# Patient Record
Sex: Female | Born: 1955
Health system: Southern US, Community
[De-identification: ages and names within clinical notes are randomized; demographics above are authoritative.]

## PROBLEM LIST (undated history)

## (undated) DIAGNOSIS — M47816 Spondylosis without myelopathy or radiculopathy, lumbar region: Secondary | ICD-10-CM

## (undated) DIAGNOSIS — E039 Hypothyroidism, unspecified: Secondary | ICD-10-CM

## (undated) DIAGNOSIS — I480 Paroxysmal atrial fibrillation: Secondary | ICD-10-CM

## (undated) DIAGNOSIS — F32A Depression, unspecified: Secondary | ICD-10-CM

## (undated) DIAGNOSIS — E785 Hyperlipidemia, unspecified: Secondary | ICD-10-CM

## (undated) DIAGNOSIS — K219 Gastro-esophageal reflux disease without esophagitis: Secondary | ICD-10-CM

## (undated) DIAGNOSIS — K7460 Unspecified cirrhosis of liver: Secondary | ICD-10-CM

## (undated) DIAGNOSIS — T7840XA Allergy, unspecified, initial encounter: Secondary | ICD-10-CM

## (undated) DIAGNOSIS — K769 Liver disease, unspecified: Secondary | ICD-10-CM

## (undated) DIAGNOSIS — G4730 Sleep apnea, unspecified: Secondary | ICD-10-CM

## (undated) DIAGNOSIS — R7611 Nonspecific reaction to tuberculin skin test without active tuberculosis: Secondary | ICD-10-CM

## (undated) DIAGNOSIS — Z9989 Dependence on other enabling machines and devices: Secondary | ICD-10-CM

## (undated) DIAGNOSIS — I10 Essential (primary) hypertension: Secondary | ICD-10-CM

## (undated) DIAGNOSIS — H269 Unspecified cataract: Secondary | ICD-10-CM

## (undated) DIAGNOSIS — F329 Major depressive disorder, single episode, unspecified: Secondary | ICD-10-CM

## (undated) DIAGNOSIS — I5042 Chronic combined systolic (congestive) and diastolic (congestive) heart failure: Secondary | ICD-10-CM

## (undated) DIAGNOSIS — C50919 Malignant neoplasm of unspecified site of unspecified female breast: Secondary | ICD-10-CM

## (undated) DIAGNOSIS — M1990 Unspecified osteoarthritis, unspecified site: Secondary | ICD-10-CM

## (undated) DIAGNOSIS — I82409 Acute embolism and thrombosis of unspecified deep veins of unspecified lower extremity: Secondary | ICD-10-CM

## (undated) DIAGNOSIS — G4733 Obstructive sleep apnea (adult) (pediatric): Secondary | ICD-10-CM

## (undated) DIAGNOSIS — D649 Anemia, unspecified: Secondary | ICD-10-CM

## (undated) DIAGNOSIS — R5381 Other malaise: Secondary | ICD-10-CM

## (undated) DIAGNOSIS — C569 Malignant neoplasm of unspecified ovary: Secondary | ICD-10-CM

## (undated) DIAGNOSIS — K8020 Calculus of gallbladder without cholecystitis without obstruction: Secondary | ICD-10-CM

## (undated) HISTORY — DX: Calculus of gallbladder without cholecystitis without obstruction: K80.20

## (undated) HISTORY — PX: PERCUTANEOUS LIVER BIOPSY: SUR136

## (undated) HISTORY — DX: Major depressive disorder, single episode, unspecified: F32.9

## (undated) HISTORY — DX: Liver disease, unspecified: K76.9

## (undated) HISTORY — DX: Gastro-esophageal reflux disease without esophagitis: K21.9

## (undated) HISTORY — DX: Dependence on other enabling machines and devices: Z99.89

## (undated) HISTORY — DX: Essential (primary) hypertension: I10

## (undated) HISTORY — PX: CHOLECYSTECTOMY: SHX55

## (undated) HISTORY — DX: Chronic combined systolic (congestive) and diastolic (congestive) heart failure: I50.42

## (undated) HISTORY — PX: BREAST BIOPSY: SHX20

## (undated) HISTORY — PX: TUBAL LIGATION: SHX77

## (undated) HISTORY — DX: Malignant neoplasm of unspecified ovary: C56.9

## (undated) HISTORY — DX: Acute embolism and thrombosis of unspecified deep veins of unspecified lower extremity: I82.409

## (undated) HISTORY — DX: Nonspecific reaction to tuberculin skin test without active tuberculosis: R76.11

## (undated) HISTORY — DX: Hypothyroidism, unspecified: E03.9

## (undated) HISTORY — DX: Sleep apnea, unspecified: G47.30

## (undated) HISTORY — DX: Depression, unspecified: F32.A

## (undated) HISTORY — DX: Paroxysmal atrial fibrillation: I48.0

## (undated) HISTORY — DX: Hyperlipidemia, unspecified: E78.5

## (undated) HISTORY — DX: Unspecified osteoarthritis, unspecified site: M19.90

## (undated) HISTORY — DX: Obstructive sleep apnea (adult) (pediatric): G47.33

## (undated) HISTORY — PX: EYE SURGERY: SHX253

## (undated) HISTORY — PX: PEG TUBE PLACEMENT: SUR1034

## (undated) HISTORY — DX: Unspecified cirrhosis of liver: K74.60

## (undated) HISTORY — PX: FOOT SURGERY: SHX648

## (undated) HISTORY — PX: CATARACT EXTRACTION: SUR2

---

## 2000-05-12 HISTORY — PX: BREAST EXCISIONAL BIOPSY: SUR124

## 2000-11-09 ENCOUNTER — Encounter: Admission: RE | Admit: 2000-11-09 | Discharge: 2000-11-09 | Payer: Self-pay | Admitting: *Deleted

## 2000-11-09 ENCOUNTER — Encounter (INDEPENDENT_AMBULATORY_CARE_PROVIDER_SITE_OTHER): Payer: Self-pay | Admitting: *Deleted

## 2000-11-09 ENCOUNTER — Ambulatory Visit (HOSPITAL_BASED_OUTPATIENT_CLINIC_OR_DEPARTMENT_OTHER): Admission: RE | Admit: 2000-11-09 | Discharge: 2000-11-09 | Payer: Self-pay | Admitting: *Deleted

## 2001-09-10 ENCOUNTER — Other Ambulatory Visit: Admission: RE | Admit: 2001-09-10 | Discharge: 2001-09-10 | Payer: Self-pay | Admitting: Family Medicine

## 2001-09-17 ENCOUNTER — Encounter: Admission: RE | Admit: 2001-09-17 | Discharge: 2001-09-17 | Payer: Self-pay | Admitting: Family Medicine

## 2001-09-17 ENCOUNTER — Encounter: Payer: Self-pay | Admitting: Family Medicine

## 2002-11-16 ENCOUNTER — Other Ambulatory Visit: Admission: RE | Admit: 2002-11-16 | Discharge: 2002-11-16 | Payer: Self-pay | Admitting: Family Medicine

## 2002-11-16 ENCOUNTER — Encounter: Payer: Self-pay | Admitting: Family Medicine

## 2002-11-16 ENCOUNTER — Encounter: Admission: RE | Admit: 2002-11-16 | Discharge: 2002-11-16 | Payer: Self-pay | Admitting: Family Medicine

## 2003-05-13 ENCOUNTER — Encounter: Payer: Self-pay | Admitting: Internal Medicine

## 2003-05-13 LAB — CONVERTED CEMR LAB

## 2003-11-23 ENCOUNTER — Other Ambulatory Visit: Admission: RE | Admit: 2003-11-23 | Discharge: 2003-11-23 | Payer: Self-pay | Admitting: Family Medicine

## 2003-11-23 ENCOUNTER — Encounter: Admission: RE | Admit: 2003-11-23 | Discharge: 2003-11-23 | Payer: Self-pay | Admitting: Family Medicine

## 2003-11-23 ENCOUNTER — Encounter: Payer: Self-pay | Admitting: Internal Medicine

## 2004-06-07 ENCOUNTER — Encounter: Payer: Self-pay | Admitting: Internal Medicine

## 2004-06-19 ENCOUNTER — Ambulatory Visit: Payer: Self-pay | Admitting: Family Medicine

## 2004-07-26 ENCOUNTER — Ambulatory Visit: Payer: Self-pay | Admitting: Family Medicine

## 2004-08-27 ENCOUNTER — Ambulatory Visit: Payer: Self-pay | Admitting: Family Medicine

## 2004-09-02 ENCOUNTER — Ambulatory Visit: Payer: Self-pay | Admitting: Family Medicine

## 2004-09-12 ENCOUNTER — Ambulatory Visit (HOSPITAL_COMMUNITY): Admission: RE | Admit: 2004-09-12 | Discharge: 2004-09-12 | Payer: Self-pay | Admitting: Infectious Diseases

## 2004-11-14 ENCOUNTER — Ambulatory Visit: Payer: Self-pay | Admitting: Family Medicine

## 2004-12-08 ENCOUNTER — Inpatient Hospital Stay (HOSPITAL_COMMUNITY): Admission: EM | Admit: 2004-12-08 | Discharge: 2004-12-10 | Payer: Self-pay | Admitting: Emergency Medicine

## 2004-12-08 ENCOUNTER — Ambulatory Visit: Payer: Self-pay | Admitting: Endocrinology

## 2004-12-12 ENCOUNTER — Ambulatory Visit: Payer: Self-pay | Admitting: Cardiology

## 2004-12-17 ENCOUNTER — Ambulatory Visit: Payer: Self-pay | Admitting: Family Medicine

## 2004-12-17 ENCOUNTER — Ambulatory Visit: Admission: RE | Admit: 2004-12-17 | Discharge: 2004-12-17 | Payer: Self-pay | Admitting: Family Medicine

## 2004-12-17 ENCOUNTER — Ambulatory Visit: Payer: Self-pay | Admitting: Cardiology

## 2004-12-20 ENCOUNTER — Ambulatory Visit: Payer: Self-pay | Admitting: Family Medicine

## 2004-12-23 ENCOUNTER — Ambulatory Visit: Payer: Self-pay | Admitting: Cardiology

## 2004-12-27 ENCOUNTER — Ambulatory Visit: Payer: Self-pay | Admitting: Family Medicine

## 2005-01-07 ENCOUNTER — Ambulatory Visit: Payer: Self-pay | Admitting: Cardiovascular Disease

## 2005-01-20 ENCOUNTER — Ambulatory Visit: Payer: Self-pay | Admitting: Family Medicine

## 2005-01-24 ENCOUNTER — Ambulatory Visit: Payer: Self-pay | Admitting: *Deleted

## 2005-02-13 ENCOUNTER — Ambulatory Visit: Payer: Self-pay | Admitting: Internal Medicine

## 2005-02-13 ENCOUNTER — Ambulatory Visit: Payer: Self-pay | Admitting: Cardiology

## 2005-02-26 ENCOUNTER — Encounter: Admission: RE | Admit: 2005-02-26 | Discharge: 2005-02-26 | Payer: Self-pay | Admitting: Family Medicine

## 2005-04-07 ENCOUNTER — Ambulatory Visit: Payer: Self-pay | Admitting: Cardiology

## 2005-05-06 ENCOUNTER — Ambulatory Visit: Payer: Self-pay | Admitting: Internal Medicine

## 2005-05-15 ENCOUNTER — Ambulatory Visit: Payer: Self-pay | Admitting: Internal Medicine

## 2005-05-19 ENCOUNTER — Ambulatory Visit: Payer: Self-pay | Admitting: Internal Medicine

## 2005-05-29 ENCOUNTER — Ambulatory Visit: Payer: Self-pay | Admitting: Cardiology

## 2005-06-16 ENCOUNTER — Ambulatory Visit: Payer: Self-pay | Admitting: Internal Medicine

## 2005-06-26 ENCOUNTER — Ambulatory Visit: Payer: Self-pay | Admitting: Internal Medicine

## 2005-07-07 ENCOUNTER — Ambulatory Visit: Payer: Self-pay | Admitting: Internal Medicine

## 2005-07-21 ENCOUNTER — Ambulatory Visit: Payer: Self-pay | Admitting: Internal Medicine

## 2005-08-18 ENCOUNTER — Ambulatory Visit: Payer: Self-pay | Admitting: Internal Medicine

## 2005-10-28 ENCOUNTER — Ambulatory Visit: Payer: Self-pay | Admitting: Internal Medicine

## 2005-12-18 ENCOUNTER — Ambulatory Visit: Payer: Self-pay | Admitting: Internal Medicine

## 2006-03-05 ENCOUNTER — Encounter: Admission: RE | Admit: 2006-03-05 | Discharge: 2006-03-05 | Payer: Self-pay | Admitting: Internal Medicine

## 2006-03-05 LAB — HM MAMMOGRAPHY

## 2006-04-28 ENCOUNTER — Ambulatory Visit: Payer: Self-pay | Admitting: Family Medicine

## 2006-04-28 LAB — CONVERTED CEMR LAB
ALT: 50 units/L — ABNORMAL HIGH (ref 0–40)
Potassium: 4 meq/L (ref 3.5–5.1)

## 2006-05-21 ENCOUNTER — Ambulatory Visit: Payer: Self-pay | Admitting: Internal Medicine

## 2006-06-11 ENCOUNTER — Encounter: Admission: RE | Admit: 2006-06-11 | Discharge: 2006-06-11 | Payer: Self-pay | Admitting: Internal Medicine

## 2006-06-11 ENCOUNTER — Encounter (INDEPENDENT_AMBULATORY_CARE_PROVIDER_SITE_OTHER): Payer: Self-pay | Admitting: *Deleted

## 2006-09-10 ENCOUNTER — Ambulatory Visit: Payer: Self-pay | Admitting: Internal Medicine

## 2006-09-10 LAB — CONVERTED CEMR LAB
AST: 51 units/L — ABNORMAL HIGH (ref 0–37)
Albumin: 3.6 g/dL (ref 3.5–5.2)
HDL: 29.4 mg/dL — ABNORMAL LOW (ref >39.0)
Total Bilirubin: 0.7 mg/dL (ref 0.3–1.2)
Total Protein: 6.9 g/dL (ref 6.0–8.3)
VLDL: 27 mg/dL (ref 0–40)

## 2006-09-17 ENCOUNTER — Ambulatory Visit: Payer: Self-pay | Admitting: Internal Medicine

## 2006-09-17 ENCOUNTER — Ambulatory Visit: Payer: Self-pay

## 2006-10-06 ENCOUNTER — Encounter: Payer: Self-pay | Admitting: Internal Medicine

## 2006-10-06 DIAGNOSIS — K219 Gastro-esophageal reflux disease without esophagitis: Secondary | ICD-10-CM

## 2006-10-06 DIAGNOSIS — F329 Major depressive disorder, single episode, unspecified: Secondary | ICD-10-CM

## 2006-10-06 DIAGNOSIS — E785 Hyperlipidemia, unspecified: Secondary | ICD-10-CM | POA: Insufficient documentation

## 2006-10-06 DIAGNOSIS — Z86718 Personal history of other venous thrombosis and embolism: Secondary | ICD-10-CM | POA: Insufficient documentation

## 2006-11-27 ENCOUNTER — Ambulatory Visit: Payer: Self-pay | Admitting: Internal Medicine

## 2006-12-09 ENCOUNTER — Encounter: Payer: Self-pay | Admitting: Internal Medicine

## 2006-12-15 ENCOUNTER — Telehealth: Payer: Self-pay | Admitting: Internal Medicine

## 2006-12-15 LAB — CONVERTED CEMR LAB
AST: 44 units/L — ABNORMAL HIGH (ref 0–37)
Albumin: 4.2 g/dL (ref 3.5–5.2)
Alkaline Phosphatase: 154 units/L — ABNORMAL HIGH (ref 39–117)
BUN: 11 mg/dL (ref 6–23)
Bilirubin, Direct: 0.1 mg/dL (ref 0.0–0.3)
Calcium: 9.2 mg/dL (ref 8.4–10.5)
Chloride: 103 meq/L (ref 96–112)
Cholesterol: 146 mg/dL (ref 0–200)
Creatinine, Ser: 0.84 mg/dL (ref 0.40–1.20)
HDL: 34 mg/dL — ABNORMAL LOW (ref >39)
Indirect Bilirubin: 0.3 mg/dL (ref 0.0–0.9)
LDL Cholesterol: 86 mg/dL (ref 0–99)
Potassium: 4.2 meq/L (ref 3.5–5.3)
Total CHOL/HDL Ratio: 4.3
Vit D, 1,25-Dihydroxy: 14 — ABNORMAL LOW (ref 20–57)

## 2006-12-16 ENCOUNTER — Encounter: Payer: Self-pay | Admitting: Internal Medicine

## 2007-01-04 ENCOUNTER — Ambulatory Visit: Payer: Self-pay | Admitting: Internal Medicine

## 2007-01-12 ENCOUNTER — Telehealth: Payer: Self-pay | Admitting: Internal Medicine

## 2007-01-21 ENCOUNTER — Ambulatory Visit (HOSPITAL_BASED_OUTPATIENT_CLINIC_OR_DEPARTMENT_OTHER): Admission: RE | Admit: 2007-01-21 | Discharge: 2007-01-21 | Payer: Self-pay | Admitting: Internal Medicine

## 2007-01-28 ENCOUNTER — Telehealth: Payer: Self-pay | Admitting: Internal Medicine

## 2007-02-01 ENCOUNTER — Ambulatory Visit: Payer: Self-pay | Admitting: Internal Medicine

## 2007-02-02 ENCOUNTER — Ambulatory Visit: Payer: Self-pay | Admitting: Internal Medicine

## 2007-02-18 ENCOUNTER — Ambulatory Visit: Payer: Self-pay | Admitting: Internal Medicine

## 2007-02-22 LAB — CONVERTED CEMR LAB: TSH: 7.43 microintl units/mL — ABNORMAL HIGH (ref 0.35–5.50)

## 2007-02-23 LAB — CONVERTED CEMR LAB: Vit D, 1,25-Dihydroxy: 27 — ABNORMAL LOW (ref 30–89)

## 2007-03-01 ENCOUNTER — Telehealth: Payer: Self-pay | Admitting: Internal Medicine

## 2007-03-08 ENCOUNTER — Encounter: Admission: RE | Admit: 2007-03-08 | Discharge: 2007-03-08 | Payer: Self-pay | Admitting: Internal Medicine

## 2007-03-18 ENCOUNTER — Telehealth: Payer: Self-pay | Admitting: Internal Medicine

## 2007-03-24 ENCOUNTER — Ambulatory Visit: Payer: Self-pay | Admitting: Internal Medicine

## 2007-03-24 DIAGNOSIS — G4733 Obstructive sleep apnea (adult) (pediatric): Secondary | ICD-10-CM

## 2007-03-24 DIAGNOSIS — E039 Hypothyroidism, unspecified: Secondary | ICD-10-CM | POA: Insufficient documentation

## 2007-03-30 DIAGNOSIS — M129 Arthropathy, unspecified: Secondary | ICD-10-CM | POA: Insufficient documentation

## 2007-04-09 ENCOUNTER — Ambulatory Visit: Payer: Self-pay | Admitting: Internal Medicine

## 2007-04-12 ENCOUNTER — Telehealth: Payer: Self-pay | Admitting: *Deleted

## 2007-04-22 ENCOUNTER — Telehealth: Payer: Self-pay | Admitting: *Deleted

## 2007-04-27 ENCOUNTER — Telehealth: Payer: Self-pay | Admitting: Internal Medicine

## 2007-05-11 ENCOUNTER — Telehealth: Payer: Self-pay | Admitting: Internal Medicine

## 2007-06-07 ENCOUNTER — Telehealth: Payer: Self-pay | Admitting: Internal Medicine

## 2007-06-25 ENCOUNTER — Ambulatory Visit: Payer: Self-pay | Admitting: Internal Medicine

## 2007-06-25 LAB — CONVERTED CEMR LAB
Creatinine, Ser: 0.9 mg/dL (ref 0.4–1.2)
GFR calc Af Amer: 84 mL/min
GFR calc non Af Amer: 70 mL/min
Glucose, Bld: 85 mg/dL (ref 70–99)
Hgb A1c MFr Bld: 6.3 % — ABNORMAL HIGH (ref 4.6–6.0)
Potassium: 4.3 meq/L (ref 3.5–5.1)

## 2007-06-30 ENCOUNTER — Ambulatory Visit: Payer: Self-pay | Admitting: Internal Medicine

## 2007-07-13 ENCOUNTER — Telehealth: Payer: Self-pay | Admitting: Internal Medicine

## 2007-07-26 ENCOUNTER — Ambulatory Visit: Payer: Self-pay | Admitting: Internal Medicine

## 2007-08-09 ENCOUNTER — Telehealth: Payer: Self-pay | Admitting: Internal Medicine

## 2007-08-13 ENCOUNTER — Telehealth: Payer: Self-pay | Admitting: Internal Medicine

## 2007-08-25 ENCOUNTER — Ambulatory Visit: Payer: Self-pay | Admitting: Internal Medicine

## 2007-09-01 ENCOUNTER — Telehealth: Payer: Self-pay | Admitting: *Deleted

## 2007-09-01 LAB — CONVERTED CEMR LAB: TSH: 5.41 microintl units/mL (ref 0.35–5.50)

## 2007-09-06 ENCOUNTER — Telehealth: Payer: Self-pay | Admitting: Internal Medicine

## 2007-09-07 ENCOUNTER — Encounter: Payer: Self-pay | Admitting: Family Medicine

## 2007-09-07 ENCOUNTER — Telehealth: Payer: Self-pay | Admitting: *Deleted

## 2007-09-15 ENCOUNTER — Ambulatory Visit: Payer: Self-pay | Admitting: Internal Medicine

## 2007-10-25 ENCOUNTER — Telehealth: Payer: Self-pay | Admitting: *Deleted

## 2007-11-22 ENCOUNTER — Telehealth: Payer: Self-pay | Admitting: Internal Medicine

## 2007-11-24 ENCOUNTER — Telehealth: Payer: Self-pay | Admitting: Internal Medicine

## 2007-11-24 ENCOUNTER — Telehealth: Payer: Self-pay | Admitting: *Deleted

## 2007-11-24 ENCOUNTER — Ambulatory Visit: Payer: Self-pay | Admitting: Internal Medicine

## 2007-11-24 LAB — CONVERTED CEMR LAB
Blood in Urine, dipstick: NEGATIVE
Glucose, Urine, Semiquant: NEGATIVE
Nitrite: POSITIVE
TSH: 2.48 microintl units/mL (ref 0.35–5.50)

## 2007-11-25 ENCOUNTER — Encounter: Payer: Self-pay | Admitting: Internal Medicine

## 2007-11-26 ENCOUNTER — Telehealth: Payer: Self-pay | Admitting: Internal Medicine

## 2007-12-01 ENCOUNTER — Ambulatory Visit: Payer: Self-pay | Admitting: Internal Medicine

## 2007-12-01 DIAGNOSIS — R05 Cough: Secondary | ICD-10-CM

## 2007-12-06 ENCOUNTER — Ambulatory Visit: Payer: Self-pay | Admitting: Internal Medicine

## 2008-02-09 ENCOUNTER — Telehealth: Payer: Self-pay | Admitting: Internal Medicine

## 2008-03-16 ENCOUNTER — Telehealth: Payer: Self-pay | Admitting: *Deleted

## 2008-05-12 LAB — HM DIABETES EYE EXAM: HM Diabetic Eye Exam: NORMAL

## 2008-05-17 ENCOUNTER — Telehealth: Payer: Self-pay | Admitting: Internal Medicine

## 2008-06-09 ENCOUNTER — Ambulatory Visit: Payer: Self-pay | Admitting: Internal Medicine

## 2008-06-11 ENCOUNTER — Encounter: Payer: Self-pay | Admitting: Internal Medicine

## 2008-06-11 LAB — CONVERTED CEMR LAB
Bilirubin, Direct: 0.1 mg/dL (ref 0.0–0.3)
HDL: 32.1 mg/dL — ABNORMAL LOW (ref >39.0)
LDL Cholesterol: 104 mg/dL — ABNORMAL HIGH (ref 0–99)
TSH: 5.88 microintl units/mL — ABNORMAL HIGH (ref 0.35–5.50)
Total Bilirubin: 0.7 mg/dL (ref 0.3–1.2)
Total CHOL/HDL Ratio: 4.9
Total Protein: 7.4 g/dL (ref 6.0–8.3)

## 2008-06-14 ENCOUNTER — Ambulatory Visit: Payer: Self-pay | Admitting: Internal Medicine

## 2008-06-20 ENCOUNTER — Telehealth: Payer: Self-pay | Admitting: *Deleted

## 2008-06-26 ENCOUNTER — Telehealth: Payer: Self-pay | Admitting: Internal Medicine

## 2008-08-09 ENCOUNTER — Ambulatory Visit: Payer: Self-pay | Admitting: Internal Medicine

## 2008-08-09 LAB — CONVERTED CEMR LAB
ALT: 85 units/L — ABNORMAL HIGH (ref 0–35)
ALT: 78 units/L — ABNORMAL HIGH (ref 0–35)
AST: 90 units/L — ABNORMAL HIGH (ref 0–37)
Alkaline Phosphatase: 231 units/L — ABNORMAL HIGH (ref 39–117)
Anti Nuclear Antibody(ANA): POSITIVE — AB
Bilirubin, Direct: 0.1 mg/dL (ref 0.0–0.3)
Bilirubin, Direct: 0.1 mg/dL (ref 0.0–0.3)
Total Bilirubin: 0.6 mg/dL (ref 0.3–1.2)
Total Bilirubin: 0.4 mg/dL (ref 0.3–1.2)
Total Protein: 7.5 g/dL (ref 6.0–8.3)
Total Protein: 7.4 g/dL (ref 6.0–8.3)

## 2008-08-16 ENCOUNTER — Ambulatory Visit: Payer: Self-pay | Admitting: Internal Medicine

## 2008-08-16 DIAGNOSIS — R5383 Other fatigue: Secondary | ICD-10-CM

## 2008-08-16 DIAGNOSIS — E559 Vitamin D deficiency, unspecified: Secondary | ICD-10-CM | POA: Insufficient documentation

## 2008-08-16 DIAGNOSIS — R5381 Other malaise: Secondary | ICD-10-CM | POA: Insufficient documentation

## 2008-08-16 LAB — CONVERTED CEMR LAB
Basophils Relative: 1.3 % (ref 0.0–3.0)
CO2: 26 meq/L (ref 19–32)
CRP, High Sensitivity: 9 — ABNORMAL HIGH (ref 0.00–5.00)
Ceruloplasmin: 49 mg/dL (ref 21–63)
Chloride: 100 meq/L (ref 96–112)
Eosinophils Absolute: 0.1 10*3/uL (ref 0.0–0.7)
Eosinophils Relative: 1.1 % (ref 0.0–5.0)
GGT: 198 units/L — ABNORMAL HIGH (ref 7–51)
HCT: 37.3 % (ref 36.0–46.0)
INR: 1 (ref 0.8–1.0)
Iron: 50 ug/dL (ref 42–145)
Lymphocytes Relative: 28.4 % (ref 12.0–46.0)
MCHC: 33.7 g/dL (ref 30.0–36.0)
Monocytes Relative: 3 % (ref 3.0–12.0)
Neutro Abs: 4.1 10*3/uL (ref 1.4–7.7)
Platelets: 211 10*3/uL (ref 150.0–400.0)
Prothrombin Time: 11.2 s (ref 10.9–13.3)
RBC: 4.61 M/uL (ref 3.87–5.11)
Sed Rate: 18 mm/hr (ref 0–22)
Transferrin: 376.6 mg/dL — ABNORMAL HIGH (ref 212.0–360.0)

## 2008-08-18 ENCOUNTER — Telehealth: Payer: Self-pay | Admitting: Internal Medicine

## 2008-08-28 ENCOUNTER — Telehealth: Payer: Self-pay | Admitting: Internal Medicine

## 2008-09-20 ENCOUNTER — Telehealth: Payer: Self-pay | Admitting: Internal Medicine

## 2008-09-21 ENCOUNTER — Telehealth: Payer: Self-pay | Admitting: *Deleted

## 2008-09-28 ENCOUNTER — Telehealth: Payer: Self-pay | Admitting: Internal Medicine

## 2008-09-28 DIAGNOSIS — K760 Fatty (change of) liver, not elsewhere classified: Secondary | ICD-10-CM | POA: Insufficient documentation

## 2008-09-28 DIAGNOSIS — R161 Splenomegaly, not elsewhere classified: Secondary | ICD-10-CM

## 2008-09-29 ENCOUNTER — Telehealth: Payer: Self-pay | Admitting: Family Medicine

## 2008-10-04 ENCOUNTER — Ambulatory Visit: Payer: Self-pay | Admitting: Internal Medicine

## 2008-10-04 ENCOUNTER — Ambulatory Visit (HOSPITAL_COMMUNITY): Admission: RE | Admit: 2008-10-04 | Discharge: 2008-10-04 | Payer: Self-pay | Admitting: Internal Medicine

## 2008-10-06 ENCOUNTER — Telehealth: Payer: Self-pay | Admitting: *Deleted

## 2008-10-16 ENCOUNTER — Ambulatory Visit: Payer: Self-pay | Admitting: Internal Medicine

## 2008-10-16 DIAGNOSIS — F430 Acute stress reaction: Secondary | ICD-10-CM | POA: Insufficient documentation

## 2008-10-25 ENCOUNTER — Ambulatory Visit: Payer: Self-pay | Admitting: Licensed Clinical Social Worker

## 2008-11-01 ENCOUNTER — Ambulatory Visit: Payer: Self-pay | Admitting: Licensed Clinical Social Worker

## 2008-11-07 ENCOUNTER — Ambulatory Visit: Payer: Self-pay | Admitting: Internal Medicine

## 2008-11-07 ENCOUNTER — Encounter: Admission: RE | Admit: 2008-11-07 | Discharge: 2008-11-07 | Payer: Self-pay | Admitting: Internal Medicine

## 2008-11-09 ENCOUNTER — Ambulatory Visit: Payer: Self-pay | Admitting: Licensed Clinical Social Worker

## 2008-11-17 ENCOUNTER — Ambulatory Visit: Payer: Self-pay | Admitting: Internal Medicine

## 2008-11-17 LAB — CONVERTED CEMR LAB: Blood Glucose, Fingerstick: 164

## 2008-11-20 LAB — CONVERTED CEMR LAB: TSH: 8.829 microintl units/mL — ABNORMAL HIGH (ref 0.350–4.500)

## 2008-11-22 ENCOUNTER — Ambulatory Visit: Payer: Self-pay | Admitting: Internal Medicine

## 2008-11-22 ENCOUNTER — Ambulatory Visit: Payer: Self-pay | Admitting: Licensed Clinical Social Worker

## 2008-11-29 ENCOUNTER — Ambulatory Visit (HOSPITAL_COMMUNITY): Admission: RE | Admit: 2008-11-29 | Discharge: 2008-11-29 | Payer: Self-pay | Admitting: Internal Medicine

## 2008-11-29 ENCOUNTER — Encounter: Payer: Self-pay | Admitting: Internal Medicine

## 2008-11-29 ENCOUNTER — Encounter (INDEPENDENT_AMBULATORY_CARE_PROVIDER_SITE_OTHER): Payer: Self-pay | Admitting: Interventional Radiology

## 2008-12-12 ENCOUNTER — Telehealth: Payer: Self-pay | Admitting: Internal Medicine

## 2008-12-15 ENCOUNTER — Telehealth: Payer: Self-pay | Admitting: *Deleted

## 2008-12-26 ENCOUNTER — Ambulatory Visit: Payer: Self-pay | Admitting: Internal Medicine

## 2008-12-26 LAB — CONVERTED CEMR LAB
ALT: 47 units/L — ABNORMAL HIGH (ref 0–35)
Albumin: 3.3 g/dL — ABNORMAL LOW (ref 3.5–5.2)
Alkaline Phosphatase: 189 units/L — ABNORMAL HIGH (ref 39–117)
Bilirubin, Direct: 0.1 mg/dL (ref 0.0–0.3)
Total Protein: 7.1 g/dL (ref 6.0–8.3)

## 2009-01-04 ENCOUNTER — Ambulatory Visit: Payer: Self-pay | Admitting: Internal Medicine

## 2009-01-05 ENCOUNTER — Ambulatory Visit: Payer: Self-pay | Admitting: Internal Medicine

## 2009-01-05 LAB — CONVERTED CEMR LAB: Blood Glucose, Fingerstick: 239

## 2009-01-10 ENCOUNTER — Telehealth: Payer: Self-pay | Admitting: *Deleted

## 2009-01-16 ENCOUNTER — Telehealth: Payer: Self-pay | Admitting: *Deleted

## 2009-01-17 ENCOUNTER — Telehealth: Payer: Self-pay | Admitting: *Deleted

## 2009-01-18 ENCOUNTER — Telehealth: Payer: Self-pay | Admitting: Internal Medicine

## 2009-01-26 ENCOUNTER — Ambulatory Visit: Payer: Self-pay | Admitting: Internal Medicine

## 2009-01-26 LAB — CONVERTED CEMR LAB
AST: 88 units/L — ABNORMAL HIGH (ref 0–37)
Albumin: 3.3 g/dL — ABNORMAL LOW (ref 3.5–5.2)
Basophils Absolute: 0 10*3/uL (ref 0.0–0.1)
Bilirubin, Direct: 0.2 mg/dL (ref 0.0–0.3)
Eosinophils Relative: 2.5 % (ref 0.0–5.0)
INR: 1.1 — ABNORMAL HIGH (ref 0.8–1.0)
Iron: 38 ug/dL — ABNORMAL LOW (ref 42–145)
Lymphocytes Relative: 30.5 % (ref 12.0–46.0)
Lymphs Abs: 1.7 10*3/uL (ref 0.7–4.0)
MCV: 84.5 fL (ref 78.0–100.0)
Monocytes Relative: 5.5 % (ref 3.0–12.0)
Neutrophils Relative %: 60.7 % (ref 43.0–77.0)
Prothrombin Time: 11.4 s (ref 9.1–11.7)
RBC: 4.22 M/uL (ref 3.87–5.11)
RDW: 13.9 % (ref 11.5–14.6)
Saturation Ratios: 7.3 % — ABNORMAL LOW (ref 20.0–50.0)

## 2009-01-31 ENCOUNTER — Telehealth: Payer: Self-pay | Admitting: Internal Medicine

## 2009-02-01 ENCOUNTER — Emergency Department (HOSPITAL_COMMUNITY): Admission: EM | Admit: 2009-02-01 | Discharge: 2009-02-01 | Payer: Self-pay | Admitting: Family Medicine

## 2009-02-02 ENCOUNTER — Ambulatory Visit: Payer: Self-pay | Admitting: Internal Medicine

## 2009-02-02 LAB — CONVERTED CEMR LAB: Blood Glucose, Fingerstick: 186

## 2009-02-07 LAB — CONVERTED CEMR LAB: IgG (Immunoglobin G), Serum: 1370 mg/dL (ref 694–1618)

## 2009-02-12 ENCOUNTER — Telehealth: Payer: Self-pay | Admitting: Internal Medicine

## 2009-02-12 ENCOUNTER — Encounter: Payer: Self-pay | Admitting: Internal Medicine

## 2009-02-23 ENCOUNTER — Ambulatory Visit: Payer: Self-pay | Admitting: Endocrinology

## 2009-02-23 DIAGNOSIS — R609 Edema, unspecified: Secondary | ICD-10-CM

## 2009-02-26 ENCOUNTER — Encounter: Payer: Self-pay | Admitting: Internal Medicine

## 2009-03-07 ENCOUNTER — Ambulatory Visit: Payer: Self-pay | Admitting: Internal Medicine

## 2009-03-12 LAB — CONVERTED CEMR LAB
Alkaline Phosphatase: 188 units/L — ABNORMAL HIGH (ref 39–117)
Bilirubin, Direct: 0.1 mg/dL (ref 0.0–0.3)
Total Protein: 7.3 g/dL (ref 6.0–8.3)

## 2009-03-23 ENCOUNTER — Telehealth: Payer: Self-pay | Admitting: Internal Medicine

## 2009-04-02 ENCOUNTER — Telehealth: Payer: Self-pay | Admitting: *Deleted

## 2009-04-03 ENCOUNTER — Ambulatory Visit: Payer: Self-pay | Admitting: Internal Medicine

## 2009-04-11 ENCOUNTER — Telehealth: Payer: Self-pay | Admitting: *Deleted

## 2009-04-18 LAB — CONVERTED CEMR LAB
ALT: 35 units/L (ref 0–35)
AST: 62 units/L — ABNORMAL HIGH (ref 0–37)
Albumin: 3.5 g/dL (ref 3.5–5.2)
TSH: 3.87 microintl units/mL (ref 0.35–5.50)

## 2009-04-27 ENCOUNTER — Telehealth: Payer: Self-pay | Admitting: Internal Medicine

## 2009-05-29 ENCOUNTER — Telehealth: Payer: Self-pay | Admitting: Internal Medicine

## 2009-05-30 ENCOUNTER — Telehealth: Payer: Self-pay | Admitting: Internal Medicine

## 2009-06-01 ENCOUNTER — Ambulatory Visit: Payer: Self-pay | Admitting: Internal Medicine

## 2009-06-01 DIAGNOSIS — F438 Other reactions to severe stress: Secondary | ICD-10-CM

## 2009-06-04 ENCOUNTER — Telehealth: Payer: Self-pay | Admitting: *Deleted

## 2009-06-08 ENCOUNTER — Ambulatory Visit: Payer: Self-pay | Admitting: Internal Medicine

## 2009-06-08 DIAGNOSIS — D509 Iron deficiency anemia, unspecified: Secondary | ICD-10-CM | POA: Insufficient documentation

## 2009-06-11 ENCOUNTER — Ambulatory Visit: Payer: Self-pay | Admitting: Internal Medicine

## 2009-06-11 ENCOUNTER — Telehealth: Payer: Self-pay | Admitting: *Deleted

## 2009-06-11 LAB — CONVERTED CEMR LAB
ALT: 22 units/L (ref 0–35)
AST: 39 units/L — ABNORMAL HIGH (ref 0–37)
Albumin: 4.2 g/dL (ref 3.5–5.2)
Alkaline Phosphatase: 193 units/L — ABNORMAL HIGH (ref 39–117)
Anti Nuclear Antibody(ANA): POSITIVE — AB
BUN: 6 mg/dL (ref 6–23)
Glucose, Bld: 129 mg/dL — ABNORMAL HIGH (ref 70–99)
HDL: 44 mg/dL (ref >39)
Hgb A1c MFr Bld: 6.8 % — ABNORMAL HIGH (ref 4.6–6.1)
Total Bilirubin: 0.8 mg/dL (ref 0.3–1.2)
Total CHOL/HDL Ratio: 5.3
Total Protein: 7.2 g/dL (ref 6.0–8.3)
VLDL: 18 mg/dL (ref 0–40)

## 2009-06-13 ENCOUNTER — Telehealth: Payer: Self-pay | Admitting: Internal Medicine

## 2009-06-13 ENCOUNTER — Encounter: Payer: Self-pay | Admitting: Internal Medicine

## 2009-06-13 ENCOUNTER — Encounter: Payer: Self-pay | Admitting: *Deleted

## 2009-07-02 ENCOUNTER — Ambulatory Visit: Payer: Self-pay | Admitting: Internal Medicine

## 2009-08-01 ENCOUNTER — Telehealth: Payer: Self-pay | Admitting: Internal Medicine

## 2009-08-03 ENCOUNTER — Ambulatory Visit: Payer: Self-pay | Admitting: Internal Medicine

## 2009-09-06 ENCOUNTER — Ambulatory Visit: Payer: Self-pay | Admitting: Internal Medicine

## 2009-09-06 DIAGNOSIS — T50995A Adverse effect of other drugs, medicaments and biological substances, initial encounter: Secondary | ICD-10-CM

## 2009-09-06 LAB — CONVERTED CEMR LAB
BUN: 2 mg/dL — ABNORMAL LOW (ref 6–23)
Blood in Urine, dipstick: NEGATIVE
CO2: 27 meq/L (ref 19–32)
Creatinine, Ser: 0.5 mg/dL (ref 0.4–1.2)
Glucose, Urine, Semiquant: 500
Ketones, urine, test strip: NEGATIVE
Nitrite: NEGATIVE
Potassium: 3.6 meq/L (ref 3.5–5.1)
Protein, U semiquant: NEGATIVE
Specific Gravity, Urine: 1.01

## 2009-09-07 ENCOUNTER — Encounter: Payer: Self-pay | Admitting: Internal Medicine

## 2009-09-10 ENCOUNTER — Telehealth: Payer: Self-pay | Admitting: *Deleted

## 2009-09-14 ENCOUNTER — Telehealth: Payer: Self-pay | Admitting: *Deleted

## 2009-11-19 ENCOUNTER — Telehealth: Payer: Self-pay | Admitting: *Deleted

## 2009-12-27 ENCOUNTER — Encounter: Payer: Self-pay | Admitting: Internal Medicine

## 2010-01-10 ENCOUNTER — Telehealth: Payer: Self-pay | Admitting: *Deleted

## 2010-02-04 ENCOUNTER — Telehealth: Payer: Self-pay | Admitting: *Deleted

## 2010-02-25 ENCOUNTER — Telehealth: Payer: Self-pay | Admitting: *Deleted

## 2010-03-01 ENCOUNTER — Telehealth: Payer: Self-pay | Admitting: *Deleted

## 2010-03-06 ENCOUNTER — Encounter: Payer: Self-pay | Admitting: *Deleted

## 2010-03-06 LAB — CONVERTED CEMR LAB
ALT: 26 units/L
Creatinine, Ser: 0.6 mg/dL
HDL: 43 mg/dL
Potassium: 4.3 meq/L
Triglycerides: 76 mg/dL

## 2010-03-22 ENCOUNTER — Telehealth: Payer: Self-pay | Admitting: Internal Medicine

## 2010-03-26 ENCOUNTER — Encounter: Payer: Self-pay | Admitting: Internal Medicine

## 2010-03-27 ENCOUNTER — Ambulatory Visit: Payer: Self-pay | Admitting: Internal Medicine

## 2010-03-27 LAB — HM DIABETES FOOT EXAM

## 2010-03-27 LAB — CONVERTED CEMR LAB: Vit D, 25-Hydroxy: 26 ng/mL — ABNORMAL LOW (ref 30–89)

## 2010-03-28 ENCOUNTER — Telehealth: Payer: Self-pay | Admitting: *Deleted

## 2010-03-29 ENCOUNTER — Encounter: Payer: Self-pay | Admitting: *Deleted

## 2010-04-02 ENCOUNTER — Telehealth: Payer: Self-pay | Admitting: Internal Medicine

## 2010-04-02 ENCOUNTER — Telehealth: Payer: Self-pay | Admitting: *Deleted

## 2010-04-03 LAB — CONVERTED CEMR LAB
Albumin: 3.8 g/dL (ref 3.5–5.2)
Alkaline Phosphatase: 196 units/L — ABNORMAL HIGH (ref 39–117)
Basophils Absolute: 0.1 10*3/uL (ref 0.0–0.1)
Creatinine,U: 150.3 mg/dL
Eosinophils Absolute: 0.1 10*3/uL (ref 0.0–0.7)
Lymphocytes Relative: 26.1 % (ref 12.0–46.0)
MCHC: 33.8 g/dL (ref 30.0–36.0)
Microalb, Ur: 2.2 mg/dL — ABNORMAL HIGH (ref 0.0–1.9)
Neutrophils Relative %: 63 % (ref 43.0–77.0)
RBC: 4.06 M/uL (ref 3.87–5.11)
RDW: 14.9 % — ABNORMAL HIGH (ref 11.5–14.6)
Total Bilirubin: 0.6 mg/dL (ref 0.3–1.2)

## 2010-05-17 ENCOUNTER — Emergency Department (HOSPITAL_COMMUNITY)
Admission: EM | Admit: 2010-05-17 | Discharge: 2010-05-17 | Payer: Self-pay | Source: Home / Self Care | Admitting: Family Medicine

## 2010-05-27 ENCOUNTER — Encounter: Payer: Self-pay | Admitting: Internal Medicine

## 2010-05-27 ENCOUNTER — Ambulatory Visit
Admission: RE | Admit: 2010-05-27 | Discharge: 2010-05-27 | Payer: Self-pay | Source: Home / Self Care | Attending: Internal Medicine | Admitting: Internal Medicine

## 2010-05-27 ENCOUNTER — Other Ambulatory Visit: Payer: Self-pay | Admitting: Internal Medicine

## 2010-05-27 LAB — HEPATIC FUNCTION PANEL
ALT: 39 U/L — ABNORMAL HIGH (ref 0–35)
AST: 46 U/L — ABNORMAL HIGH (ref 0–37)
Albumin: 3.9 g/dL (ref 3.5–5.2)
Alkaline Phosphatase: 200 U/L — ABNORMAL HIGH (ref 39–117)
Bilirubin, Direct: 0.2 mg/dL (ref 0.0–0.3)
Total Bilirubin: 0.5 mg/dL (ref 0.3–1.2)
Total Protein: 7.9 g/dL (ref 6.0–8.3)

## 2010-06-02 ENCOUNTER — Encounter: Payer: Self-pay | Admitting: Internal Medicine

## 2010-06-03 ENCOUNTER — Encounter: Payer: Self-pay | Admitting: Internal Medicine

## 2010-06-05 ENCOUNTER — Encounter: Admit: 2010-06-05 | Payer: Self-pay | Admitting: Internal Medicine

## 2010-06-07 ENCOUNTER — Telehealth: Payer: Self-pay | Admitting: Internal Medicine

## 2010-06-07 LAB — CONVERTED CEMR LAB
A-1 Antitrypsin, Ser: 185 mg/dL (ref 83–200)
Ceruloplasmin: 44 mg/dL (ref 21–63)

## 2010-06-09 LAB — CONVERTED CEMR LAB
ALT: 63 units/L — ABNORMAL HIGH (ref 0–35)
AST: 62 units/L — ABNORMAL HIGH (ref 0–37)
Albumin: 3.6 g/dL (ref 3.5–5.2)
Alkaline Phosphatase: 185 units/L — ABNORMAL HIGH (ref 39–117)
BUN: 10 mg/dL (ref 6–23)
Basophils Relative: 0 % (ref 0.0–3.0)
CO2: 27 meq/L (ref 19–32)
Chloride: 101 meq/L (ref 96–112)
Creatinine, Ser: 0.7 mg/dL (ref 0.4–1.2)
Eosinophils Absolute: 0.1 10*3/uL (ref 0.0–0.7)
Eosinophils Relative: 0.6 % (ref 0.0–5.0)
GFR calc non Af Amer: 92.45 mL/min (ref >60)
Glucose, Bld: 131 mg/dL — ABNORMAL HIGH (ref 70–99)
Hemoglobin: 12.7 g/dL (ref 12.0–15.0)
Monocytes Relative: 5 % (ref 3.0–12.0)
Platelets: 180 10*3/uL (ref 150.0–400.0)
Sed Rate: 20 mm/hr (ref 0–22)
Sodium: 137 meq/L (ref 135–145)
Total Bilirubin: 1.2 mg/dL (ref 0.3–1.2)
Total Protein: 7.6 g/dL (ref 6.0–8.3)

## 2010-06-11 NOTE — Progress Notes (Signed)
Summary: refill  Phone Note Call from Patient Call back at Home Phone [phone]   Caller: Patient Summary of Call: Pt wants furosemide called into [facility]. Can we call in a 90 days supply? Also does she still need to take her Potassium? BS fasting this am was 125. She has cut back her carbs and sugar intake.  Initial call taken by: Romualdo Bolk, CMA (AAMA),  Sep 10, 2009 11:11 AM  Follow-up for Phone Call        her potassium was low normal and if taking regular lasix  should be taking  daily potassium also.    and follow    Her  hg a1c was elevated. urine culture showed insignificant growth  ok to rx in the med as she directed  and  take potassium Follow-up by: Madelin Headings MD,  Sep 10, 2009 1:15 PM  Additional Follow-up for Phone Call Additional follow up Details #1::        Rx sent to [facility] pharmacy. Pt aware of results. Additional Follow-up by: Romualdo Bolk, CMA (AAMA),  Sep 10, 2009 1:42 PM    Prescriptions: FUROSEMIDE 20 MG TABS (FUROSEMIDE) 1 by mouth once daily as directed  #90 x 0   Entered by:   Romualdo Bolk, CMA (AAMA)   Authorized by:   Madelin Headings MD   Signed by:   Romualdo Bolk, CMA (AAMA) on 09/10/2009   Method used:   Electronically to        [facility]* (retail)       [address]       [address] [place] [employer]       [place], [place]  [address]       Ph: [phone]       Fax: [phone]   RxID:   [id number]   Appended Document: refill Medications Added K-TABS 10 MEQ CR-TABS (POTASSIUM CHLORIDE) 1 by mouth once daily       Pt called back saying that she needs potassium called in.   Clinical Lists Changes  Medications: Added new medication of K-TABS 10 MEQ CR-TABS (POTASSIUM CHLORIDE) 1 by mouth once daily - Signed Rx of K-TABS 10 MEQ CR-TABS (POTASSIUM CHLORIDE) 1 by mouth once daily;  #90 x 0;  Signed;  Entered by: Romualdo Bolk, CMA (AAMA);  Authorized by: Madelin Headings  MD;  Method used: Electronically to [facility]*, [address], [place], [place]  [address], Ph: [phone], Fax: [phone]    Prescriptions: K-TABS 10 MEQ CR-TABS (POTASSIUM CHLORIDE) 1 by mouth once daily  #90 x 0   Entered by:   Romualdo Bolk, CMA (AAMA)   Authorized by:   Madelin Headings MD   Signed by:   Romualdo Bolk, CMA (AAMA) on 09/10/2009   Method used:   Electronically to        [facility]* (retail)       [address]       [address] [place] [employer]       [place], [place]  [address]       Ph: [phone]       Fax: [phone]   RxID:   [id number]

## 2010-06-11 NOTE — Progress Notes (Signed)
Summary: refill request  Phone Note Refill Request Message from:  Fax from Pharmacy on March 22, 2010 9:06 AM  Refills Requested: Medication #1:  BUPROPION HCL 150 MG XR12H-TAB 1 by mouth two times a day Initial call taken by: Kern Reap CMA Duncan Dull),  March 22, 2010 9:06 AM    Prescriptions: BUPROPION HCL 150 MG XR12H-TAB (BUPROPION HCL) 1 by mouth two times a day  #180 x 0   Entered by:   Kern Reap CMA (AAMA)   Authorized by:   Madelin Headings MD   Signed by:   Kern Reap CMA (AAMA) on 03/22/2010   Method used:   Electronically to        [facility]* (retail)       [address]       [address] [place] [facility]       [place], [place]  [address]       Ph: [phone]       Fax: [phone]   RxID:   [id number]

## 2010-06-11 NOTE — Progress Notes (Signed)
Summary: bp going up  Phone Note Call from Patient   Caller: Patient Summary of Call: Pt is concerned that bp is going up. Pt states that on monday bp was 163/80, tues 160/79, then again on tues 163/79, wed 170/79 and then 173/80. Pt hasn't missed any dosage of her bp medication. Initial call taken by: Romualdo Bolk, CMA (AAMA),  May 30, 2009 8:30 AM  Follow-up for Phone Call        Per Dr. Fabian Sharp- Pt can take 1.5 tabs of norvasc. Have pt come in on friday and bring in bp cuff. Pt aware of this. Follow-up by: Romualdo Bolk, CMA (AAMA),  May 30, 2009 10:26 AM

## 2010-06-11 NOTE — Miscellaneous (Signed)
Summary: Immunization Entry   Immunization History:  Hepatitis B Immunization History:    Hepatitis B # 1:  hepb adult (07/01/2001)    Hepatitis B # 2:  hepb adult (08/08/2001)    Hepatitis B # 3:  hepb adult (01/17/2002)

## 2010-06-11 NOTE — Progress Notes (Signed)
Summary: refill on test strips  Phone Note From Pharmacy   Caller: [facility] Reason for Call: Needs renewal Details for Reason: accu-check Aviva test strips Summary of Call: #100. Initial call taken by: Romualdo Bolk, CMA Duncan Dull),  February 25, 2010 12:10 PM  Follow-up for Phone Call        ok x 1  due for lab  and follow up with Dr Juanda Chance ( see letter)  Follow-up by: Madelin Headings MD,  February 25, 2010 5:21 PM  Additional Follow-up for Phone Call Additional follow up Details #1::        Rx sent to pharmacy and pt aware that she needs an appt before next refill. Additional Follow-up by: Romualdo Bolk, CMA (AAMA),  February 26, 2010 9:04 AM    Prescriptions: ACCU-CHEK AVIVA   STRP (GLUCOSE BLOOD) use as directed  #100 Each x 0   Entered by:   Romualdo Bolk, CMA (AAMA)   Authorized by:   Madelin Headings MD   Signed by:   Romualdo Bolk, CMA (AAMA) on 02/26/2010   Method used:   Electronically to        [facility]* (retail)       [address]       [address] [place] [address]       [place], [place]  [address]       Ph: [phone]       Fax: [phone]   RxID:   [id number]

## 2010-06-11 NOTE — Progress Notes (Signed)
Summary: samples  Phone Note Call from Patient Call back at Home Phone [phone]   Caller: Patient Summary of Call: Pt is wanting some samples of onglyza. Initial call taken by: Romualdo Bolk, CMA (AAMA),  November 19, 2009 12:12 PM  Follow-up for Phone Call        ok  Follow-up by: Madelin Headings MD,  November 19, 2009 1:49 PM  Additional Follow-up for Phone Call Additional follow up Details #1::        No samples in the building. Pt aware. Additional Follow-up by: Romualdo Bolk, CMA (AAMA),  November 19, 2009 2:17 PM

## 2010-06-11 NOTE — Miscellaneous (Signed)
Summary: Labs done by [facility]   Clinical Lists Changes  Observations: Added new observation of SGPT (ALT): 26 units/L (03/06/2010 15:21) Added new observation of SGOT (AST): 35 units/L (03/06/2010 15:21) Added new observation of CREATININE: 0.6 mg/dL (16/02/9603 54:09) Added new observation of K SERUM: 4.3 meq/L (03/06/2010 15:21) Added new observation of ALK PHOS: 186 units/L (03/06/2010 15:21) Added new observation of GLU MON POC: 92 mg/dL (81/19/1478 29:56) Added new observation of PLATELETK/UL: 129.0 K/uL (03/06/2010 15:21) Added new observation of HGB: 12.3 g/dL (21/30/8657 84:69) Added new observation of WBC COUNT: 4.0 10*3/microliter (03/06/2010 15:21) Added new observation of LDL: 159 mg/dL (62/95/2841 32:44) Added new observation of HDL: 43 mg/dL (05/14/7251 66:44) Added new observation of TRIGLYC TOT: 76 mg/dL (03/47/4259 56:38) Added new observation of CHOLESTEROL: 217 mg/dL (75/64/3329 51:88)

## 2010-06-11 NOTE — Assessment & Plan Note (Signed)
Summary: leg swelling/ssc   Vital Signs:  Patient profile:   55 year old female Menstrual status:  postmenopausal Weight:      215 pounds Pulse rate:   80 / minute BP sitting:   132 / 62  (right arm) Cuff size:   regular  Vitals Entered By: Romualdo Bolk, CMA (AAMA) (August 03, 2009 4:01 PM)  Serial Vital Signs/Assessments:  Time      Position  BP       Pulse  Resp  Temp     By                     152/80                         Madelin Headings MD                     120/62                         Madelin Headings MD  Comments: reg cuff  sitting  By: Madelin Headings MD  large cuff  sitting By: Madelin Headings MD    CC: Bilateral leg swelling x 1 week   History of Present Illness: [name] comesin today for   for deterioration of sugars BPs and now with some edema of legs with leg discomfort . See phone notes.  Says that she is eating "sugar"  type foods  in middle of night when she wakens.[name] HAd sleep apnea equipment checked   Bp  readings  150 166 range   BG 150-290 range  Increase stressed reported to be a factor. Liver : reportedly doing better and liver not as bad as initially thought.    Thyroid no change in meds .  States that iron gave her a funny taste and coudnt ddrink water anymore .    so stopped th iron   Preventive Screening-Counseling & Management  Alcohol-Tobacco     Alcohol drinks/day: 0     Smoking Status: never  Caffeine-Diet-Exercise     Caffeine use/day: 3     Does Patient Exercise: no  Current Medications (verified): 1)  Omeprazole 20 Mg Cpdr (Omeprazole) .... Take 1 Capsule By Mouth Two Times A Day 2)  Onglyza 5 Mg Tabs (Saxagliptin Hcl) .... Once Daily 3)  Accu-Chek Multiclix Lancets   Misc (Lancets) .... Use As Directed 4)  Accu-Chek Aviva   Strp (Glucose Blood) .... Use As Directed 5)  Bayer Aspirin Ec Low Dose 81 Mg  Tbec (Aspirin) .... Take 1 Tablet By Mouth Once A Day 6)  Cpap 13 Advanced 7)  Synthroid 125 Mcg Tabs (Levothyroxine  Sodium) .[name].. 1 By Mouth Once Daily 8)  Vitamin D 16109 Unit Caps (Ergocalciferol) .[name].. 1 By Mouth Weekly 9)  Alprazolam 0.25 Mg Tabs (Alprazolam) .[name].. 1 By Mouth As Needed Anxiety 10)  Azathioprine 50 Mg Tabs (Azathioprine) .... Take 3 Tabs (150mg ) Once Daily. 11)  Norvasc 5 Mg Tabs (Amlodipine Besylate) .[name].. 1 By Mouth Once Daily 12)  Hydrocodone-Homatropine 5-1.5 Mg/51ml Syrp (Hydrocodone-Homatropine) .[name].. 1-2 Tsp  Q 4-6 Hours As Needed Cough 13)  Venlafaxine Hcl 75 Mg Xr24h-Tab (Venlafaxine Hcl) .[name].. 1 By Mouth Once Daily  Allergies (verified): 1)  Tetracycline Hcl (Tetracycline Hcl) 2)  * Benicar  Past History:  Past medical, surgical, family and social histories (including risk factors) reviewed, and no  changes noted (except as noted below).  Past Medical History: Reviewed history from 02/02/2009 and no changes required. 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 Gilford Rile   Past Surgical History: Reviewed history from 10/04/2008 and no changes required. Breast BX Tubal ligation Cholecystectomy Foot Surgery Eye surgery Cataract Extraction  Past History:  Care Management: Pulmonary: Dr. Maple Hudson Gastroenterology: Juanda Chance Psychiatry: Judithe Modest Endocrinology: Everardo All  Family History: Reviewed history from 06/08/2009 and no changes required. Family History Diabetes : mother (deceased) Family History Hypertension Family History Thyroid disease Family History of Arthritis  Family History of Stroke M 1st degree relative    Mom with ? RA  no  liver autoimmune disease otherwise Family History of Heart Disease: Mother, Father Family History of Colon Cancer:PGF  Social History: Reviewed history from 02/23/2009 and no changes required. Married Never Smoked works [employer]    no etoh Daily Caffeine Use-2 cups  daily Illicit Drug Use - no Patient does not get regular exercise.  Father died this year   Review of Systems  The patient denies anorexia, fever, weight loss, vision loss, decreased hearing, hoarseness, chest pain, syncope, dyspnea on exertion, prolonged cough, abdominal pain, melena, hematochezia, severe indigestion/heartburn, hematuria, transient blindness, abnormal bleeding, enlarged lymph nodes, and angioedema.    Physical Exam  General:  Well-developed,well-nourished,in no acute distress; alert,appropriate and cooperative throughout examination Head:  normocephalic and atraumatic.   Eyes:  vision grossly intact, pupils equal, and pupils round.   Ears:  R ear normal and L ear normal.   Neck:  No deformities, masses, or tenderness noted. Lungs:  Normal respiratory effort, chest expands symmetrically. Lungs are clear to auscultation, no crackles or wheezes.no dullness.   Heart:  Normal rate and regular rhythm. S1 and S2 normal without gallop, murmur, click, rub or other extra sounds.no lifts.   Abdomen:  soft, non-tender, normal bowel sounds, no distention, no masses, no guarding, no rebound tenderness, no hepatomegaly, and no splenomegaly.   Pulses:  pulses intact without delay   Extremities:  1+ left pedal edema and 1+ right pedal edema.   no redness  knee high stockings  ( non diabetic  not compression)  no lesions or cords  Neurologic:  alert & oriented X3 and gait normal.   Skin:  turgor normal, no suspicious lesions, no ecchymoses, and no petechiae.   Cervical Nodes:  No lymphadenopathy noted Psych:  Oriented X3, good eye contact, not depressed appearing, and slightly anxious.     Impression & Recommendations:  Problem # 1:  DIABETES MELLITUS, TYPE II (ICD-250.00) Assessment Deteriorated related to lifestyle  habits  deterioration   haddone very well with weight loss  . is not getting up in night and eating for stress reasons.  Her updated medication list for this problem  includes:    Onglyza 5 Mg Tabs (Saxagliptin hcl) ..... Once daily    Bayer Aspirin Ec Low Dose 81 Mg Tbec (Aspirin) .[name]... Take 1 tablet by mouth once a day    Glimepiride 2 Mg Tabs (Glimepiride) .[name]... Take 1/2 to 1 by mouth once daily for diabetes  Problem # 2:  HYPERTENSION (ICD-401.9) Assessment: Deteriorated vs anxiety   says ok up until a few days ago Her updated medication list for this problem includes:    Norvasc 5 Mg Tabs (Amlodipine besylate) .[name]... 1 by mouth  once daily    Furosemide 20 Mg Tabs (Furosemide) .[name]... 1 by mouth once daily as directed  Problem # 3:  EDEMA (ICD-782.3) Assessment: New  bilaterall   disc dietary changes elevations and  intensification of   Need to lose weight again Her updated medication list for this problem includes:.    Furosemide 20 Mg Tabs (Furosemide) .[name]... 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 1 by mouth once daily as directed  Problem # 6:  MORBID OBESITY (ICD-278.01) Assessment: Deteriorated  Problem # 7:  IRON DEFICIENCY (ICD-280.9) not that bad slighlty low IBC      ok to stop the iron if it helps her avoid sweet drinks and lose weight  Complete Medication List: 1)  Omeprazole 20 Mg Cpdr (Omeprazole) .... Take 1 capsule by mouth two times a day 2)  Onglyza 5 Mg Tabs (Saxagliptin hcl) .... Once daily 3)  Accu-chek Multiclix Lancets Misc (Lancets) .... Use as directed 4)  Accu-chek Aviva Strp (Glucose blood) .... Use as directed 5)  Bayer Aspirin Ec Low Dose 81 Mg Tbec (Aspirin) .... Take 1 tablet by mouth once a day 6)  Cpap 13 Advanced  7)  Synthroid 125 Mcg Tabs (Levothyroxine sodium)  .[name].. 1 by mouth once daily 8)  Vitamin D 60454 Unit Caps (Ergocalciferol) .[name].. 1 by mouth weekly 9)  Alprazolam 0.25 Mg Tabs (Alprazolam) .[name].. 1 by mouth as needed anxiety 10)  Azathioprine 50 Mg Tabs (Azathioprine) .... Take 3 tabs (150mg ) once daily. 11)  Norvasc 5 Mg Tabs (Amlodipine besylate) .[name].. 1 by mouth once daily 12)  Hydrocodone-homatropine 5-1.5 Mg/66ml Syrp (Hydrocodone-homatropine) .[name].. 1-2 tsp  q 4-6 hours as needed cough 13)  Venlafaxine Hcl 75 Mg Xr24h-tab (Venlafaxine hcl) .[name].. 1 by mouth once daily 14)  Glimepiride 2 Mg Tabs (Glimepiride) .... Take 1/2 to 1 by mouth once daily for diabetes 15)  Furosemide 20 Mg Tabs (Furosemide) .[name].. 1 by mouth once daily as directed  Patient Instructions: 1)  stop  eating at night  2)  decrease sugars and salts  this is causing weight gain and swelling  3)  take lasix once daily for 3-7 days    to see if  swelling goes down.   4)  (Begin glimepramide  1/2 by mouth once daily  if bg over  200  after stopping eating. ) 5)  call next week with readings  6)  rov in 2 weeks or as needed. Prescriptions: FUROSEMIDE 20 MG TABS (FUROSEMIDE) 1 by mouth once daily as directed  #20 x 0   Entered and Authorized by:   Madelin Headings MD   Signed by:   Madelin Headings MD on 08/03/2009   Method used:   Electronically to        [facility] [phone]* (retail)       [address]       [place], [place]  [address]       Ph: [phone]       Fax: [phone]   RxID:   [id number] GLIMEPIRIDE 2 MG TABS (GLIMEPIRIDE) take 1/2 to 1 by mouth once daily for diabetes  #30 x 1   Entered and Authorized by:  Madelin Headings MD   Signed by:   Madelin Headings MD on 08/03/2009   Method used:   Electronically to        [facility] [phone]* (retail)       [address]       [place], [place]  [address]       Ph: [phone]       Fax: [phone]   RxID:   [id number]

## 2010-06-11 NOTE — Progress Notes (Signed)
Summary: Pt req med check appt for 3:45 or later. Can not come in earlier  Phone Note Call from Patient Call back at [phone] work    Caller: Patient Summary of Call: Pt called and is req a med check appt for 3:45 or later. Pt can not come in any sooner.  Pls advise.    Initial call taken by: Lucy Antigua,  March 01, 2010 9:54 AM  Follow-up for Phone Call        Pt aware and appt made. Follow-up by: Romualdo Bolk, CMA (AAMA),  March 01, 2010 3:21 PM

## 2010-06-11 NOTE — Progress Notes (Signed)
Summary: lab orders  Phone Note Call from Patient Call back at Home Phone [phone]   Caller: Patient Summary of Call: Pt is coming in on friday pm for labs that need to be done by Dr. Juanda Chance. Do you want any labs as well? Initial call taken by: Romualdo Bolk, CMA (AAMA),  May 29, 2009 3:10 PM  Follow-up for Phone Call        Per Dr. Fabian Sharp- pt needs to have hga1c, bmp, tsh, lipids and what Dr. Juanda Chance needs (lft's). Pt aware and will get this done when she comes in on Friday. Follow-up by: Romualdo Bolk, CMA (AAMA),  May 30, 2009 10:28 AM

## 2010-06-11 NOTE — Letter (Signed)
Summary: Need to schedule Appt!  [facility]  [address] [place], [place] [address]   Phone: [phone]  Fax: [phone]                December 27, 2009 MRN: [id number]    [name] [address] [place], [place]  [address]    Dear [name],  Our office has tried to contact you on several occasions to remind you that it is time for a follow up appointment as well as labwork. Unfortunately, it appears that an appointment has yet to be made. It is very important that we see you periodically and that you have your labwork completed on a routine basis due to your medical condition. Please call our office at [phone] as soon as possible to set up an appointment date and to have your labwork completed. We look forward to continuing participation in your health care.  Sincerely,     Hart Carwin, MD   Appended Document: Need to schedule Appt! ---- 12/13/2009 1:39 PM, Karen Kitchens Nelson-Smith CMA (AAMA) wrote: did Patient schedule f/u visit??? need to enter lft's in idx before appointment  too!!! called Patient.  ---- 12/19/2009 12:07 PM, Karen Kitchens Nelson-Smith CMA (AAMA) wrote: lm with female for Patient to call back  ---- 12/21/2009 9:09 AM, Karen Kitchens Nelson-Smith CMA (AAMA) wrote: lm to call back....  Appended Document: Need to schedule Appt! Letter mailed to patient.  Appended Document: Need to schedule Appt! Dr Juanda Chance advised patient has not scheduled an appointment to date.

## 2010-06-11 NOTE — Assessment & Plan Note (Signed)
Summary: follow up/ssc   Vital Signs:  Patient profile:   55 year old female Menstrual status:  postmenopausal Weight:      211 pounds Pulse rate:   88 / minute BP sitting:   160 / 80  (left arm) Cuff size:   regular  Vitals Entered By: Romualdo Bolk, CMA (AAMA) (March 27, 2010 3:48 PM)  Serial Vital Signs/Assessments:  Time      Position  BP       Pulse  Resp  Temp     By                     148/78                         Madelin Headings MD  CC: Follow-up visit and needs to get labs done. Pt is having alot fatigue, sore throat and coughing. That started this am., Hypertension Management   History of Present Illness: [name] comes in today for follow-up of multiple medical problems. she's not been here since April  and appears to have not seen her other specialists. Most recently she is doing better had seen Dr. Everardo All for her diabetes but is coming back for care as she is doing better.  DM :  she feels the uncle Duwayne Heck is helping and her morning sugars are 9to 120 at the most. She has had an occasional low blood sugar. When she has skipped breakfast accidentally. she denies vision change or numbness in her feet. No recent check HT:  her blood pressure tends to run 140 on the top and normal diastolic. She is on Norvasc. She had side effects with ace inhibitors and Benicar. She is also on Lasix and potassium supplement.  Liver disease:  she's not been back to see Dr. Juanda Chance. As recommended and is not taking the azathioprine because of no refills. The liver tests on her health screening from her job are normal except for the alkaline phosphatase.  Anxiety  depression: doing fairly well considering multiple stresses. Vit d Defic:  still taking high-dose vitamin D unclear when last level was.  need referral to nutritionist to do one-on-one nutritional counseling. She has been better with decreasing her sugar recently.  Today she has a nonproductive cough without fever  wheezing or shortness of breath.  Hypertension History:      She denies headache, chest pain, palpitations, dyspnea with exertion, orthopnea, PND, peripheral edema, visual symptoms, neurologic problems, syncope, and side effects from treatment.  She notes no problems with any antihypertensive medication side effects.        Positive major cardiovascular risk factors include female age 58 years old or older, diabetes, hyperlipidemia, and hypertension.  Negative major cardiovascular risk factors include non-tobacco-user status.      Preventive Screening-Counseling & Management  Alcohol-Tobacco     Alcohol drinks/day: 0     Smoking Status: never  Caffeine-Diet-Exercise     Caffeine use/day: 3     Does Patient Exercise: no  Current Medications (verified): 1)  Omeprazole 20 Mg Cpdr (Omeprazole) .... Take 1 Capsule By Mouth Two Times A Day 2)  Onglyza 5 Mg Tabs (Saxagliptin Hcl) .... Once Daily 3)  Accu-Chek Multiclix Lancets   Misc (Lancets) .... Use As Directed 4)  Accu-Chek Aviva   Strp (Glucose Blood) .... Use As Directed 5)  Bayer Aspirin Ec Low Dose 81 Mg  Tbec (Aspirin) .... Take 1 Tablet By Mouth  Once A Day 6)  Cpap 13 Advanced 7)  Synthroid 125 Mcg Tabs (Levothyroxine Sodium) .[name].. 1 By Mouth Once Daily 8)  Vitamin D 13086 Unit Caps (Ergocalciferol) .[name].. 1 By Mouth Weekly 9)  Alprazolam 0.25 Mg Tabs (Alprazolam) .[name].. 1 By Mouth As Needed Anxiety 10)  Azathioprine 50 Mg Tabs (Azathioprine) .... Take 3 Tabs (150mg ) Once Daily. 11)  Norvasc 5 Mg Tabs (Amlodipine Besylate) .[name].. 1 By Mouth Once Daily 12)  Furosemide 20 Mg Tabs (Furosemide) .[name].. 1 By Mouth Once Daily As Directed 13)  Glimepiride 2 Mg Tabs (Glimepiride) .[name].. 1 By Mouth Once Daily or As Directed Fdor Diabetes 14)  Lexapro 10 Mg Tabs (Escitalopram Oxalate) .[name].. 1 By Mouth Once Daily 15)  Bupropion Hcl 150 Mg Xr12h-Tab (Bupropion Hcl) .[name].. 1 By Mouth Two Times A Day  Allergies (verified): 1)  Tetracycline Hcl  (Tetracycline Hcl) 2)  * Benicar 3)  * Effexor  Past History:  Past medical, surgical, family and social histories (including risk factors) reviewed, and no changes noted (except as noted below).  Past Medical History: Reviewed history from 02/02/2009 and no changes required. 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 Gilford Rile   Past Surgical History: Reviewed history from 10/04/2008 and no changes required. Breast BX Tubal ligation Cholecystectomy Foot Surgery Eye surgery Cataract Extraction  Past History:  Care Management: Pulmonary: Dr. Maple Hudson Gastroenterology: Juanda Chance Psychiatry: Judithe Modest Endocrinology: Everardo All Nutrionist: Alleen Borne- Fax number [phone]  Family History: Reviewed history from 06/08/2009 and no changes required. Family History Diabetes : mother (deceased) Family History Hypertension Family History Thyroid disease Family History of Arthritis  Family History of Stroke M 1st degree relative    Mom with ? RA  no  liver autoimmune disease otherwise Family History of Heart Disease: Mother, Father Family History of Colon Cancer:PGF  Social History: Reviewed history from 02/23/2009 and no changes required. Married Never Smoked works [employer]    no etoh Daily Caffeine Use-2 cups daily Illicit Drug Use - no Patient does not get regular exercise.  Father died this year   Review of Systems  The patient denies anorexia, fever, weight loss, weight gain, vision loss, decreased hearing, hoarseness, syncope, hemoptysis, melena, hematochezia, muscle weakness, difficulty walking, abnormal bleeding, enlarged lymph nodes, and angioedema.    Physical Exam  General:  Well-developed,well-nourished,in no acute distress; alert,appropriate and cooperative throughout examination Head:   normocephalic and atraumatic.   Eyes:  vision grossly intact.   Neck:  No deformities, masses, or tenderness noted. Lungs:  Normal respiratory effort, chest expands symmetrically. Lungs are clear to auscultation, no crackles or wheezes.no dullness.   Heart:  Normal rate and regular rhythm. S1 and S2 normal without gallop, murmur, click, rub or other extra sounds.no lifts.   Abdomen:  soft, non-tender, normal bowel sounds, no distention, and no hepatomegaly.   Pulses:  pulses intact without delay   Extremities:  trace left pedal edema, trace right pedal edema, and 1+ right pedal edema.   Neurologic:  alert & oriented X3, sensation intact to light touch, gait normal, and DTRs symmetrical and normal.   Skin:  turgor normal and non  icteric    Cervical Nodes:  No lymphadenopathy noted Psych:  Oriented X3, normally interactive, good eye contact, and slightly anxious.    Diabetes Management Exam:    Foot Exam (  with socks and/or shoes not present):       Sensory-Monofilament:          Left foot: normal          Right foot: normal       Nails:          Left foot: absent great toe nail          Right foot: absent great toe nail laboratory studies from health screening reviewed  Impression & Recommendations:  Problem # 1:  DIABETES MELLITUS, TYPE II (ICD-250.00) due for hemoglobin A-1 C testing her blood sugar on health screening was 92 Her updated medication list for this problem includes:    Onglyza 5 Mg Tabs (Saxagliptin hcl) ..... Once daily    Bayer Aspirin Ec Low Dose 81 Mg Tbec (Aspirin) .[name]... Take 1 tablet by mouth once a day    Glimepiride 2 Mg Tabs (Glimepiride) .[name]... 1 by mouth once daily or as directed fdor diabetes  Orders: Venipuncture (16109) TLB-A1C / Hgb A1C (Glycohemoglobin) (83036-A1C) TLB-Microalbumin/Creat Ratio, Urine (82043-MALB)  Problem # 2:  HYPERTENSION (ICD-401.9) not in control   had side effects with ace inhibitors and Benicar.   Will increase her Norvascand   follow-up may need to try an arb  again Her updated medication list for this problem includes:    Norvasc 5 Mg Tabs (Amlodipine besylate) .[name]... 1 1/2  by mouth once daily    Furosemide 20 Mg Tabs (Furosemide) .[name]... 1 by mouth once daily as directed  Problem # 3:  LIVER FUNCTION TESTS, ABNORMAL (ICD-794.8) liver disease transaminase as or better, but alkaline phosphatase elevated.  Need to get back with Dr. Juanda Chance. Will repeat today with a GGT Orders: Venipuncture (60454) TLB-Hepatic/Liver Function Pnl (80076-HEPATIC) TLB-GGT (Gamma GT) (82977-GGT)  Problem # 4:  MORBID OBESITY (ICD-278.01) Assessment: Unchanged  Problem # 5:  COUGH (ICD-786.2) Assessment: Comment Only seems to be a new respiratory infection today unremarkable exam  Problem # 6:  UNSPECIFIED VITAMIN D DEFICIENCY (ICD-268.9) on high-dose ...need level checked  Complete Medication List: 1)  Omeprazole 20 Mg Cpdr (Omeprazole) .... Take 1 capsule by mouth two times a day 2)  Onglyza 5 Mg Tabs (Saxagliptin hcl) .... Once daily 3)  Accu-chek Multiclix Lancets Misc (Lancets) .... Use as directed 4)  Accu-chek Aviva Strp (Glucose blood) .... Use as directed 5)  Bayer Aspirin Ec Low Dose 81 Mg Tbec (Aspirin) .... Take 1 tablet by mouth once a day 6)  Cpap 13 Advanced  7)  Synthroid 125 Mcg Tabs (Levothyroxine sodium) .[name].. 1 by mouth once daily 8)  Vitamin D 09811 Unit Caps (Ergocalciferol) .[name].. 1 by mouth weekly 9)  Alprazolam 0.25 Mg Tabs (Alprazolam) .[name].. 1 by mouth as needed anxiety 10)  Azathioprine 50 Mg Tabs (Azathioprine) .... Take 3 tabs (150mg ) once daily. 11)  Norvasc 5 Mg Tabs (Amlodipine besylate) .[name].. 1 1/2  by mouth once daily 12)  Furosemide 20 Mg Tabs (Furosemide) .[name].. 1 by mouth once daily as directed 13)  Glimepiride 2 Mg Tabs (Glimepiride) .[name].. 1 by mouth once daily or as directed fdor diabetes 14)  Lexapro 10 Mg Tabs (Escitalopram oxalate) .[name].. 1 by mouth once daily 15)  Bupropion Hcl 150 Mg Xr12h-tab  (Bupropion hcl) .[name].. 1 by mouth two times a day  Other Orders: Hepatitis A Vaccine (Adult Dose) [phone]) Admin 1st Vaccine (29562) T-Vitamin D (25-Hydroxy) [phone]) TLB-CBC Platelet - w/Differential (85025-CBCD)  Hypertension Assessment/Plan:      The patient's hypertensive risk  group is category C: Target organ damage and/or diabetes.  Her calculated 10 year risk of coronary heart disease is > 32 %.  Today's blood pressure is 160/80.  Her blood pressure goal is < 140/90.  Patient Instructions: 1)  Check BP 2)  Follow up in 3 months or if needed 3)  Get Labs to Dr. Juanda Chance labs and follow up 4)  You will be informed of lab results when available.  Prescriptions: NORVASC 5 MG TABS (AMLODIPINE BESYLATE) 1 1/2  by mouth once daily  #45 x 6   Entered by:   Romualdo Bolk, CMA (AAMA)   Authorized by:   Madelin Headings MD   Signed by:   Romualdo Bolk, CMA (AAMA) on 03/27/2010   Method used:   Electronically to        [facility]* (retail)       [address]       [address] Shipping/mailing       [place], [place]  [address]       Ph: [phone]       Fax: [phone]   RxID:   [id number]    Orders Added: 1)  Hepatitis A Vaccine (Adult Dose) [90632] 2)  Admin 1st Vaccine [90471] 3)  Venipuncture [35009] 4)  TLB-Hepatic/Liver Function Pnl [80076-HEPATIC] 5)  TLB-A1C / Hgb A1C (Glycohemoglobin) [83036-A1C] 6)  TLB-GGT (Gamma GT) [82977-GGT] 7)  T-Vitamin D (25-Hydroxy) [38182-99371] 8)  TLB-Microalbumin/Creat Ratio, Urine [82043-MALB] 9)  TLB-CBC Platelet - w/Differential [85025-CBCD] 10)  Est. Patient Level IV [69678]   Immunizations Administered:  Hepatitis A Vaccine # 2:    Vaccine Type: HepA    Site: right deltoid    Mfr: GlaxoSmithKline    Dose: 1.0 ml    Route: IM    Given by: Romualdo Bolk, CMA (AAMA)    Exp. Date: 04/10/2012    Lot #: [id number]   Immunizations Administered:  Hepatitis A Vaccine # 2:    Vaccine  Type: HepA    Site: right deltoid    Mfr: GlaxoSmithKline    Dose: 1.0 ml    Route: IM    Given by: Romualdo Bolk, CMA (AAMA)    Exp. Date: 04/10/2012    Lot #: [id number]

## 2010-06-11 NOTE — Assessment & Plan Note (Signed)
Summary: follow up on bp/ssc   Vital Signs:  Patient profile:   55 year old female Menstrual status:  postmenopausal Weight:      209 pounds Pulse rate:   78 / minute BP sitting:   142 / 80  (left arm) Cuff size:   regular  Vitals Entered By: Romualdo Bolk, CMA (AAMA) (June 01, 2009 3:50 PM)  Serial Vital Signs/Assessments:  Time      Position  BP       Pulse  Resp  Temp     By 3:53 PM             144/80                         Romualdo Bolk, CMA (AAMA)                     128/70                         Madelin Headings MD  Comments: Pt's machine.  By: Madelin Headings MD   CC: Follow-up visit on BP, Hypertension Management   History of Present Illness: [name] comesin today for   because had increased Blood pressure  when got up dizzy one day.  See phone notes  BG was good but BP was 163   .[name] once was normall. Has been losing weight by cutting out evening snack. Still does sweet tea and one coke a day.  Mood:  Actually feels pretty well. Home situation got better with daughter  and her family moving out after x mas.  NO vision or numbness or other change in med clinical status.  LIVER disease: no change in meds .  no bleeding.  no swelling.  Hypertension History:      She complains of dyspnea with exertion, but denies headache, chest pain, palpitations, orthopnea, PND, peripheral edema, visual symptoms, neurologic problems, syncope, and side effects from treatment.  She notes no problems with any antihypertensive medication side effects.        Positive major cardiovascular risk factors include female age 79 years old or older, diabetes, hyperlipidemia, and hypertension.  Negative major cardiovascular risk factors include non-tobacco-user status.      Preventive Screening-Counseling & Management  Alcohol-Tobacco     Alcohol drinks/day: 0     Smoking Status: never  Caffeine-Diet-Exercise     Caffeine use/day: 3     Does Patient Exercise: no  Current  Medications (verified): 1)  Bupropion Hcl 150 Mg  Tb12 (Bupropion Hcl) .[name].. 1 By Mouth Once Daily 2)  Omeprazole 20 Mg Cpdr (Omeprazole) .... Take 1 Capsule By Mouth Two Times A Day 3)  Onglyza 5 Mg Tabs (Saxagliptin Hcl) .... Once Daily 4)  Accu-Chek Multiclix Lancets   Misc (Lancets) .... Use As Directed 5)  Accu-Chek Aviva   Strp (Glucose Blood) .... Use As Directed 6)  Bayer Aspirin Ec Low Dose 81 Mg  Tbec (Aspirin) .... Take 1 Tablet By Mouth Once A Day 7)  Cpap 13 Advanced 8)  Synthroid 125 Mcg Tabs (Levothyroxine Sodium) .[name].. 1 By Mouth Once Daily 9)  Vitamin D 09811 Unit Caps (Ergocalciferol) .[name].. 1 By Mouth Weekly 10)  Alprazolam 0.25 Mg Tabs (Alprazolam) .[name].. 1 By Mouth As Needed Anxiety 11)  Azathioprine 50 Mg Tabs (Azathioprine) .... Take 3 Tabs (150mg ) Once Daily. 12)  Lexapro 10 Mg Tabs (Escitalopram  Oxalate) .[name].. 1 By Mouth Once Daily  As Directed 13)  Norvasc 5 Mg Tabs (Amlodipine Besylate) .[name].. 1 By Mouth Once Daily 14)  Hydrocodone-Homatropine 5-1.5 Mg/39ml Syrp (Hydrocodone-Homatropine) .[name].. 1-2 Tsp  Q 4-6 Hours As Needed Cough  Allergies (verified): 1)  Tetracycline Hcl (Tetracycline Hcl) 2)  * Benicar  Past History:  Past medical, surgical, family and social histories (including risk factors) reviewed, and no changes noted (except as noted below).  Past Medical History: Reviewed history from 02/02/2009 and no changes required. 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 Gilford Rile   Past Surgical History: Reviewed history from 10/04/2008 and no changes required. Breast BX Tubal ligation Cholecystectomy Foot Surgery Eye surgery Cataract Extraction  Past History:  Care Management: Pulmonary: Dr. Maple Hudson Gastroenterology: Juanda Chance Psychiatry: Judithe Modest Endocrinology: Everardo All  Family History: Reviewed history  from 02/23/2009 and no changes required. Family History Diabetes : mother (deceased) Family History Hypertension Family History Thyroid disease Family History of Arthritis  Family History of Stroke M 1st degree relative    Mom with ? RA  no  liver autoimmune disease otherwise Family History of Heart Disease: Mother, Father  Social History: Reviewed history from 02/23/2009 and no changes required. Married Never Smoked works [employer]    no etoh Daily Caffeine Use-2 cups daily Illicit Drug Use - no Patient does not get regular exercise.  Father died this year   Review of Systems  The patient denies anorexia, fever, weight gain, vision loss, decreased hearing, chest pain, syncope, dyspnea on exertion, peripheral edema, prolonged cough, headaches, hemoptysis, abdominal pain, melena, hematochezia, severe indigestion/heartburn, transient blindness, difficulty walking, depression, unusual weight change, abnormal bleeding, enlarged lymph nodes, and angioedema.         gets ocass cough with laughing or stress. sleeps on 2-3 pillow for comfort "her whole  life"  no PND. no increasing edema.    Physical Exam  General:  Well-developed,well-nourished,in no acute distress; alert,appropriate and cooperative throughout examination Head:  normocephalic and atraumatic.   Eyes:  vision grossly intact.   Ears:  no external deformities.   Mouth:  pharynx pink and moist.   Neck:  No deformities, masses, or tenderness noted. Lungs:  Normal respiratory effort, chest expands symmetrically. Lungs are clear to auscultation, no crackles or wheezes. Heart:  Normal rate and regular rhythm. S1 and S2 normal without gallop, murmur, click, rub or other extra sounds. Abdomen:  soft, non-tender, normal bowel sounds, no guarding, no rebound tenderness, no splenomegaly, and hepatomegaly.   Pulses:  pulses intact without delay   Extremities:  no clubbing cyanosis or edema  Neurologic:  non focal   grossly   gait normal Skin:  turgor normal, color normal, tanned look  on arms no ecchymoses, and no petechiae.   Cervical Nodes:  No lymphadenopathy noted Psych:  Oriented X3, normally interactive, good eye contact, not anxious appearing, and not depressed appearing.      Impression & Recommendations:  Problem # 1:  HYPERTENSION (ICD-401.9) elevated readings at am at work   ? cause  had been good.  certainly some external stressed coudl be related  but unclear whys he elt this way. Because normal reading in office wil monitor and decide on intervention depending on results .   Ok to take a xanax as needed .  bnefit much more than  risk.  Her meds have been rearranged cause of her liver disease  but not that recent. The following medications were removed from the medication list:    Metoprolol Succinate 100 Mg Xr24h-tab (Metoprolol succinate) .[name]... 1 by mouth once daily Her updated medication list for this problem includes:    Norvasc 5 Mg Tabs (Amlodipine besylate) .[name]... 1 by mouth once daily  Problem # 2:  DIABETES MELLITUS, TYPE II (ICD-250.00) Assessment: Improved by hx   weight loss helpful Her updated medication list for this problem includes:    Onglyza 5 Mg Tabs (Saxagliptin hcl) ..... Once daily    Bayer Aspirin Ec Low Dose 81 Mg Tbec (Aspirin) .[name]... Take 1 tablet by mouth once a day  Problem # 3:  ANXIETY, SITUATIONAL (ICD-308.3) coping well    disc use of as needed xanax ok .    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 1 by mouth once daily  Problem # 5:  OBSTRUCTIVE SLEEP APNEA (ICD-327.23) doing well .  continue weight loss.   Complete Medication List: 1)  Bupropion Hcl 150 Mg Tb12 (Bupropion hcl) .[name].. 1 by mouth once daily 2)  Omeprazole 20 Mg Cpdr (Omeprazole) .... Take 1 capsule by mouth two times a  day 3)  Onglyza 5 Mg Tabs (Saxagliptin hcl) .... Once daily 4)  Accu-chek Multiclix Lancets Misc (Lancets) .... Use as directed 5)  Accu-chek Aviva Strp (Glucose blood) .... Use as directed 6)  Bayer Aspirin Ec Low Dose 81 Mg Tbec (Aspirin) .... Take 1 tablet by mouth once a day 7)  Cpap 13 Advanced  8)  Synthroid 125 Mcg Tabs (Levothyroxine sodium) .[name].. 1 by mouth once daily 9)  Vitamin D 16109 Unit Caps (Ergocalciferol) .[name].. 1 by mouth weekly 10)  Alprazolam 0.25 Mg Tabs (Alprazolam) .[name].. 1 by mouth as needed anxiety 11)  Azathioprine 50 Mg Tabs (Azathioprine) .... Take 3 tabs (150mg ) once daily. 12)  Lexapro 10 Mg Tabs (Escitalopram oxalate) .[name].. 1 by mouth once daily  as directed 13)  Norvasc 5 Mg Tabs (Amlodipine besylate) .[name].. 1 by mouth once daily 14)  Hydrocodone-homatropine 5-1.5 Mg/74ml Syrp (Hydrocodone-homatropine) .[name].. 1-2 tsp  q 4-6 hours as needed cough  Hypertension Assessment/Plan:      The patient's hypertensive risk group is category C: Target organ damage and/or diabetes.  Her calculated 10 year risk of coronary heart disease is > 32 %.  Today's blood pressure is 142/80.  Her blood pressure goal is < 140/90.  Patient Instructions: 1)  youo BP readings are good in the office . 2)  You will be informed of lab results when available.  3)  then make a plan. 4)  Check your  Bp readings  with large cuff  in the mornings.   5)  call in readings after 2 weeks . 6)  we mayincrease norvasc to 7.5   if needed. but for now no change.  7)  Ok to take alprazolam three times a day as needed anxiety circumstances.    Labs not back by time of appt soe will call about this.

## 2010-06-11 NOTE — Progress Notes (Signed)
Summary: ? hgb  Phone Note Call from Patient Call back at Home Phone [phone]   Caller: Patient Summary of Call: Pt was told by Dr. Juanda Chance that her iron was low. She is wanting to know what your opinion is about this? Should she be taking a iron supplement and if so how much? Initial call taken by: Romualdo Bolk, CMA Duncan Dull),  June 11, 2009 2:06 PM  Follow-up for Phone Call        Pt had hep b series done 07/01/2001, 07/29/2001 and 01/17/2002 Follow-up by: Romualdo Bolk, CMA (AAMA),  June 13, 2009 5:14 PM  Additional Follow-up for Phone Call Additional follow up Details #1::        take iron  as per dr Juanda Chance .  also she had the hep b vaccine but her antibody study in 2007 was neg for aby response. It makes sense to give her a booster   for hep b  . Also if she wishes we can change to effexor  75 x r but   need to minotor BP readings as  this can slightly elevated BP in a few people.   Additional Follow-up by: Madelin Headings MD,  June 14, 2009 11:48 AM    Additional Follow-up for Phone Call Additional follow up Details #2::    Pt aware and rx sent to the pharmacy. Follow-up by: Romualdo Bolk, CMA (AAMA),  June 14, 2009 12:10 PM  New/Updated Medications: VENLAFAXINE HCL 75 MG XR24H-TAB (VENLAFAXINE HCL) 1 by mouth once daily Prescriptions: VENLAFAXINE HCL 75 MG XR24H-TAB (VENLAFAXINE HCL) 1 by mouth once daily  #90 x 0   Entered by:   Romualdo Bolk, CMA (AAMA)   Authorized by:   Madelin Headings MD   Signed by:   Romualdo Bolk, CMA (AAMA) on 06/14/2009   Method used:   Electronically to        [facility]* (retail)       [address]       [address] [place] [address]/mailing       [place], [place]  [address]       Ph: [phone]       Fax: [phone]   RxID:   [id number]

## 2010-06-11 NOTE — Assessment & Plan Note (Signed)
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 She is status post liver biopsy in August 2010 which did not show acute hepatitis but was consistent with  cirrhosis. Her initial ANA titer was 1:640 and it has come down to 1:80. Her IgG has been normal at 1370. Her liver function had improved from transaminases of over 100 down to almost normal on Imuran 150 mg daily. She has no complaints today. Patient has been working full time. Other medical problems include sleep apnea, DVT in 2006 and a cholecystectomy in 2006. Her hepatitis B and C serologies have been negative. An ultrasound of the abdomen showed splenomegaly with the spleen measuring 13.6 cm. Her INR is normal at 1.1.   GI Review of Systems      Denies abdominal pain, acid reflux, belching, bloating, chest pain, dysphagia with liquids, dysphagia with solids, heartburn, loss of appetite, nausea, vomiting, vomiting blood, weight loss, and  weight gain.        Denies anal fissure, black tarry stools, change in bowel habit, constipation, diarrhea, diverticulosis, fecal incontinence, heme positive stool, hemorrhoids, irritable bowel syndrome, jaundice, light color stool, liver problems, rectal bleeding, and  rectal pain.    Current Medications (verified): 1)  Bupropion Hcl 150 Mg  Tb12 (Bupropion Hcl) .[name].. 1 By Mouth Once Daily 2)  Omeprazole 20 Mg Cpdr (Omeprazole) .... Take 1 Capsule By Mouth Two Times A Day 3)  Onglyza 5 Mg Tabs (Saxagliptin Hcl) .... Once Daily 4)  Accu-Chek Multiclix Lancets   Misc (Lancets) .... Use As Directed 5)  Accu-Chek Aviva   Strp (Glucose Blood) .... Use As Directed 6)  Bayer Aspirin Ec Low Dose 81 Mg  Tbec (Aspirin) .... Take 1 Tablet By  Mouth Once A Day 7)  Cpap 13 Advanced 8)  Synthroid 125 Mcg Tabs (Levothyroxine Sodium) .[name].. 1 By Mouth Once Daily 9)  Vitamin D 16109 Unit Caps (Ergocalciferol) .[name].. 1 By Mouth Weekly 10)  Alprazolam 0.25 Mg Tabs (Alprazolam) .[name].. 1 By Mouth As Needed Anxiety 11)  Azathioprine 50 Mg Tabs (Azathioprine) .... Take 3 Tabs (150mg ) Once Daily. 12)  Lexapro 10 Mg Tabs (Escitalopram Oxalate) .[name].. 1 By Mouth Once Daily  As Directed 13)  Norvasc 5 Mg Tabs (Amlodipine Besylate) .[name].. 1 By Mouth Once Daily 14)  Hydrocodone-Homatropine 5-1.5 Mg/1ml Syrp (Hydrocodone-Homatropine) .[name].. 1-2 Tsp  Q 4-6 Hours As Needed Cough 15)  Venlafaxine Hcl 75 Mg Xr24h-Tab (Venlafaxine Hcl) .[name].. 1 By Mouth Once Daily  Allergies (verified): 1)  Tetracycline Hcl (Tetracycline Hcl) 2)  * Benicar  Past History:  Past Medical History: Reviewed history from 02/02/2009 and no changes required. 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 Gilford Rile   Past Surgical History: Reviewed history from 10/04/2008 and no changes required. Breast BX Tubal ligation Cholecystectomy Foot Surgery Eye surgery Cataract Extraction  Family History: Reviewed history from 06/08/2009 and no changes required. Family History Diabetes : mother (deceased) Family History Hypertension Family History Thyroid disease Family History of Arthritis  Family History of Stroke M 1st degree relative    Mom with ? RA  no  liver  autoimmune disease otherwise Family History of Heart Disease: Mother, Father Family History of Colon Cancer:PGF  Social History: Reviewed history from 02/23/2009 and no changes required. Married Never Smoked works [employer]    no etoh Daily Caffeine Use-2 cups daily Illicit Drug Use - no Patient does not get regular exercise.  Father died this year    Review of Systems  The patient denies allergy/sinus, anemia, anxiety-new, arthritis/joint pain, back pain, blood in urine, breast changes/lumps, change in vision, confusion, cough, coughing up blood, depression-new, fainting, fatigue, fever, headaches-new, hearing problems, heart murmur, heart rhythm changes, itching, menstrual pain, muscle pains/cramps, night sweats, nosebleeds, pregnancy symptoms, shortness of breath, skin rash, sleeping problems, sore throat, swelling of feet/legs, swollen lymph glands, thirst - excessive , urination - excessive , urination changes/pain, urine leakage, vision changes, and voice change.         Pertinent positive and negative review of systems were noted in the above HPI. All other ROS was otherwise negative.   Vital Signs:  Patient profile:   55 year old female Menstrual status:  postmenopausal Height:      65 inches Weight:      204.50 pounds BMI:     34.15 Pulse rate:   94 / minute Pulse rhythm:   regular BP sitting:   152 / 82  (left arm)  Vitals Entered By: Milford Cage NCMA (July 02, 2009 4:25 PM)  Physical Exam  General:  Well developed, well nourished, no acute distress. Eyes:  nonicteric. Neck:  Supple; no masses or thyromegaly. Lungs:  Clear throughout to auscultation. Heart:  Regular rate and rhythm; no murmurs, rubs,  or bruits. Abdomen:  soft nontender abdomen with normoactive bowel sounds. Liver edge costal margin. Splenic tip not palpable. No ascites. Extremities:  no edema. Neurologic:  no asterixis. Skin:  no palmar erythema. Psych:  Alert and cooperative. Normal mood and affect.   Impression & Recommendations:  Problem # 1:  IRON DEFICIENCY (ICD-280.9) Patient has been on iron supplements. She is due for a colonoscopy.  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 This is probably an autoimmune liver disease  although her IgG levels have been normal and liver biopsy does not show any piecemeal necrosis. I still feel that her liver disease is on the autoimmune basis and we will continue her on Imuran 50 mg daily. Her transaminases have certainly improved markedly while she has been on Imuran.  Problem # 3:  SPECIAL SCREENING FOR MALIGNANT NEOPLASMS COLON (ICD-V76.51) Patient has iron deficiency anemia. Patient has no risk factors for colon cancer but has never had a a screening colonoscopy. She says she had a hard time with liquids and wants to wait with a colonoscopy at this time. I advised her that she really needs a colonoscopy as soon as possible.  Patient Instructions: 1)  recall colonoscopy in June 2011. 2)  Continue Imuran 150 mg daily. 3)  Office visit 6 months. 4)  Liver function tests prior to her next visit. These will be done at a Dr. Rosezella [place] office. 5)  Upper abdominal ultrasound and alpha-fetoprotein yearly. 6)  Copy sent to : Dr Fabian Sharp 7)  The medication list was reviewed and reconciled.  All changed / newly prescribed medications were explained.  A complete medication list was provided to the patient / caregiver.

## 2010-06-11 NOTE — Progress Notes (Signed)
Summary: refill  Phone Note Call from Patient Call back at Home Phone [phone]   Caller: Patient Summary of Call: Pt needs a refill on Wellbutrin Initial call taken by: Romualdo Bolk, CMA Duncan Dull),  Sep 14, 2009 9:29 AM  Follow-up for Phone Call        Rx sent to pharmacy. Follow-up by: Romualdo Bolk, CMA (AAMA),  Sep 14, 2009 10:04 AM    New/Updated Medications: BUPROPION HCL 150 MG XR12H-TAB (BUPROPION HCL) 1 by mouth two times a day Prescriptions: BUPROPION HCL 150 MG XR12H-TAB (BUPROPION HCL) 1 by mouth two times a day  #180 x 0   Entered by:   Romualdo Bolk, CMA (AAMA)   Authorized by:   Madelin Headings MD   Signed by:   Romualdo Bolk, CMA (AAMA) on 09/14/2009   Method used:   Electronically to        [facility]* (retail)       [address]       [address] [employer] [employer]       [place], [place]  [address]       Ph: [phone]       Fax: [phone]   RxID:   [id number]

## 2010-06-11 NOTE — Assessment & Plan Note (Addendum)
Summary: F/U FROM INCREASING IMURAN            DEBORAH    History of Present Illness Visit Type: follow up  Primary GI MD: Lina Sar MD Primary Provider: [name], MD Requesting Provider: n/a Chief Complaint: F/u from increasing Imuran. Pt states that she feels great and denies any GI complaints  History of Present Illness:   This is a 55 year old white female with autoimmune  liver disease and cirrhosis found by a liver biopsy  in August 2010. She has been on Imuran 150 mg daily up from 100 mg once daily over the past 6 weeks and is tolerating it well. Her most recent liver function tests from 06/01/09 showed an AST of 39, ALT of 22, alkaline phosphatase of 193 and albumin of 4.2. She also has iron deficiency with a saturation of 7%. Patient works full time. She has no specific complaints other than depression. An upper abdominal ultrasound  in May 2010. next one in May 2011. The last measurment of her spleen was 12.9 cm. Her last ANA titer one year ago was 1: 640. In September 2010, her INR was 1.1, IgG 1370 and alpha-fetoprotein was 9.4.   GI Review of Systems      Denies abdominal pain, acid reflux, belching, bloating, chest pain, dysphagia with liquids, dysphagia with solids, heartburn, loss of appetite, nausea, vomiting, vomiting blood, weight loss, and  weight gain.        Denies anal fissure, black tarry stools, change in bowel habit, constipation, diverticulosis, fecal incontinence, heme positive stool, hemorrhoids, irritable bowel syndrome, jaundice, light color stool, liver problems, rectal bleeding, and  rectal pain.    Current Medications (verified): 1)  Bupropion Hcl 150 Mg  Tb12 (Bupropion Hcl) .[name].. 1 By Mouth Once Daily 2)  Omeprazole 20 Mg Cpdr (Omeprazole) .... Take 1 Capsule By Mouth Two Times A Day 3)  Onglyza 5 Mg Tabs (Saxagliptin Hcl) .... Once Daily 4)  Accu-Chek Multiclix Lancets   Misc (Lancets) .... Use As Directed 5)  Accu-Chek Aviva   Strp (Glucose Blood)  .... Use As Directed 6)  Bayer Aspirin Ec Low Dose 81 Mg  Tbec (Aspirin) .... Take 1 Tablet By Mouth Once A Day 7)  Cpap 13 Advanced 8)  Synthroid 125 Mcg Tabs (Levothyroxine Sodium) .[name].. 1 By Mouth Once Daily 9)  Vitamin D 25366 Unit Caps (Ergocalciferol) .[name].. 1 By Mouth Weekly 10)  Alprazolam 0.25 Mg Tabs (Alprazolam) .[name].. 1 By Mouth As Needed Anxiety 11)  Azathioprine 50 Mg Tabs (Azathioprine) .... Take 3 Tabs (150mg ) Once Daily. 12)  Lexapro 10 Mg Tabs (Escitalopram Oxalate) .[name].. 1 By Mouth Once Daily  As Directed 13)  Norvasc 5 Mg Tabs (Amlodipine Besylate) .[name].. 1 By Mouth Once Daily 14)  Hydrocodone-Homatropine 5-1.5 Mg/79ml Syrp (Hydrocodone-Homatropine) .[name].. 1-2 Tsp  Q 4-6 Hours As Needed Cough  Allergies (verified): 1)  Tetracycline Hcl (Tetracycline Hcl) 2)  * Benicar  Past History:  Past Medical History: Reviewed history from 02/02/2009 and no changes required. 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 Gilford Rile   Past Surgical History: Reviewed history from 10/04/2008 and no changes required. Breast BX Tubal ligation Cholecystectomy Foot Surgery Eye surgery Cataract Extraction  Family History: Family History Diabetes : mother (deceased) Family History Hypertension Family History Thyroid disease Family History of Arthritis  Family History  of Stroke M 1st degree relative    Mom with ? RA  no  liver autoimmune disease otherwise Family History of Heart Disease: Mother, Father Family History of Colon Cancer:PGF  Social History: Reviewed history from 02/23/2009 and no changes required. Married Never Smoked works [employer]    no etoh Daily Caffeine Use-2 cups daily Illicit Drug Use - no Patient does not get regular exercise.  Father died this year   Review of Systems  The patient denies  allergy/sinus, anemia, anxiety-new, arthritis/joint pain, back pain, blood in urine, breast changes/lumps, change in vision, confusion, cough, coughing up blood, depression-new, fainting, fatigue, fever, headaches-new, hearing problems, heart murmur, heart rhythm changes, itching, menstrual pain, muscle pains/cramps, night sweats, nosebleeds, pregnancy symptoms, shortness of breath, skin rash, sleeping problems, sore throat, swelling of feet/legs, swollen lymph glands, thirst - excessive , urination - excessive , urination changes/pain, urine leakage, vision changes, and voice change.         Pertinent positive and negative review of systems were noted in the above HPI. All other ROS was otherwise negative.   Vital Signs:  Patient profile:   55 year old female Menstrual status:  postmenopausal Height:      65 inches Weight:      208 pounds BMI:     34.74 BSA:     2.01 Pulse rate:   88 / minute Pulse rhythm:   regular BP sitting:   128 / 78  (left arm) Cuff size:   regular  Vitals Entered By: Ok Anis CMA (June 08, 2009 4:19 PM)  Physical Exam  General:  alert, oriented and overweight. Eyes:  PERRLA, no icterus. Mouth:  No deformity or lesions, dentition normal. Neck:  Supple; no masses or thyromegaly. Lungs:  Clear throughout to auscultation. Abdomen:  Soft, nontender and nondistended. Mild prominence of periumbilical veins. Left lobe of the liver slightly enlarged, negative spleen, no ascites. Extremities:  No clubbing, cyanosis, edema or deformities noted. Neurologic:  no asterixis. Skin:  no jaundice. Psych:  Alert and cooperative. Normal mood and affect.   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 She is tolerating Imuran 150 mg daily. We need to inquire about the hepatitis B vaccine in Dr Fabian Sharp office; she says she received the vaccination. We  will schedule an upper endoscopy to look for esophageal varices. We will also check metabolite levels for the Imuran.  Orders: EGD (EGD)  Problem # 2:  IRON DEFICIENCY (ICD-280.9) Patient has an iron saturation of 7%. She will start on one a day iron supplements over-the-counter. She is due for a screening colonoscopy this year but is not ready to have it done together with the EGD.  Orders: EGD (EGD)  Problem # 3:  ANXIETY, SITUATIONAL (ICD-308.3) Daughter had to move out, which causes her some anxiety.  Patient Instructions: 1)  Imuran 150 mg daily. 2)  Imuran metabolite levels. 3)  Colonoscopy in 2011. 4)  Schedule upper endoscopy to rule out esophageal varices. 5)  Over-the-counter iron supplements daily. 6)  ANA titer. 7)  Check with Dr. Fabian Sharp as to where patient received her hepatitis B vaccine. 8)  Discussed various over-the-counter medications contraindicated in liver disease such as excess Tylenol or NSAIDS. She is allowed to take up to 3 tablets of Tylenol a day for headaches. 9)  Copy sent to : Dr Fabian Sharp  Appended Document: F/U FROM INCREASING  [facility]            [facility] Patient never had endoscopy. She called and cancelled it.

## 2010-06-11 NOTE — Progress Notes (Signed)
Summary: Ask nurse a question   Phone Note Call from Patient Call back at [phone] UNTIL 2PM   Call For: Dr Juanda Chance Summary of Call: Wants to ask nurse if her appt can wait until next year? Initial call taken by: Leanor Kail [facility],  April 02, 2010 1:05 PM  Follow-up for Phone Call        Spoke with patient. She states she could not afford to come in earlier in the year. We did send her a letter to schedule an appointment 12/2009. She did have her labs with Dr. Fabian Sharp (results in EMR) She is out of her medications because we would not refill them without a visit. She wants to know if she should have a visit before the end of the year. Call back # after 3:30 PM on cell number.Please, advise. Follow-up by: Jesse Fall RN,  April 02, 2010 1:25 PM  Additional Follow-up for Phone Call Additional follow up Details #1::        OK to see me after New Year. Hold off on meds. We will discuss it. Additional Follow-up by: Hart Carwin MD,  April 03, 2010 8:56 AM    Additional Follow-up for Phone Call Additional follow up Details #2::    Pt notified of Dr. Regino Schultze recommendations. Scheduled patient to see Dr. Juanda Chance 05/27/10 at 3:45 PM/ Follow-up by: Jesse Fall RN,  April 03, 2010 9:06 AM

## 2010-06-11 NOTE — Progress Notes (Signed)
Summary: refill  Phone Note Refill Request Message from:  Fax from Pharmacy  Refills Requested: Medication #1:  NORVASC 5 MG TABS 1 by mouth once daily   Brand Name Necessary? No [facility] fax---[phone]  Initial call taken by: Warnell Forester,  June 13, 2009 10:55 AM  Follow-up for Phone Call        This was sent electronically on 1/31 ok x 4. Follow-up by: Romualdo Bolk, CMA (AAMA),  June 13, 2009 12:03 PM  Additional Follow-up for Phone Call Additional follow up Details #1::        ok .[name]...noted. Additional Follow-up by: Warnell Forester,  June 13, 2009 12:45 PM

## 2010-06-11 NOTE — Progress Notes (Signed)
Summary: refill   Phone Note Call from Patient Call back at Home Phone [phone]   Caller: Patient Summary of Call: omeprazole needs refilled Initial call taken by: Romualdo Bolk, CMA Duncan Dull),  February 04, 2010 12:56 PM  Follow-up for Phone Call        Rx sent to pharmacy and pt has a follow up appt in 02/2010 Follow-up by: Romualdo Bolk, CMA (AAMA),  February 04, 2010 12:57 PM    Prescriptions: OMEPRAZOLE 20 MG CPDR (OMEPRAZOLE) Take 1 capsule by mouth two times a day  #180 Capsule x 0   Entered by:   Romualdo Bolk, CMA (AAMA)   Authorized by:   Madelin Headings MD   Signed by:   Romualdo Bolk, CMA (AAMA) on 02/04/2010   Method used:   Electronically to        [facility]* (retail)       [address]       [address] [place] [employer]mail[address]       [place], [place]  [address]       Ph: [phone]       Fax: [phone]   RxID:   [id number]

## 2010-06-11 NOTE — Progress Notes (Signed)
Summary: Does pt need to take Vit D  Phone Note Call from Patient Call back at Home Phone [phone]   Caller: Patient Summary of Call: Pt wants to know if she still needs to take Vit d 50,000 International Units a week or OTC? Initial call taken by: Romualdo Bolk, CMA Duncan Dull),  April 02, 2010 1:29 PM  Follow-up for Phone Call        interesting that her levels are  still slightly low on  high dose vit d.   I tmay not make a difference  .  I would change to   OTC 1000-2000 international units vit D per day  and we can recheck her level at her next lab draw in 3 months or so. Follow-up by: Madelin Headings MD,  April 03, 2010 8:50 AM  Additional Follow-up for Phone Call Additional follow up Details #1::        Pt aware and will call back to make a 3 month lab appt. Additional Follow-up by: Romualdo Bolk, CMA (AAMA),  April 03, 2010 9:00 AM

## 2010-06-11 NOTE — Letter (Signed)
Summary: EGD Instructions  [facility]  [address] [place], [place] [address]   Phone: [phone]  Fax: [phone]       [name]    10-Apr-1956    MRN: [id number]       Procedure Day /Date: 07/17/09 (Tuesday)     Arrival Time: 9:00 am     Procedure Time: 10:00 am     Location of Procedure:                    _ x _ [facility] (4th Floor)  PREPARATION FOR ENDOSCOPY   On 07/17/09 THE DAY OF THE PROCEDURE:  1.   No solid foods, milk or milk products are allowed after midnight the night before your procedure.  2.   Do not drink anything colored red or purple.  Avoid juices with pulp.  No orange juice.  3.  You may drink clear liquids until 8:00 am which is 2 hours before your procedure.                                                                                                CLEAR LIQUIDS INCLUDE: Water Jello Ice Popsicles Tea (sugar ok, no milk/cream) Powdered fruit flavored drinks Coffee (sugar ok, no milk/cream) Gatorade Juice: apple, white grape, white cranberry  Lemonade Clear bullion, consomm, broth Carbonated beverages (any kind) Strained chicken noodle soup Hard Candy   MEDICATION INSTRUCTIONS  Unless otherwise instructed, you should take regular prescription medications with a small sip of water as early as possible the morning of your procedure.  Diabetic patients - see separate instructions.                OTHER INSTRUCTIONS  You will need a responsible adult at least 57 years of age to accompany you and drive you home.   This person must remain in the waiting room during your procedure.  Wear loose fitting clothing that is easily removed.  Leave jewelry and other valuables at home.  However, you may wish to bring a book to read or an iPod/MP3 player to listen to music as you wait for your procedure to start.  Remove all body piercing jewelry and leave at home.  Total time from sign-in until discharge  is approximately 2-3 hours.  You should go home directly after your procedure and rest.  You can resume normal activities the day after your procedure.  The day of your procedure you should not:   Drive   Make legal decisions   Operate machinery   Drink alcohol   Return to work  You will receive specific instructions about eating, activities and medications before you leave.    The above instructions have been reviewed and explained to me by   Hortense Ramal, CMA    I fully understand and can verbalize these instructions _____________________________ Date 06/08/09

## 2010-06-11 NOTE — Progress Notes (Signed)
Summary: refill  Phone Note Call from Patient Call back at Home Phone [phone]   Caller: Patient Summary of Call: Pt needs a rx for norvasc and lexapro. Pt is going to make a follow up in Oct but can't afford to make one until then. Rx sent to pharmacy. appt made. Initial call taken by: Romualdo Bolk, CMA (AAMA),  January 10, 2010 11:24 AM    Prescriptions: LEXAPRO 10 MG TABS (ESCITALOPRAM OXALATE) 1 by mouth once daily  #30 x 2   Entered by:   Romualdo Bolk, CMA (AAMA)   Authorized by:   Madelin Headings MD   Signed by:   Romualdo Bolk, CMA (AAMA) on 01/10/2010   Method used:   Electronically to        [facility]* (retail)       [address]       [address] [place] [employer]       [place], [place]  [address]       Ph: [phone]       Fax: [phone]   RxID:   [id number] NORVASC 5 MG TABS (AMLODIPINE BESYLATE) 1 by mouth once daily  #30 Tablet x 2   Entered by:   Romualdo Bolk, CMA (AAMA)   Authorized by:   Madelin Headings MD   Signed by:   Romualdo Bolk, CMA (AAMA) on 01/10/2010   Method used:   Electronically to        [facility]* (retail)       [address]       [address] [place] [employer]       [place], [place]  [address]       Ph: [phone]       Fax: [phone]   RxID:   [id number]

## 2010-06-11 NOTE — Progress Notes (Signed)
Summary: refill on alprazolam  Phone Note From Pharmacy   Caller: [facility] Reason for Call: Needs renewal Details for Reason: alprazolam 0.25mg g Summary of Call: last filled on 10/17/08 #24 Initial call taken by: Romualdo Bolk, CMA (AAMA),  June 04, 2009 11:42 AM  Follow-up for Phone Call        refill  # 40    Follow-up by: Madelin Headings MD,  June 04, 2009 1:21 PM  Additional Follow-up for Phone Call Additional follow up Details #1::        Rx called into [place] at [facility]. Additional Follow-up by: Romualdo Bolk, CMA (AAMA),  June 04, 2009 2:22 PM    New/Updated Medications: ALPRAZOLAM 0.25 MG TABS (ALPRAZOLAM) 1 by mouth as needed anxiety Prescriptions: ALPRAZOLAM 0.25 MG TABS (ALPRAZOLAM) 1 by mouth as needed anxiety  #40 x 0   Entered by:   Romualdo Bolk, CMA (AAMA)   Authorized by:   Madelin Headings MD   Signed by:   Romualdo Bolk, CMA (AAMA) on 06/04/2009   Method used:   Telephoned to ...       [facility]* (retail)       [address]       [address] Shipping/mailing       [place], [place]  [address]       Ph: [phone]       Fax: [phone]   RxID:   [id number]

## 2010-06-11 NOTE — Progress Notes (Signed)
Summary: leg swelling.  Phone Note Call from Patient Call back at [facility] Phone [phone]   Summary of Call: PT is having leg redness and swelling. Swelling is 1 1/2 to 2 times normal size. BP is 165/71. BS 235 and is feeling dizzy. Pt would like to be worked in at [employer] today if possible Initial call taken by: Romualdo Bolk, CMA Duncan Dull),  August 01, 2009 2:49 PM  Follow-up for Phone Call        Offered appt today at 4pm. Pt to wait and see how she does and wants an appt on friday.  Per Dr. Fabian Sharp Friday 4pm okay. Appt made. Follow-up by: Romualdo Bolk, CMA Duncan Dull),  August 01, 2009 3:29 PM

## 2010-06-11 NOTE — Progress Notes (Signed)
Summary: rx for True Result gluometer  Phone Note From Other Clinic   Caller: [facility] Summary of Call: Pt needs a rx for a True Results Glucometer and test strips sent to [facility]. Initial call taken by: Romualdo Bolk, CMA Duncan Dull),  March 28, 2010 1:30 PM  Follow-up for Phone Call        Rx sent to pharmacy and pt aware. Follow-up by: Romualdo Bolk, CMA Duncan Dull),  March 28, 2010 1:33 PM  Additional Follow-up for Phone Call Additional follow up Details #1::        ok to do this     New/Updated Medications: TRUETEST TEST  STRP (GLUCOSE BLOOD) Check Blood Sugar two times a day or as directed TRUERESULT BLOOD GLUCOSE W/DEVICE KIT (BLOOD GLUCOSE MONITORING SUPPL) Check Blood Sugar two times a day Prescriptions: TRUERESULT BLOOD GLUCOSE W/DEVICE KIT (BLOOD GLUCOSE MONITORING SUPPL) Check Blood Sugar two times a day  #1 x 0   Entered by:   Romualdo Bolk, CMA (AAMA)   Authorized by:   Madelin Headings MD   Signed by:   Romualdo Bolk, CMA (AAMA) on 03/28/2010   Method used:   Electronically to        [facility]* (retail)       [address]       [address] [place] Shipping[address]mailing       [place], [place]  [address]       Ph: [phone]       Fax: [phone]   RxID:   [id number] TRUETEST TEST  STRP (GLUCOSE BLOOD) Check Blood Sugar two times a day or as directed  #180 x 11   Entered by:   Romualdo Bolk, CMA (AAMA)   Authorized by:   Madelin Headings MD   Signed by:   Romualdo Bolk, CMA (AAMA) on 03/28/2010   Method used:   Electronically to        [facility]* (retail)       [address]       [address] [place] [address]mailing       [place], [place]  [address]       Ph: [phone]       Fax: [phone]   RxID:   [id number]

## 2010-06-11 NOTE — Assessment & Plan Note (Signed)
Summary: elevated bs/ssc   Vital Signs:  Patient profile:   55 year old female Menstrual status:  postmenopausal Weight:      209 pounds Pulse rate:   100 / minute BP sitting:   140 / 80  (left arm) Cuff size:   regular  Vitals Entered By: Romualdo Bolk, CMA (AAMA) (September 06, 2009 8:35 AM) CC: Blood Sugar elevated this am at 6:30am 1 hour after eating it was 393. Pt also d/c effexor on 4/25 due to dry mouth and crying all the time., Hypertension Management CBG Result 396   History of Present Illness: [name] comes in comes in today  for acute visit . She is quite distressed about her elvated BG and mood issue and  poss  se of meds . She thinks her mood is worse and crying all the time  stopped meds because  of dry mouth.    BG:   had some urinary signs and took cranberry juice cause doc where she worked said it would help her for uti   .Sugars very high 200-300  just this week    She is only on onglyza  even though rx given last time for a sulfonyurea.    She is not taking it   didnt think she was supposed to.  Came home from work because she felt so bad.   NO dysruria but frequency recently that she says when her bgs were allunder 200. OSA:   Under rx and no change .  Mood   went off effexor for serious dry mouth that she couldnt stand. Liver  : doing well may be able to go off   immunosuppressants.  Hypertension History:      She complains of dyspnea with exertion, but denies headache, chest pain, palpitations, orthopnea, PND, peripheral edema, visual symptoms, neurologic problems, syncope, and side effects from treatment.  She notes no problems with any antihypertensive medication side effects.  Pt states that she feels weak.        Positive major cardiovascular risk factors include female age 7 years old or older, diabetes, hyperlipidemia, and hypertension.  Negative major cardiovascular risk factors include non-tobacco-user status.      Preventive  Screening-Counseling & Management  Alcohol-Tobacco     Alcohol drinks/day: 0     Smoking Status: never  Caffeine-Diet-Exercise     Caffeine use/day: 3     Does Patient Exercise: no  Current Medications (verified): 1)  Omeprazole 20 Mg Cpdr (Omeprazole) .... Take 1 Capsule By Mouth Two Times A Day 2)  Onglyza 5 Mg Tabs (Saxagliptin Hcl) .... Once Daily 3)  Accu-Chek Multiclix Lancets   Misc (Lancets) .... Use As Directed 4)  Accu-Chek Aviva   Strp (Glucose Blood) .... Use As Directed 5)  Bayer Aspirin Ec Low Dose 81 Mg  Tbec (Aspirin) .... Take 1 Tablet By Mouth Once A Day 6)  Cpap 13 Advanced 7)  Synthroid 125 Mcg Tabs (Levothyroxine Sodium) .[name].. 1 By Mouth Once Daily 8)  Vitamin D 04540 Unit Caps (Ergocalciferol) .[name].. 1 By Mouth Weekly 9)  Alprazolam 0.25 Mg Tabs (Alprazolam) .[name].. 1 By Mouth As Needed Anxiety 10)  Azathioprine 50 Mg Tabs (Azathioprine) .... Take 3 Tabs (150mg ) Once Daily. 11)  Norvasc 5 Mg Tabs (Amlodipine Besylate) .[name].. 1 By Mouth Once Daily 12)  Hydrocodone-Homatropine 5-1.5 Mg/2ml Syrp (Hydrocodone-Homatropine) .[name].. 1-2 Tsp  Q 4-6 Hours As Needed Cough 13)  Furosemide 20 Mg Tabs (Furosemide) .[name].. 1 By Mouth Once Daily  As Directed  Allergies (verified): 1)  Tetracycline Hcl (Tetracycline Hcl) 2)  * Benicar 3)  * Effexor  Past History:  Past medical, surgical, family and social histories (including risk factors) reviewed, and no changes noted (except as noted below).  Past Medical History: Reviewed history from 02/02/2009 and no changes required. 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 Gilford Rile   Past Surgical History: Reviewed history from 10/04/2008 and no changes required. Breast BX Tubal ligation Cholecystectomy Foot Surgery Eye surgery Cataract Extraction  Past History:  Care  Management: Pulmonary: Dr. Maple Hudson Gastroenterology: Juanda Chance Psychiatry: Judithe Modest Endocrinology: Everardo All  Family History: Reviewed history from 06/08/2009 and no changes required. Family History Diabetes : mother (deceased) Family History Hypertension Family History Thyroid disease Family History of Arthritis  Family History of Stroke M 1st degree relative    Mom with ? RA  no  liver autoimmune disease otherwise Family History of Heart Disease: Mother, Father Family History of Colon Cancer:PGF  Social History: Reviewed history from 02/23/2009 and no changes required. Married Never Smoked works [employer]    no etoh Daily Caffeine Use-2 cups daily Illicit Drug Use - no Patient does not get regular exercise.  Father died this year   Review of Systems       The patient complains of depression.  The patient denies anorexia, fever, weight gain, vision loss, decreased hearing, chest pain, dyspnea on exertion, prolonged cough, headaches, abdominal pain, melena, hematochezia, severe indigestion/heartburn, hematuria, incontinence, difficulty walking, abnormal bleeding, enlarged lymph nodes, and angioedema.    Physical Exam  General:  Well-developed,well-nourished,in no acute distress; alert,appropriate and cooperative throughout examination Head:  normocephalic and atraumatic.   Eyes:  vision grossly intact, pupils equal, and pupils round.   Neck:  No deformities, masses, or tenderness noted. Lungs:  Normal respiratory effort, chest expands symmetrically. Lungs are clear to auscultation, no crackles or wheezes. Heart:  Normal rate and regular rhythm. S1 and S2 normal without gallop, murmur, click, rub or other extra sounds. Abdomen:  soft, non-tender, normal bowel sounds, no distention, no masses, no guarding, no rebound tenderness, no hepatomegaly, and no splenomegaly.   protuberant Pulses:  pulses intact without delay   Extremities:  trace left pedal edema and trace  right pedal edema.   no redness Neurologic:  alert & oriented X3, strength normal in all extremities, and gait normal.   Skin:  turgor normal and color normal.  tanned appearing Cervical Nodes:  No lymphadenopathy noted Psych:  Oriented X3, good eye contact, and moderately anxious.  slightly depressed    Impression & Recommendations:  Problem # 1:  DIABETES MELLITUS, TYPE II (ICD-250.00) out of control  perhaps confusion about meds and plans if bg elevated      will restart the glimeprimide . No obv UTI but will do culture .  The following medications were removed from the medication list:    Glimepiride 2 Mg Tabs (Glimepiride) .[name]... Take 1/2 to 1 by mouth once daily for diabetes Her updated medication list for this problem includes:    Onglyza 5 Mg Tabs (Saxagliptin hcl) ..... Once daily    Bayer Aspirin Ec Low Dose 81 Mg Tbec (Aspirin) .[name]... Take 1 tablet by mouth once a day    Glimepiride 2 Mg Tabs (Glimepiride) .[name]... 1 by mouth once daily or as directed fdor  diabetes  Orders: Capillary Blood Glucose/CBG (25956) TLB-BMP (Basic Metabolic Panel-BMET) (80048-METABOL) TLB-A1C / Hgb A1C (Glycohemoglobin) (83036-A1C) Admin of patients own med IM/SQ (38756E)  Problem # 2:  ADVERSE REACTION TO MEDICATION (ICD-995.29) dry mouth could be from sugars   out of control also .   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 1 by mouth once daily as directed  Problem # 4:  MORBID OBESITY (ICD-278.01)  Orders: UA Dipstick w/o Micro (automated)  (81003)  Problem # 5:  DEPRESSION (ICD-311) with anxiety   job stress a problem also   disc and counseled  ok to try the xanax as needed  for now.  change back to her previous meds and lexapro samples given   will follow  The  following medications were removed from the medication list:    Venlafaxine Hcl 75 Mg Xr24h-tab (Venlafaxine hcl) .[name]... 1 by mouth once daily Her updated medication list for this problem includes:    Alprazolam 0.25 Mg Tabs (Alprazolam) .[name]... 1 by mouth as needed anxiety    Lexapro 10 Mg Tabs (Escitalopram oxalate) .[name]... 1 by mouth once daily    Bupropion Hcl 150 Mg Xr12h-tab (Bupropion hcl) .[name]... 1 by mouth two times a day ? dosing  Complete Medication List: 1)  Omeprazole 20 Mg Cpdr (Omeprazole) .... Take 1 capsule by mouth two times a day 2)  Onglyza 5 Mg Tabs (Saxagliptin hcl) .... Once daily 3)  Accu-chek Multiclix Lancets Misc (Lancets) .... Use as directed 4)  Accu-chek Aviva Strp (Glucose blood) .... Use as directed 5)  Bayer Aspirin Ec Low Dose 81 Mg Tbec (Aspirin) .... Take 1 tablet by mouth once a day 6)  Cpap 13 Advanced  7)  Synthroid 125 Mcg Tabs (Levothyroxine sodium) .[name].. 1 by mouth once daily 8)  Vitamin D 33295 Unit Caps (Ergocalciferol) .[name].. 1 by mouth weekly 9)  Alprazolam 0.25 Mg Tabs (Alprazolam) .[name].. 1 by mouth as needed anxiety 10)  Azathioprine 50 Mg Tabs (Azathioprine) .... Take 3 tabs (150mg ) once daily. 11)  Norvasc 5 Mg Tabs (Amlodipine besylate) .[name].. 1 by mouth once daily 12)  Hydrocodone-homatropine 5-1.5 Mg/73ml Syrp (Hydrocodone-homatropine) .[name].. 1-2 tsp  q 4-6 hours as needed cough 13)  Furosemide 20 Mg Tabs (Furosemide) .[name].. 1 by mouth once daily as directed 14)  Glimepiride 2 Mg Tabs (Glimepiride) .[name].. 1 by mouth once daily or as directed fdor diabetes 15)  Lexapro 10 Mg Tabs (Escitalopram oxalate) .[name].. 1 by mouth once daily 16)  Bupropion Hcl 150 Mg Xr12h-tab (Bupropion hcl) .[name].. 1 by mouth two times a day ? dosing  Other Orders: T-Culture, Urine ([id number])  Hypertension Assessment/Plan:      The patient's hypertensive risk group is category C: Target organ damage and/or diabetes.  Her calculated 10 year risk of coronary heart disease is > 32 %.   Today's blood pressure is 140/80.  Her blood pressure goal is < 140/90.  Contraindications/Deferment of Procedures/Staging:    Treatment: Statin    Contraindication: liver disease     Treatment: ARB    Contraindication: cough   Patient Instructions: 1)  begin  the  glimeprimide 2 mg per day in the morning  before your morning eating. 2)  no sugar drinks .  3)  Will restart  lexapro  10 mg  and  wellbutrin   in the meantime . 4)  Get follow up appt with  Dr   Everardo All.   5)  rov regarding the lexapro type meds   in 3-4 weeks. Prescriptions: GLIMEPIRIDE 2 MG TABS (GLIMEPIRIDE) 1 by mouth once daily or as directed fdor diabetes  #90 x 1   Entered and Authorized by:   Madelin Headings MD   Signed by:   Madelin Headings MD on 09/06/2009   Method used:   Electronically to        [facility]* (retail)       [address]       [address] [place] Shipping/mailing       [place], [place]  [address]       Ph: [phone]       Fax: [phone]   RxID:   [id number]   Laboratory Results   Urine Tests  Date/Time Received: September 06, 2009 8:50 AM   Routine Urinalysis   Color: yellow Appearance: Clear Glucose: 500   (Normal Range: Negative) Bilirubin: negative   (Normal Range: Negative) Ketone: negative   (Normal Range: Negative) Spec. Gravity: 1.010   (Normal Range: 1.003-1.035) Blood: negative   (Normal Range: Negative) pH: 5.5   (Normal Range: 5.0-8.0) Protein: negative   (Normal Range: Negative) Urobilinogen: 0.2   (Normal Range: 0-1) Nitrite: negative   (Normal Range: Negative) Leukocyte Esterace: trace   (Normal Range: Negative)    Comments: Romualdo Bolk, CMA (AAMA)  September 06, 2009 8:51 AM   Blood Tests     CBG Random:: 396mg /dL      Medication Administration  Injection # 1:    Medication: Insulin 5 units    Diagnosis: DIABETES MELLITUS, TYPE II (ICD-250.00)    Route: SQ    Site: L deltoid    Exp Date: 01/11/2012    Lot #: [id number]     Mfr: Lilly    Comments: Gave pt 6u oh Humalog    Patient tolerated injection without complications    Given by: Romualdo Bolk, CMA (AAMA) (September 06, 2009 10:09 AM)  Orders Added: 1)  Capillary Blood Glucose/CBG [82948] 2)  UA Dipstick w/o Micro (automated)  [81003] 3)  T-Culture, Urine [[id number]] 4)  TLB-BMP (Basic Metabolic Panel-BMET) [80048-METABOL] 5)  TLB-A1C / Hgb A1C (Glycohemoglobin) [83036-A1C] 6)  Admin of patients own med IM/SQ [96372M] 7)  Est. Patient Level IV [55732] Disc  blood sugars and  call if not coming down.

## 2010-06-12 ENCOUNTER — Ambulatory Visit: Payer: Commercial Managed Care - PPO | Attending: Internal Medicine | Admitting: *Deleted

## 2010-06-12 DIAGNOSIS — E785 Hyperlipidemia, unspecified: Secondary | ICD-10-CM | POA: Insufficient documentation

## 2010-06-12 DIAGNOSIS — Z713 Dietary counseling and surveillance: Secondary | ICD-10-CM | POA: Insufficient documentation

## 2010-06-12 DIAGNOSIS — I10 Essential (primary) hypertension: Secondary | ICD-10-CM | POA: Insufficient documentation

## 2010-06-13 NOTE — Progress Notes (Signed)
Summary: ?s   Phone Note Call from Patient Call back at Work Phone [phone] Call back at [phone]   Caller: Patient Call For: Dr. Juanda Chance Reason for Call: Talk to Nurse Summary of Call: pt had a message from Dr. Delia Chimes nurse yesterday and she does not remember the name of who called, she is upset because she does not know why we are calling and wants a call back Initial call taken by: [place] Johnson,  June 07, 2010 12:50 PM  Follow-up for Phone Call        Called and gave patient her lab results as per Dr. Juanda Chance. Follow-up by: Jesse Fall RN,  June 07, 2010 2:12 PM

## 2010-06-13 NOTE — Miscellaneous (Signed)
Summary: [facility] Home Visit & Medication Summary  [facility] Home Visit & Medication Summary   Imported By: Maryln Gottron 04/25/2010 09:40:12  _____________________________________________________________________  External Attachment:    Type:   Image     Comment:   External Document

## 2010-06-13 NOTE — Assessment & Plan Note (Addendum)
Summary: cirrrhosis, autoimmune hepatitis/[name]    History of Present Illness Visit Type: Follow-up Visit Primary GI MD: Lina Sar MD Primary Provider: [name], MD Requesting Provider: n/a Chief Complaint: Patient here to follow up on her cirrhosis. She denies any GI problems. She wants to know if she needs to continue her AZATHIOPRINE, she has been off of it since last year. 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 Her last office visit was in February 2011. A liver biopsy in August 2011 showed cirrhosis and regenerative nodules, bile duct proliferation and moderate steatosis. Her iron stores were normal. She had splenomegaly on an upper abdominal ultrasound and a normal INR of 1.1. She has a history of a laparoscopic cholecystectomy in 2006, deep venous thrombosis in 2006 and obstructive sleep apnea. She has no specific complaints. 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 She has mild fluid retention. She denies any jaundice or pruritus.   GI Review of Systems      Denies abdominal pain, acid reflux, belching, bloating, chest pain, dysphagia with liquids, dysphagia with solids, heartburn, loss of appetite, nausea, vomiting, vomiting blood, weight loss, and  weight gain.        Denies anal fissure, black tarry stools, change in bowel habit, constipation, diarrhea, diverticulosis, fecal incontinence, heme positive stool, hemorrhoids, irritable bowel syndrome, jaundice, light color stool, liver problems, rectal bleeding, and  rectal pain.    Current Medications (verified): 1)  Omeprazole 20 Mg Cpdr (Omeprazole) .... Take 1 Capsule By Mouth Two Times A Day 2)  Onglyza 5 Mg Tabs (Saxagliptin Hcl) .... Once Daily 3)  Accu-Chek Multiclix Lancets   Misc (Lancets) .... Use As Directed 4)  Truetest Test  Strp (Glucose Blood) .... Check Blood Sugar Two Times A Day  or As Directed 5)  Bayer Aspirin Ec Low Dose 81 Mg  Tbec (Aspirin) .... Take 1 Tablet By Mouth Once A Day 6)  Cpap 13 Advanced 7)  Synthroid 125 Mcg Tabs (Levothyroxine Sodium) .[name].. 1 By Mouth Once Daily 8)  Alprazolam 0.25 Mg Tabs (Alprazolam) .[name].. 1 By Mouth As Needed Anxiety 9)  Azathioprine 50 Mg Tabs (Azathioprine) .... Take 3 Tabs (150mg ) Once Daily. 10)  Norvasc 5 Mg Tabs (Amlodipine Besylate) .[name].. 1 1/2  By Mouth Once Daily 11)  Furosemide 20 Mg Tabs (Furosemide) .[name].. 1 By Mouth Once Daily As Directed 12)  Glimepiride 2 Mg Tabs (Glimepiride) .[name].. 1 By Mouth Once Daily or As Directed Fdor Diabetes 13)  Lexapro 10 Mg Tabs (Escitalopram Oxalate) .[name].. 1 By Mouth Once Daily 14)  Bupropion Hcl 150 Mg Xr12h-Tab (Bupropion Hcl) .[name].. 1 By Mouth Two Times A Day 15)  Trueresult Blood Glucose W/device Kit (Blood Glucose Monitoring Suppl) .... Check Blood Sugar Two Times A Day  Allergies (verified): 1)  Tetracycline Hcl (Tetracycline Hcl) 2)  * Benicar 3)  * Effexor  Past History:  Past Medical History: Reviewed history from 02/02/2009 and no changes required. 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 Gilford Rile   Past Surgical History: Reviewed history from 10/04/2008 and no changes required. Breast BX Tubal ligation Cholecystectomy Foot Surgery Eye surgery Cataract Extraction  Family History: Family History Diabetes : mother (deceased), paternal grandmother Family History  Hypertension: mother Family History of Arthritis: mother Family History of Stroke M 1st degree relative: father    Mom with ? RA  no  liver autoimmune disease otherwise Family History of Heart Disease: Mother, Father Family History of Colon Cancer:PGF  Social History: Reviewed history from 02/23/2009 and no changes required. Married Never Smoked works [employer]    no etoh Daily Caffeine Use-2 cups daily plus couple of sodas a day Illicit Drug Use - no Patient does not get regular exercise.  Father died this year   Review of Systems       The patient complains of allergy/sinus, anxiety-new, arthritis/joint pain, depression-new, fatigue, shortness of breath, and swelling of feet/legs.  The patient denies anemia, back pain, blood in urine, breast changes/lumps, change in vision, confusion, cough, coughing up blood, fainting, fever, headaches-new, hearing problems, heart murmur, heart rhythm changes, itching, menstrual pain, muscle pains/cramps, night sweats, nosebleeds, pregnancy symptoms, skin rash, sleeping problems, sore throat, swollen lymph glands, thirst - excessive , urination - excessive , urination changes/pain, urine leakage, vision changes, and voice change.         Pertinent positive and negative review of systems were noted in the above HPI. All other ROS was otherwise negative.   Vital Signs:  Patient profile:   55 year old female Menstrual status:  postmenopausal Height:      65 inches Weight:      210.8 pounds BMI:     35.21 Pulse rate:   88 / minute Pulse rhythm:   regular BP sitting:   150 / 70  (left arm) Cuff size:   regular  Vitals Entered By: Harlow Mares CMA Duncan Dull) (May 27, 2010 4:06 PM)  Physical Exam  General:  Well developed, well nourished, no acute distress. Eyes:  PERRLA, no icterus. Mouth:  No deformity or lesions, dentition normal. Neck:  Supple; no masses or thyromegaly. Lungs:  Clear throughout to auscultation. Heart:  Regular rate and rhythm; no murmurs, rubs,  or bruits. Abdomen:  soft, protuberant abdomen with normoactive bowel sounds. Firm liver edge below right costal margin and enlarged left lobe of the liver. No ascites. Splenic tip not palpable. Lower abdomen unremarkable. Extremities:  1+ pedal edema bilaterally. Neurologic:  no asterixis. Skin:  no palmar  erythema. Psych:  Alert and cooperative. Normal mood and affect.   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 It was initially thought to be autoimmune but her IgG levels have been normal and she has not responded to Imuran or steroids. Her alkaline phosphatase and transaminases continue to be mildly elevated. Her ANA titer in the meantime has come down to 1:80 .[name] We will stop her on Imuran at this time and continue to follow her with no medications. Her liver function tests will be checked in 6 months. Orders: T-Alpha-1-Antitrypsin Tot [phone]) T-AMA ([id number]) T-ANA [phone]) T-Anti SMA ([id number]) T-Ceruloplasmin ([id number]) TLB-Hepatic/Liver Function Pnl (80076-HEPATIC)  Problem # 2:  MORBID OBESITY (ICD-278.01) stable weight. Patient needs to try to decrease weight.  Patient Instructions: 1)  Copy sent to : [name], MD 2)  You should have the following labs today: Alpha 1 antitrypsin, AMA, ANA, Anti SMA, Ceruloplasmin. Go to the basement for these. 3)  Hold Imuran for the next 6 months until the repeat liver function tests. 4)  The medication list was reviewed and reconciled.  All changed / newly prescribed medications  were explained.  A complete medication list was provided to the patient / caregiver.

## 2010-07-10 ENCOUNTER — Other Ambulatory Visit: Payer: Self-pay | Admitting: *Deleted

## 2010-07-10 MED ORDER — ESCITALOPRAM OXALATE 10 MG PO TABS: 10.0000 mg | ORAL_TABLET | Freq: Every day | ORAL | Status: DC

## 2010-07-16 ENCOUNTER — Telehealth: Payer: Self-pay | Admitting: *Deleted

## 2010-07-16 NOTE — Telephone Encounter (Signed)
Pt's HgA1C was 6.4 BP was elevated in both arms; 150/80 in rt and 144/70 in left. Pt is monitoring her bp at home and reports that her SBP is in the 140-150's. Pt is currently taking amlodipine 7.5mg  daily but pt has edema in both legs. With this in mind, would recommend adding a second agent to control her bp, such as HCTZ 25mg . Lipid panel from an employer sponsored biometric screening in Nov was total 217, ldl 159, hdl 43 and trig 76. Pt reports that she has had increase in LFT's while taking a statin. Would recommend starting an alternative agent such as Zetia 10 daily to lower LDL.

## 2010-07-23 NOTE — Telephone Encounter (Signed)
Noted  She has appt in May and we can see her earlier    Call if bp getting higher   In the meantime.

## 2010-07-24 NOTE — Telephone Encounter (Signed)
Pt aware of this. 

## 2010-08-18 LAB — CBC
MCHC: 33.9 g/dL (ref 30.0–36.0)
Platelets: 183 10*3/uL (ref 150–400)
RBC: 4.39 MIL/uL (ref 3.87–5.11)
RDW: 15.2 % (ref 11.5–15.5)

## 2010-08-18 LAB — APTT: aPTT: 27 seconds (ref 24–37)

## 2010-08-18 LAB — PROTIME-INR
INR: 1 (ref 0.00–1.49)
Prothrombin Time: 13.8 seconds (ref 11.6–15.2)

## 2010-09-23 ENCOUNTER — Other Ambulatory Visit (INDEPENDENT_AMBULATORY_CARE_PROVIDER_SITE_OTHER): Payer: Commercial Managed Care - PPO | Admitting: Internal Medicine

## 2010-09-23 DIAGNOSIS — Z0000 Encounter for general adult medical examination without abnormal findings: Secondary | ICD-10-CM

## 2010-09-23 DIAGNOSIS — Z13220 Encounter for screening for lipoid disorders: Secondary | ICD-10-CM

## 2010-09-23 LAB — LIPID PANEL
HDL: 49.7 mg/dL (ref >39.00)
Triglycerides: 65 mg/dL (ref 0.0–149.0)
VLDL: 13 mg/dL (ref 0.0–40.0)

## 2010-09-23 LAB — LDL CHOLESTEROL, DIRECT: Direct LDL: 160.2 mg/dL

## 2010-09-23 LAB — BASIC METABOLIC PANEL
BUN: 7 mg/dL (ref 6–23)
Calcium: 8.5 mg/dL (ref 8.4–10.5)
Creatinine, Ser: 0.6 mg/dL (ref 0.4–1.2)
GFR: 111.84 mL/min (ref >60.00)
Glucose, Bld: 79 mg/dL (ref 70–99)

## 2010-09-23 LAB — CBC WITH DIFFERENTIAL/PLATELET
Basophils Absolute: 0 10*3/uL (ref 0.0–0.1)
Eosinophils Absolute: 0 10*3/uL (ref 0.0–0.7)
Lymphocytes Relative: 31.1 % (ref 12.0–46.0)
Lymphs Abs: 1.3 10*3/uL (ref 0.7–4.0)
MCHC: 33.8 g/dL (ref 30.0–36.0)
Monocytes Relative: 6.4 % (ref 3.0–12.0)
Platelets: 140 10*3/uL — ABNORMAL LOW (ref 150.0–400.0)
RDW: 15.4 % — ABNORMAL HIGH (ref 11.5–14.6)

## 2010-09-23 LAB — POCT URINALYSIS DIPSTICK
Bilirubin, UA: NEGATIVE
Ketones, UA: NEGATIVE
Spec Grav, UA: 1.02

## 2010-09-23 LAB — HEPATIC FUNCTION PANEL
Albumin: 3.4 g/dL — ABNORMAL LOW (ref 3.5–5.2)
Alkaline Phosphatase: 145 U/L — ABNORMAL HIGH (ref 39–117)
Bilirubin, Direct: 0.1 mg/dL (ref 0.0–0.3)

## 2010-09-23 LAB — MICROALBUMIN / CREATININE URINE RATIO: Microalb, Ur: 5.8 mg/dL — ABNORMAL HIGH (ref 0.0–1.9)

## 2010-09-23 LAB — HEMOGLOBIN A1C: Hgb A1c MFr Bld: 6.6 % — ABNORMAL HIGH (ref 4.6–6.5)

## 2010-09-24 NOTE — Assessment & Plan Note (Signed)
[facility]                             PULMONARY OFFICE NOTE   NAME:[name], [name]                       MRN:          [id number]  DATE:04/09/2007                            DOB:          March 27, 1956    PROBLEM LIST:  1. Obstructive sleep apnea.  2. Rhinitis.  3. Hypertension.   HISTORY:  She says at the beginning CPAP was great.  She was using  auto titration and was indicated for pressure range around 14, so she  was switched to a fixed pressure machine at that setting.  She does not  distinguish between the modalities, but says that her machine has begun  lunging back and forth in its apparent pressures, and this is  disturbing and uncomfortable.  Something has changed, but I cannot tell  that it was related to the change from auto titration to fixed.  She  needs to have the machine looked at by the home care company, and in the  meantime has stopped wearing it.   MEDICATIONS:  Her list is reviewed with no changes noted.   OBJECTIVE:  Weight 220 pounds, BP 106/68, pulse 70, room air saturation  95%.  She is overweight, alert.  There are no pressure marks on her face from the mask, no evident nasal  congestion, breathing is unlabored.  Voice quality normal with no  stridor.  Heart sounds are normal.  No edema.  No restlessness or tremor.   IMPRESSION:  Obstructive sleep apnea.  Something has changed with the  continuous positive airway pressure.  She is happy with her nasal  pillows mask.  I suspect the problem may be with the particular machine,  and that needs to be checked mechanically.   PLAN:  [facility] is going to look at replacing her continuous  positive airway pressure machine if necessary, and also reducing fixed  pressure to 13 CWP.  I have discussed all this with the patient.  Schedule return 4 months.     Clinton D. Maple Hudson, MD, Tonny Bollman, FACP  Electronically Signed    CDY/MedQ  DD: 04/10/2007  DT: 04/10/2007  Job #:  [id number]   cc:   Neta Mends. Fabian Sharp, MD

## 2010-09-24 NOTE — Assessment & Plan Note (Signed)
[name]                             PULMONARY OFFICE NOTE   NAME:TALLEYDulcemaria, [name]                       MRN:          [id number]  DATE:01/04/2007                            DOB:          [birth year]    SLEEP MEDICINE CONSULTATION   PROBLEM:  A 55 year old woman referred through the courtesy of Dr.  Fabian Sharp in sleep medicine consultation, concerned about loud snoring and  daytime sleepiness.   HISTORY:  For several years, she has been snoring quite loudly with  complaints from her husband.  She awakened herself once chocking  recently, but that has not been routine.  She is mostly concerned about  significant daytime sleepiness, which does effect her driving.  Her  husband works third shift, and that contributes to sleep disruption.  When she wakes at night, she tends to want to eat.  Bed time is between  8 and 9 p.m.  Sometimes needing 2 to 3 hours to fall asleep and waking  twice before getting up at 4:45 a.m.   MEDICATIONS:  1. L Thyroxine 112 mcg.  2. Omeprazole 20 mg once or twice daily.  3. Zetia 10 mg.  4. Crestor 10 mg.  5. Potassium ER 10 mEq.  6. Hydrochlorothiazide 25 mg.  7. Bupropion 150 mg 1 or 2 daily.  8. Metoprolol ER 100 mg.  9. Pristiq 50 mg daily.  10.Aspirin.   DRUG INTOLERANCES:  TETRACYCLINE.   REVIEW OF SYSTEMS:  No nasal obstruction.  Some nonproductive cough.  Occasional indigestion.  Coughs at night.  Night mares.  Depression.  Otherwise, as per HPI.  Weight has gone up several pounds.   PAST HISTORY:  Hypertension.  Elevated cholesterol.  Deep vein  thrombosis left leg, treated in the past with Coumadin.  No ear, nose,  and throat surgery.  Hypothyroidism.  Cholecystectomy.  Tubal ligation.  Breast biopsy.  Foot surgery.  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   SOCIAL HISTORY:  Nonsmoker.  One cup of coffee.  One coke per day for  caffeine.  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  Her husband had sleep apnea surgery by Dr. Lucky Cowboy.   FAMILY HISTORY:  Allergies, asthma, heart disease, cancer.  Relatives  not known to have sleep apnea.   OBJECTIVE:  Weight 222 pounds, BP 124/82, pulse 74, room air saturation  98%.  Quiet but alert and appropriate woman, moderately overweight.  HEENT:  Small mandible.  Palate spacing 4/4.  No stridor or thyromegaly.  Speech quality normal.  Nasal airway not obstructed.  CHEST:  Clear.  HEART:  Sounds are regular without murmur.  EXTREMITIES:  Without cyanosis, clubbing, or edema.   IMPRESSION:  She almost certainly has obstructive sleep apnea.   PLAN:  We discussed the basics of sleep apnea and reviewed the technique  of overnight sleep study.  We are scheduling a nocturnal polysomnogram  with split protocol at the [facility], and she will return after  that is completed.  I appreciate the chance to meet her.     Clinton D. Maple Hudson, MD, Tonny Bollman, FACP  Electronically Signed    CDY/MedQ  DD: 01/04/2007  DT: 01/05/2007  Job #: [id number]   cc:   Neta Mends. Fabian Sharp, MD  [facility]

## 2010-09-24 NOTE — Procedures (Signed)
[name], [name]                ACCOUNT NO.:  [id number]   MEDICAL RECORD NO.:  [id number]          PATIENT TYPE:  OUT   LOCATION:  SLEEP CENTER                 FACILITY[facility]  [facility]   PHYSICIAN:  Clinton D. Maple Hudson, MD, FCCP, FACPDATE OF BIRTH:  November 16, 1953   DATE OF STUDY:  01/21/2007                            NOCTURNAL POLYSOMNOGRAM   REFERRING PHYSICIAN:  Clinton D. Young, MD, FCCP, FACP   INDICATION FOR STUDY:  Insomnia with sleep apnea.   EPWORTH SLEEPINESS SCORE:  10/24, BMI 36.6, weight 220 pounds.   MEDICATIONS:  Home medications are listed and reviewed.   SLEEP ARCHITECTURE:  Total sleep time 210 minutes and sleep efficiency  55%.  Stage I was 16%, Stage II 84%.  Stages III and REM were absent.  Sleep latency 7 minutes.  Awake after sleep onset 159 minutes, arousal  index 10.  Crestor Zetia and Omeprazole were taken at bedtime.   RESPIRATORY DATA:  Apnea/hypopnea index (AHI, RDI) 25.1 obstructive  events per hour indicating moderate obstructive sleep apnea/hypopnea  syndrome.  There were 23 obstructive apneas and 65 hypopneas.  Events  were not positional.  The patient did not tolerate initial exposure to  CPAP and would not proceed with CPAP titration, stating she felt  suffocated.  The technician tried to work with change of masks and  pressures.  The patient complained of nasal congestion saying, That  happens frequently at home.   OXYGEN DATA:  Moderate snoring with oxygen desaturation nadir of 87%.  Mean oxygen saturation through the study was 94% on room air.   CARDIAC DATA:  Normal sinus rhythm.   MOVEMENT-PARASOMNIA:  A total of 96 limb jerks were recorded, of which  none were associated with arousal or awakening.   IMPRESSIONS-RECOMMENDATIONS:  1. Moderate obstructive sleep apnea/hypopnea syndrome, AHI 25.1 per      hour with nonpositional events, moderate snoring, and oxygen      desaturation nadir of 87%.  2. The patient was intolerant of initial exposure  to CPAP and would      not proceed with CPAP titration by protocol.  Consider alternative      therapies.      Clinton D. Maple Hudson, MD, [facility], FACP  Diplomate, Biomedical engineer of Sleep Medicine  Electronically Signed     CDY/MEDQ  D:  02/01/2007 19:44:38  T:  02/02/2007 11:23:26  Job:  [id number]

## 2010-09-24 NOTE — Assessment & Plan Note (Signed)
[facility]                             PULMONARY OFFICE NOTE   NAME:[name], [name]                       MRN:          [id number]  DATE:02/02/2007                            DOB:          Oct 13, 1955    PROBLEM LIST:  1. Obstructive sleep apnea.  2. Rhinitis.  3. Hypertension.   HISTORY:  She comes now to follow up after her sleep study done  September 11, which demonstrated moderate obstructive apnea with an  index of 25.1 per hour, moderate snoring, and oxygen desaturation to  87%.  She proved to be intensely claustrophobic, and could not tolerate  the initial CPAP trials for titration on that study night.  She thinks  she might be able to work with a nasal pillows type mask.  In the last 3  days, she has had incidental increased nasal congestion, so far without  sore throat, but also without purulent discharge, headache or sneezing.   Her medication list is charted and reviewed, noting history of  hypothyroidism which is treated.   OBJECTIVE:  Weight 223 pounds, BP 132/80, pulse 69, room air saturation  97%.  She is quite alert.  There is moderate turbinate edema.  She has a small mandible with  posteriorly placed soft palate and spacing 3-4/4.  No stridor.   IMPRESSION:  Moderate obstructive sleep apnea, complicated by  claustrophobia.  We will regard her nasal congestion for now as  transient.  After some discussion, I am hopeful that we might be able to  desensitize her to continuous positive airway pressure, and we are going  to begin an auto titration process, steering her towards initial trial  with a nasal pillows type mask.  I would also like to supplement these  efforts with alprazolam 0.25 mg at h.s. p.r.n. for anxiety and sleep  only while using continuous positive airway pressure.  We will also give  her contact information for an oral appliance as an alternative.  Today  she is given a sample of Veramyst for trial at 2 sprays  each nostril  daily.  Schedule return 6 weeks, earlier p.r.n.     Clinton D. Maple Hudson, MD, Tonny Bollman, FACP  Electronically Signed    CDY/MedQ  DD: 02/02/2007  DT: 02/03/2007  Job #: [id number]   cc:   Rhonda Mends. Fabian Sharp, MD

## 2010-09-26 ENCOUNTER — Encounter: Payer: Self-pay | Admitting: Internal Medicine

## 2010-09-26 DIAGNOSIS — K7460 Unspecified cirrhosis of liver: Secondary | ICD-10-CM | POA: Insufficient documentation

## 2010-09-27 ENCOUNTER — Ambulatory Visit (INDEPENDENT_AMBULATORY_CARE_PROVIDER_SITE_OTHER): Payer: Commercial Managed Care - PPO | Admitting: Internal Medicine

## 2010-09-27 ENCOUNTER — Encounter: Payer: Self-pay | Admitting: Internal Medicine

## 2010-09-27 VITALS — BP 140/74 | HR 66 | Ht 65.0 in | Wt 213.0 lb

## 2010-09-27 DIAGNOSIS — E119 Type 2 diabetes mellitus without complications: Secondary | ICD-10-CM

## 2010-09-27 DIAGNOSIS — E785 Hyperlipidemia, unspecified: Secondary | ICD-10-CM

## 2010-09-27 DIAGNOSIS — R0609 Other forms of dyspnea: Secondary | ICD-10-CM

## 2010-09-27 DIAGNOSIS — R0989 Other specified symptoms and signs involving the circulatory and respiratory systems: Secondary | ICD-10-CM

## 2010-09-27 DIAGNOSIS — I10 Essential (primary) hypertension: Secondary | ICD-10-CM

## 2010-09-27 DIAGNOSIS — E039 Hypothyroidism, unspecified: Secondary | ICD-10-CM

## 2010-09-27 DIAGNOSIS — F438 Other reactions to severe stress: Secondary | ICD-10-CM

## 2010-09-27 DIAGNOSIS — K769 Liver disease, unspecified: Secondary | ICD-10-CM

## 2010-09-27 DIAGNOSIS — G4733 Obstructive sleep apnea (adult) (pediatric): Secondary | ICD-10-CM

## 2010-09-27 DIAGNOSIS — F4389 Other reactions to severe stress: Secondary | ICD-10-CM

## 2010-09-27 DIAGNOSIS — D509 Iron deficiency anemia, unspecified: Secondary | ICD-10-CM

## 2010-09-27 DIAGNOSIS — Z0000 Encounter for general adult medical examination without abnormal findings: Secondary | ICD-10-CM

## 2010-09-27 DIAGNOSIS — R21 Rash and other nonspecific skin eruption: Secondary | ICD-10-CM

## 2010-09-27 DIAGNOSIS — K7460 Unspecified cirrhosis of liver: Secondary | ICD-10-CM

## 2010-09-27 MED ORDER — TELMISARTAN 40 MG PO TABS: 40.0000 mg | ORAL_TABLET | Freq: Every day | ORAL | Status: DC

## 2010-09-27 NOTE — H&P (Signed)
[name], [name]                ACCOUNT NO.:  [id number]   MEDICAL RECORD NO.:  [id number]          PATIENT TYPE:  INP   LOCATION:  1824                         FACILITY:  [facility]   PHYSICIAN:  Sean A. Everardo All, M.D. [facility] OF BIRTH:  23-May-1955   DATE OF ADMISSION:  12/08/2004  DATE OF DISCHARGE:                                HISTORY & PHYSICAL   REASON FOR ADMISSION:  DVT.   HISTORY OF PRESENT ILLNESS:  A 55 year old woman with 21 days of moderate  pain at the left lower leg.  No associated symptoms.   PAST MEDICAL HISTORY:  1.  Anxiety/depression.  2.  Dyslipidemia.  3.  Hypothyroidism.  4.  GERD.  5.  Hypertension.   MEDICATIONS:  BuSpar, Zoloft, Toprol, Nexium, Synthroid, Zetia, Crestor,  aspirin, and hydrochlorothiazide, dosages uncertain.   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  She is married.   FAMILY HISTORY:  Negative for DVT.   REVIEW OF SYSTEMS:  Denies the following, nausea, vomiting,shortness of  breath, chest pain, fever, abdominal pain, rectal bleeding, hematuria,  dysuria, loss of consciousness, headache, and visual loss.   PHYSICAL EXAMINATION:  VITAL SIGNS: Blood pressure 146/90, heart rate 85,  respiratory rate 12, temperature 97.2.  GENERAL:  No distress.  SKIN:  Not diaphoretic.  HEENT:  No proptosis, no periorbital swelling.  Pharynx: No erythema.  NECK:  Supple.  CHEST: Clear to auscultation.  CARDIOVASCULAR:  No JVD.  There is 2+ edema on the left, none on the right.  Regular rate and rhythm, no murmur.  Pedal pulses intact bilaterally.  ABDOMEN:  Soft, obese, nontender.  No hepatosplenomegaly, no mass.  BREASTS/GYNECOLOGIC/RECTAL:  Examinations not done at this time due to  patient's condition.  EXTREMITIES:  The left leg is slightly swollen, warm, red, and tender.  NEUROLOGIC:  Alert and oriented.  Cranial nerves appear to be intact.  Readily moves all fours.   LABORATORY DATA:   Venous Doppler of the left leg is positive for DVT.   IMPRESSION:  1.  Deep vein thrombosis.  2.  Her hypertension probably has a situational component.  3.  Other chronic medical problems as noted.   PLAN:  1.  Heparin.  2.  Coumadin.  3.  Continue her PPI therapy.  4.  Bed rest with elevation of the left leg.  5.  I discussed code status with patient, and she does not wish to limit her      code status right now.  6.  I will do further research with the patient to see if I can ascertain      her outpatient medication dosages.  If not, I will prescribe them at      empiric dosages until I know for certain what her outpatient dosages      are.       SAE/MEDQ  D:  12/08/2004  T:  12/08/2004  Job:  [id number]   cc:   Loreen Freud, M.D.

## 2010-09-27 NOTE — Op Note (Signed)
[place]  Patient:    [name], PACINI                         MRN: [id number] Proc. Date: 11/09/00 Attending:  Vikki Ports, M.D. CC:         Titus Dubin. Alwyn Ren, M.D. [facility]   Operative Report  PREOPERATIVE DIAGNOSIS:  Abnormal left mammogram.  POSTOPERATIVE DIAGNOSIS:  Abnormal left mammogram.  PROCEDURE:  Needle localization left breast excisional biopsy.  SURGEON:  Vikki Ports, M.D.  ANESTHESIA:  Local MAC.  DESCRIPTION OF PROCEDURE:  The patient was taken to the operating room and placed in the supine position.  After adequate anesthesia was induced using MAC technique, the left breast was prepped and draped in the normal sterile fashion.  A curvilinear incision was made just inferior to the wire in the 12 oclock position of the left breast after the skin and subcutaneous tissue had been adequately anesthetized.  I dissected down onto the reinforced segmental wire which was quite deep.  All tissue surrounding the wire was excised in its entirely and sent for specimen mammography which verified the presence of a lesion.  Adequate hemostasis was ensured, and the skin was closed with subcuticular 4-0 Monocryl.  Steri-Strips and sterile dressing was applied.  The patient tolerated the procedure well and went to PACU in good condition. DD:  11/09/00 TD:  11/09/00 Job: [id number]

## 2010-09-27 NOTE — Patient Instructions (Addendum)
Will contact Dr Juanda Chance about use of meds for lipids c ontrol at this point we are avoiding the statin med because of the liver issue. Need better blood pressure control. Trial of micardis once a day call if you get a cough .   Will get cardiology  consults  And  dermatology to see you Continue  good blood sugar control.   Then ROV .  2 months

## 2010-09-27 NOTE — Discharge Summary (Signed)
[name], [name]                ACCOUNT NO.:  [id number]   MEDICAL RECORD NO.:  [id number]          PATIENT TYPE:  INP   LOCATION:  5030                         FACILITY:  [facility]   PHYSICIAN:  Rene Paci, M.D. LHCDATE OF BIRTH:  [birth year]   DATE OF ADMISSION:  12/08/2004  DATE OF DISCHARGE:  12/10/2004                                 DISCHARGE SUMMARY   DISCHARGE DIAGNOSIS:  Left lower extremity deep vein thrombosis.   HISTORY OF PRESENT ILLNESS:  The patient is a 55 year old female who was  admitted with a 21-day history of moderate pain in the left lower extremity.  The patient was admitted for anticoagulation and further evaluation.   PAST MEDICAL HISTORY:  1.  Anxiety/depression.  2.  Dyslipidemia.  3.  Hypothyroidism.  4.  GERD.  5.  Hypertension.   COURSE OF HOSPITALIZATION:  LEFT LOWER EXTREMITY DEEP VEIN THROMBOSIS:  The patient underwent a venous  Doppler of the left lower extremity, which was positive for DVT.  The  patient was started on IV heparin as well as p.o. Coumadin.  Coumadin was  continued and heparin was changed to full-dose subcutaneous Lovenox.  At  this time case management is evaluating the patient's prescription coverage  to ensure that home Lovenox will be covered.  If so, plan to discharge the  patient to home on full dose of Lovenox as well as Coumadin, and patient to  follow up in the Coumadin clinic.   MEDICATIONS AT DISCHARGE:  1.  Lovenox 100 mg injections subcu every 12 hours.  2.  Coumadin 5 mg p.o. at bedtime.  3.  Nexium 40 mg p.o. daily.  4.  Zoloft to resume preadmission dosing.  5.  Synthroid 112 mcg p.o. daily.  6.  Zetia 10 mg p.o. daily.  7.  Hydrochlorothiazide 25 mg p.o. daily.  8.  Toprol XL 100 mg p.o. daily.   A prescription was given for 10 injections of 100 mg Lovenox with one refill  as well as 30 tablets of Coumadin 5 mg with 0 refills.   LABORATORY DATA AT DISCHARGE:  INR 1.5.   A follow-up appointment has  been scheduled for the patient to see Dr. Laury Axon,  her primary care, on August 8 at 11 a.m.  In addition, an appointment has  been scheduled for the patient in the Coumadin clinic, [address], on August 3 at 8:15 a.m.       MSO/MEDQ  D:  12/10/2004  T:  12/10/2004  Job:  [id number]   cc:   Loreen Freud, M.D.

## 2010-09-27 NOTE — Progress Notes (Signed)
Subjective:    Patient ID: [name], female    DOB: [birth year], 55 y.o.   MRN: [id number]  HPI Patient comes in today for a checkup and followup of multiple medical issues. Since her last visit she feels like she is doing much better. She only takes an occasional pill for anxiety. DM  she's changed her diet but still has something sweet at night. She denies any low blood sugars. Has cut down on sugary drinks. Only one small soda a day at the most no unusual infections or vision change is going to get an eye check. Denies any numbness in her feet. HT taking medication no side effect only taking the Lasix once a day. Her blood pressures went up a little bit. In the past she did get a cough with Benicar. There was never any swelling or edema. OSA: Feels she is doing well on CPAP Hypothyroid: Taking medication every day brand Synthroid Abnormal liver tests: Question autoimmune hepatitis versus other under care per Dr. Juanda Chance currently not on medications Hyperlipidemia:  Is not on a statin medicine because of above. Nutrition related note to consider ZEtia..   Review of Systems No changes in vision or hearing. She has noted a subtle dyspnea on exertion for the last year. No acute leg swelling cough or PND denies chest pain.  No blood in stool declines colonoscopy and Pap smear.  Left lower extremity where she had her blood clot is always tender. No new swelling or redness. No ulcers. Things are worse when her legs are down all day. Skin:  Has a blotchy rash in her upper arms and anterior chest. She's had this for a long time doesn't itch much but wonders what it is. She has always had darker skin ever since she was young so no major changes in her skin.     Objective:   Physical Exam Physical Exam: Vital signs reviewed BJY:NWGN is a well-developed well-nourished alert cooperative  white female who appears her stated age in no acute distress.  HEENT: normocephalic  traumatic , Eyes: PERRL  EOM's full, conjunctiva clear, Nares: paten,t no deformity discharge or tenderness., Ears: no deformity EAC's clear TMs with normal landmarks. Mouth: clear OP, no lesions, edema.  Moist mucous membranes. Dentition in adequate repair. NECK: supple without masses, thyromegaly or bruits. CHEST/PULM:  Clear to auscultation and percussion breath sounds equal no wheeze , rales or rhonchi. No chest wall deformities or tenderness. CV: PMI is nondisplaced, S1 S2 no gallops, murmurs, rubs. Peripheral pulses are full without delay.No JVD .  ABDOMEN: Bowel sounds normal nontender  No guard or rebound,  no CVA tenderness.  No hernia. Prominent abdomen and no ascites  Hepar at rcm ?  Hard to feel spleen Extremtities:  No clubbing cyanosis or edema, no acute joint swelling or redness no focal atrophy there are chronic changes in her left lower extremity from vascular changes she has a varicose vein in her left calf that is nontender or red no ulcers noted. The feet show no ulcers sensation to the mono filament intact.  NEURO:  Oriented x3, cranial nerves 3-12 appear to be intact, no obvious focal weakness,gait within normal limits no abnormal reflexes or asymmetrical SKIN:  normal turgor, darker color no change , no bruising or petechiae. Blotchy macular somewhat pink rash only on the upper arms and anterior chest in sun exposed areas. PSYCH: Oriented, good eye contact, no obvious depression anxiety, cognition and judgment appear normal. Breast: normal by inspection .  No dimpling, discharge, masses, tenderness or discharge . LN: no cervical axillary inguinal adenopathy EKG: NSR but non specific st changes  No major changes from previous  7 05         Assessment & Plan:  Preventive Health Care Declines colonoscopy and Pap smear up-to-date on immunizations  DM: Improved control no neuropathy or infection at present  HT: Not at goal had a history of a cough on Benicar. Because she did not have an allergic  reaction we will try another arband give her samples of Micardis 40 mg a day to try she will call if she gets a cough  LIVER:  Chronic autoimmune versus other  Obesity; needs to lose weight patient aware encouraged further weight loss OSA:  On CPAP continue. Hyperlipidemia: Problematic. Her liver disease at present is limiting our medication choices. We'll contact Dr. Juanda Chance and ask for opinion about lipid medications. Hypothyroid: Continue same medication Anxiety: Stable at present using medications rarely as needed Rash: This appears to be only in the upper proximal arms and anterior chest. Areas that may be uncovered at times. Does not really look like tinea. We'll get a dermatologist to see.  Dyspnea on exertion this is a subtle finding but possibly slowly increasing over time. Because of all of her high-risk disease is discussed further evaluation.  We'll do a cardiology referral consult.

## 2010-10-08 ENCOUNTER — Telehealth: Payer: Self-pay | Admitting: *Deleted

## 2010-10-08 DIAGNOSIS — E785 Hyperlipidemia, unspecified: Secondary | ICD-10-CM

## 2010-10-08 DIAGNOSIS — R945 Abnormal results of liver function studies: Secondary | ICD-10-CM

## 2010-10-08 MED ORDER — ATORVASTATIN CALCIUM 10 MG PO TABS: 10.0000 mg | ORAL_TABLET | Freq: Every day | ORAL | Status: DC

## 2010-10-08 NOTE — Telephone Encounter (Signed)
Left message for pt to call back  °

## 2010-10-08 NOTE — Telephone Encounter (Signed)
Pt wants to go ahead with taking lipitor. Rx sent to pharmacy and orders placed for lab work.

## 2010-10-08 NOTE — Telephone Encounter (Signed)
Message copied by Romualdo Bolk on Tue Oct 08, 2010  2:50 PM ------      Message from: [facility], [place] [facility]      Created: Mon Oct 07, 2010  6:59 PM      Regarding: lipids medication       Tell patient  Got the ok from Dr Juanda Chance to do a small trial of either lipitor or crestor .         Since lipitor is generic if she hasnt had a se of this in the past              Begin  5 mg ( 1/2 of 10 mg ) of lipitor  per day and repeat  lfts monthly x 3  .              Repeat lipids at the 2 months mark and then decide if can increase dose.     Disp 30 of 10 mg lipitor.

## 2010-10-10 ENCOUNTER — Other Ambulatory Visit: Payer: Self-pay | Admitting: Internal Medicine

## 2010-10-14 ENCOUNTER — Other Ambulatory Visit: Payer: Self-pay | Admitting: Internal Medicine

## 2010-10-14 ENCOUNTER — Telehealth: Payer: Self-pay | Admitting: *Deleted

## 2010-10-14 MED ORDER — ALPRAZOLAM 0.25 MG PO TABS: 0.2500 mg | ORAL_TABLET | Freq: Every evening | ORAL | Status: DC | PRN

## 2010-10-14 NOTE — Telephone Encounter (Signed)
Pt needs a refill on xanax.  Per Dr. Fabian Sharp- okay x 1.

## 2010-10-16 ENCOUNTER — Encounter: Payer: Self-pay | Admitting: Internal Medicine

## 2010-11-08 ENCOUNTER — Telehealth: Payer: Self-pay | Admitting: *Deleted

## 2010-11-08 DIAGNOSIS — K760 Fatty (change of) liver, not elsewhere classified: Secondary | ICD-10-CM

## 2010-11-08 DIAGNOSIS — K7460 Unspecified cirrhosis of liver: Secondary | ICD-10-CM

## 2010-11-08 NOTE — Telephone Encounter (Signed)
Message copied by Richardson Chiquito on Fri Nov 08, 2010  8:11 AM ------      Message from: Richardson Chiquito      Created: Tue Oct 08, 2010 11:30 AM       Actually, labs need to be completed around 11/25/10.             ----- Message -----         From: Vernia Buff, CMA         Sent: 10/07/2010           To: Vernia Buff            Patient neecds labs repeated around 10/2010... See 05/27/10 emr note.

## 2010-11-08 NOTE — Telephone Encounter (Signed)
Left message for patient to call back  

## 2010-11-11 ENCOUNTER — Ambulatory Visit (INDEPENDENT_AMBULATORY_CARE_PROVIDER_SITE_OTHER): Payer: Commercial Managed Care - PPO | Admitting: Internal Medicine

## 2010-11-11 ENCOUNTER — Encounter: Payer: Self-pay | Admitting: Internal Medicine

## 2010-11-11 DIAGNOSIS — R0602 Shortness of breath: Secondary | ICD-10-CM

## 2010-11-11 DIAGNOSIS — E785 Hyperlipidemia, unspecified: Secondary | ICD-10-CM

## 2010-11-11 DIAGNOSIS — I10 Essential (primary) hypertension: Secondary | ICD-10-CM

## 2010-11-11 NOTE — Patient Instructions (Signed)
Your physician wants you to follow-up in:October 2012 You will receive a reminder letter in the mail two months in advance. If you don't receive a letter, please call our office to schedule the follow-up appointment.; 

## 2010-11-14 NOTE — Telephone Encounter (Signed)
Left message to call back  

## 2010-11-14 NOTE — Assessment & Plan Note (Signed)
Adequate control. 

## 2010-11-14 NOTE — Assessment & Plan Note (Signed)
With the patient's history I think that a statin is indicated.  I would like to see what her lipids are when checked.  Will discuss with pharmacy other statins once results come in.

## 2010-11-14 NOTE — Assessment & Plan Note (Signed)
I am not convinced this is an anginal equivalent.  I do think she may be deconditioned.  Question also if related to liver issues.  I would follow.  If worsens, or with training does not improve, I would schedule noninvasive testing.

## 2010-11-14 NOTE — Progress Notes (Signed)
HPI Patient is a 55 year old with a history of cirrhosis and dyslipidemia.  She has no known history of CAD She was referred for evaluation of lipids. The patient was recently evaluated in medicine clinic.  LDL was noted to be 160.  She was started on lipitor 10. Since then she has had no complaints.   She denies CP.  No dizziness.  Notes some DOE. Is not active with an exercise program. Allergies  Allergen Reactions  . Olmesartan Medoxomil     REACTION: ? if cough  . Tetracycline Hcl     REACTION: unspecified  . Venlafaxine     REACTION: severe dry moouth    Current Outpatient Prescriptions  Medication Sig Dispense Refill  . ALPRAZolam (XANAX) 0.25 MG tablet Take 1 tablet (0.25 mg total) by mouth at bedtime as needed.  30 tablet  0  . amLODipine (NORVASC) 5 MG tablet Take 5 mg by mouth daily.        [name] aspirin 81 MG tablet Take 81 mg by mouth daily.        [name] atorvastatin (LIPITOR) 10 MG tablet Take 1 tablet (10 mg total) by mouth daily.  90 tablet  0  . buPROPion (WELLBUTRIN SR) 150 MG 12 hr tablet TAKE 1 TABLET BY MOUTH TWICE DAILY  180 tablet  0  . escitalopram (LEXAPRO) 10 MG tablet Take 10 mg by mouth daily.        . furosemide (LASIX) 20 MG tablet TAKE 1 TABLET BY MOUTH ONCE DAILY AS DIRECTED  90 tablet  1  . glimepiride (AMARYL) 2 MG tablet Take 2 mg by mouth daily before breakfast.        . glucose blood (TRUETEST TEST) test strip 1 each by Other route 2 (two) times daily. Use as instructed       . levothyroxine (SYNTHROID, LEVOTHROID) 125 MCG tablet Take 125 mcg by mouth daily.        [name] omeprazole (PRILOSEC) 20 MG capsule Take 20 mg by mouth 2 (two) times daily.       . saxagliptin HCl (ONGLYZA) 5 MG TABS tablet Take 5 mg by mouth daily.        [name] telmisartan (MICARDIS) 40 MG tablet Take 1 tablet (40 mg total) by mouth daily.  42 tablet  11  . azaTHIOprine (IMURAN) 50 MG tablet Take 150 mg by mouth daily.          Past Medical History  Diagnosis Date  . Depression   . DVT  (deep venous thrombosis)     hx of on HRT left leg  . GERD (gastroesophageal reflux disease)   . Hyperlipidemia   . Hypertension   . Hypothyroidism   . PPD positive, treated     rx inh   . OSA on CPAP   . Diabetes mellitus   . 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 Breast biopsy   . Tubal ligation   . Cholecystectomy   . Foot surgery   . Eye surgery   . Cataract extraction   . Percutaneous liver biopsy     Family History  Problem Relation Age of Onset  . Diabetes Mother   . Hypertension Mother   . Arthritis Mother   . Heart disease Mother   . Heart failure Mother   . Stroke Father   . Heart disease Father   . Diabetes Paternal Grandmother   .  Colon cancer Paternal Grandfather     History   Social History  . Marital Status: Married    Spouse Name: N/A    Number of Children: N/A  . Years of Education: N/A   Occupational History  . Not on file.   Social History Main Topics  . Smoking status: Never Smoker   . Smokeless tobacco: Not on file  . Alcohol Use: No  . Drug Use: No  . Sexually Active:    Other Topics Concern  . 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     Review of Systems:  All systems reviewed.  They are negative to the above problem except as previously stated.  Vital Signs: BP 128/78  Pulse 84  Resp 16  Ht 5\' 5"  (1.651 m)  Wt 212 lb (96.163 kg)  BMI 35.28 kg/m2  Physical Exam Patient isi NAD  HEENT:  Normocephalic, atraumatic. EOMI, PERRLA.  Neck: JVP is normal. No thyromegaly. No bruits.  Lungs: clear to auscultation. No rales no wheezes.  Heart: Regular rate and rhythm. Normal S1, S2. No S3.   No significant murmurs. PMI not displaced.  Abdomen:  Supple, nontender. Normal bowel sounds. No masses. No  hepatomegaly.  Extremities:   Good distal pulses throughout. No lower extremity edema.  Musculoskeletal :moving all extremities.  Neuro:   alert and oriented x3.  CN II-XII grossly intact.    Assessment and Plan:

## 2010-11-15 NOTE — Telephone Encounter (Signed)
Left message to call back  

## 2010-11-18 NOTE — Telephone Encounter (Signed)
Letter sent to patient.

## 2010-11-20 ENCOUNTER — Telehealth: Payer: Self-pay | Admitting: *Deleted

## 2010-11-20 NOTE — Telephone Encounter (Signed)
Patient called back. I have advised her that she is due for labs. Patient states that she is due for labs with Dr Fabian Sharp and she will get them done at that time.

## 2010-11-25 ENCOUNTER — Telehealth: Payer: Self-pay | Admitting: *Deleted

## 2010-11-25 ENCOUNTER — Other Ambulatory Visit: Payer: Self-pay | Admitting: Internal Medicine

## 2010-11-25 ENCOUNTER — Other Ambulatory Visit: Payer: Commercial Managed Care - PPO

## 2010-11-25 ENCOUNTER — Other Ambulatory Visit: Payer: Self-pay

## 2010-11-25 NOTE — Telephone Encounter (Signed)
Pt aware that samples are ready to pick up °

## 2010-11-25 NOTE — Telephone Encounter (Signed)
Pt aware that she needs a follow up appt and will call back to schedule this.

## 2010-11-25 NOTE — Telephone Encounter (Signed)
Left a message for patient that labs are due this week. 

## 2010-11-25 NOTE — Telephone Encounter (Signed)
Ok if we have any. 

## 2010-11-25 NOTE — Telephone Encounter (Signed)
Message copied by Daphine Deutscher on Mon Nov 25, 2010 10:32 AM ------      Message from: Daphine Deutscher      Created: Wed Jul 17, 2010 11:38 AM       Due for 6 months labs hepatic/liver function. Call and remind her

## 2010-11-25 NOTE — Telephone Encounter (Signed)
Pt needs some samples of Micardis

## 2010-12-05 ENCOUNTER — Other Ambulatory Visit: Payer: Self-pay | Admitting: Internal Medicine

## 2010-12-05 ENCOUNTER — Telehealth: Payer: Self-pay | Admitting: *Deleted

## 2010-12-05 NOTE — Telephone Encounter (Signed)
Message copied by Daphine Deutscher on Thu Dec 05, 2010  9:20 AM ------      Message from: Daphine Deutscher      Created: Mon Nov 25, 2010 10:34 AM       DID patient come for Liver function(DB)

## 2010-12-05 NOTE — Telephone Encounter (Signed)
Left a message for patient at her home and  Her cell number that she is due for labs.

## 2010-12-09 ENCOUNTER — Other Ambulatory Visit: Payer: Self-pay | Admitting: Internal Medicine

## 2011-01-02 ENCOUNTER — Other Ambulatory Visit: Payer: Self-pay | Admitting: Internal Medicine

## 2011-01-02 DIAGNOSIS — E119 Type 2 diabetes mellitus without complications: Secondary | ICD-10-CM

## 2011-01-08 ENCOUNTER — Other Ambulatory Visit (INDEPENDENT_AMBULATORY_CARE_PROVIDER_SITE_OTHER): Payer: Commercial Managed Care - PPO

## 2011-01-08 DIAGNOSIS — K7460 Unspecified cirrhosis of liver: Secondary | ICD-10-CM

## 2011-01-08 DIAGNOSIS — E785 Hyperlipidemia, unspecified: Secondary | ICD-10-CM

## 2011-01-08 DIAGNOSIS — E119 Type 2 diabetes mellitus without complications: Secondary | ICD-10-CM

## 2011-01-08 DIAGNOSIS — K7689 Other specified diseases of liver: Secondary | ICD-10-CM

## 2011-01-08 DIAGNOSIS — R945 Abnormal results of liver function studies: Secondary | ICD-10-CM

## 2011-01-08 DIAGNOSIS — K760 Fatty (change of) liver, not elsewhere classified: Secondary | ICD-10-CM

## 2011-01-08 LAB — HEPATIC FUNCTION PANEL
Albumin: 3.7 g/dL (ref 3.5–5.2)
Total Bilirubin: 0.6 mg/dL (ref 0.3–1.2)

## 2011-01-08 LAB — LIPID PANEL
HDL: 48.5 mg/dL (ref >39.00)
LDL Cholesterol: 134 mg/dL — ABNORMAL HIGH (ref 0–99)
Total CHOL/HDL Ratio: 4
Triglycerides: 80 mg/dL (ref 0.0–149.0)
VLDL: 16 mg/dL (ref 0.0–40.0)

## 2011-01-08 LAB — HEMOGLOBIN A1C: Hgb A1c MFr Bld: 6.4 % (ref 4.6–6.5)

## 2011-01-09 ENCOUNTER — Telehealth: Payer: Self-pay | Admitting: *Deleted

## 2011-01-09 NOTE — Telephone Encounter (Signed)
Message copied by Daphine Deutscher on Thu Jan 09, 2011  2:41 PM ------      Message from: Hart Carwin      Created: Wed Jan 08, 2011  7:08 PM       Please call pt with stable liver function abnormalities, no change from  09/22/2009

## 2011-01-09 NOTE — Telephone Encounter (Signed)
Patient notified of results as per Dr. Brodie. 

## 2011-01-10 NOTE — Progress Notes (Signed)
Pt aware of results and will call to make an appt.

## 2011-01-14 ENCOUNTER — Telehealth: Payer: Self-pay | Admitting: *Deleted

## 2011-01-14 MED ORDER — TELMISARTAN 40 MG PO TABS: 40.0000 mg | ORAL_TABLET | Freq: Every day | ORAL | Status: DC

## 2011-01-14 NOTE — Telephone Encounter (Signed)
Pt aware of results. Needs a refill on micardis. She is going to call back to make a follow up appointment in the next few weeks.

## 2011-01-17 ENCOUNTER — Telehealth: Payer: Self-pay | Admitting: *Deleted

## 2011-01-17 NOTE — Telephone Encounter (Signed)
Pt needs to have a late pm appt due to her work schedule to go over her labs. Please advise where we can put her.

## 2011-01-27 ENCOUNTER — Other Ambulatory Visit: Payer: Self-pay | Admitting: Internal Medicine

## 2011-01-27 LAB — HEMOGLOBIN A1C: Hgb A1c MFr Bld: 5.9 % (ref 4.0–6.0)

## 2011-02-05 ENCOUNTER — Telehealth: Payer: Self-pay | Admitting: *Deleted

## 2011-02-05 NOTE — Telephone Encounter (Signed)
Needs transderm patches for cruise #3 apply before cruise and removed every 72 hours as needed.

## 2011-02-06 MED ORDER — SCOPOLAMINE 1 MG/3DAYS TD PT72: 1.0000 | MEDICATED_PATCH | TRANSDERMAL | Status: DC

## 2011-02-06 NOTE — Telephone Encounter (Signed)
Per Dr. Fabian Sharp- ok to do. Rx sent to pharmacy

## 2011-02-19 NOTE — Telephone Encounter (Signed)
Can do the end of the month.

## 2011-02-20 ENCOUNTER — Other Ambulatory Visit: Payer: Self-pay | Admitting: Internal Medicine

## 2011-02-20 NOTE — Telephone Encounter (Signed)
Appt made for pt

## 2011-02-27 ENCOUNTER — Encounter: Payer: Self-pay | Admitting: Internal Medicine

## 2011-03-10 ENCOUNTER — Ambulatory Visit: Payer: Commercial Managed Care - PPO | Admitting: Internal Medicine

## 2011-03-17 ENCOUNTER — Other Ambulatory Visit: Payer: Self-pay | Admitting: Internal Medicine

## 2011-04-28 ENCOUNTER — Other Ambulatory Visit: Payer: Self-pay | Admitting: Internal Medicine

## 2011-06-06 ENCOUNTER — Telehealth: Payer: Self-pay | Admitting: Internal Medicine

## 2011-06-06 MED ORDER — HYDROCODONE-HOMATROPINE 5-1.5 MG/5ML PO SYRP: 5.0000 mL | ORAL_SOLUTION | Freq: Three times a day (TID) | ORAL | Status: AC | PRN

## 2011-06-06 NOTE — Telephone Encounter (Signed)
Pt called and has dry, hacky cough, drainage, head congestion. Pt is req a cough med for congestion to be called in to [facility] in [place].

## 2011-06-06 NOTE — Telephone Encounter (Signed)
rx called in and patient is aware 

## 2011-06-24 ENCOUNTER — Telehealth: Payer: Self-pay | Admitting: *Deleted

## 2011-06-24 MED ORDER — OMEPRAZOLE 20 MG PO CPDR: 20.0000 mg | DELAYED_RELEASE_CAPSULE | Freq: Two times a day (BID) | ORAL | Status: DC

## 2011-06-24 NOTE — Telephone Encounter (Signed)
Pt needs a refill on omeprazole and needs to have a cpx done late afternoon around 3:45pm works best. Rx sent to [facility] for 90 days only. Where can we work her in at?

## 2011-06-24 NOTE — Telephone Encounter (Signed)
Wed March 6  At 345

## 2011-06-25 NOTE — Telephone Encounter (Signed)
Pt aware of appt.

## 2011-07-03 ENCOUNTER — Encounter: Payer: Self-pay | Admitting: *Deleted

## 2011-07-03 DIAGNOSIS — R5383 Other fatigue: Secondary | ICD-10-CM | POA: Insufficient documentation

## 2011-07-07 ENCOUNTER — Telehealth: Payer: Self-pay | Admitting: Family Medicine

## 2011-07-07 NOTE — Telephone Encounter (Signed)
[facility] Triage Call Report Triage Record Num: [id number] Operator: Amy Head Patient Name: [name] Call Date & Time: 07/04/2011 8:10:01PM Patient Phone: [phone] PCP: Neta Mends. Panosh Patient Gender: Female PCP Fax : [phone] Patient DOB: 09-12-1955 [facility] Name[facility] Reason for Call: Caller: Suhana/Patient; PCP: Madelin Headings.; CB#: [phone]; Call regarding Hi blood pressure 127/174 pulse 97; Is having an Anxiety Attack. All emergent sxs per HTN and Anxiety protocol r/o. See MD within 4 hours disp. Pt states that she has Xanax 2.5MG  PO with instructions to take whenever needed for Anxiety. Advised pt to take one as directed and I would call her back in 1 hour to reassess. F/U call as promised. At time of callback, pt states that she is feeling much better. BP is 146/99, HR 96. Is sleepy and wants to go to sleep. All emergent sxs per HTN and Anxiety protocol r/o. Home care aedvice given. Protocol(s) Used: Hypertension, Diagnosed or Suspected Recommended Outcome per Protocol: Provide Home/Self Care Reason for Outcome: Single elevated blood pressure reading and has questions Care Advice: ~ Write down questions/concerns to review with provider at next visit. It is important to have blood pressure checked at least annually, or at each provider visit, especially if you have a history of high blood pressure in your family. ~ ~ HEALTH PROMOTION / MAINTENANCE Hypertension is usually diagnosed after 2 elevated blood pressure readings on separate occasions. Now is the time to review your health and decide on changes that will help you to live a healthier life style. These changes may include weight management, exercise, and reduced salt intake. High blood pressure can be controlled by taking all medication that your healthcare provider may prescribe and by following these measures. ~ If you have any side effects from the medication, do not stop it, but call  your provider and report the side effects. There are many different medications and types of medication that you can take. Work with your provider to find the one that has the least amount of side effects while controlling your blood pressure. ~ ~ Call your provider, if you have not already done so, to make an appointment for a blood pressure check. ~ CAUTIONS LIFESTYLE MODIFICATION FOR HYPERTENSION: - Weight reduction will help lower blood pressure in individuals who are 10% or more above their ideal body weight. - Eat foods low in saturated fat and sugar, and high in complex carbohydrates (vegetables, fruits, whole grains). - Drink alcohol only in moderate amounts (12 oz beer, 5 oz wine, or 1 1/2 oz of distilled alcohol such as vodka, gin, etc.) and not more than 2 drinks/day for men or 1 drink/day for women or lighter-weight persons. - Get at least 30 minutes of moderate aerobic exercise (using as much energy as walking 2 miles in 30 minutes) most days of the week, preferably daily. - Stop smoking to decrease risk for cardiovascular and pulmonary disease, as well as cancer. - Keep regularly scheduled appointments with your provider. ~ 07/04/2011 9:20:30PM Page 1 of 1 [email or web address] [facility] Triage Call Report Triage Record Num: [id number] Operator: Amy Head Patient Name: [name] Call Date & Time: 07/04/2011 8:10:01PM Patient Phone: [phone] PCP: Neta Mends. Panosh Patient Gender: Female PCP Fax : [phone] Patient DOB: 11-22-1955 [facility] Name[facility] Reason for Call: Caller: Melah/Patient; PCP: Madelin Headings.; CB#: [phone]; Call regarding Hi blood pressure 127/174 pulse 97; Is having an Anxiety Attack. All emergent sxs per HTN and Anxiety protocol r/o.  See MD within 4 hours disp. Pt states that she has Xanax 2.5MG  PO with instructions to take whenever needed for Anxiety. Advised pt to take one as directed and I would call her back in 1 hour to  reassess. F/U call as promised. At time of callback, pt states that she is feeling much better. BP is 146/99, HR 96. Is sleepy and wants to go to sleep. All emergent sxs per HTN and Anxiety protocol r/o. Home care aedvice given. Protocol(s) Used: Anxiety: Panic Recommended Outcome per Protocol: Call Provider within 12 Hours Reason for Outcome: History of panic attacks AND current episode NOT resolved Care Advice:

## 2011-07-11 ENCOUNTER — Other Ambulatory Visit: Payer: Self-pay | Admitting: Internal Medicine

## 2011-07-11 LAB — CBC WITH DIFFERENTIAL/PLATELET
Basophils Relative: 0 % (ref 0–1)
Eosinophils Absolute: 0.1 10*3/uL (ref 0.0–0.7)
Eosinophils Relative: 2 % (ref 0–5)
HCT: 40.9 % (ref 36.0–46.0)
Hemoglobin: 13.1 g/dL (ref 12.0–15.0)
Lymphs Abs: 1.6 10*3/uL (ref 0.7–4.0)
MCH: 26.9 pg (ref 26.0–34.0)
MCHC: 32 g/dL (ref 30.0–36.0)
MCV: 84 fL (ref 78.0–100.0)
Monocytes Absolute: 0.4 10*3/uL (ref 0.1–1.0)
Monocytes Relative: 7 % (ref 3–12)
Neutrophils Relative %: 60 % (ref 43–77)

## 2011-07-11 LAB — BASIC METABOLIC PANEL WITH GFR
BUN: 10 mg/dL (ref 6–23)
CO2: 23 mEq/L (ref 19–32)
GFR, Est African American: >89 mL/min
Glucose, Bld: 98 mg/dL (ref 70–99)
Potassium: 4.2 mEq/L (ref 3.5–5.3)

## 2011-07-11 LAB — LIPID PANEL
LDL Cholesterol: 163 mg/dL — ABNORMAL HIGH (ref 0–99)
Total CHOL/HDL Ratio: 5.2 Ratio
VLDL: 16 mg/dL (ref 0–40)

## 2011-07-11 LAB — HEMOGLOBIN A1C: Hgb A1c MFr Bld: 6.3 % — ABNORMAL HIGH (ref <5.7)

## 2011-07-11 LAB — VITAMIN B12: Vitamin B-12: 527 pg/mL (ref 211–911)

## 2011-07-11 LAB — HEPATIC FUNCTION PANEL
Bilirubin, Direct: 0.1 mg/dL (ref 0.0–0.3)
Indirect Bilirubin: 0.5 mg/dL (ref 0.0–0.9)
Total Bilirubin: 0.6 mg/dL (ref 0.3–1.2)

## 2011-07-11 LAB — TSH: TSH: 3.108 u[IU]/mL (ref 0.350–4.500)

## 2011-07-16 ENCOUNTER — Emergency Department (HOSPITAL_COMMUNITY)
Admission: EM | Admit: 2011-07-16 | Discharge: 2011-07-16 | Disposition: A | Discharge status: Home Health | Payer: 59 | Attending: Emergency Medicine | Admitting: Emergency Medicine

## 2011-07-16 ENCOUNTER — Telehealth: Payer: Self-pay | Admitting: Internal Medicine

## 2011-07-16 ENCOUNTER — Other Ambulatory Visit: Payer: Self-pay

## 2011-07-16 ENCOUNTER — Encounter: Payer: Commercial Managed Care - PPO | Admitting: Internal Medicine

## 2011-07-16 ENCOUNTER — Encounter (HOSPITAL_COMMUNITY): Payer: Self-pay

## 2011-07-16 DIAGNOSIS — E039 Hypothyroidism, unspecified: Secondary | ICD-10-CM | POA: Insufficient documentation

## 2011-07-16 DIAGNOSIS — E785 Hyperlipidemia, unspecified: Secondary | ICD-10-CM | POA: Insufficient documentation

## 2011-07-16 DIAGNOSIS — K219 Gastro-esophageal reflux disease without esophagitis: Secondary | ICD-10-CM | POA: Insufficient documentation

## 2011-07-16 DIAGNOSIS — K529 Noninfective gastroenteritis and colitis, unspecified: Secondary | ICD-10-CM

## 2011-07-16 DIAGNOSIS — R109 Unspecified abdominal pain: Secondary | ICD-10-CM | POA: Insufficient documentation

## 2011-07-16 DIAGNOSIS — R112 Nausea with vomiting, unspecified: Secondary | ICD-10-CM | POA: Insufficient documentation

## 2011-07-16 DIAGNOSIS — K5289 Other specified noninfective gastroenteritis and colitis: Secondary | ICD-10-CM | POA: Insufficient documentation

## 2011-07-16 DIAGNOSIS — I10 Essential (primary) hypertension: Secondary | ICD-10-CM | POA: Insufficient documentation

## 2011-07-16 DIAGNOSIS — E119 Type 2 diabetes mellitus without complications: Secondary | ICD-10-CM | POA: Insufficient documentation

## 2011-07-16 LAB — COMPREHENSIVE METABOLIC PANEL
ALT: 56 U/L — ABNORMAL HIGH (ref 0–35)
AST: 55 U/L — ABNORMAL HIGH (ref 0–37)
CO2: 19 mEq/L (ref 19–32)
Calcium: 9.7 mg/dL (ref 8.4–10.5)
Sodium: 135 mEq/L (ref 135–145)
Total Protein: 8.1 g/dL (ref 6.0–8.3)

## 2011-07-16 LAB — URINE MICROSCOPIC-ADD ON

## 2011-07-16 LAB — DIFFERENTIAL
Basophils Absolute: 0 10*3/uL (ref 0.0–0.1)
Eosinophils Relative: 1 % (ref 0–5)
Lymphocytes Relative: 7 % — ABNORMAL LOW (ref 12–46)
Neutrophils Relative %: 88 % — ABNORMAL HIGH (ref 43–77)

## 2011-07-16 LAB — CBC
MCV: 81.4 fL (ref 78.0–100.0)
Platelets: 177 10*3/uL (ref 150–400)
RDW: 14.8 % (ref 11.5–15.5)
WBC: 9.9 10*3/uL (ref 4.0–10.5)

## 2011-07-16 LAB — URINALYSIS, ROUTINE W REFLEX MICROSCOPIC
Glucose, UA: NEGATIVE mg/dL
Hgb urine dipstick: NEGATIVE
Specific Gravity, Urine: 1.017 (ref 1.005–1.030)

## 2011-07-16 MED ORDER — ONDANSETRON HCL 4 MG/2ML IJ SOLN
4.0000 mg | Freq: Four times a day (QID) | INTRAMUSCULAR | Status: DC | PRN
  Administered 2011-07-16: 4 mg via INTRAVENOUS
  Filled 2011-07-16: qty 2

## 2011-07-16 MED ORDER — PROMETHAZINE HCL 50 MG PO TABS: 25.0000 mg | ORAL_TABLET | Freq: Four times a day (QID) | ORAL | Status: DC | PRN

## 2011-07-16 MED ORDER — SODIUM CHLORIDE 0.9 % IV BOLUS (SEPSIS)
500.0000 mL | Freq: Once | INTRAVENOUS | Status: AC
  Administered 2011-07-16: 500 mL via INTRAVENOUS

## 2011-07-16 MED ORDER — SODIUM CHLORIDE 0.9 % IV BOLUS (SEPSIS)
1000.0000 mL | Freq: Once | INTRAVENOUS | Status: AC
  Administered 2011-07-16: 1000 mL via INTRAVENOUS

## 2011-07-16 NOTE — Telephone Encounter (Signed)
Pt had to cx her cpx for today. Pt is in ER with nausea and diarrhea. Pt wanted to make Dr Fabian Sharp aware.

## 2011-07-16 NOTE — ED Provider Notes (Signed)
History     CSN: [id number]  Arrival date & time 07/16/11  1116   First MD Initiated Contact with Patient 07/16/11 1147      Chief Complaint  Patient presents with  . Nausea, vomiting and abdominal pain    (Consider location/radiation/quality/duration/timing/severity/associated sxs/prior treatment) HPI  Patient is 56 yo lady with PMH of DM-II, HTN, hypothyroidism, who presents with nausea, vomiting and abdominal pain. Her symptoms started in this morning. She vomited twice clear liquid and had 5 watery bowel movements since this morning. There is no blood in her stool. She feels hot, no chills. Patient reports having sick contact with her daughter and granddaughter who have similar symptoms since two days ago.  Denies chest pain, SOB,  urgency, frequency, hematuria, joint pain or leg swelling.  Past Medical History  Diagnosis Date  . Depression   . DVT (deep venous thrombosis)     hx of on HRT left leg  . GERD (gastroesophageal reflux disease)   . Hyperlipidemia   . Hypertension   . Hypothyroidism   . PPD positive, treated     rx inh   . OSA on CPAP   . Diabetes mellitus   . 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 Breast biopsy   . Tubal ligation   . Cholecystectomy   . Foot surgery   . Eye surgery   . Cataract extraction   . Percutaneous liver biopsy     Family History  Problem Relation Age of Onset  . Diabetes Mother   . Hypertension Mother   . Arthritis Mother   . Heart disease Mother   . Heart failure Mother   . Stroke Father   . Heart disease Father   . Diabetes Paternal Grandmother   . Colon cancer Paternal Grandfather     History  Substance Use Topics  . Smoking status: Never Smoker   . Smokeless tobacco: Not on file  . Alcohol Use: No    OB History    Grav Para Term Preterm Abortions TAB SAB Ect Mult Living   2 2              Review of Systems  Constitutional: Positive for  fever. Negative for chills and diaphoresis.  HENT: Negative for nosebleeds, facial swelling, mouth sores, trouble swallowing, neck pain, tinnitus and ear discharge.   Eyes: Negative for pain and discharge.  Respiratory: Positive for cough. Negative for apnea, choking, chest tightness, shortness of breath, wheezing and stridor.   Cardiovascular: Negative for chest pain, palpitations and leg swelling.  Gastrointestinal: Positive for nausea, vomiting, abdominal pain and diarrhea. Negative for constipation, blood in stool, abdominal distention and anal bleeding.  Genitourinary: Negative for dysuria, urgency, frequency and vaginal discharge.  Musculoskeletal: Negative for myalgias, joint swelling and gait problem.  Skin: Negative for color change and wound.  Neurological: Negative for dizziness, tremors, seizures, syncope, facial asymmetry, light-headedness and numbness.  Hematological: Negative for adenopathy.  Psychiatric/Behavioral: Negative for behavioral problems and agitation.      Allergies  Olmesartan medoxomil; Tetracycline hcl; and Venlafaxine  Home Medications   Current Outpatient Rx  Name Route Sig Dispense Refill  . ALPRAZOLAM 0.25 MG PO TABS Oral Take 1 tablet (0.25 mg total) by mouth at bedtime as needed. 30 tablet 0  . AMLODIPINE BESYLATE 5 MG PO TABS Oral Take 5 mg by mouth daily.    . ASPIRIN 81 MG PO  TABS Oral Take 81 mg by mouth daily.      . BUPROPION HCL ER (SR) 150 MG PO TB12 Oral Take 150 mg by mouth daily.    [name] ESCITALOPRAM OXALATE 10 MG PO TABS Oral Take 10 mg by mouth daily.      . FUROSEMIDE 20 MG PO TABS Oral Take 20 mg by mouth daily.    [name] LEVOTHYROXINE SODIUM 125 MCG PO TABS Oral Take 125 mcg by mouth daily.    [name] OMEPRAZOLE 20 MG PO CPDR Oral Take 1 capsule (20 mg total) by mouth 2 (two) times daily. 180 capsule 0  . SAXAGLIPTIN HCL 5 MG PO TABS Oral Take 5 mg by mouth daily.    . TELMISARTAN 40 MG PO TABS Oral Take 40 mg by mouth daily.      BP 129/63   Pulse 107  Temp(Src) 97.9 F (36.6 C) (Oral)  Resp 20  SpO2 99%  Physical Exam  General: resting in bed, not in acute distress HEENT: PERRL, EOMI, no scleral icterus Cardiac: S1/S2, RRR, tachycardia,  No murmurs, gallops or rubs Pulm: Good air movement bilaterally, Clear to auscultation bilaterally, No rales, wheezing, rhonchi or rubs. Abd: Soft,  nondistended, mild tenderness over lower abdomen diffusely, no rebound pain, no organomegaly. Decreased BS.  Ext: No rashes or edema, 2+DP/PT pulse bilaterally Neuro: alert and oriented X3, cranial nerves II-XII grossly intact, muscle strength 5/5 in all extremeties,  sensation to light touch intact.    EKG at 11:24 AM  Date: 07/16/2011  Rate: 155  Rhythm: sinus tachycardia  QRS Axis: normal  Intervals: normal  ST/T Wave abnormalities: normal  Conduction Disutrbances:none  Narrative Interpretation:   Old EKG Reviewed: unchanged    Repeat EKG at 13:54 PM  Date: 07/16/2011  Rate: 103  Rhythm: normal sinus rhythm  QRS Axis: normal  Intervals: normal  ST/T Wave abnormalities: nonspecific T wave changes  Conduction Disutrbances:none  Narrative Interpretation:   Old EKG Reviewed: unchanged    ED Course  Procedures (including critical care time)  Patient's tachycardia improved after 1 L of normal saline. Her heart rate decreased from 155 to 103/min.  Her initial and repeated EKG did not have ischemic change. Her inverted T wave in lead II was not changed compared to previous EKG on 12/09/2004.   After giving Zofran, patient's nausea becomes better. Will give 500 ml of NS due to still tachycardia at 2:30 PM.  CMP: Normal for Na, K, CL, HCO3 19, BUN , Cre, Calcium, protein, albumin and total bilirubin. AST 55 and ALT 56, AKP 163 CBC with diff: no leukoctosis Lipase 45   Labs Reviewed  CBC - Abnormal; Notable for the following:    RBC 5.26 (*)    All other components within normal limits  DIFFERENTIAL - Abnormal; Notable for  the following:    Neutrophils Relative 88 (*)    Neutro Abs 8.7 (*)    Lymphocytes Relative 7 (*)    All other components within normal limits  COMPREHENSIVE METABOLIC PANEL - Abnormal; Notable for the following:    Glucose, Bld 135 (*)    AST 55 (*)    ALT 56 (*)    Alkaline Phosphatase 163 (*)    All other components within normal limits  URINALYSIS, ROUTINE W REFLEX MICROSCOPIC - Abnormal; Notable for the following:    Leukocytes, UA MODERATE (*)    All other components within normal limits  URINE MICROSCOPIC-ADD ON - Abnormal; Notable for the following:  Squamous Epithelial / LPF FEW (*)    All other components within normal limits  LIPASE, BLOOD   No results found.   No diagnosis found.    MDM  Patient's condition improved. Her tachycardia improved after 1.5 L of IV normal saline. Her heart rate decreased from 155 to about 100/min.  Her initial and repeated EKG did not have ischemic change. Her inverted T wave in lead II was not changed compared to previous EKG on 12/09/2004. After giving Zofran, patient's nausea becomes better. Her lab tests showed normal lipase and electrolytes, no leukocytosis. She is afebrile. After discussed with Dr. Rosalia Hammers, patient is discharged home with instruction for follow up with her PCP and referral to cardiology due to SVT.      Lorretta Harp, MD 07/16/11 [phone]

## 2011-07-16 NOTE — ED Notes (Signed)
Pt presents with onset of abdominal pain, nausea, vomiting and diarrhea since this morning.  Pt reports family members with same.

## 2011-07-16 NOTE — Discharge Instructions (Signed)
1. Please follow up with your PCP in 1 or 2 weeks. 2. Please follow up with cardiologist. You are given a referral to cardiology.  Diet for Diarrhea, Adult Having frequent, runny stools (diarrhea) has many causes. Diarrhea may be caused or worsened by food or drink. Diarrhea may be relieved by changing your diet. IF YOU ARE NOT TOLERATING SOLID FOODS:  Drink enough water and fluids to keep your urine clear or pale yellow.   Avoid sugary drinks and sodas as well as milk-based beverages.   Avoid beverages containing caffeine and alcohol.   You may try rehydrating beverages. You can make your own by following this recipe:    tsp table salt.    tsp baking soda.   ? tsp salt substitute (potassium chloride).   1 tbs + 1 tsp sugar.   1 qt water.  As your stools become more solid, you can start eating solid foods. Add foods one at a time. If a certain food causes your diarrhea to get worse, avoid that food and try other foods. A low fiber, low-fat, and lactose-free diet is recommended. Small, frequent meals may be better tolerated.  Starches  Allowed:  White, [place], and pita breads, plain rolls, buns, bagels. Plain muffins, matzo. Soda, saltine, or graham crackers. Pretzels, melba toast, zwieback. Cooked cereals made with water: cornmeal, farina, cream cereals. Dry cereals: refined corn, wheat, rice. Potatoes prepared any way without skins, refined macaroni, spaghetti, noodles, refined rice.   Avoid:  Bread, rolls, or crackers made with whole wheat, multi-grains, rye, bran seeds, nuts, or coconut. Corn tortillas or taco shells. Cereals containing whole grains, multi-grains, bran, coconut, nuts, or raisins. Cooked or dry oatmeal. Coarse wheat cereals, granola. Cereals advertised as "high-fiber." Potato skins. Whole grain pasta, wild or brown rice. Popcorn. Sweet potatoes/yams. Sweet rolls, doughnuts, waffles, pancakes, sweet breads.  Vegetables  Allowed: Strained tomato and vegetable  juices. Most well-cooked and canned vegetables without seeds. Fresh: Tender lettuce, cucumber without the skin, cabbage, spinach, bean sprouts.   Avoid: Fresh, cooked, or canned: Artichokes, baked beans, beet greens, broccoli, Brussels sprouts, corn, kale, legumes, peas, sweet potatoes. Cooked: Green or red cabbage, spinach. Avoid large servings of any vegetables, because vegetables shrink when cooked, and they contain more fiber per serving than fresh vegetables.  Fruit  Allowed: All fruit juices except prune juice. Cooked or canned: Apricots, applesauce, cantaloupe, cherries, fruit cocktail, grapefruit, grapes, kiwi, mandarin oranges, peaches, pears, plums, watermelon. Fresh: Apples without skin, ripe banana, grapes, cantaloupe, cherries, grapefruit, peaches, oranges, plums. Keep servings limited to  cup or 1 piece.   Avoid: Fresh: Apple with skin, apricots, mango, pears, raspberries, strawberries. Prune juice, stewed or dried prunes. Dried fruits, raisins, dates. Large servings of all fresh fruits.  Meat and Meat Substitutes  Allowed: Ground or well-cooked tender beef, ham, veal, lamb, pork, or poultry. Eggs, plain cheese. Fish, oysters, shrimp, lobster, other seafoods. Liver, organ meats.   Avoid: Tough, fibrous meats with gristle. Peanut butter, smooth or chunky. Cheese, nuts, seeds, legumes, dried peas, beans, lentils.  Milk  Allowed: Yogurt, lactose-free milk, kefir, drinkable yogurt, buttermilk, soy milk.   Avoid: Milk, chocolate milk, beverages made with milk, such as milk shakes.  Soups  Allowed: Bouillon, broth, or soups made from allowed foods. Any strained soup.   Avoid: Soups made from vegetables that are not allowed, cream or milk-based soups.  Desserts and Sweets  Allowed: Sugar-free gelatin, sugar-free frozen ice pops made without sugar alcohol.   Avoid: Plain cakes and  cookies, pie made with allowed fruit, pudding, custard, cream pie. Gelatin, fruit, ice, sherbet, frozen  ice pops. Ice cream, ice milk without nuts. Plain hard candy, honey, jelly, molasses, syrup, sugar, chocolate syrup, gumdrops, marshmallows.  Fats and Oils  Allowed: Avoid any fats and oils.   Avoid: Seeds, nuts, olives, avocados. Margarine, butter, cream, mayonnaise, salad oils, plain salad dressings made from allowed foods. Plain gravy, crisp bacon without rind.  Beverages  Allowed: Water, decaffeinated teas, oral rehydration solutions, sugar-free beverages.   Avoid: Fruit juices, caffeinated beverages (coffee, tea, soda or pop), alcohol, sports drinks, or lemon-lime soda or pop.  Condiments  Allowed: Ketchup, mustard, horseradish, vinegar, cream sauce, cheese sauce, cocoa powder. Spices in moderation: allspice, basil, bay leaves, celery powder or leaves, cinnamon, cumin powder, curry powder, ginger, mace, marjoram, onion or garlic powder, oregano, paprika, parsley flakes, ground pepper, rosemary, sage, savory, tarragon, thyme, turmeric.   Avoid: Coconut, honey.  Weight Monitoring: Weigh yourself every day. You should weigh yourself in the morning after you urinate and before you eat breakfast. Wear the same amount of clothing when you weigh yourself. Record your weight daily. Bring your recorded weights to your clinic visits. Tell your caregiver right away if you have gained 3 lb/1.4 kg or more in 1 day, 5 lb/2.3 kg in a week, or whatever amount you were told to report. SEEK IMMEDIATE MEDICAL CARE IF:   You are unable to keep fluids down.   You start to throw up (vomit) or diarrhea keeps coming back (persistent).   Abdominal pain develops, increases, or can be felt in one place (localizes).   You have an oral temperature above 102 F (38.9 C), not controlled by medicine.   Diarrhea contains blood or mucus.   You develop excessive weakness, dizziness, fainting, or extreme thirst.  MAKE SURE YOU:   Understand these instructions.   Will watch your condition.   Will get help right  away if you are not doing well or get worse.  Document Released: 07/19/2003 Document Revised: 04/17/2011 Document Reviewed: 11/09/2008 [facility] Patient Information 2012 [place], [place].

## 2011-07-16 NOTE — ED Provider Notes (Signed)
I saw and evaluated the patient with Dr. Youlanda Roys.  Patient does not have any abdominal tenderness on exam. EKG done for evaluation as consistent with SVT. Repeat EKG shows a sinus tachycardia without any evidence of acute ischemia. Patient states that she has had episodes of feeling her heart is beating adequately in the past. She'll be given a referral to cardiology for this nausea and vomiting and diarrhea has resolved. She has been rehydrated with IV fluids is taking by mouth without difficulty. She is advised close followup with her primary care Dr. tomorrow.  Hilario Quarry, MD 07/16/11 [phone]

## 2011-07-16 NOTE — Telephone Encounter (Signed)
Ok to reschedule when she can. Hope she is doing better.

## 2011-07-17 ENCOUNTER — Telehealth: Payer: Self-pay | Admitting: *Deleted

## 2011-07-17 NOTE — Telephone Encounter (Signed)
Message copied by Romualdo Bolk on Thu Jul 17, 2011  8:45 AM ------      Message from: [facility], [place] [facility]      Created: Wed Jul 16, 2011  5:48 PM       Call  Thursday  to see how she is doing

## 2011-07-17 NOTE — ED Provider Notes (Signed)
See my ed note  Hilario Quarry, MD 07/17/11 [phone]

## 2011-07-17 NOTE — Telephone Encounter (Signed)
Noted.  Rest and fluids.

## 2011-07-17 NOTE — Telephone Encounter (Signed)
Spoke with pt and she is still having diarrhea and abd pain. Taking imodium and trying to keep in fluids.

## 2011-07-28 ENCOUNTER — Ambulatory Visit (INDEPENDENT_AMBULATORY_CARE_PROVIDER_SITE_OTHER): Payer: 59 | Admitting: Internal Medicine

## 2011-07-28 ENCOUNTER — Encounter: Payer: Self-pay | Admitting: Internal Medicine

## 2011-07-28 VITALS — BP 150/80 | HR 90 | Wt 207.0 lb

## 2011-07-28 DIAGNOSIS — R42 Dizziness and giddiness: Secondary | ICD-10-CM

## 2011-07-28 DIAGNOSIS — G4733 Obstructive sleep apnea (adult) (pediatric): Secondary | ICD-10-CM

## 2011-07-28 DIAGNOSIS — R945 Abnormal results of liver function studies: Secondary | ICD-10-CM

## 2011-07-28 DIAGNOSIS — K7689 Other specified diseases of liver: Secondary | ICD-10-CM

## 2011-07-28 DIAGNOSIS — I471 Supraventricular tachycardia, unspecified: Secondary | ICD-10-CM

## 2011-07-28 DIAGNOSIS — J309 Allergic rhinitis, unspecified: Secondary | ICD-10-CM

## 2011-07-28 DIAGNOSIS — R0981 Nasal congestion: Secondary | ICD-10-CM

## 2011-07-28 DIAGNOSIS — E119 Type 2 diabetes mellitus without complications: Secondary | ICD-10-CM

## 2011-07-28 DIAGNOSIS — I10 Essential (primary) hypertension: Secondary | ICD-10-CM

## 2011-07-28 DIAGNOSIS — E039 Hypothyroidism, unspecified: Secondary | ICD-10-CM

## 2011-07-28 DIAGNOSIS — E785 Hyperlipidemia, unspecified: Secondary | ICD-10-CM

## 2011-07-28 DIAGNOSIS — J3489 Other specified disorders of nose and nasal sinuses: Secondary | ICD-10-CM

## 2011-07-28 DIAGNOSIS — I498 Other specified cardiac arrhythmias: Secondary | ICD-10-CM

## 2011-07-28 MED ORDER — FLUTICASONE PROPIONATE 50 MCG/ACT NA SUSP: 2.0000 | Freq: Every day | NASAL | Status: DC

## 2011-07-28 NOTE — Progress Notes (Signed)
Subjective:    Patient ID: [name], female    DOB: 08-16-1955, 56 y.o.   MRN: [id number]  HPI  Patient comes in for an SDA appointment  She's had the onset of upper respiratory symptoms that are getting worse. Eyes running water yesterday.Very congested and sneezing fits.   And now she feels dizzy and lightheaded. There is no syncope or tachycardia more like vertigo and spinning. No hx of allergies .stopped and blocked  Ears. No self rx. Unsure what to take  Bp    Had hr 157.   Has been in 140  /80     at home before this DM has been stable no low LIPID  still hasn't started the low-dose Lipitor. Her liver tests have been abnormal she did reschedule her checkup.   Review of Systems No fever sob some cough no wheeze  No NVD currently  Just got over norovirus like illness. See emergency room visit requiring help with nausea vomiting and diarrhea and fluid resuscitation.  Past history family history social history reviewed in the electronic medical record.  Outpatient Prescriptions Prior to Visit  Medication Sig Dispense Refill  . ALPRAZolam (XANAX) 0.25 MG tablet Take 1 tablet (0.25 mg total) by mouth at bedtime as needed.  30 tablet  0  . amLODipine (NORVASC) 5 MG tablet Take 5 mg by mouth daily.      [name] aspirin 81 MG tablet Take 81 mg by mouth daily.        [name] buPROPion (WELLBUTRIN SR) 150 MG 12 hr tablet Take 150 mg by mouth daily.      [name] escitalopram (LEXAPRO) 10 MG tablet Take 10 mg by mouth daily.        . furosemide (LASIX) 20 MG tablet Take 20 mg by mouth daily.      [name] levothyroxine (SYNTHROID, LEVOTHROID) 125 MCG tablet Take 125 mcg by mouth daily.      [name] omeprazole (PRILOSEC) 20 MG capsule Take 1 capsule (20 mg total) by mouth 2 (two) times daily.  180 capsule  0  . saxagliptin HCl (ONGLYZA) 5 MG TABS tablet Take 5 mg by mouth daily.      [name] telmisartan (MICARDIS) 40 MG tablet Take 40 mg by mouth daily.            Objective:   Physical Exam wdwn in and very  congested    WDWN in NAD  quiet respirations; very nasally  congested  somewhat hoarse. Non toxic . HEENT: Normocephalic ;atraumatic , Eyes;  PERRL, EOMs  Full, lids and conjunctiva clear,,Ears: no deformities, canals nl, TM landmarks normal,  Old scar left tm Nose: no deformity or discharge but very congested;face minimally tender Mouth : OP clear without lesion or edema . Neck: Supple without adenopathy or masses or bruits Chest:  Clear to A&P without wheezes rales or rhonchi CV:  S1-S2 no gallops or murmurs peripheral perfusion is normal Skin :nl perfusion and no acute rashes  Neuro non focal   End gaze nystagmus 3 beats lateral.  Gait  Ok.       Assessment & Plan:  Acute resp congestion with sneezing itching and watery eyes. Dizziness  Vertigo type    Note for work.  fam hx of allerrgy but none for her.  Could be viral vs allergic  But because of dm and ht  And hx of svt avoiding decongestatnt and steroid for now.   Check into referral for cardiology  Hx of SVT  Reviewed labs from cpx    Good except lipids and lfts .  Never took lipitor but to do this after better  And then recheck lipids lfts and then CPX plan.      Lab Results  Component Value Date   WBC 9.9 07/16/2011   HGB 14.6 07/16/2011   HCT 42.8 07/16/2011   PLT 177 07/16/2011   GLUCOSE 135* 07/16/2011   CHOL 222* 07/11/2011   TRIG 80 07/11/2011   HDL 43 07/11/2011   LDLDIRECT 160.2 09/23/2010   LDLCALC 163* 07/11/2011   ALT 56* 07/16/2011   AST 55* 07/16/2011   NA 135 07/16/2011   K 3.7 07/16/2011   CL 100 07/16/2011   CREATININE 0.50 07/16/2011   BUN 6 07/16/2011   CO2 19 07/16/2011   TSH 3.108 07/11/2011   INR 1.1 ratio* 01/26/2009   HGBA1C 6.3* 07/11/2011   MICROALBUR 5.8* 09/23/2010

## 2011-07-28 NOTE — Patient Instructions (Signed)
You either have an acute allergy attack vs a viral respiratory infection that is causing  Dizziness.  For now will monitor your blood pressure.   And adjust if needed.  Take antihistamine  Every day  either claritin or allegra or zyrtec .  Afrin nose spray esp right side 1-2 x per day for  no more than 3 days .   Also add flonase nasal spray each day in both sides of nose.  This may help allergy nose   symptoms .  Will check into  Cardiology referral.   Warm compresses also to face to see if helps.  After you are better we should try the low dose lipitor and follow up.  We should reschedule the CPX  .

## 2011-08-29 ENCOUNTER — Encounter: Payer: Self-pay | Admitting: Cardiovascular Disease

## 2011-08-29 ENCOUNTER — Ambulatory Visit (INDEPENDENT_AMBULATORY_CARE_PROVIDER_SITE_OTHER): Payer: 59 | Admitting: Cardiovascular Disease

## 2011-08-29 VITALS — BP 137/75 | HR 89 | Resp 18 | Ht 67.0 in | Wt 212.4 lb

## 2011-08-29 DIAGNOSIS — R0602 Shortness of breath: Secondary | ICD-10-CM

## 2011-08-29 DIAGNOSIS — I10 Essential (primary) hypertension: Secondary | ICD-10-CM

## 2011-08-29 NOTE — Assessment & Plan Note (Signed)
[name] presents today for evaluation of an episode of tachycardia that occurred when she was in the emergency room. This was actually sinus tachycardia and was due to her viral illness. She's been sick with nausea, vomiting, and diarrhea for the past several days.  I don't have any further recommendations regarding her sinus tachycardia in the setting of a viral illness.  It is clear that she eats way to resolve. She eats salty meats such as bacon sausage on regular basis. She also eats [employer] soup and add salt to that. She salts her salad.  Long discussion with her regarding reduction of her salt intake. She has several risk factors for congestive heart failure and also has diabetes. We'll get an echocardiogram for further evaluation. I'll see her in 3 months for followup visit.

## 2011-08-29 NOTE — Patient Instructions (Signed)
Your physician has requested that you have an echocardiogram. Echocardiography is a painless test that uses sound waves to create images of your heart. It provides your doctor with information about the size and shape of your heart and how well your heart's chambers and valves are working. This procedure takes approximately one hour. There are no restrictions for this procedure.  Your physician recommends that you schedule a follow-up appointment in: 3 months with Dr Nahser.       

## 2011-08-29 NOTE — Progress Notes (Signed)
[name] Date of Birth  1955-11-22 [facility]     [place] Office  [address]    [address]   [address] [place], [place]  [address]    [place], [place]  [address] [phone]  Fax  [phone]  [phone]  Fax [phone]  Problem list: 1. Hypertension-she eats a lot of salt in line  2. Chronic leg edema 3. Left lower leg DVT-status post Coumadin therapy x6 months 4. Hypothyroidism 5. Diabetes mellitus  History of Present Illness:  [name] is a 56 year old female who presents for further evaluation of an episode of tachycardia. She had a viral illness which included nausea, vomiting, diarrhea. She was feeling very poorly. She works in the emergency room. She was found to have sinus tachycardia 155 beats a minute. She was treated with bed rest and fluids. She felt better. She has not had any further episodes of tachycardia.  She has occasional episodes of palpitations that clinically sound like premature ventricular contractions.  She takes Lasix on a daily basis because of leg edema.  The leg edema is worse in her left leg due to the DVT.  Current Outpatient Prescriptions on File Prior to Visit  Medication Sig Dispense Refill  . ALPRAZolam (XANAX) 0.25 MG tablet Take 1 tablet (0.25 mg total) by mouth at bedtime as needed.  30 tablet  0  . amLODipine (NORVASC) 5 MG tablet Take 5 mg by mouth daily.      [name] aspirin 81 MG tablet Take 81 mg by mouth daily.        [name] buPROPion (WELLBUTRIN SR) 150 MG 12 hr tablet Take 150 mg by mouth daily.      [name] escitalopram (LEXAPRO) 10 MG tablet Take 10 mg by mouth daily.        . fluticasone (FLONASE) 50 MCG/ACT nasal spray Place 2 sprays into the nose daily.  16 g  3  . furosemide (LASIX) 20 MG tablet Take 20 mg by mouth daily.      [name] levothyroxine (SYNTHROID, LEVOTHROID) 125 MCG tablet Take 125 mcg by mouth daily.      [name] omeprazole (PRILOSEC) 20 MG capsule Take 1 capsule (20 mg total) by mouth 2 (two) times daily.   180 capsule  0  . saxagliptin HCl (ONGLYZA) 5 MG TABS tablet Take 5 mg by mouth daily.      . promethazine (PHENERGAN) 50 MG tablet Take 0.5 tablets (25 mg total) by mouth every 6 (six) hours as needed for nausea.  30 tablet  1  . telmisartan (MICARDIS) 40 MG tablet Take 40 mg by mouth daily.        Allergies  Allergen Reactions  . Olmesartan Medoxomil     REACTION: ? if cough  . Tetracycline Hcl     REACTION: unspecified  . Venlafaxine     REACTION: severe dry moouth    Past Medical History  Diagnosis Date  . Depression   . DVT (deep venous thrombosis)     hx of on HRT left leg  . GERD (gastroesophageal reflux disease)   . Hyperlipidemia   . Hypertension   . Hypothyroidism   . PPD positive, treated     rx inh   . OSA on CPAP   . Diabetes mellitus   . 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 Breast biopsy   . Tubal ligation   .  Cholecystectomy   . Foot surgery   . Eye surgery   . Cataract extraction   . Percutaneous liver biopsy     History  Smoking status  . Never Smoker   Smokeless tobacco  . Not on file    History  Alcohol Use No    Family History  Problem Relation Age of Onset  . Diabetes Mother   . Hypertension Mother   . Arthritis Mother   . Heart disease Mother   . Heart failure Mother   . Stroke Father   . Heart disease Father   . Diabetes Paternal Grandmother   . Colon cancer Paternal Grandfather     Reviw of Systems:  Reviewed in the HPI.  All other systems are negative.  Physical Exam: Blood pressure 137/75, pulse 89, resp. rate 18, height 5\' 7"  (1.702 m), weight 212 lb 6.4 oz (96.344 kg). General: Well developed, well nourished, in no acute distress.  Head: Normocephalic, atraumatic, sclera non-icteric, mucus membranes are moist,   Neck: Supple. Carotids are 2 + without bruits. No JVD  Lungs: Clear bilaterally to auscultation.  Heart: regular rate.  normal  S1 S2. No  murmurs, gallops or rubs.  Abdomen: Soft, non-tender, non-distended with normal bowel sounds. No hepatomegaly. No rebound/guarding. No masses.  Msk:  Strength and tone are normal  Extremities: No clubbing or cyanosis. Trace leg edema especially in her left leg.  Neuro: Alert and oriented X 3. Moves all extremities spontaneously.  Psych:  Responds to questions appropriately with a normal affect.  ECG: 08/29/2011-Normal sinus rhythm at 90 beats a minute. Normal EKG.  Assessment / Plan:

## 2011-09-03 ENCOUNTER — Encounter: Payer: Self-pay | Admitting: Internal Medicine

## 2011-09-09 ENCOUNTER — Ambulatory Visit (HOSPITAL_COMMUNITY): Payer: 59 | Attending: Cardiology

## 2011-09-09 ENCOUNTER — Other Ambulatory Visit: Payer: Self-pay

## 2011-09-09 DIAGNOSIS — E785 Hyperlipidemia, unspecified: Secondary | ICD-10-CM | POA: Insufficient documentation

## 2011-09-09 DIAGNOSIS — R5383 Other fatigue: Secondary | ICD-10-CM | POA: Insufficient documentation

## 2011-09-09 DIAGNOSIS — G4733 Obstructive sleep apnea (adult) (pediatric): Secondary | ICD-10-CM | POA: Insufficient documentation

## 2011-09-09 DIAGNOSIS — I10 Essential (primary) hypertension: Secondary | ICD-10-CM | POA: Insufficient documentation

## 2011-09-09 DIAGNOSIS — I517 Cardiomegaly: Secondary | ICD-10-CM | POA: Insufficient documentation

## 2011-09-09 DIAGNOSIS — E119 Type 2 diabetes mellitus without complications: Secondary | ICD-10-CM | POA: Insufficient documentation

## 2011-09-09 DIAGNOSIS — R5381 Other malaise: Secondary | ICD-10-CM | POA: Insufficient documentation

## 2011-09-09 DIAGNOSIS — K7689 Other specified diseases of liver: Secondary | ICD-10-CM | POA: Insufficient documentation

## 2011-09-09 DIAGNOSIS — I82409 Acute embolism and thrombosis of unspecified deep veins of unspecified lower extremity: Secondary | ICD-10-CM | POA: Insufficient documentation

## 2011-09-09 DIAGNOSIS — R0602 Shortness of breath: Secondary | ICD-10-CM

## 2011-09-09 DIAGNOSIS — R609 Edema, unspecified: Secondary | ICD-10-CM | POA: Insufficient documentation

## 2011-09-09 DIAGNOSIS — R42 Dizziness and giddiness: Secondary | ICD-10-CM | POA: Insufficient documentation

## 2011-09-09 DIAGNOSIS — R0609 Other forms of dyspnea: Secondary | ICD-10-CM | POA: Insufficient documentation

## 2011-09-09 DIAGNOSIS — R0989 Other specified symptoms and signs involving the circulatory and respiratory systems: Secondary | ICD-10-CM | POA: Insufficient documentation

## 2011-09-11 ENCOUNTER — Telehealth: Payer: Self-pay | Admitting: Cardiovascular Disease

## 2011-09-11 NOTE — Telephone Encounter (Signed)
Done/echo

## 2011-09-11 NOTE — Telephone Encounter (Signed)
New Problem:     Patient called back regarding a test she had run.  Please call back.

## 2011-10-07 ENCOUNTER — Other Ambulatory Visit: Payer: 59

## 2011-10-14 ENCOUNTER — Encounter: Payer: Self-pay | Admitting: Internal Medicine

## 2011-10-14 ENCOUNTER — Ambulatory Visit (INDEPENDENT_AMBULATORY_CARE_PROVIDER_SITE_OTHER): Payer: 59 | Admitting: Internal Medicine

## 2011-10-14 VITALS — BP 140/72 | HR 80 | Temp 98.2°F | Resp 16 | Ht 65.0 in | Wt 218.0 lb

## 2011-10-14 DIAGNOSIS — I10 Essential (primary) hypertension: Secondary | ICD-10-CM

## 2011-10-14 DIAGNOSIS — E119 Type 2 diabetes mellitus without complications: Secondary | ICD-10-CM

## 2011-10-14 DIAGNOSIS — K7689 Other specified diseases of liver: Secondary | ICD-10-CM

## 2011-10-14 DIAGNOSIS — R5381 Other malaise: Secondary | ICD-10-CM

## 2011-10-14 DIAGNOSIS — R609 Edema, unspecified: Secondary | ICD-10-CM

## 2011-10-14 DIAGNOSIS — R945 Abnormal results of liver function studies: Secondary | ICD-10-CM

## 2011-10-14 DIAGNOSIS — G4733 Obstructive sleep apnea (adult) (pediatric): Secondary | ICD-10-CM

## 2011-10-14 DIAGNOSIS — E039 Hypothyroidism, unspecified: Secondary | ICD-10-CM

## 2011-10-14 DIAGNOSIS — F438 Other reactions to severe stress: Secondary | ICD-10-CM

## 2011-10-14 DIAGNOSIS — E785 Hyperlipidemia, unspecified: Secondary | ICD-10-CM

## 2011-10-14 DIAGNOSIS — K219 Gastro-esophageal reflux disease without esophagitis: Secondary | ICD-10-CM

## 2011-10-14 DIAGNOSIS — R5383 Other fatigue: Secondary | ICD-10-CM

## 2011-10-14 MED ORDER — ALPRAZOLAM 0.25 MG PO TABS: 0.2500 mg | ORAL_TABLET | Freq: Every evening | ORAL | Status: DC | PRN

## 2011-10-14 NOTE — Patient Instructions (Addendum)
Will notify you  of labs when available. Then plan  Follow up.  Try 1/2 of the micardis   Per day and see bp reading. Fax results of bp readings after a month.

## 2011-10-14 NOTE — Progress Notes (Signed)
  Subjective:    Patient ID: [name], female    DOB: December 02, 1955, 57 y.o.   MRN: [id number]  HPI  Patient comes in today for follow up of  multiple medical problems.  Sees diabetes person q 3 months    Sugars ok on onglyza  BP diastolic  Low at night but syst up to 140?   Stopped micardis on her own for a few months.taking lasix for "leg swelling and tightness" Eating more stress.  osa on cpap  gerd taking med bid.   Review of Systems No new cp  Sob  Bleeding syncope  Wakening at night since family disruption.    Past history family history social history reviewed in the electronic medical record.     Objective:   Physical Exam BP 140/72  Pulse 80  Temp 98.2 F (36.8 C)  Resp 16  Ht 5\' 5"  (1.651 m)  Wt 218 lb (98.884 kg)  BMI 36.28 kg/m2 Wt Readings from Last 3 Encounters:  10/14/11 218 lb (98.884 kg)  08/29/11 212 lb 6.4 oz (96.344 kg)  07/28/11 207 lb (93.895 kg)  wdwn in nad heent grossly nl tongue midline Neck: Supple without adenopathy or masses or bruits Chest:  Clear to A&P without wheezes rales or rhonchi CV:  S1-S2 no gallops or murmurs peripheral perfusion is normal Abdomen:  Sof,t normal bowel sounds protuberant   Hepar ? 2 fb below cm  Left u q dull to percussion  Unsure of spleen size , no guarding rebound or masses no CVA tenderness no ascites No clubbing cyanosis   trc to 1+  Edema no warmth .  Feet intact nl pulses and sensation     Assessment & Plan:  Dm apparently followed    Per clinic Had been in good control but weigh again from family stress recently no neuropathy or renal disease or neuropathy. Due for lab SA under rx   wakening ? Anxiety ok to she was unable try xanax prn temporarily.  Risk benefit of medication discussed. Lipids  On low dose atorva  10 mg   Not on med list?  HT  Has stopped the micardis cause of concern about low diastolic in the evening in the 60? Range but no sx.   Disc trying to add 1/2 of pill per day.  Fatty liver  with abnormal lfts and splenomegaly  Due for follow up  ? Plan. Diuretic use  Taking  for swelling and her cards advise d to stop but feels she needs it for achy legs.  GERD taking bid med  Obesity worse recenlty  Counseled.  Thyroid utd on monitoring.  Mood recently prob external factors .use  Xanax as needed  Considering mammogram    Total visit 30 mins > 50% spent counseling and coordinating care  Going over med list.

## 2011-10-15 LAB — BASIC METABOLIC PANEL
CO2: 24 mEq/L (ref 19–32)
Calcium: 8.7 mg/dL (ref 8.4–10.5)
Creatinine, Ser: 0.5 mg/dL (ref 0.4–1.2)
Glucose, Bld: 123 mg/dL — ABNORMAL HIGH (ref 70–99)
Sodium: 140 mEq/L (ref 135–145)

## 2011-10-15 LAB — LIPID PANEL
Cholesterol: 168 mg/dL (ref 0–200)
HDL: 48.7 mg/dL (ref >39.00)
VLDL: 18.6 mg/dL (ref 0.0–40.0)

## 2011-10-15 LAB — HEPATIC FUNCTION PANEL: Albumin: 3.8 g/dL (ref 3.5–5.2)

## 2011-10-17 ENCOUNTER — Telehealth: Payer: Self-pay | Admitting: Internal Medicine

## 2011-10-17 NOTE — Progress Notes (Signed)
Quick Note:  Called and spoke with pt about lab results. Pt requested labs be faxed at work [phone]). Labs sent. ______

## 2011-10-17 NOTE — Telephone Encounter (Signed)
Attempted to call pt at work number (continous ring).  Will attempt to call again.

## 2011-10-17 NOTE — Telephone Encounter (Signed)
Patient called stating she would like a call back with lab results. Can be reached at [phone] until 3:20 and can be reach by mobile thereafter. Please assist.

## 2011-10-17 NOTE — Telephone Encounter (Signed)
Pt aware of lab results 

## 2011-10-17 NOTE — Progress Notes (Signed)
Quick Note:  Attempted to call pt at work number as requested. Will attempt to call again. ______

## 2011-11-04 ENCOUNTER — Other Ambulatory Visit: Payer: Self-pay | Admitting: Family Medicine

## 2011-11-04 ENCOUNTER — Other Ambulatory Visit: Payer: Self-pay | Admitting: Internal Medicine

## 2011-11-17 ENCOUNTER — Ambulatory Visit: Payer: 59 | Admitting: Cardiovascular Disease

## 2011-11-18 ENCOUNTER — Other Ambulatory Visit: Payer: Self-pay | Admitting: Family Medicine

## 2011-11-18 MED ORDER — OMEPRAZOLE 20 MG PO CPDR: 20.0000 mg | DELAYED_RELEASE_CAPSULE | Freq: Two times a day (BID) | ORAL | Status: DC

## 2011-12-10 ENCOUNTER — Other Ambulatory Visit: Payer: Self-pay | Admitting: Family Medicine

## 2011-12-10 ENCOUNTER — Telehealth: Payer: Self-pay | Admitting: Family Medicine

## 2011-12-10 ENCOUNTER — Ambulatory Visit (HOSPITAL_COMMUNITY)
Admission: RE | Admit: 2011-12-10 | Discharge: 2011-12-10 | Disposition: A | Discharge status: Home / Self Care | Payer: 59 | Source: Ambulatory Visit | Attending: Internal Medicine | Admitting: Internal Medicine

## 2011-12-10 ENCOUNTER — Ambulatory Visit (INDEPENDENT_AMBULATORY_CARE_PROVIDER_SITE_OTHER): Payer: 59 | Admitting: Internal Medicine

## 2011-12-10 ENCOUNTER — Encounter: Payer: Self-pay | Admitting: Internal Medicine

## 2011-12-10 VITALS — BP 160/80 | HR 80 | Temp 98.4°F | Wt 212.0 lb

## 2011-12-10 DIAGNOSIS — K7689 Other specified diseases of liver: Secondary | ICD-10-CM

## 2011-12-10 DIAGNOSIS — M79605 Pain in left leg: Secondary | ICD-10-CM

## 2011-12-10 DIAGNOSIS — Z86718 Personal history of other venous thrombosis and embolism: Secondary | ICD-10-CM | POA: Insufficient documentation

## 2011-12-10 DIAGNOSIS — R161 Splenomegaly, not elsewhere classified: Secondary | ICD-10-CM

## 2011-12-10 DIAGNOSIS — M79609 Pain in unspecified limb: Secondary | ICD-10-CM

## 2011-12-10 DIAGNOSIS — E119 Type 2 diabetes mellitus without complications: Secondary | ICD-10-CM

## 2011-12-10 DIAGNOSIS — R609 Edema, unspecified: Secondary | ICD-10-CM

## 2011-12-10 DIAGNOSIS — R945 Abnormal results of liver function studies: Secondary | ICD-10-CM

## 2011-12-10 DIAGNOSIS — I10 Essential (primary) hypertension: Secondary | ICD-10-CM

## 2011-12-10 DIAGNOSIS — R7989 Other specified abnormal findings of blood chemistry: Secondary | ICD-10-CM

## 2011-12-10 MED ORDER — CEPHALEXIN 500 MG PO CAPS: 500.0000 mg | ORAL_CAPSULE | Freq: Three times a day (TID) | ORAL | Status: AC

## 2011-12-10 MED ORDER — TELMISARTAN 40 MG PO TABS: 40.0000 mg | ORAL_TABLET | Freq: Every day | ORAL | Status: DC

## 2011-12-10 NOTE — Progress Notes (Signed)
VASCULAR LAB PRELIMINARY  PRELIMINARY  PRELIMINARY  PRELIMINARY  Left lower extremity venous duplex completed.    Preliminary report:  Left:  No evidence of DVT, superficial thrombosis, or Baker's cyst.  Cherylyn Sundby, RVT 12/10/2011, 1:53 PM

## 2011-12-10 NOTE — Telephone Encounter (Signed)
Can get venous doppler first  Left leg pain and hx of dvt and then work in  Put her in the  3 pm slot even though she wont get there  At that time . And will be a work in .

## 2011-12-10 NOTE — Progress Notes (Signed)
Subjective:    Patient ID: [name], female    DOB: Apr 15, 1956, 56 y.o.   MRN: [id number]  HPI Patient comes in today for SDA for  new problem evaluation.as an urgent  work in. Patient has always had leg pain in her left lower extremity ever since she had the DVT a number of years ago. However over the last 24-hour she is having increasing pain sensitivity to touch feeling warm and hot. She's worried about another DVT and there is more swelling. She denies any injury. 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 She apparently used some interim in the past with help.   She's been out of her blood pressure medicine for 2 weeks the mycardis  and just hasn't had a chance to call. Her blood sugars have been good in the 120 range. She had a right side sharp pain after coughing and wonders if it's important. No specific shortness of breath.  2 weeks ago about June 14 she had a severe nausea vomiting diarrheal illness that was profuse and she ended up with emergency room evaluation was felt to probably be a stomach bug since that time she is better but still has gas pains at times and irregular bowel symptoms.  She has no blood no fever no UTI symptoms  her diet is quite back to normal.  Review of Systems Negative for fever shortness of breath UTI bleeding bruising as per history of present illness Past history family history social history reviewed in the electronic medical record. Outpatient Encounter Prescriptions as of 12/10/2011  Medication Sig Dispense Refill  . ALPRAZolam (XANAX) 0.25 MG tablet Take 1 tablet (0.25 mg total) by mouth at bedtime as needed.  30 tablet  1  . amLODipine (NORVASC) 5 MG tablet Take 5 mg by mouth daily.      [name] aspirin 81 MG tablet Take 81 mg by mouth daily.        [name] atorvastatin (LIPITOR) 10 MG tablet Take 1 tablet (10 mg total) by mouth daily.  90 tablet  1  . buPROPion (WELLBUTRIN SR) 150  MG 12 hr tablet Take 150 mg by mouth daily.      [name] escitalopram (LEXAPRO) 10 MG tablet Take 10 mg by mouth daily.        . fluticasone (FLONASE) 50 MCG/ACT nasal spray Place 2 sprays into the nose daily.  16 g  3  . furosemide (LASIX) 20 MG tablet Take 20 mg by mouth daily.      . furosemide (LASIX) 20 MG tablet TAKE 1 TABLET BY MOUTH ONCE DAILY AS DIRECTED  90 tablet  PRN  . levothyroxine (SYNTHROID, LEVOTHROID) 125 MCG tablet Take 125 mcg by mouth daily.      [name] omeprazole (PRILOSEC) 20 MG capsule Take 1 capsule (20 mg total) by mouth 2 (two) times daily.  180 capsule  2  . promethazine (PHENERGAN) 50 MG tablet Take 25 mg by mouth every 6 (six) hours as needed.      . saxagliptin HCl (ONGLYZA) 5 MG TABS tablet Take 5 mg by mouth daily.      [name] telmisartan (MICARDIS) 40 MG tablet Take 1 tablet (40 mg total) by mouth daily.  30 tablet  11  . DISCONTD: telmisartan (MICARDIS) 40 MG tablet Take 0.5 tablets (20 mg total) by mouth daily.  30 tablet  1  .  Objective:   Physical Exam BP 160/80  Pulse 80  Temp 98.4 F (36.9 C) (Oral)  Wt 212 lb (96.163 kg) Well-developed well-nourished in no acute distress a bit anxious here with her daughter. Neck supple without masses chest CTA BS equal cardiac S1-S2 no gallops or murmurs no rubs. Chest wall no point tenderness but she points to the right lateral lower thorax and lateral abdominal muscles Abdomen soft protuberant no fluid wave liver is nontender. At right costal margin cannot tell where spleen tip is  Extremities pulses intact no ulcers there is edema around her left ankle and some chronic woody changes over the left lower extremity. There is slight erythema no ulcers weeping or streaks The area of tenderness is  medial above the ankle on the left no point bony tenderness but there is +2 edema of the ankle Right lower extremity shows some varicosities and some +1 edema but no deformity.  Doppler of lower extremity shows no DVT.      Assessment & Plan:   Left lower extremity pain and irregular edema with chronic changes. Status post DVT in that leg over 5 years ago. Concern about increasing pain recently consider early infection secondary to skin is a possibility. She has some mild erythema but no focal abscess or specific skin lesion  Prescription for antibiotic given Keflex 500 3 times a day she cannot take doxycycline if the pain doesn't subside with elevation or getting redder.  He is just recovering from a GI gastroenteritis and antibiotics may be somewhat of a risk however if this is progressive would add at this point. When things are calmed down she should get that prescription for her 20/30 thigh high compression stocking that would be helpful when she works. 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 We'll schedule this in late September early October when she is due for an A1c test.

## 2011-12-10 NOTE — Patient Instructions (Addendum)
We'll refill your medication for blood pressure restarted in make sure your readings are coming down to a normal range,  Because you're having increased warmth and pain I am concerned about early infection in your the swollen leg.  We can add antibiotic if this is continuing  I think he would benefit from a thigh high compression stocking will write prescription for 20/30 to be fitted. Decreasing the swelling in your legs will decreasing the pain. In discomfort.  Reviewed your chart as far as liver tests will eventually need an ultrasound and other lab work but not due for this today  Schedule abdominal ultrasound  to be done at your convenience in plan lab work in October to include liver function tests, hemoglobin A1c, alpha-fetoprotein, ammonia level , protime  and an ANA

## 2011-12-10 NOTE — Telephone Encounter (Signed)
Patient sched. at [facility] @ 1:30. Pt aware. Misty entering order.

## 2011-12-10 NOTE — Telephone Encounter (Signed)
Pt calling with L lower leg pain. Has hx ov DVT in that leg. Did not sleep last night. I encouraged her to come in this morning, in case she needs an ultrasound. Due to [facility]'s attendance policy, she cannot leave work today, and cannot come until AFTER 3:30pm. Please advise if I can work her in after that time, etc. Do you want her to get and ultrasound first, in case they are closed after she sees you?

## 2011-12-22 ENCOUNTER — Other Ambulatory Visit (HOSPITAL_COMMUNITY): Payer: 59

## 2011-12-26 ENCOUNTER — Other Ambulatory Visit: Payer: Self-pay | Admitting: Family Medicine

## 2011-12-26 ENCOUNTER — Other Ambulatory Visit: Payer: Self-pay | Admitting: Internal Medicine

## 2011-12-26 MED ORDER — SAXAGLIPTIN HCL 5 MG PO TABS: 5.0000 mg | ORAL_TABLET | Freq: Every day | ORAL | Status: DC

## 2011-12-26 MED ORDER — ESCITALOPRAM OXALATE 10 MG PO TABS: 10.0000 mg | ORAL_TABLET | Freq: Every day | ORAL | Status: DC

## 2011-12-26 NOTE — Telephone Encounter (Signed)
Okay for #30

## 2011-12-26 NOTE — Telephone Encounter (Signed)
Last seen 12/10/11(acute) and 10/14/11(fu).  Ok to give 30?  WP patient.

## 2011-12-26 NOTE — Telephone Encounter (Signed)
Sent by e-scribe. 

## 2012-01-21 ENCOUNTER — Ambulatory Visit: Payer: 59

## 2012-02-11 ENCOUNTER — Encounter: Payer: Self-pay | Admitting: Internal Medicine

## 2012-02-16 ENCOUNTER — Ambulatory Visit (INDEPENDENT_AMBULATORY_CARE_PROVIDER_SITE_OTHER): Payer: PRIVATE HEALTH INSURANCE | Admitting: Internal Medicine

## 2012-02-16 ENCOUNTER — Encounter: Payer: Self-pay | Admitting: Internal Medicine

## 2012-02-16 VITALS — BP 126/80 | HR 77 | Ht 65.0 in | Wt 219.8 lb

## 2012-02-16 DIAGNOSIS — G4733 Obstructive sleep apnea (adult) (pediatric): Secondary | ICD-10-CM

## 2012-02-16 NOTE — Progress Notes (Signed)
02/16/12- 37 yoF never smoker seeking to re-establish for OSA.  Her machine is old, doesn't sound right and but still work reliably. She has stayed with CPAP 13/ [facility] since NPSG 01/21/07 AHI 25.1, when body weight was 220 lbs. [facility] no longer checks with her. She has considered CPAP to be "like a miracle" and has used it every night. Once on the beach trip she left it at home and her husband says she snores badly. Bedtime between 9 and 10 PM, sleep latency 25 minutes, waking once or twice before up at 4:45 AM. She denies cardiopulmonary disease. Does have a history of allergic rhinitis, hypertension , DVT left leg, and some sort of liver disease requiring liver biopsy. No ENT surgery. She is married, working as a Geographical information systems officer at the [employer]. Mother died of heart failure father died with renal failure, no family history of sleep apnea.  Prior to Admission medications   Medication Sig Start Date End Date Taking? Authorizing Provider  ALPRAZolam (XANAX) 0.25 MG tablet Take 1 tablet (0.25 mg total) by mouth at bedtime as needed. 10/14/11  Yes Madelin Headings, MD  amLODipine (NORVASC) 5 MG tablet Take 5 mg by mouth daily.   Yes Historical Provider, MD  aspirin 81 MG tablet Take 81 mg by mouth daily.     Yes Historical Provider, MD  atorvastatin (LIPITOR) 10 MG tablet Take 1 tablet (10 mg total) by mouth daily. 10/14/11  Yes Madelin Headings, MD  buPROPion (WELLBUTRIN SR) 150 MG 12 hr tablet Take 150 mg by mouth daily.   Yes Historical Provider, MD  escitalopram (LEXAPRO) 10 MG tablet Take 1 tablet (10 mg total) by mouth daily. 12/26/11  Yes Nelwyn Salisbury, MD  fluticasone (FLONASE) 50 MCG/ACT nasal spray Place 2 sprays into the nose daily. 07/28/11 07/27/12 Yes Madelin Headings, MD  furosemide (LASIX) 20 MG tablet TAKE 1 TABLET BY MOUTH ONCE DAILY AS DIRECTED 11/04/11  Yes Madelin Headings, MD  levothyroxine (SYNTHROID, LEVOTHROID) 125 MCG tablet Take 125 mcg by mouth daily.   Yes  Historical Provider, MD  omeprazole (PRILOSEC) 20 MG capsule Take 1 capsule (20 mg total) by mouth 2 (two) times daily. 11/18/11  Yes Madelin Headings, MD  promethazine (PHENERGAN) 50 MG tablet Take 25 mg by mouth every 6 (six) hours as needed. 07/16/11  Yes Lorretta Harp, MD  saxagliptin HCl (ONGLYZA) 5 MG TABS tablet Take 1 tablet (5 mg total) by mouth daily. 12/26/11  Yes Madelin Headings, MD  telmisartan (MICARDIS) 40 MG tablet Take 1 tablet (40 mg total) by mouth daily. 12/10/11  Yes Madelin Headings, MD   Past Medical History  Diagnosis Date  . Depression   . DVT (deep venous thrombosis)     hx of on HRT left leg  . GERD (gastroesophageal reflux disease)   . Hyperlipidemia   . Hypertension   . Hypothyroidism   . PPD positive, treated     rx inh   . OSA on CPAP   . Diabetes mellitus   . 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 Breast biopsy   . Tubal ligation   . Cholecystectomy   . Foot surgery   . Eye surgery   . Cataract extraction   . Percutaneous liver biopsy    Family History  Problem Relation Age of Onset  . Diabetes Mother   . Hypertension  Mother   . Arthritis Mother   . Heart disease Mother   . Heart failure Mother   . Stroke Father   . Heart disease Father   . Diabetes Paternal Grandmother   . Colon cancer Paternal Grandfather    History   Social History  . Marital Status: Married    Spouse Name: N/A    Number of Children: N/A  . Years of Education: N/A   Occupational History  . Not on file.   Social History Main Topics  . Smoking status: Never Smoker   . Smokeless tobacco: Not on file  . Alcohol Use: No  . Drug Use: No  . Sexually Active:    Other Topics Concern  . 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    ROS-see  HPI Constitutional:   No-   weight loss, night sweats, fevers, chills, fatigue, lassitude. HEENT:   No-  headaches, difficulty swallowing, tooth/dental problems, sore throat,       No-  sneezing, itching, ear ache, nasal congestion, post nasal drip,  CV:  No-   chest pain, orthopnea, PND, +swelling in lower extremities,  No-anasarca,  dizziness, palpitations Resp: +  shortness of breath with exertion or at rest.              No-   productive cough,  + non-productive cough,  No- coughing up of blood.              No-   change in color of mucus.  No- wheezing.   Skin: No-   rash or lesions. GI:  +  heartburn, indigestion, No-abdominal pain, nausea, vomiting, diarrhea,                 change in bowel habits, loss of appetite GU: No-   dysuria, change in color of urine, no urgency or frequency.  No- flank pain. MS:  No-   joint pain or swelling.  No- decreased range of motion.  No- back pain. Neuro-     nothing unusual Psych:  No- change in mood or affect. + depression or anxiety.  No memory loss.  OBJ- Physical Exam General- Alert, Oriented, Affect-appropriate, Distress- none acute, overweight Skin- rash-none, lesions- none, excoriation- none Lymphadenopathy- none Head- atraumatic            Eyes- Gross vision intact, PERRLA, conjunctivae and secretions clear            Ears- Hearing, canals-normal            Nose- + crusting, no-Septal dev, mucus, polyps, erosion, perforation             Throat- Mallampati IV , mucosa clear , drainage- none, tonsils- atrophic Neck- flexible , trachea midline, no stridor , thyroid nl, carotid no bruit Chest - symmetrical excursion , unlabored           Heart/CV- RRR , no murmur , no gallop  , no rub, nl s1 s2                           - JVD- none , edema- none, stasis changes- none, varices- none           Lung- clear to P&A, wheeze- none, cough- none , dullness-none,  rub- none           Chest wall-  Abd- tender-no, distended-no, bowel sounds-present,  HSM- no Br/ Gen/ Rectal- Not done, not indicated Extrem- cyanosis- none, clubbing, none, atrophy- none, strength- nl Neuro- grossly intact to observation

## 2012-02-16 NOTE — Patient Instructions (Addendum)
Order- DME [facility]  Replacement CPAP 100- old, worn out machine- mask of choice, supplies, humidifier      Dx OSA

## 2012-02-16 NOTE — Assessment & Plan Note (Signed)
Compliance and control been very good. Husband reports she snores if not wearing CPAP. We discussed the difference between adequate machine and a change in her optimal pressure setting. Her machine is clearly now wearing out.  Plan-[facility] to replace old CPAP machine, continuing pressure at 13.

## 2012-02-22 ENCOUNTER — Encounter: Payer: Self-pay | Admitting: Internal Medicine

## 2012-02-23 ENCOUNTER — Telehealth: Payer: Self-pay | Admitting: Internal Medicine

## 2012-02-23 NOTE — Telephone Encounter (Signed)
Spoke with patient and she spoke with [facility] and will be set up tomorrow. Nothing else needed at this time. Rhonda J Cobb

## 2012-02-23 NOTE — Telephone Encounter (Signed)
Called [name] at [facility]. Order was received on 10/913 and [facility] has been leaving messages on home number. Left message on 02/19/12, 02/20/12 and 02/23/12. I advised [facility] to contact patient her work number so that maybe why patient hasn't received her set up message. I have asked Kayla with [facility] to contact patient either on her work number or mobile number today to arrange set up.  Tired to return patient's call and didn't get an answer. LMOAM for pt of the above. Also left phone number that patient may contact [facility] and speak with Kayla B at [phone] ext [phone]. Instructed patient to call me back if she had any questions. Rhonda J Cobb

## 2012-02-24 ENCOUNTER — Other Ambulatory Visit: Payer: Self-pay | Admitting: Family Medicine

## 2012-02-24 ENCOUNTER — Encounter: Payer: Self-pay | Admitting: Internal Medicine

## 2012-02-24 ENCOUNTER — Other Ambulatory Visit: Payer: Self-pay | Admitting: Internal Medicine

## 2012-02-24 MED ORDER — ESCITALOPRAM OXALATE 10 MG PO TABS: 10.0000 mg | ORAL_TABLET | Freq: Every day | ORAL | Status: DC

## 2012-03-02 ENCOUNTER — Ambulatory Visit: Payer: 59 | Admitting: Internal Medicine

## 2012-03-19 ENCOUNTER — Ambulatory Visit (HOSPITAL_COMMUNITY)
Admission: RE | Admit: 2012-03-19 | Discharge: 2012-03-19 | Disposition: A | Discharge status: Home / Self Care | Payer: 59 | Source: Ambulatory Visit | Attending: Internal Medicine | Admitting: Internal Medicine

## 2012-03-19 DIAGNOSIS — K7689 Other specified diseases of liver: Secondary | ICD-10-CM

## 2012-03-19 DIAGNOSIS — R609 Edema, unspecified: Secondary | ICD-10-CM

## 2012-03-19 DIAGNOSIS — E119 Type 2 diabetes mellitus without complications: Secondary | ICD-10-CM

## 2012-03-19 DIAGNOSIS — R161 Splenomegaly, not elsewhere classified: Secondary | ICD-10-CM | POA: Insufficient documentation

## 2012-03-19 DIAGNOSIS — R7989 Other specified abnormal findings of blood chemistry: Secondary | ICD-10-CM | POA: Insufficient documentation

## 2012-03-19 DIAGNOSIS — R945 Abnormal results of liver function studies: Secondary | ICD-10-CM

## 2012-03-19 DIAGNOSIS — Z9089 Acquired absence of other organs: Secondary | ICD-10-CM | POA: Insufficient documentation

## 2012-03-23 ENCOUNTER — Other Ambulatory Visit: Payer: Self-pay | Admitting: Internal Medicine

## 2012-03-25 ENCOUNTER — Telehealth: Payer: Self-pay | Admitting: Internal Medicine

## 2012-03-25 NOTE — Telephone Encounter (Signed)
Patient would like her most recent lab results and would also like to know if she should make a follow up appointment.  Please call

## 2012-03-25 NOTE — Telephone Encounter (Signed)
Left message on # listed below.  Pt should have repeat labs and fu visit with [facility] in Dec.

## 2012-03-26 NOTE — Telephone Encounter (Signed)
Pt notified and has set up appointments.

## 2012-03-29 ENCOUNTER — Telehealth: Payer: Self-pay | Admitting: Family Medicine

## 2012-03-29 NOTE — Telephone Encounter (Signed)
I spoke to the pt to give her the results of her ultrasound.  She had been to the pharmacy to get a 90 day supply of her Synthroid.  It is not affordable.  A 3 month supply will cost her $69.00.  She would like to be switched to generic.  She will be at work on 03/30/12 from 7 to 3.  Please advise and I will call the pt when she gets off work.  Thanks!!!

## 2012-03-30 NOTE — Telephone Encounter (Signed)
Ok to change to generic 90 days x 1.  Will need tsh in 2- 3 months after change

## 2012-03-31 ENCOUNTER — Other Ambulatory Visit: Payer: Self-pay | Admitting: Internal Medicine

## 2012-03-31 MED ORDER — LEVOTHYROXINE SODIUM 125 MCG PO TABS: 125.0000 ug | ORAL_TABLET | Freq: Every day | ORAL | Status: DC

## 2012-03-31 NOTE — Telephone Encounter (Signed)
Pt notified by telephone.  She is coming in on 05/11/12 for an appt with [facility].  She will make lab appt at that time.  She also notified me that she has a flu vaccine last Friday.  I will note this in her chart.

## 2012-04-02 ENCOUNTER — Telehealth: Payer: Self-pay | Admitting: Family Medicine

## 2012-04-02 NOTE — Telephone Encounter (Signed)
This patient is requesting to switch from brand name Synthroid to generic levothyroxine .  Please advise.  She would like a 90 day supply sent to [facility].

## 2012-04-02 NOTE — Telephone Encounter (Signed)
This has already been answered in another note.

## 2012-04-02 NOTE — Telephone Encounter (Signed)
Please check last  Phone message and tell pt plan I t hibk I already answered this,

## 2012-04-05 ENCOUNTER — Ambulatory Visit: Payer: 59 | Admitting: Internal Medicine

## 2012-04-12 ENCOUNTER — Other Ambulatory Visit: Payer: Self-pay | Admitting: Family Medicine

## 2012-04-12 MED ORDER — GLUCOSE BLOOD VI STRP: ORAL_STRIP | Status: DC

## 2012-04-21 ENCOUNTER — Ambulatory Visit (INDEPENDENT_AMBULATORY_CARE_PROVIDER_SITE_OTHER): Payer: 59 | Admitting: Family Medicine

## 2012-04-21 DIAGNOSIS — E119 Type 2 diabetes mellitus without complications: Secondary | ICD-10-CM

## 2012-04-21 NOTE — Progress Notes (Signed)
Patient presents today for 3 month diabetes follow-up as part of the employer-sponsored Link to  Wellness program.  Medications, glucose readings, and lifestyle interventions (diet and exercise) have been reviewed.  Details of this visit may be found in [employer] documenting program through [employer] [facility]).  Patient has set a series of goals and will follow-up in 3 months for further review of diabetes.

## 2012-04-26 ENCOUNTER — Encounter: Payer: Self-pay | Admitting: Internal Medicine

## 2012-04-28 ENCOUNTER — Telehealth: Payer: Self-pay | Admitting: Internal Medicine

## 2012-04-28 ENCOUNTER — Other Ambulatory Visit: Payer: Self-pay | Admitting: Internal Medicine

## 2012-04-28 NOTE — Telephone Encounter (Signed)
Advised pt Dr. Fabian Sharp is out of the office this week and offered patient an appt with another provider since she was extremely anxious and concerned.  Pt declined to see another provider and stated she preferred to see Dr. Fabian Sharp.  I explained to the patient that I would need to check with Dr. Fabian Sharp to see if it would be possible for her to be worked in on 12/23 and call her back. Pt voiced her understanding.

## 2012-04-28 NOTE — Telephone Encounter (Signed)
This is not emergent.  Please do not use SDA slot for this.  Put in with [facility] in appropriate slot.  Thanks!!!

## 2012-04-28 NOTE — Telephone Encounter (Signed)
Pt would like to know if Dr Fabian Sharp could see her Mon. Dec 23? She has a lab appt that day also. Pt has found a lump in her breast and would like Dr Fabian Sharp to take a look at it. There is only same day appt left- an 11:00 is first thing.

## 2012-04-29 ENCOUNTER — Encounter: Payer: Self-pay | Admitting: Family Medicine

## 2012-04-29 NOTE — Progress Notes (Signed)
Patient ID: [name], female   DOB: [birth year], 56 y.o.   MRN: [id number] Reviewed:  Agree with documentation and management.

## 2012-05-02 NOTE — Telephone Encounter (Signed)
I have been out or the office . We can work her in at end of day tomorrow or Tuesday if she wishes .

## 2012-05-03 ENCOUNTER — Ambulatory Visit (INDEPENDENT_AMBULATORY_CARE_PROVIDER_SITE_OTHER): Payer: 59 | Admitting: Family

## 2012-05-03 ENCOUNTER — Other Ambulatory Visit: Payer: 59

## 2012-05-03 ENCOUNTER — Encounter: Payer: Self-pay | Admitting: Family

## 2012-05-03 VITALS — BP 140/70 | HR 89 | Temp 98.5°F | Resp 16 | Ht 66.0 in | Wt 215.0 lb

## 2012-05-03 DIAGNOSIS — N63 Unspecified lump in unspecified breast: Secondary | ICD-10-CM

## 2012-05-03 DIAGNOSIS — E039 Hypothyroidism, unspecified: Secondary | ICD-10-CM

## 2012-05-03 DIAGNOSIS — N6310 Unspecified lump in the right breast, unspecified quadrant: Secondary | ICD-10-CM

## 2012-05-03 DIAGNOSIS — E119 Type 2 diabetes mellitus without complications: Secondary | ICD-10-CM

## 2012-05-03 DIAGNOSIS — R945 Abnormal results of liver function studies: Secondary | ICD-10-CM

## 2012-05-03 DIAGNOSIS — R7989 Other specified abnormal findings of blood chemistry: Secondary | ICD-10-CM

## 2012-05-03 LAB — BASIC METABOLIC PANEL
Chloride: 102 mEq/L (ref 96–112)
Creat: 0.62 mg/dL (ref 0.50–1.10)
Potassium: 3.8 mEq/L (ref 3.5–5.3)

## 2012-05-03 LAB — IRON AND TIBC
%SAT: 13 % — ABNORMAL LOW (ref 20–55)
TIBC: 475 ug/dL — ABNORMAL HIGH (ref 250–470)
UIBC: 414 ug/dL — ABNORMAL HIGH (ref 125–400)

## 2012-05-03 LAB — HEPATIC FUNCTION PANEL
ALT: 40 U/L — ABNORMAL HIGH (ref 0–35)
Albumin: 4 g/dL (ref 3.5–5.2)
Total Protein: 7.5 g/dL (ref 6.0–8.3)

## 2012-05-03 NOTE — Patient Instructions (Addendum)
Please complete blood work prior to leaving.  You will be contacted about your mammogram- please let us know if you have not heard back about your Mammogram appointment by Friday.  Follow up in 1 month, sooner if problems/concerns.

## 2012-05-03 NOTE — Progress Notes (Signed)
Subjective:    Patient ID: [name], female    DOB: 04-Oct-1955, 56 y.o.   MRN: [id number]  HPI  [name] is a 56 yr old female who presents today to establish care.   1) R breast mass- Noticed on Thursday or Friday. Last mammogram was in 2008.    2) DM2- last A1C was 6.6 six months ago. She is maintained on onglyza.    3) Elevated LFT's-  6 months ago her LFT's were noted to be elevated.  This is not new for her.  Review of her record shows + hx of fatty liver noted on [name] back in 2010.  Review of Systems See HPI  Past Medical History  Diagnosis Date  . Depression   . DVT (deep venous thrombosis)     hx of on HRT left leg  . GERD (gastroesophageal reflux disease)   . Hyperlipidemia   . Hypertension   . Hypothyroidism   . PPD positive, treated     rx inh   . OSA on CPAP   . Diabetes mellitus   . Liver disease, chronic, with cirrhosis     ? autoimmune    History   Social History  . Marital Status: Married    Spouse Name: N/A    Number of Children: N/A  . Years of Education: N/A   Occupational History  . Not on file.   Social History Main Topics  . Smoking status: Never Smoker   . Smokeless tobacco: Never Used  . Alcohol Use: No  . Drug Use: No  . Sexually Active: Not on file   Other Topics Concern  . 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     Past Surgical History  Procedure Date  . Tubal ligation   . Cholecystectomy   . Foot surgery   . Eye surgery   . Cataract extraction   . Percutaneous liver biopsy   . Breast biopsy     left breast    Family History  Problem Relation Age of Onset  . Diabetes Mother   . Hypertension Mother   . Arthritis Mother   . Heart disease Mother   . Heart failure Mother   . Other Mother     benign breast mass  . Stroke Father   . Heart disease  Father   . Diabetes Paternal Grandmother   . Colon cancer Paternal Grandfather     Allergies  Allergen Reactions  . Olmesartan Medoxomil     REACTION: ? if cough  . Tetracycline Hcl     REACTION: unspecified  . Venlafaxine     REACTION: severe dry moouth    Current Outpatient Prescriptions on File Prior to Visit  Medication Sig Dispense Refill  . ALPRAZolam (XANAX) 0.25 MG tablet Take 1 tablet (0.25 mg total) by mouth at bedtime as needed.  30 tablet  1  . amLODipine (NORVASC) 5 MG tablet TAKE 1 & 1/2 TABLETS BY MOUTH DAILY  45 tablet  5  . aspirin 81 MG tablet Take 81 mg by mouth daily.        [name] atorvastatin (LIPITOR) 10 MG tablet Take 1 tablet (10 mg total) by mouth daily.  90 tablet  1  . buPROPion (WELLBUTRIN SR) 150 MG 12 hr tablet Take 150  mg by mouth daily.      [name] buPROPion (WELLBUTRIN SR) 150 MG 12 hr tablet TAKE 1 TABLET BY MOUTH TWICE DAILY  180 tablet  0  . escitalopram (LEXAPRO) 10 MG tablet Take 1 tablet (10 mg total) by mouth daily.  30 tablet  0  . fluticasone (FLONASE) 50 MCG/ACT nasal spray Place 2 sprays into the nose daily.  16 g  3  . furosemide (LASIX) 20 MG tablet TAKE 1 TABLET BY MOUTH ONCE DAILY AS DIRECTED  90 tablet  PRN  . glucose blood (TRUETEST TEST) test strip Use as instructed  100 each  12  . levothyroxine (SYNTHROID, LEVOTHROID) 125 MCG tablet Take 1 tablet (125 mcg total) by mouth daily.  90 tablet  1  . omeprazole (PRILOSEC) 20 MG capsule Take 1 capsule (20 mg total) by mouth 2 (two) times daily.  180 capsule  2  . promethazine (PHENERGAN) 50 MG tablet Take 25 mg by mouth every 6 (six) hours as needed.      . saxagliptin HCl (ONGLYZA) 5 MG TABS tablet Take 1 tablet (5 mg total) by mouth daily.  30 tablet  5  . telmisartan (MICARDIS) 40 MG tablet Take 1 tablet (40 mg total) by mouth daily.  30 tablet  11    BP 140/70  Pulse 89  Temp 98.5 F (36.9 C) (Oral)  Resp 16  Ht 5\' 6"  (1.676 m)  Wt 215 lb (97.523 kg)  BMI 34.70 kg/m2  SpO2 98%        Objective:   Physical Exam  Constitutional: She is oriented to person, place, and time. She appears well-developed and well-nourished. No distress.  Cardiovascular: Normal rate and regular rhythm.   No murmur heard. Pulmonary/Chest: Effort normal and breath sounds normal. No respiratory distress. She has no wheezes. She has no rales. She exhibits no tenderness.  Genitourinary:       Right nipple is retracted.  Firm irregular mass noted beneath right Areola.  Mass is approx 3 cm in diameter and is non-tender. No axillary LAD is noted.   Left breast is normal- no masses.   Musculoskeletal: She exhibits no edema.  Neurological: She is alert and oriented to person, place, and time.  Psychiatric: Her behavior is normal. Judgment and thought content normal.       Slightly tearful today.           Assessment & Plan:

## 2012-05-04 DIAGNOSIS — N6310 Unspecified lump in the right breast, unspecified quadrant: Secondary | ICD-10-CM | POA: Insufficient documentation

## 2012-05-04 LAB — HEMOGLOBIN A1C

## 2012-05-04 LAB — MICROALBUMIN / CREATININE URINE RATIO
Creatinine, Urine: 49.2 mg/dL
Microalb Creat Ratio: 18.7 mg/g (ref 0.0–30.0)

## 2012-05-04 LAB — HEPATITIS B SURFACE ANTIBODY,QUALITATIVE: Hep B S Ab: NONREACTIVE

## 2012-05-04 LAB — HEPATITIS B SURFACE ANTIGEN: Hepatitis B Surface Ag: NEGATIVE

## 2012-05-04 NOTE — Assessment & Plan Note (Signed)
Known hx of fatty liver.  Will order hepatitis serologies, ferritin to exclude any other contributing factors to her chronic elevation of LFTs.

## 2012-05-04 NOTE — Assessment & Plan Note (Signed)
Obtain A1C. 

## 2012-05-04 NOTE — Assessment & Plan Note (Signed)
She has not had a mammogram in 5 years.  Mass is highly suspicious for underlying malignancy.  Order diagnostic mammo.  Reinforced importance of following through with additional studies.

## 2012-05-07 ENCOUNTER — Telehealth: Payer: Self-pay | Admitting: *Deleted

## 2012-05-07 DIAGNOSIS — E119 Type 2 diabetes mellitus without complications: Secondary | ICD-10-CM

## 2012-05-07 NOTE — Telephone Encounter (Signed)
FYI: [to provider] Patient informed, understood & agreed; Future lab order placed, pt reports that she was able to get appointment w/[facility] for Monday at 9:30a/SLS

## 2012-05-07 NOTE — Telephone Encounter (Signed)
Message copied by Regis Bill on Fri May 07, 2012  8:56 AM ------      Message from: O'SULLIVAN, MELISSA      Created: Fri May 07, 2012  7:58 AM       Please call pt and let her know that thyroid function looks good, iron levels normal. Hepatitis screen negative.  Liver function testing remains elevated and is likely related to fatty liver.  Lab did not run A1C, apparently there was a problem with the sample.  Could you pls ask her to return to lab for A1C? thanks

## 2012-05-10 ENCOUNTER — Ambulatory Visit
Admission: RE | Admit: 2012-05-10 | Discharge: 2012-05-10 | Disposition: A | Discharge status: Home / Self Care | Payer: 59 | Source: Ambulatory Visit | Attending: Family | Admitting: Family

## 2012-05-10 ENCOUNTER — Other Ambulatory Visit: Payer: Self-pay | Admitting: Family

## 2012-05-10 ENCOUNTER — Telehealth: Payer: Self-pay | Admitting: *Deleted

## 2012-05-10 DIAGNOSIS — N6310 Unspecified lump in the right breast, unspecified quadrant: Secondary | ICD-10-CM

## 2012-05-10 NOTE — Telephone Encounter (Signed)
Spoke with the [facility] and verified that pt is scheduled for a breast biopsy on 05/21/12.

## 2012-05-10 NOTE — Telephone Encounter (Signed)
Message copied by Kathi Simpers on Mon May 10, 2012  4:58 PM ------      Message from: O'SULLIVAN, MELISSA      Created: Mon May 10, 2012  4:36 PM       Could you pls verify with breast center that they are scheduling biopsy? Should be done asap, pls ask dr. Rodena Medin to sign order if it comes through. Thanks! pls let me know.

## 2012-05-11 ENCOUNTER — Ambulatory Visit: Payer: 59 | Admitting: Internal Medicine

## 2012-05-11 LAB — HEMOGLOBIN A1C
Hgb A1c MFr Bld: 6.5 % — ABNORMAL HIGH (ref <5.7)
Mean Plasma Glucose: 140 mg/dL — ABNORMAL HIGH (ref <117)

## 2012-05-12 DIAGNOSIS — C50919 Malignant neoplasm of unspecified site of unspecified female breast: Secondary | ICD-10-CM

## 2012-05-12 HISTORY — PX: MASTECTOMY: SHX3

## 2012-05-12 HISTORY — DX: Malignant neoplasm of unspecified site of unspecified female breast: C50.919

## 2012-05-12 HISTORY — PX: OTHER SURGICAL HISTORY: SHX169

## 2012-05-13 NOTE — Telephone Encounter (Signed)
Reviewed mammogram results with pt and upcoming breast biopsy. Questions answered.

## 2012-05-14 ENCOUNTER — Other Ambulatory Visit: Payer: 59

## 2012-05-14 ENCOUNTER — Encounter: Payer: Self-pay | Admitting: Family

## 2012-05-21 ENCOUNTER — Ambulatory Visit
Admission: RE | Admit: 2012-05-21 | Discharge: 2012-05-21 | Disposition: A | Discharge status: Home / Self Care | Payer: 59 | Source: Ambulatory Visit | Attending: Family | Admitting: Family

## 2012-05-21 ENCOUNTER — Other Ambulatory Visit: Payer: Self-pay | Admitting: Family

## 2012-05-21 DIAGNOSIS — N6310 Unspecified lump in the right breast, unspecified quadrant: Secondary | ICD-10-CM

## 2012-05-24 ENCOUNTER — Ambulatory Visit
Admission: RE | Admit: 2012-05-24 | Discharge: 2012-05-24 | Disposition: A | Discharge status: Home / Self Care | Payer: 59 | Source: Ambulatory Visit | Attending: Family | Admitting: Family

## 2012-05-24 ENCOUNTER — Other Ambulatory Visit: Payer: Self-pay | Admitting: Family

## 2012-05-24 ENCOUNTER — Telehealth: Payer: Self-pay | Admitting: Family

## 2012-05-24 DIAGNOSIS — N6310 Unspecified lump in the right breast, unspecified quadrant: Secondary | ICD-10-CM

## 2012-05-24 DIAGNOSIS — C50911 Malignant neoplasm of unspecified site of right female breast: Secondary | ICD-10-CM

## 2012-05-24 MED ORDER — ALPRAZOLAM 0.25 MG PO TABS: 0.2500 mg | ORAL_TABLET | Freq: Three times a day (TID) | ORAL | Status: DC | PRN

## 2012-05-24 NOTE — Telephone Encounter (Signed)
Pt called our office upset re: recent breast cancer diagnosis today. Spoke with pt.  She is tearful.  Requesting refill on xanax.  Support provided, questions answered.  She is currently scheduled for MRI of the breast and consult with surgeon- Dr. Johna Sheriff.

## 2012-05-26 ENCOUNTER — Other Ambulatory Visit (INDEPENDENT_AMBULATORY_CARE_PROVIDER_SITE_OTHER): Payer: Self-pay

## 2012-05-26 DIAGNOSIS — C773 Secondary and unspecified malignant neoplasm of axilla and upper limb lymph nodes: Secondary | ICD-10-CM

## 2012-05-27 ENCOUNTER — Ambulatory Visit (INDEPENDENT_AMBULATORY_CARE_PROVIDER_SITE_OTHER): Payer: Commercial Managed Care - PPO | Admitting: General Surgery

## 2012-05-27 VITALS — BP 170/80 | HR 88 | Temp 98.7°F | Resp 18 | Ht 64.0 in | Wt 210.0 lb

## 2012-05-27 DIAGNOSIS — C50919 Malignant neoplasm of unspecified site of unspecified female breast: Secondary | ICD-10-CM | POA: Insufficient documentation

## 2012-05-27 NOTE — Progress Notes (Signed)
Subjective:   new diagnosis cancer right breast  Patient ID: [name], female   DOB: 08/28/1955, 56 y.o.   MRN: [id number]  HPI Patient is a 56-year-old female, currently working as a ward clerk in the emergency room, referred for new diagnosis of cancer the right breast. The patient recently noted a lump and some skin retraction in her right breast while showering. This was about 2 weeks ago. She has not had any previous history of breast cancer. She had a benign biopsy of the left breast years ago. There is no family history of breast cancer. Her last mammogram was about 8 years ago. She talk to her primary physician and was referred immediately to the breast center. Diagnostic mammogram and ultrasound were performed which I've reviewed. This shows an approximately 5 cm spiculated mass directly behind the right nipple with some thickening of the areolar skin. Ultrasound confirmed a similar mass. Also noted was a large 2.5 cm level I right axillary lymph node. Subsequently core biopsy was performed of the mass and of the lymph node. 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 Her lymph node was positive for metastatic mammary carcinoma. She is referred for discussion of surgical treatment options. She otherwise has been feeling relatively well.  Past Medical History  Diagnosis Date  . Depression   . DVT (deep venous thrombosis)     hx of on HRT left leg  . GERD (gastroesophageal reflux disease)   . Hyperlipidemia   . Hypertension   . Hypothyroidism   . PPD positive, treated     rx inh   . OSA on CPAP   . Diabetes mellitus   . 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 Tubal ligation   . Cholecystectomy   . Foot surgery   . Eye surgery   . Cataract extraction   . Percutaneous liver biopsy   . Breast biopsy     left breast   Current Outpatient Prescriptions  Medication Sig Dispense Refill  .  ALPRAZolam (XANAX) 0.25 MG tablet Take 1 tablet (0.25 mg total) by mouth 3 (three) times daily as needed.  30 tablet  0  . amLODipine (NORVASC) 5 MG tablet TAKE 1 & 1/2 TABLETS BY MOUTH DAILY  45 tablet  5  . aspirin 81 MG tablet Take 81 mg by mouth daily.        . atorvastatin (LIPITOR) 10 MG tablet Take 1 tablet (10 mg total) by mouth daily.  90 tablet  1  . buPROPion (WELLBUTRIN SR) 150 MG 12 hr tablet TAKE 1 TABLET BY MOUTH TWICE DAILY  180 tablet  0  . escitalopram (LEXAPRO) 10 MG tablet Take 1 tablet (10 mg total) by mouth daily.  30 tablet  0  . furosemide (LASIX) 20 MG tablet TAKE 1 TABLET BY MOUTH ONCE DAILY AS DIRECTED  90 tablet  PRN  . glucose blood (TRUETEST TEST) test strip Use as instructed  100 each  12  . levothyroxine (SYNTHROID, LEVOTHROID) 125 MCG tablet Take 1 tablet (125 mcg total) by mouth daily.  90 tablet  1  . omeprazole (PRILOSEC) 20 MG capsule Take 1 capsule (20 mg total) by mouth 2 (two) times daily.  180 capsule  2  . saxagliptin HCl (ONGLYZA) 5 MG TABS tablet Take 1 tablet (5 mg total) by mouth daily.  30 tablet  5  . buPROPion (WELLBUTRIN   SR) 150 MG 12 hr tablet Take 150 mg by mouth daily.      . fluticasone (FLONASE) 50 MCG/ACT nasal spray Place 2 sprays into the nose daily.  16 g  3  . promethazine (PHENERGAN) 50 MG tablet Take 25 mg by mouth every 6 (six) hours as needed.      . telmisartan (MICARDIS) 40 MG tablet Take 1 tablet (40 mg total) by mouth daily.  30 tablet  11   Allergies  Allergen Reactions  . Olmesartan Medoxomil     REACTION: ? if cough  . Tetracycline Hcl     REACTION: unspecified  . Venlafaxine     REACTION: severe dry moouth     Review of Systems  Constitutional: Negative.   Respiratory: Negative.   Cardiovascular: Negative.   Gastrointestinal: Negative.   Musculoskeletal: Negative.        Objective:   Physical Exam BP 170/80  Pulse 88  Temp 98.7 F (37.1 C)  Resp 18  Ht 5' 4" (1.626 m)  Wt 210 lb (95.255 kg)  BMI 36.05  kg/m2 General: Alert, Obese Caucasian female, in no distress Skin: Warm and dry without rash or infection. HEENT: No palpable masses or thyromegaly. Sclera nonicteric. Pupils equal round and reactive. Oropharynx clear. Breasts: There is extensive bruising of the right breast post biopsy. There is however a definite firm approximately 5 cm mass directly behind the nipple areolar complex or the 12:00 position. It is freely movable. I cannot feel any palpable axillary lymph nodes. No other masses in either breast. Lymph nodes: No cervical, supraclavicular, or inguinal nodes palpable. Lungs: Breath sounds clear and equal without increased work of breathing Cardiovascular: Regular rate and rhythm without murmur. No JVD or edema. Peripheral pulses intact. Abdomen: Nondistended. Soft and nontender. No masses palpable. No organomegaly. No palpable hernias. Extremities: No edema or joint swelling or deformity. No chronic venous stasis changes. Neurologic: Alert and fully oriented. Gait normal.     Assessment:     New diagnosis of clinical T2 N1, stage II B. 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 We discussed treatment of her cancer in general and specifically surgical treatment options. She feels very strongly and had decided prior to this visit but she wants to proceed with mastectomy immediately. We discussed the options of neoadjuvant treatment to try to shrink the tumor preoperatively for breast conservation although I think this would be unlikely to be successful due to the retroareolar location. She however is firm in her decision to proceed with immediate surgery which I think is very reasonable. We discussed it with her positive lymph node we would plan axillary dissection with total mastectomy. We discussed that she would likely need postoperative chemotherapy and radiation therapy. The indication for surgery were discussed in detail and its nature and recovery as well as risks of  bleeding, infection, and anesthetic complications and risks of lymphedema. She has complete literature regarding treatment of breast cancer all her and her husband's questions were answered.    Plan:     Right modified mastectomy under general anesthesia with an overnight hospitalization.      

## 2012-05-29 ENCOUNTER — Ambulatory Visit
Admission: RE | Admit: 2012-05-29 | Discharge: 2012-05-29 | Disposition: A | Discharge status: Home / Self Care | Payer: 59 | Source: Ambulatory Visit | Attending: Family | Admitting: Family

## 2012-05-29 DIAGNOSIS — C50911 Malignant neoplasm of unspecified site of right female breast: Secondary | ICD-10-CM

## 2012-05-29 MED ORDER — GADOBENATE DIMEGLUMINE 529 MG/ML IV SOLN
20.0000 mL | Freq: Once | INTRAVENOUS | Status: AC | PRN
  Administered 2012-05-29: 20 mL via INTRAVENOUS

## 2012-05-31 ENCOUNTER — Telehealth: Payer: Self-pay | Admitting: Family

## 2012-05-31 NOTE — Telephone Encounter (Signed)
Patient states that she has an appointment on 06/07/12 with [facility] but is having sx on 06/08/12 at [facility]. She wants to know if she really needs to keep this appointment?

## 2012-05-31 NOTE — Telephone Encounter (Signed)
No, lets plan to see her back in the end of February please.

## 2012-05-31 NOTE — Telephone Encounter (Signed)
Left message to return my call.  

## 2012-06-01 NOTE — Telephone Encounter (Signed)
Notified pt and transferred her to scheduler to rearrange appt.

## 2012-06-02 ENCOUNTER — Encounter (HOSPITAL_COMMUNITY): Payer: Self-pay | Admitting: Pharmacy Technician

## 2012-06-02 ENCOUNTER — Telehealth: Payer: Self-pay | Admitting: *Deleted

## 2012-06-02 NOTE — Telephone Encounter (Signed)
Confirmed 06/04/12 appt w/ pt.  Unable to mail before appt letter & packet to pt.  Emailed Christy at [facility] to make aware.  Took paperwork to [facility] for chart.

## 2012-06-03 ENCOUNTER — Encounter (HOSPITAL_COMMUNITY): Payer: Self-pay

## 2012-06-03 ENCOUNTER — Encounter (HOSPITAL_COMMUNITY)
Admission: RE | Admit: 2012-06-03 | Discharge: 2012-06-03 | Disposition: A | Discharge status: Home / Self Care | Payer: 59 | Source: Ambulatory Visit | Attending: General Surgery | Admitting: General Surgery

## 2012-06-03 ENCOUNTER — Ambulatory Visit (HOSPITAL_COMMUNITY)
Admission: RE | Admit: 2012-06-03 | Discharge: 2012-06-03 | Disposition: A | Discharge status: Home / Self Care | Payer: 59 | Source: Ambulatory Visit | Attending: General Surgery | Admitting: General Surgery

## 2012-06-03 DIAGNOSIS — Z01818 Encounter for other preprocedural examination: Secondary | ICD-10-CM | POA: Insufficient documentation

## 2012-06-03 DIAGNOSIS — Z01812 Encounter for preprocedural laboratory examination: Secondary | ICD-10-CM | POA: Insufficient documentation

## 2012-06-03 DIAGNOSIS — I517 Cardiomegaly: Secondary | ICD-10-CM | POA: Insufficient documentation

## 2012-06-03 LAB — SURGICAL PCR SCREEN: Staphylococcus aureus: POSITIVE — AB

## 2012-06-03 LAB — BASIC METABOLIC PANEL
BUN: 6 mg/dL (ref 6–23)
CO2: 25 mEq/L (ref 19–32)
Calcium: 9.1 mg/dL (ref 8.4–10.5)
Chloride: 103 mEq/L (ref 96–112)
Creatinine, Ser: 0.56 mg/dL (ref 0.50–1.10)

## 2012-06-03 LAB — CBC
HCT: 40.7 % (ref 36.0–46.0)
MCH: 25.9 pg — ABNORMAL LOW (ref 26.0–34.0)
MCHC: 32.4 g/dL (ref 30.0–36.0)
MCV: 79.8 fL (ref 78.0–100.0)
Platelets: 148 10*3/uL — ABNORMAL LOW (ref 150–400)
RDW: 15.9 % — ABNORMAL HIGH (ref 11.5–15.5)
WBC: 6.1 10*3/uL (ref 4.0–10.5)

## 2012-06-03 MED ORDER — CHLORHEXIDINE GLUCONATE 4 % EX LIQD: 1.0000 "application " | Freq: Once | CUTANEOUS | Status: DC

## 2012-06-03 NOTE — Pre-Procedure Instructions (Signed)
ELYSABETH [name]  06/03/2012   Your procedure is scheduled on: Tuesday June 08, 2012  Report to [facility] at 0530 AM.  Call this number if you have problems the morning of surgery: [phone]   Remember:   Do not eat food or drink liquids after midnight.   Take these medicines the morning of surgery with A SIP OF WATER: Xanax, Amlodipine, Wellbutrin, and Synthroid,   Do not wear jewelry, make-up or nail polish.  Do not wear lotions, powders, or perfumes. You may wear deodorant.  Do not shave 48 hours prior to surgery.   Do not bring valuables to the hospital.  Contacts, dentures or bridgework may not be worn into surgery.  Leave suitcase in the car. After surgery it may be brought to your room.  For patients admitted to the hospital, checkout time is 11:00 AM the day of  discharge.   Patients discharged the day of surgery will not be allowed to drive  home.  Name and phone number of your driver: Spouse  Special Instructions: Shower using CHG 2 nights before surgery and the night before surgery.  If you shower the day of surgery use CHG.  Use special wash - you have one bottle of CHG for all showers.  You should use approximately 1/3 of the bottle for each shower.   Please read over the following fact sheets that you were given: Pain Booklet, Coughing and Deep Breathing, MRSA Information and Surgical Site Infection Prevention

## 2012-06-04 ENCOUNTER — Telehealth: Payer: Self-pay | Admitting: Oncology

## 2012-06-04 ENCOUNTER — Ambulatory Visit: Payer: 59

## 2012-06-04 ENCOUNTER — Encounter: Payer: Self-pay | Admitting: Oncology

## 2012-06-04 ENCOUNTER — Ambulatory Visit (HOSPITAL_BASED_OUTPATIENT_CLINIC_OR_DEPARTMENT_OTHER): Payer: PRIVATE HEALTH INSURANCE | Admitting: Oncology

## 2012-06-04 ENCOUNTER — Other Ambulatory Visit (HOSPITAL_BASED_OUTPATIENT_CLINIC_OR_DEPARTMENT_OTHER): Payer: PRIVATE HEALTH INSURANCE | Admitting: Lab

## 2012-06-04 ENCOUNTER — Other Ambulatory Visit: Payer: Self-pay | Admitting: *Deleted

## 2012-06-04 VITALS — BP 157/69 | HR 94 | Temp 97.8°F | Resp 20 | Ht 64.0 in | Wt 210.7 lb

## 2012-06-04 DIAGNOSIS — Z170 Estrogen receptor positive status [ER+]: Secondary | ICD-10-CM

## 2012-06-04 DIAGNOSIS — C50019 Malignant neoplasm of nipple and areola, unspecified female breast: Secondary | ICD-10-CM

## 2012-06-04 DIAGNOSIS — C50919 Malignant neoplasm of unspecified site of unspecified female breast: Secondary | ICD-10-CM

## 2012-06-04 LAB — COMPREHENSIVE METABOLIC PANEL (CC13)
ALT: 35 U/L (ref 0–55)
AST: 44 U/L — ABNORMAL HIGH (ref 5–34)
Albumin: 3.4 g/dL — ABNORMAL LOW (ref 3.5–5.0)
Alkaline Phosphatase: 191 U/L — ABNORMAL HIGH (ref 40–150)
BUN: 7.1 mg/dL (ref 7.0–26.0)
Calcium: 9 mg/dL (ref 8.4–10.4)
Chloride: 109 mEq/L — ABNORMAL HIGH (ref 98–107)
Potassium: 3.9 mEq/L (ref 3.5–5.1)
Sodium: 140 mEq/L (ref 136–145)
Total Protein: 7.5 g/dL (ref 6.4–8.3)

## 2012-06-04 LAB — CANCER ANTIGEN 27.29: CA 27.29: 49 U/mL — ABNORMAL HIGH (ref 0–39)

## 2012-06-04 LAB — CBC WITH DIFFERENTIAL/PLATELET
BASO%: 0.2 % (ref 0.0–2.0)
Basophils Absolute: 0 10*3/uL (ref 0.0–0.1)
EOS%: 1.9 % (ref 0.0–7.0)
HGB: 12.4 g/dL (ref 11.6–15.9)
MCH: 25.5 pg (ref 25.1–34.0)
MCV: 77.9 fL — ABNORMAL LOW (ref 79.5–101.0)
MONO%: 6.9 % (ref 0.0–14.0)
RBC: 4.87 10*6/uL (ref 3.70–5.45)
RDW: 16.4 % — ABNORMAL HIGH (ref 11.2–14.5)
lymph#: 1.3 10*3/uL (ref 0.9–3.3)

## 2012-06-04 NOTE — Telephone Encounter (Signed)
gv pt appt schedule for February thru April.  °

## 2012-06-04 NOTE — Progress Notes (Signed)
Checked in new pt with no financial concerns. °

## 2012-06-05 NOTE — Progress Notes (Signed)
ID: [name]   DOB: 1955/11/30  MR#: [id number]  WUJ#:[id number]  PCP: [name]., NP GYN:  SU: Glenna Fellows OTHER NF:AOZHYQ Rodena Medin, Burna Mortimer Panosh   HISTORY OF PRESENT ILLNESS: [name] noted a mass in her right breast mid December, and as it did not spontaneously resolve over a couple of weeks she brought it to her primary physician's attention. She was set up for diagnostic mammography and right breast ultrasonography at the breast Center 05/10/2012. (Note that the patient's most recent prior mammography had been in October 2008). The current study showed a spiculated mass in the superior subareolar portion of the right breast measuring approximately 5 cm and associated with pleomorphic calcifications. This was firm and palpable. There was right nipple retraction and skin thickening. Ultrasound confirmed an irregularly marginated hypoechoic mass measuring 3.5 cm by ultrasound, and an abnormal appearing lower right axillary lymph node measuring 2.6 cm.  Biopsies of both the breast mass and the abnormal appearing lymph node were performed 05/21/2012. Both showed an invasive ductal carcinoma, grade 2, with similar prognostic panels (the breast mass was 100% estrogen and 73% progesterone receptor positive, with an MIB-1 of 5%; the lymph node was 100% estrogen 100% progesterone receptor positive, with an MIB-1 of 20%). Both masses were HER-2 negative.  Breast MRI obtained at [facility] imaging 05/29/2012 confirmed a dominant mass in the retroareolar right breast measuring 4.4 cm maximally. There was a satellite nodule inferior and lateral to this mass, measuring 1.7 cm. There were no other masses in either breast. Aside from the previously biopsied lymph node there were other mildly enhancing level I right axillary lymph nodes which did not appear pathologic. There was no other lymphadenopathy noted. The patient's subsequent history is as detailed below.   INTERVAL HISTORY: [name] was  seen at the breast clinic 06/04/2012 accompanied by her husband [name].  REVIEW OF SYSTEMS: She tolerated the right breast biopsy well, with some bleeding is the only complication. She feels very anxious and depressed, but denies unusual headaches, visual changes, cough, phlegm production, pleurisy, or change in bowel or bladder habits. There is no unusual or persistent pain, no history of unexplained fatigue or weight loss, no rash or fever. She does not exercise regularly. A detailed review of systems today was otherwise noncontributory.   PAST MEDICAL HISTORY: Past Medical History  Diagnosis Date  . Depression   . DVT (deep venous thrombosis)     hx of on HRT left leg  . GERD (gastroesophageal reflux disease)   . Hyperlipidemia   . Hypertension   . Hypothyroidism   . PPD positive, treated     rx inh   . OSA on CPAP   . Diabetes mellitus   . 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 Cancer     PAST SURGICAL HISTORY: Past Surgical History  Procedure Date  . Tubal ligation   . Cholecystectomy   . Foot surgery   . Eye surgery   . Cataract extraction   . Percutaneous liver biopsy   . Breast biopsy     left breast    FAMILY HISTORY Family History  Problem Relation Age of Onset  . Diabetes Mother   . Hypertension Mother   . Arthritis Mother   . Heart disease Mother   . Heart failure Mother   . Other Mother     benign breast mass  . Stroke Father   . Heart disease Father   . Diabetes Paternal Grandmother   .  Colon cancer Paternal Grandfather    the patient's father died in his 35s with a history of dementia. He had had prior strokes. The patient's mother died in her 89s, with a history of congestive heart failure. Lee-Anne had no brothers, one sister. There is no history of breast or ovarian cancer in the family.  GYNECOLOGIC HISTORY: Menarche age 83, first live birth age 79, she is GX P2, menopause approximately 15 years ago, on hormone replacement until  2010.  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 Her husband [name] works for [employer]. Daughter [name] is a Scientist, forensic and lives in [place]. Daughter [name] and her family (husband and 2 children aged 5 and 1-1/2 years) currently live with the patient. [name] is a member of a [employer].   ADVANCED DIRECTIVES: Not in place  HEALTH MAINTENANCE: History  Substance Use Topics  . Smoking status: Former Games developer  . Smokeless tobacco: Never Used  . Alcohol Use: No     Colonoscopy: Never  PAP: Does not recall  Bone density: Never  Lipid panel:  Allergies  Allergen Reactions  . Olmesartan Medoxomil     REACTION: ? if cough  . Tetracycline Hcl     REACTION: unspecified  . Venlafaxine     REACTION: severe dry moouth    Current Outpatient Prescriptions  Medication Sig Dispense Refill  . ALPRAZolam (XANAX) 0.25 MG tablet Take 1 tablet (0.25 mg total) by mouth 3 (three) times daily as needed.  30 tablet  0  . amLODipine (NORVASC) 5 MG tablet TAKE 1 & 1/2 TABLETS BY MOUTH DAILY  45 tablet  5  . buPROPion (WELLBUTRIN SR) 150 MG 12 hr tablet Take 150 mg by mouth daily.      [name] escitalopram (LEXAPRO) 10 MG tablet Take 1 tablet (10 mg total) by mouth daily.  30 tablet  0  . fluticasone (FLONASE) 50 MCG/ACT nasal spray Place 2 sprays into the nose daily.  16 g  3  . glucose blood (TRUETEST TEST) test strip Use as instructed  100 each  12  . levothyroxine (SYNTHROID, LEVOTHROID) 125 MCG tablet Take 1 tablet (125 mcg total) by mouth daily.  90 tablet  1  . omeprazole (PRILOSEC) 20 MG capsule Take 1 capsule (20 mg total) by mouth 2 (two) times daily.  180 capsule  2  . saxagliptin HCl (ONGLYZA) 5 MG TABS tablet Take 1 tablet (5 mg total) by mouth daily.  30 tablet  5  . telmisartan (MICARDIS) 40 MG tablet Take 1 tablet (40 mg total) by mouth daily.  30 tablet  11  . aspirin 81 MG tablet Take 81 mg by mouth  daily.        [name] atorvastatin (LIPITOR) 10 MG tablet Take 1 tablet (10 mg total) by mouth daily.  90 tablet  1    OBJECTIVE: Middle-aged white woman who appears anxious Filed Vitals:   06/04/12 1237  BP: 157/69  Pulse: 94  Temp: 97.8 F (36.6 C)  Resp: 20     Body mass index is 36.17 kg/(m^2).    ECOG FS: 0  Sclerae unicteric Oropharynx clear No cervical or supraclavicular adenopathy Lungs no rales or rhonchi Heart regular rate and rhythm Abd obese, benign MSK no focal spinal tenderness, no peripheral edema Neuro: nonfocal Breasts: The right breast is status post recent biopsy, with a significant ecchymosis inferiorly. The right breast mass is easily palpable under the  areola, and measures 4-5 cm by palpation. I do not palpate a well-defined right axillary lymph node. The left axilla and left breast are unremarkable.   LAB RESULTS: Lab Results  Component Value Date   WBC 5.3 06/04/2012   NEUTROABS 3.5 06/04/2012   HGB 12.4 06/04/2012   HCT 37.9 06/04/2012   MCV 77.9* 06/04/2012   PLT 138* 06/04/2012      Chemistry      Component Value Date/Time   NA 140 06/04/2012 1225   NA 140 06/03/2012 1557   K 3.9 06/04/2012 1225   K 3.7 06/03/2012 1557   CL 109* 06/04/2012 1225   CL 103 06/03/2012 1557   CO2 23 06/04/2012 1225   CO2 25 06/03/2012 1557   BUN 7.1 06/04/2012 1225   BUN 6 06/03/2012 1557   CREATININE 0.6 06/04/2012 1225   CREATININE 0.56 06/03/2012 1557   CREATININE 0.62 05/03/2012 1407      Component Value Date/Time   CALCIUM 9.0 06/04/2012 1225   CALCIUM 9.1 06/03/2012 1557   ALKPHOS 191* 06/04/2012 1225   ALKPHOS 156* 05/03/2012 1407   AST 44* 06/04/2012 1225   AST 57* 05/03/2012 1407   ALT 35 06/04/2012 1225   ALT 40* 05/03/2012 1407   BILITOT 0.59 06/04/2012 1225   BILITOT 0.8 05/03/2012 1407       Lab Results  Component Value Date   LABCA2 49* 06/04/2012    No components found with this basename: [id number]    No results found for this basename: INR:1;PROTIME:1  in the last 168 hours  Urinalysis    Component Value Date/Time   COLORURINE YELLOW 07/16/2011 1240   APPEARANCEUR CLEAR 07/16/2011 1240   LABSPEC 1.017 07/16/2011 1240   PHURINE 7.0 07/16/2011 1240   GLUCOSEU NEGATIVE 07/16/2011 1240   HGBUR NEGATIVE 07/16/2011 1240   HGBUR negative 09/06/2009 0830   BILIRUBINUR NEGATIVE 07/16/2011 1240   BILIRUBINUR n 09/23/2010   KETONESUR NEGATIVE 07/16/2011 1240   PROTEINUR NEGATIVE 07/16/2011 1240   UROBILINOGEN 1.0 07/16/2011 1240   UROBILINOGEN 1.0 09/23/2010   NITRITE NEGATIVE 07/16/2011 1240   NITRITE n 09/23/2010   LEUKOCYTESUR MODERATE* 07/16/2011 1240    STUDIES: Dg Chest 2 View  06/03/2012  *RADIOLOGY REPORT*  Clinical Data: Preadmission respiratory films.  CHEST - 2 VIEW  Comparison: CT and plain films of the chest 11/07/2008.  Findings: There is cardiomegaly without edema.  Peribronchial thickening is noted.  No consolidative process, pneumothorax or effusion.  IMPRESSION: Cardiomegaly and chronic bronchitic change.  No acute abnormality.   Original Report Authenticated By: Holley Dexter, M.D.    US Breast Right  05/10/2012  *RADIOLOGY REPORT*  Clinical Data:  Referring physician noted palpable mass within the right breast.  DIGITAL DIAGNOSTIC BILATERAL MAMMOGRAM WITH CAD AND RIGHT BREAST ULTRASOUND:  Comparison:  03/08/2007.  Findings:  ACR Breast Density Category 3: The breast tissue is heterogeneously dense.  There is an ill-defined, spiculated mass located within the superior subareolar portion of the right breast.  This is associated with right periareolar and areolar skin thickening and retraction.  The mass measures approximately 5 cm in size.  There are pleomorphic calcifications located within this mass.  The left breast is unchanged.  There are no additional findings. Mammographic images were processed with CAD.  On physical exam, There is a firm palpable mass located within the superior subareolar portion of the right breast which by physical  examination measures approximately 5 cm in size.  There is right sided nipple retraction and  there is thickening of the skin associated with the areola and right periareolar soft tissues.  Ultrasound is performed, showing an irregularly marginated hypoechoic mass with shadowing located within the subareolar portion of the right breast which by ultrasound measures 3.5 x 3.1 x 2.4 cm in size.  In addition, there is an abnormal appearing lower right axillary (level I) lymph node present with eccentric cortical thickening measuring 2.6 cm in size.  This is worrisome for a possible metastatic lymph node.  Tissue sampling of the right breast mass and right axillary lymph node is recommended.  I have discussed ultrasound-guided core biopsies of these areas with the patient.  This will be scheduled per patient preference.  IMPRESSION:  1.  Irregular mass located within the subareolar portion of the right breast which by mammography measures approximately 5 cm in size and is worrisome for invasive mammary carcinoma. 2.  2.6 cm abnormal appearing right axillary lymph node.  Tissue sampling of the breast mass and abnormal right axillary lymph node is recommended and ultrasound-guided core biopsy will be scheduled.  RECOMMENDATION:  Right breast ultrasound guided core biopsy as discussed above.  I have discussed the findings and recommendations with the patient. Results were also provided in writing at the conclusion of the visit.  BI-RADS CATEGORY 5:  Highly suggestive of malignancy - appropriate action should be taken.   Original Report Authenticated By: Rolla Plate, M.D.    Mr Breast Bilateral W Wo Contrast  05/31/2012  *RADIOLOGY REPORT*  Clinical Data: The patient presented in December, 2013 with a palpable right breast lump.  Subsequent imaging demonstrated a suspicious mass and an abnormal right axillary lymph node.  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  BILATERAL BREAST MRI WITH AND WITHOUT CONTRAST  Technique: Multiplanar, multisequence MR images of both breasts were obtained prior to and following the intravenous administration of 20ml of Multihance.  Three dimensional images were evaluated at the independent DynaCad workstation.  Comparison:  No prior MR.  Mammography 05/21/2012, 05/10/2012, dating back to 03/05/2006.  Right breast ultrasound 05/10/2012.  Findings: Moderate to extensive background parenchymal enhancement. Dominant mass in the retroareolar right breast, extending backwards from the nipple, with approximate measurements of 4.4 (AP) x 2.9 (mediolateral) x 3.1 cm (superoinferior).  Blooming artifact from the biopsy clip along the superior margin of this dominant mass. Satellite nodule immediately inferior and lateral to the dominant mass measuring approximately 1.3 x 1.4 x 1.7 cm.  Each of these masses demonstrate a plateau enhancement pattern (type 2).  No abnormal enhancement in the left breast to suggest contralateral disease.  Enhancing approximate 2.0 cm level I right axillary lymph node corresponding to the previously biopsied node.  Other mildly enhancing level I right axillary lymph nodes which do not appear pathologic.  No lymphadenopathy elsewhere.  IMPRESSION:  1.  Dominant retroareolar right breast mass, biopsy-proven invasive ductal carcinoma and DCIS. 2.  Satellite nodule inferior and lateral to the dominant mass, immediately adjacent to the dominant mass. 3.  Pathologic right axillary lymph node, biopsy-proven metastasis. No pathologic lymphadenopathy elsewhere.  RECOMMENDATION: Definitive treatment of known right breast cancer.  Second look ultrasound for the satellite nodule is not felt necessary as it is immediately adjacent to the dominant mass and should be included in the treatment zone.  THREE-DIMENSIONAL MR IMAGE RENDERING ON INDEPENDENT WORKSTATION:  Three-dimensional MR images were rendered  by post-processing of the original MR data on  an independent workstation.  The three- dimensional MR images were interpreted, and findings were reported in the accompanying complete MRI report for this study.  BI-RADS CATEGORY 6:  Known biopsy-proven malignancy - appropriate action should be taken.   Original Report Authenticated By: Hulan Saas, M.D.    Mm Digital Diagnostic Bilat  05/10/2012  *RADIOLOGY REPORT*  Clinical Data:  Referring physician noted palpable mass within the right breast.  DIGITAL DIAGNOSTIC BILATERAL MAMMOGRAM WITH CAD AND RIGHT BREAST ULTRASOUND:  Comparison:  03/08/2007.  Findings:  ACR Breast Density Category 3: The breast tissue is heterogeneously dense.  There is an ill-defined, spiculated mass located within the superior subareolar portion of the right breast.  This is associated with right periareolar and areolar skin thickening and retraction.  The mass measures approximately 5 cm in size.  There are pleomorphic calcifications located within this mass.  The left breast is unchanged.  There are no additional findings. Mammographic images were processed with CAD.  On physical exam, There is a firm palpable mass located within the superior subareolar portion of the right breast which by physical examination measures approximately 5 cm in size.  There is right sided nipple retraction and there is thickening of the skin associated with the areola and right periareolar soft tissues.  Ultrasound is performed, showing an irregularly marginated hypoechoic mass with shadowing located within the subareolar portion of the right breast which by ultrasound measures 3.5 x 3.1 x 2.4 cm in size.  In addition, there is an abnormal appearing lower right axillary (level I) lymph node present with eccentric cortical thickening measuring 2.6 cm in size.  This is worrisome for a possible metastatic lymph node.  Tissue sampling of the right breast mass and right axillary lymph node is recommended.  I  have discussed ultrasound-guided core biopsies of these areas with the patient.  This will be scheduled per patient preference.  IMPRESSION:  1.  Irregular mass located within the subareolar portion of the right breast which by mammography measures approximately 5 cm in size and is worrisome for invasive mammary carcinoma. 2.  2.6 cm abnormal appearing right axillary lymph node.  Tissue sampling of the breast mass and abnormal right axillary lymph node is recommended and ultrasound-guided core biopsy will be scheduled.  RECOMMENDATION:  Right breast ultrasound guided core biopsy as discussed above.  I have discussed the findings and recommendations with the patient. Results were also provided in writing at the conclusion of the visit.  BI-RADS CATEGORY 5:  Highly suggestive of malignancy - appropriate action should be taken.   Original Report Authenticated By: Rolla Plate, M.D.    Mm Digital Diagnostic Unilat R  05/25/2012  *RADIOLOGY REPORT*  Clinical Data:  right breast mass  DIGITAL DIAGNOSTIC RIGHT MAMMOGRAM  Comparison:  Previous exams.  Findings:  Films are performed following ultrasound guided biopsy of a mass in the 12 o'clock position of the right breast.  Marker clip projects in the 12 o'clock position on two views.  IMPRESSION: Appropriate clip placement.   Original Report Authenticated By: Esperanza Heir, M.D.    Mm Radiologist Eval And Mgmt  05/24/2012  *RADIOLOGY REPORT*  ESTABLISHED PATIENT OFFICE VISIT - LEVEL II [phone])  Chief Complaint:  The patient returns for discussion of the pathologic findings after ultrasound-guided core needle biopsy of a mass in the right breast and of an abnormal right axillary lymph node.  History:  The patient's physician felt a lump in the right breast 1 week ago.  The patient  has noted right nipple retraction for 1 week. Imaging evaluation demonstrateda right subareolar mass measuring at least 3 cm an abnormal right axillary lymph node. Ultrasound-guided  core needle biopsy of each of these areas was performed.  Exam:  There is extensive ecchymosis throughout the right breast. No palpable hematoma is noted.  The axillary biopsy site has a normal appearance.  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  This is concordant with the imaging findings.  Assessment and Plan:  Results were discussed with the patient and her husband.  She reports no complications from the procedure. Surgical consultation was scheduled with Dr. Johna Sheriff for 05/27/2012.  Breast MRI is scheduled for 05/29/2012.  The patient was given Field seismologist.   Original Report Authenticated By: Cain Saupe, M.D.    US Breast Bx W Loc Dev 1st Lesion Img Bx Spec US Guide  05/21/2012  *RADIOLOGY REPORT*  Clinical Data:  suspicious mass 12 o'clock subareolar right breast and suspicious right axillary lymph node  ULTRASOUND GUIDED VACUUM ASSISTED CORE BIOPSY OF THE RIGHT BREAST AND RIGHT AXILLA  Comparison: Previous exams.  I met with the patient and we discussed the procedure of ultrasound- guided biopsy, including benefits and alternatives.  We discussed the high likelihood of a successful procedure. We discussed the risks of the procedure including infection, bleeding, tissue injury, clip migration, and inadequate sampling.  Informed written consent was given.  Using sterile technique, 2% lidocaine ultrasound guidance and a 9 gauge vacuum assisted needle biopsy was performed of the right breast mass using a lateromedial approach.  At the conclusion of the procedure, a  tissue marker clip was deployed into the biopsy cavity.  Follow-up 2-view mammogram was performed and dictated separately.  Subsequently, using sterile technique, 2% lidocaine ultrasound guidance and a 12 gauge  needle,  biopsy was performed of right axillary lymph node using a lateromedial approach.  IMPRESSION:  Ultrasound-guided biopsy of suspicious right breast mass and suspicious right axillary lymph node.  No apparent complications.   Original Report Authenticated By: Esperanza Heir, M.D.    US Breast Bx W Loc Dev Ea Add Lesion Img Bx Spec US Guide  05/21/2012  *RADIOLOGY REPORT*  Clinical Data:  suspicious mass 12 o'clock subareolar right breast and suspicious right axillary lymph node  ULTRASOUND GUIDED VACUUM ASSISTED CORE BIOPSY OF THE RIGHT BREAST AND RIGHT AXILLA  Comparison: Previous exams.  I met with the patient and we discussed the procedure of ultrasound- guided biopsy, including benefits and alternatives.  We discussed the high likelihood of a successful procedure. We discussed the risks of the procedure including infection, bleeding, tissue injury, clip migration, and inadequate sampling.  Informed written consent was given.  Using sterile technique, 2% lidocaine ultrasound guidance and a 9 gauge vacuum assisted needle biopsy was performed of the right breast mass using a lateromedial approach.  At the conclusion of the procedure, a  tissue marker clip was deployed into the biopsy cavity.  Follow-up 2-view mammogram was performed and dictated separately.  Subsequently, using sterile technique, 2% lidocaine ultrasound guidance and a 12 gauge  needle,  biopsy was performed of right axillary lymph node using a lateromedial approach.  IMPRESSION: Ultrasound-guided biopsy of suspicious right breast mass and suspicious right axillary lymph node.  No apparent complications.   Original Report Authenticated By: Esperanza Heir, M.D.     ASSESSMENT: 57 y.o. [name] status post right breast and right axillary biopsy 05/21/2012  for a clinical T2 N1, stage IIB invasive ductal carcinoma, grade 2, strongly estrogen and progesterone receptor positive, with an MIB-1 between 5 and 20%, and no HER-2 amplification.  PLAN: I concur with Dr. Johna Sheriff that the possibility of breast conserving surgery here, even  with neoadjuvant chemotherapy, is very low. The patient is interested in  immediate mastectomy, and this is planned for the near future.  Today we spent the better part of her hour-plus visit discussing the biology of breast cancer, and the treatment options for breast cancer in general. She understands she will benefit from chemotherapy, and therefore we have requested a port to be placed at the time of her mastectomy. We are planning on cyclophosphamide and docetaxel for 4 cycles, to be followed by radiation and then  antiestrogen therapy, likely with an aromatase inhibitor. We have set up the patient for "chemotherapy school" and also discussed the possible toxicities, side effects, and complications of the chemotherapy she is about to receive. She will meet with my physician's assistant on February 20 to go over her final pathology and to discuss the patient is to take her anti-emetics. Target starting date for chemotherapy is February 25.  Incidentally the patient is noted to have a low MCV. We will send a ferritin within the next set of labs to assess this further.   MAGRINAT,GUSTAV C    06/05/2012

## 2012-06-07 ENCOUNTER — Telehealth: Payer: Self-pay | Admitting: *Deleted

## 2012-06-07 ENCOUNTER — Other Ambulatory Visit: Payer: Self-pay | Admitting: Oncology

## 2012-06-07 ENCOUNTER — Ambulatory Visit: Payer: 59 | Admitting: Family

## 2012-06-07 MED ORDER — CEFAZOLIN SODIUM-DEXTROSE 2-3 GM-% IV SOLR
2.0000 g | INTRAVENOUS | Status: AC
  Administered 2012-06-08: 2 g via INTRAVENOUS
  Filled 2012-06-07: qty 50

## 2012-06-07 NOTE — Telephone Encounter (Signed)
Received message from pt wanting to know if we received paperwork that she can submit to initiate her disability. States her surgery is tomorrow and she received a call that paperwork must be returned ASAP. Advised pt we just received paperwork today and will complete as soon as we can. Please advise when complete.

## 2012-06-08 ENCOUNTER — Ambulatory Visit (HOSPITAL_COMMUNITY): Payer: 59

## 2012-06-08 ENCOUNTER — Telehealth: Payer: Self-pay | Admitting: Family

## 2012-06-08 ENCOUNTER — Ambulatory Visit (HOSPITAL_COMMUNITY): Payer: 59 | Admitting: Anesthesiology

## 2012-06-08 ENCOUNTER — Encounter (HOSPITAL_COMMUNITY): Payer: Self-pay | Admitting: Anesthesiology

## 2012-06-08 ENCOUNTER — Encounter (HOSPITAL_COMMUNITY): Payer: Self-pay | Admitting: *Deleted

## 2012-06-08 ENCOUNTER — Observation Stay (HOSPITAL_COMMUNITY)
Admission: RE | Admit: 2012-06-08 | Discharge: 2012-06-09 | Disposition: A | Discharge status: Home / Self Care | Payer: 59 | Source: Ambulatory Visit | Attending: General Surgery | Admitting: General Surgery

## 2012-06-08 ENCOUNTER — Encounter (HOSPITAL_COMMUNITY)
Admission: RE | Disposition: A | Discharge status: Home / Self Care | Payer: Self-pay | Source: Ambulatory Visit | Attending: General Surgery

## 2012-06-08 DIAGNOSIS — C50919 Malignant neoplasm of unspecified site of unspecified female breast: Principal | ICD-10-CM | POA: Insufficient documentation

## 2012-06-08 DIAGNOSIS — F329 Major depressive disorder, single episode, unspecified: Secondary | ICD-10-CM | POA: Insufficient documentation

## 2012-06-08 DIAGNOSIS — Z86718 Personal history of other venous thrombosis and embolism: Secondary | ICD-10-CM | POA: Insufficient documentation

## 2012-06-08 DIAGNOSIS — E785 Hyperlipidemia, unspecified: Secondary | ICD-10-CM | POA: Insufficient documentation

## 2012-06-08 DIAGNOSIS — D0590 Unspecified type of carcinoma in situ of unspecified breast: Secondary | ICD-10-CM

## 2012-06-08 DIAGNOSIS — I10 Essential (primary) hypertension: Secondary | ICD-10-CM | POA: Insufficient documentation

## 2012-06-08 DIAGNOSIS — C773 Secondary and unspecified malignant neoplasm of axilla and upper limb lymph nodes: Secondary | ICD-10-CM | POA: Insufficient documentation

## 2012-06-08 DIAGNOSIS — E039 Hypothyroidism, unspecified: Secondary | ICD-10-CM | POA: Insufficient documentation

## 2012-06-08 DIAGNOSIS — G4733 Obstructive sleep apnea (adult) (pediatric): Secondary | ICD-10-CM | POA: Insufficient documentation

## 2012-06-08 DIAGNOSIS — F3289 Other specified depressive episodes: Secondary | ICD-10-CM | POA: Insufficient documentation

## 2012-06-08 DIAGNOSIS — E119 Type 2 diabetes mellitus without complications: Secondary | ICD-10-CM | POA: Insufficient documentation

## 2012-06-08 DIAGNOSIS — K219 Gastro-esophageal reflux disease without esophagitis: Secondary | ICD-10-CM | POA: Insufficient documentation

## 2012-06-08 HISTORY — PX: PORTACATH PLACEMENT: SHX2246

## 2012-06-08 HISTORY — PX: MASTECTOMY MODIFIED RADICAL: SHX5962

## 2012-06-08 LAB — GLUCOSE, CAPILLARY
Glucose-Capillary: 110 mg/dL — ABNORMAL HIGH (ref 70–99)
Glucose-Capillary: 160 mg/dL — ABNORMAL HIGH (ref 70–99)
Glucose-Capillary: 154 mg/dL — ABNORMAL HIGH (ref 70–99)

## 2012-06-08 SURGERY — MASTECTOMY, MODIFIED RADICAL
Anesthesia: General | Site: Chest | Laterality: Right | Wound class: Clean

## 2012-06-08 MED ORDER — MIDAZOLAM HCL 5 MG/5ML IJ SOLN
INTRAMUSCULAR | Status: DC | PRN
  Administered 2012-06-08: 2 mg via INTRAVENOUS

## 2012-06-08 MED ORDER — POTASSIUM CHLORIDE IN NACL 20-0.9 MEQ/L-% IV SOLN
INTRAVENOUS | Status: DC
  Administered 2012-06-08: 1000 mL via INTRAVENOUS
  Administered 2012-06-09: 02:00:00 via INTRAVENOUS
  Filled 2012-06-08 (×3): qty 1000

## 2012-06-08 MED ORDER — LACTATED RINGERS IV SOLN
INTRAVENOUS | Status: DC | PRN
  Administered 2012-06-08 (×2): via INTRAVENOUS

## 2012-06-08 MED ORDER — OXYCODONE-ACETAMINOPHEN 5-325 MG PO TABS
1.0000 | ORAL_TABLET | ORAL | Status: DC | PRN
  Administered 2012-06-09: 1 via ORAL
  Filled 2012-06-08: qty 1

## 2012-06-08 MED ORDER — PHENYLEPHRINE HCL 10 MG/ML IJ SOLN
INTRAMUSCULAR | Status: DC | PRN
  Administered 2012-06-08 (×3): 80 ug via INTRAVENOUS

## 2012-06-08 MED ORDER — LEVOTHYROXINE SODIUM 125 MCG PO TABS
125.0000 ug | ORAL_TABLET | Freq: Every day | ORAL | Status: DC
  Administered 2012-06-09: 125 ug via ORAL
  Filled 2012-06-08 (×2): qty 1

## 2012-06-08 MED ORDER — BUPROPION HCL ER (SR) 150 MG PO TB12
150.0000 mg | ORAL_TABLET | Freq: Every day | ORAL | Status: DC
  Administered 2012-06-09: 150 mg via ORAL
  Filled 2012-06-08 (×2): qty 1

## 2012-06-08 MED ORDER — VECURONIUM BROMIDE 10 MG IV SOLR
INTRAVENOUS | Status: DC | PRN
  Administered 2012-06-08: 6 mg via INTRAVENOUS

## 2012-06-08 MED ORDER — FENTANYL CITRATE 0.05 MG/ML IJ SOLN
INTRAMUSCULAR | Status: DC | PRN
  Administered 2012-06-08 (×2): 50 ug via INTRAVENOUS
  Administered 2012-06-08: 150 ug via INTRAVENOUS

## 2012-06-08 MED ORDER — ONDANSETRON HCL 4 MG/2ML IJ SOLN
4.0000 mg | Freq: Four times a day (QID) | INTRAMUSCULAR | Status: DC | PRN
  Administered 2012-06-08: 4 mg via INTRAVENOUS
  Filled 2012-06-08: qty 2

## 2012-06-08 MED ORDER — HEPARIN SODIUM (PORCINE) 5000 UNIT/ML IJ SOLN
INTRAMUSCULAR | Status: AC
  Administered 2012-06-08: 5000 [IU]
  Filled 2012-06-08: qty 1

## 2012-06-08 MED ORDER — HYDROMORPHONE HCL PF 1 MG/ML IJ SOLN
INTRAMUSCULAR | Status: AC
  Filled 2012-06-08: qty 2

## 2012-06-08 MED ORDER — PROPOFOL 10 MG/ML IV BOLUS
INTRAVENOUS | Status: DC | PRN
  Administered 2012-06-08: 200 mg via INTRAVENOUS

## 2012-06-08 MED ORDER — PROMETHAZINE HCL 25 MG/ML IJ SOLN
6.2500 mg | INTRAMUSCULAR | Status: DC | PRN
  Administered 2012-06-08: 12.5 mg via INTRAVENOUS

## 2012-06-08 MED ORDER — SODIUM CHLORIDE 0.9 % IR SOLN
Status: DC | PRN
  Administered 2012-06-08: 08:00:00

## 2012-06-08 MED ORDER — INSULIN ASPART 100 UNIT/ML ~~LOC~~ SOLN
0.0000 [IU] | Freq: Three times a day (TID) | SUBCUTANEOUS | Status: DC
  Administered 2012-06-08 (×2): 2 [IU] via SUBCUTANEOUS

## 2012-06-08 MED ORDER — HEPARIN SODIUM (PORCINE) 5000 UNIT/ML IJ SOLN
5000.0000 [IU] | Freq: Three times a day (TID) | INTRAMUSCULAR | Status: DC
  Administered 2012-06-08 – 2012-06-09 (×2): 5000 [IU] via SUBCUTANEOUS
  Filled 2012-06-08 (×5): qty 1

## 2012-06-08 MED ORDER — MIDAZOLAM HCL 2 MG/2ML IJ SOLN: 1.0000 mg | INTRAMUSCULAR | Status: DC | PRN

## 2012-06-08 MED ORDER — LINAGLIPTIN 5 MG PO TABS
5.0000 mg | ORAL_TABLET | Freq: Every day | ORAL | Status: DC
  Administered 2012-06-08 – 2012-06-09 (×2): 5 mg via ORAL
  Filled 2012-06-08 (×2): qty 1

## 2012-06-08 MED ORDER — BUPIVACAINE HCL 0.25 % IJ SOLN
INTRAMUSCULAR | Status: DC | PRN
  Administered 2012-06-08: 15 mL

## 2012-06-08 MED ORDER — PANTOPRAZOLE SODIUM 40 MG PO TBEC
40.0000 mg | DELAYED_RELEASE_TABLET | Freq: Every day | ORAL | Status: DC
  Administered 2012-06-08 – 2012-06-09 (×2): 40 mg via ORAL
  Filled 2012-06-08 (×2): qty 1

## 2012-06-08 MED ORDER — HEPARIN SOD (PORK) LOCK FLUSH 100 UNIT/ML IV SOLN
INTRAVENOUS | Status: DC | PRN
  Administered 2012-06-08: 500 [IU] via INTRAVENOUS

## 2012-06-08 MED ORDER — ONDANSETRON HCL 4 MG/2ML IJ SOLN
INTRAMUSCULAR | Status: DC | PRN
  Administered 2012-06-08: 4 mg via INTRAVENOUS

## 2012-06-08 MED ORDER — PROMETHAZINE HCL 25 MG/ML IJ SOLN
INTRAMUSCULAR | Status: AC
  Filled 2012-06-08: qty 1

## 2012-06-08 MED ORDER — ONDANSETRON HCL 4 MG PO TABS: 4.0000 mg | ORAL_TABLET | Freq: Four times a day (QID) | ORAL | Status: DC | PRN

## 2012-06-08 MED ORDER — GLYCOPYRROLATE 0.2 MG/ML IJ SOLN
INTRAMUSCULAR | Status: DC | PRN
  Administered 2012-06-08: 0.4 mg via INTRAVENOUS

## 2012-06-08 MED ORDER — 0.9 % SODIUM CHLORIDE (POUR BTL) OPTIME
TOPICAL | Status: DC | PRN
  Administered 2012-06-08: 1000 mL

## 2012-06-08 MED ORDER — ESCITALOPRAM OXALATE 10 MG PO TABS
10.0000 mg | ORAL_TABLET | Freq: Every day | ORAL | Status: DC
  Administered 2012-06-08 – 2012-06-09 (×2): 10 mg via ORAL
  Filled 2012-06-08 (×2): qty 1

## 2012-06-08 MED ORDER — HEPARIN SOD (PORK) LOCK FLUSH 100 UNIT/ML IV SOLN
INTRAVENOUS | Status: AC
  Filled 2012-06-08: qty 5

## 2012-06-08 MED ORDER — LIDOCAINE HCL (CARDIAC) 20 MG/ML IV SOLN
INTRAVENOUS | Status: DC | PRN
  Administered 2012-06-08: 50 mg via INTRAVENOUS

## 2012-06-08 MED ORDER — NEOSTIGMINE METHYLSULFATE 1 MG/ML IJ SOLN
INTRAMUSCULAR | Status: DC | PRN
  Administered 2012-06-08: 2 mg via INTRAVENOUS

## 2012-06-08 MED ORDER — ALPRAZOLAM 0.25 MG PO TABS: 0.2500 mg | ORAL_TABLET | Freq: Three times a day (TID) | ORAL | Status: DC | PRN

## 2012-06-08 MED ORDER — FLUTICASONE PROPIONATE 50 MCG/ACT NA SUSP
2.0000 | Freq: Every day | NASAL | Status: DC
  Administered 2012-06-09: 2 via NASAL
  Filled 2012-06-08: qty 16

## 2012-06-08 MED ORDER — FENTANYL CITRATE 0.05 MG/ML IJ SOLN: 50.0000 ug | Freq: Once | INTRAMUSCULAR | Status: DC

## 2012-06-08 MED ORDER — SUCCINYLCHOLINE CHLORIDE 20 MG/ML IJ SOLN
INTRAMUSCULAR | Status: DC | PRN
  Administered 2012-06-08: 120 mg via INTRAVENOUS

## 2012-06-08 MED ORDER — OXYCODONE HCL 5 MG/5ML PO SOLN: 5.0000 mg | Freq: Once | ORAL | Status: DC | PRN

## 2012-06-08 MED ORDER — HYDROMORPHONE HCL PF 1 MG/ML IJ SOLN: 0.2500 mg | INTRAMUSCULAR | Status: DC | PRN

## 2012-06-08 MED ORDER — OXYCODONE HCL 5 MG PO TABS: 5.0000 mg | ORAL_TABLET | Freq: Once | ORAL | Status: DC | PRN

## 2012-06-08 MED ORDER — ACETAMINOPHEN 10 MG/ML IV SOLN
1000.0000 mg | Freq: Once | INTRAVENOUS | Status: DC
  Filled 2012-06-08: qty 100

## 2012-06-08 MED ORDER — BUPIVACAINE-EPINEPHRINE PF 0.25-1:200000 % IJ SOLN
INTRAMUSCULAR | Status: AC
  Filled 2012-06-08: qty 30

## 2012-06-08 MED ORDER — AMLODIPINE BESYLATE 5 MG PO TABS
5.0000 mg | ORAL_TABLET | Freq: Every day | ORAL | Status: DC
  Administered 2012-06-09: 5 mg via ORAL
  Filled 2012-06-08 (×2): qty 1

## 2012-06-08 MED ORDER — MORPHINE SULFATE 2 MG/ML IJ SOLN
2.0000 mg | INTRAMUSCULAR | Status: DC | PRN
  Administered 2012-06-08 (×2): 4 mg via INTRAVENOUS
  Administered 2012-06-08: 2 mg via INTRAVENOUS
  Filled 2012-06-08 (×3): qty 2

## 2012-06-08 MED ORDER — HEPARIN SODIUM (PORCINE) 5000 UNIT/ML IJ SOLN: 5000.0000 [IU] | Freq: Once | INTRAMUSCULAR | Status: DC

## 2012-06-08 MED ORDER — ATORVASTATIN CALCIUM 10 MG PO TABS
10.0000 mg | ORAL_TABLET | Freq: Every day | ORAL | Status: DC
  Administered 2012-06-08 – 2012-06-09 (×2): 10 mg via ORAL
  Filled 2012-06-08 (×2): qty 1

## 2012-06-08 SURGICAL SUPPLY — 71 items
ADH SKN CLS APL DERMABOND .7 (GAUZE/BANDAGES/DRESSINGS) ×2
ADH SKN CLS LQ APL DERMABOND (GAUZE/BANDAGES/DRESSINGS) ×2
APPLIER CLIP 11 MED OPEN (CLIP) ×3
APR CLP MED 11 20 MLT OPN (CLIP) ×2
BINDER BREAST LRG (GAUZE/BANDAGES/DRESSINGS) IMPLANT
BINDER BREAST XLRG (GAUZE/BANDAGES/DRESSINGS) IMPLANT
BLADE SURG 11 STRL SS (BLADE) ×1 IMPLANT
BLADE SURG 15 STRL LF DISP TIS (BLADE) IMPLANT
BLADE SURG 15 STRL SS (BLADE) ×3
CANISTER SUCTION 2500CC (MISCELLANEOUS) ×3 IMPLANT
CHLORAPREP W/TINT 26ML (MISCELLANEOUS) ×3 IMPLANT
CLIP APPLIE 11 MED OPEN (CLIP) IMPLANT
CLIP TI MEDIUM 6 (CLIP) ×4 IMPLANT
CLOTH BEACON ORANGE TIMEOUT ST (SAFETY) ×4 IMPLANT
COVER SURGICAL LIGHT HANDLE (MISCELLANEOUS) ×4 IMPLANT
DERMABOND ADHESIVE PROPEN (GAUZE/BANDAGES/DRESSINGS) ×1
DERMABOND ADVANCED (GAUZE/BANDAGES/DRESSINGS) ×1
DERMABOND ADVANCED .7 DNX12 (GAUZE/BANDAGES/DRESSINGS) ×2 IMPLANT
DERMABOND ADVANCED .7 DNX6 (GAUZE/BANDAGES/DRESSINGS) IMPLANT
DRAIN CHANNEL 19F RND (DRAIN) ×3 IMPLANT
DRAPE C-ARM 42X72 X-RAY (DRAPES) ×1 IMPLANT
DRAPE CHEST BREAST 15X10 FENES (DRAPES) ×3 IMPLANT
DRAPE PED LAPAROTOMY (DRAPES) ×1 IMPLANT
DRAPE UTILITY 15X26 W/TAPE STR (DRAPE) ×6 IMPLANT
DRAPE UTILITY XL STRL (DRAPES) ×2 IMPLANT
ELECT CAUTERY BLADE 6.4 (BLADE) ×1 IMPLANT
ELECT REM PT RETURN 9FT ADLT (ELECTROSURGICAL) ×3
ELECTRODE REM PT RTRN 9FT ADLT (ELECTROSURGICAL) ×2 IMPLANT
EVACUATOR SILICONE 100CC (DRAIN) ×4 IMPLANT
GAUZE SPONGE 4X4 16PLY XRAY LF (GAUZE/BANDAGES/DRESSINGS) ×1 IMPLANT
GLOVE BIOGEL PI IND STRL 7.5 (GLOVE) IMPLANT
GLOVE BIOGEL PI IND STRL 8 (GLOVE) ×2 IMPLANT
GLOVE BIOGEL PI INDICATOR 7.5 (GLOVE) ×2
GLOVE BIOGEL PI INDICATOR 8 (GLOVE) ×2
GLOVE EUDERMIC 7 POWDERFREE (GLOVE) ×1 IMPLANT
GLOVE SS BIOGEL STRL SZ 7.5 (GLOVE) ×2 IMPLANT
GLOVE SUPERSENSE BIOGEL SZ 7.5 (GLOVE) ×2
GOWN PREVENTION PLUS XLARGE (GOWN DISPOSABLE) ×5 IMPLANT
GOWN STRL NON-REIN LRG LVL3 (GOWN DISPOSABLE) ×6 IMPLANT
KIT BASIN OR (CUSTOM PROCEDURE TRAY) ×3 IMPLANT
KIT PORT POWER 8FR ISP CVUE (Catheter) ×1 IMPLANT
KIT ROOM TURNOVER OR (KITS) ×3 IMPLANT
MARKER SKIN DUAL TIP RULER LAB (MISCELLANEOUS) ×1 IMPLANT
NDL HYPO 25GX1X1/2 BEV (NEEDLE) IMPLANT
NEEDLE HYPO 25GX1X1/2 BEV (NEEDLE) ×3 IMPLANT
NS IRRIG 1000ML POUR BTL (IV SOLUTION) ×3 IMPLANT
PACK GENERAL/GYN (CUSTOM PROCEDURE TRAY) ×3 IMPLANT
PACK SURGICAL SETUP 50X90 (CUSTOM PROCEDURE TRAY) ×1 IMPLANT
PAD ARMBOARD 7.5X6 YLW CONV (MISCELLANEOUS) ×3 IMPLANT
PENCIL BUTTON BLDE SNGL 10FT (ELECTRODE) ×1 IMPLANT
SPECIMEN JAR X LARGE (MISCELLANEOUS) ×3 IMPLANT
SPONGE GAUZE 4X4 12PLY (GAUZE/BANDAGES/DRESSINGS) ×3 IMPLANT
SPONGE LAP 18X18 X RAY DECT (DISPOSABLE) ×1 IMPLANT
STAPLER VISISTAT 35W (STAPLE) ×1 IMPLANT
SUT ETHILON 2 0 FS 18 (SUTURE) ×4 IMPLANT
SUT MNCRL AB 4-0 PS2 18 (SUTURE) ×1 IMPLANT
SUT MON AB 5-0 PS2 18 (SUTURE) ×3 IMPLANT
SUT SILK 3 0 (SUTURE) ×3
SUT SILK 3-0 18XBRD TIE 12 (SUTURE) IMPLANT
SUT VIC AB 3-0 SH 18 (SUTURE) ×4 IMPLANT
SUT VIC AB 3-0 SH 27 (SUTURE) ×3
SUT VIC AB 3-0 SH 27XBRD (SUTURE) IMPLANT
SYR 20ML ECCENTRIC (SYRINGE) ×2 IMPLANT
SYR 5ML LUER SLIP (SYRINGE) ×1 IMPLANT
SYR BULB IRRIGATION 50ML (SYRINGE) ×1 IMPLANT
SYR CONTROL 10ML LL (SYRINGE) ×1 IMPLANT
TOWEL OR 17X24 6PK STRL BLUE (TOWEL DISPOSABLE) ×3 IMPLANT
TOWEL OR 17X26 10 PK STRL BLUE (TOWEL DISPOSABLE) ×3 IMPLANT
TOWEL OR NON WOVEN STRL DISP B (DISPOSABLE) ×2 IMPLANT
TUBE CONNECTING 12X1/4 (SUCTIONS) ×1 IMPLANT
YANKAUER SUCT BULB TIP NO VENT (SUCTIONS) ×1 IMPLANT

## 2012-06-08 NOTE — Telephone Encounter (Signed)
Paper work is complete.

## 2012-06-08 NOTE — Op Note (Signed)
Preoperative Diagnosis: cancer right breast  Postoprative Diagnosis: cancer right breast  Procedure: Procedure(s): MASTECTOMY MODIFIED RADICAL INSERTION PORT-A-CATH   Surgeon: Glenna Fellows T   Assistants: Cyndia Bent MD  Anesthesia:  General endotracheal anesthesiaDiagnos  Indications:   Patient is a 57 year old female with a recent diagnosis of T2 N1 cancer of the right breast with a central breast mass an approximately 3 cm axillary node. After initial discussion of the surgical treatment options she has elected to proceed with immediate modified mastectomy. She also required a Port-A-Cath for chemotherapy. We've discussed the procedures in detail documented elsewhere.  Procedure Detail:  Patient is brought to the operating room, placed in the supine position on the operating table, and general endotracheal anesthesia induced. She was carefully positioned with her right arm extended on an armboard and padded and the entire right chest and axilla and arm were widely sterilely prepped and draped. She received preoperative IV antibiotics. PAS were in place. Patient timeout was performed and correct procedure verified. I planned an obliquely oriented elliptical incision encompassing the nipple areolar complex and extending up toward the axilla. The incision was made and dissection carried down into the subcutaneous tissue. The skin and subcutaneous flaps were then raised superiorly toward the clavicle, medially to the edge of the sternum, inferiorly to the origin of the rectus and laterally out to the anterior border of the latissimus which was dissected and clearly defined. The breast was then reflected off the chest wall with cautery working medial to lateral freeing it from the pectoralis major and the lateral border of the pectoralis major. It was dissected off the serratus and off of the inferior aspect of latissimus with cautery. The pectoralis major was then retracted medially and  the clavipectoral fascia identified and incised and the pectoralis minor retracted medially. The axillary vein was identified. There was a large but freely movable lymph node noted in the axilla. There were some possibly enlarged nodes more medial to this as well. Beginning at least at the medial border of the pectoralis minor retracting it medially all fibrofatty tissue and nodal tissue inferior to the axillary vein was swept laterally and inferiorly. Perforating vessels from the axillary artery and vein were divided between clips or clamped and tied with silk. As the dissection progressed laterally along the chest wall protecting the axillary vein we identified the long thoracic nerve and the thoracodorsal vessels and nerve more laterally. These structures were carefully protected at all fibrofatty tissue between them down to the subscapularis muscle was swept inferiorly. Final attachments along the serratus and latissimus were divided and the specimen was removed. It was oriented with a suture and sent for permanent section. The wound was thoroughly irrigated with saline and hemostasis assured. 2 closed suction 19 Blake drains were left through separate stab wounds underneath the flaps. The subcutaneous tissue was closed with interrupted 3-0 Vicryl and the skin with staples.  Following this the patient was repositioned and reprepped for Port-A-Cath placement the left side. Patient timeout was again performed. The left subclavian vein was cannulated with a needle and guidewire without difficulty confirmed by fluoroscopy. The introducer was then passed over the guidewire and the flush catheter placed via the introducer which was stripped away and the tip of the catheter positioned in the superior vena cava by fluoroscopy. A small incision was made on the anterior chest wall a the subcutaneous pocket created. The catheter was tunneled subcutaneously into the pocket and attached to the port which was sutured to  the  pocket with 2 interrupted Prolene sutures. The subcutaneous tissue was closed with interrupted 4-0 Monocryl and the skin closed with subcuticular 4-0 Monocryl and Dermabond. The port was accessed and it aspirated and flushed easily and was left flushed with concentrated heparin solution. Sponge needle and instrument counts were correct.   Estimated Blood Loss:  less than 50 mL         Drains: 19 round Blake drain x2  Blood Given: none          Specimens: right total mastectomy and axillary contents (modified mastectomy)        Complications:  * No complications entered in OR log *         Disposition: PACU - hemodynamically stable.         Condition: stable

## 2012-06-08 NOTE — Anesthesia Postprocedure Evaluation (Signed)
  Anesthesia Post-op Note  Patient: [name]  Procedure(s) Performed: Procedure(s) (LRB) with comments: MASTECTOMY MODIFIED RADICAL (Right) INSERTION PORT-A-CATH (Left)  Patient Location: PACU  Anesthesia Type:General  Level of Consciousness: awake  Airway and Oxygen Therapy: Patient Spontanous Breathing  Post-op Pain: mild  Post-op Assessment: Post-op Vital signs reviewed, Patient's Cardiovascular Status Stable, Respiratory Function Stable, Patent Airway, No signs of Nausea or vomiting and Pain level controlled  Post-op Vital Signs: stable  Complications: No apparent anesthesia complications

## 2012-06-08 NOTE — Preoperative (Signed)
Beta Blockers   Reason not to administer Beta Blockers:Not Applicable 

## 2012-06-08 NOTE — Anesthesia Preprocedure Evaluation (Addendum)
Anesthesia Evaluation  Patient identified by MRN, date of birth, ID band Patient awake    Reviewed: Allergy & Precautions, H&P , NPO status , Patient's Chart, lab work & pertinent test results  Airway Mallampati: II TM Distance: <3 FB Neck ROM: Full  Mouth opening: Limited Mouth Opening  Dental  (+) Teeth Intact and Dental Advidsory Given   Pulmonary shortness of breath and with exertion, sleep apnea ,  +PPD   + decreased breath sounds      Cardiovascular hypertension, On Medications Rhythm:Regular Rate:Normal     Neuro/Psych PSYCHIATRIC DISORDERS Depression    GI/Hepatic GERD-  Medicated,(+)   ascites     ,   Endo/Other  diabetesHypothyroidism   Renal/GU      Musculoskeletal   Abdominal (+) + obese,   Peds  Hematology  (+) Blood dyscrasia, ,   Anesthesia Other Findings   Reproductive/Obstetrics                         Anesthesia Physical Anesthesia Plan  ASA: III  Anesthesia Plan: General   Post-op Pain Management:    Induction: Intravenous  Airway Management Planned: Oral ETT  Additional Equipment:   Intra-op Plan:   Post-operative Plan: Extubation in OR  Informed Consent: I have reviewed the patients History and Physical, chart, labs and discussed the procedure including the risks, benefits and alternatives for the proposed anesthesia with the patient or authorized representative who has indicated his/her understanding and acceptance.     Plan Discussed with: CRNA and Surgeon  Anesthesia Plan Comments:         Anesthesia Quick Evaluation

## 2012-06-08 NOTE — Anesthesia Procedure Notes (Signed)
Procedure Name: Intubation Date/Time: 06/08/2012 7:38 AM Performed by: Carmela Rima Pre-anesthesia Checklist: Emergency Drugs available, Patient identified, Suction available, Patient being monitored and Timeout performed Patient Re-evaluated:Patient Re-evaluated prior to inductionOxygen Delivery Method: Circle system utilized Preoxygenation: Pre-oxygenation with 100% oxygen Intubation Type: IV induction and Rapid sequence Ventilation: Mask ventilation without difficulty Grade View: Grade III Tube size: 7.5 mm Number of attempts: 2 Airway Equipment and Method: Video-laryngoscopy Placement Confirmation: ETT inserted through vocal cords under direct vision,  breath sounds checked- equal and bilateral and positive ETCO2 Secured at: 21 cm Tube secured with: Tape Dental Injury: Teeth and Oropharynx as per pre-operative assessment  Difficulty Due To: Difficulty was anticipated, Difficult Airway- due to anterior larynx and Difficult Airway- due to limited oral opening Comments: Small Chin and anterior anatomy on DL

## 2012-06-08 NOTE — Transfer of Care (Signed)
Immediate Anesthesia Transfer of Care Note  Patient: [name]  Procedure(s) Performed: Procedure(s) (LRB) with comments: MASTECTOMY MODIFIED RADICAL (Right) INSERTION PORT-A-CATH (Left)  Patient Location: PACU  Anesthesia Type:General  Level of Consciousness: awake, alert  and oriented  Airway & Oxygen Therapy: Patient Spontanous Breathing and Patient connected to face mask oxygen  Post-op Assessment: Report given to PACU RN, Post -op Vital signs reviewed and stable and Patient moving all extremities X 4  Post vital signs: Reviewed and stable  Complications: No apparent anesthesia complications

## 2012-06-08 NOTE — Interval H&P Note (Signed)
History and Physical Interval Note:  06/08/2012 7:28 AM  [name]  has presented today for surgery, with the diagnosis of cancer right breast  The various methods of treatment have been discussed with the patient and family. After consideration of risks, benefits and other options for treatment, the patient has consented to  Procedure(s) (LRB) with comments: MASTECTOMY MODIFIED RADICAL (Right) as a surgical intervention .  The patient's history has been reviewed, patient examined, no change in status, stable for surgery.  I have reviewed the patient's chart and labs.  Questions were answered to the patient's satisfaction.    Pt will also require Port-A-Cath for post op chemotherapy.  I discussed this with the patient including indications, nature of the surgery and specific risks of PTX, beleding, infection, thrombosis, occlusion and DVT.  She understands and agrees to proceed.   Deronte Solis T

## 2012-06-08 NOTE — Telephone Encounter (Signed)
Forms were faxed to [facility]. Notified pt's husband. He states that he spoke with human resources and they are going to send paperwork to him to fax to [name] for completion. Fax number given to Dorinda Hill; awaiting fax.

## 2012-06-08 NOTE — H&P (View-Only) (Signed)
Subjective:   new diagnosis cancer right breast  Patient ID: [name], female   DOB: 11/16/1955, 57 y.o.   MRN: [id number]  HPI Patient is a 57 year old female, currently working as a ward clerk in the emergency room, referred for new diagnosis of cancer the right breast. The patient recently noted a lump and some skin retraction in her right breast while showering. This was about 2 weeks ago. She has not had any previous history of breast cancer. She had a benign biopsy of the left breast years ago. There is no family history of breast cancer. Her last mammogram was about 8 years ago. She talk to her primary physician and was referred immediately to the breast center. Diagnostic mammogram and ultrasound were performed which I've reviewed. This shows an approximately 5 cm spiculated mass directly behind the right nipple with some thickening of the areolar skin. Ultrasound confirmed a similar mass. Also noted was a large 2.5 cm level I right axillary lymph node. Subsequently core biopsy was performed of the mass and of the lymph node. 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 Her lymph node was positive for metastatic mammary carcinoma. She is referred for discussion of surgical treatment options. She otherwise has been feeling relatively well.  Past Medical History  Diagnosis Date  . Depression   . DVT (deep venous thrombosis)     hx of on HRT left leg  . GERD (gastroesophageal reflux disease)   . Hyperlipidemia   . Hypertension   . Hypothyroidism   . PPD positive, treated     rx inh   . OSA on CPAP   . Diabetes mellitus   . 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 Tubal ligation   . Cholecystectomy   . Foot surgery   . Eye surgery   . Cataract extraction   . Percutaneous liver biopsy   . Breast biopsy     left breast   Current Outpatient Prescriptions  Medication Sig Dispense Refill  .  ALPRAZolam (XANAX) 0.25 MG tablet Take 1 tablet (0.25 mg total) by mouth 3 (three) times daily as needed.  30 tablet  0  . amLODipine (NORVASC) 5 MG tablet TAKE 1 & 1/2 TABLETS BY MOUTH DAILY  45 tablet  5  . aspirin 81 MG tablet Take 81 mg by mouth daily.        [name] atorvastatin (LIPITOR) 10 MG tablet Take 1 tablet (10 mg total) by mouth daily.  90 tablet  1  . buPROPion (WELLBUTRIN SR) 150 MG 12 hr tablet TAKE 1 TABLET BY MOUTH TWICE DAILY  180 tablet  0  . escitalopram (LEXAPRO) 10 MG tablet Take 1 tablet (10 mg total) by mouth daily.  30 tablet  0  . furosemide (LASIX) 20 MG tablet TAKE 1 TABLET BY MOUTH ONCE DAILY AS DIRECTED  90 tablet  PRN  . glucose blood (TRUETEST TEST) test strip Use as instructed  100 each  12  . levothyroxine (SYNTHROID, LEVOTHROID) 125 MCG tablet Take 1 tablet (125 mcg total) by mouth daily.  90 tablet  1  . omeprazole (PRILOSEC) 20 MG capsule Take 1 capsule (20 mg total) by mouth 2 (two) times daily.  180 capsule  2  . saxagliptin HCl (ONGLYZA) 5 MG TABS tablet Take 1 tablet (5 mg total) by mouth daily.  30 tablet  5  . buPROPion [facility]  SR) 150 MG 12 hr tablet Take 150 mg by mouth daily.      . fluticasone (FLONASE) 50 MCG/ACT nasal spray Place 2 sprays into the nose daily.  16 g  3  . promethazine (PHENERGAN) 50 MG tablet Take 25 mg by mouth every 6 (six) hours as needed.      [name] telmisartan (MICARDIS) 40 MG tablet Take 1 tablet (40 mg total) by mouth daily.  30 tablet  11   Allergies  Allergen Reactions  . Olmesartan Medoxomil     REACTION: ? if cough  . Tetracycline Hcl     REACTION: unspecified  . Venlafaxine     REACTION: severe dry moouth     Review of Systems  Constitutional: Negative.   Respiratory: Negative.   Cardiovascular: Negative.   Gastrointestinal: Negative.   Musculoskeletal: Negative.        Objective:   Physical Exam BP 170/80  Pulse 88  Temp 98.7 F (37.1 C)  Resp 18  Ht 5\' 4"  (1.626 m)  Wt 210 lb (95.255 kg)  BMI 36.05  kg/m2 General: Alert, Obese Caucasian female, in no distress Skin: Warm and dry without rash or infection. HEENT: No palpable masses or thyromegaly. Sclera nonicteric. Pupils equal round and reactive. Oropharynx clear. Breasts: There is extensive bruising of the right breast post biopsy. There is however a definite firm approximately 5 cm mass directly behind the nipple areolar complex or the 12:00 position. It is freely movable. I cannot feel any palpable axillary lymph nodes. No other masses in either breast. Lymph nodes: No cervical, supraclavicular, or inguinal nodes palpable. Lungs: Breath sounds clear and equal without increased work of breathing Cardiovascular: Regular rate and rhythm without murmur. No JVD or edema. Peripheral pulses intact. Abdomen: Nondistended. Soft and nontender. No masses palpable. No organomegaly. No palpable hernias. Extremities: No edema or joint swelling or deformity. No chronic venous stasis changes. Neurologic: Alert and fully oriented. Gait normal.     Assessment:     New diagnosis of clinical T2 N1, stage II B. 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 We discussed treatment of her cancer in general and specifically surgical treatment options. She feels very strongly and had decided prior to this visit but she wants to proceed with mastectomy immediately. We discussed the options of neoadjuvant treatment to try to shrink the tumor preoperatively for breast conservation although I think this would be unlikely to be successful due to the retroareolar location. She however is firm in her decision to proceed with immediate surgery which I think is very reasonable. We discussed it with her positive lymph node we would plan axillary dissection with total mastectomy. We discussed that she would likely need postoperative chemotherapy and radiation therapy. The indication for surgery were discussed in detail and its nature and recovery as well as risks of  bleeding, infection, and anesthetic complications and risks of lymphedema. She has complete literature regarding treatment of breast cancer all her and her husband's questions were answered.    Plan:     Right modified mastectomy under general anesthesia with an overnight hospitalization.

## 2012-06-08 NOTE — Telephone Encounter (Signed)
Forms faxed

## 2012-06-09 ENCOUNTER — Encounter (HOSPITAL_COMMUNITY): Payer: Self-pay | Admitting: General Surgery

## 2012-06-09 LAB — CBC
Hemoglobin: 10.6 g/dL — ABNORMAL LOW (ref 12.0–15.0)
MCHC: 31.9 g/dL (ref 30.0–36.0)
Platelets: 131 10*3/uL — ABNORMAL LOW (ref 150–400)
RDW: 15.9 % — ABNORMAL HIGH (ref 11.5–15.5)

## 2012-06-09 LAB — BASIC METABOLIC PANEL
GFR calc Af Amer: >90 mL/min (ref >90)
GFR calc non Af Amer: >90 mL/min (ref >90)
Glucose, Bld: 149 mg/dL — ABNORMAL HIGH (ref 70–99)
Potassium: 3.7 mEq/L (ref 3.5–5.1)
Sodium: 139 mEq/L (ref 135–145)

## 2012-06-09 MED ORDER — OXYCODONE-ACETAMINOPHEN 5-325 MG PO TABS
1.0000 | ORAL_TABLET | ORAL | Status: DC | PRN
Start: 2012-06-09 — End: 2012-07-12

## 2012-06-09 NOTE — Telephone Encounter (Signed)
Opened in error

## 2012-06-09 NOTE — Discharge Summary (Signed)
   Patient ID: [name] [id number] 57 y.o. 12-Jul-1955  06/08/2012  Discharge date and time: 06/09/2012   Admitting Physician: Glenna Fellows T  Discharge Physician: Glenna Fellows T  Admission Diagnoses: cancer right breast  Discharge Diagnoses: same  Operations: Procedure(s): MASTECTOMY MODIFIED RADICAL INSERTION PORT-A-CATH  Admission Condition: good  Discharged Condition: good  Indication for Admission: patient is a 57 year old female with a recent diagnosis of T2 N1 invasive cancer of the right breast. After discussion regarding treatment options extensively detailed elsewhere she is electively admitted for right modified mastectomy and placement of Port-A-Cath.  Hospital Course: the patient was admitted on the morning of her procedure. She underwent an uneventful right modified mastectomy and placement of Port-A-Cath via the left subclavian vein. There were no complications. On the following morning she is comfortable with minimal pain medication. Vital signs are within normal limits. CBC is unremarkable. Her incision is clean and there is no unusual swelling or bleeding. JP drainage is appropriate and clearing. She is felt ready for discharge.   Disposition: Home  Patient Instructions:   [name], [name]  Home Medication Instructions [id number]   Printed on:06/09/12 1237  Medication Information                    aspirin 81 MG tablet Take 81 mg by mouth daily.             buPROPion (WELLBUTRIN SR) 150 MG 12 hr tablet Take 150 mg by mouth daily.           fluticasone (FLONASE) 50 MCG/ACT nasal spray Place 2 sprays into the nose daily.           atorvastatin (LIPITOR) 10 MG tablet Take 1 tablet (10 mg total) by mouth daily.           omeprazole (PRILOSEC) 20 MG capsule Take 1 capsule (20 mg total) by mouth 2 (two) times daily.           telmisartan (MICARDIS) 40 MG tablet Take 1 tablet (40 mg total) by mouth daily.           saxagliptin HCl  (ONGLYZA) 5 MG TABS tablet Take 1 tablet (5 mg total) by mouth daily.           escitalopram (LEXAPRO) 10 MG tablet Take 1 tablet (10 mg total) by mouth daily.           levothyroxine (SYNTHROID, LEVOTHROID) 125 MCG tablet Take 1 tablet (125 mcg total) by mouth daily.           glucose blood (TRUETEST TEST) test strip Use as instructed           amLODipine (NORVASC) 5 MG tablet TAKE 1 & 1/2 TABLETS BY MOUTH DAILY           ALPRAZolam (XANAX) 0.25 MG tablet Take 1 tablet (0.25 mg total) by mouth 3 (three) times daily as needed.           oxyCODONE-acetaminophen (PERCOCET/ROXICET) 5-325 MG per tablet Take 1-2 tablets by mouth every 4 (four) hours as needed.             Activity: activity as tolerated Diet: diabetic diet Wound Care: patient is instructed to empty JP drains twice daily and record the amount. Can shower in 3 days.  Follow-up:  With Dr. Johna Sheriff in 10 days.  Signed: Mariella Saa MD, FACS  06/09/2012, 12:37 PM

## 2012-06-11 ENCOUNTER — Telehealth (INDEPENDENT_AMBULATORY_CARE_PROVIDER_SITE_OTHER): Payer: Self-pay | Admitting: General Surgery

## 2012-06-11 NOTE — Telephone Encounter (Signed)
Called the patient and discussed path report  

## 2012-06-14 ENCOUNTER — Telehealth (INDEPENDENT_AMBULATORY_CARE_PROVIDER_SITE_OTHER): Payer: Self-pay | Admitting: General Surgery

## 2012-06-14 NOTE — Telephone Encounter (Signed)
Called patient back after call back was received by Dr. Johna Sheriff. Patient removed the dressing on her incision site and confirmed there was no sign of infection, no drainage. I asked the patient to place the back of her hand on the site and advise if it felt warmer that the surrounding skin, patient said no. I asked patient to watch for signs of infection (high fever that will not resolve, discharge, odor, etc) and to call if any occur. Advised the patient to make sure to bring her list of drainage with her when she comes to the office. Confirmed for the patient per Dr. Johna Sheriff that she can take the Zofran 4 mg she has at home for the nausea.  Patient asked if redness around the drain site is normal. I advised it was for their to be some redness, also it may be due to the suture in her skin that is helping hold the drain in place. Advised the patient if that area gets worse and the redness spreads to call our office. The patient agreed.

## 2012-06-14 NOTE — Telephone Encounter (Signed)
Also received paperwork for pt's husband to be completed for FMLA to help care for pt. Forms forwarded to Provider.

## 2012-06-14 NOTE — Telephone Encounter (Signed)
Received fax from [facility] for attending physician statement for short-term disability. Form forwarded to Provider for completion.

## 2012-06-14 NOTE — Telephone Encounter (Signed)
Patient called in saying she has been feeling nauseated, with diarrhea and temp of 99.4, all of which started this morning. Patient stated she has Zofran 4 mg (has 12 of them) and wanted to know if she can go ahead and take them or not. I advised I would forward a message to Dr. Johna Sheriff to advise and she would be called back with his answer. I asked the patient if she has been around anyone who has had the flu or stomach virus. Patient said not to her knowledge. I asked her to tell her friends/family that if they have the flu, stomach bug or just getting over it, to stay home and call her instead because she does not need exposure to anything that will make her feel worse or introduce bacteria that will affect healing. Patient agreed. Patient confirmed her call back was [phone].

## 2012-06-15 NOTE — Telephone Encounter (Signed)
Spoke with pt's husband.  Notified him that formes have been completed and we will mail his fmla to his home as he requested.

## 2012-06-16 NOTE — Telephone Encounter (Signed)
Form faxed to [facility] and additional forms mailed to pt's home as requested below.

## 2012-06-19 ENCOUNTER — Encounter: Payer: Self-pay | Admitting: *Deleted

## 2012-06-19 NOTE — Progress Notes (Signed)
Mailed after appt letter to pt. 

## 2012-06-21 ENCOUNTER — Other Ambulatory Visit: Payer: 59

## 2012-06-23 ENCOUNTER — Telehealth: Payer: Self-pay | Admitting: Family

## 2012-06-23 NOTE — Telephone Encounter (Signed)
Refill- escitalopram 10mg  tablet. Take one tablet by mouth once daily. Qty 90 last fill 5.20.13  Refill- onglysa 5mg  tablet. Take one tablet by mouth once daily. Qty 90 last fill 11.12.13

## 2012-06-25 ENCOUNTER — Encounter (INDEPENDENT_AMBULATORY_CARE_PROVIDER_SITE_OTHER): Payer: Commercial Managed Care - PPO | Admitting: General Surgery

## 2012-06-28 ENCOUNTER — Ambulatory Visit: Payer: 59 | Admitting: Internal Medicine

## 2012-06-28 NOTE — Telephone Encounter (Signed)
Patient is calling regarding this. She would like to be called when medication is refilled.

## 2012-06-29 ENCOUNTER — Ambulatory Visit (INDEPENDENT_AMBULATORY_CARE_PROVIDER_SITE_OTHER): Payer: No Typology Code available for payment source | Admitting: General Surgery

## 2012-06-29 ENCOUNTER — Encounter (INDEPENDENT_AMBULATORY_CARE_PROVIDER_SITE_OTHER): Payer: Commercial Managed Care - PPO | Admitting: General Surgery

## 2012-06-29 ENCOUNTER — Encounter (INDEPENDENT_AMBULATORY_CARE_PROVIDER_SITE_OTHER): Payer: Self-pay | Admitting: General Surgery

## 2012-06-29 VITALS — BP 162/90 | HR 127 | Temp 99.9°F | Resp 20 | Ht 64.0 in | Wt 197.0 lb

## 2012-06-29 DIAGNOSIS — C773 Secondary and unspecified malignant neoplasm of axilla and upper limb lymph nodes: Secondary | ICD-10-CM

## 2012-06-29 DIAGNOSIS — C50919 Malignant neoplasm of unspecified site of unspecified female breast: Secondary | ICD-10-CM

## 2012-06-29 MED ORDER — PROMETHAZINE HCL 12.5 MG PO TABS: 12.5000 mg | ORAL_TABLET | ORAL | Status: DC | PRN

## 2012-06-29 MED ORDER — ESCITALOPRAM OXALATE 10 MG PO TABS: 10.0000 mg | ORAL_TABLET | Freq: Every day | ORAL | Status: DC

## 2012-06-29 MED ORDER — SAXAGLIPTIN HCL 5 MG PO TABS: 5.0000 mg | ORAL_TABLET | Freq: Every day | ORAL | Status: DC

## 2012-06-29 NOTE — Progress Notes (Signed)
History: The patient returns for her first office visit approaching 3 weeks following right modified mastectomy for T2N1 1 cancer of the right breast.  She had Port-A-Cath placement at the same time. She generally has been getting along well although had some nausea and diarrhea and cramping stomach pain today. Her daughter has had similar symptoms. No problems related to her wound or drains. Drainage is less than 10 cc from each drain daily.  Exam: BP 162/90  Pulse 127  Temp(Src) 99.9 F (37.7 C)  Resp 20  Ht 5\' 4"  (1.626 m)  Wt 197 lb (89.359 kg)  BMI 33.8 kg/m2 General: Appears well Wound: Right mastectomy wound is well-healed the staples were removed and the wound Steri-Stripped. The drains are removed. Abdomen: Soft and nontender  Assessment and plan: Doing well following mastectomy without apparent complication. She will start her chemotherapy in about one week. I wrote a prescription for Phenergan for what sounds like a gastroenteritis she will contact her primary physician if this is not better in 24-48 hours. Return in one month.

## 2012-06-29 NOTE — Telephone Encounter (Signed)
Refills sent, pt aware  

## 2012-07-01 ENCOUNTER — Encounter: Payer: Self-pay | Admitting: Physician Assistant

## 2012-07-01 ENCOUNTER — Ambulatory Visit (HOSPITAL_BASED_OUTPATIENT_CLINIC_OR_DEPARTMENT_OTHER): Payer: 59 | Admitting: Physician Assistant

## 2012-07-01 ENCOUNTER — Encounter: Payer: Self-pay | Admitting: Oncology

## 2012-07-01 VITALS — BP 164/81 | HR 93 | Temp 97.9°F | Resp 20 | Ht 64.0 in | Wt 196.4 lb

## 2012-07-01 DIAGNOSIS — C773 Secondary and unspecified malignant neoplasm of axilla and upper limb lymph nodes: Secondary | ICD-10-CM

## 2012-07-01 DIAGNOSIS — C50019 Malignant neoplasm of nipple and areola, unspecified female breast: Secondary | ICD-10-CM

## 2012-07-01 DIAGNOSIS — C50919 Malignant neoplasm of unspecified site of unspecified female breast: Secondary | ICD-10-CM

## 2012-07-01 DIAGNOSIS — Z170 Estrogen receptor positive status [ER+]: Secondary | ICD-10-CM

## 2012-07-01 DIAGNOSIS — C50911 Malignant neoplasm of unspecified site of right female breast: Secondary | ICD-10-CM

## 2012-07-01 MED ORDER — PROCHLORPERAZINE MALEATE 10 MG PO TABS: ORAL_TABLET | ORAL | Status: DC

## 2012-07-01 MED ORDER — DEXAMETHASONE 4 MG PO TABS: ORAL_TABLET | ORAL | Status: DC

## 2012-07-01 MED ORDER — LIDOCAINE-PRILOCAINE 2.5-2.5 % EX CREA: TOPICAL_CREAM | CUTANEOUS | Status: DC | PRN

## 2012-07-01 MED ORDER — LORAZEPAM 0.5 MG PO TABS: ORAL_TABLET | ORAL | Status: DC

## 2012-07-01 NOTE — Progress Notes (Signed)
Enrolled pt in the [employer] program.  I faxed signed form and activated card today.  °

## 2012-07-01 NOTE — Progress Notes (Signed)
ID: [name]   DOB: 1955-08-13  MR#: [id number]  WUJ#:[id number]  PCP: Rhonda Fillers., NP GYN:  SU: [name] OTHER NF:AOZHYQ [name], [name]   HISTORY OF PRESENT ILLNESS: [name] noted a mass in her right breast mid December, and as it did not spontaneously resolve over a couple of weeks she brought it to her primary physician's attention. She was set up for diagnostic mammography and right breast ultrasonography at the [facility] Center 05/10/2012. (Note that the patient's most recent prior mammography had been in October 2008). The current study showed a spiculated mass in the superior subareolar portion of the right breast measuring approximately 5 cm and associated with pleomorphic calcifications. This was firm and palpable. There was right nipple retraction and skin thickening. Ultrasound confirmed an irregularly marginated hypoechoic mass measuring 3.5 cm by ultrasound, and an abnormal appearing lower right axillary lymph node measuring 2.6 cm.  Biopsies of both the breast mass and the abnormal appearing lymph node were performed 05/21/2012. Both showed an invasive ductal carcinoma, grade 2, with similar prognostic panels (the breast mass was 100% estrogen and 73% progesterone receptor positive, with an MIB-1 of 5%; the lymph node was 100% estrogen 100% progesterone receptor positive, with an MIB-1 of 20%). Both masses were HER-2 negative.  Breast MRI obtained at [name] 05/29/2012 confirmed a dominant mass in the retroareolar right breast measuring 4.4 cm maximally. There was a satellite nodule inferior and lateral to this mass, measuring 1.7 cm. There were no other masses in either breast. Aside from the previously biopsied lymph node there were other mildly enhancing level I right axillary lymph nodes which did not appear pathologic. There was no other lymphadenopathy noted. The patient's subsequent history is as detailed below.   INTERVAL HISTORY: [name]  returns today accompanied by her husband [name], for followup of her right breast carcinoma. Interval history is remarkable for [name] having undergone a right mastectomy under the care of [name] on 06/08/2012. She has recovered well from surgery, and is now ready to discuss adjuvant chemotherapy.  REVIEW OF SYSTEMS: Although [name] is anxious about her diagnosis and her tolerance of the treatment, physically she is doing quite well. She's had no fevers or chills. She has had no abnormal bleeding. She's eating and drinking well denies any nausea, emesis, or change in bowel or bladder habits. She has no cough, production, pleurisy, shortness of breath, or chest pain. She denies any unusual headaches, dizziness, or change in vision. Currently, she has no unusual myalgias, arthralgias, or bony pain. At baseline, she has no peripheral neuropathy. She's had no peripheral swelling.  A detailed review of systems is otherwise stable and noncontributory.   PAST MEDICAL HISTORY: Past Medical History  Diagnosis Date  . Depression   . DVT (deep venous thrombosis)     hx of on HRT left leg  . GERD (gastroesophageal reflux disease)   . Hyperlipidemia   . Hypertension   . Hypothyroidism   . PPD positive, treated     rx inh   . OSA on CPAP   . Diabetes mellitus   . 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 Cancer     PAST SURGICAL HISTORY: Past Surgical History  Procedure Laterality Date  . Tubal ligation    . Cholecystectomy    . Foot surgery    . Eye surgery    . Cataract extraction    . Percutaneous liver biopsy    . Breast  biopsy      left breast  . Mastectomy, radical  06/08/2012    RIGHT  . Mastectomy modified radical  06/08/2012    Procedure: MASTECTOMY MODIFIED RADICAL;  Surgeon: Rhonda Saa, MD;  Location: [facility] OR;  Service: General;  Laterality: Right;  . Portacath placement  06/08/2012    Procedure: INSERTION PORT-A-CATH;  Surgeon: Rhonda Saa, MD;   Location: [facility] OR;  Service: General;  Laterality: Left;    FAMILY HISTORY Family History  Problem Relation Age of Onset  . Diabetes Mother   . Hypertension Mother   . Arthritis Mother   . Heart disease Mother   . Heart failure Mother   . Other Mother     benign breast mass  . Stroke Father   . Heart disease Father   . Diabetes Paternal Grandmother   . Colon cancer Paternal Grandfather    the patient's father died in his 22s with a history of dementia. He had had prior strokes. The patient's mother died in her 75s, with a history of congestive heart failure. [name] had no brothers, one sister. There is no history of breast or ovarian cancer in the family.  GYNECOLOGIC HISTORY: Menarche age 49, first live birth age 32, she is GX P2, menopause approximately 15 years ago, on hormone replacement until 2010.  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 Her husband [name] works for [employer]. Daughter [name] is a Scientist, forensic and lives in [place]. Daughter [name] and her family (husband and 2 children aged 5 and 1-1/2 years) currently live with the patient. [name] is a member of a [employer].   ADVANCED DIRECTIVES: Not in place  HEALTH MAINTENANCE: History  Substance Use Topics  . Smoking status: Former Games developer  . Smokeless tobacco: Never Used  . Alcohol Use: No     Colonoscopy: Never  PAP: Does not recall  Bone density: Never  Lipid panel:  Allergies  Allergen Reactions  . Olmesartan Medoxomil     REACTION: ? if cough  . Tetracycline Hcl     REACTION: unspecified  . Venlafaxine     REACTION: severe dry moouth  . Adhesive (Tape) Rash    Current Outpatient Prescriptions  Medication Sig Dispense Refill  . ALPRAZolam (XANAX) 0.25 MG tablet Take 1 tablet (0.25 mg total) by mouth 3 (three) times daily as needed.  30 tablet  0  . amLODipine (NORVASC) 5 MG tablet TAKE 1 & 1/2 TABLETS BY MOUTH  DAILY  45 tablet  5  . aspirin 81 MG tablet Take 81 mg by mouth daily.        [name] atorvastatin (LIPITOR) 10 MG tablet Take 1 tablet (10 mg total) by mouth daily.  90 tablet  1  . buPROPion (WELLBUTRIN SR) 150 MG 12 hr tablet Take 150 mg by mouth daily.      [name] escitalopram (LEXAPRO) 10 MG tablet Take 1 tablet (10 mg total) by mouth daily.  90 tablet  1  . fluticasone (FLONASE) 50 MCG/ACT nasal spray Place 2 sprays into the nose daily.  16 g  3  . glucose blood (TRUETEST TEST) test strip Use as instructed  100 each  12  . levothyroxine (SYNTHROID, LEVOTHROID) 125 MCG tablet Take 1 tablet (125 mcg total) by mouth daily.  90 tablet  1  . omeprazole (PRILOSEC) 20 MG capsule Take 1 capsule (20 mg total) by mouth 2 (two) times daily.  180 capsule  2  . oxyCODONE-acetaminophen (PERCOCET/ROXICET) 5-325 MG per tablet Take 1-2 tablets by mouth every 4 (four) hours as needed.  50 tablet  0  . promethazine (PHENERGAN) 12.5 MG tablet Take 1 tablet (12.5 mg total) by mouth every 4 (four) hours as needed for nausea.  30 tablet  0  . saxagliptin HCl (ONGLYZA) 5 MG TABS tablet Take 1 tablet (5 mg total) by mouth daily.  90 tablet  1  . telmisartan (MICARDIS) 40 MG tablet Take 1 tablet (40 mg total) by mouth daily.  30 tablet  11  . dexamethasone (DECADRON) 4 MG tablet 2 tabs PO BID on day before and 2 days after chemo. 2 tabs PO in am only on day 4.  30 tablet  2  . lidocaine-prilocaine (EMLA) cream Apply topically as needed.  30 g  1  . LORazepam (ATIVAN) 0.5 MG tablet 1 tab up to twice daily PRN anxiety, insomnia and/or nausea  30 tablet  0  . prochlorperazine (COMPAZINE) 10 MG tablet 1 tab PO ACHS x 3 days after chemo, then 1 tab PO q 6 hr PRN nausea  30 tablet  2   No current facility-administered medications for this visit.    OBJECTIVE: Middle-aged white woman who appears anxious but in no acute distress Filed Vitals:   07/01/12 1510  BP: 164/81  Pulse: 93  Temp: 97.9 F (36.6 C)  Resp: 20     Body  mass index is 33.7 kg/(m^2).    ECOG FS: 0 Filed Weights   07/01/12 1510  Weight: 196 lb 6.4 oz (89.086 kg)    Patient was well oriented with anxious affect.  The remainder of the physical exam was deferred today.   LAB RESULTS: Lab Results  Component Value Date   WBC 6.7 06/09/2012   NEUTROABS 3.5 06/04/2012   HGB 10.6* 06/09/2012   HCT 33.2* 06/09/2012   MCV 80.2 06/09/2012   PLT 131* 06/09/2012      Chemistry      Component Value Date/Time   NA 139 06/09/2012 0445   NA 140 06/04/2012 1225   K 3.7 06/09/2012 0445   K 3.9 06/04/2012 1225   CL 104 06/09/2012 0445   CL 109* 06/04/2012 1225   CO2 25 06/09/2012 0445   CO2 23 06/04/2012 1225   BUN 9 06/09/2012 0445   BUN 7.1 06/04/2012 1225   CREATININE 0.54 06/09/2012 0445   CREATININE 0.6 06/04/2012 1225   CREATININE 0.62 05/03/2012 1407      Component Value Date/Time   CALCIUM 8.3* 06/09/2012 0445   CALCIUM 9.0 06/04/2012 1225   ALKPHOS 191* 06/04/2012 1225   ALKPHOS 156* 05/03/2012 1407   AST 44* 06/04/2012 1225   AST 57* 05/03/2012 1407   ALT 35 06/04/2012 1225   ALT 40* 05/03/2012 1407   BILITOT 0.59 06/04/2012 1225   BILITOT 0.8 05/03/2012 1407       Lab Results  Component Value Date   LABCA2 49* 06/04/2012    STUDIES: Dg Chest 2 View  06/03/2012  *RADIOLOGY REPORT*  Clinical Data: Preadmission respiratory films.  CHEST - 2 VIEW  Comparison: CT and plain films of the chest 11/07/2008.  Findings: There is cardiomegaly without edema.  Peribronchial thickening is noted.  No consolidative process, pneumothorax or effusion.  IMPRESSION: Cardiomegaly and chronic bronchitic change.  No acute abnormality.   Original Report Authenticated By: Holley Dexter, M.D.       Mr Breast Bilateral W Wo Contrast  05/31/2012  *  RADIOLOGY REPORT*  Clinical Data: The patient presented in December, 2013 with a palpable right breast lump.  Subsequent imaging demonstrated a suspicious mass and an abnormal right axillary lymph node.  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  BILATERAL BREAST MRI WITH AND WITHOUT CONTRAST  Technique: Multiplanar, multisequence MR images of both breasts were obtained prior to and following the intravenous administration of 20ml of Multihance.  Three dimensional images were evaluated at the independent DynaCad workstation.  Comparison:  No prior MR.  Mammography 05/21/2012, 05/10/2012, dating back to 03/05/2006.  Right breast ultrasound 05/10/2012.  Findings: Moderate to extensive background parenchymal enhancement. Dominant mass in the retroareolar right breast, extending backwards from the nipple, with approximate measurements of 4.4 (AP) x 2.9 (mediolateral) x 3.1 cm (superoinferior).  Blooming artifact from the biopsy clip along the superior margin of this dominant mass. Satellite nodule immediately inferior and lateral to the dominant mass measuring approximately 1.3 x 1.4 x 1.7 cm.  Each of these masses demonstrate a plateau enhancement pattern (type 2).  No abnormal enhancement in the left breast to suggest contralateral disease.  Enhancing approximate 2.0 cm level I right axillary lymph node corresponding to the previously biopsied node.  Other mildly enhancing level I right axillary lymph nodes which do not appear pathologic.  No lymphadenopathy elsewhere.  IMPRESSION:  1.  Dominant retroareolar right breast mass, biopsy-proven invasive ductal carcinoma and DCIS. 2.  Satellite nodule inferior and lateral to the dominant mass, immediately adjacent to the dominant mass. 3.  Pathologic right axillary lymph node, biopsy-proven metastasis. No pathologic lymphadenopathy elsewhere.  RECOMMENDATION: Definitive treatment of known right breast cancer.  Second look ultrasound for the satellite nodule is not felt necessary as it is immediately adjacent to the dominant mass and should be included in the treatment zone.  THREE-DIMENSIONAL  MR IMAGE RENDERING ON INDEPENDENT WORKSTATION:  Three-dimensional MR images were rendered by post-processing of the original MR data on an independent workstation.  The three- dimensional MR images were interpreted, and findings were reported in the accompanying complete MRI report for this study.  BI-RADS CATEGORY 6:  Known biopsy-proven malignancy - appropriate action should be taken.   Original Report Authenticated By: Hulan Saas, M.D.       ASSESSMENT: 57 y.o. [name]   (1)  status post right mastectomy under the care of [name] on 06/08/2012 for a 5.8 cm grade 2 invasive ductal carcinoma, ER and PR positive at 100%, HER-2/neu negative, with MIB-1 of 20%.   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 Lymphovascular and appearing neural invasion was identified. 2 of 18 lymph nodes were involved. Margins were clear. Pathologic staging pT3, pN1a, stage IIIA.  (Note the original clinical staging was IIB.)   (2)  being treated in the adjuvant setting, the goal being to complete 6 q. three-week doses of docetaxel/carboplatin beginning 07/06/12, with Neulasta on day 2 for granulocyte support  (3) patient will need postmastectomy radiation, which will be followed by antiestrogen therapy.  (4)  comorbidities include diabetes, hypertension, and chronic liver disease with cirrhosis and fatty liver.   PLAN: Our entire one-hour appointment today was spent counseling the patient regarding her diagnosis and treatment plan, and coordinating her care. Pathologic staging being a IIIa rather than a IIB, Dr. [name] feels it would be prudent to proceed with a PET scan prior to initiating [name]'s chemotherapy. We'll try to schedule this in the  next couple of days, prior to initiation of chemotherapy.  In addition, our original plan was to treat with 4 cycles of docetaxel/cyclophosphamide. Due to the change in staging, Dr. [name] have suggested changing her regimen to 6 cycles of q.  three-week docetaxel/carboplatin.  She is scheduled to return for her first cycle of adjuvant docetaxel/carboplatin next week on February 25. She will have her Neulasta injection on the 26th, and I'll see her the following week on March 4 process of chemotoxicity. In the future, she would like to move her treatments to Thursday, so we will operationalize that as well.  We reviewed [name]'s antinausea regimen which will include dexamethasone (2 tablets by mouth twice a day on the day before, in 2 days after chemotherapy, then 2 tablets by mouth only at breakfast on day 4). [name] does have a history of diabetes, and will follow her glucose very closely, checking the level on daily basis while she is undergoing chemotherapy. We'll keep a close eye on this, and if necessary will decrease her dose of dexamethasone in the future.  [name] will also be taking prochlorperazine, one tablet with meals and at bedtime for 3 days following treatment,then every 6 hours as needed for nausea. She typically takes Xanax, but I have asked her to stop the Xanax, and replace it with lorazepam until she completes chemotherapy. She'll take this at night to help her sleep, and also knows that helps with nausea. She was given prescriptions for all of these medications, plus EMLA cream. She was also given written instructions on how to utilize all of these medications appropriately.  She and her husband both voice understanding and agreement with this plan, and know to call with any changes or problems.  Incidentally the patient is noted to have a low MCV. We will send a ferritin within the next set of labs to assess this further.   Sible Straley    07/01/2012

## 2012-07-04 ENCOUNTER — Telehealth: Payer: Self-pay | Admitting: Oncology

## 2012-07-04 NOTE — Telephone Encounter (Signed)
appts adjusted as per 2/20 pof. Per pof pt will get new schedule at 2/25 visit. Also per pof appts moved to dates given per pt.

## 2012-07-05 ENCOUNTER — Ambulatory Visit (HOSPITAL_COMMUNITY)
Admission: RE | Admit: 2012-07-05 | Discharge: 2012-07-05 | Disposition: A | Discharge status: Home / Self Care | Payer: 59 | Source: Ambulatory Visit | Attending: Physician Assistant | Admitting: Physician Assistant

## 2012-07-05 ENCOUNTER — Encounter: Payer: Self-pay | Admitting: Family

## 2012-07-05 ENCOUNTER — Other Ambulatory Visit: Payer: Self-pay | Admitting: Physician Assistant

## 2012-07-05 ENCOUNTER — Ambulatory Visit (INDEPENDENT_AMBULATORY_CARE_PROVIDER_SITE_OTHER): Payer: 59 | Admitting: Family

## 2012-07-05 ENCOUNTER — Encounter: Payer: Self-pay | Admitting: Oncology

## 2012-07-05 VITALS — BP 142/70 | HR 86 | Temp 98.2°F | Resp 16 | Ht 66.0 in | Wt 201.1 lb

## 2012-07-05 DIAGNOSIS — C50919 Malignant neoplasm of unspecified site of unspecified female breast: Secondary | ICD-10-CM

## 2012-07-05 DIAGNOSIS — D259 Leiomyoma of uterus, unspecified: Secondary | ICD-10-CM | POA: Insufficient documentation

## 2012-07-05 DIAGNOSIS — R161 Splenomegaly, not elsewhere classified: Secondary | ICD-10-CM | POA: Insufficient documentation

## 2012-07-05 DIAGNOSIS — C50911 Malignant neoplasm of unspecified site of right female breast: Secondary | ICD-10-CM

## 2012-07-05 DIAGNOSIS — K7460 Unspecified cirrhosis of liver: Secondary | ICD-10-CM | POA: Insufficient documentation

## 2012-07-05 DIAGNOSIS — F329 Major depressive disorder, single episode, unspecified: Secondary | ICD-10-CM

## 2012-07-05 MED ORDER — FLUDEOXYGLUCOSE F - 18 (FDG) INJECTION
18.8000 | Freq: Once | INTRAVENOUS | Status: AC | PRN
Start: 2012-07-05 — End: 2012-07-05
  Administered 2012-07-05: 18.8 via INTRAVENOUS

## 2012-07-05 NOTE — Progress Notes (Signed)
Subjective:    Patient ID: [name], female    DOB: 1956-03-27, 57 y.o.   MRN: [id number]  HPI  [name] is a 57 yr old female who presents today for follow up.  Since her last visit she has had a right total mastectomy for breast mass which was noted to be positive for malignancy with lymph node involvement.  She reports that she has been healing well from her mastectomy and that she has no significant pain. She will start chemotherapy tomorrow which she is a bit nervous about.  Her chemo treatment will be followed by radiation.  Depression- reports that she is feeling better now that she has been able to get out of the house.   She continues the lexapro 10mg  and reports that her mood is well controlled.     Review of Systems    see HPI  Past Medical History  Diagnosis Date  . Depression   . DVT (deep venous thrombosis)     hx of on HRT left leg  . GERD (gastroesophageal reflux disease)   . Hyperlipidemia   . Hypertension   . Hypothyroidism   . PPD positive, treated     rx inh   . OSA on CPAP   . Diabetes mellitus   . 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 Cancer     History   Social History  . Marital Status: Married    Spouse Name: N/A    Number of Children: N/A  . Years of Education: N/A   Occupational History  . Not on file.   Social History Main Topics  . Smoking status: Former Games developer  . Smokeless tobacco: Never Used  . Alcohol Use: No  . Drug Use: No  . Sexually Active: Not on file   Other Topics Concern  . 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     Past Surgical History  Procedure Laterality Date  . Tubal ligation    . Cholecystectomy    . Foot surgery    . Eye surgery    . Cataract extraction    . Percutaneous liver biopsy    .  Breast biopsy      left breast  . Mastectomy, radical  06/08/2012    RIGHT  . Mastectomy modified radical  06/08/2012    Procedure: MASTECTOMY MODIFIED RADICAL;  Surgeon: Mariella Saa, MD;  Location: [facility] OR;  Service: General;  Laterality: Right;  . Portacath placement  06/08/2012    Procedure: INSERTION PORT-A-CATH;  Surgeon: Mariella Saa, MD;  Location: [facility] OR;  Service: General;  Laterality: Left;    Family History  Problem Relation Age of Onset  . Diabetes Mother   . Hypertension Mother   . Arthritis Mother   . Heart disease Mother   . Heart failure Mother   . Other Mother     benign breast mass  . Stroke Father   . Heart disease Father   . Diabetes Paternal Grandmother   . Colon cancer Paternal Grandfather     Allergies  Allergen Reactions  . Olmesartan Medoxomil     REACTION: ? if cough  . Tetracycline Hcl  REACTION: unspecified  . Venlafaxine     REACTION: severe dry moouth  . Adhesive (Tape) Rash    Current Outpatient Prescriptions on File Prior to Visit  Medication Sig Dispense Refill  . ALPRAZolam (XANAX) 0.25 MG tablet Take 1 tablet (0.25 mg total) by mouth 3 (three) times daily as needed.  30 tablet  0  . amLODipine (NORVASC) 5 MG tablet TAKE 1 & 1/2 TABLETS BY MOUTH DAILY  45 tablet  5  . aspirin 81 MG tablet Take 81 mg by mouth daily.        [name] atorvastatin (LIPITOR) 10 MG tablet Take 1 tablet (10 mg total) by mouth daily.  90 tablet  1  . buPROPion (WELLBUTRIN SR) 150 MG 12 hr tablet Take 150 mg by mouth daily.      [name] dexamethasone (DECADRON) 4 MG tablet 2 tabs PO BID on day before and 2 days after chemo. 2 tabs PO in am only on day 4.  30 tablet  2  . escitalopram (LEXAPRO) 10 MG tablet Take 1 tablet (10 mg total) by mouth daily.  90 tablet  1  . fluticasone (FLONASE) 50 MCG/ACT nasal spray Place 2 sprays into the nose daily.  16 g  3  . glucose blood (TRUETEST TEST) test strip Use as instructed  100 each  12  . levothyroxine (SYNTHROID,  LEVOTHROID) 125 MCG tablet Take 1 tablet (125 mcg total) by mouth daily.  90 tablet  1  . lidocaine-prilocaine (EMLA) cream Apply topically as needed.  30 g  1  . LORazepam (ATIVAN) 0.5 MG tablet 1 tab up to twice daily PRN anxiety, insomnia and/or nausea  30 tablet  0  . omeprazole (PRILOSEC) 20 MG capsule Take 1 capsule (20 mg total) by mouth 2 (two) times daily.  180 capsule  2  . oxyCODONE-acetaminophen (PERCOCET/ROXICET) 5-325 MG per tablet Take 1-2 tablets by mouth every 4 (four) hours as needed.  50 tablet  0  . prochlorperazine (COMPAZINE) 10 MG tablet 1 tab PO ACHS x 3 days after chemo, then 1 tab PO q 6 hr PRN nausea  30 tablet  2  . promethazine (PHENERGAN) 12.5 MG tablet Take 1 tablet (12.5 mg total) by mouth every 4 (four) hours as needed for nausea.  30 tablet  0  . saxagliptin HCl (ONGLYZA) 5 MG TABS tablet Take 1 tablet (5 mg total) by mouth daily.  90 tablet  1  . telmisartan (MICARDIS) 40 MG tablet Take 1 tablet (40 mg total) by mouth daily.  30 tablet  11   No current facility-administered medications on file prior to visit.    BP 142/70  Pulse 86  Temp(Src) 98.2 F (36.8 C) (Oral)  Resp 16  Ht 5\' 6"  (1.676 m)  Wt 201 lb 1.9 oz (91.227 kg)  BMI 32.48 kg/m2  SpO2 98%    Objective:   Physical Exam  Constitutional: She is oriented to person, place, and time. She appears well-developed and well-nourished. No distress.  Cardiovascular: Normal rate and regular rhythm.   No murmur heard. Pulmonary/Chest: Effort normal and breath sounds normal. No respiratory distress. She has no wheezes. She has no rales. She exhibits no tenderness.  Neurological: She is alert and oriented to person, place, and time.  Psychiatric: She has a normal mood and affect. Her behavior is normal. Judgment and thought content normal.          Assessment & Plan:

## 2012-07-05 NOTE — Patient Instructions (Addendum)
Please follow up in 2 months. Good luck with your upcoming treatments.

## 2012-07-05 NOTE — Assessment & Plan Note (Signed)
Well controlled on lexapro. Continue same.  She seems to be in good spirits for what she has been through recently.

## 2012-07-05 NOTE — Assessment & Plan Note (Signed)
She is staged at IIB (T2, N1, cM0). Management per oncology.

## 2012-07-06 ENCOUNTER — Other Ambulatory Visit: Payer: Self-pay | Admitting: Oncology

## 2012-07-06 ENCOUNTER — Ambulatory Visit (HOSPITAL_BASED_OUTPATIENT_CLINIC_OR_DEPARTMENT_OTHER): Payer: 59

## 2012-07-06 ENCOUNTER — Encounter: Payer: Self-pay | Admitting: Oncology

## 2012-07-06 ENCOUNTER — Other Ambulatory Visit (HOSPITAL_BASED_OUTPATIENT_CLINIC_OR_DEPARTMENT_OTHER): Payer: 59 | Admitting: Lab

## 2012-07-06 VITALS — BP 151/72 | HR 87 | Temp 98.3°F | Resp 20

## 2012-07-06 DIAGNOSIS — C50919 Malignant neoplasm of unspecified site of unspecified female breast: Secondary | ICD-10-CM

## 2012-07-06 DIAGNOSIS — C773 Secondary and unspecified malignant neoplasm of axilla and upper limb lymph nodes: Secondary | ICD-10-CM

## 2012-07-06 DIAGNOSIS — Z170 Estrogen receptor positive status [ER+]: Secondary | ICD-10-CM

## 2012-07-06 DIAGNOSIS — C50119 Malignant neoplasm of central portion of unspecified female breast: Secondary | ICD-10-CM

## 2012-07-06 DIAGNOSIS — Z5111 Encounter for antineoplastic chemotherapy: Secondary | ICD-10-CM

## 2012-07-06 DIAGNOSIS — C50911 Malignant neoplasm of unspecified site of right female breast: Secondary | ICD-10-CM

## 2012-07-06 DIAGNOSIS — C50019 Malignant neoplasm of nipple and areola, unspecified female breast: Secondary | ICD-10-CM

## 2012-07-06 LAB — CBC WITH DIFFERENTIAL/PLATELET
BASO%: 0.2 % (ref 0.0–2.0)
Basophils Absolute: 0 10*3/uL (ref 0.0–0.1)
HCT: 35.4 % (ref 34.8–46.6)
HGB: 11.3 g/dL — ABNORMAL LOW (ref 11.6–15.9)
MONO#: 0.4 10*3/uL (ref 0.1–0.9)
NEUT%: 79.1 % — ABNORMAL HIGH (ref 38.4–76.8)
RDW: 15 % — ABNORMAL HIGH (ref 11.2–14.5)
WBC: 5.7 10*3/uL (ref 3.9–10.3)
lymph#: 0.8 10*3/uL — ABNORMAL LOW (ref 0.9–3.3)

## 2012-07-06 LAB — FERRITIN: Ferritin: 13 ng/mL (ref 10–291)

## 2012-07-06 LAB — COMPREHENSIVE METABOLIC PANEL (CC13)
AST: 32 U/L (ref 5–34)
Albumin: 3.2 g/dL — ABNORMAL LOW (ref 3.5–5.0)
BUN: 8.5 mg/dL (ref 7.0–26.0)
Calcium: 8.9 mg/dL (ref 8.4–10.4)
Chloride: 108 mEq/L — ABNORMAL HIGH (ref 98–107)
Glucose: 185 mg/dl — ABNORMAL HIGH (ref 70–99)
Potassium: 3.8 mEq/L (ref 3.5–5.1)
Sodium: 139 mEq/L (ref 136–145)
Total Protein: 7 g/dL (ref 6.4–8.3)

## 2012-07-06 LAB — GLUCOSE, CAPILLARY: Glucose-Capillary: 100 mg/dL — ABNORMAL HIGH (ref 70–99)

## 2012-07-06 MED ORDER — SODIUM CHLORIDE 0.9 % IV SOLN
741.0000 mg | Freq: Once | INTRAVENOUS | Status: AC
  Administered 2012-07-06: 740 mg via INTRAVENOUS
  Filled 2012-07-06: qty 74

## 2012-07-06 MED ORDER — ONDANSETRON 16 MG/50ML IVPB (CHCC)
16.0000 mg | Freq: Once | INTRAVENOUS | Status: AC
  Administered 2012-07-06: 16 mg via INTRAVENOUS

## 2012-07-06 MED ORDER — DOCETAXEL CHEMO INJECTION 160 MG/16ML
75.0000 mg/m2 | Freq: Once | INTRAVENOUS | Status: AC
  Administered 2012-07-06: 150 mg via INTRAVENOUS
  Filled 2012-07-06: qty 15

## 2012-07-06 MED ORDER — SODIUM CHLORIDE 0.9 % IV SOLN
Freq: Once | INTRAVENOUS | Status: AC
  Administered 2012-07-06: 10:00:00 via INTRAVENOUS

## 2012-07-06 MED ORDER — DEXAMETHASONE SODIUM PHOSPHATE 4 MG/ML IJ SOLN
20.0000 mg | Freq: Once | INTRAMUSCULAR | Status: AC
  Administered 2012-07-06: 20 mg via INTRAVENOUS

## 2012-07-06 MED ORDER — SODIUM CHLORIDE 0.9 % IJ SOLN
10.0000 mL | INTRAMUSCULAR | Status: DC | PRN
  Administered 2012-07-06: 10 mL
  Filled 2012-07-06: qty 10

## 2012-07-06 MED ORDER — HEPARIN SOD (PORK) LOCK FLUSH 100 UNIT/ML IV SOLN
500.0000 [IU] | Freq: Once | INTRAVENOUS | Status: AC | PRN
  Administered 2012-07-06: 500 [IU]
  Filled 2012-07-06: qty 5

## 2012-07-06 NOTE — Patient Instructions (Addendum)
[facility] Discharge Instructions for Patients Receiving Chemotherapy  Today you received the following chemotherapy agents Taxotere/Carboplatin  To help prevent nausea and vomiting after your treatment, we encourage you to take your nausea medication as prescribed.   If you develop nausea and vomiting that is not controlled by your nausea medication, call the clinic. If it is after clinic hours your family physician or the after hours number for the clinic or go to the Emergency Department.   BELOW ARE SYMPTOMS THAT SHOULD BE REPORTED IMMEDIATELY:  *FEVER GREATER THAN 100.5 F  *CHILLS WITH OR WITHOUT FEVER  NAUSEA AND VOMITING THAT IS NOT CONTROLLED WITH YOUR NAUSEA MEDICATION  *UNUSUAL SHORTNESS OF BREATH  *UNUSUAL BRUISING OR BLEEDING  TENDERNESS IN MOUTH AND THROAT WITH OR WITHOUT PRESENCE OF ULCERS  *URINARY PROBLEMS  *BOWEL PROBLEMS  UNUSUAL RASH Items with * indicate a potential emergency and should be followed up as soon as possible.  One of the nurses will contact you 24 hours after your treatment. Please let the nurse know about any problems that you may have experienced. Feel free to call the clinic you have any questions or concerns. The clinic phone number is [phone].   I have been informed and understand all the instructions given to me. I know to contact the clinic, my physician, or go to the Emergency Department if any problems should occur. I do not have any questions at this time, but understand that I may call the clinic during office hours   should I have any questions or need assistance in obtaining follow up care.    __________________________________________  _____________  __________ Signature of Patient or Authorized Representative            Date                   Time    __________________________________________ Nurse's Signature

## 2012-07-07 ENCOUNTER — Ambulatory Visit (HOSPITAL_BASED_OUTPATIENT_CLINIC_OR_DEPARTMENT_OTHER): Payer: 59

## 2012-07-07 ENCOUNTER — Telehealth: Payer: Self-pay | Admitting: *Deleted

## 2012-07-07 VITALS — BP 146/74 | HR 99 | Temp 96.6°F

## 2012-07-07 DIAGNOSIS — C50019 Malignant neoplasm of nipple and areola, unspecified female breast: Secondary | ICD-10-CM

## 2012-07-07 DIAGNOSIS — C50911 Malignant neoplasm of unspecified site of right female breast: Secondary | ICD-10-CM

## 2012-07-07 MED ORDER — PEGFILGRASTIM INJECTION 6 MG/0.6ML
6.0000 mg | Freq: Once | SUBCUTANEOUS | Status: AC
  Administered 2012-07-07: 6 mg via SUBCUTANEOUS
  Filled 2012-07-07: qty 0.6

## 2012-07-07 NOTE — Telephone Encounter (Signed)
[name] here for Neulasta injection following 1st carbo/taxot chemo treatment.  States that she is doing good.  No nausea, vomiting, or diarrhea.   Encouraged to drink more fluids.  States she does have a bad taste in her mouth.  Knows to call the office at any time for problems or questions.

## 2012-07-08 ENCOUNTER — Encounter: Payer: Self-pay | Admitting: Oncology

## 2012-07-10 ENCOUNTER — Other Ambulatory Visit: Payer: Self-pay | Admitting: Oncology

## 2012-07-12 ENCOUNTER — Telehealth: Payer: Self-pay | Admitting: *Deleted

## 2012-07-12 ENCOUNTER — Inpatient Hospital Stay (HOSPITAL_COMMUNITY)
Admission: EM | Admit: 2012-07-12 | Discharge: 2012-07-21 | DRG: 808 | Disposition: A | Discharge status: Home / Self Care | Payer: 59 | Attending: Internal Medicine | Admitting: Internal Medicine

## 2012-07-12 ENCOUNTER — Emergency Department (HOSPITAL_COMMUNITY): Payer: 59

## 2012-07-12 ENCOUNTER — Encounter (HOSPITAL_COMMUNITY): Payer: Self-pay | Admitting: *Deleted

## 2012-07-12 DIAGNOSIS — I4729 Other ventricular tachycardia: Secondary | ICD-10-CM | POA: Diagnosis not present

## 2012-07-12 DIAGNOSIS — N179 Acute kidney failure, unspecified: Secondary | ICD-10-CM | POA: Diagnosis present

## 2012-07-12 DIAGNOSIS — E871 Hypo-osmolality and hyponatremia: Secondary | ICD-10-CM | POA: Diagnosis present

## 2012-07-12 DIAGNOSIS — R5081 Fever presenting with conditions classified elsewhere: Secondary | ICD-10-CM | POA: Diagnosis present

## 2012-07-12 DIAGNOSIS — R42 Dizziness and giddiness: Secondary | ICD-10-CM

## 2012-07-12 DIAGNOSIS — I10 Essential (primary) hypertension: Secondary | ICD-10-CM | POA: Diagnosis present

## 2012-07-12 DIAGNOSIS — R0981 Nasal congestion: Secondary | ICD-10-CM

## 2012-07-12 DIAGNOSIS — E876 Hypokalemia: Secondary | ICD-10-CM | POA: Diagnosis present

## 2012-07-12 DIAGNOSIS — E43 Unspecified severe protein-calorie malnutrition: Secondary | ICD-10-CM | POA: Diagnosis present

## 2012-07-12 DIAGNOSIS — T451X5A Adverse effect of antineoplastic and immunosuppressive drugs, initial encounter: Secondary | ICD-10-CM | POA: Diagnosis present

## 2012-07-12 DIAGNOSIS — I959 Hypotension, unspecified: Secondary | ICD-10-CM | POA: Diagnosis not present

## 2012-07-12 DIAGNOSIS — E119 Type 2 diabetes mellitus without complications: Secondary | ICD-10-CM | POA: Diagnosis present

## 2012-07-12 DIAGNOSIS — D61818 Other pancytopenia: Secondary | ICD-10-CM

## 2012-07-12 DIAGNOSIS — E860 Dehydration: Secondary | ICD-10-CM

## 2012-07-12 DIAGNOSIS — G4733 Obstructive sleep apnea (adult) (pediatric): Secondary | ICD-10-CM | POA: Diagnosis present

## 2012-07-12 DIAGNOSIS — C50919 Malignant neoplasm of unspecified site of unspecified female breast: Secondary | ICD-10-CM | POA: Diagnosis present

## 2012-07-12 DIAGNOSIS — Z86718 Personal history of other venous thrombosis and embolism: Secondary | ICD-10-CM

## 2012-07-12 DIAGNOSIS — Z9221 Personal history of antineoplastic chemotherapy: Secondary | ICD-10-CM

## 2012-07-12 DIAGNOSIS — E039 Hypothyroidism, unspecified: Secondary | ICD-10-CM | POA: Diagnosis present

## 2012-07-12 DIAGNOSIS — I472 Ventricular tachycardia, unspecified: Secondary | ICD-10-CM | POA: Diagnosis not present

## 2012-07-12 DIAGNOSIS — D509 Iron deficiency anemia, unspecified: Secondary | ICD-10-CM | POA: Diagnosis present

## 2012-07-12 DIAGNOSIS — Z79899 Other long term (current) drug therapy: Secondary | ICD-10-CM

## 2012-07-12 DIAGNOSIS — F430 Acute stress reaction: Secondary | ICD-10-CM

## 2012-07-12 DIAGNOSIS — K219 Gastro-esophageal reflux disease without esophagitis: Secondary | ICD-10-CM | POA: Diagnosis present

## 2012-07-12 DIAGNOSIS — Z6833 Body mass index (BMI) 33.0-33.9, adult: Secondary | ICD-10-CM

## 2012-07-12 DIAGNOSIS — K7460 Unspecified cirrhosis of liver: Secondary | ICD-10-CM | POA: Diagnosis present

## 2012-07-12 DIAGNOSIS — C50911 Malignant neoplasm of unspecified site of right female breast: Secondary | ICD-10-CM

## 2012-07-12 DIAGNOSIS — F329 Major depressive disorder, single episode, unspecified: Secondary | ICD-10-CM

## 2012-07-12 DIAGNOSIS — R161 Splenomegaly, not elsewhere classified: Secondary | ICD-10-CM

## 2012-07-12 DIAGNOSIS — F3289 Other specified depressive episodes: Secondary | ICD-10-CM | POA: Diagnosis present

## 2012-07-12 DIAGNOSIS — E669 Obesity, unspecified: Secondary | ICD-10-CM | POA: Diagnosis present

## 2012-07-12 DIAGNOSIS — K769 Liver disease, unspecified: Secondary | ICD-10-CM

## 2012-07-12 DIAGNOSIS — R197 Diarrhea, unspecified: Secondary | ICD-10-CM | POA: Diagnosis present

## 2012-07-12 DIAGNOSIS — D696 Thrombocytopenia, unspecified: Secondary | ICD-10-CM | POA: Diagnosis present

## 2012-07-12 DIAGNOSIS — K7689 Other specified diseases of liver: Secondary | ICD-10-CM | POA: Diagnosis present

## 2012-07-12 DIAGNOSIS — J309 Allergic rhinitis, unspecified: Secondary | ICD-10-CM

## 2012-07-12 DIAGNOSIS — R5381 Other malaise: Secondary | ICD-10-CM

## 2012-07-12 DIAGNOSIS — C779 Secondary and unspecified malignant neoplasm of lymph node, unspecified: Secondary | ICD-10-CM | POA: Diagnosis present

## 2012-07-12 DIAGNOSIS — E559 Vitamin D deficiency, unspecified: Secondary | ICD-10-CM

## 2012-07-12 DIAGNOSIS — I4891 Unspecified atrial fibrillation: Secondary | ICD-10-CM | POA: Diagnosis present

## 2012-07-12 DIAGNOSIS — Z9010 Acquired absence of unspecified breast and nipple: Secondary | ICD-10-CM

## 2012-07-12 DIAGNOSIS — D709 Neutropenia, unspecified: Secondary | ICD-10-CM | POA: Diagnosis present

## 2012-07-12 DIAGNOSIS — M129 Arthropathy, unspecified: Secondary | ICD-10-CM

## 2012-07-12 DIAGNOSIS — R945 Abnormal results of liver function studies: Secondary | ICD-10-CM

## 2012-07-12 DIAGNOSIS — D702 Other drug-induced agranulocytosis: Principal | ICD-10-CM | POA: Diagnosis present

## 2012-07-12 DIAGNOSIS — N170 Acute kidney failure with tubular necrosis: Secondary | ICD-10-CM | POA: Diagnosis present

## 2012-07-12 DIAGNOSIS — D61810 Antineoplastic chemotherapy induced pancytopenia: Secondary | ICD-10-CM | POA: Diagnosis present

## 2012-07-12 DIAGNOSIS — R7309 Other abnormal glucose: Secondary | ICD-10-CM

## 2012-07-12 DIAGNOSIS — F32A Depression, unspecified: Secondary | ICD-10-CM | POA: Diagnosis present

## 2012-07-12 DIAGNOSIS — E785 Hyperlipidemia, unspecified: Secondary | ICD-10-CM | POA: Diagnosis present

## 2012-07-12 HISTORY — DX: Malignant neoplasm of unspecified site of unspecified female breast: C50.919

## 2012-07-12 LAB — CBC WITH DIFFERENTIAL/PLATELET
Eosinophils Absolute: 0 10*3/uL (ref 0.0–0.7)
Hemoglobin: 11.7 g/dL — ABNORMAL LOW (ref 12.0–15.0)
Lymphocytes Relative: 0 % — ABNORMAL LOW (ref 12–46)
MCHC: 32.8 g/dL (ref 30.0–36.0)
Monocytes Absolute: 0 10*3/uL — ABNORMAL LOW (ref 0.1–1.0)
Neutrophils Relative %: 0 % — ABNORMAL LOW (ref 43–77)
RDW: 14.4 % (ref 11.5–15.5)
WBC: 0.8 10*3/uL — CL (ref 4.0–10.5)

## 2012-07-12 LAB — GLUCOSE, CAPILLARY

## 2012-07-12 LAB — COMPREHENSIVE METABOLIC PANEL
AST: 35 U/L (ref 0–37)
Albumin: 3.2 g/dL — ABNORMAL LOW (ref 3.5–5.2)
Calcium: 8.3 mg/dL — ABNORMAL LOW (ref 8.4–10.5)
Creatinine, Ser: 0.51 mg/dL (ref 0.50–1.10)
Total Protein: 6.4 g/dL (ref 6.0–8.3)

## 2012-07-12 MED ORDER — SODIUM CHLORIDE 0.9 % IV BOLUS (SEPSIS)
1250.0000 mL | Freq: Once | INTRAVENOUS | Status: AC
  Administered 2012-07-12: 1250 mL via INTRAVENOUS

## 2012-07-12 MED ORDER — PIPERACILLIN-TAZOBACTAM 3.375 G IVPB 30 MIN
3.3750 g | Freq: Once | INTRAVENOUS | Status: AC
  Administered 2012-07-12: 3.375 g via INTRAVENOUS
  Filled 2012-07-12: qty 50

## 2012-07-12 MED ORDER — INSULIN ASPART 100 UNIT/ML ~~LOC~~ SOLN
0.0000 [IU] | Freq: Three times a day (TID) | SUBCUTANEOUS | Status: DC
  Administered 2012-07-13 – 2012-07-14 (×3): 1 [IU] via SUBCUTANEOUS
  Administered 2012-07-14: 3 [IU] via SUBCUTANEOUS
  Administered 2012-07-15 – 2012-07-20 (×5): 1 [IU] via SUBCUTANEOUS

## 2012-07-12 MED ORDER — ONDANSETRON HCL 4 MG/2ML IJ SOLN: 4.0000 mg | Freq: Four times a day (QID) | INTRAMUSCULAR | Status: DC | PRN

## 2012-07-12 MED ORDER — VANCOMYCIN HCL IN DEXTROSE 1-5 GM/200ML-% IV SOLN
1000.0000 mg | Freq: Once | INTRAVENOUS | Status: AC
  Administered 2012-07-12: 1000 mg via INTRAVENOUS
  Filled 2012-07-12: qty 200

## 2012-07-12 MED ORDER — SODIUM CHLORIDE 0.9 % IV SOLN
Freq: Once | INTRAVENOUS | Status: AC
  Administered 2012-07-12: 12:00:00 via INTRAVENOUS

## 2012-07-12 MED ORDER — ALUM & MAG HYDROXIDE-SIMETH 200-200-20 MG/5ML PO SUSP: 30.0000 mL | Freq: Four times a day (QID) | ORAL | Status: DC | PRN

## 2012-07-12 MED ORDER — POTASSIUM CHLORIDE IN NACL 40-0.9 MEQ/L-% IV SOLN
INTRAVENOUS | Status: DC
  Administered 2012-07-12 – 2012-07-16 (×6): via INTRAVENOUS
  Filled 2012-07-12 (×10): qty 1000

## 2012-07-12 MED ORDER — ACETAMINOPHEN 650 MG RE SUPP: 650.0000 mg | Freq: Four times a day (QID) | RECTAL | Status: DC | PRN

## 2012-07-12 MED ORDER — GUAIFENESIN-DM 100-10 MG/5ML PO SYRP: 5.0000 mL | ORAL_SOLUTION | ORAL | Status: DC | PRN

## 2012-07-12 MED ORDER — ESCITALOPRAM OXALATE 10 MG PO TABS
10.0000 mg | ORAL_TABLET | Freq: Every day | ORAL | Status: DC
  Administered 2012-07-12 – 2012-07-20 (×9): 10 mg via ORAL
  Filled 2012-07-12 (×10): qty 1

## 2012-07-12 MED ORDER — ONDANSETRON HCL 4 MG PO TABS: 4.0000 mg | ORAL_TABLET | Freq: Four times a day (QID) | ORAL | Status: DC | PRN

## 2012-07-12 MED ORDER — ACETAMINOPHEN 325 MG PO TABS
650.0000 mg | ORAL_TABLET | Freq: Four times a day (QID) | ORAL | Status: DC | PRN
  Administered 2012-07-12: 650 mg via ORAL
  Filled 2012-07-12: qty 2

## 2012-07-12 MED ORDER — BUPROPION HCL ER (SR) 150 MG PO TB12
150.0000 mg | ORAL_TABLET | Freq: Every morning | ORAL | Status: DC
  Administered 2012-07-13 – 2012-07-20 (×8): 150 mg via ORAL
  Filled 2012-07-12 (×9): qty 1

## 2012-07-12 MED ORDER — AMLODIPINE BESYLATE 5 MG PO TABS
5.0000 mg | ORAL_TABLET | Freq: Every morning | ORAL | Status: DC
  Administered 2012-07-13: 5 mg via ORAL
  Filled 2012-07-12: qty 1

## 2012-07-12 MED ORDER — OXYCODONE HCL 5 MG PO TABS
5.0000 mg | ORAL_TABLET | ORAL | Status: DC | PRN
  Filled 2012-07-12: qty 2

## 2012-07-12 MED ORDER — FILGRASTIM 480 MCG/1.6ML IJ SOLN
480.0000 ug | Freq: Every day | INTRAMUSCULAR | Status: DC
  Administered 2012-07-12: 480 ug via SUBCUTANEOUS
  Filled 2012-07-12 (×2): qty 1.6

## 2012-07-12 MED ORDER — ATORVASTATIN CALCIUM 10 MG PO TABS
10.0000 mg | ORAL_TABLET | Freq: Every day | ORAL | Status: DC
  Administered 2012-07-12 – 2012-07-17 (×6): 10 mg via ORAL
  Filled 2012-07-12 (×7): qty 1

## 2012-07-12 MED ORDER — VANCOMYCIN HCL IN DEXTROSE 1-5 GM/200ML-% IV SOLN
1000.0000 mg | Freq: Three times a day (TID) | INTRAVENOUS | Status: DC
  Administered 2012-07-12 – 2012-07-15 (×8): 1000 mg via INTRAVENOUS
  Filled 2012-07-12 (×9): qty 200

## 2012-07-12 MED ORDER — PIPERACILLIN-TAZOBACTAM 3.375 G IVPB 30 MIN
3.3750 g | Freq: Once | INTRAVENOUS | Status: DC
  Filled 2012-07-12: qty 50

## 2012-07-12 MED ORDER — ASPIRIN EC 81 MG PO TBEC
81.0000 mg | DELAYED_RELEASE_TABLET | Freq: Every morning | ORAL | Status: DC
  Administered 2012-07-13 – 2012-07-18 (×6): 81 mg via ORAL
  Filled 2012-07-12 (×7): qty 1

## 2012-07-12 MED ORDER — PIPERACILLIN-TAZOBACTAM 3.375 G IVPB
3.3750 g | Freq: Three times a day (TID) | INTRAVENOUS | Status: DC
  Administered 2012-07-12 – 2012-07-15 (×8): 3.375 g via INTRAVENOUS
  Filled 2012-07-12 (×10): qty 50

## 2012-07-12 MED ORDER — LORAZEPAM 1 MG PO TABS: 1.0000 mg | ORAL_TABLET | Freq: Two times a day (BID) | ORAL | Status: DC | PRN

## 2012-07-12 MED ORDER — LINAGLIPTIN 5 MG PO TABS
5.0000 mg | ORAL_TABLET | Freq: Every day | ORAL | Status: DC
  Administered 2012-07-12 – 2012-07-20 (×9): 5 mg via ORAL
  Filled 2012-07-12 (×10): qty 1

## 2012-07-12 MED ORDER — PANTOPRAZOLE SODIUM 40 MG PO TBEC
40.0000 mg | DELAYED_RELEASE_TABLET | Freq: Every day | ORAL | Status: DC
  Administered 2012-07-12 – 2012-07-20 (×9): 40 mg via ORAL
  Filled 2012-07-12 (×12): qty 1

## 2012-07-12 MED ORDER — ENOXAPARIN SODIUM 40 MG/0.4ML ~~LOC~~ SOLN
40.0000 mg | SUBCUTANEOUS | Status: DC
  Administered 2012-07-12 – 2012-07-13 (×2): 40 mg via SUBCUTANEOUS
  Filled 2012-07-12 (×3): qty 0.4

## 2012-07-12 MED ORDER — LEVOTHYROXINE SODIUM 125 MCG PO TABS
125.0000 ug | ORAL_TABLET | Freq: Every day | ORAL | Status: DC
  Administered 2012-07-13 – 2012-07-21 (×9): 125 ug via ORAL
  Filled 2012-07-12 (×11): qty 1

## 2012-07-12 NOTE — Telephone Encounter (Signed)
Patient called reporting having diarrhea that started Friday morning.  On call provider on Saturday instructed her to take imodium.  Taking two pills after each bm and no relief.  Reports twenty to twenty-five liquid bm's daily.  Has tried to drink Gatorade but it comes right out and has eaten about a tablespoon of potatoes.  Suggested ER and says she was told to avoid the ER.  Will notify providers.

## 2012-07-12 NOTE — ED Notes (Signed)
Patient is aware we need a urine specimen. RN Gabriel Earing said we will let the fluid run and then see if patient is able to use the restroom.

## 2012-07-12 NOTE — Progress Notes (Addendum)
ANTIBIOTIC CONSULT NOTE - INITIAL  Pharmacy Consult for Vancomycin and Zosyn Indication: rule out sepsis  Allergies  Allergen Reactions  . Olmesartan Medoxomil     REACTION: ? if cough  . Tetracycline Hcl     REACTION: unspecified  . Venlafaxine     REACTION: severe dry moouth  . Adhesive (Tape) Rash   Patient Measurements:   Adjusted Body Weight:   Vital Signs: Temp: 101 F (38.3 C) (03/03 1029) Temp src: Oral (03/03 1029) BP: 167/72 mmHg (03/03 1029) Pulse Rate: 126 (03/03 1029) Intake/Output from previous day:   Intake/Output from this shift:    Labs: No results found for this basename: WBC, HGB, PLT, LABCREA, CREATININE,  in the last 72 hours The CrCl is unknown because both a height and weight (above a minimum accepted value) are required for this calculation. No results found for this basename: VANCOTROUGH, VANCOPEAK, VANCORANDOM, GENTTROUGH, GENTPEAK, GENTRANDOM, TOBRATROUGH, TOBRAPEAK, TOBRARND, AMIKACINPEAK, AMIKACINTROU, AMIKACIN,  in the last 72 hours   Microbiology: No results found for this or any previous visit (from the past 720 hour(s)).  Medical History: Past Medical History  Diagnosis Date  . Depression   . DVT (deep venous thrombosis)     hx of on HRT left leg  . GERD (gastroesophageal reflux disease)   . Hyperlipidemia   . Hypertension   . Hypothyroidism   . PPD positive, treated     rx inh   . OSA on CPAP   . Diabetes mellitus   . 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 Cancer    Medications:  Anti-infectives   None     Assessment:  57yo F with admitted with diarrhea and fever.   Pharmacy asked to dose Vanc and Zosyn.  Recent weight was 91kg.  On 2/25 SCr was 0.7, CrCl ~99.  Goal of Therapy:  Vancomycin trough level 15-20 mcg/ml  Plan:   Vancomycin 1g x 1. F/u SCr result.  Zosyn 3.375g IV q8h infused over 4hrs.  Measure Vanc trough at steady state.  Follow up renal fxn and culture results.  Charolotte Eke, PharmD, pager [phone]. 07/12/2012,11:27 AM.  Addendum: SCr is wnl. Will go with Vancomycin 1g IV q8h.  Charolotte Eke, PharmD, pager [phone]. 07/12/2012,12:21 PM.

## 2012-07-12 NOTE — ED Provider Notes (Addendum)
History     CSN: [id number]  Arrival date & time 07/12/12  1024   First MD Initiated Contact with Patient 07/12/12 1028      Chief Complaint  Patient presents with  . Diarrhea  . Fever    (Consider location/radiation/quality/duration/timing/severity/associated sxs/prior treatment) HPI Comments: [name] is currently being treated for cancer and reports she has been feeling ill for 3 days.  She has had significant diarrhea associated with mild abdominal cramping but no pain.  She denies HA, respiratory symptoms, melena, hematochezia, and rashes.  Patient is a 57 y.o. female presenting with diarrhea. The history is provided by the patient. No language interpreter was used.  Diarrhea Quality:  Mucous and watery Severity:  Moderate Onset quality:  Gradual Duration:  3 days Timing:  Intermittent Progression:  Worsening Relieved by:  Nothing Worsened by:  Nothing tried Ineffective treatments:  Anti-motility medications Associated symptoms: chills, fever and myalgias   Associated symptoms: no abdominal pain, no arthralgias, no recent cough, no headaches, no URI and no vomiting   Risk factors: no recent antibiotic use     Past Medical History  Diagnosis Date  . Depression   . DVT (deep venous thrombosis)     hx of on HRT left leg  . GERD (gastroesophageal reflux disease)   . Hyperlipidemia   . Hypertension   . Hypothyroidism   . PPD positive, treated     rx inh   . OSA on CPAP   . Diabetes mellitus   . 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 Cancer     Past Surgical History  Procedure Laterality Date  . Tubal ligation    . Cholecystectomy    . Foot surgery    . Eye surgery    . Cataract extraction    . Percutaneous liver biopsy    . Breast biopsy      left breast  . Mastectomy, radical  06/08/2012    RIGHT  . Mastectomy modified radical  06/08/2012    Procedure: MASTECTOMY MODIFIED RADICAL;  Surgeon: Mariella Saa, MD;  Location: [facility]  OR;  Service: General;  Laterality: Right;  . Portacath placement  06/08/2012    Procedure: INSERTION PORT-A-CATH;  Surgeon: Mariella Saa, MD;  Location: [facility] OR;  Service: General;  Laterality: Left;    Family History  Problem Relation Age of Onset  . Diabetes Mother   . Hypertension Mother   . Arthritis Mother   . Heart disease Mother   . Heart failure Mother   . Other Mother     benign breast mass  . Stroke Father   . Heart disease Father   . Diabetes Paternal Grandmother   . Colon cancer Paternal Grandfather     History  Substance Use Topics  . Smoking status: Former Games developer  . Smokeless tobacco: Never Used  . Alcohol Use: No    OB History   Grav Para Term Preterm Abortions TAB SAB Ect Mult Living   2 2              Review of Systems  Constitutional: Positive for fever, chills, appetite change and fatigue.  HENT: Negative for congestion, sore throat, rhinorrhea and neck pain.   Eyes: Negative for visual disturbance.  Respiratory: Negative for cough, chest tightness and shortness of breath.   Cardiovascular: Negative for chest pain, palpitations and leg swelling.  Gastrointestinal: Positive for nausea and diarrhea. Negative for vomiting and abdominal pain.  Endocrine: Negative for polyuria.  Genitourinary: Negative for dysuria, urgency, frequency and flank pain.  Musculoskeletal: Positive for myalgias. Negative for back pain and arthralgias.  Skin: Negative for wound.  Neurological: Positive for weakness (generalized). Negative for syncope, light-headedness and headaches.  Psychiatric/Behavioral: Negative for confusion and dysphoric mood. The patient is not nervous/anxious.     Allergies  Olmesartan medoxomil; Tetracycline hcl; Venlafaxine; and Adhesive  Home Medications   Current Outpatient Rx  Name  Route  Sig  Dispense  Refill  . ALPRAZolam (XANAX) 0.25 MG tablet   Oral   Take 1 tablet (0.25 mg total) by mouth 3 (three) times daily as needed.   30  tablet   0   . amLODipine (NORVASC) 5 MG tablet      TAKE 1 & 1/2 TABLETS BY MOUTH DAILY   45 tablet   5   . aspirin 81 MG tablet   Oral   Take 81 mg by mouth daily.           [name] atorvastatin (LIPITOR) 10 MG tablet   Oral   Take 1 tablet (10 mg total) by mouth daily.   90 tablet   1     pt would like a 90 day supply   . buPROPion (WELLBUTRIN SR) 150 MG 12 hr tablet   Oral   Take 150 mg by mouth daily.         [name] dexamethasone (DECADRON) 4 MG tablet      2 tabs PO BID on day before and 2 days after chemo. 2 tabs PO in am only on day 4.   30 tablet   2   . escitalopram (LEXAPRO) 10 MG tablet   Oral   Take 1 tablet (10 mg total) by mouth daily.   90 tablet   1   . fluticasone (FLONASE) 50 MCG/ACT nasal spray   Nasal   Place 2 sprays into the nose daily.   16 g   3   . glucose blood (TRUETEST TEST) test strip      Use as instructed   100 each   12   . levothyroxine (SYNTHROID, LEVOTHROID) 125 MCG tablet   Oral   Take 1 tablet (125 mcg total) by mouth daily.   90 tablet   1   . lidocaine-prilocaine (EMLA) cream   Topical   Apply topically as needed.   30 g   1   . LORazepam (ATIVAN) 0.5 MG tablet      1 tab up to twice daily PRN anxiety, insomnia and/or nausea   30 tablet   0   . omeprazole (PRILOSEC) 20 MG capsule   Oral   Take 1 capsule (20 mg total) by mouth 2 (two) times daily.   180 capsule   2   . oxyCODONE-acetaminophen (PERCOCET/ROXICET) 5-325 MG per tablet   Oral   Take 1-2 tablets by mouth every 4 (four) hours as needed.   50 tablet   0   . prochlorperazine (COMPAZINE) 10 MG tablet      1 tab PO ACHS x 3 days after chemo, then 1 tab PO q 6 hr PRN nausea   30 tablet   2   . promethazine (PHENERGAN) 12.5 MG tablet   Oral   Take 1 tablet (12.5 mg total) by mouth every 4 (four) hours as needed for nausea.   30 tablet   0   . saxagliptin HCl (ONGLYZA) 5 MG TABS tablet   Oral  Take 1 tablet (5 mg total) by mouth daily.    90 tablet   1   . telmisartan (MICARDIS) 40 MG tablet   Oral   Take 1 tablet (40 mg total) by mouth daily.   30 tablet   11     BP 167/72  Pulse 126  Temp(Src) 101 F (38.3 C) (Oral)  Resp 18  SpO2 97%  Physical Exam  Nursing note and vitals reviewed. Constitutional: She is oriented to person, place, and time. She has a sickly appearance. She appears ill. Nasal cannula in place.  HENT:  Head: Normocephalic and atraumatic.  Right Ear: External ear normal.  Left Ear: External ear normal.  Nose: Nose normal.  Mouth/Throat: Uvula is midline. Mucous membranes are pale, dry and not cyanotic. No oropharyngeal exudate.  Eyes: Conjunctivae are normal. Pupils are equal, round, and reactive to light. Right eye exhibits no discharge. Left eye exhibits no discharge. No scleral icterus.  Neck: Trachea normal and normal range of motion. Neck supple. No JVD present. No tracheal tenderness, no spinous process tenderness and no muscular tenderness present. No rigidity. No tracheal deviation, no edema and no erythema present.  Cardiovascular: Regular rhythm, normal heart sounds, intact distal pulses and normal pulses.  Tachycardia present.  PMI is not displaced.  Exam reveals no gallop and no decreased pulses.   No murmur heard. Pulmonary/Chest: Effort normal and breath sounds normal. No stridor. No respiratory distress. She has no wheezes. She has no rales. She exhibits no tenderness.  Abdominal: Soft. She exhibits no distension and no mass. Bowel sounds are increased. There is no tenderness. There is no rebound, no guarding and no CVA tenderness. No hernia.  Musculoskeletal: Normal range of motion. She exhibits no edema and no tenderness.  Lymphadenopathy:    She has no cervical adenopathy.  Neurological: She is alert and oriented to person, place, and time. No cranial nerve deficit.  Skin: Skin is warm and dry. No rash noted. She is not diaphoretic. No erythema. There is pallor.  Psychiatric:  She has a normal mood and affect. Her behavior is normal.    ED Course  Procedures (including critical care time)  Labs Reviewed  CULTURE, BLOOD (ROUTINE X 2)  CULTURE, BLOOD (ROUTINE X 2)  URINE CULTURE  STOOL CULTURE  CBC WITH DIFFERENTIAL  COMPREHENSIVE METABOLIC PANEL  URINALYSIS, ROUTINE W REFLEX MICROSCOPIC  LACTIC ACID, PLASMA   No results found.   No diagnosis found.    MDM  Pt presents for evaluation of fever and diarrhea.  She is being treated with chemotherapy for breast CA.  She appears dehydrated, note elevated HR and temperature, NAD.  Will obtain routine labs, CXR, blood, and urine cultures.  She denies risk factors for c-diff colitis and has no abdominal tenderness on exam.  CRITICAL CARE Performed by: Dana Allan T   Total critical care time: 30  There is no infiltrate on the CXR.  She has a pancytopenia with a severely depressed WBC count.  The source for the fever is not apparent.  She has no skin lesions and no respiratory complaints.  The results of the urinalysis are still pending.  Ordered sepsis protocol IVF and abx.  Discussed her evaluation with the hospitalist Izmael Duross on call.  She will be admitted for further evaluation and mgmnt.  Critical care time was exclusive of separately billable procedures and treating other patients.  Critical care was necessary to treat or prevent imminent or life-threatening deterioration.  Critical care was time  spent personally by me on the following activities: development of treatment plan with patient and/or surrogate as well as nursing, discussions with consultants, evaluation of patient's response to treatment, examination of patient, obtaining history from patient or surrogate, ordering and performing treatments and interventions, ordering and review of laboratory studies, ordering and review of radiographic studies, pulse oximetry and re-evaluation of patient's condition.          Rhonda Chad,  MD 07/12/12 1325  Rhonda Chad, MD 08/23/12 [phone]

## 2012-07-12 NOTE — Telephone Encounter (Signed)
This RN called pt per communication with triage- discussed with pt symptoms of "watery diarrhea" "20-25 times a day ", now with fever of 100.4.  Per discussion and need for more urgent assessment and intervention recommended for pt to proceed to the ER at [facility].  Pt verbalized understanding. This RN called to the ER and spoke with charge nurse, Shawna Orleans, informed her of pending arrival.

## 2012-07-12 NOTE — ED Notes (Signed)
Husband Joanne Salah [phone]

## 2012-07-12 NOTE — H&P (Signed)
[facility] History and Physical  [name] YQM:[id number] DOB: 25-Feb-1956 DOA: 07/12/2012  Referring physician: Dr. Dana Allan PCP: [name]., NP  Oncologist: Dr. Ruthann Cancer  Chief Complaint: Fever, diarrhea   History of Present Illness: EFRATA [name] is an 57 y.o. female with a PMH of recently diagnosed breast cancer 05/21/12, status post modified radical mastectomy on the right 06/08/12, status post 1st round of chemotherapy on 07/07/12, with post treatment anorexia but no other symptoms.  She then developed diarrhea 3 days ago, up to 20-30 stools a day, but no associated melena or hematochezia.  No recent use of antibiotics.  Also has a fever and 1-2 day history of a non-productive cough.  The patient has a PMH of DM as well, reports her glucoses have been in the 200's, which is typical.  There have not been any aggravating or alleviating factors, and the episodes of diarrhea have been accompanied by some mild cramping but no frank abdominal pain. She has had a diminished appetite but denies any frank weight loss.  Review of Systems: Constitutional: + fever and chills;  Appetite diminished; No weight loss, no weight gain.  HEENT: No blurry vision, no diplopia, no pharyngitis, no dysphagia CV: No chest pain, no palpitations.  Resp: No SOB, + cough. GI: No nausea, no vomiting, + diarrhea, no melena, no hematochezia.  GU: No dysuria, no hematuria.  MSK: no myalgias, no arthralgias.  Neuro:  No headache, no focal neurological deficits, no history of seizures.  Psych: + depression and anxiety.  Endo: + thyroid disease, + DM, no heat intolerance, + cold intolerance, no polyuria, no polydipsia  Skin: + chronic rash, no skin lesions.  Heme: No easy bruising, + history DVT.  Past Medical History Past Medical History  Diagnosis Date  . Depression   . DVT (deep venous thrombosis)     hx of on HRT left leg  . GERD (gastroesophageal reflux disease)   . Hyperlipidemia   .  Hypertension   . Hypothyroidism   . PPD positive, treated     rx inh   . OSA on CPAP   . Diabetes mellitus   . 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 Breast cancer     Right, s/p mastectomy     Past Surgical History Past Surgical History  Procedure Laterality Date  . Tubal ligation    . Cholecystectomy    . Foot surgery    . Eye surgery    . Cataract extraction    . Percutaneous liver biopsy    . Breast biopsy      left breast  . Mastectomy modified radical  06/08/2012    Procedure: MASTECTOMY MODIFIED RADICAL;  Surgeon: Mariella Saa, MD;  Location: [facility] OR;  Service: General;  Laterality: Right;  . Portacath placement  06/08/2012    Procedure: INSERTION PORT-A-CATH;  Surgeon: Mariella Saa, MD;  Location: [facility] OR;  Service: General;  Laterality: Left;     Social History: History   Social History  . Marital Status: Married    Spouse Name: N/A    Number of Children: 2  . Years of Education: N/A   Occupational History  . SECRETARY [employer]   Social History Main Topics  . Smoking status: Former Games developer  . Smokeless tobacco: Never Used  . Alcohol Use: No  . Drug Use: No  . Sexually Active: No   Other Topics Concern  . Not on file  Social History Narrative   Married   Works at [employer] secretary   Daily caffeine use - 2 cups a day plus a couple of sodas a day   Pt doesn't exercise regularly   G2P2   H H of 5 soon to be 4 .     Family History:  Family History  Problem Relation Age of Onset  . Diabetes Mother   . Hypertension Mother   . Arthritis Mother   . Heart disease Mother   . Heart failure Mother   . Other Mother     benign breast mass  . Stroke Father   . Heart disease Father   . Diabetes Paternal Grandmother   . Colon cancer Paternal Grandfather     Allergies: Olmesartan medoxomil; Tetracycline hcl; Venlafaxine; and Adhesive  Meds: Prior to Admission medications   Medication Sig Start Date End  Date Taking? Authorizing Jasier Calabretta  ALPRAZolam (XANAX) 0.25 MG tablet Take 1 tablet (0.25 mg total) by mouth 3 (three) times daily as needed. 05/24/12  Yes Sandford Craze, NP  amLODipine (NORVASC) 5 MG tablet Take 5 mg by mouth every morning.   Yes Historical Oluchi Pucci, MD  aspirin EC 81 MG tablet Take 81 mg by mouth every morning.   Yes Historical Reeder Brisby, MD  atorvastatin (LIPITOR) 10 MG tablet Take 10 mg by mouth at bedtime.  10/14/11  Yes Madelin Headings, MD  buPROPion (WELLBUTRIN SR) 150 MG 12 hr tablet Take 150 mg by mouth every morning.    Yes Historical Pammie Chirino, MD  dexamethasone (DECADRON) 4 MG tablet Take 4 mg by mouth. 2 tabs PO BID on day before and 2 days after chemo. 2 tabs PO in am only on day 4. 07/01/12  Yes Amy Allegra Grana, PA  escitalopram (LEXAPRO) 10 MG tablet Take 10 mg by mouth at bedtime. 06/29/12  Yes Sandford Craze, NP  glucose blood (TRUETEST TEST) test strip Use as instructed 04/12/12  Yes Madelin Headings, MD  levothyroxine (SYNTHROID, LEVOTHROID) 125 MCG tablet Take 125 mcg by mouth daily before breakfast. 03/31/12  Yes Madelin Headings, MD  lidocaine-prilocaine (EMLA) cream Apply topically as needed. 07/01/12  Yes Amy Allegra Grana, PA  LORazepam (ATIVAN) 0.5 MG tablet 1 tab up to twice daily PRN anxiety, insomnia and/or nausea 07/01/12  Yes Amy Allegra Grana, PA  omeprazole (PRILOSEC) 20 MG capsule Take 1 capsule (20 mg total) by mouth 2 (two) times daily. 11/18/11  Yes Madelin Headings, MD  prochlorperazine (COMPAZINE) 10 MG tablet 1 tab PO ACHS x 3 days after chemo, then 1 tab PO q 6 hr PRN nausea 07/01/12  Yes Amy Allegra Grana, PA  promethazine (PHENERGAN) 12.5 MG tablet Take 1 tablet (12.5 mg total) by mouth every 4 (four) hours as needed for nausea. 06/29/12  Yes Mariella Saa, MD  saxagliptin HCl (ONGLYZA) 5 MG TABS tablet Take 5 mg by mouth every morning. 06/29/12  Yes Sandford Craze, NP    Physical Exam: Filed Vitals:   07/12/12 1029 07/12/12 1438  BP: 167/72 156/58  Pulse: 126  105  Temp: 101 F (38.3 C) 101.1 F (38.4 C)  TempSrc: Oral Oral  Resp: 18 18  SpO2: 97% 96%     Physical Exam: Blood pressure 156/58, pulse 105, temperature 101.1 F (38.4 C), temperature source Oral, resp. rate 18, SpO2 96.00%. Gen: No acute distress. Head: Normocephalic, atraumatic. Eyes: PERRL, EOMI, sclerae nonicteric. Mouth: Oropharynx with dry mucous membranes. No mucositis, or thrush. No posterior pharyngeal  exudates. Neck: Supple, no thyromegaly, no lymphadenopathy, no jugular venous distention. Chest: Lungs coarse but otherwise clear. CV: Heart sounds tachycardic without murmur, rubs, or gallops. Abdomen: Soft, nontender, nondistended with normal active bowel sounds. Extremities: Extremities are without clubbing, edema, or cyanosis. Skin: Warm and dry. Macular rash noted to the upper extremities and neck. Neuro: Alert and oriented times 3; cranial nerves II through XII grossly intact. Psych: Mood and affect depressed.  Labs on Admission:  Basic Metabolic Panel:  Recent Labs Lab 07/06/12 0858 07/12/12 1111  NA 139 129*  K 3.8 3.1*  CL 108* 94*  CO2 21* 23  GLUCOSE 185* 153*  BUN 8.5 8  CREATININE 0.7 0.51  CALCIUM 8.9 8.3*   Liver Function Tests:  Recent Labs Lab 07/06/12 0858 07/12/12 1111  AST 32 35  ALT 42 57*  ALKPHOS 184* 143*  BILITOT 0.46 1.2  PROT 7.0 6.4  ALBUMIN 3.2* 3.2*   CBC:  Recent Labs Lab 07/06/12 0859 07/12/12 1111  WBC 5.7 0.8*  NEUTROABS 4.5  --   HGB 11.3* 11.7*  HCT 35.4 35.7*  MCV 78.1* 76.4*  PLT 153 108*    Radiological Exams on Admission: Dg Chest 2 View  07/12/2012  *RADIOLOGY REPORT*  Clinical Data: Fever.  Breast cancer  CHEST - 2 VIEW  Comparison: 06/08/2012  Findings: Lungs are clear without evidence of pneumonia.  No heart failure or effusion.  Negative for mass lesion.  Port-A-Cath tip in the SVC.  IMPRESSION: No acute abnormality.   Original Report Authenticated By: [name], M.D.      Assessment/Plan Principal Problem:   Neutropenic fever -We will admit the patient and place her on neutropenic precautions. -Will treat her with broad-spectrum antibiotics including vancomycin and Zosyn as she does have a Port-A-Cath in place. -Followup blood and urine cultures which have been requested. -Followup stool cultures and stool for C. difficile PCR, which has been requested. -Initiate therapy with Neupogen and follow CBC with differential daily until absolute neutrophil count improved. Active Problems:   HYPOTHYROIDISM -Continue home dose of Synthroid.   DIABETES MELLITUS, TYPE II -Check CBGs q. a.c. and cover with sliding scale insulin, sensitivity scale. -Continue Onglyza.   HYPERLIPIDEMIA -Continue Lipitor.   DEPRESSION -Continue Wellbutrin and Lexapro. Ativan as needed for breakthrough anxiety.   OBSTRUCTIVE SLEEP APNEA -Continue CPAP each bedtime per home settings.   HYPERTENSION -Continue Norvasc.   GERD -Continue PPI therapy.   DVT, HX OF -Continue aspirin and place on prophylactic dose Lovenox.   Liver disease, chronic, with cirrhosis -LFTs currently stable.   Cancer of breast -Status post recent chemotherapy. We'll notify Dr. [name] of the patient's admission.   Microcytic anemia / Thrombocytopenia -Likely the sequela of recent chemotherapy. Monitor blood counts closely. Monitor for signs of bleeding. Transfuse as needed for hemoglobin less than 7.   Code Status: Full. Family Communication: Jirah Rider (husband) [phone] or [phone] (work). Disposition Plan: Home when stable.  Time spent: 1 hour.  RAMA,CHRISTINA [facility] Pager [phone]  If 7PM-7AM, please contact night-coverage [email or web address] Password [facility] 07/12/2012, 2:52 PM

## 2012-07-12 NOTE — ED Notes (Signed)
Pt is a CA pt, complaining of diarrhea and fever since this past Friday, denies n/v, states has taken immodium but not helping.

## 2012-07-13 ENCOUNTER — Ambulatory Visit: Payer: 59 | Admitting: Physician Assistant

## 2012-07-13 ENCOUNTER — Other Ambulatory Visit: Payer: 59 | Admitting: Lab

## 2012-07-13 DIAGNOSIS — E876 Hypokalemia: Secondary | ICD-10-CM | POA: Diagnosis present

## 2012-07-13 DIAGNOSIS — E43 Unspecified severe protein-calorie malnutrition: Secondary | ICD-10-CM | POA: Diagnosis present

## 2012-07-13 DIAGNOSIS — E871 Hypo-osmolality and hyponatremia: Secondary | ICD-10-CM | POA: Diagnosis present

## 2012-07-13 DIAGNOSIS — E669 Obesity, unspecified: Secondary | ICD-10-CM | POA: Diagnosis present

## 2012-07-13 LAB — GLUCOSE, CAPILLARY
Glucose-Capillary: 121 mg/dL — ABNORMAL HIGH (ref 70–99)
Glucose-Capillary: 93 mg/dL (ref 70–99)
Glucose-Capillary: 123 mg/dL — ABNORMAL HIGH (ref 70–99)

## 2012-07-13 LAB — CBC WITH DIFFERENTIAL/PLATELET
Eosinophils Relative: 0 % (ref 0–5)
HCT: 32 % — ABNORMAL LOW (ref 36.0–46.0)
Lymphocytes Relative: 29 % (ref 12–46)
Lymphs Abs: 0.8 10*3/uL (ref 0.7–4.0)
MCV: 76.2 fL — ABNORMAL LOW (ref 78.0–100.0)
Monocytes Absolute: 0.5 10*3/uL (ref 0.1–1.0)
Monocytes Relative: 19 % — ABNORMAL HIGH (ref 3–12)
RBC: 4.2 MIL/uL (ref 3.87–5.11)
WBC: 2.7 10*3/uL — ABNORMAL LOW (ref 4.0–10.5)

## 2012-07-13 LAB — MAGNESIUM: Magnesium: 1.7 mg/dL (ref 1.5–2.5)

## 2012-07-13 LAB — COMPREHENSIVE METABOLIC PANEL
AST: 20 U/L (ref 0–37)
Albumin: 2.5 g/dL — ABNORMAL LOW (ref 3.5–5.2)
BUN: 5 mg/dL — ABNORMAL LOW (ref 6–23)
Creatinine, Ser: 0.47 mg/dL — ABNORMAL LOW (ref 0.50–1.10)
Potassium: 3 mEq/L — ABNORMAL LOW (ref 3.5–5.1)
Total Protein: 5.3 g/dL — ABNORMAL LOW (ref 6.0–8.3)

## 2012-07-13 LAB — CLOSTRIDIUM DIFFICILE BY PCR: Toxigenic C. Difficile by PCR: NEGATIVE

## 2012-07-13 MED ORDER — FILGRASTIM 480 MCG/1.6ML IJ SOLN
480.0000 ug | Freq: Every day | INTRAMUSCULAR | Status: DC
  Filled 2012-07-13: qty 1.6

## 2012-07-13 MED ORDER — BOOST / RESOURCE BREEZE PO LIQD
1.0000 | Freq: Two times a day (BID) | ORAL | Status: DC
  Administered 2012-07-14 – 2012-07-15 (×4): 1 via ORAL

## 2012-07-13 MED ORDER — BIOTENE DRY MOUTH MT LIQD
15.0000 mL | OROMUCOSAL | Status: DC | PRN
  Administered 2012-07-13: 15 mL via OROMUCOSAL

## 2012-07-13 MED ORDER — DILTIAZEM LOAD VIA INFUSION
10.0000 mg | Freq: Once | INTRAVENOUS | Status: AC
  Administered 2012-07-13: 10 mg via INTRAVENOUS
  Filled 2012-07-13: qty 10

## 2012-07-13 MED ORDER — MIRTAZAPINE 7.5 MG PO TABS
7.5000 mg | ORAL_TABLET | Freq: Every day | ORAL | Status: DC
  Administered 2012-07-13 – 2012-07-20 (×8): 7.5 mg via ORAL
  Filled 2012-07-13 (×9): qty 1

## 2012-07-13 MED ORDER — DILTIAZEM HCL 100 MG IV SOLR
10.0000 mg/h | INTRAVENOUS | Status: DC
  Administered 2012-07-13: 10 mg/h via INTRAVENOUS
  Administered 2012-07-14: 15 mg/h via INTRAVENOUS
  Filled 2012-07-13 (×3): qty 100

## 2012-07-13 MED ORDER — ENSURE COMPLETE PO LIQD
237.0000 mL | Freq: Two times a day (BID) | ORAL | Status: DC
  Administered 2012-07-13: 237 mL via ORAL

## 2012-07-13 MED ORDER — POTASSIUM CHLORIDE 10 MEQ/50ML IV SOLN
10.0000 meq | INTRAVENOUS | Status: AC
  Administered 2012-07-13 (×5): 10 meq via INTRAVENOUS
  Filled 2012-07-13 (×5): qty 50

## 2012-07-13 MED ORDER — METOPROLOL TARTRATE 1 MG/ML IV SOLN
5.0000 mg | INTRAVENOUS | Status: AC
  Administered 2012-07-13: 5 mg via INTRAVENOUS
  Filled 2012-07-13: qty 5

## 2012-07-13 MED ORDER — SODIUM CHLORIDE 0.9 % IJ SOLN
10.0000 mL | INTRAMUSCULAR | Status: DC | PRN
  Administered 2012-07-13: 10 mL
  Administered 2012-07-14: 20 mL
  Administered 2012-07-14 – 2012-07-21 (×2): 10 mL

## 2012-07-13 MED ORDER — FILGRASTIM 480 MCG/1.6ML IJ SOLN
480.0000 ug | Freq: Once | INTRAMUSCULAR | Status: AC
  Administered 2012-07-13: 480 ug via SUBCUTANEOUS
  Filled 2012-07-13: qty 1.6

## 2012-07-13 MED ORDER — DIPHENOXYLATE-ATROPINE 2.5-0.025 MG PO TABS
1.0000 | ORAL_TABLET | Freq: Four times a day (QID) | ORAL | Status: AC
  Administered 2012-07-13 – 2012-07-14 (×7): 1 via ORAL
  Filled 2012-07-13 (×7): qty 1

## 2012-07-13 NOTE — Progress Notes (Addendum)
[facility] PROGRESS NOTE  KANYAH MATSUSHIMA WUJ:[id number] DOB: November 11, 1955 DOA: 07/12/2012 PCP: [name]., NP  Brief narrative: [name] is an 57 y.o. female with past medical history of recently diagnosed right breast cancer, status post modified radical mastectomy and her first round of chemotherapy on 07/07/2012, who presented to the hospital on 07/12/2012 with neutropenic fever. She also had complaints of diarrhea.  Assessment/Plan: Principal Problem:  Neutropenic fever  -Continue neutropenic precautions.  -Continue broad-spectrum antibiotics including vancomycin and Zosyn as she does have a Port-A-Cath in place.  -Followup blood and urine cultures.  -Followup stool cultures.  C. difficile PCR negative. Start on Lomotil. -Continue Neupogen, white blood cell count and neutrophil count improving. Can likely D/C Neupogen after tonight's dose. Active Problems:  Anorexia / Severe malnutrition in the context of acute illness / Obesity -Patient requests something to stimulate her appetite.  Low dose Remeron started. -Dietician consult.   -Add Ensure. Hypokalemia -Potassium added to IV fluids. We'll give 5 potassium runs today.  Magnesium OK. -Secondary to ongoing GI losses/diarrhea.  Lomotil to slow diarrhea. 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 HYPOTHYROIDISM  -Continue home dose of Synthroid.  DIABETES MELLITUS, TYPE II  -Check CBGs q. a.c. and cover with sliding scale insulin, sensitive scale. CBGs 101-121. -Continue Onglyza.  HYPERLIPIDEMIA  -Continue Lipitor.  DEPRESSION  -Continue Wellbutrin and Lexapro. Ativan as needed for breakthrough anxiety.  OBSTRUCTIVE SLEEP APNEA  -Continue CPAP each bedtime per home settings.  HYPERTENSION  -Continue Norvasc.  GERD  -Continue PPI therapy.  DVT, HX OF  -Continue aspirin and prophylactic dose Lovenox.  Liver disease, chronic, with cirrhosis  -LFTs currently stable.  Cancer of breast  -Status post  recent chemotherapy. Dr. [name] is out of town, can follow up with him post discharge.  Microcytic anemia / Thrombocytopenia  -Likely the sequela of recent chemotherapy. Monitor blood counts closely. Monitor for signs of bleeding. Transfuse as needed for hemoglobin less than 7.  Code Status: Full.  Family Communication: Jahnavi Muratore (husband) [phone] or [phone] (work) updated at bedside 07/12/12.  Disposition Plan: Home when stable.   Medical Consultants:  None.  Other Consultants:  None.  Anti-infectives:  Vancomycin 07/12/2012--->  Zosyn 07/12/2012--->  HPI/Subjective: [name] had fever to 102.1 yesterday, but has defervesced overnight.  She feels much better, but still has a very poor appetite.  No N/V.  Still with loose stools and abdominal cramps.  Objective: Filed Vitals:   07/12/12 1438 07/12/12 1502 07/12/12 2225 07/13/12 0646  BP: 156/58 162/60 129/58 126/54  Pulse: 105 104 94 90  Temp: 101.1 F (38.4 C) 102.1 F (38.9 C) 98 F (36.7 C) 97.7 F (36.5 C)  TempSrc: Oral Oral Oral Oral  Resp: 18 18 18 18   Height:  5\' 4"  (1.626 m)    Weight:  86.3 kg (190 lb 4.1 oz)    SpO2: 96% 96% 98% 98%    Intake/Output Summary (Last 24 hours) at 07/13/12 1043 Last data filed at 07/13/12 0753  Gross per 24 hour  Intake    120 ml  Output      6 ml  Net    114 ml    Exam: Gen:  NAD Cardiovascular:  RRR, No M/R/G Respiratory:  Lungs CTAB Gastrointestinal:  Abdomen soft, NT/ND, + BS Extremities:  No C/E/C  Data Reviewed: Basic Metabolic Panel:  Recent Labs Lab 07/12/12 1111 07/13/12 0550  NA 129* 130*  K 3.1* 3.0*  CL 94* 97  CO2 23 25  GLUCOSE 153* 109*  BUN 8 5*  CREATININE 0.51 0.47*  CALCIUM 8.3* 7.5*  MG  --  1.7   GFR Estimated Creatinine Clearance: 81.4 ml/min (by C-G formula based on Cr of 0.47). Liver Function Tests:  Recent Labs Lab 07/12/12 1111 07/13/12 0550  AST 35 20  ALT 57* 36*  ALKPHOS 143* 111  BILITOT 1.2 0.6  PROT  6.4 5.3*  ALBUMIN 3.2* 2.5*   CBC:  Recent Labs Lab 07/12/12 1111 07/13/12 0550  WBC 0.8* 2.7*  NEUTROABS  --  1.3*  HGB 11.7* 10.7*  HCT 35.7* 32.0*  MCV 76.4* 76.2*  PLT 108* 90*   CBG:  Recent Labs Lab 07/12/12 1651 07/12/12 2155 07/13/12 0740  GLUCAP 116* 101* 121*   Microbiology Recent Results (from the past 240 hour(s))  CULTURE, BLOOD (ROUTINE X 2)     Status: None   Collection Time    07/12/12 11:45 AM      Result Value Range Status   Specimen Description BLOOD LEFT FOREARM   Final   Special Requests BOTTLES DRAWN AEROBIC AND ANAEROBIC   Final   Culture  Setup Time 07/12/2012 15:29   Final   Culture     Final   Value:        BLOOD CULTURE RECEIVED NO GROWTH TO DATE CULTURE WILL BE HELD FOR 5 DAYS BEFORE ISSUING A FINAL NEGATIVE REPORT   Report Status PENDING   Incomplete  CULTURE, BLOOD (ROUTINE X 2)     Status: None   Collection Time    07/12/12 11:51 AM      Result Value Range Status   Specimen Description BLOOD EFT HAND   Final   Special Requests BOTTLES DRAWN AEROBIC AND ANAEROBIC   Final   Culture  Setup Time 07/12/2012 15:29   Final   Culture     Final   Value:        BLOOD CULTURE RECEIVED NO GROWTH TO DATE CULTURE WILL BE HELD FOR 5 DAYS BEFORE ISSUING A FINAL NEGATIVE REPORT   Report Status PENDING   Incomplete  CLOSTRIDIUM DIFFICILE BY PCR     Status: None   Collection Time    07/12/12  6:30 PM      Result Value Range Status   C difficile by pcr NEGATIVE  NEGATIVE Final     Procedures and Diagnostic Studies: Dg Chest 2 View  07/12/2012  *RADIOLOGY REPORT*  Clinical Data: Fever.  Breast cancer  CHEST - 2 VIEW  Comparison: 06/08/2012  Findings: Lungs are clear without evidence of pneumonia.  No heart failure or effusion.  Negative for mass lesion.  Port-A-Cath tip in the SVC.  IMPRESSION: No acute abnormality.   Original Report Authenticated By: [name], M.D.     Scheduled Meds: . amLODipine  5 mg Oral q morning - 10a  . aspirin  EC  81 mg Oral q morning - 10a  . atorvastatin  10 mg Oral QHS  . buPROPion  150 mg Oral q morning - 10a  . enoxaparin (LOVENOX) injection  40 mg Subcutaneous Q24H  . escitalopram  10 mg Oral QHS  . filgrastim (NEUPOGEN)  SQ  480 mcg Subcutaneous q1800  . insulin aspart  0-9 Units Subcutaneous TID WC  . levothyroxine  125 mcg Oral QAC breakfast  . linagliptin  5 mg Oral Daily  . pantoprazole  40 mg Oral Daily  . piperacillin-tazobactam (ZOSYN)  IV  3.375 g Intravenous Q8H  . potassium chloride  10 mEq Intravenous Q1 Hr x 5  . vancomycin  1,000 mg Intravenous Q8H   Continuous Infusions: . 0.9 % NaCl with KCl 40 mEq / L 100 mL/hr at 07/13/12 0556    Time spent: 35 minutes.   LOS: 1 day   RAMA,CHRISTINA  [facility] Pager [phone].  If 8PM-8AM, please contact night-coverage at [email or web address], password [facility] 07/13/2012, 10:43 AM

## 2012-07-13 NOTE — Progress Notes (Signed)
Subjective/HPI: [name] is a 57 yo WF with a PMHx of recently dx'd breast cancer (1/14) s/p 1st round of chemo and right mastectomy, DM II, HLD, OSA, HTN, GERD, and DVT on anticoag about 5 yrs ago who presented to ED on 07/12/12 with fever/chills, anorexia and diarrhea for 1-2 days before admission. She was diagnosed with neutropenic fever and admitted for further treatment. She was placed on multiple abx and pan cultured. Her WBCC is higher today. She is s/p 5 runs of KCL for a K of 3.0 today. Mg is normal. She has anemia/thrombocytopenia thought to be 2/2 chemotherapy and that is stable.  Tonight, this NP was paged by RN stating pt's HR was up into the 130-160s and EKG revealed Afib with RVR. Metoprolol ordered IV x 1 and NP to bedside. Pt states she has no hx of AFib. She apparently has had some tachycardia in the past but can't clarify that further and says she hasn't been on anticoags for her heart rhythm before, but did take Coumadin for about 6 mos 5 yrs ago for a DVT. She presently denies any dizziness, SOB, chest pain, palpitations, flutters, vision changes, mentation changes, wheezing, or abd pain. She says she "had no idea her heart was out of rhythm" until we told her.  NP went into detail about diagnosis of Afib, possible reasons for acute onset, treatment for tonight and further testing that is likely to be done. Talked shortly about anticoagulation and risks of stroke with Afib but told her the need for anticoagulation will be determined later. She has no questions and agrees to treatment with Cardizem and transfer to a tele floor.   ROS: neg except as noted in the HPI  Objective: Vital signs in last 24 hours: Temp:  [97.7 F (36.5 C)-98.5 F (36.9 C)] 97.7 F (36.5 C) (03/04 2205) Pulse Rate:  [90-98] 98 (03/04 1355) Resp:  [18-20] 20 (03/04 2205) BP: (126-137)/(54-70) 129/70 mmHg (03/04 2205) SpO2:  [97 %-98 %] 97 % (03/04 2205) Weight change:  Last BM Date: 07/13/12  PE: Gen:  overweight WF lying in bed in NAD. HEENT: [place], AT, no JVD. Neck supple.  CARD: irreg irreg, tachycardic, no MRG RESP: normal effort. Lungs CTA. ABD: round, soft, NT MSK: MOE x 4, no edema. NEURO: grossly intact, no focal deficit noted. PSYCH: A & O. Pleasant, cooperative. Affect normal for situation.  Lab Results:  Recent Labs  07/12/12 1111 07/13/12 0550  WBC 0.8* 2.7*  HGB 11.7* 10.7*  HCT 35.7* 32.0*  PLT 108* 90*   BMET  Recent Labs  07/12/12 1111 07/13/12 0550  NA 129* 130*  K 3.1* 3.0*  CL 94* 97  CO2 23 25  GLUCOSE 153* 109*  BUN 8 5*  CREATININE 0.51 0.47*  CALCIUM 8.3* 7.5*    Studies/Results: Dg Chest 2 View  07/12/2012  *RADIOLOGY REPORT*  Clinical Data: Fever.  Breast cancer  CHEST - 2 VIEW  Comparison: 06/08/2012  Findings: Lungs are clear without evidence of pneumonia.  No heart failure or effusion.  Negative for mass lesion.  Port-A-Cath tip in the SVC.  IMPRESSION: No acute abnormality.   Original Report Authenticated By: [name], M.D.     Medications: reviewed  Assessment/Plan: 1. New onset Atrial fibrillation-likely 2/2 stress/infection/fevers. Metoprolol given once. Cardizem gtt to be initiated at 10mg  and can be titrated to 15mg /hr as needed. Telemetry floor. BP is stable but will watch with use of Cardizem. D/C'd Norvasc in Cardizem setting. Cont other meds.  Explained purpose of Cardizem and goal HR and hopeful conversion to NSR. Get Echo in am. Labwork, CXR.  If pt remains stable tonight, will not call cardio consult. Will defer to attending in am. CHADs score-4 (DM, HTN, previous DVT), so may require anticoagulation.  2. Hypokalemia-repleted today, recheck stat BMP. Mg was normal. 3. Hypothyroidism-in light of arrythmia, check TSH and free T4. Cont Synthroid. 4. OSA-stable, cont cpap. 5. HLD-stable, cont Lipitor. 6. DM II-sugars stable, cont meds, SSI prn.  7. Newly dx'd breast cancer s/p first round of chemo with  anemia/thrombocytopenia-stable, hemoc aware of admission. 8. Neutropenic fever-improving WBCC. Afebrile at present. Cont abx, awaiting culture results. Cont neutropenic precautions. CBC now and daily.   Given pt's hemodynamic stability, this NP feels it is prudent to TF to tele floor. However, will not hesitate to TF to SDU if she should deteriorate or have any cardiac sx or hemodynamic instability.  Will cont to follow.    LOS: 1 day   KIRBY-GRAHAM, KAREN 07/13/2012, 11:12 PM

## 2012-07-13 NOTE — Progress Notes (Signed)
INITIAL NUTRITION ASSESSMENT  Pt meets criteria for severe MALNUTRITION in the context of acute illness as evidenced by <50% energy intake with 5.5% weight loss in the past week.  DOCUMENTATION CODES Per approved criteria  -Severe malnutrition in the context of acute illness or injury -Obesity Unspecified   INTERVENTION: - Recommend MD order Biotene for dry mouth - D/C Ensure per pt request, Resource Breeze BID - Assisted pt with ordering meals - Discussed diet therapy for diarrhea - Will continue to monitor   NUTRITION DIAGNOSIS: Inadequate oral intake related to poor appetite, dry mouth as evidenced by <50% meal intake.   Goal: 1. Resolution of diarrhea 2. Pt to consume >90% of meals/supplements  Monitor:  Weights, labs, intake, diarrhea  Reason for Assessment: Consult  57 y.o. female  Admitting Dx: Neutropenic fever  ASSESSMENT: Pt with recently diagnosed breast CA s/p right mastectomy and 1 cycle of chemotherapy. Pt developed diarrhea 3 days ago, 20-30 stools/day. Pt went from 201 pounds to 190 pounds in the past week. Pt reports poor appetite in the past week with pt only consuming bites of food at mealtimes and drinking 16 ounce bottle of Gatorade/day. Pt states before then she had a good appetite and was eating 3 meals/day. Pt reports the diarrhea has improved today with only 3 episodes so far. Pt didn't like the taste of the breakfast potatoes and bagel. Pt c/o dry mouth that started today. Pt does not like Ensure but willing to try [employer].   Height: Ht Readings from Last 1 Encounters:  07/12/12 5\' 4"  (1.626 m)    Weight: Wt Readings from Last 1 Encounters:  07/12/12 190 lb 4.1 oz (86.3 kg)    Ideal Body Weight: 120 lb  % Ideal Body Weight: 158  Wt Readings from Last 10 Encounters:  07/12/12 190 lb 4.1 oz (86.3 kg)  07/05/12 201 lb 1.9 oz (91.227 kg)  07/01/12 196 lb 6.4 oz (89.086 kg)  06/29/12 197 lb (89.359 kg)  06/04/12 210 lb 11.2 oz (95.573  kg)  06/03/12 208 lb 6.4 oz (94.53 kg)  05/27/12 210 lb (95.255 kg)  05/03/12 215 lb (97.523 kg)  02/16/12 219 lb 12.8 oz (99.701 kg)  12/10/11 212 lb (96.163 kg)    Usual Body Weight: 198 lb  % Usual Body Weight: 96  BMI:  Body mass index is 32.64 kg/(m^2). Class I obesity  Estimated Nutritional Needs: Kcal: 1450-1750 Protein: 55-65g Fluid: 1.4-1.7L/day  Skin: Intact   Diet Order: General  EDUCATION NEEDS: -Education needs addressed - briefly discussed diet therapy for diarrhea   Intake/Output Summary (Last 24 hours) at 07/13/12 1415 Last data filed at 07/13/12 0753  Gross per 24 hour  Intake    120 ml  Output      6 ml  Net    114 ml    Last BM: 3/4, diarrhea  Labs:   Recent Labs Lab 07/12/12 1111 07/13/12 0550  NA 129* 130*  K 3.1* 3.0*  CL 94* 97  CO2 23 25  BUN 8 5*  CREATININE 0.51 0.47*  CALCIUM 8.3* 7.5*  MG  --  1.7  GLUCOSE 153* 109*    CBG (last 3)   Recent Labs  07/12/12 2155 07/13/12 0740 07/13/12 1118  GLUCAP 101* 121* 93    Scheduled Meds: . amLODipine  5 mg Oral q morning - 10a  . aspirin EC  81 mg Oral q morning - 10a  . atorvastatin  10 mg Oral QHS  . buPROPion  150 mg Oral q morning - 10a  . diphenoxylate-atropine  1 tablet Oral QID  . enoxaparin (LOVENOX) injection  40 mg Subcutaneous Q24H  . escitalopram  10 mg Oral QHS  . feeding supplement  237 mL Oral BID BM  . filgrastim (NEUPOGEN)  SQ  480 mcg Subcutaneous ONCE-1800  . insulin aspart  0-9 Units Subcutaneous TID WC  . levothyroxine  125 mcg Oral QAC breakfast  . linagliptin  5 mg Oral Daily  . mirtazapine  7.5 mg Oral QHS  . pantoprazole  40 mg Oral Daily  . piperacillin-tazobactam (ZOSYN)  IV  3.375 g Intravenous Q8H  . vancomycin  1,000 mg Intravenous Q8H    Continuous Infusions: . 0.9 % NaCl with KCl 40 mEq / L 100 mL/hr at 07/13/12 4098    Past Medical History  Diagnosis Date  . Depression   . DVT (deep venous thrombosis)     hx of on HRT left leg   . GERD (gastroesophageal reflux disease)   . Hyperlipidemia   . Hypertension   . Hypothyroidism   . PPD positive, treated     rx inh   . OSA on CPAP   . Diabetes mellitus   . 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 Breast cancer     Right, s/p mastectomy    Past Surgical History  Procedure Laterality Date  . Tubal ligation    . Cholecystectomy    . Foot surgery    . Eye surgery    . Cataract extraction    . Percutaneous liver biopsy    . Breast biopsy      left breast  . Mastectomy modified radical  06/08/2012    Procedure: MASTECTOMY MODIFIED RADICAL;  Surgeon: Mariella Saa, MD;  Location: [facility] OR;  Service: General;  Laterality: Right;  . Portacath placement  06/08/2012    Procedure: INSERTION PORT-A-CATH;  Surgeon: Mariella Saa, MD;  Location: [facility] OR;  Service: General;  Laterality: Left;     Levon Hedger MS, RD, LDN [phone] Pager [phone] After Hours Pager

## 2012-07-13 NOTE — Progress Notes (Signed)
Husband at bedside. Visited with Jan and Gracelyn Nurse (pet therapy team). 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 She misses it and her co-workers. She also talked about her dogs at home (2 pomeranians and a terrier). Good visit. She and her spouse requested prayer. Prayed. Listened and supported.

## 2012-07-14 ENCOUNTER — Encounter (HOSPITAL_COMMUNITY): Payer: Self-pay | Admitting: Physician Assistant

## 2012-07-14 DIAGNOSIS — I517 Cardiomegaly: Secondary | ICD-10-CM

## 2012-07-14 DIAGNOSIS — I4891 Unspecified atrial fibrillation: Secondary | ICD-10-CM

## 2012-07-14 DIAGNOSIS — C50919 Malignant neoplasm of unspecified site of unspecified female breast: Secondary | ICD-10-CM

## 2012-07-14 DIAGNOSIS — R197 Diarrhea, unspecified: Secondary | ICD-10-CM

## 2012-07-14 LAB — TROPONIN I
Troponin I: <0.3 ng/mL (ref <0.30)
Troponin I: <0.3 ng/mL (ref <0.30)
Troponin I: <0.3 ng/mL (ref <0.30)

## 2012-07-14 LAB — BASIC METABOLIC PANEL
BUN: 5 mg/dL — ABNORMAL LOW (ref 6–23)
CO2: 22 mEq/L (ref 19–32)
CO2: 24 mEq/L (ref 19–32)
Calcium: 7.9 mg/dL — ABNORMAL LOW (ref 8.4–10.5)
Chloride: 102 mEq/L (ref 96–112)
Creatinine, Ser: 0.83 mg/dL (ref 0.50–1.10)
Creatinine, Ser: 1 mg/dL (ref 0.50–1.10)
GFR calc Af Amer: 71 mL/min — ABNORMAL LOW (ref >90)
GFR calc non Af Amer: 61 mL/min — ABNORMAL LOW (ref >90)
Sodium: 137 mEq/L (ref 135–145)

## 2012-07-14 LAB — GLUCOSE, CAPILLARY
Glucose-Capillary: 133 mg/dL — ABNORMAL HIGH (ref 70–99)
Glucose-Capillary: 113 mg/dL — ABNORMAL HIGH (ref 70–99)
Glucose-Capillary: 115 mg/dL — ABNORMAL HIGH (ref 70–99)

## 2012-07-14 LAB — CBC WITH DIFFERENTIAL/PLATELET
Basophils Absolute: 0.1 10*3/uL (ref 0.0–0.1)
HCT: 35.1 % — ABNORMAL LOW (ref 36.0–46.0)
Lymphs Abs: 1.1 10*3/uL (ref 0.7–4.0)
MCV: 75.6 fL — ABNORMAL LOW (ref 78.0–100.0)
Monocytes Relative: 10 % (ref 3–12)
Neutro Abs: 5.5 10*3/uL (ref 1.7–7.7)
RDW: 14.5 % (ref 11.5–15.5)
WBC: 7.5 10*3/uL (ref 4.0–10.5)

## 2012-07-14 LAB — URINALYSIS, ROUTINE W REFLEX MICROSCOPIC
Bilirubin Urine: NEGATIVE
Hgb urine dipstick: NEGATIVE
Nitrite: NEGATIVE
Specific Gravity, Urine: 1.02 (ref 1.005–1.030)
pH: 5.5 (ref 5.0–8.0)

## 2012-07-14 LAB — PROTIME-INR
INR: 1.32 (ref 0.00–1.49)
Prothrombin Time: 16.1 seconds — ABNORMAL HIGH (ref 11.6–15.2)

## 2012-07-14 LAB — HEPARIN LEVEL (UNFRACTIONATED): Heparin Unfractionated: <0.1 IU/mL — ABNORMAL LOW (ref 0.30–0.70)

## 2012-07-14 LAB — APTT: aPTT: 34 seconds (ref 24–37)

## 2012-07-14 LAB — TSH: TSH: 3.64 u[IU]/mL (ref 0.350–4.500)

## 2012-07-14 LAB — T4, FREE: Free T4: 1.1 ng/dL (ref 0.80–1.80)

## 2012-07-14 MED ORDER — METOPROLOL TARTRATE 25 MG PO TABS
25.0000 mg | ORAL_TABLET | Freq: Once | ORAL | Status: AC
  Administered 2012-07-14: 25 mg via ORAL
  Filled 2012-07-14: qty 1

## 2012-07-14 MED ORDER — METOPROLOL TARTRATE 12.5 MG HALF TABLET
12.5000 mg | ORAL_TABLET | Freq: Two times a day (BID) | ORAL | Status: DC
  Administered 2012-07-14 (×2): 12.5 mg via ORAL
  Filled 2012-07-14 (×4): qty 1

## 2012-07-14 MED ORDER — METOPROLOL TARTRATE 25 MG PO TABS
25.0000 mg | ORAL_TABLET | Freq: Three times a day (TID) | ORAL | Status: DC
  Filled 2012-07-14 (×3): qty 1

## 2012-07-14 MED ORDER — POTASSIUM CHLORIDE CRYS ER 20 MEQ PO TBCR
20.0000 meq | EXTENDED_RELEASE_TABLET | Freq: Once | ORAL | Status: AC
  Administered 2012-07-14: 20 meq via ORAL
  Filled 2012-07-14: qty 1

## 2012-07-14 MED ORDER — HEPARIN BOLUS VIA INFUSION
2000.0000 [IU] | Freq: Once | INTRAVENOUS | Status: AC
  Administered 2012-07-14: 2000 [IU] via INTRAVENOUS
  Filled 2012-07-14: qty 2000

## 2012-07-14 MED ORDER — DIPHENHYDRAMINE HCL 25 MG PO CAPS
25.0000 mg | ORAL_CAPSULE | Freq: Four times a day (QID) | ORAL | Status: DC | PRN
  Filled 2012-07-14: qty 1

## 2012-07-14 MED ORDER — METOPROLOL TARTRATE 25 MG PO TABS
25.0000 mg | ORAL_TABLET | Freq: Three times a day (TID) | ORAL | Status: DC
  Administered 2012-07-14: 25 mg via ORAL
  Filled 2012-07-14 (×4): qty 1

## 2012-07-14 MED ORDER — HEPARIN (PORCINE) IN NACL 100-0.45 UNIT/ML-% IJ SOLN
1500.0000 [IU]/h | INTRAMUSCULAR | Status: DC
  Administered 2012-07-14 (×2): 1100 [IU]/h via INTRAVENOUS
  Filled 2012-07-14 (×2): qty 250

## 2012-07-14 MED ORDER — METOPROLOL TARTRATE 25 MG PO TABS
25.0000 mg | ORAL_TABLET | Freq: Two times a day (BID) | ORAL | Status: DC
  Filled 2012-07-14: qty 1

## 2012-07-14 NOTE — Progress Notes (Signed)
[facility] PROGRESS NOTE  [name] ZOX:[id number] DOB: 07-Aug-1955 DOA: 07/12/2012 PCP: [name]., NP  Assessment/Plan: Neutropenic fever  -Continue neutropenic precautions.  -Continue broad-spectrum antibiotics including vancomycin and Zosyn as she does have a Port-A-Cath in place.  -Followup blood and urine cultures.  -Followup stool cultures. C. difficile PCR negative. Start on Lomotil.  - D/C Neupogen   A fib with RVR -wean off cardizem gtt -increase metoprolol -appreciate cards assistance   Anorexia / Severe malnutrition in the context of acute illness / Obesity  -Patient requests something to stimulate her appetite. Low dose Remeron started.  -Dietician consult.  -Add Ensure.   Hypokalemia  -Potassium added to IV fluids. We'll give 5 potassium runs today. Magnesium OK.  -Secondary to ongoing GI losses/diarrhea. Lomotil to slow diarrhea.   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   HYPOTHYROIDISM  -Continue home dose of Synthroid.   DIABETES MELLITUS, TYPE II  -Check CBGs q. a.c. and cover with sliding scale insulin, sensitive scale. CBGs 101-121.  -Continue Onglyza.   HYPERLIPIDEMIA  -Continue Lipitor.   DEPRESSION  -Continue Wellbutrin and Lexapro. Ativan as needed for breakthrough anxiety.   OBSTRUCTIVE SLEEP APNEA  -Continue CPAP each bedtime per home settings .  HYPERTENSION  -Continue Norvasc.   GERD  -Continue PPI therapy.   DVT, HX OF  -heparin gtt now per cardiology  Liver disease, chronic, with cirrhosis  -LFTs currently stable.   Cancer of breast  -Status post recent chemotherapy. Dr. [name] is out of town, can follow up with him post discharge.   Microcytic anemia / Thrombocytopenia  -Likely the sequela of recent chemotherapy. Monitor blood counts closely. Monitor for signs of bleeding. Transfuse as needed for hemoglobin less than 7.      Code Status: full Family Communication: patient at  bedside Disposition Plan:    Consultants:  cardiology  Procedures:  none  Antibiotics:    HPI/Subjective: C/o itching on chest No SOB No racing heart  Objective: Filed Vitals:   07/14/12 0028 07/14/12 0202 07/14/12 0541 07/14/12 0836  BP: 109/74 109/75 116/62   Pulse: 149 130 113 165  Temp:  99.8 F (37.7 C) 98.5 F (36.9 C)   TempSrc:  Oral Oral   Resp: 18 20 19    Height:      Weight:      SpO2: 98% 98% 97%     Intake/Output Summary (Last 24 hours) at 07/14/12 0923 Last data filed at 07/14/12 0700  Gross per 24 hour  Intake   3916 ml  Output    404 ml  Net   3512 ml   Filed Weights   07/12/12 1502 07/13/12 2340  Weight: 86.3 kg (190 lb 4.1 oz) 86.8 kg (191 lb 5.8 oz)    Exam:   General:  A+Ox3, NAD  Cardiovascular: irregular  Respiratory: clear anterior  Abdomen: +BS, soft  NT  Musculoskeletal: moves all 4 ext   Skin: mild red rash on anterior chest and below chin  Data Reviewed: Basic Metabolic Panel:  Recent Labs Lab 07/12/12 1111 07/13/12 0550 07/13/12 2353 07/14/12 0519  NA 129* 130* 136 137  K 3.1* 3.0* 3.3* 3.8  CL 94* 97 102 104  CO2 23 25 24 22   GLUCOSE 153* 109* 109* 118*  BUN 8 5* 5* 7  CREATININE 0.51 0.47* 0.83 1.00  CALCIUM 8.3* 7.5* 7.8* 7.9*  MG  --  1.7  --   --    Liver Function Tests:  Recent Labs Lab  07/12/12 1111 07/13/12 0550  AST 35 20  ALT 57* 36*  ALKPHOS 143* 111  BILITOT 1.2 0.6  PROT 6.4 5.3*  ALBUMIN 3.2* 2.5*   No results found for this basename: LIPASE, AMYLASE,  in the last 168 hours No results found for this basename: AMMONIA,  in the last 168 hours CBC:  Recent Labs Lab 07/12/12 1111 07/13/12 0550 07/13/12 2353  WBC 0.8* 2.7* 7.5  NEUTROABS  --  1.3* 5.5  HGB 11.7* 10.7* 11.7*  HCT 35.7* 32.0* 35.1*  MCV 76.4* 76.2* 75.6*  PLT 108* 90* 112*   Cardiac Enzymes:  Recent Labs Lab 07/13/12 2353 07/14/12 0519  TROPONINI <0.30 <0.30   BNP (last 3 results) No results found  for this basename: PROBNP,  in the last 8760 hours CBG:  Recent Labs Lab 07/13/12 0740 07/13/12 1118 07/13/12 1627 07/13/12 2202 07/14/12 0802  GLUCAP 121* 93 126* 123* 133*    Recent Results (from the past 240 hour(s))  CULTURE, BLOOD (ROUTINE X 2)     Status: None   Collection Time    07/12/12 11:45 AM      Result Value Range Status   Specimen Description BLOOD LEFT FOREARM   Final   Special Requests BOTTLES DRAWN AEROBIC AND ANAEROBIC   Final   Culture  Setup Time 07/12/2012 15:29   Final   Culture     Final   Value:        BLOOD CULTURE RECEIVED NO GROWTH TO DATE CULTURE WILL BE HELD FOR 5 DAYS BEFORE ISSUING A FINAL NEGATIVE REPORT   Report Status PENDING   Incomplete  CULTURE, BLOOD (ROUTINE X 2)     Status: None   Collection Time    07/12/12 11:51 AM      Result Value Range Status   Specimen Description BLOOD EFT HAND   Final   Special Requests BOTTLES DRAWN AEROBIC AND ANAEROBIC   Final   Culture  Setup Time 07/12/2012 15:29   Final   Culture     Final   Value:        BLOOD CULTURE RECEIVED NO GROWTH TO DATE CULTURE WILL BE HELD FOR 5 DAYS BEFORE ISSUING A FINAL NEGATIVE REPORT   Report Status PENDING   Incomplete  CLOSTRIDIUM DIFFICILE BY PCR     Status: None   Collection Time    07/12/12  6:30 PM      Result Value Range Status   C difficile by pcr NEGATIVE  NEGATIVE Final  STOOL CULTURE     Status: None   Collection Time    07/12/12  6:33 PM      Result Value Range Status   Specimen Description STOOL   Final   Special Requests Immunocompromised   Final   Culture Culture reincubated for better growth   Final   Report Status PENDING   Incomplete     Studies: Dg Chest 2 View  07/12/2012  *RADIOLOGY REPORT*  Clinical Data: Fever.  Breast cancer  CHEST - 2 VIEW  Comparison: 06/08/2012  Findings: Lungs are clear without evidence of pneumonia.  No heart failure or effusion.  Negative for mass lesion.  Port-A-Cath tip in the SVC.  IMPRESSION: No acute  abnormality.   Original Report Authenticated By: [name], M.D.     Scheduled Meds: . aspirin EC  81 mg Oral q morning - 10a  . atorvastatin  10 mg Oral QHS  . buPROPion  150 mg Oral q morning -  10a  . diphenoxylate-atropine  1 tablet Oral QID  . enoxaparin (LOVENOX) injection  40 mg Subcutaneous Q24H  . escitalopram  10 mg Oral QHS  . feeding supplement  1 Container Oral BID BM  . insulin aspart  0-9 Units Subcutaneous TID WC  . levothyroxine  125 mcg Oral QAC breakfast  . linagliptin  5 mg Oral Daily  . metoprolol tartrate  12.5 mg Oral BID  . mirtazapine  7.5 mg Oral QHS  . pantoprazole  40 mg Oral Daily  . piperacillin-tazobactam (ZOSYN)  IV  3.375 g Intravenous Q8H  . vancomycin  1,000 mg Intravenous Q8H   Continuous Infusions: . 0.9 % NaCl with KCl 40 mEq / L 100 mL/hr at 07/14/12 0215  . diltiazem (CARDIZEM) infusion 15 mg/hr (07/14/12 0415)    Principal Problem:   Neutropenic fever Active Problems:   HYPOTHYROIDISM   DIABETES MELLITUS, TYPE II   HYPERLIPIDEMIA   DEPRESSION   OBSTRUCTIVE SLEEP APNEA   HYPERTENSION   GERD   DVT, HX OF   Liver disease, chronic, with cirrhosis   Cancer of breast   Microcytic anemia   Thrombocytopenia   Hypokalemia   Hyponatremia   Obesity, unspecified   Severe malnutrition in the context of acute illness    Time spent: 35    [facility], JESSICA  [facility] Pager [phone]. If 7PM-7AM, please contact night-coverage at [email or web address], password [facility] 07/14/2012, 9:23 AM  LOS: 2 days

## 2012-07-14 NOTE — Progress Notes (Addendum)
Update: HR still in the 130-150 range. RN titrated gtt to 15mg /hr just now. Pt conts to be asymptomatic and other VSS. Labs reviewed-CBC looking better, BMP OK except K still slightly low-kdur x 1 ordered. 1st trop neg. Last CXR normal 3/3. TSH, T4 free pending. Will start po metoprolol now. Cont to monitor. If HR still not coming down after Cardizem titration, will consider amiodorone addition, cardio consult, and TF to SDU.  Jimmye Norman, NP [facility]

## 2012-07-14 NOTE — Progress Notes (Signed)
RT checked on patient who was already wearing her CPAP. She had on her home nasal pillows. Stated she was comfortable and did not need anything at this time. RT encouraged her to call if she needed any assistance.

## 2012-07-14 NOTE — Progress Notes (Signed)
ANTICOAGULATION CONSULT NOTE - Initial Consult  Pharmacy Consult for IV heparin Indication: atrial fibrillation  Allergies  Allergen Reactions  . Olmesartan Medoxomil     REACTION: ? if cough  . Tetracycline Hcl     REACTION: unspecified  . Venlafaxine     REACTION: severe dry moouth  . Adhesive (Tape) Rash    Patient Measurements: Height: 5\' 4"  (162.6 cm) Weight: 191 lb 5.8 oz (86.8 kg) IBW/kg (Calculated) : 54.7 Heparin Dosing Weight: 74 kg  Vital Signs: Temp: 98.5 F (36.9 C) (03/05 0541) Temp src: Oral (03/05 0541) BP: 116/62 mmHg (03/05 0541) Pulse Rate: 165 (03/05 0836)  Labs:  Recent Labs  07/12/12 1111 07/13/12 0550 07/13/12 2353 07/14/12 0519  HGB 11.7* 10.7* 11.7*  --   HCT 35.7* 32.0* 35.1*  --   PLT 108* 90* 112*  --   CREATININE 0.51 0.47* 0.83 1.00  TROPONINI  --   --  <0.30 <0.30    Estimated Creatinine Clearance: 65.3 ml/min (by C-G formula based on Cr of 1).   Medical History: Past Medical History  Diagnosis Date  . Depression   . DVT (deep venous thrombosis)     hx of on HRT left leg ~2006  . GERD (gastroesophageal reflux disease)   . Hyperlipidemia   . Hypertension   . Hypothyroidism   . PPD positive, treated     rx inh   . OSA on CPAP   . Diabetes mellitus   . 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 Breast cancer     a. 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  Patient found with new onset afib with RVR 3/4.  Cardiology on board, plan short term IV heparin (Dr.Nishan unsure if pt is an ideal candidate for long-term anticoagulation given low counts and need for chemotherapy).    Plts 112K (low 2/2 chemo), Hgb 11.7 (stable).  No baseline INR or aPTT.    Patient  received Lovenox 40 mg last on 3/4 @ 2127  Goal of Therapy:  Heparin level 0.3-0.7 units/ml Monitor platelets by anticoagulation protocol: Yes   Plan:   STAT baseline PT/INR and APTT   D/C Lovenox order  IV heparin 2000 units x 1 as bolus then 1100 units/hr  Heparin level at 1800  Daily heparin level and CBC   Geoffry Paradise, PharmD, BCPS Pager: [phone] 10:31 AM Pharmacy #: [id number]

## 2012-07-14 NOTE — Progress Notes (Signed)
Echocardiogram 2D Echocardiogram has been performed.  07/14/2012 3:25 PM Gertie Fey, RDMS, RDCS

## 2012-07-14 NOTE — Progress Notes (Signed)
Patient received from 3rd floor on a bed, awake, alert, oriented.  Placed on telemetry monitor, HR 140-160's irregular, atrial fib, asymptomatic. No shortness of breath, no complaints of chest pain. Started on cardizem drip, and bolus given. Will continue to monitor patient.

## 2012-07-14 NOTE — Progress Notes (Signed)
Patient's HR is running between 110s-130s, atrial fib. Frequent short v.tachs noted 4-6 runs. Patient remained to be asymptomatic. With ongoing cardizem drip increased to 15mg /hr. Donnamarie Poag NP was made aware of patient's progress. Will continue to monitor patient.

## 2012-07-14 NOTE — Consult Note (Signed)
CARDIOLOGY CONSULT NOTE  Patient ID: [name], MRN: [id number], DOB/AGE: 1955/11/16 57 y.o. 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 [name] was seen by Dr. Elease Hashimoto 08/2011 for hypertension and sinus tach with normal EF by echo at that time. [name] is s/p mastectomy 06/08/12 who underwent her first chemo treatment on 07/06/12 with docetaxel/carboplatin followed by Neulasta injection the following day. [name] developed subsequent diarrhea with up to 20-30 stools per day without associated melena or hematochezia. [name] also had a fever of 100.4 and 1-2 day history of nonproductive cough. [name] was diagnosed with neutropenic fever with WBC 0.8. Also had lyte abnormalities of hyponatremia, hypokalemia. [name] was placed on broad-spectrum antibtiotics, cultures were obtained, and [name] was placed on neupogen. Yesterday evening [name] was noted to be tachycardic and EKG showed rapid afib for which [name] was asymptomatic. No CP, SOB, or palpitations.  [name] was given IV metoprolol and placed on cardizem gtt. [name] has also been noted to be having PVCs and runs of NSVT 3-8 beats.[name] denies any syncope or orthopnea. Diarrhea is still present, but much less (3 stools today so far).  Past Medical History  Diagnosis Date  . Depression   . DVT (deep venous thrombosis)     hx of on HRT left leg ~5 yr ago  . GERD (gastroesophageal reflux disease)   . Hyperlipidemia   . Hypertension   . Hypothyroidism   . PPD positive, treated     rx inh   . OSA on CPAP   . Diabetes mellitus   .  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 Breast cancer     a. 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 Wall thickness was normal. Systolic function was normal. The estimated ejection fraction was in the range of 55% to 60%. Wall motion was normal; there were no regional wall motion abnormalities. Left ventricular diastolic function parameters were normal. - Left atrium: The atrium was mildly dilated.    Surgical History:  Past Surgical History  Procedure Laterality Date  . Tubal ligation    . Cholecystectomy    . Foot surgery    . Eye surgery    . Cataract extraction    . Percutaneous liver biopsy    . Breast biopsy      left breast  . Mastectomy modified radical  06/08/2012    Procedure: MASTECTOMY MODIFIED RADICAL;  Surgeon: Mariella Saa, MD;  Location: [facility] OR;  Service: General;  Laterality: Right;  . Portacath placement  06/08/2012    Procedure: INSERTION PORT-A-CATH;  Surgeon: Mariella Saa, MD;  Location: [facility] OR;  Service: General;  Laterality: Left;     Home Meds: Prior to Admission medications   Medication Sig Start Date End Date Taking? Authorizing Provider  ALPRAZolam (XANAX) 0.25 MG tablet Take 1 tablet (0.25 mg total)  by mouth 3 (three) times daily as needed. 05/24/12  Yes Sandford Craze, NP  amLODipine (NORVASC) 5 MG tablet Take 5 mg by mouth every morning.   Yes Historical Provider, MD  aspirin EC 81 MG tablet Take 81 mg by mouth every morning.   Yes Historical Provider, MD  atorvastatin (LIPITOR) 10 MG tablet Take 10 mg by mouth at bedtime.  10/14/11  Yes Madelin Headings, MD  buPROPion (WELLBUTRIN SR) 150 MG 12 hr tablet Take 150 mg by mouth every morning.    Yes Historical Provider, MD  dexamethasone (DECADRON) 4 MG tablet  Take 4 mg by mouth. 2 tabs PO BID on day before and 2 days after chemo. 2 tabs PO in am only on day 4. 07/01/12  Yes Amy G Berry, PA-C  escitalopram (LEXAPRO) 10 MG tablet Take 10 mg by mouth at bedtime. 06/29/12  Yes Sandford Craze, NP  glucose blood (TRUETEST TEST) test strip Use as instructed 04/12/12  Yes Madelin Headings, MD  levothyroxine (SYNTHROID, LEVOTHROID) 125 MCG tablet Take 125 mcg by mouth daily before breakfast. 03/31/12  Yes Madelin Headings, MD  lidocaine-prilocaine (EMLA) cream Apply topically as needed. 07/01/12  Yes Amy Allegra Grana, PA-C  LORazepam (ATIVAN) 0.5 MG tablet 1 tab up to twice daily PRN anxiety, insomnia and/or nausea 07/01/12  Yes Amy G Berry, PA-C  omeprazole (PRILOSEC) 20 MG capsule Take 1 capsule (20 mg total) by mouth 2 (two) times daily. 11/18/11  Yes Madelin Headings, MD  prochlorperazine (COMPAZINE) 10 MG tablet 1 tab PO ACHS x 3 days after chemo, then 1 tab PO q 6 hr PRN nausea 07/01/12  Yes Amy G Berry, PA-C  promethazine (PHENERGAN) 12.5 MG tablet Take 1 tablet (12.5 mg total) by mouth every 4 (four) hours as needed for nausea. 06/29/12  Yes Mariella Saa, MD  saxagliptin HCl (ONGLYZA) 5 MG TABS tablet Take 5 mg by mouth every morning. 06/29/12  Yes Sandford Craze, NP    Inpatient Medications:  . aspirin EC  81 mg Oral q morning - 10a  . atorvastatin  10 mg Oral QHS  . buPROPion  150 mg Oral q morning - 10a  . diphenoxylate-atropine  1 tablet Oral QID  . enoxaparin (LOVENOX) injection  40 mg Subcutaneous Q24H  . escitalopram  10 mg Oral QHS  . feeding supplement  1 Container Oral BID BM  . insulin aspart  0-9 Units Subcutaneous TID WC  . levothyroxine  125 mcg Oral QAC breakfast  . linagliptin  5 mg Oral Daily  . metoprolol tartrate  12.5 mg Oral BID  . mirtazapine  7.5 mg Oral QHS  . pantoprazole  40 mg Oral Daily  . piperacillin-tazobactam (ZOSYN)  IV  3.375 g Intravenous Q8H  . vancomycin  1,000 mg Intravenous Q8H    Allergies:  Allergies    Allergen Reactions  . Olmesartan Medoxomil     REACTION: ? if cough  . Tetracycline Hcl     REACTION: unspecified  . Venlafaxine     REACTION: severe dry moouth  . Adhesive (Tape) Rash    History   Social History  . Marital Status: Married    Spouse Name: N/A    Number of Children: 2  . Years of Education: N/A   Occupational History  . SECRETARY [employer]   Social History Main Topics  . Smoking status: Former Games developer  . Smokeless tobacco: Never Used  . Alcohol Use: No  . Drug Use: No  .  Sexually Active: No   Other Topics Concern  . Not on file   Social History Narrative   Married   Works at [employer] secretary   Daily caffeine use - 2 cups a day plus a couple of sodas a day   Pt doesn't exercise regularly   G2P2   H H of 5 soon to be 4 .      Family History  Problem Relation Age of Onset  . Diabetes Mother   . Hypertension Mother   . Arthritis Mother   . Heart disease Mother   . Heart failure Mother   . Other Mother     benign breast mass  . Stroke Father   . Heart disease Father   . Diabetes Paternal Grandmother   . Colon cancer Paternal Grandfather      Review of Systems: General: see above. Poor appetite in general  Cardiovascular:see above. No CP, palpitations, SOB. Occasional LEE when sitting for long periods of time. Dermatological: negative for rash Respiratory: negative for cough or wheezing Urologic: negative for hematuria Abdominal: negative for nausea, vomiting, bright red blood per rectum, melena, or hematemesis but has been having diarrhea Neurologic: negative for visual changes, syncope, or dizziness All other systems reviewed and are otherwise negative except as noted above.  Labs:  Recent Labs  07/13/12 2353 07/14/12 0519  TROPONINI <0.30 <0.30   Lab Results  Component Value Date   WBC 7.5 07/13/2012   HGB 11.7* 07/13/2012   HCT 35.1* 07/13/2012   MCV 75.6* 07/13/2012   PLT 112* 07/13/2012    Recent Labs Lab  07/13/12 0550  07/14/12 0519  NA 130*  < > 137  K 3.0*  < > 3.8  CL 97  < > 104  CO2 25  < > 22  BUN 5*  < > 7  CREATININE 0.47*  < > 1.00  CALCIUM 7.5*  < > 7.9*  PROT 5.3*  --   --   BILITOT 0.6  --   --   ALKPHOS 111  --   --   ALT 36*  --   --   AST 20  --   --   GLUCOSE 109*  < > 118*  < > = values in this interval not displayed. Lab Results  Component Value Date   CHOL 168 10/14/2011   HDL 48.70 10/14/2011   LDLCALC 101* 10/14/2011   TRIG 93.0 10/14/2011   Radiology/Studies:  Dg Chest 2 View 07/12/2012  *RADIOLOGY REPORT*  Clinical Data: Fever.  Breast cancer  CHEST - 2 VIEW  Comparison: 06/08/2012  Findings: Lungs are clear without evidence of pneumonia.  No heart failure or effusion.  Negative for mass lesion.  Port-A-Cath tip in the SVC.  IMPRESSION: No acute abnormality.   Original Report Authenticated By: [name], M.D.    Nm Pet Image Initial (pi) Skull Base To Thigh 07/05/2012  *RADIOLOGY REPORT*  Clinical Data: Initial treatment strategy for right breast carcinoma.  NUCLEAR MEDICINE PET SKULL BASE TO THIGH  Fasting Blood Glucose:  100  Technique:  18.8 mCi F-18 FDG was injected intravenously. CT data was obtained and used for attenuation correction and anatomic localization only.  (This was not acquired as a diagnostic CT examination.) Additional exam technical data entered on technologist worksheet.  Comparison:  None  Findings:  Neck: No hypermetabolic lymph nodes in the neck.  Chest:  No hypermetabolic mediastinal or hilar nodes.  No hypermetabolic axillary lymph nodes identified.  Muscular  activity noted in left shoulder girdle.  No suspicious pulmonary nodules on the CT scan.  Abdomen/Pelvis:  No abnormal hypermetabolic activity within the liver, pancreas, adrenal glands, or spleen.  No hypermetabolic lymph nodes in the abdomen or pelvis.  Incidental findings noted on noncontrast CT or hepatic cirrhosis and mild splenomegaly, consistent with portal venous hypertension.  No  evidence of ascites.  Several small uterine fibroids are also noted, at least one showing metabolic activity.  Skeleton:  No focal hypermetabolic activity to suggest skeletal metastasis.  IMPRESSION:  1.  No metabolically active malignancy identified. 2.  Incidental findings include hepatic cirrhosis, mild splenomegaly, and small uterine fibroids.   Original Report Authenticated By: Myles Rosenthal, M.D.    EKG: atrial fib 168bpm with lateral ST-T changes  Physical Exam: Blood pressure 116/62, pulse 165, temperature 98.5 F (36.9 C), temperature source Oral, resp. rate 19, height 5\' 4"  (1.626 m), weight 191 lb 5.8 oz (86.8 kg), SpO2 97.00%. General: Well developed WF in no acute distress, sallow complexion Head: Normocephalic, atraumatic, sclera non-icteric, no xanthomas, nares are without discharge.  Neck: Negative for carotid bruits. JVD not elevated. Lungs: Clear bilaterally to auscultation without wheezes, rales, or rhonchi. Breathing is unlabored. Heart: Irregularly irregular, mildly tachycardic, with S1 S2. No murmurs, rubs, or gallops appreciated. Abdomen: Soft, non-tender, non-distended with normoactive bowel sounds. No hepatomegaly. No rebound/guarding. No obvious abdominal masses. Msk:  Strength and tone appear normal for age. Extremities: No clubbing or cyanosis. No edema.  Distal pedal pulses are 2+ and equal bilaterally. Skin: macropapular rash on anterior chest noted (primary team has also noted) Neuro: Alert and oriented X 3. No facial asymmetry. No focal deficit. Moves all extremities spontaneously. Psych:  Responds to questions appropriately with a normal affect.   Assessment and Plan:   1. Neutropenic fever with diarrhea 2. Atrial fibrillation with RVR, newly recognized 3. NSVT 4. Breast CA s/p R mastectomy with lymph node involvement, recently completed first docetaxel/carboplatin treatment 5. Diabetes mellitus 6. HTN, controlled 7. Remote DVT 2006 while on HRT  Patient  seen and examined with Dr. Eden Emms. Plan to titrate metoprolol up to 25mg  q8hr (can push further if needed) and turn diltiazem drip off this AM in order to help consolidate medications. Will use IV heparin short term but Dr. Eden Emms is unsure [name] is ideal candidate for long-term anticoagulation at present (see thoughts below). Check 2D echocardiogram. Primary team is managing electrolytes. Would aim to keep K>4, Mg >2. Will check mag level. TSH and free T4 pending. Will follow with you.  Signed, Dayna Dunn PA-C 07/14/2012, 9:54 AM  Hopefully PAF will resolve with beta blockers and improvement in electrolyte abnormalities and diarhea.  [name] is not a great candidate for anticoagulation with ongoing low counts and need for chemo D/C calcium blocker and use q 8 hour beta blocker.  Short term heparin for now.  Echo is pending  Charlton Haws

## 2012-07-14 NOTE — Progress Notes (Signed)
Update: Pt's HR in the one teen's now. Still asymptomatic with normal BP. Will cont Cardizem gtt. Will hold on cardio consult for now and report to oncoming attending. 2nd/3rd trops pending. KJKG, NP

## 2012-07-14 NOTE — Progress Notes (Signed)
Pt had 14 beats of VTach. Strip put in Epic, per CMT. Pt asymptomatic. Notified MD of event. Will continue to monitor pt. - Christell Faith, RN

## 2012-07-14 NOTE — Progress Notes (Signed)
Paged Md regarding increase in patients heart rate while on cardizem drip at 15 ml/hr.  Received order to give beta blocker now.  Will continue to monitor.

## 2012-07-14 NOTE — Progress Notes (Signed)
At around 2230pm [name]'s vital signs were obtained and she had a heart rate of around 160's-170's on the dinamap monitor.  Patient's pulse was checked manually and it was around 130 bpm but very irregular.  Attending K.Kirby on call was notified.  Orders for EKG, Metoprolol 5mg  IV, and Telemetry were received.  Patient was transferred to the 4th floor room 1442 at around 2330pm.  Cardizem drip was started immediately upon arrival to tele floor.  Patient had no complaints and was asymptomatic.[name]

## 2012-07-14 NOTE — Progress Notes (Signed)
ANTICOAGULATION CONSULT NOTE - Follow up  Pharmacy Consult for IV heparin Indication: atrial fibrillation  Allergies  Allergen Reactions  . Olmesartan Medoxomil     REACTION: ? if cough  . Tetracycline Hcl     REACTION: unspecified  . Venlafaxine     REACTION: severe dry moouth  . Adhesive (Tape) Rash   Patient Measurements: Height: 5\' 4"  (162.6 cm) Weight: 191 lb 5.8 oz (86.8 kg) IBW/kg (Calculated) : 54.7 Heparin Dosing Weight: 74 kg  Vital Signs: Temp: 97.5 F (36.4 C) (03/05 1409) Temp src: Oral (03/05 1409) BP: 99/54 mmHg (03/05 1409) Pulse Rate: 137 (03/05 1409)  Labs:  Recent Labs  07/12/12 1111 07/13/12 0550 07/13/12 2353 07/14/12 0519 07/14/12 1100 07/14/12 1225 07/14/12 1753  HGB 11.7* 10.7* 11.7*  --   --   --   --   HCT 35.7* 32.0* 35.1*  --   --   --   --   PLT 108* 90* 112*  --   --   --   --   APTT  --   --   --   --  34  --   --   LABPROT  --   --   --   --  16.1*  --   --   INR  --   --   --   --  1.32  --   --   HEPARINUNFRC  --   --   --   --   --   --  <0.10*  CREATININE 0.51 0.47* 0.83 1.00  --   --   --   TROPONINI  --   --  <0.30 <0.30  --  <0.30  --     Estimated Creatinine Clearance: 65.3 ml/min (by C-G formula based on Cr of 1).  Assessment:  43 yof with h/o remote DVT, recently diagnosed with breast cancer, undergoing chemotherapy, admitted for neutropenic fever.  Patient found to have new-onset afib with RVR on 3/4.  Cardiology on board, plan short term IV heparin (Dr.Nishan unsure if pt is an ideal candidate for long-term anticoagulation given low counts and need for chemotherapy).    Baseline aPTT wnl. INR a little elevated.   Heparin level undetectable on 1100 units/hr. No bleeding reported/documented.  Goal of Therapy:  Heparin level 0.3-0.7 units/ml Monitor platelets by anticoagulation protocol: Yes   Plan:   Increase heparin to 1500 units/hr.  Recheck heparin level in ~ 8 hrs.  Daily heparin level and CBC.  Charolotte Eke, PharmD, pager [phone]. 07/14/2012,7:39 PM.

## 2012-07-15 DIAGNOSIS — E669 Obesity, unspecified: Secondary | ICD-10-CM

## 2012-07-15 DIAGNOSIS — G4733 Obstructive sleep apnea (adult) (pediatric): Secondary | ICD-10-CM

## 2012-07-15 LAB — BASIC METABOLIC PANEL
BUN: 8 mg/dL (ref 6–23)
Creatinine, Ser: 1.59 mg/dL — ABNORMAL HIGH (ref 0.50–1.10)
GFR calc Af Amer: 40 mL/min — ABNORMAL LOW (ref >90)
GFR calc non Af Amer: 35 mL/min — ABNORMAL LOW (ref >90)
Glucose, Bld: 113 mg/dL — ABNORMAL HIGH (ref 70–99)
Potassium: 3.6 mEq/L (ref 3.5–5.1)

## 2012-07-15 LAB — CBC WITH DIFFERENTIAL/PLATELET
Basophils Absolute: 0.1 10*3/uL (ref 0.0–0.1)
HCT: 34.9 % — ABNORMAL LOW (ref 36.0–46.0)
Lymphs Abs: 1.5 10*3/uL (ref 0.7–4.0)
MCH: 25.1 pg — ABNORMAL LOW (ref 26.0–34.0)
MCHC: 32.7 g/dL (ref 30.0–36.0)
MCV: 76.7 fL — ABNORMAL LOW (ref 78.0–100.0)
Monocytes Absolute: 1 10*3/uL (ref 0.1–1.0)
Monocytes Relative: 9 % (ref 3–12)
Neutro Abs: 8.8 10*3/uL — ABNORMAL HIGH (ref 1.7–7.7)
Platelets: 131 10*3/uL — ABNORMAL LOW (ref 150–400)
RDW: 15.3 % (ref 11.5–15.5)

## 2012-07-15 LAB — GLUCOSE, CAPILLARY
Glucose-Capillary: 118 mg/dL — ABNORMAL HIGH (ref 70–99)
Glucose-Capillary: 142 mg/dL — ABNORMAL HIGH (ref 70–99)

## 2012-07-15 LAB — HEPARIN LEVEL (UNFRACTIONATED): Heparin Unfractionated: 0.43 IU/mL (ref 0.30–0.70)

## 2012-07-15 MED ORDER — METOPROLOL SUCCINATE ER 50 MG PO TB24
50.0000 mg | ORAL_TABLET | Freq: Two times a day (BID) | ORAL | Status: DC
  Administered 2012-07-15 (×2): 50 mg via ORAL
  Filled 2012-07-15 (×5): qty 1

## 2012-07-15 MED ORDER — DILTIAZEM HCL 30 MG PO TABS
30.0000 mg | ORAL_TABLET | Freq: Four times a day (QID) | ORAL | Status: DC
  Administered 2012-07-15 – 2012-07-16 (×5): 30 mg via ORAL
  Filled 2012-07-15 (×13): qty 1

## 2012-07-15 MED ORDER — HEPARIN (PORCINE) IN NACL 100-0.45 UNIT/ML-% IJ SOLN
1700.0000 [IU]/h | INTRAMUSCULAR | Status: DC
  Administered 2012-07-15 – 2012-07-18 (×5): 1700 [IU]/h via INTRAVENOUS
  Filled 2012-07-15 (×10): qty 250

## 2012-07-15 NOTE — Progress Notes (Signed)
Pt still having occasional 5-beat runs of VTach. Pt has been sleeping and asymptomatic on assessment. Have notified MD of events. No new orders from MD. Will continue to monitor pt. - Christell Faith, RN

## 2012-07-15 NOTE — Progress Notes (Signed)
ANTICOAGULATION CONSULT NOTE - Follow Up Consult  Pharmacy Consult for Heparin Indication: atrial fibrillation  Allergies  Allergen Reactions  . Olmesartan Medoxomil     REACTION: ? if cough  . Tetracycline Hcl     REACTION: unspecified  . Venlafaxine     REACTION: severe dry moouth  . Adhesive (Tape) Rash    Patient Measurements: Height: 5\' 4"  (162.6 cm) Weight: 191 lb 5.8 oz (86.8 kg) IBW/kg (Calculated) : 54.7 Heparin Dosing Weight:   Vital Signs: Temp: 98.1 F (36.7 C) (03/06 0500) Temp src: Oral (03/06 0500) BP: 119/79 mmHg (03/06 0500) Pulse Rate: 130 (03/06 0500)  Labs:  Recent Labs  07/13/12 0550 07/13/12 2353 07/14/12 0519 07/14/12 1100 07/14/12 1225 07/14/12 1753 07/15/12 0615  HGB 10.7* 11.7*  --   --   --   --  11.4*  HCT 32.0* 35.1*  --   --   --   --  34.9*  PLT 90* 112*  --   --   --   --  131*  APTT  --   --   --  34  --   --   --   LABPROT  --   --   --  16.1*  --   --   --   INR  --   --   --  1.32  --   --   --   HEPARINUNFRC  --   --   --   --   --  <0.10* 0.13*  CREATININE 0.47* 0.83 1.00  --   --   --   --   TROPONINI  --  <0.30 <0.30  --  <0.30  --   --     Estimated Creatinine Clearance: 65.3 ml/min (by C-G formula based on Cr of 1).   Medications:  Infusions:  . 0.9 % NaCl with KCl 40 mEq / L 100 mL/hr at 07/15/12 0128  . heparin 1,700 Units/hr (07/15/12 0702)  . [DISCONTINUED] diltiazem (CARDIZEM) infusion 15 mg/hr (07/14/12 0415)  . [DISCONTINUED] heparin 1,500 Units/hr (07/14/12 2003)    Assessment: Patient with low heparin level again.  No issues per RN.  Goal of Therapy:  Heparin level 0.3-0.7 units/ml Monitor platelets by anticoagulation protocol: Yes   Plan:  Increase to 1700 units/hr heparin.  Recheck level at [address], [place] 07/15/2012,7:02 AM

## 2012-07-15 NOTE — Progress Notes (Signed)
Getting intermittent texts from CMT concerning pt having occasional 5 to 6-beat runs of VTach. Admitting MD and Cardiologist aware. Pt has been asymptomatic on assessment each time. Notified MD on-call of events. Will continue to monitor pt. - Christell Faith, RN

## 2012-07-15 NOTE — Progress Notes (Signed)
ANTIBIOTIC CONSULT NOTE - FOLLOW UP  Pharmacy Consult for Vanc, Zosyn Indication: neutropenic fever  Allergies  Allergen Reactions  . Olmesartan Medoxomil     REACTION: ? if cough  . Tetracycline Hcl     REACTION: unspecified  . Venlafaxine     REACTION: severe dry moouth  . Adhesive (Tape) Rash    Patient Measurements: Height: 5\' 4"  (162.6 cm) Weight: 191 lb 5.8 oz (86.8 kg) IBW/kg (Calculated) : 54.7  Vital Signs: Temp: 98.1 F (36.7 C) (03/06 0500) Temp src: Oral (03/06 0500) BP: 119/79 mmHg (03/06 0500) Pulse Rate: 130 (03/06 0500) Intake/Output from previous day: 03/05 0701 - 03/06 0700 In: 2381.7 [P.O.:240; I.V.:1891.7; IV Piggyback:250] Out: 901 [Urine:900; Stool:1] Intake/Output from this shift:    Labs:  Recent Labs  07/13/12 0550 07/13/12 2353 07/14/12 0519 07/15/12 0615  WBC 2.7* 7.5  --  11.4*  HGB 10.7* 11.7*  --  11.4*  PLT 90* 112*  --  131*  CREATININE 0.47* 0.83 1.00 1.59*   Estimated Creatinine Clearance: 41.1 ml/min (by C-G formula based on Cr of 1.59).  Recent Labs  07/15/12 0615  VANCOTROUGH 26.3*     Assessment: 53 yof with recently diagnosed invasive ductal breast carcinoma undergoing chemotherapy with docetaxel/carboplatin (last dose 2/25, Neulasta 2/26). Patient presented 3/3 with fever, diarrhea (20-30 BMs/day), and a non-productive cough. Patient with port-a-cath in place. WBC 0.8K (ANC 0). CXR with no acute abnormality  Today is Day#4 of Vanc and Zosyn.  Patient is afebrile, WBC up to 11.4K and ANC 8.8K (pt received 2 doses of Neupogen this admit).  Blood and stool cultures pending.  Cdiff CPR and UA was negative.    Scr rise from 0.51 on admit to 1.59 this morning for CG CrCl of 41 and normalized CrCl of 44 ml/min. Vancomycin trough drawn this morning was elevated at 26.3 as expected with rising Scr.  The next bag of Vancomycin was hung but stopped 15 minutes into the infusion (~250 mg Vanc infused).   Goal of Therapy:   Vancomycin trough level 15-20 mcg/ml Appropriate renal adjustment of Zosyn Eradication of infection  Plan:   HOLD Vancomycin, will repeat random Vancomycin level and Scr in AM  Continue Zosyn at 3.375 gm IV q8h extended infusion over 4 hours  Pharmacy will f/u  Geoffry Paradise, PharmD, BCPS Pager: [phone] 7:57 AM Pharmacy #: [phone]

## 2012-07-15 NOTE — Progress Notes (Signed)
Upon arrival pt wearing CPAP with home nasal pillows. Pt states is comfortable and doesn't need any further assistance. RN is aware. RT will assist as needed.

## 2012-07-15 NOTE — Progress Notes (Signed)
ANTICOAGULATION CONSULT NOTE - Follow Up Consult  Pharmacy Consult for IV heparin Indication: atrial fibrillation  Allergies  Allergen Reactions  . Olmesartan Medoxomil     REACTION: ? if cough  . Tetracycline Hcl     REACTION: unspecified  . Venlafaxine     REACTION: severe dry moouth  . Adhesive (Tape) Rash    Patient Measurements: Height: 5\' 4"  (162.6 cm) Weight: 191 lb 5.8 oz (86.8 kg) IBW/kg (Calculated) : 54.7 Heparin Dosing Weight: 74 kg  Vital Signs: Temp: 98.2 F (36.8 C) (03/06 1350) Temp src: Oral (03/06 0500) BP: 113/60 mmHg (03/06 1350) Pulse Rate: 106 (03/06 1350)  Labs:  Recent Labs  07/13/12 0550 07/13/12 2353 07/14/12 0519 07/14/12 1100 07/14/12 1225 07/14/12 1753 07/15/12 0615 07/15/12 1428  HGB 10.7* 11.7*  --   --   --   --  11.4*  --   HCT 32.0* 35.1*  --   --   --   --  34.9*  --   PLT 90* 112*  --   --   --   --  131*  --   APTT  --   --   --  34  --   --   --   --   LABPROT  --   --   --  16.1*  --   --   --   --   INR  --   --   --  1.32  --   --   --   --   HEPARINUNFRC  --   --   --   --   --  <0.10* 0.13* 0.43  CREATININE 0.47* 0.83 1.00  --   --   --  1.59*  --   TROPONINI  --  <0.30 <0.30  --  <0.30  --   --   --     Estimated Creatinine Clearance: 41.1 ml/min (by C-G formula based on Cr of 1.59).   Assessment: 22 yof with h/o remote DVT, recently diagnosed invasive ductal breast carcinoma undergoing chemotherapy here for neutropenic fever. Patient found with new onset afib with RVR 3/4. Cardiology on board, CHADSVASC = 3 (HTN, DM, gender) - IV heparin started 3/5.    IV heparin currently running at 1700 units/hr and heparin level returns therapeutic this afternoon at 0.43.  Pt remains in afib - Diltiazem, Toprol-XL, ASA. Cardiology will check with hem/onc regarding risk for thrombocytopenia to see if Eliquis or Xarelto if possible.  Plts 131K (improving), Hgb stable.  NO bleeding.   Pt unhappy with multiple sticks for blood  work so will keep IV heparin same and next heparin level will be with AM labs.    Goal of Therapy:  Heparin level 0.3-0.7 units/ml Monitor platelets by anticoagulation protocol: Yes   Plan:   Continue IV heparin at 1700 units/hr  Daily heparin level and CBC  Geoffry Paradise, PharmD, BCPS Pager: [phone] 3:12 PM Pharmacy #: [id number]

## 2012-07-15 NOTE — Progress Notes (Signed)
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 [name] states she is already active with [facility] for her diabetes. She sees the pharmacist with [facility]. Left contact information at bedside for patient.  Rhonda Noble, MSN-Ed, RN,BSN, [facility], [phone]

## 2012-07-15 NOTE — Progress Notes (Signed)
[facility] PROGRESS NOTE  [name] AVW:[id number] DOB: 1955-08-21 DOA: 07/12/2012 PCP: [name]., NP  Assessment/Plan: Neutropenic fever  -hold broad-spectrum antibiotics including vancomycin and Zosyn as BX NGTD and all other culture negative (day #5)  -Followup blood and urine cultures- negative already -Followup stool cultures. C. difficile PCR negative. Diarrhea almost resolved - D/C Neupogen   A fib with RVR - off cardizem gtt -increase metoprolol -heparin : per cards: would anticoagulate with Eliquis or Xarelto. However, she has been getting chemotherapy for breast cancer. Would check with her heme/onc specialist regarding how low her platelets are expected to drop with treatment => if will be very significant, would hold off on anticoagulation (will contact heme onc when back in office) -appreciate cards assistance   Anorexia / Severe malnutrition in the context of acute illness / Obesity  -Patient requests something to stimulate her appetite. Low dose Remeron started.  -Dietician consult.  -Add Ensure.   Hypokalemia  -Potassium added to IV fluids. We'll give 5 potassium runs today. Magnesium OK.  -Secondary to ongoing GI losses/diarrhea. Lomotil to slow diarrhea.   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   HYPOTHYROIDISM  -Continue home dose of Synthroid.   DIABETES MELLITUS, TYPE II  -Check CBGs q. a.c. and cover with sliding scale insulin, sensitive scale. CBGs controlled -Continue Onglyza.   HYPERLIPIDEMIA  -Continue Lipitor.   DEPRESSION  -Continue Wellbutrin and Lexapro. Ativan as needed for breakthrough anxiety.   OBSTRUCTIVE SLEEP APNEA  -Continue CPAP each bedtime per home settings .  HYPERTENSION  -Continue Norvasc.   GERD  -Continue PPI therapy.   DVT, HX OF  -heparin gtt now per cardiology  Liver disease, chronic, with cirrhosis  -LFTs currently stable.   Cancer of breast  -Status post recent  chemotherapy. Dr. [name] is out of town, can follow up with him post discharge.   Microcytic anemia / Thrombocytopenia  -Likely the sequela of recent chemotherapy. Monitor blood counts closely. Monitor for signs of bleeding. Transfuse as needed for hemoglobin less than 7.   AKI -hold vanc and nephrotoxic medications, recheck in AM -IVF- encourage PO   Code Status: full Family Communication: patient at bedside Disposition Plan:    Consultants:  cardiology  Procedures:  none  Antibiotics:    HPI/Subjective: feeling much better today No SOB, no CP   Objective: Filed Vitals:   07/14/12 1409 07/14/12 2139 07/14/12 2320 07/15/12 0500  BP: 99/54 123/64 115/61 119/79  Pulse: 137 101 140 130  Temp: 97.5 F (36.4 C) 98.5 F (36.9 C) 98.7 F (37.1 C) 98.1 F (36.7 C)  TempSrc: Oral Oral Oral Oral  Resp: 20 20 18 18   Height:      Weight:      SpO2: 98% 96% 97% 98%    Intake/Output Summary (Last 24 hours) at 07/15/12 1032 Last data filed at 07/14/12 2030  Gross per 24 hour  Intake 2381.67 ml  Output    901 ml  Net 1480.67 ml   Filed Weights   07/12/12 1502 07/13/12 2340  Weight: 86.3 kg (190 lb 4.1 oz) 86.8 kg (191 lb 5.8 oz)    Exam:   General:  A+Ox3, NAD  Cardiovascular: irregular  Respiratory: clear anterior  Abdomen: +BS, soft  NT  Musculoskeletal: moves all 4 ext    Data Reviewed: Basic Metabolic Panel:  Recent Labs Lab 07/12/12 1111 07/13/12 0550 07/13/12 2353 07/14/12 0519 07/14/12 1100 07/15/12 0615  NA 129* 130* 136 137  --  137  K 3.1* 3.0* 3.3* 3.8  --  3.6  CL 94* 97 102 104  --  107  CO2 23 25 24 22   --  21  GLUCOSE 153* 109* 109* 118*  --  113*  BUN 8 5* 5* 7  --  8  CREATININE 0.51 0.47* 0.83 1.00  --  1.59*  CALCIUM 8.3* 7.5* 7.8* 7.9*  --  7.5*  MG  --  1.7  --   --  2.0  --    Liver Function Tests:  Recent Labs Lab 07/12/12 1111 07/13/12 0550  AST 35 20  ALT 57* 36*  ALKPHOS 143* 111  BILITOT 1.2 0.6   PROT 6.4 5.3*  ALBUMIN 3.2* 2.5*   No results found for this basename: LIPASE, AMYLASE,  in the last 168 hours No results found for this basename: AMMONIA,  in the last 168 hours CBC:  Recent Labs Lab 07/12/12 1111 07/13/12 0550 07/13/12 2353 07/15/12 0615  WBC 0.8* 2.7* 7.5 11.4*  NEUTROABS  --  1.3* 5.5 8.8*  HGB 11.7* 10.7* 11.7* 11.4*  HCT 35.7* 32.0* 35.1* 34.9*  MCV 76.4* 76.2* 75.6* 76.7*  PLT 108* 90* 112* 131*   Cardiac Enzymes:  Recent Labs Lab 07/13/12 2353 07/14/12 0519 07/14/12 1225  TROPONINI <0.30 <0.30 <0.30   BNP (last 3 results) No results found for this basename: PROBNP,  in the last 8760 hours CBG:  Recent Labs Lab 07/14/12 0802 07/14/12 1203 07/14/12 1658 07/14/12 2141 07/15/12 0744  GLUCAP 133* 113* 170* 115* 118*    Recent Results (from the past 240 hour(s))  CULTURE, BLOOD (ROUTINE X 2)     Status: None   Collection Time    07/12/12 11:45 AM      Result Value Range Status   Specimen Description BLOOD LEFT FOREARM   Final   Special Requests BOTTLES DRAWN AEROBIC AND ANAEROBIC   Final   Culture  Setup Time 07/12/2012 15:29   Final   Culture     Final   Value:        BLOOD CULTURE RECEIVED NO GROWTH TO DATE CULTURE WILL BE HELD FOR 5 DAYS BEFORE ISSUING A FINAL NEGATIVE REPORT   Report Status PENDING   Incomplete  CULTURE, BLOOD (ROUTINE X 2)     Status: None   Collection Time    07/12/12 11:51 AM      Result Value Range Status   Specimen Description BLOOD EFT HAND   Final   Special Requests BOTTLES DRAWN AEROBIC AND ANAEROBIC   Final   Culture  Setup Time 07/12/2012 15:29   Final   Culture     Final   Value:        BLOOD CULTURE RECEIVED NO GROWTH TO DATE CULTURE WILL BE HELD FOR 5 DAYS BEFORE ISSUING A FINAL NEGATIVE REPORT   Report Status PENDING   Incomplete  CLOSTRIDIUM DIFFICILE BY PCR     Status: None   Collection Time    07/12/12  6:30 PM      Result Value Range Status   C difficile by pcr NEGATIVE  NEGATIVE  Final  STOOL CULTURE     Status: None   Collection Time    07/12/12  6:33 PM      Result Value Range Status   Specimen Description STOOL   Final   Special Requests Immunocompromised   Final   Culture NO SUSPICIOUS COLONIES, CONTINUING TO HOLD   Final   Report Status PENDING  Incomplete     Studies: No results found.  Scheduled Meds: . aspirin EC  81 mg Oral q morning - 10a  . atorvastatin  10 mg Oral QHS  . buPROPion  150 mg Oral q morning - 10a  . diltiazem  30 mg Oral Q6H  . escitalopram  10 mg Oral QHS  . feeding supplement  1 Container Oral BID BM  . insulin aspart  0-9 Units Subcutaneous TID WC  . levothyroxine  125 mcg Oral QAC breakfast  . linagliptin  5 mg Oral Daily  . metoprolol succinate  50 mg Oral BID  . mirtazapine  7.5 mg Oral QHS  . pantoprazole  40 mg Oral Daily   Continuous Infusions: . 0.9 % NaCl with KCl 40 mEq / L 100 mL/hr at 07/15/12 0128  . heparin 1,700 Units/hr (07/15/12 0702)    Principal Problem:   Neutropenic fever Active Problems:   HYPOTHYROIDISM   DIABETES MELLITUS, TYPE II   HYPERLIPIDEMIA   DEPRESSION   OBSTRUCTIVE SLEEP APNEA   HYPERTENSION   GERD   DVT, HX OF   Liver disease, chronic, with cirrhosis   Cancer of breast   Microcytic anemia   Thrombocytopenia   Hypokalemia   Hyponatremia   Obesity, unspecified   Severe malnutrition in the context of acute illness   A-fib    Time spent: 35    [facility], JESSICA  [facility] Pager [phone]. If 7PM-7AM, please contact night-coverage at [email or web address], password [facility] 07/15/2012, 10:32 AM  LOS: 3 days

## 2012-07-15 NOTE — Progress Notes (Signed)
Patient ID: [name], female   DOB: Aug 06, 1955, 57 y.o.   MRN: [id number]    SUBJECTIVE:Patient remains in atrial fibrillation with RVR to the 130s today.  She does not feel the atrial fibrillation and has no complaints.   [name] aspirin EC  81 mg Oral q morning - 10a  . atorvastatin  10 mg Oral QHS  . buPROPion  150 mg Oral q morning - 10a  . diphenoxylate-atropine  1 tablet Oral QID  . escitalopram  10 mg Oral QHS  . feeding supplement  1 Container Oral BID BM  . insulin aspart  0-9 Units Subcutaneous TID WC  . levothyroxine  125 mcg Oral QAC breakfast  . linagliptin  5 mg Oral Daily  . metoprolol tartrate  25 mg Oral TID  . mirtazapine  7.5 mg Oral QHS  . pantoprazole  40 mg Oral Daily  . piperacillin-tazobactam (ZOSYN)  IV  3.375 g Intravenous Q8H  heparin gtt    Filed Vitals:   07/14/12 1409 07/14/12 2139 07/14/12 2320 07/15/12 0500  BP: 99/54 123/64 115/61 119/79  Pulse: 137 101 140 130  Temp: 97.5 F (36.4 C) 98.5 F (36.9 C) 98.7 F (37.1 C) 98.1 F (36.7 C)  TempSrc: Oral Oral Oral Oral  Resp: 20 20 18 18   Height:      Weight:      SpO2: 98% 96% 97% 98%    Intake/Output Summary (Last 24 hours) at 07/15/12 0729 Last data filed at 07/14/12 2030  Gross per 24 hour  Intake 2381.67 ml  Output    901 ml  Net 1480.67 ml    LABS: Basic Metabolic Panel:  Recent Labs  40/98/11 0550 07/13/12 2353 07/14/12 0519 07/14/12 1100  NA 130* 136 137  --   K 3.0* 3.3* 3.8  --   CL 97 102 104  --   CO2 25 24 22   --   GLUCOSE 109* 109* 118*  --   BUN 5* 5* 7  --   CREATININE 0.47* 0.83 1.00  --   CALCIUM 7.5* 7.8* 7.9*  --   MG 1.7  --   --  2.0   Liver Function Tests:  Recent Labs  07/12/12 1111 07/13/12 0550  AST 35 20  ALT 57* 36*  ALKPHOS 143* 111  BILITOT 1.2 0.6  PROT 6.4 5.3*  ALBUMIN 3.2* 2.5*   No results found for this basename: LIPASE, AMYLASE,  in the last 72 hours CBC:  Recent Labs  07/13/12 2353 07/15/12 0615  WBC 7.5 11.4*    NEUTROABS 5.5 8.8*  HGB 11.7* 11.4*  HCT 35.1* 34.9*  MCV 75.6* 76.7*  PLT 112* 131*   Cardiac Enzymes:  Recent Labs  07/13/12 2353 07/14/12 0519 07/14/12 1225  TROPONINI <0.30 <0.30 <0.30   BNP: No components found with this basename: POCBNP,  D-Dimer: No results found for this basename: DDIMER,  in the last 72 hours Hemoglobin A1C: No results found for this basename: HGBA1C,  in the last 72 hours Fasting Lipid Panel: No results found for this basename: CHOL, HDL, LDLCALC, TRIG, CHOLHDL, LDLDIRECT,  in the last 72 hours Thyroid Function Tests:  Recent Labs  07/14/12 0519  TSH 3.640   Anemia Panel: No results found for this basename: VITAMINB12, FOLATE, FERRITIN, TIBC, IRON, RETICCTPCT,  in the last 72 hours  RADIOLOGY: Dg Chest 2 View  07/12/2012  *RADIOLOGY REPORT*  Clinical Data: Fever.  Breast cancer  CHEST - 2 VIEW  Comparison: 06/08/2012  Findings: Lungs are clear without evidence of pneumonia.  No heart failure or effusion.  Negative for mass lesion.  Port-A-Cath tip in the SVC.  IMPRESSION: No acute abnormality.   Original Report Authenticated By: [name], M.D.    Nm Pet Image Initial (pi) Skull Base To Thigh  07/05/2012  *RADIOLOGY REPORT*  Clinical Data: Initial treatment strategy for right breast carcinoma.  NUCLEAR MEDICINE PET SKULL BASE TO THIGH  Fasting Blood Glucose:  100  Technique:  18.8 mCi F-18 FDG was injected intravenously. CT data was obtained and used for attenuation correction and anatomic localization only.  (This was not acquired as a diagnostic CT examination.) Additional exam technical data entered on technologist worksheet.  Comparison:  None  Findings:  Neck: No hypermetabolic lymph nodes in the neck.  Chest:  No hypermetabolic mediastinal or hilar nodes.  No hypermetabolic axillary lymph nodes identified.  Muscular activity noted in left shoulder girdle.  No suspicious pulmonary nodules on the CT scan.  Abdomen/Pelvis:  No abnormal  hypermetabolic activity within the liver, pancreas, adrenal glands, or spleen.  No hypermetabolic lymph nodes in the abdomen or pelvis.  Incidental findings noted on noncontrast CT or hepatic cirrhosis and mild splenomegaly, consistent with portal venous hypertension.  No evidence of ascites.  Several small uterine fibroids are also noted, at least one showing metabolic activity.  Skeleton:  No focal hypermetabolic activity to suggest skeletal metastasis.  IMPRESSION:  1.  No metabolically active malignancy identified. 2.  Incidental findings include hepatic cirrhosis, mild splenomegaly, and small uterine fibroids.   Original Report Authenticated By: Myles Rosenthal, M.D.     PHYSICAL EXAM General: NAD Neck: No JVD, no thyromegaly or thyroid nodule.  Lungs: Clear to auscultation bilaterally with normal respiratory effort. CV: Nondisplaced PMI.  Heart tachy, irregular S1/S2, no S3/S4, no murmur.  No peripheral edema.  No carotid bruit.  Normal pedal pulses.  Abdomen: Soft, nontender, no hepatosplenomegaly, no distention.  Neurologic: Alert and oriented x 3.  Psych: Normal affect. Extremities: No clubbing or cyanosis.   TELEMETRY: Reviewed telemetry pt in atrial fibrillation in 130s  ASSESSMENT  1. Neutropenic fever with diarrhea  2. Atrial fibrillation with RVR, newly recognized  3. NSVT  4. Breast CA s/p R mastectomy with lymph node involvement, recently completed first docetaxel/carboplatin treatment  5. Diabetes mellitus  6. HTN, controlled  7. Remote DVT 2006 while on HRT  PLAN Patient remains in rapid atrial fibrillation this morning.  CHADSVASC = 3 (HTN, DM, gender).  All things being equal, would anticoagulate with Eliquis or Xarelto.  However, she has been getting chemotherapy for breast cancer.  Would check with her heme/onc specialist regarding how low her platelets are expected to drop with treatment => if will be very significant, would hold off on anticoagulation. However, looking  back I see only mild thrombocytopenia.  EF normal on echo.  - consolidate beta blocker to Toprol XL 50 mg bid. - add diltiazem 30 mg every 6 hrs - continue heparin gtt for now, would tentatively plan on Eliquis or Xarelto prior to discharge if risk of thrombocytopenia from chemo not felt to be too great.   [name] 07/15/2012 7:33 AM

## 2012-07-16 ENCOUNTER — Inpatient Hospital Stay (HOSPITAL_COMMUNITY): Payer: 59

## 2012-07-16 DIAGNOSIS — N289 Disorder of kidney and ureter, unspecified: Secondary | ICD-10-CM

## 2012-07-16 DIAGNOSIS — D6959 Other secondary thrombocytopenia: Secondary | ICD-10-CM

## 2012-07-16 DIAGNOSIS — N179 Acute kidney failure, unspecified: Secondary | ICD-10-CM

## 2012-07-16 LAB — CBC WITH DIFFERENTIAL/PLATELET
Basophils Absolute: 0.1 10*3/uL (ref 0.0–0.1)
Eosinophils Absolute: 0 10*3/uL (ref 0.0–0.7)
Lymphs Abs: 1.2 10*3/uL (ref 0.7–4.0)
MCH: 25.2 pg — ABNORMAL LOW (ref 26.0–34.0)
MCV: 77.4 fL — ABNORMAL LOW (ref 78.0–100.0)
Monocytes Absolute: 0.8 10*3/uL (ref 0.1–1.0)
Neutrophils Relative %: 76 % (ref 43–77)
Platelets: 135 10*3/uL — ABNORMAL LOW (ref 150–400)
RBC: 4.21 MIL/uL (ref 3.87–5.11)
RDW: 15.7 % — ABNORMAL HIGH (ref 11.5–15.5)
WBC: 8.6 10*3/uL (ref 4.0–10.5)

## 2012-07-16 LAB — URINE MICROSCOPIC-ADD ON

## 2012-07-16 LAB — URINALYSIS, ROUTINE W REFLEX MICROSCOPIC
Glucose, UA: NEGATIVE mg/dL
Ketones, ur: NEGATIVE mg/dL
Leukocytes, UA: NEGATIVE
pH: 5 (ref 5.0–8.0)

## 2012-07-16 LAB — BASIC METABOLIC PANEL
BUN: 7 mg/dL (ref 6–23)
Calcium: 7.4 mg/dL — ABNORMAL LOW (ref 8.4–10.5)
Creatinine, Ser: 1.62 mg/dL — ABNORMAL HIGH (ref 0.50–1.10)
GFR calc Af Amer: 39 mL/min — ABNORMAL LOW (ref >90)
GFR calc non Af Amer: 34 mL/min — ABNORMAL LOW (ref >90)
Glucose, Bld: 113 mg/dL — ABNORMAL HIGH (ref 70–99)

## 2012-07-16 LAB — URINE CULTURE

## 2012-07-16 LAB — GLUCOSE, CAPILLARY
Glucose-Capillary: 104 mg/dL — ABNORMAL HIGH (ref 70–99)
Glucose-Capillary: 130 mg/dL — ABNORMAL HIGH (ref 70–99)
Glucose-Capillary: 135 mg/dL — ABNORMAL HIGH (ref 70–99)

## 2012-07-16 LAB — STOOL CULTURE

## 2012-07-16 MED ORDER — SODIUM CHLORIDE 0.9 % IV SOLN
INTRAVENOUS | Status: DC
  Administered 2012-07-16: 125 mL/h via INTRAVENOUS

## 2012-07-16 MED ORDER — SODIUM CHLORIDE 0.9 % IV SOLN: INTRAVENOUS | Status: AC

## 2012-07-16 MED ORDER — DIPHENOXYLATE-ATROPINE 2.5-0.025 MG PO TABS
1.0000 | ORAL_TABLET | Freq: Four times a day (QID) | ORAL | Status: DC | PRN
  Administered 2012-07-17 – 2012-07-18 (×4): 1 via ORAL
  Filled 2012-07-16 (×4): qty 1

## 2012-07-16 MED ORDER — METOPROLOL SUCCINATE ER 50 MG PO TB24
75.0000 mg | ORAL_TABLET | Freq: Two times a day (BID) | ORAL | Status: DC
  Administered 2012-07-16 – 2012-07-20 (×10): 75 mg via ORAL
  Filled 2012-07-16 (×13): qty 1

## 2012-07-16 MED ORDER — DILTIAZEM HCL 60 MG PO TABS
60.0000 mg | ORAL_TABLET | Freq: Four times a day (QID) | ORAL | Status: DC
  Administered 2012-07-16 – 2012-07-19 (×12): 60 mg via ORAL
  Filled 2012-07-16 (×16): qty 1

## 2012-07-16 NOTE — Consult Note (Signed)
[facility]  [phone]     ONCOLOGY  HOSPITAL CONSULTATION NOTE  AISLEY WHAN                                MR#: [id number]  DOB: [birth year]                       CSN#: [id number]  Referring MD: [facility]   Primary MD: [name]., NP   Reason for Consult: Anticoagulation issues   Patient Identification:  A. 57 y.o. [name], patient of Dr. Oralia Manis secretary at [facility]   status post right breast and right axillary biopsy 05/21/2012 for an initial clinical T2 N1, stage IIB invasive ductal carcinoma, grade 2, strongly estrogen and progesterone receptor positive, with an MIB-1 between 5 and 20%, and no HER-2 amplification.  B.S/P right mastectomy under the care of Dr. 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 Lymphovascular and appearing neural invasion was identified. 2 of 18 lymph nodes were involved. Margins were clear. Pathologic staging pT3, pN1a, stage IIIA. (Note the original clinical staging was IIB.)   C.S/P C1 of adjuvant chemotherapy, total  6 q. three-week doses of docetaxel/carboplatin beginning 07/06/12, with Neulasta on day 2 for granulocyte support, followed by postmastectomy radiation and then antiestrogen therapy.   D. comorbidities include diabetes, hypertension, and chronic liver disease and splenomegaly (incidental finding in PET 2/24)  with cirrhosis and fatty liver. In addition she has a history of  remote DVT 2006 while on HRT, requiring Coumadin.   HPI: Patient presented to [facility] with watery diarrhea on 07/12/2012 - 20-30 stools without melena or hematochezia), fever up to 100.4, and a 2 day history of productive cough. She was found to have neutropenia with a WBC 0.8. She received broad spectrum antibiotics with vancomycin and Zosyn  and placed on Neupogen.  C. difficile PCR negative. Other cultures negative.Diarhrea still present, 3 episodes today, despite Imodium therapy.  Antibiotics d/c'd today.However, as of 3/5,status complicated with tachycardia, rapid A Fib requiring Cardiology involvement.  She had been initiated on 3/5 with IV Heparin.  Pharmacy recommends Eliquis or Xarelto, although the concern for chemo related thrombocytopenia is present. On admission her plts were 108, dropping to 90 and 112 on 3/4, 131 on 3/6 and 135 today.No bleeding episodes  reported. No transfusions this admission .INR is 1.32, PT 16.1 and PTT 34 We have been requested to see the patient regarding long term anticoagulation issues.  PMH:  Past Medical History  Diagnosis Date  . Depression   . DVT (deep venous thrombosis)     hx of on HRT left leg ~2006  . GERD (gastroesophageal reflux disease)   . Hyperlipidemia   . Hypertension   . Hypothyroidism   . PPD positive, treated     rx inh   . OSA on CPAP   . Diabetes mellitus   . 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 Breast cancer     a. Right - invasive ductal carcinoma with 2/18 lymph nodes involved (pT3, pN1a, stage IIIA), s/p R mastectomy 06/08/12, beginning chemotherapy,    Surgeries:  Past Surgical History  Procedure Laterality Date  . Tubal ligation    . Cholecystectomy    .  Foot surgery    . Eye surgery    . Cataract extraction    . Percutaneous liver biopsy    . Breast biopsy      left breast  . Mastectomy modified radical  06/08/2012    Procedure: MASTECTOMY MODIFIED RADICAL;  Surgeon: Mariella Saa, MD;  Location: [facility] OR;  Service: General;  Laterality: Right;  . Portacath placement  06/08/2012    Procedure: INSERTION PORT-A-CATH;  Surgeon: Mariella Saa, MD;  Location: [facility] OR;  Service: General;  Laterality: Left;    Allergies:  Allergies  Allergen Reactions  . Olmesartan Medoxomil     REACTION: ? if cough  . Tetracycline Hcl     REACTION: unspecified   . Venlafaxine     REACTION: severe dry moouth  . Adhesive (Tape) Rash    Medications:   Prior to Admission:  Prescriptions prior to admission  Medication Sig Dispense Refill  . ALPRAZolam (XANAX) 0.25 MG tablet Take 1 tablet (0.25 mg total) by mouth 3 (three) times daily as needed.  30 tablet  0  . amLODipine (NORVASC) 5 MG tablet Take 5 mg by mouth every morning.      [name] aspirin EC 81 MG tablet Take 81 mg by mouth every morning.      [name] atorvastatin (LIPITOR) 10 MG tablet Take 10 mg by mouth at bedtime.   90 tablet  1  . buPROPion (WELLBUTRIN SR) 150 MG 12 hr tablet Take 150 mg by mouth every morning.       [name] dexamethasone (DECADRON) 4 MG tablet Take 4 mg by mouth. 2 tabs PO BID on day before and 2 days after chemo. 2 tabs PO in am only on day 4.      . escitalopram (LEXAPRO) 10 MG tablet Take 10 mg by mouth at bedtime.      [name] glucose blood (TRUETEST TEST) test strip Use as instructed  100 each  12  . levothyroxine (SYNTHROID, LEVOTHROID) 125 MCG tablet Take 125 mcg by mouth daily before breakfast.      . lidocaine-prilocaine (EMLA) cream Apply topically as needed.  30 g  1  . LORazepam (ATIVAN) 0.5 MG tablet 1 tab up to twice daily PRN anxiety, insomnia and/or nausea  30 tablet  0  . omeprazole (PRILOSEC) 20 MG capsule Take 1 capsule (20 mg total) by mouth 2 (two) times daily.  180 capsule  2  . prochlorperazine (COMPAZINE) 10 MG tablet 1 tab PO ACHS x 3 days after chemo, then 1 tab PO q 6 hr PRN nausea  30 tablet  2  . promethazine (PHENERGAN) 12.5 MG tablet Take 1 tablet (12.5 mg total) by mouth every 4 (four) hours as needed for nausea.  30 tablet  0  . saxagliptin HCl (ONGLYZA) 5 MG TABS tablet Take 5 mg by mouth every morning.        XBJ:YNWGNFAOZHYQM, acetaminophen, alum & mag hydroxide-simeth, antiseptic oral rinse, diphenhydrAMINE, guaiFENesin-dextromethorphan, LORazepam, ondansetron (ZOFRAN) IV, ondansetron, oxyCODONE, sodium chloride  ROS: See HPI for significant positives,  Appetite decreased "doesn't taste as before". No nausea or vomiting. No palpitations. No pain.   rest of ROS negative.   Family History:    Family History  Problem Relation Age of Onset  . Diabetes Mother   . Hypertension Mother   . Arthritis Mother   . Heart disease Mother   . Heart failure Mother   . Other Mother     benign breast mass  . Stroke Father   .  Heart disease Father   . Diabetes Paternal Grandmother   . Colon cancer Paternal Grandfather     No family history of bleeding disorders. No family history of strokes, PE or DVT.   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 Her husband [name] works for [employer]. Daughter [name] is a Scientist, forensic and lives in [place]. Daughter [name] and her family (husband and 2 children aged 5 and 1-1/2 years) currently live with the patient. Reta is a member of a [employer]  Physical Exam    Filed Vitals:   07/16/12 0521  BP: 122/68  Pulse: 114  Temp: 98.6 F (37 C)  Resp: 96      General:56 y.o. female. in no acute distress A. and O. x3  well-developed and well-nourished. Tearful, anxious. HEENT: Normocephalic, atraumatic, PERRLA. Oral cavity without thrush or lesions. Neck supple. no thyromegaly, no cervical or supraclavicular adenopathy  Lungs clear bilaterally . No wheezing, rhonchi or rales. No axillary masses. Breasts: not examined.known R mastectomy. L PAC in place. Cardiac irregular rate and rhythm, no murmur , rubs or gallops Abdomen soft nontender,bowel sounds x4. No HSM. No masses palpable.  GU/rectal: deferred. Extremities no clubbing cyanosis. No pitting edema. .  No bruising or petechial rash Musculoskeletal: no spinal tenderness.  Neuro: Non Focal  Labs:  CBC   Recent Labs Lab 07/12/12 1111 07/13/12 0550 07/13/12 2353 07/15/12 0615 07/16/12 0500  WBC 0.8* 2.7* 7.5 11.4* 8.6  HGB 11.7* 10.7* 11.7* 11.4* 10.6*  HCT  35.7* 32.0* 35.1* 34.9* 32.6*  PLT 108* 90* 112* 131* 135*  MCV 76.4* 76.2* 75.6* 76.7* 77.4*  MCH 25.1* 25.5* 25.2* 25.1* 25.2*  MCHC 32.8 33.4 33.3 32.7 32.5  RDW 14.4 14.5 14.5 15.3 15.7*  LYMPHSABS 0.0* 0.8 1.1 1.5 1.2  MONOABS 0.0* 0.5 0.8 1.0 0.8  EOSABS 0.0 0.0 0.0 0.0 0.0  BASOSABS 0.0 0.0 0.1 0.1 0.1     CMP    Recent Labs Lab 07/12/12 1111 07/13/12 0550 07/13/12 2353 07/14/12 0519 07/14/12 1100 07/15/12 0615 07/16/12 0500  NA 129* 130* 136 137  --  137 136  K 3.1* 3.0* 3.3* 3.8  --  3.6 3.9  CL 94* 97 102 104  --  107 108  CO2 23 25 24 22   --  21 19  GLUCOSE 153* 109* 109* 118*  --  113* 113*  BUN 8 5* 5* 7  --  8 7  CREATININE 0.51 0.47* 0.83 1.00  --  1.59* 1.62*  CALCIUM 8.3* 7.5* 7.8* 7.9*  --  7.5* 7.4*  MG  --  1.7  --   --  2.0  --   --   AST 35 20  --   --   --   --   --   ALT 57* 36*  --   --   --   --   --   ALKPHOS 143* 111  --   --   --   --   --   BILITOT 1.2 0.6  --   --   --   --   --         Component Value Date/Time   BILITOT 0.6 07/13/2012 0550   BILITOT 0.46 07/06/2012 0858   BILIDIR 0.2 05/03/2012 1407   IBILI 0.6 05/03/2012 1407      Recent Labs Lab 07/14/12 1100  INR 1.32     Imaging Studies:  Dg Chest 2 View  07/12/2012  *RADIOLOGY REPORT*  Clinical Data: Fever.  Breast cancer  CHEST - 2 VIEW  Comparison: 06/08/2012  Findings: Lungs are clear without evidence of pneumonia.  No heart failure or effusion.  Negative for mass lesion.  Port-A-Cath tip in the SVC.  IMPRESSION: No acute abnormality.   Original Report Authenticated By: [name], M.D.    Nm Pet Image Initial (pi) Skull Base To Thigh  07/05/2012  *RADIOLOGY REPORT*  Clinical Data: Initial treatment strategy for right breast carcinoma.  NUCLEAR MEDICINE PET SKULL BASE TO THIGH  Fasting Blood Glucose:  100  Technique:  18.8 mCi F-18 FDG was injected intravenously. CT data was obtained and used for attenuation correction and anatomic localization only.  (This was not  acquired as a diagnostic CT examination.) Additional exam technical data entered on technologist worksheet.  Comparison:  None  Findings:  Neck: No hypermetabolic lymph nodes in the neck.  Chest:  No hypermetabolic mediastinal or hilar nodes.  No hypermetabolic axillary lymph nodes identified.  Muscular activity noted in left shoulder girdle.  No suspicious pulmonary nodules on the CT scan.  Abdomen/Pelvis:  No abnormal hypermetabolic activity within the liver, pancreas, adrenal glands, or spleen.  No hypermetabolic lymph nodes in the abdomen or pelvis.  Incidental findings noted on noncontrast CT or hepatic cirrhosis and mild splenomegaly, consistent with portal venous hypertension.  No evidence of ascites.  Several small uterine fibroids are also noted, at least one showing metabolic activity.  Skeleton:  No focal hypermetabolic activity to suggest skeletal metastasis.  IMPRESSION:  1.comorbidities include diabetes, hypertension, and chronic liver disease with cirrhosis and fatty liver.    Original Report Authenticated By: Myles Rosenthal, M.D.       A/P: 57 y.o. female   recently diagnosed with R invasive ductal breast carcinoma with lymph node involvement (pT3, pN1a, stage IIIA). She was seen by Dr. Elease Hashimoto 08/2011 for hypertension and sinus tach with normal EF by echo at that time. She is s/p mastectomy 06/08/12 who underwent her first chemo treatment on 07/06/12 with docetaxel/carboplatin followed by Neulasta injection the following day.    Admitted on 3/3 with severe diarrhea and neutropenic fever, (diarrhea not resolving yet) but with complications related to rapid A Fib requiring Heparin per pharmacy. She has thrombocytopenia, in the setting of liver disease and recent chemo. IDr.Jahziel Sinn  is to see the patient following this consult with recommendations regarding diagnosis and  further workup studies.  An addendum to this note is to be written. Thank you for the referral.  Marcos Eke, PA-C 07/16/2012 10:33  AM   =============================  Attending's note:  ATTENDING'S ATTESTATION:  I personally reviewed patient's chart, examined patient myself, formulated the treatment plan as followed.     ACUTE PROBLEM LIST: 1.  Breast cancer. 2.  Recent adjuvant chemo. 3.  Neutropenic fever.  4.  Atrial fibrillation; need for chronic anticoagulation. 5.  Slight renal insufficiency. 6.  Diarrhea:  Most likely chemo .   IMPRESSION:   - I prefer Pradaxa over Coumadin while she is on chemo for the next 4 months.  Chemo will causes recurrent thrombocytopenia and the need to hold Coumadin which takes longer than Pradaxa to start and taper off each time.  Pradaxa has much shorter half life than Coumadin which will be more appropriate.  Her renal insufficiency is mild which is most likely due to her diarrhea.  Her renal function should be monitored while on Pradaxa to ensure no significantly worsened  Cr which may make it unsafe to use Pradaxa over Coumadin.  She understood that there is no good antidote for Pradaxa compared to Coumadin if she has bleeding.  However, she is comfortable with this as she won't need to be monitored like Coumadin.    RECOMMENDATIONS: - Start renally dose Pradaxa if primary team/Cardiology agree. - Monitor renal function. - Continue Imodium or consider Lomotil for diarrhea.  This is actually make her uncomfortable from leaving the hospital today.  - OK to d/c home from oncology standpoint.  - Follow up with Dr. [name] upon discharge.

## 2012-07-16 NOTE — Progress Notes (Signed)
[facility] PROGRESS NOTE  CHARDONNAY HOLZMANN NUU:[id number] DOB: 25-Nov-1955 DOA: 07/12/2012 PCP: [name]., NP  Assessment/Plan: Neutropenic fever  -hold broad-spectrum antibiotics including vancomycin and Zosyn as BX NGTD and all other culture negative (day #5)  -Followup blood and urine cultures- negative so far -Followup stool cultures. C. difficile PCR negative. Diarrhea x 1 today- no abd pain - D/C Neupogen   A fib with RVR - off cardizem gtt -increase metoprolol -heparin : per cards: would anticoagulate with Eliquis or Xarelto. Consulted Dr. Gaylyn Rong since her heme/onc specialist regarding how low her platelets are expected to drop with treatment => if will be very significant, would hold off on anticoagulation (will contact heme onc when back in office) -appreciate cards assistance   Anorexia / Severe malnutrition in the context of acute illness / Obesity  -Patient requests something to stimulate her appetite. Low dose Remeron started.  -Dietician consult.  -Add Ensure.   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   HYPOTHYROIDISM  -Continue home dose of Synthroid.   DIABETES MELLITUS, TYPE II  -Check CBGs q. a.c. and cover with sliding scale insulin, sensitive scale. CBGs controlled -Continue Onglyza.   HYPERLIPIDEMIA  -Continue Lipitor.   DEPRESSION  -Continue Wellbutrin and Lexapro. Ativan as needed for breakthrough anxiety.   OBSTRUCTIVE SLEEP APNEA  -Continue CPAP each bedtime per home settings .  HYPERTENSION  -Continue Norvasc.   GERD  -Continue PPI therapy.   DVT, HX OF  -heparin gtt now per cardiology  Liver disease, chronic, with cirrhosis  -LFTs currently stable.   Cancer of breast  -Status post recent chemotherapy. Dr. [name] is out of town, can follow up with him post discharge.   Microcytic anemia / Thrombocytopenia  -Likely the  sequela of recent chemotherapy. Monitor blood counts closely. Monitor for signs of bleeding. Transfuse as needed for hemoglobin less than 7.      Code Status: full Family Communication: patient at bedside Disposition Plan:    Consultants:  cardiology  Procedures:  none  Antibiotics:    HPI/Subjective: feeling well- good UOP Diarrhea x 1 today   Objective: Filed Vitals:   07/15/12 2040 07/15/12 2305 07/16/12 0318 07/16/12 0521  BP: 132/74 120/66 115/73 122/68  Pulse: 115 120 139 114  Temp: 98.1 F (36.7 C) 99 F (37.2 C) 98.2 F (36.8 C) 98.6 F (37 C)  TempSrc: Oral Oral Oral Oral  Resp: 18 18 18 18   Height:      Weight:      SpO2: 95% 96% 97% 100%    Intake/Output Summary (Last 24 hours) at 07/16/12 0950 Last data filed at 07/16/12 0319  Gross per 24 hour  Intake 2771.67 ml  Output    900 ml  Net 1871.67 ml   Filed Weights   07/12/12 1502 07/13/12 2340  Weight: 86.3 kg (190 lb 4.1 oz) 86.8 kg (191 lb 5.8 oz)    Exam:   General:  A+Ox3, NAD  Cardiovascular: irregular  Respiratory: clear anterior  Abdomen: +BS, soft  NT  Musculoskeletal: moves all 4 ext    Data Reviewed: Basic Metabolic Panel:  Recent Labs Lab 07/12/12 1111 07/13/12 0550 07/13/12 2353 07/14/12 0519 07/14/12 1100 07/15/12 0615 07/16/12 0500  NA 129* 130* 136 137  --  137 136  K 3.1* 3.0* 3.3* 3.8  --  3.6 3.9  CL 94* 97 102 104  --  107 108  CO2  23 25 24 22   --  21 19  GLUCOSE 153* 109* 109* 118*  --  113* 113*  BUN 8 5* 5* 7  --  8 7  CREATININE 0.51 0.47* 0.83 1.00  --  1.59* 1.62*  CALCIUM 8.3* 7.5* 7.8* 7.9*  --  7.5* 7.4*  MG  --  1.7  --   --  2.0  --   --    Liver Function Tests:  Recent Labs Lab 07/12/12 1111 07/13/12 0550  AST 35 20  ALT 57* 36*  ALKPHOS 143* 111  BILITOT 1.2 0.6  PROT 6.4 5.3*  ALBUMIN 3.2* 2.5*   No results found for this basename: LIPASE, AMYLASE,  in the last 168 hours No results found for this basename: AMMONIA,  in  the last 168 hours CBC:  Recent Labs Lab 07/12/12 1111 07/13/12 0550 07/13/12 2353 07/15/12 0615 07/16/12 0500  WBC 0.8* 2.7* 7.5 11.4* 8.6  NEUTROABS  --  1.3* 5.5 8.8* 6.5  HGB 11.7* 10.7* 11.7* 11.4* 10.6*  HCT 35.7* 32.0* 35.1* 34.9* 32.6*  MCV 76.4* 76.2* 75.6* 76.7* 77.4*  PLT 108* 90* 112* 131* 135*   Cardiac Enzymes:  Recent Labs Lab 07/13/12 2353 07/14/12 0519 07/14/12 1225  TROPONINI <0.30 <0.30 <0.30   BNP (last 3 results) No results found for this basename: PROBNP,  in the last 8760 hours CBG:  Recent Labs Lab 07/15/12 0744 07/15/12 1137 07/15/12 1700 07/15/12 2100 07/16/12 0656  GLUCAP 118* 146* 116* 142* 104*    Recent Results (from the past 240 hour(s))  CULTURE, BLOOD (ROUTINE X 2)     Status: None   Collection Time    07/12/12 11:45 AM      Result Value Range Status   Specimen Description BLOOD LEFT FOREARM   Final   Special Requests BOTTLES DRAWN AEROBIC AND ANAEROBIC   Final   Culture  Setup Time 07/12/2012 15:29   Final   Culture     Final   Value:        BLOOD CULTURE RECEIVED NO GROWTH TO DATE CULTURE WILL BE HELD FOR 5 DAYS BEFORE ISSUING A FINAL NEGATIVE REPORT   Report Status PENDING   Incomplete  CULTURE, BLOOD (ROUTINE X 2)     Status: None   Collection Time    07/12/12 11:51 AM      Result Value Range Status   Specimen Description BLOOD EFT HAND   Final   Special Requests BOTTLES DRAWN AEROBIC AND ANAEROBIC   Final   Culture  Setup Time 07/12/2012 15:29   Final   Culture     Final   Value:        BLOOD CULTURE RECEIVED NO GROWTH TO DATE CULTURE WILL BE HELD FOR 5 DAYS BEFORE ISSUING A FINAL NEGATIVE REPORT   Report Status PENDING   Incomplete  CLOSTRIDIUM DIFFICILE BY PCR     Status: None   Collection Time    07/12/12  6:30 PM      Result Value Range Status   C difficile by pcr NEGATIVE  NEGATIVE Final  STOOL CULTURE     Status: None   Collection Time    07/12/12  6:33 PM      Result Value Range Status    Specimen Description STOOL   Final   Special Requests Immunocompromised   Final   Culture NO SUSPICIOUS COLONIES, CONTINUING TO HOLD   Final   Report Status PENDING   Incomplete  URINE CULTURE  Status: None   Collection Time    07/14/12  8:55 PM      Result Value Range Status   Specimen Description URINE, CLEAN CATCH   Final   Special Requests Immunocompromised   Final   Culture  Setup Time 07/15/2012 02:42   Final   Colony Count NO GROWTH   Final   Culture NO GROWTH   Final   Report Status 07/16/2012 FINAL   Final     Studies: No results found.  Scheduled Meds: . aspirin EC  81 mg Oral q morning - 10a  . atorvastatin  10 mg Oral QHS  . buPROPion  150 mg Oral q morning - 10a  . diltiazem  60 mg Oral Q6H  . escitalopram  10 mg Oral QHS  . feeding supplement  1 Container Oral BID BM  . insulin aspart  0-9 Units Subcutaneous TID WC  . levothyroxine  125 mcg Oral QAC breakfast  . linagliptin  5 mg Oral Daily  . metoprolol succinate  75 mg Oral BID  . mirtazapine  7.5 mg Oral QHS  . pantoprazole  40 mg Oral Daily   Continuous Infusions: . sodium chloride    . heparin 1,700 Units/hr (07/16/12 0802)    Principal Problem:   Neutropenic fever Active Problems:   HYPOTHYROIDISM   DIABETES MELLITUS, TYPE II   HYPERLIPIDEMIA   DEPRESSION   OBSTRUCTIVE SLEEP APNEA   HYPERTENSION   GERD   DVT, HX OF   Liver disease, chronic, with cirrhosis   Cancer of breast   Microcytic anemia   Thrombocytopenia   Hypokalemia   Hyponatremia   Obesity, unspecified   Severe malnutrition in the context of acute illness   A-fib    Time spent: 25    [facility], Tomisha Reppucci  [facility] Pager [phone]. If 7PM-7AM, please contact night-coverage at [email or web address], password [facility] 07/16/2012, 9:50 AM  LOS: 4 days

## 2012-07-16 NOTE — Progress Notes (Signed)
Patient ID: [name], female   DOB: [birth year], 57 y.o.   MRN: [id number]     SUBJECTIVE:Patient remains in atrial fibrillation with RVR to the 120s today.  She does not feel the atrial fibrillation and has no complaints.   [name] aspirin EC  81 mg Oral q morning - 10a  . atorvastatin  10 mg Oral QHS  . buPROPion  150 mg Oral q morning - 10a  . diltiazem  60 mg Oral Q6H  . escitalopram  10 mg Oral QHS  . feeding supplement  1 Container Oral BID BM  . insulin aspart  0-9 Units Subcutaneous TID WC  . levothyroxine  125 mcg Oral QAC breakfast  . linagliptin  5 mg Oral Daily  . metoprolol succinate  75 mg Oral BID  . mirtazapine  7.5 mg Oral QHS  . pantoprazole  40 mg Oral Daily  heparin gtt    Filed Vitals:   07/15/12 2040 07/15/12 2305 07/16/12 0318 07/16/12 0521  BP: 132/74 120/66 115/73 122/68  Pulse: 115 120 139 114  Temp: 98.1 F (36.7 C) 99 F (37.2 C) 98.2 F (36.8 C) 98.6 F (37 C)  TempSrc: Oral Oral Oral Oral  Resp: 18 18 18 18   Height:      Weight:      SpO2: 95% 96% 97% 100%    Intake/Output Summary (Last 24 hours) at 07/16/12 0928 Last data filed at 07/16/12 0319  Gross per 24 hour  Intake 2771.67 ml  Output    900 ml  Net 1871.67 ml    LABS: Basic Metabolic Panel:  Recent Labs  56/21/30 1100 07/15/12 0615 07/16/12 0500  NA  --  137 136  K  --  3.6 3.9  CL  --  107 108  CO2  --  21 19  GLUCOSE  --  113* 113*  BUN  --  8 7  CREATININE  --  1.59* 1.62*  CALCIUM  --  7.5* 7.4*  MG 2.0  --   --    Liver Function Tests: No results found for this basename: AST, ALT, ALKPHOS, BILITOT, PROT, ALBUMIN,  in the last 72 hours No results found for this basename: LIPASE, AMYLASE,  in the last 72 hours CBC:  Recent Labs  07/15/12 0615 07/16/12 0500  WBC 11.4* 8.6  NEUTROABS 8.8* 6.5  HGB 11.4* 10.6*  HCT 34.9* 32.6*  MCV 76.7* 77.4*  PLT 131* 135*   Cardiac Enzymes:  Recent Labs  07/13/12 2353 07/14/12 0519 07/14/12 1225  TROPONINI  <0.30 <0.30 <0.30   BNP: No components found with this basename: POCBNP,  D-Dimer: No results found for this basename: DDIMER,  in the last 72 hours Hemoglobin A1C: No results found for this basename: HGBA1C,  in the last 72 hours Fasting Lipid Panel: No results found for this basename: CHOL, HDL, LDLCALC, TRIG, CHOLHDL, LDLDIRECT,  in the last 72 hours Thyroid Function Tests:  Recent Labs  07/14/12 0519  TSH 3.640   Anemia Panel: No results found for this basename: VITAMINB12, FOLATE, FERRITIN, TIBC, IRON, RETICCTPCT,  in the last 72 hours  RADIOLOGY: Dg Chest 2 View  07/12/2012  *RADIOLOGY REPORT*  Clinical Data: Fever.  Breast cancer  CHEST - 2 VIEW  Comparison: 06/08/2012  Findings: Lungs are clear without evidence of pneumonia.  No heart failure or effusion.  Negative for mass lesion.  Port-A-Cath tip in the SVC.  IMPRESSION: No acute abnormality.   Original Report Authenticated By: [name],  M.D.    PHYSICAL EXAM General: NAD Neck: JVP 10-12 cm, no thyromegaly or thyroid nodule.  Lungs: Clear to auscultation bilaterally with normal respiratory effort. CV: Nondisplaced PMI.  Heart tachy, irregular S1/S2, no S3/S4, no murmur.  No peripheral edema.  No carotid bruit.  Normal pedal pulses.  Abdomen: Soft, nontender, no hepatosplenomegaly, no distention.  Neurologic: Alert and oriented x 3.  Psych: Normal affect. Extremities: No clubbing or cyanosis.   TELEMETRY: Reviewed telemetry pt in atrial fibrillation in 130s  ASSESSMENT  1. Neutropenic fever with diarrhea  2. Atrial fibrillation with RVR, newly recognized  3. NSVT  4. Breast CA s/p R mastectomy with lymph node involvement, recently completed first docetaxel/carboplatin treatment  5. Diabetes mellitus  6. HTN, controlled  7. Remote DVT 2006 while on HRT 8. AKI  PLAN 1. Atrial fibrillation: Patient remains in rapid atrial fibrillation this morning.  CHADSVASC = 3 (HTN, DM, gender).  All things being equal, would  anticoagulate.  As GFR is currently low, would use Xarelto 15 mg daily.  However, she has been getting chemotherapy for breast cancer.  Would check with her heme/onc specialist regarding how low her platelets are expected to drop with treatment => if will be very significant, would hold off on anticoagulation. Looking back I see only mild thrombocytopenia.  EF normal on echo.  - Increase Toprol XL to 75 mg bid - Increase diltiazem to 60 mg every 6 hrs - continue heparin gtt for now, would tentatively plan on Xarelto prior to discharge if risk of thrombocytopenia from chemo not felt to be too great (stop heparin when Xarelto begun).  2. AKI: Creatinine has been rising.  Vancomycin stopped.  Patient has had diarrhea and poor po intake so may be at least partially due to dehydration but BUN is not elevated.  Agree with IV fluid challenge per primary service this morning but would be careful as she does have JVD.  Would make sure the IV fluid duration is limited.   [name] 07/16/2012 9:28 AM

## 2012-07-16 NOTE — Progress Notes (Signed)
Patient states that she does not require any help with her CPAP. She has her own tubing and nasal pillows. Sterile water added by husband who is at bedside. RT encouraged patient to call should she need anything.

## 2012-07-16 NOTE — H&P (Signed)
Referring Provider: No ref. provider found Primary Care Physician:  [name]., NP Primary Nephrologist:  none  Reason for Consultation:  Acute non oliguric renal failure  HPI: 57 y.o. female with a PMH of recently diagnosed breast cancer 05/21/12. status post modified radical mastectomy on the right 06/08/12 1st round of chemotherapy on 07/07/12, then developed profuse watery diarrhea. Developed Fever and chills Friday and Saturday and treated with Vancomycin and Zosyn. Trough Vanco levels 27. C Diff and cultures negative. Given neupogen. On 3/5 she developed atrial fibrillation with a rapid heart rate and concurrent hypotension. Since that episode her creatinine increased from 0.8 to 1.6. She complains only of watery diarrhea and a rash over her neck that has appeared over the last week. She has no shortness of breath or edema   Past Medical History  Diagnosis Date  . Depression   . DVT (deep venous thrombosis)     hx of on HRT left leg ~2006  . GERD (gastroesophageal reflux disease)   . Hyperlipidemia   . Hypertension   . Hypothyroidism   . PPD positive, treated     rx inh   . OSA on CPAP   . Diabetes mellitus   . 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 Breast cancer     a. 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 Tubal ligation    . Cholecystectomy    . Foot surgery    . Eye surgery    . Cataract extraction    . Percutaneous liver biopsy    . Breast biopsy      left breast  . Mastectomy modified radical  06/08/2012    Procedure: MASTECTOMY MODIFIED RADICAL;  Surgeon: Mariella Saa, MD;  Location: [facility] OR;  Service: General;  Laterality: Right;  . Portacath placement  06/08/2012    Procedure: INSERTION PORT-A-CATH;  Surgeon: Mariella Saa, MD;  Location: [facility] OR;  Service: General;   Laterality: Left;    Prior to Admission medications   Medication Sig Start Date End Date Taking? Authorizing Provider  ALPRAZolam (XANAX) 0.25 MG tablet Take 1 tablet (0.25 mg total) by mouth 3 (three) times daily as needed. 05/24/12  Yes Sandford Craze, NP  amLODipine (NORVASC) 5 MG tablet Take 5 mg by mouth every morning.   Yes Historical Provider, MD  aspirin EC 81 MG tablet Take 81 mg by mouth every morning.   Yes Historical Provider, MD  atorvastatin (LIPITOR) 10 MG tablet Take 10 mg by mouth at bedtime.  10/14/11  Yes Madelin Headings, MD  buPROPion (WELLBUTRIN SR) 150 MG 12 hr tablet Take 150 mg by mouth every morning.    Yes Historical Provider, MD  dexamethasone (DECADRON) 4 MG tablet Take 4 mg by mouth. 2 tabs PO BID on day before and 2 days after chemo. 2 tabs PO in am only on day 4. 07/01/12  Yes Amy G Berry, PA-C  escitalopram (LEXAPRO) 10 MG tablet Take 10 mg by mouth at bedtime. 06/29/12  Yes Sandford Craze, NP  glucose blood (TRUETEST TEST) test strip Use as instructed 04/12/12  Yes Madelin Headings, MD  levothyroxine (SYNTHROID, LEVOTHROID) 125 MCG tablet Take 125 mcg by mouth daily before breakfast. 03/31/12  Yes Madelin Headings, MD  lidocaine-prilocaine (EMLA) cream Apply topically as needed. 07/01/12  Yes Amy  Allegra Grana, PA-C  LORazepam (ATIVAN) 0.5 MG tablet 1 tab up to twice daily PRN anxiety, insomnia and/or nausea 07/01/12  Yes Amy G Berry, PA-C  omeprazole (PRILOSEC) 20 MG capsule Take 1 capsule (20 mg total) by mouth 2 (two) times daily. 11/18/11  Yes Madelin Headings, MD  prochlorperazine (COMPAZINE) 10 MG tablet 1 tab PO ACHS x 3 days after chemo, then 1 tab PO q 6 hr PRN nausea 07/01/12  Yes Amy G Berry, PA-C  promethazine (PHENERGAN) 12.5 MG tablet Take 1 tablet (12.5 mg total) by mouth every 4 (four) hours as needed for nausea. 06/29/12  Yes Mariella Saa, MD  saxagliptin HCl (ONGLYZA) 5 MG TABS tablet Take 5 mg by mouth every morning. 06/29/12  Yes Sandford Craze, NP     Current Facility-Administered Medications  Medication Dose Route Frequency Provider Last Rate Last Dose  . 0.9 %  sodium chloride infusion   Intravenous Continuous Joseph Art, DO 125 mL/hr at 07/16/12 1017 125 mL/hr at 07/16/12 1017  . acetaminophen (TYLENOL) tablet 650 mg  650 mg Oral Q6H PRN Maryruth Bun Rama, MD   650 mg at 07/12/12 1548   Or  . acetaminophen (TYLENOL) suppository 650 mg  650 mg Rectal Q6H PRN Christina P Rama, MD      . alum & mag hydroxide-simeth (MAALOX/MYLANTA) 200-200-20 MG/5ML suspension 30 mL  30 mL Oral Q6H PRN Christina P Rama, MD      . antiseptic oral rinse (BIOTENE) solution 15 mL  15 mL Mouth Rinse PRN Maryruth Bun Rama, MD   15 mL at 07/13/12 1813  . aspirin EC tablet 81 mg  81 mg Oral q morning - 10a Maryruth Bun Rama, MD   81 mg at 07/16/12 1017  . atorvastatin (LIPITOR) tablet 10 mg  10 mg Oral QHS Maryruth Bun Rama, MD   10 mg at 07/15/12 2308  . buPROPion [facility] SR) 12 hr tablet 150 mg  150 mg Oral q morning - 10a Maryruth Bun Rama, MD   150 mg at 07/16/12 1017  . diltiazem (CARDIZEM) tablet 60 mg  60 mg Oral Q6H Laurey Morale, MD      . diphenhydrAMINE (BENADRYL) capsule 25 mg  25 mg Oral Q6H PRN Joseph Art, DO      . escitalopram (LEXAPRO) tablet 10 mg  10 mg Oral QHS Maryruth Bun Rama, MD   10 mg at 07/15/12 2309  . feeding supplement (RESOURCE BREEZE) liquid 1 Container  1 Container Oral BID BM Lavena Bullion, RD   1 Container at 07/15/12 1343  . guaiFENesin-dextromethorphan (ROBITUSSIN DM) 100-10 MG/5ML syrup 5 mL  5 mL Oral Q4H PRN Christina P Rama, MD      . heparin ADULT infusion 100 units/mL (25000 units/250 mL)  1,700 Units/hr Intravenous Continuous Joseph Art, DO 17 mL/hr at 07/16/12 0802 1,700 Units/hr at 07/16/12 0802  . insulin aspart (novoLOG) injection 0-9 Units  0-9 Units Subcutaneous TID WC Maryruth Bun Rama, MD   1 Units at 07/15/12 1156  . levothyroxine (SYNTHROID, LEVOTHROID) tablet 125 mcg  125 mcg Oral QAC breakfast  Maryruth Bun Rama, MD   125 mcg at 07/16/12 0802  . linagliptin (TRADJENTA) tablet 5 mg  5 mg Oral Daily Maryruth Bun Rama, MD   5 mg at 07/16/12 1017  . LORazepam (ATIVAN) tablet 1 mg  1 mg Oral BID PRN Maryruth Bun Rama, MD      . metoprolol succinate (TOPROL-XL) 24 hr tablet  75 mg  75 mg Oral BID Laurey Morale, MD   75 mg at 07/16/12 1017  . mirtazapine (REMERON) tablet 7.5 mg  7.5 mg Oral QHS Maryruth Bun Rama, MD   7.5 mg at 07/15/12 2308  . ondansetron (ZOFRAN) tablet 4 mg  4 mg Oral Q6H PRN Christina P Rama, MD       Or  . ondansetron (ZOFRAN) injection 4 mg  4 mg Intravenous Q6H PRN Christina P Rama, MD      . oxyCODONE (Oxy IR/ROXICODONE) immediate release tablet 5 mg  5 mg Oral Q4H PRN Christina P Rama, MD      . pantoprazole (PROTONIX) EC tablet 40 mg  40 mg Oral Daily Maryruth Bun Rama, MD   40 mg at 07/16/12 1017  . sodium chloride 0.9 % injection 10-40 mL  10-40 mL Intracatheter PRN Maryruth Bun Rama, MD   20 mL at 07/14/12 1258    Allergies as of 07/12/2012 - Review Complete 07/12/2012  Allergen Reaction Noted  . Olmesartan medoxomil  02/02/2009  . Tetracycline hcl  05/20/2006  . Venlafaxine  09/06/2009  . Adhesive (tape) Rash 06/08/2012    Family History  Problem Relation Age of Onset  . Diabetes Mother   . Hypertension Mother   . Arthritis Mother   . Heart disease Mother   . Heart failure Mother   . Other Mother     benign breast mass  . Stroke Father   . Heart disease Father   . Diabetes Paternal Grandmother   . Colon cancer Paternal Grandfather     History   Social History  . Marital Status: Married    Spouse Name: N/A    Number of Children: 2  . Years of Education: N/A   Occupational History  . SECRETARY [place]   Social History Main Topics  . Smoking status: Former Games developer  . Smokeless tobacco: Never Used  . Alcohol Use: No  . Drug Use: No  . Sexually Active: No   Other Topics Concern  . Not on file   Social History Narrative   Married    Works at [employer] secretary   Daily caffeine use - 2 cups a day plus a couple of sodas a day   Pt doesn't exercise regularly   G2P2   H H of 5 soon to be 4 .     Review of Systems: Gen: feels weak and had developed fever and chills in relation to chemotherapy and neutropenia No retinopathy,visual complaints, or ocular lesions No sore throat no sinusitis or mouth ulcers CV: Denies chest pain, angina, palpitations, syncope, orthopnea, PND, peripheral edema, and claudication. Resp: Denies dyspnea at rest, dyspnea with exercise, cough, sputum, wheezing, coughing up blood, and pleurisy. UJ:[id number] of diarrhea but no abdominal distention or pain GU no urinary complaints MS: Denies joint pain, limitation of movement, and swelling, stiffness, low back pain, extremity pain. Denies muscle weakness, cramps, atrophy.  No use of non steroidal antiinflammatory drugs. Derm: rash, upper chest wall itchy but not purpuric  Psych: Denies depression, anxiety, memory loss, suicidal ideation, hallucinations, paranoia, and confusion. Heme: Denies bruising, bleeding, and enlarged lymph nodes. Neuro: No headache.  No diplopia. No dysarthria.  No dysphasia.  No history of CVA.  No Seizures. No paresthesias.  No weakness. Endocrine No DM.  No Thyroid disease.  No Adrenal disease.  Physical Exam: Vital signs in last 24 hours: Temp:  [98.1 F (36.7 C)-99 F (37.2 C)] 98.6 F (37  C) (03/07 0521) Pulse Rate:  [106-139] 114 (03/07 0521) Resp:  [16-18] 18 (03/07 0521) BP: (113-132)/(60-74) 122/68 mmHg (03/07 0521) SpO2:  [95 %-100 %] 100 % (03/07 0521) Last BM Date: 07/15/12 General:   Alert,  Well-developed, well-nourished, pleasant and cooperative in NAD Head:  Normocephalic and atraumatic. Eyes:  Sclera clear, no icterus.   Conjunctiva pink. Ears:  Normal auditory acuity. Nose:  No deformity, discharge,  or lesions. Mouth:  No deformity or lesions, dentition normal. Neck:  Supple; no masses or  thyromegaly. JVP not elevated Lungs:  Clear throughout to auscultation.   No wheezes, crackles, or rhonchi. No acute distress. Heart:  Irregular rate  And rhythm no murmurs Abdomen:  Soft, nontender and nondistended. No masses, hepatosplenomegaly or hernias noted. Hypoactive bowel sounds Msk:  Symmetrical without gross deformities. Normal posture. Pulses:  No carotid, renal, femoral bruits. DP and PT symmetrical and equal Extremities:  Without clubbing or edema. Neurologic:  Alert and  oriented x4;  grossly normal neurologically. Skin:   Rash anterior chest wall well demarcated blanchable appears may be from tape Cervical Nodes:  No significant cervical adenopathy. Psych:  Alert and cooperative. Normal mood and affect.  Intake/Output from previous day: 03/06 0701 - 03/07 0700 In: 3011.7 [P.O.:480; I.V.:2531.7] Out: 900 [Urine:900] Intake/Output this shift:    Lab Results:  Recent Labs  07/13/12 2353 07/15/12 0615 07/16/12 0500  WBC 7.5 11.4* 8.6  HGB 11.7* 11.4* 10.6*  HCT 35.1* 34.9* 32.6*  PLT 112* 131* 135*   BMET  Recent Labs  07/14/12 0519 07/15/12 0615 07/16/12 0500  NA 137 137 136  K 3.8 3.6 3.9  CL 104 107 108  CO2 22 21 19   GLUCOSE 118* 113* 113*  BUN 7 8 7   CREATININE 1.00 1.59* 1.62*  CALCIUM 7.9* 7.5* 7.4*   LFT No results found for this basename: PROT, ALBUMIN, AST, ALT, ALKPHOS, BILITOT, BILIDIR, IBILI,  in the last 72 hours PT/INR  Recent Labs  07/14/12 1100  LABPROT 16.1*  INR 1.32   Hepatitis Panel No results found for this basename: HEPBSAG, HCVAB, HEPAIGM, HEPBIGM,  in the last 72 hours  Studies/Results: No results found.  Assessment/Plan:  Acute Kidney Injury . Appears to be consistent with hypotension and atrial fibrillation. Possibly volume depletion from diarrhea contributing . Check urinalysis and urine sodium. Will also check a renal ultrasound although I suspect ATN. Allergic interstitial nephritis is also a possibility with  use of antibiotics, although these have been discontinued. The thrombocytopenia and diarrhea and Acute kidney injury would place TTP in differential although I do not clinically feel this is consistent and will allow Heme to evaluate smear to rule out schistocytes.  HTN/VOL  Appears fairly well controlled for now  Anemia controlled   Electrolytes stable  Metabolic acidosis stable   LOS: 4 WEBB,MARTIN W @TODAY @12 :06 PM

## 2012-07-16 NOTE — Progress Notes (Signed)
ANTICOAGULATION CONSULT NOTE - Follow Up Consult  Pharmacy Consult for IV heparin Indication: atrial fibrillation  Allergies  Allergen Reactions  . Olmesartan Medoxomil     REACTION: ? if cough  . Tetracycline Hcl     REACTION: unspecified  . Venlafaxine     REACTION: severe dry moouth  . Adhesive (Tape) Rash    Patient Measurements: Height: 5\' 4"  (162.6 cm) Weight: 191 lb 5.8 oz (86.8 kg) IBW/kg (Calculated) : 54.7 Heparin Dosing Weight: 74 kg  Vital Signs: Temp: 98.6 F (37 C) (03/07 0521) Temp src: Oral (03/07 0521) BP: 122/68 mmHg (03/07 0521) Pulse Rate: 114 (03/07 0521)  Labs:  Recent Labs  07/13/12 2353 07/14/12 0519 07/14/12 1100 07/14/12 1225  07/15/12 0615 07/15/12 1428 07/16/12 0500  HGB 11.7*  --   --   --   --  11.4*  --  10.6*  HCT 35.1*  --   --   --   --  34.9*  --  32.6*  PLT 112*  --   --   --   --  131*  --  135*  APTT  --   --  34  --   --   --   --   --   LABPROT  --   --  16.1*  --   --   --   --   --   INR  --   --  1.32  --   --   --   --   --   HEPARINUNFRC  --   --   --   --   < > 0.13* 0.43 0.40  CREATININE 0.83 1.00  --   --   --  1.59*  --  1.62*  TROPONINI <0.30 <0.30  --  <0.30  --   --   --   --   < > = values in this interval not displayed.  Estimated Creatinine Clearance: 40.3 ml/min (by C-G formula based on Cr of 1.62).   Assessment: 22 yof with h/o remote DVT, recently diagnosed invasive ductal breast carcinoma undergoing chemotherapy here for neutropenic fever. Patient found with new onset afib with RVR 3/4. Cardiology on board, CHADSVASC = 3 (HTN, DM, gender) - IV heparin started 3/5.    IV heparin currently running at 1700 units/hr and heparin levels are therapeutic x 2.  Cardiology and attending provider noted will discuss with hem/onc the risk for thrombocytopenia on current chemotherapy regimen, considering Eliquis or Xarelto if risk is low.   Plts 135K (slowly improving), Hgb down to 10.6.  NO bleeding.   Goal of  Therapy:  Heparin level 0.3-0.7 units/ml Monitor platelets by anticoagulation protocol: Yes   Plan:   Continue IV heparin at 1700 units/hr  Daily heparin level and CBC  F/u plan for long-term anticoagulation.  Geoffry Paradise, PharmD, BCPS Pager: [phone] 8:33 AM Pharmacy #: [phone]

## 2012-07-17 LAB — BASIC METABOLIC PANEL
Calcium: 7.3 mg/dL — ABNORMAL LOW (ref 8.4–10.5)
Creatinine, Ser: 1.7 mg/dL — ABNORMAL HIGH (ref 0.50–1.10)
GFR calc non Af Amer: 32 mL/min — ABNORMAL LOW (ref >90)
Sodium: 136 mEq/L (ref 135–145)

## 2012-07-17 LAB — HEPARIN LEVEL (UNFRACTIONATED): Heparin Unfractionated: 0.57 IU/mL (ref 0.30–0.70)

## 2012-07-17 LAB — GLUCOSE, CAPILLARY
Glucose-Capillary: 109 mg/dL — ABNORMAL HIGH (ref 70–99)
Glucose-Capillary: 110 mg/dL — ABNORMAL HIGH (ref 70–99)
Glucose-Capillary: 122 mg/dL — ABNORMAL HIGH (ref 70–99)

## 2012-07-17 LAB — CBC WITH DIFFERENTIAL/PLATELET
Basophils Absolute: 0.1 10*3/uL (ref 0.0–0.1)
Eosinophils Absolute: 0 10*3/uL (ref 0.0–0.7)
Lymphocytes Relative: 15 % (ref 12–46)
MCHC: 32.3 g/dL (ref 30.0–36.0)
Neutrophils Relative %: 75 % (ref 43–77)
Platelets: 129 10*3/uL — ABNORMAL LOW (ref 150–400)
RDW: 16.1 % — ABNORMAL HIGH (ref 11.5–15.5)

## 2012-07-17 LAB — SODIUM, URINE, RANDOM
Sodium, Ur: 73 mEq/L
Sodium, Ur: 48 mEq/L

## 2012-07-17 MED ORDER — SODIUM CHLORIDE 0.9 % IV SOLN
INTRAVENOUS | Status: DC
  Administered 2012-07-17 – 2012-07-19 (×6): via INTRAVENOUS
  Administered 2012-07-20: 125 mL/h via INTRAVENOUS
  Administered 2012-07-20 (×2): via INTRAVENOUS

## 2012-07-17 MED ORDER — POTASSIUM CHLORIDE CRYS ER 20 MEQ PO TBCR
40.0000 meq | EXTENDED_RELEASE_TABLET | Freq: Once | ORAL | Status: AC
  Administered 2012-07-17: 40 meq via ORAL
  Filled 2012-07-17: qty 2

## 2012-07-17 NOTE — Progress Notes (Signed)
Subjective:  No complaints, no SOB, no CP, no palps. TMAX 100.7  Objective:  Vital Signs in the last 24 hours: Temp:  [98 F (36.7 C)-100.7 F (38.2 C)] 98 F (36.7 C) (03/08 0556) Pulse Rate:  [64-118] 117 (03/08 0556) Resp:  [18] 18 (03/08 0556) BP: (105-115)/(53-75) 113/75 mmHg (03/08 0556) SpO2:  [92 %-97 %] 94 % (03/08 0556)  Intake/Output from previous day: 03/07 0701 - 03/08 0700 In: 548 [P.O.:480; I.V.:68] Out: 300 [Urine:300]   Physical Exam: General: NAD  Neck: JVP 10-12 cm, no thyromegaly or thyroid nodule.  Lungs: Clear to auscultation bilaterally with normal respiratory effort.  CV: Nondisplaced PMI. Heart tachy, irregular S1/S2, no S3/S4, no murmur. No peripheral edema. No carotid bruit. Normal pedal pulses. Port noted. Abdomen: Soft, nontender, no hepatosplenomegaly, no distention.  Neurologic: Alert and oriented x 3.  Psych: Normal affect.  Extremities: No clubbing or cyanosis.      Lab Results:  Recent Labs  07/16/12 0500 07/17/12 0556  WBC 8.6 8.8  HGB 10.6* 10.4*  PLT 135* 129*    Recent Labs  07/16/12 0500 07/17/12 0556  NA 136 136  K 3.9 3.4*  CL 108 107  CO2 19 20  GLUCOSE 113* 120*  BUN 7 6  CREATININE 1.62* 1.70*    Recent Labs  07/14/12 1225  TROPONINI <0.30   Imaging: US Renal  07/16/2012  *RADIOLOGY REPORT*  Clinical Data: Renal failure  RENAL/URINARY TRACT ULTRASOUND COMPLETE  Comparison:  None.  Findings:  Right Kidney:  Measures 14.6 cm.  No mass or hydronephrosis.  Left Kidney:  Measures 14.2 cm.  No mass or hydronephrosis.  Bladder:  Within normal limits.  Additional comments: Pelvic ascites.  IMPRESSION: Negative renal ultrasound.   Original Report Authenticated By: [name], M.D.      Telemetry: AFIB 100-110 Personally viewed.    Cardiac Studies:  EF normal  ASSESSMENT  1. Neutropenic fever with diarrhea  2. Atrial fibrillation with RVR, newly recognized  3. NSVT  4. Breast CA s/p R mastectomy with  lymph node involvement, recently completed first docetaxel/carboplatin treatment  5. Diabetes mellitus  6. HTN, controlled  7. Remote DVT 2006 while on HRT  8. AKI   PLAN   1. Atrial fibrillation: Patient remains in rapid atrial fibrillation this morning. CHADSVASC = 3 (HTN, DM, gender).Anticoagulate. As GFR is currently low, hematology requests Pradaxa 75mg  BID. EF normal on echo. Watch for signs of bleeding.   - Increase Toprol XL to 100 mg bid  - Continue diltiazem  60 mg every 6 hrs  - continue heparin gtt for now, would tentatively plan on Pradaxa (see heme note) prior to discharge (stop heparin when Pradaxa begun).   2. AKI: Creatinine has been rising. Vancomycin stopped. Patient has had diarrhea and poor po intake so may be at least partially due to dehydration but BUN is not elevated. Agree with IV fluid challenge per primary service this morning but would be careful as she does have JVD. Would make sure the IV fluid duration is limited.      [name], [name] 07/17/2012, 7:49 AM

## 2012-07-17 NOTE — Progress Notes (Signed)
Patient ID: [name], female   DOB: February 11, 1956, 57 y.o.   MRN: [id number] S:still with diarrhea and anorexia O:BP 113/75  Pulse 117  Temp(Src) 98 F (36.7 C) (Oral)  Resp 18  Ht 5\' 4"  (1.626 m)  Wt 86.8 kg (191 lb 5.8 oz)  BMI 32.83 kg/m2  SpO2 94%  Intake/Output Summary (Last 24 hours) at 07/17/12 0919 Last data filed at 07/16/12 2335  Gross per 24 hour  Intake    308 ml  Output    300 ml  Net      8 ml   Intake/Output: I/O last 3 completed shifts: In: 548 [P.O.:480; I.V.:68] Out: 1200 [Urine:1200]  Intake/Output this shift:    Weight change:  Gen:WD WN WF in NAD CVS:IRR IRR, no rub Resp:cta WUJ:WJXBJY NWG:NFAOZ pretib edema   Recent Labs Lab 07/12/12 1111 07/13/12 0550 07/13/12 2353 07/14/12 0519 07/15/12 0615 07/16/12 0500 07/17/12 0556  NA 129* 130* 136 137 137 136 136  K 3.1* 3.0* 3.3* 3.8 3.6 3.9 3.4*  CL 94* 97 102 104 107 108 107  CO2 23 25 24 22 21 19 20   GLUCOSE 153* 109* 109* 118* 113* 113* 120*  BUN 8 5* 5* 7 8 7 6   CREATININE 0.51 0.47* 0.83 1.00 1.59* 1.62* 1.70*  ALBUMIN 3.2* 2.5*  --   --   --   --   --   CALCIUM 8.3* 7.5* 7.8* 7.9* 7.5* 7.4* 7.3*  AST 35 20  --   --   --   --   --   ALT 57* 36*  --   --   --   --   --    Liver Function Tests:  Recent Labs Lab 07/12/12 1111 07/13/12 0550  AST 35 20  ALT 57* 36*  ALKPHOS 143* 111  BILITOT 1.2 0.6  PROT 6.4 5.3*  ALBUMIN 3.2* 2.5*   No results found for this basename: LIPASE, AMYLASE,  in the last 168 hours No results found for this basename: AMMONIA,  in the last 168 hours CBC:  Recent Labs Lab 07/13/12 0550 07/13/12 2353 07/15/12 0615 07/16/12 0500 07/17/12 0556  WBC 2.7* 7.5 11.4* 8.6 8.8  NEUTROABS 1.3* 5.5 8.8* 6.5 6.6  HGB 10.7* 11.7* 11.4* 10.6* 10.4*  HCT 32.0* 35.1* 34.9* 32.6* 32.2*  MCV 76.2* 75.6* 76.7* 77.4* 77.8*  PLT 90* 112* 131* 135* 129*   Cardiac Enzymes:  Recent Labs Lab 07/13/12 2353 07/14/12 0519 07/14/12 1225  TROPONINI <0.30 <0.30  <0.30   CBG:  Recent Labs Lab 07/16/12 0656 07/16/12 1146 07/16/12 1701 07/16/12 2231 07/17/12 0746  GLUCAP 104* 113* 130* 135* 103*    Iron Studies: No results found for this basename: IRON, TIBC, TRANSFERRIN, FERRITIN,  in the last 72 hours Studies/Results: US Renal  07/16/2012  *RADIOLOGY REPORT*  Clinical Data: Renal failure  RENAL/URINARY TRACT ULTRASOUND COMPLETE  Comparison:  None.  Findings:  Right Kidney:  Measures 14.6 cm.  No mass or hydronephrosis.  Left Kidney:  Measures 14.2 cm.  No mass or hydronephrosis.  Bladder:  Within normal limits.  Additional comments: Pelvic ascites.  IMPRESSION: Negative renal ultrasound.   Original Report Authenticated By: [name], M.D.    . aspirin EC  81 mg Oral q morning - 10a  . atorvastatin  10 mg Oral QHS  . buPROPion  150 mg Oral q morning - 10a  . diltiazem  60 mg Oral Q6H  . escitalopram  10 mg Oral QHS  . feeding  supplement  1 Container Oral BID BM  . insulin aspart  0-9 Units Subcutaneous TID WC  . levothyroxine  125 mcg Oral QAC breakfast  . linagliptin  5 mg Oral Daily  . metoprolol succinate  75 mg Oral BID  . mirtazapine  7.5 mg Oral QHS  . pantoprazole  40 mg Oral Daily  . potassium chloride  40 mEq Oral Once    BMET    Component Value Date/Time   NA 136 07/17/2012 0556   NA 139 07/06/2012 0858   K 3.4* 07/17/2012 0556   K 3.8 07/06/2012 0858   CL 107 07/17/2012 0556   CL 108* 07/06/2012 0858   CO2 20 07/17/2012 0556   CO2 21* 07/06/2012 0858   GLUCOSE 120* 07/17/2012 0556   GLUCOSE 185* 07/06/2012 0858   GLUCOSE 92 03/06/2010   BUN 6 07/17/2012 0556   BUN 8.5 07/06/2012 0858   CREATININE 1.70* 07/17/2012 0556   CREATININE 0.7 07/06/2012 0858   CREATININE 0.62 05/03/2012 1407   CALCIUM 7.3* 07/17/2012 0556   CALCIUM 8.9 07/06/2012 0858   GFRNONAA 32* 07/17/2012 0556   GFRAA 37* 07/17/2012 0556   CBC    Component Value Date/Time   WBC 8.8 07/17/2012 0556   WBC 5.7 07/06/2012 0859   RBC 4.14 07/17/2012 0556   RBC 4.53  07/06/2012 0859   HGB 10.4* 07/17/2012 0556   HGB 11.3* 07/06/2012 0859   HCT 32.2* 07/17/2012 0556   HCT 35.4 07/06/2012 0859   PLT 129* 07/17/2012 0556   PLT 153 07/06/2012 0859   MCV 77.8* 07/17/2012 0556   MCV 78.1* 07/06/2012 0859   MCH 25.1* 07/17/2012 0556   MCH 24.9* 07/06/2012 0859   MCHC 32.3 07/17/2012 0556   MCHC 31.9 07/06/2012 0859   RDW 16.1* 07/17/2012 0556   RDW 15.0* 07/06/2012 0859   LYMPHSABS 1.3 07/17/2012 0556   LYMPHSABS 0.8* 07/06/2012 0859   MONOABS 0.8 07/17/2012 0556   MONOABS 0.4 07/06/2012 0859   EOSABS 0.0 07/17/2012 0556   EOSABS 0.0 07/06/2012 0859   BASOSABS 0.1 07/17/2012 0556   BASOSABS 0.0 07/06/2012 0859     Assessment/Plan:  1. AKI: due to ischemic ATN from volume depletion (anorexia/diarrhea) and hypotension from A fib with RVR.  UOP has decreased since IVF's have been stopped.  She is not eating or taking much by po.  Will resume IVF's and cont to follow.  Watch vanc levels 2. Hyponatremia- improved with volume recussitation 3. Neutropenic fever- off vanc and zosyn, afebrile, cont to follow 4. Diarrhea- match intake with output, C Diff PCR neg 5. Thrombocytopenia- follow/stable 6. Anemia of malignancy/chemo (s/p 1st carbo/taxol chemo) 7. Hypotension- improved. 8. A fib- still with RVR, plan per cards  COLADONATO,JOSEPH A

## 2012-07-17 NOTE — Progress Notes (Signed)
[facility] PROGRESS NOTE  [name] ZOX:[id number] DOB: 02-27-1956 DOA: 07/12/2012 PCP: [name]., NP  Assessment/Plan: Neutropenic fever  -hold broad-spectrum antibiotics including vancomycin and Zosyn as BX NGTD and all other culture negative (day #5)  -Followup blood and urine cultures- negative so far -Followup stool cultures. C. difficile PCR negative. Diarrhea x 1 today- no abd pain - D/C Neupogen   A fib with RVR - off cardizem gtt -increase metoprolol -heparin : per cards: will change to renal dose xaralto when patient ready for d/c-appreciate cards assistance/heme onc   Anorexia / Severe malnutrition in the context of acute illness / Obesity  -Patient requests something to stimulate her appetite. Low dose Remeron started.  -Dietician consult.  -Add Ensure.   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   HYPOTHYROIDISM  -Continue home dose of Synthroid.   DIABETES MELLITUS, TYPE II  -Check CBGs q. a.c. and cover with sliding scale insulin, sensitive scale. CBGs controlled -Continue Onglyza.   HYPERLIPIDEMIA  -Continue Lipitor.   DEPRESSION  -Continue Wellbutrin and Lexapro. Ativan as needed for breakthrough anxiety.   OBSTRUCTIVE SLEEP APNEA  -Continue CPAP each bedtime per home settings .  HYPERTENSION  -Continue Norvasc.   GERD  -Continue PPI therapy.   DVT, HX OF  -heparin gtt now per cardiology  Liver disease, chronic, with cirrhosis  -LFTs currently stable.   Cancer of breast  -Status post recent chemotherapy. Dr. [name] is out of town, can follow up with him post discharge.   Microcytic anemia / Thrombocytopenia  -Likely the sequela of recent chemotherapy. Monitor blood counts closely. Monitor for signs of bleeding. Transfuse as needed for hemoglobin less than 7.      Code Status: full Family Communication: patient at  bedside Disposition Plan:    Consultants:  cardiology  Procedures:  none  Antibiotics:    HPI/Subjective: still feeling good No SOB, no CP +loose watery stools  Objective: Filed Vitals:   07/16/12 1427 07/16/12 2235 07/17/12 0236 07/17/12 0556  BP: 115/67 105/57 110/53 113/75  Pulse: 118 105 64 117  Temp: 98.8 F (37.1 C) 100.7 F (38.2 C) 99.4 F (37.4 C) 98 F (36.7 C)  TempSrc: Oral Oral Oral Oral  Resp: 18 18 18 18   Height:      Weight:      SpO2: 97% 94% 92% 94%    Intake/Output Summary (Last 24 hours) at 07/17/12 1128 Last data filed at 07/16/12 2335  Gross per 24 hour  Intake    308 ml  Output    300 ml  Net      8 ml   Filed Weights   07/12/12 1502 07/13/12 2340  Weight: 86.3 kg (190 lb 4.1 oz) 86.8 kg (191 lb 5.8 oz)    Exam:   General:  A+Ox3, NAD  Cardiovascular: irregular  Respiratory: clear anterior  Abdomen: +BS, soft  NT  Musculoskeletal: moves all 4 ext    Data Reviewed: Basic Metabolic Panel:  Recent Labs Lab 07/12/12 1111 07/13/12 0550 07/13/12 2353 07/14/12 0519 07/14/12 1100 07/15/12 0615 07/16/12 0500 07/17/12 0556  NA 129* 130* 136 137  --  137 136 136  K 3.1* 3.0* 3.3* 3.8  --  3.6 3.9 3.4*  CL 94* 97 102 104  --  107 108 107  CO2 23 25 24 22   --  21 19 20   GLUCOSE 153* 109* 109* 118*  --  113* 113* 120*  BUN 8 5* 5* 7  --  8 7 6   CREATININE 0.51 0.47* 0.83 1.00  --  1.59* 1.62* 1.70*  CALCIUM 8.3* 7.5* 7.8* 7.9*  --  7.5* 7.4* 7.3*  MG  --  1.7  --   --  2.0  --   --   --    Liver Function Tests:  Recent Labs Lab 07/12/12 1111 07/13/12 0550  AST 35 20  ALT 57* 36*  ALKPHOS 143* 111  BILITOT 1.2 0.6  PROT 6.4 5.3*  ALBUMIN 3.2* 2.5*   No results found for this basename: LIPASE, AMYLASE,  in the last 168 hours No results found for this basename: AMMONIA,  in the last 168 hours CBC:  Recent Labs Lab 07/13/12 0550 07/13/12 2353 07/15/12 0615 07/16/12 0500 07/17/12 0556  WBC 2.7* 7.5  11.4* 8.6 8.8  NEUTROABS 1.3* 5.5 8.8* 6.5 6.6  HGB 10.7* 11.7* 11.4* 10.6* 10.4*  HCT 32.0* 35.1* 34.9* 32.6* 32.2*  MCV 76.2* 75.6* 76.7* 77.4* 77.8*  PLT 90* 112* 131* 135* 129*   Cardiac Enzymes:  Recent Labs Lab 07/13/12 2353 07/14/12 0519 07/14/12 1225  TROPONINI <0.30 <0.30 <0.30   BNP (last 3 results) No results found for this basename: PROBNP,  in the last 8760 hours CBG:  Recent Labs Lab 07/16/12 0656 07/16/12 1146 07/16/12 1701 07/16/12 2231 07/17/12 0746  GLUCAP 104* 113* 130* 135* 103*    Recent Results (from the past 240 hour(s))  CULTURE, BLOOD (ROUTINE X 2)     Status: None   Collection Time    07/12/12 11:45 AM      Result Value Range Status   Specimen Description BLOOD LEFT FOREARM   Final   Special Requests BOTTLES DRAWN AEROBIC AND ANAEROBIC   Final   Culture  Setup Time 07/12/2012 15:29   Final   Culture     Final   Value:        BLOOD CULTURE RECEIVED NO GROWTH TO DATE CULTURE WILL BE HELD FOR 5 DAYS BEFORE ISSUING A FINAL NEGATIVE REPORT   Report Status PENDING   Incomplete  CULTURE, BLOOD (ROUTINE X 2)     Status: None   Collection Time    07/12/12 11:51 AM      Result Value Range Status   Specimen Description BLOOD EFT HAND   Final   Special Requests BOTTLES DRAWN AEROBIC AND ANAEROBIC   Final   Culture  Setup Time 07/12/2012 15:29   Final   Culture     Final   Value:        BLOOD CULTURE RECEIVED NO GROWTH TO DATE CULTURE WILL BE HELD FOR 5 DAYS BEFORE ISSUING A FINAL NEGATIVE REPORT   Report Status PENDING   Incomplete  CLOSTRIDIUM DIFFICILE BY PCR     Status: None   Collection Time    07/12/12  6:30 PM      Result Value Range Status   C difficile by pcr NEGATIVE  NEGATIVE Final  STOOL CULTURE     Status: None   Collection Time    07/12/12  6:33 PM      Result Value Range Status   Specimen Description STOOL   Final   Special Requests Immunocompromised   Final   Culture     Final   Value: NO SALMONELLA, SHIGELLA,  CAMPYLOBACTER, YERSINIA, OR E.COLI 0157:H7 ISOLATED   Report Status 07/16/2012 FINAL   Final  URINE CULTURE  Status: None   Collection Time    07/14/12  8:55 PM      Result Value Range Status   Specimen Description URINE, CLEAN CATCH   Final   Special Requests Immunocompromised   Final   Culture  Setup Time 07/15/2012 02:42   Final   Colony Count NO GROWTH   Final   Culture NO GROWTH   Final   Report Status 07/16/2012 FINAL   Final     Studies: US Renal  07/16/2012  *RADIOLOGY REPORT*  Clinical Data: Renal failure  RENAL/URINARY TRACT ULTRASOUND COMPLETE  Comparison:  None.  Findings:  Right Kidney:  Measures 14.6 cm.  No mass or hydronephrosis.  Left Kidney:  Measures 14.2 cm.  No mass or hydronephrosis.  Bladder:  Within normal limits.  Additional comments: Pelvic ascites.  IMPRESSION: Negative renal ultrasound.   Original Report Authenticated By: [name], M.D.     Scheduled Meds: . aspirin EC  81 mg Oral q morning - 10a  . atorvastatin  10 mg Oral QHS  . buPROPion  150 mg Oral q morning - 10a  . diltiazem  60 mg Oral Q6H  . escitalopram  10 mg Oral QHS  . feeding supplement  1 Container Oral BID BM  . insulin aspart  0-9 Units Subcutaneous TID WC  . levothyroxine  125 mcg Oral QAC breakfast  . linagliptin  5 mg Oral Daily  . metoprolol succinate  75 mg Oral BID  . mirtazapine  7.5 mg Oral QHS  . pantoprazole  40 mg Oral Daily   Continuous Infusions: . sodium chloride 100 mL/hr at 07/17/12 1115  . heparin 1,700 Units/hr (07/16/12 2324)    Principal Problem:   Neutropenic fever Active Problems:   HYPOTHYROIDISM   DIABETES MELLITUS, TYPE II   HYPERLIPIDEMIA   DEPRESSION   OBSTRUCTIVE SLEEP APNEA   HYPERTENSION   GERD   DVT, HX OF   Liver disease, chronic, with cirrhosis   Cancer of breast   Microcytic anemia   Thrombocytopenia   Hypokalemia   Hyponatremia   Obesity, unspecified   Severe malnutrition in the context of acute illness    A-fib    Time spent: 25    [facility], JESSICA  [facility] Pager [phone]. If 7PM-7AM, please contact night-coverage at [email or web address], password [facility] 07/17/2012, 11:28 AM  LOS: 5 days

## 2012-07-17 NOTE — Progress Notes (Signed)
Spoke with pt regarding cpap tonight. She says she's been wearing for years & doesn't need any assistance with it. CPAP is setup with O2 bleed in & water in humidification chamber. Instructed her to call if she had any problems with it.  Jacqulynn Cadet RRT

## 2012-07-17 NOTE — Progress Notes (Signed)
ANTICOAGULATION CONSULT NOTE - Follow Up Consult  Pharmacy Consult for IV heparin Indication: atrial fibrillation  Allergies  Allergen Reactions  . Olmesartan Medoxomil     REACTION: ? if cough  . Tetracycline Hcl     REACTION: unspecified  . Venlafaxine     REACTION: severe dry moouth  . Adhesive (Tape) Rash    Patient Measurements: Height: 5\' 4"  (162.6 cm) Weight: 191 lb 5.8 oz (86.8 kg) IBW/kg (Calculated) : 54.7 Heparin Dosing Weight: 74 kg  Vital Signs: Temp: 98 F (36.7 C) (03/08 0556) Temp src: Oral (03/08 0556) BP: 113/75 mmHg (03/08 0556) Pulse Rate: 117 (03/08 0556)  Labs:  Recent Labs  07/14/12 1100 07/14/12 1225  07/15/12 0615 07/15/12 1428 07/16/12 0500 07/17/12 0556  HGB  --   --   < > 11.4*  --  10.6* 10.4*  HCT  --   --   --  34.9*  --  32.6* 32.2*  PLT  --   --   --  131*  --  135* 129*  APTT 34  --   --   --   --   --   --   LABPROT 16.1*  --   --   --   --   --   --   INR 1.32  --   --   --   --   --   --   HEPARINUNFRC  --   --   < > 0.13* 0.43 0.40 0.57  CREATININE  --   --   --  1.59*  --  1.62* 1.70*  TROPONINI  --  <0.30  --   --   --   --   --   < > = values in this interval not displayed.  Estimated Creatinine Clearance: 38.4 ml/min (by C-G formula based on Cr of 1.7).   Assessment: 83 yof with h/o remote DVT, recently diagnosed invasive ductal breast carcinoma undergoing chemotherapy here for neutropenic fever. Patient found with new onset afib with RVR 3/4. Cardiology on board, CHADSVASC = 3 (HTN, DM, gender) - IV heparin started 3/5.    IV heparin currently running at 1700 units/hr and without complications or interruptions per RN.  Cardiology and attending provider noted will discuss with hem/onc the risk for thrombocytopenia on current chemotherapy regimen, considering Eliquis or Xarelto if risk is low.                  Heparin level (0.57) is therapeutic  Plts 129K, Hgb down to 10.4.  No bleeding reported.  Goal of  Therapy:  Heparin level 0.3-0.7 units/ml Monitor platelets by anticoagulation protocol: Yes   Plan:   Continue IV heparin at 1700 units/hr  Daily heparin level and CBC  F/u plan for long-term anticoagulation.   Lynann Beaver PharmD, BCPS Pager [phone] 07/17/2012 7:57 AM

## 2012-07-17 NOTE — Progress Notes (Signed)
Pt had 3 beats of VTACH. Asymptomatic. BP stable

## 2012-07-18 LAB — GLUCOSE, CAPILLARY
Glucose-Capillary: 99 mg/dL (ref 70–99)
Glucose-Capillary: 110 mg/dL — ABNORMAL HIGH (ref 70–99)
Glucose-Capillary: 113 mg/dL — ABNORMAL HIGH (ref 70–99)

## 2012-07-18 LAB — HEPARIN LEVEL (UNFRACTIONATED): Heparin Unfractionated: 0.47 IU/mL (ref 0.30–0.70)

## 2012-07-18 LAB — RENAL FUNCTION PANEL
Albumin: 2.3 g/dL — ABNORMAL LOW (ref 3.5–5.2)
CO2: 19 mEq/L (ref 19–32)
Chloride: 108 mEq/L (ref 96–112)
Creatinine, Ser: 1.9 mg/dL — ABNORMAL HIGH (ref 0.50–1.10)
GFR calc Af Amer: 32 mL/min — ABNORMAL LOW (ref >90)
GFR calc non Af Amer: 28 mL/min — ABNORMAL LOW (ref >90)
Potassium: 3.8 mEq/L (ref 3.5–5.1)
Sodium: 137 mEq/L (ref 135–145)

## 2012-07-18 LAB — CBC WITH DIFFERENTIAL/PLATELET
Basophils Absolute: 0 10*3/uL (ref 0.0–0.1)
Eosinophils Relative: 0 % (ref 0–5)
HCT: 30.9 % — ABNORMAL LOW (ref 36.0–46.0)
Lymphocytes Relative: 13 % (ref 12–46)
Lymphs Abs: 1.3 10*3/uL (ref 0.7–4.0)
MCV: 78.8 fL (ref 78.0–100.0)
Neutro Abs: 7.3 10*3/uL (ref 1.7–7.7)
Platelets: 123 10*3/uL — ABNORMAL LOW (ref 150–400)
RBC: 3.92 MIL/uL (ref 3.87–5.11)
WBC: 9.4 10*3/uL (ref 4.0–10.5)

## 2012-07-18 LAB — CULTURE, BLOOD (ROUTINE X 2)

## 2012-07-18 MED ORDER — MAGIC MOUTHWASH
5.0000 mL | Freq: Four times a day (QID) | ORAL | Status: DC
  Administered 2012-07-18: 5 mL via ORAL
  Filled 2012-07-18 (×15): qty 5

## 2012-07-18 MED ORDER — DIPHENOXYLATE-ATROPINE 2.5-0.025 MG PO TABS
1.0000 | ORAL_TABLET | Freq: Four times a day (QID) | ORAL | Status: DC
  Administered 2012-07-19 – 2012-07-20 (×5): 1 via ORAL
  Filled 2012-07-18 (×7): qty 1

## 2012-07-18 NOTE — Progress Notes (Signed)
ANTICOAGULATION CONSULT NOTE - Follow Up Consult  Pharmacy Consult for Heparin Indication: atrial fibrillation  Allergies  Allergen Reactions  . Olmesartan Medoxomil     REACTION: ? if cough  . Tetracycline Hcl     REACTION: unspecified  . Venlafaxine     REACTION: severe dry moouth  . Adhesive (Tape) Rash    Patient Measurements: Height: 5\' 4"  (162.6 cm) Weight: 191 lb 5.8 oz (86.8 kg) IBW/kg (Calculated) : 54.7 Heparin Dosing Weight:   Vital Signs: Temp: 99 F (37.2 C) (03/09 0609) Temp src: Oral (03/09 0609) BP: 114/64 mmHg (03/09 0609) Pulse Rate: 73 (03/09 0609)  Labs:  Recent Labs  07/16/12 0500 07/17/12 0556 07/18/12 0443  HGB 10.6* 10.4* 9.8*  HCT 32.6* 32.2* 30.9*  PLT 135* 129* 123*  HEPARINUNFRC 0.40 0.57 0.47  CREATININE 1.62* 1.70* 1.90*    Estimated Creatinine Clearance: 34.4 ml/min (by C-G formula based on Cr of 1.9).   Medications:  Infusions:  . sodium chloride 100 mL/hr at 07/18/12 0640  . heparin 1,700 Units/hr (07/18/12 0636)    Assessment: Patient with heparin level at goal again in AM.  No issues noted.  Goal of Therapy:  Heparin level 0.3-0.7 units/ml Monitor platelets by anticoagulation protocol: Yes   Plan:  Continue heparin at current rate, recheck levels daily starting 3/10  Darlina Guys, Jacquenette Shone Crowford 07/18/2012,6:56 AM

## 2012-07-18 NOTE — Progress Notes (Signed)
Patient ID: [name], female   DOB: Mar 19, 1956, 57 y.o.   MRN: [id number] S:pt feels better today and states that she has been urinating but "they haven't been collecting it" O:BP 114/64  Pulse 73  Temp(Src) 99 F (37.2 C) (Oral)  Resp 20  Ht 5\' 4"  (1.626 m)  Wt 86.8 kg (191 lb 5.8 oz)  BMI 32.83 kg/m2  SpO2 96%  Intake/Output Summary (Last 24 hours) at 07/18/12 0937 Last data filed at 07/18/12 4098  Gross per 24 hour  Intake 2436.93 ml  Output      0 ml  Net 2436.93 ml   Intake/Output: I/O last 3 completed shifts: In: 2504.9 [I.V.:2504.9] Out: 300 [Urine:300]  Intake/Output this shift:    Weight change:  Gen:WD WN WF in NAD CVS:IRR IRR no rub Resp:CTA Abd:+bs, soft, NT/ND JXB:JYNWG pretib edema   Recent Labs Lab 07/12/12 1111 07/13/12 0550 07/13/12 2353 07/14/12 0519 07/15/12 0615 07/16/12 0500 07/17/12 0556 07/18/12 0443  NA 129* 130* 136 137 137 136 136 137  K 3.1* 3.0* 3.3* 3.8 3.6 3.9 3.4* 3.8  CL 94* 97 102 104 107 108 107 108  CO2 23 25 24 22 21 19 20 19   GLUCOSE 153* 109* 109* 118* 113* 113* 120* 114*  BUN 8 5* 5* 7 8 7 6 7   CREATININE 0.51 0.47* 0.83 1.00 1.59* 1.62* 1.70* 1.90*  ALBUMIN 3.2* 2.5*  --   --   --   --   --  2.3*  CALCIUM 8.3* 7.5* 7.8* 7.9* 7.5* 7.4* 7.3* 7.4*  PHOS  --   --   --   --   --   --   --  3.1  AST 35 20  --   --   --   --   --   --   ALT 57* 36*  --   --   --   --   --   --    Liver Function Tests:  Recent Labs Lab 07/12/12 1111 07/13/12 0550 07/18/12 0443  AST 35 20  --   ALT 57* 36*  --   ALKPHOS 143* 111  --   BILITOT 1.2 0.6  --   PROT 6.4 5.3*  --   ALBUMIN 3.2* 2.5* 2.3*   No results found for this basename: LIPASE, AMYLASE,  in the last 168 hours No results found for this basename: AMMONIA,  in the last 168 hours CBC:  Recent Labs Lab 07/13/12 2353 07/15/12 0615 07/16/12 0500 07/17/12 0556 07/18/12 0443  WBC 7.5 11.4* 8.6 8.8 9.4  NEUTROABS 5.5 8.8* 6.5 6.6 7.3  HGB 11.7* 11.4* 10.6*  10.4* 9.8*  HCT 35.1* 34.9* 32.6* 32.2* 30.9*  MCV 75.6* 76.7* 77.4* 77.8* 78.8  PLT 112* 131* 135* 129* 123*   Cardiac Enzymes:  Recent Labs Lab 07/13/12 2353 07/14/12 0519 07/14/12 1225  TROPONINI <0.30 <0.30 <0.30   CBG:  Recent Labs Lab 07/17/12 0746 07/17/12 1153 07/17/12 1658 07/17/12 2143 07/18/12 0736  GLUCAP 103* 109* 110* 122* 99    Iron Studies: No results found for this basename: IRON, TIBC, TRANSFERRIN, FERRITIN,  in the last 72 hours Studies/Results: US Renal  07/16/2012  *RADIOLOGY REPORT*  Clinical Data: Renal failure  RENAL/URINARY TRACT ULTRASOUND COMPLETE  Comparison:  None.  Findings:  Right Kidney:  Measures 14.6 cm.  No mass or hydronephrosis.  Left Kidney:  Measures 14.2 cm.  No mass or hydronephrosis.  Bladder:  Within normal limits.  Additional comments: Pelvic ascites.  IMPRESSION: Negative renal ultrasound.   Original Report Authenticated By: [name], M.D.    . aspirin EC  81 mg Oral q morning - 10a  . atorvastatin  10 mg Oral QHS  . buPROPion  150 mg Oral q morning - 10a  . diltiazem  60 mg Oral Q6H  . escitalopram  10 mg Oral QHS  . feeding supplement  1 Container Oral BID BM  . insulin aspart  0-9 Units Subcutaneous TID WC  . levothyroxine  125 mcg Oral QAC breakfast  . linagliptin  5 mg Oral Daily  . metoprolol succinate  75 mg Oral BID  . mirtazapine  7.5 mg Oral QHS  . pantoprazole  40 mg Oral Daily    BMET    Component Value Date/Time   NA 137 07/18/2012 0443   NA 139 07/06/2012 0858   K 3.8 07/18/2012 0443   K 3.8 07/06/2012 0858   CL 108 07/18/2012 0443   CL 108* 07/06/2012 0858   CO2 19 07/18/2012 0443   CO2 21* 07/06/2012 0858   GLUCOSE 114* 07/18/2012 0443   GLUCOSE 185* 07/06/2012 0858   GLUCOSE 92 03/06/2010   BUN 7 07/18/2012 0443   BUN 8.5 07/06/2012 0858   CREATININE 1.90* 07/18/2012 0443   CREATININE 0.7 07/06/2012 0858   CREATININE 0.62 05/03/2012 1407   CALCIUM 7.4* 07/18/2012 0443   CALCIUM 8.9 07/06/2012 0858    GFRNONAA 28* 07/18/2012 0443   GFRAA 32* 07/18/2012 0443   CBC    Component Value Date/Time   WBC 9.4 07/18/2012 0443   WBC 5.7 07/06/2012 0859   RBC 3.92 07/18/2012 0443   RBC 4.53 07/06/2012 0859   HGB 9.8* 07/18/2012 0443   HGB 11.3* 07/06/2012 0859   HCT 30.9* 07/18/2012 0443   HCT 35.4 07/06/2012 0859   PLT 123* 07/18/2012 0443   PLT 153 07/06/2012 0859   MCV 78.8 07/18/2012 0443   MCV 78.1* 07/06/2012 0859   MCH 25.0* 07/18/2012 0443   MCH 24.9* 07/06/2012 0859   MCHC 31.7 07/18/2012 0443   MCHC 31.9 07/06/2012 0859   RDW 16.6* 07/18/2012 0443   RDW 15.0* 07/06/2012 0859   LYMPHSABS 1.3 07/18/2012 0443   LYMPHSABS 0.8* 07/06/2012 0859   MONOABS 0.9 07/18/2012 0443   MONOABS 0.4 07/06/2012 0859   EOSABS 0.0 07/18/2012 0443   EOSABS 0.0 07/06/2012 0859   BASOSABS 0.0 07/18/2012 0443   BASOSABS 0.0 07/06/2012 0859     Assessment/Plan:  1. AKI: due to ischemic ATN from volume depletion (anorexia/diarrhea) and hypotension from A fib with RVR. Rate of rise in Scr has slowed since resuming IVF's.  She is still having diarrhea but has improved her po intake.  Will need strict I's/O's and asked her to use the hat in the bathroom to measure UOP. Will increase IVF's and cont to follow. Watch vanc levels.  FeNa was 0.86%.  Pt 2.4 positive without increase in UOP but likely due to not recording/collecting the urine.  Will follow. 2. Hyponatremia- improved with volume recussitation 3. Neutropenic fever- off vanc and zosyn, afebrile, cont to follow 4. Diarrhea- match intake with output, C Diff PCR neg 5. Thrombocytopenia- follow/stable 6. Anemia of malignancy/chemo (s/p 1st carbo/taxol chemo) 7. Hypotension- improved. 8. A fib- still with RVR, plan per cards 9. Dispo- per primary svc  COLADONATO,JOSEPH A

## 2012-07-18 NOTE — Progress Notes (Signed)
Pt doesn't require any assistance with her cpap as she has been placing on herself each night.   Jacqulynn Cadet RRT

## 2012-07-18 NOTE — Consult Note (Signed)
Referring Provider: Tommye Standard Primary Care Physician:  [name]., NP Primary Gastroenterologist:  Dr.Brodie  Reason for Consultation:  Diarrhea  HPI: [name] is a 57 y.o. female who unfortunately was diagnosed with breast cancer in January of 2014. 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 She underwent right radical mastectomy on 06/08/2012 and then started her first round of chemotherapy on 07/07/2012. She was treated with carboplatin and Taxol. Within 48 hours of female she started having severe watery diarrhea with up to 25 bowel movements per day. She really denies any abdominal pain or cramping associated with this and no nausea or vomiting. She did develop associated anorexia and significant weakness eventually developed a fever She was admitted to the hospital on 07/12/2012 dehydrated and with acute kidney injury. 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 She is known to Dr. Lina Sar and was treated with Imuran for a couple of years-this was eventually discontinued. She has never had colonoscopy as she had declined in the past. Since admission she has had stool cultures and stool for C. difficile PCR both negative. He had briefly been covered with Zosyn and vancomycin both of which have been discontinued. Overall she is much better since admission and has only had 2 bowel movements today area She says her stool is not as watery but is more pasty and greenish brown in color. She has no complaints of abdominal pain or nausea but has been unable to eat do to severe anorexia She says her mouth has been extremely dry is not particularly sore pain: She has no odynophagia. She has been using Biotene rinse, and is on Lomotil when necessary   Past Medical History  Diagnosis Date  . Depression   .  DVT (deep venous thrombosis)     hx of on HRT left leg ~2006  . GERD (gastroesophageal reflux disease)   . Hyperlipidemia   . Hypertension   . Hypothyroidism   . PPD positive, treated     rx inh   . OSA on CPAP   . Diabetes mellitus   . 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 Breast cancer     a. 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 Tubal ligation    . Cholecystectomy    . Foot surgery    . Eye surgery    . Cataract extraction    . Percutaneous liver biopsy    . Breast biopsy      left breast  . Mastectomy modified radical  06/08/2012    Procedure: MASTECTOMY MODIFIED RADICAL;  Surgeon: Mariella Saa, MD;  Location: [facility] OR;  Service: General;  Laterality: Right;  . Portacath placement  06/08/2012    Procedure: INSERTION PORT-A-CATH;  Surgeon: Mariella Saa, MD;  Location: [facility] OR;  Service: General;  Laterality: Left;    Prior to Admission medications   Medication Sig Start Date End Date Taking? Authorizing Provider  ALPRAZolam (XANAX) 0.25 MG tablet Take 1 tablet (0.25 mg total) by mouth 3 (three) times daily as needed. 05/24/12  Yes Sandford Craze, NP  amLODipine (NORVASC) 5 MG tablet Take 5 mg by mouth every morning.   Yes Historical Provider,  MD  aspirin EC 81 MG tablet Take 81 mg by mouth every morning.   Yes Historical Provider, MD  atorvastatin (LIPITOR) 10 MG tablet Take 10 mg by mouth at bedtime.  10/14/11  Yes Madelin Headings, MD  buPROPion (WELLBUTRIN SR) 150 MG 12 hr tablet Take 150 mg by mouth every morning.    Yes Historical Provider, MD  dexamethasone (DECADRON) 4 MG tablet Take 4 mg by mouth. 2 tabs PO BID on day before and 2 days after chemo. 2 tabs PO in am only on day 4. 07/01/12  Yes Amy G Berry, PA-C  escitalopram (LEXAPRO) 10 MG tablet Take 10 mg by mouth at bedtime. 06/29/12   Yes Sandford Craze, NP  glucose blood (TRUETEST TEST) test strip Use as instructed 04/12/12  Yes Madelin Headings, MD  levothyroxine (SYNTHROID, LEVOTHROID) 125 MCG tablet Take 125 mcg by mouth daily before breakfast. 03/31/12  Yes Madelin Headings, MD  lidocaine-prilocaine (EMLA) cream Apply topically as needed. 07/01/12  Yes Amy Allegra Grana, PA-C  LORazepam (ATIVAN) 0.5 MG tablet 1 tab up to twice daily PRN anxiety, insomnia and/or nausea 07/01/12  Yes Amy G Berry, PA-C  omeprazole (PRILOSEC) 20 MG capsule Take 1 capsule (20 mg total) by mouth 2 (two) times daily. 11/18/11  Yes Madelin Headings, MD  prochlorperazine (COMPAZINE) 10 MG tablet 1 tab PO ACHS x 3 days after chemo, then 1 tab PO q 6 hr PRN nausea 07/01/12  Yes Amy G Berry, PA-C  promethazine (PHENERGAN) 12.5 MG tablet Take 1 tablet (12.5 mg total) by mouth every 4 (four) hours as needed for nausea. 06/29/12  Yes Mariella Saa, MD  saxagliptin HCl (ONGLYZA) 5 MG TABS tablet Take 5 mg by mouth every morning. 06/29/12  Yes Sandford Craze, NP    Current Facility-Administered Medications  Medication Dose Route Frequency Provider Last Rate Last Dose  . 0.9 %  sodium chloride infusion   Intravenous Continuous Irena Cords, MD 125 mL/hr at 07/18/12 [phone]    . acetaminophen (TYLENOL) tablet 650 mg  650 mg Oral Q6H PRN Maryruth Bun Rama, MD   650 mg at 07/12/12 1548   Or  . acetaminophen (TYLENOL) suppository 650 mg  650 mg Rectal Q6H PRN Christina P Rama, MD      . alum & mag hydroxide-simeth (MAALOX/MYLANTA) 200-200-20 MG/5ML suspension 30 mL  30 mL Oral Q6H PRN Christina P Rama, MD      . antiseptic oral rinse (BIOTENE) solution 15 mL  15 mL Mouth Rinse PRN Maryruth Bun Rama, MD   15 mL at 07/13/12 1813  . aspirin EC tablet 81 mg  81 mg Oral q morning - 10a Maryruth Bun Rama, MD   81 mg at 07/18/12 1002  . atorvastatin (LIPITOR) tablet 10 mg  10 mg Oral QHS Maryruth Bun Rama, MD   10 mg at 07/17/12 2142  . buPROPion [facility] SR) 12 hr  tablet 150 mg  150 mg Oral q morning - 10a Maryruth Bun Rama, MD   150 mg at 07/18/12 1003  . diltiazem (CARDIZEM) tablet 60 mg  60 mg Oral Q6H Laurey Morale, MD   60 mg at 07/18/12 [phone]  . diphenhydrAMINE (BENADRYL) capsule 25 mg  25 mg Oral Q6H PRN Joseph Art, DO      . diphenoxylate-atropine (LOMOTIL) 2.5-0.025 MG per tablet 1 tablet  1 tablet Oral QID PRN Joseph Art, DO   1 tablet at 07/18/12 0636  . escitalopram (LEXAPRO)  tablet 10 mg  10 mg Oral QHS Maryruth Bun Rama, MD   10 mg at 07/17/12 2142  . feeding supplement (RESOURCE BREEZE) liquid 1 Container  1 Container Oral BID BM Lavena Bullion, RD   1 Container at 07/15/12 1343  . guaiFENesin-dextromethorphan (ROBITUSSIN DM) 100-10 MG/5ML syrup 5 mL  5 mL Oral Q4H PRN Christina P Rama, MD      . heparin ADULT infusion 100 units/mL (25000 units/250 mL)  1,700 Units/hr Intravenous Continuous Joseph Art, DO 17 mL/hr at 07/18/12 0636 1,700 Units/hr at 07/18/12 0636  . insulin aspart (novoLOG) injection 0-9 Units  0-9 Units Subcutaneous TID WC Maryruth Bun Rama, MD   1 Units at 07/16/12 1750  . levothyroxine (SYNTHROID, LEVOTHROID) tablet 125 mcg  125 mcg Oral QAC breakfast Maryruth Bun Rama, MD   125 mcg at 07/18/12 0835  . linagliptin (TRADJENTA) tablet 5 mg  5 mg Oral Daily Maryruth Bun Rama, MD   5 mg at 07/18/12 1002  . LORazepam (ATIVAN) tablet 1 mg  1 mg Oral BID PRN Maryruth Bun Rama, MD      . metoprolol succinate (TOPROL-XL) 24 hr tablet 75 mg  75 mg Oral BID Laurey Morale, MD   75 mg at 07/18/12 1003  . mirtazapine (REMERON) tablet 7.5 mg  7.5 mg Oral QHS Maryruth Bun Rama, MD   7.5 mg at 07/17/12 2142  . ondansetron (ZOFRAN) tablet 4 mg  4 mg Oral Q6H PRN Christina P Rama, MD       Or  . ondansetron (ZOFRAN) injection 4 mg  4 mg Intravenous Q6H PRN Christina P Rama, MD      . oxyCODONE (Oxy IR/ROXICODONE) immediate release tablet 5 mg  5 mg Oral Q4H PRN Christina P Rama, MD      . pantoprazole (PROTONIX) EC tablet 40 mg  40 mg  Oral Daily Maryruth Bun Rama, MD   40 mg at 07/18/12 1003  . sodium chloride 0.9 % injection 10-40 mL  10-40 mL Intracatheter PRN Maryruth Bun Rama, MD   20 mL at 07/14/12 1258    Allergies as of 07/12/2012 - Review Complete 07/12/2012  Allergen Reaction Noted  . Olmesartan medoxomil  02/02/2009  . Tetracycline hcl  05/20/2006  . Venlafaxine  09/06/2009  . Adhesive (tape) Rash 06/08/2012    Family History  Problem Relation Age of Onset  . Diabetes Mother   . Hypertension Mother   . Arthritis Mother   . Heart disease Mother   . Heart failure Mother   . Other Mother     benign breast mass  . Stroke Father   . Heart disease Father   . Diabetes Paternal Grandmother   . Colon cancer Paternal Grandfather     History   Social History  . Marital Status: Married    Spouse Name: N/A    Number of Children: 2  . Years of Education: N/A   Occupational History  . SECRETARY [employer]   Social History Main Topics  . Smoking status: Former Games developer  . Smokeless tobacco: Never Used  . Alcohol Use: No  . Drug Use: No  . Sexually Active: No   Other Topics Concern  . 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     Review of Systems: Pertinent positive and negative review of systems were noted in the above HPI section.  All other review of systems was otherwise negative.  Physical Exam: Vital signs in last 24 hours: Temp:  [98.4 F (36.9 C)-99 F (37.2 C)] 98.6 F (37 C) (03/09 0959) Pulse Rate:  [73-112] 73 (03/09 0959) Resp:  [18-20] 19 (03/09 0959) BP: (102-122)/(54-78) 114/54 mmHg (03/09 0959) SpO2:  [94 %-100 %] 94 % (03/09 0959) Last BM Date: 07/18/12 General:   Alert,  Well-developed, well-nourished,Wf  pleasant and cooperative in NAD, husband at bedside Head:  Normocephalic and  atraumatic.,mild alopecia Eyes:  Sclera clear, no icterus.   Conjunctiva pink. Ears:  Normal auditory acuity. Nose:  No deformity, discharge,  or lesions. Mouth:  No deformity or lesions.mucosa dry   Neck:  Supple; no masses or thyromegaly. Lungs:  Clear throughout to auscultation.   No wheezes, crackles, or rhonchi. Heart:  Regular rate and rhythm; no murmurs, clicks, rubs,  or gallops. Abdomen:  Soft,nontender, BS active,nonpalp mass or hsm.   Rectal:  Deferred  Msk:  Symmetrical without gross deformities. . Pulses:  Normal pulses noted. Extremities:  Without clubbing or edema. Neurologic:  Alert and  oriented x4;  grossly normal neurologically. Skin:  Intact without significant lesions or rashes.. Psych:  Alert and cooperative. Normal mood and affect.  Intake/Output from previous day: 03/08 0701 - 03/09 0700 In: 2436.9 [I.V.:2436.9] Out: -  Intake/Output this shift:    Lab Results:  Recent Labs  07/16/12 0500 07/17/12 0556 07/18/12 0443  WBC 8.6 8.8 9.4  HGB 10.6* 10.4* 9.8*  HCT 32.6* 32.2* 30.9*  PLT 135* 129* 123*   BMET  Recent Labs  07/16/12 0500 07/17/12 0556 07/18/12 0443  NA 136 136 137  K 3.9 3.4* 3.8  CL 108 107 108  CO2 19 20 19   GLUCOSE 113* 120* 114*  BUN 7 6 7   CREATININE 1.62* 1.70* 1.90*  CALCIUM 7.4* 7.3* 7.4*   LFT  Recent Labs  07/18/12 0443  ALBUMIN 2.3*     Studies/Results: US Renal  07/16/2012  *RADIOLOGY REPORT*  Clinical Data: Renal failure  RENAL/URINARY TRACT ULTRASOUND COMPLETE  Comparison:  None.  Findings:  Right Kidney:  Measures 14.6 cm.  No mass or hydronephrosis.  Left Kidney:  Measures 14.2 cm.  No mass or hydronephrosis.  Bladder:  Within normal limits.  Additional comments: Pelvic ascites.  IMPRESSION: Negative renal ultrasound.   Original Report Authenticated By: [name], M.D.     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 She is status post right radical mastectomy 06/08/2012 and completed her first course of chemotherapy on 07/07/2012 with carboplatin and Taxol. #2 severe diarrhea and anorexia with dehydration and acute kidney injury-secondary to chemotherapy induced colitis. Improving #3 acute kidney injury-secondary to above, nephrology following #4 pancytopenia-stable #5 adult onset diabetes mellitus #6 Autoimmue liver  disease with compensated cirrhosis #7 sleep apnea  PLAN: At this point she is improving and supportive management with antidiarrheals is appropriate. Consider adding Questran, over with her significant anorexia doubt she will tolerate Endoscopic evaluation Not indicated at this time, and unlikely to change management Encourage by mouth intake, and nutritional supplements if she can tolerate Will add Magic mouthwash swish and spit 5 cc 4 times daily Could discontinue Lipitor given hx of cirrhosis Will follow  along Dr. [name] to return tomorrow and will need to decide about any alternative chemotherapy regimen. Amy Esterwood  07/18/2012, 11:50 AM   [facility] Attending  I have also seen and assessed the patient and agree with the above note. She is improving after a very bad time with diarrhea that must have been related to chemotherapy side effects/toxicity. No other recommendations but will follow-up again tomorrow.  Iva Boop, MD, [facility] [phone] (pager) 07/18/2012 10:05 PM

## 2012-07-18 NOTE — Progress Notes (Addendum)
[facility] PROGRESS NOTE  [name] VWU:[id number] DOB: June 24, 1955 DOA: 07/12/2012 PCP: [name]., NP  Assessment/Plan: Neutropenic fever  -hold broad-spectrum antibiotics including vancomycin and Zosyn as BX NGTD and all other culture negative (day #5)  -Followup blood and urine cultures- negative so far -Followup stool cultures. C. difficile PCR negative. Diarrhea x 2 so far  today- no abd pain- patient claims not much better than when she came in - D/C Neupogen   A fib with RVR - off cardizem gtt -increase metoprolol -heparin : per cards: will change to renal dose xaralto when patient ready for d/c-appreciate cards assistance/heme onc - back in sinus on 3/9  Anorexia / Severe malnutrition in the context of acute illness / Obesity  -Patient requests something to stimulate her appetite. Low dose Remeron started.  -Dietician consult.  -Add Ensure.   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   HYPOTHYROIDISM  -Continue home dose of Synthroid.   DIABETES MELLITUS, TYPE II  -Check CBGs q. a.c. and cover with sliding scale insulin, sensitive scale. CBGs controlled -Continue Onglyza.   HYPERLIPIDEMIA  -Continue Lipitor.   DEPRESSION  -Continue Wellbutrin and Lexapro. Ativan as needed for breakthrough anxiety.   OBSTRUCTIVE SLEEP APNEA  -Continue CPAP each bedtime per home settings .  HYPERTENSION  -Continue Norvasc.   GERD  -Continue PPI therapy.   DVT, HX OF  -heparin gtt now per cardiology  Liver disease, chronic, with cirrhosis  -LFTs currently stable.   Cancer of breast  -Status post recent chemotherapy. Dr. [name] is out of town, can follow up with him post discharge.   Microcytic anemia / Thrombocytopenia  -Likely the sequela of recent chemotherapy. Monitor blood counts closely. Monitor for signs of bleeding. Transfuse as needed for  hemoglobin less than 7.      Code Status: full Family Communication: patient at bedside Disposition Plan:    Consultants:  cardiology  Procedures:  none  Antibiotics:    HPI/Subjective: +loose stools- watery and no appetite still   Objective: Filed Vitals:   07/17/12 2339 07/18/12 0609 07/18/12 0959 07/18/12 1250  BP: 107/60 114/64 114/54 115/56  Pulse:  73 73   Temp:  99 F (37.2 C) 98.6 F (37 C)   TempSrc:  Oral Oral   Resp:  20 19   Height:      Weight:      SpO2:  96% 94%     Intake/Output Summary (Last 24 hours) at 07/18/12 1342 Last data filed at 07/18/12 2956  Gross per 24 hour  Intake 2261.93 ml  Output      0 ml  Net 2261.93 ml   Filed Weights   07/12/12 1502 07/13/12 2340  Weight: 86.3 kg (190 lb 4.1 oz) 86.8 kg (191 lb 5.8 oz)    Exam:   General:  A+Ox3, NAD  Cardiovascular: regular  Respiratory: clear anterior  Abdomen: +BS, soft  NT  Musculoskeletal: moves all 4 ext    Data Reviewed: Basic Metabolic Panel:  Recent Labs Lab 07/12/12 1111 07/13/12 0550  07/14/12 0519 07/14/12 1100 07/15/12 0615 07/16/12 0500 07/17/12 0556 07/18/12 0443  NA 129* 130*  < > 137  --  137 136 136 137  K 3.1* 3.0*  < > 3.8  --  3.6 3.9 3.4* 3.8  CL 94* 97  < > 104  --  107 108 107 108  CO2 23 25  < >  22  --  21 19 20 19   GLUCOSE 153* 109*  < > 118*  --  113* 113* 120* 114*  BUN 8 5*  < > 7  --  8 7 6 7   CREATININE 0.51 0.47*  < > 1.00  --  1.59* 1.62* 1.70* 1.90*  CALCIUM 8.3* 7.5*  < > 7.9*  --  7.5* 7.4* 7.3* 7.4*  MG  --  1.7  --   --  2.0  --   --   --   --   PHOS  --   --   --   --   --   --   --   --  3.1  < > = values in this interval not displayed. Liver Function Tests:  Recent Labs Lab 07/12/12 1111 07/13/12 0550 07/18/12 0443  AST 35 20  --   ALT 57* 36*  --   ALKPHOS 143* 111  --   BILITOT 1.2 0.6  --   PROT 6.4 5.3*  --   ALBUMIN 3.2* 2.5* 2.3*   No results found for this basename: LIPASE, AMYLASE,  in the last  168 hours No results found for this basename: AMMONIA,  in the last 168 hours CBC:  Recent Labs Lab 07/13/12 2353 07/15/12 0615 07/16/12 0500 07/17/12 0556 07/18/12 0443  WBC 7.5 11.4* 8.6 8.8 9.4  NEUTROABS 5.5 8.8* 6.5 6.6 7.3  HGB 11.7* 11.4* 10.6* 10.4* 9.8*  HCT 35.1* 34.9* 32.6* 32.2* 30.9*  MCV 75.6* 76.7* 77.4* 77.8* 78.8  PLT 112* 131* 135* 129* 123*   Cardiac Enzymes:  Recent Labs Lab 07/13/12 2353 07/14/12 0519 07/14/12 1225  TROPONINI <0.30 <0.30 <0.30   BNP (last 3 results) No results found for this basename: PROBNP,  in the last 8760 hours CBG:  Recent Labs Lab 07/17/12 1153 07/17/12 1658 07/17/12 2143 07/18/12 0736 07/18/12 1201  GLUCAP 109* 110* 122* 99 110*    Recent Results (from the past 240 hour(s))  CULTURE, BLOOD (ROUTINE X 2)     Status: None   Collection Time    07/12/12 11:45 AM      Result Value Range Status   Specimen Description BLOOD LEFT FOREARM   Final   Special Requests BOTTLES DRAWN AEROBIC AND ANAEROBIC   Final   Culture  Setup Time 07/12/2012 15:29   Final   Culture     Final   Value:        BLOOD CULTURE RECEIVED NO GROWTH TO DATE CULTURE WILL BE HELD FOR 5 DAYS BEFORE ISSUING A FINAL NEGATIVE REPORT   Report Status PENDING   Incomplete  CULTURE, BLOOD (ROUTINE X 2)     Status: None   Collection Time    07/12/12 11:51 AM      Result Value Range Status   Specimen Description BLOOD EFT HAND   Final   Special Requests BOTTLES DRAWN AEROBIC AND ANAEROBIC   Final   Culture  Setup Time 07/12/2012 15:29   Final   Culture     Final   Value:        BLOOD CULTURE RECEIVED NO GROWTH TO DATE CULTURE WILL BE HELD FOR 5 DAYS BEFORE ISSUING A FINAL NEGATIVE REPORT   Report Status PENDING   Incomplete  CLOSTRIDIUM DIFFICILE BY PCR     Status: None   Collection Time    07/12/12  6:30 PM      Result Value Range Status   C difficile by pcr NEGATIVE  NEGATIVE Final  STOOL CULTURE     Status: None   Collection Time     07/12/12  6:33 PM      Result Value Range Status   Specimen Description STOOL   Final   Special Requests Immunocompromised   Final   Culture     Final   Value: NO SALMONELLA, SHIGELLA, CAMPYLOBACTER, YERSINIA, OR E.COLI 0157:H7 ISOLATED   Report Status 07/16/2012 FINAL   Final  URINE CULTURE     Status: None   Collection Time    07/14/12  8:55 PM      Result Value Range Status   Specimen Description URINE, CLEAN CATCH   Final   Special Requests Immunocompromised   Final   Culture  Setup Time 07/15/2012 02:42   Final   Colony Count NO GROWTH   Final   Culture NO GROWTH   Final   Report Status 07/16/2012 FINAL   Final     Studies: US Renal  07/16/2012  *RADIOLOGY REPORT*  Clinical Data: Renal failure  RENAL/URINARY TRACT ULTRASOUND COMPLETE  Comparison:  None.  Findings:  Right Kidney:  Measures 14.6 cm.  No mass or hydronephrosis.  Left Kidney:  Measures 14.2 cm.  No mass or hydronephrosis.  Bladder:  Within normal limits.  Additional comments: Pelvic ascites.  IMPRESSION: Negative renal ultrasound.   Original Report Authenticated By: [name], M.D.     Scheduled Meds: . aspirin EC  81 mg Oral q morning - 10a  . buPROPion  150 mg Oral q morning - 10a  . diltiazem  60 mg Oral Q6H  . diphenoxylate-atropine  1 tablet Oral QID  . escitalopram  10 mg Oral QHS  . feeding supplement  1 Container Oral BID BM  . insulin aspart  0-9 Units Subcutaneous TID WC  . levothyroxine  125 mcg Oral QAC breakfast  . linagliptin  5 mg Oral Daily  . magic mouthwash  5 mL Oral QID  . metoprolol succinate  75 mg Oral BID  . mirtazapine  7.5 mg Oral QHS  . pantoprazole  40 mg Oral Daily   Continuous Infusions: . sodium chloride 125 mL/hr at 07/18/12 0949  . heparin 1,700 Units/hr (07/18/12 0636)    Principal Problem:   Neutropenic fever Active Problems:   HYPOTHYROIDISM   DIABETES MELLITUS, TYPE II   HYPERLIPIDEMIA   DEPRESSION   OBSTRUCTIVE SLEEP APNEA   HYPERTENSION   GERD   DVT,  HX OF   Liver disease, chronic, with cirrhosis   Cancer of breast   Microcytic anemia   Thrombocytopenia   Hypokalemia   Hyponatremia   Obesity, unspecified   Severe malnutrition in the context of acute illness   A-fib    Time spent: 25    [facility], JESSICA  [facility] Pager [phone]. If 7PM-7AM, please contact night-coverage at [email or web address], password [facility] 07/18/2012, 1:42 PM  LOS: 6 days

## 2012-07-18 NOTE — Progress Notes (Signed)
Patient NOT eating or taking the feeding supplement, denies any N/V stated she does not feel like putting any food in her mouth, will drink water/juice.

## 2012-07-18 NOTE — Progress Notes (Signed)
Patient converted to NSR. HR - 70s. EKG to confirm. On-call MD notified, will continue to monitor.  [name]. Clelia Croft, RN

## 2012-07-18 NOTE — Progress Notes (Signed)
Subjective:  Feels fine, no complaints, no shortness of breath, no chest pain. Yesterday evening, converted to sinus rhythm.  Objective:  Vital Signs in the last 24 hours: Temp:  [98.4 F (36.9 C)-99 F (37.2 C)] 99 F (37.2 C) (03/09 0609) Pulse Rate:  [73-112] 73 (03/09 0609) Resp:  [18-20] 20 (03/09 0609) BP: (102-122)/(60-78) 114/64 mmHg (03/09 0609) SpO2:  [94 %-100 %] 96 % (03/09 0609)  Intake/Output from previous day: 03/08 0701 - 03/09 0700 In: 2436.9 [I.V.:2436.9] Out: -    Physical Exam: General: Well developed, well nourished, in no acute distress. Head:  Normocephalic and atraumatic. Lungs: Clear to auscultation and percussion. Port in place Heart: Normal S1 and S2.  No murmur, rubs or gallops.  Abdomen: soft, non-tender, positive bowel sounds. Extremities: No clubbing or cyanosis. No edema. Neurologic: Alert and oriented x 3.    Lab Results:  Recent Labs  07/17/12 0556 07/18/12 0443  WBC 8.8 9.4  HGB 10.4* 9.8*  PLT 129* 123*    Recent Labs  07/17/12 0556 07/18/12 0443  NA 136 137  K 3.4* 3.8  CL 107 108  CO2 20 19  GLUCOSE 120* 114*  BUN 6 7  CREATININE 1.70* 1.90*    Hepatic Function Panel  Recent Labs  07/18/12 0443  ALBUMIN 2.3*    Imaging: US Renal  07/16/2012  *RADIOLOGY REPORT*  Clinical Data: Renal failure  RENAL/URINARY TRACT ULTRASOUND COMPLETE  Comparison:  None.  Findings:  Right Kidney:  Measures 14.6 cm.  No mass or hydronephrosis.  Left Kidney:  Measures 14.2 cm.  No mass or hydronephrosis.  Bladder:  Within normal limits.  Additional comments: Pelvic ascites.  IMPRESSION: Negative renal ultrasound.   Original Report Authenticated By: [name], M.D.    Personally viewed.   Telemetry: Sinus rhythm, verified by EKG yesterday evening. Baseline challenging. Personally viewed.   EKG:  Normal sinus rhythm, converted from atrial fibrillation yesterday evening.  Cardiac Studies:  Ejection fraction  normal  Assessment/Plan:   1. Neutropenic fever with diarrhea  2. Atrial fibrillation with RVR, newly recognized now converted 3. NSVT  4. Breast CA s/p R mastectomy with lymph node involvement, recently completed first docetaxel/carboplatin treatment  5. Diabetes mellitus  6. HTN, controlled  7. Remote DVT 2006 while on HRT  8. AKI   PLAN  1. Atrial fibrillation: Patient converted to sinus rhythm, verified by EKG yesterday evening. Telemetry personally viewed still demonstrates sinus rhythm although baseline is difficult. CHADSVASC = 3 (HTN, DM, gender). I would still advocate anticoagulation for the time being if this is not felt to be too high risk from a hematologic standpoint. She may very well return to atrial fibrillation. GFR is currently low, hematology requests Pradaxa 75mg  BID. EF normal on echo. Watch for signs of bleeding.   - Increased Toprol XL to 75 mg bid  - Continue diltiazem 60 mg every 6 hrs  - continue heparin gtt for now, would tentatively plan on Pradaxa (see heme note) prior to discharge (stop heparin when Pradaxa begun).   2. AKI: Creatinine has been rising. Vancomycin stopped. Patient has had diarrhea and poor po intake in the past so may be at least partially due to dehydration. Encourage by mouth intake. IV fluids.    Chrystian Ressler 07/18/2012, 8:19 AM

## 2012-07-19 DIAGNOSIS — R630 Anorexia: Secondary | ICD-10-CM

## 2012-07-19 LAB — RENAL FUNCTION PANEL
Albumin: 2.2 g/dL — ABNORMAL LOW (ref 3.5–5.2)
CO2: 17 mEq/L — ABNORMAL LOW (ref 19–32)
Calcium: 7 mg/dL — ABNORMAL LOW (ref 8.4–10.5)
Chloride: 109 mEq/L (ref 96–112)
Creatinine, Ser: 1.83 mg/dL — ABNORMAL HIGH (ref 0.50–1.10)
GFR calc Af Amer: 34 mL/min — ABNORMAL LOW (ref >90)
GFR calc non Af Amer: 29 mL/min — ABNORMAL LOW (ref >90)
Sodium: 137 mEq/L (ref 135–145)

## 2012-07-19 LAB — CBC WITH DIFFERENTIAL/PLATELET
Basophils Absolute: 0 10*3/uL (ref 0.0–0.1)
Lymphocytes Relative: 12 % (ref 12–46)
Lymphs Abs: 1 10*3/uL (ref 0.7–4.0)
Neutro Abs: 7.1 10*3/uL (ref 1.7–7.7)
Neutrophils Relative %: 81 % — ABNORMAL HIGH (ref 43–77)
Platelets: 120 10*3/uL — ABNORMAL LOW (ref 150–400)
RBC: 3.69 MIL/uL — ABNORMAL LOW (ref 3.87–5.11)
RDW: 16.6 % — ABNORMAL HIGH (ref 11.5–15.5)
WBC: 8.8 10*3/uL (ref 4.0–10.5)

## 2012-07-19 LAB — GLUCOSE, CAPILLARY
Glucose-Capillary: 100 mg/dL — ABNORMAL HIGH (ref 70–99)
Glucose-Capillary: 141 mg/dL — ABNORMAL HIGH (ref 70–99)
Glucose-Capillary: 160 mg/dL — ABNORMAL HIGH (ref 70–99)

## 2012-07-19 LAB — HEPARIN LEVEL (UNFRACTIONATED): Heparin Unfractionated: 0.38 IU/mL (ref 0.30–0.70)

## 2012-07-19 MED ORDER — POTASSIUM CHLORIDE CRYS ER 20 MEQ PO TBCR
40.0000 meq | EXTENDED_RELEASE_TABLET | Freq: Once | ORAL | Status: AC
  Administered 2012-07-19: 40 meq via ORAL
  Filled 2012-07-19: qty 2

## 2012-07-19 MED ORDER — DABIGATRAN ETEXILATE MESYLATE 75 MG PO CAPS
75.0000 mg | ORAL_CAPSULE | Freq: Two times a day (BID) | ORAL | Status: DC
  Administered 2012-07-19 (×2): 75 mg via ORAL
  Filled 2012-07-19 (×4): qty 1

## 2012-07-19 MED ORDER — POTASSIUM CHLORIDE CRYS ER 20 MEQ PO TBCR
20.0000 meq | EXTENDED_RELEASE_TABLET | Freq: Three times a day (TID) | ORAL | Status: DC
  Administered 2012-07-19 – 2012-07-20 (×5): 20 meq via ORAL
  Filled 2012-07-19 (×8): qty 1

## 2012-07-19 MED ORDER — DILTIAZEM HCL ER COATED BEADS 120 MG PO CP24
120.0000 mg | ORAL_CAPSULE | Freq: Every day | ORAL | Status: DC
  Administered 2012-07-19 – 2012-07-20 (×2): 120 mg via ORAL
  Filled 2012-07-19 (×3): qty 1

## 2012-07-19 MED ORDER — SACCHAROMYCES BOULARDII 250 MG PO CAPS
250.0000 mg | ORAL_CAPSULE | Freq: Two times a day (BID) | ORAL | Status: DC
  Administered 2012-07-19 – 2012-07-20 (×4): 250 mg via ORAL
  Filled 2012-07-19 (×6): qty 1

## 2012-07-19 NOTE — Progress Notes (Signed)
ANTICOAGULATION CONSULT NOTE - Follow Up  Pharmacy Consult for IV heparin Indication: atrial fibrillation  Allergies  Allergen Reactions  . Olmesartan Medoxomil     REACTION: ? if cough  . Tetracycline Hcl     REACTION: unspecified  . Venlafaxine     REACTION: severe dry moouth  . Adhesive (Tape) Rash    Patient Measurements: Height: 5\' 4"  (162.6 cm) Weight: 191 lb 12.8 oz (87 kg) IBW/kg (Calculated) : 54.7 Heparin Dosing Weight: 74 kg  Vital Signs: Temp: 98.8 F (37.1 C) (03/10 0500) Temp src: Oral (03/10 0500) BP: 115/54 mmHg (03/10 0500) Pulse Rate: 74 (03/10 0500)  Labs:  Recent Labs  07/17/12 0556 07/18/12 0443 07/19/12 0501  HGB 10.4* 9.8* 9.3*  HCT 32.2* 30.9* 28.9*  PLT 129* 123* 120*  HEPARINUNFRC 0.57 0.47 0.38  CREATININE 1.70* 1.90* 1.83*    Estimated Creatinine Clearance: 35.8 ml/min (by C-G formula based on Cr of 1.83).   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 Patient found with new onset afib with RVR 3/4. Cardiology on board, CHADSVASC = 3 (HTN, DM, gender) - IV heparin started 3/5.    IV heparin currently running at 1700 units/hr.  Heparin level remains therapeutic this am. Platelets are low but stable. Hgb continues slow trend down.  No bleeding events noted in chart notes.   Current plan (per cards note) is to switch to dabigatran (Pardaxa) 75mg  PO BID for Afib.  SCr is improving, current CrCl is ~ 33ml/min. Usual dose if Pradaxa 150mg  PO BID for CrCl>38ml/min (as long as patient not on concurrent dronedarone or ketoconazole)      Goal of Therapy:  Heparin level 0.3-0.7 units/ml Monitor platelets by anticoagulation protocol: Yes   Plan:   Continue IV heparin at 1700 units/hr  Daily heparin level and CBC  Await decision to change to Pradaxa   Lynann Beaver PharmD, BCPS Pager [phone] 07/19/2012 7:23 AM

## 2012-07-19 NOTE — Progress Notes (Signed)
    Subjective:  No c/o this am. No CP, palps, or dyspnea.  Objective:  Vital Signs in the last 24 hours: Temp:  [98.6 F (37 C)-100.6 F (38.1 C)] 98.8 F (37.1 C) (03/10 0500) Pulse Rate:  [70-78] 74 (03/10 0500) Resp:  [18-20] 20 (03/10 0500) BP: (106-120)/(48-73) 115/54 mmHg (03/10 0500) SpO2:  [85 %-95 %] 93 % (03/10 0500) Weight:  [87 kg (191 lb 12.8 oz)] 87 kg (191 lb 12.8 oz) (03/10 0500)  Intake/Output from previous day: 03/09 0701 - 03/10 0700 In: 921 [P.O.:200; I.V.:721] Out: 650 [Urine:650]  Physical Exam: Pt is alert and oriented, NAD HEENT: normal Neck: JVP - normal Lungs: CTA bilaterally CV: RRR without murmur or gallop Abd: soft, NT, Positive BS Ext: no C/C/E, distal pulses intact and equal Skin: warm/dry no rash   Lab Results:  Recent Labs  07/18/12 0443 07/19/12 0501  WBC 9.4 8.8  HGB 9.8* 9.3*  PLT 123* 120*    Recent Labs  07/18/12 0443 07/19/12 0501  NA 137 137  K 3.8 3.2*  CL 108 109  CO2 19 17*  GLUCOSE 114* 101*  BUN 7 8  CREATININE 1.90* 1.83*   No results found for this basename: TROPONINI, CK, MB,  in the last 72 hours  Cardiac Studies: 2-D echo: Study Conclusions  - Left ventricle: The cavity size was normal. Wall thickness was normal. Systolic function was normal. The estimated ejection fraction was in the range of 60% to 65%. Wall motion was normal; there were no regional wall motion abnormalities. Indeterminant diastolic function (atrial fibrillation). - Aortic valve: Poorly visualized. There was no stenosis. - Mitral valve: Trivial regurgitation. - Left atrium: The atrium was mildly dilated. - Right ventricle: The cavity size was normal. Systolic function was normal. - Tricuspid valve: Peak RV-RA gradient: 24mm Hg (S). - Pulmonary arteries: PA peak pressure: 29mm Hg (S). - Inferior vena cava: The vessel was normal in size; the respirophasic diameter changes were in the normal range (= 50%); findings are  consistent with normal central venous pressure. Impressions:  - The patient was in atrial fibrillation. Normal LV size with EF 60-65%. Normal RV size and systolic function. No significant valvular abnormalities.  Tele: Sinus rhythm, no atrial fibrillation last 24 hours.  Assessment/Plan:  Atrial fibrillation. The patient is now maintaining sinus rhythm. Agree the patient has significant ongoing stroke risk considering hypertension, diabetes, and female gender. Renal dosed pradaxa has been recommended but not yet started. I will stop her heparin this am and start pradaxa 75 mg BID as per prior recommendation. Will stop ASA and consolidate diltiazem as well.   Other issues per primary service. Please call if any other cardiac issues arise.  Tonny Bollman, M.D. 07/19/2012, 7:24 AM

## 2012-07-19 NOTE — Progress Notes (Signed)
Pt says she has been wearing cpap for years & doesn't require assistance from [name].  Jacqulynn Cadet RRT

## 2012-07-19 NOTE — Progress Notes (Signed)
[facility] PROGRESS NOTE  [name] ZOX:[id number] DOB: Jul 29, 1955 DOA: 07/12/2012 PCP: [name]., NP  Assessment/Plan: Neutropenic fever  -hold broad-spectrum antibiotics including vancomycin and Zosyn as BX NGTD and all other culture negative (day #5)  -Followup blood and urine cultures- negative so far -Followup stool cultures. C. difficile PCR negative. Diarrhea improved- no abd pain- - D/C Neupogen   A fib with RVR - off cardizem gtt -increase metoprolol -heparin : per cards: will change to renal dose xaralto when patient ready for d/c-appreciate cards assistance/heme onc - back in sinus on 3/9  Anorexia / Severe malnutrition in the context of acute illness / Obesity  -Patient requests something to stimulate her appetite. Low dose Remeron started.  -Dietician consult.  -Add Ensure. 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   HYPOTHYROIDISM  -Continue home dose of Synthroid.   DIABETES MELLITUS, TYPE II  -Check CBGs q. a.c. and cover with sliding scale insulin, sensitive scale. CBGs controlled -Continue Onglyza.   HYPERLIPIDEMIA  -Continue Lipitor.   DEPRESSION  -Continue Wellbutrin and Lexapro. Ativan as needed for breakthrough anxiety.   OBSTRUCTIVE SLEEP APNEA  -Continue CPAP each bedtime per home settings .  HYPERTENSION  -Continue Norvasc.   GERD  -Continue PPI therapy.   DVT, HX OF  -heparin gtt now per cardiology  Liver disease, chronic, with cirrhosis  -LFTs currently stable.   Cancer of breast  -Status post recent chemotherapy. Dr. [name] alerted of admission   Microcytic anemia / Thrombocytopenia  -Likely the sequela of recent chemotherapy. Monitor blood counts closely. Monitor for signs of bleeding. Transfuse as needed for hemoglobin less than 7.      Code Status: full Family  Communication: patient at bedside Disposition Plan:    Consultants:  cardiology  Procedures:  none  Antibiotics:    HPI/Subjective: Feeling well No c/o   Objective: Filed Vitals:   07/19/12 0035 07/19/12 0257 07/19/12 0500 07/19/12 1000  BP: 106/73 112/61 115/54 125/48  Pulse: 70 72 74 77  Temp:  99.1 F (37.3 C) 98.8 F (37.1 C) 99.3 F (37.4 C)  TempSrc:  Oral Oral Oral  Resp:  18 20 18   Height:      Weight:   87 kg (191 lb 12.8 oz)   SpO2:  95% 93% 94%    Intake/Output Summary (Last 24 hours) at 07/19/12 1258 Last data filed at 07/19/12 1027  Gross per 24 hour  Intake 2053.56 ml  Output    900 ml  Net 1153.56 ml   Filed Weights   07/12/12 1502 07/13/12 2340 07/19/12 0500  Weight: 86.3 kg (190 lb 4.1 oz) 86.8 kg (191 lb 5.8 oz) 87 kg (191 lb 12.8 oz)    Exam:   General:  A+Ox3, NAD  Cardiovascular: regular  Respiratory: clear anterior  Abdomen: +BS, soft  NT  Musculoskeletal: moves all 4 ext    Data Reviewed: Basic Metabolic Panel:  Recent Labs Lab 07/13/12 0550  07/14/12 1100 07/15/12 0615 07/16/12 0500 07/17/12 0556 07/18/12 0443 07/19/12 0501  NA 130*  < >  --  137 136 136 137 137  K 3.0*  < >  --  3.6 3.9 3.4* 3.8 3.2*  CL 97  < >  --  107 108 107 108 109  CO2 25  < >  --  21 19 20 19  17*  GLUCOSE 109*  < >  --  113* 113* 120* 114* 101*  BUN 5*  < >  --  8 7 6 7 8   CREATININE 0.47*  < >  --  1.59* 1.62* 1.70* 1.90* 1.83*  CALCIUM 7.5*  < >  --  7.5* 7.4* 7.3* 7.4* 7.0*  MG 1.7  --  2.0  --   --   --   --   --   PHOS  --   --   --   --   --   --  3.1 3.1  < > = values in this interval not displayed. Liver Function Tests:  Recent Labs Lab 07/13/12 0550 07/18/12 0443 07/19/12 0501  AST 20  --   --   ALT 36*  --   --   ALKPHOS 111  --   --   BILITOT 0.6  --   --   PROT 5.3*  --   --   ALBUMIN 2.5* 2.3* 2.2*   No results found for this basename: LIPASE, AMYLASE,  in the last 168 hours No results found for this  basename: AMMONIA,  in the last 168 hours CBC:  Recent Labs Lab 07/15/12 0615 07/16/12 0500 07/17/12 0556 07/18/12 0443 07/19/12 0501  WBC 11.4* 8.6 8.8 9.4 8.8  NEUTROABS 8.8* 6.5 6.6 7.3 7.1  HGB 11.4* 10.6* 10.4* 9.8* 9.3*  HCT 34.9* 32.6* 32.2* 30.9* 28.9*  MCV 76.7* 77.4* 77.8* 78.8 78.3  PLT 131* 135* 129* 123* 120*   Cardiac Enzymes:  Recent Labs Lab 07/13/12 2353 07/14/12 0519 07/14/12 1225  TROPONINI <0.30 <0.30 <0.30   BNP (last 3 results) No results found for this basename: PROBNP,  in the last 8760 hours CBG:  Recent Labs Lab 07/18/12 1201 07/18/12 1705 07/18/12 2112 07/19/12 0743 07/19/12 1148  GLUCAP 110* 105* 113* 100* 141*    Recent Results (from the past 240 hour(s))  CULTURE, BLOOD (ROUTINE X 2)     Status: None   Collection Time    07/12/12 11:45 AM      Result Value Range Status   Specimen Description BLOOD LEFT FOREARM   Final   Special Requests BOTTLES DRAWN AEROBIC AND ANAEROBIC   Final   Culture  Setup Time 07/12/2012 15:29   Final   Culture NO GROWTH 5 DAYS   Final   Report Status 07/18/2012 FINAL   Final  CULTURE, BLOOD (ROUTINE X 2)     Status: None   Collection Time    07/12/12 11:51 AM      Result Value Range Status   Specimen Description BLOOD EFT HAND   Final   Special Requests BOTTLES DRAWN AEROBIC AND ANAEROBIC   Final   Culture  Setup Time 07/12/2012 15:29   Final   Culture NO GROWTH 5 DAYS   Final   Report Status 07/18/2012 FINAL   Final  CLOSTRIDIUM DIFFICILE BY PCR     Status: None   Collection Time    07/12/12  6:30 PM      Result Value Range Status   C difficile by pcr NEGATIVE  NEGATIVE Final  STOOL CULTURE     Status: None   Collection Time    07/12/12  6:33 PM      Result Value Range Status   Specimen Description STOOL   Final   Special Requests Immunocompromised   Final   Culture     Final   Value: NO SALMONELLA, SHIGELLA, CAMPYLOBACTER, YERSINIA, OR E.COLI 0157:H7 ISOLATED   Report Status  07/16/2012 FINAL   Final  URINE CULTURE     Status: None   Collection Time    07/14/12  8:55 PM      Result Value Range Status   Specimen Description URINE, CLEAN CATCH   Final   Special Requests Immunocompromised   Final   Culture  Setup Time 07/15/2012 02:42   Final   Colony Count NO GROWTH   Final   Culture NO GROWTH   Final   Report Status 07/16/2012 FINAL   Final     Studies: No results found.  Scheduled Meds: . buPROPion  150 mg Oral q morning - 10a  . dabigatran  75 mg Oral Q12H  . diltiazem  120 mg Oral Daily  . diphenoxylate-atropine  1 tablet Oral QID  . escitalopram  10 mg Oral QHS  . feeding supplement  1 Container Oral BID BM  . insulin aspart  0-9 Units Subcutaneous TID WC  . levothyroxine  125 mcg Oral QAC breakfast  . linagliptin  5 mg Oral Daily  . magic mouthwash  5 mL Oral QID  . metoprolol succinate  75 mg Oral BID  . mirtazapine  7.5 mg Oral QHS  . pantoprazole  40 mg Oral Daily   Continuous Infusions: . sodium chloride 125 mL/hr at 07/19/12 1610    Principal Problem:   Neutropenic fever Active Problems:   HYPOTHYROIDISM   DIABETES MELLITUS, TYPE II   HYPERLIPIDEMIA   DEPRESSION   OBSTRUCTIVE SLEEP APNEA   HYPERTENSION   GERD   DVT, HX OF   Liver disease, chronic, with cirrhosis   Cancer of breast   Microcytic anemia   Thrombocytopenia   Hypokalemia   Hyponatremia   Obesity, unspecified   Severe malnutrition in the context of acute illness   A-fib    Time spent: 25    [facility], JESSICA  [facility] Pager [phone]. If 7PM-7AM, please contact night-coverage at [email or web address], password [email or web address] 07/19/2012, 12:58 PM  LOS: 7 days

## 2012-07-19 NOTE — Progress Notes (Signed)
.   [facility] Progress Note  SUBJECTIVE: diarrhea better but cannot eat because of gun metal taste in mouth  OBJECTIVE:  Vital signs in last 24 hours: Temp:  [98.6 F (37 C)-100.6 F (38.1 C)] 98.8 F (37.1 C) (03/10 0500) Pulse Rate:  [70-78] 74 (03/10 0500) Resp:  [18-20] 20 (03/10 0500) BP: (106-120)/(48-73) 115/54 mmHg (03/10 0500) SpO2:  [85 %-95 %] 93 % (03/10 0500) Weight:  [191 lb 12.8 oz (87 kg)] 191 lb 12.8 oz (87 kg) (03/10 0500) Last BM Date: 07/19/12 General:    Pleasant white female in NAD Heart:  Occasional irregular beat. Murmur present Abdomen:  Soft, nontender and nondistended. Normal bowel sounds. Extremities:  Without edema. Neurologic:  Alert and oriented,  grossly normal neurologically. Psych:  Cooperative. Normal mood and affect.   Lab Results:  Recent Labs  07/17/12 0556 07/18/12 0443 07/19/12 0501  WBC 8.8 9.4 8.8  HGB 10.4* 9.8* 9.3*  HCT 32.2* 30.9* 28.9*  PLT 129* 123* 120*   BMET  Recent Labs  07/17/12 0556 07/18/12 0443 07/19/12 0501  NA 136 137 137  K 3.4* 3.8 3.2*  CL 107 108 109  CO2 20 19 17*  GLUCOSE 120* 114* 101*  BUN 6 7 8   CREATININE 1.70* 1.90* 1.83*  CALCIUM 7.3* 7.4* 7.0*   LFT  Recent Labs  07/19/12 0501  ALBUMIN 2.2*     ASSESSMENT / PLAN:  1. Diarrhea with associated dehydration and ARI. Renal function improving. Diarrhea improving with stools being less frequent and smaller volume now.   2. Neutropenic fever,. Tmax 100.6. C-diff negative. Urine culture negative, CXR negative. WBC now in normal range after Neupogen. No longer on antibiotics  3.  AIH, stable at present. Followed outpatient.   4. Atrial fib, new. Cardiology following, to start Pradaxa.  5. Anorexia. She doesn't want to eat. Food / drinks cause "gun metal" taste in mouth since starting chemotherapy. Low dose Remeron was started. Dr. [name] has been out of town. Patient due for chemotherapy on Thursday. It is concerning  that she cannot eat / drink anything because of metallic taste in mouth. If this is chemo related, what are her options?     LOS: 7 days   Willette Cluster  07/19/2012, 8:24 AM   [facility] Attending  I have also seen and assessed the patient and agree with the above note. Diarrhea resolving. Defer other management to Dr. [name] and hospitalist. We will sign off at this point.  Iva Boop, MD, [facility] [phone] (pager) 07/19/2012 12:32 PM

## 2012-07-19 NOTE — Progress Notes (Signed)
[name]   DOB:[birth year]   GN#:[id number]   [id number]  Subjective: she is very concerned about the loss of appetite and taste perversion--no nausea or vomiting; no odynophagia; food "just doesn't want to go down;" diarrhea still present, with several very loose BMs daily; feels tired and anxious; husband in room   Objective: middle-aged white woman examined in bed Filed Vitals:   07/19/12 1000  BP: 125/48  Pulse: 77  Temp: 99.3 F (37.4 C)  Resp: 18    Body mass index is 32.91 kg/(m^2).  Intake/Output Summary (Last 24 hours) at 07/19/12 1302 Last data filed at 07/19/12 1027  Gross per 24 hour  Intake 2053.56 ml  Output    900 ml  Net 1153.56 ml     Sclerae unicteric  Oropharynx clear--no thrush or sores  No peripheral adenopathy  Lungs clear -- no rales or rhonchi  Heart regular rate, no fib noted  Abdomen soft, +BS, NT, no masses palpated  MSK no focal spinal tenderness  Neuro nonfocal, well-oriented  Breast exam: deferred  CBG (last 3)   Recent Labs  07/18/12 2112 07/19/12 0743 07/19/12 1148  GLUCAP 113* 100* 141*     Labs:  Lab Results  Component Value Date   WBC 8.8 07/19/2012   HGB 9.3* 07/19/2012   HCT 28.9* 07/19/2012   MCV 78.3 07/19/2012   PLT 120* 07/19/2012   NEUTROABS 7.1 07/19/2012    @LASTCHEMISTRY @  Urine Studies No results found for this basename: UACOL, UAPR, USPG, UPH, UTP, UGL, UKET, UBIL, UHGB, UNIT, UROB, ULEU, UEPI, UWBC, URBC, UBAC, CAST, CRYS, UCOM, BILUA,  in the last 72 hours  Basic Metabolic Panel:  Recent Labs Lab 07/13/12 0550  07/14/12 1100 07/15/12 0615 07/16/12 0500 07/17/12 0556 07/18/12 0443 07/19/12 0501  NA 130*  < >  --  137 136 136 137 137  K 3.0*  < >  --  3.6 3.9 3.4* 3.8 3.2*  CL 97  < >  --  107 108 107 108 109  CO2 25  < >  --  21 19 20 19  17*  GLUCOSE 109*  < >  --  113* 113* 120* 114* 101*  BUN 5*  < >  --  8 7 6 7 8   CREATININE 0.47*  < >  --  1.59* 1.62* 1.70* 1.90* 1.83*  CALCIUM 7.5*  <  >  --  7.5* 7.4* 7.3* 7.4* 7.0*  MG 1.7  --  2.0  --   --   --   --   --   PHOS  --   --   --   --   --   --  3.1 3.1  < > = values in this interval not displayed. GFR Estimated Creatinine Clearance: 35.8 ml/min (by C-G formula based on Cr of 1.83). Liver Function Tests:  Recent Labs Lab 07/13/12 0550 07/18/12 0443 07/19/12 0501  AST 20  --   --   ALT 36*  --   --   ALKPHOS 111  --   --   BILITOT 0.6  --   --   PROT 5.3*  --   --   ALBUMIN 2.5* 2.3* 2.2*   No results found for this basename: LIPASE, AMYLASE,  in the last 168 hours No results found for this basename: AMMONIA,  in the last 168 hours Coagulation profile  Recent Labs Lab 07/14/12 1100  INR 1.32    CBC:  Recent Labs Lab 07/15/12 0615 07/16/12  0500 07/17/12 0556 07/18/12 0443 07/19/12 0501  WBC 11.4* 8.6 8.8 9.4 8.8  NEUTROABS 8.8* 6.5 6.6 7.3 7.1  HGB 11.4* 10.6* 10.4* 9.8* 9.3*  HCT 34.9* 32.6* 32.2* 30.9* 28.9*  MCV 76.7* 77.4* 77.8* 78.8 78.3  PLT 131* 135* 129* 123* 120*   Cardiac Enzymes:  Recent Labs Lab 07/13/12 2353 07/14/12 0519 07/14/12 1225  TROPONINI <0.30 <0.30 <0.30   BNP: No components found with this basename: POCBNP,  CBG:  Recent Labs Lab 07/18/12 1201 07/18/12 1705 07/18/12 2112 07/19/12 0743 07/19/12 1148  GLUCAP 110* 105* 113* 100* 141*   D-Dimer No results found for this basename: DDIMER,  in the last 72 hours Hgb A1c No results found for this basename: HGBA1C,  in the last 72 hours Lipid Profile No results found for this basename: CHOL, HDL, LDLCALC, TRIG, CHOLHDL, LDLDIRECT,  in the last 72 hours Thyroid function studies No results found for this basename: TSH, T4TOTAL, FREET3, T3FREE, THYROIDAB,  in the last 72 hours Anemia work up No results found for this basename: VITAMINB12, FOLATE, FERRITIN, TIBC, IRON, RETICCTPCT,  in the last 72 hours Microbiology Recent Results (from the past 240 hour(s))  CULTURE, BLOOD (ROUTINE X 2)     Status: None    Collection Time    07/12/12 11:45 AM      Result Value Range Status   Specimen Description BLOOD LEFT FOREARM   Final   Special Requests BOTTLES DRAWN AEROBIC AND ANAEROBIC   Final   Culture  Setup Time 07/12/2012 15:29   Final   Culture NO GROWTH 5 DAYS   Final   Report Status 07/18/2012 FINAL   Final  CULTURE, BLOOD (ROUTINE X 2)     Status: None   Collection Time    07/12/12 11:51 AM      Result Value Range Status   Specimen Description BLOOD EFT HAND   Final   Special Requests BOTTLES DRAWN AEROBIC AND ANAEROBIC   Final   Culture  Setup Time 07/12/2012 15:29   Final   Culture NO GROWTH 5 DAYS   Final   Report Status 07/18/2012 FINAL   Final  CLOSTRIDIUM DIFFICILE BY PCR     Status: None   Collection Time    07/12/12  6:30 PM      Result Value Range Status   C difficile by pcr NEGATIVE  NEGATIVE Final  STOOL CULTURE     Status: None   Collection Time    07/12/12  6:33 PM      Result Value Range Status   Specimen Description STOOL   Final   Special Requests Immunocompromised   Final   Culture     Final   Value: NO SALMONELLA, SHIGELLA, CAMPYLOBACTER, YERSINIA, OR E.COLI 0157:H7 ISOLATED   Report Status 07/16/2012 FINAL   Final  URINE CULTURE     Status: None   Collection Time    07/14/12  8:55 PM      Result Value Range Status   Specimen Description URINE, CLEAN CATCH   Final   Special Requests Immunocompromised   Final   Culture  Setup Time 07/15/2012 02:42   Final   Colony Count NO GROWTH   Final   Culture NO GROWTH   Final   Report Status 07/16/2012 FINAL   Final      Studies:  No results found.  Assessment/Plan: 57 y.o. [name] admitted with diarrhea, fever/neutropenia, and subsequent AFib  (1) status post right mastectomy under the  care of Dr. 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  Lymphovascular and appearing neural invasion was identified. 2 of 18 lymph nodes were involved. Margins were clear. Pathologic staging pT3, pN1a, stage IIIA. (Note the original clinical staging was IIB.)  (2) being treated in the adjuvant setting, the goal being to complete 6 q. three-week doses of docetaxel/carboplatin beginning 07/06/2012, with Neulasta on day 2 for granulocyte support  (3) patient will need postmastectomy radiation, which will be followed by antiestrogen therapy.  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   (5) diarrhea is improved but not resolved--2 BMs so far today, not normal consistency; C diff negative. -- This is unlikely to be due to chemotherapy, though the temporary immunosuppression may have facilitated infection (6) anorexia; taste perversion; no nausea or vomiting--the taste perversion may have been worsened by ABX but is common from chemotherapy and takes months to resolve after chemotherapy completed; I have ecouraged her to experiment and in particular suggested she try to drink 1 quart of liquid (preferably gatoraide) daily--otherwise her discharge will be further delayed (7) deconditioning: I have encouraged her to get OOB and to ambulate in halls TID with her husband (8) A fib: wonder if she needs long-term anticoagulation given the multiple stresses leading to the Afib--will let cardiology make the final decision, but I would not be uncomfortable with ASA 81 mg daily long term  Her next chemotherapy is due 03/20 and she has an appt with [name] 03/18. I will likely stop at 4 doses rather than the planned 6 and drop her dose for the next cycle. Will follow with you   MAGRINAT,GUSTAV C 07/19/2012

## 2012-07-19 NOTE — Progress Notes (Signed)
Subjective: Feels better, no complaints  Objective Vital signs in last 24 hours: Filed Vitals:   07/19/12 0257 07/19/12 0500 07/19/12 1000 07/19/12 1445  BP: 112/61 115/54 125/48 141/69  Pulse: 72 74 77 81  Temp: 99.1 F (37.3 C) 98.8 F (37.1 C) 99.3 F (37.4 C) 100 F (37.8 C)  TempSrc: Oral Oral Oral Oral  Resp: 18 20 18 18   Height:      Weight:  87 kg (191 lb 12.8 oz)    SpO2: 95% 93% 94% 97%   Weight change:   Intake/Output Summary (Last 24 hours) at 07/19/12 1803 Last data filed at 07/19/12 1300  Gross per 24 hour  Intake 1960.56 ml  Output    750 ml  Net 1210.56 ml   Labs: Basic Metabolic Panel:  Recent Labs Lab 07/16/12 0500 07/17/12 0556 07/18/12 0443 07/19/12 0501  NA 136 136 137 137  K 3.9 3.4* 3.8 3.2*  CL 108 107 108 109  CO2 19 20 19  17*  GLUCOSE 113* 120* 114* 101*  BUN 7 6 7 8   CREATININE 1.62* 1.70* 1.90* 1.83*  CALCIUM 7.4* 7.3* 7.4* 7.0*  PHOS  --   --  3.1 3.1   Liver Function Tests:  Recent Labs Lab 07/13/12 0550 07/18/12 0443 07/19/12 0501  AST 20  --   --   ALT 36*  --   --   ALKPHOS 111  --   --   BILITOT 0.6  --   --   PROT 5.3*  --   --   ALBUMIN 2.5* 2.3* 2.2*   No results found for this basename: LIPASE, AMYLASE,  in the last 168 hours No results found for this basename: AMMONIA,  in the last 168 hours CBC:  Recent Labs Lab 07/16/12 0500 07/17/12 0556 07/18/12 0443 07/19/12 0501  WBC 8.6 8.8 9.4 8.8  NEUTROABS 6.5 6.6 7.3 7.1  HGB 10.6* 10.4* 9.8* 9.3*  HCT 32.6* 32.2* 30.9* 28.9*  MCV 77.4* 77.8* 78.8 78.3  PLT 135* 129* 123* 120*   PT/INR: @LABRCNTIP (inr:5)   Scheduled Meds ) . buPROPion  150 mg Oral q morning - 10a  . dabigatran  75 mg Oral Q12H  . diltiazem  120 mg Oral Daily  . diphenoxylate-atropine  1 tablet Oral QID  . escitalopram  10 mg Oral QHS  . feeding supplement  1 Container Oral BID BM  . insulin aspart  0-9 Units Subcutaneous TID WC  . levothyroxine  125 mcg Oral QAC breakfast  .  linagliptin  5 mg Oral Daily  . magic mouthwash  5 mL Oral QID  . metoprolol succinate  75 mg Oral BID  . mirtazapine  7.5 mg Oral QHS  . pantoprazole  40 mg Oral Daily  . potassium chloride  20 mEq Oral TID  . saccharomyces boulardii  250 mg Oral BID    Physical Exam:  Blood pressure 141/69, pulse 81, temperature 100 F (37.8 C), temperature source Oral, resp. rate 18, height 5\' 4"  (1.626 m), weight 87 kg (191 lb 12.8 oz), SpO2 97.00%.  Gen:WD WN WF in NAD  CVS:IRR IRR no rub  Resp:CTA  Abd:+bs, soft, NT/ND  JYN:WGNFA pretib edema  Assessment/Plan:  1. AKI: due to ischemic ATN from volume depletion (anorexia/diarrhea) and hypotension from A fib with RVR. FeNa was 0.8%.  Creat down 1.9 > 1.8 today. Baseline was 0.5-0.7 from Jan-Feb '14. No sign of vol overload, urine not being recorded, weight stable. Cont IVF"s for now.  2.  Hyponatremia- improved 3. Neutropenic fever- off vanc and zosyn, afebrile, cont to follow 4. Diarrhea- match intake with output, C Diff PCR neg 5. Thrombocytopenia- follow/stable 6. Anemia of malignancy/chemo (s/p 1st carbo/taxol chemo) 7. Hypotension- improved. 8. A fib- still with RVR, plan per cards 9. Breast cancer 10. Dispo- per primary svc   Vinson Moselle  MD [phone] pgr    [phone] cell 07/19/2012, 6:03 PM

## 2012-07-20 ENCOUNTER — Other Ambulatory Visit: Payer: 59 | Admitting: Lab

## 2012-07-20 ENCOUNTER — Ambulatory Visit: Payer: 59

## 2012-07-20 LAB — RENAL FUNCTION PANEL
Albumin: 2.1 g/dL — ABNORMAL LOW (ref 3.5–5.2)
Calcium: 7.2 mg/dL — ABNORMAL LOW (ref 8.4–10.5)
GFR calc Af Amer: 37 mL/min — ABNORMAL LOW (ref >90)
GFR calc non Af Amer: 32 mL/min — ABNORMAL LOW (ref >90)
Phosphorus: 2.3 mg/dL (ref 2.3–4.6)
Potassium: 2.9 mEq/L — ABNORMAL LOW (ref 3.5–5.1)
Sodium: 138 mEq/L (ref 135–145)

## 2012-07-20 LAB — CBC WITH DIFFERENTIAL/PLATELET
Basophils Absolute: 0.1 10*3/uL (ref 0.0–0.1)
Eosinophils Absolute: 0 10*3/uL (ref 0.0–0.7)
HCT: 27.6 % — ABNORMAL LOW (ref 36.0–46.0)
Lymphocytes Relative: 8 % — ABNORMAL LOW (ref 12–46)
MCHC: 32.6 g/dL (ref 30.0–36.0)
Monocytes Relative: 6 % (ref 3–12)
Neutrophils Relative %: 85 % — ABNORMAL HIGH (ref 43–77)
RDW: 16.9 % — ABNORMAL HIGH (ref 11.5–15.5)
WBC: 7.6 10*3/uL (ref 4.0–10.5)

## 2012-07-20 MED ORDER — BENEPROTEIN PO POWD: 1.0000 | Freq: Three times a day (TID) | ORAL | Status: DC

## 2012-07-20 MED ORDER — ASPIRIN EC 81 MG PO TBEC
81.0000 mg | DELAYED_RELEASE_TABLET | Freq: Every day | ORAL | Status: DC
  Administered 2012-07-20: 81 mg via ORAL
  Filled 2012-07-20 (×2): qty 1

## 2012-07-20 MED ORDER — RESOURCE INSTANT PROTEIN PO PWD PACKET
1.0000 | Freq: Three times a day (TID) | ORAL | Status: DC
  Filled 2012-07-20 (×5): qty 6

## 2012-07-20 MED ORDER — POTASSIUM CHLORIDE 10 MEQ/100ML IV SOLN
10.0000 meq | INTRAVENOUS | Status: AC
  Administered 2012-07-20 (×3): 10 meq via INTRAVENOUS
  Filled 2012-07-20 (×3): qty 100

## 2012-07-20 NOTE — Progress Notes (Signed)
[facility] PROGRESS NOTE  Rhonda MICHAUX UJW:[id number] DOB: [birth year] DOA: 07/12/2012 PCP: [name]., NP   Patient initially admitted with diarrhea and fever.  Diarrhea has slowly improved during admission but not before patient went into a fib with RVR and AKI.  IVF have been continued and once Cr is better, patient should be able to go home   Assessment/Plan: Neutropenic fever  -hold broad-spectrum antibiotics including vancomycin and Zosyn as BX NGTD and all other culture negative (day #5)  -Followup blood and urine cultures- negative so far -Followup stool cultures. C. difficile PCR negative. Diarrhea improved- no abd pain- - D/C Neupogen   A fib with RVR - off cardizem gtt -metoprolol -ASA only for anticoagulation  Cards and heme onc - back in sinus on 3/9  Anorexia / Severe malnutrition in the context of acute illness / Obesity  -Patient requests something to stimulate her appetite. Low dose Remeron started.  -Dietician consult.  -Add Ensure.  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   HYPOTHYROIDISM  -Continue home dose of Synthroid.   DIABETES MELLITUS, TYPE II  -Check CBGs q. a.c. and cover with sliding scale insulin, sensitive scale. CBGs controlled -Continue Onglyza.   HYPERLIPIDEMIA  -Continue Lipitor.   DEPRESSION  -Continue Wellbutrin and Lexapro. Ativan as needed for breakthrough anxiety.   OBSTRUCTIVE SLEEP APNEA  -Continue CPAP each bedtime per home settings .  HYPERTENSION  -Continue Norvasc.   GERD  -Continue PPI therapy.   DVT, HX OF  ASA, off heparin gtt  Liver disease, chronic, with cirrhosis  -LFTs currently stable.   Cancer of breast  -Status post recent chemotherapy. Dr. [name] alerted of admission   Microcytic anemia / Thrombocytopenia  -Likely the sequela of recent chemotherapy.  Monitor blood counts closely. Monitor for signs of bleeding. Transfuse as needed for hemoglobin less than 7.      Code Status: full Family Communication: patient at bedside Disposition Plan:    Consultants:  cardiology  Procedures:  none  Antibiotics:    HPI/Subjective: Feeling well No c/o UOP good   Objective: Filed Vitals:   07/19/12 2135 07/20/12 0238 07/20/12 0500 07/20/12 0530  BP: 127/65 138/69  113/67  Pulse: 84 81  75  Temp: 98.4 F (36.9 C) 98.1 F (36.7 C)  99.3 F (37.4 C)  TempSrc: Oral Oral  Oral  Resp:  19  18  Height:      Weight:   88.3 kg (194 lb 10.7 oz)   SpO2: 94% 94%  96%    Intake/Output Summary (Last 24 hours) at 07/20/12 1138 Last data filed at 07/20/12 0900  Gross per 24 hour  Intake 2002.08 ml  Output      0 ml  Net 2002.08 ml   Filed Weights   07/13/12 2340 07/19/12 0500 07/20/12 0500  Weight: 86.8 kg (191 lb 5.8 oz) 87 kg (191 lb 12.8 oz) 88.3 kg (194 lb 10.7 oz)    Exam:   General:  A+Ox3, NAD  Cardiovascular: regular  Respiratory: clear anterior  Abdomen: +BS, soft  NT  Musculoskeletal: moves all 4 ext    Data Reviewed: Basic Metabolic Panel:  Recent Labs Lab 07/14/12 1100  07/16/12 0500 07/17/12 0556 07/18/12 0443 07/19/12 0501 07/20/12 0410  NA  --   < > 136 136 137 137 138  K  --   < > 3.9 3.4* 3.8 3.2* 2.9*  CL  --   < > 108 107 108 109 109  CO2  --   < > 19 20 19  17* 17*  GLUCOSE  --   < > 113* 120* 114* 101* 163*  BUN  --   < > 7 6 7 8 7   CREATININE  --   < > 1.62* 1.70* 1.90* 1.83* 1.71*  CALCIUM  --   < > 7.4* 7.3* 7.4* 7.0* 7.2*  MG 2.0  --   --   --   --   --   --   PHOS  --   --   --   --  3.1 3.1 2.3  < > = values in this interval not displayed. Liver Function Tests:  Recent Labs Lab 07/18/12 0443 07/19/12 0501 07/20/12 0410  ALBUMIN 2.3* 2.2* 2.1*   No results found for this basename: LIPASE, AMYLASE,  in the last 168 hours No results found for this basename: AMMONIA,  in  the last 168 hours CBC:  Recent Labs Lab 07/16/12 0500 07/17/12 0556 07/18/12 0443 07/19/12 0501 07/20/12 0410  WBC 8.6 8.8 9.4 8.8 7.6  NEUTROABS 6.5 6.6 7.3 7.1 6.4  HGB 10.6* 10.4* 9.8* 9.3* 9.0*  HCT 32.6* 32.2* 30.9* 28.9* 27.6*  MCV 77.4* 77.8* 78.8 78.3 78.0  PLT 135* 129* 123* 120* 114*   Cardiac Enzymes:  Recent Labs Lab 07/13/12 2353 07/14/12 0519 07/14/12 1225  TROPONINI <0.30 <0.30 <0.30   BNP (last 3 results) No results found for this basename: PROBNP,  in the last 8760 hours CBG:  Recent Labs Lab 07/19/12 0743 07/19/12 1148 07/19/12 1618 07/19/12 2148 07/20/12 0756  GLUCAP 100* 141* 141* 160* 103*    Recent Results (from the past 240 hour(s))  CULTURE, BLOOD (ROUTINE X 2)     Status: None   Collection Time    07/12/12 11:45 AM      Result Value Range Status   Specimen Description BLOOD LEFT FOREARM   Final   Special Requests BOTTLES DRAWN AEROBIC AND ANAEROBIC   Final   Culture  Setup Time 07/12/2012 15:29   Final   Culture NO GROWTH 5 DAYS   Final   Report Status 07/18/2012 FINAL   Final  CULTURE, BLOOD (ROUTINE X 2)     Status: None   Collection Time    07/12/12 11:51 AM      Result Value Range Status   Specimen Description BLOOD EFT HAND   Final   Special Requests BOTTLES DRAWN AEROBIC AND ANAEROBIC   Final   Culture  Setup Time 07/12/2012 15:29   Final   Culture NO GROWTH 5 DAYS   Final   Report Status 07/18/2012 FINAL   Final  CLOSTRIDIUM DIFFICILE BY PCR     Status: None   Collection Time    07/12/12  6:30 PM      Result Value Range Status   C difficile by pcr NEGATIVE  NEGATIVE Final  STOOL CULTURE     Status: None   Collection Time    07/12/12  6:33 PM      Result Value Range Status   Specimen Description STOOL   Final   Special Requests Immunocompromised   Final   Culture     Final   Value: NO SALMONELLA, SHIGELLA, CAMPYLOBACTER, YERSINIA, OR E.COLI 0157:H7 ISOLATED   Report Status 07/16/2012 FINAL   Final  URINE  CULTURE     Status: None   Collection Time  07/14/12  8:55 PM      Result Value Range Status   Specimen Description URINE, CLEAN CATCH   Final   Special Requests Immunocompromised   Final   Culture  Setup Time 07/15/2012 02:42   Final   Colony Count NO GROWTH   Final   Culture NO GROWTH   Final   Report Status 07/16/2012 FINAL   Final     Studies: No results found.  Scheduled Meds: . aspirin EC  81 mg Oral Daily  . buPROPion  150 mg Oral q morning - 10a  . diltiazem  120 mg Oral Daily  . diphenoxylate-atropine  1 tablet Oral QID  . escitalopram  10 mg Oral QHS  . feeding supplement  1 Container Oral BID BM  . insulin aspart  0-9 Units Subcutaneous TID WC  . levothyroxine  125 mcg Oral QAC breakfast  . linagliptin  5 mg Oral Daily  . magic mouthwash  5 mL Oral QID  . metoprolol succinate  75 mg Oral BID  . mirtazapine  7.5 mg Oral QHS  . pantoprazole  40 mg Oral Daily  . potassium chloride  10 mEq Intravenous Q1 Hr x 3  . potassium chloride  20 mEq Oral TID  . saccharomyces boulardii  250 mg Oral BID   Continuous Infusions: . sodium chloride 125 mL/hr at 07/20/12 7829    Principal Problem:   Neutropenic fever Active Problems:   HYPOTHYROIDISM   DIABETES MELLITUS, TYPE II   HYPERLIPIDEMIA   DEPRESSION   OBSTRUCTIVE SLEEP APNEA   HYPERTENSION   GERD   DVT, HX OF   Liver disease, chronic, with cirrhosis   Cancer of breast   Microcytic anemia   Thrombocytopenia   Hypokalemia   Hyponatremia   Obesity, unspecified   Severe malnutrition in the context of acute illness   A-fib    Time spent: 25    [facility], JESSICA  [facility] Pager [phone]. If 7PM-7AM, please contact night-coverage at [email or web address], password [facility] 07/20/2012, 11:38 AM  LOS: 8 days

## 2012-07-20 NOTE — Progress Notes (Signed)
RT turned CPAP on for patient.  Patient stated that she would put the mask on in a little while.  Patient is using her home nasal mask and our machine.  The CPAP is set at 11 cmH20

## 2012-07-20 NOTE — Progress Notes (Signed)
NUTRITION FOLLOW UP  Intervention:   Discontinue Resource Breeze Add Beneprotein TID Encourage po intake  Nutrition Dx:   Inadequate oral intake related to poor appetite, dry mouth as evidenced by <50% meal intake; ongoing but dry mouth resolved  Goal:   Pt to meet >/= 90% of their estimated nutrition needs; not met Diarrhea resolved; not met  Monitor:   Wt; 4 lb wt gain since 3/3 Po intake; poor Bowel function; diarrhea continues  Assessment:   3/4: Pt with recently diagnosed breast CA s/p right mastectomy and 1 cycle of chemotherapy. Pt developed diarrhea 3 days ago, 20-30 stools/day. Pt went from 201 pounds to 190 pounds in the past week. Pt reports poor appetite in the past week with pt only consuming bites of food at mealtimes and drinking 16 ounce bottle of Gatorade/day. Pt states before then she had a good appetite and was eating 3 meals/day. Pt reports the diarrhea has improved today with only 3 episodes so far. Pt didn't like the taste of the breakfast potatoes and bagel. Pt c/o dry mouth that started today. Pt does not like Ensure but willing to try [employer].  3/11: Pt states she continues to have no appetite and has not been eating anything, not even bites of food, for the past week. Pt states that she has been drinking 64 ounces of fluid daily; water, Pepsi, Gatorade, and grape drink. Pt does not like [employer]. Pt states that she no longer has dry mouth but, when she would try to eat it feels like "food just doesn't want to go down". Pt states that everything has a metallic taste; discussed tips for taste changes. Emphasized the importance of getting adequate calories & protein, and maintaining weight. Described supplement options but, pt refused all. Pt is not interested in trying to eat at this time. Pt requested a popsicle and was given two which she seemed very pleased about. Pt agreed to drink more 100% juice instead of just sugary beverages in order to get more  nutrients. Will send Beneprotein to add to juice.   Height: Ht Readings from Last 1 Encounters:  07/13/12 5\' 4"  (1.626 m)    Weight Status:   Wt Readings from Last 1 Encounters:  07/20/12 194 lb 10.7 oz (88.3 kg)    Re-estimated needs:  Kcal: 1450-1750  Protein: 55-65g  Fluid: 1.4-1.7L/day  Skin: dry with rash  Diet Order: General   Intake/Output Summary (Last 24 hours) at 07/20/12 1311 Last data filed at 07/20/12 0900  Gross per 24 hour  Intake 1882.08 ml  Output      0 ml  Net 1882.08 ml    Last BM: 3/10 diarrhea   Labs:   Recent Labs Lab 07/14/12 1100  07/18/12 0443 07/19/12 0501 07/20/12 0410  NA  --   < > 137 137 138  K  --   < > 3.8 3.2* 2.9*  CL  --   < > 108 109 109  CO2  --   < > 19 17* 17*  BUN  --   < > 7 8 7   CREATININE  --   < > 1.90* 1.83* 1.71*  CALCIUM  --   < > 7.4* 7.0* 7.2*  MG 2.0  --   --   --  1.7  PHOS  --   --  3.1 3.1 2.3  GLUCOSE  --   < > 114* 101* 163*  < > = values in this interval not displayed.  CBG (last 3)   Recent Labs  07/19/12 2148 07/20/12 0756 07/20/12 1157  GLUCAP 160* 103* 91    Scheduled Meds: . aspirin EC  81 mg Oral Daily  . buPROPion  150 mg Oral q morning - 10a  . diltiazem  120 mg Oral Daily  . diphenoxylate-atropine  1 tablet Oral QID  . escitalopram  10 mg Oral QHS  . feeding supplement  1 Container Oral BID BM  . insulin aspart  0-9 Units Subcutaneous TID WC  . levothyroxine  125 mcg Oral QAC breakfast  . linagliptin  5 mg Oral Daily  . magic mouthwash  5 mL Oral QID  . metoprolol succinate  75 mg Oral BID  . mirtazapine  7.5 mg Oral QHS  . pantoprazole  40 mg Oral Daily  . potassium chloride  10 mEq Intravenous Q1 Hr x 3  . potassium chloride  20 mEq Oral TID  . saccharomyces boulardii  250 mg Oral BID    Continuous Infusions: . sodium chloride 125 mL/hr (07/20/12 1237)    Ian Malkin RD, LDN Inpatient Clinical Dietitian Pager: [phone] After Hours Pager: [phone]

## 2012-07-20 NOTE — Progress Notes (Addendum)
Subjective: Appetite much better  Objective Vital signs in last 24 hours: Filed Vitals:   07/20/12 0238 07/20/12 0500 07/20/12 0530 07/20/12 1400  BP: 138/69  113/67 117/69  Pulse: 81  75 76  Temp: 98.1 F (36.7 C)  99.3 F (37.4 C) 98.4 F (36.9 C)  TempSrc: Oral  Oral Oral  Resp: 19  18 18   Height:      Weight:  88.3 kg (194 lb 10.7 oz)    SpO2: 94%  96% 95%   Weight change: 1.3 kg (2 lb 13.9 oz)  Intake/Output Summary (Last 24 hours) at 07/20/12 1753 Last data filed at 07/20/12 1300  Gross per 24 hour  Intake 2122.08 ml  Output      0 ml  Net 2122.08 ml   Labs: Basic Metabolic Panel:  Recent Labs Lab 07/17/12 0556 07/18/12 0443 07/19/12 0501 07/20/12 0410  NA 136 137 137 138  K 3.4* 3.8 3.2* 2.9*  CL 107 108 109 109  CO2 20 19 17* 17*  GLUCOSE 120* 114* 101* 163*  BUN 6 7 8 7   CREATININE 1.70* 1.90* 1.83* 1.71*  CALCIUM 7.3* 7.4* 7.0* 7.2*  PHOS  --  3.1 3.1 2.3   Liver Function Tests:  Recent Labs Lab 07/18/12 0443 07/19/12 0501 07/20/12 0410  ALBUMIN 2.3* 2.2* 2.1*   No results found for this basename: LIPASE, AMYLASE,  in the last 168 hours No results found for this basename: AMMONIA,  in the last 168 hours CBC:  Recent Labs Lab 07/17/12 0556 07/18/12 0443 07/19/12 0501 07/20/12 0410  WBC 8.8 9.4 8.8 7.6  NEUTROABS 6.6 7.3 7.1 6.4  HGB 10.4* 9.8* 9.3* 9.0*  HCT 32.2* 30.9* 28.9* 27.6*  MCV 77.8* 78.8 78.3 78.0  PLT 129* 123* 120* 114*   PT/INR: @LABRCNTIP (inr:5)   Scheduled Meds ) . aspirin EC  81 mg Oral Daily  . buPROPion  150 mg Oral q morning - 10a  . diltiazem  120 mg Oral Daily  . diphenoxylate-atropine  1 tablet Oral QID  . escitalopram  10 mg Oral QHS  . insulin aspart  0-9 Units Subcutaneous TID WC  . levothyroxine  125 mcg Oral QAC breakfast  . linagliptin  5 mg Oral Daily  . magic mouthwash  5 mL Oral QID  . metoprolol succinate  75 mg Oral BID  . mirtazapine  7.5 mg Oral QHS  . pantoprazole  40 mg Oral Daily   . potassium chloride  20 mEq Oral TID  . protein supplement  1 scoop Oral TID WC  . saccharomyces boulardii  250 mg Oral BID    Physical Exam:  Blood pressure 117/69, pulse 76, temperature 98.4 F (36.9 C), temperature source Oral, resp. rate 18, height 5\' 4"  (1.626 m), weight 88.3 kg (194 lb 10.7 oz), SpO2 95.00%.  Gen:WD WN WF in NAD  CVS:IRR IRR no rub  Resp:CTA  Abd:+bs, soft, NT/ND  ZOX:WRUEA pretib edema  Assessment/Plan:  1. AKI: suspect ATN from vol depletion/UTI/carboplatin. Baseline creatinine was 0.5-0.7 from Feb '14. Creat down to 1.7. Fluid, replete, will stop saline IVF"s. If creatinine no worse tomorrow, she can be discharged to f/u in outpatient setting. Would recommend labs with PCP within a week of discharge.  2. Hyponatremia- improved 3. Neutropenic fever- better 4. Diarrhea- C Diff PCR neg 5. Thrombocytopenia- follow/stable 6. Anemia of malignancy/chemo (s/p 1st carbo/taxol chemo) 7. A fib- per primary 8. Breast cancer   Vinson Moselle  MD [phone] pgr    [phone]  [phone] cell 07/20/2012, 5:53 PM

## 2012-07-20 NOTE — Progress Notes (Signed)
    Subjective:  2 episodes of diarrhea overnight. No chest pain, dyspnea, or palpitations.  Objective:  Vital Signs in the last 24 hours: Temp:  [98.1 F (36.7 C)-100 F (37.8 C)] 99.3 F (37.4 C) (03/11 0530) Pulse Rate:  [75-84] 75 (03/11 0530) Resp:  [18-19] 18 (03/11 0530) BP: (113-141)/(48-69) 113/67 mmHg (03/11 0530) SpO2:  [94 %-97 %] 96 % (03/11 0530) Weight:  [88.3 kg (194 lb 10.7 oz)] 88.3 kg (194 lb 10.7 oz) (03/11 0500)  Intake/Output from previous day: 03/10 0701 - 03/11 0700 In: 3154.6 [P.O.:600; I.V.:2554.6] Out: 250 [Urine:250]  Physical Exam: Pt is alert and oriented, NAD HEENT: normal Neck: JVP - normal, carotids 2+= without bruits Lungs: CTA bilaterally CV: RRR without murmur or gallop Abd: soft, NT, Positive BS, obese Ext: no C/C/E, distal pulses intact and equal Skin: warm/dry no rash   Lab Results:  Recent Labs  07/19/12 0501 07/20/12 0410  WBC 8.8 7.6  HGB 9.3* 9.0*  PLT 120* 114*    Recent Labs  07/19/12 0501 07/20/12 0410  NA 137 138  K 3.2* 2.9*  CL 109 109  CO2 17* 17*  GLUCOSE 101* 163*  BUN 8 7  CREATININE 1.83* 1.71*   No results found for this basename: TROPONINI, CK, MB,  in the last 72 hours  Tele: Sinus rhythm with PACs.  Assessment/Plan:  57 year old woman with atrial fibrillation in the setting of neutropenic fever. She is maintaining sinus rhythm. I have reviewed all the notes from yesterday. Considering the fact she is maintaining sinus rhythm, I agree with Dr. [name] that it is most prudent to keep her on only an aspirin rather than for anticoagulation. She will continue with chemotherapy and will have risk of cytopenias. I will stop her Pradaxa and start her on ASA 81 mg daily. Otherwise continue current medications to include diltiazem CD 120 mg daily.  Tonny Bollman, M.D. 07/20/2012, 6:20 AM

## 2012-07-21 ENCOUNTER — Telehealth: Payer: Self-pay | Admitting: Family

## 2012-07-21 ENCOUNTER — Ambulatory Visit: Payer: 59

## 2012-07-21 DIAGNOSIS — J309 Allergic rhinitis, unspecified: Secondary | ICD-10-CM

## 2012-07-21 LAB — RENAL FUNCTION PANEL
CO2: 19 mEq/L (ref 19–32)
Calcium: 7.3 mg/dL — ABNORMAL LOW (ref 8.4–10.5)
Creatinine, Ser: 1.61 mg/dL — ABNORMAL HIGH (ref 0.50–1.10)
GFR calc non Af Amer: 34 mL/min — ABNORMAL LOW (ref >90)
Glucose, Bld: 91 mg/dL (ref 70–99)

## 2012-07-21 LAB — CBC WITH DIFFERENTIAL/PLATELET
Basophils Absolute: 0 10*3/uL (ref 0.0–0.1)
Eosinophils Absolute: 0 10*3/uL (ref 0.0–0.7)
Eosinophils Relative: 0 % (ref 0–5)
HCT: 27.7 % — ABNORMAL LOW (ref 36.0–46.0)
Lymphocytes Relative: 15 % (ref 12–46)
MCH: 25.3 pg — ABNORMAL LOW (ref 26.0–34.0)
MCV: 78.7 fL (ref 78.0–100.0)
Monocytes Absolute: 0.4 10*3/uL (ref 0.1–1.0)
RDW: 17.3 % — ABNORMAL HIGH (ref 11.5–15.5)
WBC: 6.4 10*3/uL (ref 4.0–10.5)

## 2012-07-21 LAB — GLUCOSE, CAPILLARY: Glucose-Capillary: 95 mg/dL (ref 70–99)

## 2012-07-21 MED ORDER — METOPROLOL SUCCINATE ER 25 MG PO TB24: 75.0000 mg | ORAL_TABLET | Freq: Every day | ORAL | Status: DC

## 2012-07-21 MED ORDER — HEPARIN SOD (PORK) LOCK FLUSH 100 UNIT/ML IV SOLN
500.0000 [IU] | INTRAVENOUS | Status: DC | PRN
  Administered 2012-07-21: 500 [IU]
  Filled 2012-07-21: qty 5

## 2012-07-21 MED ORDER — HEPARIN SOD (PORK) LOCK FLUSH 100 UNIT/ML IV SOLN
500.0000 [IU] | INTRAVENOUS | Status: DC
  Filled 2012-07-21: qty 5

## 2012-07-21 MED ORDER — RESOURCE INSTANT PROTEIN PO PWD PACKET: 1.0000 | Freq: Three times a day (TID) | ORAL | Status: DC

## 2012-07-21 MED ORDER — DILTIAZEM HCL ER COATED BEADS 120 MG PO CP24: 120.0000 mg | ORAL_CAPSULE | Freq: Every day | ORAL | Status: DC

## 2012-07-21 MED ORDER — SACCHAROMYCES BOULARDII 250 MG PO CAPS: 250.0000 mg | ORAL_CAPSULE | Freq: Two times a day (BID) | ORAL | Status: DC

## 2012-07-21 MED ORDER — MAGIC MOUTHWASH: 5.0000 mL | Freq: Four times a day (QID) | ORAL | Status: DC

## 2012-07-21 MED ORDER — OXYCODONE HCL 5 MG PO TABS: 5.0000 mg | ORAL_TABLET | ORAL | Status: DC | PRN

## 2012-07-21 NOTE — Progress Notes (Signed)
Patient wants to know when she will be discharged today.

## 2012-07-21 NOTE — Telephone Encounter (Signed)
Pls call pt to arrange a 2 week post hospital follow up. 

## 2012-07-21 NOTE — Discharge Summary (Signed)
[facility]                                                                                   [name], is a 57 y.o. female  DOB 1956-02-15  MRN [id number].  Admission date:  07/12/2012  Discharge Date:  07/21/2012  Primary MD  [name]., NP  Admitting Physician  Maryruth Bun Rama, MD  Admission Diagnosis  Diarrhea [787.91] Dehydration [276.51] Hyponatremia [276.1] Neutropenic fever [288.00, 780.61] Pancytopenia [284.19] Cancer of breast, right [174.9]  Discharge Diagnosis     Principal Problem:   Neutropenic fever Active Problems:   HYPOTHYROIDISM   DIABETES MELLITUS, TYPE II   HYPERLIPIDEMIA   DEPRESSION   OBSTRUCTIVE SLEEP APNEA   HYPERTENSION   GERD   DVT, HX OF   Liver disease, chronic, with cirrhosis   Cancer of breast   Microcytic anemia   Thrombocytopenia   Hypokalemia   Hyponatremia   Obesity, unspecified   Severe malnutrition in the context of acute illness   A-fib    Past Medical History  Diagnosis Date  . Depression   . DVT (deep venous thrombosis)     hx of on HRT left leg ~2006  . GERD (gastroesophageal reflux disease)   . Hyperlipidemia   . Hypertension   . Hypothyroidism   . PPD positive, treated     rx inh   . OSA on CPAP   . Diabetes mellitus   . 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 Breast cancer     a. 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 Tubal ligation    . Cholecystectomy    . Foot surgery    . Eye surgery    . Cataract extraction    . Percutaneous liver biopsy    . Breast biopsy      left breast  . Mastectomy modified radical  06/08/2012    Procedure: MASTECTOMY MODIFIED RADICAL;  Surgeon: Mariella Saa, MD;  Location: [facility] OR;  Service: General;  Laterality: Right;  . Portacath placement  06/08/2012     Procedure: INSERTION PORT-A-CATH;  Surgeon: Mariella Saa, MD;  Location: [facility] OR;  Service: General;  Laterality: Left;     Recommendations for primary care physician for things to follow:   Follow CBC and BMP closely, please ensure patient follows with pillow recommended physicians.   Discharge Diagnoses:   Principal Problem:   Neutropenic fever Active Problems:   HYPOTHYROIDISM   DIABETES MELLITUS, TYPE II   HYPERLIPIDEMIA   DEPRESSION   OBSTRUCTIVE SLEEP APNEA   HYPERTENSION   GERD   DVT, HX OF   Liver disease, chronic, with cirrhosis   Cancer of breast   Microcytic anemia   Thrombocytopenia   Hypokalemia   Hyponatremia   Obesity, unspecified   Severe malnutrition in the context of acute illness   A-fib    Discharge Condition: Stable   Diet recommendation: See Discharge Instructions below   Consults GI,Renal,Oncology,Cardiology    History  of present illness and  Hospital Course:     Kindly see H&P for history of present illness and admission details, please review complete Labs, Consult reports and Test reports for all details in brief [name], is a 57 y.o. female, patient was admitted for neutropenic fever in the setting of diarrhea, all cultures negative now off of antibiotics and stable, she also developed atrial fibrillation with RVR and has now converted to sinus, also dehydration induced acute renal insufficiency with creatinine now improving nicely. Patient was seen here by oncology, cardiology, GI along with nephrology. She underwent renal ultrasound which was stable, all her blood in stool cultures are negative, echogram was unremarkable. She has been placed on combination of beta blocker and calcium channel blocker by cardiology along with 81 mg of aspirin, she is a poor candidate for oral anticoagulation due to her underlying malignancy.   The patient has underlying history of breast cancer and undergoing chemotherapy under the care of Dr.  [name] a she will continue to follow with him under close basis.   For type 2 diabetes mellitus she will continue using her home medications with close outpatient followup with her PCP.   Patient also has history of dyslipidemia, depression, obstructive sleep apnea on nighttime CPAP, hypothyroidism, GERD. Home medications will be continued without change.   She has hypertension her Norvasc has been stopped as she has been started on a combination of Cardizem and Toprol for atrial fibrillation.   In terms of her atrial fibrillation with RVR, she is currently in sinus, she will continue her Cardizem and Toprol combination started by cardiology, she will continue to follow with cardiologist on a close basis, poor anticoagulation candidate due to her underlying malignancy per cardiology and her oncologist, continue 81 mg aspirin.   For [facility] which appears to be mild at this time protein supplements outpatient will be continued.   Incidental finding of liver cirrhosis on CT scan. Recommend outpatient followup with GI in one to 2 weeks.    Today   Subjective:   [name] today has no headache,no chest abdominal pain,no new weakness tingling or numbness, feels much better wants to go home today.   Objective:   Blood pressure 125/60, pulse 69, temperature 99 F (37.2 C), temperature source Oral, resp. rate 18, height 5\' 4"  (1.626 m), weight 88.7 kg (195 lb 8.8 oz), SpO2 95.00%.   Intake/Output Summary (Last 24 hours) at 07/21/12 0848 Last data filed at 07/21/12 0516  Gross per 24 hour  Intake   1100 ml  Output   2350 ml  Net  -1250 ml    Exam Awake Alert, Oriented *3, No new F.N deficits, Normal affect [place].AT,PERRAL Supple Neck,No JVD, No cervical lymphadenopathy appriciated.  Symmetrical Chest wall movement, Good air movement bilaterally, CTAB RRR,No Gallops,Rubs or new Murmurs, No Parasternal Heave +ve B.Sounds, Abd Soft, Non tender, No organomegaly appriciated, No rebound  -guarding or rigidity. No Cyanosis, Clubbing or edema, No new Rash or bruise  Data Review   Major procedures and Radiology Reports - PLEASE review detailed and final reports for all details in brief -       Dg Chest 2 View  07/12/2012  *RADIOLOGY REPORT*  Clinical Data: Fever.  Breast cancer  CHEST - 2 VIEW  Comparison: 06/08/2012  Findings: Lungs are clear without evidence of pneumonia.  No heart failure or effusion.  Negative for mass lesion.  Port-A-Cath tip in the SVC.  IMPRESSION: No acute abnormality.   Original  Report Authenticated By: [name], M.D.    US Renal  07/16/2012  *RADIOLOGY REPORT*  Clinical Data: Renal failure  RENAL/URINARY TRACT ULTRASOUND COMPLETE  Comparison:  None.  Findings:  Right Kidney:  Measures 14.6 cm.  No mass or hydronephrosis.  Left Kidney:  Measures 14.2 cm.  No mass or hydronephrosis.  Bladder:  Within normal limits.  Additional comments: Pelvic ascites.  IMPRESSION: Negative renal ultrasound.   Original Report Authenticated By: [name], M.D.    Nm Pet Image Initial (pi) Skull Base To Thigh  07/05/2012  *RADIOLOGY REPORT*  Clinical Data: Initial treatment strategy for right breast carcinoma.  NUCLEAR MEDICINE PET SKULL BASE TO THIGH  Fasting Blood Glucose:  100  Technique:  18.8 mCi F-18 FDG was injected intravenously. CT data was obtained and used for attenuation correction and anatomic localization only.  (This was not acquired as a diagnostic CT examination.) Additional exam technical data entered on technologist worksheet.  Comparison:  None  Findings:  Neck: No hypermetabolic lymph nodes in the neck.  Chest:  No hypermetabolic mediastinal or hilar nodes.  No hypermetabolic axillary lymph nodes identified.  Muscular activity noted in left shoulder girdle.  No suspicious pulmonary nodules on the CT scan.  Abdomen/Pelvis:  No abnormal hypermetabolic activity within the liver, pancreas, adrenal glands, or spleen.  No hypermetabolic lymph nodes in the  abdomen or pelvis.  Incidental findings noted on noncontrast CT or hepatic cirrhosis and mild splenomegaly, consistent with portal venous hypertension.  No evidence of ascites.  Several small uterine fibroids are also noted, at least one showing metabolic activity.  Skeleton:  No focal hypermetabolic activity to suggest skeletal metastasis.  IMPRESSION:  1.  No metabolically active malignancy identified. 2.  Incidental findings include hepatic cirrhosis, mild splenomegaly, and small uterine fibroids.   Original Report Authenticated By: Myles Rosenthal, M.D.    Echo  - The patient was in atrial fibrillation. Normal LV size with EF 60-65%. Normal RV size and systolic function. No significant valvular abnormalities.      Micro Results      Recent Results (from the past 240 hour(s))  CULTURE, BLOOD (ROUTINE X 2)     Status: None   Collection Time    07/12/12 11:45 AM      Result Value Range Status   Specimen Description BLOOD LEFT FOREARM   Final   Special Requests BOTTLES DRAWN AEROBIC AND ANAEROBIC   Final   Culture  Setup Time 07/12/2012 15:29   Final   Culture NO GROWTH 5 DAYS   Final   Report Status 07/18/2012 FINAL   Final  CULTURE, BLOOD (ROUTINE X 2)     Status: None   Collection Time    07/12/12 11:51 AM      Result Value Range Status   Specimen Description BLOOD EFT HAND   Final   Special Requests BOTTLES DRAWN AEROBIC AND ANAEROBIC   Final   Culture  Setup Time 07/12/2012 15:29   Final   Culture NO GROWTH 5 DAYS   Final   Report Status 07/18/2012 FINAL   Final  CLOSTRIDIUM DIFFICILE BY PCR     Status: None   Collection Time    07/12/12  6:30 PM      Result Value Range Status   C difficile by pcr NEGATIVE  NEGATIVE Final  STOOL CULTURE     Status: None   Collection Time    07/12/12  6:33 PM      Result Value  Range Status   Specimen Description STOOL   Final   Special Requests Immunocompromised   Final   Culture     Final   Value: NO SALMONELLA, SHIGELLA,  CAMPYLOBACTER, YERSINIA, OR E.COLI 0157:H7 ISOLATED   Report Status 07/16/2012 FINAL   Final  URINE CULTURE     Status: None   Collection Time    07/14/12  8:55 PM      Result Value Range Status   Specimen Description URINE, CLEAN CATCH   Final   Special Requests Immunocompromised   Final   Culture  Setup Time 07/15/2012 02:42   Final   Colony Count NO GROWTH   Final   Culture NO GROWTH   Final   Report Status 07/16/2012 FINAL   Final     CBC w Diff: Lab Results  Component Value Date   WBC 6.4 07/21/2012   WBC 5.7 07/06/2012   HGB 8.9* 07/21/2012   HGB 11.3* 07/06/2012   HCT 27.7* 07/21/2012   HCT 35.4 07/06/2012   PLT 131* 07/21/2012   PLT 153 07/06/2012   LYMPHOPCT 15 07/21/2012   LYMPHOPCT 14.0 07/06/2012   BANDSPCT 0 07/12/2012   MONOPCT 6 07/21/2012   MONOPCT 6.7 07/06/2012   EOSPCT 0 07/21/2012   EOSPCT 0.0 07/06/2012   BASOPCT 0 07/21/2012   BASOPCT 0.2 07/06/2012    CMP: Lab Results  Component Value Date   NA 142 07/21/2012   NA 139 07/06/2012   K 3.5 07/21/2012   K 3.8 07/06/2012   CL 113* 07/21/2012   CL 108* 07/06/2012   CO2 19 07/21/2012   CO2 21* 07/06/2012   BUN 7 07/21/2012   BUN 8.5 07/06/2012   CREATININE 1.61* 07/21/2012   CREATININE 0.7 07/06/2012   CREATININE 0.62 05/03/2012   PROT 5.3* 07/13/2012   PROT 7.0 07/06/2012   ALBUMIN 2.0* 07/21/2012   ALBUMIN 3.2* 07/06/2012   BILITOT 0.6 07/13/2012   BILITOT 0.46 07/06/2012   ALKPHOS 111 07/13/2012   ALKPHOS 184* 07/06/2012   AST 20 07/13/2012   AST 32 07/06/2012   ALT 36* 07/13/2012   ALT 42 07/06/2012  .   Discharge Instructions     Follow with Primary MD [name]., NP in 2-3 days   Get CBC, CMP, checked 2-3 days by Primary MD and again as instructed by your Primary MD. Get a 2 view Chest X ray done next visit.  Get Medicines reviewed and adjusted.  Please request your Prim.MD to go over all Hospital Tests and Procedure/Radiological results at the follow up, please get all Hospital records sent to your Prim MD  by signing hospital release before you go home.  Activity: As tolerated with Full fall precautions use walker/cane & assistance as needed   Diet:  Heart Healthy - Low carb  For Heart failure patients - Check your Weight same time everyday, if you gain over 2 pounds, or you develop in leg swelling, experience more shortness of breath or chest pain, call your Primary MD immediately. Follow Cardiac Low Salt Diet and 1.8 lit/day fluid restriction.  Disposition Home  If you experience worsening of your admission symptoms, develop shortness of breath, life threatening emergency, suicidal or homicidal thoughts you must seek medical attention immediately by calling 911 or calling your MD immediately  if symptoms less severe.  You Must read complete instructions/literature along with all the possible adverse reactions/side effects for all the Medicines you take and that have been prescribed to you. Take any new Medicines  after you have completely understood and accpet all the possible adverse reactions/side effects.   Do not drive and provide baby sitting services if your were admitted for syncope or siezures until you have seen by Primary MD or a Neurologist and advised to do so again.  Do not drive when taking Pain medications.    Do not take more than prescribed Pain, Sleep and Anxiety Medications  Special Instructions: If you have smoked or chewed Tobacco  in the last 2 yrs please stop smoking, stop any regular Alcohol  and or any Recreational drug use.  Wear Seat belts while driving.      Follow-up Information   Follow up with [name]., NP. Schedule an appointment as soon as possible for a visit in 2 days.   Contact information:   [address] [place] [address] [phone]       Follow up with Lowella Dell, MD. Schedule an appointment as soon as possible for a visit in 1 week.   Contact information:   [address] [place]  [address] [phone]       Follow up with [facility], MD. Schedule an appointment as soon as possible for a visit in 1 week.   Contact information:   [address] [facility] [place] [address] [phone]       Follow up with Donato Schultz, MD. Schedule an appointment as soon as possible for a visit in 1 week.   Contact information:   [address] [place] [address] [phone]       Follow up with Stan Head, MD. Schedule an appointment as soon as possible for a visit in 1 week. (For cirrhosis)    Contact information:   [address] [place] [address] [phone]         Discharge Medications     Medication List    STOP taking these medications       amLODipine 5 MG tablet  Commonly known as:  NORVASC      TAKE these medications       ALPRAZolam 0.25 MG tablet  Commonly known as:  XANAX  Take 1 tablet (0.25 mg total) by mouth 3 (three) times daily as needed.     aspirin EC 81 MG tablet  Take 81 mg by mouth every morning.     atorvastatin 10 MG tablet  Commonly known as:  LIPITOR  Take 10 mg by mouth at bedtime.     buPROPion 150 MG 12 hr tablet  Commonly known as:  WELLBUTRIN SR  Take 150 mg by mouth every morning.     dexamethasone 4 MG tablet  Commonly known as:  DECADRON  Take 4 mg by mouth. 2 tabs PO BID on day before and 2 days after chemo. 2 tabs PO in am only on day 4.     diltiazem 120 MG 24 hr capsule  Commonly known as:  CARDIZEM CD  Take 1 capsule (120 mg total) by mouth daily.     escitalopram 10 MG tablet  Commonly known as:  LEXAPRO  Take 10 mg by mouth at bedtime.     glucose blood test strip  Commonly known as:  TRUETEST TEST  Use as instructed     levothyroxine 125 MCG tablet  Commonly known as:  SYNTHROID, LEVOTHROID  Take 125 mcg by mouth daily before breakfast.     lidocaine-prilocaine cream  Commonly known as:  EMLA  Apply topically  as needed.     LORazepam 0.5 MG tablet   Commonly known as:  ATIVAN  1 tab up to twice daily PRN anxiety, insomnia and/or nausea     magic mouthwash Soln  Take 5 mLs by mouth 4 (four) times daily.     metoprolol succinate 25 MG 24 hr tablet  Commonly known as:  TOPROL XL  Take 3 tablets (75 mg total) by mouth daily. Take with or immediately following a meal.     omeprazole 20 MG capsule  Commonly known as:  PRILOSEC  Take 1 capsule (20 mg total) by mouth 2 (two) times daily.     oxyCODONE 5 MG immediate release tablet  Commonly known as:  Oxy IR/ROXICODONE  Take 1 tablet (5 mg total) by mouth every 4 (four) hours as needed.     prochlorperazine 10 MG tablet  Commonly known as:  COMPAZINE  1 tab PO ACHS x 3 days after chemo, then 1 tab PO q 6 hr PRN nausea     promethazine 12.5 MG tablet  Commonly known as:  PHENERGAN  Take 1 tablet (12.5 mg total) by mouth every 4 (four) hours as needed for nausea.     protein supplement 6 g Powd  Commonly known as:  RESOURCE BENEPROTEIN  Take 1 scoop (6 g total) by mouth 3 (three) times daily with meals.     saccharomyces boulardii 250 MG capsule  Commonly known as:  FLORASTOR  Take 1 capsule (250 mg total) by mouth 2 (two) times daily.     saxagliptin HCl 5 MG Tabs tablet  Commonly known as:  ONGLYZA  Take 5 mg by mouth every morning.           Total Time in preparing paper work, data evaluation and todays exam - 35 minutes  Leroy Sea M.D on 07/21/2012 at 8:48 AM  [facility] Office  [phone]

## 2012-07-21 NOTE — Progress Notes (Signed)
Called IV team to de-access port.

## 2012-07-21 NOTE — Telephone Encounter (Signed)
Patient scheduled an appointment for 07/23/12

## 2012-07-23 ENCOUNTER — Ambulatory Visit (INDEPENDENT_AMBULATORY_CARE_PROVIDER_SITE_OTHER): Payer: 59 | Admitting: Family

## 2012-07-23 ENCOUNTER — Encounter: Payer: Self-pay | Admitting: Family

## 2012-07-23 VITALS — BP 140/70 | HR 77 | Temp 98.0°F | Resp 18 | Ht 64.0 in | Wt 207.1 lb

## 2012-07-23 DIAGNOSIS — N289 Disorder of kidney and ureter, unspecified: Secondary | ICD-10-CM

## 2012-07-23 DIAGNOSIS — R197 Diarrhea, unspecified: Secondary | ICD-10-CM

## 2012-07-23 DIAGNOSIS — D702 Other drug-induced agranulocytosis: Secondary | ICD-10-CM

## 2012-07-23 DIAGNOSIS — I4891 Unspecified atrial fibrillation: Secondary | ICD-10-CM

## 2012-07-23 DIAGNOSIS — K769 Liver disease, unspecified: Secondary | ICD-10-CM

## 2012-07-23 DIAGNOSIS — R609 Edema, unspecified: Secondary | ICD-10-CM

## 2012-07-23 DIAGNOSIS — R5081 Fever presenting with conditions classified elsewhere: Secondary | ICD-10-CM

## 2012-07-23 DIAGNOSIS — D709 Neutropenia, unspecified: Secondary | ICD-10-CM

## 2012-07-23 DIAGNOSIS — K7460 Unspecified cirrhosis of liver: Secondary | ICD-10-CM

## 2012-07-23 LAB — CBC WITH DIFFERENTIAL/PLATELET
HCT: 29.9 % — ABNORMAL LOW (ref 36.0–46.0)
Hemoglobin: 9.6 g/dL — ABNORMAL LOW (ref 12.0–15.0)
Lymphocytes Relative: 17 % (ref 12–46)
Monocytes Absolute: 0.4 10*3/uL (ref 0.1–1.0)
Monocytes Relative: 7 % (ref 3–12)
Neutro Abs: 4.3 10*3/uL (ref 1.7–7.7)
WBC: 5.9 10*3/uL (ref 4.0–10.5)

## 2012-07-23 NOTE — Progress Notes (Signed)
Subjective:    Patient ID: [name], female    DOB: 03-22-1956, 57 y.o.   MRN: [id number]  HPI  [name] is a 57 yr old female who presents today for hospital follow up.  She was admitted 3/3-3/12. Records are reviewed.  During this hospitalization she was evaluated for diarrhea/dehydration, hyponatremia, neutopenic fever, and pancytopenia.  This was all following her first dose of chemotherapy. She also developed AF with RVR during this visit and was placed on beta blocker and calciu channell blocker by cardiology.  It was felt that she was a poor candidate for anticoagulation and she was placed on asprin 81mg .  Stool studies during this hospitalization were negative including a neg c diff.  Blood cultures were also neg x 2.    She reports that she is not able to eat solid food due to metallic taste.  She reports swelling ofbilateral feet.  She is wearing support hose.  She reports near resolution of diarrhea.   AF- she denies CP or palpitations.  Denies side effects from cardizem or toprol.   She is requesting a handicap placard form today.   Review of Systems    see HPI  Past Medical History  Diagnosis Date  . Depression   . DVT (deep venous thrombosis)     hx of on HRT left leg ~2006  . GERD (gastroesophageal reflux disease)   . Hyperlipidemia   . Hypertension   . Hypothyroidism   . PPD positive, treated     rx inh   . OSA on CPAP   . Diabetes mellitus   . 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 Breast cancer     a. 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 Marital Status: Married    Spouse Name: N/A    Number of Children: 2  . Years of Education: N/A   Occupational History  . SECRETARY [employer]   Social History Main Topics  . Smoking status: Former Games developer  . Smokeless tobacco: Never Used  . Alcohol Use:  No  . Drug Use: No  . Sexually Active: No   Other Topics Concern  . Not on file   Social History Narrative   Married   Works at [employer] secretary   Daily caffeine use - 2 cups a day plus a couple of sodas a day   Pt doesn't exercise regularly   G2P2   H H of 5 soon to be 6 .     Past Surgical History  Procedure Laterality Date  . Tubal ligation    . Cholecystectomy    . Foot surgery    . Eye surgery    . Cataract extraction    . Percutaneous liver biopsy    . Breast biopsy      left breast  . Mastectomy modified radical  06/08/2012    Procedure: MASTECTOMY MODIFIED RADICAL;  Surgeon: Mariella Saa, MD;  Location: [facility] OR;  Service: General;  Laterality: Right;  . Portacath placement  06/08/2012    Procedure: INSERTION PORT-A-CATH;  Surgeon: Mariella Saa, MD;  Location: [facility] OR;  Service: General;  Laterality: Left;    Family History  Problem Relation Age of Onset  . Diabetes Mother   . Hypertension Mother   . Arthritis Mother   . Heart  disease Mother   . Heart failure Mother   . Other Mother     benign breast mass  . Stroke Father   . Heart disease Father   . Diabetes Paternal Grandmother   . Colon cancer Paternal Grandfather     Allergies  Allergen Reactions  . Olmesartan Medoxomil     REACTION: ? if cough  . Tetracycline Hcl     REACTION: unspecified  . Venlafaxine     REACTION: severe dry moouth  . Adhesive (Tape) Rash    Current Outpatient Prescriptions on File Prior to Visit  Medication Sig Dispense Refill  . ALPRAZolam (XANAX) 0.25 MG tablet Take 1 tablet (0.25 mg total) by mouth 3 (three) times daily as needed.  30 tablet  0  . Alum & Mag Hydroxide-Simeth (MAGIC MOUTHWASH) SOLN Take 5 mLs by mouth 4 (four) times daily.  15 mL  1  . aspirin EC 81 MG tablet Take 81 mg by mouth every morning.      [name] atorvastatin (LIPITOR) 10 MG tablet Take 10 mg by mouth at bedtime.   90 tablet  1  . buPROPion (WELLBUTRIN SR) 150 MG 12 hr tablet Take  150 mg by mouth every morning.       [name] dexamethasone (DECADRON) 4 MG tablet Take 4 mg by mouth. 2 tabs PO BID on day before and 2 days after chemo. 2 tabs PO in am only on day 4.      . diltiazem (CARDIZEM CD) 120 MG 24 hr capsule Take 1 capsule (120 mg total) by mouth daily.  30 capsule  0  . escitalopram (LEXAPRO) 10 MG tablet Take 10 mg by mouth at bedtime.      [name] glucose blood (TRUETEST TEST) test strip Use as instructed  100 each  12  . levothyroxine (SYNTHROID, LEVOTHROID) 125 MCG tablet Take 125 mcg by mouth daily before breakfast.      . lidocaine-prilocaine (EMLA) cream Apply topically as needed.  30 g  1  . LORazepam (ATIVAN) 0.5 MG tablet 1 tab up to twice daily PRN anxiety, insomnia and/or nausea  30 tablet  0  . metoprolol succinate (TOPROL XL) 25 MG 24 hr tablet Take 3 tablets (75 mg total) by mouth daily. Take with or immediately following a meal.  90 tablet  1  . omeprazole (PRILOSEC) 20 MG capsule Take 1 capsule (20 mg total) by mouth 2 (two) times daily.  180 capsule  2  . oxyCODONE (OXY IR/ROXICODONE) 5 MG immediate release tablet Take 1 tablet (5 mg total) by mouth every 4 (four) hours as needed.  30 tablet  0  . prochlorperazine (COMPAZINE) 10 MG tablet 1 tab PO ACHS x 3 days after chemo, then 1 tab PO q 6 hr PRN nausea  30 tablet  2  . promethazine (PHENERGAN) 12.5 MG tablet Take 1 tablet (12.5 mg total) by mouth every 4 (four) hours as needed for nausea.  30 tablet  0  . protein supplement (RESOURCE BENEPROTEIN) 6 g POWD Take 1 scoop (6 g total) by mouth 3 (three) times daily with meals.  30 Can  1  . saccharomyces boulardii (FLORASTOR) 250 MG capsule Take 1 capsule (250 mg total) by mouth 2 (two) times daily.  60 capsule  1  . saxagliptin HCl (ONGLYZA) 5 MG TABS tablet Take 5 mg by mouth every morning.       No current facility-administered medications on file prior to visit.    BP 140/70  Pulse 77  Temp(Src) 98 F (36.7 C) (Oral)  Resp 18  Ht 5\' 4"  (1.626 m)  Wt 207  lb 1.3 oz (93.931 kg)  BMI 35.53 kg/m2  SpO2 97%    Objective:   Physical Exam  Constitutional: She is oriented to person, place, and time. She appears well-developed and well-nourished. No distress.  HENT:  Head: Normocephalic and atraumatic.  Cardiovascular: Normal rate and regular rhythm.   No murmur heard. Pulmonary/Chest: Effort normal and breath sounds normal. No respiratory distress. She has no wheezes. She has no rales. She exhibits no tenderness.  Abdominal: Soft. Bowel sounds are normal. She exhibits no distension and no mass. There is no tenderness. There is no rebound and no guarding.  Musculoskeletal:  3+ bilateral pedal edema.  Neurological: She is alert and oriented to person, place, and time.  Skin: Skin is warm and dry. No rash noted. No erythema. No pallor.  Wearing wig  Psychiatric: She has a normal mood and affect. Her behavior is normal. Judgment and thought content normal.          Assessment & Plan:

## 2012-07-23 NOTE — Assessment & Plan Note (Signed)
HR and BP stable today on cardizem/toprol.  Continue same + ASA 81mg .

## 2012-07-23 NOTE — Assessment & Plan Note (Signed)
Will repeat BMET today. Renal insufficiency was likely related to her recent dehydration.

## 2012-07-23 NOTE — Assessment & Plan Note (Signed)
Resolved

## 2012-07-23 NOTE — Assessment & Plan Note (Signed)
Pt with bilateral Leg swelling, likely related to hypoalbuminemia. I have advised her not to resume diuretic at this time, but to continue support hose.

## 2012-07-23 NOTE — Assessment & Plan Note (Signed)
Check follow up lft.  Plan follow up visit with Dr. Leone Payor.

## 2012-07-23 NOTE — Patient Instructions (Addendum)
Please complete your lab work prior to leaving.  Please follow up in 1 month.

## 2012-07-24 LAB — HEPATIC FUNCTION PANEL
ALT: 14 U/L (ref 0–35)
AST: 29 U/L (ref 0–37)
Albumin: 2.7 g/dL — ABNORMAL LOW (ref 3.5–5.2)

## 2012-07-24 LAB — BASIC METABOLIC PANEL
BUN: 4 mg/dL — ABNORMAL LOW (ref 6–23)
Chloride: 113 mEq/L — ABNORMAL HIGH (ref 96–112)
Glucose, Bld: 76 mg/dL (ref 70–99)
Potassium: 3.3 mEq/L — ABNORMAL LOW (ref 3.5–5.3)

## 2012-07-27 ENCOUNTER — Telehealth: Payer: Self-pay | Admitting: *Deleted

## 2012-07-27 ENCOUNTER — Ambulatory Visit: Payer: 59 | Admitting: Physician Assistant

## 2012-07-27 ENCOUNTER — Ambulatory Visit: Payer: 59

## 2012-07-27 ENCOUNTER — Other Ambulatory Visit: Payer: 59 | Admitting: Lab

## 2012-07-27 ENCOUNTER — Other Ambulatory Visit: Payer: Self-pay | Admitting: *Deleted

## 2012-07-27 NOTE — Telephone Encounter (Signed)
Pt left 2 messages stating she is scheduled for lab and visit but is nauseated and is requesting a return call ASAP- messages were left before 9am and retrieved at approximately 10am.  Note appt for lab and AB/PA was for 915 and 945.  This RN returned call upon retrieving and obtained message stating number does not have VM set up.

## 2012-07-27 NOTE — Telephone Encounter (Signed)
sw pt and rescheduled her lab appt for 07/28/12 @ 11:45am.

## 2012-07-27 NOTE — Progress Notes (Signed)
Patient called today to reschedule today's appointment.  She will have labs and visit on 3/19 prior to her chemo on 3/20.  Zollie Scale, PA-C 07/27/2012

## 2012-07-27 NOTE — Telephone Encounter (Signed)
Received call from pt stating disability dept is requesting records to support letter from 07/23/12 and need to be out of work. 07/23/12 office note copied and will be faxed to [phone] [facility] [employer]). Do you want me to send any other records?

## 2012-07-27 NOTE — Telephone Encounter (Signed)
Yes, please also send them discharge summary from most recent hospitalization. Thanks.

## 2012-07-27 NOTE — Telephone Encounter (Signed)
Records faxed.

## 2012-07-28 ENCOUNTER — Ambulatory Visit: Payer: 59

## 2012-07-28 ENCOUNTER — Other Ambulatory Visit (HOSPITAL_BASED_OUTPATIENT_CLINIC_OR_DEPARTMENT_OTHER): Payer: 59 | Admitting: Lab

## 2012-07-28 ENCOUNTER — Telehealth: Payer: Self-pay | Admitting: *Deleted

## 2012-07-28 DIAGNOSIS — C50911 Malignant neoplasm of unspecified site of right female breast: Secondary | ICD-10-CM

## 2012-07-28 DIAGNOSIS — C50919 Malignant neoplasm of unspecified site of unspecified female breast: Secondary | ICD-10-CM

## 2012-07-28 LAB — COMPREHENSIVE METABOLIC PANEL (CC13)
ALT: 25 U/L (ref 0–55)
AST: 72 U/L — ABNORMAL HIGH (ref 5–34)
Alkaline Phosphatase: 141 U/L (ref 40–150)
CO2: 29 mEq/L (ref 22–29)
Sodium: 146 mEq/L — ABNORMAL HIGH (ref 136–145)
Total Bilirubin: 0.58 mg/dL (ref 0.20–1.20)
Total Protein: 6.3 g/dL — ABNORMAL LOW (ref 6.4–8.3)

## 2012-07-28 LAB — CBC WITH DIFFERENTIAL/PLATELET
BASO%: 1 % (ref 0.0–2.0)
EOS%: 6.4 % (ref 0.0–7.0)
LYMPH%: 20.4 % (ref 14.0–49.7)
MCHC: 32.1 g/dL (ref 31.5–36.0)
MONO#: 0.4 10*3/uL (ref 0.1–0.9)
Platelets: 177 10*3/uL (ref 145–400)
RBC: 4.18 10*6/uL (ref 3.70–5.45)
WBC: 3.3 10*3/uL — ABNORMAL LOW (ref 3.9–10.3)

## 2012-07-28 NOTE — Telephone Encounter (Signed)
Received message from pt's husband that [place] financial reports they have not received our fax. Faxed information again today at 3:20pm and notified pt's husband.

## 2012-07-29 ENCOUNTER — Ambulatory Visit (HOSPITAL_BASED_OUTPATIENT_CLINIC_OR_DEPARTMENT_OTHER): Payer: 59 | Admitting: Physician Assistant

## 2012-07-29 ENCOUNTER — Ambulatory Visit (INDEPENDENT_AMBULATORY_CARE_PROVIDER_SITE_OTHER): Payer: No Typology Code available for payment source | Admitting: General Surgery

## 2012-07-29 ENCOUNTER — Ambulatory Visit (HOSPITAL_BASED_OUTPATIENT_CLINIC_OR_DEPARTMENT_OTHER): Payer: 59

## 2012-07-29 ENCOUNTER — Encounter: Payer: Self-pay | Admitting: Physician Assistant

## 2012-07-29 ENCOUNTER — Encounter (INDEPENDENT_AMBULATORY_CARE_PROVIDER_SITE_OTHER): Payer: Self-pay | Admitting: General Surgery

## 2012-07-29 ENCOUNTER — Encounter: Payer: Self-pay | Admitting: Oncology

## 2012-07-29 VITALS — BP 171/75 | HR 73 | Temp 97.5°F | Resp 20 | Ht 64.0 in | Wt 185.7 lb

## 2012-07-29 VITALS — BP 120/70 | HR 91 | Temp 98.0°F | Resp 18 | Ht 64.0 in | Wt 185.8 lb

## 2012-07-29 DIAGNOSIS — Z170 Estrogen receptor positive status [ER+]: Secondary | ICD-10-CM

## 2012-07-29 DIAGNOSIS — C50019 Malignant neoplasm of nipple and areola, unspecified female breast: Secondary | ICD-10-CM

## 2012-07-29 DIAGNOSIS — E876 Hypokalemia: Secondary | ICD-10-CM

## 2012-07-29 DIAGNOSIS — C50919 Malignant neoplasm of unspecified site of unspecified female breast: Secondary | ICD-10-CM

## 2012-07-29 DIAGNOSIS — C50911 Malignant neoplasm of unspecified site of right female breast: Secondary | ICD-10-CM

## 2012-07-29 DIAGNOSIS — D509 Iron deficiency anemia, unspecified: Secondary | ICD-10-CM

## 2012-07-29 DIAGNOSIS — R634 Abnormal weight loss: Secondary | ICD-10-CM

## 2012-07-29 DIAGNOSIS — Z5111 Encounter for antineoplastic chemotherapy: Secondary | ICD-10-CM

## 2012-07-29 DIAGNOSIS — R630 Anorexia: Secondary | ICD-10-CM

## 2012-07-29 MED ORDER — DEXTROSE 5 % IV SOLN: 65.0000 mg/m2 | Freq: Once | INTRAVENOUS | Status: DC

## 2012-07-29 MED ORDER — SODIUM CHLORIDE 0.9 % IJ SOLN
10.0000 mL | INTRAMUSCULAR | Status: DC | PRN
  Administered 2012-07-29: 10 mL
  Filled 2012-07-29: qty 10

## 2012-07-29 MED ORDER — DEXAMETHASONE SODIUM PHOSPHATE 4 MG/ML IJ SOLN
20.0000 mg | Freq: Once | INTRAMUSCULAR | Status: AC
  Administered 2012-07-29: 20 mg via INTRAVENOUS

## 2012-07-29 MED ORDER — SODIUM CHLORIDE 0.9 % IV SOLN
Freq: Once | INTRAVENOUS | Status: AC
  Administered 2012-07-29: 11:00:00 via INTRAVENOUS

## 2012-07-29 MED ORDER — HEPARIN SOD (PORK) LOCK FLUSH 100 UNIT/ML IV SOLN
500.0000 [IU] | Freq: Once | INTRAVENOUS | Status: AC | PRN
  Administered 2012-07-29: 500 [IU]
  Filled 2012-07-29: qty 5

## 2012-07-29 MED ORDER — ONDANSETRON 16 MG/50ML IVPB (CHCC)
16.0000 mg | Freq: Once | INTRAVENOUS | Status: AC
  Administered 2012-07-29: 16 mg via INTRAVENOUS

## 2012-07-29 MED ORDER — SODIUM CHLORIDE 0.9 % IV SOLN
65.0000 mg/m2 | Freq: Once | INTRAVENOUS | Status: AC
  Administered 2012-07-29: 130 mg via INTRAVENOUS
  Filled 2012-07-29: qty 13

## 2012-07-29 MED ORDER — SODIUM CHLORIDE 0.9 % IV SOLN
513.4000 mg | Freq: Once | INTRAVENOUS | Status: AC
  Administered 2012-07-29: 510 mg via INTRAVENOUS
  Filled 2012-07-29: qty 51

## 2012-07-29 NOTE — Patient Instructions (Addendum)
Do your arm stretching exercises daily

## 2012-07-29 NOTE — Progress Notes (Signed)
History: Patient returns over 6 weeks following her right modified mastectomy. She has started chemotherapy and had a lot of GI side effects after her first round requiring hospitalization. She otherwise now is feeling well. No complaints in regards to her or bruits incision.  Exam: BP 120/70  Pulse 91  Temp(Src) 98 F (36.7 C) (Temporal)  Resp 18  Ht 5\' 4"  (1.626 m)  Wt 185 lb 12.8 oz (84.278 kg)  BMI 31.88 kg/m2 General: Appears well Chest wall: Wound is well-healed. No seroma and no arm edema. She has nearly complete range of motion of her arm.  Assessment and plan: Doing well post mastectomy without complication. I'll see her for long-term follow up in 6 months. We gave her instruction for range of motion exercises for herarm.

## 2012-07-29 NOTE — Patient Instructions (Signed)
[facility] Discharge Instructions for Patients Receiving Chemotherapy  Today you received the following chemotherapy agents Taxotere/Carboplatin To help prevent nausea and vomiting after your treatment, we encourage you to take your nausea medication as prescribed.  If you develop nausea and vomiting that is not controlled by your nausea medication, call the clinic. If it is after clinic hours your family physician or the after hours number for the clinic or go to the Emergency Department.   BELOW ARE SYMPTOMS THAT SHOULD BE REPORTED IMMEDIATELY:  *FEVER GREATER THAN 100.5 F  *CHILLS WITH OR WITHOUT FEVER  NAUSEA AND VOMITING THAT IS NOT CONTROLLED WITH YOUR NAUSEA MEDICATION  *UNUSUAL SHORTNESS OF BREATH  *UNUSUAL BRUISING OR BLEEDING  TENDERNESS IN MOUTH AND THROAT WITH OR WITHOUT PRESENCE OF ULCERS  *URINARY PROBLEMS  *BOWEL PROBLEMS  UNUSUAL RASH Items with * indicate a potential emergency and should be followed up as soon as possible.  One of the nurses will contact you 24 hours after your treatment. Please let the nurse know about any problems that you may have experienced. Feel free to call the clinic you have any questions or concerns. The clinic phone number is [phone].   I have been informed and understand all the instructions given to me. I know to contact the clinic, my physician, or go to the Emergency Department if any problems should occur. I do not have any questions at this time, but understand that I may call the clinic during office hours   should I have any questions or need assistance in obtaining follow up care.    __________________________________________  _____________  __________ Signature of Patient or Authorized Representative            Date                   Time    __________________________________________ Nurse's Signature

## 2012-07-29 NOTE — Progress Notes (Signed)
ID: [name]   DOB: Jan 08, 1956  MR#: [id number]  AYT#:[id number]  PCP: [name]., NP GYN:  SU: Glenna Fellows OTHER TF:[id number] Rodena Medin, Burna Mortimer Panosh   HISTORY OF PRESENT ILLNESS: [name] noted a mass in her right breast mid December, and as it did not spontaneously resolve over a couple of weeks she brought it to her primary physician's attention. She was set up for diagnostic mammography and right breast ultrasonography at the [facility] Center 05/10/2012. (Note that the patient's most recent prior mammography had been in October 2008). The current study showed a spiculated mass in the superior subareolar portion of the right breast measuring approximately 5 cm and associated with pleomorphic calcifications. This was firm and palpable. There was right nipple retraction and skin thickening. Ultrasound confirmed an irregularly marginated hypoechoic mass measuring 3.5 cm by ultrasound, and an abnormal appearing lower right axillary lymph node measuring 2.6 cm.  Biopsies of both the breast mass and the abnormal appearing lymph node were performed 05/21/2012. Both showed an invasive ductal carcinoma, grade 2, with similar prognostic panels (the breast mass was 100% estrogen and 73% progesterone receptor positive, with an MIB-1 of 5%; the lymph node was 100% estrogen 100% progesterone receptor positive, with an MIB-1 of 20%). Both masses were HER-2 negative.  Breast MRI obtained at [facility] imaging 05/29/2012 confirmed a dominant mass in the retroareolar right breast measuring 4.4 cm maximally. There was a satellite nodule inferior and lateral to this mass, measuring 1.7 cm. There were no other masses in either breast. Aside from the previously biopsied lymph node there were other mildly enhancing level I right axillary lymph nodes which did not appear pathologic. There was no other lymphadenopathy noted. The patient's subsequent history is as detailed below.   INTERVAL HISTORY: [name]  returns today accompanied by her husband [name], for followup of her right breast carcinoma. She is due for day 1 cycle 2 of adjuvant docetaxel/cyclophosphamide given on a Q. three-week basis.  Interval history is remarkable for [name] having received her first dose of chemotherapy on an every 25th. She subsequently developed diarrhea, dehydration, pancytopenia, and neutropenic fever. She was admitted to the hospital on 07/12/2012, and was discharged on 07/21/2012.   [name] tells me she actually feels quite good today. Her energy level is good. She has only occasional loose stools, but no significant diarrhea. She's had no nausea or emesis.  [name]'s biggest complaint is the fact that she is completely unable to eat. In fact she tells me she has not eaten anything substantial since the night following chemotherapy. Although she denies actual nausea, she tells me everything "tastes terrible and metallic" and she is simply unable to force food down. There's no physical problem with swallowing. She's had no oral ulcerations, oral sensitivity, or sore throat. She is drinking lots of water and an occasional soft drink.   REVIEW OF SYSTEMS: [name] has  had no additional fevers or chills. She has had no rashes, abnormal bruising, or abnormal bleeding.  She has no cough, production, pleurisy, shortness of breath, or chest pain. She denies any unusual headaches, dizziness, or change in vision. Currently, she has no unusual myalgias, arthralgias, or bony pain. She's had no signs of peripheral neuropathy or peripheral swelling.  A detailed review of systems is otherwise stable and noncontributory.   PAST MEDICAL HISTORY: Past Medical History  Diagnosis Date  . Depression   . DVT (deep venous thrombosis)     hx of on HRT left leg ~2006  . GERD (  gastroesophageal reflux disease)   . Hyperlipidemia   . Hypertension   . Hypothyroidism   . PPD positive, treated     rx inh   . OSA on CPAP   . Diabetes mellitus    . 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 Breast cancer     a. 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 Tubal ligation    . Cholecystectomy    . Foot surgery    . Eye surgery    . Cataract extraction    . Percutaneous liver biopsy    . Breast biopsy      left breast  . Mastectomy modified radical  06/08/2012    Procedure: MASTECTOMY MODIFIED RADICAL;  Surgeon: Mariella Saa, MD;  Location: [facility] OR;  Service: General;  Laterality: Right;  . Portacath placement  06/08/2012    Procedure: INSERTION PORT-A-CATH;  Surgeon: Mariella Saa, MD;  Location: [facility] OR;  Service: General;  Laterality: Left;    FAMILY HISTORY Family History  Problem Relation Age of Onset  . Diabetes Mother   . Hypertension Mother   . Arthritis Mother   . Heart disease Mother   . Heart failure Mother   . Other Mother     benign breast mass  . Stroke Father   . Heart disease Father   . Diabetes Paternal Grandmother   . Colon cancer Paternal Grandfather    the patient's father died in his 39s with a history of dementia. He had had prior strokes. The patient's mother died in her 78s, with a history of congestive heart failure. [name] had no brothers, one sister. There is no history of breast or ovarian cancer in the family.  GYNECOLOGIC HISTORY: Menarche age 24, first live birth age 60, she is GX P2, menopause approximately 15 years ago, on hormone replacement until 2010.  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 Her husband [name] works for [employer]. Daughter [name] is a Scientist, forensic and lives in [place]. Daughter [name] and her family (husband and 2 children aged 5 and 1-1/2 years) currently live with the patient. [name] is a  member of a [employer].   ADVANCED DIRECTIVES: Not in place  HEALTH MAINTENANCE: History  Substance Use Topics  . Smoking status: Former Games developer  . Smokeless tobacco: Never Used  . Alcohol Use: No     Colonoscopy: Never  PAP: Does not recall  Bone density: Never  Lipid panel:  Allergies  Allergen Reactions  . Olmesartan Medoxomil     REACTION: ? if cough  . Tetracycline Hcl     REACTION: unspecified  . Venlafaxine     REACTION: severe dry moouth  . Adhesive (Tape) Rash    Current Outpatient Prescriptions  Medication Sig Dispense Refill  . ALPRAZolam (XANAX) 0.25 MG tablet Take 1 tablet (0.25 mg total) by mouth 3 (three) times daily as needed.  30 tablet  0  . Alum & Mag Hydroxide-Simeth (MAGIC MOUTHWASH) SOLN Take 5 mLs by mouth 4 (four) times daily.  15 mL  1  . aspirin EC 81 MG tablet Take 81 mg by mouth every morning.      [name] atorvastatin (LIPITOR) 10 MG tablet Take 10 mg by mouth at bedtime.  90 tablet  1  . buPROPion (WELLBUTRIN SR) 150 MG 12 hr tablet Take 150 mg by mouth every morning.       [name] dexamethasone (DECADRON) 4 MG tablet Take 4 mg by mouth. 2 tabs PO BID on day before and 2 days after chemo. 2 tabs PO in am only on day 4.      . diltiazem (CARDIZEM CD) 120 MG 24 hr capsule Take 1 capsule (120 mg total) by mouth daily.  30 capsule  0  . escitalopram (LEXAPRO) 10 MG tablet Take 10 mg by mouth at bedtime.      [name] glucose blood (TRUETEST TEST) test strip Use as instructed  100 each  12  . levothyroxine (SYNTHROID, LEVOTHROID) 125 MCG tablet Take 125 mcg by mouth daily before breakfast.      . lidocaine-prilocaine (EMLA) cream Apply topically as needed.  30 g  1  . LORazepam (ATIVAN) 0.5 MG tablet 1 tab up to twice daily PRN anxiety, insomnia and/or nausea  30 tablet  0  . metoprolol succinate (TOPROL XL) 25 MG 24 hr tablet Take 3 tablets (75 mg total) by mouth daily. Take with or immediately following a meal.  90 tablet  1  . omeprazole (PRILOSEC) 20 MG  capsule Take 1 capsule (20 mg total) by mouth 2 (two) times daily.  180 capsule  2  . prochlorperazine (COMPAZINE) 10 MG tablet 1 tab PO ACHS x 3 days after chemo, then 1 tab PO q 6 hr PRN nausea  30 tablet  2  . protein supplement (RESOURCE BENEPROTEIN) 6 g POWD Take 1 scoop (6 g total) by mouth 3 (three) times daily with meals.  30 Can  1  . saccharomyces boulardii (FLORASTOR) 250 MG capsule Take 1 capsule (250 mg total) by mouth 2 (two) times daily.  60 capsule  1  . saxagliptin HCl (ONGLYZA) 5 MG TABS tablet Take 5 mg by mouth every morning.       No current facility-administered medications for this visit.   Facility-Administered Medications Ordered in Other Visits  Medication Dose Route Frequency Provider Last Rate Last Dose  . CARBOplatin (PARAPLATIN) 510 mg in sodium chloride 0.9 % 250 mL chemo infusion  510 mg Intravenous Once Heide Brossart G Carrera Kiesel, PA-C      . DOCEtaxel (TAXOTERE) 130 mg in sodium chloride 0.9 % 250 mL chemo infusion  65 mg/m2 (Order-Specific) Intravenous Once Lowella Dell, MD 263 mL/hr at 07/29/12 1148 130 mg at 07/29/12 1148  . heparin lock flush 100 unit/mL  500 Units Intracatheter Once PRN Zymere Patlan Allegra Grana, PA-C      . sodium chloride 0.9 % injection 10 mL  10 mL Intracatheter PRN Kavin Weckwerth Allegra Grana, PA-C        OBJECTIVE: Middle-aged white woman who appears tired but is in no acute distress Filed Vitals:   07/29/12 0951  BP: 171/75  Pulse: 73  Temp: 97.5 F (36.4 C)  Resp: 20     Body mass index is 31.86 kg/(m^2).    ECOG FS: 2 Filed Weights   07/29/12 0951  Weight: 185 lb 11.2 oz (84.233 kg)   HEENT:  Sclerae anicteric.  Oropharynx clear with no ulcerations or evidence of candidiasis.  Nodes:  No cervical or supraclavicular lymphadenopathy palpated.  Breast Exam:  Deferred. Axillae are benign bilaterally the palpable adenopathy. Lungs:  Clear to auscultation bilaterally.  No wheezes or rhonchi. Heart:  Regular rate and rhythm.   Abdomen:  Soft, nontender.  Positive  bowel sounds.  Musculoskeletal:  No focal spinal tenderness to palpation.  Extremities: No peripheral swelling.   Skin:  Good skin turgor Neuro:  Nonfocal. Well oriented with tired affect    LAB RESULTS: Lab Results  Component Value Date   WBC 3.3* 07/28/2012   NEUTROABS 2.0 07/28/2012   HGB 10.6* 07/28/2012   HCT 33.1* 07/28/2012   MCV 79.1* 07/28/2012   PLT 177 07/28/2012      Chemistry      Component Value Date/Time   NA 146* 07/28/2012 1145   NA 147* 07/23/2012 1342   K 3.4* 07/28/2012 1145   K 3.3* 07/23/2012 1342   CL 108* 07/28/2012 1145   CL 113* 07/23/2012 1342   CO2 29 07/28/2012 1145   CO2 26 07/23/2012 1342   BUN 5.0* 07/28/2012 1145   BUN 4* 07/23/2012 1342   CREATININE 0.9 07/28/2012 1145   CREATININE 1.30* 07/23/2012 1342   CREATININE 1.61* 07/21/2012 0455      Component Value Date/Time   CALCIUM 8.2* 07/28/2012 1145   CALCIUM 7.5* 07/23/2012 1342   ALKPHOS 141 07/28/2012 1145   ALKPHOS 107 07/23/2012 1342   AST 72* 07/28/2012 1145   AST 29 07/23/2012 1342   ALT 25 07/28/2012 1145   ALT 14 07/23/2012 1342   BILITOT 0.58 07/28/2012 1145   BILITOT 0.6 07/23/2012 1342       Lab Results  Component Value Date   LABCA2 49* 06/04/2012    STUDIES: Dg Chest 2 View  07/12/2012  *RADIOLOGY REPORT*  Clinical Data: Fever.  Breast cancer  CHEST - 2 VIEW  Comparison: 06/08/2012  Findings: Lungs are clear without evidence of pneumonia.  No heart failure or effusion.  Negative for mass lesion.  Port-A-Cath tip in the SVC.  IMPRESSION: No acute abnormality.   Original Report Authenticated By: [name], M.D.    US Renal  07/16/2012  *RADIOLOGY REPORT*  Clinical Data: Renal failure  RENAL/URINARY TRACT ULTRASOUND COMPLETE  Comparison:  None.  Findings:  Right Kidney:  Measures 14.6 cm.  No mass or hydronephrosis.  Left Kidney:  Measures 14.2 cm.  No mass or hydronephrosis.  Bladder:  Within normal limits.  Additional comments: Pelvic ascites.  IMPRESSION: Negative renal ultrasound.    Original Report Authenticated By: [name], M.D.    Nm Pet Image Initial (pi) Skull Base To Thigh  07/05/2012  *RADIOLOGY REPORT*  Clinical Data: Initial treatment strategy for right breast carcinoma.  NUCLEAR MEDICINE PET SKULL BASE TO THIGH  Fasting Blood Glucose:  100  Technique:  18.8 mCi F-18 FDG was injected intravenously. CT data was obtained and used for attenuation correction and anatomic localization only.  (This was not acquired as a diagnostic CT examination.) Additional exam technical data entered on technologist worksheet.  Comparison:  None  Findings:  Neck: No hypermetabolic lymph nodes in the neck.  Chest:  No hypermetabolic mediastinal or hilar nodes.  No hypermetabolic axillary lymph nodes identified.  Muscular activity noted in left shoulder girdle.  No suspicious pulmonary nodules on the CT scan.  Abdomen/Pelvis:  No abnormal hypermetabolic activity within the liver, pancreas, adrenal glands, or spleen.  No hypermetabolic lymph nodes in the abdomen or pelvis.  Incidental findings noted on noncontrast CT or hepatic cirrhosis and mild splenomegaly, consistent with portal venous hypertension.  No evidence of ascites.  Several small uterine fibroids are also noted, at least one showing metabolic activity.  Skeleton:  No focal hypermetabolic activity to suggest skeletal metastasis.  IMPRESSION:  1.  No metabolically active malignancy identified. 2.  Incidental findings include hepatic cirrhosis, mild splenomegaly, and small uterine fibroids.   Original Report Authenticated By: Myles Rosenthal, M.D.        ASSESSMENT: 57 y.o. [name]   (1)  status post right mastectomy under the care of Dr. 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 Lymphovascular and appearing neural invasion was identified. 2 of 18 lymph nodes were involved.  Margins were clear. Pathologic staging pT3, pN1a, stage IIIA.  (Note the original clinical staging was IIB.)   (2)  being treated in the adjuvant setting with docetaxel/cyclophosphamide given every 3 weeks, with Neulasta on day 2 for granulocyte support. First treatment was given on 07/06/2012. The original goal was to complete a total of 6 cycles, but due to in tolerance, this has been decreased to 4 cycles, and we have also decreased her doses by 15% beginning with cycle 2.   (3) patient will need postmastectomy radiation, which will be followed by antiestrogen therapy.  (4)  comorbidities include diabetes, hypertension, and chronic liver disease with cirrhosis and fatty liver.   PLAN: This case was reviewed with Dr. [name]. Karene will proceed to treatment today as scheduled for day 1 cycle 2 of 4 planned q. three-week doses of docetaxel/carboplatin today. Per Dr. [name], we will decrease the dose of both agents by 15%, and this is also been reviewed with our pharmacist. She'll receive her Neulasta injection tomorrow on day 2.  I encouraged Adeana to continue trying various foods to find something that tastes good to her. She does need to eat and maintain her strength, but also reiterated the point that she needs to keep herself very well hydrated which she seems to be doing well. Also asked her to contact us immediately with any vomiting or increased diarrhea.  I think might be helpful for Kalli to see one of our nutritionists, and I will try to get that scheduled for her.  I will plan on seeing her back next week for followup on March 26. We will also be repeating a metabolic panel that day, primarily to follow her potassium and her calcium levels.  She and her husband both voice understanding and agreement with this plan, and know to call with any changes or problems.   Damascus Feldpausch    07/29/2012

## 2012-07-30 ENCOUNTER — Telehealth: Payer: Self-pay | Admitting: *Deleted

## 2012-07-30 ENCOUNTER — Ambulatory Visit (HOSPITAL_BASED_OUTPATIENT_CLINIC_OR_DEPARTMENT_OTHER): Payer: 59

## 2012-07-30 VITALS — BP 171/76 | HR 89 | Temp 97.4°F

## 2012-07-30 DIAGNOSIS — C50019 Malignant neoplasm of nipple and areola, unspecified female breast: Secondary | ICD-10-CM

## 2012-07-30 DIAGNOSIS — C50911 Malignant neoplasm of unspecified site of right female breast: Secondary | ICD-10-CM

## 2012-07-30 DIAGNOSIS — C773 Secondary and unspecified malignant neoplasm of axilla and upper limb lymph nodes: Secondary | ICD-10-CM

## 2012-07-30 DIAGNOSIS — Z5189 Encounter for other specified aftercare: Secondary | ICD-10-CM

## 2012-07-30 MED ORDER — PEGFILGRASTIM INJECTION 6 MG/0.6ML
6.0000 mg | Freq: Once | SUBCUTANEOUS | Status: AC
  Administered 2012-07-30: 6 mg via SUBCUTANEOUS
  Filled 2012-07-30: qty 0.6

## 2012-07-30 NOTE — Telephone Encounter (Signed)
Called pt with the appt for [name] pt refused . She stated she has seen several of nutritionist and the problem is she just cant eat. Therefore, she not coming.

## 2012-07-30 NOTE — Patient Instructions (Addendum)

## 2012-08-01 ENCOUNTER — Telehealth: Payer: Self-pay | Admitting: Internal Medicine

## 2012-08-01 ENCOUNTER — Encounter (HOSPITAL_COMMUNITY): Payer: Self-pay

## 2012-08-01 ENCOUNTER — Emergency Department (HOSPITAL_COMMUNITY)
Admission: EM | Admit: 2012-08-01 | Discharge: 2012-08-01 | Disposition: A | Discharge status: Home / Self Care | Payer: 59 | Attending: Emergency Medicine | Admitting: Emergency Medicine

## 2012-08-01 DIAGNOSIS — T451X5A Adverse effect of antineoplastic and immunosuppressive drugs, initial encounter: Secondary | ICD-10-CM | POA: Insufficient documentation

## 2012-08-01 DIAGNOSIS — K219 Gastro-esophageal reflux disease without esophagitis: Secondary | ICD-10-CM | POA: Insufficient documentation

## 2012-08-01 DIAGNOSIS — G4733 Obstructive sleep apnea (adult) (pediatric): Secondary | ICD-10-CM | POA: Insufficient documentation

## 2012-08-01 DIAGNOSIS — R1084 Generalized abdominal pain: Secondary | ICD-10-CM | POA: Insufficient documentation

## 2012-08-01 DIAGNOSIS — Z9851 Tubal ligation status: Secondary | ICD-10-CM | POA: Insufficient documentation

## 2012-08-01 DIAGNOSIS — Z9981 Dependence on supplemental oxygen: Secondary | ICD-10-CM | POA: Insufficient documentation

## 2012-08-01 DIAGNOSIS — E785 Hyperlipidemia, unspecified: Secondary | ICD-10-CM | POA: Insufficient documentation

## 2012-08-01 DIAGNOSIS — Z86718 Personal history of other venous thrombosis and embolism: Secondary | ICD-10-CM | POA: Insufficient documentation

## 2012-08-01 DIAGNOSIS — Z8719 Personal history of other diseases of the digestive system: Secondary | ICD-10-CM | POA: Insufficient documentation

## 2012-08-01 DIAGNOSIS — Z79899 Other long term (current) drug therapy: Secondary | ICD-10-CM | POA: Insufficient documentation

## 2012-08-01 DIAGNOSIS — R5381 Other malaise: Secondary | ICD-10-CM | POA: Insufficient documentation

## 2012-08-01 DIAGNOSIS — R112 Nausea with vomiting, unspecified: Secondary | ICD-10-CM | POA: Insufficient documentation

## 2012-08-01 DIAGNOSIS — R197 Diarrhea, unspecified: Secondary | ICD-10-CM | POA: Insufficient documentation

## 2012-08-01 DIAGNOSIS — Z7982 Long term (current) use of aspirin: Secondary | ICD-10-CM | POA: Insufficient documentation

## 2012-08-01 DIAGNOSIS — Z9089 Acquired absence of other organs: Secondary | ICD-10-CM | POA: Insufficient documentation

## 2012-08-01 DIAGNOSIS — Z853 Personal history of malignant neoplasm of breast: Secondary | ICD-10-CM | POA: Insufficient documentation

## 2012-08-01 DIAGNOSIS — E119 Type 2 diabetes mellitus without complications: Secondary | ICD-10-CM | POA: Insufficient documentation

## 2012-08-01 DIAGNOSIS — I10 Essential (primary) hypertension: Secondary | ICD-10-CM | POA: Insufficient documentation

## 2012-08-01 LAB — CBC WITH DIFFERENTIAL/PLATELET
Basophils Absolute: 0.1 10*3/uL (ref 0.0–0.1)
Eosinophils Absolute: 0 10*3/uL (ref 0.0–0.7)
Lymphocytes Relative: 15 % (ref 12–46)
MCHC: 32.3 g/dL (ref 30.0–36.0)
Monocytes Relative: 2 % — ABNORMAL LOW (ref 3–12)
Neutro Abs: 3.2 10*3/uL (ref 1.7–7.7)
Neutrophils Relative %: 80 % — ABNORMAL HIGH (ref 43–77)
Platelets: 124 10*3/uL — ABNORMAL LOW (ref 150–400)
RDW: 18.2 % — ABNORMAL HIGH (ref 11.5–15.5)
WBC: 4 10*3/uL (ref 4.0–10.5)

## 2012-08-01 LAB — COMPREHENSIVE METABOLIC PANEL
ALT: 55 U/L — ABNORMAL HIGH (ref 0–35)
Albumin: 2.7 g/dL — ABNORMAL LOW (ref 3.5–5.2)
Calcium: 7.9 mg/dL — ABNORMAL LOW (ref 8.4–10.5)
GFR calc Af Amer: >90 mL/min (ref >90)
Glucose, Bld: 141 mg/dL — ABNORMAL HIGH (ref 70–99)
Sodium: 141 mEq/L (ref 135–145)
Total Protein: 5.8 g/dL — ABNORMAL LOW (ref 6.0–8.3)

## 2012-08-01 LAB — LACTIC ACID, PLASMA: Lactic Acid, Venous: 1.7 mmol/L (ref 0.5–2.2)

## 2012-08-01 LAB — PROTIME-INR: INR: 1.73 — ABNORMAL HIGH (ref 0.00–1.49)

## 2012-08-01 MED ORDER — POTASSIUM CHLORIDE CRYS ER 20 MEQ PO TBCR
40.0000 meq | EXTENDED_RELEASE_TABLET | Freq: Once | ORAL | Status: AC
  Administered 2012-08-01: 40 meq via ORAL
  Filled 2012-08-01: qty 2

## 2012-08-01 MED ORDER — ONDANSETRON HCL 4 MG/2ML IJ SOLN
4.0000 mg | Freq: Once | INTRAMUSCULAR | Status: AC
  Administered 2012-08-01: 4 mg via INTRAVENOUS
  Filled 2012-08-01: qty 2

## 2012-08-01 MED ORDER — POTASSIUM CHLORIDE 10 MEQ/100ML IV SOLN
10.0000 meq | INTRAVENOUS | Status: DC
  Administered 2012-08-01 (×2): 10 meq via INTRAVENOUS
  Filled 2012-08-01 (×2): qty 100

## 2012-08-01 MED ORDER — SODIUM CHLORIDE 0.9 % IV BOLUS (SEPSIS)
1000.0000 mL | Freq: Once | INTRAVENOUS | Status: AC
  Administered 2012-08-01: 1000 mL via INTRAVENOUS

## 2012-08-01 MED ORDER — ONDANSETRON 4 MG PO TBDP: 4.0000 mg | ORAL_TABLET | Freq: Three times a day (TID) | ORAL | Status: DC | PRN

## 2012-08-01 NOTE — ED Provider Notes (Signed)
History     CSN: [id number]  Arrival date & time 08/01/12  5784   First MD Initiated Contact with Patient 08/01/12 [phone]      Chief Complaint  Patient presents with  . Nausea  . Emesis  . Diarrhea    HPI  The patient presents with ongoing nausea, vomiting, diarrhea, fatigue.  She has a notable history of breast cancer, including mastectomy, ongoing chemotherapy.  Her last chemotherapy session was 3 days ago.  Beginning the following day she had nausea, persistent postprandial crampy abdominal pain, diffuse, with vomiting, diarrhea.  Symptoms are minimal when not attempting to eat.  The patient does complain of ongoing, worsening fatigue without unilateral weakness, new fever, no dyspnea, no chest pain. The patient is intolerant of medication as well as food.   Past Medical History  Diagnosis Date  . Depression   . DVT (deep venous thrombosis)     hx of on HRT left leg ~2006  . GERD (gastroesophageal reflux disease)   . Hyperlipidemia   . Hypertension   . Hypothyroidism   . PPD positive, treated     rx inh   . OSA on CPAP   . Diabetes mellitus   . 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 Breast cancer     a. 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 Tubal ligation    . Cholecystectomy    . Foot surgery    . Eye surgery    . Cataract extraction    . Percutaneous liver biopsy    . Breast biopsy      left breast  . Mastectomy modified radical  06/08/2012    Procedure: MASTECTOMY MODIFIED RADICAL;  Surgeon: Mariella Saa, MD;  Location: [facility] OR;  Service: General;  Laterality: Right;  . Portacath placement  06/08/2012    Procedure: INSERTION PORT-A-CATH;  Surgeon: Mariella Saa, MD;  Location: [facility] OR;  Service: General;  Laterality: Left;    Family History  Problem Relation Age of Onset  .  Diabetes Mother   . Hypertension Mother   . Arthritis Mother   . Heart disease Mother   . Heart failure Mother   . Other Mother     benign breast mass  . Stroke Father   . Heart disease Father   . Diabetes Paternal Grandmother   . Colon cancer Paternal Grandfather     History  Substance Use Topics  . Smoking status: Former Games developer  . Smokeless tobacco: Never Used  . Alcohol Use: No    OB History   Grav Para Term Preterm Abortions TAB SAB Ect Mult Living   2 2              Review of Systems  Constitutional: Positive for fatigue.  HENT:       Per HPI, otherwise negative  Respiratory:       Per HPI, otherwise negative  Cardiovascular:       Per HPI, otherwise negative  Gastrointestinal: Positive for nausea, vomiting, abdominal pain and diarrhea.  Endocrine:       Negative aside from HPI  Genitourinary:       Neg aside from HPI   Musculoskeletal:       Per HPI, otherwise negative  Skin: Negative.   Neurological: Positive for weakness. Negative for syncope.  Hematological:       History of present illness    Allergies  Olmesartan medoxomil; Tetracycline hcl; Venlafaxine; and Adhesive  Home Medications   Current Outpatient Rx  Name  Route  Sig  Dispense  Refill  . aspirin EC 81 MG tablet   Oral   Take 81 mg by mouth every morning.         [name] atorvastatin (LIPITOR) 10 MG tablet   Oral   Take 10 mg by mouth at bedtime.    90 tablet   1     pt would like a 90 day supply   . buPROPion (WELLBUTRIN SR) 150 MG 12 hr tablet   Oral   Take 150 mg by mouth every morning.          [name] dexamethasone (DECADRON) 4 MG tablet   Oral   Take 4 mg by mouth as directed. Take 2 tabs twice daily on day before, day of, and for 1 day following chemo. On the second day following chemo, Take 2 tablets in the morning only.         . diltiazem (CARDIZEM CD) 120 MG 24 hr capsule   Oral   Take 120 mg by mouth every morning.         . escitalopram (LEXAPRO) 10 MG tablet    Oral   Take 10 mg by mouth at bedtime.         [name] levothyroxine (SYNTHROID, LEVOTHROID) 125 MCG tablet   Oral   Take 125 mcg by mouth daily before breakfast.         . lidocaine-prilocaine (EMLA) cream   Topical   Apply 1 application topically as needed (for port access).         . LORazepam (ATIVAN) 0.5 MG tablet   Oral   Take 1 mg by mouth 2 (two) times daily as needed (for nausea or sleep). 1 tab up to twice daily PRN anxiety, insomnia and/or nausea         . metoprolol succinate (TOPROL XL) 25 MG 24 hr tablet   Oral   Take 3 tablets (75 mg total) by mouth daily. Take with or immediately following a meal.   90 tablet   1   . omeprazole (PRILOSEC) 20 MG capsule   Oral   Take 1 capsule (20 mg total) by mouth 2 (two) times daily.   180 capsule   2   . prochlorperazine (COMPAZINE) 10 MG tablet   Oral   Take 10 mg by mouth as directed. Take 1 tablet before meals and at bedtime for 3 days after chemo, then 1 tab every 6 hours as needed for nausea         . saccharomyces boulardii (FLORASTOR) 250 MG capsule   Oral   Take 1 capsule (250 mg total) by mouth 2 (two) times daily.   60 capsule   1   . saxagliptin HCl (ONGLYZA) 5 MG TABS tablet   Oral   Take 5 mg by mouth every morning.           BP 144/64  Pulse 79  Temp(Src) 98 F (36.7 C) (Oral)  Resp 18  SpO2 97%  Physical Exam  Nursing note and vitals reviewed. Constitutional: She is oriented to person, place, and time. She appears well-developed and well-nourished. No distress.  HENT:  Head: Normocephalic and atraumatic.  Eyes: Conjunctivae and EOM are normal.  Cardiovascular: Normal rate and regular rhythm.   Pulmonary/Chest: Effort normal  and breath sounds normal. No stridor. No respiratory distress.    Abdominal: She exhibits no distension.  Musculoskeletal: She exhibits no edema.  Neurological: She is alert and oriented to person, place, and time. No cranial nerve deficit.  Skin: Skin is warm  and dry.  Psychiatric: She has a normal mood and affect.    ED Course  Procedures (including critical care time)  Labs Reviewed  CBC WITH DIFFERENTIAL - Abnormal; Notable for the following:    MCH 25.8 (*)    RDW 18.2 (*)    Platelets 124 (*)    Neutrophils Relative 80 (*)    Monocytes Relative 2 (*)    Basophils Relative 2 (*)    Lymphs Abs 0.6 (*)    All other components within normal limits  COMPREHENSIVE METABOLIC PANEL - Abnormal; Notable for the following:    Potassium 2.8 (*)    Glucose, Bld 141 (*)    Calcium 7.9 (*)    Total Protein 5.8 (*)    Albumin 2.7 (*)    AST 81 (*)    ALT 55 (*)    Alkaline Phosphatase 127 (*)    All other components within normal limits  LIPASE, BLOOD  URINALYSIS, ROUTINE W REFLEX MICROSCOPIC  PROTIME-INR  LACTIC ACID, PLASMA   No results found.   No diagnosis found.   10:40 AM Patient substantially better.   She would like to go home.  She will complete her potassium repletion orally.  MDM  The patient presents with nausea, vomiting, diarrhea, decreased by mouth tolerance.  Notably, the patient is currently receiving chemotherapy for breast cancer.  She has been evaluated for similar symptoms previously.  This episode, the patient is uncomfortable appearing, but in no distress, hemodynamically stable.  Patient's labs are largely unremarkable, with no pancytopenia, similar to her last evaluation.  She is hypokalemic, and received IV repletion, oral repletion.  She improved entirely, requested discharge.  She'll followup tomorrow with her oncologist for further evaluation and management        Gerhard Munch, MD 08/01/12 1045

## 2012-08-01 NOTE — Telephone Encounter (Signed)
Patient called this am at 5am to report continued nausea and vomiting despite oral anti-emetics.  Also she reports diarrhea.  Plan: report to emergency room for evaluation and possible intravenous medications.

## 2012-08-01 NOTE — ED Notes (Signed)
Pt c/o of n/v/d that started fri. Last chemo tx was Thursday. Unable to keep anything down. Pt report 15-20 emesis and diarrhea. Pt denies any abd pain.

## 2012-08-01 NOTE — ED Notes (Signed)
Pt aware of the need for a urine sample. 

## 2012-08-02 ENCOUNTER — Telehealth: Payer: Self-pay | Admitting: *Deleted

## 2012-08-02 ENCOUNTER — Emergency Department (HOSPITAL_COMMUNITY)
Admission: EM | Admit: 2012-08-02 | Discharge: 2012-08-02 | Disposition: A | Discharge status: Home / Self Care | Payer: 59 | Attending: Emergency Medicine | Admitting: Emergency Medicine

## 2012-08-02 ENCOUNTER — Encounter (HOSPITAL_COMMUNITY): Payer: Self-pay | Admitting: Emergency Medicine

## 2012-08-02 DIAGNOSIS — Z87891 Personal history of nicotine dependence: Secondary | ICD-10-CM | POA: Insufficient documentation

## 2012-08-02 DIAGNOSIS — F329 Major depressive disorder, single episode, unspecified: Secondary | ICD-10-CM | POA: Insufficient documentation

## 2012-08-02 DIAGNOSIS — R630 Anorexia: Secondary | ICD-10-CM | POA: Insufficient documentation

## 2012-08-02 DIAGNOSIS — I10 Essential (primary) hypertension: Secondary | ICD-10-CM | POA: Insufficient documentation

## 2012-08-02 DIAGNOSIS — Z79899 Other long term (current) drug therapy: Secondary | ICD-10-CM | POA: Insufficient documentation

## 2012-08-02 DIAGNOSIS — F3289 Other specified depressive episodes: Secondary | ICD-10-CM | POA: Insufficient documentation

## 2012-08-02 DIAGNOSIS — E039 Hypothyroidism, unspecified: Secondary | ICD-10-CM | POA: Insufficient documentation

## 2012-08-02 DIAGNOSIS — Z853 Personal history of malignant neoplasm of breast: Secondary | ICD-10-CM | POA: Insufficient documentation

## 2012-08-02 DIAGNOSIS — E785 Hyperlipidemia, unspecified: Secondary | ICD-10-CM | POA: Insufficient documentation

## 2012-08-02 DIAGNOSIS — Z9851 Tubal ligation status: Secondary | ICD-10-CM | POA: Insufficient documentation

## 2012-08-02 DIAGNOSIS — Z9089 Acquired absence of other organs: Secondary | ICD-10-CM | POA: Insufficient documentation

## 2012-08-02 DIAGNOSIS — R1084 Generalized abdominal pain: Secondary | ICD-10-CM | POA: Insufficient documentation

## 2012-08-02 DIAGNOSIS — K219 Gastro-esophageal reflux disease without esophagitis: Secondary | ICD-10-CM | POA: Insufficient documentation

## 2012-08-02 DIAGNOSIS — G4733 Obstructive sleep apnea (adult) (pediatric): Secondary | ICD-10-CM | POA: Insufficient documentation

## 2012-08-02 DIAGNOSIS — Z8719 Personal history of other diseases of the digestive system: Secondary | ICD-10-CM | POA: Insufficient documentation

## 2012-08-02 DIAGNOSIS — R110 Nausea: Secondary | ICD-10-CM | POA: Insufficient documentation

## 2012-08-02 DIAGNOSIS — Z7982 Long term (current) use of aspirin: Secondary | ICD-10-CM | POA: Insufficient documentation

## 2012-08-02 DIAGNOSIS — E119 Type 2 diabetes mellitus without complications: Secondary | ICD-10-CM | POA: Insufficient documentation

## 2012-08-02 DIAGNOSIS — Z990 Dependence on aspirator: Secondary | ICD-10-CM | POA: Insufficient documentation

## 2012-08-02 DIAGNOSIS — R109 Unspecified abdominal pain: Secondary | ICD-10-CM

## 2012-08-02 DIAGNOSIS — Z86718 Personal history of other venous thrombosis and embolism: Secondary | ICD-10-CM | POA: Insufficient documentation

## 2012-08-02 LAB — CBC WITH DIFFERENTIAL/PLATELET
Band Neutrophils: 0 % (ref 0–10)
Basophils Absolute: 0 10*3/uL (ref 0.0–0.1)
Basophils Relative: 0 % (ref 0–1)
Eosinophils Absolute: 0 10*3/uL (ref 0.0–0.7)
Eosinophils Relative: 0 % (ref 0–5)
HCT: 39.9 % (ref 36.0–46.0)
Hemoglobin: 12.9 g/dL (ref 12.0–15.0)
Lymphocytes Relative: 0 % — ABNORMAL LOW (ref 12–46)
Lymphs Abs: 0 10*3/uL — ABNORMAL LOW (ref 0.7–4.0)
MCH: 25.9 pg — ABNORMAL LOW (ref 26.0–34.0)
MCHC: 32.3 g/dL (ref 30.0–36.0)
MCV: 80 fL (ref 78.0–100.0)
Monocytes Absolute: 0 10*3/uL — ABNORMAL LOW (ref 0.1–1.0)
Monocytes Relative: 0 % — ABNORMAL LOW (ref 3–12)
Neutrophils Relative %: 0 % — ABNORMAL LOW (ref 43–77)
Platelets: DECREASED 10*3/uL (ref 150–400)
RBC: 4.99 MIL/uL (ref 3.87–5.11)
RDW: 18 % — ABNORMAL HIGH (ref 11.5–15.5)
WBC: 0.7 10*3/uL — CL (ref 4.0–10.5)
nRBC: 0 /100 WBC

## 2012-08-02 LAB — BASIC METABOLIC PANEL
BUN: 13 mg/dL (ref 6–23)
CO2: 23 mEq/L (ref 19–32)
Calcium: 7.7 mg/dL — ABNORMAL LOW (ref 8.4–10.5)
Chloride: 104 mEq/L (ref 96–112)
Creatinine, Ser: 0.63 mg/dL (ref 0.50–1.10)
GFR calc Af Amer: >90 mL/min (ref >90)
GFR calc non Af Amer: >90 mL/min (ref >90)
Glucose, Bld: 164 mg/dL — ABNORMAL HIGH (ref 70–99)
Potassium: 3 mEq/L — ABNORMAL LOW (ref 3.5–5.1)
Sodium: 139 mEq/L (ref 135–145)

## 2012-08-02 MED ORDER — OXYCODONE-ACETAMINOPHEN 5-325 MG PO TABS: 1.0000 | ORAL_TABLET | ORAL | Status: DC | PRN

## 2012-08-02 MED ORDER — HEPARIN SOD (PORK) LOCK FLUSH 100 UNIT/ML IV SOLN
INTRAVENOUS | Status: AC
  Administered 2012-08-02: 500 [IU]
  Filled 2012-08-02: qty 5

## 2012-08-02 MED ORDER — POTASSIUM CHLORIDE CRYS ER 20 MEQ PO TBCR
40.0000 meq | EXTENDED_RELEASE_TABLET | Freq: Once | ORAL | Status: AC
  Administered 2012-08-02: 40 meq via ORAL
  Filled 2012-08-02: qty 2

## 2012-08-02 MED ORDER — ONDANSETRON 8 MG/NS 50 ML IVPB
8.0000 mg | Freq: Once | INTRAVENOUS | Status: AC
  Administered 2012-08-02: 8 mg via INTRAVENOUS
  Filled 2012-08-02: qty 8

## 2012-08-02 MED ORDER — HYDROMORPHONE HCL PF 1 MG/ML IJ SOLN
1.0000 mg | Freq: Once | INTRAMUSCULAR | Status: AC
  Administered 2012-08-02: 1 mg via INTRAVENOUS
  Filled 2012-08-02: qty 1

## 2012-08-02 MED ORDER — POTASSIUM CHLORIDE 10 MEQ/100ML IV SOLN
10.0000 meq | Freq: Once | INTRAVENOUS | Status: AC
  Administered 2012-08-02: 10 meq via INTRAVENOUS
  Filled 2012-08-02: qty 100

## 2012-08-02 MED ORDER — ONDANSETRON HCL 4 MG PO TABS: 4.0000 mg | ORAL_TABLET | Freq: Four times a day (QID) | ORAL | Status: DC

## 2012-08-02 MED ORDER — SODIUM CHLORIDE 0.9 % IV BOLUS (SEPSIS)
1000.0000 mL | Freq: Once | INTRAVENOUS | Status: AC
  Administered 2012-08-02: 1000 mL via INTRAVENOUS

## 2012-08-02 NOTE — ED Provider Notes (Signed)
History    57 year old female with abdominal pain and nausea and vomiting. Patient was evaluated in emergency room yesterday for similar complaints. She is feeling better and discharge. She is returning today because of return symptoms. Abdominal pain is diffuse and crampy associated with nausea, but no vomiting. Anorexia. No chest pain or shortness of breath. No urinary complaints. No fevers or chills. No diarrhea. Patient has a past history of breast cancer. She is currently undergoing chemotherapy.  CSN: [id number]  Arrival date & time 08/02/12  1044   First MD Initiated Contact with Patient 08/02/12 1049      Chief Complaint  Patient presents with  . Abdominal Pain  . Nausea    (Consider location/radiation/quality/duration/timing/severity/associated sxs/prior treatment) HPI  Past Medical History  Diagnosis Date  . Depression   . DVT (deep venous thrombosis)     hx of on HRT left leg ~2006  . GERD (gastroesophageal reflux disease)   . Hyperlipidemia   . Hypertension   . Hypothyroidism   . PPD positive, treated     rx inh   . OSA on CPAP   . Diabetes mellitus   . 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 Breast cancer     a. 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 Tubal ligation    . Cholecystectomy    . Foot surgery    . Eye surgery    . Cataract extraction    . Percutaneous liver biopsy    . Breast biopsy      left breast  . Mastectomy modified radical  06/08/2012    Procedure: MASTECTOMY MODIFIED RADICAL;  Surgeon: Mariella Saa, MD;  Location: [facility] OR;  Service: General;  Laterality: Right;  . Portacath placement  06/08/2012    Procedure: INSERTION PORT-A-CATH;  Surgeon: Mariella Saa, MD;  Location: [facility] OR;  Service: General;  Laterality: Left;    Family History  Problem Relation  Age of Onset  . Diabetes Mother   . Hypertension Mother   . Arthritis Mother   . Heart disease Mother   . Heart failure Mother   . Other Mother     benign breast mass  . Stroke Father   . Heart disease Father   . Diabetes Paternal Grandmother   . Colon cancer Paternal Grandfather     History  Substance Use Topics  . Smoking status: Former Games developer  . Smokeless tobacco: Never Used  . Alcohol Use: No    OB History   Grav Para Term Preterm Abortions TAB SAB Ect Mult Living   2 2              Review of Systems  All systems reviewed and negative, other than as noted in HPI.   Allergies  Olmesartan medoxomil; Tetracycline hcl; Venlafaxine; and Adhesive  Home Medications   Current Outpatient Rx  Name  Route  Sig  Dispense  Refill  . lidocaine-prilocaine (EMLA) cream   Topical   Apply 1 application topically as needed (for port access).         . LORazepam (ATIVAN) 0.5 MG tablet   Oral   Take 1 mg by mouth 2 (two) times daily as needed (for nausea or sleep). 1 tab up to twice daily PRN anxiety, insomnia and/or nausea         .  PRESCRIPTION MEDICATION      Chemotherapy at [facility]         . aspirin EC 81 MG tablet   Oral   Take 81 mg by mouth every morning.         [name] atorvastatin (LIPITOR) 10 MG tablet   Oral   Take 10 mg by mouth at bedtime.    90 tablet   1     pt would like a 90 day supply   . buPROPion (WELLBUTRIN SR) 150 MG 12 hr tablet   Oral   Take 150 mg by mouth every morning.          [name] dexamethasone (DECADRON) 4 MG tablet   Oral   Take 4 mg by mouth as directed. Take 2 tabs twice daily on day before, day of, and for 1 day following chemo. On the second day following chemo, Take 2 tablets in the morning only.         . diltiazem (CARDIZEM CD) 120 MG 24 hr capsule   Oral   Take 120 mg by mouth every morning.         . escitalopram (LEXAPRO) 10 MG tablet   Oral   Take 10 mg by mouth at bedtime.         [name] levothyroxine (SYNTHROID,  LEVOTHROID) 125 MCG tablet   Oral   Take 125 mcg by mouth daily before breakfast.         . metoprolol succinate (TOPROL XL) 25 MG 24 hr tablet   Oral   Take 3 tablets (75 mg total) by mouth daily. Take with or immediately following a meal.   90 tablet   1   . omeprazole (PRILOSEC) 20 MG capsule   Oral   Take 1 capsule (20 mg total) by mouth 2 (two) times daily.   180 capsule   2   . ondansetron (ZOFRAN ODT) 4 MG disintegrating tablet   Oral   Take 1 tablet (4 mg total) by mouth every 8 (eight) hours as needed for nausea.   20 tablet   0   . prochlorperazine (COMPAZINE) 10 MG tablet   Oral   Take 10 mg by mouth as directed. Take 1 tablet before meals and at bedtime for 3 days after chemo, then 1 tab every 6 hours as needed for nausea         . saccharomyces boulardii (FLORASTOR) 250 MG capsule   Oral   Take 1 capsule (250 mg total) by mouth 2 (two) times daily.   60 capsule   1   . saxagliptin HCl (ONGLYZA) 5 MG TABS tablet   Oral   Take 5 mg by mouth every morning.           BP 159/83  Pulse 119  Temp(Src) 97.9 F (36.6 C) (Oral)  Resp 16  SpO2 97%  Physical Exam  Nursing note and vitals reviewed. Constitutional: She is oriented to person, place, and time. She appears well-developed and well-nourished. No distress.  Laying in bed. Tired appearing, but not toxic.   HENT:  Head: Normocephalic and atraumatic.  Eyes: Conjunctivae are normal. Right eye exhibits no discharge. Left eye exhibits no discharge.  Neck: Neck supple.  Cardiovascular: Normal rate, regular rhythm and normal heart sounds.  Exam reveals no gallop and no friction rub.   No murmur heard. Pulmonary/Chest: Effort normal and breath sounds normal. No respiratory distress.  Abdominal: Soft. She exhibits no distension. There is no tenderness.  Musculoskeletal:  She exhibits no edema and no tenderness.  Neurological: She is alert and oriented to person, place, and time.  Skin: Skin is warm and  dry.  Psychiatric: She has a normal mood and affect. Her behavior is normal. Thought content normal.    ED Course  Procedures (including critical care time)  Labs Reviewed  CBC WITH DIFFERENTIAL - Abnormal; Notable for the following:    WBC 0.7 (*)    MCH 25.9 (*)    RDW 18.0 (*)    Neutrophils Relative 0 (*)    Lymphocytes Relative 0 (*)    Monocytes Relative 0 (*)    Lymphs Abs 0.0 (*)    Monocytes Absolute 0.0 (*)    All other components within normal limits  BASIC METABOLIC PANEL - Abnormal; Notable for the following:    Potassium 3.0 (*)    Glucose, Bld 164 (*)    Calcium 7.7 (*)    All other components within normal limits   No results found.   1. Nausea   2. Abdominal pain   3.  Leukopenia    MDM  56yf with abdominal pain and nausea. Evaluated yesterday for same. Improved symptoms again. Still hypokalemic, although improved from yesterday. Additional K given. Reports almost complete resolution of symptoms again. Pt not taking anything for pain at home. Prescription for percocet provided. Neutropenic most likely related to chemo. Pt is afebrile and doubt infectious etiology. I feel she is safe for discharge at this time. Return precautions discussed with pt and family.         Raeford Razor, MD 08/03/12 [phone]

## 2012-08-02 NOTE — Telephone Encounter (Signed)
PT.'S ABDOMINAL PAIN IS AT A SCALE OF NINE. SHE HAS ALSO HAD NAUSEA, VOMITING, AND DIARRHEA. VERBAL ORDER AND READ BACK TO DR.MAGRINAT- PT. NEEDS TO BE EVALUATED AT THE EMERGENCY ROOM. NOTIFIED PT. SHE VOICES UNDERSTANDING.

## 2012-08-02 NOTE — ED Notes (Signed)
Pt c/o abdominal pain and nauseous that started yesterday. No vomiting. Pt was here yesterday for the same thing.

## 2012-08-03 ENCOUNTER — Other Ambulatory Visit: Payer: 59 | Admitting: Lab

## 2012-08-03 ENCOUNTER — Ambulatory Visit: Payer: 59 | Admitting: Physician Assistant

## 2012-08-03 ENCOUNTER — Inpatient Hospital Stay (HOSPITAL_COMMUNITY): Payer: 59

## 2012-08-03 ENCOUNTER — Inpatient Hospital Stay (HOSPITAL_COMMUNITY)
Admission: AD | Admit: 2012-08-03 | Discharge: 2012-08-17 | DRG: 640 | Disposition: A | Discharge status: Skilled Nursing Facility | Payer: 59 | Source: Ambulatory Visit | Attending: Oncology | Admitting: Oncology

## 2012-08-03 ENCOUNTER — Other Ambulatory Visit: Payer: Self-pay | Admitting: Oncology

## 2012-08-03 ENCOUNTER — Encounter (HOSPITAL_COMMUNITY): Payer: Self-pay

## 2012-08-03 ENCOUNTER — Ambulatory Visit: Payer: 59

## 2012-08-03 ENCOUNTER — Ambulatory Visit: Payer: 59 | Admitting: Oncology

## 2012-08-03 ENCOUNTER — Encounter: Payer: 59 | Admitting: Nurse Practitioner

## 2012-08-03 VITALS — BP 147/81 | HR 120 | Temp 98.3°F | Resp 20

## 2012-08-03 DIAGNOSIS — I10 Essential (primary) hypertension: Secondary | ICD-10-CM

## 2012-08-03 DIAGNOSIS — E41 Nutritional marasmus: Secondary | ICD-10-CM | POA: Diagnosis present

## 2012-08-03 DIAGNOSIS — K219 Gastro-esophageal reflux disease without esophagitis: Secondary | ICD-10-CM

## 2012-08-03 DIAGNOSIS — R109 Unspecified abdominal pain: Secondary | ICD-10-CM

## 2012-08-03 DIAGNOSIS — R161 Splenomegaly, not elsewhere classified: Secondary | ICD-10-CM

## 2012-08-03 DIAGNOSIS — T451X5A Adverse effect of antineoplastic and immunosuppressive drugs, initial encounter: Secondary | ICD-10-CM | POA: Diagnosis present

## 2012-08-03 DIAGNOSIS — K7689 Other specified diseases of liver: Secondary | ICD-10-CM | POA: Diagnosis present

## 2012-08-03 DIAGNOSIS — R5081 Fever presenting with conditions classified elsewhere: Secondary | ICD-10-CM | POA: Diagnosis present

## 2012-08-03 DIAGNOSIS — D709 Neutropenia, unspecified: Secondary | ICD-10-CM | POA: Diagnosis present

## 2012-08-03 DIAGNOSIS — F329 Major depressive disorder, single episode, unspecified: Secondary | ICD-10-CM | POA: Diagnosis present

## 2012-08-03 DIAGNOSIS — E669 Obesity, unspecified: Secondary | ICD-10-CM | POA: Diagnosis present

## 2012-08-03 DIAGNOSIS — R5383 Other fatigue: Secondary | ICD-10-CM

## 2012-08-03 DIAGNOSIS — D509 Iron deficiency anemia, unspecified: Secondary | ICD-10-CM

## 2012-08-03 DIAGNOSIS — I4891 Unspecified atrial fibrillation: Secondary | ICD-10-CM | POA: Diagnosis present

## 2012-08-03 DIAGNOSIS — C50911 Malignant neoplasm of unspecified site of right female breast: Secondary | ICD-10-CM

## 2012-08-03 DIAGNOSIS — E785 Hyperlipidemia, unspecified: Secondary | ICD-10-CM

## 2012-08-03 DIAGNOSIS — C779 Secondary and unspecified malignant neoplasm of lymph node, unspecified: Secondary | ICD-10-CM | POA: Diagnosis present

## 2012-08-03 DIAGNOSIS — G4733 Obstructive sleep apnea (adult) (pediatric): Secondary | ICD-10-CM

## 2012-08-03 DIAGNOSIS — R197 Diarrhea, unspecified: Secondary | ICD-10-CM | POA: Diagnosis present

## 2012-08-03 DIAGNOSIS — E039 Hypothyroidism, unspecified: Secondary | ICD-10-CM | POA: Diagnosis present

## 2012-08-03 DIAGNOSIS — E875 Hyperkalemia: Secondary | ICD-10-CM | POA: Diagnosis present

## 2012-08-03 DIAGNOSIS — F430 Acute stress reaction: Secondary | ICD-10-CM

## 2012-08-03 DIAGNOSIS — E119 Type 2 diabetes mellitus without complications: Secondary | ICD-10-CM | POA: Diagnosis present

## 2012-08-03 DIAGNOSIS — D61810 Antineoplastic chemotherapy induced pancytopenia: Secondary | ICD-10-CM | POA: Diagnosis present

## 2012-08-03 DIAGNOSIS — R112 Nausea with vomiting, unspecified: Secondary | ICD-10-CM | POA: Diagnosis present

## 2012-08-03 DIAGNOSIS — F29 Unspecified psychosis not due to a substance or known physiological condition: Secondary | ICD-10-CM | POA: Diagnosis present

## 2012-08-03 DIAGNOSIS — M129 Arthropathy, unspecified: Secondary | ICD-10-CM

## 2012-08-03 DIAGNOSIS — C50919 Malignant neoplasm of unspecified site of unspecified female breast: Secondary | ICD-10-CM | POA: Diagnosis present

## 2012-08-03 DIAGNOSIS — E876 Hypokalemia: Principal | ICD-10-CM | POA: Diagnosis present

## 2012-08-03 DIAGNOSIS — Z6831 Body mass index (BMI) 31.0-31.9, adult: Secondary | ICD-10-CM

## 2012-08-03 DIAGNOSIS — R5381 Other malaise: Secondary | ICD-10-CM | POA: Diagnosis present

## 2012-08-03 DIAGNOSIS — F3289 Other specified depressive episodes: Secondary | ICD-10-CM | POA: Diagnosis present

## 2012-08-03 DIAGNOSIS — E559 Vitamin D deficiency, unspecified: Secondary | ICD-10-CM

## 2012-08-03 LAB — COMPREHENSIVE METABOLIC PANEL
ALT: 27 U/L (ref 0–35)
Alkaline Phosphatase: 92 U/L (ref 39–117)
CO2: 22 mEq/L (ref 19–32)
GFR calc Af Amer: >90 mL/min (ref >90)
GFR calc non Af Amer: >90 mL/min (ref >90)
Glucose, Bld: 150 mg/dL — ABNORMAL HIGH (ref 70–99)
Potassium: 2.6 mEq/L — CL (ref 3.5–5.1)
Sodium: 129 mEq/L — ABNORMAL LOW (ref 135–145)
Total Bilirubin: 1.2 mg/dL (ref 0.3–1.2)

## 2012-08-03 LAB — CBC WITH DIFFERENTIAL/PLATELET
Basophils Relative: 0 % (ref 0–1)
HCT: 35.8 % — ABNORMAL LOW (ref 36.0–46.0)
Hemoglobin: 11.8 g/dL — ABNORMAL LOW (ref 12.0–15.0)
Lymphocytes Relative: 60 % — ABNORMAL HIGH (ref 12–46)
Lymphs Abs: 0.2 10*3/uL — ABNORMAL LOW (ref 0.7–4.0)
MCHC: 33 g/dL (ref 30.0–36.0)
MCV: 76.7 fL — ABNORMAL LOW (ref 78.0–100.0)
Monocytes Relative: 21 % — ABNORMAL HIGH (ref 3–12)
Neutro Abs: 0.1 10*3/uL — ABNORMAL LOW (ref 1.7–7.7)

## 2012-08-03 LAB — GLUCOSE, CAPILLARY: Glucose-Capillary: 136 mg/dL — ABNORMAL HIGH (ref 70–99)

## 2012-08-03 MED ORDER — LIDOCAINE-PRILOCAINE 2.5-2.5 % EX CREA: 1.0000 "application " | TOPICAL_CREAM | CUTANEOUS | Status: DC | PRN

## 2012-08-03 MED ORDER — POTASSIUM PHOSPHATE DIBASIC 3 MMOLE/ML IV SOLN
15.0000 mmol | Freq: Once | INTRAVENOUS | Status: AC
  Administered 2012-08-03: 15 mmol via INTRAVENOUS
  Filled 2012-08-03: qty 5

## 2012-08-03 MED ORDER — DILTIAZEM HCL ER COATED BEADS 120 MG PO CP24
120.0000 mg | ORAL_CAPSULE | Freq: Every morning | ORAL | Status: DC
  Administered 2012-08-05 – 2012-08-06 (×2): 120 mg via ORAL
  Filled 2012-08-03 (×6): qty 1

## 2012-08-03 MED ORDER — LEVOTHYROXINE SODIUM 125 MCG PO TABS
125.0000 ug | ORAL_TABLET | Freq: Every day | ORAL | Status: DC
  Administered 2012-08-05: 125 ug via ORAL
  Filled 2012-08-03 (×4): qty 1

## 2012-08-03 MED ORDER — BUPROPION HCL ER (SR) 150 MG PO TB12
150.0000 mg | ORAL_TABLET | Freq: Every morning | ORAL | Status: DC
  Administered 2012-08-06 – 2012-08-17 (×10): 150 mg via ORAL
  Filled 2012-08-03 (×14): qty 1

## 2012-08-03 MED ORDER — ADULT MULTIVITAMIN W/MINERALS CH
1.0000 | ORAL_TABLET | Freq: Every day | ORAL | Status: DC
  Administered 2012-08-03 – 2012-08-17 (×11): 1 via ORAL
  Filled 2012-08-03 (×15): qty 1

## 2012-08-03 MED ORDER — SODIUM CHLORIDE 0.9 % IV SOLN
INTRAVENOUS | Status: DC
  Administered 2012-08-03: 17:00:00 via INTRAVENOUS

## 2012-08-03 MED ORDER — LINAGLIPTIN 5 MG PO TABS
5.0000 mg | ORAL_TABLET | Freq: Every day | ORAL | Status: DC
  Filled 2012-08-03: qty 1

## 2012-08-03 MED ORDER — SACCHAROMYCES BOULARDII 250 MG PO CAPS
250.0000 mg | ORAL_CAPSULE | Freq: Two times a day (BID) | ORAL | Status: DC
Start: 2012-08-03 — End: 2012-08-17
  Administered 2012-08-03 – 2012-08-17 (×23): 250 mg via ORAL
  Filled 2012-08-03 (×29): qty 1

## 2012-08-03 MED ORDER — ESCITALOPRAM OXALATE 10 MG PO TABS
10.0000 mg | ORAL_TABLET | Freq: Every day | ORAL | Status: DC
  Administered 2012-08-03 – 2012-08-16 (×13): 10 mg via ORAL
  Filled 2012-08-03 (×15): qty 1

## 2012-08-03 MED ORDER — PANTOPRAZOLE SODIUM 40 MG PO TBEC
40.0000 mg | DELAYED_RELEASE_TABLET | Freq: Every day | ORAL | Status: DC
  Administered 2012-08-03: 40 mg via ORAL
  Filled 2012-08-03 (×2): qty 1

## 2012-08-03 MED ORDER — LORAZEPAM 1 MG PO TABS: 1.0000 mg | ORAL_TABLET | Freq: Two times a day (BID) | ORAL | Status: DC | PRN

## 2012-08-03 MED ORDER — ENOXAPARIN SODIUM 40 MG/0.4ML ~~LOC~~ SOLN
40.0000 mg | SUBCUTANEOUS | Status: DC
  Filled 2012-08-03: qty 0.4

## 2012-08-03 MED ORDER — ATORVASTATIN CALCIUM 10 MG PO TABS
10.0000 mg | ORAL_TABLET | Freq: Every day | ORAL | Status: DC
Start: 2012-08-03 — End: 2012-08-17
  Administered 2012-08-03 – 2012-08-16 (×13): 10 mg via ORAL
  Filled 2012-08-03 (×15): qty 1

## 2012-08-03 MED ORDER — INSULIN ASPART 100 UNIT/ML ~~LOC~~ SOLN
0.0000 [IU] | Freq: Three times a day (TID) | SUBCUTANEOUS | Status: DC
  Administered 2012-08-03 – 2012-08-04 (×3): 2 [IU] via SUBCUTANEOUS
  Administered 2012-08-04 – 2012-08-05 (×2): 3 [IU] via SUBCUTANEOUS
  Administered 2012-08-05 (×2): 2 [IU] via SUBCUTANEOUS
  Administered 2012-08-06: 3 [IU] via SUBCUTANEOUS
  Administered 2012-08-06 (×2): 2 [IU] via SUBCUTANEOUS
  Administered 2012-08-07: 3 [IU] via SUBCUTANEOUS
  Administered 2012-08-07 (×2): 2 [IU] via SUBCUTANEOUS
  Administered 2012-08-08: 5 [IU] via SUBCUTANEOUS
  Administered 2012-08-08: 3 [IU] via SUBCUTANEOUS
  Administered 2012-08-08: 2 [IU] via SUBCUTANEOUS
  Administered 2012-08-09 (×3): 3 [IU] via SUBCUTANEOUS
  Administered 2012-08-10: 5 [IU] via SUBCUTANEOUS
  Administered 2012-08-10: 3 [IU] via SUBCUTANEOUS
  Administered 2012-08-10 – 2012-08-11 (×2): 2 [IU] via SUBCUTANEOUS
  Administered 2012-08-11: 3 [IU] via SUBCUTANEOUS
  Administered 2012-08-11 – 2012-08-12 (×3): 2 [IU] via SUBCUTANEOUS
  Administered 2012-08-12: 3 [IU] via SUBCUTANEOUS
  Administered 2012-08-13: 2 [IU] via SUBCUTANEOUS
  Administered 2012-08-13: 3 [IU] via SUBCUTANEOUS

## 2012-08-03 MED ORDER — POTASSIUM CHLORIDE IN NACL 40-0.9 MEQ/L-% IV SOLN
INTRAVENOUS | Status: DC
  Administered 2012-08-03 – 2012-08-06 (×5): via INTRAVENOUS
  Filled 2012-08-03 (×11): qty 1000

## 2012-08-03 MED ORDER — PROCHLORPERAZINE MALEATE 10 MG PO TABS: 10.0000 mg | ORAL_TABLET | Freq: Four times a day (QID) | ORAL | Status: DC | PRN

## 2012-08-03 MED ORDER — ONDANSETRON 4 MG PO TBDP
4.0000 mg | ORAL_TABLET | Freq: Three times a day (TID) | ORAL | Status: DC | PRN
  Administered 2012-08-03: 4 mg via ORAL
  Filled 2012-08-03: qty 1

## 2012-08-03 MED ORDER — ASPIRIN EC 81 MG PO TBEC
81.0000 mg | DELAYED_RELEASE_TABLET | Freq: Every morning | ORAL | Status: DC
  Administered 2012-08-06 – 2012-08-17 (×10): 81 mg via ORAL
  Filled 2012-08-03 (×14): qty 1

## 2012-08-03 MED ORDER — METOPROLOL SUCCINATE ER 50 MG PO TB24
75.0000 mg | ORAL_TABLET | Freq: Every day | ORAL | Status: DC
  Administered 2012-08-03 – 2012-08-05 (×2): 75 mg via ORAL
  Filled 2012-08-03 (×5): qty 1

## 2012-08-03 MED ORDER — MAGNESIUM SULFATE 40 MG/ML IJ SOLN
2.0000 g | Freq: Once | INTRAMUSCULAR | Status: AC
  Administered 2012-08-03: 2 g via INTRAVENOUS
  Filled 2012-08-03: qty 50

## 2012-08-03 MED ORDER — ONDANSETRON HCL 4 MG PO TABS
4.0000 mg | ORAL_TABLET | Freq: Four times a day (QID) | ORAL | Status: DC
  Administered 2012-08-03: 4 mg via ORAL
  Filled 2012-08-03 (×5): qty 1

## 2012-08-03 NOTE — H&P (Signed)
[name] DOB: 1956/01/08 MR#: [id number] JYN#[id number]  PCP: [name]., NP  GYN:  SU: Glenna Fellows  OTHER QM:VHQION Rodena Medin, Burna Mortimer Panosh   HISTORY OF PRESENT ILLNESS:  [name] noted a mass in her right breast mid December, and as it did not spontaneously resolve over a couple of weeks she brought it to her primary physician's attention. She was set up for diagnostic mammography and right breast ultrasonography at the [facility] Center 05/10/2012. (Note that the patient's most recent prior mammography had been in October 2008). The current study showed a spiculated mass in the superior subareolar portion of the right breast measuring approximately 5 cm and associated with pleomorphic calcifications. This was firm and palpable. There was right nipple retraction and skin thickening. Ultrasound confirmed an irregularly marginated hypoechoic mass measuring 3.5 cm by ultrasound, and an abnormal appearing lower right axillary lymph node measuring 2.6 cm.  Biopsies of both the breast mass and the abnormal appearing lymph node were performed 05/21/2012. Both showed an invasive ductal carcinoma, grade 2, with similar prognostic panels (the breast mass was 100% estrogen and 73% progesterone receptor positive, with an MIB-1 of 5%; the lymph node was 100% estrogen 100% progesterone receptor positive, with an MIB-1 of 20%). Both masses were HER-2 negative.  Breast MRI obtained at [facility] 05/29/2012 confirmed a dominant mass in the retroareolar right breast measuring 4.4 cm maximally. There was a satellite nodule inferior and lateral to this mass, measuring 1.7 cm. There were no other masses in either breast. Aside from the previously biopsied lymph node there were other mildly enhancing level I right axillary lymph nodes which did not appear pathologic. There was no other lymphadenopathy noted. The patient's subsequent history is as detailed below.   INTERVAL HISTORY:  Patient was seen in  the ED at [facility] past 2 days with complaints of abdominal pain. Today the patient came to the office by ambulance with the same complaint. She was having severe watery diarrhea and was very dehydrated. Husband was ion despair--he had been working as hard as he could to care for his wife and she was only getting worse.. The patient was admitted for further evaluation and treatment of her severe diarrhea, dehydration, and metabolic abnormalities in the setting of profound neutropenia.  REVIEW OF SYSTEMS:  Datra.complains of dry mouth. Food tastes "terrible." She appeared indifferent and was clearly confused. Husband tells me the pain in her stomach has been fairly constant, that she can't eat, though she has been able to drink some fluids, and that she was having so much diarrhea "we couldn't keep up with it.". They deny fever, rash or bleeding. There has been no nausea or vomiting. She has been bedbound and that is why they called the ambulance for transport today.  PAST MEDICAL HISTORY:  Past Medical History   Diagnosis  Date   .  Depression    .  DVT (deep venous thrombosis)      hx of on HRT left leg ~2006   .  GERD (gastroesophageal reflux disease)    .  Hyperlipidemia    .  Hypertension    .  Hypothyroidism    .  PPD positive, treated      rx inh   .  OSA on CPAP    .  Diabetes mellitus    .  Liver disease, chronic, with cirrhosis      ? autoimmune   .  Breast cancer      a. Right - invasive  ductal carcinoma with 2/18 lymph nodes involved (pT3, pN1a, stage IIIA), s/p R mastectomy 06/08/12, beginning chemotherapy,   PAST SURGICAL HISTORY:  Past Surgical History   Procedure  Laterality  Date   .  Tubal ligation     .  Cholecystectomy     .  Foot surgery     .  Eye surgery     .  Cataract extraction     .  Percutaneous liver biopsy     .  Breast biopsy       left breast   .  Mastectomy modified radical   06/08/2012     Procedure: MASTECTOMY MODIFIED RADICAL; Surgeon: Mariella Saa, MD; Location: [facility] OR; Service: General; Laterality: Right;   .  Portacath placement   06/08/2012     Procedure: INSERTION PORT-A-CATH; Surgeon: Mariella Saa, MD; Location: [facility] OR; Service: General; Laterality: Left;    FAMILY HISTORY  Family History   Problem  Relation  Age of Onset   .  Diabetes  Mother    .  Hypertension  Mother    .  Arthritis  Mother    .  Heart disease  Mother    .  Heart failure  Mother    .  Other  Mother      benign breast mass   .  Stroke  Father    .  Heart disease  Father    .  Diabetes  Paternal Grandmother    .  Colon cancer  Paternal Grandfather    the patient's father died in his 59s with a history of dementia. He had had prior strokes. The patient's mother died in her 59s, with a history of congestive heart failure. [name] had no brothers, one sister. There is no history of breast or ovarian cancer in the family.   GYNECOLOGIC HISTORY:  Menarche age 57, first live birth age 57, she is GX P2, menopause approximately 15 years ago, on hormone replacement until 2010.   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 Her husband [name] works for [employer]. Daughter [name] is a Scientist, forensic and lives in [place]. Daughter [name] and her family (husband and 2 children aged 5 and 1-1/2 years) currently live with the patient. [name] is a member of a [employer].   ADVANCED DIRECTIVES: Not in place   HEALTH MAINTENANCE:  History   Substance Use Topics   .  Smoking status:  Former Games developer   .  Smokeless tobacco:  Never Used   .  Alcohol Use:  No   Colonoscopy: Never  PAP: Does not recall  Bone density: Never  .   Allergies   Allergen  Reactions   .  Olmesartan Medoxomil      REACTION: ? if cough   .  Tetracycline Hcl      REACTION: unspecified   .  Venlafaxine      REACTION: severe dry moouth   .  Adhesive (Tape)  Rash    Current Outpatient  Prescriptions   Medication  Sig  Dispense  Refill   .  ALPRAZolam (XANAX) 0.25 MG tablet  Take 1 tablet (0.25 mg total) by mouth 3 (three) times daily as needed.  30 tablet  0   .  Alum & Mag Hydroxide-Simeth (MAGIC MOUTHWASH) SOLN  Take 5 mLs by mouth 4 (four) times daily.  15 mL  1   .  aspirin  EC 81 MG tablet  Take 81 mg by mouth every morning.     [name]  atorvastatin (LIPITOR) 10 MG tablet  Take 10 mg by mouth at bedtime.  90 tablet  1   .  buPROPion (WELLBUTRIN SR) 150 MG 12 hr tablet  Take 150 mg by mouth every morning.     [name]  dexamethasone (DECADRON) 4 MG tablet  Take 4 mg by mouth. 2 tabs PO BID on day before and 2 days after chemo. 2 tabs PO in am only on day 4.     .  diltiazem (CARDIZEM CD) 120 MG 24 hr capsule  Take 1 capsule (120 mg total) by mouth daily.  30 capsule  0   .  escitalopram (LEXAPRO) 10 MG tablet  Take 10 mg by mouth at bedtime.     [name]  glucose blood (TRUETEST TEST) test strip  Use as instructed  100 each  12   .  levothyroxine (SYNTHROID, LEVOTHROID) 125 MCG tablet  Take 125 mcg by mouth daily before breakfast.     .  lidocaine-prilocaine (EMLA) cream  Apply topically as needed.  30 g  1   .  LORazepam (ATIVAN) 0.5 MG tablet  1 tab up to twice daily PRN anxiety, insomnia and/or nausea  30 tablet  0   .  metoprolol succinate (TOPROL XL) 25 MG 24 hr tablet  Take 3 tablets (75 mg total) by mouth daily. Take with or immediately following a meal.  90 tablet  1   .  omeprazole (PRILOSEC) 20 MG capsule  Take 1 capsule (20 mg total) by mouth 2 (two) times daily.  180 capsule  2   .  prochlorperazine (COMPAZINE) 10 MG tablet  1 tab PO ACHS x 3 days after chemo, then 1 tab PO q 6 hr PRN nausea  30 tablet  2   .  protein supplement (RESOURCE BENEPROTEIN) 6 g POWD  Take 1 scoop (6 g total) by mouth 3 (three) times daily with meals.  30 Can  1   .  saccharomyces boulardii (FLORASTOR) 250 MG capsule  Take 1 capsule (250 mg total) by mouth 2 (two) times daily.  60 capsule  1   .   saxagliptin HCl (ONGLYZA) 5 MG TABS tablet  Take 5 mg by mouth every morning.        Facility-Administered Medications Ordered in Other Visits   Medication  Dose  Route  Frequency  Provider  Last Rate  Last Dose   .  CARBOplatin (PARAPLATIN) 510 mg in sodium chloride 0.9 % 250 mL chemo infusion  510 mg  Intravenous  Once  Amy G Berry, PA-C     .  DOCEtaxel (TAXOTERE) 130 mg in sodium chloride 0.9 % 250 mL chemo infusion  65 mg/m2 (Order-Specific)  Intravenous  Once  Lowella Dell, MD  263 mL/hr at 07/29/12 1148  130 mg at 07/29/12 1148   .  heparin lock flush 100 unit/mL  500 Units  Intracatheter  Once PRN  Amy G Berry, PA-C     .  sodium chloride 0.9 % injection 10 mL  10 mL  Intracatheter  PRN  Amy Allegra Grana, PA-C      OBJECTIVE: Middle-aged white woman examined on a stretcher  Filed Vitals:   . Filed Vitals:   08/03/12 1405  BP: 155/80  Pulse: 124  Temp: 98.1 F (36.7 C)  Resp: 18    HEENT: Sclerae unicteric. Oropharynx clear , dry Nodes:  No cervical or supraclavicular lymphadenopathy.  Breast Exam: Deferred. Axillae are benign bilaterally .  Lungs: Clear to auscultation, auscultated anteriorly.  Heart: Regular rate and rhythm, no murmur appreciated  Abdomen: Soft, +BS, not tender to mild palpation  Musculoskeletal: No peripheral edema Neuro: Nonfocal. Knows place and person. Inappropriately detached affect   LAB RESULTS:  CBC    Component Value Date/Time   WBC 0.4* 08/03/2012 1640   WBC 3.3* 07/28/2012 1145   RBC 4.67 08/03/2012 1640   RBC 4.18 07/28/2012 1145   HGB 11.8* 08/03/2012 1640   HGB 10.6* 07/28/2012 1145   HCT 35.8* 08/03/2012 1640   HCT 33.1* 07/28/2012 1145   PLT 60* 08/03/2012 1640   PLT 177 07/28/2012 1145   MCV 76.7* 08/03/2012 1640   MCV 79.1* 07/28/2012 1145   MCH 25.3* 08/03/2012 1640   MCH 25.4 07/28/2012 1145   MCHC 33.0 08/03/2012 1640   MCHC 32.1 07/28/2012 1145   RDW 17.6* 08/03/2012 1640   RDW 17.8* 07/28/2012 1145   LYMPHSABS 0.2* 08/03/2012 1640    LYMPHSABS 0.7* 07/28/2012 1145   MONOABS 0.1 08/03/2012 1640   MONOABS 0.4 07/28/2012 1145   EOSABS 0.0 08/03/2012 1640   EOSABS 0.2 07/28/2012 1145   BASOSABS 0.0 08/03/2012 1640   BASOSABS 0.0 07/28/2012 1145   BMET    Component Value Date/Time   NA 129* 08/03/2012 1640   NA 146* 07/28/2012 1145   K 2.6* 08/03/2012 1640   K 3.4* 07/28/2012 1145   CL 94* 08/03/2012 1640   CL 108* 07/28/2012 1145   CO2 22 08/03/2012 1640   CO2 29 07/28/2012 1145   GLUCOSE 150* 08/03/2012 1640   GLUCOSE 77 07/28/2012 1145   GLUCOSE 92 03/06/2010   BUN 11 08/03/2012 1640   BUN 5.0* 07/28/2012 1145   CREATININE 0.71 08/03/2012 1640   CREATININE 0.9 07/28/2012 1145   CREATININE 1.30* 07/23/2012 1342   CALCIUM 7.7* 08/03/2012 1640   CALCIUM 8.2* 07/28/2012 1145   GFRNONAA >90 08/03/2012 1640   GFRAA >90 08/03/2012 1640   PHOS 1.3, MAG 1.3   STUDIES: X-ray Chest Pa And Lateral   08/03/2012  *RADIOLOGY REPORT*  Clinical Data: Weakness, CHF, history breast cancer  CHEST - 2 VIEW  Comparison: 07/12/2012  Findings: Stable left subclavian power port catheter with the tip in the lower SVC region.  Normal heart size and vascularity. Postop changes from right mastectomy and axillary lymph node dissection.  Negative for CHF, pneumonia, collapse, consolidation, effusion or pneumothorax.  Trachea is midline.  Slightly lower lung volumes than the prior exam.  IMPRESSION: Stable postoperative findings.  No superimposed acute process   Original Report Authenticated By: Judie Petit. Miles Costain, M.D.    Dg Chest 2 View  07/12/2012  *RADIOLOGY REPORT*  Clinical Data: Fever.  Breast cancer  CHEST - 2 VIEW  Comparison: 06/08/2012  Findings: Lungs are clear without evidence of pneumonia.  No heart failure or effusion.  Negative for mass lesion.  Port-A-Cath tip in the SVC.  IMPRESSION: No acute abnormality.   Original Report Authenticated By: [name], M.D.    Abd 1 View (kub)  08/03/2012  *RADIOLOGY REPORT*  Clinical Data: Weakness, CHF, breast cancer,  pain  ABDOMEN - 1 VIEW  Comparison: 07/05/2012  Findings: Visualized lung bases clear.  Nonobstructive bowel gas pattern.  Prior cholecystectomy evident.  No significant dilatation or ileus.  Degenerative changes of the lower lumbar spine and SI joints. Advance left hip degenerative arthritic changes with sclerosis and subchondral lucency of  the femoral head and also sclerosis of the acetabulum.  No malalignment or definite fracture.  Pelvis appears intact.  Tubal ligation clips noted.  Pelvic calcifications presumably from degenerated fibroids.  IMPRESSION: Nonobstructive bowel gas pattern.  Lower lumbar spine, SI joint and hip degenerative changes, most pronounced involving the left hip as described.  No acute osseous finding.   Original Report Authenticated By: Judie Petit. Miles Costain, M.D.    US Renal  07/16/2012  *RADIOLOGY REPORT*  Clinical Data: Renal failure  RENAL/URINARY TRACT ULTRASOUND COMPLETE  Comparison:  None.  Findings:  Right Kidney:  Measures 14.6 cm.  No mass or hydronephrosis.  Left Kidney:  Measures 14.2 cm.  No mass or hydronephrosis.  Bladder:  Within normal limits.  Additional comments: Pelvic ascites.  IMPRESSION: Negative renal ultrasound.   Original Report Authenticated By: [name], M.D.    Nm Pet Image Initial (pi) Skull Base To Thigh  07/05/2012  *RADIOLOGY REPORT*  Clinical Data: Initial treatment strategy for right breast carcinoma.  NUCLEAR MEDICINE PET SKULL BASE TO THIGH  Fasting Blood Glucose:  100  Technique:  18.8 mCi F-18 FDG was injected intravenously. CT data was obtained and used for attenuation correction and anatomic localization only.  (This was not acquired as a diagnostic CT examination.) Additional exam technical data entered on technologist worksheet.  Comparison:  None  Findings:  Neck: No hypermetabolic lymph nodes in the neck.  Chest:  No hypermetabolic mediastinal or hilar nodes.  No hypermetabolic axillary lymph nodes identified.  Muscular activity noted in left  shoulder girdle.  No suspicious pulmonary nodules on the CT scan.  Abdomen/Pelvis:  No abnormal hypermetabolic activity within the liver, pancreas, adrenal glands, or spleen.  No hypermetabolic lymph nodes in the abdomen or pelvis.  Incidental findings noted on noncontrast CT or hepatic cirrhosis and mild splenomegaly, consistent with portal venous hypertension.  No evidence of ascites.  Several small uterine fibroids are also noted, at least one showing metabolic activity.  Skeleton:  No focal hypermetabolic activity to suggest skeletal metastasis.  IMPRESSION:  1.  No metabolically active malignancy identified. 2.  Incidental findings include hepatic cirrhosis, mild splenomegaly, and small uterine fibroids.   Original Report Authenticated By: Myles Rosenthal, M.D.      ASSESSMENT: 57 y.o. [name] currently day 6, cycle 2 chemotherapy and day 5 neulasta, admitted with severe diarrhea, dehydration, multiple electrolyte abnormalities and severe neutropenia  (1) status post right mastectomy under the care of Dr. 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 Lymphovascular and appearing neural invasion was identified. 2 of 18 lymph nodes were involved. Margins were clear. Pathologic staging pT3, pN1a, stage IIIA. (Note the original clinical staging was IIB.)  (2) being treated in the adjuvant setting with docetaxel/cyclophosphamide given every 3 weeks, with Neulasta on day 2 for granulocyte support. First treatment was given on 07/06/2012. The original goal was to complete a total of 6 cycles, but due to in tolerance, this has been decreased to 4 cycles, and we have also decreased her doses by 15% beginning with cycle 2, given 07/29/2012.  (3) patient will need postmastectomy radiation, which will be followed by antiestrogen therapy.  (4) comorbidities include  diabetes, hypertension, and chronic liver disease with cirrhosis and fatty liver.   PLAN:  We are admitting the patient for correction of dehydration and the multiple electrolyte abnormalities, due to  her severe diarrhea. We are starting flagyl empirically pending C diff PCR results. In addition to fluids the patient is receiving K, Mg and PO4 replacement.  I am holding her oral hypoglycemics as she is not eating and will be on clear liquids for now. Have written a SSI. Have discussed the situation with her husband. Patient remains a full code.

## 2012-08-03 NOTE — Progress Notes (Addendum)
Pt placed on CPAP with a setting of 4cmH2O per home equipment. Patient seems to be tolerating well. Will continue to monitor!! Patient brought home mask and tubing !!!

## 2012-08-03 NOTE — Progress Notes (Addendum)
Lab called critical value on WBC's at 0.4, which is consistent with previous value of 0.7 on 08/02/12.

## 2012-08-03 NOTE — Progress Notes (Signed)
CMET results called to Dr. Myrle Sheng, with orders received to supplement potassium, magnesium, and phosphorus, and recheck in the morning.  Philomena Doheny RN

## 2012-08-04 ENCOUNTER — Ambulatory Visit: Payer: 59 | Admitting: Physician Assistant

## 2012-08-04 ENCOUNTER — Ambulatory Visit: Payer: 59

## 2012-08-04 ENCOUNTER — Other Ambulatory Visit: Payer: 59 | Admitting: Lab

## 2012-08-04 DIAGNOSIS — D709 Neutropenia, unspecified: Secondary | ICD-10-CM

## 2012-08-04 DIAGNOSIS — D72819 Decreased white blood cell count, unspecified: Secondary | ICD-10-CM

## 2012-08-04 LAB — COMPREHENSIVE METABOLIC PANEL
ALT: 22 U/L (ref 0–35)
AST: 18 U/L (ref 0–37)
Alkaline Phosphatase: 86 U/L (ref 39–117)
BUN: 10 mg/dL (ref 6–23)
CO2: 21 mEq/L (ref 19–32)
CO2: 20 mEq/L (ref 19–32)
Calcium: 7.7 mg/dL — ABNORMAL LOW (ref 8.4–10.5)
Calcium: 7.7 mg/dL — ABNORMAL LOW (ref 8.4–10.5)
Creatinine, Ser: 0.66 mg/dL (ref 0.50–1.10)
GFR calc Af Amer: >90 mL/min (ref >90)
GFR calc Af Amer: >90 mL/min (ref >90)
GFR calc non Af Amer: >90 mL/min (ref >90)
GFR calc non Af Amer: >90 mL/min (ref >90)
Glucose, Bld: 164 mg/dL — ABNORMAL HIGH (ref 70–99)
Glucose, Bld: 138 mg/dL — ABNORMAL HIGH (ref 70–99)
Potassium: 2.9 mEq/L — ABNORMAL LOW (ref 3.5–5.1)
Sodium: 132 mEq/L — ABNORMAL LOW (ref 135–145)

## 2012-08-04 LAB — CBC
Hemoglobin: 11.6 g/dL — ABNORMAL LOW (ref 12.0–15.0)
MCH: 25.6 pg — ABNORMAL LOW (ref 26.0–34.0)
Platelets: 53 10*3/uL — ABNORMAL LOW (ref 150–400)
RBC: 4.53 MIL/uL (ref 3.87–5.11)
WBC: 0.4 10*3/uL — CL (ref 4.0–10.5)

## 2012-08-04 LAB — GLUCOSE, CAPILLARY
Glucose-Capillary: 141 mg/dL — ABNORMAL HIGH (ref 70–99)
Glucose-Capillary: 179 mg/dL — ABNORMAL HIGH (ref 70–99)

## 2012-08-04 LAB — MAGNESIUM: Magnesium: 1.9 mg/dL (ref 1.5–2.5)

## 2012-08-04 LAB — PHOSPHORUS: Phosphorus: 1.6 mg/dL — ABNORMAL LOW (ref 2.3–4.6)

## 2012-08-04 LAB — TSH: TSH: 0.666 u[IU]/mL (ref 0.350–4.500)

## 2012-08-04 MED ORDER — PANTOPRAZOLE SODIUM 40 MG IV SOLR
40.0000 mg | Freq: Every day | INTRAVENOUS | Status: DC
  Administered 2012-08-04 – 2012-08-12 (×9): 40 mg via INTRAVENOUS
  Filled 2012-08-04 (×10): qty 40

## 2012-08-04 MED ORDER — POTASSIUM CHLORIDE 10 MEQ/100ML IV SOLN
10.0000 meq | INTRAVENOUS | Status: AC
  Administered 2012-08-04 (×3): 10 meq via INTRAVENOUS
  Filled 2012-08-04 (×3): qty 100

## 2012-08-04 MED ORDER — BIOTENE DRY MOUTH MT LIQD
15.0000 mL | Freq: Two times a day (BID) | OROMUCOSAL | Status: DC
  Administered 2012-08-04 – 2012-08-17 (×18): 15 mL via OROMUCOSAL

## 2012-08-04 MED ORDER — PROCHLORPERAZINE EDISYLATE 5 MG/ML IJ SOLN
10.0000 mg | Freq: Four times a day (QID) | INTRAMUSCULAR | Status: DC | PRN
  Administered 2012-08-04: 10 mg via INTRAVENOUS
  Filled 2012-08-04 (×2): qty 2

## 2012-08-04 MED ORDER — POTASSIUM PHOSPHATE DIBASIC 3 MMOLE/ML IV SOLN
15.0000 mmol | Freq: Once | INTRAVENOUS | Status: AC
  Administered 2012-08-04: 15 mmol via INTRAVENOUS
  Filled 2012-08-04: qty 5

## 2012-08-04 MED ORDER — PROCHLORPERAZINE EDISYLATE 5 MG/ML IJ SOLN
5.0000 mg | Freq: Four times a day (QID) | INTRAMUSCULAR | Status: DC
  Administered 2012-08-04 – 2012-08-13 (×33): 5 mg via INTRAVENOUS
  Filled 2012-08-04 (×20): qty 1
  Filled 2012-08-04: qty 2
  Filled 2012-08-04 (×2): qty 1
  Filled 2012-08-04 (×2): qty 2
  Filled 2012-08-04 (×9): qty 1
  Filled 2012-08-04: qty 2
  Filled 2012-08-04 (×5): qty 1

## 2012-08-04 MED ORDER — METRONIDAZOLE IN NACL 5-0.79 MG/ML-% IV SOLN
500.0000 mg | Freq: Three times a day (TID) | INTRAVENOUS | Status: DC
  Administered 2012-08-04 – 2012-08-05 (×3): 500 mg via INTRAVENOUS
  Filled 2012-08-04 (×4): qty 100

## 2012-08-04 MED ORDER — LORAZEPAM 2 MG/ML IJ SOLN
0.5000 mg | Freq: Four times a day (QID) | INTRAMUSCULAR | Status: DC | PRN
  Administered 2012-08-04 – 2012-08-05 (×2): 0.5 mg via INTRAVENOUS
  Filled 2012-08-04 (×2): qty 1

## 2012-08-04 MED ORDER — OCTREOTIDE ACETATE 50 MCG/ML IJ SOLN
100.0000 ug | Freq: Two times a day (BID) | INTRAMUSCULAR | Status: DC
  Administered 2012-08-04 – 2012-08-05 (×3): 100 ug via SUBCUTANEOUS
  Filled 2012-08-04 (×4): qty 2

## 2012-08-04 MED ORDER — LORAZEPAM 2 MG/ML IJ SOLN
INTRAMUSCULAR | Status: AC
  Administered 2012-08-04: 0.5 mg
  Filled 2012-08-04: qty 1

## 2012-08-04 MED ORDER — ONDANSETRON 8 MG/NS 50 ML IVPB
8.0000 mg | Freq: Three times a day (TID) | INTRAVENOUS | Status: DC | PRN
  Administered 2012-08-04: 8 mg via INTRAVENOUS
  Filled 2012-08-04: qty 8

## 2012-08-04 NOTE — Progress Notes (Signed)
Patient has been having severe nausea and vomiting/ diarrhea throughout the night.  She vomited all of her nighttime medications up.  I paged Dr. Truett Perna three separate times throughout the night and received orders to place a flexiseal, and orders for IV nausea medications (b/c patient unable to tolerate pills).  Zofran IV did not seem to help but now patient is resting well with ativan 0.5mg  IV.  Patient also is doing well with the flexiseal, although she is putting out a lot of output (first bag emptied at 400ccs).  Also patient vomited all over her PAC dressing. Patient was cleaned up and a sterile PAC dressing change was performed.  Will continue to monitor patient and will call with any further needs/requests! Thanks, .[name]

## 2012-08-04 NOTE — Progress Notes (Signed)
[name] is active with [employer] [facility] [facility] program and is followed by one of the [facility] outpatient Pharmacists for her diabetes. 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 [name] was sleeping soundly and barely awoke to her name being called. Will come back to visit at a more appropriate time. Left contact information at bedside.  Raiford Noble, MSN-Ed, RN,BSN, [facility], [phone]

## 2012-08-04 NOTE — Progress Notes (Signed)
Placed pt on cpap for rest.  RN notified.  Cpap 4cm h2o per home settings.  Pt is using her nasal pillows and tubing from home and tolerating well at this time.  HR84, sats97%.

## 2012-08-04 NOTE — Progress Notes (Signed)
BREANDA GREENLAW   DOB:07/21/1955   NF#:[id number]   QIO#:[id number]  Subjective: Fabiana tells me last night was "all right." Her only complaint is dry mouth. Husband spent the night in room and he tells me (confirmed by RN) patient vomited several times, had constant diarrhea requiring a flexiseal, and remains confused. Patient denies pain, cough/phlegm/SOB, headache or visual changes. Husband in room   Objective: middle aged white woman examined in bed Filed Vitals:   08/04/12 0640  BP:   Pulse: 111  Temp:   Resp:     Body mass index is 31.05 kg/(m^2).  Intake/Output Summary (Last 24 hours) at 08/04/12 0814 Last data filed at 08/04/12 0600  Gross per 24 hour  Intake 1649.16 ml  Output      0 ml  Net 1649.16 ml     Sclerae unicteric  Oropharynx clear, dry  No peripheral adenopathy  Lungs clear -- auscultated anteriorly  Heart regular rate and rhythm  Abdomen soft, slightly distended, NT, no BS  MSK minimal bilateral ankle edema  Neuro nonfocal, oriented to person  Breast exam: deferred  CBG (last 3)   Recent Labs  08/03/12 1737 08/03/12 2130  GLUCAP 140* 136*     Labs:  Lab Results  Component Value Date   WBC 0.4* 08/04/2012   HGB 11.6* 08/04/2012   HCT 34.4* 08/04/2012   MCV 75.9* 08/04/2012   PLT 53* 08/04/2012   NEUTROABS 0.1* 08/03/2012    @LASTCHEMISTRY @  Urine Studies No results found for this basename: UACOL, UAPR, USPG, UPH, UTP, UGL, UKET, UBIL, UHGB, UNIT, UROB, ULEU, UEPI, UWBC, URBC, UBAC, CAST, CRYS, UCOM, BILUA,  in the last 72 hours  Basic Metabolic Panel:  Recent Labs Lab 07/28/12 1145  08/01/12 0637 08/02/12 1128 08/03/12 1640 08/04/12 0500  NA 146*  --  141 139 129* 132*  K 3.4*  < > 2.8* 3.0* 2.6* 2.9*  CL 108*  --  105 104 94* 97  CO2 29  --  25 23 22 21   GLUCOSE 77  --  141* 164* 150* 164*  BUN 5.0*  --  18 13 11 9   CREATININE 0.9  --  0.66 0.63 0.71 0.63  CALCIUM 8.2*  --  7.9* 7.7* 7.7* 7.7*  MG  --   --   --   --  1.3*  --    PHOS  --   --   --   --  1.3*  --   < > = values in this interval not displayed. GFR Estimated Creatinine Clearance: 79.5 ml/min (by C-G formula based on Cr of 0.63). Liver Function Tests:  Recent Labs Lab 07/28/12 1145 08/01/12 0637 08/03/12 1640 08/04/12 0500  AST 72* 81* 21 18  ALT 25 55* 27 22  ALKPHOS 141 127* 92 86  BILITOT 0.58 0.6 1.2 1.0  PROT 6.3* 5.8* 5.3* 4.9*  ALBUMIN 2.6* 2.7* 2.2* 2.1*    Recent Labs Lab 08/01/12 0637  LIPASE 19   No results found for this basename: AMMONIA,  in the last 168 hours Coagulation profile  Recent Labs Lab 08/01/12 0750  INR 1.73*    CBC:  Recent Labs Lab 07/28/12 1145 08/01/12 0637 08/02/12 1128 08/03/12 1640 08/04/12 0500  WBC 3.3* 4.0 0.7* 0.4* 0.4*  NEUTROABS 2.0 3.2  --  0.1*  --   HGB 10.6* 12.5 12.9 11.8* 11.6*  HCT 33.1* 38.7 39.9 35.8* 34.4*  MCV 79.1* 80.0 80.0 76.7* 75.9*  PLT 177 124* PLATELET CLUMPS NOTED ON  SMEAR, COUNT APPEARS DECREASED 60* 53*   Cardiac Enzymes: No results found for this basename: CKTOTAL, CKMB, CKMBINDEX, TROPONINI,  in the last 168 hours BNP: No components found with this basename: POCBNP,  CBG:  Recent Labs Lab 08/03/12 1737 08/03/12 2130  GLUCAP 140* 136*   D-Dimer No results found for this basename: DDIMER,  in the last 72 hours Hgb A1c No results found for this basename: HGBA1C,  in the last 72 hours Lipid Profile No results found for this basename: CHOL, HDL, LDLCALC, TRIG, CHOLHDL, LDLDIRECT,  in the last 72 hours Thyroid function studies No results found for this basename: TSH, T4TOTAL, FREET3, T3FREE, THYROIDAB,  in the last 72 hours Anemia work up No results found for this basename: VITAMINB12, FOLATE, FERRITIN, TIBC, IRON, RETICCTPCT,  in the last 72 hours Microbiology No results found for this or any previous visit (from the past 240 hour(s)).    Studies:  X-ray Chest Pa And Lateral   08/03/2012  *RADIOLOGY REPORT*  Clinical Data: Weakness, CHF,  history breast cancer  CHEST - 2 VIEW  Comparison: 07/12/2012  Findings: Stable left subclavian power port catheter with the tip in the lower SVC region.  Normal heart size and vascularity. Postop changes from right mastectomy and axillary lymph node dissection.  Negative for CHF, pneumonia, collapse, consolidation, effusion or pneumothorax.  Trachea is midline.  Slightly lower lung volumes than the prior exam.  IMPRESSION: Stable postoperative findings.  No superimposed acute process   Original Report Authenticated By: Judie Petit. Miles Costain, M.D.    Abd 1 View (kub)  08/03/2012  *RADIOLOGY REPORT*  Clinical Data: Weakness, CHF, breast cancer, pain  ABDOMEN - 1 VIEW  Comparison: 07/05/2012  Findings: Visualized lung bases clear.  Nonobstructive bowel gas pattern.  Prior cholecystectomy evident.  No significant dilatation or ileus.  Degenerative changes of the lower lumbar spine and SI joints. Advance left hip degenerative arthritic changes with sclerosis and subchondral lucency of the femoral head and also sclerosis of the acetabulum.  No malalignment or definite fracture.  Pelvis appears intact.  Tubal ligation clips noted.  Pelvic calcifications presumably from degenerated fibroids.  IMPRESSION: Nonobstructive bowel gas pattern.  Lower lumbar spine, SI joint and hip degenerative changes, most pronounced involving the left hip as described.  No acute osseous finding.   Original Report Authenticated By: Judie Petit. Miles Costain, M.D.     Assessment: 57 y.o. [name] currently day 6, cycle 2 chemotherapy and day 5 neulasta, admitted with severe diarrhea, vomiting, dehydration, multiple electrolyte abnormalities, confusion and severe neutropenia  (1) status post right mastectomy under the care of Dr. 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 Lymphovascular and appearing neural invasion  was identified. 2 of 18 lymph nodes were involved. Margins were clear. Pathologic staging pT3, pN1a, stage IIIA. (Note the original clinical staging was IIB.)  (2) being treated in the adjuvant setting with docetaxel/cyclophosphamide given every 3 weeks, with Neulasta on day 2 for granulocyte support. First treatment was given on 07/06/2012. The original goal was to complete a total of 6 cycles, but due to in tolerance, this has been decreased to 4 cycles, and we have also decreased her doses by 15% beginning with cycle 2, given 07/29/2012.  (3) patient will need postmastectomy radiation, which will be followed by antiestrogen therapy.  (4) comorbidities include diabetes, hypertension, and chronic liver disease with  cirrhosis and fatty liver.      Plan: we will continue to hydrate aggressively and correct electrolytes; will change flagyl to IV since pt unable to take po's well; so far no fever despite the severe leukopenia; will add octreotide, c diff PCR is pending. Discussed with husband. Full code   Lowella Dell 08/04/2012

## 2012-08-05 ENCOUNTER — Encounter: Payer: 59 | Admitting: Nutrition

## 2012-08-05 LAB — URINALYSIS, ROUTINE W REFLEX MICROSCOPIC
Bilirubin Urine: NEGATIVE
Leukocytes, UA: NEGATIVE
Nitrite: NEGATIVE
Specific Gravity, Urine: 1.021 (ref 1.005–1.030)
Urobilinogen, UA: 0.2 mg/dL (ref 0.0–1.0)
pH: 6 (ref 5.0–8.0)

## 2012-08-05 LAB — CBC WITH DIFFERENTIAL/PLATELET
Basophils Relative: 0 % (ref 0–1)
Eosinophils Absolute: 0 10*3/uL (ref 0.0–0.7)
Eosinophils Relative: 0 % (ref 0–5)
HCT: 32.3 % — ABNORMAL LOW (ref 36.0–46.0)
Hemoglobin: 11 g/dL — ABNORMAL LOW (ref 12.0–15.0)
Lymphocytes Relative: 26 % (ref 12–46)
MCH: 26 pg (ref 26.0–34.0)
MCHC: 34.1 g/dL (ref 30.0–36.0)
Monocytes Absolute: 0.1 10*3/uL (ref 0.1–1.0)
Neutro Abs: 0.6 10*3/uL — ABNORMAL LOW (ref 1.7–7.7)
RBC: 4.23 MIL/uL (ref 3.87–5.11)

## 2012-08-05 LAB — COMPREHENSIVE METABOLIC PANEL
ALT: 17 U/L (ref 0–35)
ALT: 18 U/L (ref 0–35)
AST: 15 U/L (ref 0–37)
AST: 17 U/L (ref 0–37)
Albumin: 1.7 g/dL — ABNORMAL LOW (ref 3.5–5.2)
Alkaline Phosphatase: 93 U/L (ref 39–117)
CO2: 19 mEq/L (ref 19–32)
CO2: 17 mEq/L — ABNORMAL LOW (ref 19–32)
Calcium: 7.6 mg/dL — ABNORMAL LOW (ref 8.4–10.5)
Chloride: 104 mEq/L (ref 96–112)
Chloride: 106 mEq/L (ref 96–112)
Creatinine, Ser: 0.53 mg/dL (ref 0.50–1.10)
GFR calc Af Amer: >90 mL/min (ref >90)
GFR calc non Af Amer: >90 mL/min (ref >90)
GFR calc non Af Amer: >90 mL/min (ref >90)
Glucose, Bld: 151 mg/dL — ABNORMAL HIGH (ref 70–99)
Potassium: 4.7 mEq/L (ref 3.5–5.1)
Sodium: 134 mEq/L — ABNORMAL LOW (ref 135–145)
Sodium: 135 mEq/L (ref 135–145)
Total Bilirubin: 1 mg/dL (ref 0.3–1.2)
Total Bilirubin: 0.9 mg/dL (ref 0.3–1.2)

## 2012-08-05 LAB — URINE MICROSCOPIC-ADD ON

## 2012-08-05 MED ORDER — OCTREOTIDE ACETATE 100 MCG/ML IJ SOLN
100.0000 ug | Freq: Two times a day (BID) | INTRAMUSCULAR | Status: DC
  Administered 2012-08-05 – 2012-08-10 (×11): 100 ug via SUBCUTANEOUS
  Filled 2012-08-05 (×17): qty 1

## 2012-08-05 MED ORDER — ACETAMINOPHEN 325 MG PO TABS
ORAL_TABLET | ORAL | Status: AC
  Administered 2012-08-05: 325 mg
  Filled 2012-08-05: qty 2

## 2012-08-05 MED ORDER — VANCOMYCIN HCL IN DEXTROSE 1-5 GM/200ML-% IV SOLN
1000.0000 mg | Freq: Two times a day (BID) | INTRAVENOUS | Status: DC
  Administered 2012-08-05 – 2012-08-09 (×8): 1000 mg via INTRAVENOUS
  Filled 2012-08-05 (×8): qty 200

## 2012-08-05 MED ORDER — SODIUM CHLORIDE 0.9 % IV SOLN
500.0000 mg | Freq: Three times a day (TID) | INTRAVENOUS | Status: DC
  Administered 2012-08-05 – 2012-08-07 (×6): 500 mg via INTRAVENOUS
  Filled 2012-08-05 (×7): qty 500

## 2012-08-05 MED ORDER — POTASSIUM PHOSPHATE DIBASIC 3 MMOLE/ML IV SOLN
15.0000 mmol | Freq: Once | INTRAVENOUS | Status: AC
  Administered 2012-08-05: 15 mmol via INTRAVENOUS
  Filled 2012-08-05: qty 5

## 2012-08-05 NOTE — Progress Notes (Signed)
Patient and spouse were in room. Patient is minimally responsive today. I could tell she really doesn't feel well. Her spouse is tearful and anxious. He kept saying, "I just want her to get better." It appears that he copes by being able to do things for his wife. He kept talking about what he wanted to do for her and the food he wants to get her. They have been married 33 years. The patient worked at [employer]. The family is [place]. We will continue to support the patient and spouse. Listening; presence; prayer.

## 2012-08-05 NOTE — Progress Notes (Signed)
Spoke with Mr Coye at bedside as Mrs Lorah is resting soundly and reportedly confused at times. Mrs Gunkel is active with the [employer] program for [employer] employees with [employer]. Mr Bellizzi reports patient has become more confused and he is understandably upset about it. Asked if he wanted to speak with Chaplain services. He reports he speaks with his pastor on a daily basis and he pleasantly declined at this time. Offered emotional support to Mr Fern and offered a supply list that [facility] services offers at discounted price such as adult undergarments and boost supplement (if she discharges on that). Mr Gillean interested and this Clinical research associate will assist as needed with supplies if patient and husband so desire. Will continue to follow.  Raiford Noble, MSN-Ed, RN,BSN, [facility], [phone]

## 2012-08-05 NOTE — Progress Notes (Signed)
Temp of 101.2. Apical HR=120's. EKG= AF w/rapid ventricular response. Dr.Magrinat page w/call back & notified of condition. Orders received.Hartley Barefoot

## 2012-08-05 NOTE — Progress Notes (Signed)
INITIAL NUTRITION ASSESSMENT  Pt meets criteria for severe MALNUTRITION in the context of acute illness as evidenced by <50% estimated energy intake in the past week with 8% weight loss in the past 3 weeks.  DOCUMENTATION CODES Per approved criteria  -Severe malnutrition in the context of acute illness or injury   INTERVENTION: - Due to the severity of pt's malnutrition (15 pounds in the past 3 weeks), recommend MD discuss enteral nutrition of elemental TF formula with pt/family. If pt unable to tolerate TF, then recommend TPN.  - Multivitamin 1 tablet PO daily - Spiritual consult for emotional/spiritual support for husband - husband very emotional during RD visit  - Will continue to monitor   NUTRITION DIAGNOSIS: Altered GI function related to nausea, vomiting, diarrhea as evidenced by MD notes.   Goal: 1. Resolution of nausea, vomiting, diarrhea 2. Advance diet as tolerated to low fiber, bland diet   Monitor:  Weights, labs, nausea, vomiting, diarrhea, diet advancement   Reason for Assessment: Nutrition risk   56 y.o. female  Admitting Dx: Dehydration  ASSESSMENT: Pt with stage IIIA breast CA being treated in the adjuvant setting with docetaxel/cyclophosphamide given every 3 weeks with first treatment given 07/06/12 and cycle 2 given 07/29/12. Pt known to RD from previous admission earlier this month. Pt's weight down from 190 pounds on 07/12/12 to 175 pounds. Pt has had severe diarrhea since the beginning of this month. Pt asleep during visit, husband at bedside. He reports since discharge until now pt has not been eating, just trying to stay hydrated at home but even fluids were causing pt to vomit. Husband reports PTA pt was vomiting rapidly and having severe diarrhea and stated that pt was very weak. Husband reports pt's vomiting has improved tremendously since admission however pt still with diarrhea. Pt with 2.3 L stool output from flexiseal yesterday.   Height: Ht Readings  from Last 1 Encounters:  08/03/12 5\' 4"  (1.626 m)    Weight: Wt Readings from Last 1 Encounters:  08/05/12 175 lb 7.8 oz (79.6 kg)    Ideal Body Weight: 120 lb  % Ideal Body Weight: 146  Wt Readings from Last 10 Encounters:  08/05/12 175 lb 7.8 oz (79.6 kg)  07/29/12 185 lb 11.2 oz (84.233 kg)  07/29/12 185 lb 12.8 oz (84.278 kg)  07/23/12 207 lb 1.3 oz (93.931 kg)  07/21/12 195 lb 8.8 oz (88.7 kg)  07/05/12 201 lb 1.9 oz (91.227 kg)  07/01/12 196 lb 6.4 oz (89.086 kg)  06/29/12 197 lb (89.359 kg)  06/04/12 210 lb 11.2 oz (95.573 kg)  06/03/12 208 lb 6.4 oz (94.53 kg)    Usual Body Weight: 198 lb  % Usual Body Weight: 88  BMI:  Body mass index is 30.11 kg/(m^2). Class I obesity  Estimated Nutritional Needs: Kcal: 1400-1650 Protein: 55-65g Fluid: 1.4-1.6L/day  Skin: Non-pitting RLE and LLE edema  Diet Order: Clear Liquid  EDUCATION NEEDS: -No education needs identified at this time   Intake/Output Summary (Last 24 hours) at 08/05/12 1408 Last data filed at 08/05/12 1300  Gross per 24 hour  Intake   3910 ml  Output   4825 ml  Net   -915 ml    Last BM: 3/27, diarrhea with flexiseal in place  Labs:   Recent Labs Lab 08/04/12 0500 08/04/12 1425 08/05/12 0500  NA 132* 134* 134*  K 2.9* 3.5 3.9  CL 97 102 104  CO2 21 20 19   BUN 9 10 9   CREATININE 0.63  0.66 0.60  CALCIUM 7.7* 7.7* 7.6*  MG 1.9 1.8 1.7  PHOS 2.0* 1.6* 1.6*  GLUCOSE 164* 138* 151*    CBG (last 3)   Recent Labs  08/04/12 2128 08/05/12 0801 08/05/12 1215  GLUCAP 179* 152* 144*    Scheduled Meds: . antiseptic oral rinse  15 mL Mouth Rinse BID  . aspirin EC  81 mg Oral q morning - 10a  . atorvastatin  10 mg Oral QHS  . buPROPion  150 mg Oral q morning - 10a  . diltiazem  120 mg Oral q morning - 10a  . escitalopram  10 mg Oral QHS  . insulin aspart  0-15 Units Subcutaneous TID WC  . levothyroxine  125 mcg Oral QAC breakfast  . metoprolol succinate  75 mg Oral Q breakfast   . multivitamin with minerals  1 tablet Oral Daily  . octreotide  100 mcg Subcutaneous Q12H  . pantoprazole (PROTONIX) IV  40 mg Intravenous QHS  . potassium phosphate IVPB (mmol)  15 mmol Intravenous Once  . prochlorperazine  5 mg Intravenous QID  . saccharomyces boulardii  250 mg Oral BID    Continuous Infusions: . 0.9 % NaCl with KCl 40 mEq / L 150 mL/hr at 08/05/12 5784    Past Medical History  Diagnosis Date  . Depression   . DVT (deep venous thrombosis)     hx of on HRT left leg ~2006  . GERD (gastroesophageal reflux disease)   . Hyperlipidemia   . Hypertension   . Hypothyroidism   . PPD positive, treated     rx inh   . OSA on CPAP   . Diabetes mellitus   . 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 Breast cancer     a. 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 Tubal ligation    . Cholecystectomy    . Foot surgery    . Eye surgery    . Cataract extraction    . Percutaneous liver biopsy    . Breast biopsy      left breast  . Mastectomy modified radical  06/08/2012    Procedure: MASTECTOMY MODIFIED RADICAL;  Surgeon: Mariella Saa, MD;  Location: [facility] OR;  Service: General;  Laterality: Right;  . Portacath placement  06/08/2012    Procedure: INSERTION PORT-A-CATH;  Surgeon: Mariella Saa, MD;  Location: [facility] OR;  Service: General;  Laterality: Left;     Levon Hedger MS, RD, LDN [phone] Pager [phone] After Hours Pager

## 2012-08-05 NOTE — Progress Notes (Signed)
[name]   DOB:09/13/1955   ZO#:[id number]   JWJ#:[id number]  Subjective: [name] is very sleepy this AM. Husband tells me she continues to be disoriented ("it's 1974") but knows her SS#. Flexitube came out yesterday, has been replaced. She has urine incontinence at least once. Did sit up in recliner some last night. Husband in room   Objective: 57 year old white woman examined in bed Filed Vitals:   08/05/12 0526  BP: 157/90  Pulse: 94  Temp: 97.9 F (36.6 C)  Resp: 20    Body mass index is 30.11 kg/(m^2).  Intake/Output Summary (Last 24 hours) at 08/05/12 0847 Last data filed at 08/05/12 0500  Gross per 24 hour  Intake   4010 ml  Output   2425 ml  Net   1585 ml     No peripheral adenopathy  Lungs clear -- auscultated anteriorly  Heart regular rate and rhythm  Abdomen soft, slightly distended, NT, dereased BS  MSK minimal bilateral ankle edema  Neuro nonfocal, lethargic  Breast exam: deferred  CBG (last 3)   Recent Labs  08/04/12 1703 08/04/12 2128 08/05/12 0801  GLUCAP 141* 179* 152*     Labs:  Lab Results  Component Value Date   WBC 1.0* 08/05/2012   HGB 11.0* 08/05/2012   HCT 32.3* 08/05/2012   MCV 76.4* 08/05/2012   PLT 50* 08/05/2012   NEUTROABS 0.6* 08/05/2012    @LASTCHEMISTRY @  Urine Studies No results found for this basename: UACOL, UAPR, USPG, UPH, UTP, UGL, UKET, UBIL, UHGB, UNIT, UROB, ULEU, UEPI, UWBC, URBC, UBAC, CAST, CRYS, UCOM, BILUA,  in the last 72 hours  Basic Metabolic Panel:  Recent Labs Lab 08/02/12 1128 08/03/12 1640 08/04/12 0500 08/04/12 1425 08/05/12 0500  NA 139 129* 132* 134* 134*  K 3.0* 2.6* 2.9* 3.5 3.9  CL 104 94* 97 102 104  CO2 23 22 21 20 19   GLUCOSE 164* 150* 164* 138* 151*  BUN 13 11 9 10 9   CREATININE 0.63 0.71 0.63 0.66 0.60  CALCIUM 7.7* 7.7* 7.7* 7.7* 7.6*  MG  --  1.3* 1.9 1.8 1.7  PHOS  --  1.3* 2.0* 1.6* 1.6*   GFR Estimated Creatinine Clearance: 78.3 ml/min (by C-G formula based on Cr of  0.6). Liver Function Tests:  Recent Labs Lab 08/01/12 6213 08/03/12 1640 08/04/12 0500 08/04/12 1425 08/05/12 0500  AST 81* 21 18 18 15   ALT 55* 27 22 21 17   ALKPHOS 127* 92 86 85 83  BILITOT 0.6 1.2 1.0 1.0 1.0  PROT 5.8* 5.3* 4.9* 4.9* 4.9*  ALBUMIN 2.7* 2.2* 2.1* 1.9* 1.8*    Recent Labs Lab 08/01/12 0637  LIPASE 19   No results found for this basename: AMMONIA,  in the last 168 hours Coagulation profile  Recent Labs Lab 08/01/12 0750  INR 1.73*    CBC:  Recent Labs Lab 08/01/12 0637 08/02/12 1128 08/03/12 1640 08/04/12 0500 08/05/12 0500  WBC 4.0 0.7* 0.4* 0.4* 1.0*  NEUTROABS 3.2  --  0.1*  --  0.6*  HGB 12.5 12.9 11.8* 11.6* 11.0*  HCT 38.7 39.9 35.8* 34.4* 32.3*  MCV 80.0 80.0 76.7* 75.9* 76.4*  PLT 124* PLATELET CLUMPS NOTED ON SMEAR, COUNT APPEARS DECREASED 60* 53* 50*   Cardiac Enzymes: No results found for this basename: CKTOTAL, CKMB, CKMBINDEX, TROPONINI,  in the last 168 hours BNP: No components found with this basename: POCBNP,  CBG:  Recent Labs Lab 08/04/12 0904 08/04/12 1131 08/04/12 1703 08/04/12 2128 08/05/12 0801  GLUCAP 158* 148* 141* 179* 152*   D-Dimer No results found for this basename: DDIMER,  in the last 72 hours Hgb A1c No results found for this basename: HGBA1C,  in the last 72 hours Lipid Profile No results found for this basename: CHOL, HDL, LDLCALC, TRIG, CHOLHDL, LDLDIRECT,  in the last 72 hours Thyroid function studies  Recent Labs  08/04/12 1200  TSH 0.666   Anemia work up No results found for this basename: VITAMINB12, FOLATE, FERRITIN, TIBC, IRON, RETICCTPCT,  in the last 72 hours Microbiology Recent Results (from the past 240 hour(s))  CLOSTRIDIUM DIFFICILE BY PCR     Status: None   Collection Time    08/04/12  9:03 AM      Result Value Range Status   C difficile by pcr NEGATIVE  NEGATIVE Final      Studies:  X-ray Chest Pa And Lateral   08/03/2012  *RADIOLOGY REPORT*  Clinical Data:  Weakness, CHF, history breast cancer  CHEST - 2 VIEW  Comparison: 07/12/2012  Findings: Stable left subclavian power port catheter with the tip in the lower SVC region.  Normal heart size and vascularity. Postop changes from right mastectomy and axillary lymph node dissection.  Negative for CHF, pneumonia, collapse, consolidation, effusion or pneumothorax.  Trachea is midline.  Slightly lower lung volumes than the prior exam.  IMPRESSION: Stable postoperative findings.  No superimposed acute process   Original Report Authenticated By: Judie Petit. Miles Costain, M.D.    Abd 1 View (kub)  08/03/2012  *RADIOLOGY REPORT*  Clinical Data: Weakness, CHF, breast cancer, pain  ABDOMEN - 1 VIEW  Comparison: 07/05/2012  Findings: Visualized lung bases clear.  Nonobstructive bowel gas pattern.  Prior cholecystectomy evident.  No significant dilatation or ileus.  Degenerative changes of the lower lumbar spine and SI joints. Advance left hip degenerative arthritic changes with sclerosis and subchondral lucency of the femoral head and also sclerosis of the acetabulum.  No malalignment or definite fracture.  Pelvis appears intact.  Tubal ligation clips noted.  Pelvic calcifications presumably from degenerated fibroids.  IMPRESSION: Nonobstructive bowel gas pattern.  Lower lumbar spine, SI joint and hip degenerative changes, most pronounced involving the left hip as described.  No acute osseous finding.   Original Report Authenticated By: Judie Petit. Miles Costain, M.D.     Assessment: 57 year old [name] woman currently day 6, cycle 2 chemotherapy and day 5 neulasta, admitted with severe diarrhea, vomiting, dehydration, multiple electrolyte abnormalities, confusion and severe neutropenia  (1) status post right mastectomy under the care of Dr. 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 Lymphovascular and appearing  neural invasion was identified. 2 of 18 lymph nodes were involved. Margins were clear. Pathologic staging pT3, pN1a, stage IIIA. (Note the original clinical staging was IIB.)  (2) being treated in the adjuvant setting with docetaxel/cyclophosphamide given every 3 weeks, with Neulasta on day 2 for granulocyte support. First treatment was given on 07/06/2012. The original goal was to complete a total of 6 cycles, but due to in tolerance, this has been decreased to 4 cycles, and we have also decreased her doses by 15% beginning with cycle 2, given 07/29/2012.  (3) patient will need postmastectomy radiation, which will be followed by antiestrogen therapy.  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      Plan: neutrophils are  beginning to recover. PCR for c diff negative-- this is a reaction to chemo and we will not be able to retreat with these agents. We will continue to monitor I/O and electrolytes closely. Anticipate slow improvement over next few days. Will place foley today, will remove as soon as diarrhea slows down. Full code.   Callista Hoh C 08/05/2012

## 2012-08-05 NOTE — Progress Notes (Signed)
MD, patient still continues to appear very confused and lethargic.  She is forgetful of where she is at times and reports that it is 16.  Patient also when given a straw to drink from tries to blow instead of suck on the straw.  Patient did have some periods throughout the night where she was more with it (per husband she was able to say her social security number).  Patient with 2 person assist was able to make it from the bed into the chair last night.  She continues to have loose, black stools with a lot of output in the flexiseal bag and is now incontinent of urine (asked once for the bedpan).  She has only had one episode of vomiting, which was when she was given her pills to swallow.  Patient was able to get them down and then a few minutes later vomited up a large amount of clear liquid fluid.  Again, Ativan helped with this N/V. Will continue to monitor.[name]

## 2012-08-05 NOTE — Progress Notes (Signed)
Husband stated that PT has not been wearing her CPAP the last couple nights. He also stated that he can put it on her if she needs it

## 2012-08-05 NOTE — Progress Notes (Signed)
ANTIBIOTIC CONSULT NOTE - INITIAL  Pharmacy Consult for Vancomycin Indication: fever  Allergies  Allergen Reactions  . Olmesartan Medoxomil Cough    REACTION: ? if cough  . Tetracycline Hcl     Unknown reaction, too long for patient to remember   . Venlafaxine     REACTION: severe dry moouth  . Adhesive (Tape) Rash    Patient Measurements: Height: 5\' 4"  (162.6 cm) Weight: 175 lb 7.8 oz (79.6 kg) IBW/kg (Calculated) : 54.7 Adjusted Body Weight:   Vital Signs: Temp: 101.2 F (38.4 C) (03/27 1509) Temp src: Axillary (03/27 1509) BP: 122/59 mmHg (03/27 1509) Pulse Rate: 165 (03/27 1509) Intake/Output from previous day: 03/26 0701 - 03/27 0700 In: 4010 [P.O.:360; I.V.:3350; IV Piggyback:300] Out: 2425 [Urine:125; Stool:2300] Intake/Output from this shift: Total I/O In: 1671 [I.V.:1417; IV Piggyback:254] Out: 2400 [Urine:1600; Stool:800]  Labs:  Recent Labs  08/03/12 1640 08/04/12 0500 08/04/12 1425 08/05/12 0500  WBC 0.4* 0.4*  --  1.0*  HGB 11.8* 11.6*  --  11.0*  PLT 60* 53*  --  50*  CREATININE 0.71 0.63 0.66 0.60   Estimated Creatinine Clearance: 78.3 ml/min (by C-G formula based on Cr of 0.6). No results found for this basename: VANCOTROUGH, VANCOPEAK, VANCORANDOM, GENTTROUGH, GENTPEAK, GENTRANDOM, TOBRATROUGH, TOBRAPEAK, TOBRARND, AMIKACINPEAK, AMIKACINTROU, AMIKACIN,  in the last 72 hours   Microbiology: Recent Results (from the past 720 hour(s))  CULTURE, BLOOD (ROUTINE X 2)     Status: None   Collection Time    07/12/12 11:45 AM      Result Value Range Status   Specimen Description BLOOD LEFT FOREARM   Final   Special Requests BOTTLES DRAWN AEROBIC AND ANAEROBIC   Final   Culture  Setup Time 07/12/2012 15:29   Final   Culture NO GROWTH 5 DAYS   Final   Report Status 07/18/2012 FINAL   Final  CULTURE, BLOOD (ROUTINE X 2)     Status: None   Collection Time    07/12/12 11:51 AM      Result Value Range Status   Specimen Description BLOOD EFT  HAND   Final   Special Requests BOTTLES DRAWN AEROBIC AND ANAEROBIC   Final   Culture  Setup Time 07/12/2012 15:29   Final   Culture NO GROWTH 5 DAYS   Final   Report Status 07/18/2012 FINAL   Final  CLOSTRIDIUM DIFFICILE BY PCR     Status: None   Collection Time    07/12/12  6:30 PM      Result Value Range Status   C difficile by pcr NEGATIVE  NEGATIVE Final  STOOL CULTURE     Status: None   Collection Time    07/12/12  6:33 PM      Result Value Range Status   Specimen Description STOOL   Final   Special Requests Immunocompromised   Final   Culture     Final   Value: NO SALMONELLA, SHIGELLA, CAMPYLOBACTER, YERSINIA, OR E.COLI 0157:H7 ISOLATED   Report Status 07/16/2012 FINAL   Final  URINE CULTURE     Status: None   Collection Time    07/14/12  8:55 PM      Result Value Range Status   Specimen Description URINE, CLEAN CATCH   Final   Special Requests Immunocompromised   Final   Culture  Setup Time 07/15/2012 02:42   Final   Colony Count NO GROWTH   Final   Culture NO GROWTH   Final  Report Status 07/16/2012 FINAL   Final  CLOSTRIDIUM DIFFICILE BY PCR     Status: None   Collection Time    08/04/12  9:03 AM      Result Value Range Status   C difficile by pcr NEGATIVE  NEGATIVE Final    Medical History: Past Medical History  Diagnosis Date  . Depression   . DVT (deep venous thrombosis)     hx of on HRT left leg ~2006  . GERD (gastroesophageal reflux disease)   . Hyperlipidemia   . Hypertension   . Hypothyroidism   . PPD positive, treated     rx inh   . OSA on CPAP   . Diabetes mellitus   . 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 Breast cancer     a. 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 acetaminophen      . antiseptic oral rinse  15 mL Mouth Rinse BID  . aspirin EC  81 mg Oral q morning - 10a  .  atorvastatin  10 mg Oral QHS  . buPROPion  150 mg Oral q morning - 10a  . diltiazem  120 mg Oral q morning - 10a  . escitalopram  10 mg Oral QHS  . imipenem-cilastatin  500 mg Intravenous Q8H  . insulin aspart  0-15 Units Subcutaneous TID WC  . levothyroxine  125 mcg Oral QAC breakfast  . metoprolol succinate  75 mg Oral Q breakfast  . multivitamin with minerals  1 tablet Oral Daily  . octreotide  100 mcg Subcutaneous Q12H  . pantoprazole (PROTONIX) IV  40 mg Intravenous QHS  . [COMPLETED] potassium phosphate IVPB (mmol)  15 mmol Intravenous Once  . [COMPLETED] potassium phosphate IVPB (mmol)  15 mmol Intravenous Once  . prochlorperazine  5 mg Intravenous QID  . saccharomyces boulardii  250 mg Oral BID  . [DISCONTINUED] metronidazole  500 mg Intravenous Q8H   Infusions:  . 0.9 % NaCl with KCl 40 mEq / L 150 mL/hr at 08/05/12 1501   PRN: lidocaine-prilocaine, ondansetron (ZOFRAN) IV, ondansetron, prochlorperazine, [DISCONTINUED] LORazepam Assessment: 57 yo F with fever. Patient has had a right mastectomy 05/2012 and has received chemotherapy but is intolerant of it. Receiving radiation. Also has DM, HTN    Goal of Therapy:  Vancomycin trough level 15-20 mcg/ml  Plan: Vancomycin 1000mg  IV q 12 hours Measure antibiotic drug levels at steady state Follow up culture results  Loletta Specter 08/05/2012,3:51 PM

## 2012-08-06 DIAGNOSIS — I4891 Unspecified atrial fibrillation: Secondary | ICD-10-CM

## 2012-08-06 DIAGNOSIS — E875 Hyperkalemia: Secondary | ICD-10-CM

## 2012-08-06 DIAGNOSIS — R197 Diarrhea, unspecified: Secondary | ICD-10-CM | POA: Diagnosis present

## 2012-08-06 LAB — COMPREHENSIVE METABOLIC PANEL
BUN: 13 mg/dL (ref 6–23)
CO2: 15 mEq/L — ABNORMAL LOW (ref 19–32)
Chloride: 105 mEq/L (ref 96–112)
Creatinine, Ser: 0.6 mg/dL (ref 0.50–1.10)
GFR calc non Af Amer: >90 mL/min (ref >90)
Glucose, Bld: 203 mg/dL — ABNORMAL HIGH (ref 70–99)
Total Bilirubin: 0.9 mg/dL (ref 0.3–1.2)

## 2012-08-06 LAB — PHOSPHORUS: Phosphorus: 2.1 mg/dL — ABNORMAL LOW (ref 2.3–4.6)

## 2012-08-06 LAB — CBC WITH DIFFERENTIAL/PLATELET
Basophils Relative: 1 % (ref 0–1)
Eosinophils Relative: 0 % (ref 0–5)
Hemoglobin: 11.8 g/dL — ABNORMAL LOW (ref 12.0–15.0)
Lymphs Abs: 0.4 10*3/uL — ABNORMAL LOW (ref 0.7–4.0)
MCH: 25.9 pg — ABNORMAL LOW (ref 26.0–34.0)
MCV: 77.4 fL — ABNORMAL LOW (ref 78.0–100.0)
Monocytes Absolute: 0.1 10*3/uL (ref 0.1–1.0)
Neutro Abs: 1.6 10*3/uL — ABNORMAL LOW (ref 1.7–7.7)
Platelets: 54 10*3/uL — ABNORMAL LOW (ref 150–400)
RBC: 4.55 MIL/uL (ref 3.87–5.11)

## 2012-08-06 LAB — MAGNESIUM: Magnesium: 1.6 mg/dL (ref 1.5–2.5)

## 2012-08-06 LAB — GLUCOSE, CAPILLARY: Glucose-Capillary: 148 mg/dL — ABNORMAL HIGH (ref 70–99)

## 2012-08-06 MED ORDER — SODIUM CHLORIDE 0.9 % IV SOLN
INTRAVENOUS | Status: DC
  Administered 2012-08-06 – 2012-08-16 (×14): via INTRAVENOUS
  Filled 2012-08-06 (×38): qty 1000

## 2012-08-06 MED ORDER — DIGOXIN 0.25 MG/ML IJ SOLN
0.2500 mg | Freq: Every day | INTRAMUSCULAR | Status: DC
  Administered 2012-08-06 – 2012-08-09 (×4): 0.25 mg via INTRAVENOUS
  Filled 2012-08-06 (×5): qty 1

## 2012-08-06 MED ORDER — HEPARIN SOD (PORK) LOCK FLUSH 100 UNIT/ML IV SOLN
INTRAVENOUS | Status: AC
  Filled 2012-08-06: qty 5

## 2012-08-06 MED ORDER — LEVOTHYROXINE SODIUM 100 MCG PO TABS
100.0000 ug | ORAL_TABLET | Freq: Every day | ORAL | Status: DC
Start: 2012-08-07 — End: 2012-08-17
  Administered 2012-08-07 – 2012-08-17 (×10): 100 ug via ORAL
  Filled 2012-08-06 (×14): qty 1

## 2012-08-06 MED ORDER — METOPROLOL SUCCINATE ER 100 MG PO TB24
100.0000 mg | ORAL_TABLET | Freq: Every day | ORAL | Status: DC
  Administered 2012-08-07 – 2012-08-14 (×6): 100 mg via ORAL
  Filled 2012-08-06 (×10): qty 1

## 2012-08-06 NOTE — Progress Notes (Signed)
Follow up visit. Patient sitting in chair. Brought her a prayer shawl and prayed. Presence; listening; support.

## 2012-08-06 NOTE — Progress Notes (Signed)
[name]   DOB:01-21-1956   GN#:[id number]   HQI#:[id number]  Subjective: [name] tells me everything is "fine." Her husband tells me she sat up yesterday twice. She continues to have copious diarrhea, about 2.8 L/day. She is tolerating the foley and flexiseal w/o complaint. No head ache, visual changes, nausea or vomiting and no complaint re SOB, cough or pleurisy, no CP/P.  Objective: middle aged white [name] examined in bed Filed Vitals:   08/06/12 0611  BP: 108/66  Pulse: 135  Temp: 97.8 F (36.6 C)  Resp:     Body mass index is 30.45 kg/(m^2).  Intake/Output Summary (Last 24 hours) at 08/06/12 0828 Last data filed at 08/06/12 0457  Gross per 24 hour  Intake   1671 ml  Output   4150 ml  Net  -2479 ml    Oropharynx red (from jello), no thrush  No peripheral adenopathy  Lungs clear -- auscultated anteriorly  Heart irregular rate, about 118 on pulse  Abdomen soft, NT, dereased BS  MSK minimal bilateral ankle edema  Neuro nonfocal, disoriented to time and place  Breast exam: deferred  CBG (last 3)   Recent Labs  08/05/12 1716 08/05/12 2137 08/06/12 0738  GLUCAP 148* 140* 164*     Labs:  Lab Results  Component Value Date   WBC 2.1* 08/06/2012   HGB 11.8* 08/06/2012   HCT 35.2* 08/06/2012   MCV 77.4* 08/06/2012   PLT 54* 08/06/2012   NEUTROABS 1.6* 08/06/2012    @LASTCHEMISTRY @  Urine Studies No results found for this basename: UACOL, UAPR, USPG, UPH, UTP, UGL, UKET, UBIL, UHGB, UNIT, UROB, ULEU, UEPI, UWBC, URBC, UBAC, CAST, CRYS, UCOM, BILUA,  in the last 72 hours  Basic Metabolic Panel:  Recent Labs Lab 08/04/12 0500 08/04/12 1425 08/05/12 0500 08/05/12 1615 08/06/12 0600  NA 132* 134* 134* 135 132*  K 2.9* 3.5 3.9 4.7 5.3*  CL 97 102 104 106 105  CO2 21 20 19  17* 15*  GLUCOSE 164* 138* 151* 148* 203*  BUN 9 10 9 8 13   CREATININE 0.63 0.66 0.60 0.53 0.60  CALCIUM 7.7* 7.7* 7.6* 7.7* 7.4*  MG 1.9 1.8 1.7 1.6 1.6  PHOS 2.0* 1.6* 1.6* 1.8* 2.1*    GFR Estimated Creatinine Clearance: 78.7 ml/min (by C-G formula based on Cr of 0.6). Liver Function Tests:  Recent Labs Lab 08/04/12 0500 08/04/12 1425 08/05/12 0500 08/05/12 1615 08/06/12 0600  AST 18 18 15 17 17   ALT 22 21 17 18 16   ALKPHOS 86 85 83 93 78  BILITOT 1.0 1.0 1.0 0.9 0.9  PROT 4.9* 4.9* 4.9* 4.6* 4.4*  ALBUMIN 2.1* 1.9* 1.8* 1.7* 1.5*    Recent Labs Lab 08/01/12 0637  LIPASE 19   No results found for this basename: AMMONIA,  in the last 168 hours Coagulation profile  Recent Labs Lab 08/01/12 0750  INR 1.73*    CBC:  Recent Labs Lab 08/01/12 0637 08/02/12 1128 08/03/12 1640 08/04/12 0500 08/05/12 0500 08/06/12 0600  WBC 4.0 0.7* 0.4* 0.4* 1.0* 2.1*  NEUTROABS 3.2  --  0.1*  --  0.6* 1.6*  HGB 12.5 12.9 11.8* 11.6* 11.0* 11.8*  HCT 38.7 39.9 35.8* 34.4* 32.3* 35.2*  MCV 80.0 80.0 76.7* 75.9* 76.4* 77.4*  PLT 124* PLATELET CLUMPS NOTED ON SMEAR, COUNT APPEARS DECREASED 60* 53* 50* 54*   Cardiac Enzymes: No results found for this basename: CKTOTAL, CKMB, CKMBINDEX, TROPONINI,  in the last 168 hours BNP: No components found with this basename:  POCBNP,  CBG:  Recent Labs Lab 08/05/12 0801 08/05/12 1215 08/05/12 1716 08/05/12 2137 08/06/12 0738  GLUCAP 152* 144* 148* 140* 164*   D-Dimer No results found for this basename: DDIMER,  in the last 72 hours Hgb A1c No results found for this basename: HGBA1C,  in the last 72 hours Lipid Profile No results found for this basename: CHOL, HDL, LDLCALC, TRIG, CHOLHDL, LDLDIRECT,  in the last 72 hours Thyroid function studies  Recent Labs  08/04/12 1200  TSH 0.666   Anemia work up No results found for this basename: VITAMINB12, FOLATE, FERRITIN, TIBC, IRON, RETICCTPCT,  in the last 72 hours Microbiology Recent Results (from the past 240 hour(s))  CLOSTRIDIUM DIFFICILE BY PCR     Status: None   Collection Time    08/04/12  9:03 AM      Result Value Range Status   C difficile by pcr  NEGATIVE  NEGATIVE Final      Studies:  No results found.  Assessment/ Plan: 57 y.o. [name] currently day 6, cycle 2 chemotherapy and day 5 neulasta, admitted with severe diarrhea, vomiting, dehydration, multiple electrolyte abnormalities, confusion and severe neutropenia    (1) status post right mastectomy under the care of Dr. 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 Lymphovascular and appearing neural invasion was identified. 2 of 18 lymph nodes were involved. Margins were clear. Pathologic staging pT3, pN1a, stage IIIA. (Note the original clinical staging was IIB.)   (2) being treated in the adjuvant setting with docetaxel/cyclophosphamide given every 3 weeks, with Neulasta on day 2 for granulocyte support. First treatment was given on 07/06/2012. The original goal was to complete a total of 6 cycles, but due to in tolerance, this has been decreased to 4 cycles, and we also decreased her doses by 15% beginning with cycle 2, given 07/29/2012 .  (3) patient will need postmastectomy radiation, which will be followed by antiestrogen therapy.   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   (5) diabetes-- currently on SSI  (6) atrial fib.--she appears not to be absorbing her cardizem and beta blocker well; I have increased the beta blocker dose slightly; TSH < 1.0, also dropped the synthroid dose-- will ask cardiology to help  (7) fever-neutropenia: day 2 primaxin/ vanco; neutrophils recovering  (8) diarrhea: c diff negative; continue to replace fluids and electrolytes; on octreotide  (9) Full code. Following labs daily. Will d/c flexiseal once voulme of diarrhea decreases to <500 cc/day. Hope for d/c to home next week.   [name] 08/06/2012

## 2012-08-06 NOTE — Consult Note (Signed)
CARDIOLOGY CONSULT NOTE  Patient ID: DEZYRE HOEFER MRN: [id number] DOB/AGE: November 19, 1955 57 y.o.  Admit date: 08/03/2012  Primary PhysicianO'SULLIVAN,MELISSA S., NP Primary Cardiologist   [place]  HPI:     The patient has breast cancer and has received radiation therapy and is receiving chemotherapy. Currently she is admitted with dehydration and diarrhea and confusion and decreased neutrophils. When she was hospitalized previously she was seen in consultation on July 14, 2012. At that time she had newly diagnosed atrial fibrillation. Meds were adjusted. She converted spontaneously to normal sinus rhythm. With her current admission she is again reverted to atrial fibrillation. She has diarrhea. It is possible that she is not absorbing her medicines. At home she was on diltiazem long-acting 120 mg daily and metoprolol short acting 25 mg 3 times daily. At the current time she is in bed and very limited. She is quite ill. Her atrial fib rate appears to be in the range of 110 up to 150. We know that she has good left ventricular function. Her echo on July 14, 2012 revealed an ejection fraction of 60-65%. There were no wall motion abnormalities. Right ventricular size and function were normal.      Past Medical History  Diagnosis Date  . Depression   . DVT (deep venous thrombosis)     hx of on HRT left leg ~2006  . GERD (gastroesophageal reflux disease)   . Hyperlipidemia   . Hypertension   . Hypothyroidism   . PPD positive, treated     rx inh   . OSA on CPAP   . Diabetes mellitus   . 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 Breast cancer     a. 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 Diabetes Mother   . Hypertension Mother   . Arthritis Mother   . Heart disease Mother   . Heart failure Mother   . Other Mother      benign breast mass  . Stroke Father   . Heart disease Father   . Diabetes Paternal Grandmother   . Colon cancer Paternal Grandfather     History   Social History  . Marital Status: Married    Spouse Name: N/A    Number of Children: 2  . Years of Education: N/A   Occupational History  . SECRETARY [employer]   Social History Main Topics  . Smoking status: Former Games developer  . Smokeless tobacco: Never Used  . Alcohol Use: No  . Drug Use: No  . Sexually Active: No   Other Topics Concern  . 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     Past Surgical History  Procedure Laterality Date  . Tubal ligation    . Cholecystectomy    . Foot surgery    . Eye surgery    . Cataract extraction    . Percutaneous liver biopsy    . Breast biopsy      left breast  . Mastectomy modified radical  06/08/2012    Procedure:  MASTECTOMY MODIFIED RADICAL;  Surgeon: Mariella Saa, MD;  Location: [facility] OR;  Service: General;  Laterality: Right;  . Portacath placement  06/08/2012    Procedure: INSERTION PORT-A-CATH;  Surgeon: Mariella Saa, MD;  Location: [facility] OR;  Service: General;  Laterality: Left;     Prescriptions prior to admission  Medication Sig Dispense Refill  . aspirin EC 81 MG tablet Take 81 mg by mouth every morning.      [name] atorvastatin (LIPITOR) 10 MG tablet Take 10 mg by mouth at bedtime.   90 tablet  1  . buPROPion (WELLBUTRIN SR) 150 MG 12 hr tablet Take 150 mg by mouth every morning.       [name] dexamethasone (DECADRON) 4 MG tablet Take 4 mg by mouth as directed. Take 2 tabs twice daily on day before, day of, and for 1 day following chemo. On the second day following chemo, Take 2 tablets in the morning only.      . diltiazem (CARDIZEM CD) 120 MG 24 hr capsule Take 120 mg by mouth every morning.        . escitalopram (LEXAPRO) 10 MG tablet Take 10 mg by mouth at bedtime.      [name] levothyroxine (SYNTHROID, LEVOTHROID) 125 MCG tablet Take 125 mcg by mouth daily before breakfast.      . lidocaine-prilocaine (EMLA) cream Apply 1 application topically as needed (for port access).      . LORazepam (ATIVAN) 0.5 MG tablet Take 1 mg by mouth 2 (two) times daily as needed (for nausea or sleep). 1 tab up to twice daily PRN anxiety, insomnia and/or nausea      . metoprolol succinate (TOPROL XL) 25 MG 24 hr tablet Take 3 tablets (75 mg total) by mouth daily. Take with or immediately following a meal.  90 tablet  1  . omeprazole (PRILOSEC) 20 MG capsule Take 1 capsule (20 mg total) by mouth 2 (two) times daily.  180 capsule  2  . ondansetron (ZOFRAN ODT) 4 MG disintegrating tablet Take 1 tablet (4 mg total) by mouth every 8 (eight) hours as needed for nausea.  20 tablet  0  . ondansetron (ZOFRAN) 4 MG tablet Take 1 tablet (4 mg total) by mouth every 6 (six) hours.  12 tablet  0  . oxyCODONE-acetaminophen (PERCOCET/ROXICET) 5-325 MG per tablet Take 1-2 tablets by mouth every 4 (four) hours as needed for pain.  20 tablet  0  . PRESCRIPTION MEDICATION Chemotherapy at [facility]      . prochlorperazine (COMPAZINE) 10 MG tablet Take 10 mg by mouth as directed. Take 1 tablet before meals and at bedtime for 3 days after chemo, then 1 tab every 6 hours as needed for nausea      . saccharomyces boulardii (FLORASTOR) 250 MG capsule Take 1 capsule (250 mg total) by mouth 2 (two) times daily.  60 capsule  1  . saxagliptin HCl (ONGLYZA) 5 MG TABS tablet Take 5 mg by mouth every morning.       Review of systems:        Currently the patient is quite weak and not responding well. Her husband is in the room. I did ask him a few questions. She is not currently having chest pain or shortness of breath.  Physical Exam: Blood pressure 108/66, pulse 135, temperature 97.8 F (36.6 C), temperature source Oral, resp. rate 28, height 5\' 4"   (1.626 m), weight 177 lb 7.5 oz (80.5 kg), SpO2 97.00%.    The  patient is resting comfortably. I cannot fully assess her mental status. There is no jugulovenous distention. Lungs reveal scattered rhonchi. Cardiac exam reveals S1 and S2. The rhythm is irregularly irregular. There is a catheter in her chest wall. Abdomen is soft. There is no significant peripheral edema. Labs:   Lab Results  Component Value Date   WBC 2.1* 08/06/2012   HGB 11.8* 08/06/2012   HCT 35.2* 08/06/2012   MCV 77.4* 08/06/2012   PLT 54* 08/06/2012    Recent Labs Lab 08/06/12 0600  NA 132*  K 5.3*  CL 105  CO2 15*  BUN 13  CREATININE 0.60  CALCIUM 7.4*  PROT 4.4*  BILITOT 0.9  ALKPHOS 78  ALT 16  AST 17  GLUCOSE 203*   Lab Results  Component Value Date   TROPONINI <0.30 07/14/2012    Lab Results  Component Value Date   CHOL 168 10/14/2011   CHOL 222* 07/11/2011   CHOL 198 01/08/2011     Radiology:    EKG:   The last 12-lead EKG available for my review is dated March 8. She is not on telemetry.  ASSESSMENT AND PLAN:     HYPOTHYROIDISM     It was noted that her TSH was somewhat decreased. Her thyroid dose was reduced.    Cancer of breast   Neutropenic fever      The patient is neutropenic at this time and feeling poorly. Oncology is aggressively managing her. She is a full code.    A-fib    It appears that the patient reverts to atrial fibrillation when she becomes ill. She had spontaneously converted to sinus during the March 5 admission. She is now back in atrial fibrillation. Her rate is increased. She is not a candidate for anticoagulation at this time. She is also not a good candidate for other antiarrhythmic drugs. Therefore we have to find a way to keep her rate controlled. Currently her rate is increased. She's having significant diarrhea. It is possible that the absorption of her meds are being affected. The plan will be to add digoxin. This can be given IV or orally. For now we will give it  IV. Her renal function is normal. Her potassium is high normal. By starting her on digoxin at this time it does not mean that we have committed this for the long term. However for now this will be a good way to help control her rate if she is not absorbing other meds well.    Diarrhea     This is being treated by the primary team.    Hyperkalemia   Potassium was up to 5.3. The IV fluids that had contained potassium are being adjusted.  I have ordered digoxin loading. Renal function is normal. I have ordered 0.25 mg IV digoxin daily. Decision will have to be made over time about converting her to oral digoxin and about her dosing.  Jerral Bonito, MD

## 2012-08-06 NOTE — Progress Notes (Signed)
Spoke with pt in regards to CPAP, states does not wish to wear tonight. Understands to call if at any point this changes. RN aware. RT will assist as needed.

## 2012-08-07 DIAGNOSIS — R710 Precipitous drop in hematocrit: Secondary | ICD-10-CM

## 2012-08-07 DIAGNOSIS — R5381 Other malaise: Secondary | ICD-10-CM

## 2012-08-07 DIAGNOSIS — F29 Unspecified psychosis not due to a substance or known physiological condition: Secondary | ICD-10-CM

## 2012-08-07 LAB — CBC WITH DIFFERENTIAL/PLATELET
Eosinophils Relative: 0 % (ref 0–5)
HCT: 25.1 % — ABNORMAL LOW (ref 36.0–46.0)
Lymphs Abs: 0 10*3/uL — ABNORMAL LOW (ref 0.7–4.0)
MCH: 25.9 pg — ABNORMAL LOW (ref 26.0–34.0)
MCV: 78.4 fL (ref 78.0–100.0)
Monocytes Absolute: 0 10*3/uL — ABNORMAL LOW (ref 0.1–1.0)
Monocytes Relative: 1 % — ABNORMAL LOW (ref 3–12)
Neutro Abs: 1.5 10*3/uL — ABNORMAL LOW (ref 1.7–7.7)
RBC: 3.2 MIL/uL — ABNORMAL LOW (ref 3.87–5.11)
WBC: 1.5 10*3/uL — ABNORMAL LOW (ref 4.0–10.5)

## 2012-08-07 LAB — COMPREHENSIVE METABOLIC PANEL
ALT: 15 U/L (ref 0–35)
Alkaline Phosphatase: 56 U/L (ref 39–117)
Alkaline Phosphatase: 90 U/L (ref 39–117)
BUN: 18 mg/dL (ref 6–23)
CO2: 10 mEq/L — CL (ref 19–32)
Chloride: 82 mEq/L — ABNORMAL LOW (ref 96–112)
Creatinine, Ser: 0.65 mg/dL (ref 0.50–1.10)
GFR calc Af Amer: >90 mL/min (ref >90)
GFR calc Af Amer: >90 mL/min (ref >90)
GFR calc non Af Amer: >90 mL/min (ref >90)
Glucose, Bld: 1152 mg/dL (ref 70–99)
Glucose, Bld: 192 mg/dL — ABNORMAL HIGH (ref 70–99)
Potassium: 3.2 mEq/L — ABNORMAL LOW (ref 3.5–5.1)
Potassium: 4.7 mEq/L (ref 3.5–5.1)
Sodium: 102 mEq/L — CL (ref 135–145)
Total Bilirubin: 0.5 mg/dL (ref 0.3–1.2)
Total Protein: 4.3 g/dL — ABNORMAL LOW (ref 6.0–8.3)

## 2012-08-07 LAB — GLUCOSE, CAPILLARY
Glucose-Capillary: 182 mg/dL — ABNORMAL HIGH (ref 70–99)
Glucose-Capillary: 154 mg/dL — ABNORMAL HIGH (ref 70–99)

## 2012-08-07 LAB — PHOSPHORUS: Phosphorus: 2.4 mg/dL (ref 2.3–4.6)

## 2012-08-07 LAB — PREPARE RBC (CROSSMATCH)

## 2012-08-07 MED ORDER — METOPROLOL TARTRATE 1 MG/ML IV SOLN
2.5000 mg | INTRAVENOUS | Status: DC | PRN
  Administered 2012-08-07 – 2012-08-08 (×7): 2.5 mg via INTRAVENOUS
  Filled 2012-08-07 (×7): qty 5

## 2012-08-07 MED ORDER — SODIUM CHLORIDE 0.9 % IV SOLN
500.0000 mg | Freq: Four times a day (QID) | INTRAVENOUS | Status: DC
  Administered 2012-08-07 – 2012-08-09 (×8): 500 mg via INTRAVENOUS
  Filled 2012-08-07 (×9): qty 500

## 2012-08-07 NOTE — Progress Notes (Signed)
Diarrhea is slowing down a little. She is very weak.  Some episodes of confusion.  On IV abx.  So far the cultures have been (-).  WBC is 1.5.  Her Hgb is dropping.  Now 8.3. The stool looks very dark.  Will check for occult blood.   I believe she will benefit form 2 units of blood given the drop in her hemoglobin.  She is still on clear liquids.  On Sandostatin and Probiotic for the diarrhea.  Husband is very frustrated that this happened again, even with dose reduction of chemo.  She may need a different regimen once she recovers.  Platelets are 33K.  If she is actively bleeding from GI tract, we may need to give platelet transfusion.  Also with a fib.  Appreciate cardiology help.  Her HR is still quite high.  Her BP is ok.  I suspect that her ability to absorb meds is decreased right now.  Again, Cardiology is helping [name] out and I will defer to their expertise regarding a fib management.  She is afebrile. LUNGS: Clear. COR:  Tachy and irregular c/w a fib. ABD:  Soft.  Very few bowel sounds.  (-) Tenderness. EXT:  (-) clubbing.  Some trace edema. CNS:  Lethargic, dose respond appropriately to questions.  Continue supportive care for chemo-induced symptoms.  Pete E.  Lamentations 3:22-23

## 2012-08-07 NOTE — Progress Notes (Signed)
Pt has been restless at times during the shift, frequently trying to get from bed to chair. Please consider ordering something for agitation/restlessness.

## 2012-08-07 NOTE — Progress Notes (Signed)
Spoke with pt & her husband about wearing cpap tonight. She hasn't been wearing & doesn't want to wear tonight.  Jacqulynn Cadet RRT

## 2012-08-07 NOTE — Significant Event (Addendum)
Rapid Response Event Note  Overview: Time Called: 1130 Event Type: Cardiac  Initial Focused Assessment: Called due to pt being in rapid at fib and  being less responsive. Pt will arouse, saying she does not want telemetry.  Says she is tired.   Interventions: Moniter shows at fib rate 140's-150's.  IV dig given by bedside RN.  Dr Myna Hidalgo and Patty Sermons paged.  Husband at bedside, wants everything done, very emotional. Heart rate had been in the 170's according to bedside RN. See flow sheet for vitals.   Event Summary: 1215pm  Still waiting for response from cardiology.  Pt stable, cont in at fib, in no acute distress. HR 130's.  BP 132/73 .      at      at          [place], Zannie Kehr

## 2012-08-07 NOTE — Progress Notes (Signed)
Subjective:  No chest pain or dyspnea. Remains in atrial fib on bedside exam.  Not on telemetry.  Objective:  Vital Signs in the last 24 hours: Temp:  [97.3 F (36.3 C)-97.7 F (36.5 C)] 97.7 F (36.5 C) (03/29 0457) Pulse Rate:  [73-163] 163 (03/29 0457) BP: (102-118)/(68-78) 118/68 mmHg (03/29 0457) SpO2:  [97 %-99 %] 98 % (03/29 0457)  Intake/Output from previous day: 03/28 0701 - 03/29 0700 In: 4252.1 [I.V.:2952.1; IV Piggyback:1300] Out: 1000 [Urine:1000] Intake/Output from this shift:    . antiseptic oral rinse  15 mL Mouth Rinse BID  . aspirin EC  81 mg Oral q morning - 10a  . atorvastatin  10 mg Oral QHS  . buPROPion  150 mg Oral q morning - 10a  . digoxin  0.25 mg Intravenous Daily  . diltiazem  120 mg Oral q morning - 10a  . escitalopram  10 mg Oral QHS  . imipenem-cilastatin  500 mg Intravenous Q8H  . insulin aspart  0-15 Units Subcutaneous TID WC  . levothyroxine  100 mcg Oral QAC breakfast  . metoprolol succinate  100 mg Oral Q breakfast  . multivitamin with minerals  1 tablet Oral Daily  . octreotide  100 mcg Subcutaneous Q12H  . pantoprazole (PROTONIX) IV  40 mg Intravenous QHS  . prochlorperazine  5 mg Intravenous QID  . saccharomyces boulardii  250 mg Oral BID  . vancomycin  1,000 mg Intravenous Q12H   . 0.9 % sodium chloride with kcl 125 mL/hr at 08/07/12 0444    Physical Exam: The patient appears to be in no distress.  Head and neck exam reveals that the pupils are equal and reactive.  The extraocular movements are full.  There is no scleral icterus.  Mouth and pharynx are benign.  No lymphadenopathy.  No carotid bruits.  The jugular venous pressure is normal.  Thyroid is not enlarged or tender.  Chest is clear to percussion and auscultation.  No rales or rhonchi.  Expansion of the chest is symmetrical.  Heart reveals no abnormal lift or heave.  First and second heart sounds are normal.  There is no murmur gallop rub or click. Pulse is  irregular.  The abdomen is soft and nontender.  Bowel sounds are normoactive.  There is no hepatosplenomegaly or mass.  There are no abdominal bruits.  Extremities reveal no phlebitis or edema.  Pedal pulses are good.  There is no cyanosis or clubbing.  Neurologic exam is normal strength and no lateralizing weakness.  No sensory deficits.  Integument reveals no rash  Lab Results:  Recent Labs  08/06/12 0600 08/07/12 0530  WBC 2.1* 1.5*  HGB 11.8* 8.3*  PLT 54* 33*    Recent Labs  08/07/12 0530 08/07/12 0700  NA 102* 133*  K 3.2* 4.7  CL 82* 107  CO2 10* 15*  GLUCOSE 1152* 192*  BUN 13 18  CREATININE 0.44* 0.65   No results found for this basename: TROPONINI, CK, MB,  in the last 72 hours Hepatic Function Panel  Recent Labs  08/07/12 0700  PROT 4.3*  ALBUMIN 1.5*  AST 39*  ALT 24  ALKPHOS 90  BILITOT 0.7   No results found for this basename: CHOL,  in the last 72 hours No results found for this basename: PROTIME,  in the last 72 hours  Imaging: No results found.  Cardiac Studies: Not on telemetry. Assessment/Plan:  1. Paroxysmal atrial fibrillation, asymptomatic at present. 2. Anemia with significant fall in Hgb  since yesterday. 3. Breast cancer, chemotherapy.  Plan: will continue IV lanoxin for rate control for now.  Potassium normal. Renal function normal. EKG in am   LOS: 4 days    Cassell Clement 08/07/2012, 8:30 AM

## 2012-08-07 NOTE — Progress Notes (Addendum)
Paged at 1435 and in unit at 1457.  Ms Buckels is minimally responsive. She appears disoriented and is pulling on her IVs and the oxygen sensor. She calls out to go to the restroom and is told by her husband she is "hooked up to where she is." This was passed on to the room nurses as an indication of her state of mind and emotions.  Ms Sloop is hospitalized after her second round of chemo, he was hospitalized after her first round also. Based on her experience she feels she may not wish to continue such treatments. Her husband reports she has not been seriously ill in their 66 years of marriage. Thus her current state of health has both her and her husband frightened and despondent. Husband, [name], will stay as long as he can, but has been told by his bosses he will lose his job if he is absent at any time. This is creating added anxiety for both patient and husband. Family members are scheduled to come and sit with her.  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   She is a person of great Christian faith and connected to a supportive community of faith, where she has been active for several years. Members of that church are expected to visit as frequently as possible.  Husband, [name]. is very upset and is prone to crying. He fears his wife is now on the verge of dying and is coping with their mutual decision to consider discontinuing chemo treatments. Both are aware of the probable result of this decision. At this point that result may be more in theory than actualized. Speaking about it makes both tense and frightened.  Spiritual care provided in the form of grief counsel, listening, prayer and presence.  Chaplain should be paged if more spiritual care may assist.  RECOMMEND: Daytime Chaplains visit Ms Schnoebelen on Monday, as, at present, she will be alone during that day. Her husband will be at work and other family members have not  been lined up to sit with her.  Benjie Karvonen. Finn Altemose, DMin Chaplain

## 2012-08-07 NOTE — Progress Notes (Signed)
Late entry:, Rapid response was called by this RN due to patient being lethargic, arousable only with shaking and unable to maintain alertness. Speech was slurred. Decision was made to transfer to telemetry. Dr. Myna Hidalgo agreed by telephone and Theodore Demark PA agreed. Telemetry RN was contacted and decision was made on that floor to transfer pt. To step-down. Order was obtained and report was called. Patient's husband was at bedside and is aware of plan.

## 2012-08-07 NOTE — Progress Notes (Signed)
Glucose called by lab and was 1152. CBG was checked and found to 181. CMP with add-on Mg and Phos were redrawn. Awaiting results at this time.

## 2012-08-08 DIAGNOSIS — T451X5A Adverse effect of antineoplastic and immunosuppressive drugs, initial encounter: Secondary | ICD-10-CM

## 2012-08-08 DIAGNOSIS — R197 Diarrhea, unspecified: Secondary | ICD-10-CM

## 2012-08-08 DIAGNOSIS — E46 Unspecified protein-calorie malnutrition: Secondary | ICD-10-CM

## 2012-08-08 DIAGNOSIS — E860 Dehydration: Secondary | ICD-10-CM

## 2012-08-08 LAB — CBC WITH DIFFERENTIAL/PLATELET
Basophils Relative: 2 % — ABNORMAL HIGH (ref 0–1)
Eosinophils Relative: 0 % (ref 0–5)
HCT: 40.6 % (ref 36.0–46.0)
Hemoglobin: 13.7 g/dL (ref 12.0–15.0)
Lymphs Abs: 0.8 10*3/uL (ref 0.7–4.0)
MCH: 26.3 pg (ref 26.0–34.0)
MCV: 78.4 fL (ref 78.0–100.0)
Monocytes Absolute: 0.2 10*3/uL (ref 0.1–1.0)
RBC: 5.18 MIL/uL — ABNORMAL HIGH (ref 3.87–5.11)

## 2012-08-08 LAB — COMPREHENSIVE METABOLIC PANEL
ALT: 29 U/L (ref 0–35)
Alkaline Phosphatase: 128 U/L — ABNORMAL HIGH (ref 39–117)
CO2: 15 mEq/L — ABNORMAL LOW (ref 19–32)
GFR calc Af Amer: >90 mL/min (ref >90)
GFR calc non Af Amer: >90 mL/min (ref >90)
Glucose, Bld: 144 mg/dL — ABNORMAL HIGH (ref 70–99)
Potassium: 4.5 mEq/L (ref 3.5–5.1)
Sodium: 137 mEq/L (ref 135–145)
Total Protein: 4.1 g/dL — ABNORMAL LOW (ref 6.0–8.3)

## 2012-08-08 LAB — TYPE AND SCREEN

## 2012-08-08 LAB — URINE CULTURE

## 2012-08-08 LAB — GLUCOSE, CAPILLARY: Glucose-Capillary: 156 mg/dL — ABNORMAL HIGH (ref 70–99)

## 2012-08-08 LAB — VANCOMYCIN, TROUGH: Vancomycin Tr: 14.5 ug/mL (ref 10.0–20.0)

## 2012-08-08 LAB — OCCULT BLOOD X 1 CARD TO LAB, STOOL
Fecal Occult Bld: POSITIVE — AB
Fecal Occult Bld: POSITIVE — AB

## 2012-08-08 MED ORDER — DILTIAZEM HCL 100 MG IV SOLR
5.0000 mg/h | INTRAVENOUS | Status: DC
  Administered 2012-08-08: 10 mg/h via INTRAVENOUS
  Administered 2012-08-08: 5 mg/h via INTRAVENOUS
  Administered 2012-08-08 – 2012-08-10 (×4): 10 mg/h via INTRAVENOUS
  Filled 2012-08-08: qty 100

## 2012-08-08 MED ORDER — SODIUM CHLORIDE 0.9 % IJ SOLN
10.0000 mL | INTRAMUSCULAR | Status: DC | PRN
Start: 2012-08-08 — End: 2012-08-17
  Administered 2012-08-08: 20 mL

## 2012-08-08 MED ORDER — ALTEPLASE 2 MG IJ SOLR
2.0000 mg | Freq: Once | INTRAMUSCULAR | Status: AC
  Administered 2012-08-08: 2 mg
  Filled 2012-08-08: qty 2

## 2012-08-08 NOTE — Progress Notes (Signed)
[name]   DOB:1955/07/31   ZO#:[id number]   JWJ#:[id number]  Subjective:  Patients was transferred to ICU/Step down for monitoring on 3/29. She is lethargic but responsive. Slept well overnight.  Diarrhea slowing down significantly.  No fevers noted.    Objective: middle aged white woman examined in bed Filed Vitals:   08/08/12 0400  BP: 99/61  Pulse: 95  Temp: 97.7 F (36.5 C)  Resp: 28    Body mass index is 31.81 kg/(m^2).  Intake/Output Summary (Last 24 hours) at 08/08/12 0717 Last data filed at 08/08/12 0700  Gross per 24 hour  Intake 2587.5 ml  Output   1760 ml  Net  827.5 ml    Oropharynx red (from jello), no thrush  No peripheral adenopathy  Lungs clear -- auscultated anteriorly  Heart irregular rate, about 118 on pulse  Abdomen soft, NT, dereased BS  MSK minimal bilateral ankle edema  Neuro nonfocal, disoriented to time and place    CBG (last 3)   Recent Labs  08/07/12 1249 08/07/12 1709 08/07/12 2203  GLUCAP 127* 134* 128*     Labs:  Lab Results  Component Value Date   WBC 3.1* 08/08/2012   HGB 13.7 08/08/2012   HCT 40.6 08/08/2012   MCV 78.4 08/08/2012   PLT 68* 08/08/2012   NEUTROABS 2.0 08/08/2012   Basic Metabolic Panel:  Recent Labs Lab 08/05/12 1615 08/06/12 0600 08/07/12 0530 08/07/12 0700 08/08/12 0340  NA 135 132* 102* 133* 137  K 4.7 5.3* 3.2* 4.7 4.5  CL 106 105 82* 107 111  CO2 17* 15* 10* 15* 15*  GLUCOSE 148* 203* 1152* 192* 144*  BUN 8 13 13 18 15   CREATININE 0.53 0.60 0.44* 0.65 0.60  CALCIUM 7.7* 7.4* 4.8* 7.5* 7.3*  MG 1.6 1.6 1.1* 1.7 1.6  PHOS 1.8* 2.1* 1.6* 2.4 2.2*    Recent Labs Lab 08/05/12 1615 08/06/12 0600 08/07/12 0530 08/07/12 0700 08/08/12 0340  AST 17 17 24  39* 52*  ALT 18 16 15 24 29   ALKPHOS 93 78 56 90 128*  BILITOT 0.9 0.9 0.5 0.7 0.8  PROT 4.6* 4.4* 2.8* 4.3* 4.1*  ALBUMIN 1.7* 1.5* 1.0* 1.5* 1.5*    Assessment/ Plan: 57 y.o. [name] woman S/P cycle 2 chemotherapy followed by  neulasta, admitted with severe diarrhea, vomiting, dehydration, multiple electrolyte abnormalities, confusion and severe neutropenia    (1) status post right mastectomy under the care of Dr. 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 Lymphovascular and appearing neural invasion was identified. 2 of 18 lymph nodes were involved. Margins were clear. Pathologic staging pT3, pN1a, stage IIIA. (Note the original clinical staging was IIB.)   (2) being treated in the adjuvant setting with docetaxel/cyclophosphamide given every 3 weeks, with Neulasta on day 2 for granulocyte support. First treatment was given on 07/06/2012. The original goal was to complete a total of 6 cycles, but due to in tolerance, this has been decreased to 4 cycles, and we also decreased her doses by 15% beginning with cycle 2, given 07/29/2012 .  (3) Pancytopenia: due to chemotherapy: improving at this time. She is S/P 2 units of PRBC on 3/29. No bleeding noted today.  White cells recovering as well.  (4) atrial fib.--Rate still in the 100s, good BP. Appreciate Cardiology input. On IV Digoxin.    (5) fever-neutropenia: day 4  primaxin/ vanco; neutrophils recovering  (6) diarrhea: c diff negative; continue to replace fluids and electrolytes; on octreotide. This is improving dramatically.   (7) Full code. Following labs daily. Will d/c flexiseal once voulme of diarrhea decreases to <500 cc/day. Hope for d/c to home next week. Still recording.  (8) Nutrition: She has not been able to take po for a while now. I would suggest TNA if she does not have rapid recovery.  :   Teona Vargus 08/08/2012

## 2012-08-08 NOTE — Progress Notes (Signed)
Pt has not been wearing cpap at night & doesn't wish to tonight.  Jacqulynn Cadet RRT

## 2012-08-08 NOTE — Progress Notes (Addendum)
ANTIBIOTIC CONSULT NOTE - FOLLOW UP  Pharmacy Consult for Vancomycin/(Imipenem -MD) Indication: Febrile Neutropenia  Allergies  Allergen Reactions  . Olmesartan Medoxomil Cough    REACTION: ? if cough  . Tetracycline Hcl     Unknown reaction, too long for patient to remember   . Venlafaxine     REACTION: severe dry moouth  . Adhesive (Tape) Rash  Patient Measurements: Height: 5\' 4"  (162.6 cm) Weight: 185 lb 6.5 oz (84.1 kg) IBW/kg (Calculated) : 54.7   Vital Signs: Temp: 97.3 F (36.3 C) (03/30 1600) Temp src: Axillary (03/30 1600) BP: 101/62 mmHg (03/30 1700) Pulse Rate: 84 (03/30 1700)  Labs:  Recent Labs  08/06/12 0600 08/07/12 0530 08/07/12 0700 08/08/12 0340  WBC 2.1* 1.5*  --  3.1*  HGB 11.8* 8.3*  --  13.7  PLT 54* 33*  --  68*  CREATININE 0.60 0.44* 0.65 0.60   Estimated Creatinine Clearance: 80.5 ml/min (by C-G formula based on Cr of 0.6).  Recent Labs  08/08/12 1653  VANCOTROUGH 14.5     Microbiology: 3/27 blood: NGTD 3/27 urine: 100 K GNR 3/26 Cdiff - negatve 3/29 MRSA by PCR negative   Anti-infectives   Start     Dose/Rate Route Frequency Ordered Stop   08/07/12 1200  imipenem-cilastatin (PRIMAXIN) 500 mg in sodium chloride 0.9 % 100 mL IVPB     500 mg 200 mL/hr over 30 Minutes Intravenous 4 times per day 08/07/12 0838     08/05/12 1700  vancomycin (VANCOCIN) IVPB 1000 mg/200 mL premix     1,000 mg 200 mL/hr over 60 Minutes Intravenous Every 12 hours 08/05/12 1607     08/05/12 1600  imipenem-cilastatin (PRIMAXIN) 500 mg in sodium chloride 0.9 % 100 mL IVPB  Status:  Discontinued     500 mg 200 mL/hr over 30 Minutes Intravenous 3 times per day 08/05/12 1524 08/07/12 0838   08/04/12 1000  metroNIDAZOLE (FLAGYL) IVPB 500 mg  Status:  Discontinued     500 mg 100 mL/hr over 60 Minutes Intravenous Every 8 hours 08/04/12 0830 08/05/12 0856      Assessment: 56 YOFS/P cycle 2 chemotherapy followed by neulasta, admitted with severe diarrhea,  vomiting, dehydration, multiple electrolyte abnormalities, confusion and severe neutropenia  3/27 >> Vanc >> 3/27 >> Primaxin >>   Tmax:101.2 3/27 at 1500 - afebrile since then WBCs: up (0.4 >2.1 >1.5 > 3.1), ANC 2 Renal: wnl/stable, CrCl 80 ml/min CG and > 100 N  3/27 blood: NGTD 3/27 urine: 100 K GNR 3/26 Cdiff - negatve 3/29 MRSA by PCR negative  Dose changes/drug level info:  3/29: MD increased primaxin to q6h per our recommendation 3/30: VT = 14.3mcg/ml on 1gm IV q12h (prior to 7th dose)  Goal of Therapy:  Vancomycin trough level 15-20 mcg/ml  Plan:   Continue vancomycin 1gm IV q12h as vancomycin trough is appropriate  Juliette Alcide, PharmD, BCPS.   Pager: [phone] 08/08/2012 5:46 PM

## 2012-08-08 NOTE — Progress Notes (Signed)
Paged at [phone] to assist Ms Buchbinder prepare a HPOA. Chaplain recorded Ms Stumph's responses and spoke in private about her wishes in case she is no longer able to make health decisions for herself.   Attending nurse found two witnesses and contacted the [facility] to act as notary, Paperwork executed and a copy of the document was placed in Ms Shinsato's paper charts.  The original was given to Ms Bracken who passed it to her husband for safe keeping.  Benjie Karvonen. Altovise Wahler, DMin, APC Chaplain

## 2012-08-08 NOTE — Progress Notes (Signed)
Subjective:  Patient was moved to telemetry yesterday after having sustained rapid AFassociated with being less responsive.  Rate still 120s this am but has not yet had any of her morning pills. Has mouth soreness and refusing po meds. Denies chest pain or dyspnea.  Objective:  Vital Signs in the last 24 hours: Temp:  [97.3 F (36.3 C)-98.7 F (37.1 C)] 97.7 F (36.5 C) (03/30 0400) Pulse Rate:  [37-159] 48 (03/30 0800) Resp:  [21-28] 24 (03/30 0800) BP: (92-132)/(45-88) 106/58 mmHg (03/30 0800) SpO2:  [95 %-98 %] 96 % (03/30 0800) Weight:  [185 lb 6.5 oz (84.1 kg)] 185 lb 6.5 oz (84.1 kg) (03/30 0000)  Intake/Output from previous day: 03/29 0701 - 03/30 0700 In: 2587.5 [I.V.:1037.5; Blood:750; IV Piggyback:800] Out: 1760 [Urine:810; Stool:950] Intake/Output from this shift: Total I/O In: 20 [I.V.:20] Out: -   . antiseptic oral rinse  15 mL Mouth Rinse BID  . aspirin EC  81 mg Oral q morning - 10a  . atorvastatin  10 mg Oral QHS  . buPROPion  150 mg Oral q morning - 10a  . digoxin  0.25 mg Intravenous Daily  . diltiazem  120 mg Oral q morning - 10a  . escitalopram  10 mg Oral QHS  . imipenem-cilastatin  500 mg Intravenous Q6H  . insulin aspart  0-15 Units Subcutaneous TID WC  . levothyroxine  100 mcg Oral QAC breakfast  . metoprolol succinate  100 mg Oral Q breakfast  . multivitamin with minerals  1 tablet Oral Daily  . octreotide  100 mcg Subcutaneous Q12H  . pantoprazole (PROTONIX) IV  40 mg Intravenous QHS  . prochlorperazine  5 mg Intravenous QID  . saccharomyces boulardii  250 mg Oral BID  . vancomycin  1,000 mg Intravenous Q12H   . 0.9 % sodium chloride with kcl 125 mL/hr at 08/07/12 0444    Physical Exam: The patient appears to be in no distress.  Head and neck exam reveals that the pupils are equal and reactive.  The extraocular movements are full.  There is no scleral icterus.  Mouth and pharynx are benign.  No lymphadenopathy.  No carotid bruits.  The  jugular venous pressure is normal.  Thyroid is not enlarged or tender.  Chest is clear to percussion and auscultation.  No rales or rhonchi.  Expansion of the chest is symmetrical.  Heart reveals no abnormal lift or heave.  First and second heart sounds are normal.  There is no murmur gallop rub or click. Pulse is irregular and rapid.  The abdomen is soft and nontender.  Bowel sounds are normoactive.  There is no hepatosplenomegaly or mass.  There are no abdominal bruits.  Extremities reveal no phlebitis or edema.  Pedal pulses are good.  There is no cyanosis or clubbing.  Neurologic exam is normal strength and no lateralizing weakness.  No sensory deficits.  Integument reveals no rash  Lab Results:  Recent Labs  08/07/12 0530 08/08/12 0340  WBC 1.5* 3.1*  HGB 8.3* 13.7  PLT 33* 68*    Recent Labs  08/07/12 0700 08/08/12 0340  NA 133* 137  K 4.7 4.5  CL 107 111  CO2 15* 15*  GLUCOSE 192* 144*  BUN 18 15  CREATININE 0.65 0.60   No results found for this basename: TROPONINI, CK, MB,  in the last 72 hours Hepatic Function Panel  Recent Labs  08/08/12 0340  PROT 4.1*  ALBUMIN 1.5*  AST 52*  ALT 29  ALKPHOS  128*  BILITOT 0.8   No results found for this basename: CHOL,  in the last 72 hours No results found for this basename: PROTIME,  in the last 72 hours  Imaging: No results found.  Cardiac Studies: Telemetry shows rapid atrial fib. Assessment/Plan:  1. Atrial fib with rapid ventricular response. 2. Anemia improved after 2 units of packed cells yesterday. 3. Breast cancer, chemotherapy. 4. Sore mouth from chemo, refusing po meds  Plan: will continue IV lanoxin for rate control for now.  Change cardizem to IV since unable to take po.  Continue PRN metoprolol IV for rate control.   LOS: 5 days    Cassell Clement 08/08/2012, 8:45 AM

## 2012-08-08 NOTE — Progress Notes (Signed)
Husband requested that wife was wanting to speak with me in the room privately.   Wife is a Psychologist, sport and exercise and advised me that she wants her husband of 33 years [name] to be her sole HPOA. Her husband was present when she made her wishes known and he has agreed to become the HPOA.   Phone call placed to Chaplain on call to complete paperwork and [facility] will notarize with two non [employer] employees.

## 2012-08-08 NOTE — Progress Notes (Signed)
ANTIBIOTIC CONSULT NOTE - FOLLOW UP  Pharmacy Consult for Vancomycin/(Imipenem -MD) Indication: Febrile Neutropenia  Allergies  Allergen Reactions  . Olmesartan Medoxomil Cough    REACTION: ? if cough  . Tetracycline Hcl     Unknown reaction, too long for patient to remember   . Venlafaxine     REACTION: severe dry moouth  . Adhesive (Tape) Rash  Patient Measurements: Height: 5\' 4"  (162.6 cm) Weight: 185 lb 6.5 oz (84.1 kg) IBW/kg (Calculated) : 54.7   Vital Signs: Temp: 96.7 F (35.9 C) (03/30 0800) Temp src: Axillary (03/30 0800) BP: 109/69 mmHg (03/30 1000) Pulse Rate: 82 (03/30 1000)  Labs:  Recent Labs  08/06/12 0600 08/07/12 0530 08/07/12 0700 08/08/12 0340  WBC 2.1* 1.5*  --  3.1*  HGB 11.8* 8.3*  --  13.7  PLT 54* 33*  --  68*  CREATININE 0.60 0.44* 0.65 0.60   Estimated Creatinine Clearance: 80.5 ml/min (by C-G formula based on Cr of 0.6). No results found for this basename: VANCOTROUGH, Leodis Binet, VANCORANDOM, GENTTROUGH, GENTPEAK, GENTRANDOM, TOBRATROUGH, TOBRAPEAK, TOBRARND, AMIKACINPEAK, AMIKACINTROU, AMIKACIN,  in the last 72 hours   Microbiology: 3/27 blood: NGTD 3/27 urine: 100 K GNR 3/26 Cdiff - negatve 3/29 MRSA by PCR negative   Anti-infectives   Start     Dose/Rate Route Frequency Ordered Stop   08/07/12 1200  imipenem-cilastatin (PRIMAXIN) 500 mg in sodium chloride 0.9 % 100 mL IVPB     500 mg 200 mL/hr over 30 Minutes Intravenous 4 times per day 08/07/12 0838     08/05/12 1700  vancomycin (VANCOCIN) IVPB 1000 mg/200 mL premix     1,000 mg 200 mL/hr over 60 Minutes Intravenous Every 12 hours 08/05/12 1607     08/05/12 1600  imipenem-cilastatin (PRIMAXIN) 500 mg in sodium chloride 0.9 % 100 mL IVPB  Status:  Discontinued     500 mg 200 mL/hr over 30 Minutes Intravenous 3 times per day 08/05/12 1524 08/07/12 0838   08/04/12 1000  metroNIDAZOLE (FLAGYL) IVPB 500 mg  Status:  Discontinued     500 mg 100 mL/hr over 60 Minutes Intravenous  Every 8 hours 08/04/12 0830 08/05/12 0856      Assessment: 56 YOFS/P cycle 2 chemotherapy followed by neulasta, admitted with severe diarrhea, vomiting, dehydration, multiple electrolyte abnormalities, confusion and severe neutropenia  D#4 Vancomycin 1g IV q12/Imipenem 500mg  IV q6h   Scr wnl/stable, CrCl ~ 80 ml/min  WBC up, ANC 2  Afebrile  Goal of Therapy:  Vancomycin trough level 15-20 mcg/ml  Plan:  VT tonight  Gwen Her PharmD  [phone] 08/08/2012 10:39 AM

## 2012-08-08 NOTE — Progress Notes (Signed)
Chaplain has arrived on unit to start HPOA papers.

## 2012-08-09 LAB — CBC WITH DIFFERENTIAL/PLATELET
Basophils Absolute: 0.1 10*3/uL (ref 0.0–0.1)
Basophils Relative: 2 % — ABNORMAL HIGH (ref 0–1)
Eosinophils Relative: 0 % (ref 0–5)
HCT: 41.5 % (ref 36.0–46.0)
Hemoglobin: 13.9 g/dL (ref 12.0–15.0)
Lymphocytes Relative: 27 % (ref 12–46)
Lymphs Abs: 1.2 10*3/uL (ref 0.7–4.0)
MCV: 78.9 fL (ref 78.0–100.0)
Monocytes Relative: 11 % (ref 3–12)
Neutro Abs: 2.7 10*3/uL (ref 1.7–7.7)
RBC: 5.26 MIL/uL — ABNORMAL HIGH (ref 3.87–5.11)
WBC: 4.5 10*3/uL (ref 4.0–10.5)

## 2012-08-09 LAB — COMPREHENSIVE METABOLIC PANEL
ALT: 24 U/L (ref 0–35)
AST: 29 U/L (ref 0–37)
Albumin: 1.5 g/dL — ABNORMAL LOW (ref 3.5–5.2)
CO2: 15 mEq/L — ABNORMAL LOW (ref 19–32)
Chloride: 112 mEq/L (ref 96–112)
Creatinine, Ser: 0.73 mg/dL (ref 0.50–1.10)
Potassium: 4 mEq/L (ref 3.5–5.1)
Sodium: 139 mEq/L (ref 135–145)
Total Bilirubin: 0.7 mg/dL (ref 0.3–1.2)

## 2012-08-09 LAB — PHOSPHORUS: Phosphorus: 2.3 mg/dL (ref 2.3–4.6)

## 2012-08-09 LAB — MAGNESIUM: Magnesium: 1.5 mg/dL (ref 1.5–2.5)

## 2012-08-09 LAB — GLUCOSE, CAPILLARY
Glucose-Capillary: 185 mg/dL — ABNORMAL HIGH (ref 70–99)
Glucose-Capillary: 189 mg/dL — ABNORMAL HIGH (ref 70–99)

## 2012-08-09 MED ORDER — CIPROFLOXACIN IN D5W 400 MG/200ML IV SOLN
400.0000 mg | Freq: Two times a day (BID) | INTRAVENOUS | Status: DC
  Administered 2012-08-09 – 2012-08-11 (×6): 400 mg via INTRAVENOUS
  Filled 2012-08-09 (×7): qty 200

## 2012-08-09 MED ORDER — MAGIC MOUTHWASH
10.0000 mL | Freq: Four times a day (QID) | ORAL | Status: DC | PRN
  Filled 2012-08-09: qty 10

## 2012-08-09 NOTE — Progress Notes (Signed)
[name]   DOB:1955-08-07   BJ#:[id number]   HYQ#:[id number]  Subjective: [name] is considerably improved--very clear mentally. She is very lonely in the ICU, "no TV, no visitors." She is hungry. Denies pain, SOB, pleurisy, cough, abd cramps. Per nursing, she was very worried about dying in the ICU and completed documents to make sure her husband is HCPOA (there were problems w other famly memebrs when the patient's mother died). Husband not in room  Objective: 57 middle aged white woman examined in bed Filed Vitals:   08/09/12 0600  BP: 112/68  Pulse: 91  Temp:   Resp: 30    Body mass index is 31.81 kg/(m^2).  Intake/Output Summary (Last 24 hours) at 08/09/12 0749 Last data filed at 08/09/12 0607  Gross per 24 hour  Intake 3918.67 ml  Output   1600 ml  Net 2318.67 ml    Oropharynx shows no sores orno thrush  No peripheral adenopathy  Lungs clear -- auscultated anteriorly, poor excursion  Heart irregular rate, no murmur appreciated  Abdomen soft, NT, dereased BS  MSK minimal bilateral ankle edema  Neuro nonfocal, oriented to place and person, not time  Breast exam: deferred  CBG (last 3)   Recent Labs  08/08/12 1256 08/08/12 1657 08/08/12 2131  GLUCAP 131* 207* 188*     Labs:  Lab Results  Component Value Date   WBC 4.5 08/09/2012   HGB 13.9 08/09/2012   HCT 41.5 08/09/2012   MCV 78.9 08/09/2012   PLT 81* 08/09/2012   NEUTROABS 2.7 08/09/2012    @LASTCHEMISTRY @  Urine Studies No results found for this basename: UACOL, UAPR, USPG, UPH, UTP, UGL, UKET, UBIL, UHGB, UNIT, UROB, ULEU, UEPI, UWBC, URBC, UBAC, CAST, CRYS, UCOM, BILUA,  in the last 72 hours  Basic Metabolic Panel:  Recent Labs Lab 08/06/12 0600 08/07/12 0530 08/07/12 0700 08/08/12 0340 08/09/12 0350  NA 132* 102* 133* 137 139  K 5.3* 3.2* 4.7 4.5 4.0  CL 105 82* 107 111 112  CO2 15* 10* 15* 15* 15*  GLUCOSE 203* 1152* 192* 144* 168*  BUN 13 13 18 15 15   CREATININE 0.60 0.44* 0.65 0.60 0.73   CALCIUM 7.4* 4.8* 7.5* 7.3* 7.1*  MG 1.6 1.1* 1.7 1.6 1.5  PHOS 2.1* 1.6* 2.4 2.2* 2.3   GFR Estimated Creatinine Clearance: 80.5 ml/min (by C-G formula based on Cr of 0.73). Liver Function Tests:  Recent Labs Lab 08/06/12 0600 08/07/12 0530 08/07/12 0700 08/08/12 0340 08/09/12 0350  AST 17 24 39* 52* 29  ALT 16 15 24 29 24   ALKPHOS 78 56 90 128* 158*  BILITOT 0.9 0.5 0.7 0.8 0.7  PROT 4.4* 2.8* 4.3* 4.1* 4.2*  ALBUMIN 1.5* 1.0* 1.5* 1.5* 1.5*   No results found for this basename: LIPASE, AMYLASE,  in the last 168 hours No results found for this basename: AMMONIA,  in the last 168 hours Coagulation profile No results found for this basename: INR, PROTIME,  in the last 168 hours  CBC:  Recent Labs Lab 08/05/12 0500 08/06/12 0600 08/07/12 0530 08/08/12 0340 08/09/12 0350  WBC 1.0* 2.1* 1.5* 3.1* 4.5  NEUTROABS 0.6* 1.6* 1.5* 2.0 2.7  HGB 11.0* 11.8* 8.3* 13.7 13.9  HCT 32.3* 35.2* 25.1* 40.6 41.5  MCV 76.4* 77.4* 78.4 78.4 78.9  PLT 50* 54* 33* 68* 81*   Cardiac Enzymes: No results found for this basename: CKTOTAL, CKMB, CKMBINDEX, TROPONINI,  in the last 168 hours BNP: No components found with this basename: POCBNP,  CBG:  Recent Labs Lab 08/07/12 2203 08/08/12 0728 08/08/12 1256 08/08/12 1657 08/08/12 2131  GLUCAP 128* 156* 131* 207* 188*   D-Dimer No results found for this basename: DDIMER,  in the last 72 hours Hgb A1c No results found for this basename: HGBA1C,  in the last 72 hours Lipid Profile No results found for this basename: CHOL, HDL, LDLCALC, TRIG, CHOLHDL, LDLDIRECT,  in the last 72 hours Thyroid function studies No results found for this basename: TSH, T4TOTAL, FREET3, T3FREE, THYROIDAB,  in the last 72 hours Anemia work up No results found for this basename: VITAMINB12, FOLATE, FERRITIN, TIBC, IRON, RETICCTPCT,  in the last 72 hours Microbiology Recent Results (from the past 240 hour(s))  CLOSTRIDIUM DIFFICILE BY PCR     Status:  None   Collection Time    08/04/12  9:03 AM      Result Value Range Status   C difficile by pcr NEGATIVE  NEGATIVE Final  URINE CULTURE     Status: None   Collection Time    08/05/12  3:45 PM      Result Value Range Status   Specimen Description URINE, CATHETERIZED   Final   Special Requests Immunocompromised   Final   Culture  Setup Time 08/06/2012 01:21   Final   Colony Count >=100,000 COLONIES/ML   Final   Culture     Final   Value: ESCHERICHIA COLI     KLEBSIELLA PNEUMONIAE   Report Status 08/08/2012 FINAL   Final   Organism ID, Bacteria ESCHERICHIA COLI   Final   Organism ID, Bacteria KLEBSIELLA PNEUMONIAE   Final  CULTURE, BLOOD (ROUTINE X 2)     Status: None   Collection Time    08/05/12  4:00 PM      Result Value Range Status   Specimen Description BLOOD LEFT HAND   Final   Special Requests BOTTLES DRAWN AEROBIC AND ANAEROBIC 10CC   Final   Culture  Setup Time 08/05/2012 21:54   Final   Culture     Final   Value:        BLOOD CULTURE RECEIVED NO GROWTH TO DATE CULTURE WILL BE HELD FOR 5 DAYS BEFORE ISSUING A FINAL NEGATIVE REPORT   Report Status PENDING   Incomplete  CULTURE, BLOOD (ROUTINE X 2)     Status: None   Collection Time    08/05/12  4:03 PM      Result Value Range Status   Specimen Description BLOOD LEFT ARM   Final   Special Requests BOTTLES DRAWN AEROBIC AND ANAEROBIC 10CC   Final   Culture  Setup Time 08/05/2012 21:54   Final   Culture     Final   Value:        BLOOD CULTURE RECEIVED NO GROWTH TO DATE CULTURE WILL BE HELD FOR 5 DAYS BEFORE ISSUING A FINAL NEGATIVE REPORT   Report Status PENDING   Incomplete  MRSA PCR SCREENING     Status: None   Collection Time    08/07/12  3:14 PM      Result Value Range Status   MRSA by PCR NEGATIVE  NEGATIVE Final   Comment:            The GeneXpert MRSA Assay (FDA     approved for NASAL specimens     only), is one component of a     comprehensive MRSA colonization     surveillance program. It is not      intended to diagnose  MRSA     infection nor to guide or     monitor treatment for     MRSA infections.      Studies:  No results found.  Assessment/ Plan: 57 y.o. [name] currently day 6, cycle 2 chemotherapy and day 5 neulasta, admitted with severe diarrhea, vomiting, dehydration, multiple electrolyte abnormalities, confusion and severe neutropenia    (1) status post right mastectomy under the care of Dr. 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 Lymphovascular and appearing neural invasion was identified. 2 of 18 lymph nodes were involved. Margins were clear. Pathologic staging pT3, pN1a, stage IIIA. (Note the original clinical staging was IIB.)   (2) being treated in the adjuvant setting with docetaxel/cyclophosphamide given every 3 weeks, with Neulasta on day 2 for granulocyte support. First treatment was given on 07/06/2012. The original goal was to complete a total of 6 cycles, but due to in tolerance, this has been decreased to 4 cycles, and we also decreased her doses by 15% beginning with cycle 2, given 07/29/2012 .  (3) patient will need postmastectomy radiation, which will be followed by antiestrogen therapy.   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   (5) diabetes-- currently on SSI  (6) atrial fib.--she appears not to be absorbing her cardizem and beta blocker well; I have increased the beta blocker dose slightly; TSH < 1.0, also dropped the synthroid dose-- now on cardizem drip, will d/c po cardizem  (7) fever-neutropenia: day 3 primaxin/ vanco; neutrophils now recovered; urine growing klebsiella and e coli, both cipro sensitive  (8) diarrhea: c diff negative; continue to replace fluids and electrolytes; on octreotide  (9) Full code. Following labs  daily. Will d/c flexiseal once voulme of diarrhea decreases to <500 cc/day. Hope for d/c to home next week.  (10) nutrition: tells me she's hungry; mouth remains sensitive; add MMW and advance diet   MAGRINAT,GUSTAV C 08/09/2012

## 2012-08-09 NOTE — Progress Notes (Signed)
Spoke with pt, continues to refuse CPAP. RT will assist as needed.

## 2012-08-09 NOTE — Progress Notes (Signed)
CARE MANAGEMENT NOTE 08/09/2012  Patient:  [name], ARMON   Account Number:  [id number]  Date Initiated:  08/09/2012  Documentation initiated by:  Cash Duce  Subjective/Objective Assessment:   pt transferred from med floor on [id number] after rapid resonse and found to have a.fib with rvr, ams     Action/Plan:   home   Anticipated DC Date:  08/12/2012   Anticipated DC Plan:  HOME/SELF CARE  In-house referral  NA      DC Planning Services  NA      [facility] Choice  NA   Choice offered to / List presented to:  NA   DME arranged  NA      DME agency  NA     HH arranged  NA      HH agency  NA   Status of service:  In process, will continue to follow Medicare Important Message given?  NA - LOS <3 / Initial given by admissions (If response is "NO", the following Medicare IM given date fields will be blank) Date Medicare IM given:   Date Additional Medicare IM given:    Discharge Disposition:    Per UR Regulation:    If discussed at Long Length of Stay Meetings, dates discussed:    Comments:  [id number]/Gwyn Mehring Earlene Plater, RN, BSN, CCM:  CHART REVIEWED AND UPDATED.  Next chart review due on [id number]. NO DISCHARGE NEEDS PRESENT AT THIS TIME. CASE MANAGEMENT [phone]

## 2012-08-09 NOTE — Progress Notes (Signed)
[name] is a 57 yo with a recent Dx of breast cancer, s/p Right mastectomy, getting chemotherapy.   She was admitted 3/25 with nausea, vomitting, and dehydration.  Subjective:  Patient was moved to telemetry yesterday after having sustained rapid AFassociated with being less responsive.   Rate still 120s this am but has not yet had any of her morning pills. Has mouth soreness and refusing po meds. Denies chest pain or dyspnea.  Objective:  Vital Signs in the last 24 hours: Temp:  [96.7 F (35.9 C)-98 F (36.7 C)] 98 F (36.7 C) (03/31 0000) Pulse Rate:  [48-101] 91 (03/31 0600) Resp:  [18-32] 30 (03/31 0600) BP: (93-133)/(42-96) 112/68 mmHg (03/31 0600) SpO2:  [95 %-100 %] 95 % (03/31 0600)  Intake/Output from previous day: 03/30 0701 - 03/31 0700 In: 3918.7 [I.V.:3118.7; IV Piggyback:800] Out: 1600 [Urine:500; Stool:1100] Intake/Output from this shift:    . antiseptic oral rinse  15 mL Mouth Rinse BID  . aspirin EC  81 mg Oral q morning - 10a  . atorvastatin  10 mg Oral QHS  . buPROPion  150 mg Oral q morning - 10a  . digoxin  0.25 mg Intravenous Daily  . diltiazem  120 mg Oral q morning - 10a  . escitalopram  10 mg Oral QHS  . imipenem-cilastatin  500 mg Intravenous Q6H  . insulin aspart  0-15 Units Subcutaneous TID WC  . levothyroxine  100 mcg Oral QAC breakfast  . metoprolol succinate  100 mg Oral Q breakfast  . multivitamin with minerals  1 tablet Oral Daily  . octreotide  100 mcg Subcutaneous Q12H  . pantoprazole (PROTONIX) IV  40 mg Intravenous QHS  . prochlorperazine  5 mg Intravenous QID  . saccharomyces boulardii  250 mg Oral BID  . vancomycin  1,000 mg Intravenous Q12H   . diltiazem (CARDIZEM) infusion 10 mg/hr (08/09/12 0720)  . 0.9 % sodium chloride with kcl 125 mL/hr at 08/08/12 1649    Physical Exam: The patient appears to be chronically ell  Head and neck exam : unremarkable  Chest is clear to percussion and auscultation.  No rales or rhonchi.   IV port in left upper chest  Heart :  Irreg. Irreg.  tachy  The abdomen is soft and nontender.  Bowel sounds are normoactive.  There is no hepatosplenomegaly or mass.  There are no abdominal bruits.  Extremities reveal no phlebitis or edema.  Pedal pulses are good.  There is no cyanosis or clubbing.  Neurologic exam is normal strength and no lateralizing weakness.  No sensory deficits.  Integument reveals no rash  Lab Results:  Recent Labs  08/08/12 0340 08/09/12 0350  WBC 3.1* 4.5  HGB 13.7 13.9  PLT 68* 81*    Recent Labs  08/08/12 0340 08/09/12 0350  NA 137 139  K 4.5 4.0  CL 111 112  CO2 15* 15*  GLUCOSE 144* 168*  BUN 15 15  CREATININE 0.60 0.73   No results found for this basename: TROPONINI, CK, MB,  in the last 72 hours Hepatic Function Panel  Recent Labs  08/09/12 0350  PROT 4.2*  ALBUMIN 1.5*  AST 29  ALT 24  ALKPHOS 158*  BILITOT 0.7   No results found for this basename: CHOL,  in the last 72 hours No results found for this basename: PROTIME,  in the last 72 hours  Imaging: No results found.  Cardiac Studies: Telemetry 3/31:   rapid atrial fib.  Assessment/Plan:  1.  Atrial fib with rapid ventricular response. 2. Anemia improved after 2 units of packed cells this weekend. 3. Breast cancer, chemotherapy. 4. Sore mouth from chemo, refusing po meds  Plan: will continue IV lanoxin for rate control for now.  Change cardizem to IV since unable to take po.  Continue PRN metoprolol IV for rate control.   LOS: 6 days    [name]. 08/09/2012, 7:25 AM

## 2012-08-10 LAB — CBC WITH DIFFERENTIAL/PLATELET
Basophils Absolute: 0 10*3/uL (ref 0.0–0.1)
HCT: 39.9 % (ref 36.0–46.0)
Lymphocytes Relative: 22 % (ref 12–46)
Lymphs Abs: 1 10*3/uL (ref 0.7–4.0)
Neutro Abs: 2.9 10*3/uL (ref 1.7–7.7)
Platelets: 68 10*3/uL — ABNORMAL LOW (ref 150–400)
RBC: 5.05 MIL/uL (ref 3.87–5.11)
RDW: 19.1 % — ABNORMAL HIGH (ref 11.5–15.5)
WBC: 4.2 10*3/uL (ref 4.0–10.5)

## 2012-08-10 LAB — BASIC METABOLIC PANEL
CO2: 16 mEq/L — ABNORMAL LOW (ref 19–32)
Calcium: 7.2 mg/dL — ABNORMAL LOW (ref 8.4–10.5)
GFR calc Af Amer: 81 mL/min — ABNORMAL LOW (ref >90)
Sodium: 131 mEq/L — ABNORMAL LOW (ref 135–145)

## 2012-08-10 LAB — GLUCOSE, CAPILLARY
Glucose-Capillary: 154 mg/dL — ABNORMAL HIGH (ref 70–99)
Glucose-Capillary: 240 mg/dL — ABNORMAL HIGH (ref 70–99)

## 2012-08-10 LAB — SODIUM, URINE, RANDOM: Sodium, Ur: <10 mEq/L

## 2012-08-10 LAB — CREATININE, URINE, RANDOM: Creatinine, Urine: 141.63 mg/dL

## 2012-08-10 MED ORDER — DILTIAZEM HCL ER COATED BEADS 240 MG PO CP24
240.0000 mg | ORAL_CAPSULE | Freq: Every day | ORAL | Status: DC
  Administered 2012-08-10: 240 mg via ORAL
  Filled 2012-08-10 (×2): qty 1

## 2012-08-10 MED ORDER — ENOXAPARIN SODIUM 30 MG/0.3ML ~~LOC~~ SOLN
40.0000 mg | Freq: Every day | SUBCUTANEOUS | Status: DC
  Administered 2012-08-10 – 2012-08-11 (×2): 40 mg via SUBCUTANEOUS
  Filled 2012-08-10 (×3): qty 0.4

## 2012-08-10 MED ORDER — DIGOXIN 125 MCG PO TABS: 0.1250 mg | ORAL_TABLET | Freq: Every day | ORAL | Status: DC

## 2012-08-10 MED ORDER — SODIUM CHLORIDE 0.9 % IV SOLN
INTRAVENOUS | Status: DC
  Administered 2012-08-10: 1000 mL via INTRAVENOUS

## 2012-08-10 NOTE — Progress Notes (Signed)
PIV LFA removed distal area to PIV was ecchymotic, capillary refill <3 seconds.

## 2012-08-10 NOTE — Progress Notes (Signed)
Dr. [name] paged to inform of low urine output(100 cc) over past 8 hrs and of low sbp 89/54.Adisa Litt, Georga Hacking, RN

## 2012-08-10 NOTE — Progress Notes (Signed)
Notified Dr. Vassie Loll in e-link of continued low bp and low output. Orders received and initiated. (2 attempts to reach Dr. [name] in cancer center and by pager  Unsuccessful).  Lynsay Fesperman, Georga Hacking, RN

## 2012-08-10 NOTE — Progress Notes (Signed)
[name] is a 56 yo with a recent Dx of breast cancer, s/p Right mastectomy, getting chemotherapy.   She was admitted 3/25 with nausea, vomitting, and dehydration.  Subjective:  Patient was moved to telemetry yesterday after having sustained rapid AFassociated with being less responsive.   Rate still 120s this am but has not yet had any of her morning pills. Has mouth soreness and refusing po meds. Denies chest pain or dyspnea.  Objective:  Vital Signs in the last 24 hours: Temp:  [97.1 F (36.2 C)-98.1 F (36.7 C)] 97.9 F (36.6 C) (04/01 0400) Pulse Rate:  [64-126] 87 (04/01 0600) Resp:  [19-26] 22 (04/01 0600) BP: (84-121)/(43-86) 107/55 mmHg (04/01 0600) SpO2:  [93 %-97 %] 96 % (04/01 0600)  Intake/Output from previous day: 03/31 0701 - 04/01 0700 In: 1982.5 [P.O.:90; I.V.:1492.5; IV Piggyback:400] Out: 1115 [Urine:490; Stool:625] Intake/Output from this shift:    . antiseptic oral rinse  15 mL Mouth Rinse BID  . aspirin EC  81 mg Oral q morning - 10a  . atorvastatin  10 mg Oral QHS  . buPROPion  150 mg Oral q morning - 10a  . ciprofloxacin  400 mg Intravenous Q12H  . digoxin  0.25 mg Intravenous Daily  . escitalopram  10 mg Oral QHS  . insulin aspart  0-15 Units Subcutaneous TID WC  . levothyroxine  100 mcg Oral QAC breakfast  . metoprolol succinate  100 mg Oral Q breakfast  . multivitamin with minerals  1 tablet Oral Daily  . octreotide  100 mcg Subcutaneous Q12H  . pantoprazole (PROTONIX) IV  40 mg Intravenous QHS  . prochlorperazine  5 mg Intravenous QID  . saccharomyces boulardii  250 mg Oral BID   . diltiazem (CARDIZEM) infusion 10 mg/hr (08/10/12 0045)  . 0.9 % sodium chloride with kcl 50 mL/hr at 08/10/12 0044    Physical Exam: The patient appears to be chronically ell  Head and neck exam : unremarkable  Chest is clear to percussion and auscultation.  No rales or rhonchi.  IV port in left upper chest  Heart :  Irreg. Irreg.  tachy  The abdomen is  soft and nontender.  Bowel sounds are normoactive.  There is no hepatosplenomegaly or mass.  There are no abdominal bruits.  Extremities reveal no phlebitis or edema.  Pedal pulses are good.  There is no cyanosis or clubbing.  Neurologic exam is normal strength and no lateralizing weakness.  No sensory deficits.  Integument reveals no rash  Lab Results:  Recent Labs  08/09/12 0350 08/10/12 0425  WBC 4.5 4.2  HGB 13.9 13.4  PLT 81* 68*    Recent Labs  08/08/12 0340 08/09/12 0350  NA 137 139  K 4.5 4.0  CL 111 112  CO2 15* 15*  GLUCOSE 144* 168*  BUN 15 15  CREATININE 0.60 0.73   No results found for this basename: TROPONINI, CK, MB,  in the last 72 hours Hepatic Function Panel  Recent Labs  08/09/12 0350  PROT 4.2*  ALBUMIN 1.5*  AST 29  ALT 24  ALKPHOS 158*  BILITOT 0.7   No results found for this basename: CHOL,  in the last 72 hours No results found for this basename: PROTIME,  in the last 72 hours  Imaging: No results found.  Cardiac Studies: Telemetry 3/31:   rapid atrial fib.  Assessment/Plan:  1. Atrial fib with rapid ventricular response.   Her HR is better at this point.  Will start  cardizem 240 daily.  Will DC the drip at 12 noon today. Will DC digoxin.    She is on Toprol XL  - may increase dose if she continues to have NSVT .  These episodes are asymptomatic at this point.    2. Anemia improved after 2 units of packed cells this weekend. 3. Breast cancer, chemotherapy. 4. Sore mouth from chemo, she was refusing po meds.  She says she will eat now.    LOS: 7 days    [name]. 08/10/2012, 8:02 AM

## 2012-08-10 NOTE — Progress Notes (Signed)
eLink Physician-Brief Progress Note Patient Name: [name] DOB: [birth year] MRN: [id number]  Date of Service  08/10/2012   HPI/Events of Note   Soft BP, low UO 100/last 8h  eICU Interventions  CHK BMET, FENa, bladder scan Start NS 75/h - ongoing losses with diarrhea - note 8L positive   Intervention Category Intermediate Interventions: Oliguria - evaluation and management  Matej Sappenfield V. 08/10/2012, 4:26 PM

## 2012-08-10 NOTE — Progress Notes (Signed)
Bladder scan revealed 345cc in bladder; Dr. Vassie Loll informed. Foley flushed with 60cc NS with full return of irrigant with sediment. Improved drainage of foley noted.[name]

## 2012-08-10 NOTE — Progress Notes (Signed)
[name]   DOB:1956-03-03   NW#:[id number]   MVH#:[id number]  Subjective: [name] is alert, says she can't eat "sitting this way." She wants to get "out of intensive care," be able to walk to BR, etc. She is still confused and does not know for instance that it is early morning--she thinks it's late in the day. Denies pain, h/a, nausea or vomiting. No cough, phlegm, SOB or pleurisy. Husband in room requests copies of dictations for his insurance  Objective: middle aged white woman examined in bed Filed Vitals:   08/10/12 0800  BP:   Pulse:   Temp: 96.9 F (36.1 [name])  Resp:     Body mass index is 31.81 kg/(m^2).  Intake/Output Summary (Last 24 hours) at 08/10/12 0855 Last data filed at 08/10/12 0600  Gross per 24 hour  Intake 1757.5 ml  Output   1115 ml  Net  642.5 ml   HR 88, BP 113/72  Oropharynx clear  No peripheral adenopathy  Lungs auscultated anteriorly, poor excursion, no rales or wheezes  Heart irregular rate, no murmur appreciated  Abdomen soft, NT, dereased BS  MSK bilateral ankle edema  Neuro nonfocal, oriented to place and person, not time  Breast exam: deferred  CBG (last 3)   Recent Labs  08/09/12 1731 08/09/12 2144 08/10/12 0825  GLUCAP 160* 126* 154*     Labs:  Lab Results  Component Value Date   WBC 4.2 08/10/2012   HGB 13.4 08/10/2012   HCT 39.9 08/10/2012   MCV 79.0 08/10/2012   PLT 68* 08/10/2012   NEUTROABS 2.9 08/10/2012    @LASTCHEMISTRY @  Urine Studies No results found for this basename: UACOL, UAPR, USPG, UPH, UTP, UGL, UKET, UBIL, UHGB, UNIT, UROB, ULEU, UEPI, UWBC, URBC, UBAC, CAST, CRYS, UCOM, BILUA,  in the last 72 hours  Basic Metabolic Panel:  Recent Labs Lab 08/06/12 0600 08/07/12 0530 08/07/12 0700 08/08/12 0340 08/09/12 0350  NA 132* 102* 133* 137 139  K 5.3* 3.2* 4.7 4.5 4.0  CL 105 82* 107 111 112  CO2 15* 10* 15* 15* 15*  GLUCOSE 203* 1152* 192* 144* 168*  BUN 13 13 18 15 15   CREATININE 0.60 0.44* 0.65 0.60 0.73   CALCIUM 7.4* 4.8* 7.5* 7.3* 7.1*  MG 1.6 1.1* 1.7 1.6 1.5  PHOS 2.1* 1.6* 2.4 2.2* 2.3   GFR Estimated Creatinine Clearance: 80.5 ml/min (by [name]-G formula based on Cr of 0.73). Liver Function Tests:  Recent Labs Lab 08/06/12 0600 08/07/12 0530 08/07/12 0700 08/08/12 0340 08/09/12 0350  AST 17 24 39* 52* 29  ALT 16 15 24 29 24   ALKPHOS 78 56 90 128* 158*  BILITOT 0.9 0.5 0.7 0.8 0.7  PROT 4.4* 2.8* 4.3* 4.1* 4.2*  ALBUMIN 1.5* 1.0* 1.5* 1.5* 1.5*   No results found for this basename: LIPASE, AMYLASE,  in the last 168 hours No results found for this basename: AMMONIA,  in the last 168 hours Coagulation profile No results found for this basename: INR, PROTIME,  in the last 168 hours  CBC:  Recent Labs Lab 08/06/12 0600 08/07/12 0530 08/08/12 0340 08/09/12 0350 08/10/12 0425  WBC 2.1* 1.5* 3.1* 4.5 4.2  NEUTROABS 1.6* 1.5* 2.0 2.7 2.9  HGB 11.8* 8.3* 13.7 13.9 13.4  HCT 35.2* 25.1* 40.6 41.5 39.9  MCV 77.4* 78.4 78.4 78.9 79.0  PLT 54* 33* 68* 81* 68*   Cardiac Enzymes: No results found for this basename: CKTOTAL, CKMB, CKMBINDEX, TROPONINI,  in the last 168 hours BNP:  No components found with this basename: POCBNP,  CBG:  Recent Labs Lab 08/09/12 0805 08/09/12 1230 08/09/12 1731 08/09/12 2144 08/10/12 0825  GLUCAP 185* 189* 160* 126* 154*   D-Dimer No results found for this basename: DDIMER,  in the last 72 hours Hgb A1c No results found for this basename: HGBA1C,  in the last 72 hours Lipid Profile No results found for this basename: CHOL, HDL, LDLCALC, TRIG, CHOLHDL, LDLDIRECT,  in the last 72 hours Thyroid function studies No results found for this basename: TSH, T4TOTAL, FREET3, T3FREE, THYROIDAB,  in the last 72 hours Anemia work up No results found for this basename: VITAMINB12, FOLATE, FERRITIN, TIBC, IRON, RETICCTPCT,  in the last 72 hours Microbiology Recent Results (from the past 240 hour(s))  CLOSTRIDIUM DIFFICILE BY PCR     Status: None    Collection Time    08/04/12  9:03 AM      Result Value Range Status   [name] difficile by pcr NEGATIVE  NEGATIVE Final  URINE CULTURE     Status: None   Collection Time    08/05/12  3:45 PM      Result Value Range Status   Specimen Description URINE, CATHETERIZED   Final   Special Requests Immunocompromised   Final   Culture  Setup Time 08/06/2012 01:21   Final   Colony Count >=100,000 COLONIES/ML   Final   Culture     Final   Value: ESCHERICHIA COLI     KLEBSIELLA PNEUMONIAE   Report Status 08/08/2012 FINAL   Final   Organism ID, Bacteria ESCHERICHIA COLI   Final   Organism ID, Bacteria KLEBSIELLA PNEUMONIAE   Final  CULTURE, BLOOD (ROUTINE X 2)     Status: None   Collection Time    08/05/12  4:00 PM      Result Value Range Status   Specimen Description BLOOD LEFT HAND   Final   Special Requests BOTTLES DRAWN AEROBIC AND ANAEROBIC 10CC   Final   Culture  Setup Time 08/05/2012 21:54   Final   Culture     Final   Value:        BLOOD CULTURE RECEIVED NO GROWTH TO DATE CULTURE WILL BE HELD FOR 5 DAYS BEFORE ISSUING A FINAL NEGATIVE REPORT   Report Status PENDING   Incomplete  CULTURE, BLOOD (ROUTINE X 2)     Status: None   Collection Time    08/05/12  4:03 PM      Result Value Range Status   Specimen Description BLOOD LEFT ARM   Final   Special Requests BOTTLES DRAWN AEROBIC AND ANAEROBIC 10CC   Final   Culture  Setup Time 08/05/2012 21:54   Final   Culture     Final   Value:        BLOOD CULTURE RECEIVED NO GROWTH TO DATE CULTURE WILL BE HELD FOR 5 DAYS BEFORE ISSUING A FINAL NEGATIVE REPORT   Report Status PENDING   Incomplete  MRSA PCR SCREENING     Status: None   Collection Time    08/07/12  3:14 PM      Result Value Range Status   MRSA by PCR NEGATIVE  NEGATIVE Final   Comment:            The GeneXpert MRSA Assay (FDA     approved for NASAL specimens     only), is one component of a     comprehensive MRSA colonization     surveillance program. It  is not     intended  to diagnose MRSA     infection nor to guide or     monitor treatment for     MRSA infections.      Studies:  No results found.  Assessment/ Plan: 57 y.o. [name] currently day 12, cycle 2 chemotherapy, admitted with severe diarrhea, vomiting, dehydration, multiple electrolyte abnormalities, confusion and severe neutropenia with fever   (1) status post right mastectomy under the care of Dr. 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 Lymphovascular and appearing neural invasion was identified. 2 of 18 lymph nodes were involved. Margins were clear. Pathologic staging pT3, pN1a, stage IIIA. (Note the original clinical staging was IIB.)   (2) being treated in the adjuvant setting with docetaxel/cyclophosphamide given every 3 weeks, with Neulasta on day 2 for granulocyte support. First treatment was given on 07/06/2012. The original goal was to complete a total of 6 cycles, but due to in tolerance, this has been decreased to 4 cycles, and we also decreased her doses by 15% beginning with cycle 2, given 07/29/2012 .  (3) patient will need postmastectomy radiation, which will be followed by antiestrogen therapy.   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   (5) diabetes-- currently on SSI--CBGs acceptable  (6) atrial fib.--controlled on IV cardizem--will try to wean today as per cardiology; has runs of what looked like VTach yesterday--follow  (7) fever-neutropenia: s/p3 days primaxin/ vanco, currently day 2 cipro (day 5 ABX); neutrophils now recovered; urine growing klebsiella and e coli, both cipro sensitive  (8) diarrhea: [name] diff negative; continue to replace fluids and electrolytes; on octreotide  (9) Full code. Following labs daily. Will d/[name] flexiseal once voulme of  diarrhea decreases to <500 cc/day (was 650 cc yesterday). May be able to transfer to regular floor tomorrow; may need Rehab, eval to follow  (10) nutrition: mouth remains sensitive; added MMW and advanced diet  (11) DVT prophylaxis: corrected     [name] 08/10/2012

## 2012-08-10 NOTE — Progress Notes (Signed)
Follow up support. Both patient and spouse were sleeping but awoke when I came in. Spouse is optimistic that she is "going to get better." She said she felt a little better today. She was encouraged by the visits she has had from friends she works with. She said, "I don't take any of them for granted." Will continue to follow.

## 2012-08-10 NOTE — Progress Notes (Signed)
Pt's flexiseal output during 24 hour period >500. Due to watery stool and redness around anal area from nursing standpoint / pt care it is felt that it would be best to leave the flexiseal. This will prevent further breakdown and irritation of peri skin and possible UTI from liquid stool in the labia area. . The flexiseal is not hindering pt movement and is not causing the pt any discomfort since it is above the rectal sphincter muscle.

## 2012-08-11 DIAGNOSIS — D702 Other drug-induced agranulocytosis: Secondary | ICD-10-CM

## 2012-08-11 DIAGNOSIS — I10 Essential (primary) hypertension: Secondary | ICD-10-CM

## 2012-08-11 DIAGNOSIS — R5081 Fever presenting with conditions classified elsewhere: Secondary | ICD-10-CM

## 2012-08-11 LAB — CULTURE, BLOOD (ROUTINE X 2): Culture: NO GROWTH

## 2012-08-11 LAB — CBC WITH DIFFERENTIAL/PLATELET
Basophils Absolute: 0 10*3/uL (ref 0.0–0.1)
Basophils Relative: 0 % (ref 0–1)
Eosinophils Absolute: 0 10*3/uL (ref 0.0–0.7)
Eosinophils Relative: 0 % (ref 0–5)
HCT: 38.5 % (ref 36.0–46.0)
Hemoglobin: 13.1 g/dL (ref 12.0–15.0)
MCH: 26.7 pg (ref 26.0–34.0)
MCHC: 34 g/dL (ref 30.0–36.0)
MCV: 78.6 fL (ref 78.0–100.0)
Monocytes Absolute: 0.3 10*3/uL (ref 0.1–1.0)
Monocytes Relative: 7 % (ref 3–12)
RDW: 19.4 % — ABNORMAL HIGH (ref 11.5–15.5)

## 2012-08-11 LAB — GLUCOSE, CAPILLARY
Glucose-Capillary: 138 mg/dL — ABNORMAL HIGH (ref 70–99)
Glucose-Capillary: 129 mg/dL — ABNORMAL HIGH (ref 70–99)

## 2012-08-11 LAB — COMPREHENSIVE METABOLIC PANEL
AST: 31 U/L (ref 0–37)
Albumin: 1.4 g/dL — ABNORMAL LOW (ref 3.5–5.2)
BUN: 17 mg/dL (ref 6–23)
Calcium: 7 mg/dL — ABNORMAL LOW (ref 8.4–10.5)
Creatinine, Ser: 0.87 mg/dL (ref 0.50–1.10)
Total Bilirubin: 0.6 mg/dL (ref 0.3–1.2)

## 2012-08-11 LAB — MAGNESIUM: Magnesium: 1.5 mg/dL (ref 1.5–2.5)

## 2012-08-11 LAB — PHOSPHORUS: Phosphorus: 3.2 mg/dL (ref 2.3–4.6)

## 2012-08-11 MED ORDER — CHOLESTYRAMINE 4 G PO PACK
4.0000 g | PACK | Freq: Two times a day (BID) | ORAL | Status: DC
  Administered 2012-08-11 – 2012-08-16 (×12): 4 g via ORAL
  Filled 2012-08-11 (×14): qty 1

## 2012-08-11 MED ORDER — DILTIAZEM HCL ER COATED BEADS 120 MG PO CP24
120.0000 mg | ORAL_CAPSULE | Freq: Every day | ORAL | Status: DC
  Administered 2012-08-11 – 2012-08-17 (×5): 120 mg via ORAL
  Filled 2012-08-11 (×7): qty 1

## 2012-08-11 MED ORDER — SODIUM CHLORIDE 0.9 % IV BOLUS (SEPSIS)
300.0000 mL | Freq: Once | INTRAVENOUS | Status: AC
  Administered 2012-08-11: 300 mL via INTRAVENOUS

## 2012-08-11 NOTE — Progress Notes (Signed)
DALIA JOLLIE   DOB:[birth year]   AV#:[id number]   NWG#:[id number]  Subjective: [name] is alert, agitating to get to regular floor. Eating continues to be an issue and particularly worries husband. She denies pain, SOB, h/a, N/V or other acute symptoms; husband in room  Objective: middle aged white woman examined in bed Filed Vitals:   08/11/12 0700  BP: 94/48  Pulse: 73  Temp:   Resp: 18    Body mass index is 31.81 kg/(m^2).  Intake/Output Summary (Last 24 hours) at 08/11/12 0758 Last data filed at 08/11/12 0700  Gross per 24 hour  Intake 2158.75 ml  Output   1290 ml  Net 868.75 ml   HR 90, irreg, BP 105/58  Oropharynx clear  No peripheral adenopathy  Lungs auscultated anteriorly, poor excursion, no rales or wheezes  Heart irregular rate, no murmur appreciated  Abdomen soft, NT, dereased BS  MSK bilateral ankle edema  Neuro nonfocal, oriented x3 (finally got to 2014!)  Breast exam: deferred  CBG (last 3)   Recent Labs  08/10/12 0825 08/10/12 1213 08/10/12 1619  GLUCAP 154* 240* 154*     Labs:  Lab Results  Component Value Date   WBC 4.6 08/11/2012   HGB 13.1 08/11/2012   HCT 38.5 08/11/2012   MCV 78.6 08/11/2012   PLT 72* 08/11/2012   NEUTROABS 3.5 08/11/2012    @LASTCHEMISTRY @  Urine Studies No results found for this basename: UACOL, UAPR, USPG, UPH, UTP, UGL, UKET, UBIL, UHGB, UNIT, UROB, ULEU, UEPI, UWBC, URBC, UBAC, CAST, CRYS, UCOM, BILUA,  in the last 72 hours  Basic Metabolic Panel:  Recent Labs Lab 08/06/12 0600 08/07/12 0530 08/07/12 0700 08/08/12 0340 08/09/12 0350 08/10/12 1700  NA 132* 102* 133* 137 139 131*  K 5.3* 3.2* 4.7 4.5 4.0 3.9  CL 105 82* 107 111 112 107  CO2 15* 10* 15* 15* 15* 16*  GLUCOSE 203* 1152* 192* 144* 168* 128*  BUN 13 13 18 15 15 17   CREATININE 0.60 0.44* 0.65 0.60 0.73 0.89  CALCIUM 7.4* 4.8* 7.5* 7.3* 7.1* 7.2*  MG 1.6 1.1* 1.7 1.6 1.5  --   PHOS 2.1* 1.6* 2.4 2.2* 2.3  --    GFR Estimated Creatinine Clearance: 72.3  ml/min (by C-G formula based on Cr of 0.89). Liver Function Tests:  Recent Labs Lab 08/06/12 0600 08/07/12 0530 08/07/12 0700 08/08/12 0340 08/09/12 0350  AST 17 24 39* 52* 29  ALT 16 15 24 29 24   ALKPHOS 78 56 90 128* 158*  BILITOT 0.9 0.5 0.7 0.8 0.7  PROT 4.4* 2.8* 4.3* 4.1* 4.2*  ALBUMIN 1.5* 1.0* 1.5* 1.5* 1.5*   No results found for this basename: LIPASE, AMYLASE,  in the last 168 hours No results found for this basename: AMMONIA,  in the last 168 hours Coagulation profile No results found for this basename: INR, PROTIME,  in the last 168 hours  CBC:  Recent Labs Lab 08/07/12 0530 08/08/12 0340 08/09/12 0350 08/10/12 0425 08/11/12 0420  WBC 1.5* 3.1* 4.5 4.2 4.6  NEUTROABS 1.5* 2.0 2.7 2.9 3.5  HGB 8.3* 13.7 13.9 13.4 13.1  HCT 25.1* 40.6 41.5 39.9 38.5  MCV 78.4 78.4 78.9 79.0 78.6  PLT 33* 68* 81* 68* 72*   Cardiac Enzymes: No results found for this basename: CKTOTAL, CKMB, CKMBINDEX, TROPONINI,  in the last 168 hours BNP: No components found with this basename: POCBNP,  CBG:  Recent Labs Lab 08/09/12 1731 08/09/12 2144 08/10/12 0825 08/10/12 1213 08/10/12 1619  GLUCAP 160* 126* 154* 240* 154*   D-Dimer No results found for this basename: DDIMER,  in the last 72 hours Hgb A1c No results found for this basename: HGBA1C,  in the last 72 hours Lipid Profile No results found for this basename: CHOL, HDL, LDLCALC, TRIG, CHOLHDL, LDLDIRECT,  in the last 72 hours Thyroid function studies No results found for this basename: TSH, T4TOTAL, FREET3, T3FREE, THYROIDAB,  in the last 72 hours Anemia work up No results found for this basename: VITAMINB12, FOLATE, FERRITIN, TIBC, IRON, RETICCTPCT,  in the last 72 hours Microbiology Recent Results (from the past 240 hour(s))  CLOSTRIDIUM DIFFICILE BY PCR     Status: None   Collection Time    08/04/12  9:03 AM      Result Value Range Status   C difficile by pcr NEGATIVE  NEGATIVE Final  URINE CULTURE      Status: None   Collection Time    08/05/12  3:45 PM      Result Value Range Status   Specimen Description URINE, CATHETERIZED   Final   Special Requests Immunocompromised   Final   Culture  Setup Time 08/06/2012 01:21   Final   Colony Count >=100,000 COLONIES/ML   Final   Culture     Final   Value: ESCHERICHIA COLI     KLEBSIELLA PNEUMONIAE   Report Status 08/08/2012 FINAL   Final   Organism ID, Bacteria ESCHERICHIA COLI   Final   Organism ID, Bacteria KLEBSIELLA PNEUMONIAE   Final  CULTURE, BLOOD (ROUTINE X 2)     Status: None   Collection Time    08/05/12  4:00 PM      Result Value Range Status   Specimen Description BLOOD LEFT HAND   Final   Special Requests BOTTLES DRAWN AEROBIC AND ANAEROBIC 10CC   Final   Culture  Setup Time 08/05/2012 21:54   Final   Culture     Final   Value:        BLOOD CULTURE RECEIVED NO GROWTH TO DATE CULTURE WILL BE HELD FOR 5 DAYS BEFORE ISSUING A FINAL NEGATIVE REPORT   Report Status PENDING   Incomplete  CULTURE, BLOOD (ROUTINE X 2)     Status: None   Collection Time    08/05/12  4:03 PM      Result Value Range Status   Specimen Description BLOOD LEFT ARM   Final   Special Requests BOTTLES DRAWN AEROBIC AND ANAEROBIC 10CC   Final   Culture  Setup Time 08/05/2012 21:54   Final   Culture     Final   Value:        BLOOD CULTURE RECEIVED NO GROWTH TO DATE CULTURE WILL BE HELD FOR 5 DAYS BEFORE ISSUING A FINAL NEGATIVE REPORT   Report Status PENDING   Incomplete  MRSA PCR SCREENING     Status: None   Collection Time    08/07/12  3:14 PM      Result Value Range Status   MRSA by PCR NEGATIVE  NEGATIVE Final   Comment:            The GeneXpert MRSA Assay (FDA     approved for NASAL specimens     only), is one component of a     comprehensive MRSA colonization     surveillance program. It is not     intended to diagnose MRSA     infection nor to guide or     monitor  treatment for     MRSA infections.      Studies:  No results  found.  Assessment/ Plan: 57 y.o. [name] currently day 12, cycle 2 chemotherapy, admitted with severe diarrhea, vomiting, dehydration, multiple electrolyte abnormalities, confusion and severe neutropenia with fever   (1) status post right mastectomy under the care of Dr. 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 Lymphovascular and appearing neural invasion was identified. 2 of 18 lymph nodes were involved. Margins were clear. Pathologic staging pT3, pN1a, stage IIIA. (Note the original clinical staging was IIB.)   (2) being treated in the adjuvant setting with docetaxel/cyclophosphamide given every 3 weeks, with Neulasta on day 2 for granulocyte support. First treatment was given on 07/06/2012. The original goal was to complete a total of 6 cycles, but due to in tolerance, this has been decreased to 4 cycles, and we also decreased her doses by 15% beginning with cycle 2, given 07/29/2012 .  (3) patient will need postmastectomy radiation, which will be followed by antiestrogen therapy.   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   (5) diabetes-- currently on SSI--CBGs acceptable  (6) atrial fib.--controlled on cardizem and beta blocker--appreciate cardiology's help!  (7) fever-neutropenia: s/p3 days primaxin/ vanco, currently day 3 cipro (day 6 ABX); neutrophils now recovered; urine grew klebsiella and e coli, both cipro sensitive  (8) diarrhea: c diff negative; still putting out 600-700 cc/day via flexiseal;continue to replace fluids and electrolytes; d/c octreotide; try questran  (9) Full code. Following labs daily. Will d/c flexiseal once voulme of diarrhea decreases to <500 cc/day (was 650 cc yesterday). Will transfer to regular floor today; will getRehabto assess;   (10)  nutrition remains an issue--start calorie counts  (10) nutrition: mouth remains sensitive; added MMW and advanced diet  (11) DVT prophylaxis: corrected     MAGRINAT,GUSTAV C 08/11/2012

## 2012-08-11 NOTE — Progress Notes (Signed)
Spoke with pt and family member at bedside regarding cpap.  Pt stated she doesn't want cpap/respiratory tonight.  Per family member at bedside, pt has been refusing to wear it for several days.  Both were advised that RT available all night and encouraged them to call/let her nurse know should pt change her mind.  RN notified.

## 2012-08-11 NOTE — Progress Notes (Signed)
Ongoing support for patient. Presence; listening. Patient is excited about doing therapy and going home. She talked about how sick the chemo made her. She has made peace with the prospect of dying at some time in the future. She and her spouse have done their POA and advanced planning. She has goals for when she feels stronger including some legacy work. Prayer.

## 2012-08-11 NOTE — Progress Notes (Signed)
Pt has been consistently refusing CPAP. RT will assist as needed.

## 2012-08-11 NOTE — Progress Notes (Signed)
Called MD on call for Dr [name] regarding decreased B/P. Dr Alphonzo Grieve gave a verbal order to NS 300 mL bolus.

## 2012-08-11 NOTE — Progress Notes (Signed)
Cardiologist:  Delane Ginger, MD   [name] is a 57 yo with a recent Dx of breast cancer, s/p Right mastectomy, getting chemotherapy.   She was admitted 3/25 with nausea, vomitting, and dehydration.  She was diagnosed with atrial fib recently - July 14, 2012.  Subjective:  Patient has been changed to PO diltiazem.  BP is a bit lower.     Objective:  Vital Signs in the last 24 hours: Temp:  [97.1 F (36.2 C)-97.7 F (36.5 C)] 97.2 F (36.2 C) (04/02 0400) Pulse Rate:  [45-96] 73 (04/02 0700) Resp:  [17-24] 18 (04/02 0700) BP: (71-102)/(38-62) 94/48 mmHg (04/02 0700) SpO2:  [94 %-97 %] 96 % (04/02 0700)  Intake/Output from previous day: 04/01 0701 - 04/02 0700 In: 2158.8 [P.O.:330; I.V.:1428.8; IV Piggyback:400] Out: 1290 [Urine:640; Stool:650] Intake/Output from this shift:    . antiseptic oral rinse  15 mL Mouth Rinse BID  . aspirin EC  81 mg Oral q morning - 10a  . atorvastatin  10 mg Oral QHS  . buPROPion  150 mg Oral q morning - 10a  . ciprofloxacin  400 mg Intravenous Q12H  . diltiazem  240 mg Oral Daily  . enoxaparin  40 mg Subcutaneous Daily  . escitalopram  10 mg Oral QHS  . insulin aspart  0-15 Units Subcutaneous TID WC  . levothyroxine  100 mcg Oral QAC breakfast  . metoprolol succinate  100 mg Oral Q breakfast  . multivitamin with minerals  1 tablet Oral Daily  . pantoprazole (PROTONIX) IV  40 mg Intravenous QHS  . prochlorperazine  5 mg Intravenous QID  . saccharomyces boulardii  250 mg Oral BID   . 0.9 % sodium chloride with kcl 50 mL/hr at 08/10/12 0044    Physical Exam: The patient appears to be chronically ill  Head and neck exam : unremarkable Chest is clear to percussion and auscultation.  No rales or rhonchi.  IV port in left upper chest Heart :  Irreg. Irreg.  Rate is better  The abdomen is soft and nontender.  Bowel sounds are normoactive.  There is no hepatosplenomegaly or mass.  There are no abdominal bruits.  Extremities reveal no  phlebitis or edema.  Pedal pulses are good.  There is no cyanosis or clubbing.  Neurologic exam is normal strength and no lateralizing weakness.  No sensory deficits.  Integument reveals no rash  Lab Results:  Recent Labs  08/10/12 0425 08/11/12 0420  WBC 4.2 4.6  HGB 13.4 13.1  PLT 68* 72*    Recent Labs  08/09/12 0350 08/10/12 1700  NA 139 131*  K 4.0 3.9  CL 112 107  CO2 15* 16*  GLUCOSE 168* 128*  BUN 15 17  CREATININE 0.73 0.89   No results found for this basename: TROPONINI, CK, MB,  in the last 72 hours Hepatic Function Panel  Recent Labs  08/09/12 0350  PROT 4.2*  ALBUMIN 1.5*  AST 29  ALT 24  ALKPHOS 158*  BILITOT 0.7   No results found for this basename: CHOL,  in the last 72 hours No results found for this basename: PROTIME,  in the last 72 hours  Imaging: No results found.  Cardiac Studies: Telemetry 3/31:   rapid atrial fib.  Assessment/Plan:  1. Atrial fib with rapid ventricular response.   HR is well controlled but her BP has dropped.  Will reduce her Cardizem to 120 daily.  OK to transfer to tele.    Currently, she has  a platelet count of 72. Her CHADS score is ~ 1 for HTN.  She eats a high salt diet.  I would continue aspirin for now and would consider xarelto as she improves.    She is on Toprol XL  - may increase dose if she continues to have NSVT .  These episodes are asymptomatic at this point.    2. Anemia improved after 2 units of packed cells this weekend. 3. Breast cancer, chemotherapy. 4. Sore mouth from chemo, she was refusing po meds.  She says she will eat now.    LOS: 8 days    [name]. 08/11/2012, 8:02 AM

## 2012-08-12 LAB — CBC WITH DIFFERENTIAL/PLATELET
Basophils Absolute: 0 10*3/uL (ref 0.0–0.1)
Basophils Relative: 0 % (ref 0–1)
Eosinophils Absolute: 0 10*3/uL (ref 0.0–0.7)
Eosinophils Relative: 0 % (ref 0–5)
Lymphocytes Relative: 14 % (ref 12–46)
MCV: 79.1 fL (ref 78.0–100.0)
Platelets: 61 10*3/uL — ABNORMAL LOW (ref 150–400)
RDW: 20.1 % — ABNORMAL HIGH (ref 11.5–15.5)
WBC: 4.9 10*3/uL (ref 4.0–10.5)

## 2012-08-12 LAB — COMPREHENSIVE METABOLIC PANEL
ALT: 19 U/L (ref 0–35)
Albumin: 1.3 g/dL — ABNORMAL LOW (ref 3.5–5.2)
Alkaline Phosphatase: 130 U/L — ABNORMAL HIGH (ref 39–117)
BUN: 16 mg/dL (ref 6–23)
Calcium: 6.7 mg/dL — ABNORMAL LOW (ref 8.4–10.5)
GFR calc Af Amer: >90 mL/min (ref >90)
Glucose, Bld: 127 mg/dL — ABNORMAL HIGH (ref 70–99)
Potassium: 3.7 mEq/L (ref 3.5–5.1)
Sodium: 133 mEq/L — ABNORMAL LOW (ref 135–145)
Total Protein: 3.9 g/dL — ABNORMAL LOW (ref 6.0–8.3)

## 2012-08-12 LAB — MAGNESIUM: Magnesium: 1.5 mg/dL (ref 1.5–2.5)

## 2012-08-12 LAB — GLUCOSE, CAPILLARY
Glucose-Capillary: 131 mg/dL — ABNORMAL HIGH (ref 70–99)
Glucose-Capillary: 143 mg/dL — ABNORMAL HIGH (ref 70–99)

## 2012-08-12 MED ORDER — BOOST / RESOURCE BREEZE PO LIQD
1.0000 | Freq: Two times a day (BID) | ORAL | Status: DC
  Administered 2012-08-13 – 2012-08-14 (×3): 1 via ORAL

## 2012-08-12 MED ORDER — ENOXAPARIN SODIUM 40 MG/0.4ML ~~LOC~~ SOLN
40.0000 mg | Freq: Every day | SUBCUTANEOUS | Status: DC
  Administered 2012-08-12 – 2012-08-17 (×6): 40 mg via SUBCUTANEOUS
  Filled 2012-08-12 (×6): qty 0.4

## 2012-08-12 NOTE — Progress Notes (Signed)
Pt continues to refuse cpap at night. She has not worn at night since she's been here.  Jacqulynn Cadet RRT

## 2012-08-12 NOTE — Progress Notes (Signed)
LACHERYL NIESEN   DOB:December 09, 1955   WU#:[id number]   VOZ#:[id number]  Subjective: Tristyn is very motivated but also very weak; her husband is bringing her some of the food she normally likes; she was surprised to find out how hard it was for her to lift her legs today while in bed and I have encouraged her to do in bed esxcercises while waiting for PT. No family in room  Objective: middle aged white woman examined in bed Filed Vitals:   08/12/12 0600  BP: 102/59  Pulse: 81  Temp: 97.8 F (36.6 C)  Resp: 16    Body mass index is 31.74 kg/(m^2).  Intake/Output Summary (Last 24 hours) at 08/12/12 0749 Last data filed at 08/12/12 0700  Gross per 24 hour  Intake 3242.5 ml  Output    800 ml  Net 2442.5 ml    Oropharynx clear  No peripheral adenopathy  Lungs auscultated anteriorly, no rales or wheezes  Heart irregular rate, no murmur appreciated  Abdomen soft, NT, positive BS  MSK bilateral 1+ ankle edema  Neuro nonfocal, oriented x3   Breast exam: deferred  Stool 400 cc/ 24 h; urine not accurately recorded  CBG (last 3)   Recent Labs  08/11/12 1718 08/11/12 2218 08/12/12 0734  GLUCAP 129* 133* 131*     Labs:  Lab Results  Component Value Date   WBC 4.9 08/12/2012   HGB 12.0 08/12/2012   HCT 35.5* 08/12/2012   MCV 79.1 08/12/2012   PLT 61* 08/12/2012   NEUTROABS 3.9 08/12/2012    @LASTCHEMISTRY @  Urine Studies No results found for this basename: UACOL, UAPR, USPG, UPH, UTP, UGL, UKET, UBIL, UHGB, UNIT, UROB, ULEU, UEPI, UWBC, URBC, UBAC, CAST, CRYS, UCOM, BILUA,  in the last 72 hours  Basic Metabolic Panel:  Recent Labs Lab 08/07/12 0700 08/08/12 0340 08/09/12 0350 08/10/12 1700 08/11/12 0915 08/12/12 0605  NA 133* 137 139 131* 133* 133*  K 4.7 4.5 4.0 3.9 3.8 3.7  CL 107 111 112 107 106 107  CO2 15* 15* 15* 16* 15* 15*  GLUCOSE 192* 144* 168* 128* 161* 127*  BUN 18 15 15 17 17 16   CREATININE 0.65 0.60 0.73 0.89 0.87 0.78  CALCIUM 7.5* 7.3* 7.1* 7.2* 7.0* 6.7*  MG  1.7 1.6 1.5  --  1.5 1.5  PHOS 2.4 2.2* 2.3  --  3.2 3.8   GFR Estimated Creatinine Clearance: 80.3 ml/min (by C-G formula based on Cr of 0.78). Liver Function Tests:  Recent Labs Lab 08/07/12 0700 08/08/12 0340 08/09/12 0350 08/11/12 0915 08/12/12 0605  AST 39* 52* 29 31 29   ALT 24 29 24 20 19   ALKPHOS 90 128* 158* 153* 130*  BILITOT 0.7 0.8 0.7 0.6 0.4  PROT 4.3* 4.1* 4.2* 4.1* 3.9*  ALBUMIN 1.5* 1.5* 1.5* 1.4* 1.3*   No results found for this basename: LIPASE, AMYLASE,  in the last 168 hours No results found for this basename: AMMONIA,  in the last 168 hours Coagulation profile No results found for this basename: INR, PROTIME,  in the last 168 hours  CBC:  Recent Labs Lab 08/08/12 0340 08/09/12 0350 08/10/12 0425 08/11/12 0420 08/12/12 0630  WBC 3.1* 4.5 4.2 4.6 4.9  NEUTROABS 2.0 2.7 2.9 3.5 3.9  HGB 13.7 13.9 13.4 13.1 12.0  HCT 40.6 41.5 39.9 38.5 35.5*  MCV 78.4 78.9 79.0 78.6 79.1  PLT 68* 81* 68* 72* 61*   Cardiac Enzymes: No results found for this basename: CKTOTAL, CKMB, CKMBINDEX, TROPONINI,  in the last 168 hours BNP: No components found with this basename: POCBNP,  CBG:  Recent Labs Lab 08/11/12 0756 08/11/12 1235 08/11/12 1718 08/11/12 2218 08/12/12 0734  GLUCAP 138* 151* 129* 133* 131*   D-Dimer No results found for this basename: DDIMER,  in the last 72 hours Hgb A1c No results found for this basename: HGBA1C,  in the last 72 hours Lipid Profile No results found for this basename: CHOL, HDL, LDLCALC, TRIG, CHOLHDL, LDLDIRECT,  in the last 72 hours Thyroid function studies No results found for this basename: TSH, T4TOTAL, FREET3, T3FREE, THYROIDAB,  in the last 72 hours Anemia work up No results found for this basename: VITAMINB12, FOLATE, FERRITIN, TIBC, IRON, RETICCTPCT,  in the last 72 hours Microbiology Recent Results (from the past 240 hour(s))  CLOSTRIDIUM DIFFICILE BY PCR     Status: None   Collection Time    08/04/12  9:03  AM      Result Value Range Status   C difficile by pcr NEGATIVE  NEGATIVE Final  URINE CULTURE     Status: None   Collection Time    08/05/12  3:45 PM      Result Value Range Status   Specimen Description URINE, CATHETERIZED   Final   Special Requests Immunocompromised   Final   Culture  Setup Time 08/06/2012 01:21   Final   Colony Count >=100,000 COLONIES/ML   Final   Culture     Final   Value: ESCHERICHIA COLI     KLEBSIELLA PNEUMONIAE   Report Status 08/08/2012 FINAL   Final   Organism ID, Bacteria ESCHERICHIA COLI   Final   Organism ID, Bacteria KLEBSIELLA PNEUMONIAE   Final  CULTURE, BLOOD (ROUTINE X 2)     Status: None   Collection Time    08/05/12  4:00 PM      Result Value Range Status   Specimen Description BLOOD LEFT HAND   Final   Special Requests BOTTLES DRAWN AEROBIC AND ANAEROBIC 10CC   Final   Culture  Setup Time 08/05/2012 21:54   Final   Culture NO GROWTH 5 DAYS   Final   Report Status 08/11/2012 FINAL   Final  CULTURE, BLOOD (ROUTINE X 2)     Status: None   Collection Time    08/05/12  4:03 PM      Result Value Range Status   Specimen Description BLOOD LEFT ARM   Final   Special Requests BOTTLES DRAWN AEROBIC AND ANAEROBIC 10CC   Final   Culture  Setup Time 08/05/2012 21:54   Final   Culture NO GROWTH 5 DAYS   Final   Report Status 08/11/2012 FINAL   Final  MRSA PCR SCREENING     Status: None   Collection Time    08/07/12  3:14 PM      Result Value Range Status   MRSA by PCR NEGATIVE  NEGATIVE Final   Comment:            The GeneXpert MRSA Assay (FDA     approved for NASAL specimens     only), is one component of a     comprehensive MRSA colonization     surveillance program. It is not     intended to diagnose MRSA     infection nor to guide or     monitor treatment for     MRSA infections.      Studies:  No results found.  Assessment/ Plan: 57 y.o. [name]  woman currently day 12, cycle 2 chemotherapy, admitted with severe diarrhea,  vomiting, dehydration, multiple electrolyte abnormalities, confusion and severe neutropenia with fever   (1) status post right mastectomy under the care of Dr. 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 Lymphovascular and appearing neural invasion was identified. 2 of 18 lymph nodes were involved. Margins were clear. Pathologic staging pT3, pN1a, stage IIIA. (Note the original clinical staging was IIB.)   (2) being treated in the adjuvant setting with docetaxel/cyclophosphamide given every 3 weeks, with Neulasta on day 2 for granulocyte support. First treatment was given on 07/06/2012. The original goal was to complete a total of 6 cycles, but due to in tolerance, this has been decreased to 4 cycles, and we also decreased her doses by 15% beginning with cycle 2, given 07/29/2012 .  (3) patient will need postmastectomy radiation, which will be followed by antiestrogen therapy.   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   (5) diabetes-- currently on SSI--CBGs acceptable  (6) atrial fib.--controlled on cardizem and beta blocker--appreciate cardiology's help!  (7) fever-neutropenia: s/p3 days primaxin/ vanco, currently day 3 cipro (day 6 ABX); neutrophils now recovered; urine grew klebsiella and e coli, both cipro sensitive  (8) diarrhea: c diff negative; still putting out c. 400 cc/day via flexiseal; continue to replace fluids and electrolytes; d/c octreotide; try questran  (9) Full code. Following labs daily. Will d/c flexiseal once voulme of diarrhea decreases to <500 cc/day (was 650 cc yesterday). Will transfer to regular floor today; will getRehabto assess;   (10) nutrition remains an issue--start calorie counts  (11) DVT prophylaxis: in place  (12) once stool issue is resolved and  patient is ambulatory and can hydrate herself will d/c to home--hopefully early next week.     MAGRINAT,GUSTAV C 08/12/2012

## 2012-08-12 NOTE — Progress Notes (Signed)
Cardiologist:  Delane Ginger, MD   [name] is a 57 yo with a recent Dx of breast cancer, s/p Right mastectomy, getting chemotherapy.   She was admitted 3/25 with nausea, vomitting, and dehydration.  She was diagnosed with atrial fib recently - July 14, 2012.  Subjective:  Patient has been changed to PO diltiazem.  BP is a bit lower.     Objective:  Vital Signs in the last 24 hours: Temp:  [97.3 F (36.3 C)-98.7 F (37.1 C)] 97.3 F (36.3 C) (04/02 2313) Pulse Rate:  [71-153] 73 (04/02 2313) Resp:  [16-24] 16 (04/02 2313) BP: (85-158)/(39-80) 107/76 mmHg (04/02 2313) SpO2:  [95 %-98 %] 97 % (04/02 2313) Weight:  [185 lb (83.915 kg)] 185 lb (83.915 kg) (04/02 1240)  Intake/Output from previous day: 04/02 0701 - 04/03 0700 In: 3242.5 [P.O.:120; I.V.:2722.5; IV Piggyback:400] Out: 200 [Urine:200] Intake/Output from this shift:    . antiseptic oral rinse  15 mL Mouth Rinse BID  . aspirin EC  81 mg Oral q morning - 10a  . atorvastatin  10 mg Oral QHS  . buPROPion  150 mg Oral q morning - 10a  . cholestyramine  4 g Oral BID  . ciprofloxacin  400 mg Intravenous Q12H  . diltiazem  120 mg Oral Daily  . enoxaparin  40 mg Subcutaneous Daily  . escitalopram  10 mg Oral QHS  . insulin aspart  0-15 Units Subcutaneous TID WC  . levothyroxine  100 mcg Oral QAC breakfast  . metoprolol succinate  100 mg Oral Q breakfast  . multivitamin with minerals  1 tablet Oral Daily  . pantoprazole (PROTONIX) IV  40 mg Intravenous QHS  . prochlorperazine  5 mg Intravenous QID  . saccharomyces boulardii  250 mg Oral BID   . 0.9 % sodium chloride with kcl 125 mL/hr at 08/11/12 2020    Physical Exam: The patient appears to be chronically ill  Head and neck exam : unremarkable, patient is bald Chest is clear to percussion and auscultation.  No rales or rhonchi.  IV port in left upper chest Heart :  Irreg. Irreg.  Rate is better  The abdomen is soft and nontender.  Bowel sounds are normoactive.   There is no hepatosplenomegaly or mass.  There are no abdominal bruits.  Extremities reveal no phlebitis. 1 + edema .  Pedal pulses are good.  There is no cyanosis or clubbing.  Neurologic exam is normal strength and no lateralizing weakness.  No sensory deficits.  Integument reveals no rash  Lab Results:  Recent Labs  08/11/12 0420 08/12/12 0630  WBC 4.6 4.9  HGB 13.1 12.0  PLT 72* 61*    Recent Labs  08/11/12 0915 08/12/12 0605  NA 133* 133*  K 3.8 3.7  CL 106 107  CO2 15* 15*  GLUCOSE 161* 127*  BUN 17 16  CREATININE 0.87 0.78   No results found for this basename: TROPONINI, CK, MB,  in the last 72 hours Hepatic Function Panel  Recent Labs  08/12/12 0605  PROT 3.9*  ALBUMIN 1.3*  AST 29  ALT 19  ALKPHOS 130*  BILITOT 0.4   No results found for this basename: CHOL,  in the last 72 hours No results found for this basename: PROTIME,  in the last 72 hours  Imaging: No results found.  Cardiac Studies: Telemetry 3/31:   rapid atrial fib.  Assessment/Plan:  1. Atrial fib with rapid ventricular response.   HR is well controlled but  her BP has dropped.  HR is well controlled on current dose of metoprolol and dilt.  Currently, she has a platelet count of 61 Her CHADS score is ~ 1 for HTN.  .  I would continue aspirin for now and would consider xarelto as she improves.     No further cardiac recs  She is on Toprol XL  - may increase dose if she continues to have NSVT .  These episodes are asymptomatic at this point.    2. Anemia improved after 2 units of packed cells this weekend. 3. Breast cancer, chemotherapy. 4. Sore mouth from chemo, she was refusing po meds.      LOS: 9 days    [name]. 08/12/2012, 7:35 AM

## 2012-08-12 NOTE — Evaluation (Addendum)
Occupational Therapy Evaluation Patient Details Name: [name] MRN: [id number] DOB: [birth year] Today's Date: 08/12/2012 Time: 4098-1191 OT Time Calculation (min): 29 min  OT Assessment / Plan / Recommendation Clinical Impression  This 57 year old female was admitted for A-Fib.  She was dxd with Stage IIIA breast CA in Jan of this year and is s/p XRT and chemo.  She was independent prior to admission with ADLs and is now deconditioned requiring A x 2 for ADLs for safety.  She will benefit from continued OT.    OT Assessment  Patient needs continued OT Services    Follow Up Recommendations  CIR    Barriers to Discharge      Equipment Recommendations  3 in 1 bedside comode (wait for tub dme)    Recommendations for Other Services    Frequency  Min 2X/week    Precautions / Restrictions Precautions Precautions: Fall Restrictions Weight Bearing Restrictions: No   Pertinent Vitals/Pain No c/o pain    ADL  Grooming: Set up Where Assessed - Grooming: Supported sitting Upper Body Bathing: Set up Where Assessed - Upper Body Bathing: Supported sitting Lower Body Bathing: +2 Total assistance Lower Body Bathing: Patient Percentage: 30% Where Assessed - Lower Body Bathing: Supported sit to stand Upper Body Dressing: Minimal assistance Where Assessed - Upper Body Dressing: Supported sitting Lower Body Dressing: +2 Total assistance Lower Body Dressing: Patient Percentage: 10% Where Assessed - Lower Body Dressing: Supported sit to Pharmacist, hospital: +2 Total assistance Toilet Transfer: Patient Percentage: 60% Statistician Method: Surveyor, minerals:  (bed to chair) Toileting - Architect and Hygiene: +2 Total assistance Toileting - Clothing Manipulation and Hygiene: Patient Percentage: 20% Where Assessed - Toileting Clothing Manipulation and Hygiene: Sit to stand from 3-in-1 or toilet Equipment Used: Gait belt;Rolling  walker Transfers/Ambulation Related to ADLs: Performed SPT to recliner.  Pt very deconditioned but very motivated.  Was happy to get up out of bed ADL Comments: Pt's sats good (95%) but dyspnea 2/4 with all activities.  Educated to take rest breaks and pace herself.  Also educated to work on AROM for Bil UEs.      OT Diagnosis: Generalized weakness  OT Problem List: Decreased strength;Decreased activity tolerance;Impaired balance (sitting and/or standing);Decreased knowledge of use of DME or AE;Cardiopulmonary status limiting activity ((dyspnea)) OT Treatment Interventions: Self-care/ADL training;Therapeutic exercise;DME and/or AE instruction;Therapeutic activities;Patient/family education;Balance training;Energy conservation   OT Goals Acute Rehab OT Goals OT Goal Formulation: With patient Time For Goal Achievement: 08/26/12 Potential to Achieve Goals: Good ADL Goals Pt Will Perform Lower Body Bathing: with min assist;Sit to stand from chair;with adaptive equipment ADL Goal: Lower Body Bathing - Progress: Goal set today Pt Will Perform Lower Body Dressing: with mod assist;Sit to stand from chair;with adaptive equipment ADL Goal: Lower Body Dressing - Progress: Goal set today Pt Will Transfer to Toilet: with min assist;Stand pivot transfer;3-in-1 ADL Goal: Toilet Transfer - Progress: Goal set today Pt Will Perform Toileting - Hygiene: with min assist;Sit to stand from 3-in-1/toilet ADL Goal: Toileting - Hygiene - Progress: Goal set today Miscellaneous OT Goals Miscellaneous OT Goal #1: Pt will complete UB adls from unsupported sitting with supervision and initiate at least one rest break for energy conservation OT Goal: Miscellaneous Goal #1 - Progress: Goal set today Miscellaneous OT Goal #2: Pt will be independent with light resistance theraband program to improve strength/endurance for adl OT Goal: Miscellaneous Goal #2 - Progress: Goal set today  Visit Information  Last  OT Received  On: 08/12/12 Assistance Needed: +2 PT/OT Co-Evaluation/Treatment: Yes    Subjective Data  Subjective: Will I ever get strong again? Patient Stated Goal: rehab to get stronger, then home   Prior Functioning     Home Living Lives With: Spouse;Family (daughter,son, 2 and 5 yo grandkids) Type of Home: House Home Access: Stairs to enter Secretary/administrator of Steps: 3 Entrance Stairs-Rails: None Home Layout: One level Bathroom Shower/Tub: Network engineer: None Prior Function Level of Independence: Independent Able to Take Stairs?: Yes Driving: Yes Communication Communication: No difficulties         Vision/Perception     Cognition  Cognition Overall Cognitive Status: Appears within functional limits for tasks assessed/performed Arousal/Alertness: Awake/alert Orientation Level: Time (stated april 4.) Behavior During Session: WFL for tasks performed    Extremity/Trunk Assessment Right Upper Extremity Assessment RUE ROM/Strength/Tone: [facility] for tasks assessed (abotu 140; strength 4/5) Left Upper Extremity Assessment LUE ROM/Strength/Tone: Within functional levels (strength 4/5)     Mobility Bed Mobility Bed Mobility: Rolling Left;Left Sidelying to Sit;Sitting - Scoot to Edge of Bed Rolling Right: 1: +2 Total assist Rolling Right: Patient Percentage: 50% Rolling Left: With rail;1: +2 Total assist Rolling Left: Patient Percentage: 50% Right Sidelying to Sit: 1: +2 Total assist Right Sidelying to Sit: Patient Percentage: 50% Left Sidelying to Sit: 1: +2 Total assist;With rails Left Sidelying to Sit: Patient Percentage: 50% Sitting - Scoot to Edge of Bed: 3: Mod assist Details for Bed Mobility Assistance: verbal cues to use rail, pt initiated moving legs toward edge and placing them over, assisting trunk into upright position, tactile cues to weight shift  to scoot. Transfers Transfers: Sit to Stand;Stand to  Sit Sit to Stand: 1: +2 Total assist;From bed Sit to Stand: Patient Percentage: 60% Stand to Sit: To chair/3-in-1;With upper extremity assist;1: +2 Total assist Stand to Sit: Patient Percentage: 60% Details for Transfer Assistance: cues for hand placement    Exercise Performed 5 reps of AROM shoulder flexion; educated to continue to perform for general strengthening    Balance Balance Balance Assessed: Yes Static Sitting Balance Static Sitting - Balance Support: Bilateral upper extremity supported Static Sitting - Level of Assistance: 5: Stand by assistance Static Sitting - Comment/# of Minutes: pt/ sits at midline.   End of Session OT - End of Session Activity Tolerance: Patient limited by fatigue Patient left: in chair;with call bell/phone within reach Nurse Communication: Mobility status;Need for lift equipment (replace white name band--tight)  GO     Jaliah Foody 08/12/2012, 11:43 AM [name], OTR/L [phone] 08/12/2012

## 2012-08-12 NOTE — Progress Notes (Signed)
NUTRITION FOLLOW UP  Intervention:   - Resource Breeze BID - Will monitor and analyze calorie count - do not expect pt to meet PO needs orally and continue to recommend enteral nutrition r/t pt's prolonged malnutrition with mostly 0% PO intake since diet advanced - Will continue to monitor  Nutrition Dx:   Altered GI function related to nausea, vomiting, diarrhea as evidenced by MD notes - ongoing  Goal:   1. Resolution of nausea, vomiting, diarrhea - not met 2. Advance diet as tolerated to low fiber, bland diet - met with advancement to regular diet  New goal: Pt to consume >50% of meals/supplements  Monitor:   Weights, labs, intake, calorie count  Assessment:   Diet advanced to regular on 3/30. Intake recorded has been 0-20%. Met with pt, who was half asleep, who said that her husband was bringing in her food. Pt states she is willing to try Resource Breeze for additional nutrition. Pt's weight up 4 pounds since admission however this is most likely r/t fluid gain as pt with ongoing poor PO intake. 48 hour calorie count ordered that started this morning - no intake recorded yet today other than 1/2 cup of water. Pt with flexiseal in place with continued diarrhea, total output charted yesterday.   Height: Ht Readings from Last 1 Encounters:  08/11/12 5\' 4"  (1.626 m)    Weight Status:   Wt Readings from Last 1 Encounters:  08/11/12 185 lb (83.915 kg)    Re-estimated needs:  Kcal: 1400-1650 Protein: 55-65g Fluid: 1.4-1.6L/day  Skin: +1 RLE and LLE edema  Diet Order: General   Intake/Output Summary (Last 24 hours) at 08/12/12 1425 Last data filed at 08/12/12 1300  Gross per 24 hour  Intake   2395 ml  Output   1440 ml  Net    955 ml    Last BM: 4/2, diarrhea, has flexiseal with total output yesterday   Labs:   Recent Labs Lab 08/09/12 0350 08/10/12 1700 08/11/12 0915 08/12/12 0605  NA 139 131* 133* 133*  K 4.0 3.9 3.8 3.7  CL 112 107 106 107   CO2 15* 16* 15* 15*  BUN 15 17 17 16   CREATININE 0.73 0.89 0.87 0.78  CALCIUM 7.1* 7.2* 7.0* 6.7*  MG 1.5  --  1.5 1.5  PHOS 2.3  --  3.2 3.8  GLUCOSE 168* 128* 161* 127*    CBG (last 3)   Recent Labs  08/11/12 2218 08/12/12 0734 08/12/12 1137  GLUCAP 133* 131* 126*    Scheduled Meds: . antiseptic oral rinse  15 mL Mouth Rinse BID  . aspirin EC  81 mg Oral q morning - 10a  . atorvastatin  10 mg Oral QHS  . buPROPion  150 mg Oral q morning - 10a  . cholestyramine  4 g Oral BID  . diltiazem  120 mg Oral Daily  . enoxaparin  40 mg Subcutaneous Daily  . escitalopram  10 mg Oral QHS  . insulin aspart  0-15 Units Subcutaneous TID WC  . levothyroxine  100 mcg Oral QAC breakfast  . metoprolol succinate  100 mg Oral Q breakfast  . multivitamin with minerals  1 tablet Oral Daily  . pantoprazole (PROTONIX) IV  40 mg Intravenous QHS  . prochlorperazine  5 mg Intravenous QID  . saccharomyces boulardii  250 mg Oral BID    Continuous Infusions: . 0.9 % sodium chloride with kcl 50 mL/hr at 08/12/12 0855     Levon Hedger  MS, RD, LDN [phone] Pager [phone] After Hours Pager

## 2012-08-12 NOTE — Progress Notes (Signed)
Spoke with [name] at bedside. Noted therapy recommends CIR. Patient is active with the [facility] program for her diabetes management. Will continue to follow and assist as needed and appropriate. Will update [facility]MR case manager as well about the recommendation for CIR placement.   Raiford Noble, MSN- Ed, RN, BSN [facility] [phone]

## 2012-08-12 NOTE — Evaluation (Signed)
Physical Therapy Evaluation Patient Details Name: [name] MRN: [id number] DOB: 1955-06-12 Today's Date: 08/12/2012 Time: 4259-5638 PT Time Calculation (min): 32 min  PT Assessment / Plan / Recommendation Clinical Impression  Pt. is 57 yo female admitted 08/03/12 with diarrhea, dehydration, onset a fib. , recent dx. breast cancer, s/p radical R mastectomy on  1/14.. Pt. has h/o cirrhosis, DM . Pt. presents with weakness of extremeties and decreased endurance. Pt. will benefit from PT while in acute care. Pt. will benefit from post acute rehab.    PT Assessment  Patient needs continued PT services    Follow Up Recommendations  CIR    Does the patient have the potential to tolerate intense rehabilitation      Barriers to Discharge        Equipment Recommendations  Rolling walker with 5" wheels;Wheelchair (measurements PT)    Recommendations for Other Services     Frequency Min 3X/week    Precautions / Restrictions Precautions Precautions: Fall Restrictions Weight Bearing Restrictions: No   Pertinent Vitals/Pain sats on RA after  Transfer 90 RA, HR 54 dyspnea 2-3 /4      Mobility  Bed Mobility Bed Mobility: Rolling Left;Left Sidelying to Sit;Sitting - Scoot to Edge of Bed Rolling Right: 1: +2 Total assist Rolling Right: Patient Percentage: 50% Rolling Left: With rail;1: +2 Total assist Rolling Left: Patient Percentage: 50% Right Sidelying to Sit: 1: +2 Total assist Right Sidelying to Sit: Patient Percentage: 50% Left Sidelying to Sit: 1: +2 Total assist;With rails Left Sidelying to Sit: Patient Percentage: 50% Sitting - Scoot to Edge of Bed: 3: Mod assist Details for Bed Mobility Assistance: verbal cues to use rail, pt initiated moving legs toward edge and placing them over, assisting trunk into upright position, tactile cues to weight shift  to scoot. Transfers Transfers: Sit to Stand;Stand to Sit;Stand Pivot Transfers Sit to Stand: 1: +2 Total assist;From  bed Sit to Stand: Patient Percentage: 60% Stand to Sit: To chair/3-in-1;With upper extremity assist;1: +2 Total assist Stand to Sit: Patient Percentage: 60% Stand Pivot Transfers: 1: +2 Total assist Stand Pivot Transfers: Patient Percentage: 60% Details for Transfer Assistance: pt stood as a trial x 1 to test strength, able to lift each foot from floor. After rest, pt staaod agaon and was able to take several small steps to move to recliner. appeared to have decreased dorsiflexion ahen taking a step.  Ambulation/Gait Ambulation/Gait Assistance: Not tested (comment)    Exercises General Exercises - Lower Extremity Ankle Circles/Pumps: AROM;Both Quad Sets: AROM;Both   PT Diagnosis: Generalized weakness  PT Problem List: Decreased strength;Decreased range of motion;Decreased activity tolerance;Decreased mobility;Decreased knowledge of use of DME;Decreased safety awareness;Decreased knowledge of precautions;Pain PT Treatment Interventions: DME instruction;Gait training;Functional mobility training;Therapeutic activities;Therapeutic exercise;Patient/family education   PT Goals Acute Rehab PT Goals PT Goal Formulation: With patient Time For Goal Achievement: 08/12/12 Potential to Achieve Goals: Good Pt will go Supine/Side to Sit: with supervision PT Goal: Supine/Side to Sit - Progress: Goal set today Pt will go Sit to Supine/Side: with supervision PT Goal: Sit to Supine/Side - Progress: Goal set today Pt will go Sit to Stand: with min assist PT Goal: Sit to Stand - Progress: Goal set today Pt will go Stand to Sit: with supervision PT Goal: Stand to Sit - Progress: Goal set today Pt will Transfer Bed to Chair/Chair to Bed: with min assist PT Transfer Goal: Bed to Chair/Chair to Bed - Progress: Goal set today Pt will Ambulate: 16 - 50 feet;with  min assist PT Goal: Ambulate - Progress: Goal set today Pt will Perform Home Exercise Program: with supervision, verbal cues required/provided PT  Goal: Perform Home Exercise Program - Progress: Goal set today  Visit Information  Last PT Received On: 08/12/12 Assistance Needed: +2 PT/OT Co-Evaluation/Treatment: Yes    Subjective Data  Subjective: I want to get up.  Patient Stated Goal: To get back doing the things I used to. I want to go to rehab   Prior Functioning  Home Living Lives With: Spouse;Family (daughter,son, 2 and 5 yo grandkids) Type of Home: House Home Access: Stairs to enter Secretary/administrator of Steps: 3 Entrance Stairs-Rails: None Home Layout: One level Bathroom Shower/Tub: Network engineer: None Prior Function Level of Independence: Independent Able to Take Stairs?: Yes Driving: Yes Communication Communication: No difficulties    Cognition  Cognition Overall Cognitive Status: Appears within functional limits for tasks assessed/performed Arousal/Alertness: Awake/alert Orientation Level: Time (stated april 4.) Behavior During Session: WFL for tasks performed    Extremity/Trunk Assessment Right Upper Extremity Assessment RUE ROM/Strength/Tone: [facility] for tasks assessed (abotu 140; strength 4/5) Left Upper Extremity Assessment LUE ROM/Strength/Tone: Within functional levels (strength 4/5) Right Lower Extremity Assessment RLE ROM/Strength/Tone: Deficits RLE ROM/Strength/Tone Deficits: strngth frossly 3+ hip flexion, knee ext. able to stand and bear weight. dorsiflexion appears weak with ? mild drop foot during transfer RLE Sensation: WFL - Light Touch Left Lower Extremity Assessment LLE ROM/Strength/Tone: Deficits LLE ROM/Strength/Tone Deficits: same as R,  LLE Sensation: WFL - Light Touch   Balance Balance Balance Assessed: Yes Static Sitting Balance Static Sitting - Balance Support: Bilateral upper extremity supported Static Sitting - Level of Assistance: 5: Stand by assistance Static Sitting - Comment/# of Minutes: pt/ sits at midline.  End of  Session PT - End of Session Equipment Utilized During Treatment: Gait belt Activity Tolerance: Patient tolerated treatment well;Patient limited by fatigue Patient left: in chair;with call bell/phone within reach Nurse Communication: Mobility status;Need for lift equipment (stedy)  GP     Rada Hay 08/12/2012, 11:49 AM Blanchard Kelch PT [phone]

## 2012-08-13 DIAGNOSIS — M6281 Muscle weakness (generalized): Secondary | ICD-10-CM

## 2012-08-13 LAB — COMPREHENSIVE METABOLIC PANEL
Albumin: 1.5 g/dL — ABNORMAL LOW (ref 3.5–5.2)
BUN: 16 mg/dL (ref 6–23)
Creatinine, Ser: 0.83 mg/dL (ref 0.50–1.10)
GFR calc Af Amer: 88 mL/min — ABNORMAL LOW (ref >90)
Glucose, Bld: 112 mg/dL — ABNORMAL HIGH (ref 70–99)
Total Bilirubin: 0.5 mg/dL (ref 0.3–1.2)
Total Protein: 4.3 g/dL — ABNORMAL LOW (ref 6.0–8.3)

## 2012-08-13 LAB — PHOSPHORUS: Phosphorus: 3.1 mg/dL (ref 2.3–4.6)

## 2012-08-13 LAB — CBC WITH DIFFERENTIAL/PLATELET
Basophils Absolute: 0 10*3/uL (ref 0.0–0.1)
Eosinophils Absolute: 0 10*3/uL (ref 0.0–0.7)
HCT: 37.8 % (ref 36.0–46.0)
Lymphs Abs: 0.8 10*3/uL (ref 0.7–4.0)
MCH: 26.9 pg (ref 26.0–34.0)
MCHC: 33.9 g/dL (ref 30.0–36.0)
MCV: 79.4 fL (ref 78.0–100.0)
Monocytes Absolute: 0.3 10*3/uL (ref 0.1–1.0)
Neutro Abs: 5.1 10*3/uL (ref 1.7–7.7)
RDW: 20.9 % — ABNORMAL HIGH (ref 11.5–15.5)

## 2012-08-13 LAB — GLUCOSE, CAPILLARY
Glucose-Capillary: 111 mg/dL — ABNORMAL HIGH (ref 70–99)
Glucose-Capillary: 160 mg/dL — ABNORMAL HIGH (ref 70–99)

## 2012-08-13 LAB — MAGNESIUM: Magnesium: 1.6 mg/dL (ref 1.5–2.5)

## 2012-08-13 MED ORDER — PROCHLORPERAZINE EDISYLATE 5 MG/ML IJ SOLN: 10.0000 mg | Freq: Four times a day (QID) | INTRAMUSCULAR | Status: DC | PRN

## 2012-08-13 MED ORDER — PANTOPRAZOLE SODIUM 40 MG PO TBEC
40.0000 mg | DELAYED_RELEASE_TABLET | Freq: Every day | ORAL | Status: DC
  Administered 2012-08-13 – 2012-08-17 (×5): 40 mg via ORAL
  Filled 2012-08-13 (×5): qty 1

## 2012-08-13 NOTE — Progress Notes (Signed)
Cardiologist:  Delane Ginger, MD   [name] is a 57 yo with a recent Dx of breast cancer, s/p Right mastectomy, getting chemotherapy.   She was admitted 3/25 with nausea, vomitting, and dehydration.  She was diagnosed with atrial fib recently - July 14, 2012.  Subjective:  Patient has been changed to PO diltiazem.  BP is a bit lower.     Objective:  Vital Signs in the last 24 hours: Temp:  [96.8 F (36 C)-97.6 F (36.4 C)] 97.4 F (36.3 C) (04/04 0546) Pulse Rate:  [75-97] 97 (04/04 0546) Resp:  [16-20] 16 (04/04 0546) BP: (84-100)/(54-62) 100/58 mmHg (04/04 0615) SpO2:  [97 %-99 %] 99 % (04/04 0546)  Intake/Output from previous day: 04/03 0701 - 04/04 0700 In: 1618.8 [P.O.:200; I.V.:1418.8] Out: 2050 [Urine:1250; Stool:800] Intake/Output from this shift: Total I/O In: 120 [P.O.:120] Out: -   . antiseptic oral rinse  15 mL Mouth Rinse BID  . aspirin EC  81 mg Oral q morning - 10a  . atorvastatin  10 mg Oral QHS  . buPROPion  150 mg Oral q morning - 10a  . cholestyramine  4 g Oral BID  . diltiazem  120 mg Oral Daily  . enoxaparin  40 mg Subcutaneous Daily  . escitalopram  10 mg Oral QHS  . feeding supplement  1 Container Oral BID BM  . insulin aspart  0-15 Units Subcutaneous TID WC  . levothyroxine  100 mcg Oral QAC breakfast  . metoprolol succinate  100 mg Oral Q breakfast  . multivitamin with minerals  1 tablet Oral Daily  . pantoprazole  40 mg Oral Daily  . saccharomyces boulardii  250 mg Oral BID   . 0.9 % sodium chloride with kcl 50 mL/hr at 08/12/12 2025    Physical Exam: The patient appears to be chronically ill  Head and neck exam : unremarkable, patient is bald Chest is clear to percussion and auscultation.  No rales or rhonchi.  IV port in left upper chest Heart :  Irreg. Irreg.  Rate is better  The abdomen is soft and nontender.  Bowel sounds are normoactive.  There is no hepatosplenomegaly or mass.  There are no abdominal bruits.  Extremities  reveal no phlebitis. 1 + edema .  Pedal pulses are good.  There is no cyanosis or clubbing.  Neurologic exam is normal strength and no lateralizing weakness.  No sensory deficits.  Integument reveals no rash  Lab Results:  Recent Labs  08/12/12 0630 08/13/12 0555  WBC 4.9 6.2  HGB 12.0 12.8  PLT 61* 68*    Recent Labs  08/12/12 0605 08/13/12 0555  NA 133* 135  K 3.7 3.4*  CL 107 109  CO2 15* 16*  GLUCOSE 127* 112*  BUN 16 16  CREATININE 0.78 0.83   No results found for this basename: TROPONINI, CK, MB,  in the last 72 hours Hepatic Function Panel  Recent Labs  08/13/12 0555  PROT 4.3*  ALBUMIN 1.5*  AST 27  ALT 19  ALKPHOS 157*  BILITOT 0.5   No results found for this basename: CHOL,  in the last 72 hours No results found for this basename: PROTIME,  in the last 72 hours  Imaging: No results found.  Cardiac Studies: Telemetry 3/31:   rapid atrial fib.  Assessment/Plan:  1. Atrial fib with rapid ventricular response.   HR is well controlled but her BP has dropped.  HR is well controlled on current dose of metoprolol and  dilt.  Currently, she has a platelet count of 68 Her CHADS score is ~ 1 for HTN.  .  I would continue aspirin for now and would consider xarelto as she improves.    No further cardiac recs  2. Anemia improved after 2 units of packed cells   3. Breast cancer, chemotherapy.      LOS: 10 days    [name]. 08/13/2012, 8:01 AM

## 2012-08-13 NOTE — Progress Notes (Addendum)
Occupational Therapy Treatment Patient Details Name: [name] MRN: [id number] DOB: August 14, 1955 Today's Date: 08/13/2012 Time: 9147-8295 OT Time Calculation (min): 30 min  OT Assessment / Plan / Recommendation Comments on Treatment Session Pt fatiques easily.  Very motivated.    Follow Up Recommendations  SNF    Barriers to Discharge       Equipment Recommendations  3 in 1 bedside comode    Recommendations for Other Services    Frequency Min 2X/week   Plan      Precautions / Restrictions Precautions Precautions: Fall   Pertinent Vitals/Pain No c/o pain    ADL  Grooming: Min guard;Teeth care Where Assessed - Grooming: Supported standing;Unsupported sitting Toilet Transfer:  (pt has cathether and rectal tube. Used stedy today) Equipment Used:  (stedy) Transfers/Ambulation Related to ADLs: worked on sit to stand using stedy j(A x 2, pt 60%) also worked on [employer] in chair (mod A)  for repositioning and to apply lotion ADL Comments: Pt fatiqued after brushing teeth:  dyspnea 2/4, sats 95% HR 109    OT Diagnosis:    OT Problem List:   OT Treatment Interventions:     OT Goals Acute Rehab OT Goals Time For Goal Achievement: 08/26/12 ADL Goals Pt Will Perform Grooming: with supervision;Standing at sink;Sitting at sink;Supported ADL Goal: Grooming - Progress: Goal set today  Visit Information  Last OT Received On: 08/13/12 Assistance Needed: +2 PT/OT Co-Evaluation/Treatment: Yes    Subjective Data      Prior Functioning       Cognition  Cognition Behavior During Session: [facility] for tasks performed Cognition - Other Comments: pt was asleep and was confused about time when she woke up    Mobility  Bed Mobility Bed Mobility: Supine to Sit Rolling Right: 1: +2 Total assist Rolling Right: Patient Percentage: 60% Details for Bed Mobility Assistance: verbal cues for technique; initial tactile cue to initiate Transfers Sit to Stand: 1: +2 Total assist;From  bed Sit to Stand: Patient Percentage: 60%    Exercises      Balance     End of Session OT - End of Session Activity Tolerance: Patient limited by fatigue Patient left: in chair;with call bell/phone within reach Nurse Communication: Mobility status;Need for lift equipment  GO     [name] 08/13/2012, 2:42 PM [name], OTR/L [phone] 08/13/2012

## 2012-08-13 NOTE — Progress Notes (Signed)
BRIANNON BOGGIO   DOB:[birth year]   YN#:[id number]   QMV#:[id number]  Subjective: Kamariah remains weak; she met w Phys Ther once yesterday and is very motivated. Still not eating much. OOBTC about 1 hour per her account. Tolerating foley and flexiseal w/o concerns. Husband not in room  Objective: middle aged white woman examined in bed Filed Vitals:   08/13/12 0615  BP: 100/58  Pulse:   Temp:   Resp:     Body mass index is 31.74 kg/(m^2).  Intake/Output Summary (Last 24 hours) at 08/13/12 0736 Last data filed at 08/13/12 0641  Gross per 24 hour  Intake 1618.75 ml  Output   2050 ml  Net -431.25 ml   STOOL: 700 cc/24 hrs per RN   Oropharynx clear  No peripheral adenopathy  Lungs auscultated anteriorly, no rales or wheezes  Heart irregular rate, no murmur appreciated  Abdomen soft, NT, positive BS  MSK bilateral 1+ ankle edema  Neuro nonfocal, oriented x3 (!)  Breast exam: deferred  Stool 400 cc/ 24 h; urine not accurately recorded  CBG (last 3)   Recent Labs  08/12/12 1738 08/12/12 2136 08/13/12 0721  GLUCAP 159* 143* 119*     Labs:  Lab Results  Component Value Date   WBC 6.2 08/13/2012   HGB 12.8 08/13/2012   HCT 37.8 08/13/2012   MCV 79.4 08/13/2012   PLT 68* 08/13/2012   NEUTROABS 5.1 08/13/2012    @LASTCHEMISTRY @  Urine Studies No results found for this basename: UACOL, UAPR, USPG, UPH, UTP, UGL, UKET, UBIL, UHGB, UNIT, UROB, ULEU, UEPI, UWBC, URBC, UBAC, CAST, CRYS, UCOM, BILUA,  in the last 72 hours  Basic Metabolic Panel:  Recent Labs Lab 08/08/12 0340 08/09/12 0350 08/10/12 1700 08/11/12 0915 08/12/12 0605 08/13/12 0555  NA 137 139 131* 133* 133* 135  K 4.5 4.0 3.9 3.8 3.7 3.4*  CL 111 112 107 106 107 109  CO2 15* 15* 16* 15* 15* 16*  GLUCOSE 144* 168* 128* 161* 127* 112*  BUN 15 15 17 17 16 16   CREATININE 0.60 0.73 0.89 0.87 0.78 0.83  CALCIUM 7.3* 7.1* 7.2* 7.0* 6.7* 6.8*  MG 1.6 1.5  --  1.5 1.5 1.6  PHOS 2.2* 2.3  --  3.2 3.8 3.1    GFR Estimated Creatinine Clearance: 77.4 ml/min (by C-G formula based on Cr of 0.83). Liver Function Tests:  Recent Labs Lab 08/08/12 0340 08/09/12 0350 08/11/12 0915 08/12/12 0605 08/13/12 0555  AST 52* 29 31 29 27   ALT 29 24 20 19 19   ALKPHOS 128* 158* 153* 130* 157*  BILITOT 0.8 0.7 0.6 0.4 0.5  PROT 4.1* 4.2* 4.1* 3.9* 4.3*  ALBUMIN 1.5* 1.5* 1.4* 1.3* 1.5*   No results found for this basename: LIPASE, AMYLASE,  in the last 168 hours No results found for this basename: AMMONIA,  in the last 168 hours Coagulation profile No results found for this basename: INR, PROTIME,  in the last 168 hours  CBC:  Recent Labs Lab 08/09/12 0350 08/10/12 0425 08/11/12 0420 08/12/12 0630 08/13/12 0555  WBC 4.5 4.2 4.6 4.9 6.2  NEUTROABS 2.7 2.9 3.5 3.9 5.1  HGB 13.9 13.4 13.1 12.0 12.8  HCT 41.5 39.9 38.5 35.5* 37.8  MCV 78.9 79.0 78.6 79.1 79.4  PLT 81* 68* 72* 61* 68*   Cardiac Enzymes: No results found for this basename: CKTOTAL, CKMB, CKMBINDEX, TROPONINI,  in the last 168 hours BNP: No components found with this basename: POCBNP,  CBG:  Recent Labs Lab  08/12/12 0734 08/12/12 1137 08/12/12 1738 08/12/12 2136 08/13/12 0721  GLUCAP 131* 126* 159* 143* 119*   D-Dimer No results found for this basename: DDIMER,  in the last 72 hours Hgb A1c No results found for this basename: HGBA1C,  in the last 72 hours Lipid Profile No results found for this basename: CHOL, HDL, LDLCALC, TRIG, CHOLHDL, LDLDIRECT,  in the last 72 hours Thyroid function studies No results found for this basename: TSH, T4TOTAL, FREET3, T3FREE, THYROIDAB,  in the last 72 hours Anemia work up No results found for this basename: VITAMINB12, FOLATE, FERRITIN, TIBC, IRON, RETICCTPCT,  in the last 72 hours Microbiology Recent Results (from the past 240 hour(s))  CLOSTRIDIUM DIFFICILE BY PCR     Status: None   Collection Time    08/04/12  9:03 AM      Result Value Range Status   C difficile by pcr  NEGATIVE  NEGATIVE Final  URINE CULTURE     Status: None   Collection Time    08/05/12  3:45 PM      Result Value Range Status   Specimen Description URINE, CATHETERIZED   Final   Special Requests Immunocompromised   Final   Culture  Setup Time 08/06/2012 01:21   Final   Colony Count >=100,000 COLONIES/ML   Final   Culture     Final   Value: ESCHERICHIA COLI     KLEBSIELLA PNEUMONIAE   Report Status 08/08/2012 FINAL   Final   Organism ID, Bacteria ESCHERICHIA COLI   Final   Organism ID, Bacteria KLEBSIELLA PNEUMONIAE   Final  CULTURE, BLOOD (ROUTINE X 2)     Status: None   Collection Time    08/05/12  4:00 PM      Result Value Range Status   Specimen Description BLOOD LEFT HAND   Final   Special Requests BOTTLES DRAWN AEROBIC AND ANAEROBIC 10CC   Final   Culture  Setup Time 08/05/2012 21:54   Final   Culture NO GROWTH 5 DAYS   Final   Report Status 08/11/2012 FINAL   Final  CULTURE, BLOOD (ROUTINE X 2)     Status: None   Collection Time    08/05/12  4:03 PM      Result Value Range Status   Specimen Description BLOOD LEFT ARM   Final   Special Requests BOTTLES DRAWN AEROBIC AND ANAEROBIC 10CC   Final   Culture  Setup Time 08/05/2012 21:54   Final   Culture NO GROWTH 5 DAYS   Final   Report Status 08/11/2012 FINAL   Final  MRSA PCR SCREENING     Status: None   Collection Time    08/07/12  3:14 PM      Result Value Range Status   MRSA by PCR NEGATIVE  NEGATIVE Final   Comment:            The GeneXpert MRSA Assay (FDA     approved for NASAL specimens     only), is one component of a     comprehensive MRSA colonization     surveillance program. It is not     intended to diagnose MRSA     infection nor to guide or     monitor treatment for     MRSA infections.      Studies:  No results found.  Assessment/ Plan: 57 y.o. [name] currently day 12, cycle 2 chemotherapy, admitted with severe diarrhea, vomiting, dehydration, multiple electrolyte abnormalities,  confusion and  severe neutropenia with fever   (1) status post right mastectomy under the care of Dr. 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 Lymphovascular and appearing neural invasion was identified. 2 of 18 lymph nodes were involved. Margins were clear. Pathologic staging pT3, pN1a, stage IIIA. (Note the original clinical staging was IIB.)   (2) treated in the adjuvant setting with docetaxel/ cyclophosphamide. First treatment was given on 07/06/2012, very poorly tolerated. We decreased her doses by 15% with cycle 2, given 07/29/2012, but tolerance still very poor. No further chemotherapy planned .  (3) patient will need postmastectomy radiation, which will be followed by antiestrogen therapy.   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   (5) diabetes-- currently on SSI--CBGs acceptable  (6) atrial fib.--controlled on cardizem and beta blocker--low BP has been an issue; cardizem dose was decreased 04/03  (7) fever-neutropenia: s/p3 days primaxin/ vanco, s/p 4 days cipro (7 days ABX); neutrophils now recovered; urine grew klebsiella and e coli, both cipro sensitive; ABX d/c'd 4/3  (8) diarrhea: c diff negative; still putting out > 500 cc/day via flexiseal; continue to replace fluids and electrolytes; d/c'd octreotide; on questran  (9) Full code.  (10) nutrition remains a limiting issue--started calorie counts 04/03  (11) DVT prophylaxis: in place  (12)  Deconditioning: severe; PT/ OT on board; if no significant improvement by next week will have to consider inpt REHAB  (13) weekend goals: make sure electrolytes euboxic, maintain hydration (slight negative balance acceptable so long as BP holds), encourage OOBTC and ambulate w assistance, encourage po  intake       MAGRINAT,GUSTAV C 08/13/2012

## 2012-08-13 NOTE — Plan of Care (Signed)
Problem: Phase I Progression Outcomes Goal: Initial discharge plan identified Outcome: Completed/Met Date Met:  08/13/12 SNF for rehab early next week.

## 2012-08-13 NOTE — Progress Notes (Signed)
Pt has cpap orders, but has refused every night since admission.  Jacqulynn Cadet RRT

## 2012-08-13 NOTE — Progress Notes (Signed)
Calorie Count Note  48 hour calorie count ordered.  Diet: Regular Supplements: Resource Breeze BID  4/3 Breakfast: 0% of meal Lunch: 0% of meal Dinner: 465 calories, 15g protein  4/4 Breakfast: 26 calories, 1g protein Supplements: 1/2 Resource Breeze (125 calories, 4.5g protein)  Total daily average intake: 308 kcal (22% of minimum estimated needs)  10g protein (18% of minimum estimated needs)  Met with pt who reports her appetite is the same as yesterday. Pt states she does not like [employer] but has been trying to drink it. Encouraged pt to add Sprite/Gingerale to help with taste. Pt not interested in trying Ensure, Ensure pudding, or Magic Cup supplements. RD to continue to monitor and analyze calorie count.   Levon Hedger MS, RD, LDN [phone] Pager [phone] After Hours Pager

## 2012-08-13 NOTE — Progress Notes (Signed)
Physical Therapy Treatment Patient Details Name: [name] MRN: [id number] DOB: 23-Dec-1955 Today's Date: 08/13/2012 Time: 4098-1191 PT Time Calculation (min): 28 min  PT Assessment / Plan / Recommendation Comments on Treatment Session  Pt. is tolerating mobilzing , is able to stand from bed/stedy. Pt. may not have endurance for CIR. recommend SNF at this time.    Follow Up Recommendations  SNF     Does the patient have the potential to tolerate intense rehabilitation     Barriers to Discharge        Equipment Recommendations  Rolling walker with 5" wheels;Wheelchair (measurements PT)    Recommendations for Other Services    Frequency Min 3X/week   Plan Discharge plan needs to be updated;Frequency remains appropriate    Precautions / Restrictions Precautions Precautions: Fall   Pertinent Vitals/Pain     Mobility  Bed Mobility Bed Mobility: Supine to Sit Rolling Right: 1: +2 Total assist Rolling Right: Patient Percentage: 60% Rolling Left: With rail;1: +2 Total assist Supine to Sit: 3: Mod assist;With rails;HOB elevated Sitting - Scoot to Edge of Bed: 3: Mod assist Details for Bed Mobility Assistance: verbal cues for technique; initial tactile cue to initiate Transfers Sit to Stand: 1: +2 Total assist;From bed;From elevated surface (from STEDY seat. x 3) Sit to Stand: Patient Percentage: 60% Details for Transfer Assistance: pt stood at sink from [facility] and supported on front bar.    Exercises     PT Diagnosis:    PT Problem List:   PT Treatment Interventions:     PT Goals Acute Rehab PT Goals Pt will go Supine/Side to Sit: with supervision PT Goal: Supine/Side to Sit - Progress: Progressing toward goal Pt will go Sit to Stand: with min assist PT Goal: Sit to Stand - Progress: Progressing toward goal Pt will go Stand to Sit: with supervision PT Goal: Stand to Sit - Progress: Progressing toward goal Pt will Transfer Bed to Chair/Chair to Bed: with min  assist PT Transfer Goal: Bed to Chair/Chair to Bed - Progress: Progressing toward goal  Visit Information  Last PT Received On: 08/13/12 Assistance Needed: +2    Subjective Data  Subjective: I want to get up.    Cognition  Cognition Arousal/Alertness: Awake/alert Behavior During Session: WFL for tasks performed Cognition - Other Comments: pt was asleep and was confused about time when she woke up    Balance  Static Sitting Balance Static Sitting - Balance Support: Bilateral upper extremity supported Static Sitting - Comment/# of Minutes: min guard for sitting on STEDy.  End of Session PT - End of Session Activity Tolerance: Patient tolerated treatment well;Patient limited by fatigue Patient left: in chair;with call bell/phone within reach Nurse Communication: Mobility status;Need for lift equipment   GP     Rada Hay 08/13/2012, 3:43 PM

## 2012-08-14 DIAGNOSIS — R109 Unspecified abdominal pain: Secondary | ICD-10-CM

## 2012-08-14 DIAGNOSIS — C50019 Malignant neoplasm of nipple and areola, unspecified female breast: Secondary | ICD-10-CM

## 2012-08-14 LAB — COMPREHENSIVE METABOLIC PANEL
ALT: 22 U/L (ref 0–35)
Alkaline Phosphatase: 154 U/L — ABNORMAL HIGH (ref 39–117)
BUN: 14 mg/dL (ref 6–23)
CO2: 16 mEq/L — ABNORMAL LOW (ref 19–32)
Chloride: 107 mEq/L (ref 96–112)
GFR calc Af Amer: >90 mL/min (ref >90)
GFR calc non Af Amer: >90 mL/min (ref >90)
Glucose, Bld: 92 mg/dL (ref 70–99)
Potassium: 3.4 mEq/L — ABNORMAL LOW (ref 3.5–5.1)
Sodium: 133 mEq/L — ABNORMAL LOW (ref 135–145)
Total Bilirubin: 0.5 mg/dL (ref 0.3–1.2)
Total Protein: 4.3 g/dL — ABNORMAL LOW (ref 6.0–8.3)

## 2012-08-14 LAB — CBC WITH DIFFERENTIAL/PLATELET
Basophils Relative: 0 % (ref 0–1)
Eosinophils Absolute: 0 10*3/uL (ref 0.0–0.7)
Eosinophils Relative: 0 % (ref 0–5)
HCT: 37.8 % (ref 36.0–46.0)
Hemoglobin: 12.6 g/dL (ref 12.0–15.0)
Lymphocytes Relative: 15 % (ref 12–46)
MCHC: 33.3 g/dL (ref 30.0–36.0)
Neutro Abs: 4.1 10*3/uL (ref 1.7–7.7)
Neutrophils Relative %: 79 % — ABNORMAL HIGH (ref 43–77)
RBC: 4.74 MIL/uL (ref 3.87–5.11)

## 2012-08-14 LAB — MAGNESIUM: Magnesium: 1.7 mg/dL (ref 1.5–2.5)

## 2012-08-14 MED ORDER — METOPROLOL SUCCINATE ER 50 MG PO TB24
50.0000 mg | ORAL_TABLET | Freq: Every day | ORAL | Status: DC
Start: 2012-08-15 — End: 2012-08-17
  Administered 2012-08-15 – 2012-08-17 (×3): 50 mg via ORAL
  Filled 2012-08-14 (×4): qty 1

## 2012-08-14 MED ORDER — HYDROCODONE-ACETAMINOPHEN 5-325 MG PO TABS
2.0000 | ORAL_TABLET | Freq: Four times a day (QID) | ORAL | Status: DC | PRN
  Administered 2012-08-14: 1 via ORAL
  Filled 2012-08-14 (×2): qty 1

## 2012-08-14 NOTE — Progress Notes (Signed)
Cardiologist:  Delane Ginger, MD   [name] is a 57 yo with a recent Dx of breast cancer, s/p Right mastectomy, getting chemotherapy.   She was admitted 3/25 with nausea, vomitting, and dehydration.  She was diagnosed with atrial fib recently - July 14, 2012.  Subjective:  Patient has been changed to PO diltiazem.  BP is a bit lower.     Objective:  Vital Signs in the last 24 hours: Temp:  [97.3 F (36.3 C)-97.7 F (36.5 C)] 97.3 F (36.3 C) (04/05 0620) Pulse Rate:  [49-109] 49 (04/05 0620) Resp:  [20] 20 (04/05 0620) BP: (87-110)/(47-64) 100/58 mmHg (04/05 0620) SpO2:  [98 %-100 %] 98 % (04/05 0620)  Intake/Output from previous day: 04/04 0701 - 04/05 0700 In: 1369 [P.O.:220; I.V.:1149] Out: 975 [Urine:825; Stool:150] Intake/Output from this shift:    . antiseptic oral rinse  15 mL Mouth Rinse BID  . aspirin EC  81 mg Oral q morning - 10a  . atorvastatin  10 mg Oral QHS  . buPROPion  150 mg Oral q morning - 10a  . cholestyramine  4 g Oral BID  . diltiazem  120 mg Oral Daily  . enoxaparin  40 mg Subcutaneous Daily  . escitalopram  10 mg Oral QHS  . feeding supplement  1 Container Oral BID BM  . insulin aspart  0-15 Units Subcutaneous TID WC  . levothyroxine  100 mcg Oral QAC breakfast  . metoprolol succinate  100 mg Oral Q breakfast  . multivitamin with minerals  1 tablet Oral Daily  . pantoprazole  40 mg Oral Daily  . saccharomyces boulardii  250 mg Oral BID   . 0.9 % sodium chloride with kcl 50 mL/hr at 08/13/12 1936    Physical Exam: The patient appears to be chronically ill  Head and neck exam : unremarkable, patient is bald Chest is clear to percussion and auscultation.  No rales or rhonchi.  IV port in left upper chest Heart :  Irreg. Irreg.  Rate is better  The abdomen is soft and nontender.  Bowel sounds are normoactive.  There is no hepatosplenomegaly or mass.  There are no abdominal bruits.  Extremities reveal no phlebitis. 1 + edema .  Pedal pulses  are good.  There is no cyanosis or clubbing.  Neurologic exam is normal   Integument reveals no rash  Lab Results:  Recent Labs  08/13/12 0555 08/14/12 0545  WBC 6.2 5.2  HGB 12.8 12.6  PLT 68* 69*    Recent Labs  08/13/12 0555 08/14/12 0545  NA 135 133*  K 3.4* 3.4*  CL 109 107  CO2 16* 16*  GLUCOSE 112* 92  BUN 16 14  CREATININE 0.83 0.77   No results found for this basename: TROPONINI, CK, MB,  in the last 72 hours Hepatic Function Panel  Recent Labs  08/14/12 0545  PROT 4.3*  ALBUMIN 1.5*  AST 37  ALT 22  ALKPHOS 154*  BILITOT 0.5   No results found for this basename: CHOL,  in the last 72 hours No results found for this basename: PROTIME,  in the last 72 hours  Imaging: No results found.  Cardiac Studies: Telemetry 3/31:   rapid atrial fib.  Assessment/Plan:  1. Atrial fib with rapid ventricular response.   HR is well controlled but her BP has dropped.  HR is well controlled on current dose of metoprolol and dilt.  Her HR is variable - in the 70-80 range at present.  She has not had any symptoms of dizziness.  We can vary the dose of metoprolol as needed if her HR is too slow.  Currently, she has a platelet count of 68 Her CHADS score is ~ 1 for HTN.  .  I would continue aspirin for now and would consider xarelto as she improves.    No further cardiac recs  Will sign off.  Call for questions.   I will see her in the office in several week ( or she may see my NP- Norma Fredrickson)   LOS: 11 days    [name]. 08/14/2012, 7:58 AM

## 2012-08-14 NOTE — Progress Notes (Signed)
Pt refuses CPAP, RT to monitor and assess as needed.  

## 2012-08-14 NOTE — Progress Notes (Signed)
[facility] INPATIENT PROGRESS NOTE  Name: [name]      MRN: [id number]    Location: 1333/1333-01  Date: 08/14/2012 Time:9:17 AM   Subjective: Interval History:[name] reported that she has mild to moderate diffuse abdominal discomfort.  Pain is crampy, nonradiating, no relation with bowel/bladder movement; no relation with food in take.  She still would like to maintain her rectal tube since she has severe generalized fatigue to get to the bathroom or even for bed pain.  She thinks that her stool is loose but much better than before.  She denied fever, SOB, chest pain, palpitation, bleeding symptoms.  Per her report, she was able to sit in the chair for 2 hours yesterday.   Objective: Vital signs in last 24 hours: Temp:  [97.3 F (36.3 C)-97.7 F (36.5 C)] 97.3 F (36.3 C) (04/05 0620) Pulse Rate:  [49-109] 96 (04/05 0809) Resp:  [20] 20 (04/05 0620) BP: (87-103)/(47-65) 103/65 mmHg (04/05 0809) SpO2:  [98 %-100 %] 98 % (04/05 0620)     PHYSICAL EXAM: Gen: obese woman, in no acute distress. Eyes: No scleral icterus or jaundice. ENT: There was no oropharyngeal lesions. Neck was supple without thyromegaly. Lymphatics: Negative for cervical, supraclavicular, axillary, or inguinal adenopathy.  Respiratory: Lungs were clear bilaterally without wheezing or crackles. Cardiovascular: normal heart rate and rhythm; S1/S2; without murmur, rubs, or gallop. There was 2+ generalized edema. GI: Abdomen was soft, flat, nontender, nondistended, without organomegaly. Musculoskeletal exam: No spinal tenderness on palpation of vertebral spine. Skin exam was without ecchymosis, petechiae. Patient was alert and oriented. Attention was good. Language was appropriate. Mood was normal without depression. Speech was not pressured. Thought content was not tangential.        Studies/Results: Results for orders placed during the hospital encounter of 08/03/12 (from the past 48 hour(s))   GLUCOSE, CAPILLARY     Status: Abnormal   Collection Time    08/12/12 11:37 AM      Result Value Range   Glucose-Capillary 126 (*) 70 - 99 mg/dL   Comment 1 Notify RN    GLUCOSE, CAPILLARY     Status: Abnormal   Collection Time    08/12/12  5:38 PM      Result Value Range   Glucose-Capillary 159 (*) 70 - 99 mg/dL  GLUCOSE, CAPILLARY     Status: Abnormal   Collection Time    08/12/12  9:36 PM      Result Value Range   Glucose-Capillary 143 (*) 70 - 99 mg/dL  CBC WITH DIFFERENTIAL     Status: Abnormal   Collection Time    08/13/12  5:55 AM      Result Value Range   WBC 6.2  4.0 - 10.5 K/uL   RBC 4.76  3.87 - 5.11 MIL/uL   Hemoglobin 12.8  12.0 - 15.0 g/dL   HCT 40.9  81.1 - 91.4 %   MCV 79.4  78.0 - 100.0 fL   MCH 26.9  26.0 - 34.0 pg   MCHC 33.9  30.0 - 36.0 g/dL   RDW 78.2 (*) 95.6 - 21.3 %   Platelets 68 (*) 150 - 400 K/uL   Comment: SPECIMEN CHECKED FOR CLOTS     PLATELET COUNT CONFIRMED BY SMEAR   Neutrophils Relative 82 (*) 43 - 77 %   Lymphocytes Relative 13  12 - 46 %   Monocytes Relative 5  3 - 12 %   Eosinophils Relative 0  0 -  5 %   Basophils Relative 0  0 - 1 %   Neutro Abs 5.1  1.7 - 7.7 K/uL   Lymphs Abs 0.8  0.7 - 4.0 K/uL   Monocytes Absolute 0.3  0.1 - 1.0 K/uL   Eosinophils Absolute 0.0  0.0 - 0.7 K/uL   Basophils Absolute 0.0  0.0 - 0.1 K/uL   RBC Morphology ELLIPTOCYTES    COMPREHENSIVE METABOLIC PANEL     Status: Abnormal   Collection Time    08/13/12  5:55 AM      Result Value Range   Sodium 135  135 - 145 mEq/L   Potassium 3.4 (*) 3.5 - 5.1 mEq/L   Chloride 109  96 - 112 mEq/L   CO2 16 (*) 19 - 32 mEq/L   Glucose, Bld 112 (*) 70 - 99 mg/dL   BUN 16  6 - 23 mg/dL   Creatinine, Ser 1.61  0.50 - 1.10 mg/dL   Calcium 6.8 (*) 8.4 - 10.5 mg/dL   Total Protein 4.3 (*) 6.0 - 8.3 g/dL   Albumin 1.5 (*) 3.5 - 5.2 g/dL   AST 27  0 - 37 U/L   ALT 19  0 - 35 U/L   Alkaline Phosphatase 157 (*) 39 - 117 U/L   Total Bilirubin 0.5  0.3 - 1.2 mg/dL    GFR calc non Af Amer 76 (*) >90 mL/min   GFR calc Af Amer 88 (*) >90 mL/min   Comment:            The eGFR has been calculated     using the CKD EPI equation.     This calculation has not been     validated in all clinical     situations.     eGFR's persistently     <90 mL/min signify     possible Chronic Kidney Disease.  MAGNESIUM     Status: None   Collection Time    08/13/12  5:55 AM      Result Value Range   Magnesium 1.6  1.5 - 2.5 mg/dL  PHOSPHORUS     Status: None   Collection Time    08/13/12  5:55 AM      Result Value Range   Phosphorus 3.1  2.3 - 4.6 mg/dL  GLUCOSE, CAPILLARY     Status: Abnormal   Collection Time    08/13/12  7:21 AM      Result Value Range   Glucose-Capillary 119 (*) 70 - 99 mg/dL  GLUCOSE, CAPILLARY     Status: Abnormal   Collection Time    08/13/12 11:51 AM      Result Value Range   Glucose-Capillary 160 (*) 70 - 99 mg/dL  GLUCOSE, CAPILLARY     Status: Abnormal   Collection Time    08/13/12  5:54 PM      Result Value Range   Glucose-Capillary 125 (*) 70 - 99 mg/dL  GLUCOSE, CAPILLARY     Status: None   Collection Time    08/13/12  9:55 PM      Result Value Range   Glucose-Capillary 89  70 - 99 mg/dL  CBC WITH DIFFERENTIAL     Status: Abnormal   Collection Time    08/14/12  5:45 AM      Result Value Range   WBC 5.2  4.0 - 10.5 K/uL   RBC 4.74  3.87 - 5.11 MIL/uL   Hemoglobin 12.6  12.0 - 15.0 g/dL   HCT  37.8  36.0 - 46.0 %   MCV 79.7  78.0 - 100.0 fL   MCH 26.6  26.0 - 34.0 pg   MCHC 33.3  30.0 - 36.0 g/dL   RDW 16.1 (*) 09.6 - 04.5 %   Platelets 69 (*) 150 - 400 K/uL   Comment: CONSISTENT WITH PREVIOUS RESULT   Neutrophils Relative 79 (*) 43 - 77 %   Lymphocytes Relative 15  12 - 46 %   Monocytes Relative 6  3 - 12 %   Eosinophils Relative 0  0 - 5 %   Basophils Relative 0  0 - 1 %   Neutro Abs 4.1  1.7 - 7.7 K/uL   Lymphs Abs 0.8  0.7 - 4.0 K/uL   Monocytes Absolute 0.3  0.1 - 1.0 K/uL   Eosinophils Absolute 0.0  0.0 -  0.7 K/uL   Basophils Absolute 0.0  0.0 - 0.1 K/uL   RBC Morphology POLYCHROMASIA PRESENT     Comment: BURR CELLS  COMPREHENSIVE METABOLIC PANEL     Status: Abnormal   Collection Time    08/14/12  5:45 AM      Result Value Range   Sodium 133 (*) 135 - 145 mEq/L   Potassium 3.4 (*) 3.5 - 5.1 mEq/L   Chloride 107  96 - 112 mEq/L   CO2 16 (*) 19 - 32 mEq/L   Glucose, Bld 92  70 - 99 mg/dL   BUN 14  6 - 23 mg/dL   Creatinine, Ser 4.09  0.50 - 1.10 mg/dL   Calcium 7.1 (*) 8.4 - 10.5 mg/dL   Total Protein 4.3 (*) 6.0 - 8.3 g/dL   Albumin 1.5 (*) 3.5 - 5.2 g/dL   AST 37  0 - 37 U/L   ALT 22  0 - 35 U/L   Alkaline Phosphatase 154 (*) 39 - 117 U/L   Total Bilirubin 0.5  0.3 - 1.2 mg/dL   GFR calc non Af Amer >90  >90 mL/min   GFR calc Af Amer >90  >90 mL/min   Comment:            The eGFR has been calculated     using the CKD EPI equation.     This calculation has not been     validated in all clinical     situations.     eGFR's persistently     <90 mL/min signify     possible Chronic Kidney Disease.  MAGNESIUM     Status: None   Collection Time    08/14/12  5:45 AM      Result Value Range   Magnesium 1.7  1.5 - 2.5 mg/dL  PHOSPHORUS     Status: None   Collection Time    08/14/12  5:45 AM      Result Value Range   Phosphorus 3.0  2.3 - 4.6 mg/dL   No results found.   MEDICATIONS: reviewed.     PROBLEM LIST:  1.  Breast cancer:  status post right mastectomy under the care of Dr. 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 Lymphovascular and appearing neural invasion was identified. 2 of 18 lymph nodes were involved. Margins were clear. Pathologic staging pT3, pN1a, stage IIIA. (Note the original clinical staging was IIB.)   She tolerated adjuvant chemo Taxotere/Cytoxan poorly x 2 cycle s(last  on 07/29/12).  No plan for further chemo  per Dr. [name].  2.  Severe diarrhea:   c diff negative; still putting out > 500 cc/day via flexiseal; continue to replace fluids and electrolytes; d/c'd octreotide; now on questran.    3.  Dehydration:  Received aggressive IVF; now with generalized edema.  4.  Neutropenic fever:  s/p3 days primaxin/ vanco, s/p 4 days cipro (7 days ABX); neutrophils now recovered; urine grew klebsiella and e coli, both cipro sensitive; ABX d/c'd 4/3.  Now resolved neutropenia and no more fever.  5.  Diabetes mellitus. 6.  Cirrhosis/fatty liver.  7.  Afib:  On Metoprolol XR 100mg  PO daily.  8.  Severe deconditioning. 9.  Hypothyroidism:  On Synthroid 10.  Depression:  On Lexapro/Welbutrin.      PLAN:   - Pain med for abdominal pain today.  There was no concerning symptoms for further work up.  - I will decrease Metoprolol since her heart rate has been under control but still hypotensive. - Once blood pressure improves (with lower dose of Metoprolol), we may consider diuresis.  - I encouraged pt to get into chair today with nursing's aid.  - Pending disposition to SNF next week.

## 2012-08-15 DIAGNOSIS — D509 Iron deficiency anemia, unspecified: Secondary | ICD-10-CM

## 2012-08-15 LAB — GLUCOSE, CAPILLARY
Glucose-Capillary: 86 mg/dL (ref 70–99)
Glucose-Capillary: 86 mg/dL (ref 70–99)
Glucose-Capillary: 98 mg/dL (ref 70–99)

## 2012-08-15 LAB — COMPREHENSIVE METABOLIC PANEL
ALT: 24 U/L (ref 0–35)
Alkaline Phosphatase: 166 U/L — ABNORMAL HIGH (ref 39–117)
BUN: 12 mg/dL (ref 6–23)
CO2: 17 mEq/L — ABNORMAL LOW (ref 19–32)
GFR calc Af Amer: >90 mL/min (ref >90)
GFR calc non Af Amer: >90 mL/min (ref >90)
Glucose, Bld: 75 mg/dL (ref 70–99)
Potassium: 3.2 mEq/L — ABNORMAL LOW (ref 3.5–5.1)
Sodium: 135 mEq/L (ref 135–145)
Total Protein: 4.4 g/dL — ABNORMAL LOW (ref 6.0–8.3)

## 2012-08-15 LAB — CBC WITH DIFFERENTIAL/PLATELET
Eosinophils Absolute: 0 10*3/uL (ref 0.0–0.7)
Eosinophils Relative: 0 % (ref 0–5)
Hemoglobin: 12.5 g/dL (ref 12.0–15.0)
Lymphocytes Relative: 16 % (ref 12–46)
Lymphs Abs: 0.7 10*3/uL (ref 0.7–4.0)
MCH: 27 pg (ref 26.0–34.0)
MCV: 80.6 fL (ref 78.0–100.0)
Monocytes Relative: 7 % (ref 3–12)
Platelets: 74 10*3/uL — ABNORMAL LOW (ref 150–400)
RBC: 4.63 MIL/uL (ref 3.87–5.11)
WBC: 4.6 10*3/uL (ref 4.0–10.5)

## 2012-08-15 LAB — MAGNESIUM: Magnesium: 1.7 mg/dL (ref 1.5–2.5)

## 2012-08-15 NOTE — Progress Notes (Signed)
Pt alert and oriented. Appetite improving today. Husband brought in food from a restaurent which patient ate. Port reaccessed. Flex-a-seal draining dark brown liquid stool. Sat up in a chair for a short period of time. Husband stays at bedside.

## 2012-08-15 NOTE — Progress Notes (Signed)
Pt requested CPAP to be setup tonight, Pt placed on Auto titrate settings because pt was unsure of settings, Pt has nasal prongs from home, Pt tolerating well at this time, RT to monitor and assess as needed.

## 2012-08-15 NOTE — Progress Notes (Signed)
[facility] INPATIENT PROGRESS NOTE  Name: [name]      MRN: [id number]    Location: 1333/1333-01  Date: 08/15/2012 Time:11:37 AM   Subjective: Interval History:[name] week and is hardly getting out of bed. She still is not eating much. However she did try some boiled eggs and only a one half of that. She is very concerned as is her husband as to why she is so weak. She is very concerned about her eating. Patient does have calorie count going on. I have encouraged her to be out of bed. She states that she is trying but is too weak. They are concerned about plans for upcoming week and as to when she is going to be going to a rehabilitation facility.   Objective: Vital signs in last 24 hours: Temp:  [97.3 F (36.3 C)-97.9 F (36.6 C)] 97.9 F (36.6 C) (04/06 0511) Pulse Rate:  [64-87] 80 (04/06 0811) Resp:  [12] 12 (04/06 0511) BP: (94-110)/(58-73) 110/64 mmHg (04/06 0811) SpO2:  [97 %-100 %] 97 % (04/06 0511)     PHYSICAL EXAM: Gen: obese woman, in no acute distress. Eyes: No scleral icterus or jaundice. ENT: There was no oropharyngeal lesions. Neck was supple without thyromegaly. Lymphatics: Negative for cervical, supraclavicular, axillary, or inguinal adenopathy.  Respiratory: Lungs were clear bilaterally without wheezing or crackles. Cardiovascular: normal heart rate and rhythm; S1/S2; without murmur, rubs, or gallop. There was 2+ generalized edema. GI: Abdomen was soft, flat, nontender, nondistended, without organomegaly. Musculoskeletal exam: No spinal tenderness on palpation of vertebral spine. Skin exam was without ecchymosis, petechiae. Patient was alert and oriented. Attention was good. Language was appropriate. Mood was normal without depression. Speech was not pressured. Thought content was not tangential.        Studies/Results: Results for orders placed during the hospital encounter of 08/03/12 (from the past 48 hour(s))  GLUCOSE, CAPILLARY      Status: Abnormal   Collection Time    08/13/12 11:51 AM      Result Value Range   Glucose-Capillary 160 (*) 70 - 99 mg/dL  GLUCOSE, CAPILLARY     Status: Abnormal   Collection Time    08/13/12  5:54 PM      Result Value Range   Glucose-Capillary 125 (*) 70 - 99 mg/dL  GLUCOSE, CAPILLARY     Status: None   Collection Time    08/13/12  9:55 PM      Result Value Range   Glucose-Capillary 89  70 - 99 mg/dL  CBC WITH DIFFERENTIAL     Status: Abnormal   Collection Time    08/14/12  5:45 AM      Result Value Range   WBC 5.2  4.0 - 10.5 K/uL   RBC 4.74  3.87 - 5.11 MIL/uL   Hemoglobin 12.6  12.0 - 15.0 g/dL   HCT 95.2  84.1 - 32.4 %   MCV 79.7  78.0 - 100.0 fL   MCH 26.6  26.0 - 34.0 pg   MCHC 33.3  30.0 - 36.0 g/dL   RDW 40.1 (*) 02.7 - 25.3 %   Platelets 69 (*) 150 - 400 K/uL   Comment: CONSISTENT WITH PREVIOUS RESULT   Neutrophils Relative 79 (*) 43 - 77 %   Lymphocytes Relative 15  12 - 46 %   Monocytes Relative 6  3 - 12 %   Eosinophils Relative 0  0 - 5 %   Basophils Relative 0  0 - 1 %   Neutro Abs 4.1  1.7 - 7.7 K/uL   Lymphs Abs 0.8  0.7 - 4.0 K/uL   Monocytes Absolute 0.3  0.1 - 1.0 K/uL   Eosinophils Absolute 0.0  0.0 - 0.7 K/uL   Basophils Absolute 0.0  0.0 - 0.1 K/uL   RBC Morphology POLYCHROMASIA PRESENT     Comment: BURR CELLS  COMPREHENSIVE METABOLIC PANEL     Status: Abnormal   Collection Time    08/14/12  5:45 AM      Result Value Range   Sodium 133 (*) 135 - 145 mEq/L   Potassium 3.4 (*) 3.5 - 5.1 mEq/L   Chloride 107  96 - 112 mEq/L   CO2 16 (*) 19 - 32 mEq/L   Glucose, Bld 92  70 - 99 mg/dL   BUN 14  6 - 23 mg/dL   Creatinine, Ser 1.61  0.50 - 1.10 mg/dL   Calcium 7.1 (*) 8.4 - 10.5 mg/dL   Total Protein 4.3 (*) 6.0 - 8.3 g/dL   Albumin 1.5 (*) 3.5 - 5.2 g/dL   AST 37  0 - 37 U/L   ALT 22  0 - 35 U/L   Alkaline Phosphatase 154 (*) 39 - 117 U/L   Total Bilirubin 0.5  0.3 - 1.2 mg/dL   GFR calc non Af Amer >90  >90 mL/min   GFR calc Af Amer >90   >90 mL/min   Comment:            The eGFR has been calculated     using the CKD EPI equation.     This calculation has not been     validated in all clinical     situations.     eGFR's persistently     <90 mL/min signify     possible Chronic Kidney Disease.  MAGNESIUM     Status: None   Collection Time    08/14/12  5:45 AM      Result Value Range   Magnesium 1.7  1.5 - 2.5 mg/dL  PHOSPHORUS     Status: None   Collection Time    08/14/12  5:45 AM      Result Value Range   Phosphorus 3.0  2.3 - 4.6 mg/dL  GLUCOSE, CAPILLARY     Status: Abnormal   Collection Time    08/14/12  1:09 PM      Result Value Range   Glucose-Capillary 112 (*) 70 - 99 mg/dL  CBC WITH DIFFERENTIAL     Status: Abnormal   Collection Time    08/15/12  6:00 AM      Result Value Range   WBC 4.6  4.0 - 10.5 K/uL   RBC 4.63  3.87 - 5.11 MIL/uL   Hemoglobin 12.5  12.0 - 15.0 g/dL   HCT 09.6  04.5 - 40.9 %   MCV 80.6  78.0 - 100.0 fL   MCH 27.0  26.0 - 34.0 pg   MCHC 33.5  30.0 - 36.0 g/dL   RDW 81.1 (*) 91.4 - 78.2 %   Platelets 74 (*) 150 - 400 K/uL   Comment: CONSISTENT WITH PREVIOUS RESULT   Neutrophils Relative 77  43 - 77 %   Neutro Abs 3.6  1.7 - 7.7 K/uL   Lymphocytes Relative 16  12 - 46 %   Lymphs Abs 0.7  0.7 - 4.0 K/uL   Monocytes Relative 7  3 - 12 %   Monocytes Absolute  0.3  0.1 - 1.0 K/uL   Eosinophils Relative 0  0 - 5 %   Eosinophils Absolute 0.0  0.0 - 0.7 K/uL   Basophils Relative 0  0 - 1 %   Basophils Absolute 0.0  0.0 - 0.1 K/uL  COMPREHENSIVE METABOLIC PANEL     Status: Abnormal   Collection Time    08/15/12  6:00 AM      Result Value Range   Sodium 135  135 - 145 mEq/L   Potassium 3.2 (*) 3.5 - 5.1 mEq/L   Chloride 109  96 - 112 mEq/L   CO2 17 (*) 19 - 32 mEq/L   Glucose, Bld 75  70 - 99 mg/dL   BUN 12  6 - 23 mg/dL   Creatinine, Ser 1.61  0.50 - 1.10 mg/dL   Calcium 7.0 (*) 8.4 - 10.5 mg/dL   Total Protein 4.4 (*) 6.0 - 8.3 g/dL   Albumin 1.4 (*) 3.5 - 5.2 g/dL   AST  41 (*) 0 - 37 U/L   ALT 24  0 - 35 U/L   Alkaline Phosphatase 166 (*) 39 - 117 U/L   Total Bilirubin 0.5  0.3 - 1.2 mg/dL   GFR calc non Af Amer >90  >90 mL/min   GFR calc Af Amer >90  >90 mL/min   Comment:            The eGFR has been calculated     using the CKD EPI equation.     This calculation has not been     validated in all clinical     situations.     eGFR's persistently     <90 mL/min signify     possible Chronic Kidney Disease.  MAGNESIUM     Status: None   Collection Time    08/15/12  6:00 AM      Result Value Range   Magnesium 1.7  1.5 - 2.5 mg/dL  PHOSPHORUS     Status: None   Collection Time    08/15/12  6:00 AM      Result Value Range   Phosphorus 3.2  2.3 - 4.6 mg/dL   No results found.   MEDICATIONS: reviewed.     PROBLEM LIST:  1.  Breast cancer:  status post right mastectomy under the care of Dr. 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 Lymphovascular and appearing neural invasion was identified. 2 of 18 lymph nodes were involved. Margins were clear. Pathologic staging pT3, pN1a, stage IIIA. (Note the original clinical staging was IIB.)   She tolerated adjuvant chemo Taxotere/Cytoxan poorly x 2 cycle s(last on 07/29/12).  No plan for further chemo per Dr. [name].  2.  Severe diarrhea:   c diff negative; still putting out > 500 cc/day via flexiseal; continue to replace fluids and electrolytes; d/c'd octreotide; now on questran.    3.  Dehydration:  Received aggressive IVF; now with generalized edema. She still does have IV fluids running but at 50 cc an hour. 4.  Neutropenic fever:  s/p3 days primaxin/ vanco, s/p 4 days cipro (7 days ABX); neutrophils now recovered; urine grew klebsiella and e coli, both cipro sensitive; ABX d/c'd 4/3.  Now resolved neutropenia and no more fever.  5.  Diabetes mellitus. 6.   Cirrhosis/fatty liver.  7.  Afib:   8.  Severe  deconditioning. Seen by PT/OT 9.  Hypothyroidism:  On Synthroid 10.  Depression:  On Lexapro/Welbutrin.      PLAN:  #1 continue IV fluids and encourage her to eat more.  #2 disposition patient will most likely go to skilled nursing facility.  #3 patient's husband would like to speak to Dr. [name] directly regarding plans in the next few weeks.  #4 patient and husband both feel they did not want any more chemotherapy. And per Dr. Darrall Dears notes there are no further plans for chemotherapy.  Drue Second, MD Medical/Oncology [facility] [phone] (beeper) [phone] (Office)  08/15/2012, 11:42 AM

## 2012-08-16 LAB — COMPREHENSIVE METABOLIC PANEL
AST: 41 U/L — ABNORMAL HIGH (ref 0–37)
CO2: 17 mEq/L — ABNORMAL LOW (ref 19–32)
Calcium: 7.4 mg/dL — ABNORMAL LOW (ref 8.4–10.5)
Creatinine, Ser: 0.73 mg/dL (ref 0.50–1.10)
GFR calc non Af Amer: >90 mL/min (ref >90)
Total Protein: 4.5 g/dL — ABNORMAL LOW (ref 6.0–8.3)

## 2012-08-16 LAB — CBC WITH DIFFERENTIAL/PLATELET
Basophils Absolute: 0 10*3/uL (ref 0.0–0.1)
Eosinophils Absolute: 0 10*3/uL (ref 0.0–0.7)
Lymphocytes Relative: 18 % (ref 12–46)
MCHC: 33.3 g/dL (ref 30.0–36.0)
Monocytes Relative: 8 % (ref 3–12)
Neutro Abs: 3.4 10*3/uL (ref 1.7–7.7)
Neutrophils Relative %: 74 % (ref 43–77)
Platelets: 79 10*3/uL — ABNORMAL LOW (ref 150–400)
RDW: 22.3 % — ABNORMAL HIGH (ref 11.5–15.5)
WBC: 4.6 10*3/uL (ref 4.0–10.5)

## 2012-08-16 LAB — GLUCOSE, CAPILLARY: Glucose-Capillary: 106 mg/dL — ABNORMAL HIGH (ref 70–99)

## 2012-08-16 LAB — PHOSPHORUS: Phosphorus: 3 mg/dL (ref 2.3–4.6)

## 2012-08-16 MED ORDER — ACETAMINOPHEN 325 MG PO TABS
650.0000 mg | ORAL_TABLET | ORAL | Status: DC | PRN
  Administered 2012-08-16: 650 mg via ORAL

## 2012-08-16 MED ORDER — LETROZOLE 2.5 MG PO TABS
2.5000 mg | ORAL_TABLET | Freq: Every day | ORAL | Status: DC
  Administered 2012-08-16 – 2012-08-17 (×2): 2.5 mg via ORAL
  Filled 2012-08-16 (×3): qty 1

## 2012-08-16 NOTE — Progress Notes (Signed)
Nutrition Brief Note  Met with pt and husband who report pt eating better over the weekend. No intake recorded in calorie count envelope and calorie count d/c today. Husband reports pt has had bites of fried chicken and bites of [employer] pasta over the weekend in addition to some salads. Husband reports pt ate 1/2 of jelly biscuit this morning. Pt refusing to drink any nutritional supplements. Pt agreeable for RD to order her cheese and crackers but refuses any other food at this time.   Levon Hedger MS, RD, LDN [phone] Pager [phone] After Hours Pager

## 2012-08-16 NOTE — Progress Notes (Signed)
Clinical Social Worker provided pt and husband with bed offers.  Awaiting additional bed offers, but plans to dc to SNF tomorrow.    Tamala Julian, MSW, LCSW Clinical Social Worker [facility] [phone]

## 2012-08-16 NOTE — Progress Notes (Signed)
No show--n hosp

## 2012-08-16 NOTE — Progress Notes (Addendum)
Rectal tube dc'd this AM. Pt incontinent several times of black loose stool. As the day went on, stool was becoming soft and formed. Pt using the walker to get to the bedside commode and chair, with 2 assist. Tolerated well. Pt was  Awake and alert most of the day. C/o of headache, given 2 tylenol. Headache resolved. Husband requesting for wife to go to [facility]. IV decreased to [facility]. Inner thighs reddened  Due to incontinence, barrier cream applied.

## 2012-08-16 NOTE — Progress Notes (Signed)
[name]   DOB:[birth year]   AV#:[id number]   NWG#:[id number]  Subjective: [name] remains weak; she will need SNF rather than inpt Rehab; discussed with her and husband.   Objective: middle aged white woman examined in bed Filed Vitals:   08/16/12 0525  BP: 138/83  Pulse: 98  Temp: 97.3 F (36.3 C)  Resp: 20    Body mass index is 31.74 kg/(m^2).  Intake/Output Summary (Last 24 hours) at 08/16/12 0843 Last data filed at 08/16/12 0600  Gross per 24 hour  Intake   3150 ml  Output    550 ml  Net   2600 ml   STOOL: 700 cc/24 hrs per RN   Oropharynx clear  No peripheral adenopathy  Lungs auscultated anteriorly, no rales or wheezes  Heart irregular rate, no murmur appreciated  Abdomen soft, NT, positive BS  MSK bilateral 2+ ankle edema  Neuro nonfocal, oriented x3 with some hesitation on time  Breast exam: deferred  Stool <200 cc/24 hr  CBG (last 3)   Recent Labs  08/15/12 1641 08/15/12 2124 08/16/12 0802  GLUCAP 86 98 103*     Labs:  Lab Results  Component Value Date   WBC 4.6 08/16/2012   HGB 12.6 08/16/2012   HCT 37.8 08/16/2012   MCV 80.1 08/16/2012   PLT 79* 08/16/2012   NEUTROABS 3.4 08/16/2012    @LASTCHEMISTRY @  Urine Studies No results found for this basename: UACOL, UAPR, USPG, UPH, UTP, UGL, UKET, UBIL, UHGB, UNIT, UROB, ULEU, UEPI, UWBC, URBC, UBAC, CAST, CRYS, UCOM, BILUA,  in the last 72 hours  Basic Metabolic Panel:  Recent Labs Lab 08/12/12 0605 08/13/12 0555 08/14/12 0545 08/15/12 0600 08/16/12 0505  NA 133* 135 133* 135 132*  K 3.7 3.4* 3.4* 3.2* 3.4*  CL 107 109 107 109 106  CO2 15* 16* 16* 17* 17*  GLUCOSE 127* 112* 92 75 77  BUN 16 16 14 12 11   CREATININE 0.78 0.83 0.77 0.75 0.73  CALCIUM 6.7* 6.8* 7.1* 7.0* 7.4*  MG 1.5 1.6 1.7 1.7 1.7  PHOS 3.8 3.1 3.0 3.2 3.0   GFR Estimated Creatinine Clearance: 80.3 ml/min (by C-G formula based on Cr of 0.73). Liver Function Tests:  Recent Labs Lab 08/12/12 0605 08/13/12 0555  08/14/12 0545 08/15/12 0600 08/16/12 0505  AST 29 27 37 41* 41*  ALT 19 19 22 24 27   ALKPHOS 130* 157* 154* 166* 184*  BILITOT 0.4 0.5 0.5 0.5 0.5  PROT 3.9* 4.3* 4.3* 4.4* 4.5*  ALBUMIN 1.3* 1.5* 1.5* 1.4* 1.5*   No results found for this basename: LIPASE, AMYLASE,  in the last 168 hours No results found for this basename: AMMONIA,  in the last 168 hours Coagulation profile No results found for this basename: INR, PROTIME,  in the last 168 hours  CBC:  Recent Labs Lab 08/12/12 0630 08/13/12 0555 08/14/12 0545 08/15/12 0600 08/16/12 0505  WBC 4.9 6.2 5.2 4.6 4.6  NEUTROABS 3.9 5.1 4.1 3.6 3.4  HGB 12.0 12.8 12.6 12.5 12.6  HCT 35.5* 37.8 37.8 37.3 37.8  MCV 79.1 79.4 79.7 80.6 80.1  PLT 61* 68* 69* 74* 79*   Cardiac Enzymes: No results found for this basename: CKTOTAL, CKMB, CKMBINDEX, TROPONINI,  in the last 168 hours BNP: No components found with this basename: POCBNP,  CBG:  Recent Labs Lab 08/15/12 0739 08/15/12 1135 08/15/12 1641 08/15/12 2124 08/16/12 0802  GLUCAP 75 86 86 98 103*   D-Dimer No results found for this basename: DDIMER,  in the last 72 hours Hgb A1c No results found for this basename: HGBA1C,  in the last 72 hours Lipid Profile No results found for this basename: CHOL, HDL, LDLCALC, TRIG, CHOLHDL, LDLDIRECT,  in the last 72 hours Thyroid function studies No results found for this basename: TSH, T4TOTAL, FREET3, T3FREE, THYROIDAB,  in the last 72 hours Anemia work up No results found for this basename: VITAMINB12, FOLATE, FERRITIN, TIBC, IRON, RETICCTPCT,  in the last 72 hours Microbiology Recent Results (from the past 240 hour(s))  MRSA PCR SCREENING     Status: None   Collection Time    08/07/12  3:14 PM      Result Value Range Status   MRSA by PCR NEGATIVE  NEGATIVE Final   Comment:            The GeneXpert MRSA Assay (FDA     approved for NASAL specimens     only), is one component of a     comprehensive MRSA colonization      surveillance program. It is not     intended to diagnose MRSA     infection nor to guide or     monitor treatment for     MRSA infections.      Studies:  No results found.  Assessment/ Plan: 57 y.o. [name] currently day 12, cycle 2 chemotherapy, admitted with severe diarrhea, vomiting, dehydration, multiple electrolyte abnormalities, confusion and severe neutropenia with fever   (1) status post right mastectomy under the care of Dr. 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 Lymphovascular and appearing neural invasion was identified. 2 of 18 lymph nodes were involved. Margins were clear. Pathologic staging pT3, pN1a, stage IIIA. (Note the original clinical staging was IIB.)   (2) treated in the adjuvant setting with docetaxel/ cyclophosphamide. First treatment was given on 07/06/2012, very poorly tolerated. We decreased her doses by 15% with cycle 2, given 07/29/2012, but tolerance still very poor. No further chemotherapy planned .  (3) patient will need postmastectomy radiation, which will be followed by antiestrogen therapy.   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   (5) diabetes-- currently on SSI--CBGs acceptable  (6) atrial fib.--controlled on cardizem and beta blocker--low BP has been an issue; cardizem dose was decreased 04/03  (7) fever-neutropenia: s/p3 days primaxin/ vanco, s/p 4 days cipro (7 days ABX); neutrophils now recovered; urine grew klebsiella and e coli, both cipro sensitive; ABX d/c'd 4/3  (8) diarrhea: c diff negative; still putting out > 500 cc/day via flexiseal; continue to replace fluids and electrolytes; d/c'd octreotide; on questran; will d/c flexiseal and try to get to [facility] QID;  (9) Full code.  (10) nutrition remains a limiting  issue--started calorie counts 04/03  (11) DVT prophylaxis: in place  (12)  Deconditioning: severe; PT/ OT on board; plan is d/c to SNF for rehab  (13) d/c planning: in progress; will try to d/c IVF and Foley prior to d/c; will continue SSI for now  (14) letrozole started 08/16/2012       [name] C 08/16/2012

## 2012-08-16 NOTE — Progress Notes (Signed)
Clinical Social Work Department BRIEF PSYCHOSOCIAL ASSESSMENT 08/16/2012  Patient:  [name], [name]     Account Number:  [id number]     Admit date:  08/03/2012  Clinical Social Worker:  Larene Beach  Date/Time:  08/16/2012 11:00 AM  Referred by:  Physician  Date Referred:  08/16/2012 Referred for  SNF Placement   Other Referral:   Interview type:  Patient Other interview type:   Also discussed discharge plans with pt's husband.    PSYCHOSOCIAL DATA Living Status:  HUSBAND Admitted from facility:   Level of care:   Primary support name:  [name] Primary support relationship to patient:  SPOUSE Degree of support available:   strong support and involved in pt's care.    CURRENT CONCERNS Current Concerns  Post-Acute Placement   Other Concerns:    SOCIAL WORK ASSESSMENT / PLAN Clinical Social Worker was consulted for new SNF placement. CSW met with pt and spoke with pt's husband via phone to assess for needs and offer support with discharge plans. Pt was living at home prior to admission, but will now need higher level of care.  Pt, pt's husband, and CSW discussed SNF search  process and provided them with a list of SNF. [facility], but they were agreeable to CSW extending the search to all of [facility].  CSW complete FL2, pasarr, and completed SNF search.   Assessment/plan status:  Other - See comment Other assessment/ plan:   CSW completed SNF search and will update pt and pt's husband with bed offers.   Information/referral to community resources:    PATIENT'S/FAMILY'S RESPONSE TO PLAN OF CARE: Pt and pt's husband are agreeable to SNF placement.  Their [facility] choice [facility], but are open to other bed offers.  CSW will continue to follow and offer support.   Tamala Julian, MSW, LCSW Clinical Social Worker [facility] [phone]

## 2012-08-16 NOTE — Progress Notes (Signed)
Patient requesting Tylenol for headache.  Currently only has Vicodin ordered and she states that makes her too sleepy.  Dr. Cyndie Chime notified and order received for Tylenol.  [name] [employer]  08/16/2012  6:16 PM

## 2012-08-17 ENCOUNTER — Ambulatory Visit: Payer: 59

## 2012-08-17 ENCOUNTER — Ambulatory Visit: Payer: 59 | Admitting: Physician Assistant

## 2012-08-17 ENCOUNTER — Other Ambulatory Visit: Payer: 59 | Admitting: Lab

## 2012-08-17 ENCOUNTER — Other Ambulatory Visit: Payer: Self-pay | Admitting: Oncology

## 2012-08-17 LAB — COMPREHENSIVE METABOLIC PANEL
ALT: 32 U/L (ref 0–35)
AST: 43 U/L — ABNORMAL HIGH (ref 0–37)
Albumin: 1.5 g/dL — ABNORMAL LOW (ref 3.5–5.2)
Alkaline Phosphatase: 192 U/L — ABNORMAL HIGH (ref 39–117)
CO2: 17 mEq/L — ABNORMAL LOW (ref 19–32)
Chloride: 104 mEq/L (ref 96–112)
GFR calc non Af Amer: >90 mL/min (ref >90)
Potassium: 3.1 mEq/L — ABNORMAL LOW (ref 3.5–5.1)
Total Bilirubin: 0.6 mg/dL (ref 0.3–1.2)

## 2012-08-17 LAB — CBC WITH DIFFERENTIAL/PLATELET
Basophils Relative: 0 % (ref 0–1)
Eosinophils Absolute: 0 10*3/uL (ref 0.0–0.7)
HCT: 38.7 % (ref 36.0–46.0)
Hemoglobin: 13.2 g/dL (ref 12.0–15.0)
MCH: 27.3 pg (ref 26.0–34.0)
MCHC: 34.1 g/dL (ref 30.0–36.0)
Monocytes Absolute: 0.4 10*3/uL (ref 0.1–1.0)
Neutro Abs: 4.4 10*3/uL (ref 1.7–7.7)

## 2012-08-17 LAB — GLUCOSE, CAPILLARY
Glucose-Capillary: 80 mg/dL (ref 70–99)
Glucose-Capillary: 75 mg/dL (ref 70–99)

## 2012-08-17 MED ORDER — BIOTENE DRY MOUTH MT LIQD: 15.0000 mL | Freq: Two times a day (BID) | OROMUCOSAL | Status: DC | PRN

## 2012-08-17 MED ORDER — HYDROCODONE-ACETAMINOPHEN 5-325 MG PO TABS: 1.0000 | ORAL_TABLET | Freq: Four times a day (QID) | ORAL | Status: DC | PRN

## 2012-08-17 MED ORDER — BOOST / RESOURCE BREEZE PO LIQD: 1.0000 | Freq: Two times a day (BID) | ORAL | Status: DC

## 2012-08-17 MED ORDER — SODIUM CHLORIDE 0.9 % IV SOLN: 75.0000 mL/h | INTRAVENOUS | Status: DC

## 2012-08-17 MED ORDER — ADULT MULTIVITAMIN W/MINERALS CH: 1.0000 | ORAL_TABLET | Freq: Every day | ORAL | Status: DC

## 2012-08-17 MED ORDER — CHOLESTYRAMINE 4 G PO PACK: 4.0000 g | PACK | Freq: Two times a day (BID) | ORAL | Status: DC

## 2012-08-17 MED ORDER — ENOXAPARIN SODIUM 40 MG/0.4ML ~~LOC~~ SOLN: 40.0000 mg | Freq: Every day | SUBCUTANEOUS | Status: DC

## 2012-08-17 MED ORDER — HEPARIN SOD (PORK) LOCK FLUSH 100 UNIT/ML IV SOLN
INTRAVENOUS | Status: AC
  Administered 2012-08-17: 12:00:00
  Filled 2012-08-17: qty 5

## 2012-08-17 MED ORDER — POTASSIUM CHLORIDE CRYS ER 20 MEQ PO TBCR
20.0000 meq | EXTENDED_RELEASE_TABLET | Freq: Two times a day (BID) | ORAL | Status: DC
  Administered 2012-08-17: 20 meq via ORAL
  Filled 2012-08-17 (×2): qty 1

## 2012-08-17 MED ORDER — LETROZOLE 2.5 MG PO TABS: 2.5000 mg | ORAL_TABLET | Freq: Every day | ORAL | Status: DC

## 2012-08-17 MED ORDER — SODIUM CHLORIDE 0.9 % IJ SOLN: 10.0000 mL | INTRAMUSCULAR | Status: DC | PRN

## 2012-08-17 MED ORDER — INSULIN ASPART 100 UNIT/ML ~~LOC~~ SOLN: 0.0000 [IU] | Freq: Three times a day (TID) | SUBCUTANEOUS | Status: DC

## 2012-08-17 NOTE — Progress Notes (Signed)
Clinical Social Work Department CLINICAL SOCIAL WORK PLACEMENT NOTE 08/17/2012  Patient:  [name], [name]  Account Number:  [id number] Admit date:  08/03/2012  Clinical Social Worker:  Tamala Julian, [place]  Date/time:  08/16/2012 03:30 PM  Clinical Social Work is seeking post-discharge placement for this patient at the following level of care:   SKILLED NURSING   (*CSW will update this form in Epic as items are completed)   08/16/2012  Patient/family provided with [facility] Department of Clinical Social Work's list of facilities offering this level of care within the geographic area requested by the patient (or if unable, by the patient's family).  08/16/2012  Patient/family informed of their freedom to choose among providers that offer the needed level of care, that participate in Medicare, Medicaid or managed care program needed by the patient, have an available bed and are willing to accept the patient.  08/16/2012  Patient/family informed of [facility]' ownership interest in [facility], as well as of the fact that they are under no obligation to receive care at this facility.  PASARR submitted to EDS on 08/16/2012 PASARR number received from EDS on 08/16/2012  FL2 transmitted to all facilities in geographic area requested by pt/family on  08/16/2012 FL2 transmitted to all facilities within larger geographic area on   Patient informed that his/her managed care company has contracts with or will negotiate with  certain facilities, including the following:     Patient/family informed of bed offers received:  08/16/2012 Patient chooses bed at [facility] Physician recommends and patient chooses bed at    Patient to be transferred to [facility] on  08/17/2012 Patient to be transferred to facility by ptar  Tamala Julian, MSW, LCSW Clinical Social Worker [facility] [phone]

## 2012-08-17 NOTE — Discharge Summary (Signed)
Physician Discharge Summary  Patient ID: [name] MRN: [id number] DOB/AGE: 59-25-1957 59 y.o.  Admit date: 08/03/2012 Discharge date: 08/17/2012  Primary Care Physician:  [name]., NP   Discharge Diagnoses:  Hypokalemia, hypomagnesemia, hypophosphatemia, dehydration, malnutrition, severe deconditioning, atrial fibrillation  Present on Admission:  . A-fib . Cancer of breast . HYPOTHYROIDISM . Neutropenic fever . Diarrhea  Discharge Medications:    Medication List    STOP taking these medications       dexamethasone 4 MG tablet  Commonly known as:  DECADRON     LORazepam 0.5 MG tablet  Commonly known as:  ATIVAN     ondansetron 4 MG tablet  Commonly known as:  ZOFRAN     PRESCRIPTION MEDICATION     prochlorperazine 10 MG tablet  Commonly known as:  COMPAZINE     saxagliptin HCl 5 MG Tabs tablet  Commonly known as:  ONGLYZA      TAKE these medications       antiseptic oral rinse Liqd  15 mLs by Mouth Rinse route 2 (two) times daily as needed.     aspirin EC 81 MG tablet  Take 81 mg by mouth every morning.     atorvastatin 10 MG tablet  Commonly known as:  LIPITOR  Take 10 mg by mouth at bedtime.     buPROPion 150 MG 12 hr tablet  Commonly known as:  WELLBUTRIN SR  Take 150 mg by mouth every morning.     cholestyramine 4 G packet  Commonly known as:  QUESTRAN  Take 1 packet (4 g total) by mouth 2 (two) times daily.     diltiazem 120 MG 24 hr capsule  Commonly known as:  CARDIZEM CD  Take 120 mg by mouth every morning.     enoxaparin 40 MG/0.4ML injection  Commonly known as:  LOVENOX  Inject 0.4 mLs (40 mg total) into the skin daily.     escitalopram 10 MG tablet  Commonly known as:  LEXAPRO  Take 10 mg by mouth at bedtime.     feeding supplement Liqd  Take 1 Container by mouth 2 (two) times daily between meals.     insulin aspart 100 UNIT/ML injection  Commonly known as:  novoLOG  Inject 0-15 Units into the skin 3  (three) times daily with meals.     letrozole 2.5 MG tablet  Commonly known as:  FEMARA  Take 1 tablet (2.5 mg total) by mouth daily.     levothyroxine 125 MCG tablet  Commonly known as:  SYNTHROID, LEVOTHROID  Take 125 mcg by mouth daily before breakfast.     lidocaine-prilocaine cream  Commonly known as:  EMLA  Apply 1 application topically as needed (for port access).     metoprolol succinate 25 MG 24 hr tablet  Commonly known as:  TOPROL XL  Take 3 tablets (75 mg total) by mouth daily. Take with or immediately following a meal.     multivitamin with minerals Tabs  Take 1 tablet by mouth daily.     omeprazole 20 MG capsule  Commonly known as:  PRILOSEC  Take 1 capsule (20 mg total) by mouth 2 (two) times daily.     ondansetron 4 MG disintegrating tablet  Commonly known as:  ZOFRAN ODT  Take 1 tablet (4 mg total) by mouth every 8 (eight) hours as needed for nausea.     oxyCODONE-acetaminophen 5-325 MG per tablet  Commonly known as:  PERCOCET/ROXICET  Take 1-2 tablets by  mouth every 4 (four) hours as needed for pain.     saccharomyces boulardii 250 MG capsule  Commonly known as:  FLORASTOR  Take 1 capsule (250 mg total) by mouth 2 (two) times daily.     sodium chloride 0.9 % injection  10-40 mLs by Intracatheter route as needed (flush).     sodium chloride 0.9 % SOLN 1,000 mL with potassium chloride 2 mEq/ml SOLN 10 mEq  Inject 75 mL/hr into the vein continuous.         Disposition and Follow-up: to SNF for Rehab; see MD 5/1 at 8:30 AM  Significant Diagnostic Studies:  X-ray Chest Pa And Lateral   08/03/2012  *RADIOLOGY REPORT*  Clinical Data: Weakness, CHF, history breast cancer  CHEST - 2 VIEW  Comparison: 07/12/2012  Findings: Stable left subclavian power port catheter with the tip in the lower SVC region.  Normal heart size and vascularity. Postop changes from right mastectomy and axillary lymph node dissection.  Negative for CHF, pneumonia, collapse,  consolidation, effusion or pneumothorax.  Trachea is midline.  Slightly lower lung volumes than the prior exam.  IMPRESSION: Stable postoperative findings.  No superimposed acute process   Original Report Authenticated By: Judie Petit. Miles Costain, M.D.    Abd 1 View (kub)  08/03/2012  *RADIOLOGY REPORT*  Clinical Data: Weakness, CHF, breast cancer, pain  ABDOMEN - 1 VIEW  Comparison: 07/05/2012  Findings: Visualized lung bases clear.  Nonobstructive bowel gas pattern.  Prior cholecystectomy evident.  No significant dilatation or ileus.  Degenerative changes of the lower lumbar spine and SI joints. Advance left hip degenerative arthritic changes with sclerosis and subchondral lucency of the femoral head and also sclerosis of the acetabulum.  No malalignment or definite fracture.  Pelvis appears intact.  Tubal ligation clips noted.  Pelvic calcifications presumably from degenerated fibroids.  IMPRESSION: Nonobstructive bowel gas pattern.  Lower lumbar spine, SI joint and hip degenerative changes, most pronounced involving the left hip as described.  No acute osseous finding.   Original Report Authenticated By: Judie Petit. Miles Costain, M.D.     Discharge Laboratory Values: Basic Metabolic Panel:  Recent Labs Lab 08/13/12 0555 08/14/12 0545 08/15/12 0600 08/16/12 0505 08/17/12 0520  NA 135 133* 135 132* 130*  K 3.4* 3.4* 3.2* 3.4* 3.1*  CL 109 107 109 106 104  CO2 16* 16* 17* 17* 17*  GLUCOSE 112* 92 75 77 80  BUN 16 14 12 11 10   CREATININE 0.83 0.77 0.75 0.73 0.71  CALCIUM 6.8* 7.1* 7.0* 7.4* 7.4*  MG 1.6 1.7 1.7 1.7 1.7  PHOS 3.1 3.0 3.2 3.0 3.0   GFR Estimated Creatinine Clearance: 88.1 ml/min (by C-G formula based on Cr of 0.71). Liver Function Tests:  Recent Labs Lab 08/13/12 0555 08/14/12 0545 08/15/12 0600 08/16/12 0505 08/17/12 0520  AST 27 37 41* 41* 43*  ALT 19 22 24 27  32  ALKPHOS 157* 154* 166* 184* 192*  BILITOT 0.5 0.5 0.5 0.5 0.6  PROT 4.3* 4.3* 4.4* 4.5* 4.7*  ALBUMIN 1.5* 1.5* 1.4* 1.5*  1.5*   No results found for this basename: LIPASE, AMYLASE,  in the last 168 hours No results found for this basename: AMMONIA,  in the last 168 hours Coagulation profile No results found for this basename: INR, PROTIME,  in the last 168 hours  CBC:  Recent Labs Lab 08/13/12 0555 08/14/12 0545 08/15/12 0600 08/16/12 0505 08/17/12 0520  WBC 6.2 5.2 4.6 4.6 5.5  NEUTROABS 5.1 4.1 3.6 3.4 4.4  HGB 12.8 12.6  12.5 12.6 13.2  HCT 37.8 37.8 37.3 37.8 38.7  MCV 79.4 79.7 80.6 80.1 80.0  PLT 68* 69* 74* 79* 100*   Cardiac Enzymes: No results found for this basename: CKTOTAL, CKMB, CKMBINDEX, TROPONINI,  in the last 168 hours BNP: No components found with this basename: POCBNP,  CBG:  Recent Labs Lab 08/15/12 1641 08/15/12 2124 08/16/12 0802 08/16/12 1226 08/16/12 1655  GLUCAP 86 98 103* 106* 98   D-Dimer No results found for this basename: DDIMER,  in the last 72 hours Hgb A1c No results found for this basename: HGBA1C,  in the last 72 hours Lipid Profile No results found for this basename: CHOL, HDL, LDLCALC, TRIG, CHOLHDL, LDLDIRECT,  in the last 72 hours Thyroid function studies No results found for this basename: TSH, T4TOTAL, FREET3, T3FREE, THYROIDAB,  in the last 72 hours Anemia work up No results found for this basename: VITAMINB12, FOLATE, FERRITIN, TIBC, IRON, RETICCTPCT,  in the last 72 hours Microbiology Recent Results (from the past 240 hour(s))  MRSA PCR SCREENING     Status: None   Collection Time    08/07/12  3:14 PM      Result Value Range Status   MRSA by PCR NEGATIVE  NEGATIVE Final   Comment:            The GeneXpert MRSA Assay (FDA     approved for NASAL specimens     only), is one component of a     comprehensive MRSA colonization     surveillance program. It is not     intended to diagnose MRSA     infection nor to guide or     monitor treatment for     MRSA infections.     Brief H and P: For complete details please refer to admission  H and P, but in brief, the patient was admitted after her second cycle of chemotherapy with fever and neutropenia, diarrhea, dehydration, and multiple electrolyte abnormalities. For further details see hospital course  Physical Exam at Discharge: BP 93/69  Pulse 117  Temp(Src) 97.5 F (36.4 C) (Oral)  Resp 18  Ht 5\' 4"  (1.626 m)  Wt 220 lb 4.8 oz (99.927 kg)  BMI 37.8 kg/m2  SpO2 97% Gen: middle aged white woman examined in bed Cardiovascular:irregular rhythm, no murmur Respiratory:no rales or rhonchi, no wheezes Gastrointestinal:soft, NT, +BS Extremities:1+ bilateral LE edema    Hospital Course:  Active Problems:   Cancer of breast   HYPOTHYROIDISM   Neutropenic fever   A-fib   Diarrhea   Hyperkalemia  56 y.o. [name] currently s/p cycle 2 chemotherapy, admitted with severe diarrhea, vomiting, dehydration, multiple electrolyte abnormalities, confusion and severe neutropenia with fever   (1) status post right mastectomy under the care of Dr. 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 Lymphovascular and appearing neural invasion was identified. 2 of 18 lymph nodes were involved. Margins were clear. Pathologic staging pT3, pN1a, stage IIIA. (Note the original clinical staging was IIB.)  (2) treated in the adjuvant setting with docetaxel/ cyclophosphamide. First treatment was given on 07/06/2012, very poorly tolerated. We decreased her doses by 15% with cycle 2, given 07/29/2012, but tolerance still very poor. No further chemotherapy planned  .  (3) patient will need postmastectomy radiation, which will be followed by antiestrogen therapy.  (4) comorbidities include diabetes, hypertension,  atrial fibrillation and chronic liver disease with cirrhosis and fatty liver.  (5) diabetes-- continue SSI until po intake normalized (6)  atrial fib.--controlled on cardizem and beta blocker--low BP has been an issue; cardizem dose was decreased 04/03; patient will follow-up with Dr Leodis Sias as outpatient (7) fever-neutropenia: s/p3 days primaxin/ vanco, s/p 4 days cipro (7 days ABX); neutrophils now recovered; urine grew klebsiella and e coli, both cipro sensitive; ABX d/c'd 4/3 with no evidence of infection at present (8) diarrhea: c diff negative; flexiseal discontinued 4/7;patient still has incontinence and will need to be prompted to get on BSC 3/day until stools normalize; continue to replace fluids and electrolytes; d/c'd octreotide; on questran;  (9) Full code.  (10) nutrition remains a limiting issue--regular diet w supplements (11) DVT prophylaxis: in place  (12) Deconditioning: severe;  to SNF for rehab  (13) d/c planning: to SNF until patient is able to get OOBTC and ambulate safely on her own (anticipate 2-4 weeks of rehab will be needed) (14) letrozole started 08/16/2012  (15) follow up: patient will meet w me and radiation oncology 5/1 at 8:30 AM   Diet:  regular  Activity:  OOBTC and to ambulate w assistance; daily PT OT  Condition at Discharge:   improved  Signed: Dr. Ruthann Cancer [phone]  08/17/2012, 8:09 AM

## 2012-08-17 NOTE — Progress Notes (Signed)
Report called to [facility] at [employer].  Philomena Doheny RN

## 2012-08-17 NOTE — Progress Notes (Signed)
Occupational Therapy Treatment Patient Details Name: [name] MRN: [id number] DOB: [birth year] Today's Date: 08/17/2012 Time: 4098-1191 OT Time Calculation (min): 19 min  OT Assessment / Plan / Recommendation Comments on Treatment Session pt with increased activity tolerance.  Dyspnea 2/4 with exertion.  Husband present and eager to assist in any way    Follow Up Recommendations  SNF    Barriers to Discharge       Equipment Recommendations       Recommendations for Other Services    Frequency     Plan      Precautions / Restrictions Precautions Precautions: Fall Restrictions Weight Bearing Restrictions: No   Pertinent Vitals/Pain No c/o pain.  BP 119/65    ADL  Toilet Transfer: Simulated;+2 Total assistance Toilet Transfer: Patient Percentage: 70% Toilet Transfer Method:  (bed, ambulated, chair) Equipment Used: Gait belt;Rolling walker Transfers/Ambulation Related to ADLs: pt ambulated 42' with 1 rest break--total A x 2 pt 70% for safety.  Simulated toilet transfer ADL Comments: all adls were done this am    OT Diagnosis:    OT Problem List:   OT Treatment Interventions:     OT Goals Acute Rehab OT Goals Time For Goal Achievement: 08/26/12 ADL Goals Pt Will Transfer to Toilet: with min assist;Stand pivot transfer;3-in-1 ADL Goal: Toilet Transfer - Progress: Progressing toward goals  Visit Information  Last OT Received On: 08/17/12 Assistance Needed: +2 PT/OT Co-Evaluation/Treatment: Yes    Subjective Data      Prior Functioning       Cognition  Cognition Overall Cognitive Status: Appears within functional limits for tasks assessed/performed Orientation Level: Time Behavior During Session: [facility] for tasks performed    Mobility  Bed Mobility Supine to Sit: 3: Mod assist;With rails;HOB elevated Sitting - Scoot to Edge of Bed: 3: Mod assist Details for Bed Mobility Assistance: verbal cues for technique; initial tactile cue to  initiate Transfers Sit to Stand: From elevated surface;3: Mod assist;With upper extremity assist Stand to Sit: 4: Min assist Details for Transfer Assistance: cues for use of UE's to stand from bed and recliner./ reach back to armrests.    Exercises      Balance Static Sitting Balance Static Sitting - Balance Support: No upper extremity supported Static Sitting - Level of Assistance: 5: Stand by assistance   End of Session    GO     Jorgina Binning 08/17/2012, 9:12 AM [name], OTR/L [phone] 08/17/2012

## 2012-08-17 NOTE — Progress Notes (Signed)
Clinical Social Worker spoke with pt and pt's husband regarding dc plans.  Pt is agreeable to rehab at [facility].  Physician, RN, pt, and SNF aware of dc plans to [facility] today.  CSW completed DC packet and arranged for non-emergent transportation.  No additional CSW needs at this time.  CSW signing off.  Tamala Julian, MSW, LCSW Clinical Social Worker [facility] [phone]

## 2012-08-17 NOTE — Progress Notes (Signed)
Physical Therapy Treatment Patient Details Name: [name] MRN: [id number] DOB: 10-05-1955 Today's Date: 08/17/2012 Time: 0811-0830 PT Time Calculation (min): 19 min  PT Assessment / Plan / Recommendation Comments on Treatment Session  Pt. waas able to bef=gin walking short distance today with 2 assist. Pt. plans to DC to SNF. continue PT.    Follow Up Recommendations  SNF     Does the patient have the potential to tolerate intense rehabilitation     Barriers to Discharge        Equipment Recommendations  Rolling walker with 5" wheels;Wheelchair (measurements PT)    Recommendations for Other Services    Frequency Min 3X/week   Plan Discharge plan needs to be updated;Frequency needs to be updated    Precautions / Restrictions Precautions Precautions: Fall   Pertinent Vitals/Pain No c/o dizziness. BP 119/63.    Mobility  Bed Mobility Supine to Sit: 3: Mod assist;With rails;HOB elevated Sitting - Scoot to Edge of Bed: 3: Mod assist Details for Bed Mobility Assistance: verbal cues for technique; initial tactile cue to initiate Transfers Sit to Stand: From elevated surface;3: Mod assist;With upper extremity assist Stand to Sit: 4: Min assist Details for Transfer Assistance: cues for use of UE's to stand from bed and recliner./ reach back to armrests. Ambulation/Gait Ambulation/Gait Assistance: 1: +2 Total assist Ambulation/Gait: Patient Percentage: 70% Ambulation Distance (Feet): 20 Feet (then 16 ft.) Assistive device: Rolling walker Ambulation/Gait Assistance Details: cues for posture, some guidance of RW to steer, Gait Pattern: Step-through pattern Gait velocity: slow.    Exercises     PT Diagnosis:    PT Problem List:   PT Treatment Interventions:     PT Goals Acute Rehab PT Goals Pt will go Supine/Side to Sit: with supervision PT Goal: Supine/Side to Sit - Progress: Progressing toward goal Pt will go Sit to Stand: with min assist PT Goal: Sit to Stand -  Progress: Progressing toward goal Pt will go Stand to Sit: with supervision PT Goal: Stand to Sit - Progress: Progressing toward goal Pt will Ambulate: 16 - 50 feet;with min assist;with rolling walker PT Goal: Ambulate - Progress: Progressing toward goal  Visit Information  Last PT Received On: 08/17/12 Assistance Needed: +2    Subjective Data  Subjective: That was tiring.   Cognition  Cognition Overall Cognitive Status: Appears within functional limits for tasks assessed/performed Orientation Level: Time    Balance  Static Sitting Balance Static Sitting - Balance Support: No upper extremity supported Static Sitting - Level of Assistance: 5: Stand by assistance  End of Session PT - End of Session Equipment Utilized During Treatment: Gait belt Activity Tolerance: Patient tolerated treatment well;Patient limited by fatigue Patient left: in chair;with call bell/phone within reach;with family/visitor present Nurse Communication: Mobility status   GP     Rada Hay 08/17/2012, 8:55 AM Blanchard Kelch PT [phone]

## 2012-08-18 ENCOUNTER — Ambulatory Visit: Payer: 59

## 2012-08-19 ENCOUNTER — Other Ambulatory Visit: Payer: Self-pay | Admitting: *Deleted

## 2012-08-19 ENCOUNTER — Other Ambulatory Visit: Payer: 59 | Admitting: Lab

## 2012-08-19 ENCOUNTER — Telehealth: Payer: Self-pay | Admitting: *Deleted

## 2012-08-19 ENCOUNTER — Ambulatory Visit: Payer: 59 | Admitting: Physician Assistant

## 2012-08-19 ENCOUNTER — Ambulatory Visit: Payer: 59

## 2012-08-19 NOTE — Telephone Encounter (Signed)
This RN spoke with pt's husband, Dorinda Hill, per his concern with pt's admission to SNF and desire for pt to be d/ced to home with home health services.  Per review with MD approval given for above.  This RN contacted admission coordinator and spoke with Magda Paganini, discussed above- she states she is aware of desire for d/c. Attending MD has handwritten all scripts for discharge. This office will contact [facility] for home health orders and DME needed for home care.  This RN spoke with husband per above.  [facility] contacted and orders faxed with request for services to begin 08/20/2012 and to contact this RN if unable to accommodate.

## 2012-08-20 ENCOUNTER — Other Ambulatory Visit: Payer: Self-pay | Admitting: *Deleted

## 2012-08-20 ENCOUNTER — Ambulatory Visit: Payer: 59

## 2012-08-24 ENCOUNTER — Other Ambulatory Visit: Payer: Self-pay | Admitting: *Deleted

## 2012-08-24 ENCOUNTER — Other Ambulatory Visit: Payer: 59 | Admitting: Lab

## 2012-08-24 ENCOUNTER — Emergency Department (HOSPITAL_COMMUNITY): Payer: 59

## 2012-08-24 ENCOUNTER — Ambulatory Visit: Payer: 59 | Admitting: Family

## 2012-08-24 ENCOUNTER — Telehealth: Payer: Self-pay | Admitting: *Deleted

## 2012-08-24 ENCOUNTER — Inpatient Hospital Stay (HOSPITAL_COMMUNITY)
Admission: EM | Admit: 2012-08-24 | Discharge: 2012-09-06 | DRG: 308 | Disposition: A | Discharge status: Home Health | Payer: 59 | Attending: Internal Medicine | Admitting: Internal Medicine

## 2012-08-24 ENCOUNTER — Ambulatory Visit: Payer: 59 | Admitting: Physician Assistant

## 2012-08-24 ENCOUNTER — Encounter (HOSPITAL_COMMUNITY): Payer: Self-pay | Admitting: Nurse Practitioner

## 2012-08-24 DIAGNOSIS — I10 Essential (primary) hypertension: Secondary | ICD-10-CM

## 2012-08-24 DIAGNOSIS — L03116 Cellulitis of left lower limb: Secondary | ICD-10-CM

## 2012-08-24 DIAGNOSIS — E785 Hyperlipidemia, unspecified: Secondary | ICD-10-CM | POA: Diagnosis present

## 2012-08-24 DIAGNOSIS — I4949 Other premature depolarization: Secondary | ICD-10-CM | POA: Diagnosis present

## 2012-08-24 DIAGNOSIS — F329 Major depressive disorder, single episode, unspecified: Secondary | ICD-10-CM | POA: Diagnosis present

## 2012-08-24 DIAGNOSIS — B961 Klebsiella pneumoniae [K. pneumoniae] as the cause of diseases classified elsewhere: Secondary | ICD-10-CM | POA: Diagnosis present

## 2012-08-24 DIAGNOSIS — K7460 Unspecified cirrhosis of liver: Secondary | ICD-10-CM

## 2012-08-24 DIAGNOSIS — G4733 Obstructive sleep apnea (adult) (pediatric): Secondary | ICD-10-CM | POA: Diagnosis present

## 2012-08-24 DIAGNOSIS — C50919 Malignant neoplasm of unspecified site of unspecified female breast: Secondary | ICD-10-CM

## 2012-08-24 DIAGNOSIS — Z86718 Personal history of other venous thrombosis and embolism: Secondary | ICD-10-CM

## 2012-08-24 DIAGNOSIS — I5033 Acute on chronic diastolic (congestive) heart failure: Secondary | ICD-10-CM | POA: Diagnosis present

## 2012-08-24 DIAGNOSIS — L02419 Cutaneous abscess of limb, unspecified: Secondary | ICD-10-CM | POA: Diagnosis present

## 2012-08-24 DIAGNOSIS — R791 Abnormal coagulation profile: Secondary | ICD-10-CM | POA: Diagnosis present

## 2012-08-24 DIAGNOSIS — E119 Type 2 diabetes mellitus without complications: Secondary | ICD-10-CM

## 2012-08-24 DIAGNOSIS — E876 Hypokalemia: Secondary | ICD-10-CM

## 2012-08-24 DIAGNOSIS — K219 Gastro-esophageal reflux disease without esophagitis: Secondary | ICD-10-CM | POA: Diagnosis present

## 2012-08-24 DIAGNOSIS — D509 Iron deficiency anemia, unspecified: Secondary | ICD-10-CM | POA: Diagnosis present

## 2012-08-24 DIAGNOSIS — E039 Hypothyroidism, unspecified: Secondary | ICD-10-CM | POA: Diagnosis present

## 2012-08-24 DIAGNOSIS — E669 Obesity, unspecified: Secondary | ICD-10-CM | POA: Diagnosis present

## 2012-08-24 DIAGNOSIS — Z87891 Personal history of nicotine dependence: Secondary | ICD-10-CM

## 2012-08-24 DIAGNOSIS — Z6835 Body mass index (BMI) 35.0-35.9, adult: Secondary | ICD-10-CM

## 2012-08-24 DIAGNOSIS — L03119 Cellulitis of unspecified part of limb: Secondary | ICD-10-CM | POA: Diagnosis present

## 2012-08-24 DIAGNOSIS — R609 Edema, unspecified: Secondary | ICD-10-CM

## 2012-08-24 DIAGNOSIS — I509 Heart failure, unspecified: Secondary | ICD-10-CM

## 2012-08-24 DIAGNOSIS — E43 Unspecified severe protein-calorie malnutrition: Secondary | ICD-10-CM

## 2012-08-24 DIAGNOSIS — F3289 Other specified depressive episodes: Secondary | ICD-10-CM | POA: Diagnosis present

## 2012-08-24 DIAGNOSIS — E41 Nutritional marasmus: Secondary | ICD-10-CM

## 2012-08-24 DIAGNOSIS — N390 Urinary tract infection, site not specified: Secondary | ICD-10-CM

## 2012-08-24 DIAGNOSIS — I9589 Other hypotension: Secondary | ICD-10-CM | POA: Diagnosis present

## 2012-08-24 DIAGNOSIS — I4891 Unspecified atrial fibrillation: Principal | ICD-10-CM

## 2012-08-24 DIAGNOSIS — C773 Secondary and unspecified malignant neoplasm of axilla and upper limb lymph nodes: Secondary | ICD-10-CM | POA: Diagnosis present

## 2012-08-24 DIAGNOSIS — R601 Generalized edema: Secondary | ICD-10-CM

## 2012-08-24 DIAGNOSIS — T502X5A Adverse effect of carbonic-anhydrase inhibitors, benzothiadiazides and other diuretics, initial encounter: Secondary | ICD-10-CM | POA: Diagnosis present

## 2012-08-24 DIAGNOSIS — T46905A Adverse effect of unspecified agents primarily affecting the cardiovascular system, initial encounter: Secondary | ICD-10-CM | POA: Diagnosis present

## 2012-08-24 DIAGNOSIS — N289 Disorder of kidney and ureter, unspecified: Secondary | ICD-10-CM

## 2012-08-24 LAB — POCT I-STAT TROPONIN I: Troponin i, poc: 0.01 ng/mL (ref 0.00–0.08)

## 2012-08-24 LAB — PROTIME-INR
INR: 2.44 — ABNORMAL HIGH (ref 0.00–1.49)
Prothrombin Time: 25.4 seconds — ABNORMAL HIGH (ref 11.6–15.2)

## 2012-08-24 LAB — CBC WITH DIFFERENTIAL/PLATELET
Basophils Absolute: 0 10*3/uL (ref 0.0–0.1)
Eosinophils Absolute: 0.1 10*3/uL (ref 0.0–0.7)
HCT: 39.2 % (ref 36.0–46.0)
Lymphocytes Relative: 14 % (ref 12–46)
MCHC: 33.4 g/dL (ref 30.0–36.0)
Neutro Abs: 6.2 10*3/uL (ref 1.7–7.7)
Platelets: 136 10*3/uL — ABNORMAL LOW (ref 150–400)
RDW: 24 % — ABNORMAL HIGH (ref 11.5–15.5)

## 2012-08-24 LAB — GLUCOSE, CAPILLARY
Glucose-Capillary: 94 mg/dL (ref 70–99)
Glucose-Capillary: 101 mg/dL — ABNORMAL HIGH (ref 70–99)

## 2012-08-24 LAB — URINE MICROSCOPIC-ADD ON

## 2012-08-24 LAB — URINALYSIS, ROUTINE W REFLEX MICROSCOPIC
Ketones, ur: NEGATIVE mg/dL
Nitrite: POSITIVE — AB
Protein, ur: 30 mg/dL — AB

## 2012-08-24 LAB — COMPREHENSIVE METABOLIC PANEL
Alkaline Phosphatase: 242 U/L — ABNORMAL HIGH (ref 39–117)
BUN: 10 mg/dL (ref 6–23)
CO2: 22 mEq/L (ref 19–32)
Chloride: 102 mEq/L (ref 96–112)
GFR calc Af Amer: >90 mL/min (ref >90)
GFR calc non Af Amer: >90 mL/min (ref >90)
Glucose, Bld: 83 mg/dL (ref 70–99)
Potassium: 3.6 mEq/L (ref 3.5–5.1)
Total Bilirubin: 0.6 mg/dL (ref 0.3–1.2)
Total Protein: 5.5 g/dL — ABNORMAL LOW (ref 6.0–8.3)

## 2012-08-24 LAB — PRO B NATRIURETIC PEPTIDE: Pro B Natriuretic peptide (BNP): 1816 pg/mL — ABNORMAL HIGH (ref 0–125)

## 2012-08-24 LAB — MRSA PCR SCREENING: MRSA by PCR: NEGATIVE

## 2012-08-24 MED ORDER — CEFTRIAXONE SODIUM 1 G IJ SOLR
1.0000 g | Freq: Once | INTRAMUSCULAR | Status: AC
  Administered 2012-08-24: 1 g via INTRAVENOUS
  Filled 2012-08-24: qty 10

## 2012-08-24 MED ORDER — ACETAMINOPHEN 325 MG PO TABS
650.0000 mg | ORAL_TABLET | Freq: Four times a day (QID) | ORAL | Status: DC | PRN
  Administered 2012-08-28 – 2012-08-29 (×2): 650 mg via ORAL
  Administered 2012-08-30: 325 mg via ORAL
  Administered 2012-08-30 – 2012-09-03 (×3): 650 mg via ORAL
  Filled 2012-08-24 (×4): qty 2
  Filled 2012-08-24: qty 1
  Filled 2012-08-24: qty 2

## 2012-08-24 MED ORDER — DILTIAZEM HCL 100 MG IV SOLR
5.0000 mg/h | Freq: Once | INTRAVENOUS | Status: AC
  Administered 2012-08-24: 5 mg/h via INTRAVENOUS
  Filled 2012-08-24: qty 100

## 2012-08-24 MED ORDER — ENOXAPARIN SODIUM 40 MG/0.4ML ~~LOC~~ SOLN: 40.0000 mg | Freq: Every day | SUBCUTANEOUS | Status: DC

## 2012-08-24 MED ORDER — CHOLESTYRAMINE 4 G PO PACK
4.0000 g | PACK | Freq: Two times a day (BID) | ORAL | Status: DC
  Administered 2012-08-24 – 2012-09-06 (×26): 4 g via ORAL
  Filled 2012-08-24 (×27): qty 1

## 2012-08-24 MED ORDER — ALUM & MAG HYDROXIDE-SIMETH 200-200-20 MG/5ML PO SUSP
30.0000 mL | Freq: Four times a day (QID) | ORAL | Status: DC | PRN
  Administered 2012-09-05: 30 mL via ORAL
  Filled 2012-08-24: qty 30

## 2012-08-24 MED ORDER — ACETAMINOPHEN 650 MG RE SUPP: 650.0000 mg | Freq: Four times a day (QID) | RECTAL | Status: DC | PRN

## 2012-08-24 MED ORDER — ONDANSETRON HCL 4 MG/2ML IJ SOLN
4.0000 mg | Freq: Four times a day (QID) | INTRAMUSCULAR | Status: DC | PRN
  Administered 2012-09-05: 4 mg via INTRAVENOUS
  Filled 2012-08-24 (×2): qty 2

## 2012-08-24 MED ORDER — MEGESTROL ACETATE 400 MG/10ML PO SUSP
400.0000 mg | Freq: Two times a day (BID) | ORAL | Status: DC
  Administered 2012-08-24 – 2012-09-06 (×26): 400 mg via ORAL
  Filled 2012-08-24 (×27): qty 10

## 2012-08-24 MED ORDER — MAGNESIUM CITRATE PO SOLN: 1.0000 | Freq: Once | ORAL | Status: AC | PRN

## 2012-08-24 MED ORDER — SORBITOL 70 % SOLN
30.0000 mL | Freq: Every day | Status: DC | PRN
  Filled 2012-08-24: qty 30

## 2012-08-24 MED ORDER — METOPROLOL TARTRATE 1 MG/ML IV SOLN: 5.0000 mg | Freq: Once | INTRAVENOUS | Status: DC

## 2012-08-24 MED ORDER — FUROSEMIDE 10 MG/ML IJ SOLN
INTRAMUSCULAR | Status: AC
  Filled 2012-08-24: qty 8

## 2012-08-24 MED ORDER — SODIUM CHLORIDE 0.9 % IJ SOLN
3.0000 mL | Freq: Two times a day (BID) | INTRAMUSCULAR | Status: DC
  Administered 2012-08-26 – 2012-09-05 (×7): 3 mL via INTRAVENOUS

## 2012-08-24 MED ORDER — ONDANSETRON 4 MG PO TBDP
4.0000 mg | ORAL_TABLET | Freq: Three times a day (TID) | ORAL | Status: DC | PRN
  Filled 2012-08-24: qty 1

## 2012-08-24 MED ORDER — METOPROLOL TARTRATE 1 MG/ML IV SOLN
5.0000 mg | Freq: Once | INTRAVENOUS | Status: AC
  Administered 2012-08-24: 5 mg via INTRAVENOUS

## 2012-08-24 MED ORDER — BUPROPION HCL ER (SR) 150 MG PO TB12
150.0000 mg | ORAL_TABLET | Freq: Every morning | ORAL | Status: DC
  Administered 2012-08-25 – 2012-09-06 (×13): 150 mg via ORAL
  Filled 2012-08-24 (×13): qty 1

## 2012-08-24 MED ORDER — DILTIAZEM HCL 100 MG IV SOLR
5.0000 mg/h | Freq: Once | INTRAVENOUS | Status: DC
  Filled 2012-08-24: qty 100

## 2012-08-24 MED ORDER — SODIUM CHLORIDE 0.9 % IJ SOLN: 3.0000 mL | INTRAMUSCULAR | Status: DC | PRN

## 2012-08-24 MED ORDER — METOPROLOL TARTRATE 1 MG/ML IV SOLN
5.0000 mg | Freq: Once | INTRAVENOUS | Status: DC
  Filled 2012-08-24: qty 15
  Filled 2012-08-24: qty 5

## 2012-08-24 MED ORDER — OXYCODONE-ACETAMINOPHEN 5-325 MG PO TABS
1.0000 | ORAL_TABLET | ORAL | Status: DC | PRN
  Administered 2012-08-25 (×2): 1 via ORAL
  Administered 2012-08-26: 2 via ORAL
  Administered 2012-08-28 – 2012-08-29 (×4): 1 via ORAL
  Administered 2012-08-29 – 2012-09-01 (×4): 2 via ORAL
  Administered 2012-09-02: 1 via ORAL
  Administered 2012-09-03 – 2012-09-04 (×2): 2 via ORAL
  Filled 2012-08-24 (×2): qty 2
  Filled 2012-08-24: qty 1
  Filled 2012-08-24: qty 2
  Filled 2012-08-24 (×3): qty 1
  Filled 2012-08-24 (×3): qty 2
  Filled 2012-08-24: qty 1
  Filled 2012-08-24 (×3): qty 2

## 2012-08-24 MED ORDER — DEXTROSE 5 % IV SOLN
5.0000 mg/h | INTRAVENOUS | Status: DC
  Administered 2012-08-24: 15 mg/h via INTRAVENOUS
  Administered 2012-08-25: 10 mg/h via INTRAVENOUS
  Administered 2012-08-25: 7.5 mg/h via INTRAVENOUS
  Administered 2012-08-26 (×2): 10 mg/h via INTRAVENOUS

## 2012-08-24 MED ORDER — APIXABAN 5 MG PO TABS
5.0000 mg | ORAL_TABLET | Freq: Two times a day (BID) | ORAL | Status: DC
  Administered 2012-08-24: 5 mg via ORAL
  Filled 2012-08-24 (×3): qty 1

## 2012-08-24 MED ORDER — AMIODARONE IV BOLUS ONLY 150 MG/100ML: 150.0000 mg | Freq: Once | INTRAVENOUS | Status: DC

## 2012-08-24 MED ORDER — INSULIN ASPART 100 UNIT/ML ~~LOC~~ SOLN
0.0000 [IU] | Freq: Three times a day (TID) | SUBCUTANEOUS | Status: DC
  Administered 2012-08-26 – 2012-08-29 (×2): 2 [IU] via SUBCUTANEOUS
  Administered 2012-08-30: 3 [IU] via SUBCUTANEOUS
  Administered 2012-08-30 – 2012-09-06 (×11): 2 [IU] via SUBCUTANEOUS

## 2012-08-24 MED ORDER — ENOXAPARIN SODIUM 40 MG/0.4ML ~~LOC~~ SOLN
40.0000 mg | Freq: Every day | SUBCUTANEOUS | Status: DC
  Filled 2012-08-24: qty 0.4

## 2012-08-24 MED ORDER — ASPIRIN EC 81 MG PO TBEC
81.0000 mg | DELAYED_RELEASE_TABLET | Freq: Every morning | ORAL | Status: DC
  Administered 2012-08-25 – 2012-08-31 (×7): 81 mg via ORAL
  Filled 2012-08-24 (×8): qty 1

## 2012-08-24 MED ORDER — FUROSEMIDE 10 MG/ML IJ SOLN
60.0000 mg | Freq: Three times a day (TID) | INTRAMUSCULAR | Status: DC
  Administered 2012-08-24 – 2012-08-25 (×2): 60 mg via INTRAVENOUS
  Filled 2012-08-24 (×3): qty 6

## 2012-08-24 MED ORDER — AMIODARONE HCL 200 MG PO TABS
400.0000 mg | ORAL_TABLET | Freq: Two times a day (BID) | ORAL | Status: DC
  Administered 2012-08-24 – 2012-09-06 (×26): 400 mg via ORAL
  Filled 2012-08-24 (×28): qty 2

## 2012-08-24 MED ORDER — SODIUM CHLORIDE 0.9 % IJ SOLN
3.0000 mL | Freq: Two times a day (BID) | INTRAMUSCULAR | Status: DC
  Administered 2012-08-25 – 2012-08-27 (×5): 3 mL via INTRAVENOUS
  Administered 2012-08-28: 23:00:00 via INTRAVENOUS
  Administered 2012-08-28 – 2012-09-03 (×6): 3 mL via INTRAVENOUS

## 2012-08-24 MED ORDER — SACCHAROMYCES BOULARDII 250 MG PO CAPS
250.0000 mg | ORAL_CAPSULE | Freq: Two times a day (BID) | ORAL | Status: DC
  Administered 2012-08-24 – 2012-09-06 (×27): 250 mg via ORAL
  Filled 2012-08-24 (×28): qty 1

## 2012-08-24 MED ORDER — FUROSEMIDE 10 MG/ML IJ SOLN
40.0000 mg | Freq: Once | INTRAMUSCULAR | Status: AC
  Administered 2012-08-24: 40 mg via INTRAVENOUS
  Filled 2012-08-24 (×2): qty 4

## 2012-08-24 MED ORDER — DEXTROSE 5 % IV SOLN
1.0000 g | INTRAVENOUS | Status: AC
  Administered 2012-08-25 – 2012-08-30 (×6): 1 g via INTRAVENOUS
  Filled 2012-08-24 (×6): qty 10

## 2012-08-24 MED ORDER — LEVOTHYROXINE SODIUM 125 MCG PO TABS
125.0000 ug | ORAL_TABLET | Freq: Every day | ORAL | Status: DC
  Administered 2012-08-25 – 2012-09-06 (×13): 125 ug via ORAL
  Filled 2012-08-24 (×17): qty 1

## 2012-08-24 MED ORDER — POLYETHYLENE GLYCOL 3350 17 G PO PACK
17.0000 g | PACK | Freq: Every day | ORAL | Status: DC | PRN
  Filled 2012-08-24: qty 1

## 2012-08-24 MED ORDER — LIDOCAINE-PRILOCAINE 2.5-2.5 % EX CREA
1.0000 "application " | TOPICAL_CREAM | CUTANEOUS | Status: DC | PRN
  Filled 2012-08-24: qty 5

## 2012-08-24 MED ORDER — DILTIAZEM HCL 25 MG/5ML IV SOLN: 15.0000 mg | Freq: Once | INTRAVENOUS | Status: DC

## 2012-08-24 MED ORDER — SODIUM CHLORIDE 0.9 % IV SOLN
250.0000 mL | INTRAVENOUS | Status: DC | PRN
  Administered 2012-09-05: 250 mL via INTRAVENOUS

## 2012-08-24 MED ORDER — ATORVASTATIN CALCIUM 10 MG PO TABS
10.0000 mg | ORAL_TABLET | Freq: Every day | ORAL | Status: DC
  Administered 2012-08-24 – 2012-09-05 (×13): 10 mg via ORAL
  Filled 2012-08-24 (×16): qty 1

## 2012-08-24 MED ORDER — PANTOPRAZOLE SODIUM 40 MG PO TBEC
40.0000 mg | DELAYED_RELEASE_TABLET | Freq: Every day | ORAL | Status: DC
  Administered 2012-08-25 – 2012-08-28 (×3): 40 mg via ORAL
  Filled 2012-08-24 (×6): qty 1

## 2012-08-24 MED ORDER — DILTIAZEM LOAD VIA INFUSION
10.0000 mg | Freq: Once | INTRAVENOUS | Status: AC
  Administered 2012-08-24: 20 mg via INTRAVENOUS
  Filled 2012-08-24: qty 20

## 2012-08-24 MED ORDER — BOOST / RESOURCE BREEZE PO LIQD
1.0000 | Freq: Two times a day (BID) | ORAL | Status: DC
  Administered 2012-08-31: 1 via ORAL
  Filled 2012-08-24: qty 1

## 2012-08-24 MED ORDER — ONDANSETRON HCL 4 MG PO TABS
4.0000 mg | ORAL_TABLET | Freq: Four times a day (QID) | ORAL | Status: DC | PRN
  Administered 2012-09-05: 4 mg via ORAL
  Filled 2012-08-24: qty 2
  Filled 2012-08-24: qty 1

## 2012-08-24 MED ORDER — LETROZOLE 2.5 MG PO TABS
2.5000 mg | ORAL_TABLET | Freq: Every day | ORAL | Status: DC
  Administered 2012-08-24 – 2012-09-06 (×14): 2.5 mg via ORAL
  Filled 2012-08-24 (×17): qty 1

## 2012-08-24 MED ORDER — DOCUSATE SODIUM 100 MG PO CAPS
100.0000 mg | ORAL_CAPSULE | Freq: Two times a day (BID) | ORAL | Status: DC
  Administered 2012-08-24 – 2012-08-27 (×7): 100 mg via ORAL
  Filled 2012-08-24 (×11): qty 1

## 2012-08-24 MED ORDER — BIOTENE DRY MOUTH MT LIQD
15.0000 mL | Freq: Two times a day (BID) | OROMUCOSAL | Status: DC
  Administered 2012-08-28 – 2012-09-06 (×10): 15 mL via OROMUCOSAL

## 2012-08-24 MED ORDER — ADULT MULTIVITAMIN W/MINERALS CH
1.0000 | ORAL_TABLET | Freq: Every day | ORAL | Status: DC
  Administered 2012-08-24 – 2012-09-06 (×14): 1 via ORAL
  Filled 2012-08-24 (×14): qty 1

## 2012-08-24 MED ORDER — DILTIAZEM LOAD VIA INFUSION
15.0000 mg | Freq: Once | INTRAVENOUS | Status: DC
  Filled 2012-08-24: qty 15

## 2012-08-24 MED FILL — Medication: Qty: 1 | Status: AC

## 2012-08-24 NOTE — ED Notes (Addendum)
MD at bedside- verbal order to increase Cardizem to 15mg .

## 2012-08-24 NOTE — ED Notes (Signed)
Per PTAR:  Home health RN called EMS and c/o leg and hand edema.  RN stated that MD discontinued Lasix in Jan 2014 and the swelling has become progressively worse.  Pt was slightly SOB upon standing.  90% RA; [place] 2L-->94% but could not tolerated the [place].  Sats decreased to 91% on RA.  Hx: CHF (per home health RN), A-fib, breast cx (rt mastectomy).  Unclear per PTAR as to what medications were given today.  Family member at bedside.

## 2012-08-24 NOTE — ED Notes (Signed)
BP, HR and heart rhythm reported to Dr. Juleen China.

## 2012-08-24 NOTE — H&P (Signed)
[facility] History and Physical  [name] XBJ:[id number] DOB: 1955/11/11 DOA: 08/24/2012  Referring physician: Dr Juleen China PCP: [name]., NP  Specialists: Cardiology: Dr Elease Hashimoto Oncology: Dr [name]  Chief Complaint: LE swelling  HPI: [name] is a 57 y.o. 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 Patient is presenting to the ED with worsening lower extremity edema. Patient was discharged from the hospital to a skilled nursing facility however however after 2-3 days at the facility patient and family were not happy with the services received at the skilled nursing facility stating that patient was left in the wheelchair all day and also patient's medications were not being given toward the way they were supposed to and a such patient went home with home health services. Home health nurse today per family daughter that patient had worsening lower extremity edema she stated that the patient sounded congested on exam and subsequently patient was sent to the emergency room. Patient denies any fever, no chills, no nausea, no vomiting, no chest pain, no shortness of breath, no abdominal pain, no diarrhea, no constipation, no dysuria, no generalized weakness. Patient does endorse a nonproductive cough. No other associated symptoms. Patient was seen in the emergency room was noted to have a heart rate in the 150s and subsequently placed on a Cardizem drip after being given a dose of IV metoprolol. Patient also noted on exam  to have lower extremity edema per ED physician and 40 of Lasix were given. Chest x-ray which was done did show some infiltrates concerning for atelectasis versus infiltrate. CBC done had a normal white count. Comprehensive metabolic profile did have a sodium of 134 elevated alk phosphatase of 242 albumin of 1.6 AST of 50 ALT of 41 protein of 5.5 otherwise was within normal limits. EKG showed atrial fibrillation with RVR. Urinalysis done was nitrite positive leukocytes large WBCs with too numerous to count. Will call to admit the patient for further evaluation and management.  Review of Systems: The patient denies anorexia, fever, weight loss,, vision loss, decreased hearing, hoarseness, chest pain, syncope, dyspnea on exertion, peripheral edema, balance deficits, hemoptysis, abdominal pain, melena, hematochezia, severe indigestion/heartburn, hematuria, incontinence, genital sores, muscle weakness, suspicious skin lesions, transient blindness, difficulty walking, depression, unusual weight change, abnormal bleeding, enlarged lymph nodes, angioedema, and breast masses.    Past Medical History  Diagnosis Date  . Depression   . DVT (deep venous thrombosis)     hx of on HRT left leg ~2006  . GERD (gastroesophageal reflux disease)   . Hyperlipidemia   . Hypertension   . Hypothyroidism   . PPD positive, treated     rx inh   . OSA on CPAP   . Diabetes mellitus   . 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 Breast cancer     a. 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 Tubal ligation    . Cholecystectomy    . Foot surgery    . Eye surgery    .  Cataract extraction    . Percutaneous liver biopsy    . Breast biopsy      left breast  . Mastectomy modified radical  06/08/2012    Procedure: MASTECTOMY MODIFIED RADICAL;  Surgeon:  Mariella Saa, MD;  Location: [facility] OR;  Service: General;  Laterality: Right;  . Portacath placement  06/08/2012    Procedure: INSERTION PORT-A-CATH;  Surgeon: Mariella Saa, MD;  Location: [facility] OR;  Service: General;  Laterality: Left;   Social History:  reports that she has quit smoking. She has never used smokeless tobacco. She reports that she does not drink alcohol or use illicit drugs.  Allergies  Allergen Reactions  . Olmesartan Medoxomil Cough    REACTION: ? if cough  . Tetracycline Hcl     Unknown reaction, too long for patient to remember   . Venlafaxine     REACTION: severe dry moouth  . Adhesive (Tape) Rash    Family History  Problem Relation Age of Onset  . Diabetes Mother   . Hypertension Mother   . Arthritis Mother   . Heart disease Mother   . Heart failure Mother   . Other Mother     benign breast mass  . Stroke Father   . Heart disease Father   . Diabetes Paternal Grandmother   . Colon cancer Paternal Grandfather     Prior to Admission medications   Medication Sig Start Date End Date Taking? Authorizing Provider  antiseptic oral rinse (BIOTENE) LIQD 15 mLs by Mouth Rinse route 2 (two) times daily as needed. 08/17/12  Yes Lowella Dell, MD  aspirin EC 81 MG tablet Take 81 mg by mouth every morning.   Yes Historical Provider, MD  atorvastatin (LIPITOR) 10 MG tablet Take 10 mg by mouth at bedtime.  10/14/11  Yes Madelin Headings, MD  buPROPion (WELLBUTRIN SR) 150 MG 12 hr tablet Take 150 mg by mouth every morning.    Yes Historical Provider, MD  cholestyramine Lanetta Inch) 4 G packet Take 1 packet (4 g total) by mouth 2 (two) times daily. 08/17/12  Yes Lowella Dell, MD  diltiazem (CARDIZEM CD) 120 MG 24 hr capsule Take 120 mg by mouth every morning. 07/21/12  Yes Leroy Sea, MD  enoxaparin (LOVENOX) 40 MG/0.4ML injection Inject 0.4 mLs (40 mg total) into the skin daily. 08/17/12  Yes Lowella Dell, MD  feeding supplement (RESOURCE BREEZE) LIQD Take  1 Container by mouth 2 (two) times daily between meals. 08/17/12  Yes Lowella Dell, MD  insulin aspart (NOVOLOG) 100 UNIT/ML injection Inject 0-15 Units into the skin 3 (three) times daily with meals. 08/17/12  Yes Lowella Dell, MD  letrozole [facility]) 2.5 MG tablet Take 1 tablet (2.5 mg total) by mouth daily. 08/17/12  Yes Lowella Dell, MD  levothyroxine (SYNTHROID, LEVOTHROID) 125 MCG tablet Take 125 mcg by mouth daily before breakfast. 03/31/12  Yes Madelin Headings, MD  lidocaine-prilocaine (EMLA) cream Apply 1 application topically as needed (for port access). 07/01/12  Yes Amy Allegra Grana, PA-C  metoprolol succinate (TOPROL-XL) 25 MG 24 hr tablet Take 25 mg by mouth daily. Take with or immediately following a meal. 07/21/12  Yes Leroy Sea, MD  Multiple Vitamin (MULTIVITAMIN WITH MINERALS) TABS Take 1 tablet by mouth daily. 08/17/12  Yes Lowella Dell, MD  omeprazole (PRILOSEC) 20 MG capsule Take 1 capsule (20 mg total) by mouth 2 (two) times daily. 11/18/11  Yes Madelin Headings, MD  ondansetron (ZOFRAN ODT) 4 MG disintegrating tablet Take 1 tablet (4 mg total) by mouth every 8 (eight) hours as needed for nausea. 08/01/12  Yes Gerhard Munch, MD  oxyCODONE-acetaminophen (PERCOCET/ROXICET) 5-325 MG per tablet Take 1-2 tablets by mouth every 4 (four) hours as needed for pain. 08/02/12  Yes Raeford Razor, MD  saccharomyces boulardii (FLORASTOR) 250 MG capsule Take 1 capsule (250 mg total) by mouth 2 (two) times daily. 07/21/12  Yes Leroy Sea, MD  sodium chloride 0.9 % injection 10-40 mLs by Intracatheter route as needed (flush). 08/17/12  Yes Lowella Dell, MD  sodium chloride 0.9 % SOLN 1,000 mL with potassium chloride 2 mEq/ml SOLN 10 mEq Inject 75 mL/hr into the vein continuous. 08/17/12  Yes Lowella Dell, MD   Physical Exam: Filed Vitals:   08/24/12 1310 08/24/12 1315 08/24/12 1351 08/24/12 1450  BP: 100/75  101/61 101/75  Pulse:  58    Temp:      TempSrc:      Resp:  23 24  23   SpO2:  93% 93% 94%     General:  Obese, well-developed speaking in full sentences in no acute cardiopulmonary distress. Anasarca  Eyes: Pupils equal round reactive to light and accommodation. Extraocular movements intact.  ENT: Oropharynx is clear, no lesions, no exudates. Dry mucous membranes.  Neck: Supple with no lymphadenopathy. Positive JVD.  Cardiovascular: Irregularly irregular. + JVD  Respiratory: Scattered coarse breath sounds diffusely.  Abdomen: Soft, nontender, nondistended, positive bowel sounds.  Skin: Area of left erythema in the left medial ankle.  Musculoskeletal: 3-4/5 BUE strength. 3-4/5BLE  Psychiatric: depressed mood. Fair insight, fair judgement.  Extremities: 4+  BLE  Edema up to hips. Bilateral upper extremity with 2+ edema.  Neurologic: Alert and oriented x3. Cranial nerves II through XII are grossly intact. No focal deficits.  Labs on Admission:  Basic Metabolic Panel:  Recent Labs Lab 08/24/12 1225  NA 134*  K 3.6  CL 102  CO2 22  GLUCOSE 83  BUN 10  CREATININE 0.72  CALCIUM 7.5*   Liver Function Tests:  Recent Labs Lab 08/24/12 1225  AST 50*  ALT 41*  ALKPHOS 242*  BILITOT 0.6  PROT 5.5*  ALBUMIN 1.6*   No results found for this basename: LIPASE, AMYLASE,  in the last 168 hours No results found for this basename: AMMONIA,  in the last 168 hours CBC:  Recent Labs Lab 08/24/12 1225  WBC 8.4  NEUTROABS 6.2  HGB 13.1  HCT 39.2  MCV 81.0  PLT 136*   Cardiac Enzymes: No results found for this basename: CKTOTAL, CKMB, CKMBINDEX, TROPONINI,  in the last 168 hours  BNP (last 3 results)  Recent Labs  08/24/12 1225  PROBNP 1816.0*   CBG: No results found for this basename: GLUCAP,  in the last 168 hours  Radiological Exams on Admission: Dg Chest 2 View  08/24/2012  *RADIOLOGY REPORT*  Clinical Data: Shortness of breath, history of breast cancer  CHEST - 2 VIEW  Comparison: 08/03/2012  Findings: Cardiomediastinal  silhouette is stable.  Left subclavian Port-A-Cath is unchanged in position.  There is small right pleural effusion with right basilar atelectasis or infiltrate.  Trace left basilar atelectasis or infiltrate.  Nodular density left base measures about 1 cm.  This may represent nipple shadow.  Repeat frontal view of the chest with nipple markers is recommended.  The patient is status post right mastectomy.  IMPRESSION: Status post right mastectomy. Small right pleural effusion with right  basilar atelectasis or infiltrate. Trace left basilar atelectasis or infiltrate.  Nodular density left base may represent nipple shadow.  Repeat frontal view of the chest with nipple markers is recommended.   Original Report Authenticated By: Natasha Mead, M.D.     EKG: Independently reviewed. A. fib with RVR  Assessment/Plan Principal Problem:   A-fib Active Problems:   HYPOTHYROIDISM   DIABETES MELLITUS, TYPE II   Unspecified vitamin D deficiency   HYPERLIPIDEMIA   Morbid obesity   DEPRESSION   HYPERTENSION   GERD   DVT, HX OF   Liver disease, chronic, with cirrhosis   Cancer of breast   Microcytic anemia   Obesity, unspecified   Severe malnutrition in the context of acute illness   Anasarca   UTI (lower urinary tract infection)  #1 A. fib with RVR Questionable etiology. Patient denies any chest pain. Patient denies any shortness of breath. May be secondary to acute infection of UTI. Will cycle cardiac enzymes every 6 hours x3. Check a TSH. Check a 2-D echo. Will increase current Cardizem drip to 15 mics per hour. Will give a dose of metoprolol 5 mg IV x1. ED physician stated he consulted cardiology for further evaluation and management. Follow.  #2 anasarca/volume overload Likely secondary to malnutrition/hypoalbuminemia vs CHF vs secondary to chronic liver disease. Patient's albumin is 1.6. Patient also noted to have a history of liver disease/cirrhosis which would also be contributing to this. He also  be secondary to a CHF exacerbation. Patients edema is symmetric and bilateral in both lower extremities and upper extremities as well. Lower extremities with significant edema 4+ up to the hips. Will cycle cardiac enzymes every 6 hours x3. Check a TSH. Check a 2-D echo. Will place on Lasix 60 mg IV Q 8 hours. Strict I.'s and O.'s. Daily weights. Continue Ensure. Cardiology consultation is pending.  #3 urinary tract infection Change Foley catheter. Urine cultures are pending. Placed empirically on IV Rocephin.  #4 gastroesophageal reflux disease PPI.  #5 type 2 diabetes Check a hemoglobin A1c. Place on sliding scale insulin.  #6 hyperlipidemia Continue statin.  #7 severe malnutrition And show. Nutrition consult.  #8 history of breast cancer status post right mastectomy Will inform oncology of patient's admission.  #9 Severe Malnuitrition Nuitrition consult. Continue ensure. Start megace.  #10 prophylaxis PPI for GI prophylaxis. Lovenox for DVT prophylaxis.  Code Status: Full Family Communication: updated patient and husband at bedside. Disposition Plan: Admit to SDU  Time spent: 65 mins  [facility] Pager [phone]  If 7PM-7AM, please contact night-coverage [email or web address] Password [email or web address] 08/24/2012, 3:20 PM

## 2012-08-24 NOTE — ED Notes (Signed)
Cardizem increased to 10mg  by Seward Grater, RN per Dr. Janee Morn.

## 2012-08-24 NOTE — Telephone Encounter (Signed)
BLE edema pitting up to thighs with bilateral swelling in hands and arms. Lungs congested w/moist, nonproductive cough. Sats 94% at room air. No fever. Pulse is 130 and irregular and respirations are 20. BP up at 130/93. Assessment at home on 4/11 documented lungs clear. Husband reports a "near fall" today due to weakness and unsteady gait. Per Dr. [name], patient needs to be transported to emergency department for evaluation and possible readmission.

## 2012-08-24 NOTE — ED Notes (Addendum)
Patient requested port be accessed for blood draw.RN Victorino Dike made aware

## 2012-08-24 NOTE — Progress Notes (Signed)
CARDIOLOGY CONSULT NOTE  Patient ID: [name], MRN: [id number], DOB/AGE: Mar 09, 1956 57 y.o. Admit date: 08/24/2012 Date of Consult: 08/24/2012  Primary Physician: [name]., NP Primary Cardiologist: Nahser   Chief Complaint: Afib   HPI [name] is a 57 y.o. female  Admitted last month  with nausea vomiting and dehydration and found to be in atrial fibrillation. She found to be in atrial fibrillation  In  3/14. There has been no interval documentation of sinus rhythm. Electrocardiogram  over the last month and had atrial fibrillation rates as fast as 140. Echocardiogram last month demonstrated normal left ventricular function with mild left atrial dilatation    She was noted by her home health nurse today to have a rapid rate and was referred to the emergency room. She then discharged last week to a nursing home and had been seen by home health on Friday. Again no interval data is noted.  She's been on prophylactic DVT Lovenox.  Efforts to control her heart rate is in diltiazem and beta blockers were adversely affected by low blood pressure prompting decreasing of the aforementioned drugs  She has profound peripheral edema. Albumin today was 1.6 she has had severe anorexia.   Past Medical History  Diagnosis Date  . Depression   . DVT (deep venous thrombosis)     hx of on HRT left leg ~2006  . GERD (gastroesophageal reflux disease)   . Hyperlipidemia   . Hypertension   . Hypothyroidism   . PPD positive, treated     rx inh   . OSA on CPAP   . Diabetes mellitus   . 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 Breast cancer     a. 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 Tubal ligation    . Cholecystectomy    . Foot surgery    . Eye surgery    . Cataract  extraction    . Percutaneous liver biopsy    . Breast biopsy      left breast  . Mastectomy modified radical  06/08/2012    Procedure: MASTECTOMY MODIFIED RADICAL;  Surgeon: Mariella Saa, MD;  Location: [facility] OR;  Service: General;  Laterality: Right;  . Portacath placement  06/08/2012    Procedure: INSERTION PORT-A-CATH;  Surgeon: Mariella Saa, MD;  Location: [facility] OR;  Service: General;  Laterality: Left;     Home Meds: Prior to Admission medications   Medication Sig Start Date End Date Taking? Authorizing Provider  antiseptic oral rinse (BIOTENE) LIQD 15 mLs by Mouth Rinse route 2 (two) times daily as needed. 08/17/12  Yes Lowella Dell, MD  aspirin EC 81 MG tablet Take 81 mg by mouth every morning.   Yes Historical Provider, MD  atorvastatin (LIPITOR) 10 MG tablet Take 10 mg by mouth at bedtime.  10/14/11  Yes Madelin Headings, MD  buPROPion (WELLBUTRIN SR) 150 MG 12 hr tablet Take 150 mg by mouth every morning.    Yes Historical Provider, MD  cholestyramine Lanetta Inch) 4 G packet Take 1 packet (4 g total) by mouth 2 (two) times daily. 08/17/12  Yes Lowella Dell, MD  diltiazem (CARDIZEM CD) 120 MG 24 hr capsule Take 120 mg by mouth every morning. 07/21/12  Yes Leroy Sea, MD  enoxaparin (LOVENOX) 40 MG/0.4ML injection Inject 0.4 mLs (40 mg total) into the skin daily. 08/17/12  Yes Lowella Dell, MD  feeding supplement (RESOURCE BREEZE) LIQD Take 1 Container by mouth 2 (two) times daily between meals. 08/17/12  Yes Lowella Dell, MD  insulin aspart (NOVOLOG) 100 UNIT/ML injection Inject 0-15 Units into the skin 3 (three) times daily with meals. 08/17/12  Yes Lowella Dell, MD  letrozole [facility]) 2.5 MG tablet Take 1 tablet (2.5 mg total) by mouth daily. 08/17/12  Yes Lowella Dell, MD  levothyroxine (SYNTHROID, LEVOTHROID) 125 MCG tablet Take 125 mcg by mouth daily before breakfast. 03/31/12  Yes Madelin Headings, MD  lidocaine-prilocaine (EMLA) cream Apply 1 application  topically as needed (for port access). 07/01/12  Yes Amy Allegra Grana, PA-C  metoprolol succinate (TOPROL-XL) 25 MG 24 hr tablet Take 25 mg by mouth daily. Take with or immediately following a meal. 07/21/12  Yes Leroy Sea, MD  Multiple Vitamin (MULTIVITAMIN WITH MINERALS) TABS Take 1 tablet by mouth daily. 08/17/12  Yes Lowella Dell, MD  omeprazole (PRILOSEC) 20 MG capsule Take 1 capsule (20 mg total) by mouth 2 (two) times daily. 11/18/11  Yes Madelin Headings, MD  ondansetron (ZOFRAN ODT) 4 MG disintegrating tablet Take 1 tablet (4 mg total) by mouth every 8 (eight) hours as needed for nausea. 08/01/12  Yes Gerhard Munch, MD  oxyCODONE-acetaminophen (PERCOCET/ROXICET) 5-325 MG per tablet Take 1-2 tablets by mouth every 4 (four) hours as needed for pain. 08/02/12  Yes Raeford Razor, MD  saccharomyces boulardii (FLORASTOR) 250 MG capsule Take 1 capsule (250 mg total) by mouth 2 (two) times daily. 07/21/12  Yes Leroy Sea, MD  sodium chloride 0.9 % injection 10-40 mLs by Intracatheter route as needed (flush). 08/17/12  Yes Lowella Dell, MD  sodium chloride 0.9 % SOLN 1,000 mL with potassium chloride 2 mEq/ml SOLN 10 mEq Inject 75 mL/hr into the vein continuous. 08/17/12  Yes Lowella Dell, MD    Inpatient Medications:  . [START ON 08/25/2012] aspirin EC  81 mg Oral q morning - 10a  . atorvastatin  10 mg Oral QHS  . [START ON 08/25/2012] buPROPion  150 mg Oral q morning - 10a  . cefTRIAXone (ROCEPHIN)  IV  1 g Intravenous Q24H  . cholestyramine  4 g Oral BID  . docusate sodium  100 mg Oral BID  . enoxaparin (LOVENOX) injection  40 mg Subcutaneous QHS  . feeding supplement  1 Container Oral BID BM  . furosemide  60 mg Intravenous Q8H  . insulin aspart  0-15 Units Subcutaneous TID WC  . letrozole  2.5 mg Oral Daily  . [START ON 08/25/2012] levothyroxine  125 mcg Oral QAC breakfast  . metoprolol  5 mg Intravenous Once  . multivitamin with minerals  1 tablet Oral Daily  . [START ON  08/25/2012] pantoprazole  40 mg Oral Q0600  . saccharomyces boulardii  250 mg Oral BID  . sodium chloride  3 mL Intravenous Q12H  . sodium chloride  3 mL Intravenous Q12H    Allergies:  Allergies  Allergen Reactions  . Olmesartan Medoxomil Cough    REACTION: ? if cough  . Tetracycline Hcl     Unknown reaction, too long for patient to remember   . Venlafaxine     REACTION: severe dry moouth  . Adhesive (Tape) Rash    History   Social History  . Marital Status: Married    Spouse  Name: N/A    Number of Children: 2  . Years of Education: N/A   Occupational History  . SECRETARY [employer]   Social History Main Topics  . Smoking status: Former Games developer  . Smokeless tobacco: Never Used  . Alcohol Use: No  . Drug Use: No  . Sexually Active: No   Other Topics Concern  . Not on file   Social History Narrative   Married   Works at [employer] secretary   Daily caffeine use - 2 cups a day plus a couple of sodas a day   Pt doesn't exercise regularly   G2P2   H H of 3 soon to be 4 .      Family History  Problem Relation Age of Onset  . Diabetes Mother   . Hypertension Mother   . Arthritis Mother   . Heart disease Mother   . Heart failure Mother   . Other Mother     benign breast mass  . Stroke Father   . Heart disease Father   . Diabetes Paternal Grandmother   . Colon cancer Paternal Grandfather      ROS:  Please see the history of present illness.     All other systems reviewed and negative.    Physical Exam:   Blood pressure 101/75, pulse 115, temperature 97.5 F (36.4 C), temperature source Oral, resp. rate 23, SpO2 94.00%. General:  ill-appearing sallow shaeded Caucasian  female in no acute distress. Head: Normocephalic, atraumatic, sclera non-icteric, no xanthomas, nares are without discharge. she is wearing a head covering  EENT: normal  Lymph Nodes:  none Neck: Negative for carotid bruits. JVD not elevated. Back: Not examined  Lungs: Clear  bilaterally  laterally  Heart: Irregularly irregular rate and rhythm is fast with a 2/6 systolic murmur  . No rubs, or gallops appreciated. Abdomen: Soft, non-tender, non-distended with normoactive bowel sounds. No hepatomegaly. No rebound/guarding. No obvious abdominal masses. Msk:  Strength and tone appear normal for age. Extremities: No clubbing or cyanosis.  3-4+ edema.  Distal pedal pulses are 2+ and equal bilaterally. Skin: Warm and Dry Neuro: Alert and oriented X 3. CN III-XII intact Grossly normal sensory and motor function . Psych:  Responds to questions appropriately with a normal affect.      Labs: Cardiac Enzymes No results found for this basename: CKTOTAL, CKMB, TROPONINI,  in the last 72 hours CBC Lab Results  Component Value Date   WBC 8.4 08/24/2012   HGB 13.1 08/24/2012   HCT 39.2 08/24/2012   MCV 81.0 08/24/2012   PLT 136* 08/24/2012   PROTIME: No results found for this basename: LABPROT, INR,  in the last 72 hours Chemistry  Recent Labs Lab 08/24/12 1225  NA 134*  K 3.6  CL 102  CO2 22  BUN 10  CREATININE 0.72  CALCIUM 7.5*  PROT 5.5*  BILITOT 0.6  ALKPHOS 242*  ALT 41*  AST 50*  GLUCOSE 83   Lipids Lab Results  Component Value Date   CHOL 168 10/14/2011   HDL 48.70 10/14/2011   LDLCALC 101* 10/14/2011   TRIG 93.0 10/14/2011   BNP Pro B Natriuretic peptide (BNP)  Date/Time Value Range Status  08/24/2012 12:25 PM 1816.0* 0 - 125 pg/mL Final   Miscellaneous No results found for this basename: DDIMER    Radiology/Studies:  Dg Chest 2 View  08/24/2012  *RADIOLOGY REPORT*  Clinical Data: Shortness of breath, history of breast cancer  CHEST - 2 VIEW  Comparison: 08/03/2012  Findings: Cardiomediastinal silhouette is stable.  Left subclavian Port-A-Cath is unchanged in position.  There is small right pleural effusion with right basilar atelectasis or infiltrate.  Trace left basilar atelectasis or infiltrate.  Nodular density left base measures about 1 cm.   This may represent nipple shadow.  Repeat frontal view of the chest with nipple markers is recommended.  The patient is status post right mastectomy.  IMPRESSION: Status post right mastectomy. Small right pleural effusion with right basilar atelectasis or infiltrate. Trace left basilar atelectasis or infiltrate.  Nodular density left base may represent nipple shadow.  Repeat frontal view of the chest with nipple markers is recommended.   Original Report Authenticated By: Natasha Mead, M.D.    X-ray Chest Pa And Lateral   08/03/2012  *RADIOLOGY REPORT*  Clinical Data: Weakness, CHF, history breast cancer  CHEST - 2 VIEW  Comparison: 07/12/2012  Findings: Stable left subclavian power port catheter with the tip in the lower SVC region.  Normal heart size and vascularity. Postop changes from right mastectomy and axillary lymph node dissection.  Negative for CHF, pneumonia, collapse, consolidation, effusion or pneumothorax.  Trachea is midline.  Slightly lower lung volumes than the prior exam.  IMPRESSION: Stable postoperative findings.  No superimposed acute process   Original Report Authenticated By: Judie Petit. Miles Costain, M.D.    Abd 1 View (kub)  08/03/2012  *RADIOLOGY REPORT*  Clinical Data: Weakness, CHF, breast cancer, pain  ABDOMEN - 1 VIEW  Comparison: 07/05/2012  Findings: Visualized lung bases clear.  Nonobstructive bowel gas pattern.  Prior cholecystectomy evident.  No significant dilatation or ileus.  Degenerative changes of the lower lumbar spine and SI joints. Advance left hip degenerative arthritic changes with sclerosis and subchondral lucency of the femoral head and also sclerosis of the acetabulum.  No malalignment or definite fracture.  Pelvis appears intact.  Tubal ligation clips noted.  Pelvic calcifications presumably from degenerated fibroids.  IMPRESSION: Nonobstructive bowel gas pattern.  Lower lumbar spine, SI joint and hip degenerative changes, most pronounced involving the left hip as described.  No  acute osseous finding.   Original Report Authenticated By: Judie Petit. Shick, M.D.     EKG: * fibrillation with a rapid rate at 140 telemetry rates to  110    Assessment and Plan:     Principal Problem:   A-fib Active Problems:   HYPOTHYROIDISM   DIABETES MELLITUS, TYPE II   Morbid obesity   DEPRESSION   GERD   DVT, HX OF   Liver disease, chronic, with cirrhosis   Cancer of breast   Obesity, unspecified   Severe malnutrition in the context of acute illness   Anasarca   UTI (lower urinary tract infection)   The patient has atrial fibrillation with a rapid rate. Is not clear to me that she has had sinus rhythm at all in the last month or so. She has no treatable symptoms but won't be long before she she is at risk for developing cardiomyopathy from rapid rates.  It is also not clear to me why it isn't related to rapid. She is extremely stressed I would expect from her malnutrition hypoalbuminemia and volume overload. The atrial fibrillation may well be secondary to that. He is controlling his rate becomes important and hypotension has precluded the use of the last month of beta blockers and diltiazem at sufficient doses to control the rate. Hence, at least in the short-term, we will use amiodarone at a rate controlling agent. Potentially allow [name]  also to consider cardioversion. We'll begin her on Rivaroxaban as an anticoagulant so to allow [name] to cardiovert her.  The absence of neck veins today speaks to the absence of significant pericardial effusion is potential contributor. Notably there is no effusion seen on echo last month.  I would continue diuresis. I spoke with Dr. [name]. Clearly a  Way  needs to be found to provide her nutrition   Sherryl Manges

## 2012-08-24 NOTE — ED Notes (Signed)
ZOX:[id number]<AV> Expected date:<BR> Expected time:<BR> Means of arrival:<BR> Comments:<BR> Ems/ elderly sob leg swelling

## 2012-08-24 NOTE — ED Provider Notes (Signed)
Medical screening examination/treatment/procedure(s) were conducted as a shared visit with non-physician practitioner(s) and myself.  I personally evaluated the patient during the encounter.  56yF with worsening peripheral edema. Noted to be in afib with RVR then subsequently occasional NSVT up to 8 beats. Pt asymptomatic.  Low dose metoprolol given.  Little difference in rate and SBP around 100 now. Pt started on cardizem gtt.  Per review of records, pt with hx of same. On metoprolol and cardizem. Reports compliance with all meds. Minimal hyponatremia, otherwise 'lytes and renal function ok. Dose lasix given. Incidentally noted UTI. Rocephin. Question infiltrate on CXR. Clinically suspect actually atelectasis.   Raeford Razor, MD 08/24/12 1356

## 2012-08-24 NOTE — ED Notes (Signed)
Report given to Cynthia,RN.

## 2012-08-24 NOTE — Progress Notes (Signed)
Pt placed on cpap auto titration mode 4-20 cmH2O at this time. Pt has her nasal prongs & circuit from home. Hooked this to our machine with 2L O2 bleed in. Humidification chamber filled with sterile water to fill line. Pt tolerating well at this time.  Jacqulynn Cadet RRT

## 2012-08-24 NOTE — Telephone Encounter (Signed)
sw pt husband and he informed me that they are in the ED gv appt d/t. However he was not sure how things was going to turn out...td

## 2012-08-24 NOTE — ED Notes (Signed)
Report called to [facility].

## 2012-08-24 NOTE — Progress Notes (Signed)
[facility] ED CM noted CM consult. Cm spoke with Kristen, [facility] coordinator to inform of pt admission Pt to be followed for d/c needs  

## 2012-08-24 NOTE — ED Provider Notes (Signed)
History     CSN: [id number]  Arrival date & time 08/24/12  1122   First MD Initiated Contact with Patient 08/24/12 1144      Chief Complaint  Patient presents with  . Leg Swelling    (Consider location/radiation/quality/duration/timing/severity/associated sxs/prior treatment) HPI Comments: 57 year old female with a past medical history of hypertension, atrial fibrillation, and diabetes who presents for bilateral lower extremity swelling x6 days. Patient states swelling has been gradually worsening since her hospital discharge on 08/18/2012. She denies any aggravating or alleviating factors. Patient states her oncologist took her off of her Lasix prior to her discharge during her last hospital stay. Patient denies pain in her bilateral lower extremities. She further denies fever, lightheadedness or dizziness, chest pain, shortness of breath, nausea or vomiting, abdominal pain, and numbness or tingling in her extremities. Patient was diagnosed with atrial fibrillation prior to her last hospital stay. She is currently taking Lovenox IM as well as cardizem for this. PCP Sandford Craze and Oncologist is Dr. [name]. Patient endorses being asymptomatic at this time.  The history is provided by the patient. No language interpreter was used.    Past Medical History  Diagnosis Date  . Depression   . DVT (deep venous thrombosis)     hx of on HRT left leg ~2006  . GERD (gastroesophageal reflux disease)   . Hyperlipidemia   . Hypertension   . Hypothyroidism   . PPD positive, treated     rx inh   . OSA on CPAP   . Diabetes mellitus   . 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 Breast cancer     a. 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 Tubal ligation    . Cholecystectomy    . Foot surgery    . Eye surgery    .  Cataract extraction    . Percutaneous liver biopsy    . Breast biopsy      left breast  . Mastectomy modified radical  06/08/2012    Procedure: MASTECTOMY MODIFIED RADICAL;  Surgeon: Mariella Saa, MD;  Location: [facility] OR;  Service: General;  Laterality: Right;  . Portacath placement  06/08/2012    Procedure: INSERTION PORT-A-CATH;  Surgeon: Mariella Saa, MD;  Location: [facility] OR;  Service: General;  Laterality: Left;    Family History  Problem Relation Age of Onset  . Diabetes Mother   . Hypertension Mother   . Arthritis Mother   . Heart disease Mother   . Heart failure Mother   . Other Mother     benign breast mass  . Stroke Father   . Heart disease Father   . Diabetes Paternal Grandmother   . Colon cancer Paternal Grandfather     History  Substance Use Topics  . Smoking status: Former Games developer  . Smokeless tobacco: Never Used  . Alcohol Use: No    OB History   Grav Para Term Preterm Abortions TAB SAB Ect Mult Living   2 2              Review of Systems  Constitutional: Positive for fatigue. Negative for fever and chills.  Eyes: Negative for visual disturbance.  Respiratory: Negative for cough, chest tightness and shortness of breath.   Cardiovascular: Positive for leg swelling. Negative for chest pain and palpitations.  Gastrointestinal: Negative for nausea, vomiting and abdominal pain.  Genitourinary:       +catheter; denies inappropriate or decreased drainage  Skin: Positive for color change.  Neurological: Negative for dizziness, light-headedness and numbness.  All other systems reviewed and are negative.    Allergies  Olmesartan medoxomil; Tetracycline hcl; Venlafaxine; and Adhesive  Home Medications   Current Outpatient Rx  Name  Route  Sig  Dispense  Refill  . antiseptic oral rinse (BIOTENE) LIQD   Mouth Rinse   15 mLs by Mouth Rinse route 2 (two) times daily as needed.   100 mL   3   . aspirin EC 81 MG tablet   Oral   Take 81 mg by mouth  every morning.         [name] atorvastatin (LIPITOR) 10 MG tablet   Oral   Take 10 mg by mouth at bedtime.    90 tablet   1     pt would like a 90 day supply   . buPROPion (WELLBUTRIN SR) 150 MG 12 hr tablet   Oral   Take 150 mg by mouth every morning.          . cholestyramine (QUESTRAN) 4 G packet   Oral   Take 1 packet (4 g total) by mouth 2 (two) times daily.   60 each   3   . diltiazem (CARDIZEM CD) 120 MG 24 hr capsule   Oral   Take 120 mg by mouth every morning.         . enoxaparin (LOVENOX) 40 MG/0.4ML injection   Subcutaneous   Inject 0.4 mLs (40 mg total) into the skin daily.   14 Syringe   1   . feeding supplement (RESOURCE BREEZE) LIQD   Oral   Take 1 Container by mouth 2 (two) times daily between meals.   60 Container   3   . insulin aspart (NOVOLOG) 100 UNIT/ML injection   Subcutaneous   Inject 0-15 Units into the skin 3 (three) times daily with meals.   1 vial   3   . letrozole (FEMARA) 2.5 MG tablet   Oral   Take 1 tablet (2.5 mg total) by mouth daily.   30 tablet   3   . levothyroxine (SYNTHROID, LEVOTHROID) 125 MCG tablet   Oral   Take 125 mcg by mouth daily before breakfast.         . lidocaine-prilocaine (EMLA) cream   Topical   Apply 1 application topically as needed (for port access).         . metoprolol succinate (TOPROL-XL) 25 MG 24 hr tablet   Oral   Take 25 mg by mouth daily. Take with or immediately following a meal.         . Multiple Vitamin (MULTIVITAMIN WITH MINERALS) TABS   Oral   Take 1 tablet by mouth daily.   30 tablet   3   . omeprazole (PRILOSEC) 20 MG capsule   Oral   Take 1 capsule (20 mg total) by mouth 2 (two) times daily.   180 capsule   2   . ondansetron (ZOFRAN ODT) 4 MG disintegrating tablet   Oral   Take 1 tablet (4 mg total) by mouth every 8 (eight) hours as needed for nausea.   20 tablet   0   . oxyCODONE-acetaminophen (PERCOCET/ROXICET) 5-325 MG per tablet   Oral   Take 1-2  tablets by mouth every 4 (four) hours as needed for pain.  20 tablet   0   . saccharomyces boulardii (FLORASTOR) 250 MG capsule   Oral   Take 1 capsule (250 mg total) by mouth 2 (two) times daily.   60 capsule   1   . sodium chloride 0.9 % injection   Intracatheter   10-40 mLs by Intracatheter route as needed (flush).   5 mL   3   . sodium chloride 0.9 % SOLN 1,000 mL with potassium chloride 2 mEq/ml SOLN 10 mEq   Intravenous   Inject 75 mL/hr into the vein continuous.   30 L   3     BP 101/75  Pulse 58  Temp(Src) 97.5 F (36.4 C) (Oral)  Resp 23  SpO2 94%  Physical Exam  Nursing note and vitals reviewed. Constitutional: She is oriented to person, place, and time. No distress.  Fatigued, but nontoxic appearing. In NAD.  HENT:  Head: Normocephalic and atraumatic.  Mouth/Throat: Oropharynx is clear and moist. No oropharyngeal exudate.  Eyes: Conjunctivae are normal. Pupils are equal, round, and reactive to light. No scleral icterus.  Neck: Normal range of motion. Neck supple.  Cardiovascular:  +irregularly irregular rhythm; rate tachycardic. Distal radial pulses 2+ b/l. DP and PT pulses 1+ b/l. 3+ pitting edema in b/l lower extremities; 1+pitting edema in b/l hands. Capillary refill <3 seconds b/l.  Pulmonary/Chest: Effort normal. No respiratory distress. She has no wheezes. She exhibits no tenderness.  +rhonchorous breath sounds in mid and lower lung fields b/l  Abdominal: Soft. There is no tenderness. There is no rebound and no guarding.  Lymphadenopathy:    She has no cervical adenopathy.  Neurological: She is alert and oriented to person, place, and time.  Skin: She is not diaphoretic. There is erythema.  + erythema on medial anterior aspect of left lower extremity without warmth or pain. B/l lower extremities shiny in appearance.  Psychiatric: She has a normal mood and affect. Her behavior is normal.    ED Course  Procedures (including critical care  time)  Labs Reviewed  PRO B NATRIURETIC PEPTIDE - Abnormal; Notable for the following:    Pro B Natriuretic peptide (BNP) 1816.0 (*)    All other components within normal limits  CBC WITH DIFFERENTIAL - Abnormal; Notable for the following:    RDW 24.0 (*)    Platelets 136 (*)    All other components within normal limits  COMPREHENSIVE METABOLIC PANEL - Abnormal; Notable for the following:    Sodium 134 (*)    Calcium 7.5 (*)    Total Protein 5.5 (*)    Albumin 1.6 (*)    AST 50 (*)    ALT 41 (*)    Alkaline Phosphatase 242 (*)    All other components within normal limits  URINALYSIS, ROUTINE W REFLEX MICROSCOPIC - Abnormal; Notable for the following:    APPearance TURBID (*)    Hgb urine dipstick LARGE (*)    Protein, ur 30 (*)    Nitrite POSITIVE (*)    Leukocytes, UA LARGE (*)    All other components within normal limits  URINE MICROSCOPIC-ADD ON - Abnormal; Notable for the following:    Bacteria, UA MANY (*)    All other components within normal limits  URINE CULTURE  URINE CULTURE  CBC  CREATININE, SERUM  MAGNESIUM  TSH  PROTIME-INR  APTT  TROPONIN I  TROPONIN I  TROPONIN I  HEMOGLOBIN A1C  POCT I-STAT TROPONIN I   Dg Chest 2 View  08/24/2012  *RADIOLOGY  REPORT*  Clinical Data: Shortness of breath, history of breast cancer  CHEST - 2 VIEW  Comparison: 08/03/2012  Findings: Cardiomediastinal silhouette is stable.  Left subclavian Port-A-Cath is unchanged in position.  There is small right pleural effusion with right basilar atelectasis or infiltrate.  Trace left basilar atelectasis or infiltrate.  Nodular density left base measures about 1 cm.  This may represent nipple shadow.  Repeat frontal view of the chest with nipple markers is recommended.  The patient is status post right mastectomy.  IMPRESSION: Status post right mastectomy. Small right pleural effusion with right basilar atelectasis or infiltrate. Trace left basilar atelectasis or infiltrate.  Nodular density  left base may represent nipple shadow.  Repeat frontal view of the chest with nipple markers is recommended.   Original Report Authenticated By: Natasha Mead, M.D.      Date: 08/24/2012 11:42  Rate: 150  Rhythm: atrial fibrillation  QRS Axis: normal  Intervals: normal  ST/T Wave abnormalities: nonspecific T wave changes  Conduction Disutrbances:none  Narrative Interpretation: A fib with rate at 150  Old EKG Reviewed: unchanged from 08/08/12  EKG of II and V1 at 12:26 significant for HR of 132. Patient in atrial fibrillation with RVR and 6 beats of SVT. Patient asymptomatic.   1. Atrial fibrillation   2. UTI (urinary tract infection)      MDM  Patient with PMH of breast CA, HTN, and A fib presents for worsening lower extremity swelling x 6 days. Patient taken off lasix by oncologist and patient states symptoms have been worsening ever since. Other than pitting edema, patient asymptomatic at this time. W/u to include CBC, CMP, BNP, troponin, UA, and CXR. Physical exam significant for 3+ pitting edema and atrial fibrillation; patient given metoprolol bolus and started on cardizem drip for atrial fibrillation with NSVT. 40mg  IV Lasix ordered by Dr. Juleen China for patient's pitting edema.  Urinalysis findings consistent with UTI; IV Rocephin and urine culture ordered. Patient has continued to remain asymptomatic. Hemodynamically stable and satting 95% on 2L [place].  Dr. Juleen China consulted with hospitalist, Dr. Janee Morn for admission. Patient to be admitted to step down; cardiology to be consulted regarding a.fib with RVR. Patient work up and management discussed with Dr. Juleen China who is in agreement.     Antony Madura, [place] 08/27/12 [phone]

## 2012-08-24 NOTE — Progress Notes (Signed)
COURTESY NOTE:  Patient has been readmitted with poorly controlled heart rate in the setting of A Fib. From the point of view of breast cancer, though no further chemotherapy is planned, she has a good prognosis and whatever interventions may be helpful to hear from a cardiac or other point of view should proceed as planned.  SUMMARY:  57 y.o. [name]   (1) status post right mastectomy under the care of Dr. 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 Lymphovascular perineural invasion was identified. 2 of 18 lymph nodes were involved. Margins were clear. Pathologic staging pT3, pN1a, stage IIIA.   (2) treated in the adjuvant setting with docetaxel/cyclophosphamide given every 3 weeks, with Neulasta on day 2 for granulocyte support. First treatment was given on 07/06/2012. Due to intolerance, we decreased her doses by 15% beginning cycle 2, given 07/29/2012. However, tolerance remained poor and no further chemotherapy is planned .  (3) patient will need postmastectomy radiation, to be followed by antiestrogen therapy.   (4) comorbidities include diabetes, hypertension, chronic liver disease, malnutrition and c diff negative diarrhea  Appreciate your help to this patient!

## 2012-08-25 ENCOUNTER — Inpatient Hospital Stay (HOSPITAL_COMMUNITY): Payer: 59

## 2012-08-25 ENCOUNTER — Ambulatory Visit: Payer: 59 | Admitting: Physician Assistant

## 2012-08-25 ENCOUNTER — Other Ambulatory Visit: Payer: 59 | Admitting: Lab

## 2012-08-25 DIAGNOSIS — L02419 Cutaneous abscess of limb, unspecified: Secondary | ICD-10-CM

## 2012-08-25 DIAGNOSIS — I059 Rheumatic mitral valve disease, unspecified: Secondary | ICD-10-CM

## 2012-08-25 DIAGNOSIS — I509 Heart failure, unspecified: Secondary | ICD-10-CM

## 2012-08-25 DIAGNOSIS — L03116 Cellulitis of left lower limb: Secondary | ICD-10-CM | POA: Diagnosis present

## 2012-08-25 LAB — BASIC METABOLIC PANEL
BUN: 12 mg/dL (ref 6–23)
CO2: 25 mEq/L (ref 19–32)
CO2: 25 mEq/L (ref 19–32)
Chloride: 100 mEq/L (ref 96–112)
GFR calc Af Amer: >90 mL/min (ref >90)
GFR calc Af Amer: >90 mL/min (ref >90)
Potassium: 2.7 mEq/L — CL (ref 3.5–5.1)
Sodium: 132 mEq/L — ABNORMAL LOW (ref 135–145)

## 2012-08-25 LAB — GLUCOSE, CAPILLARY
Glucose-Capillary: 100 mg/dL — ABNORMAL HIGH (ref 70–99)
Glucose-Capillary: 104 mg/dL — ABNORMAL HIGH (ref 70–99)
Glucose-Capillary: 118 mg/dL — ABNORMAL HIGH (ref 70–99)

## 2012-08-25 LAB — TSH: TSH: 4.183 u[IU]/mL (ref 0.350–4.500)

## 2012-08-25 LAB — PROTIME-INR
INR: 2.98 — ABNORMAL HIGH (ref 0.00–1.49)
Prothrombin Time: 29.4 seconds — ABNORMAL HIGH (ref 11.6–15.2)

## 2012-08-25 LAB — CBC
Hemoglobin: 12.5 g/dL (ref 12.0–15.0)
MCH: 27.7 pg (ref 26.0–34.0)
MCV: 81.4 fL (ref 78.0–100.0)
RBC: 4.52 MIL/uL (ref 3.87–5.11)
WBC: 7.5 10*3/uL (ref 4.0–10.5)

## 2012-08-25 MED ORDER — PHYTONADIONE 5 MG PO TABS
5.0000 mg | ORAL_TABLET | Freq: Once | ORAL | Status: AC
  Administered 2012-08-25: 5 mg via ORAL
  Filled 2012-08-25: qty 1

## 2012-08-25 MED ORDER — FUROSEMIDE 10 MG/ML IJ SOLN
60.0000 mg | Freq: Two times a day (BID) | INTRAMUSCULAR | Status: DC
  Administered 2012-08-25 – 2012-09-03 (×13): 60 mg via INTRAVENOUS
  Filled 2012-08-25 (×18): qty 6

## 2012-08-25 MED ORDER — VANCOMYCIN HCL IN DEXTROSE 1-5 GM/200ML-% IV SOLN
1000.0000 mg | Freq: Two times a day (BID) | INTRAVENOUS | Status: DC
  Administered 2012-08-25 – 2012-08-28 (×6): 1000 mg via INTRAVENOUS
  Filled 2012-08-25 (×7): qty 200

## 2012-08-25 MED ORDER — HEPARIN (PORCINE) IN NACL 100-0.45 UNIT/ML-% IJ SOLN
1150.0000 [IU]/h | INTRAMUSCULAR | Status: DC
  Administered 2012-08-25: 1150 [IU]/h via INTRAVENOUS
  Filled 2012-08-25: qty 250

## 2012-08-25 MED ORDER — POTASSIUM CHLORIDE CRYS ER 20 MEQ PO TBCR
40.0000 meq | EXTENDED_RELEASE_TABLET | ORAL | Status: AC
  Administered 2012-08-25 (×2): 40 meq via ORAL
  Filled 2012-08-25: qty 1
  Filled 2012-08-25: qty 2

## 2012-08-25 MED ORDER — POTASSIUM CHLORIDE CRYS ER 20 MEQ PO TBCR
40.0000 meq | EXTENDED_RELEASE_TABLET | Freq: Once | ORAL | Status: AC
  Administered 2012-08-25: 40 meq via ORAL
  Filled 2012-08-25: qty 2

## 2012-08-25 MED ORDER — POTASSIUM CHLORIDE 10 MEQ/100ML IV SOLN
10.0000 meq | INTRAVENOUS | Status: AC
  Administered 2012-08-25 (×4): 10 meq via INTRAVENOUS

## 2012-08-25 MED ORDER — FUROSEMIDE 10 MG/ML IJ SOLN
INTRAMUSCULAR | Status: AC
  Filled 2012-08-25: qty 8

## 2012-08-25 MED ORDER — MAGNESIUM SULFATE 50 % IJ SOLN
3.0000 g | Freq: Once | INTRAVENOUS | Status: AC
  Administered 2012-08-25: 3 g via INTRAVENOUS
  Filled 2012-08-25: qty 6

## 2012-08-25 MED ORDER — VANCOMYCIN HCL 10 G IV SOLR
2000.0000 mg | Freq: Once | INTRAVENOUS | Status: AC
  Administered 2012-08-25: 2000 mg via INTRAVENOUS
  Filled 2012-08-25: qty 2000

## 2012-08-25 NOTE — Progress Notes (Signed)
[facility]  Patient Status: Active (receiving services up to time of hospitalization)  [facility] is providing the following services: RN, PT, OT and HHA  If patient discharges after hours, please call [phone].   Lanae Crumbly 08/25/2012, 11:16 AM

## 2012-08-25 NOTE — Progress Notes (Signed)
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 Patient is presenting to the ED with worsening lower extremity edema. Patient was discharged from the hospital to a skilled nursing facility however however after 2-3 days at the facility patient and family were not happy with the services received at the skilled nursing facility stating that patient was left in the wheelchair all day and also patient's medications were not being given  the way they were supposed to and a such patient went home with home health services.  Home health nurse today per family daughter that patient had worsening lower extremity edema she stated that the patient sounded congested on exam and subsequently patient was sent to the emergency room. Patient denies any fever, no chills, no nausea, no vomiting, no chest pain, no shortness of breath, no abdominal pain, no diarrhea, no constipation, no dysuria, no generalized weakness.  Patient was seen in the emergency room was noted to have a heart rate in the 150s and subsequently placed on a Cardizem drip after being given a dose of IV metoprolol. Patient also noted on exam to have lower extremity edema per ED physician and 40 of Lasix were given. Chest x-ray which was done did show some infiltrates concerning for atelectasis versus infiltrate. CBC done had a normal white count. Comprehensive metabolic profile did have a sodium of 134 elevated alk phosphatase of 242 albumin of 1.6  AST of 50 ALT of 41 protein of 5.5 otherwise was within normal limits. EKG showed atrial fibrillation with RVR. Urinalysis done was nitrite positive leukocytes large WBCs with too numerous to count.   Objective:    Vital Signs:   Temp:  [97.1 F (36.2 C)-98.5 F (36.9 C)] 98.1 F (36.7 C) (04/16 0800) Pulse Rate:  [48-150] 98 (04/16 0600) Resp:  [14-24] 17 (04/16 0600) BP: (91-114)/(51-75) 104/65 mmHg (04/16 0600) SpO2:  [93 %-98 %] 98 % (04/16 0600) Weight:  [213 lb 13.5 oz (97 kg)] 213 lb 13.5 oz (97 kg) (04/15 1600)  Last BM Date: 08/24/12   24-hour weight change: Weight change:   Weight trends: Filed Weights   08/24/12 1600  Weight: 213 lb 13.5 oz (97 kg)    Intake/Output:  04/15 0701 - 04/16 0700 In: 1364.8 [P.O.:720; I.V.:494.8; IV Piggyback:150] Out: 5275 [Urine:5275]     Physical Exam: BP 104/65  Pulse 98  Temp(Src) 98.1 F (36.7 C) (Axillary)  Resp 17  Ht 5\' 4"  (1.626 m)  Wt 213 lb 13.5 oz (97 kg)  BMI 36.69 kg/m2  SpO2 98%  General: Vital signs reviewed and noted. She is chronically ill appearing.   Head: Normocephalic, atraumatic.  Eyes: conjunctivae/corneas clear.  EOM's intact.   Throat: normal  Neck:  no JVD  Lungs:   clear  Heart:  Irreg. Irreg.  Abdomen:  Soft, non-tender, non-distended    Extremities: 3+ soft pitting edema   Neurologic: A&O X3, CN II - XII are grossly intact.   Psych: Normal     Labs: BMET:  Recent Labs  08/24/12 1225 08/25/12 0400  NA 134* 136  K 3.6 2.7*  CL 102 100  CO2 22 25  GLUCOSE 83 108*  BUN 10 11  CREATININE 0.72 0.80  CALCIUM 7.5* 7.2*  MG 1.8 1.6    Liver function tests:  Recent Labs  08/24/12 1225  AST 50*  ALT 41*  ALKPHOS 242*  BILITOT 0.6  PROT 5.5*  ALBUMIN 1.6*   No results found for this basename: LIPASE, AMYLASE,  in the last 72 hours  CBC:  Recent Labs  08/24/12 1225 08/25/12 0400  WBC 8.4 7.5  NEUTROABS 6.2  --   HGB 13.1 12.5  HCT 39.2 36.8  MCV 81.0 81.4  PLT  136* 112*    Cardiac Enzymes:  Recent Labs  08/24/12 1640 08/24/12 2200 08/25/12 0400  TROPONINI <0.30 <0.30 <0.30    Coagulation Studies:  Recent Labs  08/24/12 1225  LABPROT 25.4*  INR 2.44*    Other: No components found with this basename: POCBNP,  No results found for this basename: DDIMER,  in the last 72 hours  Recent Labs  08/24/12 1225  HGBA1C 6.1*   No results found for this basename: CHOL, HDL, LDLCALC, TRIG, CHOLHDL,  in the last 72 hours  Recent Labs  08/24/12 1225  TSH 4.183   No results found for this basename: VITAMINB12, FOLATE, FERRITIN, TIBC, IRON, RETICCTPCT,  in the last 72 hours   Other results:  Tele:  Atrial fib with rate of 105  Medications:    Infusions: . diltiazem (CARDIZEM) infusion 5 mg/hr (08/25/12 0600)    Scheduled Medications: . amiodarone  400 mg Oral BID  . antiseptic oral rinse  15 mL Mouth Rinse BID  . apixaban  5 mg Oral BID  . aspirin EC  81 mg Oral q morning - 10a  . atorvastatin  10 mg Oral QHS  . buPROPion  150 mg Oral q morning - 10a  . cefTRIAXone (ROCEPHIN)  IV  1 g Intravenous Q24H  . cholestyramine  4 g Oral BID  . docusate sodium  100 mg Oral BID  . feeding supplement  1 Container Oral BID BM  . furosemide      . furosemide  60 mg Intravenous Q8H  . insulin aspart  0-15 Units Subcutaneous TID WC  . letrozole  2.5 mg Oral Daily  . levothyroxine  125 mcg Oral QAC breakfast  . magnesium sulfate 1 - 4 g bolus IVPB  3 g Intravenous Once  . megestrol  400 mg Oral BID  . metoprolol  5 mg Intravenous Once  . multivitamin with minerals  1 tablet Oral Daily  . pantoprazole  40 mg Oral Q0600  . potassium chloride  10 mEq Intravenous Q1 Hr x 4  . potassium chloride  40 mEq Oral Q4H  . saccharomyces boulardii  250 mg Oral BID  . sodium chloride  3 mL Intravenous Q12H  . sodium chloride  3 mL Intravenous Q12H    Assessment/ Plan:   Principal Problem:   A-fib Active Problems:   HYPOTHYROIDISM    DIABETES MELLITUS, TYPE II   Morbid obesity   DEPRESSION   OBSTRUCTIVE SLEEP APNEA   GERD   DVT, HX OF   Liver disease, chronic, with cirrhosis   Cancer of breast   Hypokalemia   Obesity, unspecified   Severe malnutrition in the context of acute illness   Anasarca   UTI (lower urinary tract infection)   CHF, acute Hypokalemia- replaced   Tiye has normal LV function by echo 07/14/12 ( repeat echo has been ordered).  Her rapid HR is due to the stress of her underlying illnesses ( breast cancer, chemotherapy, malnutrition)  as noted in Dr. Odessa Fleming note from yesterday.    He generalized edema is more likely to be due to her severe protein malnutrition ( albumin of 1.6) and not LV dysfunction.  She may have some degree of diastolic dysfunction which would be exacerbated by her atrial fib and by the rapid rate but her systolic function is well preserved.  I do not think her edema will resolve with diuresis alone but will take overall improvement in her condition ( especially nutritional status) to resolve.  i have decreased her Lasix frequency to Q12 HR for now.      Disposition:  Length of Stay: 1  Vesta Mixer, Montez Hageman., MD, [facility] 08/25/2012, 8:45 AM Office [phone] Pager [phone]

## 2012-08-25 NOTE — Progress Notes (Signed)
Spoke with pt about nighttime CPAP, pt prefers to self administer when ready. Has nasal pillows from home, checked humidification and instructed pt to have RN call RT if any additional assistance throughout night is needed.

## 2012-08-25 NOTE — Progress Notes (Signed)
ANTICOAGULATION CONSULT NOTE - Initial Consult  Pharmacy Consult for IV Heparin Indication: Afib with RVR  Allergies  Allergen Reactions  . Olmesartan Medoxomil Cough    REACTION: ? if cough  . Tetracycline Hcl     Unknown reaction, too long for patient to remember   . Venlafaxine     REACTION: severe dry moouth  . Adhesive (Tape) Rash    Patient Measurements: Height: 5\' 4"  (162.6 cm) Weight: 213 lb 13.5 oz (97 kg) IBW/kg (Calculated) : 54.7 Heparin Dosing Weight: 77kg  Vital Signs: Temp: 98.1 F (36.7 C) (04/16 0800) Temp src: Axillary (04/16 0800) BP: 104/64 mmHg (04/16 0900) Pulse Rate: 88 (04/16 0900)  Labs:  Recent Labs  08/24/12 1225 08/24/12 1640 08/24/12 2200 08/25/12 0400  HGB 13.1  --   --  12.5  HCT 39.2  --   --  36.8  PLT 136*  --   --  112*  APTT 38*  --   --   --   LABPROT 25.4*  --   --   --   INR 2.44*  --   --   --   CREATININE 0.72  --   --  0.80  TROPONINI  --  <0.30 <0.30 <0.30    Estimated Creatinine Clearance: 86.6 ml/min (by C-G formula based on Cr of 0.8).   Medical History: Past Medical History  Diagnosis Date  . Depression   . DVT (deep venous thrombosis)     hx of on HRT left leg ~2006  . GERD (gastroesophageal reflux disease)   . Hyperlipidemia   . Hypertension   . Hypothyroidism   . PPD positive, treated     rx inh   . OSA on CPAP   . Diabetes mellitus   . 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 Breast cancer     a. 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  PMHx significant for breast cancer s/p mastectomy, chemotherapy with no further cycles planned, newly diagnosed Afib, and CHF.  Cardiology consulted for Afib with RVR, started apixaban 4/15.  Pt now has plans for PEG tube placement and will be switched to IV  heparin bridge.   1st dose apixaban given 4/15 @ 2134  Renal function: SCr stable, CrCl ~87 ml/min  H/H WNL, stable, plts low, small drop.  No bleeding reported/documented per RN  Package insert for apixaban states anticoagulant bridging is not usually required during the 24 to 48 hours after stopping apixaban and prior to the procedure. Discontinue at least 24 hours prior to elective surgery or invasive procedures with a low risk of bleeding or where bleeding would be noncritical and easily controlled  IR to evaluate pt for PEG tube placement today.  It is unclear if/when procedure will take place, therefore will start IV heparin now. May resume apixaban when adequate hemostasis established.   Goal of Therapy:  Heparin level 0.3-0.7 units/ml Monitor platelets by anticoagulation protocol: Yes   Plan:  1.  Begin IV heparin infusion @ 1150 units/hr (=11.61ml/hr), no bolus as pt received apixaban last night and may still have anticoagulation effects 2.  Daily CBC, heparin level in 6 hours  Miller Edgington E 08/25/2012,9:31 AM

## 2012-08-25 NOTE — Progress Notes (Signed)
OT Cancellation Note  Patient Details Name: [name] MRN: [id number] DOB: 1956-04-09   Cancelled Treatment:    Reason Eval/Treat Not Completed: Medical issues which prohibited therapy.  Pt on a drip and has decreased BP.  Will check back tomorrow.    Danitza Schoenfeldt 08/25/2012, 2:44 PM [name], OTR/L [phone] 08/25/2012

## 2012-08-25 NOTE — Progress Notes (Addendum)
ANTICOAGULATION CONSULT NOTE - Follow-up Consult  Pharmacy Consult for IV Heparin Indication: Afib with RVR  Allergies  Allergen Reactions  . Olmesartan Medoxomil Cough    REACTION: ? if cough  . Tetracycline Hcl     Unknown reaction, too long for patient to remember   . Venlafaxine     REACTION: severe dry moouth  . Adhesive (Tape) Rash    Patient Measurements: Height: 5\' 4"  (162.6 cm) Weight: 213 lb 13.5 oz (97 kg) IBW/kg (Calculated) : 54.7 Heparin Dosing Weight: 77kg  Vital Signs: Temp: 98.2 F (36.8 C) (04/16 1200) Temp src: Oral (04/16 1200) BP: 97/55 mmHg (04/16 1400) Pulse Rate: 91 (04/16 1400)  Labs:  Recent Labs  08/24/12 1225 08/24/12 1640 08/24/12 2200 08/25/12 0400 08/25/12 1430  HGB 13.1  --   --  12.5  --   HCT 39.2  --   --  36.8  --   PLT 136*  --   --  112*  --   APTT 38*  --   --   --   --   LABPROT 25.4*  --   --   --  29.4*  INR 2.44*  --   --   --  2.98*  CREATININE 0.72  --   --  0.80  --   TROPONINI  --  <0.30 <0.30 <0.30  --     Estimated Creatinine Clearance: 86.6 ml/min (by C-G formula based on Cr of 0.8).   Medical History: Past Medical History  Diagnosis Date  . Depression   . DVT (deep venous thrombosis)     hx of on HRT left leg ~2006  . GERD (gastroesophageal reflux disease)   . Hyperlipidemia   . Hypertension   . Hypothyroidism   . PPD positive, treated     rx inh   . OSA on CPAP   . Diabetes mellitus   . 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 Breast cancer     a. 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  PMHx significant for breast cancer s/p mastectomy, chemotherapy with no further cycles planned, newly diagnosed Afib, CHF, malnutrition, and chronic liver disease with cirrhosis.   Please see consult note  written earlier today for full details.   Cardiology consulted for Afib with RVR, started apixaban 4/15.  Apixaban d/c'd earlier today due to need for PEG tube placement and IV Heparin bridge started.  Noted INR elevated (2.44)  prior to 1st dose of apixaban. INR has been slowly rising since March and could be due to multiple factors including chronic liver disease and malnutrition.    Apixaban can cause elevations in INR therefore measurements of INR will not accurately reflect true value until apixaban has been cleared.  Only 1 dose was given, therefore it should be eliminated quickly as renal function is stable.  Spoke with Dr. Janee Morn regarding elevated INR and need for anticoagulation, and heparin IV to be stopped for now and restarted if INR fall <2.    IR has seen pt in regards to PEG placement and want to wait at least 48 hours from apixaban dose for procedure AND for INR to be near normal level.  Apixaban given 4/15 @ 2134.  IR notes pt may need FFP to lower INR.  Again, it may  be hard to accurately assess INR initially.  FFP may be needed to be timed for procedure to lower INR transiently.    Plan:  1.  D/C IV heparin 2.  F/u daily PT/INR, CBC, procedure plans.    Haynes Hoehn, PharmD 08/25/2012 3:38 PM  Pager: [phone]

## 2012-08-25 NOTE — Progress Notes (Signed)
PT Cancellation Note  Patient Details Name: [name] MRN: [id number] DOB: [birth year]   Cancelled Treatment:    Reason Eval/Treat Not Completed: Medical issues which prohibited therapy, afib, on a drip,decr. BP.    Rada Hay 08/25/2012, 2:20 PM Blanchard Kelch PT [phone]

## 2012-08-25 NOTE — Progress Notes (Signed)
INITIAL NUTRITION ASSESSMENT  DOCUMENTATION CODES Per approved criteria  -Severe malnutrition in the context of chronic illness  Pt meets criteria for SEVERE MALNUTRITION in the context of chronic as evidenced by <75% of estimated energy requirements for 2 months and severe edema.   INTERVENTION: Recommend initiating tube feeds if PEG placed: Initiate Jevity 1.5 @ 10 ml/hr via PEG and increase by 10 ml every 8 hours to goal rate of 50 ml/hr. 30 ml Prostat BID.  At goal rate, tube feeding regimen will provide 2000 kcal, 106 grams of protein, and 912 ml of H2O. (This will meet 100% of estimated energy needs and 100% of estimated protein needs)  Recommend monitor magnesium, potassium, and phosphorus daily for at least 3 days after initiating tube feeds, MD to replete as needed, as pt is at risk for refeeding syndrome given poor po intake for the past 2 months in addition to severe malnutrition.   NUTRITION DIAGNOSIS: Inadequate po intake related to no appetite as evidenced by pt's report of eating very little for the past 2 months.   Goal: Pt to meet >/= 90% of their estimated nutrition needs  Monitor:  TF initiation/rate/tolerance Wt Labs  Reason for Assessment: Consult (to assess nutrition status/requirements)  57 y.o. female  Admitting Dx: A-fib  ASSESSMENT: 57 y.o. 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 Patient is presenting to the ED with worsening lower extremity edema. Patient was discharged from the hospital to a  skilled nursing facility however however after 2-3 days at the facility patient and family were not happy with the services received at the skilled nursing facility stating that patient was left in the wheelchair all day and also patient's medications were not being given toward the way they were supposed to and a such patient went home with home health services.  Pt reports that she has had poor po intake and no desire to eat for the past 2 months. Pt states she was not eating anything PTA but, was taking Florastor and a MVT daily. Pt denies any recent nausea, vomiting, abdominal discomfort, and/or gastrointestinal issues at this time. Pt and pt's husband state they agreed to PEG placement and are hoping it helps pt get over this hump and improve her health. Pt states that she loves to cook and would like to improve her health and appetite so that she can return to cooking at home.    Height: Ht Readings from Last 1 Encounters:  08/24/12 5\' 4"  (1.626 m)    Weight: Wt Readings from Last 1 Encounters:  08/24/12 213 lb 13.5 oz (97 kg)    Ideal Body Weight: 120 lbs  % Ideal Body Weight: 178%  Wt Readings from Last 10 Encounters:  08/24/12 213 lb 13.5 oz (97 kg)  08/17/12 220 lb 4.8 oz (99.927 kg)  07/29/12 185 lb 11.2 oz (84.233 kg)  07/29/12 185 lb 12.8 oz (84.278 kg)  07/23/12 207 lb 1.3 oz (93.931 kg)  07/21/12 195 lb 8.8 oz (88.7 kg)  07/05/12 201 lb 1.9 oz (91.227 kg)  07/01/12 196 lb 6.4 oz (89.086 kg)  06/29/12 197 lb (89.359 kg)  06/04/12 210 lb 11.2 oz (  95.573 kg)    Usual Body Weight: 198 lbs   % Usual Body Weight: 108% (likely due to edema)  BMI:  Body mass index is 36.69 kg/(m^2).  Estimated Nutritional Needs: Kcal: 1930-2100 Protein: 100-126 grams Fluid: 2.5 L (pt on 1200 ml fluid restriction)  Skin: +1 generalized edema, +2 RUE and LUE edema, +3 RLE and LLE edema; stage 1 pressure ulcer on heel  Diet Order: Cardiac  EDUCATION NEEDS: -Education not appropriate  at this time   Intake/Output Summary (Last 24 hours) at 08/25/12 1210 Last data filed at 08/25/12 1200  Gross per 24 hour  Intake 1364.79 ml  Output   5850 ml  Net -4485.21 ml    Last BM: 4/15  Labs:   Recent Labs Lab 08/24/12 1225 08/25/12 0400  NA 134* 136  K 3.6 2.7*  CL 102 100  CO2 22 25  BUN 10 11  CREATININE 0.72 0.80  CALCIUM 7.5* 7.2*  MG 1.8 1.6  GLUCOSE 83 108*    CBG (last 3)   Recent Labs  08/24/12 1634 08/24/12 2135 08/25/12 0752  GLUCAP 94 101* 100*    Scheduled Meds: . amiodarone  400 mg Oral BID  . antiseptic oral rinse  15 mL Mouth Rinse BID  . aspirin EC  81 mg Oral q morning - 10a  . atorvastatin  10 mg Oral QHS  . buPROPion  150 mg Oral q morning - 10a  . cefTRIAXone (ROCEPHIN)  IV  1 g Intravenous Q24H  . cholestyramine  4 g Oral BID  . docusate sodium  100 mg Oral BID  . feeding supplement  1 Container Oral BID BM  . furosemide      . furosemide  60 mg Intravenous Q12H  . insulin aspart  0-15 Units Subcutaneous TID WC  . letrozole  2.5 mg Oral Daily  . levothyroxine  125 mcg Oral QAC breakfast  . magnesium sulfate 1 - 4 g bolus IVPB  3 g Intravenous Once  . megestrol  400 mg Oral BID  . metoprolol  5 mg Intravenous Once  . multivitamin with minerals  1 tablet Oral Daily  . pantoprazole  40 mg Oral Q0600  . potassium chloride  40 mEq Oral Q4H  . saccharomyces boulardii  250 mg Oral BID  . sodium chloride  3 mL Intravenous Q12H  . sodium chloride  3 mL Intravenous Q12H  . vancomycin  2,000 mg Intravenous Once  . vancomycin  1,000 mg Intravenous Q12H    Continuous Infusions: . diltiazem (CARDIZEM) infusion 10 mg/hr (08/25/12 0915)  . heparin      Past Medical History  Diagnosis Date  . Depression   . DVT (deep venous thrombosis)     hx of on HRT left leg ~2006  . GERD (gastroesophageal reflux disease)   . Hyperlipidemia   . Hypertension   . Hypothyroidism   . PPD positive, treated     rx inh   . OSA on CPAP   .  Diabetes mellitus   . 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 Breast cancer     a. 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 Tubal ligation    . Cholecystectomy    . Foot surgery    . Eye surgery    . Cataract extraction    .  Percutaneous liver biopsy    . Breast biopsy      left breast  . Mastectomy modified radical  06/08/2012    Procedure: MASTECTOMY MODIFIED RADICAL;  Surgeon: Mariella Saa, MD;  Location: [facility] OR;  Service: General;  Laterality: Right;  . Portacath placement  06/08/2012    Procedure: INSERTION PORT-A-CATH;  Surgeon: Mariella Saa, MD;  Location: [facility] OR;  Service: General;  Laterality: Left;    Ian Malkin RD, LDN Inpatient Clinical Dietitian Pager: [phone] After Hours Pager: [phone]

## 2012-08-25 NOTE — Progress Notes (Addendum)
IR aware of request for G-tube to assist with nutritional needs. Chart reviewed, pt currently on reg diet and has started Megace, but remains with severe PCM due to past 1-2 months of poor oral intake...reason? UA is positive and Urine Cx is pending. Pt afeb and nl WBC. Currently on Heparin for a.fib. Was given 1 dose of Eliquis at 930pm last night, which has now been discontinued. Last INR 2.4. Have d/w Dr. Fredia Sorrow, who feels minimum of 48hrs necessary since last dose of Eliquis. Also need INR to be at or near normal.  Pt on IV Rocephin for presumed UTI with Cx pending. Friday would be earliest possibility for G-tube placement if INR decreases and she remains stable medically.  Had a discussion with pt and husband regarding indications for gastrostomy tube placement. Also discussed the procedure itself including her need to drink contrast the night before procedure. Discussed risks of bleeding, infection, and use of sedation. She is strongly in favor of feeding tube because she cannot tolerate the smells and tastes of any food/drinks. Will recheck INR tomorrow, if remains elevated, she may need FFP. If it returns to normal, we should be able to move forward with procedure.  Will cont to follow along.

## 2012-08-25 NOTE — Progress Notes (Signed)
  Echocardiogram 2D Echocardiogram has been performed.  Ellender Hose A 08/25/2012, 1:42 PM

## 2012-08-25 NOTE — Progress Notes (Signed)
[facility] PROGRESS NOTE  [name] WJX:[id number] DOB: 1956-02-20 DOA: 08/24/2012 PCP: [name]., NP  Assessment/Plan: #1 A. fib with RVR Questionable etiology. May be secondary to stress and severe malnutrition and ongoing cancer. Cardiac enzymes are negative x3. Pro BNP is elevated. 2-D echo is pending. Currently on a Cardizem drip and amiodarone for rate control. Patient was started on liquids for anticoagulation per cardiology in anticipation of possible cardioversion. Will D/c eliquis and place patient on full dose IV heparin in anticipation of possible PEG tube for her severe malnutrition. After PEG tube is placed a go back to anti-coagulation of choice per cardiology. Cardiology is following and appreciate input and recommendations.  #2  Anarsarca/acute CHF exacerbation Likely secondary to severe malnutrition in the setting of afib with RVR likely leading to acute CHF exacerbation with pulmonary edema. Patient's albumin is 1.6. Chest x-ray consistent with pulmonary edema. Patient is -3.9 L. Continue current dose of IV Lasix. Strict I.'s and O.'s. Daily weights. Patient has been started on Megace for appetite stimulation. Ensure added. Will change eliquis to IV heparin in anticipation of probable PEG tube placement per IR. 2-D echo is pending. Cardiology is following and appreciate input and recommendations.  #3 urinary tract infection Urine cultures are pending. Continue IV Rocephin.  #4 left lower extremity cellulitis Continue IV Rocephin. Will add IV vancomycin.  #5 gastroesophageal reflux disease PPI.  #6 type 2 diabetes Hemoglobin A1c 6.1. CBGs have ranged from 94-101. Continue sliding scale insulin.  #7 severe malnutrition Patient with 6-8 weeks of poor oral intake, decreased appetite. Megace started yesterday. Will asked IR to place PEG tube. Discussed with patient's oncologist Dr. [name].  #8 history of breast cancer status post right  mastectomy Pathologic staging PET 3, P. and 1, stage IIIa. ER and PR positive at 100%, HER -2/neu negative. Per oncology.  #9 chronic liver disease Continue diuretics.  #10 hypokalemia Secondary to diuretics. Replete.  #11 prophylaxis PPI for GI prophylaxis. Heparin for DVT prophylaxis.   Code Status: Full Family Communication: Updated patient and husband at bedside. Disposition Plan: Home when medically stable.   Consultants:  Cardiology: Dr. Graciela Husbands 08/24/2012  Oncology Dr. [name] 08/24/2012  Procedures:  2-D echo is pending 08/26/2038  Chest x-ray 08/24/2012, 08/25/2012  Antibiotics:  IV Rocephin 08/24/12  IV Vancomycin 08/25/12  HPI/Subjective: Patient denies any chest pain or shortness of breath. Patient stated she tried some put in last plan however is just unable to eat.  Objective: Filed Vitals:   08/25/12 0200 08/25/12 0400 08/25/12 0600 08/25/12 0800  BP: 99/59 111/69 104/65   Pulse: 90 100 98   Temp:  97.2 F (36.2 C)  98.1 F (36.7 C)  TempSrc:  Axillary  Axillary  Resp: 14 17 17    Height:      Weight:      SpO2: 93% 97% 98%     Intake/Output Summary (Last 24 hours) at 08/25/12 0846 Last data filed at 08/25/12 0600  Gross per 24 hour  Intake 1364.79 ml  Output   5275 ml  Net -3910.21 ml   Filed Weights   08/24/12 1600  Weight: 97 kg (213 lb 13.5 oz)    Exam:   General:  NAD. Anarsarca  Cardiovascular: Irregularly irregular  Respiratory: Diffuse crackles  Abdomen: Soft/NT/ND/+BS  Extremities: No c/c. 3-4+ BLE edema to hips. Left lower leg near the ankle with an area of erythema, and warmth.  Data Reviewed: Basic Metabolic Panel:  Recent Labs Lab 08/24/12 1225 08/25/12 0400  NA 134* 136  K 3.6 2.7*  CL 102 100  CO2 22 25  GLUCOSE 83 108*  BUN 10 11  CREATININE 0.72 0.80  CALCIUM 7.5* 7.2*  MG 1.8 1.6   Liver Function Tests:  Recent Labs Lab 08/24/12 1225  AST 50*  ALT 41*  ALKPHOS 242*  BILITOT 0.6  PROT  5.5*  ALBUMIN 1.6*   No results found for this basename: LIPASE, AMYLASE,  in the last 168 hours No results found for this basename: AMMONIA,  in the last 168 hours CBC:  Recent Labs Lab 08/24/12 1225 08/25/12 0400  WBC 8.4 7.5  NEUTROABS 6.2  --   HGB 13.1 12.5  HCT 39.2 36.8  MCV 81.0 81.4  PLT 136* 112*   Cardiac Enzymes:  Recent Labs Lab 08/24/12 1640 08/24/12 2200 08/25/12 0400  TROPONINI <0.30 <0.30 <0.30   BNP (last 3 results)  Recent Labs  08/24/12 1225 08/25/12 0400  PROBNP 1816.0* 1915.0*   CBG:  Recent Labs Lab 08/24/12 1634 08/24/12 2135 08/25/12 0752  GLUCAP 94 101* 100*    Recent Results (from the past 240 hour(s))  MRSA PCR SCREENING     Status: None   Collection Time    08/24/12  4:00 PM      Result Value Range Status   MRSA by PCR NEGATIVE  NEGATIVE Final   Comment:            The GeneXpert MRSA Assay (FDA     approved for NASAL specimens     only), is one component of a     comprehensive MRSA colonization     surveillance program. It is not     intended to diagnose MRSA     infection nor to guide or     monitor treatment for     MRSA infections.     Studies: Dg Chest 2 View  08/24/2012  *RADIOLOGY REPORT*  Clinical Data: Shortness of breath, history of breast cancer  CHEST - 2 VIEW  Comparison: 08/03/2012  Findings: Cardiomediastinal silhouette is stable.  Left subclavian Port-A-Cath is unchanged in position.  There is small right pleural effusion with right basilar atelectasis or infiltrate.  Trace left basilar atelectasis or infiltrate.  Nodular density left base measures about 1 cm.  This may represent nipple shadow.  Repeat frontal view of the chest with nipple markers is recommended.  The patient is status post right mastectomy.  IMPRESSION: Status post right mastectomy. Small right pleural effusion with right basilar atelectasis or infiltrate. Trace left basilar atelectasis or infiltrate.  Nodular density left base may represent  nipple shadow.  Repeat frontal view of the chest with nipple markers is recommended.   Original Report Authenticated By: Natasha Mead, M.D.    Portable Chest 1 View  08/25/2012  *RADIOLOGY REPORT*  Clinical Data: Atrial fibrillation, pleural effusion.  PORTABLE CHEST - 1 VIEW  Comparison: 08/24/2012 and PET 07/05/2012.  Findings: Trachea is midline.  Heart size stable.  Left subclavian Port-A-Cath tip projects over the SVC.  Mild diffuse bilateral air space disease, bibasilar predominant, with slight worsening from 08/24/2012.  Bilateral pleural effusions. Surgical clips in the right axillary region with changes of right mastectomy.  IMPRESSION: Favor worsening congestive heart failure.   Original Report Authenticated By: Leanna Battles, M.D.     Scheduled Meds: . amiodarone  400 mg Oral BID  . antiseptic oral rinse  15 mL Mouth Rinse BID  . apixaban  5 mg Oral BID  . aspirin  EC  81 mg Oral q morning - 10a  . atorvastatin  10 mg Oral QHS  . buPROPion  150 mg Oral q morning - 10a  . cefTRIAXone (ROCEPHIN)  IV  1 g Intravenous Q24H  . cholestyramine  4 g Oral BID  . docusate sodium  100 mg Oral BID  . feeding supplement  1 Container Oral BID BM  . furosemide      . furosemide  60 mg Intravenous Q8H  . insulin aspart  0-15 Units Subcutaneous TID WC  . letrozole  2.5 mg Oral Daily  . levothyroxine  125 mcg Oral QAC breakfast  . magnesium sulfate 1 - 4 g bolus IVPB  3 g Intravenous Once  . megestrol  400 mg Oral BID  . metoprolol  5 mg Intravenous Once  . multivitamin with minerals  1 tablet Oral Daily  . pantoprazole  40 mg Oral Q0600  . potassium chloride  10 mEq Intravenous Q1 Hr x 4  . potassium chloride  40 mEq Oral Q4H  . saccharomyces boulardii  250 mg Oral BID  . sodium chloride  3 mL Intravenous Q12H  . sodium chloride  3 mL Intravenous Q12H   Continuous Infusions: . diltiazem (CARDIZEM) infusion 5 mg/hr (08/25/12 0600)    Principal Problem:   A-fib Active Problems:    HYPOTHYROIDISM   DIABETES MELLITUS, TYPE II   Morbid obesity   DEPRESSION   OBSTRUCTIVE SLEEP APNEA   GERD   DVT, HX OF   Liver disease, chronic, with cirrhosis   Cancer of breast   Hypokalemia   Obesity, unspecified   Severe malnutrition in the context of acute illness   Anasarca   UTI (lower urinary tract infection)   CHF, acute   Cellulitis of leg, left    Time spent: > 35 mins    [facility]  [facility] Pager [phone]. If 7PM-7AM, please contact night-coverage at [email or web address], password [facility] 08/25/2012, 8:46 AM  LOS: 1 day

## 2012-08-25 NOTE — Progress Notes (Signed)
CRITICAL VALUE ALERT  Critical value received:  k 2.7 Date of notification:  08/25/12  Time of notification: 0455  Critical value read back:yes  Nurse who received alert:  c Tomi Paddock  MD notified (1st page):  [facility] NP-K Schorr Time of first page: 0456 MD notified (2nd page):  Time of second page:  Responding MD:  Merdis Delay  Time MD responded: [phone]

## 2012-08-25 NOTE — Progress Notes (Signed)
CARE MANAGEMENT NOTE 08/25/2012  Patient:  [name], [name]   Account Number:  [id number]  Date Initiated:  08/25/2012  Documentation initiated by:  DAVIS,RHONDA  Subjective/Objective Assessment:   pt readmitted due to a.fib and requiring iv cardizem drip, hx of ovarian ca     Action/Plan:   snf   Anticipated DC Date:  08/28/2012   Anticipated DC Plan:  SKILLED NURSING FACILITY  In-house referral  Clinical Social Worker      DC Planning Services  NA      [facility] Choice  NA   Choice offered to / List presented to:  NA   DME arranged  NA      DME agency  NA     HH arranged  NA      HH agency  NA   Status of service:  In process, will continue to follow Medicare Important Message given?  NA - LOS <3 / Initial given by admissions (If response is "NO", the following Medicare IM given date fields will be blank) Date Medicare IM given:   Date Additional Medicare IM given:    Discharge Disposition:    Per UR Regulation:  Reviewed for med. necessity/level of care/duration of stay  If discussed at Long Length of Stay Meetings, dates discussed:    Comments:  [id number] Earlene Plater, RN, BSN, CCM:  CHART REVIEWED AND UPDATED.  Next chart review due on [id number]. NO DISCHARGE NEEDS PRESENT AT THIS TIME. CASE MANAGEMENT [phone]

## 2012-08-25 NOTE — Progress Notes (Signed)
ANTIBIOTIC CONSULT NOTE - INITIAL  Pharmacy Consult for Vancomycin Indication: LLE Cellulitis  Allergies  Allergen Reactions  . Olmesartan Medoxomil Cough    REACTION: ? if cough  . Tetracycline Hcl     Unknown reaction, too long for patient to remember   . Venlafaxine     REACTION: severe dry moouth  . Adhesive (Tape) Rash    Patient Measurements: Height: 5\' 4"  (162.6 cm) Weight: 213 lb 13.5 oz (97 kg) IBW/kg (Calculated) : 54.7  Vital Signs: Temp: 98.1 F (36.7 C) (04/16 0800) Temp src: Axillary (04/16 0800) BP: 104/64 mmHg (04/16 0900) Pulse Rate: 88 (04/16 0900) Intake/Output from previous day: 04/15 0701 - 04/16 0700 In: 1364.8 [P.O.:720; I.V.:494.8; IV Piggyback:150] Out: 5275 [Urine:5275] Intake/Output from this shift:    Labs:  Recent Labs  08/24/12 1225 08/25/12 0400  WBC 8.4 7.5  HGB 13.1 12.5  PLT 136* 112*  CREATININE 0.72 0.80   Estimated Creatinine Clearance: 86.6 ml/min (by C-G formula based on Cr of 0.8). No results found for this basename: VANCOTROUGH, Leodis Binet, VANCORANDOM, GENTTROUGH, GENTPEAK, GENTRANDOM, TOBRATROUGH, TOBRAPEAK, TOBRARND, AMIKACINPEAK, AMIKACINTROU, AMIKACIN,  in the last 72 hours   Microbiology: Recent Results (from the past 720 hour(s))  CLOSTRIDIUM DIFFICILE BY PCR     Status: None   Collection Time    08/04/12  9:03 AM      Result Value Range Status   C difficile by pcr NEGATIVE  NEGATIVE Final  URINE CULTURE     Status: None   Collection Time    08/05/12  3:45 PM      Result Value Range Status   Specimen Description URINE, CATHETERIZED   Final   Special Requests Immunocompromised   Final   Culture  Setup Time 08/06/2012 01:21   Final   Colony Count >=100,000 COLONIES/ML   Final   Culture     Final   Value: ESCHERICHIA COLI     KLEBSIELLA PNEUMONIAE   Report Status 08/08/2012 FINAL   Final   Organism ID, Bacteria ESCHERICHIA COLI   Final   Organism ID, Bacteria KLEBSIELLA PNEUMONIAE   Final  CULTURE, BLOOD  (ROUTINE X 2)     Status: None   Collection Time    08/05/12  4:00 PM      Result Value Range Status   Specimen Description BLOOD LEFT HAND   Final   Special Requests BOTTLES DRAWN AEROBIC AND ANAEROBIC 10CC   Final   Culture  Setup Time 08/05/2012 21:54   Final   Culture NO GROWTH 5 DAYS   Final   Report Status 08/11/2012 FINAL   Final  CULTURE, BLOOD (ROUTINE X 2)     Status: None   Collection Time    08/05/12  4:03 PM      Result Value Range Status   Specimen Description BLOOD LEFT ARM   Final   Special Requests BOTTLES DRAWN AEROBIC AND ANAEROBIC 10CC   Final   Culture  Setup Time 08/05/2012 21:54   Final   Culture NO GROWTH 5 DAYS   Final   Report Status 08/11/2012 FINAL   Final  MRSA PCR SCREENING     Status: None   Collection Time    08/07/12  3:14 PM      Result Value Range Status   MRSA by PCR NEGATIVE  NEGATIVE Final   Comment:            The GeneXpert MRSA Assay (FDA     approved for NASAL specimens  only), is one component of a     comprehensive MRSA colonization     surveillance program. It is not     intended to diagnose MRSA     infection nor to guide or     monitor treatment for     MRSA infections.  MRSA PCR SCREENING     Status: None   Collection Time    08/24/12  4:00 PM      Result Value Range Status   MRSA by PCR NEGATIVE  NEGATIVE Final   Comment:            The GeneXpert MRSA Assay (FDA     approved for NASAL specimens     only), is one component of a     comprehensive MRSA colonization     surveillance program. It is not     intended to diagnose MRSA     infection nor to guide or     monitor treatment for     MRSA infections.    Medical History: Past Medical History  Diagnosis Date  . Depression   . DVT (deep venous thrombosis)     hx of on HRT left leg ~2006  . GERD (gastroesophageal reflux disease)   . Hyperlipidemia   . Hypertension   . Hypothyroidism   . PPD positive, treated     rx inh   . OSA on CPAP   . Diabetes mellitus    . 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 Breast cancer     a. 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  Pt initially started on ceftriaxone and now pharmacy asked to dose vancomycin.    Wt = 97kg, Height = 5'4"  Renal: SCr stable, CrCl 87 ml/min, N 87  Afebrile  WBC WNL  Cultures: Urine collected 4/15, MRSA PCR negative  Vancomycin trough 3/30 = 14.5 on 1 gram IV q 12 hours  Goal of Therapy:  Vancomycin trough level 10-15 mcg/ml  Plan:  1.  Vancomycin 2000mg  IV x 1, then 1000mg  IV q 12 hours 2.  F/u Vancomycin trough at steady state.   3.  F/u renal fxn, cultures, WBC, T, clinical course  Diamante Truszkowski E 08/25/2012,10:00 AM

## 2012-08-25 NOTE — Progress Notes (Signed)
Support for patient and spouse. They have been enjoying being home since the last stay here at [facility]. This return is a bit disappointing and scary to them. Listening; presence; prayer.

## 2012-08-26 ENCOUNTER — Encounter (HOSPITAL_COMMUNITY): Payer: Self-pay | Admitting: Radiology

## 2012-08-26 ENCOUNTER — Inpatient Hospital Stay (HOSPITAL_COMMUNITY): Payer: 59

## 2012-08-26 LAB — PROTIME-INR
INR: 2.39 — ABNORMAL HIGH (ref 0.00–1.49)
Prothrombin Time: 25 seconds — ABNORMAL HIGH (ref 11.6–15.2)

## 2012-08-26 LAB — CBC WITH DIFFERENTIAL/PLATELET
Basophils Relative: 0 % (ref 0–1)
Eosinophils Relative: 0 % (ref 0–5)
Hemoglobin: 12.1 g/dL (ref 12.0–15.0)
MCH: 27.1 pg (ref 26.0–34.0)
Monocytes Absolute: 0.7 10*3/uL (ref 0.1–1.0)
Neutrophils Relative %: 75 % (ref 43–77)
RBC: 4.46 MIL/uL (ref 3.87–5.11)

## 2012-08-26 LAB — COMPREHENSIVE METABOLIC PANEL
AST: 41 U/L — ABNORMAL HIGH (ref 0–37)
Albumin: 1.6 g/dL — ABNORMAL LOW (ref 3.5–5.2)
CO2: 26 mEq/L (ref 19–32)
Calcium: 7.4 mg/dL — ABNORMAL LOW (ref 8.4–10.5)
Creatinine, Ser: 0.8 mg/dL (ref 0.50–1.10)
GFR calc non Af Amer: 80 mL/min — ABNORMAL LOW (ref >90)

## 2012-08-26 LAB — PRO B NATRIURETIC PEPTIDE: Pro B Natriuretic peptide (BNP): 1184 pg/mL — ABNORMAL HIGH (ref 0–125)

## 2012-08-26 LAB — GLUCOSE, CAPILLARY: Glucose-Capillary: 116 mg/dL — ABNORMAL HIGH (ref 70–99)

## 2012-08-26 MED ORDER — POTASSIUM CHLORIDE CRYS ER 20 MEQ PO TBCR
40.0000 meq | EXTENDED_RELEASE_TABLET | ORAL | Status: AC
  Administered 2012-08-26 (×3): 40 meq via ORAL
  Filled 2012-08-26 (×2): qty 2
  Filled 2012-08-26 (×2): qty 1
  Filled 2012-08-26: qty 2

## 2012-08-26 MED ORDER — PHYTONADIONE 5 MG PO TABS
7.5000 mg | ORAL_TABLET | Freq: Once | ORAL | Status: AC
  Administered 2012-08-26: 7.5 mg via ORAL
  Filled 2012-08-26: qty 2

## 2012-08-26 MED ORDER — METOPROLOL TARTRATE 1 MG/ML IV SOLN
5.0000 mg | INTRAVENOUS | Status: AC | PRN
  Administered 2012-08-26 (×3): 5 mg via INTRAVENOUS

## 2012-08-26 MED ORDER — POTASSIUM CHLORIDE CRYS ER 20 MEQ PO TBCR: 40.0000 meq | EXTENDED_RELEASE_TABLET | ORAL | Status: DC

## 2012-08-26 MED ORDER — METOPROLOL TARTRATE 25 MG PO TABS
25.0000 mg | ORAL_TABLET | Freq: Two times a day (BID) | ORAL | Status: DC
  Administered 2012-08-26 (×2): 25 mg via ORAL
  Filled 2012-08-26 (×4): qty 1

## 2012-08-26 NOTE — H&P (Signed)
Agree 

## 2012-08-26 NOTE — Progress Notes (Signed)
Patient alert and oriented.  Patient on cardizem drip, and heart rate elevated.  3 doses of 5mg  IV metoprolol were given, per Dr. Elease Hashimoto.  First dose given at 0842, second dose given at 0854 and third dose given at 0924.  Scheduled PO metoprolol given at 1001.  At 1100, patient still in atrial fibrillation with a heart rate of 85-94.  BP remained stable with medication administration.

## 2012-08-26 NOTE — Progress Notes (Signed)
[name] has been active with [employer] as a benefit of being a [employer] employee with [employer]. She has been followed by the pharmacist in the recent past for her Diabetes Management. However, as of late, she has had frequent recent hospitalizations due to other medical issues. Will continue to follow. Noted she is active with [facility] services.   Raiford Noble, MSN- Ed, RN,BSN, [facility], [phone]

## 2012-08-26 NOTE — Evaluation (Addendum)
Occupational Therapy Evaluation Patient Details Name: [name] MRN: [id number] DOB: 1956-03-09 Today's Date: 08/26/2012 Time: 1133-1206 OT Time Calculation (min): 33 min  OT Assessment / Plan / Recommendation Clinical Impression   This 57 y.o. Female with recent admission and h/o Rt. Breast CA invasive ductal carcinoma, s/p mastectomy 06/08/12 (Rt).  Admitted with worsening LE edema and increased HR.  Pt will benefit from OT for the below listed deficits,  to maximize safety and independence with BADLs to allow her to return home with family at mod A level.  Family is very supportive and plan is for pt to return home at discharge.  Pt. Is significantly deconditioned.    OT Assessment  Patient needs continued OT Services    Follow Up Recommendations  Home health OT;Supervision/Assistance - 24 hour    Barriers to Discharge None    Equipment Recommendations  3 in 1 bedside comode;Tub/shower bench    Recommendations for Other Services    Frequency  Min 2X/week    Precautions / Restrictions Precautions Precautions: Fall Restrictions Weight Bearing Restrictions: No   Pertinent Vitals/Pain  HR 92-108 with on very brief increase to 121.  BP 100/66 at beginning of session and 98/73 at end of session.   ADL  Eating/Feeding: Set up Where Assessed - Eating/Feeding: Bed level;Chair Grooming: Wash/dry hands;Wash/dry face;Teeth care;Supervision/safety Where Assessed - Grooming: Unsupported sitting Upper Body Bathing: Moderate assistance Where Assessed - Upper Body Bathing: Supported sitting Lower Body Bathing: Maximal assistance Where Assessed - Lower Body Bathing: Supported sit to stand Upper Body Dressing: Maximal assistance Where Assessed - Upper Body Dressing: Unsupported sitting Lower Body Dressing: +1 Total assistance Where Assessed - Lower Body Dressing: Supported sit to Pharmacist, hospital: +2 Total assistance Toilet Transfer: Patient Percentage: 70% Statistician  Method: Surveyor, minerals: Materials engineer and Hygiene: +2 Total assistance Toileting - Architect and Hygiene: Patient Percentage: 0% Where Assessed - Engineer, mining and Hygiene: Sit to stand from 3-in-1 or toilet Equipment Used: Gait belt;Rolling walker Transfers/Ambulation Related to ADLs: Pt only able to transfer bed to recliner with total A +2 (pt 70%) ADL Comments: Pt. fatigues rapidly.  Low endurance    OT Diagnosis: Generalized weakness  OT Problem List: Decreased strength;Decreased activity tolerance;Impaired balance (sitting and/or standing);Decreased knowledge of use of DME or AE;Increased edema OT Treatment Interventions: Self-care/ADL training;Energy conservation;DME and/or AE instruction;Therapeutic activities;Patient/family education;Balance training   OT Goals Acute Rehab OT Goals OT Goal Formulation: With patient/family Time For Goal Achievement: 09/09/12 Potential to Achieve Goals: Good ADL Goals Pt Will Perform Grooming: with min assist;Standing at sink ADL Goal: Grooming - Progress: Goal set today Pt Will Perform Upper Body Bathing: with min assist;Sitting, chair ADL Goal: Upper Body Bathing - Progress: Goal set today Pt Will Perform Lower Body Bathing: with mod assist;Sit to stand from bed;Sit to stand from chair ADL Goal: Lower Body Bathing - Progress: Goal set today Pt Will Perform Lower Body Dressing: with mod assist;Sit to stand from chair;Sit to stand from bed;with adaptive equipment ADL Goal: Lower Body Dressing - Progress: Goal set today Pt Will Transfer to Toilet: with min assist;Ambulation;3-in-1;Comfort height toilet ADL Goal: Toilet Transfer - Progress: Goal set today Pt Will Perform Toileting - Clothing Manipulation: with min assist;Standing ADL Goal: Toileting - Clothing Manipulation - Progress: Goal set today Pt Will Perform Toileting - Hygiene: with min assist;Sit to  stand from 3-in-1/toilet ADL Goal: Toileting - Hygiene - Progress: Goal set today Additional  ADL Goal #1: Pt will particiapte in 25 mins therapeutic activity with no more than 2 rest breaks to increase activity tolerance ADL Goal: Additional Goal #1 - Progress: Goal set today Miscellaneous OT Goals OT Goal: Miscellaneous Goal #2 - Progress: Discontinued (comment)  Visit Information  Last OT Received On: 08/26/12 Assistance Needed: +2    Subjective Data  Subjective: "I'm scared" of standing Patient Stated Goal: To get stronger   Prior Functioning     Home Living Lives With: Spouse;Family Available Help at Discharge: Family;Available 24 hours/day Type of Home: House Home Access: Stairs to enter [employer] of Steps: 3 Entrance Stairs-Rails: None Home Layout: One level Bathroom Shower/Tub: Forensic scientist: Standard Home Adaptive Equipment: Walker - rolling;Shower chair without back Prior Function Level of Independence: Needs assistance Needs Assistance: Bathing;Dressing;Toileting;Meal Prep;Light Housekeeping Bath: Moderate Dressing: Maximal Toileting: Moderate Meal Prep: Total Light Housekeeping: Total Able to Take Stairs?: No Driving: No Comments: Pt with recent hospitalization, then discharged to SNF for two days before family took her home. Communication Communication: No difficulties         Vision/Perception     Cognition  Cognition Arousal/Alertness: Awake/alert Behavior During Therapy: Anxious General Comments: Pt slow to respond    Extremity/Trunk Assessment Right Upper Extremity Assessment RUE ROM/Strength/Tone: Deficits RUE ROM/Strength/Tone Deficits: strength grossly 4/5 RUE Coordination: WFL - gross/fine motor Left Upper Extremity Assessment LUE ROM/Strength/Tone: Deficits LUE ROM/Strength/Tone Deficits: strength grossly 4/5 LUE Coordination: WFL - gross/fine motor     Mobility Bed Mobility Bed Mobility:  Supine to Sit;Sitting - Scoot to Edge of Bed Supine to Sit: 1: +2 Total assist;HOB flat;With rails Supine to Sit: Patient Percentage: 50% Sitting - Scoot to Edge of Bed: 3: Mod assist Details for Bed Mobility Assistance: vc's for technique.  Pt. requires assist for LEs and to lift shoulders from bed Transfers Transfers: Sit to Stand;Stand to Sit Sit to Stand: From elevated surface;1: +2 Total assist;From bed;With upper extremity assist Sit to Stand: Patient Percentage: 70% Stand to Sit: 1: +2 Total assist;To chair/3-in-1;To bed;With upper extremity assist Stand to Sit: Patient Percentage: 70% Details for Transfer Assistance: vc's for hand placement.  Pt. with uncontrolled descent when sitting     Exercise     Balance Balance Balance Assessed: Yes Static Sitting Balance Static Sitting - Balance Support: No upper extremity supported Static Sitting - Level of Assistance: 5: Stand by assistance   End of Session OT - End of Session Equipment Utilized During Treatment: Gait belt Activity Tolerance: Patient limited by fatigue;Other (comment) (fear of falling) Patient left: in chair;with call bell/phone within reach;with family/visitor present Nurse Communication: Mobility status  GO     Jeani Hawking M 08/26/2012, 12:59 PM

## 2012-08-26 NOTE — H&P (Signed)
HPI: [name] is an 57 y.o. female with hx of breast cancer and failure to thrive, particularly for the past few months. She has developed severe protein calorie malnutrition and hypoalbuminemia, which has resulted in fluid overload and anasarca. She has not been able to tolerate foods of any kind as a side effect of her chemotherapy. She can swallow but has not been able to sustain herself on oral intake. She is at the point where she needs alternative means of nutrition to provide adequate caloric and protein intake. The goal is to improve her nutritonal status so that her edema will resolve, in the hopes that he appetite will cont to improve, and eventually the G-tube can be removed. She is also being treated for afib and is currently on heparin drip. She was given one dose of Eliquis a few days ago. Her INR has been elevated likely from liver dysfunction as she has not been on Coumadin. She has received Vit K to help return her INR to normal. The pt and her supportive husband are in agreance with this plan.  Chart, meds, PMHx reviewed.  Past Medical History:  Past Medical History  Diagnosis Date  . Depression   . DVT (deep venous thrombosis)     hx of on HRT left leg ~2006  . GERD (gastroesophageal reflux disease)   . Hyperlipidemia   . Hypertension   . Hypothyroidism   . PPD positive, treated     rx inh   . OSA on CPAP   . Diabetes mellitus   . 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 Breast cancer     a. 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 Tubal ligation    . Cholecystectomy    . Foot surgery    . Eye surgery    . Cataract extraction    . Percutaneous liver biopsy    . Breast biopsy      left breast  . Mastectomy modified radical  06/08/2012    Procedure: MASTECTOMY  MODIFIED RADICAL;  Surgeon: Mariella Saa, MD;  Location: [facility] OR;  Service: General;  Laterality: Right;  . Portacath placement  06/08/2012    Procedure: INSERTION PORT-A-CATH;  Surgeon: Mariella Saa, MD;  Location: [facility] OR;  Service: General;  Laterality: Left;    Family History:  Family History  Problem Relation Age of Onset  . Diabetes Mother   . Hypertension Mother   . Arthritis Mother   . Heart disease Mother   . Heart failure Mother   . Other Mother     benign breast mass  . Stroke Father   . Heart disease Father   . Diabetes Paternal Grandmother   . Colon cancer Paternal Grandfather     Social History:  reports that she has quit smoking. She has never used smokeless tobacco. She reports that she does not drink alcohol or use illicit drugs.  Allergies:  Allergies  Allergen Reactions  . Olmesartan Medoxomil Cough    REACTION: ? if cough  . Tetracycline Hcl     Unknown reaction, too long for patient to remember   . Venlafaxine     REACTION: severe dry moouth  . Adhesive (Tape) Rash    Medications: Medications Prior to Admission  Medication Sig Dispense Refill  .  antiseptic oral rinse (BIOTENE) LIQD 15 mLs by Mouth Rinse route 2 (two) times daily as needed.  100 mL  3  . aspirin EC 81 MG tablet Take 81 mg by mouth every morning.      [name] atorvastatin (LIPITOR) 10 MG tablet Take 10 mg by mouth at bedtime.   90 tablet  1  . buPROPion (WELLBUTRIN SR) 150 MG 12 hr tablet Take 150 mg by mouth every morning.       . cholestyramine (QUESTRAN) 4 G packet Take 1 packet (4 g total) by mouth 2 (two) times daily.  60 each  3  . diltiazem (CARDIZEM CD) 120 MG 24 hr capsule Take 120 mg by mouth every morning.      . enoxaparin (LOVENOX) 40 MG/0.4ML injection Inject 0.4 mLs (40 mg total) into the skin daily.  14 Syringe  1  . feeding supplement (RESOURCE BREEZE) LIQD Take 1 Container by mouth 2 (two) times daily between meals.  60 Container  3  . insulin aspart (NOVOLOG) 100  UNIT/ML injection Inject 0-15 Units into the skin 3 (three) times daily with meals.  1 vial  3  . letrozole (FEMARA) 2.5 MG tablet Take 1 tablet (2.5 mg total) by mouth daily.  30 tablet  3  . levothyroxine (SYNTHROID, LEVOTHROID) 125 MCG tablet Take 125 mcg by mouth daily before breakfast.      . lidocaine-prilocaine (EMLA) cream Apply 1 application topically as needed (for port access).      . metoprolol succinate (TOPROL-XL) 25 MG 24 hr tablet Take 25 mg by mouth daily. Take with or immediately following a meal.      . Multiple Vitamin (MULTIVITAMIN WITH MINERALS) TABS Take 1 tablet by mouth daily.  30 tablet  3  . omeprazole (PRILOSEC) 20 MG capsule Take 1 capsule (20 mg total) by mouth 2 (two) times daily.  180 capsule  2  . ondansetron (ZOFRAN ODT) 4 MG disintegrating tablet Take 1 tablet (4 mg total) by mouth every 8 (eight) hours as needed for nausea.  20 tablet  0  . oxyCODONE-acetaminophen (PERCOCET/ROXICET) 5-325 MG per tablet Take 1-2 tablets by mouth every 4 (four) hours as needed for pain.  20 tablet  0  . saccharomyces boulardii (FLORASTOR) 250 MG capsule Take 1 capsule (250 mg total) by mouth 2 (two) times daily.  60 capsule  1  . sodium chloride 0.9 % injection 10-40 mLs by Intracatheter route as needed (flush).  5 mL  3  . sodium chloride 0.9 % SOLN 1,000 mL with potassium chloride 2 mEq/ml SOLN 10 mEq Inject 75 mL/hr into the vein continuous.  30 L  3    Please HPI for pertinent positives, otherwise complete 10 system ROS negative.  Physical Exam: Blood pressure 98/73, pulse 102, temperature 97.5 F (36.4 C), temperature source Oral, resp. rate 21, height 5\' 4"  (1.626 m), weight 213 lb 13.5 oz (97 kg), SpO2 93.00%. Body mass index is 36.69 kg/(m^2).   General Appearance:  Alert, cooperative, no distress, appears stated age  Head:  Normocephalic, without obvious abnormality, atraumatic  ENT: Unremarkable  Neck: Supple, symmetrical, trachea midline,   Lungs:   Clear to  auscultation bilaterally, no w/r/r, respirations unlabored without use of accessory muscles.  Chest Wall:  No tenderness or deformity  Heart:  Irregular rate and rhythm, S1, S2 normal, no murmur, rub or gallop.   Abdomen:   Soft, non-tender, non distended. Anasarca of lower abd, but not upper, no fluid wave  to suggest ascites.  Extremities: 3-4+ pitting edema, but softer than yesterday  Neurologic: Normal affect, no gross deficits.   Results for orders placed during the hospital encounter of 08/24/12 (from the past 48 hour(s))  MRSA PCR SCREENING     Status: None   Collection Time    08/24/12  4:00 PM      Result Value Range   MRSA by PCR NEGATIVE  NEGATIVE   Comment:            The GeneXpert MRSA Assay (FDA     approved for NASAL specimens     only), is one component of a     comprehensive MRSA colonization     surveillance program. It is not     intended to diagnose MRSA     infection nor to guide or     monitor treatment for     MRSA infections.  GLUCOSE, CAPILLARY     Status: None   Collection Time    08/24/12  4:34 PM      Result Value Range   Glucose-Capillary 94  70 - 99 mg/dL  TROPONIN I     Status: None   Collection Time    08/24/12  4:40 PM      Result Value Range   Troponin I <0.30  <0.30 ng/mL   Comment:            Due to the release kinetics of cTnI,     a negative result within the first hours     of the onset of symptoms does not rule out     myocardial infarction with certainty.     If myocardial infarction is still suspected,     repeat the test at appropriate intervals.  GLUCOSE, CAPILLARY     Status: Abnormal   Collection Time    08/24/12  9:35 PM      Result Value Range   Glucose-Capillary 101 (*) 70 - 99 mg/dL   Comment 1 Documented in Chart     Comment 2 Notify RN    TROPONIN I     Status: None   Collection Time    08/24/12 10:00 PM      Result Value Range   Troponin I <0.30  <0.30 ng/mL   Comment:            Due to the release kinetics of  cTnI,     a negative result within the first hours     of the onset of symptoms does not rule out     myocardial infarction with certainty.     If myocardial infarction is still suspected,     repeat the test at appropriate intervals.  TROPONIN I     Status: None   Collection Time    08/25/12  4:00 AM      Result Value Range   Troponin I <0.30  <0.30 ng/mL   Comment:            Due to the release kinetics of cTnI,     a negative result within the first hours     of the onset of symptoms does not rule out     myocardial infarction with certainty.     If myocardial infarction is still suspected,     repeat the test at appropriate intervals.  BASIC METABOLIC PANEL     Status: Abnormal   Collection Time    08/25/12  4:00 AM  Result Value Range   Sodium 136  135 - 145 mEq/L   Potassium 2.7 (*) 3.5 - 5.1 mEq/L   Comment: REPEATED TO VERIFY     DELTA CHECK NOTED     CRITICAL RESULT CALLED TO, READ BACK BY AND VERIFIED WITH:     AYERS,K. RN @ 4:54 AM ON  08/25/12, NONAN,J.   Chloride 100  96 - 112 mEq/L   CO2 25  19 - 32 mEq/L   Glucose, Bld 108 (*) 70 - 99 mg/dL   BUN 11  6 - 23 mg/dL   Creatinine, Ser 1.61  0.50 - 1.10 mg/dL   Calcium 7.2 (*) 8.4 - 10.5 mg/dL   GFR calc non Af Amer 80 (*) >90 mL/min   GFR calc Af Amer >90  >90 mL/min   Comment:            The eGFR has been calculated     using the CKD EPI equation.     This calculation has not been     validated in all clinical     situations.     eGFR's persistently     <90 mL/min signify     possible Chronic Kidney Disease.  CBC     Status: Abnormal   Collection Time    08/25/12  4:00 AM      Result Value Range   WBC 7.5  4.0 - 10.5 K/uL   RBC 4.52  3.87 - 5.11 MIL/uL   Hemoglobin 12.5  12.0 - 15.0 g/dL   HCT 09.6  04.5 - 40.9 %   MCV 81.4  78.0 - 100.0 fL   MCH 27.7  26.0 - 34.0 pg   MCHC 34.0  30.0 - 36.0 g/dL   RDW 81.1 (*) 91.4 - 78.2 %   Platelets 112 (*) 150 - 400 K/uL   Comment: SPECIMEN CHECKED FOR  CLOTS     REPEATED TO VERIFY     PLATELET COUNT CONFIRMED BY SMEAR  PRO B NATRIURETIC PEPTIDE     Status: Abnormal   Collection Time    08/25/12  4:00 AM      Result Value Range   Pro B Natriuretic peptide (BNP) 1915.0 (*) 0 - 125 pg/mL  MAGNESIUM     Status: None   Collection Time    08/25/12  4:00 AM      Result Value Range   Magnesium 1.6  1.5 - 2.5 mg/dL  GLUCOSE, CAPILLARY     Status: Abnormal   Collection Time    08/25/12  7:52 AM      Result Value Range   Glucose-Capillary 100 (*) 70 - 99 mg/dL  GLUCOSE, CAPILLARY     Status: Abnormal   Collection Time    08/25/12 11:44 AM      Result Value Range   Glucose-Capillary 104 (*) 70 - 99 mg/dL  BASIC METABOLIC PANEL     Status: Abnormal   Collection Time    08/25/12  2:30 PM      Result Value Range   Sodium 132 (*) 135 - 145 mEq/L   Potassium 3.0 (*) 3.5 - 5.1 mEq/L   Chloride 97  96 - 112 mEq/L   CO2 25  19 - 32 mEq/L   Glucose, Bld 116 (*) 70 - 99 mg/dL   BUN 12  6 - 23 mg/dL   Creatinine, Ser 9.56  0.50 - 1.10 mg/dL   Calcium 7.3 (*) 8.4 - 10.5 mg/dL   GFR calc  non Af Amer 79 (*) >90 mL/min   GFR calc Af Amer >90  >90 mL/min   Comment:            The eGFR has been calculated     using the CKD EPI equation.     This calculation has not been     validated in all clinical     situations.     eGFR's persistently     <90 mL/min signify     possible Chronic Kidney Disease.  PROTIME-INR     Status: Abnormal   Collection Time    08/25/12  2:30 PM      Result Value Range   Prothrombin Time 29.4 (*) 11.6 - 15.2 seconds   INR 2.98 (*) 0.00 - 1.49  GLUCOSE, CAPILLARY     Status: Abnormal   Collection Time    08/25/12  6:11 PM      Result Value Range   Glucose-Capillary 118 (*) 70 - 99 mg/dL   Comment 1 Documented in Chart     Comment 2 Notify RN    GLUCOSE, CAPILLARY     Status: Abnormal   Collection Time    08/25/12 10:14 PM      Result Value Range   Glucose-Capillary 107 (*) 70 - 99 mg/dL  COMPREHENSIVE  METABOLIC PANEL     Status: Abnormal   Collection Time    08/26/12  5:00 AM      Result Value Range   Sodium 134 (*) 135 - 145 mEq/L   Potassium 3.2 (*) 3.5 - 5.1 mEq/L   Chloride 98  96 - 112 mEq/L   CO2 26  19 - 32 mEq/L   Glucose, Bld 114 (*) 70 - 99 mg/dL   BUN 13  6 - 23 mg/dL   Creatinine, Ser 1.61  0.50 - 1.10 mg/dL   Calcium 7.4 (*) 8.4 - 10.5 mg/dL   Total Protein 5.2 (*) 6.0 - 8.3 g/dL   Albumin 1.6 (*) 3.5 - 5.2 g/dL   AST 41 (*) 0 - 37 U/L   ALT 39 (*) 0 - 35 U/L   Alkaline Phosphatase 220 (*) 39 - 117 U/L   Total Bilirubin 0.5  0.3 - 1.2 mg/dL   GFR calc non Af Amer 80 (*) >90 mL/min   GFR calc Af Amer >90  >90 mL/min   Comment:            The eGFR has been calculated     using the CKD EPI equation.     This calculation has not been     validated in all clinical     situations.     eGFR's persistently     <90 mL/min signify     possible Chronic Kidney Disease.  CBC WITH DIFFERENTIAL     Status: Abnormal   Collection Time    08/26/12  5:00 AM      Result Value Range   WBC 7.9  4.0 - 10.5 K/uL   RBC 4.46  3.87 - 5.11 MIL/uL   Hemoglobin 12.1  12.0 - 15.0 g/dL   HCT 09.6  04.5 - 40.9 %   MCV 80.9  78.0 - 100.0 fL   MCH 27.1  26.0 - 34.0 pg   MCHC 33.5  30.0 - 36.0 g/dL   RDW 81.1 (*) 91.4 - 78.2 %   Platelets 120 (*) 150 - 400 K/uL   Neutrophils Relative 75  43 - 77 %   Lymphocytes Relative  16  12 - 46 %   Monocytes Relative 9  3 - 12 %   Eosinophils Relative 0  0 - 5 %   Basophils Relative 0  0 - 1 %   Neutro Abs 5.9  1.7 - 7.7 K/uL   Lymphs Abs 1.3  0.7 - 4.0 K/uL   Monocytes Absolute 0.7  0.1 - 1.0 K/uL   Eosinophils Absolute 0.0  0.0 - 0.7 K/uL   Basophils Absolute 0.0  0.0 - 0.1 K/uL   RBC Morphology OVALOCYTES     Comment: BURR CELLS  PRO B NATRIURETIC PEPTIDE     Status: Abnormal   Collection Time    08/26/12  5:00 AM      Result Value Range   Pro B Natriuretic peptide (BNP) 1184.0 (*) 0 - 125 pg/mL  PROTIME-INR     Status: Abnormal    Collection Time    08/26/12  5:00 AM      Result Value Range   Prothrombin Time 25.0 (*) 11.6 - 15.2 seconds   INR 2.39 (*) 0.00 - 1.49  GLUCOSE, CAPILLARY     Status: Abnormal   Collection Time    08/26/12  7:41 AM      Result Value Range   Glucose-Capillary 116 (*) 70 - 99 mg/dL  GLUCOSE, CAPILLARY     Status: Abnormal   Collection Time    08/26/12 11:48 AM      Result Value Range   Glucose-Capillary 140 (*) 70 - 99 mg/dL   Dg Chest Port 1 View  08/26/2012  *RADIOLOGY REPORT*  Clinical Data: Follow up pulmonary edema  PORTABLE CHEST - 1 VIEW  Comparison: Prior chest x-ray 08/25/2012  Findings: Stable position of left subclavian approach portacatheter.  The catheter tip is at the superior cavoatrial junction.  No significant interval change in the appearance of the chest with persistent pulmonary vascular congestion, mild edema and bibasilar pleural effusions with associated atelectasis.  Surgical changes of prior mastectomy and axillary node dissection are similar.  Stable cardiomegaly.  No pneumothorax.  No acute osseous abnormality.  IMPRESSION: No significant interval change. Stable CHF versus volume overload.   Original Report Authenticated By: Malachy Moan, M.D.    Portable Chest 1 View  08/25/2012  *RADIOLOGY REPORT*  Clinical Data: Atrial fibrillation, pleural effusion.  PORTABLE CHEST - 1 VIEW  Comparison: 08/24/2012 and PET 07/05/2012.  Findings: Trachea is midline.  Heart size stable.  Left subclavian Port-A-Cath tip projects over the SVC.  Mild diffuse bilateral air space disease, bibasilar predominant, with slight worsening from 08/24/2012.  Bilateral pleural effusions. Surgical clips in the right axillary region with changes of right mastectomy.  IMPRESSION: Favor worsening congestive heart failure.   Original Report Authenticated By: Leanna Battles, M.D.     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 Thoroughly discussed indications  and use of G-tube in this setting.  Explained radiologic approach to G-tube placement, including use of contrast, risks, complications, and benefits of G-tube. Discussed use of sedation. Currently INR down to 2.39 and pt received another dose of Vit K today. Will check level in am, if still elevated, will need FFP prior to procedure. Will order barium to be drunk tonight, check KUB in am to see status of contrast progression.  Consent signed in chart  Brayton El PA-C 08/26/2012, 1:32 PM

## 2012-08-26 NOTE — Progress Notes (Signed)
Spoke with pt, states waiting on last of oral medications before readiness for CPAP placement. Will self administer when ready. Checked humidification level, machine at bedside. RT will assist as needed.

## 2012-08-26 NOTE — Progress Notes (Signed)
PROGRESS NOTE  Subjective:   Angelyna is a 57 yo with  female with history of breast cancer right invasive ductal carcinoma with 2/18 lymph nodes involved status post right mastectomy 06/08/2012 was on chemotherapy, history of DVT of the left leg in 2006, gastroesophageal reflux disease, hyperlipidemia, hypertension, atrial fibrillation, hypothyroidism, diabetes mellitus, chronic liver disease with cirrhosis who was recently discharged from the hospital on 08/17/2012 for electrolyte abnormalities, malnutrition, severe deconditioning and atrial fibrillation. Patient is presenting to the ED with worsening lower extremity edema. Patient was discharged from the hospital to a skilled nursing facility however however after 2-3 days at the facility patient and family were not happy with the services received at the skilled nursing facility stating that patient was left in the wheelchair all day and also patient's medications were not being given  the way they were supposed to and a such patient went home with home health services.  Home health nurse today per family daughter that patient had worsening lower extremity edema she stated that the patient sounded congested on exam and subsequently patient was sent to the emergency room. Patient denies any fever, no chills, no nausea, no vomiting, no chest pain, no shortness of breath, no abdominal pain, no diarrhea, no constipation, no dysuria, no generalized weakness.  Patient was seen in the emergency room was noted to have a heart rate in the 150s and subsequently placed on a Cardizem drip after being given a dose of IV metoprolol. Patient also noted on exam to have lower extremity edema per ED physician and 40 of Lasix were given. Chest x-ray which was done did show some infiltrates concerning for atelectasis versus infiltrate. CBC done had a normal white count. Comprehensive metabolic profile did have a sodium of 134 elevated alk phosphatase of 242 albumin of 1.6  AST of 50 ALT of 41 protein of 5.5 otherwise was within normal limits. EKG showed atrial fibrillation with RVR. Urinalysis done was nitrite positive leukocytes large WBCs with too numerous to count.   Objective:    Vital Signs:   Temp:  [97.3 F (36.3 C)-98.2 F (36.8 C)] 97.8 F (36.6 C) (04/17 0400) Pulse Rate:  [50-146] 98 (04/17 0700) Resp:  [7-22] 20 (04/17 0700) BP: (92-136)/(46-78) 136/67 mmHg (04/17 0700) SpO2:  [93 %-98 %] 95 % (04/17 0700)  Last BM Date: 08/24/12   24-hour weight change: Weight change:   Weight trends: Filed Weights   08/24/12 1600  Weight: 213 lb 13.5 oz (97 kg)    Intake/Output:  04/16 0701 - 04/17 0700 In: 1630.4 [I.V.:574.4; IV Piggyback:1056] Out: 2155 [Urine:2155]     Physical Exam: BP 136/67  Pulse 98  Temp(Src) 97.8 F (36.6 C) (Axillary)  Resp 20  Ht 5\' 4"  (1.626 m)  Wt 213 lb 13.5 oz (97 kg)  BMI 36.69 kg/m2  SpO2 95%  General: Vital signs reviewed and noted. She is chronically ill appearing.   Head: Normocephalic, atraumatic.  Eyes: conjunctivae/corneas clear.  EOM's intact.   Throat: normal  Neck:  no JVD  Lungs:   clear  Heart:  Irreg. Irreg.  Abdomen:  Soft, non-tender, non-distended    Extremities: 3+ soft pitting edema   Neurologic: A&O X3, CN II - XII are grossly intact.   Psych: Normal     Labs: BMET:  Recent Labs  08/24/12 1225 08/25/12 0400 08/25/12 1430 08/26/12 0500  NA 134* 136 132* 134*  K 3.6 2.7* 3.0* 3.2*  CL 102 100 97 98  CO2 22  25 25 26   GLUCOSE 83 108* 116* 114*  BUN 10 11 12 13   CREATININE 0.72 0.80 0.81 0.80  CALCIUM 7.5* 7.2* 7.3* 7.4*  MG 1.8 1.6  --   --     Liver function tests:  Recent Labs  08/24/12 1225 08/26/12 0500  AST 50* 41*  ALT 41* 39*  ALKPHOS 242* 220*  BILITOT 0.6 0.5  PROT 5.5* 5.2*  ALBUMIN 1.6* 1.6*   No results found for this basename: LIPASE, AMYLASE,  in the last 72 hours  CBC:  Recent Labs  08/24/12 1225 08/25/12 0400 08/26/12 0500  WBC  8.4 7.5 7.9  NEUTROABS 6.2  --  5.9  HGB 13.1 12.5 12.1  HCT 39.2 36.8 36.1  MCV 81.0 81.4 80.9  PLT 136* 112* 120*    Cardiac Enzymes:  Recent Labs  08/24/12 1640 08/24/12 2200 08/25/12 0400  TROPONINI <0.30 <0.30 <0.30    Coagulation Studies:  Recent Labs  08/24/12 1225 08/25/12 1430 08/26/12 0500  LABPROT 25.4* 29.4* 25.0*  INR 2.44* 2.98* 2.39*    Other: No components found with this basename: POCBNP,  No results found for this basename: DDIMER,  in the last 72 hours  Recent Labs  08/24/12 1225  HGBA1C 6.1*   No results found for this basename: CHOL, HDL, LDLCALC, TRIG, CHOLHDL,  in the last 72 hours  Recent Labs  08/24/12 1225  TSH 4.183   No results found for this basename: VITAMINB12, FOLATE, FERRITIN, TIBC, IRON, RETICCTPCT,  in the last 72 hours   Other results:  Tele:  Atrial fib with rate of 130  Medications:    Infusions: . diltiazem (CARDIZEM) infusion 15 mg/hr (08/26/12 0734)    Scheduled Medications: . amiodarone  400 mg Oral BID  . antiseptic oral rinse  15 mL Mouth Rinse BID  . aspirin EC  81 mg Oral q morning - 10a  . atorvastatin  10 mg Oral QHS  . buPROPion  150 mg Oral q morning - 10a  . cefTRIAXone (ROCEPHIN)  IV  1 g Intravenous Q24H  . cholestyramine  4 g Oral BID  . docusate sodium  100 mg Oral BID  . feeding supplement  1 Container Oral BID BM  . furosemide  60 mg Intravenous Q12H  . insulin aspart  0-15 Units Subcutaneous TID WC  . letrozole  2.5 mg Oral Daily  . levothyroxine  125 mcg Oral QAC breakfast  . megestrol  400 mg Oral BID  . metoprolol  5 mg Intravenous Once  . multivitamin with minerals  1 tablet Oral Daily  . pantoprazole  40 mg Oral Q0600  . saccharomyces boulardii  250 mg Oral BID  . sodium chloride  3 mL Intravenous Q12H  . sodium chloride  3 mL Intravenous Q12H  . vancomycin  1,000 mg Intravenous Q12H    Assessment/ Plan:   Principal Problem:   A-fib Active Problems:   HYPOTHYROIDISM    DIABETES MELLITUS, TYPE II   Morbid obesity   DEPRESSION   OBSTRUCTIVE SLEEP APNEA   GERD   DVT, HX OF   Liver disease, chronic, with cirrhosis   Cancer of breast   Hypokalemia   Obesity, unspecified   Severe malnutrition in the context of acute illness   Anasarca   UTI (lower urinary tract infection)   CHF, acute   Cellulitis of leg, left Hypokalemia- replaced   Dyanna has normal LV function by echo 07/14/12 and by repeat echo yesterday .  Her rapid  HR is due to the stress of her underlying illnesses ( breast cancer, chemotherapy, malnutrition)  as noted in Dr. Odessa Fleming note from yesterday.    He generalized edema is more likely to be due to her severe protein malnutrition ( albumin of 1.6) and not LV dysfunction.  She may have some degree of diastolic dysfunction which would be exacerbated by her atrial fib and by the rapid rate but her systolic function is well preserved.  I do not think her edema will resolve with diuresis alone but will take overall improvement in her condition ( especially nutritional status) to resolve.  i have decreased her Lasix frequency to Q12 HR for now.     I have restarted PO metoprolol and have also ordered IV metoprolol .  i would like to see if we can get her HR into the normal range in a short period of time.  This will allow [name] to control her HR tomorrow when she is going for her PEG tube.    Disposition:  Length of Stay: 2  Vesta Mixer, Montez Hageman., MD, [facility] 08/26/2012, 7:44 AM Office [phone] Pager [phone]

## 2012-08-26 NOTE — Progress Notes (Signed)
[facility] PROGRESS NOTE  [name] WUJ:[id number] DOB: 08/27/1955 DOA: 08/24/2012 PCP: [name]., NP  Assessment/Plan: #1 A. fib with RVR Questionable etiology. May be secondary to stress and severe malnutrition and ongoing cancer. Cardiac enzymes are negative x3. Pro BNP is elevated. 2-D echo is pending.  -Patient was started on eliquis for anticoagulation per cardiology in anticipation of possible cardioversion, but eliquis was dc'ed and pt initially placed patient on full dose IV heparin in anticipation of possible PEG tube for her severe malnutrition, but due elevated INR, the heparin is now dc'ed until INR drops to <2 then it will be resumed. -will give another dose of vit k  today, recheck INR in am, and if still elevated in am will likely need FFP in am prior to the  procedure  -continue Cardizem drip and amiodarone for rate control, and metoprolol restarted per cards.   -will defer further anticoagulation after PEG to cardiology, -appreciate the input and recommendations.   #2  Anarsarca/acute CHF exacerbation Likely secondary to severe malnutrition in the setting of afib with RVR likely leading to acute CHF exacerbation with pulmonary edema. Patient's albumin is 1.6. Chest x-ray consistent with pulmonary edema. - IV Lasix dose decreased to Q12hr per cards.  -continue Strict I.'s and O.'s. Daily weights.  - 2-D echo with nl EF 55-60%, no wall motion abnormalities.  -Cardiology is following and appreciate assistance  #3 urinary tract infection Urine cultures still pending. Continue IV Rocephin.  #4 left lower extremity cellulitis Continue IV Rocephin and IV vancomycin.  #5 gastroesophageal reflux disease Continue PPI.  #6 type 2 diabetes Hemoglobin A1c 6.1. Continue sliding scale insulin.  #7 severe malnutrition/PEG placement Patient with 6-8 weeks of poor oral intake, decreased appetite. Megace started 4/15.  - IR planning PEG tube placement in am  pending INR being down to <2. As above  -Discussed with patient's oncologist Dr. [name]- per Dr.Thompson  #8 history of breast cancer status post right mastectomy Pathologic staging PET 3, P. and 1, stage IIIa. ER and PR positive at 100%, HER -2/neu negative. Per oncology.  #9 chronic liver disease -Continue diuretics. -contributing to likely underlying coagulopathy  #10 hypokalemia Secondary to diuretics.  -Replace K.  #11 prophylaxis PPI for GI prophylaxis. Heparin for DVT prophylaxis.     Code Status: Full Family Communication: Updated patient and husband at bedside. Disposition Plan: Home when medically stable.   Consultants:  Cardiology: Dr. Graciela Husbands 08/24/2012  Oncology Dr. [name] 08/24/2012  Procedures:  2-D echo is pending 08/26/2038  Chest x-ray 08/24/2012, 08/25/2012  Antibiotics:  IV Rocephin 08/24/12  IV Vancomycin 08/25/12  HPI/Subjective: Chart reviewed, patient denies any chest pain or shortness of breath. States not taking any pos Objective: Filed Vitals:   08/26/12 0400 08/26/12 0500 08/26/12 0600 08/26/12 0700  BP: 109/70 129/72 124/66 136/67  Pulse: 146 50 65 98  Temp: 97.8 F (36.6 C)     TempSrc: Axillary     Resp: 11 14 10 20   Height:      Weight:      SpO2: 94% 98% 96% 95%    Intake/Output Summary (Last 24 hours) at 08/26/12 0849 Last data filed at 08/26/12 0700  Gross per 24 hour  Intake 1625.42 ml  Output   2155 ml  Net -529.58 ml   Filed Weights   08/24/12 1600  Weight: 97 kg (213 lb 13.5 oz)    Exam:   General:  NAD. Alert and orientedx3  Cardiovascular: Irregularly irregular, tachy  Respiratory:decreased BS at bases, no crackles and no wheezes  Abdomen: Soft/NT/ND/+BS  Extremities: No c/c. +3-4 BLE edema to hips. Left lower leg near the ankle with an area of erythema, and warmth  medially.  Data Reviewed: Basic Metabolic Panel:  Recent Labs Lab 08/24/12 1225 08/25/12 0400 08/25/12 1430 08/26/12 0500   NA 134* 136 132* 134*  K 3.6 2.7* 3.0* 3.2*  CL 102 100 97 98  CO2 22 25 25 26   GLUCOSE 83 108* 116* 114*  BUN 10 11 12 13   CREATININE 0.72 0.80 0.81 0.80  CALCIUM 7.5* 7.2* 7.3* 7.4*  MG 1.8 1.6  --   --    Liver Function Tests:  Recent Labs Lab 08/24/12 1225 08/26/12 0500  AST 50* 41*  ALT 41* 39*  ALKPHOS 242* 220*  BILITOT 0.6 0.5  PROT 5.5* 5.2*  ALBUMIN 1.6* 1.6*   No results found for this basename: LIPASE, AMYLASE,  in the last 168 hours No results found for this basename: AMMONIA,  in the last 168 hours CBC:  Recent Labs Lab 08/24/12 1225 08/25/12 0400 08/26/12 0500  WBC 8.4 7.5 7.9  NEUTROABS 6.2  --  5.9  HGB 13.1 12.5 12.1  HCT 39.2 36.8 36.1  MCV 81.0 81.4 80.9  PLT 136* 112* 120*   Cardiac Enzymes:  Recent Labs Lab 08/24/12 1640 08/24/12 2200 08/25/12 0400  TROPONINI <0.30 <0.30 <0.30   BNP (last 3 results)  Recent Labs  08/24/12 1225 08/25/12 0400 08/26/12 0500  PROBNP 1816.0* 1915.0* 1184.0*   CBG:  Recent Labs Lab 08/24/12 2135 08/25/12 0752 08/25/12 1144 08/25/12 1811 08/25/12 2214  GLUCAP 101* 100* 104* 118* 107*    Recent Results (from the past 240 hour(s))  URINE CULTURE     Status: None   Collection Time    08/24/12 12:05 PM      Result Value Range Status   Specimen Description URINE, CLEAN CATCH   Final   Special Requests NONE   Final   Culture  Setup Time 08/24/2012 22:00   Final   Colony Count PENDING   Incomplete   Culture Culture reincubated for better growth   Final   Report Status PENDING   Incomplete  MRSA PCR SCREENING     Status: None   Collection Time    08/24/12  4:00 PM      Result Value Range Status   MRSA by PCR NEGATIVE  NEGATIVE Final   Comment:            The GeneXpert MRSA Assay (FDA     approved for NASAL specimens     only), is one component of a     comprehensive MRSA colonization     surveillance program. It is not     intended to diagnose MRSA     infection nor to guide or      monitor treatment for     MRSA infections.     Studies: Dg Chest 2 View  08/24/2012  *RADIOLOGY REPORT*  Clinical Data: Shortness of breath, history of breast cancer  CHEST - 2 VIEW  Comparison: 08/03/2012  Findings: Cardiomediastinal silhouette is stable.  Left subclavian Port-A-Cath is unchanged in position.  There is small right pleural effusion with right basilar atelectasis or infiltrate.  Trace left basilar atelectasis or infiltrate.  Nodular density left base measures about 1 cm.  This may represent nipple shadow.  Repeat frontal view of the chest with nipple markers is recommended.  The patient is  status post right mastectomy.  IMPRESSION: Status post right mastectomy. Small right pleural effusion with right basilar atelectasis or infiltrate. Trace left basilar atelectasis or infiltrate.  Nodular density left base may represent nipple shadow.  Repeat frontal view of the chest with nipple markers is recommended.   Original Report Authenticated By: Natasha Mead, M.D.    Dg Chest Port 1 View  08/26/2012  *RADIOLOGY REPORT*  Clinical Data: Follow up pulmonary edema  PORTABLE CHEST - 1 VIEW  Comparison: Prior chest x-ray 08/25/2012  Findings: Stable position of left subclavian approach portacatheter.  The catheter tip is at the superior cavoatrial junction.  No significant interval change in the appearance of the chest with persistent pulmonary vascular congestion, mild edema and bibasilar pleural effusions with associated atelectasis.  Surgical changes of prior mastectomy and axillary node dissection are similar.  Stable cardiomegaly.  No pneumothorax.  No acute osseous abnormality.  IMPRESSION: No significant interval change. Stable CHF versus volume overload.   Original Report Authenticated By: Malachy Moan, M.D.    Portable Chest 1 View  08/25/2012  *RADIOLOGY REPORT*  Clinical Data: Atrial fibrillation, pleural effusion.  PORTABLE CHEST - 1 VIEW  Comparison: 08/24/2012 and PET 07/05/2012.   Findings: Trachea is midline.  Heart size stable.  Left subclavian Port-A-Cath tip projects over the SVC.  Mild diffuse bilateral air space disease, bibasilar predominant, with slight worsening from 08/24/2012.  Bilateral pleural effusions. Surgical clips in the right axillary region with changes of right mastectomy.  IMPRESSION: Favor worsening congestive heart failure.   Original Report Authenticated By: Leanna Battles, M.D.     Scheduled Meds: . amiodarone  400 mg Oral BID  . antiseptic oral rinse  15 mL Mouth Rinse BID  . aspirin EC  81 mg Oral q morning - 10a  . atorvastatin  10 mg Oral QHS  . buPROPion  150 mg Oral q morning - 10a  . cefTRIAXone (ROCEPHIN)  IV  1 g Intravenous Q24H  . cholestyramine  4 g Oral BID  . docusate sodium  100 mg Oral BID  . feeding supplement  1 Container Oral BID BM  . furosemide  60 mg Intravenous Q12H  . insulin aspart  0-15 Units Subcutaneous TID WC  . letrozole  2.5 mg Oral Daily  . levothyroxine  125 mcg Oral QAC breakfast  . megestrol  400 mg Oral BID  . metoprolol  5 mg Intravenous Once  . metoprolol tartrate  25 mg Oral BID  . multivitamin with minerals  1 tablet Oral Daily  . pantoprazole  40 mg Oral Q0600  . phytonadione  7.5 mg Oral Once  . saccharomyces boulardii  250 mg Oral BID  . sodium chloride  3 mL Intravenous Q12H  . sodium chloride  3 mL Intravenous Q12H  . vancomycin  1,000 mg Intravenous Q12H   Continuous Infusions: . diltiazem (CARDIZEM) infusion 15 mg/hr (08/26/12 0734)    Principal Problem:   A-fib Active Problems:   HYPOTHYROIDISM   DIABETES MELLITUS, TYPE II   Morbid obesity   DEPRESSION   OBSTRUCTIVE SLEEP APNEA   GERD   DVT, HX OF   Liver disease, chronic, with cirrhosis   Cancer of breast   Hypokalemia   Obesity, unspecified   Severe malnutrition in the context of acute illness   Anasarca   UTI (lower urinary tract infection)   CHF, acute   Cellulitis of leg, left    Time spent: > 30  mins    Angely Dietz C  [facility] Pager [phone]. If 7PM-7AM, please contact night-coverage at [email or web address], password [facility] 08/26/2012, 8:49 AM  LOS: 2 days

## 2012-08-26 NOTE — Evaluation (Signed)
Physical Therapy Evaluation Patient Details Name: [name] MRN: [id number] DOB: 25-Nov-1955 Today's Date: 08/26/2012 Time: 1133-1206 PT Time Calculation (min): 33 min  PT Assessment / Plan / Recommendation Clinical Impression  Pt presents with afib and UTI with history of breast cancer, DVT, GERD and chronic liver disease.  Note that she had just D/C'd on 4/8 with malnutrition to [facility] and had bad experience so then went home with husband.  Pt tolerated OOB to chair, however she was very anxious about getting up and her HR getting high (it was up to 150's this morning prior to eval), however her HR remained in low 100's during most of session (did get to 121 for brief period during standing).  Pt will benefit from skilled PT in acute venue to address deficits.  PT recommends HHPT with 24/7 care/supervision at DC to maximize pts safety and function.     PT Assessment  Patient needs continued PT services    Follow Up Recommendations  Home health PT;Supervision/Assistance - 24 hour    Does the patient have the potential to tolerate intense rehabilitation      Barriers to Discharge None      Equipment Recommendations  None recommended by PT    Recommendations for Other Services     Frequency Min 3X/week    Precautions / Restrictions Precautions Precautions: Fall Precaution Comments: Monitor HR Restrictions Weight Bearing Restrictions: No   Pertinent Vitals/Pain No pain during session      Mobility  Bed Mobility Bed Mobility: Supine to Sit;Sitting - Scoot to Edge of Bed Supine to Sit: 1: +2 Total assist;HOB flat;With rails Supine to Sit: Patient Percentage: 50% Sitting - Scoot to Edge of Bed: 3: Mod assist Details for Bed Mobility Assistance: Some assist for LEs out of bed and for trunk to attain sitting position with max cues for hand placement and technique to self assist.  Transfers Transfers: Sit to Stand;Stand to Sit;Stand Pivot Transfers Sit to Stand:  From elevated surface;1: +2 Total assist;From bed;With upper extremity assist Sit to Stand: Patient Percentage: 70% Stand to Sit: 1: +2 Total assist;To chair/3-in-1;To bed;With upper extremity assist Stand to Sit: Patient Percentage: 70% Stand Pivot Transfers: 1: +2 Total assist Stand Pivot Transfers: Patient Percentage: 70% Details for Transfer Assistance: vc's for hand placement.  Pt. with uncontrolled descent when sitting with max cues for safety. pt very nervous about standing, however she did agree to stand x 2 and then get to chair.  Ambulation/Gait Ambulation/Gait Assistance: Not tested (comment) Assistive device: Rolling walker    Exercises     PT Diagnosis: Difficulty walking;Generalized weakness  PT Problem List: Decreased strength;Decreased range of motion;Decreased activity tolerance;Decreased balance;Decreased mobility;Decreased knowledge of use of DME;Decreased safety awareness;Decreased knowledge of precautions;Cardiopulmonary status limiting activity PT Treatment Interventions: DME instruction;Gait training;Functional mobility training;Therapeutic activities;Stair training;Therapeutic exercise;Balance training;Patient/family education   PT Goals Acute Rehab PT Goals PT Goal Formulation: With patient/family Time For Goal Achievement: 09/09/12 Potential to Achieve Goals: Fair Pt will go Supine/Side to Sit: with supervision PT Goal: Supine/Side to Sit - Progress: Goal set today Pt will go Sit to Supine/Side: with supervision PT Goal: Sit to Supine/Side - Progress: Goal set today Pt will go Sit to Stand: with supervision PT Goal: Sit to Stand - Progress: Goal set today Pt will go Stand to Sit: with supervision PT Goal: Stand to Sit - Progress: Goal set today Pt will Transfer Bed to Chair/Chair to Bed: with min assist PT Transfer Goal: Bed to Chair/Chair  to Bed - Progress: Goal set today Pt will Ambulate: 51 - 150 feet;with min assist;with least restrictive assistive  device PT Goal: Ambulate - Progress: Goal set today Pt will Go Up / Down Stairs: 3-5 stairs;with min assist;with rail(s);with least restrictive assistive device PT Goal: Up/Down Stairs - Progress: Goal set today Pt will Perform Home Exercise Program: with supervision, verbal cues required/provided PT Goal: Perform Home Exercise Program - Progress: Goal set today  Visit Information  Last PT Received On: 08/26/12 Assistance Needed: +2    Subjective Data  Subjective: I'm afraid I'll fall.  Patient Stated Goal: to return home with husband.    Prior Functioning  Home Living Lives With: Spouse;Family Available Help at Discharge: Family;Available 24 hours/day Type of Home: House Home Access: Stairs to enter [employer] of Steps: 3 Entrance Stairs-Rails: None Home Layout: One level Bathroom Shower/Tub: Forensic scientist: Standard Home Adaptive Equipment: Walker - rolling;Shower chair without back Prior Function Level of Independence: Needs assistance Needs Assistance: Bathing;Dressing;Toileting;Meal Prep;Light Housekeeping Bath: Moderate Dressing: Maximal Toileting: Moderate Meal Prep: Total Light Housekeeping: Total Able to Take Stairs?: No Driving: No Comments: Pt with recent hospitalization, then discharged to SNF for two days before family took her home. Communication Communication: No difficulties    Cognition  Cognition Arousal/Alertness: Awake/alert Behavior During Therapy: Anxious Overall Cognitive Status: Within Functional Limits for tasks assessed General Comments: Pt slow to respond    Extremity/Trunk Assessment Right Upper Extremity Assessment RUE ROM/Strength/Tone: Deficits RUE ROM/Strength/Tone Deficits: strength grossly 4/5 RUE Coordination: WFL - gross/fine motor Left Upper Extremity Assessment LUE ROM/Strength/Tone: Deficits LUE ROM/Strength/Tone Deficits: strength grossly 4/5 LUE Coordination: WFL - gross/fine  motor Right Lower Extremity Assessment RLE ROM/Strength/Tone: Deficits RLE ROM/Strength/Tone Deficits: hip flex, knee flex/ext and ankle DF grossly 3/5 RLE Sensation: WFL - Light Touch Left Lower Extremity Assessment LLE ROM/Strength/Tone: Deficits LLE ROM/Strength/Tone Deficits: hip flex, knee flex/ext and ankle DF grossly 3/5 LLE Sensation: WFL - Light Touch   Balance Balance Balance Assessed: Yes Static Sitting Balance Static Sitting - Balance Support: No upper extremity supported Static Sitting - Level of Assistance: 5: Stand by assistance  End of Session PT - End of Session Equipment Utilized During Treatment: Gait belt Activity Tolerance: Patient limited by fatigue (limited by anxiety) Patient left: in chair;with call bell/phone within reach;with family/visitor present Nurse Communication: Mobility status  GP     Vista Deck 08/26/2012, 1:25 PM

## 2012-08-27 ENCOUNTER — Inpatient Hospital Stay (HOSPITAL_COMMUNITY): Payer: 59

## 2012-08-27 LAB — URINE CULTURE: Colony Count: >=100000

## 2012-08-27 LAB — BASIC METABOLIC PANEL
BUN: 14 mg/dL (ref 6–23)
CO2: 31 mEq/L (ref 19–32)
Chloride: 97 mEq/L (ref 96–112)
Creatinine, Ser: 0.83 mg/dL (ref 0.50–1.10)
Glucose, Bld: 101 mg/dL — ABNORMAL HIGH (ref 70–99)

## 2012-08-27 LAB — CBC
HCT: 33.7 % — ABNORMAL LOW (ref 36.0–46.0)
MCH: 27.5 pg (ref 26.0–34.0)
MCHC: 33.8 g/dL (ref 30.0–36.0)
MCV: 81.4 fL (ref 78.0–100.0)
RDW: 23.4 % — ABNORMAL HIGH (ref 11.5–15.5)

## 2012-08-27 LAB — TYPE AND SCREEN

## 2012-08-27 LAB — GLUCOSE, CAPILLARY
Glucose-Capillary: 113 mg/dL — ABNORMAL HIGH (ref 70–99)
Glucose-Capillary: 101 mg/dL — ABNORMAL HIGH (ref 70–99)

## 2012-08-27 LAB — PROTIME-INR
INR: 1.62 — ABNORMAL HIGH (ref 0.00–1.49)
Prothrombin Time: 21 seconds — ABNORMAL HIGH (ref 11.6–15.2)
Prothrombin Time: 18.7 seconds — ABNORMAL HIGH (ref 11.6–15.2)

## 2012-08-27 MED ORDER — LIDOCAINE HCL 1 % IJ SOLN
INTRAMUSCULAR | Status: AC
  Filled 2012-08-27: qty 20

## 2012-08-27 MED ORDER — IOHEXOL 300 MG/ML  SOLN
25.0000 mL | Freq: Once | INTRAMUSCULAR | Status: AC | PRN
  Administered 2012-08-27: 20 mL

## 2012-08-27 MED ORDER — HEPARIN (PORCINE) IN NACL 100-0.45 UNIT/ML-% IJ SOLN
1000.0000 [IU]/h | INTRAMUSCULAR | Status: DC
  Administered 2012-08-28: 1000 [IU]/h via INTRAVENOUS
  Filled 2012-08-27: qty 250

## 2012-08-27 MED ORDER — MORPHINE SULFATE 2 MG/ML IJ SOLN
2.0000 mg | Freq: Once | INTRAMUSCULAR | Status: AC
  Administered 2012-08-27: 2 mg via INTRAVENOUS
  Filled 2012-08-27: qty 1

## 2012-08-27 MED ORDER — POTASSIUM CHLORIDE 10 MEQ/100ML IV SOLN: 10.0000 meq | INTRAVENOUS | Status: DC

## 2012-08-27 MED ORDER — DILTIAZEM LOAD VIA INFUSION: 10.0000 mg | Freq: Once | INTRAVENOUS | Status: DC

## 2012-08-27 MED ORDER — HEPARIN (PORCINE) IN NACL 100-0.45 UNIT/ML-% IJ SOLN
1150.0000 [IU]/h | INTRAMUSCULAR | Status: DC
  Filled 2012-08-27: qty 250

## 2012-08-27 MED ORDER — POTASSIUM CHLORIDE 10 MEQ/100ML IV SOLN
10.0000 meq | INTRAVENOUS | Status: AC
  Administered 2012-08-27 (×5): 10 meq via INTRAVENOUS
  Filled 2012-08-27: qty 500

## 2012-08-27 MED ORDER — FENTANYL CITRATE 0.05 MG/ML IJ SOLN
INTRAMUSCULAR | Status: AC
  Filled 2012-08-27: qty 6

## 2012-08-27 MED ORDER — DILTIAZEM LOAD VIA INFUSION
10.0000 mg | Freq: Once | INTRAVENOUS | Status: AC
  Administered 2012-08-27: 10 mg via INTRAVENOUS
  Filled 2012-08-27: qty 10

## 2012-08-27 MED ORDER — MIDAZOLAM HCL 2 MG/2ML IJ SOLN
INTRAMUSCULAR | Status: DC | PRN
  Administered 2012-08-27: 0.5 mg via INTRAVENOUS

## 2012-08-27 MED ORDER — DILTIAZEM HCL 100 MG IV SOLR
5.0000 mg/h | INTRAVENOUS | Status: AC
  Administered 2012-08-27: 5 mg/h via INTRAVENOUS

## 2012-08-27 MED ORDER — DILTIAZEM HCL 100 MG IV SOLR: 5.0000 mg/h | INTRAVENOUS | Status: DC

## 2012-08-27 MED ORDER — METOPROLOL TARTRATE 50 MG PO TABS
50.0000 mg | ORAL_TABLET | Freq: Two times a day (BID) | ORAL | Status: DC
  Administered 2012-08-27 – 2012-08-29 (×5): 50 mg via ORAL
  Filled 2012-08-27 (×8): qty 1

## 2012-08-27 MED ORDER — POTASSIUM CHLORIDE 20 MEQ/15ML (10%) PO LIQD
40.0000 meq | Freq: Once | ORAL | Status: AC
  Administered 2012-08-27: 40 meq via ORAL
  Filled 2012-08-27: qty 30

## 2012-08-27 MED ORDER — MIDAZOLAM HCL 2 MG/2ML IJ SOLN
INTRAMUSCULAR | Status: AC
  Filled 2012-08-27: qty 6

## 2012-08-27 MED ORDER — DILTIAZEM HCL 100 MG IV SOLR
5.0000 mg/h | INTRAVENOUS | Status: DC
  Administered 2012-08-27: 5 mg/h via INTRAVENOUS
  Filled 2012-08-27: qty 100

## 2012-08-27 MED ORDER — MAGNESIUM SULFATE IN D5W 10-5 MG/ML-% IV SOLN
1.0000 g | Freq: Once | INTRAVENOUS | Status: AC
  Administered 2012-08-27: 1 g via INTRAVENOUS
  Filled 2012-08-27: qty 100

## 2012-08-27 NOTE — Progress Notes (Signed)
Pt. Requested to be placed on cpap at this time.  Humidifier full, bled in 4L O2, assisted w/ placement of nasal pillows, pt comfortable at this time, RT will monitor.

## 2012-08-27 NOTE — Procedures (Signed)
Procedure:  Gastrostomy tube  Findings:  20 Fr bumper retention gastrostomy tube placed with tip in body of stomach

## 2012-08-27 NOTE — Progress Notes (Signed)
Rx Anticoagulation note  V/o from Dr Archer Asa (radiology) received by Misty Stanley RN regarding when to restart anticoagulation post tube placement.  MD said to resume IV heparin in am 4/19. Lorenza Evangelist 08/27/2012 6:47 PM

## 2012-08-27 NOTE — Progress Notes (Signed)
ANTICOAGULATION CONSULT NOTE - Follow-up Consult  Pharmacy Consult for IV Heparin Indication: Afib with RVR  Allergies  Allergen Reactions  . Olmesartan Medoxomil Cough    REACTION: ? if cough  . Tetracycline Hcl     Unknown reaction, too long for patient to remember   . Venlafaxine     REACTION: severe dry moouth  . Adhesive (Tape) Rash    Patient Measurements: Height: 5\' 4"  (162.6 cm) Weight: 203 lb 0.7 oz (92.1 kg) IBW/kg (Calculated) : 54.7 Heparin Dosing Weight: 66kg  Vital Signs: Temp: 98.1 F (36.7 C) (04/18 1600) Temp src: Axillary (04/18 1600) BP: 108/67 mmHg (04/18 1800) Pulse Rate: 120 (04/18 1802)  Labs:  Recent Labs  08/24/12 2200  08/25/12 0400  08/25/12 1430 08/26/12 0500 08/27/12 0355 08/27/12 0824 08/27/12 1415  HGB  --   < > 12.5  --   --  12.1 11.4*  --   --   HCT  --   --  36.8  --   --  36.1 33.7*  --   --   PLT  --   --  112*  --   --  120* 113*  --   --   LABPROT  --   --   --   < > 29.4* 25.0*  --  21.0* 18.7*  INR  --   --   --   < > 2.98* 2.39*  --  1.89* 1.62*  CREATININE  --   < > 0.80  --  0.81 0.80 0.83  --   --   TROPONINI <0.30  --  <0.30  --   --   --   --   --   --   < > = values in this interval not displayed.  Estimated Creatinine Clearance: 81.3 ml/min (by C-G formula based on Cr of 0.83).   Medical History: Past Medical History  Diagnosis Date  . Depression   . DVT (deep venous thrombosis)     hx of on HRT left leg ~2006  . GERD (gastroesophageal reflux disease)   . Hyperlipidemia   . Hypertension   . Hypothyroidism   . PPD positive, treated     rx inh   . OSA on CPAP   . Diabetes mellitus   . 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 Breast cancer     a. 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  PMHx  significant for breast cancer s/p mastectomy, chemotherapy with no further cycles planned, newly diagnosed Afib, CHF, malnutrition, and chronic liver disease with cirrhosis.     Cardiology consulted for Afib with RVR, started apixaban 4/15.  Apixaban d/c'd after 1 dose due to need for PEG tube placement and IV Heparin bridge started 4/15.  PEG tube placement planned for 48 hours after apixaban dose and when INR <2.    INR has been slowly rising since March and could be due to multiple factors including chronic liver disease and malnutrition.  With INR>2, heparin was d/c'd 4/15 with orders to resume once INR<2.0   Pt received PO Vitamin K x 2 days and INR 1.89 this AM.  Radiology ordered 1 unit of FFP this AM and will recheck INR .  Pt now s/p PEG placement- v/o order received from radiology by RN  to restart IV heparin 4/19 am per Rx.  Plan:   Restart IV heparin @ 1000 units/hr @ 0600 4/19.  Check HL @ noon.  F/U plans for long-term anticoagulation.   Lorenza Evangelist 08/27/2012 6:47 PM

## 2012-08-27 NOTE — Progress Notes (Addendum)
[facility] PROGRESS NOTE  [name] XBJ:[id number] DOB: 04-21-1956 DOA: 08/24/2012 PCP: [name]., NP  Assessment/Plan: #1 A. fib with RVR Questionable etiology. May be secondary to stress and severe malnutrition and ongoing cancer. Cardiac enzymes are negative x3. Pro BNP is elevated. 2-D echo is pending.  -Patient was started on eliquis for anticoagulation per cardiology in anticipation of possible cardioversion, but eliquis was dc'ed and pt initially placed patient on full dose IV heparin in anticipation of possible PEG tube for her severe malnutrition, but due elevated INR, the heparin was dc'ed pending INR dropping to <2 then it will be resumed. -INR is1.89 this am 4/18 following vit k, discussed with IR- she will be given FFP to bring INR closer to 1.5 for PEG placement today  -continue amiodarone and metoprolol per cards, plan is to d/c cardizem drip at noon today- HR is so far better controlled.   -will defer further anticoagulation after PEG to cardiology, -appreciate the input and recommendations.   #2  Anarsarca/acute CHF exacerbation Likely secondary to severe malnutrition in the setting of afib with RVR likely leading to acute CHF exacerbation with pulmonary edema. Patient's albumin is 1.6. Chest x-ray consistent with pulmonary edema. - continue current IV Lasix, about 3L out in the past 24hrs.   -continue Strict I.'s and O.'s. Daily weights.  - 2-D echo with nl EF 55-60%, no wall motion abnormalities.  -Cardiology is following and appreciate assistance  #3 kleb urinary tract infection -kleb sensitive toRocephin, will continue.  #4 left lower extremity cellulitis Continue IV Rocephin and IV vancomycin.  #5 gastroesophageal reflux disease Continue PPI.  #6 type 2 diabetes Hemoglobin A1c 6.1. Continue sliding scale insulin.  #7 severe malnutrition/PEG placement Patient with 6-8 weeks of poor oral intake, decreased appetite. Megace started 4/15.   - IR planning PEG tube placement today as above  -Discussed with patient's oncologist Dr. [name]- per Dr.Thompson  #8 history of breast cancer status post right mastectomy Pathologic staging PET 3, P. and 1, stage IIIa. ER and PR positive at 100%, HER -2/neu negative. Per oncology.  #9 chronic liver disease -Continue diuretics. -contributing to likely underlying coagulopathy  #10 hypokalemia Secondary to diuretics.  -Replace K.  #11 prophylaxis PPI for GI prophylaxis. Heparin for DVT prophylaxis.     Code Status: Full Family Communication: Updated patient and husband at bedside. Disposition Plan: Home when medically stable.   Consultants:  Cardiology: Dr. Graciela Husbands 08/24/2012  Oncology Dr. [name] 08/24/2012  Procedures:  2-D echo is pending 08/26/2038  Chest x-ray 08/24/2012, 08/25/2012  Antibiotics:  IV Rocephin 08/24/12  IV Vancomycin 08/25/12  HPI/Subjective: patient denies any c/o, eager for PEG to be done today. Husband at bedside Objective: Filed Vitals:   08/27/12 0500 08/27/12 0600 08/27/12 0700 08/27/12 0800  BP: 100/72 87/54 94/62  101/71  Pulse: 77 72 93 105  Temp:      TempSrc:      Resp: 14 9 10 18   Height:      Weight:      SpO2: 92% 94% 93% 94%    Intake/Output Summary (Last 24 hours) at 08/27/12 0848 Last data filed at 08/27/12 0800  Gross per 24 hour  Intake 1059.75 ml  Output   4000 ml  Net -2940.25 ml   Filed Weights   08/24/12 1600 08/27/12 0400 08/27/12 0458  Weight: 97 kg (213 lb 13.5 oz) 92.1 kg (203 lb 0.7 oz) 92.1 kg (203 lb 0.7 oz)    Exam:   General:  NAD. Alert and orientedx3  Cardiovascular: Irregularly irregular, rate better controlled  Respiratory:decreased BS at bases, no crackles and no wheezes  Abdomen: Soft/NT/ND/+BS  Extremities: No c/c. +3 BLE edema to hips. Left lower leg near the ankle with an area of erythema, and warmth  medially.  Data Reviewed: Basic Metabolic Panel:  Recent Labs Lab  08/24/12 1225 08/25/12 0400 08/25/12 1430 08/26/12 0500 08/27/12 0355  NA 134* 136 132* 134* 133*  K 3.6 2.7* 3.0* 3.2* 3.1*  CL 102 100 97 98 97  CO2 22 25 25 26 31   GLUCOSE 83 108* 116* 114* 101*  BUN 10 11 12 13 14   CREATININE 0.72 0.80 0.81 0.80 0.83  CALCIUM 7.5* 7.2* 7.3* 7.4* 7.4*  MG 1.8 1.6  --   --   --    Liver Function Tests:  Recent Labs Lab 08/24/12 1225 08/26/12 0500  AST 50* 41*  ALT 41* 39*  ALKPHOS 242* 220*  BILITOT 0.6 0.5  PROT 5.5* 5.2*  ALBUMIN 1.6* 1.6*   No results found for this basename: LIPASE, AMYLASE,  in the last 168 hours No results found for this basename: AMMONIA,  in the last 168 hours CBC:  Recent Labs Lab 08/24/12 1225 08/25/12 0400 08/26/12 0500 08/27/12 0355  WBC 8.4 7.5 7.9 6.8  NEUTROABS 6.2  --  5.9  --   HGB 13.1 12.5 12.1 11.4*  HCT 39.2 36.8 36.1 33.7*  MCV 81.0 81.4 80.9 81.4  PLT 136* 112* 120* 113*   Cardiac Enzymes:  Recent Labs Lab 08/24/12 1640 08/24/12 2200 08/25/12 0400  TROPONINI <0.30 <0.30 <0.30   BNP (last 3 results)  Recent Labs  08/24/12 1225 08/25/12 0400 08/26/12 0500  PROBNP 1816.0* 1915.0* 1184.0*   CBG:  Recent Labs Lab 08/26/12 0741 08/26/12 1148 08/26/12 1703 08/26/12 2229 08/27/12 0748  GLUCAP 116* 140* 95 109* 90    Recent Results (from the past 240 hour(s))  URINE CULTURE     Status: None   Collection Time    08/24/12 12:05 PM      Result Value Range Status   Specimen Description URINE, CLEAN CATCH   Final   Special Requests NONE   Final   Culture  Setup Time 08/24/2012 22:00   Final   Colony Count >=100,000 COLONIES/ML   Final   Culture GRAM NEGATIVE RODS   Final   Report Status PENDING   Incomplete  MRSA PCR SCREENING     Status: None   Collection Time    08/24/12  4:00 PM      Result Value Range Status   MRSA by PCR NEGATIVE  NEGATIVE Final   Comment:            The GeneXpert MRSA Assay (FDA     approved for NASAL specimens     only), is one component  of a     comprehensive MRSA colonization     surveillance program. It is not     intended to diagnose MRSA     infection nor to guide or     monitor treatment for     MRSA infections.     Studies: Dg Abd 1 View  08/27/2012  *RADIOLOGY REPORT*  Clinical Data: Check progression of barium contrast.  ABDOMEN - 1 VIEW  Comparison: Abdominal radiograph performed 08/03/2012  Findings: Ingested barium contrast has progressed into the colon, with contrast seen filling the ascending, descending and sigmoid colon, to the level of the rectum.  Visualized small bowel  loops are grossly unremarkable in appearance.  No free intra-abdominal air is identified, though evaluation for free air is suboptimal on a supine views.  Clips are noted within the right upper quadrant, reflecting prior cholecystectomy.  No acute osseous abnormalities are seen.  IMPRESSION: Progression of ingested barium contrast into the colon, with contrast filling the ascending, descending and sigmoid colon, to the level of the rectum.  Unremarkable bowel gas pattern; no free intra-abdominal air seen.   Original Report Authenticated By: Tonia Ghent, M.D.    Dg Chest Port 1 View  08/26/2012  *RADIOLOGY REPORT*  Clinical Data: Follow up pulmonary edema  PORTABLE CHEST - 1 VIEW  Comparison: Prior chest x-ray 08/25/2012  Findings: Stable position of left subclavian approach portacatheter.  The catheter tip is at the superior cavoatrial junction.  No significant interval change in the appearance of the chest with persistent pulmonary vascular congestion, mild edema and bibasilar pleural effusions with associated atelectasis.  Surgical changes of prior mastectomy and axillary node dissection are similar.  Stable cardiomegaly.  No pneumothorax.  No acute osseous abnormality.  IMPRESSION: No significant interval change. Stable CHF versus volume overload.   Original Report Authenticated By: Malachy Moan, M.D.     Scheduled Meds: . amiodarone  400  mg Oral BID  . antiseptic oral rinse  15 mL Mouth Rinse BID  . aspirin EC  81 mg Oral q morning - 10a  . atorvastatin  10 mg Oral QHS  . buPROPion  150 mg Oral q morning - 10a  . cefTRIAXone (ROCEPHIN)  IV  1 g Intravenous Q24H  . cholestyramine  4 g Oral BID  . docusate sodium  100 mg Oral BID  . feeding supplement  1 Container Oral BID BM  . furosemide  60 mg Intravenous Q12H  . insulin aspart  0-15 Units Subcutaneous TID WC  . letrozole  2.5 mg Oral Daily  . levothyroxine  125 mcg Oral QAC breakfast  . megestrol  400 mg Oral BID  . metoprolol  5 mg Intravenous Once  . metoprolol tartrate  50 mg Oral BID  . multivitamin with minerals  1 tablet Oral Daily  . pantoprazole  40 mg Oral Q0600  . potassium chloride  10 mEq Intravenous Q1 Hr x 5  . saccharomyces boulardii  250 mg Oral BID  . sodium chloride  3 mL Intravenous Q12H  . sodium chloride  3 mL Intravenous Q12H  . vancomycin  1,000 mg Intravenous Q12H   Continuous Infusions: . diltiazem (CARDIZEM) infusion 5 mg/hr (08/27/12 0840)    Principal Problem:   A-fib Active Problems:   HYPOTHYROIDISM   DIABETES MELLITUS, TYPE II   Morbid obesity   DEPRESSION   OBSTRUCTIVE SLEEP APNEA   GERD   DVT, HX OF   Liver disease, chronic, with cirrhosis   Cancer of breast   Hypokalemia   Obesity, unspecified   Severe malnutrition in the context of acute illness   Anasarca   UTI (lower urinary tract infection)   CHF, acute   Cellulitis of leg, left    Time spent: > 35 mins    Ermelinda Eckert C  [facility] Pager [phone]. If 7PM-7AM, please contact night-coverage at [email or web address], password [facility] 08/27/2012, 8:48 AM  LOS: 3 days

## 2012-08-27 NOTE — Progress Notes (Signed)
OT Cancellation Note  Patient Details Name: [name] MRN: [id number] DOB: Oct 19, 1955   Cancelled Treatment:    Reason Eval/Treat Not Completed: Fatigue/lethargy limiting ability to participate.  Pt is scheduled for g-tube placement later today.  Will check back another day.    Leanard Dimaio 08/27/2012, 10:03 AM [name], OTR/L [phone] 08/27/2012

## 2012-08-27 NOTE — Progress Notes (Addendum)
ANTICOAGULATION CONSULT NOTE - Follow-up Consult  Pharmacy Consult for IV Heparin Indication: Afib with RVR  Allergies  Allergen Reactions  . Olmesartan Medoxomil Cough    REACTION: ? if cough  . Tetracycline Hcl     Unknown reaction, too long for patient to remember   . Venlafaxine     REACTION: severe dry moouth  . Adhesive (Tape) Rash    Patient Measurements: Height: 5\' 4"  (162.6 cm) Weight: 203 lb 0.7 oz (92.1 kg) IBW/kg (Calculated) : 54.7 Heparin Dosing Weight: 77kg  Vital Signs: Temp: 97.5 F (36.4 C) (04/18 0800) Temp src: Oral (04/18 0800) BP: 104/57 mmHg (04/18 0912) Pulse Rate: 109 (04/18 0912)  Labs:  Recent Labs  08/24/12 1225 08/24/12 1640 08/24/12 2200 08/25/12 0400 08/25/12 1430 08/26/12 0500 08/27/12 0355 08/27/12 0824  HGB 13.1  --   --  12.5  --  12.1 11.4*  --   HCT 39.2  --   --  36.8  --  36.1 33.7*  --   PLT 136*  --   --  112*  --  120* 113*  --   APTT 38*  --   --   --   --   --   --   --   LABPROT 25.4*  --   --   --  29.4* 25.0*  --  21.0*  INR 2.44*  --   --   --  2.98* 2.39*  --  1.89*  CREATININE 0.72  --   --  0.80 0.81 0.80 0.83  --   TROPONINI  --  <0.30 <0.30 <0.30  --   --   --   --     Estimated Creatinine Clearance: 81.3 ml/min (by C-G formula based on Cr of 0.83).   Medical History: Past Medical History  Diagnosis Date  . Depression   . DVT (deep venous thrombosis)     hx of on HRT left leg ~2006  . GERD (gastroesophageal reflux disease)   . Hyperlipidemia   . Hypertension   . Hypothyroidism   . PPD positive, treated     rx inh   . OSA on CPAP   . Diabetes mellitus   . 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 Breast cancer     a. 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  PMHx significant for breast cancer  s/p mastectomy, chemotherapy with no further cycles planned, newly diagnosed Afib, CHF, malnutrition, and chronic liver disease with cirrhosis.     Cardiology consulted for Afib with RVR, started apixaban 4/15.  Apixaban d/c'd after 1 dose due to need for PEG tube placement and IV Heparin bridge started 4/15.  PEG tube placement planned for 48 hours after apixaban dose and when INR <2.    INR has been slowly rising since March and could be due to multiple factors including chronic liver disease and malnutrition.  With INR>2, heparin was d/c'd 4/15 with orders to resume once INR<2.0  Apixaban may cause elevations in INR but the one time 5 mg dose given 4/15 should be cleared by today.  Pt received PO Vitamin K x 2 days and INR 1.89 this AM.  Radiology ordered 1 unit of FFP this AM and will recheck INR in hopes to proceed with PEG tube placement today.  Per discussion with MD, will hold off on resuming heparin.  Plan:  Will follow up plan for resuming anticoagulation after probable PEG placement today.   Clance Boll, PharmD, BCPS Pager: [phone] 08/27/2012 9:56 AM

## 2012-08-27 NOTE — Progress Notes (Signed)
HR better controlled. KUB looks good as far as contrast progression. INR still 1.89 Will give 1 unit FFP and recheck INR. Hopefully will be able to proceed with G-tube today

## 2012-08-27 NOTE — Progress Notes (Signed)
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 Patient is presenting to the ED with worsening lower extremity edema. Patient was discharged from the hospital to a skilled nursing facility however however after 2-3 days at the facility patient and family were not happy with the services received at the skilled nursing facility stating that patient was left in the wheelchair all day and also patient's medications were not being given  the way they were supposed to and a such patient went home with home health services.  Home health nurse today per family daughter that patient had worsening lower extremity edema she stated that the patient sounded congested on exam and subsequently patient was sent to the emergency room. Patient denies any fever, no chills, no nausea, no vomiting, no chest pain, no shortness of breath, no abdominal pain, no diarrhea, no constipation, no dysuria, no generalized weakness.  Patient was seen in the emergency room was noted to have a heart rate in the 150s and subsequently placed on a Cardizem drip after being given a dose of IV metoprolol. Patient also noted on exam to have lower extremity edema per ED physician and 40 of Lasix were given. Chest x-ray which was done did show some infiltrates concerning for atelectasis versus infiltrate. CBC done had a normal white count. Comprehensive metabolic profile did have a sodium of 134 elevated alk phosphatase of 242 albumin of 1.6  AST of 50 ALT of 41 protein of 5.5 otherwise was within normal limits. EKG showed atrial fibrillation with RVR. Urinalysis done was nitrite positive leukocytes large WBCs with too numerous to count.   Objective:    Vital Signs:   Temp:  [97.4 F (36.3 C)-97.8 F (36.6 C)] 97.4 F (36.3 C) (04/18 0400) Pulse Rate:  [37-118] 93 (04/18 0700) Resp:  [9-21] 10 (04/18 0700) BP: (85-120)/(53-79) 94/62 mmHg (04/18 0700) SpO2:  [90 %-98 %] 93 % (04/18 0700) Weight:  [203 lb 0.7 oz (92.1 kg)] 203 lb 0.7 oz (92.1 kg) (04/18 0458)  Last BM Date: 08/24/12   24-hour weight change: Weight change:   Weight trends: Filed Weights   08/24/12 1600 08/27/12 0400 08/27/12 0458  Weight: 213 lb 13.5 oz (97 kg) 203 lb 0.7 oz (92.1 kg) 203 lb 0.7 oz (92.1 kg)    Intake/Output:  04/17 0701 - 04/18 0700 In: 1061.9 [I.V.:611.9; IV Piggyback:450] Out: 4000 [Urine:4000]     Physical Exam: BP 94/62  Pulse 93  Temp(Src) 97.4 F (36.3 C) (Axillary)  Resp 10  Ht 5\' 4"  (1.626 m)  Wt 203 lb 0.7 oz (92.1 kg)  BMI 34.84 kg/m2  SpO2 93%  General: Vital signs reviewed and noted. She is chronically ill appearing.   Head: Normocephalic, atraumatic.  Eyes: conjunctivae/corneas clear.  EOM's intact.   Throat: normal  Neck:  no JVD  Lungs:   clear  Heart:  Irreg. Irreg., HR is generally better  Abdomen:  Soft, non-tender, non-distended    Extremities: 3+ soft pitting edema   Neurologic: A&O X3, CN II - XII are grossly intact.   Psych: Normal  Labs: BMET:  Recent Labs  08/24/12 1225 08/25/12 0400  08/26/12 0500 08/27/12 0355  NA 134* 136  < > 134* 133*  K 3.6 2.7*  < > 3.2* 3.1*  CL 102 100  < > 98 97  CO2 22 25  < > 26 31  GLUCOSE 83 108*  < > 114* 101*  BUN 10 11  < > 13 14  CREATININE 0.72 0.80  < > 0.80 0.83  CALCIUM 7.5* 7.2*  < > 7.4* 7.4*  MG 1.8 1.6  --   --   --   < > = values in this interval not displayed.  Liver function tests:  Recent Labs  08/24/12 1225  08/26/12 0500  AST 50* 41*  ALT 41* 39*  ALKPHOS 242* 220*  BILITOT 0.6 0.5  PROT 5.5* 5.2*  ALBUMIN 1.6* 1.6*   No results found for this basename: LIPASE, AMYLASE,  in the last 72 hours  CBC:  Recent Labs  08/24/12 1225  08/26/12 0500 08/27/12 0355  WBC 8.4  < > 7.9 6.8  NEUTROABS 6.2  --  5.9  --   HGB 13.1  < > 12.1 11.4*  HCT 39.2  < > 36.1 33.7*  MCV 81.0  < > 80.9 81.4  PLT 136*  < > 120* 113*  < > = values in this interval not displayed.  Cardiac Enzymes:  Recent Labs  08/24/12 1640 08/24/12 2200 08/25/12 0400  TROPONINI <0.30 <0.30 <0.30    Coagulation Studies:  Recent Labs  08/24/12 1225 08/25/12 1430 08/26/12 0500  LABPROT 25.4* 29.4* 25.0*  INR 2.44* 2.98* 2.39*    Other: No components found with this basename: POCBNP,  No results found for this basename: DDIMER,  in the last 72 hours  Recent Labs  08/24/12 1225  HGBA1C 6.1*   No results found for this basename: CHOL, HDL, LDLCALC, TRIG, CHOLHDL,  in the last 72 hours  Recent Labs  08/24/12 1225  TSH 4.183   No results found for this basename: VITAMINB12, FOLATE, FERRITIN, TIBC, IRON, RETICCTPCT,  in the last 72 hours   Other results:  Tele:  Atrial fib with rate of 95-105  Medications:    Infusions: . diltiazem (CARDIZEM) infusion 5 mg/hr (08/26/12 1815)    Scheduled Medications: . amiodarone  400 mg Oral BID  . antiseptic oral rinse  15 mL Mouth Rinse BID  . aspirin EC  81 mg Oral q morning - 10a  . atorvastatin  10 mg Oral QHS  . buPROPion  150 mg Oral q morning - 10a  . cefTRIAXone (ROCEPHIN)  IV  1 g Intravenous Q24H  . cholestyramine  4 g Oral BID  . docusate sodium  100 mg Oral BID  . feeding supplement  1 Container Oral BID BM  . furosemide  60 mg Intravenous Q12H  . insulin aspart  0-15 Units Subcutaneous TID WC  . letrozole  2.5 mg Oral Daily  . levothyroxine  125 mcg Oral QAC breakfast  . megestrol  400 mg Oral BID  . metoprolol  5 mg Intravenous Once   . metoprolol tartrate  25 mg Oral BID  . multivitamin with minerals  1 tablet Oral Daily  . pantoprazole  40 mg Oral Q0600  . saccharomyces boulardii  250 mg Oral BID  . sodium chloride  3 mL Intravenous Q12H  . sodium chloride  3 mL Intravenous Q12H  . vancomycin  1,000 mg Intravenous Q12H    Assessment/ Plan:  Principal Problem:   A-fib Active Problems:   HYPOTHYROIDISM   DIABETES MELLITUS, TYPE II   Morbid obesity   DEPRESSION   OBSTRUCTIVE SLEEP APNEA   GERD   DVT, HX OF   Liver disease, chronic, with cirrhosis   Cancer of breast   Hypokalemia   Obesity, unspecified   Severe malnutrition in the context of acute illness   Anasarca   UTI (lower urinary tract infection)   CHF, acute   Cellulitis of leg, left Hypokalemia- replaced   [name] has normal LV function by echo 07/14/12 and by repeat echo yesterday .  He generalized edema is more likely to be due to her severe protein malnutrition ( albumin of 1.6) and not LV dysfunction.  She may have some degree of diastolic dysfunction which would be exacerbated by her atrial fib and by the rapid rate but her systolic function is well preserved.  I do not think her edema will resolve with diuresis alone but will take overall improvement in her condition ( especially nutritional status) to resolve.  i have decreased her Lasix frequency to Q12 HR for now.     Her HR is better on the extra metoprolol. Increase PO metoprolol to 50 BID Will DC dilt drip around noon.    Disposition:  Length of Stay: 3  Vesta Mixer, Montez Hageman., MD, [facility] 08/27/2012, 7:37 AM Office [phone] Pager [phone]

## 2012-08-27 NOTE — Progress Notes (Signed)
PT Cancellation Note  Patient Details Name: [name] MRN: [id number] DOB: [birth year]   Cancelled Treatment:    Reason Eval/Treat Not Completed: Other (comment).  Pt declined OOB due to anxiety about getting PEG tube today.  Will check back, if time, over weekend.  If not, will follow on Monday.    Thanks,    Vista Deck 08/27/2012, 10:13 AM

## 2012-08-27 NOTE — Progress Notes (Signed)
Cardizem drip discontinued at noon, per order.  The patient's heart rate became more elevated, with a rate sustaining around 120 and occasionally reaching to 140bpm at 1800.  [facility] PA was paged regarding the elevated heart rate.  Patient remained alert and oriented and blood pressure remained stable.  New orders for Cardizem given.  Rhonda Feil, RN

## 2012-08-27 NOTE — Progress Notes (Signed)
Pt c/o pain to LUQ that is "sore".  TRH1 PA notified since pt cannot take po's at this time.  Orders rec'd.  Also pt has been having 3-5 beat runs PVCs over the last hour.  Cardiology PA notified and labs ordered.  Continue to monitor.

## 2012-08-28 DIAGNOSIS — K769 Liver disease, unspecified: Secondary | ICD-10-CM

## 2012-08-28 LAB — GLUCOSE, CAPILLARY
Glucose-Capillary: 76 mg/dL (ref 70–99)
Glucose-Capillary: 104 mg/dL — ABNORMAL HIGH (ref 70–99)

## 2012-08-28 LAB — PROTIME-INR
INR: 1.84 — ABNORMAL HIGH (ref 0.00–1.49)
Prothrombin Time: 20.6 seconds — ABNORMAL HIGH (ref 11.6–15.2)

## 2012-08-28 LAB — BASIC METABOLIC PANEL
Calcium: 7.5 mg/dL — ABNORMAL LOW (ref 8.4–10.5)
GFR calc Af Amer: >90 mL/min (ref >90)
GFR calc non Af Amer: >90 mL/min (ref >90)
Potassium: 3.3 mEq/L — ABNORMAL LOW (ref 3.5–5.1)
Sodium: 133 mEq/L — ABNORMAL LOW (ref 135–145)

## 2012-08-28 MED ORDER — DIGOXIN 0.25 MG/ML IJ SOLN
0.2500 mg | Freq: Four times a day (QID) | INTRAMUSCULAR | Status: AC
  Administered 2012-08-29 (×2): 0.25 mg via INTRAVENOUS
  Filled 2012-08-28 (×2): qty 1

## 2012-08-28 MED ORDER — HEPARIN (PORCINE) IN NACL 100-0.45 UNIT/ML-% IJ SOLN
700.0000 [IU]/h | INTRAMUSCULAR | Status: DC
  Administered 2012-08-29: 700 [IU]/h via INTRAVENOUS
  Filled 2012-08-28 (×2): qty 250

## 2012-08-28 MED ORDER — POTASSIUM CHLORIDE 20 MEQ/15ML (10%) PO LIQD
40.0000 meq | Freq: Two times a day (BID) | ORAL | Status: AC
  Administered 2012-08-28 (×2): 40 meq via ORAL
  Filled 2012-08-28 (×2): qty 30

## 2012-08-28 MED ORDER — JEVITY 1.2 CAL PO LIQD
1000.0000 mL | ORAL | Status: DC
  Administered 2012-08-28: 1000 mL

## 2012-08-28 MED ORDER — DOCUSATE SODIUM 50 MG/5ML PO LIQD
100.0000 mg | Freq: Two times a day (BID) | ORAL | Status: DC
  Administered 2012-08-28 – 2012-09-03 (×12): 100 mg
  Filled 2012-08-28 (×16): qty 10

## 2012-08-28 MED ORDER — DIGOXIN 0.25 MG/ML IJ SOLN
0.5000 mg | Freq: Once | INTRAMUSCULAR | Status: AC
  Administered 2012-08-28: 0.5 mg via INTRAVENOUS
  Filled 2012-08-28 (×2): qty 2

## 2012-08-28 MED ORDER — BELLADONNA ALKALOIDS-OPIUM 16.2-60 MG RE SUPP: 1.0000 | Freq: Four times a day (QID) | RECTAL | Status: DC | PRN

## 2012-08-28 MED ORDER — DIGOXIN 0.25 MG/ML IJ SOLN
0.2500 mg | Freq: Every day | INTRAMUSCULAR | Status: DC
  Filled 2012-08-28 (×2): qty 1

## 2012-08-28 NOTE — Progress Notes (Signed)
Pt anxious to start tube feeds as planned today. Dietary consult submitted at noon. Left two messages on dietary voice mail for call back concerning this patient.  Pt refuses to be repositioned to alleviate pressure on wounds. Refused bath or linen change. Agreed to [facility] care but was very dissatisfied/uncomfortable afterwards. Administered 1 Percocet tab. Will continue to reassess pain.

## 2012-08-28 NOTE — Progress Notes (Signed)
Pt has had multiple occasions of 3-5 beats of Vtach throughout the day. Cardiologist is aware. Will continue to monitor.

## 2012-08-28 NOTE — Progress Notes (Signed)
ANTIBIOTIC CONSULT NOTE - FOLLOW UP  Pharmacy Consult for Vancomycin Indication: LLE Cellulitis  Allergies  Allergen Reactions  . Olmesartan Medoxomil Cough    REACTION: ? if cough  . Tetracycline Hcl     Unknown reaction, too long for patient to remember   . Venlafaxine     REACTION: severe dry moouth  . Adhesive (Tape) Rash    Patient Measurements: Height: 5\' 4"  (162.6 cm) Weight: 205 lb 4 oz (93.1 kg) IBW/kg (Calculated) : 54.7  Vital Signs: Temp: 98 F (36.7 C) (04/19 1213) Temp src: Oral (04/19 1200) BP: 97/53 mmHg (04/19 1213) Pulse Rate: 126 (04/19 1213) Intake/Output from previous day: 04/18 0701 - 04/19 0700 In: 2396.8 [I.V.:784.3; Blood:332.5; IV Piggyback:1050] Out: 2155 [Urine:2155] Intake/Output from this shift: Total I/O In: 110 [I.V.:110] Out: 120 [Urine:120]  Labs:  Recent Labs  08/26/12 0500 08/27/12 0355 08/28/12 0525  WBC 7.9 6.8  --   HGB 12.1 11.4*  --   PLT 120* 113*  --   CREATININE 0.80 0.83 0.76   Estimated Creatinine Clearance: 84.8 ml/min (by C-G formula based on Cr of 0.76).  Recent Labs  08/28/12 1124  VANCOTROUGH 27.7*     Microbiology: 4/15 urine culture: klebsiella pneumoniae (S- cefazolin, ceftriaxone, cipro, gent, levaquin, zosyn, tobra, septra; R- ampicillin, I- NTF) 4/15 MRSA PCR (-)  Assessment: 41 yoF admitted with acute exacerbation CHF, with hx breast CA, severe malnutrition now s/p PEG placement, new Afib, also found to have LLE cellulitis and UTI.   Day #4 vancomycin 2g x 1 LD then 1g IV q12h.  Day #5 ceftriaxone 1g q24h.  Urine culture sensitive to ceftriaxone.  Renal function stable, CrCl ~84 ml/min (CG), ~91 ml/min (normalized)  Vancomycin trough drawn this AM was supratherapeutic.  Pharmacy instructed RN to hang vancomycin dose following trough drawn with anticipation that VT would be close to therapeutic since previous VT was therapeutic on current dosing and renal function has not changed.   Vancomycin trough 3/30 = 14.5 on 1 gram IV q 12 hour dosing.    RN notified pharmacy of high VT and vancomycin dose was stopped.   Goal of Therapy:  Vancomycin trough level 10-15 mcg/ml  Plan:  Hold vancomycin.   Will check vancomycin level in AM and re-dose based on level.  Clance Boll 08/28/2012,1:16 PM

## 2012-08-28 NOTE — Progress Notes (Signed)
Pharmacy: Heparin   Heparin level (1.03)  Above goal 0.3-0.7 on heparin 1000 units/hr.   Plan 1.) Reduce heparin to 700 units/hr 2.) Repeat heparin level at 0100 (6 hr after rate change) 3.) f/u daily CBC and heparin level  Albena Comes, Loma Messing PharmD Pager #: [phone] 7:10 PM 08/28/2012

## 2012-08-28 NOTE — Progress Notes (Signed)
Subjective: Pt c/o mild soreness at  G tube site; denies CP/dyspnea; in afib with HR120-130  Objective: Vital signs in last 24 hours: Temp:  [97.5 F (36.4 C)-98.1 F (36.7 C)] 98 F (36.7 C) (04/19 1213) Pulse Rate:  [53-136] 126 (04/19 1213) Resp:  [10-23] 10 (04/19 1213) BP: (84-115)/(47-85) 97/53 mmHg (04/19 1213) SpO2:  [91 %-99 %] 98 % (04/19 1213) FiO2 (%):  [2 %] 2 % (04/18 1509) Weight:  [205 lb 4 oz (93.1 kg)] 205 lb 4 oz (93.1 kg) (04/19 0500) Last BM Date: 08/27/12  Intake/Output from previous day: 04/18 0701 - 04/19 0700 In: 2396.8 [I.V.:784.3; Blood:332.5; IV Piggyback:1050] Out: 2155 [Urine:2155] Intake/Output this shift: Total I/O In: 110 [I.V.:110] Out: 120 [Urine:120]  Gastrostomy tube intact, insertion site clean and dry, mildly tender, abd soft,ND  Lab Results:   Recent Labs  08/26/12 0500 08/27/12 0355  WBC 7.9 6.8  HGB 12.1 11.4*  HCT 36.1 33.7*  PLT 120* 113*   BMET  Recent Labs  08/27/12 0355 08/27/12 2113 08/28/12 0525  NA 133*  --  133*  K 3.1* 2.9* 3.3*  CL 97  --  98  CO2 31  --  30  GLUCOSE 101*  --  79  BUN 14  --  14  CREATININE 0.83  --  0.76  CALCIUM 7.4*  --  7.5*   PT/INR  Recent Labs  08/27/12 1415 08/28/12 0525  LABPROT 18.7* 20.6*  INR 1.62* 1.84*   ABG No results found for this basename: PHART, PCO2, PO2, HCO3,  in the last 72 hours  Studies/Results: Dg Abd 1 View  08/27/2012  *RADIOLOGY REPORT*  Clinical Data: Check progression of barium contrast.  ABDOMEN - 1 VIEW  Comparison: Abdominal radiograph performed 08/03/2012  Findings: Ingested barium contrast has progressed into the colon, with contrast seen filling the ascending, descending and sigmoid colon, to the level of the rectum.  Visualized small bowel loops are grossly unremarkable in appearance.  No free intra-abdominal air is identified, though evaluation for free air is suboptimal on a supine views.  Clips are noted within the right upper  quadrant, reflecting prior cholecystectomy.  No acute osseous abnormalities are seen.  IMPRESSION: Progression of ingested barium contrast into the colon, with contrast filling the ascending, descending and sigmoid colon, to the level of the rectum.  Unremarkable bowel gas pattern; no free intra-abdominal air seen.   Original Report Authenticated By: Tonia Ghent, M.D.    Ir Gastrostomy Tube Mod Sed  08/27/2012  *RADIOLOGY REPORT*  Clinical Data:  Breast carcinoma, malnutrition and failure to thrive.  PERCUTANEOUS GASTROSTOMY TUBE PLACEMENT  Sedation:  0.5 mg IV Versed  Total Moderate Sedation Time: 10 minutes.  Contrast:  20 ml Omnipaque-300  Fluoroscopy Time: 2 minutes and 42 seconds.  Procedure:  The procedure, risks, benefits, and alternatives were explained to the patient.  Questions regarding the procedure were encouraged and answered.  The patient understands and consents to the procedure.  The evening prior to the procedure, the patient was given thin liquid barium to ingest in order to opacify the colon.  A 5-French catheter was then advanced through the patient's mouth under fluoroscopy into the esophagus and to the level of the stomach. This catheter was used to insufflate the stomach with air under fluoroscopy.  The abdominal wall was prepped with Betadine in a sterile fashion, and a sterile drape was applied covering the operative field.  A sterile gown and sterile gloves were used for the procedure.  Local anesthesia was provided with 1% Lidocaine.  A skin incision was made in the upper abdominal wall.  Under fluoroscopy, an 18 gauge trocar needle was advanced into the stomach.  Contrast injection was performed to confirm intraluminal position of the needle tip.  A single T tack was then deployed in the lumen of the stomach.  This was brought up to tension at the skin surface.  Over a guidewire, a 9-French sheath was advanced into the lumen of the stomach.  The wire was left in place as a safety  wire.  A loop snare device from a percutaneous gastrostomy kit was then advanced into the stomach.  A floppy guide wire was advanced through the orogastric catheter under fluoroscopy in the stomach.  The loop snare advanced through the percutaneous gastric access was used to snare the guide wire. This allowed withdrawal of the loop snare out of the patient's mouth by retraction of the orogastric catheter and wire.  A 20-French bumper retention gastrostomy tube was looped around the snare device.  It was then pulled back through the patient's mouth. The retention bumper was brought up to the anterior gastric wall. The T tack suture was cut at the skin.  The exiting gastrostomy tube was cut to appropriate length and a feeding adapter applied. The catheter was injected with contrast material to confirm position and a fluoroscopic spot image saved.  The tube was then flushed with saline.  A dressing was applied over the gastrostomy exit site.  Complications:  None  Findings:  Initial fluoroscopy demonstrates adequate opacification of the colon by ingested barium in order to prevent colonic injury during the procedure.  The stomach distended well with air allowing safe placement of the gastrostomy tube.  After placement, the tip of the gastrostomy tube lies in the body of the stomach.  IMPRESSION: Percutaneous gastrostomy with placement of a 20-French bumper retention tube in the body of the stomach.  This tube can be used for percutaneous feeds beginning in 24 hours after placement.   Original Report Authenticated By: Irish Lack, M.D.     Anti-infectives: Anti-infectives   Start     Dose/Rate Route Frequency Ordered Stop   08/25/12 2300  vancomycin (VANCOCIN) IVPB 1000 mg/200 mL premix  Status:  Discontinued     1,000 mg 200 mL/hr over 60 Minutes Intravenous Every 12 hours 08/25/12 1011 08/28/12 1301   08/25/12 1100  vancomycin (VANCOCIN) 2,000 mg in sodium chloride 0.9 % 500 mL IVPB     2,000 mg 250  mL/hr over 120 Minutes Intravenous  Once 08/25/12 1011 08/25/12 1355   08/24/12 1545  cefTRIAXone (ROCEPHIN) 1 g in dextrose 5 % 50 mL IVPB     1 g 100 mL/hr over 30 Minutes Intravenous Every 24 hours 08/24/12 1536     08/24/12 1315  cefTRIAXone (ROCEPHIN) 1 g in dextrose 5 % 50 mL IVPB     1 g 100 mL/hr over 30 Minutes Intravenous  Once 08/24/12 1311 08/24/12 1537      Assessment/Plan: S/p perc gastrostomy tube 4/18; ok to use tube as needed. Replace K.  LOS: 4 days    Harmon Bommarito,D [facility] 08/28/2012

## 2012-08-28 NOTE — ED Provider Notes (Signed)
Medical screening examination/treatment/procedure(s) were conducted as a shared visit with non-physician practitioner(s) and myself.  I personally evaluated the patient during the encounter.  Please see completed note for this encounter.   Raeford Razor, MD 08/28/12 [phone]

## 2012-08-28 NOTE — Progress Notes (Signed)
Received critical vanc 27.7. Dr. Suanne Marker paged and pharmacy notified. Stopped current vanc dose per pharmacy.

## 2012-08-28 NOTE — Progress Notes (Signed)
ANTICOAGULATION CONSULT NOTE - Follow-up Consult  Pharmacy Consult for IV Heparin Indication: Afib with RVR  Allergies  Allergen Reactions  . Olmesartan Medoxomil Cough    REACTION: ? if cough  . Tetracycline Hcl     Unknown reaction, too long for patient to remember   . Venlafaxine     REACTION: severe dry moouth  . Adhesive (Tape) Rash    Patient Measurements: Height: 5\' 4"  (162.6 cm) Weight: 205 lb 4 oz (93.1 kg) IBW/kg (Calculated) : 54.7 Heparin Dosing Weight: 66kg  Vital Signs: Temp: 98 F (36.7 C) (04/19 1213) Temp src: Oral (04/19 1200) BP: 97/53 mmHg (04/19 1213) Pulse Rate: 126 (04/19 1213)  Labs:  Recent Labs  08/26/12 0500 08/27/12 0355 08/27/12 0824 08/27/12 1415 08/28/12 0525 08/28/12 1123  HGB 12.1 11.4*  --   --   --   --   HCT 36.1 33.7*  --   --   --   --   PLT 120* 113*  --   --   --   --   LABPROT 25.0*  --  21.0* 18.7* 20.6*  --   INR 2.39*  --  1.89* 1.62* 1.84*  --   HEPARINUNFRC  --   --   --   --   --  0.30  CREATININE 0.80 0.83  --   --  0.76  --     Estimated Creatinine Clearance: 84.8 ml/min (by C-G formula based on Cr of 0.76).   Medical History: Past Medical History  Diagnosis Date  . Depression   . DVT (deep venous thrombosis)     hx of on HRT left leg ~2006  . GERD (gastroesophageal reflux disease)   . Hyperlipidemia   . Hypertension   . Hypothyroidism   . PPD positive, treated     rx inh   . OSA on CPAP   . Diabetes mellitus   . 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 Breast cancer     a. 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  PMHx significant for breast cancer s/p mastectomy, chemotherapy with no further cycles planned, newly diagnosed Afib, CHF, malnutrition, and chronic liver disease with cirrhosis.      Cardiology consulted for Afib with RVR, started apixaban 4/15.  Apixaban d/c'd after 1 dose due to need for PEG tube placement and IV Heparin bridge started 4/15.  PEG tube placement planned for 48 hours after apixaban dose and when INR <2.    INR has been slowly rising since March and could be due to multiple factors including chronic liver disease and malnutrition.  With INR>2, heparin was d/c'd 4/15 with orders to resume once INR<2.0  4/18 INR 1.62 following PO Vitamin K x 2 days and 1 unit FFP in AM.  Radiology proceeded with PEG placement.  Post-op PEG placement, pharmacy received v/o order from radiology by RN to restart IV heparin 4/19 am per Rx.  Heparin restarted at 1000 units/hr and first heparin level at low end of therapeutic range at 0.30.    CBC ok, platelets chronically low and relatively stable.  Plan:   Continue IV heparin @ 1000 units/hr.  Check HL @ 1800.  F/U plans for long-term anticoagulation.  Cardiology recommends keeping on heparin for possible cardioversion.  Apixaban not an option  until know PEG is well functioning.   Clance Boll 08/28/2012 1:07 PM

## 2012-08-28 NOTE — Progress Notes (Signed)
Pt BP 84/47 (59).  PA notified.  Will watch and monitor for now.  Cardizem drip off since 0030.

## 2012-08-28 NOTE — Progress Notes (Signed)
[facility] PROGRESS NOTE  EDYTH GLOMB XBJ:[id number] DOB: 01-01-1956 DOA: 08/24/2012 PCP: [name]., NP  Assessment/Plan: #1 A. fib with RVR Questionable etiology. May be secondary to stress and severe malnutrition and ongoing cancer. Cardiac enzymes are negative x3. Pro BNP is elevated. 2-D echo is pending.  -Patient was started on eliquis for anticoagulation per cardiology in anticipation of possible cardioversion, but eliquis was dc'ed and pt initially placed patient on full dose IV heparin in anticipation of possible PEG tube for her severe malnutrition, but due elevated INR, the heparin was dc'ed pending INR dropping to <2 then it will be resumed. -INR is1.89 on 4/18 following vit k, discussed with IR- she was given FFP to bring INR closer to 1.5  and PEG placement 4/18  -continue amiodarone and metoprolol per cards, cardizem drip was dc'ed 4/18 and plan per cards is to start dig  Better HR control once k repleted.   -some hypotension this a.m., monitor and hold antihypertensives as appropriate, holding off IVF given her anasarca -deferring further anticoagulation after PEG to cardiology, -appreciate the input and recommendations.   #2  Anarsarca/acute CHF exacerbation Likely secondary to severe malnutrition in the setting of afib with RVR likely leading to acute CHF exacerbation with pulmonary edema. Patient's albumin is 1.6. Chest x-ray consistent with pulmonary edema. - continue current IV Lasix.   -continue Strict I.'s and O.'s. Daily weights.  - 2-D echo with nl EF 55-60%, no wall motion abnormalities.  -Cardiology is following and appreciate assistance  #3 kleb urinary tract infection -kleb sensitive to Rocephin, will continue.  #4 left lower extremity cellulitis Continue IV Rocephin and IV vancomycin.  #5 gastroesophageal reflux disease Continue PPI.  #6 type 2 diabetes Hemoglobin A1c 6.1. Continue sliding scale insulin.  #7 severe malnutrition/PEG  placement Patient with 6-8 weeks of poor oral intake, decreased appetite. Megace started 4/15.  - s/p PEG tube placement 4/18 as above, consult nutrition for tube feedings  -Discussed with patient's oncologist Dr. [name]- per Dr.Thompson  #8 history of breast cancer status post right mastectomy Pathologic staging PET 3, P. and 1, stage IIIa. ER and PR positive at 100%, HER -2/neu negative. Per oncology.  #9 chronic liver disease -Continue diuretics. -contributing to likely underlying coagulopathy  #10 hypokalemia Secondary to diuretics.  -Replace K.  #11 prophylaxis PPI for GI prophylaxis. Heparin for DVT prophylaxis.     Code Status: Full Family Communication: Updated patient and husband at bedside. Disposition Plan: Home when medically stable.   Consultants:  Cardiology: Dr. Graciela Husbands 08/24/2012  Oncology Dr. [name] 08/24/2012  Procedures:  2-D echo  08/26/2038  Chest x-ray 08/24/2012, 08/25/2012  Antibiotics:  IV Rocephin 08/24/12  IV Vancomycin 08/25/12  HPI/Subjective: patient denies chest pain, no shortness of breath. Husband at bedside Objective: Filed Vitals:   08/28/12 0500 08/28/12 0600 08/28/12 0700 08/28/12 0800  BP: 91/62 93/63 85/60  99/47  Pulse: 98 106 101 97  Temp:      TempSrc:      Resp: 16 16 13 18   Height:      Weight: 93.1 kg (205 lb 4 oz)     SpO2: 97% 98% 98% 99%    Intake/Output Summary (Last 24 hours) at 08/28/12 0838 Last data filed at 08/28/12 0800  Gross per 24 hour  Intake 2406.83 ml  Output   2155 ml  Net 251.83 ml   Filed Weights   08/27/12 0400 08/27/12 0458 08/28/12 0500  Weight: 92.1 kg (203 lb 0.7 oz) 92.1 kg (  203 lb 0.7 oz) 93.1 kg (205 lb 4 oz)    Exam:   General:  NAD. Alert and orientedx3  Cardiovascular: Irregularly irregular, rate better controlled  Respiratory:decreased BS at bases, no crackles and no wheezes  Abdomen: Soft/NT/ND/+BS  Extremities: No c/c. +3 BLE edema to hips. Left lower leg  near the ankle with decreased area of erythema.  Data Reviewed: Basic Metabolic Panel:  Recent Labs Lab 08/24/12 1225 08/25/12 0400 08/25/12 1430 08/26/12 0500 08/27/12 0355 08/27/12 2113 08/28/12 0525  NA 134* 136 132* 134* 133*  --  133*  K 3.6 2.7* 3.0* 3.2* 3.1* 2.9* 3.3*  CL 102 100 97 98 97  --  98  CO2 22 25 25 26 31   --  30  GLUCOSE 83 108* 116* 114* 101*  --  79  BUN 10 11 12 13 14   --  14  CREATININE 0.72 0.80 0.81 0.80 0.83  --  0.76  CALCIUM 7.5* 7.2* 7.3* 7.4* 7.4*  --  7.5*  MG 1.8 1.6  --   --   --  1.7  --    Liver Function Tests:  Recent Labs Lab 08/24/12 1225 08/26/12 0500  AST 50* 41*  ALT 41* 39*  ALKPHOS 242* 220*  BILITOT 0.6 0.5  PROT 5.5* 5.2*  ALBUMIN 1.6* 1.6*   No results found for this basename: LIPASE, AMYLASE,  in the last 168 hours No results found for this basename: AMMONIA,  in the last 168 hours CBC:  Recent Labs Lab 08/24/12 1225 08/25/12 0400 08/26/12 0500 08/27/12 0355  WBC 8.4 7.5 7.9 6.8  NEUTROABS 6.2  --  5.9  --   HGB 13.1 12.5 12.1 11.4*  HCT 39.2 36.8 36.1 33.7*  MCV 81.0 81.4 80.9 81.4  PLT 136* 112* 120* 113*   Cardiac Enzymes:  Recent Labs Lab 08/24/12 1640 08/24/12 2200 08/25/12 0400  TROPONINI <0.30 <0.30 <0.30   BNP (last 3 results)  Recent Labs  08/24/12 1225 08/25/12 0400 08/26/12 0500  PROBNP 1816.0* 1915.0* 1184.0*   CBG:  Recent Labs Lab 08/26/12 2229 08/27/12 0748 08/27/12 1255 08/27/12 1645 08/27/12 2318  GLUCAP 109* 90 113* 101* 81    Recent Results (from the past 240 hour(s))  URINE CULTURE     Status: None   Collection Time    08/24/12 12:05 PM      Result Value Range Status   Specimen Description URINE, CLEAN CATCH   Final   Special Requests NONE   Final   Culture  Setup Time 08/24/2012 22:00   Final   Colony Count >=100,000 COLONIES/ML   Final   Culture KLEBSIELLA PNEUMONIAE   Final   Report Status 08/27/2012 FINAL   Final   Organism ID, Bacteria KLEBSIELLA  PNEUMONIAE   Final  MRSA PCR SCREENING     Status: None   Collection Time    08/24/12  4:00 PM      Result Value Range Status   MRSA by PCR NEGATIVE  NEGATIVE Final   Comment:            The GeneXpert MRSA Assay (FDA     approved for NASAL specimens     only), is one component of a     comprehensive MRSA colonization     surveillance program. It is not     intended to diagnose MRSA     infection nor to guide or     monitor treatment for     MRSA infections.  Studies: Dg Abd 1 View  08/27/2012  *RADIOLOGY REPORT*  Clinical Data: Check progression of barium contrast.  ABDOMEN - 1 VIEW  Comparison: Abdominal radiograph performed 08/03/2012  Findings: Ingested barium contrast has progressed into the colon, with contrast seen filling the ascending, descending and sigmoid colon, to the level of the rectum.  Visualized small bowel loops are grossly unremarkable in appearance.  No free intra-abdominal air is identified, though evaluation for free air is suboptimal on a supine views.  Clips are noted within the right upper quadrant, reflecting prior cholecystectomy.  No acute osseous abnormalities are seen.  IMPRESSION: Progression of ingested barium contrast into the colon, with contrast filling the ascending, descending and sigmoid colon, to the level of the rectum.  Unremarkable bowel gas pattern; no free intra-abdominal air seen.   Original Report Authenticated By: Tonia Ghent, M.D.    Ir Gastrostomy Tube Mod Sed  08/27/2012  *RADIOLOGY REPORT*  Clinical Data:  Breast carcinoma, malnutrition and failure to thrive.  PERCUTANEOUS GASTROSTOMY TUBE PLACEMENT  Sedation:  0.5 mg IV Versed  Total Moderate Sedation Time: 10 minutes.  Contrast:  20 ml Omnipaque-300  Fluoroscopy Time: 2 minutes and 42 seconds.  Procedure:  The procedure, risks, benefits, and alternatives were explained to the patient.  Questions regarding the procedure were encouraged and answered.  The patient understands and consents  to the procedure.  The evening prior to the procedure, the patient was given thin liquid barium to ingest in order to opacify the colon.  A 5-French catheter was then advanced through the patient's mouth under fluoroscopy into the esophagus and to the level of the stomach. This catheter was used to insufflate the stomach with air under fluoroscopy.  The abdominal wall was prepped with Betadine in a sterile fashion, and a sterile drape was applied covering the operative field.  A sterile gown and sterile gloves were used for the procedure. Local anesthesia was provided with 1% Lidocaine.  A skin incision was made in the upper abdominal wall.  Under fluoroscopy, an 18 gauge trocar needle was advanced into the stomach.  Contrast injection was performed to confirm intraluminal position of the needle tip.  A single T tack was then deployed in the lumen of the stomach.  This was brought up to tension at the skin surface.  Over a guidewire, a 9-French sheath was advanced into the lumen of the stomach.  The wire was left in place as a safety wire.  A loop snare device from a percutaneous gastrostomy kit was then advanced into the stomach.  A floppy guide wire was advanced through the orogastric catheter under fluoroscopy in the stomach.  The loop snare advanced through the percutaneous gastric access was used to snare the guide wire. This allowed withdrawal of the loop snare out of the patient's mouth by retraction of the orogastric catheter and wire.  A 20-French bumper retention gastrostomy tube was looped around the snare device.  It was then pulled back through the patient's mouth. The retention bumper was brought up to the anterior gastric wall. The T tack suture was cut at the skin.  The exiting gastrostomy tube was cut to appropriate length and a feeding adapter applied. The catheter was injected with contrast material to confirm position and a fluoroscopic spot image saved.  The tube was then flushed with saline.  A  dressing was applied over the gastrostomy exit site.  Complications:  None  Findings:  Initial fluoroscopy demonstrates adequate opacification of the colon by  ingested barium in order to prevent colonic injury during the procedure.  The stomach distended well with air allowing safe placement of the gastrostomy tube.  After placement, the tip of the gastrostomy tube lies in the body of the stomach.  IMPRESSION: Percutaneous gastrostomy with placement of a 20-French bumper retention tube in the body of the stomach.  This tube can be used for percutaneous feeds beginning in 24 hours after placement.   Original Report Authenticated By: Irish Lack, M.D.     Scheduled Meds: . amiodarone  400 mg Oral BID  . antiseptic oral rinse  15 mL Mouth Rinse BID  . aspirin EC  81 mg Oral q morning - 10a  . atorvastatin  10 mg Oral QHS  . buPROPion  150 mg Oral q morning - 10a  . cefTRIAXone (ROCEPHIN)  IV  1 g Intravenous Q24H  . cholestyramine  4 g Oral BID  . docusate sodium  100 mg Oral BID  . feeding supplement  1 Container Oral BID BM  . furosemide  60 mg Intravenous Q12H  . insulin aspart  0-15 Units Subcutaneous TID WC  . letrozole  2.5 mg Oral Daily  . levothyroxine  125 mcg Oral QAC breakfast  . megestrol  400 mg Oral BID  . metoprolol  5 mg Intravenous Once  . metoprolol tartrate  50 mg Oral BID  . multivitamin with minerals  1 tablet Oral Daily  . pantoprazole  40 mg Oral Q0600  . potassium chloride  40 mEq Oral BID  . saccharomyces boulardii  250 mg Oral BID  . sodium chloride  3 mL Intravenous Q12H  . sodium chloride  3 mL Intravenous Q12H  . vancomycin  1,000 mg Intravenous Q12H   Continuous Infusions: . diltiazem (CARDIZEM) infusion Stopped (08/28/12 0807)  . heparin 1,000 Units/hr (08/28/12 1610)    Principal Problem:   A-fib Active Problems:   HYPOTHYROIDISM   DIABETES MELLITUS, TYPE II   Morbid obesity   DEPRESSION   OBSTRUCTIVE SLEEP APNEA   GERD   DVT, HX OF   Liver  disease, chronic, with cirrhosis   Cancer of breast   Hypokalemia   Obesity, unspecified   Severe malnutrition in the context of acute illness   Anasarca   UTI (lower urinary tract infection)   CHF, acute   Cellulitis of leg, left    Time spent: > 35 mins    Syliva Mee C  [facility] Pager [phone]. If 7PM-7AM, please contact night-coverage at [email or web address], password [facility] 08/28/2012, 8:38 AM  LOS: 4 days

## 2012-08-28 NOTE — Progress Notes (Signed)
CRITICAL VALUE ALERT  Critical value received:  vanc 27.7  Date of notification:  08/28/2012  Time of notification:  1300  Critical value read back: yes  Nurse who received alert:  Delanna Notice, RN  MD notified (1st page):  Dr. Suanne Marker  Time of first page:  1300  MD notified (2nd page):  Time of second page:  Responding MD:  Dr. Suanne Marker  Time MD responded:  [phone]

## 2012-08-28 NOTE — Progress Notes (Addendum)
Subjective:  Says she feels reasonably well. Not SOB, no chest pain.  Had PEG tube put in yesterday.   Objective:  Vital Signs in the last 24 hours: BP 99/47  Pulse 97  Temp(Src) 97.6 F (36.4 C) (Oral)  Resp 18  Ht 5\' 4"  (1.626 m)  Wt 93.1 kg (205 lb 4 oz)  BMI 35.21 kg/m2  SpO2 99%  Physical Exam: Lying in bed in NAD Lungs:  Clear  Cardiac:  Rapid irregular rhythm, normal S1 and S2, no S3 Abdomen:  PEG in place Extremities:  3+ edema present  Intake/Output from previous day: 04/18 0701 - 04/19 0700 In: 2372.2 [I.V.:759.7; Blood:332.5; IV Piggyback:1050] Out: 2155 [Urine:2155] Weight Filed Weights   08/27/12 0400 08/27/12 0458 08/28/12 0500  Weight: 92.1 kg (203 lb 0.7 oz) 92.1 kg (203 lb 0.7 oz) 93.1 kg (205 lb 4 oz)    Lab Results: Basic Metabolic Panel:  Recent Labs  09/81/19 0355 08/27/12 2113 08/28/12 0525  NA 133*  --  133*  K 3.1* 2.9* 3.3*  CL 97  --  98  CO2 31  --  30  GLUCOSE 101*  --  79  BUN 14  --  14  CREATININE 0.83  --  0.76    CBC:  Recent Labs  08/26/12 0500 08/27/12 0355  WBC 7.9 6.8  NEUTROABS 5.9  --   HGB 12.1 11.4*  HCT 36.1 33.7*  MCV 80.9 81.4  PLT 120* 113*    BNP    Component Value Date/Time   PROBNP 1184.0* 08/26/2012 0500    PROTIME: Lab Results  Component Value Date   INR 1.84* 08/28/2012   INR 1.62* 08/27/2012   INR 1.89* 08/27/2012    Telemetry:  Atrial fib with rates still rapid at times on amiodarone and metoprolol.  Some PVC's versus aberrancy noted  Assessment/Plan:  1. Persistent atrial fibrillation with RVR 2. Hypokalemia  Rec:  Need to replete K and Magnesium.  Once K up might consider use of dig to help with rate control  Would also not start Eliquus back until we know PEG is well functioning.  Keep on heparin for possible cardioversion.     Darden Palmer  MD [facility] Card[facility]  08/28/2012, 8:04 AM

## 2012-08-29 DIAGNOSIS — N289 Disorder of kidney and ureter, unspecified: Secondary | ICD-10-CM

## 2012-08-29 LAB — CBC
Hemoglobin: 11.1 g/dL — ABNORMAL LOW (ref 12.0–15.0)
MCH: 27.3 pg (ref 26.0–34.0)
RBC: 4.07 MIL/uL (ref 3.87–5.11)

## 2012-08-29 LAB — GLUCOSE, CAPILLARY: Glucose-Capillary: 82 mg/dL (ref 70–99)

## 2012-08-29 LAB — BASIC METABOLIC PANEL
BUN: 14 mg/dL (ref 6–23)
CO2: 29 mEq/L (ref 19–32)
Chloride: 100 mEq/L (ref 96–112)
Creatinine, Ser: 0.81 mg/dL (ref 0.50–1.10)
Glucose, Bld: 96 mg/dL (ref 70–99)

## 2012-08-29 LAB — HEPARIN LEVEL (UNFRACTIONATED)
Heparin Unfractionated: 0.32 IU/mL (ref 0.30–0.70)
Heparin Unfractionated: 0.26 IU/mL — ABNORMAL LOW (ref 0.30–0.70)

## 2012-08-29 LAB — MAGNESIUM: Magnesium: 1.8 mg/dL (ref 1.5–2.5)

## 2012-08-29 MED ORDER — VANCOMYCIN HCL IN DEXTROSE 750-5 MG/150ML-% IV SOLN
750.0000 mg | Freq: Two times a day (BID) | INTRAVENOUS | Status: DC
  Administered 2012-08-29 – 2012-09-03 (×11): 750 mg via INTRAVENOUS
  Filled 2012-08-29 (×12): qty 150

## 2012-08-29 MED ORDER — PANTOPRAZOLE SODIUM 40 MG PO PACK
40.0000 mg | PACK | Freq: Every day | ORAL | Status: DC
  Administered 2012-08-29 – 2012-09-06 (×9): 40 mg
  Filled 2012-08-29 (×12): qty 20

## 2012-08-29 MED ORDER — HEPARIN (PORCINE) IN NACL 100-0.45 UNIT/ML-% IJ SOLN
1000.0000 [IU]/h | INTRAMUSCULAR | Status: DC
  Administered 2012-08-30: 1000 [IU]/h via INTRAVENOUS
  Filled 2012-08-29: qty 250

## 2012-08-29 MED ORDER — JEVITY 1.2 CAL PO LIQD
1000.0000 mL | ORAL | Status: DC
  Administered 2012-08-29 – 2012-09-01 (×5): 1000 mL
  Filled 2012-08-29: qty 1000

## 2012-08-29 MED ORDER — PRO-STAT SUGAR FREE PO LIQD
30.0000 mL | Freq: Every day | ORAL | Status: DC
  Administered 2012-08-29 – 2012-08-31 (×3): 30 mL
  Filled 2012-08-29 (×4): qty 30

## 2012-08-29 MED ORDER — HEPARIN (PORCINE) IN NACL 100-0.45 UNIT/ML-% IJ SOLN
800.0000 [IU]/h | INTRAMUSCULAR | Status: DC
  Filled 2012-08-29: qty 250

## 2012-08-29 MED ORDER — POTASSIUM CHLORIDE 20 MEQ/15ML (10%) PO LIQD
40.0000 meq | Freq: Once | ORAL | Status: AC
  Administered 2012-08-29: 40 meq via ORAL
  Filled 2012-08-29: qty 30

## 2012-08-29 NOTE — Progress Notes (Signed)
Pt agreed to full bath and linen change. Pt expressed feelings of depression and hopelessness; not sure "people are telling her the truth." Became tearful, stating she's not sure she's going to make it back home. Wants to walk again and be home with her dogs and grandchildren.  Pt did eat several bites of pie and some jello today.

## 2012-08-29 NOTE — Progress Notes (Signed)
Subjective:  Says she feels reasonably well. Not SOB, no chest pain.    Objective:  Vital Signs in the last 24 hours: BP 113/77  Pulse 79  Temp(Src) 97.6 F (36.4 C) (Oral)  Resp 18  Ht 5\' 4"  (1.626 m)  Wt 93.1 kg (205 lb 4 oz)  BMI 35.21 kg/m2  SpO2 94%  Physical Exam: Lying in bed in NAD Lungs:  Clear  Cardiac:  Rapid irregular rhythm, normal S1 and S2, no S3 Abdomen:  PEG in place Extremities:  3+ edema present  Intake/Output from previous day: 04/19 0701 - 04/20 0700 In: 1503.5 [P.O.:200; I.V.:563.5; IV Piggyback:100] Out: 1670 [Urine:1670] Weight Filed Weights   08/27/12 0400 08/27/12 0458 08/28/12 0500  Weight: 92.1 kg (203 lb 0.7 oz) 92.1 kg (203 lb 0.7 oz) 93.1 kg (205 lb 4 oz)    Lab Results: Basic Metabolic Panel:  Recent Labs  16/10/96 0525 08/29/12 0100  NA 133* 135  K 3.3* 3.8  CL 98 100  CO2 30 29  GLUCOSE 79 96  BUN 14 14  CREATININE 0.76 0.81    CBC:  Recent Labs  08/27/12 0355 08/29/12 0100  WBC 6.8 6.4  HGB 11.4* 11.1*  HCT 33.7* 34.2*  MCV 81.4 84.0  PLT 113* 96*    BNP    Component Value Date/Time   PROBNP 1184.0* 08/26/2012 0500    PROTIME: Lab Results  Component Value Date   INR 1.84* 08/28/2012   INR 1.62* 08/27/2012   INR 1.89* 08/27/2012    Telemetry:  Atrial fib with rates still rapid at times on amiodarone and metoprolol.  Dig added last night Some PVC's versus aberrancy noted  Assessment/Plan:  1. Persistent atrial fibrillation with RVR some better with IV dig 2. Hypokalemia better 3. Runs of wide complex tachycardia nonsustained looks like abberancy  Rec:  Additional K today, continue dig load. Start anticoagulation with Eliquus  when OK from GI viewpoint and PEG   W. Ashley Royalty  MD [facility] Card[facility]  08/29/2012, 8:07 AM

## 2012-08-29 NOTE — Progress Notes (Signed)
ANTICOAGULATION CONSULT NOTE - Follow-up Consult  Pharmacy Consult for IV Heparin Indication: Afib with RVR  Allergies  Allergen Reactions  . Olmesartan Medoxomil Cough    REACTION: ? if cough  . Tetracycline Hcl     Unknown reaction, too long for patient to remember   . Venlafaxine     REACTION: severe dry moouth  . Adhesive (Tape) Rash    Patient Measurements: Height: 5\' 4"  (162.6 cm) Weight: 205 lb 4 oz (93.1 kg) IBW/kg (Calculated) : 54.7 Heparin Dosing Weight: 66kg  Vital Signs: Temp: 97.9 F (36.6 C) (04/20 0800) Temp src: Oral (04/20 0800) BP: 105/68 mmHg (04/20 0900) Pulse Rate: 86 (04/20 0900)  Labs:  Recent Labs  08/27/12 0355 08/27/12 0824 08/27/12 1415 08/28/12 0525  08/28/12 1815 08/29/12 0100 08/29/12 1120  HGB 11.4*  --   --   --   --   --  11.1*  --   HCT 33.7*  --   --   --   --   --  34.2*  --   PLT 113*  --   --   --   --   --  96*  --   LABPROT  --  21.0* 18.7* 20.6*  --   --   --   --   INR  --  1.89* 1.62* 1.84*  --   --   --   --   HEPARINUNFRC  --   --   --   --   < > 1.03* 0.32 0.26*  CREATININE 0.83  --   --  0.76  --   --  0.81  --   < > = values in this interval not displayed.  Estimated Creatinine Clearance: 83.8 ml/min (by C-G formula based on Cr of 0.81).   Medical History: Past Medical History  Diagnosis Date  . Depression   . DVT (deep venous thrombosis)     hx of on HRT left leg ~2006  . GERD (gastroesophageal reflux disease)   . Hyperlipidemia   . Hypertension   . Hypothyroidism   . PPD positive, treated     rx inh   . OSA on CPAP   . Diabetes mellitus   . 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 Breast cancer     a. 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  PMHx significant for breast cancer s/p mastectomy,  chemotherapy with no further cycles planned, newly diagnosed Afib, CHF, malnutrition, and chronic liver disease with cirrhosis.     Cardiology consulted for Afib with RVR, started apixaban 4/15.  Apixaban d/c'd after 1 dose due to need for PEG tube placement and IV Heparin bridge started 4/15.  PEG tube placement planned for 48 hours after apixaban dose and when INR <2.    INR has been slowly rising since March and could be due to multiple factors including chronic liver disease and malnutrition.  With INR>2, heparin was d/c'd 4/15 with orders to resume once INR<2.0  4/18 INR 1.62 following PO Vitamin K x 2 days and 1 unit FFP in AM.  Radiology proceeded with PEG placement.  Post-op PEG placement, pharmacy received v/o order from radiology by RN to restart IV heparin 4/19 am per Rx.  This AM, heparin level subtherapeutic on 700 units/hr.  CBC  ok, platelets chronically low - decreased a bit today, monitor.  Goal: Heparin level 0.3-0.7  Plan:   Increase IV heparin infusion to 800 units/hr (8 mL/hr).  Check HL @ 1800.  F/U plans for long-term anticoagulation.  Cardiology recommends switching anticoagulation to apixaban when OK from GI standpoint.   Clance Boll 08/29/2012 12:00 PM

## 2012-08-29 NOTE — Progress Notes (Signed)
Brief Pharmacy Consult Note:  Heparin level = 0.17 on 800 units/hr which is < goal (0.3-0.7) No bleeding reported  Plan:  Increase heparin 1000 units/hr  Recheck level in 6hrs  Loralee Pacas, PharmD, BCPS 08/29/2012 6:59 PM

## 2012-08-29 NOTE — Progress Notes (Signed)
NUTRITION FOLLOW UP  Intervention:   1. Increase Jevity 1.2 by 10 ml/hr every 8 hours to goal rate of 65 ml/hr with 30 ml Prostat daily to provide 1972 kcal, 102 g protein, 1248 ml free water.  2. If medically able, recommend advance diet per MD for comfort.  3. Patient would benefit from nocturnal feeds after discharge to promote enhanced mobility and aid in transition back to oral intake. If desired, recommend Jevity 1.5 (non-formulary) at 90 ml/hr run over 14 hours with 30 ml Prostat daily (1990 kcal, 95 g protein, 958 ml free water).  4. Monitor magnesium, potassium, and phosphorus daily for at least 3 days as patient is at risk for refeeding syndrome.   Nutrition Dx:   Inadequate oral intake related to no appetite as evidenced by PEG feedings.   Goal:   Patient will meet >/=90% of estimated nutrition needs.   Monitor:   TF advancement and tolerance, weight, labs  Assessment:   Patient with new PEG placed 4/18. TF started per protocol.  Jevity 1.2 at 40 ml/hr, providing 1152 kcal, 53 g protein, 775 ml free water.  Patient is tolerating TF at current rate. Patient and husband have questions regarding the eventual transition back to oral intake. We discussed possibilities for TF after discharge, including bolus vs. overnight feeds. Continuous feeds overnight may be more desirable to help transition back to oral feeds.  Patient started on Megace. She reports that her appetite has increased.  Patient is at risk for refeeding syndrome due to poor PO intake for 2 months and severe malnutrition. Potassium and magnesium WNL.   Height: Ht Readings from Last 1 Encounters:  08/24/12 5\' 4"  (1.626 m)    Weight Status:   Wt Readings from Last 1 Encounters:  08/28/12 205 lb 4 oz (93.1 kg)    Re-estimated needs:  Kcal: 1950-2100 kcal Protein: 100-125 g Fluid: 2.5 L  Skin: +3 BLE edema to hips. Left lower leg near the ankle with decreased area of erythema; stage 1 pressure ulcer on  heel  Diet Order: NPO   Intake/Output Summary (Last 24 hours) at 08/29/12 1218 Last data filed at 08/29/12 0936  Gross per 24 hour  Intake 1714.5 ml  Output   3250 ml  Net -1535.5 ml    Last BM: 4/18   Labs:   Recent Labs Lab 08/25/12 0400  08/27/12 0355 08/27/12 2113 08/28/12 0525 08/29/12 0100  NA 136  < > 133*  --  133* 135  K 2.7*  < > 3.1* 2.9* 3.3* 3.8  CL 100  < > 97  --  98 100  CO2 25  < > 31  --  30 29  BUN 11  < > 14  --  14 14  CREATININE 0.80  < > 0.83  --  0.76 0.81  CALCIUM 7.2*  < > 7.4*  --  7.5* 7.4*  MG 1.6  --   --  1.7  --  1.8  GLUCOSE 108*  < > 101*  --  79 96  < > = values in this interval not displayed.  CBG (last 3)   Recent Labs  08/28/12 1539 08/28/12 2248 08/29/12 0752  GLUCAP 76 82 106*    Scheduled Meds: . amiodarone  400 mg Oral BID  . antiseptic oral rinse  15 mL Mouth Rinse BID  . aspirin EC  81 mg Oral q morning - 10a  . atorvastatin  10 mg Oral QHS  . buPROPion  150 mg Oral q morning - 10a  . cefTRIAXone (ROCEPHIN)  IV  1 g Intravenous Q24H  . cholestyramine  4 g Oral BID  . [START ON 08/30/2012] digoxin  0.25 mg Intravenous Daily  . docusate  100 mg Per Tube BID  . feeding supplement  1 Container Oral BID BM  . furosemide  60 mg Intravenous Q12H  . insulin aspart  0-15 Units Subcutaneous TID WC  . letrozole  2.5 mg Oral Daily  . levothyroxine  125 mcg Oral QAC breakfast  . megestrol  400 mg Oral BID  . metoprolol  5 mg Intravenous Once  . metoprolol tartrate  50 mg Oral BID  . multivitamin with minerals  1 tablet Oral Daily  . pantoprazole sodium  40 mg Per Tube Q0600  . saccharomyces boulardii  250 mg Oral BID  . sodium chloride  3 mL Intravenous Q12H  . sodium chloride  3 mL Intravenous Q12H  . vancomycin  750 mg Intravenous Q12H    Continuous Infusions: . diltiazem (CARDIZEM) infusion Stopped (08/28/12 0807)  . feeding supplement (JEVITY 1.2 CAL) 1,000 mL (08/28/12 1832)  . heparin      Linnell Fulling, RD, LDN Pager #: [phone] After-Hours Pager #: [phone]

## 2012-08-29 NOTE — Progress Notes (Signed)
[facility] PROGRESS NOTE  [name] ZOX:[id number] DOB: [birth year] DOA: 08/24/2012 PCP: [name]., NP  Assessment/Plan: #1 A. fib with RVR Questionable etiology. May be secondary to stress and severe malnutrition and ongoing cancer. Cardiac enzymes are negative x3. Pro BNP is elevated. 2-D echo is pending.  -Patient was started on eliquis for anticoagulation per cardiology in anticipation of possible cardioversion, but eliquis was dc'ed and pt initially placed patient on full dose IV heparin in anticipation of possible PEG tube for her severe malnutrition, but due elevated INR, the heparin was dc'ed pending INR dropping to <2 then it will be resumed. -INR is1.89 on 4/18 following vit k, discussed with IR- she was given FFP to bring INR closer to 1.5  and PEG placement 4/18  -continue amiodarone and metoprolol per cards, cardizem drip was dc'ed 4/18 and plan per cards is to start dig  Better HR control once k repleted.   -some hypotension again this am, but improved, continue to monitor and hold antihypertensives as appropriate, holding off IVF given her anasarca -Rate poorly controlled last pm 4/19, but improved this am after Dig started overnight(4/19) -deferring further anticoagulation after PEG(done by Radiologist) to cardiology>> plan noted- cardiology to restart Eliquis when appropriate -appreciate the input and recommendations.   #2  Anarsarca/acute CHF exacerbation Likely secondary to severe malnutrition in the setting of afib with RVR likely leading to acute CHF exacerbation with pulmonary edema. Patient's albumin is 1.6. Chest x-ray consistent with pulmonary edema. - continue current IV Lasix, slowly diuresing.   -continue Strict I.'s and O.'s. Daily weights.  - 2-D echo with nl EF 55-60%, no wall motion abnormalities.  -Cardiology is following and appreciate assistance  #3 kleb urinary tract infection -kleb sensitive to Rocephin, will continue.  #4 left  lower extremity cellulitis Improving, Continue IV Rocephin and IV vancomycin.  #5 gastroesophageal reflux disease Continue PPI.  #6 type 2 diabetes Hemoglobin A1c 6.1. Continue sliding scale insulin.  #7 severe malnutrition/PEG placement Patient with 6-8 weeks of poor oral intake, decreased appetite. Megace started 4/15.  - s/p PEG tube placement 4/18 as above, continue tube feedings started last PM 4/19   -Discussed with patient's oncologist Dr. [name]- per Dr.Thompson  #8 history of breast cancer status post right mastectomy Pathologic staging PET 3, P. and 1, stage IIIa. ER and PR positive at 100%, HER -2/neu negative. Per oncology. -appreciate Dr Magrinat's input  #9 chronic liver disease -Continue diuretics. -contributing to likely underlying coagulopathy  #10 hypokalemia Secondary to diuretics.  -Replace K.  #11 prophylaxis PPI for GI prophylaxis. Heparin for DVT prophylaxis.     Code Status: Full Family Communication: Updated patient and husband at bedside. Disposition Plan: Home when medically stable.   Consultants:  Cardiology: Dr. Graciela Husbands 08/24/2012  Oncology Dr. [name] 08/24/2012  Procedures:  2-D echo  08/26/2038  Chest x-ray 08/24/2012, 08/25/2012  Antibiotics:  IV Rocephin 08/24/12  IV Vancomycin 08/25/12  HPI/Subjective: patient denies any c/o today. Husband at bedside Objective: Filed Vitals:   08/29/12 0135 08/29/12 0400 08/29/12 0600 08/29/12 0800  BP:  100/50 113/77 114/62  Pulse: 84 75 79 94  Temp:  97.6 F (36.4 C)  97.9 F (36.6 C)  TempSrc:  Oral  Oral  Resp: 18 9 18 27   Height:      Weight:      SpO2: 96% 99% 94% 99%    Intake/Output Summary (Last 24 hours) at 08/29/12 0935 Last data filed at 08/29/12 0800  Gross per 24  hour  Intake 1557.5 ml  Output   1670 ml  Net -112.5 ml   Filed Weights   08/27/12 0400 08/27/12 0458 08/28/12 0500  Weight: 92.1 kg (203 lb 0.7 oz) 92.1 kg (203 lb 0.7 oz) 93.1 kg (205 lb 4 oz)     Exam:   General:  NAD. Alert and orientedx3  Cardiovascular: Irregularly irregular,mildy tachy  Respiratory:decreased BS at bases, no crackles and no wheezes  Abdomen: Soft/NT/ND/+BS  Extremities: No c/c. +3 BLE edema to hips. Left lower leg near the ankle with decreased area of erythema.  Data Reviewed: Basic Metabolic Panel:  Recent Labs Lab 08/24/12 1225 08/25/12 0400 08/25/12 1430 08/26/12 0500 08/27/12 0355 08/27/12 2113 08/28/12 0525 08/29/12 0100  NA 134* 136 132* 134* 133*  --  133* 135  K 3.6 2.7* 3.0* 3.2* 3.1* 2.9* 3.3* 3.8  CL 102 100 97 98 97  --  98 100  CO2 22 25 25 26 31   --  30 29  GLUCOSE 83 108* 116* 114* 101*  --  79 96  BUN 10 11 12 13 14   --  14 14  CREATININE 0.72 0.80 0.81 0.80 0.83  --  0.76 0.81  CALCIUM 7.5* 7.2* 7.3* 7.4* 7.4*  --  7.5* 7.4*  MG 1.8 1.6  --   --   --  1.7  --  1.8   Liver Function Tests:  Recent Labs Lab 08/24/12 1225 08/26/12 0500  AST 50* 41*  ALT 41* 39*  ALKPHOS 242* 220*  BILITOT 0.6 0.5  PROT 5.5* 5.2*  ALBUMIN 1.6* 1.6*   No results found for this basename: LIPASE, AMYLASE,  in the last 168 hours No results found for this basename: AMMONIA,  in the last 168 hours CBC:  Recent Labs Lab 08/24/12 1225 08/25/12 0400 08/26/12 0500 08/27/12 0355 08/29/12 0100  WBC 8.4 7.5 7.9 6.8 6.4  NEUTROABS 6.2  --  5.9  --   --   HGB 13.1 12.5 12.1 11.4* 11.1*  HCT 39.2 36.8 36.1 33.7* 34.2*  MCV 81.0 81.4 80.9 81.4 84.0  PLT 136* 112* 120* 113* 96*   Cardiac Enzymes:  Recent Labs Lab 08/24/12 1640 08/24/12 2200 08/25/12 0400  TROPONINI <0.30 <0.30 <0.30   BNP (last 3 results)  Recent Labs  08/24/12 1225 08/25/12 0400 08/26/12 0500  PROBNP 1816.0* 1915.0* 1184.0*   CBG:  Recent Labs Lab 08/28/12 0804 08/28/12 1259 08/28/12 1539 08/28/12 2248 08/29/12 0752  GLUCAP 76 104* 76 82 106*    Recent Results (from the past 240 hour(s))  URINE CULTURE     Status: None   Collection Time     08/24/12 12:05 PM      Result Value Range Status   Specimen Description URINE, CLEAN CATCH   Final   Special Requests NONE   Final   Culture  Setup Time 08/24/2012 22:00   Final   Colony Count >=100,000 COLONIES/ML   Final   Culture KLEBSIELLA PNEUMONIAE   Final   Report Status 08/27/2012 FINAL   Final   Organism ID, Bacteria KLEBSIELLA PNEUMONIAE   Final  MRSA PCR SCREENING     Status: None   Collection Time    08/24/12  4:00 PM      Result Value Range Status   MRSA by PCR NEGATIVE  NEGATIVE Final   Comment:            The GeneXpert MRSA Assay (FDA     approved for NASAL specimens  only), is one component of a     comprehensive MRSA colonization     surveillance program. It is not     intended to diagnose MRSA     infection nor to guide or     monitor treatment for     MRSA infections.     Studies: Ir Gastrostomy Tube Mod Sed  08/27/2012  *RADIOLOGY REPORT*  Clinical Data:  Breast carcinoma, malnutrition and failure to thrive.  PERCUTANEOUS GASTROSTOMY TUBE PLACEMENT  Sedation:  0.5 mg IV Versed  Total Moderate Sedation Time: 10 minutes.  Contrast:  20 ml Omnipaque-300  Fluoroscopy Time: 2 minutes and 42 seconds.  Procedure:  The procedure, risks, benefits, and alternatives were explained to the patient.  Questions regarding the procedure were encouraged and answered.  The patient understands and consents to the procedure.  The evening prior to the procedure, the patient was given thin liquid barium to ingest in order to opacify the colon.  A 5-French catheter was then advanced through the patient's mouth under fluoroscopy into the esophagus and to the level of the stomach. This catheter was used to insufflate the stomach with air under fluoroscopy.  The abdominal wall was prepped with Betadine in a sterile fashion, and a sterile drape was applied covering the operative field.  A sterile gown and sterile gloves were used for the procedure. Local anesthesia was provided with 1%  Lidocaine.  A skin incision was made in the upper abdominal wall.  Under fluoroscopy, an 18 gauge trocar needle was advanced into the stomach.  Contrast injection was performed to confirm intraluminal position of the needle tip.  A single T tack was then deployed in the lumen of the stomach.  This was brought up to tension at the skin surface.  Over a guidewire, a 9-French sheath was advanced into the lumen of the stomach.  The wire was left in place as a safety wire.  A loop snare device from a percutaneous gastrostomy kit was then advanced into the stomach.  A floppy guide wire was advanced through the orogastric catheter under fluoroscopy in the stomach.  The loop snare advanced through the percutaneous gastric access was used to snare the guide wire. This allowed withdrawal of the loop snare out of the patient's mouth by retraction of the orogastric catheter and wire.  A 20-French bumper retention gastrostomy tube was looped around the snare device.  It was then pulled back through the patient's mouth. The retention bumper was brought up to the anterior gastric wall. The T tack suture was cut at the skin.  The exiting gastrostomy tube was cut to appropriate length and a feeding adapter applied. The catheter was injected with contrast material to confirm position and a fluoroscopic spot image saved.  The tube was then flushed with saline.  A dressing was applied over the gastrostomy exit site.  Complications:  None  Findings:  Initial fluoroscopy demonstrates adequate opacification of the colon by ingested barium in order to prevent colonic injury during the procedure.  The stomach distended well with air allowing safe placement of the gastrostomy tube.  After placement, the tip of the gastrostomy tube lies in the body of the stomach.  IMPRESSION: Percutaneous gastrostomy with placement of a 20-French bumper retention tube in the body of the stomach.  This tube can be used for percutaneous feeds beginning in 24  hours after placement.   Original Report Authenticated By: Irish Lack, M.D.     Scheduled Meds: . amiodarone  400 mg Oral BID  . antiseptic oral rinse  15 mL Mouth Rinse BID  . aspirin EC  81 mg Oral q morning - 10a  . atorvastatin  10 mg Oral QHS  . buPROPion  150 mg Oral q morning - 10a  . cefTRIAXone (ROCEPHIN)  IV  1 g Intravenous Q24H  . cholestyramine  4 g Oral BID  . digoxin  0.25 mg Intravenous Q6H  . [START ON 08/30/2012] digoxin  0.25 mg Intravenous Daily  . docusate  100 mg Per Tube BID  . feeding supplement  1 Container Oral BID BM  . furosemide  60 mg Intravenous Q12H  . insulin aspart  0-15 Units Subcutaneous TID WC  . letrozole  2.5 mg Oral Daily  . levothyroxine  125 mcg Oral QAC breakfast  . megestrol  400 mg Oral BID  . metoprolol  5 mg Intravenous Once  . metoprolol tartrate  50 mg Oral BID  . multivitamin with minerals  1 tablet Oral Daily  . pantoprazole sodium  40 mg Per Tube Q0600  . potassium chloride  40 mEq Oral Once  . saccharomyces boulardii  250 mg Oral BID  . sodium chloride  3 mL Intravenous Q12H  . sodium chloride  3 mL Intravenous Q12H  . vancomycin  750 mg Intravenous Q12H   Continuous Infusions: . diltiazem (CARDIZEM) infusion Stopped (08/28/12 0807)  . feeding supplement (JEVITY 1.2 CAL) 1,000 mL (08/28/12 1832)  . heparin 700 Units/hr (08/29/12 1610)    Principal Problem:   A-fib Active Problems:   HYPOTHYROIDISM   DIABETES MELLITUS, TYPE II   Morbid obesity   DEPRESSION   OBSTRUCTIVE SLEEP APNEA   GERD   DVT, HX OF   Liver disease, chronic, with cirrhosis   Cancer of breast   Hypokalemia   Obesity, unspecified   Severe malnutrition in the context of acute illness   Anasarca   UTI (lower urinary tract infection)   CHF, acute   Cellulitis of leg, left    Time spent: > 35 mins    Olivene Cookston C  [facility] Pager [phone]. If 7PM-7AM, please contact night-coverage at [email or web address], password [facility] 08/29/2012,  9:35 AM  LOS: 5 days

## 2012-08-29 NOTE — Progress Notes (Signed)
ANTIBIOTIC CONSULT NOTE - FOLLOW UP  Pharmacy Consult for vancomycin Indication: LLE Cellulitis   Allergies  Allergen Reactions  . Olmesartan Medoxomil Cough    REACTION: ? if cough  . Tetracycline Hcl     Unknown reaction, too long for patient to remember   . Venlafaxine     REACTION: severe dry moouth  . Adhesive (Tape) Rash    Patient Measurements: Height: 5\' 4"  (162.6 cm) Weight: 205 lb 4 oz (93.1 kg) IBW/kg (Calculated) : 54.7 Adjusted Body Weight:   Vital Signs: Temp: 97.6 F (36.4 C) (04/20 0400) Temp src: Oral (04/20 0400) BP: 113/77 mmHg (04/20 0600) Pulse Rate: 79 (04/20 0600) Intake/Output from previous day: 04/19 0701 - 04/20 0700 In: 1126.5 [I.V.:536.5; IV Piggyback:100] Out: 1445 [Urine:1445] Intake/Output from this shift: Total I/O In: 746.5 [I.V.:276.5; Other:470] Out: 1225 [Urine:1225]  Labs:  Recent Labs  08/27/12 0355 08/28/12 0525 08/29/12 0100  WBC 6.8  --  6.4  HGB 11.4*  --  11.1*  PLT 113*  --  96*  CREATININE 0.83 0.76 0.81   Estimated Creatinine Clearance: 83.8 ml/min (by C-G formula based on Cr of 0.81).  Recent Labs  08/28/12 1124 08/29/12 0100  VANCOTROUGH 27.7*  --   VANCORANDOM  --  22.0     Microbiology: Recent Results (from the past 720 hour(s))  CLOSTRIDIUM DIFFICILE BY PCR     Status: None   Collection Time    08/04/12  9:03 AM      Result Value Range Status   C difficile by pcr NEGATIVE  NEGATIVE Final  URINE CULTURE     Status: None   Collection Time    08/05/12  3:45 PM      Result Value Range Status   Specimen Description URINE, CATHETERIZED   Final   Special Requests Immunocompromised   Final   Culture  Setup Time 08/06/2012 01:21   Final   Colony Count >=100,000 COLONIES/ML   Final   Culture     Final   Value: ESCHERICHIA COLI     KLEBSIELLA PNEUMONIAE   Report Status 08/08/2012 FINAL   Final   Organism ID, Bacteria ESCHERICHIA COLI   Final   Organism ID, Bacteria KLEBSIELLA PNEUMONIAE   Final   CULTURE, BLOOD (ROUTINE X 2)     Status: None   Collection Time    08/05/12  4:00 PM      Result Value Range Status   Specimen Description BLOOD LEFT HAND   Final   Special Requests BOTTLES DRAWN AEROBIC AND ANAEROBIC 10CC   Final   Culture  Setup Time 08/05/2012 21:54   Final   Culture NO GROWTH 5 DAYS   Final   Report Status 08/11/2012 FINAL   Final  CULTURE, BLOOD (ROUTINE X 2)     Status: None   Collection Time    08/05/12  4:03 PM      Result Value Range Status   Specimen Description BLOOD LEFT ARM   Final   Special Requests BOTTLES DRAWN AEROBIC AND ANAEROBIC 10CC   Final   Culture  Setup Time 08/05/2012 21:54   Final   Culture NO GROWTH 5 DAYS   Final   Report Status 08/11/2012 FINAL   Final  MRSA PCR SCREENING     Status: None   Collection Time    08/07/12  3:14 PM      Result Value Range Status   MRSA by PCR NEGATIVE  NEGATIVE Final   Comment:  The GeneXpert MRSA Assay (FDA     approved for NASAL specimens     only), is one component of a     comprehensive MRSA colonization     surveillance program. It is not     intended to diagnose MRSA     infection nor to guide or     monitor treatment for     MRSA infections.  URINE CULTURE     Status: None   Collection Time    08/24/12 12:05 PM      Result Value Range Status   Specimen Description URINE, CLEAN CATCH   Final   Special Requests NONE   Final   Culture  Setup Time 08/24/2012 22:00   Final   Colony Count >=100,000 COLONIES/ML   Final   Culture KLEBSIELLA PNEUMONIAE   Final   Report Status 08/27/2012 FINAL   Final   Organism ID, Bacteria KLEBSIELLA PNEUMONIAE   Final  MRSA PCR SCREENING     Status: None   Collection Time    08/24/12  4:00 PM      Result Value Range Status   MRSA by PCR NEGATIVE  NEGATIVE Final   Comment:            The GeneXpert MRSA Assay (FDA     approved for NASAL specimens     only), is one component of a     comprehensive MRSA colonization     surveillance program. It is  not     intended to diagnose MRSA     infection nor to guide or     monitor treatment for     MRSA infections.    Anti-infectives   Start     Dose/Rate Route Frequency Ordered Stop   08/29/12 1000  vancomycin (VANCOCIN) IVPB 750 mg/150 ml premix     750 mg 150 mL/hr over 60 Minutes Intravenous Every 12 hours 08/29/12 0608     08/25/12 2300  vancomycin (VANCOCIN) IVPB 1000 mg/200 mL premix  Status:  Discontinued     1,000 mg 200 mL/hr over 60 Minutes Intravenous Every 12 hours 08/25/12 1011 08/28/12 1301   08/25/12 1100  vancomycin (VANCOCIN) 2,000 mg in sodium chloride 0.9 % 500 mL IVPB     2,000 mg 250 mL/hr over 120 Minutes Intravenous  Once 08/25/12 1011 08/25/12 1355   08/24/12 1545  cefTRIAXone (ROCEPHIN) 1 g in dextrose 5 % 50 mL IVPB     1 g 100 mL/hr over 30 Minutes Intravenous Every 24 hours 08/24/12 1536     08/24/12 1315  cefTRIAXone (ROCEPHIN) 1 g in dextrose 5 % 50 mL IVPB     1 g 100 mL/hr over 30 Minutes Intravenous  Once 08/24/12 1311 08/24/12 1537      Assessment: Patient with still high vancomycin level after ~500mg  dose given on top of trough of ~27.  Level about 12hr after 500mg  given.    Goal of Therapy:  Vancomycin trough level 10-15 mcg/ml  Plan:  Measure antibiotic drug levels at steady state Follow up culture results Change vancomycin 750mg  iv q12hr, next dose at 1000 4/20  Darlina Guys, [place] [facility] 08/29/2012,6:15 AM

## 2012-08-29 NOTE — Progress Notes (Signed)
COURTESY NOTE:  Patient is tolerating the PEG tube well and is encouraged about starting her feeds. She wants a piece of sweet potato pie. Afib continues to be main issue. In the meantime her daughter who has a history of bipolar disorder has threatened to commit suicide x2 (for reasons unrelated to the patient's condition) and has been hospitalized in [place]  SUMMARY:  57 y.o. [name] admitted with A Fib/ RVR  (1) status post right mastectomy under the care of Dr. 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 Lymphovascular perineural invasion was identified. 2 of 18 lymph nodes were involved. Margins were clear. Pathologic staging pT3, pN1a, stage IIIA.   (2) treated in the adjuvant setting with docetaxel/cyclophosphamide given every 3 weeks, with Neulasta on day 2 for granulocyte support. First treatment was given on 07/06/2012. Due to intolerance, we decreased her doses by 15% beginning cycle 2, given 07/29/2012. However, tolerance remained poor and no further chemotherapy is planned .  (3) patient will need postmastectomy radiation, to be followed by antiestrogen therapy.   (4) comorbidities include diabetes, hypertension, chronic liver disease, and c diff negative diarrhea (resolved)  (5) malnutrition: s/p PEG placement; nutritionist to devise outpatient plan as patient likely to depend largely on PEG feeds for nutrition and hydration as outpatient  Appreciate your help to this patient!

## 2012-08-29 NOTE — Progress Notes (Signed)
ANTICOAGULATION CONSULT NOTE - Follow Up Consult  Pharmacy Consult for Heparin Indication: atrial fibrillation  Allergies  Allergen Reactions  . Olmesartan Medoxomil Cough    REACTION: ? if cough  . Tetracycline Hcl     Unknown reaction, too long for patient to remember   . Venlafaxine     REACTION: severe dry moouth  . Adhesive (Tape) Rash    Patient Measurements: Height: 5\' 4"  (162.6 cm) Weight: 205 lb 4 oz (93.1 kg) IBW/kg (Calculated) : 54.7 Heparin Dosing Weight:   Vital Signs: Temp: 97.6 F (36.4 C) (04/20 0400) Temp src: Oral (04/20 0400) BP: 113/77 mmHg (04/20 0600) Pulse Rate: 79 (04/20 0600)  Labs:  Recent Labs  08/27/12 0355 08/27/12 0824 08/27/12 1415 08/28/12 0525 08/28/12 1123 08/28/12 1815 08/29/12 0100  HGB 11.4*  --   --   --   --   --  11.1*  HCT 33.7*  --   --   --   --   --  34.2*  PLT 113*  --   --   --   --   --  96*  LABPROT  --  21.0* 18.7* 20.6*  --   --   --   INR  --  1.89* 1.62* 1.84*  --   --   --   HEPARINUNFRC  --   --   --   --  0.30 1.03* 0.32  CREATININE 0.83  --   --  0.76  --   --  0.81    Estimated Creatinine Clearance: 83.8 ml/min (by C-G formula based on Cr of 0.81).   Medications:  Infusions:  . diltiazem (CARDIZEM) infusion Stopped (08/28/12 0807)  . feeding supplement (JEVITY 1.2 CAL) 1,000 mL (08/28/12 1832)  . heparin 700 Units/hr (08/29/12 0454)  . [DISCONTINUED] heparin 1,000 Units/hr (08/28/12 0981)    Assessment: Patient with heparin level at goal.  No issues noted. Goal of Therapy:  Heparin level 0.3-0.7 units/ml Monitor platelets by anticoagulation protocol: Yes   Plan:  Continue heparin at current rate, recheck at 1000.  Darlina Guys, [facility] 08/29/2012,6:19 AM

## 2012-08-30 DIAGNOSIS — I10 Essential (primary) hypertension: Secondary | ICD-10-CM

## 2012-08-30 DIAGNOSIS — E119 Type 2 diabetes mellitus without complications: Secondary | ICD-10-CM

## 2012-08-30 LAB — PREPARE FRESH FROZEN PLASMA: Unit division: 0

## 2012-08-30 LAB — BASIC METABOLIC PANEL
Chloride: 98 mEq/L (ref 96–112)
GFR calc Af Amer: >90 mL/min (ref >90)
GFR calc non Af Amer: >90 mL/min (ref >90)
Potassium: 3.3 mEq/L — ABNORMAL LOW (ref 3.5–5.1)
Sodium: 134 mEq/L — ABNORMAL LOW (ref 135–145)

## 2012-08-30 LAB — GLUCOSE, CAPILLARY
Glucose-Capillary: 105 mg/dL — ABNORMAL HIGH (ref 70–99)
Glucose-Capillary: 126 mg/dL — ABNORMAL HIGH (ref 70–99)
Glucose-Capillary: 119 mg/dL — ABNORMAL HIGH (ref 70–99)

## 2012-08-30 LAB — CBC
HCT: 34.3 % — ABNORMAL LOW (ref 36.0–46.0)
Hemoglobin: 11.1 g/dL — ABNORMAL LOW (ref 12.0–15.0)
MCHC: 32.4 g/dL (ref 30.0–36.0)
RBC: 4.06 MIL/uL (ref 3.87–5.11)
WBC: 3.4 10*3/uL — ABNORMAL LOW (ref 4.0–10.5)

## 2012-08-30 LAB — PROTIME-INR: INR: 1.78 — ABNORMAL HIGH (ref 0.00–1.49)

## 2012-08-30 LAB — HEPARIN LEVEL (UNFRACTIONATED): Heparin Unfractionated: 0.43 IU/mL (ref 0.30–0.70)

## 2012-08-30 MED ORDER — DILTIAZEM HCL 100 MG IV SOLR
5.0000 mg/h | INTRAVENOUS | Status: DC
  Administered 2012-08-30: 10 mg/h via INTRAVENOUS
  Administered 2012-08-30: 5 mg/h via INTRAVENOUS
  Filled 2012-08-30: qty 100

## 2012-08-30 MED ORDER — BIOTENE DRY MOUTH MT LIQD
15.0000 mL | Freq: Two times a day (BID) | OROMUCOSAL | Status: DC
  Administered 2012-08-31 – 2012-09-05 (×7): 15 mL via OROMUCOSAL

## 2012-08-30 MED ORDER — WARFARIN SODIUM 2 MG PO TABS
2.0000 mg | ORAL_TABLET | Freq: Once | ORAL | Status: AC
  Administered 2012-08-30: 2 mg via ORAL
  Filled 2012-08-30: qty 1

## 2012-08-30 MED ORDER — METOPROLOL TARTRATE 50 MG PO TABS
50.0000 mg | ORAL_TABLET | Freq: Three times a day (TID) | ORAL | Status: DC
  Filled 2012-08-30 (×4): qty 1

## 2012-08-30 MED ORDER — POTASSIUM CHLORIDE CRYS ER 20 MEQ PO TBCR
40.0000 meq | EXTENDED_RELEASE_TABLET | Freq: Two times a day (BID) | ORAL | Status: DC
  Filled 2012-08-30: qty 2

## 2012-08-30 MED ORDER — WARFARIN - PHARMACIST DOSING INPATIENT: Freq: Every day | Status: DC

## 2012-08-30 MED ORDER — CHLORHEXIDINE GLUCONATE 0.12 % MT SOLN
15.0000 mL | Freq: Two times a day (BID) | OROMUCOSAL | Status: DC
  Administered 2012-08-30 – 2012-09-06 (×6): 15 mL via OROMUCOSAL
  Filled 2012-08-30 (×14): qty 15

## 2012-08-30 MED ORDER — METOPROLOL TARTRATE 25 MG/10 ML ORAL SUSPENSION
50.0000 mg | Freq: Three times a day (TID) | ORAL | Status: DC
  Administered 2012-08-30 – 2012-09-01 (×6): 50 mg
  Filled 2012-08-30 (×10): qty 20

## 2012-08-30 MED ORDER — POTASSIUM CHLORIDE 20 MEQ/15ML (10%) PO LIQD
40.0000 meq | Freq: Two times a day (BID) | ORAL | Status: AC
  Administered 2012-08-30 – 2012-08-31 (×3): 40 meq
  Filled 2012-08-30 (×3): qty 30

## 2012-08-30 MED ORDER — HEPARIN (PORCINE) IN NACL 100-0.45 UNIT/ML-% IJ SOLN
1450.0000 [IU]/h | INTRAMUSCULAR | Status: DC
  Administered 2012-08-31 – 2012-09-01 (×3): 1100 [IU]/h via INTRAVENOUS
  Administered 2012-09-02 – 2012-09-05 (×5): 1450 [IU]/h via INTRAVENOUS
  Filled 2012-08-30 (×7): qty 250

## 2012-08-30 NOTE — Progress Notes (Signed)
Arrived at pt bedside.  Pt found asleep, no distress noted.  Per RN, pt refused to wear cpap tonight.

## 2012-08-30 NOTE — Consult Note (Signed)
WOC consult Note Reason for Consult: Consult requested for bilat buttocks and bilat heels.  Pt and husband said she was left in wheelchair for many hours [facility] the [facility] prior to admission and was incontinent of stool and in diapers.   Wound type: Patchy areas of partial thickness skin loss in buttocks folds; appearance consistent with moisture associated skin damage.  2 larger areas on each buttocks. Right buttock 2X1X.1cm 100% red and moist, mod tan drainage, no odor Left buttock 3X2X.1cm 100% red and moist, mod tan drainage, no odor Pt states her diarrhea has improved at this time.  Remains red and macerated near rectum.  On pressure redistribution mattress to increase airflow.  Foam dressing to buttocks to protect and promote healing.  Repositioned Q 2 hours, on tube feeds to optimize nutrition. Pressure Ulcer POA: These are not pressure ulcesr and were present on admission. Bilat heels were noted to be stage 1 on admission.  Protected by foam dressings and floated with pillows to reduce pressure.  Currently, skin to bilat heels is intact and blanches.  No topical treatment needed except continued offloading. Discussed topical treatment and preventive measures with pt and husband at bedside.  They understand use of barrier cream when at home to protect skin and repel moisture and deny further questions. Please re-consult if further assistance is needed.  Thank-you,  Cammie Mcgee MSN, RN, CWOCN, [place], CNS [phone]

## 2012-08-30 NOTE — Progress Notes (Signed)
ANTICOAGULATION CONSULT NOTE - Follow Up Consult  Pharmacy Consult for Heparin Indication: atrial fibrillation with RVR  Allergies  Allergen Reactions  . Olmesartan Medoxomil Cough    REACTION: ? if cough  . Tetracycline Hcl     Unknown reaction, too long for patient to remember   . Venlafaxine     REACTION: severe dry moouth  . Adhesive (Tape) Rash    Patient Measurements: Height: 5\' 4"  (162.6 cm) Weight: 205 lb 4 oz (93.1 kg) IBW/kg (Calculated) : 54.7 Heparin Dosing Weight:   Vital Signs: Temp: 97.4 F (36.3 C) (04/21 0000) Temp src: Oral (04/21 0000) BP: 102/57 mmHg (04/21 0200) Pulse Rate: 50 (04/21 0210)  Labs:  Recent Labs  08/27/12 0355 08/27/12 0824 08/27/12 1415 08/28/12 0525  08/29/12 0100 08/29/12 1120 08/29/12 1810 08/30/12 0157  HGB 11.4*  --   --   --   --  11.1*  --   --   --   HCT 33.7*  --   --   --   --  34.2*  --   --   --   PLT 113*  --   --   --   --  96*  --   --   --   LABPROT  --  21.0* 18.7* 20.6*  --   --   --   --   --   INR  --  1.89* 1.62* 1.84*  --   --   --   --   --   HEPARINUNFRC  --   --   --   --   < > 0.32 0.26* 0.17* 0.43  CREATININE 0.83  --   --  0.76  --  0.81  --   --   --   < > = values in this interval not displayed.  Estimated Creatinine Clearance: 83.8 ml/min (by C-G formula based on Cr of 0.81).   Medications:  Infusions:  . diltiazem (CARDIZEM) infusion Stopped (08/28/12 0807)  . feeding supplement (JEVITY 1.2 CAL) 1,000 mL (08/29/12 2227)  . heparin 1,000 Units/hr (08/29/12 1910)  . [DISCONTINUED] feeding supplement (JEVITY 1.2 CAL) 1,000 mL (08/28/12 1832)  . [DISCONTINUED] heparin 700 Units/hr (08/29/12 0981)  . [DISCONTINUED] heparin 800 Units/hr (08/29/12 1855)    Assessment: Patient with heparin level at goal.  No issues noted.  Goal of Therapy:  Heparin level 0.3-0.7 units/ml Monitor platelets by anticoagulation protocol: Yes   Plan:  Continue heparin at current rate, recheck level at  0900.  Darlina Guys, Jacquen[facility] Shone [facility] 08/30/2012,3:18 AM

## 2012-08-30 NOTE — Progress Notes (Signed)
CARE MANAGEMENT NOTE 08/30/2012  Patient:  [name], [name]   Account Number:  [id number]  Date Initiated:  08/25/2012  Documentation initiated by:  Suleyman Ehrman  Subjective/Objective Assessment:   pt readmitted due to a.fib and requiring iv cardizem drip, hx of ovarian ca     Action/Plan:   snf   Anticipated DC Date:  09/02/2012   Anticipated DC Plan:  SKILLED NURSING FACILITY  In-house referral  Clinical Social Worker      DC Planning Services  NA      [facility] Choice  NA   Choice offered to / List presented to:  NA   DME arranged  NA      DME agency  NA     HH arranged  NA      HH agency  NA   Status of service:  In process, will continue to follow Medicare Important Message given?  NA - LOS <3 / Initial given by admissions (If response is "NO", the following Medicare IM given date fields will be blank) Date Medicare IM given:   Date Additional Medicare IM given:    Discharge Disposition:    Per UR Regulation:  Reviewed for med. necessity/level of care/duration of stay  If discussed at Long Length of Stay Meetings, dates discussed:    Comments:  [id number] Earlene Plater, RN, BSN, CCM:  CHART REVIEWED AND UPDATED.  Next chart review due on [id number]. NO DISCHARGE NEEDS PRESENT AT THIS TIME.  patient wants to go home to see her dogs and grandchildren.  Is afraid that this will not be able to happen.  Possible cardiversion planned if increased iv meds do not help controll heart rate. CASE MANAGEMENT [phone]  [id number] Earlene Plater, RN, BSN, CCM:  CHART REVIEWED AND UPDATED.  Next chart review due on [id number]. NO DISCHARGE NEEDS PRESENT AT THIS TIME. CASE MANAGEMENT [phone]

## 2012-08-30 NOTE — Progress Notes (Signed)
    Subjective:  Feels ok. No CP or dyspnea. Becoming frustrated by rapid heart rate.  Objective:  Vital Signs in the last 24 hours: Temp:  [97.4 F (36.3 C)-98.5 F (36.9 C)] 97.5 F (36.4 C) (04/21 0400) Pulse Rate:  [50-128] 56 (04/21 0600) Resp:  [8-27] 14 (04/21 0600) BP: (87-114)/(48-70) 101/70 mmHg (04/21 0600) SpO2:  [92 %-100 %] 99 % (04/21 0600)  Intake/Output from previous day: 04/20 0701 - 04/21 0700 In: 1966.5 [I.V.:616.5; IV Piggyback:350] Out: 4950 [Urine:4950]  Physical Exam: Pt is alert and oriented, NAD HEENT: normal Neck: JVP - normal, carotids 2+= without bruits Lungs: CTA bilaterally CV: irreg irreg, tachy, without murmur or gallop Abd: soft, NT, PEG site ok Ext: diffuse edema, distal pulses intact and equal Skin: warm/dry no rash  Lab Results:  Recent Labs  08/29/12 0100 08/30/12 0530  WBC 6.4 3.4*  HGB 11.1* 11.1*  PLT 96* 103*    Recent Labs  08/29/12 0100 08/30/12 0530  NA 135 134*  K 3.8 3.3*  CL 100 98  CO2 29 32  GLUCOSE 96 100*  BUN 14 13  CREATININE 0.81 0.69   No results found for this basename: TROPONINI, CK, MB,  in the last 72 hours  Cardiac Studies: 2D Echo 08/25/2012: Left ventricle: The cavity size was normal. Wall thickness was increased in a pattern of mild LVH. There was focal basal hypertrophy. Systolic function was normal. The estimated ejection fraction was in the range of 55% to 60%. Wall motion was normal; there were no regional wall motion abnormalities.  ------------------------------------------------------------ Aortic valve: Structurally normal valve. Cusp separation was normal. Doppler: Transvalvular velocity was within the normal range. There was no stenosis. No regurgitation.  ------------------------------------------------------------ Aorta: Aortic root: The aortic root was normal in size. Ascending aorta: The ascending aorta was normal in  size.  ------------------------------------------------------------ Mitral valve: Doppler: Moderate regurgitation.  ------------------------------------------------------------ Left atrium: The atrium was normal in size.  ------------------------------------------------------------ Right ventricle: The cavity size was normal. Systolic function was normal.  ------------------------------------------------------------ Pulmonic valve: Doppler: No significant regurgitation.  ------------------------------------------------------------ Tricuspid valve: Doppler: Trivial regurgitation.  ------------------------------------------------------------ Right atrium: The atrium was normal in size.  ------------------------------------------------------------ Pericardium: There was no pericardial effusion.  Tele: Atrial fibrillation, heart rate 105-130 bpm, short runs of WCT suspect abberancy  Assessment/Plan:  Atrial fibrillation with RVR. Difficult to rate-control despite multiple medications (metoprolol 50 mg BID, digoxin 0.25 mg IV daily, and amiodarone 400 mg BID). BP is low and I think IV lasix should be stopped in order to allow BP room for AV nodal blocking agents. Difficult situation in setting of her very poor nutritional status. Will proceed as follows:   Stop digoxin  Start IV diltiazem without a bolus  Start warfarin  Stop IV lasix  Increase metoprolo to 50 mg TID  Start warfarin (she has tolerated in past and I'm not sure a novel anticoagulatnt is a good idea in setting of cirrhosis, poor nutritional status, etc).   Pt is quite frustrated and stressed. Lengthy discussion with patient and her husband re: approach to afib, rate-control, consideration of cardioversion if rate-control unsuccessful. Time spent greater than 40 min.  Tonny Bollman, M.D. 08/30/2012, 7:33 AM

## 2012-08-30 NOTE — Progress Notes (Signed)
[facility] PROGRESS NOTE  [name]:[id number] DOB: 04-20-1956 DOA: 08/24/2012 PCP: [name]., NP  Assessment/Plan: #1 A. fib with RVR Questionable etiology. May be secondary to stress and severe malnutrition and ongoing cancer. Cardiac enzymes are negative x3. Pro BNP is elevated. 2-D echo is pending.  -Patient was started on eliquis for anticoagulation per cardiology in anticipation of possible cardioversion, but eliquis was dc'ed and pt initially placed patient on full dose IV heparin in anticipation of possible PEG tube for her severe malnutrition, but due elevated INR, the heparin was dc'ed pending INR dropping to <2 then it will be resumed. -INR is1.89 on 4/18 following vit k, discussed with IR- she was given FFP to bring INR closer to 1.5  and PEG placement 4/18  -continue amiodarone and metoprolol per cards, cardizem drip was dc'ed 4/18 and plan per cards is to start dig  Better HR control once k repleted.   -some hypotension again this am, but improved, continue to monitor and hold antihypertensives as appropriate, holding off IVF given her anasarca -Rate still poorly controlled last pm 4/20, started on Cardizem drip for this a.m. per cardiology, follow -deferring further anticoagulation after PEG(done by Radiologist) to cardiology>> plan now to start patient on Coumadin instead per cardiology -appreciate the input and recommendations.   #2  Anarsarca/acute CHF exacerbation Likely secondary to severe malnutrition in the setting of afib with RVR likely leading to acute CHF exacerbation with pulmonary edema. Patient's albumin is 1.6. Chest x-ray consistent with pulmonary edema. - continue current IV Lasix, better diuresis overnight 4/20.   -continue Strict I.'s and O.'s. Daily weights.  - 2-D echo with nl EF 55-60%, no wall motion abnormalities.  -Cardiology is following and appreciate assistance  #3 kleb urinary tract infection -kleb sensitive to  Rocephin-day 7/7, will d/c  #4 left lower extremity cellulitis Improving,  Continue IV vancomycin.  #5 gastroesophageal reflux disease Continue PPI.  #6 type 2 diabetes Hemoglobin A1c 6.1. Continue sliding scale insulin.  #7 severe malnutrition/PEG placement Patient with 6-8 weeks of poor oral intake, decreased appetite. Megace started 4/15.  - s/p PEG tube placement 4/18 as above, continue tube feedings started 4/19 -patient tolerating well  -Discussed with patient's oncologist Dr. [name]- per Dr.Thompson  #8 history of breast cancer status post right mastectomy Pathologic staging PET 3, P. and 1, stage IIIa. ER and PR positive at 100%, HER -2/neu negative. Per oncology. -appreciate Dr Magrinat's input  #9 chronic liver disease -Continue diuretics. -contributing to likely underlying coagulopathy  #10 hypokalemia Secondary to diuretics.  -Replace K.  #11 prophylaxis PPI for GI prophylaxis. Heparin for DVT prophylaxis.     Code Status: Full Family Communication: Updated patient and husband at bedside. Disposition Plan: Home when medically stable.   Consultants:  Cardiology: Dr. Graciela Husbands 08/24/2012  Oncology Dr. [name] 08/24/2012  Procedures:  2-D echo  08/26/2038  Chest x-ray 08/24/2012, 08/25/2012  Antibiotics:  IV Rocephin 08/24/12  IV Vancomycin 08/25/12  HPI/Subjective: Discouraged/depressed because her heart rate continuing to be a problem. She denies chest pain or shortness of breath. Objective: Filed Vitals:   08/30/12 0400 08/30/12 0500 08/30/12 0600 08/30/12 0800  BP: 90/53 97/53 101/70   Pulse: 86 88 56   Temp: 97.5 F (36.4 C)   98.2 F (36.8 C)  TempSrc: Axillary   Oral  Resp: 10 14 14    Height:      Weight:      SpO2: 99% 99% 99%     Intake/Output Summary (  Last 24 hours) at 08/30/12 0904 Last data filed at 08/30/12 0400  Gross per 24 hour  Intake 1795.47 ml  Output   3250 ml  Net -1454.53 ml   Filed Weights   08/27/12 0400  08/27/12 0458 08/28/12 0500  Weight: 92.1 kg (203 lb 0.7 oz) 92.1 kg (203 lb 0.7 oz) 93.1 kg (205 lb 4 oz)    Exam:   General:  NAD. Alert and orientedx3  Cardiovascular: Irregularly irregular,mildy tachy  Respiratory:decreased BS at bases, no crackles and no wheezes  Abdomen: Soft/NT/ND/+BS  Extremities: No c/c. +2-3 BLE edema to hips. Left lower leg near the ankle with mild area of erythema.  Data Reviewed: Basic Metabolic Panel:  Recent Labs Lab 08/24/12 1225 08/25/12 0400  08/26/12 0500 08/27/12 0355 08/27/12 2113 08/28/12 0525 08/29/12 0100 08/30/12 0530  NA 134* 136  < > 134* 133*  --  133* 135 134*  K 3.6 2.7*  < > 3.2* 3.1* 2.9* 3.3* 3.8 3.3*  CL 102 100  < > 98 97  --  98 100 98  CO2 22 25  < > 26 31  --  30 29 32  GLUCOSE 83 108*  < > 114* 101*  --  79 96 100*  BUN 10 11  < > 13 14  --  14 14 13   CREATININE 0.72 0.80  < > 0.80 0.83  --  0.76 0.81 0.69  CALCIUM 7.5* 7.2*  < > 7.4* 7.4*  --  7.5* 7.4* 7.3*  MG 1.8 1.6  --   --   --  1.7  --  1.8  --   < > = values in this interval not displayed. Liver Function Tests:  Recent Labs Lab 08/24/12 1225 08/26/12 0500  AST 50* 41*  ALT 41* 39*  ALKPHOS 242* 220*  BILITOT 0.6 0.5  PROT 5.5* 5.2*  ALBUMIN 1.6* 1.6*   No results found for this basename: LIPASE, AMYLASE,  in the last 168 hours No results found for this basename: AMMONIA,  in the last 168 hours CBC:  Recent Labs Lab 08/24/12 1225 08/25/12 0400 08/26/12 0500 08/27/12 0355 08/29/12 0100 08/30/12 0530  WBC 8.4 7.5 7.9 6.8 6.4 3.4*  NEUTROABS 6.2  --  5.9  --   --   --   HGB 13.1 12.5 12.1 11.4* 11.1* 11.1*  HCT 39.2 36.8 36.1 33.7* 34.2* 34.3*  MCV 81.0 81.4 80.9 81.4 84.0 84.5  PLT 136* 112* 120* 113* 96* 103*   Cardiac Enzymes:  Recent Labs Lab 08/24/12 1640 08/24/12 2200 08/25/12 0400  TROPONINI <0.30 <0.30 <0.30   BNP (last 3 results)  Recent Labs  08/24/12 1225 08/25/12 0400 08/26/12 0500  PROBNP 1816.0* 1915.0*  1184.0*   CBG:  Recent Labs Lab 08/29/12 0752 08/29/12 1149 08/29/12 1732 08/29/12 2230 08/30/12 0736  GLUCAP 106* 122* 99 82 105*    Recent Results (from the past 240 hour(s))  URINE CULTURE     Status: None   Collection Time    08/24/12 12:05 PM      Result Value Range Status   Specimen Description URINE, CLEAN CATCH   Final   Special Requests NONE   Final   Culture  Setup Time 08/24/2012 22:00   Final   Colony Count >=100,000 COLONIES/ML   Final   Culture KLEBSIELLA PNEUMONIAE   Final   Report Status 08/27/2012 FINAL   Final   Organism ID, Bacteria KLEBSIELLA PNEUMONIAE   Final  MRSA PCR SCREENING  Status: None   Collection Time    08/24/12  4:00 PM      Result Value Range Status   MRSA by PCR NEGATIVE  NEGATIVE Final   Comment:            The GeneXpert MRSA Assay (FDA     approved for NASAL specimens     only), is one component of a     comprehensive MRSA colonization     surveillance program. It is not     intended to diagnose MRSA     infection nor to guide or     monitor treatment for     MRSA infections.     Studies: No results found.  Scheduled Meds: . amiodarone  400 mg Oral BID  . antiseptic oral rinse  15 mL Mouth Rinse BID  . antiseptic oral rinse  15 mL Mouth Rinse q12n4p  . aspirin EC  81 mg Oral q morning - 10a  . atorvastatin  10 mg Oral QHS  . buPROPion  150 mg Oral q morning - 10a  . cefTRIAXone (ROCEPHIN)  IV  1 g Intravenous Q24H  . chlorhexidine  15 mL Mouth Rinse BID  . cholestyramine  4 g Oral BID  . docusate  100 mg Per Tube BID  . feeding supplement  30 mL Per Tube Daily  . feeding supplement  1 Container Oral BID BM  . furosemide  60 mg Intravenous Q12H  . insulin aspart  0-15 Units Subcutaneous TID WC  . letrozole  2.5 mg Oral Daily  . levothyroxine  125 mcg Oral QAC breakfast  . megestrol  400 mg Oral BID  . metoprolol tartrate  50 mg Oral Q8H  . multivitamin with minerals  1 tablet Oral Daily  . pantoprazole sodium  40  mg Per Tube Q0600  . potassium chloride  40 mEq Oral BID  . saccharomyces boulardii  250 mg Oral BID  . sodium chloride  3 mL Intravenous Q12H  . sodium chloride  3 mL Intravenous Q12H  . vancomycin  750 mg Intravenous Q12H   Continuous Infusions: . diltiazem (CARDIZEM) infusion 5 mg/hr (08/30/12 0848)  . feeding supplement (JEVITY 1.2 CAL) 1,000 mL (08/29/12 2227)  . heparin 1,000 Units/hr (08/29/12 1910)    Principal Problem:   A-fib Active Problems:   HYPOTHYROIDISM   DIABETES MELLITUS, TYPE II   Morbid obesity   DEPRESSION   OBSTRUCTIVE SLEEP APNEA   GERD   DVT, HX OF   Liver disease, chronic, with cirrhosis   Cancer of breast   Hypokalemia   Obesity, unspecified   Severe malnutrition in the context of acute illness   Anasarca   UTI (lower urinary tract infection)   CHF, acute   Cellulitis of leg, left    Time spent: > 25 mins    Izaiah Tabb C  [facility] Pager [phone]. If 7PM-7AM, please contact night-coverage at [email or web address], password [facility] 08/30/2012, 9:04 AM  LOS: 6 days

## 2012-08-30 NOTE — Progress Notes (Addendum)
ANTICOAGULATION CONSULT NOTE - Follow-up Consult  Pharmacy Consult for IV Heparin and Warfarin Indication: Afib with RVR  Allergies  Allergen Reactions  . Olmesartan Medoxomil Cough    REACTION: ? if cough  . Tetracycline Hcl     Unknown reaction, too long for patient to remember   . Venlafaxine     REACTION: severe dry moouth  . Adhesive (Tape) Rash   Patient Measurements: Height: 5\' 4"  (162.6 cm) Weight: 205 lb 4 oz (93.1 kg) IBW/kg (Calculated) : 54.7 Heparin Dosing Weight: 66kg  Vital Signs: Temp: 98.2 F (36.8 C) (04/21 0800) Temp src: Oral (04/21 0800) BP: 101/70 mmHg (04/21 0600) Pulse Rate: 56 (04/21 0600)  Labs:  Recent Labs  08/27/12 1415 08/28/12 0525  08/29/12 0100  08/29/12 1810 08/30/12 0157 08/30/12 0530 08/30/12 0900  HGB  --   --   --  11.1*  --   --   --  11.1*  --   HCT  --   --   --  34.2*  --   --   --  34.3*  --   PLT  --   --   --  96*  --   --   --  103*  --   LABPROT 18.7* 20.6*  --   --   --   --   --   --  20.1*  INR 1.62* 1.84*  --   --   --   --   --   --  1.78*  HEPARINUNFRC  --   --   < > 0.32  < > 0.17* 0.43  --  0.34  CREATININE  --  0.76  --  0.81  --   --   --  0.69  --   < > = values in this interval not displayed.  Estimated Creatinine Clearance: 84.8 ml/min (by C-G formula based on Cr of 0.69).  Medical History: Past Medical History  Diagnosis Date  . Depression   . DVT (deep venous thrombosis)     hx of on HRT left leg ~2006  . GERD (gastroesophageal reflux disease)   . Hyperlipidemia   . Hypertension   . Hypothyroidism   . PPD positive, treated     rx inh   . OSA on CPAP   . Diabetes mellitus   . 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 Breast cancer     a. 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  PMHx  significant for breast cancer s/p mastectomy, chemotherapy with no further cycles planned, newly diagnosed Afib, CHF, malnutrition, and chronic liver disease with cirrhosis.     Cardiology consulted for Afib with RVR, started apixaban 4/15.  Apixaban d/c'd after 1 dose due to need for PEG tube placement and IV Heparin bridge started 4/15.  PEG tube placement planned for 48 hours after apixaban dose and when INR <2.   INR has been slowly rising since March and could be due to multiple factors including chronic liver disease and malnutrition.  With INR>2, heparin was d/c'd 4/15 with orders to resume once INR<2.0  4/18 INR 1.62 following PO Vitamin K x 2 days and 1 unit FFP in AM.  Radiology proceeded with PEG placement.  Post-op PEG placement, pharmacy received v/o order from radiology by RN  to restart IV heparin 4/19 am per Rx.  CBC ok, platelets chronically low - 103 this am.  No plan for Eliquis, begin Warfarin per pharmacy. Pt had 6 mo course for previous DVT.  Heparin level = 0.34 on 1000 units/hr (from 0.43 last night), INR = 1.78 this am, elevated with cirrhosis, Amiodarone, and Heparin can increase INR slightly.  Tube feed with Jevity at 65 ml/hr from 4/20.  Goal: Heparin level 0.3-0.7 INR 2-3  Plan:   Continue Heparin at 1000 units/hr   Warfarin 2mg  per tube today  Daily Heparin level, INR, CBC  Recheck Heparin level at 1800 today  Otho Bellows PharmD Pager [phone] 08/30/2012, 11:00 AM/

## 2012-08-30 NOTE — Progress Notes (Signed)
ANTICOAGULATION CONSULT NOTE - Follow Up Consult  Pharmacy Consult for IV Heparin  Indication: Afib with RVR  Allergies  Allergen Reactions  . Olmesartan Medoxomil Cough    REACTION: ? if cough  . Tetracycline Hcl     Unknown reaction, too long for patient to remember   . Venlafaxine     REACTION: severe dry moouth  . Adhesive (Tape) Rash    Patient Measurements: Height: 5\' 4"  (162.6 cm) Weight: 205 lb 4 oz (93.1 kg) IBW/kg (Calculated) : 54.7 Heparin Dosing Weight: 66kg  Vital Signs: Temp: 98.7 F (37.1 C) (04/21 1600) Temp src: Oral (04/21 1600) BP: 93/53 mmHg (04/21 1803) Pulse Rate: 95 (04/21 1803)  Labs:  Recent Labs  08/28/12 0525  08/29/12 0100  08/30/12 0157 08/30/12 0530 08/30/12 0900 08/30/12 1800  HGB  --   --  11.1*  --   --  11.1*  --   --   HCT  --   --  34.2*  --   --  34.3*  --   --   PLT  --   --  96*  --   --  103*  --   --   LABPROT 20.6*  --   --   --   --   --  20.1*  --   INR 1.84*  --   --   --   --   --  1.78*  --   HEPARINUNFRC  --   < > 0.32  < > 0.43  --  0.34 0.31  CREATININE 0.76  --  0.81  --   --  0.69  --   --   < > = values in this interval not displayed.  Estimated Creatinine Clearance: 84.8 ml/min (by C-G formula based on Cr of 0.69).   Medications:  Scheduled:  . amiodarone  400 mg Oral BID  . antiseptic oral rinse  15 mL Mouth Rinse BID  . antiseptic oral rinse  15 mL Mouth Rinse q12n4p  . aspirin EC  81 mg Oral q morning - 10a  . atorvastatin  10 mg Oral QHS  . buPROPion  150 mg Oral q morning - 10a  . [COMPLETED] cefTRIAXone (ROCEPHIN)  IV  1 g Intravenous Q24H  . chlorhexidine  15 mL Mouth Rinse BID  . cholestyramine  4 g Oral BID  . docusate  100 mg Per Tube BID  . feeding supplement  30 mL Per Tube Daily  . feeding supplement  1 Container Oral BID BM  . furosemide  60 mg Intravenous Q12H  . insulin aspart  0-15 Units Subcutaneous TID WC  . letrozole  2.5 mg Oral Daily  . levothyroxine  125 mcg Oral QAC  breakfast  . megestrol  400 mg Oral BID  . metoprolol tartrate  50 mg Per Tube Q8H  . multivitamin with minerals  1 tablet Oral Daily  . pantoprazole sodium  40 mg Per Tube Q0600  . potassium chloride  40 mEq Per Tube BID  . saccharomyces boulardii  250 mg Oral BID  . sodium chloride  3 mL Intravenous Q12H  . sodium chloride  3 mL Intravenous Q12H  . vancomycin  750 mg Intravenous Q12H  . [COMPLETED] warfarin  2 mg Oral Once  . Warfarin - Pharmacist Dosing Inpatient   Does not apply q1800  . [DISCONTINUED] digoxin  0.25 mg Intravenous Daily  . [DISCONTINUED] metoprolol  5 mg Intravenous Once  . [DISCONTINUED] metoprolol tartrate  50  mg Oral BID  . [DISCONTINUED] metoprolol tartrate  50 mg Oral Q8H  . [DISCONTINUED] potassium chloride  40 mEq Oral BID   Infusions:  . diltiazem (CARDIZEM) infusion 10 mg/hr (08/30/12 1110)  . feeding supplement (JEVITY 1.2 CAL) 1,000 mL (08/30/12 1813)  . heparin 1,000 Units/hr (08/30/12 1159)  . [DISCONTINUED] diltiazem (CARDIZEM) infusion Stopped (08/28/12 0807)   PRN: sodium chloride, acetaminophen, acetaminophen, alum & mag hydroxide-simeth, lidocaine-prilocaine, midazolam, ondansetron (ZOFRAN) IV, ondansetron, ondansetron, oxyCODONE-acetaminophen, polyethylene glycol, sodium chloride, sorbitol  Assessment: 76 yoF admitted with LE swelling, acute CHF exacerbation with anasarca. PMHx significant for breast cancer s/p mastectomy, chemotherapy with no further cycles planned, newly diagnosed Afib, CHF, malnutrition, and chronic liver disease with cirrhosis.  Cardiology consulted for Afib with RVR, started apixaban 4/15. Apixaban d/c'd after 1 dose due to need for PEG tube placement and IV Heparin bridge started 4/15. PEG tube placement planned for 48 hours after apixaban dose and when INR <2.  Warfarin started today. Heparin level= 0.31 on 1000 units/hour at 1800 4/21. Goal of Therapy:  INR 2-3 Heparin level 0.3-0.7 units/ml    Plan:   Increase  Heparin to 1100 Units/hour.  Making increase to maintain therapeutic heparin level 0.3-0.7 as this level has been trending down.  Recheck HL and other labs in am.   Loletta Specter 08/30/2012,7:25 PM

## 2012-08-30 NOTE — Progress Notes (Signed)
Physical Therapy Treatment Patient Details Name: [name] MRN: [id number] DOB: 07-17-1955 Today's Date: 08/30/2012 Time: 4098-1191 PT Time Calculation (min): 24 min  PT Assessment / Plan / Recommendation Comments on Treatment Session  VSS this session although pt was started on cardiziem drip earlier due to RVR; RN present during tx    Follow Up Recommendations  Home health PT;Supervision/Assistance - 24 hour     Does the patient have the potential to tolerate intense rehabilitation     Barriers to Discharge        Equipment Recommendations  None recommended by PT    Recommendations for Other Services    Frequency Min 3X/week   Plan Discharge plan remains appropriate;Frequency remains appropriate    Precautions / Restrictions Precautions Precautions: Fall Precaution Comments: Monitor HR   Pertinent Vitals/Pain     Mobility  Bed Mobility Bed Mobility: Supine to Sit;Sitting - Scoot to Edge of Bed Supine to Sit: HOB elevated;1: +2 Total assist Supine to Sit: Patient Percentage: 50% Sitting - Scoot to Edge of Bed: 1: +2 Total assist Sitting - Scoot to Edge of Bed: Patient Percentage: 50% Details for Bed Mobility Assistance: assist for UB/LB, bed pad used for scooting Transfers Transfers: Sit to Stand;Stand to Sit;Stand Pivot Transfers Sit to Stand: From elevated surface;1: +2 Total assist;From bed;With upper extremity assist Sit to Stand: Patient Percentage: 70% Stand to Sit: 1: +2 Total assist;To chair/3-in-1;To bed;With upper extremity assist Stand to Sit: Patient Percentage: 70% Stand Pivot Transfers: 1: +2 Total assist Stand Pivot Transfers: Patient Percentage: 70% Details for Transfer Assistance: verbal cues for technique and safety; +2 lines, safety Ambulation/Gait Ambulation/Gait Assistance: 1: +2 Total assist Ambulation/Gait: Patient Percentage: 60% Ambulation Distance (Feet): 7 Feet Assistive device: Rolling walker Ambulation/Gait Assistance Details:  multimodal cues for technique, RW safety,; pt requires increased time Gait Pattern: Step-to pattern;Wide base of support    Exercises Total Joint Exercises Ankle Circles/Pumps: AROM;Both;10 reps Quad Sets: AROM;10 reps   PT Diagnosis:    PT Problem List:   PT Treatment Interventions:     PT Goals Acute Rehab PT Goals Time For Goal Achievement: 09/09/12 Potential to Achieve Goals: Fair Pt will go Supine/Side to Sit: with supervision PT Goal: Supine/Side to Sit - Progress: Progressing toward goal Pt will go Sit to Stand: with supervision PT Goal: Sit to Stand - Progress: Progressing toward goal Pt will go Stand to Sit: with supervision PT Goal: Stand to Sit - Progress: Progressing toward goal Pt will Transfer Bed to Chair/Chair to Bed: with min assist PT Transfer Goal: Bed to Chair/Chair to Bed - Progress: Progressing toward goal Pt will Ambulate: 51 - 150 feet;with min assist;with least restrictive assistive device PT Goal: Ambulate - Progress: Progressing toward goal  Visit Information  Last PT Received On: 08/30/12 Assistance Needed: +2    Subjective Data  Subjective: do you think i will be able to go home Patient Stated Goal: home with family assist    Cognition  Cognition Arousal/Alertness: Awake/alert Behavior During Therapy: Anxious Overall Cognitive Status: Within Functional Limits for tasks assessed General Comments: slower response time but better    Balance  Static Sitting Balance Static Sitting - Balance Support: Bilateral upper extremity supported Static Sitting - Level of Assistance: 4: Min assist;3: Mod assist;5: Stand by assistance  End of Session PT - End of Session Activity Tolerance: Patient tolerated treatment well Patient left: in chair;with call bell/phone within reach;with nursing in room   GP     [facility] 08/30/2012,  3:47 PM

## 2012-08-31 LAB — GLUCOSE, CAPILLARY: Glucose-Capillary: 123 mg/dL — ABNORMAL HIGH (ref 70–99)

## 2012-08-31 LAB — CBC
HCT: 32.8 % — ABNORMAL LOW (ref 36.0–46.0)
Platelets: 117 10*3/uL — ABNORMAL LOW (ref 150–400)
RDW: 23.6 % — ABNORMAL HIGH (ref 11.5–15.5)
WBC: 3.5 10*3/uL — ABNORMAL LOW (ref 4.0–10.5)

## 2012-08-31 LAB — BASIC METABOLIC PANEL
CO2: 34 mEq/L — ABNORMAL HIGH (ref 19–32)
Calcium: 7.4 mg/dL — ABNORMAL LOW (ref 8.4–10.5)
Glucose, Bld: 121 mg/dL — ABNORMAL HIGH (ref 70–99)
Sodium: 135 mEq/L (ref 135–145)

## 2012-08-31 MED ORDER — WARFARIN SODIUM 2 MG PO TABS
2.0000 mg | ORAL_TABLET | Freq: Once | ORAL | Status: AC
  Administered 2012-08-31: 2 mg
  Filled 2012-08-31: qty 1

## 2012-08-31 MED ORDER — SODIUM CHLORIDE 0.9 % IV BOLUS (SEPSIS)
250.0000 mL | Freq: Once | INTRAVENOUS | Status: AC
  Administered 2012-08-31: 250 mL via INTRAVENOUS

## 2012-08-31 MED ORDER — PRO-STAT SUGAR FREE PO LIQD
30.0000 mL | Freq: Two times a day (BID) | ORAL | Status: DC
  Administered 2012-08-31 – 2012-09-02 (×4): 30 mL
  Filled 2012-08-31 (×5): qty 30

## 2012-08-31 NOTE — Progress Notes (Signed)
Occupational Therapy Treatment Patient Details Name: [name] MRN: [id number] DOB: 03-07-1956 Today's Date: 08/31/2012 Time: 1014-1050 OT Time Calculation (min): 36 min  OT Assessment / Plan / Recommendation Comments on Treatment Session Pt continues to have decreased activity tolerance and only able to get up to chair today. She didnt feel up to performing grooming, etc.     Follow Up Recommendations  Supervision/Assistance - 24 hour;Home health OT (family doesnt want SNF)    Barriers to Discharge       Equipment Recommendations  3 in 1 bedside comode    Recommendations for Other Services    Frequency Min 2X/week   Plan Discharge plan remains appropriate    Precautions / Restrictions          ADL  Toilet Transfer: Simulated;+2 Total assistance (bed to chair) Toilet Transfer: Patient Percentage: 70% Toilet Transfer Method: Stand pivot ADL Comments: Pt wanting to do more activity but she does have limited activity tolerance. She was too fatigued after getting up to chair to do grooming or other ADL and requested to rest. Explained that we will continue to work on progressing activity as pt able.  Nursing present for session.     OT Diagnosis:    OT Problem List:   OT Treatment Interventions:     OT Goals ADL Goals ADL Goal: Toilet Transfer - Progress: Not progressing (pt fatigued today)  Visit Information  Last OT Received On: 08/31/12 Assistance Needed: +2    Subjective Data  Subjective: Am I going to get up to the chair? Patient Stated Goal: wants to get up and do more activity   Prior Functioning       Cognition  Cognition Arousal/Alertness: Awake/alert Overall Cognitive Status: Within Functional Limits for tasks assessed    Mobility  Bed Mobility Bed Mobility: Supine to Sit Supine to Sit: 1: +2 Total assist;HOB elevated Supine to Sit: Patient Percentage: 50% Transfers Transfers: Stand to Sit;Sit to Stand Sit to Stand: 1: +2 Total assist;From  bed;With upper extremity assist Sit to Stand: Patient Percentage: 70% Stand to Sit: 1: +2 Total assist Stand to Sit: Patient Percentage: 70% Details for Transfer Assistance: verbal cues for hand placement. Assist to rise and stabilize and also to control descent to sit. +2 for lines and safety    Exercises      Balance     End of Session OT - End of Session Activity Tolerance: Patient limited by fatigue Patient left: in chair;with call bell/phone within reach;with family/visitor present  GO     Lennox Laity [phone] 08/31/2012, 11:13 AM

## 2012-08-31 NOTE — Progress Notes (Signed)
NUTRITION FOLLOW UP  Intervention:   Patient has G-tube in place. Jevity 1.2 is infusing @ 65 ml/hr. 30 ml Prostat via tube daily. Tube feeding regimen currently providing 1972 kcal, 102 grams protein, and 1264 ml H2O.   Total free water: 1264 ml per day. Residuals: 0-5 ml Last bm: 4/21  RD to increase Prostat to BID due to wounds/increased protein needs  Recommend transitioning pt to bolus tube feeds prior to discharge.  Bolus TF recommendations: Jevity 1.2, 6 cans daily with 30 ml Prostat TID to provide 2010 kcal, 124 grams of protein, and 1146 ml of water. Recommend 100 ml water flushes before and after each bolus feed.   Nutrition Dx:   Inadequate oral intake related to no appetite as evidenced by PEG feedings.; ongoing  Goal:   Pt to meet >/= 90% of their estimated nutrition needs; being met  Monitor:   TF; at goal rate and pt tolerating well Wt; no new wt since 4/19 Labs;   Assessment:   Pt reports that she is feeling better and denies and nausea or abdominal pain with tube feeds. Discussed pt's options for cyclic feeds and bolus feeds but, pt stated she would rather decide after she knows where she will be going after discharge. Per MD note "would suggest switching to bolus feeds 24-48 hours prior to planned d/c so family can learn how to handle PEG". Per case management note the plan is for patient to go home with home health services. See RD  bolus TF recommendations for discharge above.   Height: Ht Readings from Last 1 Encounters:  08/24/12 5\' 4"  (1.626 m)    Weight Status:   Wt Readings from Last 1 Encounters:  08/28/12 205 lb 4 oz (93.1 kg)    Re-estimated needs:  Kcal: 1950-2100 kcal  Protein: 112-140 grams g  Fluid: 2.5 L  Skin: +2 RLE and LLE edema, non-pitting RUE and LUE edema; stage 2 pressure ulcer on buttocks and sacrum  Diet Order: NPO   Intake/Output Summary (Last 24 hours) at 08/31/12 1557 Last data filed at 08/31/12 1500  Gross per 24 hour   Intake 2222.69 ml  Output   1955 ml  Net 267.69 ml    Last BM: 4/21   Labs:   Recent Labs Lab 08/25/12 0400  08/27/12 2113  08/29/12 0100 08/30/12 0530 08/31/12 0555  NA 136  < >  --   < > 135 134* 135  K 2.7*  < > 2.9*  < > 3.8 3.3* 3.7  CL 100  < >  --   < > 100 98 99  CO2 25  < >  --   < > 29 32 34*  BUN 11  < >  --   < > 14 13 14   CREATININE 0.80  < >  --   < > 0.81 0.69 0.68  CALCIUM 7.2*  < >  --   < > 7.4* 7.3* 7.4*  MG 1.6  --  1.7  --  1.8  --   --   GLUCOSE 108*  < >  --   < > 96 100* 121*  < > = values in this interval not displayed.  CBG (last 3)   Recent Labs  08/30/12 2204 08/31/12 0753 08/31/12 1209  GLUCAP 119* 123* 129*    Scheduled Meds: . amiodarone  400 mg Oral BID  . antiseptic oral rinse  15 mL Mouth Rinse BID  . antiseptic oral rinse  15  mL Mouth Rinse q12n4p  . aspirin EC  81 mg Oral q morning - 10a  . atorvastatin  10 mg Oral QHS  . buPROPion  150 mg Oral q morning - 10a  . chlorhexidine  15 mL Mouth Rinse BID  . cholestyramine  4 g Oral BID  . docusate  100 mg Per Tube BID  . feeding supplement  30 mL Per Tube Daily  . feeding supplement  1 Container Oral BID BM  . furosemide  60 mg Intravenous Q12H  . insulin aspart  0-15 Units Subcutaneous TID WC  . letrozole  2.5 mg Oral Daily  . levothyroxine  125 mcg Oral QAC breakfast  . megestrol  400 mg Oral BID  . metoprolol tartrate  50 mg Per Tube Q8H  . multivitamin with minerals  1 tablet Oral Daily  . pantoprazole sodium  40 mg Per Tube Q0600  . saccharomyces boulardii  250 mg Oral BID  . sodium chloride  3 mL Intravenous Q12H  . sodium chloride  3 mL Intravenous Q12H  . vancomycin  750 mg Intravenous Q12H  . warfarin  2 mg Per Tube ONCE-1800  . Warfarin - Pharmacist Dosing Inpatient   Does not apply q1800    Continuous Infusions: . diltiazem (CARDIZEM) infusion Stopped (08/31/12 0106)  . feeding supplement (JEVITY 1.2 CAL) 1,000 mL (08/31/12 1455)  . heparin 1,100  Units/hr (08/31/12 1029)    Ian Malkin RD, LDN Inpatient Clinical Dietitian Pager: [phone] After Hours Pager: [phone]

## 2012-08-31 NOTE — Progress Notes (Signed)
ANTICOAGULATION CONSULT NOTE - Follow-up Consult  Pharmacy Consult for IV Heparin and Warfarin Indication: Afib with RVR  Allergies  Allergen Reactions  . Olmesartan Medoxomil Cough    REACTION: ? if cough  . Tetracycline Hcl     Unknown reaction, too long for patient to remember   . Venlafaxine     REACTION: severe dry moouth  . Adhesive (Tape) Rash   Patient Measurements: Height: 5\' 4"  (162.6 cm) Weight: 205 lb 4 oz (93.1 kg) IBW/kg (Calculated) : 54.7 Heparin Dosing Weight: 66kg  Vital Signs: Temp: 98.7 F (37.1 C) (04/22 0400) Temp src: Oral (04/22 0400) BP: 99/54 mmHg (04/22 0600) Pulse Rate: 119 (04/22 0600)  Labs:  Recent Labs  08/29/12 0100  08/30/12 0530 08/30/12 0900 08/30/12 1800 08/31/12 0555  HGB 11.1*  --  11.1*  --   --   --   HCT 34.2*  --  34.3*  --   --   --   PLT 96*  --  103*  --   --   --   LABPROT  --   --   --  20.1*  --  19.1*  INR  --   --   --  1.78*  --  1.66*  HEPARINUNFRC 0.32  < >  --  0.34 0.31 0.44  CREATININE 0.81  --  0.69  --   --  0.68  < > = values in this interval not displayed.  Estimated Creatinine Clearance: 84.8 ml/min (by C-G formula based on Cr of 0.68).  Assessment:  14 yoF admitted with LE swelling, acute CHF exacerbation with anasarca.  PMHx significant for breast cancer s/p mastectomy, chemotherapy with no further cycles planned, newly diagnosed Afib, CHF, malnutrition, and chronic liver disease with cirrhosis.     Heparin level therapeutic today (0.44) on 1100 units/hr  INR 1.66 today, warfarin started last night via tube; may demonstrate sensitivity due to chronic liver disease  CBC this am pending, platelets have been low s/p chemotherpy  No bleeding reported per notes  Potential drug interaction with amiodarone noted, also on aspirin 81mg  daily  Goal: Heparin level 0.3-0.7 INR 2-3  Plan:   Continue Heparin at 1100 units/hr   Repeat Warfarin 2mg  per tube today  Daily Heparin level, INR,  CBC  Provide Warfarin education prior to discharge   Loralee Pacas, PharmD, BCPS Pager: [phone]  08/31/2012, 7:10 AM/

## 2012-08-31 NOTE — Progress Notes (Signed)
Spoke with patient's husband who states the plan is for patient to go home with home health services. Mrs Sagrero is active with [facility]. She has been active with the [facility] diabetes management program through [facility]. Mrs Hamstra recently did not have a pleasant experience at [facility] after last hospital discharge. Will continue to follow while in hospital setting.  Raiford Noble, MSN- Julaine Fusi, [facility] [phone]

## 2012-08-31 NOTE — Progress Notes (Signed)
ANTIBIOTIC CONSULT NOTE - FOLLOW UP  Pharmacy Consult for Vancomycin Indication: LLE Cellulitis  Allergies  Allergen Reactions  . Olmesartan Medoxomil Cough    REACTION: ? if cough  . Tetracycline Hcl     Unknown reaction, too long for patient to remember   . Venlafaxine     REACTION: severe dry moouth  . Adhesive (Tape) Rash    Patient Measurements: Height: 5\' 4"  (162.6 cm) Weight: 205 lb 4 oz (93.1 kg) IBW/kg (Calculated) : 54.7  Vital Signs: Temp: 98.6 F (37 C) (04/22 0800) Temp src: Oral (04/22 0800) BP: 87/52 mmHg (04/22 0700) Pulse Rate: 99 (04/22 0700) Intake/Output from previous day: 04/21 0701 - 04/22 0700 In: 4464.2 [P.O.:100; I.V.:2384.2; IV Piggyback:350] Out: 3850 [Urine:3850] Intake/Output from this shift:    Labs:  Recent Labs  08/29/12 0100 08/30/12 0530 08/31/12 0555 08/31/12 0830  WBC 6.4 3.4*  --  3.5*  HGB 11.1* 11.1*  --  10.7*  PLT 96* 103*  --  117*  CREATININE 0.81 0.69 0.68  --    Estimated Creatinine Clearance: 84.8 ml/min (by C-G formula based on Cr of 0.68).  Recent Labs  08/28/12 1124 08/29/12 0100  VANCOTROUGH 27.7*  --   VANCORANDOM  --  22.0     Microbiology: 4/15 urine: klebsiella pneumoniae (S- cefazolin, ceftriaxone, cipro, gent, levaquin, zosyn, tobra, septra; R- ampicillin, I- NTF)  4/15 MRSA PCR (-)  Antibiotics: 4/15 >> Ceftriaxone >> 4/21 4/16 >> Vancomycin >>  Assessment: 52 yoF admitted with acute exacerbation CHF, with hx breast CA, severe malnutrition with plans for PEG placement, new Afib, also found to have LLE cellulitis and UTI.  Day #7 vancomycin 750mg  IV q12h. Multiple dose adjustments have been made based on elevated troughs.  Completed 7 days ceftriaxone for Klebsiella UTI  Renal function has been stable, CrCl ~85 ml/min  WBC have been normal, now low today at 3.5k  Afebrile   Goal of Therapy:  Vancomycin trough level 10-15 mcg/ml  Plan:   Check vancomycin trough tonight prior to  4th dose of new regimen Follow up renal function & cultures Follow up duration of therapy  Loralee Pacas, PharmD, BCPS Pager: [phone] 08/31/2012,10:01 AM

## 2012-08-31 NOTE — Progress Notes (Addendum)
[facility] PROGRESS NOTE  [name] ZOX:[id number] DOB: [birth year] DOA: 08/24/2012 PCP: [name]., NP  Assessment/Plan: #1 A. fib with RVR Questionable etiology. May be secondary to stress and severe malnutrition and ongoing cancer. Cardiac enzymes are negative x3. Pro BNP is elevated. 2-D echo is pending.  -Patient was started on eliquis for anticoagulation per cardiology in anticipation of possible cardioversion, but eliquis was dc'ed and pt initially placed patient on full dose IV heparin in anticipation of possible PEG tube for her severe malnutrition, but due elevated INR, the heparin was dc'ed pending INR dropping to <2 then it will be resumed. -INR is1.89 on 4/18 following vit k, discussed with IR- she was given FFP to bring INR closer to 1.5  and PEG placement 4/18  -continue amiodarone and metoprolol per cards, cardizem drip was dc'ed 4/18, dig was subsequently started but HR control was still poor so it was dc'ed 4/21 -antihypertensives have had held as appropriate secondary to hypotension, holding off IVF given her anasarca -Rate still poorly controlled last 4/20, started on Cardizem drip 4/21 but stopped overnight due to hypotension. Oral cardizem to be started as BP tolerates if HR consistently>110. Cardiology to follow and consider cardioversion pending clinical course -further anticoagulation after PEG(done by Radiologist) per cardiology>> patient on Coumadin started 4/21 per cardiology -appreciate the input and recommendations.   #2  Anarsarca/acute CHF exacerbation Likely secondary to severe malnutrition in the setting of afib with RVR likely leading to acute CHF exacerbation with pulmonary edema. Patient's albumin is 1.6. Chest x-ray consistent with pulmonary edema. - continue current IV Lasix, better diuresis overnight 4/20.   -continue Strict I.'s and O.'s. Daily weights.  - 2-D echo with nl EF 55-60%, no wall motion abnormalities.  -Cardiology is  following and appreciate assistance  #3 kleb urinary tract infection -kleb sensitive to Rocephin-day 7/7, d/c'ed 4/21  #4 left lower extremity cellulitis Improving,  Continue IV vancomycin.  #5 gastroesophageal reflux disease Continue PPI.  #6 type 2 diabetes Hemoglobin A1c 6.1. Continue sliding scale insulin.  #7 severe malnutrition/PEG placement Patient with 6-8 weeks of poor oral intake, decreased appetite. Megace started 4/15.  - s/p PEG tube placement 4/18 as above, continue tube feedings started 4/19 -patient tolerating well  -Discussed with patient's oncologist Dr. [name]- per Dr.Thompson  #8 history of breast cancer status post right mastectomy Pathologic staging PET 3, P. and 1, stage IIIa. ER and PR positive at 100%, HER -2/neu negative. Per oncology. -appreciate Dr Magrinat's input  #9 chronic liver disease -Continue diuretics. -contributing to likely underlying coagulopathy  #10 hypokalemia Secondary to diuretics.  -Repleted, continue to follow  #11 prophylaxis PPI for GI prophylaxis. Heparin for DVT prophylaxis.     Code Status: Full Family Communication: Updated patient and husband at bedside. Disposition Plan: Home when medically stable.   Consultants:  Cardiology: Dr. Graciela Husbands 08/24/2012  Oncology Dr. [name] 08/24/2012  Procedures:  2-D echo  08/26/2038  Chest x-ray 08/24/2012, 08/25/2012  Antibiotics:  IV Rocephin 08/24/12>>4/21  IV Vancomycin 08/25/12  HPI/Subjective: States she feels better this am. She denies chest pain or shortness of breath. Objective: Filed Vitals:   08/31/12 1030 08/31/12 1100 08/31/12 1200 08/31/12 1215  BP:  114/69 109/71   Pulse: 114 117 118 92  Temp:      TempSrc:      Resp:  19 17   Height:      Weight:      SpO2: 97% 97% 98%     Intake/Output Summary (  Last 24 hours) at 08/31/12 1301 Last data filed at 08/31/12 1200  Gross per 24 hour  Intake 2334.69 ml  Output   2050 ml  Net 284.69 ml    Filed Weights   08/27/12 0400 08/27/12 0458 08/28/12 0500  Weight: 92.1 kg (203 lb 0.7 oz) 92.1 kg (203 lb 0.7 oz) 93.1 kg (205 lb 4 oz)    Exam:   General:  NAD. Alert and orientedx3  Cardiovascular: Irregularly irregular,mildy tachy  Respiratory:decreased BS at bases, no crackles and no wheezes  Abdomen: Soft/NT/ND/+BS  Extremities: No c/c. +2-3 BLE edema to hips. Left lower leg near the ankle with mild area of erythema.  Data Reviewed: Basic Metabolic Panel:  Recent Labs Lab 08/25/12 0400  08/27/12 0355 08/27/12 2113 08/28/12 0525 08/29/12 0100 08/30/12 0530 08/31/12 0555  NA 136  < > 133*  --  133* 135 134* 135  K 2.7*  < > 3.1* 2.9* 3.3* 3.8 3.3* 3.7  CL 100  < > 97  --  98 100 98 99  CO2 25  < > 31  --  30 29 32 34*  GLUCOSE 108*  < > 101*  --  79 96 100* 121*  BUN 11  < > 14  --  14 14 13 14   CREATININE 0.80  < > 0.83  --  0.76 0.81 0.69 0.68  CALCIUM 7.2*  < > 7.4*  --  7.5* 7.4* 7.3* 7.4*  MG 1.6  --   --  1.7  --  1.8  --   --   < > = values in this interval not displayed. Liver Function Tests:  Recent Labs Lab 08/26/12 0500  AST 41*  ALT 39*  ALKPHOS 220*  BILITOT 0.5  PROT 5.2*  ALBUMIN 1.6*   No results found for this basename: LIPASE, AMYLASE,  in the last 168 hours No results found for this basename: AMMONIA,  in the last 168 hours CBC:  Recent Labs Lab 08/26/12 0500 08/27/12 0355 08/29/12 0100 08/30/12 0530 08/31/12 0830  WBC 7.9 6.8 6.4 3.4* 3.5*  NEUTROABS 5.9  --   --   --   --   HGB 12.1 11.4* 11.1* 11.1* 10.7*  HCT 36.1 33.7* 34.2* 34.3* 32.8*  MCV 80.9 81.4 84.0 84.5 85.0  PLT 120* 113* 96* 103* 117*   Cardiac Enzymes:  Recent Labs Lab 08/24/12 1640 08/24/12 2200 08/25/12 0400  TROPONINI <0.30 <0.30 <0.30   BNP (last 3 results)  Recent Labs  08/24/12 1225 08/25/12 0400 08/26/12 0500  PROBNP 1816.0* 1915.0* 1184.0*   CBG:  Recent Labs Lab 08/30/12 0736 08/30/12 1213 08/30/12 1722 08/30/12 2204  08/31/12 0753  GLUCAP 105* 161* 126* 119* 123*    Recent Results (from the past 240 hour(s))  URINE CULTURE     Status: None   Collection Time    08/24/12 12:05 PM      Result Value Range Status   Specimen Description URINE, CLEAN CATCH   Final   Special Requests NONE   Final   Culture  Setup Time 08/24/2012 22:00   Final   Colony Count >=100,000 COLONIES/ML   Final   Culture KLEBSIELLA PNEUMONIAE   Final   Report Status 08/27/2012 FINAL   Final   Organism ID, Bacteria KLEBSIELLA PNEUMONIAE   Final  MRSA PCR SCREENING     Status: None   Collection Time    08/24/12  4:00 PM      Result Value Range Status  MRSA by PCR NEGATIVE  NEGATIVE Final   Comment:            The GeneXpert MRSA Assay (FDA     approved for NASAL specimens     only), is one component of a     comprehensive MRSA colonization     surveillance program. It is not     intended to diagnose MRSA     infection nor to guide or     monitor treatment for     MRSA infections.     Studies: No results found.  Scheduled Meds: . amiodarone  400 mg Oral BID  . antiseptic oral rinse  15 mL Mouth Rinse BID  . antiseptic oral rinse  15 mL Mouth Rinse q12n4p  . aspirin EC  81 mg Oral q morning - 10a  . atorvastatin  10 mg Oral QHS  . buPROPion  150 mg Oral q morning - 10a  . chlorhexidine  15 mL Mouth Rinse BID  . cholestyramine  4 g Oral BID  . docusate  100 mg Per Tube BID  . feeding supplement  30 mL Per Tube Daily  . feeding supplement  1 Container Oral BID BM  . furosemide  60 mg Intravenous Q12H  . insulin aspart  0-15 Units Subcutaneous TID WC  . letrozole  2.5 mg Oral Daily  . levothyroxine  125 mcg Oral QAC breakfast  . megestrol  400 mg Oral BID  . metoprolol tartrate  50 mg Per Tube Q8H  . multivitamin with minerals  1 tablet Oral Daily  . pantoprazole sodium  40 mg Per Tube Q0600  . saccharomyces boulardii  250 mg Oral BID  . sodium chloride  3 mL Intravenous Q12H  . sodium chloride  3 mL  Intravenous Q12H  . vancomycin  750 mg Intravenous Q12H  . warfarin  2 mg Per Tube ONCE-1800  . Warfarin - Pharmacist Dosing Inpatient   Does not apply q1800   Continuous Infusions: . diltiazem (CARDIZEM) infusion Stopped (08/31/12 0106)  . feeding supplement (JEVITY 1.2 CAL) 1,000 mL (08/30/12 1813)  . heparin 1,100 Units/hr (08/31/12 1029)    Principal Problem:   A-fib Active Problems:   HYPOTHYROIDISM   DIABETES MELLITUS, TYPE II   Morbid obesity   DEPRESSION   OBSTRUCTIVE SLEEP APNEA   GERD   DVT, HX OF   Liver disease, chronic, with cirrhosis   Cancer of breast   Hypokalemia   Obesity, unspecified   Severe malnutrition in the context of acute illness   Anasarca   UTI (lower urinary tract infection)   CHF, acute   Cellulitis of leg, left    Time spent: > 25 mins    Lawerance Matsuo C  [facility] Pager [phone]. If 7PM-7AM, please contact night-coverage at [email or web address], password [facility] 08/31/2012, 1:01 PM  LOS: 7 days

## 2012-08-31 NOTE — Clinical Social Work Note (Signed)
CSW reviewed chart and spoke with RN. Per medical team, Pt has been depressed. CSW was going to see for support and assess needs, but then getting procedure. CSW to check on Pt in the am.   Doreen Salvage, LCSW ICU/Stepdown Clinical Social Worker [facility] Cell [phone] Hours 8am-1200pm M-F

## 2012-08-31 NOTE — Progress Notes (Signed)
COURTESY NOTE:  Rennie seems to be turning the corner. Sitting up more, participating in OT/PT. Had a "good" BM today. Tolerating PEG feeds well.  Would likely find it easier at home to do bolus feeds rather than continuous. Husband willing to learn how to do flushes and feeds once changed to bolus  She is scheduled to see Radiation Oncology May 1st to consider post-mastectomy irradiation. In the meantime she continues on letrozole with good tolerance  SUMMARY:  57 y.o. [name] admitted with A Fib/ RVR  (1) status post right mastectomy under the care of Dr. 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 Lymphovascular perineural invasion was identified. 2 of 18 lymph nodes were involved. Margins were clear. Pathologic staging pT3, pN1a, stage IIIA.   (2) treated in the adjuvant setting with docetaxel/cyclophosphamide given every 3 weeks, with Neulasta on day 2 for granulocyte support. First treatment was given on 07/06/2012. Due to intolerance, we decreased her doses by 15% beginning cycle 2, given 07/29/2012. However, tolerance remained poor and no further chemotherapy is planned .  (3) patient will need postmastectomy radiation--to see Dr Michell Heinrich  09/09/2012  (4) letrozole started April 2014, plan is to continue for 5 years   (4) comorbidities include diabetes, hypertension, chronic liver disease, and c diff negative diarrhea (resolved)  (5) malnutrition: s/p PEG placement; .would suggest switching to bolus feeds 24-48 hours prior to planned d/c so family can learn how to handle PEG   Appreciate your help to this patient!

## 2012-08-31 NOTE — Progress Notes (Signed)
    Subjective:  No CP or dyspnea. Feels well today. Eager to get out of bed.  Objective:  Vital Signs in the last 24 hours: Temp:  [98.2 F (36.8 C)-98.7 F (37.1 C)] 98.7 F (37.1 C) (04/22 0400) Pulse Rate:  [65-119] 99 (04/22 0700) Resp:  [10-22] 10 (04/22 0700) BP: (73-108)/(35-72) 87/52 mmHg (04/22 0700) SpO2:  [93 %-99 %] 98 % (04/22 0700)  Intake/Output from previous day: 04/21 0701 - 04/22 0700 In: 4464.2 [P.O.:100; I.V.:2384.2; IV Piggyback:350] Out: 3850 [Urine:3850]  Physical Exam: Pt is alert and oriented, chronically ill-appearing woman in NAD HEENT: normal Neck: JVP - mildly increased Lungs: diminished in bases CV: irregularly irregular with grade 2/6 systolic murmur at LSB Abd: soft, NT, Positive BS, no hepatomegaly Ext: diffuse edema 2+ Skin: warm/dry no rash   Lab Results:  Recent Labs  08/29/12 0100 08/30/12 0530  WBC 6.4 3.4*  HGB 11.1* 11.1*  PLT 96* 103*    Recent Labs  08/30/12 0530 08/31/12 0555  NA 134* 135  K 3.3* 3.7  CL 98 99  CO2 32 34*  GLUCOSE 100* 121*  BUN 13 14  CREATININE 0.69 0.68   No results found for this basename: TROPONINI, CK, MB,  in the last 72 hours  Tele: Atrial fibrillation, heart rate 90's-100's  Assessment/Plan:  Atrial fib with RVR - improved today. Heart rate better-controlled with addition of IV dilt, but had to be d/c'd overnight because of hypotension (pt was sleeping). Recommend continue metoprolol 50 mg Q8 hours and amiodarone 400 mg BID. Add oral dilt only if heart rate consistently over 110 bpm (may be difficult because of low BP). Continue heparin/warfarin. Depending on her progress over next 24 hours, will consider TEE/cardioversion but I didn't broach this with the patient and her husband this morning.  Tonny Bollman, M.D. 08/31/2012, 8:02 AM

## 2012-08-31 NOTE — Progress Notes (Signed)
Spoke with pt regarding cpap.  Pt refused cpap tonight, prefers to wear nasal cannula.  Pt was advised that RT available all night should she change her mind.

## 2012-09-01 LAB — CBC
Platelets: 118 10*3/uL — ABNORMAL LOW (ref 150–400)
RDW: 24 % — ABNORMAL HIGH (ref 11.5–15.5)
WBC: 3.6 10*3/uL — ABNORMAL LOW (ref 4.0–10.5)

## 2012-09-01 LAB — PROTIME-INR
INR: 1.59 — ABNORMAL HIGH (ref 0.00–1.49)
Prothrombin Time: 18.5 seconds — ABNORMAL HIGH (ref 11.6–15.2)

## 2012-09-01 LAB — GLUCOSE, CAPILLARY
Glucose-Capillary: 104 mg/dL — ABNORMAL HIGH (ref 70–99)
Glucose-Capillary: 109 mg/dL — ABNORMAL HIGH (ref 70–99)

## 2012-09-01 LAB — HEPARIN LEVEL (UNFRACTIONATED): Heparin Unfractionated: 0.33 IU/mL (ref 0.30–0.70)

## 2012-09-01 MED ORDER — SODIUM CHLORIDE 0.9 % IJ SOLN: 10.0000 mL | Freq: Two times a day (BID) | INTRAMUSCULAR | Status: DC

## 2012-09-01 MED ORDER — SODIUM CHLORIDE 0.9 % IJ SOLN
10.0000 mL | INTRAMUSCULAR | Status: DC | PRN
  Administered 2012-09-02 – 2012-09-06 (×3): 10 mL

## 2012-09-01 MED ORDER — METOPROLOL TARTRATE 50 MG PO TABS
50.0000 mg | ORAL_TABLET | Freq: Three times a day (TID) | ORAL | Status: DC
  Administered 2012-09-01 – 2012-09-03 (×6): 50 mg via ORAL
  Filled 2012-09-01 (×9): qty 1

## 2012-09-01 MED ORDER — DILTIAZEM HCL 30 MG PO TABS
30.0000 mg | ORAL_TABLET | Freq: Four times a day (QID) | ORAL | Status: DC
  Administered 2012-09-01 (×4): 30 mg via ORAL
  Filled 2012-09-01 (×8): qty 1

## 2012-09-01 MED ORDER — WARFARIN SODIUM 4 MG PO TABS
4.0000 mg | ORAL_TABLET | Freq: Once | ORAL | Status: AC
  Administered 2012-09-01: 4 mg
  Filled 2012-09-01: qty 1

## 2012-09-01 NOTE — Progress Notes (Signed)
Patient requesting CPAP titration due to air being "too cold". Humidification adjustments made. Patient placed on auto titration. Tolerating well at this time.

## 2012-09-01 NOTE — Progress Notes (Signed)
ANTIBIOTIC CONSULT NOTE - FOLLOW UP  Pharmacy Consult for vancomycin Indication: LLE Cellulitis   Allergies  Allergen Reactions  . Olmesartan Medoxomil Cough    REACTION: ? if cough  . Tetracycline Hcl     Unknown reaction, too long for patient to remember   . Venlafaxine     REACTION: severe dry moouth  . Adhesive (Tape) Rash    Patient Measurements: Height: 5\' 4"  (162.6 cm) Weight: 205 lb 4 oz (93.1 kg) IBW/kg (Calculated) : 54.7 Adjusted Body Weight:   Vital Signs: Temp: 98.9 F (37.2 C) (04/23 0000) Temp src: Oral (04/23 0000) BP: 80/62 mmHg (04/23 0100) Pulse Rate: 101 (04/23 0100) Intake/Output from previous day: 04/22 0701 - 04/23 0700 In: 1660 [I.V.:470; IV Piggyback:150] Out: 1080 [Urine:1080] Intake/Output from this shift: Total I/O In: 480 [I.V.:155; Other:325] Out: -   Labs:  Recent Labs  08/30/12 0530 08/31/12 0555 08/31/12 0830  WBC 3.4*  --  3.5*  HGB 11.1*  --  10.7*  PLT 103*  --  117*  CREATININE 0.69 0.68  --    Estimated Creatinine Clearance: 84.8 ml/min (by C-G formula based on Cr of 0.68).  Recent Labs  09/01/12 0151  VANCOTROUGH 15.1     Microbiology: Recent Results (from the past 720 hour(s))  CLOSTRIDIUM DIFFICILE BY PCR     Status: None   Collection Time    08/04/12  9:03 AM      Result Value Range Status   C difficile by pcr NEGATIVE  NEGATIVE Final  URINE CULTURE     Status: None   Collection Time    08/05/12  3:45 PM      Result Value Range Status   Specimen Description URINE, CATHETERIZED   Final   Special Requests Immunocompromised   Final   Culture  Setup Time 08/06/2012 01:21   Final   Colony Count >=100,000 COLONIES/ML   Final   Culture     Final   Value: ESCHERICHIA COLI     KLEBSIELLA PNEUMONIAE   Report Status 08/08/2012 FINAL   Final   Organism ID, Bacteria ESCHERICHIA COLI   Final   Organism ID, Bacteria KLEBSIELLA PNEUMONIAE   Final  CULTURE, BLOOD (ROUTINE X 2)     Status: None   Collection Time     08/05/12  4:00 PM      Result Value Range Status   Specimen Description BLOOD LEFT HAND   Final   Special Requests BOTTLES DRAWN AEROBIC AND ANAEROBIC 10CC   Final   Culture  Setup Time 08/05/2012 21:54   Final   Culture NO GROWTH 5 DAYS   Final   Report Status 08/11/2012 FINAL   Final  CULTURE, BLOOD (ROUTINE X 2)     Status: None   Collection Time    08/05/12  4:03 PM      Result Value Range Status   Specimen Description BLOOD LEFT ARM   Final   Special Requests BOTTLES DRAWN AEROBIC AND ANAEROBIC 10CC   Final   Culture  Setup Time 08/05/2012 21:54   Final   Culture NO GROWTH 5 DAYS   Final   Report Status 08/11/2012 FINAL   Final  MRSA PCR SCREENING     Status: None   Collection Time    08/07/12  3:14 PM      Result Value Range Status   MRSA by PCR NEGATIVE  NEGATIVE Final   Comment:  The GeneXpert MRSA Assay (FDA     approved for NASAL specimens     only), is one component of a     comprehensive MRSA colonization     surveillance program. It is not     intended to diagnose MRSA     infection nor to guide or     monitor treatment for     MRSA infections.  URINE CULTURE     Status: None   Collection Time    08/24/12 12:05 PM      Result Value Range Status   Specimen Description URINE, CLEAN CATCH   Final   Special Requests NONE   Final   Culture  Setup Time 08/24/2012 22:00   Final   Colony Count >=100,000 COLONIES/ML   Final   Culture KLEBSIELLA PNEUMONIAE   Final   Report Status 08/27/2012 FINAL   Final   Organism ID, Bacteria KLEBSIELLA PNEUMONIAE   Final  MRSA PCR SCREENING     Status: None   Collection Time    08/24/12  4:00 PM      Result Value Range Status   MRSA by PCR NEGATIVE  NEGATIVE Final   Comment:            The GeneXpert MRSA Assay (FDA     approved for NASAL specimens     only), is one component of a     comprehensive MRSA colonization     surveillance program. It is not     intended to diagnose MRSA     infection nor to guide or      monitor treatment for     MRSA infections.    Anti-infectives   Start     Dose/Rate Route Frequency Ordered Stop   08/29/12 1000  vancomycin (VANCOCIN) IVPB 750 mg/150 ml premix     750 mg 150 mL/hr over 60 Minutes Intravenous Every 12 hours 08/29/12 0608     08/25/12 2300  vancomycin (VANCOCIN) IVPB 1000 mg/200 mL premix  Status:  Discontinued     1,000 mg 200 mL/hr over 60 Minutes Intravenous Every 12 hours 08/25/12 1011 08/28/12 1301   08/25/12 1100  vancomycin (VANCOCIN) 2,000 mg in sodium chloride 0.9 % 500 mL IVPB     2,000 mg 250 mL/hr over 120 Minutes Intravenous  Once 08/25/12 1011 08/25/12 1355   08/24/12 1545  cefTRIAXone (ROCEPHIN) 1 g in dextrose 5 % 50 mL IVPB     1 g 100 mL/hr over 30 Minutes Intravenous Every 24 hours 08/24/12 1536 08/30/12 1526   08/24/12 1315  cefTRIAXone (ROCEPHIN) 1 g in dextrose 5 % 50 mL IVPB     1 g 100 mL/hr over 30 Minutes Intravenous  Once 08/24/12 1311 08/24/12 1537      Assessment: Patient with level just above goal.  Level drawn a little late as well.  Scheduled dose given just after level drawn. Feel that level of 15.1 with 750mg  iv q12hr is better than trying to reduce dose and possibly have trough below goal.  Trough of 15.1 is with PNA dosing goal.  I would rather be higher within PNA goal, vs too low for treatment  Goal of Therapy:  Vancomycin trough level 10-15 mcg/ml  Plan:  Measure antibiotic drug levels at steady state Follow up culture results Continue with current dose, just extend next dose time to 1800 4/23 Recheck level again soon. If level increases significantly would change dose.  Darlina Guys, Jacquenette Shone Crowford 09/01/2012,4:21 AM

## 2012-09-01 NOTE — Progress Notes (Signed)
    Subjective:  Feeling better. Notes some difficulty with getting up but she only gets out of bed with assistance. Once she is up she states she can walk pretty well. No chest pain, palpitations, or dyspnea at rest.  Objective:  Vital Signs in the last 24 hours: Temp:  [98.2 F (36.8 C)-98.9 F (37.2 C)] 98.3 F (36.8 C) (04/23 0400) Pulse Rate:  [43-130] 102 (04/23 0500) Resp:  [10-21] 16 (04/23 0500) BP: (80-114)/(44-86) 88/56 mmHg (04/23 0500) SpO2:  [97 %-99 %] 97 % (04/23 0500)  Intake/Output from previous day: 04/22 0701 - 04/23 0700 In: 2386 [I.V.:656; IV Piggyback:300] Out: 1730 [Urine:1730]  Physical Exam: Pt is alert and oriented, chronically ill-appearing woman in NAD HEENT: normal Neck: JVP - normal, carotids 2+= without bruits Lungs: Few rales noted bibasilar CV: Irregularly irregular without murmur or gallop Abd: soft, NT, Positive BS, no hepatomegaly Ext: Anasarca noted, distal pulses intact and equal  Lab Results:  Recent Labs  08/31/12 0830 09/01/12 0555  WBC 3.5* 3.6*  HGB 10.7* 10.0*  PLT 117* 118*    Recent Labs  08/30/12 0530 08/31/12 0555  NA 134* 135  K 3.3* 3.7  CL 98 99  CO2 32 34*  GLUCOSE 100* 121*  BUN 13 14  CREATININE 0.69 0.68   No results found for this basename: TROPONINI, CK, MB,  in the last 72 hours  Cardiac Studies: No new studies to review  Tele: Personally reviewed, atrial fibrillation heart rate 100-115 beats per minute  Assessment/Plan:  1. Atrial fibrillation with RVR. The patient has improved rate control, but not ideal. I am going to continue amiodarone and metoprolol. Will add diltiazem this morning. I think she is stable to move to a telemetry bed. Continue warfarin. Will consider cardioversion after 3 weeks of therapeutic anticoagulation. I think considering her heart rate control has improved, we will be able to defer inpatient transesophageal echo/cardioversion.  2. Acute on chronic diastolic heart  failure. Cardiac function is essentially normal by echo. I suspect her diffuse edema/anasarca is related to malnutrition and hypoalbuminemia. Reasonable to continue IV diuretics and name for negative fluid balance as long as she remains hemodynamically stable with adequate blood pressure.  We'll continue to follow with you. Will start oral Cardizem 30 mg every 6 hours today and if tolerated will convert to long-acting Cardizem CD tomorrow.    Tonny Bollman, M.D. 09/01/2012, 6:43 AM

## 2012-09-01 NOTE — Progress Notes (Signed)
[facility] PROGRESS NOTE  [name] ZOX:[id number] DOB: 05-20-1955 DOA: 08/24/2012 PCP: [name]., NP  Assessment/Plan: #1 A. fib with RVR Questionable etiology. May be secondary to stress and severe malnutrition and ongoing cancer. Cardiac enzymes are negative x3. Pro BNP is elevated. 2-D echo is pending.  -Patient was started on eliquis for anticoagulation per cardiology in anticipation of possible cardioversion, but eliquis was dc'ed and pt initially placed patient on full dose IV heparin in anticipation of possible PEG tube for her severe malnutrition, but due elevated INR, the heparin was dc'ed pending INR dropping to <2 then it will be resumed. -INR is1.89 on 4/18 following vit k, discussed with IR- she was given FFP to bring INR closer to 1.5  and PEG was placed per IR on 4/18  -continue amiodarone and metoprolol per cards, cardizem drip was dc'ed 4/18, dig was subsequently started but HR control was still poor so it was dc'ed 4/21 -antihypertensives have had held as appropriate secondary to hypotension, holding off IVF given her anasarca -Rate still poorly controlled last 4/20, started on Cardizem drip 4/21 but stopped overnight(4/21) due to hypotension.  -Oral cardizem to be started as BP this am 4/23 per Cardiology and plans for TEE/ cardioversion now deferred as her heart rate is better controlled overnight. -Patient still with hypotension-BP upper 80s initially and improved to 90s systolic this a.m., so we'll further monitor in the unit today especially given the oral Cardizem is being started this a.m., follow and if remains hemodynamically stable plan transfer to telemetry -Continue anticoagulation with heparin/Coumadin as per cardiology, INR is still subtherapeutic at 1.59 this a.m. -appreciate cardiology assistance   #2  Anarsarca/acute CHF exacerbation Likely secondary to severe malnutrition in the setting of afib with RVR likely leading to acute CHF  exacerbation with pulmonary edema. Patient's albumin is 1.6. Chest x-ray consistent with pulmonary edema. - continue current IV Lasix-Lasix has had to be held intermittently secondary to hypotension, follow.   -continue Strict I.'s and O.'s. Daily weights.  - 2-D echo with nl EF 55-60%, no wall motion abnormalities.  -Cardiology is following and appreciate assistance  #3 kleb urinary tract infection -kleb sensitive to Rocephin-day 7/7, d/c'ed 4/21  #4 left lower extremity cellulitis Improving,  Continue IV vancomycin day 8, may d/c after 10days if continues to improve.  #5 gastroesophageal reflux disease Continue PPI.  #6 type 2 diabetes Hemoglobin A1c 6.1. Continue sliding scale insulin.  #7 severe malnutrition/PEG placement Patient with 6-8 weeks of poor oral intake, decreased appetite. Megace started 4/15.  - s/p PEG tube placement 4/18 as above, continue tube feedings started 4/19 -patient tolerating well  -Discussed with patient's oncologist Dr. [name]- per Dr.Thompson  #8 history of breast cancer status post right mastectomy Pathologic staging PET 3, P. and 1, stage IIIa. ER and PR positive at 100%, HER -2/neu negative. Per oncology. -appreciate Dr Magrinat's input  #9 chronic liver disease -Continue diuretics. -contributing to likely underlying coagulopathy  #10 hypokalemia Secondary to diuretics.  -Repleted, continue to follow  #11 prophylaxis PPI for GI prophylaxis. Heparin for DVT prophylaxis.     Code Status: Full Family Communication: Updated patient and husband at bedside. Disposition Plan: Home when medically stable.   Consultants:  Cardiology: Dr. Graciela Husbands 08/24/2012  Oncology Dr. [name] 08/24/2012  Procedures:  2-D echo  08/26/2038  Chest x-ray 08/24/2012, 08/25/2012  Antibiotics:  IV Rocephin 08/24/12>>4/21  IV Vancomycin 08/25/12  HPI/Subjective:  She denies chest pain or shortness of breath, states feeling okay  this  a.m. Objective: Filed Vitals:   09/01/12 0300 09/01/12 0400 09/01/12 0500 09/01/12 0800  BP: 94/54 97/55 88/56    Pulse: 110 110 102   Temp:  98.3 F (36.8 C)  98 F (36.7 C)  TempSrc:  Oral  Oral  Resp: 18 18 16    Height:      Weight:      SpO2: 99% 97% 97%     Intake/Output Summary (Last 24 hours) at 09/01/12 0907 Last data filed at 09/01/12 0603  Gross per 24 hour  Intake   2245 ml  Output   1730 ml  Net    515 ml   Filed Weights   08/27/12 0400 08/27/12 0458 08/28/12 0500  Weight: 92.1 kg (203 lb 0.7 oz) 92.1 kg (203 lb 0.7 oz) 93.1 kg (205 lb 4 oz)    Exam:   General:  NAD. Alert and orientedx3  Cardiovascular: Irregularly irregular,mildy tachy  Respiratory:decreased BS at bases, no crackles and no wheezes  Abdomen: Soft/NT/ND/+BS  Extremities: No c/c. +2-3 BLE edema to hips. Left lower leg near the ankle with mild area of erythema.  Data Reviewed: Basic Metabolic Panel:  Recent Labs Lab 08/27/12 0355 08/27/12 2113 08/28/12 0525 08/29/12 0100 08/30/12 0530 08/31/12 0555  NA 133*  --  133* 135 134* 135  K 3.1* 2.9* 3.3* 3.8 3.3* 3.7  CL 97  --  98 100 98 99  CO2 31  --  30 29 32 34*  GLUCOSE 101*  --  79 96 100* 121*  BUN 14  --  14 14 13 14   CREATININE 0.83  --  0.76 0.81 0.69 0.68  CALCIUM 7.4*  --  7.5* 7.4* 7.3* 7.4*  MG  --  1.7  --  1.8  --   --    Liver Function Tests:  Recent Labs Lab 08/26/12 0500  AST 41*  ALT 39*  ALKPHOS 220*  BILITOT 0.5  PROT 5.2*  ALBUMIN 1.6*   No results found for this basename: LIPASE, AMYLASE,  in the last 168 hours No results found for this basename: AMMONIA,  in the last 168 hours CBC:  Recent Labs Lab 08/26/12 0500 08/27/12 0355 08/29/12 0100 08/30/12 0530 08/31/12 0830 09/01/12 0555  WBC 7.9 6.8 6.4 3.4* 3.5* 3.6*  NEUTROABS 5.9  --   --   --   --   --   HGB 12.1 11.4* 11.1* 11.1* 10.7* 10.0*  HCT 36.1 33.7* 34.2* 34.3* 32.8* 31.1*  MCV 80.9 81.4 84.0 84.5 85.0 85.4  PLT 120* 113* 96*  103* 117* 118*   Cardiac Enzymes: No results found for this basename: CKTOTAL, CKMB, CKMBINDEX, TROPONINI,  in the last 168 hours BNP (last 3 results)  Recent Labs  08/24/12 1225 08/25/12 0400 08/26/12 0500  PROBNP 1816.0* 1915.0* 1184.0*   CBG:  Recent Labs Lab 08/31/12 0753 08/31/12 1209 08/31/12 1623 08/31/12 2152 09/01/12 0729  GLUCAP 123* 129* 139* 112* 120*    Recent Results (from the past 240 hour(s))  URINE CULTURE     Status: None   Collection Time    08/24/12 12:05 PM      Result Value Range Status   Specimen Description URINE, CLEAN CATCH   Final   Special Requests NONE   Final   Culture  Setup Time 08/24/2012 22:00   Final   Colony Count >=100,000 COLONIES/ML   Final   Culture KLEBSIELLA PNEUMONIAE   Final   Report Status 08/27/2012 FINAL   Final  Organism ID, Bacteria KLEBSIELLA PNEUMONIAE   Final  MRSA PCR SCREENING     Status: None   Collection Time    08/24/12  4:00 PM      Result Value Range Status   MRSA by PCR NEGATIVE  NEGATIVE Final   Comment:            The GeneXpert MRSA Assay (FDA     approved for NASAL specimens     only), is one component of a     comprehensive MRSA colonization     surveillance program. It is not     intended to diagnose MRSA     infection nor to guide or     monitor treatment for     MRSA infections.     Studies: No results found.  Scheduled Meds: . amiodarone  400 mg Oral BID  . antiseptic oral rinse  15 mL Mouth Rinse BID  . antiseptic oral rinse  15 mL Mouth Rinse q12n4p  . atorvastatin  10 mg Oral QHS  . buPROPion  150 mg Oral q morning - 10a  . chlorhexidine  15 mL Mouth Rinse BID  . cholestyramine  4 g Oral BID  . diltiazem  30 mg Oral QID  . docusate  100 mg Per Tube BID  . feeding supplement  30 mL Per Tube BID  . feeding supplement  1 Container Oral BID BM  . furosemide  60 mg Intravenous Q12H  . insulin aspart  0-15 Units Subcutaneous TID WC  . letrozole  2.5 mg Oral Daily  . levothyroxine   125 mcg Oral QAC breakfast  . megestrol  400 mg Oral BID  . metoprolol tartrate  50 mg Per Tube Q8H  . multivitamin with minerals  1 tablet Oral Daily  . pantoprazole sodium  40 mg Per Tube Q0600  . saccharomyces boulardii  250 mg Oral BID  . sodium chloride  3 mL Intravenous Q12H  . sodium chloride  3 mL Intravenous Q12H  . vancomycin  750 mg Intravenous Q12H  . warfarin  4 mg Per Tube ONCE-1800  . Warfarin - Pharmacist Dosing Inpatient   Does not apply q1800   Continuous Infusions: . feeding supplement (JEVITY 1.2 CAL) 1,000 mL (09/01/12 0602)  . heparin 1,100 Units/hr (09/01/12 0300)    Principal Problem:   A-fib Active Problems:   HYPOTHYROIDISM   DIABETES MELLITUS, TYPE II   Morbid obesity   DEPRESSION   OBSTRUCTIVE SLEEP APNEA   GERD   DVT, HX OF   Liver disease, chronic, with cirrhosis   Cancer of breast   Hypokalemia   Obesity, unspecified   Severe malnutrition in the context of acute illness   Anasarca   UTI (lower urinary tract infection)   CHF, acute   Cellulitis of leg, left    Time spent: > 25 mins    Danilynn Jemison C  [facility] Pager [phone]. If 7PM-7AM, please contact night-coverage at [email or web address], password [facility] 09/01/2012, 9:07 AM  LOS: 8 days

## 2012-09-01 NOTE — Progress Notes (Signed)
ANTICOAGULATION CONSULT NOTE - Follow-up Consult  Pharmacy Consult for IV Heparin and Warfarin Indication: Afib with RVR  Allergies  Allergen Reactions  . Olmesartan Medoxomil Cough    REACTION: ? if cough  . Tetracycline Hcl     Unknown reaction, too long for patient to remember   . Venlafaxine     REACTION: severe dry moouth  . Adhesive (Tape) Rash   Patient Measurements: Height: 5\' 4"  (162.6 cm) Weight: 205 lb 4 oz (93.1 kg) IBW/kg (Calculated) : 54.7 Heparin Dosing Weight: 66kg  Vital Signs: Temp: 98.3 F (36.8 C) (04/23 0400) Temp src: Oral (04/23 0400) BP: 88/56 mmHg (04/23 0500) Pulse Rate: 102 (04/23 0500)  Labs:  Recent Labs  08/30/12 0530 08/30/12 0900 08/30/12 1800 08/31/12 0555 08/31/12 0830 09/01/12 0555  HGB 11.1*  --   --   --  10.7* 10.0*  HCT 34.3*  --   --   --  32.8* 31.1*  PLT 103*  --   --   --  117* 118*  LABPROT  --  20.1*  --  19.1*  --  18.5*  INR  --  1.78*  --  1.66*  --  1.59*  HEPARINUNFRC  --  0.34 0.31 0.44  --  0.33  CREATININE 0.69  --   --  0.68  --   --     Estimated Creatinine Clearance: 84.8 ml/min (by C-G formula based on Cr of 0.68).  Assessment:  45 yoF admitted with LE swelling, acute CHF exacerbation with anasarca.  PMHx significant for breast cancer s/p mastectomy, chemotherapy with no further cycles planned, newly diagnosed Afib, CHF, malnutrition, and chronic liver disease with cirrhosis.     Heparin level therapeutic today (0.33) on 1100 units/hr  INR 1.59 today, warfarin started 4/21 via tube; may demonstrate sensitivity due to chronic liver disease  Hbg stable, platelets have been low s/p chemo but improving  No bleeding reported per notes  Potential drug interaction with amiodarone noted, also on aspirin 81mg  daily  Appropriately spacing administration times of cholestyramine with warfarin to avoid binding  Consider transitioning heparin to lovenox if no interventions planned?  Goal: Heparin level  0.3-0.7 INR 2-3  Plan:   Continue Heparin at 1100 units/hr   Increase Warfarin 4mg  per tube today  Daily Heparin level, INR, CBC  Provide Warfarin education prior to discharge   Loralee Pacas, PharmD, BCPS Pager: [phone]  09/01/2012, 7:09 AM/

## 2012-09-01 NOTE — Progress Notes (Signed)
Physical Therapy Treatment Patient Details Name: [name] MRN: [id number] DOB: 10/27/1955 Today's Date: 09/01/2012 Time: 4098-1191 PT Time Calculation (min): 38 min  PT Assessment / Plan / Recommendation Comments on Treatment Session  Assisted pt OOB to [facility] for a BM then amb in hallway 2 short distances.  Pt c/o overall weakness but very willing to try.  Supine: BP 105/62(74), HR 114, 2lts sats 98%. EOB BP 118/82(91), HR 109, 2lts sats 97%.  Standing: BP 114/88 (94),HR 130, RA sats 90%.  Pt tolerated session well.    Follow Up Recommendations  Home health PT;Supervision/Assistance - 24 hour     Does the patient have the potential to tolerate intense rehabilitation     Barriers to Discharge        Equipment Recommendations  None recommended by PT    Recommendations for Other Services    Frequency Min 3X/week   Plan Discharge plan remains appropriate;Frequency remains appropriate    Precautions / Restrictions Precautions Precautions: Fall Precaution Comments: Monitor HR Restrictions Weight Bearing Restrictions: No   Pertinent Vitals/Pain No c/o pain MAX c/o weakness    Mobility  Bed Mobility Bed Mobility: Supine to Sit;Sitting - Scoot to Edge of Bed Supine to Sit: 3: Mod assist;2: Max assist Sitting - Scoot to [place] of Bed: 3: Mod assist;2: Max assist Details for Bed Mobility Assistance: increased time and HOB elevated also use of pad to swival hips around Transfers Transfers: Sit to Stand;Stand to Sit Sit to Stand: 1: +2 Total assist;From bed;With upper extremity assist;From toilet Sit to Stand: Patient Percentage: 70% Stand to Sit: 1: +2 Total assist Stand to Sit: Patient Percentage: 70% Stand Pivot Transfers: 1: +2 Total assist Stand Pivot Transfers: Patient Percentage: 70% Details for Transfer Assistance: verbal cues for hand placement. Assist to rise and stabilize and also to control descent to sit. +2 for lines and safety.  Assisted OOB to [facility] for  BM. Ambulation/Gait Ambulation/Gait Assistance: 1: +2 Total assist Ambulation/Gait: Patient Percentage: 60% Ambulation Distance (Feet): 45 Feet (25' then 44' with one sitting rest break) Assistive device: Rolling walker Ambulation/Gait Assistance Details: Pt required increased time do to overall weakness.  Amb on RA sats avg 89/90% with HR avg 111.  Used + 3 assist for safety/equipment/chair to follow. Gait Pattern: Step-to pattern;Wide base of support Gait velocity: slow.     PT Goals                                                       progressing    Visit Information  Last PT Received On: 09/01/12 Assistance Needed: +1    Subjective Data      Cognition    good   Balance   poor+  End of Session PT - End of Session Equipment Utilized During Treatment: Gait belt;Oxygen Activity Tolerance: Patient tolerated treatment well Patient left: in chair;with call bell/phone within reach;with nursing in room   Felecia Shelling  PTA [facility]  Acute  Rehab Pager      [phone]

## 2012-09-02 LAB — CBC
HCT: 31.2 % — ABNORMAL LOW (ref 36.0–46.0)
Hemoglobin: 10.2 g/dL — ABNORMAL LOW (ref 12.0–15.0)
MCH: 27.9 pg (ref 26.0–34.0)
MCV: 85.5 fL (ref 78.0–100.0)
RBC: 3.65 MIL/uL — ABNORMAL LOW (ref 3.87–5.11)
WBC: 4 10*3/uL (ref 4.0–10.5)

## 2012-09-02 LAB — BASIC METABOLIC PANEL
CO2: 35 mEq/L — ABNORMAL HIGH (ref 19–32)
Calcium: 7.5 mg/dL — ABNORMAL LOW (ref 8.4–10.5)
Chloride: 99 mEq/L (ref 96–112)
Creatinine, Ser: 0.58 mg/dL (ref 0.50–1.10)
Glucose, Bld: 106 mg/dL — ABNORMAL HIGH (ref 70–99)

## 2012-09-02 LAB — GLUCOSE, CAPILLARY: Glucose-Capillary: 143 mg/dL — ABNORMAL HIGH (ref 70–99)

## 2012-09-02 LAB — HEPARIN LEVEL (UNFRACTIONATED): Heparin Unfractionated: 0.29 IU/mL — ABNORMAL LOW (ref 0.30–0.70)

## 2012-09-02 MED ORDER — PRO-STAT SUGAR FREE PO LIQD
30.0000 mL | Freq: Three times a day (TID) | ORAL | Status: DC
  Administered 2012-09-02 – 2012-09-06 (×12): 30 mL
  Filled 2012-09-02 (×14): qty 30

## 2012-09-02 MED ORDER — DILTIAZEM HCL ER COATED BEADS 180 MG PO CP24
180.0000 mg | ORAL_CAPSULE | Freq: Every day | ORAL | Status: DC
  Administered 2012-09-02: 180 mg via ORAL
  Filled 2012-09-02 (×2): qty 1

## 2012-09-02 MED ORDER — WARFARIN SODIUM 4 MG PO TABS
4.0000 mg | ORAL_TABLET | Freq: Once | ORAL | Status: AC
  Administered 2012-09-02: 4 mg
  Filled 2012-09-02 (×2): qty 1

## 2012-09-02 MED ORDER — POTASSIUM CHLORIDE CRYS ER 20 MEQ PO TBCR
40.0000 meq | EXTENDED_RELEASE_TABLET | Freq: Once | ORAL | Status: AC
  Administered 2012-09-02: 40 meq via ORAL
  Filled 2012-09-02: qty 2

## 2012-09-02 MED ORDER — FREE WATER
240.0000 mL | Freq: Three times a day (TID) | Status: DC
  Administered 2012-09-02 – 2012-09-03 (×4): 240 mL

## 2012-09-02 MED ORDER — JEVITY 1.2 CAL PO LIQD
360.0000 mL | Freq: Four times a day (QID) | ORAL | Status: DC
  Administered 2012-09-02 – 2012-09-06 (×15): 360 mL
  Filled 2012-09-02 (×18): qty 474

## 2012-09-02 NOTE — Progress Notes (Signed)
Nutrition Brief Note  Plan to transition pt to bolus tube feeds to prepare pt for home tube feeds after discharge   Bolus TF Regimen : 1.5 cans Jevity 1.2, 4 times daily with 30 ml Prostat TID to provide 2010 kcal, 124 grams of protein, and 1146 ml of water.Recommend 50 ml water flushes before and after each bolus feed plus additional 240 ml water flushes QID in between bolus feeds.  Transition pt to bolus feeds slowly today. 2 PM Provide 0.5 can 4 PM Provide 1 can 7 PM provide 1.5 cans Tomorrow morning pt can start receiving 1.5 cans QID as instructed above.  Ian Malkin RD, LDN Inpatient Clinical Dietitian Pager: [phone] After Hours Pager: [phone]

## 2012-09-02 NOTE — Progress Notes (Signed)
CPAP equipment at bedside and connections appropriate per policy/protocol. Humidification chamber full of sterile water. Patient uses home nasal pillows. VSS at this time. She does prefer self placement, but also appreciative of assistance.

## 2012-09-02 NOTE — Progress Notes (Signed)
    Subjective:  Feels good. No CP, palps, or dyspnea.  Objective:  Vital Signs in the last 24 hours: Temp:  [97.9 F (36.6 C)-98.5 F (36.9 C)] 98.2 F (36.8 C) (04/24 0403) Pulse Rate:  [70-142] 105 (04/24 0600) Resp:  [11-27] 20 (04/24 0600) BP: (84-121)/(48-88) 96/62 mmHg (04/24 0600) SpO2:  [95 %-100 %] 99 % (04/24 0600)  Intake/Output from previous day: 04/23 0701 - 04/24 0700 In: 2632 [I.V.:682; IV Piggyback:300] Out: 2535 [Urine:2535]  Physical Exam: Pt is alert and oriented, NAD HEENT: normal Neck: JVP - normal Lungs: scattered rhonchi and rales CV: irregularly irregular Abd: soft, NT Ext: 1+ edema improved, distal pulses intact and equal Skin: warm/dry no rash   Lab Results:  Recent Labs  09/01/12 0555 09/02/12 0425  WBC 3.6* 4.0  HGB 10.0* 10.2*  PLT 118* 130*    Recent Labs  08/31/12 0555 09/02/12 0425  NA 135 137  K 3.7 3.5  CL 99 99  CO2 34* 35*  GLUCOSE 121* 106*  BUN 14 14  CREATININE 0.68 0.58   No results found for this basename: TROPONINI, CK, MB,  in the last 72 hours  Tele: Atrial fibrillation, heart rate 90s  Assessment/Plan:  1. Atrial fibrillation with RVR - heart rate improved, BP soft but acceptable. Will convert to long-acting diltiazem this am, continue metoprolol and amio. Continue heparin/warfarin. I think reasonable to transfer to tele bed today and continue PT/discharge planning.   2. Acute on chronic diastolic CHF - 10 L negative fluid balance. LV function normal. Continue same Rx - limited by BP.  Tonny Bollman, M.D. 09/02/2012, 6:52 AM

## 2012-09-02 NOTE — Clinical Social Work Psychosocial (Signed)
Clinical Social Work Department BRIEF PSYCHOSOCIAL ASSESSMENT 09/02/2012  Patient:  [name], [name]     Account Number:  [id number]     Admit date:  08/24/2012  Clinical Social Worker:  Jodelle Red  Date/Time:  09/02/2012 09:50 AM  Referred by:  CSW  Date Referred:  09/01/2012 Referred for  Other - See comment   Other Referral:   ADJUSTMENT TO ILLNESS   Interview type:  Patient Other interview type:   HUSBAND, [name]    PSYCHOSOCIAL DATA Living Status:  HUSBAND Admitted from facility:   Level of care:   Primary support name:  RONALD Creason Primary support relationship to patient:  SPOUSE Degree of support available:   GOOD FROM HUSBAND    CURRENT CONCERNS Current Concerns  Adjustment to Illness  Financial Resources   Other Concerns:    SOCIAL WORK ASSESSMENT / PLAN CSW met with Pt and her husband to check in and assess needs. Pt reported she has had "it rough" and had a very flat affect. Husband shared that "we are not going to a nursing home again." CSW provided supportive listening. Pt shared she feels discouraged about her condition.  Both Pt and husband plan for Pt to return home with [facility] services at this time. Pt could benefit from additional coping/supportive resources. Husbands main concern that he shared is financial concerns. CSW discussed ss disability and how to apply, as Pt could qualify. CSW shared info sheet and local resources.   Assessment/plan status:  Psychosocial Support/Ongoing Assessment of Needs Other assessment/ plan:   resources   Information/referral to community resources:   disability info sheet, [place] DSS info provided to husband    PATIENT'S/FAMILY'S RESPONSE TO PLAN OF CARE: Pt and husband were appreciative. Husband has info on how to apply for disability and plans to follow up with ss office. CSW can follow for support and resources. Current plan is to return home, but Pt is very weak. CSW to follow.    Doreen Salvage,  LCSW ICU/Stepdown Clinical Social Worker [facility] Cell [phone] Hours 8am-1200pm M-F

## 2012-09-02 NOTE — Progress Notes (Signed)
Physical Therapy Treatment Patient Details Name: [name] MRN: [id number] DOB: 02-28-1956 Today's Date: 09/02/2012 Time: 0923-1000 PT Time Calculation (min): 37 min  PT Assessment / Plan / Recommendation Comments on Treatment Session  Pt ambulated a short distance but was limited by extreme fatigue and weakness. She was on RA upon PT's arrival and stayed on RA thru out the session. O2 sats remained at 97% on avg, HR at 110 on avg, and BP at 104/65 on avg, with very little variation with activity vs rest.    Follow Up Recommendations  Home health PT;Supervision/Assistance - 24 hour     Equipment Recommendations  None recommended by PT    Frequency Min 3X/week   Plan Discharge plan remains appropriate;Frequency remains appropriate    Precautions / Restrictions Precautions Precautions: Fall Precaution Comments: Monitor HR Restrictions Weight Bearing Restrictions: No   Pertinent Vitals/Pain Pt denies specific pain.  HR 110; O2 97 on RA; BP 103/65  avg    Mobility  Transfers Transfers: Sit to Stand;Stand to Sit Sit to Stand: From chair/3-in-1;1: +2 Total assist Sit to Stand: Patient Percentage: 50% Stand to Sit: 1: +2 Total assist Stand to Sit: Patient Percentage: 70% Details for Transfer Assistance: verbal cues for hand placement. Assist to rise and stabilize and also to control descent to sit. +2 for lines and safety.   Ambulation/Gait Ambulation/Gait Assistance: 1: +2 Total assist Ambulation/Gait: Patient Percentage: 70% Ambulation Distance (Feet): 30 Feet Assistive device: Rolling walker Ambulation/Gait Assistance Details: Pt req'd increased time due to overall weakness and fatigue. Amb on RA sats avg 97% HR avg 110. Used +3 assist for safety/equipment/chair to follow. Gait Pattern: Step-to pattern;Wide base of support Gait velocity: slow.       PT Goals Acute Rehab PT Goals PT Goal Formulation: With patient/family Time For Goal Achievement:  09/09/12 Potential to Achieve Goals: Fair Pt will go Sit to Stand: with supervision PT Goal: Sit to Stand - Progress: Progressing toward goal Pt will go Stand to Sit: with supervision PT Goal: Stand to Sit - Progress: Progressing toward goal Pt will Ambulate: 51 - 150 feet;with min assist;with least restrictive assistive device PT Goal: Ambulate - Progress: Progressing toward goal  Visit Information  Last PT Received On: 09/02/12 Assistance Needed: +1    Subjective Data   I am just so tired.   Cognition    Good   Balance   Fair-  End of Session PT - End of Session Equipment Utilized During Treatment: Gait belt Activity Tolerance: Patient limited by fatigue Patient left: in chair;with call bell/phone within reach;with nursing in room   GP     BROWN-SMEDLEY, NICOLE 09/02/2012, 1:07 PM  Felecia Shelling  PTA [facility]  Acute  Rehab Pager      [phone]

## 2012-09-02 NOTE — Progress Notes (Signed)
[facility] PROGRESS NOTE  KAMARAH BILOTTA NGE:[id number] DOB: 11/28/1955 DOA: 08/24/2012 PCP: [name]., NP  Assessment/Plan: #1 A. fib with RVR Questionable etiology. May be secondary to stress and severe malnutrition and ongoing cancer. Cardiac enzymes are negative x3. Pro BNP is elevated. 2-D echo with nl EF.  -Patient was started on eliquis for anticoagulation per cardiology in anticipation of possible cardioversion, but eliquis was dc'ed and pt initially placed on full dose IV heparin in anticipation of possible PEG tube for her severe malnutrition, but due elevated INR, the heparin was dc'ed pending INR dropping to <2 then it was resumed. -INR is1.89 on 4/18 following vit k, Dr Suanne Marker discussed with IR- she was given FFP to bring INR closer to 1.5  and PEG was placed per IR on 4/18  -continue amiodarone and metoprolol per cards, cardizem drip was dc'ed 4/18, dig was subsequently started but HR control was still poor so it was dc'ed 4/21 -antihypertensives have had held as appropriate secondary to hypotension, holding off IVF given her anasarca -Rate still poorly controlled last 4/20, started on Cardizem drip 4/21 but stopped overnight(4/21) due to hypotension.  -Oral cardizem started as 4/23 per Cardiology and plans for TEE/ cardioversion now deferred as her heart rate is better controlled overnight. BP soft but per cardiology acceptable. Cardizem converted to long acting cardizem this morning 09/02/12 per cardiology. -Continue anticoagulation with heparin/Coumadin as per cardiology, INR is still subtherapeutic at 1.47 this a.m. -appreciate cardiology assistance   #2  Anarsarca/acute on chronic diastolic CHF exacerbation Likely secondary to severe malnutrition in the setting of afib with RVR likely leading to acute on chronic diastolic CHF exacerbation with pulmonary edema. Patient's albumin is 1.6. Chest x-ray was consistent with pulmonary edema. Patient neg 9.827L this  admission - continue current IV Lasix-Lasix has had to be held intermittently secondary to hypotension, follow.   -continue Strict I.'s and O.'s. Daily weights.  - 2-D echo with nl EF 55-60%, no wall motion abnormalities.  -Cardiology is following and appreciate assistance  #3 kleb urinary tract infection -kleb sensitive to Rocephin-day 7/7, d/c'ed 4/21  #4 left lower extremity cellulitis Improving clinically,  Continue IV vancomycin day 9/10.  #5 gastroesophageal reflux disease Continue PPI.  #6 type 2 diabetes Hemoglobin A1c 6.1. CBG 104-112. Continue sliding scale insulin.  #7 severe malnutrition/PEG placement Patient with 6-8 weeks of poor oral intake, decreased appetite. Megace started 4/15.  - s/p PEG tube placement 4/18 as above, continue tube feedings started 4/19 -patient tolerating well. 1-2 days prior to discharge will convert to bolus feeding to teach husband.  -Discussed with patient's oncologist Dr. [name].  #8 history of breast cancer status post right mastectomy Pathologic staging PET 3, P. and 1, stage IIIa. ER and PR positive at 100%, HER -2/neu negative. Per oncology. -appreciate Dr Magrinat's input  #9 chronic liver disease -Continue diuretics. -contributing to likely underlying coagulopathy  #10 hypokalemia Secondary to diuretics.  -Repleted, continue to follow  #11 prophylaxis PPI for GI prophylaxis. Heparin for DVT prophylaxis.     Code Status: Full Family Communication: Updated patient and husband at bedside. Disposition Plan: Transfer to telemetry. Home with [facility] services when medically stable.   Consultants:  Cardiology: Dr. Graciela Husbands 08/24/2012  Oncology Dr. [name] 08/24/2012  Procedures:  2-D echo  08/26/2038  Chest x-ray 08/24/2012, 08/25/2012  PEG 08/27/12  Antibiotics:  IV Rocephin 08/24/12>>4/21  IV Vancomycin 08/25/12  HPI/Subjective:  She denies chest pain or shortness of breath, states feeling better  everyday.  Objective: Filed Vitals:   09/02/12 0525 09/02/12 0600 09/02/12 0630 09/02/12 0700  BP: 98/63 96/62 104/52 103/65  Pulse: 93 105 50 92  Temp:      TempSrc:      Resp:  20 10 16   Height:      Weight:      SpO2:  99% 97% 95%    Intake/Output Summary (Last 24 hours) at 09/02/12 0757 Last data filed at 09/02/12 0700  Gross per 24 hour  Intake 2859.93 ml  Output   2635 ml  Net 224.93 ml   Filed Weights   08/27/12 0458 08/28/12 0500 09/02/12 0500  Weight: 92.1 kg (203 lb 0.7 oz) 93.1 kg (205 lb 4 oz) 92.9 kg (204 lb 12.9 oz)    Exam:   General:  NAD. Alert and orientedx3  Cardiovascular: Irregularly irregular,mildy tachy  Respiratory:decreased BS at bases, scattered rales  Abdomen: Soft/NT/ND/+BS. PEG tube in place  Extremities: No c/c. +2-3 BLE edema to hips. Left lower leg near the ankle with mild area of erythema improved.  Data Reviewed: Basic Metabolic Panel:  Recent Labs Lab 08/27/12 2113 08/28/12 0525 08/29/12 0100 08/30/12 0530 08/31/12 0555 09/02/12 0425  NA  --  133* 135 134* 135 137  K 2.9* 3.3* 3.8 3.3* 3.7 3.5  CL  --  98 100 98 99 99  CO2  --  30 29 32 34* 35*  GLUCOSE  --  79 96 100* 121* 106*  BUN  --  14 14 13 14 14   CREATININE  --  0.76 0.81 0.69 0.68 0.58  CALCIUM  --  7.5* 7.4* 7.3* 7.4* 7.5*  MG 1.7  --  1.8  --   --   --    Liver Function Tests: No results found for this basename: AST, ALT, ALKPHOS, BILITOT, PROT, ALBUMIN,  in the last 168 hours No results found for this basename: LIPASE, AMYLASE,  in the last 168 hours No results found for this basename: AMMONIA,  in the last 168 hours CBC:  Recent Labs Lab 08/29/12 0100 08/30/12 0530 08/31/12 0830 09/01/12 0555 09/02/12 0425  WBC 6.4 3.4* 3.5* 3.6* 4.0  HGB 11.1* 11.1* 10.7* 10.0* 10.2*  HCT 34.2* 34.3* 32.8* 31.1* 31.2*  MCV 84.0 84.5 85.0 85.4 85.5  PLT 96* 103* 117* 118* 130*   Cardiac Enzymes: No results found for this basename: CKTOTAL, CKMB, CKMBINDEX,  TROPONINI,  in the last 168 hours BNP (last 3 results)  Recent Labs  08/24/12 1225 08/25/12 0400 08/26/12 0500  PROBNP 1816.0* 1915.0* 1184.0*   CBG:  Recent Labs Lab 08/31/12 2152 09/01/12 0729 09/01/12 1125 09/01/12 1619 09/01/12 2203  GLUCAP 112* 120* 104* 109* 112*    Recent Results (from the past 240 hour(s))  URINE CULTURE     Status: None   Collection Time    08/24/12 12:05 PM      Result Value Range Status   Specimen Description URINE, CLEAN CATCH   Final   Special Requests NONE   Final   Culture  Setup Time 08/24/2012 22:00   Final   Colony Count >=100,000 COLONIES/ML   Final   Culture KLEBSIELLA PNEUMONIAE   Final   Report Status 08/27/2012 FINAL   Final   Organism ID, Bacteria KLEBSIELLA PNEUMONIAE   Final  MRSA PCR SCREENING     Status: None   Collection Time    08/24/12  4:00 PM      Result Value Range Status   MRSA by PCR NEGATIVE  NEGATIVE Final   Comment:            The GeneXpert MRSA Assay (FDA     approved for NASAL specimens     only), is one component of a     comprehensive MRSA colonization     surveillance program. It is not     intended to diagnose MRSA     infection nor to guide or     monitor treatment for     MRSA infections.     Studies: No results found.  Scheduled Meds: . amiodarone  400 mg Oral BID  . antiseptic oral rinse  15 mL Mouth Rinse BID  . antiseptic oral rinse  15 mL Mouth Rinse q12n4p  . atorvastatin  10 mg Oral QHS  . buPROPion  150 mg Oral q morning - 10a  . chlorhexidine  15 mL Mouth Rinse BID  . cholestyramine  4 g Oral BID  . diltiazem  180 mg Oral Daily  . docusate  100 mg Per Tube BID  . feeding supplement  30 mL Per Tube BID  . feeding supplement  1 Container Oral BID BM  . furosemide  60 mg Intravenous Q12H  . insulin aspart  0-15 Units Subcutaneous TID WC  . letrozole  2.5 mg Oral Daily  . levothyroxine  125 mcg Oral QAC breakfast  . megestrol  400 mg Oral BID  . metoprolol tartrate  50 mg Oral  Q8H  . multivitamin with minerals  1 tablet Oral Daily  . pantoprazole sodium  40 mg Per Tube Q0600  . potassium chloride  40 mEq Oral Once  . saccharomyces boulardii  250 mg Oral BID  . sodium chloride  10-40 mL Intracatheter Q12H  . sodium chloride  3 mL Intravenous Q12H  . sodium chloride  3 mL Intravenous Q12H  . vancomycin  750 mg Intravenous Q12H  . Warfarin - Pharmacist Dosing Inpatient   Does not apply q1800   Continuous Infusions: . feeding supplement (JEVITY 1.2 CAL) 1,000 mL (09/01/12 2150)  . heparin 1,300 Units/hr (09/02/12 1610)    Principal Problem:   A-fib Active Problems:   HYPOTHYROIDISM   DIABETES MELLITUS, TYPE II   Morbid obesity   DEPRESSION   OBSTRUCTIVE SLEEP APNEA   GERD   DVT, HX OF   Liver disease, chronic, with cirrhosis   Cancer of breast   Hypokalemia   Obesity, unspecified   Severe malnutrition in the context of acute illness   Anasarca   UTI (lower urinary tract infection)   CHF, acute   Cellulitis of leg, left    Time spent: > 35 mins    [facility]  [facility] Pager [phone]. If 7PM-7AM, please contact night-coverage at [email or web address], password [facility] 09/02/2012, 7:57 AM  LOS: 9 days

## 2012-09-02 NOTE — Care Management Note (Addendum)
    Page 1 of 2   09/06/2012     1:21:13 PM   CARE MANAGEMENT NOTE 09/06/2012  Patient:  MALEA, SWILLING   Account Number:  [id number]  Date Initiated:  08/25/2012  Documentation initiated by:  DAVIS,RHONDA  Subjective/Objective Assessment:   pt readmitted due to a.fib and requiring iv cardizem drip, hx of ovarian ca     Action/Plan:   snf   Anticipated DC Date:  09/06/2012   Anticipated DC Plan:  HOME W HOME HEALTH SERVICES  In-house referral  Clinical Social Worker      DC Planning Services  CM consult      [facility] Choice  NA   Choice offered to / List presented to:  C-1 Patient   DME arranged  BEDSIDE COMMODE  TUB BENCH      DME agency  [facility]     HH arranged  HH-1 RN  HH-2 PT  HH-3 OT  HH-4 NURSE'S AIDE  HH-6 SOCIAL WORKER      HH agency  [facility]   Status of service:  Completed, signed off Medicare Important Message given?  NA - LOS <3 / Initial given by admissions (If response is "NO", the following Medicare IM given date fields will be blank) Date Medicare IM given:   Date Additional Medicare IM given:    Discharge Disposition:  HOME W HOME HEALTH SERVICES  Per UR Regulation:  Reviewed for med. necessity/level of care/duration of stay  If discussed at Long Length of Stay Meetings, dates discussed:   09/02/2012    Comments:  09/06/12 Billijo Dilling RN,BSN NCM [phone] [facility] KRISTEN AWARE OF D/C HOME W/HHRN/PT/OT/AIDE,SW;HHRN-PT/INR ON WEDNESDAY TO CALL RESULTS TO [facility] DR. Clarisse Gouge DR. MAGRINAT.[facility] DME [place] PEG SUPPLIES,& JEVITY TO BE BROUGHT TO PATIENT'S HOME.INSTRUCTION HAS ALREADY BEEN GIVEN TO SPOUSE.HHRN [facility] WILL MAKE HOME VISIT TOMORROW.PATIENT/SPOUSE AWARE.SPOUSE WILL TRANSP HOME ON OWN.MD UPDATED.  09/03/12 Vona Whiters RN,BSN NCM [phone] [facility] KRISTEN ALREADY FOLLOWING AWARE OF HH ORDERS. 09/02/12 Anhelica Fowers RN,BSN NCM [phone] TRANSFERRED FROM SDU.FAMILY PLANS TO D/C HOME.CONCERNS FOR PEG BOLUS  FEEDING,& TEACHING SPOUSE.WILL FOLLOW FOR D/C PLANS.[facility] ACTIVE.  [id number] Earlene Plater, RN, BSN, CCM:  CHART REVIEWED AND UPDATED.  Next chart review due on [id number]. NO DISCHARGE NEEDS PRESENT AT THIS TIME.  patient wants to go home to see her dogs and grandchildren.  Is afraid that this will not be able to happen.  Possible cardiversion planned if increased iv meds do not help controll heart rate. CASE MANAGEMENT [phone]  [id number] Earlene Plater, RN, BSN, CCM:  CHART REVIEWED AND UPDATED.  Next chart review due on [id number]. NO DISCHARGE NEEDS PRESENT AT THIS TIME. CASE MANAGEMENT [phone]

## 2012-09-02 NOTE — Progress Notes (Signed)
PHARMACY BRIEF NOTE:  HEPARIN Indication:  Atrial Fibrillation  Heparin level  0.23 on 1100 units/hr  Hgb 10.2 Hct 31.2 Pltc 130K  Protime 17.4 INR 1.47  RN reports no interruptions in the infusion and no bleeding.  Assessment: -Heparin level subtherapeutic -Pltc improving  Plan: 1.  Increase heparin to 1300 units/hr. 2.  Recheck heparin level in 6 hours. 3.  Full note and warfarin orders to follow later today.  Elie Goody, PharmD, BCPS Pager: [phone] 09/02/2012  6:14 AM

## 2012-09-02 NOTE — Progress Notes (Addendum)
ANTICOAGULATION CONSULT NOTE - Follow-up Consult  Pharmacy Consult for IV Heparin and Warfarin Indication: Afib with RVR  Allergies  Allergen Reactions  . Olmesartan Medoxomil Cough    REACTION: ? if cough  . Tetracycline Hcl     Unknown reaction, too long for patient to remember   . Venlafaxine     REACTION: severe dry moouth  . Adhesive (Tape) Rash   Patient Measurements: Height: 5\' 4"  (162.6 cm) Weight: 198 lb 6.6 oz (90 kg) IBW/kg (Calculated) : 54.7 Heparin Dosing Weight: 66kg  Vital Signs: Temp: 98 F (36.7 C) (04/24 1030) Temp src: Oral (04/24 1030) BP: 93/64 mmHg (04/24 1030) Pulse Rate: 132 (04/24 0800)  Labs:  Recent Labs  08/31/12 0555  08/31/12 0830 09/01/12 0555 09/02/12 0425 09/02/12 1315  HGB  --   < > 10.7* 10.0* 10.2*  --   HCT  --   --  32.8* 31.1* 31.2*  --   PLT  --   --  117* 118* 130*  --   LABPROT 19.1*  --   --  18.5* 17.4*  --   INR 1.66*  --   --  1.59* 1.47  --   HEPARINUNFRC 0.44  --   --  0.33 0.23* 0.35  CREATININE 0.68  --   --   --  0.58  --   < > = values in this interval not displayed.  Estimated Creatinine Clearance: 83.3 ml/min (by C-G formula based on Cr of 0.58).  Assessment:  37 yoF admitted with LE swelling, acute CHF exacerbation with anasarca.  PMHx significant for breast cancer s/p mastectomy, chemotherapy with no further cycles planned, newly diagnosed Afib, CHF, malnutrition, and chronic liver disease with cirrhosis.     Warfarin started 4/21 via tube; may demonstrate sensitivity due to chronic liver disease  Hbg stable, platelets have been low s/p chemo but improving  No bleeding reported per notes  Potential drug interaction with amiodarone noted, also on aspirin 81mg  daily  Appropriately spacing administration times of cholestyramine with warfarin to avoid binding  Heparin level is therapeutic - will recheck in 6 hours to confirm dose  INR is subtherapeutic  Consider transitioning heparin to lovenox if no  interventions planned?  Goal: Heparin level 0.3-0.7 INR 2-3  Plan:   Continue Heparin at 1300 units/hr   Repeat Warfarin 4mg  per tube today  Heparin level in 6 hours to confirm dose  Daily Heparin level, INR, CBC  Provide Warfarin education prior to discharge   Darrol Angel, PharmD Pager: [phone] 09/02/2012, 1:58 PM/

## 2012-09-02 NOTE — Progress Notes (Signed)
ANTICOAGULATION CONSULT NOTE - Follow-up Consult  Pharmacy Consult for IV Heparin Indication: Afib with RVR  Labs:  Recent Labs  08/31/12 0555  08/31/12 0830 09/01/12 0555 09/02/12 0425 09/02/12 1315 09/02/12 2018  HGB  --   < > 10.7* 10.0* 10.2*  --   --   HCT  --   --  32.8* 31.1* 31.2*  --   --   PLT  --   --  117* 118* 130*  --   --   LABPROT 19.1*  --   --  18.5* 17.4*  --   --   INR 1.66*  --   --  1.59* 1.47  --   --   HEPARINUNFRC 0.44  --   --  0.33 0.23* 0.35 0.29*  CREATININE 0.68  --   --   --  0.58  --   --   < > = values in this interval not displayed.  Estimated Creatinine Clearance: 83.3 ml/min (by C-G formula based on Cr of 0.58).  Assessment:  58 yoF admitted with LE swelling, acute CHF exacerbation with anasarca.  PMHx significant for breast cancer s/p mastectomy, chemotherapy with no further cycles planned, newly diagnosed Afib, CHF, malnutrition, and chronic liver disease with cirrhosis.     Heparin level is just slightly below therapeutic range.   Goal: Heparin level 0.3-0.7  Plan:   Increase heparin infusion slightly to 1450 units/hr.   F/u AM heparin level in ~8hrs.   Daily Heparin level, INR, CBC.  Charolotte Eke, PharmD, pager [phone]. 09/02/2012,9:05 PM.

## 2012-09-03 DIAGNOSIS — E876 Hypokalemia: Secondary | ICD-10-CM

## 2012-09-03 LAB — BASIC METABOLIC PANEL
CO2: 36 mEq/L — ABNORMAL HIGH (ref 19–32)
Calcium: 7.5 mg/dL — ABNORMAL LOW (ref 8.4–10.5)
Creatinine, Ser: 0.6 mg/dL (ref 0.50–1.10)
GFR calc non Af Amer: >90 mL/min (ref >90)
Glucose, Bld: 88 mg/dL (ref 70–99)
Sodium: 133 mEq/L — ABNORMAL LOW (ref 135–145)

## 2012-09-03 LAB — HEPARIN LEVEL (UNFRACTIONATED)
Heparin Unfractionated: 0.4 IU/mL (ref 0.30–0.70)
Heparin Unfractionated: 0.52 IU/mL (ref 0.30–0.70)

## 2012-09-03 LAB — PROTIME-INR: Prothrombin Time: 19.7 seconds — ABNORMAL HIGH (ref 11.6–15.2)

## 2012-09-03 LAB — CBC
Hemoglobin: 10.1 g/dL — ABNORMAL LOW (ref 12.0–15.0)
MCH: 27.4 pg (ref 26.0–34.0)
MCHC: 32.2 g/dL (ref 30.0–36.0)
MCV: 85.3 fL (ref 78.0–100.0)
Platelets: 157 10*3/uL (ref 150–400)
RBC: 3.68 MIL/uL — ABNORMAL LOW (ref 3.87–5.11)

## 2012-09-03 LAB — GLUCOSE, CAPILLARY: Glucose-Capillary: 97 mg/dL (ref 70–99)

## 2012-09-03 MED ORDER — FREE WATER
160.0000 mL | Freq: Three times a day (TID) | Status: DC
  Administered 2012-09-03 – 2012-09-06 (×12): 160 mL

## 2012-09-03 MED ORDER — METOPROLOL TARTRATE 50 MG PO TABS
75.0000 mg | ORAL_TABLET | Freq: Two times a day (BID) | ORAL | Status: DC
  Administered 2012-09-03 – 2012-09-06 (×6): 75 mg via ORAL
  Filled 2012-09-03 (×7): qty 1

## 2012-09-03 MED ORDER — WARFARIN SODIUM 4 MG PO TABS
4.0000 mg | ORAL_TABLET | Freq: Once | ORAL | Status: AC
  Administered 2012-09-03: 4 mg
  Filled 2012-09-03: qty 1

## 2012-09-03 MED ORDER — POTASSIUM CHLORIDE CRYS ER 20 MEQ PO TBCR
40.0000 meq | EXTENDED_RELEASE_TABLET | Freq: Once | ORAL | Status: AC
  Administered 2012-09-03: 40 meq via ORAL
  Filled 2012-09-03: qty 2

## 2012-09-03 MED ORDER — FUROSEMIDE 80 MG PO TABS
80.0000 mg | ORAL_TABLET | Freq: Two times a day (BID) | ORAL | Status: DC
  Administered 2012-09-03 – 2012-09-06 (×7): 80 mg via ORAL
  Filled 2012-09-03 (×9): qty 1

## 2012-09-03 MED ORDER — DILTIAZEM HCL ER COATED BEADS 240 MG PO CP24
240.0000 mg | ORAL_CAPSULE | Freq: Every day | ORAL | Status: DC
  Administered 2012-09-03 – 2012-09-06 (×4): 240 mg via ORAL
  Filled 2012-09-03 (×4): qty 1

## 2012-09-03 NOTE — Progress Notes (Signed)
Pt had six beats of VTACH. Pt asymptomatic. Pt's BP was 96/62/ Pt's pulse was 63. MD notified. Will continue to monitor.

## 2012-09-03 NOTE — Progress Notes (Signed)
RN reports that pt had no residuals with first two bolus feeds today but, then had 200 ml residual at 4 PM prior to administering bolus feed; last bolus feed was at 12 PM (1.5 cans of Jevity 1.2). RN held 4 PM feed with plans to continue bolus feeds at 8 PM.   Recommend Reglan to increase gastric motility. Decrease free water flushes to 160 ml  Continue bolus tube feeds unless residuals >250 ml prior to feeds after 2 checks or one check of >500 ml. If residuals remain high, recommend switching to Osmolite 1.2 for trial (same TF regimen of 1.5 cans 4 times daily).  Ian Malkin RD, LDN Inpatient Clinical Dietitian Pager: [phone] After Hours Pager: [phone]

## 2012-09-03 NOTE — Progress Notes (Addendum)
ANTICOAGULATION CONSULT NOTE - Follow Up Consult  Pharmacy Consult for Heparin IV Indication: Afib with RVR  Allergies  Allergen Reactions  . Olmesartan Medoxomil Cough    REACTION: ? if cough  . Tetracycline Hcl     Unknown reaction, too long for patient to remember   . Venlafaxine     REACTION: severe dry moouth  . Adhesive (Tape) Rash    Patient Measurements: Height: 5\' 4"  (162.6 cm) Weight: 198 lb 10.2 oz (90.1 kg) IBW/kg (Calculated) : 54.7  Vital Signs: Temp: 97.9 F (36.6 C) (04/25 0427) Temp src: Oral (04/25 0427) BP: 111/64 mmHg (04/25 1101) Pulse Rate: 100 (04/25 1101)  Labs:  Recent Labs  09/01/12 0555 09/02/12 0425  09/02/12 2018 09/03/12 0400 09/03/12 1032  HGB 10.0* 10.2*  --   --  10.1*  --   HCT 31.1* 31.2*  --   --  31.4*  --   PLT 118* 130*  --   --  157  --   LABPROT 18.5* 17.4*  --   --  19.7*  --   INR 1.59* 1.47  --   --  1.73*  --   HEPARINUNFRC 0.33 0.23*  < > 0.29* 0.40 0.52  CREATININE  --  0.58  --   --  0.60  --   < > = values in this interval not displayed.  Estimated Creatinine Clearance: 83.4 ml/min (by C-G formula based on Cr of 0.6).   Assessment: 27 yoF admitted with LE swelling, acute CHF exacerbation with anasarca. PMHx significant for breast cancer s/p mastectomy, chemotherapy with no further cycles planned, CHF, malnutrition, and chronic liver disease with cirrhosis.  Pharmacy asked to dose IV heparin for newly diagnosed Afib.  Heparin level (0.52) remains therapeutic.  Heparin confirmed to be running at 14.5 ml/hr.  CBC:  Hgb is low/stable, Plt improved to wnl.  No bleeding reported in chart notes.  INR (1.73) remains subtherapeutic, but increasing.  Warfarin started 08/30/12.  Potential drug interaction with amiodarone noted, also on aspirin 81mg  daily.  Appropriately spacing administration times of cholestyramine with warfarin to avoid binding.  Goal of Therapy:  INR 2-3 Heparin level 0.3-0.7 units/ml Monitor  platelets by anticoagulation protocol: Yes   Plan:   Continue heparin IV infusion at 1450 units/hr  Daily heparin level, INR, and CBC  Warfarin 4mg  PO today at 1800  Warfarin education prior to discharge.  Consider transitioning heparin to lovenox if no interventions planned?  Lynann Beaver PharmD, BCPS Pager [phone] 09/03/2012 1:01 PM   Addendum: Warfarin education provided to patient and husband 09/03/12.  Lynann Beaver PharmD, BCPS Pager [phone] 09/03/2012 3:22 PM

## 2012-09-03 NOTE — Progress Notes (Signed)
[facility] PROGRESS NOTE  [name]:[id number] DOB: [birth year] DOA: 08/24/2012 PCP: [name]., NP  Assessment/Plan: #1 A. fib with RVR Questionable etiology. May be secondary to stress and severe malnutrition and ongoing cancer. Cardiac enzymes are negative x3. Pro BNP is elevated. 2-D echo with nl EF. HR fluctuating between 70s-120s. -Patient was started on eliquis for anticoagulation per cardiology in anticipation of possible cardioversion, but eliquis was dc'ed and pt initially placed on full dose IV heparin in anticipation of possible PEG tube for her severe malnutrition, but due elevated INR, the heparin was dc'ed pending INR dropping to <2 then it was resumed. -INR is1.89 on 4/18 following vit k, Dr Suanne Marker discussed with IR- she was given FFP to bring INR closer to 1.5  and PEG was placed per IR on 4/18  -continue amiodarone and metoprolol per cards, cardizem drip was dc'ed 4/18, dig was subsequently started but HR control was still poor so it was dc'ed 4/21 -antihypertensives have had held as appropriate secondary to hypotension, holding off IVF given her anasarca -Rate still poorly controlled last 4/20, started on Cardizem drip 4/21 but stopped overnight(4/21) due to hypotension.  -Oral cardizem started as 4/23 per Cardiology and plans for TEE/ cardioversion now deferred as her heart rate is better controlled overnight. BP soft but per cardiology acceptable. Cardizem converted to long acting cardizem and increased per cardiology. Metoprolol changed to 75mg  BID. -Continue anticoagulation with heparin/Coumadin as per cardiology, INR is still subtherapeutic at 1.73 this a.m. -appreciate cardiology assistance   #2  Anarsarca/acute on chronic diastolic CHF exacerbation Likely secondary to severe malnutrition in the setting of afib with RVR likely leading to acute on chronic diastolic CHF exacerbation with pulmonary edema. Patient's albumin is 1.6. Chest x-ray was  consistent with pulmonary edema. Patient neg 10.948L this admission -  IV Lasix-Lasix has had to be held intermittently secondary to hypotension, follow.   -continue Strict I.'s and O.'s. Daily weights. IV lasix changed to oral per cardiology. - 2-D echo with nl EF 55-60%, no wall motion abnormalities.  -Cardiology is following and appreciate assistance  #3 kleb urinary tract infection -kleb sensitive to Rocephin-day 7/7, d/c'ed 4/21  #4 left lower extremity cellulitis Improving clinically,  Continue IV vancomycin day 10/10. D/c IV vancomycin after todays dose.  #5 gastroesophageal reflux disease Continue PPI.  #6 type 2 diabetes Hemoglobin A1c 6.1. CBG 86-137. Continue sliding scale insulin.  #7 severe malnutrition/PEG placement Patient with 6-8 weeks of poor oral intake, decreased appetite. Megace started 4/15.  - s/p PEG tube placement 4/18 as above, continue tube feedings started 4/19 -patient tolerating well. Started on bolus feeding to teach husband.  -Discussed with patient's oncologist Dr. [name].  #8 history of breast cancer status post right mastectomy Pathologic staging PET 3, P. and 1, stage IIIa. ER and PR positive at 100%, HER -2/neu negative. Per oncology. -appreciate Dr Magrinat's input  #9 chronic liver disease -Continue diuretics. -contributing to likely underlying coagulopathy  #10 hypokalemia Secondary to diuretics.  -Repleted, continue to follow  #11 prophylaxis PPI for GI prophylaxis. Heparin for DVT prophylaxis.     Code Status: Full Family Communication: Updated patient and husband at bedside. Disposition Plan: Transfer to telemetry. Home with [facility] services when medically stable.   Consultants:  Cardiology: Dr. Graciela Husbands 08/24/2012  Oncology Dr. [name] 08/24/2012  Procedures:  2-D echo  08/26/2038  Chest x-ray 08/24/2012, 08/25/2012  PEG 08/27/12  Antibiotics:  IV Rocephin 08/24/12>>4/21  IV Vancomycin 08/25/12 >>  09/04/14  HPI/Subjective:  She denies chest pain or shortness of breath, states feeling better everyday. Objective: Filed Vitals:   09/03/12 0835 09/03/12 1017 09/03/12 1101 09/03/12 1418  BP: 107/58 110/69 111/64 113/77  Pulse: 125 76 100 123  Temp:    97.8 F (36.6 C)  TempSrc:    Oral  Resp:    18  Height:      Weight:      SpO2:    97%    Intake/Output Summary (Last 24 hours) at 09/03/12 1649 Last data filed at 09/03/12 1300  Gross per 24 hour  Intake    185 ml  Output   2800 ml  Net  -2615 ml   Filed Weights   09/02/12 0500 09/02/12 1030 09/03/12 0427  Weight: 92.9 kg (204 lb 12.9 oz) 90 kg (198 lb 6.6 oz) 90.1 kg (198 lb 10.2 oz)    Exam:   General:  NAD. Alert and orientedx3  Cardiovascular: Irregularly irregular,mildy tachy  Respiratory:decreased BS at bases, scattered rales  Abdomen: Soft/NT/ND/+BS. PEG tube in place  Extremities: No c/c. +2-3 BLE edema to hips. Left lower leg near the ankle with mild area of erythema improved.  Data Reviewed: Basic Metabolic Panel:  Recent Labs Lab 08/27/12 2113  08/29/12 0100 08/30/12 0530 08/31/12 0555 09/02/12 0425 09/03/12 0400  NA  --   < > 135 134* 135 137 133*  K 2.9*  < > 3.8 3.3* 3.7 3.5 3.3*  CL  --   < > 100 98 99 99 96  CO2  --   < > 29 32 34* 35* 36*  GLUCOSE  --   < > 96 100* 121* 106* 88  BUN  --   < > 14 13 14 14 16   CREATININE  --   < > 0.81 0.69 0.68 0.58 0.60  CALCIUM  --   < > 7.4* 7.3* 7.4* 7.5* 7.5*  MG 1.7  --  1.8  --   --   --  1.8  < > = values in this interval not displayed. Liver Function Tests: No results found for this basename: AST, ALT, ALKPHOS, BILITOT, PROT, ALBUMIN,  in the last 168 hours No results found for this basename: LIPASE, AMYLASE,  in the last 168 hours No results found for this basename: AMMONIA,  in the last 168 hours CBC:  Recent Labs Lab 08/30/12 0530 08/31/12 0830 09/01/12 0555 09/02/12 0425 09/03/12 0400  WBC 3.4* 3.5* 3.6* 4.0 5.6  HGB 11.1*  10.7* 10.0* 10.2* 10.1*  HCT 34.3* 32.8* 31.1* 31.2* 31.4*  MCV 84.5 85.0 85.4 85.5 85.3  PLT 103* 117* 118* 130* 157   Cardiac Enzymes: No results found for this basename: CKTOTAL, CKMB, CKMBINDEX, TROPONINI,  in the last 168 hours BNP (last 3 results)  Recent Labs  08/24/12 1225 08/25/12 0400 08/26/12 0500  PROBNP 1816.0* 1915.0* 1184.0*   CBG:  Recent Labs Lab 09/02/12 1149 09/02/12 1749 09/02/12 2057 09/03/12 0719 09/03/12 1213  GLUCAP 124* 143* 123* 86 108*    No results found for this or any previous visit (from the past 240 hour(s)).   Studies: No results found.  Scheduled Meds: . amiodarone  400 mg Oral BID  . antiseptic oral rinse  15 mL Mouth Rinse BID  . antiseptic oral rinse  15 mL Mouth Rinse q12n4p  . atorvastatin  10 mg Oral QHS  . buPROPion  150 mg Oral q morning - 10a  . chlorhexidine  15 mL Mouth Rinse BID  .  cholestyramine  4 g Oral BID  . diltiazem  240 mg Oral Daily  . docusate  100 mg Per Tube BID  . feeding supplement (JEVITY 1.2 CAL)  360 mL Per Tube QID  . feeding supplement  30 mL Per Tube TID WC  . feeding supplement  1 Container Oral BID BM  . free water  240 mL Per Tube TID PC & HS  . furosemide  80 mg Oral BID  . insulin aspart  0-15 Units Subcutaneous TID WC  . letrozole  2.5 mg Oral Daily  . levothyroxine  125 mcg Oral QAC breakfast  . megestrol  400 mg Oral BID  . metoprolol tartrate  75 mg Oral BID  . multivitamin with minerals  1 tablet Oral Daily  . pantoprazole sodium  40 mg Per Tube Q0600  . saccharomyces boulardii  250 mg Oral BID  . sodium chloride  10-40 mL Intracatheter Q12H  . sodium chloride  3 mL Intravenous Q12H  . sodium chloride  3 mL Intravenous Q12H  . vancomycin  750 mg Intravenous Q12H  . warfarin  4 mg Per Tube ONCE-1800  . Warfarin - Pharmacist Dosing Inpatient   Does not apply q1800   Continuous Infusions: . heparin 1,450 Units/hr (09/03/12 1328)    Principal Problem:   A-fib Active Problems:    HYPOTHYROIDISM   DIABETES MELLITUS, TYPE II   Morbid obesity   DEPRESSION   OBSTRUCTIVE SLEEP APNEA   GERD   DVT, HX OF   Liver disease, chronic, with cirrhosis   Cancer of breast   Hypokalemia   Obesity, unspecified   Severe malnutrition in the context of acute illness   Anasarca   UTI (lower urinary tract infection)   CHF, acute   Cellulitis of leg, left    Time spent: > 35 mins    [facility]  [facility] Pager [phone]. If 7PM-7AM, please contact night-coverage at [email or web address], password [email or web address] 09/03/2012, 4:49 PM  LOS: 10 days

## 2012-09-03 NOTE — Progress Notes (Signed)
Started tube feeding education with patient/husband for home care, husband demonstrated while patient observed, will continue with education and reported off to night shift RN to F/U as well.

## 2012-09-03 NOTE — Progress Notes (Signed)
Pt had 5 beats of V. Tach on heart monitor.  Pt has had this before and was previously noted. VSS, Pt asymptomatic. Will continue to monitor. [place] N[place]

## 2012-09-03 NOTE — Progress Notes (Signed)
ANTICOAGULATION CONSULT NOTE - Follow-up Consult  Pharmacy Consult for IV Heparin Indication: Afib with RVR  Labs:  Recent Labs  09/01/12 0555 09/02/12 0425 09/02/12 1315 09/02/12 2018 09/03/12 0400  HGB 10.0* 10.2*  --   --  10.1*  HCT 31.1* 31.2*  --   --  31.4*  PLT 118* 130*  --   --  157  LABPROT 18.5* 17.4*  --   --  19.7*  INR 1.59* 1.47  --   --  1.73*  HEPARINUNFRC 0.33 0.23* 0.35 0.29* 0.40  CREATININE  --  0.58  --   --   --     Estimated Creatinine Clearance: 83.4 ml/min (by C-G formula based on Cr of 0.58).  Assessment:  5 yoF admitted with LE swelling, acute CHF exacerbation with anasarca.  PMHx significant for breast cancer s/p mastectomy, chemotherapy with no further cycles planned, newly diagnosed Afib, CHF, malnutrition, and chronic liver disease with cirrhosis.     Heparin level is at goal, no problems reported.  Goal: Heparin level 0.3-0.7  Plan:   Continue heparin drip @ 1450 units/hr.   Recheck @ 11am today  Daily Heparin level, INR, CBC.  [name] 09/03/2012 6:18 AM

## 2012-09-03 NOTE — Progress Notes (Signed)
Occupational Therapy Treatment Patient Details Name: [name] MRN: [id number] DOB: January 26, 1956 Today's Date: 09/03/2012 Time: 8295-6213 OT Time Calculation (min): 19 min  OT Assessment / Plan / Recommendation Comments on Treatment Session  Pt fatigued after ambulating with PT, but performed Bil. UE exercises.  Pt and husband instructed for her to perform these daily.    Follow Up Recommendations  Supervision/Assistance - 24 hour;Home health OT    Barriers to Discharge       Equipment Recommendations  3 in 1 bedside comode    Recommendations for Other Services    Frequency Min 2X/week   Plan Discharge plan remains appropriate    Precautions / Restrictions Precautions Precautions: Fall Precaution Comments: Monitor HR Restrictions Weight Bearing Restrictions: No   Pertinent Vitals/Pain     ADL  ADL Comments: Pt instructed to perform UE exercises daily as often as she could tolerate    OT Diagnosis:    OT Problem List:   OT Treatment Interventions:     OT Goals ADL Goals ADL Goal: Additional Goal #1 - Progress: Progressing toward goals  Visit Information  Last OT Received On: 09/03/12 Assistance Needed: +2    Subjective Data      Prior Functioning       Cognition  Cognition Arousal/Alertness: Awake/alert Behavior During Therapy: WFL for tasks assessed/performed Overall Cognitive Status: Within Functional Limits for tasks assessed    Mobility  Bed Mobility Bed Mobility: Supine to Sit;Sitting - Scoot to Edge of Bed Supine to Sit: 3: Mod assist;HOB elevated Sitting - Scoot to [place] of Bed: 2: Max assist Details for Bed Mobility Assistance: increased time and HOB elevated, assist for trunk upright and scooting L hip forward with bed pad Transfers Sit to Stand: 1: +2 Total assist;From bed;With upper extremity assist Sit to Stand: Patient Percentage: 70% Stand to Sit: 1: +2 Total assist Stand to Sit: Patient Percentage: 70% Details for Transfer  Assistance: verbal cues for hand placement. Assist to rise and stabilize and also to control descent to sit. +2 for lines and safety.      Exercises  General Exercises - Upper Extremity Shoulder Flexion: AROM;Both;15 reps;Seated (2 sets) Shoulder ABduction: AROM;Right;Left;15 reps;Seated (2 sets) Other Exercises Other Exercises: arm circles with shoulders abducted 2 sets 15 reps   Balance     End of Session OT - End of Session Activity Tolerance: Patient tolerated treatment well Patient left: in chair;with call bell/phone within reach;with family/visitor present  GO     Ion Gonnella, Ursula Alert M 09/03/2012, 5:53 PM

## 2012-09-03 NOTE — Progress Notes (Signed)
Physical Therapy Treatment Patient Details Name: [name] MRN: [id number] DOB: 1956/02/22 Today's Date: 09/03/2012 Time: 8295-6213 PT Time Calculation (min): 33 min  PT Assessment / Plan / Recommendation Comments on Treatment Session  Pt able to tolerate increased ambulation distance this visit however required seated rest break and monitoring HR 105-147.    Follow Up Recommendations  Home health PT;Supervision/Assistance - 24 hour     Does the patient have the potential to tolerate intense rehabilitation     Barriers to Discharge        Equipment Recommendations  None recommended by PT    Recommendations for Other Services    Frequency     Plan Discharge plan remains appropriate;Frequency remains appropriate    Precautions / Restrictions Precautions Precautions: Fall Precaution Comments: Monitor HR   Pertinent Vitals/Pain See below    Mobility  Bed Mobility Bed Mobility: Supine to Sit;Sitting - Scoot to Edge of Bed Supine to Sit: 3: Mod assist;HOB elevated Sitting - Scoot to [place] of Bed: 2: Max assist Details for Bed Mobility Assistance: increased time and HOB elevated, assist for trunk upright and scooting L hip forward with bed pad Transfers Transfers: Sit to Stand;Stand to Sit Sit to Stand: 1: +2 Total assist;From bed;With upper extremity assist Sit to Stand: Patient Percentage: 70% Stand to Sit: 1: +2 Total assist Stand to Sit: Patient Percentage: 70% Details for Transfer Assistance: verbal cues for hand placement. Assist to rise and stabilize and also to control descent to sit. +2 for lines and safety.   Ambulation/Gait Ambulation/Gait Assistance: 1: +2 Total assist Ambulation/Gait: Patient Percentage: 80% Ambulation Distance (Feet): 40 Feet (x2) Assistive device: Rolling walker Ambulation/Gait Assistance Details: verbal cues for RW use and posture, pt reports fatigue, required seated rest break, HR between 105-147, RN reports variable HR all day. Gait  Pattern: Step-to pattern;Wide base of support Gait velocity: decreased    Exercises     PT Diagnosis:    PT Problem List:   PT Treatment Interventions:     PT Goals Acute Rehab PT Goals PT Goal: Supine/Side to Sit - Progress: Progressing toward goal PT Goal: Sit to Stand - Progress: Progressing toward goal PT Goal: Stand to Sit - Progress: Progressing toward goal PT Goal: Ambulate - Progress: Progressing toward goal  Visit Information  Last PT Received On: 09/03/12 Assistance Needed: +2 (pt prefers, chair following)    Subjective Data  Subjective: Can you make this CD player work?     Cognition  Cognition Arousal/Alertness: Awake/alert Behavior During Therapy: WFL for tasks assessed/performed Overall Cognitive Status: Within Functional Limits for tasks assessed    Balance     End of Session PT - End of Session Equipment Utilized During Treatment: Gait belt Activity Tolerance: Patient limited by fatigue Patient left: in chair;with call bell/phone within reach;with nursing in room   GP     Rebekah Sprinkle,KATHrine E 09/03/2012, 2:23 PM Zenovia Jarred, PT, DPT 09/03/2012 Pager: [phone]

## 2012-09-03 NOTE — Progress Notes (Signed)
    Subjective:  No chest pain, dyspnea, or palps  Objective:  Vital Signs in the last 24 hours: Temp:  [97.7 F (36.5 C)-98 F (36.7 C)] 97.9 F (36.6 C) (04/25 0427) Pulse Rate:  [68-132] 68 (04/25 0427) Resp:  [16-20] 20 (04/24 1510) BP: (93-108)/(60-68) 98/68 mmHg (04/25 0427) SpO2:  [95 %-98 %] 96 % (04/25 0427) Weight:  [90 kg (198 lb 6.6 oz)-90.1 kg (198 lb 10.2 oz)] 90.1 kg (198 lb 10.2 oz) (04/25 0427)  Intake/Output from previous day: 04/24 0701 - 04/25 0700 In: 1529 [P.O.:960; I.V.:109] Out: 2650 [Urine:2650]  Physical Exam: Pt is alert and oriented, chronically ill-appearing woman in NAD HEENT: normal Neck: JVP - normal, carotids 2+= without bruits Lungs: CTA bilaterally CV: irregularly irregular without murmur or gallop Abd: soft, NT, Positive BS Ext: diffuse edema, distal pulses intact and equal Skin: warm/dry no rash  Lab Results:  Recent Labs  09/02/12 0425 09/03/12 0400  WBC 4.0 5.6  HGB 10.2* 10.1*  PLT 130* 157    Recent Labs  09/02/12 0425 09/03/12 0400  NA 137 133*  K 3.5 3.3*  CL 99 96  CO2 35* 36*  GLUCOSE 106* 88  BUN 14 16  CREATININE 0.58 0.60   No results found for this basename: TROPONINI, CK, MB,  in the last 72 hours  Tele: AFib - heart rate 80-110 bpm  Assessment/Plan:  1. AF with RVR - pt stable on current Rx. Continue heparin until INR > 2.0. Recommend the following with her meds:  Continue amiodarone 400 mg BID x 7 more days, then 200 mg daily  Increase cardizem CD to 240 mg daily  Consolidate metoprolol to 75 mg BID  2. Acute diastolic CHF. Major reason for edema is severe protein-calorie malnutrition/hypoalbuminemia. Will change lasix to 80 mg BID. No pulmonary congestion. LVEF normal.  Will arrange follow-up with Dr Elease Hashimoto or his PA in the next 1-2 weeks. Depending on her clinical course as an outpatient, will consider elective DCCV after 3 weeks therapeutic anticoagulation.  Tonny Bollman, M.D. 09/03/2012,  6:50 AM

## 2012-09-03 NOTE — Progress Notes (Signed)
Patient continue to have some beats of V-tach/PVCs but asymptomatic Dr. Dayle Points notified no new order given stated he is ware and it is because of her A-fib and medication adjustments was made. Patient denies any pain/distress., will continue to monitor patient.

## 2012-09-03 NOTE — Progress Notes (Signed)
1600 tube feed (Jervity 1.2) not given due to residual >250, feeding held, dietitian notified and she reccommended some adjustments. Patient denies any N/V, pain or distress.  Patient tolerated 8AM tube feeding well, no residual when 1200pm feeding was given. Will continue to assess patient.

## 2012-09-03 NOTE — Plan of Care (Signed)
Problem: Phase II Progression Outcomes Goal: Tolerating diet Outcome: Progressing Refusing PO meal intake but on bolus tube feeding.

## 2012-09-04 DIAGNOSIS — I4891 Unspecified atrial fibrillation: Principal | ICD-10-CM

## 2012-09-04 LAB — HEPARIN LEVEL (UNFRACTIONATED): Heparin Unfractionated: 0.41 IU/mL (ref 0.30–0.70)

## 2012-09-04 LAB — BASIC METABOLIC PANEL
GFR calc non Af Amer: >90 mL/min (ref >90)
Glucose, Bld: 95 mg/dL (ref 70–99)
Potassium: 3.5 mEq/L (ref 3.5–5.1)
Sodium: 133 mEq/L — ABNORMAL LOW (ref 135–145)

## 2012-09-04 LAB — GLUCOSE, CAPILLARY
Glucose-Capillary: 96 mg/dL (ref 70–99)
Glucose-Capillary: 121 mg/dL — ABNORMAL HIGH (ref 70–99)
Glucose-Capillary: 130 mg/dL — ABNORMAL HIGH (ref 70–99)
Glucose-Capillary: 137 mg/dL — ABNORMAL HIGH (ref 70–99)

## 2012-09-04 LAB — CBC
Hemoglobin: 10.4 g/dL — ABNORMAL LOW (ref 12.0–15.0)
MCHC: 32.6 g/dL (ref 30.0–36.0)
Platelets: 217 10*3/uL (ref 150–400)
RBC: 3.73 MIL/uL — ABNORMAL LOW (ref 3.87–5.11)

## 2012-09-04 LAB — PROTIME-INR
INR: 1.92 — ABNORMAL HIGH (ref 0.00–1.49)
Prothrombin Time: 21.2 seconds — ABNORMAL HIGH (ref 11.6–15.2)

## 2012-09-04 MED ORDER — WARFARIN SODIUM 4 MG PO TABS
4.0000 mg | ORAL_TABLET | Freq: Once | ORAL | Status: AC
  Administered 2012-09-04: 4 mg via ORAL
  Filled 2012-09-04: qty 1

## 2012-09-04 MED ORDER — METOCLOPRAMIDE HCL 5 MG PO TABS
5.0000 mg | ORAL_TABLET | Freq: Three times a day (TID) | ORAL | Status: DC
  Filled 2012-09-04 (×4): qty 1

## 2012-09-04 MED ORDER — POTASSIUM CHLORIDE CRYS ER 20 MEQ PO TBCR
40.0000 meq | EXTENDED_RELEASE_TABLET | Freq: Every day | ORAL | Status: DC
  Administered 2012-09-04 – 2012-09-05 (×2): 40 meq via ORAL
  Filled 2012-09-04 (×3): qty 2

## 2012-09-04 NOTE — Progress Notes (Signed)
Patient ID: [name], female   DOB: 1955-06-29, 57 y.o.   MRN: [id number]   Please see the complete cardiology note by Dr. Excell Seltzer September 03, 2012.  Jerral Bonito, MD

## 2012-09-04 NOTE — Progress Notes (Signed)
[facility] PROGRESS NOTE  [name] ZOX:[id number] DOB: 1955/08/09 DOA: 08/24/2012 PCP: [name]., NP  Assessment/Plan: #1 A. fib with RVR Questionable etiology. May be secondary to stress and severe malnutrition and ongoing cancer. Cardiac enzymes are negative x3. Pro BNP is elevated. 2-D echo with nl EF. HR fluctuating between 70s-130s. -Patient was started on eliquis for anticoagulation per cardiology in anticipation of possible cardioversion, but eliquis was dc'ed and pt initially placed on full dose IV heparin in anticipation of possible PEG tube for her severe malnutrition, but due elevated INR, the heparin was dc'ed pending INR dropping to <2 then it was resumed. -INR is1.89 on 4/18 following vit k, Dr Suanne Marker discussed with IR- she was given FFP to bring INR closer to 1.5  and PEG was placed per IR on 4/18  -continue amiodarone and metoprolol per cards, cardizem drip was dc'ed 4/18, dig was subsequently started but HR control was still poor so it was dc'ed 4/21 -antihypertensives have had held as appropriate secondary to hypotension, holding off IVF given her anasarca -Rate still poorly controlled last 4/20, started on Cardizem drip 4/21 but stopped overnight(4/21) due to hypotension.  -Oral cardizem started as 4/23 per Cardiology and plans for TEE/ cardioversion now deferred as her heart rate is better controlled overnight. HR 70s - 130s. BP soft but per cardiology acceptable. Cardizem converted to long acting cardizem and increased per cardiology. Metoprolol changed to 75mg  BID. Continue amiodarone. -Continue anticoagulation with heparin/Coumadin as per cardiology, INR is still subtherapeutic at 1.92 this a.m. -appreciate cardiology assistance   #2  Anarsarca/acute on chronic diastolic CHF exacerbation Likely secondary to severe malnutrition in the setting of afib with RVR likely leading to acute on chronic diastolic CHF exacerbation with pulmonary edema.  Patient's albumin is 1.6. Chest x-ray was consistent with pulmonary edema. Patient neg 13.337L this admission -  IV Lasix-Lasix has had to be held intermittently secondary to hypotension, follow.   -continue Strict I.'s and O.'s. Daily weights. IV lasix changed to oral per cardiology. - 2-D echo with nl EF 55-60%, no wall motion abnormalities.  -Cardiology is following and appreciate assistance  #3 kleb urinary tract infection -kleb sensitive to Rocephin-day 7/7, d/c'ed 4/21  #4 left lower extremity cellulitis Improving clinically,  s/p IV vancomycin x 10 days.  #5 gastroesophageal reflux disease Continue PPI.  #6 type 2 diabetes Hemoglobin A1c 6.1. CBG 96-137. Continue sliding scale insulin.  #7 severe malnutrition/PEG placement Patient with 6-8 weeks of poor oral intake, decreased appetite. Megace started 4/15.  - s/p PEG tube placement 4/18 as above, continue tube feedings started 4/19 -patient tolerating well. Started on bolus feeding. Patient with loose stool. If loose stool continues may need to change TF. D/C colace and reglan.  -Discussed with patient's oncologist Dr. [name].  #8 history of breast cancer status post right mastectomy Pathologic staging PET 3, P. and 1, stage IIIa. ER and PR positive at 100%, HER -2/neu negative. Per oncology. -appreciate Dr Magrinat's input  #9 chronic liver disease -Continue diuretics. -contributing to likely underlying coagulopathy  #10 hypokalemia Secondary to diuretics.  -Repleted, continue to follow  #11 prophylaxis PPI for GI prophylaxis. Heparin for DVT prophylaxis.     Code Status: Full Family Communication: Updated patient and husband at bedside. Disposition Plan: Home with [facility] services when medically stable.   Consultants:  Cardiology: Dr. Graciela Husbands 08/24/2012  Oncology Dr. [name] 08/24/2012  Procedures:  2-D echo  08/26/2038  Chest x-ray 08/24/2012, 08/25/2012  PEG 08/27/12  Antibiotics:  IV Rocephin  08/24/12>>4/21  IV Vancomycin 08/25/12 >> 09/04/14  HPI/Subjective:  She denies chest pain or shortness of breath, states feeling better. Per nursing patient with loose stool. Tele with HR 70s-130s. Objective: Filed Vitals:   09/03/12 2052 09/04/12 0048 09/04/12 0419 09/04/12 0955  BP: 104/59 108/64 106/60 110/62  Pulse: 97 109 136 92  Temp: 97.8 F (36.6 C) 97.5 F (36.4 C) 97.9 F (36.6 C)   TempSrc: Oral Axillary Oral   Resp: 24 20 24    Height:      Weight:   94 kg (207 lb 3.7 oz)   SpO2: 95% 97% 94%     Intake/Output Summary (Last 24 hours) at 09/04/12 1124 Last data filed at 09/04/12 0947  Gross per 24 hour  Intake    498 ml  Output   3402 ml  Net  -2904 ml   Filed Weights   09/02/12 1030 09/03/12 0427 09/04/12 0419  Weight: 90 kg (198 lb 6.6 oz) 90.1 kg (198 lb 10.2 oz) 94 kg (207 lb 3.7 oz)    Exam:   General:  NAD. Alert and orientedx3  Cardiovascular: Irregularly irregular,mildy tachy  Respiratory:decreased BS at bases  Abdomen: Soft/NT/ND/+BS. PEG tube in place  Extremities: No c/c. +2-3 BLE edema to hips. Left lower leg near the ankle with mild area of erythema improved.  Data Reviewed: Basic Metabolic Panel:  Recent Labs Lab 08/29/12 0100 08/30/12 0530 08/31/12 0555 09/02/12 0425 09/03/12 0400 09/04/12 0503  NA 135 134* 135 137 133* 133*  K 3.8 3.3* 3.7 3.5 3.3* 3.5  CL 100 98 99 99 96 94*  CO2 29 32 34* 35* 36* 33*  GLUCOSE 96 100* 121* 106* 88 95  BUN 14 13 14 14 16 16   CREATININE 0.81 0.69 0.68 0.58 0.60 0.57  CALCIUM 7.4* 7.3* 7.4* 7.5* 7.5* 7.6*  MG 1.8  --   --   --  1.8  --    Liver Function Tests: No results found for this basename: AST, ALT, ALKPHOS, BILITOT, PROT, ALBUMIN,  in the last 168 hours No results found for this basename: LIPASE, AMYLASE,  in the last 168 hours No results found for this basename: AMMONIA,  in the last 168 hours CBC:  Recent Labs Lab 08/31/12 0830 09/01/12 0555 09/02/12 0425 09/03/12 0400  09/04/12 0503  WBC 3.5* 3.6* 4.0 5.6 8.9  HGB 10.7* 10.0* 10.2* 10.1* 10.4*  HCT 32.8* 31.1* 31.2* 31.4* 31.9*  MCV 85.0 85.4 85.5 85.3 85.5  PLT 117* 118* 130* 157 217   Cardiac Enzymes: No results found for this basename: CKTOTAL, CKMB, CKMBINDEX, TROPONINI,  in the last 168 hours BNP (last 3 results)  Recent Labs  08/24/12 1225 08/25/12 0400 08/26/12 0500  PROBNP 1816.0* 1915.0* 1184.0*   CBG:  Recent Labs Lab 09/03/12 0719 09/03/12 1213 09/03/12 1648 09/03/12 2106 09/04/12 0740  GLUCAP 86 108* 137* 97 96    No results found for this or any previous visit (from the past 240 hour(s)).   Studies: No results found.  Scheduled Meds: . amiodarone  400 mg Oral BID  . antiseptic oral rinse  15 mL Mouth Rinse BID  . antiseptic oral rinse  15 mL Mouth Rinse q12n4p  . atorvastatin  10 mg Oral QHS  . buPROPion  150 mg Oral q morning - 10a  . chlorhexidine  15 mL Mouth Rinse BID  . cholestyramine  4 g Oral BID  . diltiazem  240 mg Oral Daily  . docusate  100 mg Per Tube BID  . feeding supplement (JEVITY 1.2 CAL)  360 mL Per Tube QID  . feeding supplement  30 mL Per Tube TID WC  . free water  160 mL Per Tube TID PC & HS  . furosemide  80 mg Oral BID  . insulin aspart  0-15 Units Subcutaneous TID WC  . letrozole  2.5 mg Oral Daily  . levothyroxine  125 mcg Oral QAC breakfast  . megestrol  400 mg Oral BID  . metoCLOPramide  5 mg Oral TID AC & HS  . metoprolol tartrate  75 mg Oral BID  . multivitamin with minerals  1 tablet Oral Daily  . pantoprazole sodium  40 mg Per Tube Q0600  . potassium chloride  40 mEq Oral Daily  . saccharomyces boulardii  250 mg Oral BID  . sodium chloride  10-40 mL Intracatheter Q12H  . sodium chloride  3 mL Intravenous Q12H  . sodium chloride  3 mL Intravenous Q12H  . warfarin  4 mg Oral ONCE-1800  . Warfarin - Pharmacist Dosing Inpatient   Does not apply q1800   Continuous Infusions: . heparin 1,450 Units/hr (09/04/12 0649)     Principal Problem:   A-fib Active Problems:   HYPOTHYROIDISM   DIABETES MELLITUS, TYPE II   Morbid obesity   DEPRESSION   OBSTRUCTIVE SLEEP APNEA   GERD   DVT, HX OF   Liver disease, chronic, with cirrhosis   Cancer of breast   Hypokalemia   Obesity, unspecified   Severe malnutrition in the context of acute illness   Anasarca   UTI (lower urinary tract infection)   CHF, acute   Cellulitis of leg, left    Time spent: > 35 mins    [facility]  [facility] Pager [phone]. If 7PM-7AM, please contact night-coverage at [email or web address], password [facility] 09/04/2012, 11:24 AM  LOS: 11 days

## 2012-09-04 NOTE — Progress Notes (Signed)
ANTICOAGULATION CONSULT NOTE - Follow Up Consult  Pharmacy Consult for Heparin IV/coumadin PO Indication: Afib with RVR  Allergies  Allergen Reactions  . Olmesartan Medoxomil Cough    REACTION: ? if cough  . Tetracycline Hcl     Unknown reaction, too long for patient to remember   . Venlafaxine     REACTION: severe dry moouth  . Adhesive (Tape) Rash    Patient Measurements: Height: 5\' 4"  (162.6 cm) Weight: 207 lb 3.7 oz (94 kg) IBW/kg (Calculated) : 54.7  Vital Signs: Temp: 97.9 F (36.6 C) (04/26 0419) Temp src: Oral (04/26 0419) BP: 106/60 mmHg (04/26 0419) Pulse Rate: 136 (04/26 0419)  Labs:  Recent Labs  09/02/12 0425  09/03/12 0400 09/03/12 1032 09/04/12 0503  HGB 10.2*  --  10.1*  --  10.4*  HCT 31.2*  --  31.4*  --  31.9*  PLT 130*  --  157  --  217  LABPROT 17.4*  --  19.7*  --  21.2*  INR 1.47  --  1.73*  --  1.92*  HEPARINUNFRC 0.23*  < > 0.40 0.52 0.41  CREATININE 0.58  --  0.60  --  0.57  < > = values in this interval not displayed.  Estimated Creatinine Clearance: 85.2 ml/min (by C-G formula based on Cr of 0.57).   Assessment: 78 yoF admitted with LE swelling, acute CHF exacerbation with anasarca. PMHx significant for breast cancer s/p mastectomy, chemotherapy with no further cycles planned, CHF, malnutrition, and chronic liver disease with cirrhosis.  Pharmacy asked to dose IV heparin for newly diagnosed Afib.  Heparin level (0.41) remains therapeutic.  Heparin confirmed to be running at 14.5 ml/hr.  CBC:  Hgb is low/stable, Plt improved to wnl.  No bleeding reported in chart notes.  INR (1.92) remains subtherapeutic, but increasing.  Warfarin started 08/30/12.  Potential drug interaction with amiodarone noted, also on aspirin 81mg  daily.  Appropriately spacing administration times of cholestyramine with warfarin to avoid binding.  Goal of Therapy:  INR 2-3 Heparin level 0.3-0.7 units/ml Monitor platelets by anticoagulation protocol: Yes    Plan:   Continue heparin IV infusion at 1450 units/hr  Daily heparin level, INR, and CBC  Warfarin 4mg  PO today at 1800  Warfarin education prior to discharge.  Consider transitioning heparin to lovenox if no interventions planned?  Gwen Her PharmD  [phone] 09/04/2012 7:22 AM '

## 2012-09-04 NOTE — Progress Notes (Signed)
Pt had 8 beats of V. Tach on heart monitor. Pt asymptomatic, resting. VSS 108/64, 109, 73, 97.5 . Benedetto Coons, NP notified. No new orders at this time. Will continue to monitor. Newman Nip Rome

## 2012-09-05 DIAGNOSIS — C50919 Malignant neoplasm of unspecified site of unspecified female breast: Secondary | ICD-10-CM

## 2012-09-05 LAB — BASIC METABOLIC PANEL
BUN: 15 mg/dL (ref 6–23)
Calcium: 7.6 mg/dL — ABNORMAL LOW (ref 8.4–10.5)
Creatinine, Ser: 0.56 mg/dL (ref 0.50–1.10)
GFR calc Af Amer: >90 mL/min (ref >90)
GFR calc non Af Amer: >90 mL/min (ref >90)
Glucose, Bld: 96 mg/dL (ref 70–99)
Potassium: 3.8 mEq/L (ref 3.5–5.1)

## 2012-09-05 LAB — CBC
HCT: 29.7 % — ABNORMAL LOW (ref 36.0–46.0)
Hemoglobin: 9.6 g/dL — ABNORMAL LOW (ref 12.0–15.0)
MCH: 27.6 pg (ref 26.0–34.0)
MCHC: 32.3 g/dL (ref 30.0–36.0)
MCV: 85.3 fL (ref 78.0–100.0)
RDW: 25.2 % — ABNORMAL HIGH (ref 11.5–15.5)

## 2012-09-05 LAB — GLUCOSE, CAPILLARY
Glucose-Capillary: 97 mg/dL (ref 70–99)
Glucose-Capillary: 120 mg/dL — ABNORMAL HIGH (ref 70–99)

## 2012-09-05 LAB — PROTIME-INR: INR: 1.93 — ABNORMAL HIGH (ref 0.00–1.49)

## 2012-09-05 MED ORDER — ESCITALOPRAM OXALATE 10 MG PO TABS
10.0000 mg | ORAL_TABLET | Freq: Every day | ORAL | Status: DC
  Administered 2012-09-05: 10 mg via ORAL
  Filled 2012-09-05 (×3): qty 1

## 2012-09-05 MED ORDER — WARFARIN SODIUM 5 MG PO TABS
5.0000 mg | ORAL_TABLET | Freq: Once | ORAL | Status: AC
  Administered 2012-09-05: 5 mg via ORAL
  Filled 2012-09-05: qty 1

## 2012-09-05 NOTE — Progress Notes (Signed)
[facility] PROGRESS NOTE  [name] JXB:[id number] DOB: 11-05-1955 DOA: 08/24/2012 PCP: [name]., NP  Assessment/Plan: #1 A. fib with RVR Questionable etiology. May be secondary to stress and severe malnutrition and ongoing cancer. Cardiac enzymes are negative x3. Pro BNP is elevated. 2-D echo with nl EF. HR fluctuating between 70s-130s. -Patient was started on eliquis for anticoagulation per cardiology in anticipation of possible cardioversion, but eliquis was dc'ed and pt initially placed on full dose IV heparin in anticipation of possible PEG tube for her severe malnutrition, but due elevated INR, the heparin was dc'ed pending INR dropping to <2 then it was resumed. -INR is1.89 on 4/18 following vit k, Dr Suanne Marker discussed with IR- she was given FFP to bring INR closer to 1.5  and PEG was placed per IR on 4/18  -continue amiodarone and metoprolol per cards, cardizem drip was dc'ed 4/18, dig was subsequently started but HR control was still poor so it was dc'ed 4/21 -antihypertensives have had held as appropriate secondary to hypotension, holding off IVF given her anasarca -Rate still poorly controlled last 4/20, started on Cardizem drip 4/21 but stopped overnight(4/21) due to hypotension.  -Oral cardizem started as 4/23 per Cardiology and plans for TEE/ cardioversion now deferred as her heart rate is better controlled overnight. HR 70s - 130s. BP soft but per cardiology acceptable. Cardizem converted to long acting cardizem and increased per cardiology. Metoprolol changed to 75mg  BID. Continue amiodarone. -Continue anticoagulation with heparin/Coumadin as per cardiology, INR is still subtherapeutic at 1.93 this a.m. -appreciate cardiology assistance   #2  Anarsarca/acute on chronic diastolic CHF exacerbation Likely secondary to severe malnutrition in the setting of afib with RVR likely leading to acute on chronic diastolic CHF exacerbation with pulmonary edema.  Patient's albumin is 1.6. Chest x-ray was consistent with pulmonary edema. Patient neg 13.0L this admission -  IV Lasix-Lasix has had to be held intermittently secondary to hypotension, follow.   -continue Strict I.'s and O.'s. Daily weights. IV lasix changed to oral per cardiology. - 2-D echo with nl EF 55-60%, no wall motion abnormalities.  -Cardiology is following and appreciate assistance  #3 kleb urinary tract infection -kleb sensitive to Rocephin-day 7/7, d/c'ed 4/21  #4 left lower extremity cellulitis Improving clinically,  s/p IV vancomycin x 10 days.  #5 gastroesophageal reflux disease Continue PPI.  #6 type 2 diabetes Hemoglobin A1c 6.1. CBG 97-137. Continue sliding scale insulin.  #7 severe malnutrition/PEG placement Patient with 6-8 weeks of poor oral intake, decreased appetite. Megace started 4/15.  - s/p PEG tube placement 4/18 as above, continue tube feedings started 4/19 -patient tolerating well. Started on bolus feeding. Patient with loose stool. If loose stool continues may need to change TF. D/C colace and reglan.  -Discussed with patient's oncologist Dr. [name].  #8 history of breast cancer status post right mastectomy Pathologic staging PET 3, P. and 1, stage IIIa. ER and PR positive at 100%, HER -2/neu negative. Per oncology. -appreciate Dr Magrinat's input  #9 chronic liver disease -Continue diuretics. -contributing to likely underlying coagulopathy  #10 hypokalemia Secondary to diuretics.  -Repleted, continue to follow  #11 prophylaxis PPI for GI prophylaxis. Heparin for DVT prophylaxis.     Code Status: Full Family Communication: Updated patient and husband at bedside. Disposition Plan: Home with [facility] services when medically stable.   Consultants:  Cardiology: Dr. Graciela Husbands 08/24/2012  Oncology Dr. [name] 08/24/2012  Procedures:  2-D echo  08/26/2038  Chest x-ray 08/24/2012, 08/25/2012  PEG 08/27/12  Antibiotics:  IV Rocephin  08/24/12>>4/21  IV Vancomycin 08/25/12 >> 09/04/14  HPI/Subjective:  She denies chest pain or shortness of breath, states feeling better. Patient states ambulated with PT yesterday. Tele with HR 70s-130s. Objective: Filed Vitals:   09/04/12 1340 09/04/12 2044 09/04/12 2135 09/05/12 0546  BP: 105/80 107/61  110/71  Pulse: 97 102 98 112  Temp: 97.8 F (36.6 C) 97.6 F (36.4 C)  97.9 F (36.6 C)  TempSrc: Oral Oral  Oral  Resp: 20 24 22 16   Height:      Weight:    97.8 kg (215 lb 9.8 oz)  SpO2: 96% 98% 98% 96%    Intake/Output Summary (Last 24 hours) at 09/05/12 1415 Last data filed at 09/05/12 1200  Gross per 24 hour  Intake   1508 ml  Output   1076 ml  Net    432 ml   Filed Weights   09/03/12 0427 09/04/12 0419 09/05/12 0546  Weight: 90.1 kg (198 lb 10.2 oz) 94 kg (207 lb 3.7 oz) 97.8 kg (215 lb 9.8 oz)    Exam:   General:  NAD. Alert and orientedx3  Cardiovascular: Irregularly irregular,mildy tachy  Respiratory:decreased BS at bases  Abdomen: Soft/NT/ND/+BS. PEG tube in place  Extremities: No c/c. +2-3 BLE edema to hips. Left lower leg near the ankle with mild area of erythema improved.  Data Reviewed: Basic Metabolic Panel:  Recent Labs Lab 08/31/12 0555 09/02/12 0425 09/03/12 0400 09/04/12 0503 09/05/12 0606  NA 135 137 133* 133* 134*  K 3.7 3.5 3.3* 3.5 3.8  CL 99 99 96 94* 98  CO2 34* 35* 36* 33* 33*  GLUCOSE 121* 106* 88 95 96  BUN 14 14 16 16 15   CREATININE 0.68 0.58 0.60 0.57 0.56  CALCIUM 7.4* 7.5* 7.5* 7.6* 7.6*  MG  --   --  1.8  --   --    Liver Function Tests: No results found for this basename: AST, ALT, ALKPHOS, BILITOT, PROT, ALBUMIN,  in the last 168 hours No results found for this basename: LIPASE, AMYLASE,  in the last 168 hours No results found for this basename: AMMONIA,  in the last 168 hours CBC:  Recent Labs Lab 09/01/12 0555 09/02/12 0425 09/03/12 0400 09/04/12 0503 09/05/12 0606  WBC 3.6* 4.0 5.6 8.9 6.4  HGB 10.0*  10.2* 10.1* 10.4* 9.6*  HCT 31.1* 31.2* 31.4* 31.9* 29.7*  MCV 85.4 85.5 85.3 85.5 85.3  PLT 118* 130* 157 217 198   Cardiac Enzymes: No results found for this basename: CKTOTAL, CKMB, CKMBINDEX, TROPONINI,  in the last 168 hours BNP (last 3 results)  Recent Labs  08/24/12 1225 08/25/12 0400 08/26/12 0500  PROBNP 1816.0* 1915.0* 1184.0*   CBG:  Recent Labs Lab 09/04/12 1216 09/04/12 1658 09/04/12 2048 09/05/12 0744 09/05/12 1207  GLUCAP 121* 130* 137* 97 131*    No results found for this or any previous visit (from the past 240 hour(s)).   Studies: No results found.  Scheduled Meds: . amiodarone  400 mg Oral BID  . antiseptic oral rinse  15 mL Mouth Rinse BID  . antiseptic oral rinse  15 mL Mouth Rinse q12n4p  . atorvastatin  10 mg Oral QHS  . buPROPion  150 mg Oral q morning - 10a  . chlorhexidine  15 mL Mouth Rinse BID  . cholestyramine  4 g Oral BID  . diltiazem  240 mg Oral Daily  . escitalopram  10 mg Oral QHS  . feeding supplement (JEVITY 1.2  CAL)  360 mL Per Tube QID  . feeding supplement  30 mL Per Tube TID WC  . free water  160 mL Per Tube TID PC & HS  . furosemide  80 mg Oral BID  . insulin aspart  0-15 Units Subcutaneous TID WC  . letrozole  2.5 mg Oral Daily  . levothyroxine  125 mcg Oral QAC breakfast  . megestrol  400 mg Oral BID  . metoprolol tartrate  75 mg Oral BID  . multivitamin with minerals  1 tablet Oral Daily  . pantoprazole sodium  40 mg Per Tube Q0600  . potassium chloride  40 mEq Oral Daily  . saccharomyces boulardii  250 mg Oral BID  . sodium chloride  10-40 mL Intracatheter Q12H  . sodium chloride  3 mL Intravenous Q12H  . sodium chloride  3 mL Intravenous Q12H  . warfarin  5 mg Oral ONCE-1800  . Warfarin - Pharmacist Dosing Inpatient   Does not apply q1800   Continuous Infusions: . heparin 1,450 Units/hr (09/04/12 2311)    Principal Problem:   A-fib Active Problems:   HYPOTHYROIDISM   DIABETES MELLITUS, TYPE II    Morbid obesity   DEPRESSION   OBSTRUCTIVE SLEEP APNEA   GERD   DVT, HX OF   Liver disease, chronic, with cirrhosis   Cancer of breast   Hypokalemia   Obesity, unspecified   Severe malnutrition in the context of acute illness   Anasarca   UTI (lower urinary tract infection)   CHF, acute   Cellulitis of leg, left    Time spent: > 35 mins    [facility]  [facility] Pager [phone]. If 7PM-7AM, please contact night-coverage at [email or web address], password [facility] 09/05/2012, 2:15 PM  LOS: 12 days

## 2012-09-05 NOTE — Progress Notes (Signed)
Patient ID: [name], female   DOB: 1955-09-20, 57 y.o.   MRN: [id number]   Telemetry reveals atrial fibrillation with a controlled rate. There is one episode of 5 wide complex beats with a rate of 120. Plan to continue to follow electrolytes. No further workup of this issue.  Jerral Bonito, MD

## 2012-09-05 NOTE — Progress Notes (Signed)
Husband brought ham, pintos and lima beans for pt, she ate 1/4 to 1/2 of it, and wanted to skip this jevity feeding due to feeling so full.  Peg residual has been 0 today

## 2012-09-05 NOTE — Progress Notes (Signed)
Pt already on CPAP when RT went by room, RT to monitor and assess as needed.

## 2012-09-05 NOTE — Progress Notes (Signed)
ANTICOAGULATION CONSULT NOTE - Follow Up Consult  Pharmacy Consult for Heparin IV/coumadin PO Indication: Afib with RVR  Allergies  Allergen Reactions  . Olmesartan Medoxomil Cough    REACTION: ? if cough  . Tetracycline Hcl     Unknown reaction, too long for patient to remember   . Venlafaxine     REACTION: severe dry moouth  . Adhesive (Tape) Rash    Patient Measurements: Height: 5\' 4"  (162.6 cm) Weight: 215 lb 9.8 oz (97.8 kg) IBW/kg (Calculated) : 54.7  Vital Signs: Temp: 97.9 F (36.6 C) (04/27 0546) Temp src: Oral (04/27 0546) BP: 110/71 mmHg (04/27 0546) Pulse Rate: 112 (04/27 0546)  Labs:  Recent Labs  09/03/12 0400 09/03/12 1032 09/04/12 0503 09/05/12 0606  HGB 10.1*  --  10.4* 9.6*  HCT 31.4*  --  31.9* 29.7*  PLT 157  --  217 198  LABPROT 19.7*  --  21.2* 21.3*  INR 1.73*  --  1.92* 1.93*  HEPARINUNFRC 0.40 0.52 0.41 0.41  CREATININE 0.60  --  0.57 0.56    Estimated Creatinine Clearance: 87 ml/min (by C-G formula based on Cr of 0.56).   Assessment: 31 yoF admitted with LE swelling, acute CHF exacerbation with anasarca. PMHx significant for breast cancer s/p mastectomy, chemotherapy with no further cycles planned, CHF, malnutrition, and chronic liver disease with cirrhosis.  Pharmacy asked to dose IV heparin for newly diagnosed Afib.  Heparin level (0.41) remains therapeutic/stable.  Heparin confirmed to be running at 14.5 ml/hr.  CBC:  Hgb down, Plt improved to wnl.  No bleeding reported in chart notes.  INR (1.93) remains, essentially unchanged. Doses since 08/30/12: 2, 2, 4, 4, 4, 4mg   Potential drug interaction with amiodarone noted - may increase INR (may not see effect for several weeks given long t1/2 of amiodarone), also on aspirin 81mg  daily.  Appropriately spacing administration times of cholestyramine with warfarin to avoid binding.  Goal of Therapy:  INR 2-3 Heparin level 0.3-0.7 units/ml Monitor platelets by anticoagulation protocol:  Yes   Plan:   Continue heparin IV infusion at 1450 units/hr  Daily heparin level, INR, and CBC  Warfarin 5mg  PO today at 1800  Warfarin education prior to discharge.  Consider transitioning heparin to lovenox if no interventions planned?  Gwen Her PharmD  [phone] 09/05/2012 8:31 AM '

## 2012-09-06 ENCOUNTER — Telehealth: Payer: Self-pay | Admitting: Cardiovascular Disease

## 2012-09-06 ENCOUNTER — Telehealth: Payer: Self-pay | Admitting: Family

## 2012-09-06 LAB — BASIC METABOLIC PANEL
Calcium: 7.6 mg/dL — ABNORMAL LOW (ref 8.4–10.5)
Creatinine, Ser: 0.59 mg/dL (ref 0.50–1.10)
GFR calc non Af Amer: >90 mL/min (ref >90)
Glucose, Bld: 103 mg/dL — ABNORMAL HIGH (ref 70–99)
Sodium: 133 mEq/L — ABNORMAL LOW (ref 135–145)

## 2012-09-06 LAB — CBC
Hemoglobin: 9.7 g/dL — ABNORMAL LOW (ref 12.0–15.0)
MCH: 27.6 pg (ref 26.0–34.0)
MCHC: 32 g/dL (ref 30.0–36.0)
MCV: 86.1 fL (ref 78.0–100.0)

## 2012-09-06 LAB — PROTIME-INR: Prothrombin Time: 23.4 seconds — ABNORMAL HIGH (ref 11.6–15.2)

## 2012-09-06 LAB — HEPARIN LEVEL (UNFRACTIONATED): Heparin Unfractionated: 0.46 IU/mL (ref 0.30–0.70)

## 2012-09-06 MED ORDER — WARFARIN SODIUM 4 MG PO TABS: 4.0000 mg | ORAL_TABLET | Freq: Once | ORAL | Status: DC

## 2012-09-06 MED ORDER — WARFARIN SODIUM 4 MG PO TABS
4.0000 mg | ORAL_TABLET | Freq: Once | ORAL | Status: DC
  Filled 2012-09-06: qty 1

## 2012-09-06 MED ORDER — POTASSIUM CHLORIDE ER 10 MEQ PO TBCR: 10.0000 meq | EXTENDED_RELEASE_TABLET | Freq: Every day | ORAL | Status: DC

## 2012-09-06 MED ORDER — DILTIAZEM HCL ER COATED BEADS 240 MG PO CP24: 240.0000 mg | ORAL_CAPSULE | Freq: Every day | ORAL | Status: DC

## 2012-09-06 MED ORDER — ESCITALOPRAM OXALATE 10 MG PO TABS: 10.0000 mg | ORAL_TABLET | Freq: Every day | ORAL | Status: DC

## 2012-09-06 MED ORDER — AMIODARONE HCL 200 MG PO TABS: 400.0000 mg | ORAL_TABLET | Freq: Two times a day (BID) | ORAL | Status: DC

## 2012-09-06 MED ORDER — FUROSEMIDE 80 MG PO TABS: 80.0000 mg | ORAL_TABLET | Freq: Two times a day (BID) | ORAL | Status: DC

## 2012-09-06 MED ORDER — FREE WATER: 160.0000 mL | Freq: Three times a day (TID) | Status: DC

## 2012-09-06 MED ORDER — JEVITY 1.2 CAL PO LIQD: 360.0000 mL | Freq: Four times a day (QID) | ORAL | Status: DC

## 2012-09-06 MED ORDER — PRO-STAT SUGAR FREE PO LIQD: 30.0000 mL | Freq: Three times a day (TID) | ORAL | Status: DC

## 2012-09-06 MED ORDER — MEGESTROL ACETATE 400 MG/10ML PO SUSP: 400.0000 mg | Freq: Two times a day (BID) | ORAL | Status: DC

## 2012-09-06 MED ORDER — METOPROLOL TARTRATE 25 MG PO TABS: 75.0000 mg | ORAL_TABLET | Freq: Two times a day (BID) | ORAL | Status: DC

## 2012-09-06 MED ORDER — HEPARIN SOD (PORK) LOCK FLUSH 100 UNIT/ML IV SOLN
500.0000 [IU] | INTRAVENOUS | Status: DC | PRN
  Administered 2012-09-06: 500 [IU]

## 2012-09-06 MED ORDER — HEPARIN SOD (PORK) LOCK FLUSH 100 UNIT/ML IV SOLN: 500.0000 [IU] | INTRAVENOUS | Status: DC

## 2012-09-06 NOTE — Telephone Encounter (Signed)
Will await results of INR Wednesday. Will forward to coumadin clinic so they will be aware INR should be coming in from home health.

## 2012-09-06 NOTE — Progress Notes (Signed)
Physical Therapy Treatment Patient Details Name: EZMA REHM MRN: [id number] DOB: 1955/08/23 Today's Date: 09/06/2012 Time: 0917-0930 PT Time Calculation (min): 13 min  PT Assessment / Plan / Recommendation Comments on Treatment Session  Pt encouraged to ambulate prior to d/c however had BM upon standing requiring bathing and dressing changes so assisted to [facility] and left with nsg tech.    Follow Up Recommendations  Home health PT;Supervision/Assistance - 24 hour     Does the patient have the potential to tolerate intense rehabilitation     Barriers to Discharge        Equipment Recommendations  None recommended by PT    Recommendations for Other Services    Frequency     Plan Discharge plan remains appropriate;Frequency remains appropriate    Precautions / Restrictions Precautions Precautions: Fall Precaution Comments: Monitor HR   Pertinent Vitals/Pain n/a    Mobility  Bed Mobility Details for Bed Mobility Assistance: EOB with OT upon entering Transfers Transfers: Sit to Stand;Stand to Sit;Stand Pivot Transfers Sit to Stand: 1: +2 Total assist;From bed;With upper extremity assist Sit to Stand: Patient Percentage: 70% Stand to Sit: 1: +2 Total assist Stand to Sit: Patient Percentage: 70% Stand Pivot Transfers: 1: +2 Total assist Stand Pivot Transfers: Patient Percentage: 80% Details for Transfer Assistance: max verbal cues for safe technique as pt would follow cue then grab RW, also verbal cue for forward lean and unweighting hips, assist to rise and stabilize and control descent, pt had BM upon standing so assisted to [facility] then called nsg tech for clean up/RN to change dressings    Exercises     PT Diagnosis:    PT Problem List:   PT Treatment Interventions:     PT Goals Acute Rehab PT Goals PT Goal: Sit to Stand - Progress: Progressing toward goal PT Goal: Stand to Sit - Progress: Progressing toward goal PT Transfer Goal: Bed to Chair/Chair to Bed - Progress:  Progressing toward goal  Visit Information  Last PT Received On: 09/06/12 Assistance Needed: +2 PT/OT Co-Evaluation/Treatment: Yes    Subjective Data  Subjective: I'll do better once I'm at home.     Cognition  Cognition Arousal/Alertness: Awake/alert Behavior During Therapy: WFL for tasks assessed/performed General Comments: decreased insight into need for assist, pt reports she will be better once home however states need for assist to stand today    Balance     End of Session PT - End of Session Activity Tolerance: Patient tolerated treatment well Patient left: with family/visitor present;with nursing in room;Other (comment) (on BSC)   GP     Viet Kemmerer,KATHrine E 09/06/2012, 10:21 AM Zenovia Jarred, PT, DPT 09/06/2012 Pager: [phone]

## 2012-09-06 NOTE — Telephone Encounter (Signed)
Please call pt to arrange hospital follow up in 1-2 weeks.

## 2012-09-06 NOTE — Telephone Encounter (Signed)
New problem   FYI pt is being discharged today and will have INR + PT on 09/08/12 results will be given to Dr Elease Hashimoto. She first stated Dr Excell Seltzer would be getting results but then at end of conversation she said it may be Dr Elease Hashimoto.

## 2012-09-06 NOTE — Progress Notes (Signed)
ANTICOAGULATION CONSULT NOTE - Follow Up Consult  Pharmacy Consult for Heparin IV/coumadin PO Indication: Afib with RVR  Allergies  Allergen Reactions  . Olmesartan Medoxomil Cough    REACTION: ? if cough  . Tetracycline Hcl     Unknown reaction, too long for patient to remember   . Venlafaxine     REACTION: severe dry moouth  . Adhesive (Tape) Rash    Patient Measurements: Height: 5\' 4"  (162.6 cm) Weight: 215 lb 2.7 oz (97.6 kg) IBW/kg (Calculated) : 54.7  Vital Signs: Temp: 98.1 F (36.7 C) (04/28 0546) Temp src: Oral (04/28 0546) BP: 110/64 mmHg (04/28 0546) Pulse Rate: 95 (04/28 0546)  Labs:  Recent Labs  09/04/12 0503 09/05/12 0606 09/06/12 0500  HGB 10.4* 9.6* 9.7*  HCT 31.9* 29.7* 30.3*  PLT 217 198 233  LABPROT 21.2* 21.3* 23.4*  INR 1.92* 1.93* 2.19*  HEPARINUNFRC 0.41 0.41 0.46  CREATININE 0.57 0.56 0.59    Estimated Creatinine Clearance: 87 ml/min (by C-G formula based on Cr of 0.59).   Assessment: 87 yoF admitted with LE swelling, acute CHF exacerbation with anasarca. PMHx significant for breast cancer s/p mastectomy, chemotherapy with no further cycles planned, CHF, malnutrition, and chronic liver disease with cirrhosis.  Pharmacy asked to dose IV heparin for newly diagnosed Afib.  CBC improved. No bleeding reported in chart notes.  INR therapeutic today (2.19). Doses since 08/30/12: 2, 2, 4, 4, 4, 4, 5mg   Potential drug interaction with amiodarone noted - may increase INR (may not see effect for several weeks given long t1/2 of amiodarone), also on aspirin 81mg  daily.  Appropriately spacing administration times of cholestyramine with warfarin to avoid binding.  Goal of Therapy:  INR 2-3 Heparin level 0.3-0.7 units/ml Monitor platelets by anticoagulation protocol: Yes   Plan:   Warfarin 4mg  PO today at 1800  D/C heparin  Continue daily PT/INR  Loralee Pacas, PharmD, BCPS Pager: [phone]  09/06/2012 7:49 AM '

## 2012-09-06 NOTE — Progress Notes (Signed)
Physical Therapy Treatment Note   09/06/12 1300  PT Visit Information  Last PT Received On 09/06/12  Assistance Needed +2  PT Time Calculation  PT Start Time 1033  PT Stop Time 1045  PT Time Calculation (min) 12 min  Subjective Data  Subjective pt in chair and family member requesting ambulation prior to d/c.  Precautions  Precautions Fall  Precaution Comments Monitor HR  Cognition  Arousal/Alertness Awake/alert  Behavior During Therapy WFL for tasks assessed/performed  Bed Mobility  Bed Mobility Not assessed  Transfers  Transfers Sit to Stand;Stand to Sit  Sit to Stand 1: +2 Total assist;From bed;With upper extremity assist  Sit to Stand: Patient Percentage 70%  Stand to Sit 1: +2 Total assist  Stand to Sit: Patient Percentage 70%  Details for Transfer Assistance reviewed safe transfers with pt and spouse  Ambulation/Gait  Ambulation/Gait Assistance 4: Min assist  Ambulation Distance (Feet) 80 Feet  Assistive device Rolling walker  Ambulation/Gait Assistance Details +2 for safety and chair following, no rest break required today, HR 109 during ambulation  Gait Pattern Step-through pattern;Decreased stride length  Gait velocity decreased  General Gait Details declined increasing distance due to d/c home today (save energy)  PT - End of Session  Activity Tolerance Patient tolerated treatment well  Patient left in chair;with call bell/phone within reach;with family/visitor present  PT - Assessment/Plan  Comments on Treatment Session Pt ambulated in hallway without rest break today.  Pt and spouse educated on safe sit <-> stands with RW.  Pt plans to d/c home today.  PT Plan Discharge plan remains appropriate;Frequency remains appropriate  Follow Up Recommendations Home health PT;Supervision/Assistance - 24 hour  PT equipment None recommended by PT  Acute Rehab PT Goals  PT Goal: Sit to Stand - Progress Progressing toward goal  PT Goal: Stand to Sit - Progress Progressing  toward goal  PT Goal: Ambulate - Progress Progressing toward goal  PT General Charges  $$ ACUTE PT VISIT 1 Procedure  PT Treatments  $Gait Training 8-22 mins   Zenovia Jarred, PT, DPT 09/06/2012 Pager: [phone]

## 2012-09-06 NOTE — Progress Notes (Signed)
Patient discharged home in stable condition with home health..  No changes from morning assessment.  Instructions and scripts given to husband with verbal understanding and feedback.  Pt has active mychart status.

## 2012-09-06 NOTE — Progress Notes (Signed)
    Subjective:  No chest pain, dyspnea, or palps  Objective:  Vital Signs in the last 24 hours: Temp:  [98 F (36.7 C)-98.1 F (36.7 C)] 98.1 F (36.7 C) (04/28 0546) Pulse Rate:  [83-95] 95 (04/28 0546) Resp:  [18-20] 20 (04/28 0546) BP: (106-110)/(64-71) 110/64 mmHg (04/28 0546) SpO2:  [97 %] 97 % (04/28 0546) Weight:  [215 lb 2.7 oz (97.6 kg)] 215 lb 2.7 oz (97.6 kg) (04/28 0546)  Intake/Output from previous day: 04/27 0701 - 04/28 0700 In: 1100 [P.O.:120] Out: 1000 [Urine:1000]  Physical Exam: Pt is alert and oriented, chronically ill-appearing woman in NAD HEENT: normal except alopecia Neck: JVP - normal Lungs: mildly diminished BS bases CV: irregularly irregular without murmur or gallop Abd: soft, NT, Positive BS Ext: diffuse edema Skin: warm/dry no rash  Lab Results:  Recent Labs  09/05/12 0606 09/06/12 0500  WBC 6.4 9.8  HGB 9.6* 9.7*  PLT 198 233    Recent Labs  09/05/12 0606 09/06/12 0500  NA 134* 133*  K 3.8 4.5  CL 98 98  CO2 33* 32  GLUCOSE 96 103*  BUN 15 16  CREATININE 0.56 0.59    Tele: AFib - rate controlled; occasional aberrantly conducted beats.  Assessment/Plan:  1. AF with RVR - pt stable on current Rx. INR therapeutic; DC heparin. Recommend the following with her meds:  Continue amiodarone 400 mg BID x 4 more days, then 200 mg daily  Continue cardizem CD 240 mg daily and metoprolol 75 mg BID  Patient should FU closely with coumadin clinic or oncology to monitor INR.  2. Acute diastolic CHF. Major reason for edema is severe protein-calorie malnutrition/hypoalbuminemia. Will continue lasix 80 mg BID. LVEF normal.  Would arrange follow-up with Dr Elease Hashimoto or his PA in the next 1-2 weeks following DC. Depending on her clinical course as an outpatient, will consider elective DCCV after 3 weeks therapeutic anticoagulation.  Olga Millers, M.D. 09/06/2012, 7:01 AM

## 2012-09-06 NOTE — Discharge Summary (Signed)
Physician Discharge Summary  [name]:[id number] DOB: February 07, 1956 DOA: 08/24/2012  PCP: [name]., NP  Admit date: 08/24/2012 Discharge date: 09/06/2012  Time spent: 65 minutes  Recommendations for Outpatient Follow up: #1 patient is to followup with his oncologist one week post discharge on a scheduled. #2 patient is to followup with Dr. Elease Hashimoto cardiology in one to 2 weeks.  #3 patient is to followup with PCP in 1-2 weeks.  Discharge Diagnoses:  Principal Problem:   A-fib Active Problems:   HYPOTHYROIDISM   DIABETES MELLITUS, TYPE II   Morbid obesity   DEPRESSION   OBSTRUCTIVE SLEEP APNEA   GERD   DVT, HX OF   Liver disease, chronic, with cirrhosis   Cancer of breast   Hypokalemia   Obesity, unspecified   Severe malnutrition in the context of acute illness   Anasarca   UTI (lower urinary tract infection)   CHF, acute   Cellulitis of leg, left   Discharge Condition: Stable and improved  Diet recommendation: Heart healthy  Filed Weights   09/04/12 0419 09/05/12 0546 09/06/12 0546  Weight: 94 kg (207 lb 3.7 oz) 97.8 kg (215 lb 9.8 oz) 97.6 kg (215 lb 2.7 oz)    History of present illness:  [name] is a 57 y.o. 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 Patient is presenting to the ED with worsening lower extremity edema. Patient was discharged from the hospital to a skilled nursing facility however however after 2-3 days at the facility patient and family were not happy with the services  received at the skilled nursing facility stating that patient was left in the wheelchair all day and also patient's medications were not being given toward the way they were supposed to and a such patient went home with home health services.  Home health nurse today per family daughter that patient had worsening lower extremity edema she stated that the patient sounded congested on exam and subsequently patient was sent to the emergency room. Patient denies any fever, no chills, no nausea, no vomiting, no chest pain, no shortness of breath, no abdominal pain, no diarrhea, no constipation, no dysuria, no generalized weakness. Patient does endorse a nonproductive cough. No other associated symptoms.  Patient was seen in the emergency room was noted to have a heart rate in the 150s and subsequently placed on a Cardizem drip after being given a dose of IV metoprolol. Patient also noted on exam to have lower extremity edema per ED physician and 40 of Lasix were given. Chest x-ray which was done did show some infiltrates concerning for atelectasis versus infiltrate. CBC done had a normal white count. Comprehensive metabolic profile did have a sodium of 134 elevated alk phosphatase of 242 albumin of 1.6 AST of 50 ALT of 41 protein of 5.5 otherwise was within normal limits. EKG showed atrial fibrillation with RVR. Urinalysis done was nitrite positive leukocytes large WBCs with too numerous to count.  Will call to admit the patient for further evaluation and management.      Hospital Course:  #1 A. fib with RVR  patient was admitted in A. fib with heart rates in the  150s. Patient was placed on a Cardizem drip and admitted to the step down unit. It was felt patient's A. fib with RVR was likely secondary to stress and severe malnutrition and ongoing cancer. Cardiac enzymes were negative x3. Pro BNP is elevated. 2-D echo with nl EF. Patient was monitored and a cardiology consultation was obtained. Patient was seen in  consultation by Dr. Graciela Husbands who initially started patient on eliquis, in anticipation of possible cardioversion. Eliquis was discontinued and patient placed on IV heparin in anticipation of PEG tube placement for her severe malnutrition. Due to elevated INR, the heparin was dc'ed pending INR dropping to <2 then it was resumed. Patient's INR was reversed using vitamin K and FFP. Patient subsequently underwent PEG placement per interventional radiology on 08/27/2012. Amiodarone and metoprolol were added initially to the Cardizem drip for rate control. Cardizem drip was discontinued however subsequently to uncontrolled heart rate was resumed. IV fluids were not placed secondary to patient's anasarca. Patient's antihypertensive medications were held. After Cardizem drip was resumed on 08/30/2012 it was stopped due to hypotension. Patient was subsequently started on oral Cardizem on 423 per cardiology with plans for TEE/cardioversion if her rate was well controlled. Patient's heart rate ranged in the 70s to the 130s. Patient's Cardizem was subsequently converted to long-acting Cardizem and dose increased to 240 mg per cardiology. Patient saw metoprolol was changed and placed on 75 mg twice daily. Patient was maintained on amiodarone and followed. Patient had some occasional beat PVCs however rate was controlled. Once PEG tube was placed patient was started back on IV heparin as well as Coumadin. Her INR was followed and once the INR was in the therapeutic range of 2-3 patient's heparin was discontinued. Patient be discharged home in stable condition on amiodarone, Lopressor, Cardizem for rate control. Patient will be discharged on Coumadin for anticoagulations. Patient will need a PT INR checked on Wednesday, 09/08/2012 2. Results will be called in to cardiology/ coumadin clinic. #2 Anarsarca/acute on chronic diastolic CHF exacerbation  On admission patient was noted to have anasarca with 4+ bilateral lower extremity  edema up to the hips. Likely secondary to severe malnutrition in the setting of afib with RVR likely leading to acute on chronic diastolic CHF exacerbation with pulmonary edema. Patient's albumin was 1.6. Chest x-ray was consistent with pulmonary edema. Patient was diureses aggressively with IV Lasix with clinical improvement. Cardiac enzymes which was cycled were negative x3. 2-D echo was obtained which showed a normal EF of 55-60% with no wall motion abnormalities. Patient diureses well and was subsequently transitioned to oral Lasix 80 mg twice daily. Patient was neg 12.9L this admission. PEG tube was placed for nutrition. Patient improved clinically and will be discharged in stable and improved condition and is to followup with cardiology as outpatient. #3 kleb urinary tract infection  Urinalysis obtained on admission was consistent with a UTI. Patient was placed on IV Rocephin. Urine cultures grew out Klebsiella. Patient received 7 days of antibiotic therapy. #4 left lower extremity cellulitis  Patient on admission was noted to have an area of cellulitis on the left lower extremity above the medial malleolus. Patient was placed on IV vancomycin and received 10 days worth of antibiotic therapy with significant clinical improvement and return back to her baseline.  #5 gastroesophageal reflux disease  Stable. Was maintained on a PPI.   #6 type 2 diabetes  Hemoglobin A1c 6.1. CBG 97-137. Continue sliding scale insulin.  #7 severe malnutrition/PEG placement  Patient with 6-8 weeks  of poor oral intake, decreased appetite. Megace started 4/15. IR was consulted and patient underwent PEG tube placement 4/18 as above. Patient was started on tube feedings which she tolerated. Patient was subsequently transitioned to bolus feeds and husband instructed on how to do her feedings. Patient be discharged home with home health RN.  #8 history of breast cancer status post right mastectomy  Pathologic staging PET 3,  P. and 1, stage IIIa. ER and PR positive at 100%, HER -2/neu negative. Per oncology. Followup as outpatient. #9 chronic liver disease  Remained stable. Patient was maintained on diuretics. #10 hypokalemia  Secondary to diuretics.  -Repleted, continue to follow   Procedures: 2-D echo 08/26/2038  Chest x-ray 08/24/2012, 08/25/2012  PEG 08/27/12     Consultations: Cardiology: Dr. Graciela Husbands 08/24/2012  Oncology Dr. [name] 08/24/2012     Discharge Exam: Filed Vitals:   09/04/12 2135 09/05/12 0546 09/05/12 1530 09/06/12 0546  BP:  110/71 106/71 110/64  Pulse: 98 112 83 95  Temp:  97.9 F (36.6 C) 98 F (36.7 C) 98.1 F (36.7 C)  TempSrc:  Oral Oral Oral  Resp: 22 16 18 20   Height:      Weight:  97.8 kg (215 lb 9.8 oz)  97.6 kg (215 lb 2.7 oz)  SpO2: 98% 96% 97% 97%    General: NAD Cardiovascular: Irregularly irregular Respiratory: CTAB Extremities : 2-3 +BLE edema to mid thighs  Discharge Instructions      Discharge Orders   Future Appointments Provider Department Dept Phone   09/09/2012 8:30 AM Delcie Roch [facility] [phone]   09/09/2012 9:00 AM Lowella Dell, MD [facility] MEDICAL ONCOLOGY [phone]   09/09/2012 10:30 AM [facility] [place] [facility] RAD[facility]COL[facility] [phone]   09/09/2012 11:00 AM Lurline Hare, MD  [facility] [phone]   09/10/2012 4:00 PM Sandford Craze, NP [facility] [phone]   Future Orders Complete By Expires     Diet - low sodium heart healthy  As directed     Discharge instructions  As directed     Comments:      Patient to follow with Dr Elease Hashimoto in 1-2 weeks. Patient to follow up with Dr [name] as scheduled on 09/09/12 Patient to follow up with [name]., NP in 1-2 weeks HHRN to draw PT/INR on Wednesday 09/08/12    Increase activity slowly  As directed         Medication List     STOP taking these medications       enoxaparin 40 MG/0.4ML injection  Commonly known as:  LOVENOX     metoprolol succinate 25 MG 24 hr tablet  Commonly known as:  TOPROL-XL     sodium chloride 0.9 % SOLN 1,000 mL with potassium chloride 2 mEq/ml SOLN 10 mEq      TAKE these medications       amiodarone 200 MG tablet  Commonly known as:  PACERONE  Take 2 tablets (400 mg total) by mouth 2 (two) times daily. Take 2 tablets (400mg ) 2 times daily x 4 days, then 1 tablet (200mg ) daily     antiseptic oral rinse Liqd  15 mLs by Mouth Rinse route 2 (two) times daily as needed.     aspirin EC 81 MG tablet  Take 81 mg by mouth every morning.     atorvastatin 10 MG tablet  Commonly known as:  LIPITOR  Take 10 mg  by mouth at bedtime.     buPROPion 150 MG 12 hr tablet  Commonly known as:  WELLBUTRIN SR  Take 150 mg by mouth every morning.     cholestyramine 4 G packet  Commonly known as:  QUESTRAN  Take 1 packet (4 g total) by mouth 2 (two) times daily.     diltiazem 240 MG 24 hr capsule  Commonly known as:  CARDIZEM CD  Take 1 capsule (240 mg total) by mouth daily.     escitalopram 10 MG tablet  Commonly known as:  LEXAPRO  Take 1 tablet (10 mg total) by mouth at bedtime.     feeding supplement Liqd  Take 1 Container by mouth 2 (two) times daily between meals.     feeding supplement (JEVITY 1.2 CAL) Liqd  Place 360 mLs into feeding tube 4 (four) times daily.     feeding supplement Liqd  Place 30 mLs into feeding tube 3 (three) times daily with meals.     free water Soln  Place 160 mLs into feeding tube 4 (four) times daily - after meals and at bedtime.     furosemide 80 MG tablet  Commonly known as:  LASIX  Take 1 tablet (80 mg total) by mouth 2 (two) times daily.     insulin aspart 100 UNIT/ML injection  Commonly known as:  novoLOG  Inject 0-15 Units into the skin 3 (three) times daily with meals.     letrozole 2.5 MG tablet  Commonly known as:  FEMARA  Take 1  tablet (2.5 mg total) by mouth daily.     levothyroxine 125 MCG tablet  Commonly known as:  SYNTHROID, LEVOTHROID  Take 125 mcg by mouth daily before breakfast.     lidocaine-prilocaine cream  Commonly known as:  EMLA  Apply 1 application topically as needed (for port access).     megestrol 400 MG/10ML suspension  Commonly known as:  MEGACE  Take 10 mLs (400 mg total) by mouth 2 (two) times daily.     metoprolol tartrate 25 MG tablet  Commonly known as:  LOPRESSOR  Take 3 tablets (75 mg total) by mouth 2 (two) times daily.     multivitamin with minerals Tabs  Take 1 tablet by mouth daily.     omeprazole 20 MG capsule  Commonly known as:  PRILOSEC  Take 1 capsule (20 mg total) by mouth 2 (two) times daily.     ondansetron 4 MG disintegrating tablet  Commonly known as:  ZOFRAN ODT  Take 1 tablet (4 mg total) by mouth every 8 (eight) hours as needed for nausea.     oxyCODONE-acetaminophen 5-325 MG per tablet  Commonly known as:  PERCOCET/ROXICET  Take 1-2 tablets by mouth every 4 (four) hours as needed for pain.     potassium chloride 10 MEQ tablet  Commonly known as:  K-DUR  Take 1 tablet (10 mEq total) by mouth daily.     saccharomyces boulardii 250 MG capsule  Commonly known as:  FLORASTOR  Take 1 capsule (250 mg total) by mouth 2 (two) times daily.     sodium chloride 0.9 % injection  10-40 mLs by Intracatheter route as needed (flush).     warfarin 4 MG tablet  Commonly known as:  COUMADIN  Take 1 tablet (4 mg total) by mouth one time only at 6 PM.       Follow-up Information   Follow up with [name]., NP. Schedule an appointment as soon as possible  for a visit in 1 week. (f/u 1-2 weeks)    Contact information:   [address] [place] [address] [phone]       Follow up with Lowella Dell, MD On 09/09/2012. (f/u as scheduled)    Contact information:   [address] [place] [address] [phone]       Follow up  with [name]., MD. Schedule an appointment as soon as possible for a visit in 1 week. (f/u in 1-2 weeks)    Contact information:   [address] [place] [address] [phone]        The results of significant diagnostics from this hospitalization (including imaging, microbiology, ancillary and laboratory) are listed below for reference.    Significant Diagnostic Studies: Dg Chest 2 View  08/24/2012  *RADIOLOGY REPORT*  Clinical Data: Shortness of breath, history of breast cancer  CHEST - 2 VIEW  Comparison: 08/03/2012  Findings: Cardiomediastinal silhouette is stable.  Left subclavian Port-A-Cath is unchanged in position.  There is small right pleural effusion with right basilar atelectasis or infiltrate.  Trace left basilar atelectasis or infiltrate.  Nodular density left base measures about 1 cm.  This may represent nipple shadow.  Repeat frontal view of the chest with nipple markers is recommended.  The patient is status post right mastectomy.  IMPRESSION: Status post right mastectomy. Small right pleural effusion with right basilar atelectasis or infiltrate. Trace left basilar atelectasis or infiltrate.  Nodular density left base may represent nipple shadow.  Repeat frontal view of the chest with nipple markers is recommended.   Original Report Authenticated By: Natasha Mead, M.D.    Dg Abd 1 View  08/27/2012  *RADIOLOGY REPORT*  Clinical Data: Check progression of barium contrast.  ABDOMEN - 1 VIEW  Comparison: Abdominal radiograph performed 08/03/2012  Findings: Ingested barium contrast has progressed into the colon, with contrast seen filling the ascending, descending and sigmoid colon, to the level of the rectum.  Visualized small bowel loops are grossly unremarkable in appearance.  No free intra-abdominal air is identified, though evaluation for free air is suboptimal on a supine views.  Clips are noted within the right upper quadrant, reflecting prior  cholecystectomy.  No acute osseous abnormalities are seen.  IMPRESSION: Progression of ingested barium contrast into the colon, with contrast filling the ascending, descending and sigmoid colon, to the level of the rectum.  Unremarkable bowel gas pattern; no free intra-abdominal air seen.   Original Report Authenticated By: Tonia Ghent, M.D.    Ir Gastrostomy Tube Mod Sed  08/27/2012  *RADIOLOGY REPORT*  Clinical Data:  Breast carcinoma, malnutrition and failure to thrive.  PERCUTANEOUS GASTROSTOMY TUBE PLACEMENT  Sedation:  0.5 mg IV Versed  Total Moderate Sedation Time: 10 minutes.  Contrast:  20 ml Omnipaque-300  Fluoroscopy Time: 2 minutes and 42 seconds.  Procedure:  The procedure, risks, benefits, and alternatives were explained to the patient.  Questions regarding the procedure were encouraged and answered.  The patient understands and consents to the procedure.  The evening prior to the procedure, the patient was given thin liquid barium to ingest in order to opacify the colon.  A 5-French catheter was then advanced through the patient's mouth under fluoroscopy into the esophagus and to the level of the stomach. This catheter was used to insufflate the stomach with air under fluoroscopy.  The abdominal wall was prepped with Betadine in a sterile fashion, and a sterile drape was applied covering the operative  field.  A sterile gown and sterile gloves were used for the procedure. Local anesthesia was provided with 1% Lidocaine.  A skin incision was made in the upper abdominal wall.  Under fluoroscopy, an 18 gauge trocar needle was advanced into the stomach.  Contrast injection was performed to confirm intraluminal position of the needle tip.  A single T tack was then deployed in the lumen of the stomach.  This was brought up to tension at the skin surface.  Over a guidewire, a 9-French sheath was advanced into the lumen of the stomach.  The wire was left in place as a safety wire.  A loop snare device  from a percutaneous gastrostomy kit was then advanced into the stomach.  A floppy guide wire was advanced through the orogastric catheter under fluoroscopy in the stomach.  The loop snare advanced through the percutaneous gastric access was used to snare the guide wire. This allowed withdrawal of the loop snare out of the patient's mouth by retraction of the orogastric catheter and wire.  A 20-French bumper retention gastrostomy tube was looped around the snare device.  It was then pulled back through the patient's mouth. The retention bumper was brought up to the anterior gastric wall. The T tack suture was cut at the skin.  The exiting gastrostomy tube was cut to appropriate length and a feeding adapter applied. The catheter was injected with contrast material to confirm position and a fluoroscopic spot image saved.  The tube was then flushed with saline.  A dressing was applied over the gastrostomy exit site.  Complications:  None  Findings:  Initial fluoroscopy demonstrates adequate opacification of the colon by ingested barium in order to prevent colonic injury during the procedure.  The stomach distended well with air allowing safe placement of the gastrostomy tube.  After placement, the tip of the gastrostomy tube lies in the body of the stomach.  IMPRESSION: Percutaneous gastrostomy with placement of a 20-French bumper retention tube in the body of the stomach.  This tube can be used for percutaneous feeds beginning in 24 hours after placement.   Original Report Authenticated By: Irish Lack, M.D.    Dg Chest Port 1 View  08/26/2012  *RADIOLOGY REPORT*  Clinical Data: Follow up pulmonary edema  PORTABLE CHEST - 1 VIEW  Comparison: Prior chest x-ray 08/25/2012  Findings: Stable position of left subclavian approach portacatheter.  The catheter tip is at the superior cavoatrial junction.  No significant interval change in the appearance of the chest with persistent pulmonary vascular congestion, mild  edema and bibasilar pleural effusions with associated atelectasis.  Surgical changes of prior mastectomy and axillary node dissection are similar.  Stable cardiomegaly.  No pneumothorax.  No acute osseous abnormality.  IMPRESSION: No significant interval change. Stable CHF versus volume overload.   Original Report Authenticated By: Malachy Moan, M.D.    Portable Chest 1 View  08/25/2012  *RADIOLOGY REPORT*  Clinical Data: Atrial fibrillation, pleural effusion.  PORTABLE CHEST - 1 VIEW  Comparison: 08/24/2012 and PET 07/05/2012.  Findings: Trachea is midline.  Heart size stable.  Left subclavian Port-A-Cath tip projects over the SVC.  Mild diffuse bilateral air space disease, bibasilar predominant, with slight worsening from 08/24/2012.  Bilateral pleural effusions. Surgical clips in the right axillary region with changes of right mastectomy.  IMPRESSION: Favor worsening congestive heart failure.   Original Report Authenticated By: Leanna Battles, M.D.     Microbiology: No results found for this or any previous visit (from the  past 240 hour(s)).   Labs: Basic Metabolic Panel:  Recent Labs Lab 09/02/12 0425 09/03/12 0400 09/04/12 0503 09/05/12 0606 09/06/12 0500  NA 137 133* 133* 134* 133*  K 3.5 3.3* 3.5 3.8 4.5  CL 99 96 94* 98 98  CO2 35* 36* 33* 33* 32  GLUCOSE 106* 88 95 96 103*  BUN 14 16 16 15 16   CREATININE 0.58 0.60 0.57 0.56 0.59  CALCIUM 7.5* 7.5* 7.6* 7.6* 7.6*  MG  --  1.8  --   --   --    Liver Function Tests: No results found for this basename: AST, ALT, ALKPHOS, BILITOT, PROT, ALBUMIN,  in the last 168 hours No results found for this basename: LIPASE, AMYLASE,  in the last 168 hours No results found for this basename: AMMONIA,  in the last 168 hours CBC:  Recent Labs Lab 09/02/12 0425 09/03/12 0400 09/04/12 0503 09/05/12 0606 09/06/12 0500  WBC 4.0 5.6 8.9 6.4 9.8  HGB 10.2* 10.1* 10.4* 9.6* 9.7*  HCT 31.2* 31.4* 31.9* 29.7* 30.3*  MCV 85.5 85.3 85.5  85.3 86.1  PLT 130* 157 217 198 233   Cardiac Enzymes: No results found for this basename: CKTOTAL, CKMB, CKMBINDEX, TROPONINI,  in the last 168 hours BNP: BNP (last 3 results)  Recent Labs  08/24/12 1225 08/25/12 0400 08/26/12 0500  PROBNP 1816.0* 1915.0* 1184.0*   CBG:  Recent Labs Lab 09/05/12 1207 09/05/12 1625 09/05/12 2142 09/06/12 0725 09/06/12 1146  GLUCAP 131* 116* 120* 103* 135*       Signed:  Doye Montilla  [facility] 09/06/2012, 1:54 PM

## 2012-09-06 NOTE — Progress Notes (Signed)
Occupational Therapy Treatment Patient Details Name: [name] MRN: [id number] DOB: 08/16/1955 Today's Date: 09/06/2012 Time: 4098-1191 OT Time Calculation (min): 23 min  OT Assessment / Plan / Recommendation    Follow Up Recommendations  Home health OT;Supervision/Assistance - 24 hour             Frequency Min 2X/week   Plan Discharge plan remains appropriate    Precautions / Restrictions Precautions Precautions: Fall Precaution Comments: Monitor HR       ADL  Upper Body Dressing: Performed;Minimal assistance Where Assessed - Upper Body Dressing: Unsupported sitting Toilet Transfer: Performed;Moderate assistance Toilet Transfer Method: Sit to stand Toilet Transfer Equipment: Bedside commode Toileting - Clothing Manipulation and Hygiene: +1 Total assistance Where Assessed - Toileting Clothing Manipulation and Hygiene: Standing Transfers/Ambulation Related to ADLs: Husband very supportive. Pt had had a BM and was not aware.  Clean up was significant.  Explained to husband wearing a brief may help at home if this is something that happens often      OT Goals ADL Goals ADL Goal: Toilet Transfer - Progress: Progressing toward goals ADL Goal: Toileting - Clothing Manipulation - Progress: Progressing toward goals ADL Goal: Toileting - Hygiene - Progress: Progressing toward goals  Visit Information  Last OT Received On: 09/06/12 Assistance Needed: +2    Subjective Data  Subjective: pt agree to OOB/p      Cognition  Cognition Arousal/Alertness: Awake/alert Behavior During Therapy: WFL for tasks assessed/performed General Comments: decreased insight into need for assist, pt reports she will be better once home however states need for assist to stand today    Mobility  Bed Mobility Bed Mobility: Supine to Sit Supine to Sit: 2: Max assist Sitting - Scoot to Edge of Bed: 2: Max assist Details for Bed Mobility Assistance: EOB with OT upon  entering Transfers Transfers: Sit to Stand;Stand to Sit Sit to Stand: 1: +2 Total assist;From bed;With upper extremity assist Sit to Stand: Patient Percentage: 70% Stand to Sit: 1: +2 Total assist Stand to Sit: Patient Percentage: 70% Details for Transfer Assistance: max verbal cues for safe technique as pt would follow cue then grab RW, also verbal cue for forward lean and unweighting hips, assist to rise and stabilize and control descent, pt had BM upon standing so assisted to [facility] then called nsg tech for clean up/RN to change dressings          End of Session OT - End of Session Activity Tolerance: Patient tolerated treatment well Patient left: Other (comment) (on commode with CNA and family)  GO     Alba Cory 09/06/2012, 11:25 AM

## 2012-09-07 ENCOUNTER — Encounter (HOSPITAL_COMMUNITY): Payer: Self-pay | Admitting: *Deleted

## 2012-09-07 ENCOUNTER — Ambulatory Visit: Payer: 59

## 2012-09-07 ENCOUNTER — Other Ambulatory Visit: Payer: 59 | Admitting: Lab

## 2012-09-07 ENCOUNTER — Telehealth: Payer: Self-pay | Admitting: *Deleted

## 2012-09-07 ENCOUNTER — Inpatient Hospital Stay (HOSPITAL_COMMUNITY)
Admission: EM | Admit: 2012-09-07 | Discharge: 2012-09-13 | DRG: 391 | Disposition: A | Discharge status: Rehabilitation Facility | Payer: 59 | Attending: Oncology | Admitting: Oncology

## 2012-09-07 ENCOUNTER — Ambulatory Visit: Payer: 59 | Admitting: Oncology

## 2012-09-07 DIAGNOSIS — F430 Acute stress reaction: Secondary | ICD-10-CM

## 2012-09-07 DIAGNOSIS — R42 Dizziness and giddiness: Secondary | ICD-10-CM

## 2012-09-07 DIAGNOSIS — D509 Iron deficiency anemia, unspecified: Secondary | ICD-10-CM

## 2012-09-07 DIAGNOSIS — E43 Unspecified severe protein-calorie malnutrition: Secondary | ICD-10-CM | POA: Diagnosis present

## 2012-09-07 DIAGNOSIS — R601 Generalized edema: Secondary | ICD-10-CM

## 2012-09-07 DIAGNOSIS — Z6837 Body mass index (BMI) 37.0-37.9, adult: Secondary | ICD-10-CM

## 2012-09-07 DIAGNOSIS — R531 Weakness: Secondary | ICD-10-CM

## 2012-09-07 DIAGNOSIS — I509 Heart failure, unspecified: Secondary | ICD-10-CM | POA: Diagnosis present

## 2012-09-07 DIAGNOSIS — I5032 Chronic diastolic (congestive) heart failure: Secondary | ICD-10-CM | POA: Diagnosis present

## 2012-09-07 DIAGNOSIS — E871 Hypo-osmolality and hyponatremia: Secondary | ICD-10-CM

## 2012-09-07 DIAGNOSIS — R110 Nausea: Secondary | ICD-10-CM | POA: Diagnosis present

## 2012-09-07 DIAGNOSIS — K769 Liver disease, unspecified: Secondary | ICD-10-CM | POA: Diagnosis present

## 2012-09-07 DIAGNOSIS — E785 Hyperlipidemia, unspecified: Secondary | ICD-10-CM | POA: Diagnosis present

## 2012-09-07 DIAGNOSIS — Z9221 Personal history of antineoplastic chemotherapy: Secondary | ICD-10-CM

## 2012-09-07 DIAGNOSIS — G8929 Other chronic pain: Secondary | ICD-10-CM | POA: Diagnosis present

## 2012-09-07 DIAGNOSIS — E119 Type 2 diabetes mellitus without complications: Secondary | ICD-10-CM | POA: Diagnosis present

## 2012-09-07 DIAGNOSIS — M545 Low back pain, unspecified: Secondary | ICD-10-CM | POA: Diagnosis present

## 2012-09-07 DIAGNOSIS — C50911 Malignant neoplasm of unspecified site of right female breast: Secondary | ICD-10-CM

## 2012-09-07 DIAGNOSIS — E46 Unspecified protein-calorie malnutrition: Secondary | ICD-10-CM

## 2012-09-07 DIAGNOSIS — R5381 Other malaise: Secondary | ICD-10-CM | POA: Diagnosis present

## 2012-09-07 DIAGNOSIS — Z9010 Acquired absence of unspecified breast and nipple: Secondary | ICD-10-CM

## 2012-09-07 DIAGNOSIS — G4733 Obstructive sleep apnea (adult) (pediatric): Secondary | ICD-10-CM | POA: Diagnosis present

## 2012-09-07 DIAGNOSIS — F3289 Other specified depressive episodes: Secondary | ICD-10-CM | POA: Diagnosis present

## 2012-09-07 DIAGNOSIS — Z79899 Other long term (current) drug therapy: Secondary | ICD-10-CM

## 2012-09-07 DIAGNOSIS — R609 Edema, unspecified: Secondary | ICD-10-CM | POA: Diagnosis present

## 2012-09-07 DIAGNOSIS — Z87891 Personal history of nicotine dependence: Secondary | ICD-10-CM

## 2012-09-07 DIAGNOSIS — F411 Generalized anxiety disorder: Secondary | ICD-10-CM | POA: Diagnosis present

## 2012-09-07 DIAGNOSIS — I10 Essential (primary) hypertension: Secondary | ICD-10-CM | POA: Diagnosis present

## 2012-09-07 DIAGNOSIS — Z515 Encounter for palliative care: Secondary | ICD-10-CM

## 2012-09-07 DIAGNOSIS — F329 Major depressive disorder, single episode, unspecified: Secondary | ICD-10-CM

## 2012-09-07 DIAGNOSIS — Z931 Gastrostomy status: Secondary | ICD-10-CM

## 2012-09-07 DIAGNOSIS — K219 Gastro-esophageal reflux disease without esophagitis: Secondary | ICD-10-CM | POA: Diagnosis present

## 2012-09-07 DIAGNOSIS — R197 Diarrhea, unspecified: Principal | ICD-10-CM | POA: Diagnosis present

## 2012-09-07 DIAGNOSIS — E860 Dehydration: Secondary | ICD-10-CM | POA: Diagnosis present

## 2012-09-07 DIAGNOSIS — C50919 Malignant neoplasm of unspecified site of unspecified female breast: Secondary | ICD-10-CM | POA: Diagnosis present

## 2012-09-07 DIAGNOSIS — Z66 Do not resuscitate: Secondary | ICD-10-CM | POA: Diagnosis present

## 2012-09-07 DIAGNOSIS — Z86718 Personal history of other venous thrombosis and embolism: Secondary | ICD-10-CM

## 2012-09-07 DIAGNOSIS — E039 Hypothyroidism, unspecified: Secondary | ICD-10-CM | POA: Diagnosis present

## 2012-09-07 DIAGNOSIS — I4891 Unspecified atrial fibrillation: Secondary | ICD-10-CM | POA: Diagnosis present

## 2012-09-07 HISTORY — DX: Spondylosis without myelopathy or radiculopathy, lumbar region: M47.816

## 2012-09-07 LAB — COMPREHENSIVE METABOLIC PANEL
ALT: 27 U/L (ref 0–35)
Albumin: 1.6 g/dL — ABNORMAL LOW (ref 3.5–5.2)
Calcium: 8 mg/dL — ABNORMAL LOW (ref 8.4–10.5)
GFR calc Af Amer: >90 mL/min (ref >90)
Glucose, Bld: 108 mg/dL — ABNORMAL HIGH (ref 70–99)
Sodium: 130 mEq/L — ABNORMAL LOW (ref 135–145)
Total Protein: 5.7 g/dL — ABNORMAL LOW (ref 6.0–8.3)

## 2012-09-07 LAB — CBC WITH DIFFERENTIAL/PLATELET
Basophils Absolute: 0 10*3/uL (ref 0.0–0.1)
Basophils Relative: 0 % (ref 0–1)
Eosinophils Absolute: 0 10*3/uL (ref 0.0–0.7)
Lymphocytes Relative: 15 % (ref 12–46)
MCH: 27.4 pg (ref 26.0–34.0)
MCHC: 31.9 g/dL (ref 30.0–36.0)
Monocytes Absolute: 1.1 10*3/uL — ABNORMAL HIGH (ref 0.1–1.0)
Neutrophils Relative %: 76 % (ref 43–77)
Platelets: 249 10*3/uL (ref 150–400)
RDW: 26.1 % — ABNORMAL HIGH (ref 11.5–15.5)

## 2012-09-07 LAB — GLUCOSE, CAPILLARY: Glucose-Capillary: 89 mg/dL (ref 70–99)

## 2012-09-07 LAB — PROTIME-INR
INR: 3.86 — ABNORMAL HIGH (ref 0.00–1.49)
Prothrombin Time: 35.6 seconds — ABNORMAL HIGH (ref 11.6–15.2)

## 2012-09-07 MED ORDER — ASPIRIN EC 81 MG PO TBEC
81.0000 mg | DELAYED_RELEASE_TABLET | Freq: Every morning | ORAL | Status: DC
  Administered 2012-09-09 – 2012-09-13 (×5): 81 mg via ORAL
  Filled 2012-09-07 (×7): qty 1

## 2012-09-07 MED ORDER — CHOLESTYRAMINE 4 G PO PACK
4.0000 g | PACK | Freq: Three times a day (TID) | ORAL | Status: DC
  Filled 2012-09-07 (×10): qty 1

## 2012-09-07 MED ORDER — SODIUM CHLORIDE 0.9 % IJ SOLN: 3.0000 mL | INTRAMUSCULAR | Status: DC | PRN

## 2012-09-07 MED ORDER — METOPROLOL TARTRATE 50 MG PO TABS
75.0000 mg | ORAL_TABLET | Freq: Two times a day (BID) | ORAL | Status: DC
  Administered 2012-09-09 – 2012-09-13 (×9): 75 mg via ORAL
  Filled 2012-09-07 (×14): qty 1

## 2012-09-07 MED ORDER — LEVOTHYROXINE SODIUM 125 MCG PO TABS
125.0000 ug | ORAL_TABLET | Freq: Every day | ORAL | Status: DC
  Administered 2012-09-09 – 2012-09-13 (×5): 125 ug via ORAL
  Filled 2012-09-07 (×7): qty 1

## 2012-09-07 MED ORDER — WARFARIN - PHARMACIST DOSING INPATIENT: Freq: Every day | Status: DC

## 2012-09-07 MED ORDER — SODIUM CHLORIDE 0.9 % IV SOLN: INTRAVENOUS | Status: DC

## 2012-09-07 MED ORDER — SODIUM CHLORIDE 0.9 % IV SOLN
250.0000 mL | INTRAVENOUS | Status: DC | PRN
  Administered 2012-09-09: 250 mL via INTRAVENOUS

## 2012-09-07 MED ORDER — SODIUM CHLORIDE 0.9 % IV SOLN
Freq: Once | INTRAVENOUS | Status: AC
  Administered 2012-09-07: 11:00:00 via INTRAVENOUS

## 2012-09-07 MED ORDER — MORPHINE SULFATE 2 MG/ML IJ SOLN
1.0000 mg | Freq: Four times a day (QID) | INTRAMUSCULAR | Status: DC
  Administered 2012-09-07 – 2012-09-12 (×7): 1 mg via INTRAVENOUS
  Filled 2012-09-07 (×6): qty 1

## 2012-09-07 MED ORDER — SODIUM CHLORIDE 0.9 % IV SOLN: 250.0000 mL | INTRAVENOUS | Status: DC | PRN

## 2012-09-07 MED ORDER — ATORVASTATIN CALCIUM 10 MG PO TABS
10.0000 mg | ORAL_TABLET | Freq: Every day | ORAL | Status: DC
  Filled 2012-09-07 (×3): qty 1

## 2012-09-07 MED ORDER — SODIUM CHLORIDE 0.9 % IJ SOLN
3.0000 mL | Freq: Two times a day (BID) | INTRAMUSCULAR | Status: DC
Start: 2012-09-07 — End: 2012-09-13
  Administered 2012-09-11 – 2012-09-13 (×3): 3 mL via INTRAVENOUS

## 2012-09-07 MED ORDER — PROMETHAZINE HCL 25 MG/ML IJ SOLN
12.5000 mg | Freq: Four times a day (QID) | INTRAMUSCULAR | Status: DC
  Administered 2012-09-08 – 2012-09-12 (×6): 12.5 mg via INTRAVENOUS
  Filled 2012-09-07 (×27): qty 1

## 2012-09-07 MED ORDER — ONDANSETRON 8 MG/NS 50 ML IVPB
8.0000 mg | Freq: Three times a day (TID) | INTRAVENOUS | Status: DC
  Administered 2012-09-08: 8 mg via INTRAVENOUS
  Filled 2012-09-07 (×9): qty 8

## 2012-09-07 MED ORDER — FUROSEMIDE 80 MG PO TABS
80.0000 mg | ORAL_TABLET | Freq: Two times a day (BID) | ORAL | Status: DC
  Filled 2012-09-07 (×2): qty 1

## 2012-09-07 MED ORDER — SODIUM CHLORIDE 0.9 % IJ SOLN
10.0000 mL | Freq: Two times a day (BID) | INTRAMUSCULAR | Status: DC
Start: 2012-09-07 — End: 2012-09-13
  Administered 2012-09-11 – 2012-09-13 (×4): 10 mL

## 2012-09-07 MED ORDER — BUPROPION HCL ER (SR) 150 MG PO TB12
150.0000 mg | ORAL_TABLET | Freq: Every morning | ORAL | Status: DC
  Administered 2012-09-09 – 2012-09-13 (×5): 150 mg via ORAL
  Filled 2012-09-07 (×7): qty 1

## 2012-09-07 MED ORDER — MORPHINE SULFATE 4 MG/ML IJ SOLN
2.0000 mg | Freq: Once | INTRAMUSCULAR | Status: AC
  Administered 2012-09-07: 14:00:00 via INTRAVENOUS
  Filled 2012-09-07: qty 1

## 2012-09-07 MED ORDER — MORPHINE SULFATE 2 MG/ML IJ SOLN
2.0000 mg | INTRAMUSCULAR | Status: DC | PRN
  Filled 2012-09-07: qty 1

## 2012-09-07 MED ORDER — INSULIN ASPART 100 UNIT/ML ~~LOC~~ SOLN: 0.0000 [IU] | Freq: Three times a day (TID) | SUBCUTANEOUS | Status: DC

## 2012-09-07 MED ORDER — ONDANSETRON HCL 4 MG/2ML IJ SOLN: 4.0000 mg | Freq: Three times a day (TID) | INTRAMUSCULAR | Status: DC | PRN

## 2012-09-07 MED ORDER — LETROZOLE 2.5 MG PO TABS
2.5000 mg | ORAL_TABLET | Freq: Every day | ORAL | Status: DC
  Administered 2012-09-09 – 2012-09-13 (×5): 2.5 mg via ORAL
  Filled 2012-09-07 (×7): qty 1

## 2012-09-07 MED ORDER — POTASSIUM CHLORIDE ER 10 MEQ PO TBCR
10.0000 meq | EXTENDED_RELEASE_TABLET | Freq: Every day | ORAL | Status: DC
  Administered 2012-09-09 – 2012-09-12 (×4): 10 meq via ORAL
  Filled 2012-09-07 (×7): qty 1

## 2012-09-07 MED ORDER — BIOTENE DRY MOUTH MT LIQD: 15.0000 mL | Freq: Two times a day (BID) | OROMUCOSAL | Status: DC | PRN

## 2012-09-07 MED ORDER — AMIODARONE HCL 200 MG PO TABS
400.0000 mg | ORAL_TABLET | Freq: Two times a day (BID) | ORAL | Status: DC
  Filled 2012-09-07 (×6): qty 2

## 2012-09-07 MED ORDER — LOPERAMIDE HCL 2 MG PO CAPS
2.0000 mg | ORAL_CAPSULE | Freq: Four times a day (QID) | ORAL | Status: DC
  Administered 2012-09-07 – 2012-09-08 (×2): 2 mg via ORAL
  Filled 2012-09-07 (×15): qty 1

## 2012-09-07 MED ORDER — MEGESTROL ACETATE 400 MG/10ML PO SUSP
400.0000 mg | Freq: Two times a day (BID) | ORAL | Status: DC
  Filled 2012-09-07 (×6): qty 10

## 2012-09-07 MED ORDER — FENTANYL CITRATE 0.05 MG/ML IJ SOLN
50.0000 ug | Freq: Once | INTRAMUSCULAR | Status: AC
  Administered 2012-09-07: 50 ug via INTRAVENOUS
  Filled 2012-09-07: qty 2

## 2012-09-07 MED ORDER — SODIUM CHLORIDE 0.9 % IJ SOLN
10.0000 mL | INTRAMUSCULAR | Status: DC | PRN
  Administered 2012-09-07 – 2012-09-08 (×2): 10 mL

## 2012-09-07 MED ORDER — ESCITALOPRAM OXALATE 10 MG PO TABS
10.0000 mg | ORAL_TABLET | Freq: Every day | ORAL | Status: DC
  Filled 2012-09-07 (×2): qty 1

## 2012-09-07 NOTE — ED Notes (Signed)
Pt from home, brought in by PTAR whom is also pt's son-in-law with reports of continued diarrhea and weakness. Pt reports that she would like to speak to a Child psychotherapist and is requesting Hospice. Pt reports that she is tired of being so sick and being in the hospital all the time and "I am ready to go". Pt refused rectal temp but allowed Liberty Handy, RN to access porta cath and draw blood. Pt also requesting to have feeding tube removed and to be made a DNR.

## 2012-09-07 NOTE — Progress Notes (Signed)
Thank you for consulting the Palliative Medicine Team at [facility] to meet your patient's and family's needs.  The reason that you asked [name] to see your patient is for Goals of Care. We have scheduled your patient for a meeting Wednesday 4/30 @ 8am. The Surrogate decision maker is patient's husband [name] -  Contact information: Ellamarie Naeve husband [phone]  Other family members that need to be present - daughter Carollee Herter will be present. Your patient is able to participate in the meeting - patient was alert and asked to be spoken to when this RN was attempting to made the appointment with her husband.   Additional Narrative: Adm 4/29 (dc'd 4/28): re-admitted with worsening weakness, pain 10/10, BLE edema. Pt states she does not want to continue treatments, living like this.  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

## 2012-09-07 NOTE — Progress Notes (Signed)
Late entry for 09/07/12 1245 CM consulted by ED SW about plan of care for pt   1512 CM spoke with Dr Sharl Ma about admission Dr Sharl Ma to clarify order

## 2012-09-07 NOTE — Progress Notes (Signed)
The patient has been saying clearly that she does not want treatment that will prolong her life. She told me she has "suffered enough". She talked about how she cannot walk or eat and has little quality of life. She is ready to just let nature take its course. She has been talking about being able to go on to [facility] to be with God and be at peace. She said that her family "will not let" her go in front of them. Her husband and daughter were in the room during these discussions. Her daughter told me that she is doesn't want to "say good bye," but she is ready to let her mother go. She said, "She has suffered enough." Her spouse [name] is resisting the thought of her dying and began crying. He said, "I can't go on without her. I want to die with her. I can't let her go." She would try to reason with him but he is not hearing her right now. Chaplains will continue to support [name] and her family--particularly [name].

## 2012-09-07 NOTE — H&P (Addendum)
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 Patient had recently peg tube placed. She says she does not want to continue living like this and wants to stop all the treatments. She is exploring the options for hospice. She denies chest pain, shortness of breath, but admits to having back pain.which she says is 10/10 in intensity. She also has worsening of leg swelling. She was seen by cardiology recently and started on amiodarone and coumadin.   Allergies:   Allergies  Allergen Reactions  . Olmesartan Medoxomil Cough    REACTION: ? if cough  . Tetracycline Hcl     Unknown reaction, too long for patient to remember   . Venlafaxine     REACTION: severe dry moouth  . Adhesive (Tape) Rash      Past Medical History  Diagnosis Date  . Depression   . DVT (deep venous thrombosis)     hx of on HRT left leg ~2006  . GERD (gastroesophageal reflux disease)   . Hyperlipidemia   . Hypertension   . Hypothyroidism   . PPD positive, treated     rx inh   . OSA on CPAP   . Diabetes mellitus   . 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 Breast cancer     a. 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 Tubal ligation    . Cholecystectomy    . Foot surgery    . Eye surgery    . Cataract extraction    .  Percutaneous liver biopsy    . Breast biopsy      left breast  . Mastectomy modified radical  06/08/2012    Procedure: MASTECTOMY MODIFIED RADICAL;  Surgeon: Mariella Saa, MD;  Location: [facility] OR;  Service: General;  Laterality: Right;  . Portacath placement  06/08/2012    Procedure: INSERTION PORT-A-CATH;  Surgeon: Mariella Saa, MD;  Location: [facility] OR;  Service: General;  Laterality: Left;  . Peg tube placement      Prior to Admission medications   Medication Sig Start Date End Date Taking? Authorizing Provider  amiodarone (PACERONE) 200 MG tablet Take 2 tablets (400 mg total) by mouth 2 (two) times daily. Take 2 tablets (400mg ) 2 times daily x 4 days, then 1 tablet (200mg ) daily 09/06/12  Yes Rodolph Bong, MD  antiseptic oral rinse (BIOTENE) LIQD 15 mLs by Mouth Rinse route 2 (two) times daily as needed. 08/17/12  Yes Lowella Dell, MD  aspirin EC 81 MG tablet Take 81 mg by mouth every morning.   Yes Historical Provider, MD  atorvastatin (LIPITOR) 10 MG tablet Take 10 mg by mouth at bedtime.  10/14/11  Yes Madelin Headings, MD  buPROPion (WELLBUTRIN SR) 150 MG 12 hr tablet Take 150 mg by mouth every morning.    Yes Historical Provider, MD  cholestyramine Lanetta Inch) 4 G  packet Take 1 packet (4 g total) by mouth 2 (two) times daily. 08/17/12  Yes Lowella Dell, MD  escitalopram (LEXAPRO) 10 MG tablet Take 1 tablet (10 mg total) by mouth at bedtime. 09/06/12  Yes Rodolph Bong, MD  feeding supplement (PRO-STAT SUGAR FREE 64) LIQD Place 30 mLs into feeding tube 3 (three) times daily with meals. 09/06/12  Yes Rodolph Bong, MD  feeding supplement (RESOURCE BREEZE) LIQD Take 1 Container by mouth 2 (two) times daily between meals. 08/17/12  Yes Lowella Dell, MD  furosemide (LASIX) 80 MG tablet Take 1 tablet (80 mg total) by mouth 2 (two) times daily. 09/06/12  Yes Rodolph Bong, MD  insulin aspart (NOVOLOG) 100 UNIT/ML injection Inject 0-15 Units into the skin 3 (three) times  daily with meals. 08/17/12  Yes Lowella Dell, MD  letrozole [facility]) 2.5 MG tablet Take 1 tablet (2.5 mg total) by mouth daily. 08/17/12  Yes Lowella Dell, MD  levothyroxine (SYNTHROID, LEVOTHROID) 125 MCG tablet Take 125 mcg by mouth daily before breakfast. 03/31/12  Yes Madelin Headings, MD  lidocaine-prilocaine (EMLA) cream Apply 1 application topically as needed (for port access). 07/01/12  Yes Amy Allegra Grana, PA-C  megestrol (MEGACE) 400 MG/10ML suspension Take 10 mLs (400 mg total) by mouth 2 (two) times daily. 09/06/12  Yes Rodolph Bong, MD  metoprolol tartrate (LOPRESSOR) 25 MG tablet Take 3 tablets (75 mg total) by mouth 2 (two) times daily. 09/06/12  Yes Rodolph Bong, MD  Multiple Vitamin (MULTIVITAMIN WITH MINERALS) TABS Take 1 tablet by mouth daily. 08/17/12  Yes Lowella Dell, MD  Nutritional Supplements (FEEDING SUPPLEMENT, JEVITY 1.2 CAL,) LIQD Place 360 mLs into feeding tube 4 (four) times daily. 09/06/12  Yes Rodolph Bong, MD  omeprazole (PRILOSEC) 20 MG capsule Take 1 capsule (20 mg total) by mouth 2 (two) times daily. 11/18/11  Yes Madelin Headings, MD  ondansetron (ZOFRAN ODT) 4 MG disintegrating tablet Take 1 tablet (4 mg total) by mouth every 8 (eight) hours as needed for nausea. 08/01/12  Yes Gerhard Munch, MD  oxyCODONE-acetaminophen (PERCOCET/ROXICET) 5-325 MG per tablet Take 1-2 tablets by mouth every 4 (four) hours as needed for pain. 08/02/12  Yes Raeford Razor, MD  potassium chloride (K-DUR) 10 MEQ tablet Take 1 tablet (10 mEq total) by mouth daily. 09/06/12  Yes Rodolph Bong, MD  saccharomyces boulardii (FLORASTOR) 250 MG capsule Take 1 capsule (250 mg total) by mouth 2 (two) times daily. 07/21/12  Yes Leroy Sea, MD  sodium chloride 0.9 % injection 10-40 mLs by Intracatheter route as needed (flush). 08/17/12  Yes Lowella Dell, MD  warfarin (COUMADIN) 4 MG tablet Take 1 tablet (4 mg total) by mouth one time only at 6 PM. 09/06/12  Yes Rodolph Bong, MD  Water For Irrigation, Sterile (FREE WATER) SOLN Place 160 mLs into feeding tube 4 (four) times daily - after meals and at bedtime. 09/06/12  Yes Rodolph Bong, MD    Social History:  reports that she has quit smoking. She has never used smokeless tobacco. She reports that she does not drink alcohol or use illicit drugs.  Family History  Problem Relation Age of Onset  . Diabetes Mother   . Hypertension Mother   . Arthritis Mother   . Heart disease Mother   . Heart failure Mother   . Other Mother     benign breast mass  . Stroke Father   .  Heart disease Father   . Diabetes Paternal Grandmother   . Colon cancer Paternal Grandfather     Review of Systems:  HEENT: Denies headache, blurred vision, runny nose, sore throat,  Neck: Denies thyroid problems,lymphadenopathy Chest : Denies shortness of breath, no history of COPD Heart : Denies Chest pain,  coronary arterey disease GI: Positive diarrhea GU: Denies dysuria, urgency, frequency of urination, hematuria Neuro: Denies stroke, seizures, syncope Psych: Denies depression, no suicidal thoughts   Physical Exam: Blood pressure 116/61, pulse 65, temperature 97.4 F (36.3 C), temperature source Oral, resp. rate 16, SpO2 97.00%. Constitutional:   Patient is a well-developed and well-nourished female * in no acute distress and cooperative with exam. Head: Normocephalic and atraumatic Mouth: Mucus membranes moist Eyes: PERRL, EOMI, conjunctivae normal Neck: Supple, No Thyromegaly Cardiovascular: RRR, S1 normal, S2 normal Pulmonary/Chest: CTAB, no wheezes, rales, or rhonchi Abdominal: Soft. Non-tender, non-distended, bowel sounds are normal, no masses, organomegaly, or guarding present.  Neurological: A&O x3, Strenght is normal and symmetric bilaterally, cranial nerve II-XII are grossly intact, no focal motor deficit, sensory intact to light touch bilaterally.  Extremities :Bilateral 2+ edema of the lower  extremities   Labs on Admission:  Results for orders placed during the hospital encounter of 09/07/12 (from the past 48 hour(s))  CBC WITH DIFFERENTIAL     Status: Abnormal   Collection Time    09/07/12 10:20 AM      Result Value Range   WBC 11.9 (*) 4.0 - 10.5 K/uL   RBC 3.51 (*) 3.87 - 5.11 MIL/uL   Hemoglobin 9.6 (*) 12.0 - 15.0 g/dL   HCT 54.0 (*) 98.1 - 19.1 %   MCV 85.8  78.0 - 100.0 fL   MCH 27.4  26.0 - 34.0 pg   MCHC 31.9  30.0 - 36.0 g/dL   RDW 47.8 (*) 29.5 - 62.1 %   Platelets 249  150 - 400 K/uL   Neutrophils Relative 76  43 - 77 %   Lymphocytes Relative 15  12 - 46 %   Monocytes Relative 9  3 - 12 %   Eosinophils Relative 0  0 - 5 %   Basophils Relative 0  0 - 1 %   Neutro Abs 9.0 (*) 1.7 - 7.7 K/uL   Lymphs Abs 1.8  0.7 - 4.0 K/uL   Monocytes Absolute 1.1 (*) 0.1 - 1.0 K/uL   Eosinophils Absolute 0.0  0.0 - 0.7 K/uL   Basophils Absolute 0.0  0.0 - 0.1 K/uL   RBC Morphology POLYCHROMASIA PRESENT    COMPREHENSIVE METABOLIC PANEL     Status: Abnormal   Collection Time    09/07/12 10:20 AM      Result Value Range   Sodium 130 (*) 135 - 145 mEq/L   Potassium 4.9  3.5 - 5.1 mEq/L   Chloride 94 (*) 96 - 112 mEq/L   CO2 28  19 - 32 mEq/L   Glucose, Bld 108 (*) 70 - 99 mg/dL   BUN 27 (*) 6 - 23 mg/dL   Comment: REPEATED TO VERIFY     DELTA CHECK NOTED   Creatinine, Ser 0.76  0.50 - 1.10 mg/dL   Calcium 8.0 (*) 8.4 - 10.5 mg/dL   Total Protein 5.7 (*) 6.0 - 8.3 g/dL   Albumin 1.6 (*) 3.5 - 5.2 g/dL   AST 40 (*) 0 - 37 U/L   ALT 27  0 - 35 U/L   Alkaline Phosphatase 213 (*) 39 - 117 U/L  Total Bilirubin 0.3  0.3 - 1.2 mg/dL   GFR calc non Af Amer >90  >90 mL/min   GFR calc Af Amer >90  >90 mL/min   Comment:            The eGFR has been calculated     using the CKD EPI equation.     This calculation has not been     validated in all clinical     situations.     eGFR's persistently     <90 mL/min signify     possible Chronic Kidney Disease.     Radiological Exams on Admission: No results found.  Assessment/Plan Breast cancer Malnutrition s/p peg tube placement Chronic diastolic CHF Anasarca Chronic pain Diabetes mellitus Atrial fibrillation  Breast cancer She has Pathologic staging PET 3, P. and 1, stage IIIa. ER and PR positive at 100%, HER -2/neu negative Called and discussed with Dr Russ Halo, who says that she is not dying from breast cancer. He will come and talk to the patient tonight.  Malnutrition Patient is s/p peg tube and does not want to be fed through the tube. Will not start the tube feedings until the goals of care meeting with palliative care.  Anasarca Will continue with po lasix  Chronic pain Will start with IV morphine 2 mg IV q 4 hr prn  Atrial fibrillation Will continue with amiodarone and coumadin per pharnmacy  Diabetes Mellitus Will start the patient on sliding scale insulin.  Diarrhea Patient says she has diarrhea during hospital stay and now it has become worse, will obtain c diff PCR.  Goals of care: patient does not want to pursue with active treatments and wants to stop all the life prolonging measures, she wants to be DNR. Family is aware of her decision, will get palliative  Care consult for detailed goals of care. Oncology has been informed of this decision and Dr Russ Halo supports palliative car consult. Will continue the home medications till the meeting with palliative care.  Time Spent on Admission: 75 min  LAMA,GAGAN S [facility] Pager: [phone] 09/07/2012, 1:55 PM

## 2012-09-07 NOTE — Telephone Encounter (Signed)
Spoke to patients husband and he states that home health nurse is on her way to the house right now. He says that patient is having diarrhea and is very weak. He says that patient may have to go back into the hospital. He will call later to schedule follow up.

## 2012-09-07 NOTE — ED Notes (Signed)
[id number]<AV> Expected date:<BR> Expected time:<BR> Means of arrival:<BR> Comments:<BR> 57 y/o F weakness/breast CA

## 2012-09-07 NOTE — Telephone Encounter (Signed)
Received message from pt's daughter, [name] stating pt has been taken back to the hospital due to weakness, ?dehydration, inability to tolerate chemotherapy. Reports that pt has told her family she is ready to meet the Shaune Pollack and is tired of the pain and suffering. Pt is wanting feeding tube removed. [name] states pt is signing a DNR today and the hospital is going to call in Hospice. She wanted [name] to be aware of current situation.

## 2012-09-07 NOTE — Progress Notes (Addendum)
Small amt of bright red blood noted on pad.  Dr [name] informed. Pt refusing to take po meds, refuses to eat, and states that she just wants to die.  Family at bedside.  Palliative nurse has been by to see pt.

## 2012-09-07 NOTE — Progress Notes (Signed)
Clinical Social Work Department BRIEF PSYCHOSOCIAL ASSESSMENT 09/07/2012  Patient:  [name], [name]     Account Number:  [id number]     Admit date:  09/07/2012  Clinical Social Worker:  Doree Albee  Date/Time:  09/07/2012 02:30 PM  Referred by:  RN  Date Referred:  09/07/2012 Referred for  Advanced Directives  Residential hospice placement   Other Referral:   Interview type:  Patient Other interview type:    PSYCHOSOCIAL DATA Living Status:  FAMILY Admitted from facility:   Level of care:   Primary support name:  Mare Ferrari Primary support relationship to patient:  SPOUSE Degree of support available:   patient daughters    CURRENT CONCERNS Current Concerns  Post-Acute Placement   Other Concerns:    SOCIAL WORK ASSESSMENT / PLAN CSW received referral for advance directives. CSW met with pt at bedside and spoke privately with patient. CSW introduced self and csw role. Patient shared that she would like to change her HCPOA as patient feels that her husband is not capable of making the best decisions for patient. Patient is alert and oriented x4. Patient shared that she has been treated for cancer since January and is wanting to discontinue treatment. Pt was discharged yesterday home with home health and is now desiring to be a DNR and feels that her husband is unable to respect her wishes. Patient wishes to make her daughters HCPOA for when patient is unable to make her own decisions. Pt wishes for patient family to go to the waiting room so patient could complete hcpoa paper work.    CSW discussed patient case with attending who is ordering a pallaitve care consult to better determine pt wishes and plan of care at this time. CSW provided pt with hcpoa paper work and assisted with obtaining notary services from a csw colleage.    Pt does not wish to tell patient husband at this time that she would prefer her daughters have hcpoa at this time. When patient husband was  asked to step out of the room and to the waiting room he continued to ask this writer to talk to the patient into keeping her feeding tube and to not give up. CSW explained to patient that at this time patient can make her own decisions and we can not force the patient into any plan of care however the Palliative team will be able to discuss with patient and family if she wishes on plan of care and that no decisions had been made at this time. Pt husband thanked csw for concerna nd support.    Pt asked CSW to give completed HCPOA to patient daughter. CSW was able to give patient daught HCPOA paperwork as patient husband was with patient.    Pt daughter expressed gratitude for the help from all the staff. Patient daughghter shared she has been the primary care giver including changing and cleaning the patient. Pt daughter shared that it is hard to hear your mother say she has tried hard enough and wants to die. CSW provided emotional support to pt duaghter. Pt daughter stated patient has made comments that she prefers to be burried beside her 1st husband who has already passed away and that pt husband has had difficulty with talking about death more so than other family members. Pt duaghter Maxine Glenn is patient current husbands daughter with patient. Patient other daughter is a daughter from previous marriage.    Pt lives at home with pt spouse, pt daughter, pt son  in law, and pt two young grandchildren ages 35 and 2.   Assessment/plan status:  Psychosocial Support/Ongoing Assessment of Needs Other assessment/ plan:   Information/referral to community resources:   none identifed at this time, pending palliative care consult    PATIENT'S/FAMILY'S RESPONSE TO PLAN OF CARE: Patient and patient family thanked csw for concern and support. Patient thanked csw for providing hcpoa paperwork and notary. Patient family thanked csw for emotional support. Patient and pt family interested to further contact from cancer  center social workers and unit Child psychotherapist.     Catha Gosselin, LCSWA  [phone] 09/07/2012 1430pm
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
## 2012-09-07 NOTE — Progress Notes (Signed)
ANTICOAGULATION CONSULT NOTE - Initial Consult  Pharmacy Consult for Warfarin Indication: atrial fibrillation  Allergies  Allergen Reactions  . Olmesartan Medoxomil Cough    REACTION: ? if cough  . Tetracycline Hcl     Unknown reaction, too long for patient to remember   . Venlafaxine     REACTION: severe dry moouth  . Adhesive (Tape) Rash    Patient Measurements: Weight = 97.6 kg (4/28) Height = 64 inches  Vital Signs: Temp: 97.5 F (36.4 C) (04/29 1522) Temp src: Oral (04/29 1522) BP: 106/58 mmHg (04/29 1522) Pulse Rate: 66 (04/29 1522)  Labs:  Recent Labs  09/05/12 0606 09/06/12 0500 09/07/12 1020 09/07/12 1640  HGB 9.6* 9.7* 9.6*  --   HCT 29.7* 30.3* 30.1*  --   PLT 198 233 249  --   LABPROT 21.3* 23.4*  --  35.6*  INR 1.93* 2.19*  --  3.86*  HEPARINUNFRC 0.41 0.46  --   --   CREATININE 0.56 0.59 0.76  --     The CrCl is unknown because both a height and weight (above a minimum accepted value) are required for this calculation.   Medical History: Past Medical History  Diagnosis Date  . Depression   . DVT (deep venous thrombosis)     hx of on HRT left leg ~2006  . GERD (gastroesophageal reflux disease)   . Hyperlipidemia   . Hypertension   . Hypothyroidism   . PPD positive, treated     rx inh   . OSA on CPAP   . Diabetes mellitus   . 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 Breast cancer     a. 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 [COMPLETED] sodium chloride   Intravenous Once  . amiodarone  400 mg Oral BID  . aspirin EC  81 mg Oral q morning - 10a  . atorvastatin  10 mg Oral QHS  . buPROPion  150 mg Oral q morning - 10a  . escitalopram  10 mg Oral QHS  . [COMPLETED] fentaNYL  50 mcg Intravenous Once  . furosemide  80 mg Oral BID  . insulin aspart  0-9 Units Subcutaneous TID WC  .  letrozole  2.5 mg Oral Daily  . [START ON 09/08/2012] levothyroxine  125 mcg Oral QAC breakfast  . megestrol  400 mg Oral BID  . metoprolol tartrate  75 mg Oral BID  . [COMPLETED] morphine  2 mg Intravenous Once  . potassium chloride  10 mEq Oral Daily  . sodium chloride  10-40 mL Intracatheter Q12H  . sodium chloride  3 mL Intravenous Q12H  . Warfarin - Pharmacist Dosing Inpatient   Does not apply q1800  . [DISCONTINUED] sodium chloride   Intravenous STAT   Infusions:    Assessment:  57 year old female with history of breast cancer, AFib, CHF.  Recent hospitalization (discharged 4/28)  Returned to hospital for worsening leg swelling and weakness.  Patient with AFib and h/o DVT.  Recently started on Amiodarone and Warfarin for AFib by pt's cardiologist.  On warfarin 4 mg daily PTA - last dose noted as taken on 4/28  INR on 4/28 = 2.19  INR today (4/29) = 3.86  Significant increase in INR in past 24 hrs   Goal of Therapy:  INR 2-3   Plan:  No warfarin tonight as INR supratherapeutic  Check daily PT/INR  Merica Prell, Joselyn Glassman, PharmD 09/07/2012,5:47 PM

## 2012-09-07 NOTE — Plan of Care (Signed)
Problem: Phase I Progression Outcomes Goal: Voiding-avoid urinary catheter unless indicated Outcome: Not Applicable Date Met:  09/07/12 Foley noted pt for comfort care per pt request.

## 2012-09-07 NOTE — ED Notes (Signed)
Pt transported to 1515 on cardiac monitor accompanied by this nurse with chart and personal belongings, condition stable at time of transfer.

## 2012-09-07 NOTE — Progress Notes (Signed)
Called to patient room for assistance with home CPAP machine. Husband at bedside states the machine keeps "cutting off by itself". Electrical cords intact and plugged up to outlet appropriately. Patient is using home nasal pillows. Home machine is set for auto on/off. Patient confirms my findings to be true. VSS.

## 2012-09-07 NOTE — Progress Notes (Signed)
[name]   DOB:1955/11/25   HQ#:[id number]   UXL#:[id number]  Subjective: [name] is back in the hospital 24 hours after discharge. She had quite a bit of pain "in her bottom" when transported from and to the hospital, and continues to have uncontrolled diarrhea and nausea. She "doesn't want to live like this." She feels things will never get better, she will never walk or feel well again, and is "ready to go." Husband, daughter, sister and brother in law in room   Objective: middle aged white woman examined in bed Filed Vitals:   09/07/12 1522  BP: 106/58  Pulse: 66  Temp: 97.5 F (36.4 [name])  Resp: 18    There is no weight on file to calculate BMI.  Intake/Output Summary (Last 24 hours) at 09/07/12 1828 Last data filed at 09/07/12 1428  Gross per 24 hour  Intake      0 ml  Output    450 ml  Net   -450 ml     Oropharynx clear, dry  No cervical or supraclavicular adenomapthy  Lungs clear -- auscultated anteriorly  Heart regular, no myurmur heard  Abdomen soft, +BS, PEG in place  MSK LE edema as before  Neuro nonfocal, alert and oriented x3, affect is not depressed, rather irritated at the constant discomfort and humiliation she is undergoing and resigned to not getting better  Breast exam: deferred  CBG (last 3)   Recent Labs  09/06/12 0725 09/06/12 1146 09/07/12 1617  GLUCAP 103* 135* 89     Labs:  Lab Results  Component Value Date   WBC 11.9* 09/07/2012   HGB 9.6* 09/07/2012   HCT 30.1* 09/07/2012   MCV 85.8 09/07/2012   PLT 249 09/07/2012   NEUTROABS 9.0* 09/07/2012    @LASTCHEMISTRY @  Urine Studies No results found for this basename: UACOL, UAPR, USPG, UPH, UTP, UGL, UKET, UBIL, UHGB, UNIT, UROB, ULEU, UEPI, UWBC, URBC, UBAC, CAST, CRYS, UCOM, BILUA,  in the last 72 hours  Basic Metabolic Panel:  Recent Labs Lab 09/03/12 0400 09/04/12 0503 09/05/12 0606 09/06/12 0500 09/07/12 1020  NA 133* 133* 134* 133* 130*  K 3.3* 3.5 3.8 4.5 4.9  CL 96 94* 98 98  94*  CO2 36* 33* 33* 32 28  GLUCOSE 88 95 96 103* 108*  BUN 16 16 15 16  27*  CREATININE 0.60 0.57 0.56 0.59 0.76  CALCIUM 7.5* 7.6* 7.6* 7.6* 8.0*  MG 1.8  --   --   --   --    GFR The CrCl is unknown because both a height and weight (above a minimum accepted value) are required for this calculation. Liver Function Tests:  Recent Labs Lab 09/07/12 1020  AST 40*  ALT 27  ALKPHOS 213*  BILITOT 0.3  PROT 5.7*  ALBUMIN 1.6*   No results found for this basename: LIPASE, AMYLASE,  in the last 168 hours No results found for this basename: AMMONIA,  in the last 168 hours Coagulation profile  Recent Labs Lab 09/03/12 0400 09/04/12 0503 09/05/12 0606 09/06/12 0500 09/07/12 1640  INR 1.73* 1.92* 1.93* 2.19* 3.86*    CBC:  Recent Labs Lab 09/03/12 0400 09/04/12 0503 09/05/12 0606 09/06/12 0500 09/07/12 1020  WBC 5.6 8.9 6.4 9.8 11.9*  NEUTROABS  --   --   --   --  9.0*  HGB 10.1* 10.4* 9.6* 9.7* 9.6*  HCT 31.4* 31.9* 29.7* 30.3* 30.1*  MCV 85.3 85.5 85.3 86.1 85.8  PLT 157 217 198 233  249   Cardiac Enzymes: No results found for this basename: CKTOTAL, CKMB, CKMBINDEX, TROPONINI,  in the last 168 hours BNP: No components found with this basename: POCBNP,  CBG:  Recent Labs Lab 09/05/12 1625 09/05/12 2142 09/06/12 0725 09/06/12 1146 09/07/12 1617  GLUCAP 116* 120* 103* 135* 89   D-Dimer No results found for this basename: DDIMER,  in the last 72 hours Hgb A1c No results found for this basename: HGBA1C,  in the last 72 hours Lipid Profile No results found for this basename: CHOL, HDL, LDLCALC, TRIG, CHOLHDL, LDLDIRECT,  in the last 72 hours Thyroid function studies No results found for this basename: TSH, T4TOTAL, FREET3, T3FREE, THYROIDAB,  in the last 72 hours Anemia work up No results found for this basename: VITAMINB12, FOLATE, FERRITIN, TIBC, IRON, RETICCTPCT,  in the last 72 hours Microbiology No results found for this or any previous visit (from  the past 240 hour(s)).    Studies:  No results found.    Assessment: 57 y.o. [name] woman admitted with A Fib/ RVR, readmitted with severe diarrhea, poorly controlled pain and nausea  (1) status post right mastectomy under the care of Dr. 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 Lymphovascular perineural invasion was identified. 2 of 18 lymph nodes were involved. Margins were clear. Pathologic staging pT3, pN1a, stage IIIA.   (2) treated in the adjuvant setting with docetaxel/cyclophosphamide given every 3 weeks, with Neulasta on day 2 for granulocyte support. First treatment was given on 07/06/2012. Due to intolerance, we decreased her doses by 15% beginning cycle 2, given 07/29/2012. However, tolerance remained poor and no further chemotherapy is planned   (3) patient will need postmastectomy radiation--to see Dr Michell Heinrich 09/09/2012   (4) letrozole started April 2014, plan is to continue for 5 years   (4) comorbidities include diabetes, hypertension, chronic liver disease, and [name] diff negative diarrhea..  (5) malnutrition: s/p PEG placement  (6) end of life issues: patient refusing feedings, medications,    Plan: while [name] carries a diagnosis of stage III breast cancer, there is no evidence of active cancer at present and with antiestrogens she has a roughly 50% chance of cure. Her diarrhea was [name] difficile negative before, but may be + now--PCR pending. Either way we should be able to bring it under control. She is very weak from her prolonged stay in ICU but I know no reason she could not slowly regain her strength and eventually ambulate normally. She has been confused in the past but is certainly not confused tonight.  In short while I agree with a DNR order as per patient's request, I see no irreversible terminal condition here.  Rather the patient "has had it" with her physical condition and the interventions we have attempted to relieve it. The family feels very guilty--that they have "failed her"--though they have been in my experience constantly attentive and supportive.  I asked her what she wanted me to do for her tonight and she wants her pain controlled, her nausea treated, and her diarrhea stopped. I went over the medications we can try to accomplish that and she agreed to them. Accordingly I wrote the appropriate orders.  My hope is as [name]'s symptoms improve and she does not die, she may want to give rehabilitation another try.  The family will also need significant support. I will follow  with you.    [name] 09/07/2012

## 2012-09-07 NOTE — Telephone Encounter (Signed)
Noted  

## 2012-09-08 ENCOUNTER — Telehealth: Payer: Self-pay | Admitting: *Deleted

## 2012-09-08 ENCOUNTER — Ambulatory Visit: Payer: 59

## 2012-09-08 ENCOUNTER — Observation Stay (HOSPITAL_COMMUNITY): Payer: 59

## 2012-09-08 DIAGNOSIS — Z515 Encounter for palliative care: Secondary | ICD-10-CM

## 2012-09-08 DIAGNOSIS — F4323 Adjustment disorder with mixed anxiety and depressed mood: Secondary | ICD-10-CM

## 2012-09-08 DIAGNOSIS — R52 Pain, unspecified: Secondary | ICD-10-CM

## 2012-09-08 DIAGNOSIS — F339 Major depressive disorder, recurrent, unspecified: Secondary | ICD-10-CM

## 2012-09-08 DIAGNOSIS — F329 Major depressive disorder, single episode, unspecified: Secondary | ICD-10-CM

## 2012-09-08 LAB — CBC
Hemoglobin: 8.9 g/dL — ABNORMAL LOW (ref 12.0–15.0)
MCH: 27 pg (ref 26.0–34.0)
MCHC: 31 g/dL (ref 30.0–36.0)
Platelets: 210 10*3/uL (ref 150–400)
RDW: 26.6 % — ABNORMAL HIGH (ref 11.5–15.5)

## 2012-09-08 LAB — COMPREHENSIVE METABOLIC PANEL
ALT: 27 U/L (ref 0–35)
AST: 38 U/L — ABNORMAL HIGH (ref 0–37)
Albumin: 1.7 g/dL — ABNORMAL LOW (ref 3.5–5.2)
Alkaline Phosphatase: 180 U/L — ABNORMAL HIGH (ref 39–117)
Calcium: 7.8 mg/dL — ABNORMAL LOW (ref 8.4–10.5)
GFR calc Af Amer: >90 mL/min (ref >90)
Potassium: 4.6 mEq/L (ref 3.5–5.1)
Sodium: 136 mEq/L (ref 135–145)
Total Protein: 5.5 g/dL — ABNORMAL LOW (ref 6.0–8.3)

## 2012-09-08 LAB — GLUCOSE, CAPILLARY
Glucose-Capillary: 91 mg/dL (ref 70–99)
Glucose-Capillary: 77 mg/dL (ref 70–99)

## 2012-09-08 MED ORDER — ESCITALOPRAM OXALATE 20 MG PO TABS
20.0000 mg | ORAL_TABLET | Freq: Every day | ORAL | Status: DC
  Administered 2012-09-08 – 2012-09-12 (×5): 20 mg via ORAL
  Filled 2012-09-08 (×6): qty 1

## 2012-09-08 MED ORDER — OLANZAPINE 2.5 MG PO TABS
2.5000 mg | ORAL_TABLET | Freq: Every day | ORAL | Status: DC
  Administered 2012-09-08 – 2012-09-12 (×5): 2.5 mg via ORAL
  Filled 2012-09-08 (×6): qty 1

## 2012-09-08 NOTE — Consult Note (Signed)
Patient ZO:XWRUE JANIEL DERHAMMER      DOB: 08-01-1955      AVW:[id number]     Consult Note from the Palliative Medicine Team at [facility]    Consult Requested by:  Dr. Earl Lites al     PCP: [name]., NP Reason for Consultation: goals of care     Phone Number:[phone] Related symptom managment Assessment of patients Current state: 57 yr old white female with recent diagnosis of breast cancer s/p mastectomy in January.  Patient is being readmitted after being discharged to home with leg weakness (I can't stand or walk- was walking prior to discharge),  Continued to have diarrhea , and is refusing to eat or be feed through an existing feeding tube.  She is obviously distraught over previous discharge experience and states she wants to die and she will not eat or take her meds.  She wants no further treatment of her condition. She states I just want to die in peace, now.   Goals of Care: 1.  Code Status:  DNR/ DNI  2. Scope of Treatment:  Patient is agreeable to allowing psychiatry try to adjust  Her medications. Will continue to offer her other medications and permit her to refuse them is desired until we have a better picture for her mental health issues.  Will ask for psychosocial support from our palliative social worker.   Reviewed Dr. Darrall Dears note and concur that she is not as near death as she wishes she was.  This information was lovingly conveyed and we will continue to talk through this with her.  I believe that she would benefit from the comfort of the palliative care unit where we could continue to work her through her difficulties with her family and and her disease process.  She can continue treatments and feedings if she desires.   4. Disposition: to be determined ,  Had bad experience at SNF states she doesn't want to go home and burden family.   3. Symptom Management:   1. Anxiety/Agitation: related to end of life issues.  Patient is trying to regain control of her  life and express her wishes but is failing to see that she is not actively dying right this minute.  Try CBT with the interdisciplinary team. 2. Pain: related more to sacral irritation from diarrhea, now resolved. Can consider re-consult  wound care  If measures outlined are not effective. 3. Bowel Regimen: no further loose stools on questran. 4. Delirium: patient appears alert and oriented but abnormally focused on her death which she believes is pending.   5. Failure to thrive- refusing tube feeding or eating by mouth.  Address mental health issues as above,  Consider CT/ MRI brain to see if their is a CNS reason for current status, 6. Inability to walk unclear whether this is related to desire vs physiologic state.  Consider further evaluation if this does not improve with cognitive work.  4. Psychosocial: Second marriage,  Two daughters Carollee Herter, and [place].  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  5. Spiritual: some spiritual base but was not able to fully explore.  Chaplain ministering to her.        Patient Documents Completed or Given: Document Given Completed  Advanced Directives Pkt    MOST    DNR    Gone from My Sight    Hard Choices      Brief HPI: 57 yr old female admitted with leg  weakness, persistent diarrhea and recent diagnosis of breast cancer.  She is s/p mastectomy,  And did not do well at rehab.  Returns with inability walk, refusing to eat or take meds.   ROS:  Diarrhea has stopped,  Some pain on sacral area,  Feels like her breathing is heavy and is using her cpap    PMH:  Past Medical History  Diagnosis Date  . Depression   . DVT (deep venous thrombosis)     hx of on HRT left leg ~2006  . GERD (gastroesophageal reflux disease)   . Hyperlipidemia   . Hypertension   . Hypothyroidism   . PPD positive, treated     rx inh   . OSA on CPAP   . Diabetes mellitus   . 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  Breast cancer     a. 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 Tubal ligation    . Cholecystectomy    . Foot surgery    . Eye surgery    . Cataract extraction    . Percutaneous liver biopsy    . Breast biopsy      left breast  . Mastectomy modified radical  06/08/2012    Procedure: MASTECTOMY MODIFIED RADICAL;  Surgeon: Mariella Saa, MD;  Location[facility] OR;  Service: General;  Laterality: Right;  . Portacath placement  06/08/2012    Procedure: INSERTION PORT-A-CATH;  Surgeon: Mariella Saa, MD;  Location: [facility] OR;  Service: General;  Laterality: Left;  . Peg tube placement     I have reviewed the FH and SH and  If appropriate update it with new information. Allergies  Allergen Reactions  . Olmesartan Medoxomil Cough    REACTION: ? if cough  . Tetracycline Hcl     Unknown reaction, too long for patient to remember   . Venlafaxine     REACTION: severe dry moouth  . Adhesive (Tape) Rash   Scheduled Meds: . amiodarone  400 mg Oral BID  . aspirin EC  81 mg Oral q morning - 10a  . atorvastatin  10 mg Oral QHS  . buPROPion  150 mg Oral q morning - 10a  . cholestyramine  4 g Oral TID  . escitalopram  20 mg Oral QHS  . insulin aspart  0-9 Units Subcutaneous TID WC  . letrozole  2.5 mg Oral Daily  . levothyroxine  125 mcg Oral QAC breakfast  . loperamide  2 mg Oral QID  . megestrol  400 mg Oral BID  . metoprolol tartrate  75 mg Oral BID  . morphine  1 mg Intravenous QID  . OLANZapine  2.5 mg Oral QHS  . ondansetron (ZOFRAN) IV  8 mg Intravenous Q8H  . potassium chloride  10 mEq Oral Daily  . promethazine  12.5 mg Intravenous QID  . sodium chloride  10-40 mL Intracatheter Q12H  . sodium chloride  3 mL Intravenous Q12H  . Warfarin - Pharmacist Dosing Inpatient   Does not apply q1800   Continuous Infusions:   PRN Meds:.sodium chloride, antiseptic oral rinse, morphine injection, sodium chloride, sodium chloride    BP 103/51  Pulse 65  Temp(Src) 97.4 F (36.3 C) (Oral)  Resp 16  SpO2 97%   PPS:30%   Intake/Output Summary (Last 24 hours) at  09/08/12 1559 Last data filed at 09/08/12 0700  Gross per 24 hour  Intake    120 ml  Output    850 ml  Net   -730 ml     Physical Exam:  General:  Slightly agitated, oriented to self and time and place HEENT:  [place], PERRL, EOMI, anicteric, mm dry Chest:   Decreased with no R,R, or wheezes at this time.  No decreased sensation to suggest thoracic cord issue CVS: regular rate and rhythm, S1, S2 Abdomen:soft, PEg intact, not tender Ext: warm,  Mild pain with passive raising of the legs bilaterally,  She can barely lift legs off bed and and can not bend knees. Neuro: tearful begging to die,  Oriented to time and place., and person  Labs: CBC    Component Value Date/Time   WBC 10.1 09/08/2012 0500   WBC 3.3* 07/28/2012 1145   RBC 3.30* 09/08/2012 0500   RBC 4.18 07/28/2012 1145   HGB 8.9* 09/08/2012 0500   HGB 10.6* 07/28/2012 1145   HCT 28.7* 09/08/2012 0500   HCT 33.1* 07/28/2012 1145   PLT 210 09/08/2012 0500   PLT 177 07/28/2012 1145   MCV 87.0 09/08/2012 0500   MCV 79.1* 07/28/2012 1145   MCH 27.0 09/08/2012 0500   MCH 25.4 07/28/2012 1145   MCHC 31.0 09/08/2012 0500   MCHC 32.1 07/28/2012 1145   RDW 26.6* 09/08/2012 0500   RDW 17.8* 07/28/2012 1145   LYMPHSABS 1.8 09/07/2012 1020   LYMPHSABS 0.7* 07/28/2012 1145   MONOABS 1.1* 09/07/2012 1020   MONOABS 0.4 07/28/2012 1145   EOSABS 0.0 09/07/2012 1020   EOSABS 0.2 07/28/2012 1145   BASOSABS 0.0 09/07/2012 1020   BASOSABS 0.0 07/28/2012 1145     CMP     Component Value Date/Time   NA 136 09/08/2012 0500   NA 146* 07/28/2012 1145   K 4.6 09/08/2012 0500   K 3.4* 07/28/2012 1145   CL 101 09/08/2012 0500   CL 108* 07/28/2012 1145   CO2 29 09/08/2012 0500   CO2 29 07/28/2012 1145   GLUCOSE 89  09/08/2012 0500   GLUCOSE 77 07/28/2012 1145   GLUCOSE 92 03/06/2010   BUN 27* 09/08/2012 0500   BUN 5.0* 07/28/2012 1145   CREATININE 0.75 09/08/2012 0500   CREATININE 0.9 07/28/2012 1145   CREATININE 1.30* 07/23/2012 1342   CALCIUM 7.8* 09/08/2012 0500   CALCIUM 8.2* 07/28/2012 1145   PROT 5.5* 09/08/2012 0500   PROT 6.3* 07/28/2012 1145   ALBUMIN 1.7* 09/08/2012 0500   ALBUMIN 2.6* 07/28/2012 1145   AST 38* 09/08/2012 0500   AST 72* 07/28/2012 1145   ALT 27 09/08/2012 0500   ALT 25 07/28/2012 1145   ALKPHOS 180* 09/08/2012 0500   ALKPHOS 141 07/28/2012 1145   BILITOT 0.4 09/08/2012 0500   BILITOT 0.58 07/28/2012 1145   GFRNONAA >90 09/08/2012 0500   GFRAA >90 09/08/2012 0500       Time In Time Out Total Time Spent with Patient Total Overall Time  800 am  932 am 70 min 92 min   Discussed with dr. Blake Divine and Psychiatry  Greater than 50%  of this time was spent counseling and coordinating care related to the above assessment and plan.   Chyna Kneece L. Ladona Ridgel, MD MBA The Palliative Medicine Team at [facility] Phone: [phone] Pager: [phone]

## 2012-09-08 NOTE — Progress Notes (Signed)
ANTICOAGULATION CONSULT NOTE - Follow Up Consult  Pharmacy Consult for Coumadin Indication: Atrial Fibrillation  Allergies  Allergen Reactions  . Olmesartan Medoxomil Cough    REACTION: ? if cough  . Tetracycline Hcl     Unknown reaction, too long for patient to remember   . Venlafaxine     REACTION: severe dry moouth  . Adhesive (Tape) Rash     Recent Labs  09/06/12 0500 09/07/12 1020 09/07/12 1640 09/08/12 0500  HGB 9.7* 9.6*  --  8.9*  HCT 30.3* 30.1*  --  28.7*  PLT 233 249  --  210  LABPROT 23.4*  --  35.6* 36.7*  INR 2.19*  --  3.86* 4.02*  HEPARINUNFRC 0.46  --   --   --   CREATININE 0.59 0.76  --  0.75    The CrCl is unknown because both a height and weight (above a minimum accepted value) are required for this calculation.   Assessment: 34 yoF recently hospitalized for electrolyte abnormalities, malnutrition, severe deconditioning and atrial fibrillation, discharged 4/28 but readmitted 4/29 with severe diarrhea, uncontrolled pain, and nausea.  PMHx significant for atrial fibrillation started on anticoagulation during last admission, chronic liver disease with elevated INR at baseline, hx breast cancer s/p mastectomy 05/2012, 2 cycles chemotherapy with no further chemotherapy plans, and malnutrition with recent PEG placement.  Warfarin resumed per pharmacy.    INR supratherapeutic and rising despite held coumadin dose last night. Discharged on coumadin 4mg  daily.   Pt refusing to take PO meds, refusing to eat.    CBC: Hgb small decrease, plts ok.  Small amount of bright red on pad noted per RN last night.   Recently started on amiodarone, no other DDIs noted.   Goal of Therapy:  INR 2-3 Monitor platelets by anticoagulation protocol: Yes   Plan:  Hold coumadin tonight.  F/u daily PT/INR.    Analiza Cowger E 09/08/2012,10:50 AM

## 2012-09-08 NOTE — Progress Notes (Signed)
Patient [name]      DOB: June 17, 1955      AVW:[id number]  Please note the patient has informed her family that when she dies she does not want to be sent to the morgue as she is claustraphobic and never wanted to be in the morgue.  She asks that we call [name] in [place] to come and pick her up directly.   [name] L. Ladona Ridgel, MD MBA The Palliative Medicine Team at [facility] Phone: [phone] Pager: [phone]

## 2012-09-08 NOTE — Progress Notes (Signed)
Palliative Medicine Team SW Pt discussed in PMT rounds, referred for psychosocial support. Upon visit, husband very tearful, provided extensive emotional support until he left with family to get medication for his "nerves". Extensive psychosocial support provided to pt and family along with help of Cancer Ctr CSW, Psych CSW, Psych MD, Chaplain and RN at various visits. Pt seen by Psych MD who deemed her to have decision-making capacity.  Pt requested assistance from Cancer Ctr CSW and myself in amending her HPOA, completing a Living Will and documenting preference for final arrangements. Pt documented her desire NOT to have any life prolonging measures or artificial nutrition, NOT to be an organ donor, and for her Living Will to take precedence over HPOA, [facility]. Pt also confirmed info provided to PMT MD about her wish to NOT go to morgue, but to be transferred directly to [place] in [place]. Psych CSW present to notarize new advance directive. Pt made it explicitly clear that she does NOT want husband made aware of these changes at this time.  Later met with pt with her dtr Carollee Herter. Pt able to express her sense of spiritual fatigue and desire for her life to end soon. Validated pt's struggles and her feelings. Assured pt that care team "want what she wants", but also encouraged her to consider choices in the event that death does not come as soon as she would like. Pt reports she does not want to return home as she does not want to "burden" her family with her care needs. Pt discussed desire to consider residential hospice for disposition. Explained to pt that there are certain criteria for residential hospice and that she would have to be determined appropriate by the respective facility. Also discussed SNF as an option for care, to which pt agreed to give thought. We agreed to have pt focus on getting some rest for now and that unit CSW is available discuss all options with her closer  to d/c. Also gave extensive support to pt's dtr Carollee Herter who has her own hx of depression, is recently separated, and has little emotional support. Dtr very reasonable, expressed her understanding that pt is not imminently dying, but trying to allow her mtr as much control as possible.   Discussed with care team the need to work with family to give pt space and time to rest, for sx to improve, and for her to consider her options. Will continue to follow to work with family as they are struggling with reality that the more pressure they place on her to "change her mind", the more pt "digs in her heels" in an effort to retain much desired control.   Kennieth Francois, LCSWA PMT phone [phone] Pager [phone]

## 2012-09-08 NOTE — Progress Notes (Signed)
[facility]  Patient Status: Active (receiving services up to time of hospitalization)  [facility] is providing the following services: RN, PT, OT, MSW and HHA  If patient discharges after hours, please call [phone].   [name] 09/08/2012, 11:48 AM

## 2012-09-08 NOTE — Progress Notes (Signed)
[name]   DOB:1956/01/19   AV#:[id number]   NWG#:[id number]  Subjective: [name] is feeling better. The diarrhea has stopped. Pain is well controlled. She is still a little nauseated and I reminded her she can ask for phenergan as needed. Multiple meetings w pt and family by various services noted. No fmaily in room  Objective: middle aged white woman examined in bed, using C-pap Filed Vitals:   09/07/12 2155  BP: 103/51  Pulse: 65  Temp: 97.4 F (36.3 C)  Resp: 16    There is no weight on file to calculate BMI.  Intake/Output Summary (Last 24 hours) at 09/08/12 1624 Last data filed at 09/08/12 0700  Gross per 24 hour  Intake    120 ml  Output    850 ml  Net   -730 ml     Oropharynx dry  No cervical or supraclavicular adenomapthy  Lungs clear -- auscultated anteriorly  Heart regular, no murmur heard  Abdomen soft, +BS.  MSK LE edema as before  Neuro nonfocal, alert and oriented x3, tired but relaxed affect  Breast exam: deferred  CBG (last 3)   Recent Labs  09/06/12 1146 09/07/12 1617 09/07/12 2213  GLUCAP 135* 89 91     Labs:  Lab Results  Component Value Date   WBC 10.1 09/08/2012   HGB 8.9* 09/08/2012   HCT 28.7* 09/08/2012   MCV 87.0 09/08/2012   PLT 210 09/08/2012   NEUTROABS 9.0* 09/07/2012    @LASTCHEMISTRY @  Urine Studies No results found for this basename: UACOL, UAPR, USPG, UPH, UTP, UGL, UKET, UBIL, UHGB, UNIT, UROB, ULEU, UEPI, UWBC, URBC, UBAC, CAST, CRYS, UCOM, BILUA,  in the last 72 hours  Basic Metabolic Panel:  Recent Labs Lab 09/03/12 0400 09/04/12 0503 09/05/12 0606 09/06/12 0500 09/07/12 1020 09/08/12 0500  NA 133* 133* 134* 133* 130* 136  K 3.3* 3.5 3.8 4.5 4.9 4.6  CL 96 94* 98 98 94* 101  CO2 36* 33* 33* 32 28 29  GLUCOSE 88 95 96 103* 108* 89  BUN 16 16 15 16  27* 27*  CREATININE 0.60 0.57 0.56 0.59 0.76 0.75  CALCIUM 7.5* 7.6* 7.6* 7.6* 8.0* 7.8*  MG 1.8  --   --   --   --   --    GFR The CrCl is unknown because both  a height and weight (above a minimum accepted value) are required for this calculation. Liver Function Tests:  Recent Labs Lab 09/07/12 1020 09/08/12 0500  AST 40* 38*  ALT 27 27  ALKPHOS 213* 180*  BILITOT 0.3 0.4  PROT 5.7* 5.5*  ALBUMIN 1.6* 1.7*   No results found for this basename: LIPASE, AMYLASE,  in the last 168 hours No results found for this basename: AMMONIA,  in the last 168 hours Coagulation profile  Recent Labs Lab 09/04/12 0503 09/05/12 0606 09/06/12 0500 09/07/12 1640 09/08/12 0500  INR 1.92* 1.93* 2.19* 3.86* 4.02*    CBC:  Recent Labs Lab 09/04/12 0503 09/05/12 0606 09/06/12 0500 09/07/12 1020 09/08/12 0500  WBC 8.9 6.4 9.8 11.9* 10.1  NEUTROABS  --   --   --  9.0*  --   HGB 10.4* 9.6* 9.7* 9.6* 8.9*  HCT 31.9* 29.7* 30.3* 30.1* 28.7*  MCV 85.5 85.3 86.1 85.8 87.0  PLT 217 198 233 249 210   Cardiac Enzymes: No results found for this basename: CKTOTAL, CKMB, CKMBINDEX, TROPONINI,  in the last 168 hours BNP: No components found with this basename: POCBNP,  CBG:  Recent Labs Lab 09/05/12 2142 09/06/12 0725 09/06/12 1146 09/07/12 1617 09/07/12 2213  GLUCAP 120* 103* 135* 89 91   D-Dimer No results found for this basename: DDIMER,  in the last 72 hours Hgb A1c No results found for this basename: HGBA1C,  in the last 72 hours Lipid Profile No results found for this basename: CHOL, HDL, LDLCALC, TRIG, CHOLHDL, LDLDIRECT,  in the last 72 hours Thyroid function studies No results found for this basename: TSH, T4TOTAL, FREET3, T3FREE, THYROIDAB,  in the last 72 hours Anemia work up No results found for this basename: VITAMINB12, FOLATE, FERRITIN, TIBC, IRON, RETICCTPCT,  in the last 72 hours Microbiology No results found for this or any previous visit (from the past 240 hour(s)).    Studies:  Ct Head Wo Contrast  09/08/2012  *RADIOLOGY REPORT*  Clinical Data: Lower extremity weakness.  Breast cancer  CT HEAD WITHOUT CONTRAST   Technique:  Contiguous axial images were obtained from the base of the skull through the vertex without contrast.  Comparison: None  Findings: Mild atrophy.  Ventricle size is normal.  Negative for acute or chronic infarct.  Negative for hemorrhage or mass lesion. No skull lesion is identified.  IMPRESSION: No acute abnormality.  Negative for stroke or edema.   Original Report Authenticated By: [name], M.D.       Assessment: 57 y.o. [name] woman admitted with A Fib/ RVR, readmitted with severe diarrhea, poorly controlled pain and nausea  (1) status post right mastectomy under the care of Dr. 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 Lymphovascular perineural invasion was identified. 2 of 18 lymph nodes were involved. Margins were clear. Pathologic staging pT3, pN1a, stage IIIA.   (2) treated in the adjuvant setting with docetaxel/cyclophosphamide given every 3 weeks, with Neulasta on day 2 for granulocyte support. First treatment was given on 07/06/2012. Due to intolerance, we decreased her doses by 15% beginning cycle 2, given 07/29/2012. However, tolerance remained poor and no further chemotherapy is planned   (3) patient will need postmastectomy radiation--to see Dr Michell Heinrich 09/09/2012   (4) letrozole started April 2014, plan is to continue for 5 years;  (4) comorbidities include diabetes, hypertension, chronic liver disease, and c diff negative diarrhea..  (5) malnutrition: s/p PEG placement  (6) end of life issues: patient refusing feedings, medications; DNR in place    Plan: [name] looks more relaxed. The demoralizing and painful diarrhea seems to have abated. The small amounts of morphine seem to be keeping things calm. I am hopeful with continuing support [name] may come around to trying harder to get back on her feet. Family will  need much support either way. Will follow with you.    [name] C 09/08/2012

## 2012-09-08 NOTE — Consult Note (Addendum)
Patient WU:JWJXB TERYL GUBLER      DOB: [birth year]      JYN:[id number]   Summary of Goals of Care; full note to follow:  Met with spouse [name],  Daughter Beverlyn Roux and patient  Magdaline is one of our [facility] room secretaries .   Patient states " She just wants to go on"  She relates that she is tired and she just wants to die.  She is able to report that Dr. Arlice Colt has told her that her cancer is "gone" right now.  She reports that even though she knows that she might be able to get better, she does not want to . She reports her diarrhea is better, but that she can't walk. While in the room she asked me to listen to her heart again because she wants to be over.  She reports that she does not want anyone to change her mind.  She reports that she has made a choice to not eat because she wants it to be over.  Initially she stopped eating by mouth because of her diarrhea.  At this time, her family would like to see if she could feed by her feeding tube to get her strength back.  They stated that if this were her cancer the could understand and would support her choices for comfort, but they do not feel that this is the case.   Patient perseverating on dying.  Reports her care at rest home was horrific and she never wants to go their again.  States she doesn't want to go home and be burden to her children and spouse.  She just wants to stay her and "get it over with." She states she has nothing in this world to live for then recants and says except for my girls and you (spouse)   Symptoms:  Currently, pain described related to skin irritation from frequent bowel movements.  Relates diarrhea has stopped.   States she feels like her breathing is getting heavier.  Patient states all negative things at my suggestions for positive things unable to see a positive outcome.   Recommendations:  Psychiatric evaluation for psychotic depression, and capacity.  Consider CT of head  with contrast vs MRI  (don't think she will let you do MRI) to rule out CNS cause for emotional state and lower extremity weakness.  I do not detect any sensation derangements to suggest spinal source for lower extremity symptoms at least on general examination.  Further neurological evaluation at the discretion of the primary service if symptoms appear to be suggesting the need for that. Total time: 800 am - 932 am   Discussed with Dr. Marni Griffon L. Ladona Ridgel, MD MBA The Palliative Medicine Team at [facility] Phone: [phone] Pager: [phone]

## 2012-09-08 NOTE — Progress Notes (Signed)
Patient and daughter were in the room at beginning of visit. Daughter [name] expressed her frustration at her mother's "stubbornness." After she left, [name] expressed her peace with her decision to stop treatments. She talked at length about Heaven and being ready. She expressed some sadness that her family does not understand her decision and seems to be unable to see how much she has suffered and how hard she has fought. She said, "I have tried to get them there but they just can't see it." She is tired and ready for rest. Brought her a prayer shawl; presence, prayer, and listening.

## 2012-09-08 NOTE — Progress Notes (Signed)
Clinical Social Work Department CLINICAL SOCIAL WORK PSYCHIATRY SERVICE LINE ASSESSMENT 09/08/2012  Patient:  [name]  Account:  [id number]  Admit Date:  09/07/2012  Clinical Social Worker:  Unk Lightning, LCSW  Date/Time:  09/08/2012 10:20 AM Referred by:  Physician  Date referred:  09/08/2012 Reason for Referral  Psychosocial assessment   Presenting Symptoms/Problems (In the person's/family's own words):   Psych consulted to determine capacity and to evaluate depression   Abuse/Neglect/Trauma History (check all that apply)  Denies history   Abuse/Neglect/Trauma Comments:   Psychiatric History (check all that apply)  Outpatient treatment   Psychiatric medications:  Wellbutrin SR 150mg   Lexapro 10mg    Current Mental Health Hospitalizations/Previous Mental Health History:   Patient reports she has been depressed for several years. Patient reports that she has been on several different medications and has tried individual and support groups in the past. Patient reports that she does not feel therapy was beneficial.   Current provider:   PCP   Place and Date:   [place], [place]   Current Medications:   sodium chloride, antiseptic oral rinse, morphine injection, sodium chloride, sodium chloride            . amiodarone  400 mg Oral BID  . aspirin EC  81 mg Oral q morning - 10a  . atorvastatin  10 mg Oral QHS  . buPROPion  150 mg Oral q morning - 10a  . cholestyramine  4 g Oral TID  . escitalopram  10 mg Oral QHS  . insulin aspart  0-9 Units Subcutaneous TID WC  . letrozole  2.5 mg Oral Daily  . levothyroxine  125 mcg Oral QAC breakfast  . loperamide  2 mg Oral QID  . megestrol  400 mg Oral BID  . metoprolol tartrate  75 mg Oral BID  . morphine  1 mg Intravenous QID  . ondansetron (ZOFRAN) IV  8 mg Intravenous Q8H  . potassium chloride  10 mEq Oral Daily  . promethazine  12.5 mg Intravenous QID  . sodium chloride  10-40 mL Intracatheter Q12H  . sodium chloride  3 mL  Intravenous Q12H  . Warfarin - Pharmacist Dosing Inpatient   Does not apply q1800   Previous Impatient Admission/Date/Reason:   None reported   Emotional Health / Current Symptoms    Suicide/Self Harm  None reported   Suicide attempt in the past:   Patient reports she is tired and wants to die. Patient reports she does not want to be talked out of her decision and that she is a [place] and does not believe in suicide. Patient does not have any history of suicide attempts.   Other harmful behavior:   None reported   Psychotic/Dissociative Symptoms  None reported   Other Psychotic/Dissociative Symptoms:    Attention/Behavioral Symptoms  Restless   Other Attention / Behavioral Symptoms:   Patient reports she is tired of having to talk to so many people.    Cognitive Impairment  Within Normal Limits   Other Cognitive Impairment:   Patient alert and oriented. Patient reports that she does not feel her depression is affecting her ability to make any decisions.    Mood and Adjustment  Aggressive/frustrated    Stress, Anxiety, Trauma, Any Recent Loss/Stressor  Other - See comment   Anxiety (frequency):   Phobia (specify):   Compulsive behavior (specify):   Obsessive behavior (specify):   Other:   Patient is hopeless about medical concerns and reports she is tired of  always being in pain. Patient feels she will never be able to walk again.   Substance Abuse/Use  None   SBIRT completed (please refer for detailed history):  N  Self-reported substance use:   Patient denies any substance use.   Urinary Drug Screen Completed:  N Alcohol level:   N/A    Environmental/Housing/Living Arrangement  Stable housing   Who is in the home:   Husband, dtr, son-in-law and grandchildren   Emergency contact:  Psychologist, sport and exercise   Patient's Strengths and Goals (patient's own words):   Patient reports that she strong-minded and able to make  all of her own decisions   Clinical Social Worker's Interpretive Summary:   CSW received referral to complete psychosocial assessment. CSW spoke with MD who reports that PMT completed GOC meeting but feels that patient needs to be evaluated for capacity and her depression needs to be evaluated.    CSW met with patient, husband and two dtrs in the room. Patient agreeable to family involvement during assessment but got frustrated throughout the session when family members would share information.    Patient reports she was diagnosed with breast cancer and recently dc from the hospital. Patient's dtr is her main caregiver and provides 24 hour care. Dtr interrupted to report that patient has made statements about being a burden and is concerned that patient is "giving up" because she feels guilty that family is having to care for her. Patient quickly stated that she does not feel like a burden and that she is wanting to die because she is in pain and knows in her heart that she will never get better.    Patient is upset with husband and asks him to leave the room when he starts to cry. Patient reports that husband constantly tells her that she will get better and that she will be able to walk one day. CSW validated patient's frustration but encouraged patient to also recognize that family is struggling with patient's decision to end treatment. Dtrs were tearful throughout assessment and confirmed that they are not ready for their mother to pass away.    Patient states that she has been depressed for several years. Patient receives medication manage through PCP at this time but is not interested in any further therapy. Patient adamantly refuses that her depression affects any of her decisions regarding end of life care. Patient reports that she is not living a quality life and feels it will be best to stop all treatment.    Patient appears frustrated throughout assessment. Patient is guarded and continues to  report that she does not want to talk about situation and wants to be left alone. Patient appears to be distancing herself from family and will not allow them to speak openly about their feelings in front of her. Patient asked that CSW leave her alone and to inform others that she does not wish to be bothered. CSW offered emotional support for family if interested. Patient agreeable that family can talk with CSW if they desire.    CSW will share information with psych MD and follow any recommendations provided.   Disposition:  Recommend Psych CSW continuing to support while in hospital

## 2012-09-08 NOTE — Telephone Encounter (Signed)
Dr. [name] sent an order in [facility] orders to cancel [name] appt for 09/09/12 due to she is in the hospital....td

## 2012-09-08 NOTE — Progress Notes (Signed)
[facility] PROGRESS NOTE  VELEDA MUN UJW:[id number] DOB: [birth year] DOA: 09/07/2012 PCP: [name]., NP  Assessment/Plan:  Breast cancer  She has Pathologic staging PET 3, P. and 1, stage IIIa. ER and PR positive at 100%, HER -2/neu negative . Recommendations from Dr [name] appreciated. She will require post mastectomy radiation from 09/09/12  Malnutrition  Patient is s/p peg tube and does not want to be fed through the tube. Pt currently refusing tube feeds right noe.  Anasarca  Will continue with po lasix  Chronic pain  Will start with IV morphine 2 mg IV q 4 hr prn  Atrial fibrillation  Will continue with amiodarone and coumadin per pharnmacy  Diabetes Mellitus  Will start the patient on sliding scale insulin.  Diarrhea  Appears to have improved.   Goals of care:she is DNR. Appreciate Dr Ladona Ridgel input.   Consultants:  Psychiatry consult  Palliative care consult.  HPI/Subjective: Refusing all treatment, wants to be left alone. Appears depressed.   Objective: Filed Vitals:   09/07/12 1400 09/07/12 1522 09/07/12 2117 09/07/12 2155  BP: 101/56 106/58  103/51  Pulse: 67 66 64 65  Temp:  97.5 F (36.4 C)  97.4 F (36.3 C)  TempSrc:  Oral  Oral  Resp:  18 18 16   SpO2: 96% 98% 96% 97%    Intake/Output Summary (Last 24 hours) at 09/08/12 0856 Last data filed at 09/08/12 0700  Gross per 24 hour  Intake    120 ml  Output   1300 ml  Net  -1180 ml   There were no vitals filed for this visit.  Exam: Cardiovascular: RRR, S1 normal, S2 normal  Pulmonary/Chest: CTAB, no wheezes, rales, or rhonchi  Abdominal: Soft. Non-tender, non-distended, bowel sounds are normal, no masses, organomegaly, or guarding present.  Neurological: A&O x3, Strenght is normal and symmetric bilaterally, cranial nerve II-XII are grossly intact, no focal motor deficit, sensory intact to light touch bilaterally.  Extremities :Bilateral 2+ edema of the lower  extremities Psychiatry: depressed.     Data Reviewed: Basic Metabolic Panel:  Recent Labs Lab 09/03/12 0400 09/04/12 0503 09/05/12 0606 09/06/12 0500 09/07/12 1020 09/08/12 0500  NA 133* 133* 134* 133* 130* 136  K 3.3* 3.5 3.8 4.5 4.9 4.6  CL 96 94* 98 98 94* 101  CO2 36* 33* 33* 32 28 29  GLUCOSE 88 95 96 103* 108* 89  BUN 16 16 15 16  27* 27*  CREATININE 0.60 0.57 0.56 0.59 0.76 0.75  CALCIUM 7.5* 7.6* 7.6* 7.6* 8.0* 7.8*  MG 1.8  --   --   --   --   --    Liver Function Tests:  Recent Labs Lab 09/07/12 1020 09/08/12 0500  AST 40* 38*  ALT 27 27  ALKPHOS 213* 180*  BILITOT 0.3 0.4  PROT 5.7* 5.5*  ALBUMIN 1.6* 1.7*   No results found for this basename: LIPASE, AMYLASE,  in the last 168 hours No results found for this basename: AMMONIA,  in the last 168 hours CBC:  Recent Labs Lab 09/04/12 0503 09/05/12 0606 09/06/12 0500 09/07/12 1020 09/08/12 0500  WBC 8.9 6.4 9.8 11.9* 10.1  NEUTROABS  --   --   --  9.0*  --   HGB 10.4* 9.6* 9.7* 9.6* 8.9*  HCT 31.9* 29.7* 30.3* 30.1* 28.7*  MCV 85.5 85.3 86.1 85.8 87.0  PLT 217 198 233 249 210   Cardiac Enzymes: No results found for this basename: CKTOTAL, CKMB, CKMBINDEX, TROPONINI,  in  the last 168 hours BNP (last 3 results)  Recent Labs  08/24/12 1225 08/25/12 0400 08/26/12 0500  PROBNP 1816.0* 1915.0* 1184.0*   CBG:  Recent Labs Lab 09/05/12 2142 09/06/12 0725 09/06/12 1146 09/07/12 1617 09/07/12 2213  GLUCAP 120* 103* 135* 89 91    No results found for this or any previous visit (from the past 240 hour(s)).   Studies: No results found.  Scheduled Meds: . amiodarone  400 mg Oral BID  . aspirin EC  81 mg Oral q morning - 10a  . atorvastatin  10 mg Oral QHS  . buPROPion  150 mg Oral q morning - 10a  . cholestyramine  4 g Oral TID  . escitalopram  10 mg Oral QHS  . insulin aspart  0-9 Units Subcutaneous TID WC  . letrozole  2.5 mg Oral Daily  . levothyroxine  125 mcg Oral QAC breakfast   . loperamide  2 mg Oral QID  . megestrol  400 mg Oral BID  . metoprolol tartrate  75 mg Oral BID  . morphine  1 mg Intravenous QID  . ondansetron (ZOFRAN) IV  8 mg Intravenous Q8H  . potassium chloride  10 mEq Oral Daily  . promethazine  12.5 mg Intravenous QID  . sodium chloride  10-40 mL Intracatheter Q12H  . sodium chloride  3 mL Intravenous Q12H  . Warfarin - Pharmacist Dosing Inpatient   Does not apply q1800   Continuous Infusions:   Active Problems:   DIABETES MELLITUS, TYPE II   Cancer of breast   A-fib   Diarrhea   Malnutrition        Marijose Curington  [facility] Pager [phone]. If 7PM-7AM, please contact night-coverage at [email or web address], password [facility] 09/08/2012, 8:56 AM  LOS: 1 day

## 2012-09-08 NOTE — Consult Note (Signed)
Reason for Consult: depression, giving up on treatment and breast cancer Referring Physician: Dr. [name] is an 57 y.o. female.  HPI: Met with patient and her daughter in her room alone and together for psychiatric assessment for depression. Patient was suffering with severe symptoms of depression, feeling sad, over whelming, frustrated, easily irritable and getting upset about family not listening her wishes and pushing towards longer life. She feels that she has been taking family time and too much care and too much emotional burden to family. She stated that she has been depressed about unable to walk to the bathroom, family has to care maximum of her personal needs and does not like the previous placements. She stated that she wants to die to get rid of her family stress. She wants to die with pain free and comfortable. Patient has capacity to make her medical decision and living arrangements.   Patient diagnosed with breast cancer and Patient's daughter is her main caregiver and provides 24 hour care. Patient is "giving up" because she feels guilty that family is having to care for her. Patient does not want blame the family for their expectation from her and at the same time she does not want meet their expectaions. Patient does not like that husband constantly telling her that she will get better and that she will be able to walk one day. This provider validated patient's frustration but encouraged patient to also recognize that family is struggling with patient's decision to end treatment. Patient daughter stated that she has been struggling with her problems but fighting and indirectly expect her mother should fight with her condition instead of giving up. Patient reports that she is not living a quality life and feels it will be best to stop all treatment. Patient appears frustrated throughout assessment. She does not want to talk about situation and wants to be left alone.   MSE:  Patient has fairly cooperative. She has been lying down on her bed with generalized weakness, feeding tube, fluids and taking sips to keep her mouth dry. She feels occasional nausea and refuses to eat. She has depressed and anxious mood and linear and goal directed thought process. She has denied SI/Hi and has no evidence of psychosis. She has fair to poor insight, judgment and impulse control.   Past Medical History  Diagnosis Date  . Depression   . DVT (deep venous thrombosis)     hx of on HRT left leg ~2006  . GERD (gastroesophageal reflux disease)   . Hyperlipidemia   . Hypertension   . Hypothyroidism   . PPD positive, treated     rx inh   . OSA on CPAP   . Diabetes mellitus   . 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 Breast cancer     a. 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 Tubal ligation    . Cholecystectomy    . Foot surgery    . Eye surgery    . Cataract extraction    . Percutaneous liver biopsy    . Breast biopsy      left breast  . Mastectomy modified radical  06/08/2012    Procedure: MASTECTOMY MODIFIED RADICAL;  Surgeon: Mariella Saa, MD;  Location: [facility] OR;  Service: General;  Laterality: Right;  . Portacath  placement  06/08/2012    Procedure: INSERTION PORT-A-CATH;  Surgeon: Mariella Saa, MD;  Location: [facility] OR;  Service: General;  Laterality: Left;  . Peg tube placement      Family History  Problem Relation Age of Onset  . Diabetes Mother   . Hypertension Mother   . Arthritis Mother   . Heart disease Mother   . Heart failure Mother   . Other Mother     benign breast mass  . Stroke Father   . Heart disease Father   . Diabetes Paternal Grandmother   . Colon cancer Paternal Grandfather     Social History:  reports that she has quit smoking. She has never used smokeless  tobacco. She reports that she does not drink alcohol or use illicit drugs.  Allergies:  Allergies  Allergen Reactions  . Olmesartan Medoxomil Cough    REACTION: ? if cough  . Tetracycline Hcl     Unknown reaction, too long for patient to remember   . Venlafaxine     REACTION: severe dry moouth  . Adhesive (Tape) Rash    Medications: I have reviewed the patient's current medications.  Results for orders placed during the hospital encounter of 09/07/12 (from the past 48 hour(s))  CBC WITH DIFFERENTIAL     Status: Abnormal   Collection Time    09/07/12 10:20 AM      Result Value Range   WBC 11.9 (*) 4.0 - 10.5 K/uL   RBC 3.51 (*) 3.87 - 5.11 MIL/uL   Hemoglobin 9.6 (*) 12.0 - 15.0 g/dL   HCT 41.3 (*) 24.4 - 01.0 %   MCV 85.8  78.0 - 100.0 fL   MCH 27.4  26.0 - 34.0 pg   MCHC 31.9  30.0 - 36.0 g/dL   RDW 27.2 (*) 53.6 - 64.4 %   Platelets 249  150 - 400 K/uL   Neutrophils Relative 76  43 - 77 %   Lymphocytes Relative 15  12 - 46 %   Monocytes Relative 9  3 - 12 %   Eosinophils Relative 0  0 - 5 %   Basophils Relative 0  0 - 1 %   Neutro Abs 9.0 (*) 1.7 - 7.7 K/uL   Lymphs Abs 1.8  0.7 - 4.0 K/uL   Monocytes Absolute 1.1 (*) 0.1 - 1.0 K/uL   Eosinophils Absolute 0.0  0.0 - 0.7 K/uL   Basophils Absolute 0.0  0.0 - 0.1 K/uL   RBC Morphology POLYCHROMASIA PRESENT    COMPREHENSIVE METABOLIC PANEL     Status: Abnormal   Collection Time    09/07/12 10:20 AM      Result Value Range   Sodium 130 (*) 135 - 145 mEq/L   Potassium 4.9  3.5 - 5.1 mEq/L   Chloride 94 (*) 96 - 112 mEq/L   CO2 28  19 - 32 mEq/L   Glucose, Bld 108 (*) 70 - 99 mg/dL   BUN 27 (*) 6 - 23 mg/dL   Comment: REPEATED TO VERIFY     DELTA CHECK NOTED   Creatinine, Ser 0.76  0.50 - 1.10 mg/dL   Calcium 8.0 (*) 8.4 - 10.5 mg/dL   Total Protein 5.7 (*) 6.0 - 8.3 g/dL   Albumin 1.6 (*) 3.5 - 5.2 g/dL   AST 40 (*) 0 - 37 U/L   ALT 27  0 - 35 U/L   Alkaline Phosphatase 213 (*) 39 - 117 U/L   Total Bilirubin  0.3  0.3 - 1.2 mg/dL   GFR calc non Af Amer >90  >90 mL/min   GFR calc Af Amer >90  >90 mL/min   Comment:            The eGFR has been calculated     using the CKD EPI equation.     This calculation has not been     validated in all clinical     situations.     eGFR's persistently     <90 mL/min signify     possible Chronic Kidney Disease.  GLUCOSE, CAPILLARY     Status: None   Collection Time    09/07/12  4:17 PM      Result Value Range   Glucose-Capillary 89  70 - 99 mg/dL   Comment 1 Notify RN    PROTIME-INR     Status: Abnormal   Collection Time    09/07/12  4:40 PM      Result Value Range   Prothrombin Time 35.6 (*) 11.6 - 15.2 seconds   INR 3.86 (*) 0.00 - 1.49  GLUCOSE, CAPILLARY     Status: None   Collection Time    09/07/12 10:13 PM      Result Value Range   Glucose-Capillary 91  70 - 99 mg/dL   Comment 1 Documented in Chart     Comment 2 Notify RN    CBC     Status: Abnormal   Collection Time    09/08/12  5:00 AM      Result Value Range   WBC 10.1  4.0 - 10.5 K/uL   RBC 3.30 (*) 3.87 - 5.11 MIL/uL   Hemoglobin 8.9 (*) 12.0 - 15.0 g/dL   HCT 82.9 (*) 56.2 - 13.0 %   MCV 87.0  78.0 - 100.0 fL   MCH 27.0  26.0 - 34.0 pg   MCHC 31.0  30.0 - 36.0 g/dL   RDW 86.5 (*) 78.4 - 69.6 %   Platelets 210  150 - 400 K/uL  COMPREHENSIVE METABOLIC PANEL     Status: Abnormal   Collection Time    09/08/12  5:00 AM      Result Value Range   Sodium 136  135 - 145 mEq/L   Potassium 4.6  3.5 - 5.1 mEq/L   Chloride 101  96 - 112 mEq/L   CO2 29  19 - 32 mEq/L   Glucose, Bld 89  70 - 99 mg/dL   BUN 27 (*) 6 - 23 mg/dL   Creatinine, Ser 2.95  0.50 - 1.10 mg/dL   Calcium 7.8 (*) 8.4 - 10.5 mg/dL   Total Protein 5.5 (*) 6.0 - 8.3 g/dL   Albumin 1.7 (*) 3.5 - 5.2 g/dL   AST 38 (*) 0 - 37 U/L   ALT 27  0 - 35 U/L   Alkaline Phosphatase 180 (*) 39 - 117 U/L   Total Bilirubin 0.4  0.3 - 1.2 mg/dL   GFR calc non Af Amer >90  >90 mL/min   GFR calc Af Amer >90  >90 mL/min    Comment:            The eGFR has been calculated     using the CKD EPI equation.     This calculation has not been     validated in all clinical     situations.     eGFR's persistently     <90 mL/min signify     possible Chronic Kidney Disease.  PROTIME-INR     Status: Abnormal   Collection Time    09/08/12  5:00 AM      Result Value Range   Prothrombin Time 36.7 (*) 11.6 - 15.2 seconds   INR 4.02 (*) 0.00 - 1.49    Ct Head Wo Contrast  09/08/2012  *RADIOLOGY REPORT*  Clinical Data: Lower extremity weakness.  Breast cancer  CT HEAD WITHOUT CONTRAST  Technique:  Contiguous axial images were obtained from the base of the skull through the vertex without contrast.  Comparison: None  Findings: Mild atrophy.  Ventricle size is normal.  Negative for acute or chronic infarct.  Negative for hemorrhage or mass lesion. No skull lesion is identified.  IMPRESSION: No acute abnormality.  Negative for stroke or edema.   Original Report Authenticated By: [name], M.D.     Positive for anorexia, anxiety, bad mood, depression and sleep disturbance Blood pressure 103/51, pulse 65, temperature 97.4 F (36.3 C), temperature source Oral, resp. rate 16, SpO2 97.00%.   Assessment/Plan: Major depression disorder, recurrent Adjustment disorder with severe disturbance of mood and anxiety  Recommendation: Discussed case with psych social service and clinical social worker from palliative care and staff RN. Recommend to increase her lexapro 20 mg Qhs for depression and add Zyprexa 2.5 mg PO Qhs for mood, insomnia and poor appetite. Appreciate psych conultation and follow as needed.  [name],JANARDHAHA R. 09/08/2012, 1:33 PM

## 2012-09-08 NOTE — Telephone Encounter (Signed)
Reviewed chart and patient currently in hospital

## 2012-09-08 NOTE — Progress Notes (Signed)
I have just phoned report to Gilford Rile, RN on our palliative care unit; and we will take her there now.

## 2012-09-08 NOTE — ED Provider Notes (Signed)
Medical screening examination/treatment/procedure(s) were conducted as a shared visit with non-physician practitioner(s) and myself.  I personally evaluated the patient during the encounter.  Patient has metastatic breast cancer.  She is weak and dehydrated. Admit  Donnetta Hutching, MD 09/08/12 2016

## 2012-09-09 ENCOUNTER — Other Ambulatory Visit: Payer: 59 | Admitting: Lab

## 2012-09-09 ENCOUNTER — Encounter (HOSPITAL_COMMUNITY): Payer: Self-pay | Admitting: Physical Medicine and Rehabilitation

## 2012-09-09 ENCOUNTER — Other Ambulatory Visit: Payer: 59

## 2012-09-09 ENCOUNTER — Ambulatory Visit: Payer: 59 | Admitting: Oncology

## 2012-09-09 ENCOUNTER — Ambulatory Visit: Payer: 59 | Admitting: Radiation Oncology

## 2012-09-09 ENCOUNTER — Telehealth: Payer: Self-pay | Admitting: Family

## 2012-09-09 ENCOUNTER — Ambulatory Visit: Payer: 59

## 2012-09-09 DIAGNOSIS — M47816 Spondylosis without myelopathy or radiculopathy, lumbar region: Secondary | ICD-10-CM | POA: Insufficient documentation

## 2012-09-09 DIAGNOSIS — R5381 Other malaise: Secondary | ICD-10-CM

## 2012-09-09 LAB — PROTIME-INR: INR: 4.27 — ABNORMAL HIGH (ref 0.00–1.49)

## 2012-09-09 LAB — GLUCOSE, CAPILLARY: Glucose-Capillary: 99 mg/dL (ref 70–99)

## 2012-09-09 MED ORDER — WARFARIN - PHARMACIST DOSING INPATIENT: Freq: Every day | Status: DC

## 2012-09-09 MED ORDER — ADULT MULTIVITAMIN W/MINERALS CH
1.0000 | ORAL_TABLET | Freq: Every day | ORAL | Status: DC
  Administered 2012-09-09 – 2012-09-13 (×5): 1 via ORAL
  Filled 2012-09-09 (×5): qty 1

## 2012-09-09 MED ORDER — FREE WATER
60.0000 mL | Freq: Two times a day (BID) | Status: DC
  Administered 2012-09-09: 60 mL

## 2012-09-09 MED ORDER — WARFARIN SODIUM 5 MG PO TABS: 5.0000 mg | ORAL_TABLET | ORAL | Status: DC

## 2012-09-09 MED ORDER — JEVITY 1.2 CAL PO LIQD
237.0000 mL | Freq: Two times a day (BID) | ORAL | Status: DC
  Administered 2012-09-09 (×2): 237 mL
  Filled 2012-09-09: qty 237

## 2012-09-09 NOTE — Telephone Encounter (Signed)
Called patient to inform her of appointment tomorrow but patients husband Dorinda Hill says that patient is in the hospital right now. He says that patient is requesting to go to a rehab facility to help her get her strength back up. Dorinda Hill says that Wyman Songster from [employer] is requesting a form from [employer] regarding long term disability. Brenda's phone# is [phone].

## 2012-09-09 NOTE — Progress Notes (Signed)
Patient ZO:XWRUE F Burley      DOB: [birth year]      AVW:[id number]   Palliative Medicine Team at [facility] Progress Note    Subjective:  Clothilde has a slight brighter affect this am but still a little flat.  She states "did they tell you I am going to do rehab" .  Patient states that she is feeling better and wants to "live" .  She had a good session with Cammy Copa and [place] yesterday, and she agreed to let the psychiatrist treat her symptoms of depression and anxiety.  She relates that she is going to try to eat this morning.  Filed Vitals:   09/09/12 0620  BP: 129/57  Pulse: 80  Temp: 98.1 F (36.7 C)  Resp: 14   Physical exam:  Generally in better spirits . Affect remains generally flat but she did smile twice for me today. [place], PERRL, EOMI, mm dry Chest: decreased but clear Cvs: regular, rate and rhythm s1, s2 abd soft non tentder, positive bowel sounds Ext heel protectors in place left heel. Neuro: perseverating on cost of treatment but not as fixated on dying today.   Assessment and plan:  57 year old white female with breast cancer admitted for generalized weakness and diarrhea. The patient was refusing therapy initially stating that she did not wish to live. After indicating with social work and psychiatry and adjustment in her medication she now has a more positive outlook on her life and is willing to take her medications, to eat and to participate in rehabilitation .  1.  DO NOT RESUSCITATE DO NOT INTUBATE   2. Depression with anxiety: Currently responding to Zyprexa and antidepressants and intensive emotional support.    3. Lower back pain and leg weakness: Patient states improved today and one to work with rehabilitation       Total time 15 minutes   Izekiel Flegel L. Ladona Ridgel, MD MBA The Palliative Medicine Team at [facility] Phone: [phone] Pager: [phone]
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
## 2012-09-09 NOTE — Progress Notes (Signed)
[name]   DOB:01/23/1956   WU#:[id number]   GNF#:[id number]  Subjective: [name] has had no diarrhea in 2 days. She denies pain. She is beginning to understand that she is not dying. She "does not want to fight." However, when I asked her what she does want, she says she wants to walk. She is agreeable to resuming PEG feeds. She is interested in Rehab. Family in room  Objective: middle aged white woman examined in bed, using C-pap Filed Vitals:   09/09/12 0620  BP: 129/57  Pulse: 80  Temp: 98.1 F (36.7 C)  Resp: 14    Body mass index is 37.06 kg/(m^2).  Intake/Output Summary (Last 24 hours) at 09/09/12 0819 Last data filed at 09/09/12 0620  Gross per 24 hour  Intake      0 ml  Output   1775 ml  Net  -1775 ml     Oropharynx dry, lips peeled  No cervical or supraclavicular adenomapthy  Lungs clear -- auscultated anteriorly  Heart regular, no murmur heard  Abdomen soft, +BS.  MSK LE edema as before  Neuro nonfocal, well oriented, guarded affect  Breast exam: deferred  CBG (last 3)   Recent Labs  09/07/12 1617 09/07/12 2213 09/08/12 1713  GLUCAP 89 91 77     Labs:  Lab Results  Component Value Date   WBC 10.1 09/08/2012   HGB 8.9* 09/08/2012   HCT 28.7* 09/08/2012   MCV 87.0 09/08/2012   PLT 210 09/08/2012   NEUTROABS 9.0* 09/07/2012    @LASTCHEMISTRY @  Urine Studies No results found for this basename: UACOL, UAPR, USPG, UPH, UTP, UGL, UKET, UBIL, UHGB, UNIT, UROB, ULEU, UEPI, UWBC, URBC, UBAC, CAST, CRYS, UCOM, BILUA,  in the last 72 hours  Basic Metabolic Panel:  Recent Labs Lab 09/03/12 0400 09/04/12 0503 09/05/12 0606 09/06/12 0500 09/07/12 1020 09/08/12 0500  NA 133* 133* 134* 133* 130* 136  K 3.3* 3.5 3.8 4.5 4.9 4.6  CL 96 94* 98 98 94* 101  CO2 36* 33* 33* 32 28 29  GLUCOSE 88 95 96 103* 108* 89  BUN 16 16 15 16  27* 27*  CREATININE 0.60 0.57 0.56 0.59 0.76 0.75  CALCIUM 7.5* 7.6* 7.6* 7.6* 8.0* 7.8*  MG 1.8  --   --   --   --   --     GFR Estimated Creatinine Clearance: 87.1 ml/min (by C-G formula based on Cr of 0.75). Liver Function Tests:  Recent Labs Lab 09/07/12 1020 09/08/12 0500  AST 40* 38*  ALT 27 27  ALKPHOS 213* 180*  BILITOT 0.3 0.4  PROT 5.7* 5.5*  ALBUMIN 1.6* 1.7*   No results found for this basename: LIPASE, AMYLASE,  in the last 168 hours No results found for this basename: AMMONIA,  in the last 168 hours Coagulation profile  Recent Labs Lab 09/05/12 0606 09/06/12 0500 09/07/12 1640 09/08/12 0500 09/09/12 0615  INR 1.93* 2.19* 3.86* 4.02* 4.27*    CBC:  Recent Labs Lab 09/04/12 0503 09/05/12 0606 09/06/12 0500 09/07/12 1020 09/08/12 0500  WBC 8.9 6.4 9.8 11.9* 10.1  NEUTROABS  --   --   --  9.0*  --   HGB 10.4* 9.6* 9.7* 9.6* 8.9*  HCT 31.9* 29.7* 30.3* 30.1* 28.7*  MCV 85.5 85.3 86.1 85.8 87.0  PLT 217 198 233 249 210   Cardiac Enzymes: No results found for this basename: CKTOTAL, CKMB, CKMBINDEX, TROPONINI,  in the last 168 hours BNP: No components found with this  basename: POCBNP,  CBG:  Recent Labs Lab 09/06/12 0725 09/06/12 1146 09/07/12 1617 09/07/12 2213 09/08/12 1713  GLUCAP 103* 135* 89 91 77   D-Dimer No results found for this basename: DDIMER,  in the last 72 hours Hgb A1c No results found for this basename: HGBA1C,  in the last 72 hours Lipid Profile No results found for this basename: CHOL, HDL, LDLCALC, TRIG, CHOLHDL, LDLDIRECT,  in the last 72 hours Thyroid function studies No results found for this basename: TSH, T4TOTAL, FREET3, T3FREE, THYROIDAB,  in the last 72 hours Anemia work up No results found for this basename: VITAMINB12, FOLATE, FERRITIN, TIBC, IRON, RETICCTPCT,  in the last 72 hours Microbiology No results found for this or any previous visit (from the past 240 hour(s)).    Studies:  Ct Head Wo Contrast  09/08/2012  *RADIOLOGY REPORT*  Clinical Data: Lower extremity weakness.  Breast cancer  CT HEAD WITHOUT CONTRAST   Technique:  Contiguous axial images were obtained from the base of the skull through the vertex without contrast.  Comparison: None  Findings: Mild atrophy.  Ventricle size is normal.  Negative for acute or chronic infarct.  Negative for hemorrhage or mass lesion. No skull lesion is identified.  IMPRESSION: No acute abnormality.  Negative for stroke or edema.   Original Report Authenticated By: [name], M.D.       Assessment: 57 y.o. [name] woman admitted with A Fib/ RVR, readmitted with severe diarrhea, poorly controlled pain and nausea  (1) status post right mastectomy under the care of Dr. 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 Lymphovascular perineural invasion was identified. 2 of 18 lymph nodes were involved. Margins were clear. Pathologic staging pT3, pN1a, stage IIIA.   (2) treated in the adjuvant setting with docetaxel/cyclophosphamide given every 3 weeks, with Neulasta on day 2 for granulocyte support. First treatment was given on 07/06/2012. Due to intolerance, we decreased her doses by 15% beginning cycle 2, given 07/29/2012. However, tolerance remained poor and no further chemotherapy is planned   (3) patient will need postmastectomy radiation--to see Dr Michell Heinrich 09/09/2012   (4) letrozole started April 2014, plan is to continue for 5 years;  (4) comorbidities include diabetes, hypertension, chronic liver disease, and c diff negative diarrhea..  (5) malnutrition: s/p PEG placement  (6) end of life issues: patient refusing feedings, medications; DNR in place    Plan: .With more support, resolution of the diarrhea, and less/ no pain, [name] seems more wiling to give it a try. I am going to transfer her to my service to better coordinate care--a big THANKS to the Hospitalist service!--and we will request a Rehab eval. Will  resume bolus feedings but very gradually (do not want to restart diarrhea). Will simplify meds as much as possible.   MAGRINAT,GUSTAV C 09/09/2012

## 2012-09-09 NOTE — Consult Note (Signed)
Reason for Consult: Major depression disorder, recurrent , Adjustment disorder with severe disturbance of mood and anxiety  Referring Physician: Dr. Ines Bloomer is an 57 y.o. female.  HPI: Patient was seen and case discussed with the staff knows patient and daughter her husband and social worker the patient has wants to either spondylitic to the current medication management and change her mind regarding getting up her treatment the patient stated I want to live, I have a lot to live for including my 2 grandchildren. Patient stated she wanted to get all her service back that she can to get and patient is able to drink clear liquids and able to cooperate with the staff nurse regarding taking her medications.  MSE: Patient has a better mood today than yesterday looking pause to to get treatment and services. Patient staff and family were able to appreciate patient cooperation regarding further treatment. Patient stated that she does not want to die she wanted to live. She as as endorsed depressed mood and anxious affect. She has a normal speech and thought process he has no evidence of psychosis.   Past Medical History  Diagnosis Date  . Depression   . DVT (deep venous thrombosis)     hx of on HRT left leg ~2006  . GERD (gastroesophageal reflux disease)   . Hyperlipidemia   . Hypertension   . Hypothyroidism   . PPD positive, treated     rx inh   . OSA on CPAP   . Diabetes mellitus   . 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 Breast cancer     a. 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 DJD (degenerative joint disease) of lumbar spine     Past Surgical History  Procedure Laterality Date  . Tubal ligation    . Cholecystectomy    . Foot surgery    . Eye surgery    . Cataract extraction    . Percutaneous liver biopsy    . Breast biopsy      left breast  .  Mastectomy modified radical  06/08/2012    Procedure: MASTECTOMY MODIFIED RADICAL;  Surgeon: Mariella Saa, MD;  Location: [facility] OR;  Service: General;  Laterality: Right;  . Portacath placement  06/08/2012    Procedure: INSERTION PORT-A-CATH;  Surgeon: Mariella Saa, MD;  Location: [facility] OR;  Service: General;  Laterality: Left;  . Peg tube placement      Family History  Problem Relation Age of Onset  . Diabetes Mother   . Hypertension Mother   . Arthritis Mother   . Heart disease Mother   . Heart failure Mother   . Other Mother     benign breast mass  . Stroke Father   . Heart disease Father   . Diabetes Paternal Grandmother   . Colon cancer Paternal Grandfather     Social History:  reports that she has quit smoking. She has never used smokeless tobacco. She reports that she does not drink alcohol or use illicit drugs.  Allergies:  Allergies  Allergen Reactions  . Olmesartan Medoxomil Cough    REACTION: ? if cough  . Tetracycline Hcl     Unknown reaction, too long for patient to remember   . Venlafaxine     REACTION: severe dry moouth  . Adhesive (Tape) Rash    Medications: I have reviewed  the patient's current medications.  Results for orders placed during the hospital encounter of 09/07/12 (from the past 48 hour(s))  GLUCOSE, CAPILLARY     Status: None   Collection Time    09/07/12 10:13 PM      Result Value Range   Glucose-Capillary 91  70 - 99 mg/dL   Comment 1 Documented in Chart     Comment 2 Notify RN    CBC     Status: Abnormal   Collection Time    09/08/12  5:00 AM      Result Value Range   WBC 10.1  4.0 - 10.5 K/uL   RBC 3.30 (*) 3.87 - 5.11 MIL/uL   Hemoglobin 8.9 (*) 12.0 - 15.0 g/dL   HCT 16.1 (*) 09.6 - 04.5 %   MCV 87.0  78.0 - 100.0 fL   MCH 27.0  26.0 - 34.0 pg   MCHC 31.0  30.0 - 36.0 g/dL   RDW 40.9 (*) 81.1 - 91.4 %   Platelets 210  150 - 400 K/uL  COMPREHENSIVE METABOLIC PANEL     Status: Abnormal   Collection Time    09/08/12   5:00 AM      Result Value Range   Sodium 136  135 - 145 mEq/L   Potassium 4.6  3.5 - 5.1 mEq/L   Chloride 101  96 - 112 mEq/L   CO2 29  19 - 32 mEq/L   Glucose, Bld 89  70 - 99 mg/dL   BUN 27 (*) 6 - 23 mg/dL   Creatinine, Ser 7.82  0.50 - 1.10 mg/dL   Calcium 7.8 (*) 8.4 - 10.5 mg/dL   Total Protein 5.5 (*) 6.0 - 8.3 g/dL   Albumin 1.7 (*) 3.5 - 5.2 g/dL   AST 38 (*) 0 - 37 U/L   ALT 27  0 - 35 U/L   Alkaline Phosphatase 180 (*) 39 - 117 U/L   Total Bilirubin 0.4  0.3 - 1.2 mg/dL   GFR calc non Af Amer >90  >90 mL/min   GFR calc Af Amer >90  >90 mL/min   Comment:            The eGFR has been calculated     using the CKD EPI equation.     This calculation has not been     validated in all clinical     situations.     eGFR's persistently     <90 mL/min signify     possible Chronic Kidney Disease.  PROTIME-INR     Status: Abnormal   Collection Time    09/08/12  5:00 AM      Result Value Range   Prothrombin Time 36.7 (*) 11.6 - 15.2 seconds   INR 4.02 (*) 0.00 - 1.49  GLUCOSE, CAPILLARY     Status: None   Collection Time    09/08/12  5:13 PM      Result Value Range   Glucose-Capillary 77  70 - 99 mg/dL   Comment 1 Notify RN    PROTIME-INR     Status: Abnormal   Collection Time    09/09/12  6:15 AM      Result Value Range   Prothrombin Time 38.4 (*) 11.6 - 15.2 seconds   INR 4.27 (*) 0.00 - 1.49  GLUCOSE, CAPILLARY     Status: None   Collection Time    09/09/12  1:49 PM      Result Value Range   Glucose-Capillary  78  70 - 99 mg/dL   Comment 1 Documented in Chart     Comment 2 Notify RN      Ct Head Wo Contrast  09/08/2012  *RADIOLOGY REPORT*  Clinical Data: Lower extremity weakness.  Breast cancer  CT HEAD WITHOUT CONTRAST  Technique:  Contiguous axial images were obtained from the base of the skull through the vertex without contrast.  Comparison: None  Findings: Mild atrophy.  Ventricle size is normal.  Negative for acute or chronic infarct.  Negative for  hemorrhage or mass lesion. No skull lesion is identified.  IMPRESSION: No acute abnormality.  Negative for stroke or edema.   Original Report Authenticated By: [name], M.D.     Positive for anxiety, bad mood, depression, separation anxiety and sleep disturbance Blood pressure 129/57, pulse 80, temperature 98.1 F (36.7 C), temperature source Axillary, resp. rate 14, height 5\' 4"  (1.626 m), weight 216 lb (97.977 kg), SpO2 97.00%.   Assessment/Plan: Continue current treatment and medication management. Case discussed with the psychiatric and social service and who will be visiting her today for further assistance.  Rozina Pointer,JANARDHAHA R. 09/09/2012, 6:21 PM
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
## 2012-09-09 NOTE — Progress Notes (Signed)
ANTICOAGULATION CONSULT NOTE - Follow Up Consult  Pharmacy Consult for Coumadin Indication: Atrial Fibrillation  Allergies  Allergen Reactions  . Olmesartan Medoxomil Cough    REACTION: ? if cough  . Tetracycline Hcl     Unknown reaction, too long for patient to remember   . Venlafaxine     REACTION: severe dry moouth  . Adhesive (Tape) Rash     Recent Labs  09/07/12 1020 09/07/12 1640 09/08/12 0500 09/09/12 0615  HGB 9.6*  --  8.9*  --   HCT 30.1*  --  28.7*  --   PLT 249  --  210  --   LABPROT  --  35.6* 36.7* 38.4*  INR  --  3.86* 4.02* 4.27*  CREATININE 0.76  --  0.75  --     Estimated Creatinine Clearance: 87.1 ml/min (by C-G formula based on Cr of 0.75).   Assessment: 20 yoF recently hospitalized for electrolyte abnormalities, malnutrition, severe deconditioning and atrial fibrillation, discharged 4/28 but readmitted 4/29 with severe diarrhea, uncontrolled pain, and nausea.  PMHx significant for atrial fibrillation started on anticoagulation during last admission, chronic liver disease with elevated INR at baseline, hx breast cancer s/p mastectomy 05/2012, 2 cycles chemotherapy with no further chemotherapy plans, and malnutrition with recent PEG placement.  Warfarin resumed per pharmacy.    INR supratherapeutic (4.27) and rising despite held coumadin, last dose 4/27, Likely secondary to Poor PO intake  Pt refusing to take PO meds, refusing to eat.    CBC okay   Goal of Therapy:  INR 2-3 Monitor platelets by anticoagulation protocol: Yes   Plan:  Hold coumadin tonight.   F/u daily PT/INR.    Westly Hinnant, Loma Messing PharmD Pager #: [phone] 10:50 AM 09/09/2012

## 2012-09-09 NOTE — Progress Notes (Addendum)
Noted inpatient rehab consult is pending. PT and OT will need to be ordered to assist with any planning of rehab venue at this time. Please advise of activity orders and order therapy evaluations. I will follow . [phone]

## 2012-09-09 NOTE — Consult Note (Signed)
Physical Medicine and Rehabilitation Consult  Reason for Consult: Deconditioning due to multiple medical issues  Referring Physician: Dr. [name]   HPI: [name] is a 57 y.o. female with history of breast cancer, Atrial fibrillation, malnutrition requiring PEG 4/18, multiple recent admissions who was discharged to home 09/06/12. Family reported difficulty getting patient out of the car and into the home due to inability walk and BLE weakness. She was readmitted on 04/29 with severely depressed mood, complaints of weakness and refusal of continuing any further interventions. Palliative care and psychiatary consulted for assistance and patient was agreeable to start medications to help with mood stabilization as well as continued medical interventions. She is to start XRT today? MD and family requesting CIR.   Husband states that as recently as 5 days ago, the patient was walking over 200 feet with the therapist. Has not had any PT or OT since this admission   Review of Systems  Constitutional: Positive for malaise/fatigue.  HENT: Negative for hearing loss.   Eyes: Negative for blurred vision and double vision.  Respiratory: Positive for shortness of breath (with activity). Negative for cough.   Cardiovascular: Negative for chest pain and palpitations.  Gastrointestinal: Negative for nausea, vomiting, abdominal pain and diarrhea.  Musculoskeletal: Positive for myalgias.       Buttock pain due to decub.  Neurological: Positive for weakness. Negative for dizziness, tingling and headaches.  Psychiatric/Behavioral: Positive for depression. The patient is nervous/anxious and has insomnia.    Past Medical History  Diagnosis Date  . Depression   . DVT (deep venous thrombosis)     hx of on HRT left leg ~2006  . GERD (gastroesophageal reflux disease)   . Hyperlipidemia   . Hypertension   . Hypothyroidism   . PPD positive, treated     rx inh   . OSA on CPAP   . Diabetes mellitus   .  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 Breast cancer     a. 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 Tubal ligation    . Cholecystectomy    . Foot surgery    . Eye surgery    . Cataract extraction    . Percutaneous liver biopsy    . Breast biopsy      left breast  . Mastectomy modified radical  06/08/2012    Procedure: MASTECTOMY MODIFIED RADICAL;  Surgeon: Mariella Saa, MD;  Location: [facility] OR;  Service: General;  Laterality: Right;  . Portacath placement  06/08/2012    Procedure: INSERTION PORT-A-CATH;  Surgeon: Mariella Saa, MD;  Location: [facility] OR;  Service: General;  Laterality: Left;  . Peg tube placement     Family History  Problem Relation Age of Onset  . Diabetes Mother   . Hypertension Mother   . Arthritis Mother   . Heart disease Mother   . Heart failure Mother   . Other Mother     benign breast mass  . Stroke Father   . Heart disease Father   . Diabetes Paternal Grandmother   . Colon cancer Paternal Grandfather    Social History:  Married. Has a supportive family and daughter who can provide assistance past discharge. Per reports that she has quit smoking. She has never used smokeless tobacco. She reports that she does not drink alcohol  or use illicit drugs.   Allergies  Allergen Reactions  . Olmesartan Medoxomil Cough    REACTION: ? if cough  . Tetracycline Hcl     Unknown reaction, too long for patient to remember   . Venlafaxine     REACTION: severe dry moouth  . Adhesive (Tape) Rash   Medications Prior to Admission  Medication Sig Dispense Refill  . amiodarone (PACERONE) 200 MG tablet Take 2 tablets (400 mg total) by mouth 2 (two) times daily. Take 2 tablets (400mg ) 2 times daily x 4 days, then 1 tablet (200mg ) daily  60 tablet  0  . antiseptic oral rinse (BIOTENE) LIQD 15  mLs by Mouth Rinse route 2 (two) times daily as needed.  100 mL  3  . aspirin EC 81 MG tablet Take 81 mg by mouth every morning.      [name] atorvastatin (LIPITOR) 10 MG tablet Take 10 mg by mouth at bedtime.   90 tablet  1  . buPROPion (WELLBUTRIN SR) 150 MG 12 hr tablet Take 150 mg by mouth every morning.       . cholestyramine (QUESTRAN) 4 G packet Take 1 packet (4 g total) by mouth 2 (two) times daily.  60 each  3  . escitalopram (LEXAPRO) 10 MG tablet Take 1 tablet (10 mg total) by mouth at bedtime.  31 tablet  0  . feeding supplement (PRO-STAT SUGAR FREE 64) LIQD Place 30 mLs into feeding tube 3 (three) times daily with meals.  900 mL  0  . feeding supplement (RESOURCE BREEZE) LIQD Take 1 Container by mouth 2 (two) times daily between meals.  60 Container  3  . furosemide (LASIX) 80 MG tablet Take 1 tablet (80 mg total) by mouth 2 (two) times daily.  62 tablet  0  . insulin aspart (NOVOLOG) 100 UNIT/ML injection Inject 0-15 Units into the skin 3 (three) times daily with meals.  1 vial  3  . letrozole (FEMARA) 2.5 MG tablet Take 1 tablet (2.5 mg total) by mouth daily.  30 tablet  3  . levothyroxine (SYNTHROID, LEVOTHROID) 125 MCG tablet Take 125 mcg by mouth daily before breakfast.      . lidocaine-prilocaine (EMLA) cream Apply 1 application topically as needed (for port access).      . megestrol (MEGACE) 400 MG/10ML suspension Take 10 mLs (400 mg total) by mouth 2 (two) times daily.  480 mL  0  . metoprolol tartrate (LOPRESSOR) 25 MG tablet Take 3 tablets (75 mg total) by mouth 2 (two) times daily.  180 tablet  0  . Multiple Vitamin (MULTIVITAMIN WITH MINERALS) TABS Take 1 tablet by mouth daily.  30 tablet  3  . Nutritional Supplements (FEEDING SUPPLEMENT, JEVITY 1.2 CAL,) LIQD Place 360 mLs into feeding tube 4 (four) times daily.  5000 mL  0  . omeprazole (PRILOSEC) 20 MG capsule Take 1 capsule (20 mg total) by mouth 2 (two) times daily.  180 capsule  2  . ondansetron (ZOFRAN ODT) 4 MG  disintegrating tablet Take 1 tablet (4 mg total) by mouth every 8 (eight) hours as needed for nausea.  20 tablet  0  . oxyCODONE-acetaminophen (PERCOCET/ROXICET) 5-325 MG per tablet Take 1-2 tablets by mouth every 4 (four) hours as needed for pain.  20 tablet  0  . potassium chloride (K-DUR) 10 MEQ tablet Take 1 tablet (10 mEq total) by mouth daily.  30 tablet  0  . saccharomyces boulardii (FLORASTOR) 250 MG capsule Take 1 capsule (  250 mg total) by mouth 2 (two) times daily.  60 capsule  1  . sodium chloride 0.9 % injection 10-40 mLs by Intracatheter route as needed (flush).  5 mL  3  . warfarin (COUMADIN) 4 MG tablet Take 1 tablet (4 mg total) by mouth one time only at 6 PM.  30 tablet  0  . Water For Irrigation, Sterile (FREE WATER) SOLN Place 160 mLs into feeding tube 4 (four) times daily - after meals and at bedtime.        Home:    Functional History:   Functional Status:  Mobility:          ADL:    Cognition: Cognition Orientation Level: Oriented X4    Blood pressure 129/57, pulse 80, temperature 98.1 F (36.7 C), temperature source Axillary, resp. rate 14, height 5\' 4"  (1.626 m), weight 97.977 kg (216 lb), SpO2 97.00%. Physical Exam  Nursing note and vitals reviewed. Constitutional: She is oriented to person, place, and time. She appears well-developed.  HENT:  Head: Normocephalic and atraumatic.  Eyes: Pupils are equal, round, and reactive to light.  Neck: Normal range of motion.  Cardiovascular: Normal rate and regular rhythm.   Pulmonary/Chest: Effort normal and breath sounds normal.  Abdominal: Soft. Bowel sounds are normal. She exhibits no distension. There is no tenderness.  PEG in place  Musculoskeletal: She exhibits edema (BLE with minimal edema.).  Foam dressing bilateral feet.   Neurological: She is alert and oriented to person, place, and time.  Flat affect. Follows commands without difficulty. Diffusely weak.   Motor: 4/5 in bilateral deltoid, biceps,  triceps, grip 3 minus/5 in bilateral hip flexor knee extensor ankle dorsiflexor and plantar flexor 1+ edema bilateral pretibial and dorsum of foot. Sensory to light touch is intact in the upper and lower limbs Speech is without evidence of dysarthria or aphasia.  Results for orders placed during the hospital encounter of 09/07/12 (from the past 24 hour(s))  GLUCOSE, CAPILLARY     Status: None   Collection Time    09/08/12  5:13 PM      Result Value Range   Glucose-Capillary 77  70 - 99 mg/dL   Comment 1 Notify RN    PROTIME-INR     Status: Abnormal   Collection Time    09/09/12  6:15 AM      Result Value Range   Prothrombin Time 38.4 (*) 11.6 - 15.2 seconds   INR 4.27 (*) 0.00 - 1.49   Ct Head Wo Contrast  09/08/2012  *RADIOLOGY REPORT*  Clinical Data: Lower extremity weakness.  Breast cancer  CT HEAD WITHOUT CONTRAST  Technique:  Contiguous axial images were obtained from the base of the skull through the vertex without contrast.  Comparison: None  Findings: Mild atrophy.  Ventricle size is normal.  Negative for acute or chronic infarct.  Negative for hemorrhage or mass lesion. No skull lesion is identified.  IMPRESSION: No acute abnormality.  Negative for stroke or edema.   Original Report Authenticated By: [name], M.D.     Assessment/Plan: Diagnosis: Deconditioning related to breast carcinoma and malnutrition 1. Does the need for close, 24 hr/day medical supervision in concert with the patient's rehab needs make it unreasonable for this patient to be served in a less intensive setting? Yes 2. Co-Morbidities requiring supervision/potential complications: A fib, depression , HTN 3. Due to bladder management, bowel management, safety, skin/wound care, disease management, medication administration and patient education, does the patient require 24 hr/day rehab  nursing? Potentially 4. Does the patient require coordinated care of a physician, rehab nurse, PT (1-2 hrs/day, 5 days/week)  and OT (1-2 hrs/day, 5 days/week) to address physical and functional deficits in the context of the above medical diagnosis(es)? Potentially Addressing deficits in the following areas: balance, endurance, locomotion, strength, transferring, bowel/bladder control, bathing, dressing, feeding, grooming, toileting and psychosocial support 5. Can the patient actively participate in an intensive therapy program of at least 3 hrs of therapy per day at least 5 days per week? Potentially 6. The potential for patient to make measurable gains while on inpatient rehab is needs PT OT eval to further assess 7. Anticipated functional outcomes upon discharge from inpatient rehab are Pending eval with PT, Pending eval with OT, Not applicable with SLP. 8. Estimated rehab length of stay to reach the above functional goals is: Pending evaluation by PT OT 9. Does the patient have adequate social supports to accommodate these discharge functional goals? Potentially 10. Anticipated D/C setting: Home 11. Anticipated post D/C treatments: HH therapy 12. Overall Rehab/Functional Prognosis: fair  RECOMMENDATIONS: This patient's condition is appropriate for continued rehabilitative care in the following setting: Will need PT and OT evaluations. We'll need to determine current functional status as well as ability to tolerate therapy. Patient has agreed to participate in recommended program. Potentially Note that insurance prior authorization may be required for reimbursement for recommended care.  Comment:    09/09/2012

## 2012-09-09 NOTE — Evaluation (Signed)
Physical Therapy Evaluation Patient Details Name: [name] MRN: [id number] DOB: 1955-08-18 Today's Date: 09/09/2012 Time: 1478-2956 PT Time Calculation (min): 36 min  PT Assessment / Plan / Recommendation Clinical Impression  [name] is a 57 y.o. female with history of breast cancer, Atrial fibrillation, malnutrition requiring PEG 4/18, multiple recent admissions who was discharged to home 09/06/12. Family reported difficulty getting patient out of the car and into the home due to inability walk and BLE weakness. She was readmitted on 04/29 with severely depressed mood, complaints of weakness and refusal of continuing any further interventions. Palliative care and psychiatary consulted for assistance and patient was agreeable to start medications to help with mood stabilization as well as continued medical interventions. She is to start XRT today? MD and family requesting CIR.  Pt is motivated  to participate with PT  this date;  Pt  would benefit from continued PT to improve independence  and safety for home with family.    PT Assessment  Patient needs continued PT services    Follow Up Recommendations  CIR    Does the patient have the potential to tolerate intense rehabilitation      Barriers to Discharge        Equipment Recommendations  None recommended by PT    Recommendations for Other Services     Frequency Min 3X/week    Precautions / Restrictions Precautions Precautions: Fall Precaution Comments: Monitor HR   Pertinent Vitals/Pain 0/10     Mobility  Bed Mobility Bed Mobility: Supine to Sit Supine to Sit: 1: +2 Total assist Supine to Sit: Patient Percentage: 40% Sitting - Scoot to Edge of Bed: 3: Mod assist Details for Bed Mobility Assistance: +2 for UB, safety, lines;  Transfers Transfers: Sit to Stand;Stand to Sit Sit to Stand: 1: +2 Total assist;From bed;Without upper extremity assist Stand to Sit: Patient Percentage: 70% Stand Pivot Transfers:  Patient Percentage: 70% Details for Transfer Assistance: verbal cues for hand placement and to control Ambulation/Gait Ambulation Distance (Feet): 10 Feet Assistive device: Rolling walker Ambulation/Gait Assistance Details: +2 for safety, lines, balance; HR 80s; O2 sats upper90s on RA; Pt with  significant dyspnea on exertion Gait Pattern: Step-through pattern;Decreased stride length;Narrow base of support    Exercises     PT Diagnosis: Difficulty walking;Generalized weakness  PT Problem List: Decreased strength;Decreased range of motion;Decreased activity tolerance;Decreased balance;Decreased mobility;Decreased knowledge of use of DME;Decreased safety awareness;Decreased knowledge of precautions;Cardiopulmonary status limiting activity PT Treatment Interventions: DME instruction;Gait training;Functional mobility training;Therapeutic activities;Stair training;Therapeutic exercise;Balance training;Patient/family education   PT Goals    Visit Information  Last PT Received On: 09/09/12 Assistance Needed: +2    Subjective Data  Subjective: you wouldn't believe that nursing home  Patient Stated Goal: get better to walk   Prior Functioning  Home Living Lives With: Spouse;Family Available Help at Discharge: Family;Available 24 hours/day Type of Home: House Home Access: Stairs to enter [employer] of Steps: plans to get ramp Home Layout: One level Home Adaptive Equipment: Wheelchair - manual;Bedside commode/3-in-1;Walker - four wheeled;Shower chair with back    Cognition  Cognition Arousal/Alertness: Awake/alert Behavior During Therapy: WFL for tasks assessed/performed Overall Cognitive Status: Within Functional Limits for tasks assessed    Extremity/Trunk Assessment Right Upper Extremity Assessment RUE ROM/Strength/Tone Deficits: strength grossly 4/5 Left Upper Extremity Assessment LUE ROM/Strength/Tone Deficits: strength grossly 4/5 Right Lower Extremity  Assessment RLE ROM/Strength/Tone Deficits: hip flex, knee flex/ext and ankle DF grossly 3/5 Left Lower Extremity Assessment LLE ROM/Strength/Tone Deficits: hip  flex, knee flex/ext and ankle DF grossly 3/5   Balance Static Sitting Balance Static Sitting - Balance Support: Bilateral upper extremity supported;Feet supported Static Sitting - Level of Assistance: 4: Min assist;3: Mod assist;5: Stand by assistance Static Sitting - Comment/# of Minutes: posterior LOB  End of Session PT - End of Session Activity Tolerance: Patient tolerated treatment well;Patient limited by fatigue Patient left: in chair;with call bell/phone within reach;with family/visitor present  GP     [facility] 09/09/2012, 4:46 PM

## 2012-09-09 NOTE — Progress Notes (Signed)
Spoke with daughter and another family member this morning. They are happy that the news is "good" and that treatments have resumed. They are planning for their mother's recovery and her ability to go back to the life she had before she was so sick.

## 2012-09-10 ENCOUNTER — Ambulatory Visit: Payer: 59 | Admitting: Family

## 2012-09-10 ENCOUNTER — Ambulatory Visit: Payer: 59

## 2012-09-10 DIAGNOSIS — E119 Type 2 diabetes mellitus without complications: Secondary | ICD-10-CM

## 2012-09-10 LAB — PROTIME-INR: INR: 3.38 — ABNORMAL HIGH (ref 0.00–1.49)

## 2012-09-10 LAB — GLUCOSE, CAPILLARY: Glucose-Capillary: 111 mg/dL — ABNORMAL HIGH (ref 70–99)

## 2012-09-10 MED ORDER — VITAMINS A & D EX OINT
TOPICAL_OINTMENT | CUTANEOUS | Status: AC
  Administered 2012-09-10: 09:00:00
  Filled 2012-09-10: qty 5

## 2012-09-10 MED ORDER — AMIODARONE HCL 200 MG PO TABS
400.0000 mg | ORAL_TABLET | Freq: Two times a day (BID) | ORAL | Status: DC
  Administered 2012-09-10 – 2012-09-13 (×7): 400 mg via ORAL
  Filled 2012-09-10 (×8): qty 2

## 2012-09-10 MED ORDER — LOPERAMIDE HCL 2 MG PO CAPS
2.0000 mg | ORAL_CAPSULE | Freq: Four times a day (QID) | ORAL | Status: DC | PRN
  Administered 2012-09-10: 2 mg via ORAL

## 2012-09-10 MED ORDER — CHOLESTYRAMINE 4 G PO PACK
4.0000 g | PACK | Freq: Two times a day (BID) | ORAL | Status: DC | PRN
  Filled 2012-09-10: qty 1

## 2012-09-10 MED ORDER — FREE WATER
60.0000 mL | Freq: Every day | Status: DC
  Administered 2012-09-10 – 2012-09-13 (×16): 60 mL

## 2012-09-10 MED ORDER — ONDANSETRON 8 MG/NS 50 ML IVPB
8.0000 mg | Freq: Three times a day (TID) | INTRAVENOUS | Status: DC | PRN
  Administered 2012-09-10: 8 mg via INTRAVENOUS
  Filled 2012-09-10: qty 8

## 2012-09-10 MED ORDER — PRO-STAT SUGAR FREE PO LIQD
30.0000 mL | Freq: Every day | ORAL | Status: DC
  Administered 2012-09-10 – 2012-09-12 (×4): 30 mL
  Filled 2012-09-10 (×4): qty 30

## 2012-09-10 MED ORDER — ALUM & MAG HYDROXIDE-SIMETH 200-200-20 MG/5ML PO SUSP
15.0000 mL | ORAL | Status: DC | PRN
  Administered 2012-09-10: 15 mL via ORAL
  Filled 2012-09-10: qty 30

## 2012-09-10 MED ORDER — JEVITY 1.2 CAL PO LIQD
237.0000 mL | Freq: Every day | ORAL | Status: DC
  Administered 2012-09-10 – 2012-09-11 (×6): 237 mL
  Administered 2012-09-11: 16:00:00
  Administered 2012-09-11 (×2): 237 mL
  Administered 2012-09-12: 15:00:00
  Administered 2012-09-12 (×4): 237 mL
  Administered 2012-09-13: 09:00:00
  Administered 2012-09-13 (×2): 237 mL

## 2012-09-10 NOTE — Telephone Encounter (Signed)
Left detailed message on [name]'s voicemail.

## 2012-09-10 NOTE — Telephone Encounter (Signed)
Please request any necessary forms from Ms. Yetta Barre.

## 2012-09-10 NOTE — Progress Notes (Signed)
Nutrition Brief Note  Intervention: - Ordered Jevity 1.2 6 cans day via PEG given 1 can every 2-3 hours when pt awake per pt preference (schedule put into TF order). Also ordered 30ml Prostat daily via PEG.  - Will continue to monitor   Pt tolerated 2 cans of Jevity 1.2 yesterday well without nausea/vomiting or diarrhea. Discussed with pt that goal was 6 cans/day. Adjusted TF schedule according to pt's wishes.   Levon Hedger MS, RD, LDN [phone] Pager [phone] After Hours Pager

## 2012-09-10 NOTE — Discharge Summary (Signed)
Physician Discharge Summary  Patient ID: [name] MRN: [id number] DOB/AGE: 1955/09/10 57 y.o.  Admit date: 09/07/2012 Discharge date: 09/13/2012  Primary Care Physician:  [name]., NP   Discharge Diagnoses:  Uncontrolled nausea, depression, uncontrolled pain, severe weakness secondary to prolonged ICU stay, severe malnutrition, dehydration  Present on Admission:  . A-fib . Cancer of breast . DIABETES MELLITUS, TYPE II . Diarrhea  Discharge Medications:    Medication List    ASK your doctor about these medications       amiodarone 200 MG tablet  Commonly known as:  PACERONE  Take 2 tablets (400 mg total) by mouth 2 (two) times daily. Take 2 tablets (400mg ) 2 times daily x 4 days, then 1 tablet (200mg ) daily     antiseptic oral rinse Liqd  15 mLs by Mouth Rinse route 2 (two) times daily as needed.     aspirin EC 81 MG tablet  Take 81 mg by mouth every morning.     atorvastatin 10 MG tablet  Commonly known as:  LIPITOR  Take 10 mg by mouth at bedtime.     buPROPion 150 MG 12 hr tablet  Commonly known as:  WELLBUTRIN SR  Take 150 mg by mouth every morning.     cholestyramine 4 G packet  Commonly known as:  QUESTRAN  Take 1 packet (4 g total) by mouth 2 (two) times daily.     escitalopram 10 MG tablet  Commonly known as:  LEXAPRO  Take 1 tablet (10 mg total) by mouth at bedtime.     feeding supplement Liqd  Take 1 Container by mouth 2 (two) times daily between meals.     feeding supplement (JEVITY 1.2 CAL) Liqd  Place 360 mLs into feeding tube 4 (four) times daily.     feeding supplement Liqd  Place 30 mLs into feeding tube 3 (three) times daily with meals.     free water Soln  Place 160 mLs into feeding tube 4 (four) times daily - after meals and at bedtime.     furosemide 80 MG tablet  Commonly known as:  LASIX  Take 1 tablet (80 mg total) by mouth 2 (two) times daily.     insulin aspart 100 UNIT/ML injection  Commonly  known as:  novoLOG  Inject 0-15 Units into the skin 3 (three) times daily with meals.     letrozole 2.5 MG tablet  Commonly known as:  FEMARA  Take 1 tablet (2.5 mg total) by mouth daily.     levothyroxine 125 MCG tablet  Commonly known as:  SYNTHROID, LEVOTHROID  Take 125 mcg by mouth daily before breakfast.     lidocaine-prilocaine cream  Commonly known as:  EMLA  Apply 1 application topically as needed (for port access).     megestrol 400 MG/10ML suspension  Commonly known as:  MEGACE  Take 10 mLs (400 mg total) by mouth 2 (two) times daily.     metoprolol tartrate 25 MG tablet  Commonly known as:  LOPRESSOR  Take 3 tablets (75 mg total) by mouth 2 (two) times daily.     multivitamin with minerals Tabs  Take 1 tablet by mouth daily.     omeprazole 20 MG capsule  Commonly known as:  PRILOSEC  Take 1 capsule (20 mg total) by mouth 2 (two) times daily.     ondansetron 4 MG disintegrating tablet  Commonly known as:  ZOFRAN ODT  Take 1 tablet (4 mg total) by mouth every  8 (eight) hours as needed for nausea.     oxyCODONE-acetaminophen 5-325 MG per tablet  Commonly known as:  PERCOCET/ROXICET  Take 1-2 tablets by mouth every 4 (four) hours as needed for pain.     potassium chloride 10 MEQ tablet  Commonly known as:  K-DUR  Take 1 tablet (10 mEq total) by mouth daily.     saccharomyces boulardii 250 MG capsule  Commonly known as:  FLORASTOR  Take 1 capsule (250 mg total) by mouth 2 (two) times daily.     sodium chloride 0.9 % injection  10-40 mLs by Intracatheter route as needed (flush).     warfarin 4 MG tablet  Commonly known as:  COUMADIN  Take 1 tablet (4 mg total) by mouth one time only at 6 PM.         Disposition and Follow-up: transfer to [facility]   Significant Diagnostic Studies:  Dg Chest 2 View  08/24/2012  *RADIOLOGY REPORT*  Clinical Data: Shortness of breath, history of breast cancer  CHEST - 2 VIEW  Comparison: 08/03/2012  Findings:  Cardiomediastinal silhouette is stable.  Left subclavian Port-A-Cath is unchanged in position.  There is small right pleural effusion with right basilar atelectasis or infiltrate.  Trace left basilar atelectasis or infiltrate.  Nodular density left base measures about 1 cm.  This may represent nipple shadow.  Repeat frontal view of the chest with nipple markers is recommended.  The patient is status post right mastectomy.  IMPRESSION: Status post right mastectomy. Small right pleural effusion with right basilar atelectasis or infiltrate. Trace left basilar atelectasis or infiltrate.  Nodular density left base may represent nipple shadow.  Repeat frontal view of the chest with nipple markers is recommended.   Original Report Authenticated By: Natasha Mead, M.D.    Dg Abd 1 View  08/27/2012  *RADIOLOGY REPORT*  Clinical Data: Check progression of barium contrast.  ABDOMEN - 1 VIEW  Comparison: Abdominal radiograph performed 08/03/2012  Findings: Ingested barium contrast has progressed into the colon, with contrast seen filling the ascending, descending and sigmoid colon, to the level of the rectum.  Visualized small bowel loops are grossly unremarkable in appearance.  No free intra-abdominal air is identified, though evaluation for free air is suboptimal on a supine views.  Clips are noted within the right upper quadrant, reflecting prior cholecystectomy.  No acute osseous abnormalities are seen.  IMPRESSION: Progression of ingested barium contrast into the colon, with contrast filling the ascending, descending and sigmoid colon, to the level of the rectum.  Unremarkable bowel gas pattern; no free intra-abdominal air seen.   Original Report Authenticated By: Tonia Ghent, M.D.    Ct Head Wo Contrast  09/08/2012  *RADIOLOGY REPORT*  Clinical Data: Lower extremity weakness.  Breast cancer  CT HEAD WITHOUT CONTRAST  Technique:  Contiguous axial images were obtained from the base of the skull through the vertex  without contrast.  Comparison: None  Findings: Mild atrophy.  Ventricle size is normal.  Negative for acute or chronic infarct.  Negative for hemorrhage or mass lesion. No skull lesion is identified.  IMPRESSION: No acute abnormality.  Negative for stroke or edema.   Original Report Authenticated By: [name], M.D.    Ir Gastrostomy Tube Mod Sed  08/27/2012  *RADIOLOGY REPORT*  Clinical Data:  Breast carcinoma, malnutrition and failure to thrive.  PERCUTANEOUS GASTROSTOMY TUBE PLACEMENT  Sedation:  0.5 mg IV Versed  Total Moderate Sedation Time: 10 minutes.  Contrast:  20 ml Omnipaque-300  Fluoroscopy Time: 2 minutes and 42 seconds.  Procedure:  The procedure, risks, benefits, and alternatives were explained to the patient.  Questions regarding the procedure were encouraged and answered.  The patient understands and consents to the procedure.  The evening prior to the procedure, the patient was given thin liquid barium to ingest in order to opacify the colon.  A 5-French catheter was then advanced through the patient's mouth under fluoroscopy into the esophagus and to the level of the stomach. This catheter was used to insufflate the stomach with air under fluoroscopy.  The abdominal wall was prepped with Betadine in a sterile fashion, and a sterile drape was applied covering the operative field.  A sterile gown and sterile gloves were used for the procedure. Local anesthesia was provided with 1% Lidocaine.  A skin incision was made in the upper abdominal wall.  Under fluoroscopy, an 18 gauge trocar needle was advanced into the stomach.  Contrast injection was performed to confirm intraluminal position of the needle tip.  A single T tack was then deployed in the lumen of the stomach.  This was brought up to tension at the skin surface.  Over a guidewire, a 9-French sheath was advanced into the lumen of the stomach.  The wire was left in place as a safety wire.  A loop snare device from a percutaneous  gastrostomy kit was then advanced into the stomach.  A floppy guide wire was advanced through the orogastric catheter under fluoroscopy in the stomach.  The loop snare advanced through the percutaneous gastric access was used to snare the guide wire. This allowed withdrawal of the loop snare out of the patient's mouth by retraction of the orogastric catheter and wire.  A 20-French bumper retention gastrostomy tube was looped around the snare device.  It was then pulled back through the patient's mouth. The retention bumper was brought up to the anterior gastric wall. The T tack suture was cut at the skin.  The exiting gastrostomy tube was cut to appropriate length and a feeding adapter applied. The catheter was injected with contrast material to confirm position and a fluoroscopic spot image saved.  The tube was then flushed with saline.  A dressing was applied over the gastrostomy exit site.  Complications:  None  Findings:  Initial fluoroscopy demonstrates adequate opacification of the colon by ingested barium in order to prevent colonic injury during the procedure.  The stomach distended well with air allowing safe placement of the gastrostomy tube.  After placement, the tip of the gastrostomy tube lies in the body of the stomach.  IMPRESSION: Percutaneous gastrostomy with placement of a 20-French bumper retention tube in the body of the stomach.  This tube can be used for percutaneous feeds beginning in 24 hours after placement.   Original Report Authenticated By: Irish Lack, M.D.    Dg Chest Port 1 View  08/26/2012  *RADIOLOGY REPORT*  Clinical Data: Follow up pulmonary edema  PORTABLE CHEST - 1 VIEW  Comparison: Prior chest x-ray 08/25/2012  Findings: Stable position of left subclavian approach portacatheter.  The catheter tip is at the superior cavoatrial junction.  No significant interval change in the appearance of the chest with persistent pulmonary vascular congestion, mild edema and bibasilar  pleural effusions with associated atelectasis.  Surgical changes of prior mastectomy and axillary node dissection are similar.  Stable cardiomegaly.  No pneumothorax.  No acute osseous abnormality.  IMPRESSION: No significant interval change. Stable CHF versus volume overload.   Original  Report Authenticated By: Malachy Moan, M.D.    Portable Chest 1 View  08/25/2012  *RADIOLOGY REPORT*  Clinical Data: Atrial fibrillation, pleural effusion.  PORTABLE CHEST - 1 VIEW  Comparison: 08/24/2012 and PET 07/05/2012.  Findings: Trachea is midline.  Heart size stable.  Left subclavian Port-A-Cath tip projects over the SVC.  Mild diffuse bilateral air space disease, bibasilar predominant, with slight worsening from 08/24/2012.  Bilateral pleural effusions. Surgical clips in the right axillary region with changes of right mastectomy.  IMPRESSION: Favor worsening congestive heart failure.   Original Report Authenticated By: Leanna Battles, M.D.     Discharge Laboratory Values: Basic Metabolic Panel:  Recent Labs Lab 09/04/12 0503 09/05/12 0606 09/06/12 0500 09/07/12 1020 09/08/12 0500  NA 133* 134* 133* 130* 136  K 3.5 3.8 4.5 4.9 4.6  CL 94* 98 98 94* 101  CO2 33* 33* 32 28 29  GLUCOSE 95 96 103* 108* 89  BUN 16 15 16  27* 27*  CREATININE 0.57 0.56 0.59 0.76 0.75  CALCIUM 7.6* 7.6* 7.6* 8.0* 7.8*   GFR Estimated Creatinine Clearance: 87.1 ml/min (by C-G formula based on Cr of 0.75). Liver Function Tests:  Recent Labs Lab 09/07/12 1020 09/08/12 0500  AST 40* 38*  ALT 27 27  ALKPHOS 213* 180*  BILITOT 0.3 0.4  PROT 5.7* 5.5*  ALBUMIN 1.6* 1.7*   No results found for this basename: LIPASE, AMYLASE,  in the last 168 hours No results found for this basename: AMMONIA,  in the last 168 hours Coagulation profile  Recent Labs Lab 09/06/12 0500 09/07/12 1640 09/08/12 0500 09/09/12 0615 09/10/12 0550  INR 2.19* 3.86* 4.02* 4.27* 3.38*    CBC:  Recent Labs Lab 09/04/12 0503  09/05/12 0606 09/06/12 0500 09/07/12 1020 09/08/12 0500  WBC 8.9 6.4 9.8 11.9* 10.1  NEUTROABS  --   --   --  9.0*  --   HGB 10.4* 9.6* 9.7* 9.6* 8.9*  HCT 31.9* 29.7* 30.3* 30.1* 28.7*  MCV 85.5 85.3 86.1 85.8 87.0  PLT 217 198 233 249 210   Cardiac Enzymes: No results found for this basename: CKTOTAL, CKMB, CKMBINDEX, TROPONINI,  in the last 168 hours BNP: No components found with this basename: POCBNP,  CBG:  Recent Labs Lab 09/07/12 1617 09/07/12 2213 09/08/12 1713 09/09/12 1349 09/09/12 2152  GLUCAP 89 91 77 78 99   D-Dimer No results found for this basename: DDIMER,  in the last 72 hours Hgb A1c No results found for this basename: HGBA1C,  in the last 72 hours Lipid Profile No results found for this basename: CHOL, HDL, LDLCALC, TRIG, CHOLHDL, LDLDIRECT,  in the last 72 hours Thyroid function studies No results found for this basename: TSH, T4TOTAL, FREET3, T3FREE, THYROIDAB,  in the last 72 hours Anemia work up No results found for this basename: VITAMINB12, FOLATE, FERRITIN, TIBC, IRON, RETICCTPCT,  in the last 72 hours Microbiology No results found for this or any previous visit (from the past 240 hour(s)).   Brief H and P: For complete details please refer to admission H and P, but in brief, patient was readmitted only 24 h after discharge with uncontrolled diarrhea, dehydration, severe depressive episode, poorly controlled pain. See Hospital course for further details  Physical Exam at Discharge:  BP 120/64  Pulse 65  Temp(Src) 98.2 F (36.8 C) (Oral)  Resp 16  Ht 5\' 4"  (1.626 m)  Wt 216 lb (97.977 kg)  BMI 37.06 kg/m2  SpO2 98% Gen: middle aged white woman examined  in bed Cardiovascular: RRR, no murmur appreciated Respiratory: no rales or rhonchi Gastrointestinal:soft, NT, +BS, PEG in place Extremities: anasarca secondary to low albumin    Hospital Course:  Active Problems:   Cancer of breast   DIABETES MELLITUS, TYPE II   A-fib    Diarrhea   Malnutrition  57 y.o. [name] recently admitted with A Fib/ RVR, readmitted with severe diarrhea, poorly controlled pain and nausea  (1) status post right mastectomy under the care of Dr. 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 Lymphovascular perineural invasion was identified. 2 of 18 lymph nodes were involved. Margins were clear. Pathologic staging pT3, pN1a, stage IIIA.  (2) treated in the adjuvant setting with docetaxel/ cyclophosphamide given every 3 weeks, with Neulasta on day 2 for granulocyte support. First treatment was given on 07/06/2012. Due to intolerance, we decreased her doses by 15% beginning cycle 2, given 07/29/2012. However, tolerance remained poor and no further chemotherapy is planned  (3) patient will need postmastectomy radiation--Dr Michell Heinrich to evaluate once patient Rehabb'd (4) letrozole started April 2014, plan is to continue for 5 years;  (5) comorbidities  (a) diabetes-- currently on SSI only as caloric intake poor; continue to monitor  (b) hypertension--controlled  (c) A fib--anticoagulated; currently RRR  (d) chronic liver disease--aware  (e) c diff negative diarrhea--resolved; questran and imodium now PRN  (f) malnutrition: s/p PEG placement; tolerating 6 feeds/day w/o diarrhea; can be transtioned so all nutrition/ hydration is by PEG (so IVF could be stopped)  (g) end of life issues: DNR in place, but patient has no terminal disease, her problems being either curable or chronic  (h) pain: largely resolved; have changed morphine to PRN   Diet:  Regular diet but very poor po intake; continue PEG feeds as written Activity:  As tolerated  Condition at Discharge: improved    Signed: Dr. Ruthann Cancer [phone]  09/10/2012, 8:38 AM

## 2012-09-10 NOTE — Progress Notes (Signed)
CARE MANAGEMENT NOTE 09/10/2012  Patient:  [name], [name]   Account Number:  [id number]  Date Initiated:  09/10/2012  Documentation initiated by:  Ezekiel Ina  Subjective/Objective Assessment:   pt admitted with cco leg swelling, hx breast CA, CHF     Action/Plan:   plan to discharge   Anticipated DC Date:  09/12/2012   Anticipated DC Plan:  HOME/SELF CARE  In-house referral  Clinical Social Worker      DC Planning Services  CM consult      Choice offered to / List presented to:             Status of service:  In process, will continue to follow Medicare Important Message given?  NA - LOS <3 / Initial given by admissions (If response is "NO", the following Medicare IM given date fields will be blank) Date Medicare IM given:   Date Additional Medicare IM given:    Discharge Disposition:    Per UR Regulation:  Reviewed for med. necessity/level of care/duration of stay  If discussed at Long Length of Stay Meetings, dates discussed:   09/09/2012    Comments:  09/09/12 MMcGibboney, RN, BSN Pt plan to dc to CIR, however there are no beds available at present time. I spoke with pt, her husband and daughter at bedside, concerning discharge and alternative discharge plan. Pt and husband asked for [facility] in [place], [place].  CSW will follow for SNF.   09/07/12 MMcGibboney, RN, BSN Pt transferred to 3W on 4/28. PT/OT ordered for pt.  Pt would like to go to CIR. Will continue to follow for discharge.

## 2012-09-10 NOTE — Progress Notes (Signed)
I discussed P.T. assessment with Dr. Wynn Banker this morning. I await further OT eval and therapy progress to assess tolerance and pt's continued plans for resuming treatment at this time. I will contact Dr. [name] to discuss overall plan of care. No plans to admit this pt to intense inpt rehab this weekend. I will meet with pt and family today. I discussed with RN CM. [phone]

## 2012-09-10 NOTE — Clinical Social Work Note (Signed)
CSW currently working with patient and family throughout hospitalization and assisting in DC plans.  Mrs. Halsted and family remain motivated for CIR services, but have agreed to fax out to [facility] SNF's as "back up plan".  CSW discussed with Dr. [name], MD awaiting CIR assessment/approval.  CSW will continue to follow up with patient and family.  Kathrin Penner, MSW, LCSW Clinical Social Worker [name] [phone]

## 2012-09-10 NOTE — Clinical Social Work Placement (Signed)
Clinical Social Work Department CLINICAL SOCIAL WORK PLACEMENT NOTE 09/10/2012  Patient:  [name], [name]  Account Number:  [id number] Admit date:  09/07/2012  Clinical Social Worker:  Etta Grandchild  Date/time:  09/10/2012 12:00 N  Clinical Social Work is seeking post-discharge placement for this patient at the following level of care:   SKILLED NURSING   (*CSW will update this form in Epic as items are completed)     Patient/family provided with [facility] Department of Clinical Social Work's list of facilities offering this level of care within the geographic area requested by the patient (or if unable, by the patient's family).  09/10/2012  Patient/family informed of their freedom to choose among providers that offer the needed level of care, that participate in Medicare, Medicaid or managed care program needed by the patient, have an available bed and are willing to accept the patient.    Patient/family informed of [facility]' ownership interest in [facility] as well as of the fact that they are under no obligation to receive care at this facility.  PASARR submitted to EDS on  PASARR number received from EDS on   FL2 transmitted to all facilities in geographic area requested by pt/family on  09/10/2012 FL2 transmitted to all facilities within larger geographic area on   Patient informed that his/her managed care company has contracts with or will negotiate with  certain facilities, including the following:     Patient/family informed of bed offers received:   Patient chooses bed at  Physician recommends and patient chooses bed at    Patient to be transferred to  on   Patient to be transferred to facility by   The following physician request were entered in Epic:   Additional Comments:  Kathrin Penner, MSW, LCSW Clinical Social Worker [facility] [phone]

## 2012-09-10 NOTE — Evaluation (Signed)
Occupational Therapy Evaluation Patient Details Name: [name] MRN: [id number] DOB: 1956-04-03 Today's Date: 09/10/2012 Time: 4098-1191 OT Time Calculation (min): 32 min  OT Assessment / Plan / Recommendation Clinical Impression  Pt is a 57 yo female admitted w history of Breast CA with full treatment, afib and peg tube placed in April.  Pt was getting some assist with adls prior to this admission and is now deconditioned and needing some intense rehab before returning home.  Pt is very motivated and should do well tolerating rehab.  Deficits are listed below.  Pt has 24 hour S available after d/c.    OT Assessment  Patient needs continued OT Services    Follow Up Recommendations  CIR    Barriers to Discharge None    Equipment Recommendations  None recommended by OT    Recommendations for Other Services Rehab consult  Frequency  Min 3X/week    Precautions / Restrictions Precautions Precautions: Fall Precaution Comments: Monitor HR Restrictions Weight Bearing Restrictions: No   Pertinent Vitals/Pain Pt with no c/o pain.    ADL  Eating/Feeding: Set up Where Assessed - Eating/Feeding: Chair Grooming: Wash/dry face;Wash/dry hands;Teeth care;Simulated;Set up Where Assessed - Grooming: Unsupported sitting Upper Body Bathing: Simulated;Minimal assistance Where Assessed - Upper Body Bathing: Supported sitting Lower Body Bathing: Simulated;Maximal assistance Where Assessed - Lower Body Bathing: Supported sit to stand Upper Body Dressing: Performed;Moderate assistance Where Assessed - Upper Body Dressing: Supported sitting Lower Body Dressing: Performed;Maximal assistance Where Assessed - Lower Body Dressing: Supported sit to Pharmacist, hospital: Performed;Moderate assistance Toilet Transfer Method: Sit to stand Toilet Transfer Equipment: Bedside commode Toileting - Clothing Manipulation and Hygiene: Maximal assistance;Performed Where Assessed - Medical sales representative and Hygiene: Standing Equipment Used: Gait belt;Rolling walker Transfers/Ambulation Related to ADLs: Husband very supportive. Pt had had a BM and was not aware.  Clean up was significant.  Explained to husband wearing a brief may help at home if this is something that happens often ADL Comments: Pt requires more assist with LE adls than upper.  May need to be introducted to AE if she wants to be fully independent.  Pt very motivated with great participation in all adls.    OT Diagnosis: Generalized weakness  OT Problem List: Decreased strength;Decreased activity tolerance;Impaired balance (sitting and/or standing);Decreased knowledge of use of DME or AE OT Treatment Interventions: Self-care/ADL training;Energy conservation;Therapeutic activities   OT Goals Acute Rehab OT Goals OT Goal Formulation: With patient/family Time For Goal Achievement: 09/24/12 Potential to Achieve Goals: Good ADL Goals Pt Will Perform Grooming: with supervision;Standing at sink ADL Goal: Grooming - Progress: Goal set today Pt Will Perform Upper Body Bathing: with supervision;Sitting at sink ADL Goal: Upper Body Bathing - Progress: Goal set today Pt Will Perform Lower Body Bathing: with mod assist;Sit to stand from bed;Sit to stand from chair ADL Goal: Lower Body Bathing - Progress: Goal set today Pt Will Perform Lower Body Dressing: with mod assist;Sit to stand from chair;Sit to stand from bed;with adaptive equipment ADL Goal: Lower Body Dressing - Progress: Goal set today Pt Will Transfer to Toilet: with min assist;Ambulation;3-in-1;Comfort height toilet ADL Goal: Toilet Transfer - Progress: Goal set today Pt Will Perform Toileting - Clothing Manipulation: with min assist;Standing ADL Goal: Toileting - Clothing Manipulation - Progress: Goal set today Pt Will Perform Toileting - Hygiene: with min assist;Sit to stand from 3-in-1/toilet ADL Goal: Toileting - Hygiene - Progress: Goal set  today Additional ADL Goal #1: Pt will particiapte in 25 mins  therapeutic activity with no more than 2 rest breaks to increase activity tolerance ADL Goal: Additional Goal #1 - Progress: Goal set today Miscellaneous OT Goals Miscellaneous OT Goal #1: Pt will complete UB adls from unsupported sitting with supervision and initiate at least one rest break for energy conservation OT Goal: Miscellaneous Goal #1 - Progress: Goal set today Miscellaneous OT Goal #2: Pt will be independent with light resistance theraband program to improve strength/endurance for adl OT Goal: Miscellaneous Goal #2 - Progress: Goal set today  Visit Information  Last OT Received On: 09/10/12 Assistance Needed: +1    Subjective Data  Subjective: pt agree to OOB/p Patient Stated Goal: I want to walk and get stronger and go home.   Prior Functioning     Home Living Lives With: Spouse;Family Available Help at Discharge: Family;Available 24 hours/day Type of Home: House Home Access: Stairs to enter [employer] of Steps: plans to get ramp Entrance Stairs-Rails: None Home Layout: One level Bathroom Shower/Tub: Tub/shower unit;Curtain Firefighter: Standard Home Adaptive Equipment: Wheelchair - manual;Bedside commode/3-in-1;Walker - four wheeled;Shower chair with back Prior Function Level of Independence: Needs assistance Needs Assistance: Bathing;Dressing;Toileting;Meal Prep;Light Housekeeping Bath: Minimal Dressing: Maximal Toileting: Moderate Meal Prep: Total Light Housekeeping: Total Able to Take Stairs?: No Driving: No Comments: Pt has been hospitalized many times recently.  Was taken to SNF and had poor experience but is motivated to work and become more I.  Feel she might benefit from CIR and with correct scheduling of therapy, could tolerate it. Communication Communication: No difficulties Dominant Hand: Right         Vision/Perception Vision - History Patient Visual Report: No  change from baseline Vision - Assessment Vision Assessment: Vision not tested   Cognition  Cognition Arousal/Alertness: Awake/alert Behavior During Therapy: WFL for tasks assessed/performed Overall Cognitive Status: Impaired/Different from baseline Area of Impairment: Memory Memory: Decreased short-term memory General Comments: Pt seemed to have some minor memory deficits during evaluation in recalling her age and her grandchildren's ages.  This is not normal for her.    Extremity/Trunk Assessment Right Upper Extremity Assessment RUE ROM/Strength/Tone: Deficits RUE ROM/Strength/Tone Deficits: strength grossly 4/5 RUE Sensation: WFL - Light Touch RUE Coordination: WFL - gross/fine motor Left Upper Extremity Assessment LUE ROM/Strength/Tone: Deficits LUE ROM/Strength/Tone Deficits: strength grossly 4/5 LUE Sensation: WFL - Light Touch LUE Coordination: WFL - gross/fine motor Trunk Assessment Trunk Assessment: Normal     Mobility Bed Mobility Bed Mobility: Supine to Sit Supine to Sit: 3: Mod assist Sitting - Scoot to Edge of Bed: 3: Mod assist Details for Bed Mobility Assistance: Pt much better today with bed  mobility per PT note yesterday.  Pt was able to do most bed mobility with min to mod assist. Transfers Transfers: Sit to Stand;Stand to Sit Sit to Stand: 2: Max assist;With armrests;From bed Sit to Stand: Patient Percentage: 40% Stand to Sit: 3: Mod assist;With armrests;To chair/3-in-1 Details for Transfer Assistance: Pt requires more assist coming sit to stand than for walking.  Pt required max assist on first attempt but by last time standing pt was mod assist to stand.  Pt was able to walk into hallway with walker over 75 feet with only +1 assist today.     Exercise     Balance Balance Balance Assessed: Yes Static Standing Balance Static Standing - Balance Support: Bilateral upper extremity supported Static Standing - Level of Assistance: 5: Stand by  assistance Static Standing - Comment/# of Minutes: 4   End of Session  OT - End of Session Activity Tolerance: Patient tolerated treatment well Patient left: in chair;with call bell/phone within reach;with family/visitor present Nurse Communication: Mobility status  GO Functional Assessment Tool Used: clinical judgement Functional Limitation: Self care Self Care Current Status (Z6109): At least 60 percent but less than 80 percent impaired, limited or restricted Self Care Goal Status (U0454): At least 20 percent but less than 40 percent impaired, limited or restricted   Hope Budds 09/10/2012, 10:59 AM [phone]

## 2012-09-10 NOTE — Telephone Encounter (Signed)
Please advise/SLS 

## 2012-09-10 NOTE — Progress Notes (Addendum)
ANTICOAGULATION CONSULT NOTE - Follow Up Consult  Pharmacy Consult for Coumadin Indication: Atrial Fibrillation  Allergies  Allergen Reactions  . Olmesartan Medoxomil Cough    REACTION: ? if cough  . Tetracycline Hcl     Unknown reaction, too long for patient to remember   . Venlafaxine     REACTION: severe dry moouth  . Adhesive (Tape) Rash     Recent Labs  09/07/12 1020  09/08/12 0500 09/09/12 0615 09/10/12 0550  HGB 9.6*  --  8.9*  --   --   HCT 30.1*  --  28.7*  --   --   PLT 249  --  210  --   --   LABPROT  --   < > 36.7* 38.4* 32.3*  INR  --   < > 4.02* 4.27* 3.38*  CREATININE 0.76  --  0.75  --   --   < > = values in this interval not displayed.  Estimated Creatinine Clearance: 87.1 ml/min (by C-G formula based on Cr of 0.75).   Assessment: 6 yoF on warfarin for A.Fib, Recently started on warfarin and continuing on this admission. Patient also has a history of DVT in 2006.  Of note patient has a hx of chronic liver disease, and malnutrition with recent PEG placement.     Last dose of warfarin was 4/27 and has been held since secondary to supratherapeutic INR.  INR today remains elevated (3.38) but is trending down.   INR likely elevated secondary to Poor PO intake, PO intake has improved in the last 24 hours  Last CBC on 4/30 - Hgb was trending down, will repeat for tomorrow.   DI Amiodarone - monitor   Goal of Therapy:  INR 2-3 Monitor platelets by anticoagulation protocol: Yes   Plan:  Hold coumadin tonight.   F/u daily PT/INR.   CBC in AM  Haisley Arens, Loma Messing PharmD Pager #: [phone] 10:13 AM 09/10/2012

## 2012-09-10 NOTE — Progress Notes (Addendum)
Palliative Medicine Team SW Psychosocial follow up. Pt working with PT. Discussed briefly with [facility]. Ongoing support from several support staff. Will shadow along, available as needed.   2:26 PM Later met with family in hallways who report their continued coping efforts. Empathized with family's frustration over pt's transfer from closing PCU. Family spoke highly of pt's progress with PT this morning, are hopeful for continue rehabilitation. Available as needed.   Kennieth Francois, LCSWA PMT Phone [phone] Pager [phone]

## 2012-09-10 NOTE — Progress Notes (Signed)
Patient [name]      DOB: 1955/08/21      JYN:[id number]   Offered emotional support.  Patient hopeful for inpatient rehab stay.  Tearful states "I am afraid they are going to put me out on the street".  CIr being recommended.   Will continue to support family through the weekend.   Aritha Huckeba L. Ladona Ridgel, MD MBA The Palliative Medicine Team at [facility] Phone: [phone] Pager: [phone]

## 2012-09-10 NOTE — Progress Notes (Signed)
Clinical Social Work  CSW went to meet with patient. Several family members and friends were visiting. Patient was smiling and in good spirits and asked CSW to follow up at later time.  [place], [place] [phone]

## 2012-09-10 NOTE — Progress Notes (Signed)
Physical Therapy Treatment Patient Details Name: [name] MRN: [id number] DOB: 1955/05/24 Today's Date: 09/10/2012 Time: 4098-1191 PT Time Calculation (min): 20 min  PT Assessment / Plan / Recommendation Comments on Treatment Session  Pt ambulated 60' + 40' with RW, with seated rest break between gait trials. Distance increased since yesterday, distance limited by fatigue and 2/4 dyspnea. Pt requires max assist for sit to stand. Pt/husband very much hoping pt can go to CIR.     Follow Up Recommendations  CIR     Does the patient have the potential to tolerate intense rehabilitation     Barriers to Discharge        Equipment Recommendations  None recommended by PT    Recommendations for Other Services OT consult  Frequency Min 3X/week   Plan Discharge plan remains appropriate;Frequency remains appropriate    Precautions / Restrictions Precautions Precautions: Fall Precaution Comments: Monitor HR Restrictions Weight Bearing Restrictions: No   Pertinent Vitals/Pain **Pt denies pain.  HR 93 walking, SaO2 97% on RA walking*    Mobility  Bed Mobility Bed Mobility: Not assessed Supine to Sit: 3: Mod assist Sitting - Scoot to Edge of Bed: 3: Mod assist Details for Bed Mobility Assistance: Pt much better today with bed  mobility per PT note yesterday.  Pt was able to do most bed mobility with min to mod assist. Transfers Sit to Stand: 2: Max assist;With armrests;From chair/3-in-1;From elevated surface;With upper extremity assist Sit to Stand: Patient Percentage: 50% Stand to Sit: 3: Mod assist;With armrests;To chair/3-in-1 Stand to Sit: Patient Percentage: 70% Details for Transfer Assistance: Verbal cues for hand placement with sit to/from stand. Assist to scoot to edge of chair, and to rise.  Ambulation/Gait Ambulation/Gait Assistance: 4: Min guard Ambulation Distance (Feet): 100 Feet (60' + 58' with 2 minute seated rest break) Assistive device: Rolling  walker Ambulation/Gait Assistance Details: min/guard assist for safety/balance. HR 94 with walking, SaO2 97% on RA. 2/4 dyspnea with walking. Gait Pattern: Step-through pattern;Decreased stride length;Narrow base of support Gait velocity: decreased General Gait Details: increased gait distance today, overall distance limited by fatigue    Exercises     PT Diagnosis:    PT Problem List:   PT Treatment Interventions:     PT Goals Acute Rehab PT Goals PT Goal Formulation: With patient/family Time For Goal Achievement: 09/09/12 Potential to Achieve Goals: Fair Pt will go Supine/Side to Sit: with supervision Pt will go Sit to Stand: with supervision PT Goal: Sit to Stand - Progress: Progressing toward goal Pt will go Stand to Sit: with supervision PT Goal: Stand to Sit - Progress: Progressing toward goal Pt will Transfer Bed to Chair/Chair to Bed: with min assist Pt will Ambulate: 51 - 150 feet;with min assist;with least restrictive assistive device PT Goal: Ambulate - Progress: Progressing toward goal  Visit Information  Last PT Received On: 09/10/12 Assistance Needed: +1    Subjective Data  Subjective: I really want to go to rehab at [facility] ([facility]), I will work hard, please help me get there.  Patient Stated Goal: get better to walk   Cognition  Cognition Arousal/Alertness: Awake/alert Behavior During Therapy: WFL for tasks assessed/performed Overall Cognitive Status: Within Functional Limits for tasks assessed Area of Impairment: Memory Memory: Decreased short-term memory General Comments: Pt seemed to have some minor memory deficits during evaluation in recalling her age and her grandchildren's ages.  This is not normal for her.    Balance  Balance Balance Assessed: Yes Static Standing Balance  Static Standing - Balance Support: Bilateral upper extremity supported Static Standing - Level of Assistance: 5: Stand by assistance Static Standing - Comment/# of Minutes: 4  End  of Session PT - End of Session Equipment Utilized During Treatment: Gait belt Activity Tolerance: Patient limited by fatigue;Patient tolerated treatment well Patient left: in chair;with call bell/phone within reach;with family/visitor present Nurse Communication: Mobility status   GP     [name] 09/10/2012, 12:00 PM [phone]

## 2012-09-10 NOTE — Progress Notes (Signed)
I spoke earlier today with Dr. [name] by phone to discuss pt's overall medical plan of care and prognosis. This afternoon I spoke with pt, her husband, and her daughter at bedside. Patient has done well with therapy today and then sat in chair for 2 to 3 hrs and tolerated well. I feel patient can tolerate intense inpt rehab at this time and I will follow up on Monday to clarify if bed will be available to admit her on Monday. I explained that if bed not available, pt would need SNF rehab. I will begin insurance approval also this afternoon. Please call me with any questions. [phone]

## 2012-09-10 NOTE — Progress Notes (Signed)
[name]   DOB:1955/11/18   ZO#:[id number]   JWJ#:[id number]  Subjective: [name] is very positive today--hopeful to be accepted into inpt Rehab and very motivated--excited that she was able to walk some yesterday w PT's help; taking some po's; no diarrhea (is passing gas); pain well-controlled; Family in room  Objective: middle aged white [name] examined in bed.  Filed Vitals:   09/10/12 0618  BP: 120/64  Pulse: 65  Temp: 98.2 F (36.8 C)  Resp: 16    Body mass index is 37.06 kg/(m^2).  Intake/Output Summary (Last 24 hours) at 09/10/12 0848 Last data filed at 09/10/12 0620  Gross per 24 hour  Intake    545 ml  Output   2125 ml  Net  -1580 ml     Oropharynx dry, lips improving  No cervical or supraclavicular adenomapthy  Lungs clear -- auscultated anteriorly  Heart regular, no murmur heard  Abdomen soft, +BS, PEG in place  MSK LE edema as before  Neuro nonfocal, well oriented, positive affect  Breast exam: deferred  CBG (last 3)   Recent Labs  09/08/12 1713 09/09/12 1349 09/09/12 2152  GLUCAP 77 78 99     Labs:  Lab Results  Component Value Date   WBC 10.1 09/08/2012   HGB 8.9* 09/08/2012   HCT 28.7* 09/08/2012   MCV 87.0 09/08/2012   PLT 210 09/08/2012   NEUTROABS 9.0* 09/07/2012    @LASTCHEMISTRY @  Urine Studies No results found for this basename: UACOL, UAPR, USPG, UPH, UTP, UGL, UKET, UBIL, UHGB, UNIT, UROB, ULEU, UEPI, UWBC, URBC, UBAC, CAST, CRYS, UCOM, BILUA,  in the last 72 hours  Basic Metabolic Panel:  Recent Labs Lab 09/04/12 0503 09/05/12 0606 09/06/12 0500 09/07/12 1020 09/08/12 0500  NA 133* 134* 133* 130* 136  K 3.5 3.8 4.5 4.9 4.6  CL 94* 98 98 94* 101  CO2 33* 33* 32 28 29  GLUCOSE 95 96 103* 108* 89  BUN 16 15 16  27* 27*  CREATININE 0.57 0.56 0.59 0.76 0.75  CALCIUM 7.6* 7.6* 7.6* 8.0* 7.8*   GFR Estimated Creatinine Clearance: 87.1 ml/min (by C-G formula based on Cr of 0.75). Liver Function Tests:  Recent Labs Lab  09/07/12 1020 09/08/12 0500  AST 40* 38*  ALT 27 27  ALKPHOS 213* 180*  BILITOT 0.3 0.4  PROT 5.7* 5.5*  ALBUMIN 1.6* 1.7*   No results found for this basename: LIPASE, AMYLASE,  in the last 168 hours No results found for this basename: AMMONIA,  in the last 168 hours Coagulation profile  Recent Labs Lab 09/06/12 0500 09/07/12 1640 09/08/12 0500 09/09/12 0615 09/10/12 0550  INR 2.19* 3.86* 4.02* 4.27* 3.38*    CBC:  Recent Labs Lab 09/04/12 0503 09/05/12 0606 09/06/12 0500 09/07/12 1020 09/08/12 0500  WBC 8.9 6.4 9.8 11.9* 10.1  NEUTROABS  --   --   --  9.0*  --   HGB 10.4* 9.6* 9.7* 9.6* 8.9*  HCT 31.9* 29.7* 30.3* 30.1* 28.7*  MCV 85.5 85.3 86.1 85.8 87.0  PLT 217 198 233 249 210   Cardiac Enzymes: No results found for this basename: CKTOTAL, CKMB, CKMBINDEX, TROPONINI,  in the last 168 hours BNP: No components found with this basename: POCBNP,  CBG:  Recent Labs Lab 09/07/12 1617 09/07/12 2213 09/08/12 1713 09/09/12 1349 09/09/12 2152  GLUCAP 89 91 77 78 99   D-Dimer No results found for this basename: DDIMER,  in the last 72 hours Hgb A1c No results found for  this basename: HGBA1C,  in the last 72 hours Lipid Profile No results found for this basename: CHOL, HDL, LDLCALC, TRIG, CHOLHDL, LDLDIRECT,  in the last 72 hours Thyroid function studies No results found for this basename: TSH, T4TOTAL, FREET3, T3FREE, THYROIDAB,  in the last 72 hours Anemia work up No results found for this basename: VITAMINB12, FOLATE, FERRITIN, TIBC, IRON, RETICCTPCT,  in the last 72 hours Microbiology No results found for this or any previous visit (from the past 240 hour(s)).    Studies:  Ct Head Wo Contrast  09/08/2012  *RADIOLOGY REPORT*  Clinical Data: Lower extremity weakness.  Breast cancer  CT HEAD WITHOUT CONTRAST  Technique:  Contiguous axial images were obtained from the base of the skull through the vertex without contrast.  Comparison: None  Findings:  Mild atrophy.  Ventricle size is normal.  Negative for acute or chronic infarct.  Negative for hemorrhage or mass lesion. No skull lesion is identified.  IMPRESSION: No acute abnormality.  Negative for stroke or edema.   Original Report Authenticated By: [name], M.D.       Assessment: 56 y.o. [name] admitted with A Fib/ RVR, readmitted with severe diarrhea, poorly controlled pain and nausea  (1) status post right mastectomy under the care of Dr. 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 Lymphovascular perineural invasion was identified. 2 of 18 lymph nodes were involved. Margins were clear. Pathologic staging pT3, pN1a, stage IIIA.   (2) treated in the adjuvant setting with docetaxel/cyclophosphamide given every 3 weeks, with Neulasta on day 2 for granulocyte support. First treatment was given on 07/06/2012. Due to intolerance, we decreased her doses by 15% beginning cycle 2, given 07/29/2012. However, tolerance remained poor and no further chemotherapy is planned   (3) patient will need postmastectomy radiation--.Dr Michell Heinrich to reassess once outpatient  (4) letrozole started April 2014, plan is to continue for 5 years;  (4) comorbidities  (a) diabetes-- currently on SSI only as caloric intake poor; continue to monitor  (b) hypertension--controlled  (c) A fib--anticoagulated; currently RRR  (d) chronic liver disease--aware  (e) c diff negative diarrhea--resolved; am changing questran and imodium to PRN  (f) malnutrition: s/p PEG placement; taking a little po; tolerating Jevity BID; increase Jevity 5/5 to TID if it does not cause diarrhea  (g) end of life issues: DNR in place, but patient has no terminal disease, her problems being either curable or chronic  (h) pain: will continue morphine for now--she is tolerating it well and it  is contributing to her improved functional status    Plan: With more support, resolution of the diarrhea, and less/ no pain, [name] has turned the corner and is now very motivated to get Rehabbed, walk and eat so she can eventually get back home.I have simplified meds as much as possible and I have done a d/c summary, which is left "pending". If bed opens up in Rehab unit over the weekend, please sign summary and transfer.   Tierney Behl C 09/10/2012

## 2012-09-10 NOTE — Clinical Social Work Placement (Deleted)
Clinical Social Work Department CLINICAL SOCIAL WORK PLACEMENT NOTE 09/10/2012  Patient:  [name], [name]  Account Number:  [id number] Admit date:  09/07/2012  Clinical Social Worker:  Etta Grandchild  Date/time:  09/10/2012 12:00 N  Clinical Social Work is seeking post-discharge placement for this patient at the following level of care:   SKILLED NURSING   (*CSW will update this form in Epic as items are completed)     Patient/family provided with [facility] Department of Clinical Social Work's list of facilities offering this level of care within the geographic area requested by the patient (or if unable, by the patient's family).  09/10/2012  Patient/family informed of their freedom to choose among providers that offer the needed level of care, that participate in Medicare, Medicaid or managed care program needed by the patient, have an available bed and are willing to accept the patient.    Patient/family informed of [facility]' ownership interest in [facility], as well as of the fact that they are under no obligation to receive care at this facility.  PASARR submitted to EDS on  PASARR number received from EDS on   FL2 transmitted to all facilities in geographic area requested by pt/family on  09/10/2012 FL2 transmitted to all facilities within larger geographic area on   Patient informed that his/her managed care company has contracts with or will negotiate with  certain facilities, including the following:     Patient/family informed of bed offers received:   Patient chooses bed at  Physician recommends and patient chooses bed at    Patient to be transferred to  on   Patient to be transferred to facility by   The following physician request were entered in Epic:   Additional Comments:

## 2012-09-10 NOTE — Progress Notes (Signed)
Patient talked about her desire to go to a rehab facility where she can get PT and get stronger. Ongoing support; presence; prayer; encouragement. Spouse and daughter were also in the room during the visit.

## 2012-09-11 DIAGNOSIS — C50419 Malignant neoplasm of upper-outer quadrant of unspecified female breast: Secondary | ICD-10-CM

## 2012-09-11 DIAGNOSIS — I10 Essential (primary) hypertension: Secondary | ICD-10-CM

## 2012-09-11 DIAGNOSIS — E46 Unspecified protein-calorie malnutrition: Secondary | ICD-10-CM

## 2012-09-11 LAB — CBC
Hemoglobin: 8.8 g/dL — ABNORMAL LOW (ref 12.0–15.0)
MCH: 26.7 pg (ref 26.0–34.0)
MCV: 88.8 fL (ref 78.0–100.0)
Platelets: 190 10*3/uL (ref 150–400)
RBC: 3.29 MIL/uL — ABNORMAL LOW (ref 3.87–5.11)

## 2012-09-11 LAB — GLUCOSE, CAPILLARY
Glucose-Capillary: 101 mg/dL — ABNORMAL HIGH (ref 70–99)
Glucose-Capillary: 100 mg/dL — ABNORMAL HIGH (ref 70–99)

## 2012-09-11 MED ORDER — WARFARIN SODIUM 5 MG PO TABS
5.0000 mg | ORAL_TABLET | Freq: Once | ORAL | Status: AC
  Administered 2012-09-11: 5 mg via ORAL
  Filled 2012-09-11: qty 1

## 2012-09-11 NOTE — Progress Notes (Signed)
Pt has home CPAP machine and has placed herself on already, pt tolerating well at this time, RT to monitor and assess as needed.

## 2012-09-11 NOTE — Plan of Care (Signed)
Problem: Phase I Progression Outcomes Goal: Initial discharge plan identified Outcome: Completed/Met Date Met:  09/11/12 [facility]

## 2012-09-11 NOTE — Progress Notes (Signed)
ANTICOAGULATION CONSULT NOTE - Follow Up Consult  Pharmacy Consult for Coumadin Indication: Atrial Fibrillation  Allergies  Allergen Reactions  . Olmesartan Medoxomil Cough    REACTION: ? if cough  . Tetracycline Hcl     Unknown reaction, too long for patient to remember   . Venlafaxine     REACTION: severe dry moouth  . Adhesive (Tape) Rash     Recent Labs  09/09/12 0615 09/10/12 0550 09/11/12 0518  HGB  --   --  8.8*  HCT  --   --  29.2*  PLT  --   --  190  LABPROT 38.4* 32.3* 23.8*  INR 4.27* 3.38* 2.24*    Estimated Creatinine Clearance: 87.1 ml/min (by C-G formula based on Cr of 0.75).   Assessment: 33 yoF on warfarin for A.Fib, Recently started on warfarin and continuing on this admission. Dose PTA was 4mg  daily. Patient also has a history of DVT in 2006.  Of note patient has a hx of chronic liver disease, and malnutrition with recent PEG placement.     Last dose of warfarin was 4/27 and has been held since secondary to supratherapeutic INR.  INR today is finally therapeutic again and dropping.    CBC stable  DI Amiodarone - monitor   Goal of Therapy:  INR 2-3 Monitor platelets by anticoagulation protocol: Yes   Plan:  1) warfarin 5mg  today - elected higher than dose per home of 4mg  due to rapid drop in INR today 2) Daily INR  Hessie Knows, PharmD, BCPS Pager [phone] 09/11/2012 10:31 AM

## 2012-09-11 NOTE — Progress Notes (Signed)
IVI GRIFFITH   DOB:[birth year]   ZO#:[id number]   JWJ#:[id number]  Subjective: No events noted overnight. Slept well and has no complaints.  No diarrhea.   Objective: middle aged white woman examined in bed, using C-pap Filed Vitals:   09/11/12 0532  BP: 109/70  Pulse: 58  Temp: 98.7 F (37.1 C)  Resp: 18    Body mass index is 37.06 kg/(m^2).  Intake/Output Summary (Last 24 hours) at 09/11/12 0744 Last data filed at 09/11/12 0600  Gross per 24 hour  Intake   2595 ml  Output    500 ml  Net   2095 ml    Awake and alert not in distress.   Oropharynx dry, lips peeled  No cervical or supraclavicular adenomapthy  Lungs clear -- auscultated anteriorly  Heart regular, no murmur heard  Abdomen soft, +BS.  MSK LE edema as before  Neuro nonfocal, well oriented, guarded affect  Breast exam: deferred  CBG (last 3)   Recent Labs  09/10/12 1150 09/10/12 1643 09/10/12 2052  GLUCAP 111* 75 79     Labs:  Lab Results  Component Value Date   WBC 7.9 09/11/2012   HGB 8.8* 09/11/2012   HCT 29.2* 09/11/2012   MCV 88.8 09/11/2012   PLT 190 09/11/2012   NEUTROABS 9.0* 09/07/2012   Basic Metabolic Panel:  Recent Labs Lab 09/05/12 0606 09/06/12 0500 09/07/12 1020 09/08/12 0500  NA 134* 133* 130* 136  K 3.8 4.5 4.9 4.6  CL 98 98 94* 101  CO2 33* 32 28 29  GLUCOSE 96 103* 108* 89  BUN 15 16 27* 27*  CREATININE 0.56 0.59 0.76 0.75  CALCIUM 7.6* 7.6* 8.0* 7.8*      Assessment: 57 y.o. Woman admitted with A Fib/ RVR, readmitted with severe diarrhea, poorly controlled pain and nausea  (1) Breast cancer: She is status post right mastectomy under the care of Dr. 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 Lymphovascular perineural invasion was identified. 2 of 18 lymph nodes were involved. Margins were clear. Pathologic staging pT3, pN1a, stage  IIIA.   (2) She was treated in the adjuvant setting with docetaxel/cyclophosphamide given every 3 weeks, with Neulasta on day 2 for granulocyte support. First treatment was given on 07/06/2012. Due to intolerance, we decreased her doses by 15% beginning cycle 2, given 07/29/2012. However, tolerance remained poor and no further chemotherapy is planned   (3) Patient will need postmastectomy radiation--to see Dr Michell Heinrich in the future.   (4) Patient started letrozole started April 2014, plan is to continue for 5 years;  (4) Comorbidities include diabetes, hypertension, chronic liver disease, and c diff negative diarrhea..  (5) Malnutrition: s/p PEG placement  (6) End of life issues: DNR in place  (7) Diarrhea: resolved now   Plan: Continued supportive care and rehab. Patient is willing to participate in PT. Anticipate discharge to inpatient rehab or SNF on Monday.    [facility] 09/11/2012

## 2012-09-12 LAB — PROTIME-INR
INR: 2.21 — ABNORMAL HIGH (ref 0.00–1.49)
Prothrombin Time: 23.6 seconds — ABNORMAL HIGH (ref 11.6–15.2)

## 2012-09-12 LAB — GLUCOSE, CAPILLARY
Glucose-Capillary: 92 mg/dL (ref 70–99)
Glucose-Capillary: 105 mg/dL — ABNORMAL HIGH (ref 70–99)

## 2012-09-12 MED ORDER — BIOTENE DRY MOUTH MT LIQD
15.0000 mL | Freq: Two times a day (BID) | OROMUCOSAL | Status: DC
  Administered 2012-09-12 – 2012-09-13 (×3): 15 mL via OROMUCOSAL

## 2012-09-12 MED ORDER — WARFARIN SODIUM 5 MG PO TABS
5.0000 mg | ORAL_TABLET | Freq: Once | ORAL | Status: AC
  Administered 2012-09-12: 5 mg via ORAL
  Filled 2012-09-12: qty 1

## 2012-09-12 NOTE — Progress Notes (Signed)
Occupational Therapy Treatment Patient Details Name: [name] MRN: [id number] DOB: 1956/02/12 Today's Date: 09/12/2012 Time: 4098-1191 OT Time Calculation (min): 30 min  OT Assessment / Plan / Recommendation Comments on Treatment Session Pt tolerated up to chair. Still having most difficulty with bed mobiity and sit to stand. Once up, she does well with walker to pivot around to chair. Encouraged UE exercises in chair.     Follow Up Recommendations  CIR    Barriers to Discharge       Equipment Recommendations  None recommended by OT    Recommendations for Other Services    Frequency Min 2X/week   Plan Discharge plan remains appropriate    Precautions / Restrictions Precautions Precautions: Fall Precaution Comments: Monitor HR Restrictions Weight Bearing Restrictions: No        ADL  Toilet Transfer: Simulated;Moderate assistance Toilet Transfer Method: Stand pivot Toileting - Clothing Manipulation and Hygiene: Simulated;Minimal assistance (to adjust gown before sitting) Where Assessed - Toileting Clothing Manipulation and Hygiene: Standing Equipment Used: Rolling walker ADL Comments: Encouraged pt to perform UE raises X10 a few sets per day to increase strength and activity tolerance. Encouaraged pt to not sit in recliner more than 1-2 hours for pressure tolerance. Donut cushion in chair. Pt motivated to be able to do as much as she can for herself.     OT Diagnosis:    OT Problem List:   OT Treatment Interventions:     OT Goals ADL Goals ADL Goal: Toilet Transfer - Progress: Progressing toward goals  Visit Information  Last OT Received On: 09/12/12 Assistance Needed: +1    Subjective Data  Subjective: i would love to get out of the bed Patient Stated Goal: get stronger and return to work   Prior Functioning       Cognition  Cognition Arousal/Alertness: Awake/alert Overall Cognitive Status: Within Functional Limits for tasks assessed    Mobility  Bed Mobility Bed Mobility: Supine to Sit Supine to Sit: 3: Mod assist;HOB elevated;With rails Details for Bed Mobility Assistance: increased time and assist for trunk to upright. assist for scooting to EOB also. Transfers Transfers: Sit to Stand;Stand to Sit Sit to Stand: 3: Mod assist;With upper extremity assist;From bed Stand to Sit: 4: Min assist;With upper extremity assist;To chair/3-in-1 Details for Transfer Assistance: verbal cues for LE placement and hand placement. Pt tends to keep feet too far out in front before standing.    Exercises      Balance     End of Session OT - End of Session Activity Tolerance: Patient tolerated treatment well Patient left: in chair;with call bell/phone within reach  GO     Lennox Laity [phone] 09/12/2012, 11:46 AM

## 2012-09-12 NOTE — Progress Notes (Signed)
Patient [name]      DOB: [birth year]      AVW:[id number]   Emotional Support offered to patient and spouse. They are looking forward to possible transfer to CIR on Monday, if not expressed resolute about maybe trying [facility].  Rhonda Wax L. Ladona Ridgel, MD MBA The Palliative Medicine Team at [facility] Phone: [phone] Pager: [phone]

## 2012-09-12 NOTE — Progress Notes (Signed)
Medical Oncology  Addendum to full note today: bleeding ? Vaginal. Note had been on megace for appetite, which was DCd ~ 3 -4 days ago. She seems to have been on megace at least several weeks, so this may be withdrawal bleeding.  Ila Mcgill, MD

## 2012-09-12 NOTE — Progress Notes (Signed)
  09/12/2012, 10:26 AM  Hospital day: 6 Antibiotics: none Chemotherapy: last 07-29-2012  Patient seen with husband here, discussed with RN.  Subjective: Seems to be feeling a little better, able to sit up in chair x 5 hours yesterday. Tongue is sore and she is requesting mouthwash used previously, probably Biotene which I have changed to bid. She denies pain other than soreness at G tube; husband notices that the IV morphine 1 mg QID makes her very sleepy. She denies nausea now, wonders why she is on scheduled phenergan, however symptoms have been so difficult to manage that we will not change this now. She is tolerating present feedings by G tube, only able to drink OJ and water by mouth. Heels are uncomfortable, not always keeping them off of mattress. Husband is concerned about LE swelling, which we have discussed. She is able to sleep well at night now using relaxation CD.   Objective: Vital signs in last 24 hours: Blood pressure 102/58, pulse 70, temperature 99.5 F (37.5 C), temperature source Axillary, resp. rate 16, height 5\' 4"  (1.626 m), weight 216 lb (97.977 kg), SpO2 99.00%.   Intake/Output from previous day: 05/03 0701 - 05/04 0700 In: 1470 [P.O.:120] Out: 350 [Urine:350] Intake/Output this shift: Total I/O In: 13 [I.V.:13] Out: -   Physical exam: chronically ill appearing lady, looks older than stated age. Alert, appropriate conversation, respirations not labored lying at 30 degrees on RA. PERRL, not icteric. Mouth dry, no lesions on tongue, teeth not clean. No JVD. Lungs without wheezes or rales ant/lat. Heart RRR, no gallop. PAC site ok. Abdomen active BS, full, not tender. G tube dressings with minimal drainage. LE 3+ edema looks like anasarca bilaterally to thighs, no clear cords, feet warm. Dry dressings on heels bilat, which are not propped off of air mattress now. Blood on inner upper thighs, none in foley.  Lab Results:  Recent Labs  09/11/12 0518  WBC 7.9  HGB  8.8*  HCT 29.2*  PLT 190   BMET No results found for this basename: NA, K, CL, CO2, GLUCOSE, BUN, CREATININE, CALCIUM,  in the last 72 hours Last CMET 09-08-12 with albumin 1.7  INR today 2.2 Studies/Results: No results found.   Assessment/Plan: 1.severe deconditioning and malnutrition related to recent breast cancer interventions with other significant comorbidities: still not tolerating po intake, with tube feedings via PEG. Multiple related symptoms seem controlled on present medications by Dr [name]. Plan is for inpatient rehab at [facility]. Note she did not tolerate previous attempts at care at home or in SNF. 2.IIIA right breast cancer: post mastectomy 06-08-12 followed by 2 cycles of docetaxel/ cytoxan last 07-29-12. She was not able to tolerate chemo and this has been [facility], patient now on Femara. Local RT anticipated when PS allows. 3.diabetes 4.atrial fibrillation on coumadin, managed by pharmacy inpatient, INR good today 5.chronic liver disease, C diff negative diarrhea resolved  LIVESAY,LENNIS P

## 2012-09-12 NOTE — Progress Notes (Addendum)
Pt sat up in chair with feet elevated x 2 hours. Appetite poor. Repositioned q 2 hour. Barrier cream applied to peri area.

## 2012-09-12 NOTE — Progress Notes (Signed)
ANTICOAGULATION CONSULT NOTE - Follow Up Consult  Pharmacy Consult for Coumadin Indication: Atrial Fibrillation  Allergies  Allergen Reactions  . Olmesartan Medoxomil Cough    REACTION: ? if cough  . Tetracycline Hcl     Unknown reaction, too long for patient to remember   . Venlafaxine     REACTION: severe dry moouth  . Adhesive (Tape) Rash     Recent Labs  09/10/12 0550 09/11/12 0518 09/12/12 0542  HGB  --  8.8*  --   HCT  --  29.2*  --   PLT  --  190  --   LABPROT 32.3* 23.8* 23.6*  INR 3.38* 2.24* 2.21*    Estimated Creatinine Clearance: 87.1 ml/min (by C-G formula based on Cr of 0.75).   Assessment: 13 yoF on warfarin for A.Fib, Recently started on warfarin and continuing on this admission. Dose PTA was 4mg  daily. Patient also has a history of DVT in 2006.  Of note patient has a hx of chronic liver disease, and malnutrition with recent PEG placement.     Last dose of warfarin was 4/27 and has been held since secondary to supratherapeutic INR.  INR therapeutic after 5mg  yesterday.    CBC stable  DI Amiodarone - monitor   Goal of Therapy:  INR 2-3 Monitor platelets by anticoagulation protocol: Yes   Plan:  1) Repeat warfarin 5mg  today 2) Daily INR  Hessie Knows, PharmD, BCPS Pager [phone] 09/12/2012 10:19 AM

## 2012-09-13 ENCOUNTER — Encounter: Payer: Self-pay | Admitting: Family

## 2012-09-13 ENCOUNTER — Inpatient Hospital Stay (HOSPITAL_COMMUNITY)
Admission: RE | Admit: 2012-09-13 | Discharge: 2012-09-28 | DRG: 945 | Disposition: A | Discharge status: Home Health | Payer: 59 | Source: Intra-hospital | Attending: Physical Medicine & Rehabilitation | Admitting: Physical Medicine & Rehabilitation

## 2012-09-13 DIAGNOSIS — D649 Anemia, unspecified: Secondary | ICD-10-CM | POA: Diagnosis present

## 2012-09-13 DIAGNOSIS — Z853 Personal history of malignant neoplasm of breast: Secondary | ICD-10-CM

## 2012-09-13 DIAGNOSIS — E46 Unspecified protein-calorie malnutrition: Secondary | ICD-10-CM | POA: Diagnosis present

## 2012-09-13 DIAGNOSIS — C50919 Malignant neoplasm of unspecified site of unspecified female breast: Secondary | ICD-10-CM

## 2012-09-13 DIAGNOSIS — C50911 Malignant neoplasm of unspecified site of right female breast: Secondary | ICD-10-CM

## 2012-09-13 DIAGNOSIS — I10 Essential (primary) hypertension: Secondary | ICD-10-CM | POA: Diagnosis present

## 2012-09-13 DIAGNOSIS — E039 Hypothyroidism, unspecified: Secondary | ICD-10-CM | POA: Diagnosis present

## 2012-09-13 DIAGNOSIS — I509 Heart failure, unspecified: Secondary | ICD-10-CM | POA: Diagnosis present

## 2012-09-13 DIAGNOSIS — Z5189 Encounter for other specified aftercare: Principal | ICD-10-CM

## 2012-09-13 DIAGNOSIS — R040 Epistaxis: Secondary | ICD-10-CM | POA: Diagnosis present

## 2012-09-13 DIAGNOSIS — E119 Type 2 diabetes mellitus without complications: Secondary | ICD-10-CM | POA: Diagnosis present

## 2012-09-13 DIAGNOSIS — I5033 Acute on chronic diastolic (congestive) heart failure: Secondary | ICD-10-CM | POA: Diagnosis not present

## 2012-09-13 DIAGNOSIS — R5381 Other malaise: Secondary | ICD-10-CM | POA: Diagnosis present

## 2012-09-13 DIAGNOSIS — E785 Hyperlipidemia, unspecified: Secondary | ICD-10-CM | POA: Diagnosis present

## 2012-09-13 DIAGNOSIS — E876 Hypokalemia: Secondary | ICD-10-CM | POA: Diagnosis present

## 2012-09-13 DIAGNOSIS — N390 Urinary tract infection, site not specified: Secondary | ICD-10-CM | POA: Diagnosis not present

## 2012-09-13 DIAGNOSIS — G4733 Obstructive sleep apnea (adult) (pediatric): Secondary | ICD-10-CM | POA: Diagnosis present

## 2012-09-13 DIAGNOSIS — R609 Edema, unspecified: Secondary | ICD-10-CM

## 2012-09-13 DIAGNOSIS — I4891 Unspecified atrial fibrillation: Secondary | ICD-10-CM | POA: Diagnosis present

## 2012-09-13 DIAGNOSIS — B961 Klebsiella pneumoniae [K. pneumoniae] as the cause of diseases classified elsewhere: Secondary | ICD-10-CM | POA: Diagnosis not present

## 2012-09-13 DIAGNOSIS — E669 Obesity, unspecified: Secondary | ICD-10-CM

## 2012-09-13 DIAGNOSIS — E032 Hypothyroidism due to medicaments and other exogenous substances: Secondary | ICD-10-CM

## 2012-09-13 DIAGNOSIS — D509 Iron deficiency anemia, unspecified: Secondary | ICD-10-CM

## 2012-09-13 DIAGNOSIS — I5032 Chronic diastolic (congestive) heart failure: Secondary | ICD-10-CM

## 2012-09-13 DIAGNOSIS — K219 Gastro-esophageal reflux disease without esophagitis: Secondary | ICD-10-CM | POA: Diagnosis present

## 2012-09-13 DIAGNOSIS — F411 Generalized anxiety disorder: Secondary | ICD-10-CM | POA: Diagnosis not present

## 2012-09-13 DIAGNOSIS — F329 Major depressive disorder, single episode, unspecified: Secondary | ICD-10-CM

## 2012-09-13 DIAGNOSIS — F430 Acute stress reaction: Secondary | ICD-10-CM | POA: Diagnosis present

## 2012-09-13 DIAGNOSIS — K7460 Unspecified cirrhosis of liver: Secondary | ICD-10-CM | POA: Diagnosis present

## 2012-09-13 DIAGNOSIS — R197 Diarrhea, unspecified: Secondary | ICD-10-CM | POA: Diagnosis present

## 2012-09-13 DIAGNOSIS — Z931 Gastrostomy status: Secondary | ICD-10-CM

## 2012-09-13 DIAGNOSIS — G4700 Insomnia, unspecified: Secondary | ICD-10-CM | POA: Diagnosis present

## 2012-09-13 DIAGNOSIS — Z87891 Personal history of nicotine dependence: Secondary | ICD-10-CM

## 2012-09-13 DIAGNOSIS — R627 Adult failure to thrive: Secondary | ICD-10-CM | POA: Diagnosis present

## 2012-09-13 DIAGNOSIS — F341 Dysthymic disorder: Secondary | ICD-10-CM

## 2012-09-13 HISTORY — DX: Other malaise: R53.81

## 2012-09-13 LAB — GLUCOSE, CAPILLARY
Glucose-Capillary: 113 mg/dL — ABNORMAL HIGH (ref 70–99)
Glucose-Capillary: 120 mg/dL — ABNORMAL HIGH (ref 70–99)

## 2012-09-13 LAB — PROTIME-INR: INR: 2.77 — ABNORMAL HIGH (ref 0.00–1.49)

## 2012-09-13 MED ORDER — MORPHINE SULFATE 2 MG/ML IJ SOLN: 1.0000 mg | INTRAMUSCULAR | Status: DC | PRN

## 2012-09-13 MED ORDER — METOPROLOL TARTRATE 50 MG PO TABS
75.0000 mg | ORAL_TABLET | Freq: Two times a day (BID) | ORAL | Status: DC
  Administered 2012-09-13 – 2012-09-18 (×10): 75 mg via ORAL
  Filled 2012-09-13 (×16): qty 1

## 2012-09-13 MED ORDER — BISACODYL 10 MG RE SUPP: 10.0000 mg | Freq: Every day | RECTAL | Status: DC | PRN

## 2012-09-13 MED ORDER — ESCITALOPRAM OXALATE 20 MG PO TABS
20.0000 mg | ORAL_TABLET | Freq: Every day | ORAL | Status: DC
  Administered 2012-09-13 – 2012-09-27 (×15): 20 mg via ORAL
  Filled 2012-09-13 (×16): qty 1

## 2012-09-13 MED ORDER — LEVOTHYROXINE SODIUM 125 MCG PO TABS
125.0000 ug | ORAL_TABLET | Freq: Every day | ORAL | Status: DC
  Administered 2012-09-14 – 2012-09-28 (×14): 125 ug via ORAL
  Filled 2012-09-13 (×17): qty 1

## 2012-09-13 MED ORDER — INSULIN ASPART 100 UNIT/ML ~~LOC~~ SOLN
0.0000 [IU] | Freq: Three times a day (TID) | SUBCUTANEOUS | Status: DC
  Administered 2012-09-14 – 2012-09-22 (×3): 1 [IU] via SUBCUTANEOUS

## 2012-09-13 MED ORDER — JEVITY 1.2 CAL PO LIQD
237.0000 mL | Freq: Every day | ORAL | Status: DC
  Administered 2012-09-13 – 2012-09-14 (×3): 237 mL
  Filled 2012-09-13 (×13): qty 237

## 2012-09-13 MED ORDER — FREE WATER
100.0000 mL | Freq: Three times a day (TID) | Status: DC
  Administered 2012-09-13 – 2012-09-14 (×2): 100 mL

## 2012-09-13 MED ORDER — ALUM & MAG HYDROXIDE-SIMETH 200-200-20 MG/5ML PO SUSP
15.0000 mL | ORAL | Status: DC | PRN
  Administered 2012-09-22 – 2012-09-25 (×2): 15 mL via ORAL
  Filled 2012-09-13 (×2): qty 30

## 2012-09-13 MED ORDER — PROMETHAZINE HCL 12.5 MG PO TABS
12.5000 mg | ORAL_TABLET | Freq: Four times a day (QID) | ORAL | Status: DC | PRN
  Administered 2012-09-24 – 2012-09-27 (×2): 12.5 mg via ORAL
  Filled 2012-09-13 (×2): qty 1

## 2012-09-13 MED ORDER — FLEET ENEMA 7-19 GM/118ML RE ENEM: 1.0000 | ENEMA | Freq: Once | RECTAL | Status: AC | PRN

## 2012-09-13 MED ORDER — LETROZOLE 2.5 MG PO TABS
2.5000 mg | ORAL_TABLET | Freq: Every day | ORAL | Status: DC
  Administered 2012-09-14 – 2012-09-28 (×15): 2.5 mg via ORAL
  Filled 2012-09-13 (×19): qty 1

## 2012-09-13 MED ORDER — PRO-STAT SUGAR FREE PO LIQD
30.0000 mL | Freq: Every day | ORAL | Status: DC
  Administered 2012-09-13: 30 mL
  Filled 2012-09-13 (×2): qty 30

## 2012-09-13 MED ORDER — AMIODARONE HCL 200 MG PO TABS
200.0000 mg | ORAL_TABLET | Freq: Every day | ORAL | Status: DC
  Administered 2012-09-16: 200 mg via ORAL
  Filled 2012-09-13 (×2): qty 1

## 2012-09-13 MED ORDER — MORPHINE SULFATE 15 MG PO TABS
15.0000 mg | ORAL_TABLET | ORAL | Status: DC | PRN
  Filled 2012-09-13: qty 1

## 2012-09-13 MED ORDER — ACETAMINOPHEN 325 MG PO TABS
325.0000 mg | ORAL_TABLET | ORAL | Status: DC | PRN
  Administered 2012-09-14 – 2012-09-25 (×7): 650 mg via ORAL
  Filled 2012-09-13 (×7): qty 2

## 2012-09-13 MED ORDER — BIOTENE DRY MOUTH MT LIQD
15.0000 mL | Freq: Two times a day (BID) | OROMUCOSAL | Status: DC
  Administered 2012-09-13 – 2012-09-28 (×29): 15 mL via OROMUCOSAL

## 2012-09-13 MED ORDER — GUAIFENESIN-DM 100-10 MG/5ML PO SYRP
5.0000 mL | ORAL_SOLUTION | Freq: Four times a day (QID) | ORAL | Status: DC | PRN
  Administered 2012-09-17 – 2012-09-23 (×9): 10 mL via ORAL
  Filled 2012-09-13 (×10): qty 10

## 2012-09-13 MED ORDER — PROMETHAZINE HCL 25 MG/ML IJ SOLN: 12.5000 mg | Freq: Four times a day (QID) | INTRAMUSCULAR | Status: DC

## 2012-09-13 MED ORDER — BUPROPION HCL ER (SR) 150 MG PO TB12
150.0000 mg | ORAL_TABLET | Freq: Every morning | ORAL | Status: DC
  Administered 2012-09-14 – 2012-09-28 (×15): 150 mg via ORAL
  Filled 2012-09-13 (×15): qty 1

## 2012-09-13 MED ORDER — LOPERAMIDE HCL 2 MG PO CAPS
2.0000 mg | ORAL_CAPSULE | Freq: Four times a day (QID) | ORAL | Status: DC | PRN
  Filled 2012-09-13: qty 1

## 2012-09-13 MED ORDER — WARFARIN - PHARMACIST DOSING INPATIENT
Freq: Every day | Status: DC
  Administered 2012-09-13 – 2012-09-14 (×2)

## 2012-09-13 MED ORDER — CHOLESTYRAMINE 4 G PO PACK
4.0000 g | PACK | Freq: Two times a day (BID) | ORAL | Status: DC | PRN
  Filled 2012-09-13: qty 1

## 2012-09-13 MED ORDER — WARFARIN SODIUM 4 MG PO TABS
4.0000 mg | ORAL_TABLET | Freq: Once | ORAL | Status: AC
  Administered 2012-09-13: 4 mg via ORAL
  Filled 2012-09-13: qty 1

## 2012-09-13 MED ORDER — OLANZAPINE 2.5 MG PO TABS
2.5000 mg | ORAL_TABLET | Freq: Every day | ORAL | Status: DC
  Administered 2012-09-13 – 2012-09-27 (×15): 2.5 mg via ORAL
  Filled 2012-09-13 (×16): qty 1

## 2012-09-13 MED ORDER — WARFARIN SODIUM 4 MG PO TABS
4.0000 mg | ORAL_TABLET | Freq: Once | ORAL | Status: DC
  Filled 2012-09-13: qty 1

## 2012-09-13 MED ORDER — ASPIRIN EC 81 MG PO TBEC
81.0000 mg | DELAYED_RELEASE_TABLET | Freq: Every morning | ORAL | Status: DC
  Administered 2012-09-14 – 2012-09-20 (×7): 81 mg via ORAL
  Filled 2012-09-13 (×8): qty 1

## 2012-09-13 MED ORDER — AMIODARONE HCL 200 MG PO TABS
200.0000 mg | ORAL_TABLET | Freq: Two times a day (BID) | ORAL | Status: AC
  Administered 2012-09-13 – 2012-09-15 (×5): 200 mg via ORAL
  Filled 2012-09-13 (×6): qty 1

## 2012-09-13 MED ORDER — ADULT MULTIVITAMIN W/MINERALS CH
1.0000 | ORAL_TABLET | Freq: Every day | ORAL | Status: DC
  Administered 2012-09-14 – 2012-09-28 (×15): 1 via ORAL
  Filled 2012-09-13 (×17): qty 1

## 2012-09-13 MED ORDER — ONDANSETRON 8 MG/NS 50 ML IVPB
8.0000 mg | Freq: Three times a day (TID) | INTRAVENOUS | Status: DC | PRN
  Filled 2012-09-13: qty 8

## 2012-09-13 MED ORDER — SODIUM CHLORIDE 0.9 % IJ SOLN
10.0000 mL | INTRAMUSCULAR | Status: DC | PRN
  Administered 2012-09-13 – 2012-09-28 (×17): 10 mL

## 2012-09-13 NOTE — Telephone Encounter (Signed)
Received call from [place] in HR on Friday stating she needs a letter stating that pt is unable to return to work at this time. Reports that Francesco Sor will send appropriate papers for [employer].

## 2012-09-13 NOTE — Plan of Care (Signed)
Overall Plan of Care [facility]) Patient Details Name: [name] MRN: [id number] DOB: [birth year]  Diagnosis:  Deconditioning related to breast cancer, multiple medical    Co-morbidities: malnutrition, anxiety, chf  Functional Problem List  Patient demonstrates impairments in the following areas: Balance, Bladder, Edema, Endurance, Pain, Safety and Skin Integrity  Basic ADL's: grooming, bathing, dressing and toileting Advanced ADL's: simple meal preparation and light housekeeping  Transfers:  bed mobility, bed to chair, toilet, tub/shower, car and furniture Locomotion:  ambulation, wheelchair mobility and stairs  Additional Impairments:  Leisure Awareness  Anticipated Outcomes Item Anticipated Outcome  Eating/Swallowing    Basic self-care  Supervision  Tolieting  Supervision   Bowel/Bladder  Mod I  Transfers  Supervision basic transfers; min A for car and bed mobility (LE management)  Locomotion  Supervision household gait; min A for steps for home entry  Communication    Cognition    Pain  <3  Safety/Judgment    Other     Therapy Plan: PT Intensity: Minimum of 1-2 x/day ,45 to 90 minutes PT Frequency: 5 out of 7 days (monitor if need to be 15/7) PT Duration Estimated Length of Stay: 2 weeks OT Intensity: Minimum of 1-2 x/day, 45 to 90 minutes OT Frequency: 5 out of 7 days OT Duration/Estimated Length of Stay: ~2 weeks      Team Interventions: Item RN PT OT SLP SW TR Other  Self Care/Advanced ADL Retraining  x x      Neuromuscular Re-Education  x       Therapeutic Activities  x x      UE/LE Strength Training/ROM  x x      UE/LE Coordination Activities  x x      Visual/Perceptual Remediation/Compensation         DME/Adaptive Equipment Instruction  x x      Therapeutic Exercise  x x      Balance/Vestibular Training  x x      Patient/Family Education  x x      Cognitive Remediation/Compensation         Functional Mobility Training  x x       Ambulation/Gait Training  x x      Stair Training  x       Wheelchair Propulsion/Positioning  x x      Functional Tourist information centre manager Reintegration  x x      Dysphagia/Aspiration Film/video editor         Bladder Management x        Bowel Management x        Disease Management/Prevention  x       Pain Management x x x      Medication Management         Skin Care/Wound Management x x x      Splinting/Orthotics  x x      Discharge Planning x x x      Psychosocial Support x x x                             Team Discharge Planning: Destination: PT-Home ,OT- Home , SLP-  Projected Follow-up: PT-Home health PT, OT-  Home health OT, SLP-  Projected Equipment Needs: PT-None recommended by PT ((already has RW and w/c)), OT- Tub/shower bench, SLP-   Patient/family involved in discharge planning:  PT- Patient;Family member/caregiver,  OT-Patient, SLP-   MD ELOS: 2 weeks Medical Rehab Prognosis:  Good Assessment: The patient has been admitted for CIR therapies. The team will be addressing, functional mobility, strength, stamina, balance, safety, adaptive techniques/equipment, self-care, bowel and bladder mgt, patient and caregiver education, anxiety, coping. Goals have been set at supervision to modified independent.    Ranelle Oyster, MD, FAAPMR      See Team Conference Notes for weekly updates to the plan of care

## 2012-09-13 NOTE — Progress Notes (Signed)
Patient is a delightful lady of faith who spent most of her time talking about two other Chaplains, Donnelly Stager and [employer], for whom she said only the best of things.  Patient clung to her prayer cloth all the time we talked.  Another was present whom Chaplain presumed is her son, very devoted and very concerned.  Likely some counseling, now or in the event of her death might be helpful for him.  Patient was positive and stated she had been really low, feeling like she wanted to die.  She related that [employer] helped her see things differently.  In our conversation, she stated she will continue to seek to live until it is over.  We spoke also of things she liked to do.  Rema Jasmine, Chaplain Pager: [phone]

## 2012-09-13 NOTE — Progress Notes (Signed)
[name]   DOB:[birth year]   HY#:[id number]   EXB#:[id number]  Subjective: [name] is hoping to move to CIR today. Tolerating feeds 6/d w/o diarrhea. Not eating much--taste perversion and nausea persist. Husband in room  Objective: middle aged white [name] examined in bed.  Filed Vitals:   09/13/12 0551  BP: 120/67  Pulse: 66  Temp: 98.6 F (37 C)  Resp: 16    Body mass index is 37.06 kg/(m^2).  Intake/Output Summary (Last 24 hours) at 09/13/12 0752 Last data filed at 09/13/12 1027  Gross per 24 hour  Intake   5103 ml  Output    961 ml  Net   4142 ml     Oropharynx dry  No cervical or supraclavicular adenomapthy  Lungs clear -- auscultated anteriorly  Heart regular today, no murmur heard  Abdomen soft, +BS, PEG in place, dressing dry and clean  MSK LE edema as before  Neuro nonfocal, well oriented, positive affect  Breast exam: deferred  CBG (last 3)   Recent Labs  09/12/12 1139 09/12/12 1704 09/12/12 2111  GLUCAP 121* 92 105*     Labs:  Lab Results  Component Value Date   WBC 7.9 09/11/2012   HGB 8.8* 09/11/2012   HCT 29.2* 09/11/2012   MCV 88.8 09/11/2012   PLT 190 09/11/2012   NEUTROABS 9.0* 09/07/2012    @LASTCHEMISTRY @  Urine Studies No results found for this basename: UACOL, UAPR, USPG, UPH, UTP, UGL, UKET, UBIL, UHGB, UNIT, UROB, ULEU, UEPI, UWBC, URBC, UBAC, CAST, CRYS, UCOM, BILUA,  in the last 72 hours  Basic Metabolic Panel:  Recent Labs Lab 09/07/12 1020 09/08/12 0500  NA 130* 136  K 4.9 4.6  CL 94* 101  CO2 28 29  GLUCOSE 108* 89  BUN 27* 27*  CREATININE 0.76 0.75  CALCIUM 8.0* 7.8*   GFR Estimated Creatinine Clearance: 87.1 ml/min (by C-G formula based on Cr of 0.75). Liver Function Tests:  Recent Labs Lab 09/07/12 1020 09/08/12 0500  AST 40* 38*  ALT 27 27  ALKPHOS 213* 180*  BILITOT 0.3 0.4  PROT 5.7* 5.5*  ALBUMIN 1.6* 1.7*   No results found for this basename: LIPASE, AMYLASE,  in the last 168 hours No results found for  this basename: AMMONIA,  in the last 168 hours Coagulation profile  Recent Labs Lab 09/09/12 0615 09/10/12 0550 09/11/12 0518 09/12/12 0542 09/13/12 0645  INR 4.27* 3.38* 2.24* 2.21* 2.77*    CBC:  Recent Labs Lab 09/07/12 1020 09/08/12 0500 09/11/12 0518  WBC 11.9* 10.1 7.9  NEUTROABS 9.0*  --   --   HGB 9.6* 8.9* 8.8*  HCT 30.1* 28.7* 29.2*  MCV 85.8 87.0 88.8  PLT 249 210 190   Cardiac Enzymes: No results found for this basename: CKTOTAL, CKMB, CKMBINDEX, TROPONINI,  in the last 168 hours BNP: No components found with this basename: POCBNP,  CBG:  Recent Labs Lab 09/11/12 2149 09/12/12 0754 09/12/12 1139 09/12/12 1704 09/12/12 2111  GLUCAP 100* 78 121* 92 105*   D-Dimer No results found for this basename: DDIMER,  in the last 72 hours Hgb A1c No results found for this basename: HGBA1C,  in the last 72 hours Lipid Profile No results found for this basename: CHOL, HDL, LDLCALC, TRIG, CHOLHDL, LDLDIRECT,  in the last 72 hours Thyroid function studies No results found for this basename: TSH, T4TOTAL, FREET3, T3FREE, THYROIDAB,  in the last 72 hours Anemia work up No results found for this basename: VITAMINB12, FOLATE, FERRITIN,  TIBC, IRON, RETICCTPCT,  in the last 72 hours Microbiology No results found for this or any previous visit (from the past 240 hour(s)).    Studies:  No results found.    Assessment: 57 y.o. [name] admitted with A Fib/ RVR, readmitted with severe diarrhea, poorly controlled pain and nausea  (1) status post right mastectomy under the care of Dr. 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 Lymphovascular perineural invasion was identified. 2 of 18 lymph nodes were involved. Margins were clear. Pathologic staging pT3, pN1a, stage IIIA.   (2) treated in the adjuvant setting with  docetaxel/cyclophosphamide given every 3 weeks, with Neulasta on day 2 for granulocyte support. First treatment was given on 07/06/2012. Due to intolerance, we decreased her doses by 15% beginning cycle 2, given 07/29/2012. However, tolerance remained poor and no further chemotherapy is planned   (3) patient will need postmastectomy radiation--.Dr Michell Heinrich to reassess once outpatient  (4) letrozole started April 2014, plan is to continue for 5 years;  (4) comorbidities  (a) diabetes-- currently on SSI only as caloric intake poor; continue to monitor  (b) hypertension--controlled  (c) A fib--anticoagulated; currently RRR  (d) chronic liver disease--aware  (e) c diff negative diarrhea--resolved; questran and imodium now PRN  (f) malnutrition: s/p PEG placement; tolerating 6 feeds/day w/o diarrhea; can be transtioned so all nutrition/ hydration is by PEG (so IVF could be stopped)  (g) end of life issues: DNR in place, but patient has no terminal disease, her problems being either curable or chronic  (h) pain: largely resolved; have changed morphine to PRN   Plan: [name] is very motivated to ambulate, get stronger, and go home. I think she is a good candidate for CIR as noted in PT and OT evals. Awaiting final evaluation and placement decision, hopefully today  Tarrin Lebow C 09/13/2012

## 2012-09-13 NOTE — Progress Notes (Signed)
ANTICOAGULATION CONSULT NOTE - Follow Up Consult  Pharmacy Consult for Coumadin Indication: Atrial Fibrillation  Allergies  Allergen Reactions  . Olmesartan Medoxomil Cough    REACTION: ? if cough  . Tetracycline Hcl     Unknown reaction, too long for patient to remember   . Venlafaxine     REACTION: severe dry moouth  . Adhesive (Tape) Rash    Recent Labs  09/11/12 0518 09/12/12 0542 09/13/12 0645  HGB 8.8*  --   --   HCT 29.2*  --   --   PLT 190  --   --   LABPROT 23.8* 23.6* 27.9*  INR 2.24* 2.21* 2.77*   Estimated Creatinine Clearance: 87.1 ml/min (by C-G formula based on Cr of 0.75).  Assessment: 13 yoF on warfarin for A.Fib, Recently started on warfarin and continuing on this admission. Dose PTA was 4mg  daily. Patient also has a history of DVT in 2006.  Of note patient has a hx of chronic liver disease, and malnutrition with recent PEG placement.     Current tube feeds with Jevity 1.2 boluses 6x/day + Prostat, free water  INR elevated on admit, likely due to recent addition of Amiodarone in loading phase and malnutrition  INR = 2.77 today after 5,5 mg Warfarin    CBC low, stable. Bleeding noted probable vaginal origin with discontinuation of Megace (see Dr. Darrold Span note)  Goal of Therapy:  INR 2-3   Plan:   Warfarin 4mg  today  Daily PT/INR  CBC tomorrow  Planning transfer to [facility] at [facility] 5/5  Follow up c/o bleeding  Otho Bellows PharmD Pager [phone] 09/13/2012, 10:14 AM

## 2012-09-13 NOTE — H&P (Signed)
Physical Medicine and Rehabilitation Admission H&P  Chief Complaint   Patient presents with   .  Deconditioning due to multiple medical issues, FTT,   :  HPI: [name] is a 57 y.o. female with history of breast cancer (diagnosed 01/14) with intolerance of chemo, Atrial fibrillation, malnutrition requiring PEG 08/27/12 during recent admission,;who was discharged to home 09/06/12. Family reported difficulty getting patient out of the car and into the home due to inability walk and BLE weakness. She was readmitted on 04/29 with severely depressed mood, complaints of weakness and refusal to continue any further interventions. Palliative care and psychiatary consulted for assistance and patient was agreeable to start medications to help with mood stabilization as well as continued medical interventions. She was started on Zyprexa to help with insomnia and lexapro dose increased to help with mood stabilization. XRT to be initiated in the future. Po intake remains poor but she is tolerating TF without recurrent diarrhea. Therapies initiated and CIR recommended. Patient admitted today for progressive therapies.    ROS: Depressed mood, decreased appetite, fatigue. Improved diarrhea. Sob with activity.  A full 12 point ROS was performed and other pertinent positives are above and if not otherwise mentioned are negative.  Past Medical History   Diagnosis  Date   .  Depression    .  DVT (deep venous thrombosis)      hx of on HRT left leg ~2006   .  GERD (gastroesophageal reflux disease)    .  Hyperlipidemia    .  Hypertension    .  Hypothyroidism    .  PPD positive, treated      rx inh   .  OSA on CPAP    .  Diabetes mellitus    .  Liver disease, chronic, with cirrhosis      ? autoimmune   .  Breast cancer      a. 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  DJD (degenerative joint disease) of  lumbar spine     Past Surgical History   Procedure  Laterality  Date   .  Tubal ligation     .  Cholecystectomy     .  Foot surgery     .  Eye surgery     .  Cataract extraction     .  Percutaneous liver biopsy     .  Breast biopsy       left breast   .  Mastectomy modified radical   06/08/2012     Procedure: MASTECTOMY MODIFIED RADICAL; Surgeon: Mariella Saa, MD; Location: [facility] OR; Service: General; Laterality: Right;   .  Portacath placement   06/08/2012     Procedure: INSERTION PORT-A-CATH; Surgeon: Mariella Saa, MD; Location: [facility] OR; Service: General; Laterality: Left;   .  Peg tube placement      Family History   Problem  Relation  Age of Onset   .  Diabetes  Mother    .  Hypertension  Mother    .  Arthritis  Mother    .  Heart disease  Mother    .  Heart failure  Mother    .  Other  Mother      benign breast mass   .  Stroke  Father    .  Heart disease  Father    .  Diabetes  Paternal Grandmother    .  Colon cancer  Paternal Grandfather     Social History: Married. Works for [employer] reports that she has quit smoking. She has never used smokeless tobacco. She reports that she does not drink alcohol or use illicit drugs.  Allergies   Allergen  Reactions   .  Olmesartan Medoxomil  Cough     REACTION: ? if cough   .  Tetracycline Hcl      Unknown reaction, too long for patient to remember   .  Venlafaxine      REACTION: severe dry moouth   .  Adhesive (Tape)  Rash    Medications Prior to Admission   Medication  Sig  Dispense  Refill   .  amiodarone (PACERONE) 200 MG tablet  Take 2 tablets (400 mg total) by mouth 2 (two) times daily. Take 2 tablets (400mg ) 2 times daily x 4 days, then 1 tablet (200mg ) daily  60 tablet  0   .  antiseptic oral rinse (BIOTENE) LIQD  15 mLs by Mouth Rinse route 2 (two) times daily as needed.  100 mL  3   .  aspirin EC 81 MG tablet  Take 81 mg by mouth every morning.     [name]  atorvastatin (LIPITOR) 10 MG tablet  Take 10 mg by mouth at  bedtime.  90 tablet  1   .  buPROPion (WELLBUTRIN SR) 150 MG 12 hr tablet  Take 150 mg by mouth every morning.     .  cholestyramine (QUESTRAN) 4 G packet  Take 1 packet (4 g total) by mouth 2 (two) times daily.  60 each  3   .  escitalopram (LEXAPRO) 10 MG tablet  Take 1 tablet (10 mg total) by mouth at bedtime.  31 tablet  0   .  feeding supplement (PRO-STAT SUGAR FREE 64) LIQD  Place 30 mLs into feeding tube 3 (three) times daily with meals.  900 mL  0   .  feeding supplement (RESOURCE BREEZE) LIQD  Take 1 Container by mouth 2 (two) times daily between meals.  60 Container  3   .  furosemide (LASIX) 80 MG tablet  Take 1 tablet (80 mg total) by mouth 2 (two) times daily.  62 tablet  0   .  insulin aspart (NOVOLOG) 100 UNIT/ML injection  Inject 0-15 Units into the skin 3 (three) times daily with meals.  1 vial  3   .  letrozole (FEMARA) 2.5 MG tablet  Take 1 tablet (2.5 mg total) by mouth daily.  30 tablet  3   .  levothyroxine (SYNTHROID, LEVOTHROID) 125 MCG tablet  Take 125 mcg by mouth daily before breakfast.     .  lidocaine-prilocaine (EMLA) cream  Apply 1 application topically as needed (for port access).     .  megestrol (MEGACE) 400 MG/10ML suspension  Take 10 mLs (400 mg total) by mouth 2 (two) times daily.  480 mL  0   .  metoprolol tartrate (LOPRESSOR) 25 MG tablet  Take 3 tablets (75 mg total) by mouth 2 (two) times daily.  180 tablet  0   .  Multiple Vitamin (MULTIVITAMIN WITH MINERALS) TABS  Take 1 tablet by mouth daily.  30 tablet  3   .  Nutritional Supplements (FEEDING SUPPLEMENT, JEVITY 1.2 CAL,) LIQD  Place 360 mLs into feeding tube 4 (four) times daily.  5000 mL  0   .  omeprazole (PRILOSEC) 20 MG capsule  Take 1 capsule (20 mg total) by  mouth 2 (two) times daily.  180 capsule  2   .  ondansetron (ZOFRAN ODT) 4 MG disintegrating tablet  Take 1 tablet (4 mg total) by mouth every 8 (eight) hours as needed for nausea.  20 tablet  0   .  oxyCODONE-acetaminophen (PERCOCET/ROXICET)  5-325 MG per tablet  Take 1-2 tablets by mouth every 4 (four) hours as needed for pain.  20 tablet  0   .  potassium chloride (K-DUR) 10 MEQ tablet  Take 1 tablet (10 mEq total) by mouth daily.  30 tablet  0   .  saccharomyces boulardii (FLORASTOR) 250 MG capsule  Take 1 capsule (250 mg total) by mouth 2 (two) times daily.  60 capsule  1   .  sodium chloride 0.9 % injection  10-40 mLs by Intracatheter route as needed (flush).  5 mL  3   .  warfarin (COUMADIN) 4 MG tablet  Take 1 tablet (4 mg total) by mouth one time only at 6 PM.  30 tablet  0   .  Water For Irrigation, Sterile (FREE WATER) SOLN  Place 160 mLs into feeding tube 4 (four) times daily - after meals and at bedtime.      Home:  Home Living  Lives With: Spouse;Family  Available Help at Discharge: Family;Available 24 hours/day  Type of Home: House  Home Access: Stairs to enter  [employer] of Steps: plans to get ramp  Entrance Stairs-Rails: None  Home Layout: One level  Bathroom Shower/Tub: Sports administrator: Standard  Bathroom Accessibility: Yes  How Accessible: Accessible via walker  Home Adaptive Equipment: Wheelchair - manual;Bedside commode/3-in-1;Walker - four wheeled;Shower chair with back  Additional Comments: [facility] follows pt  Functional History:  Prior Function  Bath: Minimal  Toileting: Moderate  Dressing: Maximal  Meal Prep: Total  Light Housekeeping: Total  Able to Take Stairs?: No  Driving: No  Vocation: Other (comment) ([employer]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)  Comments: [employer] her insurance  Functional Status:  Mobility:  Bed Mobility  Bed Mobility: Supine to Sit  Supine to Sit: 3: Mod assist;HOB elevated;With rails  Supine to Sit: Patient Percentage: 40%  Sitting - Scoot to Edge of Bed: 3: Mod assist  Transfers  Transfers: Sit to Stand;Stand to Sit  Sit to Stand: 3: Mod assist;With upper extremity assist;From bed  Sit to Stand: Patient Percentage: 50%   Stand to Sit: 4: Min assist;With upper extremity assist;To chair/3-in-1  Stand to Sit: Patient Percentage: 70%  Stand Pivot Transfers: Patient Percentage: 70%  Ambulation/Gait  Ambulation/Gait Assistance: 4: Min guard  Ambulation Distance (Feet): 100 Feet (60' + 38' with 2 minute seated rest break)  Assistive device: Rolling walker  Ambulation/Gait Assistance Details: min/guard assist for safety/balance. HR 94 with walking, SaO2 97% on RA. 2/4 dyspnea with walking.  Gait Pattern: Step-through pattern;Decreased stride length;Narrow base of support  Gait velocity: decreased  General Gait Details: increased gait distance today, overall distance limited by fatigue   ADL:  ADL  Eating/Feeding: Set up  Where Assessed - Eating/Feeding: Chair  Grooming: Wash/dry face;Wash/dry hands;Teeth care;Simulated;Set up  Where Assessed - Grooming: Unsupported sitting  Upper Body Bathing: Simulated;Minimal assistance  Where Assessed - Upper Body Bathing: Supported sitting  Lower Body Bathing: Simulated;Maximal assistance  Where Assessed - Lower Body Bathing: Supported sit to stand  Upper Body Dressing: Performed;Moderate assistance  Where Assessed - Upper Body Dressing: Supported sitting  Lower Body Dressing: Performed;Maximal assistance  Where Assessed -  Lower Body Dressing: Supported sit to Scientist, research (life sciences): Simulated;Moderate assistance  Toilet Transfer Method: Stand pivot  Toilet Transfer Equipment: Bedside commode  Equipment Used: Rolling walker  Transfers/Ambulation Related to ADLs: Husband very supportive. Pt had had a BM and was not aware. Clean up was significant. Explained to husband wearing a brief may help at home if this is something that happens often  ADL Comments: Encouraged pt to perform UE raises X10 a few sets per day to increase strength and activity tolerance. Encouaraged pt to not sit in recliner more than 1-2 hours for pressure tolerance. Donut cushion in chair. Pt motivated  to be able to do as much as she can for herself.  Cognition:  Cognition  Overall Cognitive Status: Within Functional Limits for tasks assessed  Arousal/Alertness: Awake/alert  Orientation Level: Oriented X4  Cognition  Arousal/Alertness: Awake/alert  Behavior During Therapy: WFL for tasks assessed/performed  Overall Cognitive Status: Within Functional Limits for tasks assessed  Area of Impairment: Memory  Memory: Decreased short-term memory  General Comments: Pt seemed to have some minor memory deficits during evaluation in recalling her age and her grandchildren's ages. This is not normal for her.  Physical Exam:  Blood pressure 120/67, pulse 6, temperature 98.6 F (37 C), temperature source Oral, resp. rate 16, height 5\' 4"  (1.626 m), weight 97.977 kg (216 lb), SpO2 97.00%.  Physical Exam  Nursing note and vitals reviewed.  Constitutional: She is oriented to person, place, and time. She appears well-developed. Appears fatigued. obese HENT:  Head: Normocephalic and atraumatic.  Eyes: Pupils are equal, round, and reactive to light.  Neck: Normal range of motion.  Cardiovascular: Normal rate and regular rhythm. No murmurs Pulmonary/Chest: Effort normal and breath sounds normal. Bilateral mastectomy scars Abdominal: Soft. Bowel sounds are normal. She exhibits no distension. There is no tenderness.  PEG in place  Musculoskeletal: She exhibits edema (BLE with 1+ edema.).  Foam dressing bilateral feet.  Neurological: She is alert and oriented to person, place, and time.  Flat affect. Cognitively with basic insight and awareness. Some STM deficits. Motor: 4/5 in bilateral deltoid, biceps, triceps3 minus/5 in bilateral hip flexor knee extensor ankle dorsiflexor and plantar flexor   Sensory to light touch is intact in the upper and lower limbs  Speech is clear. No language deficits   Results for orders placed during the hospital encounter of 09/07/12 (from the past 48 hour(s))   GLUCOSE,  CAPILLARY Status: Abnormal    Collection Time    09/11/12 12:11 PM   Result  Value  Range    Glucose-Capillary  108 (*)  70 - 99 mg/dL   GLUCOSE, CAPILLARY Status: Abnormal    Collection Time    09/11/12 4:47 PM   Result  Value  Range    Glucose-Capillary  101 (*)  70 - 99 mg/dL    Comment 1  Notify RN    GLUCOSE, CAPILLARY Status: Abnormal    Collection Time    09/11/12 9:49 PM   Result  Value  Range    Glucose-Capillary  100 (*)  70 - 99 mg/dL    Comment 1  Notify RN    PROTIME-INR Status: Abnormal    Collection Time    09/12/12 5:42 AM   Result  Value  Range    Prothrombin Time  23.6 (*)  11.6 - 15.2 seconds    INR  2.21 (*)  0.00 - 1.49   GLUCOSE, CAPILLARY Status: None    Collection Time  09/12/12 7:54 AM   Result  Value  Range    Glucose-Capillary  78  70 - 99 mg/dL    Comment 1  Notify RN    GLUCOSE, CAPILLARY Status: Abnormal    Collection Time    09/12/12 11:39 AM   Result  Value  Range    Glucose-Capillary  121 (*)  70 - 99 mg/dL    Comment 1  Notify RN    GLUCOSE, CAPILLARY Status: None    Collection Time    09/12/12 5:04 PM   Result  Value  Range    Glucose-Capillary  92  70 - 99 mg/dL    Comment 1  Notify RN    GLUCOSE, CAPILLARY Status: Abnormal    Collection Time    09/12/12 9:11 PM   Result  Value  Range    Glucose-Capillary  105 (*)  70 - 99 mg/dL    Comment 1  Notify RN    PROTIME-INR Status: Abnormal    Collection Time    09/13/12 6:45 AM   Result  Value  Range    Prothrombin Time  27.9 (*)  11.6 - 15.2 seconds    INR  2.77 (*)  0.00 - 1.49   GLUCOSE, CAPILLARY Status: None    Collection Time    09/13/12 8:07 AM   Result  Value  Range    Glucose-Capillary  82  70 - 99 mg/dL    No results found.  Post Admission Physician Evaluation:  1. Functional deficits secondary to severe deconditioning/ FTT related to breast cancer/ treatment SE/ depression. 2. Patient is admitted to receive collaborative, interdisciplinary care between the  physiatrist, rehab nursing staff, and therapy team. 3. Patient's level of medical complexity and substantial therapy needs in context of that medical necessity cannot be provided at a lesser intensity of care such as a SNF. 4. Patient has experienced substantial functional loss from his/her baseline which was documented above under the "Functional History" and "Functional Status" headings. Judging by the patient's diagnosis, physical exam, and functional history, the patient has potential for functional progress which will result in measurable gains while on inpatient rehab. These gains will be of substantial and practical use upon discharge in facilitating mobility and self-care at the household level. 5. Physiatrist will provide 24 hour management of medical needs as well as oversight of the therapy plan/treatment and provide guidance as appropriate regarding the interaction of the two. 6. 24 hour rehab nursing will assist with bladder management, bowel management, safety, skin/wound care, disease management, medication administration, pain management and patient education and help integrate therapy concepts, techniques,education, etc. 7. PT will assess and treat for/with: Lower extremity strength, range of motion, stamina, balance, functional mobility, safety, adaptive techniques and equipment, pain mgt. Goals are: mod I to supervision. 8. OT will assess and treat for/with: ADL's, functional mobility, safety, upper extremity strength, adaptive techniques and equipment, education. Goals are: mod I to supervision. 9. SLP will assess and treat for/with: n/a. Goals are: n/a. 10. Case Management and Social Worker will assess and treat for psychological issues and discharge planning. 11. Team conference will be held weekly to assess progress toward goals and to determine barriers to discharge. 12. Patient will receive at least 3 hours of therapy per day at least 5 days per week. 13. ELOS: 7-10 days  Prognosis: good Medical Problem List and Plan:  1. DVT Prophylaxis/Anticoagulation: On coumadin for atrial fibrillation.  2. Pain Management: Morphine prn  3. Depression: Team to  provide ego support. To continue Wellbutrin, Lexapro and Zyprexa. Will have LCSW follow for support. Will consult neuropsychiatry and chaplain additionally while in CIR.  4. Neuropsych: This patient is capable of making decisions on her own behalf.  5. HTN: Monitor on bid basis. Will check daily weights to monitor for fluid overload issues. Resume Lasix and/or Cardizem as indicated.  6. OSA: continue CPAP at nights.  7. Cirrhosis of liver-autoimmune?:  8. Atrial Fibrillation with RVR: Will continue to monitor HR on bid basis. Has been off Cardizem. Continue metoprolol. Will taper amiodarone to 200mg  daily per prior cardiology note. May help with GI symptoms.  9.Anemia: Recheck in am. Has had steady drop in the past month. H/o heme positive stool. Will order stool guaiacs and check serial H/H.  10. DM type 2-diet controlled: Regular diet for now to help with food choices and appetite. Continue TF 6 X day and monitor intake. Will have RD adjusting rate/ timing to help with po's. Continue CBG checks ac/hs with SSI to help with elevated BS.  11. Breast cancer: On Femara. For XRT once stronger. Patient to follow up with Dr. Michell Heinrich aftert discharge.   Ranelle Oyster, MD, [place] [facility]  09/13/2012

## 2012-09-13 NOTE — Progress Notes (Signed)
RT Note: SPoke with pt about CPAP order she has brought her unit from home and plans to wear it while she is here. She states she does not need any assistance and I told her if she has any problems with it and wants one of our machines to let her [name] know. RT will continue to monitor,

## 2012-09-13 NOTE — Telephone Encounter (Signed)
See letter.

## 2012-09-13 NOTE — PMR Pre-admission (Signed)
PMR Admission Coordinator Pre-Admission Assessment  Patient: [name] is an 57 y.o., female MRN: [id number] DOB: 1955/12/26 Height: 5\' 4"  (162.6 cm) Weight: 97.977 kg (216 lb)              Insurance Information HMO:     PPO: yes     PCP:      IPA:      80/20:      OTHER:  PRIMARYPhineas Real      Policy#: [id number]      Subscriber: pt CM Name: Jill Side      Phone#: [phone]     Fax#: [phone] Pre-Cert#: [id number]      Employer: [employer] ..Need update on Friday 09/17/12.  Benefits:  Phone #: [phone]     Name: 5/5 Erin Eff. Date: 05/12/12 active     [facility]ed[facility]: [facility] $750 met      Out of Pocket Max: [facility] $4000 met [phone]      Life Max: unlimited CIR: 80% [facility]      [facility]: [facility] 120 days max [facility]patient: $20 copay at [facility] facility     Co-Pay: no visit limit for [facility] Home Health: 80%      Co-Pay: no visit limit DME: 80%     Co-Pay:  > $500 must precert for own and rental Providers: in network  SECONDARY: none      Medicaid Application Date:       Case Manager:  Disability Application Date:       Case Worker:   Emergency Conservator, museum/gallery Information   Name Relation Home Work Mobile   Shryock,[name] Spouse  [phone]     Current Medical History  Patient Admitting Diagnosis:Deconditioning related to breast carcinoma and malnutrition  History of Present Illness: [name] is a 57 y.o. female with history of breast cancer, Atrial fibrillation, malnutrition requiring PEG 4/18, multiple recent admissions who was discharged to home 09/06/12. Family reported difficulty getting patient out of the car and into the home due to inability walk and BLE weakness. She was readmitted on 04/29 with severely depressed mood, complaints of weakness and refusal of continuing any further interventions. Palliative care and psychiatary consulted for assistance and patient was agreeable to start medications  to help with mood stabilization as well as continued medical interventions.   Medical Plan of care per Dr. [name]: 1) status post right mastectomy under the care of Dr. 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 Lymphovascular perineural invasion was identified. 2 of 18 lymph nodes were involved. Margins were clear. Pathologic staging pT3, pN1a, stage IIIA.  (2) treated in the adjuvant setting with docetaxel/ cyclophosphamide given every 3 weeks, with Neulasta on day 2 for granulocyte support. First treatment was given on 07/06/2012. Due to intolerance, we decreased her doses by 15% beginning cycle 2, given 07/29/2012. However, tolerance remained poor and no further chemotherapy is planned  (3) patient will need postmastectomy radiation--Dr Michell Heinrich to evaluate once patient Rehab'd  (4) letrozole started April 2014, plan is to continue for 5 years;  (5) comorbidities  (a) diabetes-- currently on SSI only as caloric intake poor; continue to monitor  (b) hypertension--controlled  (c) A fib--anticoagulated; currently RRR  (d) chronic liver disease--aware  e) c diff negative diarrhea--resolved; questran and imodium now PRN  (f) malnutrition:  s/p PEG placement; tolerating 6 feeds/day w/o diarrhea; can be transtioned so all nutrition/ hydration is by PEG (so IVF could be stopped)  (g) end of life issues: DNR in place, but patient has no terminal disease, her problems being either curable or chronic  (h) pain: largely resolved; have changed morphine to PRN    Past Medical History  Past Medical History  Diagnosis Date  . Depression   . DVT (deep venous thrombosis)     hx of on HRT left leg ~2006  . GERD (gastroesophageal reflux disease)   . Hyperlipidemia   . Hypertension   . Hypothyroidism   . PPD positive, treated     rx inh   . OSA on CPAP   .  Diabetes mellitus   . 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 Breast cancer     a. 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 DJD (degenerative joint disease) of lumbar spine     Family History  family history includes Arthritis in her mother; Colon cancer in her paternal grandfather; Diabetes in her mother and paternal grandmother; Heart disease in her father and mother; Heart failure in her mother; Hypertension in her mother; Other in her mother; and Stroke in her father.  Prior Rehab/Hospitalizations: Was at [facility] for 3 days last month.  Current Medications  Current facility-administered medications:0.9 %  sodium chloride infusion, 250 mL, Intravenous, PRN, Lowella Dell, MD, Last Rate: 75 mL/hr at 09/09/12 0609, 250 mL at 09/09/12 0609;  alum & mag hydroxide-simeth (MAALOX/MYLANTA) 200-200-20 MG/5ML suspension 15 mL, 15 mL, Oral, Q4H PRN, Lowella Dell, MD, 15 mL at 09/10/12 2154;  amiodarone (PACERONE) tablet 400 mg, 400 mg, Oral, BID, Lowella Dell, MD, 400 mg at 09/13/12 0916 antiseptic oral rinse (BIOTENE) solution 15 mL, 15 mL, Mouth Rinse, BID, Lennis P Darrold Span, MD, 15 mL at 09/12/12 2127;  aspirin EC tablet 81 mg, 81 mg, Oral, q morning - 10a, Meredeth Ide, MD, 81 mg at 09/13/12 1610;  buPROPion [facility] SR) 12 hr tablet 150 mg, 150 mg, Oral, q morning - 10a, Meredeth Ide, MD, 150 mg at 09/13/12 9604;  cholestyramine (QUESTRAN) packet 4 g, 4 g, Oral, BID PRN, Lowella Dell, MD escitalopram (LEXAPRO) tablet 20 mg, 20 mg, Oral, QHS, Nehemiah Settle, MD, 20 mg at 09/12/12 2124;  feeding supplement (JEVITY 1.2 CAL) liquid 237 mL, 237 mL, Per Tube, 6 X Daily, Lavena Bullion, RD;  feeding supplement (PRO-STAT SUGAR FREE 64) liquid 30 mL, 30 mL, Per Tube, Q1200, Lavena Bullion, RD, 30 mL at 09/12/12 1750;  free water 60 mL, 60 mL, Per  Tube, 6 X Daily, Lavena Bullion, RD, 60 mL at 09/13/12 0904 insulin aspart (novoLOG) injection 0-9 Units, 0-9 Units, Subcutaneous, TID WC, Meredeth Ide, MD;  letrozole [facility]) tablet 2.5 mg, 2.5 mg, Oral, Daily, Meredeth Ide, MD, 2.5 mg at 09/13/12 0917;  levothyroxine (SYNTHROID, LEVOTHROID) tablet 125 mcg, 125 mcg, Oral, QAC breakfast, Meredeth Ide, MD, 125 mcg at 09/13/12 0818;  loperamide (IMODIUM) capsule 2 mg, 2 mg, Oral, QID PRN, Lowella Dell, MD, 2 mg at 09/10/12 1248 metoprolol tartrate (LOPRESSOR) tablet 75 mg, 75 mg, Oral, BID, Meredeth Ide, MD, 75 mg at 09/13/12 0916;  morphine 2 MG/ML injection 1 mg, 1 mg, Intravenous, Q4H PRN, Lowella Dell,  MD;  multivitamin with minerals tablet 1 tablet, 1 tablet, Oral, Daily, Lavena Bullion, RD, 1 tablet at 09/13/12 0916;  OLANZapine (ZYPREXA) tablet 2.5 mg, 2.5 mg, Oral, QHS, Nehemiah Settle, MD, 2.5 mg at 09/12/12 2124 ondansetron (ZOFRAN) 8 mg/NS 50 ml IVPB, 8 mg, Intravenous, TID PRN, Lowella Dell, MD, 8 mg at 09/10/12 1248;  promethazine (PHENERGAN) injection 12.5 mg, 12.5 mg, Intravenous, QID, Lowella Dell, MD, 12.5 mg at 09/12/12 0981;  sodium chloride 0.9 % injection 10-40 mL, 10-40 mL, Intracatheter, Q12H, Meredeth Ide, MD, 10 mL at 09/12/12 0941 sodium chloride 0.9 % injection 10-40 mL, 10-40 mL, Intracatheter, PRN, Meredeth Ide, MD, 10 mL at 09/08/12 0514;  sodium chloride 0.9 % injection 3 mL, 3 mL, Intravenous, Q12H, Meredeth Ide, MD, 3 mL at 09/12/12 0943;  sodium chloride 0.9 % injection 3 mL, 3 mL, Intravenous, PRN, Meredeth Ide, MD;  Warfarin - Pharmacist Dosing Inpatient, , Does not apply, q1800, Loma Messing [place]ger[place], RPH  Patients Current Diet: General but with poor appetite. Peg Feeds with 6 cans per day per bolus feeds  Precautions / Restrictions Precautions Precautions: Fall Precaution Comments: Monitor HR Restrictions Weight Bearing Restrictions: No   Prior Activity Level Household: limited  since february with alot of readmits for Cancer treatment  Home Assistive Devices / Equipment Home Assistive Devices/Equipment: Feeding equipment;Enteral Feeding Supplies;Shower chair with back;Bedside commode/3-in-1;CPAP;Other (Comment);CBG Meter (ROLLING WALKER, FOLEY CATHETER) Home Adaptive Equipment: Wheelchair - manual;Bedside commode/3-in-1;Walker - four wheeled;Shower chair with back  Prior Functional Level Prior Function Level of Independence: Needs assistance Needs Assistance: Bathing;Dressing;Toileting;Meal Prep;Light Housekeeping Bath: Minimal Dressing: Maximal Toileting: Moderate Meal Prep: Total Light Housekeeping: Total Able to Take Stairs?: No Driving: No Vocation: 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  This is not normal for her.    Extremity Assessment (includes Sensation/Coordination)  RUE ROM/Strength/Tone: Deficits RUE ROM/Strength/Tone Deficits: strength grossly 4/5 RUE Sensation: WFL - Light Touch RUE Coordination: WFL - gross/fine motor  RLE ROM/Strength/Tone Deficits: hip flex, knee flex/ext and ankle DF grossly 3/5    ADLs  Eating/Feeding: Set up Where Assessed - Eating/Feeding: Chair Grooming: Wash/dry face;Wash/dry hands;Teeth care;Simulated;Set up Where Assessed - Grooming: Unsupported sitting Upper Body Bathing: Simulated;Minimal assistance Where Assessed - Upper Body Bathing: Supported sitting Lower Body Bathing: Simulated;Maximal assistance Where Assessed -  Lower Body Bathing: Supported sit to stand Upper Body Dressing: Performed;Moderate assistance Where Assessed - Upper Body Dressing: Supported sitting Lower Body Dressing: Performed;Maximal assistance Where Assessed - Lower Body Dressing: Supported sit to Pharmacist, hospital: Simulated;Moderate assistance Toilet Transfer Method: Stand pivot Toilet Transfer Equipment: Bedside commode Toileting - Clothing Manipulation and Hygiene: Simulated;Minimal assistance (to adjust gown before sitting) Where Assessed - Toileting Clothing Manipulation and Hygiene: Standing Equipment Used: Rolling walker Transfers/Ambulation Related to ADLs: Husband very supportive. Pt had had a BM and was not aware.  Clean up was significant.  Explained to husband wearing a brief may help at home if this is something that happens often ADL Comments: Encouraged pt to perform UE raises X10 a few sets per day to increase strength and activity tolerance. Encouaraged pt  to not sit in recliner more than 1-2 hours for pressure tolerance. Donut cushion in chair. Pt motivated to be able to do as much as she can for herself.     Mobility  Bed Mobility: Supine to Sit Supine to Sit: 3: Mod assist;HOB elevated;With rails Supine to Sit: Patient Percentage: 40% Sitting - Scoot to Edge of Bed: 3: Mod assist    Transfers  Transfers: Sit to Stand;Stand to Sit Sit to Stand: 3: Mod assist;With upper extremity assist;From bed Sit to Stand: Patient Percentage: 50% Stand to Sit: 4: Min assist;With upper extremity assist;To chair/3-in-1 Stand to Sit: Patient Percentage: 70% Stand Pivot Transfers: Patient Percentage: 70%    Ambulation / Gait / Stairs / Wheelchair Mobility  Ambulation/Gait Ambulation/Gait Assistance: 4: Min guard Ambulation Distance (Feet): 100 Feet (60' + 72' with 2 minute seated rest break) Assistive device: Rolling walker Ambulation/Gait Assistance Details: min/guard assist for safety/balance. HR 94 with walking, SaO2  97% on RA. 2/4 dyspnea with walking. Gait Pattern: Step-through pattern;Decreased stride length;Narrow base of support Gait velocity: decreased General Gait Details: increased gait distance today, overall distance limited by fatigue    Posture / Balance Static Sitting Balance Static Sitting - Balance Support: Bilateral upper extremity supported;Feet supported Static Sitting - Level of Assistance: 4: Min assist;3: Mod assist;5: Stand by assistance Static Sitting - Comment/# of Minutes: posterior LOB Static Standing Balance Static Standing - Balance Support: Bilateral upper extremity supported Static Standing - Level of Assistance: 5: Stand by assistance Static Standing - Comment/# of Minutes: 4    Special needs/care consideration Bowel mgmt: continent without diarrhea at present Bladder mgmt:foley catheter Palliative care team and psych have been following pt this admission due to depression and goals of care planning. I spoke with Dr. [name] on 09/10/12 and he states prognosis good. Plans for medical care are outlined.He gives no prognosis less than years. He states she is cured with a lot of complications from her treatments.   Previous Home Environment Living Arrangements: Spouse/significant other;Other relatives (dtr and son in law and 2 grand children) Lives With: Spouse;Family Available Help at Discharge: Family;Available 24 hours/day Type of Home: House Home Layout: One level Home Access: Stairs to enter Entrance Stairs-Rails: None Entrance Stairs-Number of Steps: plans to get ramp Bathroom Shower/Tub: Tub/shower unit;Curtain Firefighter: Standard Bathroom Accessibility: Yes How Accessible: Accessible via walker Home Care Services: Yes Type of Home Care Services: Home RN Home Care Agency (if known): [facility] Additional Comments: [facility] follows pt  Discharge Living Setting Plans for Discharge Living Setting: Patient's home;Lives with (comment) (spouse and  family) Type of Home at Discharge: House Discharge Home Layout: One level Discharge Home Access: Stairs to enter Entrance Stairs-Rails: None Discharge Bathroom Shower/Tub: Tub/shower unit Discharge Bathroom Toilet: Standard Discharge Bathroom Accessibility: Yes How Accessible: Accessible via walker Do you have any problems obtaining your medications?: No  Social/Family/Support Systems Patient Roles: Spouse;Parent;Other (Comment) (employee for [employer]) Contact Information: Mare Ferrari, see above Anticipated Caregiver: Spouse , Daughter and son in law Anticipated Caregiver's Contact Information: see above for spouse Ability/Limitations of Caregiver: he works nights and daughter and her husband work opposite shifts Caregiver Availability: 24/7 Discharge Plan Discussed with Primary Caregiver: Yes Is Caregiver In Agreement with Plan?: Yes Does Caregiver/Family have Issues with Lodging/Transportation while Pt is in Rehab?: No    Goals/Additional Needs Patient/Family Goal for Rehab: Mod I to supervision PT; supervision to min assist OT, n/a with SLP Expected length of stay: ELOS 7 to 10 days  Dietary Needs: PEG feeds due to failure to thrive and aversion to food with diarhea due to chemotherapy Pt/Family Agrees to Admission and willing to participate: Yes Program Orientation Provided & Reviewed with Pt/Caregiver Including Roles  & Responsibilities: Yes   Decrease burden of Care through IP rehab admission: n/a  Possible need for SNF placement upon discharge:Family does not want SNF again. Very frustrating experience at [facility] care for 3 days. No meds and left in wheelchair for many hrs.  Patient Condition: This patient's condition remains as documented in the consult dated 09/09/12, in which the Rehabilitation Physician determined and documented that the patient's condition is appropriate for intensive rehabilitative care in an inpatient rehabilitation facility pending therapy  evaluations and tolerance for more intense therapy.. These areas have been addressed.  Patient able to sit up in chair for 2 to 5 hrs and tolerates well. More assistance needed at a Mod assist level to stand and then mod assist for short distances with RW. No diarrhea noted and tolerating bolus feeds for 6 cans tube feeds per PEG per day.  Will admit to inpatient rehab today.  Preadmission Screen Completed By:  Clois Dupes, 09/13/2012 10:11 AM ______________________________________________________________________   Discussed status with Dr. Riley Kill on 09/13/12 at  1011 and received telephone approval for admission today.  Admission Coordinator:  Clois Dupes, time 1011 Date 09/13/12.

## 2012-09-13 NOTE — Progress Notes (Signed)
I have insurance approval and plan to admit pt to inpt rehab . Patient and her spouse are aware and in agreement. I will notify Dr. [name] and RN CM and SW.  [phone]

## 2012-09-13 NOTE — Progress Notes (Signed)
Palliative Medicine Team SW Farewell visit with pt and husband before transfer to CIR. Pt in bright spirits, hopeful for continued progress and to ultimately be able to walk and drive again. Affirmed pt's will to improve, encouraged husband for continued support. [facility] SW for provision of meditation cd for pt to enjoy before bed. Pt and family very appreciative for support, no further needs.   Kennieth Francois, LCSWA PMT phone [phone] Pager [phone]

## 2012-09-14 ENCOUNTER — Inpatient Hospital Stay (HOSPITAL_COMMUNITY): Payer: 59 | Admitting: Occupational Therapy

## 2012-09-14 ENCOUNTER — Telehealth: Payer: Self-pay | Admitting: Dietician

## 2012-09-14 ENCOUNTER — Inpatient Hospital Stay (HOSPITAL_COMMUNITY): Payer: 59

## 2012-09-14 ENCOUNTER — Encounter (HOSPITAL_COMMUNITY): Payer: Self-pay

## 2012-09-14 ENCOUNTER — Inpatient Hospital Stay (HOSPITAL_COMMUNITY): Payer: 59 | Admitting: Physical Therapy

## 2012-09-14 DIAGNOSIS — F341 Dysthymic disorder: Secondary | ICD-10-CM

## 2012-09-14 DIAGNOSIS — R5381 Other malaise: Secondary | ICD-10-CM

## 2012-09-14 DIAGNOSIS — E669 Obesity, unspecified: Secondary | ICD-10-CM

## 2012-09-14 DIAGNOSIS — C50919 Malignant neoplasm of unspecified site of unspecified female breast: Secondary | ICD-10-CM

## 2012-09-14 LAB — CBC WITH DIFFERENTIAL/PLATELET
Eosinophils Relative: 2 % (ref 0–5)
HCT: 26.6 % — ABNORMAL LOW (ref 36.0–46.0)
Lymphs Abs: 1 10*3/uL (ref 0.7–4.0)
MCH: 27.9 pg (ref 26.0–34.0)
MCV: 85.3 fL (ref 78.0–100.0)
Monocytes Absolute: 0.6 10*3/uL (ref 0.1–1.0)
Neutro Abs: 7.2 10*3/uL (ref 1.7–7.7)
Platelets: 197 10*3/uL (ref 150–400)
RBC: 3.12 MIL/uL — ABNORMAL LOW (ref 3.87–5.11)
RDW: 26.2 % — ABNORMAL HIGH (ref 11.5–15.5)

## 2012-09-14 LAB — COMPREHENSIVE METABOLIC PANEL
Albumin: 1.5 g/dL — ABNORMAL LOW (ref 3.5–5.2)
Alkaline Phosphatase: 192 U/L — ABNORMAL HIGH (ref 39–117)
BUN: 14 mg/dL (ref 6–23)
Chloride: 106 mEq/L (ref 96–112)
Creatinine, Ser: 0.51 mg/dL (ref 0.50–1.10)
GFR calc Af Amer: >90 mL/min (ref >90)
GFR calc non Af Amer: >90 mL/min (ref >90)
Glucose, Bld: 105 mg/dL — ABNORMAL HIGH (ref 70–99)
Potassium: 4.1 mEq/L (ref 3.5–5.1)
Total Bilirubin: 0.4 mg/dL (ref 0.3–1.2)

## 2012-09-14 LAB — GLUCOSE, CAPILLARY
Glucose-Capillary: 123 mg/dL — ABNORMAL HIGH (ref 70–99)
Glucose-Capillary: 127 mg/dL — ABNORMAL HIGH (ref 70–99)

## 2012-09-14 LAB — PROTIME-INR: Prothrombin Time: 29.8 seconds — ABNORMAL HIGH (ref 11.6–15.2)

## 2012-09-14 MED ORDER — FREE WATER
120.0000 mL | Status: DC
  Administered 2012-09-14 – 2012-09-16 (×9): 120 mL

## 2012-09-14 MED ORDER — SALINE SPRAY 0.65 % NA SOLN
1.0000 | NASAL | Status: DC | PRN
Start: 2012-09-14 — End: 2012-09-28
  Administered 2012-09-18: 1 via NASAL
  Filled 2012-09-14: qty 44

## 2012-09-14 MED ORDER — WARFARIN SODIUM 2 MG PO TABS
2.0000 mg | ORAL_TABLET | Freq: Once | ORAL | Status: AC
  Administered 2012-09-14: 2 mg via ORAL
  Filled 2012-09-14: qty 1

## 2012-09-14 MED ORDER — LOPERAMIDE HCL 2 MG PO CAPS
2.0000 mg | ORAL_CAPSULE | Freq: Three times a day (TID) | ORAL | Status: DC
  Administered 2012-09-14 – 2012-09-17 (×12): 2 mg via ORAL
  Filled 2012-09-14 (×17): qty 1

## 2012-09-14 MED ORDER — JEVITY 1.2 CAL PO LIQD
360.0000 mL | ORAL | Status: DC
  Administered 2012-09-14 (×2): 360 mL
  Administered 2012-09-14: 17:00:00
  Administered 2012-09-15 – 2012-09-16 (×6): 360 mL
  Filled 2012-09-14 (×14): qty 474

## 2012-09-14 NOTE — Progress Notes (Addendum)
INITIAL NUTRITION ASSESSMENT  DOCUMENTATION CODES Per approved criteria  -Severe malnutrition in the context of chronic illness   INTERVENTION: 1. Change bolus schedule to better meet patient's preferences. Provide 360 ml Jevity 1.2 QID. Add 60 ml free water flush before and after each feeding. This regimen will provide: 1728 kcal, 81 grams protein, 1642 ml free water. 2. RD reviewed menu with patient and family, encouraged her to order from Additional Selections portion of menu if needed. 3. Discontinue 30 ml Prostat 4. RD to continue to follow nutrition care plan  NUTRITION DIAGNOSIS: Inadequate oral intake related to poor appetite as evidenced by pt with PEG and TF as main source of nutrition.   Goal: Intake to meet at least 90% of estimated needs.  Monitor:  weight trends, lab trends, I/O's, PO intake, TF tolerance  Reason for Assessment: MD Consult for TF Initiation/Management  57 y.o. female  Admitting Dx: Physical deconditioning  ASSESSMENT: Pt with hx of metastatic breast CA s/p right mastectomy, DM, HTN with progressively worsening weakness since diagnosis and treatment of CA in January 2014. Admitted to rehab with deconditioning d/t multiple medical issues. Pt with history of severe d/v/n, very limited PO intake. Pt with no desire to eat x 2 months. Pt had PEG placed during previous admission and pt was started on continuous infusion of Jevity 1.2 then switched to bolus TF. Pt was getting of Jevity 1.2 via PEG 4 times/day PTA which provides 1728 calories, 80g protein, free water. Pt reports she was tolerating this TF well at home without n/v.  RD consulted for adjustment of bolus regimen. Current TF regimen is 237 ml Jevity 1.2 six times daily + 30 ml Prostat + 100 ml free water TID. This regimen provides: 1810 kcal, 94 grams protein, 1446 ml free water.  Pt states that she would like to resume her QID bolus regimen - RD to order. She notes that her poor oral  intake is ongoing and is mainly 2/2 taste. She is eating a few bites off of her meal trays and then gives the rest to her family. Encouraged her to allow family to bring food if she wanted.  Pt meets criteria for SEVERE MALNUTRITION in the context of chronic as evidenced by <75% of estimated energy requirements for 2 months and edema.  Height: Ht Readings from Last 1 Encounters:  09/08/12 5\' 4"  (1.626 m)    Weight: Wt Readings from Last 1 Encounters:  09/14/12 217 lb 2.5 oz (98.5 kg)    Ideal Body Weight: 120 lb  % Ideal Body Weight: 181%  Wt Readings from Last 10 Encounters:  09/14/12 217 lb 2.5 oz (98.5 kg)  09/08/12 216 lb (97.977 kg)  09/06/12 215 lb 2.7 oz (97.6 kg)  08/17/12 220 lb 4.8 oz (99.927 kg)  07/29/12 185 lb 11.2 oz (84.233 kg)  07/29/12 185 lb 12.8 oz (84.278 kg)  07/23/12 207 lb 1.3 oz (93.931 kg)  07/21/12 195 lb 8.8 oz (88.7 kg)  07/05/12 201 lb 1.9 oz (91.227 kg)  07/01/12 196 lb 6.4 oz (89.086 kg)    Usual Body Weight: 198 lb  % Usual Body Weight: 110%  BMI:  Body mass index is 37.26 kg/(m^2). Obese Class II  Estimated Nutritional Needs: Kcal: 1650 - 1950  Protein: 85 - 100 grams Fluid: 1.8 - 2 liters daily  Skin: stage II on L and R buttocks  Diet Order: General  EDUCATION NEEDS: -No education needs identified at this time   Intake/Output  Summary (Last 24 hours) at 09/14/12 1043 Last data filed at 09/13/12 2227  Gross per 24 hour  Intake    337 ml  Output      0 ml  Net    337 ml    Last BM: 5/5  Labs:   Recent Labs Lab 09/08/12 0500 09/14/12 0500  NA 136 138  K 4.6 4.1  CL 101 106  CO2 29 23  BUN 27* 14  CREATININE 0.75 0.51  CALCIUM 7.8* 7.2*  GLUCOSE 89 105*    CBG (last 3)   Recent Labs  09/13/12 1656 09/13/12 2059 09/14/12 0721  GLUCAP 113* 120* 105*    Scheduled Meds: . amiodarone  200 mg Oral BID   Followed by  . [START ON 09/16/2012] amiodarone  200 mg Oral Daily  . antiseptic oral rinse  15 mL  Mouth Rinse BID  . aspirin EC  81 mg Oral q morning - 10a  . buPROPion  150 mg Oral q morning - 10a  . escitalopram  20 mg Oral QHS  . feeding supplement (JEVITY 1.2 CAL)  237 mL Per Tube 6 X Daily  . feeding supplement  30 mL Per Tube Q1200  . free water  100 mL Per Tube Q8H  . insulin aspart  0-9 Units Subcutaneous TID WC  . letrozole  2.5 mg Oral Daily  . levothyroxine  125 mcg Oral QAC breakfast  . loperamide  2 mg Oral TID  . metoprolol tartrate  75 mg Oral BID  . multivitamin with minerals  1 tablet Oral Daily  . OLANZapine  2.5 mg Oral QHS  . warfarin  2 mg Oral ONCE-1800  . Warfarin - Pharmacist Dosing Inpatient   Does not apply q1800    Continuous Infusions:   Past Medical History  Diagnosis Date  . Depression   . DVT (deep venous thrombosis)     hx of on HRT left leg ~2006  . GERD (gastroesophageal reflux disease)   . Hyperlipidemia   . Hypertension   . Hypothyroidism   . PPD positive, treated     rx inh   . OSA on CPAP   . Diabetes mellitus   . 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 Breast cancer     a. 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 DJD (degenerative joint disease) of lumbar spine     Past Surgical History  Procedure Laterality Date  . Tubal ligation    . Cholecystectomy    . Foot surgery    . Eye surgery    . Cataract extraction    . Percutaneous liver biopsy    . Breast biopsy      left breast  . Mastectomy modified radical  06/08/2012    Procedure: MASTECTOMY MODIFIED RADICAL;  Surgeon: Mariella Saa, MD;  Location: [facility] OR;  Service: General;  Laterality: Right;  . Portacath placement  06/08/2012    Procedure: INSERTION PORT-A-CATH;  Surgeon: Mariella Saa, MD;  Location: [facility] OR;  Service: General;  Laterality: Left;  . Peg tube placement      Jarold Motto MS, RD, LDN Pager: [phone] After-hours pager:  [phone]

## 2012-09-14 NOTE — Telephone Encounter (Signed)
Letter faxed to Attn: Wyman Songster at [phone].

## 2012-09-14 NOTE — Progress Notes (Signed)
Physical Therapy Session Note  Patient Details  Name: [name] MRN: [id number] Date of Birth: 13-Oct-1955  Today's Date: 09/14/2012 Time: 1040-1105 Time Calculation (min): 25 min  Short Term Goals: Week 1:  PT Short Term Goal 1 (Week 1): Pt will be able to complete bed mobility with mod A using adaptive equipment if needed PT Short Term Goal 2 (Week 1): Pt will be able to complete sit to stands consistently with min A PT Short Term Goal 3 (Week 1): Pt will be able to gait x 50' with min A PT Short Term Goal 4 (Week 1): Pt will be able to go up/down 2 steps for home entry with mod A  Skilled Therapeutic Interventions/Progress Updates:  Session focused on sit <> stands and stand pivot transfers. PT demonstrated and educated on scooting to edge of chair, pulling feet under self, and anterior translation of trunk for improved mechanics to stand. Practiced from slightly elevated mat. Rocked multiple times into anterior translation "nose over toes" and pt able to reach min assist level. PT retrieved elevating leg rests to decrease dependent positioning of already edematous bil. LEs. Nursing made aware of pt's request for TED hose and medication to void self of excess fluids.   Therapy Documentation Precautions:  Precautions Precautions: Fall Precaution Comments: Monitor HR Restrictions Weight Bearing Restrictions: No Pain: Pain Assessment Pain Assessment: No/denies pain  See FIM for current functional status  Therapy/Group: Individual Therapy  Wilhemina Bonito 09/14/2012, 12:06 PM
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
## 2012-09-14 NOTE — Progress Notes (Signed)
[facility] Individual Statement of Services  Patient Name:  [name]  Date:  09/14/2012  Welcome to the [facility].  Our goal is to provide you with an individualized program based on your diagnosis and situation, designed to meet your specific needs.  With this comprehensive rehabilitation program, you will be expected to participate in at least 3 hours of rehabilitation therapies Monday-Friday, with modified therapy programming on the weekends.  Your rehabilitation program will include the following services:  Physical Therapy (PT), Occupational Therapy (OT), 24 hour per day rehabilitation nursing, Therapeutic Recreaction (TR), Psychology, Case Management (Social Worker), Rehabilitation Medicine, Nutrition Services and Pharmacy Services  Weekly team conferences will be held on Tuesdays to discuss your progress.  Your Social Worker will talk with you frequently to get your input and to update you on team discussions.  Team conferences with you and your family in attendance may also be held.  Expected length of stay: 2 weeks  Overall anticipated outcome: supervision to minimal assist  Depending on your progress and recovery, your program may change. Your Social Worker will coordinate services and will keep you informed of any changes. Your Social Worker's name and contact numbers are listed  below.  The following services may also be recommended but are not provided by the [facility]:   Driving Evaluations  Home Health Rehabiltiation Services  Outpatient Rehabilitatation [facility]  Vocational Rehabilitation   Arrangements will be made to provide these services after discharge if needed.  Arrangements include referral to agencies that provide these services.  Your insurance has been verified to be:  [facility] Your primary doctor is:  Sandford Craze, NP  Pertinent information will be shared with your doctor and your  insurance company.  Social Worker:  [place], [place] [phone] or [phone]  Information discussed with and copy given to patient by: Amada Jupiter, 09/14/2012, 10:19 AM

## 2012-09-14 NOTE — Progress Notes (Signed)
Chaplain Note: Found pt lying in bed resting.  Husband at bedside. Provided spiritual support and prayed with pt and her husband.  Will recommend follow up by Chaplain Ray tomorrow.  Rutherford Nail Chaplain Resident

## 2012-09-14 NOTE — Progress Notes (Signed)
ANTICOAGULATION CONSULT NOTE - Follow Up Consult  Pharmacy Consult for coumadin Indication: atrial fibrillation and history of DVT  Allergies  Allergen Reactions  . Olmesartan Medoxomil Cough    REACTION: ? if cough  . Tetracycline Hcl     Unknown reaction, too long for patient to remember   . Venlafaxine     REACTION: severe dry moouth  . Adhesive (Tape) Rash    Patient Measurements: Weight: 217 lb 2.5 oz (98.5 kg) Heparin Dosing Weight:   Vital Signs: Temp: 99.1 F (37.3 C) (05/06 0748) Temp src: Oral (05/06 0748) BP: 109/68 mmHg (05/06 0540) Pulse Rate: 73 (05/06 0540)  Labs:  Recent Labs  09/12/12 0542 09/13/12 0645 09/14/12 0500  HGB  --   --  8.7*  HCT  --   --  26.6*  PLT  --   --  197  LABPROT 23.6* 27.9* 29.8*  INR 2.21* 2.77* 3.03*  CREATININE  --   --  0.51    The CrCl is unknown because both a height and weight (above a minimum accepted value) are required for this calculation.   Medications:  Scheduled:  . amiodarone  200 mg Oral BID   Followed by  . [START ON 09/16/2012] amiodarone  200 mg Oral Daily  . antiseptic oral rinse  15 mL Mouth Rinse BID  . aspirin EC  81 mg Oral q morning - 10a  . buPROPion  150 mg Oral q morning - 10a  . escitalopram  20 mg Oral QHS  . feeding supplement (JEVITY 1.2 CAL)  237 mL Per Tube 6 X Daily  . feeding supplement  30 mL Per Tube Q1200  . free water  100 mL Per Tube Q8H  . insulin aspart  0-9 Units Subcutaneous TID WC  . letrozole  2.5 mg Oral Daily  . levothyroxine  125 mcg Oral QAC breakfast  . loperamide  2 mg Oral TID  . metoprolol tartrate  75 mg Oral BID  . multivitamin with minerals  1 tablet Oral Daily  . OLANZapine  2.5 mg Oral QHS  . [COMPLETED] warfarin  4 mg Oral ONCE-1800  . Warfarin - Pharmacist Dosing Inpatient   Does not apply q1800  . [DISCONTINUED] promethazine  12.5 mg Intravenous QID   Infusions:    Assessment: 57 yo female with afib and history of DVT is currently on  supratherapeutic coumadin>  INR today is 3.03. Goal of Therapy:  INR 2-3    Plan:  1) Coumadin 2mg  po x1 2) INR in am  Rorie Delmore, Tsz-Yin 09/14/2012,8:47 AM

## 2012-09-14 NOTE — Evaluation (Signed)
Occupational Therapy Assessment and Plan & Session Notes  Patient Details  Name: LAURIANNE FLORESCA MRN: [id number] Date of Birth: 06-01-1955  OT Diagnosis: abnormal posture, acute pain and muscle weakness (generalized) Rehab Potential: Rehab Potential: Excellent ELOS: ~2 weeks   Today's Date: 09/14/2012  ASSESSMENT AND PLAN  Problem List:  Patient Active Problem List   Diagnosis Date Noted  . Physical deconditioning 09/13/2012  . DJD (degenerative joint disease) of lumbar spine   . Malnutrition 09/07/2012  . CHF, acute 08/25/2012  . Cellulitis of leg, left 08/25/2012  . Anasarca 08/24/2012  . UTI (lower urinary tract infection) 08/24/2012  . Diarrhea 08/06/2012  . Hyperkalemia 08/06/2012  . Acute renal insufficiency 07/23/2012  . Edema 07/23/2012  . A-fib 07/14/2012  . Hypokalemia 07/13/2012  . Hyponatremia 07/13/2012  . Obesity, unspecified 07/13/2012  . Severe malnutrition in the context of acute illness 07/13/2012  . Neutropenic fever 07/12/2012  . Microcytic anemia 07/12/2012  . Thrombocytopenia 07/12/2012  . Cancer of breast 05/27/2012  . Dizziness 07/28/2011  . Congestion of nasal sinus 07/28/2011  . Allergic rhinitis, cause unspecified 07/28/2011  . Dyspnea on exertion 09/27/2010  . Liver disease, chronic, with cirrhosis   . Morbid obesity 08/03/2009  . IRON DEFICIENCY 06/08/2009  . DIABETES MELLITUS, TYPE II 10/16/2008  . 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 FATTY LIVER DISEASE 09/28/2008  . SPLENOMEGALY 09/28/2008  . Unspecified vitamin D deficiency 08/16/2008  . FATIGUE 08/16/2008  . 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 ARTHRITIS 03/30/2007  . HYPOTHYROIDISM 03/24/2007  . OBSTRUCTIVE SLEEP APNEA 03/24/2007  . HYPERGLYCEMIA 03/24/2007  . HYPERLIPIDEMIA 10/06/2006  . DEPRESSION 10/06/2006  . HYPERTENSION 10/06/2006  . GERD 10/06/2006  . LIVER FUNCTION TESTS, ABNORMAL 10/06/2006  . DVT, HX OF 10/06/2006    Past Medical History:  Past Medical  History  Diagnosis Date  . Depression   . DVT (deep venous thrombosis)     hx of on HRT left leg ~2006  . GERD (gastroesophageal reflux disease)   . Hyperlipidemia   . Hypertension   . Hypothyroidism   . PPD positive, treated     rx inh   . OSA on CPAP   . Diabetes mellitus   . 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 Breast cancer     a. 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 DJD (degenerative joint disease) of lumbar spine    Past Surgical History:  Past Surgical History  Procedure Laterality Date  . Tubal ligation    . Cholecystectomy    . Foot surgery    . Eye surgery    . Cataract extraction    . Percutaneous liver biopsy    . Breast biopsy      left breast  . Mastectomy modified radical  06/08/2012    Procedure: MASTECTOMY MODIFIED RADICAL;  Surgeon: Mariella Saa, MD;  Location: [facility] OR;  Service: General;  Laterality: Right;  . Portacath placement  06/08/2012    Procedure: INSERTION PORT-A-CATH;  Surgeon: Mariella Saa, MD;  Location: [facility] OR;  Service: General;  Laterality: Left;  . Peg tube placement     Clinical Impression:Tarnesha F Geck is a 57 y.o. female with history of breast cancer (diagnosed 01/14) with intolerance of chemo, Atrial fibrillation, malnutrition requiring PEG 08/27/12 during recent admission,;who was discharged to home 09/06/12. Family reported difficulty getting patient out of  the car and into the home due to inability walk and BLE weakness. She was readmitted on 04/29 with severely depressed mood, complaints of weakness and refusal to continue any further interventions. Palliative care and psychiatary consulted for assistance and patient was agreeable to start medications to help with mood stabilization as well as continued medical interventions. She was started on Zyprexa to help with insomnia and lexapro dose increased to  help with mood stabilization. XRT to be initiated in the future. Po intake remains poor but she is tolerating TF without recurrent diarrhea. Therapies initiated and CIR recommended.Patient transferred to CIR on 09/13/2012 .    Patient currently requires max-total with basic self-care skills and IADL secondary to muscle weakness and muscle joint tightness and decreased sitting balance, decreased standing balance, decreased postural control and decreased balance strategies, as well as increased anxiety. Prior to hospitalization, patient could complete ADLs and IADLs independently (prior to breast CA diagnoses).  Patient will benefit from skilled intervention to increase independence with basic self-care skills prior to discharge home with care partner.  Anticipate patient will require 24 hour supervision and follow up home health.  OT - End of Session Activity Tolerance: Tolerates 10 - 20 min activity with multiple rests Endurance Deficit: Yes Endurance Deficit Description: general fatigue and overall decreased strength & endurance OT Assessment Rehab Potential: Excellent Barriers to Discharge: None (none known at this time) OT Plan OT Intensity: Minimum of 1-2 x/day, 45 to 90 minutes OT Frequency: 5 out of 7 days OT Duration/Estimated Length of Stay: ~2 weeks OT Treatment/Interventions: Balance/vestibular training;Community reintegration;Discharge planning;Disease mangement/prevention;DME/adaptive equipment instruction;Functional mobility training;Pain management;Patient/family education;Psychosocial support;Self Care/advanced ADL retraining;Skin care/wound managment;Splinting/orthotics;Therapeutic Activities;Therapeutic Exercise;UE/LE Strength taining/ROM;UE/LE Coordination activities;Wheelchair propulsion/positioning OT Recommendation Patient destination: Home Follow Up Recommendations: Home health OT Equipment Recommended: Tub/shower bench   Precautions/Restrictions   Precautions Precautions: Fall Precaution Comments: Monitor HR Restrictions Weight Bearing Restrictions: No  General Chart Reviewed: Yes Family/Caregiver Present: Yes (Husband)  Vital Signs Therapy Vitals Temp: 99.1 F (37.3 C) Temp src: Oral Pulse Rate: 73 Resp: 18 BP: 109/68 mmHg Patient Position, if appropriate: Lying Oxygen Therapy SpO2: 97 % O2 Device: None (Room air)  Pain Pain Assessment Pain Assessment: No/denies pain Pain Score: 0-No pain Faces Pain Scale: No hurt  Home Living/Prior Functioning Home Living Lives With: Spouse;Family (husband, daughter, son-in-law, and 2 grandchildren) Available Help at Discharge: Family;Available 24 hours/day Type of Home: House Home Access: Stairs to enter [employer] of Steps: 2 stairs..plans to get ramp Entrance Stairs-Rails: None Home Layout: One level Bathroom Shower/Tub: Tub/shower unit;Curtain Firefighter: Standard Bathroom Accessibility: Yes How Accessible: Accessible via walker Home Adaptive Equipment: Wheelchair - manual;Bedside commode/3-in-1;Walker - rolling;Shower chair without back IADL History Homemaking Responsibilities: Yes Meal Prep Responsibility: Primary Laundry Responsibility: Primary Cleaning Responsibility: Primary Bill Paying/Finance Responsibility: Primary Shopping Responsibility: Primary Current License: Yes Occupation: Full time employment 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 [employer]  ADL - See FIM  Vision/Perception  Vision - History Baseline Vision: Other (comment) (for night time driving and reading) Patient Visual Report: No change from baseline Vision - Assessment Eye Alignment: Within Functional Limits Perception Perception: Within Functional Limits Praxis Praxis: Intact   Cognition Overall Cognitive Status: Within Functional Limits for tasks assessed Arousal/Alertness: Awake/alert Orientation Level: Oriented X4 Awareness: Appears intact Problem Solving:  Appears intact Safety/Judgment: Appears intact Comments: patient presents anxious during evlauation  Sensation Sensation Additional Comments: BUEs appear intact Coordination Gross Motor Movements are Fluid and Coordinated: Yes Fine Motor Movements are Fluid and Coordinated: Yes  Motor - See Evaluation Navigator  Trunk/Postural Assessment - See Evaluation Navigator  Balance- See Evaluation Navigator  Extremity/Trunk Assessment RUE Assessment RUE Assessment: Within Functional Limits (can benefit from functional strengthening) LUE Assessment LUE Assessment: Within Functional Limits (can benefit from functional strengthening)  FIM:  FIM - Grooming Grooming Steps: Wash, rinse, dry face;Wash, rinse, dry hands;Oral care, brush teeth, clean dentures Grooming: 5: Set-up assist to open containers (patient with general weakness making it hard to open items) FIM - Bathing Bathing: 0: Activity did not occur FIM - Upper Body Dressing/Undressing Upper body dressing/undressing steps patient completed: Thread/unthread right sleeve of pullover shirt/dresss;Thread/unthread left sleeve of pullover shirt/dress Upper body dressing/undressing: 3: Mod-Patient completed 50-74% of tasks FIM - Lower Body Dressing/Undressing Lower body dressing/undressing: 1: Total-Patient completed less than 25% of tasks FIM - Toileting Toileting: 0: Activity did not occur FIM - Banker Devices: Walker;Bed rails;HOB elevated Bed/Chair Transfer: 2: Supine > Sit: Max A (lifting assist/Pt. 25-49%);2: Bed > Chair or W/C: Max A (lift and lower assist) FIM - Archivist Transfers: 0-Activity did not occur FIM - Tub/Shower Transfers Tub/shower Transfers: 0-Activity did not occur or was simulated   Refer to Care Plan for Long Term Goals  Recommendations for other services: Neuropsych  Discharge Criteria: Patient will be discharged from OT if patient refuses treatment 3  consecutive times without medical reason, if treatment goals not met, if there is a change in medical status, if patient makes no progress towards goals or if patient is discharged from hospital.  The above assessment, treatment plan, treatment alternatives and goals were discussed and mutually agreed upon: by patient  --------------------------------------------------------------------------------------------------  SESSION NOTES  Session #1 [phone] - 60 Minutes Individual Therapy No complaints of pain Initial 1:1 occupational therapy evaluation completed. Focused skilled intervention on bed mobility, sit<>stands, edge of bed -> w/c stand pivot transfer, UB/LB dressing in sit<>stand position at sink, grooming tasks seated at sink, and overall activity tolerance/endurance. Therapist administered patient w/c and rolling walker to use during her rehab stay. At end of session, left patient seated in w/c beside bed with call bell & phone within reach.   Session #2 [phone] - 45 Minutes Individual Therapy No complaints of pain Patient found seated in w/c. Therapist donned bilateral TEDs to help decrease swelling throughout LEs. Therapist then propelled patient into bathroom for toilet transfer using grab bars and elevated toilet seat with moderate assistance <>elevated toilet seat. Patient then engaged in sit<>stand exercises using grab bars. Discussed body mechanics for safe and functional sit<>stands. Patient with decreased ROM throughout BLEs secondary to edema. Patient preferred to get back to bed at end of session. Therapist assisted patient with w/c -> edge of bed -> supine and positioned patient, elevating BLEs. Encouraged patient to get up and sit in w/c around dinner time.   Terrye Dombrosky 09/14/2012, 9:02 AM

## 2012-09-14 NOTE — Evaluation (Signed)
Physical Therapy Assessment and Plan  Patient Details  Name: CIRCE CHILTON MRN: [id number] Date of Birth: 1955/08/01  PT Diagnosis: Abnormality of gait, Difficulty walking, Edema and Muscle weakness Rehab Potential: Good ELOS: 2 weeks   Today's Date: 09/14/2012 Time: [id number] Time Calculation (min): 55 min  Problem List:  Patient Active Problem List   Diagnosis Date Noted  . Physical deconditioning 09/13/2012  . DJD (degenerative joint disease) of lumbar spine   . Malnutrition 09/07/2012  . CHF, acute 08/25/2012  . Cellulitis of leg, left 08/25/2012  . Anasarca 08/24/2012  . UTI (lower urinary tract infection) 08/24/2012  . Diarrhea 08/06/2012  . Hyperkalemia 08/06/2012  . Acute renal insufficiency 07/23/2012  . Edema 07/23/2012  . A-fib 07/14/2012  . Hypokalemia 07/13/2012  . Hyponatremia 07/13/2012  . Obesity, unspecified 07/13/2012  . Severe malnutrition in the context of acute illness 07/13/2012  . Neutropenic fever 07/12/2012  . Microcytic anemia 07/12/2012  . Thrombocytopenia 07/12/2012  . Cancer of breast 05/27/2012  . Dizziness 07/28/2011  . Congestion of nasal sinus 07/28/2011  . Allergic rhinitis, cause unspecified 07/28/2011  . Dyspnea on exertion 09/27/2010  . Liver disease, chronic, with cirrhosis   . Morbid obesity 08/03/2009  . IRON DEFICIENCY 06/08/2009  . DIABETES MELLITUS, TYPE II 10/16/2008  . 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 FATTY LIVER DISEASE 09/28/2008  . SPLENOMEGALY 09/28/2008  . Unspecified vitamin D deficiency 08/16/2008  . FATIGUE 08/16/2008  . 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 ARTHRITIS 03/30/2007  . HYPOTHYROIDISM 03/24/2007  . OBSTRUCTIVE SLEEP APNEA 03/24/2007  . HYPERGLYCEMIA 03/24/2007  . HYPERLIPIDEMIA 10/06/2006  . DEPRESSION 10/06/2006  . HYPERTENSION 10/06/2006  . GERD 10/06/2006  . LIVER FUNCTION TESTS, ABNORMAL 10/06/2006  . DVT, HX OF 10/06/2006    Past Medical History:  Past Medical History   Diagnosis Date  . Depression   . DVT (deep venous thrombosis)     hx of on HRT left leg ~2006  . GERD (gastroesophageal reflux disease)   . Hyperlipidemia   . Hypertension   . Hypothyroidism   . PPD positive, treated     rx inh   . OSA on CPAP   . Diabetes mellitus   . 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 Breast cancer     a. 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 DJD (degenerative joint disease) of lumbar spine    Past Surgical History:  Past Surgical History  Procedure Laterality Date  . Tubal ligation    . Cholecystectomy    . Foot surgery    . Eye surgery    . Cataract extraction    . Percutaneous liver biopsy    . Breast biopsy      left breast  . Mastectomy modified radical  06/08/2012    Procedure: MASTECTOMY MODIFIED RADICAL;  Surgeon: Mariella Saa, MD;  Location: [facility] OR;  Service: General;  Laterality: Right;  . Portacath placement  06/08/2012    Procedure: INSERTION PORT-A-CATH;  Surgeon: Mariella Saa, MD;  Location: [facility] OR;  Service: General;  Laterality: Left;  . Peg tube placement      Assessment & Plan Clinical Impression: Patient is a 57 y.o. year old female with history of breast cancer (diagnosed 01/14) with intolerance of chemo, Atrial fibrillation, malnutrition requiring PEG 08/27/12 during recent admission, who was discharged to home 09/06/12. Family reported  difficulty getting patient out of the car and into the home due to inability walk and BLE weakness. She was readmitted on 04/29 with severely depressed mood, complaints of weakness and refusal to continue any further interventions. Palliative care and psychiatary consulted for assistance and patient was agreeable to start medications to help with mood stabilization as well as continued medical interventions. She was started on Zyprexa to help with insomnia and lexapro  dose increased to help with mood stabilization. XRT to be initiated in the future. Po intake remains poor but she is tolerating TF without recurrent diarrhea. Patient transferred to CIR on 09/13/2012 .   Patient currently requires mod to max A with mobility secondary to muscle weakness, muscle joint tightness and edema BLE, decreased cardiorespiratoy endurance and decreased standing balance and decreased balance strategies.  Prior to hospitalization, patient was min with mobility and lived with Spouse;Family ((husband, daughter, son in law, and 2 grandkids)) in a House.  Home access is 2 without rails; husband mentioned looking into getting a ramp..  Patient will benefit from skilled PT intervention to maximize safe functional mobility, minimize fall risk and decrease caregiver burden for planned discharge home with 24 hour assist.  Anticipate patient will benefit from follow up [facility] at discharge.  PT - End of Session Activity Tolerance: Tolerates 30+ min activity with multiple rests Endurance Deficit: Yes Endurance Deficit Description: multiple rest breaks/cues for deep breathing; increase anxiety PT Assessment Rehab Potential: Good Barriers to Discharge: Other (comment) (Will need rails for stairs or ramp built) PT Plan PT Intensity: Minimum of 1-2 x/day ,45 to 90 minutes PT Frequency: 5 out of 7 days (monitor if need to be 15/7) PT Duration Estimated Length of Stay: 2 weeks PT Treatment/Interventions: Ambulation/gait training;Balance/vestibular training;Community reintegration;Discharge planning;Disease management/prevention;DME/adaptive equipment instruction;Functional mobility training;Neuromuscular re-education;Pain management;Patient/family education;Psychosocial support;Skin care/wound management;Splinting/orthotics;Stair training;Therapeutic Activities;Therapeutic Exercise;UE/LE Strength taining/ROM;UE/LE Coordination activities;Wheelchair propulsion/positioning PT  Recommendation Recommendations for Other Services: Neuropsych consult Follow Up Recommendations: Home health PT Patient destination: Home Equipment Recommended: None recommended by PT ((already has RW and w/c))  Skilled Therapeutic Intervention Individual treatment initiated with focus on techniques for sit to stands, standing balance, bed mobility, and gait with RW. Pt with flexed posture in standing and during gait noted decreased step length, narrow BOS, decreased mobility at ankles (signifcant edema in LE's limiting full ROM as well). Pt requires frequent encouragement during session due to anxiety often asking "Am I doing ok?" with husband requiring positive reinforcement as well.   PT Evaluation Precautions/Restrictions Precautions Precautions: Fall Precaution Comments: Monitor HR Restrictions Weight Bearing Restrictions: No Pain Pain Assessment Pain Assessment: No/denies pain Pain Score: 0-No pain Faces Pain Scale: No hurt Home Living/Prior Functioning Home Living Lives With: Spouse;Family ((husband, daughter, son in law, and 2 grandkids)) Available Help at Discharge: Family;Available 24 hours/day Type of Home: House Home Access: Stairs to enter [employer] of Steps: 2 without rails; husband mentioned looking into getting a ramp. Entrance Stairs-Rails: None Home Layout: One level Bathroom Shower/Tub: Forensic scientist: Standard Bathroom Accessibility: Yes How Accessible: Accessible via walker Home Adaptive Equipment: Wheelchair - manual;Bedside commode/3-in-1;Walker - rolling;Shower chair without back Vision/Perception  Vision - History Baseline Vision: Other (comment) (for night time driving and reading) Patient Visual Report: No change from baseline Vision - Assessment Eye Alignment: Within Functional Limits Perception Perception: Within Functional Limits Praxis Praxis: Intact  Cognition Overall Cognitive Status: Within  Functional Limits for tasks assessed Arousal/Alertness: Awake/alert Orientation Level: Oriented X4 Awareness: Appears intact Problem Solving: Appears intact  Safety/Judgment: Appears intact Comments: Pt very anxious Sensation Sensation Light Touch: Appears Intact Additional Comments: BUEs appear intact Coordination Gross Motor Movements are Fluid and Coordinated: Yes Fine Motor Movements are Fluid and Coordinated: Yes Motor  Motor Motor: Within Functional Limits (generalized deconditioning)  Locomotion  Ambulation Ambulation/Gait Assistance: 4: Min assist  Balance Balance Balance Assessed: Yes Static Sitting Balance Static Sitting - Level of Assistance: 5: Stand by assistance Dynamic Sitting Balance Dynamic Sitting - Level of Assistance: 4: Min assist Static Standing Balance Static Standing - Level of Assistance: 4: Min assist Dynamic Standing Balance Dynamic Standing - Level of Assistance: 3: Mod assist Extremity Assessment  RUE Assessment RUE Assessment: Within Functional Limits (can benefit from functional strengthening) LUE Assessment LUE Assessment: Within Functional Limits (can benefit from functional strengthening) RLE Assessment RLE Assessment: Exceptions to [facility] (significant edema limiting ROM; strength grossly 3 to 3+/5) LLE Assessment LLE Assessment: Exceptions to [facility] (significant edema limiting ROM; strength grossly 3 to 3+/5)  FIM:  FIM - Bed/Chair Transfer Bed/Chair Transfer Assistive Devices: Therapist, occupational: 2: Supine > Sit: Max A (lifting assist/Pt. 25-49%);3: Sit > Supine: Mod A (lifting assist/Pt. 50-74%/lift 2 legs);2: Bed > Chair or W/C: Max A (lift and lower assist);2: Chair or W/C > Bed: Max A (lift and lower assist) FIM - Locomotion: Wheelchair Locomotion: Wheelchair: 1: Total Assistance/staff pushes wheelchair (Pt<25%) FIM - Locomotion: Ambulation Ambulation/Gait Assistance: 4: Min assist Locomotion: Ambulation: 1: Travels less than 50  ft with minimal assistance (Pt.>75%)   Refer to Care Plan for Long Term Goals  Recommendations for other services: Neuropsych  Discharge Criteria: Patient will be discharged from PT if patient refuses treatment 3 consecutive times without medical reason, if treatment goals not met, if there is a change in medical status, if patient makes no progress towards goals or if patient is discharged from hospital.  The above assessment, treatment plan, treatment alternatives and goals were discussed and mutually agreed upon: by patient and by family  Philip Aspen, PT, DPT  09/14/2012, 9:37 AM

## 2012-09-14 NOTE — Progress Notes (Signed)
Patient information reviewed and entered into eRehab system by Tomoko Sandra, RN, CRRN, PPS Coordinator.  Information including medical coding and functional independence measure will be reviewed and updated through discharge.    

## 2012-09-14 NOTE — Progress Notes (Signed)
RT Note: Pt has home CPAP at bedside and plans to wear again tonight. H2O filled. Pt states she does not need any further assistance RT will continue to monitor

## 2012-09-14 NOTE — Progress Notes (Signed)
Subjective/Complaints: Diarrhea again this am. Nose bleeding. Concerned about whether we will "keep her here" if she has further medical problems.  A 12 point review of systems has been performed and if not noted above is otherwise negative.   Objective: Vital Signs: Blood pressure 109/68, pulse 73, temperature 99.1 F (37.3 C), temperature source Oral, resp. rate 18, weight 98.5 kg (217 lb 2.5 oz), SpO2 97.00%. No results found.  Recent Labs  09/14/12 0500  WBC 9.0  HGB 8.7*  HCT 26.6*  PLT 197    Recent Labs  09/14/12 0500  NA 138  K 4.1  CL 106  GLUCOSE 105*  BUN 14  CREATININE 0.51  CALCIUM 7.2*   CBG (last 3)   Recent Labs  09/13/12 1656 09/13/12 2059 09/14/12 0721  GLUCAP 113* 120* 105*    Wt Readings from Last 3 Encounters:  09/14/12 98.5 kg (217 lb 2.5 oz)  09/08/12 97.977 kg (216 lb)  09/06/12 97.6 kg (215 lb 2.7 oz)    Physical Exam:  Constitutional: She is oriented to person, place, and time. She appears well-developed. Appears fatigued. obese  HENT: oral mucosa dry. Crusted/blood at corners of mouth. Dry blood on cover. Head: Normocephalic and atraumatic.  Eyes: Pupils are equal, round, and reactive to light.  Neck: Normal range of motion.  Cardiovascular: Normal rate and regular rhythm. No murmurs  Pulmonary/Chest: Effort normal and breath sounds normal. Bilateral mastectomy scars  Abdominal: Soft. Bowel sounds are normal. She exhibits no distension. There is no tenderness.  PEG in place without drainage.  Musculoskeletal: She exhibits edema (BLE with 1+ edema.).  Foam dressing bilateral feet. --no break down.  Neurological: She is alert and oriented to person, place, and time.    Cognitively with basic insight and awareness. Some STM deficits. Motor: 4/5 in bilateral deltoid, biceps, triceps3 minus/5 in bilateral hip flexor knee extensor ankle dorsiflexor and plantar flexor  Sensory to light touch is intact in the upper and lower limbs   Speech is clear. No language deficits Psych: pleasant but anxious.     Assessment/Plan: 1. Functional deficits secondary to deconditioning/FTT after breast cancer and associate complications  which require 3+ hours per day of interdisciplinary therapy in a comprehensive inpatient rehab setting. Physiatrist is providing close team supervision and 24 hour management of active medical problems listed below. Physiatrist and rehab team continue to assess barriers to discharge/monitor patient progress toward functional and medical goals. FIM:                   Comprehension Comprehension Mode: Auditory Comprehension: 5-Understands complex 90% of the time/Cues < 10% of the time  Expression Expression Mode: Verbal Expression: 5-Expresses complex 90% of the time/cues < 10% of the time  Social Interaction Social Interaction: 7-Interacts appropriately with others - No medications needed.  Problem Solving Problem Solving: 5-Solves complex 90% of the time/cues < 10% of the time  Memory Memory: 5-Recognizes or recalls 90% of the time/requires cueing < 10% of the time  Medical Problem List and Plan:  1. DVT Prophylaxis/Anticoagulation: On coumadin for atrial fibrillation.  2. Pain Management: Morphine prn  3. Depression: Team to provide ego support. To continue Wellbutrin, Lexapro and Zyprexa. Will have LCSW follow for support. Will consult neuropsychiatry and chaplain additionally while in CIR.  4. Neuropsych: This patient is capable of making decisions on her own behalf.  5. HTN: Monitor on bid basis. Will check daily weights to monitor for fluid overload issues. Resume Lasix and/or Cardizem  as indicated.  6. OSA: continue CPAP at nights.  7. Cirrhosis of liver-autoimmune?:  8. Atrial Fibrillation with RVR: Will continue to monitor HR on bid basis. Has been off Cardizem. Continue metoprolol. Will taper amiodarone to 200mg  daily per prior cardiology note. May help with GI symptoms.   9.Anemia: Recheck in am. Has had steady drop in the past month. H/o heme positive stool. Will order stool guaiacs and check serial H/H.   -with continued drop, have to consider the risk/benefits of coumadin 10. DM type 2-diet controlled: Regular diet for now to help with food choices and appetite. Continue TF 6 X day and monitor intake. Will have RD adjusting rate/ timing to help with po's. Continue CBG checks ac/hs with SSI to help with elevated BS.  11. Breast cancer: On Femara. For XRT once stronger. Patient to follow up with Dr. Michell Heinrich aftert discharge 12. Diarrhea:  -likely TF related.  -will check cdiff  -schedule imodium in addition to prn doses  LOS (Days) 1 A FACE TO FACE EVALUATION WAS PERFORMED  [name] T 09/14/2012 7:53 AM

## 2012-09-15 ENCOUNTER — Inpatient Hospital Stay (HOSPITAL_COMMUNITY): Payer: 59

## 2012-09-15 ENCOUNTER — Inpatient Hospital Stay (HOSPITAL_COMMUNITY): Payer: 59 | Admitting: Occupational Therapy

## 2012-09-15 LAB — CBC
HCT: 25.1 % — ABNORMAL LOW (ref 36.0–46.0)
RDW: 26.1 % — ABNORMAL HIGH (ref 11.5–15.5)
WBC: 9.1 10*3/uL (ref 4.0–10.5)

## 2012-09-15 LAB — PROTIME-INR
INR: 3.54 — ABNORMAL HIGH (ref 0.00–1.49)
Prothrombin Time: 33.4 seconds — ABNORMAL HIGH (ref 11.6–15.2)

## 2012-09-15 LAB — GLUCOSE, CAPILLARY

## 2012-09-15 MED ORDER — CLONAZEPAM 0.5 MG PO TABS
0.2500 mg | ORAL_TABLET | Freq: Two times a day (BID) | ORAL | Status: DC
  Administered 2012-09-15 – 2012-09-28 (×26): 0.25 mg via ORAL
  Filled 2012-09-15 (×18): qty 1
  Filled 2012-09-15: qty 2
  Filled 2012-09-15 (×7): qty 1

## 2012-09-15 NOTE — Progress Notes (Signed)
Occupational Therapy Session Note  Patient Details  Name: [name] MRN: [id number] Date of Birth: 1956/02/21  Today's Date: 09/15/2012 Time: 0850-0950 Time Calculation (min): 60 min  Short Term Goals: Week 1:  OT Short Term Goal 1 (Week 1): Patient will perform toilet transfer with moderate assistance using DME & AD prn OT Short Term Goal 2 (Week 1): Patient will bathe 10/10 body parts with moderate assistance in sit<>stand position using AE prn OT Short Term Goal 3 (Week 1): Patient will perform LB dsg (pants only) with minimal assistance in sit<>stand position OT Short Term Goal 4 (Week 1): Patient will donn shirt with minimal assistance, seated un-supported OT Short Term Goal 5 (Week 1): AE for LB ADLs will be introduced to patient  Skilled Therapeutic Interventions/Progress Updates:    Pt worked on UB bathing and dressing sitting in wheelchair at the sink with supervision.  Pt performed LB dressing, just donning socks with sockaide and reacher with supervision.  Pt with increased breathing during session but HR remained in the low to mid 80s.  Oxygen initially 92% during bathing.  Pt then worked on sit to stand with mod assist after finishing grooming tasks.  After standing for 1 min O2 sats decreased to 87% and took approximately 1 min to increase back to 90%.  Therapy Documentation Precautions:  Precautions Precautions: Fall Precaution Comments: Monitor HR Restrictions Weight Bearing Restrictions: No  Pain: Pain Assessment Pain Assessment: No/denies pain ADL: See FIM for current functional status  Therapy/Group: Individual Therapy  Marius Betts OTR/L 09/15/2012, 12:13 PM

## 2012-09-15 NOTE — Progress Notes (Signed)
Subjective/Complaints: Anxious this am. Still "grateful to be here." concerned about whether not she is meeting expectations A 12 point review of systems has been performed and if not noted above is otherwise negative.   Objective: Vital Signs: Blood pressure 129/59, pulse 75, temperature 99.5 F (37.5 C), temperature source Oral, resp. rate 20, weight 101.2 kg (223 lb 1.7 oz), SpO2 94.00%. No results found.  Recent Labs  09/14/12 0500 09/15/12 0603  WBC 9.0 9.1  HGB 8.7* 8.5*  HCT 26.6* 25.1*  PLT 197 216    Recent Labs  09/14/12 0500  NA 138  K 4.1  CL 106  GLUCOSE 105*  BUN 14  CREATININE 0.51  CALCIUM 7.2*   CBG (last 3)   Recent Labs  09/14/12 1633 09/14/12 2034 09/15/12 0727  GLUCAP 104* 127* 86    Wt Readings from Last 3 Encounters:  09/15/12 101.2 kg (223 lb 1.7 oz)  09/08/12 97.977 kg (216 lb)  09/06/12 97.6 kg (215 lb 2.7 oz)    Physical Exam:  Constitutional: She is oriented to person, place, and time. She appears well-developed. Appears fatigued. obese  HENT: oral mucosa dry. Crusted/blood at corners of mouth. Dry blood on cover. Head: Normocephalic and atraumatic.  Eyes: Pupils are equal, round, and reactive to light.  Neck: Normal range of motion.  Cardiovascular: Normal rate and regular rhythm. No murmurs  Pulmonary/Chest: Effort normal and breath sounds normal. Bilateral mastectomy scars  Abdominal: Soft. Bowel sounds are normal. She exhibits no distension. There is no tenderness.  PEG in place without drainage.  Musculoskeletal: She exhibits edema (BLE with 1+ edema.).  Foam dressing bilateral feet. --no break down.  Neurological: She is alert and oriented to person, place, and time.    Cognitively with basic insight and awareness. mild STM deficits. Motor: 4/5 in bilateral deltoid, biceps, triceps3 minus/5 in bilateral hip flexor knee extensor ankle dorsiflexor and plantar flexor  Sensory to light touch is intact in the upper and lower  limbs  Speech is clear. No language deficits Psych: pleasant but anxious.     Assessment/Plan: 1. Functional deficits secondary to deconditioning/FTT after breast cancer and associate complications  which require 3+ hours per day of interdisciplinary therapy in a comprehensive inpatient rehab setting. Physiatrist is providing close team supervision and 24 hour management of active medical problems listed below. Physiatrist and rehab team continue to assess barriers to discharge/monitor patient progress toward functional and medical goals. FIM: FIM - Bathing Bathing: 0: Activity did not occur  FIM - Upper Body Dressing/Undressing Upper body dressing/undressing steps patient completed: Thread/unthread right sleeve of pullover shirt/dresss;Thread/unthread left sleeve of pullover shirt/dress Upper body dressing/undressing: 3: Mod-Patient completed 50-74% of tasks FIM - Lower Body Dressing/Undressing Lower body dressing/undressing: 1: Total-Patient completed less than 25% of tasks  FIM - Toileting Toileting: 0: Activity did not occur  FIM - Diplomatic Services operational officer Devices: Elevated toilet seat;Grab bars Toilet Transfers: 3-To toilet/BSC: Mod A (lift or lower assist);3-From toilet/BSC: Mod A (lift or lower assist)  FIM - Bed/Chair Transfer Bed/Chair Transfer Assistive Devices: Manufacturing systems engineer Transfer: 2: Supine > Sit: Max A (lifting assist/Pt. 25-49%);3: Sit > Supine: Mod A (lifting assist/Pt. 50-74%/lift 2 legs);2: Bed > Chair or W/C: Max A (lift and lower assist);2: Chair or W/C > Bed: Max A (lift and lower assist)  FIM - Locomotion: Wheelchair Locomotion: Wheelchair: 1: Total Assistance/staff pushes wheelchair (Pt<25%) FIM - Locomotion: Ambulation Ambulation/Gait Assistance: 4: Min assist Locomotion: Ambulation: 1: Travels less than 50 ft  with minimal assistance (Pt.>75%)  Comprehension Comprehension Mode: Auditory Comprehension: 5-Understands complex 90% of  the time/Cues < 10% of the time  Expression Expression Mode: Verbal Expression: 5-Expresses complex 90% of the time/cues < 10% of the time  Social Interaction Social Interaction: 5-Interacts appropriately 90% of the time - Needs monitoring or encouragement for participation or interaction.  Problem Solving Problem Solving: 5-Solves complex 90% of the time/cues < 10% of the time  Memory Memory: 5-Recognizes or recalls 90% of the time/requires cueing < 10% of the time  Medical Problem List and Plan:  1. DVT Prophylaxis/Anticoagulation: On coumadin for atrial fibrillation.  2. Pain Management: Morphine prn  3. Depression: Team to provide ego support. To continue Wellbutrin, Lexapro and Zyprexa. Will have LCSW follow for support. neuropsych eval pending   -add low dose klonopin to further assist with anxiety 4. Neuropsych: This patient is capable of making decisions on her own behalf.  5. HTN: Monitor on bid basis. Will check daily weights to monitor for fluid overload issues. Resume Lasix and/or Cardizem as indicated.  6. OSA: continue CPAP at nights.  7. Cirrhosis of liver-autoimmune?:  8. Atrial Fibrillation with RVR: Will continue to monitor HR on bid basis. Has been off Cardizem. Continue metoprolol. Will taper amiodarone to 200mg  daily per prior cardiology note. May help with GI symptoms.  9.Anemia: continues to drift down  -with continued drop, have to consider the risk/benefits of coumadin 10. DM type 2-diet controlled: Regular diet for now to help with food choices and appetite. Continue TF 6 X day and monitor intake. Will have RD adjusting rate/ timing to help with po's. Continue CBG checks ac/hs with SSI to help with elevated BS.  11. Breast cancer: On Femara. For XRT once stronger. Patient to follow up with Dr. Michell Heinrich aftert discharge 12. Diarrhea:  -likely TF related.  -no more stool since loose stool on 5/5  -schedule imodium in addition to prn doses  LOS (Days) 2 A  FACE TO FACE EVALUATION WAS PERFORMED  SWARTZ,ZACHARY T 09/15/2012 7:52 AM

## 2012-09-15 NOTE — Progress Notes (Signed)
Physical Therapy Session Note  Patient Details  Name: [name] MRN: [id number] Date of Birth: 05/15/1955  Today's Date: 09/15/2012 Time: 1030-1128 Time Calculation (min): 58 min  Short Term Goals: Week 1:  PT Short Term Goal 1 (Week 1): Pt will be able to complete bed mobility with mod A using adaptive equipment if needed PT Short Term Goal 2 (Week 1): Pt will be able to complete sit to stands consistently with min A PT Short Term Goal 3 (Week 1): Pt will be able to gait x 50' with min A PT Short Term Goal 4 (Week 1): Pt will be able to go up/down 2 steps for home entry with mod A  Skilled Therapeutic Interventions/Progress Updates:    Discussion with pt on expectation/goals on rehab due to pt stating she was nervous about being "kicked out." Emotional support and encouragement provided. Focused on sit to stands from elevated mat with cues for anterior weightshift to aid with technique and then dynamic balance activity to reach for and place horseshoes overhead on basketball hoop with overall min A. Multiple rest breaks needed due to fatigue - O2 decreased to 88% but cues given for deep breathing and increased to 92%; HR = 79-85 bpm. Attempted stairs (4" step) but pt unable to get either leg onto the step without total A. Demonstrated and performed seated therex for functional strengthening with encouragement for pt to continue to do in room throughout day including heel/toe raises, LAQ, and seated marches x 10 reps x 2 sets with cues for breathing. Short distance gait in room with RW to return to recliner to rest with LE's elevated for edema control. Mod A needed for sit to stand.   Therapy Documentation Precautions:  Precautions Precautions: Fall Precaution Comments: Monitor HR Restrictions Weight Bearing Restrictions: No   Pain: Pain Assessment Pain Assessment: No/denies pain  See FIM for current functional status  Therapy/Group: Individual Therapy  [name]  [facility] 09/15/2012, 11:30 AM

## 2012-09-15 NOTE — Progress Notes (Signed)
ANTICOAGULATION CONSULT NOTE - Follow Up Consult  Pharmacy Consult for coumadin Indication: atrial fibrillation  Allergies  Allergen Reactions  . Olmesartan Medoxomil Cough    REACTION: ? if cough  . Tetracycline Hcl     Unknown reaction, too long for patient to remember   . Venlafaxine     REACTION: severe dry moouth  . Adhesive (Tape) Rash    Patient Measurements: Weight: 223 lb 1.7 oz (101.2 kg) Heparin Dosing Weight:   Vital Signs: Temp: 99.5 F (37.5 C) (05/07 0523) Temp src: Oral (05/07 0523) BP: 129/59 mmHg (05/07 0523) Pulse Rate: 75 (05/07 0523)  Labs:  Recent Labs  09/13/12 0645 09/14/12 0500 09/15/12 0603  HGB  --  8.7* 8.5*  HCT  --  26.6* 25.1*  PLT  --  197 216  LABPROT 27.9* 29.8* 33.4*  INR 2.77* 3.03* 3.54*  CREATININE  --  0.51  --     The CrCl is unknown because both a height and weight (above a minimum accepted value) are required for this calculation.   Medications:  Scheduled:  . amiodarone  200 mg Oral BID   Followed by  . [START ON 09/16/2012] amiodarone  200 mg Oral Daily  . antiseptic oral rinse  15 mL Mouth Rinse BID  . aspirin EC  81 mg Oral q morning - 10a  . buPROPion  150 mg Oral q morning - 10a  . clonazePAM  0.25 mg Oral BID  . escitalopram  20 mg Oral QHS  . feeding supplement (JEVITY 1.2 CAL)  360 mL Per Tube Custom  . free water  120 mL Per Tube Custom  . insulin aspart  0-9 Units Subcutaneous TID WC  . letrozole  2.5 mg Oral Daily  . levothyroxine  125 mcg Oral QAC breakfast  . loperamide  2 mg Oral TID  . metoprolol tartrate  75 mg Oral BID  . multivitamin with minerals  1 tablet Oral Daily  . OLANZapine  2.5 mg Oral QHS  . [COMPLETED] warfarin  2 mg Oral ONCE-1800  . Warfarin - Pharmacist Dosing Inpatient   Does not apply q1800  . [DISCONTINUED] feeding supplement (JEVITY 1.2 CAL)  237 mL Per Tube 6 X Daily  . [DISCONTINUED] feeding supplement  30 mL Per Tube Q1200  . [DISCONTINUED] free water  100 mL Per Tube  Q8H   Infusions:    Assessment: 57 yo female with afib is currently on supratherapeutic coumadin. INR today is 3.54 Goal of Therapy:  INR 2-3    Plan:  1) No coumadin tonight 2) INR in am  Jaecion Dempster, Tsz-Yin 09/15/2012,8:28 AM

## 2012-09-15 NOTE — Progress Notes (Signed)
Occupational Therapy Session Note  Patient Details  Name: LEKESHA CLAW MRN: [id number] Date of Birth: 1955/12/14  Today's Date: 09/15/2012 Time: 4401-0272 Time Calculation (min): 34 min   Skilled Therapeutic Interventions/Progress Updates:    Took pt to the OT gym to work on UE exercises from wheelchair level.  Pt used 1.5 lbs in each hand to perform 2 sets each of shoulder flexion, elbow flexion, and shoulder press.  She needed mod instructional cueing and min assist for technique during shoulder flexion and for presses as well.  Pt able to perform 10 repetitions for each exercise.  Transitioned to UE ergonometer for 2 sets of 3 mins each, 1 forward and 1 backwards.  Pt's O2 sats 90% and HR 75 after completing exercises.   Therapy Documentation Precautions:  Precautions Precautions: Fall Precaution Comments: Monitor HR Restrictions Weight Bearing Restrictions: No  Pain: Pain Assessment Pain Assessment: No/denies pain  See FIM for current functional status  Therapy/Group: Individual Therapy  Mehul Rudin OTR/L  09/15/2012, 4:12 PM

## 2012-09-15 NOTE — Progress Notes (Signed)
Inpatient RehabilitationTeam Conference and Plan of Care Update Date: 09/14/2012   Time: 2:05 PM   Patient Name: [name]      Medical Record Number: [id number]  Date of Birth: [birth year] Sex: Female         Room/Bed: 4008/4008-01 Payor Info: Payor: [place] [employer] EM[employer]OYEE  Plan: [employer]  Product Type: *No Product type*     Admitting Diagnosis: Deconditioning  breast ca  Admit Date/Time:  09/13/2012  4:29 PM Admission Comments: No comment available   Primary Diagnosis:  Physical deconditioning Principal Problem: Physical deconditioning  Patient Active Problem List   Diagnosis Date Noted  . Physical deconditioning 09/13/2012  . DJD (degenerative joint disease) of lumbar spine   . Malnutrition 09/07/2012  . CHF, acute 08/25/2012  . Cellulitis of leg, left 08/25/2012  . Anasarca 08/24/2012  . UTI (lower urinary tract infection) 08/24/2012  . Diarrhea 08/06/2012  . Hyperkalemia 08/06/2012  . Acute renal insufficiency 07/23/2012  . Edema 07/23/2012  . A-fib 07/14/2012  . Hypokalemia 07/13/2012  . Hyponatremia 07/13/2012  . Obesity, unspecified 07/13/2012  . Severe malnutrition in the context of acute illness 07/13/2012  . Neutropenic fever 07/12/2012  . Microcytic anemia 07/12/2012  . Thrombocytopenia 07/12/2012  . Cancer of breast 05/27/2012  . Dizziness 07/28/2011  . Congestion of nasal sinus 07/28/2011  . Allergic rhinitis, cause unspecified 07/28/2011  . Dyspnea on exertion 09/27/2010  . Liver disease, chronic, with cirrhosis   . Morbid obesity 08/03/2009  . IRON DEFICIENCY 06/08/2009  . DIABETES MELLITUS, TYPE II 10/16/2008  . 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 FATTY LIVER DISEASE 09/28/2008  . SPLENOMEGALY 09/28/2008  . Unspecified vitamin D deficiency 08/16/2008  . FATIGUE 08/16/2008  . 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 ARTHRITIS 03/30/2007  . HYPOTHYROIDISM 03/24/2007  . OBSTRUCTIVE SLEEP APNEA 03/24/2007  . HYPERGLYCEMIA 03/24/2007  .  HYPERLIPIDEMIA 10/06/2006  . DEPRESSION 10/06/2006  . HYPERTENSION 10/06/2006  . GERD 10/06/2006  . LIVER FUNCTION TESTS, ABNORMAL 10/06/2006  . DVT, HX OF 10/06/2006    Expected Discharge Date:  09/28/12  Team Members Present: MD: Dr Faith Rogue  RN: Daryll Brod, Bayard Hugger  CSW: Amada Jupiter  Jiles Crocker, Sherrine Maples OT: Edwin Cap, Ardis Rowan, Mackie Pai  SLP: Feliberto Gottron  Tora Duck, RN, PPS Coordinator  Lutricia Horsfall, RNCM        Current Status/Progress Goal Weekly Team Focus  Medical   deconditioning related to breast ca, FTT, depression  manage medical issues to allow participation in rehab  nutrition, wound care, cardiac mgt   Bowel/Bladder     (new admission)        Swallow/Nutrition/ Hydration             ADL's   overall max-total assist  overall supervision  ADL retraining, sit<>stands, dynamic standing, ADL transfers, functional strengthening and endurance   Mobility   mod to max A overall   supervision for gait and transfers; min A for car, stairs, and bed mobility  activity tolerance, dynamic standing balance, sit to stands/transfers, gait training, functional strengthening   Communication             Safety/Cognition/ Behavioral Observations       (new admission)          Pain        (new admission)          Skin        (new admission)  Rehab Goals Patient on target to meet rehab goals: Yes *See Care Plan and progress notes for long and short-term goals.  Barriers to Discharge: mood, severe deconditioning    Possible Resolutions to Barriers:  strength and stamina training, care giver ed    Discharge Planning/Teaching Needs:  home with husband and adult children to continue with 24/7 assistance      Team Discussion:  Discussion of pt's dx, hx and medical issues, discharge plan. Nutrition: needs increased protein. Pt requested to discontinue her DNR.  Supervision goals.  Revisions to Treatment  Plan:  none   Continued Need for Acute Rehabilitation Level of Care: The patient requires daily medical management by a physician with specialized training in physical medicine and rehabilitation for the following conditions: Daily direction of a multidisciplinary physical rehabilitation program to ensure safe treatment while eliciting the highest outcome that is of practical value to the patient.: Yes Daily medical management of patient stability for increased activity during participation in an intensive rehabilitation regime.: Yes Daily analysis of laboratory values and/or radiology reports with any subsequent need for medication adjustment of medical intervention for : Cardiac problems;Neurological problems (nutrition, breast ca)  [name] 09/15/2012, 11:47 AM

## 2012-09-15 NOTE — Progress Notes (Signed)
Physical Therapy Session Note  Patient Details  Name: [name] MRN: [id number] Date of Birth: 15-May-1955  Today's Date: 09/15/2012 Time: 4098-1191 Time Calculation (min): 45 min  Short Term Goals: Week 1:  PT Short Term Goal 1 (Week 1): Pt will be able to complete bed mobility with mod A using adaptive equipment if needed PT Short Term Goal 2 (Week 1): Pt will be able to complete sit to stands consistently with min A PT Short Term Goal 3 (Week 1): Pt will be able to gait x 50' with min A PT Short Term Goal 4 (Week 1): Pt will be able to go up/down 2 steps for home entry with mod A  Skilled Therapeutic Interventions/Progress Updates:    Pt still up in recliner and very anxious - verbalized this and visible shaking. Encouragement provided and RN aware (already medicated). Attempted multiple times sit to stand from this low surface; total A for sit to stand but then pt with posterior lean and increased anxiety in standing ("I am afraid I am going to fall") and descent back into the recliner. Attempted again but pt unable and anxiety increasing as becoming fatigued. Used [place] Dec Plus for transfer from recliner into w/c. W/c propulsion for overall endurance and strengthening with min to mod A needed. Encouragement provided to pt throughout session.   Therapy Documentation Precautions:  Precautions Precautions: Fall Precaution Comments: Monitor HR Restrictions Weight Bearing Restrictions: No  Pain:  Denies pain.    See FIM for current functional status  Therapy/Group: Individual Therapy  [name] [facility] 09/15/2012, 1:56 PM

## 2012-09-15 NOTE — Progress Notes (Signed)
[name] is active with [employer] program for [employer] employees with [employer]. Came to visit her while on rehab unit. However, she is with therapy. Will come back at later time for bedside visit.  Raiford Noble, MSN- Ed, [facility] [phone]

## 2012-09-16 ENCOUNTER — Inpatient Hospital Stay (HOSPITAL_COMMUNITY): Payer: 59

## 2012-09-16 ENCOUNTER — Inpatient Hospital Stay (HOSPITAL_COMMUNITY): Payer: 59 | Admitting: Occupational Therapy

## 2012-09-16 ENCOUNTER — Encounter (HOSPITAL_COMMUNITY): Payer: Self-pay | Admitting: Nurse Practitioner

## 2012-09-16 ENCOUNTER — Encounter (HOSPITAL_COMMUNITY): Payer: 59

## 2012-09-16 ENCOUNTER — Inpatient Hospital Stay (HOSPITAL_COMMUNITY): Payer: 59 | Admitting: *Deleted

## 2012-09-16 DIAGNOSIS — I5032 Chronic diastolic (congestive) heart failure: Secondary | ICD-10-CM

## 2012-09-16 DIAGNOSIS — I5033 Acute on chronic diastolic (congestive) heart failure: Secondary | ICD-10-CM

## 2012-09-16 LAB — GLUCOSE, CAPILLARY
Glucose-Capillary: 96 mg/dL (ref 70–99)
Glucose-Capillary: 108 mg/dL — ABNORMAL HIGH (ref 70–99)
Glucose-Capillary: 111 mg/dL — ABNORMAL HIGH (ref 70–99)
Glucose-Capillary: 87 mg/dL (ref 70–99)

## 2012-09-16 LAB — CBC
HCT: 25.3 % — ABNORMAL LOW (ref 36.0–46.0)
Hemoglobin: 8.4 g/dL — ABNORMAL LOW (ref 12.0–15.0)
MCV: 85.2 fL (ref 78.0–100.0)
RBC: 2.97 MIL/uL — ABNORMAL LOW (ref 3.87–5.11)
WBC: 10.1 10*3/uL (ref 4.0–10.5)

## 2012-09-16 LAB — TSH: TSH: 10.307 u[IU]/mL — ABNORMAL HIGH (ref 0.350–4.500)

## 2012-09-16 MED ORDER — AMIODARONE HCL 200 MG PO TABS: 200.0000 mg | ORAL_TABLET | Freq: Every day | ORAL | Status: DC

## 2012-09-16 MED ORDER — POTASSIUM CHLORIDE CRYS ER 20 MEQ PO TBCR
20.0000 meq | EXTENDED_RELEASE_TABLET | Freq: Three times a day (TID) | ORAL | Status: DC
  Administered 2012-09-16 (×2): 20 meq via ORAL
  Filled 2012-09-16 (×7): qty 1

## 2012-09-16 MED ORDER — FUROSEMIDE 10 MG/ML IJ SOLN
60.0000 mg | Freq: Once | INTRAMUSCULAR | Status: AC
  Administered 2012-09-16: 60 mg via INTRAVENOUS
  Filled 2012-09-16 (×2): qty 6

## 2012-09-16 MED ORDER — AMIODARONE HCL 200 MG PO TABS
200.0000 mg | ORAL_TABLET | Freq: Every day | ORAL | Status: DC
  Administered 2012-09-16: 200 mg via ORAL
  Filled 2012-09-16 (×3): qty 1

## 2012-09-16 MED ORDER — FUROSEMIDE 10 MG/ML IJ SOLN
40.0000 mg | Freq: Once | INTRAMUSCULAR | Status: DC
  Filled 2012-09-16: qty 4

## 2012-09-16 MED ORDER — FUROSEMIDE 20 MG PO TABS
20.0000 mg | ORAL_TABLET | Freq: Two times a day (BID) | ORAL | Status: DC
  Filled 2012-09-16 (×3): qty 1

## 2012-09-16 MED ORDER — POTASSIUM CHLORIDE CRYS ER 20 MEQ PO TBCR: 20.0000 meq | EXTENDED_RELEASE_TABLET | Freq: Two times a day (BID) | ORAL | Status: DC

## 2012-09-16 MED ORDER — FUROSEMIDE 10 MG/ML IJ SOLN
80.0000 mg | Freq: Three times a day (TID) | INTRAMUSCULAR | Status: DC
  Administered 2012-09-16 – 2012-09-20 (×9): 80 mg via INTRAVENOUS
  Filled 2012-09-16 (×14): qty 8

## 2012-09-16 MED ORDER — DRONABINOL 2.5 MG PO CAPS
2.5000 mg | ORAL_CAPSULE | Freq: Two times a day (BID) | ORAL | Status: DC
  Administered 2012-09-16 – 2012-09-20 (×10): 2.5 mg via ORAL
  Filled 2012-09-16 (×10): qty 1

## 2012-09-16 MED ORDER — OSMOLITE 1.5 CAL PO LIQD
360.0000 mL | Freq: Two times a day (BID) | ORAL | Status: DC
  Administered 2012-09-17 – 2012-09-27 (×20): 360 mL
  Filled 2012-09-16 (×28): qty 474

## 2012-09-16 MED ORDER — POTASSIUM CHLORIDE CRYS ER 10 MEQ PO TBCR
10.0000 meq | EXTENDED_RELEASE_TABLET | Freq: Two times a day (BID) | ORAL | Status: DC
  Filled 2012-09-16 (×3): qty 1

## 2012-09-16 MED ORDER — OSMOLITE 1.5 CAL PO LIQD
237.0000 mL | ORAL | Status: DC
  Administered 2012-09-16 – 2012-09-26 (×20): 237 mL
  Filled 2012-09-16 (×25): qty 237

## 2012-09-16 NOTE — Progress Notes (Signed)
Nursing reports patient reporting dyspnea and concerns regarding worsening of BLE edema.  Exam:  General: anxious appearing, tachypenic without signs of distress Lungs: Diffuse expiratory wheezes worse LUL. Extremities: 2+ edema BLE.  A/P 1. Fluid overload/ CHF hx: Off home dose lasix. Will resume. Will check follow up CXR today to decide on IV v/s Po dose

## 2012-09-16 NOTE — Progress Notes (Signed)
ANTICOAGULATION CONSULT NOTE - Follow Up Consult  Pharmacy Consult for coumadin Indication: atrial fibrillation  Allergies  Allergen Reactions  . Olmesartan Medoxomil Cough    REACTION: ? if cough  . Tetracycline Hcl     Unknown reaction, too long for patient to remember   . Venlafaxine     REACTION: severe dry moouth  . Adhesive (Tape) Rash    Patient Measurements: Weight: 216 lb 7.9 oz (98.2 kg) Heparin Dosing Weight:   Vital Signs: Temp: 98.3 F (36.8 C) (05/08 0514) Temp src: Oral (05/08 0514) BP: 112/48 mmHg (05/08 0514) Pulse Rate: 64 (05/08 0514)  Labs:  Recent Labs  09/14/12 0500 09/15/12 0603 09/16/12 0500  HGB 8.7* 8.5* 8.4*  HCT 26.6* 25.1* 25.3*  PLT 197 216 221  LABPROT 29.8* 33.4* 32.2*  INR 3.03* 3.54* 3.37*  CREATININE 0.51  --   --     The CrCl is unknown because both a height and weight (above a minimum accepted value) are required for this calculation.   Medications:  Scheduled:  . [COMPLETED] amiodarone  200 mg Oral BID   Followed by  . amiodarone  200 mg Oral Daily  . antiseptic oral rinse  15 mL Mouth Rinse BID  . aspirin EC  81 mg Oral q morning - 10a  . buPROPion  150 mg Oral q morning - 10a  . clonazePAM  0.25 mg Oral BID  . dronabinol  2.5 mg Oral BID AC  . escitalopram  20 mg Oral QHS  . feeding supplement (JEVITY 1.2 CAL)  360 mL Per Tube Custom  . free water  120 mL Per Tube Custom  . insulin aspart  0-9 Units Subcutaneous TID WC  . letrozole  2.5 mg Oral Daily  . levothyroxine  125 mcg Oral QAC breakfast  . loperamide  2 mg Oral TID  . metoprolol tartrate  75 mg Oral BID  . multivitamin with minerals  1 tablet Oral Daily  . OLANZapine  2.5 mg Oral QHS  . Warfarin - Pharmacist Dosing Inpatient   Does not apply q1800   Infusions:    Assessment: 57 yo female with hx of afib is currently on supratherapeutic coumadin.  INR down to 3.37 from 3.54. Goal of Therapy:  INR 2-3    Plan:  1) No coumadin tonight 2) INR in  am  Alexa Blish, Tsz-Yin 09/16/2012,8:21 AM

## 2012-09-16 NOTE — Consult Note (Signed)
CARDIOLOGY CONSULT NOTE  Patient ID: KERLINE TRAHAN MRN: [id number], DOB/AGE: January 19, 1956   Admit date: 09/13/2012 Date of Consult: 09/16/2012   Primary Physician: [name]., NP Primary Cardiologist: Katherina Right, MD  Pt. Profile  57 y/o female with h/o breast CA, afib, diast chf, and deconditioning, whom we've been asked to eval 2/2 recurrent volume overload.  Problem List  Past Medical History  Diagnosis Date  . Depression   . DVT (deep venous thrombosis)     hx of on HRT left leg ~2006  . GERD (gastroesophageal reflux disease)   . Hyperlipidemia   . Hypertension   . Hypothyroidism   . PPD positive, treated     rx inh   . OSA on CPAP   . Diabetes mellitus   . 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 Breast cancer     a. 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 DJD (degenerative joint disease) of lumbar spine   . Chronic diastolic CHF (congestive heart failure)     a. 08/2012 Echo: EF 55-60%, no rwma, mod MR.  [name] A-fib     a. on amio for rate control/coumadin.  [name] Physical deconditioning     Past Surgical History  Procedure Laterality Date  . Tubal ligation    . Cholecystectomy    . Foot surgery    . Eye surgery    . Cataract extraction    . Percutaneous liver biopsy    . Breast biopsy      left breast  . Mastectomy modified radical  06/08/2012    Procedure: MASTECTOMY MODIFIED RADICAL;  Surgeon: Mariella Saa, MD;  Location: [facility] OR;  Service: General;  Laterality: Right;  . Portacath placement  06/08/2012    Procedure: INSERTION PORT-A-CATH;  Surgeon: Mariella Saa, MD;  Location: [facility] OR;  Service: General;  Laterality: Left;  . Peg tube placement      Allergies  Allergies  Allergen Reactions  . Olmesartan Medoxomil Cough    REACTION: ? if cough  . Tetracycline Hcl     Unknown reaction, too long for patient to  remember   . Venlafaxine     REACTION: severe dry moouth  . Adhesive (Tape) Rash   HPI   57 y/o female with the above problem list.  She has a h/o breast CA and is s/p R mastectomy in 05/2012 with subsequent chemotherapy.  This was poorly tolerated resulting in discontinuation. Since March, she has been in and out of the hospital, initially related to deconditioning, malnutrition, afib and CHF.  Afib was initially difficult to rate control 2/2 hypotn and she was placed on amiodarone and coumadin with eventual rate control.  She was d/c'd to SNF on 4/28 however was readmitted on 4/29 2/2 ongoing wkns, deconditioning, depression, malnutrition, dehydration, and diarrhea.  She has been hospitalized since, with some improvement, and was transferred to inpt rehab on 5/5, where she has been working with PT/OT.  Pt reports that for at least the past week, she has noted progressively worsening dyspnea.  Since late April, her weight has climbed from ~ 200 lbs to a peak of 223 lbs on 5/7.  Her home dose of lasix has been on hold at least since her readmission on 4/29 in the setting of dehydration.  This AM, she c/o significant dyspnea and was noted to be wheezing.  She was felt to be volume overloaded and was treated with IV lasix.  CXR confirmed CHF and we've been asked to eval.  Currently, she remains dyspneic @ rest with talking, but is otw in no acute distress. She denies chest pain or palpitations but does report orthopnea and edema (worse than usual).  Inpatient Medications  . amiodarone  200 mg Oral Daily  . antiseptic oral rinse  15 mL Mouth Rinse BID  . aspirin EC  81 mg Oral q morning - 10a  . buPROPion  150 mg Oral q morning - 10a  . clonazePAM  0.25 mg Oral BID  . dronabinol  2.5 mg Oral BID AC  . escitalopram  20 mg Oral QHS  . feeding supplement (JEVITY 1.2 CAL)  360 mL Per Tube Custom  . free water  120 mL Per Tube Custom  . insulin aspart  0-9 Units Subcutaneous TID WC  . letrozole  2.5 mg  Oral Daily  . levothyroxine  125 mcg Oral QAC breakfast  . loperamide  2 mg Oral TID  . metoprolol tartrate  75 mg Oral BID  . multivitamin with minerals  1 tablet Oral Daily  . OLANZapine  2.5 mg Oral QHS  . potassium chloride  20 mEq Oral TID  . Warfarin - Pharmacist Dosing Inpatient   Does not apply q1800    Family History Family History  Problem Relation Age of Onset  . Diabetes Mother   . Hypertension Mother   . Arthritis Mother   . Heart disease Mother   . Heart failure Mother   . Other Mother     benign breast mass  . Stroke Father   . Heart disease Father   . Diabetes Paternal Grandmother   . Colon cancer Paternal Grandfather      Social History History   Social History  . Marital Status: Married    Spouse Name: N/A    Number of Children: 2  . Years of Education: N/A   Occupational History  . SECRETARY [employer]   Social History Main Topics  . Smoking status: Former Games developer  . Smokeless tobacco: Never Used  . Alcohol Use: No  . Drug Use: No  . Sexually Active: No   Other Topics Concern  . 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     Review of Systems  General:  No chills, fever, night sweats or weight changes.  Cardiovascular:  No chest pain, +++ dyspnea on exertion, edema, orthopnea, no palpitations, paroxysmal nocturnal dyspnea. Dermatological: No rash, lesions/masses Respiratory: No cough, +++ dyspnea as above. Urologic: No hematuria, dysuria Abdominal:   She has had nausea, though and diarrhea though none currently.  No bright red blood per rectum, melena, or hematemesis Neurologic:  No visual changes, wkns, changes in mental status. All other systems reviewed and are otherwise negative except as noted above.  Physical Exam  Blood pressure 131/80, pulse 67,  temperature 98.3 F (36.8 C), temperature source Oral, resp. rate 36, weight 216 lb 7.9 oz (98.2 kg), SpO2 96.00%.  General: Pleasant, dyspneic w/o increased wob. Jaundiced. Psych: flat affect. Neuro: Alert and oriented X 3. Moves all extremities spontaneously. HEENT: Normal  Neck: Supple without bruits  or JVD. Lungs:  Resp regular and unlabored, CTA. Heart: RRR no s3, s4, or murmurs. Abdomen: Soft, non-tender, non-distended, BS + x 4.  Extremities: No clubbing, cyanosis or edema. DP/PT/Radials 2+ and equal bilaterally.  Labs  Lab Results  Component Value Date   WBC 10.1 09/16/2012   HGB 8.4* 09/16/2012   HCT 25.3* 09/16/2012   MCV 85.2 09/16/2012   PLT 221 09/16/2012    Recent Labs Lab 09/14/12 0500  NA 138  K 4.1  CL 106  CO2 23  BUN 14  CREATININE 0.51  CALCIUM 7.2*  PROT 5.4*  BILITOT 0.4  ALKPHOS 192*  ALT 28  AST 39*  GLUCOSE 105*   Lab Results  Component Value Date   CHOL 168 10/14/2011   HDL 48.70 10/14/2011   LDLCALC 101* 10/14/2011   TRIG 93.0 10/14/2011   Radiology/Studies  Dg Chest 2 View  09/16/2012  *RADIOLOGY REPORT*  Clinical Data: Shortness of breath.  Prior history of CHF.  CHEST - 2 VIEW  Comparison: Portable chest x-rays 08/26/2012, 08/25/2012.  Two-view chest x-ray 08/24/2012, 08/03/2012, 11/07/2008.  Findings: Suboptimal inspiration accounts for crowded bronchovascular markings, especially in the lung bases, and accentuates the cardiac silhouette.  Taking this into account, cardiac silhouette moderately enlarged but stable.  Severe diffuse interstitial and airspace pulmonary edema. Moderate sized bilateral pleural effusions.  Left subclavian Port-A-Cath tip remains in the SVC.  IMPRESSION: Severe CHF and/or fluid overload, with severe interstitial and airspace pulmonary edema and moderate sized bilateral pleural effusions.   Original Report Authenticated By: Hulan Saas, M.D.    ECG  Pending  ASSESSMENT AND PLAN  1.  Acute on chronic diastolic chf:  Pt  with massive volume overload with a 1+ wk h/o progressive dyspnea and documentation of relatively steady weight gain dating back to late April.  We will aggressively diurese with 80mg  of IV lasix q 8h.  HR/BP controlled.  She sounds regular on exam, will check ecg now.  F/u bmet in AM.  We talked with nutrition in and have recommended reducing her sodium content and concentrate jevity as much as feasible.  2.  Afib:  Regular on exam.  Check ecg now.  Cont amio 200mg  daily, bb, coumadin.  3.  HTN:  Stable.  4.  DM:  Per IM.  5.  Deconditioning:  PT/OT as tolerated.  6. Breast CA:  Per onc.  Signed, Nicolasa Ducking, NP 09/16/2012, 2:40 PM   History and all data above reviewed.  Patient examined.  I agree with the findings as above.  The patient has had slow weight gain and increasing dyspnea since discharge from [facility].  Her weight is up as above.  She appears to be in sinus rhythm.  The patient exam reveals COR:RRR  ,  Lungs: Crackles 2/3 up ,  Abd: Distended with probable hepatomegaly, Ext Severe dependent edema  .  All available labs, radiology testing, previous records reviewed. Agree with documented assessment and plan. We will give IV Lasix as above.   We have talked with the dietician to lower the free water content and possibly sodium in her tube feedings.  She has just had her dose of amiodarone reduced and I will order a TSH.  We will follow closely with you.    Fayrene Fearing Tramaine Sauls  4:19 PM  09/16/2012

## 2012-09-16 NOTE — Progress Notes (Signed)
Subjective/Complaints: Klonopin helpful for anxiety. Still no appetite A 12 point review of systems has been performed and if not noted above is otherwise negative.   Objective: Vital Signs: Blood pressure 112/48, pulse 64, temperature 98.3 F (36.8 C), temperature source Oral, resp. rate 20, weight 98.2 kg (216 lb 7.9 oz), SpO2 92.00%. No results found.  Recent Labs  09/15/12 0603 09/16/12 0500  WBC 9.1 10.1  HGB 8.5* 8.4*  HCT 25.1* 25.3*  PLT 216 221    Recent Labs  09/14/12 0500  NA 138  K 4.1  CL 106  GLUCOSE 105*  BUN 14  CREATININE 0.51  CALCIUM 7.2*   CBG (last 3)   Recent Labs  09/15/12 1136 09/15/12 2026 09/16/12 0653  GLUCAP 124* 123* 96    Wt Readings from Last 3 Encounters:  09/16/12 98.2 kg (216 lb 7.9 oz)  09/08/12 97.977 kg (216 lb)  09/06/12 97.6 kg (215 lb 2.7 oz)    Physical Exam:  Constitutional: She is oriented to person, place, and time. She appears well-developed. Appears fatigued. obese  HENT: oral mucosa dry. Crusted/blood at corners of mouth. Dry blood on cover. Head: Normocephalic and atraumatic.  Eyes: Pupils are equal, round, and reactive to light.  Neck: Normal range of motion.  Cardiovascular: Normal rate and regular rhythm. No murmurs  Pulmonary/Chest: Effort normal and breath sounds normal. Bilateral mastectomy scars  Abdominal: Soft. Bowel sounds are normal. She exhibits no distension. There is no tenderness.  PEG in place without drainage.  Musculoskeletal: She exhibits edema (BLE with 1+ edema.).  Foam dressing bilateral feet. --no break down.  Neurological: She is alert and oriented to person, place, and time.    Cognitively with basic insight and awareness. mild STM deficits. Motor: 4/5 in bilateral deltoid, biceps, triceps3 minus/5 in bilateral hip flexor knee extensor ankle dorsiflexor and plantar flexor  Sensory to light touch is intact in the upper and lower limbs  Speech is clear. No language deficits Psych:  pleasant but anxious.     Assessment/Plan: 1. Functional deficits secondary to deconditioning/FTT after breast cancer and associate complications  which require 3+ hours per day of interdisciplinary therapy in a comprehensive inpatient rehab setting. Physiatrist is providing close team supervision and 24 hour management of active medical problems listed below. Physiatrist and rehab team continue to assess barriers to discharge/monitor patient progress toward functional and medical goals. FIM: FIM - Bathing Bathing Steps Patient Completed: Chest;Right Arm;Left Arm;Abdomen Bathing: 5: Supervision: Safety issues/verbal cues (Pt did not attempt washing LB)  FIM - Upper Body Dressing/Undressing Upper body dressing/undressing steps patient completed: Thread/unthread right sleeve of pullover shirt/dresss;Thread/unthread left sleeve of pullover shirt/dress;Put head through opening of pull over shirt/dress;Pull shirt over trunk Upper body dressing/undressing: 5: Set-up assist to: Obtain clothing/put away FIM - Lower Body Dressing/Undressing Lower body dressing/undressing steps patient completed: Don/Doff right sock;Don/Doff left sock Lower body dressing/undressing: 5: Set-up assist to: Obtain clothing (with use of sockaide and reacher)  FIM - Toileting Toileting: 0: Activity did not occur  FIM - Diplomatic Services operational officer Devices: Elevated toilet seat;Grab bars Toilet Transfers: 3-To toilet/BSC: Mod A (lift or lower assist);3-From toilet/BSC: Mod A (lift or lower assist)  FIM - Banker Devices: Walker;Arm rests Bed/Chair Transfer: 3: Chair or W/C > Bed: Mod A (lift or lower assist)  FIM - Locomotion: Wheelchair Locomotion: Wheelchair: 1: Total Assistance/staff pushes wheelchair (Pt<25%) FIM - Locomotion: Ambulation Locomotion: Ambulation Assistive Devices: Designer, industrial/product Ambulation/Gait Assistance: 4: Min  assist Locomotion:  Ambulation: 1: Travels less than 50 ft with minimal assistance (Pt.>75%)  Comprehension Comprehension Mode: Auditory Comprehension: 5-Understands complex 90% of the time/Cues < 10% of the time  Expression Expression Mode: Verbal Expression: 5-Expresses complex 90% of the time/cues < 10% of the time  Social Interaction Social Interaction: 5-Interacts appropriately 90% of the time - Needs monitoring or encouragement for participation or interaction.  Problem Solving Problem Solving: 5-Solves basic problems: With no assist  Memory Memory: 5-Recognizes or recalls 90% of the time/requires cueing < 10% of the time  Medical Problem List and Plan:  1. DVT Prophylaxis/Anticoagulation: On coumadin for atrial fibrillation.  2. Pain Management: Morphine prn  3. Depression: Team to provide ego support. To continue Wellbutrin, Lexapro and Zyprexa. Will have LCSW follow for support. neuropsych eval pending   -klonopin helpful yesterday for anxiety 4. Neuropsych: This patient is capable of making decisions on her own behalf.  5. HTN: Monitor on bid basis. Will check daily weights to monitor for fluid overload issues. Resume Lasix and/or Cardizem as indicated.  6. OSA: continue CPAP at nights.  7. Cirrhosis of liver-autoimmune?:  8. Atrial Fibrillation with RVR: Will continue to monitor HR on bid basis. Has been off Cardizem. Continue metoprolol. Will taper amiodarone to 200mg  daily per prior cardiology note. May help with GI symptoms.  9.Anemia: continues to drift down  -with continued drop, have to consider the risk/benefits of coumadin  -still no follow up guaiac sample 10. DM type 2-diet controlled: Regular diet for now to help with food choices and appetite. Continue TF 6 X day and monitor intake. Will have RD adjusting rate/ timing to help with po's. Continue CBG checks ac/hs with SSI to help with elevated BS.  11. Breast cancer: On Femara. For XRT once stronger. Patient to follow up with Dr.  Michell Heinrich aftert discharge 12. Diarrhea:  -likely TF related.  -no more stool since loose stool on 5/5  -schedule imodium in addition to prn doses 13. FEN-   -TF as above  -add marinol for appetite. (has used megace unsuccessfully in the past)  LOS (Days) 3 A FACE TO FACE EVALUATION WAS PERFORMED  Maksymilian Mabey T 09/16/2012 7:59 AM

## 2012-09-16 NOTE — Progress Notes (Signed)
Recreational Therapy Session Note  Patient Details  Name: [name] MRN: [id number] Date of Birth: [birth year] Today's Date: 09/16/2012 Time:  0865-7846 Pain: no c/o Skilled Therapeutic Interventions/Progress Updates: initiated eval today-leisure interest screen started.  Pt seen at bedside, anxious about this mornings xray results.  RN made aware.  Pt expressed interest in relaxation interventions including aromatherapy.  Will follow up early next to complete eval with tx recommendations.  Diversional activity of choice provided in room for use PRN>  Therapy/Group: Individual Therapy  Brettney Ficken 09/16/2012, 1:41 PM

## 2012-09-16 NOTE — Progress Notes (Signed)
Physical Therapy Session Note  Patient Details  Name: [name] MRN: [id number] Date of Birth: 1956-03-17  Today's Date: 09/16/2012 Time: 0930-1000 Time Calculation (min): 30 min  Short Term Goals: Week 1:  PT Short Term Goal 1 (Week 1): Pt will be able to complete bed mobility with mod A using adaptive equipment if needed PT Short Term Goal 2 (Week 1): Pt will be able to complete sit to stands consistently with min A PT Short Term Goal 3 (Week 1): Pt will be able to gait x 50' with min A PT Short Term Goal 4 (Week 1): Pt will be able to go up/down 2 steps for home entry with mod A  Skilled Therapeutic Interventions/Progress Updates:    Denies pain. Focused session on sit to stands (mod A), gait with RW x 30' x 2 with seated rest break and min A (Cues for posture and step length), and LE seated therex during rest breaks (completed 2 sets of 10 reps of seated marches, heel/toe raises, and LAQ). Pt continues with increased anxiety requiring encouragement from therapist throughout entire session.   Therapy Documentation Precautions:  Precautions Precautions: Fall Precaution Comments: Monitor HR Restrictions Weight Bearing Restrictions: No      See FIM for current functional status  Therapy/Group: Individual Therapy  [name] [facility] 09/16/2012, 10:08 AM

## 2012-09-16 NOTE — Treatment Plan (Signed)
Called to pt room because sats of 87% on CPap with 2 lt bleed in. Pt on nasal home cpap. Dr called and was tolt of situation. Dr ordered full face mask on pt cpap. Pt stated she could not tol. Pt placed on 50% VM. sats returned to 94%. Will monotor

## 2012-09-16 NOTE — Progress Notes (Signed)
Follow up CXR with severe CHF. Will treat with IV lasix bid today. Check follow up CXR and lytes in am. Monitor for improvement to decide on need to continue IV lasix tomorrow.

## 2012-09-16 NOTE — Plan of Care (Signed)
Problem: RH BLADDER ELIMINATION Goal: RH STG MANAGE BLADDER WITH EQUIPMENT WITH ASSISTANCE STG Manage Bladder With Equipment With Assistance . Mod I  Outcome: Not Progressing Foley still intact

## 2012-09-16 NOTE — Progress Notes (Signed)
Physical Therapy Note  Patient Details  Name: [name] MRN: [id number] Date of Birth: 10-21-1955 Today's Date: 09/16/2012  Pt missed 30 min of skilled PT due to being on hold for medical issues. Will resume when orders permit.   [name] [facility] 09/16/2012, 3:50 PM

## 2012-09-16 NOTE — Progress Notes (Addendum)
NUTRITION FOLLOW UP  DOCUMENTATION CODES  Per approved criteria   -Severe malnutrition in the context of chronic illness    Intervention:   1. Change TF formula based on conversation with cardiology to more fluid-restricted formula. Change to Osmolite 1.5. Provide 360 ml Osmolite 1.5 BID and 240 ml Osmolite 1.5 BID. This will be a total of 5 cans daily. Total regimen will provide: 1775 kcal, 75 grams protein, 905 ml free water, 1650 mg of sodium. 2. Discussed need for fluid restriction with nursing staff 3. RD to continue to follow nutrition care plan  Nutrition Dx:   Inadequate oral intake related to poor appetite as evidenced by pt with PEG and TF as main source of nutrition.   Goal:   Intake to meet at least 90% of estimated needs.  Monitor:   weight trends, lab trends, I/O's, PO intake, TF tolerance  Assessment:   Pt with hx of metastatic breast CA s/p right mastectomy, DM, HTN with progressively worsening weakness since diagnosis and treatment of CA in January 2014. Admitted to rehab with deconditioning d/t multiple medical issues. Pt with history of severe d/v/n, very limited PO intake. Pt with no desire to eat x 2 months. Pt had PEG placed during previous admission and pt was started on continuous infusion of Jevity 1.2 then switched to bolus TF. Pt was getting of Jevity 1.2 via PEG 4 times/day PTA which provides 1728 calories, 80g protein, free water. Pt reports she was tolerating this TF well at home without n/v.  Current regimen is 360 ml Jevity 1.2 QID. 60 ml free water flush before and after each feeding. This regimen will provide: 1728 kcal, 81 grams protein, 1642 ml free water.  Working towards d/c date of 5/20. MD added marinol 5/8 for appetite (pt has tried megace in the past.) Pt noted to have dyspnea and worsening BLE edema this morning. Work-up reveals fluid overload/CHF; lasix resumed. RD discussed oral intake with patient - she reports that she cannot and is  not trying to eat anything.  Discussed conversation with cardiology with PA Pam Love. PA has discontinued free water flushes and added fluid restriction to diet order.   Pt meets criteria for SEVERE MALNUTRITION in the context of chronic as evidenced by <75% of estimated energy requirements for 2 months and edema.  Height: Ht Readings from Last 1 Encounters:  09/08/12 5\' 4"  (1.626 m)    Weight Status:   Wt Readings from Last 1 Encounters:  09/16/12 216 lb 7.9 oz (98.2 kg)  Wt is stable.  Re-estimated needs:  Kcal: 1650 - 1950 Protein: 85 - 100 grams Fluid: 1.8 - 2 liters daily  Skin: stage II on L and R buttocks  Diet Order: General   Intake/Output Summary (Last 24 hours) at 09/16/12 1215 Last data filed at 09/16/12 0624  Gross per 24 hour  Intake   1060 ml  Output   1350 ml  Net   -290 ml    Last BM: 5/7   Labs:   Recent Labs Lab 09/14/12 0500  NA 138  K 4.1  CL 106  CO2 23  BUN 14  CREATININE 0.51  CALCIUM 7.2*  GLUCOSE 105*    CBG (last 3)   Recent Labs  09/15/12 2026 09/16/12 0653 09/16/12 1135  GLUCAP 123* 96 108*    Scheduled Meds: . amiodarone  200 mg Oral Daily  . antiseptic oral rinse  15 mL Mouth Rinse BID  . aspirin EC  81 mg Oral q morning - 10a  . buPROPion  150 mg Oral q morning - 10a  . clonazePAM  0.25 mg Oral BID  . dronabinol  2.5 mg Oral BID AC  . escitalopram  20 mg Oral QHS  . feeding supplement (JEVITY 1.2 CAL)  360 mL Per Tube Custom  . free water  120 mL Per Tube Custom  . furosemide  40 mg Intravenous Once  . insulin aspart  0-9 Units Subcutaneous TID WC  . letrozole  2.5 mg Oral Daily  . levothyroxine  125 mcg Oral QAC breakfast  . loperamide  2 mg Oral TID  . metoprolol tartrate  75 mg Oral BID  . multivitamin with minerals  1 tablet Oral Daily  . OLANZapine  2.5 mg Oral QHS  . potassium chloride  20 mEq Oral TID  . Warfarin - Pharmacist Dosing Inpatient   Does not apply q1800    Continuous Infusions:   none  Jarold Motto MS, RD, LDN Pager: [phone] After-hours pager: [phone]

## 2012-09-16 NOTE — Progress Notes (Signed)
Occupational Therapy Session Notes  Patient Details  Name: [name]  MRN: [id number] Date of Birth: 05-22-1955  Today's Date: 09/16/2012  Short Term Goals: Week 1:  OT Short Term Goal 1 (Week 1): Patient will perform toilet transfer with moderate assistance using DME & AD prn OT Short Term Goal 2 (Week 1): Patient will bathe 10/10 body parts with moderate assistance in sit<>stand position using AE prn OT Short Term Goal 3 (Week 1): Patient will perform LB dsg (pants only) with minimal assistance in sit<>stand position OT Short Term Goal 4 (Week 1): Patient will donn shirt with minimal assistance, seated un-supported OT Short Term Goal 5 (Week 1): AE for LB ADLs will be introduced to patient  Skilled Therapeutic Interventions/Progress Updates:   Session #1 4098-1191 - 55 Minutes Individual Therapy No complaints of pain Patient found supine in bed. Therapist donned thigh-high TEDs while patient supine in bed. Patient engaged in bed mobility with minimal assistance, then performed stand pivot transfer into w/c with moderate assistance using rolling walker. Patient then sat at sink for UB bathing & dressing, as well as grooming tasks. After UB ADL therapist propelled patient into bathroom for toilet transfer & toileting. After toileting, patient performed LB bathing & dressing in sit<>stand position at sink. Patient with increased anxiety and decreased dynamic standing balance/tolerance/endurance. At end of session, left patient seated in w/c with BLEs elevated and nurse in room. Husband present during entire session.   Session #2 Patient missed 30 minutes of skilled occupational therapy secondary to MD hold secondary to recent diagnosis of severe CHF.   Precautions:  Precautions Precautions: Fall Precaution Comments: Monitor HR Restrictions Weight Bearing Restrictions: No  See FIM for current functional status  Asuncion Tapscott 09/16/2012, 7:15 AM

## 2012-09-16 NOTE — Progress Notes (Signed)
Physical Therapy Note  Patient Details  Name: [name] MRN: [id number] Date of Birth: [birth year] Today's Date: 09/16/2012  Patient missed 45 minutes skilled physical therapy this PM secondary to medical hold. Will follow up as able.   Zella Richer Terral Cooks S. Hadas Jessop, PT, DPT 09/16/2012, 1:19 PM

## 2012-09-17 ENCOUNTER — Inpatient Hospital Stay (HOSPITAL_COMMUNITY): Payer: 59

## 2012-09-17 ENCOUNTER — Inpatient Hospital Stay (HOSPITAL_COMMUNITY): Payer: 59 | Admitting: Occupational Therapy

## 2012-09-17 DIAGNOSIS — E032 Hypothyroidism due to medicaments and other exogenous substances: Secondary | ICD-10-CM

## 2012-09-17 LAB — URINALYSIS, ROUTINE W REFLEX MICROSCOPIC
Glucose, UA: NEGATIVE mg/dL
Ketones, ur: NEGATIVE mg/dL
Protein, ur: NEGATIVE mg/dL

## 2012-09-17 LAB — BASIC METABOLIC PANEL
BUN: 14 mg/dL (ref 6–23)
CO2: 27 mEq/L (ref 19–32)
Calcium: 7.7 mg/dL — ABNORMAL LOW (ref 8.4–10.5)
Glucose, Bld: 86 mg/dL (ref 70–99)
Sodium: 140 mEq/L (ref 135–145)

## 2012-09-17 LAB — GLUCOSE, CAPILLARY
Glucose-Capillary: 124 mg/dL — ABNORMAL HIGH (ref 70–99)
Glucose-Capillary: 112 mg/dL — ABNORMAL HIGH (ref 70–99)

## 2012-09-17 MED ORDER — WARFARIN SODIUM 2 MG PO TABS
2.0000 mg | ORAL_TABLET | Freq: Once | ORAL | Status: AC
  Administered 2012-09-17: 2 mg via ORAL
  Filled 2012-09-17: qty 1

## 2012-09-17 MED ORDER — POTASSIUM CHLORIDE CRYS ER 20 MEQ PO TBCR
30.0000 meq | EXTENDED_RELEASE_TABLET | Freq: Three times a day (TID) | ORAL | Status: DC
  Administered 2012-09-17 – 2012-09-18 (×6): 30 meq via ORAL
  Filled 2012-09-17 (×11): qty 1

## 2012-09-17 NOTE — Progress Notes (Signed)
ANTICOAGULATION CONSULT NOTE - Follow Up Consult  Pharmacy Consult for coumadin Indication: atrial fibrillation  Allergies  Allergen Reactions  . Olmesartan Medoxomil Cough    REACTION: ? if cough  . Tetracycline Hcl     Unknown reaction, too long for patient to remember   . Venlafaxine     REACTION: severe dry moouth  . Adhesive (Tape) Rash    Patient Measurements: Weight: 220 lb 0.3 oz (99.8 kg) Heparin Dosing Weight:   Vital Signs: Temp: 97.8 F (36.6 C) (05/09 0500) Temp src: Oral (05/09 0500) BP: 111/69 mmHg (05/09 0500) Pulse Rate: 64 (05/09 0500)  Labs:  Recent Labs  09/15/12 0603 09/16/12 0500 09/17/12 0500  HGB 8.5* 8.4*  --   HCT 25.1* 25.3*  --   PLT 216 221  --   LABPROT 33.4* 32.2* 27.7*  INR 3.54* 3.37* 2.75*  CREATININE  --   --  0.53    The CrCl is unknown because both a height and weight (above a minimum accepted value) are required for this calculation.   Medications:  Scheduled:  . antiseptic oral rinse  15 mL Mouth Rinse BID  . aspirin EC  81 mg Oral q morning - 10a  . buPROPion  150 mg Oral q morning - 10a  . clonazePAM  0.25 mg Oral BID  . dronabinol  2.5 mg Oral BID AC  . escitalopram  20 mg Oral QHS  . feeding supplement (OSMOLITE 1.5 CAL)  237 mL Per Tube Custom  . feeding supplement (OSMOLITE 1.5 CAL)  360 mL Per Tube BID WC  . [COMPLETED] furosemide  60 mg Intravenous Once  . furosemide  80 mg Intravenous Q8H  . insulin aspart  0-9 Units Subcutaneous TID WC  . letrozole  2.5 mg Oral Daily  . levothyroxine  125 mcg Oral QAC breakfast  . loperamide  2 mg Oral TID  . metoprolol tartrate  75 mg Oral BID  . multivitamin with minerals  1 tablet Oral Daily  . OLANZapine  2.5 mg Oral QHS  . potassium chloride  30 mEq Oral TID  . Warfarin - Pharmacist Dosing Inpatient   Does not apply q1800  . [DISCONTINUED] amiodarone  200 mg Oral Daily  . [DISCONTINUED] amiodarone  200 mg Oral Daily  . [DISCONTINUED] amiodarone  200 mg Oral Daily   . [DISCONTINUED] feeding supplement (JEVITY 1.2 CAL)  360 mL Per Tube Custom  . [DISCONTINUED] free water  120 mL Per Tube Custom  . [DISCONTINUED] furosemide  40 mg Intravenous Once  . [DISCONTINUED] furosemide  20 mg Oral BID  . [DISCONTINUED] potassium chloride  10 mEq Oral BID  . [DISCONTINUED] potassium chloride  20 mEq Oral BID  . [DISCONTINUED] potassium chloride  20 mEq Oral TID   Infusions:    Assessment: 57 yo female with history of afib is now on therapeutic coumadin.  INR is down to 2.75 and amiodarone is discontinued. Goal of Therapy:  INR 2-3    Plan:  1) Coumadin 2mg  pox 1 2) INR in am  Aurel Nguyen, Tsz-Yin 09/17/2012,8:25 AM

## 2012-09-17 NOTE — Progress Notes (Signed)
Physical Therapy Session Note  Patient Details  Name: [name] MRN: [id number] Date of Birth: December 10, 1955  Today's Date: 09/17/2012 Time: 4098-1191 Time Calculation (min): 25 min  Short Term Goals: Week 1:  PT Short Term Goal 1 (Week 1): Pt will be able to complete bed mobility with mod A using adaptive equipment if needed PT Short Term Goal 2 (Week 1): Pt will be able to complete sit to stands consistently with min A PT Short Term Goal 3 (Week 1): Pt will be able to gait x 50' with min A PT Short Term Goal 4 (Week 1): Pt will be able to go up/down 2 steps for home entry with mod A  Skilled Therapeutic Interventions/Progress Updates:    Pt still up in recliner, reports that she is too tired to do much but willing to try transferring back to bed. Focused on sit to stand from recliner (3 attempts needed with cues to decrease anxiety, proper foot and hand placement) with step transfer back to bed. Required mod A to return to supine and assisted with repositioning and lifting LE's to place pillows underneath. Multiple rest breaks needed due to fatigue and only able to tolerate 25 min of session.  Encouraged pt to try to get OOB later this evening or at least sit up EOB with pt in agreement.   Therapy Documentation Precautions:  Precautions Precautions: Fall Precaution Comments: Monitor HR Restrictions Weight Bearing Restrictions: No General: Amount of Missed PT Time (min): 35 Minutes Missed Time Reason: Patient fatigue  Pain:  Denies pain. States that if she starts talking, she starts coughing and can't breathe.   See FIM for current functional status  Therapy/Group: Individual Therapy  [name] [facility] 09/17/2012, 3:51 PM

## 2012-09-17 NOTE — Progress Notes (Signed)
Occupational Therapy Session Note  Patient Details  Name: [name] MRN: [id number] Date of Birth: 1955-10-06  Today's Date: 09/17/2012 Time: 0830-0925 Time Calculation (min): 55 min  Short Term Goals: Week 1:  OT Short Term Goal 1 (Week 1): Patient will perform toilet transfer with moderate assistance using DME & AD prn OT Short Term Goal 2 (Week 1): Patient will bathe 10/10 body parts with moderate assistance in sit<>stand position using AE prn OT Short Term Goal 3 (Week 1): Patient will perform LB dsg (pants only) with minimal assistance in sit<>stand position OT Short Term Goal 4 (Week 1): Patient will donn shirt with minimal assistance, seated un-supported OT Short Term Goal 5 (Week 1): AE for LB ADLs will be introduced to patient  Skilled Therapeutic Interventions/Progress Updates:  Patient found supine in bed. Patient willing and eager to engage in therapy today. Therapist donned bilateral TEDs then patient engaged in bed mobility and sat edge of bed with min assist with HOB elevated and bed rails. Patient then stood with rolling walker and took a few steps -> recliner. Once seated in recliner, patient engaged in BLE exercises to help decrease edema. After exercises patient left seated in recliner with BLEs elevated. Patient able to tolerate therapy session, but required multiple rest breaks. Therapist administered stress ball for hand pumping exercises in order to help decrease edema. Husband and RN present in room at end of session.   Precautions:  Precautions Precautions: Fall Precaution Comments: Monitor HR Restrictions Weight Bearing Restrictions: No  See FIM for current functional status  Therapy/Group: Individual Therapy  Derotha Fishbaugh 09/17/2012, 9:33 AM

## 2012-09-17 NOTE — Progress Notes (Signed)
Patient blood pressure was 106/64 at 2207. Patient had orders for 80 mg of lasix. Marissa Nestle ,PA notified orders given to give the lasix. Patient  Blood pressure rechecked 117/65

## 2012-09-17 NOTE — Progress Notes (Addendum)
Subjective/Complaints: Breathing a little better this am. NRB mask is on A 12 point review of systems has been performed and if not noted above is otherwise negative.   Objective: Vital Signs: Blood pressure 111/69, pulse 64, temperature 97.8 F (36.6 C), temperature source Oral, resp. rate 22, weight 99.8 kg (220 lb 0.3 oz), SpO2 95.00%. Dg Chest 2 View  09/17/2012  *RADIOLOGY REPORT*  Clinical Data: Congestive heart failure.  Shortness breath. Weakness.  Cirrhosis.  Breast carcinoma.  CHEST - 2 VIEW  Comparison: 09/16/2012  Findings: Bilateral pulmonary air space disease shows no significant interval change.  Cardiomegaly remains stable. Left- sided Port-A-Cath remains in appropriate position.  IMPRESSION: Stable bilateral pulmonary air space disease and cardiomegaly.   Original Report Authenticated By: Myles Rosenthal, M.D.    Dg Chest 2 View  09/16/2012  *RADIOLOGY REPORT*  Clinical Data: Shortness of breath.  Prior history of CHF.  CHEST - 2 VIEW  Comparison: Portable chest x-rays 08/26/2012, 08/25/2012.  Two-view chest x-ray 08/24/2012, 08/03/2012, 11/07/2008.  Findings: Suboptimal inspiration accounts for crowded bronchovascular markings, especially in the lung bases, and accentuates the cardiac silhouette.  Taking this into account, cardiac silhouette moderately enlarged but stable.  Severe diffuse interstitial and airspace pulmonary edema. Moderate sized bilateral pleural effusions.  Left subclavian Port-A-Cath tip remains in the SVC.  IMPRESSION: Severe CHF and/or fluid overload, with severe interstitial and airspace pulmonary edema and moderate sized bilateral pleural effusions.   Original Report Authenticated By: Hulan Saas, M.D.     Recent Labs  09/15/12 0603 09/16/12 0500  WBC 9.1 10.1  HGB 8.5* 8.4*  HCT 25.1* 25.3*  PLT 216 221    Recent Labs  09/17/12 0500  NA 140  K 3.5  CL 107  GLUCOSE 86  BUN 14  CREATININE 0.53  CALCIUM 7.7*   CBG (last 3)   Recent Labs   09/16/12 1621 09/16/12 2158 09/17/12 0725  GLUCAP 111* 87 84    Wt Readings from Last 3 Encounters:  09/17/12 99.8 kg (220 lb 0.3 oz)  09/08/12 97.977 kg (216 lb)  09/06/12 97.6 kg (215 lb 2.7 oz)    Physical Exam:  Constitutional: She is oriented to person, place, and time. She appears well-developed. Appears fatigued. obese  HENT: oral mucosa dry. Crusted/blood at corners of mouth. Dry blood on cover. Head: Normocephalic and atraumatic.  Eyes: Pupils are equal, round, and reactive to light.  Neck: Normal range of motion.  Cardiovascular: Normal rate and regular rhythm. No murmurs  Pulmonary/Chest: sob. Rales bilaterally Abdominal: Soft. Bowel sounds are normal. She exhibits no distension. There is no tenderness.  PEG in place without drainage.  Musculoskeletal: She exhibits edema (BLE with 2+ edema.).  Foam dressing bilateral feet. --no break down.  Neurological: She is alert and oriented to person, place, and time.    Cognitively with basic insight and awareness. mild STM deficits. Motor: 4/5 in bilateral deltoid, biceps, triceps3 minus/5 in bilateral hip flexor knee extensor ankle dorsiflexor and plantar flexor  Sensory to light touch is intact in the upper and lower limbs  Speech is clear. No language deficits Psych: pleasant but anxious.     Assessment/Plan: 1. Functional deficits secondary to deconditioning/FTT after breast cancer and associate complications  which require 3+ hours per day of interdisciplinary therapy in a comprehensive inpatient rehab setting. Physiatrist is providing close team supervision and 24 hour management of active medical problems listed below. Physiatrist and rehab team continue to assess barriers to discharge/monitor patient progress toward functional  and medical goals.  Can participate in therapy to tolerance  FIM: FIM - Bathing Bathing Steps Patient Completed: Chest;Right Arm;Left Arm;Abdomen;Front perineal area;Right upper leg;Left  upper leg Bathing: 3: Mod-Patient completes 5-7 72f 10 parts or 50-74%  FIM - Upper Body Dressing/Undressing Upper body dressing/undressing steps patient completed: Thread/unthread right sleeve of pullover shirt/dresss;Thread/unthread left sleeve of pullover shirt/dress;Put head through opening of pull over shirt/dress;Pull shirt over trunk Upper body dressing/undressing: 5: Set-up assist to: Obtain clothing/put away FIM - Lower Body Dressing/Undressing Lower body dressing/undressing steps patient completed: Don/Doff right sock;Don/Doff left sock Lower body dressing/undressing: 1: Total-Patient completed less than 25% of tasks  FIM - Toileting Toileting steps completed by patient: Adjust clothing prior to toileting Toileting: 2: Max-Patient completed 1 of 3 steps  FIM - Diplomatic Services operational officer Devices: Elevated toilet seat;Grab bars Toilet Transfers: 3-To toilet/BSC: Mod A (lift or lower assist);3-From toilet/BSC: Mod A (lift or lower assist)  FIM - Bed/Chair Transfer Bed/Chair Transfer Assistive Devices: Therapist, occupational: 0: Activity did not occur  FIM - Locomotion: Wheelchair Locomotion: Wheelchair: 0: Activity did not occur FIM - Locomotion: Ambulation Locomotion: Ambulation Assistive Devices: Designer, industrial/product Ambulation/Gait Assistance: Not tested (comment) Locomotion: Ambulation: 0: Activity did not occur  Comprehension Comprehension Mode: Auditory Comprehension: 5-Understands complex 90% of the time/Cues < 10% of the time  Expression Expression Mode: Verbal Expression: 5-Expresses complex 90% of the time/cues < 10% of the time  Social Interaction Social Interaction: 5-Interacts appropriately 90% of the time - Needs monitoring or encouragement for participation or interaction.  Problem Solving Problem Solving: 5-Solves basic problems: With no assist  Memory Memory: 5-Recognizes or recalls 90% of the time/requires cueing < 10% of the  time  Medical Problem List and Plan:  1. DVT Prophylaxis/Anticoagulation: On coumadin for atrial fibrillation.  2. Pain Management: Morphine prn  3. Depression: Team to provide ego support. To continue Wellbutrin, Lexapro and Zyprexa. Will have LCSW follow for support. neuropsych eval pending   -klonopin helpful yesterday for anxiety 4. Neuropsych: This patient is capable of making decisions on her own behalf.  5. HTN: Monitor on bid basis. Will check daily weights to monitor for fluid overload issues. Resume Lasix and/or Cardizem as indicated.  6. OSA: continue CPAP at nights.  7. Cirrhosis of liver-autoimmune?:  8. Atrial Fibrillation with RVR/CHF: Will continue to monitor HR on bid basis. Has been off Cardizem. Continue metoprolol. Amiodarone per cards  -aggressive diuresis with iv lasix  -foley continues  -cxr follow up  -appreciate cardiology assist  -TSH elevated- will defer mgt to cards  -proteint malnutrition 9.Anemia: continues to drift down  -with continued drop, have to consider the risk/benefits of coumadin  -still no follow up guaiac sample 10. DM type 2-diet controlled: Regular diet for now to help with food choices and appetite. Continue TF 6 X day and monitor intake. Will have RD adjusting rate/ timing to help with po's. Continue CBG checks ac/hs with SSI to help with elevated BS.  11. Breast cancer: On Femara. For XRT once stronger. Patient to follow up with Dr. Michell Heinrich aftert discharge 12. Diarrhea:  -TF related.  -no more stool since loose stool on 5/5  -schedule imodium in addition to prn doses 13. FEN-   -TF as above  -added marinol for appetite. (has used megace unsuccessfully in the past)  LOS (Days) 4 A FACE TO FACE EVALUATION WAS PERFORMED  Trista Ciocca T 09/17/2012 8:29 AM

## 2012-09-17 NOTE — Progress Notes (Signed)
PROGRESS NOTE  Subjective:   [name] is a 57 yo with hx of breast ca, A-fib, diastolic CHF admitted the the inpatient rehab for severe deconditioning.  We were consulted yesterday for volume overload.  She was started on Lasix and has started to diurese well.  Her TSH is elevated - likely due to the amiodarone.   Objective:    Vital Signs:   Temp:  [97.8 F (36.6 C)-98.3 F (36.8 C)] 97.8 F (36.6 C) (05/09 0500) Pulse Rate:  [61-88] 64 (05/09 0500) Resp:  [20-36] 22 (05/09 0500) BP: (102-131)/(61-80) 111/69 mmHg (05/09 0500) SpO2:  [84 %-96 %] 95 % (05/09 0510) FiO2 (%):  [50 %] 50 % (05/09 0510) Weight:  [214 lb 12.8 oz (97.433 kg)-220 lb 0.3 oz (99.8 kg)] 220 lb 0.3 oz (99.8 kg) (05/09 0700)  Last BM Date: 09/15/12   24-hour weight change: Weight change: -1 lb 11.1 oz (-0.767 kg)  Weight trends: Filed Weights   09/16/12 0514 09/17/12 0500 09/17/12 0700  Weight: 216 lb 7.9 oz (98.2 kg) 214 lb 12.8 oz (97.433 kg) 220 lb 0.3 oz (99.8 kg)    Intake/Output:  05/08 0701 - 05/09 0700 In: 717 [P.O.:120] Out: 3500 [Urine:3500]     Physical Exam: BP 111/69  Pulse 64  Temp(Src) 97.8 F (36.6 C) (Oral)  Resp 22  Wt 220 lb 0.3 oz (99.8 kg)  BMI 37.75 kg/m2  SpO2 95%    Patient was off the floor.   Labs: BMET:  Recent Labs  09/17/12 0500  NA 140  K 3.5  CL 107  CO2 27  GLUCOSE 86  BUN 14  CREATININE 0.53  CALCIUM 7.7*    Liver function tests: No results found for this basename: AST, ALT, ALKPHOS, BILITOT, PROT, ALBUMIN,  in the last 72 hours No results found for this basename: LIPASE, AMYLASE,  in the last 72 hours  CBC:  Recent Labs  09/15/12 0603 09/16/12 0500  WBC 9.1 10.1  HGB 8.5* 8.4*  HCT 25.1* 25.3*  MCV 84.8 85.2  PLT 216 221    Cardiac Enzymes: No results found for this basename: CKTOTAL, CKMB, TROPONINI,  in the last 72 hours  Coagulation Studies:  Recent Labs  09/15/12 0603 09/16/12 0500 09/17/12 0500  LABPROT 33.4*  32.2* 27.7*  INR 3.54* 3.37* 2.75*    Other: No components found with this basename: POCBNP,  No results found for this basename: DDIMER,  in the last 72 hours No results found for this basename: HGBA1C,  in the last 72 hours No results found for this basename: CHOL, HDL, LDLCALC, TRIG, CHOLHDL,  in the last 72 hours  Recent Labs  09/16/12 1700  TSH 10.307*   No results found for this basename: VITAMINB12, FOLATE, FERRITIN, TIBC, IRON, RETICCTPCT,  in the last 72 hours   Other results:    Medications:    Infusions:    Scheduled Medications: . amiodarone  200 mg Oral Daily  . antiseptic oral rinse  15 mL Mouth Rinse BID  . aspirin EC  81 mg Oral q morning - 10a  . buPROPion  150 mg Oral q morning - 10a  . clonazePAM  0.25 mg Oral BID  . dronabinol  2.5 mg Oral BID AC  . escitalopram  20 mg Oral QHS  . feeding supplement (OSMOLITE 1.5 CAL)  237 mL Per Tube Custom  . feeding supplement (OSMOLITE 1.5 CAL)  360 mL Per Tube BID WC  . furosemide  80 mg Intravenous  Q8H  . insulin aspart  0-9 Units Subcutaneous TID WC  . letrozole  2.5 mg Oral Daily  . levothyroxine  125 mcg Oral QAC breakfast  . loperamide  2 mg Oral TID  . metoprolol tartrate  75 mg Oral BID  . multivitamin with minerals  1 tablet Oral Daily  . OLANZapine  2.5 mg Oral QHS  . potassium chloride  20 mEq Oral TID  . Warfarin - Pharmacist Dosing Inpatient   Does not apply q1800    Assessment/ Plan:      Physical deconditioning- per rehab team.    Atrial fib-  Unfortunately, we will need to DC the amiodarone.  Her TSH has increased to > 10 over the past several months - I suspect this is due to the amiodarone.  We may try another antiarrhythmic but will see how she does off amiodarone.   Diastolic chf:- diuresing well.  Will continue current Lasix dose.  Could probably decrease to BID in the next day or so.   She is -2.8 liters yesterday.  DIABETES MELLITUS, TYPE II  Anemia:  Her Hb has been falling .   Plan per primary medical team.     Disposition:  Length of Stay: 4  Vesta Mixer, Montez Hageman., MD, [facility] 09/17/2012, 7:51 AM Office [phone] Pager [phone]

## 2012-09-17 NOTE — Progress Notes (Signed)
Occupational Therapy Session Note  Patient Details  Name: [name] MRN: [id number] Date of Birth: 1955/08/05  Today's Date: 09/17/2012 Time: 4098-1191 Time Calculation (min): 27 min  Short Term Goals: Week 1:  OT Short Term Goal 1 (Week 1): Patient will perform toilet transfer with moderate assistance using DME & AD prn OT Short Term Goal 2 (Week 1): Patient will bathe 10/10 body parts with moderate assistance in sit<>stand position using AE prn OT Short Term Goal 3 (Week 1): Patient will perform LB dsg (pants only) with minimal assistance in sit<>stand position OT Short Term Goal 4 (Week 1): Patient will donn shirt with minimal assistance, seated un-supported OT Short Term Goal 5 (Week 1): AE for LB ADLs will be introduced to patient  Skilled Therapeutic Interventions/Progress Updates:    Pt seen for 1:1 OT with focus on overall strengthening and activity tolerance.  Pt up in recliner reporting being very fatigued and willing to attempt seated exercises. Engaged in UE exercises with 2# dowel rod and hand weights performing 2 sets of 10 shoulder flexion, elbow flexion, bicep curls, shoulder press and alternating row.  Verbal and demonstration cues for technique and upright sitting during exercises.  Pt returned to reclined in recliner with legs elevated with husband in room.  Therapy Documentation Precautions:  Precautions Precautions: Fall Precaution Comments: Monitor HR Restrictions Weight Bearing Restrictions: No General: General Missed Time Reason: Patient fatigue Vital Signs: Therapy Vitals Temp: 98.2 F (36.8 C) Temp src: Oral Pulse Rate: 64 Resp: 24 BP: 111/71 mmHg Patient Position, if appropriate: Sitting Oxygen Therapy SpO2: 97 % O2 Device: Nasal cannula O2 Flow Rate (L/min): 4 L/min Pain:  Pt with no c/o pain this session.  See FIM for current functional status  Therapy/Group: Individual Therapy  Leonette Monarch 09/17/2012, 2:34 PM

## 2012-09-17 NOTE — Progress Notes (Signed)
Physical Therapy Session Note  Patient Details  Name: [name] MRN: [id number] Date of Birth: 09/19/1955  Today's Date: 09/17/2012 Time: 4098-1191 Time Calculation (min): 30 min  Short Term Goals: Week 1:  PT Short Term Goal 1 (Week 1): Pt will be able to complete bed mobility with mod A using adaptive equipment if needed PT Short Term Goal 2 (Week 1): Pt will be able to complete sit to stands consistently with min A PT Short Term Goal 3 (Week 1): Pt will be able to gait x 50' with min A PT Short Term Goal 4 (Week 1): Pt will be able to go up/down 2 steps for home entry with mod A  Skilled Therapeutic Interventions/Progress Updates:    Pt up in recliner reporting being very fatigued and feeling like she can't do anything today. Pt agreeable to seated therex and sit to stands for pressure relief, strengthening and overall activity tolerance. Completed seated heel/toe raises, LAQ, and seated marches x 10 reps x 2 sets each and 3 sit to stands with mod A from recliner (with w/c cushion for elevated height) weight shifting and attempted marches in standing. Pt unable to complete session due to fatigue - elevated LE's for edema control and all needs in place.   Therapy Documentation Precautions:  Precautions Precautions: Fall Precaution Comments: Monitor HR Restrictions Weight Bearing Restrictions: No General: Amount of Missed PT Time (min): 15 Minutes Missed Time Reason: Patient fatigue  Pain:  Denies pain but reports fatigue.  See FIM for current functional status  Therapy/Group: Individual Therapy  [name] [facility] 09/17/2012, 11:58 AM

## 2012-09-18 ENCOUNTER — Inpatient Hospital Stay (HOSPITAL_COMMUNITY): Payer: 59 | Admitting: *Deleted

## 2012-09-18 ENCOUNTER — Inpatient Hospital Stay (HOSPITAL_COMMUNITY): Payer: 59

## 2012-09-18 ENCOUNTER — Inpatient Hospital Stay (HOSPITAL_COMMUNITY): Payer: 59 | Admitting: Physical Therapy

## 2012-09-18 DIAGNOSIS — I10 Essential (primary) hypertension: Secondary | ICD-10-CM

## 2012-09-18 DIAGNOSIS — C50919 Malignant neoplasm of unspecified site of unspecified female breast: Secondary | ICD-10-CM

## 2012-09-18 DIAGNOSIS — E119 Type 2 diabetes mellitus without complications: Secondary | ICD-10-CM

## 2012-09-18 DIAGNOSIS — I4891 Unspecified atrial fibrillation: Secondary | ICD-10-CM

## 2012-09-18 DIAGNOSIS — R5381 Other malaise: Secondary | ICD-10-CM

## 2012-09-18 DIAGNOSIS — R197 Diarrhea, unspecified: Secondary | ICD-10-CM

## 2012-09-18 LAB — BASIC METABOLIC PANEL
CO2: 31 mEq/L (ref 19–32)
Chloride: 104 mEq/L (ref 96–112)
Sodium: 140 mEq/L (ref 135–145)

## 2012-09-18 LAB — GLUCOSE, CAPILLARY
Glucose-Capillary: 120 mg/dL — ABNORMAL HIGH (ref 70–99)
Glucose-Capillary: 102 mg/dL — ABNORMAL HIGH (ref 70–99)

## 2012-09-18 MED ORDER — WARFARIN SODIUM 2 MG PO TABS
2.0000 mg | ORAL_TABLET | Freq: Once | ORAL | Status: AC
  Administered 2012-09-18: 2 mg via ORAL
  Filled 2012-09-18: qty 1

## 2012-09-18 MED ORDER — SPIRONOLACTONE 25 MG PO TABS
25.0000 mg | ORAL_TABLET | Freq: Every day | ORAL | Status: DC
  Administered 2012-09-18 – 2012-09-28 (×11): 25 mg via ORAL
  Filled 2012-09-18 (×13): qty 1

## 2012-09-18 NOTE — Progress Notes (Signed)
Occupational Therapy Session Note  Patient Details  Name: [name] MRN: [id number] Date of Birth: [birth year]  Today's Date: 09/18/2012 Time: 4098-1191 Time Calculation (min): 45 min   Skilled Therapeutic Interventions/Progress Updates:    Therex session with focus on Bil UE strength and endurance, standing balance and tolerance and functional transfers. See below for exercises. Patient stood at table top for 1'16" while participating in card match board. vc's required for hand placement and technique during sit to stand. Pt transfer from bed to w/c with RW and Mod assist. Several rest breaks needed. Pt participated in all activities and showed great motivation.   Therapy Documentation Precautions:  Precautions Precautions: Fall Precaution Comments: Monitor HR Restrictions Weight Bearing Restrictions: No Pain: Pain Assessment Pain Assessment: No/denies pain Exercises: General Exercises - Upper Extremity Shoulder Flexion: AROM;Both;15 reps;Seated Shoulder ABduction: AROM;Both;15 reps;Seated Shoulder Horizontal ABduction: Strengthening;Both;15 reps;Seated;Bar weights/barbell Bar Weights/Barbell (Shoulder Horizontal Abduction): 1 lb Elbow Flexion: Strengthening;15 reps;Both;Seated;Bar weights/barbell Bar Weights/Barbell (Elbow Flexion): 1 lb Other Treatments:    See FIM for current functional status  Therapy/Group: Individual Therapy  Limmie Patricia, OTR/L,CBIS   09/18/2012, 2:12 PM

## 2012-09-18 NOTE — Progress Notes (Signed)
Occupational Therapy Note  Patient Details  Name: [name] MRN: [id number] Date of Birth: 04-25-1956 Today's Date: 09/18/2012  Time:  0930-1030  (60 min) Individual session Pain:  3/10 back  Treatment focused on transfers, endurance, activity tolerance, UE AROM, strengthening..  Pt. Utilized SCI FIT for , at 1,0 wkload at 13 RPM.  Took rest break for 3 minutes and then again for 3 minutes.  Pt reported exertion was 13. Oxygen= 94%, HR= 64.   Transferred to nustep with mod assist. And rode for 5 minutes at 3 workload.  Mod assist transfer back to wc and then to bed.  Husband in room.     Humberto Seals 09/18/2012, 10:57 AM

## 2012-09-18 NOTE — Progress Notes (Signed)
)  That he cannot go to the atient ID: [name], female   DOB: 08/24/1955, 57 y.o.   MRN: [id number]  Subjective/Complaints:  47/51.  57 year old patient who has a history of breast cancer admitted to rehabilitation approximately 5 days ago for severe deconditioning. Chronic medical problems include diabetes OSA and hypertension. She has atrial fibrillation and a history of acute on chronic diastolic heart failure. She is on warfarin anticoagulation.  Episode of rectal bleeding associated with bowel movement this morning.   BP Readings from Last 3 Encounters:  09/18/12 103/65  09/13/12 112/64  09/06/12 110/64    CBG (last 3)   Recent Labs  09/17/12 1123 09/17/12 1624 09/17/12 2140  GLUCAP 124* 121* 112*     Lab Results  Component Value Date   INR 2.54* 09/18/2012   INR 2.75* 09/17/2012   INR 3.37* 09/16/2012   General exam-remains weak, slight tachypnea after transferring from bed to wheelchair; HEENT-unremarkable; nasal cannula oxygen in place; chest diminished breath sounds on the right; cardiovascular-rhythm appears regular at this time; abdomen soft flat nontender; extremities +2 peripheral edema  Impression- severe deconditioning History of breast cancer Diabetes mellitus. Well controlled Hypertension stable History of rectal bleeding. We'll check a CBC in the morning. History of diarrhea. This has resolved;  we'll change Imodium to when necessary only   Objective: Vital Signs: Blood pressure 103/65, pulse 62, temperature 98 F (36.7 C), temperature source Oral, resp. rate 20, weight 100.3 kg (221 lb 1.9 oz), SpO2 95.00%. Dg Chest 2 View  09/17/2012  *RADIOLOGY REPORT*  Clinical Data: Congestive heart failure.  Shortness breath. Weakness.  Cirrhosis.  Breast carcinoma.  CHEST - 2 VIEW  Comparison: 09/16/2012  Findings: Bilateral pulmonary air space disease shows no significant interval change.  Cardiomegaly remains stable. Left- sided Port-A-Cath remains in appropriate  position.  IMPRESSION: Stable bilateral pulmonary air space disease and cardiomegaly.   Original Report Authenticated By: Myles Rosenthal, M.D.    Dg Chest 2 View  09/16/2012  *RADIOLOGY REPORT*  Clinical Data: Shortness of breath.  Prior history of CHF.  CHEST - 2 VIEW  Comparison: Portable chest x-rays 08/26/2012, 08/25/2012.  Two-view chest x-ray 08/24/2012, 08/03/2012, 11/07/2008.  Findings: Suboptimal inspiration accounts for crowded bronchovascular markings, especially in the lung bases, and accentuates the cardiac silhouette.  Taking this into account, cardiac silhouette moderately enlarged but stable.  Severe diffuse interstitial and airspace pulmonary edema. Moderate sized bilateral pleural effusions.  Left subclavian Port-A-Cath tip remains in the SVC.  IMPRESSION: Severe CHF and/or fluid overload, with severe interstitial and airspace pulmonary edema and moderate sized bilateral pleural effusions.   Original Report Authenticated By: Hulan Saas, M.D.     Recent Labs  09/16/12 0500  WBC 10.1  HGB 8.4*  HCT 25.3*  PLT 221    Recent Labs  09/17/12 0500 09/18/12 0508  NA 140 140  K 3.5 3.6  CL 107 104  GLUCOSE 86 85  BUN 14 14  CREATININE 0.53 0.59  CALCIUM 7.7* 7.2*   CBG (last 3)   Recent Labs  09/17/12 1123 09/17/12 1624 09/17/12 2140  GLUCAP 124* 121* 112*    Wt Readings from Last 3 Encounters:  09/18/12 100.3 kg (221 lb 1.9 oz)  09/08/12 97.977 kg (216 lb)  09/06/12 97.6 kg (215 lb 2.7 oz)    Physical Exam:  Constitutional: She is oriented to person, place, and time. She appears well-developed. Appears fatigued. obese  HENT: oral mucosa dry. Crusted/blood at corners of mouth. Dry  blood on cover. Head: Normocephalic and atraumatic.  Eyes: Pupils are equal, round, and reactive to light.  Neck: Normal range of motion.  Cardiovascular: Normal rate and regular rhythm. No murmurs  Pulmonary/Chest: sob. Rales bilaterally Abdominal: Soft. Bowel sounds are normal.  She exhibits no distension. There is no tenderness.  PEG in place without drainage.  Musculoskeletal: She exhibits edema (BLE with 2+ edema.).  Foam dressing bilateral feet. --no break down.  Neurological: She is alert and oriented to person, place, and time.    Cognitively with basic insight and awareness. mild STM deficits. Motor: 4/5 in bilateral deltoid, biceps, triceps3 minus/5 in bilateral hip flexor knee extensor ankle dorsiflexor and plantar flexor  Sensory to light touch is intact in the upper and lower limbs  Speech is clear. No language deficits Psych: pleasant but anxious.     Assessment/Plan: 1. Functional deficits secondary to deconditioning/FTT after breast cancer and associate complications  which require 3+ hours per day of interdisciplinary therapy in a comprehensive inpatient rehab setting. Physiatrist is providing close team supervision and 24 hour management of active medical problems listed below. Physiatrist and rehab team continue to assess barriers to discharge/monitor patient progress toward functional and medical goals.  Can participate in therapy to tolerance  FIM: FIM - Bathing Bathing Steps Patient Completed: Chest;Right Arm;Left Arm;Abdomen;Front perineal area;Right upper leg;Left upper leg Bathing: 0: Activity did not occur  FIM - Upper Body Dressing/Undressing Upper body dressing/undressing steps patient completed: Thread/unthread right sleeve of pullover shirt/dresss;Thread/unthread left sleeve of pullover shirt/dress;Put head through opening of pull over shirt/dress;Pull shirt over trunk Upper body dressing/undressing: 0: Activity did not occur FIM - Lower Body Dressing/Undressing Lower body dressing/undressing steps patient completed: Don/Doff right sock;Don/Doff left sock Lower body dressing/undressing: 0: Activity did not occur  FIM - Toileting Toileting steps completed by patient: Adjust clothing prior to toileting Toileting: 0: Activity did not  occur  FIM - Diplomatic Services operational officer Devices: Elevated toilet seat;Grab bars Toilet Transfers: 0-Activity did not occur  FIM - Banker Devices: Therapist, occupational: 0: Activity did not occur  FIM - Locomotion: Wheelchair Locomotion: Wheelchair: 0: Activity did not occur FIM - Locomotion: Ambulation Locomotion: Ambulation Assistive Devices: Designer, industrial/product Ambulation/Gait Assistance: Not tested (comment) Locomotion: Ambulation: 0: Activity did not occur  Comprehension Comprehension Mode: Auditory Comprehension: 5-Understands basic 90% of the time/requires cueing < 10% of the time  Expression Expression Mode: Verbal Expression: 5-Expresses basic 90% of the time/requires cueing < 10% of the time.  Social Interaction Social Interaction: 5-Interacts appropriately 90% of the time - Needs monitoring or encouragement for participation or interaction.  Problem Solving Problem Solving: 5-Solves basic problems: With no assist  Memory Memory: 5-Recognizes or recalls 90% of the time/requires cueing < 10% of the time  Medical Problem List and Plan:  1. DVT Prophylaxis/Anticoagulation: On coumadin for atrial fibrillation.  2. Pain Management: Morphine prn  3. Depression: Team to provide ego support. To continue Wellbutrin, Lexapro and Zyprexa. Will have LCSW follow for support. neuropsych eval pending   -klonopin helpful yesterday for anxiety 4. Neuropsych: This patient is capable of making decisions on her own behalf.  5. HTN: Monitor on bid basis. Will check daily weights to monitor for fluid overload issues. Resume Lasix and/or Cardizem as indicated.  6. OSA: continue CPAP at nights.  7. Cirrhosis of liver-autoimmune?:  8. Atrial Fibrillation with RVR/CHF: Will continue to monitor HR on bid basis. Has been off Cardizem. Continue metoprolol. Amiodarone per  cards  -aggressive diuresis with iv lasix  -foley  continues  -cxr follow up  -appreciate cardiology assist  -TSH elevated- will defer mgt to cards  -proteint malnutrition 9.Anemia: continues to drift down  -with continued drop, have to consider the risk/benefits of coumadin  -still no follow up guaiac sample 10. DM type 2-diet controlled: Regular diet for now to help with food choices and appetite. Continue TF 6 X day and monitor intake. Will have RD adjusting rate/ timing to help with po's. Continue CBG checks ac/hs with SSI to help with elevated BS.  11. Breast cancer: On Femara. For XRT once stronger. Patient to follow up with Dr. Michell Heinrich aftert discharge 12. Diarrhea:  -TF related.  -no more stool since loose stool on 5/5  -schedule imodium in addition to prn doses 13. FEN-   -TF as above  -added marinol for appetite. (has used megace unsuccessfully in the past)  LOS (Days) 5 A FACE TO FACE EVALUATION WAS PERFORMED  Rogelia Boga 09/18/2012 9:19 AM

## 2012-09-18 NOTE — Progress Notes (Signed)
Patient placed herself on home CPAP for the night,will continue to monitor patient

## 2012-09-18 NOTE — Progress Notes (Signed)
Physical Therapy Session Note  Patient Details  Name: [name] MRN: [id number] Date of Birth: [birth year]  Today's Date: 09/18/2012 Time: 1191-4782 Time Calculation (min): 30 min  Short Term Goals: Week 1:  PT Short Term Goal 1 (Week 1): Pt will be able to complete bed mobility with mod A using adaptive equipment if needed PT Short Term Goal 2 (Week 1): Pt will be able to complete sit to stands consistently with min A PT Short Term Goal 3 (Week 1): Pt will be able to gait x 50' with min A PT Short Term Goal 4 (Week 1): Pt will be able to go up/down 2 steps for home entry with mod A  Therapy Documentation Precautions:  Precautions: Fall, O2 @ 3L via [place] Precaution Comments: Monitor HR Restrictions Weight Bearing Restrictions: No Pain: Pain Assessment: No/denies pain  Therapeutic Exercise:(15') Kinetron x 10' 60cm/sec2 seated oin w/c with feet pushing pedals and rest breaks every minute. Wheelchair management:(15') 1 x 41' with min-assist with patient moving very slowly.   Therapy/Group: Individual Therapy  Rex Kras 09/18/2012, 2:49 PM

## 2012-09-18 NOTE — Progress Notes (Addendum)
[name]  57 y.o.  female  Subjective: Dyspnea and wheezing resolved; last chemotherapy in 07/2012  Allergy: Olmesartan medoxomil; Tetracycline hcl; Venlafaxine; and Adhesive  Objective: Vital signs in last 24 hours: Temp:  [98 F (36.7 C)-98.2 F (36.8 C)] 98 F (36.7 C) (05/10 0546) Pulse Rate:  [62-67] 62 (05/10 0546) Resp:  [20-24] 20 (05/10 0546) BP: (103-113)/(60-71) 103/65 mmHg (05/10 0546) SpO2:  [95 %-97 %] 95 % (05/10 0546) Weight:  [100.3 kg (221 lb 1.9 oz)] 100.3 kg (221 lb 1.9 oz) (05/10 0546)  100.3 kg (221 lb 1.9 oz) Body mass index is 37.94 kg/(m^2).  Weight change: 2.867 kg (6 lb 5.1 oz) Last BM Date: 09/16/12  Intake/Output from previous day: 05/09 0701 - 05/10 0700 In: 1557 [P.O.:600] Out: 4200 [Urine:4200] Total I/O since admission:  -4.8 L, but weight unchanged to slightly increased  General- Well developed; no acute distress; moderately overweight Neck- No JVD, no carotid bruits Lungs- coarse breath sounds at the right base; normal I:E ratio Cardiovascular- normal PMI; normal S1 and S2; grade 2-3/6 systolic ejection murmur at the left costal margin Abdomen- normal bowel sounds; soft and non-tender without masses or organomegaly Skin- Warm, no significant lesions Extremities- Nl distal pulses; 1+ edema  Lab Results: Cardiac Markers:  No results found for this basename: TROPONINI, CK, MB,  in the last 72 hours CBC:   Recent Labs  09/16/12 0500  WBC 10.1  HGB 8.4*  HCT 25.3*  PLT 221   BMET:  Recent Labs  09/17/12 0500 09/18/12 0508  NA 140 140  K 3.5 3.6  CL 107 104  CO2 27 31  GLUCOSE 86 85  BUN 14 14  CREATININE 0.53 0.59  CALCIUM 7.7* 7.2*   EKG: Normal sinus rhythm; minimal and nondiagnostic inferior Q waves; diffuse nonspecific ST-T wave abnormality. No previous tracing for comparison.  Echocardiogram 08/2012: Nl EF; moderate MR  Imaging Studies/Results: Dg Chest 2 View  09/17/2012   Stable bilateral pulmonary air space  disease and cardiomegaly.       Imaging: Imaging results have been reviewed  Medications:  I have reviewed the patient's current medications. Scheduled: . antiseptic oral rinse  15 mL Mouth Rinse BID  . aspirin EC  81 mg Oral q morning - 10a  . buPROPion  150 mg Oral q morning - 10a  . clonazePAM  0.25 mg Oral BID  . dronabinol  2.5 mg Oral BID AC  . escitalopram  20 mg Oral QHS  . feeding supplement (OSMOLITE 1.5 CAL)  237 mL Per Tube Custom  . feeding supplement (OSMOLITE 1.5 CAL)  360 mL Per Tube BID WC  . furosemide  80 mg Intravenous Q8H  . insulin aspart  0-9 Units Subcutaneous TID WC  . letrozole  2.5 mg Oral Daily  . levothyroxine  125 mcg Oral QAC breakfast  . loperamide  2 mg Oral TID  . metoprolol tartrate  75 mg Oral BID  . multivitamin with minerals  1 tablet Oral Daily  . OLANZapine  2.5 mg Oral QHS  . potassium chloride  30 mEq Oral TID  . Warfarin - Pharmacist Dosing Inpatient   Does not apply q1800     Assessment/Plan: Congestive heart failure with preserved LV systolic function:  Most recent echocardiogram in 08/2012 revealed normal LV systolic function. Since chemotherapy was discontinued in March, it is unlikely that she has developed toxic cardiac manifestations of that treatment. Renal function is normal.  Continue diuresis and maintenance of  diuretic therapy is appropriate. She appears to have a reasonable diuresis based upon I&O but no weight loss; hopefully, this is related to inaccuracy of weight determinations.  Atrial fibrillation: Currently sinus rhythm; amiodarone discontinued; adequate anticoagulation.  Hypokalemia: Mild despite substantial potassium replacement; spironolactone will be added to current therapy and electrolytes closely monitored.  LOS: 5 days   Lewisberry Bing 09/18/2012, 9:12 AM

## 2012-09-18 NOTE — Progress Notes (Signed)
ANTICOAGULATION CONSULT NOTE - Follow Up Consult  Pharmacy Consult for Coumadin Indication: atrial fibrillation  Allergies  Allergen Reactions  . Olmesartan Medoxomil Cough    REACTION: ? if cough  . Tetracycline Hcl     Unknown reaction, too long for patient to remember   . Venlafaxine     REACTION: severe dry mo[place]  . Adhesive (Tape) Rash    Patient Measurements: Weight: 221 lb 1.9 oz (100.3 kg) Heparin Dosing Weight:   Vital Signs: Temp: 98 F (36.7 C) (05/10 0546) Temp src: Oral (05/10 0546) BP: 112/64 mmHg (05/10 1157) Pulse Rate: 64 (05/10 1157)  Labs:  Recent Labs  09/16/12 0500 09/17/12 0500 09/18/12 0508  HGB 8.4*  --   --   HCT 25.3*  --   --   PLT 221  --   --   LABPROT 32.2* 27.7* 26.1*  INR 3.37* 2.75* 2.54*  CREATININE  --  0.53 0.59    The CrCl is unknown because both a height and weight (above a minimum accepted value) are required for this calculation.   Medications:  Scheduled:  . antiseptic oral rinse  15 mL Mouth Rinse BID  . aspirin EC  81 mg Oral q morning - 10a  . buPROPion  150 mg Oral q morning - 10a  . clonazePAM  0.25 mg Oral BID  . dronabinol  2.5 mg Oral BID AC  . escitalopram  20 mg Oral QHS  . feeding supplement (OSMOLITE 1.5 CAL)  237 mL Per Tube Custom  . feeding supplement (OSMOLITE 1.5 CAL)  360 mL Per Tube BID WC  . furosemide  80 mg Intravenous Q8H  . insulin aspart  0-9 Units Subcutaneous TID WC  . letrozole  2.5 mg Oral Daily  . levothyroxine  125 mcg Oral QAC breakfast  . metoprolol tartrate  75 mg Oral BID  . multivitamin with minerals  1 tablet Oral Daily  . OLANZapine  2.5 mg Oral QHS  . potassium chloride  30 mEq Oral TID  . spironolactone  25 mg Oral Daily  . [COMPLETED] warfarin  2 mg Oral ONCE-1800  . Warfarin - Pharmacist Dosing Inpatient   Does not apply q1800  . [DISCONTINUED] loperamide  2 mg Oral TID    Assessment: 57yo female with Atrial Fibrillation.  INR therapeutic at 2.54 this AM.  Pt with  episode of rectal bleeding with BM this AM, MD is aware.  Goal of Therapy:  INR 2-3 Monitor platelets by anticoagulation protocol: Yes   Plan:  1.  Coumadin 2mg  2.  F/U in AM  Marisue Humble, PharmD Clinical Pharmacist [facility]

## 2012-09-18 NOTE — Progress Notes (Signed)
Physical Therapy Session Note  Patient Details  Name: [name] MRN: [id number] Date of Birth: 08-07-1955  Today's Date: 09/18/2012 Time: 0730-0830 Time Calculation (min): 60 min  Short Term Goals: Week 1:  PT Short Term Goal 1 (Week 1): Pt will be able to complete bed mobility with mod A using adaptive equipment if needed PT Short Term Goal 2 (Week 1): Pt will be able to complete sit to stands consistently with min A PT Short Term Goal 3 (Week 1): Pt will be able to gait x 50' with min A PT Short Term Goal 4 (Week 1): Pt will be able to go up/down 2 steps for home entry with mod A   Therapy Documentation Precautions:  Precautions: Fall Precaution Comments: Monitor HR Restrictions Weight Bearing Restrictions: No Vital Signs: Therapy Vitals Temp: 98 F (36.7 C) Temp src: Oral Pulse Rate: 62 Resp: 20 BP: 103/65 mmHg Patient Position, if appropriate: Lying Oxygen Therapy SpO2: 95 % O2 Device: CPAP O2 Flow Rate (L/min): 3 L/min Pain: denies pain  Therapeutic Exercise:(15') B LE's in sitting Therapeutic Activity:(15') Transfer Training sit<->stand Mod-A with handheld assist, bed<->w/c Mod-A stand-step/pivot transfer. W/C Management:(15') Propelling w/c x 80' very slow, with min-A Gait Training:(15') Inside parallel bars 2 x 30 and 1 x 10 steps in place    Therapy/Group: Individual Therapy  Miguelina Fore J 09/18/2012, 8:05 AM

## 2012-09-19 ENCOUNTER — Inpatient Hospital Stay (HOSPITAL_COMMUNITY): Payer: 59

## 2012-09-19 ENCOUNTER — Inpatient Hospital Stay (HOSPITAL_COMMUNITY): Payer: 59 | Admitting: *Deleted

## 2012-09-19 DIAGNOSIS — R609 Edema, unspecified: Secondary | ICD-10-CM

## 2012-09-19 DIAGNOSIS — I509 Heart failure, unspecified: Secondary | ICD-10-CM

## 2012-09-19 LAB — BASIC METABOLIC PANEL
CO2: 28 mEq/L (ref 19–32)
Calcium: 7.6 mg/dL — ABNORMAL LOW (ref 8.4–10.5)
Chloride: 102 mEq/L (ref 96–112)
Creatinine, Ser: 0.53 mg/dL (ref 0.50–1.10)
Glucose, Bld: 87 mg/dL (ref 70–99)
Sodium: 139 mEq/L (ref 135–145)

## 2012-09-19 LAB — CBC
HCT: 24.7 % — ABNORMAL LOW (ref 36.0–46.0)
MCH: 27.8 pg (ref 26.0–34.0)
MCV: 87 fL (ref 78.0–100.0)
Platelets: 248 10*3/uL (ref 150–400)
RBC: 2.84 MIL/uL — ABNORMAL LOW (ref 3.87–5.11)

## 2012-09-19 LAB — URINE CULTURE: Colony Count: >=100000

## 2012-09-19 LAB — GLUCOSE, CAPILLARY
Glucose-Capillary: 86 mg/dL (ref 70–99)
Glucose-Capillary: 101 mg/dL — ABNORMAL HIGH (ref 70–99)

## 2012-09-19 MED ORDER — METOPROLOL TARTRATE 25 MG PO TABS
25.0000 mg | ORAL_TABLET | Freq: Two times a day (BID) | ORAL | Status: DC
  Administered 2012-09-19 – 2012-09-28 (×17): 25 mg via ORAL
  Filled 2012-09-19 (×20): qty 1

## 2012-09-19 MED ORDER — POTASSIUM CHLORIDE CRYS ER 20 MEQ PO TBCR
20.0000 meq | EXTENDED_RELEASE_TABLET | Freq: Two times a day (BID) | ORAL | Status: DC
  Administered 2012-09-19 – 2012-09-28 (×19): 20 meq via ORAL
  Filled 2012-09-19 (×21): qty 1

## 2012-09-19 MED ORDER — WARFARIN SODIUM 3 MG PO TABS
3.0000 mg | ORAL_TABLET | Freq: Once | ORAL | Status: AC
  Administered 2012-09-19: 3 mg via ORAL
  Filled 2012-09-19: qty 1

## 2012-09-19 MED ORDER — CIPROFLOXACIN HCL 500 MG PO TABS
500.0000 mg | ORAL_TABLET | Freq: Two times a day (BID) | ORAL | Status: DC
  Administered 2012-09-19 – 2012-09-20 (×3): 500 mg via ORAL
  Filled 2012-09-19 (×6): qty 1

## 2012-09-19 NOTE — Progress Notes (Signed)
[name]  57 y.o.  female  Subjective: Denies dyspnea or chest discomfort.  Brighter mentally today and in good spirits. She was able to travel outside by wheelchair yesterday. Diuretic-induced polyuria and urinary frequency continues, but has been well tolerated.  Allergy: Olmesartan medoxomil; Tetracycline hcl; Venlafaxine; and Adhesive  Objective: Vital signs in last 24 hours: Temp:  [98.3 F (36.8 C)-98.5 F (36.9 C)] 98.3 F (36.8 C) (05/11 0500) Pulse Rate:  [61-68] 67 (05/11 0904) Resp:  [18-21] 21 (05/11 0500) BP: (95-119)/(56-72) 119/72 mmHg (05/11 0904) SpO2:  [93 %-95 %] 95 % (05/10 2330) Weight:  [97.5 kg (214 lb 15.2 oz)] 97.5 kg (214 lb 15.2 oz) (05/11 0538)  97.5 kg (214 lb 15.2 oz) Body mass index is 36.7 kg/(m^2).  Weight change: -2.8 kg (-6 lb 2.8 oz) Last BM Date: 09/18/12  Intake/Output from previous day: 05/10 0701 - 05/11 0700 In: 0  Out: 2550 [Urine:2550] Total I/O since admission:  -7.1 L.  Weight: Decreased 3.5 kg from hospital peak  General- Well developed; no acute distress; moderately overweight; sallow complexion Neck- mild to moderate JVD, no carotid bruits Lungs- clear lung fields; normal I:E ratio Cardiovascular- normal PMI; normal S1 and S2; grade 2/6 systolic ejection murmur at the left costal margin Abdomen- normal bowel sounds; soft and non-tender without masses or organomegaly Skin- Warm, no significant lesions Extremities- Nl distal pulses; 1-2 + edema  Lab Results: CBC:    Recent Labs  09/19/12 0540  WBC 6.8  HGB 7.9*  HCT 24.7*  PLT 248   BMET:   Recent Labs  09/18/12 0508 09/19/12 0540  NA 140 139  K 3.6 4.4  CL 104 102  CO2 31 28  GLUCOSE 85 87  BUN 14 14  CREATININE 0.59 0.53  CALCIUM 7.2* 7.6*   EKG: Normal sinus rhythm; minimal and nondiagnostic inferior Q waves; diffuse nonspecific ST-T wave abnormality. No previous tracing for comparison.  Echocardiogram 08/2012: Nl EF; moderate MR  Imaging  Studies/Results: Dg Chest 2 View  09/17/2012   Stable bilateral pulmonary air space disease and cardiomegaly.       Imaging: Imaging results have been reviewed  Medications:  I have reviewed the patient's current medications. Scheduled: . antiseptic oral rinse  15 mL Mouth Rinse BID  . aspirin EC  81 mg Oral q morning - 10a  . buPROPion  150 mg Oral q morning - 10a  . ciprofloxacin  500 mg Oral BID  . clonazePAM  0.25 mg Oral BID  . dronabinol  2.5 mg Oral BID AC  . escitalopram  20 mg Oral QHS  . feeding supplement (OSMOLITE 1.5 CAL)  237 mL Per Tube Custom  . feeding supplement (OSMOLITE 1.5 CAL)  360 mL Per Tube BID WC  . furosemide  80 mg Intravenous Q8H  . insulin aspart  0-9 Units Subcutaneous TID WC  . letrozole  2.5 mg Oral Daily  . levothyroxine  125 mcg Oral QAC breakfast  . metoprolol tartrate  25 mg Oral BID  . multivitamin with minerals  1 tablet Oral Daily  . OLANZapine  2.5 mg Oral QHS  . potassium chloride  30 mEq Oral TID  . spironolactone  25 mg Oral Daily  . Warfarin - Pharmacist Dosing Inpatient   Does not apply q1800     Assessment/Plan: Congestive heart failure with preserved LV systolic function: I&O and weights are now consistent and indicate significant diuresis. Husband reports that furosemide has been held for blood pressure  in the 90s. Hold value decreased to 80 mmHg or symptomatic hypotension.   Atrial fibrillation: Currently sinus rhythm; amiodarone discontinued; adequate anticoagulation.  Hypokalemia: Improved with initiation of spironolactone.  Dose of potassium replacement will be decreased.   LOS: 6 days   [place] Bing 09/19/2012, 10:05 AM

## 2012-09-19 NOTE — Plan of Care (Signed)
Problem: RH SAFETY Goal: RH STG DEMO UNDERSTANDING HOME SAFETY PRECAUTIONS Outcome: Progressing Husband staying the night with patient.

## 2012-09-19 NOTE — Progress Notes (Signed)
ANTICOAGULATION CONSULT NOTE - Follow Up Consult  Pharmacy Consult for Coumadin Indication: atrial fibrillation  Allergies  Allergen Reactions  . Olmesartan Medoxomil Cough    REACTION: ? if cough  . Tetracycline Hcl     Unknown reaction, too long for patient to remember   . Venlafaxine     REACTION: severe dry moouth  . Adhesive (Tape) Rash    Patient Measurements: Height: 5' 4.17" (163 cm) Weight: 214 lb 15.2 oz (97.5 kg) IBW/kg (Calculated) : 55.1 Heparin Dosing Weight:   Vital Signs: Temp: 98.3 F (36.8 C) (05/11 0500) BP: 109/66 mmHg (05/11 1035) Pulse Rate: 74 (05/11 1035)  Labs:  Recent Labs  09/17/12 0500 09/18/12 0508 09/19/12 0540  HGB  --   --  7.9*  HCT  --   --  24.7*  PLT  --   --  248  LABPROT 27.7* 26.1* 22.1*  INR 2.75* 2.54* 2.03*  CREATININE 0.53 0.59 0.53    Estimated Creatinine Clearance: 87.2 ml/min (by C-G formula based on Cr of 0.53).   Medications:  Scheduled:  . antiseptic oral rinse  15 mL Mouth Rinse BID  . aspirin EC  81 mg Oral q morning - 10a  . buPROPion  150 mg Oral q morning - 10a  . ciprofloxacin  500 mg Oral BID  . clonazePAM  0.25 mg Oral BID  . dronabinol  2.5 mg Oral BID AC  . escitalopram  20 mg Oral QHS  . feeding supplement (OSMOLITE 1.5 CAL)  237 mL Per Tube Custom  . feeding supplement (OSMOLITE 1.5 CAL)  360 mL Per Tube BID WC  . furosemide  80 mg Intravenous Q8H  . insulin aspart  0-9 Units Subcutaneous TID WC  . letrozole  2.5 mg Oral Daily  . levothyroxine  125 mcg Oral QAC breakfast  . metoprolol tartrate  25 mg Oral BID  . multivitamin with minerals  1 tablet Oral Daily  . OLANZapine  2.5 mg Oral QHS  . potassium chloride  20 mEq Oral BID  . spironolactone  25 mg Oral Daily  . [COMPLETED] warfarin  2 mg Oral ONCE-1800  . Warfarin - Pharmacist Dosing Inpatient   Does not apply q1800  . [DISCONTINUED] metoprolol tartrate  75 mg Oral BID  . [DISCONTINUED] potassium chloride  30 mEq Oral TID     Assessment: 57yo female with AFib.  Pt with episode rectal bleeding x 1 with BM yesterday and Hg down to 7.9 this AM.  MD is aware.  INR 2.03 this AM- down, but dose held x 2 days this week.  Will cautiously increase Coumadin today and continue to watch Hg closely.  Goal of Therapy:  INR 2-3 Monitor platelets by anticoagulation protocol: Yes   Plan:  1.  Coumadin 3mg  2.  Continue daily INR and check Hg in AM  Marisue Humble, PharmD Clinical Pharmacist [facility]

## 2012-09-19 NOTE — Progress Notes (Signed)
Patient ID: [name], female   DOB: 01/05/1956, 57 y.o.   MRN: [id number]  )  That he cannot go to the atient ID: [name], female   DOB: 1955/07/09, 57 y.o.   MRN: [id number]  Subjective/Complaints:  21/29.   57 year old patient who has a history of breast cancer admitted to rehabilitation approximately 6 days ago for severe deconditioning. Chronic medical problems include diabetes OSA and hypertension. She has atrial fibrillation and a history of acute on chronic diastolic heart failure. She is on warfarin anticoagulation.  Episode of rectal bleeding associated with bowel movement  yesterday morning which has not reoccurred. Remains afebrile with persistent tachypnea. Urine culture is positive for UTI.  Hemoglobin 7.9 which is down from 8.4. Chest x-ray reveals stable bilateral airspace disease.  BP Readings from Last 3 Encounters:  09/19/12 112/60  09/13/12 112/64  09/06/12 110/64    CBG (last 3)   Recent Labs  09/18/12 1705 09/18/12 2047 09/19/12 0714  GLUCAP 96 102* 86     Lab Results  Component Value Date   INR 2.03* 09/19/2012   INR 2.54* 09/18/2012   INR 2.75* 09/17/2012   General exam-remains weak, slight tachypnea after transferring from bed to wheelchair; HEENT-unremarkable; nasal cannula oxygen in place; chest  increased rales and rhonchi right chest; cardiovascular-rhythm appears regular at this time; abdomen soft flat nontender; s/p PEG  extremities +2 peripheral edema; heel pads in place  Impression- severe deconditioning History of breast cancer Diabetes mellitus. Well controlled Hypertension stable; has been running low with diuresis. We'll decrease metoprolol and hold for systolic blood pressure less than 100 History of rectal bleeding. We'll check a CBC in the morning. UTI- will place on Cipro pending sensitivities Worsen anemia. Will followup on a CBC as well as electrolytes in the morning Worsening right-sided chest congestion. Will review a chest  x-ray today   Objective: Vital Signs: Blood pressure 112/60, pulse 62, temperature 98.3 F (36.8 C), temperature source Oral, resp. rate 21, weight 97.5 kg (214 lb 15.2 oz), SpO2 95.00%. No results found.  Recent Labs  09/19/12 0540  WBC 6.8  HGB 7.9*  HCT 24.7*  PLT 248    Recent Labs  09/17/12 0500 09/18/12 0508  NA 140 140  K 3.5 3.6  CL 107 104  GLUCOSE 86 85  BUN 14 14  CREATININE 0.53 0.59  CALCIUM 7.7* 7.2*   CBG (last 3)   Recent Labs  09/18/12 1705 09/18/12 2047 09/19/12 0714  GLUCAP 96 102* 86    Wt Readings from Last 3 Encounters:  09/19/12 97.5 kg (214 lb 15.2 oz)  09/08/12 97.977 kg (216 lb)  09/06/12 97.6 kg (215 lb 2.7 oz)    Physical Exam:  Constitutional: She is oriented to person, place, and time. She appears well-developed. Appears fatigued. obese  HENT: oral mucosa dry. Crusted/blood at corners of mouth. Dry blood on cover. Head: Normocephalic and atraumatic.  Eyes: Pupils are equal, round, and reactive to light.  Neck: Normal range of motion.  Cardiovascular: Normal rate and regular rhythm. No murmurs  Pulmonary/Chest: sob. Rales bilaterally Abdominal: Soft. Bowel sounds are normal. She exhibits no distension. There is no tenderness.  PEG in place without drainage.  Musculoskeletal: She exhibits edema (BLE with 2+ edema.).  Foam dressing bilateral feet. --no break down.  Neurological: She is alert and oriented to person, place, and time.    Cognitively with basic insight and awareness. mild STM deficits. Motor: 4/5 in bilateral deltoid, biceps, triceps3 minus/5  in bilateral hip flexor knee extensor ankle dorsiflexor and plantar flexor  Sensory to light touch is intact in the upper and lower limbs  Speech is clear. No language deficits Psych: pleasant but anxious.     Assessment/Plan: 1. Functional deficits secondary to deconditioning/FTT after breast cancer and associate complications  which require 3+ hours per day of  interdisciplinary therapy in a comprehensive inpatient rehab setting. Physiatrist is providing close team supervision and 24 hour management of active medical problems listed below. Physiatrist and rehab team continue to assess barriers to discharge/monitor patient progress toward functional and medical goals.  Can participate in therapy to tolerance  FIM: FIM - Bathing Bathing Steps Patient Completed: Chest;Right Arm;Left Arm;Abdomen;Front perineal area;Right upper leg;Left upper leg Bathing: 0: Activity did not occur  FIM - Upper Body Dressing/Undressing Upper body dressing/undressing steps patient completed: Thread/unthread right sleeve of pullover shirt/dresss;Thread/unthread left sleeve of pullover shirt/dress;Put head through opening of pull over shirt/dress;Pull shirt over trunk Upper body dressing/undressing: 0: Activity did not occur FIM - Lower Body Dressing/Undressing Lower body dressing/undressing steps patient completed: Don/Doff right sock;Don/Doff left sock Lower body dressing/undressing: 0: Activity did not occur  FIM - Toileting Toileting steps completed by patient: Adjust clothing prior to toileting Toileting: 0: Activity did not occur  FIM - Diplomatic Services operational officer Devices: Elevated toilet seat;Grab bars Toilet Transfers: 0-Activity did not occur  FIM - Banker Devices: Manufacturing systems engineer Transfer: 3: Supine > Sit: Mod A (lifting assist/Pt. 50-74%/lift 2 legs;3: Bed > Chair or W/C: Mod A (lift or lower assist)  FIM - Locomotion: Wheelchair Locomotion: Wheelchair: 0: Activity did not occur FIM - Locomotion: Ambulation Locomotion: Ambulation Assistive Devices: Designer, industrial/product Ambulation/Gait Assistance: Not tested (comment) Locomotion: Ambulation: 0: Activity did not occur  Comprehension Comprehension Mode: Auditory Comprehension: 5-Understands complex 90% of the time/Cues < 10% of the  time  Expression Expression Mode: Verbal Expression: 5-Expresses basic needs/ideas: With extra time/assistive device  Social Interaction Social Interaction: 5-Interacts appropriately 90% of the time - Needs monitoring or encouragement for participation or interaction.  Problem Solving Problem Solving: 5-Solves basic problems: With no assist  Memory Memory: 5-Recognizes or recalls 90% of the time/requires cueing < 10% of the time  Medical Problem List and Plan:  1. DVT Prophylaxis/Anticoagulation: On coumadin for atrial fibrillation.  2. Pain Management: Morphine prn  3. Depression: Team to provide ego support. To continue Wellbutrin, Lexapro and Zyprexa. Will have LCSW follow for support. neuropsych eval pending   -klonopin helpful yesterday for anxiety 4. Neuropsych: This patient is capable of making decisions on her own behalf.  5. HTN: Monitor on bid basis. Will check daily weights to monitor for fluid overload issues. Resume Lasix and/or Cardizem as indicated.  6. OSA: continue CPAP at nights.  7. Cirrhosis of liver-autoimmune?:  8. Atrial Fibrillation with RVR/CHF: Will continue to monitor HR on bid basis. Has been off Cardizem. Continue metoprolol. Amiodarone per cards  -aggressive diuresis with iv lasix  -foley continues  -cxr follow up  -appreciate cardiology assist  -TSH elevated- will defer mgt to cards  -proteint malnutrition 9.Anemia: continues to drift down  -with continued drop, have to consider the risk/benefits of coumadin  -still no follow up guaiac sample 10. DM type 2-diet controlled: Regular diet for now to help with food choices and appetite. Continue TF 6 X day and monitor intake. Will have RD adjusting rate/ timing to help with po's. Continue CBG checks ac/hs with SSI to help with  elevated BS.  11. Breast cancer: On Femara. For XRT once stronger. Patient to follow up with Dr. Michell Heinrich aftert discharge 12. Diarrhea:  -TF related.  -no more stool since  loose stool on 5/5  -schedule imodium in addition to prn doses 13. FEN-   -TF as above  -added marinol for appetite. (has used megace unsuccessfully in the past)  LOS (Days) 6 A FACE TO FACE EVALUATION WAS PERFORMED  [name] 09/19/2012 8:32 AM

## 2012-09-19 NOTE — Progress Notes (Signed)
Patient slept quietly throughout the night.  No S/O discomfort or distress.  CPAP in use.  No cough present at this time.  BP 112/60, HR 62.  Daily weight 97.5kg.  Patient in agreement to receive lasix as ordered.  Husband remains at bedside.   [name]

## 2012-09-19 NOTE — Progress Notes (Addendum)
Occupational Therapy Note  Patient Details  Name: CAMARIE MCTIGUE MRN: [id number] Date of Birth: 07-10-1955 Today's Date: 09/19/2012  Time:  0830-1000  (90 min) Individual session Pain:  None Oxygen:  3 liters; 91% or higher during session  Pt reported she had a good nights rest and felt pretty good this am.  Husband, Don present during session.  Donned thighthighs.  Addressed bed mobility, sitting unsupported, pursed lip breathing. Transfers to wc, and toilet; functional standing balance, and mobility with walker and wc.  Pt was max assist from supine to sit.  She needed verbal cues for technique and manual assist.  Addressed sit to stand and stand pivot transfer with mod assist for lifting and lowering to wc.  Propelled wc to toilet with max assist and transferred to toilet with bars and mod assist.  Needed max assist with clothes.  Pt. Had large BM and was set up for cleaning self.  She stood x 4 to wash peri area for 30 sec. before sitting.   Sat at sink in wc and washed hands, brushed teeth independently. Ambulated 3 feet with RW (mod a, +1 for wc) with husband bringing wc behind.   Pt. Agreed to sit up until after lunch (about 2.5 hours longer.)   Humberto Seals 09/19/2012, 9:58 AM
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
## 2012-09-20 ENCOUNTER — Inpatient Hospital Stay (HOSPITAL_COMMUNITY): Payer: 59

## 2012-09-20 ENCOUNTER — Inpatient Hospital Stay (HOSPITAL_COMMUNITY): Payer: 59 | Admitting: Occupational Therapy

## 2012-09-20 ENCOUNTER — Inpatient Hospital Stay (HOSPITAL_COMMUNITY): Payer: 59 | Admitting: Physical Therapy

## 2012-09-20 DIAGNOSIS — F341 Dysthymic disorder: Secondary | ICD-10-CM

## 2012-09-20 DIAGNOSIS — E669 Obesity, unspecified: Secondary | ICD-10-CM

## 2012-09-20 DIAGNOSIS — C50919 Malignant neoplasm of unspecified site of unspecified female breast: Secondary | ICD-10-CM

## 2012-09-20 DIAGNOSIS — R5381 Other malaise: Secondary | ICD-10-CM

## 2012-09-20 LAB — COMPREHENSIVE METABOLIC PANEL
ALT: 26 U/L (ref 0–35)
Alkaline Phosphatase: 188 U/L — ABNORMAL HIGH (ref 39–117)
BUN: 13 mg/dL (ref 6–23)
CO2: 32 mEq/L (ref 19–32)
Chloride: 104 mEq/L (ref 96–112)
GFR calc Af Amer: >90 mL/min (ref >90)
GFR calc non Af Amer: >90 mL/min (ref >90)
Glucose, Bld: 70 mg/dL (ref 70–99)
Potassium: 3.9 mEq/L (ref 3.5–5.1)
Sodium: 139 mEq/L (ref 135–145)
Total Bilirubin: 0.2 mg/dL — ABNORMAL LOW (ref 0.3–1.2)

## 2012-09-20 LAB — GLUCOSE, CAPILLARY
Glucose-Capillary: 74 mg/dL (ref 70–99)
Glucose-Capillary: 74 mg/dL (ref 70–99)
Glucose-Capillary: 97 mg/dL (ref 70–99)

## 2012-09-20 LAB — HEMOGLOBIN: Hemoglobin: 8 g/dL — ABNORMAL LOW (ref 12.0–15.0)

## 2012-09-20 MED ORDER — WARFARIN SODIUM 3 MG PO TABS
3.0000 mg | ORAL_TABLET | Freq: Once | ORAL | Status: AC
  Administered 2012-09-20: 3 mg via ORAL
  Filled 2012-09-20: qty 1

## 2012-09-20 MED ORDER — CEPHALEXIN 250 MG PO CAPS
250.0000 mg | ORAL_CAPSULE | Freq: Three times a day (TID) | ORAL | Status: AC
  Administered 2012-09-20 – 2012-09-25 (×15): 250 mg via ORAL
  Filled 2012-09-20 (×15): qty 1

## 2012-09-20 MED ORDER — FUROSEMIDE 40 MG PO TABS
40.0000 mg | ORAL_TABLET | Freq: Two times a day (BID) | ORAL | Status: DC
  Administered 2012-09-20 – 2012-09-21 (×4): 40 mg via ORAL
  Filled 2012-09-20 (×7): qty 1

## 2012-09-20 NOTE — Progress Notes (Signed)
Physical Therapy Session Note  Patient Details  Name: [name] MRN: [id number] Date of Birth: 1955-06-20  Today's Date: 09/20/2012 Time: 4098-1191 Time Calculation (min): 44 min  Short Term Goals: Week 1:  PT Short Term Goal 1 (Week 1): Pt will be able to complete bed mobility with mod A using adaptive equipment if needed PT Short Term Goal 2 (Week 1): Pt will be able to complete sit to stands consistently with min A PT Short Term Goal 3 (Week 1): Pt will be able to gait x 50' with min A PT Short Term Goal 4 (Week 1): Pt will be able to go up/down 2 steps for home entry with mod A  Skilled Therapeutic Interventions/Progress Updates:    Pt up in recliner reporting she is feeling somewhat better today. Transferred out of recliner with RW with overall min A for sit to stand and step with RW to w/c. Focused on overall activity tolerance/endurance, sit to stands, gait with RW, and dynamic standing balance/standing tolerance. Pt able to gait with RW with steady A with improved step length x 25' x 2 with seated rest break. Completed seated therex during rest break including LAQ and seated marches for LE strengthening. Worked on dynamic standing balance and standing tolerance (limted to about a minute) to fold towels with close S; min A needed for sit to stand. Multiple rest breaks needed due to fatigue but improved mobility noted.    Therapy Documentation Precautions:  Precautions Precautions: Fall Precaution Comments: Monitor HR Restrictions Weight Bearing Restrictions: No  Pain: Denies pain.  Locomotion : Ambulation Ambulation/Gait Assistance: 4: Min assist   See FIM for current functional status  Therapy/Group: Individual Therapy  [name] [facility] 09/20/2012, 10:30 AM

## 2012-09-20 NOTE — Plan of Care (Signed)
Problem: RH SAFETY Goal: RH STG DEMO UNDERSTANDING HOME SAFETY PRECAUTIONS Outcome: Progressing Husband stays with patient throughout the night.

## 2012-09-20 NOTE — Progress Notes (Signed)
Physical Therapy Session Note  Patient Details  Name: [name] MRN: [id number] Date of Birth: 1955/05/17  Today's Date: 09/20/2012 Time: 1300-1330 Time Calculation (min): 30 min  Short Term Goals: Week 1:  PT Short Term Goal 1 (Week 1): Pt will be able to complete bed mobility with mod A using adaptive equipment if needed PT Short Term Goal 2 (Week 1): Pt will be able to complete sit to stands consistently with min A PT Short Term Goal 3 (Week 1): Pt will be able to gait x 50' with min A PT Short Term Goal 4 (Week 1): Pt will be able to go up/down 2 steps for home entry with mod A  Skilled Therapeutic Interventions/Progress Updates:    Steady A transfer out of recliner to w/c using RW. Nustep for endurance and strengthening of all 4 extremities and overall activity toelrance x 5 min on level 4. Gait with RW x 25' with min A for endurance training and functional mobility. Pt to stay up in w/c for next therapy session.   Therapy Documentation Precautions:  Precautions Precautions: Fall Precaution Comments: Monitor HR Restrictions Weight Bearing Restrictions: No  Pain: Denies pain.     See FIM for current functional status  Therapy/Group: Individual Therapy  [name] [facility] 09/20/2012, 3:44 PM

## 2012-09-20 NOTE — Progress Notes (Signed)
Occupational Therapy Session Note  Patient Details  Name: [name] MRN: [id number] Date of Birth: Jul 23, 1955  Today's Date: 09/20/2012 Time: 0730-0825 Time Calculation (min): 55 min  Short Term Goals: Week 1:  OT Short Term Goal 1 (Week 1): Patient will perform toilet transfer with moderate assistance using DME & AD prn OT Short Term Goal 2 (Week 1): Patient will bathe 10/10 body parts with moderate assistance in sit<>stand position using AE prn OT Short Term Goal 3 (Week 1): Patient will perform LB dsg (pants only) with minimal assistance in sit<>stand position OT Short Term Goal 4 (Week 1): Patient will donn shirt with minimal assistance, seated un-supported OT Short Term Goal 5 (Week 1): AE for LB ADLs will be introduced to patient  Skilled Therapeutic Interventions/Progress Updates:  Patient with no complaints of pain, but with complaints of overall fatigue Patient found supine in bed. Therapist donned bilateral TEDs and bilateral socks. At this time, patient refused bathing & dressing, requesting to wear clothes she already had on. Patient engaged in bed mobility with moderate assistance with HOB raised and required use of arm rails. From there patient stood using rolling walker and ambulated ~5 steps -> recliner. Patient with complaints of fatigue after short steps. Therapist then set-up w/c at doorway and patient ambulated from recliner -> w/c ~10 feet using rolling walker. After ~5 minute rest break, therapist propelled patient -> sink for grooming tasks of washing hands, washing face, and brushing teeth. Patient on 3 liters of 02 and 02 sats =92% after activity. Focused skilled intervention on overall activity tolerance/endurance, functional mobility with use of rolling walker, and grooming tasks at sink. At end of session, left patient seated in recliner with BLEs elevated. Call bell & phone left within reach.  Precautions:  Precautions Precautions: Fall Precaution Comments:  Monitor HR Restrictions Weight Bearing Restrictions: No  See FIM for current functional status  Therapy/Group: Individual Therapy  Cierra Rothgeb 09/20/2012, 8:34 AM

## 2012-09-20 NOTE — Progress Notes (Signed)
Physical Therapy Session Note  Patient Details  Name: [name] MRN: [id number] Date of Birth: Aug 17, 1955  Today's Date: 09/20/2012 Time: 1400-1433 Time Calculation (min): 33 min  Short Term Goals: Week 1:  PT Short Term Goal 1 (Week 1): Pt will be able to complete bed mobility with mod A using adaptive equipment if needed PT Short Term Goal 2 (Week 1): Pt will be able to complete sit to stands consistently with min A PT Short Term Goal 3 (Week 1): Pt will be able to gait x 50' with min A PT Short Term Goal 4 (Week 1): Pt will be able to go up/down 2 steps for home entry with mod A  Skilled Therapeutic Interventions/Progress Updates:    Session focused on bed mobility and transfers in home environment. Wheelchair <> ADL bed transfers x 2 with RW min/mod assist, sit > supine mod assist for bil. LEs, supine > sit min assist progressing to supervision with cues for sequencing and keeping trunk rolled towards edge of bed. Practiced scooting hips back onto slightly elevated bed (similar height to home bed) with use of a stool under feet, pt able obtain better positioning on bed prior to sit > supine. Discussed with husband and he reports they have a stool he can use at home. Educated that this was NOT for standing on but to assist with pushing hips further onto bed. Husband and wife verbalized understanding.   Pt dyspneic throughout treatment and requires multiple rests during bed mobility however SpO2 >96% on 3L [place] O2, nursing to begin weaning off supplemental O2.   Therapy Documentation Precautions:  Precautions Precautions: Fall Precaution Comments: Monitor HR Restrictions Weight Bearing Restrictions: No Pain: Pain Assessment Pain Assessment: No/denies pain  See FIM for current functional status  Therapy/Group: Individual Therapy  Wilhemina Bonito 09/20/2012, 3:55 PM

## 2012-09-20 NOTE — Plan of Care (Signed)
Problem: RH SKIN INTEGRITY Goal: RH STG SKIN FREE OF INFECTION/BREAKDOWN Min A  Outcome: Progressing Stage 2 on sacrum, not healing.

## 2012-09-20 NOTE — Progress Notes (Signed)
ANTICOAGULATION CONSULT NOTE - Follow Up Consult  Pharmacy Consult for coumadin Indication: atrial fibrillation  Allergies  Allergen Reactions  . Olmesartan Medoxomil Cough    REACTION: ? if cough  . Tetracycline Hcl     Unknown reaction, too long for patient to remember   . Venlafaxine     REACTION: severe dry moouth  . Adhesive (Tape) Rash    Patient Measurements: Height: 5' 4.17" (163 cm) Weight: 202 lb 8 oz (91.853 kg) IBW/kg (Calculated) : 55.1 Heparin Dosing Weight:   Vital Signs: Temp: 98 F (36.7 C) (05/12 0553) Temp src: Oral (05/12 0553) BP: 98/70 mmHg (05/12 0700) Pulse Rate: 66 (05/12 0700)  Labs:  Recent Labs  09/18/12 0508 09/19/12 0540 09/20/12 0440  HGB  --  7.9* 8.0*  HCT  --  24.7*  --   PLT  --  248  --   LABPROT 26.1* 22.1* 22.4*  INR 2.54* 2.03* 2.06*  CREATININE 0.59 0.53 0.53    Estimated Creatinine Clearance: 84.5 ml/min (by C-G formula based on Cr of 0.53).   Medications:  Scheduled:  . antiseptic oral rinse  15 mL Mouth Rinse BID  . aspirin EC  81 mg Oral q morning - 10a  . buPROPion  150 mg Oral q morning - 10a  . ciprofloxacin  500 mg Oral BID  . clonazePAM  0.25 mg Oral BID  . dronabinol  2.5 mg Oral BID AC  . escitalopram  20 mg Oral QHS  . feeding supplement (OSMOLITE 1.5 CAL)  237 mL Per Tube Custom  . feeding supplement (OSMOLITE 1.5 CAL)  360 mL Per Tube BID WC  . furosemide  40 mg Oral BID  . insulin aspart  0-9 Units Subcutaneous TID WC  . letrozole  2.5 mg Oral Daily  . levothyroxine  125 mcg Oral QAC breakfast  . metoprolol tartrate  25 mg Oral BID  . multivitamin with minerals  1 tablet Oral Daily  . OLANZapine  2.5 mg Oral QHS  . potassium chloride  20 mEq Oral BID  . spironolactone  25 mg Oral Daily  . [COMPLETED] warfarin  3 mg Oral ONCE-1800  . Warfarin - Pharmacist Dosing Inpatient   Does not apply q1800  . [DISCONTINUED] furosemide  80 mg Intravenous Q8H  . [DISCONTINUED] metoprolol tartrate  75 mg Oral  BID  . [DISCONTINUED] potassium chloride  30 mEq Oral TID   Infusions:    Assessment: 57 yo female with afib is currently on therapeutic coumadin.  INR is 2.06. Hgb stable 8.  Now on cipro Goal of Therapy:  INR 2-3    Plan:  1) Coumadin 3mg  po x1 2) INR in am 3) Watch Hgb  Harnoor Reta, Tsz-Yin 09/20/2012,8:27 AM

## 2012-09-20 NOTE — Progress Notes (Signed)
Subjective/Complaints: Had a good night. Diuresed further over the weekend. A 12 point review of systems has been performed and if not noted above is otherwise negative.   Objective: Vital Signs: Blood pressure 93/57, pulse 65, temperature 98 F (36.7 C), temperature source Oral, resp. rate 20, height 5' 4.17" (1.63 m), weight 91.853 kg (202 lb 8 oz), SpO2 97.00%. Dg Chest Port 1 View  09/19/2012  *RADIOLOGY REPORT*  Clinical Data: Pneumonia and history of breast carcinoma.  PORTABLE CHEST - 1 VIEW  Comparison: 09/17/2012  Findings: Lung expansion has improved bilaterally with residual prominent infiltrates bilaterally.  Density of infiltrates in the upper lung zones appear slightly improved.  No significant pleural effusions are identified.  Heart size is stable.  There is stable positioning of a Port-A-Cath.  IMPRESSION: Improved expansion of both lungs with decreased density of upper zone infiltrates.   Original Report Authenticated By: Irish Lack, M.D.     Recent Labs  09/19/12 0540 09/20/12 0440  WBC 6.8  --   HGB 7.9* 8.0*  HCT 24.7*  --   PLT 248  --     Recent Labs  09/19/12 0540 09/20/12 0440  NA 139 139  K 4.4 3.9  CL 102 104  GLUCOSE 87 70  BUN 14 13  CREATININE 0.53 0.53  CALCIUM 7.6* 7.4*   CBG (last 3)   Recent Labs  09/19/12 1631 09/19/12 2041 09/20/12 0719  GLUCAP 101* 119* 74    Wt Readings from Last 3 Encounters:  09/20/12 91.853 kg (202 lb 8 oz)  09/08/12 97.977 kg (216 lb)  09/06/12 97.6 kg (215 lb 2.7 oz)    Physical Exam:  Constitutional: She is oriented to person, place, and time. She appears well-developed. Appears fatigued. obese  HENT: oral mucosa dry. Crusted/blood at corners of mouth. Dry blood on cover. Head: Normocephalic and atraumatic.  Eyes: Pupils are equal, round, and reactive to light.  Neck: Normal range of motion.  Cardiovascular: Normal rate and regular rhythm. No murmurs  Pulmonary/Chest: breathing comfortably.  Still with Rales bilaterally Abdominal: Soft. Bowel sounds are normal. She exhibits no distension. There is no tenderness.  PEG in place without drainage.  Musculoskeletal: She exhibits edema (BLE with tr to 1+).  Foam dressing bilateral feet. --no break down.  Neurological: She is alert and oriented to person, place, and time.    Cognitively with basic insight and awareness. mild STM deficits. Motor: 4/5 in bilateral deltoid, biceps, triceps3 minus/5 in bilateral hip flexor knee extensor ankle dorsiflexor and plantar flexor  Sensory to light touch is intact in the upper and lower limbs  Speech is clear. No language deficits Psych: pleasant but anxious.     Assessment/Plan: 1. Functional deficits secondary to deconditioning/FTT after breast cancer and associate complications  which require 3+ hours per day of interdisciplinary therapy in a comprehensive inpatient rehab setting. Physiatrist is providing close team supervision and 24 hour management of active medical problems listed below. Physiatrist and rehab team continue to assess barriers to discharge/monitor patient progress toward functional and medical goals.    FIM: FIM - Bathing Bathing Steps Patient Completed: Chest;Right Arm;Left Arm;Abdomen;Front perineal area;Right upper leg;Left upper leg Bathing: 0: Activity did not occur  FIM - Upper Body Dressing/Undressing Upper body dressing/undressing steps patient completed: Thread/unthread right sleeve of pullover shirt/dresss;Thread/unthread left sleeve of pullover shirt/dress;Put head through opening of pull over shirt/dress;Pull shirt over trunk Upper body dressing/undressing: 0: Activity did not occur FIM - Lower Body Dressing/Undressing Lower body dressing/undressing steps  patient completed: Don/Doff right sock;Don/Doff left sock Lower body dressing/undressing: 0: Activity did not occur  FIM - Toileting Toileting steps completed by patient: Performs perineal  hygiene Toileting Assistive Devices: Grab bar or rail for support Toileting: 2: Max-Patient completed 1 of 3 steps  FIM - Diplomatic Services operational officer Devices: Grab bars Toilet Transfers: 3-From toilet/BSC: Mod A (lift or lower assist)  FIM - Banker Devices: Manufacturing systems engineer Transfer: 2: Supine > Sit: Max A (lifting assist/Pt. 25-49%);3: Bed > Chair or W/C: Mod A (lift or lower assist)  FIM - Locomotion: Wheelchair Locomotion: Wheelchair: 0: Activity did not occur FIM - Locomotion: Ambulation Locomotion: Ambulation Assistive Devices: Designer, industrial/product Ambulation/Gait Assistance: 1: +2 Total assist Locomotion: Ambulation: 1: Two helpers  Comprehension Comprehension Mode: Auditory Comprehension: 5-Understands complex 90% of the time/Cues < 10% of the time  Expression Expression Mode: Verbal Expression: 5-Expresses basic needs/ideas: With extra time/assistive device  Social Interaction Social Interaction: 5-Interacts appropriately 90% of the time - Needs monitoring or encouragement for participation or interaction.  Problem Solving Problem Solving: 5-Solves complex 90% of the time/cues < 10% of the time  Memory Memory: 5-Recognizes or recalls 90% of the time/requires cueing < 10% of the time  Medical Problem List and Plan:  1. DVT Prophylaxis/Anticoagulation: On coumadin for atrial fibrillation.  2. Pain Management: Morphine prn  3. Depression: Team to provide ego support. To continue Wellbutrin, Lexapro and Zyprexa. Will have LCSW follow for support. neuropsych eval pending   -klonopin helpful yesterday for anxiety 4. Neuropsych: This patient is capable of making decisions on her own behalf.  5. HTN: Monitor on bid basis. Will check daily weights to monitor for fluid overload issues. Resume Lasix and/or Cardizem as indicated.  6. OSA: continue CPAP at nights.  7. Cirrhosis of liver-autoimmune?:  8. Atrial Fibrillation  with RVR/CHF: Will continue to monitor HR on bid basis. Has been off Cardizem. Continue metoprolol. Amiodarone per cards  -aggressive diuresis with iv lasix  -foley continues  -cxr follow up  -appreciate cardiology assist  -TSH elevated- will defer mgt to cards  -proteint malnutrition 9.Anemia: remains around 8  -continue to follow serially   10. DM type 2-diet controlled: Regular diet for now to help with food choices and appetite. Continue TF 6 X day and monitor intake. Will have RD adjusting rate/ timing to help with po's. Continue CBG checks ac/hs with SSI to help with elevated BS.  11. Breast cancer: On Femara. For XRT once stronger. Patient to follow up with Dr. Michell Heinrich aftert discharge 12. Diarrhea:  -TF related.  -no more stool since loose stool on 5/5  -schedule imodium in addition to prn doses 13. FEN-   -TF as above  Increase marinol  LOS (Days) 7 A FACE TO FACE EVALUATION WAS PERFORMED  SWARTZ,ZACHARY T 09/20/2012 8:12 AM

## 2012-09-20 NOTE — Progress Notes (Signed)
[name]  57 y.o.  female  Subjective: Denies dyspnea or chest discomfort.  Brighter mentally today and in good spirits. She was able to travel outside by wheelchair yesterday. She is working with OT this am.   Allergy: Olmesartan medoxomil; Tetracycline hcl; Venlafaxine; and Adhesive  Objective: Vital signs in last 24 hours: Temp:  [97.6 F (36.4 C)-98 F (36.7 C)] 98 F (36.7 C) (05/12 0553) Pulse Rate:  [64-74] 65 (05/12 0553) Resp:  [18-20] 20 (05/12 0553) BP: (93-119)/(55-72) 93/57 mmHg (05/12 0553) SpO2:  [97 %-100 %] 97 % (05/12 0553) Weight:  [202 lb 8 oz (91.853 kg)] 202 lb 8 oz (91.853 kg) (05/12 0553)  202 lb 8 oz (91.853 kg) Body mass index is 34.57 kg/(m^2).  Weight change: -12 lb 7.2 oz (-5.647 kg) Last BM Date: 09/19/12  Intake/Output from previous day: 05/11 0701 - 05/12 0700 In: 240 [P.O.:240] Out: 4800 [Urine:4800] Total I/O since admission:  -7.1 L.  Weight: Decreased 3.5 kg from hospital peak  General- Well developed; no acute distress; moderately overweight; sallow complexion Neck- mild to moderate JVD, no carotid bruits Lungs- clear lung fields; normal I:E ratio Cardiovascular- normal PMI; normal S1 and S2; grade 2/6 systolic ejection murmur at the left costal margin Abdomen- normal bowel sounds; soft and non-tender without masses or organomegaly Skin- Warm, no significant lesions Extremities- Nl distal pulses; 1+ edema  Lab Results: CBC:    Recent Labs  09/19/12 0540 09/20/12 0440  WBC 6.8  --   HGB 7.9* 8.0*  HCT 24.7*  --   PLT 248  --    BMET:   Recent Labs  09/19/12 0540 09/20/12 0440  NA 139 139  K 4.4 3.9  CL 102 104  CO2 28 32  GLUCOSE 87 70  BUN 14 13  CREATININE 0.53 0.53  CALCIUM 7.6* 7.4*     Echocardiogram 08/2012: Nl EF; moderate MR  Imaging Studies/Results: Dg Chest 2 View  09/17/2012   Stable bilateral pulmonary air space disease and cardiomegaly.       Imaging: Imaging results have been  reviewed  Medications:  I have reviewed the patient's current medications. Scheduled: . antiseptic oral rinse  15 mL Mouth Rinse BID  . aspirin EC  81 mg Oral q morning - 10a  . buPROPion  150 mg Oral q morning - 10a  . ciprofloxacin  500 mg Oral BID  . clonazePAM  0.25 mg Oral BID  . dronabinol  2.5 mg Oral BID AC  . escitalopram  20 mg Oral QHS  . feeding supplement (OSMOLITE 1.5 CAL)  237 mL Per Tube Custom  . feeding supplement (OSMOLITE 1.5 CAL)  360 mL Per Tube BID WC  . furosemide  80 mg Intravenous Q8H  . insulin aspart  0-9 Units Subcutaneous TID WC  . letrozole  2.5 mg Oral Daily  . levothyroxine  125 mcg Oral QAC breakfast  . metoprolol tartrate  25 mg Oral BID  . multivitamin with minerals  1 tablet Oral Daily  . OLANZapine  2.5 mg Oral QHS  . potassium chloride  20 mEq Oral BID  . spironolactone  25 mg Oral Daily  . Warfarin - Pharmacist Dosing Inpatient   Does not apply q1800     Assessment/Plan: Congestive heart failure with preserved LV systolic function: I&O and weights are now consistent and indicate significant diuresis. She has diuresed 4.5 liters yesterday.  Will change to PO lasix 40 BID.   Continue to watch I/Os.  Check BMP daily.    Atrial fibrillation: Currently sinus rhythm; amiodarone discontinued; adequate anticoagulation.  Hypokalemia: Improved with initiation of spironolactone.  Dose of potassium replacement will be decreased.   LOS: 7 days   [name]. 09/20/2012, 8:00 AM

## 2012-09-20 NOTE — Progress Notes (Signed)
Occupational Therapy Note  Patient Details  Name: RAMLA HASE MRN: [id number] Date of Birth: 1956/02/18 Today's Date: 09/20/2012  Time: 10:33-11:07am (34 min.) Pt seen for 1:1 OT session with focus on sit <> stands, transfers and activity tolerance. Pt sitting in recliner, fatigued upon arrival, husband present. Pt agreed to go to ADL apartment to rehearse tub bench transfer. Increased time for activities and transfers and pt fatigued and required frequent rest breaks. Max A for all transfers. Pt transferred to tub bench with walker from wheelchair. Did not attempt bringing LE's into tub yet, secondary to time constraints. Once back in room, pt transferred back to recliner, with LE's raised, call bell within reach and husband in room. No pain reported by pt.    Jazzmine Kleiman Hessie Diener 09/20/2012, 12:31 PM

## 2012-09-21 ENCOUNTER — Inpatient Hospital Stay (HOSPITAL_COMMUNITY): Payer: 59

## 2012-09-21 ENCOUNTER — Inpatient Hospital Stay (HOSPITAL_COMMUNITY): Payer: 59 | Admitting: Occupational Therapy

## 2012-09-21 ENCOUNTER — Inpatient Hospital Stay (HOSPITAL_COMMUNITY): Payer: 59 | Admitting: *Deleted

## 2012-09-21 DIAGNOSIS — E876 Hypokalemia: Secondary | ICD-10-CM

## 2012-09-21 LAB — PROTIME-INR
INR: 2.25 — ABNORMAL HIGH (ref 0.00–1.49)
Prothrombin Time: 23.9 seconds — ABNORMAL HIGH (ref 11.6–15.2)

## 2012-09-21 LAB — BASIC METABOLIC PANEL
CO2: 31 mEq/L (ref 19–32)
Calcium: 7.7 mg/dL — ABNORMAL LOW (ref 8.4–10.5)
Creatinine, Ser: 0.53 mg/dL (ref 0.50–1.10)
GFR calc non Af Amer: >90 mL/min (ref >90)
Sodium: 141 mEq/L (ref 135–145)

## 2012-09-21 LAB — GLUCOSE, CAPILLARY
Glucose-Capillary: 90 mg/dL (ref 70–99)
Glucose-Capillary: 103 mg/dL — ABNORMAL HIGH (ref 70–99)
Glucose-Capillary: 102 mg/dL — ABNORMAL HIGH (ref 70–99)

## 2012-09-21 LAB — OCCULT BLOOD X 1 CARD TO LAB, STOOL: Fecal Occult Bld: NEGATIVE

## 2012-09-21 MED ORDER — DRONABINOL 2.5 MG PO CAPS
5.0000 mg | ORAL_CAPSULE | Freq: Two times a day (BID) | ORAL | Status: DC
  Administered 2012-09-21 – 2012-09-25 (×10): 5 mg via ORAL
  Filled 2012-09-21: qty 1
  Filled 2012-09-21: qty 2
  Filled 2012-09-21: qty 1
  Filled 2012-09-21: qty 2
  Filled 2012-09-21: qty 1
  Filled 2012-09-21: qty 2
  Filled 2012-09-21 (×3): qty 1
  Filled 2012-09-21 (×2): qty 2
  Filled 2012-09-21 (×3): qty 1
  Filled 2012-09-21: qty 2

## 2012-09-21 MED ORDER — WARFARIN SODIUM 3 MG PO TABS
3.0000 mg | ORAL_TABLET | Freq: Once | ORAL | Status: AC
  Administered 2012-09-21: 3 mg via ORAL
  Filled 2012-09-21: qty 1

## 2012-09-21 NOTE — Progress Notes (Signed)
Subjective/Complaints: Feels well. Denies sob or anxiety today  A 12 point review of systems has been performed and if not noted above is otherwise negative.   Objective: Vital Signs: Blood pressure 115/49, pulse 71, temperature 98.1 F (36.7 C), temperature source Oral, resp. rate 17, height 5' 4.17" (1.63 m), weight 89.948 kg (198 lb 4.8 oz), SpO2 98.00%. Dg Chest Port 1 View  09/19/2012  *RADIOLOGY REPORT*  Clinical Data: Pneumonia and history of breast carcinoma.  PORTABLE CHEST - 1 VIEW  Comparison: 09/17/2012  Findings: Lung expansion has improved bilaterally with residual prominent infiltrates bilaterally.  Density of infiltrates in the upper lung zones appear slightly improved.  No significant pleural effusions are identified.  Heart size is stable.  There is stable positioning of a Port-A-Cath.  IMPRESSION: Improved expansion of both lungs with decreased density of upper zone infiltrates.   Original Report Authenticated By: Irish Lack, M.D.     Recent Labs  09/19/12 0540 09/20/12 0440  WBC 6.8  --   HGB 7.9* 8.0*  HCT 24.7*  --   PLT 248  --     Recent Labs  09/20/12 0440 09/21/12 0609  NA 139 141  K 3.9 3.8  CL 104 105  GLUCOSE 70 71  BUN 13 12  CREATININE 0.53 0.53  CALCIUM 7.4* 7.7*   CBG (last 3)   Recent Labs  09/20/12 2021 09/21/12 0711 09/21/12 0731  GLUCAP 97 67* 68*    Wt Readings from Last 3 Encounters:  09/21/12 89.948 kg (198 lb 4.8 oz)  09/08/12 97.977 kg (216 lb)  09/06/12 97.6 kg (215 lb 2.7 oz)    Physical Exam:  Constitutional: She is oriented to person, place, and time. She appears well-developed. Appears fatigued. obese  HENT: oral mucosa dry. Crusted/blood at corners of mouth. Dry blood on cover. Head: Normocephalic and atraumatic.  Eyes: Pupils are equal, round, and reactive to light.  Neck: Normal range of motion.  Cardiovascular: Normal rate and regular rhythm. No murmurs  Pulmonary/Chest: breathing comfortably. Still with  Rales bilaterally Abdominal: Soft. Bowel sounds are normal. She exhibits no distension. There is no tenderness.  PEG in place without drainage.  Musculoskeletal: She exhibits edema (BLE with tr to 1+).  Foam dressing bilateral feet. --no break down.  Neurological: She is alert and oriented to person, place, and time.    Cognitively with basic insight and awareness. mild STM deficits. Motor: 4/5 in bilateral deltoid, biceps, triceps3 minus/5 in bilateral hip flexor knee extensor ankle dorsiflexor and plantar flexor  Sensory to light touch is intact in the upper and lower limbs  Speech is clear. No language deficits Psych: pleasant but anxious.     Assessment/Plan: 1. Functional deficits secondary to deconditioning/FTT after breast cancer and associate complications  which require 3+ hours per day of interdisciplinary therapy in a comprehensive inpatient rehab setting. Physiatrist is providing close team supervision and 24 hour management of active medical problems listed below. Physiatrist and rehab team continue to assess barriers to discharge/monitor patient progress toward functional and medical goals.    FIM: FIM - Bathing Bathing Steps Patient Completed: Chest;Right Arm;Left Arm;Abdomen;Front perineal area;Right upper leg;Left upper leg Bathing: 0: Activity did not occur  FIM - Upper Body Dressing/Undressing Upper body dressing/undressing steps patient completed: Thread/unthread right sleeve of pullover shirt/dresss;Thread/unthread left sleeve of pullover shirt/dress;Put head through opening of pull over shirt/dress;Pull shirt over trunk Upper body dressing/undressing: 0: Activity did not occur FIM - Lower Body Dressing/Undressing Lower body dressing/undressing steps patient  completed: Don/Doff right sock;Don/Doff left sock Lower body dressing/undressing: 0: Activity did not occur  FIM - Toileting Toileting steps completed by patient: Performs perineal hygiene Toileting  Assistive Devices: Grab bar or rail for support Toileting: 2: Max-Patient completed 1 of 3 steps  FIM - Diplomatic Services operational officer Devices: Grab bars Toilet Transfers: 3-From toilet/BSC: Mod A (lift or lower assist)  FIM - Banker Devices: Arm rests (recliner <> wheelchair) Bed/Chair Transfer: 4: Supine > Sit: Min A (steadying Pt. > 75%/lift 1 leg);3: Sit > Supine: Mod A (lifting assist/Pt. 50-74%/lift 2 legs);3: Bed > Chair or W/C: Mod A (lift or lower assist);3: Chair or W/C > Bed: Mod A (lift or lower assist)  FIM - Locomotion: Wheelchair Locomotion: Wheelchair: 1: Total Assistance/staff pushes wheelchair (Pt<25%) FIM - Locomotion: Ambulation Locomotion: Ambulation Assistive Devices: Designer, industrial/product Ambulation/Gait Assistance: 4: Min assist Locomotion: Ambulation: 1: Travels less than 50 ft with minimal assistance (Pt.>75%)  Comprehension Comprehension Mode: Auditory Comprehension: 5-Follows basic conversation/direction: With extra time/assistive device  Expression Expression Mode: Verbal Expression: 5-Expresses basic needs/ideas: With extra time/assistive device  Social Interaction Social Interaction: 5-Interacts appropriately 90% of the time - Needs monitoring or encouragement for participation or interaction.  Problem Solving Problem Solving: 5-Solves complex 90% of the time/cues < 10% of the time  Memory Memory: 5-Recognizes or recalls 90% of the time/requires cueing < 10% of the time  Medical Problem List and Plan:  1. DVT Prophylaxis/Anticoagulation: On coumadin for atrial fibrillation.  2. Pain Management: Morphine prn  3. Depression: Team to provide ego support. To continue Wellbutrin, Lexapro and Zyprexa. Will have LCSW follow for support. neuropsych eval pending   -klonopin helpful  for anxiety 4. Neuropsych: This patient is capable of making decisions on her own behalf.  5. HTN: Monitor on bid basis.  Lasix, lopressor 6. OSA: continue CPAP at nights.  7. Cirrhosis of liver-autoimmune?:  8. Atrial Fibrillation with RVR/CHF: Will continue to monitor HR on bid basis. Has been off Cardizem. Continue metoprolol. Amiodarone per cards  -continue to diurese per cardiology recs  -foley continues  -follow up cxr tomorrow  -appreciate cardiology assist  -proteint malnutrition 9.Anemia: remains around 8  -continue to follow serially   10. DM type 2-diet controlled: Regular diet for now to help with food choices and appetite. Continue TF 6 X day and monitor intake.  11. Breast cancer: On Femara. For XRT once stronger. Patient to follow up with Dr. Michell Heinrich aftert discharge 12. Diarrhea:  -resolved  -rare BM  -dc scheduled immodium 13. FEN-   -TF as above  Increase marinol 14. Klebsiella UTI- keflex for 5-7 days  LOS (Days) 8 A FACE TO FACE EVALUATION WAS PERFORMED  Jocelyn Lowery T 09/21/2012 8:08 AM

## 2012-09-21 NOTE — Progress Notes (Signed)
Pt. Has home CPAP set up in room and requires no assistance from RT at this time.

## 2012-09-21 NOTE — Progress Notes (Signed)
NUTRITION FOLLOW UP  DOCUMENTATION CODES  Per approved criteria   -Severe malnutrition in the context of chronic illness    Intervention:   1. Continue 360 ml Osmolite 1.5 BID and 240 ml Osmolite 1.5 BID, a total of 5 cans daily. Total regimen provides: 1775 kcal, 75 grams protein, 905 ml free water, 1650 mg of sodium. 2. RD to continue to follow nutrition care plan  Nutrition Dx:   Inadequate oral intake related to poor appetite as evidenced by pt with PEG and TF as main source of nutrition. Ongoing.  Goal:   Intake to meet at least 90% of estimated needs. Met.  Monitor:   weight trends, lab trends, I/O's, PO intake, TF tolerance  Assessment:   Pt with hx of metastatic breast CA s/p right mastectomy, DM, HTN with progressively worsening weakness since diagnosis and treatment of CA in January 2014. Admitted to rehab with deconditioning d/t multiple medical issues. Pt with history of severe d/v/n, very limited PO intake. Pt with no desire to eat x 2 months. Pt had PEG placed during previous admission and pt was started on continuous infusion of Jevity 1.2 then switched to bolus TF. Pt was getting of Jevity 1.2 via PEG 4 times/day PTA which provides 1728 calories, 80 g protein, 1162 ml free water. Pt reports she was tolerating this TF well at home without n/v.  Current regimen is 360 ml Osmolite 1.5 BID and 240 ml Osmolite 1.5 BID.  Working towards d/c date of 5/20. MD added marinol 5/8 for appetite (pt has tried megace in the past.) Pt noted to have dyspnea and worsening BLE edema. Work-up reveals fluid overload/CHF; lasix resumed. RD discussed oral intake with patient - she reports that she cannot and is not trying to eat anything.  Continues with Regular diet order, 1 L fluid restriction, 2 gram sodium. RN reports that pt is continuing to not eat - husband consumes meal trays.  Pt meets criteria for SEVERE MALNUTRITION in the context of chronic as evidenced by <75% of estimated  energy requirements for 2 months and edema.  Height: Ht Readings from Last 1 Encounters:  09/19/12 5' 4.17" (1.63 m)    Weight Status:   Wt Readings from Last 1 Encounters:  09/21/12 198 lb 4.8 oz (89.948 kg)  Wt is trending down (admit wt 217 lb --> net I/O's is -17 liters since admission)  Re-estimated needs:  Kcal: 1650 - 1950 Protein: 85 - 100 grams Fluid: 1.8 - 2 liters daily  Skin: stage II on L and R buttocks  Diet Order: General   Intake/Output Summary (Last 24 hours) at 09/21/12 1245 Last data filed at 09/21/12 0500  Gross per 24 hour  Intake    120 ml  Output   2550 ml  Net  -2430 ml    Last BM: 5/13   Labs:   Recent Labs Lab 09/19/12 0540 09/20/12 0440 09/21/12 0609  NA 139 139 141  K 4.4 3.9 3.8  CL 102 104 105  CO2 28 32 31  BUN 14 13 12   CREATININE 0.53 0.53 0.53  CALCIUM 7.6* 7.4* 7.7*  GLUCOSE 87 70 71    CBG (last 3)   Recent Labs  09/21/12 0711 09/21/12 0731 09/21/12 0812  GLUCAP 67* 68* 90    Scheduled Meds: . antiseptic oral rinse  15 mL Mouth Rinse BID  . buPROPion  150 mg Oral q morning - 10a  . cephALEXin  250 mg Oral Q8H  .  clonazePAM  0.25 mg Oral BID  . dronabinol  5 mg Oral BID AC  . escitalopram  20 mg Oral QHS  . feeding supplement (OSMOLITE 1.5 CAL)  237 mL Per Tube Custom  . feeding supplement (OSMOLITE 1.5 CAL)  360 mL Per Tube BID WC  . furosemide  40 mg Oral BID  . insulin aspart  0-9 Units Subcutaneous TID WC  . letrozole  2.5 mg Oral Daily  . levothyroxine  125 mcg Oral QAC breakfast  . metoprolol tartrate  25 mg Oral BID  . multivitamin with minerals  1 tablet Oral Daily  . OLANZapine  2.5 mg Oral QHS  . potassium chloride  20 mEq Oral BID  . spironolactone  25 mg Oral Daily  . warfarin  3 mg Oral ONCE-1800  . Warfarin - Pharmacist Dosing Inpatient   Does not apply q1800    Continuous Infusions:  none  Jarold Motto MS, RD, LDN Pager: [phone] After-hours pager: [phone]

## 2012-09-21 NOTE — Progress Notes (Signed)
Physical Therapy Session Note  Patient Details  Name: [name] MRN: [id number] Date of Birth: Oct 07, 1955  Today's Date: 09/21/2012 Time: 7829-5621 Time Calculation (min): 42 min  Short Term Goals: Week 1:  PT Short Term Goal 1 (Week 1): Pt will be able to complete bed mobility with mod A using adaptive equipment if needed PT Short Term Goal 2 (Week 1): Pt will be able to complete sit to stands consistently with min A PT Short Term Goal 3 (Week 1): Pt will be able to gait x 50' with min A PT Short Term Goal 4 (Week 1): Pt will be able to go up/down 2 steps for home entry with mod A  Skilled Therapeutic Interventions/Progress Updates:   Started session with w/c propulsion down hallway x 65' with S and extra time; cues for obstacle negotiation technique to turn. Simulated car transfer (using RW to go short distance gait with steady A to/from w/c) with min A to transfer into the car; assist needed with BLE despite use of leg lifter to bring in/out and mod A for sit to stand out of car due to lower surface. Discussed with pt having the seat pushed back as far as it goes to increase room for LE's. Practiced bed mobility on elevated mat using leg lifter to aid with managing LE's and up to mod A still needed; pt difficulty bringing LE's onto the bed min A for supine to sit. Rest breaks as needed due to fatigue but O2 on 1L remaining >94%.   Therapy Documentation Precautions:  Precautions Precautions: Fall Precaution Comments: Monitor HR Restrictions Weight Bearing Restrictions: No Vital Signs: Therapy Vitals Pulse Rate: 73 Oxygen Therapy SpO2: 95 % O2 Device: Nasal cannula O2 Flow Rate (L/min): 1 L/min Pain: Premedicated for back pain.  Locomotion : Ambulation Ambulation/Gait Assistance: 4: Min assist   See FIM for current functional status  Therapy/Group: Individual Therapy  [name] [facility] 09/21/2012, 10:15 AM

## 2012-09-21 NOTE — Progress Notes (Signed)
Physical Therapy Weekly Progress Note  Patient Details  Name: [name] MRN: [id number] Date of Birth: 1955/07/22  Today's Date: 09/21/2012  Patient has met 1 of 4 short term goals.  Pt has made slow functional gains due to medical issues limiting tolerance end of last week. Pt is currently min to mod A with transfers and min A with gait short distances. Anxiety is improving but pt mood/anxiety/depression also limiting patient with therapy. Plan is to d/c home with husband and daughter with goals at mostly S to min A level at this time. Recommended ramp for home entry and per pt report, husband has it set up to be put in this weekend.  Patient continues to demonstrate the following deficits: decreased activity tolerance, decreased muscular endurance, decreased strength, decreased balance,  and therefore will continue to benefit from skilled PT intervention to enhance overall performance with activity tolerance, balance, ability to compensate for deficits and muscular endurance/strength.  Patient progressing toward long term goals..  Plan of care revisions: Discharged stair goal as a ramp is being built for home entry. Decreased gait distance for goal as well due to low activity tolerance.. Continue to monitor rest of the week to determine if pt may need more min A than S for transfers/gait.  PT Short Term Goals Week 1:  PT Short Term Goal 1 (Week 1): Pt will be able to complete bed mobility with mod A using adaptive equipment if needed PT Short Term Goal 1 - Progress (Week 1): Met PT Short Term Goal 2 (Week 1): Pt will be able to complete sit to stands consistently with min A PT Short Term Goal 2 - Progress (Week 1): Partly met (min to mod A) PT Short Term Goal 3 (Week 1): Pt will be able to gait x 50' with min A PT Short Term Goal 3 - Progress (Week 1): Partly met (min A but < 50') PT Short Term Goal 4 (Week 1): Pt will be able to go up/down 2 steps for home entry with mod A PT Short Term  Goal 4 - Progress (Week 1): Revised due to lack of progress (plan to have ramp for home access) Week 2:  PT Short Term Goal 1 (Week 2): = LTGs  Skilled Therapeutic Interventions/Progress Updates:  Ambulation/gait training;Balance/vestibular training;Community reintegration;Discharge planning;Disease management/prevention;DME/adaptive equipment instruction;Functional mobility training;Neuromuscular re-education;Pain management;Patient/family education;Psychosocial support;Skin care/wound management;Splinting/orthotics;Stair training;Therapeutic Activities;Therapeutic Exercise;UE/LE Strength taining/ROM;UE/LE Coordination activities;Wheelchair propulsion/positioning   Therapy Documentation Precautions:  Precautions Precautions: Fall Precaution Comments: Monitor HR Restrictions Weight Bearing Restrictions: No   See FIM for current functional status   [name] [facility] [place] 09/21/2012, 4:40 PM

## 2012-09-21 NOTE — Progress Notes (Signed)
Occupational Therapy Session Note  Patient Details  Name: [name] MRN: [id number] Date of Birth: 03-11-1956  Today's Date: 09/21/2012 Time: 1130-1200 Time Calculation (min): 30 min  Short Term Goals: Week 1:  OT Short Term Goal 1 (Week 1): Patient will perform toilet transfer with moderate assistance using DME & AD prn OT Short Term Goal 2 (Week 1): Patient will bathe 10/10 body parts with moderate assistance in sit<>stand position using AE prn OT Short Term Goal 3 (Week 1): Patient will perform LB dsg (pants only) with minimal assistance in sit<>stand position OT Short Term Goal 4 (Week 1): Patient will donn shirt with minimal assistance, seated un-supported OT Short Term Goal 5 (Week 1): AE for LB ADLs will be introduced to patient  Skilled Therapeutic Interventions/Progress Updates:    Therapy session focused on functional transfers, standing balance, and activity tolerance. Pt completed several sit<>stand transfers with mod assist w/c to RW. Tolerated standing for up to 2:30 on several occasions while participating in functional task of folding clothing. Pt required steadying assist while in standing during activities. O2 sats remained 94-96% with pt on 1L O2. Pt educated on pursed lip breathing.   Therapy Documentation Precautions:  Precautions Precautions: Fall Precaution Comments: Monitor HR Restrictions Weight Bearing Restrictions: No General:   Vital Signs: Therapy Vitals Pulse Rate: 66 BP: 102/54 mmHg Oxygen Therapy SpO2: 97 % O2 Device: Nasal cannula O2 Flow Rate (L/min): 1 L/min Pain:   No c/o pain during therapy session.  ADL:   Exercises:   Other Treatments:    See FIM for current functional status  Therapy/Group: Individual Therapy  Daneil Dan 09/21/2012, 12:02 PM

## 2012-09-21 NOTE — Progress Notes (Signed)
ANTICOAGULATION CONSULT NOTE - Follow Up Consult  Pharmacy Consult for coumadin Indication: atrial fibrillation  Allergies  Allergen Reactions  . Olmesartan Medoxomil Cough    REACTION: ? if cough  . Tetracycline Hcl     Unknown reaction, too long for patient to remember   . Venlafaxine     REACTION: severe dry moouth  . Adhesive (Tape) Rash    Patient Measurements: Height: 5' 4.17" (163 cm) Weight: 198 lb 4.8 oz (89.948 kg) IBW/kg (Calculated) : 55.1 Heparin Dosing Weight:   Vital Signs: Temp: 98.1 F (36.7 C) (05/13 0500) Temp src: Oral (05/13 0500) BP: 115/49 mmHg (05/13 0500) Pulse Rate: 71 (05/13 0500)  Labs:  Recent Labs  09/19/12 0540 09/20/12 0440 09/21/12 0609  HGB 7.9* 8.0*  --   HCT 24.7*  --   --   PLT 248  --   --   LABPROT 22.1* 22.4* 23.9*  INR 2.03* 2.06* 2.25*  CREATININE 0.53 0.53 0.53    Estimated Creatinine Clearance: 83.5 ml/min (by C-G formula based on Cr of 0.53).   Medications:  Scheduled:  . antiseptic oral rinse  15 mL Mouth Rinse BID  . buPROPion  150 mg Oral q morning - 10a  . cephALEXin  250 mg Oral Q8H  . clonazePAM  0.25 mg Oral BID  . dronabinol  5 mg Oral BID AC  . escitalopram  20 mg Oral QHS  . feeding supplement (OSMOLITE 1.5 CAL)  237 mL Per Tube Custom  . feeding supplement (OSMOLITE 1.5 CAL)  360 mL Per Tube BID WC  . furosemide  40 mg Oral BID  . insulin aspart  0-9 Units Subcutaneous TID WC  . letrozole  2.5 mg Oral Daily  . levothyroxine  125 mcg Oral QAC breakfast  . metoprolol tartrate  25 mg Oral BID  . multivitamin with minerals  1 tablet Oral Daily  . OLANZapine  2.5 mg Oral QHS  . potassium chloride  20 mEq Oral BID  . spironolactone  25 mg Oral Daily  . [COMPLETED] warfarin  3 mg Oral ONCE-1800  . Warfarin - Pharmacist Dosing Inpatient   Does not apply q1800  . [DISCONTINUED] aspirin EC  81 mg Oral q morning - 10a  . [DISCONTINUED] ciprofloxacin  500 mg Oral BID  . [DISCONTINUED] dronabinol  2.5 mg  Oral BID AC   Infusions:    Assessment: 57 yo female with history of afib is currently on therapeutic coumadin.  INR up to 2.25 from 2.06.  Off cipro now. Goal of Therapy:  INR 2-3    Plan:  1) Coumadin 3mg  po x1 2) INR in am  Fionnuala Hemmerich, Tsz-Yin 09/21/2012,8:23 AM

## 2012-09-21 NOTE — Progress Notes (Signed)
[name]  57 y.o.  female  Subjective: Denies dyspnea or chest discomfort.   Mild dizziness upon standing which is stable.  Allergy: Olmesartan medoxomil; Tetracycline hcl; Venlafaxine; and Adhesive  Objective: Vital signs in last 24 hours: Temp:  [97.8 F (36.6 C)-98.1 F (36.7 C)] 98.1 F (36.7 C) (05/13 0500) Pulse Rate:  [69-84] 71 (05/13 0500) Resp:  [17-18] 17 (05/13 0500) BP: (105-115)/(48-58) 115/49 mmHg (05/13 0500) SpO2:  [98 %-100 %] 98 % (05/13 0500) Weight:  [198 lb 4.8 oz (89.948 kg)] 198 lb 4.8 oz (89.948 kg) (05/13 0500)  198 lb 4.8 oz (89.948 kg) Body mass index is 33.85 kg/(m^2).  Weight change: -4 lb 3.2 oz (-1.905 kg) Last BM Date: 09/19/12  Intake/Output from previous day: 05/12 0701 - 05/13 0700 In: 240 [P.O.:240] Out: 5460 [Urine:5400; Drains:60]  General- Well developed; no acute distress; moderately overweight; sallow complexion OP clear Neck- mild to moderate JVD, no carotid bruits Lungs- clear lung fields; normal I:E ratio Cardiovascular- normal PMI; normal S1 and S2; grade 2/6 systolic ejection murmur at the left costal margin Abdomen- normal bowel sounds; soft and non-tender without masses or organomegaly Skin- Warm, no significant lesions Extremities- Nl distal pulses; 1+ edema  Lab Results: CBC:    Recent Labs  09/19/12 0540 09/20/12 0440  WBC 6.8  --   HGB 7.9* 8.0*  HCT 24.7*  --   PLT 248  --    BMET:   Recent Labs  09/20/12 0440 09/21/12 0609  NA 139 141  K 3.9 3.8  CL 104 105  CO2 32 31  GLUCOSE 70 71  BUN 13 12  CREATININE 0.53 0.53  CALCIUM 7.4* 7.7*   Echocardiogram 08/2012: Nl EF; moderate MR  Medications:  I have reviewed the patient's current medications. Scheduled: . antiseptic oral rinse  15 mL Mouth Rinse BID  . buPROPion  150 mg Oral q morning - 10a  . cephALEXin  250 mg Oral Q8H  . clonazePAM  0.25 mg Oral BID  . dronabinol  2.5 mg Oral BID AC  . escitalopram  20 mg Oral QHS  . feeding  supplement (OSMOLITE 1.5 CAL)  237 mL Per Tube Custom  . feeding supplement (OSMOLITE 1.5 CAL)  360 mL Per Tube BID WC  . furosemide  40 mg Oral BID  . insulin aspart  0-9 Units Subcutaneous TID WC  . letrozole  2.5 mg Oral Daily  . levothyroxine  125 mcg Oral QAC breakfast  . metoprolol tartrate  25 mg Oral BID  . multivitamin with minerals  1 tablet Oral Daily  . OLANZapine  2.5 mg Oral QHS  . potassium chloride  20 mEq Oral BID  . spironolactone  25 mg Oral Daily  . Warfarin - Pharmacist Dosing Inpatient   Does not apply q1800     Assessment/Plan: Congestive heart failure with preserved LV systolic function: I&O and weights indicate significant diuresis.  Continue lasix 40 BID.   May decrease to 40mg  daily in next 1-2 days  Continue to watch I/Os.  BMET is pending  Atrial fibrillation: Continue coumadin, stop ASA  Hypokalemia: bmet is pendiing   LOS: 8 days   Hillis Range 09/21/2012, 8:02 AM

## 2012-09-21 NOTE — Progress Notes (Signed)
Occupational Therapy Session Notes  Patient Details  Name: [name] MRN: [id number] Date of Birth: 29-Apr-1956  Today's Date: 09/21/2012  Short Term Goals: Week 1:  OT Short Term Goal 1 (Week 1): Patient will perform toilet transfer with moderate assistance using DME & AD prn OT Short Term Goal 2 (Week 1): Patient will bathe 10/10 body parts with moderate assistance in sit<>stand position using AE prn OT Short Term Goal 3 (Week 1): Patient will perform LB dsg (pants only) with minimal assistance in sit<>stand position OT Short Term Goal 4 (Week 1): Patient will donn shirt with minimal assistance, seated un-supported OT Short Term Goal 5 (Week 1): AE for LB ADLs will be introduced to patient  Skilled Therapeutic Interventions/Progress Updates:   Session #1 6295-2841 - 55 Minutes Individual Therapy Patient with 4/10 complaints of pain in lower back, RN made aware  Patient found supine in bed. Therapist donned bilateral TEDs. Patient then engaged in bed mobility with min assist and ambulated -> w/c at sink side for ADL retraining of UB/LB bathing & dressing and grooming tasks. After ADL focused skilled intervention on functional mobility/ambulation using rolling walker and overall activity tolerance/endurance. Patient continues to present anxious during therapy sessions. 02 sats checked throughout session and patient stayed around 88% on room air with & without activity. Donned nasal cannula with 1 liter when sats >90%. At end of session, left patient seated in w/c with BLEs elevated and call bell & phone within reach. Husband present in room as well.   Session #2 1305-1400 - 55 Minutes Individual Therapy No complaints of pain Patient found seated in w/c. Therapist propelled patient from room -> therapy gym for endurance exercise on NuStep on load level 3-5 for 15 minutes (increased load level as time increased). Patient required 3 rest breaks during 15 minute NuStep exercise. Afterwards,  patient ambulated -> ADL apartment using rolling walker with min assist for furniture transfer onto couch. After 5 minute rest break, patient ambulated -> bathroom for tub/shower transfer on/off tub transfer bench with moderate assistance from therapist. Patient then transferred back into w/c and therapist propelled patient back to room. Patient left seated in recliner at end of session with call bell & phone within reach. Patient on room air throughout session, 02 sats were 92%-93%.   Precautions:  Precautions Precautions: Fall Precaution Comments: Monitor HR Restrictions Weight Bearing Restrictions: No  See FIM for current functional status  Johnel Yielding 09/21/2012, 7:27 AM

## 2012-09-21 NOTE — Progress Notes (Signed)
Recreational Therapy Session Note  Patient Details  Name: [name] MRN: [id number] Date of Birth: 1955-09-17 Today's Date: 09/21/2012 Time:  1191-4782 Pain: no c/o Skilled Therapeutic Interventions/Progress Updates: Per pt request on eval, session focused on relaxation training.  Pt seen seated in w/c for aromatherapy & progressive muscle relaxation with supervision.  Discussed use of essential oils with pt including type of oil (lavender), implementation of use, side effects if any related to its use.  Pt stated understanding and agreeable to use.   Room darkened & quieted, incorporated relaxing music.  LRT/CTRS applied 2 drops of lavender to gauze pad and placed it on ledge of sink during progressive muscle relaxation activity.  Using Relaxation rating form, pt rated stress/anxiety 7/10 both at the beginning of session and at the sessions conclusion.  After sessions completion, pt described her mood as relaxed & energized and stated that she experienced feelings of pleasure, calmness, peacefulness, & happiness.  Pt also stated sensations of warmth, looseness, feeling far away, mentally quiet, & mellow.  Pt reports session was helpful and would like to have similar sessions offered at least 2 times per week.    Therapy/Group: Individual Therapy Juliani Laduke 09/21/2012, 11:39 AM

## 2012-09-22 ENCOUNTER — Inpatient Hospital Stay (HOSPITAL_COMMUNITY): Payer: 59 | Admitting: Physical Therapy

## 2012-09-22 ENCOUNTER — Inpatient Hospital Stay (HOSPITAL_COMMUNITY): Payer: 59 | Admitting: Occupational Therapy

## 2012-09-22 ENCOUNTER — Inpatient Hospital Stay (HOSPITAL_COMMUNITY): Payer: 59

## 2012-09-22 ENCOUNTER — Inpatient Hospital Stay (HOSPITAL_COMMUNITY): Payer: 59 | Admitting: *Deleted

## 2012-09-22 LAB — BASIC METABOLIC PANEL
CO2: 26 mEq/L (ref 19–32)
Chloride: 107 mEq/L (ref 96–112)
Creatinine, Ser: 0.55 mg/dL (ref 0.50–1.10)
Potassium: 4.3 mEq/L (ref 3.5–5.1)
Sodium: 141 mEq/L (ref 135–145)

## 2012-09-22 LAB — GLUCOSE, CAPILLARY
Glucose-Capillary: 88 mg/dL (ref 70–99)
Glucose-Capillary: 122 mg/dL — ABNORMAL HIGH (ref 70–99)

## 2012-09-22 LAB — PROTIME-INR: INR: 2.69 — ABNORMAL HIGH (ref 0.00–1.49)

## 2012-09-22 MED ORDER — FUROSEMIDE 40 MG PO TABS
40.0000 mg | ORAL_TABLET | Freq: Every day | ORAL | Status: DC
  Administered 2012-09-22 – 2012-09-28 (×7): 40 mg via ORAL
  Filled 2012-09-22 (×8): qty 1

## 2012-09-22 MED ORDER — WARFARIN SODIUM 2 MG PO TABS
2.0000 mg | ORAL_TABLET | Freq: Once | ORAL | Status: AC
  Administered 2012-09-22: 2 mg via ORAL
  Filled 2012-09-22: qty 1

## 2012-09-22 NOTE — Progress Notes (Signed)
Pt. Has her home CPAP & mask set up at the bedside. Pt. Stated that she would place herself on CPAP before going to bed. Pt. Was made aware to call RT if she needed assistance with CPAP.

## 2012-09-22 NOTE — Progress Notes (Signed)
    Filed Vitals:   09/21/12 1530 09/21/12 2111 09/22/12 0500 09/22/12 0546  BP: 102/53 104/49  110/63  Pulse: 75 72  66  Temp: 97.9 F (36.6 C)   98.2 F (36.8 C)  TempSrc: Oral   Oral  Resp: 20   18  Height:      Weight:   181 lb 14.4 oz (82.509 kg) 183 lb 1.6 oz (83.054 kg)  SpO2: 94%   94%    Intake/Output Summary (Last 24 hours) at 09/22/12 4098 Last data filed at 09/22/12 0411  Gross per 24 hour  Intake      0 ml  Output   2450 ml  Net  -2450 ml    SUBJECTIVE Continues good diuresis. States breathing is much better.  LABS: Basic Metabolic Panel:  Recent Labs  11/91/47 0440 09/21/12 0609  NA 139 141  K 3.9 3.8  CL 104 105  CO2 32 31  GLUCOSE 70 71  BUN 13 12  CREATININE 0.53 0.53  CALCIUM 7.4* 7.7*   Liver Function Tests:  Recent Labs  09/20/12 0440  AST 46*  ALT 26  ALKPHOS 188*  BILITOT 0.2*  PROT 5.4*  ALBUMIN 1.3*   No results found for this basename: LIPASE, AMYLASE,  in the last 72 hours CBC:  Recent Labs  09/20/12 0440  HGB 8.0*   Radiology/Studies:  Dg Chest 2 View  09/17/2012   *RADIOLOGY REPORT*  Clinical Data: Congestive heart failure.  Shortness breath. Weakness.  Cirrhosis.  Breast carcinoma.  CHEST - 2 VIEW  Comparison: 09/16/2012  Findings: Bilateral pulmonary air space disease shows no significant interval change.  Cardiomegaly remains stable. Left- sided Port-A-Cath remains in appropriate position.  IMPRESSION: Stable bilateral pulmonary air space disease and cardiomegaly.   Original Report Authenticated By: Myles Rosenthal, M.D.    PHYSICAL EXAM General: Well developed, well nourished, in no acute distress. Head: Normocephalic, atraumatic, sclera non-icteric. Neck: Negative for carotid bruits. JVD mildly elevated. Lungs: Clear bilaterally to auscultation without wheezes, rales, or rhonchi. Breathing is unlabored. Heart: RRR S1 S2 with 2/6 systolic ejection murmur LSB Abdomen: Soft, non-tender, non-distended with normoactive  bowel sounds.  Extremities: 1+ edema.  Distal pedal pulses are 2+ and equal bilaterally. Neuro: Alert and oriented X 3. Moves all extremities spontaneously. Psych:  Responds to questions appropriately with a normal affect.  ASSESSMENT AND PLAN: 1. Acute diastolic CHF. Continues to diurese very well. Weight down significantly. Still some edema. Will reduce lasix to once a day. 2. Atrial fibrillation- rate controlled. On Coumadin. 3. Hypokalemia.   BMET and INR pending this am.  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

## 2012-09-22 NOTE — Progress Notes (Addendum)
Recreational Therapy Session Note  Patient Details  Name: [name] MRN: [id number] Date of Birth: 11-29-1955 Today's Date: 09/22/2012 Time:  1005-1030: 2nd session cancelled Pain: no c/o Skilled Therapeutic Interventions/Progress Updates: Session focused on activity tolerance, standing tolerance & balance while reaching outside BOS for horseshoe activity with min assist.  2nd session:  Scheduled pt per her request for relaxation training/aromatherapy.  Pt asleep in bed, did not wake her.  Therapy/Group: Co-Treatment   Ollie Delano 09/22/2012, 4:21 PM

## 2012-09-22 NOTE — Progress Notes (Signed)
ANTICOAGULATION CONSULT NOTE - Follow Up Consult  Pharmacy Consult for coumadin Indication: atrial fibrillation  Allergies  Allergen Reactions  . Olmesartan Medoxomil Cough    REACTION: ? if cough  . Tetracycline Hcl     Unknown reaction, too long for patient to remember   . Venlafaxine     REACTION: severe dry moouth  . Adhesive (Tape) Rash    Patient Measurements: Height: 5' 4.17" (163 cm) Weight: 183 lb 1.6 oz (83.054 kg) IBW/kg (Calculated) : 55.1 Heparin Dosing Weight:   Vital Signs: Temp: 98.2 F (36.8 C) (05/14 0546) Temp src: Oral (05/14 0546) BP: 110/63 mmHg (05/14 0546) Pulse Rate: 66 (05/14 0546)  Labs:  Recent Labs  09/20/12 0440 09/21/12 0609 09/22/12 0545  HGB 8.0*  --   --   LABPROT 22.4* 23.9* 27.3*  INR 2.06* 2.25* 2.69*  CREATININE 0.53 0.53 0.55    Estimated Creatinine Clearance: 80.2 ml/min (by C-G formula based on Cr of 0.55).   Medications:  Scheduled:  . antiseptic oral rinse  15 mL Mouth Rinse BID  . buPROPion  150 mg Oral q morning - 10a  . cephALEXin  250 mg Oral Q8H  . clonazePAM  0.25 mg Oral BID  . dronabinol  5 mg Oral BID AC  . escitalopram  20 mg Oral QHS  . feeding supplement (OSMOLITE 1.5 CAL)  237 mL Per Tube Custom  . feeding supplement (OSMOLITE 1.5 CAL)  360 mL Per Tube BID WC  . furosemide  40 mg Oral Daily  . insulin aspart  0-9 Units Subcutaneous TID WC  . letrozole  2.5 mg Oral Daily  . levothyroxine  125 mcg Oral QAC breakfast  . metoprolol tartrate  25 mg Oral BID  . multivitamin with minerals  1 tablet Oral Daily  . OLANZapine  2.5 mg Oral QHS  . potassium chloride  20 mEq Oral BID  . spironolactone  25 mg Oral Daily  . Warfarin - Pharmacist Dosing Inpatient   Does not apply q1800   Infusions:    Assessment: 57 yo female with afib is currently on therapeutic coumadin.  INR however jumped from 2.25 to 2.69.  No bleeding note. Goal of Therapy:  INR 2-3    Plan:  1) Coumadin 2mg  po x1 2) INR in  am  Glenroy Crossen, Tsz-Yin 09/22/2012,8:24 AM

## 2012-09-22 NOTE — Progress Notes (Signed)
Subjective/Complaints: Generally had a good day yesterday. Slept well. Denies sob. A 12 point review of systems has been performed and if not noted above is otherwise negative.   Objective: Vital Signs: Blood pressure 110/63, pulse 66, temperature 98.2 F (36.8 C), temperature source Oral, resp. rate 18, height 5' 4.17" (1.63 m), weight 83.054 kg (183 lb 1.6 oz), SpO2 94.00%. No results found.  Recent Labs  09/20/12 0440  HGB 8.0*    Recent Labs  09/21/12 0609 09/22/12 0545  NA 141 141  K 3.8 4.3  CL 105 107  GLUCOSE 71 66*  BUN 12 11  CREATININE 0.53 0.55  CALCIUM 7.7* 7.5*   CBG (last 3)   Recent Labs  09/21/12 0812 09/21/12 1635 09/21/12 2114  GLUCAP 90 103* 102*    Wt Readings from Last 3 Encounters:  09/22/12 83.054 kg (183 lb 1.6 oz)  09/08/12 97.977 kg (216 lb)  09/06/12 97.6 kg (215 lb 2.7 oz)    Physical Exam:  Constitutional: She is oriented to person, place, and time. She appears well-developed. Appears fatigued. obese  HENT: oral mucosa dry. Crusted/blood at corners of mouth. Dry blood on cover. Head: Normocephalic and atraumatic.  Eyes: Pupils are equal, round, and reactive to light.  Neck: Normal range of motion.  Cardiovascular: Normal rate and regular rhythm. No murmurs  Pulmonary/Chest: breathing comfortably. Still with Rales bilaterally Abdominal: Soft. Bowel sounds are normal. She exhibits no distension. There is no tenderness.  PEG in place without drainage.  Musculoskeletal: She exhibits edema (BLE with 1+).  Foam dressing bilateral feet. --no break down.  Neurological: She is alert and oriented to person, place, and time.    Cognitively with basic insight and awareness. mild STM deficits. Motor: 4/5 in bilateral deltoid, biceps, triceps3 minus/5 in bilateral hip flexor knee extensor ankle dorsiflexor and plantar flexor  Sensory to light touch is intact in the upper and lower limbs  Speech is clear. No language deficits Psych:  pleasant, less anxious    Assessment/Plan: 1. Functional deficits secondary to deconditioning/FTT after breast cancer and associate complications  which require 3+ hours per day of interdisciplinary therapy in a comprehensive inpatient rehab setting. Physiatrist is providing close team supervision and 24 hour management of active medical problems listed below. Physiatrist and rehab team continue to assess barriers to discharge/monitor patient progress toward functional and medical goals.    FIM: FIM - Bathing Bathing Steps Patient Completed: Chest;Right Arm;Left Arm;Abdomen;Right upper leg;Left upper leg Bathing: 3: Mod-Patient completes 5-7 83f 10 parts or 50-74%  FIM - Upper Body Dressing/Undressing Upper body dressing/undressing steps patient completed: Thread/unthread right sleeve of pullover shirt/dresss;Thread/unthread left sleeve of pullover shirt/dress;Put head through opening of pull over shirt/dress;Pull shirt over trunk Upper body dressing/undressing: 5: Supervision: Safety issues/verbal cues FIM - Lower Body Dressing/Undressing Lower body dressing/undressing steps patient completed: Thread/unthread right pants leg;Pull pants up/down Lower body dressing/undressing: 2: Max-Patient completed 25-49% of tasks  FIM - Toileting Toileting steps completed by patient: Performs perineal hygiene Toileting Assistive Devices: Grab bar or rail for support Toileting: 0: Activity did not occur  FIM - Diplomatic Services operational officer Devices: Therapist, music Transfers: 0-Activity did not occur  FIM - Banker Devices: Arm rests;Walker (leg lifter) Bed/Chair Transfer: 3: Supine > Sit: Mod A (lifting assist/Pt. 50-74%/lift 2 legs;3: Sit > Supine: Mod A (lifting assist/Pt. 50-74%/lift 2 legs);3: Chair or W/C > Bed: Mod A (lift or lower assist);4: Bed > Chair or W/C: Min A (steadying  Pt. > 75%)  FIM - Locomotion: Wheelchair Locomotion:  Wheelchair: 2: Travels 50 - 149 ft with supervision, cueing or coaxing FIM - Locomotion: Ambulation Locomotion: Ambulation Assistive Devices: Designer, industrial/product Ambulation/Gait Assistance: 4: Min assist Locomotion: Ambulation: 1: Travels less than 50 ft with minimal assistance (Pt.>75%)  Comprehension Comprehension Mode: Auditory Comprehension: 5-Follows basic conversation/direction: With extra time/assistive device  Expression Expression Mode: Verbal Expression: 5-Expresses basic needs/ideas: With extra time/assistive device  Social Interaction Social Interaction: 5-Interacts appropriately 90% of the time - Needs monitoring or encouragement for participation or interaction.  Problem Solving Problem Solving: 5-Solves complex 90% of the time/cues < 10% of the time  Memory Memory: 5-Recognizes or recalls 90% of the time/requires cueing < 10% of the time  Medical Problem List and Plan:  1. DVT Prophylaxis/Anticoagulation: On coumadin for atrial fibrillation.  2. Pain Management: Morphine prn  3. Depression: Team to provide ego support. To continue Wellbutrin, Lexapro and Zyprexa. Will have LCSW follow for support. neuropsych eval pending   -klonopin helpful  for anxiety 4. Neuropsych: This patient is capable of making decisions on her own behalf.  5. HTN: Monitor on bid basis. Lasix, lopressor 6. OSA: continue CPAP at nights.  7. Cirrhosis of liver-autoimmune?:  8. Atrial Fibrillation with RVR/CHF: Will continue to monitor HR on bid basis. Has been off Cardizem. Continue metoprolol. Amiodarone per cards  -lasix decreased  -remove foley today  -follow up cxr today  -appreciate cardiology assist  -proteint malnutrition---TEDS for edema control, elevation 9.Anemia: remains around 8  -continue to follow serially   10. DM type 2-diet controlled: Regular diet for now to help with food choices and appetite. Continue TF 6 X day and monitor intake.  11. Breast cancer: On Femara. For XRT  once stronger. Patient to follow up with Dr. Michell Heinrich aftert discharge 12. Diarrhea:  -resolved  -rare BM  -dc scheduled immodium 13. FEN-   -TF as above  Increased marinol 14. Klebsiella UTI- keflex for 5-7 days  LOS (Days) 9 A FACE TO FACE EVALUATION WAS PERFORMED  Lance Huaracha T 09/22/2012 7:53 AM

## 2012-09-22 NOTE — Patient Care Conference (Signed)
Inpatient RehabilitationTeam Conference and Plan of Care Update Date: 09/21/2012   Time: 2:10 PM    Patient Name: [name]      Medical Record Number: [id number]  Date of Birth: 09/09/1955 Sex: Female         Room/Bed: 4008/4008-01 Payor Info: Payor: [employer] / Plan: [employer] / Product Type: *No Product type* /    Admitting Diagnosis: Deconditioning  breast ca  Admit Date/Time:  09/13/2012  4:29 PM Admission Comments: No comment available   Primary Diagnosis:  Physical deconditioning Principal Problem: Physical deconditioning  Patient Active Problem List   Diagnosis Date Noted  . Acute on chronic diastolic CHF (congestive heart failure), NYHA class 1 09/16/2012  . Physical deconditioning 09/13/2012  . DJD (degenerative joint disease) of lumbar spine   . Malnutrition 09/07/2012  . CHF, acute 08/25/2012  . Cellulitis of leg, left 08/25/2012  . Anasarca 08/24/2012  . UTI (lower urinary tract infection) 08/24/2012  . Diarrhea 08/06/2012  . Hyperkalemia 08/06/2012  . Acute renal insufficiency 07/23/2012  . Edema 07/23/2012  . A-fib 07/14/2012  . Hypokalemia 07/13/2012  . Hyponatremia 07/13/2012  . Obesity, unspecified 07/13/2012  . Severe malnutrition in the context of acute illness 07/13/2012  . Neutropenic fever 07/12/2012  . Microcytic anemia 07/12/2012  . Thrombocytopenia 07/12/2012  . Cancer of breast 05/27/2012  . Dizziness 07/28/2011  . Congestion of nasal sinus 07/28/2011  . Allergic rhinitis, cause unspecified 07/28/2011  . Dyspnea on exertion 09/27/2010  . Liver disease, chronic, with cirrhosis   . Morbid obesity 08/03/2009  . IRON DEFICIENCY 06/08/2009  . DIABETES MELLITUS, TYPE II 10/16/2008  . 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 FATTY LIVER DISEASE 09/28/2008  . SPLENOMEGALY 09/28/2008  . Unspecified vitamin D deficiency 08/16/2008  . FATIGUE 08/16/2008  . 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 ARTHRITIS 03/30/2007  .  HYPOTHYROIDISM 03/24/2007  . OBSTRUCTIVE SLEEP APNEA 03/24/2007  . HYPERGLYCEMIA 03/24/2007  . HYPERLIPIDEMIA 10/06/2006  . DEPRESSION 10/06/2006  . HYPERTENSION 10/06/2006  . GERD 10/06/2006  . LIVER FUNCTION TESTS, ABNORMAL 10/06/2006  . DVT, HX OF 10/06/2006    Expected Discharge Date: Expected Discharge Date: 09/28/12  Team Members Present: Physician leading conference: Dr. Faith Rogue Social Worker Present: Amada Jupiter, LCSW Nurse Present: Daryll Brod, RN PT Present: [name], PT;Other (comment) Sherrine Maples, PT) OT Present: Mackie Pai, OT;Patricia Mat Carne, OT Other (Discipline and Name): Tora Duck, PPS Coordinator     Current Status/Progress Goal Weekly Team Focus  Medical   chf, fluid overload, anxiety better, appetite still poor  balance i's and o's after diuresis, maximize nutrition  see above, prior   Bowel/Bladder   Continent of bowel. LBM 09/20/12. Foley to straight drain with clear yellow urine  Pt to remain continent of bowel.   Discuss removal of foley   Swallow/Nutrition/ Hydration             ADL's   mod-max assist with ADLs, min-mod for functional mobility  overall supervision  ADL retraining, sit<>stands, dynamic standing, transfers, functional strengthening & endurance, overall activity tolerance/endurance   Mobility   min to mod/max A overall; min A gait; poor activity tolerance  supervision for gait and transfers; min A for car, stairs, and bed mobility; Likely d/c stair goal and recommended ramp to pt/husband  activity tolerance, dynamic standing balance, sit to stands/transfers, gait training, functional strengthening   Communication             Safety/Cognition/ Behavioral Observations  Pain   No c/o pain  <3  Offer prn pain medicaiton 1hr prior to initial therapy session    Skin   Porta cath to L chest wall. Peg tube to LLQ, Stage 2 to sacrum with Allevyn dressing intact  No additional skin breakdown  Encourage turn q  2hrs      *See Care Plan and progress notes for long and short-term goals.  Barriers to Discharge: see prior    Possible Resolutions to Barriers:  maximize nutrition, see prior    Discharge Planning/Teaching Needs:  Home with husband and family who can provide 24/7 assistance      Team Discussion:  Continue with some swelling due to malnutrition.  Medications to increase appetite.   Depression continues - neuropsych following.  Currently mod assist.  Doing better with O2.  Likely will need to decrease goals overall.    Revisions to Treatment Plan:  None   Continued Need for Acute Rehabilitation Level of Care: The patient requires daily medical management by a physician with specialized training in physical medicine and rehabilitation for the following conditions: Daily direction of a multidisciplinary physical rehabilitation program to ensure safe treatment while eliciting the highest outcome that is of practical value to the patient.: Yes Daily medical management of patient stability for increased activity during participation in an intensive rehabilitation regime.: Yes Daily analysis of laboratory values and/or radiology reports with any subsequent need for medication adjustment of medical intervention for : Other;Neurological problems;Pulmonary problems;Cardiac problems  Eeshan Verbrugge 09/22/2012, 1:20 PM

## 2012-09-22 NOTE — Progress Notes (Signed)
Occupational Therapy Session Note and Weekly Progress Note  Patient Details  Name: SHARNESE HEATH MRN: [id number] Date of Birth: [birth year]  Today's Date: 09/22/2012  SESSION NOTE Time: 4098-1191 Time Calculation (min): 55 min Patient found seated on BSC. Patient engaged in toileting (peri care), then stood with rolling walker and ambulated a few steps -> w/c. From there patient engaged in ADL retraining in sit<>stand position from w/c at sink level. Therapist introduced reacher for LB dressing and patient able to donn underwear and pants with minimal assistance. Patient able to stand with minimal assistance. Patient with decreased anxiety during this session.  Patient's 02 sats remained <90% on room air during rest and during activity. Husband present during session, asking therapist multiple questions regarding patient's care and patient's progress/prognosis. At end of session, left patient seated in w/c on room air with breakfast tray within reach; patient willing to try to eat breakfast.   ------------------------------------------------------------------------------------------------------  WEEKLY PROGRESS NOTE Patient has met 5 of 5 short term goals.  Patient is making steady progress on CIR. Over the past couple days, patient has shown great improvements with overall activity tolerance/endurance during therapy sessions, great improvements with ADLs, great improvements with functional mobility, and an overall decrease in anxiety. Patient's oxygen sats remain <90% on room air during rest and during activities. Patient's husband has been present during most therapy session, encouraging patient prn.   Patient continues to demonstrate the following deficits: decreased overall activity tolerance/endurance, increased anxiety,decreased independence with functional mobility, decreased independence with ADLs, decreased independence with functional transfers, decreased overall functional  strength/endurance. Therefore, patient will continue to benefit from skilled OT intervention to enhance overall performance with BADL, iADL and Reduce care partner burden.  Patient progressing toward long term goals..  Continue plan of care for now.   OT Short Term Goals Week 1:  OT Short Term Goal 1 (Week 1): Patient will perform toilet transfer with moderate assistance using DME & AD prn OT Short Term Goal 1 - Progress (Week 1): Met OT Short Term Goal 2 (Week 1): Patient will bathe 10/10 body parts with moderate assistance in sit<>stand position using AE prn OT Short Term Goal 2 - Progress (Week 1): Met OT Short Term Goal 3 (Week 1): Patient will perform LB dsg (pants only) with minimal assistance in sit<>stand position OT Short Term Goal 3 - Progress (Week 1): Met OT Short Term Goal 4 (Week 1): Patient will donn shirt with minimal assistance, seated un-supported OT Short Term Goal 4 - Progress (Week 1): Met OT Short Term Goal 5 (Week 1): AE for LB ADLs will be introduced to patient OT Short Term Goal 5 - Progress (Week 1): Met  Week 2:  OT Short Term Goal 1 (Week 2): STGs=LTGs  Skilled Therapeutic Interventions/Progress Updates:  Balance/vestibular training;Community reintegration;Discharge planning;Disease mangement/prevention;DME/adaptive equipment instruction;Functional mobility training;Pain management;Patient/family education;Psychosocial support;Self Care/advanced ADL retraining;Skin care/wound managment;Splinting/orthotics;Therapeutic Activities;Therapeutic Exercise;UE/LE Strength taining/ROM;UE/LE Coordination activities;Wheelchair propulsion/positioning   Precautions:  Precautions Precautions: Fall Precaution Comments: Monitor HR Restrictions Weight Bearing Restrictions: No  Vital Signs: Therapy Vitals Temp: 98.2 F (36.8 C) Temp src: Oral Pulse Rate: 66 Resp: 18 BP: 110/63 mmHg Patient Position, if appropriate: Lying Oxygen Therapy SpO2: 94 % O2 Device: Nasal  cannula O2 Flow Rate (L/min): 1 L/min  See FIM for current functional status  Kemi Gell 09/22/2012, 8:46 AM

## 2012-09-22 NOTE — Progress Notes (Addendum)
Physical Therapy Session Note  Patient Details  Name: [name] MRN: [id number] Date of Birth: Apr 25, 1956  Today's Date: 09/22/2012 Time:Session #1:  4098-1191, Session #2: 1420-1450  Time Calculation (min): Session #1: 47 min, Session #2: 30 min  Short Term Goals: Week 2:  PT Short Term Goal 1 (Week 2): = LTGs  Skilled Therapeutic Interventions/Progress Updates:    Session #1: This session focused on WC mobility for UE strength and endurance training.  Up to mod assist for obstacle negotiation.  Transfers into and out of WC to NuStep and to recliner chair.  Increased time due to fatigue and weakness, up to mod assist from lower surfaces and as good as min assist from higher surfaces with armrests.  Stand pivot with RW min assist once on her feet.  NuStep level 2 x 10 mins with 2 seated rest breaks  <1 min in duration.  O2 sats and HR stable on RA.  Borg RPE 13 during NuStep.  Exercises seated in WC: ankle pumps, LAQs, seated marches all x 12 each bil.  Bil hamstring and calf stretch in sitting x 2 mins each bil.   Session #2: This session focused on gait and transfers.  Gait with RW 25'x1, 45'x1, 24'x1 with WC to follow to encourage increased gait distance.  Min assist for gait to help support trunk over visibly fatigued legs. Transfers WC <-> toilet mod assist using grab bar.  WC to bed mod assist with RW.  Sit to supine mod assist to position both legs back into the bed.    Therapy Documentation Precautions:  Precautions Precautions: Fall Precaution Comments: monitor HR and O2 sats on RA Restrictions Weight Bearing Restrictions: No   Vital Signs: Therapy Vitals Pulse Rate: 76 BP: 103/65 mmHg Oxygen Therapy SpO2: 94 % O2 Device: None (Room air) Pain: Pain Assessment Pain Assessment: 0-10 Pain Score:   6 Pain Type: Acute pain Pain Location: Back Pain Radiating Towards: stomach Pain Intervention(s): Repositioned;Ambulation/increased activity   Locomotion  : Ambulation Ambulation/Gait Assistance: 4: Min guard Wheelchair Mobility Distance: 80   See FIM for current functional status  Therapy/Group: Individual Therapy  Lurena Joiner B. Joellyn Grandt, PT, DPT [phone]   09/22/2012, 12:21 PM

## 2012-09-22 NOTE — Progress Notes (Signed)
Social Work Patient ID: [name], female   DOB: [birth year], 57 y.o.   MRN: [id number]  Met yesterday with pt and husband to review team conference.  Agree with continued plan to aim toward 5/20 d/c.  Aware likely to downgrade goals.  Both continue to be concerned about her LE swelling - husband able to speak with MD yesterday afternoon about this.  Will continue to follow.    [name], LCSW

## 2012-09-22 NOTE — Progress Notes (Addendum)
Physical Therapy Session Note  Patient Details  Name: [name] MRN: [id number] Date of Birth: 02-12-1956  Today's Date: 09/22/2012 Time: 0930-1030 Time Calculation (min): 60 min  Short Term Goals: Week 2:  PT Short Term Goal 1 (Week 2): = LTGs  Skilled Therapeutic Interventions/Progress Updates:   Gait with RW x 25' with close S/steady A for endurance and strengthening and short distance gait again to walk to/from car x 15'. Simulated car transfer with overall steady A for transfer into car and able to manage LE's using leg lifter but required mod A for sit to stand out of car due to low surface. Standing with RW alternating toe taps onto 2" step and then progressed to 4" step x 5 reps each side x 3 reps all together with seated rest breaks. Dynamic standing balance activity with recreational therapy to work on standing balance, standing tolerance, and sit to stands to throw horseshoes and reach outside BOS with overall steady A. Pt fatigued at end of session but chose to stay up in w/c for next therapy session.   Therapy Documentation Precautions:  Precautions Precautions: Fall Precaution Comments: Monitor HR Restrictions Weight Bearing Restrictions: No  Vital Signs: Pulse Rate: 76 Oxygen Therapy SpO2: 94 % O2 Device: None (Room air)  Pain: c/o low back pain - premedicated per pt report.   See FIM for current functional status  Therapy/Group: Individual Therapy  [name] [facility] 09/22/2012, 12:06 PM

## 2012-09-23 ENCOUNTER — Inpatient Hospital Stay (HOSPITAL_COMMUNITY): Payer: 59 | Admitting: Occupational Therapy

## 2012-09-23 ENCOUNTER — Inpatient Hospital Stay (HOSPITAL_COMMUNITY): Payer: 59

## 2012-09-23 ENCOUNTER — Inpatient Hospital Stay (HOSPITAL_COMMUNITY): Payer: 59 | Admitting: *Deleted

## 2012-09-23 ENCOUNTER — Encounter (HOSPITAL_COMMUNITY): Payer: 59

## 2012-09-23 LAB — BASIC METABOLIC PANEL
BUN: 11 mg/dL (ref 6–23)
CO2: 24 mEq/L (ref 19–32)
Calcium: 7.8 mg/dL — ABNORMAL LOW (ref 8.4–10.5)
Chloride: 109 mEq/L (ref 96–112)
Creatinine, Ser: 0.56 mg/dL (ref 0.50–1.10)
GFR calc Af Amer: >90 mL/min (ref >90)
GFR calc non Af Amer: >90 mL/min (ref >90)
Glucose, Bld: 68 mg/dL — ABNORMAL LOW (ref 70–99)
Potassium: 4.4 mEq/L (ref 3.5–5.1)
Sodium: 139 mEq/L (ref 135–145)

## 2012-09-23 LAB — GLUCOSE, CAPILLARY
Glucose-Capillary: 67 mg/dL — ABNORMAL LOW (ref 70–99)
Glucose-Capillary: 62 mg/dL — ABNORMAL LOW (ref 70–99)
Glucose-Capillary: 75 mg/dL (ref 70–99)
Glucose-Capillary: 119 mg/dL — ABNORMAL HIGH (ref 70–99)

## 2012-09-23 MED ORDER — LOPERAMIDE HCL 2 MG PO CAPS
2.0000 mg | ORAL_CAPSULE | Freq: Three times a day (TID) | ORAL | Status: DC
  Administered 2012-09-23 – 2012-09-28 (×16): 2 mg via ORAL
  Filled 2012-09-23 (×20): qty 1

## 2012-09-23 MED ORDER — WARFARIN SODIUM 2 MG PO TABS
2.0000 mg | ORAL_TABLET | Freq: Once | ORAL | Status: AC
  Administered 2012-09-23: 2 mg via ORAL
  Filled 2012-09-23: qty 1

## 2012-09-23 NOTE — Progress Notes (Signed)
Occupational Therapy Session Notes  Patient Details  Name: FAITHLYN RECKTENWALD MRN: [id number] Date of Birth: 30-Apr-1956  Today's Date: 09/23/2012  Short Term Goals: Week 1:  OT Short Term Goal 1 (Week 1): Patient will perform toilet transfer with moderate assistance using DME & AD prn OT Short Term Goal 1 - Progress (Week 1): Met OT Short Term Goal 2 (Week 1): Patient will bathe 10/10 body parts with moderate assistance in sit<>stand position using AE prn OT Short Term Goal 2 - Progress (Week 1): Met OT Short Term Goal 3 (Week 1): Patient will perform LB dsg (pants only) with minimal assistance in sit<>stand position OT Short Term Goal 3 - Progress (Week 1): Met OT Short Term Goal 4 (Week 1): Patient will donn shirt with minimal assistance, seated un-supported OT Short Term Goal 4 - Progress (Week 1): Met OT Short Term Goal 5 (Week 1): AE for LB ADLs will be introduced to patient OT Short Term Goal 5 - Progress (Week 1): Met  Week 2:  OT Short Term Goal 1 (Week 2): STGs=LTGs  Skilled Therapeutic Interventions/Progress Updates:   Session #1 [phone] - 45 Minutes Individual Therapy No complaints of pain Patient missed 15 minutes of skilled occupational therapy secondary to nursing care. RN gave patient tube feeds secondary to patient with low glucose levels.  Patient found supine in bed. Therapist donned bilateral TED hose, then therapist educated patient on AE usage and patient sat edge of bed to donn bilateral socks using sock aid and reacher. From there patient ambulated with rolling walker -> w/c with steady assist from therapist. Therapist then propelled patient -> therapy gym for focus on UE exercise using the SCIFIT machine. Patient's 02 sats remained <90% throughout session while patient on room air. At end of session, left patient seated in w/c with BLEs elevated using leg rests. Call bell left within reach and husband present in room.  Session #2 [id number] - 45 Minutes Individual  Therapy No complaints of pain Patient found seated in w/c. Patient propelled self from room -> elevators. Then patient ambulated with rolling walker from elevators -> therapy gym. Therapist then educated patient on safety with ambulation using rolling walker and safety with stand -> sits using rolling walker. Patient tends to get anxious just prior to sitting, therapist encouraged patient to take her time, keep walker in front of her, and feel back with one hand onto sitting surface. Focused skilled intervention on sit<>stands, dynamic standing balance/tolerance/endurance, weight shifting, transitional movements, and overall activity tolerance/endurance. Patient on room air entire session and 02 sats remained <90% during session. At end of session, therapist propelled patient back to room and left her seated in recliner with call bell & phone within reach.   Precautions:  Precautions Precautions: Fall Precaution Comments: monitor HR and O2 sats on RA Restrictions Weight Bearing Restrictions: No  See FIM for current functional status  Alizey Noren 09/23/2012, 7:32 AM

## 2012-09-23 NOTE — Progress Notes (Signed)
Recreational Therapy Session Note  Patient Details  Name: [name] MRN: [id number] Date of Birth: 1956-03-17 Today's Date: 09/23/2012 Time:  3-323 Pain: no c/o Skilled Therapeutic Interventions/Progress Updates: Pt seen for relaxation training session.  Assisted pt to bed.  Attempted sit-->stand with RW, pt required mod assist for first trial.  Second attempt min assist using RW, stand pivot transfer to bed with min assist.  Pt lying in bed listening to relaxation cd of choice with focus on deep breathing.  Therapy/Group: Individual Therapy   [name] 09/23/2012, 3:22 PM

## 2012-09-23 NOTE — Progress Notes (Signed)
Subjective/Complaints: Slept very well. Tired after therapies yesterday A 12 point review of systems has been performed and if not noted above is otherwise negative.   Objective: Vital Signs: Blood pressure 121/64, pulse 74, temperature 97.6 F (36.4 C), temperature source Oral, resp. rate 18, height 5' 4.17" (1.63 m), weight 84.7 kg (186 lb 11.7 oz), SpO2 97.00%. Dg Chest 2 View  09/22/2012   *RADIOLOGY REPORT*  Clinical Data: CHF follow-up  CHEST - 2 VIEW  Comparison: 09/19/2012  Findings: Left chest wall porta-catheter is noted with tip in the SVC.  Mild cardiac enlargement noted.  Bilateral, multifocal airspace opacities persist.  This is not significantly improved from previous exam.  IMPRESSION:  1.  No significant change in aeration to the lungs compared with previous exam.   Original Report Authenticated By: Signa Kell, M.D.   No results found for this basename: WBC, HGB, HCT, PLT,  in the last 72 hours  Recent Labs  09/22/12 0545 09/23/12 0600  NA 141 139  K 4.3 4.4  CL 107 109  GLUCOSE 66* 68*  BUN 11 11  CREATININE 0.55 0.56  CALCIUM 7.5* 7.8*   CBG (last 3)   Recent Labs  09/23/12 0721 09/23/12 0736 09/23/12 0753  GLUCAP 67* 62* 75    Wt Readings from Last 3 Encounters:  09/23/12 84.7 kg (186 lb 11.7 oz)  09/08/12 97.977 kg (216 lb)  09/06/12 97.6 kg (215 lb 2.7 oz)    Physical Exam:  Constitutional: She is oriented to person, place, and time. She appears well-developed. Appears fatigued. obese  HENT: oral mucosa dry. Crusted/blood at corners of mouth. Dry blood on cover. Head: Normocephalic and atraumatic.  Eyes: Pupils are equal, round, and reactive to light.  Neck: Normal range of motion.  Cardiovascular: Normal rate and regular rhythm. No murmurs  Pulmonary/Chest: breathing comfortably. Still with Rales bilaterally Abdominal: Soft. Bowel sounds are normal. She exhibits no distension. There is no tenderness.  PEG in place without drainage.   Musculoskeletal: She exhibits persistent edema (BLE with 1+).  Foam dressing bilateral feet. --only mild scabs on legs.  Neurological: She is alert and oriented to person, place, and time.    Cognitively with basic insight and awareness. mild STM deficits. Motor: 4/5 in bilateral deltoid, biceps, triceps3 minus/5 in bilateral hip flexor knee extensor ankle dorsiflexor and plantar flexor  Sensory to light touch is intact in the upper and lower limbs  Speech is clear. No language deficits Psych: pleasant, less anxious    Assessment/Plan: 1. Functional deficits secondary to deconditioning/FTT after breast cancer and associate complications  which require 3+ hours per day of interdisciplinary therapy in a comprehensive inpatient rehab setting. Physiatrist is providing close team supervision and 24 hour management of active medical problems listed below. Physiatrist and rehab team continue to assess barriers to discharge/monitor patient progress toward functional and medical goals.    FIM: FIM - Bathing Bathing Steps Patient Completed: Chest;Right Arm;Left Arm;Abdomen;Right upper leg;Left upper leg;Front perineal area;Buttocks Bathing: 4: Min-Patient completes 8-9 97f 10 parts or 75+ percent  FIM - Upper Body Dressing/Undressing Upper body dressing/undressing steps patient completed: Thread/unthread right sleeve of pullover shirt/dresss;Thread/unthread left sleeve of pullover shirt/dress;Put head through opening of pull over shirt/dress;Pull shirt over trunk Upper body dressing/undressing: 5: Supervision: Safety issues/verbal cues FIM - Lower Body Dressing/Undressing Lower body dressing/undressing steps patient completed: Thread/unthread right underwear leg;Thread/unthread left underwear leg;Pull underwear up/down;Thread/unthread left pants leg;Pull pants up/down Lower body dressing/undressing: 4: Min-Patient completed 75 plus % of tasks (  min for underwear and pants)  FIM -  Toileting Toileting steps completed by patient: Performs perineal hygiene Toileting Assistive Devices: Grab bar or rail for support Toileting: 0: Activity did not occur  FIM - Diplomatic Services operational officer Devices: Grab bars Toilet Transfers: 4-From toilet/BSC: Min A (steadying Pt. > 75%)  FIM - Bed/Chair Transfer Bed/Chair Transfer Assistive Devices: Bed rails Bed/Chair Transfer: 3: Sit > Supine: Mod A (lifting assist/Pt. 50-74%/lift 2 legs)  FIM - Locomotion: Wheelchair Distance: 80 Locomotion: Wheelchair: 2: Travels 50 - 149 ft with moderate assistance (Pt: 50 - 74%) FIM - Locomotion: Ambulation Locomotion: Ambulation Assistive Devices: Designer, industrial/product Ambulation/Gait Assistance: 4: Min guard Locomotion: Ambulation: 1: Travels less than 50 ft with minimal assistance (Pt.>75%)  Comprehension Comprehension Mode: Auditory Comprehension: 5-Follows basic conversation/direction: With extra time/assistive device  Expression Expression Mode: Verbal Expression: 5-Expresses basic needs/ideas: With extra time/assistive device  Social Interaction Social Interaction: 5-Interacts appropriately 90% of the time - Needs monitoring or encouragement for participation or interaction.  Problem Solving Problem Solving: 5-Solves complex 90% of the time/cues < 10% of the time  Memory Memory: 5-Recognizes or recalls 90% of the time/requires cueing < 10% of the time  Medical Problem List and Plan:  1. DVT Prophylaxis/Anticoagulation: On coumadin for atrial fibrillation.  2. Pain Management: Morphine prn  3. Depression: Team to provide ego support. To continue Wellbutrin, Lexapro and Zyprexa. Will have LCSW follow for support. neuropsych eval pending   -klonopin helpful  for anxiety 4. Neuropsych: This patient is capable of making decisions on her own behalf.  5. HTN: Monitor on bid basis. Lasix, lopressor 6. OSA: continue CPAP at nights.  7. Cirrhosis of liver-autoimmune?:  8.  Atrial Fibrillation with RVR/CHF: Will continue to monitor HR on bid basis. Has been off Cardizem. Continue metoprolol. Amiodarone per cards  -lasix decreased  -foley out.  -follow up cxr stable  -appreciate cardiology assist  -proteint malnutrition---TEDS for edema control, elevation 9.Anemia: remains around 8  -continue to follow serially   10. DM type 2-diet controlled: Regular diet for now to help with food choices and appetite. Continue TF 6 X day and monitor intake.  11. Breast cancer: On Femara. For XRT once stronger. Patient to follow up with Dr. Michell Heinrich aftert discharge 12. Diarrhea:  -recurrent  -rare BM  -re-schedule immodium 13. FEN-   -TF as above  Increased marinol, still no appetite 14. Klebsiella UTI- keflex for 5 days  LOS (Days) 10 A FACE TO FACE EVALUATION WAS PERFORMED  SWARTZ,ZACHARY T 09/23/2012 8:28 AM

## 2012-09-23 NOTE — Significant Event (Signed)
Hypoglycemic Event  CBG: 67 Treatment: 15 GM carbohydrate snack  Symptoms: None  Follow-up CBG: [id number] Result:62 Possible Reasons for Event: Unknown  Comments/MD notified:no new orders given    [name]  Remember to initiate Hypoglycemia Order Set & complete

## 2012-09-23 NOTE — Progress Notes (Signed)
Physical Therapy Session Note  Patient Details  Name: [name] MRN: [id number] Date of Birth: 09/16/1955  Today's Date: 09/23/2012   Session #1: Time:  900- 957 (57 minutes)  Reports pain in low back and along PEG site - premedicated. W/c propulsion down hallway x 65' with S for endurance and strengthening. Dynamic gait through obstacle course to navigate turns and step over objects x 20' repeated 4 times with seated rest breaks using RW with steady A to close S. Introduced [place] HEP for functional strengthening and standing tolerance including standing hip abduction, hamstring curls, seated LAQ, heel raises in standing, toe raises in sitting, mini squats, and sit to stands (x5 reps) all 10 reps bilaterally except sit to stands. Rest breaks needed between each exercise. O2 sats remained > 93% on room air with all activity. Gait with RW x 40' with close S/steady A intermittently for functional mobility training and overall endurance. Transferred with S to recliner in room with LE's elevated for pt to rest before next session.   Session #2: Time: 1130-1200 (30 min) No new pain complaints. Focused session on functional transfers and simulated bed set up at home for bed mobility retraining. Pt's bed is very tall and pt having difficulty being able to get up into the bed safely. Practiced and discussed option of having a hospital bed at home; pt and husband in agreement with this recommendation in regards to energy conservation, safety with bed mobility and transfers (especially with varying levels of activity tolerance and assist needed). Will discuss recommendation with SW as well. Pt with steady A to close S with transfers using RW this session.  Therapy Documentation Precautions:  Precautions Precautions: Fall Precaution Comments: monitor HR and O2 sats on RA Restrictions Weight Bearing Restrictions: No  See FIM for current functional status  Therapy/Group: Individual Therapy  Philip Aspen, PT, DPT  09/23/2012, 12:08 PM

## 2012-09-23 NOTE — Progress Notes (Signed)
ANTICOAGULATION CONSULT NOTE - Follow Up Consult  Pharmacy Consult for coumadin Indication: atrial fibrillation  Allergies  Allergen Reactions  . Olmesartan Medoxomil Cough    REACTION: ? if cough  . Tetracycline Hcl     Unknown reaction, too long for patient to remember   . Venlafaxine     REACTION: severe dry moouth  . Adhesive (Tape) Rash    Patient Measurements: Height: 5' 4.17" (163 cm) Weight: 186 lb 11.7 oz (84.7 kg) IBW/kg (Calculated) : 55.1 Heparin Dosing Weight:   Vital Signs: Temp: 97.6 F (36.4 C) (05/15 0554) Temp src: Oral (05/15 0554) BP: 121/64 mmHg (05/15 0801) Pulse Rate: 74 (05/15 0801)  Labs:  Recent Labs  09/21/12 0609 09/22/12 0545 09/23/12 0600  LABPROT 23.9* 27.3* 27.6*  INR 2.25* 2.69* 2.73*  CREATININE 0.53 0.55 0.56    Estimated Creatinine Clearance: 81 ml/min (by C-G formula based on Cr of 0.56).   Medications:  Scheduled:  . antiseptic oral rinse  15 mL Mouth Rinse BID  . buPROPion  150 mg Oral q morning - 10a  . cephALEXin  250 mg Oral Q8H  . clonazePAM  0.25 mg Oral BID  . dronabinol  5 mg Oral BID AC  . escitalopram  20 mg Oral QHS  . feeding supplement (OSMOLITE 1.5 CAL)  237 mL Per Tube Custom  . feeding supplement (OSMOLITE 1.5 CAL)  360 mL Per Tube BID WC  . furosemide  40 mg Oral Daily  . insulin aspart  0-9 Units Subcutaneous TID WC  . letrozole  2.5 mg Oral Daily  . levothyroxine  125 mcg Oral QAC breakfast  . metoprolol tartrate  25 mg Oral BID  . multivitamin with minerals  1 tablet Oral Daily  . OLANZapine  2.5 mg Oral QHS  . potassium chloride  20 mEq Oral BID  . spironolactone  25 mg Oral Daily  . Warfarin - Pharmacist Dosing Inpatient   Does not apply q1800   Infusions:    Assessment: 57 yo female with afib is currently on therapeutic coumadin.  INR up to 2.73 from 2.69.   Goal of Therapy:  INR 2-3    Plan:  1) Coumadin 2 mg po x1 2) INR in am  Birdell Frasier, Tsz-Yin 09/23/2012,8:25 AM

## 2012-09-23 NOTE — Significant Event (Signed)
Hypoglycemic Event  CBG: 62 Treatment: 15 GM carbohydrate snack  Symptoms: None  Follow-up CBG: Time:0753 CBG Result75  Possible Reasons for Event: Unknown  Comments/MD notified:no new orders given    [name]  Remember to initiate Hypoglycemia Order Set & complete

## 2012-09-23 NOTE — Progress Notes (Signed)
Filed Vitals:   09/22/12 1504 09/22/12 2007 09/23/12 0554 09/23/12 0628  BP: 101/55 102/62 110/59   Pulse: 65 69 70   Temp: 97.5 F (36.4 C)  97.6 F (36.4 C)   TempSrc: Axillary  Oral   Resp: 17  18   Height:      Weight:    186 lb 11.7 oz (84.7 kg)  SpO2: 96%  97%     Intake/Output Summary (Last 24 hours) at 09/23/12 0744 Last data filed at 09/23/12 0347  Gross per 24 hour  Intake     60 ml  Output   2300 ml  Net  -2240 ml    SUBJECTIVE Continues good diuresis. States breathing is much better.  LABS: Basic Metabolic Panel:  Recent Labs  16/10/96 0545 09/23/12 0600  NA 141 139  K 4.3 4.4  CL 107 109  CO2 26 24  GLUCOSE 66* 68*  BUN 11 11  CREATININE 0.55 0.56  CALCIUM 7.5* 7.8*   Liver Function Tests: No results found for this basename: AST, ALT, ALKPHOS, BILITOT, PROT, ALBUMIN,  in the last 72 hours No results found for this basename: LIPASE, AMYLASE,  in the last 72 hours CBC: No results found for this basename: WBC, NEUTROABS, HGB, HCT, MCV, PLT,  in the last 72 hours Radiology/Studies:  Dg Chest 2 View  09/17/2012   *RADIOLOGY REPORT*  Clinical Data: Congestive heart failure.  Shortness breath. Weakness.  Cirrhosis.  Breast carcinoma.  CHEST - 2 VIEW  Comparison: 09/16/2012  Findings: Bilateral pulmonary air space disease shows no significant interval change.  Cardiomegaly remains stable. Left- sided Port-A-Cath remains in appropriate position.  IMPRESSION: Stable bilateral pulmonary air space disease and cardiomegaly.   Original Report Authenticated By: Myles Rosenthal, M.D.    PHYSICAL EXAM General: Well developed, well nourished, in no acute distress. Head: Normocephalic, atraumatic, sclera non-icteric. Neck: Negative for carotid bruits. JVD mildly elevated. Lungs: Clear bilaterally to auscultation without wheezes, rales, or rhonchi. Breathing is unlabored. Heart: RRR S1 S2 with 2/6 systolic ejection murmur LSB Abdomen: Soft, non-tender, non-distended  with normoactive bowel sounds.  Extremities: 1+ edema.  Distal pedal pulses are 2+ and equal bilaterally.  Her ankles / heels still have edema.  She has open , draining areas on her heels.   Neuro: Alert and oriented X 3. Moves all extremities spontaneously. Psych:  Responds to questions appropriately with a normal affect.  ASSESSMENT AND PLAN: 1. Acute diastolic CHF. Continues to diurese very well. Weight down significantly. Still some edema. Will reduce lasix to once a day. 2. Atrial fibrillation- she has converted to NSR.  We stopped amiodarone due thyroid abnormalities. On Coumadin. 3. Hypokalemia.   She remains stable from a cardiac standpoint.  Will sign off.  I note that she is anticipated to be discharged on May 20.  Continue current meds.  I will see her in the office in 3-4 weeks.   Will sign off.  Call for questions.   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

## 2012-09-24 ENCOUNTER — Inpatient Hospital Stay (HOSPITAL_COMMUNITY): Payer: 59 | Admitting: Occupational Therapy

## 2012-09-24 ENCOUNTER — Inpatient Hospital Stay (HOSPITAL_COMMUNITY): Payer: 59

## 2012-09-24 DIAGNOSIS — R5381 Other malaise: Secondary | ICD-10-CM

## 2012-09-24 DIAGNOSIS — E669 Obesity, unspecified: Secondary | ICD-10-CM

## 2012-09-24 DIAGNOSIS — C50919 Malignant neoplasm of unspecified site of unspecified female breast: Secondary | ICD-10-CM

## 2012-09-24 DIAGNOSIS — F341 Dysthymic disorder: Secondary | ICD-10-CM

## 2012-09-24 LAB — GLUCOSE, CAPILLARY
Glucose-Capillary: 69 mg/dL — ABNORMAL LOW (ref 70–99)
Glucose-Capillary: 66 mg/dL — ABNORMAL LOW (ref 70–99)
Glucose-Capillary: 92 mg/dL (ref 70–99)

## 2012-09-24 LAB — BASIC METABOLIC PANEL
GFR calc Af Amer: >90 mL/min (ref >90)
GFR calc non Af Amer: >90 mL/min (ref >90)
Glucose, Bld: 62 mg/dL — ABNORMAL LOW (ref 70–99)
Potassium: 4.4 mEq/L (ref 3.5–5.1)
Sodium: 137 mEq/L (ref 135–145)

## 2012-09-24 LAB — PROTIME-INR: INR: 2.44 — ABNORMAL HIGH (ref 0.00–1.49)

## 2012-09-24 MED ORDER — LIDOCAINE HCL 2 % EX GEL
CUTANEOUS | Status: DC | PRN
  Filled 2012-09-24: qty 5

## 2012-09-24 MED ORDER — WARFARIN SODIUM 3 MG PO TABS
3.0000 mg | ORAL_TABLET | Freq: Once | ORAL | Status: AC
  Administered 2012-09-24: 3 mg via ORAL
  Filled 2012-09-24: qty 1

## 2012-09-24 NOTE — Plan of Care (Signed)
Problem: RH BLADDER ELIMINATION Goal: RH STG MANAGE BLADDER WITH EQUIPMENT WITH ASSISTANCE STG Manage Bladder With Equipment With Assistance . Mod I  Outcome: Progressing Foley continued.   Problem: RH SKIN INTEGRITY Goal: RH STG SKIN FREE OF INFECTION/BREAKDOWN Min A  Outcome: Not Progressing Allevyn continued to sacral wound and to bilateral heels for protection.

## 2012-09-24 NOTE — Progress Notes (Signed)
ANTICOAGULATION CONSULT NOTE - Follow Up Consult  Pharmacy Consult for coumadin Indication: atrial fibrillation  Allergies  Allergen Reactions  . Olmesartan Medoxomil Cough    REACTION: ? if cough  . Tetracycline Hcl     Unknown reaction, too long for patient to remember   . Venlafaxine     REACTION: severe dry moouth  . Adhesive (Tape) Rash    Patient Measurements: Height: 5' 4.17" (163 cm) Weight: 185 lb 3 oz (84 kg) IBW/kg (Calculated) : 55.1 Heparin Dosing Weight:   Vital Signs: Temp: 98.6 F (37 C) (05/16 0500) Temp src: Oral (05/16 0500) BP: 119/64 mmHg (05/16 0500) Pulse Rate: 76 (05/16 0500)  Labs:  Recent Labs  09/22/12 0545 09/23/12 0600 09/24/12 0500  LABPROT 27.3* 27.6* 25.4*  INR 2.69* 2.73* 2.44*  CREATININE 0.55 0.56 0.52    Estimated Creatinine Clearance: 80.7 ml/min (by C-G formula based on Cr of 0.52).   Medications:  Scheduled:  . antiseptic oral rinse  15 mL Mouth Rinse BID  . buPROPion  150 mg Oral q morning - 10a  . cephALEXin  250 mg Oral Q8H  . clonazePAM  0.25 mg Oral BID  . dronabinol  5 mg Oral BID AC  . escitalopram  20 mg Oral QHS  . feeding supplement (OSMOLITE 1.5 CAL)  237 mL Per Tube Custom  . feeding supplement (OSMOLITE 1.5 CAL)  360 mL Per Tube BID WC  . furosemide  40 mg Oral Daily  . insulin aspart  0-9 Units Subcutaneous TID WC  . letrozole  2.5 mg Oral Daily  . levothyroxine  125 mcg Oral QAC breakfast  . loperamide  2 mg Oral TID  . metoprolol tartrate  25 mg Oral BID  . multivitamin with minerals  1 tablet Oral Daily  . OLANZapine  2.5 mg Oral QHS  . potassium chloride  20 mEq Oral BID  . spironolactone  25 mg Oral Daily  . Warfarin - Pharmacist Dosing Inpatient   Does not apply q1800   Infusions:    Assessment: 57 yo female with afib is currently on therapeutic coumadin, however, INR is down a little to 2.44 Goal of Therapy:  INR 2-3    Plan:  1) Coumadin 3mg  po x1 (may need alternating 2 & 3mg ) 2)  INR in am  Glendell Fouse, Tsz-Yin 09/24/2012,8:32 AM

## 2012-09-24 NOTE — Consult Note (Signed)
  Neuropsychology Psychosocial Evaluation - Confidential [facility] _____________________________________________________________________________  Per request from the treatment team, I met with Mrs. Allene Dillon for a session to provide brief supportive therapy.  During today's visit, Mrs. Arrey appeared to be less depressed than she was at the time of my initial evaluation 1 week ago and she denied any recent suicidal ideation.  She was describing feeling optimistic about recovery and seemed motivated to do what she could to try and get better.  She stated that she felt like overall, she is "taking it all in stride" and trying to "save myself to get back to life."  When she discussed her continued tumultuous relationship with her sister, she became tearful and was receptive to prior suggestion to participate in individual psychotherapy post-discharge.  She has reportedly continued to use her prayer shawl to provide comfort and assist in reducing depressed mood.  Mrs. Obrien's affect appeared depressed even when she was describing feeling better, but she reported that she was significantly fatigued during today's session because of all of her therapies, which is why she felt her facial expression did not match the content of her speech.  Mrs. Hester stated that she feels ready for discharge, but is worried about how she will get around in her home and how much exercise she will be able to do to work toward her rehabilitation, given the size of her home.  She also continued to express worry over finances and fears that she may lose her home if she cannot go back to work.  However, she said that her husband is continuing to work to resolve any issues with finances and has repeatedly told her not to worry about it because he will handle it.  I encouraged her to "let him do the worrying" whenever possible in order to help foster her newly found improved mood and to promote her overall  recovery.  It is notable that despite improvement in mood, her responses to a self-report measure of depressive symptoms continues to be suggestive of the presence of significant depression.     It is encouraging that Mrs. Gasparini's mood seems to have improved.  However, her mood should continue to be monitored closely while on the unit, given her recent suicidal ideation and continued depression.  Additionally, it continues to be a top recommendation to provide her with resources to help her engage in individual psychotherapy post-discharge in order to provide her with an appropriate outlet to work through her depression.    DIAGNOSIS: Breast Cancer Deconditioning Depressive disorder, NOS  Greater than 50% of this visit was spent educating the patient about the possible diagnosis, prognosis, management plan, and in coordination of care.   Leavy Cella, PsyD Clinical Neuropsychologist

## 2012-09-24 NOTE — Progress Notes (Signed)
Subjective/Complaints: Feeling stronger. No diarrhea A 12 point review of systems has been performed and if not noted above is otherwise negative.   Objective: Vital Signs: Blood pressure 119/64, pulse 76, temperature 98.6 F (37 C), temperature source Oral, resp. rate 19, height 5' 4.17" (1.63 m), weight 84 kg (185 lb 3 oz), SpO2 92.00%. No results found. No results found for this basename: WBC, HGB, HCT, PLT,  in the last 72 hours  Recent Labs  09/23/12 0600 09/24/12 0500  NA 139 137  K 4.4 4.4  CL 109 108  GLUCOSE 68* 62*  BUN 11 9  CREATININE 0.56 0.52  CALCIUM 7.8* 7.7*   CBG (last 3)   Recent Labs  09/23/12 2103 09/24/12 0709 09/24/12 0732  GLUCAP 119* 69* 63*    Wt Readings from Last 3 Encounters:  09/24/12 84 kg (185 lb 3 oz)  09/08/12 97.977 kg (216 lb)  09/06/12 97.6 kg (215 lb 2.7 oz)    Physical Exam:  Constitutional: She is oriented to person, place, and time. She appears well-developed. Appears fatigued. obese  HENT: oral mucosa dry. Crusted/blood at corners of mouth. Dry blood on cover. Head: Normocephalic and atraumatic.  Eyes: Pupils are equal, round, and reactive to light.  Neck: Normal range of motion.  Cardiovascular: Normal rate and regular rhythm. No murmurs  Pulmonary/Chest: breathing comfortably. Still with Rales bilaterally Abdominal: Soft. Bowel sounds are normal. She exhibits no distension. There is no tenderness.  PEG in place without drainage.  Musculoskeletal: She exhibits persistent edema (BLE with 1+).  Foam dressing bilateral feet. --only mild scabs on legs.  Neurological: She is alert and oriented to person, place, and time.    Cognitively with basic insight and awareness. mild STM deficits. Motor: 4/5 in bilateral deltoid, biceps, triceps3 minus/5 in bilateral hip flexor knee extensor ankle dorsiflexor and plantar flexor  Sensory to light touch is intact in the upper and lower limbs  Speech is clear. No language  deficits Psych: pleasant, less anxious    Assessment/Plan: 1. Functional deficits secondary to deconditioning/FTT after breast cancer and associate complications  which require 3+ hours per day of interdisciplinary therapy in a comprehensive inpatient rehab setting. Physiatrist is providing close team supervision and 24 hour management of active medical problems listed below. Physiatrist and rehab team continue to assess barriers to discharge/monitor patient progress toward functional and medical goals.   Making nice gains with therapy.   FIM: FIM - Bathing Bathing Steps Patient Completed: Chest;Right Arm;Left Arm;Abdomen;Right upper leg;Left upper leg;Front perineal area;Buttocks Bathing: 4: Min-Patient completes 8-9 20f 10 parts or 75+ percent  FIM - Upper Body Dressing/Undressing Upper body dressing/undressing steps patient completed: Thread/unthread right sleeve of pullover shirt/dresss;Thread/unthread left sleeve of pullover shirt/dress;Put head through opening of pull over shirt/dress;Pull shirt over trunk Upper body dressing/undressing: 5: Supervision: Safety issues/verbal cues FIM - Lower Body Dressing/Undressing Lower body dressing/undressing steps patient completed: Thread/unthread left underwear leg;Pull underwear up/down;Thread/unthread left pants leg;Pull pants up/down Lower body dressing/undressing: 3: Mod-Patient completed 50-74% of tasks  FIM - Toileting Toileting steps completed by patient: Performs perineal hygiene Toileting Assistive Devices: Grab bar or rail for support Toileting: 1: Total-Patient completed zero steps, helper did all 3  FIM - Diplomatic Services operational officer Devices: Grab bars Toilet Transfers: 4-From toilet/BSC: Min A (steadying Pt. > 75%);4-To toilet/BSC: Min A (steadying Pt. > 75%)  FIM - Bed/Chair Transfer Bed/Chair Transfer Assistive Devices: Manufacturing systems engineer Transfer: 2: Supine > Sit: Max A (lifting assist/Pt. 25-49%);4: Bed >  Chair or W/C: Min A (steadying Pt. > 75%)  FIM - Locomotion: Wheelchair Distance: 80 Locomotion: Wheelchair: 2: Travels 50 - 149 ft with supervision, cueing or coaxing FIM - Locomotion: Ambulation Locomotion: Ambulation Assistive Devices: Designer, industrial/product Ambulation/Gait Assistance: 4: Min guard Locomotion: Ambulation: 1: Travels less than 50 ft with minimal assistance (Pt.>75%)  Comprehension Comprehension Mode: Auditory Comprehension: 5-Follows basic conversation/direction: With extra time/assistive device  Expression Expression Mode: Verbal Expression: 5-Expresses basic needs/ideas: With extra time/assistive device  Social Interaction Social Interaction: 5-Interacts appropriately 90% of the time - Needs monitoring or encouragement for participation or interaction.  Problem Solving Problem Solving: 5-Solves complex 90% of the time/cues < 10% of the time  Memory Memory: 5-Recognizes or recalls 90% of the time/requires cueing < 10% of the time  Medical Problem List and Plan:  1. DVT Prophylaxis/Anticoagulation: On coumadin for atrial fibrillation.  2. Pain Management: Morphine prn  3. Depression: Team to provide ego support. To continue Wellbutrin, Lexapro and Zyprexa. Will have LCSW follow for support. neuropsych eval pending   -klonopin helpful  for anxiety 4. Neuropsych: This patient is capable of making decisions on her own behalf.  5. HTN: Monitor on bid basis. Lasix, lopressor 6. OSA: continue CPAP at nights.  7. Cirrhosis of liver-autoimmune?:  8. Atrial Fibrillation with RVR/CHF: Will continue to monitor HR on bid basis. Has been off Cardizem. Continue metoprolol. Amiodarone per cards  -lasix decreased  -foley out.  -follow up cxr stable  -appreciate cardiology assist  -proteint malnutrition---TEDS for edema control, elevation 9.Anemia: remains around 8  -continue to follow serially   10. DM type 2-diet controlled: Regular diet for now to help with food choices  and appetite. Continue TF 6 X day and monitor intake.  11. Breast cancer: On Femara. For XRT once stronger. Patient to follow up with Dr. Michell Heinrich aftert discharge 12. Diarrhea:  -labs ok today  -needs scheduled imodium 13. FEN-   -TF as above  -no appetite despite several modifications, meds, etc 14. Klebsiella UTI- keflex for 5 days  LOS (Days) 11 A FACE TO FACE EVALUATION WAS PERFORMED  SWARTZ,ZACHARY T 09/24/2012 8:01 AM

## 2012-09-24 NOTE — Progress Notes (Signed)
Pt is using her home CPAP machine. Rt will assist as needed.

## 2012-09-24 NOTE — Progress Notes (Signed)
Occupational Therapy Session Note  Patient Details  Name: MERISA JULIO MRN: [id number] Date of Birth: [birth year]  Today's Date: 09/24/2012 Time: 1500-1550 Time Calculation (min): 50 min  Short Term Goals: Week 2:  OT Short Term Goal 1 (Week 2): STGs=LTGs  Skilled Therapeutic Interventions/Progress Updates:  Engaged in patient/caregiver education with patient's husband.  Addressed stand step transfers w/c><bed with RW and w/c><commode using grab bar.  Patient practiced using leg lifter to get into bed yet required assist with each leg, vcs for bridging technique to translate to middle of the bed and supervision to transition from supine to EOB using leg lifter.  Patient agreed to practice toilet transfers using the w/c even though the husband reports that the w/c will not fit in the bathroom.  Patient decline to practice any of the 3 toileting tasks due to she was too tired.  Continued practice is encouraged.  Therapy Documentation Precautions:  Precautions Precautions: Fall Precaution Comments: monitor HR and O2 sats on RA Restrictions Weight Bearing Restrictions: No Pain: Denies pain Therapy/Group: Individual Therapy  Lynzie Cliburn 09/24/2012, 5:10 PM

## 2012-09-24 NOTE — Progress Notes (Signed)
Occupational Therapy Session Notes   Patient Details  Name: [name] MRN: [id number] Date of Birth: [birth year]  Today's Date: 09/24/2012  Short Term Goals: Week 1:  OT Short Term Goal 1 (Week 1): Patient will perform toilet transfer with moderate assistance using DME & AD prn OT Short Term Goal 1 - Progress (Week 1): Met OT Short Term Goal 2 (Week 1): Patient will bathe 10/10 body parts with moderate assistance in sit<>stand position using AE prn OT Short Term Goal 2 - Progress (Week 1): Met OT Short Term Goal 3 (Week 1): Patient will perform LB dsg (pants only) with minimal assistance in sit<>stand position OT Short Term Goal 3 - Progress (Week 1): Met OT Short Term Goal 4 (Week 1): Patient will donn shirt with minimal assistance, seated un-supported OT Short Term Goal 4 - Progress (Week 1): Met OT Short Term Goal 5 (Week 1): AE for LB ADLs will be introduced to patient OT Short Term Goal 5 - Progress (Week 1): Met  Week 2:  OT Short Term Goal 1 (Week 2): STGs=LTGs  Skilled Therapeutic Interventions/Progress Updates:   Session #1 0705-0800 - 55 Minutes Individual Therapy No complaints of pain Patient found supine in bed. Therapist donned bilateral TEDs, then when patient engaging in bed mobility stated she needed to use bathroom. While patient attempting to sit edge of bed, patient with massive incontinent bowel movement. Patient ambulated with rolling walker into bathroom. Therapist provided set-up assist for patient to clean self. Therapist then had to assist secondary to patient unable to fully clean self. Husband left the room during this time, therapist made sure husband felt comfortable assisting patient at home if this were to happen. Patient engaged in multiple sit<>stands and dynamic standing balance/tolerance/endurance during clean up. Patient then sat in w/c at sink for UB/LB bathing and dressing as well as grooming tasks. Therapist donned clean TEDs and socks. At end of  session, left patient seated in w/c for next therapy session.   Session #2 1130-1200 - 30 Minutes Individual Therapy No complaints of pain Patient found seated in recliner upon entering room. Patient ambulated from recliner -> nurses station with occasional steady assist. Therapist then propelled patient -> ADL apartment for focus on functional ambulation using rolling walker, tub/shower transfer using leg lifter prn (supervision on/off bench), sit<>stands, overall safety, and overall activity tolerance/endurance. Then patient engaged in scooting exercises on mat in therapy gym to help increase independence with bed mobility, tub/shower transfers, and overall scooting on various surfaces. At end of session, left patient seated in w/c with call bell & phone within reach. Discussed with husband importance of family education Monday prior to Tuesday discharge. Also discussed with husband and patient recommendation of purchasing leg lifter to increase independence with various tasks.   Precautions:  Precautions Precautions: Fall Precaution Comments: monitor HR and O2 sats on RA Restrictions Weight Bearing Restrictions: No  See FIM for current functional status  Alexas Basulto 09/24/2012, 7:00 AM

## 2012-09-24 NOTE — Progress Notes (Signed)
Physical Therapy Session Note  Patient Details  Name: [name] MRN: [id number] Date of Birth: 10-24-1955  Today's Date: 09/24/2012 Time: 0815-0900 Time Calculation (min): 45 min (Missed 15 min of skilled PT due to RN care)  Short Term Goals: Week 2:  PT Short Term Goal 1 (Week 2): = LTGs  Skilled Therapeutic Interventions/Progress Updates:    D/c planning discussed with pt and husband in regards to DME and set up of home/bed room recommendations. Husband also confirms that ramp is to be built this weekend. Pt with low blood sugar reading this AM and RN providing tube feeds before pt leaving for therapy (15 min unbillable time). W/c propulsion x 30' with S in straight line and min A needed for turning. Gait with RW for endurance and functional mobility training x 10', x 20', and x 10' with steady A - distance mainly limited due to fatigue and pt "feeling weak". Seated therex during rest breaks including LAQ, hip marches, and heel/toe raises in seated position x 10 reps x 2 sets BLE. Standing tolerance and balance activity at tabletop to play Connect 4 with close S; limited to about 1.5 -2 min tolerating standing at a time before requiring seated rest break. Nustep for endurance and strengthening x 5 min on level 3. Returned to room and transferred to recliner with S using RW with LE's elevated.   Therapy Documentation Precautions:  Precautions Precautions: Fall Precaution Comments: monitor HR and O2 sats on RA Restrictions Weight Bearing Restrictions: No  Pain:  No complaints. Just worn out from big bowel movement/toileting this morning.  See FIM for current functional status  Therapy/Group: Individual Therapy  [name] [facility] 09/24/2012, 9:00 AM

## 2012-09-25 ENCOUNTER — Inpatient Hospital Stay (HOSPITAL_COMMUNITY): Payer: 59 | Admitting: *Deleted

## 2012-09-25 LAB — GLUCOSE, CAPILLARY
Glucose-Capillary: 53 mg/dL — ABNORMAL LOW (ref 70–99)
Glucose-Capillary: 85 mg/dL (ref 70–99)

## 2012-09-25 LAB — BASIC METABOLIC PANEL
BUN: 9 mg/dL (ref 6–23)
CO2: 23 mEq/L (ref 19–32)
Calcium: 7.7 mg/dL — ABNORMAL LOW (ref 8.4–10.5)
Chloride: 109 mEq/L (ref 96–112)
Creatinine, Ser: 0.57 mg/dL (ref 0.50–1.10)
Glucose, Bld: 61 mg/dL — ABNORMAL LOW (ref 70–99)

## 2012-09-25 MED ORDER — WARFARIN SODIUM 3 MG PO TABS
3.0000 mg | ORAL_TABLET | Freq: Once | ORAL | Status: AC
  Administered 2012-09-25: 3 mg via ORAL
  Filled 2012-09-25: qty 1

## 2012-09-25 NOTE — Progress Notes (Signed)
Subjective/Complaints: Feeling stronger.going to caf to eat with husband today!! A 12 point review of systems has been performed and if not noted above is otherwise negative.   Objective: Vital Signs: Blood pressure 95/60, pulse 72, temperature 98.4 F (36.9 C), temperature source Oral, resp. rate 20, height 5' 4.17" (1.63 m), weight 70.9 kg (156 lb 4.9 oz), SpO2 96.00%. No results found. No results found for this basename: WBC, HGB, HCT, PLT,  in the last 72 hours  Recent Labs  09/24/12 0500 09/25/12 0500  NA 137 138  K 4.4 4.2  CL 108 109  GLUCOSE 62* 61*  BUN 9 9  CREATININE 0.52 0.57  CALCIUM 7.7* 7.7*   CBG (last 3)   Recent Labs  09/24/12 1644 09/24/12 2043 09/25/12 0738  GLUCAP 88 71 53*    Wt Readings from Last 3 Encounters:  09/25/12 70.9 kg (156 lb 4.9 oz)  09/08/12 97.977 kg (216 lb)  09/06/12 97.6 kg (215 lb 2.7 oz)    Physical Exam:  Constitutional: She is oriented to person, place, and time. She appears well-developed. Appears fatigued. obese  HENT: oral mucosa dry. Crusted/blood at corners of mouth. Dry blood on cover. Head: Normocephalic and atraumatic.  Eyes: Pupils are equal, round, and reactive to light.  Neck: Normal range of motion.  Cardiovascular: Normal rate and regular rhythm. No murmurs  Pulmonary/Chest: breathing comfortably. Still with Rales bilaterally Abdominal: Soft. Bowel sounds are normal. She exhibits no distension. There is no tenderness.  PEG in place without drainage.  Musculoskeletal: She exhibits persistent edema (BLE with trace to 1+).  Foam dressing bilateral feet. --only mild scabs on legs.  Neurological: She is alert and oriented to person, place, and time.    Cognitively with basic insight and awareness. mild STM deficits. Motor: 4/5 in bilateral deltoid, biceps, triceps3 minus/5 in bilateral hip flexor knee extensor ankle dorsiflexor and plantar flexor  Sensory to light touch is intact in the upper and lower limbs   Speech is clear. No language deficits Psych: pleasant, less anxious    Assessment/Plan: 1. Functional deficits secondary to deconditioning/FTT after breast cancer and associate complications  which require 3+ hours per day of interdisciplinary therapy in a comprehensive inpatient rehab setting. Physiatrist is providing close team supervision and 24 hour management of active medical problems listed below. Physiatrist and rehab team continue to assess barriers to discharge/monitor patient progress toward functional and medical goals.   Improving mentally and physically   FIM: FIM - Bathing Bathing Steps Patient Completed: Chest;Right Arm;Left Arm;Abdomen;Right upper leg;Left upper leg;Front perineal area;Buttocks Bathing: 4: Min-Patient completes 8-9 90f 10 parts or 75+ percent  FIM - Upper Body Dressing/Undressing Upper body dressing/undressing steps patient completed: Thread/unthread right sleeve of pullover shirt/dresss;Thread/unthread left sleeve of pullover shirt/dress;Put head through opening of pull over shirt/dress;Pull shirt over trunk Upper body dressing/undressing: 5: Supervision: Safety issues/verbal cues FIM - Lower Body Dressing/Undressing Lower body dressing/undressing steps patient completed: Thread/unthread left underwear leg;Pull underwear up/down;Thread/unthread left pants leg;Pull pants up/down Lower body dressing/undressing: 3: Mod-Patient completed 50-74% of tasks  FIM - Toileting Toileting steps completed by patient: Performs perineal hygiene Toileting Assistive Devices: Grab bar or rail for support Toileting: 1: Total-Patient completed zero steps, helper did all 3  FIM - Diplomatic Services operational officer Devices: Grab bars Toilet Transfers: 4-From toilet/BSC: Min A (steadying Pt. > 75%);4-To toilet/BSC: Min A (steadying Pt. > 75%)  FIM - Bed/Chair Transfer Bed/Chair Transfer Assistive Devices: Manufacturing systems engineer Transfer: 2: Supine > Sit: Max A  (  lifting assist/Pt. 25-49%);4: Bed > Chair or W/C: Min A (steadying Pt. > 75%)  FIM - Locomotion: Wheelchair Distance: 80 Locomotion: Wheelchair: 1: Travels less than 50 ft with minimal assistance (Pt.>75%) FIM - Locomotion: Ambulation Locomotion: Ambulation Assistive Devices: Designer, industrial/product Ambulation/Gait Assistance: 4: Min guard Locomotion: Ambulation: 1: Travels less than 50 ft with minimal assistance (Pt.>75%)  Comprehension Comprehension Mode: Auditory Comprehension: 5-Follows basic conversation/direction: With extra time/assistive device  Expression Expression Mode: Verbal Expression: 5-Expresses basic needs/ideas: With extra time/assistive device  Social Interaction Social Interaction: 5-Interacts appropriately 90% of the time - Needs monitoring or encouragement for participation or interaction.  Problem Solving Problem Solving: 5-Solves complex 90% of the time/cues < 10% of the time  Memory Memory: 5-Recognizes or recalls 90% of the time/requires cueing < 10% of the time  Medical Problem List and Plan:  1. DVT Prophylaxis/Anticoagulation: On coumadin for atrial fibrillation.  2. Pain Management: Morphine prn  3. Depression: Team to provide ego support. To continue Wellbutrin, Lexapro and Zyprexa. Will have LCSW follow for support. neuropsych eval pending   -klonopin helpful  for anxiety 4. Neuropsych: This patient is capable of making decisions on her own behalf.  5. HTN: Monitor on bid basis. Lasix, lopressor 6. OSA: continue CPAP at nights.  7. Cirrhosis of liver-autoimmune?:  8. Atrial Fibrillation with RVR/CHF: Will continue to monitor HR on bid basis. Has been off Cardizem. Continue metoprolol. Amiodarone per cards  -lasix decreased  -foley out.  -follow up cxr stable  -appreciate cardiology assist  -proteint malnutrition---TEDS for edema control, elevation 9.Anemia: remains around 8  -continue to follow serially   10. DM type 2-diet controlled: Regular  diet for now to help with food choices and appetite. Continue TF 6 X day and monitor intake.  11. Breast cancer: On Femara. For XRT once stronger. Patient to follow up with Dr. Michell Heinrich aftert discharge 12. Diarrhea:  -labs ok   -needs scheduled imodium 13. FEN-   -TF as above  -no appetite despite several modifications, meds, etc  -going down to cafeteria to try to eat today! 14. Klebsiella UTI- rx'ed  LOS (Days) 12 A FACE TO FACE EVALUATION WAS PERFORMED  SWARTZ,ZACHARY T 09/25/2012 8:07 AM

## 2012-09-25 NOTE — Progress Notes (Signed)
ANTICOAGULATION CONSULT NOTE - Follow Up Consult  Pharmacy Consult for coumadin Indication: atrial fibrillation  Allergies  Allergen Reactions  . Olmesartan Medoxomil Cough    REACTION: ? if cough  . Tetracycline Hcl     Unknown reaction, too long for patient to remember   . Venlafaxine     REACTION: severe dry moouth  . Adhesive (Tape) Rash    Patient Measurements: Height: 5' 4.17" (163 cm) Weight: 156 lb 4.9 oz (70.9 kg) IBW/kg (Calculated) : 55.1 Heparin Dosing Weight:   Vital Signs: Temp: 98.4 F (36.9 C) (05/17 0556) Temp src: Oral (05/17 0556) BP: 115/70 mmHg (05/17 0904) Pulse Rate: 71 (05/17 0904)  Labs:  Recent Labs  09/23/12 0600 09/24/12 0500 09/25/12 0500  LABPROT 27.6* 25.4* 23.8*  INR 2.73* 2.44* 2.24*  CREATININE 0.56 0.52 0.57    Estimated Creatinine Clearance: 74.3 ml/min (by C-G formula based on Cr of 0.57).   Medications:  Scheduled:  . antiseptic oral rinse  15 mL Mouth Rinse BID  . buPROPion  150 mg Oral q morning - 10a  . clonazePAM  0.25 mg Oral BID  . dronabinol  5 mg Oral BID AC  . escitalopram  20 mg Oral QHS  . feeding supplement (OSMOLITE 1.5 CAL)  237 mL Per Tube Custom  . feeding supplement (OSMOLITE 1.5 CAL)  360 mL Per Tube BID WC  . furosemide  40 mg Oral Daily  . insulin aspart  0-9 Units Subcutaneous TID WC  . letrozole  2.5 mg Oral Daily  . levothyroxine  125 mcg Oral QAC breakfast  . loperamide  2 mg Oral TID  . metoprolol tartrate  25 mg Oral BID  . multivitamin with minerals  1 tablet Oral Daily  . OLANZapine  2.5 mg Oral QHS  . potassium chloride  20 mEq Oral BID  . spironolactone  25 mg Oral Daily  . Warfarin - Pharmacist Dosing Inpatient   Does not apply q1800   Infusions:    Assessment: 57 yo female with afib is currently on therapeutic coumadin, yet INR slightly decreased. No complications noted.  Goal of Therapy:  INR 2-3   Plan:  1) Coumadin 3mg  po x1 (may need alternating 2 & 3mg ) 2) INR in  am  Maceo Hernan L. Illene Bolus, PharmD, BCPS Clinical Pharmacist Pager: [phone] Pharmacy: [phone] 09/25/2012 1:25 PM

## 2012-09-25 NOTE — Progress Notes (Addendum)
Hypoglycemic Event Changed- Placed in significant event tab

## 2012-09-25 NOTE — Progress Notes (Signed)
Physical Therapy Session Note  Patient Details  Name: [name] MRN: [id number] Date of Birth: 02-09-1956  Today's Date: 09/25/2012 Time:  15:00-16:00 ( )  Skilled Therapeutic Interventions/Progress Updates:  Pt participated in stride right group for gait and therex for LE strengthening.   Nustep x78min, level 5 with bil UE/LE for strengthening and activity tolerance. Pt needed seated rest breaks throughout tx.   Seated therex including each of the following 2x10: ankle pumps, LAQ, marching with cues for technique.   Gait 2x25' with RW and Min A.      Therapy Documentation Precautions:  Precautions Precautions: Fall Precaution Comments: monitor HR and O2 sats on RA Restrictions Weight Bearing Restrictions: No   Pain: None  See FIM for current functional status  Therapy/Group: Group Therapy  Clydene Laming, PT, DPT   09/25/2012, 2:48 PM

## 2012-09-25 NOTE — Progress Notes (Signed)
Pt stated she would place home CPAP nasal pillows at bedtime.

## 2012-09-25 NOTE — Significant Event (Signed)
Hypoglycemic Event  CBG: 57  Treatment: 15 GM carbohydrate snack - Patient given scheduled tube feeding  Symptoms: None  Follow-up CBG: [id number] CBG Result:88  Possible Reasons for Event: Unknown  Comments/MD notified:Dr Riley Kill notifed of results. No new orders received    Doren Kaspar, Sylvie Farrier  Remember to initiate Hypoglycemia Order Set & complete

## 2012-09-25 NOTE — Significant Event (Signed)
Hypoglycemic Event  CBG: 53   Treatment: 15 GM carbohydrate snack  Symptoms: None  Follow-up CBG: Time:0821 CBG Result:57  Possible Reasons for Event: Unknown  Comments/MD notified:Dr Riley Kill notified     [name], [name]  Remember to initiate Hypoglycemia Order Set & complete

## 2012-09-26 ENCOUNTER — Inpatient Hospital Stay (HOSPITAL_COMMUNITY): Payer: 59 | Admitting: Occupational Therapy

## 2012-09-26 LAB — GLUCOSE, CAPILLARY
Glucose-Capillary: 64 mg/dL — ABNORMAL LOW (ref 70–99)
Glucose-Capillary: 75 mg/dL (ref 70–99)
Glucose-Capillary: 116 mg/dL — ABNORMAL HIGH (ref 70–99)
Glucose-Capillary: 121 mg/dL — ABNORMAL HIGH (ref 70–99)

## 2012-09-26 LAB — BASIC METABOLIC PANEL
CO2: 25 mEq/L (ref 19–32)
Glucose, Bld: 80 mg/dL (ref 70–99)
Potassium: 4.4 mEq/L (ref 3.5–5.1)
Sodium: 139 mEq/L (ref 135–145)

## 2012-09-26 MED ORDER — WARFARIN SODIUM 2.5 MG PO TABS
2.5000 mg | ORAL_TABLET | Freq: Every day | ORAL | Status: DC
  Administered 2012-09-26 – 2012-09-27 (×2): 2.5 mg via ORAL
  Filled 2012-09-26 (×3): qty 1

## 2012-09-26 NOTE — Plan of Care (Signed)
Problem: RH BLADDER ELIMINATION Goal: RH STG MANAGE BLADDER WITH EQUIPMENT WITH ASSISTANCE STG Manage Bladder With Equipment With Assistance . Mod I  Outcome: Not Progressing I/O cath performed D/T urinary retention

## 2012-09-26 NOTE — Progress Notes (Signed)
ANTICOAGULATION CONSULT NOTE - Follow Up Consult  Pharmacy Consult for coumadin Indication: atrial fibrillation  Allergies  Allergen Reactions  . Olmesartan Medoxomil Cough    REACTION: ? if cough  . Tetracycline Hcl     Unknown reaction, too long for patient to remember   . Venlafaxine     REACTION: severe dry moouth  . Adhesive (Tape) Rash    Patient Measurements: Height: 5' 4.17" (163 cm) Weight: 168 lb 10.4 oz (76.5 kg) IBW/kg (Calculated) : 55.1 Heparin Dosing Weight:   Vital Signs: Temp: 98.1 F (36.7 C) (05/18 0608) Temp src: Oral (05/18 0608) BP: 115/63 mmHg (05/18 1009) Pulse Rate: 67 (05/18 1009)  Labs:  Recent Labs  09/24/12 0500 09/25/12 0500 09/26/12 0440  LABPROT 25.4* 23.8* 25.2*  INR 2.44* 2.24* 2.42*  CREATININE 0.52 0.57 0.53    Estimated Creatinine Clearance: 77.1 ml/min (by C-G formula based on Cr of 0.53).   Medications:  Scheduled:  . antiseptic oral rinse  15 mL Mouth Rinse BID  . buPROPion  150 mg Oral q morning - 10a  . clonazePAM  0.25 mg Oral BID  . escitalopram  20 mg Oral QHS  . feeding supplement (OSMOLITE 1.5 CAL)  237 mL Per Tube Custom  . feeding supplement (OSMOLITE 1.5 CAL)  360 mL Per Tube BID WC  . furosemide  40 mg Oral Daily  . insulin aspart  0-9 Units Subcutaneous TID WC  . letrozole  2.5 mg Oral Daily  . levothyroxine  125 mcg Oral QAC breakfast  . loperamide  2 mg Oral TID  . metoprolol tartrate  25 mg Oral BID  . multivitamin with minerals  1 tablet Oral Daily  . OLANZapine  2.5 mg Oral QHS  . potassium chloride  20 mEq Oral BID  . spironolactone  25 mg Oral Daily  . Warfarin - Pharmacist Dosing Inpatient   Does not apply q1800   Infusions:    Assessment: 57 yo female with afib is currently on therapeutic coumadin. INR looks like it's stabilizing. Will try to give a standing dose.   Goal of Therapy:  INR 2-3   Plan:   Coumadin 2.5mg  PO qday Cont daily INR

## 2012-09-26 NOTE — Significant Event (Addendum)
Hypoglycemic Event  CBG:64  Treatment: 15 GM carbohydrate snack and tube feeding  Symptoms: None  Follow-up CBG: [id number] CBG Result:75  Possible Reasons for Event: Unknown  Comments/MD notified:MD Riley Kill notified. No new orders received    Larena Ohnemus, Sylvie Farrier  Remember to initiate Hypoglycemia Order Set & complete

## 2012-09-26 NOTE — Progress Notes (Signed)
Subjective/Complaints: Low cbg this am. Up out of the room yesterday with her husband which she enjoyed.  A 12 point review of systems has been performed and if not noted above is otherwise negative.   Objective: Vital Signs: Blood pressure 113/50, pulse 68, temperature 98.1 F (36.7 C), temperature source Oral, resp. rate 18, height 5' 4.17" (1.63 m), weight 76.5 kg (168 lb 10.4 oz), SpO2 97.00%. No results found. No results found for this basename: WBC, HGB, HCT, PLT,  in the last 72 hours  Recent Labs  09/25/12 0500 09/26/12 0440  NA 138 139  K 4.2 4.4  CL 109 108  GLUCOSE 61* 80  BUN 9 9  CREATININE 0.57 0.53  CALCIUM 7.7* 7.9*   CBG (last 3)   Recent Labs  09/25/12 1616 09/25/12 2109 09/26/12 0722  GLUCAP 93 115* 64*    Wt Readings from Last 3 Encounters:  09/26/12 76.5 kg (168 lb 10.4 oz)  09/08/12 97.977 kg (216 lb)  09/06/12 97.6 kg (215 lb 2.7 oz)    Physical Exam:  Constitutional: She is oriented to person, place, and time. She appears well-developed. Appears fatigued. obese  HENT: oral mucosa dry. Crusted/blood at corners of mouth. Dry blood on cover. Head: Normocephalic and atraumatic.  Eyes: Pupils are equal, round, and reactive to light.  Neck: Normal range of motion.  Cardiovascular: Normal rate and regular rhythm. No murmurs  Pulmonary/Chest: breathing comfortably. Still with Rales bilaterally Abdominal: Soft. Bowel sounds are normal. She exhibits no distension. There is no tenderness.  PEG in place without drainage.  Musculoskeletal: She exhibits persistent edema (BLE with trace to 1+).  Foam dressing bilateral feet. --only mild scabs on legs.  Neurological: She is alert and oriented to person, place, and time.    Cognitively with basic insight and awareness. Mentation improved. Motor: 4/5 in bilateral deltoid, biceps, triceps3+/5 in bilateral hip flexor knee extensor ankle dorsiflexor and plantar flexor  Sensory to light touch is intact in the  upper and lower limbs  Speech is clear. No language deficits Psych: pleasant, less anxious    Assessment/Plan: 1. Functional deficits secondary to deconditioning/FTT after breast cancer and associate complications  which require 3+ hours per day of interdisciplinary therapy in a comprehensive inpatient rehab setting. Physiatrist is providing close team supervision and 24 hour management of active medical problems listed below. Physiatrist and rehab team continue to assess barriers to discharge/monitor patient progress toward functional and medical goals.   Improving mentally and physically!   FIM: FIM - Bathing Bathing Steps Patient Completed: Chest;Right Arm;Left Arm;Abdomen;Right upper leg;Left upper leg;Front perineal area;Buttocks Bathing: 4: Min-Patient completes 8-9 34f 10 parts or 75+ percent  FIM - Upper Body Dressing/Undressing Upper body dressing/undressing steps patient completed: Thread/unthread right sleeve of pullover shirt/dresss;Thread/unthread left sleeve of pullover shirt/dress;Put head through opening of pull over shirt/dress;Pull shirt over trunk Upper body dressing/undressing: 5: Supervision: Safety issues/verbal cues FIM - Lower Body Dressing/Undressing Lower body dressing/undressing steps patient completed: Thread/unthread left underwear leg;Pull underwear up/down;Thread/unthread left pants leg;Pull pants up/down Lower body dressing/undressing: 3: Mod-Patient completed 50-74% of tasks  FIM - Toileting Toileting steps completed by patient: Adjust clothing prior to toileting;Performs perineal hygiene;Adjust clothing after toileting Toileting Assistive Devices: Grab bar or rail for support Toileting: 6: More than reasonable amount of time  FIM - Diplomatic Services operational officer Devices: Grab bars Toilet Transfers: 4-To toilet/BSC: Min A (steadying Pt. > 75%);4-From toilet/BSC: Min A (steadying Pt. > 75%)  FIM - Press photographer  Assistive Devices: Arm rests;Walker Bed/Chair Transfer: 3: Bed > Chair or W/C: Mod A (lift or lower assist);3: Chair or W/C > Bed: Mod A (lift or lower assist)  FIM - Locomotion: Wheelchair Distance: 80 Locomotion: Wheelchair: 1: Total Assistance/staff pushes wheelchair (Pt<25%) FIM - Locomotion: Ambulation Locomotion: Ambulation Assistive Devices: Designer, industrial/product Ambulation/Gait Assistance: 4: Min assist Locomotion: Ambulation: 1: Travels less than 50 ft with minimal assistance (Pt.>75%)  Comprehension Comprehension Mode: Auditory Comprehension: 5-Follows basic conversation/direction: With extra time/assistive device  Expression Expression Mode: Verbal Expression: 5-Expresses basic needs/ideas: With extra time/assistive device  Social Interaction Social Interaction: 5-Interacts appropriately 90% of the time - Needs monitoring or encouragement for participation or interaction.  Problem Solving Problem Solving: 5-Solves complex 90% of the time/cues < 10% of the time  Memory Memory: 5-Recognizes or recalls 90% of the time/requires cueing < 10% of the time  Medical Problem List and Plan:  1. DVT Prophylaxis/Anticoagulation: On coumadin for atrial fibrillation.  2. Pain Management: Morphine prn  3. Depression: Team to provide ego support. To continue Wellbutrin, Lexapro and Zyprexa. Will have LCSW follow for support. neuropsych eval pending   -klonopin helpful  for anxiety 4. Neuropsych: This patient is capable of making decisions on her own behalf.  5. HTN: Monitor on bid basis. Lasix, lopressor 6. OSA: continue CPAP at nights.  7. Cirrhosis of liver-autoimmune?:  8. Atrial Fibrillation with RVR/CHF: Will continue to monitor HR on bid basis. Has been off Cardizem. Continue metoprolol. Amiodarone per cards  -lasix 40QD, aldactone  -foley out.  -follow up cxr stable  -appreciate cardiology assist  -proteint malnutrition---TEDS for edema control, elevation 9.Anemia: remains  around 8  -continue to follow serially   10. DM type 2-diet controlled: Regular diet for now to help with food choices and appetite. Continue TF 6 X day and monitor intake.  11. Breast cancer: On Femara. For XRT once stronger. Patient to follow up with Dr. Michell Heinrich aftert discharge 12. Diarrhea:  -labs ok   -needs scheduled imodium 13. FEN-   -TF as above  -no appetite despite marinol---dc 14. Klebsiella UTI- rx'ed  LOS (Days) 13 A FACE TO FACE EVALUATION WAS PERFORMED  Ridwan Bondy T 09/26/2012 8:08 AM

## 2012-09-26 NOTE — Progress Notes (Signed)
Occupational Therapy Session Note  Patient Details  Name: [name] MRN: [id number] Date of Birth: Jan 26, 1956  Today's Date: 09/26/2012 Time: 0730-0830 Time Calculation (min): 60 min  Skilled Therapeutic Interventions/Progress Updates: Patient glucose was 62 upon arrival and was given her tube feeding and juice by RN.  When checked 30 minutes later, glucose had risen to 75.  Though glucose was low, patient was able to complete ADL therapy with overall S except required help to don on ted hose over sore heels and legs.   Patient very fatigued with bed mobility, transfers and all movements, but tolerated the activities very well.  Husband was present upon arrival but left to go home to do some work when treatment started.  Husband spoke very supportive and jovial words to his wife.     Therapy Documentation Precautions:  Precautions Precautions: Fall Precaution Comments: monitor HR and O2 sats on RA Restrictions Weight Bearing Restrictions: No  Pain:denied  See FIM for current functional status  Therapy/Group: Individual Therapy  Bud Face [facility] 09/26/2012, 7:44 AM

## 2012-09-27 ENCOUNTER — Inpatient Hospital Stay (HOSPITAL_COMMUNITY): Payer: 59 | Admitting: Occupational Therapy

## 2012-09-27 ENCOUNTER — Inpatient Hospital Stay (HOSPITAL_COMMUNITY): Payer: 59

## 2012-09-27 DIAGNOSIS — E669 Obesity, unspecified: Secondary | ICD-10-CM

## 2012-09-27 DIAGNOSIS — F341 Dysthymic disorder: Secondary | ICD-10-CM

## 2012-09-27 DIAGNOSIS — C50919 Malignant neoplasm of unspecified site of unspecified female breast: Secondary | ICD-10-CM

## 2012-09-27 DIAGNOSIS — R5381 Other malaise: Secondary | ICD-10-CM

## 2012-09-27 LAB — URINE CULTURE

## 2012-09-27 LAB — CBC
Hemoglobin: 8.9 g/dL — ABNORMAL LOW (ref 12.0–15.0)
MCH: 27.7 pg (ref 26.0–34.0)
RBC: 3.21 MIL/uL — ABNORMAL LOW (ref 3.87–5.11)
WBC: 8.8 10*3/uL (ref 4.0–10.5)

## 2012-09-27 LAB — BASIC METABOLIC PANEL
BUN: 10 mg/dL (ref 6–23)
Calcium: 8 mg/dL — ABNORMAL LOW (ref 8.4–10.5)
GFR calc non Af Amer: >90 mL/min (ref >90)
Glucose, Bld: 84 mg/dL (ref 70–99)

## 2012-09-27 LAB — GLUCOSE, CAPILLARY
Glucose-Capillary: 109 mg/dL — ABNORMAL HIGH (ref 70–99)
Glucose-Capillary: 74 mg/dL (ref 70–99)
Glucose-Capillary: 79 mg/dL (ref 70–99)

## 2012-09-27 MED ORDER — SACCHAROMYCES BOULARDII 250 MG PO CAPS
250.0000 mg | ORAL_CAPSULE | Freq: Two times a day (BID) | ORAL | Status: DC
  Administered 2012-09-27 – 2012-09-28 (×3): 250 mg via ORAL
  Filled 2012-09-27 (×5): qty 1

## 2012-09-27 MED ORDER — OSMOLITE 1.5 CAL PO LIQD
237.0000 mL | ORAL | Status: DC
  Administered 2012-09-27 – 2012-09-28 (×2): 237 mL
  Filled 2012-09-27 (×6): qty 237

## 2012-09-27 MED ORDER — OSMOLITE 1.5 CAL PO LIQD
360.0000 mL | Freq: Two times a day (BID) | ORAL | Status: DC
  Administered 2012-09-27 (×2): 360 mL
  Filled 2012-09-27 (×4): qty 474

## 2012-09-27 MED ORDER — MAGIC MOUTHWASH W/LIDOCAINE
5.0000 mL | Freq: Three times a day (TID) | ORAL | Status: DC
  Administered 2012-09-27 – 2012-09-28 (×3): 5 mL via ORAL
  Filled 2012-09-27 (×7): qty 5

## 2012-09-27 NOTE — Progress Notes (Signed)
ANTICOAGULATION CONSULT NOTE - Follow Up Consult  Pharmacy Consult for coumadin Indication: atrial fibrillation  Allergies  Allergen Reactions  . Olmesartan Medoxomil Cough    REACTION: ? if cough  . Tetracycline Hcl     Unknown reaction, too long for patient to remember   . Venlafaxine     REACTION: severe dry moouth  . Adhesive (Tape) Rash    Patient Measurements: Height: 5' 4.17" (163 cm) Weight: 166 lb 14.2 oz (75.7 kg) IBW/kg (Calculated) : 55.1 Heparin Dosing Weight:   Vital Signs: Temp: 98 F (36.7 C) (05/19 0630) Temp src: Oral (05/19 0630) BP: 120/63 mmHg (05/19 0630) Pulse Rate: 72 (05/19 0630)  Labs:  Recent Labs  09/25/12 0500 09/26/12 0440 09/27/12 0530  LABPROT 23.8* 25.2* 24.1*  INR 2.24* 2.42* 2.28*  CREATININE 0.57 0.53 0.51    Estimated Creatinine Clearance: 76.6 ml/min (by C-G formula based on Cr of 0.51).   Medications:  Scheduled:  . antiseptic oral rinse  15 mL Mouth Rinse BID  . buPROPion  150 mg Oral q morning - 10a  . clonazePAM  0.25 mg Oral BID  . escitalopram  20 mg Oral QHS  . feeding supplement (OSMOLITE 1.5 CAL)  237 mL Per Tube Custom  . feeding supplement (OSMOLITE 1.5 CAL)  360 mL Per Tube BID WC  . furosemide  40 mg Oral Daily  . letrozole  2.5 mg Oral Daily  . levothyroxine  125 mcg Oral QAC breakfast  . loperamide  2 mg Oral TID  . metoprolol tartrate  25 mg Oral BID  . multivitamin with minerals  1 tablet Oral Daily  . OLANZapine  2.5 mg Oral QHS  . potassium chloride  20 mEq Oral BID  . spironolactone  25 mg Oral Daily  . warfarin  2.5 mg Oral q1800  . Warfarin - Pharmacist Dosing Inpatient   Does not apply q1800   Infusions:    Assessment: 57 yo female with afib is currently on therapeutic coumadin.  INR is down a little to 2.28.  Goal of Therapy:  INR 2-3    Plan:  1) Continue coumadin 2.5mg  po qday 2) INR in am  Normagene Harvie, Tsz-Yin 09/27/2012,8:11 AM

## 2012-09-27 NOTE — Progress Notes (Signed)
Occupational Therapy Session Notes  Patient Details  Name: [name] MRN: [id number] Date of Birth: February 07, 1956  Today's Date: 09/27/2012  Short Term Goals: Week 1:  OT Short Term Goal 1 (Week 1): Patient will perform toilet transfer with moderate assistance using DME & AD prn OT Short Term Goal 1 - Progress (Week 1): Met OT Short Term Goal 2 (Week 1): Patient will bathe 10/10 body parts with moderate assistance in sit<>stand position using AE prn OT Short Term Goal 2 - Progress (Week 1): Met OT Short Term Goal 3 (Week 1): Patient will perform LB dsg (pants only) with minimal assistance in sit<>stand position OT Short Term Goal 3 - Progress (Week 1): Met OT Short Term Goal 4 (Week 1): Patient will donn shirt with minimal assistance, seated un-supported OT Short Term Goal 4 - Progress (Week 1): Met OT Short Term Goal 5 (Week 1): AE for LB ADLs will be introduced to patient OT Short Term Goal 5 - Progress (Week 1): Met  Week 2:  OT Short Term Goal 1 (Week 2): STGs=LTGs  Skilled Therapeutic Interventions/Progress Updates:   Session #1 [phone] - 55 Minutes Individual Therapy No complaints of pain Patient found supine in bed with husband present in room. Husband assisted patient with donning of TEDs. Patient then engaged in bed mobility with supervision. From edge of bed, patient ambulated -> bathroom for toilet transfer and toileting with husband providing supervision. From there patient sat at sink in w/c for grooming tasks. Patient refused bathing & dressing, stating she already completed those tasks this am. Patient ambulated from room -> ADL apartment with rolling walker (240 feet) with supervision from husband. In ADL apartment, patient engaged in furniture transfer on/off couch and tub/shower transfer on/off tub transfer bench. Main focus of session was on family education to patient's husband. Patient's husband has been checked off to assist patient with transfers and functional  ambulation throughout room. Patient on room air entire session. At end of session, left patient seated in w/c with husband present in room.   Session #2 1305-1400 - 55 Minutes Individual Therapy No complaints of pain Patient found seated in w/c. Patient ambulated from room -> ADL apartment. Patient then requested to sit in w/c. Patient's husband propelled patient -> therapy gym. Once in therapy gym engaged patient in therapeutic activity focusing on dynamic standing balance/tolerance/endurance, functional mobility using rolling walker, and overall activity tolerance/endurance. In middle of session patient requested to use bathroom. Propelled patient -> ADL apartment and patient ambulated into bathroom for toilet transfer and toileting, all with supervision (washing hands as well). From there patient propelled self back to gym in w/c and therapist left patient seated on nustep until next therapy session. Therapist administered walker bag and reacher bag.   Precautions:  Precautions Precautions: Fall Precaution Comments: monitor HR and O2 sats on RA Restrictions Weight Bearing Restrictions: No  See FIM for current functional status  Shubham Thackston 09/27/2012, 7:29 AM

## 2012-09-27 NOTE — Evaluation (Signed)
Recreational Therapy Assessment and Plan  Patient Details  Name: [name] MRN: [id number] Date of Birth: May 02, 1956 Today's Date:  Rehab Potential: Good ELOS: 2 weeks   LATE ENTRY: from 09/16/12 Assessment Clinical Impression: Problem List:  Patient Active Problem List    Diagnosis  Date Noted   .  Physical deconditioning  09/13/2012   .  DJD (degenerative joint disease) of lumbar spine    .  Malnutrition  09/07/2012   .  CHF, acute  08/25/2012   .  Cellulitis of leg, left  08/25/2012   .  Anasarca  08/24/2012   .  UTI (lower urinary tract infection)  08/24/2012   .  Diarrhea  08/06/2012   .  Hyperkalemia  08/06/2012   .  Acute renal insufficiency  07/23/2012   .  Edema  07/23/2012   .  A-fib  07/14/2012   .  Hypokalemia  07/13/2012   .  Hyponatremia  07/13/2012   .  Obesity, unspecified  07/13/2012   .  Severe malnutrition in the context of acute illness  07/13/2012   .  Neutropenic fever  07/12/2012   .  Microcytic anemia  07/12/2012   .  Thrombocytopenia  07/12/2012   .  Cancer of breast  05/27/2012   .  Dizziness  07/28/2011   .  Congestion of nasal sinus  07/28/2011   .  Allergic rhinitis, cause unspecified  07/28/2011   .  Dyspnea on exertion  09/27/2010   .  Liver disease, chronic, with cirrhosis    .  Morbid obesity  08/03/2009   .  IRON DEFICIENCY  06/08/2009   .  DIABETES MELLITUS, TYPE II  10/16/2008   .  Mixed disorders as reaction to stress  10/16/2008   .  FATTY LIVER DISEASE  09/28/2008   .  SPLENOMEGALY  09/28/2008   .  Unspecified vitamin D deficiency  08/16/2008   .  FATIGUE  08/16/2008   .  POSITIVE PPD  12/01/2007   .  ARTHRITIS  03/30/2007   .  HYPOTHYROIDISM  03/24/2007   .  OBSTRUCTIVE SLEEP APNEA  03/24/2007   .  HYPERGLYCEMIA  03/24/2007   .  HYPERLIPIDEMIA  10/06/2006   .  DEPRESSION  10/06/2006   .  HYPERTENSION  10/06/2006   .  GERD  10/06/2006   .  LIVER FUNCTION TESTS, ABNORMAL  10/06/2006   .  DVT, HX OF  10/06/2006     Past Medical History:  Past Medical History   Diagnosis  Date   .  Depression    .  DVT (deep venous thrombosis)      hx of on HRT left leg ~2006   .  GERD (gastroesophageal reflux disease)    .  Hyperlipidemia    .  Hypertension    .  Hypothyroidism    .  PPD positive, treated      rx inh   .  OSA on CPAP    .  Diabetes mellitus    .  Liver disease, chronic, with cirrhosis      ? autoimmune   .  Breast cancer      a. 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  DJD (degenerative joint disease) of lumbar spine     Past Surgical History:  Past Surgical History   Procedure  Laterality  Date   .  Tubal ligation     .  Cholecystectomy     .  Foot surgery     .  Eye surgery     .  Cataract extraction     .  Percutaneous liver biopsy     .  Breast biopsy       left breast   .  Mastectomy modified radical   06/08/2012     Procedure: MASTECTOMY MODIFIED RADICAL; Surgeon: Mariella Saa, MD; Location: [facility] OR; Service: General; Laterality: Right;   .  Portacath placement   06/08/2012     Procedure: INSERTION PORT-A-CATH; Surgeon: Mariella Saa, MD; Location: [facility] OR; Service: General; Laterality: Left;   .  Peg tube placement      Clinical Impression:[name] is a 57 y.o. female with history of breast cancer (diagnosed 01/14) with intolerance of chemo, Atrial fibrillation, malnutrition requiring PEG 08/27/12 during recent admission,;who was discharged to home 09/06/12. Family reported difficulty getting patient out of the car and into the home due to inability walk and BLE weakness. She was readmitted on 04/29 with severely depressed mood, complaints of weakness and refusal to continue any further interventions. Palliative care and psychiatary consulted for assistance and patient was agreeable to start medications to help with mood stabilization as well as continued medical  interventions. She was started on Zyprexa to help with insomnia and lexapro dose increased to help with mood stabilization. XRT to be initiated in the future. Po intake remains poor but she is tolerating TF without recurrent diarrhea. Therapies initiated and CIR recommended.Patient transferred to CIR on 09/13/2012.  Pt presents with decreased activity tolerance, decreased functional mobility, decreased balance, anxiety Limiting pt's independence with leisure/community pursuits.  Plan Min 1 time per week >20 minutes Recommendations for other services: Neuropsych  Discharge Criteria: Patient will be discharged from TR if patient refuses treatment 3 consecutive times without medical reason.  If treatment goals not met, if there is a change in medical status, if patient makes no progress towards goals or if patient is discharged from hospital.  The above assessment, treatment plan, treatment alternatives and goals were discussed and mutually agreed upon: by patient  Brandun Pinn 09/27/2012, 11:50 AM

## 2012-09-27 NOTE — Progress Notes (Signed)
Physical Therapy Discharge Summary  Patient Details  Name: [name] MRN: [id number] Date of Birth: 05-06-1956  Today's Date: 09/27/2012  Session #1 Time: 4098-1191 Time Calculation (min): 45 min Individual therapy; Session focused on family education with pt's husband and completing grad day activities including simulated car transfer with S (pt able to manage LE's independently without use of leg lifter today), bed mobility in ADL apartment (again, not needing leg lifter and able to bring LE's up/down off the bed ) with S but demonstrated to husband the appropriate technique if pt were to need assistance, up/down ramp with RW for home entry, stair negotiation (min A today and able to go up/down 5 steps with bilateral rails), and reviewed [place] HEP with pt and husband. D/c planning discussed with follow up therapy and importance of continuing mobility /therapy upon d/c to continue to improve strength, endurance, etc. Pt gait back to room with RW with S; and pt's husband assisted pt with toileting upon return to the room. Returned to bed after toileting to rest before next session. Pt overall S and excellent improvement with endurance noted today.  Session #2 Time: 1415-1445 (30 min) Individual therapy; Denies pain. Session focused on community reintegration and endurance activity in the gift shop navigating obstacles and picking up items; overall S and able to walk the whole time without rest breaks. Husband and friend present as well and pleased with pt's status. Both feel prepared for d/c tomorrow.   Patient has met 7 of 7 long term goals due to improved activity tolerance, improved balance, increased strength, increased range of motion and ability to compensate for deficits.  Patient to discharge at an ambulatory level Supervision using RW. Patient's husband  is independent to provide the necessary physical and supervision assistance at discharge and successfully completed family  education.  Reasons goals not met: n/a  Recommendation:  Patient will benefit from ongoing skilled PT services in home health setting to continue to advance safe functional mobility, address ongoing impairments in gait, endurance, functional strength, balance, edema, and minimize fall risk.  Equipment: Hospital Bed. Pt already owns RW and w/c.  Reasons for discharge: treatment goals met and discharge from hospital  Patient/family agrees with progress made and goals achieved: Yes  PT Discharge Precautions/Restrictions Precautions Precautions: Fall Restrictions Weight Bearing Restrictions: No Vision/Perception  Perception Perception: Within Functional Limits Praxis Praxis: Intact  Cognition Overall Cognitive Status: Within Functional Limits for tasks assessed Safety/Judgment:  (cues at times to lock brakes; safety with transfers) Sensation Sensation Light Touch: Appears Intact Proprioception: Appears Intact Coordination Gross Motor Movements are Fluid and Coordinated: Yes Motor  Motor Motor: Within Functional Limits    Locomotion  Ambulation Ambulation/Gait Assistance: 5: Supervision Assistive device: Rolling walker  Trunk/Postural Assessment  Cervical Assessment Cervical Assessment: Within Functional Limits Thoracic Assessment Thoracic Assessment:  (mild kyphosis) Lumbar Assessment Lumbar Assessment:  (sits in posterior pelvic tilt) Postural Control Postural Control: Within Functional Limits  Balance Balance Balance Assessed: Yes Static Sitting Balance Static Sitting - Level of Assistance: 6: Modified independent (Device/Increase time) Dynamic Sitting Balance Dynamic Sitting - Level of Assistance: 6: Modified independent (Device/Increase time) Static Standing Balance Static Standing - Level of Assistance: 6: Modified independent (Device/Increase time) Dynamic Standing Balance Dynamic Standing - Level of Assistance: 5: Stand by assistance Extremity  Assessment  RUE Assessment RUE Assessment: Within Functional Limits LUE Assessment LUE Assessment: Within Functional Limits RLE Assessment RLE Assessment: Exceptions to [facility] (grossly 4/5; dec muscular endurace) LLE Assessment LLE Assessment: Exceptions  to [facility] (grossly 4/5; decreased muscular endurance)  See FIM for current functional status  [name] [facility] 09/27/2012, 3:58 PM

## 2012-09-27 NOTE — Progress Notes (Signed)
Occupational Therapy Discharge Summary  Patient Details  Name: [name] MRN: [id number] Date of Birth: 1955/09/15  Today's Date: 09/27/2012  Patient has met 10 of 12 long term goals due to improved activity tolerance, improved balance, postural control, ability to compensate for deficits, improved attention, improved awareness and improved coordination.  Patient to discharge at overall Supervision level.  Patient's care partner is independent to provide the necessary supervision and occasional minimal assistance prn assistance at discharge.  Patient has made great gains on CIR during this admission. Patient is on room air and 02 sats remain <90%.   Reasons goals not met: Patient did not meet simple meal prep goal of supervision or furniture transfer goal of supervision. Patient fluctuates of level of assistance needed during furniture transfers depending on height of surface, husband is independent with assisting patient prn. Patient did not meet simple meal prep secondary to simple meal prep not accomplished/worked on at this time. Patient, however, is supervision with various functional mobility and dynamic standing tasks in order to safely (at supervision level) complete cooking tasks, using rolling walker, at home.  Recommendation:  Patient will benefit from ongoing skilled OT services in home health setting to continue to advance functional skills in the area of BADL, iADL and Reduce care partner burden.  Equipment: tub transfer bench  Reasons for discharge: treatment goals met and discharge from hospital  Patient/family agrees with progress made and goals achieved: Yes  Precautions/Restrictions  Precautions Precautions: Fall Restrictions Weight Bearing Restrictions: No  ADL - See FIM  Vision/Perception  Vision - History Patient Visual Report: No change from baseline Vision - Assessment Eye Alignment: Within Functional Limits Perception Perception: Within Functional  Limits Praxis Praxis: Intact   Cognition Overall Cognitive Status: Within Functional Limits for tasks assessed Orientation Level: Oriented X4 Memory: Impaired Awareness: Appears intact Problem Solving: Appears intact Safety/Judgment: Appears intact Comments: Patient continues to present with anxiety, however has improved since admission  Sensation Sensation Light Touch: Appears Intact Proprioception: Appears Intact Additional Comments: BUEs appear intact Coordination Gross Motor Movements are Fluid and Coordinated: Yes Fine Motor Movements are Fluid and Coordinated: Yes  Motor  Motor Motor: Within Functional Limits  Trunk/Postural Assessment  Cervical Assessment Cervical Assessment: Within Functional Limits Thoracic Assessment Thoracic Assessment:  (mild kyphosis) Lumbar Assessment Lumbar Assessment:  (sits in posterior pelvic tilt) Postural Control Postural Control: Within Functional Limits   Balance Balance Balance Assessed: Yes Static Sitting Balance Static Sitting - Level of Assistance: 6: Modified independent (Device/Increase time) Dynamic Sitting Balance Dynamic Sitting - Level of Assistance: 6: Modified independent (Device/Increase time) Static Standing Balance Static Standing - Level of Assistance: 6: Modified independent (Device/Increase time) Dynamic Standing Balance Dynamic Standing - Level of Assistance: 5: Stand by assistance  Extremity/Trunk Assessment RUE Assessment RUE Assessment: Within Functional Limits LUE Assessment LUE Assessment: Within Functional Limits  See FIM for current functional status  Eliannah Hinde 09/27/2012, 3:31 PM

## 2012-09-27 NOTE — Progress Notes (Signed)
Came to visit with patient at bedside. However, she was out of the room. [facility] Care Management will follow patient telephonically upon discharge.  Raiford Noble, MSN-Ed, RN,BSN, [facility], [phone]

## 2012-09-27 NOTE — Progress Notes (Signed)
NUTRITION FOLLOW UP  DOCUMENTATION CODES  Per approved criteria   -Severe malnutrition in the context of chronic illness    Intervention:   1. Continue 360 ml Osmolite 1.5 BID and 240 ml Osmolite 1.5 BID, a total of 5 cans daily. Total regimen provides: 1775 kcal, 75 grams protein, 905 ml free water, 1650 mg of sodium. 2. RD to continue to follow nutrition care plan  Nutrition Dx:   Inadequate oral intake related to poor appetite as evidenced by pt with PEG and TF as main source of nutrition. Ongoing.  Goal:   Intake to meet at least 90% of estimated needs. Met.  Monitor:   weight trends, lab trends, I/O's, PO intake, TF tolerance  Assessment:   Pt with hx of metastatic breast CA s/p right mastectomy, DM, HTN with progressively worsening weakness since diagnosis and treatment of CA in January 2014. Admitted to rehab with deconditioning d/t multiple medical issues. Pt with history of severe d/v/n, very limited PO intake. Pt with no desire to eat x 2 months. Pt had PEG placed during previous admission and pt was started on continuous infusion of Jevity 1.2 then switched to bolus TF. Pt was getting of Jevity 1.2 via PEG 4 times/day PTA which provides 1728 calories, 80 g protein, 1162 ml free water. Pt reports she was tolerating this TF well at home without n/v.  Current regimen is 360 ml Osmolite 1.5 BID and 240 ml Osmolite 1.5 BID.  Working towards d/c date of 5/20. MD added marinol 5/8 for appetite (pt has tried megace in the past.) Marinol discontinued 5/18 as pt with ongoing poor appetite despite this medication. Pt noted that her tongue is a little "sore" at times, ordered for Magic Mouthwash. Continues on lasix daily. Cardiology has signed off.  Continues with Regular diet order, 1 L fluid restriction, 2 gram sodium. RN reports that pt is continuing to not eat - husband consumes meal trays.  Pt meets criteria for SEVERE MALNUTRITION in the context of chronic as evidenced by <75%  of estimated energy requirements for 2 months and edema.  Height: Ht Readings from Last 1 Encounters:  09/19/12 5' 4.17" (1.63 m)    Weight Status:   Wt Readings from Last 1 Encounters:  09/27/12 166 lb 14.2 oz (75.7 kg)  Wt is trending down (admit wt 217 lb --> net I/O's is -35 liters since admission)  Re-estimated needs:  Kcal: 1650 - 1950 Protein: 85 - 100 grams Fluid: 1.8 - 2 liters daily  Skin: stage II on L and R buttocks  Diet Order: General   Intake/Output Summary (Last 24 hours) at 09/27/12 1248 Last data filed at 09/27/12 1222  Gross per 24 hour  Intake    120 ml  Output   1925 ml  Net  -1805 ml    Last BM: 5/18   Labs:   Recent Labs Lab 09/25/12 0500 09/26/12 0440 09/27/12 0530  NA 138 139 136  K 4.2 4.4 4.2  CL 109 108 104  CO2 23 25 26   BUN 9 9 10   CREATININE 0.57 0.53 0.51  CALCIUM 7.7* 7.9* 8.0*  GLUCOSE 61* 80 84    CBG (last 3)   Recent Labs  09/26/12 2046 09/27/12 0713 09/27/12 1123  GLUCAP 91 109* 74    Scheduled Meds: . antiseptic oral rinse  15 mL Mouth Rinse BID  . buPROPion  150 mg Oral q morning - 10a  . clonazePAM  0.25 mg Oral BID  .  escitalopram  20 mg Oral QHS  . feeding supplement (OSMOLITE 1.5 CAL)  237 mL Per Tube Custom  . feeding supplement (OSMOLITE 1.5 CAL)  360 mL Per Tube BID WC  . furosemide  40 mg Oral Daily  . letrozole  2.5 mg Oral Daily  . levothyroxine  125 mcg Oral QAC breakfast  . loperamide  2 mg Oral TID  . magic mouthwash w/lidocaine  5 mL Oral TID  . metoprolol tartrate  25 mg Oral BID  . multivitamin with minerals  1 tablet Oral Daily  . OLANZapine  2.5 mg Oral QHS  . potassium chloride  20 mEq Oral BID  . saccharomyces boulardii  250 mg Oral BID  . spironolactone  25 mg Oral Daily  . warfarin  2.5 mg Oral q1800  . Warfarin - Pharmacist Dosing Inpatient   Does not apply q1800    Continuous Infusions:  none  Jarold Motto MS, RD, LDN Pager: [phone] After-hours pager:  [phone]

## 2012-09-27 NOTE — Progress Notes (Signed)
Social Work Patient ID: [name], female   DOB: 01-22-1956, 57 y.o.   MRN: [id number] Met with pt and husband to discuss recommendation of follow up for assistance with her coping.  Both were agreeable and only concern is co-pays. [facility] to see [facility] regarding list of counselors.  Will follow up with this and try to arrange appt for pt to follow up with At discharge.

## 2012-09-27 NOTE — Progress Notes (Signed)
Subjective/Complaints: Ate some salad yesterday. Excited to be going home. Feeling much stronger. Tongue a little "sore" at times, husband worries this might be affecting her taste/appetite  A 12 point review of systems has been performed and if not noted above is otherwise negative.   Objective: Vital Signs: Blood pressure 120/63, pulse 72, temperature 98 F (36.7 C), temperature source Oral, resp. rate 18, height 5' 4.17" (1.63 m), weight 75.7 kg (166 lb 14.2 oz), SpO2 96.00%. No results found. No results found for this basename: WBC, HGB, HCT, PLT,  in the last 72 hours  Recent Labs  09/26/12 0440 09/27/12 0530  NA 139 136  K 4.4 4.2  CL 108 104  GLUCOSE 80 84  BUN 9 10  CREATININE 0.53 0.51  CALCIUM 7.9* 8.0*   CBG (last 3)   Recent Labs  09/26/12 1635 09/26/12 2046 09/27/12 0713  GLUCAP 121* 91 109*    Wt Readings from Last 3 Encounters:  09/27/12 75.7 kg (166 lb 14.2 oz)  09/08/12 97.977 kg (216 lb)  09/06/12 97.6 kg (215 lb 2.7 oz)    Physical Exam:  Constitutional: She is oriented to person, place, and time. She appears well-developed. Appears fatigued. obese  HENT: oral mucosa dry. Crusted/blood at corners of mouth. Dry blood on cover. Head: Normocephalic and atraumatic.  Eyes: Pupils are equal, round, and reactive to light.  Neck: Normal range of motion.  Cardiovascular: Normal rate and regular rhythm. No murmurs  Pulmonary/Chest: breathing comfortably. Still with Rales bilaterally Abdominal: Soft. Bowel sounds are normal. She exhibits no distension. There is no tenderness.  PEG in place without drainage.  Musculoskeletal: She exhibits persistent edema (BLE with trace ).  Foam dressing bilateral feet. - Neurological: She is alert and oriented to person, place, and time.    Cognitively with basic insight and awareness. Mentation improved. Motor: 4/5 in bilateral deltoid, biceps, triceps3+/5 in bilateral hip flexor knee extensor ankle dorsiflexor and  plantar flexor  Sensory to light touch is intact in the upper and lower limbs  Speech is clear. No language deficits Psych: pleasant, less anxious    Assessment/Plan: 1. Functional deficits secondary to deconditioning/FTT after breast cancer and associate complications  which require 3+ hours per day of interdisciplinary therapy in a comprehensive inpatient rehab setting. Physiatrist is providing close team supervision and 24 hour management of active medical problems listed below. Physiatrist and rehab team continue to assess barriers to discharge/monitor patient progress toward functional and medical goals.   Finalize dc planning for tomorrow   FIM: FIM - Bathing Bathing Steps Patient Completed: Chest;Right Arm;Left Arm;Abdomen (only elected to wash upper body; bathed last night?) Bathing: 5: Supervision: Safety issues/verbal cues  FIM - Upper Body Dressing/Undressing Upper body dressing/undressing steps patient completed:  (was already dressed and did not want to change) Upper body dressing/undressing: 5: Supervision: Safety issues/verbal cues FIM - Lower Body Dressing/Undressing Lower body dressing/undressing steps patient completed:  (did not want to change bottoms and stated, "I am still clean) Lower body dressing/undressing: 3: Mod-Patient completed 50-74% of tasks  FIM - Toileting Toileting steps completed by patient: Adjust clothing prior to toileting;Adjust clothing after toileting;Performs perineal hygiene Toileting Assistive Devices: Grab bar or rail for support Toileting: 5: Supervision: Safety issues/verbal cues  FIM - Diplomatic Services operational officer Devices: Grab bars Toilet Transfers: 5-To toilet/BSC: Supervision (verbal cues/safety issues);5-From toilet/BSC: Supervision (verbal cues/safety issues)  FIM - Press photographer Assistive Devices: Arm rests;Walker Bed/Chair Transfer: 7: Bed > Chair or W/C:  No assist;4: Chair or W/C >  Bed: Min A (steadying Pt. > 75%)  FIM - Locomotion: Wheelchair Distance: 80 Locomotion: Wheelchair: 1: Total Assistance/staff pushes wheelchair (Pt<25%) FIM - Locomotion: Ambulation Locomotion: Ambulation Assistive Devices: Designer, industrial/product Ambulation/Gait Assistance: 4: Min assist Locomotion: Ambulation: 1: Travels less than 50 ft with minimal assistance (Pt.>75%)  Comprehension Comprehension Mode: Auditory Comprehension: 6-Follows complex conversation/direction: With extra time/assistive device  Expression Expression Mode: Verbal Expression: 6-Expresses complex ideas: With extra time/assistive device  Social Interaction Social Interaction: 6-Interacts appropriately with others with medication or extra time (anti-anxiety, antidepressant).  Problem Solving Problem Solving: 6-Solves complex problems: With extra time  Memory Memory: 7-Complete Independence: No helper  Medical Problem List and Plan:  1. DVT Prophylaxis/Anticoagulation: On coumadin for atrial fibrillation.  2. Pain Management: Morphine prn  3. Depression: Team to provide ego support. To continue Wellbutrin, Lexapro and Zyprexa. Will have LCSW follow for support. neuropsych eval pending   -klonopin helpful  for anxiety 4. Neuropsych: This patient is capable of making decisions on her own behalf.  5. HTN: Monitor on bid basis. Lasix, lopressor 6. OSA: continue CPAP at nights.  7. Cirrhosis of liver-autoimmune?:  8. Atrial Fibrillation with RVR/CHF: Will continue to monitor HR on bid basis. Has been off Cardizem. Continue metoprolol. Amiodarone per cards  -lasix 40QD, aldactone  -foley out.  -follow up cxr stable  -appreciate cardiology assist  -proteint malnutrition---TEDS for edema control, elevation 9.Anemia: remains around 8  -continue to follow serially   10. DM type 2-diet controlled: Regular diet for now to help with food choices and appetite. Continue TF 6 X day and monitor intake.  11. Breast cancer:  On Femara. For XRT once stronger. Patient to follow up with Dr. Michell Heinrich aftert discharge 12. Diarrhea:  -labs ok   -needs scheduled imodium 13. FEN-   -TF as above  -MMW for sore tongue/throat 14. Klebsiella UTI- rx'ed  LOS (Days) 14 A FACE TO FACE EVALUATION WAS PERFORMED  Zayanna Pundt T 09/27/2012 8:12 AM

## 2012-09-27 NOTE — Progress Notes (Signed)
Pt has home CPAP machine in room. Pt is able to to place on and off as needed. No distress noted.

## 2012-09-28 ENCOUNTER — Telehealth: Payer: Self-pay | Admitting: *Deleted

## 2012-09-28 ENCOUNTER — Telehealth: Payer: Self-pay

## 2012-09-28 ENCOUNTER — Other Ambulatory Visit: Payer: Self-pay | Admitting: Oncology

## 2012-09-28 DIAGNOSIS — C50911 Malignant neoplasm of unspecified site of right female breast: Secondary | ICD-10-CM

## 2012-09-28 LAB — PROTIME-INR
INR: 2.06 — ABNORMAL HIGH (ref 0.00–1.49)
Prothrombin Time: 22.4 seconds — ABNORMAL HIGH (ref 11.6–15.2)

## 2012-09-28 MED ORDER — OLANZAPINE 2.5 MG PO TABS: 2.5000 mg | ORAL_TABLET | Freq: Every day | ORAL | Status: DC

## 2012-09-28 MED ORDER — MAGIC MOUTHWASH W/LIDOCAINE: 5.0000 mL | Freq: Three times a day (TID) | ORAL | Status: DC

## 2012-09-28 MED ORDER — POTASSIUM CHLORIDE ER 10 MEQ PO TBCR: 20.0000 meq | EXTENDED_RELEASE_TABLET | Freq: Every day | ORAL | Status: DC

## 2012-09-28 MED ORDER — WARFARIN SODIUM 3 MG PO TABS: ORAL_TABLET | ORAL | Status: DC

## 2012-09-28 MED ORDER — OSMOLITE 1.5 CAL PO LIQD: 237.0000 mL | Freq: Two times a day (BID) | ORAL | Status: DC

## 2012-09-28 MED ORDER — CHOLESTYRAMINE 4 G PO PACK: 4.0000 g | PACK | Freq: Every day | ORAL | Status: DC | PRN

## 2012-09-28 MED ORDER — CLONAZEPAM 0.5 MG PO TABS: 0.2500 mg | ORAL_TABLET | Freq: Two times a day (BID) | ORAL | Status: DC

## 2012-09-28 MED ORDER — LOPERAMIDE HCL 2 MG PO CAPS: 2.0000 mg | ORAL_CAPSULE | Freq: Three times a day (TID) | ORAL | Status: DC

## 2012-09-28 MED ORDER — FUROSEMIDE 80 MG PO TABS: 40.0000 mg | ORAL_TABLET | Freq: Every day | ORAL | Status: DC

## 2012-09-28 MED ORDER — WARFARIN SODIUM 3 MG PO TABS
3.0000 mg | ORAL_TABLET | Freq: Once | ORAL | Status: DC
  Filled 2012-09-28: qty 1

## 2012-09-28 MED ORDER — METOPROLOL TARTRATE 25 MG PO TABS: 25.0000 mg | ORAL_TABLET | Freq: Two times a day (BID) | ORAL | Status: DC

## 2012-09-28 MED ORDER — SPIRONOLACTONE 25 MG PO TABS: 25.0000 mg | ORAL_TABLET | Freq: Every day | ORAL | Status: DC

## 2012-09-28 MED ORDER — HEPARIN SOD (PORK) LOCK FLUSH 100 UNIT/ML IV SOLN
500.0000 [IU] | INTRAVENOUS | Status: AC | PRN
  Administered 2012-09-28: 500 [IU]

## 2012-09-28 NOTE — Progress Notes (Signed)
Subjective/Complaints: Excited to go home. She feels like she's ready.  A 12 point review of systems has been performed and if not noted above is otherwise negative.   Objective: Vital Signs: Blood pressure 119/65, pulse 76, temperature 97.6 F (36.4 C), temperature source Oral, resp. rate 20, height 5' 4.17" (1.63 m), weight 74.3 kg (163 lb 12.8 oz), SpO2 100.00%. No results found.  Recent Labs  09/27/12 0840  WBC 8.8  HGB 8.9*  HCT 28.2*  PLT 353    Recent Labs  09/26/12 0440 09/27/12 0530  NA 139 136  K 4.4 4.2  CL 108 104  GLUCOSE 80 84  BUN 9 10  CREATININE 0.53 0.51  CALCIUM 7.9* 8.0*   CBG (last 3)   Recent Labs  09/27/12 1123 09/27/12 1637 09/27/12 2136  GLUCAP 74 79 75    Wt Readings from Last 3 Encounters:  09/28/12 74.3 kg (163 lb 12.8 oz)  09/08/12 97.977 kg (216 lb)  09/06/12 97.6 kg (215 lb 2.7 oz)    Physical Exam:  Constitutional: She is oriented to person, place, and time. She appears well-developed. Appears fatigued. obese  HENT: oral mucosa dry. Crusted/blood at corners of mouth. Dry blood on cover. Head: Normocephalic and atraumatic.  Eyes: Pupils are equal, round, and reactive to light.  Neck: Normal range of motion.  Cardiovascular: Normal rate and regular rhythm. No murmurs  Pulmonary/Chest: breathing comfortably. Still with Rales bilaterally Abdominal: Soft. Bowel sounds are normal. She exhibits no distension. There is no tenderness.  PEG in place without drainage.  Musculoskeletal: She exhibits persistent edema (BLE with trace ).  Foam dressing bilateral feet. - Neurological: She is alert and oriented to person, place, and time.    Cognitively with basic insight and awareness. Mentation improved. Motor: 4/5 in bilateral deltoid, biceps, triceps3+/5 in bilateral hip flexor knee extensor ankle dorsiflexor and plantar flexor  Sensory to light touch is intact in the upper and lower limbs  Speech is clear. No language  deficits Psych: pleasant, less anxious    Assessment/Plan: 1. Functional deficits secondary to deconditioning/FTT after breast cancer and associate complications  which require 3+ hours per day of interdisciplinary therapy in a comprehensive inpatient rehab setting. Physiatrist is providing close team supervision and 24 hour management of active medical problems listed below. Physiatrist and rehab team continue to assess barriers to discharge/monitor patient progress toward functional and medical goals.   DC today. Pt very appreciate of the efforts of entire team. She has made great progress.   FIM: FIM - Bathing Bathing Steps Patient Completed: Chest;Right Arm;Left Arm;Abdomen;Front perineal area;Buttocks;Right upper leg;Left upper leg;Right lower leg (including foot);Left lower leg (including foot) Bathing: 5: Supervision: Safety issues/verbal cues  FIM - Upper Body Dressing/Undressing Upper body dressing/undressing steps patient completed: Thread/unthread right sleeve of pullover shirt/dresss;Thread/unthread left sleeve of pullover shirt/dress;Put head through opening of pull over shirt/dress;Pull shirt over trunk Upper body dressing/undressing: 5: Supervision: Safety issues/verbal cues FIM - Lower Body Dressing/Undressing Lower body dressing/undressing steps patient completed: Thread/unthread right pants leg;Thread/unthread left pants leg;Pull pants up/down;Don/Doff right sock;Don/Doff left sock Lower body dressing/undressing: 5: Supervision: Safety issues/verbal cues  FIM - Toileting Toileting steps completed by patient: Adjust clothing prior to toileting;Adjust clothing after toileting Toileting Assistive Devices: Grab bar or rail for support Toileting: 5: Supervision: Safety issues/verbal cues  FIM - Diplomatic Services operational officer Devices: Grab bars;Walker Toilet Transfers: 5-To toilet/BSC: Supervision (verbal cues/safety issues)  FIM - Physiological scientist Devices: Therapist, occupational:  3: Supine > Sit: Mod A (lifting assist/Pt. 50-74%/lift 2 legs;4: Sit > Supine: Min A (steadying pt. > 75%/lift 1 leg);4: Bed > Chair or W/C: Min A (steadying Pt. > 75%);4: Chair or W/C > Bed: Min A (steadying Pt. > 75%)  FIM - Locomotion: Wheelchair Distance: 80 Locomotion: Wheelchair: 1: Total Assistance/staff pushes wheelchair (Pt<25%) FIM - Locomotion: Ambulation Locomotion: Ambulation Assistive Devices: Designer, industrial/product Ambulation/Gait Assistance: 5: Supervision Locomotion: Ambulation: 5: Travels 150 ft or more with supervision/safety issues  Comprehension Comprehension Mode: Auditory Comprehension: 6-Follows complex conversation/direction: With extra time/assistive device  Expression Expression Mode: Verbal Expression: 5-Expresses basic needs/ideas: With no assist  Social Interaction Social Interaction: 5-Interacts appropriately 90% of the time - Needs monitoring or encouragement for participation or interaction.  Problem Solving Problem Solving: 5-Solves basic 90% of the time/requires cueing < 10% of the time  Memory Memory: 6-More than reasonable amt of time  Medical Problem List and Plan:  1. DVT Prophylaxis/Anticoagulation: On coumadin for atrial fibrillation.  2. Pain Management: Morphine prn  3. Depression: Team to provide ego support. To continue Wellbutrin, Lexapro and Zyprexa. Will have LCSW follow for support. neuropsych eval pending   -klonopin helpful  for anxiety 4. Neuropsych: This patient is capable of making decisions on her own behalf.  5. HTN: Monitor on bid basis. Lasix, lopressor 6. OSA: continue CPAP at nights.  7. Cirrhosis of liver-autoimmune?:  8. Atrial Fibrillation with RVR/CHF: Will continue to monitor HR on bid basis. Has been off Cardizem. Continue metoprolol. Amiodarone per cards  -lasix 40QD, aldactone  -foley out.  -follow up cxr stable  -appreciate cardiology  assist  -proteint malnutrition---TEDS for edema control, elevation 9.Anemia: remains around 8-9  -continue to follow serially as outpt   10. DM type 2-diet controlled: Regular diet for now to help with food choices and appetite. Continue TF 6 X day and monitor intake.  11. Breast cancer: On Femara. For XRT once stronger. Patient to follow up with Dr. Michell Heinrich aftert discharge 12. Diarrhea:  -labs ok   -needs scheduled imodium 13. FEN-   -TF as above  -MMW for sore tongue/throat 14. Klebsiella UTI- rx'ed  LOS (Days) 15 A FACE TO FACE EVALUATION WAS PERFORMED  SWARTZ,ZACHARY T 09/28/2012 7:42 AM

## 2012-09-28 NOTE — Telephone Encounter (Signed)
sw pt husband gv appt for 10/29/12 ov @ 10am and lab@ 10:30 due to [facility] wants to see her 1st. i also made pt aware that i will mail a letter/cal..td

## 2012-09-28 NOTE — Discharge Summary (Signed)
Physician Discharge Summary  Patient ID: [name] MRN: [id number] DOB/AGE: 1955/07/22 57 y.o.  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  Significant Diagnostic Studies: Dg Chest 2 View  09/22/2012   *RADIOLOGY REPORT*  Clinical Data: CHF follow-up  CHEST - 2 VIEW  Comparison: 09/19/2012  Findings: Left chest wall porta-catheter is noted with tip in the SVC.  Mild cardiac enlargement noted.  Bilateral, multifocal airspace opacities persist.  This is not significantly improved from previous exam.  IMPRESSION:  1.  No significant change in aeration to the lungs compared with previous exam.   Original Report Authenticated By: Signa Kell, M.D.   Dg Chest 2 View  09/17/2012   *RADIOLOGY REPORT*  Clinical Data: Congestive heart failure.  Shortness breath. Weakness.  Cirrhosis.  Breast carcinoma.  CHEST - 2 VIEW  Comparison: 09/16/2012  Findings: Bilateral pulmonary air space disease shows no significant interval change.  Cardiomegaly remains stable. Left- sided Port-A-Cath remains in appropriate position.  IMPRESSION: Stable bilateral pulmonary air space disease and cardiomegaly.   Original Report Authenticated By: Myles Rosenthal, M.D.   Dg Chest 2 View  09/16/2012   *RADIOLOGY REPORT*  Clinical Data: Shortness of breath.  Prior history of CHF.  CHEST - 2 VIEW  Comparison: Portable chest x-rays 08/26/2012, 08/25/2012.  Two-view chest x-ray 08/24/2012, 08/03/2012, 11/07/2008.  Findings: Suboptimal inspiration accounts for crowded bronchovascular markings, especially in the lung bases,  and accentuates the cardiac silhouette.  Taking this into account, cardiac silhouette moderately enlarged but stable.  Severe diffuse interstitial and airspace pulmonary edema. Moderate sized bilateral pleural effusions.  Left subclavian Port-A-Cath tip remains in the SVC.  IMPRESSION: Severe CHF and/or fluid overload, with severe interstitial and airspace pulmonary edema and moderate sized bilateral pleural effusions.   Original Report Authenticated By: Hulan Saas, M.D.    Dg Chest Port 1 View  09/19/2012   *RADIOLOGY REPORT*  Clinical Data: Pneumonia and history of breast carcinoma.  PORTABLE CHEST - 1 VIEW  Comparison: 09/17/2012  Findings: Lung expansion has improved bilaterally with residual prominent infiltrates bilaterally.  Density of infiltrates in the upper lung zones appear slightly improved.  No significant pleural effusions are identified.  Heart size is stable.  There is stable positioning of a Port-A-Cath.  IMPRESSION: Improved expansion of both lungs with decreased density of upper zone infiltrates.   Original Report Authenticated By: Irish Lack, M.D.    Labs:  Basic Metabolic Panel:  Recent Labs Lab 09/22/12 0545 09/23/12 0600 09/24/12 0500 09/25/12 0500 09/26/12 0440 09/27/12 0530  NA 141 139 137 138 139 136  K 4.3 4.4 4.4 4.2 4.4 4.2  CL 107 109 108 109 108 104  CO2 26 24 23 23 25 26   GLUCOSE 66* 68* 62* 61* 80 84  BUN 11 11 9 9 9 10   CREATININE 0.55 0.56 0.52 0.57 0.53 0.51  CALCIUM 7.5* 7.8* 7.7* 7.7* 7.9* 8.0*    CBC:  Recent Labs Lab 09/27/12 0840  WBC 8.8  HGB 8.9*  HCT 28.2*  MCV 87.9  PLT 353  CBG:  Recent Labs Lab 09/26/12 2046 09/27/12 0713 09/27/12 1123 09/27/12 1637 09/27/12 2136  GLUCAP 91 109* 74 79 75    Brief HPI:   [name] is a 57 y.o. female with history of breast cancer (diagnosed 01/14) with intolerance of chemo, Atrial fibrillation, malnutrition requiring PEG 08/27/12 during recent admission,;who was  discharged to home 09/06/12. Family reported difficulty getting patient out of the car and into the home due to inability walk and BLE weakness. She was readmitted on 04/29 with severely depressed mood, complaints of weakness and refusal to continue any further interventions. Palliative care and psychiatary consulted for assistance and patient was agreeable to start medications to help with mood stabilization as well as continued medical interventions. She was started on Zyprexa to help with insomnia and lexapro dose increased to help with mood stabilization. XRT to be initiated in the future. Po intake remains poor but she is tolerating TF without recurrent diarrhea. Therapies initiated and CIR recommended   Hospital Course: [name] was admitted to rehab 09/13/2012 for inpatient therapies to consist of PT, ST and OT at least three hours five days a week. Past admission physiatrist, therapy team and rehab RN have worked together to provide customized collaborative inpatient rehab. Blood pressure has been stable and heart rate controlled. She was noted to be dyspneic due to fluid overload and required IV diuresis. Dr. [place] Lions was consulted for input and has assisted in management of fluid overload. Her amiodarone was discontinued as she was NSR and  due to elevated TSH.  She was placed on 1000 cc po fluid restrictions. Fluid overload has resolved with resolution of dyspnea. Weight at time of discharge is at 74.3 kg. Serial check of H/H showed downward trend initially. Stool guaiacs X 2 were negative and recent CBC shows H/H back to baseline. Urine culture done past admission revealed Kleb UTI and was treated with cipro. Foley was discontinued on 05/16 and patient has been voiding without signs of retention. Follow up urine culture showed insignificant growth.   Dr. Wylene Simmer has been following patient due to her depressive symptoms.  Close follow up with Psych recommended due to her recent severe depressive  symptoms. She set to follow up with Dr. Noe Gens on 05/27. She was noted to have high levels of anxiety and was started on klonopin bid. She has been fearful of eating due to concerns of GI symptoms as well as taste disturbance. She continues on TF qid and has been advised to attempts po's prior to supplement. She is to follow up with Dr. Fredia Sorrow for any problems with PEG tube.    Rehab course: During patient's stay in rehab weekly team conferences were held to monitor patient's progress, set goals and discuss barriers to discharge. Occupational therapy has worked with patient on ADL tasks.  Patient requires supervision for bathing and dressing tasks. Husband has been supportive and has been educated on providing assistance past discharge.  Physical therapy has focused on balance, strengthening, endurance and mobility. Patient is independent  for transfers. She is able to ambulate 240 feet with supervision and use of rolling walker. She will continue with HHPT/OT past discharge. HHRN to draw protime on 05/22 with results to [facility].   Disposition: Home.    Diet: Low salt diet. Limit fluid by mouth to 800-1000cc daily. Osmolite 360 cc after supper and at bedtime. 237 cc after breakfast and lunch.   Special Instructions: 1. Weight yourself daily.  2. Try to  eat food by mouth first prior to tube feed.  3. May use phenergan 12.5 to 25 mg four times a day as needed for nausea.    Future Appointments Provider Department Dept Phone   10/06/2012 2:45 PM Sandford Craze, NP [facility] at  [name] [phone]   10/25/2012 12:00 PM Ranelle Oyster, MD [facility] Rehabilitation [phone]   10/29/2012 8:30 AM [facility] Nurse [facility] [phone]   10/29/2012 9:00 AM Lurline Hare, MD [facility] [phone]       Medication List    STOP taking these medications        amiodarone 200 MG tablet  Commonly known as:  PACERONE     aspirin EC 81 MG tablet     atorvastatin 10 MG tablet  Commonly known as:  LIPITOR     feeding supplement Liqd     insulin aspart 100 UNIT/ML injection  Commonly known as:  novoLOG     megestrol 400 MG/10ML suspension  Commonly known as:  MEGACE     omeprazole 20 MG capsule  Commonly known as:  PRILOSEC     oxyCODONE-acetaminophen 5-325 MG per tablet  Commonly known as:  PERCOCET/ROXICET      TAKE these medications       antiseptic oral rinse Liqd  15 mLs by Mouth Rinse route 2 (two) times daily as needed.     buPROPion 150 MG 12 hr tablet  Commonly known as:  WELLBUTRIN SR  Take 150 mg by mouth every morning.     cholestyramine 4 G packet  Commonly known as:  QUESTRAN  Take 1 packet (4 g total) by mouth daily as needed. For diarrhea.     clonazePAM 0.5 MG tablet  Commonly known as:  KLONOPIN  Take 0.5 tablets (0.25 mg total) by mouth 2 (two) times daily. For anxiety     escitalopram 10 MG tablet  Commonly known as:  LEXAPRO  Take 1 tablet (10 mg total) by mouth at bedtime.     feeding supplement Liqd  Take 1 Container by mouth 2 (two) times daily between meals.     feeding supplement (OSMOLITE 1.5 CAL) Liqd  Place 237 mLs into feeding tube 2 (two) times daily after a meal. Breakfast and lunch. Place 350 cc in feeding tube after supper and at bedtime.      free water Soln  Place 80-100 mLs into feeding tube 4 (four) times daily - after meals and at bedtime.     furosemide 80 MG tablet  Commonly known as:  LASIX  Take 0.5 tablets (40 mg total) by mouth daily.     letrozole 2.5 MG tablet  Commonly known as:  FEMARA  Take 1 tablet (2.5 mg total) by mouth daily.     levothyroxine 125 MCG tablet  Commonly known as:  SYNTHROID, LEVOTHROID  Take 125 mcg by mouth daily before breakfast.     lidocaine-prilocaine cream  Commonly known as:  EMLA  Apply 1 application topically as needed (for port  access).     loperamide 2 MG capsule  Commonly known as:  IMODIUM  Take 1 capsule (2 mg total) by mouth 3 (three) times daily before meals.     magic mouthwash w/lidocaine Soln  Take 5 mLs by mouth 3 (three) times daily.     metoprolol tartrate 25 MG tablet  Commonly known as:  LOPRESSOR  Take 1 tablet (25 mg total) by  mouth 2 (two) times daily.     multivitamin with minerals Tabs  Take 1 tablet by mouth daily.     OLANZapine 2.5 MG tablet  Commonly known as:  ZYPREXA  Take 1 tablet (2.5 mg total) by mouth at bedtime. To help with sleep/anxiety.     ondansetron 4 MG disintegrating tablet  Commonly known as:  ZOFRAN ODT  Take 1 tablet (4 mg total) by mouth every 8 (eight) hours as needed for nausea.     potassium chloride 10 MEQ tablet  Commonly known as:  K-DUR  Take 2 tablets (20 mEq total) by mouth twice a day.     saccharomyces boulardii 250 MG capsule  Commonly known as:  FLORASTOR  Take 1 capsule (250 mg total) by mouth 2 (two) times daily.     sodium chloride 0.9 % injection  10-40 mLs by Intracatheter route as needed (flush).     spironolactone 25 MG tablet  Commonly known as:  ALDACTONE  Take 1 tablet (25 mg total) by mouth daily. diuretic     warfarin 3 MG tablet  Commonly known as:  COUMADIN  Take one pill with supper daily           Follow-up Information   Follow up with Ranelle Oyster, MD On 10/25/2012. (Be there a 11:30 for 12 noon appointment)    Contact information:   [address], [address] [place] [address] [phone]       Schedule an appointment as soon as possible for a visit with Lowella Dell, MD.   Contact information:   [address] [place] [address] [phone]       Follow up with Rollene Rotunda, MD. Call today. (for follow up in 2 weeks. )    Contact information:   [address] [place] [address] [phone]       Follow up with [name]., NP  On 10/06/2012. (Be there @ 2:45 pm for post hospital follow up)    Contact information:   [address] [place] [address] [phone]       Follow up with Lurline Hare, MD On 10/29/2012. (for evaluation)    Contact information:   [address] [place] [address] [phone]       Follow up with PETERS,JEANNE E, PsyD On 10/05/2012. (APPT: 12:45   )    Contact information:   [address] [place] [address] [phone]       Signed: Jacquelynn Cree 09/28/2012, 11:46 AM

## 2012-09-28 NOTE — Telephone Encounter (Signed)
Pt being discharged from [facility] today 09/28/12 INR 2.06.  Pt of Dr Hochrein's for afib discharged on 3mg  daily.  [facility] to draw INR on Thursday 09/30/12 and call results to Coumadin clinic.

## 2012-09-28 NOTE — Progress Notes (Signed)
Social Work Discharge Note Discharge Note  The overall goal for the admission was met for:   Discharge location: Yes-HOME WITH HUSBAND  Length of Stay: Yes-15 DAYS  Discharge activity level: Yes-SUPERVISION/ MOD/I LEVEL  Home/community participation: Yes  Services provided included: MD, RD, PT, OT, RN, TR, Pharmacy, Neuropsych and SW  Financial Services: Private Insurance: [employer]  Follow-up services arranged: Home Health: [facility]-PT,OT,RN, DME: [facility]-HOSPTIAL BED, [facility] and Patient/Family has no preference for HH/DME agencies  Comments (or additional information):APPT FOR FOLLOW UP COUNSELING-DR JEANNE PETERS 5/27 12:45   Patient/Family verbalized understanding of follow-up arrangements: Yes  Individual responsible for coordination of the follow-up plan: SELF & [name]-HUSBAND  Confirmed correct DME delivered: Lucy Chris 09/28/2012    Lucy Chris

## 2012-09-28 NOTE — Progress Notes (Signed)
Patient discharged to home at 1127 with husband . Discharge instructions given and reviewed by P. Love, PA. Left chest porta cath intact  IV therapy deaccessed prior to discharge. Husband and patient verbalized understanding of discharge instructions .                              [name]

## 2012-09-28 NOTE — Progress Notes (Signed)
ANTICOAGULATION CONSULT NOTE - Follow Up Consult  Pharmacy Consult for coumadin Indication: atrial fibrillation  Allergies  Allergen Reactions  . Olmesartan Medoxomil Cough    REACTION: ? if cough  . Tetracycline Hcl     Unknown reaction, too long for patient to remember   . Venlafaxine     REACTION: severe dry moouth  . Adhesive (Tape) Rash    Patient Measurements: Height: 5' 4.17" (163 cm) Weight: 163 lb 12.8 oz (74.3 kg) IBW/kg (Calculated) : 55.1 Heparin Dosing Weight:   Vital Signs: Temp: 97.6 F (36.4 C) (05/20 0500) Temp src: Oral (05/20 0500) BP: 119/65 mmHg (05/20 0500) Pulse Rate: 76 (05/20 0500)  Labs:  Recent Labs  09/26/12 0440 09/27/12 0530 09/27/12 0840 09/28/12 0500  HGB  --   --  8.9*  --   HCT  --   --  28.2*  --   PLT  --   --  353  --   LABPROT 25.2* 24.1*  --  22.4*  INR 2.42* 2.28*  --  2.06*  CREATININE 0.53 0.51  --   --     Estimated Creatinine Clearance: 76 ml/min (by C-G formula based on Cr of 0.51).   Medications:  Scheduled:  . antiseptic oral rinse  15 mL Mouth Rinse BID  . buPROPion  150 mg Oral q morning - 10a  . clonazePAM  0.25 mg Oral BID  . escitalopram  20 mg Oral QHS  . feeding supplement (OSMOLITE 1.5 CAL)  237 mL Per Tube Custom  . feeding supplement (OSMOLITE 1.5 CAL)  360 mL Per Tube BID WC  . furosemide  40 mg Oral Daily  . letrozole  2.5 mg Oral Daily  . levothyroxine  125 mcg Oral QAC breakfast  . loperamide  2 mg Oral TID  . magic mouthwash w/lidocaine  5 mL Oral TID  . metoprolol tartrate  25 mg Oral BID  . multivitamin with minerals  1 tablet Oral Daily  . OLANZapine  2.5 mg Oral QHS  . potassium chloride  20 mEq Oral BID  . saccharomyces boulardii  250 mg Oral BID  . spironolactone  25 mg Oral Daily  . warfarin  2.5 mg Oral q1800  . Warfarin - Pharmacist Dosing Inpatient   Does not apply q1800   Infusions:    Assessment: 57 yo female with afib is currently on therapeutic coumadin.  INR, however,  is down to 2.06. Goal of Therapy:  INR 2-3    Plan:  1) d/c standing order of coumadin 2.5mg  po qday.  Give coumadin 3mg  po x1 tonight 2) INR in am  Lakota Markgraf, Tsz-Yin 09/28/2012,8:26 AM

## 2012-10-01 ENCOUNTER — Ambulatory Visit (INDEPENDENT_AMBULATORY_CARE_PROVIDER_SITE_OTHER): Payer: 59 | Admitting: Cardiology

## 2012-10-01 DIAGNOSIS — Z86718 Personal history of other venous thrombosis and embolism: Secondary | ICD-10-CM

## 2012-10-01 DIAGNOSIS — I4891 Unspecified atrial fibrillation: Secondary | ICD-10-CM

## 2012-10-01 LAB — POCT INR: INR: 1.8

## 2012-10-05 ENCOUNTER — Ambulatory Visit: Payer: 59 | Admitting: Family

## 2012-10-06 ENCOUNTER — Ambulatory Visit: Payer: 59 | Admitting: Family

## 2012-10-07 ENCOUNTER — Telehealth: Payer: Self-pay | Admitting: Cardiology

## 2012-10-07 ENCOUNTER — Ambulatory Visit (INDEPENDENT_AMBULATORY_CARE_PROVIDER_SITE_OTHER): Payer: 59 | Admitting: Psychiatry

## 2012-10-07 ENCOUNTER — Ambulatory Visit (INDEPENDENT_AMBULATORY_CARE_PROVIDER_SITE_OTHER): Payer: 59 | Admitting: Internal Medicine

## 2012-10-07 DIAGNOSIS — Z86718 Personal history of other venous thrombosis and embolism: Secondary | ICD-10-CM

## 2012-10-07 DIAGNOSIS — I4891 Unspecified atrial fibrillation: Secondary | ICD-10-CM

## 2012-10-07 DIAGNOSIS — F331 Major depressive disorder, recurrent, moderate: Secondary | ICD-10-CM

## 2012-10-07 DIAGNOSIS — F0630 Mood disorder due to known physiological condition, unspecified: Secondary | ICD-10-CM

## 2012-10-07 LAB — POCT INR: INR: 1.7

## 2012-10-07 NOTE — Telephone Encounter (Signed)
New Prob     Calling to report results:  20.1 PT  1.7 INR

## 2012-10-14 ENCOUNTER — Ambulatory Visit (INDEPENDENT_AMBULATORY_CARE_PROVIDER_SITE_OTHER): Payer: 59 | Admitting: Cardiology

## 2012-10-14 DIAGNOSIS — I4891 Unspecified atrial fibrillation: Secondary | ICD-10-CM

## 2012-10-14 DIAGNOSIS — Z86718 Personal history of other venous thrombosis and embolism: Secondary | ICD-10-CM

## 2012-10-14 LAB — POCT INR: INR: 1.7

## 2012-10-14 NOTE — Telephone Encounter (Signed)
Results noted. See anticoagulation note of this date.

## 2012-10-14 NOTE — Telephone Encounter (Signed)
New Prob  Reporting results-  PT 20.6 INR 1.7   Will be discharging pt today and will have to get it checked by [name] for now on.

## 2012-10-15 ENCOUNTER — Encounter: Payer: Self-pay | Admitting: Family

## 2012-10-15 ENCOUNTER — Ambulatory Visit (INDEPENDENT_AMBULATORY_CARE_PROVIDER_SITE_OTHER): Payer: PRIVATE HEALTH INSURANCE | Admitting: Family

## 2012-10-15 ENCOUNTER — Ambulatory Visit (INDEPENDENT_AMBULATORY_CARE_PROVIDER_SITE_OTHER): Payer: 59 | Admitting: Nurse Practitioner

## 2012-10-15 ENCOUNTER — Encounter: Payer: Self-pay | Admitting: Nurse Practitioner

## 2012-10-15 VITALS — BP 108/66 | HR 72 | Ht 64.0 in | Wt 155.1 lb

## 2012-10-15 VITALS — BP 110/76 | HR 75 | Temp 98.1°F | Resp 16 | Wt 156.0 lb

## 2012-10-15 DIAGNOSIS — K7460 Unspecified cirrhosis of liver: Secondary | ICD-10-CM

## 2012-10-15 DIAGNOSIS — Z79899 Other long term (current) drug therapy: Secondary | ICD-10-CM

## 2012-10-15 DIAGNOSIS — K769 Liver disease, unspecified: Secondary | ICD-10-CM

## 2012-10-15 DIAGNOSIS — I4891 Unspecified atrial fibrillation: Secondary | ICD-10-CM

## 2012-10-15 DIAGNOSIS — E43 Unspecified severe protein-calorie malnutrition: Secondary | ICD-10-CM

## 2012-10-15 DIAGNOSIS — E41 Nutritional marasmus: Secondary | ICD-10-CM

## 2012-10-15 DIAGNOSIS — C50911 Malignant neoplasm of unspecified site of right female breast: Secondary | ICD-10-CM

## 2012-10-15 DIAGNOSIS — F329 Major depressive disorder, single episode, unspecified: Secondary | ICD-10-CM

## 2012-10-15 DIAGNOSIS — C50919 Malignant neoplasm of unspecified site of unspecified female breast: Secondary | ICD-10-CM

## 2012-10-15 LAB — BASIC METABOLIC PANEL
BUN: 12 mg/dL (ref 6–23)
CO2: 28 mEq/L (ref 19–32)
Calcium: 8.8 mg/dL (ref 8.4–10.5)
Chloride: 103 mEq/L (ref 96–112)
Creatinine, Ser: 0.6 mg/dL (ref 0.4–1.2)
GFR: 120.4 mL/min (ref >60.00)
Glucose, Bld: 68 mg/dL — ABNORMAL LOW (ref 70–99)
Potassium: 4.1 mEq/L (ref 3.5–5.1)
Sodium: 136 mEq/L (ref 135–145)

## 2012-10-15 LAB — CBC WITH DIFFERENTIAL/PLATELET
Basophils Absolute: 0 10*3/uL (ref 0.0–0.1)
Basophils Relative: 0.3 % (ref 0.0–3.0)
Eosinophils Absolute: 0 10*3/uL (ref 0.0–0.7)
Eosinophils Relative: 0.4 % (ref 0.0–5.0)
HCT: 32.8 % — ABNORMAL LOW (ref 36.0–46.0)
Hemoglobin: 10.9 g/dL — ABNORMAL LOW (ref 12.0–15.0)
Lymphocytes Relative: 14.8 % (ref 12.0–46.0)
Lymphs Abs: 1.2 10*3/uL (ref 0.7–4.0)
MCHC: 33.1 g/dL (ref 30.0–36.0)
MCV: 88.4 fl (ref 78.0–100.0)
Monocytes Absolute: 0.7 10*3/uL (ref 0.1–1.0)
Monocytes Relative: 8 % (ref 3.0–12.0)
Neutro Abs: 6.2 10*3/uL (ref 1.4–7.7)
Neutrophils Relative %: 76.5 % (ref 43.0–77.0)
Platelets: 82 10*3/uL — ABNORMAL LOW (ref 150.0–400.0)
RBC: 3.71 Mil/uL — ABNORMAL LOW (ref 3.87–5.11)
RDW: 21.4 % — ABNORMAL HIGH (ref 11.5–14.6)
WBC: 8.1 10*3/uL (ref 4.5–10.5)

## 2012-10-15 LAB — HEPATIC FUNCTION PANEL
ALT: 39 U/L — ABNORMAL HIGH (ref 0–35)
AST: 64 U/L — ABNORMAL HIGH (ref 0–37)
Albumin: 3 g/dL — ABNORMAL LOW (ref 3.5–5.2)
Alkaline Phosphatase: 149 U/L — ABNORMAL HIGH (ref 39–117)
Bilirubin, Direct: 0 mg/dL (ref 0.0–0.3)
Total Bilirubin: 0.6 mg/dL (ref 0.3–1.2)
Total Protein: 7.4 g/dL (ref 6.0–8.3)

## 2012-10-15 LAB — TSH: TSH: 3.01 u[IU]/mL (ref 0.35–5.50)

## 2012-10-15 NOTE — Patient Instructions (Addendum)
Please follow up in 2 months, sooner if problems/concerns.

## 2012-10-15 NOTE — Progress Notes (Addendum)
[name] Date of Birth: 1955-05-18 Medical Record #[id number]  History of Present Illness: [name] is seen back today for a post hospital visit. Seen for Dr. Elease [name]. Has HTN, chronic leg edema, prior DVT on coumadin in the past, hypothyroidism, DM, OSA, GERD, chronic liver disease with cirrhosis, breast cancer - intolerant of chemotherapy, atrial fib and diastolic heart failure.  Most recently had a very lengthy hospitalization for failure to thrive. Had breast cancer diagnosed in January. Did not tolerate her chemo. Has had progressive weakness. Has a PEG in place for nutrition. Has had issues with severely depressed mood and required psyche evaluation. Was on amiodarone for her AF but this was stopped. TSH was quite elevated. Had evidence of volume overload and was gently diuresed and required fluid restriction. Felt to have diastolic heart failure. She is on coumadin.   Comes in today. She is here with her husband. She is doing better. She feels better. No chest pain. No palpitations. Rhythm has been ok. Breathing is ok. Weight is stable. Still has her PEG in place. Still with significant altered taste. Would like to get her coumadin checked thru her PCP due to cost. Sees oncology later this month and radiation oncology.   Current Outpatient Prescriptions on File Prior to Visit  Medication Sig Dispense Refill  . antiseptic oral rinse (BIOTENE) LIQD 15 mLs by Mouth Rinse route 2 (two) times daily as needed.  100 mL  3  . buPROPion (WELLBUTRIN SR) 150 MG 12 hr tablet Take 150 mg by mouth every morning.       . cholestyramine (QUESTRAN) 4 G packet Take 1 packet (4 g total) by mouth daily as needed. For diarrhea.  60 each  3  . clonazePAM (KLONOPIN) 0.5 MG tablet Take 0.5 tablets (0.25 mg total) by mouth 2 (two) times daily. For anxiety  60 tablet  1  . escitalopram (LEXAPRO) 10 MG tablet Take 1 tablet (10 mg total) by mouth at bedtime.  31 tablet  0  . furosemide (LASIX) 80 MG tablet  Take 0.5 tablets (40 mg total) by mouth daily.  62 tablet  0  . letrozole (FEMARA) 2.5 MG tablet Take 1 tablet (2.5 mg total) by mouth daily.  30 tablet  3  . levothyroxine (SYNTHROID, LEVOTHROID) 125 MCG tablet Take 125 mcg by mouth daily before breakfast.      . lidocaine-prilocaine (EMLA) cream Apply 1 application topically as needed (for port access).      [name] loperamide (IMODIUM) 2 MG capsule Take 1 capsule (2 mg total) by mouth 3 (three) times daily before meals.  30 capsule  0  . metoprolol tartrate (LOPRESSOR) 25 MG tablet Take 1 tablet (25 mg total) by mouth 2 (two) times daily.  180 tablet  0  . Nutritional Supplements (FEEDING SUPPLEMENT, OSMOLITE 1.5 CAL,) LIQD Place 237 mLs into feeding tube 2 (two) times daily after a meal. Breakfast and lunch.    0  . OLANZapine (ZYPREXA) 2.5 MG tablet Take 1 tablet (2.5 mg total) by mouth at bedtime. To help with sleep/anxiety.  30 tablet  1  . ondansetron (ZOFRAN ODT) 4 MG disintegrating tablet Take 1 tablet (4 mg total) by mouth every 8 (eight) hours as needed for nausea.  20 tablet  0  . potassium chloride (K-DUR) 10 MEQ tablet Take 2 tablets (20 mEq total) by mouth daily.  120 tablet  1  . saccharomyces boulardii (FLORASTOR) 250 MG capsule Take 1 capsule (250 mg total) by  mouth 2 (two) times daily.  60 capsule  1  . sodium chloride 0.9 % injection 10-40 mLs by Intracatheter route as needed (flush).  5 mL  3  . spironolactone (ALDACTONE) 25 MG tablet Take 1 tablet (25 mg total) by mouth daily. diuretic  30 tablet  1  . warfarin (COUMADIN) 3 MG tablet Take one pill with supper daily  30 tablet  1  . Water For Irrigation, Sterile (FREE WATER) SOLN Place 160 mLs into feeding tube 4 (four) times daily - after meals and at bedtime.       No current facility-administered medications on file prior to visit.    Allergies  Allergen Reactions  . Olmesartan Medoxomil Cough    REACTION: ? if cough  . Tetracycline Hcl     Unknown reaction, too long for  patient to remember   . Venlafaxine     REACTION: severe dry moouth  . Adhesive (Tape) Rash    Past Medical History  Diagnosis Date  . Depression   . DVT (deep venous thrombosis)     hx of on HRT left leg ~2006  . GERD (gastroesophageal reflux disease)   . Hyperlipidemia   . Hypertension   . Hypothyroidism   . PPD positive, treated     rx inh   . OSA on CPAP   . Diabetes mellitus   . 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 Breast cancer     a. 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 DJD (degenerative joint disease) of lumbar spine   . Chronic diastolic CHF (congestive heart failure)     a. 08/2012 Echo: EF 55-60%, no rwma, mod MR.  [name] A-fib     a. on amio for rate control/coumadin.  [name] Physical deconditioning     Past Surgical History  Procedure Laterality Date  . Tubal ligation    . Cholecystectomy    . Foot surgery    . Eye surgery    . Cataract extraction    . Percutaneous liver biopsy    . Breast biopsy      left breast  . Mastectomy modified radical  06/08/2012    Procedure: MASTECTOMY MODIFIED RADICAL;  Surgeon: Rhonda Saa, MD;  Location: [facility] OR;  Service: General;  Laterality: Right;  . Portacath placement  06/08/2012    Procedure: INSERTION PORT-A-CATH;  Surgeon: Rhonda Saa, MD;  Location: [facility] OR;  Service: General;  Laterality: Left;  . Peg tube placement      History  Smoking status  . Former Smoker  Smokeless tobacco  . Never Used    History  Alcohol Use No    Family History  Problem Relation Age of Onset  . Diabetes Mother   . Hypertension Mother   . Arthritis Mother   . Heart disease Mother   . Heart failure Mother   . Other Mother     benign breast mass  . Stroke Father   . Heart disease Father   . Diabetes Paternal Grandmother   . Colon cancer Paternal Grandfather     Review of  Systems: The review of systems is per the HPI.  All other systems were reviewed and are negative.  Physical Exam: BP 108/66  Pulse 72  Ht 5\' 4"  (1.626 m)  Wt 155 lb 1.9 oz (70.362 kg)  BMI 26.61 kg/m2 Weight  was 212 in April of 2013. Patient is alert and in no acute distress. She looks chronically ill. Skin is warm and dry. Color quite sallow.   HEENT is unremarkable. Normocephalic/atraumatic. PERRL. Sclera are nonicteric. Neck is supple. No masses. No JVD. Lungs are clear. Cardiac exam shows a regular rate and rhythm. Abdomen is soft. Extremities are without edema. Gait and ROM are intact. No gross neurologic deficits noted.  LABORATORY DATA: Pending  Lab Results  Component Value Date   WBC 8.8 09/27/2012   HGB 8.9* 09/27/2012   HCT 28.2* 09/27/2012   PLT 353 09/27/2012   GLUCOSE 84 09/27/2012   CHOL 168 10/14/2011   TRIG 93.0 10/14/2011   HDL 48.70 10/14/2011   LDLDIRECT 160.2 09/23/2010   LDLCALC 101* 10/14/2011   ALT 26 09/20/2012   AST 46* 09/20/2012   NA 136 09/27/2012   K 4.2 09/27/2012   CL 104 09/27/2012   CREATININE 0.51 09/27/2012   BUN 10 09/27/2012   CO2 26 09/27/2012   TSH 10.307* 09/16/2012   INR 1.7 10/14/2012   HGBA1C 6.1* 08/24/2012   MICROALBUR 0.92 05/03/2012   Echo Study Conclusions from April 2014  - Left ventricle: The cavity size was normal. Wall thickness was increased in a pattern of mild LVH. There was focal basal hypertrophy. Systolic function was normal. The estimated ejection fraction was in the range of 55% to 60%. Wall motion was normal; there were no regional wall motion abnormalities. - Mitral valve: Moderate regurgitation.  Lab Results  Component Value Date   INR 1.7 10/14/2012   INR 1.7 10/07/2012   INR 1.8 10/01/2012     Assessment / Plan:  1. PAF - off of amiodarone - remains on her coumadin - would like to check thru her PCP. She is in sinus by EKG today.   2. Diastolic heart failure - looks compensated today.   3. Breast cancer - seeing oncology  and radiation oncology later this month  4. Cirrhosis - says this is followed by primary care as well.  5. HTN - blood pressure looks fine.   I will get her back to see Dr. Elease [name] in a month. Seeing oncology and radiation oncology later this month for her breast cancer.   Patient is agreeable to this plan and will call if any problems develop in the interim.   Rosalio Macadamia, RN, ANP-C [facility] [address] [place], [place]  [address]

## 2012-10-15 NOTE — Progress Notes (Signed)
Subjective:    Patient ID: [name], female    DOB: 08-05-1955, 57 y.o.   MRN: [id number]  HPI  [name] is a 58 yr old female who presents today for hospital follow up.  Records are reviewed. She was hospitalized 4/29-5/5.   Had severe diarrhea from her treatment.  She had a peg tube because she has "no taste for food. "  She is not eating any food at all.  She is drinking water and juice.    Depression-  She reports that she tires easily.  But mood is good.  She continues to follow with Dr. Noe Gens.    Depression- She was evaluated by Dr. Wylene Simmer during her hospilization.  She was arranged to follow with Dr. Noe Gens as an outpatient.  She was started on klonopin bid during her hospitalizaiton.    She continues tube feeds viat PEG. She completed HH PT.    AF- new this past hospitalization. She is getting her PT/INR's checked at the coumadin clinic.    Breast Cancer-  She will start radiation.  She has completed 2/4 cycles.     Review of Systems  Respiratory: Negative for shortness of breath.   Cardiovascular: Negative for chest pain, palpitations and leg swelling.  Gastrointestinal: Negative for nausea, vomiting and diarrhea.       Past Medical History  Diagnosis Date  . Depression   . DVT (deep venous thrombosis)     hx of on HRT left leg ~2006  . GERD (gastroesophageal reflux disease)   . Hyperlipidemia   . Hypertension   . Hypothyroidism   . PPD positive, treated     rx inh   . OSA on CPAP   . Diabetes mellitus   . 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 Breast cancer     a. 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 DJD (degenerative joint disease) of lumbar spine   . Chronic diastolic CHF (congestive heart failure)     a. 08/2012 Echo: EF 55-60%, no rwma, mod MR.  [name] A-fib     a. on amio for rate control/coumadin.  [name] Physical  deconditioning     History   Social History  . Marital Status: Married    Spouse Name: N/A    Number of Children: 2  . Years of Education: N/A   Occupational History  . SECRETARY [employer]   Social History Main Topics  . Smoking status: Former Games developer  . Smokeless tobacco: Never Used  . Alcohol Use: No  . Drug Use: No  . Sexually Active: No   Other Topics Concern  . 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     Past Surgical History  Procedure Laterality Date  . Tubal ligation    . Cholecystectomy    . Foot surgery    . Eye surgery    . Cataract extraction    . Percutaneous liver biopsy    . Breast biopsy      left breast  . Mastectomy modified radical  06/08/2012    Procedure: MASTECTOMY  MODIFIED RADICAL;  Surgeon: Mariella Saa, MD;  Location: [facility] OR;  Service: General;  Laterality: Right;  . Portacath placement  06/08/2012    Procedure: INSERTION PORT-A-CATH;  Surgeon: Mariella Saa, MD;  Location: [facility] OR;  Service: General;  Laterality: Left;  . Peg tube placement      Family History  Problem Relation Age of Onset  . Diabetes Mother   . Hypertension Mother   . Arthritis Mother   . Heart disease Mother   . Heart failure Mother   . Other Mother     benign breast mass  . Stroke Father   . Heart disease Father   . Diabetes Paternal Grandmother   . Colon cancer Paternal Grandfather     Allergies  Allergen Reactions  . Olmesartan Medoxomil Cough    REACTION: ? if cough  . Tetracycline Hcl     Unknown reaction, too long for patient to remember   . Venlafaxine     REACTION: severe dry moouth  . Adhesive (Tape) Rash    Current Outpatient Prescriptions on File Prior to Visit  Medication Sig Dispense Refill  . Alum & Mag Hydroxide-Simeth (MAGIC MOUTHWASH  W/LIDOCAINE) SOLN Take 5 mLs by mouth as needed.      [name] antiseptic oral rinse (BIOTENE) LIQD 15 mLs by Mouth Rinse route 2 (two) times daily as needed.  100 mL  3  . buPROPion (WELLBUTRIN SR) 150 MG 12 hr tablet Take 150 mg by mouth every morning.       . cholestyramine (QUESTRAN) 4 G packet Take 1 packet (4 g total) by mouth daily as needed. For diarrhea.  60 each  3  . clonazePAM (KLONOPIN) 0.5 MG tablet Take 0.5 tablets (0.25 mg total) by mouth 2 (two) times daily. For anxiety  60 tablet  1  . escitalopram (LEXAPRO) 10 MG tablet Take 1 tablet (10 mg total) by mouth at bedtime.  31 tablet  0  . furosemide (LASIX) 80 MG tablet Take 0.5 tablets (40 mg total) by mouth daily.  62 tablet  0  . letrozole (FEMARA) 2.5 MG tablet Take 1 tablet (2.5 mg total) by mouth daily.  30 tablet  3  . levothyroxine (SYNTHROID, LEVOTHROID) 125 MCG tablet Take 125 mcg by mouth daily before breakfast.      . lidocaine-prilocaine (EMLA) cream Apply 1 application topically as needed (for port access).      [name] loperamide (IMODIUM) 2 MG capsule Take 1 capsule (2 mg total) by mouth 3 (three) times daily before meals.  30 capsule  0  . metoprolol tartrate (LOPRESSOR) 25 MG tablet Take 1 tablet (25 mg total) by mouth 2 (two) times daily.  180 tablet  0  . Nutritional Supplements (FEEDING SUPPLEMENT, OSMOLITE 1.5 CAL,) LIQD Place 237 mLs into feeding tube 2 (two) times daily after a meal. Breakfast and lunch.    0  . OLANZapine (ZYPREXA) 2.5 MG tablet Take 1 tablet (2.5 mg total) by mouth at bedtime. To help with sleep/anxiety.  30 tablet  1  . ondansetron (ZOFRAN ODT) 4 MG disintegrating tablet Take 1 tablet (4 mg total) by mouth every 8 (eight) hours as needed for nausea.  20 tablet  0  . potassium chloride (K-DUR) 10 MEQ tablet Take 2 tablets (20 mEq total) by mouth daily.  120 tablet  1  . saccharomyces boulardii (FLORASTOR) 250 MG capsule Take 1 capsule (250 mg total) by mouth 2 (two) times daily.  60 capsule  1  .  sodium  chloride 0.9 % injection 10-40 mLs by Intracatheter route as needed (flush).  5 mL  3  . spironolactone (ALDACTONE) 25 MG tablet Take 1 tablet (25 mg total) by mouth daily. diuretic  30 tablet  1  . warfarin (COUMADIN) 3 MG tablet Take one pill with supper daily  30 tablet  1  . Water For Irrigation, Sterile (FREE WATER) SOLN Place 160 mLs into feeding tube 4 (four) times daily - after meals and at bedtime.       No current facility-administered medications on file prior to visit.    BP 110/76  Pulse 75  Temp(Src) 98.1 F (36.7 C) (Oral)  Resp 16  Wt 156 lb (70.761 kg)  BMI 26.76 kg/m2  SpO2 97%    Objective:   Physical Exam  Constitutional: She is oriented to person, place, and time. She appears well-developed.  Chronically ill appearing white female, wearing wig, NAD  Cardiovascular: Normal rate and regular rhythm.   No murmur heard. Pulmonary/Chest: Effort normal and breath sounds normal. No respiratory distress. She has no wheezes. She has no rales. She exhibits no tenderness.  Musculoskeletal: She exhibits no edema.  2+ bilateral LE edema  Neurological: She is alert and oriented to person, place, and time.  Skin: Skin is warm and dry.  Psychiatric: She has a normal mood and affect. Her behavior is normal. Judgment and thought content normal.          Assessment & Plan:

## 2012-10-15 NOTE — Patient Instructions (Addendum)
We will check labs today  Stay on your current medicines  Dr. Elease Hashimoto in a month  Call the [facility] office at [phone] if you have any questions, problems or concerns.

## 2012-10-17 ENCOUNTER — Encounter: Payer: Self-pay | Admitting: Family

## 2012-10-17 ENCOUNTER — Telehealth: Payer: Self-pay | Admitting: Family

## 2012-10-17 DIAGNOSIS — K7460 Unspecified cirrhosis of liver: Secondary | ICD-10-CM

## 2012-10-17 NOTE — Assessment & Plan Note (Signed)
She has followed with Dr. Juanda Chance in the past, will arrange follow up visit.

## 2012-10-17 NOTE — Assessment & Plan Note (Signed)
Rate stable.  Management per cardiology.  

## 2012-10-17 NOTE — Assessment & Plan Note (Signed)
Improved, management per psychiatry.  

## 2012-10-17 NOTE — Assessment & Plan Note (Signed)
She continues poor PO intake due to lack of taste.  Continue supplemental peg feeds.

## 2012-10-17 NOTE — Assessment & Plan Note (Signed)
Management per oncology/radiation oncology.

## 2012-10-17 NOTE — Telephone Encounter (Signed)
See my chart message

## 2012-10-18 ENCOUNTER — Other Ambulatory Visit: Payer: Self-pay | Admitting: *Deleted

## 2012-10-18 DIAGNOSIS — D509 Iron deficiency anemia, unspecified: Secondary | ICD-10-CM

## 2012-10-20 ENCOUNTER — Telehealth: Payer: Self-pay

## 2012-10-20 MED ORDER — CLONAZEPAM 0.5 MG PO TABS: 0.2500 mg | ORAL_TABLET | Freq: Two times a day (BID) | ORAL | Status: DC

## 2012-10-20 NOTE — Telephone Encounter (Signed)
Clonazepam refilled 

## 2012-10-20 NOTE — Telephone Encounter (Signed)
Patient is requesting clonazepam early.  She says that when she splits the tablets they disintegrate and she has had to waste some tablets.  Please advise.

## 2012-10-20 NOTE — Telephone Encounter (Signed)
We can fill early

## 2012-10-21 ENCOUNTER — Ambulatory Visit (INDEPENDENT_AMBULATORY_CARE_PROVIDER_SITE_OTHER): Payer: 59 | Admitting: *Deleted

## 2012-10-21 ENCOUNTER — Telehealth: Payer: Self-pay | Admitting: Family

## 2012-10-21 ENCOUNTER — Ambulatory Visit: Payer: 59 | Admitting: Psychiatry

## 2012-10-21 ENCOUNTER — Other Ambulatory Visit (INDEPENDENT_AMBULATORY_CARE_PROVIDER_SITE_OTHER): Payer: 59

## 2012-10-21 DIAGNOSIS — Z86718 Personal history of other venous thrombosis and embolism: Secondary | ICD-10-CM

## 2012-10-21 DIAGNOSIS — D509 Iron deficiency anemia, unspecified: Secondary | ICD-10-CM

## 2012-10-21 DIAGNOSIS — I4891 Unspecified atrial fibrillation: Secondary | ICD-10-CM

## 2012-10-21 LAB — CBC WITH DIFFERENTIAL/PLATELET
Basophils Absolute: 0 10*3/uL (ref 0.0–0.1)
Basophils Relative: 0.2 % (ref 0.0–3.0)
Eosinophils Absolute: 0 10*3/uL (ref 0.0–0.7)
Eosinophils Relative: 0.4 % (ref 0.0–5.0)
HCT: 33 % — ABNORMAL LOW (ref 36.0–46.0)
Hemoglobin: 10.6 g/dL — ABNORMAL LOW (ref 12.0–15.0)
Lymphocytes Relative: 19.3 % (ref 12.0–46.0)
Lymphs Abs: 1.3 10*3/uL (ref 0.7–4.0)
MCHC: 32.2 g/dL (ref 30.0–36.0)
MCV: 90.3 fl (ref 78.0–100.0)
Monocytes Absolute: 0.5 10*3/uL (ref 0.1–1.0)
Monocytes Relative: 7.1 % (ref 3.0–12.0)
Neutro Abs: 4.8 10*3/uL (ref 1.4–7.7)
Neutrophils Relative %: 73 % (ref 43.0–77.0)
Platelets: 105 10*3/uL — ABNORMAL LOW (ref 150.0–400.0)
RBC: 3.66 Mil/uL — ABNORMAL LOW (ref 3.87–5.11)
RDW: 19.4 % — ABNORMAL HIGH (ref 11.5–14.6)
WBC: 6.6 10*3/uL (ref 4.5–10.5)

## 2012-10-21 MED ORDER — METOPROLOL TARTRATE 25 MG PO TABS: 25.0000 mg | ORAL_TABLET | Freq: Two times a day (BID) | ORAL | Status: DC

## 2012-10-21 MED ORDER — WARFARIN SODIUM 3 MG PO TABS: ORAL_TABLET | ORAL | Status: DC

## 2012-10-21 MED ORDER — FUROSEMIDE 80 MG PO TABS: 40.0000 mg | ORAL_TABLET | Freq: Every day | ORAL | Status: DC

## 2012-10-21 NOTE — Telephone Encounter (Signed)
Refill called to Morrie Sheldon at [facility]. Pharmacy also requested refill of furosemide. Reports that previous rx's from 09/28/12 had quantities changed to 1 month supply on each: lasix and metoprolol.

## 2012-10-21 NOTE — Telephone Encounter (Signed)
Please send metoprolol refill to [facility] pharmacy, patient is there now.

## 2012-10-25 ENCOUNTER — Inpatient Hospital Stay: Payer: 59 | Admitting: Physical Medicine & Rehabilitation

## 2012-10-28 ENCOUNTER — Encounter: Payer: Self-pay | Admitting: Radiation Oncology

## 2012-10-28 ENCOUNTER — Ambulatory Visit (INDEPENDENT_AMBULATORY_CARE_PROVIDER_SITE_OTHER): Payer: 59 | Admitting: *Deleted

## 2012-10-28 DIAGNOSIS — Z86718 Personal history of other venous thrombosis and embolism: Secondary | ICD-10-CM

## 2012-10-28 DIAGNOSIS — I4891 Unspecified atrial fibrillation: Secondary | ICD-10-CM

## 2012-10-28 LAB — POCT INR: INR: 1.3

## 2012-10-28 NOTE — Progress Notes (Signed)
Location of Breast Cancer:  Right bx=05/21/12  Histology per Pathology Report:  05/21/12 ;Diagnosis 1. Lymph node, needle/core biopsy, mass, right axilla - POSITIVE FOR DUCTAL CARCINOMA. - SEE COMMENT. 2. Breast, right, needle core biopsy, mass, subareolar - INVASIVE DUCTAL CARCINOMA WITH ASSOCIATED CALCIFICATIONS. - DUCTAL CARCINOMA IN SITU.  Receptor Status: ER(+), PR (+), Her2-neu (-)  Did patient present with symptoms :patient noticed mass right breast mid December 2013, brought to primary MD 2 weeks later not resolved,,previous mammogram 02/2007, had mammogram 05/10/12 =5cm mass subareolar portion of right breast /firm,palpanle  Past/Anticipated interventions by surgeon, if any:   06/08/12:  Breast, modified radical mastectomy , Right - INVASIVE DUCTAL CARCINOMA, GRADE II/III, SPANNING 5.8 CM. - DUCTAL CARCINOMA IN SITU, LOW GRADE. - LYMPHOVASCULAR INVASION IS IDENTIFIED. - PERINEURAL INVASION IS IDENTIFIED. - METASTATIC CARCINOMA IN 2 OF 18 LYMPH NODES (2/18). - THE SURGICAL RESECTION MARGINS ARE NEGATIVE FOR CARCINOMA Dr. Glenna Fellows, Port acath placement :06/08/12 Dr. Johna Sheriff,  Had peg tube placement  Past/Anticipated interventions by medical oncology, if any: Chemotherapy' January 2014 , docetaxel/cyclophosphamide started 07/06/12-07/29/12 stopped, intolerant(A-FIB and diastolic heart failure) Lymphedema issues, if any: NO  Pain issues, if any: SAFETY ISSUES: FTT /progressive weakness/Prior radiation? NO  Pacemaker/ICD? no  Possible current pregnancy?no,   Is the patient on methotrexate? no  Current Complaints / other details:  Married, Ambulance person Emergency , no family history of breast or ovarian cancer ,father died 56's hx dementia,prior strokes,mother died 34's,hx chf, had benign breast mass Menarche age 75,GXP2,,1st live birth age 47, menopause 22 y ago,on HRT until 2010  severe depression/required psych evaluation , FTT, has peg tube, chronic liver  disease, on c-pap hxDVT on Coumadin  51 days in hospital d/c 09/29/12 peg feedings Osmolite  1.5  1 can in am, 1 can noon, 1.5 can 4pm. 1.5 8-9pm, no appetite doesn't eat food, no taste stated, no pain       Lowella Petties, RN 10/28/2012,1:51 PM

## 2012-10-29 ENCOUNTER — Telehealth: Payer: Self-pay | Admitting: *Deleted

## 2012-10-29 ENCOUNTER — Ambulatory Visit (HOSPITAL_BASED_OUTPATIENT_CLINIC_OR_DEPARTMENT_OTHER): Payer: 59 | Admitting: Oncology

## 2012-10-29 ENCOUNTER — Encounter: Payer: Self-pay | Admitting: Physical Medicine & Rehabilitation

## 2012-10-29 ENCOUNTER — Encounter: Payer: Self-pay | Admitting: Radiation Oncology

## 2012-10-29 ENCOUNTER — Ambulatory Visit (HOSPITAL_BASED_OUTPATIENT_CLINIC_OR_DEPARTMENT_OTHER): Payer: 59 | Admitting: Lab

## 2012-10-29 ENCOUNTER — Encounter: Payer: 59 | Attending: Physical Medicine & Rehabilitation | Admitting: Physical Medicine & Rehabilitation

## 2012-10-29 ENCOUNTER — Ambulatory Visit
Admit: 2012-10-29 | Discharge: 2012-10-29 | Disposition: A | Discharge status: Home / Self Care | Payer: 59 | Attending: Radiation Oncology | Admitting: Radiation Oncology

## 2012-10-29 ENCOUNTER — Other Ambulatory Visit: Payer: 59 | Admitting: Lab

## 2012-10-29 VITALS — BP 130/76 | HR 66 | Temp 98.2°F | Resp 20 | Ht 64.0 in | Wt 155.9 lb

## 2012-10-29 VITALS — BP 125/72 | HR 73 | Resp 16 | Ht 66.0 in | Wt 155.0 lb

## 2012-10-29 VITALS — BP 134/81 | HR 79 | Temp 97.5°F | Resp 20 | Ht 64.0 in | Wt 156.1 lb

## 2012-10-29 DIAGNOSIS — E43 Unspecified severe protein-calorie malnutrition: Secondary | ICD-10-CM

## 2012-10-29 DIAGNOSIS — Z9010 Acquired absence of unspecified breast and nipple: Secondary | ICD-10-CM | POA: Insufficient documentation

## 2012-10-29 DIAGNOSIS — C50919 Malignant neoplasm of unspecified site of unspecified female breast: Secondary | ICD-10-CM | POA: Insufficient documentation

## 2012-10-29 DIAGNOSIS — R627 Adult failure to thrive: Secondary | ICD-10-CM | POA: Insufficient documentation

## 2012-10-29 DIAGNOSIS — G4733 Obstructive sleep apnea (adult) (pediatric): Secondary | ICD-10-CM | POA: Insufficient documentation

## 2012-10-29 DIAGNOSIS — E119 Type 2 diabetes mellitus without complications: Secondary | ICD-10-CM | POA: Insufficient documentation

## 2012-10-29 DIAGNOSIS — C50911 Malignant neoplasm of unspecified site of right female breast: Secondary | ICD-10-CM

## 2012-10-29 DIAGNOSIS — R5381 Other malaise: Secondary | ICD-10-CM | POA: Insufficient documentation

## 2012-10-29 DIAGNOSIS — Z9221 Personal history of antineoplastic chemotherapy: Secondary | ICD-10-CM | POA: Insufficient documentation

## 2012-10-29 DIAGNOSIS — Z5181 Encounter for therapeutic drug level monitoring: Secondary | ICD-10-CM

## 2012-10-29 DIAGNOSIS — I10 Essential (primary) hypertension: Secondary | ICD-10-CM | POA: Insufficient documentation

## 2012-10-29 DIAGNOSIS — R630 Anorexia: Secondary | ICD-10-CM | POA: Insufficient documentation

## 2012-10-29 DIAGNOSIS — Z86718 Personal history of other venous thrombosis and embolism: Secondary | ICD-10-CM | POA: Insufficient documentation

## 2012-10-29 DIAGNOSIS — F329 Major depressive disorder, single episode, unspecified: Secondary | ICD-10-CM | POA: Insufficient documentation

## 2012-10-29 DIAGNOSIS — M47817 Spondylosis without myelopathy or radiculopathy, lumbosacral region: Secondary | ICD-10-CM | POA: Insufficient documentation

## 2012-10-29 DIAGNOSIS — K219 Gastro-esophageal reflux disease without esophagitis: Secondary | ICD-10-CM | POA: Insufficient documentation

## 2012-10-29 DIAGNOSIS — Z79899 Other long term (current) drug therapy: Secondary | ICD-10-CM

## 2012-10-29 DIAGNOSIS — Z9089 Acquired absence of other organs: Secondary | ICD-10-CM | POA: Insufficient documentation

## 2012-10-29 DIAGNOSIS — M6250 Muscle wasting and atrophy, not elsewhere classified, unspecified site: Secondary | ICD-10-CM | POA: Insufficient documentation

## 2012-10-29 DIAGNOSIS — R5383 Other fatigue: Secondary | ICD-10-CM | POA: Insufficient documentation

## 2012-10-29 DIAGNOSIS — E785 Hyperlipidemia, unspecified: Secondary | ICD-10-CM | POA: Insufficient documentation

## 2012-10-29 DIAGNOSIS — E41 Nutritional marasmus: Secondary | ICD-10-CM

## 2012-10-29 DIAGNOSIS — F3289 Other specified depressive episodes: Secondary | ICD-10-CM | POA: Insufficient documentation

## 2012-10-29 DIAGNOSIS — Z170 Estrogen receptor positive status [ER+]: Secondary | ICD-10-CM | POA: Insufficient documentation

## 2012-10-29 HISTORY — DX: Anemia, unspecified: D64.9

## 2012-10-29 HISTORY — DX: Unspecified cataract: H26.9

## 2012-10-29 HISTORY — DX: Allergy, unspecified, initial encounter: T78.40XA

## 2012-10-29 LAB — CBC WITH DIFFERENTIAL/PLATELET
Basophils Absolute: 0 10*3/uL (ref 0.0–0.1)
HCT: 35.4 % (ref 34.8–46.6)
HGB: 11.7 g/dL (ref 11.6–15.9)
MCH: 29.7 pg (ref 25.1–34.0)
MONO#: 0.5 10*3/uL (ref 0.1–0.9)
NEUT%: 71.8 % (ref 38.4–76.8)
Platelets: 163 10*3/uL (ref 145–400)
WBC: 6.3 10*3/uL (ref 3.9–10.3)
lymph#: 1.1 10*3/uL (ref 0.9–3.3)

## 2012-10-29 LAB — COMPREHENSIVE METABOLIC PANEL (CC13)
BUN: 11.6 mg/dL (ref 7.0–26.0)
CO2: 26 mEq/L (ref 22–29)
Calcium: 9.4 mg/dL (ref 8.4–10.4)
Chloride: 104 mEq/L (ref 98–107)
Creatinine: 0.6 mg/dL (ref 0.6–1.1)
Glucose: 67 mg/dl — ABNORMAL LOW (ref 70–99)

## 2012-10-29 NOTE — Progress Notes (Signed)
Subjective:    Patient ID: [name], female    DOB: 1955/11/20, 57 y.o.   MRN: [id number]  HPI  [name] is back regarding her rehab stay and deconditioning. She has done well with her functional progress. She is now walking without a device. She still has little appetite. She is taking osmolyte through her tube. She does like milk shakes and drinks a lot of these. She really hasn't found anything else that stimulates her appetite.   She follows her weight at home and actually has gained 2 lbs since hospital dc.    She has returned to driving her car!   She has radiation planned apparently later this summer.   Pain Inventory Average Pain 0 Pain Right Now 0 My pain is na  In the last 24 hours, has pain interfered with the following? General activity 0 Relation with others 0 Enjoyment of life 0 What TIME of day is your pain at its worst? na Sleep (in general) Good  Pain is worse with: na Pain improves with: na Relief from Meds: 0  Mobility walk without assistance ability to climb steps?  yes do you drive?  yes  Function Do you have any goals in this area?  no  Neuro/Psych depression  Prior Studies Any changes since last visit?  no  Physicians involved in your care Any changes since last visit?  no   Family History  Problem Relation Age of Onset  . Diabetes Mother   . Hypertension Mother   . Arthritis Mother   . Heart disease Mother   . Heart failure Mother   . Other Mother     benign breast mass  . Stroke Father   . Heart disease Father   . Diabetes Paternal Grandmother   . Colon cancer Paternal Grandfather    History   Social History  . Marital Status: Married    Spouse Name: N/A    Number of Children: 2  . Years of Education: N/A   Occupational History  . SECRETARY [employer]   Social History Main Topics  . Smoking status: Former Smoker -- 1.00 packs/day for 1 years  . Smokeless tobacco: Never Used  . Alcohol Use: No  . Drug  Use: No     Comment: quit age 16 only smoked as a teen 1 year  . Sexually Active: No   Other Topics Concern  . None   Social History Narrative   Married   Works at [employer] secretary   Daily caffeine use - 2 cups a day plus a couple of sodas a day   Pt doesn't exercise regularly   G2P2   H H of 5 soon to be 6 .    Past Surgical History  Procedure Laterality Date  . Tubal ligation    . Cholecystectomy    . Foot surgery    . Eye surgery    . Cataract extraction    . Percutaneous liver biopsy    . Breast biopsy      left breast  . Mastectomy modified radical  06/08/2012    Procedure: MASTECTOMY MODIFIED RADICAL;  Surgeon: Mariella Saa, MD;  Location: [facility] OR;  Service: General;  Laterality: Right;  . Portacath placement  06/08/2012    Procedure: INSERTION PORT-A-CATH;  Surgeon: Mariella Saa, MD;  Location: [facility] OR;  Service: General;  Laterality: Left;  . Peg tube placement     Past Medical History  Diagnosis Date  .  Depression   . DVT (deep venous thrombosis)     hx of on HRT left leg ~2006  . GERD (gastroesophageal reflux disease)   . Hyperlipidemia   . Hypertension   . Hypothyroidism   . PPD positive, treated     rx inh   . OSA on CPAP   . Diabetes mellitus   . 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 Breast cancer     a. 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 DJD (degenerative joint disease) of lumbar spine   . Chronic diastolic CHF (congestive heart failure)     a. 08/2012 Echo: EF 55-60%, no rwma, mod MR.  [name] A-fib     a. on amio for rate control/coumadin.  [name] Physical deconditioning   . Allergy   . Anemia     low iron hx  . Blood transfusion without reported diagnosis     2 u prbc  . Cataract     removed ou   BP 125/72  Pulse 73  Resp 16  Ht 5\' 6"  (1.676 m)  Wt 155 lb (70.308 kg)  BMI 25.03 kg/m2  SpO2  99%     Review of Systems  Constitutional: Positive for appetite change.  Respiratory: Positive for apnea.   Psychiatric/Behavioral: Positive for dysphoric mood.  All other systems reviewed and are negative.       Objective:   Physical Exam  General: Alert and oriented x 3, She has lost weight, her color has improved (she's actually tan!) and she is wearing a wig/makeup HEENT: Head is normocephalic, atraumatic, PERRLA, EOMI, sclera anicteric, oral mucosa pink and moist, dentition intact, ext ear canals clear,  Neck: Supple without JVD or lymphadenopathy Heart: Reg rate and rhythm. No murmurs rubs or gallops Chest: CTA bilaterally without wheezes, rales, or rhonchi; no distress Abdomen: Soft, non-tender, non-distended, bowel sounds positive. Extremities: No clubbing, cyanosis, 0 to trace  edema. Pulses are 2+ Skin: Clean and intact without signs of breakdown Neuro: Pt is cognitively appropriate with normal insight, memory, and awareness. Cranial nerves 2-12 are intact. Sensory exam is normal except for decreased distal LT in the LE's. Reflexes are 1+ in all 4's. Fine motor coordination is intact. No tremors. Motor function is grossly 4-5/5. She has a slightly shuffling gait but is steady and takes her time. May have mild STM deficits.  Musculoskeletal: Full ROM, No pain with AROM or PROM in the neck, trunk, or extremities. Posture appropriate Psych: Pt's affect is appropriate. Pt is cooperative         Assessment & Plan:  1. Deconditioning related to her breast cancer,depression, FTT.  She has made remarkable progress! I am very happy for her!!!  -Continue with HEP. Recommend aquatic based activities especially over the summer to build up her core and lower ext strength, increase balance. -Recommend adding protein to her shakes to give her additional nutrition and muscle mass.  -Recommended checking weights at least 3 days per week to track fluid, nutritional states.   At  this point, she is doing quite well, and I have nothing further to offer her. She may call our office if needed in the future. 15 minutes of face to face patient care time were spent during this visit. All questions were encouraged and answered.

## 2012-10-29 NOTE — Patient Instructions (Signed)
CONSIDER AQUATIC BASED EXERCISES TO INCREASE YOUR STAMINA AND STRENGTH.

## 2012-10-29 NOTE — Progress Notes (Signed)
Please see the Nurse Progress Note in the MD Initial Consult Encounter for this patient. 

## 2012-10-29 NOTE — Telephone Encounter (Signed)
Pt came back in a few minutes later stating when she return she wants to be scheduled w/[facility]. gv appt d/t for [facility] on 12/30/12@11am ..appts made and printed...td

## 2012-10-29 NOTE — Progress Notes (Signed)
Radiation Oncology         [phone]) [phone] ________________________________  Initial outpatient Consultation - Date: 10/29/2012   Name: [name] MRN: [id number]   DOB: [birth year]  REFERRING PHYSICIAN: Magrinat, Valentino Hue, MD  DIAGNOSIS: The encounter diagnosis was Cancer of breast, right.  HISTORY OF PRESENT ILLNESS::[name] is a 57 y.o. female  who was diagnosed with a right breast cancer in January of this year. She palpated a right breast mass. Spiculated mass was noted in the right breast measuring 5 cm associated with calcifications. This was firm and palpable. Ultrasound confirmed this mass and an abnormal-appearing right axillary lymph node was also noted measuring 2.6 cm. Biopsy of the breast mass and the lymph node was performed on 05/21/2012 which showed invasive ductal carcinoma grade 2 in both masses. The breast mass was 100% estrogen positive and 73% progesterone positive. The mass was HER-2 negative. Breast MRI on 05/29/2012 confirmed a 4.4 cm retroareolar right breast mass with a satellite nodule in the inferior to this mass measuring 1.7 cm. The enlarged lymph node was noted as well as other mildly enhancing level one right axillary lymph nodes. No abnormalities were noted in the left breast. No other enlarged lymph nodes. She elected for upfront mastectomy and had that performed on 06/08/2012. This showed a 5.8 cm invasive ductal carcinoma with associated low-grade ductal carcinoma in situ. Lymphovascular and perineural invasion was noted. Metastatic carcinoma was noted into out of 18 lymph nodes. The closest margin of resection was the posterior margin which was 2.4 cm. The tumor was ER/PR positive at 100% with a Ki-67 of 20%. The final pathologic stage was T3 N1. She then underwent adjuvant chemotherapy. She had 2 cycles of Taxotere and cyclophosphamide but was unfortunately hospitalized after her second cycle in March of this year. She remained in the hospital for almost 2  months and ultimately was discharged to rehabilitation due to failure to thrive. She has undergone rehabilitation and is back at home. She is actually walking without a cane or walker. It is remarkable that during her hospital stay hospice was actually considered do to her poor functional status. She and her husband have been through quite a roller coaster in getting her out of the hospital and here today. She reports fatigue and poor appetite. She has no breast or chest wall related complaints. She has not noticed any unusual arm swelling soreness or new back pain or headaches.[name]  PREVIOUS RADIATION THERAPY: No  PAST MEDICAL HISTORY:  has a past medical history of Depression; DVT (deep venous thrombosis); GERD (gastroesophageal reflux disease); Hyperlipidemia; Hypertension; Hypothyroidism; PPD positive, treated; OSA on CPAP; Diabetes mellitus; Liver disease, chronic, with cirrhosis; Breast cancer; DJD (degenerative joint disease) of lumbar spine; Chronic diastolic CHF (congestive heart failure); A-fib; Physical deconditioning; Allergy; Anemia; Blood transfusion without reported diagnosis; and Cataract.    PAST SURGICAL HISTORY: Past Surgical History  Procedure Laterality Date  . Tubal ligation    . Cholecystectomy    . Foot surgery    . Eye surgery    . Cataract extraction    . Percutaneous liver biopsy    . Breast biopsy      left breast  . Mastectomy modified radical  06/08/2012    Procedure: MASTECTOMY MODIFIED RADICAL;  Surgeon: Mariella Saa, MD;  Location: [facility] OR;  Service: General;  Laterality: Right;  . Portacath placement  06/08/2012    Procedure: INSERTION PORT-A-CATH;  Surgeon: Mariella Saa, MD;  Location: [facility] OR;  Service: General;  Laterality: Left;  . Peg tube placement      FAMILY HISTORY:  Family History  Problem Relation Age of Onset  . Diabetes Mother   . Hypertension Mother   . Arthritis Mother   . Heart disease Mother   . Heart failure Mother   . Other  Mother     benign breast mass  . Stroke Father   . Heart disease Father   . Diabetes Paternal Grandmother   . Colon cancer Paternal Grandfather     SOCIAL HISTORY:  History  Substance Use Topics  . Smoking status: Former Smoker -- 1.00 packs/day for 1 years  . Smokeless tobacco: Never Used  . Alcohol Use: No    ALLERGIES: Olmesartan medoxomil; Tetracycline hcl; Venlafaxine; and Adhesive  MEDICATIONS:  Current Outpatient Prescriptions  Medication Sig Dispense Refill  . Alum & Mag Hydroxide-Simeth (MAGIC MOUTHWASH W/LIDOCAINE) SOLN Take 5 mLs by mouth as needed.      [name] buPROPion (WELLBUTRIN SR) 150 MG 12 hr tablet Take 150 mg by mouth every morning.       . clonazePAM (KLONOPIN) 0.5 MG tablet Take 0.5 tablets (0.25 mg total) by mouth 2 (two) times daily. For anxiety  60 tablet  2  . escitalopram (LEXAPRO) 10 MG tablet Take 1 tablet (10 mg total) by mouth at bedtime.  31 tablet  0  . furosemide (LASIX) 80 MG tablet Take 0.5 tablets (40 mg total) by mouth daily.  15 tablet  2  . letrozole (FEMARA) 2.5 MG tablet Take 1 tablet (2.5 mg total) by mouth daily.  30 tablet  3  . levothyroxine (SYNTHROID, LEVOTHROID) 125 MCG tablet Take 125 mcg by mouth daily before breakfast.      . lidocaine-prilocaine (EMLA) cream Apply 1 application topically as needed (for port access).      [name] loperamide (IMODIUM) 2 MG capsule Take 1 capsule (2 mg total) by mouth 3 (three) times daily before meals.  30 capsule  0  . metoprolol tartrate (LOPRESSOR) 25 MG tablet Take 1 tablet (25 mg total) by mouth 2 (two) times daily.  60 tablet  2  . Nutritional Supplements (FEEDING SUPPLEMENT, OSMOLITE 1.5 CAL,) LIQD Place 237 mLs into feeding tube 2 (two) times daily after a meal. Breakfast and lunch.    0  . OLANZapine (ZYPREXA) 2.5 MG tablet Take 1 tablet (2.5 mg total) by mouth at bedtime. To help with sleep/anxiety.  30 tablet  1  . potassium chloride (K-DUR) 10 MEQ tablet Take 2 tablets (20 mEq total) by mouth daily.   120 tablet  1  . saccharomyces boulardii (FLORASTOR) 250 MG capsule Take 1 capsule (250 mg total) by mouth 2 (two) times daily.  60 capsule  1  . spironolactone (ALDACTONE) 25 MG tablet Take 1 tablet (25 mg total) by mouth daily. diuretic  30 tablet  1  . warfarin (COUMADIN) 3 MG tablet Take as directed by coumadin clinic  50 tablet  3  . antiseptic oral rinse (BIOTENE) LIQD 15 mLs by Mouth Rinse route 2 (two) times daily as needed.  100 mL  3  . cholestyramine (QUESTRAN) 4 G packet Take 1 packet (4 g total) by mouth daily as needed. For diarrhea.  60 each  3  . ondansetron (ZOFRAN ODT) 4 MG disintegrating tablet Take 1 tablet (4 mg total) by mouth every 8 (eight) hours as needed for nausea.  20 tablet  0  . sodium chloride 0.9 % injection 10-40 mLs by  Intracatheter route as needed (flush).  5 mL  3  . Water For Irrigation, Sterile (FREE WATER) SOLN Place 160 mLs into feeding tube 4 (four) times daily - after meals and at bedtime.       No current facility-administered medications for this encounter.    REVIEW OF SYSTEMS:  A 15 point review of systems is documented in the electronic medical record. This was obtained by the nursing staff. However, I reviewed this with the patient to discuss relevant findings and make appropriate changes.  Pertinent items are noted in HPI.   PHYSICAL EXAM:  Filed Vitals:   10/29/12 0824  BP: 134/81  Pulse: 79  Temp: 97.5 F (36.4 C)  Resp: 20  .156 lb 1.6 oz (70.806 kg). She is a pleasant female in no distress sitting comfortably examining room chair. She has a well-healed incision over her right chest wall with no palpable or visible signs of tumor recurrence. She has no palpable sorry adenopathy. No palpable supraclavicular or cervical adenopathy.  LABORATORY DATA:  Lab Results  Component Value Date   WBC 6.6 10/21/2012   HGB 10.6* 10/21/2012   HCT 33.0* 10/21/2012   MCV 90.3 10/21/2012   PLT 105.0* 10/21/2012   Lab Results  Component Value Date   NA  136 10/15/2012   K 4.1 10/15/2012   CL 103 10/15/2012   CO2 28 10/15/2012   Lab Results  Component Value Date   ALT 39* 10/15/2012   AST 64* 10/15/2012   ALKPHOS 149* 10/15/2012   BILITOT 0.6 10/15/2012     RADIOGRAPHY: No results found.    IMPRESSION: T3 N1 invasive ductal carcinoma of the right breast status post mastectomy  PLAN: I discussed with Ms. Peets the indications for radiation including the improvement in local control seen in patients who undergo radiation with risk factors including tumor size over 5 cm and positive lymph nodes. She does have both of these risk factors and has had a incomplete course of chemotherapy due to her poor tolerance. I strongly recommended radiation to the chest wall and supraclavicular lymph nodes. We discussed 33 treatments as an outpatient. We discussed the process of simulation the placement tattoos. We discussed the possibility of heart lung and rib damage. We discussed the skin redness and fatigue as possible side effects of treatment. We discussed the low likelihood of secondary malignancies. Her husband is not work on Thursdays and Fridays so therefore I set her up for simulation next Friday. She has signed informed consent and agree to proceed forward. She has not been back for followup with surgery and I've encouraged her to make an appointment sometime in September October after radiation is complete. She is seeing Dr. [name] and Dr. Riley Kill today in followup as well. I spent 40 minutes  face to face with the patient and more than 50% of that time was spent in counseling and/or coordination of care.   ------------------------------------------------  Lurline Hare, MD

## 2012-10-29 NOTE — Telephone Encounter (Signed)
appts made and printed...td 

## 2012-10-29 NOTE — Progress Notes (Signed)
ID: [name]   DOB: 1955-07-29  MR#: [id number]  WUJ#:[id number]  PCP: [name]., NP GYN:  SU: [facility] OTHER NF:AOZHYQ [name], [name], [name]   HISTORY OF PRESENT ILLNESS: [name] noted a mass in her right breast mid December, and as it did not spontaneously resolve over a couple of weeks she brought it to her primary physician's attention. She was set up for diagnostic mammography and right breast ultrasonography at the breast Center 05/10/2012. (Note that the patient's most recent prior mammography had been in October 2008). The current study showed a spiculated mass in the superior subareolar portion of the right breast measuring approximately 5 cm and associated with pleomorphic calcifications. This was firm and palpable. There was right nipple retraction and skin thickening. Ultrasound confirmed an irregularly marginated hypoechoic mass measuring 3.5 cm by ultrasound, and an abnormal appearing lower right axillary lymph node measuring 2.6 cm.  Biopsies of both the breast mass and the abnormal appearing lymph node were performed 05/21/2012. Both showed an invasive ductal carcinoma, grade 2, with similar prognostic panels (the breast mass was 100% estrogen and 73% progesterone receptor positive, with an MIB-1 of 5%; the lymph node was 100% estrogen 100% progesterone receptor positive, with an MIB-1 of 20%). Both masses were HER-2 negative.  Breast MRI obtained at [facility] imaging 05/29/2012 confirmed a dominant mass in the retroareolar right breast measuring 4.4 cm maximally. There was a satellite nodule inferior and lateral to this mass, measuring 1.7 cm. There were no other masses in either breast. Aside from the previously biopsied lymph node there were other mildly enhancing level I right axillary lymph nodes which did not appear pathologic. There was no other lymphadenopathy noted. The patient's subsequent history is as detailed below.   INTERVAL  HISTORY: [name] returns today accompanied by her husband [name], for followup of her right breast carcinoma. Since her last visit here she has "turnaround", got her nutrition problems under control by using her PEG, the diarrhea is no longer an issue, and she is doing her housework, shopping, cooking, and driving, pretty much as she did originally. This is a major change for the better.  REVIEW OF SYSTEMS: [name] has problems with heartburn, occasional palpitations, sometimes she has bilateral ankle swelling, and she continues to have issues with anxiety and depression. She is tolerating the letrozole with no hot flashes or vaginal dryness issues. She uses can feed, one in the morning, one at lunch, 1-1/2 at supper and one half at bedtime. Her weight has been stable now for approximately 5 weeks. She knows how to troubleshoot the PEG and knows to call us for any problems that may develop relating to it. Otherwise a detailed review of systems today was noncontributory  PAST MEDICAL HISTORY: Past Medical History  Diagnosis Date  . Depression   . DVT (deep venous thrombosis)     hx of on HRT left leg ~2006  . GERD (gastroesophageal reflux disease)   . Hyperlipidemia   . Hypertension   . Hypothyroidism   . PPD positive, treated     rx inh   . OSA on CPAP   . Diabetes mellitus   . 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 Breast cancer     a. 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 DJD (degenerative joint disease) of lumbar spine   . Chronic diastolic  CHF (congestive heart failure)     a. 08/2012 Echo: EF 55-60%, no rwma, mod MR.  [name] A-fib     a. on amio for rate control/coumadin.  [name] Physical deconditioning   . Allergy   . Anemia     low iron hx  . Blood transfusion without reported diagnosis     2 u prbc  . Cataract     removed ou    PAST SURGICAL HISTORY: Past  Surgical History  Procedure Laterality Date  . Tubal ligation    . Cholecystectomy    . Foot surgery    . Eye surgery    . Cataract extraction    . Percutaneous liver biopsy    . Breast biopsy      left breast  . Mastectomy modified radical  06/08/2012    Procedure: MASTECTOMY MODIFIED RADICAL;  Surgeon: Mariella Saa, MD;  Location: [facility] OR;  Service: General;  Laterality: Right;  . Portacath placement  06/08/2012    Procedure: INSERTION PORT-A-CATH;  Surgeon: Mariella Saa, MD;  Location: [facility] OR;  Service: General;  Laterality: Left;  . Peg tube placement      FAMILY HISTORY Family History  Problem Relation Age of Onset  . Diabetes Mother   . Hypertension Mother   . Arthritis Mother   . Heart disease Mother   . Heart failure Mother   . Other Mother     benign breast mass  . Stroke Father   . Heart disease Father   . Diabetes Paternal Grandmother   . Colon cancer Paternal Grandfather    the patient's father died in his 67s with a history of dementia. He had had prior strokes. The patient's mother died in her 68s, with a history of congestive heart failure. [name] had no brothers, one sister. There is no history of breast or ovarian cancer in the family.  GYNECOLOGIC HISTORY: Menarche age 8, first live birth age 59, she is GX P2, menopause approximately 15 years ago, on hormone replacement until 2010.  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 Her husband [name] works for [employer]. Daughter [name] is a Scientist, forensic and lives in [place]. Daughter [name] and her family (husband and 2 children aged 5 and 1-1/2 years) currently live with the patient. [name] is a member of a [employer].   ADVANCED DIRECTIVES: Not in place  HEALTH MAINTENANCE: History  Substance Use Topics  . Smoking status: Former Smoker -- 1.00 packs/day for 1 years  . Smokeless tobacco: Never Used  . Alcohol  Use: No     Colonoscopy: Never  PAP: Does not recall  Bone density: Never  Lipid panel:  Allergies  Allergen Reactions  . Olmesartan Medoxomil Cough    REACTION: ? if cough  . Tetracycline Hcl     Unknown reaction, too long for patient to remember   . Venlafaxine     REACTION: severe dry moouth  . Adhesive (Tape) Rash    Current Outpatient Prescriptions  Medication Sig Dispense Refill  . Alum & Mag Hydroxide-Simeth (MAGIC MOUTHWASH W/LIDOCAINE) SOLN Take 5 mLs by mouth as needed.      [name] antiseptic oral rinse (BIOTENE) LIQD 15 mLs by Mouth Rinse route 2 (two) times daily as needed.  100 mL  3  . buPROPion (WELLBUTRIN SR) 150 MG 12 hr tablet Take 150 mg by mouth every morning.       . cholestyramine (  QUESTRAN) 4 G packet Take 1 packet (4 g total) by mouth daily as needed. For diarrhea.  60 each  3  . clonazePAM (KLONOPIN) 0.5 MG tablet Take 0.5 tablets (0.25 mg total) by mouth 2 (two) times daily. For anxiety  60 tablet  2  . escitalopram (LEXAPRO) 10 MG tablet Take 1 tablet (10 mg total) by mouth at bedtime.  31 tablet  0  . furosemide (LASIX) 80 MG tablet Take 0.5 tablets (40 mg total) by mouth daily.  15 tablet  2  . letrozole (FEMARA) 2.5 MG tablet Take 1 tablet (2.5 mg total) by mouth daily.  30 tablet  3  . levothyroxine (SYNTHROID, LEVOTHROID) 125 MCG tablet Take 125 mcg by mouth daily before breakfast.      . lidocaine-prilocaine (EMLA) cream Apply 1 application topically as needed (for port access).      [name] loperamide (IMODIUM) 2 MG capsule Take 1 capsule (2 mg total) by mouth 3 (three) times daily before meals.  30 capsule  0  . metoprolol tartrate (LOPRESSOR) 25 MG tablet Take 1 tablet (25 mg total) by mouth 2 (two) times daily.  60 tablet  2  . Nutritional Supplements (FEEDING SUPPLEMENT, OSMOLITE 1.5 CAL,) LIQD Place 237 mLs into feeding tube 2 (two) times daily after a meal. Breakfast and lunch.    0  . OLANZapine (ZYPREXA) 2.5 MG tablet Take 1 tablet (2.5 mg total) by  mouth at bedtime. To help with sleep/anxiety.  30 tablet  1  . ondansetron (ZOFRAN ODT) 4 MG disintegrating tablet Take 1 tablet (4 mg total) by mouth every 8 (eight) hours as needed for nausea.  20 tablet  0  . potassium chloride (K-DUR) 10 MEQ tablet Take 2 tablets (20 mEq total) by mouth daily.  120 tablet  1  . saccharomyces boulardii (FLORASTOR) 250 MG capsule Take 1 capsule (250 mg total) by mouth 2 (two) times daily.  60 capsule  1  . sodium chloride 0.9 % injection 10-40 mLs by Intracatheter route as needed (flush).  5 mL  3  . spironolactone (ALDACTONE) 25 MG tablet Take 1 tablet (25 mg total) by mouth daily. diuretic  30 tablet  1  . warfarin (COUMADIN) 3 MG tablet Take as directed by coumadin clinic  50 tablet  3  . Water For Irrigation, Sterile (FREE WATER) SOLN Place 160 mLs into feeding tube 4 (four) times daily - after meals and at bedtime.       No current facility-administered medications for this visit.    OBJECTIVE: Middle-aged white woman in no acute distress Filed Vitals:   10/29/12 0945  BP: 130/76  Pulse: 66  Temp: 98.2 F (36.8 C)  Resp: 20     Body mass index is 26.75 kg/(m^2).    ECOG FS: 1 Filed Weights   10/29/12 0945  Weight: 155 lb 14.4 oz (70.716 kg)   Sclerae unicteric Oropharynx clear No cervical or supraclavicular adenopathy Lungs no rales or rhonchi Heart regular rate and rhythm Abd soft, nontender, positive bowel sounds, PEG in place, intact with no erythema, swelling, or discharge MSK no focal spinal tenderness, no peripheral edema Neuro: nonfocal, well oriented, appropriate affect Breasts: The right breast is status post mastectomy. There is no evidence of local recurrence. The right axilla is benign. The left breast is unremarkable.     LAB RESULTS: Lab Results  Component Value Date   WBC 6.6 10/21/2012   NEUTROABS 4.8 10/21/2012   HGB 10.6* 10/21/2012  HCT 33.0* 10/21/2012   MCV 90.3 10/21/2012   PLT 105.0* 10/21/2012      Chemistry       Component Value Date/Time   NA 136 10/15/2012 1151   NA 146* 07/28/2012 1145   K 4.1 10/15/2012 1151   K 3.4* 07/28/2012 1145   CL 103 10/15/2012 1151   CL 108* 07/28/2012 1145   CO2 28 10/15/2012 1151   CO2 29 07/28/2012 1145   BUN 12 10/15/2012 1151   BUN 5.0* 07/28/2012 1145   CREATININE 0.6 10/15/2012 1151   CREATININE 0.9 07/28/2012 1145   CREATININE 1.30* 07/23/2012 1342      Component Value Date/Time   CALCIUM 8.8 10/15/2012 1151   CALCIUM 8.2* 07/28/2012 1145   ALKPHOS 149* 10/15/2012 1151   ALKPHOS 141 07/28/2012 1145   AST 64* 10/15/2012 1151   AST 72* 07/28/2012 1145   ALT 39* 10/15/2012 1151   ALT 25 07/28/2012 1145   BILITOT 0.6 10/15/2012 1151   BILITOT 0.58 07/28/2012 1145       Lab Results  Component Value Date   LABCA2 49* 06/04/2012    STUDIES: No results found.   ASSESSMENT: 57 y.o. [name] woman   (1)  status post right mastectomy under the care of Dr. Johna Sheriff on 06/08/2012 for a pT3, pN1a, stage IIIA invasive ductal carcinoma, grade 2,  estrogen and progesterone receptor positive at 100%, HER-2/neu negative, with MIB-1 of 20%.    (2)  treated in the adjuvant setting with docetaxel/cyclophosphamide given every 3 weeks. There were multiple and severe complications, and the  patient tolerated only two cycles, last dose 07/29/2012  (3) letrozole started 08/16/2012  (3) postmastectomy radiation scheduled to start next week  (4)  comorbidities include diabetes, hypertension, chronic liver disease with cirrhosis and fatty liver, and chronic nausea, with nutrition dependent on PEG feeds.   PLAN: Keyonni is doing terrific, with a much improved functional status. She occasionally tries to eat something but most of the time food just repeat rales her. She is tolerating the PEG feedings fine and her weight is stable. Her diarrhea has resolved. She is now ready to start her radiation treatments.  As far as the antiestrogen therapy is concerned, the plan is to continue letrozole  for 5 years. She will need a bone density scan sometime later this year, but at this point I am just delighted that she is tolerating the letrozole well and she is able to keep her weight stable. They know to call for any problems that may develop before the next visit.  MAGRINAT,GUSTAV C    10/29/2012

## 2012-11-05 ENCOUNTER — Ambulatory Visit
Admission: RE | Admit: 2012-11-05 | Discharge: 2012-11-05 | Disposition: A | Discharge status: Home / Self Care | Payer: 59 | Source: Ambulatory Visit | Attending: Radiation Oncology | Admitting: Radiation Oncology

## 2012-11-05 ENCOUNTER — Encounter: Payer: Self-pay | Admitting: Nurse Practitioner

## 2012-11-05 ENCOUNTER — Ambulatory Visit (INDEPENDENT_AMBULATORY_CARE_PROVIDER_SITE_OTHER): Payer: 59

## 2012-11-05 ENCOUNTER — Ambulatory Visit (INDEPENDENT_AMBULATORY_CARE_PROVIDER_SITE_OTHER): Payer: 59 | Admitting: Nurse Practitioner

## 2012-11-05 VITALS — BP 118/60 | HR 60 | Ht 64.0 in | Wt 158.8 lb

## 2012-11-05 DIAGNOSIS — C50911 Malignant neoplasm of unspecified site of right female breast: Secondary | ICD-10-CM

## 2012-11-05 DIAGNOSIS — Z79899 Other long term (current) drug therapy: Secondary | ICD-10-CM | POA: Insufficient documentation

## 2012-11-05 DIAGNOSIS — Z7901 Long term (current) use of anticoagulants: Secondary | ICD-10-CM | POA: Insufficient documentation

## 2012-11-05 DIAGNOSIS — L988 Other specified disorders of the skin and subcutaneous tissue: Secondary | ICD-10-CM | POA: Insufficient documentation

## 2012-11-05 DIAGNOSIS — Z510 Encounter for antineoplastic radiation therapy: Secondary | ICD-10-CM | POA: Insufficient documentation

## 2012-11-05 DIAGNOSIS — Z86718 Personal history of other venous thrombosis and embolism: Secondary | ICD-10-CM

## 2012-11-05 DIAGNOSIS — Y842 Radiological procedure and radiotherapy as the cause of abnormal reaction of the patient, or of later complication, without mention of misadventure at the time of the procedure: Secondary | ICD-10-CM | POA: Insufficient documentation

## 2012-11-05 DIAGNOSIS — R209 Unspecified disturbances of skin sensation: Secondary | ICD-10-CM | POA: Insufficient documentation

## 2012-11-05 DIAGNOSIS — I4891 Unspecified atrial fibrillation: Secondary | ICD-10-CM

## 2012-11-05 DIAGNOSIS — C50919 Malignant neoplasm of unspecified site of unspecified female breast: Secondary | ICD-10-CM | POA: Insufficient documentation

## 2012-11-05 LAB — POCT INR: INR: 1.6

## 2012-11-05 MED ORDER — WARFARIN SODIUM 3 MG PO TABS: ORAL_TABLET | ORAL | Status: DC

## 2012-11-05 NOTE — Patient Instructions (Addendum)
Stay on your current medicines  I think you are doing well  See Dr. Elease Hashimoto in 3 months  Call the [facility] office at [phone] if you have any questions, problems or concerns.

## 2012-11-05 NOTE — Progress Notes (Signed)
[name] Date of Birth: 1955/10/26 Medical Record #[id number]  History of Present Illness: [name] is seen back today for a 3 week check. Seen for Dr. Elease Hashimoto. Has HTn, chronic leg edema, prior DVT on coumadin in the past, hypothyroidism, DM, OSA, GERD, chronic liver disease with cirrhosis, breast cancer - with intolerance to chemo, atrial fib and diastolic heart failure.   Had a recent lengthy hospitalization for failure to thrive. Had breast cancer diagnosed in January. Did not tolerate her chemo. Has had progressive weakness. Has a PEG in place due to altered taste. Had issues with severely depressed mood and required psyche evaluation. Was on amiodarone for her AF but this was stopped. TSH was quite elevated as well. Had volume overload and was gently diuresed and required fluid restriction. Felt to have diastolic heart failure. She remains on coumadin.   I saw her 3 weeks ago. She was doing better. Was in sinus rhythm. Still had her PEG in place and was still having significant altered taste.   Comes back today. Here with her husband. Doing better. Actually eating some. Taste is improving. Not short of breath. No chest pain. Not dizzy or lightheaded. No palpitations. To have her simulation for radiation today.   Current Outpatient Prescriptions  Medication Sig Dispense Refill  . Alum & Mag Hydroxide-Simeth (MAGIC MOUTHWASH W/LIDOCAINE) SOLN Take 5 mLs by mouth as needed.      [name] antiseptic oral rinse (BIOTENE) LIQD 15 mLs by Mouth Rinse route 2 (two) times daily as needed.  100 mL  3  . buPROPion (WELLBUTRIN SR) 150 MG 12 hr tablet Take 150 mg by mouth every morning.       . cholestyramine (QUESTRAN) 4 G packet Take 1 packet (4 g total) by mouth daily as needed. For diarrhea.  60 each  3  . clonazePAM (KLONOPIN) 0.5 MG tablet Take 0.5 tablets (0.25 mg total) by mouth 2 (two) times daily. For anxiety  60 tablet  2  . escitalopram (LEXAPRO) 10 MG tablet Take 1 tablet (10 mg total) by  mouth at bedtime.  31 tablet  0  . furosemide (LASIX) 80 MG tablet Take 0.5 tablets (40 mg total) by mouth daily.  15 tablet  2  . letrozole (FEMARA) 2.5 MG tablet Take 1 tablet (2.5 mg total) by mouth daily.  30 tablet  3  . levothyroxine (SYNTHROID, LEVOTHROID) 125 MCG tablet Take 125 mcg by mouth daily before breakfast.      . lidocaine-prilocaine (EMLA) cream Apply 1 application topically as needed (for port access).      [name] loperamide (IMODIUM) 2 MG capsule Take 1 capsule (2 mg total) by mouth 3 (three) times daily before meals.  30 capsule  0  . metoprolol tartrate (LOPRESSOR) 25 MG tablet Take 1 tablet (25 mg total) by mouth 2 (two) times daily.  60 tablet  2  . Nutritional Supplements (FEEDING SUPPLEMENT, OSMOLITE 1.5 CAL,) LIQD Place 237 mLs into feeding tube 2 (two) times daily after a meal. Breakfast and lunch.    0  . OLANZapine (ZYPREXA) 2.5 MG tablet Take 1 tablet (2.5 mg total) by mouth at bedtime. To help with sleep/anxiety.  30 tablet  1  . ondansetron (ZOFRAN ODT) 4 MG disintegrating tablet Take 1 tablet (4 mg total) by mouth every 8 (eight) hours as needed for nausea.  20 tablet  0  . potassium chloride (K-DUR) 10 MEQ tablet Take 2 tablets (20 mEq total) by mouth daily.  120  tablet  1  . saccharomyces boulardii (FLORASTOR) 250 MG capsule Take 1 capsule (250 mg total) by mouth 2 (two) times daily.  60 capsule  1  . sodium chloride 0.9 % injection 10-40 mLs by Intracatheter route as needed (flush).  5 mL  3  . spironolactone (ALDACTONE) 25 MG tablet Take 1 tablet (25 mg total) by mouth daily. diuretic  30 tablet  1  . warfarin (COUMADIN) 3 MG tablet Take as directed by coumadin clinic  50 tablet  3  . Water For Irrigation, Sterile (FREE WATER) SOLN Place 160 mLs into feeding tube 4 (four) times daily - after meals and at bedtime.       No current facility-administered medications for this visit.    Allergies  Allergen Reactions  . Olmesartan Medoxomil Cough    REACTION: ? if  cough  . Tetracycline Hcl     Unknown reaction, too long for patient to remember   . Venlafaxine     REACTION: severe dry moouth  . Adhesive (Tape) Rash    Past Medical History  Diagnosis Date  . Depression   . DVT (deep venous thrombosis)     hx of on HRT left leg ~2006  . GERD (gastroesophageal reflux disease)   . Hyperlipidemia   . Hypertension   . Hypothyroidism   . PPD positive, treated     rx inh   . OSA on CPAP   . Diabetes mellitus   . 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 Breast cancer     a. 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 DJD (degenerative joint disease) of lumbar spine   . Chronic diastolic CHF (congestive heart failure)     a. 08/2012 Echo: EF 55-60%, no rwma, mod MR.  [name] A-fib     a. on amio for rate control/coumadin.  [name] Physical deconditioning   . Allergy   . Anemia     low iron hx  . Blood transfusion without reported diagnosis     2 u prbc  . Cataract     removed ou    Past Surgical History  Procedure Laterality Date  . Tubal ligation    . Cholecystectomy    . Foot surgery    . Eye surgery    . Cataract extraction    . Percutaneous liver biopsy    . Breast biopsy      left breast  . Mastectomy modified radical  06/08/2012    Procedure: MASTECTOMY MODIFIED RADICAL;  Surgeon: Mariella Saa, MD;  Location: [facility] OR;  Service: General;  Laterality: Right;  . Portacath placement  06/08/2012    Procedure: INSERTION PORT-A-CATH;  Surgeon: Mariella Saa, MD;  Location: [facility] OR;  Service: General;  Laterality: Left;  . Peg tube placement      History  Smoking status  . Former Smoker -- 1.00 packs/day for 1 years  Smokeless tobacco  . Never Used    History  Alcohol Use No    Family History  Problem Relation Age of Onset  . Diabetes Mother   . Hypertension Mother   . Arthritis Mother   . Heart  disease Mother   . Heart failure Mother   . Other Mother     benign breast mass  . Stroke Father   . Heart disease Father   . Diabetes Paternal  Grandmother   . Colon cancer Paternal Grandfather     Review of Systems: The review of systems is per the HPI.  All other systems were reviewed and are negative.  Physical Exam: BP 118/60  Pulse 60  Ht 5\' 4"  (1.626 m)  Wt 158 lb 12.8 oz (72.031 kg)  BMI 27.24 kg/m2 Patient is very pleasant and in no acute distress. She still looks chronically ill but stronger today.  Color still sallow. Skin is warm and dry. HEENT is unremarkable. Normocephalic/atraumatic. PERRL. Sclera are nonicteric. Neck is supple. No masses. No JVD. Lungs are clear. Cardiac exam shows a regular rate and rhythm. Abdomen is soft. Extremities are without edema. Gait and ROM are intact. No gross neurologic deficits noted.  LABORATORY DATA:  Lab Results  Component Value Date   WBC 6.3 10/29/2012   HGB 11.7 10/29/2012   HCT 35.4 10/29/2012   PLT 163 10/29/2012   GLUCOSE 67* 10/29/2012   CHOL 168 10/14/2011   TRIG 93.0 10/14/2011   HDL 48.70 10/14/2011   LDLDIRECT 160.2 09/23/2010   LDLCALC 101* 10/14/2011   ALT 27 10/29/2012   AST 45* 10/29/2012   NA 139 10/29/2012   K 4.0 10/29/2012   CL 104 10/29/2012   CREATININE 0.6 10/29/2012   BUN 11.6 10/29/2012   CO2 26 10/29/2012   TSH 3.01 10/15/2012   INR 1.6 11/05/2012   HGBA1C 6.1* 08/24/2012   MICROALBUR 0.92 05/03/2012     Assessment / Plan: 1. PAF - off of amiodarone - remains on her coumadin - in sinus by exam today.   2. Diastolic heart failure - looks compensated.   3. Breast cancer - followed by oncology and radiation oncology - to start radiation.   4. Cirrhosis - followed by primary care  5. Chronic coumadin - she would like her PCP to manage due to cost.  Overall, I think she looks better. We will see her back in 3 months. Her husband is needing documentation of her condition to try and get some help for their mortgage  - I have given him my last OV note.   Patient is agreeable to this plan and will call if any problems develop in the interim.   Rosalio Macadamia, RN, ANP-C [facility] [address] [place], [place]  [address]

## 2012-11-05 NOTE — Progress Notes (Addendum)
Name: [name]   MRN: [id number]  Date:  11/05/2012 DOB: 22-Oct-1955  Status:outpatient    DIAGNOSIS: Breast cancer.  CONSENT VERIFIED: yes   SET UP: Patient is setup supine   IMMOBILIZATION:  The following immobilization was used:Custom Moldable Pillow, breast board.   NARRATIVE: [name] was brought to the CT Simulation planning suite.  Identity was confirmed.  All relevant records and images related to the planned course of therapy were reviewed.  Then, the patient was positioned in a stable reproducible clinical set-up for radiation therapy.  Wires were placed to delineate the clinical extent of breast tissue. A wire was placed on the scar as well.  CT images were obtained.  An isocenter was placed. Skin markings were placed.  The CT images were loaded into the planning software where the target and avoidance structures were contoured.  The radiation prescription was entered and confirmed. The patient was discharged in stable condition and tolerated simulation well.    TREATMENT PLANNING NOTE:  Treatment planning then occurred. I have requested : MLC's, isodose plan, basic dose calculation  I personally designed and supervised the construction of 4 medically necessary complex treatment devices for the protection of critical normal structures including the lungs and contralateral breast as well as the immobilization device which is necessary for set up certainty.

## 2012-11-08 ENCOUNTER — Telehealth: Payer: Self-pay | Admitting: Family

## 2012-11-08 ENCOUNTER — Encounter (HOSPITAL_COMMUNITY): Payer: Self-pay | Admitting: *Deleted

## 2012-11-08 ENCOUNTER — Emergency Department (HOSPITAL_COMMUNITY)
Admission: EM | Admit: 2012-11-08 | Discharge: 2012-11-08 | Disposition: A | Discharge status: Home / Self Care | Payer: 59 | Attending: Emergency Medicine | Admitting: Emergency Medicine

## 2012-11-08 DIAGNOSIS — Z86718 Personal history of other venous thrombosis and embolism: Secondary | ICD-10-CM | POA: Insufficient documentation

## 2012-11-08 DIAGNOSIS — G4733 Obstructive sleep apnea (adult) (pediatric): Secondary | ICD-10-CM | POA: Insufficient documentation

## 2012-11-08 DIAGNOSIS — Z8719 Personal history of other diseases of the digestive system: Secondary | ICD-10-CM | POA: Insufficient documentation

## 2012-11-08 DIAGNOSIS — I5032 Chronic diastolic (congestive) heart failure: Secondary | ICD-10-CM | POA: Insufficient documentation

## 2012-11-08 DIAGNOSIS — I4891 Unspecified atrial fibrillation: Secondary | ICD-10-CM | POA: Insufficient documentation

## 2012-11-08 DIAGNOSIS — K219 Gastro-esophageal reflux disease without esophagitis: Secondary | ICD-10-CM | POA: Insufficient documentation

## 2012-11-08 DIAGNOSIS — Z79899 Other long term (current) drug therapy: Secondary | ICD-10-CM | POA: Insufficient documentation

## 2012-11-08 DIAGNOSIS — K9422 Gastrostomy infection: Secondary | ICD-10-CM | POA: Insufficient documentation

## 2012-11-08 DIAGNOSIS — L02818 Cutaneous abscess of other sites: Secondary | ICD-10-CM | POA: Insufficient documentation

## 2012-11-08 DIAGNOSIS — Z87891 Personal history of nicotine dependence: Secondary | ICD-10-CM | POA: Insufficient documentation

## 2012-11-08 DIAGNOSIS — Z862 Personal history of diseases of the blood and blood-forming organs and certain disorders involving the immune mechanism: Secondary | ICD-10-CM | POA: Insufficient documentation

## 2012-11-08 DIAGNOSIS — Z8669 Personal history of other diseases of the nervous system and sense organs: Secondary | ICD-10-CM | POA: Insufficient documentation

## 2012-11-08 DIAGNOSIS — Z8739 Personal history of other diseases of the musculoskeletal system and connective tissue: Secondary | ICD-10-CM | POA: Insufficient documentation

## 2012-11-08 DIAGNOSIS — F3289 Other specified depressive episodes: Secondary | ICD-10-CM | POA: Insufficient documentation

## 2012-11-08 DIAGNOSIS — E119 Type 2 diabetes mellitus without complications: Secondary | ICD-10-CM | POA: Insufficient documentation

## 2012-11-08 DIAGNOSIS — F329 Major depressive disorder, single episode, unspecified: Secondary | ICD-10-CM | POA: Insufficient documentation

## 2012-11-08 DIAGNOSIS — R21 Rash and other nonspecific skin eruption: Secondary | ICD-10-CM | POA: Insufficient documentation

## 2012-11-08 DIAGNOSIS — I10 Essential (primary) hypertension: Secondary | ICD-10-CM | POA: Insufficient documentation

## 2012-11-08 DIAGNOSIS — E039 Hypothyroidism, unspecified: Secondary | ICD-10-CM | POA: Insufficient documentation

## 2012-11-08 DIAGNOSIS — Z8639 Personal history of other endocrine, nutritional and metabolic disease: Secondary | ICD-10-CM | POA: Insufficient documentation

## 2012-11-08 DIAGNOSIS — L03818 Cellulitis of other sites: Secondary | ICD-10-CM | POA: Insufficient documentation

## 2012-11-08 DIAGNOSIS — Z853 Personal history of malignant neoplasm of breast: Secondary | ICD-10-CM | POA: Insufficient documentation

## 2012-11-08 DIAGNOSIS — Z7901 Long term (current) use of anticoagulants: Secondary | ICD-10-CM | POA: Insufficient documentation

## 2012-11-08 MED ORDER — CLINDAMYCIN HCL 300 MG PO CAPS
300.0000 mg | ORAL_CAPSULE | Freq: Once | ORAL | Status: AC
  Administered 2012-11-08: 300 mg via ORAL
  Filled 2012-11-08: qty 1

## 2012-11-08 MED ORDER — CLINDAMYCIN HCL 300 MG PO CAPS: 300.0000 mg | ORAL_CAPSULE | Freq: Four times a day (QID) | ORAL | Status: DC

## 2012-11-08 NOTE — ED Notes (Signed)
Pt has redden area around the lower part of her feeding tube that started yesterday. There is dark reddish-brown discharge around feeding tube but denies any purulent drainage.pt denies pain at site and states that the feeding tube flushes adequately.

## 2012-11-08 NOTE — ED Notes (Addendum)
Pt reports hx of feeding tube, when she changed the bandage this morning she noticed reddness on skin around tube, suspects possible infection. Reports feeding tube normally has drainage since being placed several months ago. Denies pain.  Pt reports hx of breast cancer, feeding tube place because pt could not eat during chemo. Finished with chemo. Starts radiation on July 7. Right arm restriction.

## 2012-11-08 NOTE — Telephone Encounter (Signed)
Pls arrange ED follow up on Wednesday.

## 2012-11-08 NOTE — ED Provider Notes (Signed)
History    CSN: [id number] Arrival date & time 11/08/12  0908  First MD Initiated Contact with Patient 11/08/12 [phone]     Chief Complaint  Patient presents with  . cancer pt, possible feeding tube infection    (Consider location/radiation/quality/duration/timing/severity/associated sxs/prior Treatment) HPI Pt with G-tube in place for several months noticed small amount of surrounding redness at the site today. No tenderness, fever, chills, nausea. Pt continues to have small amount of drainage since insertion without any recent changes. Flushes well.  Past Medical History  Diagnosis Date  . Depression   . DVT (deep venous thrombosis)     hx of on HRT left leg ~2006  . GERD (gastroesophageal reflux disease)   . Hyperlipidemia   . Hypertension   . Hypothyroidism   . PPD positive, treated     rx inh   . OSA on CPAP   . Diabetes mellitus   . 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 Breast cancer     a. 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 DJD (degenerative joint disease) of lumbar spine   . Chronic diastolic CHF (congestive heart failure)     a. 08/2012 Echo: EF 55-60%, no rwma, mod MR.  [name] A-fib     a. on amio for rate control/coumadin.  [name] Physical deconditioning   . Allergy   . Anemia     low iron hx  . Blood transfusion without reported diagnosis     2 u prbc  . Cataract     removed ou   Past Surgical History  Procedure Laterality Date  . Tubal ligation    . Cholecystectomy    . Foot surgery    . Eye surgery    . Cataract extraction    . Percutaneous liver biopsy    . Breast biopsy      left breast  . Mastectomy modified radical  06/08/2012    Procedure: MASTECTOMY MODIFIED RADICAL;  Surgeon: Mariella Saa, MD;  Location: [facility] OR;  Service: General;  Laterality: Right;  . Portacath placement  06/08/2012    Procedure: INSERTION  PORT-A-CATH;  Surgeon: Mariella Saa, MD;  Location: [facility] OR;  Service: General;  Laterality: Left;  . Peg tube placement     Family History  Problem Relation Age of Onset  . Diabetes Mother   . Hypertension Mother   . Arthritis Mother   . Heart disease Mother   . Heart failure Mother   . Other Mother     benign breast mass  . Stroke Father   . Heart disease Father   . Diabetes Paternal Grandmother   . Colon cancer Paternal Grandfather    History  Substance Use Topics  . Smoking status: Former Smoker -- 1.00 packs/day for 1 years  . Smokeless tobacco: Never Used  . Alcohol Use: No   OB History   Grav Para Term Preterm Abortions TAB SAB Ect Mult Living   2 2             Review of Systems  Constitutional: Negative for fever and chills.  Respiratory: Negative for shortness of breath.   Gastrointestinal: Negative for nausea, vomiting and abdominal pain.  Skin: Positive for color change and rash. Negative for wound.  All other systems reviewed and are negative.    Allergies  Olmesartan medoxomil; Tetracycline hcl; Venlafaxine;  and Adhesive  Home Medications   Current Outpatient Rx  Name  Route  Sig  Dispense  Refill  . Alum & Mag Hydroxide-Simeth (MAGIC MOUTHWASH W/LIDOCAINE) SOLN   Oral   Take 5 mLs by mouth as needed.         [name] antiseptic oral rinse (BIOTENE) LIQD   Mouth Rinse   15 mLs by Mouth Rinse route 2 (two) times daily as needed.   100 mL   3   . buPROPion (WELLBUTRIN SR) 150 MG 12 hr tablet   Oral   Take 150 mg by mouth every morning.          . cholestyramine (QUESTRAN) 4 G packet   Oral   Take 1 packet (4 g total) by mouth daily as needed. For diarrhea.   60 each   3   . clindamycin (CLEOCIN) 300 MG capsule   Oral   Take 1 capsule (300 mg total) by mouth 4 (four) times daily. X 7 days   28 capsule   0   . clonazePAM (KLONOPIN) 0.5 MG tablet   Oral   Take 0.5 tablets (0.25 mg total) by mouth 2 (two) times daily. For anxiety   60  tablet   2   . escitalopram (LEXAPRO) 10 MG tablet   Oral   Take 1 tablet (10 mg total) by mouth at bedtime.   31 tablet   0   . furosemide (LASIX) 80 MG tablet   Oral   Take 0.5 tablets (40 mg total) by mouth daily.   15 tablet   2   . letrozole (FEMARA) 2.5 MG tablet   Oral   Take 1 tablet (2.5 mg total) by mouth daily.   30 tablet   3   . levothyroxine (SYNTHROID, LEVOTHROID) 125 MCG tablet   Oral   Take 125 mcg by mouth daily before breakfast.         . lidocaine-prilocaine (EMLA) cream   Topical   Apply 1 application topically as needed (for port access).         [name] loperamide (IMODIUM) 2 MG capsule   Oral   Take 1 capsule (2 mg total) by mouth 3 (three) times daily before meals.   30 capsule   0   . metoprolol tartrate (LOPRESSOR) 25 MG tablet   Oral   Take 1 tablet (25 mg total) by mouth 2 (two) times daily.   60 tablet   2   . Nutritional Supplements (FEEDING SUPPLEMENT, OSMOLITE 1.5 CAL,) LIQD   Per Tube   Place 237 mLs into feeding tube 2 (two) times daily after a meal. Breakfast and lunch.      0   . OLANZapine (ZYPREXA) 2.5 MG tablet   Oral   Take 1 tablet (2.5 mg total) by mouth at bedtime. To help with sleep/anxiety.   30 tablet   1   . ondansetron (ZOFRAN ODT) 4 MG disintegrating tablet   Oral   Take 1 tablet (4 mg total) by mouth every 8 (eight) hours as needed for nausea.   20 tablet   0   . potassium chloride (K-DUR) 10 MEQ tablet   Oral   Take 2 tablets (20 mEq total) by mouth daily.   120 tablet   1   . saccharomyces boulardii (FLORASTOR) 250 MG capsule   Oral   Take 1 capsule (250 mg total) by mouth 2 (two) times daily.   60 capsule   1   .  sodium chloride 0.9 % injection   Intracatheter   10-40 mLs by Intracatheter route as needed (flush).   5 mL   3   . spironolactone (ALDACTONE) 25 MG tablet   Oral   Take 1 tablet (25 mg total) by mouth daily. diuretic   30 tablet   1   . warfarin (COUMADIN) 3 MG tablet       Take as directed by coumadin clinic   50 tablet   3   . Water For Irrigation, Sterile (FREE WATER) SOLN   Per Tube   Place 160 mLs into feeding tube 4 (four) times daily - after meals and at bedtime.          BP 132/63  Pulse 64  Temp(Src) 98 F (36.7 C)  Resp 16  SpO2 99% Physical Exam  Nursing note and vitals reviewed. Constitutional: She is oriented to person, place, and time. She appears well-developed and well-nourished. No distress.  HENT:  Head: Normocephalic and atraumatic.  Mouth/Throat: Oropharynx is clear and moist.  Eyes: EOM are normal. Pupils are equal, round, and reactive to light.  Neck: Normal range of motion. Neck supple.  Cardiovascular: Normal rate and regular rhythm.   Pulmonary/Chest: Effort normal and breath sounds normal. No respiratory distress. She has no wheezes. She has no rales.  Abdominal: Soft. Bowel sounds are normal. She exhibits no distension and no mass. There is no tenderness. There is no rebound and no guarding.  G-tube in place. Roughly 1 cm of surrounding erythema. No tenderness or induration. Small amount of yellow discharge.   Musculoskeletal: Normal range of motion. She exhibits no edema and no tenderness.  Neurological: She is alert and oriented to person, place, and time.  Skin: Skin is warm and dry. No rash noted. No erythema.  Psychiatric: She has a normal mood and affect. Her behavior is normal.    ED Course  Procedures (including critical care time) Labs Reviewed - No data to display No results found. 1. Cellulitis at gastrostomy tube site     MDM  Probable early cellulitis. Will treat with abx. Advise PMD f/u and return precautions given.   Loren Racer, MD 11/08/12 [phone]

## 2012-11-08 NOTE — Telephone Encounter (Signed)
Appointment scheduled for Wednesday, July 2nd.

## 2012-11-10 ENCOUNTER — Ambulatory Visit (INDEPENDENT_AMBULATORY_CARE_PROVIDER_SITE_OTHER): Payer: 59 | Admitting: Family

## 2012-11-10 ENCOUNTER — Encounter: Payer: Self-pay | Admitting: Family

## 2012-11-10 VITALS — BP 118/60 | HR 63 | Temp 98.1°F | Resp 16 | Wt 161.1 lb

## 2012-11-10 DIAGNOSIS — L0390 Cellulitis, unspecified: Secondary | ICD-10-CM

## 2012-11-10 NOTE — Assessment & Plan Note (Signed)
Per ED notes erythema appears stable if not slightly improved. Dressing changed today in the office.  Recommended that pt continue clindamycin and be re-evaluated in the office early next week. She is instructed to call if increased erythema, drainage, odor, or fever occurs.  Pt verbalizes understanding.

## 2012-11-10 NOTE — Patient Instructions (Addendum)
Please continue clindamycin. Call if increased redness/drainage. Follow up on 7/7 with Dr. Abner Greenspan.

## 2012-11-10 NOTE — Progress Notes (Signed)
Subjective:    Patient ID: [name], female    DOB: 05-05-1956, 57 y.o.   MRN: [id number]  HPI  [name] is a 57 yr old female with hx of breast cancer who presents today for ED follow up. Records are reviewed. The pt was evaluated on 6/30 and was diagnosed with cellulitis surrounding her gastrostomy tube.  She was started on clindamycin. She reports that she is "not sure that it is any better."  Reports tolerating clindamycin without adverse effects. 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  She is scheduled to start her radiation treatment next week.    Review of Systems See HPI  Past Medical History  Diagnosis Date  . Depression   . DVT (deep venous thrombosis)     hx of on HRT left leg ~2006  . GERD (gastroesophageal reflux disease)   . Hyperlipidemia   . Hypertension   . Hypothyroidism   . PPD positive, treated     rx inh   . OSA on CPAP   . Diabetes mellitus   . 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 Breast cancer     a. 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 DJD (degenerative joint disease) of lumbar spine   . Chronic diastolic CHF (congestive heart failure)     a. 08/2012 Echo: EF 55-60%, no rwma, mod MR.  [name] A-fib     a. on amio for rate control/coumadin.  [name] Physical deconditioning   . Allergy   . Anemia     low iron hx  . Blood transfusion without reported diagnosis     2 u prbc  . Cataract     removed ou    History   Social History  . Marital Status: Married    Spouse Name: N/A    Number of Children: 2  . Years of Education: N/A   Occupational History  . SECRETARY [employer]   Social History Main Topics  . Smoking status: Former Smoker -- 1.00 packs/day for 1 years  . Smokeless tobacco: Never Used  . Alcohol Use: No  . Drug Use: No     Comment: quit age  79 only smoked as a teen 1 year  . Sexually Active: No   Other Topics Concern  . 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     Past Surgical History  Procedure Laterality Date  . Tubal ligation    . Cholecystectomy    . Foot surgery    . Eye surgery    . Cataract extraction    . Percutaneous liver biopsy    . Breast biopsy      left breast  . Mastectomy modified radical  06/08/2012    Procedure: MASTECTOMY MODIFIED RADICAL;  Surgeon: Mariella Saa, MD;  Location: [facility] OR;  Service: General;  Laterality: Right;  . Portacath placement  06/08/2012    Procedure: INSERTION PORT-A-CATH;  Surgeon: Mariella Saa, MD;  Location: [facility] OR;  Service: General;  Laterality: Left;  . Peg tube  placement      Family History  Problem Relation Age of Onset  . Diabetes Mother   . Hypertension Mother   . Arthritis Mother   . Heart disease Mother   . Heart failure Mother   . Other Mother     benign breast mass  . Stroke Father   . Heart disease Father   . Diabetes Paternal Grandmother   . Colon cancer Paternal Grandfather     Allergies  Allergen Reactions  . Olmesartan Medoxomil Cough    REACTION: ? if cough  . Tetracycline Hcl     Unknown reaction, too long for patient to remember   . Venlafaxine     REACTION: severe dry moouth  . Adhesive (Tape) Rash    Current Outpatient Prescriptions on File Prior to Visit  Medication Sig Dispense Refill  . Alum & Mag Hydroxide-Simeth (MAGIC MOUTHWASH W/LIDOCAINE) SOLN Take 5 mLs by mouth as needed.      [name] antiseptic oral rinse (BIOTENE) LIQD 15 mLs by Mouth Rinse route 2 (two) times daily as needed.  100 mL  3  . buPROPion (WELLBUTRIN SR) 150 MG 12 hr tablet Take 150 mg by mouth every morning.       . cholestyramine (QUESTRAN) 4 G packet Take 1 packet (4 g  total) by mouth daily as needed. For diarrhea.  60 each  3  . clindamycin (CLEOCIN) 300 MG capsule Take 1 capsule (300 mg total) by mouth 4 (four) times daily. X 7 days  28 capsule  0  . clonazePAM (KLONOPIN) 0.5 MG tablet Take 0.5 tablets (0.25 mg total) by mouth 2 (two) times daily. For anxiety  60 tablet  2  . escitalopram (LEXAPRO) 10 MG tablet Take 1 tablet (10 mg total) by mouth at bedtime.  31 tablet  0  . furosemide (LASIX) 80 MG tablet Take 40 mg by mouth 2 (two) times daily.      [name] letrozole (FEMARA) 2.5 MG tablet Take 1 tablet (2.5 mg total) by mouth daily.  30 tablet  3  . levothyroxine (SYNTHROID, LEVOTHROID) 125 MCG tablet Take 125 mcg by mouth daily before breakfast.      . lidocaine-prilocaine (EMLA) cream Apply 1 application topically as needed (for port access).      [name] loperamide (IMODIUM) 2 MG capsule Take 1 capsule (2 mg total) by mouth 3 (three) times daily before meals.  30 capsule  0  . metoprolol tartrate (LOPRESSOR) 25 MG tablet Take 1 tablet (25 mg total) by mouth 2 (two) times daily.  60 tablet  2  . Nutritional Supplements (FEEDING SUPPLEMENT, OSMOLITE 1.5 CAL,) LIQD Place 237 mLs into feeding tube 2 (two) times daily after a meal. Breakfast and lunch.    0  . OLANZapine (ZYPREXA) 2.5 MG tablet Take 1 tablet (2.5 mg total) by mouth at bedtime. To help with sleep/anxiety.  30 tablet  1  . ondansetron (ZOFRAN ODT) 4 MG disintegrating tablet Take 1 tablet (4 mg total) by mouth every 8 (eight) hours as needed for nausea.  20 tablet  0  . potassium chloride (K-DUR) 10 MEQ tablet Take 10 mEq by mouth 2 (two) times daily.      [name] saccharomyces boulardii (FLORASTOR) 250 MG capsule Take 1 capsule (250 mg total) by mouth 2 (two) times daily.  60 capsule  1  . sodium chloride 0.9 % injection 10-40 mLs by Intracatheter route as needed (flush).  5 mL  3  . spironolactone (ALDACTONE) 25 MG  tablet Take 1 tablet (25 mg total) by mouth daily. diuretic  30 tablet  1  . warfarin (COUMADIN) 3  MG tablet Take 6-9 mg by mouth daily. 9 mg on Monday and Friday & 6 mg on all other days.      . Water For Irrigation, Sterile (FREE WATER) SOLN Place 160 mLs into feeding tube 4 (four) times daily - after meals and at bedtime.       No current facility-administered medications on file prior to visit.    BP 118/60  Pulse 63  Temp(Src) 98.1 F (36.7 C) (Oral)  Resp 16  Wt 161 lb 1.9 oz (73.084 kg)  BMI 27.64 kg/m2  SpO2 99%       Objective:   Physical Exam  Constitutional: She is oriented to person, place, and time. She appears well-developed and well-nourished. No distress.  Cardiovascular: Normal rate and regular rhythm.   No murmur heard. Pulmonary/Chest: Effort normal and breath sounds normal. No respiratory distress. She has no wheezes. She has no rales. She exhibits no tenderness.  Neurological: She is alert and oriented to person, place, and time.  Psychiatric: She has a normal mood and affect. Her behavior is normal. Judgment and thought content normal.  skin:  Erythema surrounding gastrostomy tube.  Erythema does not extend beyond the plastic disc that is flush against her skin. A small amount of yellow drainage is noted on the gauze dressing.  No odor.         Assessment & Plan:

## 2012-11-11 ENCOUNTER — Ambulatory Visit (INDEPENDENT_AMBULATORY_CARE_PROVIDER_SITE_OTHER): Payer: 59 | Admitting: *Deleted

## 2012-11-11 DIAGNOSIS — Z86718 Personal history of other venous thrombosis and embolism: Secondary | ICD-10-CM

## 2012-11-11 DIAGNOSIS — I4891 Unspecified atrial fibrillation: Secondary | ICD-10-CM

## 2012-11-15 ENCOUNTER — Ambulatory Visit
Admission: RE | Admit: 2012-11-15 | Discharge: 2012-11-15 | Disposition: A | Discharge status: Home / Self Care | Payer: 59 | Source: Ambulatory Visit | Attending: Radiation Oncology | Admitting: Radiation Oncology

## 2012-11-15 ENCOUNTER — Ambulatory Visit (INDEPENDENT_AMBULATORY_CARE_PROVIDER_SITE_OTHER): Payer: 59 | Admitting: Internal Medicine

## 2012-11-15 ENCOUNTER — Encounter: Payer: Self-pay | Admitting: Internal Medicine

## 2012-11-15 VITALS — BP 128/62 | HR 79 | Temp 98.2°F | Resp 16 | Wt 167.6 lb

## 2012-11-15 DIAGNOSIS — Z931 Gastrostomy status: Secondary | ICD-10-CM

## 2012-11-15 DIAGNOSIS — Z9889 Other specified postprocedural states: Secondary | ICD-10-CM

## 2012-11-15 DIAGNOSIS — C50911 Malignant neoplasm of unspecified site of right female breast: Secondary | ICD-10-CM

## 2012-11-15 DIAGNOSIS — L988 Other specified disorders of the skin and subcutaneous tissue: Secondary | ICD-10-CM

## 2012-11-15 NOTE — Progress Notes (Signed)
Subjective:    Patient ID: [name], female    DOB: [birth year], 57 y.o.   MRN: [id number]  HPI  Pt presents to the clinic today to recheck the area of cellulitis around her g-tube site. She was placed on clinidamycin. She was seen by Sandford Craze for the same on July 1. Plan was to continue the clindamycin and recheck in 1 week. Pt reports the redness has not improved. She has not received tube feeding in the past week. She would like to know when she can have it taken out.  Review of Systems      Past Medical History  Diagnosis Date  . Depression   . DVT (deep venous thrombosis)     hx of on HRT left leg ~2006  . GERD (gastroesophageal reflux disease)   . Hyperlipidemia   . Hypertension   . Hypothyroidism   . PPD positive, treated     rx inh   . OSA on CPAP   . Diabetes mellitus   . 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 Breast cancer     a. 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 DJD (degenerative joint disease) of lumbar spine   . Chronic diastolic CHF (congestive heart failure)     a. 08/2012 Echo: EF 55-60%, no rwma, mod MR.  [name] A-fib     a. on amio for rate control/coumadin.  [name] Physical deconditioning   . Allergy   . Anemia     low iron hx  . Blood transfusion without reported diagnosis     2 u prbc  . Cataract     removed ou    Current Outpatient Prescriptions  Medication Sig Dispense Refill  . Alum & Mag Hydroxide-Simeth (MAGIC MOUTHWASH W/LIDOCAINE) SOLN Take 5 mLs by mouth as needed.      [name] antiseptic oral rinse (BIOTENE) LIQD 15 mLs by Mouth Rinse route 2 (two) times daily as needed.  100 mL  3  . buPROPion (WELLBUTRIN SR) 150 MG 12 hr tablet Take 150 mg by mouth every morning.       . cholestyramine (QUESTRAN) 4 G packet Take 1 packet (4 g total) by mouth daily as needed. For diarrhea.  60 each  3  . clindamycin  (CLEOCIN) 300 MG capsule Take 1 capsule (300 mg total) by mouth 4 (four) times daily. X 7 days  28 capsule  0  . clonazePAM (KLONOPIN) 0.5 MG tablet Take 0.5 tablets (0.25 mg total) by mouth 2 (two) times daily. For anxiety  60 tablet  2  . escitalopram (LEXAPRO) 10 MG tablet Take 1 tablet (10 mg total) by mouth at bedtime.  31 tablet  0  . furosemide (LASIX) 80 MG tablet Take 40 mg by mouth 2 (two) times daily.      [name] letrozole (FEMARA) 2.5 MG tablet Take 1 tablet (2.5 mg total) by mouth daily.  30 tablet  3  . levothyroxine (SYNTHROID, LEVOTHROID) 125 MCG tablet Take 125 mcg by mouth daily before breakfast.      . lidocaine-prilocaine (EMLA) cream Apply 1 application topically as needed (for port access).      [name] loperamide (IMODIUM) 2 MG capsule Take 1 capsule (2 mg total) by mouth 3 (three) times daily before meals.  30 capsule  0  . metoprolol tartrate (LOPRESSOR) 25 MG tablet Take 1 tablet (  25 mg total) by mouth 2 (two) times daily.  60 tablet  2  . Nutritional Supplements (FEEDING SUPPLEMENT, OSMOLITE 1.5 CAL,) LIQD Place 237 mLs into feeding tube 2 (two) times daily after a meal. Breakfast and lunch.    0  . OLANZapine (ZYPREXA) 2.5 MG tablet Take 1 tablet (2.5 mg total) by mouth at bedtime. To help with sleep/anxiety.  30 tablet  1  . ondansetron (ZOFRAN ODT) 4 MG disintegrating tablet Take 1 tablet (4 mg total) by mouth every 8 (eight) hours as needed for nausea.  20 tablet  0  . potassium chloride (K-DUR) 10 MEQ tablet Take 10 mEq by mouth 2 (two) times daily.      [name] saccharomyces boulardii (FLORASTOR) 250 MG capsule Take 1 capsule (250 mg total) by mouth 2 (two) times daily.  60 capsule  1  . sodium chloride 0.9 % injection 10-40 mLs by Intracatheter route as needed (flush).  5 mL  3  . spironolactone (ALDACTONE) 25 MG tablet Take 1 tablet (25 mg total) by mouth daily. diuretic  30 tablet  1  . warfarin (COUMADIN) 3 MG tablet Take 6-9 mg by mouth daily. 9 mg on Monday and Friday & 6 mg on  all other days.      . Water For Irrigation, Sterile (FREE WATER) SOLN Place 160 mLs into feeding tube 4 (four) times daily - after meals and at bedtime.       No current facility-administered medications for this visit.    Allergies  Allergen Reactions  . Olmesartan Medoxomil Cough    REACTION: ? if cough  . Tetracycline Hcl     Unknown reaction, too long for patient to remember   . Venlafaxine     REACTION: severe dry moouth  . Adhesive (Tape) Rash    Family History  Problem Relation Age of Onset  . Diabetes Mother   . Hypertension Mother   . Arthritis Mother   . Heart disease Mother   . Heart failure Mother   . Other Mother     benign breast mass  . Stroke Father   . Heart disease Father   . Diabetes Paternal Grandmother   . Colon cancer Paternal Grandfather     History   Social History  . Marital Status: Married    Spouse Name: N/A    Number of Children: 2  . Years of Education: N/A   Occupational History  . SECRETARY [employer]   Social History Main Topics  . Smoking status: Former Smoker -- 1.00 packs/day for 1 years  . Smokeless tobacco: Never Used  . Alcohol Use: No  . Drug Use: No     Comment: quit age 65 only smoked as a teen 1 year  . Sexually Active: No   Other Topics Concern  . 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      Constitutional: Denies fever, malaise, fatigue, headache or abrupt weight changes. t.  Gastrointestinal: Denies abdominal pain, bloating, constipation, diarrhea or blood in the stool.  Skin: Pt reports cellulitis around g tube site. .    No other specific complaints in a complete review of systems (except as listed in HPI above).  Objective:  Physical Exam  BP 128/62  Pulse 79  Temp(Src) 98.2 F (36.8 C) (Oral)  Resp 16  Wt 167 lb  9.6 oz (76.023 kg)  BMI 28.75 kg/m2  SpO2 97% Wt Readings from Last 3 Encounters:  11/15/12 167 lb 9.6 oz (76.023 kg)  11/10/12 161 lb 1.9 oz (73.084 kg)  11/05/12 158 lb 12.8 oz (72.031 kg)    General: Appears her stated age, well developed, well nourished in NAD. Skin: Warm, dry and intact. Maceration noted around the g tube site. No evidence of cellulitis. Cardiovascular: Normal rate and rhythm. S1,S2 noted.  No murmur, rubs or gallops noted. No JVD or BLE edema. No carotid bruits noted. Pulmonary/Chest: Normal effort and positive vesicular breath sounds. No respiratory distress. No wheezes, rales or ronchi noted.  Abdomen: Soft and nontender. Normal bowel sounds, no bruits noted. No distention or masses noted. Liver, spleen and kidneys non palpable. g tube LUQ.   BMET    Component Value Date/Time   NA 139 10/29/2012 1058   NA 136 10/15/2012 1151   K 4.0 10/29/2012 1058   K 4.1 10/15/2012 1151   CL 104 10/29/2012 1058   CL 103 10/15/2012 1151   CO2 26 10/29/2012 1058   CO2 28 10/15/2012 1151   GLUCOSE 67* 10/29/2012 1058   GLUCOSE 68* 10/15/2012 1151   GLUCOSE 92 03/06/2010   BUN 11.6 10/29/2012 1058   BUN 12 10/15/2012 1151   CREATININE 0.6 10/29/2012 1058   CREATININE 0.6 10/15/2012 1151   CREATININE 1.30* 07/23/2012 1342   CALCIUM 9.4 10/29/2012 1058   CALCIUM 8.8 10/15/2012 1151   GFRNONAA >90 09/27/2012 0530   GFRAA >90 09/27/2012 0530    Lipid Panel     Component Value Date/Time   CHOL 168 10/14/2011 1703   TRIG 93.0 10/14/2011 1703   HDL 48.70 10/14/2011 1703   CHOLHDL 3 10/14/2011 1703   VLDL 18.6 10/14/2011 1703   LDLCALC 101* 10/14/2011 1703    CBC    Component Value Date/Time   WBC 6.3 10/29/2012 1058   WBC 6.6 10/21/2012 1341   RBC 3.94 10/29/2012 1058   RBC 3.66* 10/21/2012 1341   HGB 11.7 10/29/2012 1058   HGB 10.6* 10/21/2012 1341   HCT 35.4 10/29/2012 1058   HCT 33.0* 10/21/2012 1341   PLT 163 10/29/2012 1058   PLT 105.0* 10/21/2012 1341   MCV 89.7 10/29/2012 1058   MCV 90.3  10/21/2012 1341   MCH 29.7 10/29/2012 1058   MCH 27.7 09/27/2012 0840   MCHC 33.1 10/29/2012 1058   MCHC 32.2 10/21/2012 1341   RDW 18.5* 10/29/2012 1058   RDW 19.4* 10/21/2012 1341   LYMPHSABS 1.1 10/29/2012 1058   LYMPHSABS 1.3 10/21/2012 1341   MONOABS 0.5 10/29/2012 1058   MONOABS 0.5 10/21/2012 1341   EOSABS 0.1 10/29/2012 1058   EOSABS 0.0 10/21/2012 1341   BASOSABS 0.0 10/29/2012 1058   BASOSABS 0.0 10/21/2012 1341    Hgb A1C Lab Results  Component Value Date   HGBA1C 6.1* 08/24/2012         Assessment & Plan:   Maceration of g-tube site:  Dressing changed and area cleaned with warm soap and water No evidence of cellulitis Try to change dressing BID Call you oncologist to see if he can get you in touch wit the general surgeon to have the g tube removed  RTC as needed

## 2012-11-15 NOTE — Patient Instructions (Signed)
Cellulitis Cellulitis is an infection of the skin and the tissue beneath it. The infected area is usually red and tender. Cellulitis occurs most often in the arms and lower legs.  CAUSES  Cellulitis is caused by bacteria that enter the skin through cracks or cuts in the skin. The most common types of bacteria that cause cellulitis are Staphylococcus and Streptococcus. SYMPTOMS   Redness and warmth.  Swelling.  Tenderness or pain.  Fever. DIAGNOSIS  Your caregiver can usually determine what is wrong based on a physical exam. Blood tests may also be done. TREATMENT  Treatment usually involves taking an antibiotic medicine. HOME CARE INSTRUCTIONS   Take your antibiotics as directed. Finish them even if you start to feel better.  Keep the infected arm or leg elevated to reduce swelling.  Apply a warm cloth to the affected area up to 4 times per day to relieve pain.  Only take over-the-counter or prescription medicines for pain, discomfort, or fever as directed by your caregiver.  Keep all follow-up appointments as directed by your caregiver. SEEK MEDICAL CARE IF:   You notice red streaks coming from the infected area.  Your red area gets larger or turns dark in color.  Your bone or joint underneath the infected area becomes painful after the skin has healed.  Your infection returns in the same area or another area.  You notice a swollen bump in the infected area.  You develop new symptoms. SEEK IMMEDIATE MEDICAL CARE IF:   You have a fever.  You feel very sleepy.  You develop vomiting or diarrhea.  You have a general ill feeling (malaise) with muscle aches and pains. MAKE SURE YOU:   Understand these instructions.  Will watch your condition.  Will get help right away if you are not doing well or get worse. Document Released: 02/05/2005 Document Revised: 10/28/2011 Document Reviewed: 07/14/2011 Exit[employer] Patient Information 2014 [employer].  

## 2012-11-16 ENCOUNTER — Encounter: Payer: Self-pay | Admitting: Radiation Oncology

## 2012-11-16 ENCOUNTER — Ambulatory Visit
Admission: RE | Admit: 2012-11-16 | Discharge: 2012-11-16 | Disposition: A | Discharge status: Home / Self Care | Payer: 59 | Source: Ambulatory Visit | Attending: Radiation Oncology | Admitting: Radiation Oncology

## 2012-11-16 ENCOUNTER — Other Ambulatory Visit: Payer: Self-pay | Admitting: *Deleted

## 2012-11-16 VITALS — BP 126/68 | HR 72 | Resp 18 | Wt 165.5 lb

## 2012-11-16 DIAGNOSIS — C50911 Malignant neoplasm of unspecified site of right female breast: Secondary | ICD-10-CM

## 2012-11-16 DIAGNOSIS — E43 Unspecified severe protein-calorie malnutrition: Secondary | ICD-10-CM

## 2012-11-16 NOTE — Progress Notes (Signed)
Pt spoke with this RN post radiation appointment requesting if PEG tube could be removed. [name] states she has not used it for nutrition or hydration for " almost 1 month and it feels so good to eat food again ".  Per MD recommendation order given for IR to remove PEG tube.

## 2012-11-16 NOTE — Progress Notes (Signed)
  Radiation Oncology         [phone] ________________________________  Name: [name] MRN: [id number]  Date: 11/15/2012  DOB: Aug 03, 1955  Simulation Verification Note  Status: outpatient  NARRATIVE: The patient was brought to the treatment unit and placed in the planned treatment position. The clinical setup was verified. Then port films were obtained and uploaded to the radiation oncology medical record software.  The treatment beams were carefully compared against the planned radiation fields. The position location and shape of the radiation fields was reviewed. The targeted volume of tissue appears appropriately covered by the radiation beams. Organs at risk appear to be excluded as planned.  Based on my personal review, I approved the simulation verification. The patient's treatment will proceed as planned.  ------------------------------------------------  Lurline Hare, MD

## 2012-11-16 NOTE — Progress Notes (Signed)
Weekly Management Note Current Dose: 1.8  Gy  Projected Dose:45  Gy   Narrative:  The patient presents for routine under treatment assessment.  CBCT/MVCT images/Port film x-rays were reviewed.  The chart was checked. Doing well. No complaints. Excited to have feeding tube removed. RN education performed.  Physical Findings: Weight: 165 lb 8 oz (75.07 kg). Unchanged  Impression:  The patient is tolerating radiation.  Plan:  Continue treatment as planned. Start radiaplex

## 2012-11-16 NOTE — Progress Notes (Signed)
Denies pain, skin changes or fatigue. Oriented patient to staff and routine of the clinic. Provided patient with RADIATION THERAPY AND YOU handbook, then reviewed pertinent information. Educated patient on potential side effects and management such as, fatigue and skin changes. Provided patient with radiaplex and alra then, directed upon use. Patient verbalized understanding of all reviewed.

## 2012-11-17 ENCOUNTER — Ambulatory Visit
Admission: RE | Admit: 2012-11-17 | Discharge: 2012-11-17 | Disposition: A | Discharge status: Home / Self Care | Payer: 59 | Source: Ambulatory Visit | Attending: Radiation Oncology | Admitting: Radiation Oncology

## 2012-11-18 ENCOUNTER — Encounter: Payer: Self-pay | Admitting: Radiation Oncology

## 2012-11-18 ENCOUNTER — Ambulatory Visit (HOSPITAL_COMMUNITY)
Admission: RE | Admit: 2012-11-18 | Discharge: 2012-11-18 | Disposition: A | Discharge status: Home / Self Care | Payer: 59 | Source: Ambulatory Visit | Attending: Oncology | Admitting: Oncology

## 2012-11-18 ENCOUNTER — Ambulatory Visit
Admission: RE | Admit: 2012-11-18 | Discharge: 2012-11-18 | Disposition: A | Discharge status: Home / Self Care | Payer: 59 | Source: Ambulatory Visit | Attending: Radiation Oncology | Admitting: Radiation Oncology

## 2012-11-18 DIAGNOSIS — E43 Unspecified severe protein-calorie malnutrition: Secondary | ICD-10-CM

## 2012-11-18 DIAGNOSIS — C50911 Malignant neoplasm of unspecified site of right female breast: Secondary | ICD-10-CM

## 2012-11-18 DIAGNOSIS — Z431 Encounter for attention to gastrostomy: Secondary | ICD-10-CM | POA: Insufficient documentation

## 2012-11-18 MED ORDER — LIDOCAINE VISCOUS 2 % MT SOLN
15.0000 mL | Freq: Once | OROMUCOSAL | Status: AC
  Administered 2012-11-18: 15 mL via OROMUCOSAL

## 2012-11-19 ENCOUNTER — Other Ambulatory Visit: Payer: Self-pay | Admitting: Family

## 2012-11-19 ENCOUNTER — Ambulatory Visit
Admission: RE | Admit: 2012-11-19 | Discharge: 2012-11-19 | Disposition: A | Discharge status: Home / Self Care | Payer: 59 | Source: Ambulatory Visit | Attending: Radiation Oncology | Admitting: Radiation Oncology

## 2012-11-19 MED ORDER — RADIAPLEXRX EX GEL
Freq: Once | CUTANEOUS | Status: AC
  Administered 2012-11-19: 17:00:00 via TOPICAL

## 2012-11-19 MED ORDER — ALRA NON-METALLIC DEODORANT (RAD-ONC)
1.0000 "application " | Freq: Once | TOPICAL | Status: AC
  Administered 2012-11-19: 1 via TOPICAL

## 2012-11-19 NOTE — Addendum Note (Signed)
Encounter addended by: Agnes Lawrence, RN on: 11/19/2012  5:20 PM<BR>     Documentation filed: Chief Complaint Section, Inpatient Document Flowsheet, Inpatient Patient Education, Inpatient MAR, Orders

## 2012-11-19 NOTE — Telephone Encounter (Signed)
Refill- spironolactone 25mg  tablet. Take one tablet by mouth daily(diuretic). Qty 30 last fill 6.9.14  Refill- olanzapine 2.5mg  tablet. Take one tablet by mouth at bedtime to help with sleep/anxiety. Qty 30 last fill 6.11.14

## 2012-11-22 ENCOUNTER — Ambulatory Visit
Admission: RE | Admit: 2012-11-22 | Discharge: 2012-11-22 | Disposition: A | Discharge status: Home / Self Care | Payer: 59 | Source: Ambulatory Visit | Attending: Radiation Oncology | Admitting: Radiation Oncology

## 2012-11-22 ENCOUNTER — Telehealth: Payer: Self-pay | Admitting: Family

## 2012-11-22 MED ORDER — SPIRONOLACTONE 25 MG PO TABS: 25.0000 mg | ORAL_TABLET | Freq: Every day | ORAL | Status: DC

## 2012-11-22 NOTE — Telephone Encounter (Signed)
Patient states that she would like refills of spironolactone and olanzapine to be sent to [facility] pharmacy. She states that the hospital physicians had originally wrote these prescriptions.

## 2012-11-22 NOTE — Telephone Encounter (Addendum)
Refill sent for aldactone.  [facility]. zyprexa should be refilled by Dr. Noe Gens- psychiatry. If she has not yet seen Dr. Noe Gens- can give 30 day supply only until she can get in to see psych.

## 2012-11-22 NOTE — Telephone Encounter (Signed)
[name] at 11/22/2012 11:56 AM   Status: Signed            Patient states that she would like refills of spironolactone and olanzapine to be sent to [facility] pharmacy. She states that the hospital physicians had originally wrote these prescriptions.

## 2012-11-22 NOTE — Telephone Encounter (Signed)
THIS IS A DUPLICATE NOTE; OPEN NOTE TO PROVIDER IN SYSTEM 07.11.14, MESSAGE COPY & PASTE TO ORIGINAL/SLS

## 2012-11-23 ENCOUNTER — Ambulatory Visit
Admission: RE | Admit: 2012-11-23 | Discharge: 2012-11-23 | Disposition: A | Discharge status: Home / Self Care | Payer: 59 | Source: Ambulatory Visit | Attending: Radiation Oncology | Admitting: Radiation Oncology

## 2012-11-23 ENCOUNTER — Encounter: Payer: Self-pay | Admitting: Radiation Oncology

## 2012-11-23 VITALS — BP 138/77 | HR 68 | Temp 97.5°F | Resp 20 | Wt 168.5 lb

## 2012-11-23 DIAGNOSIS — C50911 Malignant neoplasm of unspecified site of right female breast: Secondary | ICD-10-CM

## 2012-11-23 NOTE — Progress Notes (Signed)
Weekly Management Note Current Dose:   10.8Gy  Projected Dose:  60.4 Gy   Narrative:  The patient presents for routine under treatment assessment.  CBCT/MVCT images/Port film x-rays were reviewed.  The chart was checked. Feeling well. No complaints.  Physical Findings: Weight: 168 lb 8 oz (76.431 kg). Unchanged  Impression:  The patient is tolerating radiation.  Plan:  Continue treatment as planned. Continue radiaplex.

## 2012-11-23 NOTE — Progress Notes (Signed)
Weekly rad tx right chest wall rad txs 6 completed, uing radiaplex gel bid, no c/o pain or discomfort,  No skin changes, intact 9:28 AM

## 2012-11-24 ENCOUNTER — Ambulatory Visit
Admission: RE | Admit: 2012-11-24 | Discharge: 2012-11-24 | Disposition: A | Discharge status: Home / Self Care | Payer: 59 | Source: Ambulatory Visit | Attending: Radiation Oncology | Admitting: Radiation Oncology

## 2012-11-25 ENCOUNTER — Telehealth: Payer: Self-pay | Admitting: *Deleted

## 2012-11-25 ENCOUNTER — Ambulatory Visit
Admission: RE | Admit: 2012-11-25 | Discharge: 2012-11-25 | Disposition: A | Discharge status: Home / Self Care | Payer: 59 | Source: Ambulatory Visit | Attending: Radiation Oncology | Admitting: Radiation Oncology

## 2012-11-25 MED ORDER — OLANZAPINE 2.5 MG PO TABS: 2.5000 mg | ORAL_TABLET | Freq: Every day | ORAL | Status: DC

## 2012-11-25 NOTE — Telephone Encounter (Signed)
Received message from [facility] pharmacy requesting status of olanzapine Rx.  Notified pt of instruction below. Pt states she saw Dr Noe Gens once and could not afford to return and pay her $50 copay each visit.  Pt reports now her insurance will run out at the end of this month as well. Pt reports "tremendous improvement since starting this medication".  Advised pt I would send 30 day supply per instruction below. Please advise.    Sandford Craze, NP at 11/22/2012 4:43 PM    Status: Addendum             Refill sent for aldactone. [facility].  zyprexa should be refilled by Dr. Noe Gens- psychiatry. If she has not yet seen Dr. Noe Gens- can give 30 day supply only until she can get in to see psych.

## 2012-11-25 NOTE — Telephone Encounter (Signed)
OK- can provid #30 with two additional refills.

## 2012-11-25 NOTE — Telephone Encounter (Signed)
Refill corrected and notified pt.

## 2012-11-26 ENCOUNTER — Ambulatory Visit (INDEPENDENT_AMBULATORY_CARE_PROVIDER_SITE_OTHER): Payer: 59 | Admitting: *Deleted

## 2012-11-26 ENCOUNTER — Ambulatory Visit
Admission: RE | Admit: 2012-11-26 | Discharge: 2012-11-26 | Disposition: A | Discharge status: Home / Self Care | Payer: 59 | Source: Ambulatory Visit | Attending: Radiation Oncology | Admitting: Radiation Oncology

## 2012-11-26 DIAGNOSIS — Z86718 Personal history of other venous thrombosis and embolism: Secondary | ICD-10-CM

## 2012-11-26 DIAGNOSIS — I4891 Unspecified atrial fibrillation: Secondary | ICD-10-CM

## 2012-11-29 ENCOUNTER — Ambulatory Visit
Admission: RE | Admit: 2012-11-29 | Discharge: 2012-11-29 | Disposition: A | Discharge status: Home / Self Care | Payer: 59 | Source: Ambulatory Visit | Attending: Radiation Oncology | Admitting: Radiation Oncology

## 2012-11-30 ENCOUNTER — Ambulatory Visit
Admission: RE | Admit: 2012-11-30 | Discharge: 2012-11-30 | Disposition: A | Discharge status: Home / Self Care | Payer: 59 | Source: Ambulatory Visit | Attending: Radiation Oncology | Admitting: Radiation Oncology

## 2012-11-30 ENCOUNTER — Encounter: Payer: Self-pay | Admitting: Radiation Oncology

## 2012-11-30 VITALS — BP 120/78 | HR 69 | Temp 97.7°F | Resp 20 | Wt 171.7 lb

## 2012-11-30 DIAGNOSIS — C50911 Malignant neoplasm of unspecified site of right female breast: Secondary | ICD-10-CM

## 2012-11-30 NOTE — Progress Notes (Signed)
Weekly Management Note Current Dose: 19.8  Gy  Projected Dose: 60.4 Gy   Narrative:  The patient presents for routine under treatment assessment.  CBCT/MVCT images/Port film x-rays were reviewed.  The chart was checked. Doing well. No complaints except weight gain.   Physical Findings: Weight: 171 lb 11.2 oz (77.883 kg). Unchanged. Numb under right arm  Impression:  The patient is tolerating radiation.  Plan:  Continue treatment as planned.Continue radiaplex.

## 2012-11-30 NOTE — Progress Notes (Signed)
Weekly rad txs rcw 11 completed, erythema, skin intact,no c/o pain, using radiaplex gel bid,gain 3lbs, great appetite

## 2012-12-01 ENCOUNTER — Ambulatory Visit
Admission: RE | Admit: 2012-12-01 | Discharge: 2012-12-01 | Disposition: A | Discharge status: Home / Self Care | Payer: 59 | Source: Ambulatory Visit | Attending: Radiation Oncology | Admitting: Radiation Oncology

## 2012-12-02 ENCOUNTER — Ambulatory Visit
Admission: RE | Admit: 2012-12-02 | Discharge: 2012-12-02 | Disposition: A | Discharge status: Home / Self Care | Payer: 59 | Source: Ambulatory Visit | Attending: Radiation Oncology | Admitting: Radiation Oncology

## 2012-12-03 ENCOUNTER — Ambulatory Visit
Admission: RE | Admit: 2012-12-03 | Discharge: 2012-12-03 | Disposition: A | Discharge status: Home / Self Care | Payer: 59 | Source: Ambulatory Visit | Attending: Radiation Oncology | Admitting: Radiation Oncology

## 2012-12-06 ENCOUNTER — Ambulatory Visit
Admission: RE | Admit: 2012-12-06 | Discharge: 2012-12-06 | Disposition: A | Discharge status: Home / Self Care | Payer: 59 | Source: Ambulatory Visit | Attending: Radiation Oncology | Admitting: Radiation Oncology

## 2012-12-07 ENCOUNTER — Encounter: Payer: Self-pay | Admitting: Radiation Oncology

## 2012-12-07 ENCOUNTER — Ambulatory Visit
Admission: RE | Admit: 2012-12-07 | Discharge: 2012-12-07 | Disposition: A | Discharge status: Home / Self Care | Payer: 59 | Source: Ambulatory Visit | Attending: Radiation Oncology | Admitting: Radiation Oncology

## 2012-12-07 VITALS — BP 135/64 | HR 85 | Temp 98.1°F | Resp 16 | Wt 177.7 lb

## 2012-12-07 DIAGNOSIS — C50911 Malignant neoplasm of unspecified site of right female breast: Secondary | ICD-10-CM

## 2012-12-07 NOTE — Progress Notes (Signed)
Weekly rad txs RCW 16/29 completed, bright erythema,, dry desquamation under arm, numbness there, no c/o pain, using radiaplex bid, good appetite,  9:54 AM

## 2012-12-07 NOTE — Progress Notes (Signed)
[facility]    Radiation Oncology [address] [place]     [name], M.D. [place], [place] [address]               Billie Lade, M.D., Ph.D. Phone: [phone]      Molli Hazard A. Kathrynn Running, M.D. Fax: [phone]      Radene Gunning, M.D., Ph.D.         Lurline Hare, M.D.         Grayland Jack, M.D Weekly Treatment Management Note  Name: [name]     MRN: [id number]        CSN: [id number] Date: 12/07/2012      DOB: 09-07-1955  CC: Rhonda Fillers., NP         [name]    Status: Outpatient  Diagnosis: The encounter diagnosis was Cancer of breast, right.  Current Dose: 28.8 Gy  Current Fraction: 16  Planned Dose: 45 + Gy   Narrative: [name] was seen today for weekly treatment management. The chart was checked and port films  were reviewed. She is tolerating the treatments well without any significant itching or discomfort along the treatment area or fatigue  Olmesartan medoxomil; Tetracycline hcl; Venlafaxine; and Adhesive Current Outpatient Prescriptions  Medication Sig Dispense Refill  . Alum & Mag Hydroxide-Simeth (MAGIC MOUTHWASH W/LIDOCAINE) SOLN Take 5 mLs by mouth as needed.      [name] antiseptic oral rinse (BIOTENE) LIQD 15 mLs by Mouth Rinse route 2 (two) times daily as needed.  100 mL  3  . buPROPion (WELLBUTRIN SR) 150 MG 12 hr tablet Take 150 mg by mouth every morning.       . cholestyramine (QUESTRAN) 4 G packet Take 1 packet (4 g total) by mouth daily as needed. For diarrhea.  60 each  3  . clindamycin (CLEOCIN) 300 MG capsule Take 1 capsule (300 mg total) by mouth 4 (four) times daily. X 7 days  28 capsule  0  . clonazePAM (KLONOPIN) 0.5 MG tablet Take 0.5 tablets (0.25 mg total) by mouth 2 (two) times daily. For anxiety  60 tablet  2  . escitalopram (LEXAPRO) 10 MG tablet Take 1 tablet (10 mg total) by mouth at bedtime.  31 tablet  0  . furosemide (LASIX) 80 MG tablet Take 40 mg by mouth 2 (two) times daily.      .  hyaluronate sodium (RADIAPLEXRX) GEL Apply topically 2 (two) times daily.      [name] letrozole (FEMARA) 2.5 MG tablet Take 1 tablet (2.5 mg total) by mouth daily.  30 tablet  3  . levothyroxine (SYNTHROID, LEVOTHROID) 125 MCG tablet Take 125 mcg by mouth daily before breakfast.      . lidocaine-prilocaine (EMLA) cream Apply 1 application topically as needed (for port access).      [name] loperamide (IMODIUM) 2 MG capsule Take 1 capsule (2 mg total) by mouth 3 (three) times daily before meals.  30 capsule  0  . metoprolol tartrate (LOPRESSOR) 25 MG tablet Take 1 tablet (25 mg total) by mouth 2 (two) times daily.  60 tablet  2  . non-metallic deodorant (ALRA) MISC Apply 1 application topically daily as needed.      . Nutritional Supplements (FEEDING SUPPLEMENT, OSMOLITE 1.5 CAL,) LIQD Place 237 mLs into feeding tube 2 (two) times daily after a meal. Breakfast and lunch.    0  . OLANZapine (ZYPREXA) 2.5 MG tablet Take 1 tablet (2.5 mg total) by mouth at  bedtime. To help with sleep/anxiety.  30 tablet  2  . ondansetron (ZOFRAN ODT) 4 MG disintegrating tablet Take 1 tablet (4 mg total) by mouth every 8 (eight) hours as needed for nausea.  20 tablet  0  . potassium chloride (K-DUR) 10 MEQ tablet Take 10 mEq by mouth 2 (two) times daily.      [name] saccharomyces boulardii (FLORASTOR) 250 MG capsule Take 1 capsule (250 mg total) by mouth 2 (two) times daily.  60 capsule  1  . sodium chloride 0.9 % injection 10-40 mLs by Intracatheter route as needed (flush).  5 mL  3  . spironolactone (ALDACTONE) 25 MG tablet Take 1 tablet (25 mg total) by mouth daily. diuretic  30 tablet  2  . warfarin (COUMADIN) 3 MG tablet Take 6-9 mg by mouth daily. 9 mg on Monday and Friday & 6 mg on all other days.      . Water For Irrigation, Sterile (FREE WATER) SOLN Place 160 mLs into feeding tube 4 (four) times daily - after meals and at bedtime.       No current facility-administered medications for this encounter.       Physical  Examination:  weight is 177 lb 11.2 oz (80.604 kg). Her oral temperature is 98.1 F (36.7 C). Her blood pressure is 135/64 and her pulse is 85. Her respiration is 16.    Wt Readings from Last 3 Encounters:  12/07/12 177 lb 11.2 oz (80.604 kg)  11/30/12 171 lb 11.2 oz (77.883 kg)  11/23/12 168 lb 8 oz (76.431 kg)    The right chest wall area shows brisk erythema without any moist desquamation. Lungs - Normal respiratory effort, chest expands symmetrically. Lungs are clear to auscultation, no crackles or wheezes.  Heart has regular rhythm and rate  Abdomen is soft and non tender with normal bowel sounds  Assessment:  Patient tolerating treatments well  Plan: Continue treatment per original radiation prescription

## 2012-12-08 ENCOUNTER — Ambulatory Visit
Admission: RE | Admit: 2012-12-08 | Discharge: 2012-12-08 | Disposition: A | Discharge status: Home / Self Care | Payer: 59 | Source: Ambulatory Visit | Attending: Radiation Oncology | Admitting: Radiation Oncology

## 2012-12-09 ENCOUNTER — Ambulatory Visit
Admission: RE | Admit: 2012-12-09 | Discharge: 2012-12-09 | Disposition: A | Discharge status: Home / Self Care | Payer: 59 | Source: Ambulatory Visit | Attending: Radiation Oncology | Admitting: Radiation Oncology

## 2012-12-10 ENCOUNTER — Ambulatory Visit (INDEPENDENT_AMBULATORY_CARE_PROVIDER_SITE_OTHER): Payer: 59 | Admitting: *Deleted

## 2012-12-10 ENCOUNTER — Ambulatory Visit
Admission: RE | Admit: 2012-12-10 | Discharge: 2012-12-10 | Disposition: A | Discharge status: Home / Self Care | Payer: 59 | Source: Ambulatory Visit | Attending: Radiation Oncology | Admitting: Radiation Oncology

## 2012-12-10 DIAGNOSIS — I4891 Unspecified atrial fibrillation: Secondary | ICD-10-CM

## 2012-12-10 DIAGNOSIS — C50919 Malignant neoplasm of unspecified site of unspecified female breast: Secondary | ICD-10-CM

## 2012-12-10 DIAGNOSIS — Z86718 Personal history of other venous thrombosis and embolism: Secondary | ICD-10-CM

## 2012-12-10 MED ORDER — RADIAPLEXRX EX GEL
Freq: Once | CUTANEOUS | Status: AC
  Administered 2012-12-10: 15:00:00 via TOPICAL

## 2012-12-13 ENCOUNTER — Encounter (INDEPENDENT_AMBULATORY_CARE_PROVIDER_SITE_OTHER): Payer: Self-pay | Admitting: General Surgery

## 2012-12-13 ENCOUNTER — Ambulatory Visit
Admission: RE | Admit: 2012-12-13 | Discharge: 2012-12-13 | Disposition: A | Discharge status: Home / Self Care | Payer: 59 | Source: Ambulatory Visit | Attending: Radiation Oncology | Admitting: Radiation Oncology

## 2012-12-14 ENCOUNTER — Encounter: Payer: Self-pay | Admitting: Radiation Oncology

## 2012-12-14 ENCOUNTER — Ambulatory Visit
Admission: RE | Admit: 2012-12-14 | Discharge: 2012-12-14 | Disposition: A | Discharge status: Home / Self Care | Payer: 59 | Source: Ambulatory Visit | Attending: Radiation Oncology | Admitting: Radiation Oncology

## 2012-12-14 VITALS — BP 148/77 | HR 79 | Temp 97.5°F | Resp 20 | Wt 182.4 lb

## 2012-12-14 DIAGNOSIS — C50911 Malignant neoplasm of unspecified site of right female breast: Secondary | ICD-10-CM

## 2012-12-14 NOTE — Progress Notes (Signed)
Weekly Management Note Current Dose: 37.8  Gy  Projected Dose: 60.4 Gy   Narrative:  The patient presents for routine under treatment assessment.  CBCT/MVCT images/Port film x-rays were reviewed.  The chart was checked. Feeling well. Using radiaplex. No pain.   Physical Findings: Weight: 182 lb 6.4 oz (82.736 kg). Dry desquamation over right chest wall and drain site. No moist desquamation.   Impression:  The patient is tolerating radiation.  Plan:  Continue treatment as planned. Reeval skin on Friday.  ? D/c tangents early as she is having a significant skin reaction now.

## 2012-12-14 NOTE — Progress Notes (Signed)
Weekly rad txs,  17/25 rt breast, bright erythema skin thginning, but intact, using radiaplex 2-3x day,  No c/o pain 9:38 AM

## 2012-12-15 ENCOUNTER — Ambulatory Visit
Admission: RE | Admit: 2012-12-15 | Discharge: 2012-12-15 | Disposition: A | Discharge status: Home / Self Care | Payer: 59 | Source: Ambulatory Visit | Attending: Radiation Oncology | Admitting: Radiation Oncology

## 2012-12-16 ENCOUNTER — Ambulatory Visit
Admission: RE | Admit: 2012-12-16 | Discharge: 2012-12-16 | Disposition: A | Discharge status: Home / Self Care | Payer: 59 | Source: Ambulatory Visit | Attending: Radiation Oncology | Admitting: Radiation Oncology

## 2012-12-17 ENCOUNTER — Ambulatory Visit: Payer: 59 | Admitting: Radiation Oncology

## 2012-12-17 ENCOUNTER — Encounter: Payer: Self-pay | Admitting: Radiation Oncology

## 2012-12-17 ENCOUNTER — Ambulatory Visit (INDEPENDENT_AMBULATORY_CARE_PROVIDER_SITE_OTHER): Payer: 59 | Admitting: Family

## 2012-12-17 ENCOUNTER — Encounter: Payer: Self-pay | Admitting: Family

## 2012-12-17 ENCOUNTER — Ambulatory Visit
Admission: RE | Admit: 2012-12-17 | Discharge: 2012-12-17 | Disposition: A | Discharge status: Home / Self Care | Payer: 59 | Source: Ambulatory Visit | Attending: Radiation Oncology | Admitting: Radiation Oncology

## 2012-12-17 VITALS — BP 110/70 | HR 78 | Temp 97.7°F | Resp 16 | Ht 64.0 in | Wt 183.0 lb

## 2012-12-17 DIAGNOSIS — I10 Essential (primary) hypertension: Secondary | ICD-10-CM

## 2012-12-17 DIAGNOSIS — E039 Hypothyroidism, unspecified: Secondary | ICD-10-CM

## 2012-12-17 DIAGNOSIS — E46 Unspecified protein-calorie malnutrition: Secondary | ICD-10-CM

## 2012-12-17 DIAGNOSIS — F329 Major depressive disorder, single episode, unspecified: Secondary | ICD-10-CM

## 2012-12-17 DIAGNOSIS — F3289 Other specified depressive episodes: Secondary | ICD-10-CM

## 2012-12-17 DIAGNOSIS — I4891 Unspecified atrial fibrillation: Secondary | ICD-10-CM

## 2012-12-17 DIAGNOSIS — C50919 Malignant neoplasm of unspecified site of unspecified female breast: Secondary | ICD-10-CM

## 2012-12-17 NOTE — Assessment & Plan Note (Signed)
TSH normal, continue synthroid.

## 2012-12-17 NOTE — Progress Notes (Signed)
Subjective:    Patient ID: [name], female    DOB: 1955-09-19, 57 y.o.   MRN: [id number]  HPI  [name] is a 57 yr old female who presents today for follow up of multiple medical problems.   1) Malnutrition- PEG is removed.  Appetite back to normal.    2) hypothyroid-  Currently on synthroid .   3) AF- maintained on coumadin- follows with Dr. Eden Emms and monitored by the coumadin clinic.    4) Depression- reports that she is feeling well.  She reports that she is now on disabily.  She is maintained.   5) Breast Cancer- she has 2 more weeks of radiation for breast cancer.     Review of Systems    see HPI  Past Medical History  Diagnosis Date  . Depression   . DVT (deep venous thrombosis)     hx of on HRT left leg ~2006  . GERD (gastroesophageal reflux disease)   . Hyperlipidemia   . Hypertension   . Hypothyroidism   . PPD positive, treated     rx inh   . OSA on CPAP   . Diabetes mellitus   . 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 Breast cancer     a. 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 DJD (degenerative joint disease) of lumbar spine   . Chronic diastolic CHF (congestive heart failure)     a. 08/2012 Echo: EF 55-60%, no rwma, mod MR.  [name] A-fib     a. on amio for rate control/coumadin.  [name] Physical deconditioning   . Allergy   . Anemia     low iron hx  . Blood transfusion without reported diagnosis     2 u prbc  . Cataract     removed ou    History   Social History  . Marital Status: Married    Spouse Name: N/A    Number of Children: 2  . Years of Education: N/A   Occupational History  . SECRETARY [employer]   Social History Main Topics  . Smoking status: Former Smoker -- 1.00 packs/day for 1 years  . Smokeless tobacco: Never Used  . Alcohol Use: No  . Drug Use: No     Comment: quit age 10 only smoked  as a teen 1 year  . Sexually Active: No   Other Topics Concern  . Not on file   Social History Narrative   Married   Works at [employer] secretary   Daily caffeine use - 2 cups a day plus a couple of sodas a day   Pt doesn't exercise regularly   G2P2   H H of 5 soon to be 6 .     Past Surgical History  Procedure Laterality Date  . Tubal ligation    . Cholecystectomy    . Foot surgery    . Eye surgery    . Cataract extraction    . Percutaneous liver biopsy    . Breast biopsy      left breast  . Mastectomy modified radical  06/08/2012    Procedure: MASTECTOMY MODIFIED RADICAL;  Surgeon: Mariella Saa, MD;  Location: [facility] OR;  Service: General;  Laterality: Right;  . Portacath placement  06/08/2012    Procedure: INSERTION PORT-A-CATH;  Surgeon: Mariella Saa, MD;  Location: [facility]  OR;  Service: General;  Laterality: Left;  . Peg tube placement      Family History  Problem Relation Age of Onset  . Diabetes Mother   . Hypertension Mother   . Arthritis Mother   . Heart disease Mother   . Heart failure Mother   . Other Mother     benign breast mass  . Stroke Father   . Heart disease Father   . Diabetes Paternal Grandmother   . Colon cancer Paternal Grandfather     Allergies  Allergen Reactions  . Olmesartan Medoxomil Cough    REACTION: ? if cough  . Tetracycline Hcl     Unknown reaction, too long for patient to remember   . Venlafaxine     REACTION: severe dry moouth  . Adhesive (Tape) Rash    Current Outpatient Prescriptions on File Prior to Visit  Medication Sig Dispense Refill  . Alum & Mag Hydroxide-Simeth (MAGIC MOUTHWASH W/LIDOCAINE) SOLN Take 5 mLs by mouth as needed.      [name] antiseptic oral rinse (BIOTENE) LIQD 15 mLs by Mouth Rinse route 2 (two) times daily as needed.  100 mL  3  . buPROPion (WELLBUTRIN SR) 150 MG 12 hr tablet Take 150 mg by mouth every morning.       . cholestyramine (QUESTRAN) 4 G packet Take 1 packet (4 g total) by mouth  daily as needed. For diarrhea.  60 each  3  . clindamycin (CLEOCIN) 300 MG capsule Take 1 capsule (300 mg total) by mouth 4 (four) times daily. X 7 days  28 capsule  0  . clonazePAM (KLONOPIN) 0.5 MG tablet Take 0.5 tablets (0.25 mg total) by mouth 2 (two) times daily. For anxiety  60 tablet  2  . escitalopram (LEXAPRO) 10 MG tablet Take 1 tablet (10 mg total) by mouth at bedtime.  31 tablet  0  . furosemide (LASIX) 80 MG tablet Take 40 mg by mouth 2 (two) times daily.      . hyaluronate sodium (RADIAPLEXRX) GEL Apply topically 2 (two) times daily.      [name] letrozole (FEMARA) 2.5 MG tablet Take 1 tablet (2.5 mg total) by mouth daily.  30 tablet  3  . levothyroxine (SYNTHROID, LEVOTHROID) 125 MCG tablet Take 125 mcg by mouth daily before breakfast.      . lidocaine-prilocaine (EMLA) cream Apply 1 application topically as needed (for port access).      [name] loperamide (IMODIUM) 2 MG capsule Take 1 capsule (2 mg total) by mouth 3 (three) times daily before meals.  30 capsule  0  . metoprolol tartrate (LOPRESSOR) 25 MG tablet Take 1 tablet (25 mg total) by mouth 2 (two) times daily.  60 tablet  2  . non-metallic deodorant (ALRA) MISC Apply 1 application topically daily as needed.      . Nutritional Supplements (FEEDING SUPPLEMENT, OSMOLITE 1.5 CAL,) LIQD Place 237 mLs into feeding tube 2 (two) times daily after a meal. Breakfast and lunch.    0  . OLANZapine (ZYPREXA) 2.5 MG tablet Take 1 tablet (2.5 mg total) by mouth at bedtime. To help with sleep/anxiety.  30 tablet  2  . ondansetron (ZOFRAN ODT) 4 MG disintegrating tablet Take 1 tablet (4 mg total) by mouth every 8 (eight) hours as needed for nausea.  20 tablet  0  . potassium chloride (K-DUR) 10 MEQ tablet Take 10 mEq by mouth 2 (two) times daily.      [name] saccharomyces boulardii (FLORASTOR) 250 MG  capsule Take 1 capsule (250 mg total) by mouth 2 (two) times daily.  60 capsule  1  . sodium chloride 0.9 % injection 10-40 mLs by Intracatheter route as  needed (flush).  5 mL  3  . spironolactone (ALDACTONE) 25 MG tablet Take 1 tablet (25 mg total) by mouth daily. diuretic  30 tablet  2  . warfarin (COUMADIN) 3 MG tablet Take 6-9 mg by mouth daily. 9 mg on Monday and Friday & 6 mg on all other days.      . Water For Irrigation, Sterile (FREE WATER) SOLN Place 160 mLs into feeding tube 4 (four) times daily - after meals and at bedtime.       No current facility-administered medications on file prior to visit.    BP 110/70  Pulse 78  Temp(Src) 97.7 F (36.5 C) (Oral)  Resp 16  Ht 5\' 4"  (1.626 m)  Wt 183 lb (83.008 kg)  BMI 31.4 kg/m2  SpO2 99%    Objective:   Physical Exam  Constitutional: She appears well-developed and well-nourished. No distress.  HENT:  Head: Normocephalic and atraumatic.  Cardiovascular: Normal rate.   No murmur heard. Pulmonary/Chest: Effort normal and breath sounds normal. No respiratory distress. She has no wheezes. She has no rales. She exhibits no tenderness.  Psychiatric: She has a normal mood and affect.          Assessment & Plan:

## 2012-12-17 NOTE — Assessment & Plan Note (Signed)
Completing radiation therapy.  Tolerating well.  Management per oncology. She is without insurance.  I gave her # for [facility] office to inquire about pt assistance.

## 2012-12-17 NOTE — Assessment & Plan Note (Signed)
BP Readings from Last 3 Encounters:  12/17/12 110/70  12/14/12 148/77  12/07/12 135/64   BP stable on lopressor, aldactone, furosemide. Continue same.

## 2012-12-17 NOTE — Assessment & Plan Note (Signed)
Rate stable- on beta blocker/coumadin, management per cardiology.

## 2012-12-17 NOTE — Patient Instructions (Addendum)
Please call the [facility] office to inquire about the [facility] Patient Assistance program- [phone] Follow up in 3 months.

## 2012-12-17 NOTE — Progress Notes (Signed)
Name: CELSEY ASSELIN   MRN: [id number]  Date:  12/17/2012   DOB: 1955/11/17  Status:outpatient    DIAGNOSIS: Breast cancer.  CONSENT VERIFIED: yes   SET UP: Patient is setup supine   IMMOBILIZATION:  The following immobilization was used:Custom Moldable Pillow, breast board.   NARRATIVE: [name] underwent complex simulation and treatment planning for her boost treatment today.  Her tumor volume was outlined on the planning CT scan. The depth of her cavity was  0.8 Cm.     6 MeV electrons will be prescribed to the 100% isodose line.   A block will be used for beam modification purposes.  A special port plan is requested.

## 2012-12-17 NOTE — Assessment & Plan Note (Signed)
Improving. Peg is out.  Monitor.

## 2012-12-17 NOTE — Assessment & Plan Note (Signed)
Stable on current meds.  Continue same. 

## 2012-12-20 ENCOUNTER — Ambulatory Visit
Admission: RE | Admit: 2012-12-20 | Discharge: 2012-12-20 | Disposition: A | Discharge status: Home / Self Care | Payer: 59 | Source: Ambulatory Visit | Attending: Radiation Oncology | Admitting: Radiation Oncology

## 2012-12-21 ENCOUNTER — Ambulatory Visit
Admission: RE | Admit: 2012-12-21 | Discharge: 2012-12-21 | Disposition: A | Discharge status: Home / Self Care | Payer: 59 | Source: Ambulatory Visit | Attending: Radiation Oncology | Admitting: Radiation Oncology

## 2012-12-21 DIAGNOSIS — C50911 Malignant neoplasm of unspecified site of right female breast: Secondary | ICD-10-CM

## 2012-12-21 NOTE — Progress Notes (Signed)
patient not seen in nursing, MD saw patient in the treatment area

## 2012-12-21 NOTE — Progress Notes (Signed)
Weekly Management Note Current Dose: 43.2  Gy  Projected Dose: 53.2 Gy   Narrative:  The patient presents for routine under treatment assessment.  CBCT/MVCT images/Port film x-rays were reviewed.  The chart was checked.  Approved mark out on treatment machine. Moist desquamation in axiall and drain site.   Physical Findings: Dry desquamation over chest wall. Areas of moist desquamation in axilla.   Impression:  The patient is tolerating radiation.  Plan:  Continue treatment as planned. D/C tangents early and started boost. Neosporin to moist desquamation.

## 2012-12-22 ENCOUNTER — Ambulatory Visit
Admission: RE | Admit: 2012-12-22 | Discharge: 2012-12-22 | Disposition: A | Discharge status: Home / Self Care | Payer: 59 | Source: Ambulatory Visit | Attending: Radiation Oncology | Admitting: Radiation Oncology

## 2012-12-23 ENCOUNTER — Ambulatory Visit
Admission: RE | Admit: 2012-12-23 | Discharge: 2012-12-23 | Disposition: A | Discharge status: Home / Self Care | Payer: 59 | Source: Ambulatory Visit | Attending: Radiation Oncology | Admitting: Radiation Oncology

## 2012-12-24 ENCOUNTER — Ambulatory Visit
Admission: RE | Admit: 2012-12-24 | Discharge: 2012-12-24 | Disposition: A | Discharge status: Home / Self Care | Payer: 59 | Source: Ambulatory Visit | Attending: Radiation Oncology | Admitting: Radiation Oncology

## 2012-12-24 ENCOUNTER — Encounter: Payer: Self-pay | Admitting: Radiation Oncology

## 2012-12-24 ENCOUNTER — Ambulatory Visit (INDEPENDENT_AMBULATORY_CARE_PROVIDER_SITE_OTHER): Payer: 59 | Admitting: *Deleted

## 2012-12-24 DIAGNOSIS — I4891 Unspecified atrial fibrillation: Secondary | ICD-10-CM

## 2012-12-24 DIAGNOSIS — Z86718 Personal history of other venous thrombosis and embolism: Secondary | ICD-10-CM

## 2012-12-24 LAB — POCT INR: INR: 2.5

## 2012-12-27 ENCOUNTER — Ambulatory Visit: Payer: 59

## 2012-12-27 ENCOUNTER — Other Ambulatory Visit: Payer: Self-pay | Admitting: *Deleted

## 2012-12-27 MED ORDER — LEVOTHYROXINE SODIUM 125 MCG PO TABS: 125.0000 ug | ORAL_TABLET | Freq: Every day | ORAL | Status: DC

## 2012-12-27 NOTE — Telephone Encounter (Signed)
Rx request to pharmacy/SLS  

## 2012-12-28 ENCOUNTER — Ambulatory Visit: Payer: 59

## 2012-12-28 NOTE — Progress Notes (Signed)
  Radiation Oncology         [phone] ________________________________  Name: [name] MRN: [id number]  Date: 12/24/2012  DOB: 1955-06-09  End of Treatment Note  Diagnosis:   T3N1 Right breast cancer     Indication for treatment:  Curative       Radiation treatment dates:   11/16/2012-12/24/2012  Site/dose: Right chest wall/ 43.2 Gray @ 1.8 Wallace Cullens per fraction x 24 fractions Right supraclavicular fossa / 45 Gy @1 .8 Gy per fraction x 25 fractions Right drain sites / 40 Gy @ 2 Gy per fraction x 20 fractions Right scar boost / 10 Gray at [employer] per fraction x 5 fractions  Beams/energy:  Opposed Tangents / 6 MV photons LAO / 6 MV photons En face electrons/ 6 MeV  En face electrons/ 6 MeV  Narrative: The patient tolerated radiation treatment relatively well.   She had an exaggerated skin reaction so I finished her tangent fields early and just completed her scar boost.  Plan: The patient has completed radiation treatment. The patient will return to radiation oncology clinic for routine followup in one month. I advised them to call or return sooner if they have any questions or concerns related to their recovery or treatment.  ------------------------------------------------  Lurline Hare, MD

## 2012-12-29 ENCOUNTER — Ambulatory Visit: Payer: 59

## 2012-12-30 ENCOUNTER — Telehealth: Payer: Self-pay | Admitting: Oncology

## 2012-12-30 ENCOUNTER — Ambulatory Visit: Payer: 59

## 2012-12-30 ENCOUNTER — Ambulatory Visit (HOSPITAL_BASED_OUTPATIENT_CLINIC_OR_DEPARTMENT_OTHER): Payer: No Typology Code available for payment source | Admitting: Oncology

## 2012-12-30 ENCOUNTER — Other Ambulatory Visit (HOSPITAL_BASED_OUTPATIENT_CLINIC_OR_DEPARTMENT_OTHER): Payer: No Typology Code available for payment source | Admitting: Lab

## 2012-12-30 VITALS — BP 130/77 | HR 72 | Temp 97.7°F | Resp 19 | Ht 64.0 in | Wt 183.1 lb

## 2012-12-30 DIAGNOSIS — C50919 Malignant neoplasm of unspecified site of unspecified female breast: Secondary | ICD-10-CM

## 2012-12-30 DIAGNOSIS — C50911 Malignant neoplasm of unspecified site of right female breast: Secondary | ICD-10-CM

## 2012-12-30 LAB — CBC WITH DIFFERENTIAL/PLATELET
Basophils Absolute: 0 10*3/uL (ref 0.0–0.1)
Eosinophils Absolute: 0.1 10*3/uL (ref 0.0–0.5)
HCT: 33.1 % — ABNORMAL LOW (ref 34.8–46.6)
HGB: 11.1 g/dL — ABNORMAL LOW (ref 11.6–15.9)
MONO#: 0.5 10*3/uL (ref 0.1–0.9)
NEUT#: 3 10*3/uL (ref 1.5–6.5)
NEUT%: 70.7 % (ref 38.4–76.8)
WBC: 4.3 10*3/uL (ref 3.9–10.3)
lymph#: 0.7 10*3/uL — ABNORMAL LOW (ref 0.9–3.3)

## 2012-12-30 LAB — COMPREHENSIVE METABOLIC PANEL (CC13)
ALT: 22 U/L (ref 0–55)
Albumin: 3.1 g/dL — ABNORMAL LOW (ref 3.5–5.0)
BUN: 6.6 mg/dL — ABNORMAL LOW (ref 7.0–26.0)
CO2: 24 mEq/L (ref 22–29)
Calcium: 9.2 mg/dL (ref 8.4–10.4)
Chloride: 107 mEq/L (ref 98–109)
Creatinine: 0.7 mg/dL (ref 0.6–1.1)

## 2012-12-30 NOTE — Progress Notes (Signed)
ID: [name]   DOB: [birth year]  MR#: [id number]  NGE#:[id number]  PCP: [name]., NP GYN:  SU: [facility] OTHER MW:NUUVOZ [name], [name], [name]   HISTORY OF PRESENT ILLNESS: [name] noted a mass in her right breast mid December, and as it did not spontaneously resolve over a couple of weeks she brought it to her primary physician's attention. She was set up for diagnostic mammography and right breast ultrasonography at the breast Center 05/10/2012. (Note that the patient's most recent prior mammography had been in October 2008). The current study showed a spiculated mass in the superior subareolar portion of the right breast measuring approximately 5 cm and associated with pleomorphic calcifications. This was firm and palpable. There was right nipple retraction and skin thickening. Ultrasound confirmed an irregularly marginated hypoechoic mass measuring 3.5 cm by ultrasound, and an abnormal appearing lower right axillary lymph node measuring 2.6 cm.  Biopsies of both the breast mass and the abnormal appearing lymph node were performed 05/21/2012. Both showed an invasive ductal carcinoma, grade 2, with similar prognostic panels (the breast mass was 100% estrogen and 73% progesterone receptor positive, with an MIB-1 of 5%; the lymph node was 100% estrogen 100% progesterone receptor positive, with an MIB-1 of 20%). Both masses were HER-2 negative.  Breast MRI obtained at [facility] imaging 05/29/2012 confirmed a dominant mass in the retroareolar right breast measuring 4.4 cm maximally. There was a satellite nodule inferior and lateral to this mass, measuring 1.7 cm. There were no other masses in either breast. Aside from the previously biopsied lymph node there were other mildly enhancing level I right axillary lymph nodes which did not appear pathologic. There was no other lymphadenopathy noted. The patient's subsequent history is as detailed below.   INTERVAL  HISTORY: [name] returns today accompanied by her husband [name], for followup of her breast cancer and nutritional disturbance. She completed her radiation treatments since her last visit here. She also removed her PEG tube in July.  REVIEW OF SYSTEMS: The eating disturbance has completely resolved. She tells me her sense of taste is "good". Just no trouble swallowing. She is minimal, rare heartburn episodes. She's gained some weight and I have encouraged her to keep the current weight that she has. She does have some skin peeling from the radiation which is limiting her, and so she is now doing much in terms of housework. I am encouraging her to start an exercise program and this will have to be discussed further at the next visit. Otherwise a detailed review of systems today was remarkably benign  PAST MEDICAL HISTORY: Past Medical History  Diagnosis Date  . Depression   . DVT (deep venous thrombosis)     hx of on HRT left leg ~2006  . GERD (gastroesophageal reflux disease)   . Hyperlipidemia   . Hypertension   . Hypothyroidism   . PPD positive, treated     rx inh   . OSA on CPAP   . Diabetes mellitus   . 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 Breast cancer     a. 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 DJD (degenerative joint disease) of lumbar spine   . Chronic diastolic CHF (congestive heart failure)     a. 08/2012 Echo: EF 55-60%, no rwma, mod MR.  [name] A-fib     a. on amio  for rate control/coumadin.  [name] Physical deconditioning   . Allergy   . Anemia     low iron hx  . Blood transfusion without reported diagnosis     2 u prbc  . Cataract     removed ou    PAST SURGICAL HISTORY: Past Surgical History  Procedure Laterality Date  . Tubal ligation    . Cholecystectomy    . Foot surgery    . Eye surgery    . Cataract extraction    . Percutaneous  liver biopsy    . Breast biopsy      left breast  . Mastectomy modified radical  06/08/2012    Procedure: MASTECTOMY MODIFIED RADICAL;  Surgeon: Mariella Saa, MD;  Location: [facility] OR;  Service: General;  Laterality: Right;  . Portacath placement  06/08/2012    Procedure: INSERTION PORT-A-CATH;  Surgeon: Mariella Saa, MD;  Location: [facility] OR;  Service: General;  Laterality: Left;  . Peg tube placement      FAMILY HISTORY Family History  Problem Relation Age of Onset  . Diabetes Mother   . Hypertension Mother   . Arthritis Mother   . Heart disease Mother   . Heart failure Mother   . Other Mother     benign breast mass  . Stroke Father   . Heart disease Father   . Diabetes Paternal Grandmother   . Colon cancer Paternal Grandfather    the patient's father died in his 60s with a history of dementia. He had had prior strokes. The patient's mother died in her 77s, with a history of congestive heart failure. Amee had no brothers, one sister. There is no history of breast or ovarian cancer in the family.  GYNECOLOGIC HISTORY: Menarche age 56, first live birth age 54, she is GX P2, menopause approximately 15 years ago, on hormone replacement until 2010.  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 Her husband [name] works for [employer]. Daughter [name] is a Scientist, forensic and lives in [place]. Daughter [name] and her family (husband and 2 children aged 5 and 1-1/2 years) currently live with the patient. [name] is a member of a [employer].   ADVANCED DIRECTIVES: Not in place  HEALTH MAINTENANCE: History  Substance Use Topics  . Smoking status: Former Smoker -- 1.00 packs/day for 1 years  . Smokeless tobacco: Never Used  . Alcohol Use: No     Colonoscopy: Never  PAP: Does not recall  Bone density: Never  Lipid panel:  Allergies  Allergen Reactions  . Olmesartan Medoxomil Cough     REACTION: ? if cough  . Tetracycline Hcl     Unknown reaction, too long for patient to remember   . Venlafaxine     REACTION: severe dry moouth  . Adhesive [Tape] Rash    Current Outpatient Prescriptions  Medication Sig Dispense Refill  . Alum & Mag Hydroxide-Simeth (MAGIC MOUTHWASH W/LIDOCAINE) SOLN Take 5 mLs by mouth as needed.      [name] antiseptic oral rinse (BIOTENE) LIQD 15 mLs by Mouth Rinse route 2 (two) times daily as needed.  100 mL  3  . buPROPion (WELLBUTRIN SR) 150 MG 12 hr tablet Take 150 mg by mouth every morning.       . cholestyramine (QUESTRAN) 4 G packet Take 1 packet (4 g total) by mouth daily as needed. For diarrhea.  60 each  3  . clindamycin (CLEOCIN) 300  MG capsule Take 1 capsule (300 mg total) by mouth 4 (four) times daily. X 7 days  28 capsule  0  . clonazePAM (KLONOPIN) 0.5 MG tablet Take 0.5 tablets (0.25 mg total) by mouth 2 (two) times daily. For anxiety  60 tablet  2  . escitalopram (LEXAPRO) 10 MG tablet Take 1 tablet (10 mg total) by mouth at bedtime.  31 tablet  0  . furosemide (LASIX) 80 MG tablet Take 40 mg by mouth 2 (two) times daily.      . hyaluronate sodium (RADIAPLEXRX) GEL Apply topically 2 (two) times daily.      [name] letrozole (FEMARA) 2.5 MG tablet Take 1 tablet (2.5 mg total) by mouth daily.  30 tablet  3  . levothyroxine (SYNTHROID, LEVOTHROID) 125 MCG tablet Take 1 tablet (125 mcg total) by mouth daily before breakfast.  30 tablet  2  . lidocaine-prilocaine (EMLA) cream Apply 1 application topically as needed (for port access).      [name] loperamide (IMODIUM) 2 MG capsule Take 1 capsule (2 mg total) by mouth 3 (three) times daily before meals.  30 capsule  0  . metoprolol tartrate (LOPRESSOR) 25 MG tablet Take 1 tablet (25 mg total) by mouth 2 (two) times daily.  60 tablet  2  . non-metallic deodorant (ALRA) MISC Apply 1 application topically daily as needed.      . Nutritional Supplements (FEEDING SUPPLEMENT, OSMOLITE 1.5 CAL,) LIQD Place 237 mLs into  feeding tube 2 (two) times daily after a meal. Breakfast and lunch.    0  . OLANZapine (ZYPREXA) 2.5 MG tablet Take 1 tablet (2.5 mg total) by mouth at bedtime. To help with sleep/anxiety.  30 tablet  2  . ondansetron (ZOFRAN ODT) 4 MG disintegrating tablet Take 1 tablet (4 mg total) by mouth every 8 (eight) hours as needed for nausea.  20 tablet  0  . potassium chloride (K-DUR) 10 MEQ tablet Take 10 mEq by mouth 2 (two) times daily.      [name] saccharomyces boulardii (FLORASTOR) 250 MG capsule Take 1 capsule (250 mg total) by mouth 2 (two) times daily.  60 capsule  1  . sodium chloride 0.9 % injection 10-40 mLs by Intracatheter route as needed (flush).  5 mL  3  . spironolactone (ALDACTONE) 25 MG tablet Take 1 tablet (25 mg total) by mouth daily. diuretic  30 tablet  2  . warfarin (COUMADIN) 3 MG tablet Take 6-9 mg by mouth daily. 9 mg on Monday and Friday & 6 mg on all other days.      . Water For Irrigation, Sterile (FREE WATER) SOLN Place 160 mLs into feeding tube 4 (four) times daily - after meals and at bedtime.       No current facility-administered medications for this visit.    OBJECTIVE: Middle-aged white woman who appears mildly fatigued Filed Vitals:   12/30/12 1017  BP: 130/77  Pulse: 72  Temp: 97.7 F (36.5 C)  Resp: 19     Body mass index is 31.41 kg/(m^2).    ECOG FS: 1 Filed Weights   12/30/12 1017  Weight: 183 lb 1 oz (83.037 kg)   Sclerae unicteric, pupils equal round and reactive Oropharynx clear No cervical or supraclavicular adenopathy Lungs no rales or rhonchi, good excursion bilaterally Heart regular rate and rhythm Abd soft, nontender, positive bowel sounds. The patient has been removed. Scar is unremarkable. MSK no focal spinal tenderness, no peripheral edema Neuro: nonfocal, well oriented, positive affect Breasts:  The right breast is status post mastectomy and radiation. There is desquamation in at least 2 places, and of course significant erythema. The right  axilla is benign. Left breast is unremarkable.  LAB RESULTS: Lab Results  Component Value Date   WBC 4.3 12/30/2012   NEUTROABS 3.0 12/30/2012   HGB 11.1* 12/30/2012   HCT 33.1* 12/30/2012   MCV 84.6 12/30/2012   PLT 152 12/30/2012      Chemistry      Component Value Date/Time   NA 139 10/29/2012 1058   NA 136 10/15/2012 1151   K 4.0 10/29/2012 1058   K 4.1 10/15/2012 1151   CL 104 10/29/2012 1058   CL 103 10/15/2012 1151   CO2 26 10/29/2012 1058   CO2 28 10/15/2012 1151   BUN 11.6 10/29/2012 1058   BUN 12 10/15/2012 1151   CREATININE 0.6 10/29/2012 1058   CREATININE 0.6 10/15/2012 1151   CREATININE 1.30* 07/23/2012 1342      Component Value Date/Time   CALCIUM 9.4 10/29/2012 1058   CALCIUM 8.8 10/15/2012 1151   ALKPHOS 155* 10/29/2012 1058   ALKPHOS 149* 10/15/2012 1151   AST 45* 10/29/2012 1058   AST 64* 10/15/2012 1151   ALT 27 10/29/2012 1058   ALT 39* 10/15/2012 1151   BILITOT 0.46 10/29/2012 1058   BILITOT 0.6 10/15/2012 1151       Lab Results  Component Value Date   LABCA2 49* 06/04/2012    STUDIES: No results found.  ASSESSMENT: 57 y.o. [name]   (1)  status post right mastectomy under the care of Dr. Johna Sheriff on 06/08/2012 for a pT3, pN1a, stage IIIA invasive ductal carcinoma, grade 2,  estrogen and progesterone receptor positive at 100%, HER-2/neu negative, with MIB-1 of 20%.    (2)  treated in the adjuvant setting with docetaxel/cyclophosphamide given every 3 weeks. There were multiple and severe complications, and the  patient tolerated only two cycles, last dose 07/29/2012  (3) letrozole started 08/16/2012  (3) postmastectomy radiation completed 12/24/2012  (4)  comorbidities include diabetes, hypertension, chronic liver disease with cirrhosis and fatty liver, and nutritional disturbance with temporarily dependence on PEG feeds (discontinued July 2014).   PLAN: [name] has completed her radiation. Her skin is going to be healing fairly fast over the next few weeks. I  have made her an appointment with Dr. Johna Sheriff for sometime in September to get her port removed.  The whole problem with feeding has resolved. Her weight currently is excellent and I urged her to try to keep it stable from this point.  She has lost her insurance, but will have a new insurance beginning in September. There may be a slight gap in drug coverage there. I have strongly urged him to not discontinue any of her psychotropic drugs, since she is doing so well now and the last thing we want to do is get back to the situation we have before.  She is going to have a left mammogram in October and she will see [name] shortly after that. If all goes well the plan will be for [name] to see her in 6 months thereafter. She knows to call for any problems that may develop before the next visit.  [name] C    12/30/2012

## 2012-12-30 NOTE — Addendum Note (Signed)
Addended by: Lorenza Evangelist A on: 12/30/2012 05:28 PM   Modules accepted: Orders

## 2012-12-31 ENCOUNTER — Ambulatory Visit: Payer: 59 | Admitting: Physician Assistant

## 2012-12-31 ENCOUNTER — Other Ambulatory Visit: Payer: 59 | Admitting: Lab

## 2013-01-12 ENCOUNTER — Ambulatory Visit (INDEPENDENT_AMBULATORY_CARE_PROVIDER_SITE_OTHER): Payer: PRIVATE HEALTH INSURANCE | Admitting: Family

## 2013-01-12 ENCOUNTER — Encounter: Payer: Self-pay | Admitting: Family

## 2013-01-12 VITALS — BP 149/86 | HR 84 | Temp 98.3°F | Resp 18 | Wt 187.0 lb

## 2013-01-12 DIAGNOSIS — H8110 Benign paroxysmal vertigo, unspecified ear: Secondary | ICD-10-CM

## 2013-01-12 DIAGNOSIS — E119 Type 2 diabetes mellitus without complications: Secondary | ICD-10-CM

## 2013-01-12 MED ORDER — MECLIZINE HCL 25 MG PO TABS: 25.0000 mg | ORAL_TABLET | Freq: Three times a day (TID) | ORAL | Status: DC | PRN

## 2013-01-12 NOTE — Assessment & Plan Note (Signed)
Obtain A1C, if above goal, plan to resume onglyza.

## 2013-01-12 NOTE — Progress Notes (Signed)
Subjective:    Patient ID: [name], female    DOB: 01/04/1956, 57 y.o.   MRN: [id number]  HPI  [name] is a 57 yr old female who presents today with chief complaint of dizziness. Notes dizziness with standing which started this AM. She reports that the room moves if she turns her head turns.  Reports that this feels similar to her previous hx of vertigo episodes. She does report a dry cough but no fever.    Hyperglycemia- she reports fasting glucose this AM was 175.  She has been off of onlyza since her hospitalization.     Review of Systems She denies sob or LE edema.  Denies CP.    Past Medical History  Diagnosis Date  . Depression   . DVT (deep venous thrombosis)     hx of on HRT left leg ~2006  . GERD (gastroesophageal reflux disease)   . Hyperlipidemia   . Hypertension   . Hypothyroidism   . PPD positive, treated     rx inh   . OSA on CPAP   . Diabetes mellitus   . 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 Breast cancer     a. 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 DJD (degenerative joint disease) of lumbar spine   . Chronic diastolic CHF (congestive heart failure)     a. 08/2012 Echo: EF 55-60%, no rwma, mod MR.  [name] A-fib     a. on amio for rate control/coumadin.  [name] Physical deconditioning   . Allergy   . Anemia     low iron hx  . Blood transfusion without reported diagnosis     2 u prbc  . Cataract     removed ou    History   Social History  . Marital Status: Married    Spouse Name: N/A    Number of Children: 2  . Years of Education: N/A   Occupational History  . SECRETARY [employer]   Social History Main Topics  . Smoking status: Former Smoker -- 1.00 packs/day for 1 years  . Smokeless tobacco: Never Used  . Alcohol Use: No  . Drug Use: No     Comment: quit age 75 only smoked as a teen 1 year  . Sexual  Activity: No   Other Topics Concern  . Not on file   Social History Narrative   Married   Works at [employer] secretary   Daily caffeine use - 2 cups a day plus a couple of sodas a day   Pt doesn't exercise regularly   G2P2   H H of 5 soon to be 6 .     Past Surgical History  Procedure Laterality Date  . Tubal ligation    . Cholecystectomy    . Foot surgery    . Eye surgery    . Cataract extraction    . Percutaneous liver biopsy    . Breast biopsy      left breast  . Mastectomy modified radical  06/08/2012    Procedure: MASTECTOMY MODIFIED RADICAL;  Surgeon: Mariella Saa, MD;  Location: [facility] OR;  Service: General;  Laterality: Right;  . Portacath placement  06/08/2012    Procedure: INSERTION PORT-A-CATH;  Surgeon: Mariella Saa, MD;  Location: [facility] OR;  Service: General;  Laterality: Left;  . Peg  tube placement      Family History  Problem Relation Age of Onset  . Diabetes Mother   . Hypertension Mother   . Arthritis Mother   . Heart disease Mother   . Heart failure Mother   . Other Mother     benign breast mass  . Stroke Father   . Heart disease Father   . Diabetes Paternal Grandmother   . Colon cancer Paternal Grandfather     Allergies  Allergen Reactions  . Olmesartan Medoxomil Cough    REACTION: ? if cough  . Tetracycline Hcl     Unknown reaction, too long for patient to remember   . Venlafaxine     REACTION: severe dry moouth  . Adhesive [Tape] Rash    Current Outpatient Prescriptions on File Prior to Visit  Medication Sig Dispense Refill  . buPROPion (WELLBUTRIN SR) 150 MG 12 hr tablet Take 150 mg by mouth every morning.       . clonazePAM (KLONOPIN) 0.5 MG tablet Take 0.5 tablets (0.25 mg total) by mouth 2 (two) times daily. For anxiety  60 tablet  2  . escitalopram (LEXAPRO) 10 MG tablet Take 1 tablet (10 mg total) by mouth at bedtime.  31 tablet  0  . furosemide (LASIX) 80 MG tablet Take 40 mg by mouth 2 (two) times daily.      .  hyaluronate sodium (RADIAPLEXRX) GEL Apply topically 2 (two) times daily.      [name] letrozole (FEMARA) 2.5 MG tablet Take 1 tablet (2.5 mg total) by mouth daily.  30 tablet  3  . levothyroxine (SYNTHROID, LEVOTHROID) 125 MCG tablet Take 1 tablet (125 mcg total) by mouth daily before breakfast.  30 tablet  2  . metoprolol tartrate (LOPRESSOR) 25 MG tablet Take 1 tablet (25 mg total) by mouth 2 (two) times daily.  60 tablet  2  . non-metallic deodorant (ALRA) MISC Apply 1 application topically daily as needed.      [name] OLANZapine (ZYPREXA) 2.5 MG tablet Take 1 tablet (2.5 mg total) by mouth at bedtime. To help with sleep/anxiety.  30 tablet  2  . potassium chloride (K-DUR) 10 MEQ tablet Take 10 mEq by mouth 2 (two) times daily.      [name] spironolactone (ALDACTONE) 25 MG tablet Take 1 tablet (25 mg total) by mouth daily. diuretic  30 tablet  2  . warfarin (COUMADIN) 3 MG tablet Take 6-9 mg by mouth daily. 9 mg on Monday and Friday & 6 mg on all other days.       No current facility-administered medications on file prior to visit.    BP 149/86  Pulse 84  Temp(Src) 98.3 F (36.8 C) (Oral)  Resp 18  Wt 187 lb 0.6 oz (84.841 kg)  BMI 32.09 kg/m2  SpO2 99%       Objective:   Physical Exam  Constitutional: She is oriented to person, place, and time. She appears well-developed and well-nourished. No distress.  HENT:  Head: Normocephalic and atraumatic.  Right Ear: Tympanic membrane and ear canal normal.  Left Ear: Tympanic membrane and ear canal normal.  Cardiovascular: Normal rate and regular rhythm.   No murmur heard. Pulmonary/Chest: Effort normal and breath sounds normal. No respiratory distress. She has no wheezes. She has no rales. She exhibits no tenderness.  Musculoskeletal: She exhibits no edema.  Neurological: She is alert and oriented to person, place, and time.  Psychiatric: She has a normal mood and affect. Her behavior is normal.  Thought content normal.          Assessment &  Plan:

## 2013-01-12 NOTE — Assessment & Plan Note (Signed)
Orthostatics negative.  Trial of meclizine prn. If no improvement, consider referral to vestibular rehab.

## 2013-01-12 NOTE — Progress Notes (Signed)
Name: [name]   MRN: [id number]  Date:  11/18/2012   DOB: February 20, 1956  Status:outpatient    DIAGNOSIS: Breast cancer.  CONSENT VERIFIED: yes   SET UP: Patient is setup supine   IMMOBILIZATION:  The following immobilization was used:Custom Moldable Pillow, breast board.   NARRATIVE: [name] underwent complex simulation and treatment planning for her 2 drain sites today. On the treatment machine I marked out a clinical field which included the drain site plus margin. She will receive a total dose of 50 gray in 25 fractions utilizing 6 MeV electrons and a 0.5 cm bolus. The plan will be prescribed to the 90% isodose line.  A block will be used for beam modification purposes.  A special port plan is requested.

## 2013-01-12 NOTE — Patient Instructions (Addendum)
Vertigo Vertigo means you feel like you or your surroundings are moving when they are not. Vertigo can be dangerous if it occurs when you are at work, driving, or performing difficult activities.  CAUSES  Vertigo occurs when there is a conflict of signals sent to your brain from the visual and sensory systems in your body. There are many different causes of vertigo, including:  Infections, especially in the inner ear.  A bad reaction to a drug or misuse of alcohol and medicines.  Withdrawal from drugs or alcohol.  Rapidly changing positions, such as lying down or rolling over in bed.  A migraine headache.  Decreased blood flow to the brain.  Increased pressure in the brain from a head injury, infection, tumor, or bleeding. SYMPTOMS  You may feel as though the world is spinning around or you are falling to the ground. Because your balance is upset, vertigo can cause nausea and vomiting. You may have involuntary eye movements (nystagmus). DIAGNOSIS  Vertigo is usually diagnosed by physical exam. If the cause of your vertigo is unknown, your caregiver may perform imaging tests, such as an MRI scan (magnetic resonance imaging). TREATMENT  Most cases of vertigo resolve on their own, without treatment. Depending on the cause, your caregiver may prescribe certain medicines. If your vertigo is related to body position issues, your caregiver may recommend movements or procedures to correct the problem. In rare cases, if your vertigo is caused by certain inner ear problems, you may need surgery. HOME CARE INSTRUCTIONS   Follow your caregiver's instructions.  Avoid driving.  Avoid operating heavy machinery.  Avoid performing any tasks that would be dangerous to you or others during a vertigo episode.  Tell your caregiver if you notice that certain medicines seem to be causing your vertigo. Some of the medicines used to treat vertigo episodes can actually make them worse in some people. SEEK  IMMEDIATE MEDICAL CARE IF:   Your medicines do not relieve your vertigo or are making it worse.  You develop problems with talking, walking, weakness, or using your arms, hands, or legs.  You develop severe headaches.  Your nausea or vomiting continues or gets worse.  You develop visual changes.  A family member notices behavioral changes.  Your condition gets worse. MAKE SURE YOU:  Understand these instructions.  Will watch your condition.  Will get help right away if you are not doing well or get worse. Document Released: 02/05/2005 Document Revised: 07/21/2011 Document Reviewed: 11/14/2010 Exit[employer] Patient Information 2014 [employer].  

## 2013-01-13 LAB — MICROALBUMIN / CREATININE URINE RATIO
Creatinine, Urine: 44.8 mg/dL
Microalb, Ur: 0.67 mg/dL (ref 0.00–1.89)

## 2013-01-13 LAB — HEMOGLOBIN A1C: Hgb A1c MFr Bld: 5.9 % — ABNORMAL HIGH (ref <5.7)

## 2013-01-14 ENCOUNTER — Ambulatory Visit (INDEPENDENT_AMBULATORY_CARE_PROVIDER_SITE_OTHER): Payer: No Typology Code available for payment source | Admitting: *Deleted

## 2013-01-14 DIAGNOSIS — I4891 Unspecified atrial fibrillation: Secondary | ICD-10-CM

## 2013-01-14 DIAGNOSIS — Z86718 Personal history of other venous thrombosis and embolism: Secondary | ICD-10-CM

## 2013-01-26 ENCOUNTER — Encounter: Payer: Self-pay | Admitting: Internal Medicine

## 2013-01-27 ENCOUNTER — Encounter: Payer: Self-pay | Admitting: Internal Medicine

## 2013-02-02 ENCOUNTER — Ambulatory Visit (INDEPENDENT_AMBULATORY_CARE_PROVIDER_SITE_OTHER): Payer: No Typology Code available for payment source | Admitting: Cardiovascular Disease

## 2013-02-02 ENCOUNTER — Ambulatory Visit (INDEPENDENT_AMBULATORY_CARE_PROVIDER_SITE_OTHER): Payer: No Typology Code available for payment source

## 2013-02-02 ENCOUNTER — Encounter: Payer: Self-pay | Admitting: Cardiovascular Disease

## 2013-02-02 VITALS — BP 142/77 | HR 89 | Ht 64.0 in | Wt 190.8 lb

## 2013-02-02 DIAGNOSIS — I5032 Chronic diastolic (congestive) heart failure: Secondary | ICD-10-CM

## 2013-02-02 DIAGNOSIS — Z23 Encounter for immunization: Secondary | ICD-10-CM

## 2013-02-02 NOTE — Progress Notes (Signed)
[name] Date of Birth  [birth year] [facility]     [place] Office  [address]    [address]   [address] [place], [place]  [address]    [place], [place]  [address] [phone]  Fax  [phone]  [phone]  Fax [phone]  Problem list: 1. Hypertension-she eats a lot of salt 2. Chronic leg edema 3. Left lower leg DVT-status post Coumadin therapy x6 months 4. Hypothyroidism 5. Diabetes mellitus 6 breast cancer 7. Atrial fibrillation 2. Chronic diastolic congestive heart failure  History of Present Illness:  [name] is a 57 year old female who presents for further evaluation of an episode of tachycardia. She had a viral illness which included nausea, vomiting, diarrhea. She was feeling very poorly. She works in the emergency room. She was found to have sinus tachycardia 155 beats a minute. She was treated with bed rest and fluids. She felt better. She has not had any further episodes of tachycardia.  She has occasional episodes of palpitations that clinically sound like premature ventricular contractions.  She takes Lasix on a daily basis because of leg edema.  The leg edema is worse in her left leg due to the DVT.  Sept. 24, 2014:  [name] has had a very rough year. She has been treated for breast cancer and is intolerant to chemotherapy. She had a very lengthy hospitalization for  failure to thrive.  She looks good today.  She only received part of her chemotherapy because of intolerance to the chemotherapy.    She is eating better.  The PEG tube has been removed.   Current Outpatient Prescriptions on File Prior to Visit  Medication Sig Dispense Refill  . buPROPion (WELLBUTRIN SR) 150 MG 12 hr tablet Take 150 mg by mouth every morning.       . clonazePAM (KLONOPIN) 0.5 MG tablet Take 0.5 tablets (0.25 mg total) by mouth 2 (two) times daily. For anxiety  60 tablet  2  . escitalopram (LEXAPRO) 10 MG tablet Take 1 tablet (10 mg total) by mouth at  bedtime.  31 tablet  0  . furosemide (LASIX) 80 MG tablet Take 40 mg by mouth 2 (two) times daily.      . hyaluronate sodium (RADIAPLEXRX) GEL Apply topically 2 (two) times daily.      [name] letrozole (FEMARA) 2.5 MG tablet Take 1 tablet (2.5 mg total) by mouth daily.  30 tablet  3  . levothyroxine (SYNTHROID, LEVOTHROID) 125 MCG tablet Take 1 tablet (125 mcg total) by mouth daily before breakfast.  30 tablet  2  . meclizine (ANTIVERT) 25 MG tablet Take 1 tablet (25 mg total) by mouth 3 (three) times daily as needed.  30 tablet  0  . metoprolol tartrate (LOPRESSOR) 25 MG tablet Take 1 tablet (25 mg total) by mouth 2 (two) times daily.  60 tablet  2  . non-metallic deodorant (ALRA) MISC Apply 1 application topically daily as needed.      [name] OLANZapine (ZYPREXA) 2.5 MG tablet Take 1 tablet (2.5 mg total) by mouth at bedtime. To help with sleep/anxiety.  30 tablet  2  . potassium chloride (K-DUR) 10 MEQ tablet Take 10 mEq by mouth 2 (two) times daily.      [name] spironolactone (ALDACTONE) 25 MG tablet Take 1 tablet (25 mg total) by mouth daily. diuretic  30 tablet  2  . warfarin (COUMADIN) 3 MG tablet Take 6-9 mg by mouth daily. 9 mg on Monday and Friday &  6 mg on all other days.       No current facility-administered medications on file prior to visit.    Allergies  Allergen Reactions  . Olmesartan Medoxomil Cough    REACTION: ? if cough  . Tetracycline Hcl     Unknown reaction, too long for patient to remember   . Venlafaxine     REACTION: severe dry moouth  . Adhesive [Tape] Rash    Past Medical History  Diagnosis Date  . Depression   . DVT (deep venous thrombosis)     hx of on HRT left leg ~2006  . GERD (gastroesophageal reflux disease)   . Hyperlipidemia   . Hypertension   . Hypothyroidism   . PPD positive, treated     rx inh   . OSA on CPAP   . Diabetes mellitus   . 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 Breast cancer     a. 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 DJD (degenerative joint disease) of lumbar spine   . Chronic diastolic CHF (congestive heart failure)     a. 08/2012 Echo: EF 55-60%, no rwma, mod MR.  [name] A-fib     a. on amio for rate control/coumadin.  [name] Physical deconditioning   . Allergy   . Anemia     low iron hx  . Blood transfusion without reported diagnosis     2 u prbc  . Cataract     removed ou    Past Surgical History  Procedure Laterality Date  . Tubal ligation    . Cholecystectomy    . Foot surgery    . Eye surgery    . Cataract extraction    . Percutaneous liver biopsy    . Breast biopsy      left breast  . Mastectomy modified radical  06/08/2012    Procedure: MASTECTOMY MODIFIED RADICAL;  Surgeon: Mariella Saa, MD;  Location: [facility] OR;  Service: General;  Laterality: Right;  . Portacath placement  06/08/2012    Procedure: INSERTION PORT-A-CATH;  Surgeon: Mariella Saa, MD;  Location: [facility] OR;  Service: General;  Laterality: Left;  . Peg tube placement      History  Smoking status  . Former Smoker -- 1.00 packs/day for 1 years  Smokeless tobacco  . Never Used    History  Alcohol Use No    Family History  Problem Relation Age of Onset  . Diabetes Mother   . Hypertension Mother   . Arthritis Mother   . Heart disease Mother   . Heart failure Mother   . Other Mother     benign breast mass  . Stroke Father   . Heart disease Father   . Diabetes Paternal Grandmother   . Colon cancer Paternal Grandfather     Reviw of Systems:  Reviewed in the HPI.  All other systems are negative.  Physical Exam: Blood pressure 142/77, pulse 89, height 5\' 4"  (1.626 m), weight 190 lb 12.8 oz (86.546 kg). General: Well developed, well nourished, in no acute distress.  Head: Normocephalic, atraumatic, sclera non-icteric, mucus membranes are moist,   Neck: Supple. Carotids are 2 + without bruits.  No JVD  Lungs: Clear bilaterally to auscultation.  Heart: regular rate.  normal  S1 S2. No murmurs, gallops or rubs.  Abdomen: Soft, non-tender, non-distended with normal bowel  sounds. No hepatomegaly. No rebound/guarding. No masses.  Msk:  Strength and tone are normal  Extremities: No clubbing or cyanosis. Trace leg edema especially in her left leg.  Neuro: Alert and oriented X 3. Moves all extremities spontaneously.  Psych:  Responds to questions appropriately with a normal affect.  ECG: 08/29/2011-Normal sinus rhythm at 90 beats a minute. Normal EKG.  Assessment / Plan:

## 2013-02-02 NOTE — Assessment & Plan Note (Signed)
She remains fairly stable. Continue with her same medications. I'll see her again in 6 months for an office visit and basic metabolic profile.

## 2013-02-02 NOTE — Assessment & Plan Note (Signed)
Stable. She seems to be in sinus rhythm on exam today.

## 2013-02-02 NOTE — Patient Instructions (Addendum)
Your physician wants you to follow-up in: 6 MONTHS  You will receive a reminder letter in the mail two months in advance. If you don't receive a letter, please call our office to schedule the follow-up appointment.   Your physician recommends that you return for lab work in: 6 MONTHS // BMET   Your physician recommends that you continue on your current medications as directed. Please refer to the Current Medication list given to you today.

## 2013-02-03 ENCOUNTER — Telehealth: Payer: Self-pay

## 2013-02-03 ENCOUNTER — Telehealth: Payer: Self-pay | Admitting: Family

## 2013-02-03 MED ORDER — CLONAZEPAM 0.5 MG PO TABS: 0.2500 mg | ORAL_TABLET | Freq: Two times a day (BID) | ORAL | Status: DC

## 2013-02-03 MED ORDER — METOPROLOL TARTRATE 25 MG PO TABS: 25.0000 mg | ORAL_TABLET | Freq: Two times a day (BID) | ORAL | Status: DC

## 2013-02-03 NOTE — Telephone Encounter (Signed)
Spoke with pt and advised her that I will send refill of metoprolol today.  Please advise re: clonazepam and potassium request as pt states these were filled [facility].

## 2013-02-03 NOTE — Telephone Encounter (Signed)
Clonazepam refilled.  Potassium denied must contact pcp.

## 2013-02-03 NOTE — Telephone Encounter (Signed)
Patient states that she needs refills of 3 medications sent to [facility] in [place]. She is not sure of the name of the medications but says that if you call [facility] at [phone] the pharmacist would be able to tell you the name of the medications

## 2013-02-03 NOTE — Telephone Encounter (Signed)
Ok to send 30 day of potassium with 5 refills.  She should have refills on the clonazepam per chart review.

## 2013-02-03 NOTE — Telephone Encounter (Signed)
Patients husband Dorinda Hill called back regarding this. [phone]

## 2013-02-03 NOTE — Telephone Encounter (Signed)
[facility] called and said that patient wants to transfer RX of Potassium and Clonazepam, but there is no refills remaining.

## 2013-02-04 ENCOUNTER — Other Ambulatory Visit: Payer: Self-pay | Admitting: Pharmacist

## 2013-02-04 ENCOUNTER — Telehealth: Payer: Self-pay | Admitting: Family

## 2013-02-04 ENCOUNTER — Other Ambulatory Visit: Payer: Self-pay | Admitting: Nurse Practitioner

## 2013-02-04 MED ORDER — POTASSIUM CHLORIDE ER 10 MEQ PO TBCR: 10.0000 meq | EXTENDED_RELEASE_TABLET | Freq: Two times a day (BID) | ORAL | Status: DC

## 2013-02-04 MED ORDER — METOPROLOL TARTRATE 25 MG PO TABS: 25.0000 mg | ORAL_TABLET | Freq: Two times a day (BID) | ORAL | Status: DC

## 2013-02-04 MED ORDER — WARFARIN SODIUM 3 MG PO TABS: ORAL_TABLET | ORAL | Status: DC

## 2013-02-04 NOTE — Telephone Encounter (Signed)
Received call from pt's husband again that pharmacy still hadn't received refills. Called and spoke with pharmacist, Misty Stanley and gave verbal on both refills as e-transmission failed. Notified pt of rx completion.

## 2013-02-04 NOTE — Telephone Encounter (Signed)
Patient called stating [facility] in [place] says they have not received her blood pressure med refill her potassium or her klonipin.  Please check and advise patient

## 2013-02-04 NOTE — Telephone Encounter (Signed)
Refills sent, pt's husband notified.

## 2013-02-04 NOTE — Telephone Encounter (Signed)
See previous phone note.  

## 2013-02-11 ENCOUNTER — Ambulatory Visit (INDEPENDENT_AMBULATORY_CARE_PROVIDER_SITE_OTHER): Payer: No Typology Code available for payment source | Admitting: General Surgery

## 2013-02-11 ENCOUNTER — Encounter (INDEPENDENT_AMBULATORY_CARE_PROVIDER_SITE_OTHER): Payer: Self-pay | Admitting: General Surgery

## 2013-02-11 ENCOUNTER — Ambulatory Visit (INDEPENDENT_AMBULATORY_CARE_PROVIDER_SITE_OTHER): Payer: No Typology Code available for payment source | Admitting: *Deleted

## 2013-02-11 VITALS — BP 130/72 | HR 76 | Temp 98.6°F | Resp 14 | Ht 65.0 in | Wt 193.8 lb

## 2013-02-11 DIAGNOSIS — Z86718 Personal history of other venous thrombosis and embolism: Secondary | ICD-10-CM

## 2013-02-11 DIAGNOSIS — M2560 Stiffness of unspecified joint, not elsewhere classified: Secondary | ICD-10-CM

## 2013-02-11 DIAGNOSIS — R29898 Other symptoms and signs involving the musculoskeletal system: Secondary | ICD-10-CM

## 2013-02-11 DIAGNOSIS — I4891 Unspecified atrial fibrillation: Secondary | ICD-10-CM

## 2013-02-11 MED ORDER — WARFARIN SODIUM 3 MG PO TABS: ORAL_TABLET | ORAL | Status: DC

## 2013-02-11 NOTE — Progress Notes (Signed)
Chief complaint: Followup  right breast cancer  History: Patient returns for long-term followup with a history of right modified mastectomy for T2N1  cancer of the right breast. She had chemotherapy postoperatively but had severe reactions with GI side effects and cardiac toxicity that resulted in a number of hospitalizations and actually required a feeding tube. She had atrial fibrillation. She was severely ill for quite some time after receiving 24 courses. She now however is improving steadily.  She completed postoperative radiation therapy. She is ready to have her Port-A-Cath out. She denies any unusual pain or arm swelling. She still is somewhat weak but regaining strength and gradually getting back to her itself.  Exam: BP 130/72  Pulse 76  Temp(Src) 98.6 F (37 C) (Temporal)  Resp 14  Ht 5\' 5"  (1.651 m)  Wt 193 lb 12.8 oz (87.907 kg)  BMI 32.25 kg/m2 General: Mildly chronically ill but alert in no distress Lymph nodes: No cervical, supraclavicular or axillary nodes palpable Lungs: Clear equal breath sounds bilaterally Breasts: Right chest wall well-healed with mild to moderate postradiation changes. No subcutaneous or skin masses. Left breast is negative to exam Extremities: She can extend her right shoulder to only just past horizontal.  Assessment and plan: History of breast cancer and right modified mastectomy, chemoradiation as above. Severe chemotherapy toxicity from which she is still recovering. She is ready to have her Port-A-Cath out and we'll get this scheduled for her. She is now on Coumadin due to afebrile. His 3 days ahead of time. She has some degree of mobility of her right shoulder and going to have her see physical therapy for this. She has a prosthesis is working well for her. I will see her back for cancer followup in 6 months.

## 2013-02-16 ENCOUNTER — Telehealth (INDEPENDENT_AMBULATORY_CARE_PROVIDER_SITE_OTHER): Payer: Self-pay | Admitting: General Surgery

## 2013-02-16 ENCOUNTER — Telehealth: Payer: Self-pay | Admitting: *Deleted

## 2013-02-16 NOTE — Telephone Encounter (Signed)
Spoke with pt and she is aware that she is ok to hold coumadin for three days.

## 2013-02-16 NOTE — Telephone Encounter (Signed)
Spoke with pt and informed her that she is scheduled for PT on 03/02/13 at 3:00.  Address was given and patient confirmed that this appt was okay.

## 2013-02-16 NOTE — Telephone Encounter (Signed)
Message copied by Yeimy Brabant, Franchot Mimes on Wed Feb 16, 2013  2:46 PM ------      Message from: Vesta Mixer      Created: Wed Feb 16, 2013  1:41 PM      Regarding: RE: Portacath removal        [place] may hold her coumadin for 3 days for portacath removal.                  ----- Message -----         From: Raul Del, RN         Sent: 02/16/2013   9:32 AM           To: Vesta Mixer, MD, Antony Odea, RN      Subject: Portacath removal                                        Pt having Portacath removed on 02/23/13 by Dr Johna Sheriff, he wants her off for 3 days prior, is she cleared to hold her coumadin?        ------

## 2013-02-17 ENCOUNTER — Telehealth: Payer: Self-pay | Admitting: *Deleted

## 2013-02-17 NOTE — Telephone Encounter (Signed)
Message copied by Antony Odea on Thu Feb 17, 2013 10:14 AM ------      Message from: MUSE, TIFFANY J      Created: Wed Feb 16, 2013  9:32 AM      Regarding: Portacath removal        Pt having Portacath removed on 02/23/13 by Dr Johna Sheriff, he wants her off for 3 days prior, is she cleared to hold her coumadin?  ------

## 2013-02-17 NOTE — Telephone Encounter (Signed)
Per Dr Elease Hashimoto, pt may hold coumadin for 3 days prior to procedure.

## 2013-02-18 MED ORDER — WARFARIN SODIUM 3 MG PO TABS: ORAL_TABLET | ORAL | Status: DC

## 2013-02-23 DIAGNOSIS — Z452 Encounter for adjustment and management of vascular access device: Secondary | ICD-10-CM

## 2013-03-02 ENCOUNTER — Ambulatory Visit: Payer: No Typology Code available for payment source | Attending: General Surgery | Admitting: Physical Therapy

## 2013-03-02 DIAGNOSIS — M24519 Contracture, unspecified shoulder: Secondary | ICD-10-CM | POA: Insufficient documentation

## 2013-03-02 DIAGNOSIS — Z9010 Acquired absence of unspecified breast and nipple: Secondary | ICD-10-CM | POA: Insufficient documentation

## 2013-03-02 DIAGNOSIS — E119 Type 2 diabetes mellitus without complications: Secondary | ICD-10-CM | POA: Insufficient documentation

## 2013-03-02 DIAGNOSIS — IMO0001 Reserved for inherently not codable concepts without codable children: Secondary | ICD-10-CM | POA: Insufficient documentation

## 2013-03-02 DIAGNOSIS — I10 Essential (primary) hypertension: Secondary | ICD-10-CM | POA: Insufficient documentation

## 2013-03-02 DIAGNOSIS — E039 Hypothyroidism, unspecified: Secondary | ICD-10-CM | POA: Insufficient documentation

## 2013-03-02 DIAGNOSIS — M259 Joint disorder, unspecified: Secondary | ICD-10-CM | POA: Insufficient documentation

## 2013-03-02 DIAGNOSIS — C50919 Malignant neoplasm of unspecified site of unspecified female breast: Secondary | ICD-10-CM | POA: Insufficient documentation

## 2013-03-04 ENCOUNTER — Telehealth: Payer: Self-pay | Admitting: *Deleted

## 2013-03-04 ENCOUNTER — Other Ambulatory Visit: Payer: Self-pay

## 2013-03-04 ENCOUNTER — Encounter: Payer: Self-pay | Admitting: *Deleted

## 2013-03-04 ENCOUNTER — Other Ambulatory Visit: Payer: Self-pay | Admitting: Oncology

## 2013-03-04 ENCOUNTER — Other Ambulatory Visit: Payer: Self-pay | Admitting: Family

## 2013-03-04 ENCOUNTER — Ambulatory Visit
Admission: RE | Admit: 2013-03-04 | Discharge: 2013-03-04 | Disposition: A | Discharge status: Home / Self Care | Payer: No Typology Code available for payment source | Source: Ambulatory Visit | Attending: Oncology | Admitting: Oncology

## 2013-03-04 DIAGNOSIS — C50911 Malignant neoplasm of unspecified site of right female breast: Secondary | ICD-10-CM

## 2013-03-04 DIAGNOSIS — C50919 Malignant neoplasm of unspecified site of unspecified female breast: Secondary | ICD-10-CM

## 2013-03-04 DIAGNOSIS — Z9011 Acquired absence of right breast and nipple: Secondary | ICD-10-CM

## 2013-03-04 MED ORDER — LETROZOLE 2.5 MG PO TABS: 2.5000 mg | ORAL_TABLET | Freq: Every day | ORAL | Status: DC

## 2013-03-04 MED ORDER — METOPROLOL TARTRATE 25 MG PO TABS: 25.0000 mg | ORAL_TABLET | Freq: Two times a day (BID) | ORAL | Status: DC

## 2013-03-04 MED ORDER — SPIRONOLACTONE 25 MG PO TABS: 25.0000 mg | ORAL_TABLET | Freq: Every day | ORAL | Status: DC

## 2013-03-04 MED ORDER — ESCITALOPRAM OXALATE 10 MG PO TABS: 10.0000 mg | ORAL_TABLET | Freq: Every day | ORAL | Status: DC

## 2013-03-04 NOTE — Telephone Encounter (Signed)
Received call from pt's husband requesting refills on: spironolactone, lexapro, zyprexa, metoprolol and letrozole. Advised spouse we cannot manage letrozole and pt should contact her oncologist for that refill. He voices understanding. Advised him zyprexa refill has already been sent and I am sending refills on additional medications.

## 2013-03-04 NOTE — Telephone Encounter (Signed)
[facility] request for refill on Zyprexa Last filled - 07.17.14, #30x2 Last AEX - 09.03.14 Next AEX - 3 Months Please Advise/SLS

## 2013-03-04 NOTE — Progress Notes (Signed)
RECEIVED A FAX FROM [facility] CONCERNING A PRIOR AUTHORIZATION FOR LETROZOLE. THIS REQUEST WAS PLACED IN THE MANAGED CARE BIN. 

## 2013-03-04 NOTE — Progress Notes (Signed)
NOTIFIED PT.'S HUSBAND CONCERNING THE NEED FOR A PRIOR AUTHORIZATION. HE VOICES UNDERSTANDING.

## 2013-03-07 ENCOUNTER — Encounter: Payer: Self-pay | Admitting: Oncology

## 2013-03-07 ENCOUNTER — Ambulatory Visit: Payer: No Typology Code available for payment source

## 2013-03-07 NOTE — Progress Notes (Signed)
Faxed letrazole pa form to [facility].

## 2013-03-08 ENCOUNTER — Encounter: Payer: Self-pay | Admitting: Oncology

## 2013-03-08 NOTE — Progress Notes (Signed)
[employer] approved letrozole from 03/07/13-03/07/16.

## 2013-03-11 ENCOUNTER — Other Ambulatory Visit: Payer: Self-pay | Admitting: Physician Assistant

## 2013-03-11 ENCOUNTER — Ambulatory Visit (HOSPITAL_COMMUNITY)
Admission: RE | Admit: 2013-03-11 | Discharge: 2013-03-11 | Disposition: A | Discharge status: Home / Self Care | Payer: No Typology Code available for payment source | Source: Ambulatory Visit | Attending: Physician Assistant | Admitting: Physician Assistant

## 2013-03-11 ENCOUNTER — Ambulatory Visit (HOSPITAL_BASED_OUTPATIENT_CLINIC_OR_DEPARTMENT_OTHER): Payer: No Typology Code available for payment source | Admitting: Physician Assistant

## 2013-03-11 ENCOUNTER — Encounter: Payer: Self-pay | Admitting: Physician Assistant

## 2013-03-11 ENCOUNTER — Other Ambulatory Visit (HOSPITAL_BASED_OUTPATIENT_CLINIC_OR_DEPARTMENT_OTHER): Payer: No Typology Code available for payment source | Admitting: Lab

## 2013-03-11 VITALS — BP 147/82 | HR 85 | Temp 97.9°F | Resp 20 | Ht 65.0 in | Wt 202.8 lb

## 2013-03-11 DIAGNOSIS — C50911 Malignant neoplasm of unspecified site of right female breast: Secondary | ICD-10-CM

## 2013-03-11 DIAGNOSIS — C50019 Malignant neoplasm of nipple and areola, unspecified female breast: Secondary | ICD-10-CM

## 2013-03-11 DIAGNOSIS — Z86718 Personal history of other venous thrombosis and embolism: Secondary | ICD-10-CM

## 2013-03-11 DIAGNOSIS — I10 Essential (primary) hypertension: Secondary | ICD-10-CM

## 2013-03-11 DIAGNOSIS — C50919 Malignant neoplasm of unspecified site of unspecified female breast: Secondary | ICD-10-CM

## 2013-03-11 DIAGNOSIS — D696 Thrombocytopenia, unspecified: Secondary | ICD-10-CM

## 2013-03-11 DIAGNOSIS — M25552 Pain in left hip: Secondary | ICD-10-CM

## 2013-03-11 DIAGNOSIS — M259 Joint disorder, unspecified: Secondary | ICD-10-CM | POA: Insufficient documentation

## 2013-03-11 DIAGNOSIS — Z853 Personal history of malignant neoplasm of breast: Secondary | ICD-10-CM

## 2013-03-11 DIAGNOSIS — M25559 Pain in unspecified hip: Secondary | ICD-10-CM | POA: Insufficient documentation

## 2013-03-11 DIAGNOSIS — K769 Liver disease, unspecified: Secondary | ICD-10-CM

## 2013-03-11 DIAGNOSIS — Z780 Asymptomatic menopausal state: Secondary | ICD-10-CM

## 2013-03-11 DIAGNOSIS — E119 Type 2 diabetes mellitus without complications: Secondary | ICD-10-CM

## 2013-03-11 DIAGNOSIS — Z170 Estrogen receptor positive status [ER+]: Secondary | ICD-10-CM

## 2013-03-11 LAB — CBC WITH DIFFERENTIAL/PLATELET
Basophils Absolute: 0 10*3/uL (ref 0.0–0.1)
EOS%: 2.3 % (ref 0.0–7.0)
Eosinophils Absolute: 0.1 10*3/uL (ref 0.0–0.5)
HCT: 38.4 % (ref 34.8–46.6)
LYMPH%: 18.9 % (ref 14.0–49.7)
MCH: 27.4 pg (ref 25.1–34.0)
MCHC: 32.3 g/dL (ref 31.5–36.0)
MCV: 85 fL (ref 79.5–101.0)
MONO#: 0.3 10*3/uL (ref 0.1–0.9)
MONO%: 7.1 % (ref 0.0–14.0)
NEUT#: 3.1 10*3/uL (ref 1.5–6.5)
NEUT%: 71.3 % (ref 38.4–76.8)
Platelets: 143 10*3/uL — ABNORMAL LOW (ref 145–400)
RBC: 4.53 10*6/uL (ref 3.70–5.45)
WBC: 4.4 10*3/uL (ref 3.9–10.3)

## 2013-03-11 LAB — COMPREHENSIVE METABOLIC PANEL (CC13)
Alkaline Phosphatase: 141 U/L (ref 40–150)
Anion Gap: 10 mEq/L (ref 3–11)
BUN: 7.3 mg/dL (ref 7.0–26.0)
CO2: 22 mEq/L (ref 22–29)
Creatinine: 0.8 mg/dL (ref 0.6–1.1)
Glucose: 212 mg/dl — ABNORMAL HIGH (ref 70–140)
Sodium: 140 mEq/L (ref 136–145)
Total Bilirubin: 0.48 mg/dL (ref 0.20–1.20)

## 2013-03-11 MED ORDER — HYDROCODONE-ACETAMINOPHEN 5-325 MG PO TABS: 1.0000 | ORAL_TABLET | Freq: Four times a day (QID) | ORAL | Status: DC | PRN

## 2013-03-11 NOTE — Progress Notes (Signed)
ID: [name]   DOB: Mar 17, 1956  MR#: [id number]  WUJ#:[id number]  PCP: Rhonda Fillers., NP GYN:  [name]: [name] [name]  CHIEF COMPLAINT:  Right Breast Cancer   HISTORY OF PRESENT ILLNESS: [name] noted a mass in her right breast mid December, and as it did not spontaneously resolve over a couple of weeks she brought it to her primary physician's attention. She was set up for diagnostic mammography and right breast ultrasonography at the [name] 05/10/2012. (Note that the patient's most recent prior mammography had been in October 2008). The current study showed a spiculated mass in the superior subareolar portion of the right breast measuring approximately 5 cm and associated with pleomorphic calcifications. This was firm and palpable. There was right nipple retraction and skin thickening. Ultrasound confirmed an irregularly marginated hypoechoic mass measuring 3.5 cm by ultrasound, and an abnormal appearing lower right axillary lymph node measuring 2.6 cm.  Biopsies of both the breast mass and the abnormal appearing lymph node were performed 05/21/2012. Both showed an invasive ductal carcinoma, grade 2, with similar prognostic panels (the breast mass was 100% estrogen and 73% progesterone receptor positive, with an MIB-1 of 5%; the lymph node was 100% estrogen 100% progesterone receptor positive, with an MIB-1 of 20%). Both masses were HER-2 negative.  Breast MRI obtained at [name] 05/29/2012 confirmed a dominant mass in the retroareolar right breast measuring 4.4 cm maximally. There was a satellite nodule inferior and lateral to this mass, measuring 1.7 cm. There were no Rhonda masses in either breast. Aside from the previously biopsied lymph node there were Rhonda mildly enhancing level I right axillary lymph nodes which did not appear pathologic. There was no Rhonda lymphadenopathy noted.   The  patient's subsequent history is as detailed below.   INTERVAL HISTORY: [name] returns today accompanied by her husband [name], for followup of her right breast cancer. Interval history is generally unremarkable, and [name] continues to recover from her recent health issues. She continues on letrozole with good tolerance. She's having no significant hot flashes. She does have some arthritis but has noted no increased joint pain, with the exception of some pain in the left hip. This does not seem to be associated with the letrozole, however. The left hip is painful most of the time, and this is affecting her ability to walk and exercise. She does have a history of arthritis in the knees which is treated by [name] with cortisone injections.   REVIEW OF SYSTEMS: [name] has had no recent illnesses and denies any fevers or chills. She's had no skin changes and denies any abnormal bruising or bleeding. Her energy level is fair. It is improving. Her appetite is good, and she denies any nausea or change in bowel or bladder habits. She has occasional heartburn for which she utilizes omeprazole as needed. She denies any increased cough, shortness of breath, chest pain, palpitations. She's had no abnormal headaches, dizziness, or change in vision. She denies any peripheral swelling.  A detailed review of systems is otherwise stable and noncontributory.   PAST MEDICAL HISTORY: Past Medical History  Diagnosis Date  . Depression   . DVT (deep venous thrombosis)     hx of on HRT left leg ~2006  . GERD (gastroesophageal reflux disease)   . Hyperlipidemia   . Hypertension   . Hypothyroidism   . PPD positive, treated     rx inh   . OSA on CPAP   .  Diabetes mellitus   . 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 Breast cancer     a. 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 DJD  (degenerative joint disease) of lumbar spine   . Chronic diastolic CHF (congestive heart failure)     a. 08/2012 Echo: EF 55-60%, no rwma, mod MR.  [name] A-fib     a. on amio for rate control/coumadin.  [name] Physical deconditioning   . Allergy   . Anemia     low iron hx  . Blood transfusion without reported diagnosis     2 u prbc  . Cataract     removed ou    PAST SURGICAL HISTORY: Past Surgical History  Procedure Laterality Date  . Tubal ligation    . Cholecystectomy    . Foot surgery    . Eye surgery    . Cataract extraction    . Percutaneous liver biopsy    . Breast biopsy      left breast  . Mastectomy modified radical  06/08/2012    Procedure: MASTECTOMY MODIFIED RADICAL;  Surgeon: Rhonda Saa, MD;  Location: [facility] OR;  Service: General;  Laterality: Right;  . Portacath placement  06/08/2012    Procedure: INSERTION PORT-A-CATH;  Surgeon: Rhonda Saa, MD;  Location: [facility] OR;  Service: General;  Laterality: Left;  . Peg tube placement      FAMILY HISTORY Family History  Problem Relation Age of Onset  . Diabetes Mother   . Hypertension Mother   . Arthritis Mother   . Heart disease Mother   . Heart failure Mother   . Rhonda Mother     benign breast mass  . Stroke Father   . Heart disease Father   . Diabetes Paternal Grandmother   . Colon cancer Paternal Grandfather    the patient's father died in his 73s with a history of dementia. He had had prior strokes. The patient's mother died in her 45s, with a history of congestive heart failure. [name] had no brothers, one sister. There is no history of breast or ovarian cancer in the family.  GYNECOLOGIC HISTORY: Menarche age 59, first live birth age 47, she is GX P2, menopause approximately 15 years ago, on hormone replacement until 2010.  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 Her husband [name] works for [employer].  Daughter [name] is a Scientist, forensic and lives in [place]. Daughter [name] and her family (husband and 2 children aged 5 and 1-1/2 years) currently live with the patient. Dashayla is a member of a [employer].   ADVANCED DIRECTIVES: Not in place  HEALTH MAINTENANCE: (Updated October 2014) History  Substance Use Topics  . Smoking status: Former Smoker -- 1.00 packs/day for 1 years  . Smokeless tobacco: Never Used  . Alcohol Use: No     Colonoscopy: Never  PAP: Does not recall  Bone density: Never  Lipid panel:   Allergies  Allergen Reactions  . Olmesartan Medoxomil Cough    REACTION: ? if cough  . Tetracycline Hcl     Unknown reaction, too long for patient to remember   . Venlafaxine     REACTION: severe dry moouth  . Adhesive [Tape] Rash    Current Outpatient Prescriptions  Medication  Sig Dispense Refill  . buPROPion (WELLBUTRIN SR) 150 MG 12 hr tablet Take 150 mg by mouth every morning.       . clonazePAM (KLONOPIN) 0.5 MG tablet Take 0.5 tablets (0.25 mg total) by mouth 2 (two) times daily. For anxiety  60 tablet  2  . escitalopram (LEXAPRO) 10 MG tablet Take 1 tablet (10 mg total) by mouth at bedtime.  30 tablet  4  . furosemide (LASIX) 80 MG tablet Take 40 mg by mouth 2 (two) times daily.      . hyaluronate sodium (RADIAPLEXRX) GEL Apply topically 2 (two) times daily.      [name] HYDROcodone-acetaminophen (NORCO/VICODIN) 5-325 MG per tablet Take 1 tablet by mouth every 6 (six) hours as needed for pain.  20 tablet  0  . letrozole (FEMARA) 2.5 MG tablet Take 1 tablet (2.5 mg total) by mouth daily.  30 tablet  3  . levothyroxine (SYNTHROID, LEVOTHROID) 125 MCG tablet Take 1 tablet (125 mcg total) by mouth daily before breakfast.  30 tablet  2  . meclizine (ANTIVERT) 25 MG tablet Take 1 tablet (25 mg total) by mouth 3 (three) times daily as needed.  30 tablet  0  . metoprolol tartrate (LOPRESSOR) 25 MG tablet Take 1 tablet (25 mg total) by mouth  2 (two) times daily.  60 tablet  4  . OLANZapine (ZYPREXA) 2.5 MG tablet TAKE ONE TABLET BY MOUTH ONCE DAILY AT BEDTIME TO  HELP  WITH  SLEEP  OR  ANXIETY  45 tablet  3  . potassium chloride (K-DUR) 10 MEQ tablet Take 1 tablet (10 mEq total) by mouth 2 (two) times daily.  60 tablet  5  . spironolactone (ALDACTONE) 25 MG tablet Take 1 tablet (25 mg total) by mouth daily. diuretic  30 tablet  4  . warfarin (COUMADIN) 3 MG tablet Take as directed by Anticoagulation clinic  70 tablet  4   No current facility-administered medications for this visit.    OBJECTIVE: Middle-aged white woman who appears mildly fatigued Filed Vitals:   03/11/13 0918  BP: 147/82  Pulse: 85  Temp: 97.9 F (36.6 C)  Resp: 20     Body mass index is 33.75 kg/(m^2).    ECOG FS: 1 Filed Weights   03/11/13 0918  Weight: 202 lb 12.8 oz (91.989 kg)   Physical Exam: HEENT:  Sclerae anicteric.  Oropharynx clear. Poor dentition. Buccal mucosa is pink and moist. NODES:  No cervical or supraclavicular lymphadenopathy palpated.  BREAST EXAM: Status post right mastectomy with well-healed incision, no suspicious nodularity, no evidence of local recurrence. Left breast is unremarkable. Axillae are benign bilaterally, no palpable lymphadenopathy. LUNGS:  Clear to auscultation bilaterally.  No wheezes or rhonchi HEART:  Regular rate and rhythm. No murmur  ABDOMEN:  Soft, obese, nontender.  Positive bowel sounds.  MSK:  No focal spinal tenderness to palpation. Slight limitation in range of motion of the right upper extremity. Full range of motion in the left upper extremity.  EXTREMITIES:  No peripheral edema.   NEURO:  Nonfocal. Well oriented.  Positive affect.    LAB RESULTS: Lab Results  Component Value Date   WBC 4.4 03/11/2013   NEUTROABS 3.1 03/11/2013   HGB 12.4 03/11/2013   HCT 38.4 03/11/2013   MCV 85.0 03/11/2013   PLT 143* 03/11/2013      Chemistry      Component Value Date/Time   NA 140 03/11/2013 0907    NA 136 10/15/2012 1151  K 4.1 03/11/2013 0907   K 4.1 10/15/2012 1151   CL 104 10/29/2012 1058   CL 103 10/15/2012 1151   CO2 22 03/11/2013 0907   CO2 28 10/15/2012 1151   BUN 7.3 03/11/2013 0907   BUN 12 10/15/2012 1151   CREATININE 0.8 03/11/2013 0907   CREATININE 0.6 10/15/2012 1151   CREATININE 1.30* 07/23/2012 1342      Component Value Date/Time   CALCIUM 9.2 03/11/2013 0907   CALCIUM 8.8 10/15/2012 1151   ALKPHOS 141 03/11/2013 0907   ALKPHOS 149* 10/15/2012 1151   AST 34 03/11/2013 0907   AST 64* 10/15/2012 1151   ALT 24 03/11/2013 0907   ALT 39* 10/15/2012 1151   BILITOT 0.48 03/11/2013 0907   BILITOT 0.6 10/15/2012 1151       STUDIES:  Left mammogram and bone density are both scheduled for December 2014.   ASSESSMENT: 57 y.o. [name]   (1)  status post right mastectomy under the care of Dr. Johna Sheriff on 06/08/2012 for a pT3, pN1a, stage IIIA invasive ductal carcinoma, grade 2,  estrogen and progesterone receptor positive at 100%, HER-2/neu negative, with MIB-1 of 20%.    (2)  treated in the adjuvant setting with docetaxel/cyclophosphamide given every 3 weeks. There were multiple and severe complications, and the  patient tolerated only two cycles, last dose 07/29/2012  (3) letrozole started 08/16/2012  (3) postmastectomy radiation completed 12/24/2012  (4)  comorbidities include diabetes, hypertension, chronic liver disease with cirrhosis and fatty liver, and nutritional disturbance with temporarily dependence on PEG feeds (discontinued July 2014).   PLAN: Shalaina appears to be doing very well with regards to her breast cancer. There is no clinical evidence of disease recurrence, and she is tolerating the letrozole well which she will continue. She's already scheduled for her left screening mammogram in December. She has never had a bone density, and we will add that to be obtained on the same day.  I am going to send her for a plain film x-ray of the left hip today to  evaluate her increased hip pain. Until we can find out what is going on, I have given her a one time prescription for hydrocodone/APAP, 5/325 mg, one tablet every 6 hours as needed for pain, dispense #20 with no refills. Depending on the results of the x-ray, we will likely refer her back to her orthopedist, [name], for further evaluation.   Tyrika is having some difficulty with her insurance company paying for the letrozole, and we are referring her to manage care to see if there is any assistance available. Otherwise, we will plan on seeing her back for routine labs and physical exam in 6 months. She and Don both voice understanding and agreement with this plan, and will call with any changes or problems prior to her next scheduled appointment.    Abdulmalik Darco PA-C     03/11/2013

## 2013-03-14 ENCOUNTER — Telehealth: Payer: Self-pay | Admitting: Oncology

## 2013-03-14 ENCOUNTER — Other Ambulatory Visit: Payer: Self-pay | Admitting: Physician Assistant

## 2013-03-14 ENCOUNTER — Ambulatory Visit: Payer: No Typology Code available for payment source | Attending: General Surgery

## 2013-03-14 DIAGNOSIS — IMO0001 Reserved for inherently not codable concepts without codable children: Secondary | ICD-10-CM | POA: Insufficient documentation

## 2013-03-14 DIAGNOSIS — M259 Joint disorder, unspecified: Secondary | ICD-10-CM | POA: Insufficient documentation

## 2013-03-14 DIAGNOSIS — E119 Type 2 diabetes mellitus without complications: Secondary | ICD-10-CM | POA: Insufficient documentation

## 2013-03-14 DIAGNOSIS — I10 Essential (primary) hypertension: Secondary | ICD-10-CM | POA: Insufficient documentation

## 2013-03-14 DIAGNOSIS — E039 Hypothyroidism, unspecified: Secondary | ICD-10-CM | POA: Insufficient documentation

## 2013-03-14 DIAGNOSIS — M24519 Contracture, unspecified shoulder: Secondary | ICD-10-CM | POA: Insufficient documentation

## 2013-03-14 DIAGNOSIS — Z9010 Acquired absence of unspecified breast and nipple: Secondary | ICD-10-CM | POA: Insufficient documentation

## 2013-03-14 DIAGNOSIS — C50919 Malignant neoplasm of unspecified site of unspecified female breast: Secondary | ICD-10-CM | POA: Insufficient documentation

## 2013-03-18 ENCOUNTER — Ambulatory Visit (HOSPITAL_COMMUNITY)
Admission: RE | Admit: 2013-03-18 | Discharge: 2013-03-18 | Disposition: A | Discharge status: Home / Self Care | Payer: No Typology Code available for payment source | Source: Ambulatory Visit | Attending: Physician Assistant | Admitting: Physician Assistant

## 2013-03-18 DIAGNOSIS — M25552 Pain in left hip: Secondary | ICD-10-CM

## 2013-03-18 DIAGNOSIS — M25559 Pain in unspecified hip: Secondary | ICD-10-CM | POA: Insufficient documentation

## 2013-03-18 DIAGNOSIS — M1610 Unilateral primary osteoarthritis, unspecified hip: Secondary | ICD-10-CM | POA: Insufficient documentation

## 2013-03-18 DIAGNOSIS — C50919 Malignant neoplasm of unspecified site of unspecified female breast: Secondary | ICD-10-CM

## 2013-03-18 DIAGNOSIS — M169 Osteoarthritis of hip, unspecified: Secondary | ICD-10-CM | POA: Insufficient documentation

## 2013-03-18 DIAGNOSIS — Z853 Personal history of malignant neoplasm of breast: Secondary | ICD-10-CM | POA: Insufficient documentation

## 2013-03-18 MED ORDER — GADOBENATE DIMEGLUMINE 529 MG/ML IV SOLN
20.0000 mL | Freq: Once | INTRAVENOUS | Status: AC | PRN
  Administered 2013-03-18: 20 mL via INTRAVENOUS

## 2013-03-21 ENCOUNTER — Ambulatory Visit: Payer: No Typology Code available for payment source | Admitting: Physical Therapy

## 2013-03-21 ENCOUNTER — Other Ambulatory Visit: Payer: Self-pay | Admitting: Physician Assistant

## 2013-03-21 DIAGNOSIS — M169 Osteoarthritis of hip, unspecified: Secondary | ICD-10-CM

## 2013-03-21 DIAGNOSIS — M25552 Pain in left hip: Secondary | ICD-10-CM

## 2013-03-22 ENCOUNTER — Other Ambulatory Visit: Payer: Self-pay

## 2013-03-22 ENCOUNTER — Telehealth: Payer: Self-pay | Admitting: Family

## 2013-03-22 ENCOUNTER — Telehealth: Payer: Self-pay | Admitting: Oncology

## 2013-03-22 MED ORDER — LEVOTHYROXINE SODIUM 125 MCG PO TABS: 125.0000 ug | ORAL_TABLET | Freq: Every day | ORAL | Status: DC

## 2013-03-22 NOTE — Telephone Encounter (Signed)
Refill levothyroxin 125 mcg tab qty 75 take 1 tablet by mouth once daily before breakfast Last fill 02-12-2013

## 2013-03-22 NOTE — Telephone Encounter (Signed)
, °

## 2013-03-23 ENCOUNTER — Telehealth: Payer: Self-pay | Admitting: Oncology

## 2013-03-23 NOTE — Telephone Encounter (Signed)
, °

## 2013-03-28 ENCOUNTER — Ambulatory Visit (INDEPENDENT_AMBULATORY_CARE_PROVIDER_SITE_OTHER): Payer: No Typology Code available for payment source | Admitting: Pharmacist

## 2013-03-28 ENCOUNTER — Ambulatory Visit: Payer: No Typology Code available for payment source

## 2013-03-28 DIAGNOSIS — Z86718 Personal history of other venous thrombosis and embolism: Secondary | ICD-10-CM

## 2013-03-28 DIAGNOSIS — I4891 Unspecified atrial fibrillation: Secondary | ICD-10-CM

## 2013-03-28 LAB — POCT INR: INR: 3.5

## 2013-04-15 ENCOUNTER — Ambulatory Visit: Payer: No Typology Code available for payment source | Admitting: Family

## 2013-04-15 ENCOUNTER — Ambulatory Visit (INDEPENDENT_AMBULATORY_CARE_PROVIDER_SITE_OTHER): Payer: No Typology Code available for payment source | Admitting: Family

## 2013-04-15 ENCOUNTER — Encounter: Payer: Self-pay | Admitting: Family

## 2013-04-15 VITALS — BP 126/80 | HR 79 | Temp 97.9°F | Resp 16 | Ht 64.0 in | Wt 205.0 lb

## 2013-04-15 DIAGNOSIS — F329 Major depressive disorder, single episode, unspecified: Secondary | ICD-10-CM

## 2013-04-15 DIAGNOSIS — R7309 Other abnormal glucose: Secondary | ICD-10-CM

## 2013-04-15 DIAGNOSIS — E039 Hypothyroidism, unspecified: Secondary | ICD-10-CM

## 2013-04-15 DIAGNOSIS — R739 Hyperglycemia, unspecified: Secondary | ICD-10-CM

## 2013-04-15 DIAGNOSIS — I10 Essential (primary) hypertension: Secondary | ICD-10-CM

## 2013-04-15 LAB — HEMOGLOBIN A1C: Hgb A1c MFr Bld: 6.9 % — ABNORMAL HIGH (ref <5.7)

## 2013-04-15 MED ORDER — BUPROPION HCL 75 MG PO TABS: 75.0000 mg | ORAL_TABLET | Freq: Two times a day (BID) | ORAL | Status: DC

## 2013-04-15 MED ORDER — OMEPRAZOLE 40 MG PO CPDR: 40.0000 mg | DELAYED_RELEASE_CAPSULE | Freq: Every day | ORAL | Status: DC

## 2013-04-15 NOTE — Progress Notes (Signed)
Pre visit review using our clinic review tool, if applicable. No additional management support is needed unless otherwise documented below in the visit note. 

## 2013-04-15 NOTE — Patient Instructions (Signed)
Change wellbutrin to 75mg  twice daily. Complete lab work prior to leaving. Follow up in 3 months.

## 2013-04-15 NOTE — Progress Notes (Signed)
Subjective:    Patient ID: [name], female    DOB: January 16, 1956, 57 y.o.   MRN: [id number]  HPI  [name] is a 57 yr old female who presents today for follow up.  Hypothyroid- continues synthroid and reports feeling well on this dose.  Depression- reports stress, 6 people  Living in her house.  daughter recently went back to work which has increased her household duties.  Some stress at home, but overall doing ok.   HTN-  She continues aldactone and furosemide, metoprolol.  Hyperglycemia- no longer on "sugar pill."      Review of Systems See HPI  Past Medical History  Diagnosis Date  . Depression   . DVT (deep venous thrombosis)     hx of on HRT left leg ~2006  . GERD (gastroesophageal reflux disease)   . Hyperlipidemia   . Hypertension   . Hypothyroidism   . PPD positive, treated     rx inh   . OSA on CPAP   . Diabetes mellitus   . 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 Breast cancer     a. 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 DJD (degenerative joint disease) of lumbar spine   . Chronic diastolic CHF (congestive heart failure)     a. 08/2012 Echo: EF 55-60%, no rwma, mod MR.  [name] A-fib     a. on amio for rate control/coumadin.  [name] Physical deconditioning   . Allergy   . Anemia     low iron hx  . Blood transfusion without reported diagnosis     2 u prbc  . Cataract     removed ou    History   Social History  . Marital Status: Married    Spouse Name: N/A    Number of Children: 2  . Years of Education: N/A   Occupational History  . SECRETARY [employer]   Social History Main Topics  . Smoking status: Former Smoker -- 1.00 packs/day for 1 years  . Smokeless tobacco: Never Used  . Alcohol Use: No  . Drug Use: No     Comment: quit age 75 only smoked as a teen 1 year  . Sexual Activity: No   Other Topics  Concern  . Not on file   Social History Narrative   Married   Works at [employer] secretary   Daily caffeine use - 2 cups a day plus a couple of sodas a day   Pt doesn't exercise regularly   G2P2   H H of 5 soon to be 6 .     Past Surgical History  Procedure Laterality Date  . Tubal ligation    . Cholecystectomy    . Foot surgery    . Eye surgery    . Cataract extraction    . Percutaneous liver biopsy    . Breast biopsy      left breast  . Mastectomy modified radical  06/08/2012    Procedure: MASTECTOMY MODIFIED RADICAL;  Surgeon: Mariella Saa, MD;  Location: [facility] OR;  Service: General;  Laterality: Right;  . Portacath placement  06/08/2012    Procedure: INSERTION PORT-A-CATH;  Surgeon: Mariella Saa, MD;  Location: [facility] OR;  Service: General;  Laterality: Left;  . Peg tube placement      Family History  Problem  Relation Age of Onset  . Diabetes Mother   . Hypertension Mother   . Arthritis Mother   . Heart disease Mother   . Heart failure Mother   . Other Mother     benign breast mass  . Stroke Father   . Heart disease Father   . Diabetes Paternal Grandmother   . Colon cancer Paternal Grandfather     Allergies  Allergen Reactions  . Olmesartan Medoxomil Cough    REACTION: ? if cough  . Tetracycline Hcl     Unknown reaction, too long for patient to remember   . Venlafaxine     REACTION: severe dry moouth  . Adhesive [Tape] Rash    Current Outpatient Prescriptions on File Prior to Visit  Medication Sig Dispense Refill  . clonazePAM (KLONOPIN) 0.5 MG tablet Take 0.5 tablets (0.25 mg total) by mouth 2 (two) times daily. For anxiety  60 tablet  2  . escitalopram (LEXAPRO) 10 MG tablet Take 1 tablet (10 mg total) by mouth at bedtime.  30 tablet  4  . furosemide (LASIX) 80 MG tablet Take 40 mg by mouth 2 (two) times daily.      . hyaluronate sodium (RADIAPLEXRX) GEL Apply topically 2 (two) times daily.      [name] HYDROcodone-acetaminophen (NORCO/VICODIN)  5-325 MG per tablet Take 1 tablet by mouth every 6 (six) hours as needed for pain.  20 tablet  0  . letrozole (FEMARA) 2.5 MG tablet Take 1 tablet (2.5 mg total) by mouth daily.  30 tablet  3  . levothyroxine (SYNTHROID, LEVOTHROID) 125 MCG tablet Take 1 tablet (125 mcg total) by mouth daily before breakfast.  30 tablet  3  . meclizine (ANTIVERT) 25 MG tablet Take 1 tablet (25 mg total) by mouth 3 (three) times daily as needed.  30 tablet  0  . metoprolol tartrate (LOPRESSOR) 25 MG tablet Take 1 tablet (25 mg total) by mouth 2 (two) times daily.  60 tablet  4  . OLANZapine (ZYPREXA) 2.5 MG tablet TAKE ONE TABLET BY MOUTH ONCE DAILY AT BEDTIME TO  HELP  WITH  SLEEP  OR  ANXIETY  45 tablet  3  . potassium chloride (K-DUR) 10 MEQ tablet Take 1 tablet (10 mEq total) by mouth 2 (two) times daily.  60 tablet  5  . spironolactone (ALDACTONE) 25 MG tablet Take 1 tablet (25 mg total) by mouth daily. diuretic  30 tablet  4  . warfarin (COUMADIN) 3 MG tablet Take as directed by Anticoagulation clinic  70 tablet  4   No current facility-administered medications on file prior to visit.    BP 126/80  Pulse 79  Temp(Src) 97.9 F (36.6 C) (Oral)  Resp 16  Ht 5\' 4"  (1.626 m)  Wt 205 lb (92.987 kg)  BMI 35.17 kg/m2  SpO2 97%       Objective:   Physical Exam  Constitutional: She is oriented to person, place, and time. She appears well-developed and well-nourished. No distress.  HENT:  Head: Normocephalic and atraumatic.  Cardiovascular: Normal rate and regular rhythm.   No murmur heard. Pulmonary/Chest: Effort normal and breath sounds normal. No respiratory distress. She has no wheezes. She has no rales. She exhibits no tenderness.  Musculoskeletal: She exhibits no edema.  Neurological: She is alert and oriented to person, place, and time.  Psychiatric: She has a normal mood and affect. Her behavior is normal. Judgment and thought content normal.  Assessment & Plan:

## 2013-04-15 NOTE — Assessment & Plan Note (Signed)
Obtain follow up A1C.   

## 2013-04-15 NOTE — Assessment & Plan Note (Signed)
BP stable on current meds. Continue same.  

## 2013-04-15 NOTE — Assessment & Plan Note (Signed)
Continues synthroid. Clinically stable. Continue same, obtain follow up TSH.

## 2013-04-15 NOTE — Assessment & Plan Note (Addendum)
Some stressors in the home, but reasonably stable.  Will switch wellbutrin 150mg  (12 hr formulation to regular release 75mg  bid). Continue lexapro at current dose.

## 2013-04-19 ENCOUNTER — Encounter: Payer: Self-pay | Admitting: Family

## 2013-04-22 ENCOUNTER — Telehealth: Payer: Self-pay | Admitting: Family

## 2013-04-22 NOTE — Telephone Encounter (Signed)
Spoke with pt re: directions below as current med list states 1/2 tablet twice a day. Pt states she is taking 1/2 tablet daily.  Please advise if ok to send rx as 1/2 tablet every morning?

## 2013-04-22 NOTE — Telephone Encounter (Signed)
Furosemide 80 mg take 1/2 tablet in the am  Please transfer this rx from  [facility] pharmacy to [facility] in [place]  They do not get your fax please call it in as she is out

## 2013-04-23 MED ORDER — FUROSEMIDE 80 MG PO TABS: 40.0000 mg | ORAL_TABLET | Freq: Every day | ORAL | Status: DC

## 2013-04-25 ENCOUNTER — Ambulatory Visit (INDEPENDENT_AMBULATORY_CARE_PROVIDER_SITE_OTHER): Payer: No Typology Code available for payment source | Admitting: General Practice

## 2013-04-25 DIAGNOSIS — Z86718 Personal history of other venous thrombosis and embolism: Secondary | ICD-10-CM

## 2013-04-25 DIAGNOSIS — I4891 Unspecified atrial fibrillation: Secondary | ICD-10-CM

## 2013-04-25 LAB — POCT INR: INR: 3

## 2013-05-10 ENCOUNTER — Other Ambulatory Visit: Payer: Self-pay | Admitting: Physical Medicine & Rehabilitation

## 2013-05-11 ENCOUNTER — Ambulatory Visit
Admission: RE | Admit: 2013-05-11 | Discharge: 2013-05-11 | Disposition: A | Discharge status: Home / Self Care | Payer: No Typology Code available for payment source | Source: Ambulatory Visit | Attending: Oncology | Admitting: Oncology

## 2013-05-11 ENCOUNTER — Ambulatory Visit
Admission: RE | Admit: 2013-05-11 | Discharge: 2013-05-11 | Disposition: A | Discharge status: Home / Self Care | Payer: No Typology Code available for payment source | Source: Ambulatory Visit | Attending: Physician Assistant | Admitting: Physician Assistant

## 2013-05-11 DIAGNOSIS — Z853 Personal history of malignant neoplasm of breast: Secondary | ICD-10-CM

## 2013-05-11 DIAGNOSIS — C50919 Malignant neoplasm of unspecified site of unspecified female breast: Secondary | ICD-10-CM

## 2013-05-11 DIAGNOSIS — Z9011 Acquired absence of right breast and nipple: Secondary | ICD-10-CM

## 2013-05-11 DIAGNOSIS — Z780 Asymptomatic menopausal state: Secondary | ICD-10-CM

## 2013-05-12 HISTORY — PX: OTHER SURGICAL HISTORY: SHX169

## 2013-05-19 ENCOUNTER — Telehealth: Payer: Self-pay | Admitting: *Deleted

## 2013-05-19 NOTE — Telephone Encounter (Signed)
Call received from [facility] in [place] stating pt is requesting a letter from [place] stating she is disabled and unable to work for application for exemption per due bills.  This RN reviewed chart and noted pt is not receiving active chemotherapy and per last office visit here there was no evidence of cancer.  Per diagnosis followed by Dr Jannifer Rodney- not noted as causing pt to be disabled.  This RN called to [employer] at [phone] and left message per above.

## 2013-05-20 ENCOUNTER — Telehealth: Payer: Self-pay | Admitting: Family

## 2013-05-20 ENCOUNTER — Ambulatory Visit (INDEPENDENT_AMBULATORY_CARE_PROVIDER_SITE_OTHER): Payer: No Typology Code available for payment source | Admitting: Pharmacist

## 2013-05-20 ENCOUNTER — Encounter: Payer: Self-pay | Admitting: Family

## 2013-05-20 DIAGNOSIS — I4891 Unspecified atrial fibrillation: Secondary | ICD-10-CM

## 2013-05-20 DIAGNOSIS — Z86718 Personal history of other venous thrombosis and embolism: Secondary | ICD-10-CM

## 2013-05-20 LAB — POCT INR: INR: 4.2

## 2013-05-20 NOTE — Telephone Encounter (Signed)
Needs letter stating why she is on permanent disability, stating her health problems.

## 2013-05-20 NOTE — Telephone Encounter (Signed)
Spoke with pt. She states she has been placed on permanent disability and needs letter from [name] stating this and to be addressed to: Teacher, early years/pre. AttnAllen Norris. Fax# [phone]. Please let pt know when letter has been completed and faxed.

## 2013-05-20 NOTE — Telephone Encounter (Signed)
See letter.

## 2013-05-20 NOTE — Telephone Encounter (Signed)
Letter faxed, notified pt's husband.

## 2013-05-27 ENCOUNTER — Ambulatory Visit (INDEPENDENT_AMBULATORY_CARE_PROVIDER_SITE_OTHER): Payer: No Typology Code available for payment source | Admitting: *Deleted

## 2013-05-27 DIAGNOSIS — Z86718 Personal history of other venous thrombosis and embolism: Secondary | ICD-10-CM

## 2013-05-27 DIAGNOSIS — I4891 Unspecified atrial fibrillation: Secondary | ICD-10-CM

## 2013-05-27 LAB — POCT INR: INR: 3.5

## 2013-06-08 ENCOUNTER — Telehealth: Payer: Self-pay | Admitting: Family

## 2013-06-08 MED ORDER — OSELTAMIVIR PHOSPHATE 75 MG PO CAPS: 75.0000 mg | ORAL_CAPSULE | Freq: Every day | ORAL | Status: DC

## 2013-06-08 NOTE — Telephone Encounter (Signed)
Request tamiflu rx, grandchildren that live with her have tested positive for the flu

## 2013-06-08 NOTE — Telephone Encounter (Signed)
Rx sent 

## 2013-06-08 NOTE — Telephone Encounter (Signed)
Notified pt. 

## 2013-06-10 ENCOUNTER — Ambulatory Visit (INDEPENDENT_AMBULATORY_CARE_PROVIDER_SITE_OTHER): Payer: No Typology Code available for payment source | Admitting: *Deleted

## 2013-06-10 DIAGNOSIS — I4891 Unspecified atrial fibrillation: Secondary | ICD-10-CM

## 2013-06-10 DIAGNOSIS — Z5181 Encounter for therapeutic drug level monitoring: Secondary | ICD-10-CM

## 2013-06-10 DIAGNOSIS — Z86718 Personal history of other venous thrombosis and embolism: Secondary | ICD-10-CM

## 2013-06-10 LAB — POCT INR: INR: 2.7

## 2013-06-21 ENCOUNTER — Other Ambulatory Visit: Payer: Self-pay | Admitting: Physical Medicine & Rehabilitation

## 2013-06-24 ENCOUNTER — Ambulatory Visit (INDEPENDENT_AMBULATORY_CARE_PROVIDER_SITE_OTHER): Payer: No Typology Code available for payment source | Admitting: *Deleted

## 2013-06-24 DIAGNOSIS — Z86718 Personal history of other venous thrombosis and embolism: Secondary | ICD-10-CM

## 2013-06-24 DIAGNOSIS — I4891 Unspecified atrial fibrillation: Secondary | ICD-10-CM

## 2013-06-24 DIAGNOSIS — Z5181 Encounter for therapeutic drug level monitoring: Secondary | ICD-10-CM

## 2013-06-24 LAB — POCT INR: INR: 3.3

## 2013-06-29 ENCOUNTER — Telehealth: Payer: Self-pay

## 2013-06-29 NOTE — Telephone Encounter (Signed)
Refill request from [facility] for Klonopin 0.5mg  1 tablet po bid #60.  Patient has not been seen in a while.  Please advise on refill request.

## 2013-06-29 NOTE — Telephone Encounter (Signed)
May RF x 2. thanks

## 2013-06-30 ENCOUNTER — Telehealth: Payer: Self-pay | Admitting: *Deleted

## 2013-06-30 MED ORDER — CLONAZEPAM 0.5 MG PO TABS: 0.2500 mg | ORAL_TABLET | Freq: Two times a day (BID) | ORAL | Status: DC

## 2013-06-30 NOTE — Telephone Encounter (Signed)
[facility] has called about the clonopin order that was refilled yesterday.  We have not seen this patient but one time in the office and that was back in June of 2014.  You have 2 patients with the same name and I question if you were thinking of the other [name].  The order reads klonopin 0.5 mg (.25mg  total) 2 times per day # 60 with 2 RF.  The disp # should be #30  if .25mg  bid.  Lookin back this seems to have been written this way before, but as I said we have only seen the one time outside of hospital.  Do you want to see the patient before we prescribe this medication? Your note does not mention it.

## 2013-06-30 NOTE — Telephone Encounter (Signed)
Clonazepam called into [facility].

## 2013-07-01 NOTE — Telephone Encounter (Signed)
Yes, I would need to see her first. You are correct,  i thought it was the other [facility].    thanks

## 2013-07-04 NOTE — Telephone Encounter (Signed)
Notified [name] that she would need an appointment and that she might consider having her PCP fill since that is the only med she needs. The one med.  She said that her NP will fill and that she has told [facility] this so if it is sent to [name] again we should ignore it.

## 2013-07-08 ENCOUNTER — Ambulatory Visit (INDEPENDENT_AMBULATORY_CARE_PROVIDER_SITE_OTHER): Payer: No Typology Code available for payment source | Admitting: *Deleted

## 2013-07-08 DIAGNOSIS — I4891 Unspecified atrial fibrillation: Secondary | ICD-10-CM

## 2013-07-08 DIAGNOSIS — Z5181 Encounter for therapeutic drug level monitoring: Secondary | ICD-10-CM

## 2013-07-08 DIAGNOSIS — Z86718 Personal history of other venous thrombosis and embolism: Secondary | ICD-10-CM

## 2013-07-08 LAB — POCT INR: INR: 2.4

## 2013-07-11 ENCOUNTER — Ambulatory Visit: Payer: Self-pay | Admitting: Family

## 2013-07-20 NOTE — Telephone Encounter (Signed)
Klonopin needs to be called in.  Also, from a bigger picture standpoint, these pharmacies need to stop requesting refills on benzos via automatic refill

## 2013-07-21 ENCOUNTER — Telehealth: Payer: Self-pay | Admitting: Family

## 2013-07-21 MED ORDER — LEVOTHYROXINE SODIUM 125 MCG PO TABS: 125.0000 ug | ORAL_TABLET | Freq: Every day | ORAL | Status: DC

## 2013-07-21 NOTE — Telephone Encounter (Signed)
Requesting refill on Levothyroxine, please all into [facility] in [place]

## 2013-07-25 ENCOUNTER — Other Ambulatory Visit: Payer: Self-pay | Admitting: Family

## 2013-07-25 ENCOUNTER — Ambulatory Visit (INDEPENDENT_AMBULATORY_CARE_PROVIDER_SITE_OTHER): Payer: No Typology Code available for payment source | Admitting: Family

## 2013-07-25 ENCOUNTER — Telehealth: Payer: Self-pay | Admitting: *Deleted

## 2013-07-25 ENCOUNTER — Encounter: Payer: Self-pay | Admitting: Family

## 2013-07-25 VITALS — BP 114/70 | HR 75 | Temp 97.7°F | Resp 16 | Ht 64.0 in | Wt 211.0 lb

## 2013-07-25 DIAGNOSIS — F329 Major depressive disorder, single episode, unspecified: Secondary | ICD-10-CM

## 2013-07-25 DIAGNOSIS — Z0000 Encounter for general adult medical examination without abnormal findings: Secondary | ICD-10-CM

## 2013-07-25 DIAGNOSIS — E039 Hypothyroidism, unspecified: Secondary | ICD-10-CM

## 2013-07-25 DIAGNOSIS — E119 Type 2 diabetes mellitus without complications: Secondary | ICD-10-CM

## 2013-07-25 DIAGNOSIS — I5032 Chronic diastolic (congestive) heart failure: Secondary | ICD-10-CM

## 2013-07-25 DIAGNOSIS — F3289 Other specified depressive episodes: Secondary | ICD-10-CM

## 2013-07-25 DIAGNOSIS — I10 Essential (primary) hypertension: Secondary | ICD-10-CM

## 2013-07-25 LAB — HEMOGLOBIN A1C
Hgb A1c MFr Bld: 9.8 % — ABNORMAL HIGH (ref <5.7)
Mean Plasma Glucose: 235 mg/dL — ABNORMAL HIGH (ref <117)

## 2013-07-25 LAB — LIPID PANEL
Cholesterol: 196 mg/dL (ref 0–200)
HDL: 34 mg/dL — ABNORMAL LOW (ref >39)
LDL Cholesterol: 136 mg/dL — ABNORMAL HIGH (ref 0–99)
TRIGLYCERIDES: 131 mg/dL (ref <150)
Total CHOL/HDL Ratio: 5.8 Ratio
VLDL: 26 mg/dL (ref 0–40)

## 2013-07-25 LAB — HEPATIC FUNCTION PANEL
ALT: 40 U/L — AB (ref 0–35)
AST: 58 U/L — ABNORMAL HIGH (ref 0–37)
Albumin: 3.6 g/dL (ref 3.5–5.2)
Alkaline Phosphatase: 134 U/L — ABNORMAL HIGH (ref 39–117)
BILIRUBIN INDIRECT: 0.6 mg/dL (ref 0.2–1.2)
Bilirubin, Direct: 0.2 mg/dL (ref 0.0–0.3)
Total Bilirubin: 0.8 mg/dL (ref 0.2–1.2)
Total Protein: 6.7 g/dL (ref 6.0–8.3)

## 2013-07-25 LAB — BASIC METABOLIC PANEL WITH GFR
BUN: 5 mg/dL — ABNORMAL LOW (ref 6–23)
CO2: 25 mEq/L (ref 19–32)
Calcium: 8.9 mg/dL (ref 8.4–10.5)
Chloride: 105 mEq/L (ref 96–112)
Creat: 0.69 mg/dL (ref 0.50–1.10)
GFR, Est African American: >89 mL/min
GLUCOSE: 216 mg/dL — AB (ref 70–99)
Potassium: 4.1 mEq/L (ref 3.5–5.3)
SODIUM: 137 meq/L (ref 135–145)

## 2013-07-25 LAB — TSH: TSH: 5.497 u[IU]/mL — AB (ref 0.350–4.500)

## 2013-07-25 MED ORDER — CLONAZEPAM 0.5 MG PO TABS: 0.2500 mg | ORAL_TABLET | Freq: Two times a day (BID) | ORAL | Status: DC

## 2013-07-25 MED ORDER — FUROSEMIDE 20 MG PO TABS: 20.0000 mg | ORAL_TABLET | Freq: Every day | ORAL | Status: DC

## 2013-07-25 NOTE — Assessment & Plan Note (Signed)
Will cut back lasix from 40mg  to 20mg  once daily. Pt is advised to let us know if she develops shortness of breath or recurrent swelling.

## 2013-07-25 NOTE — Progress Notes (Signed)
Pre visit review using our clinic review tool, if applicable. No additional management support is needed unless otherwise documented below in the visit note. 

## 2013-07-25 NOTE — Patient Instructions (Addendum)
Please complete lab work prior to leaving. Call if you develop shortness of breath or swelling. Follow up in 3 months, sooner if problems/concerns.

## 2013-07-25 NOTE — Assessment & Plan Note (Signed)
Will obtain A1C and urine microalbumin. Pt will schedule eye exam.

## 2013-07-25 NOTE — Telephone Encounter (Signed)
Message copied by Ronny Flurry on Mon Jul 25, 2013  4:12 PM ------      Message from: O'SULLIVAN, MELISSA      Created: Mon Jul 25, 2013  8:55 AM       I meant to check FLP today on her.  Can we please check to see if they can add it on?  Dx hyperlipidemia      thanks ------

## 2013-07-25 NOTE — Progress Notes (Signed)
Subjective:    Patient ID: [name], female    DOB: [birth year], 58 y.o.   MRN: [id number]  HPI  [name] is a 58 yr old female who presents today for follow.  DM2- Due for eye exam. Pt tells me that she will schedule eye exam. Not checking sugars.  Depression- on clonazepam, wellbutrin, zyprexa and  Lexapro. Feels tearful at times, but overall feels that depression is adequately controlled. Denies SI/HI.  No longer seeing Psychiatry due to cost.   HTN- reports that she is no longer having issues with edema.  Wants to know if she can come off of some of the diuretics.     Review of Systems See HPI  Past Medical History  Diagnosis Date  . Depression   . DVT (deep venous thrombosis)     hx of on HRT left leg ~2006  . GERD (gastroesophageal reflux disease)   . Hyperlipidemia   . Hypertension   . Hypothyroidism   . PPD positive, treated     rx inh   . OSA on CPAP   . Diabetes mellitus   . 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 Breast cancer     a. 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 DJD (degenerative joint disease) of lumbar spine   . Chronic diastolic CHF (congestive heart failure)     a. 08/2012 Echo: EF 55-60%, no rwma, mod MR.  [name] A-fib     a. on amio for rate control/coumadin.  [name] Physical deconditioning   . Allergy   . Anemia     low iron hx  . Blood transfusion without reported diagnosis     2 u prbc  . Cataract     removed ou    History   Social History  . Marital Status: Married    Spouse Name: N/A    Number of Children: 2  . Years of Education: N/A   Occupational History  . [name] History Main Topics  . Smoking status: Former Smoker -- 1.00 packs/day for 1 years  . Smokeless tobacco: Never Used  . Alcohol Use: No  . Drug Use: No     Comment: quit age 14 only smoked as a teen 1 year    . Sexual Activity: No   Other Topics Concern  . 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     Past Surgical History  Procedure Laterality Date  . Tubal ligation    . Cholecystectomy    . Foot surgery    . Eye surgery    . Cataract extraction    . Percutaneous liver biopsy    . Breast biopsy      left breast  . Mastectomy modified radical  06/08/2012    Procedure: MASTECTOMY MODIFIED RADICAL;  Surgeon: Edward Jolly, MD;  Location: [facility];  Service: General;  Laterality: Right;  . Portacath placement  06/08/2012    Procedure: INSERTION PORT-A-CATH;  Surgeon: Edward Jolly, MD;  Location: [facility];  Service: General;  Laterality: Left;  .  Peg tube placement      Family History  Problem Relation Age of Onset  . Diabetes Mother   . Hypertension Mother   . Arthritis Mother   . Heart disease Mother   . Heart failure Mother   . Other Mother     benign breast mass  . Stroke Father   . Heart disease Father   . Diabetes Paternal Grandmother   . Colon cancer Paternal Grandfather     Allergies  Allergen Reactions  . Olmesartan Medoxomil Cough    REACTION: ? if cough  . Tetracycline Hcl     Unknown reaction, too long for patient to remember   . Venlafaxine     REACTION: severe dry moouth  . Adhesive [Tape] Rash    Current Outpatient Prescriptions on File Prior to Visit  Medication Sig Dispense Refill  . buPROPion (WELLBUTRIN) 75 MG tablet Take 1 tablet (75 mg total) by mouth 2 (two) times daily.  60 tablet  3  . clonazePAM (KLONOPIN) 0.5 MG tablet Take 0.5 tablets (0.25 mg total) by mouth 2 (two) times daily. For anxiety  60 tablet  2  . escitalopram (LEXAPRO) 10 MG tablet Take 1 tablet (10 mg total) by mouth at bedtime.  30 tablet  4  . furosemide (LASIX) 80 MG tablet Take 0.5 tablets  (40 mg total) by mouth daily.  30 tablet  2  . hyaluronate sodium (RADIAPLEXRX) GEL Apply topically 2 (two) times daily.      [name] HYDROcodone-acetaminophen (NORCO/VICODIN) 5-325 MG per tablet Take 1 tablet by mouth every 6 (six) hours as needed for pain.  20 tablet  0  . letrozole (FEMARA) 2.5 MG tablet Take 1 tablet (2.5 mg total) by mouth daily.  30 tablet  3  . levothyroxine (SYNTHROID, LEVOTHROID) 125 MCG tablet Take 1 tablet (125 mcg total) by mouth daily before breakfast.  30 tablet  3  . meclizine (ANTIVERT) 25 MG tablet Take 1 tablet (25 mg total) by mouth 3 (three) times daily as needed.  30 tablet  0  . metoprolol tartrate (LOPRESSOR) 25 MG tablet Take 1 tablet (25 mg total) by mouth 2 (two) times daily.  60 tablet  4  . OLANZapine (ZYPREXA) 2.5 MG tablet TAKE ONE TABLET BY MOUTH ONCE DAILY AT BEDTIME TO  HELP  WITH  SLEEP  OR  ANXIETY  45 tablet  3  . omeprazole (PRILOSEC) 40 MG capsule Take 1 capsule (40 mg total) by mouth daily.  30 capsule  5  . oseltamivir (TAMIFLU) 75 MG capsule Take 1 capsule (75 mg total) by mouth daily.  7 capsule  0  . potassium chloride (K-DUR) 10 MEQ tablet Take 1 tablet (10 mEq total) by mouth 2 (two) times daily.  60 tablet  5  . spironolactone (ALDACTONE) 25 MG tablet Take 1 tablet (25 mg total) by mouth daily. diuretic  30 tablet  4  . warfarin (COUMADIN) 3 MG tablet Take as directed by Anticoagulation clinic  70 tablet  4   No current facility-administered medications on file prior to visit.    BP 114/70  Pulse 75  Temp(Src) 97.7 F (36.5 C) (Oral)  Resp 16  Ht 5\' 4"  (1.626 m)  Wt 211 lb (95.709 kg)  BMI 36.20 kg/m2  SpO2 97%       Objective:   Physical Exam  Constitutional: She is oriented to person, place, and time. She appears well-developed and well-nourished. No distress.  HENT:  Head: Normocephalic and atraumatic.  Cardiovascular: Normal rate and regular rhythm.   No murmur heard. Pulmonary/Chest: Effort normal and breath sounds  normal. No respiratory distress. She has no wheezes. She has no rales. She exhibits no tenderness.  Musculoskeletal: She exhibits no edema.  Neurological: She is alert and oriented to person, place, and time.  Psychiatric: She has a normal mood and affect. Her behavior is normal. Judgment and thought content normal.          Assessment & Plan:

## 2013-07-25 NOTE — Assessment & Plan Note (Signed)
BP is stable on current meds.  Continue same.  

## 2013-07-25 NOTE — Assessment & Plan Note (Signed)
We did discuss possibility of increasing lexapro dose but pt declines.  She is content with current meds/control of depression.

## 2013-07-25 NOTE — Telephone Encounter (Signed)
Test has been added per [facility].

## 2013-07-26 ENCOUNTER — Telehealth: Payer: Self-pay | Admitting: Family

## 2013-07-26 DIAGNOSIS — E785 Hyperlipidemia, unspecified: Secondary | ICD-10-CM

## 2013-07-26 LAB — MICROALBUMIN / CREATININE URINE RATIO
Creatinine, Urine: 69.1 mg/dL
MICROALB/CREAT RATIO: 22.6 mg/g (ref 0.0–30.0)
Microalb, Ur: 1.56 mg/dL (ref 0.00–1.89)

## 2013-07-26 MED ORDER — ATORVASTATIN CALCIUM 10 MG PO TABS: 10.0000 mg | ORAL_TABLET | Freq: Every day | ORAL | Status: DC

## 2013-07-26 MED ORDER — METFORMIN HCL 1000 MG PO TABS: 1000.0000 mg | ORAL_TABLET | Freq: Two times a day (BID) | ORAL | Status: DC

## 2013-07-26 MED ORDER — LEVOTHYROXINE SODIUM 150 MCG PO TABS: 150.0000 ug | ORAL_TABLET | Freq: Every day | ORAL | Status: DC

## 2013-07-26 NOTE — Telephone Encounter (Signed)
Left message on home # to return my call. 

## 2013-07-26 NOTE — Telephone Encounter (Signed)
Relevant patient education mailed to patient.  

## 2013-07-26 NOTE — Telephone Encounter (Addendum)
Sugar control much worse than last visit.  A1C 9.8 up from 6.9.  Goal is <7.  Start metformin twice daily, avoid concentrated sweets, limit carbs. Increase levothyroxine from 172mcg to 177mcg.  Repeat TSH in 6 weeks, dx hypothyroid. Cholesterol is above goal resume atorvastatin 10mg  once daily. Repeat flp/lft in 6 weeks, dx hyperlipidemia.

## 2013-07-28 ENCOUNTER — Telehealth: Payer: Self-pay

## 2013-07-28 NOTE — Telephone Encounter (Signed)
Relevant patient education mailed to patient.  

## 2013-07-29 ENCOUNTER — Ambulatory Visit (INDEPENDENT_AMBULATORY_CARE_PROVIDER_SITE_OTHER): Payer: No Typology Code available for payment source | Admitting: *Deleted

## 2013-07-29 DIAGNOSIS — Z86718 Personal history of other venous thrombosis and embolism: Secondary | ICD-10-CM

## 2013-07-29 DIAGNOSIS — Z5181 Encounter for therapeutic drug level monitoring: Secondary | ICD-10-CM

## 2013-07-29 DIAGNOSIS — I4891 Unspecified atrial fibrillation: Secondary | ICD-10-CM

## 2013-07-29 LAB — POCT INR: INR: 2.8

## 2013-07-29 NOTE — Telephone Encounter (Signed)
Notified pt and she voices understanding.  States she took metformin in the past and does not think it was effective. States was taking onglyza once a day and it was controlling blood sugar well.  Also wants to make sure we know about her fatty liver (elevated LFTs) before she starts atorvastatin.  Please advise.

## 2013-07-31 MED ORDER — SAXAGLIPTIN HCL 5 MG PO TABS: 5.0000 mg | ORAL_TABLET | Freq: Every day | ORAL | Status: DC

## 2013-07-31 NOTE — Telephone Encounter (Signed)
Lets not have her start metformin, and instead have her start onglyza. Lets not give her atorvastatin either.  I would like her to work really hard on low fat diet, exercise.   Could you please cancel rx for metformin and atorvastatin at her pharmacy?

## 2013-08-01 NOTE — Telephone Encounter (Signed)
Notified pt and she voices understanding. 

## 2013-08-01 NOTE — Telephone Encounter (Signed)
Cancelled Rxs at [facility].  Left message on pt's cell# to have pt return my call.

## 2013-08-02 ENCOUNTER — Other Ambulatory Visit: Payer: Self-pay | Admitting: *Deleted

## 2013-08-02 MED ORDER — LETROZOLE 2.5 MG PO TABS: 2.5000 mg | ORAL_TABLET | Freq: Every day | ORAL | Status: DC

## 2013-08-04 ENCOUNTER — Other Ambulatory Visit: Payer: Self-pay | Admitting: Family

## 2013-08-04 ENCOUNTER — Telehealth: Payer: Self-pay | Admitting: Pharmacist

## 2013-08-04 MED ORDER — WARFARIN SODIUM 3 MG PO TABS: ORAL_TABLET | ORAL | Status: DC

## 2013-08-04 NOTE — Telephone Encounter (Signed)
New message     Refill warfarin----[facility]  [phone]

## 2013-08-27 ENCOUNTER — Other Ambulatory Visit: Payer: Self-pay | Admitting: Family

## 2013-08-29 ENCOUNTER — Ambulatory Visit (INDEPENDENT_AMBULATORY_CARE_PROVIDER_SITE_OTHER): Payer: No Typology Code available for payment source | Admitting: *Deleted

## 2013-08-29 DIAGNOSIS — I4891 Unspecified atrial fibrillation: Secondary | ICD-10-CM

## 2013-08-29 DIAGNOSIS — Z5181 Encounter for therapeutic drug level monitoring: Secondary | ICD-10-CM

## 2013-08-29 DIAGNOSIS — Z86718 Personal history of other venous thrombosis and embolism: Secondary | ICD-10-CM

## 2013-08-29 LAB — POCT INR: INR: 1.7

## 2013-08-29 NOTE — Telephone Encounter (Signed)
Please sent with directions as below.

## 2013-08-29 NOTE — Telephone Encounter (Signed)
Pt has follow up in June.  Please advise.  Medication name:  Name from pharmacy:  clonazePAM (KLONOPIN) 0.5 MG tablet  CLONAZEPAM 0.5 MG TABLET Sig: TAKE 1/2 TABLET BY MOUTH TWICE DAILY AS NEEDED FOR ANXIETY Dispense: 60 tablet (Pharmacy requested 62 each) Start: 08/27/2013 Class: Normal Requested on: 07/25/2013 Originally ordered on: 09/15/2012 Last refill: 08/02/2013

## 2013-08-30 ENCOUNTER — Telehealth: Payer: Self-pay | Admitting: *Deleted

## 2013-08-30 MED ORDER — CLONAZEPAM 0.5 MG PO TABS
ORAL_TABLET | ORAL | Status: DC
Start: ? — End: 2013-11-03

## 2013-08-30 NOTE — Telephone Encounter (Signed)
Received fax from [place] for prior auth on pt's Onglyza.  Attempted to reach pt to find out other alternatives pt may have tried / failed. I see amaryl from 07/2011; why was it stopped? Metformin was documented in previous phone note that pt had tried it before and it was not effective.  How long did pt take it previously?

## 2013-08-30 NOTE — Telephone Encounter (Signed)
Rx printed and faxed to pharmacy as below.

## 2013-08-31 ENCOUNTER — Ambulatory Visit (INDEPENDENT_AMBULATORY_CARE_PROVIDER_SITE_OTHER): Payer: No Typology Code available for payment source | Admitting: Family

## 2013-08-31 ENCOUNTER — Telehealth: Payer: Self-pay | Admitting: Family

## 2013-08-31 ENCOUNTER — Encounter: Payer: Self-pay | Admitting: Family

## 2013-08-31 VITALS — BP 120/70 | HR 75 | Temp 97.9°F | Resp 18 | Ht 64.0 in | Wt 213.1 lb

## 2013-08-31 DIAGNOSIS — H6690 Otitis media, unspecified, unspecified ear: Secondary | ICD-10-CM | POA: Insufficient documentation

## 2013-08-31 MED ORDER — BENZONATATE 100 MG PO CAPS: 100.0000 mg | ORAL_CAPSULE | Freq: Three times a day (TID) | ORAL | Status: DC | PRN

## 2013-08-31 MED ORDER — AMOXICILLIN 500 MG PO CAPS: 500.0000 mg | ORAL_CAPSULE | Freq: Three times a day (TID) | ORAL | Status: DC

## 2013-08-31 NOTE — Progress Notes (Signed)
Pre visit review using our clinic review tool, if applicable. No additional management support is needed unless otherwise documented below in the visit note. 

## 2013-08-31 NOTE — Telephone Encounter (Signed)
Left message on home # for pt to return my call. 

## 2013-08-31 NOTE — Telephone Encounter (Signed)
Spoke with pt. She thinks amaryl was stopped due to elevation of liver enzymes. PA form updated and faxed to [facility] [facility] at [phone].  Awaiting determination.

## 2013-08-31 NOTE — Progress Notes (Signed)
Subjective:    Patient ID: [name], female    DOB: July 13, 1955, 58 y.o.   MRN: [id number]  HPI  [name] is a 58 yr old female who presents today with chief complaint of Right facial pain/nasal drainage and cough. Reports that symptoms started on Monday. She reports temp 100.4 yesterday.  She has tried nasal saline and delsym without improvement in her symptoms.    Review of Systems See HPI  Past Medical History  Diagnosis Date  . Depression   . DVT (deep venous thrombosis)     hx of on HRT left leg ~2006  . GERD (gastroesophageal reflux disease)   . Hyperlipidemia   . Hypertension   . Hypothyroidism   . PPD positive, treated     rx inh   . OSA on CPAP   . Diabetes mellitus   . 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 Breast cancer     a. 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 DJD (degenerative joint disease) of lumbar spine   . Chronic diastolic CHF (congestive heart failure)     a. 08/2012 Echo: EF 55-60%, no rwma, mod MR.  [name] A-fib     a. on amio for rate control/coumadin.  [name] Physical deconditioning   . Allergy   . Anemia     low iron hx  . Blood transfusion without reported diagnosis     2 u prbc  . Cataract     removed ou    History   Social History  . Marital Status: Married    Spouse Name: N/A    Number of Children: 2  . Years of Education: N/A   Occupational History  . [place] History Main Topics  . Smoking status: Former Smoker -- 1.00 packs/day for 1 years  . Smokeless tobacco: Never Used  . Alcohol Use: No  . Drug Use: No     Comment: quit age 28 only smoked as a teen 1 year  . Sexual Activity: No   Other Topics Concern  . 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     Past Surgical History  Procedure Laterality Date  . Tubal ligation    . Cholecystectomy    . Foot surgery    . Eye surgery    . Cataract extraction    . Percutaneous liver biopsy    . Breast biopsy      left breast  . Mastectomy modified radical  06/08/2012    Procedure: MASTECTOMY MODIFIED RADICAL;  Surgeon: Edward Jolly, MD;  Location: [facility];  Service: General;  Laterality: Right;  . Portacath placement  06/08/2012    Procedure: INSERTION PORT-A-CATH;  Surgeon: Edward Jolly, MD;  [facility];  Service: General;  Laterality: Left;  . Peg tube placement      Family History  Problem Relation Age of Onset  . Diabetes Mother   . Hypertension Mother   . Arthritis Mother   . Heart disease Mother   . Heart failure Mother   .  Other Mother     benign breast mass  . Stroke Father   . Heart disease Father   . Diabetes Paternal Grandmother   . Colon cancer Paternal Grandfather     Allergies  Allergen Reactions  . Olmesartan Medoxomil Cough    REACTION: ? if cough  . Tetracycline Hcl     Unknown reaction, too long for patient to remember   . Venlafaxine     REACTION: severe dry moouth  . Adhesive [Tape] Rash    Current Outpatient Prescriptions on File Prior to Visit  Medication Sig Dispense Refill  . atorvastatin (LIPITOR) 10 MG tablet Take 1 tablet (10 mg total) by mouth daily.  30 tablet  3  . buPROPion (WELLBUTRIN) 75 MG tablet TAKE 1 TABLET TWICE DAILY  60 tablet  3  . clonazePAM (KLONOPIN) 0.5 MG tablet 1/2 tab by mouth twice daily  60 tablet  0  . escitalopram (LEXAPRO) 10 MG tablet TAKE 1 TABLET NIGHTLY AT BEDTIME  30 tablet  3  . furosemide (LASIX) 20 MG tablet Take 1 tablet (20 mg total) by mouth daily.  30 tablet  3  . hyaluronate sodium (RADIAPLEXRX) GEL Apply topically 2 (two) times daily.      [name] HYDROcodone-acetaminophen (NORCO/VICODIN) 5-325 MG per tablet Take 1 tablet by mouth  every 6 (six) hours as needed for pain.  20 tablet  0  . letrozole (FEMARA) 2.5 MG tablet Take 1 tablet (2.5 mg total) by mouth daily.  30 tablet  12  . levothyroxine (SYNTHROID, LEVOTHROID) 150 MCG tablet Take 1 tablet (150 mcg total) by mouth daily.  30 tablet  2  . meclizine (ANTIVERT) 25 MG tablet Take 1 tablet (25 mg total) by mouth 3 (three) times daily as needed.  30 tablet  0  . metFORMIN (GLUCOPHAGE) 1000 MG tablet Take 1 tablet (1,000 mg total) by mouth 2 (two) times daily with a meal.  60 tablet  3  . metoprolol tartrate (LOPRESSOR) 25 MG tablet TAKE 1 TABLET BY MOUTH TWICE DAILY  60 tablet  2  . OLANZapine (ZYPREXA) 2.5 MG tablet TAKE ONE TABLET BY MOUTH ONCE DAILY AT BEDTIME TO  HELP  WITH  SLEEP  OR  ANXIETY  45 tablet  3  . omeprazole (PRILOSEC) 40 MG capsule Take 1 capsule (40 mg total) by mouth daily.  30 capsule  5  . potassium chloride (K-DUR) 10 MEQ tablet Take 1 tablet (10 mEq total) by mouth 2 (two) times daily.  60 tablet  5  . saxagliptin HCl (ONGLYZA) 5 MG TABS tablet Take 1 tablet (5 mg total) by mouth daily.  30 tablet  3  . spironolactone (ALDACTONE) 25 MG tablet TAKE 1 TABLET BY MOUTH DAILY (DIURETIC)  30 tablet  2  . warfarin (COUMADIN) 3 MG tablet Take as directed by Anticoagulation clinic  70 tablet  3   No current facility-administered medications on file prior to visit.    BP 120/70  Pulse 75  Temp(Src) 97.9 F (36.6 C) (Oral)  Resp 18  Ht 5\' 4"  (1.626 m)  Wt 213 lb 1.9 oz (96.671 kg)  BMI 36.56 kg/m2  SpO2 97%       Objective:   Physical Exam  Constitutional: She is oriented to person, place, and time. She appears well-developed and well-nourished. No distress.  HENT:  Head: Normocephalic and atraumatic.  Right Ear: Tympanic membrane is erythematous. Tympanic membrane is not bulging.  Left Ear: Tympanic membrane is erythematous.  TM erythema  R>L + maxillary and sinus tenderness to palpation  Cardiovascular: Normal rate and regular rhythm.   No  murmur heard. Pulmonary/Chest: Effort normal and breath sounds normal. No respiratory distress. She has no wheezes. She has no rales. She exhibits no tenderness.  Few coarse upper airway rhonchi noted  Neurological: She is alert and oriented to person, place, and time.  Psychiatric: She has a normal mood and affect. Her behavior is normal. Judgment and thought content normal.          Assessment & Plan:

## 2013-08-31 NOTE — Telephone Encounter (Signed)
I am starting [name] on amoxicillin. Could you please recheck PT/INR in 1 week?

## 2013-08-31 NOTE — Patient Instructions (Addendum)
Start amoxicillin for ear infection and sinus. You may use tessalon as needed for cough. Add claritin 10mg  once daily and continue nasal saline or congestion. Call if symptoms worsen, if you develop fever >101 or if not improved in 2-3 days.

## 2013-08-31 NOTE — Assessment & Plan Note (Signed)
Will rx with amoxicillin. Suspect early associated sinusitis. Cough exacerbated by sinus drainage.  Rx with tessalon as needed for cough. Add claritin 10mg  once daily and continue nasal saline or congestion. Call if symptoms worsen, if you develop fever >101 or if not improved in 2-3 days.

## 2013-08-31 NOTE — Telephone Encounter (Signed)
Called and spoke with Debbrah Alar NP and informed that no interaction between Ampicillin and Coumadin and she cancelled order for PT/INR in 1 week Called and spoke with Mrs Deupree and informed her as well  No interaction between Ampicillin and Coumadin and she states understanding.Instructed to keep her appt in clinic as scheduled.

## 2013-09-02 NOTE — Telephone Encounter (Signed)
Received approval from [facility] for [place] from 09/01/13 through 09/02/14. Notified pt's husband and he will contact the pharmacy.

## 2013-09-06 ENCOUNTER — Telehealth: Payer: Self-pay | Admitting: Oncology

## 2013-09-06 ENCOUNTER — Encounter: Payer: Self-pay | Admitting: Oncology

## 2013-09-06 NOTE — Telephone Encounter (Signed)
KK out and GM overseeing [facility] pts. pt 4/30 appt moved out to 8/18. per GM as far as oct ok. s/w pt she is aware and has new appt for lb/fu 8/18.

## 2013-09-08 ENCOUNTER — Other Ambulatory Visit: Payer: Self-pay

## 2013-09-08 ENCOUNTER — Ambulatory Visit: Payer: Self-pay | Admitting: Oncology

## 2013-09-12 ENCOUNTER — Ambulatory Visit (INDEPENDENT_AMBULATORY_CARE_PROVIDER_SITE_OTHER): Payer: No Typology Code available for payment source | Admitting: *Deleted

## 2013-09-12 DIAGNOSIS — Z5181 Encounter for therapeutic drug level monitoring: Secondary | ICD-10-CM

## 2013-09-12 DIAGNOSIS — I4891 Unspecified atrial fibrillation: Secondary | ICD-10-CM

## 2013-09-12 DIAGNOSIS — Z86718 Personal history of other venous thrombosis and embolism: Secondary | ICD-10-CM

## 2013-09-12 LAB — POCT INR: INR: 4.7

## 2013-09-20 ENCOUNTER — Telehealth: Payer: Self-pay | Admitting: *Deleted

## 2013-09-20 NOTE — Telephone Encounter (Signed)
This RN returned call per message from patient stating " please return my call " with given number of [phone].  Call returned to above-obtained identified VM.  Message left for pt to return call to MD's nurse.

## 2013-09-26 ENCOUNTER — Ambulatory Visit (INDEPENDENT_AMBULATORY_CARE_PROVIDER_SITE_OTHER): Payer: No Typology Code available for payment source | Admitting: Surgery

## 2013-09-26 DIAGNOSIS — I4891 Unspecified atrial fibrillation: Secondary | ICD-10-CM

## 2013-09-26 DIAGNOSIS — Z5181 Encounter for therapeutic drug level monitoring: Secondary | ICD-10-CM

## 2013-09-26 DIAGNOSIS — Z86718 Personal history of other venous thrombosis and embolism: Secondary | ICD-10-CM

## 2013-09-26 LAB — POCT INR: INR: 3.4

## 2013-10-10 ENCOUNTER — Ambulatory Visit (INDEPENDENT_AMBULATORY_CARE_PROVIDER_SITE_OTHER): Payer: No Typology Code available for payment source | Admitting: *Deleted

## 2013-10-10 DIAGNOSIS — Z86718 Personal history of other venous thrombosis and embolism: Secondary | ICD-10-CM

## 2013-10-10 DIAGNOSIS — Z5181 Encounter for therapeutic drug level monitoring: Secondary | ICD-10-CM

## 2013-10-10 DIAGNOSIS — I4891 Unspecified atrial fibrillation: Secondary | ICD-10-CM

## 2013-10-10 LAB — POCT INR: INR: 2.1

## 2013-10-24 ENCOUNTER — Other Ambulatory Visit: Payer: Self-pay | Admitting: Family

## 2013-10-24 ENCOUNTER — Ambulatory Visit (INDEPENDENT_AMBULATORY_CARE_PROVIDER_SITE_OTHER): Payer: No Typology Code available for payment source

## 2013-10-24 DIAGNOSIS — Z86718 Personal history of other venous thrombosis and embolism: Secondary | ICD-10-CM

## 2013-10-24 DIAGNOSIS — I4891 Unspecified atrial fibrillation: Secondary | ICD-10-CM

## 2013-10-24 DIAGNOSIS — Z5181 Encounter for therapeutic drug level monitoring: Secondary | ICD-10-CM

## 2013-10-24 LAB — POCT INR: INR: 2.4

## 2013-10-31 ENCOUNTER — Ambulatory Visit: Payer: Self-pay | Admitting: Family

## 2013-11-01 ENCOUNTER — Ambulatory Visit (INDEPENDENT_AMBULATORY_CARE_PROVIDER_SITE_OTHER): Payer: No Typology Code available for payment source | Admitting: Family

## 2013-11-01 ENCOUNTER — Encounter: Payer: Self-pay | Admitting: Family

## 2013-11-01 VITALS — BP 130/80 | HR 70 | Temp 98.0°F | Ht 64.0 in | Wt 210.0 lb

## 2013-11-01 DIAGNOSIS — F329 Major depressive disorder, single episode, unspecified: Secondary | ICD-10-CM

## 2013-11-01 DIAGNOSIS — E119 Type 2 diabetes mellitus without complications: Secondary | ICD-10-CM

## 2013-11-01 DIAGNOSIS — E785 Hyperlipidemia, unspecified: Secondary | ICD-10-CM

## 2013-11-01 DIAGNOSIS — I4891 Unspecified atrial fibrillation: Secondary | ICD-10-CM

## 2013-11-01 DIAGNOSIS — C50919 Malignant neoplasm of unspecified site of unspecified female breast: Secondary | ICD-10-CM

## 2013-11-01 DIAGNOSIS — F3289 Other specified depressive episodes: Secondary | ICD-10-CM

## 2013-11-01 DIAGNOSIS — E039 Hypothyroidism, unspecified: Secondary | ICD-10-CM

## 2013-11-01 LAB — BASIC METABOLIC PANEL WITH GFR
BUN: 7 mg/dL (ref 6–23)
CALCIUM: 9.4 mg/dL (ref 8.4–10.5)
CO2: 22 mEq/L (ref 19–32)
Chloride: 103 mEq/L (ref 96–112)
Creat: 0.63 mg/dL (ref 0.50–1.10)
GFR, Est African American: >89 mL/min
Glucose, Bld: 283 mg/dL — ABNORMAL HIGH (ref 70–99)
POTASSIUM: 4 meq/L (ref 3.5–5.3)
SODIUM: 136 meq/L (ref 135–145)

## 2013-11-01 LAB — HEMOGLOBIN A1C
HEMOGLOBIN A1C: 9.9 % — AB (ref <5.7)
MEAN PLASMA GLUCOSE: 237 mg/dL — AB (ref <117)

## 2013-11-01 LAB — TSH: TSH: 2.956 u[IU]/mL (ref 0.350–4.500)

## 2013-11-01 NOTE — Assessment & Plan Note (Signed)
Clinically stable on synthroid, check TSH.  

## 2013-11-01 NOTE — Assessment & Plan Note (Signed)
Depression/anxiety stable. Continue current meds.

## 2013-11-01 NOTE — Progress Notes (Signed)
Pre visit review using our clinic review tool, if applicable. No additional management support is needed unless otherwise documented below in the visit note. 

## 2013-11-01 NOTE — Assessment & Plan Note (Signed)
Clinically improved.  Keep DM eye exam, obtain A1c.  Continue onglyza and metformin

## 2013-11-01 NOTE — Assessment & Plan Note (Signed)
Pt to keep follow up with Dr. Jana Hakim.

## 2013-11-01 NOTE — Assessment & Plan Note (Signed)
Continue low cholesterol diet. Follow up in 3 months.

## 2013-11-01 NOTE — Progress Notes (Signed)
Subjective:    Patient ID: [name], female    DOB: 1956-02-23, 58 y.o.   MRN: [id number]  HPI  [name] is a 58 yr old female who presents today for follow up.  DM2-  Reports sugars have been about 120-150. Denies polyuria.  Has eye exam in July.     Hyperlipidemia- She is not on statin due to hx of liver disease. Has been working hard on diet. Snacks at night.    AF-  She is maintained on coumadin. She follows with coumadin clinic every 3 weeks.   Depression-she reports that this is well controlled.  She is maintained on zyprexa and lexapro.  She continues clonzepam bid which she reports controls anxiety.   Hx of breast Cancer- she follows with Dr. Jana Hakim and has follow up in August.   Hypothyroid- decreased synthroid in March- reports feeling well  On current dose.   Review of Systems See HPI  Past Medical History  Diagnosis Date  . Depression   . DVT (deep venous thrombosis)     hx of on HRT left leg ~2006  . GERD (gastroesophageal reflux disease)   . Hyperlipidemia   . Hypertension   . Hypothyroidism   . PPD positive, treated     rx inh   . OSA on CPAP   . Diabetes mellitus   . 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 Breast cancer     a. 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 DJD (degenerative joint disease) of lumbar spine   . Chronic diastolic CHF (congestive heart failure)     a. 08/2012 Echo: EF 55-60%, no rwma, mod MR.  [name] A-fib     a. on amio for rate control/coumadin.  [name] Physical deconditioning   . Allergy   . Anemia     low iron hx  . Blood transfusion without reported diagnosis     2 u prbc  . Cataract     removed ou    History   Social History  . Marital Status: Married    Spouse Name: N/A    Number of Children: 2  . Years of Education: N/A   Occupational History  . [employer]  History Main Topics  . Smoking status: Former Smoker -- 1.00 packs/day for 1 years  . Smokeless tobacco: Never Used  . Alcohol Use: No  . Drug Use: No     Comment: quit age 67 only smoked as a teen 1 year  . Sexual Activity: No   Other Topics Concern  . 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     Past Surgical History  Procedure Laterality Date  . Tubal ligation    . Cholecystectomy    . Foot surgery    . Eye surgery    . Cataract extraction    . Percutaneous liver biopsy    . Breast biopsy      left breast  . Mastectomy modified radical  06/08/2012    Procedure: MASTECTOMY MODIFIED RADICAL;  Surgeon: Edward Jolly,  MD;  Location: [facility];  Service: General;  Laterality: Right;  . Portacath placement  06/08/2012    Procedure: INSERTION PORT-A-CATH;  Surgeon: Edward Jolly, MD;  Location: [facility];  Service: General;  Laterality: Left;  . Peg tube placement      Family History  Problem Relation Age of Onset  . Diabetes Mother   . Hypertension Mother   . Arthritis Mother   . Heart disease Mother   . Heart failure Mother   . Other Mother     benign breast mass  . Stroke Father   . Heart disease Father   . Diabetes Paternal Grandmother   . Colon cancer Paternal Grandfather     Allergies  Allergen Reactions  . Olmesartan Medoxomil Cough    REACTION: ? if cough  . Tetracycline Hcl     Unknown reaction, too long for patient to remember   . Venlafaxine     REACTION: severe dry moouth  . Adhesive [Tape] Rash    Current Outpatient Prescriptions on File Prior to Visit  Medication Sig Dispense Refill  . buPROPion (WELLBUTRIN) 75 MG tablet TAKE 1 TABLET TWICE DAILY  60 tablet  3  . clonazePAM (KLONOPIN) 0.5 MG tablet 1/2 tab by mouth twice daily  60 tablet  0  . escitalopram (LEXAPRO)  10 MG tablet TAKE 1 TABLET NIGHTLY AT BEDTIME  30 tablet  3  . furosemide (LASIX) 20 MG tablet Take 1 tablet (20 mg total) by mouth daily.  30 tablet  3  . hyaluronate sodium (RADIAPLEXRX) GEL Apply topically 2 (two) times daily.      [name] letrozole (FEMARA) 2.5 MG tablet Take 1 tablet (2.5 mg total) by mouth daily.  30 tablet  12  . levothyroxine (SYNTHROID, LEVOTHROID) 150 MCG tablet Take 1 tablet (150 mcg total) by mouth daily.  30 tablet  2  . meclizine (ANTIVERT) 25 MG tablet Take 1 tablet (25 mg total) by mouth 3 (three) times daily as needed.  30 tablet  0  . metFORMIN (GLUCOPHAGE) 1000 MG tablet Take 1 tablet (1,000 mg total) by mouth 2 (two) times daily with a meal.  60 tablet  3  . metoprolol tartrate (LOPRESSOR) 25 MG tablet TAKE 1 TABLET BY MOUTH TWICE DAILY  60 tablet  2  . OLANZapine (ZYPREXA) 2.5 MG tablet TAKE 1 TABLET NIGHTLY AT BEDTIME AS NEEDED FOR SLEEP OR anxiety  30 tablet  0  . omeprazole (PRILOSEC) 40 MG capsule Take 1 capsule (40 mg total) by mouth daily.  30 capsule  5  . potassium chloride (K-DUR) 10 MEQ tablet Take 1 tablet (10 mEq total) by mouth 2 (two) times daily.  60 tablet  5  . saxagliptin HCl (ONGLYZA) 5 MG TABS tablet Take 1 tablet (5 mg total) by mouth daily.  30 tablet  3  . spironolactone (ALDACTONE) 25 MG tablet TAKE 1 TABLET BY MOUTH DAILY (DIURETIC)  30 tablet  2  . warfarin (COUMADIN) 3 MG tablet Take as directed by Anticoagulation clinic  70 tablet  3   No current facility-administered medications on file prior to visit.    BP 130/80  Pulse 70  Temp(Src) 98 F (36.7 C) (Oral)  Ht 5\' 4"  (1.626 m)  Wt 210 lb (95.255 kg)  BMI 36.03 kg/m2  SpO2 97%       Objective:   Physical Exam  Constitutional: She is oriented to person, place, and time. She appears well-developed and well-nourished. No distress.  Cardiovascular: Normal  rate and regular rhythm.   No murmur heard. Pulmonary/Chest: Effort normal and breath sounds normal. No respiratory  distress. She has no wheezes. She has no rales. She exhibits no tenderness.  Musculoskeletal: She exhibits no edema.  Neurological: She is alert and oriented to person, place, and time.  Psychiatric: She has a normal mood and affect. Her behavior is normal. Judgment and thought content normal.          Assessment & Plan:

## 2013-11-01 NOTE — Patient Instructions (Signed)
Please complete lab work prior to leaving.   Please schedule a follow up appointment in 3 months.  

## 2013-11-01 NOTE — Assessment & Plan Note (Signed)
Rate stable, continue coumadin- management per coumadin clinic.

## 2013-11-02 ENCOUNTER — Telehealth: Payer: Self-pay | Admitting: Family

## 2013-11-02 MED ORDER — CANAGLIFLOZIN 100 MG PO TABS: 1.0000 | ORAL_TABLET | Freq: Every day | ORAL | Status: DC

## 2013-11-02 NOTE — Telephone Encounter (Addendum)
LMOVM for pt to return call concerning lab results. When pt returns call, let her know there is a coupon card waiting for her. Coupon card is on [employer].

## 2013-11-02 NOTE — Telephone Encounter (Signed)
Sugar appears uncontrolled.  I would recommend that she add invokana.  Could you check if we have a coupon card for her please. Please ask pt to complete BMET in 1 week after starting, Dx is HTN.

## 2013-11-03 ENCOUNTER — Other Ambulatory Visit: Payer: Self-pay | Admitting: Family

## 2013-11-03 ENCOUNTER — Other Ambulatory Visit: Payer: Self-pay | Admitting: *Deleted

## 2013-11-03 ENCOUNTER — Telehealth: Payer: Self-pay | Admitting: Family

## 2013-11-03 DIAGNOSIS — I10 Essential (primary) hypertension: Secondary | ICD-10-CM

## 2013-11-03 MED ORDER — CANAGLIFLOZIN 100 MG PO TABS: 1.0000 | ORAL_TABLET | Freq: Every day | ORAL | Status: DC

## 2013-11-03 NOTE — Progress Notes (Signed)
OPEN IN ERROR 

## 2013-11-03 NOTE — Telephone Encounter (Signed)
Patient wants to know if it ok for her take ongylza and Invokana together. Please advise?

## 2013-11-03 NOTE — Telephone Encounter (Signed)
FYI: Rx sent to pharmacy per CAN note, pt informed & given Rx coupon card/SLS  [facility] Triage Call Report Triage Record Num: [id number] Operator: Agustina Caroli Patient Name: [name] Call Date & Time: 11/02/2013 7:09:50PM Patient Phone: [phone] PCP: Debbrah Alar, NP Patient Gender: Female PCP Fax : [phone] Patient DOB: Mar 16, 1956 Practice Name: [facility] Reason for Call: [name] states she stood up at [employer] and felt dizzy for a few minutes. No other signs of Ketoacidosis. States she checked her blood glucose which was 326. Concerned about blood sugar. Was seen in office on 6/23 and HGB A1C was 9.9. Blood glucose was 237 in office. Per diabetic control problems protocol has see provider within 4 hrs due to new or increasing symptoms or glucose out of control as defined by provider and taking medications as prescribed. Has had no more dizziness. Dr. Alain Marion notified of patient disposition and that patient was just seen in office on 11/01/13 and Hgb A1C is 9.9. Verbal order given to avoid Carbs and drink water. If new symptoms develop would need to be seen in UC/ED. Per EPIC, [name] notified that her "Sugar appears uncontrolled. Debbrah Alar would recommend that she add Invokana. Prescription was sent to [facility] in [place]. The office has a coupon card for [place] waiting at office on [name]'s desk. Instructed [name] to complete BMET in 1 week after starting, Dx is HTN. [name] will pick up coupon card on 11/03/13. Care advice given. Advised to callback if needed. Protocol(s) Used: Diabetes: Control Problems Recommended Outcome per Protocol: See Provider within 4 hours Reason for Outcome: New or increasing symptoms or glucose out of control as defined by provider or action plan AND taking medications/following therapy as prescribed Care Advice: ~ SYMPTOM / CONDITION MANAGEMENT ~ Check blood sugar and ketones before calling  provider Medication Advice: - Tell provider all prescription, nonprescription or alternative medications that you take. - Know possible side effects of medication and what to do If they occur. - Discontinue all nonprescription and alternative medications, especially stimulants, until evaluated by provider. ~ 11/02/2013 7:55:59PM Page 1 of 1 [id number]

## 2013-11-03 NOTE — Telephone Encounter (Signed)
Pt calling in regards to ongylza, request to speak to nurse will not cover

## 2013-11-03 NOTE — Telephone Encounter (Signed)
Please advise refills 

## 2013-11-04 NOTE — Telephone Encounter (Signed)
Yes, ok to take together

## 2013-11-04 NOTE — Telephone Encounter (Signed)
OK to send refills as below.  

## 2013-11-04 NOTE — Telephone Encounter (Signed)
Refills sent

## 2013-11-04 NOTE — Telephone Encounter (Signed)
Notified pt and she voices understanding. Lab order entered. 

## 2013-11-04 NOTE — Telephone Encounter (Signed)
Left message to return my call. Per 11/02/13 phone note, pt should return to the lab in 1 week for bmet for dx HTN.

## 2013-11-08 ENCOUNTER — Telehealth: Payer: Self-pay | Admitting: *Deleted

## 2013-11-08 NOTE — Telephone Encounter (Signed)
Form faxed to [place] at [phone]. Awaiting determination.

## 2013-11-08 NOTE — Telephone Encounter (Signed)
Received PA form for Invokana Rx. Form completed and forwarded to Provider for signature.

## 2013-11-10 ENCOUNTER — Encounter: Payer: Self-pay | Admitting: Family

## 2013-11-10 LAB — BASIC METABOLIC PANEL
BUN: 7 mg/dL (ref 6–23)
CALCIUM: 8.9 mg/dL (ref 8.4–10.5)
CO2: 25 mEq/L (ref 19–32)
CREATININE: 0.69 mg/dL (ref 0.50–1.10)
Chloride: 102 mEq/L (ref 96–112)
GLUCOSE: 196 mg/dL — AB (ref 70–99)
Potassium: 4 mEq/L (ref 3.5–5.3)
SODIUM: 137 meq/L (ref 135–145)

## 2013-11-14 ENCOUNTER — Ambulatory Visit (INDEPENDENT_AMBULATORY_CARE_PROVIDER_SITE_OTHER): Payer: No Typology Code available for payment source | Admitting: *Deleted

## 2013-11-14 DIAGNOSIS — I4891 Unspecified atrial fibrillation: Secondary | ICD-10-CM

## 2013-11-14 DIAGNOSIS — Z5181 Encounter for therapeutic drug level monitoring: Secondary | ICD-10-CM

## 2013-11-14 DIAGNOSIS — Z86718 Personal history of other venous thrombosis and embolism: Secondary | ICD-10-CM

## 2013-11-14 LAB — POCT INR: INR: 2.4

## 2013-11-14 NOTE — Telephone Encounter (Signed)
Received fax from [facility] that Anastasio Auerbach has been denied as her plan prefers her to try and fail at least 3 alternative formulary medications such as:  Acarbose, byetta, bydureon, chlorpropamide, glimepiried, glipizide, glipizide/metformin, glyburide, glyburide/metformin, kombliglyze, januvia, avandamet, avandryl, cycloset, glyset, humalog, levemir, metformin, pioglitazone, repaglinide, tradjenta or tolazamide.   Please advise.

## 2013-11-15 ENCOUNTER — Other Ambulatory Visit: Payer: Self-pay | Admitting: Family

## 2013-11-15 MED ORDER — EXENATIDE ER 2 MG ~~LOC~~ PEN: 2.0000 mg | PEN_INJECTOR | SUBCUTANEOUS | Status: DC

## 2013-11-15 NOTE — Telephone Encounter (Signed)
Please contact pt and let her know that her insurance will not cover invokana.  I recommend bydureon. This is a once weekly injection. She should bring to office for nurse visit teaching please.

## 2013-11-15 NOTE — Telephone Encounter (Signed)
Left message for pt to return my call.

## 2013-11-16 MED ORDER — SAXAGLIPTIN-METFORMIN ER 2.5-1000 MG PO TB24: 2.0000 | ORAL_TABLET | Freq: Every day | ORAL | Status: DC

## 2013-11-16 NOTE — Telephone Encounter (Signed)
Returning call, [phone]

## 2013-11-16 NOTE — Telephone Encounter (Signed)
D/c onglyza, d/c metformin. Change to Kombiglyze 2.09/998 2 tabs PO daily. This has metformin and onglyza and is on list below.

## 2013-11-16 NOTE — Telephone Encounter (Signed)
Spoke with pt, she has contacted a Research scientist (physical sciences) and was told she can get invokana free for 1 year. Pt will take information to her pharmacy. Bydureon rx cancelled. Pt also states that insurance is not wanting to pay for onglyza ($300). Please advise if one of the below alternatives would be appropriate?

## 2013-11-17 NOTE — Telephone Encounter (Signed)
Patient informed and voiced understanding

## 2013-11-22 ENCOUNTER — Telehealth: Payer: Self-pay | Admitting: *Deleted

## 2013-11-22 NOTE — Telephone Encounter (Signed)
Received fax from [facility] requesting prior authorization for [facility] Rx even though it was listed as a preferred alternative. Form completed and forwarded to PR[employer] for signature.

## 2013-11-23 NOTE — Telephone Encounter (Signed)
Completed form faxed to [place] at [phone]. Awaiting determination.

## 2013-11-30 NOTE — Telephone Encounter (Signed)
Yes please continue both

## 2013-11-30 NOTE — Telephone Encounter (Signed)
Received authorization good from 11/24/13 through 11/24/16. Notified pt and Robert at pharmacy. Pt wants to know if she is supposed to continue Invokana along with Kombiglyze. If so, she will proceed with online discount.  Please advise.

## 2013-11-30 NOTE — Telephone Encounter (Signed)
Notified pt and she voices understanding. 

## 2013-12-07 ENCOUNTER — Telehealth: Payer: Self-pay | Admitting: *Deleted

## 2013-12-07 MED ORDER — ESCITALOPRAM OXALATE 20 MG PO TABS: 20.0000 mg | ORAL_TABLET | Freq: Every day | ORAL | Status: DC

## 2013-12-07 MED ORDER — GLIMEPIRIDE 2 MG PO TABS: 2.0000 mg | ORAL_TABLET | Freq: Every day | ORAL | Status: DC

## 2013-12-07 MED ORDER — METFORMIN HCL 1000 MG PO TABS: 1000.0000 mg | ORAL_TABLET | Freq: Two times a day (BID) | ORAL | Status: DC

## 2013-12-07 NOTE — Telephone Encounter (Signed)
Received message from pt that insurance is not paying for [facility] and pharmacy will not accept her discount card for invokana. Spoke with pharmacy and verified that insurance will cover kombiglyze but pt has a deductible for medication and kombiglyze cost of $300 is due to her deductible. Please advise re: cheaper alternatives for kombiglyze and invokana. Also pt states she is having increased stress with her daughter and would like to have the lexapro increased like you suggested at her last visit?  Please advise.

## 2013-12-07 NOTE — Telephone Encounter (Signed)
D/c combiglyze, d/c invokana. Start Metformin 1000mg  bid + amaryl 2mg  once daily. Work hard on diabetic diet and exercise.  Increase lexapro to 20mg . Follow up in 1 month.

## 2013-12-07 NOTE — Telephone Encounter (Signed)
Notified pt and she voices understanding. 

## 2013-12-10 ENCOUNTER — Other Ambulatory Visit: Payer: Self-pay | Admitting: Family

## 2013-12-10 ENCOUNTER — Other Ambulatory Visit: Payer: Self-pay | Admitting: Physician Assistant

## 2013-12-12 NOTE — Telephone Encounter (Signed)
OK to send 30 tabs zero refills.  

## 2013-12-12 NOTE — Telephone Encounter (Signed)
eScribe request from [facility] for refill on Olanzapine 2.5 mg Last filled - 06.05.15, #30x0 Last AEX - 06.23.15 Next AEX - 3 Mths. Please Advise on refills/SLS

## 2013-12-13 NOTE — Telephone Encounter (Signed)
Refill sent.

## 2013-12-19 ENCOUNTER — Ambulatory Visit (INDEPENDENT_AMBULATORY_CARE_PROVIDER_SITE_OTHER): Payer: No Typology Code available for payment source | Admitting: *Deleted

## 2013-12-19 DIAGNOSIS — Z5181 Encounter for therapeutic drug level monitoring: Secondary | ICD-10-CM

## 2013-12-19 DIAGNOSIS — I4891 Unspecified atrial fibrillation: Secondary | ICD-10-CM

## 2013-12-19 DIAGNOSIS — Z86718 Personal history of other venous thrombosis and embolism: Secondary | ICD-10-CM

## 2013-12-19 LAB — POCT INR: INR: 3.4

## 2013-12-23 ENCOUNTER — Telehealth: Payer: Self-pay | Admitting: Family

## 2013-12-23 NOTE — Telephone Encounter (Signed)
Pt requesting status of disability form. Advised pt that form will be completed today. She states she will pick it up on Monday.

## 2013-12-23 NOTE — Telephone Encounter (Signed)
Form completed.

## 2013-12-23 NOTE — Telephone Encounter (Signed)
Patient called again regarding this.

## 2013-12-23 NOTE — Telephone Encounter (Signed)
Patient left message requesting status of her insurance form that she dropped off

## 2013-12-23 NOTE — Telephone Encounter (Signed)
Original placed at front desk for pt pick up and copy sent for scanning.

## 2013-12-27 ENCOUNTER — Ambulatory Visit (HOSPITAL_COMMUNITY)
Admission: RE | Admit: 2013-12-27 | Discharge: 2013-12-27 | Disposition: A | Discharge status: Home / Self Care | Payer: No Typology Code available for payment source | Source: Ambulatory Visit | Attending: Oncology | Admitting: Oncology

## 2013-12-27 ENCOUNTER — Other Ambulatory Visit (HOSPITAL_BASED_OUTPATIENT_CLINIC_OR_DEPARTMENT_OTHER): Payer: No Typology Code available for payment source

## 2013-12-27 ENCOUNTER — Ambulatory Visit (HOSPITAL_BASED_OUTPATIENT_CLINIC_OR_DEPARTMENT_OTHER): Payer: No Typology Code available for payment source | Admitting: Oncology

## 2013-12-27 VITALS — BP 153/71 | HR 69 | Temp 97.8°F | Resp 20 | Ht 64.0 in | Wt 211.7 lb

## 2013-12-27 DIAGNOSIS — M5137 Other intervertebral disc degeneration, lumbosacral region: Secondary | ICD-10-CM | POA: Diagnosis not present

## 2013-12-27 DIAGNOSIS — M1610 Unilateral primary osteoarthritis, unspecified hip: Secondary | ICD-10-CM | POA: Diagnosis not present

## 2013-12-27 DIAGNOSIS — Z853 Personal history of malignant neoplasm of breast: Secondary | ICD-10-CM | POA: Diagnosis not present

## 2013-12-27 DIAGNOSIS — M545 Low back pain, unspecified: Secondary | ICD-10-CM | POA: Diagnosis present

## 2013-12-27 DIAGNOSIS — C50919 Malignant neoplasm of unspecified site of unspecified female breast: Secondary | ICD-10-CM

## 2013-12-27 DIAGNOSIS — C773 Secondary and unspecified malignant neoplasm of axilla and upper limb lymph nodes: Secondary | ICD-10-CM

## 2013-12-27 DIAGNOSIS — D696 Thrombocytopenia, unspecified: Secondary | ICD-10-CM

## 2013-12-27 DIAGNOSIS — C50019 Malignant neoplasm of nipple and areola, unspecified female breast: Secondary | ICD-10-CM

## 2013-12-27 DIAGNOSIS — M51379 Other intervertebral disc degeneration, lumbosacral region without mention of lumbar back pain or lower extremity pain: Secondary | ICD-10-CM | POA: Insufficient documentation

## 2013-12-27 DIAGNOSIS — N189 Chronic kidney disease, unspecified: Secondary | ICD-10-CM

## 2013-12-27 DIAGNOSIS — I700 Atherosclerosis of aorta: Secondary | ICD-10-CM | POA: Insufficient documentation

## 2013-12-27 DIAGNOSIS — D259 Leiomyoma of uterus, unspecified: Secondary | ICD-10-CM | POA: Insufficient documentation

## 2013-12-27 DIAGNOSIS — Z170 Estrogen receptor positive status [ER+]: Secondary | ICD-10-CM

## 2013-12-27 DIAGNOSIS — C50911 Malignant neoplasm of unspecified site of right female breast: Secondary | ICD-10-CM

## 2013-12-27 LAB — CBC WITH DIFFERENTIAL/PLATELET
BASO%: 0.7 % (ref 0.0–2.0)
Basophils Absolute: 0 10*3/uL (ref 0.0–0.1)
EOS%: 2 % (ref 0.0–7.0)
Eosinophils Absolute: 0.1 10*3/uL (ref 0.0–0.5)
HCT: 43.1 % (ref 34.8–46.6)
HGB: 14.1 g/dL (ref 11.6–15.9)
LYMPH%: 22 % (ref 14.0–49.7)
MCH: 29.5 pg (ref 25.1–34.0)
MCHC: 32.6 g/dL (ref 31.5–36.0)
MCV: 90.5 fL (ref 79.5–101.0)
MONO#: 0.4 10*3/uL (ref 0.1–0.9)
MONO%: 7.1 % (ref 0.0–14.0)
NEUT#: 3.8 10*3/uL (ref 1.5–6.5)
NEUT%: 68.2 % (ref 38.4–76.8)
Platelets: 138 10*3/uL — ABNORMAL LOW (ref 145–400)
RBC: 4.76 10*6/uL (ref 3.70–5.45)
RDW: 15 % — AB (ref 11.2–14.5)
WBC: 5.6 10*3/uL (ref 3.9–10.3)
lymph#: 1.2 10*3/uL (ref 0.9–3.3)

## 2013-12-27 LAB — COMPREHENSIVE METABOLIC PANEL (CC13)
ALBUMIN: 3.2 g/dL — AB (ref 3.5–5.0)
ALK PHOS: 145 U/L (ref 40–150)
ALT: 51 U/L (ref 0–55)
AST: 85 U/L — AB (ref 5–34)
Anion Gap: 9 mEq/L (ref 3–11)
BUN: 6.9 mg/dL — ABNORMAL LOW (ref 7.0–26.0)
CO2: 23 mEq/L (ref 22–29)
Calcium: 9.4 mg/dL (ref 8.4–10.4)
Chloride: 111 mEq/L — ABNORMAL HIGH (ref 98–109)
Creatinine: 0.7 mg/dL (ref 0.6–1.1)
Glucose: 74 mg/dl (ref 70–140)
Potassium: 3.9 mEq/L (ref 3.5–5.1)
SODIUM: 143 meq/L (ref 136–145)
TOTAL PROTEIN: 7 g/dL (ref 6.4–8.3)
Total Bilirubin: 0.69 mg/dL (ref 0.20–1.20)

## 2013-12-27 MED ORDER — TRAMADOL HCL 50 MG PO TABS: 50.0000 mg | ORAL_TABLET | Freq: Four times a day (QID) | ORAL | Status: DC | PRN

## 2013-12-27 NOTE — Addendum Note (Signed)
Addended by: Amelia Jo I on: 12/27/2013 04:06 PM   Modules accepted: Orders, Medications

## 2013-12-27 NOTE — Progress Notes (Signed)
ID: [name]   DOB: 23-Apr-1956  MR#: [id number]  PQZ#:[id number]  PCP: [name], NP GYN:  SU: [facility] OTHER FH:LKTGYB [name], Shanon Ace, Roney Jaffe  CHIEF COMPLAINT:  Right Breast Cancer CURRENT TREATMENT: Letrozole   HISTORY OF PRESENT ILLNESS: From the original consult note:  Lateefah noted a mass in her right breast mid December, and as it did not spontaneously resolve over a couple of weeks she brought it to her primary physician's attention. She was set up for diagnostic mammography and right breast ultrasonography at the breast Center 05/10/2012. (Note that the patient's most recent prior mammography had been in October 2008). The current study showed a spiculated mass in the superior subareolar portion of the right breast measuring approximately 5 cm and associated with pleomorphic calcifications. This was firm and palpable. There was right nipple retraction and skin thickening. Ultrasound confirmed an irregularly marginated hypoechoic mass measuring 3.5 cm by ultrasound, and an abnormal appearing lower right axillary lymph node measuring 2.6 cm.  Biopsies of both the breast mass and the abnormal appearing lymph node were performed 05/21/2012. Both showed an invasive ductal carcinoma, grade 2, with similar prognostic panels (the breast mass was 100% estrogen and 73% progesterone receptor positive, with an MIB-1 of 5%; the lymph node was 100% estrogen 100% progesterone receptor positive, with an MIB-1 of 20%). Both masses were HER-2 negative.  Breast MRI obtained at [facility] imaging 05/29/2012 confirmed a dominant mass in the retroareolar right breast measuring 4.4 cm maximally. There was a satellite nodule inferior and lateral to this mass, measuring 1.7 cm. There were no other masses in either breast. Aside from the previously biopsied lymph node there were other mildly enhancing level I right axillary lymph nodes which did not appear  pathologic. There was no other lymphadenopathy noted.   The patient's subsequent history is as detailed below.   INTERVAL HISTORY: Tawania returns today for followup of her right breast cancer. Interval history is significant for [name] having fallen from a 20 foot ladder and broke in both her ankles. He is pretty much in bed all the time and Sharlot is now his primary caregiver. In addition, one of her daughters, her daughter son and her daughter's 2 children are living with her "until they get on their feet". ROS is fairly overwhelming to [place]. She continues on letrozole, with no side effects from that medication that she is aware of.  REVIEW OF SYSTEMS: Mychele  Complains of insomnia, mild to moderate fatigue, and back pain which is in the lumbar area and occurs when she is standing for long periods. When she sits down and weights a little the patient is goes away. When she is walking any distance after while her legs get week. They don't "give out". There have been no falls. She just feels like she needs to sit down. Aside from that a detailed review of systems today was noncontributory  PAST MEDICAL HISTORY: Past Medical History  Diagnosis Date  . Depression   . DVT (deep venous thrombosis)     hx of on HRT left leg ~2006  . GERD (gastroesophageal reflux disease)   . Hyperlipidemia   . Hypertension   . Hypothyroidism   . PPD positive, treated     rx inh   . OSA on CPAP   . Diabetes mellitus   . 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 Breast cancer     a. 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 DJD (degenerative joint disease) of lumbar spine   . Chronic diastolic CHF (congestive heart failure)     a. 08/2012 Echo: EF 55-60%, no rwma, mod MR.  [name] A-fib     a. on amio for rate control/coumadin.  [name] Physical deconditioning   . Allergy   . Anemia     low iron  hx  . Blood transfusion without reported diagnosis     2 u prbc  . Cataract     removed ou    PAST SURGICAL HISTORY: Past Surgical History  Procedure Laterality Date  . Tubal ligation    . Cholecystectomy    . Foot surgery    . Eye surgery    . Cataract extraction    . Percutaneous liver biopsy    . Breast biopsy      left breast  . Mastectomy modified radical  06/08/2012    Procedure: MASTECTOMY MODIFIED RADICAL;  Surgeon: Edward Jolly, MD;  Location: [facility];  Service: General;  Laterality: Right;  . Portacath placement  06/08/2012    Procedure: INSERTION PORT-A-CATH;  Surgeon: Edward Jolly, MD;  Location[facility];  Service: General;  Laterality: Left;  . Peg tube placement      FAMILY HISTORY Family History  Problem Relation Age of Onset  . Diabetes Mother   . Hypertension Mother   . Arthritis Mother   . Heart disease Mother   . Heart failure Mother   . Other Mother     benign breast mass  . Stroke Father   . Heart disease Father   . Diabetes Paternal Grandmother   . Colon cancer Paternal Grandfather    the patient's father died in his 60s with a history of dementia. He had had prior strokes. The patient's mother died in her 60s, with a history of congestive heart failure. [place] had no brothers, one sister. There is no history of breast or ovarian cancer in the family.  GYNECOLOGIC HISTORY: Menarche age 58, first live birth age 58, she is GX P2, menopause approximately 15 years ago, on hormone replacement until 2010.  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 Her husband [name] works for [employer]. Daughter Paul Half is a Physiological scientist and lives in [place]. Daughter Santiago Bur and her family (husband and 2 children aged 50 and 1-1/2 years) currently live with the patient. Juliana is a member of a [employer].   ADVANCED  DIRECTIVES: Not in place  HEALTH MAINTENANCE: (Updated October 2004) History  Substance Use Topics  . Smoking status: Former Smoker -- 1.00 packs/day for 1 years  . Smokeless tobacco: Never Used  . Alcohol Use: No     Colonoscopy: Never  PAP: Does not recall  Bone density: Never  Lipid panel:   Allergies  Allergen Reactions  . Olmesartan Medoxomil Cough    REACTION: ? if cough  . Tetracycline Hcl     Unknown reaction, too long for patient to remember   . Venlafaxine     REACTION: severe dry moouth  . Adhesive [Tape] Rash    Current Outpatient Prescriptions  Medication Sig Dispense Refill  . buPROPion (WELLBUTRIN) 75 MG tablet TAKE 1 TABLET TWICE DAILY  60 tablet  3  . clonazePAM (KLONOPIN) 0.5 MG tablet TAKE  1/2 TABLET TWICE DAILY  60 tablet  0  . escitalopram (LEXAPRO) 20 MG tablet Take 1 tablet (20 mg total) by mouth daily.  30 tablet  1  . furosemide (LASIX) 20 MG tablet Take 1 tablet (20 mg total) by mouth daily.  30 tablet  3  . glimepiride (AMARYL) 2 MG tablet Take 1 tablet (2 mg total) by mouth daily before breakfast.  30 tablet  3  . hyaluronate sodium (RADIAPLEXRX) GEL Apply topically 2 (two) times daily.      [name] letrozole (FEMARA) 2.5 MG tablet Take 1 tablet (2.5 mg total) by mouth daily.  30 tablet  12  . levothyroxine (SYNTHROID, LEVOTHROID) 150 MCG tablet Take 1 tablet (150 mcg total) by mouth daily.  30 tablet  1  . meclizine (ANTIVERT) 25 MG tablet Take 1 tablet (25 mg total) by mouth 3 (three) times daily as needed.  30 tablet  0  . metFORMIN (GLUCOPHAGE) 1000 MG tablet Take 1 tablet (1,000 mg total) by mouth 2 (two) times daily with a meal.  60 tablet  2  . metoprolol tartrate (LOPRESSOR) 25 MG tablet TAKE 1 TABLET BY MOUTH TWICE DAILY  60 tablet  1  . OLANZapine (ZYPREXA) 2.5 MG tablet TAKE 1 TABLET NIGHTLY AT BEDTIME AS NEEDED FOR SLEEP OR anxiety  30 tablet  0  . omeprazole (PRILOSEC) 40 MG capsule Take 1 capsule (40 mg total) by mouth daily.  30 capsule  5   . potassium chloride (K-DUR) 10 MEQ tablet Take 1 tablet (10 mEq total) by mouth 2 (two) times daily.  60 tablet  5  . spironolactone (ALDACTONE) 25 MG tablet TAKE 1 TABLET BY MOUTH DAILY (DIURETIC)  30 tablet  2  . warfarin (COUMADIN) 3 MG tablet Take as directed by Anticoagulation clinic  70 tablet  3   No current facility-administered medications for this visit.    OBJECTIVE: Middle-aged white woman in no acute distress  Filed Vitals:   12/27/13 1405  BP: 153/71  Pulse: 69  Temp: 97.8 F (36.6 C)  Resp: 20     Body mass index is 36.32 kg/(m^2).    ECOG FS: 1 Filed Weights   12/27/13 1405  Weight: 211 lb 11.2 oz (96.026 kg)   Sclerae unicteric, pupils equal and reactive Oropharynx clear and moist No cervical or supraclavicular adenopathy Lungs no rales or rhonchi Heart regular rate and rhythm Abd soft, obese, nontender, positive bowel sounds MSK no focal spinal tenderness, no upper extremity lymphedema Neuro: nonfocal, well oriented, appropriate affect Breasts: The right breast is status post mastectomy. There is no evidence of chest wall recurrence. The right axilla is benign. The left breast is unremarkable.    LAB RESULTS: Lab Results  Component Value Date   WBC 5.6 12/27/2013   NEUTROABS 3.8 12/27/2013   HGB 14.1 12/27/2013   HCT 43.1 12/27/2013   MCV 90.5 12/27/2013   PLT 138* 12/27/2013      Chemistry      Component Value Date/Time   NA 143 12/27/2013 1322   NA 137 11/10/2013 0828   K 3.9 12/27/2013 1322   K 4.0 11/10/2013 0828   CL 102 11/10/2013 0828   CL 104 10/29/2012 1058   CO2 23 12/27/2013 1322   CO2 25 11/10/2013 0828   BUN 6.9* 12/27/2013 1322   BUN 7 11/10/2013 0828   CREATININE 0.7 12/27/2013 1322   CREATININE 0.69 11/10/2013 0828   CREATININE 0.6 10/15/2012 1151      Component  Value Date/Time   CALCIUM 9.4 12/27/2013 1322   CALCIUM 8.9 11/10/2013 0828   ALKPHOS 145 12/27/2013 1322   ALKPHOS 134* 07/25/2013 0859   AST 85* 12/27/2013 1322   AST 58* 07/25/2013 0859    ALT 51 12/27/2013 1322   ALT 40* 07/25/2013 0859   BILITOT 0.69 12/27/2013 1322   BILITOT 0.8 07/25/2013 0859       STUDIES:  EXAM:  DUAL X-RAY ABSORPTIOMETRY (DXA) FOR BONE MINERAL DENSITY  COMPARISON: None.  FINDINGS:  AP LUMBAR SPINE  Bone Mineral Density (BMD): 0.898 g/cm2  Young Adult T Score: -1.4  Z Score: 0.0  RIGHT FEMUR NECK  Bone Mineral Density (BMD): 0.736 g/cm2  Young Adult T Score: -1.0  Z Score: 0.2  ASSESSMENT: Patient's diagnostic category is LOW BONE MASS  (OSTEOPENIA) by WHO Criteria.   EXAM:  LEFT DIGITAL SCREENING MAMMOGRAM WITH CAD  COMPARISON: Previous exam(s)  ACR Breast Density Category c: The breasts tissue is heterogeneously  dense, which may obscure small masses.  FINDINGS:  No suspicious masses, architectural distortion, or calcifications  are present. There are no findings suspicious for malignancy. The  patient has had a right mastectomy.  Images were processed with CAD.  IMPRESSION:  No mammographic evidence of malignancy. A result letter of this  screening mammogram will be mailed directly to the patient.  RECOMMENDATION:  Screening mammogram in one year. (Code:SM-B-01Y)  BI-RADS CATEGORY 1: Negative  Electronically Signed  By: Shon Hale M.D.  On: 05/11/2013 15:23     ASSESSMENT: 59 y.o. [name]   (1)  status post right mastectomy under the care of Dr. Excell Seltzer on 06/08/2012 for a pT3, pN1a, stage IIIA invasive ductal carcinoma, grade 2,  estrogen and progesterone receptor positive at 100%, HER-2/neu negative, with MIB-1 of 20%.    (2)  treated in the adjuvant setting with docetaxel/cyclophosphamide given every 3 weeks. 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   PLAN: Atoya looks well, though somewhat overwhelmed by Don's medical problems.  we discussed her situation for approximately 30 minutes today. Her backpain does sound fairly classic for arthritis, and we are going to obtain some plain films today to see if we can document that. If there is any question we will likely proceed to either a bone scan or an MRI of the thoracolumbar spine.  Symptomatically I suggested she try tramadol. This will not affect  her  liver the way Tylenol can and it will not affect her clotting or kidneys. It may well constipate her and we discussed what to do regarding that.  Otherwise Seana is to return to see me in 3 months. She knows to call for any problems that may develop before that visit.   Chauncey Cruel, MD      12/27/2013

## 2013-12-28 ENCOUNTER — Other Ambulatory Visit: Payer: Self-pay | Admitting: *Deleted

## 2013-12-28 DIAGNOSIS — C50919 Malignant neoplasm of unspecified site of unspecified female breast: Secondary | ICD-10-CM

## 2013-12-28 MED ORDER — TRAMADOL HCL 50 MG PO TABS: 50.0000 mg | ORAL_TABLET | Freq: Four times a day (QID) | ORAL | Status: DC | PRN

## 2013-12-30 ENCOUNTER — Telehealth: Payer: Self-pay | Admitting: Oncology

## 2013-12-30 NOTE — Telephone Encounter (Signed)
s.w. pt and advised on NOV apt...pt ok adn aware

## 2013-12-30 NOTE — Telephone Encounter (Signed)
Faxed pt medical records to [facility] °

## 2014-01-02 ENCOUNTER — Telehealth: Payer: Self-pay | Admitting: *Deleted

## 2014-01-02 NOTE — Telephone Encounter (Signed)
Called patient with results of lumbar spine films"no cancer". Results sent to PCP Debbrah Alar.

## 2014-01-07 ENCOUNTER — Other Ambulatory Visit: Payer: Self-pay | Admitting: Family

## 2014-01-09 ENCOUNTER — Ambulatory Visit (INDEPENDENT_AMBULATORY_CARE_PROVIDER_SITE_OTHER): Payer: No Typology Code available for payment source | Admitting: *Deleted

## 2014-01-09 DIAGNOSIS — I4891 Unspecified atrial fibrillation: Secondary | ICD-10-CM

## 2014-01-09 DIAGNOSIS — Z5181 Encounter for therapeutic drug level monitoring: Secondary | ICD-10-CM

## 2014-01-09 DIAGNOSIS — Z86718 Personal history of other venous thrombosis and embolism: Secondary | ICD-10-CM

## 2014-01-09 LAB — POCT INR: INR: 1.7

## 2014-01-10 NOTE — Telephone Encounter (Signed)
Spoke with pt and verified that she is still taking both medications. States no changes have been made to her medications and Cancer center told her that her labs were fine. Refills sent.

## 2014-01-10 NOTE — Telephone Encounter (Signed)
Pharmacy requesting refills of furosemide and potassium.  Current med list only lists tramadol. Did all of her other meds fall off the list? Is it ok to send refills on these meds?

## 2014-01-10 NOTE — Telephone Encounter (Signed)
Please contact pt and see if she has been taking. If so, ok to continue.  I see that the cancer center RN took the lasix off of the list.

## 2014-01-12 ENCOUNTER — Other Ambulatory Visit: Payer: Self-pay | Admitting: Family

## 2014-01-13 NOTE — Telephone Encounter (Signed)
Ok to send refill  

## 2014-01-13 NOTE — Telephone Encounter (Signed)
Please advise refill:  Medication name:  Name from pharmacy:  clonazePAM (KLONOPIN) 0.5 MG tablet  CLONAZEPAM 0.5 MG TABLET Sig: TAKE 1/2 TABLET TWICE DAILY Dispense: 60 tablet Start: 01/12/2014 Class: Normal Requested on: 11/04/2013 Last refill: 12/10/2013

## 2014-01-13 NOTE — Telephone Encounter (Signed)
Refill called to pharmacist.

## 2014-01-18 ENCOUNTER — Other Ambulatory Visit: Payer: Self-pay | Admitting: Family

## 2014-01-18 NOTE — Telephone Encounter (Signed)
Requests from pharmacy. Rxs not on current list. Please advise:  Medication name:  Name from pharmacy:  metoprolol tartrate (LOPRESSOR) 25 MG tablet  METOPROLOL TARTRATE 25 MG TABLET The source prescription has been discontinued. Sig: TAKE 1 TABLET BY MOUTH TWICE DAILY Dispense: 60 tablet Start: 01/18/2014 Class: Normal Requested on: 11/15/2013 Originally ordered on: 09/01/2012 Last refill: 12/19/2013 Order History and Details   Medication name:  Name from pharmacy:  levothyroxine (SYNTHROID, LEVOTHROID) 150 MCG tablet  LEVOTHYROXINE SODIUM 150 MCG TABLET The source prescription has been discontinued. Sig: TAKE 1 TABLET EVERY DAY Dispense: 30 tablet Start: 01/18/2014 Class: Normal Requested on: 11/15/2013 Originally ordered on: 07/26/2013 Last refill: 12/19/2013

## 2014-01-18 NOTE — Telephone Encounter (Signed)
Ok to refill 

## 2014-01-18 NOTE — Telephone Encounter (Signed)
Refill sent.

## 2014-01-24 ENCOUNTER — Encounter: Payer: Self-pay | Admitting: Family

## 2014-01-24 ENCOUNTER — Ambulatory Visit (INDEPENDENT_AMBULATORY_CARE_PROVIDER_SITE_OTHER): Payer: No Typology Code available for payment source | Admitting: Pharmacist

## 2014-01-24 ENCOUNTER — Ambulatory Visit (INDEPENDENT_AMBULATORY_CARE_PROVIDER_SITE_OTHER): Payer: No Typology Code available for payment source | Admitting: Family

## 2014-01-24 VITALS — BP 135/62 | HR 78 | Temp 98.1°F | Resp 16 | Ht 64.0 in | Wt 209.8 lb

## 2014-01-24 DIAGNOSIS — I5032 Chronic diastolic (congestive) heart failure: Secondary | ICD-10-CM

## 2014-01-24 DIAGNOSIS — F3289 Other specified depressive episodes: Secondary | ICD-10-CM

## 2014-01-24 DIAGNOSIS — Z5181 Encounter for therapeutic drug level monitoring: Secondary | ICD-10-CM

## 2014-01-24 DIAGNOSIS — E119 Type 2 diabetes mellitus without complications: Secondary | ICD-10-CM

## 2014-01-24 DIAGNOSIS — Z23 Encounter for immunization: Secondary | ICD-10-CM

## 2014-01-24 DIAGNOSIS — F329 Major depressive disorder, single episode, unspecified: Secondary | ICD-10-CM

## 2014-01-24 DIAGNOSIS — I4891 Unspecified atrial fibrillation: Secondary | ICD-10-CM

## 2014-01-24 DIAGNOSIS — E785 Hyperlipidemia, unspecified: Secondary | ICD-10-CM

## 2014-01-24 DIAGNOSIS — E039 Hypothyroidism, unspecified: Secondary | ICD-10-CM

## 2014-01-24 DIAGNOSIS — Z86718 Personal history of other venous thrombosis and embolism: Secondary | ICD-10-CM

## 2014-01-24 LAB — POCT INR: INR: 3.5

## 2014-01-24 LAB — HEMOGLOBIN A1C: Hgb A1c MFr Bld: 6.8 % — ABNORMAL HIGH (ref 4.6–6.5)

## 2014-01-24 NOTE — Assessment & Plan Note (Signed)
Above goal, but trying to avoid statins due to hx of liver disease.

## 2014-01-24 NOTE — Patient Instructions (Signed)
Please complete lab work prior to leaving. Follow up in 3 months.  

## 2014-01-24 NOTE — Progress Notes (Signed)
Subjective:    Patient ID: [name], female    DOB: 02-Dec-1955, 58 y.o.   MRN: [id number]  HPI  [name] is a 58 yr old female who presents today for follow up of multiple medical problems:  1) DM2- last visit she was noted to have uncontrolled A1C.  She could not afford onglyza. She could not afford invokana either.  She is on amaryl and metformin and is watching her diet. Reports sugars "about 200." Lab Results  Component Value Date   HGBA1C 9.9* 11/01/2013   HGBA1C 9.8* 07/25/2013   HGBA1C 6.9* 04/15/2013   Lab Results  Component Value Date   MICROALBUR 1.56 07/25/2013   LDLCALC 136* 07/25/2013   CREATININE 0.7 12/27/2013   2) Hyperlipidemia- She is not currently on statin due to hx of liver disease.  3) Depression- reports mood is better. + med compliance, sleeping well.    4) Hypothyroid- reports feeling stable on synthroid.   Lab Results  Component Value Date   TSH 2.956 11/01/2013     Review of Systems See HPI  Past Medical History  Diagnosis Date  . Depression   . DVT (deep venous thrombosis)     hx of on HRT left leg ~2006  . GERD (gastroesophageal reflux disease)   . Hyperlipidemia   . Hypertension   . Hypothyroidism   . PPD positive, treated     rx inh   . OSA on CPAP   . Diabetes mellitus   . 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 Breast cancer     a. 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 DJD (degenerative joint disease) of lumbar spine   . Chronic diastolic CHF (congestive heart failure)     a. 08/2012 Echo: EF 55-60%, no rwma, mod MR.  [name] A-fib     a. on amio for rate control/coumadin.  [name] Physical deconditioning   . Allergy   . Anemia     low iron hx  . Blood transfusion without reported diagnosis     2 u prbc  . Cataract     removed ou    History   Social History  . Marital Status: Married    Spouse  Name: N/A    Number of Children: 2  . Years of Education: N/A   Occupational History  . [place] History Main Topics  . Smoking status: Former Smoker -- 1.00 packs/day for 1 years  . Smokeless tobacco: Never Used  . Alcohol Use: No  . Drug Use: No     Comment: quit age 40 only smoked as a teen 1 year  . Sexual Activity: No   Other Topics Concern  . Not on file   Social History Narrative   Married   Works at [employer] secretary   Daily caffeine use - 2 cups a day plus a couple of sodas a day   Pt doesn't exercise regularly   G2P2   H H of 5 soon to be 6 .     Past Surgical History  Procedure Laterality Date  . Tubal ligation    . Cholecystectomy    . Foot surgery    . Eye surgery    . Cataract extraction    . Percutaneous liver biopsy    . Breast biopsy  left breast  . Mastectomy modified radical  06/08/2012    Procedure: MASTECTOMY MODIFIED RADICAL;  Surgeon: Edward Jolly, MD;  Location: [facility];  Service: General;  Laterality: Right;  . Portacath placement  06/08/2012    Procedure: INSERTION PORT-A-CATH;  Surgeon: Edward Jolly, MD;  [facility];  Service: General;  Laterality: Left;  . Peg tube placement      Family History  Problem Relation Age of Onset  . Diabetes Mother   . Hypertension Mother   . Arthritis Mother   . Heart disease Mother   . Heart failure Mother   . Other Mother     benign breast mass  . Stroke Father   . Heart disease Father   . Diabetes Paternal Grandmother   . Colon cancer Paternal Grandfather     Allergies  Allergen Reactions  . Olmesartan Medoxomil Cough    REACTION: ? if cough  . Tetracycline Hcl     Unknown reaction, too long for patient to remember   . Venlafaxine     REACTION: severe dry moouth  . Adhesive [Tape] Rash    Current Outpatient Prescriptions on File Prior to Visit  Medication Sig Dispense Refill  . clonazePAM (KLONOPIN) 0.5 MG tablet TAKE 1/2 TABLET TWICE  DAILY  60 tablet  0  . furosemide (LASIX) 20 MG tablet TAKE 1 TABLET BY MOUTH EVERY DAY  30 tablet  3  . levothyroxine (SYNTHROID, LEVOTHROID) 150 MCG tablet TAKE 1 TABLET EVERY DAY  30 tablet  3  . metoprolol tartrate (LOPRESSOR) 25 MG tablet TAKE 1 TABLET BY MOUTH TWICE DAILY  60 tablet  3  . potassium chloride (K-DUR) 10 MEQ tablet TAKE 1 TABLET TWICE DAILY  60 tablet  3  . traMADol (ULTRAM) 50 MG tablet Take 1 tablet (50 mg total) by mouth every 6 (six) hours as needed.  60 tablet  3   No current facility-administered medications on file prior to visit.    BP 135/62  Pulse 78  Temp(Src) 98.1 F (36.7 C) (Oral)  Resp 16  Ht 5\' 4"  (1.626 m)  Wt 209 lb 12.8 oz (95.165 kg)  BMI 35.99 kg/m2  SpO2 97%       Objective:   Physical Exam  Constitutional: She is oriented to person, place, and time. She appears well-developed and well-nourished. No distress.  HENT:  Head: Normocephalic and atraumatic.  Cardiovascular: Normal rate and regular rhythm.   No murmur heard. Pulmonary/Chest: Effort normal and breath sounds normal. No respiratory distress. She has no wheezes. She has no rales. She exhibits no tenderness.  Musculoskeletal: She exhibits no edema.  Neurological: She is alert and oriented to person, place, and time.  Skin: Skin is warm and dry.  Psychiatric: She has a normal mood and affect. Her behavior is normal. Judgment and thought content normal.          Assessment & Plan:

## 2014-01-24 NOTE — Assessment & Plan Note (Signed)
Obtain follow up A1C.   

## 2014-01-24 NOTE — Assessment & Plan Note (Signed)
Stable on zyprexa, continue same.

## 2014-01-24 NOTE — Progress Notes (Signed)
Pre visit review using our clinic review tool, if applicable. No additional management support is needed unless otherwise documented below in the visit note. 

## 2014-01-24 NOTE — Assessment & Plan Note (Signed)
Stable on synthroid, continue current dose.

## 2014-01-26 ENCOUNTER — Encounter: Payer: Self-pay | Admitting: Family

## 2014-01-31 ENCOUNTER — Other Ambulatory Visit: Payer: Self-pay | Admitting: Family

## 2014-02-08 ENCOUNTER — Ambulatory Visit (INDEPENDENT_AMBULATORY_CARE_PROVIDER_SITE_OTHER): Payer: No Typology Code available for payment source | Admitting: *Deleted

## 2014-02-08 ENCOUNTER — Other Ambulatory Visit: Payer: Self-pay | Admitting: Family

## 2014-02-08 DIAGNOSIS — Z86718 Personal history of other venous thrombosis and embolism: Secondary | ICD-10-CM

## 2014-02-08 DIAGNOSIS — I4891 Unspecified atrial fibrillation: Secondary | ICD-10-CM

## 2014-02-08 LAB — POCT INR: INR: 2.3

## 2014-02-08 NOTE — Telephone Encounter (Signed)
Lexapro is not on current med list. Please advise re: request from pharmacy?

## 2014-02-14 ENCOUNTER — Other Ambulatory Visit: Payer: Self-pay | Admitting: Family

## 2014-02-14 ENCOUNTER — Other Ambulatory Visit: Payer: Self-pay | Admitting: Cardiovascular Disease

## 2014-02-15 NOTE — Telephone Encounter (Signed)
Drug-Drug Interaction Report: Spironolactone & Potassium Chloride Potassium Preparations / Potassium-Sparing Diuretics Significance: Major Warning: Co-administration of potassium chloride and spironolactone may cause hyperkalemia, possibly leading to cardiac arrhythmias or cardiac arrest. Onset: Delayed Document Level: Established  Please advise on refills for Spironolactone/SLS

## 2014-02-22 ENCOUNTER — Ambulatory Visit (INDEPENDENT_AMBULATORY_CARE_PROVIDER_SITE_OTHER): Payer: No Typology Code available for payment source | Admitting: *Deleted

## 2014-02-22 DIAGNOSIS — I4891 Unspecified atrial fibrillation: Secondary | ICD-10-CM

## 2014-02-22 DIAGNOSIS — Z86718 Personal history of other venous thrombosis and embolism: Secondary | ICD-10-CM

## 2014-02-22 LAB — POCT INR: INR: 2.9

## 2014-03-13 ENCOUNTER — Encounter: Payer: Self-pay | Admitting: Family

## 2014-03-16 ENCOUNTER — Other Ambulatory Visit: Payer: Self-pay | Admitting: Family

## 2014-03-17 ENCOUNTER — Other Ambulatory Visit: Payer: Self-pay | Admitting: Family

## 2014-03-17 NOTE — Telephone Encounter (Signed)
Pt has f/u 04/25/14.  Please advise re: refill below;  Medication name:  Name from pharmacy:  clonazePAM (KLONOPIN) 0.5 MG tablet CLONAZEPAM 0.5 MG TABLET     Sig: TAKE 1/2 TABLET TWICE DAILY    Dispense: 60 tablet   Start: 03/17/2014   Class: Normal    Requested on: 01/13/2014    Originally ordered on: 01/13/2014 02/14/2014

## 2014-03-17 NOTE — Telephone Encounter (Signed)
Rx request faxed to pharmacy/[facility]  

## 2014-03-17 NOTE — Telephone Encounter (Signed)
Ok to send 60 tabs zero refills. 

## 2014-03-22 ENCOUNTER — Ambulatory Visit (INDEPENDENT_AMBULATORY_CARE_PROVIDER_SITE_OTHER): Payer: No Typology Code available for payment source | Admitting: *Deleted

## 2014-03-22 DIAGNOSIS — I4891 Unspecified atrial fibrillation: Secondary | ICD-10-CM

## 2014-03-22 DIAGNOSIS — Z86718 Personal history of other venous thrombosis and embolism: Secondary | ICD-10-CM

## 2014-03-22 LAB — POCT INR: INR: 2.3

## 2014-03-31 ENCOUNTER — Other Ambulatory Visit: Payer: Self-pay | Admitting: *Deleted

## 2014-03-31 DIAGNOSIS — C50919 Malignant neoplasm of unspecified site of unspecified female breast: Secondary | ICD-10-CM

## 2014-04-03 ENCOUNTER — Ambulatory Visit (HOSPITAL_BASED_OUTPATIENT_CLINIC_OR_DEPARTMENT_OTHER): Payer: No Typology Code available for payment source | Admitting: Oncology

## 2014-04-03 ENCOUNTER — Other Ambulatory Visit (HOSPITAL_BASED_OUTPATIENT_CLINIC_OR_DEPARTMENT_OTHER): Payer: No Typology Code available for payment source

## 2014-04-03 ENCOUNTER — Telehealth: Payer: Self-pay | Admitting: Oncology

## 2014-04-03 VITALS — BP 137/60 | HR 80 | Temp 97.8°F | Resp 18 | Ht 64.0 in | Wt 207.8 lb

## 2014-04-03 DIAGNOSIS — C50111 Malignant neoplasm of central portion of right female breast: Secondary | ICD-10-CM

## 2014-04-03 DIAGNOSIS — C50919 Malignant neoplasm of unspecified site of unspecified female breast: Secondary | ICD-10-CM

## 2014-04-03 DIAGNOSIS — Z170 Estrogen receptor positive status [ER+]: Secondary | ICD-10-CM

## 2014-04-03 DIAGNOSIS — C50811 Malignant neoplasm of overlapping sites of right female breast: Secondary | ICD-10-CM | POA: Insufficient documentation

## 2014-04-03 DIAGNOSIS — C773 Secondary and unspecified malignant neoplasm of axilla and upper limb lymph nodes: Secondary | ICD-10-CM

## 2014-04-03 DIAGNOSIS — C50911 Malignant neoplasm of unspecified site of right female breast: Secondary | ICD-10-CM

## 2014-04-03 DIAGNOSIS — M8580 Other specified disorders of bone density and structure, unspecified site: Secondary | ICD-10-CM

## 2014-04-03 LAB — COMPREHENSIVE METABOLIC PANEL (CC13)
ALT: 46 U/L (ref 0–55)
AST: 71 U/L — AB (ref 5–34)
Albumin: 3.3 g/dL — ABNORMAL LOW (ref 3.5–5.0)
Alkaline Phosphatase: 140 U/L (ref 40–150)
Anion Gap: 12 mEq/L — ABNORMAL HIGH (ref 3–11)
BUN: 6.5 mg/dL — ABNORMAL LOW (ref 7.0–26.0)
CHLORIDE: 108 meq/L (ref 98–109)
CO2: 21 mEq/L — ABNORMAL LOW (ref 22–29)
Calcium: 9.2 mg/dL (ref 8.4–10.4)
Creatinine: 0.7 mg/dL (ref 0.6–1.1)
Glucose: 141 mg/dl — ABNORMAL HIGH (ref 70–140)
Potassium: 3.9 mEq/L (ref 3.5–5.1)
Sodium: 141 mEq/L (ref 136–145)
Total Bilirubin: 1.01 mg/dL (ref 0.20–1.20)
Total Protein: 6.8 g/dL (ref 6.4–8.3)

## 2014-04-03 LAB — CBC WITH DIFFERENTIAL/PLATELET
BASO%: 0.2 % (ref 0.0–2.0)
Basophils Absolute: 0 10*3/uL (ref 0.0–0.1)
EOS%: 2.2 % (ref 0.0–7.0)
Eosinophils Absolute: 0.1 10*3/uL (ref 0.0–0.5)
HCT: 39.6 % (ref 34.8–46.6)
HGB: 13.1 g/dL (ref 11.6–15.9)
LYMPH#: 1 10*3/uL (ref 0.9–3.3)
LYMPH%: 21.8 % (ref 14.0–49.7)
MCH: 30.5 pg (ref 25.1–34.0)
MCHC: 33.1 g/dL (ref 31.5–36.0)
MCV: 92.3 fL (ref 79.5–101.0)
MONO#: 0.3 10*3/uL (ref 0.1–0.9)
MONO%: 6.8 % (ref 0.0–14.0)
NEUT#: 3.1 10*3/uL (ref 1.5–6.5)
NEUT%: 69 % (ref 38.4–76.8)
Platelets: 112 10*3/uL — ABNORMAL LOW (ref 145–400)
RBC: 4.29 10*6/uL (ref 3.70–5.45)
RDW: 14.5 % (ref 11.2–14.5)
WBC: 4.6 10*3/uL (ref 3.9–10.3)

## 2014-04-03 NOTE — Telephone Encounter (Signed)
per pof to sch pt appt-sch pt appt w/Dr Cristino Martes pt time date location-Adv pt of CXR in April recoreded on sch-pt understood

## 2014-04-03 NOTE — Progress Notes (Signed)
ID: [name]   DOB: 09-04-1955  MR#: [id number]  HYQ#:[id number]  PCP: [name], NP GYN:  SU: [facility] OTHER XB:MWUXLK [name], Shanon Ace, Roney Jaffe  CHIEF COMPLAINT:  Right Breast Cancer CURRENT TREATMENT: Letrozole   HISTORY OF PRESENT ILLNESS: From the original consult note:  [name] noted a mass in her right breast mid December, and as it did not spontaneously resolve over a couple of weeks she brought it to her primary physician's attention. She was set up for diagnostic mammography and right breast ultrasonography at the breast Center 05/10/2012. (Note that the patient's most recent prior mammography had been in October 2008). The current study showed a spiculated mass in the superior subareolar portion of the right breast measuring approximately 5 cm and associated with pleomorphic calcifications. This was firm and palpable. There was right nipple retraction and skin thickening. Ultrasound confirmed an irregularly marginated hypoechoic mass measuring 3.5 cm by ultrasound, and an abnormal appearing lower right axillary lymph node measuring 2.6 cm.  Biopsies of both the breast mass and the abnormal appearing lymph node were performed 05/21/2012. Both showed an invasive ductal carcinoma, grade 2, with similar prognostic panels (the breast mass was 100% estrogen and 73% progesterone receptor positive, with an MIB-1 of 5%; the lymph node was 100% estrogen 100% progesterone receptor positive, with an MIB-1 of 20%). Both masses were HER-2 negative.  Breast MRI obtained at [facility] imaging 05/29/2012 confirmed a dominant mass in the retroareolar right breast measuring 4.4 cm maximally. There was a satellite nodule inferior and lateral to this mass, measuring 1.7 cm. There were no other masses in either breast. Aside from the previously biopsied lymph node there were other mildly enhancing level I right axillary lymph nodes which did not appear  pathologic. There was no other lymphadenopathy noted.   The patient's subsequent history is as detailed below.   INTERVAL HISTORY: [name] returns today for followup of her right breast cancer. From a breast cancer point of view she is very stable. She is tolerating letrozole with no significant side effects that she is aware of. Her daughter, her daughter's husband and their 2 children, 54 and 10 years old, are still in their home while they "get on their feet". Their son-in-law just got a job as a Administrator. This is a little difficult on [name] and [name]. [name] of courses still disabled from his 20 foot fall, with significant pain in his feet and back. He walks with a cane. He just began to drive.  REVIEW OF SYSTEMS: [name] denies unusual headaches, visual changes (other than the fact that she needs to wear glasses now), nausea, vomiting, dizziness, or gait imbalance. She has a dry cough, rarely productive of clear phlegm. She is still limited in what she can do. She Rhonda walk from her car to [place] but then uses the "boggy" in the store. He has been no change in bowel or bladder habits. She continues to have a rash over her upper arms bilaterally, which is not itchy and has not changed since she was in the hospital. She does complain of anxiety and depression and feels somewhat overwhelmed by her home situation. A detailed review of systems today was otherwise noncontributory  PAST MEDICAL HISTORY: Past Medical History  Diagnosis Date  . Depression   . DVT (deep venous thrombosis)     hx of on HRT left leg ~2006  . GERD (gastroesophageal reflux disease)   . Hyperlipidemia   . Hypertension   .  Hypothyroidism   . PPD positive, treated     rx inh   . OSA on CPAP   . Diabetes mellitus   . 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 Breast cancer     a. 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 DJD (degenerative joint disease) of lumbar spine   . Chronic diastolic CHF (congestive heart failure)     a. 08/2012 Echo: EF 55-60%, no rwma, mod MR.  [name] A-fib     a. on amio for rate control/coumadin.  [name] Physical deconditioning   . Allergy   . Anemia     low iron hx  . Blood transfusion without reported diagnosis     2 u prbc  . Cataract     removed ou    PAST SURGICAL HISTORY: Past Surgical History  Procedure Laterality Date  . Tubal ligation    . Cholecystectomy    . Foot surgery    . Eye surgery    . Cataract extraction    . Percutaneous liver biopsy    . Breast biopsy      left breast  . Mastectomy modified radical  06/08/2012    Procedure: MASTECTOMY MODIFIED RADICAL;  Surgeon: Edward Jolly, MD;  Location: [place];  Service: General;  Laterality: Right;  . Portacath placement  06/08/2012    Procedure: INSERTION PORT-A-CATH;  Surgeon: Edward Jolly, MD;  Location: [facility];  Service: General;  Laterality: Left;  . Peg tube placement      FAMILY HISTORY Family History  Problem Relation Age of Onset  . Diabetes Mother   . Hypertension Mother   . Arthritis Mother   . Heart disease Mother   . Heart failure Mother   . Other Mother     benign breast mass  . Stroke Father   . Heart disease Father   . Diabetes Paternal Grandmother   . Colon cancer Paternal Grandfather    the patient's father died in his 43s with a history of dementia. He had had prior strokes. The patient's mother died in her 30s, with a history of congestive heart failure. [name] had no brothers, one sister. There is no history of breast or ovarian cancer in the family.  GYNECOLOGIC HISTORY: Menarche age 67, first live birth age 11, she is GX P2, menopause approximately 15 years ago, on hormone replacement until 2010.  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 Her husband [name]  works for [employer]. Daughter [name] is a Physiological scientist and lives in [place]. Daughter [name] and her family (husband and 2 children aged 71 and 3 years) currently live with the patient. [name] is a member of a [employer].   ADVANCED DIRECTIVES: Not in place  HEALTH MAINTENANCE: (Updated October 2014) History  Substance Use Topics  . Smoking status: Former Smoker -- 1.00 packs/day for 1 years  . Smokeless tobacco: Never Used  . Alcohol Use: No     Colonoscopy: Never  PAP: Does not recall  Bone density: Never  Lipid panel:   Allergies  Allergen Reactions  . Olmesartan Medoxomil Cough    REACTION: ? if cough  . Tetracycline Hcl     Unknown reaction, too long for patient to remember   .  Venlafaxine     REACTION: severe dry moouth  . Adhesive [Tape] Rash    Current Outpatient Prescriptions  Medication Sig Dispense Refill  . clonazePAM (KLONOPIN) 0.5 MG tablet TAKE 1/2 TABLET TWICE DAILY 60 tablet 0  . escitalopram (LEXAPRO) 20 MG tablet TAKE 1 TABLET EVERY DAY 30 tablet 3  . furosemide (LASIX) 20 MG tablet TAKE 1 TABLET BY MOUTH EVERY DAY 30 tablet 3  . glimepiride (AMARYL) 2 MG tablet TAKE 1 TABLET EVERY DAY BEFORE BREAKFAST 30 tablet 1  . levothyroxine (SYNTHROID, LEVOTHROID) 150 MCG tablet TAKE 1 TABLET EVERY DAY 30 tablet 3  . metFORMIN (GLUCOPHAGE) 1000 MG tablet TAKE 1 TABLET TWICE DAILY WITH A MEAL 60 tablet 0  . metoprolol tartrate (LOPRESSOR) 25 MG tablet TAKE 1 TABLET BY MOUTH TWICE DAILY 60 tablet 3  . OLANZapine (ZYPREXA) 2.5 MG tablet TAKE 1 TABLET NIGHTLY AT BEDTIME AS NEEDED FOR SLEEP OR anxiety 30 tablet 3  . potassium chloride (K-DUR) 10 MEQ tablet TAKE 1 TABLET TWICE DAILY 60 tablet 3  . spironolactone (ALDACTONE) 25 MG tablet TAKE 1 TABLET EVERY DAY 30 tablet 2  . traMADol (ULTRAM) 50 MG tablet Take 1 tablet (50 mg total) by mouth every 6 (six) hours as needed. 60 tablet 3  . warfarin (COUMADIN) 3 MG tablet  Take 2 tablets every day each week except Monday, only take 1 tablet.    . warfarin (COUMADIN) 3 MG tablet Take as directed by Anticoagulation clinic 90 tablet 1   No current facility-administered medications for this visit.    OBJECTIVE: Middle-aged white woman who appears stated age 58 Vitals:   04/03/14 0928  BP: 137/60  Pulse: 80  Temp: 97.8 F (36.6 C)  Resp: 18     Body mass index is 35.65 kg/(m^2).    ECOG FS: 1 Filed Weights   04/03/14 0928  Weight: 207 lb 12.8 oz (94.257 kg)   Sclerae unicteric, pupils round and equal, EOMs intact Oropharynx clear, no thrush or other lesions No cervical or supraclavicular adenopathy Lungs no rales or rhonchi Heart regular rate and rhythm Abd soft, obese, nontender, positive bowel sounds MSK no focal spinal tenderness, no upper extremity lymphedema Neuro: nonfocal, well oriented, positive affect Breasts: The right breast is status post mastectomy. There is no evidence of chest wall recurrence. The right axilla is benign. The left breast is unremarkable. Skin: She has a lesion in the lower right abdomen, close to the midline, which appears to be a black head, but which is blue under the skin. This may warrant dermatologic excision. Also, she has a nonpalpable erythematous lacy rash over both upper forearms. This has been present she says since the time of her hospitalization a year ago.   LAB RESULTS: Lab Results  Component Value Date   WBC 4.6 04/03/2014   NEUTROABS 3.1 04/03/2014   HGB 13.1 04/03/2014   HCT 39.6 04/03/2014   MCV 92.3 04/03/2014   PLT 112* 04/03/2014      Chemistry      Component Value Date/Time   NA 143 12/27/2013 1322   NA 137 11/10/2013 0828   K 3.9 12/27/2013 1322   K 4.0 11/10/2013 0828   CL 102 11/10/2013 0828   CL 104 10/29/2012 1058   CO2 23 12/27/2013 1322   CO2 25 11/10/2013 0828   BUN 6.9* 12/27/2013 1322   BUN 7 11/10/2013 0828   CREATININE 0.7 12/27/2013 1322   CREATININE 0.69 11/10/2013  0828   CREATININE 0.6 10/15/2012  1151      Component Value Date/Time   CALCIUM 9.4 12/27/2013 1322   CALCIUM 8.9 11/10/2013 0828   ALKPHOS 145 12/27/2013 1322   ALKPHOS 134* 07/25/2013 0859   AST 85* 12/27/2013 1322   AST 58* 07/25/2013 0859   ALT 51 12/27/2013 1322   ALT 40* 07/25/2013 0859   BILITOT 0.69 12/27/2013 1322   BILITOT 0.8 07/25/2013 0859     Results for YARELIS, AMBROSINO (MRN [id number]) as of 04/03/2014 10:18  Ref. Range 12/30/2012 10:04 03/11/2013 09:07 07/25/2013 08:59 12/27/2013 13:22 04/03/2014 08:51  AST Latest Range: 0-37 U/L 40 (H) 34 58 (H) 85 (H) 71 (H)    STUDIES: CLINICAL DATA: Low back pain, increasing. History of breast cancer.  EXAM: LUMBAR SPINE - 2-3 VIEW  COMPARISON: Multiple exams, including 08/27/2012  FINDINGS: Severe asymmetric arthropathy left hip, as shown on prior exams.  Calcified uterine fibroid.  Loss of disc height at L5-S1 with associated spurring. This appears to been present on prior exams such as 07/05/2012. No lumbar malalignment or if compression fracture identified. Mild bilateral facet arthropathy at L5-S1.  Aortoiliac atherosclerotic vascular disease. Bilateral tubal ligation clips noted.  IMPRESSION: 1. Lumbar spondylosis and degenerative disc disease at L5-S1. 2. Aortoiliac atherosclerotic vascular disease. 3. Calcified uterine fibroid. 4. Chronic arthropathy left hip.  ASSESSMENT: 58 y.o. [name] woman   (1)  status post right mastectomy under the care of Dr. Excell Seltzer on 06/08/2012 for a pT3, pN1a, stage IIIA invasive ductal carcinoma, grade 2,  estrogen and progesterone receptor positive at 100%, HER-2/neu negative, with MIB-1 of 20%.    (2)  treated in the adjuvant setting with docetaxel/cyclophosphamide given every 3 weeks. There were multiple and severe complications, and the  patient tolerated only two cycles, last dose 07/29/2012  (3) letrozole started 08/16/2012  (a) osteopenia, with a T score  of -1.14 April 2013  (4) postmastectomy radiation completed 12/24/2012  (5)  comorbidities include diabetes, hypertension, chronic liver disease with cirrhosis and fatty liver, and nutritional disturbance with temporarily dependence on PEG feeds (discontinued July 2014).   PLAN: Rhen is very stable, with no clinical evidence of progressive or recurrent breast cancer. We're going to continue the letrozole, and she Rhonda need a repeat bone density D7 birth 2016.  She is scheduled for mammography next month. I'm going to obtain a chest x-ray before her next visit here, together with lab work.  I think the lesion in the lower right abdomen is probably benign, but likely should be excised just to make sure, as the subcutaneous portion of it looks blue.  She knows to call for any problems that may develop before that visit.  Chauncey Cruel, MD      04/03/2014

## 2014-04-03 NOTE — Addendum Note (Signed)
Addended by: Laureen Abrahams on: 04/03/2014 06:51 PM   Modules accepted: Medications

## 2014-04-17 ENCOUNTER — Other Ambulatory Visit: Payer: Self-pay

## 2014-04-17 DIAGNOSIS — Z1231 Encounter for screening mammogram for malignant neoplasm of breast: Secondary | ICD-10-CM

## 2014-04-17 DIAGNOSIS — Z9011 Acquired absence of right breast and nipple: Secondary | ICD-10-CM

## 2014-04-25 ENCOUNTER — Ambulatory Visit: Payer: Self-pay | Admitting: Family

## 2014-04-25 ENCOUNTER — Ambulatory Visit (INDEPENDENT_AMBULATORY_CARE_PROVIDER_SITE_OTHER): Payer: No Typology Code available for payment source | Admitting: *Deleted

## 2014-04-25 DIAGNOSIS — Z86718 Personal history of other venous thrombosis and embolism: Secondary | ICD-10-CM

## 2014-04-25 DIAGNOSIS — I4891 Unspecified atrial fibrillation: Secondary | ICD-10-CM

## 2014-04-25 LAB — POCT INR: INR: 3.1

## 2014-04-25 MED ORDER — WARFARIN SODIUM 3 MG PO TABS: ORAL_TABLET | ORAL | Status: DC

## 2014-04-25 NOTE — Addendum Note (Signed)
Addended by: Margretta Sidle on: 04/25/2014 09:35 AM   Modules accepted: Orders

## 2014-04-26 ENCOUNTER — Encounter: Payer: Self-pay | Admitting: Medical

## 2014-04-26 ENCOUNTER — Telehealth: Payer: Self-pay

## 2014-04-26 ENCOUNTER — Ambulatory Visit (INDEPENDENT_AMBULATORY_CARE_PROVIDER_SITE_OTHER): Payer: No Typology Code available for payment source | Admitting: Medical

## 2014-04-26 ENCOUNTER — Ambulatory Visit (HOSPITAL_BASED_OUTPATIENT_CLINIC_OR_DEPARTMENT_OTHER)
Admission: RE | Admit: 2014-04-26 | Discharge: 2014-04-26 | Disposition: A | Discharge status: Home / Self Care | Payer: No Typology Code available for payment source | Source: Ambulatory Visit | Attending: Oncology | Admitting: Oncology

## 2014-04-26 VITALS — BP 121/75 | HR 72 | Temp 98.2°F | Ht 64.0 in | Wt 203.2 lb

## 2014-04-26 DIAGNOSIS — R059 Cough, unspecified: Secondary | ICD-10-CM | POA: Insufficient documentation

## 2014-04-26 DIAGNOSIS — R05 Cough: Secondary | ICD-10-CM | POA: Insufficient documentation

## 2014-04-26 DIAGNOSIS — C50911 Malignant neoplasm of unspecified site of right female breast: Secondary | ICD-10-CM

## 2014-04-26 DIAGNOSIS — I4891 Unspecified atrial fibrillation: Secondary | ICD-10-CM | POA: Diagnosis not present

## 2014-04-26 DIAGNOSIS — J189 Pneumonia, unspecified organism: Secondary | ICD-10-CM | POA: Insufficient documentation

## 2014-04-26 DIAGNOSIS — Z853 Personal history of malignant neoplasm of breast: Secondary | ICD-10-CM | POA: Insufficient documentation

## 2014-04-26 DIAGNOSIS — Z8701 Personal history of pneumonia (recurrent): Secondary | ICD-10-CM

## 2014-04-26 DIAGNOSIS — E119 Type 2 diabetes mellitus without complications: Secondary | ICD-10-CM

## 2014-04-26 DIAGNOSIS — Z9011 Acquired absence of right breast and nipple: Secondary | ICD-10-CM | POA: Diagnosis not present

## 2014-04-26 DIAGNOSIS — K219 Gastro-esophageal reflux disease without esophagitis: Secondary | ICD-10-CM

## 2014-04-26 LAB — COMPREHENSIVE METABOLIC PANEL
ALBUMIN: 3.5 g/dL (ref 3.5–5.2)
ALK PHOS: 111 U/L (ref 39–117)
ALT: 43 U/L — ABNORMAL HIGH (ref 0–35)
AST: 79 U/L — AB (ref 0–37)
BUN: 6 mg/dL (ref 6–23)
CALCIUM: 9.2 mg/dL (ref 8.4–10.5)
CHLORIDE: 107 meq/L (ref 96–112)
CO2: 21 mEq/L (ref 19–32)
Creatinine, Ser: 0.7 mg/dL (ref 0.4–1.2)
GFR: 87.77 mL/min (ref >60.00)
GLUCOSE: 58 mg/dL — AB (ref 70–99)
POTASSIUM: 4 meq/L (ref 3.5–5.1)
Sodium: 139 mEq/L (ref 135–145)
Total Bilirubin: 1.1 mg/dL (ref 0.2–1.2)
Total Protein: 7.5 g/dL (ref 6.0–8.3)

## 2014-04-26 LAB — HEMOGLOBIN A1C: Hgb A1c MFr Bld: 5.6 % (ref 4.6–6.5)

## 2014-04-26 MED ORDER — ALBUTEROL SULFATE HFA 108 (90 BASE) MCG/ACT IN AERS: 2.0000 | INHALATION_SPRAY | Freq: Four times a day (QID) | RESPIRATORY_TRACT | Status: DC | PRN

## 2014-04-26 MED ORDER — OMEPRAZOLE 20 MG PO CPDR: DELAYED_RELEASE_CAPSULE | ORAL | Status: DC

## 2014-04-26 MED ORDER — HYDROCODONE-HOMATROPINE 5-1.5 MG/5ML PO SYRP: 5.0000 mL | ORAL_SOLUTION | Freq: Three times a day (TID) | ORAL | Status: DC | PRN

## 2014-04-26 MED ORDER — OMEPRAZOLE 20 MG PO CPDR: 20.0000 mg | DELAYED_RELEASE_CAPSULE | Freq: Every day | ORAL | Status: DC

## 2014-04-26 NOTE — Assessment & Plan Note (Signed)
You have peristent moderate to severe cough after hospitalization for pneumonia. I don't have your hospital records. Since you are somewhat still symptomatic, I do want to get cxr today and also get cbc. I am prescribing hydromet and please stop robitussin with codeine that [facility] prescribed.  We will notify you of test results. If you symptoms worsen or change please notify us.

## 2014-04-26 NOTE — Progress Notes (Signed)
Subjective:    Patient ID: [name], female    DOB: 25-Oct-1955, 58 y.o.   MRN: [id number]  HPI   Pt in for follow up pneumonia from hospital [facility].  She updates me that went to [facility] and admitted her for pneumonia(2 nights). She got out on Sunday.They discharged her on 2 tabs of levofloxin. She did take those pills. No fever, no chills. Pt still has cough. Pt cough is keeping her up at night. Pt is not wheezing. Then states maybe. No history of smoking. No shortness of breath.  Pt also reports that she feels fatigued and not coughing.  I checked care everywhere and no records since was admitted to [facility].  Pt had INR checked yesterday.  Past Medical History  Diagnosis Date  . Depression   . DVT (deep venous thrombosis)     hx of on HRT left leg ~2006  . GERD (gastroesophageal reflux disease)   . Hyperlipidemia   . Hypertension   . Hypothyroidism   . PPD positive, treated     rx inh   . OSA on CPAP   . Diabetes mellitus   . 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 Breast cancer     a. 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 DJD (degenerative joint disease) of lumbar spine   . Chronic diastolic CHF (congestive heart failure)     a. 08/2012 Echo: EF 55-60%, no rwma, mod MR.  [name] A-fib     a. on amio for rate control/coumadin.  [name] Physical deconditioning   . Allergy   . Anemia     low iron hx  . Blood transfusion without reported diagnosis     2 u prbc  . Cataract     removed ou    History   Social History  . Marital Status: Married    Spouse Name: N/A    Number of Children: 2  . Years of Education: N/A   Occupational History  . [place] History Main Topics  . Smoking status: Former Smoker -- 1.00 packs/day for 1 years  . Smokeless tobacco: Never Used  . Alcohol Use: No  .  Drug Use: No     Comment: quit age 56 only smoked as a teen 1 year  . Sexual Activity: No   Other Topics Concern  . Not on file   Social History Narrative   Married   Works at [employer] secretary   Daily caffeine use - 2 cups a day plus a couple of sodas a day   Pt doesn't exercise regularly   G2P2   H H of 5 soon to be 6 .     Past Surgical History  Procedure Laterality Date  . Tubal ligation    . Cholecystectomy    . Foot surgery    . Eye surgery    . Cataract extraction    . Percutaneous liver biopsy    . Breast biopsy      left breast  . Mastectomy modified radical  06/08/2012    Procedure: MASTECTOMY MODIFIED RADICAL;  Surgeon:  Jolly, MD;  Location: [place];  Service: General;  Laterality: Right;  . Portacath placement  06/08/2012    Procedure: INSERTION PORT-A-CATH;  Surgeon:  Jolly, MD;  Location: [place];  Service: General;  Laterality: Left;  . Peg tube placement      Family History  Problem Relation Age of Onset  . Diabetes Mother   . Hypertension Mother   . Arthritis Mother   . Heart disease Mother   . Heart failure Mother   . Other Mother     benign breast mass  . Stroke Father   . Heart disease Father   . Diabetes Paternal Grandmother   . Colon cancer Paternal Grandfather     Allergies  Allergen Reactions  . Olmesartan Medoxomil Cough    REACTION: ? if cough  . Tetracycline Hcl     Unknown reaction, too long for patient to remember   . Venlafaxine     REACTION: severe dry moouth  . Adhesive [Tape] Rash    Current Outpatient Prescriptions on File Prior to Visit  Medication Sig Dispense Refill  . buPROPion (WELLBUTRIN) 75 MG tablet Take 75 mg by mouth 2 (two) times daily.  3  . clonazePAM (KLONOPIN) 0.5 MG tablet TAKE 1/2 TABLET TWICE DAILY 60 tablet 0  . escitalopram (LEXAPRO) 20 MG tablet TAKE 1 TABLET EVERY DAY 30 tablet 3  . furosemide (LASIX) 20 MG tablet TAKE 1 TABLET BY MOUTH EVERY DAY 30 tablet 3  .  glimepiride (AMARYL) 2 MG tablet TAKE 1 TABLET EVERY DAY BEFORE BREAKFAST 30 tablet 1  . letrozole (FEMARA) 2.5 MG tablet Take 2.5 mg by mouth daily.  12  . levothyroxine (SYNTHROID, LEVOTHROID) 150 MCG tablet TAKE 1 TABLET EVERY DAY 30 tablet 3  . metFORMIN (GLUCOPHAGE) 1000 MG tablet TAKE 1 TABLET TWICE DAILY WITH A MEAL 60 tablet 0  . metoprolol tartrate (LOPRESSOR) 25 MG tablet TAKE 1 TABLET BY MOUTH TWICE DAILY 60 tablet 3  . OLANZapine (ZYPREXA) 2.5 MG tablet TAKE 1 TABLET NIGHTLY AT BEDTIME AS NEEDED FOR SLEEP OR anxiety 30 tablet 3  . potassium chloride (K-DUR) 10 MEQ tablet TAKE 1 TABLET TWICE DAILY 60 tablet 3  . spironolactone (ALDACTONE) 25 MG tablet TAKE 1 TABLET EVERY DAY 30 tablet 2  . warfarin (COUMADIN) 3 MG tablet Take as directed by coumadin clinic 50 tablet 1   No current facility-administered medications on file prior to visit.    BP 121/75 mmHg  Pulse 72  Temp(Src) 98.2 F (36.8 C) (Oral)  Ht 5\' 4"  (1.626 m)  Wt 203 lb 3.2 oz (92.171 kg)  BMI 34.86 kg/m2  SpO2 95%         Review of Systems  Constitutional: Positive for fatigue. Negative for fever and chills.  HENT: Negative for congestion, ear discharge, hearing loss, postnasal drip, sinus pressure, sore throat, tinnitus and trouble swallowing.   Respiratory: Positive for cough. Negative for chest tightness, shortness of breath and wheezing.        States maybe wheezing she is not sure.  Cardiovascular: Negative for chest pain and palpitations.  Musculoskeletal: Negative for back pain.  Hematological: Negative for adenopathy. Does not bruise/bleed easily.       Objective:   Physical Exam   General  Mental Status - Alert. General Appearance - Well groomed. Not in acute distress.  Skin Rashes- No Rashes.  HEENT Head- Normal. Ear Auditory Canal - Left- Normal. Right - Normal.Tympanic Membrane- Left- Normal. Right- Normal. Eye Sclera/Conjunctiva- Left- Normal. Right- Normal. Nose & Sinuses  Nasal Mucosa- Left-  Not boggy or Congested. Right-  Not  boggy or Congested. Mouth & Throat Lips: Upper Lip- Normal: no dryness, cracking, pallor, cyanosis,  or vesicular eruption. Lower Lip-Normal: no dryness, cracking, pallor, cyanosis or vesicular eruption. Buccal Mucosa- Bilateral- No Aphthous ulcers. Oropharynx- No Discharge or Erythema. Tonsils: Characteristics- Bilateral- No Erythema or Congestion. Size/Enlargement- Bilateral- No enlargement. Discharge- bilateral-None.  Neck Neck- Supple. No Masses.   Chest and Lung Exam Auscultation: Breath Sounds:- clear, even and unlabored,  Cardiovascular Auscultation:Rythm- Regular, rate and rhythm. Murmurs & Other Heart Sounds:Ausculatation of the heart reveal- No Murmurs.  Lymphatic Head & Neck General Head & Neck Lymphatics: Bilateral: Description- No Localized lymphadenopathy.         Assessment & Plan:

## 2014-04-26 NOTE — Telephone Encounter (Signed)
Patient husband requesting that omeprazole be sent in. They are on their way to the pharmacy now.

## 2014-04-26 NOTE — Assessment & Plan Note (Signed)
Pt wanted to see Lenna Sciara today for hospital follow up. Since she is here and looks like due for a1-c will order that with cmp.

## 2014-04-26 NOTE — Patient Instructions (Addendum)
You have peristent moderate to severe cough after hospitalization for pneumonia. I don't have your hospital records. Since you are somewhat still symptomatic, I do want to get cxr today and also get cbc. I am prescribing hydromet and please stop robitussin with codeine that [facility] prescribed.  We will notify you of test results. If you symptoms worsen or change please notify us.  Follow up 7 days or as needed(any worsening respiratory symptoms. Looks like you are due for a complete check up on chronic problems soon. I am going to go ahead and order your cmp and a1-c today when cbc is drawn. Melissa NP may want you back in.  For your questionable wheezing I will prescribe/make albuterol inhaler available.

## 2014-04-26 NOTE — Telephone Encounter (Signed)
Notified pt that upon review of her chart, refill was sent to [facility] pharmacy at her visit today. She states that they are at the pharmacy now and no one has called it in.  Advised her that refill was sent electronically and she voices understanding.

## 2014-04-26 NOTE — Progress Notes (Signed)
Pre visit review using our clinic review tool, if applicable. No additional management support is needed unless otherwise documented below in the visit note. 

## 2014-04-26 NOTE — Telephone Encounter (Signed)
Patient states that if she has to come back up here that we would have to "call the cops on her". Patient and patient husband was very combative with me.

## 2014-04-27 ENCOUNTER — Other Ambulatory Visit (INDEPENDENT_AMBULATORY_CARE_PROVIDER_SITE_OTHER): Payer: No Typology Code available for payment source

## 2014-04-27 ENCOUNTER — Telehealth: Payer: Self-pay | Admitting: *Deleted

## 2014-04-27 DIAGNOSIS — Z9289 Personal history of other medical treatment: Secondary | ICD-10-CM

## 2014-04-27 LAB — CBC WITH DIFFERENTIAL/PLATELET
BASOS PCT: 0.4 % (ref 0.0–3.0)
Basophils Absolute: 0 10*3/uL (ref 0.0–0.1)
EOS PCT: 2.5 % (ref 0.0–5.0)
Eosinophils Absolute: 0.1 10*3/uL (ref 0.0–0.7)
HEMATOCRIT: 43.3 % (ref 36.0–46.0)
Hemoglobin: 14.2 g/dL (ref 12.0–15.0)
LYMPHS ABS: 1 10*3/uL (ref 0.7–4.0)
Lymphocytes Relative: 17.9 % (ref 12.0–46.0)
MCHC: 32.8 g/dL (ref 30.0–36.0)
MCV: 92 fl (ref 78.0–100.0)
MONO ABS: 0.3 10*3/uL (ref 0.1–1.0)
Monocytes Relative: 4.7 % (ref 3.0–12.0)
NEUTROS ABS: 4.2 10*3/uL (ref 1.4–7.7)
Neutrophils Relative %: 74.5 % (ref 43.0–77.0)
Platelets: 171 10*3/uL (ref 150.0–400.0)
RBC: 4.71 Mil/uL (ref 3.87–5.11)
RDW: 15 % (ref 11.5–15.5)
WBC: 5.7 10*3/uL (ref 4.0–10.5)

## 2014-04-27 NOTE — Telephone Encounter (Signed)
Patient dropped off [employer] form for [employer]. Forms filled out as much as possible and forwarded to [facility]. JG//CMA

## 2014-05-01 DIAGNOSIS — Z7689 Persons encountering health services in other specified circumstances: Secondary | ICD-10-CM

## 2014-05-03 ENCOUNTER — Ambulatory Visit: Payer: Self-pay | Admitting: Medical

## 2014-05-03 ENCOUNTER — Other Ambulatory Visit: Payer: Self-pay | Admitting: Family

## 2014-05-03 NOTE — Telephone Encounter (Signed)
Completed/signed forms faxed to [place] at [phone] successfully. JG//CMA

## 2014-05-08 ENCOUNTER — Ambulatory Visit (INDEPENDENT_AMBULATORY_CARE_PROVIDER_SITE_OTHER): Payer: No Typology Code available for payment source

## 2014-05-08 DIAGNOSIS — I4891 Unspecified atrial fibrillation: Secondary | ICD-10-CM

## 2014-05-08 DIAGNOSIS — Z86718 Personal history of other venous thrombosis and embolism: Secondary | ICD-10-CM

## 2014-05-08 LAB — POCT INR: INR: 3.1

## 2014-05-09 ENCOUNTER — Encounter: Payer: Self-pay | Admitting: Family

## 2014-05-09 NOTE — Progress Notes (Unsigned)
Received call from Team Nurse stating patient called in with complaint of BS. States it was 35 then after eating came up to 74. Recently hospitalized and was placed on ABT, States ABO has caused her to have diarrhea. States she took Probiotics in the past for this and it helped. Patient denies fever and pain. Patient states she is taking 3 pills for diabetes and feels that this is causing some of her problems as well. Patient has appointment with provider on 05/15/14. Asked if she wanted to be seen by another provider if she could be seen sooner,states she would rather se her provider. Advised to get probiotics OTC and eat a light diet. Told patient to go to UC if her symptoms worsened and to call us if she decided she would like to be seen before Monday and we could try to work her in before the close of the day on Thursday. Patient agreed.

## 2014-05-15 ENCOUNTER — Ambulatory Visit (INDEPENDENT_AMBULATORY_CARE_PROVIDER_SITE_OTHER): Payer: BLUE CROSS/BLUE SHIELD | Admitting: Family

## 2014-05-15 ENCOUNTER — Ambulatory Visit (HOSPITAL_BASED_OUTPATIENT_CLINIC_OR_DEPARTMENT_OTHER)
Admission: RE | Admit: 2014-05-15 | Discharge: 2014-05-15 | Disposition: A | Discharge status: Home / Self Care | Payer: BLUE CROSS/BLUE SHIELD | Source: Ambulatory Visit | Attending: Family | Admitting: Family

## 2014-05-15 ENCOUNTER — Encounter: Payer: Self-pay | Admitting: Family

## 2014-05-15 VITALS — BP 100/50 | HR 60 | Temp 97.8°F | Resp 18 | Ht 64.0 in | Wt 195.0 lb

## 2014-05-15 DIAGNOSIS — I2510 Atherosclerotic heart disease of native coronary artery without angina pectoris: Secondary | ICD-10-CM | POA: Diagnosis not present

## 2014-05-15 DIAGNOSIS — Z23 Encounter for immunization: Secondary | ICD-10-CM

## 2014-05-15 DIAGNOSIS — E119 Type 2 diabetes mellitus without complications: Secondary | ICD-10-CM

## 2014-05-15 DIAGNOSIS — J984 Other disorders of lung: Secondary | ICD-10-CM | POA: Insufficient documentation

## 2014-05-15 DIAGNOSIS — Z9011 Acquired absence of right breast and nipple: Secondary | ICD-10-CM | POA: Diagnosis not present

## 2014-05-15 DIAGNOSIS — R918 Other nonspecific abnormal finding of lung field: Secondary | ICD-10-CM | POA: Diagnosis not present

## 2014-05-15 DIAGNOSIS — J701 Chronic and other pulmonary manifestations due to radiation: Secondary | ICD-10-CM | POA: Insufficient documentation

## 2014-05-15 DIAGNOSIS — J189 Pneumonia, unspecified organism: Secondary | ICD-10-CM

## 2014-05-15 DIAGNOSIS — Z853 Personal history of malignant neoplasm of breast: Secondary | ICD-10-CM | POA: Insufficient documentation

## 2014-05-15 DIAGNOSIS — R161 Splenomegaly, not elsewhere classified: Secondary | ICD-10-CM | POA: Insufficient documentation

## 2014-05-15 DIAGNOSIS — Z923 Personal history of irradiation: Secondary | ICD-10-CM | POA: Insufficient documentation

## 2014-05-15 DIAGNOSIS — R932 Abnormal findings on diagnostic imaging of liver and biliary tract: Secondary | ICD-10-CM | POA: Insufficient documentation

## 2014-05-15 MED ORDER — METFORMIN HCL 500 MG PO TABS: 500.0000 mg | ORAL_TABLET | Freq: Two times a day (BID) | ORAL | Status: DC

## 2014-05-15 NOTE — Patient Instructions (Signed)
Stop glimepiride. Decrease metformin to 500mg  twice daily. You will be contacted about your CT chest. Follow up in 3 months.

## 2014-05-15 NOTE — Progress Notes (Signed)
Subjective:    Patient ID: [name], female    DOB: 1955/09/09, 59 y.o.   MRN: [id number]  HPI  [name] is a 59 yr old female who presents today for follow up.   Pneumonia- was admitted to [facility], discharged on levaquin.  Saw Mackie Pai in follow up on 12/16. She was given rx for hydromet. CXR that day noted mildly increased density in the RUL.  A CT scan was recommended. Pt reports that her cough is resolved.   Hypoglycemia- Last A1C was 5.6.  Reports that every night she has "hot flashes in her face and her hands shake. Usually occurs around 7PM. Sugar when she checks it during these episodes is around 50's.  She stopped the metformin >1 week ago.  She is taking the glimepiride still.  No further episodes.   Lab Results  Component Value Date   HGBA1C 5.6 04/26/2014     Review of Systems See HPI  Past Medical History  Diagnosis Date  . Depression   . DVT (deep venous thrombosis)     hx of on HRT left leg ~2006  . GERD (gastroesophageal reflux disease)   . Hyperlipidemia   . Hypertension   . Hypothyroidism   . PPD positive, treated     rx inh   . OSA on CPAP   . Diabetes mellitus   . 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 Breast cancer     a. 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 DJD (degenerative joint disease) of lumbar spine   . Chronic diastolic CHF (congestive heart failure)     a. 08/2012 Echo: EF 55-60%, no rwma, mod MR.  [name] A-fib     a. on amio for rate control/coumadin.  [name] Physical deconditioning   . Allergy   . Anemia     low iron hx  . Blood transfusion without reported diagnosis     2 u prbc  . Cataract     removed ou    History   Social History  . Marital Status: Married    Spouse Name: N/A    Number of Children: 2  . Years of Education: N/A   Occupational History  . [place] History Main Topics  . Smoking status: Former Smoker -- 1.00 packs/day for 1 years  . Smokeless tobacco: Never Used  . Alcohol Use: No  . Drug Use: No     Comment: quit age 28 only smoked as a teen 1 year  . Sexual Activity: No   Other Topics Concern  . 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     Past Surgical History  Procedure Laterality Date  . Tubal ligation    . Cholecystectomy    . Foot surgery    . Eye surgery    . Cataract extraction    . Percutaneous liver biopsy    . Breast biopsy      left breast  . Mastectomy modified radical  06/08/2012    Procedure: MASTECTOMY MODIFIED RADICAL;  Surgeon:  Edward Jolly, MD;  Location: [facility];  Service: General;  Laterality: Right;  . Portacath placement  06/08/2012    Procedure: INSERTION PORT-A-CATH;  Surgeon: Edward Jolly, MD;  Location: [facility];  Service: General;  Laterality: Left;  . Peg tube placement      Family History  Problem Relation Age of Onset  . Diabetes Mother   . Hypertension Mother   . Arthritis Mother   . Heart disease Mother   . Heart failure Mother   . Other Mother     benign breast mass  . Stroke Father   . Heart disease Father   . Diabetes Paternal Grandmother   . Colon cancer Paternal Grandfather     Allergies  Allergen Reactions  . Olmesartan Medoxomil Cough    REACTION: ? if cough  . Tetracycline Hcl     Unknown reaction, too long for patient to remember   . Venlafaxine     REACTION: severe dry moouth  . Adhesive [Tape] Rash    Current Outpatient Prescriptions on File Prior to Visit  Medication Sig Dispense Refill  . buPROPion (WELLBUTRIN) 75 MG tablet Take 75 mg by mouth 2 (two) times daily.  3  . clonazePAM (KLONOPIN) 0.5 MG tablet TAKE 1/2 TABLET TWICE DAILY 60 tablet 0  .  escitalopram (LEXAPRO) 20 MG tablet TAKE 1 TABLET EVERY DAY 30 tablet 3  . furosemide (LASIX) 20 MG tablet TAKE 1 TABLET BY MOUTH EVERY DAY 30 tablet 3  . HYDROcodone-homatropine (HYCODAN) 5-1.5 MG/5ML syrup Take 5 mLs by mouth every 8 (eight) hours as needed for cough. 120 mL 0  . letrozole (FEMARA) 2.5 MG tablet Take 2.5 mg by mouth daily.  12  . levothyroxine (SYNTHROID, LEVOTHROID) 150 MCG tablet TAKE 1 TABLET EVERY DAY 30 tablet 3  . metoprolol tartrate (LOPRESSOR) 25 MG tablet TAKE 1 TABLET BY MOUTH TWICE DAILY 60 tablet 3  . OLANZapine (ZYPREXA) 2.5 MG tablet TAKE 1 TABLET NIGHTLY AT BEDTIME AS NEEDED FOR SLEEP OR anxiety 30 tablet 3  . omeprazole (PRILOSEC) 20 MG capsule 1 tab po bid 60 capsule 1  . potassium chloride (K-DUR) 10 MEQ tablet TAKE 1 TABLET TWICE DAILY 60 tablet 3  . spironolactone (ALDACTONE) 25 MG tablet TAKE 1 TABLET EVERY DAY 30 tablet 2  . warfarin (COUMADIN) 3 MG tablet Take as directed by coumadin clinic 50 tablet 1   No current facility-administered medications on file prior to visit.    BP 100/50 mmHg  Pulse 60  Temp(Src) 97.8 F (36.6 C) (Oral)  Resp 18  Ht 5\' 4"  (1.626 m)  Wt 195 lb (88.451 kg)  BMI 33.46 kg/m2  SpO2 98%       Objective:   Physical Exam  Constitutional: She is oriented to person, place, and time. She appears well-developed and well-nourished. No distress.  HENT:  Head: Normocephalic and atraumatic.  Cardiovascular: Normal rate and regular rhythm.   No murmur heard. Pulmonary/Chest: Effort normal and breath sounds normal. No respiratory distress. She has no wheezes. She has no rales. She exhibits no tenderness.  Musculoskeletal: She exhibits no edema.  Lymphadenopathy:    She has no cervical adenopathy.  Neurological: She is alert and oriented to person, place, and time.  Psychiatric: She has a normal mood and affect. Her behavior is normal. Judgment and thought content normal.          Assessment & Plan:  I did discuss  with patient her behavior with staff on the  phone and requested that she not speak that way to our staff. She verbalized understanding.

## 2014-05-15 NOTE — Progress Notes (Signed)
Pre visit review using our clinic review tool, if applicable. No additional management support is needed unless otherwise documented below in the visit note. 

## 2014-05-16 ENCOUNTER — Ambulatory Visit
Admission: RE | Admit: 2014-05-16 | Discharge: 2014-05-16 | Disposition: A | Discharge status: Home / Self Care | Payer: Self-pay | Source: Ambulatory Visit

## 2014-05-16 ENCOUNTER — Telehealth: Payer: Self-pay | Admitting: Family

## 2014-05-16 ENCOUNTER — Telehealth: Payer: Self-pay | Admitting: *Deleted

## 2014-05-16 ENCOUNTER — Other Ambulatory Visit: Payer: Self-pay

## 2014-05-16 DIAGNOSIS — Z9011 Acquired absence of right breast and nipple: Secondary | ICD-10-CM

## 2014-05-16 DIAGNOSIS — Z1231 Encounter for screening mammogram for malignant neoplasm of breast: Secondary | ICD-10-CM

## 2014-05-16 NOTE — Telephone Encounter (Signed)
[employer] requested records from our office dated 03/12/2014 to present. Signed consent from patient. Requested records faxed to [facility] at [phone]. JG//CMA

## 2014-05-16 NOTE — Telephone Encounter (Signed)
Dr. Jana Hakim- please see CT scan of chest.  Note two new nodules worrisome for mets.  I wanted to bring this to your attention.  I have not yet spoken to the patient.  What do you recommend at this point, ?PET. Thanks for your help.

## 2014-05-18 DIAGNOSIS — J189 Pneumonia, unspecified organism: Secondary | ICD-10-CM | POA: Insufficient documentation

## 2014-05-18 NOTE — Assessment & Plan Note (Signed)
CT chest is performed and notes resolution of pneumonia.  Notes resolution of pneumonia, but also notes two noncalcified nodules in the right middle lobe, not present on the CT from 2010, therefore worrisome for possible metastaticinvolvement. Will discuss findings with Dr. Jana Hakim her oncologist.

## 2014-05-18 NOTE — Assessment & Plan Note (Signed)
+   hypoglycemia. Stop glimepiride. Decrease metformin to 500mg  twice daily.

## 2014-05-19 ENCOUNTER — Telehealth: Payer: Self-pay | Admitting: *Deleted

## 2014-05-19 ENCOUNTER — Other Ambulatory Visit: Payer: Self-pay | Admitting: Oncology

## 2014-05-19 ENCOUNTER — Other Ambulatory Visit: Payer: Self-pay | Admitting: *Deleted

## 2014-05-19 ENCOUNTER — Telehealth: Payer: Self-pay | Admitting: Oncology

## 2014-05-19 DIAGNOSIS — C50919 Malignant neoplasm of unspecified site of unspecified female breast: Secondary | ICD-10-CM

## 2014-05-19 MED ORDER — ESCITALOPRAM OXALATE 20 MG PO TABS: 20.0000 mg | ORAL_TABLET | Freq: Every day | ORAL | Status: DC

## 2014-05-19 MED ORDER — SPIRONOLACTONE 25 MG PO TABS: 25.0000 mg | ORAL_TABLET | Freq: Every day | ORAL | Status: DC

## 2014-05-19 MED ORDER — POTASSIUM CHLORIDE ER 10 MEQ PO TBCR: 10.0000 meq | EXTENDED_RELEASE_TABLET | Freq: Two times a day (BID) | ORAL | Status: DC

## 2014-05-19 MED ORDER — METFORMIN HCL 500 MG PO TABS: 500.0000 mg | ORAL_TABLET | Freq: Two times a day (BID) | ORAL | Status: DC

## 2014-05-19 MED ORDER — BUPROPION HCL 75 MG PO TABS: 75.0000 mg | ORAL_TABLET | Freq: Two times a day (BID) | ORAL | Status: DC

## 2014-05-19 MED ORDER — OLANZAPINE 2.5 MG PO TABS: ORAL_TABLET | ORAL | Status: DC

## 2014-05-19 MED ORDER — LEVOTHYROXINE SODIUM 150 MCG PO TABS: 150.0000 ug | ORAL_TABLET | Freq: Every day | ORAL | Status: DC

## 2014-05-19 MED ORDER — CLONAZEPAM 0.5 MG PO TABS: 0.2500 mg | ORAL_TABLET | Freq: Two times a day (BID) | ORAL | Status: DC

## 2014-05-19 MED ORDER — FUROSEMIDE 20 MG PO TABS: 20.0000 mg | ORAL_TABLET | Freq: Every day | ORAL | Status: DC

## 2014-05-19 MED ORDER — METOPROLOL TARTRATE 25 MG PO TABS: 25.0000 mg | ORAL_TABLET | Freq: Two times a day (BID) | ORAL | Status: DC

## 2014-05-19 NOTE — Telephone Encounter (Signed)
-----   Message from Chauncey Cruel, MD sent at 05/19/2014  7:26 AM EST ----- Let me call the patient and set her up for a PET  Will keep you in the loop. Thanks for the heads-up  GusM

## 2014-05-19 NOTE — Telephone Encounter (Signed)
Received faxed from [place] for new rxs of : All medications. Rxs printed for all except Clonazepam, warfarin, bupropion and letrozole. Is it ok to do 90 day supply of Clonazepam ?  Also received refill requests of: Warfarin, letrol and bupropion.  I do not see that we have refilled these for pt before? Also please advise re: possible drug-drug interactions below:  Very High   Drug-Drug: potassium chloride and spironolactone  Co-administration of Potassium Preparations and Potassium-Sparing Diuretics may cause hyperkalemia, possibly leading to cardiac arrhythmias or cardiac arrest. Details Override Reason.Marland KitchenMarland KitchenBenefit outweighs riskDose AppropriateClinician ReviewedDefer to RPh   spironolactone (ALDACTONE) 25 MG tablet  Prescription. Reordered.   potassium chloride (K-DUR) 10 MEQ tablet  Prescription. Reordered.   Discontinuespironolactone (ALDACTONE) 25 MG tablet  Prescription. Active.   Discontinuepotassium chloride (K-DUR) 10 MEQ tablet  Prescription. Active.   High   High Dose: escitalopram, 20 mg, Oral, Daily  Single dose of 20 mg exceeds recommended maximum of 10 mg, over by 100%  Daily dose of 20 mg exceeds recomnded maximum of 10 mg, over by 100

## 2014-05-19 NOTE — Telephone Encounter (Signed)
Refills sent. Clonazepam called to ?[facility] at [place] [address] x no refills as pt is taking 1/2 tablet twice a day. Faxes sent denying Letrozol and Warfarin. Notified pt.

## 2014-05-19 NOTE — Telephone Encounter (Signed)
Can only have 30 day supply at a time of clonazepam. OK to refill wellbutrin.  Letrozol should go to her oncologist and warfarin to the coumadin clinic.

## 2014-05-19 NOTE — Telephone Encounter (Signed)
[name] and advised on Jan adn Feb appt .Marland KitchenMarland KitchenMarland KitchenMarland Kitchenpt ok and aware

## 2014-05-19 NOTE — Telephone Encounter (Signed)
Pt called re: CT result. Advised pt per verbal from PCP that she has discussed CT with Dr Jana Hakim and he may want to do additional testing. Pt voices understanding and wanted to know if PCP thinks it is cancer. Advised pt we do not know which is why additional testing may be needed with Dr Jana Hakim.

## 2014-05-22 ENCOUNTER — Other Ambulatory Visit: Payer: Self-pay

## 2014-05-22 ENCOUNTER — Other Ambulatory Visit: Payer: Self-pay | Admitting: *Deleted

## 2014-05-22 ENCOUNTER — Telehealth: Payer: Self-pay | Admitting: Family

## 2014-05-22 DIAGNOSIS — C50911 Malignant neoplasm of unspecified site of right female breast: Secondary | ICD-10-CM

## 2014-05-22 MED ORDER — WARFARIN SODIUM 3 MG PO TABS: 3.0000 mg | ORAL_TABLET | Freq: Every day | ORAL | Status: DC

## 2014-05-22 MED ORDER — LETROZOLE 2.5 MG PO TABS: 2.5000 mg | ORAL_TABLET | Freq: Every day | ORAL | Status: DC

## 2014-05-22 NOTE — Telephone Encounter (Signed)
Forms were received and forwarded to [place]. JG//CMA

## 2014-05-22 NOTE — Telephone Encounter (Signed)
Caller name:[name] Coxe Relation to NO:TRRNHA Call back number:[phone] Pharmacy:  Reason for call: pt is changing insurances , and will have to [facility], pt is wanting to get verification if the forms has been received from [facility], they informed the pt that they were faxed over.

## 2014-05-22 NOTE — Telephone Encounter (Signed)
Rx sent per pt request 

## 2014-05-23 ENCOUNTER — Telehealth: Payer: Self-pay | Admitting: Family

## 2014-05-23 ENCOUNTER — Telehealth: Payer: Self-pay | Admitting: *Deleted

## 2014-05-23 MED ORDER — ESCITALOPRAM OXALATE 20 MG PO TABS: 20.0000 mg | ORAL_TABLET | Freq: Every day | ORAL | Status: DC

## 2014-05-23 MED ORDER — BUPROPION HCL 75 MG PO TABS: 75.0000 mg | ORAL_TABLET | Freq: Two times a day (BID) | ORAL | Status: DC

## 2014-05-23 MED ORDER — POTASSIUM CHLORIDE ER 10 MEQ PO TBCR: 10.0000 meq | EXTENDED_RELEASE_TABLET | Freq: Two times a day (BID) | ORAL | Status: DC

## 2014-05-23 MED ORDER — OLANZAPINE 2.5 MG PO TABS: ORAL_TABLET | ORAL | Status: DC

## 2014-05-23 MED ORDER — LEVOTHYROXINE SODIUM 150 MCG PO TABS: 150.0000 ug | ORAL_TABLET | Freq: Every day | ORAL | Status: DC

## 2014-05-23 MED ORDER — METOPROLOL TARTRATE 25 MG PO TABS: 25.0000 mg | ORAL_TABLET | Freq: Two times a day (BID) | ORAL | Status: DC

## 2014-05-23 MED ORDER — FUROSEMIDE 20 MG PO TABS: 20.0000 mg | ORAL_TABLET | Freq: Every day | ORAL | Status: DC

## 2014-05-23 MED ORDER — METFORMIN HCL 500 MG PO TABS: 500.0000 mg | ORAL_TABLET | Freq: Two times a day (BID) | ORAL | Status: DC

## 2014-05-23 MED ORDER — SPIRONOLACTONE 25 MG PO TABS: 25.0000 mg | ORAL_TABLET | Freq: Every day | ORAL | Status: DC

## 2014-05-23 NOTE — Telephone Encounter (Signed)
Spouse called reporting his insurance mandates use of [facility].  Wife needs letrozole refilled as she only has 7 pills left.  Given [phone] to [facility].  Called, order given and request shipment ASAP.  "It takes 8 to 10 days."  Ronn Melena to notify him it is okay if a few doses are missed while awaiting shipment.

## 2014-05-23 NOTE — Telephone Encounter (Signed)
Caller name: [name],[name] Relation to pt: spouse  Call back number: [phone] Pharmacy: [facility] [phone]  Reason for call:   spouse called to advise pharmacy has changed to [facility] and would like all medication sent thru mail order. Spouse insisted that he does not want wife to run out and please send any refill thru mail order because it takes a week to receive. Spouse states medication is good for now.  Insurance changed [employer] ID# [id number] [name],[name] [id number] (primary) [facility] refill [phone]

## 2014-05-23 NOTE — Telephone Encounter (Signed)
Refills sent and pt's spouse notified.

## 2014-05-23 NOTE — Telephone Encounter (Signed)
Caller name: Giammarco,[name] Relation to pt: spouse  Call back number: [phone] [facility]acy[facility]   Reason for call:  Spouse called back insisting patient only has 7 days left and wanted to ensure medication refills would be called in today. spouse did not know what medication needed to be refill.

## 2014-05-24 ENCOUNTER — Telehealth: Payer: Self-pay | Admitting: *Deleted

## 2014-05-24 NOTE — Telephone Encounter (Signed)
Patients husband called and stated that [facility] will not refill patients coumadin due to sig not being clear. Please advise. Thanks, MI

## 2014-05-25 ENCOUNTER — Telehealth: Payer: Self-pay | Admitting: Family

## 2014-05-25 MED ORDER — BUPROPION HCL 75 MG PO TABS: 75.0000 mg | ORAL_TABLET | Freq: Two times a day (BID) | ORAL | Status: DC

## 2014-05-25 NOTE — Telephone Encounter (Signed)
Caller name: donald Relation to pt: husband Call back number:  [phone] Pharmacy:  Reason for call:   Patient husband states that patient is out of bupropion and needs an emergency 7 day supply sent to [facility] pharmacy.  Now using [facility] for all medications.

## 2014-05-29 ENCOUNTER — Ambulatory Visit (INDEPENDENT_AMBULATORY_CARE_PROVIDER_SITE_OTHER): Payer: BLUE CROSS/BLUE SHIELD | Admitting: *Deleted

## 2014-05-29 DIAGNOSIS — Z86718 Personal history of other venous thrombosis and embolism: Secondary | ICD-10-CM

## 2014-05-29 DIAGNOSIS — I4891 Unspecified atrial fibrillation: Secondary | ICD-10-CM

## 2014-05-29 LAB — POCT INR: INR: 1.8

## 2014-06-01 ENCOUNTER — Other Ambulatory Visit: Payer: Self-pay

## 2014-06-02 ENCOUNTER — Telehealth: Payer: Self-pay | Admitting: Oncology

## 2014-06-02 NOTE — Telephone Encounter (Signed)
cld & spoke to pt to adv of r/s appt due to [facility]-pt understood

## 2014-06-05 ENCOUNTER — Ambulatory Visit (HOSPITAL_COMMUNITY)
Admission: RE | Admit: 2014-06-05 | Discharge: 2014-06-05 | Disposition: A | Discharge status: Home / Self Care | Payer: BLUE CROSS/BLUE SHIELD | Source: Ambulatory Visit | Attending: Oncology | Admitting: Oncology

## 2014-06-05 ENCOUNTER — Encounter (HOSPITAL_COMMUNITY): Payer: Self-pay

## 2014-06-05 ENCOUNTER — Other Ambulatory Visit (HOSPITAL_BASED_OUTPATIENT_CLINIC_OR_DEPARTMENT_OTHER): Payer: BLUE CROSS/BLUE SHIELD

## 2014-06-05 DIAGNOSIS — C50919 Malignant neoplasm of unspecified site of unspecified female breast: Secondary | ICD-10-CM

## 2014-06-05 DIAGNOSIS — C50911 Malignant neoplasm of unspecified site of right female breast: Secondary | ICD-10-CM

## 2014-06-05 DIAGNOSIS — C50111 Malignant neoplasm of central portion of right female breast: Secondary | ICD-10-CM

## 2014-06-05 LAB — COMPREHENSIVE METABOLIC PANEL (CC13)
ALBUMIN: 3.3 g/dL — AB (ref 3.5–5.0)
ALK PHOS: 152 U/L — AB (ref 40–150)
ALT: 38 U/L (ref 0–55)
AST: 62 U/L — ABNORMAL HIGH (ref 5–34)
Anion Gap: 10 mEq/L (ref 3–11)
BILIRUBIN TOTAL: 1.35 mg/dL — AB (ref 0.20–1.20)
BUN: 7.5 mg/dL (ref 7.0–26.0)
CHLORIDE: 109 meq/L (ref 98–109)
CO2: 24 meq/L (ref 22–29)
CREATININE: 0.7 mg/dL (ref 0.6–1.1)
Calcium: 8.9 mg/dL (ref 8.4–10.4)
EGFR: >90 mL/min/{1.73_m2} (ref >90)
GLUCOSE: 119 mg/dL (ref 70–140)
Potassium: 4.1 mEq/L (ref 3.5–5.1)
Sodium: 143 mEq/L (ref 136–145)
TOTAL PROTEIN: 7.1 g/dL (ref 6.4–8.3)

## 2014-06-05 LAB — CBC WITH DIFFERENTIAL/PLATELET
BASO%: 0.2 % (ref 0.0–2.0)
Basophils Absolute: 0 10*3/uL (ref 0.0–0.1)
EOS ABS: 0.1 10*3/uL (ref 0.0–0.5)
EOS%: 2.4 % (ref 0.0–7.0)
HEMATOCRIT: 43 % (ref 34.8–46.6)
HEMOGLOBIN: 14.3 g/dL (ref 11.6–15.9)
LYMPH#: 1 10*3/uL (ref 0.9–3.3)
LYMPH%: 21.6 % (ref 14.0–49.7)
MCH: 30.7 pg (ref 25.1–34.0)
MCHC: 33.3 g/dL (ref 31.5–36.0)
MCV: 92.3 fL (ref 79.5–101.0)
MONO#: 0.3 10*3/uL (ref 0.1–0.9)
MONO%: 7.1 % (ref 0.0–14.0)
NEUT#: 3.1 10*3/uL (ref 1.5–6.5)
NEUT%: 68.7 % (ref 38.4–76.8)
Platelets: 103 10*3/uL — ABNORMAL LOW (ref 145–400)
RBC: 4.66 10*6/uL (ref 3.70–5.45)
RDW: 14.3 % (ref 11.2–14.5)
WBC: 4.5 10*3/uL (ref 3.9–10.3)
nRBC: 0 % (ref 0–0)

## 2014-06-05 LAB — CANCER ANTIGEN 27.29: CA 27.29: 32 U/mL (ref 0–39)

## 2014-06-05 LAB — CEA: CEA: 2.1 ng/mL (ref 0.0–5.0)

## 2014-06-05 LAB — GLUCOSE, CAPILLARY: Glucose-Capillary: 119 mg/dL — ABNORMAL HIGH (ref 70–99)

## 2014-06-05 MED ORDER — FLUDEOXYGLUCOSE F - 18 (FDG) INJECTION
9.7500 | Freq: Once | INTRAVENOUS | Status: AC | PRN
  Administered 2014-06-05: 9.75 via INTRAVENOUS

## 2014-06-06 ENCOUNTER — Encounter: Payer: Self-pay | Admitting: Family

## 2014-06-10 IMAGING — PT NM PET TUM IMG INITIAL (PI) SKULL BASE T - THIGH
6 series · 25 of 25 positions shown · non-contrast
Comparison: None

CLINICAL DATA: Initial treatment strategy for right breast
carcinoma.

NUCLEAR MEDICINE PET SKULL BASE TO THIGH
Fasting Blood Glucose:  100
TECHNIQUE: 18.8 mCi F-18 FDG was injected intravenously. CT data
was obtained and used for attenuation correction and anatomic
localization only.  (This was not acquired as a diagnostic CT
examination.) Additional exam technical data entered on
technologist worksheet.

[Series 1: pet ac · axial · 3.3mm · 4.69mm/px · z∈[-870,+0]mm · 5 of 267 slices shown]
[im 1/267]
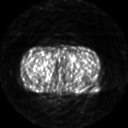
[im 67/267]
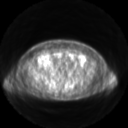
[im 134/267]
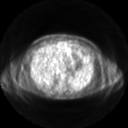
[im 200/267]
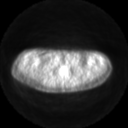
[im 267/267]
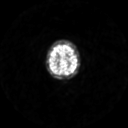

[Series 2: ct images · axial · 3.8mm · 0.98mm/px · z∈[-870,+0]mm · 5 of 267 slices shown]
[im 1/267]
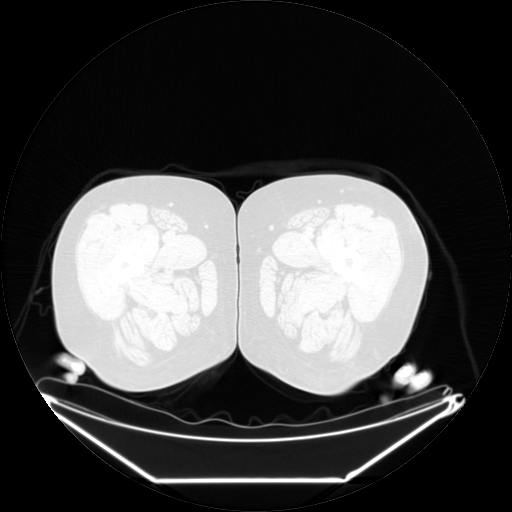
[im 67/267]
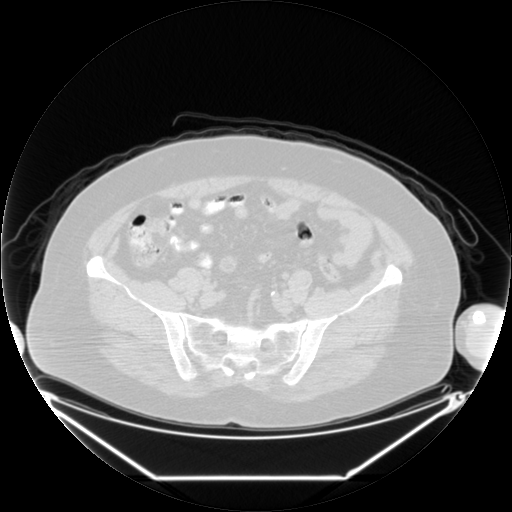
[im 134/267]
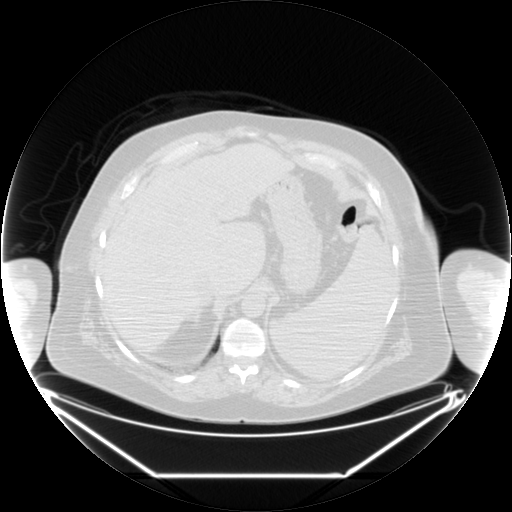
[im 200/267]
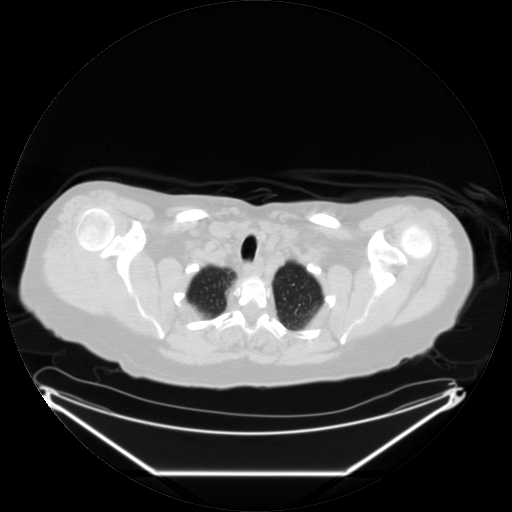
[im 267/267  brain]
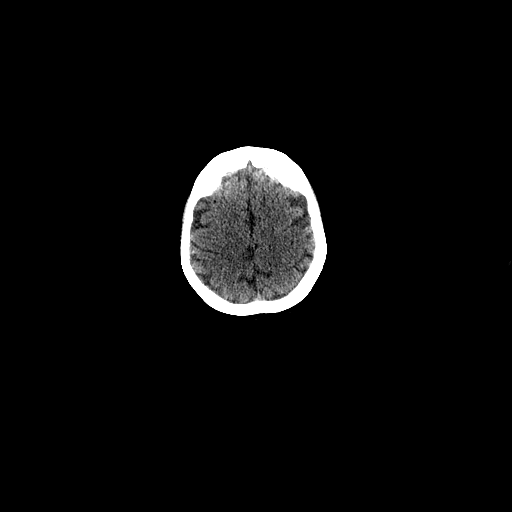

[Series 2: pet nac · axial · 3.3mm · 4.69mm/px · z∈[-870,+0]mm · 6 of 267 slices shown]
[im 1/267]
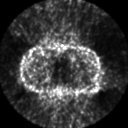
[im 54/267]
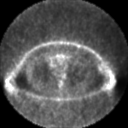
[im 107/267]
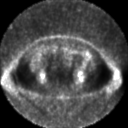
[im 160/267]
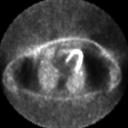
[im 213/267]
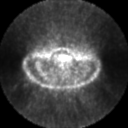
[im 267/267]
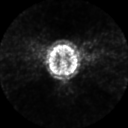

[Series 123: mip · coronal · 3.3mm · 4.69mm/px · 1 of 30 slices shown]
[im 1/30]
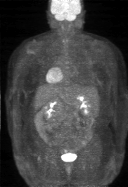

[Series 151: reformatted · axial · 3.3mm · 3.91mm/px · z∈[-870,+0]mm · 6 of 265 slices shown (1 of 2)]
[im 1/265]
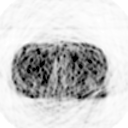
[im 53/265]
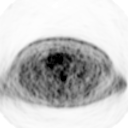
[im 106/265]
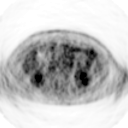
[im 159/265]
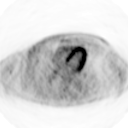
[im 212/265]
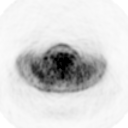
[im 265/265]
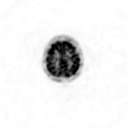

[Series 153: reformatted · coronal · 4.7mm · 6.98mm/px · 2 of 72 slices shown (2 of 2)]
[im 1/72]
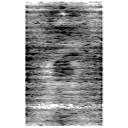
[im 72/72]
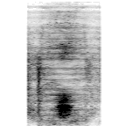

[25 of 25 positions shown; findings below may reference images not displayed]

FINDINGS: Neck: No hypermetabolic lymph nodes in the neck.

Chest:  No hypermetabolic mediastinal or hilar nodes.  No
hypermetabolic axillary lymph nodes identified.  Muscular activity
noted in left shoulder girdle.  No suspicious pulmonary nodules on
the CT scan.

Abdomen/Pelvis:  No abnormal hypermetabolic activity within the
liver, pancreas, adrenal glands, or spleen.  No hypermetabolic
lymph nodes in the abdomen or pelvis.  Incidental findings noted on
noncontrast CT or hepatic cirrhosis and mild splenomegaly,
consistent with portal venous hypertension.  No evidence of
ascites.  Several small uterine fibroids are also noted, at least
one showing metabolic activity.

Skeleton:  No focal hypermetabolic activity to suggest skeletal
metastasis.
IMPRESSION: 1.  No metabolically active malignancy identified.
2.  Incidental findings include hepatic cirrhosis, mild
splenomegaly, and small uterine fibroids.

## 2014-06-12 ENCOUNTER — Ambulatory Visit (INDEPENDENT_AMBULATORY_CARE_PROVIDER_SITE_OTHER): Payer: BLUE CROSS/BLUE SHIELD

## 2014-06-12 DIAGNOSIS — Z86718 Personal history of other venous thrombosis and embolism: Secondary | ICD-10-CM

## 2014-06-12 DIAGNOSIS — I4891 Unspecified atrial fibrillation: Secondary | ICD-10-CM

## 2014-06-12 LAB — POCT INR: INR: 1.4

## 2014-06-13 ENCOUNTER — Encounter: Payer: Self-pay | Admitting: Family

## 2014-06-16 ENCOUNTER — Telehealth: Payer: Self-pay | Admitting: Oncology

## 2014-06-16 ENCOUNTER — Ambulatory Visit (HOSPITAL_BASED_OUTPATIENT_CLINIC_OR_DEPARTMENT_OTHER): Payer: BLUE CROSS/BLUE SHIELD | Admitting: Oncology

## 2014-06-16 ENCOUNTER — Ambulatory Visit (HOSPITAL_BASED_OUTPATIENT_CLINIC_OR_DEPARTMENT_OTHER): Payer: BLUE CROSS/BLUE SHIELD

## 2014-06-16 VITALS — BP 136/54 | HR 71 | Temp 98.3°F | Resp 18 | Ht 64.0 in | Wt 198.9 lb

## 2014-06-16 DIAGNOSIS — M8580 Other specified disorders of bone density and structure, unspecified site: Secondary | ICD-10-CM

## 2014-06-16 DIAGNOSIS — C50911 Malignant neoplasm of unspecified site of right female breast: Secondary | ICD-10-CM

## 2014-06-16 DIAGNOSIS — R918 Other nonspecific abnormal finding of lung field: Secondary | ICD-10-CM

## 2014-06-16 DIAGNOSIS — Z170 Estrogen receptor positive status [ER+]: Secondary | ICD-10-CM

## 2014-06-16 DIAGNOSIS — C50111 Malignant neoplasm of central portion of right female breast: Secondary | ICD-10-CM

## 2014-06-16 LAB — CBC WITH DIFFERENTIAL/PLATELET
BASO%: 0.2 % (ref 0.0–2.0)
Basophils Absolute: 0 10*3/uL (ref 0.0–0.1)
EOS%: 2.5 % (ref 0.0–7.0)
Eosinophils Absolute: 0.1 10*3/uL (ref 0.0–0.5)
HCT: 42.6 % (ref 34.8–46.6)
HGB: 14.3 g/dL (ref 11.6–15.9)
LYMPH#: 1 10*3/uL (ref 0.9–3.3)
LYMPH%: 22.1 % (ref 14.0–49.7)
MCH: 31.2 pg (ref 25.1–34.0)
MCHC: 33.6 g/dL (ref 31.5–36.0)
MCV: 92.8 fL (ref 79.5–101.0)
MONO#: 0.4 10*3/uL (ref 0.1–0.9)
MONO%: 8.6 % (ref 0.0–14.0)
NEUT%: 66.6 % (ref 38.4–76.8)
NEUTROS ABS: 3 10*3/uL (ref 1.5–6.5)
Platelets: 131 10*3/uL — ABNORMAL LOW (ref 145–400)
RBC: 4.59 10*6/uL (ref 3.70–5.45)
RDW: 14.2 % (ref 11.2–14.5)
WBC: 4.4 10*3/uL (ref 3.9–10.3)

## 2014-06-16 LAB — COMPREHENSIVE METABOLIC PANEL (CC13)
ALT: 43 U/L (ref 0–55)
AST: 83 U/L — AB (ref 5–34)
Albumin: 3.5 g/dL (ref 3.5–5.0)
Alkaline Phosphatase: 162 U/L — ABNORMAL HIGH (ref 40–150)
Anion Gap: 10 mEq/L (ref 3–11)
BUN: 8.9 mg/dL (ref 7.0–26.0)
CALCIUM: 9.8 mg/dL (ref 8.4–10.4)
CO2: 23 meq/L (ref 22–29)
Chloride: 106 mEq/L (ref 98–109)
Creatinine: 0.7 mg/dL (ref 0.6–1.1)
EGFR: >90 mL/min/{1.73_m2} (ref >90)
GLUCOSE: 86 mg/dL (ref 70–140)
Potassium: 4.1 mEq/L (ref 3.5–5.1)
SODIUM: 140 meq/L (ref 136–145)
Total Bilirubin: 1.01 mg/dL (ref 0.20–1.20)
Total Protein: 7.7 g/dL (ref 6.4–8.3)

## 2014-06-16 NOTE — Progress Notes (Signed)
ID: [name]   DOB: 1955-05-14  MR#: [id number]  BHA#:[id number]  PCP: [name], NP GYN:  SU: [facility] OTHER XB:DZHGDJ [name], Shanon Ace, Roney Jaffe  CHIEF COMPLAINT:  Estrogen receptor positive Right Breast Cancer  CURRENT TREATMENT: Letrozole   HISTORY OF PRESENT ILLNESS: From the original consult note:  [name] noted a mass in her right breast mid December, and as it did not spontaneously resolve over a couple of weeks she brought it to her primary physician's attention. She was set up for diagnostic mammography and right breast ultrasonography at the breast Center 05/10/2012. (Note that the patient's most recent prior mammography had been in October 2008). The current study showed a spiculated mass in the superior subareolar portion of the right breast measuring approximately 5 cm and associated with pleomorphic calcifications. This was firm and palpable. There was right nipple retraction and skin thickening. Ultrasound confirmed an irregularly marginated hypoechoic mass measuring 3.5 cm by ultrasound, and an abnormal appearing lower right axillary lymph node measuring 2.6 cm.  Biopsies of both the breast mass and the abnormal appearing lymph node were performed 05/21/2012. Both showed an invasive ductal carcinoma, grade 2, with similar prognostic panels (the breast mass was 100% estrogen and 73% progesterone receptor positive, with an MIB-1 of 5%; the lymph node was 100% estrogen 100% progesterone receptor positive, with an MIB-1 of 20%). Both masses were HER-2 negative.  Breast MRI obtained at [facility] imaging 05/29/2012 confirmed a dominant mass in the retroareolar right breast measuring 4.4 cm maximally. There was a satellite nodule inferior and lateral to this mass, measuring 1.7 cm. There were no other masses in either breast. Aside from the previously biopsied lymph node there were other mildly enhancing level I right axillary lymph  nodes which did not appear pathologic. There was no other lymphadenopathy noted.   The patient's subsequent history is as detailed below.   INTERVAL HISTORY: [name] returns today for review of her restaging studies accompanied by her husband [name]. To summarize her recent history: She was admitted to [facility] with a diagnosis of possible pneumonia in late December 2015. Chest x-ray showed some increased density in the right upper lobe. CT scan was suggested and was obtained by Dr. Inda Castle. This showed 2 small nodules in the right middle lobe, measuring 8 and 4 mm respectively. Dr. Inda Castle then contacted [name] for further evaluation and we set [name] up for a PET scan and which was performed late January. This showed the 2 nodules in the lung not to be hypermetabolic. This of course does not prove that they are not malignant. More importantly, there was a 13.7 cm cystic/solid mass in the upper pelvis associated with adenopathy,. All of this was hypermetabolic. The patient is here to discuss those results today.   REVIEW OF SYSTEMS: In addition to the above, we had sent [name] for dermatologic consultation regarding a hyperpigmented lesion. This was biopsied and proved to be benign. [name]'s husband [name] lost his wife in week ago. He still actively grieving. Because of this and were visiting with [name] sister and sleeping in a different bed than usual. [name] fell out of the bed and bruised herself per to going the right upper arm, but also in a couple of other spots. Aside from these issues she denies unusual headaches, visual changes, cough, phlegm production, pleurisy, change in bowel or bladder habits, or focal neurologic symptoms. She admits to anxiety and depression. Detailed review of systems today  was otherwise stable.  PAST MEDICAL HISTORY: Past Medical History  Diagnosis Date  . Depression   . DVT (deep venous thrombosis)     hx of on HRT left leg ~2006  . GERD  (gastroesophageal reflux disease)   . Hyperlipidemia   . Hypertension   . Hypothyroidism   . PPD positive, treated     rx inh   . OSA on CPAP   . Diabetes mellitus   . 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 Breast cancer     a. 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 DJD (degenerative joint disease) of lumbar spine   . Chronic diastolic CHF (congestive heart failure)     a. 08/2012 Echo: EF 55-60%, no rwma, mod MR.  [name] A-fib     a. on amio for rate control/coumadin.  [name] Physical deconditioning   . Allergy   . Anemia     low iron hx  . Blood transfusion without reported diagnosis     2 u prbc  . Cataract     removed ou    PAST SURGICAL HISTORY: Past Surgical History  Procedure Laterality Date  . Tubal ligation    . Cholecystectomy    . Foot surgery    . Eye surgery    . Cataract extraction    . Percutaneous liver biopsy    . Breast biopsy      left breast  . Mastectomy modified radical  06/08/2012    Procedure: MASTECTOMY MODIFIED RADICAL;  Surgeon: Edward Jolly, MD;  Location: [facility];  Service: General;  Laterality: Right;  . Portacath placement  06/08/2012    Procedure: INSERTION PORT-A-CATH;  Surgeon: Edward Jolly, MD;  Location: [facility] OR;  Service: General;  Laterality: Left;  . Peg tube placement      FAMILY HISTORY Family History  Problem Relation Age of Onset  . Diabetes Mother   . Hypertension Mother   . Arthritis Mother   . Heart disease Mother   . Heart failure Mother   . Other Mother     benign breast mass  . Stroke Father   . Heart disease Father   . Diabetes Paternal Grandmother   . Colon cancer Paternal Grandfather    the patient's father died in his 39s with a history of dementia. He had had prior strokes. The patient's mother died in her 67s, with a history of congestive heart failure. Naimah had no brothers,  one sister. There is no history of breast or ovarian cancer in the family.  GYNECOLOGIC HISTORY: Menarche age 59, first live birth age 46, she is GX P2, menopause approximately 15 years ago, on hormone replacement until 2010.  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 Her husband [name] works for [employer]. Daughter Paul Half is a Physiological scientist and lives in [place]. Daughter Santiago Bur and her family (husband and 2 children aged 57 and 3 years) currently live with the patient. Markiya is a member of a [employer].   ADVANCED DIRECTIVES: Not in place  HEALTH MAINTENANCE: (Updated October 2014) History  Substance Use Topics  . Smoking status: Former Smoker -- 1.00 packs/day for 1 years  . Smokeless tobacco: Never Used  . Alcohol Use: No  Colonoscopy: Never  PAP: Does not recall  Bone density: Never  Lipid panel:   Allergies  Allergen Reactions  . Olmesartan Medoxomil Cough    REACTION: ? if cough  . Tetracycline Hcl     Unknown reaction, too long for patient to remember   . Venlafaxine     REACTION: severe dry moouth  . Adhesive [Tape] Rash    Current Outpatient Prescriptions  Medication Sig Dispense Refill  . buPROPion (WELLBUTRIN) 75 MG tablet Take 1 tablet (75 mg total) by mouth 2 (two) times daily. 14 tablet 0  . clonazePAM (KLONOPIN) 0.5 MG tablet Take 0.5 tablets (0.25 mg total) by mouth 2 (two) times daily. 30 tablet 0  . escitalopram (LEXAPRO) 20 MG tablet Take 1 tablet (20 mg total) by mouth daily. 90 tablet 1  . furosemide (LASIX) 20 MG tablet Take 1 tablet (20 mg total) by mouth daily. 90 tablet 1  . HYDROcodone-homatropine (HYCODAN) 5-1.5 MG/5ML syrup Take 5 mLs by mouth every 8 (eight) hours as needed for cough. 120 mL 0  . letrozole (FEMARA) 2.5 MG tablet Take 1 tablet (2.5 mg total) by mouth daily. 90 tablet 0  . levothyroxine  (SYNTHROID, LEVOTHROID) 150 MCG tablet Take 1 tablet (150 mcg total) by mouth daily. 90 tablet 1  . metFORMIN (GLUCOPHAGE) 500 MG tablet Take 1 tablet (500 mg total) by mouth 2 (two) times daily with a meal. 180 tablet 1  . metoprolol tartrate (LOPRESSOR) 25 MG tablet Take 1 tablet (25 mg total) by mouth 2 (two) times daily. 180 tablet 1  . OLANZapine (ZYPREXA) 2.5 MG tablet TAKE 1 TABLET NIGHTLY AT BEDTIME AS NEEDED FOR SLEEP OR anxiety 90 tablet 1  . omeprazole (PRILOSEC) 20 MG capsule 1 tab po bid 60 capsule 1  . potassium chloride (K-DUR) 10 MEQ tablet Take 1 tablet (10 mEq total) by mouth 2 (two) times daily. 180 tablet 1  . spironolactone (ALDACTONE) 25 MG tablet Take 1 tablet (25 mg total) by mouth daily. 90 tablet 1  . warfarin (COUMADIN) 3 MG tablet Take 1-2 tablets (3-6 mg total) by mouth daily. as directed by coumadin clinic 135 tablet 1   No current facility-administered medications for this visit.    OBJECTIVE: Middle-aged white woman in no acute distress Filed Vitals:   06/16/14 1042  BP: 136/54  Pulse: 71  Temp: 98.3 F (36.8 C)  Resp: 18     Body mass index is 34.12 kg/(m^2).    ECOG FS: 1 Filed Weights   06/16/14 1042  Weight: 198 lb 14.4 oz (90.22 kg)   Sclerae unicteric, normal EOMs Oropharynx clear and moist No cervical or supraclavicular adenopathy Lungs no rales or rhonchi Heart regular rate and rhythm Abd soft, obese, nontender, positive bowel sounds, no masses palpated MSK no focal spinal tenderness, no upper extremity lymphedema Neuro: nonfocal, well oriented, positive affect Breasts: Deferred Skin: There is a bruise in the right deltoid area, related to the patient's recent fall.  LAB RESULTS: Lab Results  Component Value Date   WBC 4.5 06/05/2014   NEUTROABS 3.1 06/05/2014   HGB 14.3 06/05/2014   HCT 43.0 06/05/2014   MCV 92.3 06/05/2014   PLT 103* 06/05/2014      Chemistry      Component Value Date/Time   NA 143 06/05/2014 0812   NA 139  04/26/2014 1342   K 4.1 06/05/2014 0812   K 4.0 04/26/2014 1342   CL 107 04/26/2014 1342   CL 104 10/29/2012 1058  CO2 24 06/05/2014 0812   CO2 21 04/26/2014 1342   BUN 7.5 06/05/2014 0812   BUN 6 04/26/2014 1342   CREATININE 0.7 06/05/2014 0812   CREATININE 0.7 04/26/2014 1342   CREATININE 0.69 11/10/2013 0828      Component Value Date/Time   CALCIUM 8.9 06/05/2014 0812   CALCIUM 9.2 04/26/2014 1342   ALKPHOS 152* 06/05/2014 0812   ALKPHOS 111 04/26/2014 1342   AST 62* 06/05/2014 0812   AST 79* 04/26/2014 1342   ALT 38 06/05/2014 0812   ALT 43* 04/26/2014 1342   BILITOT 1.35* 06/05/2014 0812   BILITOT 1.1 04/26/2014 1342     Results for CAITLAND, PORCHIA (MRN [id number]) as of 06/16/2014 11:38  Ref. Range 11/10/2013 08:28 12/27/2013 13:22 04/03/2014 08:51 04/26/2014 13:42 06/05/2014 08:12  AST Latest Range: 0-37 U/L  85 (H) 71 (H) 79 (H) 62 (H)  ALT Latest Range: 0-35 U/L  51 46 43 (H) 38    STUDIES: Nm Pet Image Restag (ps) Skull Base To Thigh  06/05/2014   CLINICAL DATA:  Subsequent treatment strategy for breast carcinoma. New pulmonary nodules.  EXAM: NUCLEAR MEDICINE PET SKULL BASE TO THIGH  TECHNIQUE: 9.8 mCi F-18 FDG was injected intravenously. Full-ring PET imaging was performed from the skull base to thigh after the radiotracer. CT data was obtained and used for attenuation correction and anatomic localization.  FASTING BLOOD GLUCOSE:  Value: 119 mg/dl  COMPARISON:  None.  FINDINGS: NECK  No hypermetabolic lymph nodes in the neck.  CHEST  Two pulmonary nodules in the right middle lobe are similar to CT CT exam 07/05/2012. Largest nodule measures 5 mm (image 37, series 7) compared to 6 mm on prior. Smaller nodule measures 4 mm (image 37) compared to 5 mm on prior. These nodules do not not have associated metabolic activity although the are small. There are no additional pulmonary nodules evident. No hypermetabolic mediastinal or hilar lymph nodes. Postsurgical change in the right  axilla right mastectomy anatomy.  ABDOMEN/PELVIS  Dominant abnormality within the abdomen pelvis is a new solid and cystic mass within the central upper pelvis measuring and 13.7 x 11.7 cm in axial dimension. This lesion has solid components along the right aspect of the lesion. There are several septations within the cystic and solid lesion. Some of the nodularity along the right aspect is hypermetabolic as well as several of the septations. For example hypermetabolic nodule along the right wall the mass on image 161 with SUV max 8.2. Hypermetabolic septum noted on image 161.  There is a hypermetabolic left external iliac lymph node measuring 10 mm (image 157 with SUV max 5.0). There are hypermetabolic left periaortic lymph nodes. For example 17 mm short axis lymph node left the aorta on image 129 with SUV max 9.1. Second hypermetabolic retroperitoneal nodule at the level of the left renal vein on image 115.  No abnormal hypermetabolic activity within the liver. No hypermetabolic mental peritoneal disease evident.  SKELETON  No focal hypermetabolic activity to suggest skeletal metastasis.  IMPRESSION: 1. Dominant finding is a new cystic and solid lesion within the central pelvis with hypermetabolic nodularity septations. Finding is most suggestive of a primary ovarian neoplasm. 2. Hypermetabolic iliac and para-aortic retroperitoneal metastatic lymph nodes. 3. Two pulmonary nodules within the right middle lobe are stable compared to prior PET-CT scan of 07/05/2012 and therefore likely benign. 4. Postsurgical change in the right chest wall and axilla without evidence of local breast cancer recurrence.  Findings conveyed toGUSTAV MAGRINAT's  Nurse Bledsoe 06/05/2014 at10:53.   Electronically Signed   By: Suzy Bouchard M.D.   On: 06/05/2014 10:53    CT CHEST WITHOUT CONTRAST  TECHNIQUE: Multidetector CT imaging of the chest was performed following the standard protocol without IV contrast.  COMPARISON:  Chest x-ray of 04/26/2014  FINDINGS: On lung window images, there is radiation fibrosis within the anterior right upper mid upper lung field which may account for the vague opacity questioned recently on chest x-ray. Also there is right apical pleural parenchymal scarring present. No infiltrate or effusion is seen. Changes of right mastectomy are noted and surgical clips are present in the right axilla from prior right axillary nodal dissection. However, on lung window images, there are 2 new noncalcified nodules present, both in the right middle lobe, one of 8 mm and a second of 4 mm in diameter. These are not seen on the prior CT chest of 11/07/2008, and therefore these nodules are worrisome for metastatic involvement. No left lung nodules are seen. Somewhat prominent interstitial markings are present primarily at the lung bases posteriorly. These may be chronic in nature but mild fluid overload cannot be excluded. The central airway is patent.  On soft tissue window images, on this unenhanced study, there is no change in previously noted mediastinal lymph nodes. None appear pathologically enlarged. Coronary artery calcifications are present primarily in the distribution of the left anterior descending artery. No pericardial effusion is seen. The liver appears somewhat nodular peripherally and changes of cirrhosis are a consideration with splenomegaly also present. No bony abnormality is seen.  IMPRESSION: 1. The area questioned by chest x-ray in the right upper lobe probably represents radiation fibrosis. No right upper lobe lesion is seen. 2. 2 noncalcified nodules in the right middle lobe, not present on the CT from 2010, therefore worrisome for possible metastatic involvement. 3. Prominent interstitial markings primarily at the lung bases posteriorly may be chronic, but mild fluid overload cannot be excluded. 4. Nodular periphery of the liver may represent changes of  cirrhosis with splenomegaly also present.   Electronically Signed  By: Ivar Drape M.D.  On: 05/16/2014 08:18   ASSESSMENT: 59 y.o. [name]   (1)  status post right mastectomy  06/08/2012 for a pT3, pN1a, stage IIIA invasive ductal carcinoma, grade 2,  estrogen and progesterone receptor positive, HER-2/neu negative, with an MIB-1 of 20%.    (2)  treated in the adjuvant setting with docetaxel/ cyclophosphamide given every 3 weeks. There were multiple and severe complications, and the  patient tolerated only two cycles, last dose 07/29/2012  (3) letrozole started 08/16/2012  (a) osteopenia, with a T score of -1.14 April 2013  (4) postmastectomy radiation completed 12/24/2012  (5)  comorbidities include diabetes, hypertension, chronic liver disease with cirrhosis and fatty liver, and nutritional disturbance with temporarily dependence on PEG feeds (discontinued July 2014).  (6) restaging studies January 2016 show  (a) 8 mm and 4 mm Right middle lobe lung nodules, not metabolically active on PET   (b) hypermetabolic 56.2 cm mixed solid/cystic lesion in upper pelvis, with associated adenopathy   PLAN: I spent approximately 45 minutes going over Liala situation with her and her husband. I have also discussed her situation with our GYN_ONC team.   She has 2 lung nodules. These are nonspecific and were not hot on PET. These Rhonda need follow-up and she Rhonda have a CT of the chest again in approximately 3 months.  I am more concerned regarding the pelvic mass. I'm  going to obtain his CA 125 today, and that should be ready by the time she sees Dr. Aldean Ast next week. Most likely she Rhonda need resection of this mass with lymphadenectomy.  [name] was very emotional during today's visit. Serita was very practical. We simply need to clarify what we are dealing with. She understands that this is ovarian cancer she Rhonda need adjuvant chemotherapy.    Chauncey Cruel,  MD      06/16/2014

## 2014-06-17 LAB — CA 125: CA 125: 71 U/mL — AB (ref <35)

## 2014-06-17 LAB — CA 125(PREVIOUS METHOD): CA 125: 52.5 U/mL — AB (ref 0.0–30.2)

## 2014-06-19 ENCOUNTER — Telehealth: Payer: Self-pay

## 2014-06-19 ENCOUNTER — Telehealth: Payer: Self-pay | Admitting: Nurse Practitioner

## 2014-06-19 ENCOUNTER — Telehealth: Payer: Self-pay | Admitting: Oncology

## 2014-06-19 NOTE — Telephone Encounter (Signed)
Patient's husband called to clarify appointments. Informed of new patient consult with Dr. Fermin Schwab on 06/23/14 Friday at 11:00. Patient's husband verbalized understanding and confirms appointment.

## 2014-06-20 NOTE — Telephone Encounter (Signed)
Charting error.

## 2014-06-22 ENCOUNTER — Ambulatory Visit (INDEPENDENT_AMBULATORY_CARE_PROVIDER_SITE_OTHER): Payer: BLUE CROSS/BLUE SHIELD | Admitting: Pharmacist Clinician (PhC)/ Clinical Pharmacy Specialist

## 2014-06-22 DIAGNOSIS — I4891 Unspecified atrial fibrillation: Secondary | ICD-10-CM

## 2014-06-22 DIAGNOSIS — Z86718 Personal history of other venous thrombosis and embolism: Secondary | ICD-10-CM

## 2014-06-22 LAB — POCT INR: INR: 1.9

## 2014-06-23 ENCOUNTER — Encounter (HOSPITAL_COMMUNITY): Payer: Self-pay

## 2014-06-23 ENCOUNTER — Encounter: Payer: Self-pay | Admitting: Gynecology

## 2014-06-23 ENCOUNTER — Other Ambulatory Visit: Payer: Self-pay

## 2014-06-23 ENCOUNTER — Encounter (HOSPITAL_COMMUNITY)
Admission: RE | Admit: 2014-06-23 | Discharge: 2014-06-23 | Disposition: A | Discharge status: Home / Self Care | Payer: BLUE CROSS/BLUE SHIELD | Source: Ambulatory Visit | Attending: Gynecologic Oncology | Admitting: Gynecologic Oncology

## 2014-06-23 ENCOUNTER — Encounter: Payer: Self-pay | Admitting: Gynecologic Oncology

## 2014-06-23 ENCOUNTER — Telehealth: Payer: Self-pay | Admitting: Cardiovascular Disease

## 2014-06-23 ENCOUNTER — Ambulatory Visit: Payer: BLUE CROSS/BLUE SHIELD | Attending: Gynecology | Admitting: Gynecology

## 2014-06-23 DIAGNOSIS — R591 Generalized enlarged lymph nodes: Secondary | ICD-10-CM

## 2014-06-23 DIAGNOSIS — R1900 Intra-abdominal and pelvic swelling, mass and lump, unspecified site: Secondary | ICD-10-CM

## 2014-06-23 DIAGNOSIS — K219 Gastro-esophageal reflux disease without esophagitis: Secondary | ICD-10-CM | POA: Diagnosis not present

## 2014-06-23 DIAGNOSIS — Z87891 Personal history of nicotine dependence: Secondary | ICD-10-CM | POA: Insufficient documentation

## 2014-06-23 DIAGNOSIS — I4891 Unspecified atrial fibrillation: Secondary | ICD-10-CM | POA: Insufficient documentation

## 2014-06-23 DIAGNOSIS — R971 Elevated cancer antigen 125 [CA 125]: Secondary | ICD-10-CM

## 2014-06-23 DIAGNOSIS — Z9011 Acquired absence of right breast and nipple: Secondary | ICD-10-CM | POA: Diagnosis not present

## 2014-06-23 DIAGNOSIS — I10 Essential (primary) hypertension: Secondary | ICD-10-CM | POA: Insufficient documentation

## 2014-06-23 DIAGNOSIS — Z86718 Personal history of other venous thrombosis and embolism: Secondary | ICD-10-CM | POA: Diagnosis not present

## 2014-06-23 DIAGNOSIS — E119 Type 2 diabetes mellitus without complications: Secondary | ICD-10-CM | POA: Insufficient documentation

## 2014-06-23 DIAGNOSIS — Z7901 Long term (current) use of anticoagulants: Secondary | ICD-10-CM | POA: Diagnosis not present

## 2014-06-23 DIAGNOSIS — F329 Major depressive disorder, single episode, unspecified: Secondary | ICD-10-CM | POA: Insufficient documentation

## 2014-06-23 DIAGNOSIS — Z79899 Other long term (current) drug therapy: Secondary | ICD-10-CM | POA: Diagnosis not present

## 2014-06-23 DIAGNOSIS — G4733 Obstructive sleep apnea (adult) (pediatric): Secondary | ICD-10-CM | POA: Diagnosis not present

## 2014-06-23 DIAGNOSIS — E039 Hypothyroidism, unspecified: Secondary | ICD-10-CM | POA: Insufficient documentation

## 2014-06-23 DIAGNOSIS — Z9049 Acquired absence of other specified parts of digestive tract: Secondary | ICD-10-CM | POA: Insufficient documentation

## 2014-06-23 DIAGNOSIS — Z853 Personal history of malignant neoplasm of breast: Secondary | ICD-10-CM | POA: Insufficient documentation

## 2014-06-23 LAB — PROTIME-INR
INR: 2.13 — AB (ref 0.00–1.49)
Prothrombin Time: 24 seconds — ABNORMAL HIGH (ref 11.6–15.2)

## 2014-06-23 NOTE — Telephone Encounter (Signed)
New message     Request for surgical clearance:  What type of surgery is being performed? abd hysterectomy 1. When is this surgery scheduled? 06-27-14   2. Are there any medications that need to be held prior to surgery and how long?stop coumadin or lovenox bridge?------INR today was 1.9  3. Name of physician performing surgery? Dr Denman George  4. What is your office phone and fax number? Fax [phone]

## 2014-06-23 NOTE — Telephone Encounter (Signed)
Patient's last office visit with Dr. Acie Fredrickson 9/14 - was scheduled for 6 month follow-up but has not been seen since that time

## 2014-06-23 NOTE — Progress Notes (Signed)
Consult Note: Gyn-Onc   [name] 59 y.o. female  Chief Complaint  Patient presents with  . New Patient    Mass on ovary    Assessment : Large pelvic mass and retroperitoneal adenopathy with increased uptake on PET scan. Elevated CA-125 most consistent with ovarian cancer.  Plan: I recommend the patient undergo initial debulking surgery and this is scheduled for Tuesday, February 16.  I lengthy discussion with the patient and her husband regarding the imaging and laboratory findings and recommended she undergo surgical staging and debulking. They are eager to proceed as quickly as possible. Risks of surgery were reviewed area the patient understands that she is at increased risk due to her multiple medical problems. Given her past history of DVT, we will initiate Lovenox to have a prophylactic dose administered just prior to surgery.  Given the patient's use of warfarin for her atrial fibrillation we contact her cardiologist who is in agreement to discontinue the warfarin and does not recommend a bridge.  Patient understands that surgery will include total bowel hysterectomy bilateral salpingo-oophorectomy omentectomy and attempt at resection of the left periaortic lymph nodes. They also understand that postoperatively she will likely need adjuvant chemotherapy. Further, they're aware that this may be metastatic breast cancer rather than a primary ovarian cancer. All other questions are answered and we will expedite having the patient seen and [facility].  HPI: 59 year old white female seen in consultation request of Dr. Jana Hakim regarding management of a newly diagnosed pelvic mass. Patient has a past history of breast cancer in January 2014 and is been under the care of Dr. Jana Hakim. As part of the workup for pulmonary nodules she underwent a PET scan on 06/05/2014 which showed no evidence of hypermetabolic uptake in the lungs however a 13.7 x 11.7 cm pelvic mass with hypermetabolic nodules  was discovered along with left external iliac and periaortic adenopathy. CA-125 value was 71 units per mL. The patient notes some abdominal discomfort and bloating.  She has no other past gynecologic history and there is no family history of gynecologic breast or colon cancer.  Review of Systems:10 point review of systems is negative except as noted in interval history.   Vitals: There were no vitals taken for this visit.  Physical Exam: General : The patient is a healthy woman in no acute distress.  HEENT: normocephalic, extraoccular movements normal; neck is supple without thyromegally  Lynphnodes: Supraclavicular and inguinal nodes not enlarged  Abdomen: Soft, non-tender, slightly distended, no ascites, no organomegally, no masses, no hernias  Pelvic:  EGBUS: Normal female  Vagina: Normal, no lesions  Urethra and Bladder: Normal, non-tender  Cervix: Normal  Uterus: Difficult to assess given the patient's habitus. There is fullness throughout the pelvis. Bi-manual examination: Non-tender; no adenxal masses or nodularity  Rectal: normal sphincter tone, no masses, no blood  Lower extremities: No edema or varicosities. Normal range of motion      Allergies  Allergen Reactions  . Olmesartan Medoxomil Cough    REACTION: ? if cough  . Tetracycline Hcl     Unknown reaction, too long for patient to remember   . Venlafaxine     REACTION: severe dry moouth  . Adhesive [Tape] Rash    Past Medical History  Diagnosis Date  . Depression   . DVT (deep venous thrombosis)     hx of on HRT left leg ~2006  . GERD (gastroesophageal reflux disease)   . Hyperlipidemia   . Hypertension   . Hypothyroidism   .  PPD positive, treated     rx inh   . OSA on CPAP   . Diabetes mellitus   . 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 Breast cancer     a. 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 DJD (degenerative joint disease) of lumbar spine   . Chronic diastolic CHF (congestive heart failure)     a. 08/2012 Echo: EF 55-60%, no rwma, mod MR.  [name] A-fib     a. on amio for rate control/coumadin.  [name] Physical deconditioning   . Allergy   . Anemia     low iron hx  . Blood transfusion without reported diagnosis     2 u prbc  . Cataract     removed ou    Past Surgical History  Procedure Laterality Date  . Tubal ligation    . Cholecystectomy    . Foot surgery    . Eye surgery    . Cataract extraction    . Percutaneous liver biopsy    . Breast biopsy      left breast  . Mastectomy modified radical  06/08/2012    Procedure: MASTECTOMY MODIFIED RADICAL;  Surgeon: Edward Jolly, MD;  Location: [facility];  Service: General;  Laterality: Right;  . Portacath placement  06/08/2012    Procedure: INSERTION PORT-A-CATH;  Surgeon: Edward Jolly, MD;  Location: [facility];  Service: General;  Laterality: Left;  . Peg tube placement      Current Outpatient Prescriptions  Medication Sig Dispense Refill  . buPROPion (WELLBUTRIN) 75 MG tablet Take 1 tablet (75 mg total) by mouth 2 (two) times daily. 14 tablet 0  . clonazePAM (KLONOPIN) 0.5 MG tablet Take 0.5 tablets (0.25 mg total) by mouth 2 (two) times daily. 30 tablet 0  . escitalopram (LEXAPRO) 20 MG tablet Take 1 tablet (20 mg total) by mouth daily. 90 tablet 1  . furosemide (LASIX) 20 MG tablet Take 1 tablet (20 mg total) by mouth daily. 90 tablet 1  . glimepiride (AMARYL) 2 MG tablet   1  . HYDROcodone-homatropine (HYCODAN) 5-1.5 MG/5ML syrup Take 5 mLs by mouth every 8 (eight) hours as needed for cough. 120 mL 0  . letrozole (FEMARA) 2.5 MG tablet Take 1 tablet (2.5 mg total) by mouth daily. 90 tablet 0  . levothyroxine (SYNTHROID, LEVOTHROID) 150 MCG tablet Take 1 tablet (150 mcg total) by mouth daily. 90 tablet 1  . metFORMIN (GLUCOPHAGE) 500 MG tablet Take 1 tablet (500 mg total) by mouth 2 (two) times  daily with a meal. 180 tablet 1  . metoprolol tartrate (LOPRESSOR) 25 MG tablet Take 1 tablet (25 mg total) by mouth 2 (two) times daily. 180 tablet 1  . OLANZapine (ZYPREXA) 2.5 MG tablet TAKE 1 TABLET NIGHTLY AT BEDTIME AS NEEDED FOR SLEEP OR anxiety 90 tablet 1  . omeprazole (PRILOSEC) 20 MG capsule 1 tab po bid 60 capsule 1  . potassium chloride (K-DUR,KLOR-CON) 10 MEQ tablet Take 10 mEq by mouth 2 (two) times daily.  1  . spironolactone (ALDACTONE) 25 MG tablet Take 1 tablet (25 mg total) by mouth daily. 90 tablet 1  . warfarin (COUMADIN) 3 MG tablet Take 1-2 tablets (3-6 mg total) by mouth daily. as directed by coumadin clinic 135 tablet 1  . potassium chloride (K-DUR) 10 MEQ tablet Take 10 mEq by mouth 2 (  two) times daily.  3   No current facility-administered medications for this visit.    History   Social History  . Marital Status: Married    Spouse Name: N/A  . Number of Children: 2  . Years of Education: N/A   Occupational History  . [place] History Main Topics  . Smoking status: Former Smoker -- 1.00 packs/day for 1 years  . Smokeless tobacco: Never Used  . Alcohol Use: No  . Drug Use: No     Comment: quit age 48 only smoked as a teen 1 year  . Sexual Activity: No   Other Topics Concern  . 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     Family History  Problem Relation Age of Onset  . Diabetes Mother   . Hypertension Mother   . Arthritis Mother   . Heart disease Mother   . Heart failure Mother   . Other Mother     benign breast mass  . Stroke Father   . Heart disease Father   . Diabetes Paternal Grandmother   . Colon cancer Paternal Lindajo Royal, MD 06/23/2014, 9:38 AM

## 2014-06-23 NOTE — Telephone Encounter (Signed)
I received a call from the GYN office asking how long to hold the coumadin. I gave the OK to hold coumadin for 5 days prior. And restart as soon as was safe.

## 2014-06-23 NOTE — Patient Instructions (Addendum)
[name]  06/23/2014   Your procedure is scheduled on: Tuesday February 16th, 2016                                        BRING CPAP MASK AND TUBING  Report to [facility] Main  Entrance and follow signs to               [place] at 1250 pm  Call this number if you have problems the morning of surgery [phone]   Remember: [place] not eat food  :After Midnight Sunday night              Clear liquids all day Monday February 15th, 2015, may have clear liquids up until 920 am day of surgery Tuesday February 16th, 2016, no clear liquids after 920 am day of surgery.     Take these medicines the morning of surgery with A SIP OF WATER: Well butrin, Klonopin, Levothyroxine, Metoprolol tartrate, Omeprazole if needrf                              You may not have any metal on your body including hair pins and              piercings  Do not wear jewelry, make-up, lotions, powders or perfumes.             Do not wear nail polish.  Do not shave  48 hours prior to surgery.              Men may shave face and neck.   Do not bring valuables to the hospital. [place].  Contacts, dentures or bridgework may not be worn into surgery.  Leave suitcase in the car. After surgery it may be brought to your room.     Patients discharged the day of surgery will not be allowed to drive home.  Name and phone number of your driver:  Special Instructions: N/A              Please read over the following fact sheets you were given: _____________________________________________________________________             [facility] - Preparing for Surgery Before surgery, you can play an important role.  Because skin is not sterile, your skin needs to be as free of germs as possible.  You can reduce the number of germs on your skin by washing with CHG (chlorahexidine  gluconate) soap before surgery.  CHG is an antiseptic cleaner which kills germs and bonds with the skin to continue killing germs even after washing. Please DO NOT use if you have an allergy to CHG or antibacterial soaps.  If your skin becomes reddened/irritated stop using the CHG and inform your nurse when you arrive at Short Stay. Do not shave (including legs and underarms) for at least 48 hours prior to the first CHG shower.  You may shave your face/neck. Please follow these instructions carefully:  1.  Shower with CHG Soap the night before surgery  and the  morning of Surgery.  2.  If you choose to wash your hair, wash your hair first as usual with your  normal  shampoo.  3.  After you shampoo, rinse your hair and body thoroughly to remove the  shampoo.                           4.  Use CHG as you would any other liquid soap.  You can apply chg directly  to the skin and wash                       Gently with a scrungie or clean washcloth.  5.  Apply the CHG Soap to your body ONLY FROM THE NECK DOWN.   Do not use on face/ open                           Wound or open sores. Avoid contact with eyes, ears mouth and genitals (private parts).                       Wash face,  Genitals (private parts) with your normal soap.             6.  Wash thoroughly, paying special attention to the area where your surgery  will be performed.  7.  Thoroughly rinse your body with warm water from the neck down.  8.  DO NOT shower/wash with your normal soap after using and rinsing off  the CHG Soap.                9.  Pat yourself dry with a clean towel.            10.  Wear clean pajamas.            11.  Place clean sheets on your bed the night of your first shower and do not  sleep with pets. Day of Surgery : Do not apply any lotions/deodorants the morning of surgery.  Please wear clean clothes to the hospital/surgery center.  FAILURE TO FOLLOW THESE INSTRUCTIONS MAY RESULT IN THE CANCELLATION OF YOUR  SURGERY PATIENT SIGNATURE_________________________________  NURSE SIGNATURE__________________________________  ________________________________________________________________________    CLEAR LIQUID DIET   Foods Allowed                                                                     Foods Excluded  Coffee and tea, regular and decaf                             liquids that you cannot  Plain Jell-O in any flavor                                             see through such as: Fruit ices (not with fruit pulp)  milk, soups, orange juice  Iced Popsicles                                    All solid food Carbonated beverages, regular and diet                                    Cranberry, grape and apple juices Sports drinks like Gatorade Lightly seasoned clear broth or consume(fat free) Sugar, honey syrup  Sample Menu Breakfast                                Lunch                                     Supper Cranberry juice                    Beef broth                            Chicken broth Jell-O                                     Grape juice                           Apple juice Coffee or tea                        Jell-O                                      Popsicle                                                Coffee or tea                        Coffee or tea  _____________________________________________________________________    Incentive Spirometer  An incentive spirometer is a tool that can help keep your lungs clear and active. This tool measures how well you are filling your lungs with each breath. Taking long deep breaths may help reverse or decrease the chance of developing breathing (pulmonary) problems (especially infection) following:  A long period of time when you are unable to move or be active. BEFORE THE PROCEDURE   If the spirometer includes an indicator to show your best effort, your nurse or respiratory therapist  will set it to a desired goal.  If possible, sit up straight or lean slightly forward. Try not to slouch.  Hold the incentive spirometer in an upright position. INSTRUCTIONS FOR USE   Sit on the edge of your bed if possible, or sit up as far as you can in bed or on a chair.  Hold the incentive spirometer in an upright position.  Breathe out normally.  Place the mouthpiece in your mouth and seal your lips tightly around it.  Breathe in slowly and as deeply as possible, raising the piston or the ball toward the top of the column.  Hold your breath for 3-5 seconds or for as long as possible. Allow the piston or ball to fall to the bottom of the column.  Remove the mouthpiece from your mouth and breathe out normally.  Rest for a few seconds and repeat Steps 1 through 7 at least 10 times every 1-2 hours when you are awake. Take your time and take a few normal breaths between deep breaths.  The spirometer may include an indicator to show your best effort. Use the indicator as a goal to work toward during each repetition.  After each set of 10 deep breaths, practice coughing to be sure your lungs are clear. If you have an incision (the cut made at the time of surgery), support your incision when coughing by placing a pillow or rolled up towels firmly against it. Once you are able to get out of bed, walk around indoors and cough well. You may stop using the incentive spirometer when instructed by your caregiver.  RISKS AND COMPLICATIONS  Take your time so you do not get dizzy or light-headed.  If you are in pain, you may need to take or ask for pain medication before doing incentive spirometry. It is harder to take a deep breath if you are having pain. AFTER USE  Rest and breathe slowly and easily.  It can be helpful to keep track of a log of your progress. Your caregiver can provide you with a simple table to help with this. If you are using the spirometer at home, follow these  instructions: [place] IF:   You are having difficultly using the spirometer.  You have trouble using the spirometer as often as instructed.  Your pain medication is not giving enough relief while using the spirometer.  You develop fever of 100.5 F (38.1 C) or higher. SEEK IMMEDIATE MEDICAL CARE IF:   You cough up bloody sputum that had not been present before.  You develop fever of 102 F (38.9 C) or greater.  You develop worsening pain at or near the incision site. MAKE SURE YOU:   Understand these instructions.  Will watch your condition.  Will get help right away if you are not doing well or get worse. Document Released: 09/08/2006 Document Revised: 07/21/2011 Document Reviewed: 11/09/2006 Exit[employer] Patient Information 2014 [employer], [place].   ________________________________________________________________________  WHAT IS A BLOOD TRANSFUSION? Blood Transfusion Information  A transfusion is the replacement of blood or some of its parts. Blood is made up of multiple cells which provide different functions.  Red blood cells carry oxygen and are used for blood loss replacement.  White blood cells fight against infection.  Platelets control bleeding.  Plasma helps clot blood.  Other blood products are available for specialized needs, such as hemophilia or other clotting disorders. BEFORE THE TRANSFUSION  Who gives blood for transfusions?   Healthy volunteers who are fully evaluated to make sure their blood is safe. This is blood bank blood. Transfusion therapy is the safest it has ever been in the practice of medicine. Before blood is taken from a donor, a complete history is taken to make sure that person has no history of diseases nor engages in risky social behavior (examples are intravenous drug use or sexual activity with multiple partners). The donor's travel history is screened  to minimize risk of transmitting infections, such as malaria. The donated  blood is tested for signs of infectious diseases, such as HIV and hepatitis. The blood is then tested to be sure it is compatible with you in order to minimize the chance of a transfusion reaction. If you or a relative donates blood, this is often done in anticipation of surgery and is not appropriate for emergency situations. It takes many days to process the donated blood. RISKS AND COMPLICATIONS Although transfusion therapy is very safe and saves many lives, the main dangers of transfusion include:   Getting an infectious disease.  Developing a transfusion reaction. This is an allergic reaction to something in the blood you were given. Every precaution is taken to prevent this. The decision to have a blood transfusion has been considered carefully by your caregiver before blood is given. Blood is not given unless the benefits outweigh the risks. AFTER THE TRANSFUSION  Right after receiving a blood transfusion, you will usually feel much better and more energetic. This is especially true if your red blood cells have gotten low (anemic). The transfusion raises the level of the red blood cells which carry oxygen, and this usually causes an energy increase.  The nurse administering the transfusion will monitor you carefully for complications. HOME CARE INSTRUCTIONS  No special instructions are needed after a transfusion. You may find your energy is better. Speak with your caregiver about any limitations on activity for underlying diseases you may have. SEEK MEDICAL CARE IF:   Your condition is not improving after your transfusion.  You develop redness or irritation at the intravenous (IV) site. SEEK IMMEDIATE MEDICAL CARE IF:  Any of the following symptoms occur over the next 12 hours:  Shaking chills.  You have a temperature by mouth above 102 F (38.9 C), not controlled by medicine.  Chest, back, or muscle pain.  People around you feel you are not acting correctly or are  confused.  Shortness of breath or difficulty breathing.  Dizziness and fainting.  You get a rash or develop hives.  You have a decrease in urine output.  Your urine turns a dark color or changes to pink, red, or brown. Any of the following symptoms occur over the next 10 days:  You have a temperature by mouth above 102 F (38.9 C), not controlled by medicine.  Shortness of breath.  Weakness after normal activity.  The white part of the eye turns yellow (jaundice).  You have a decrease in the amount of urine or are urinating less often.  Your urine turns a dark color or changes to pink, red, or brown. Document Released: 04/25/2000 Document Revised: 07/21/2011 Document Reviewed: 12/13/2007 [facility] Patient Information 2014 [place], [place].  _______________________________________________________________________

## 2014-06-23 NOTE — Progress Notes (Signed)
CBC WITH DIF 06-16-13 EPIC CMET 06-16-14 EPIC CHEST CT 05-15-14 EPIC ECHO 08-26-14 EPIC

## 2014-06-23 NOTE — Patient Instructions (Addendum)
Preparing for your Surgery  Plan for surgery on February 16 with Dr. Denman George.  Pre-operative Testing -You will receive a phone call from presurgical testing at [facility] to arrange for a pre-operative testing appointment before your surgery.  This appointment normally occurs one to two weeks before your scheduled surgery.   -Bring your insurance card, copy of an advanced directive if applicable, medication list  -At that visit, you will be asked to sign a consent for a possible blood transfusion in case a transfusion becomes necessary during surgery.  The need for a blood transfusion is rare but having consent is a necessary part of your care.     -You should not be taking blood thinners or aspirin at least ten days prior to surgery unless instructed by your surgeon.  Day Before Surgery at [place] will be asked to take in only clear liquids the day before surgery.  Examples of clear liquids include broths, jello, and clear juices.  You will need to drink a bottle of magnesium citrate the day before surgery.  You will be advised to have nothing to eat or drink after midnight the evening before.    Your role in recovery Your role is to become active as soon as directed by your doctor, while still giving yourself time to heal.  Rest when you feel tired. You will be asked to do the following in order to speed your recovery:  - Cough and breathe deeply. This helps toclear and expand your lungs and can prevent pneumonia. You may be given a spirometer to practice deep breathing. A staff member will show you how to use the spirometer. - Do mild physical activity. Walking or moving your legs help your circulation and body functions return to normal. A staff member will help you when you try to walk and will provide you with simple exercises. Do not try to get up or walk alone the first time. - Actively manage your pain. Managing your pain lets you move in comfort. We will ask  you to rate your pain on a scale of zero to 10. It is your responsibility to tell your doctor or nurse where and how much you hurt so your pain can be treated.  Special Considerations -If you are diabetic, you may be placed on insulin after surgery to have closer control over your blood sugars to promote healing and recovery.  This does not mean that you will be discharged on insulin.  If applicable, your oral antidiabetics will be resumed when you are tolerating a solid diet.  -Your final pathology results from surgery should be available by the Friday after surgery and the results will be relayed to you when available.  Blood Transfusion Information WHAT IS A BLOOD TRANSFUSION? A transfusion is the replacement of blood or some of its parts. Blood is made up of multiple cells which provide different functions.  Red blood cells carry oxygen and are used for blood loss replacement.  White blood cells fight against infection.  Platelets control bleeding.  Plasma helps clot blood.  Other blood products are available for specialized needs, such as hemophilia or other clotting disorders. BEFORE THE TRANSFUSION  Who gives blood for transfusions?   You may be able to donate blood to be used at a later date on yourself (autologous donation).  Relatives can be asked to donate blood. This is generally not any safer than if you have received blood from a stranger. The same precautions are taken  to ensure safety when a relative's blood is donated.  Healthy volunteers who are fully evaluated to make sure their blood is safe. This is blood bank blood. Transfusion therapy is the safest it has ever been in the practice of medicine. Before blood is taken from a donor, a complete history is taken to make sure that person has no history of diseases nor engages in risky social behavior (examples are intravenous drug use or sexual activity with multiple partners). The donor's travel history is screened to  minimize risk of transmitting infections, such as malaria. The donated blood is tested for signs of infectious diseases, such as HIV and hepatitis. The blood is then tested to be sure it is compatible with you in order to minimize the chance of a transfusion reaction. If you or a relative donates blood, this is often done in anticipation of surgery and is not appropriate for emergency situations. It takes many days to process the donated blood. RISKS AND COMPLICATIONS Although transfusion therapy is very safe and saves many lives, the main dangers of transfusion include:   Getting an infectious disease.  Developing a transfusion reaction. This is an allergic reaction to something in the blood you were given. Every precaution is taken to prevent this. The decision to have a blood transfusion has been considered carefully by your caregiver before blood is given. Blood is not given unless the benefits outweigh the risks.

## 2014-06-23 NOTE — Progress Notes (Signed)
Spoke with Dr. Acie Fredrickson about patient.  Patient has been added to the surgery schedule for Tues., Feb 16 for cystic mass probable ovarian cancer.  She currently is on Coumadin 3-6 mg daily per the Coumadin Clinic at [facility].  Last INR was 1.9 on 06/22/14.  Per Dr. Acie Fredrickson, she is ok for her to stop taking Coumadin now and we can restart post-op day 1 if no concerns about active bleeding, etc.  Patient informed to stop taking her Coumadin today.  She is to go to pre-operative testing today at 2pm.  Advised to call for any questions or concerns.

## 2014-06-23 NOTE — Progress Notes (Signed)
Cbc, cmp, 06/16/14, Chest x ray 12,25 eccho 4/14 epic,  Clearance note from cardiology noted in Micah Flesher NP note dated  06/21/14

## 2014-06-27 ENCOUNTER — Encounter (HOSPITAL_COMMUNITY): Payer: Self-pay | Admitting: Anesthesiology

## 2014-06-27 ENCOUNTER — Inpatient Hospital Stay (HOSPITAL_COMMUNITY)
Admission: RE | Admit: 2014-06-27 | Discharge: 2014-06-30 | DRG: 737 | Disposition: A | Discharge status: Home / Self Care | Payer: BLUE CROSS/BLUE SHIELD | Source: Ambulatory Visit | Attending: Obstetrics & Gynecology | Admitting: Obstetrics & Gynecology

## 2014-06-27 ENCOUNTER — Ambulatory Visit (HOSPITAL_COMMUNITY): Payer: BLUE CROSS/BLUE SHIELD | Admitting: Anesthesiology

## 2014-06-27 ENCOUNTER — Encounter (HOSPITAL_COMMUNITY)
Admission: RE | Disposition: A | Discharge status: Home / Self Care | Payer: Self-pay | Source: Ambulatory Visit | Attending: Obstetrics & Gynecology

## 2014-06-27 DIAGNOSIS — Z9011 Acquired absence of right breast and nipple: Secondary | ICD-10-CM | POA: Diagnosis present

## 2014-06-27 DIAGNOSIS — R945 Abnormal results of liver function studies: Secondary | ICD-10-CM

## 2014-06-27 DIAGNOSIS — E119 Type 2 diabetes mellitus without complications: Secondary | ICD-10-CM | POA: Diagnosis present

## 2014-06-27 DIAGNOSIS — Z01812 Encounter for preprocedural laboratory examination: Secondary | ICD-10-CM

## 2014-06-27 DIAGNOSIS — I4891 Unspecified atrial fibrillation: Secondary | ICD-10-CM | POA: Diagnosis present

## 2014-06-27 DIAGNOSIS — C50911 Malignant neoplasm of unspecified site of right female breast: Secondary | ICD-10-CM

## 2014-06-27 DIAGNOSIS — R197 Diarrhea, unspecified: Secondary | ICD-10-CM | POA: Diagnosis not present

## 2014-06-27 DIAGNOSIS — R791 Abnormal coagulation profile: Secondary | ICD-10-CM | POA: Diagnosis present

## 2014-06-27 DIAGNOSIS — E785 Hyperlipidemia, unspecified: Secondary | ICD-10-CM | POA: Diagnosis present

## 2014-06-27 DIAGNOSIS — R188 Other ascites: Secondary | ICD-10-CM | POA: Diagnosis present

## 2014-06-27 DIAGNOSIS — I10 Essential (primary) hypertension: Secondary | ICD-10-CM | POA: Diagnosis present

## 2014-06-27 DIAGNOSIS — K769 Liver disease, unspecified: Secondary | ICD-10-CM

## 2014-06-27 DIAGNOSIS — G4733 Obstructive sleep apnea (adult) (pediatric): Secondary | ICD-10-CM | POA: Diagnosis present

## 2014-06-27 DIAGNOSIS — R161 Splenomegaly, not elsewhere classified: Secondary | ICD-10-CM

## 2014-06-27 DIAGNOSIS — C562 Malignant neoplasm of left ovary: Secondary | ICD-10-CM | POA: Diagnosis present

## 2014-06-27 DIAGNOSIS — Z7901 Long term (current) use of anticoagulants: Secondary | ICD-10-CM | POA: Diagnosis not present

## 2014-06-27 DIAGNOSIS — K7460 Unspecified cirrhosis of liver: Secondary | ICD-10-CM | POA: Diagnosis present

## 2014-06-27 DIAGNOSIS — K219 Gastro-esophageal reflux disease without esophagitis: Secondary | ICD-10-CM | POA: Diagnosis present

## 2014-06-27 DIAGNOSIS — C772 Secondary and unspecified malignant neoplasm of intra-abdominal lymph nodes: Secondary | ICD-10-CM | POA: Diagnosis present

## 2014-06-27 DIAGNOSIS — R1900 Intra-abdominal and pelvic swelling, mass and lump, unspecified site: Secondary | ICD-10-CM | POA: Diagnosis present

## 2014-06-27 DIAGNOSIS — D509 Iron deficiency anemia, unspecified: Secondary | ICD-10-CM

## 2014-06-27 DIAGNOSIS — Z800 Family history of malignant neoplasm of digestive organs: Secondary | ICD-10-CM | POA: Diagnosis not present

## 2014-06-27 DIAGNOSIS — F329 Major depressive disorder, single episode, unspecified: Secondary | ICD-10-CM | POA: Diagnosis present

## 2014-06-27 DIAGNOSIS — M25552 Pain in left hip: Secondary | ICD-10-CM

## 2014-06-27 DIAGNOSIS — E039 Hypothyroidism, unspecified: Secondary | ICD-10-CM | POA: Diagnosis present

## 2014-06-27 DIAGNOSIS — N134 Hydroureter: Secondary | ICD-10-CM | POA: Diagnosis present

## 2014-06-27 DIAGNOSIS — J189 Pneumonia, unspecified organism: Secondary | ICD-10-CM

## 2014-06-27 DIAGNOSIS — I5032 Chronic diastolic (congestive) heart failure: Secondary | ICD-10-CM

## 2014-06-27 DIAGNOSIS — Z853 Personal history of malignant neoplasm of breast: Secondary | ICD-10-CM

## 2014-06-27 DIAGNOSIS — Z79899 Other long term (current) drug therapy: Secondary | ICD-10-CM

## 2014-06-27 DIAGNOSIS — Z86718 Personal history of other venous thrombosis and embolism: Secondary | ICD-10-CM

## 2014-06-27 DIAGNOSIS — R059 Cough, unspecified: Secondary | ICD-10-CM

## 2014-06-27 DIAGNOSIS — C569 Malignant neoplasm of unspecified ovary: Secondary | ICD-10-CM

## 2014-06-27 DIAGNOSIS — Z87891 Personal history of nicotine dependence: Secondary | ICD-10-CM | POA: Diagnosis not present

## 2014-06-27 DIAGNOSIS — R05 Cough: Secondary | ICD-10-CM

## 2014-06-27 DIAGNOSIS — D696 Thrombocytopenia, unspecified: Secondary | ICD-10-CM

## 2014-06-27 HISTORY — PX: SALPINGOOPHORECTOMY: SHX82

## 2014-06-27 HISTORY — PX: ABDOMINAL HYSTERECTOMY: SHX81

## 2014-06-27 HISTORY — PX: LAPAROTOMY: SHX154

## 2014-06-27 LAB — GLUCOSE, CAPILLARY
GLUCOSE-CAPILLARY: 156 mg/dL — AB (ref 70–99)
Glucose-Capillary: 85 mg/dL (ref 70–99)

## 2014-06-27 LAB — PROTIME-INR
INR: 1.45 (ref 0.00–1.49)
Prothrombin Time: 17.8 seconds — ABNORMAL HIGH (ref 11.6–15.2)

## 2014-06-27 SURGERY — LAPAROTOMY, EXPLORATORY
Anesthesia: General

## 2014-06-27 MED ORDER — KCL IN DEXTROSE-NACL 20-5-0.45 MEQ/L-%-% IV SOLN
INTRAVENOUS | Status: DC
  Administered 2014-06-27: 1000 mL via INTRAVENOUS
  Filled 2014-06-27 (×2): qty 1000

## 2014-06-27 MED ORDER — SODIUM CHLORIDE 0.9 % IV SOLN: 10.0000 mL/h | Freq: Once | INTRAVENOUS | Status: DC

## 2014-06-27 MED ORDER — ACETAMINOPHEN 500 MG PO TABS
1000.0000 mg | ORAL_TABLET | Freq: Four times a day (QID) | ORAL | Status: DC
  Administered 2014-06-27 – 2014-06-30 (×11): 1000 mg via ORAL
  Filled 2014-06-27 (×16): qty 2

## 2014-06-27 MED ORDER — BUPIVACAINE LIPOSOME 1.3 % IJ SUSP
20.0000 mL | Freq: Once | INTRAMUSCULAR | Status: AC
  Administered 2014-06-27: 20 mL
  Filled 2014-06-27: qty 20

## 2014-06-27 MED ORDER — METOPROLOL TARTRATE 25 MG PO TABS
25.0000 mg | ORAL_TABLET | Freq: Two times a day (BID) | ORAL | Status: DC
  Administered 2014-06-27 – 2014-06-30 (×6): 25 mg via ORAL
  Filled 2014-06-27 (×7): qty 1

## 2014-06-27 MED ORDER — SUCCINYLCHOLINE CHLORIDE 20 MG/ML IJ SOLN
INTRAMUSCULAR | Status: DC | PRN
  Administered 2014-06-27: 100 mg via INTRAVENOUS

## 2014-06-27 MED ORDER — MIDAZOLAM HCL 5 MG/5ML IJ SOLN
INTRAMUSCULAR | Status: DC | PRN
  Administered 2014-06-27 (×2): 1 mg via INTRAVENOUS

## 2014-06-27 MED ORDER — PROPOFOL 10 MG/ML IV BOLUS
INTRAVENOUS | Status: AC
  Filled 2014-06-27: qty 20

## 2014-06-27 MED ORDER — HYDROMORPHONE HCL 1 MG/ML IJ SOLN
0.2500 mg | INTRAMUSCULAR | Status: DC | PRN
  Administered 2014-06-27: 0.25 mg via INTRAVENOUS
  Administered 2014-06-27 (×3): 0.5 mg via INTRAVENOUS
  Administered 2014-06-27: 0.25 mg via INTRAVENOUS

## 2014-06-27 MED ORDER — ONDANSETRON HCL 4 MG/2ML IJ SOLN: 4.0000 mg | Freq: Once | INTRAMUSCULAR | Status: DC | PRN

## 2014-06-27 MED ORDER — CISATRACURIUM BESYLATE 20 MG/10ML IV SOLN
INTRAVENOUS | Status: AC
Start: 2014-06-27 — End: 2014-06-27
  Filled 2014-06-27: qty 10

## 2014-06-27 MED ORDER — CEFAZOLIN SODIUM-DEXTROSE 2-3 GM-% IV SOLR
2.0000 g | INTRAVENOUS | Status: AC
  Administered 2014-06-27: 2 g via INTRAVENOUS

## 2014-06-27 MED ORDER — MIDAZOLAM HCL 2 MG/2ML IJ SOLN
INTRAMUSCULAR | Status: AC
  Filled 2014-06-27: qty 2

## 2014-06-27 MED ORDER — ONDANSETRON HCL 4 MG/2ML IJ SOLN
INTRAMUSCULAR | Status: AC
  Filled 2014-06-27: qty 2

## 2014-06-27 MED ORDER — HYDROMORPHONE HCL 1 MG/ML IJ SOLN
INTRAMUSCULAR | Status: AC
  Filled 2014-06-27: qty 1

## 2014-06-27 MED ORDER — PROPOFOL 10 MG/ML IV BOLUS
INTRAVENOUS | Status: DC | PRN
  Administered 2014-06-27: 150 mg via INTRAVENOUS

## 2014-06-27 MED ORDER — NEOSTIGMINE METHYLSULFATE 10 MG/10ML IV SOLN
INTRAVENOUS | Status: DC | PRN
  Administered 2014-06-27: 4 mg via INTRAVENOUS

## 2014-06-27 MED ORDER — GLYCOPYRROLATE 0.2 MG/ML IJ SOLN
INTRAMUSCULAR | Status: DC | PRN
  Administered 2014-06-27: 0.6 mg via INTRAVENOUS

## 2014-06-27 MED ORDER — ENOXAPARIN SODIUM 40 MG/0.4ML ~~LOC~~ SOLN
40.0000 mg | SUBCUTANEOUS | Status: DC
  Administered 2014-06-28 – 2014-06-30 (×3): 40 mg via SUBCUTANEOUS
  Filled 2014-06-27 (×5): qty 0.4

## 2014-06-27 MED ORDER — MAGNESIUM HYDROXIDE 400 MG/5ML PO SUSP
30.0000 mL | Freq: Three times a day (TID) | ORAL | Status: AC
  Administered 2014-06-27 – 2014-06-28 (×3): 30 mL via ORAL
  Filled 2014-06-27 (×3): qty 30

## 2014-06-27 MED ORDER — HYDROMORPHONE HCL 1 MG/ML IJ SOLN
0.5000 mg | INTRAMUSCULAR | Status: DC | PRN
  Administered 2014-06-27: 0.5 mg via INTRAVENOUS
  Filled 2014-06-27: qty 1

## 2014-06-27 MED ORDER — ONDANSETRON HCL 4 MG/2ML IJ SOLN: 4.0000 mg | Freq: Four times a day (QID) | INTRAMUSCULAR | Status: DC | PRN

## 2014-06-27 MED ORDER — FENTANYL CITRATE 0.05 MG/ML IJ SOLN
INTRAMUSCULAR | Status: DC | PRN
  Administered 2014-06-27 (×3): 50 ug via INTRAVENOUS

## 2014-06-27 MED ORDER — CISATRACURIUM BESYLATE (PF) 10 MG/5ML IV SOLN
INTRAVENOUS | Status: DC | PRN
  Administered 2014-06-27: 4 mg via INTRAVENOUS
  Administered 2014-06-27 (×2): 2 mg via INTRAVENOUS
  Administered 2014-06-27: 6 mg via INTRAVENOUS
  Administered 2014-06-27: 2 mg via INTRAVENOUS
  Administered 2014-06-27 (×2): 4 mg via INTRAVENOUS

## 2014-06-27 MED ORDER — ENSURE COMPLETE PO LIQD: 237.0000 mL | Freq: Two times a day (BID) | ORAL | Status: DC

## 2014-06-27 MED ORDER — TRAMADOL HCL 50 MG PO TABS
100.0000 mg | ORAL_TABLET | Freq: Two times a day (BID) | ORAL | Status: DC
  Administered 2014-06-27 – 2014-06-30 (×6): 100 mg via ORAL
  Filled 2014-06-27 (×6): qty 2

## 2014-06-27 MED ORDER — BUPROPION HCL 75 MG PO TABS
75.0000 mg | ORAL_TABLET | Freq: Two times a day (BID) | ORAL | Status: DC
  Administered 2014-06-27 – 2014-06-30 (×6): 75 mg via ORAL
  Filled 2014-06-27 (×7): qty 1

## 2014-06-27 MED ORDER — CEFAZOLIN SODIUM-DEXTROSE 2-3 GM-% IV SOLR
INTRAVENOUS | Status: AC
  Filled 2014-06-27: qty 50

## 2014-06-27 MED ORDER — DEXAMETHASONE SODIUM PHOSPHATE 10 MG/ML IJ SOLN
INTRAMUSCULAR | Status: DC | PRN
  Administered 2014-06-27: 10 mg via INTRAVENOUS

## 2014-06-27 MED ORDER — ONDANSETRON HCL 4 MG PO TABS: 4.0000 mg | ORAL_TABLET | Freq: Four times a day (QID) | ORAL | Status: DC | PRN

## 2014-06-27 MED ORDER — LEVOTHYROXINE SODIUM 150 MCG PO TABS
150.0000 ug | ORAL_TABLET | Freq: Every day | ORAL | Status: DC
  Administered 2014-06-28 – 2014-06-30 (×3): 150 ug via ORAL
  Filled 2014-06-27 (×5): qty 1

## 2014-06-27 MED ORDER — FENTANYL CITRATE 0.05 MG/ML IJ SOLN
INTRAMUSCULAR | Status: AC
Start: 2014-06-27 — End: 2014-06-27
  Filled 2014-06-27: qty 5

## 2014-06-27 MED ORDER — SODIUM CHLORIDE 0.9 % IJ SOLN
INTRAMUSCULAR | Status: AC
  Filled 2014-06-27: qty 20

## 2014-06-27 MED ORDER — INSULIN ASPART 100 UNIT/ML ~~LOC~~ SOLN
0.0000 [IU] | Freq: Three times a day (TID) | SUBCUTANEOUS | Status: DC
  Administered 2014-06-28 (×2): 2 [IU] via SUBCUTANEOUS

## 2014-06-27 MED ORDER — CLONAZEPAM 0.5 MG PO TABS
0.2500 mg | ORAL_TABLET | Freq: Two times a day (BID) | ORAL | Status: DC
  Administered 2014-06-27 – 2014-06-29 (×5): 0.25 mg via ORAL
  Administered 2014-06-30: 2.5 mg via ORAL
  Filled 2014-06-27 (×6): qty 1

## 2014-06-27 MED ORDER — 0.9 % SODIUM CHLORIDE (POUR BTL) OPTIME
TOPICAL | Status: DC | PRN
  Administered 2014-06-27: 3000 mL

## 2014-06-27 MED ORDER — SODIUM CHLORIDE 0.9 % IJ SOLN
INTRAMUSCULAR | Status: DC | PRN
  Administered 2014-06-27: 20 mL

## 2014-06-27 MED ORDER — ESCITALOPRAM OXALATE 20 MG PO TABS
20.0000 mg | ORAL_TABLET | Freq: Every day | ORAL | Status: DC
  Administered 2014-06-27 – 2014-06-30 (×4): 20 mg via ORAL
  Filled 2014-06-27 (×4): qty 1

## 2014-06-27 MED ORDER — SODIUM CHLORIDE 0.9 % IV SOLN
Freq: Once | INTRAVENOUS | Status: AC
  Administered 2014-06-27: 16:00:00 via INTRAVENOUS

## 2014-06-27 MED ORDER — CISATRACURIUM BESYLATE 20 MG/10ML IV SOLN
INTRAVENOUS | Status: AC
  Filled 2014-06-27: qty 10

## 2014-06-27 MED ORDER — LACTATED RINGERS IV SOLN
INTRAVENOUS | Status: DC
  Administered 2014-06-27: 1000 mL via INTRAVENOUS
  Administered 2014-06-27 (×2): via INTRAVENOUS

## 2014-06-27 MED ORDER — GLYCOPYRROLATE 0.2 MG/ML IJ SOLN
INTRAMUSCULAR | Status: AC
  Filled 2014-06-27: qty 2

## 2014-06-27 MED ORDER — PANTOPRAZOLE SODIUM 40 MG PO TBEC
40.0000 mg | DELAYED_RELEASE_TABLET | Freq: Every day | ORAL | Status: DC
Start: 2014-06-28 — End: 2014-06-30
  Administered 2014-06-28 – 2014-06-30 (×3): 40 mg via ORAL
  Filled 2014-06-27 (×3): qty 1

## 2014-06-27 MED ORDER — DEXAMETHASONE SODIUM PHOSPHATE 10 MG/ML IJ SOLN
INTRAMUSCULAR | Status: AC
  Filled 2014-06-27: qty 1

## 2014-06-27 MED ORDER — OXYCODONE HCL 5 MG PO TABS
5.0000 mg | ORAL_TABLET | ORAL | Status: DC | PRN
  Administered 2014-06-28 – 2014-06-29 (×4): 5 mg via ORAL
  Filled 2014-06-27 (×4): qty 1

## 2014-06-27 MED ORDER — VITAMIN K1 10 MG/ML IJ SOLN
2.5000 mg | INTRAVENOUS | Status: AC
  Administered 2014-06-27: 2.5 mg via INTRAVENOUS
  Filled 2014-06-27: qty 0.25

## 2014-06-27 SURGICAL SUPPLY — 48 items
ATTRACTOMAT 16X20 MAGNETIC DRP (DRAPES) ×3 IMPLANT
BLADE EXTENDED COATED 6.5IN (ELECTRODE) ×3 IMPLANT
CHLORAPREP W/TINT 26ML (MISCELLANEOUS) ×3 IMPLANT
CLIP TI MEDIUM 6 (CLIP) ×2 IMPLANT
CLIP TI MEDIUM LARGE 6 (CLIP) ×6 IMPLANT
CONT SPEC 4OZ CLIKSEAL STRL BL (MISCELLANEOUS) ×3 IMPLANT
COVER SURGICAL LIGHT HANDLE (MISCELLANEOUS) ×3 IMPLANT
DRAPE INCISE IOBAN 66X45 STRL (DRAPES) ×3 IMPLANT
DRAPE UTILITY 15X26 (DRAPE) ×3 IMPLANT
DRAPE WARM FLUID 44X44 (DRAPE) ×3 IMPLANT
DRESSING TELFA ISLAND 4X8 (GAUZE/BANDAGES/DRESSINGS) ×3 IMPLANT
DRSG OPSITE POSTOP 4X12 (GAUZE/BANDAGES/DRESSINGS) ×1 IMPLANT
ELECT LIGASURE SHORT 9 REUSE (ELECTRODE) IMPLANT
ELECT REM PT RETURN 9FT ADLT (ELECTROSURGICAL) ×3
ELECTRODE REM PT RTRN 9FT ADLT (ELECTROSURGICAL) ×2 IMPLANT
FLOSEAL 10ML (HEMOSTASIS) ×1 IMPLANT
GAUZE SPONGE 4X4 12PLY STRL (GAUZE/BANDAGES/DRESSINGS) ×2 IMPLANT
GAUZE SPONGE 4X4 16PLY XRAY LF (GAUZE/BANDAGES/DRESSINGS) IMPLANT
GLOVE BIO SURGEON STRL SZ 6 (GLOVE) ×6 IMPLANT
GLOVE BIO SURGEON STRL SZ 6.5 (GLOVE) ×6 IMPLANT
GLOVE BIOGEL M STRL SZ7.5 (GLOVE) ×6 IMPLANT
GOWN STRL REUS W/ TWL LRG LVL3 (GOWN DISPOSABLE) ×4 IMPLANT
GOWN STRL REUS W/TWL LRG LVL3 (GOWN DISPOSABLE) ×6
KIT BASIN OR (CUSTOM PROCEDURE TRAY) ×3 IMPLANT
LIGASURE IMPACT 36 18CM CVD LR (INSTRUMENTS) ×1 IMPLANT
LIQUID BAND (GAUZE/BANDAGES/DRESSINGS) IMPLANT
LOOP VESSEL MAXI BLUE (MISCELLANEOUS) ×1 IMPLANT
NEEDLE HYPO 22GX1.5 SAFETY (NEEDLE) ×6 IMPLANT
NS IRRIG 1000ML POUR BTL (IV SOLUTION) ×8 IMPLANT
PACK GENERAL/GYN (CUSTOM PROCEDURE TRAY) ×3 IMPLANT
SHEET LAVH (DRAPES) ×3 IMPLANT
SPONGE LAP 18X18 X RAY DECT (DISPOSABLE) ×1 IMPLANT
STAPLER VISISTAT 35W (STAPLE) ×3 IMPLANT
SUT MNCRL AB 4-0 PS2 18 (SUTURE) IMPLANT
SUT PDS AB 1 TP1 96 (SUTURE) ×6 IMPLANT
SUT VIC AB 0 CT1 36 (SUTURE) ×9 IMPLANT
SUT VIC AB 2-0 CT1 36 (SUTURE) ×6 IMPLANT
SUT VIC AB 2-0 CT2 27 (SUTURE) ×20 IMPLANT
SUT VIC AB 2-0 SH 27 (SUTURE) ×6
SUT VIC AB 2-0 SH 27X BRD (SUTURE) ×4 IMPLANT
SUT VIC AB 3-0 CTX 36 (SUTURE) IMPLANT
SUT VICRYL 2 0 18  UND BR (SUTURE) ×1
SUT VICRYL 2 0 18 UND BR (SUTURE) ×2 IMPLANT
SYR 20CC LL (SYRINGE) ×6 IMPLANT
TOWEL OR 17X26 10 PK STRL BLUE (TOWEL DISPOSABLE) ×6 IMPLANT
TOWEL OR NON WOVEN STRL DISP B (DISPOSABLE) ×3 IMPLANT
TRAY FOLEY CATH 14FRSI W/METER (CATHETERS) ×3 IMPLANT
WATER STERILE IRR 1500ML POUR (IV SOLUTION) ×2 IMPLANT

## 2014-06-27 NOTE — Progress Notes (Signed)
RT placed pt on auto titrate CPAP 5-20cmH2O via nasal mask. Pt is wearing ETCO2 nasal cannula under nasal mask with 2Lpm of oxygen bled in. Pt states she is comfortable and appears to be tolerating CPAP well at this time. RT will continue to monitor as needed.

## 2014-06-27 NOTE — Interval H&P Note (Signed)
History and Physical Interval Note:  06/27/2014 1:50 PM  [name]  has presented today for surgery, with the diagnosis of pelvic mass  The various methods of treatment have been discussed with the patient and family. After consideration of risks, benefits and other options for treatment, the patient has consented to  Procedure(s): EXPLORATORY LAPAROTOMY (N/A) HYSTERECTOMY ABDOMINAL TOTAL (N/A) BILATERAL SALPINGO OOPHORECTOMY/TUMOR DEBULKING/LYMPHNODE DISSECTION (Bilateral) DEBULKING (N/A) as a surgical intervention .  The patient's history has been reviewed, patient examined, no change in status, stable for surgery.  I have reviewed the patient's chart and labs.  Questions were answered to the patient's satisfaction.     [name]

## 2014-06-27 NOTE — Transfer of Care (Signed)
Immediate Anesthesia Transfer of Care Note  Patient: [name]  Procedure(s) Performed: Procedure(s): EXPLORATORY LAPAROTOMY (N/A) HYSTERECTOMY ABDOMINAL TOTAL (N/A) BILATERAL SALPINGO OOPHORECTOMY/TUMOR DEBULKING/LYMPHNODE DISSECTION, OMENTECTOMY (Bilateral)  Patient Location: PACU  Anesthesia Type:General  Level of Consciousness: awake, alert  and oriented  Airway & Oxygen Therapy: Patient Spontanous Breathing and Patient connected to face mask oxygen  Post-op Assessment: Report given to RN and Post -op Vital signs reviewed and stable  Post vital signs: Reviewed and stable  Last Vitals:  Filed Vitals:   06/27/14 1033  BP: 131/64  Pulse: 71  Temp: 36.6 C  Resp: 18    Complications: No apparent anesthesia complications

## 2014-06-27 NOTE — H&P (View-Only) (Signed)
Consult Note: Gyn-Onc   [name] 59 y.o. female  Chief Complaint  Patient presents with  . New Patient    Mass on ovary    Assessment : Large pelvic mass and retroperitoneal adenopathy with increased uptake on PET scan. Elevated CA-125 most consistent with ovarian cancer.  Plan: I recommend the patient undergo initial debulking surgery and this is scheduled for Tuesday, February 16.  I lengthy discussion with the patient and her husband regarding the imaging and laboratory findings and recommended she undergo surgical staging and debulking. They are eager to proceed as quickly as possible. Risks of surgery were reviewed area the patient understands that she is at increased risk due to her multiple medical problems. Given her past history of DVT, we will initiate Lovenox to have a prophylactic dose administered just prior to surgery.  Given the patient's use of warfarin for her atrial fibrillation we contact her cardiologist who is in agreement to discontinue the warfarin and does not recommend a bridge.  Patient understands that surgery will include total bowel hysterectomy bilateral salpingo-oophorectomy omentectomy and attempt at resection of the left periaortic lymph nodes. They also understand that postoperatively she will likely need adjuvant chemotherapy. Further, they're aware that this may be metastatic breast cancer rather than a primary ovarian cancer. All other questions are answered and we will expedite having the patient seen and [facility].  HPI: 59 year old white female seen in consultation request of Dr. Jana Hakim regarding management of a newly diagnosed pelvic mass. Patient has a past history of breast cancer in January 2014 and is been under the care of Dr. Jana Hakim. As part of the workup for pulmonary nodules she underwent a PET scan on 06/05/2014 which showed no evidence of hypermetabolic uptake in the lungs however a 13.7 x 11.7 cm pelvic mass with hypermetabolic nodules  was discovered along with left external iliac and periaortic adenopathy. CA-125 value was 71 units per mL. The patient notes some abdominal discomfort and bloating.  She has no other past gynecologic history and there is no family history of gynecologic breast or colon cancer.  Review of Systems:10 point review of systems is negative except as noted in interval history.   Vitals: There were no vitals taken for this visit.  Physical Exam: General : The patient is a healthy woman in no acute distress.  HEENT: normocephalic, extraoccular movements normal; neck is supple without thyromegally  Lynphnodes: Supraclavicular and inguinal nodes not enlarged  Abdomen: Soft, non-tender, slightly distended, no ascites, no organomegally, no masses, no hernias  Pelvic:  EGBUS: Normal female  Vagina: Normal, no lesions  Urethra and Bladder: Normal, non-tender  Cervix: Normal  Uterus: Difficult to assess given the patient's habitus. There is fullness throughout the pelvis. Bi-manual examination: Non-tender; no adenxal masses or nodularity  Rectal: normal sphincter tone, no masses, no blood  Lower extremities: No edema or varicosities. Normal range of motion      Allergies  Allergen Reactions  . Olmesartan Medoxomil Cough    REACTION: ? if cough  . Tetracycline Hcl     Unknown reaction, too long for patient to remember   . Venlafaxine     REACTION: severe dry moouth  . Adhesive [Tape] Rash    Past Medical History  Diagnosis Date  . Depression   . DVT (deep venous thrombosis)     hx of on HRT left leg ~2006  . GERD (gastroesophageal reflux disease)   . Hyperlipidemia   . Hypertension   . Hypothyroidism   .  PPD positive, treated     rx inh   . OSA on CPAP   . Diabetes mellitus   . 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 Breast cancer     a. 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 DJD (degenerative joint disease) of lumbar spine   . Chronic diastolic CHF (congestive heart failure)     a. 08/2012 Echo: EF 55-60%, no rwma, mod MR.  [name] A-fib     a. on amio for rate control/coumadin.  [name] Physical deconditioning   . Allergy   . Anemia     low iron hx  . Blood transfusion without reported diagnosis     2 u prbc  . Cataract     removed ou    Past Surgical History  Procedure Laterality Date  . Tubal ligation    . Cholecystectomy    . Foot surgery    . Eye surgery    . Cataract extraction    . Percutaneous liver biopsy    . Breast biopsy      left breast  . Mastectomy modified radical  06/08/2012    Procedure: MASTECTOMY MODIFIED RADICAL;  Surgeon: Edward Jolly, MD;  Location: [facility];  Service: General;  Laterality: Right;  . Portacath placement  06/08/2012    Procedure: INSERTION PORT-A-CATH;  Surgeon: Edward Jolly, MD;  Location: [facility];  Service: General;  Laterality: Left;  . Peg tube placement      Current Outpatient Prescriptions  Medication Sig Dispense Refill  . buPROPion (WELLBUTRIN) 75 MG tablet Take 1 tablet (75 mg total) by mouth 2 (two) times daily. 14 tablet 0  . clonazePAM (KLONOPIN) 0.5 MG tablet Take 0.5 tablets (0.25 mg total) by mouth 2 (two) times daily. 30 tablet 0  . escitalopram (LEXAPRO) 20 MG tablet Take 1 tablet (20 mg total) by mouth daily. 90 tablet 1  . furosemide (LASIX) 20 MG tablet Take 1 tablet (20 mg total) by mouth daily. 90 tablet 1  . glimepiride (AMARYL) 2 MG tablet   1  . HYDROcodone-homatropine (HYCODAN) 5-1.5 MG/5ML syrup Take 5 mLs by mouth every 8 (eight) hours as needed for cough. 120 mL 0  . letrozole (FEMARA) 2.5 MG tablet Take 1 tablet (2.5 mg total) by mouth daily. 90 tablet 0  . levothyroxine (SYNTHROID, LEVOTHROID) 150 MCG tablet Take 1 tablet (150 mcg total) by mouth daily. 90 tablet 1  . metFORMIN (GLUCOPHAGE) 500 MG tablet Take 1 tablet (500 mg total) by mouth 2 (two) times  daily with a meal. 180 tablet 1  . metoprolol tartrate (LOPRESSOR) 25 MG tablet Take 1 tablet (25 mg total) by mouth 2 (two) times daily. 180 tablet 1  . OLANZapine (ZYPREXA) 2.5 MG tablet TAKE 1 TABLET NIGHTLY AT BEDTIME AS NEEDED FOR SLEEP OR anxiety 90 tablet 1  . omeprazole (PRILOSEC) 20 MG capsule 1 tab po bid 60 capsule 1  . potassium chloride (K-DUR,KLOR-CON) 10 MEQ tablet Take 10 mEq by mouth 2 (two) times daily.  1  . spironolactone (ALDACTONE) 25 MG tablet Take 1 tablet (25 mg total) by mouth daily. 90 tablet 1  . warfarin (COUMADIN) 3 MG tablet Take 1-2 tablets (3-6 mg total) by mouth daily. as directed by coumadin clinic 135 tablet 1  . potassium chloride (K-DUR) 10 MEQ tablet Take 10 mEq by mouth 2 (  two) times daily.  3   No current facility-administered medications for this visit.    History   Social History  . Marital Status: Married    Spouse Name: N/A  . Number of Children: 2  . Years of Education: N/A   Occupational History  . [place] History Main Topics  . Smoking status: Former Smoker -- 1.00 packs/day for 1 years  . Smokeless tobacco: Never Used  . Alcohol Use: No  . Drug Use: No     Comment: quit age 17 only smoked as a teen 1 year  . Sexual Activity: No   Other Topics Concern  . 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     Family History  Problem Relation Age of Onset  . Diabetes Mother   . Hypertension Mother   . Arthritis Mother   . Heart disease Mother   . Heart failure Mother   . Other Mother     benign breast mass  . Stroke Father   . Heart disease Father   . Diabetes Paternal Grandmother   . Colon cancer Paternal Lindajo Royal, MD 06/23/2014, 9:38 AM

## 2014-06-27 NOTE — Anesthesia Preprocedure Evaluation (Addendum)
Anesthesia Evaluation  Patient identified by MRN, date of birth, ID band Patient awake    Reviewed: Allergy & Precautions, NPO status , Patient's Chart, lab work & pertinent test results  Airway        Dental   Pulmonary sleep apnea , former smoker,          Cardiovascular hypertension, +CHF     Neuro/Psych Depression    GI/Hepatic GERD-  ,(+) Cirrhosis -      ,   Endo/Other  diabetes, Type 2, Oral Hypoglycemic AgentsHypothyroidism   Renal/GU      Musculoskeletal  (+) Arthritis -,   Abdominal   Peds  Hematology  (+) anemia ,   Anesthesia Other Findings   Reproductive/Obstetrics                           Anesthesia Physical Anesthesia Plan  ASA: III  Anesthesia Plan: General   Post-op Pain Management:    Induction: Intravenous  Airway Management Planned: Oral ETT  Additional Equipment:   Intra-op Plan:   Post-operative Plan: Extubation in OR  Informed Consent: I have reviewed the patients History and Physical, chart, labs and discussed the procedure including the risks, benefits and alternatives for the proposed anesthesia with the patient or authorized representative who has indicated his/her understanding and acceptance.     Plan Discussed with: CRNA, Anesthesiologist and Surgeon  Anesthesia Plan Comments:         Anesthesia Quick Evaluation

## 2014-06-27 NOTE — Procedures (Signed)
Pt nasal mask changed to pt's own nasal pillows per request.  Pt does not have EtCO2 [place] on.  4L of O2 added to machine. Pt resting comfortably at this time.

## 2014-06-27 NOTE — Anesthesia Postprocedure Evaluation (Signed)
  Anesthesia Post-op Note  Patient: [name]  Procedure(s) Performed: Procedure(s): EXPLORATORY LAPAROTOMY (N/A) HYSTERECTOMY ABDOMINAL TOTAL (N/A) BILATERAL SALPINGO OOPHORECTOMY/TUMOR DEBULKING/LYMPHNODE DISSECTION, OMENTECTOMY (Bilateral)  Patient Location: PACU  Anesthesia Type:General  Level of Consciousness: awake, alert , oriented and patient cooperative  Airway and Oxygen Therapy: Patient Spontanous Breathing  Post-op Pain: mild  Post-op Assessment: Post-op Vital signs reviewed, Patient's Cardiovascular Status Stable, Respiratory Function Stable, Patent Airway, No signs of Nausea or vomiting and Pain level controlled  Post-op Vital Signs: stable  Last Vitals:  Filed Vitals:   06/27/14 1800  BP: 146/62  Pulse: 85  Temp:   Resp: 19    Complications: No apparent anesthesia complications

## 2014-06-27 NOTE — Anesthesia Procedure Notes (Signed)
Procedure Name: Intubation Date/Time: 06/27/2014 2:06 PM Performed by: Dione Booze Pre-anesthesia Checklist: Patient identified, Emergency Drugs available, Suction available and Patient being monitored Patient Re-evaluated:Patient Re-evaluated prior to inductionOxygen Delivery Method: Circle system utilized Preoxygenation: Pre-oxygenation with 100% oxygen Intubation Type: IV induction Grade View: Grade III Tube type: Oral Tube size: 7.5 mm Number of attempts: 1 Airway Equipment and Method: Video-laryngoscopy (known difficult intubation) Placement Confirmation: ETT inserted through vocal cords under direct vision and CO2 detector Secured at: 21 cm Tube secured with: Tape Dental Injury: Teeth and Oropharynx as per pre-operative assessment  Difficulty Due To: Difficulty was anticipated, Difficult Airway- due to limited oral opening and Difficult Airway- due to anterior larynx

## 2014-06-27 NOTE — Op Note (Signed)
Preoperative Diagnosis: pelvic mass, enlarged lymph nodes, elevated CA 125   Postoperative Diagnosis: clinical stage IIIC ovarian cancer     Procedure(s) Performed: Exploratory laparotomy with total abdominal hysterectomy bilateral salpingo-oophorectomy, para-aortic lymphadenectomy, omentectomy radical tumor debulking for ovarian cancer .  Surgeon: Thereasa Solo, MD.  Assistant Surgeon: Lahoma Crocker, M.D. Assistant: (an MD assistant was necessary for tissue manipulation, retraction and positioning due to the complexity of the case and hospital policies).   Specimens: Uterus, Bilateral tubes / ovaries, omentum, left common iliac and left para-aortic lymph nodes, ascites.    Estimated Blood Loss: 1000 mL.    Blood Products: 2 units FFP, 2 units PRBC  Urine [id number]  Complications: None.   Operative Findings: INR 1.45, oozing from surgical sites consistent with iatrogenic coagulopathy, 8cm left ovarian mass with retroperitoneal infiltration/fibrosis causing left hydroureter. Normal appearing right tube and ovary. 6 week size uterus. Grossly normal appearing omentum. No other apparent intraperitoneal tumor. Small volume ascites. Palpably enlarged para-aortic (left) lymph nodes. No residual palpable or visible disease at completion of case.   This represented an optimal cytoreduction (R0) with no gross visible disease remaining.   Procedure:   The patient was seen in the Holding Room. The risks, benefits, complications, treatment options, and expected outcomes were discussed with the patient.  The patient concurred with the proposed plan, giving informed consent.   The patient was  identified as [name]  and the procedure verified as BSO, omentectomy, tumor debulking. A Time Out was held and the above information confirmed upon entry to the operating room..  After induction of anesthesia, the patient was draped and prepped in the usual sterile manner.  She was prepped and draped in  the normal sterile fashion in the dorsal lithotomy position in padded Allen stirrups with good attention paid to support of the lower back and lower extremities. Position was adjusted for appropriate support. A Foley catheter was placed to gravity.   A midline vertical incision was made and carried through the subcutaneous tissue to the fascia. The fascial incision was made and extended superiorally. The rectus muscles were separated. The peritoneum was identified and entered. Peritoneal incision was extended longitudinally.  The abdominal cavity was entered sharply and without incident. A Bookwalter retractor was then placed. A survey of the abdomen and pelvis revealed the above findings, which were significant for the 8cm left ovarian mass densely adherent to the retroperitoneum and rectum.   After packing the small bowel into the upper abdomen, we performed a right salpingo-oophorectomy by entering the  pelvic sidewall just posterior to the right round ligament. The pararectal space was developed and the retroperitoneum developed up to the level of the common iliac artery.  The course of the ureter was identified with ease. The right IP was then skeletonized, and transected with ligasure. The ovary was separated from its peritoneal attachments with the bovie with visualization of the ureter at all times. The uterine arteries were skeletonized at the isthmus. The bladder was taken down off the cervico vaginal junction with monopolar dissection.   The sigmoid colon was dissected from its dense tumor attachments to the left ovary using sharp dissection. The left retroperitoneal peritoneum was entered parallel to the sigmoid colon attachments and the left ureter was identified in the left retroperitoneal space. It was enlarged with hydroureter as it demonstrated obstruction from the ovarian mass/retroperitoneal infiltration at the level of the uterin artery. Meticulous left ureterolysis took place. The ureter  was skeletonized and a vessel  loop was placed around it. Using sharp dissection the ureter was untunneled from the mass, and the uterine artery on the left was skeletonized, identified and clipped at the point that it crossed over the left ureter. Bleeding in the left retroperitoneum was controlled with heme clips. The uterine arteries were bilaterally clamped at the isthmus with curved masterson clamps. The uterine vessel pedicles were transected and suture ligated. The cardinal ligaments were clamped with straight clamps, transected and made hemostatic with 2-0 vicry suture. The curved clamps were placed across the proximal vagina at the cervico vaginal junction and the uterine specimen was transected. The vaginal cuff was closed with 0-vicryl figure of eight sutures with care to avoid the bladder flap. Hemostasis was confirmed and reinforced with 2-0 vicryl on cardinal ligament bleeders and heme clips placed. The frozen section of the ovary revealed likely clear cell carcinoma, favor ovarian primary.  The omentum was dissected free from the transverse colon from the hepatic flexure to the splenic flexure using sharp metzenbaum scissor dissection. The lesser sac was entered. The omentum was separated from the mesentery of the transverse colon. The short gastric vessels were sealed with ligasure and the infragastric omentum was separated from the greater curvature of the stomach. Hemostasis was confirmed. The colon was closely inspected and was noted to be intact and hemostatic.  The left retroperitoneal space was approached laterally by extending the incision on the peritoneum of the left paracolic gutter to mobilize the colon and left kidney medially. The left para-aortic nodes were palpated. There were some slightly bulky nodes on the left common iliac region. These were resected using monopoloar with care to retract the ureter medially and to ensure a hemostatic dissection. The left para-aortic nodes were  also approached laterally from a retroperitoneal approach and all palpably enlarged nodes were removed. Due to patient adiposity/obesity, visualization of the upper most para-aortic nodes was not possible, however, no palpably bulky tissues were appreciated above the surgical dissection site.   The peritoneal cavity was irrigated and hemostasis was confirmed at all surgical sites. Floseal was placed in the left pelvic retroperitoneum and left para-aortic nodal basins to reinforce hemostasis.  No visible or palpable residual tumor was present at the completion of the surgery.   The fascia was reapproximated with 0 looped PDS using a total of two sutures. The subcutaneous layer was then irrigated copiously.  Exparel long acting local anesthetic was infiltrated into the subcutaneous tissues. The subcutaneous fat was reapproximated with 2-0 vicryl. The skin was closed with staples. The patient tolerated the procedure well.   Sponge, lap and needle counts were correct x 2.   [name], MD

## 2014-06-28 ENCOUNTER — Encounter (HOSPITAL_COMMUNITY): Payer: Self-pay | Admitting: Gynecologic Oncology

## 2014-06-28 LAB — COMPREHENSIVE METABOLIC PANEL
ALK PHOS: 77 U/L (ref 39–117)
ALT: 32 U/L (ref 0–35)
AST: 59 U/L — ABNORMAL HIGH (ref 0–37)
Albumin: 3 g/dL — ABNORMAL LOW (ref 3.5–5.2)
Anion gap: 10 (ref 5–15)
BUN: 10 mg/dL (ref 6–23)
CO2: 24 mmol/L (ref 19–32)
Calcium: 8.2 mg/dL — ABNORMAL LOW (ref 8.4–10.5)
Chloride: 104 mmol/L (ref 96–112)
Creatinine, Ser: 0.57 mg/dL (ref 0.50–1.10)
GFR calc Af Amer: >90 mL/min (ref >90)
GFR calc non Af Amer: >90 mL/min (ref >90)
GLUCOSE: 194 mg/dL — AB (ref 70–99)
POTASSIUM: 4.3 mmol/L (ref 3.5–5.1)
SODIUM: 138 mmol/L (ref 135–145)
TOTAL PROTEIN: 6.1 g/dL (ref 6.0–8.3)
Total Bilirubin: 1.3 mg/dL — ABNORMAL HIGH (ref 0.3–1.2)

## 2014-06-28 LAB — PREPARE FRESH FROZEN PLASMA
Unit division: 0
Unit division: 0

## 2014-06-28 LAB — CBC
HCT: 36.3 % (ref 36.0–46.0)
Hemoglobin: 12 g/dL (ref 12.0–15.0)
MCH: 30.6 pg (ref 26.0–34.0)
MCHC: 33.1 g/dL (ref 30.0–36.0)
MCV: 92.6 fL (ref 78.0–100.0)
Platelets: 128 10*3/uL — ABNORMAL LOW (ref 150–400)
RBC: 3.92 MIL/uL (ref 3.87–5.11)
RDW: 14.4 % (ref 11.5–15.5)
WBC: 8.7 10*3/uL (ref 4.0–10.5)

## 2014-06-28 LAB — GLUCOSE, CAPILLARY
GLUCOSE-CAPILLARY: 113 mg/dL — AB (ref 70–99)
Glucose-Capillary: 148 mg/dL — ABNORMAL HIGH (ref 70–99)
Glucose-Capillary: 138 mg/dL — ABNORMAL HIGH (ref 70–99)
Glucose-Capillary: 100 mg/dL — ABNORMAL HIGH (ref 70–99)

## 2014-06-28 LAB — TYPE AND SCREEN
ABO/RH(D): O POS
Antibody Screen: NEGATIVE
UNIT DIVISION: 0
Unit division: 0

## 2014-06-28 MED ORDER — HYDROMORPHONE HCL 1 MG/ML IJ SOLN: 0.5000 mg | INTRAMUSCULAR | Status: DC | PRN

## 2014-06-28 MED ORDER — ENOXAPARIN (LOVENOX) PATIENT EDUCATION KIT
PACK | Freq: Once | Status: AC
  Administered 2014-06-28: 12:00:00
  Filled 2014-06-28: qty 1

## 2014-06-28 NOTE — Progress Notes (Signed)
1 Day Post-Op Procedure(s) (LRB): EXPLORATORY LAPAROTOMY (N/A) HYSTERECTOMY ABDOMINAL TOTAL (N/A) BILATERAL SALPINGO OOPHORECTOMY/TUMOR DEBULKING/LYMPHNODE DISSECTION, OMENTECTOMY (Bilateral)  Subjective: Patient reports some pain in right side of incision, burping/belching last night, no flatus.    Objective: Vital signs in last 24 hours: Temp:  [97.8 F (36.6 C)-99 F (37.2 C)] 98.2 F (36.8 C) (02/17 0453) Pulse Rate:  [71-90] 75 (02/17 0453) Resp:  [12-31] 16 (02/17 0453) BP: (114-169)/(54-73) 114/54 mmHg (02/17 0453) SpO2:  [92 %-100 %] 98 % (02/17 0453) Weight:  [201 lb 6 oz (91.343 kg)] 201 lb 6 oz (91.343 kg) (02/16 1119)    Intake/Output from previous day: 02/16 0701 - 02/17 0700 In: 4949 [P.O.:300; I.V.:3345; Blood:1254; IV Piggyback:50] Out: 1800 [Urine:800; Blood:1000]  Physical Examination: General: alert and cooperative Resp: clear to auscultation bilaterally Cardio: regular rate and rhythm, S1, S2 normal, no murmur, click, rub or gallop GI: soft, non-tender; bowel sounds normal; no masses,  no organomegaly and incision: clean, dry and intact Extremities: extremities normal, atraumatic, no cyanosis or edema Vaginal Bleeding: none  Labs: WBC/Hgb/Hct/Plts:  8.7/12.0/36.3/128 (02/17 0525) BUN/Cr/glu/ALT/AST/amyl/lip:  10/0.57/--/32/59/--/-- (02/17 0525)   Assessment:  59 y.o. s/p Procedure(s): EXPLORATORY LAPAROTOMY HYSTERECTOMY ABDOMINAL TOTAL BILATERAL SALPINGO OOPHORECTOMY/TUMOR DEBULKING/LYMPHNODE DISSECTION, OMENTECTOMY: stable Pain:  Pain is well-controlled on oral medications.  Heme:s/p transfusion 2 units intraop. Appropriate Hb for postop today (12). No signs of ongoing bleeding. Iatrogenic coagulopathy intraop (INR 1.45 after recent coumadin use). Continue to hold coumadin until discharge.  ID: no issues  CV: HTN -adequately controlled on meds  controlled.  GI:  Tolerating po: Yes   Continue regular diet as tolerated. Warned patient to go  "easy" with food until passing flatus.  FEN: hep lock IVF.   Endo: Diabetes mellitus Type II, under good control..   patient declines diabetic diet and would prefer to manage blood sugar with insulin.[name]  Prophylaxis: pharmacologic prophylaxis (with any of the following: enoxaparin (Lovenox) 40mg  SQ 2 hours prior to surgery then every day).  Plan: Advance diet Dispo:  Continue inpatient care today. The patient is to be discharged to home when she is passing flatus and pain is well controlled on PO meds..   LOS: 1 day    [name] 06/28/2014, 8:58 AM

## 2014-06-28 NOTE — Procedures (Signed)
Pt placed on hospital cpap machine with pt home tubing and nasal pillows.  Tubing inspected by RT prior to use.  3L blended into machine.  Pt resting comfortably at this time.

## 2014-06-29 LAB — GLUCOSE, CAPILLARY
GLUCOSE-CAPILLARY: 96 mg/dL (ref 70–99)
GLUCOSE-CAPILLARY: 82 mg/dL (ref 70–99)
Glucose-Capillary: 92 mg/dL (ref 70–99)
Glucose-Capillary: 72 mg/dL (ref 70–99)

## 2014-06-29 MED ORDER — METFORMIN HCL 500 MG PO TABS
500.0000 mg | ORAL_TABLET | Freq: Two times a day (BID) | ORAL | Status: DC
  Administered 2014-06-29 – 2014-06-30 (×2): 500 mg via ORAL
  Filled 2014-06-29 (×4): qty 1

## 2014-06-29 MED ORDER — WARFARIN - PHARMACIST DOSING INPATIENT: Freq: Every day | Status: DC

## 2014-06-29 MED ORDER — BISACODYL 10 MG RE SUPP
10.0000 mg | Freq: Once | RECTAL | Status: AC
  Administered 2014-06-29: 10 mg via RECTAL
  Filled 2014-06-29: qty 1

## 2014-06-29 MED ORDER — WARFARIN SODIUM 3 MG PO TABS
3.0000 mg | ORAL_TABLET | Freq: Once | ORAL | Status: AC
  Administered 2014-06-29: 3 mg via ORAL
  Filled 2014-06-29: qty 1

## 2014-06-29 NOTE — Progress Notes (Signed)
2 Days Post-Op Procedure(s) (LRB): EXPLORATORY LAPAROTOMY (N/A) HYSTERECTOMY ABDOMINAL TOTAL (N/A) BILATERAL SALPINGO OOPHORECTOMY/TUMOR DEBULKING/LYMPHNODE DISSECTION, OMENTECTOMY (Bilateral)  Subjective: Patient reports feeling extremely depressed because she was told she only has three to five years to live.  She is tolerating solid diet with no nausea or emesis.  Ambulating without difficulty.  Minimal pain reported and adequate pain relief with PO pain medications.  Denies chest pain, dyspnea, passing flatus, or having a bowel movement.  No concerns voiced.    Objective: Vital signs in last 24 hours: Temp:  [98.1 F (36.7 C)-98.9 F (37.2 C)] 98.1 F (36.7 C) (02/18 0845) Pulse Rate:  [69-77] 75 (02/18 0845) Resp:  [18] 18 (02/18 0845) BP: (109-147)/(53-69) 147/65 mmHg (02/18 0845) SpO2:  [93 %-98 %] 93 % (02/18 0845) Last BM Date: 06/27/14  Intake/Output from previous day: 02/17 0701 - 02/18 0700 In: 3 [P.O.:580] Out: 900 [Urine:900]  Physical Examination: General: alert, cooperative and no distress Resp: clear to auscultation bilaterally Cardio: regular rate and rhythm, S1, S2 normal, no murmur, click, rub or gallop GI: incision: abdomen obese, distended, mildly tympanic, active bowel sounds, soft and abdominal incision with staples, dressing removed, staples intact with no erythema or drainage Extremities: extremities normal, atraumatic, no cyanosis or edema  Assessment: 59 y.o. s/p Procedure(s): EXPLORATORY LAPAROTOMY HYSTERECTOMY ABDOMINAL TOTAL BILATERAL SALPINGO OOPHORECTOMY/TUMOR DEBULKING/LYMPHNODE DISSECTION, OMENTECTOMY: stable Pain:  Pain is well-controlled on PRN medications.  Heme: Stable post-operatively.  Last CBC 06/28/14.  CV: BP and HR stable post-operatively.  GI:  Tolerating po: Yes     GU: Adequate output reported.    FEN: Stable post-operatively.  Last Bmet 06/28/14.  Endo: Type 2 Diabetes.  CBG (last 3)   Recent Labs  06/28/14 2150  06/29/14 0727 06/29/14 1142  GLUCAP 113* 92 96   Prophylaxis: SCDs, Lovenox.  Plan to resume Coumadin.  Plan: Diet as tolerated Dulcolax suppository x1 Resume Coumadin Encourage ambulation, IS use, deep breathing, and coughing Continue post-operative plan of care per Dr. Delsa Sale [facility] [place] given the the patient   LOS: 2 days    [name] [place] 06/29/2014, 11:11 AM

## 2014-06-29 NOTE — Progress Notes (Addendum)
ANTICOAGULATION CONSULT NOTE - Initial Consult  Pharmacy Consult for Warfarin Indication: atrial fibrillation  Allergies  Allergen Reactions  . Olmesartan Medoxomil Cough    REACTION: ? if cough  . Tetracycline Hcl     Unknown reaction, too long for patient to remember   . Venlafaxine     REACTION: severe dry moouth  . Adhesive [Tape] Rash    Patient Measurements: Height: 5\' 4"  (162.6 cm) Weight: 201 lb 6 oz (91.343 kg) IBW/kg (Calculated) : 54.7 Heparin Dosing Weight: n/a  Vital Signs: Temp: 98.1 F (36.7 C) (02/18 0845) Temp Source: Oral (02/18 1348) BP: 126/65 mmHg (02/18 1348) Pulse Rate: 74 (02/18 1348)  Labs:  Recent Labs  06/27/14 1110 06/28/14 0525  HGB  --  12.0  HCT  --  36.3  PLT  --  128*  LABPROT 17.8*  --   INR 1.45  --   CREATININE  --  0.57    Estimated Creatinine Clearance: 81.8 mL/min (by C-G formula based on Cr of 0.57).   Medical History: Past Medical History  Diagnosis Date  . Depression   . DVT (deep venous thrombosis)     hx of on HRT left leg ~2006  . GERD (gastroesophageal reflux disease)   . Hyperlipidemia   . Hypertension   . Hypothyroidism   . PPD positive, treated     rx inh   . Diabetes mellitus   . 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 DJD (degenerative joint disease) of lumbar spine   . Chronic diastolic CHF (congestive heart failure)     a. 08/2012 Echo: EF 55-60%, no rwma, mod MR.  [name] A-fib     a. on amio for rate control/coumadin.  [name] Physical deconditioning   . Allergy   . Anemia     low iron hx  . Blood transfusion without reported diagnosis     2 u prbc  . Cataract     removed ou  . OSA on CPAP     cpap 4.5 setting  . Breast cancer 2014    a. 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 acetaminophen (TYLENOL) 500 MG tablet Take 1,000 mg by mouth every 6 (six) hours as needed for mild pain or headache.   06/26/2014 at am  . buPROPion (WELLBUTRIN) 75 MG tablet Take 1 tablet (75 mg total) by mouth 2 (two) times daily. 14 tablet 0 06/27/2014 at 0830  . clonazePAM (KLONOPIN) 0.5 MG tablet Take 0.5 tablets (0.25 mg total) by mouth 2 (two) times daily. 30 tablet 0 06/27/2014 at 0830  . escitalopram (LEXAPRO) 20 MG tablet Take 1 tablet (20 mg total) by mouth daily. 90 tablet 1 06/26/2014 at am  . furosemide (LASIX) 20 MG tablet Take 1 tablet (20 mg total) by mouth daily. 90 tablet 1 06/26/2014 at am  . letrozole (FEMARA) 2.5 MG tablet Take 1 tablet (2.5 mg total) by mouth daily. 90 tablet 0 06/26/2014 at am  . levothyroxine (SYNTHROID, LEVOTHROID) 150 MCG tablet Take 1 tablet (150 mcg total) by mouth daily. 90 tablet 1 06/27/2014 at 0830  . metFORMIN (GLUCOPHAGE) 500 MG tablet Take 1 tablet (500 mg total) by mouth 2 (two) times daily with a meal. 180 tablet 1 06/26/2014 at 1900  . metoprolol  tartrate (LOPRESSOR) 25 MG tablet Take 1 tablet (25 mg total) by mouth 2 (two) times daily. 180 tablet 1 06/27/2014 at 0830  . OLANZapine (ZYPREXA) 2.5 MG tablet TAKE 1 TABLET NIGHTLY AT BEDTIME AS NEEDED FOR SLEEP OR anxiety 90 tablet 1 06/26/2014 at pm  . omeprazole (PRILOSEC) 20 MG capsule 1 tab po bid (Patient taking differently: as needed. 1 tab po bid) 60 capsule 1 06/27/2014 at 0830  . potassium chloride (K-DUR) 10 MEQ tablet Take 10 mEq by mouth 2 (two) times daily.  3 06/26/2014 at am  . spironolactone (ALDACTONE) 25 MG tablet Take 1 tablet (25 mg total) by mouth daily. 90 tablet 1 06/26/2014 at am  . warfarin (COUMADIN) 3 MG tablet Take 1-2 tablets (3-6 mg total) by mouth daily. as directed by coumadin clinic (Patient taking differently: Take 3-6 mg by mouth daily. Rotates 1 tablet then 2 the next day) 135 tablet 1 06/22/2014  . HYDROcodone-homatropine (HYCODAN) 5-1.5 MG/5ML syrup Take 5 mLs by mouth  every 8 (eight) hours as needed for cough. (Patient not taking: Reported on 06/23/2014) 120 mL 0 Taking   Scheduled:  . acetaminophen  1,000 mg Oral Q6H  . buPROPion  75 mg Oral BID  . clonazePAM  0.25 mg Oral BID  . enoxaparin (LOVENOX) injection  40 mg Subcutaneous Q24H  . escitalopram  20 mg Oral Daily  . feeding supplement (ENSURE COMPLETE)  237 mL Oral BID BM  . insulin aspart  0-15 Units Subcutaneous TID WC  . levothyroxine  150 mcg Oral QAC breakfast  . metFORMIN  500 mg Oral BID WC  . metoprolol tartrate  25 mg Oral BID  . pantoprazole  40 mg Oral Daily  . traMADol  100 mg Oral Q12H   Infusions:   PRN: HYDROmorphone (DILAUDID) injection, ondansetron **OR** ondansetron (ZOFRAN) IV, oxyCODONE Anti-infectives    Start     Dose/Rate Route Frequency Ordered Stop   06/27/14 1014  ceFAZolin (ANCEF) IVPB 2 g/50 mL premix     2 g 100 mL/hr over 30 Minutes Intravenous On call to O.R. 06/27/14 1014 06/27/14 1413      Assessment: 58yoF here for abdominal debulking surgery for stage IIIC ovarian cancer, with total hysterectomy and bilateral salpingo-oophorectomy on 2/16.  On warfarin at home for atrial fibrillation; alternates 3 mg and 6 mg daily.  Last dose 2/11 without bridging.  Has been receiving Lovenox 40 mg daily since POD1.  Today Gyn-Onc would like to resume warfarin, with Lovenox prophylaxis continuing until INR therapeutic.  Significant events 2/16: Pre-op INR 1.45 but required Vit K 2.5 mg IV x 1 during procedure for oozing from surgical sites.  Today, 06/29/2014:  CBC: Hgb, Plt WNL  Last INR 1.45 (2/16)  No reported bleeding or surgical complications per nursing  Tolerating solid diet, although nothing charted as far as % of meal eaten   Goal of Therapy:  INR 2-3 Monitor platelets by anticoagulation protocol: Yes   Plan:   Warfarin 3 mg PO x 1 at 1800 tonight.  Unable to predict current INR given lower PO intake but Vit K given.  Will opt for lower home dose  for today and check INR tomorrow AM.  Can proceed with home regimen once INR known.  Continue Lovenox 40 mg SQ q24h for DVT prophylaxis until INR >= 2.0  Daily INR; CBC at least q72 while on warfarin  Monitor for signs of bleeding   Reuel Boom, PharmD Pager: [phone] 06/29/2014, 4:14 PM

## 2014-06-30 DIAGNOSIS — C569 Malignant neoplasm of unspecified ovary: Secondary | ICD-10-CM

## 2014-06-30 LAB — PROTIME-INR
INR: 1.34 (ref 0.00–1.49)
Prothrombin Time: 16.8 seconds — ABNORMAL HIGH (ref 11.6–15.2)

## 2014-06-30 LAB — CLOSTRIDIUM DIFFICILE BY PCR: CDIFFPCR: NEGATIVE

## 2014-06-30 LAB — GLUCOSE, CAPILLARY: Glucose-Capillary: 89 mg/dL (ref 70–99)

## 2014-06-30 MED ORDER — ENOXAPARIN SODIUM 40 MG/0.4ML ~~LOC~~ SOLN: 40.0000 mg | SUBCUTANEOUS | Status: DC

## 2014-06-30 MED ORDER — OXYCODONE HCL 5 MG PO TABS: 5.0000 mg | ORAL_TABLET | ORAL | Status: DC | PRN

## 2014-06-30 NOTE — Discharge Summary (Signed)
Physician Discharge Summary  Patient ID: [name] MRN: [id number] DOB/AGE: 1955-05-30 59 y.o.  Admit date: 06/27/2014 Discharge date: 06/30/2014  Admission Diagnoses: Clear cell carcinoma of ovary  Discharge Diagnoses:  Principal Problem:   Clear cell carcinoma of ovary Active Problems:   Pelvic mass   Pelvic mass in female   Discharged Condition:  The patient is in good condition and stable for discharge.    Hospital Course: On 06/27/2014, the patient underwent the following: Procedure(s): EXPLORATORY LAPAROTOMY, HYSTERECTOMY, ABDOMINAL TOTAL BILATERAL SALPINGO OOPHORECTOMY/TUMOR DEBULKING/LYMPHNODE DISSECTION, OMENTECTOMY.   The postoperative course was uneventful.  She was discharged to home on postoperative day 3 tolerating a regular diet.  She is to continue with lovenox 40 mg SQ daily along with Coumadin until her INR is therapeutic then lovenox can be discontinued.  She is advised to take 6 mg of Coumadin tonight per [facility] pharmacy then resume your usual schedule from before surgery.  Have your PT/INR checked on Monday, Feb 22.    Consults: None  Significant Diagnostic Studies: None  Treatments: surgery: see above   Discharge Exam: Blood pressure 129/69, pulse 67, temperature 98.5 F (36.9 C), temperature source Oral, resp. rate 18, height 5\' 4"  (1.626 m), weight 201 lb 6 oz (91.343 kg), SpO2 98 %. General appearance: alert, cooperative and no distress Resp: clear to auscultation bilaterally Cardio: regular rate and rhythm, S1, S2 normal, no murmur, click, rub or gallop GI: abdomen mildly distended, obese, soft, active bowel sounds Extremities: extremities normal, atraumatic, no cyanosis or edema Incision/Wound: Midline incision with staples without erythema or drainage  Disposition: 01-Home or Self Care      Discharge Instructions    Call MD for:  difficulty breathing, headache or visual disturbances    Complete by:  As directed      Call MD for:  extreme  fatigue    Complete by:  As directed      Call MD for:  hives    Complete by:  As directed      Call MD for:  persistant dizziness or light-headedness    Complete by:  As directed      Call MD for:  persistant nausea and vomiting    Complete by:  As directed      Call MD for:  redness, tenderness, or signs of infection (pain, swelling, redness, odor or green/yellow discharge around incision site)    Complete by:  As directed      Call MD for:  severe uncontrolled pain    Complete by:  As directed      Call MD for:  temperature >100.4    Complete by:  As directed      Diet - low sodium heart healthy    Complete by:  As directed      Driving Restrictions    Complete by:  As directed   No driving for 2 weeks.  Do not take narcotics and drive.     Increase activity slowly    Complete by:  As directed      Lifting restrictions    Complete by:  As directed   No lifting greater than 10 lbs.     Sexual Activity Restrictions    Complete by:  As directed   No sexual activity, nothing in the vagina, for 6 weeks.            Medication List    STOP taking these medications        acetaminophen 500  MG tablet  Commonly known as:  TYLENOL      TAKE these medications        buPROPion 75 MG tablet  Commonly known as:  WELLBUTRIN  Take 1 tablet (75 mg total) by mouth 2 (two) times daily.     clonazePAM 0.5 MG tablet  Commonly known as:  KLONOPIN  Take 0.5 tablets (0.25 mg total) by mouth 2 (two) times daily.     enoxaparin 40 MG/0.4ML injection  Commonly known as:  LOVENOX  Inject 0.4 mLs (40 mg total) into the skin daily.     escitalopram 20 MG tablet  Commonly known as:  LEXAPRO  Take 1 tablet (20 mg total) by mouth daily.     furosemide 20 MG tablet  Commonly known as:  LASIX  Take 1 tablet (20 mg total) by mouth daily.     HYDROcodone-homatropine 5-1.5 MG/5ML syrup  Commonly known as:  HYCODAN  Take 5 mLs by mouth every 8 (eight) hours as needed for cough.      letrozole 2.5 MG tablet  Commonly known as:  FEMARA  Take 1 tablet (2.5 mg total) by mouth daily.     levothyroxine 150 MCG tablet  Commonly known as:  SYNTHROID, LEVOTHROID  Take 1 tablet (150 mcg total) by mouth daily.     metFORMIN 500 MG tablet  Commonly known as:  GLUCOPHAGE  Take 1 tablet (500 mg total) by mouth 2 (two) times daily with a meal.     metoprolol tartrate 25 MG tablet  Commonly known as:  LOPRESSOR  Take 1 tablet (25 mg total) by mouth 2 (two) times daily.     OLANZapine 2.5 MG tablet  Commonly known as:  ZYPREXA  TAKE 1 TABLET NIGHTLY AT BEDTIME AS NEEDED FOR SLEEP OR anxiety     omeprazole 20 MG capsule  Commonly known as:  PRILOSEC  1 tab po bid     oxyCODONE 5 MG immediate release tablet  Commonly known as:  Oxy IR/ROXICODONE  Take 1 tablet (5 mg total) by mouth every 4 (four) hours as needed for severe pain or breakthrough pain.     potassium chloride 10 MEQ tablet  Commonly known as:  K-DUR  Take 10 mEq by mouth 2 (two) times daily.     spironolactone 25 MG tablet  Commonly known as:  ALDACTONE  Take 1 tablet (25 mg total) by mouth daily.     warfarin 3 MG tablet  Commonly known as:  COUMADIN  Take 1-2 tablets (3-6 mg total) by mouth daily. as directed by coumadin clinic       Follow-up Information    Follow up with CROSS, Genella Rife, NP On 07/05/2014.   Specialty:  Gynecologic Oncology   Why:  at the [facility] at 11:30am for staple removal   Contact information:   [place] [address] [phone]       Follow up with [name], MD On 07/10/2014.   Specialty:  Obstetrics and Gynecology   Why:  at 12:00pm at the [facility] for post-op follow up   Contact information:   [address] [place]  [address] [phone]       Greater than thirty minutes were spend for face to face discharge instructions and discharge orders/summary in EPIC.   Signed: CROSS, MELISSA DEAL 06/30/2014, 10:21  AM

## 2014-06-30 NOTE — Progress Notes (Signed)
Pt discharge instructions given to pt and husband with all questions answered. Pt instructed to make appointment to have PT/INR checked on Monday, February 22nd and will do so as ordered.

## 2014-06-30 NOTE — Progress Notes (Signed)
Pt reported having "4-5 watery stools" throughout the night shift. Enteric precautions initiated per protocol and stool sent to lab for c Diff PCR. Will continue to monitor.

## 2014-06-30 NOTE — Progress Notes (Signed)
ANTICOAGULATION CONSULT NOTE - FOLLOW UP  Pharmacy Consult for Warfarin & Lovenox Indication: atrial fibrillation and VTE prophylaxis  Allergies  Allergen Reactions  . Olmesartan Medoxomil Cough    REACTION: ? if cough  . Tetracycline Hcl     Unknown reaction, too long for patient to remember   . Venlafaxine     REACTION: severe dry moouth  . Adhesive [Tape] Rash    Patient Measurements: Height: 5\' 4"  (162.6 cm) Weight: 201 lb 6 oz (91.343 kg) IBW/kg (Calculated) : 54.7 Heparin Dosing Weight: n/a  Vital Signs: Temp: 98.5 F (36.9 C) (02/19 0527) Temp Source: Oral (02/19 0527) BP: 129/69 mmHg (02/19 0527) Pulse Rate: 67 (02/19 0527)  Labs:  Recent Labs  06/27/14 1110 06/28/14 0525 06/30/14 0500  HGB  --  12.0  --   HCT  --  36.3  --   PLT  --  128*  --   LABPROT 17.8*  --  16.8*  INR 1.45  --  1.34  CREATININE  --  0.57  --     Estimated Creatinine Clearance: 81.8 mL/min (by C-G formula based on Cr of 0.57).   Medical History: Past Medical History  Diagnosis Date  . Depression   . DVT (deep venous thrombosis)     hx of on HRT left leg ~2006  . GERD (gastroesophageal reflux disease)   . Hyperlipidemia   . Hypertension   . Hypothyroidism   . PPD positive, treated     rx inh   . Diabetes mellitus   . 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 DJD (degenerative joint disease) of lumbar spine   . Chronic diastolic CHF (congestive heart failure)     a. 08/2012 Echo: EF 55-60%, no rwma, mod MR.  [name] A-fib     a. on amio for rate control/coumadin.  [name] Physical deconditioning   . Allergy   . Anemia     low iron hx  . Blood transfusion without reported diagnosis     2 u prbc  . Cataract     removed ou  . OSA on CPAP     cpap 4.5 setting  . Breast cancer 2014    a. 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 acetaminophen (TYLENOL) 500 MG tablet Take 1,000 mg by mouth every 6 (six) hours as needed for mild pain or headache.   06/26/2014 at am  . buPROPion (WELLBUTRIN) 75 MG tablet Take 1 tablet (75 mg total) by mouth 2 (two) times daily. 14 tablet 0 06/27/2014 at 0830  . clonazePAM (KLONOPIN) 0.5 MG tablet Take 0.5 tablets (0.25 mg total) by mouth 2 (two) times daily. 30 tablet 0 06/27/2014 at 0830  . escitalopram (LEXAPRO) 20 MG tablet Take 1 tablet (20 mg total) by mouth daily. 90 tablet 1 06/26/2014 at am  . furosemide (LASIX) 20 MG tablet Take 1 tablet (20 mg total) by mouth daily. 90 tablet 1 06/26/2014 at am  . letrozole (FEMARA) 2.5 MG tablet Take 1 tablet (2.5 mg total) by mouth daily. 90 tablet 0 06/26/2014 at am  . levothyroxine (SYNTHROID, LEVOTHROID) 150 MCG tablet Take 1 tablet (150 mcg total) by mouth daily. 90 tablet 1 06/27/2014 at 0830  . metFORMIN (GLUCOPHAGE) 500 MG tablet Take 1 tablet (  500 mg total) by mouth 2 (two) times daily with a meal. 180 tablet 1 06/26/2014 at 1900  . metoprolol tartrate (LOPRESSOR) 25 MG tablet Take 1 tablet (25 mg total) by mouth 2 (two) times daily. 180 tablet 1 06/27/2014 at 0830  . OLANZapine (ZYPREXA) 2.5 MG tablet TAKE 1 TABLET NIGHTLY AT BEDTIME AS NEEDED FOR SLEEP OR anxiety 90 tablet 1 06/26/2014 at pm  . omeprazole (PRILOSEC) 20 MG capsule 1 tab po bid (Patient taking differently: as needed. 1 tab po bid) 60 capsule 1 06/27/2014 at 0830  . potassium chloride (K-DUR) 10 MEQ tablet Take 10 mEq by mouth 2 (two) times daily.  3 06/26/2014 at am  . spironolactone (ALDACTONE) 25 MG tablet Take 1 tablet (25 mg total) by mouth daily. 90 tablet 1 06/26/2014 at am  . warfarin (COUMADIN) 3 MG tablet Take 1-2 tablets (3-6 mg total) by mouth daily. as directed by coumadin clinic (Patient taking differently: Take 3-6 mg by mouth daily. Rotates 1 tablet then 2 the next day) 135 tablet 1 06/22/2014   . HYDROcodone-homatropine (HYCODAN) 5-1.5 MG/5ML syrup Take 5 mLs by mouth every 8 (eight) hours as needed for cough. (Patient not taking: Reported on 06/23/2014) 120 mL 0 Taking   Scheduled:  . acetaminophen  1,000 mg Oral Q6H  . buPROPion  75 mg Oral BID  . clonazePAM  0.25 mg Oral BID  . enoxaparin (LOVENOX) injection  40 mg Subcutaneous Q24H  . escitalopram  20 mg Oral Daily  . feeding supplement (ENSURE COMPLETE)  237 mL Oral BID BM  . insulin aspart  0-15 Units Subcutaneous TID WC  . levothyroxine  150 mcg Oral QAC breakfast  . metFORMIN  500 mg Oral BID WC  . metoprolol tartrate  25 mg Oral BID  . pantoprazole  40 mg Oral Daily  . traMADol  100 mg Oral Q12H  . Warfarin - Pharmacist Dosing Inpatient   Does not apply q1800   Infusions:   PRN: HYDROmorphone (DILAUDID) injection, ondansetron **OR** ondansetron (ZOFRAN) IV, oxyCODONE Anti-infectives    Start     Dose/Rate Route Frequency Ordered Stop   06/27/14 1014  ceFAZolin (ANCEF) IVPB 2 g/50 mL premix     2 g 100 mL/hr over 30 Minutes Intravenous On call to O.R. 06/27/14 1014 06/27/14 1413      Assessment: 30 YOF admitted 06/27/14  for abdominal debulking surgery for stage IIIC ovarian cancer, with total hysterectomy and bilateral salpingo-oophorectomy. On warfarin at home for atrial fibrillation. Patient alternates 3 mg and 6 mg of warfarin daily. Last dose 2/11 without bridging. She has been receiving Lovenox 40 mg daily since post-operation day #1. Today is post-op day #3. On 06/29/14 Gyn-Onc decided to resume warfarin, with Lovenox for VTE prophylaxis continuing until INR therapeutic for 24 hours and a minimum of 5 days overlap. Today is day #2 of lovenox/warfarin overlap.   Significant events 2/16: Pre-op INR 1.45 but required Vit K 2.5 mg IV x 1 during procedure for oozing from surgical sites.  Today, 06/30/2014:  CBC: Hgb, Plt WNL (2/17)--no new labs today  INR SUBtherapeutic at 1.34 today (goal 2-3)  No reported  bleeding or surgical complications per nursing  Tolerating solid diet, although nothing charted as far as percentage of meals eaten (questionable dietary intake)  Goal of Therapy:  INR 2-3 Monitor platelets by anticoagulation protocol: Yes  Plan:   Warfarin 6mg  PO x 1. Unable to predict INR given lower PO intake.  Continue Lovenox 40 mg  SQ q24h for VTE prophylaxis until INR theapeutic for 24 hours  Daily INR  CBC at least q72 while on warfarin  Monitor for signs of bleeding  Gloriajean Dell, PharmD Candidate  Dolly Rias RPh 06/30/2014, 10:40 AM Pager [phone]

## 2014-06-30 NOTE — Progress Notes (Signed)
3 Days Post-Op Procedure(s) (LRB): EXPLORATORY LAPAROTOMY (N/A) HYSTERECTOMY ABDOMINAL TOTAL (N/A) BILATERAL SALPINGO OOPHORECTOMY/TUMOR DEBULKING/LYMPHNODE DISSECTION, OMENTECTOMY (Bilateral)  Subjective: Patient reports multiple episodes of diarrhea. + flatus.  She is tolerating solid diet with no nausea or emesis.  Ambulating without difficulty.  Minimal pain reported and adequate pain relief with PO pain medications.  Denies chest pain, dyspnea. No concerns voiced.    Objective: Vital signs in last 24 hours: Temp:  [98.1 F (36.7 C)-98.5 F (36.9 C)] 98.5 F (36.9 C) (02/19 0527) Pulse Rate:  [65-74] 67 (02/19 0527) Resp:  [16-18] 18 (02/19 0527) BP: (121-133)/(63-71) 133/71 mmHg (02/19 1028) SpO2:  [93 %-98 %] 98 % (02/19 0527) Last BM Date: 06/29/14  Intake/Output from previous day: 02/18 0701 - 02/19 0700 In: 480 [P.O.:480] Out: 200 [Urine:200]  Physical Examination: General: alert, cooperative and no distress Resp: clear to auscultation bilaterally Cardio: regular rate and rhythm, S1, S2 normal, no murmur, click, rub or gallop GI: incision: abdomen obese, distended, mildly tympanic, active bowel sounds, soft and abdominal incision with staples, dressing removed, staples intact with no erythema or drainage Extremities: extremities normal, atraumatic, no cyanosis or edema  Assessment: 59 y.o. s/p Procedure(s): EXPLORATORY LAPAROTOMY HYSTERECTOMY ABDOMINAL TOTAL BILATERAL SALPINGO OOPHORECTOMY/TUMOR DEBULKING/LYMPHNODE DISSECTION, OMENTECTOMY: stable Pain:  Pain is well-controlled on PRN medications.  Heme: Stable post-operatively.  Last CBC 06/28/14.  CV: BP and HR stable post-operatively.  GI:  Tolerating po: Yes. Diarrhea is most consistent with postop changes, c diff pending.    GU: Adequate output reported.    FEN: Stable post-operatively.  Last Bmet 06/28/14.  Endo: Type 2 Diabetes.  CBG (last 3)   Recent Labs  06/29/14 1634 06/29/14 2157  06/30/14 0717  GLUCAP 82 72 89   Prophylaxis: SCDs, Lovenox.  Plan to resume Coumadin.  Plan: Diet as tolerated Resume Coumadin. Lovenox prophylactic dosing until coumadin therapeutic. Encourage ambulation, IS use, deep breathing, and coughing Discharge to home today if c,diff neg.   LOS: 3 days    [name] 06/30/2014, 11:50 AM

## 2014-06-30 NOTE — Discharge Instructions (Addendum)
06/30/2014  Return to work: 4-6 weeks if applicable  Take 6 mg of Coumadin tonight then resume your usual schedule from before surgery.  Have your PT/INR checked on Monday, Feb 22.  Activity: 1. Be up and out of the bed during the day.  Take a nap if needed.  You may walk up steps but be careful and use the hand rail.  Stair climbing will tire you more than you think, you may need to stop part way and rest.   2. No lifting or straining for 6 weeks.  3. No driving for 2 week(s).  Do not drive if you are taking narcotic pain medicine.  4. Shower daily.  Use soap and water on your incision and pat dry; don't rub.  No tub baths until cleared by your surgeon.   5. No sexual activity and nothing in the vagina for 6 weeks.  Diet: 1. Low sodium Heart Healthy Diet is recommended.  2. It is safe to use a laxative, such as Miralax or Colace, if you have difficulty moving your bowels.   Wound Care: 1. Keep clean and dry.  Shower daily.  Reasons to call the Doctor:  Fever - Oral temperature greater than 100.4 degrees Fahrenheit  Foul-smelling vaginal discharge  Difficulty urinating  Nausea and vomiting  Increased pain at the site of the incision that is unrelieved with pain medicine.  Difficulty breathing with or without chest pain  New calf pain especially if only on one side  Sudden, continuing increased vaginal bleeding with or without clots.   Contacts: For questions or concerns you should contact:  Dr. Everitt Amber at [phone]  Joylene John, NP at [phone]  After Hours: call [phone] and ask for the GYN Oncologist on call  Oxycodone tablets or capsules What is this medicine? OXYCODONE (ox i KOE done) is a pain reliever. It is used to treat moderate to severe pain. This medicine may be used for other purposes; ask your health care provider or pharmacist if you have questions. COMMON BRAND NAME(S): Dazidox, Endocodone, OXECTA, OxyIR, Percolone,  Roxicodone What should I tell my health care provider before I take this medicine? They need to know if you have any of these conditions: -Addison's disease -brain tumor -drug abuse or addiction -head injury -heart disease -if you frequently drink alcohol containing drinks -kidney disease or problems going to the bathroom -liver disease -lung disease, asthma, or breathing problems -mental problems -an unusual or allergic reaction to oxycodone, codeine, hydrocodone, morphine, other medicines, foods, dyes, or preservatives -pregnant or trying to get pregnant -breast-feeding How should I use this medicine? Take this medicine by mouth with a glass of water. Follow the directions on the prescription label. You can take it with or without food. If it upsets your stomach, take it with food. Take your medicine at regular intervals. Do not take it more often than directed. Do not stop taking except on your doctor's advice. Some brands of this medicine, like Oxecta, have special instructions. Ask your doctor or pharmacist if these directions are for you: Do not cut, crush or chew this medicine. Swallow only one tablet at a time. Do not wet, soak, or lick the tablet before you take it. Talk to your pediatrician regarding the use of this medicine in children. Special care may be needed. Overdosage: If you think you have taken too much of this medicine contact a poison control center or emergency room at once. NOTE: This medicine is only for you. Do not  share this medicine with others. What if I miss a dose? If you miss a dose, take it as soon as you can. If it is almost time for your next dose, take only that dose. Do not take double or extra doses. What may interact with this medicine? -alcohol -antihistamines -certain medicines used for nausea like chlorpromazine, droperidol -erythromycin -ketoconazole -medicines for depression, anxiety, or psychotic disturbances -medicines for sleep -muscle  relaxants -naloxone -naltrexone -narcotic medicines (opiates) for pain -nilotinib -phenobarbital -phenytoin -rifampin -ritonavir -voriconazole This list may not describe all possible interactions. Give your health care provider a list of all the medicines, herbs, non-prescription drugs, or dietary supplements you use. Also tell them if you smoke, drink alcohol, or use illegal drugs. Some items may interact with your medicine. What should I watch for while using this medicine? Tell your doctor or health care professional if your pain does not go away, if it gets worse, or if you have new or a different type of pain. You may develop tolerance to the medicine. Tolerance means that you will need a higher dose of the medicine for pain relief. Tolerance is normal and is expected if you take this medicine for a long time. Do not suddenly stop taking your medicine because you may develop a severe reaction. Your body becomes used to the medicine. This does NOT mean you are addicted. Addiction is a behavior related to getting and using a drug for a non-medical reason. If you have pain, you have a medical reason to take pain medicine. Your doctor will tell you how much medicine to take. If your doctor wants you to stop the medicine, the dose will be slowly lowered over time to avoid any side effects. You may get drowsy or dizzy when you first start taking this medicine or change doses. Do not drive, use machinery, or do anything that may be dangerous until you know how the medicine affects you. Stand or sit up slowly. There are different types of narcotic medicines (opiates) for pain. If you take more than one type at the same time, you may have more side effects. Give your health care provider a list of all medicines you use. Your doctor will tell you how much medicine to take. Do not take more medicine than directed. Call emergency for help if you have problems breathing. This medicine will cause  constipation. Try to have a bowel movement at least every 2 to 3 days. If you do not have a bowel movement for 3 days, call your doctor or health care professional. Your mouth may get dry. Drinking water, chewing sugarless gum, or sucking on hard candy may help. See your dentist every 6 months. What side effects may I notice from receiving this medicine? Side effects that you should report to your doctor or health care professional as soon as possible: -allergic reactions like skin rash, itching or hives, swelling of the face, lips, or tongue -breathing problems -confusion -feeling faint or lightheaded, falls -trouble passing urine or change in the amount of urine -unusually weak or tired Side effects that usually do not require medical attention (report to your doctor or health care professional if they continue or are bothersome): -constipation -dry mouth -itching -nausea, vomiting -upset stomach This list may not describe all possible side effects. Call your doctor for medical advice about side effects. You may report side effects to FDA at 1-800-FDA-1088. Where should I keep my medicine? Keep out of the reach of children. This medicine can  be abused. Keep your medicine in a safe place to protect it from theft. Do not share this medicine with anyone. Selling or giving away this medicine is dangerous and against the law. Store at room temperature between 15 and 30 degrees C (59 and 86 degrees F). Protect from light. Keep container tightly closed. This medicine may cause accidental overdose and death if it is taken by other adults, children, or pets. Flush any unused medicine down the toilet to reduce the chance of harm. Do not use the medicine after the expiration date. NOTE: This sheet is a summary. It may not cover all possible information. If you have questions about this medicine, talk to your doctor, pharmacist, or health care provider.  2015, [employer]vier/Gold Standard. (2013-01-06  13:43:33)  Enoxaparin, Home Use Enoxaparin (Lovenox) injection is a medication used to prevent clots from developing in your veins. Medications such as enoxaparin are called blood thinners or anticoagulants. If blood clots are untreated they could travel to your lungs. This is called a pulmonary embolus. A blood clot in your lungs can be fatal. Caregivers often use anticoagulants such as enoxaparin to prevent clots following surgery. It is also used along with aspirin when the heart is not getting enough blood. Continue the enoxaparin injections as directed by your caregiver. Your caregiver will use blood clotting test results to decide when you can safely stop using enoxaparin injections. If your caregiver prescribes any additional anticoagulant, you must take it exactly as directed. RISKS AND COMPLICATIONS  If you have received recent epidural anesthesia, spinal anesthesia, or a spinal tap while receiving anticoagulants, you are at risk for developing a blood clot in or around the spine. This condition could result in long-term or permanent paralysis.  Because anticoagulants thin your blood, severe bleeding may occur from any tissue or organ. Symptoms of the blood being too thin may include:  Bleeding from the nose or gums that does not stop quickly.  Unusual bruising or bruising easily.  Swelling or pain at an injection site.  A cut that does not stop bleeding within 10 minutes.  Continual nausea for more than 1 day or vomiting blood.  Coughing up blood.  Blood in the urine which may appear as pink, red, or brown urine.  Blood in bowel movements which may appear as red, dark or black stools.  Sudden weakness or numbness of the face, arm, or leg, especially on one side of the body.  Sudden confusion.  Trouble speaking (aphasia) or understanding.  Sudden trouble seeing in one or both eyes.  Sudden trouble walking.  Dizziness.  Loss of balance or coordination.  Severe pain,  such as a headache, joint pain, or back pain.  Fever.  Bruising around the injection sites may be expected.  Platelet drops, known as "thrombocytopenia," can occur with enoxaparin use. A condition called "heparin-induced thrombocytopenia" has been seen. If you have had this condition, you should tell your caregiver. Your caregiver may direct you to have blood tests to monitor this condition.  Do not use if you have allergies to the medication, heparin, or pork products.  Other side effects may include mild local reactions or irritation at the site of injection, pain, bruising, and redness of skin. HOME CARE INSTRUCTIONS You will be instructed by your caregiver how to give enoxaparin injections. 1. Before giving your medication you should make sure the injection is a clear and colorless or pale yellow solution. If your medication becomes discolored or has particles in the bottle, do  not use and notify your caregiver. 2. When using the 30 and 40 mg pre-filled syringes, do not expel the air bubble from the syringe before the injection. This makes sure you use all the medication in the syringe. 3. The injections will be given subcutaneously. This means it is given into the fat over the belly (abdomen). It is given deep beneath the skin but not into the muscle. The shots should be injected around the abdominal wall. Change the sites of injection each time. The whole length of the needle should be introduced into a skin fold held between the thumb and forefinger; the skin fold should be held throughout the injection. Do not rub the injection site after completion of the injection. This increases bruising. Enoxaparin injection pre-filled syringes and graduated pre-filled syringes are available with a system that shields the needle after injection. 4. Inject by pushing the plunger to the bottom of the syringe. 5. Remove the syringe from the injection site keeping your finger on the plunger rod. Be careful  not to stick yourself or others. 6. After injection and the syringe is empty, set off the safety system by firmly pushing the plunger rod. The protective sleeve will automatically cover the needle and you can hear a click. The click means your needle is safely covered. Do not try replacing the needle shield. 7. Get rid of the syringe in the nearest sharps container. 8. Keep your medication safely stored at room temperatures.  Due to the complications of anticoagulants, it is very important that you take your anticoagulant as directed by your caregiver. Anticoagulants need to be taken exactly as instructed. Be sure you understand all your anticoagulant instructions.  Changes in medicines, supplements, diet, and illness can affect your anticoagulation therapy. Be sure to inform your caregivers of any of these changes.  While on anticoagulants, you will need to have blood tests done routinely as directed by your caregivers.  Be careful not to cut yourself when using sharp objects.  Limit physical activities or sports that could result in a fall or cause injury.  It is extremely important that you tell all of your caregivers and dentist that you are taking an anticoagulant, especially if you are injured or plan to have any type of procedure or operation.  Follow up with your laboratory test and caregiver appointments as directed. It is very important to keep your appointments. Not keeping appointments could result in a chronic or permanent injury, pain, or disability. SEEK MEDICAL CARE IF:  You develop any rashes.  You have any worsening of the condition for which you are receiving anticoagulation therapy. SEEK IMMEDIATE MEDICAL CARE IF:  Bleeding from the nose or gums does not stop quickly.  You have unusual bruising or are bruising easily.  Swelling or pain occurs at an injection site.  A cut does not stop bleeding within 10 minutes.  You have continual nausea for more than 1 day or  are vomiting blood.  You are coughing up blood.  You have blood in the urine.  You have dark or black stools.  You have sudden weakness or numbness of the face, arm, or leg, especially on one side of the body.  You have sudden confusion.  You have trouble speaking (aphasia) or understanding.  You have sudden trouble seeing in one or both eyes.  You have sudden trouble walking.  You have dizziness.  You have a loss of balance or coordination.  You have severe pain, such as a headache,  joint pain, or back pain.  You have a serious fall or head injury, even if you are not bleeding.  You have an oral temperature above 102 F (38.9 C), not controlled by medicine. ANY OF THESE SYMPTOMS MAY REPRESENT A SERIOUS PROBLEM THAT IS AN EMERGENCY. Do not wait to see if the symptoms will go away. Get medical help right away. Call your local emergency services (911 in [place] DO NOT drive yourself to the hospital. MAKE SURE YOU:  Understand these instructions.  Will watch your condition.  Will get help right away if you are not doing well or get worse. Document Released: 02/28/2004 Document Revised: 07/21/2011 Document Reviewed: 04/28/2005 [facility] Patient Information 2015 [place], [place]. This information is not intended to replace advice given to you by your health care provider. Make sure you discuss any questions you have with your health care provider.

## 2014-06-30 NOTE — Progress Notes (Signed)
Pt placed on CPAP QHS.  Pt using her tubing and nasal pillows from home with a hospital machine.  Machine plugged into red outlet and humidifier filled with sterile water.  Pt comfortable taking herself on/off of CPAP.  Pt stable and comfortable.

## 2014-07-01 ENCOUNTER — Other Ambulatory Visit: Payer: Self-pay | Admitting: Oncology

## 2014-07-01 DIAGNOSIS — C569 Malignant neoplasm of unspecified ovary: Secondary | ICD-10-CM

## 2014-07-01 LAB — GLUCOSE, CAPILLARY: Glucose-Capillary: 78 mg/dL (ref 70–99)

## 2014-07-01 NOTE — Progress Notes (Unsigned)
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 The uterus and cervix and right ovary were benign. One of 2 left paraortic lymph nodes was involved (no extracapsular extension). An additional 2 left common iliac lymph nodes were negative as well as 2 additional lymph nodes associated with the omentum, which was also negative. The final stage accordingly was pT1c pN1 or stage IIIC  I have looked for a study for Jayah. There was a study, phase II, using standard platinum Taxol therapy plus temsirolimus. Results have not been published.  Given the great difficulty Lucilia had previously with chemotherapy, we are going to try weekly Taxol at 60 mg/m with weekly carboplatin at an AUC of 2, for 18 weeks. She will need a port. I called her today and she is agreeable to proceed with this plan. She will see me within the next 2 weeks to get therapy going.

## 2014-07-03 ENCOUNTER — Other Ambulatory Visit: Payer: Self-pay | Admitting: *Deleted

## 2014-07-03 ENCOUNTER — Telehealth: Payer: Self-pay | Admitting: Oncology

## 2014-07-03 ENCOUNTER — Ambulatory Visit (INDEPENDENT_AMBULATORY_CARE_PROVIDER_SITE_OTHER): Payer: BLUE CROSS/BLUE SHIELD | Admitting: *Deleted

## 2014-07-03 DIAGNOSIS — I4891 Unspecified atrial fibrillation: Secondary | ICD-10-CM

## 2014-07-03 DIAGNOSIS — Z86718 Personal history of other venous thrombosis and embolism: Secondary | ICD-10-CM

## 2014-07-03 LAB — POCT INR: INR: 2

## 2014-07-03 NOTE — Telephone Encounter (Signed)
lvm for pt regarding to March appts...sed added tx....s.w. Tiffany in IR she will contact pt with appt.

## 2014-07-03 NOTE — Telephone Encounter (Signed)
mailed pt appt sched and letter for march

## 2014-07-04 ENCOUNTER — Other Ambulatory Visit: Payer: Self-pay | Admitting: Oncology

## 2014-07-04 ENCOUNTER — Telehealth: Payer: Self-pay | Admitting: Family

## 2014-07-04 NOTE — Telephone Encounter (Signed)
Ok to send 30 tabs zero refills. 

## 2014-07-04 NOTE — Telephone Encounter (Signed)
Pt requesting refill for clonazepam  Last refill: 05/19/2014 #30, 0 refills No contract on file Last UDS: 05/16/2014, moderate risk, next UDS due 08/15/2014

## 2014-07-05 ENCOUNTER — Other Ambulatory Visit: Payer: Self-pay | Admitting: *Deleted

## 2014-07-05 ENCOUNTER — Ambulatory Visit: Payer: Self-pay | Admitting: Gynecologic Oncology

## 2014-07-05 MED ORDER — ALPRAZOLAM ER 1 MG PO TB24
1.0000 mg | ORAL_TABLET | Freq: Two times a day (BID) | ORAL | Status: DC
Start: 2014-07-05 — End: 2014-09-16

## 2014-07-05 NOTE — Telephone Encounter (Signed)
This RN spoke with pt and husband in lobby per appointment with GYN clinic for post surgery check up and staple removal.  Informed pt of scheduled date of port a cath and instructions to hold Lovenox day of procedure only.  Per her inquiry regarding possible need for feeding tube ( used with prior chemo for breast cancer ) MD wants to monitor weight and if needed obtain at that time. [name] verbalizes understanding.  Discussed also follow up with coumadin clinic and if pt during therapy would want coumadin clinic per this office to follow.  [name] states she would like to have INR checked per this office during therapy for convenience.  Per discussion of above pt and husband both became tearful stating increased anxiety since being diagnosed.  [name] states " I wake up at night and just start to cry and can't stop"  [name] also states anxiety is interfering with her eating.  This RN discussed above issue with MD with noted history of venlafaxine causing severe dry mouth and interfering with food intake- Recommendation per MD is for pt to use xanax ER at present and above can be discussed further at MD appointment next week.  Prescription obtained.

## 2014-07-06 ENCOUNTER — Telehealth: Payer: Self-pay | Admitting: *Deleted

## 2014-07-06 ENCOUNTER — Encounter (HOSPITAL_COMMUNITY): Payer: Self-pay

## 2014-07-06 ENCOUNTER — Other Ambulatory Visit: Payer: Self-pay | Admitting: *Deleted

## 2014-07-06 ENCOUNTER — Emergency Department (HOSPITAL_COMMUNITY)
Admission: EM | Admit: 2014-07-06 | Discharge: 2014-07-06 | Disposition: A | Discharge status: Home / Self Care | Payer: BLUE CROSS/BLUE SHIELD | Attending: Emergency Medicine | Admitting: Emergency Medicine

## 2014-07-06 DIAGNOSIS — Z853 Personal history of malignant neoplasm of breast: Secondary | ICD-10-CM | POA: Diagnosis not present

## 2014-07-06 DIAGNOSIS — K08409 Partial loss of teeth, unspecified cause, unspecified class: Secondary | ICD-10-CM

## 2014-07-06 DIAGNOSIS — Z87891 Personal history of nicotine dependence: Secondary | ICD-10-CM | POA: Diagnosis not present

## 2014-07-06 DIAGNOSIS — K219 Gastro-esophageal reflux disease without esophagitis: Secondary | ICD-10-CM | POA: Diagnosis not present

## 2014-07-06 DIAGNOSIS — E039 Hypothyroidism, unspecified: Secondary | ICD-10-CM | POA: Diagnosis not present

## 2014-07-06 DIAGNOSIS — I4891 Unspecified atrial fibrillation: Secondary | ICD-10-CM

## 2014-07-06 DIAGNOSIS — D689 Coagulation defect, unspecified: Secondary | ICD-10-CM | POA: Diagnosis present

## 2014-07-06 DIAGNOSIS — Z7901 Long term (current) use of anticoagulants: Secondary | ICD-10-CM | POA: Diagnosis not present

## 2014-07-06 DIAGNOSIS — G4733 Obstructive sleep apnea (adult) (pediatric): Secondary | ICD-10-CM | POA: Insufficient documentation

## 2014-07-06 DIAGNOSIS — E119 Type 2 diabetes mellitus without complications: Secondary | ICD-10-CM | POA: Insufficient documentation

## 2014-07-06 DIAGNOSIS — Z862 Personal history of diseases of the blood and blood-forming organs and certain disorders involving the immune mechanism: Secondary | ICD-10-CM | POA: Insufficient documentation

## 2014-07-06 DIAGNOSIS — I5032 Chronic diastolic (congestive) heart failure: Secondary | ICD-10-CM | POA: Insufficient documentation

## 2014-07-06 DIAGNOSIS — K068 Other specified disorders of gingiva and edentulous alveolar ridge: Secondary | ICD-10-CM | POA: Diagnosis not present

## 2014-07-06 DIAGNOSIS — I10 Essential (primary) hypertension: Secondary | ICD-10-CM | POA: Diagnosis not present

## 2014-07-06 DIAGNOSIS — K08401 Partial loss of teeth, unspecified cause, class I: Secondary | ICD-10-CM | POA: Diagnosis not present

## 2014-07-06 DIAGNOSIS — Z9851 Tubal ligation status: Secondary | ICD-10-CM | POA: Insufficient documentation

## 2014-07-06 DIAGNOSIS — Z86718 Personal history of other venous thrombosis and embolism: Secondary | ICD-10-CM | POA: Diagnosis not present

## 2014-07-06 DIAGNOSIS — C569 Malignant neoplasm of unspecified ovary: Secondary | ICD-10-CM

## 2014-07-06 LAB — BASIC METABOLIC PANEL
Anion gap: 11 (ref 5–15)
BUN: 8 mg/dL (ref 6–23)
CALCIUM: 8.9 mg/dL (ref 8.4–10.5)
CO2: 25 mmol/L (ref 19–32)
CREATININE: 0.56 mg/dL (ref 0.50–1.10)
Chloride: 105 mmol/L (ref 96–112)
GFR calc Af Amer: >90 mL/min (ref >90)
GFR calc non Af Amer: >90 mL/min (ref >90)
GLUCOSE: 89 mg/dL (ref 70–99)
Potassium: 3.8 mmol/L (ref 3.5–5.1)
Sodium: 141 mmol/L (ref 135–145)

## 2014-07-06 LAB — CBC WITH DIFFERENTIAL/PLATELET
BASOS PCT: 0 % (ref 0–1)
Basophils Absolute: 0 10*3/uL (ref 0.0–0.1)
Eosinophils Absolute: 0.1 10*3/uL (ref 0.0–0.7)
Eosinophils Relative: 3 % (ref 0–5)
HCT: 39.9 % (ref 36.0–46.0)
Hemoglobin: 13.1 g/dL (ref 12.0–15.0)
Lymphocytes Relative: 27 % (ref 12–46)
Lymphs Abs: 1.2 10*3/uL (ref 0.7–4.0)
MCH: 30.7 pg (ref 26.0–34.0)
MCHC: 32.8 g/dL (ref 30.0–36.0)
MCV: 93.4 fL (ref 78.0–100.0)
Monocytes Absolute: 0.4 10*3/uL (ref 0.1–1.0)
Monocytes Relative: 8 % (ref 3–12)
NEUTROS ABS: 2.6 10*3/uL (ref 1.7–7.7)
NEUTROS PCT: 62 % (ref 43–77)
PLATELETS: 172 10*3/uL (ref 150–400)
RBC: 4.27 MIL/uL (ref 3.87–5.11)
RDW: 14.4 % (ref 11.5–15.5)
WBC: 4.3 10*3/uL (ref 4.0–10.5)

## 2014-07-06 LAB — PROTIME-INR
INR: 2.03 — ABNORMAL HIGH (ref 0.00–1.49)
Prothrombin Time: 23.1 seconds — ABNORMAL HIGH (ref 11.6–15.2)

## 2014-07-06 LAB — APTT: aPTT: 42 seconds — ABNORMAL HIGH (ref 24–37)

## 2014-07-06 MED ORDER — TRANEXAMIC ACID 100 MG/ML IV SOLN
500.0000 mg | Freq: Once | INTRAVENOUS | Status: AC
  Administered 2014-07-06: 500 mg via TOPICAL
  Filled 2014-07-06: qty 10

## 2014-07-06 MED ORDER — "THROMBI-PAD 3""X3"" EX PADS"
1.0000 | MEDICATED_PAD | Freq: Once | CUTANEOUS | Status: AC
  Administered 2014-07-06: 1 via TOPICAL
  Filled 2014-07-06: qty 1

## 2014-07-06 NOTE — ED Provider Notes (Signed)
CSN: [id number]     Arrival date & time 07/06/14  0035 History   First MD Initiated Contact with Patient 07/06/14 0102     Chief Complaint  Patient presents with  . Coagulation Disorder     (Consider location/radiation/quality/duration/timing/severity/associated sxs/prior Treatment) HPI Comments: Pt comes in with bleeding gums. She is on lovenox right now.  Pt has hx of recent hysterectomy, and also has mild bleeding from the surgical site. Had her staples removed recently. There is mild bleeding from the gum at the tooth extraction site. No other complains.  The history is provided by the patient.    Past Medical History  Diagnosis Date  . Depression   . DVT (deep venous thrombosis)     hx of on HRT left leg ~2006  . GERD (gastroesophageal reflux disease)   . Hyperlipidemia   . Hypertension   . Hypothyroidism   . PPD positive, treated     rx inh   . Diabetes mellitus   . 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 DJD (degenerative joint disease) of lumbar spine   . Chronic diastolic CHF (congestive heart failure)     a. 08/2012 Echo: EF 55-60%, no rwma, mod MR.  [name] A-fib     a. on amio for rate control/coumadin.  [name] Physical deconditioning   . Allergy   . Anemia     low iron hx  . Blood transfusion without reported diagnosis     2 u prbc  . Cataract     removed ou  . OSA on CPAP     cpap 4.5 setting  . Breast cancer 2014    a. 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 Tubal ligation    . Cholecystectomy    . Foot surgery    . Eye surgery    . Cataract extraction    . Percutaneous liver biopsy    . Breast biopsy      left breast  . Mastectomy modified radical  06/08/2012    Procedure: MASTECTOMY MODIFIED RADICAL;  Surgeon: Edward Jolly, MD;  Location: [facility];  Service: General;   Laterality: Right;  . Portacath placement  06/08/2012    Procedure: INSERTION PORT-A-CATH;  Surgeon: Edward Jolly, MD;  Location: [facility];  Service: General;  Laterality: Left;  . Peg tube placement      peg removed 2015  . History of chemotherapy x 2 treatments, radiation tx  2014  . Pac removed  2015  . Laparotomy N/A 06/27/2014    Procedure: EXPLORATORY LAPAROTOMY;  Surgeon: Everitt Amber, MD;  Location: [facility];  Service: Gynecology;  Laterality: N/A;  . Abdominal hysterectomy N/A 06/27/2014    Procedure: HYSTERECTOMY ABDOMINAL TOTAL;  Surgeon: Everitt Amber, MD;  Location: [facility];  Service: Gynecology;  Laterality: N/A;  . Salpingoophorectomy Bilateral 06/27/2014    Procedure: BILATERAL SALPINGO OOPHORECTOMY/TUMOR DEBULKING/LYMPHNODE DISSECTION, OMENTECTOMY;  Surgeon: Everitt Amber, MD;  Location: [facility];  Service: Gynecology;  Laterality: Bilateral;   Family History  Problem Relation Age of Onset  . Diabetes Mother   . Hypertension Mother   . Arthritis Mother   . Heart disease Mother   . Heart failure Mother   . Other Mother     benign breast mass  . Stroke Father   . Heart disease Father   .  Diabetes Paternal Grandmother   . Colon cancer Paternal Grandfather    History  Substance Use Topics  . Smoking status: Former Smoker -- 1.00 packs/day for 1 years  . Smokeless tobacco: Never Used  . Alcohol Use: No   OB History    Gravida Para Term Preterm AB TAB SAB Ectopic Multiple Living   2 2             Review of Systems  HENT: Positive for dental problem.   Gastrointestinal: Negative for abdominal pain.  Neurological: Negative for dizziness and syncope.  Hematological: Bruises/bleeds easily.      Allergies  Olmesartan medoxomil; Tetracycline hcl; Venlafaxine; and Adhesive  Home Medications   Prior to Admission medications   Medication Sig Start Date End Date Taking? Authorizing Provider  ALPRAZolam (XANAX XR) 1 MG 24 hr tablet Take 1 tablet (1 mg total) by mouth 2 (two)  times daily. 07/05/14  Yes Chauncey Cruel, MD  buPROPion (WELLBUTRIN) 75 MG tablet Take 1 tablet (75 mg total) by mouth 2 (two) times daily. 05/25/14  Yes Debbrah Alar, NP  enoxaparin (LOVENOX) 40 MG/0.4ML injection Inject 0.4 mLs (40 mg total) into the skin daily. 06/30/14  Yes Melissa D Cross, NP  escitalopram (LEXAPRO) 20 MG tablet Take 1 tablet (20 mg total) by mouth daily. 05/23/14  Yes Debbrah Alar, NP  furosemide (LASIX) 20 MG tablet Take 1 tablet (20 mg total) by mouth daily. 05/23/14  Yes Debbrah Alar, NP  letrozole (FEMARA) 2.5 MG tablet Take 1 tablet (2.5 mg total) by mouth daily. 05/22/14  Yes Chauncey Cruel, MD  levothyroxine (SYNTHROID, LEVOTHROID) 150 MCG tablet Take 1 tablet (150 mcg total) by mouth daily. 05/23/14  Yes Debbrah Alar, NP  metFORMIN (GLUCOPHAGE) 500 MG tablet Take 1 tablet (500 mg total) by mouth 2 (two) times daily with a meal. 05/23/14  Yes Debbrah Alar, NP  metoprolol tartrate (LOPRESSOR) 25 MG tablet Take 1 tablet (25 mg total) by mouth 2 (two) times daily. 05/23/14  Yes Debbrah Alar, NP  OLANZapine (ZYPREXA) 2.5 MG tablet TAKE 1 TABLET NIGHTLY AT BEDTIME AS NEEDED FOR SLEEP OR anxiety 05/23/14  Yes Debbrah Alar, NP  omeprazole (PRILOSEC) 20 MG capsule 1 tab po bid Patient taking differently: Take 20 mg by mouth daily.  04/26/14  Yes Meriam Sprague Saguier, PA-C  oxyCODONE (OXY IR/ROXICODONE) 5 MG immediate release tablet Take 1 tablet (5 mg total) by mouth every 4 (four) hours as needed for severe pain or breakthrough pain. 06/30/14  Yes Melissa D Cross, NP  potassium chloride (K-DUR) 10 MEQ tablet Take 10 mEq by mouth 2 (two) times daily. 04/18/14  Yes Historical Provider, MD  spironolactone (ALDACTONE) 25 MG tablet Take 1 tablet (25 mg total) by mouth daily. 05/23/14  Yes Debbrah Alar, NP  clonazePAM (KLONOPIN) 0.5 MG tablet Take 0.5 tablets (0.25 mg total) by mouth 2 (two) times daily. Patient not taking: Reported on 07/06/2014  05/19/14   Debbrah Alar, NP  HYDROcodone-homatropine [facility]) 5-1.5 MG/5ML syrup Take 5 mLs by mouth every 8 (eight) hours as needed for cough. Patient not taking: Reported on 06/23/2014 04/26/14   Meriam Sprague Saguier, PA-C  warfarin (COUMADIN) 3 MG tablet Take 1-2 tablets (3-6 mg total) by mouth daily. as directed by coumadin clinic Patient taking differently: Take 3-6 mg by mouth daily. Rotates 1 tablet then 2 the next day 05/22/14   Thayer Headings, MD   BP 141/72 mmHg  Pulse 82  Temp(Src) 97.8 F (36.6 C) (  Oral)  Resp 20  Ht 5\' 4"  (1.626 m)  Wt 201 lb (91.173 kg)  BMI 34.48 kg/m2  SpO2 99% Physical Exam  Constitutional: She appears well-developed.  HENT:  Head: Atraumatic.  Mouth/Throat:    L upper quadrant tooth extraction, with mild bleeding, dark blood.  Cardiovascular: Normal rate.   Pulmonary/Chest: Effort normal.  Abdominal: Soft.  Surgical wound site is healing well, no signs of infection, maybe 2-3 areas on the stomach where there is small amount of red blood.  Nursing note and vitals reviewed.   ED Course  Procedures (including critical care time) Labs Review Labs Reviewed  APTT - Abnormal; Notable for the following:    aPTT 42 (*)    All other components within normal limits  PROTIME-INR - Abnormal; Notable for the following:    Prothrombin Time 23.1 (*)    INR 2.03 (*)    All other components within normal limits  CBC WITH DIFFERENTIAL/PLATELET  BASIC METABOLIC PANEL    Imaging Review No results found.   EKG Interpretation None      MDM   Final diagnoses:  S/P tooth extraction, unspecified edentulism  Bleeding gums    Pt comes in with bleeding. TXA soaked gauze applied, and pt's bleeding is in better control. It didn't seem like the bleeding was severe at arrival either, but now there is no blood oozing at all. She will call Oral Surgeon tomorrow if needed. The Abd wound - surgicel dressing applied. No active bleeding.  Varney Biles,  MD 07/06/14 [phone]

## 2014-07-06 NOTE — ED Notes (Signed)
Patient's husband reports that they have been unable to stop bleeding following patient's tooth extraction 07/05/14.  He also reports she began bleeding from hysterectomy scar when staples were removed yesterday.  Patient took last dose of Coumadin 07/03/14.  She is currently using Lovenox.

## 2014-07-06 NOTE — Telephone Encounter (Signed)
[name] from [facility] called to inform us that the patient will be undergoing treatment for new cancer diagnosis and they will be monitoring and dosing her Coumadin until all her treatments are completed and then she will follow up with [facility].  They will be obtaining her INR on 07/10/14 and continue from there.

## 2014-07-06 NOTE — Discharge Instructions (Signed)
Dental Extraction A dental extraction procedure refers to a routine tooth extraction performed by your dentist. The procedure depends on where and how the tooth is positioned. The procedure can be very quick, sometimes lasting only seconds. Reasons for dental extraction include:  Tooth decay.  Infections (abscesses).  The need to make room for other teeth.  Gum diseases where the supporting bone has been destroyed.  Fractures of the tooth leaving it unrestorable.  Extra teeth (supernumerary) or grossly malformed teeth.  Baby teeththat have not fallen out in time and have not permitted the the permanent teeth to erupt properly.  In preparation for braces where there is not enough room to align the teeth properly.  Not enough room for wisdom teeth (particularly those that are impacted).  Prior to receiving radiation to the head and neck,teeth in the field of radiation may need to be extracted. LET YOUR CAREGIVER KNOW ABOUT:  Any allergies.  All medicines you are taking:  Including herbs, eye drops, over-the-counter medications, and creams.  Blood thinners (anticoagulants), aspirin, or other drugs that may affect blood clotting.  Use of steroids (through mouth or as creams).  Previous problems with anesthetics, including local anesthetics.  History of bleeding or blood problems.  Previous surgery.  Possibility of pregnancy if this applies.  Smoking history.  Any health problems. RISKS AND COMPLICATIONS As with any procedure, complications may occur, but they can usually be managed by your caregiver. General surgical complications may include:  Reaction to anesthesia.  Damage to surrounding teeth, nerves, tissues, or structures.  Infection.  Bleeding. With appropriate treatment and care after surgery, the following complications are very uncommon:  Dry socket (blood clot does not form or stay in place over empty socket). This can delay healing.  Incomplete  extraction of roots.  Jawbone injury, pain, or weakness. BEFORE THE PROCEDURE  Your dental care provider will:  Take a medical and dental history.  Take an X-ray to evaluate the circumstances and how to best extract the tooth.  Do an oral exam.  Depending on the situation, antibiotics may be given before or after the extraction.  Your caregivers may review the procedure, the local anesthesia and/or sedation being used, and what to expect after the procedure with you.  If needed, your dentist may give you a form of sedation, either by medicine you swallow, gas, or intravenously (IV). This will help to relieve anxiety. Complicated extractions may require the use of general anesthesia. It is important to follow your caregiver's instructions prior to your procedure to avoid complications. Steps before your procedure may include:  Alert your caregiver if you feel ill (sore throat, fever, upset stomach, etc.) in the days leading up to your procedure.  Stop taking certain medications for several days prior to your procedure such as blood thinners.  Take certain medications, such as antibiotics.  Avoid eating and drinking for several hours before the procedure. This will help you to avoid complications from the sedation or anesthesia.  Sign a patient consent form.  Have a friend or family member drive you to the dentist and drive you home after the procedure.  Wear comfortable, loose clothing. Limit makeup and jewelry.  Quit smoking. If you are a smoker, this will raise the chances of a healing problem after your procedure. If you are thinking about quitting, talk to your surgeon about how long before the operation you should stop smoking. You may also get help from your primary caregiver. PROCEDURE Dental extraction is typically done  as an outpatient procedure. IV sedation, local anesthesia, or both may be used. It will keep you comfortable and free of pain during the procedure.    There are 2 types of extractions:  Simple extraction involves a tooth that is visible in the mouth and above the gum line. After local anesthetic is given by injection, and the area is numbed, the dentist will loosen the tooth with a special instrument (elevator). Then another instrument (forceps) will be used to grasp the tooth and remove it from its socket. During the procedure you will feel some pressure, but you should not feel pain. If you do feel pain, tell your dentist. The open socket will be cleaned. Dressings (gauze) will be placed in the socket to reduce bleeding.  Surgical extractions are used if the tooth has not come into the mouth or the tooth is broken off below the gum line. The dentist will make a cut (incision) in the gum and may have to remove some of the bone around the tooth to aid in the removal of the tooth. After removal, stitches (sutures) may be required to close the area to help in healing and control bleeding. For some surgical extractions, you may need a general anesthetic or IV sedation (through the vein). After both types of extractions, you may be given pain medication or other drugs to help healing. Other postoperative instructions will be given by your dental caregiver. AFTER THE PROCEDURE  You will have gauze in your mouth where the tooth was removed. Gentle pressure on the gauze for up to 1 hour will help to control bleeding.  A blood clot will begin to form over the open socket. This is normal. Do not touch the area or rinse it.  Your pain will be controlled with medication and self-care.  You will be given detailed instructions for care after surgery. PROGNOSIS While some discomfort is normal after tooth extraction, most patients recover fully in just a few days. SEEK IMMEDIATE DENTAL CARE  You have uncontrolled bleeding, marked swelling, or severe pain.  You develop a fever, difficulty swallowing, or other severe symptoms.  You have questions or  concerns. Document Released: 04/28/2005 Document Revised: 09/12/2013 Document Reviewed: 08/02/2010 [facility] Patient Information 2015 [place], [place]. This information is not intended to replace advice given to you by your health care provider. Make sure you discuss any questions you have with your health care provider.

## 2014-07-06 NOTE — Progress Notes (Signed)
This RN called and spoke with coumadin clinic on [address] per pt's request to be followed at the [place] [facility] at the cancer center while she undergoes active therapy for OV DX.  Appointment cancelled per [facility] and pt will see [facility] post visit with Dr Denman George on 2/29 for establishing care and follow up.

## 2014-07-07 ENCOUNTER — Telehealth: Payer: Self-pay | Admitting: Oncology

## 2014-07-07 NOTE — Telephone Encounter (Signed)
per Alphonzo Grieve to sch CC-pt aware of sch

## 2014-07-10 ENCOUNTER — Ambulatory Visit: Payer: BLUE CROSS/BLUE SHIELD | Admitting: Pharmacist

## 2014-07-10 ENCOUNTER — Encounter: Payer: Self-pay | Admitting: *Deleted

## 2014-07-10 ENCOUNTER — Other Ambulatory Visit: Payer: Self-pay | Admitting: Radiology

## 2014-07-10 ENCOUNTER — Other Ambulatory Visit (HOSPITAL_BASED_OUTPATIENT_CLINIC_OR_DEPARTMENT_OTHER): Payer: BLUE CROSS/BLUE SHIELD

## 2014-07-10 ENCOUNTER — Ambulatory Visit: Payer: BLUE CROSS/BLUE SHIELD | Attending: Gynecologic Oncology | Admitting: Gynecologic Oncology

## 2014-07-10 ENCOUNTER — Encounter: Payer: Self-pay | Admitting: Gynecologic Oncology

## 2014-07-10 VITALS — BP 144/78 | HR 82 | Temp 98.6°F | Resp 18 | Ht 64.0 in | Wt 182.3 lb

## 2014-07-10 DIAGNOSIS — Z853 Personal history of malignant neoplasm of breast: Secondary | ICD-10-CM | POA: Diagnosis not present

## 2014-07-10 DIAGNOSIS — I5032 Chronic diastolic (congestive) heart failure: Secondary | ICD-10-CM | POA: Diagnosis not present

## 2014-07-10 DIAGNOSIS — Z483 Aftercare following surgery for neoplasm: Secondary | ICD-10-CM

## 2014-07-10 DIAGNOSIS — Z9221 Personal history of antineoplastic chemotherapy: Secondary | ICD-10-CM | POA: Diagnosis not present

## 2014-07-10 DIAGNOSIS — E039 Hypothyroidism, unspecified: Secondary | ICD-10-CM | POA: Diagnosis not present

## 2014-07-10 DIAGNOSIS — K7460 Unspecified cirrhosis of liver: Secondary | ICD-10-CM | POA: Diagnosis not present

## 2014-07-10 DIAGNOSIS — Z7901 Long term (current) use of anticoagulants: Secondary | ICD-10-CM | POA: Insufficient documentation

## 2014-07-10 DIAGNOSIS — Z90710 Acquired absence of both cervix and uterus: Secondary | ICD-10-CM | POA: Insufficient documentation

## 2014-07-10 DIAGNOSIS — I4891 Unspecified atrial fibrillation: Secondary | ICD-10-CM

## 2014-07-10 DIAGNOSIS — G4733 Obstructive sleep apnea (adult) (pediatric): Secondary | ICD-10-CM | POA: Insufficient documentation

## 2014-07-10 DIAGNOSIS — E119 Type 2 diabetes mellitus without complications: Secondary | ICD-10-CM

## 2014-07-10 DIAGNOSIS — Z79899 Other long term (current) drug therapy: Secondary | ICD-10-CM | POA: Diagnosis not present

## 2014-07-10 DIAGNOSIS — Z87891 Personal history of nicotine dependence: Secondary | ICD-10-CM | POA: Insufficient documentation

## 2014-07-10 DIAGNOSIS — F329 Major depressive disorder, single episode, unspecified: Secondary | ICD-10-CM | POA: Insufficient documentation

## 2014-07-10 DIAGNOSIS — C50011 Malignant neoplasm of nipple and areola, right female breast: Secondary | ICD-10-CM

## 2014-07-10 DIAGNOSIS — C562 Malignant neoplasm of left ovary: Secondary | ICD-10-CM

## 2014-07-10 DIAGNOSIS — K219 Gastro-esophageal reflux disease without esophagitis: Secondary | ICD-10-CM | POA: Diagnosis not present

## 2014-07-10 DIAGNOSIS — C569 Malignant neoplasm of unspecified ovary: Secondary | ICD-10-CM | POA: Diagnosis present

## 2014-07-10 DIAGNOSIS — I10 Essential (primary) hypertension: Secondary | ICD-10-CM | POA: Insufficient documentation

## 2014-07-10 DIAGNOSIS — Z86718 Personal history of other venous thrombosis and embolism: Secondary | ICD-10-CM

## 2014-07-10 DIAGNOSIS — Z9011 Acquired absence of right breast and nipple: Secondary | ICD-10-CM | POA: Insufficient documentation

## 2014-07-10 DIAGNOSIS — Z9049 Acquired absence of other specified parts of digestive tract: Secondary | ICD-10-CM | POA: Diagnosis not present

## 2014-07-10 DIAGNOSIS — E785 Hyperlipidemia, unspecified: Secondary | ICD-10-CM | POA: Diagnosis not present

## 2014-07-10 DIAGNOSIS — Z79811 Long term (current) use of aromatase inhibitors: Secondary | ICD-10-CM | POA: Diagnosis not present

## 2014-07-10 DIAGNOSIS — Z90722 Acquired absence of ovaries, bilateral: Secondary | ICD-10-CM | POA: Insufficient documentation

## 2014-07-10 DIAGNOSIS — C50911 Malignant neoplasm of unspecified site of right female breast: Secondary | ICD-10-CM

## 2014-07-10 DIAGNOSIS — C773 Secondary and unspecified malignant neoplasm of axilla and upper limb lymph nodes: Secondary | ICD-10-CM

## 2014-07-10 LAB — CBC WITH DIFFERENTIAL/PLATELET
BASO%: 0.4 % (ref 0.0–2.0)
BASOS ABS: 0 10*3/uL (ref 0.0–0.1)
EOS ABS: 0.1 10*3/uL (ref 0.0–0.5)
EOS%: 2.7 % (ref 0.0–7.0)
HCT: 43.5 % (ref 34.8–46.6)
HEMOGLOBIN: 13.9 g/dL (ref 11.6–15.9)
LYMPH#: 1 10*3/uL (ref 0.9–3.3)
LYMPH%: 19.2 % (ref 14.0–49.7)
MCH: 29.7 pg (ref 25.1–34.0)
MCHC: 31.9 g/dL (ref 31.5–36.0)
MCV: 93 fL (ref 79.5–101.0)
MONO#: 0.4 10*3/uL (ref 0.1–0.9)
MONO%: 7.6 % (ref 0.0–14.0)
NEUT#: 3.7 10*3/uL (ref 1.5–6.5)
NEUT%: 70.1 % (ref 38.4–76.8)
Platelets: 213 10*3/uL (ref 145–400)
RBC: 4.68 10*6/uL (ref 3.70–5.45)
RDW: 14.5 % (ref 11.2–14.5)
WBC: 5.2 10*3/uL (ref 3.9–10.3)

## 2014-07-10 LAB — POCT INR: INR: 1.3

## 2014-07-10 LAB — COMPREHENSIVE METABOLIC PANEL (CC13)
ALBUMIN: 3.6 g/dL (ref 3.5–5.0)
ALT: 45 U/L (ref 0–55)
ANION GAP: 12 meq/L — AB (ref 3–11)
AST: 92 U/L — ABNORMAL HIGH (ref 5–34)
Alkaline Phosphatase: 141 U/L (ref 40–150)
BUN: 5.9 mg/dL — ABNORMAL LOW (ref 7.0–26.0)
CALCIUM: 9.7 mg/dL (ref 8.4–10.4)
CO2: 26 mEq/L (ref 22–29)
Chloride: 102 mEq/L (ref 98–109)
Creatinine: 0.7 mg/dL (ref 0.6–1.1)
EGFR: >90 mL/min/{1.73_m2} (ref >90)
Glucose: 93 mg/dl (ref 70–140)
Potassium: 4.3 mEq/L (ref 3.5–5.1)
SODIUM: 140 meq/L (ref 136–145)
Total Bilirubin: 1.22 mg/dL — ABNORMAL HIGH (ref 0.20–1.20)
Total Protein: 7.5 g/dL (ref 6.4–8.3)

## 2014-07-10 LAB — PROTIME-INR
INR: 1.3 — ABNORMAL LOW (ref 2.00–3.50)
Protime: 15.6 Seconds — ABNORMAL HIGH (ref 10.6–13.4)

## 2014-07-10 MED ORDER — ENOXAPARIN SODIUM 120 MG/0.8ML ~~LOC~~ SOLN: 120.0000 mg | SUBCUTANEOUS | Status: DC

## 2014-07-10 NOTE — Progress Notes (Signed)
Late entry: Patient came to office on Wednesday, 07/05/14 for staple removal. 26 staples removed without difficultly and no redness or drainage noted at incision site. Half inch steri strips applied over incision. Post-op instructions reinforced and patient reminded of followup appt with Dr. Denman George on 07/10/14.

## 2014-07-10 NOTE — Progress Notes (Signed)
INR = 1.3 Pt has been off Coumadin since 2/22 for port placement scheduled for tomorrow (3/1) in IR at [facility]. She has been on Lovenox 40 mg SQ q24 hrs. Pt has been pt of [facility] & now that she will be receiving chemo (Carbo/Taxol weekly for ovarian cancer), she will have her INR monitored at [facility].  Once tx is completed, we can have her return to [facility]. Dx for anticoag: A. Fib & h/o DVT. CBC wnl today. Pt had bleeding from tooth extraction site & abd surg incision site on 2/25 so she went to ED.  The bleeding has stopped. INR subtherapeutic b/c Coumadin on hold for port placement tomorrow. Plan discussed w/ Dr. Jana Hakim as follows: Lovenox 40 mg SQ x 1 today No Lovenox tomorrow (3/1).  May restart Coumadin 6 mg/day except 3 mg on MWF tomorrow after port placement. Begin Lovenox 1.5 mg/kg/day = 120 mg SQ q24hrs on 07/12/14 (24 hrs post-op).  Continue Lovenox until INR "near 2"  (ie ">/= 1.8 and rising" per Dr. Jana Hakim) I ER'D her Lovenox 120 mg SQ today to her pharmacy. I've gone over plan w/ pt & her husband and they both understand.  I gave them our pharmacy contact numbers if needed. Recheck INR on 07/17/14 when she is here for 1st tx.  We'll see her in chemo room. Kennith Center, Pharm.D., CPP 07/10/2014@1 :54 PM

## 2014-07-10 NOTE — Progress Notes (Signed)
Postoperative Followup Note: Gyn-Onc   [name] 59 y.o. female  Chief Complaint  Patient presents with  . 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  Plan:   I lengthy discussion with the patient and her husband regarding the final pathology, and prognosis associated with this disease. I like be discussion regarding our recommendation for adjuvant chemotherapy with 6 cycles of carboplatin and paclitaxel. I discussed the inevitability of recurrence if she would not complete her adjuvant therapy. I discussed that even with adjuvant therapy she stands a high risk for recurrence (approximate 70%).  She is scheduled to see Dr. Orie Fisherman at later this week to discuss adjuvant therapy further with the planned start therapy within the month. She has adequately healed from her surgery and is safe to commence this treatment.    HPI: 59 year old white female who was initially seen in consultation request of Dr. Jana Hakim regarding management of a newly diagnosed pelvic mass. Patient has a past history of breast cancer in January 2014 and is been under the care of Dr. Jana Hakim. As part of the workup for pulmonary nodules she underwent a PET scan on 06/05/2014 which showed no evidence of hypermetabolic uptake in the lungs however a 13.7 x 11.7 cm pelvic mass with hypermetabolic nodules was discovered along with left external iliac and periaortic adenopathy. CA-125 value was 71 units per mL. The patient notes some abdominal discomfort and bloating.  On 06/27/2014 she underwent exploratory laparotomy and TAH, BSO, omentectomy, para-aortic lymphadenectomy and pelvic lymphadenectomy. Intraoperative findings included a large left ovarian mass which was positive for clear cell carcinoma. There was metastatic disease in the left pelvic lymph nodes. The omentum was negative for metastatic disease. Cytology of ascites was  positive for metastatic clear cell carcinoma. At the completion of the surgery there was no macroscopic disease remaining.  Recovery from surgery was uncomplicated. She was seen for some drainage at the midportion of her incision approximately 3 days ago. This has discontinued spontaneously. She denies fevers or chills.  Review of Systems:10 point review of systems is negative except as noted in interval history.   Vitals: Blood pressure 144/78, pulse 82, temperature 98.6 F (37 C), temperature source Oral, resp. rate 18, height 5\' 4"  (1.626 m), weight 182 lb 4.8 oz (82.691 kg).  Physical Exam: General : The patient is a healthy woman in no acute distress.  HEENT: normocephalic, extraoccular movements normal; neck is supple without thyromegally  Lynphnodes: Supraclavicular and inguinal nodes not enlarged  Abdomen: Soft, non-tender, slightly distended, no ascites, no organomegally, no masses, no hernias. Incision healing well. There is a palpable hematoma (organised) in the mid portion of the incision.  Pelvic:  EGBUS: Normal female  Vagina: deferred Urethra and Bladder: deferred Cervix: deferred Uterus: deferred Bi-manual examination: deferred Rectal: deferred Lower extremities: No edema or varicosities. Normal range of motion      Allergies  Allergen Reactions  . Olmesartan Medoxomil Cough    REACTION: ? if cough  . Tetracycline Hcl     Unknown reaction, too long for patient to remember   . Venlafaxine     REACTION: severe dry moouth  . Adhesive [Tape] Rash    Past Medical History  Diagnosis Date  . Depression   . DVT (deep venous thrombosis)     hx of on HRT left leg ~2006  . GERD (gastroesophageal reflux disease)   . Hyperlipidemia   .  Hypertension   . Hypothyroidism   . PPD positive, treated     rx inh   . Diabetes mellitus   . 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 DJD (degenerative joint disease) of lumbar spine   . Chronic diastolic CHF  (congestive heart failure)     a. 08/2012 Echo: EF 55-60%, no rwma, mod MR.  [name] A-fib     a. on amio for rate control/coumadin.  [name] Physical deconditioning   . Allergy   . Anemia     low iron hx  . Blood transfusion without reported diagnosis     2 u prbc  . Cataract     removed ou  . OSA on CPAP     cpap 4.5 setting  . Breast cancer 2014    a. 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 Tubal ligation    . Cholecystectomy    . Foot surgery    . Eye surgery    . Cataract extraction    . Percutaneous liver biopsy    . Breast biopsy      left breast  . Mastectomy modified radical  06/08/2012    Procedure: MASTECTOMY MODIFIED RADICAL;  Surgeon: Edward Jolly, MD;  Location: [facility];  Service: General;  Laterality: Right;  . Portacath placement  06/08/2012    Procedure: INSERTION PORT-A-CATH;  Surgeon: Edward Jolly, MD;  Location: [facility];  Service: General;  Laterality: Left;  . Peg tube placement      peg removed 2015  . History of chemotherapy x 2 treatments, radiation tx  2014  . Pac removed  2015  . Laparotomy N/A 06/27/2014    Procedure: EXPLORATORY LAPAROTOMY;  Surgeon: Everitt Amber, MD;  Location: [facility];  Service: Gynecology;  Laterality: N/A;  . Abdominal hysterectomy N/A 06/27/2014    Procedure: HYSTERECTOMY ABDOMINAL TOTAL;  Surgeon: Everitt Amber, MD;  Location: [facility];  Service: Gynecology;  Laterality: N/A;  . Salpingoophorectomy Bilateral 06/27/2014    Procedure: BILATERAL SALPINGO OOPHORECTOMY/TUMOR DEBULKING/LYMPHNODE DISSECTION, OMENTECTOMY;  Surgeon: Everitt Amber, MD;  Location: [facility];  Service: Gynecology;  Laterality: Bilateral;    Current Outpatient Prescriptions  Medication Sig Dispense Refill  . ALPRAZolam (XANAX XR) 1 MG 24 hr tablet Take 1 tablet (1 mg total) by mouth 2 (two) times daily. 60  tablet 1  . buPROPion (WELLBUTRIN) 75 MG tablet Take 1 tablet (75 mg total) by mouth 2 (two) times daily. 14 tablet 0  . clonazePAM (KLONOPIN) 0.5 MG tablet Take 0.5 tablets (0.25 mg total) by mouth 2 (two) times daily. (Patient taking differently: Take 1 mg by mouth 2 (two) times daily. ) 30 tablet 0  . [START ON 07/12/2014] enoxaparin (LOVENOX) 120 MG/0.8ML injection Inject 0.8 mLs (120 mg total) into the skin daily. 10 Syringe 1  . enoxaparin (LOVENOX) 40 MG/0.4ML injection Inject 0.4 mLs (40 mg total) into the skin daily. 15 Syringe 0  . escitalopram (LEXAPRO) 20 MG tablet Take 1 tablet (20 mg total) by mouth daily. 90 tablet 1  . furosemide (LASIX) 20 MG tablet Take 1 tablet (20 mg total) by mouth daily. 90 tablet 1  . HYDROcodone-homatropine (HYCODAN) 5-1.5 MG/5ML syrup Take 5 mLs by mouth every 8 (eight) hours as needed for cough. (Patient not taking: Reported on 06/23/2014) 120 mL 0  .  letrozole (FEMARA) 2.5 MG tablet Take 1 tablet (2.5 mg total) by mouth daily. 90 tablet 0  . levothyroxine (SYNTHROID, LEVOTHROID) 150 MCG tablet Take 1 tablet (150 mcg total) by mouth daily. 90 tablet 1  . metFORMIN (GLUCOPHAGE) 500 MG tablet Take 1 tablet (500 mg total) by mouth 2 (two) times daily with a meal. 180 tablet 1  . metoprolol tartrate (LOPRESSOR) 25 MG tablet Take 1 tablet (25 mg total) by mouth 2 (two) times daily. 180 tablet 1  . OLANZapine (ZYPREXA) 2.5 MG tablet TAKE 1 TABLET NIGHTLY AT BEDTIME AS NEEDED FOR SLEEP OR anxiety 90 tablet 1  . omeprazole (PRILOSEC) 20 MG capsule 1 tab po bid (Patient taking differently: Take 20 mg by mouth daily as needed (FOR REFLUX). ) 60 capsule 1  . oxyCODONE (OXY IR/ROXICODONE) 5 MG immediate release tablet Take 1 tablet (5 mg total) by mouth every 4 (four) hours as needed for severe pain or breakthrough pain. 30 tablet 0  . potassium chloride (K-DUR) 10 MEQ tablet Take 10 mEq by mouth 2 (two) times daily.  3  . spironolactone (ALDACTONE) 25 MG tablet Take 1  tablet (25 mg total) by mouth daily. 90 tablet 1  . warfarin (COUMADIN) 3 MG tablet Take 1-2 tablets (3-6 mg total) by mouth daily. as directed by coumadin clinic (Patient taking differently: Take 3-6 mg by mouth daily. Rotates 1 tablet then 2 the next day) 135 tablet 1   No current facility-administered medications for this visit.    History   Social History  . Marital Status: Married    Spouse Name: N/A  . Number of Children: 2  . Years of Education: N/A   Occupational History  . [place] History Main Topics  . Smoking status: Former Smoker -- 1.00 packs/day for 1 years  . Smokeless tobacco: Never Used  . Alcohol Use: No  . Drug Use: No     Comment: quit age 67 only smoked as a teen 1 year  . Sexual Activity: No   Other Topics Concern  . Not on file   Social History Narrative   Married   Works at [employer] secretary   Daily caffeine use - 2 cups a day plus a couple of sodas a day   Pt doesn't exercise regularly   G2P2   H H of 5 soon to be 6 .     Family History  Problem Relation Age of Onset  . Diabetes Mother   . Hypertension Mother   . Arthritis Mother   . Heart disease Mother   . Heart failure Mother   . Other Mother     benign breast mass  . Stroke Father   . Heart disease Father   . Diabetes Paternal Grandmother   . Colon cancer Paternal Grandfather       [name], MD 07/10/2014, 1:49 PM

## 2014-07-10 NOTE — Patient Instructions (Signed)
Plan to follow up with Dr. Denman George after completion of six cycles of chemotherapy.

## 2014-07-11 ENCOUNTER — Ambulatory Visit (HOSPITAL_COMMUNITY)
Admission: RE | Admit: 2014-07-11 | Discharge: 2014-07-11 | Disposition: A | Discharge status: Home / Self Care | Payer: BLUE CROSS/BLUE SHIELD | Source: Ambulatory Visit | Attending: Interventional Radiology | Admitting: Interventional Radiology

## 2014-07-11 ENCOUNTER — Other Ambulatory Visit: Payer: Self-pay | Admitting: Oncology

## 2014-07-11 ENCOUNTER — Ambulatory Visit (HOSPITAL_COMMUNITY)
Admission: RE | Admit: 2014-07-11 | Discharge: 2014-07-11 | Disposition: A | Discharge status: Home / Self Care | Payer: BLUE CROSS/BLUE SHIELD | Source: Ambulatory Visit | Attending: Oncology | Admitting: Oncology

## 2014-07-11 ENCOUNTER — Encounter (HOSPITAL_COMMUNITY): Payer: Self-pay

## 2014-07-11 DIAGNOSIS — Z79899 Other long term (current) drug therapy: Secondary | ICD-10-CM | POA: Diagnosis not present

## 2014-07-11 DIAGNOSIS — I10 Essential (primary) hypertension: Secondary | ICD-10-CM | POA: Insufficient documentation

## 2014-07-11 DIAGNOSIS — Z853 Personal history of malignant neoplasm of breast: Secondary | ICD-10-CM | POA: Insufficient documentation

## 2014-07-11 DIAGNOSIS — Z90710 Acquired absence of both cervix and uterus: Secondary | ICD-10-CM | POA: Insufficient documentation

## 2014-07-11 DIAGNOSIS — Z9011 Acquired absence of right breast and nipple: Secondary | ICD-10-CM | POA: Insufficient documentation

## 2014-07-11 DIAGNOSIS — C799 Secondary malignant neoplasm of unspecified site: Secondary | ICD-10-CM | POA: Insufficient documentation

## 2014-07-11 DIAGNOSIS — F329 Major depressive disorder, single episode, unspecified: Secondary | ICD-10-CM | POA: Insufficient documentation

## 2014-07-11 DIAGNOSIS — Z86718 Personal history of other venous thrombosis and embolism: Secondary | ICD-10-CM | POA: Insufficient documentation

## 2014-07-11 DIAGNOSIS — I5032 Chronic diastolic (congestive) heart failure: Secondary | ICD-10-CM | POA: Insufficient documentation

## 2014-07-11 DIAGNOSIS — K7460 Unspecified cirrhosis of liver: Secondary | ICD-10-CM | POA: Diagnosis not present

## 2014-07-11 DIAGNOSIS — E785 Hyperlipidemia, unspecified: Secondary | ICD-10-CM | POA: Diagnosis not present

## 2014-07-11 DIAGNOSIS — K219 Gastro-esophageal reflux disease without esophagitis: Secondary | ICD-10-CM | POA: Insufficient documentation

## 2014-07-11 DIAGNOSIS — C569 Malignant neoplasm of unspecified ovary: Secondary | ICD-10-CM | POA: Insufficient documentation

## 2014-07-11 DIAGNOSIS — G4733 Obstructive sleep apnea (adult) (pediatric): Secondary | ICD-10-CM | POA: Insufficient documentation

## 2014-07-11 DIAGNOSIS — E119 Type 2 diabetes mellitus without complications: Secondary | ICD-10-CM | POA: Diagnosis not present

## 2014-07-11 DIAGNOSIS — E039 Hypothyroidism, unspecified: Secondary | ICD-10-CM | POA: Insufficient documentation

## 2014-07-11 DIAGNOSIS — Z90722 Acquired absence of ovaries, bilateral: Secondary | ICD-10-CM | POA: Insufficient documentation

## 2014-07-11 DIAGNOSIS — Z87891 Personal history of nicotine dependence: Secondary | ICD-10-CM | POA: Diagnosis not present

## 2014-07-11 DIAGNOSIS — Z7901 Long term (current) use of anticoagulants: Secondary | ICD-10-CM | POA: Diagnosis not present

## 2014-07-11 DIAGNOSIS — I4891 Unspecified atrial fibrillation: Secondary | ICD-10-CM | POA: Diagnosis not present

## 2014-07-11 LAB — BASIC METABOLIC PANEL
Anion gap: 12 (ref 5–15)
BUN: 6 mg/dL (ref 6–23)
CO2: 24 mmol/L (ref 19–32)
Calcium: 9.2 mg/dL (ref 8.4–10.5)
Chloride: 101 mmol/L (ref 96–112)
Creatinine, Ser: 0.59 mg/dL (ref 0.50–1.10)
GFR calc Af Amer: >90 mL/min (ref >90)
GFR calc non Af Amer: >90 mL/min (ref >90)
GLUCOSE: 110 mg/dL — AB (ref 70–99)
Potassium: 3.7 mmol/L (ref 3.5–5.1)
Sodium: 137 mmol/L (ref 135–145)

## 2014-07-11 LAB — CBC
HEMATOCRIT: 41.5 % (ref 36.0–46.0)
Hemoglobin: 13.4 g/dL (ref 12.0–15.0)
MCH: 30.4 pg (ref 26.0–34.0)
MCHC: 32.3 g/dL (ref 30.0–36.0)
MCV: 94.1 fL (ref 78.0–100.0)
PLATELETS: 187 10*3/uL (ref 150–400)
RBC: 4.41 MIL/uL (ref 3.87–5.11)
RDW: 14.3 % (ref 11.5–15.5)
WBC: 4.4 10*3/uL (ref 4.0–10.5)

## 2014-07-11 LAB — APTT: aPTT: 33 seconds (ref 24–37)

## 2014-07-11 LAB — PROTIME-INR
INR: 1.41 (ref 0.00–1.49)
PROTHROMBIN TIME: 17.4 s — AB (ref 11.6–15.2)

## 2014-07-11 LAB — GLUCOSE, CAPILLARY: Glucose-Capillary: 104 mg/dL — ABNORMAL HIGH (ref 70–99)

## 2014-07-11 MED ORDER — FENTANYL CITRATE 0.05 MG/ML IJ SOLN
INTRAMUSCULAR | Status: AC | PRN
  Administered 2014-07-11 (×2): 25 ug via INTRAVENOUS
  Administered 2014-07-11: 50 ug via INTRAVENOUS

## 2014-07-11 MED ORDER — CEFAZOLIN SODIUM-DEXTROSE 2-3 GM-% IV SOLR
2.0000 g | Freq: Once | INTRAVENOUS | Status: AC
  Administered 2014-07-11: 2 g via INTRAVENOUS

## 2014-07-11 MED ORDER — FENTANYL CITRATE 0.05 MG/ML IJ SOLN
INTRAMUSCULAR | Status: AC
  Filled 2014-07-11: qty 4

## 2014-07-11 MED ORDER — SODIUM CHLORIDE 0.9 % IV SOLN
Freq: Once | INTRAVENOUS | Status: AC
  Administered 2014-07-11: 08:00:00 via INTRAVENOUS

## 2014-07-11 MED ORDER — CEFAZOLIN SODIUM-DEXTROSE 2-3 GM-% IV SOLR
INTRAVENOUS | Status: AC
  Filled 2014-07-11: qty 50

## 2014-07-11 MED ORDER — LIDOCAINE-EPINEPHRINE 2 %-1:100000 IJ SOLN
INTRAMUSCULAR | Status: AC
  Filled 2014-07-11: qty 1

## 2014-07-11 MED ORDER — HEPARIN SOD (PORK) LOCK FLUSH 100 UNIT/ML IV SOLN
INTRAVENOUS | Status: AC | PRN
  Administered 2014-07-11: 500 [IU]

## 2014-07-11 MED ORDER — LIDOCAINE HCL 1 % IJ SOLN
INTRAMUSCULAR | Status: AC
  Filled 2014-07-11: qty 20

## 2014-07-11 MED ORDER — MIDAZOLAM HCL 2 MG/2ML IJ SOLN
INTRAMUSCULAR | Status: AC | PRN
  Administered 2014-07-11: 0.5 mg via INTRAVENOUS
  Administered 2014-07-11: 1 mg via INTRAVENOUS
  Administered 2014-07-11: 0.5 mg via INTRAVENOUS
  Administered 2014-07-11 (×2): 1 mg via INTRAVENOUS

## 2014-07-11 MED ORDER — HEPARIN SOD (PORK) LOCK FLUSH 100 UNIT/ML IV SOLN
INTRAVENOUS | Status: AC
  Filled 2014-07-11: qty 5

## 2014-07-11 MED ORDER — MIDAZOLAM HCL 2 MG/2ML IJ SOLN
INTRAMUSCULAR | Status: AC
  Filled 2014-07-11: qty 6

## 2014-07-11 NOTE — Progress Notes (Signed)
Patient and husband asked about discontinuing previous advanced directive (scanned in media 07/15/13). Called chaplain and he stated that if a patient completes a new advanced directive it will supersede the prior one. New advanced directive packet given to patient and informed patient when completed to bring back to hospital at a future appointment. Verbalized understanding.

## 2014-07-11 NOTE — Procedures (Signed)
Successful LT IJ POWER PORT TIP SVC/RA NO COMP STABLE READY FOR USE FULL REPORT IN PACS  

## 2014-07-11 NOTE — Discharge Instructions (Signed)

## 2014-07-11 NOTE — Progress Notes (Signed)
Patient has schedule from coumadin clinic on how to resume coumadin/lovenox post port placement. To resume 6mg  coumadin tonight and no lovenox until tomorrow. Reviewed with Dr. Annamaria Boots and patient is to proceed as written by coumadin clinic.

## 2014-07-11 NOTE — H&P (Signed)
Chief Complaint: "I'm getting a port a cath"  Referring Physician(s): Magrinat,Gustav C  History of Present Illness: [name] is a 59 y.o. female with prior history of right breast cancer and now with recently diagnosed metastatic clear cell ovarian carcinoma who presents today for port a cath placement for chemotherapy.   Past Medical History  Diagnosis Date  . Depression   . DVT (deep venous thrombosis)     hx of on HRT left leg ~2006  . GERD (gastroesophageal reflux disease)   . Hyperlipidemia   . Hypertension   . Hypothyroidism   . PPD positive, treated     rx inh   . Diabetes mellitus   . 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 DJD (degenerative joint disease) of lumbar spine   . Chronic diastolic CHF (congestive heart failure)     a. 08/2012 Echo: EF 55-60%, no rwma, mod MR.  [name] A-fib     a. on amio for rate control/coumadin.  [name] Physical deconditioning   . Allergy   . Anemia     low iron hx  . Blood transfusion without reported diagnosis     2 u prbc  . Cataract     removed ou  . OSA on CPAP     cpap 4.5 setting  . Breast cancer 2014    a. 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 Tubal ligation    . Cholecystectomy    . Foot surgery    . Eye surgery    . Cataract extraction    . Percutaneous liver biopsy    . Breast biopsy      left breast  . Mastectomy modified radical  06/08/2012    Procedure: MASTECTOMY MODIFIED RADICAL;  Surgeon: Edward Jolly, MD;  Location: [facility];  Service: General;  Laterality: Right;  . Portacath placement  06/08/2012    Procedure: INSERTION PORT-A-CATH;  Surgeon: Edward Jolly, MD;  Location: [place];  Service: General;  Laterality: Left;  . Peg tube placement      peg removed 2015  . History of chemotherapy x 2 treatments,  radiation tx  2014  . Pac removed  2015  . Laparotomy N/A 06/27/2014    Procedure: EXPLORATORY LAPAROTOMY;  Surgeon: Everitt Amber, MD;  [facility];  Service: Gynecology;  Laterality: N/A;  . Abdominal hysterectomy N/A 06/27/2014    Procedure: HYSTERECTOMY ABDOMINAL TOTAL;  Surgeon: Everitt Amber, MD;  [facility];  Service: Gynecology;  Laterality: N/A;  . Salpingoophorectomy Bilateral 06/27/2014    Procedure: BILATERAL SALPINGO OOPHORECTOMY/TUMOR DEBULKING/LYMPHNODE DISSECTION, OMENTECTOMY;  Surgeon: Everitt Amber, MD;  [facility];  Service: Gynecology;  Laterality: Bilateral;    Allergies: Olmesartan medoxomil; Tetracycline hcl; Venlafaxine; and Adhesive  Medications: Prior to Admission medications   Medication Sig Start Date End Date Taking? Authorizing Provider  ALPRAZolam (XANAX XR) 1 MG 24 hr tablet Take 1 tablet (1 mg total) by mouth 2 (two) times daily. 07/05/14  Yes Chauncey Cruel, MD  buPROPion (WELLBUTRIN) 75 MG tablet Take 1 tablet (75 mg total) by mouth 2 (two) times daily. 05/25/14  Yes Debbrah Alar, NP  clonazePAM (KLONOPIN) 0.5 MG tablet Take 0.5 tablets (0.25 mg total) by mouth 2 (two) times daily. Patient taking differently: Take 1 mg by mouth 2 (  two) times daily.  05/19/14  Yes Debbrah Alar, NP  enoxaparin (LOVENOX) 120 MG/0.8ML injection Inject 0.8 mLs (120 mg total) into the skin daily. 07/12/14  Yes Chauncey Cruel, MD  enoxaparin (LOVENOX) 40 MG/0.4ML injection Inject 0.4 mLs (40 mg total) into the skin daily. 06/30/14  Yes Melissa D Cross, NP  escitalopram (LEXAPRO) 20 MG tablet Take 1 tablet (20 mg total) by mouth daily. 05/23/14  Yes Debbrah Alar, NP  furosemide (LASIX) 20 MG tablet Take 1 tablet (20 mg total) by mouth daily. 05/23/14  Yes Debbrah Alar, NP  letrozole (FEMARA) 2.5 MG tablet Take 1 tablet (2.5 mg total) by mouth daily. 05/22/14  Yes Chauncey Cruel, MD  levothyroxine (SYNTHROID, LEVOTHROID) 150 MCG tablet Take 1 tablet (150  mcg total) by mouth daily. 05/23/14  Yes Debbrah Alar, NP  metFORMIN (GLUCOPHAGE) 500 MG tablet Take 1 tablet (500 mg total) by mouth 2 (two) times daily with a meal. 05/23/14  Yes Debbrah Alar, NP  metoprolol tartrate (LOPRESSOR) 25 MG tablet Take 1 tablet (25 mg total) by mouth 2 (two) times daily. 05/23/14  Yes Debbrah Alar, NP  OLANZapine (ZYPREXA) 2.5 MG tablet TAKE 1 TABLET NIGHTLY AT BEDTIME AS NEEDED FOR SLEEP OR anxiety 05/23/14  Yes Debbrah Alar, NP  omeprazole (PRILOSEC) 20 MG capsule 1 tab po bid Patient taking differently: Take 20 mg by mouth daily as needed (FOR REFLUX).  04/26/14  Yes Meriam Sprague Saguier, PA-C  oxyCODONE (OXY IR/ROXICODONE) 5 MG immediate release tablet Take 1 tablet (5 mg total) by mouth every 4 (four) hours as needed for severe pain or breakthrough pain. 06/30/14  Yes Melissa D Cross, NP  potassium chloride (K-DUR) 10 MEQ tablet Take 10 mEq by mouth 2 (two) times daily. 04/18/14  Yes Historical Provider, MD  spironolactone (ALDACTONE) 25 MG tablet Take 1 tablet (25 mg total) by mouth daily. 05/23/14  Yes Debbrah Alar, NP  HYDROcodone-homatropine (HYCODAN) 5-1.5 MG/5ML syrup Take 5 mLs by mouth every 8 (eight) hours as needed for cough. Patient not taking: Reported on 06/23/2014 04/26/14   Meriam Sprague Saguier, PA-C  warfarin (COUMADIN) 3 MG tablet Take 1-2 tablets (3-6 mg total) by mouth daily. as directed by coumadin clinic Patient taking differently: Take 3-6 mg by mouth daily. Rotates 1 tablet then 2 the next day 05/22/14   Thayer Headings, MD    Family History  Problem Relation Age of Onset  . Diabetes Mother   . Hypertension Mother   . Arthritis Mother   . Heart disease Mother   . Heart failure Mother   . Other Mother     benign breast mass  . Stroke Father   . Heart disease Father   . Diabetes Paternal Grandmother   . Colon cancer Paternal Grandfather     History   Social History  . Marital Status: Married    Spouse Name: N/A  .  Number of Children: 2  . Years of Education: N/A   Occupational History  . [place] History Main Topics  . Smoking status: Former Smoker -- 1.00 packs/day for 1 years  . Smokeless tobacco: Never Used  . Alcohol Use: No  . Drug Use: No     Comment: quit age 42 only smoked as a teen 1 year  . Sexual Activity: No   Other Topics Concern  . 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       Review of Systems  Constitutional: Negative for fever and chills.  Respiratory: Negative for cough and shortness of breath.   Cardiovascular: Negative for chest pain.  Gastrointestinal: Positive for abdominal pain. Negative for nausea, vomiting and blood in stool.  Genitourinary: Negative for dysuria and hematuria.  Musculoskeletal: Negative for back pain.  Neurological: Negative for headaches.    Vital Signs: BP 142/76 mmHg  Pulse 78  Temp(Src) 97.8 F (36.6 C) (Oral)  Resp 16  SpO2 99%  Physical Exam  Constitutional: She is oriented to person, place, and time. She appears well-developed and well-nourished.  Cardiovascular: Normal rate and regular rhythm.   Pulmonary/Chest: Effort normal and breath sounds normal.  Abdominal: Soft. Bowel sounds are normal.  Clean midline incision  Musculoskeletal: Normal range of motion. She exhibits no edema.  Neurological: She is alert and oriented to person, place, and time.    Imaging: No results found.  Labs:  CBC:  Recent Labs  06/28/14 0525 07/06/14 0118 07/10/14 1240 07/11/14 0745  WBC 8.7 4.3 5.2 4.4  HGB 12.0 13.1 13.9 13.4  HCT 36.3 39.9 43.5 41.5  PLT 128* 172 213 187    COAGS:  Recent Labs  07/06/14 0118 07/10/14 07/10/14 1240 07/11/14 0745  INR 2.03* 1.3 1.30* 1.41  APTT 42*  --   --  33    BMP:  Recent Labs   11/01/13 0904  04/26/14 1342  06/28/14 0525 07/06/14 0118 07/10/14 1240 07/11/14 0745  NA 136  < > 139  < > 138 141 140 137  K 4.0  < > 4.0  < > 4.3 3.8 4.3 3.7  CL 103  < > 107  --  104 105  --  101  CO2 22  < > 21  < > 24 25 26 24   GLUCOSE 283*  < > 58*  < > 194* 89 93 110*  BUN 7  < > 6  < > 10 8 5.9* 6  CALCIUM 9.4  < > 9.2  < > 8.2* 8.9 9.7 9.2  CREATININE 0.63  < > 0.7  < > 0.57 0.56 0.7 0.59  GFRNONAA >89  --   --   --  >90 >90  --  >90  GFRAA >89  --   --   --  >90 >90  --  >90  < > = values in this interval not displayed.  LIVER FUNCTION TESTS:  Recent Labs  06/05/14 9937 06/16/14 1223 06/28/14 0525 07/10/14 1240  BILITOT 1.35* 1.01 1.3* 1.22*  AST 62* 83* 59* 92*  ALT 38 43 32 45  ALKPHOS 152* 162* 77 141  PROT 7.1 7.7 6.1 7.5  ALBUMIN 3.3* 3.5 3.0* 3.6    TUMOR MARKERS:  Recent Labs  06/05/14 1696  CEA 2.1    Assessment and Plan: [name] is a 59 y.o. female with prior history of right breast cancer and now with recently diagnosed metastatic clear cell ovarian carcinoma who presents today for port a cath placement for chemotherapy. Details/risks of procedure d/w pt/husband with their understanding and consent.      Signed: Autumn Messing 07/11/2014, 8:27 AM   I spent a total of 20 minutes face to face in clinical consultation, greater than 50% of which was counseling/coordinating  care for port a cath placement.

## 2014-07-12 NOTE — Telephone Encounter (Signed)
Rx called to pharmacist at [facility], #30 x no refill. Notified pt.

## 2014-07-12 NOTE — Telephone Encounter (Signed)
Pt calling to check the status of medication refill clonazePAM (KLONOPIN) 0.5 MG tablet

## 2014-07-14 ENCOUNTER — Other Ambulatory Visit: Payer: BLUE CROSS/BLUE SHIELD

## 2014-07-14 ENCOUNTER — Ambulatory Visit (HOSPITAL_BASED_OUTPATIENT_CLINIC_OR_DEPARTMENT_OTHER): Payer: BLUE CROSS/BLUE SHIELD | Admitting: Oncology

## 2014-07-14 VITALS — BP 148/71 | HR 89 | Temp 98.1°F | Resp 18 | Ht 64.0 in | Wt 178.3 lb

## 2014-07-14 DIAGNOSIS — I480 Paroxysmal atrial fibrillation: Secondary | ICD-10-CM

## 2014-07-14 DIAGNOSIS — C50911 Malignant neoplasm of unspecified site of right female breast: Secondary | ICD-10-CM

## 2014-07-14 DIAGNOSIS — F329 Major depressive disorder, single episode, unspecified: Secondary | ICD-10-CM

## 2014-07-14 DIAGNOSIS — C569 Malignant neoplasm of unspecified ovary: Secondary | ICD-10-CM

## 2014-07-14 MED ORDER — ONDANSETRON HCL 8 MG PO TABS: 8.0000 mg | ORAL_TABLET | Freq: Two times a day (BID) | ORAL | Status: DC

## 2014-07-14 MED ORDER — PROCHLORPERAZINE MALEATE 10 MG PO TABS: 10.0000 mg | ORAL_TABLET | Freq: Four times a day (QID) | ORAL | Status: DC | PRN

## 2014-07-14 MED ORDER — DEXAMETHASONE 4 MG PO TABS: 8.0000 mg | ORAL_TABLET | Freq: Two times a day (BID) | ORAL | Status: DC

## 2014-07-14 MED ORDER — LORAZEPAM 0.5 MG PO TABS: 0.5000 mg | ORAL_TABLET | Freq: Every evening | ORAL | Status: DC | PRN

## 2014-07-14 MED ORDER — LORAZEPAM 0.5 MG PO TABS: 0.5000 mg | ORAL_TABLET | Freq: Four times a day (QID) | ORAL | Status: DC | PRN

## 2014-07-14 MED ORDER — LIDOCAINE-PRILOCAINE 2.5-2.5 % EX CREA: TOPICAL_CREAM | CUTANEOUS | Status: DC

## 2014-07-14 NOTE — Progress Notes (Signed)
ID: [name]   DOB: 1955-08-28  MR#: [id number]  SWN#:[id number]  PCP: [name], NP GYN:  SU: [facility] OTHER XF:GHWEXH [name], Shanon Ace, Roney Jaffe  CHIEF COMPLAINT:  Estrogen receptor positive Right Breast Cancer  CURRENT TREATMENT: Letrozole   HISTORY OF PRESENT ILLNESS: From the original consult note:  [name] noted a mass in her right breast mid December, and as it did not spontaneously resolve over a couple of weeks she brought it to her primary physician's attention. She was set up for diagnostic mammography and right breast ultrasonography at the breast Center 05/10/2012. (Note that the patient's most recent prior mammography had been in October 2008). The current study showed a spiculated mass in the superior subareolar portion of the right breast measuring approximately 5 cm and associated with pleomorphic calcifications. This was firm and palpable. There was right nipple retraction and skin thickening. Ultrasound confirmed an irregularly marginated hypoechoic mass measuring 3.5 cm by ultrasound, and an abnormal appearing lower right axillary lymph node measuring 2.6 cm.  Biopsies of both the breast mass and the abnormal appearing lymph node were performed 05/21/2012. Both showed an invasive ductal carcinoma, grade 2, with similar prognostic panels (the breast mass was 100% estrogen and 73% progesterone receptor positive, with an MIB-1 of 5%; the lymph node was 100% estrogen 100% progesterone receptor positive, with an MIB-1 of 20%). Both masses were HER-2 negative.  Breast MRI obtained at [facility] 05/29/2012 confirmed a dominant mass in the retroareolar right breast measuring 4.4 cm maximally. There was a satellite nodule inferior and lateral to this mass, measuring 1.7 cm. There were no other masses in either breast. Aside from the previously biopsied lymph node there were other mildly enhancing level I right axillary lymph  nodes which did not appear pathologic. There was no other lymphadenopathy noted.   The patient's subsequent history is as detailed below.   INTERVAL HISTORY: [name] returns today for review of her restaging studies accompanied by her husband [name]. 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 One of 2 left para-aortic lymph nodes was positive for metastatic spread. An additional 5 lymph nodes were clear (left para-aortic left common iliac, and omental). [name] did well with her surgery, without significant or unusual pain, bleeding or fever complications. She has already had a port placed and is here today to discuss the details of her upcoming chemotherapy  REVIEW OF SYSTEMS: [name] admits to being very anxious, but she is managing it. She has been sleeping "okay". She has lost a few pounds and is very concerned about her appetite and sense of taste. There is no nausea or vomiting however. Her bowel movements are now much more normal on probiotics. Her gums are little sore. She bruises easily, of course on anticoagulants. She admits to depression. She has been crying "a lot". She is terrified that she is going to get into the same situation as she was with her earlier chemotherapy, when she just couldn't eat and had to have a PEG tube placed. Aside from these issues a detailed review of systems today was noncontributory  PAST MEDICAL HISTORY: Past Medical History  Diagnosis Date  . Depression   . DVT (deep venous thrombosis)  hx of on HRT left leg ~2006  . GERD (gastroesophageal reflux disease)   . Hyperlipidemia   . Hypertension   . Hypothyroidism   . PPD  positive, treated     rx inh   . Diabetes mellitus   . 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 DJD (degenerative joint disease) of lumbar spine   . Chronic diastolic CHF (congestive heart failure)     a. 08/2012 Echo: EF 55-60%, no rwma, mod MR.  [name] A-fib     a. on amio for rate control/coumadin.  [name] Physical deconditioning   . Allergy   . Anemia     low iron hx  . Blood transfusion without reported diagnosis     2 u prbc  . Cataract     removed ou  . OSA on CPAP     cpap 4.5 setting  . Breast cancer 2014    a. 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 Tubal ligation    . Cholecystectomy    . Foot surgery    . Eye surgery    . Cataract extraction    . Percutaneous liver biopsy    . Breast biopsy      left breast  . Mastectomy modified radical  06/08/2012    Procedure: MASTECTOMY MODIFIED RADICAL;  Surgeon: Edward Jolly, MD;  Location: [place];  Service: General;  Laterality: Right;  . Portacath placement  06/08/2012    Procedure: INSERTION PORT-A-CATH;  Surgeon: Edward Jolly, MD;  Location: [place];  Service: General;  Laterality: Left;  . Peg tube placement      peg removed 2015  . History of chemotherapy x 2 treatments, radiation tx  2014  . Pac removed  2015  . Laparotomy N/A 06/27/2014    Procedure: EXPLORATORY LAPAROTOMY;  Surgeon: Everitt Amber, MD;  Location: [facility];  Service: Gynecology;  Laterality: N/A;  . Abdominal hysterectomy N/A 06/27/2014    Procedure: HYSTERECTOMY ABDOMINAL TOTAL;  Surgeon: Everitt Amber, MD;  Location: [facility];  Service: Gynecology;  Laterality: N/A;  . Salpingoophorectomy Bilateral 06/27/2014    Procedure: BILATERAL SALPINGO OOPHORECTOMY/TUMOR DEBULKING/LYMPHNODE DISSECTION, OMENTECTOMY;  Surgeon: Everitt Amber, MD;  Location: [facility];   Service: Gynecology;  Laterality: Bilateral;    FAMILY HISTORY Family History  Problem Relation Age of Onset  . Diabetes Mother   . Hypertension Mother   . Arthritis Mother   . Heart disease Mother   . Heart failure Mother   . Other Mother     benign breast mass  . Stroke Father   . Heart disease Father   . Diabetes Paternal Grandmother   . Colon cancer Paternal Grandfather    the patient's father died in his 98s with a history of dementia. He had had prior strokes. The patient's mother died in her 40s, with a history of congestive heart failure. Nishi had no brothers, one sister. There is no history of breast or ovarian cancer in the family.  GYNECOLOGIC HISTORY: Menarche age 20, first live birth age 63, she is GX P2, menopause approximately 15 years ago, on hormone replacement until 2010.  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 Her husband [name] works for [employer]. Daughter Paul Half is a Physiological scientist and lives in [place]. Daughter Santiago Bur and her family (husband and 2 children aged 64 and 3 years) currently live with the patient. Tonjua is a member of a [employer].   ADVANCED DIRECTIVES: Not in place  HEALTH MAINTENANCE: (Updated October 2014) History  Substance Use Topics  . Smoking status: Former Smoker -- 1.00 packs/day for 1 years  . Smokeless tobacco: Never Used  . Alcohol Use: No     Colonoscopy: Never  PAP: Does not recall  Bone density: Never  Lipid panel:   Allergies  Allergen Reactions  . Olmesartan Medoxomil Cough    REACTION: ? if cough  . Tetracycline Hcl     Unknown reaction, too long for patient to remember   . Venlafaxine     REACTION: severe dry moouth  . Adhesive [Tape] Rash    Current Outpatient Prescriptions  Medication Sig Dispense Refill  . ALPRAZolam (XANAX XR) 1 MG 24 hr tablet Take 1 tablet (1  mg total) by mouth 2 (two) times daily. 60 tablet 1  . buPROPion (WELLBUTRIN) 75 MG tablet Take 1 tablet (75 mg total) by mouth 2 (two) times daily. 14 tablet 0  . clonazePAM (KLONOPIN) 0.5 MG tablet TAKE 1/2 TABLET TWICE DAILY 30 tablet 0  . dexamethasone (DECADRON) 4 MG tablet Take 2 tablets (8 mg total) by mouth 2 (two) times daily with a meal. Start the day after chemotherapy for 3 days. 30 tablet 1  . enoxaparin (LOVENOX) 120 MG/0.8ML injection Inject 0.8 mLs (120 mg total) into the skin daily. 10 Syringe 1  . enoxaparin (LOVENOX) 40 MG/0.4ML injection Inject 0.4 mLs (40 mg total) into the skin daily. 15 Syringe 0  . escitalopram (LEXAPRO) 20 MG tablet Take 1 tablet (20 mg total) by mouth daily. 90 tablet 1  . furosemide (LASIX) 20 MG tablet Take 1 tablet (20 mg total) by mouth daily. 90 tablet 1  . letrozole (FEMARA) 2.5 MG tablet Take 1 tablet (2.5 mg total) by mouth daily. 90 tablet 0  . levothyroxine (SYNTHROID, LEVOTHROID) 150 MCG tablet Take 1 tablet (150 mcg total) by mouth daily. 90 tablet 1  . LORazepam (ATIVAN) 0.5 MG tablet Take 1 tablet (0.5 mg total) by mouth every 6 (six) hours as needed (Nausea or vomiting). 30 tablet 0  . metFORMIN (GLUCOPHAGE) 500 MG tablet Take 1 tablet (500 mg total) by mouth 2 (two) times daily with a meal. 180 tablet 1  . metoprolol tartrate (LOPRESSOR) 25 MG tablet Take 1 tablet (25 mg total) by mouth 2 (two) times daily. 180 tablet 1  . OLANZapine (ZYPREXA) 2.5 MG tablet TAKE 1 TABLET NIGHTLY AT BEDTIME AS NEEDED FOR SLEEP OR anxiety 90 tablet 1  . omeprazole (PRILOSEC) 20 MG capsule 1 tab po bid (Patient taking differently: Take 20 mg by mouth daily as needed (FOR REFLUX). ) 60 capsule 1  . ondansetron (ZOFRAN) 8 MG tablet Take 1 tablet (8 mg total) by mouth 2 (two) times daily. Start the day after chemo for 3 days. Then take as needed for nausea or vomiting. 30 tablet 1  . oxyCODONE (OXY IR/ROXICODONE) 5 MG immediate release tablet Take 1 tablet (5 mg  total) by mouth every 4 (four) hours as needed for severe pain or breakthrough pain. 30 tablet 0  . potassium chloride (K-DUR) 10 MEQ tablet Take 10 mEq by mouth 2 (two) times daily.  3  . prochlorperazine (COMPAZINE) 10 MG tablet Take  1 tablet (10 mg total) by mouth every 6 (six) hours as needed (Nausea or vomiting). 30 tablet 1  . spironolactone (ALDACTONE) 25 MG tablet Take 1 tablet (25 mg total) by mouth daily. 90 tablet 1  . warfarin (COUMADIN) 3 MG tablet Take 1-2 tablets (3-6 mg total) by mouth daily. as directed by coumadin clinic (Patient taking differently: Take 3-6 mg by mouth daily. Rotates 1 tablet then 2 the next day) 135 tablet 1   No current facility-administered medications for this visit.    OBJECTIVE: Middle-aged white woman who appears stated age 42 Vitals:   07/14/14 1414  BP: 148/71  Pulse: 89  Temp: 98.1 F (36.7 C)  Resp: 18     Body mass index is 30.59 kg/(m^2).    ECOG FS: 1 Filed Weights   07/14/14 1414  Weight: 178 lb 4.8 oz (80.876 kg)   Sclerae unicteric, pupils round and equal Oropharynx clear and moist, no obvious come disease No cervical or supraclavicular adenopathy Lungs no rales or rhonchi Heart regular rate and rhythm Abd soft, obese, nontender, positive bowel sounds; the abdominal incision is healing nicely. There are some areas of subjacent in duration, but no dehiscence, erythema, or swelling MSK no focal spinal tenderness, no upper extremity lymphedema Neuro: nonfocal, well oriented, appropriate affect Breasts: Deferred   LAB RESULTS: Lab Results  Component Value Date   WBC 4.4 07/11/2014   NEUTROABS 3.7 07/10/2014   HGB 13.4 07/11/2014   HCT 41.5 07/11/2014   MCV 94.1 07/11/2014   PLT 187 07/11/2014      Chemistry      Component Value Date/Time   NA 137 07/11/2014 0745   NA 140 07/10/2014 1240   K 3.7 07/11/2014 0745   K 4.3 07/10/2014 1240   CL 101 07/11/2014 0745   CL 104 10/29/2012 1058   CO2 24 07/11/2014 0745    CO2 26 07/10/2014 1240   BUN 6 07/11/2014 0745   BUN 5.9* 07/10/2014 1240   CREATININE 0.59 07/11/2014 0745   CREATININE 0.7 07/10/2014 1240   CREATININE 0.69 11/10/2013 0828      Component Value Date/Time   CALCIUM 9.2 07/11/2014 0745   CALCIUM 9.7 07/10/2014 1240   ALKPHOS 141 07/10/2014 1240   ALKPHOS 77 06/28/2014 0525   AST 92* 07/10/2014 1240   AST 59* 06/28/2014 0525   ALT 45 07/10/2014 1240   ALT 32 06/28/2014 0525   BILITOT 1.22* 07/10/2014 1240   BILITOT 1.3* 06/28/2014 0525      STUDIES: Ir Fluoro Guide Cv Line Left  07/11/2014   CLINICAL DATA:  PRIOR HISTORY BREAST CANCER, NEW DIAGNOSIS OF OVARIAN CANCER.  EXAM: LEFT INTERNAL JUGULAR SINGLE LUMEN POWER PORT CATHETER INSERTION  Date:  3/1/20163/05/2014 10:20 am  Radiologist:  M. Daryll Brod, MD  Guidance:  ULTRASOUND AND FLUOROSCOPIC  FLUOROSCOPY TIME:  1 MINUTES 12 SECONDS  MEDICATIONS AND MEDICAL HISTORY: 2 g Ancefadministered within 1 hour of the procedure.Versed and fentanyl for conscious sedation  ANESTHESIA/SEDATION: 40 minutes  CONTRAST:  None.  COMPLICATIONS: None immediate  PROCEDURE: Informed consent was obtained from the patient following explanation of the procedure, risks, benefits and alternatives. The patient understands, agrees and consents for the procedure. All questions were addressed. A time out was performed.  Maximal barrier sterile technique utilized including caps, mask, sterile gowns, sterile gloves, large sterile drape, hand hygiene, and 2% chlorhexidine scrub.  Under sterile conditions and local anesthesia, right internal jugular micropuncture venous access was performed. Access was performed with  ultrasound. Images were obtained for documentation. A guide wire was inserted followed by a transitional dilator. This allowed insertion of a guide wire and catheter into the IVC. Measurements were obtained from the SVC / RA junction back to the right IJ venotomy site. In the right infraclavicular chest, a  subcutaneous pocket was created over the second anterior rib. This was done under sterile conditions and local anesthesia. 1% lidocaine with epinephrine was utilized for this. A 2.5 cm incision was made in the skin. Blunt dissection was performed to create a subcutaneous pocket over the right pectoralis major muscle. The pocket was flushed with saline vigorously. There was adequate hemostasis. The port catheter was assembled and checked for leakage. The port catheter was secured in the pocket with two retention sutures. The tubing was tunneled subcutaneously to the right venotomy site and inserted into the SVC/RA junction through a valved peel-away sheath. Position was confirmed with fluoroscopy. Images were obtained for documentation. The patient tolerated the procedure well. No immediate complications. Incisions were closed in a two layer fashion with 4 - 0 Vicryl suture. Dermabond was applied to the skin. The port catheter was accessed, blood was aspirated followed by saline and heparin flushes. Needle was removed. A dry sterile dressing was applied.  IMPRESSION: Ultrasound and fluoroscopically guided right internal jugular single lumen power port catheter insertion. Tip in the SVC/RA junction. Catheter ready for use.   Electronically Signed   By: Jerilynn Mages.  Shick M.D.   On: 07/11/2014 10:31   Ir US Guide Vasc Access Left  07/11/2014   CLINICAL DATA:  PRIOR HISTORY BREAST CANCER, NEW DIAGNOSIS OF OVARIAN CANCER.  EXAM: LEFT INTERNAL JUGULAR SINGLE LUMEN POWER PORT CATHETER INSERTION  Date:  3/1/20163/05/2014 10:20 am  Radiologist:  M. Daryll Brod, MD  Guidance:  ULTRASOUND AND FLUOROSCOPIC  FLUOROSCOPY TIME:  1 MINUTES 12 SECONDS  MEDICATIONS AND MEDICAL HISTORY: 2 g Ancefadministered within 1 hour of the procedure.Versed and fentanyl for conscious sedation  ANESTHESIA/SEDATION: 40 minutes  CONTRAST:  None.  COMPLICATIONS: None immediate  PROCEDURE: Informed consent was obtained from the patient following explanation  of the procedure, risks, benefits and alternatives. The patient understands, agrees and consents for the procedure. All questions were addressed. A time out was performed.  Maximal barrier sterile technique utilized including caps, mask, sterile gowns, sterile gloves, large sterile drape, hand hygiene, and 2% chlorhexidine scrub.  Under sterile conditions and local anesthesia, right internal jugular micropuncture venous access was performed. Access was performed with ultrasound. Images were obtained for documentation. A guide wire was inserted followed by a transitional dilator. This allowed insertion of a guide wire and catheter into the IVC. Measurements were obtained from the SVC / RA junction back to the right IJ venotomy site. In the right infraclavicular chest, a subcutaneous pocket was created over the second anterior rib. This was done under sterile conditions and local anesthesia. 1% lidocaine with epinephrine was utilized for this. A 2.5 cm incision was made in the skin. Blunt dissection was performed to create a subcutaneous pocket over the right pectoralis major muscle. The pocket was flushed with saline vigorously. There was adequate hemostasis. The port catheter was assembled and checked for leakage. The port catheter was secured in the pocket with two retention sutures. The tubing was tunneled subcutaneously to the right venotomy site and inserted into the SVC/RA junction through a valved peel-away sheath. Position was confirmed with fluoroscopy. Images were obtained for documentation. The patient tolerated the procedure well. No immediate complications. Incisions  were closed in a two layer fashion with 4 - 0 Vicryl suture. Dermabond was applied to the skin. The port catheter was accessed, blood was aspirated followed by saline and heparin flushes. Needle was removed. A dry sterile dressing was applied.  IMPRESSION: Ultrasound and fluoroscopically guided right internal jugular single lumen power port  catheter insertion. Tip in the SVC/RA junction. Catheter ready for use.   Electronically Signed   By: Jerilynn Mages.  Shick M.D.   On: 07/11/2014 10:31    CT CHEST WITHOUT CONTRAST  TECHNIQUE: Multidetector CT imaging of the chest was performed following the standard protocol without IV contrast.  COMPARISON: Chest x-ray of 04/26/2014  FINDINGS: On lung window images, there is radiation fibrosis within the anterior right upper mid upper lung field which may account for the vague opacity questioned recently on chest x-ray. Also there is right apical pleural parenchymal scarring present. No infiltrate or effusion is seen. Changes of right mastectomy are noted and surgical clips are present in the right axilla from prior right axillary nodal dissection. However, on lung window images, there are 2 new noncalcified nodules present, both in the right middle lobe, one of 8 mm and a second of 4 mm in diameter. These are not seen on the prior CT chest of 11/07/2008, and therefore these nodules are worrisome for metastatic involvement. No left lung nodules are seen. Somewhat prominent interstitial markings are present primarily at the lung bases posteriorly. These may be chronic in nature but mild fluid overload cannot be excluded. The central airway is patent.  On soft tissue window images, on this unenhanced study, there is no change in previously noted mediastinal lymph nodes. None appear pathologically enlarged. Coronary artery calcifications are present primarily in the distribution of the left anterior descending artery. No pericardial effusion is seen. The liver appears somewhat nodular peripherally and changes of cirrhosis are a consideration with splenomegaly also present. No bony abnormality is seen.  IMPRESSION: 1. The area questioned by chest x-ray in the right upper lobe probably represents radiation fibrosis. No right upper lobe lesion is seen. 2. 2 noncalcified nodules in the  right middle lobe, not present on the CT from 2010, therefore worrisome for possible metastatic involvement. 3. Prominent interstitial markings primarily at the lung bases posteriorly may be chronic, but mild fluid overload cannot be excluded. 4. Nodular periphery of the liver may represent changes of cirrhosis with splenomegaly also present.   Electronically Signed  By: Ivar Drape M.D.  On: 05/16/2014 08:18   ASSESSMENT: 59 y.o. [name]   (1)  status post right mastectomy  06/08/2012 for a pT3, pN1a, stage IIIA invasive ductal carcinoma, grade 2,  estrogen and progesterone receptor positive, HER-2/neu negative, with an MIB-1 of 20%.    (2)  treated in the adjuvant setting with docetaxel/ cyclophosphamide given every 3 weeks. There were multiple and severe complications, and the  patient tolerated only two cycles, last dose 07/29/2012  (3) letrozole started 08/16/2012  (a) osteopenia, with a T score of -1.14 April 2013  (4) postmastectomy radiation completed 12/24/2012  (5)  comorbidities include diabetes, hypertension, chronic liver disease with cirrhosis and fatty liver, and nutritional disturbance with temporarily dependence on PEG feeds (discontinued July 2014).  (6) restaging studies January 2016 show  (a) 8 mm and 4 mm Right middle lobe lung nodules, not metabolically active on PET -- Rhonda require follow-up  (b) hypermetabolic 63.8 cm mixed solid/cystic lesion in upper pelvis, with associated adenopathy  (7) status post exploratory laparotomy  06/27/2014 with total abdominal hysterectomy, bilateral salpingo-oophorectomy, para-aortic lymphadenectomy, omentectomy and radical tumor debulking for a clear cell ovarian cancer, pT1c pN1, stage IIIC .  (8) adjuvant chemotherapy Rhonda consist of carboplatin and paclitaxel given weekly days 1 and 8 of each 21 day cycle, for 6-8 cycles as tolerated   PLAN: Elin of course is very anxious and somewhat depressed at  having a difficult to treat cancer and needing to go through chemotherapy again. She did not do well with the chemotherapy for breast cancer and is dreading having similar experiences.  We are going to go with weekly carbotaxol and I think she Rhonda tolerate this much better. She Rhonda receive it on days 1 and 8 of each 21 day cycle. Today we discussed antiemetics and other supportive agents. I went ahead and placed all his scripts at her local pharmacy as well.  She Rhonda start dexamethasone 8 mg twice daily and ondansetron 8 mg twice daily the morning after chemotherapy and continue for 3 days. If she has nausea despite these antiemetics she Rhonda add prochlorperazine up to 4 times a day. She has lorazepam to help her sleep on the nights when she takes dexamethasone.  She has already lost approximately 7 pounds. That is not that unusual postoperatively. However she became severely malnourished with her earlier chemotherapy some years ago. We are going to follow her weight closely and if necessary we can always replace her PEG tube and feet are that way, but she and I both are hoping that Rhonda not become necessary.  She Rhonda start her treatment Monday, March 7. She Rhonda see my 76 assistant March 11 just to make sure she did well with the first dose and to make any changes necessary before the second dose. We'll then see her with subsequent doses. The goal is to try to get 6-8 cycles in and then initiate long-term follow-up.  Tenita has a good understanding of the overall plan. She agrees with it. She knows the goal of treatment in her case is cure. She Rhonda call with any problems that may develop before next visit here.    Chauncey Cruel, MD      07/14/2014

## 2014-07-17 ENCOUNTER — Telehealth: Payer: Self-pay | Admitting: *Deleted

## 2014-07-17 ENCOUNTER — Encounter: Payer: Self-pay | Admitting: Nurse Practitioner

## 2014-07-17 ENCOUNTER — Other Ambulatory Visit: Payer: Self-pay | Admitting: Oncology

## 2014-07-17 ENCOUNTER — Encounter: Payer: Self-pay | Admitting: Family

## 2014-07-17 ENCOUNTER — Ambulatory Visit (HOSPITAL_BASED_OUTPATIENT_CLINIC_OR_DEPARTMENT_OTHER): Payer: BLUE CROSS/BLUE SHIELD

## 2014-07-17 ENCOUNTER — Ambulatory Visit (HOSPITAL_BASED_OUTPATIENT_CLINIC_OR_DEPARTMENT_OTHER): Payer: BLUE CROSS/BLUE SHIELD | Admitting: Nurse Practitioner

## 2014-07-17 ENCOUNTER — Ambulatory Visit (HOSPITAL_BASED_OUTPATIENT_CLINIC_OR_DEPARTMENT_OTHER): Payer: BLUE CROSS/BLUE SHIELD | Admitting: Pharmacist

## 2014-07-17 ENCOUNTER — Other Ambulatory Visit (HOSPITAL_BASED_OUTPATIENT_CLINIC_OR_DEPARTMENT_OTHER): Payer: BLUE CROSS/BLUE SHIELD

## 2014-07-17 DIAGNOSIS — I4891 Unspecified atrial fibrillation: Secondary | ICD-10-CM

## 2014-07-17 DIAGNOSIS — C569 Malignant neoplasm of unspecified ovary: Secondary | ICD-10-CM

## 2014-07-17 DIAGNOSIS — Z5111 Encounter for antineoplastic chemotherapy: Secondary | ICD-10-CM

## 2014-07-17 DIAGNOSIS — C50911 Malignant neoplasm of unspecified site of right female breast: Secondary | ICD-10-CM

## 2014-07-17 DIAGNOSIS — T7840XA Allergy, unspecified, initial encounter: Secondary | ICD-10-CM | POA: Insufficient documentation

## 2014-07-17 LAB — PROTIME-INR
INR: 1.8 — ABNORMAL LOW (ref 2.00–3.50)
Protime: 21.6 Seconds — ABNORMAL HIGH (ref 10.6–13.4)

## 2014-07-17 LAB — COMPREHENSIVE METABOLIC PANEL (CC13)
ALBUMIN: 3.6 g/dL (ref 3.5–5.0)
ALT: 32 U/L (ref 0–55)
AST: 67 U/L — AB (ref 5–34)
Alkaline Phosphatase: 144 U/L (ref 40–150)
Anion Gap: 13 mEq/L — ABNORMAL HIGH (ref 3–11)
BUN: 6.5 mg/dL — ABNORMAL LOW (ref 7.0–26.0)
CHLORIDE: 104 meq/L (ref 98–109)
CO2: 23 mEq/L (ref 22–29)
Calcium: 9.6 mg/dL (ref 8.4–10.4)
Creatinine: 0.7 mg/dL (ref 0.6–1.1)
EGFR: >90 mL/min/{1.73_m2} (ref >90)
Glucose: 103 mg/dl (ref 70–140)
POTASSIUM: 3.8 meq/L (ref 3.5–5.1)
Sodium: 141 mEq/L (ref 136–145)
TOTAL PROTEIN: 7.4 g/dL (ref 6.4–8.3)
Total Bilirubin: 1.18 mg/dL (ref 0.20–1.20)

## 2014-07-17 LAB — CBC WITH DIFFERENTIAL/PLATELET
BASO%: 0.7 % (ref 0.0–2.0)
Basophils Absolute: 0 10*3/uL (ref 0.0–0.1)
EOS%: 3.7 % (ref 0.0–7.0)
Eosinophils Absolute: 0.1 10*3/uL (ref 0.0–0.5)
HCT: 42.9 % (ref 34.8–46.6)
HGB: 13.7 g/dL (ref 11.6–15.9)
LYMPH%: 26.3 % (ref 14.0–49.7)
MCH: 29.9 pg (ref 25.1–34.0)
MCHC: 32 g/dL (ref 31.5–36.0)
MCV: 93.4 fL (ref 79.5–101.0)
MONO#: 0.3 10*3/uL (ref 0.1–0.9)
MONO%: 10.4 % (ref 0.0–14.0)
NEUT%: 58.9 % (ref 38.4–76.8)
NEUTROS ABS: 2 10*3/uL (ref 1.5–6.5)
Platelets: 135 10*3/uL — ABNORMAL LOW (ref 145–400)
RBC: 4.59 10*6/uL (ref 3.70–5.45)
RDW: 14.7 % — AB (ref 11.2–14.5)
WBC: 3.3 10*3/uL — AB (ref 3.9–10.3)
lymph#: 0.9 10*3/uL (ref 0.9–3.3)

## 2014-07-17 LAB — POCT INR: INR: 1.8

## 2014-07-17 MED ORDER — FAMOTIDINE IN NACL 20-0.9 MG/50ML-% IV SOLN
INTRAVENOUS | Status: AC
  Filled 2014-07-17: qty 50

## 2014-07-17 MED ORDER — SODIUM CHLORIDE 0.9 % IV SOLN
240.0000 mg | Freq: Once | INTRAVENOUS | Status: AC
  Administered 2014-07-17: 240 mg via INTRAVENOUS
  Filled 2014-07-17: qty 24

## 2014-07-17 MED ORDER — ONDANSETRON 16 MG/50ML IVPB (CHCC): 16.0000 mg | Freq: Once | INTRAVENOUS | Status: DC

## 2014-07-17 MED ORDER — SODIUM CHLORIDE 0.9 % IV SOLN
Freq: Once | INTRAVENOUS | Status: AC
  Administered 2014-07-17: 12:00:00 via INTRAVENOUS

## 2014-07-17 MED ORDER — DEXAMETHASONE SODIUM PHOSPHATE 20 MG/5ML IJ SOLN: 20.0000 mg | Freq: Once | INTRAMUSCULAR | Status: DC

## 2014-07-17 MED ORDER — METHYLPREDNISOLONE SODIUM SUCC 125 MG IJ SOLR
INTRAMUSCULAR | Status: AC
  Filled 2014-07-17: qty 2

## 2014-07-17 MED ORDER — FAMOTIDINE IN NACL 20-0.9 MG/50ML-% IV SOLN
20.0000 mg | Freq: Once | INTRAVENOUS | Status: AC
  Administered 2014-07-17: 20 mg via INTRAVENOUS

## 2014-07-17 MED ORDER — METHYLPREDNISOLONE SODIUM SUCC 125 MG IJ SOLR
125.0000 mg | Freq: Once | INTRAMUSCULAR | Status: AC | PRN
  Administered 2014-07-17: 125 mg via INTRAVENOUS

## 2014-07-17 MED ORDER — DIPHENHYDRAMINE HCL 50 MG/ML IJ SOLN
50.0000 mg | Freq: Once | INTRAMUSCULAR | Status: AC
  Administered 2014-07-17: 50 mg via INTRAVENOUS

## 2014-07-17 MED ORDER — SODIUM CHLORIDE 0.9 % IJ SOLN
10.0000 mL | INTRAMUSCULAR | Status: DC | PRN
  Administered 2014-07-17: 10 mL
  Filled 2014-07-17: qty 10

## 2014-07-17 MED ORDER — SODIUM CHLORIDE 0.9 % IV SOLN
Freq: Once | INTRAVENOUS | Status: AC
  Administered 2014-07-17: 13:00:00 via INTRAVENOUS
  Filled 2014-07-17: qty 8

## 2014-07-17 MED ORDER — DIPHENHYDRAMINE HCL 50 MG/ML IJ SOLN
INTRAMUSCULAR | Status: AC
  Filled 2014-07-17: qty 1

## 2014-07-17 MED ORDER — PACLITAXEL CHEMO INJECTION 300 MG/50ML
60.0000 mg/m2 | Freq: Once | INTRAVENOUS | Status: AC
  Administered 2014-07-17: 120 mg via INTRAVENOUS
  Filled 2014-07-17: qty 20

## 2014-07-17 MED ORDER — HEPARIN SOD (PORK) LOCK FLUSH 100 UNIT/ML IV SOLN
500.0000 [IU] | Freq: Once | INTRAVENOUS | Status: AC | PRN
  Administered 2014-07-17: 500 [IU]
  Filled 2014-07-17: qty 5

## 2014-07-17 NOTE — Progress Notes (Signed)
SYMPTOM MANAGEMENT CLINIC   HPI: [name] 59 y.o. female diagnosed with both breast cancer and ovarian cancer.  Patient is status post right mastectomy and recent hysterectomy performed on 06/27/2014.  Patient continues to take letrozole oral therapy for her breast cancer.  She presents to the [facility] today to receive cycle 1, day 1 of her carboplatin/paclitaxel chemotherapy therapy regimen.  Patient experienced a mild hypersensitivity reaction after receiving approximately 15 minutes of the Taxol infusion today.  Reaction symptoms included facial flushing and scleral erythema.  Patient also was complaining of increased anxiety as well.  Vital signs remained stable throughout.  Confirmed the patient did receive premedications of Benadryl 50 mg, Pepcid 20 mg, Zofran 16 mg, and dexamethasone 20 mg.  Taxol infusion was held to the colon and patient was given Solu-Medrol 125 mg IV.  Patient was monitored for approximately 30 minutes; and all symptoms essentially resolved.  Patient was able to complete all of her chemotherapy as previously directed today.  HPI  ROS  Past Medical History  Diagnosis Date  . Depression   . DVT (deep venous thrombosis)     hx of on HRT left leg ~2006  . GERD (gastroesophageal reflux disease)   . Hyperlipidemia   . Hypertension   . Hypothyroidism   . PPD positive, treated     rx inh   . Diabetes mellitus   . 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 DJD (degenerative joint disease) of lumbar spine   . Chronic diastolic CHF (congestive heart failure)     a. 08/2012 Echo: EF 55-60%, no rwma, mod MR.  [name] A-fib     a. on amio for rate control/coumadin.  [name] Physical deconditioning   . Allergy   . Anemia     low iron hx  . Blood transfusion without reported diagnosis     2 u prbc  . Cataract     removed ou  . OSA on CPAP     cpap 4.5 setting  . Breast cancer 2014    a. 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 Tubal ligation    . Cholecystectomy    . Foot surgery    . Eye surgery    . Cataract extraction    . Percutaneous liver biopsy    . Breast biopsy      left breast  . Mastectomy modified radical  06/08/2012    Procedure: MASTECTOMY MODIFIED RADICAL;  Surgeon: Edward Jolly, MD;  Location: [facility];  Service: General;  Laterality: Right;  . Portacath placement  06/08/2012    Procedure: INSERTION PORT-A-CATH;  Surgeon: Edward Jolly, MD;  Location: [facility];  Service: General;  Laterality: Left;  . Peg tube placement      peg removed 2015  . History of chemotherapy x 2 treatments, radiation tx  2014  . Pac removed  2015  . Laparotomy N/A 06/27/2014    Procedure: EXPLORATORY LAPAROTOMY;  Surgeon: Everitt Amber, MD;  Location: [facility];  Service: Gynecology;  Laterality: N/A;  . Abdominal hysterectomy N/A 06/27/2014    Procedure: HYSTERECTOMY ABDOMINAL TOTAL;  Surgeon: Everitt Amber, MD;  Location: [facility];  Service: Gynecology;  Laterality: N/A;  . Salpingoophorectomy Bilateral 06/27/2014    Procedure: BILATERAL SALPINGO OOPHORECTOMY/TUMOR DEBULKING/LYMPHNODE DISSECTION, OMENTECTOMY;  Surgeon:  Everitt Amber, MD;  Location: [facility];  Service: Gynecology;  Laterality: Bilateral;    has HYPOTHYROIDISM; Diabetes type 2, controlled; Unspecified vitamin D deficiency; HYPERLIPIDEMIA; Morbid obesity; IRON DEFICIENCY; DEPRESSION; OBSTRUCTIVE SLEEP APNEA; HYPERTENSION; GERD; FATTY LIVER DISEASE; ARTHRITIS; SPLENOMEGALY; LIVER FUNCTION TESTS, ABNORMAL; POSITIVE PPD; DVT, HX OF; Liver disease, chronic, with cirrhosis; Allergic rhinitis, cause unspecified; Microcytic anemia; Thrombocytopenia; A-fib; DJD (degenerative joint disease) of lumbar spine; Chronic diastolic CHF (congestive heart failure), NYHA class 1; Benign paroxysmal positional vertigo; Left hip pain; Breast  cancer, right breast; Cough; CAP (community acquired pneumonia); Pelvic mass; Pelvic mass in female; Clear cell carcinoma of ovary, unspecified laterality; Atrial fibrillation, unspecified; Ovarian cancer; and Hypersensitivity reaction on her problem list.    is allergic to olmesartan medoxomil; tetracycline hcl; venlafaxine; and adhesive.    Medication List       This list is accurate as of: 07/17/14  6:32 PM.  Always use your most recent med list.               ALPRAZolam 1 MG 24 hr tablet  Commonly known as:  XANAX XR  Take 1 tablet (1 mg total) by mouth 2 (two) times daily.     buPROPion 75 MG tablet  Commonly known as:  WELLBUTRIN  Take 1 tablet (75 mg total) by mouth 2 (two) times daily.     clonazePAM 0.5 MG tablet  Commonly known as:  KLONOPIN  TAKE 1/2 TABLET TWICE DAILY     dexamethasone 4 MG tablet  Commonly known as:  DECADRON  Take 2 tablets (8 mg total) by mouth 2 (two) times daily with a meal. Start the day after chemotherapy for 3 days.     enoxaparin 120 MG/0.8ML injection  Commonly known as:  LOVENOX  Inject 0.8 mLs (120 mg total) into the skin daily.     escitalopram 20 MG tablet  Commonly known as:  LEXAPRO  Take 1 tablet (20 mg total) by mouth daily.     furosemide 20 MG tablet  Commonly known as:  LASIX  Take 1 tablet (20 mg total) by mouth daily.     letrozole 2.5 MG tablet  Commonly known as:  FEMARA  Take 1 tablet (2.5 mg total) by mouth daily.     levothyroxine 150 MCG tablet  Commonly known as:  SYNTHROID, LEVOTHROID  Take 1 tablet (150 mcg total) by mouth daily.     lidocaine-prilocaine cream  Commonly known as:  EMLA  Apply over port area 1-2 hours before chemotherapy     LORazepam 0.5 MG tablet  Commonly known as:  ATIVAN  Take 1 tablet (0.5 mg total) by mouth at bedtime as needed for anxiety.     metFORMIN 500 MG tablet  Commonly known as:  GLUCOPHAGE  Take 1 tablet (500 mg total) by mouth 2 (two) times daily with a meal.      metoprolol tartrate 25 MG tablet  Commonly known as:  LOPRESSOR  Take 1 tablet (25 mg total) by mouth 2 (two) times daily.     OLANZapine 2.5 MG tablet  Commonly known as:  ZYPREXA  TAKE 1 TABLET NIGHTLY AT BEDTIME AS NEEDED FOR SLEEP OR anxiety     omeprazole 20 MG capsule  Commonly known as:  PRILOSEC  1 tab po bid     ondansetron 8 MG tablet  Commonly known as:  ZOFRAN  Take 1 tablet (8 mg total) by mouth 2 (two) times daily. Start the day after chemo for 3  days. Then take as needed for nausea or vomiting.     potassium chloride 10 MEQ tablet  Commonly known as:  K-DUR  Take 10 mEq by mouth 2 (two) times daily.     prochlorperazine 10 MG tablet  Commonly known as:  COMPAZINE  Take 1 tablet (10 mg total) by mouth every 6 (six) hours as needed (Nausea or vomiting).     spironolactone 25 MG tablet  Commonly known as:  ALDACTONE  Take 1 tablet (25 mg total) by mouth daily.     warfarin 3 MG tablet  Commonly known as:  COUMADIN  Take 1-2 tablets (3-6 mg total) by mouth daily. as directed by coumadin clinic         PHYSICAL EXAMINATION  Oncology Vitals 07/17/2014 07/17/2014 07/17/2014 07/17/2014 07/17/2014 07/17/2014 07/14/2014  Height - - - - - - 163 cm  Weight - - - - - - 80.876 kg  Weight (lbs) - - - - - - 178 lbs 5 oz  BMI (kg/m2) - - - - - - 30.6 kg/m2  Temp 98.2 98 97.7 98.2 98.2 98.1 98.1  Pulse 80 80 78 83 78 77 89  Resp '16 16 16 16 18 18 18  ' SpO2 - 97 100 100 99 100 -  BSA (m2) - - - - - - 1.91 m2   BP Readings from Last 3 Encounters:  07/17/14 116/58  07/14/14 148/71  07/10/14 144/78    Physical Exam  Constitutional: She is oriented to person, place, and time. Vital signs are normal. She appears unhealthy.  HENT:  Head: Normocephalic and atraumatic.  Mouth/Throat: Oropharynx is clear and moist.  Eyes: Conjunctivae and EOM are normal. Pupils are equal, round, and reactive to light. Right eye exhibits no discharge. Left eye exhibits no discharge. No scleral icterus.    Patient with moderate bilateral scleral erythema on initial exam.  Following administration of Solu-Medrol IV-scleral erythema did essentially clear.  Patient denied any vision changes.  Also, there was no discharge or excessive tearing of eyes.  Neck: Normal range of motion. Neck supple. No JVD present. No tracheal deviation present. No thyromegaly present.  Cardiovascular: Normal rate, regular rhythm, normal heart sounds and intact distal pulses.   Pulmonary/Chest: Effort normal and breath sounds normal. No stridor. No respiratory distress. She has no wheezes. She has no rales. She exhibits no tenderness.  Abdominal: Soft. Bowel sounds are normal. She exhibits no distension and no mass. There is no tenderness. There is no rebound and no guarding.  Musculoskeletal: Normal range of motion. She exhibits no edema or tenderness.  Lymphadenopathy:    She has no cervical adenopathy.  Neurological: She is alert and oriented to person, place, and time.  Skin: Skin is warm. No rash noted. There is erythema. No pallor.  Patient had some moderate facial flushing that essentially resolved with administration of Solu-Medrol IV.  Psychiatric:  Patient appeared somewhat anxious on exam.  Nursing note and vitals reviewed.   LABORATORY DATA:. Anti-coag visit on 07/17/2014  Component Date Value Ref Range Status  . INR 07/17/2014 1.8   Final  Appointment on 07/17/2014  Component Date Value Ref Range Status  . WBC 07/17/2014 3.3* 3.9 - 10.3 10e3/uL Final  . NEUT# 07/17/2014 2.0  1.5 - 6.5 10e3/uL Final  . HGB 07/17/2014 13.7  11.6 - 15.9 g/dL Final  . HCT 07/17/2014 42.9  34.8 - 46.6 % Final  . Platelets 07/17/2014 135* 145 - 400 10e3/uL Final  . MCV 07/17/2014  93.4  79.5 - 101.0 fL Final  . MCH 07/17/2014 29.9  25.1 - 34.0 pg Final  . MCHC 07/17/2014 32.0  31.5 - 36.0 g/dL Final  . RBC 07/17/2014 4.59  3.70 - 5.45 10e6/uL Final  . RDW 07/17/2014 14.7* 11.2 - 14.5 % Final  . lymph# 07/17/2014 0.9  0.9  - 3.3 10e3/uL Final  . MONO# 07/17/2014 0.3  0.1 - 0.9 10e3/uL Final  . Eosinophils Absolute 07/17/2014 0.1  0.0 - 0.5 10e3/uL Final  . Basophils Absolute 07/17/2014 0.0  0.0 - 0.1 10e3/uL Final  . NEUT% 07/17/2014 58.9  38.4 - 76.8 % Final  . LYMPH% 07/17/2014 26.3  14.0 - 49.7 % Final  . MONO% 07/17/2014 10.4  0.0 - 14.0 % Final  . EOS% 07/17/2014 3.7  0.0 - 7.0 % Final  . BASO% 07/17/2014 0.7  0.0 - 2.0 % Final  . Protime 07/17/2014 21.6* 10.6 - 13.4 Seconds Final  . INR 07/17/2014 1.80* 2.00 - 3.50 Final   Comment: INR is useful only to assess adequacy of anticoagulation with coumadin when comparing results from different labs. It should not be used to estimate bleeding risk or presence/abscense of coagulopathy in patients not on coumadin. Expected INR ranges for  nontherapeutic patients is 0.88 - 1.12.   [name] Lovenox 07/17/2014 Yes   Final  . Sodium 07/17/2014 141  136 - 145 mEq/L Final  . Potassium 07/17/2014 3.8  3.5 - 5.1 mEq/L Final  . Chloride 07/17/2014 104  98 - 109 mEq/L Final  . CO2 07/17/2014 23  22 - 29 mEq/L Final  . Glucose 07/17/2014 103  70 - 140 mg/dl Final  . BUN 07/17/2014 6.5* 7.0 - 26.0 mg/dL Final  . Creatinine 07/17/2014 0.7  0.6 - 1.1 mg/dL Final  . Total Bilirubin 07/17/2014 1.18  0.20 - 1.20 mg/dL Final  . Alkaline Phosphatase 07/17/2014 144  40 - 150 U/L Final  . AST 07/17/2014 67* 5 - 34 U/L Final  . ALT 07/17/2014 32  0 - 55 U/L Final  . Total Protein 07/17/2014 7.4  6.4 - 8.3 g/dL Final  . Albumin 07/17/2014 3.6  3.5 - 5.0 g/dL Final  . Calcium 07/17/2014 9.6  8.4 - 10.4 mg/dL Final  . Anion Gap 07/17/2014 13* 3 - 11 mEq/L Final  . EGFR 07/17/2014 >90  >90 ml/min/1.73 m2 Final   eGFR is calculated using the CKD-EPI Creatinine Equation (2009)     RADIOGRAPHIC STUDIES: No results found.  ASSESSMENT/PLAN:    Breast cancer, right breast Patient is status post right mastectomy in the past.  She is currently undergoing letrozole oral therapy on a daily  basis.  She appears to be tolerating the letrozole fairly well.   Clear cell carcinoma of ovary, unspecified laterality Patient underwent a complete hysterectomy on 07/26/2014 per Dr. Denman George.  She presented to the [facility] today to receive cycle 1, day 1 of her carboplatin/paclitaxel chemotherapy regimen.  She did experience a mild hypersensitivity reaction to the Taxol portion of her chemotherapy today.  The reaction was managed per hypersensitivity protocol; and patient was able to complete all of her chemotherapy as previously directed.  Patient will return for a follow-up visit on 07/21/2014.  She'll return on 07/24/2014 for her next cycle of chemotherapy.   Hypersensitivity reaction Patient presented to the [facility] today to receive cycle 1, day 1 of her carboplatin/paclitaxel chemotherapy regimen.  She developed a mild hypersensitivity reaction to the Taxol portion of her chemotherapy today; which  consisted of some facial flushing, bilateral scleral erythema, and increased anxiety.  Taxol infusion was held.  Confirmed the patient was premedicated with Benadryl 50 mg, Pepcid 20 mg, Zofran 16 mg, and dexamethasone 20 mg.  Patient was given Solu-Medrol 125 mg IV per hypersensitivity protocol.  Patient was monitored for approximately 30 minutes; and all symptoms essentially resolved.  Vital signs remained normal throughout.  Patient was able to resume and complete all of her chemotherapy directed.   Patient stated understanding of all instructions; and was in agreement with this plan of care. The patient knows to call the clinic with any problems, questions or concerns.   Review/collaboration with Dr. Jana Hakim regarding all aspects of patient's visit today.   Total time spent with patient was 25 minutes;  with greater than 75 percent of that time spent in face to face counseling regarding patient's symptoms,  and coordination of care and follow up.  Disclaimer: This note was dictated  with voice recognition software. Similar sounding words can inadvertently be transcribed and may not be corrected upon review.   Drue Second, NP 07/17/2014

## 2014-07-17 NOTE — Telephone Encounter (Signed)
Per staff message and POF I have scheduled appts. Advised scheduler of appts. JMW  

## 2014-07-17 NOTE — Progress Notes (Signed)
Taxol increased to 3/4 goal rate.

## 2014-07-17 NOTE — Assessment & Plan Note (Signed)
Patient is status post right mastectomy in the past.  She is currently undergoing letrozole oral therapy on a daily basis.  She appears to be tolerating the letrozole fairly well.

## 2014-07-17 NOTE — Progress Notes (Signed)
1st taxol starting at this time.

## 2014-07-17 NOTE — Assessment & Plan Note (Signed)
Patient presented to the [facility] today to receive cycle 1, day 1 of her carboplatin/paclitaxel chemotherapy regimen.  She developed a mild hypersensitivity reaction to the Taxol portion of her chemotherapy today; which consisted of some facial flushing, bilateral scleral erythema, and increased anxiety.  Taxol infusion was held.  Confirmed the patient was premedicated with Benadryl 50 mg, Pepcid 20 mg, Zofran 16 mg, and dexamethasone 20 mg.  Patient was given Solu-Medrol 125 mg IV per hypersensitivity protocol.  Patient was monitored for approximately 30 minutes; and all symptoms essentially resolved.  Vital signs remained normal throughout.  Patient was able to resume and complete all of her chemotherapy directed.

## 2014-07-17 NOTE — Patient Instructions (Signed)
[facility] Discharge Instructions for Patients Receiving Chemotherapy  Today you received the following chemotherapy agents Taxol, Carboplatin  To help prevent nausea and vomiting after your treatment, we encourage you to take your nausea medication compazine 10 mg, zofran 8 mg as ordered.   If you develop nausea and vomiting that is not controlled by your nausea medication, call the clinic.   BELOW ARE SYMPTOMS THAT SHOULD BE REPORTED IMMEDIATELY:  *FEVER GREATER THAN 100.5 F  *CHILLS WITH OR WITHOUT FEVER  NAUSEA AND VOMITING THAT IS NOT CONTROLLED WITH YOUR NAUSEA MEDICATION  *UNUSUAL SHORTNESS OF BREATH  *UNUSUAL BRUISING OR BLEEDING  TENDERNESS IN MOUTH AND THROAT WITH OR WITHOUT PRESENCE OF ULCERS  *URINARY PROBLEMS  *BOWEL PROBLEMS  UNUSUAL RASH Items with * indicate a potential emergency and should be followed up as soon as possible.  Feel free to call the clinic you have any questions or concerns. The clinic phone number is [phone].   Carboplatin injection What is this medicine? CARBOPLATIN (KAR boe pla tin) is a chemotherapy drug. It targets fast dividing cells, like cancer cells, and causes these cells to die. This medicine is used to treat ovarian cancer and many other cancers. This medicine may be used for other purposes; ask your health care provider or pharmacist if you have questions. COMMON BRAND NAME(S): Paraplatin What should I tell my health care provider before I take this medicine? They need to know if you have any of these conditions: -blood disorders -hearing problems -kidney disease -recent or ongoing radiation therapy -an unusual or allergic reaction to carboplatin, cisplatin, other chemotherapy, other medicines, foods, dyes, or preservatives -pregnant or trying to get pregnant -breast-feeding How should I use this medicine? This drug is usually given as an infusion into a vein. It is administered in a hospital or clinic  by a specially trained health care professional. Talk to your pediatrician regarding the use of this medicine in children. Special care may be needed. Overdosage: If you think you have taken too much of this medicine contact a poison control center or emergency room at once. NOTE: This medicine is only for you. Do not share this medicine with others. What if I miss a dose? It is important not to miss a dose. Call your doctor or health care professional if you are unable to keep an appointment. What may interact with this medicine? -medicines for seizures -medicines to increase blood counts like filgrastim, pegfilgrastim, sargramostim -some antibiotics like amikacin, gentamicin, neomycin, streptomycin, tobramycin -vaccines Talk to your doctor or health care professional before taking any of these medicines: -acetaminophen -aspirin -ibuprofen -ketoprofen -naproxen This list may not describe all possible interactions. Give your health care provider a list of all the medicines, herbs, non-prescription drugs, or dietary supplements you use. Also tell them if you smoke, drink alcohol, or use illegal drugs. Some items may interact with your medicine. What should I watch for while using this medicine? Your condition will be monitored carefully while you are receiving this medicine. You will need important blood work done while you are taking this medicine. This drug may make you feel generally unwell. This is not uncommon, as chemotherapy can affect healthy cells as well as cancer cells. Report any side effects. Continue your course of treatment even though you feel ill unless your doctor tells you to stop. In some cases, you may be given additional medicines to help with side effects. Follow all directions for their use. Call your doctor or health  care professional for advice if you get a fever, chills or sore throat, or other symptoms of a cold or flu. Do not treat yourself. This drug decreases your  body's ability to fight infections. Try to avoid being around people who are sick. This medicine may increase your risk to bruise or bleed. Call your doctor or health care professional if you notice any unusual bleeding. Be careful brushing and flossing your teeth or using a toothpick because you may get an infection or bleed more easily. If you have any dental work done, tell your dentist you are receiving this medicine. Avoid taking products that contain aspirin, acetaminophen, ibuprofen, naproxen, or ketoprofen unless instructed by your doctor. These medicines may hide a fever. Do not become pregnant while taking this medicine. Women should inform their doctor if they wish to become pregnant or think they might be pregnant. There is a potential for serious side effects to an unborn child. Talk to your health care professional or pharmacist for more information. Do not breast-feed an infant while taking this medicine. What side effects may I notice from receiving this medicine? Side effects that you should report to your doctor or health care professional as soon as possible: -allergic reactions like skin rash, itching or hives, swelling of the face, lips, or tongue -signs of infection - fever or chills, cough, sore throat, pain or difficulty passing urine -signs of decreased platelets or bleeding - bruising, pinpoint red spots on the skin, black, tarry stools, nosebleeds -signs of decreased red blood cells - unusually weak or tired, fainting spells, lightheadedness -breathing problems -changes in hearing -changes in vision -chest pain -high blood pressure -low blood counts - This drug may decrease the number of white blood cells, red blood cells and platelets. You may be at increased risk for infections and bleeding. -nausea and vomiting -pain, swelling, redness or irritation at the injection site -pain, tingling, numbness in the hands or feet -problems with balance, talking, walking -trouble  passing urine or change in the amount of urine Side effects that usually do not require medical attention (report to your doctor or health care professional if they continue or are bothersome): -hair loss -loss of appetite -metallic taste in the mouth or changes in taste This list may not describe all possible side effects. Call your doctor for medical advice about side effects. You may report side effects to FDA at 1-800-FDA-1088. Where should I keep my medicine? This drug is given in a hospital or clinic and will not be stored at home. NOTE: This sheet is a summary. It may not cover all possible information. If you have questions about this medicine, talk to your doctor, pharmacist, or health care provider.  2015, [employer]/Gold Standard. (2007-08-03 14:38:05) Paclitaxel injection What is this medicine? PACLITAXEL (PAK li TAX el) is a chemotherapy drug. It targets fast dividing cells, like cancer cells, and causes these cells to die. This medicine is used to treat ovarian cancer, breast cancer, and other cancers. This medicine may be used for other purposes; ask your health care provider or pharmacist if you have questions. COMMON BRAND NAME(S): Onxol, Taxol What should I tell my health care provider before I take this medicine? They need to know if you have any of these conditions: -blood disorders -irregular heartbeat -infection (especially a virus infection such as chickenpox, cold sores, or herpes) -liver disease -previous or ongoing radiation therapy -an unusual or allergic reaction to paclitaxel, alcohol, polyoxyethylated castor oil, other chemotherapy agents, other medicines, foods,  dyes, or preservatives -pregnant or trying to get pregnant -breast-feeding How should I use this medicine? This drug is given as an infusion into a vein. It is administered in a hospital or clinic by a specially trained health care professional. Talk to your pediatrician regarding the use of this  medicine in children. Special care may be needed. Overdosage: If you think you have taken too much of this medicine contact a poison control center or emergency room at once. NOTE: This medicine is only for you. Do not share this medicine with others. What if I miss a dose? It is important not to miss your dose. Call your doctor or health care professional if you are unable to keep an appointment. What may interact with this medicine? Do not take this medicine with any of the following medications: -disulfiram -metronidazole This medicine may also interact with the following medications: -cyclosporine -diazepam -ketoconazole -medicines to increase blood counts like filgrastim, pegfilgrastim, sargramostim -other chemotherapy drugs like cisplatin, doxorubicin, epirubicin, etoposide, teniposide, vincristine -quinidine -testosterone -vaccines -verapamil Talk to your doctor or health care professional before taking any of these medicines: -acetaminophen -aspirin -ibuprofen -ketoprofen -naproxen This list may not describe all possible interactions. Give your health care provider a list of all the medicines, herbs, non-prescription drugs, or dietary supplements you use. Also tell them if you smoke, drink alcohol, or use illegal drugs. Some items may interact with your medicine. What should I watch for while using this medicine? Your condition will be monitored carefully while you are receiving this medicine. You will need important blood work done while you are taking this medicine. This drug may make you feel generally unwell. This is not uncommon, as chemotherapy can affect healthy cells as well as cancer cells. Report any side effects. Continue your course of treatment even though you feel ill unless your doctor tells you to stop. In some cases, you may be given additional medicines to help with side effects. Follow all directions for their use. Call your doctor or health care professional  for advice if you get a fever, chills or sore throat, or other symptoms of a cold or flu. Do not treat yourself. This drug decreases your body's ability to fight infections. Try to avoid being around people who are sick. This medicine may increase your risk to bruise or bleed. Call your doctor or health care professional if you notice any unusual bleeding. Be careful brushing and flossing your teeth or using a toothpick because you may get an infection or bleed more easily. If you have any dental work done, tell your dentist you are receiving this medicine. Avoid taking products that contain aspirin, acetaminophen, ibuprofen, naproxen, or ketoprofen unless instructed by your doctor. These medicines may hide a fever. Do not become pregnant while taking this medicine. Women should inform their doctor if they wish to become pregnant or think they might be pregnant. There is a potential for serious side effects to an unborn child. Talk to your health care professional or pharmacist for more information. Do not breast-feed an infant while taking this medicine. Men are advised not to father a child while receiving this medicine. What side effects may I notice from receiving this medicine? Side effects that you should report to your doctor or health care professional as soon as possible: -allergic reactions like skin rash, itching or hives, swelling of the face, lips, or tongue -low blood counts - This drug may decrease the number of white blood cells, red blood cells and  platelets. You may be at increased risk for infections and bleeding. -signs of infection - fever or chills, cough, sore throat, pain or difficulty passing urine -signs of decreased platelets or bleeding - bruising, pinpoint red spots on the skin, black, tarry stools, nosebleeds -signs of decreased red blood cells - unusually weak or tired, fainting spells, lightheadedness -breathing problems -chest pain -high or low blood pressure -mouth  sores -nausea and vomiting -pain, swelling, redness or irritation at the injection site -pain, tingling, numbness in the hands or feet -slow or irregular heartbeat -swelling of the ankle, feet, hands Side effects that usually do not require medical attention (report to your doctor or health care professional if they continue or are bothersome): -bone pain -complete hair loss including hair on your head, underarms, pubic hair, eyebrows, and eyelashes -changes in the color of fingernails -diarrhea -loosening of the fingernails -loss of appetite -muscle or joint pain -red flush to skin -sweating This list may not describe all possible side effects. Call your doctor for medical advice about side effects. You may report side effects to FDA at 1-800-FDA-1088. Where should I keep my medicine? This drug is given in a hospital or clinic and will not be stored at home. NOTE: This sheet is a summary. It may not cover all possible information. If you have questions about this medicine, talk to your doctor, pharmacist, or health care provider.  2015, [employer]vier/Gold Standard. (2012-06-21 16:41:21)

## 2014-07-17 NOTE — Assessment & Plan Note (Signed)
Patient underwent a complete hysterectomy on 07/26/2014 per Dr. Denman George.  She presented to the [facility] today to receive cycle 1, day 1 of her carboplatin/paclitaxel chemotherapy regimen.  She did experience a mild hypersensitivity reaction to the Taxol portion of her chemotherapy today.  The reaction was managed per hypersensitivity protocol; and patient was able to complete all of her chemotherapy as previously directed.  Patient will return for a follow-up visit on 07/21/2014.  She'll return on 07/24/2014 for her next cycle of chemotherapy.

## 2014-07-17 NOTE — Progress Notes (Signed)
INR approaching goal today at 1.8 Per last note, ok to stop lovenox if INR increasing ant >/= 1.8 Ms. Kabel is glad she can stop the lovenox shots She reports only minor bruising with no bleeding  No missed or extra doses No diet or medication changes Will give a slight boost today to make sure INR reaches goal Plan: Stop Lovenox.  Take 6 mg (2 tabs today only)  Then Restart Coumadin 6 mg/day except 3 mg on Mon/Wed/Fri We will see you on 07/24/14 in the chemo room w/ INR results. Lab at 11:45am

## 2014-07-17 NOTE — Progress Notes (Signed)
Increased to goal rate of 270 ml/hr

## 2014-07-17 NOTE — Progress Notes (Signed)
Patient ready for increase of Taxol.  Asked to go to the bathroom.  VSS checked before walked to restroom.  Denies any changes in status.  Rate increased.

## 2014-07-17 NOTE — Progress Notes (Signed)
1417 returned to chair from bathroom.  Face flushed, slightly nauseated and eyes red.  Denies pain.  Taxol stopped.  [facility].  Solumedrol given at 1441.  Selena Lesser re-assessed at 1507.  Patient looks and feels better.  "I have panic attacks all the time."  Verbal order received and read back to resume taxol. 1515 patient color has returned to normal and eyes look much better.  No distress.

## 2014-07-17 NOTE — Patient Instructions (Signed)
INR approaching goal Stop Lovenox.  Take 6 mg (2 tabs today only)  Then Restart Coumadin 6 mg/day except 3 mg on Mon/Wed/Fri We will see you on 07/24/14 in the chemo room w/ INR results. Lab at 11:45am

## 2014-07-18 ENCOUNTER — Telehealth: Payer: Self-pay | Admitting: Oncology

## 2014-07-18 ENCOUNTER — Telehealth: Payer: Self-pay

## 2014-07-18 ENCOUNTER — Telehealth: Payer: Self-pay | Admitting: *Deleted

## 2014-07-18 NOTE — Telephone Encounter (Signed)
Patient states that she is feeling much better today.  States that she is eating and drinking well and reports no signs or symptoms of distress.  Patient told to call office if she has any fevers or worsening symptoms.  Verbalized understanding.

## 2014-07-18 NOTE — Telephone Encounter (Signed)
per Gerald Stabs in [place] to add coum-pt aware-will see in inf

## 2014-07-18 NOTE — Telephone Encounter (Signed)
Spoke with husband. States she did very well after getting home, ate 2 tacos and is eating [place] this morning. Denies N/V or diarrhea. States she did not have any further facial flushing or blood shot eyes. Will call if has any questions or concerns

## 2014-07-18 NOTE — Telephone Encounter (Signed)
-----   Message from Cherylynn Ridges, RN sent at 07/17/2014  8:04 PM EST ----- Regarding: Chemotherapy F/U Contact: [phone] Dr. Jana Hakim  1st Taxol since 2014 for breast, now ovarian and Carboplatin received as well.  Adverse reaction within 1st 15 minutes of taxol with flushed face and blood shot eyes.

## 2014-07-21 ENCOUNTER — Ambulatory Visit: Payer: Self-pay | Admitting: Nurse Practitioner

## 2014-07-24 ENCOUNTER — Other Ambulatory Visit: Payer: Self-pay

## 2014-07-24 ENCOUNTER — Telehealth: Payer: Self-pay | Admitting: Oncology

## 2014-07-24 ENCOUNTER — Other Ambulatory Visit (HOSPITAL_BASED_OUTPATIENT_CLINIC_OR_DEPARTMENT_OTHER): Payer: BLUE CROSS/BLUE SHIELD

## 2014-07-24 ENCOUNTER — Ambulatory Visit (HOSPITAL_BASED_OUTPATIENT_CLINIC_OR_DEPARTMENT_OTHER): Payer: BLUE CROSS/BLUE SHIELD | Admitting: Oncology

## 2014-07-24 ENCOUNTER — Ambulatory Visit (HOSPITAL_BASED_OUTPATIENT_CLINIC_OR_DEPARTMENT_OTHER): Payer: Self-pay | Admitting: Pharmacist

## 2014-07-24 ENCOUNTER — Ambulatory Visit (HOSPITAL_BASED_OUTPATIENT_CLINIC_OR_DEPARTMENT_OTHER): Payer: BLUE CROSS/BLUE SHIELD

## 2014-07-24 VITALS — BP 137/81 | HR 80 | Temp 98.4°F | Resp 19 | Ht 64.0 in | Wt 173.5 lb

## 2014-07-24 DIAGNOSIS — C50911 Malignant neoplasm of unspecified site of right female breast: Secondary | ICD-10-CM

## 2014-07-24 DIAGNOSIS — I4891 Unspecified atrial fibrillation: Secondary | ICD-10-CM

## 2014-07-24 DIAGNOSIS — C562 Malignant neoplasm of left ovary: Secondary | ICD-10-CM

## 2014-07-24 DIAGNOSIS — I482 Chronic atrial fibrillation, unspecified: Secondary | ICD-10-CM

## 2014-07-24 DIAGNOSIS — E119 Type 2 diabetes mellitus without complications: Secondary | ICD-10-CM

## 2014-07-24 DIAGNOSIS — C569 Malignant neoplasm of unspecified ovary: Secondary | ICD-10-CM

## 2014-07-24 DIAGNOSIS — Z86718 Personal history of other venous thrombosis and embolism: Secondary | ICD-10-CM

## 2014-07-24 DIAGNOSIS — R945 Abnormal results of liver function studies: Secondary | ICD-10-CM

## 2014-07-24 DIAGNOSIS — C50111 Malignant neoplasm of central portion of right female breast: Secondary | ICD-10-CM

## 2014-07-24 DIAGNOSIS — Z5189 Encounter for other specified aftercare: Secondary | ICD-10-CM

## 2014-07-24 DIAGNOSIS — C773 Secondary and unspecified malignant neoplasm of axilla and upper limb lymph nodes: Secondary | ICD-10-CM

## 2014-07-24 DIAGNOSIS — I5032 Chronic diastolic (congestive) heart failure: Secondary | ICD-10-CM

## 2014-07-24 DIAGNOSIS — Z5111 Encounter for antineoplastic chemotherapy: Secondary | ICD-10-CM

## 2014-07-24 DIAGNOSIS — M8580 Other specified disorders of bone density and structure, unspecified site: Secondary | ICD-10-CM

## 2014-07-24 LAB — CBC WITH DIFFERENTIAL/PLATELET
BASO%: 0.5 % (ref 0.0–2.0)
Basophils Absolute: 0 10*3/uL (ref 0.0–0.1)
EOS ABS: 0 10*3/uL (ref 0.0–0.5)
EOS%: 1.5 % (ref 0.0–7.0)
HCT: 43.5 % (ref 34.8–46.6)
HGB: 14 g/dL (ref 11.6–15.9)
LYMPH#: 0.9 10*3/uL (ref 0.9–3.3)
LYMPH%: 43.8 % (ref 14.0–49.7)
MCH: 29.7 pg (ref 25.1–34.0)
MCHC: 32.2 g/dL (ref 31.5–36.0)
MCV: 92.2 fL (ref 79.5–101.0)
MONO#: 0 10*3/uL — AB (ref 0.1–0.9)
MONO%: 2.1 % (ref 0.0–14.0)
NEUT%: 52.1 % (ref 38.4–76.8)
NEUTROS ABS: 1.1 10*3/uL — AB (ref 1.5–6.5)
Platelets: 119 10*3/uL — ABNORMAL LOW (ref 145–400)
RBC: 4.72 10*6/uL (ref 3.70–5.45)
RDW: 14.7 % — AB (ref 11.2–14.5)
WBC: 2.2 10*3/uL — AB (ref 3.9–10.3)

## 2014-07-24 LAB — COMPREHENSIVE METABOLIC PANEL (CC13)
ALT: 86 U/L — ABNORMAL HIGH (ref 0–55)
ANION GAP: 10 meq/L (ref 3–11)
AST: 70 U/L — ABNORMAL HIGH (ref 5–34)
Albumin: 3.3 g/dL — ABNORMAL LOW (ref 3.5–5.0)
Alkaline Phosphatase: 120 U/L (ref 40–150)
BUN: 9.9 mg/dL (ref 7.0–26.0)
CALCIUM: 9.1 mg/dL (ref 8.4–10.4)
CO2: 22 meq/L (ref 22–29)
Chloride: 105 mEq/L (ref 98–109)
Creatinine: 0.7 mg/dL (ref 0.6–1.1)
EGFR: >90 mL/min/{1.73_m2} (ref >90)
GLUCOSE: 117 mg/dL (ref 70–140)
POTASSIUM: 4 meq/L (ref 3.5–5.1)
Sodium: 138 mEq/L (ref 136–145)
Total Bilirubin: 1.67 mg/dL — ABNORMAL HIGH (ref 0.20–1.20)
Total Protein: 6.8 g/dL (ref 6.4–8.3)

## 2014-07-24 LAB — PROTIME-INR
INR: 2.9 (ref 2.00–3.50)
PROTIME: 34.8 s — AB (ref 10.6–13.4)

## 2014-07-24 LAB — POCT INR: INR: 2.9

## 2014-07-24 MED ORDER — SODIUM CHLORIDE 0.9 % IV SOLN
Freq: Once | INTRAVENOUS | Status: AC
  Administered 2014-07-24: 13:00:00 via INTRAVENOUS

## 2014-07-24 MED ORDER — FAMOTIDINE IN NACL 20-0.9 MG/50ML-% IV SOLN
20.0000 mg | Freq: Once | INTRAVENOUS | Status: AC
  Administered 2014-07-24: 20 mg via INTRAVENOUS

## 2014-07-24 MED ORDER — DIPHENHYDRAMINE HCL 50 MG/ML IJ SOLN
INTRAMUSCULAR | Status: AC
  Filled 2014-07-24: qty 1

## 2014-07-24 MED ORDER — CARBOPLATIN CHEMO INJECTION 450 MG/45ML
230.0000 mg | Freq: Once | INTRAVENOUS | Status: AC
  Administered 2014-07-24: 230 mg via INTRAVENOUS
  Filled 2014-07-24: qty 23

## 2014-07-24 MED ORDER — SODIUM CHLORIDE 0.9 % IJ SOLN
10.0000 mL | INTRAMUSCULAR | Status: DC | PRN
  Administered 2014-07-24: 10 mL
  Filled 2014-07-24: qty 10

## 2014-07-24 MED ORDER — SODIUM CHLORIDE 0.9 % IV SOLN
Freq: Once | INTRAVENOUS | Status: AC
  Administered 2014-07-24: 13:00:00 via INTRAVENOUS
  Filled 2014-07-24: qty 8

## 2014-07-24 MED ORDER — PEGFILGRASTIM 6 MG/0.6ML ~~LOC~~ PSKT
6.0000 mg | PREFILLED_SYRINGE | Freq: Once | SUBCUTANEOUS | Status: AC
  Administered 2014-07-24: 6 mg via SUBCUTANEOUS
  Filled 2014-07-24: qty 0.6

## 2014-07-24 MED ORDER — DIPHENHYDRAMINE HCL 50 MG/ML IJ SOLN
50.0000 mg | Freq: Once | INTRAMUSCULAR | Status: AC
  Administered 2014-07-24: 50 mg via INTRAVENOUS

## 2014-07-24 MED ORDER — FAMOTIDINE IN NACL 20-0.9 MG/50ML-% IV SOLN
INTRAVENOUS | Status: AC
  Filled 2014-07-24: qty 50

## 2014-07-24 MED ORDER — PACLITAXEL CHEMO INJECTION 300 MG/50ML
60.0000 mg/m2 | Freq: Once | INTRAVENOUS | Status: AC
  Administered 2014-07-24: 120 mg via INTRAVENOUS
  Filled 2014-07-24: qty 20

## 2014-07-24 MED ORDER — HEPARIN SOD (PORK) LOCK FLUSH 100 UNIT/ML IV SOLN
500.0000 [IU] | Freq: Once | INTRAVENOUS | Status: AC | PRN
  Administered 2014-07-24: 500 [IU]
  Filled 2014-07-24: qty 5

## 2014-07-24 NOTE — Patient Instructions (Addendum)
INR at goal Continue Coumadin 6 mg/day except 3 mg on Mon/Wed/Fri We will see you on 07/31/14 2:15 lab, 2:45pm with Heather. IV fluids at 3:30pm and we will see you in infusion

## 2014-07-24 NOTE — Telephone Encounter (Signed)
per Gerald Stabs in [place] pt CC-pt aware

## 2014-07-24 NOTE — Progress Notes (Signed)
INR at goal with INR 2.9 today (Goal 2-3) Pt seen in infusion area during chemo Pt is fatigued and a little "down" per her husband He will try to get her out of the house this week if he can Dr. Jana Hakim has add some steroids for her to help with this as well as appetite We will watch her INR closely to see if this causes an increase For now will make no changes No missed or extra doses No other diet or medication changes No unusual bleeding or bruising Plan: Continue Coumadin 6 mg/day except 3 mg on Mon/Wed/Fri We will see you on 07/31/14 2:15 lab, 2:45pm with Heather. IV fluids at 3:30pm and we will see you in infusion

## 2014-07-24 NOTE — Progress Notes (Unsigned)
Ok to treat with neutrophils 1.1 and elevated liver enzymes per Dr. Jana Hakim.  Pt to receive On Pro

## 2014-07-24 NOTE — Patient Instructions (Signed)
[facility] Discharge Instructions for Patients Receiving Chemotherapy  Today you received the following chemotherapy agents taxol/carboplatin  To help prevent nausea and vomiting after your treatment, we encourage you to take your nausea medication as directed   If you develop nausea and vomiting that is not controlled by your nausea medication, call the clinic.   BELOW ARE SYMPTOMS THAT SHOULD BE REPORTED IMMEDIATELY:  *FEVER GREATER THAN 100.5 F  *CHILLS WITH OR WITHOUT FEVER  NAUSEA AND VOMITING THAT IS NOT CONTROLLED WITH YOUR NAUSEA MEDICATION  *UNUSUAL SHORTNESS OF BREATH  *UNUSUAL BRUISING OR BLEEDING  TENDERNESS IN MOUTH AND THROAT WITH OR WITHOUT PRESENCE OF ULCERS  *URINARY PROBLEMS  *BOWEL PROBLEMS  UNUSUAL RASH Items with * indicate a potential emergency and should be followed up as soon as possible.  Feel free to call the clinic you have any questions or concerns. The clinic phone number is [phone].  

## 2014-07-24 NOTE — Telephone Encounter (Signed)
Added appts per 3/14 pof. No tx 3/21 - IVF's only. Pt given avs/schedule in infusion.

## 2014-07-24 NOTE — Progress Notes (Signed)
ID: [name]   DOB: 30-Nov-1955  MR#: [id number]  GEZ#:[id number]  PCP: [name], NP GYN:  SU: [facility] OTHER YT:KPTWSF [name], Shanon Ace, Roney Jaffe  CHIEF COMPLAINT:  Estrogen receptor positive Right Breast Cancer  CURRENT TREATMENT: Letrozole; carboplatin/ paclitaxel   BREAST CANCER HISTORY: From the original consult note:  [name] noted a mass in her right breast mid December 2013, and as it did not spontaneously resolve over a couple of weeks she brought it to her primary physician's attention. She was set up for diagnostic mammography and right breast ultrasonography at the breast Center 05/10/2012. (Note that the patient's most recent prior mammography had been in October 2008). The current study showed a spiculated mass in the superior subareolar portion of the right breast measuring approximately 5 cm and associated with pleomorphic calcifications. This was firm and palpable. There was right nipple retraction and skin thickening. Ultrasound confirmed an irregularly marginated hypoechoic mass measuring 3.5 cm by ultrasound, and an abnormal appearing lower right axillary lymph node measuring 2.6 cm.  Biopsies of both the breast mass and the abnormal appearing lymph node were performed 05/21/2012. Both showed an invasive ductal carcinoma, grade 2, with similar prognostic panels (the breast mass was 100% estrogen and 73% progesterone receptor positive, with an MIB-1 of 5%; the lymph node was 100% estrogen 100% progesterone receptor positive, with an MIB-1 of 20%). Both masses were HER-2 negative.  Breast MRI obtained at [facility] imaging 05/29/2012 confirmed a dominant mass in the retroareolar right breast measuring 4.4 cm maximally. There was a satellite nodule inferior and lateral to this mass, measuring 1.7 cm. There were no other masses in either breast. Aside from the previously biopsied lymph node there were other mildly enhancing level  I right axillary lymph nodes which did not appear pathologic. There was no other lymphadenopathy noted.   The patient's subsequent history is as detailed below.  OVARIAN CANCER HISTORY: From the 06/16/2014 summary note:  "[name] returns today for review of her restaging studies accompanied by her husband [name]. To summarize her recent history: She was admitted to [facility] with a diagnosis of possible pneumonia in late December 2015. Chest x-ray showed some increased density in the right upper lobe. CT scan was suggested and was obtained by Dr. Inda Castle. This showed 2 small nodules in the right middle lobe, measuring 8 and 4 mm respectively. Dr. Inda Castle then contacted [name] for further evaluation and we set [name] up for a PET scan and which was performed late January. This showed the 2 nodules in the lung not to be hypermetabolic. This of course does not prove that they are not malignant. More importantly, there was a 13.7 cm cystic/solid mass in the upper pelvis associated with adenopathy,. All of this was hypermetabolic."  The patient was evaluated by gynecologic oncology and on 06/27/2014 she underwent surgery under Dr. Denman George. This consisted of an exploratory laparotomy with hysterectomy and abdominal salpingo-oophorectomy, as well as tumor debulking and omentectomy. 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 The tumor measured 11.5 cm, focally involve the ovarian capsule on the left; the uterus and right ovary and fallopian tube were unremarkable. One para-aortic lymph node was positive out of a total of 6 lymph nodes sampled (2 left common iliac, 2 left para-aortic, and to within the omental resection).  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 Today is day 8 cycle 1 of  8 planned cycles of carboplatin and paclitaxel given days 1 and 8 of each 21 day cycle.  REVIEW OF SYSTEMS: [name] tolerated cycle 1 moderately well. She had a minor reaction to Taxol which was managed in the treatment area and did not require interruption of her treatment. She then took her anti-emetics on days 23 and 4. She had no problems with nausea or vomiting. She ate quite well those days. However beginning on day 5, she had no appetite" have not been eating much". She also has not been drinking very much. She feels extremely fatigued and "can't exercise". She is having pain in her belly. She was given oxycodone for this but she is afraid to take it or she has also not taken any Tylenol or Aleve. Her bowel movements switch between constipation and being very loose. This is not unusual for her. Bladder control is good and her urine does not appear to be unusually concentrated. She does have some heartburn. She admits to anxiety and depression. Her sugars are well-controlled. A detailed review of systems today was otherwise stable.  PAST MEDICAL HISTORY: Past Medical History  Diagnosis Date  . Depression   . DVT (deep venous thrombosis)     hx of on HRT left leg ~2006  . GERD (gastroesophageal reflux disease)   . Hyperlipidemia   . Hypertension   . Hypothyroidism   . PPD positive, treated     rx inh   . Diabetes mellitus   . 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 DJD (degenerative joint disease) of lumbar spine   . Chronic diastolic CHF (congestive heart failure)     a. 08/2012 Echo: EF 55-60%, no rwma, mod MR.  [name] A-fib     a. on amio for rate control/coumadin.  [name] Physical deconditioning   . Allergy   . Anemia     low iron hx  . Blood transfusion without reported diagnosis     2 u prbc  . Cataract     removed ou  . OSA on CPAP     cpap 4.5 setting  . Breast cancer 2014    a. Right - invasive ductal carcinoma with 2/18 lymph nodes involved (pT3, pN1a, stage  IIIA), s/p R mastectomy 06/08/12, chemo (not well tolerated->d/c)    PAST SURGICAL HISTORY: Past Surgical History  Procedure Laterality Date  . Tubal ligation    . Cholecystectomy    . Foot surgery    . Eye surgery    . Cataract extraction    . Percutaneous liver biopsy    . Breast biopsy      left breast  . Mastectomy modified radical  06/08/2012    Procedure: MASTECTOMY MODIFIED RADICAL;  Surgeon: Edward Jolly, MD;  Location: [place];  Service: General;  Laterality: Right;  . Portacath placement  06/08/2012    Procedure: INSERTION PORT-A-CATH;  Surgeon: Edward Jolly, MD;  Location: [place];  Service: General;  Laterality: Left;  . Peg tube placement      peg removed 2015  . History of chemotherapy x 2 treatments, radiation tx  2014  . Pac removed  2015  . Laparotomy N/A 06/27/2014    Procedure: EXPLORATORY LAPAROTOMY;  Surgeon: Everitt Amber, MD;  Location: [facility];  Service: Gynecology;  Laterality: N/A;  . Abdominal hysterectomy N/A 06/27/2014  Procedure: HYSTERECTOMY ABDOMINAL TOTAL;  Surgeon: Everitt Amber, MD;  Location: [facility];  Service: Gynecology;  Laterality: N/A;  . Salpingoophorectomy Bilateral 06/27/2014    Procedure: BILATERAL SALPINGO OOPHORECTOMY/TUMOR DEBULKING/LYMPHNODE DISSECTION, OMENTECTOMY;  Surgeon: Everitt Amber, MD;  Location: [facility];  Service: Gynecology;  Laterality: Bilateral;    FAMILY HISTORY Family History  Problem Relation Age of Onset  . Diabetes Mother   . Hypertension Mother   . Arthritis Mother   . Heart disease Mother   . Heart failure Mother   . Other Mother     benign breast mass  . Stroke Father   . Heart disease Father   . Diabetes Paternal Grandmother   . Colon cancer Paternal Grandfather    the patient's father died in his 16s with a history of dementia. He had had prior strokes. The patient's mother died in her 76s, with a history of congestive heart failure. Jalasia had no brothers, one sister. There is no history of breast or  ovarian cancer in the family.  GYNECOLOGIC HISTORY: Menarche age 71, first live birth age 26, she is GX P2, menopause approximately 15 years ago, on hormone replacement until 2010.  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 Her husband [name] works for [employer]. Daughter Paul Half is a Physiological scientist and lives in [place]. Daughter Santiago Bur and her family (husband and 2 children aged 58 and 3 years) currently live with the patient. Tanielle is a member of a [employer].   ADVANCED DIRECTIVES: Not in place  HEALTH MAINTENANCE: (Updated October 2014) History  Substance Use Topics  . Smoking status: Former Smoker -- 1.00 packs/day for 1 years  . Smokeless tobacco: Never Used  . Alcohol Use: No     Colonoscopy: Never  PAP: Does not recall  Bone density: Never  Lipid panel:   Allergies  Allergen Reactions  . Olmesartan Medoxomil Cough    REACTION: ? if cough  . Tetracycline Hcl     Unknown reaction, too long for patient to remember   . Venlafaxine     REACTION: severe dry moouth  . Adhesive [Tape] Rash    Current Outpatient Prescriptions  Medication Sig Dispense Refill  . ALPRAZolam (XANAX XR) 1 MG 24 hr tablet Take 1 tablet (1 mg total) by mouth 2 (two) times daily. 60 tablet 1  . buPROPion (WELLBUTRIN) 75 MG tablet Take 1 tablet (75 mg total) by mouth 2 (two) times daily. 14 tablet 0  . clonazePAM (KLONOPIN) 0.5 MG tablet TAKE 1/2 TABLET TWICE DAILY 30 tablet 0  . dexamethasone (DECADRON) 4 MG tablet Take 2 tablets (8 mg total) by mouth 2 (two) times daily with a meal. Start the day after chemotherapy for 3 days. 30 tablet 1  . enoxaparin (LOVENOX) 120 MG/0.8ML injection Inject 0.8 mLs (120 mg total) into the skin daily. 10 Syringe 1  . escitalopram (LEXAPRO) 20 MG tablet Take 1 tablet (20 mg total) by mouth daily. 90 tablet 1  . furosemide  (LASIX) 20 MG tablet Take 1 tablet (20 mg total) by mouth daily. 90 tablet 1  . letrozole (FEMARA) 2.5 MG tablet Take 1 tablet (2.5 mg total) by mouth daily. 90 tablet 0  . levothyroxine (SYNTHROID, LEVOTHROID) 150 MCG tablet Take 1 tablet (150 mcg total) by mouth daily. 90 tablet 1  . lidocaine-prilocaine (EMLA) cream Apply over port area 1-2 hours before chemotherapy 30  g 0  . LORazepam (ATIVAN) 0.5 MG tablet Take 1 tablet (0.5 mg total) by mouth at bedtime as needed for anxiety. 30 tablet 1  . metFORMIN (GLUCOPHAGE) 500 MG tablet Take 1 tablet (500 mg total) by mouth 2 (two) times daily with a meal. 180 tablet 1  . metoprolol tartrate (LOPRESSOR) 25 MG tablet Take 1 tablet (25 mg total) by mouth 2 (two) times daily. 180 tablet 1  . OLANZapine (ZYPREXA) 2.5 MG tablet TAKE 1 TABLET NIGHTLY AT BEDTIME AS NEEDED FOR SLEEP OR anxiety 90 tablet 1  . omeprazole (PRILOSEC) 20 MG capsule 1 tab po bid (Patient taking differently: Take 20 mg by mouth daily as needed (FOR REFLUX). ) 60 capsule 1  . ondansetron (ZOFRAN) 8 MG tablet Take 1 tablet (8 mg total) by mouth 2 (two) times daily. Start the day after chemo for 3 days. Then take as needed for nausea or vomiting. 30 tablet 1  . potassium chloride (K-DUR) 10 MEQ tablet Take 10 mEq by mouth 2 (two) times daily.  3  . prochlorperazine (COMPAZINE) 10 MG tablet Take 1 tablet (10 mg total) by mouth every 6 (six) hours as needed (Nausea or vomiting). 30 tablet 1  . spironolactone (ALDACTONE) 25 MG tablet Take 1 tablet (25 mg total) by mouth daily. 90 tablet 1  . warfarin (COUMADIN) 3 MG tablet Take 1-2 tablets (3-6 mg total) by mouth daily. as directed by coumadin clinic (Patient taking differently: Take 3-6 mg by mouth daily. Rotates 1 tablet then 2 the next day) 135 tablet 1   No current facility-administered medications for this visit.    OBJECTIVE: Middle-aged white woman examined in a wheelchair Filed Vitals:   07/24/14 1216  BP: 137/81  Pulse: 80   Temp: 98.4 F (36.9 C)  Resp: 19     Body mass index is 29.77 kg/(m^2).    ECOG FS: 2 Filed Weights   07/24/14 1216  Weight: 173 lb 8 oz (78.699 kg)   Sclerae unicteric, EOMs intact Oropharynx clear, dentition in fair repair No cervical or supraclavicular adenopathy Lungs no rales or rhonchi Heart regular rate and rhythm Abd soft, obese, minimally uncomfortable to palpation, positive bowel sounds; no distention or fluid wave MSK no focal spinal tenderness, no upper extremity lymphedema Neuro: nonfocal, well oriented, anxious affect Breasts: Deferred   LAB RESULTS: Lab Results  Component Value Date   WBC 2.2* 07/24/2014   NEUTROABS 1.1* 07/24/2014   HGB 14.0 07/24/2014   HCT 43.5 07/24/2014   MCV 92.2 07/24/2014   PLT 119* 07/24/2014      Chemistry      Component Value Date/Time   NA 138 07/24/2014 1110   NA 137 07/11/2014 0745   K 4.0 07/24/2014 1110   K 3.7 07/11/2014 0745   CL 101 07/11/2014 0745   CL 104 10/29/2012 1058   CO2 22 07/24/2014 1110   CO2 24 07/11/2014 0745   BUN 9.9 07/24/2014 1110   BUN 6 07/11/2014 0745   CREATININE 0.7 07/24/2014 1110   CREATININE 0.59 07/11/2014 0745   CREATININE 0.69 11/10/2013 0828      Component Value Date/Time   CALCIUM 9.1 07/24/2014 1110   CALCIUM 9.2 07/11/2014 0745   ALKPHOS 120 07/24/2014 1110   ALKPHOS 77 06/28/2014 0525   AST 70* 07/24/2014 1110   AST 59* 06/28/2014 0525   ALT 86* 07/24/2014 1110   ALT 32 06/28/2014 0525   BILITOT 1.67* 07/24/2014 1110   BILITOT 1.3* 06/28/2014 0525  STUDIES: Ir Fluoro Guide Cv Line Left  07/11/2014   CLINICAL DATA:  PRIOR HISTORY BREAST CANCER, NEW DIAGNOSIS OF OVARIAN CANCER.  EXAM: LEFT INTERNAL JUGULAR SINGLE LUMEN POWER PORT CATHETER INSERTION  Date:  3/1/20163/05/2014 10:20 am  Radiologist:  M. Daryll Brod, MD  Guidance:  ULTRASOUND AND FLUOROSCOPIC  FLUOROSCOPY TIME:  1 MINUTES 12 SECONDS  MEDICATIONS AND MEDICAL HISTORY: 2 g Ancefadministered within 1 hour of  the procedure.Versed and fentanyl for conscious sedation  ANESTHESIA/SEDATION: 40 minutes  CONTRAST:  None.  COMPLICATIONS: None immediate  PROCEDURE: Informed consent was obtained from the patient following explanation of the procedure, risks, benefits and alternatives. The patient understands, agrees and consents for the procedure. All questions were addressed. A time out was performed.  Maximal barrier sterile technique utilized including caps, mask, sterile gowns, sterile gloves, large sterile drape, hand hygiene, and 2% chlorhexidine scrub.  Under sterile conditions and local anesthesia, right internal jugular micropuncture venous access was performed. Access was performed with ultrasound. Images were obtained for documentation. A guide wire was inserted followed by a transitional dilator. This allowed insertion of a guide wire and catheter into the IVC. Measurements were obtained from the SVC / RA junction back to the right IJ venotomy site. In the right infraclavicular chest, a subcutaneous pocket was created over the second anterior rib. This was done under sterile conditions and local anesthesia. 1% lidocaine with epinephrine was utilized for this. A 2.5 cm incision was made in the skin. Blunt dissection was performed to create a subcutaneous pocket over the right pectoralis major muscle. The pocket was flushed with saline vigorously. There was adequate hemostasis. The port catheter was assembled and checked for leakage. The port catheter was secured in the pocket with two retention sutures. The tubing was tunneled subcutaneously to the right venotomy site and inserted into the SVC/RA junction through a valved peel-away sheath. Position was confirmed with fluoroscopy. Images were obtained for documentation. The patient tolerated the procedure well. No immediate complications. Incisions were closed in a two layer fashion with 4 - 0 Vicryl suture. Dermabond was applied to the skin. The port catheter was  accessed, blood was aspirated followed by saline and heparin flushes. Needle was removed. A dry sterile dressing was applied.  IMPRESSION: Ultrasound and fluoroscopically guided right internal jugular single lumen power port catheter insertion. Tip in the SVC/RA junction. Catheter ready for use.   Electronically Signed   By: Jerilynn Mages.  Shick M.D.   On: 07/11/2014 10:31   Ir US Guide Vasc Access Left  07/11/2014   CLINICAL DATA:  PRIOR HISTORY BREAST CANCER, NEW DIAGNOSIS OF OVARIAN CANCER.  EXAM: LEFT INTERNAL JUGULAR SINGLE LUMEN POWER PORT CATHETER INSERTION  Date:  3/1/20163/05/2014 10:20 am  Radiologist:  M. Daryll Brod, MD  Guidance:  ULTRASOUND AND FLUOROSCOPIC  FLUOROSCOPY TIME:  1 MINUTES 12 SECONDS  MEDICATIONS AND MEDICAL HISTORY: 2 g Ancefadministered within 1 hour of the procedure.Versed and fentanyl for conscious sedation  ANESTHESIA/SEDATION: 40 minutes  CONTRAST:  None.  COMPLICATIONS: None immediate  PROCEDURE: Informed consent was obtained from the patient following explanation of the procedure, risks, benefits and alternatives. The patient understands, agrees and consents for the procedure. All questions were addressed. A time out was performed.  Maximal barrier sterile technique utilized including caps, mask, sterile gowns, sterile gloves, large sterile drape, hand hygiene, and 2% chlorhexidine scrub.  Under sterile conditions and local anesthesia, right internal jugular micropuncture venous access was performed. Access was performed with ultrasound. Images were obtained  for documentation. A guide wire was inserted followed by a transitional dilator. This allowed insertion of a guide wire and catheter into the IVC. Measurements were obtained from the SVC / RA junction back to the right IJ venotomy site. In the right infraclavicular chest, a subcutaneous pocket was created over the second anterior rib. This was done under sterile conditions and local anesthesia. 1% lidocaine with epinephrine was  utilized for this. A 2.5 cm incision was made in the skin. Blunt dissection was performed to create a subcutaneous pocket over the right pectoralis major muscle. The pocket was flushed with saline vigorously. There was adequate hemostasis. The port catheter was assembled and checked for leakage. The port catheter was secured in the pocket with two retention sutures. The tubing was tunneled subcutaneously to the right venotomy site and inserted into the SVC/RA junction through a valved peel-away sheath. Position was confirmed with fluoroscopy. Images were obtained for documentation. The patient tolerated the procedure well. No immediate complications. Incisions were closed in a two layer fashion with 4 - 0 Vicryl suture. Dermabond was applied to the skin. The port catheter was accessed, blood was aspirated followed by saline and heparin flushes. Needle was removed. A dry sterile dressing was applied.  IMPRESSION: Ultrasound and fluoroscopically guided right internal jugular single lumen power port catheter insertion. Tip in the SVC/RA junction. Catheter ready for use.   Electronically Signed   By: Jerilynn Mages.  Shick M.D.   On: 07/11/2014 10:31    CT CHEST WITHOUT CONTRAST  TECHNIQUE: Multidetector CT imaging of the chest was performed following the standard protocol without IV contrast.  COMPARISON: Chest x-ray of 04/26/2014  FINDINGS: On lung window images, there is radiation fibrosis within the anterior right upper mid upper lung field which may account for the vague opacity questioned recently on chest x-ray. Also there is right apical pleural parenchymal scarring present. No infiltrate or effusion is seen. Changes of right mastectomy are noted and surgical clips are present in the right axilla from prior right axillary nodal dissection. However, on lung window images, there are 2 new noncalcified nodules present, both in the right middle lobe, one of 8 mm and a second of 4 mm in diameter. These are  not seen on the prior CT chest of 11/07/2008, and therefore these nodules are worrisome for metastatic involvement. No left lung nodules are seen. Somewhat prominent interstitial markings are present primarily at the lung bases posteriorly. These may be chronic in nature but mild fluid overload cannot be excluded. The central airway is patent.  On soft tissue window images, on this unenhanced study, there is no change in previously noted mediastinal lymph nodes. None appear pathologically enlarged. Coronary artery calcifications are present primarily in the distribution of the left anterior descending artery. No pericardial effusion is seen. The liver appears somewhat nodular peripherally and changes of cirrhosis are a consideration with splenomegaly also present. No bony abnormality is seen.  IMPRESSION: 1. The area questioned by chest x-ray in the right upper lobe probably represents radiation fibrosis. No right upper lobe lesion is seen. 2. 2 noncalcified nodules in the right middle lobe, not present on the CT from 2010, therefore worrisome for possible metastatic involvement. 3. Prominent interstitial markings primarily at the lung bases posteriorly may be chronic, but mild fluid overload cannot be excluded. 4. Nodular periphery of the liver may represent changes of cirrhosis with splenomegaly also present.   Electronically Signed  By: Ivar Drape M.D.  On: 05/16/2014 08:18   ASSESSMENT:  59 y.o. [name]   (1)  status post right mastectomy  06/08/2012 for a pT3, pN1a, stage IIIA invasive ductal carcinoma, grade 2,  estrogen and progesterone receptor positive, HER-2/neu negative, with an MIB-1 of 20%.    (2)  treated in the adjuvant setting with docetaxel/ cyclophosphamide given every 3 weeks. There were multiple and severe complications, and the  patient tolerated only two cycles, last dose 07/29/2012  (3) letrozole started 08/16/2012  (a) osteopenia, with  a T score of -1.14 April 2013  (4) postmastectomy radiation completed 12/24/2012  (5)  comorbidities include diabetes, hypertension, chronic liver disease with cirrhosis and fatty liver, and nutritional disturbance with temporarily dependence on PEG feeds (discontinued July 2014).  (6) restaging studies January 2016 show  (a) 8 mm and 4 mm Right middle lobe lung nodules, not metabolically active on PET -- Rhonda require follow-up  (b) hypermetabolic 82.6 cm mixed solid/cystic lesion in upper pelvis, with associated adenopathy  (7) status post exploratory laparotomy 06/27/2014 with total abdominal hysterectomy, bilateral salpingo-oophorectomy, para-aortic lymphadenectomy, omentectomy and radical tumor debulking for a clear cell ovarian cancer, pT1c pN1, stage IIIC .  (8) adjuvant chemotherapy consisting of carboplatin and paclitaxel given weekly days 1 and 8 of each 21 day cycle, for 6-8 cycles as tolerated, started 07/17/2014, with onpro support   PLAN: Shontel had a mild reaction to the Taxol with the first dose. Hopefully that Rhonda not recur with subsequent doses. Of course we Rhonda continue to premedicate her appropriately.  She has a mild elevation in her total bilirubin. She does have documented hepatic steatosis. We Rhonda simply follow for now. We Rhonda be checking her bilirubin and other labs again in a week.  Her ANC is 1.1. I think if we don't offer that some support we Rhonda have to delay her treatments along which would compromise results. Accordingly I am adding onpro to today's treatment and to every day 8 treatment each cycle.  She did fine as far as eating is concerned so long as she was on steroids. When she came off the steroids she stopped eating. Accordingly after she completes her 3 days of anti-emetic Decadron) 8 mg twice daily on days 23 and 4) on the next 2 days she Rhonda take dexamethasone 4 mg in the morning. Hopefully that Rhonda help with the eating issue.  She is also not  drinking enough. I am going to give her some fluids this Friday and next Monday. We Rhonda likely have to repeat that pretty much with every cycle.  Finally I have strongly encouraged her to take little walks around the house several times a day. She is not going to feel better just by resting. I am hopeful we disease minor changes we Rhonda make it possible for her to receive her chemotherapy is proposed  Colbi has a good understanding of this plan. She agrees with it. She knows the goal of treatment in her case is cure. She Rhonda call with any problems that may develop before her next visit here.   Chauncey Cruel, MD      07/24/2014

## 2014-07-28 ENCOUNTER — Inpatient Hospital Stay (HOSPITAL_COMMUNITY)
Admission: AD | Admit: 2014-07-28 | Discharge: 2014-08-08 | DRG: 871 | Disposition: A | Discharge status: Home Health | Payer: BLUE CROSS/BLUE SHIELD | Source: Ambulatory Visit | Attending: Internal Medicine | Admitting: Internal Medicine

## 2014-07-28 ENCOUNTER — Telehealth: Payer: Self-pay | Admitting: *Deleted

## 2014-07-28 ENCOUNTER — Ambulatory Visit (HOSPITAL_BASED_OUTPATIENT_CLINIC_OR_DEPARTMENT_OTHER): Payer: BLUE CROSS/BLUE SHIELD

## 2014-07-28 ENCOUNTER — Other Ambulatory Visit: Payer: Self-pay

## 2014-07-28 ENCOUNTER — Encounter (HOSPITAL_COMMUNITY): Payer: Self-pay | Admitting: Internal Medicine

## 2014-07-28 ENCOUNTER — Other Ambulatory Visit: Payer: Self-pay | Admitting: Nurse Practitioner

## 2014-07-28 ENCOUNTER — Inpatient Hospital Stay (HOSPITAL_COMMUNITY): Payer: BLUE CROSS/BLUE SHIELD

## 2014-07-28 ENCOUNTER — Ambulatory Visit (HOSPITAL_BASED_OUTPATIENT_CLINIC_OR_DEPARTMENT_OTHER): Payer: BLUE CROSS/BLUE SHIELD | Admitting: Nurse Practitioner

## 2014-07-28 DIAGNOSIS — T451X5A Adverse effect of antineoplastic and immunosuppressive drugs, initial encounter: Secondary | ICD-10-CM | POA: Diagnosis present

## 2014-07-28 DIAGNOSIS — K7460 Unspecified cirrhosis of liver: Secondary | ICD-10-CM | POA: Diagnosis present

## 2014-07-28 DIAGNOSIS — R5081 Fever presenting with conditions classified elsewhere: Secondary | ICD-10-CM | POA: Diagnosis present

## 2014-07-28 DIAGNOSIS — Z87891 Personal history of nicotine dependence: Secondary | ICD-10-CM

## 2014-07-28 DIAGNOSIS — Z6830 Body mass index (BMI) 30.0-30.9, adult: Secondary | ICD-10-CM | POA: Diagnosis not present

## 2014-07-28 DIAGNOSIS — R6521 Severe sepsis with septic shock: Secondary | ICD-10-CM | POA: Diagnosis present

## 2014-07-28 DIAGNOSIS — R7989 Other specified abnormal findings of blood chemistry: Secondary | ICD-10-CM | POA: Diagnosis not present

## 2014-07-28 DIAGNOSIS — Z9011 Acquired absence of right breast and nipple: Secondary | ICD-10-CM | POA: Diagnosis present

## 2014-07-28 DIAGNOSIS — I482 Chronic atrial fibrillation, unspecified: Secondary | ICD-10-CM | POA: Diagnosis present

## 2014-07-28 DIAGNOSIS — I480 Paroxysmal atrial fibrillation: Secondary | ICD-10-CM | POA: Diagnosis present

## 2014-07-28 DIAGNOSIS — D6959 Other secondary thrombocytopenia: Secondary | ICD-10-CM | POA: Diagnosis present

## 2014-07-28 DIAGNOSIS — E11649 Type 2 diabetes mellitus with hypoglycemia without coma: Secondary | ICD-10-CM | POA: Diagnosis present

## 2014-07-28 DIAGNOSIS — D696 Thrombocytopenia, unspecified: Secondary | ICD-10-CM

## 2014-07-28 DIAGNOSIS — I959 Hypotension, unspecified: Secondary | ICD-10-CM | POA: Insufficient documentation

## 2014-07-28 DIAGNOSIS — E162 Hypoglycemia, unspecified: Secondary | ICD-10-CM | POA: Insufficient documentation

## 2014-07-28 DIAGNOSIS — G9341 Metabolic encephalopathy: Secondary | ICD-10-CM | POA: Diagnosis present

## 2014-07-28 DIAGNOSIS — K219 Gastro-esophageal reflux disease without esophagitis: Secondary | ICD-10-CM | POA: Diagnosis present

## 2014-07-28 DIAGNOSIS — R509 Fever, unspecified: Secondary | ICD-10-CM

## 2014-07-28 DIAGNOSIS — C50919 Malignant neoplasm of unspecified site of unspecified female breast: Secondary | ICD-10-CM | POA: Diagnosis present

## 2014-07-28 DIAGNOSIS — Z7901 Long term (current) use of anticoagulants: Secondary | ICD-10-CM | POA: Diagnosis not present

## 2014-07-28 DIAGNOSIS — M479 Spondylosis, unspecified: Secondary | ICD-10-CM | POA: Diagnosis present

## 2014-07-28 DIAGNOSIS — M8580 Other specified disorders of bone density and structure, unspecified site: Secondary | ICD-10-CM | POA: Diagnosis present

## 2014-07-28 DIAGNOSIS — E440 Moderate protein-calorie malnutrition: Secondary | ICD-10-CM | POA: Diagnosis present

## 2014-07-28 DIAGNOSIS — D701 Agranulocytosis secondary to cancer chemotherapy: Secondary | ICD-10-CM | POA: Diagnosis not present

## 2014-07-28 DIAGNOSIS — Z888 Allergy status to other drugs, medicaments and biological substances status: Secondary | ICD-10-CM | POA: Diagnosis not present

## 2014-07-28 DIAGNOSIS — D61810 Antineoplastic chemotherapy induced pancytopenia: Secondary | ICD-10-CM | POA: Diagnosis present

## 2014-07-28 DIAGNOSIS — Z9049 Acquired absence of other specified parts of digestive tract: Secondary | ICD-10-CM | POA: Diagnosis present

## 2014-07-28 DIAGNOSIS — E872 Acidosis: Secondary | ICD-10-CM | POA: Diagnosis present

## 2014-07-28 DIAGNOSIS — R197 Diarrhea, unspecified: Secondary | ICD-10-CM | POA: Diagnosis not present

## 2014-07-28 DIAGNOSIS — E860 Dehydration: Secondary | ICD-10-CM

## 2014-07-28 DIAGNOSIS — D709 Neutropenia, unspecified: Secondary | ICD-10-CM

## 2014-07-28 DIAGNOSIS — A4159 Other Gram-negative sepsis: Secondary | ICD-10-CM | POA: Diagnosis present

## 2014-07-28 DIAGNOSIS — Z79899 Other long term (current) drug therapy: Secondary | ICD-10-CM | POA: Diagnosis not present

## 2014-07-28 DIAGNOSIS — Z881 Allergy status to other antibiotic agents status: Secondary | ICD-10-CM

## 2014-07-28 DIAGNOSIS — Z923 Personal history of irradiation: Secondary | ICD-10-CM

## 2014-07-28 DIAGNOSIS — C569 Malignant neoplasm of unspecified ovary: Secondary | ICD-10-CM

## 2014-07-28 DIAGNOSIS — I340 Nonrheumatic mitral (valve) insufficiency: Secondary | ICD-10-CM | POA: Diagnosis present

## 2014-07-28 DIAGNOSIS — G4733 Obstructive sleep apnea (adult) (pediatric): Secondary | ICD-10-CM | POA: Diagnosis present

## 2014-07-28 DIAGNOSIS — Z800 Family history of malignant neoplasm of digestive organs: Secondary | ICD-10-CM | POA: Diagnosis not present

## 2014-07-28 DIAGNOSIS — R112 Nausea with vomiting, unspecified: Secondary | ICD-10-CM

## 2014-07-28 DIAGNOSIS — F329 Major depressive disorder, single episode, unspecified: Secondary | ICD-10-CM | POA: Diagnosis present

## 2014-07-28 DIAGNOSIS — E1165 Type 2 diabetes mellitus with hyperglycemia: Secondary | ICD-10-CM | POA: Diagnosis present

## 2014-07-28 DIAGNOSIS — C50011 Malignant neoplasm of nipple and areola, right female breast: Secondary | ICD-10-CM | POA: Diagnosis not present

## 2014-07-28 DIAGNOSIS — K760 Fatty (change of) liver, not elsewhere classified: Secondary | ICD-10-CM | POA: Diagnosis present

## 2014-07-28 DIAGNOSIS — Z833 Family history of diabetes mellitus: Secondary | ICD-10-CM

## 2014-07-28 DIAGNOSIS — N170 Acute kidney failure with tubular necrosis: Secondary | ICD-10-CM | POA: Diagnosis present

## 2014-07-28 DIAGNOSIS — E039 Hypothyroidism, unspecified: Secondary | ICD-10-CM | POA: Diagnosis present

## 2014-07-28 DIAGNOSIS — I5032 Chronic diastolic (congestive) heart failure: Secondary | ICD-10-CM | POA: Diagnosis not present

## 2014-07-28 DIAGNOSIS — E785 Hyperlipidemia, unspecified: Secondary | ICD-10-CM | POA: Diagnosis present

## 2014-07-28 DIAGNOSIS — R630 Anorexia: Secondary | ICD-10-CM

## 2014-07-28 DIAGNOSIS — E8809 Other disorders of plasma-protein metabolism, not elsewhere classified: Secondary | ICD-10-CM

## 2014-07-28 DIAGNOSIS — E861 Hypovolemia: Secondary | ICD-10-CM | POA: Diagnosis present

## 2014-07-28 DIAGNOSIS — C50911 Malignant neoplasm of unspecified site of right female breast: Secondary | ICD-10-CM

## 2014-07-28 DIAGNOSIS — C561 Malignant neoplasm of right ovary: Secondary | ICD-10-CM | POA: Diagnosis not present

## 2014-07-28 DIAGNOSIS — I5043 Acute on chronic combined systolic (congestive) and diastolic (congestive) heart failure: Secondary | ICD-10-CM | POA: Diagnosis present

## 2014-07-28 DIAGNOSIS — Z9221 Personal history of antineoplastic chemotherapy: Secondary | ICD-10-CM

## 2014-07-28 DIAGNOSIS — Z8249 Family history of ischemic heart disease and other diseases of the circulatory system: Secondary | ICD-10-CM | POA: Diagnosis not present

## 2014-07-28 DIAGNOSIS — E876 Hypokalemia: Secondary | ICD-10-CM | POA: Diagnosis present

## 2014-07-28 DIAGNOSIS — E1129 Type 2 diabetes mellitus with other diabetic kidney complication: Secondary | ICD-10-CM | POA: Diagnosis present

## 2014-07-28 DIAGNOSIS — R531 Weakness: Secondary | ICD-10-CM

## 2014-07-28 DIAGNOSIS — I248 Other forms of acute ischemic heart disease: Secondary | ICD-10-CM | POA: Diagnosis present

## 2014-07-28 DIAGNOSIS — J9690 Respiratory failure, unspecified, unspecified whether with hypoxia or hypercapnia: Secondary | ICD-10-CM

## 2014-07-28 DIAGNOSIS — C562 Malignant neoplasm of left ovary: Secondary | ICD-10-CM

## 2014-07-28 DIAGNOSIS — C773 Secondary and unspecified malignant neoplasm of axilla and upper limb lymph nodes: Secondary | ICD-10-CM | POA: Diagnosis not present

## 2014-07-28 DIAGNOSIS — A419 Sepsis, unspecified organism: Secondary | ICD-10-CM | POA: Diagnosis not present

## 2014-07-28 DIAGNOSIS — D702 Other drug-induced agranulocytosis: Secondary | ICD-10-CM | POA: Insufficient documentation

## 2014-07-28 DIAGNOSIS — I509 Heart failure, unspecified: Secondary | ICD-10-CM

## 2014-07-28 DIAGNOSIS — G903 Multi-system degeneration of the autonomic nervous system: Secondary | ICD-10-CM | POA: Diagnosis not present

## 2014-07-28 DIAGNOSIS — I4891 Unspecified atrial fibrillation: Secondary | ICD-10-CM

## 2014-07-28 DIAGNOSIS — Z86718 Personal history of other venous thrombosis and embolism: Secondary | ICD-10-CM | POA: Diagnosis not present

## 2014-07-28 DIAGNOSIS — I10 Essential (primary) hypertension: Secondary | ICD-10-CM | POA: Diagnosis present

## 2014-07-28 DIAGNOSIS — F419 Anxiety disorder, unspecified: Secondary | ICD-10-CM | POA: Diagnosis present

## 2014-07-28 DIAGNOSIS — R109 Unspecified abdominal pain: Secondary | ICD-10-CM

## 2014-07-28 DIAGNOSIS — Z823 Family history of stroke: Secondary | ICD-10-CM | POA: Diagnosis not present

## 2014-07-28 DIAGNOSIS — Z90710 Acquired absence of both cervix and uterus: Secondary | ICD-10-CM

## 2014-07-28 DIAGNOSIS — IMO0002 Reserved for concepts with insufficient information to code with codable children: Secondary | ICD-10-CM | POA: Diagnosis present

## 2014-07-28 DIAGNOSIS — R601 Generalized edema: Secondary | ICD-10-CM | POA: Diagnosis not present

## 2014-07-28 DIAGNOSIS — C50111 Malignant neoplasm of central portion of right female breast: Secondary | ICD-10-CM

## 2014-07-28 DIAGNOSIS — R945 Abnormal results of liver function studies: Secondary | ICD-10-CM

## 2014-07-28 LAB — COMPREHENSIVE METABOLIC PANEL (CC13)
ALT: 42 U/L (ref 0–55)
AST: 21 U/L (ref 5–34)
Albumin: 2.7 g/dL — ABNORMAL LOW (ref 3.5–5.0)
Alkaline Phosphatase: 102 U/L (ref 40–150)
Anion Gap: 13 mEq/L — ABNORMAL HIGH (ref 3–11)
BUN: 13.1 mg/dL (ref 7.0–26.0)
CHLORIDE: 100 meq/L (ref 98–109)
CO2: 18 mEq/L — ABNORMAL LOW (ref 22–29)
Calcium: 7.8 mg/dL — ABNORMAL LOW (ref 8.4–10.4)
Creatinine: 0.7 mg/dL (ref 0.6–1.1)
EGFR: >90 mL/min/{1.73_m2} (ref >90)
GLUCOSE: 296 mg/dL — AB (ref 70–140)
Potassium: 3.6 mEq/L (ref 3.5–5.1)
SODIUM: 132 meq/L — AB (ref 136–145)
Total Bilirubin: 1.72 mg/dL — ABNORMAL HIGH (ref 0.20–1.20)
Total Protein: 5.3 g/dL — ABNORMAL LOW (ref 6.4–8.3)

## 2014-07-28 LAB — CBC WITH DIFFERENTIAL/PLATELET
HCT: 38.6 % (ref 34.8–46.6)
HEMOGLOBIN: 13.5 g/dL (ref 11.6–15.9)
MCH: 30.9 pg (ref 25.1–34.0)
MCHC: 35 g/dL (ref 31.5–36.0)
MCV: 88.3 fL (ref 79.5–101.0)
Platelets: 91 10*3/uL — ABNORMAL LOW (ref 145–400)
RBC: 4.37 10*6/uL (ref 3.70–5.45)
RDW: 13.3 % (ref 11.2–14.5)

## 2014-07-28 LAB — TECHNOLOGIST REVIEW

## 2014-07-28 MED ORDER — ACETAMINOPHEN 325 MG PO TABS: 650.0000 mg | ORAL_TABLET | Freq: Four times a day (QID) | ORAL | Status: DC | PRN

## 2014-07-28 MED ORDER — SODIUM CHLORIDE 0.9 % IV BOLUS (SEPSIS)
1000.0000 mL | Freq: Once | INTRAVENOUS | Status: DC
  Administered 2014-07-28: 1000 mL via INTRAVENOUS

## 2014-07-28 MED ORDER — VANCOMYCIN HCL IN DEXTROSE 1-5 GM/200ML-% IV SOLN
1000.0000 mg | Freq: Two times a day (BID) | INTRAVENOUS | Status: DC
  Administered 2014-07-28 – 2014-07-29 (×3): 1000 mg via INTRAVENOUS
  Filled 2014-07-28 (×3): qty 200

## 2014-07-28 MED ORDER — PROCHLORPERAZINE MALEATE 10 MG PO TABS: 10.0000 mg | ORAL_TABLET | Freq: Four times a day (QID) | ORAL | Status: DC | PRN

## 2014-07-28 MED ORDER — LEVOTHYROXINE SODIUM 25 MCG PO TABS
150.0000 ug | ORAL_TABLET | Freq: Every day | ORAL | Status: DC
  Administered 2014-07-29 – 2014-08-08 (×11): 150 ug via ORAL
  Filled 2014-07-28: qty 2
  Filled 2014-07-28: qty 1
  Filled 2014-07-28 (×2): qty 2
  Filled 2014-07-28 (×2): qty 1
  Filled 2014-07-28 (×8): qty 2

## 2014-07-28 MED ORDER — DIPHENOXYLATE-ATROPINE 2.5-0.025 MG PO TABS
2.0000 | ORAL_TABLET | Freq: Once | ORAL | Status: AC
  Administered 2014-07-28: 2 via ORAL

## 2014-07-28 MED ORDER — SODIUM CHLORIDE 0.9 % IJ SOLN
10.0000 mL | INTRAMUSCULAR | Status: DC | PRN
  Filled 2014-07-28: qty 10

## 2014-07-28 MED ORDER — OLANZAPINE 2.5 MG PO TABS
2.5000 mg | ORAL_TABLET | Freq: Every day | ORAL | Status: DC
  Administered 2014-07-28: 2.5 mg via ORAL
  Filled 2014-07-28 (×2): qty 1

## 2014-07-28 MED ORDER — INSULIN ASPART 100 UNIT/ML ~~LOC~~ SOLN: 0.0000 [IU] | Freq: Three times a day (TID) | SUBCUTANEOUS | Status: DC

## 2014-07-28 MED ORDER — ONDANSETRON HCL 8 MG PO TABS: 8.0000 mg | ORAL_TABLET | Freq: Two times a day (BID) | ORAL | Status: DC

## 2014-07-28 MED ORDER — LOPERAMIDE HCL 2 MG PO CAPS
ORAL_CAPSULE | ORAL | Status: AC
  Filled 2014-07-28: qty 1

## 2014-07-28 MED ORDER — SODIUM CHLORIDE 0.9 % IV SOLN
Freq: Once | INTRAVENOUS | Status: AC
  Administered 2014-07-28: 12:00:00 via INTRAVENOUS

## 2014-07-28 MED ORDER — PANTOPRAZOLE SODIUM 40 MG PO TBEC
40.0000 mg | DELAYED_RELEASE_TABLET | Freq: Every day | ORAL | Status: DC
  Administered 2014-07-29 – 2014-07-31 (×3): 40 mg via ORAL
  Filled 2014-07-28 (×3): qty 1

## 2014-07-28 MED ORDER — METOPROLOL TARTRATE 25 MG PO TABS
25.0000 mg | ORAL_TABLET | Freq: Two times a day (BID) | ORAL | Status: DC
  Administered 2014-07-29: 25 mg via ORAL
  Filled 2014-07-28: qty 1

## 2014-07-28 MED ORDER — ACETAMINOPHEN 650 MG RE SUPP: 650.0000 mg | Freq: Four times a day (QID) | RECTAL | Status: DC | PRN

## 2014-07-28 MED ORDER — LORAZEPAM 0.5 MG PO TABS: 0.5000 mg | ORAL_TABLET | Freq: Every evening | ORAL | Status: DC | PRN

## 2014-07-28 MED ORDER — ONDANSETRON HCL 4 MG PO TABS
4.0000 mg | ORAL_TABLET | Freq: Four times a day (QID) | ORAL | Status: DC | PRN
  Administered 2014-07-30 – 2014-08-04 (×2): 4 mg via ORAL
  Filled 2014-07-28 (×2): qty 1

## 2014-07-28 MED ORDER — ONDANSETRON HCL 4 MG/2ML IJ SOLN
4.0000 mg | Freq: Four times a day (QID) | INTRAMUSCULAR | Status: DC | PRN
  Administered 2014-07-29 – 2014-07-31 (×4): 4 mg via INTRAVENOUS
  Filled 2014-07-28 (×4): qty 2

## 2014-07-28 MED ORDER — LETROZOLE 2.5 MG PO TABS
2.5000 mg | ORAL_TABLET | Freq: Every day | ORAL | Status: DC
  Administered 2014-07-31 – 2014-08-08 (×9): 2.5 mg via ORAL
  Filled 2014-07-28 (×13): qty 1

## 2014-07-28 MED ORDER — BUPROPION HCL 75 MG PO TABS
75.0000 mg | ORAL_TABLET | Freq: Two times a day (BID) | ORAL | Status: DC
  Administered 2014-07-28: 75 mg via ORAL
  Filled 2014-07-28 (×3): qty 1

## 2014-07-28 MED ORDER — POTASSIUM CHLORIDE ER 10 MEQ PO TBCR
10.0000 meq | EXTENDED_RELEASE_TABLET | Freq: Two times a day (BID) | ORAL | Status: DC
  Administered 2014-07-28 – 2014-07-30 (×3): 10 meq via ORAL
  Filled 2014-07-28 (×10): qty 1

## 2014-07-28 MED ORDER — HEPARIN SOD (PORK) LOCK FLUSH 100 UNIT/ML IV SOLN
500.0000 [IU] | Freq: Once | INTRAVENOUS | Status: DC | PRN
  Filled 2014-07-28: qty 5

## 2014-07-28 MED ORDER — SODIUM CHLORIDE 0.9 % IV SOLN
INTRAVENOUS | Status: DC
  Administered 2014-07-28: via INTRAVENOUS

## 2014-07-28 MED ORDER — LIDOCAINE-PRILOCAINE 2.5-2.5 % EX CREA: TOPICAL_CREAM | Freq: Once | CUTANEOUS | Status: DC

## 2014-07-28 MED ORDER — ALPRAZOLAM ER 1 MG PO TB24
1.0000 mg | ORAL_TABLET | Freq: Two times a day (BID) | ORAL | Status: DC
  Administered 2014-07-29: 1 mg via ORAL
  Filled 2014-07-28: qty 1

## 2014-07-28 MED ORDER — CEFEPIME HCL 2 G IJ SOLR
2.0000 g | Freq: Three times a day (TID) | INTRAMUSCULAR | Status: DC
  Administered 2014-07-29 – 2014-08-01 (×11): 2 g via INTRAVENOUS
  Filled 2014-07-28 (×13): qty 2

## 2014-07-28 MED ORDER — ESCITALOPRAM OXALATE 20 MG PO TABS
20.0000 mg | ORAL_TABLET | Freq: Every day | ORAL | Status: DC
  Administered 2014-07-29: 20 mg via ORAL
  Filled 2014-07-28: qty 1

## 2014-07-28 MED ORDER — DIPHENOXYLATE-ATROPINE 2.5-0.025 MG PO TABS
ORAL_TABLET | ORAL | Status: AC
  Filled 2014-07-28: qty 2

## 2014-07-28 NOTE — Progress Notes (Signed)
Pt transferred to floor at 1820 (rm 1426) by this RN and Elray Buba, RN.  Report called to Shirlee Limerick, RN by Selena Lesser, NP.  Pt stable at time of transfer.  Shirlee Limerick, RN notified to watch for signs of skin breakdown to perineal area due to diarrhea.  No noticeable breakdown at time of admission.  RN verbalized understanding

## 2014-07-28 NOTE — Telephone Encounter (Signed)
Patient called to say her "nerves got all tore up" last evening and she is having diarrhea this morning.  She has had one diarrhea stool and took two Imodium.  Advised her to repeat Imodium if she has more diarrhea stools.  Confirmed that she has just taken her Xanax XR this morning.  Invited her to call back, if this does not resolve the situation.

## 2014-07-28 NOTE — H&P (Addendum)
[facility] History and Physical  [name] [id number]:[id number] DOB: 25-Nov-1955 DOA: 07/28/2014  Referring physician: ER physician. PCP: [name], NP   Chief Complaint: Fever.  HPI: [name] is a 59 y.o. female history of ovarian cancer and breast cancer, diabetes mellitus type 2, chronic atrial fibrillation on Coumadin, CHF, hypertension was referred to the ER after patient was found to have febrile neutropenia. Patient states the last 2 days patient has been having diarrhea. Which has acutely worsened today. Patient also had one episode of nausea and vomiting. Patient had gone to her oncologist office and I will that patient was found to be febrile with blood counts showing leukopenia and was referred to the ER. Patient was given fluid bolus. On exam patient denies any chest pain shortness of breath abdominal pain. Patient did have multiple episodes of diarrhea. Patient had received chemotherapy 4 days ago. Patient on admission initially was having A. fib with RVR which improved with fluid bolus.   Review of Systems: As presented in the history of presenting illness, rest negative.  Past Medical History  Diagnosis Date  . Depression   . DVT (deep venous thrombosis)     hx of on HRT left leg ~2006  . GERD (gastroesophageal reflux disease)   . Hyperlipidemia   . Hypertension   . Hypothyroidism   . PPD positive, treated     rx inh   . Diabetes mellitus   . 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 DJD (degenerative joint disease) of lumbar spine   . Chronic diastolic CHF (congestive heart failure)     a. 08/2012 Echo: EF 55-60%, no rwma, mod MR.  [name] A-fib     a. on amio for rate control/coumadin.  [name] Physical deconditioning   . Allergy   . Anemia     low iron hx  . Blood transfusion without reported diagnosis     2 u prbc  . Cataract     removed ou  . OSA on CPAP     cpap 4.5 setting  . Breast cancer 2014    a. 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 Tubal ligation    . Cholecystectomy    . Foot surgery    . Eye surgery    . Cataract extraction    . Percutaneous liver biopsy    . Breast biopsy      left breast  . Mastectomy modified radical  06/08/2012    Procedure: MASTECTOMY MODIFIED RADICAL;  Surgeon: Edward Jolly, MD;  Location: [facility];  Service: General;  Laterality: Right;  . Portacath placement  06/08/2012    Procedure: INSERTION PORT-A-CATH;  Surgeon: Edward Jolly, MD;  Location: [facility];  Service: General;  Laterality: Left;  . Peg tube placement      peg removed 2015  . History of chemotherapy x 2 treatments, radiation tx  2014  . Pac removed  2015  . Laparotomy N/A 06/27/2014    Procedure: EXPLORATORY LAPAROTOMY;  Surgeon: Everitt Amber, MD;  Location: [facility];  Service: Gynecology;  Laterality: N/A;  . Abdominal hysterectomy N/A 06/27/2014    Procedure: HYSTERECTOMY ABDOMINAL TOTAL;  Surgeon: Everitt Amber, MD;  Location: [facility];  Service: Gynecology;  Laterality: N/A;  . Salpingoophorectomy Bilateral 06/27/2014    Procedure: BILATERAL SALPINGO  OOPHORECTOMY/TUMOR DEBULKING/LYMPHNODE DISSECTION, OMENTECTOMY;  Surgeon: Everitt Amber, MD;  Location: [facility];  Service: Gynecology;  Laterality: Bilateral;   Social History:  reports that she has quit smoking. She has never used smokeless tobacco. She reports that she does not drink alcohol or use illicit drugs. Where does patient live home. Can patient participate in ADLs? Yes.  Allergies  Allergen Reactions  . Olmesartan Medoxomil Cough    REACTION: ? if cough  . Tetracycline Hcl     Unknown reaction, too long for patient to remember   . Venlafaxine     REACTION: severe dry moouth  . Adhesive [Tape] Rash    Family History:  Family History  Problem Relation Age of Onset  . Diabetes Mother    . Hypertension Mother   . Arthritis Mother   . Heart disease Mother   . Heart failure Mother   . Other Mother     benign breast mass  . Stroke Father   . Heart disease Father   . Diabetes Paternal Grandmother   . Colon cancer Paternal Grandfather       Prior to Admission medications   Medication Sig Start Date End Date Taking? Authorizing Provider  ALPRAZolam (XANAX XR) 1 MG 24 hr tablet Take 1 tablet (1 mg total) by mouth 2 (two) times daily. 07/05/14  Yes Chauncey Cruel, MD  buPROPion (WELLBUTRIN) 75 MG tablet Take 1 tablet (75 mg total) by mouth 2 (two) times daily. 05/25/14  Yes Debbrah Alar, NP  clonazePAM (KLONOPIN) 0.5 MG tablet TAKE 1/2 TABLET TWICE DAILY 07/12/14  Yes Debbrah Alar, NP  dexamethasone (DECADRON) 4 MG tablet Take 2 tablets (8 mg total) by mouth 2 (two) times daily with a meal. Start the day after chemotherapy for 3 days. 07/14/14  Yes Chauncey Cruel, MD  escitalopram (LEXAPRO) 20 MG tablet Take 1 tablet (20 mg total) by mouth daily. 05/23/14  Yes Debbrah Alar, NP  furosemide (LASIX) 20 MG tablet Take 1 tablet (20 mg total) by mouth daily. 05/23/14  Yes Debbrah Alar, NP  letrozole (FEMARA) 2.5 MG tablet Take 1 tablet (2.5 mg total) by mouth daily. 05/22/14  Yes Chauncey Cruel, MD  levothyroxine (SYNTHROID, LEVOTHROID) 150 MCG tablet Take 1 tablet (150 mcg total) by mouth daily. 05/23/14  Yes Debbrah Alar, NP  lidocaine-prilocaine (EMLA) cream Apply over port area 1-2 hours before chemotherapy 07/14/14  Yes Chauncey Cruel, MD  LORazepam (ATIVAN) 0.5 MG tablet Take 1 tablet (0.5 mg total) by mouth at bedtime as needed for anxiety. 07/14/14  Yes Chauncey Cruel, MD  metFORMIN (GLUCOPHAGE) 500 MG tablet Take 1 tablet (500 mg total) by mouth 2 (two) times daily with a meal. 05/23/14  Yes Debbrah Alar, NP  metoprolol tartrate (LOPRESSOR) 25 MG tablet Take 1 tablet (25 mg total) by mouth 2 (two) times daily. 05/23/14  Yes Debbrah Alar,  NP  omeprazole (PRILOSEC) 20 MG capsule 1 tab po bid Patient taking differently: Take 20 mg by mouth daily as needed (FOR REFLUX).  04/26/14  Yes Meriam Sprague Saguier, PA-C  ondansetron (ZOFRAN) 8 MG tablet Take 1 tablet (8 mg total) by mouth 2 (two) times daily. Start the day after chemo for 3 days. Then take as needed for nausea or vomiting. 07/14/14  Yes Chauncey Cruel, MD  potassium chloride (K-DUR) 10 MEQ tablet Take 10 mEq by mouth 2 (two) times daily. 04/18/14  Yes Historical Provider, MD  spironolactone (ALDACTONE) 25 MG tablet Take 1 tablet (25  mg total) by mouth daily. 05/23/14  Yes Debbrah Alar, NP  warfarin (COUMADIN) 3 MG tablet Take 1-2 tablets (3-6 mg total) by mouth daily. as directed by coumadin clinic Patient taking differently: Take 3-6 mg by mouth daily. Take 2 tablets (6 mg) by mouth on Sun, Tues, Thurs, Sat and Take 1 tablet (3 mg) by mouth on Mon, Wed, Fri. 05/22/14  Yes Thayer Headings, MD  enoxaparin (LOVENOX) 120 MG/0.8ML injection Inject 0.8 mLs (120 mg total) into the skin daily. Patient not taking: Reported on 07/28/2014 07/12/14   Chauncey Cruel, MD  OLANZapine (ZYPREXA) 2.5 MG tablet TAKE 1 TABLET NIGHTLY AT BEDTIME AS NEEDED FOR SLEEP OR anxiety 05/23/14   Debbrah Alar, NP  prochlorperazine (COMPAZINE) 10 MG tablet Take 1 tablet (10 mg total) by mouth every 6 (six) hours as needed (Nausea or vomiting). 07/14/14   Chauncey Cruel, MD    Physical Exam: Filed Vitals:   07/28/14 1833 07/28/14 2151  BP: 109/59 88/45  Pulse: 116 112  Temp: 98.6 F (37 C) 98.2 F (36.8 C)  TempSrc: Oral Oral  Resp: 18 17  SpO2: 95% 97%     General:  Well-developed and nourished.  Eyes: Anicteric no pallor.  ENT: No discharge from the ears eyes nose or mouth.  Neck: No mass felt.  Cardiovascular: S1 and S2 heard.  Respiratory: No rhonchi or crepitations.  Abdomen: Soft nontender bowel sounds present. No guarding or rigidity.  Skin: No rash.  Musculoskeletal: No  edema.  Psychiatric: Appears normal.  Neurologic: Alert and oriented to time place and person. Moves all extremities.  Labs on Admission:  Basic Metabolic Panel:  Recent Labs Lab 07/24/14 1110 07/28/14 1443  NA 138 132*  K 4.0 3.6  CO2 22 18*  GLUCOSE 117 296*  BUN 9.9 13.1  CREATININE 0.7 0.7  CALCIUM 9.1 7.8*   Liver Function Tests:  Recent Labs Lab 07/24/14 1110 07/28/14 1443  AST 70* 21  ALT 86* 42  ALKPHOS 120 102  BILITOT 1.67* 1.72*  PROT 6.8 5.3*  ALBUMIN 3.3* 2.7*   No results for input(s): LIPASE, AMYLASE in the last 168 hours. No results for input(s): AMMONIA in the last 168 hours. CBC:  Recent Labs Lab 07/24/14 1110 07/28/14 1443  WBC 2.2* < 0.2*  NEUTROABS 1.1*  --   HGB 14.0 13.5  HCT 43.5 38.6  MCV 92.2 88.3  PLT 119* 91*   Cardiac Enzymes: No results for input(s): CKTOTAL, CKMB, CKMBINDEX, TROPONINI in the last 168 hours.  BNP (last 3 results) No results for input(s): BNP in the last 8760 hours.  ProBNP (last 3 results) No results for input(s): PROBNP in the last 8760 hours.  CBG: No results for input(s): GLUCAP in the last 168 hours.  Radiological Exams on Admission: No results found.  EKG: Independently reviewed. Atrial fibrillation with RVR.  Assessment/Plan Principal Problem:   Febrile neutropenia Active Problems:   Thrombocytopenia   Chronic diastolic CHF (congestive heart failure), NYHA class 1   Chronic atrial fibrillation   Diabetes mellitus type 2, uncontrolled   Hypothyroidism   1. Sepsis with febrile neutropenia - source not clear. At this time will continue with aggressive IV hydration and hold antihypertensives and patient has been placed on vancomycin and cefepime empirically for febrile neutropenia. Follow blood cultures urine cultures. Check Lactic acid levels. I have placed patient on stress dose steroids. Check influenza PCR. 2. A. fib with RVR - probably precipitated by sepsis. Continue hydration and  closely follow blood pressure trend's. Patient's heart rate has improved with fluids but will continue to monitor. Patient's CHADS2 vasc score is more than 2. Patient is on Coumadin per pharmacy. 3. Diarrhea - check stool studies. Diarrhea may be related to the patient's chemotherapy. 4. Diabetes mellitus type 2 uncontrolled - probably secondary to recent use of steroids. Closely follow CBG with sliding scale coverage. 5. Thrombocytopenia - may be related to chemotherapy but since patient is septic could also be due to infectious causes. Closely follow CBC and if there's any further worsening of platelets may have to check for DIC. 6. History of diastolic CHF last EF measured was 55-60% - presently patient is septic. Holding diuretics and patient is receiving IV fluids. 7. Elevated LFTs - could be related to patient's sepsis. Check sonogram of abdomen since patient had nausea vomiting. 8. Hypothyroidism - on Synthroid.  Chest x-ray is pending.   DVT Prophylaxis on Coumadin.  Code Status: Full code.  Family Communication: Patient's husband at the bedside.  Disposition Plan: Admit to inpatient.    Rhonda Leyda N. [facility] Pager [phone].  If 7PM-7AM, please contact night-coverage [email or web address] Password [facility] 07/28/2014, 10:04 PM

## 2014-07-28 NOTE — Progress Notes (Signed)
ANTIBIOTIC CONSULT NOTE - INITIAL  Pharmacy Consult for vancomycin, cefepime Indication: febrile neutropenia  Allergies  Allergen Reactions  . Olmesartan Medoxomil Cough    REACTION: ? if cough  . Tetracycline Hcl     Unknown reaction, too long for patient to remember   . Venlafaxine     REACTION: severe dry moouth  . Adhesive [Tape] Rash    Patient Measurements:    Vital Signs: Temp: 98.6 F (37 C) (03/18 1833) Temp Source: Oral (03/18 1833) BP: 109/59 mmHg (03/18 1833) Pulse Rate: 116 (03/18 1833) Intake/Output from previous day:   Intake/Output from this shift:    Labs:  Recent Labs  07/28/14 1443 07/28/14 1443  WBC < 0.2*  --   HGB 13.5  --   PLT 91*  --   CREATININE  --  0.7   Estimated Creatinine Clearance: 75.9 mL/min (by C-G formula based on Cr of 0.7). No results for input(s): VANCOTROUGH, VANCOPEAK, VANCORANDOM, GENTTROUGH, GENTPEAK, GENTRANDOM, TOBRATROUGH, TOBRAPEAK, TOBRARND, AMIKACINPEAK, AMIKACINTROU, AMIKACIN in the last 72 hours.   Microbiology: Recent Results (from the past 720 hour(s))  Clostridium Difficile by PCR     Status: None   Collection Time: 06/30/14  8:29 AM  Result Value Ref Range Status   C difficile by pcr NEGATIVE NEGATIVE Final    Comment: Performed at [facility]  TECHNOLOGIST REVIEW     Status: None   Collection Time: 07/28/14  2:43 PM  Result Value Ref Range Status   Technologist Review Occasional neutrophil and lymph seen on scan   Final    Medical History: Past Medical History  Diagnosis Date  . Depression   . DVT (deep venous thrombosis)     hx of on HRT left leg ~2006  . GERD (gastroesophageal reflux disease)   . Hyperlipidemia   . Hypertension   . Hypothyroidism   . PPD positive, treated     rx inh   . Diabetes mellitus   . 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 DJD (degenerative joint disease) of lumbar spine   . Chronic diastolic CHF (congestive heart failure)     a.  08/2012 Echo: EF 55-60%, no rwma, mod MR.  [name] A-fib     a. on amio for rate control/coumadin.  [name] Physical deconditioning   . Allergy   . Anemia     low iron hx  . Blood transfusion without reported diagnosis     2 u prbc  . Cataract     removed ou  . OSA on CPAP     cpap 4.5 setting  . Breast cancer 2014    a. 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 sodium chloride  1,000 mL Intravenous Once   Infusions:     Assessment: 59 yo presents to [facility] with FN to treat with vanc/cefepime per pharmacy dosing. Patient with breast cancer currently receiving letrozole and cabo/Taxol and cycle 2 just completed 3/14 with Neulasta given.   3/18 >> vanc >> 3/18 >> cefepime >>   Tmax: 100.5 WBCs: <0.2 Renal: SCr 0.7, CrCl 76  3/18 blood: 3/18 urine:   Goal of Therapy:  Vancomycin trough level 15-20 mcg/ml  Plan:  1) Vancomycin 1g IV q12 2) Cefepime 2g IV q8   Adrian Saran, PharmD, BCPS Pager [phone] 07/28/2014 8:46 PM

## 2014-07-29 ENCOUNTER — Inpatient Hospital Stay (HOSPITAL_COMMUNITY): Payer: BLUE CROSS/BLUE SHIELD

## 2014-07-29 ENCOUNTER — Encounter: Payer: Self-pay | Admitting: Nurse Practitioner

## 2014-07-29 ENCOUNTER — Other Ambulatory Visit: Payer: Self-pay | Admitting: Nurse Practitioner

## 2014-07-29 DIAGNOSIS — R6521 Severe sepsis with septic shock: Secondary | ICD-10-CM

## 2014-07-29 DIAGNOSIS — D701 Agranulocytosis secondary to cancer chemotherapy: Secondary | ICD-10-CM

## 2014-07-29 DIAGNOSIS — R197 Diarrhea, unspecified: Secondary | ICD-10-CM | POA: Insufficient documentation

## 2014-07-29 DIAGNOSIS — R531 Weakness: Secondary | ICD-10-CM | POA: Insufficient documentation

## 2014-07-29 DIAGNOSIS — R112 Nausea with vomiting, unspecified: Secondary | ICD-10-CM | POA: Insufficient documentation

## 2014-07-29 DIAGNOSIS — R5081 Fever presenting with conditions classified elsewhere: Secondary | ICD-10-CM

## 2014-07-29 DIAGNOSIS — E860 Dehydration: Secondary | ICD-10-CM | POA: Insufficient documentation

## 2014-07-29 DIAGNOSIS — D709 Neutropenia, unspecified: Secondary | ICD-10-CM | POA: Insufficient documentation

## 2014-07-29 DIAGNOSIS — R630 Anorexia: Secondary | ICD-10-CM | POA: Insufficient documentation

## 2014-07-29 DIAGNOSIS — R7989 Other specified abnormal findings of blood chemistry: Secondary | ICD-10-CM

## 2014-07-29 DIAGNOSIS — A419 Sepsis, unspecified organism: Secondary | ICD-10-CM

## 2014-07-29 DIAGNOSIS — Z7901 Long term (current) use of anticoagulants: Secondary | ICD-10-CM | POA: Insufficient documentation

## 2014-07-29 DIAGNOSIS — R509 Fever, unspecified: Secondary | ICD-10-CM

## 2014-07-29 DIAGNOSIS — E8809 Other disorders of plasma-protein metabolism, not elsewhere classified: Secondary | ICD-10-CM | POA: Insufficient documentation

## 2014-07-29 DIAGNOSIS — D702 Other drug-induced agranulocytosis: Secondary | ICD-10-CM

## 2014-07-29 DIAGNOSIS — D696 Thrombocytopenia, unspecified: Secondary | ICD-10-CM

## 2014-07-29 LAB — LACTIC ACID, PLASMA
LACTIC ACID, VENOUS: 7.7 mmol/L — AB (ref 0.5–2.0)
Lactic Acid, Venous: 5.8 mmol/L (ref 0.5–2.0)
Lactic Acid, Venous: 6 mmol/L (ref 0.5–2.0)

## 2014-07-29 LAB — CBC WITH DIFFERENTIAL/PLATELET
Basophils Absolute: 0 10*3/uL (ref 0.0–0.1)
Basophils Relative: 4 % — ABNORMAL HIGH (ref 0–1)
EOS ABS: 0 10*3/uL (ref 0.0–0.7)
Eosinophils Relative: 0 % (ref 0–5)
HEMATOCRIT: 36.5 % (ref 36.0–46.0)
Hemoglobin: 12.7 g/dL (ref 12.0–15.0)
LYMPHS PCT: 14 % (ref 12–46)
Lymphs Abs: 0 10*3/uL — ABNORMAL LOW (ref 0.7–4.0)
MCH: 30.9 pg (ref 26.0–34.0)
MCHC: 34.8 g/dL (ref 30.0–36.0)
MCV: 88.8 fL (ref 78.0–100.0)
MONO ABS: 0.2 10*3/uL (ref 0.1–1.0)
Monocytes Relative: 59 % — ABNORMAL HIGH (ref 3–12)
NEUTROS ABS: 0 10*3/uL — AB (ref 1.7–7.7)
Neutrophils Relative %: 23 % — ABNORMAL LOW (ref 43–77)
Platelets: 74 10*3/uL — ABNORMAL LOW (ref 150–400)
RBC: 4.11 MIL/uL (ref 3.87–5.11)
RDW: 13.7 % (ref 11.5–15.5)
WBC: 0.2 10*3/uL — AB (ref 4.0–10.5)

## 2014-07-29 LAB — GLUCOSE, CAPILLARY
GLUCOSE-CAPILLARY: 229 mg/dL — AB (ref 70–99)
GLUCOSE-CAPILLARY: 139 mg/dL — AB (ref 70–99)
Glucose-Capillary: 166 mg/dL — ABNORMAL HIGH (ref 70–99)
Glucose-Capillary: 137 mg/dL — ABNORMAL HIGH (ref 70–99)

## 2014-07-29 LAB — COMPREHENSIVE METABOLIC PANEL
ALT: 36 U/L — AB (ref 0–35)
ANION GAP: 11 (ref 5–15)
AST: 36 U/L (ref 0–37)
Albumin: 2.1 g/dL — ABNORMAL LOW (ref 3.5–5.2)
Alkaline Phosphatase: 67 U/L (ref 39–117)
BUN: 25 mg/dL — ABNORMAL HIGH (ref 6–23)
CALCIUM: 6.5 mg/dL — AB (ref 8.4–10.5)
CO2: 11 mmol/L — AB (ref 19–32)
Chloride: 109 mmol/L (ref 96–112)
Creatinine, Ser: 1.14 mg/dL — ABNORMAL HIGH (ref 0.50–1.10)
GFR calc non Af Amer: 51 mL/min — ABNORMAL LOW (ref >90)
GFR, EST AFRICAN AMERICAN: 59 mL/min — AB (ref >90)
Glucose, Bld: 163 mg/dL — ABNORMAL HIGH (ref 70–99)
Potassium: 3.7 mmol/L (ref 3.5–5.1)
Sodium: 131 mmol/L — ABNORMAL LOW (ref 135–145)
TOTAL PROTEIN: 4.4 g/dL — AB (ref 6.0–8.3)
Total Bilirubin: 1.3 mg/dL — ABNORMAL HIGH (ref 0.3–1.2)

## 2014-07-29 LAB — CARBOXYHEMOGLOBIN
CARBOXYHEMOGLOBIN: 1.2 % (ref 0.5–1.5)
METHEMOGLOBIN: 0.7 % (ref 0.0–1.5)
O2 Saturation: 63.8 %
TOTAL HEMOGLOBIN: 12.9 g/dL (ref 12.0–16.0)

## 2014-07-29 LAB — BLOOD GAS, ARTERIAL
ACID-BASE DEFICIT: 12.3 mmol/L — AB (ref 0.0–2.0)
Bicarbonate: 11.6 mEq/L — ABNORMAL LOW (ref 20.0–24.0)
Drawn by: 270211
O2 Content: 2 L/min
O2 SAT: 95.6 %
PO2 ART: 81 mmHg (ref 80.0–100.0)
Patient temperature: 98.6
TCO2: 10.6 mmol/L (ref 0–100)
pCO2 arterial: 21.6 mmHg — ABNORMAL LOW (ref 35.0–45.0)
pH, Arterial: 7.348 — ABNORMAL LOW (ref 7.350–7.450)

## 2014-07-29 LAB — URINALYSIS, ROUTINE W REFLEX MICROSCOPIC
Bilirubin Urine: NEGATIVE
Glucose, UA: 100 mg/dL — AB
HGB URINE DIPSTICK: NEGATIVE
Ketones, ur: NEGATIVE mg/dL
LEUKOCYTES UA: NEGATIVE
Nitrite: NEGATIVE
Protein, ur: NEGATIVE mg/dL
SPECIFIC GRAVITY, URINE: 1.02 (ref 1.005–1.030)
Urobilinogen, UA: 0.2 mg/dL (ref 0.0–1.0)
pH: 5 (ref 5.0–8.0)

## 2014-07-29 LAB — PROTIME-INR
INR: 2.8 — ABNORMAL HIGH (ref 0.00–1.49)
INR: 6.02 — AB (ref 0.00–1.49)
Prothrombin Time: 29.7 seconds — ABNORMAL HIGH (ref 11.6–15.2)
Prothrombin Time: 54.1 seconds — ABNORMAL HIGH (ref 11.6–15.2)

## 2014-07-29 LAB — BASIC METABOLIC PANEL
ANION GAP: 11 (ref 5–15)
BUN: 21 mg/dL (ref 6–23)
CALCIUM: 6.6 mg/dL — AB (ref 8.4–10.5)
CO2: 13 mmol/L — AB (ref 19–32)
Chloride: 108 mmol/L (ref 96–112)
Creatinine, Ser: 0.99 mg/dL (ref 0.50–1.10)
GFR calc Af Amer: 70 mL/min — ABNORMAL LOW (ref >90)
GFR calc non Af Amer: 61 mL/min — ABNORMAL LOW (ref >90)
Glucose, Bld: 213 mg/dL — ABNORMAL HIGH (ref 70–99)
Potassium: 3.4 mmol/L — ABNORMAL LOW (ref 3.5–5.1)
Sodium: 132 mmol/L — ABNORMAL LOW (ref 135–145)

## 2014-07-29 LAB — TROPONIN I: Troponin I: <0.03 ng/mL (ref <0.031)

## 2014-07-29 LAB — INFLUENZA PANEL BY PCR (TYPE A & B)
H1N1 flu by pcr: NOT DETECTED
INFLBPCR: NEGATIVE
Influenza A By PCR: NEGATIVE

## 2014-07-29 LAB — TYPE AND SCREEN
ABO/RH(D): O POS
Antibody Screen: NEGATIVE

## 2014-07-29 LAB — MRSA PCR SCREENING: MRSA by PCR: NEGATIVE

## 2014-07-29 LAB — TSH: TSH: 0.514 u[IU]/mL (ref 0.350–4.500)

## 2014-07-29 MED ORDER — HYDROCORTISONE NA SUCCINATE PF 100 MG IJ SOLR
50.0000 mg | Freq: Three times a day (TID) | INTRAMUSCULAR | Status: DC
  Filled 2014-07-29: qty 2

## 2014-07-29 MED ORDER — SODIUM CHLORIDE 0.9 % IV SOLN
Freq: Once | INTRAVENOUS | Status: AC
  Administered 2014-07-29: 10:00:00 via INTRAVENOUS

## 2014-07-29 MED ORDER — NOREPINEPHRINE BITARTRATE 1 MG/ML IV SOLN
5.0000 ug/min | INTRAVENOUS | Status: DC
  Filled 2014-07-29: qty 4

## 2014-07-29 MED ORDER — SODIUM CHLORIDE 0.9 % IV BOLUS (SEPSIS)
500.0000 mL | Freq: Once | INTRAVENOUS | Status: AC
Start: 2014-07-29 — End: 2014-07-29
  Administered 2014-07-29: 500 mL via INTRAVENOUS

## 2014-07-29 MED ORDER — WARFARIN - PHARMACIST DOSING INPATIENT: Freq: Every day | Status: DC

## 2014-07-29 MED ORDER — SODIUM CHLORIDE 0.9 % IJ SOLN
10.0000 mL | Freq: Two times a day (BID) | INTRAMUSCULAR | Status: DC
  Administered 2014-07-29 – 2014-08-04 (×8): 10 mL

## 2014-07-29 MED ORDER — SODIUM CHLORIDE 0.9 % IV SOLN
100.0000 mg | Freq: Every day | INTRAVENOUS | Status: DC
  Administered 2014-07-29: 100 mg via INTRAVENOUS
  Filled 2014-07-29: qty 100

## 2014-07-29 MED ORDER — ACETAMINOPHEN 10 MG/ML IV SOLN
1000.0000 mg | Freq: Four times a day (QID) | INTRAVENOUS | Status: DC
  Administered 2014-07-29: 1000 mg via INTRAVENOUS
  Filled 2014-07-29 (×4): qty 100

## 2014-07-29 MED ORDER — SODIUM CHLORIDE 0.9 % IV SOLN: INTRAVENOUS | Status: DC

## 2014-07-29 MED ORDER — CIPROFLOXACIN IN D5W 400 MG/200ML IV SOLN
400.0000 mg | Freq: Two times a day (BID) | INTRAVENOUS | Status: DC
  Administered 2014-07-29: 400 mg via INTRAVENOUS
  Filled 2014-07-29: qty 200

## 2014-07-29 MED ORDER — FILGRASTIM 480 MCG/1.6ML IJ SOLN
480.0000 ug | Freq: Every day | INTRAVENOUS | Status: AC
  Administered 2014-07-29 – 2014-07-31 (×3): 480 ug via INTRAVENOUS
  Filled 2014-07-29 (×3): qty 1.6

## 2014-07-29 MED ORDER — SODIUM CHLORIDE 0.9 % IV BOLUS (SEPSIS)
1000.0000 mL | Freq: Once | INTRAVENOUS | Status: AC
  Administered 2014-07-29: 1000 mL via INTRAVENOUS

## 2014-07-29 MED ORDER — HYDROCORTISONE NA SUCCINATE PF 100 MG IJ SOLR: 100.0000 mg | Freq: Four times a day (QID) | INTRAMUSCULAR | Status: DC

## 2014-07-29 MED ORDER — ALPRAZOLAM 0.25 MG PO TABS
0.2500 mg | ORAL_TABLET | Freq: Four times a day (QID) | ORAL | Status: DC | PRN
  Administered 2014-07-29 – 2014-07-30 (×2): 0.25 mg via ORAL
  Filled 2014-07-29 (×2): qty 1

## 2014-07-29 MED ORDER — HYDROCORTISONE NA SUCCINATE PF 100 MG IJ SOLR
50.0000 mg | Freq: Four times a day (QID) | INTRAMUSCULAR | Status: DC
  Administered 2014-07-29 – 2014-07-31 (×9): 50 mg via INTRAVENOUS
  Filled 2014-07-29 (×9): qty 2

## 2014-07-29 MED ORDER — SODIUM CHLORIDE 0.9 % IJ SOLN
10.0000 mL | INTRAMUSCULAR | Status: DC | PRN
  Administered 2014-08-06 – 2014-08-08 (×3): 10 mL
  Filled 2014-07-29 (×3): qty 40

## 2014-07-29 MED ORDER — METRONIDAZOLE IN NACL 5-0.79 MG/ML-% IV SOLN
500.0000 mg | Freq: Four times a day (QID) | INTRAVENOUS | Status: DC
  Administered 2014-07-29 – 2014-07-30 (×4): 500 mg via INTRAVENOUS
  Filled 2014-07-29 (×4): qty 100

## 2014-07-29 MED ORDER — VITAMIN K1 10 MG/ML IJ SOLN
2.0000 mg | Freq: Once | INTRAVENOUS | Status: AC
  Administered 2014-07-29: 2 mg via INTRAVENOUS
  Filled 2014-07-29: qty 0.2

## 2014-07-29 MED ORDER — SODIUM CHLORIDE 0.9 % IV SOLN: 100.0000 mg | Freq: Every day | INTRAVENOUS | Status: DC

## 2014-07-29 MED ORDER — SODIUM CHLORIDE 0.9 % IV BOLUS (SEPSIS)
500.0000 mL | Freq: Once | INTRAVENOUS | Status: AC
  Administered 2014-07-29: 500 mL via INTRAVENOUS

## 2014-07-29 MED ORDER — NOREPINEPHRINE BITARTRATE 1 MG/ML IV SOLN
0.0000 ug/min | INTRAVENOUS | Status: DC
  Administered 2014-07-29: 26 ug/min via INTRAVENOUS
  Administered 2014-07-29: 6 ug/min via INTRAVENOUS
  Filled 2014-07-29: qty 4

## 2014-07-29 MED ORDER — SODIUM CHLORIDE 0.9 % IV SOLN
INTRAVENOUS | Status: DC
  Administered 2014-07-29 – 2014-07-30 (×2): via INTRAVENOUS

## 2014-07-29 MED ORDER — VANCOMYCIN 50 MG/ML ORAL SOLUTION
500.0000 mg | Freq: Four times a day (QID) | ORAL | Status: DC
  Filled 2014-07-29 (×5): qty 10

## 2014-07-29 MED ORDER — NOREPINEPHRINE BITARTRATE 1 MG/ML IV SOLN
5.0000 ug/min | INTRAVENOUS | Status: DC
  Filled 2014-07-29: qty 16

## 2014-07-29 MED ORDER — POTASSIUM CHLORIDE 10 MEQ/50ML IV SOLN
10.0000 meq | INTRAVENOUS | Status: AC
  Administered 2014-07-29 (×4): 10 meq via INTRAVENOUS
  Filled 2014-07-29 (×4): qty 50

## 2014-07-29 MED ORDER — TBO-FILGRASTIM 480 MCG/0.8ML ~~LOC~~ SOSY
480.0000 ug | PREFILLED_SYRINGE | Freq: Once | SUBCUTANEOUS | Status: DC
  Filled 2014-07-29: qty 0.8

## 2014-07-29 MED ORDER — INSULIN ASPART 100 UNIT/ML ~~LOC~~ SOLN
0.0000 [IU] | SUBCUTANEOUS | Status: DC
  Administered 2014-07-29: 3 [IU] via SUBCUTANEOUS
  Administered 2014-07-29 (×2): 1 [IU] via SUBCUTANEOUS
  Administered 2014-07-30: 2 [IU] via SUBCUTANEOUS
  Administered 2014-07-30: 5 [IU] via SUBCUTANEOUS
  Administered 2014-07-30: 3 [IU] via SUBCUTANEOUS
  Administered 2014-07-30: 2 [IU] via SUBCUTANEOUS
  Administered 2014-07-30 (×3): 1 [IU] via SUBCUTANEOUS
  Administered 2014-07-31: 2 [IU] via SUBCUTANEOUS
  Administered 2014-07-31: 3 [IU] via SUBCUTANEOUS
  Administered 2014-07-31 – 2014-08-01 (×5): 2 [IU] via SUBCUTANEOUS
  Administered 2014-08-01: 1 [IU] via SUBCUTANEOUS
  Administered 2014-08-01 – 2014-08-02 (×5): 2 [IU] via SUBCUTANEOUS
  Administered 2014-08-02 (×2): 1 [IU] via SUBCUTANEOUS

## 2014-07-29 NOTE — Assessment & Plan Note (Signed)
Albumin has decreased to 2.7.  Patient was encouraged to push protein in her diet is much as possible.

## 2014-07-29 NOTE — Progress Notes (Signed)
BIPAP black box placed on pt with 2LPM bleed in. Vitals stable, pt states she is comfortable.

## 2014-07-29 NOTE — Progress Notes (Signed)
[facility] PROGRESS NOTE  [name] WNU:[id number] DOB: Sep 26, 1955 DOA: 07/28/2014 PCP: [name], NP  Assessment/Plan: 1. Septic shock - Unknown source - Blood cx gowing GNR per crit care - C.diff pending - Cont with vanc and cefepime. - did not respond to fluids 8L - Cont with levophed on 71mcg - Cont with IVF - Solucortef IV - F/u with cx.  2. Neutropenic fever -ANC- 0 - pan cx - Cont with abx. - neutropenic precautions  3. Neutropenia - recived neupogen - Consult hemonc -Cont with neupogen  4. H/O breast and ovarian CA -undergoing chemotherapy  5. Diarrhea - C. Diff pending  6. Thrombocytopenia - 2/2 Chemo - F/u with  CBC  7. H/O DM - Hold metformin - Keep on SSI  8. A.fib - tachycardic 2/2 septic shock - Hold metoprolol - Supratherp INR - Hold coumadin  Code Status: zFull Family Communication: husband and patient at bedside Disposition Plan: pending clinical improvement   Consultants:  Crit care   Antibiotics: vanc and cefepime 3/18 HPI/Subjective: [name] is a 59 y.o. female history of ovarian cancer and breast cancer, diabetes mellitus type 2, chronic atrial fibrillation on Coumadin, CHF, hypertension was referred to the ER after patient was found to have febrile neutropenia. Patient states the last 2 days patient has been having diarrhea. Which has acutely worsened today. Patient also had one episode of nausea and vomiting. Patient had gone to her oncologist office and I will that patient was found to be febrile with blood counts showing leukopenia and was referred to the ER. Patient was given fluid bolus. On exam patient denies any chest pain shortness of breath abdominal pain. Patient did have multiple episodes of diarrhea. Patient had received chemotherapy 4 days ago. Patient on admission initially was having A. fib with RVR which improved with fluid bolus.   Objective: Filed Vitals:   07/29/14 1320  BP:    Pulse: 101  Temp: 98.4 F (36.9 C)  Resp: 25    Intake/Output Summary (Last 24 hours) at 07/29/14 1332 Last data filed at 07/29/14 1320  Gross per 24 hour  Intake 6123.5 ml  Output    410 ml  Net 5713.5 ml   Filed Weights   07/29/14 0200  Weight: 77.111 kg (170 lb)    Exam:   General: Well-developed and nourished. Looks toxic looking  Eyes: Anicteric no pallor.  ENT: No discharge from the ears eyes nose or mouth.  Neck: No mass felt.  Cardiovascular: S1 and S2 heard.  Respiratory: No rhonchi or crepitations.  Abdomen: Soft nontender bowel sounds present. No guarding or rigidity.  Skin: No rash.  Musculoskeletal: No edema.  Psychiatric: Appears normal.  Neurologic: Alert and oriented to time place and person. Moves all extremities.   Data Reviewed: Basic Metabolic Panel:  Recent Labs Lab 07/24/14 1110 07/28/14 1443 07/29/14 0956  NA 138 132* 131*  K 4.0 3.6 3.7  CL  --   --  109  CO2 22 18* 11*  GLUCOSE 117 296* 163*  BUN 9.9 13.1 25*  CREATININE 0.7 0.7 1.14*  CALCIUM 9.1 7.8* 6.5*   Liver Function Tests:  Recent Labs Lab 07/24/14 1110 07/28/14 1443 07/29/14 0956  AST 70* 21 36  ALT 86* 42 36*  ALKPHOS 120 102 67  BILITOT 1.67* 1.72* 1.3*  PROT 6.8 5.3* 4.4*  ALBUMIN 3.3* 2.7* 2.1*   No results for input(s): LIPASE, AMYLASE in the last 168 hours. No results for input(s): AMMONIA in  the last 168 hours. CBC:  Recent Labs Lab 07/24/14 1110 07/28/14 1443 07/29/14 0956  WBC 2.2* < 0.2* 0.2*  NEUTROABS 1.1*  --  0.0*  HGB 14.0 13.5 12.7  HCT 43.5 38.6 36.5  MCV 92.2 88.3 88.8  PLT 119* 91* 74*   Cardiac Enzymes:  Recent Labs Lab 07/29/14 0626  TROPONINI <0.03   BNP (last 3 results) No results for input(s): BNP in the last 8760 hours.  ProBNP (last 3 results) No results for input(s): PROBNP in the last 8760 hours.  CBG:  Recent Labs Lab 07/29/14 0207 07/29/14 0920 07/29/14 1226  GLUCAP 166* 137* 229*     Recent Results (from the past 240 hour(s))  TECHNOLOGIST REVIEW     Status: None   Collection Time: 07/28/14  2:43 PM  Result Value Ref Range Status   Technologist Review Occasional neutrophil and lymph seen on scan   Final  MRSA PCR Screening     Status: None   Collection Time: 07/29/14 10:31 AM  Result Value Ref Range Status   MRSA by PCR NEGATIVE NEGATIVE Final    Comment:        The GeneXpert MRSA Assay (FDA approved for NASAL specimens only), is one component of a comprehensive MRSA colonization surveillance program. It is not intended to diagnose MRSA infection nor to guide or monitor treatment for MRSA infections.      Studies: US Abdomen Complete  07/29/2014   CLINICAL DATA:  Initial evaluation elevated liver function enzymes, personal history of cholecystectomy, breast cancer, ovarian cancer, currently on chemotherapy  EXAM: ULTRASOUND ABDOMEN COMPLETE  COMPARISON:  None.  FINDINGS: Gallbladder: Surgically absent  Common bile duct: Diameter: 5 mm  Liver: Heterogeneous echotexture. No focal abnormalities. Trace fluid identified around the liver.  IVC: No abnormality visualized.  Pancreas: Not identified  Spleen: Normal with a span of 10 cm  Right Kidney: Length: 12.8 cm. Echogenicity within normal limits. No mass or hydronephrosis visualized.  Left Kidney: Length: 11.1 cm. Echogenicity within normal limits. No mass or hydronephrosis visualized.  Abdominal aorta: Not well seen.  Other findings: None.  IMPRESSION: Hit coarsened heterogeneous hepatic echotexture suggesting a hepatic parenchymal disease, used which can include steatosis. Left trace fluid seen around the liver.   Electronically Signed   By: Skipper Cliche M.D.   On: 07/29/2014 11:37   Dg Chest Port 1 View  07/29/2014   CLINICAL DATA:  Acute sepsis, heart failure, history of breast cancer  EXAM: PORTABLE CHEST - 1 VIEW  COMPARISON:  07/28/2014  FINDINGS: Left IJ port catheter tip SVC RA junction. New right IJ  central line tip lower SVC level. Heart is enlarged with slight increased vascular and interstitial changes concerning for developing edema. No focal pneumonia, collapse or consolidation. No effusion or pneumothorax. Trachea midline. Postop changes from the right breast surgery and axillary lymph node dissection.  IMPRESSION: Right IJ central line tip lower SVC level.  Mild developing edema pattern.   Electronically Signed   By: Jerilynn Mages.  Shick M.D.   On: 07/29/2014 09:54   Dg Chest Port 1 View  07/29/2014   CLINICAL DATA:  Fever and diarrhea for 2 days.  EXAM: PORTABLE CHEST - 1 VIEW  COMPARISON:  04/26/2014  FINDINGS: There is a left-sided Port-A-Cath with tip in the cavoatrial junction. There is generalized interstitial fluid or thickening which is increased from 04/26/2014. There is no airspace opacity. There is no large effusion.  IMPRESSION: Interstitial fluid or infiltrate, new from 04/26/2014.  Electronically Signed   By: Andreas Newport M.D.   On: 07/29/2014 00:32    Scheduled Meds: . ceFEPime (MAXIPIME) IV  2 g Intravenous 3 times per day  . ciprofloxacin  400 mg Intravenous Q12H  . filgrastim (NEUPOGEN) 480 mcg IVPB  480 mcg Intravenous Daily  . hydrocortisone sodium succinate  50 mg Intravenous Q6H  . insulin aspart  0-9 Units Subcutaneous 6 times per day  . letrozole  2.5 mg Oral Daily  . levothyroxine  150 mcg Oral QAC breakfast  . metronidazole  500 mg Intravenous 4 times per day  . pantoprazole  40 mg Oral Daily  . potassium chloride  10 mEq Oral BID  . sodium chloride  10-40 mL Intracatheter Q12H  . vancomycin  1,000 mg Intravenous Q12H   Continuous Infusions: . sodium chloride 125 mL/hr at 07/29/14 0400  . sodium chloride    . norepinephrine (LEVOPHED) Adult infusion      Principal Problem:   Febrile neutropenia Active Problems:   Thrombocytopenia   Chronic diastolic CHF (congestive heart failure), NYHA class 1   Chronic atrial fibrillation   Diabetes mellitus type 2,  uncontrolled   Hypothyroidism    Time spent: 30min crit care time   [facility] Pager [phone] If 7PM-7AM, please contact night-coverage at [email or web address], password [email or web address] 07/29/2014, 1:32 PM  LOS: 1 day

## 2014-07-29 NOTE — Progress Notes (Signed)
eLink Physician-Brief Progress Note Patient Name: [name] DOB: [birth year] MRN: [id number]   Date of Service  07/29/2014  HPI/Events of Note  70 F with ovarian CA admitted with diarrheal illness/hypotension/oliguia and elevated lactate.  Remains hypotensive despite at least 7 to 8 liters of IVF but continues to have liquid stools.  Current HR is trending down and in no resp distress.  Current BP of 52/36  eICU Interventions  Plan: 1 liter NS IV now Insert aline to verify BP PCCM to see patient at bedside this AM.     Intervention Category Major Interventions: Hypovolemia - evaluation and treatment with fluids;Hypotension - evaluation and management  Kerim Statzer 07/29/2014, 6:38 AM

## 2014-07-29 NOTE — Assessment & Plan Note (Addendum)
Patient is status post right breast mastectomy.  Currently undergoing letrozole oral therapy.

## 2014-07-29 NOTE — Assessment & Plan Note (Signed)
Hx of previous DVT in past; and currently taking coumadin.

## 2014-07-29 NOTE — Progress Notes (Signed)
CRITICAL VALUE ALERT  Critical value received:  Lactic acid 7.7  Date of notification:  07/29/2014  Time of notification:  0736  Critical value read back:yes  Nurse who received alert:  Lacinda Axon RN  MD notified (1st page):  Dr. Lunette Stands  Time of first page:  [phone]  MD notified (2nd page):n/a  Time of second page:n/a  Responding MD:  Dr. Lunette Stands  Time MD responded: [phone]

## 2014-07-29 NOTE — Assessment & Plan Note (Signed)
Chemo-induced thrombocytopenia noted; with plt count down to 91.  Pt denies any worsening issues with easy bleeding or bruising.

## 2014-07-29 NOTE — Progress Notes (Signed)
CRITICAL VALUE ALERT  Critical value received:  Lactic Acid 5.8;INR 6.02  Date of notification:  07/28/2014  Time of notification: 0140  Critical value read back:yes  Nurse who received alert: Dellie Catholic  MD notified (1st page):  yes  Time of first page:  07/28/14 @ 0142

## 2014-07-29 NOTE — Progress Notes (Signed)
SYMPTOM MANAGEMENT CLINIC   HPI: [name] 59 y.o. female diagnosed with both breast cancer and ovarian cancer.  Patient is status post  Mastectomy and recent hysterectomy.  Currently undergoing  Letrozole oral therapy for her breast cancer.  Also undergoing carboplatin Taxol chemotherapy regimen for treatment of her ovarian cancer.  Patient called the cancer Center today requesting urgent care visit.  Patient has been complaining of chronic nausea since initiating chemotherapy.  She reports vomiting once this morning; and has developed progressive diarrhea as well.  She reports having a temperature to maximum 100.2 earlier this morning; but was afebrile on initial check while at the [facility] today.  Patient also was complaining of minimal appetite and very poor oral intake.  She feels very dehydrated today.  She denies any specific pain whatsoever.  She denies any dysuria, hematuria, or frequency of urination.   Also, patient has a history of DVT and remains on Coumadin.   HPI  ROS  Past Medical History  Diagnosis Date  . Depression   . DVT (deep venous thrombosis)     hx of on HRT left leg ~2006  . GERD (gastroesophageal reflux disease)   . Hyperlipidemia   . Hypertension   . Hypothyroidism   . PPD positive, treated     rx inh   . Diabetes mellitus   . 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 DJD (degenerative joint disease) of lumbar spine   . Chronic diastolic CHF (congestive heart failure)     a. 08/2012 Echo: EF 55-60%, no rwma, mod MR.  [name] A-fib     a. on amio for rate control/coumadin.  [name] Physical deconditioning   . Allergy   . Anemia     low iron hx  . Blood transfusion without reported diagnosis     2 u prbc  . Cataract     removed ou  . OSA on CPAP     cpap 4.5 setting  . Breast cancer 2014    a. 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 Tubal ligation    . Cholecystectomy    . Foot surgery    . Eye surgery    . Cataract extraction    . Percutaneous liver biopsy    . Breast biopsy      left breast  . Mastectomy modified radical  06/08/2012    Procedure: MASTECTOMY MODIFIED RADICAL;  Surgeon: Edward Jolly, MD;  Location: [facility];  Service: General;  Laterality: Right;  . Portacath placement  06/08/2012    Procedure: INSERTION PORT-A-CATH;  Surgeon: Edward Jolly, MD;  Location: [facility];  Service: General;  Laterality: Left;  . Peg tube placement      peg removed 2015  . History of chemotherapy x 2 treatments, radiation tx  2014  . Pac removed  2015  . Laparotomy N/A 06/27/2014    Procedure: EXPLORATORY LAPAROTOMY;  Surgeon: Everitt Amber, MD;  Location: [facility];  Service: Gynecology;  Laterality: N/A;  . Abdominal hysterectomy N/A 06/27/2014    Procedure: HYSTERECTOMY ABDOMINAL TOTAL;  Surgeon: Everitt Amber, MD;  Location: [facility];  Service: Gynecology;  Laterality: N/A;  . Salpingoophorectomy Bilateral 06/27/2014    Procedure: BILATERAL SALPINGO OOPHORECTOMY/TUMOR DEBULKING/LYMPHNODE DISSECTION, OMENTECTOMY;  Surgeon: Everitt Amber, MD;  Location: [facility];  Service: Gynecology;  Laterality: Bilateral;    has HYPOTHYROIDISM; Diabetes type 2, controlled; Unspecified vitamin D deficiency; HYPERLIPIDEMIA; Morbid obesity; IRON DEFICIENCY; DEPRESSION; OBSTRUCTIVE SLEEP APNEA; HYPERTENSION; GERD; Hepatic steatosis; ARTHRITIS; SPLENOMEGALY; LIVER FUNCTION TESTS, ABNORMAL; POSITIVE PPD; DVT, HX OF; Liver disease, chronic, with cirrhosis; Allergic rhinitis, cause unspecified; Microcytic anemia; Thrombocytopenia; A-fib; DJD (degenerative joint disease) of lumbar spine; Chronic diastolic CHF (congestive heart failure), NYHA class 1; Benign paroxysmal positional vertigo; Left hip pain; Breast cancer, right breast; Cough; CAP (community acquired pneumonia); Pelvic mass;  Pelvic mass in female; Clear cell carcinoma of ovary, unspecified laterality; Atrial fibrillation, unspecified; Ovarian cancer; Hypersensitivity reaction; Febrile neutropenia; Chronic atrial fibrillation; Diabetes mellitus type 2, uncontrolled; Hypothyroidism; Severe sepsis with septic shock; Neutropenia, drug-induced; Nausea with vomiting; Diarrhea; Neutropenic fever; Dehydration; Weakness; Long term current use of anticoagulant therapy; Anorexia; Hyperbilirubinemia; and Hypoalbuminemia on her problem list.    is allergic to olmesartan medoxomil; tetracycline hcl; venlafaxine; and adhesive.    Medication List       This list is accurate as of: 07/28/14  6:23 PM.  Always use your most recent med list.               ALPRAZolam 1 MG 24 hr tablet  Commonly known as:  XANAX XR  Take 1 tablet (1 mg total) by mouth 2 (two) times daily.     buPROPion 75 MG tablet  Commonly known as:  WELLBUTRIN  Take 1 tablet (75 mg total) by mouth 2 (two) times daily.     clonazePAM 0.5 MG tablet  Commonly known as:  KLONOPIN  TAKE 1/2 TABLET TWICE DAILY     dexamethasone 4 MG tablet  Commonly known as:  DECADRON  Take 2 tablets (8 mg total) by mouth 2 (two) times daily with a meal. Start the day after chemotherapy for 3 days.     enoxaparin 120 MG/0.8ML injection  Commonly known as:  LOVENOX  Inject 0.8 mLs (120 mg total) into the skin daily.     escitalopram 20 MG tablet  Commonly known as:  LEXAPRO  Take 1 tablet (20 mg total) by mouth daily.     furosemide 20 MG tablet  Commonly known as:  LASIX  Take 1 tablet (20 mg total) by mouth daily.     letrozole 2.5 MG tablet  Commonly known as:  FEMARA  Take 1 tablet (2.5 mg total) by mouth daily.     levothyroxine 150 MCG tablet  Commonly known as:  SYNTHROID, LEVOTHROID  Take 1 tablet (150 mcg total) by mouth daily.     lidocaine-prilocaine cream  Commonly known as:  EMLA  Apply over port area 1-2 hours before chemotherapy     LORazepam  0.5 MG tablet  Commonly known as:  ATIVAN  Take 1 tablet (0.5 mg total) by mouth at bedtime as needed for anxiety.     metFORMIN 500 MG tablet  Commonly known as:  GLUCOPHAGE  Take 1 tablet (500 mg total) by mouth 2 (two) times daily with a meal.     metoprolol tartrate 25 MG tablet  Commonly known as:  LOPRESSOR  Take 1 tablet (25 mg total) by mouth 2 (two) times daily.     OLANZapine 2.5 MG tablet  Commonly known as:  ZYPREXA  TAKE 1 TABLET NIGHTLY AT BEDTIME AS NEEDED FOR SLEEP OR anxiety     omeprazole 20 MG capsule  Commonly known as:  PRILOSEC  1 tab po bid     ondansetron 8 MG  tablet  Commonly known as:  ZOFRAN  Take 1 tablet (8 mg total) by mouth 2 (two) times daily. Start the day after chemo for 3 days. Then take as needed for nausea or vomiting.     potassium chloride 10 MEQ tablet  Commonly known as:  K-DUR  Take 10 mEq by mouth 2 (two) times daily.     prochlorperazine 10 MG tablet  Commonly known as:  COMPAZINE  Take 1 tablet (10 mg total) by mouth every 6 (six) hours as needed (Nausea or vomiting).     spironolactone 25 MG tablet  Commonly known as:  ALDACTONE  Take 1 tablet (25 mg total) by mouth daily.     warfarin 3 MG tablet  Commonly known as:  COUMADIN  Take 1-2 tablets (3-6 mg total) by mouth daily. as directed by coumadin clinic         PHYSICAL EXAMINATION  Oncology Vitals 07/29/2014 07/29/2014 07/29/2014 07/29/2014 07/29/2014 07/29/2014 07/29/2014  Height - - - - - - -  Weight - - - - - - -  Weight (lbs) - - - - - - -  BMI (kg/m2) - - - - - - -  Temp - - - - 98.6 - -  Pulse 110 108 107 104 104 101 101  Resp 32 33 '22 30 27 27 26  ' SpO2 97 98 100 98 98 99 98  BSA (m2) - - - - - - -   BP Readings from Last 3 Encounters:  07/29/14 52/32  07/28/14 130/55  07/24/14 137/81    Physical Exam  Constitutional: She is oriented to person, place, and time. She appears dehydrated. She appears unhealthy. She appears toxic. She has a sickly appearance.    HENT:  Head: Normocephalic and atraumatic.  Mouth/Throat: Oropharynx is clear and moist.  Eyes: Conjunctivae and EOM are normal. Pupils are equal, round, and reactive to light. Right eye exhibits no discharge. Left eye exhibits no discharge. No scleral icterus.  Neck: Normal range of motion. Neck supple. No JVD present. No tracheal deviation present. No thyromegaly present.  Cardiovascular: Normal rate, regular rhythm, normal heart sounds and intact distal pulses.   Pulmonary/Chest: Effort normal and breath sounds normal. No respiratory distress. She has no wheezes. She has no rales. She exhibits no tenderness.  Abdominal: Soft. Bowel sounds are normal. She exhibits no distension and no mass. There is no tenderness. There is no rebound and no guarding.  Musculoskeletal: Normal range of motion. She exhibits no edema or tenderness.  Lymphadenopathy:    She has no cervical adenopathy.  Neurological: She is alert and oriented to person, place, and time.  Skin: Skin is warm and dry. No rash noted. No erythema. There is pallor.  Psychiatric: Affect normal.  Nursing note and vitals reviewed.   LABORATORY DATA:. Admission on 07/28/2014  Component Date Value Ref Range Status  . Specimen Description 07/28/2014 BLOOD LEFT FOREARM   Final  . Special Requests 07/28/2014 BOTTLES DRAWN AEROBIC ONLY 4CC   Final  . Culture 07/28/2014    Final                   Value:GRAM NEGATIVE RODS Note: Gram Stain Report Called to,Read Back By and Verified With: AMY ARNOLD RN '@4PM'  VINCJ 07/29/14 Performed at [facility]   . Report Status 07/28/2014 PENDING   Incomplete  . Lactic Acid, Venous 07/28/2014 5.8* 0.5 - 2.0 mmol/L Final   Comment: CRITICAL RESULT CALLED TO, READ BACK BY  AND VERIFIED WITH: Martina Sinner RN 0130 07/29/14 A NAVARRO   . Color, Urine 07/29/2014 AMBER* YELLOW Final   BIOCHEMICALS MAY BE AFFECTED BY COLOR  . APPearance 07/29/2014 CLOUDY* CLEAR Final  . Specific Gravity, Urine  07/29/2014 1.020  1.005 - 1.030 Final  . pH 07/29/2014 5.0  5.0 - 8.0 Final  . Glucose, UA 07/29/2014 100* NEGATIVE mg/dL Final  . Hgb urine dipstick 07/29/2014 NEGATIVE  NEGATIVE Final  . Bilirubin Urine 07/29/2014 NEGATIVE  NEGATIVE Final  . Ketones, ur 07/29/2014 NEGATIVE  NEGATIVE mg/dL Final  . Protein, ur 07/29/2014 NEGATIVE  NEGATIVE mg/dL Final  . Urobilinogen, UA 07/29/2014 0.2  0.0 - 1.0 mg/dL Final  . Nitrite 07/29/2014 NEGATIVE  NEGATIVE Final  . Leukocytes, UA 07/29/2014 NEGATIVE  NEGATIVE Final   MICROSCOPIC NOT DONE ON URINES WITH NEGATIVE PROTEIN, BLOOD, LEUKOCYTES, NITRITE, OR GLUCOSE <1000 mg/dL.  . TSH 07/28/2014 0.514  0.350 - 4.500 uIU/mL Final  . Prothrombin Time 07/28/2014 54.1* 11.6 - 15.2 seconds Final   REPEATED TO VERIFY  . INR 07/28/2014 6.02* 0.00 - 1.49 Final   Comment: REPEATED TO VERIFY CRITICAL RESULT CALLED TO, READ BACK BY AND VERIFIED WITH: SCOTTON,J/4W '@0046'  ON 07/29/14 BY KARCZEWSKI,S.   . Prothrombin Time 07/29/2014 29.7* 11.6 - 15.2 seconds Final  . INR 07/29/2014 2.80* 0.00 - 1.49 Final  . Glucose-Capillary 07/29/2014 166* 70 - 99 mg/dL Final  . Troponin I 07/29/2014 <0.03  <0.031 ng/mL Final   Comment:        NO INDICATION OF MYOCARDIAL INJURY.   . Influenza A By PCR 07/29/2014 NEGATIVE  NEGATIVE Final  . Influenza B By PCR 07/29/2014 NEGATIVE  NEGATIVE Final  . H1N1 flu by pcr 07/29/2014 NOT DETECTED  NOT DETECTED Final   Comment:        The Xpert Flu assay (FDA approved for nasal aspirates or washes and nasopharyngeal swab specimens), is intended as an aid in the diagnosis of influenza and should not be used as a sole basis for treatment. Performed at [facility]   . Lactic Acid, Venous 07/29/2014 7.7* 0.5 - 2.0 mmol/L Final   Comment: RESULT REPEATED AND VERIFIED CRITICAL RESULT CALLED TO, READ BACK BY AND VERIFIED WITH: A. ARNOLD AT 0735 ON 07/29/14 BY HOBBINS, J.   . Unit Number 07/29/2014 [id number]   Final  .  Blood Component Type 07/29/2014 THAWED PLASMA   Final  . Unit division 07/29/2014 00   Final  . Status of Unit 07/29/2014 ISSUED   Final  . Transfusion Status 07/29/2014 OK TO TRANSFUSE   Final  . Unit Number 07/29/2014 [id number]   Final  . Blood Component Type 07/29/2014 THAWED PLASMA   Final  . Unit division 07/29/2014 00   Final  . Status of Unit 07/29/2014 ISSUED   Final  . Transfusion Status 07/29/2014 OK TO TRANSFUSE   Final  . O2 Content 07/29/2014 2.0   Final  . Delivery systems 07/29/2014 NASAL CANNULA   Final  . pH, Arterial 07/29/2014 7.348* 7.350 - 7.450 Final  . pCO2 arterial 07/29/2014 21.6* 35.0 - 45.0 mmHg Final  . pO2, Arterial 07/29/2014 81.0  80.0 - 100.0 mmHg Final  . Bicarbonate 07/29/2014 11.6* 20.0 - 24.0 mEq/L Final  . TCO2 07/29/2014 10.6  0 - 100 mmol/L Final  . Acid-base deficit 07/29/2014 12.3* 0.0 - 2.0 mmol/L Final  . O2 Saturation 07/29/2014 95.6   Final  . Patient temperature 07/29/2014 98.6   Final  . Collection site 07/29/2014  A-LINE   Final  . Drawn by 07/29/2014 [id number]   Final  . Sample type 07/29/2014 ARTERIAL DRAW   Final  . Chauncey Reading test (pass/fail) 07/29/2014 PASS  PASS Final  . Sodium 07/29/2014 131* 135 - 145 mmol/L Final  . Potassium 07/29/2014 3.7  3.5 - 5.1 mmol/L Final  . Chloride 07/29/2014 109  96 - 112 mmol/L Final  . CO2 07/29/2014 11* 19 - 32 mmol/L Final  . Glucose, Bld 07/29/2014 163* 70 - 99 mg/dL Final  . BUN 07/29/2014 25* 6 - 23 mg/dL Final  . Creatinine, Ser 07/29/2014 1.14* 0.50 - 1.10 mg/dL Final  . Calcium 07/29/2014 6.5* 8.4 - 10.5 mg/dL Final  . Total Protein 07/29/2014 4.4* 6.0 - 8.3 g/dL Final  . Albumin 07/29/2014 2.1* 3.5 - 5.2 g/dL Final  . AST 07/29/2014 36  0 - 37 U/L Final  . ALT 07/29/2014 36* 0 - 35 U/L Final  . Alkaline Phosphatase 07/29/2014 67  39 - 117 U/L Final  . Total Bilirubin 07/29/2014 1.3* 0.3 - 1.2 mg/dL Final  . GFR calc non Af Amer 07/29/2014 51* >90 mL/min Final  . GFR calc Af Amer  07/29/2014 59* >90 mL/min Final   Comment: (NOTE) The eGFR has been calculated using the CKD EPI equation. This calculation has not been validated in all clinical situations. eGFR's persistently <90 mL/min signify possible Chronic Kidney Disease.   . Anion gap 07/29/2014 11  5 - 15 Final  . WBC 07/29/2014 0.2* 4.0 - 10.5 K/uL Final   Comment: RESULT REPEATED AND VERIFIED CRITICAL RESULT CALLED TO, READ BACK BY AND VERIFIED WITH: ARNOLD,A AT 1030 ON [id number] BY HOOKER,B   . RBC 07/29/2014 4.11  3.87 - 5.11 MIL/uL Final  . Hemoglobin 07/29/2014 12.7  12.0 - 15.0 g/dL Final  . HCT 07/29/2014 36.5  36.0 - 46.0 % Final  . MCV 07/29/2014 88.8  78.0 - 100.0 fL Final  . MCH 07/29/2014 30.9  26.0 - 34.0 pg Final  . MCHC 07/29/2014 34.8  30.0 - 36.0 g/dL Final  . RDW 07/29/2014 13.7  11.5 - 15.5 % Final  . Platelets 07/29/2014 74* 150 - 400 K/uL Final   Comment: RESULT REPEATED AND VERIFIED SPECIMEN CHECKED FOR CLOTS   . Neutrophils Relative % 07/29/2014 23* 43 - 77 % Final  . Lymphocytes Relative 07/29/2014 14  12 - 46 % Final  . Monocytes Relative 07/29/2014 59* 3 - 12 % Final  . Eosinophils Relative 07/29/2014 0  0 - 5 % Final  . Basophils Relative 07/29/2014 4* 0 - 1 % Final  . Neutro Abs 07/29/2014 0.0* 1.7 - 7.7 K/uL Final  . Lymphs Abs 07/29/2014 0.0* 0.7 - 4.0 K/uL Final  . Monocytes Absolute 07/29/2014 0.2  0.1 - 1.0 K/uL Final  . Eosinophils Absolute 07/29/2014 0.0  0.0 - 0.7 K/uL Final  . Basophils Absolute 07/29/2014 0.0  0.0 - 0.1 K/uL Final  . RBC Morphology 07/29/2014 RARE NRBCs   Final  . WBC Morphology 07/29/2014 TOXIC GRANULATION   Final  . Smear Review 07/29/2014 PLATELET CLUMPS NOTED ON SMEAR   Final  . Lactic Acid, Venous 07/29/2014 6.0* 0.5 - 2.0 mmol/L Final   Comment: CRITICAL RESULT CALLED TO, READ BACK BY AND VERIFIED WITH: ALDRIDGE,D AT 2:35PM ON 07/29/14 BY FESTERMAN,C   . Glucose-Capillary 07/29/2014 137* 70 - 99 mg/dL Final  . ABO/RH(D) 07/29/2014 O POS    Final  . Antibody Screen 07/29/2014 NEG   Final  . Sample Expiration  07/29/2014 08/01/2014   Final  . Total hemoglobin 07/29/2014 12.9  12.0 - 16.0 g/dL Final  . O2 Saturation 07/29/2014 63.8   Final  . Carboxyhemoglobin 07/29/2014 1.2  0.5 - 1.5 % Final  . Methemoglobin 07/29/2014 0.7  0.0 - 1.5 % Final  . MRSA by PCR 07/29/2014 NEGATIVE  NEGATIVE Final   Comment:        The GeneXpert MRSA Assay (FDA approved for NASAL specimens only), is one component of a comprehensive MRSA colonization surveillance program. It is not intended to diagnose MRSA infection nor to guide or monitor treatment for MRSA infections.   . Glucose-Capillary 07/29/2014 229* 70 - 99 mg/dL Final  . Sodium 07/29/2014 132* 135 - 145 mmol/L Final  . Potassium 07/29/2014 3.4* 3.5 - 5.1 mmol/L Final  . Chloride 07/29/2014 108  96 - 112 mmol/L Final  . CO2 07/29/2014 13* 19 - 32 mmol/L Final  . Glucose, Bld 07/29/2014 213* 70 - 99 mg/dL Final  . BUN 07/29/2014 21  6 - 23 mg/dL Final  . Creatinine, Ser 07/29/2014 0.99  0.50 - 1.10 mg/dL Final  . Calcium 07/29/2014 6.6* 8.4 - 10.5 mg/dL Final  . GFR calc non Af Amer 07/29/2014 61* >90 mL/min Final  . GFR calc Af Amer 07/29/2014 70* >90 mL/min Final   Comment: (NOTE) The eGFR has been calculated using the CKD EPI equation. This calculation has not been validated in all clinical situations. eGFR's persistently <90 mL/min signify possible Chronic Kidney Disease.   . Anion gap 07/29/2014 11  5 - 15 Final  . Glucose-Capillary 07/29/2014 139* 70 - 99 mg/dL Final  Appointment on 07/28/2014  Component Date Value Ref Range Status  . WBC 07/28/2014 < 0.2* 3.9 - 10.3 10e3/uL Final  . HGB 07/28/2014 13.5  11.6 - 15.9 g/dL Final  . HCT 07/28/2014 38.6  34.8 - 46.6 % Final  . Platelets 07/28/2014 91* 145 - 400 10e3/uL Final  . MCV 07/28/2014 88.3  79.5 - 101.0 fL Final  . MCH 07/28/2014 30.9  25.1 - 34.0 pg Final  . MCHC 07/28/2014 35.0  31.5 - 36.0 g/dL Final  . RBC  07/28/2014 4.37  3.70 - 5.45 10e6/uL Final  . RDW 07/28/2014 13.3  11.2 - 14.5 % Final  . Sodium 07/28/2014 132* 136 - 145 mEq/L Final  . Potassium 07/28/2014 3.6  3.5 - 5.1 mEq/L Final  . Chloride 07/28/2014 100  98 - 109 mEq/L Final  . CO2 07/28/2014 18* 22 - 29 mEq/L Final  . Glucose 07/28/2014 296* 70 - 140 mg/dl Final  . BUN 07/28/2014 13.1  7.0 - 26.0 mg/dL Final  . Creatinine 07/28/2014 0.7  0.6 - 1.1 mg/dL Final  . Total Bilirubin 07/28/2014 1.72* 0.20 - 1.20 mg/dL Final  . Alkaline Phosphatase 07/28/2014 102  40 - 150 U/L Final  . AST 07/28/2014 21  5 - 34 U/L Final  . ALT 07/28/2014 42  0 - 55 U/L Final  . Total Protein 07/28/2014 5.3* 6.4 - 8.3 g/dL Final  . Albumin 07/28/2014 2.7* 3.5 - 5.0 g/dL Final  . Calcium 07/28/2014 7.8* 8.4 - 10.4 mg/dL Final  . Anion Gap 07/28/2014 13* 3 - 11 mEq/L Final  . EGFR 07/28/2014 >90  >90 ml/min/1.73 m2 Final   eGFR is calculated using the CKD-EPI Creatinine Equation (2009)  . Technologist Review 07/28/2014 Occasional neutrophil and lymph seen on scan    Final     RADIOGRAPHIC STUDIES: US Abdomen Complete  07/29/2014  CLINICAL DATA:  Initial evaluation elevated liver function enzymes, personal history of cholecystectomy, breast cancer, ovarian cancer, currently on chemotherapy  EXAM: ULTRASOUND ABDOMEN COMPLETE  COMPARISON:  None.  FINDINGS: Gallbladder: Surgically absent  Common bile duct: Diameter: 5 mm  Liver: Heterogeneous echotexture. No focal abnormalities. Trace fluid identified around the liver.  IVC: No abnormality visualized.  Pancreas: Not identified  Spleen: Normal with a span of 10 cm  Right Kidney: Length: 12.8 cm. Echogenicity within normal limits. No mass or hydronephrosis visualized.  Left Kidney: Length: 11.1 cm. Echogenicity within normal limits. No mass or hydronephrosis visualized.  Abdominal aorta: Not well seen.  Other findings: None.  IMPRESSION: Hit coarsened heterogeneous hepatic echotexture suggesting a hepatic  parenchymal disease, used which can include steatosis. Left trace fluid seen around the liver.   Electronically Signed   By: Skipper Cliche M.D.   On: 07/29/2014 11:37   Dg Chest Port 1 View  07/29/2014   CLINICAL DATA:  Acute sepsis, heart failure, history of breast cancer  EXAM: PORTABLE CHEST - 1 VIEW  COMPARISON:  07/28/2014  FINDINGS: Left IJ port catheter tip SVC RA junction. New right IJ central line tip lower SVC level. Heart is enlarged with slight increased vascular and interstitial changes concerning for developing edema. No focal pneumonia, collapse or consolidation. No effusion or pneumothorax. Trachea midline. Postop changes from the right breast surgery and axillary lymph node dissection.  IMPRESSION: Right IJ central line tip lower SVC level.  Mild developing edema pattern.   Electronically Signed   By: Jerilynn Mages.  Shick M.D.   On: 07/29/2014 09:54   Dg Chest Port 1 View  07/29/2014   CLINICAL DATA:  Fever and diarrhea for 2 days.  EXAM: PORTABLE CHEST - 1 VIEW  COMPARISON:  04/26/2014  FINDINGS: There is a left-sided Port-A-Cath with tip in the cavoatrial junction. There is generalized interstitial fluid or thickening which is increased from 04/26/2014. There is no airspace opacity. There is no large effusion.  IMPRESSION: Interstitial fluid or infiltrate, new from 04/26/2014.   Electronically Signed   By: Andreas Newport M.D.   On: 07/29/2014 00:32    ASSESSMENT/PLAN:    Anorexia Patient complaining of minimal appetite and poor oral intake initiating her chemotherapy regimen.  Patient was encouraged to eat multiple small meals throughout the day and to push protein in her diet is much as possible.   Breast cancer, right breast Patient is status post right breast mastectomy.  Currently undergoing letrozole oral therapy.     Clear cell carcinoma of ovary, unspecified laterality Patient is status post hysterectomy on 06/27/2014.  She received cycle 2 of her carboplatin/Taxol  chemotherapy on 07/24/2014.  She is scheduled for cycle 3 of the same regimen on 08/07/2014.   Diarrhea Patient complaining off and having several episodes of diarrhea within the past 24 hours; despite taking Imodium.  She feels fairly dehydrated today. Diarrhea could very likely be secondary to recent chemotherapy; or could be viral.  Patient was given Lomotil while in the infusion area today; the patient's diarrhea did become much worse.  Patient was having almost continuous liquid stool while in the infusion area.  Patient was admitted to the hospital; for further evaluation and management of diarrhea, nausea/vomiting, dehydration, and neutropenic fever.   Hyperbilirubinemia  Bilirubin has increased to 1.72.patient is also complaining of mild generalized abdominal discomfort as well.  Patient to be admitted this evening for further evaluation and management.   Hypoalbuminemia Albumin has decreased to 2.7.  Patient was encouraged to push protein in her diet is much as possible.   Long term current use of anticoagulant therapy Hx of previous DVT in past; and currently taking coumadin.    Nausea with vomiting C/o chronic nausea; but only 1 episode of vomiting.  Pt appears dehydrated today; and has received approx 15 mls IV fluids while in infusion center today.   Will admit pt for further eval and management.    Neutropenic fever Pt states she awoke this morning with temp of 100.2; and had both N/V and diarrhea.  Temp checked while in infusion area was initially 98.5; but then increased to 100.5. Labs drawn and pt neutropenic with WBC less 0.2.    Due to pt's dx of neutropenic fever, worsening diarrhea, dehydration, and overall decline throughout this afternoon- will direct admit pt to Telemetry floor per hospitalist Dr. Carles Collet. Brief hx and report was called to Dr. Carles Collet; and to [facility] floor RN prior to pt being transported to [facility] floor bed per [facility] nurse.     Thrombocytopenia Chemo-induced thrombocytopenia noted; with plt count down to 91.  Pt denies any worsening issues with easy bleeding or bruising.    Weakness Patient complaining of progressive weakness since initiating her chemotherapy regimen.  Patient with chronic nausea, vomiting x1, and progressive diarrhea.  Patient appears dehydrated today.  Patient appeared progressively weak throughout the afternoon; despite receiving IV fluid rehydration.  Patient will be admitted to the telemetry floor further evaluation and management.   Was unable to obtain urine sample prior to transfer to telemetry floor for direct admission. Pt tried multiple times to give urine sample; but each time urine contaminated with stool. Both hospitalist and Tele RN aware.   Patient stated understanding of all instructions; and was in agreement with this plan of care. The patient knows to call the clinic with any problems, questions or concerns.   Review/collaboration with Dr. Jana Hakim regarding all aspects of patient's visit today.   Total time spent with patient was 40 minutes;  with greater than 75 percent of that time spent in face to face counseling regarding patient's symptoms,  and coordination of care and follow up.  Disclaimer: This note was dictated with voice recognition software. Similar sounding words can inadvertently be transcribed and may not be corrected upon review.   Drue Second, NP 07/29/2014

## 2014-07-29 NOTE — Assessment & Plan Note (Signed)
Pt states she awoke this morning with temp of 100.2; and had both N/V and diarrhea.  Temp checked while in infusion area was initially 98.5; but then increased to 100.5. Labs drawn and pt neutropenic with WBC less 0.2.    Due to pt's dx of neutropenic fever, worsening diarrhea, dehydration, and overall decline throughout this afternoon- will direct admit pt to Telemetry floor per hospitalist Dr. Carles Collet. Brief hx and report was called to Dr. Carles Collet; and to [facility] floor RN prior to pt being transported to [facility] floor bed per [facility] nurse.

## 2014-07-29 NOTE — Assessment & Plan Note (Signed)
Bilirubin has increased to 1.72.patient is also complaining of mild generalized abdominal discomfort as well.  Patient to be admitted this evening for further evaluation and management.

## 2014-07-29 NOTE — Assessment & Plan Note (Signed)
Patient is status post hysterectomy on 06/27/2014.  She received cycle 2 of her carboplatin/Taxol chemotherapy on 07/24/2014.  She is scheduled for cycle 3 of the same regimen on 08/07/2014.

## 2014-07-29 NOTE — Progress Notes (Signed)
RN gave report to the nurse in stepdown receiving the patient. Patient was transferred in the bed to [facility].

## 2014-07-29 NOTE — Procedures (Signed)
Central Venous Catheter Insertion Procedure Note [name] [id number] June 04, 1955  Procedure: Insertion of Central Venous Catheter Indications: Assessment of intravascular volume, Drug and/or fluid administration and Frequent blood sampling  Procedure Details Consent: Risks of procedure as well as the alternatives and risks of each were explained to the (patient/caregiver).  Consent for procedure obtained. Time Out: Verified patient identification, verified procedure, site/side was marked, verified correct patient position, special equipment/implants available, medications/allergies/relevent history reviewed, required imaging and test results available.  Performed  Maximum sterile technique was used including antiseptics, cap, gloves, gown, hand hygiene, mask and sheet. Skin prep: Chlorhexidine; local anesthetic administered A antimicrobial bonded/coated triple lumen catheter was placed in the right internal jugular vein using the Seldinger technique.  Evaluation Blood flow good Complications: No apparent complications Patient did tolerate procedure well. Chest X-ray ordered to verify placement.  CXR: pending.  [name] 07/29/2014, 9:00 AM  [name]  [name]. Titus Mould, MD, [place] Pgr: [facility]

## 2014-07-29 NOTE — Assessment & Plan Note (Signed)
Patient complaining of progressive weakness since initiating her chemotherapy regimen.  Patient with chronic nausea, vomiting x1, and progressive diarrhea.  Patient appears dehydrated today.  Patient appeared progressively weak throughout the afternoon; despite receiving IV fluid rehydration.  Patient will be admitted to the telemetry floor further evaluation and management.

## 2014-07-29 NOTE — Progress Notes (Signed)
eLink Physician-Brief Progress Note Patient Name: AUTYM SIESS DOB: 1955/12/25 MRN: [id number]   Date of Service  07/29/2014  HPI/Events of Note  K+ = 3.4 and Creatinine = 0.99.  eICU Interventions  Replete potassium.     Intervention Category Intermediate Interventions: Electrolyte abnormality - evaluation and management  Lamontae Ricardo Eugene 07/29/2014, 6:28 PM

## 2014-07-29 NOTE — Progress Notes (Signed)
ANTICOAGULATION CONSULT NOTE - Initial Consult  Pharmacy Consult for warfarin Indication: atrial fibrillation  Allergies  Allergen Reactions  . Olmesartan Medoxomil Cough    REACTION: ? if cough  . Tetracycline Hcl     Unknown reaction, too long for patient to remember   . Venlafaxine     REACTION: severe dry moouth  . Adhesive [Tape] Rash    Patient Measurements: Height: 5\' 5"  (165.1 cm) Weight: 170 lb (77.111 kg) IBW/kg (Calculated) : 57 Heparin Dosing Weight:   Vital Signs: Temp: 99.8 F (37.7 C) (03/19 0129) Temp Source: Oral (03/19 0129) BP: 110/49 mmHg (03/19 0200) Pulse Rate: 110 (03/19 0200)  Labs:  Recent Labs  07/28/14 1443 07/28/14 1443 07/28/14 2355  HGB 13.5  --   --   HCT 38.6  --   --   PLT 91*  --   --   LABPROT  --   --  54.1*  INR  --   --  6.02*  CREATININE  --  0.7  --     Estimated Creatinine Clearance: 76.7 mL/min (by C-G formula based on Cr of 0.7).   Medical History: Past Medical History  Diagnosis Date  . Depression   . DVT (deep venous thrombosis)     hx of on HRT left leg ~2006  . GERD (gastroesophageal reflux disease)   . Hyperlipidemia   . Hypertension   . Hypothyroidism   . PPD positive, treated     rx inh   . Diabetes mellitus   . 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 DJD (degenerative joint disease) of lumbar spine   . Chronic diastolic CHF (congestive heart failure)     a. 08/2012 Echo: EF 55-60%, no rwma, mod MR.  [name] A-fib     a. on amio for rate control/coumadin.  [name] Physical deconditioning   . Allergy   . Anemia     low iron hx  . Blood transfusion without reported diagnosis     2 u prbc  . Cataract     removed ou  . OSA on CPAP     cpap 4.5 setting  . Breast cancer 2014    a. 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 ALPRAZolam (XANAX XR) 1 MG 24 hr tablet Take 1 tablet (1 mg total) by mouth 2 (two) times daily. 60 tablet 1 07/28/2014 at Unknown time  . buPROPion (WELLBUTRIN) 75 MG tablet Take 1 tablet (75 mg total) by mouth 2 (two) times daily. 14 tablet 0 07/28/2014 at Unknown time  . clonazePAM (KLONOPIN) 0.5 MG tablet TAKE 1/2 TABLET TWICE DAILY 30 tablet 0 07/28/2014 at Unknown time  . dexamethasone (DECADRON) 4 MG tablet Take 2 tablets (8 mg total) by mouth 2 (two) times daily with a meal. Start the day after chemotherapy for 3 days. 30 tablet 1 07/28/2014 at 1030  . escitalopram (LEXAPRO) 20 MG tablet Take 1 tablet (20 mg total) by mouth daily. 90 tablet 1 07/27/2014 at Unknown time  . furosemide (LASIX) 20 MG tablet Take 1 tablet (20 mg total) by mouth daily. 90 tablet 1 07/28/2014 at Unknown time  . letrozole (FEMARA) 2.5 MG tablet Take 1 tablet (2.5 mg total) by mouth daily. 90 tablet 0 07/28/2014 at 1000  .  levothyroxine (SYNTHROID, LEVOTHROID) 150 MCG tablet Take 1 tablet (150 mcg total) by mouth daily. 90 tablet 1 07/28/2014 at Unknown time  . lidocaine-prilocaine (EMLA) cream Apply over port area 1-2 hours before chemotherapy 30 g 0 07/28/2014 at Unknown time  . LORazepam (ATIVAN) 0.5 MG tablet Take 1 tablet (0.5 mg total) by mouth at bedtime as needed for anxiety. 30 tablet 1 07/27/2014 at Unknown time  . metFORMIN (GLUCOPHAGE) 500 MG tablet Take 1 tablet (500 mg total) by mouth 2 (two) times daily with a meal. 180 tablet 1 07/28/2014 at Unknown time  . metoprolol tartrate (LOPRESSOR) 25 MG tablet Take 1 tablet (25 mg total) by mouth 2 (two) times daily. 180 tablet 1 07/28/2014 at 1000  . omeprazole (PRILOSEC) 20 MG capsule 1 tab po bid (Patient taking differently: Take 20 mg by mouth daily as needed (FOR REFLUX). ) 60 capsule 1 07/28/2014 at Unknown time  . ondansetron (ZOFRAN) 8 MG tablet Take 1 tablet (8 mg total) by mouth 2 (two) times daily. Start the day after  chemo for 3 days. Then take as needed for nausea or vomiting. 30 tablet 1 07/28/2014 at Unknown time  . potassium chloride (K-DUR) 10 MEQ tablet Take 10 mEq by mouth 2 (two) times daily.  3 07/28/2014 at Unknown time  . spironolactone (ALDACTONE) 25 MG tablet Take 1 tablet (25 mg total) by mouth daily. 90 tablet 1 07/28/2014 at Unknown time  . warfarin (COUMADIN) 3 MG tablet Take 1-2 tablets (3-6 mg total) by mouth daily. as directed by coumadin clinic (Patient taking differently: Take 3-6 mg by mouth daily. Take 2 tablets (6 mg) by mouth on Sun, Tues, Thurs, Sat and Take 1 tablet (3 mg) by mouth on Mon, Wed, Fri.) 135 tablet 1 07/27/2014 at 2000  . enoxaparin (LOVENOX) 120 MG/0.8ML injection Inject 0.8 mLs (120 mg total) into the skin daily. (Patient not taking: Reported on 07/28/2014) 10 Syringe 1 Not Taking at Unknown time  . OLANZapine (ZYPREXA) 2.5 MG tablet TAKE 1 TABLET NIGHTLY AT BEDTIME AS NEEDED FOR SLEEP OR anxiety 90 tablet 1 unknown at unknown time  . prochlorperazine (COMPAZINE) 10 MG tablet Take 1 tablet (10 mg total) by mouth every 6 (six) hours as needed (Nausea or vomiting). 30 tablet 1 unknown at unknown time    Assessment: Patient on chronic warfarin for afib.  INR on admit is >6.   Goal of Therapy:  INR 2-3    Plan:  Daily INR No warfarin at this time.  Tyler Deis, Shea Stakes Crowford 07/29/2014,4:05 AM

## 2014-07-29 NOTE — Consult Note (Signed)
[place] NOTE  Patient Care Team: Debbrah Alar, NP as PCP - General (Internal Medicine) Lafayette Dragon, MD (Gastroenterology)  CHIEF COMPLAINTS/PURPOSE OF CONSULTATION:  Febrile Neutropenia S/P chemo for ovarian cancer  HISTORY OF PRESENTING ILLNESS:  [name] 59 y.o. female is admitted to the hospital with febrile neutropenia and severe sepsis related to gram-negative rods. She has a recent history of ovarian cancer and had undergone hysterectomy with bilateral salpingo-oophorectomy and lymph node resection in February 2016. She was started on adjuvant chemotherapy with Taxol and carboplatin on 07/17/2014. Chemotherapy was given on days 1 and 8. Day 8 was on 07/24/2014. She received Neulasta injection after day 8 of chemotherapy. With chemotherapy she had a mild reaction to the first dose of Taxol but did not have any problems with the second dose. She also had mild elevation of total bilirubin. She had an absolute neutrophil count of 1100 on 07/24/2014 which was day 8 of chemotherapy.  She has been admitted to the hospital from the cancer center with febrile neutropenia, dehydration, diarrhea. She was given 8 L of normal saline to bring her blood pressure up. She is currently on blood pressure support with norepinephrine. She was also started on broad-spectrum antibiotics with vancomycin, micafungin, metronidazole, cefepime and ciprofloxacin for gram-negative aerobic and anaerobic rods.  Patient is awake and alert and answers questions appropriately. She is extremely weak and frail and does not have much appetite. This morning she had undergone central venous line placement for CVP monitoring. Her husband is at her bedside.  I reviewed her records extensively and collaborated the history with the patient. She has a prior history of stage III breast cancer that was treated with surgery followed by adjuvant chemotherapy and oral antiestrogen therapy. She also has  chronic atrial fibrillation for which she was on Coumadin therapy. Her INR is up to 6 and she is getting fresh frozen plasma to reverse it.   MEDICAL HISTORY:  Past Medical History  Diagnosis Date  . Depression   . DVT (deep venous thrombosis)     hx of on HRT left leg ~2006  . GERD (gastroesophageal reflux disease)   . Hyperlipidemia   . Hypertension   . Hypothyroidism   . PPD positive, treated     rx inh   . Diabetes mellitus   . 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 DJD (degenerative joint disease) of lumbar spine   . Chronic diastolic CHF (congestive heart failure)     a. 08/2012 Echo: EF 55-60%, no rwma, mod MR.  [name] A-fib     a. on amio for rate control/coumadin.  [name] Physical deconditioning   . Allergy   . Anemia     low iron hx  . Blood transfusion without reported diagnosis     2 u prbc  . Cataract     removed ou  . OSA on CPAP     cpap 4.5 setting  . Breast cancer 2014    a. 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 Tubal ligation    . Cholecystectomy    . Foot surgery    . Eye surgery    . Cataract extraction    . Percutaneous liver biopsy    . Breast biopsy  left breast  . Mastectomy modified radical  06/08/2012    Procedure: MASTECTOMY MODIFIED RADICAL;  Surgeon: Edward Jolly, MD;  Location: [place];  Service: General;  Laterality: Right;  . Portacath placement  06/08/2012    Procedure: INSERTION PORT-A-CATH;  Surgeon: Edward Jolly, MD;  Location: [place];  Service: General;  Laterality: Left;  . Peg tube placement      peg removed 2015  . History of chemotherapy x 2 treatments, radiation tx  2014  . Pac removed  2015  . Laparotomy N/A 06/27/2014    Procedure: EXPLORATORY LAPAROTOMY;  Surgeon: Everitt Amber, MD;  Location: [facility];  Service:  Gynecology;  Laterality: N/A;  . Abdominal hysterectomy N/A 06/27/2014    Procedure: HYSTERECTOMY ABDOMINAL TOTAL;  Surgeon: Everitt Amber, MD;  Location: [facility];  Service: Gynecology;  Laterality: N/A;  . Salpingoophorectomy Bilateral 06/27/2014    Procedure: BILATERAL SALPINGO OOPHORECTOMY/TUMOR DEBULKING/LYMPHNODE DISSECTION, OMENTECTOMY;  Surgeon: Everitt Amber, MD;  Location: [facility];  Service: Gynecology;  Laterality: Bilateral;    SOCIAL HISTORY: History   Social History  . Marital Status: Married    Spouse Name: N/A  . Number of Children: 2  . Years of Education: N/A   Occupational History  . [place] History Main Topics  . Smoking status: Former Smoker -- 1.00 packs/day for 1 years  . Smokeless tobacco: Never Used  . Alcohol Use: No  . Drug Use: No     Comment: quit age 65 only smoked as a teen 1 year  . Sexual Activity: No   Other Topics Concern  . 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     FAMILY HISTORY: Family History  Problem Relation Age of Onset  . Diabetes Mother   . Hypertension Mother   . Arthritis Mother   . Heart disease Mother   . Heart failure Mother   . Other Mother     benign breast mass  . Stroke Father   . Heart disease Father   . Diabetes Paternal Grandmother   . Colon cancer Paternal Grandfather     ALLERGIES:  is allergic to olmesartan medoxomil; tetracycline hcl; venlafaxine; and adhesive.  MEDICATIONS:  Current Facility-Administered Medications  Medication Dose Route Frequency Provider Last Rate Last Dose  . 0.9 %  sodium chloride infusion   Intravenous Continuous Rise Patience, MD 125 mL/hr at 07/29/14 0400    . 0.9 %  sodium chloride infusion   Intravenous Continuous [name], MD      . ceFEPIme (MAXIPIME) 2 g in  dextrose 5 % 50 mL IVPB  2 g Intravenous 3 times per day Adrian Saran, [facility]   2 g at 07/29/14 9528  . ciprofloxacin (CIPRO) IVPB 400 mg  400 mg Intravenous Q12H [name], MD      . filgrastim (NEUPOGEN) 480 mcg in dextrose 5 % 25 mL IVPB  480 mcg Intravenous Daily [name], MD   480 mcg at 07/29/14 [phone]  . hydrocortisone sodium succinate (SOLU-CORTEF) 100 MG injection 50 mg  50 mg Intravenous Q6H [name], MD      . insulin aspart (novoLOG) injection 0-9 Units  0-9 Units Subcutaneous TID WC Rise Patience, MD      . insulin aspart (novoLOG) injection 0-9 Units  0-9 Units Subcutaneous 6 times per day [name], MD      . letrozole [facility]) tablet 2.5 mg  2.5 mg Oral Daily Rise Patience, MD      . levothyroxine (SYNTHROID, LEVOTHROID) tablet 150 mcg  150 mcg Oral QAC breakfast Rise Patience, MD      . metroNIDAZOLE (FLAGYL) IVPB 500 mg  500 mg Intravenous 4 times per day [name], MD      . micafungin [facility]) 100 mg in sodium chloride 0.9 % 100 mL IVPB  100 mg Intravenous Daily [name], MD   100 mg at 07/29/14 0953  . norepinephrine (LEVOPHED) 4 mg in dextrose 5 % 250 mL (0.016 mg/mL) infusion  0-40 mcg/min Intravenous Titrated Padmaja Vasireddy, MD 22.5 mL/hr at 07/29/14 0819 6 mcg/min at 07/29/14 0819  . norepinephrine (LEVOPHED) 4 mg in dextrose 5 % 250 mL (0.016 mg/mL) infusion  5-50 mcg/min Intravenous Titrated [name], MD      . ondansetron [facility]) tablet 4 mg  4 mg Oral Q6H PRN Rise Patience, MD       Or  . ondansetron [facility]) injection 4 mg  4 mg Intravenous Q6H PRN Rise Patience, MD      . pantoprazole (PROTONIX) EC tablet 40 mg  40 mg Oral Daily Rise Patience, MD      . phytonadione (VITAMIN K) 2 mg in dextrose 5 % 50 mL IVPB  2 mg Intravenous Once [name], MD      . potassium chloride (K-DUR) CR tablet 10 mEq  10 mEq Oral BID Rise Patience, MD   10 mEq at 07/28/14  2332  . sodium chloride 0.9 % injection 10-40 mL  10-40 mL Intracatheter Q12H Rise Patience, MD   10 mL at 07/29/14 0300  . sodium chloride 0.9 % injection 10-40 mL  10-40 mL Intracatheter PRN Rise Patience, MD      . vancomycin (VANCOCIN) IVPB 1000 mg/200 mL premix  1,000 mg Intravenous Q12H Adrian Saran, RPH   1,000 mg at 07/28/14 2259   Facility-Administered Medications Ordered in Other Encounters  Medication Dose Route Frequency Provider Last Rate Last Dose  . sodium chloride 0.9 % injection 10 mL  10 mL Intracatheter PRN Chauncey Cruel, MD   10 mL at 07/24/14 1523    REVIEW OF SYSTEMS:   Constitutional: Fevers and chills Respiratory: Denies any cough, shortness of breath Cardiovascular: Does have palpitations Gastrointestinal:  Diarrhea Skin: Denies abnormal skin rashes Neurological: Generalized weakness All other systems were reviewed with the patient and are negative.  PHYSICAL EXAMINATION: ECOG PERFORMANCE STATUS: 3 - Symptomatic, >50% confined to bed  Filed Vitals:   07/29/14 1000  BP:   Pulse: 124  Temp:   Resp: 24   Filed Weights   07/29/14 0200  Weight: 170 lb (77.111 kg)    GENERAL:alert OROPHARYNX: Moist LUNGS: Bibasilar crackles HEART: Irregular tachycardia ABDOMEN:abdomen soft Musculoskeletal:no cyanosis of digits and no clubbing  NEURO: no focal motor/sensory deficits  LABORATORY DATA:  I have reviewed the data as listed Lab Results  Component Value Date   WBC < 0.2* 07/28/2014   HGB 13.5 07/28/2014   HCT 38.6 07/28/2014   MCV 88.3 07/28/2014   PLT 91* 07/28/2014   Lab Results  Component Value Date   NA 132*  07/28/2014   K 3.6 07/28/2014   CL 101 07/11/2014   CO2 18* 07/28/2014    ASSESSMENT AND PLAN: 1. Febrile neutropenia with severe sepsis: Patient had recent chemotherapy for ovarian cancer with Taxol and carboplatin. She received Neulasta injection on 07/25/2014. Her blood cultures revealed gram-negative rods both  aerobic and anaerobic. She is currently on broad-spectrum antibiotics. She also has diarrhea for which C. difficile has been pending. Currently on Flagyl as well. Pulmonary critical care is assisting her with management of the critical care issues including severe hypotension and antibiotics. Chest x-ray is pending  2. Breast cancer and ovarian cancer: Patient is currently on adjuvant chemotherapy. Dr. Jana Hakim will determine if she can tolerate any more treatments given her current sepsis and neutropenic fever complication.  I discussed with the patient and her husband that she is still very critical given her borderline blood pressure in spite of high dose of Levothroid.  We are happy to assist patient in any way we can. Thank very much for consultation    Rulon Eisenmenger, MD 10:21 AM

## 2014-07-29 NOTE — Assessment & Plan Note (Signed)
C/o chronic nausea; but only 1 episode of vomiting.  Pt appears dehydrated today; and has received approx 15 mls IV fluids while in infusion center today.   Will admit pt for further eval and management.

## 2014-07-29 NOTE — Assessment & Plan Note (Signed)
Patient complaining of minimal appetite and poor oral intake initiating her chemotherapy regimen.  Patient was encouraged to eat multiple small meals throughout the day and to push protein in her diet is much as possible.

## 2014-07-29 NOTE — Consult Note (Signed)
PULMONARY / CRITICAL CARE MEDICINE   Name: [name] MRN: [id number] DOB: 1955/08/14    ADMISSION DATE:  07/28/2014 CONSULTATION DATE:  07/29/14  REFERRING MD :  [facility]  CHIEF COMPLAINT:  shock  INITIAL PRESENTATION: 59 yr old s/p chemo ovarian ca and Breast Ca, shock  STUDIES:  3/19 [name] abdo>>>  SIGNIFICANT EVENTS: 3/19- 8 liters given, levo ordered  HISTORY OF PRESENT ILLNESS:  59 y.o. female history of ovarian cancer and breast cancer, diabetes mellitus type 2, chronic atrial fibrillation on Coumadin, CHF, hypertension was referred to the ER after patient was found to have febrile neutropenia. Patient states the last 2 days patient has been having diarrhea and some nausea and vomiting.Had fever  And neutropenia odentified. To [facility] ICU, 8 liters given for shock, RN reports crackles, Loose stool continued. Called for consult. Bili elevated and liver dz, no abdo pain.  PAST MEDICAL HISTORY :   has a past medical history of Depression; DVT (deep venous thrombosis); GERD (gastroesophageal reflux disease); Hyperlipidemia; Hypertension; Hypothyroidism; PPD positive, treated; Diabetes mellitus; Liver disease, chronic, with cirrhosis; DJD (degenerative joint disease) of lumbar spine; Chronic diastolic CHF (congestive heart failure); A-fib; Physical deconditioning; Allergy; Anemia; Blood transfusion without reported diagnosis; Cataract; OSA on CPAP; and Breast cancer (2014).  has past surgical history that includes Tubal ligation; Cholecystectomy; Foot surgery; Eye surgery; Cataract extraction; Percutaneous liver biopsy; Breast biopsy; Mastectomy modified radical (06/08/2012); Portacath placement (06/08/2012); PEG tube placement; history of chemotherapy x 2 treatments, radiation tx (2014); pac removed (2015); laparotomy (N/A, 06/27/2014); Abdominal hysterectomy (N/A, 06/27/2014); and Salpingoophorectomy (Bilateral, 06/27/2014). Prior to Admission medications   Medication Sig Start Date End Date Taking?  Authorizing Provider  ALPRAZolam (XANAX XR) 1 MG 24 hr tablet Take 1 tablet (1 mg total) by mouth 2 (two) times daily. 07/05/14  Yes Chauncey Cruel, MD  buPROPion (WELLBUTRIN) 75 MG tablet Take 1 tablet (75 mg total) by mouth 2 (two) times daily. 05/25/14  Yes Debbrah Alar, NP  clonazePAM (KLONOPIN) 0.5 MG tablet TAKE 1/2 TABLET TWICE DAILY 07/12/14  Yes Debbrah Alar, NP  dexamethasone (DECADRON) 4 MG tablet Take 2 tablets (8 mg total) by mouth 2 (two) times daily with a meal. Start the day after chemotherapy for 3 days. 07/14/14  Yes Chauncey Cruel, MD  escitalopram (LEXAPRO) 20 MG tablet Take 1 tablet (20 mg total) by mouth daily. 05/23/14  Yes Debbrah Alar, NP  furosemide (LASIX) 20 MG tablet Take 1 tablet (20 mg total) by mouth daily. 05/23/14  Yes Debbrah Alar, NP  letrozole (FEMARA) 2.5 MG tablet Take 1 tablet (2.5 mg total) by mouth daily. 05/22/14  Yes Chauncey Cruel, MD  levothyroxine (SYNTHROID, LEVOTHROID) 150 MCG tablet Take 1 tablet (150 mcg total) by mouth daily. 05/23/14  Yes Debbrah Alar, NP  lidocaine-prilocaine (EMLA) cream Apply over port area 1-2 hours before chemotherapy 07/14/14  Yes Chauncey Cruel, MD  LORazepam (ATIVAN) 0.5 MG tablet Take 1 tablet (0.5 mg total) by mouth at bedtime as needed for anxiety. 07/14/14  Yes Chauncey Cruel, MD  metFORMIN (GLUCOPHAGE) 500 MG tablet Take 1 tablet (500 mg total) by mouth 2 (two) times daily with a meal. 05/23/14  Yes Debbrah Alar, NP  metoprolol tartrate (LOPRESSOR) 25 MG tablet Take 1 tablet (25 mg total) by mouth 2 (two) times daily. 05/23/14  Yes Debbrah Alar, NP  omeprazole (PRILOSEC) 20 MG capsule 1 tab po bid Patient taking differently: Take 20 mg by mouth daily as needed (FOR REFLUX).  04/26/14  Yes Meriam Sprague Saguier, PA-C  ondansetron (ZOFRAN) 8 MG tablet Take 1 tablet (8 mg total) by mouth 2 (two) times daily. Start the day after chemo for 3 days. Then take as needed for nausea or  vomiting. 07/14/14  Yes Chauncey Cruel, MD  potassium chloride (K-DUR) 10 MEQ tablet Take 10 mEq by mouth 2 (two) times daily. 04/18/14  Yes Historical Provider, MD  spironolactone (ALDACTONE) 25 MG tablet Take 1 tablet (25 mg total) by mouth daily. 05/23/14  Yes Debbrah Alar, NP  warfarin (COUMADIN) 3 MG tablet Take 1-2 tablets (3-6 mg total) by mouth daily. as directed by coumadin clinic Patient taking differently: Take 3-6 mg by mouth daily. Take 2 tablets (6 mg) by mouth on Sun, Tues, Thurs, Sat and Take 1 tablet (3 mg) by mouth on Mon, Wed, Fri. 05/22/14  Yes Thayer Headings, MD  enoxaparin (LOVENOX) 120 MG/0.8ML injection Inject 0.8 mLs (120 mg total) into the skin daily. Patient not taking: Reported on 07/28/2014 07/12/14   Chauncey Cruel, MD  OLANZapine (ZYPREXA) 2.5 MG tablet TAKE 1 TABLET NIGHTLY AT BEDTIME AS NEEDED FOR SLEEP OR anxiety 05/23/14   Debbrah Alar, NP  prochlorperazine (COMPAZINE) 10 MG tablet Take 1 tablet (10 mg total) by mouth every 6 (six) hours as needed (Nausea or vomiting). 07/14/14   Chauncey Cruel, MD   Allergies  Allergen Reactions  . Olmesartan Medoxomil Cough    REACTION: ? if cough  . Tetracycline Hcl     Unknown reaction, too long for patient to remember   . Venlafaxine     REACTION: severe dry moouth  . Adhesive [Tape] Rash    FAMILY HISTORY:  indicated that her mother is deceased. She indicated that her father is deceased.  SOCIAL HISTORY:  reports that she has quit smoking. She has never used smokeless tobacco. She reports that she does not drink alcohol or use illicit drugs.  REVIEW OF SYSTEMS: unable as lethargic  SUBJECTIVE: shock, lethargic, no abdo pain  VITAL SIGNS: Temp:  [98.2 F (36.8 C)-104.6 F (40.3 C)] 104.6 F (40.3 C) (03/19 0400) Pulse Rate:  [93-128] 105 (03/19 0800) Resp:  [17-36] 25 (03/19 0800) BP: (52-153)/(28-139) 52/32 mmHg (03/19 0700) SpO2:  [91 %-99 %] 92 % (03/19 0800) Arterial Line BP: (50)/(29)  50/29 mmHg (03/19 0800) Weight:  [77.111 kg (170 lb)] 77.111 kg (170 lb) (03/19 0200) HEMODYNAMICS:   VENTILATOR SETTINGS:   INTAKE / OUTPUT:  Intake/Output Summary (Last 24 hours) at 07/29/14 0814 Last data filed at 07/29/14 0700  Gross per 24 hour  Intake 4242.5 ml  Output    250 ml  Net 3992.5 ml    PHYSICAL EXAMINATION: General:  Lethargic but arousable Neuro:  Moves upper ext equal , perrl, answers questions HEENT:  No jvd obese neck, no stridor Cardiovascular:  s1 s2 RRT no m Lungs:  Coarse, port clean, no DC Abdomen:  Soft, scar healed, no r/g Musculoskeletal:  No pain, no edema Skin:  No rash  LABS:  CBC  Recent Labs Lab 07/24/14 1110 07/28/14 1443  WBC 2.2* < 0.2*  HGB 14.0 13.5  HCT 43.5 38.6  PLT 119* 91*   Coag's  Recent Labs Lab 07/24/14 1110 07/24/14 1300 07/28/14 2355  INR 2.90 2.9 6.02*   BMET  Recent Labs Lab 07/24/14 1110 07/28/14 1443  NA 138 132*  K 4.0 3.6  CO2 22 18*  BUN 9.9 13.1  CREATININE 0.7 0.7  GLUCOSE 117 296*  Electrolytes  Recent Labs Lab 07/24/14 1110 07/28/14 1443  CALCIUM 9.1 7.8*   Sepsis Markers  Recent Labs Lab 07/28/14 2355 07/29/14 0626  LATICACIDVEN 5.8* 7.7*   ABG No results for input(s): PHART, PCO2ART, PO2ART in the last 168 hours. Liver Enzymes  Recent Labs Lab 07/24/14 1110 07/28/14 1443  AST 70* 21  ALT 86* 42  ALKPHOS 120 102  BILITOT 1.67* 1.72*  ALBUMIN 3.3* 2.7*   Cardiac Enzymes  Recent Labs Lab 07/29/14 0626  TROPONINI <0.03   Glucose  Recent Labs Lab 07/29/14 0207  GLUCAP 166*    Imaging Dg Chest Port 1 View  07/29/2014   CLINICAL DATA:  Fever and diarrhea for 2 days.  EXAM: PORTABLE CHEST - 1 VIEW  COMPARISON:  04/26/2014  FINDINGS: There is a left-sided Port-A-Cath with tip in the cavoatrial junction. There is generalized interstitial fluid or thickening which is increased from 04/26/2014. There is no airspace opacity. There is no large effusion.   IMPRESSION: Interstitial fluid or infiltrate, new from 04/26/2014.   Electronically Signed   By: Andreas Newport M.D.   On: 07/29/2014 00:32     ASSESSMENT / PLAN:  PULMONARY A:Risk pulm edema P:   pcxr Is if able cvp needed to guide  CARDIOVASCULAR CVL 3/19>>> A: Shock (septic), hypovolemia P:  Received more than 30 cc/kg Lactic increased, high risk mortality Place line STAT, get cvp, ensure 10 A line needed Cortisol the stress roids May need echo assessment svo2 Repeat lactic  RENAL A:  Oliguria, high risk ATN, ARF, hypovolemia P:   Send stat chem now Foley required cvp  GASTROINTESTINAL A:  Diarrhea, r/o chemo induced, r/o primary colitis / gastroenteritis, r/o cirhosis P:   stabilize then needs ct [name] ordered Bili repeat npo  HEMATOLOGIC A:  Neutropenia secondary to chemo,thrombocytopenia from chemo , sepsis, coagulapathic from coumadin , sepsis P:  Consider neupogen Cbc now Follow plat count Cbc now scd Vit k , ffp if able before line, may not be able  INFECTIOUS A:  neutopenic septic shock, r/o GI source, r/i line P:   BCx 3/19>>> UC 3/29>>> Abx:  Cefepime 3/19>>> vanc 3/19>>>  Add oral vanc until cdiff back Add empiric caspofungin with line Follow BC sent Send additional BC [name] abdo, then CT if able  ENDOCRINE A:  R/o rel AI  P:   Cortisol stat Then stress roids cbg  NEUROLOGIC A:  Encephalaopthy septic P:   RASS goal: 0 May need ett then fent   FAMILY  - Updates: husband aware  - Inter-disciplinary family meet or Palliative Care meeting due by: 3/26  Ccm tim 60 min   [name]. Titus Mould, MD, Gas Pgr: [facility]   Pulmonary and [place] Pager: [phone]  07/29/2014, 8:14 AM

## 2014-07-29 NOTE — Assessment & Plan Note (Signed)
Patient complaining off and having several episodes of diarrhea within the past 24 hours; despite taking Imodium.  She feels fairly dehydrated today. Diarrhea could very likely be secondary to recent chemotherapy; or could be viral.  Patient was given Lomotil while in the infusion area today; the patient's diarrhea did become much worse.  Patient was having almost continuous liquid stool while in the infusion area.  Patient was admitted to the hospital; for further evaluation and management of diarrhea, nausea/vomiting, dehydration, and neutropenic fever.

## 2014-07-30 ENCOUNTER — Inpatient Hospital Stay (HOSPITAL_COMMUNITY): Payer: BLUE CROSS/BLUE SHIELD

## 2014-07-30 DIAGNOSIS — R7989 Other specified abnormal findings of blood chemistry: Secondary | ICD-10-CM | POA: Insufficient documentation

## 2014-07-30 DIAGNOSIS — A419 Sepsis, unspecified organism: Secondary | ICD-10-CM | POA: Insufficient documentation

## 2014-07-30 DIAGNOSIS — E1165 Type 2 diabetes mellitus with hyperglycemia: Secondary | ICD-10-CM

## 2014-07-30 DIAGNOSIS — I482 Chronic atrial fibrillation: Secondary | ICD-10-CM

## 2014-07-30 DIAGNOSIS — E440 Moderate protein-calorie malnutrition: Secondary | ICD-10-CM | POA: Insufficient documentation

## 2014-07-30 DIAGNOSIS — R945 Abnormal results of liver function studies: Secondary | ICD-10-CM

## 2014-07-30 DIAGNOSIS — I5032 Chronic diastolic (congestive) heart failure: Secondary | ICD-10-CM

## 2014-07-30 LAB — COMPREHENSIVE METABOLIC PANEL
ALT: 35 U/L (ref 0–35)
ANION GAP: 11 (ref 5–15)
AST: 36 U/L (ref 0–37)
Albumin: 1.9 g/dL — ABNORMAL LOW (ref 3.5–5.2)
Alkaline Phosphatase: 55 U/L (ref 39–117)
BUN: 20 mg/dL (ref 6–23)
CALCIUM: 6.5 mg/dL — AB (ref 8.4–10.5)
CO2: 16 mmol/L — AB (ref 19–32)
Chloride: 108 mmol/L (ref 96–112)
Creatinine, Ser: 0.75 mg/dL (ref 0.50–1.10)
GFR calc Af Amer: >90 mL/min (ref >90)
GFR calc non Af Amer: >90 mL/min (ref >90)
GLUCOSE: 152 mg/dL — AB (ref 70–99)
POTASSIUM: 3.1 mmol/L — AB (ref 3.5–5.1)
Sodium: 135 mmol/L (ref 135–145)
Total Bilirubin: 1.4 mg/dL — ABNORMAL HIGH (ref 0.3–1.2)
Total Protein: 4.2 g/dL — ABNORMAL LOW (ref 6.0–8.3)

## 2014-07-30 LAB — BASIC METABOLIC PANEL
Anion gap: 8 (ref 5–15)
Anion gap: 8 (ref 5–15)
Anion gap: 10 (ref 5–15)
BUN: 20 mg/dL (ref 6–23)
BUN: 17 mg/dL (ref 6–23)
BUN: 15 mg/dL (ref 6–23)
CALCIUM: 6.5 mg/dL — AB (ref 8.4–10.5)
CHLORIDE: 109 mmol/L (ref 96–112)
CHLORIDE: 105 mmol/L (ref 96–112)
CO2: 15 mmol/L — ABNORMAL LOW (ref 19–32)
CO2: 16 mmol/L — ABNORMAL LOW (ref 19–32)
CO2: 16 mmol/L — AB (ref 19–32)
Calcium: 6.5 mg/dL — ABNORMAL LOW (ref 8.4–10.5)
Calcium: 6.6 mg/dL — ABNORMAL LOW (ref 8.4–10.5)
Chloride: 109 mmol/L (ref 96–112)
Creatinine, Ser: 0.8 mg/dL (ref 0.50–1.10)
Creatinine, Ser: 0.67 mg/dL (ref 0.50–1.10)
Creatinine, Ser: 0.72 mg/dL (ref 0.50–1.10)
GFR calc Af Amer: >90 mL/min (ref >90)
GFR calc Af Amer: >90 mL/min (ref >90)
GFR calc non Af Amer: >90 mL/min (ref >90)
GFR calc non Af Amer: >90 mL/min (ref >90)
GFR, EST NON AFRICAN AMERICAN: 79 mL/min — AB (ref >90)
GLUCOSE: 122 mg/dL — AB (ref 70–99)
Glucose, Bld: 163 mg/dL — ABNORMAL HIGH (ref 70–99)
Glucose, Bld: 200 mg/dL — ABNORMAL HIGH (ref 70–99)
POTASSIUM: 3.4 mmol/L — AB (ref 3.5–5.1)
Potassium: 3.4 mmol/L — ABNORMAL LOW (ref 3.5–5.1)
Potassium: 2.9 mmol/L — ABNORMAL LOW (ref 3.5–5.1)
Sodium: 132 mmol/L — ABNORMAL LOW (ref 135–145)
Sodium: 133 mmol/L — ABNORMAL LOW (ref 135–145)
Sodium: 131 mmol/L — ABNORMAL LOW (ref 135–145)

## 2014-07-30 LAB — CBC WITH DIFFERENTIAL/PLATELET
BASOS PCT: 0 % (ref 0–1)
Basophils Absolute: 0 10*3/uL (ref 0.0–0.1)
EOS ABS: 0 10*3/uL (ref 0.0–0.7)
EOS PCT: 0 % (ref 0–5)
HCT: 30.9 % — ABNORMAL LOW (ref 36.0–46.0)
Hemoglobin: 10.8 g/dL — ABNORMAL LOW (ref 12.0–15.0)
Lymphocytes Relative: 7 % — ABNORMAL LOW (ref 12–46)
Lymphs Abs: 0 10*3/uL — ABNORMAL LOW (ref 0.7–4.0)
MCH: 30.5 pg (ref 26.0–34.0)
MCHC: 35 g/dL (ref 30.0–36.0)
MCV: 87.3 fL (ref 78.0–100.0)
MONO ABS: 0 10*3/uL — AB (ref 0.1–1.0)
Monocytes Relative: 43 % — ABNORMAL HIGH (ref 3–12)
NEUTROS ABS: 0.1 10*3/uL — AB (ref 1.7–7.7)
NEUTROS PCT: 50 % (ref 43–77)
Platelets: 40 10*3/uL — ABNORMAL LOW (ref 150–400)
RBC: 3.54 MIL/uL — ABNORMAL LOW (ref 3.87–5.11)
RDW: 13.8 % (ref 11.5–15.5)
WBC: 0.1 10*3/uL — AB (ref 4.0–10.5)

## 2014-07-30 LAB — URINE CULTURE
CULTURE: NO GROWTH
Colony Count: NO GROWTH

## 2014-07-30 LAB — GLUCOSE, CAPILLARY
GLUCOSE-CAPILLARY: 141 mg/dL — AB (ref 70–99)
GLUCOSE-CAPILLARY: 167 mg/dL — AB (ref 70–99)
GLUCOSE-CAPILLARY: 200 mg/dL — AB (ref 70–99)
GLUCOSE-CAPILLARY: 251 mg/dL — AB (ref 70–99)
Glucose-Capillary: 106 mg/dL — ABNORMAL HIGH (ref 70–99)
Glucose-Capillary: 149 mg/dL — ABNORMAL HIGH (ref 70–99)
Glucose-Capillary: 149 mg/dL — ABNORMAL HIGH (ref 70–99)

## 2014-07-30 LAB — BLOOD GAS, ARTERIAL
Acid-base deficit: 6.5 mmol/L — ABNORMAL HIGH (ref 0.0–2.0)
BICARBONATE: 15.2 meq/L — AB (ref 20.0–24.0)
Drawn by: 232811
O2 Content: 2 L/min
O2 SAT: 98.5 %
PATIENT TEMPERATURE: 98.3
TCO2: 13.8 mmol/L (ref 0–100)
pCO2 arterial: 20.5 mmHg — ABNORMAL LOW (ref 35.0–45.0)
pH, Arterial: 7.484 — ABNORMAL HIGH (ref 7.350–7.450)
pO2, Arterial: 96.8 mmHg (ref 80.0–100.0)

## 2014-07-30 LAB — PREPARE FRESH FROZEN PLASMA
Unit division: 0
Unit division: 0

## 2014-07-30 LAB — PROTIME-INR
INR: 2.44 — AB (ref 0.00–1.49)
PROTHROMBIN TIME: 26.7 s — AB (ref 11.6–15.2)

## 2014-07-30 LAB — MAGNESIUM: Magnesium: 1.5 mg/dL (ref 1.5–2.5)

## 2014-07-30 LAB — TROPONIN I: TROPONIN I: 0.12 ng/mL — AB (ref <0.031)

## 2014-07-30 LAB — FIBRINOGEN: Fibrinogen: 262 mg/dL (ref 204–475)

## 2014-07-30 LAB — APTT: APTT: 35 s (ref 24–37)

## 2014-07-30 MED ORDER — AMIODARONE IV BOLUS ONLY 150 MG/100ML
INTRAVENOUS | Status: AC
  Filled 2014-07-30: qty 100

## 2014-07-30 MED ORDER — AMIODARONE HCL IN DEXTROSE 360-4.14 MG/200ML-% IV SOLN
60.0000 mg/h | INTRAVENOUS | Status: AC
  Administered 2014-07-30: 60 mg/h via INTRAVENOUS
  Filled 2014-07-30: qty 200

## 2014-07-30 MED ORDER — MAGNESIUM SULFATE 4 GM/100ML IV SOLN
4.0000 g | Freq: Once | INTRAVENOUS | Status: AC
  Administered 2014-07-30: 4 g via INTRAVENOUS
  Filled 2014-07-30: qty 100

## 2014-07-30 MED ORDER — ACETAMINOPHEN 10 MG/ML IV SOLN
1000.0000 mg | Freq: Four times a day (QID) | INTRAVENOUS | Status: AC | PRN
  Administered 2014-07-30: 1000 mg via INTRAVENOUS
  Filled 2014-07-30 (×2): qty 100

## 2014-07-30 MED ORDER — VITAMIN K1 10 MG/ML IJ SOLN
5.0000 mg | Freq: Once | INTRAMUSCULAR | Status: AC
  Administered 2014-07-30: 5 mg via INTRAVENOUS
  Filled 2014-07-30: qty 0.5

## 2014-07-30 MED ORDER — POTASSIUM CHLORIDE 10 MEQ/50ML IV SOLN
10.0000 meq | INTRAVENOUS | Status: AC
  Administered 2014-07-30 (×4): 10 meq via INTRAVENOUS
  Filled 2014-07-30: qty 50

## 2014-07-30 MED ORDER — ALPRAZOLAM 0.5 MG PO TABS
0.5000 mg | ORAL_TABLET | Freq: Three times a day (TID) | ORAL | Status: DC | PRN
  Administered 2014-07-30 – 2014-08-08 (×11): 0.5 mg via ORAL
  Filled 2014-07-30 (×12): qty 1

## 2014-07-30 MED ORDER — LIP MEDEX EX OINT
TOPICAL_OINTMENT | CUTANEOUS | Status: AC
  Administered 2014-07-30: 18:00:00
  Filled 2014-07-30: qty 7

## 2014-07-30 MED ORDER — AMIODARONE LOAD VIA INFUSION
150.0000 mg | Freq: Once | INTRAVENOUS | Status: AC
  Administered 2014-07-30: 150 mg via INTRAVENOUS

## 2014-07-30 MED ORDER — AMIODARONE HCL IN DEXTROSE 360-4.14 MG/200ML-% IV SOLN
INTRAVENOUS | Status: AC
  Filled 2014-07-30: qty 200

## 2014-07-30 MED ORDER — SODIUM CHLORIDE 0.45 % IV SOLN
INTRAVENOUS | Status: DC
  Administered 2014-07-30: 12:00:00 via INTRAVENOUS

## 2014-07-30 MED ORDER — ALPRAZOLAM 0.25 MG PO TABS
0.2500 mg | ORAL_TABLET | Freq: Once | ORAL | Status: AC | PRN
  Administered 2014-07-30: 0.25 mg via ORAL
  Filled 2014-07-30: qty 1

## 2014-07-30 MED ORDER — AMIODARONE IV BOLUS ONLY 150 MG/100ML
150.0000 mg | Freq: Once | INTRAVENOUS | Status: AC
  Administered 2014-07-30: 150 mg via INTRAVENOUS

## 2014-07-30 MED ORDER — POTASSIUM CHLORIDE 10 MEQ/50ML IV SOLN
10.0000 meq | INTRAVENOUS | Status: AC
  Administered 2014-07-30 (×6): 10 meq via INTRAVENOUS
  Filled 2014-07-30: qty 50

## 2014-07-30 MED ORDER — POTASSIUM CHLORIDE 10 MEQ/50ML IV SOLN
INTRAVENOUS | Status: AC
  Filled 2014-07-30: qty 50

## 2014-07-30 MED ORDER — AMIODARONE IV BOLUS ONLY 150 MG/100ML
150.0000 mg | Freq: Once | INTRAVENOUS | Status: AC
  Administered 2014-07-30: 150 mg via INTRAVENOUS
  Filled 2014-07-30: qty 100

## 2014-07-30 MED ORDER — AMIODARONE HCL IN DEXTROSE 360-4.14 MG/200ML-% IV SOLN
30.0000 mg/h | INTRAVENOUS | Status: DC
  Administered 2014-07-31 – 2014-08-03 (×6): 30 mg/h via INTRAVENOUS
  Filled 2014-07-30 (×7): qty 200

## 2014-07-30 MED ORDER — CIPROFLOXACIN IN D5W 400 MG/200ML IV SOLN
400.0000 mg | Freq: Two times a day (BID) | INTRAVENOUS | Status: DC
  Administered 2014-07-30 – 2014-08-01 (×5): 400 mg via INTRAVENOUS
  Filled 2014-07-30 (×5): qty 200

## 2014-07-30 MED ORDER — POTASSIUM CHLORIDE 10 MEQ/50ML IV SOLN
10.0000 meq | INTRAVENOUS | Status: AC
  Administered 2014-07-30 (×4): 10 meq via INTRAVENOUS
  Filled 2014-07-30 (×5): qty 50

## 2014-07-30 MED ORDER — SODIUM BICARBONATE 8.4 % IV SOLN
INTRAVENOUS | Status: DC
  Administered 2014-07-30 (×2): via INTRAVENOUS
  Filled 2014-07-30 (×7): qty 150

## 2014-07-30 MED ORDER — METOPROLOL TARTRATE 25 MG PO TABS
25.0000 mg | ORAL_TABLET | Freq: Two times a day (BID) | ORAL | Status: DC
  Administered 2014-07-30 – 2014-08-01 (×4): 25 mg via ORAL
  Filled 2014-07-30 (×4): qty 1

## 2014-07-30 NOTE — Progress Notes (Signed)
eLink Physician-Brief Progress Note Patient Name: [name] DOB: 1955-06-07 MRN: [id number]   Date of Service  07/30/2014  HPI/Events of Note  Hypokalemia  eICU Interventions  Potassium replaced     Intervention Category Intermediate Interventions: Electrolyte abnormality - evaluation and management  Shaine Mount 07/30/2014, 5:44 AM

## 2014-07-30 NOTE — Progress Notes (Signed)
eLink Physician-Brief Progress Note Patient Name: [name] DOB: [birth year] MRN: [id number]   Date of Service  07/30/2014  HPI/Events of Note  Ventricular rate still between 140 and 160.  eICU Interventions  Will give 3rd bolus of Amiodarone 150 mg IV now. If this Amiodarone dose is not effective, will need to add a second agent for rate control.     Intervention Category Major Interventions: Arrhythmia - evaluation and management  [name],[name] 07/30/2014, 8:56 PM

## 2014-07-30 NOTE — Progress Notes (Signed)
eLink Physician-Brief Progress Note Patient Name: [name] DOB: [birth year] MRN: [id number]   Date of Service  07/30/2014  HPI/Events of Note  Ventricular rate 178 AFIB.   eICU Interventions  Will add amiodarone 150 mg IV load and IV infusion.      Intervention Category Major Interventions: Arrhythmia - evaluation and management  Cattaleya Wien Eugene 07/30/2014, 5:18 PM

## 2014-07-30 NOTE — Progress Notes (Signed)
Patient with heart rate 190-200, a fib.  Amiodarone 150 mg given earlier, with rate of 60mg /hr infusing.  Continues to have elevated heart rate.  rebolused with Amiodarone 150 mg IV over 10 min.  Heart rate remains elevated.  Continue to monitor closely.  Patient c/o of feeling anxious.  Provided reassurance to patient.  Continue to monitor.  Janeese Mcgloin Roselie Awkward RN

## 2014-07-30 NOTE — Progress Notes (Signed)
INITIAL NUTRITION ASSESSMENT  DOCUMENTATION CODES Per approved criteria  -Non-severe (moderate) malnutrition in the context of chronic illness -Obesity, unspecified  Pt meets criteria for moderate MALNUTRITION in the context of chronic illness as evidenced by energy intake <75% for >/=1 month and mild fluid accumulation.  INTERVENTION: Diet advancement per MD RD to continue to monitor for supplement needs  NUTRITION DIAGNOSIS: Inadequate oral intake related to inability to eat as evidenced by NPO status.   Goal: Pt to meet >/= 90% of their estimated nutrition needs   Monitor:  Diet advancement, weight, labs, I/O's  Reason for Assessment: Pt identified as at nutrition risk on the Malnutrition Screen Tool  Admitting Dx: Febrile neutropenia  ASSESSMENT: 59 y.o. female diagnosed with both breast cancer and ovarian cancer. Patient is status post Mastectomy and recent hysterectomy. Currently undergoing Letrozole oral therapy for her breast cancer. Also undergoing carboplatin Taxol chemotherapy regimen for treatment of her ovarian cancer. Patient has been complaining of chronic nausea since initiating chemotherapy. She reports vomiting once this morning; and has developed progressive diarrhea as well. Patient also was complaining of minimal appetite and very poor oral intake. She feels very dehydrated today.  Pt reports poor appetite and diarrhea d/t chemo. Pt does state that the diarrhea has improved somewhat. Pt with history having a PEG and received TF, states it was removed last year because she no longer needed it.  Pt reports her weight fluctuates between 180-200 lb.   Pt is currently NPO. Pt states she does not like Ensure supplements but will want to discuss her options as far as snacks or any other supplement options when her diet is advanced. RD to follow-up when advanced.  Nutrition focused physical exam shows no sign of depletion of muscle mass or body fat. Pt with +1  generalized, +1 RLE & +1 LLE edema.  Labs reviewed: Low K  Height: Ht Readings from Last 1 Encounters:  07/29/14 5\' 5"  (1.651 m)    Weight: Wt Readings from Last 1 Encounters:  07/30/14 181 lb 14.1 oz (82.5 kg)    Ideal Body Weight: 125 lb  % Ideal Body Weight: 145%  Wt Readings from Last 10 Encounters:  07/30/14 181 lb 14.1 oz (82.5 kg)  07/24/14 173 lb 8 oz (78.699 kg)  07/14/14 178 lb 4.8 oz (80.876 kg)  07/10/14 182 lb 4.8 oz (82.691 kg)  07/06/14 201 lb (91.173 kg)  06/27/14 201 lb 6 oz (91.343 kg)  06/23/14 201 lb 6.4 oz (91.354 kg)  06/16/14 198 lb 14.4 oz (90.22 kg)  05/15/14 195 lb (88.451 kg)  04/26/14 203 lb 3.2 oz (92.171 kg)    Usual Body Weight: 180-200 lb  % Usual Body Weight: 100%  BMI:  Body mass index is 30.27 kg/(m^2).  Estimated Nutritional Needs: Kcal: 1900-2100 Protein: 80-90g Fluid: 1.9L/day  Skin: intact  Diet Order: Diet NPO time specified  EDUCATION NEEDS: -No education needs identified at this time   Intake/Output Summary (Last 24 hours) at 07/30/14 1030 Last data filed at 07/30/14 1000  Gross per 24 hour  Intake   4266 ml  Output   4090 ml  Net    176 ml    Last BM: 3/20   Labs:   Recent Labs Lab 07/29/14 1448 07/29/14 2330 07/30/14 0430  NA 132* 132* 135  K 3.4* 3.4* 3.1*  CL 108 109 108  CO2 13* 15* 16*  BUN 21 20 20   CREATININE 0.99 0.80 0.75  CALCIUM 6.6* 6.5* 6.5*  GLUCOSE  213* 122* 152*    CBG (last 3)   Recent Labs  07/30/14 0018 07/30/14 0359 07/30/14 0800  GLUCAP 141* 149* 149*    Scheduled Meds: . ceFEPime (MAXIPIME) IV  2 g Intravenous 3 times per day  . ciprofloxacin  400 mg Intravenous Q12H  . filgrastim (NEUPOGEN) 480 mcg IVPB  480 mcg Intravenous Daily  . hydrocortisone sodium succinate  50 mg Intravenous Q6H  . insulin aspart  0-9 Units Subcutaneous 6 times per day  . letrozole  2.5 mg Oral Daily  . levothyroxine  150 mcg Oral QAC breakfast  . pantoprazole  40 mg Oral Daily  .  potassium chloride  10 mEq Oral BID  . sodium chloride  10-40 mL Intracatheter Q12H    Continuous Infusions: . sodium chloride 10 mL/hr at 07/30/14 0813  .  sodium bicarbonate  infusion 1000 mL 75 mL/hr at 07/30/14 4270    Past Medical History  Diagnosis Date  . Depression   . DVT (deep venous thrombosis)     hx of on HRT left leg ~2006  . GERD (gastroesophageal reflux disease)   . Hyperlipidemia   . Hypertension   . Hypothyroidism   . PPD positive, treated     rx inh   . Diabetes mellitus   . 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 DJD (degenerative joint disease) of lumbar spine   . Chronic diastolic CHF (congestive heart failure)     a. 08/2012 Echo: EF 55-60%, no rwma, mod MR.  [name] A-fib     a. on amio for rate control/coumadin.  [name] Physical deconditioning   . Allergy   . Anemia     low iron hx  . Blood transfusion without reported diagnosis     2 u prbc  . Cataract     removed ou  . OSA on CPAP     cpap 4.5 setting  . Breast cancer 2014    a. 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 Tubal ligation    . Cholecystectomy    . Foot surgery    . Eye surgery    . Cataract extraction    . Percutaneous liver biopsy    . Breast biopsy      left breast  . Mastectomy modified radical  06/08/2012    Procedure: MASTECTOMY MODIFIED RADICAL;  Surgeon: Edward Jolly, MD;  Location: [facility];  Service: General;  Laterality: Right;  . Portacath placement  06/08/2012    Procedure: INSERTION PORT-A-CATH;  Surgeon: Edward Jolly, MD;  Location: [facility];  Service: General;  Laterality: Left;  . Peg tube placement      peg removed 2015  . History of chemotherapy x 2 treatments, radiation tx  2014  . Pac removed  2015  . Laparotomy N/A 06/27/2014    Procedure: EXPLORATORY LAPAROTOMY;  Surgeon:  Everitt Amber, MD;  Location: [facility];  Service: Gynecology;  Laterality: N/A;  . Abdominal hysterectomy N/A 06/27/2014    Procedure: HYSTERECTOMY ABDOMINAL TOTAL;  Surgeon: Everitt Amber, MD;  Location: [facility];  Service: Gynecology;  Laterality: N/A;  . Salpingoophorectomy Bilateral 06/27/2014    Procedure: BILATERAL SALPINGO OOPHORECTOMY/TUMOR DEBULKING/LYMPHNODE DISSECTION, OMENTECTOMY;  Surgeon: Everitt Amber, MD;  Location: [facility];  Service: Gynecology;  Laterality: Bilateral;    Clayton Bibles, MS,  RD, LDN Pager: [phone] After Hours Pager: [phone]

## 2014-07-30 NOTE — Progress Notes (Signed)
[facility] ADULT ICU REPLACEMENT PROTOCOL FOR AM LAB REPLACEMENT ONLY  The patient does not apply for the [facility] Adult ICU Electrolyte Replacment Protocol based on the criteria listed below:    2. Is urine output >/= 0.5 ml/kg/hr for the last 6 hours? No. Patient's UOP is 0.4 ml/kg/hr  4. Abnormal electrolyte(s): K3.1   6. If a panic level lab has been reported, has the CCM MD in charge been notified? Yes.   Physician:  E Deterding,MD   Vear Clock 07/30/2014 5:40 AM

## 2014-07-30 NOTE — Progress Notes (Signed)
Called [facility] ref high HR and Aline pressures. Orders rec

## 2014-07-30 NOTE — Progress Notes (Signed)
Called elink again and left message due to patient in 1225 with increased heart rate.  Patient with heart rate 150-170, and patient in afib.  No new orders.

## 2014-07-30 NOTE — Progress Notes (Signed)
eLink Physician-Brief Progress Note Patient Name: [name] DOB: 01/22/1956 MRN: [id number]   Date of Service  07/30/2014  HPI/Events of Note  Ventricular rate now = 190. Runs of NSVT.  eICU Interventions  Will order:      1. Magnesium and BMP now.      2. Troponin 1 now and Q 6 hours X 2 (3 sets)      3. Rebolus with Amiodarone 150 mg IV over 10 minutes now.      Intervention Category Major Interventions: Arrhythmia - evaluation and management  Sommer,Steven Cornelia Copa 07/30/2014, 5:53 PM

## 2014-07-30 NOTE — Progress Notes (Signed)
eLink Physician-Brief Progress Note Patient Name: AMBERLEA SPAGNUOLO DOB: March 16, 1956 MRN: [id number]   Date of Service  07/30/2014  HPI/Events of Note  Chronic AFIB with ventricular rate = 130's.  eICU Interventions  Restart home Metoprolol 25 mg PO BID.     Intervention Category Major Interventions: Arrhythmia - evaluation and management  Laterrian Hevener Eugene 07/30/2014, 4:53 PM

## 2014-07-30 NOTE — Progress Notes (Signed)
Patient with increased heart rate, and patient in a fib.  Called e link and left message. Patient has heart rate 130-150.  No new orders.

## 2014-07-30 NOTE — Consult Note (Signed)
PULMONARY / CRITICAL CARE MEDICINE   Name: [name] MRN: [id number] DOB: 02-18-1956    ADMISSION DATE:  07/28/2014 CONSULTATION DATE:  07/29/14  REFERRING MD :  [facility]  CHIEF COMPLAINT:  shock  INITIAL PRESENTATION: 59 yr old s/p chemo ovarian ca and Breast Ca, shock  STUDIES:  3/19 [name] abdo>>>hepatic parenchymal disease, used which can include steatosis. Left trace fluid seen around the liver.  SIGNIFICANT EVENTS: 3/19- 8 liters given, levo ordered 3/19 - shock, line, pressors, low output 3/20- off pressors, high stool output remains  SUBJECTIVE: increased stool remains  VITAL SIGNS: Temp:  [98.3 F (36.8 C)-99.8 F (37.7 C)] 98.3 F (36.8 C) (03/20 0353) Pulse Rate:  [99-125] 101 (03/20 0600) Resp:  [20-36] 31 (03/20 0600) BP: (130)/(43) 130/43 mmHg (03/19 1943) SpO2:  [93 %-100 %] 97 % (03/20 0600) Arterial Line BP: (47-135)/(29-57) 103/48 mmHg (03/20 0600) Weight:  [82.5 kg (181 lb 14.1 oz)] 82.5 kg (181 lb 14.1 oz) (03/20 0500) HEMODYNAMICS: CVP:  [12 mmHg-16 mmHg] 14 mmHg VENTILATOR SETTINGS:   INTAKE / OUTPUT:  Intake/Output Summary (Last 24 hours) at 07/30/14 0806 Last data filed at 07/30/14 0700  Gross per 24 hour  Intake   4986 ml  Output   3100 ml  Net   1886 ml    PHYSICAL EXAMINATION: General:  Awake, mild lethargy, nonfocal Neuro:  Moves upper ext equal , perrl, int lethargy HEENT: line rt ij clean Cardiovascular:  s1 s2 RRT no m Lungs:  Mild crackels bases, CTA anterior port clean Abdomen:  Soft, scar healed, no r/g, NT Musculoskeletal:  No pain, no edema Skin:  No rash  LABS:  CBC  Recent Labs Lab 07/28/14 1443 07/29/14 0956 07/30/14 0430  WBC < 0.2* 0.2* 0.1*  HGB 13.5 12.7 10.8*  HCT 38.6 36.5 30.9*  PLT 91* 74* 40*   Coag's  Recent Labs Lab 07/28/14 2355 07/29/14 1632 07/30/14 0430  APTT  --   --  35  INR 6.02* 2.80* 2.44*   BMET  Recent Labs Lab 07/29/14 1448 07/29/14 2330 07/30/14 0430  NA 132* 132* 135   K 3.4* 3.4* 3.1*  CL 108 109 108  CO2 13* 15* 16*  BUN 21 20 20   CREATININE 0.99 0.80 0.75  GLUCOSE 213* 122* 152*   Electrolytes  Recent Labs Lab 07/29/14 1448 07/29/14 2330 07/30/14 0430  CALCIUM 6.6* 6.5* 6.5*   Sepsis Markers  Recent Labs Lab 07/28/14 2355 07/29/14 0626 07/29/14 1318  LATICACIDVEN 5.8* 7.7* 6.0*   ABG  Recent Labs Lab 07/29/14 0824 07/30/14 0420  PHART 7.348* 7.484*  PCO2ART 21.6* 20.5*  PO2ART 81.0 96.8   Liver Enzymes  Recent Labs Lab 07/28/14 1443 07/29/14 0956 07/30/14 0430  AST 21 36 36  ALT 42 36* 35  ALKPHOS 102 67 55  BILITOT 1.72* 1.3* 1.4*  ALBUMIN 2.7* 2.1* 1.9*   Cardiac Enzymes  Recent Labs Lab 07/29/14 0626  TROPONINI <0.03   Glucose  Recent Labs Lab 07/29/14 0920 07/29/14 1226 07/29/14 1639 07/29/14 1949 07/30/14 0018 07/30/14 0359  GLUCAP 137* 229* 139* 106* 141* 149*    Imaging US Abdomen Complete  07/29/2014   CLINICAL DATA:  Initial evaluation elevated liver function enzymes, personal history of cholecystectomy, breast cancer, ovarian cancer, currently on chemotherapy  EXAM: ULTRASOUND ABDOMEN COMPLETE  COMPARISON:  None.  FINDINGS: Gallbladder: Surgically absent  Common bile duct: Diameter: 5 mm  Liver: Heterogeneous echotexture. No focal abnormalities. Trace fluid identified around the liver.  IVC: No  abnormality visualized.  Pancreas: Not identified  Spleen: Normal with a span of 10 cm  Right Kidney: Length: 12.8 cm. Echogenicity within normal limits. No mass or hydronephrosis visualized.  Left Kidney: Length: 11.1 cm. Echogenicity within normal limits. No mass or hydronephrosis visualized.  Abdominal aorta: Not well seen.  Other findings: None.  IMPRESSION: Hit coarsened heterogeneous hepatic echotexture suggesting a hepatic parenchymal disease, used which can include steatosis. Left trace fluid seen around the liver.   Electronically Signed   By: Skipper Cliche M.D.   On: 07/29/2014 11:37   Dg  Chest Port 1 View  07/29/2014   CLINICAL DATA:  Acute sepsis, heart failure, history of breast cancer  EXAM: PORTABLE CHEST - 1 VIEW  COMPARISON:  07/28/2014  FINDINGS: Left IJ port catheter tip SVC RA junction. New right IJ central line tip lower SVC level. Heart is enlarged with slight increased vascular and interstitial changes concerning for developing edema. No focal pneumonia, collapse or consolidation. No effusion or pneumothorax. Trachea midline. Postop changes from the right breast surgery and axillary lymph node dissection.  IMPRESSION: Right IJ central line tip lower SVC level.  Mild developing edema pattern.   Electronically Signed   By: Jerilynn Mages.  Shick M.D.   On: 07/29/2014 09:54     ASSESSMENT / PLAN:  PULMONARY A:Risk pulm edema, some ali? P:   pcxr improved Is  CARDIOVASCULAR CVL 3/19>>> A: Shock (septic), hypovolemia from GI loss P:  cvp 8 Keep pos balance Chem in am  MAP off pressors  RENAL A:  NONAG from loose stools, high risk ATN, ARF, hypovolemia, hypoK P:   Consider bicarb for active NON AG losses Avoid saline k supp, recheck in pm  GASTROINTESTINAL A:  Diarrhea chemo induced, Unlikely primary colitis / gastroenteritis, r/o cirhosis P:   [name] unimpressive Bili repeat Start clears ppi  HEMATOLOGIC A:  Neutropenia and pancytopenia secondary to chemo /sepsis, coagulapathic from coumadin , sepsis P:  Consider neupogen x 3 days then dc Cbc in am  scd Vit k  No role ffp Repeat coags in am  Fibrinogen x 1  INFECTIOUS A:  neutopenic septic shock, r/o (favor) GI source (translocation), r/o line P:   BCx 3/19>>>2/2 gram neg rod>>> UC 3/29>>> Abx:  Cefepime 3/19>>> cipro 3/19>>> vanc 3/19>>>3/20 Flagyl 3/19>>>3/20 myco 3/19>>>3/19  Dc flagyl, vanc Follow ID gram neg rod Keep port, clinically improved  ENDOCRINE A:  R/o rel AI  P:   Cortisol pending stress roids, reduce in am as off presors, ro dc idf cortisol back greater  20 cbg  NEUROLOGIC A:  Encephalaopthy septic - resolving P:   RASS goal: 0   FAMILY  - Updates: husband daily  - Inter-disciplinary family meet or Palliative Care meeting due by: 3/26   [name]. Titus Mould, MD, [place]ena Pgr: [facility]   Pulmonary and [place] Pager: [phone]  07/30/2014, 8:06 AM

## 2014-07-30 NOTE — Progress Notes (Signed)
eLink Physician-Brief Progress Note Patient Name: [name] DOB: Oct 04, 1955 MRN: [id number]   Date of Service  07/30/2014  HPI/Events of Note  K+ = 2.9, Mg++ = 1.5 and Creatinine = 0.72.  eICU Interventions  Replete K+ and Mg++.     Intervention Category Major Interventions: Electrolyte abnormality - evaluation and management  Sommer,Steven Eugene 07/30/2014, 7:13 PM

## 2014-07-30 NOTE — Progress Notes (Signed)
Labs called to e link doctor, and new orders seen.  HR continues to be elevated, a fib.  Continue to monitor closely.  Mitch Arquette Roselie Awkward RN

## 2014-07-31 ENCOUNTER — Inpatient Hospital Stay (HOSPITAL_COMMUNITY): Payer: BLUE CROSS/BLUE SHIELD

## 2014-07-31 ENCOUNTER — Ambulatory Visit: Payer: Self-pay

## 2014-07-31 ENCOUNTER — Other Ambulatory Visit: Payer: Self-pay

## 2014-07-31 ENCOUNTER — Ambulatory Visit: Payer: Self-pay | Admitting: Nurse Practitioner

## 2014-07-31 DIAGNOSIS — D709 Neutropenia, unspecified: Secondary | ICD-10-CM

## 2014-07-31 DIAGNOSIS — M810 Age-related osteoporosis without current pathological fracture: Secondary | ICD-10-CM

## 2014-07-31 DIAGNOSIS — C561 Malignant neoplasm of right ovary: Secondary | ICD-10-CM

## 2014-07-31 DIAGNOSIS — K760 Fatty (change of) liver, not elsewhere classified: Secondary | ICD-10-CM

## 2014-07-31 DIAGNOSIS — C773 Secondary and unspecified malignant neoplasm of axilla and upper limb lymph nodes: Secondary | ICD-10-CM

## 2014-07-31 DIAGNOSIS — I10 Essential (primary) hypertension: Secondary | ICD-10-CM

## 2014-07-31 DIAGNOSIS — K7460 Unspecified cirrhosis of liver: Secondary | ICD-10-CM

## 2014-07-31 DIAGNOSIS — E119 Type 2 diabetes mellitus without complications: Secondary | ICD-10-CM

## 2014-07-31 DIAGNOSIS — C50011 Malignant neoplasm of nipple and areola, right female breast: Secondary | ICD-10-CM

## 2014-07-31 DIAGNOSIS — R911 Solitary pulmonary nodule: Secondary | ICD-10-CM

## 2014-07-31 LAB — CBC WITH DIFFERENTIAL/PLATELET
BASOS PCT: 0 % (ref 0–1)
Basophils Absolute: 0 10*3/uL (ref 0.0–0.1)
EOS PCT: 0 % (ref 0–5)
Eosinophils Absolute: 0 10*3/uL (ref 0.0–0.7)
HCT: 31.4 % — ABNORMAL LOW (ref 36.0–46.0)
HEMOGLOBIN: 11.1 g/dL — AB (ref 12.0–15.0)
Lymphocytes Relative: 31 % (ref 12–46)
Lymphs Abs: 0.2 10*3/uL — ABNORMAL LOW (ref 0.7–4.0)
MCH: 30.5 pg (ref 26.0–34.0)
MCHC: 35.4 g/dL (ref 30.0–36.0)
MCV: 86.3 fL (ref 78.0–100.0)
Monocytes Absolute: 0.2 10*3/uL (ref 0.1–1.0)
Monocytes Relative: 40 % — ABNORMAL HIGH (ref 3–12)
NEUTROS PCT: 29 % — AB (ref 43–77)
Neutro Abs: 0.2 10*3/uL — ABNORMAL LOW (ref 1.7–7.7)
PLATELETS: 35 10*3/uL — AB (ref 150–400)
RBC: 3.64 MIL/uL — AB (ref 3.87–5.11)
RDW: 14.1 % (ref 11.5–15.5)
WBC: 0.6 10*3/uL — AB (ref 4.0–10.5)

## 2014-07-31 LAB — CULTURE, BLOOD (ROUTINE X 2)

## 2014-07-31 LAB — COMPREHENSIVE METABOLIC PANEL
ALK PHOS: 56 U/L (ref 39–117)
ALT: 34 U/L (ref 0–35)
AST: 26 U/L (ref 0–37)
Albumin: 1.9 g/dL — ABNORMAL LOW (ref 3.5–5.2)
Anion gap: 9 (ref 5–15)
BUN: 12 mg/dL (ref 6–23)
CALCIUM: 6.7 mg/dL — AB (ref 8.4–10.5)
CO2: 20 mmol/L (ref 19–32)
Chloride: 101 mmol/L (ref 96–112)
Creatinine, Ser: 0.6 mg/dL (ref 0.50–1.10)
GFR calc Af Amer: >90 mL/min (ref >90)
Glucose, Bld: 235 mg/dL — ABNORMAL HIGH (ref 70–99)
POTASSIUM: 2.7 mmol/L — AB (ref 3.5–5.1)
SODIUM: 130 mmol/L — AB (ref 135–145)
TOTAL PROTEIN: 4.5 g/dL — AB (ref 6.0–8.3)
Total Bilirubin: 1.2 mg/dL (ref 0.3–1.2)

## 2014-07-31 LAB — TROPONIN I
TROPONIN I: 0.06 ng/mL — AB (ref <0.031)
Troponin I: 0.08 ng/mL — ABNORMAL HIGH (ref <0.031)

## 2014-07-31 LAB — PROTIME-INR
INR: 1.9 — ABNORMAL HIGH (ref 0.00–1.49)
Prothrombin Time: 21.9 seconds — ABNORMAL HIGH (ref 11.6–15.2)

## 2014-07-31 LAB — GLUCOSE, CAPILLARY
GLUCOSE-CAPILLARY: 219 mg/dL — AB (ref 70–99)
GLUCOSE-CAPILLARY: 212 mg/dL — AB (ref 70–99)
GLUCOSE-CAPILLARY: 166 mg/dL — AB (ref 70–99)
GLUCOSE-CAPILLARY: 177 mg/dL — AB (ref 70–99)
Glucose-Capillary: 181 mg/dL — ABNORMAL HIGH (ref 70–99)
Glucose-Capillary: 186 mg/dL — ABNORMAL HIGH (ref 70–99)
Glucose-Capillary: 199 mg/dL — ABNORMAL HIGH (ref 70–99)

## 2014-07-31 LAB — BASIC METABOLIC PANEL
ANION GAP: 6 (ref 5–15)
BUN: 11 mg/dL (ref 6–23)
CHLORIDE: 103 mmol/L (ref 96–112)
CO2: 22 mmol/L (ref 19–32)
Calcium: 6.8 mg/dL — ABNORMAL LOW (ref 8.4–10.5)
Creatinine, Ser: 0.55 mg/dL (ref 0.50–1.10)
GFR calc Af Amer: >90 mL/min (ref >90)
GFR calc non Af Amer: >90 mL/min (ref >90)
Glucose, Bld: 178 mg/dL — ABNORMAL HIGH (ref 70–99)
Potassium: 3.4 mmol/L — ABNORMAL LOW (ref 3.5–5.1)
SODIUM: 131 mmol/L — AB (ref 135–145)

## 2014-07-31 LAB — CORTISOL

## 2014-07-31 LAB — CULTURE, BLOOD (SINGLE)

## 2014-07-31 LAB — APTT: aPTT: 31 seconds (ref 24–37)

## 2014-07-31 LAB — CLOSTRIDIUM DIFFICILE BY PCR: CDIFFPCR: NEGATIVE

## 2014-07-31 MED ORDER — POTASSIUM CHLORIDE 10 MEQ/50ML IV SOLN
10.0000 meq | INTRAVENOUS | Status: AC
  Administered 2014-07-31 (×6): 10 meq via INTRAVENOUS
  Filled 2014-07-31 (×5): qty 50

## 2014-07-31 MED ORDER — ALBUMIN HUMAN 5 % IV SOLN
12.5000 g | Freq: Once | INTRAVENOUS | Status: AC
  Administered 2014-07-31: 12.5 g via INTRAVENOUS
  Filled 2014-07-31: qty 250

## 2014-07-31 MED ORDER — SODIUM CHLORIDE 0.9 % IV BOLUS (SEPSIS)
500.0000 mL | Freq: Once | INTRAVENOUS | Status: AC
  Administered 2014-07-31: 500 mL via INTRAVENOUS

## 2014-07-31 NOTE — Progress Notes (Addendum)
ANTIBIOTIC CONSULT NOTE - follow-up  Pharmacy Consult for cefepime Indication: febrile neutropenia  Allergies  Allergen Reactions  . Olmesartan Medoxomil Cough    REACTION: ? if cough  . Tetracycline Hcl     Unknown reaction, too long for patient to remember   . Venlafaxine     REACTION: severe dry moouth  . Adhesive [Tape] Rash    Patient Measurements: Height: 5\' 5"  (165.1 cm) Weight: 181 lb 14.1 oz (82.5 kg) IBW/kg (Calculated) : 57  Vital Signs: Temp: 98 F (36.7 C) (03/21 0800) Temp Source: Oral (03/21 0800) BP: 86/52 mmHg (03/21 1600) Pulse Rate: 129 (03/21 1600) Intake/Output from previous day: 03/20 0701 - 03/21 0700 In: 4715 [P.O.:480; I.V.:2710; IV Piggyback:1525] Out: 3630 [Urine:2080; CHENI:7782] Intake/Output from this shift: Total I/O In: 1873.6 [I.V.:898.6; IV Piggyback:975] Out: 350 [Urine:350]  Labs:  Recent Labs  07/29/14 0956  07/30/14 0430  07/30/14 1750 07/31/14 0500 07/31/14 1450  WBC 0.2*  --  0.1*  --   --  0.6*  --   HGB 12.7  --  10.8*  --   --  11.1*  --   PLT 74*  --  40*  --   --  35*  --   CREATININE 1.14*  < > 0.75  < > 0.72 0.60 0.55  < > = values in this interval not displayed. Estimated Creatinine Clearance: 79.3 mL/min (by C-G formula based on Cr of 0.55). No results for input(s): VANCOTROUGH, VANCOPEAK, VANCORANDOM, GENTTROUGH, GENTPEAK, GENTRANDOM, TOBRATROUGH, TOBRAPEAK, TOBRARND, AMIKACINPEAK, AMIKACINTROU, AMIKACIN in the last 72 hours.   Microbiology: Recent Results (from the past 720 hour(s))  TECHNOLOGIST REVIEW     Status: None   Collection Time: 07/28/14  2:43 PM  Result Value Ref Range Status   Technologist Review Occasional neutrophil and lymph seen on scan   Final  Blood culture (routine single)     Status: None   Collection Time: 07/28/14  2:49 PM  Result Value Ref Range Status   Blood Culture, Routine Culture, Blood  Final    Comment:  ------------------------------------------------------------------------Gram Stain Report Called to,Read Back Byand Verified With:GAIL VANDENBERG3/19/16 AT [id number] - ===== FINAL REPORT =====KLEBSIELLA  PNEUMONIAE------------------------------------------------------------------------ KLEBSIELLA PNEUMONIAE   AMPICILLIN                       MIC      Resistant            ug/ml   AMOX/CLAVULANIC                  MIC      Sensitive          4 ug/ml    AMPICILLIN/SUL                   MIC      Sensitive          4 ug/ml   PIPERACILLIN/TAZO                MIC      Sensitive        <=4 ug/ml   IMIPENEM                         MIC      Sensitive     <=0.25 ug/ml   CEFAZOLIN                        MIC  Sensitive        <=4 ug/ml   CEFTRIAXONE                      MIC      Sensitive        <=1 ug/ml   CEFTAZIDIME                      MIC      Sensitive        <=1 ug/ml   CEFEPIME                         MIC      Sensitive        <=1 ug/ml   GENTAMICIN                        MIC       Sensitive        <=1 ug/ml   TOBRAMYCIN                       MIC      Sensitive        <=1 ug/ml   CIPROFLOXACIN                    MIC      Sensitive     <=0.25 ug/ml   LEVOFLOXACIN                     MIC      Sensitive      <=0.12 ug/ml   TRIMETH/SULFA                    MIC      Sensitive       <=20 ug/mlEND OF REPORT   Culture, blood (routine x 2)     Status: None   Collection Time: 07/28/14 10:55 PM  Result Value Ref Range Status   Specimen Description BLOOD LEFT FOREARM  Final   Special Requests BOTTLES DRAWN AEROBIC ONLY 4CC  Final   Culture   Final    KLEBSIELLA OXYTOCA Note: Gram Stain Report Called to,Read Back By and Verified With: AMY ARNOLD RN @4PM  VINCJ 07/29/14 Performed at [facility]    Report Status 07/31/2014 FINAL  Final   Organism ID, Bacteria KLEBSIELLA OXYTOCA  Final      Susceptibility   Klebsiella oxytoca - MIC*    AMPICILLIN >=32 RESISTANT Resistant     AMPICILLIN/SULBACTAM 8  SENSITIVE Sensitive     CEFAZOLIN <=4 SENSITIVE Sensitive     CEFEPIME <=1 SENSITIVE Sensitive     CEFTAZIDIME <=1 SENSITIVE Sensitive     CEFTRIAXONE <=1 SENSITIVE Sensitive     CIPROFLOXACIN <=0.25 SENSITIVE Sensitive     GENTAMICIN <=1 SENSITIVE Sensitive     IMIPENEM <=0.25 SENSITIVE Sensitive     PIP/TAZO <=4 SENSITIVE Sensitive     TOBRAMYCIN <=1 SENSITIVE Sensitive     TRIMETH/SULFA <=20 SENSITIVE Sensitive     * KLEBSIELLA OXYTOCA  Stool culture     Status: None (Preliminary result)   Collection Time: 07/29/14  1:39 AM  Result Value Ref Range Status   Specimen Description STOOL  Final   Special Requests NONE  Final   Culture   Final    NO SUSPICIOUS COLONIES, CONTINUING TO HOLD Performed at [facility]  Report Status PENDING  Incomplete  Culture, Urine     Status: None   Collection Time: 07/29/14  6:14 AM  Result Value Ref Range Status   Specimen Description URINE, CATHETERIZED  Final   Special Requests NONE  Final   Colony Count NO GROWTH Performed at [facility]   Final   Culture NO GROWTH Performed at [facility]   Final   Report Status 07/30/2014 FINAL  Final  MRSA PCR Screening     Status: None   Collection Time: 07/29/14 10:31 AM  Result Value Ref Range Status   MRSA by PCR NEGATIVE NEGATIVE Final    Comment:        The GeneXpert MRSA Assay (FDA approved for NASAL specimens only), is one component of a comprehensive MRSA colonization surveillance program. It is not intended to diagnose MRSA infection nor to guide or monitor treatment for MRSA infections.     Medical History: Past Medical History  Diagnosis Date  . Depression   . DVT (deep venous thrombosis)     hx of on HRT left leg ~2006  . GERD (gastroesophageal reflux disease)   . Hyperlipidemia   . Hypertension   . Hypothyroidism   . PPD positive, treated     rx inh   . Diabetes mellitus   . 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 DJD  (degenerative joint disease) of lumbar spine   . Chronic diastolic CHF (congestive heart failure)     a. 08/2012 Echo: EF 55-60%, no rwma, mod MR.  [name] A-fib     a. on amio for rate control/coumadin.  [name] Physical deconditioning   . Allergy   . Anemia     low iron hx  . Blood transfusion without reported diagnosis     2 u prbc  . Cataract     removed ou  . OSA on CPAP     cpap 4.5 setting  . Breast cancer 2014    a. 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 ceFEPime (MAXIPIME) IV  2 g Intravenous 3 times per day  . ciprofloxacin  400 mg Intravenous Q12H  . insulin aspart  0-9 Units Subcutaneous 6 times per day  . letrozole  2.5 mg Oral Daily  . levothyroxine  150 mcg Oral QAC breakfast  . metoprolol tartrate  25 mg Oral BID  . pantoprazole  40 mg Oral Daily  . sodium chloride  10-40 mL Intracatheter Q12H   Infusions:  . sodium chloride 10 mL/hr at 07/30/14 1142  . amiodarone 30 mg/hr (07/31/14 1231)  .  sodium bicarbonate  infusion 1000 mL 75 mL/hr at 07/30/14 0908     Assessment: 59 yo presents to [facility] with FN to treat with vanc/cefepime per pharmacy dosing. Patient with breast and ovarian cancer currently receiving letrozole and cabo/Taxol, cycle 2 completed 3/14 with Neulasta support.   3/18 >> vanc >> 3/19 3/18 >> cefepime >>   3/19 >> micafungin x 1 3/19 >> cipro >>  3/19 >> flagyl >> 3/20  Tmax: 101.9 on 3/20 --> now afebrile WBCs: 0.6, ANC 0.2 (neulasta 3/15, neupogen 3/19 >> 3/21) Renal: SCr improved, CrCl 79, good UOP Lactic acid: 5.8-->7.7-->6.0 (3/19)  3/18 blood: K oxytoca, R - Amp only 3/18 urine: NGF (U/A amber/cloudy) 3/19 stool: IP 3/19 MRSA PCR negative  Goal of Therapy:   Eradication of infection  Appropriate antibiotic dosing for indication and renal function  Plan:   Continue cefepime 2g IV q8h for  febrile neutropenia/kebsiella bacteremia.  Consider narrowing to Ancef or Rocephin once clinically stable, given culture results and concern for possible C diff (higher risk w/ fluoroquinolones)  Follow clinical course, culture results as available, renal function  Follow for de-escalation of antibiotics and LOT   Reuel Boom, PharmD Pager: [phone] 07/31/2014, 4:42 PM

## 2014-07-31 NOTE — Progress Notes (Signed)
eLink Physician-Brief Progress Note Patient Name: [name] DOB: 1955/07/31 MRN: [id number]   Date of Service  07/31/2014  HPI/Events of Note  Low k   eICU Interventions  k iv     Intervention Category Intermediate Interventions: Electrolyte abnormality - evaluation and management  [name]. 07/31/2014, 6:11 AM

## 2014-07-31 NOTE — Progress Notes (Signed)
[name]   DOB:13-Oct-1955   HO#:[id number]   BBC#:[id number]  Subjective: [name] is alert, anxious; rectal tube is still putting out about 1L/ shift according to nurse. She was able to take a can of ensure last night. She feels "a little better." Don her husband is in room   Objective: middle aged White woman examined in bed Filed Vitals:   07/31/14 0700  BP:   Pulse: 156  Temp:   Resp: 20    Body mass index is 30.27 kg/(m^2).  Intake/Output Summary (Last 24 hours) at 07/31/14 0809 Last data filed at 07/31/14 0701  Gross per 24 hour  Intake   4695 ml  Output   3480 ml  Net   1215 ml    CPAP in place  Sclerae unicteric  Lungs clear -- auscultated anterolaterally  Heart rapid rate  Abdomen soft, NT, increased BS  Neuro alert, well-oriented, nonfocal exam  Breast exam: deferred  CBG (last 3)   Recent Labs  07/30/14 1547 07/30/14 2345 07/31/14 0347  GLUCAP 167* 251* 212*     Labs:  Lab Results  Component Value Date   WBC 0.6* 07/31/2014   HGB 11.1* 07/31/2014   HCT 31.4* 07/31/2014   MCV 86.3 07/31/2014   PLT 35* 07/31/2014   NEUTROABS 0.2* 07/31/2014    _0 @  Urine Studies No results for input(s): UHGB, CRYS in the last 72 hours.  Invalid input(s): UACOL, UAPR, USPG, UPH, UTP, UGL, UKET, UBIL, UNIT, UROB, [place], UEPI, UWBC, Junie Panning [place], [place], [place]  Basic Metabolic Panel:  Recent Labs Lab 07/29/14 2330 07/30/14 0430 07/30/14 0953 07/30/14 1750 07/31/14 0500  NA 132* 135 133* 131* 130*  K 3.4* 3.1* 3.4* 2.9* 2.7*  CL 109 108 109 105 101  CO2 15* 16* 16* 16* 20  GLUCOSE 122* 152* 163* 200* 235*  BUN _1 CREATININE 0.80 0.75 0.67 0.72 0.60  CALCIUM 6.5* 6.5* 6.5* 6.6* 6.7*  MG  --   --   --  1.5  --    GFR Estimated Creatinine Clearance: 79.3 mL/min (by C-G formula based on Cr of 0.6). Liver Function Tests:  Recent Labs Lab 07/24/14 1110 07/28/14 1443 07/29/14 0956 07/30/14 0430 07/31/14 0500  AST 70* 21  36 36 26  ALT 86* 42 36* 35 34  ALKPHOS 120 102 67 55 56  BILITOT 1.67* 1.72* 1.3* 1.4* 1.2  PROT 6.8 5.3* 4.4* 4.2* 4.5*  ALBUMIN 3.3* 2.7* 2.1* 1.9* 1.9*   No results for input(s): LIPASE, AMYLASE in the last 168 hours. No results for input(s): AMMONIA in the last 168 hours. Coagulation profile  Recent Labs Lab 07/24/14 1110 07/24/14 1300 07/28/14 2355 07/29/14 1632 07/30/14 0430 07/31/14 0500  INR 2.90 2.9 6.02* 2.80* 2.44* 1.90*  PROTIME 34.8*  --   --   --   --   --     CBC:  Recent Labs Lab 07/24/14 1110 07/28/14 1443 07/29/14 0956 07/30/14 0430 07/31/14 0500  WBC 2.2* < 0.2* 0.2* 0.1* 0.6*  NEUTROABS 1.1*  --  0.0* 0.1* 0.2*  HGB 14.0 13.5 12.7 10.8* 11.1*  HCT 43.5 38.6 36.5 30.9* 31.4*  MCV 92.2 88.3 88.8 87.3 86.3  PLT 119* 91* 74* 40* 35*   Cardiac Enzymes:  Recent Labs Lab 07/29/14 0626 07/30/14 2030 07/31/14 0500  TROPONINI <0.03 0.12* 0.08*   BNP: Invalid input(s): POCBNP CBG:  Recent Labs Lab 07/30/14 0800 07/30/14 1220 07/30/14 1547 07/30/14 2345 07/31/14 0347  GLUCAP  149* 200* 167* 251* 212*   D-Dimer No results for input(s): DDIMER in the last 72 hours. Hgb A1c No results for input(s): HGBA1C in the last 72 hours. Lipid Profile No results for input(s): CHOL, HDL, LDLCALC, TRIG, CHOLHDL, LDLDIRECT in the last 72 hours. Thyroid function studies  Recent Labs  07/28/14 2355  TSH 0.514   Anemia work up No results for input(s): VITAMINB12, FOLATE, FERRITIN, TIBC, IRON, RETICCTPCT in the last 72 hours. Microbiology Recent Results (from the past 240 hour(s))  TECHNOLOGIST REVIEW     Status: None   Collection Time: 07/28/14  2:43 PM  Result Value Ref Range Status   Technologist Review Occasional neutrophil and lymph seen on scan   Final  Culture, blood (routine x 2)     Status: None (Preliminary result)   Collection Time: 07/28/14 10:55 PM  Result Value Ref Range Status   Specimen Description BLOOD LEFT FOREARM  Final    Special Requests BOTTLES DRAWN AEROBIC ONLY 4CC  Final   Culture   Final    GRAM NEGATIVE RODS Note: Gram Stain Report Called to,Read Back By and Verified With: AMY ARNOLD RN _0  [facility] 07/29/14 Performed at [facility]    Report Status PENDING  Incomplete  Culture, Urine     Status: None   Collection Time: 07/29/14  6:14 AM  Result Value Ref Range Status   Specimen Description URINE, CATHETERIZED  Final   Special Requests NONE  Final   Colony Count NO GROWTH Performed at [facility]   Final   Culture NO GROWTH Performed at [facility]   Final   Report Status 07/30/2014 FINAL  Final  MRSA PCR Screening     Status: None   Collection Time: 07/29/14 10:31 AM  Result Value Ref Range Status   MRSA by PCR NEGATIVE NEGATIVE Final    Comment:        The GeneXpert MRSA Assay (FDA approved for NASAL specimens only), is one component of a comprehensive MRSA colonization surveillance program. It is not intended to diagnose MRSA infection nor to guide or monitor treatment for MRSA infections.       Studies:  US Abdomen Complete  07/29/2014   CLINICAL DATA:  Initial evaluation elevated liver function enzymes, personal history of cholecystectomy, breast cancer, ovarian cancer, currently on chemotherapy  EXAM: ULTRASOUND ABDOMEN COMPLETE  COMPARISON:  None.  FINDINGS: Gallbladder: Surgically absent  Common bile duct: Diameter: 5 mm  Liver: Heterogeneous echotexture. No focal abnormalities. Trace fluid identified around the liver.  IVC: No abnormality visualized.  Pancreas: Not identified  Spleen: Normal with a span of 10 cm  Right Kidney: Length: 12.8 cm. Echogenicity within normal limits. No mass or hydronephrosis visualized.  Left Kidney: Length: 11.1 cm. Echogenicity within normal limits. No mass or hydronephrosis visualized.  Abdominal aorta: Not well seen.  Other findings: None.  IMPRESSION: Hit coarsened heterogeneous hepatic echotexture suggesting a hepatic  parenchymal disease, used which can include steatosis. Left trace fluid seen around the liver.   Electronically Signed   By: Skipper Cliche M.D.   On: 07/29/2014 11:37   Dg Chest Port 1 View  07/31/2014   CLINICAL DATA:  Sepsis  EXAM: PORTABLE CHEST - 1 VIEW  COMPARISON:  07/30/2014  FINDINGS: There is a right jugular central line extending into the low SVC. There is a left jugular Port-A-Cath with tip at the cavoatrial junction. There is unchanged cardiomegaly. There is moderate vascular and interstitial prominence. There is continued  improvement with clearance of basilar opacities. No effusions are evident.  IMPRESSION: Improved, with residual vascular and interstitial prominence.   Electronically Signed   By: Andreas Newport M.D.   On: 07/31/2014 05:18   Dg Chest Port 1 View  07/30/2014   CLINICAL DATA:  Sepsis  EXAM: PORTABLE CHEST - 1 VIEW  COMPARISON:  07/29/2014  FINDINGS: There is a left-sided Port-A-Cath with tip in the cavoatrial junction. There is a right jugular central line with tip in the low SVC. There is moderate cardiomegaly which is unchanged. There is vascular and interstitial prominence, partially cleared since yesterday and there also is clearance of ground-glass opacities in the central lung regions. No large effusions.  IMPRESSION: Improved, with clearance of central ground-glass opacities and with reduction in the degree of vascular/interstitial congestive changes.   Electronically Signed   By: Andreas Newport M.D.   On: 07/30/2014 07:04   Dg Chest Port 1 View  07/29/2014   CLINICAL DATA:  Acute sepsis, heart failure, history of breast cancer  EXAM: PORTABLE CHEST - 1 VIEW  COMPARISON:  07/28/2014  FINDINGS: Left IJ port catheter tip SVC RA junction. New right IJ central line tip lower SVC level. Heart is enlarged with slight increased vascular and interstitial changes concerning for developing edema. No focal pneumonia, collapse or consolidation. No effusion or pneumothorax.  Trachea midline. Postop changes from the right breast surgery and axillary lymph node dissection.  IMPRESSION: Right IJ central line tip lower SVC level.  Mild developing edema pattern.   Electronically Signed   By: Jerilynn Mages.  Shick M.D.   On: 07/29/2014 09:54    Assessment: 59 y.o. [name] woman   (1) status post right mastectomy 06/08/2012 for a pT3, pN1a, stage IIIA invasive ductal carcinoma, grade 2, estrogen and progesterone receptor positive, HER-2/neu negative, with an MIB-1 of 20%.   (2) treated in the adjuvant setting with docetaxel/ cyclophosphamide given every 3 weeks. There were multiple and severe complications, and the patient tolerated only two cycles, last dose 07/29/2012  (3) letrozole started 08/16/2012 (a) osteopenia, with a T score of -1.14 April 2013  (4) postmastectomy radiation completed 12/24/2012  (5) comorbidities include diabetes, hypertension, chronic liver disease with cirrhosis and fatty liver, and nutritional disturbance with temporarily dependence on PEG feeds (discontinued July 2014).  (6) restaging studies January 2016 showed (a) 8 mm and 4 mm Right middle lobe lung nodules, not metabolically active on PET -- will require follow-up (b) hypermetabolic 94.7 cm mixed solid/cystic lesion in upper pelvis, with associated adenopathy  (7) status post exploratory laparotomy 06/27/2014 with total abdominal hysterectomy, bilateral salpingo-oophorectomy, para-aortic lymphadenectomy, omentectomy and radical tumor debulking for a clear cell ovarian cancer, pT1c pN1, stage IIIC .  (8) adjuvant chemotherapy consisting of carboplatin and paclitaxel given weekly days 1 and 8 of each 21 day cycle, for 6-8 cycles as tolerated, started 07/17/2014, with onpro support  (9) gram negative sepsis, febrile neutropenia requiring ICU admission 07/28/2014  (a) blood cultures not yet speciated  (b) severe diarrhea is c difficile  negative  Plan:  [name] is currently day 8 cycle 2 of her weekly carboplatin/ docetaxel treatments. She received Onpro (=neulasta) 07/25/2014. He ANC is beginning to stir (200 today). Hopefully when it fully corrects her infectious problems can turn around more quickly.  I encouraged her to resume po's and told her it was not clear whether we could give her any more chemotherapy (at least not the current chemotherapy). I think this was a relief to her.  Obviously we will not be making any decisions until she fully recovers and sees me in the office as an outpatient.  Greatly appreciate the excellent care the hospitalist service and critical care team are giving this patient!  Chauncey Cruel, MD 07/31/2014  8:09 AM Medical Oncology and Hematology [facility] [address] [place], [place] [address] Tel. [phone]    Fax. [phone]

## 2014-07-31 NOTE — Progress Notes (Signed)
PULMONARY / CRITICAL CARE MEDICINE   Name: [name] MRN: [id number] DOB: 04/11/1956    ADMISSION DATE:  07/28/2014 CONSULTATION DATE:  07/29/14  REFERRING MD :  [facility]  CHIEF COMPLAINT:  shock  INITIAL PRESENTATION: 59 yr old s/p chemo ovarian ca and Breast Ca, shock  STUDIES:  3/19 [name] abdo>>>hepatic parenchymal disease, used which can include steatosis. Left trace fluid seen around the liver.  SIGNIFICANT EVENTS: 3/19- 8 liters given, levo ordered 3/19 - shock, line, pressors, low output 3/20- off pressors, high stool output remains 3/20 Afib(caf on coumadin) RVR amio drip  SUBJECTIVE: increased stool remains  VITAL SIGNS: Temp:  [97.5 F (36.4 C)-101.9 F (38.8 C)] 98 F (36.7 C) (03/21 0800) Pulse Rate:  [39-250] 159 (03/21 1000) Resp:  [19-35] 27 (03/21 1000) BP: (81-121)/(50-94) 110/71 mmHg (03/21 1000) SpO2:  [93 %-98 %] 96 % (03/21 1000) Arterial Line BP: (82-173)/(48-92) 108/61 mmHg (03/21 1000) HEMODYNAMICS:   VENTILATOR SETTINGS:   INTAKE / OUTPUT:  Intake/Output Summary (Last 24 hours) at 07/31/14 1041 Last data filed at 07/31/14 1000  Gross per 24 hour  Intake 4783.4 ml  Output   3400 ml  Net 1383.4 ml    PHYSICAL EXAMINATION: General:  Awake, mild anxiety , nonfocal Neuro:  Moves upper ext equal , perrl, intact Head: [place]/AT Eyes: PERRL, EOM-I and MMM. Neck: line rt ij clean Cardiovascular: afib rvr hr 150 Lungs:  Mild crackels bases, CTA anterior port clean Abdomen:  Soft, scar healed, no r/g, NT, rectal tube with fluid loss 1 litre daily Musculoskeletal:  No pain, no edema Skin:  No rash  LABS:  CBC  Recent Labs Lab 07/29/14 0956 07/30/14 0430 07/31/14 0500  WBC 0.2* 0.1* 0.6*  HGB 12.7 10.8* 11.1*  HCT 36.5 30.9* 31.4*  PLT 74* 40* 35*   Coag's  Recent Labs Lab 07/29/14 1632 07/30/14 0430 07/31/14 0500  APTT  --  35 31  INR 2.80* 2.44* 1.90*   BMET  Recent Labs Lab 07/30/14 0953 07/30/14 1750 07/31/14 0500   NA 133* 131* 130*  K 3.4* 2.9* 2.7*  CL 109 105 101  CO2 16* 16* 20  BUN 17 15 12   CREATININE 0.67 0.72 0.60  GLUCOSE 163* 200* 235*   Electrolytes  Recent Labs Lab 07/30/14 0953 07/30/14 1750 07/31/14 0500  CALCIUM 6.5* 6.6* 6.7*  MG  --  1.5  --    Sepsis Markers  Recent Labs Lab 07/28/14 2355 07/29/14 0626 07/29/14 1318  LATICACIDVEN 5.8* 7.7* 6.0*   ABG  Recent Labs Lab 07/29/14 0824 07/30/14 0420  PHART 7.348* 7.484*  PCO2ART 21.6* 20.5*  PO2ART 81.0 96.8   Liver Enzymes  Recent Labs Lab 07/29/14 0956 07/30/14 0430 07/31/14 0500  AST 36 36 26  ALT 36* 35 34  ALKPHOS 67 55 56  BILITOT 1.3* 1.4* 1.2  ALBUMIN 2.1* 1.9* 1.9*   Cardiac Enzymes  Recent Labs Lab 07/30/14 2030 07/31/14 0500 07/31/14 0814  TROPONINI 0.12* 0.08* 0.06*   Glucose  Recent Labs Lab 07/30/14 0800 07/30/14 1220 07/30/14 1547 07/30/14 2345 07/31/14 0347 07/31/14 0747  GLUCAP 149* 200* 167* 251* 212* 199*    Imaging Dg Chest Port 1 View  07/30/2014   CLINICAL DATA:  Sepsis  EXAM: PORTABLE CHEST - 1 VIEW  COMPARISON:  07/29/2014  FINDINGS: There is a left-sided Port-A-Cath with tip in the cavoatrial junction. There is a right jugular central line with tip in the low SVC. There is moderate cardiomegaly which is unchanged.  There is vascular and interstitial prominence, partially cleared since yesterday and there also is clearance of ground-glass opacities in the central lung regions. No large effusions.  IMPRESSION: Improved, with clearance of central ground-glass opacities and with reduction in the degree of vascular/interstitial congestive changes.   Electronically Signed   By: Andreas Newport M.D.   On: 07/30/2014 07:04     ASSESSMENT / PLAN:  PULMONARY A: Stable P:   pcxr improved Is  CARDIOVASCULAR CVL 3/19>>> A:  Shock (septic), hypovolemia from GI loss Afib with RVR P:  Keep pos balance Chem in am  MAP off pressors Amio drip, BB Consider card  consult followed by Nasher  RENAL A:  NONAG from loose stools, high risk ATN, ARF, hypovolemia, hypoK P:    bicarb drip for active NON AG losses Fluid repletion IV k   GASTROINTESTINAL A:  Diarrhea chemo induced, Unlikely primary colitis / gastroenteritis, r/o cirhosis P:   [name] unimpressive Bili repeat Start clears ppi IVF for insensible losses  HEMATOLOGIC A:  Neutropenia and pancytopenia secondary to chemo /sepsis, coagulapathic from coumadin , sepsis P:  Consider neupogen x 3 days then dc Cbc  scd Vit k  No role ffp INR 1.9 3/21 Fibrinogen x 1  INFECTIOUS A:   neutopenic septic shock, r/o (favor) GI source (translocation), r/o line Check c dif for completeness 3/21 AND REPLETE LOSSES  ? Need for probiotics and antidiarrheals 3/21   P:   BCx 3/19>>>2/2 gram neg rod>>>Klebsiella oxy ss cefepime and cipro UC 3/29>>>neg 3/21 C dif >> Abx:  Cefepime 3/19>>> cipro 3/19>>> vanc 3/19>>>3/20 Flagyl 3/19>>>3/20 myco 3/19>>>3/19  Dc flagyl, vanc Follow ID gram neg rod kLEB OXY  ENDOCRINE A:  R/o rel AI  P:   Cortisol 150 3/21 dc steroids cbg  NEUROLOGIC A:  Encephalaopthy septic - resolving, follows commands P:   RASS goal: 0   FAMILY  - Updates: husband daily  - Inter-disciplinary family meet or Palliative Care meeting due by: 3/26   Richardson Landry Minor ACNP Maryanna Shape PCCM Pager [phone] till 3 pm If no answer page [phone]  New severe a-fib with RVR.  Persistent diarrhea.  Patient is encephalopathic this AM.  Will d/c steroids.  Check C diff, continue abx as above.  Titrate O2 down as able.  If C. Diff is negative then will start imodium to control diarrhea.  HR control via amiodarone.  Would hold in the SDU today until above new problems are addressed.  I reviewed the CXR myself and evidence of mild pulmonary edema noted.  Case discussed with NP and plan addressed as above.  Patient seen and examined, agree with above note.  I dictated the care and orders  written for this patient under my direction.  Rush Farmer, MD [phone]  07/31/2014, 10:41 AM

## 2014-07-31 NOTE — Care Management Note (Signed)
CARE MANAGEMENT NOTE 07/31/2014  Patient:  [name], [name]   Account Number:  [id number]  Date Initiated:  07/31/2014  Documentation initiated by:  DAVIS,RHONDA  Subjective/Objective Assessment:   cpna,sepsis,a.fib with rvr, hypotensive requiring hemodynamic monitoring,/58 y.o. female history of ovarian cancer and breast cancer, diabetes mellitus type 2, chronic a. fibrillation on Coumadin, CHF, hypertension was referred to the Ed     Action/Plan:   home when stable   Anticipated DC Date:  08/03/2014   Anticipated DC Plan:  HOME/SELF CARE  In-house referral  NA      DC Planning Services  CM consult      PAC Choice  NA   Choice offered to / List presented to:  NA           Status of service:  In process, will continue to follow Medicare Important Message given?   (If response is "NO", the following Medicare IM given date fields will be blank) Date Medicare IM given:   Medicare IM given by:   Date Additional Medicare IM given:   Additional Medicare IM given by:    Discharge Disposition:    Per UR Regulation:  Reviewed for med. necessity/level of care/duration of stay  If discussed at [place] of Stay Meetings, dates discussed:    Comments:  July 31, 2014/Rhonda L. Rosana Hoes, RN, BSN, CCM. Case Management [place] [phone] No discharge needs present of time of review.

## 2014-07-31 NOTE — Progress Notes (Signed)
Inpatient Diabetes Program Recommendations  AACE/ADA: New Consensus Statement on Inpatient Glycemic Control (2013)  Target Ranges:  Prepandial:   less than 140 mg/dL      Peak postprandial:   less than 180 mg/dL (1-2 hours)      Critically ill patients:  140 - 180 mg/dL   Reason for Assessment: Results for HOLLAN, PHILIPP (MRN [id number]) as of 07/31/2014 08:25  Ref. Range 07/30/2014 12:20 07/30/2014 15:47 07/30/2014 23:45 07/31/2014 03:47 07/31/2014 07:47  Glucose-Capillary Latest Range: 70-99 mg/dL 200 (H) 167 (H) 251 (H) 212 (H) 199 (H)   Diabetes history: Type 2 diabetes Outpatient Diabetes medications: Metformin Current orders for Inpatient glycemic control:  Novolog sensitive q 4 hours.  May consider increasing correction scale to moderate q 4 hours while on steroids.  Thanks, Adah Perl, RN, BC-ADM Inpatient Diabetes Coordinator Pager [phone] (8a-5p)

## 2014-08-01 ENCOUNTER — Inpatient Hospital Stay (HOSPITAL_COMMUNITY): Payer: BLUE CROSS/BLUE SHIELD

## 2014-08-01 ENCOUNTER — Other Ambulatory Visit: Payer: Self-pay | Admitting: *Deleted

## 2014-08-01 LAB — CBC
HCT: 30.1 % — ABNORMAL LOW (ref 36.0–46.0)
Hemoglobin: 10.6 g/dL — ABNORMAL LOW (ref 12.0–15.0)
MCH: 30.6 pg (ref 26.0–34.0)
MCHC: 35.2 g/dL (ref 30.0–36.0)
MCV: 87 fL (ref 78.0–100.0)
Platelets: 35 10*3/uL — ABNORMAL LOW (ref 150–400)
RBC: 3.46 MIL/uL — AB (ref 3.87–5.11)
RDW: 14.3 % (ref 11.5–15.5)
WBC: 2 10*3/uL — AB (ref 4.0–10.5)

## 2014-08-01 LAB — GLUCOSE, CAPILLARY
GLUCOSE-CAPILLARY: 179 mg/dL — AB (ref 70–99)
GLUCOSE-CAPILLARY: 141 mg/dL — AB (ref 70–99)
Glucose-Capillary: 168 mg/dL — ABNORMAL HIGH (ref 70–99)
Glucose-Capillary: 195 mg/dL — ABNORMAL HIGH (ref 70–99)
Glucose-Capillary: 156 mg/dL — ABNORMAL HIGH (ref 70–99)

## 2014-08-01 LAB — TROPONIN I
TROPONIN I: 0.04 ng/mL — AB (ref <0.031)
Troponin I: 0.04 ng/mL — ABNORMAL HIGH (ref <0.031)
Troponin I: 0.04 ng/mL — ABNORMAL HIGH (ref <0.031)

## 2014-08-01 LAB — BASIC METABOLIC PANEL
Anion gap: 10 (ref 5–15)
Anion gap: 8 (ref 5–15)
BUN: 14 mg/dL (ref 6–23)
BUN: 12 mg/dL (ref 6–23)
CHLORIDE: 98 mmol/L (ref 96–112)
CO2: 25 mmol/L (ref 19–32)
CO2: 26 mmol/L (ref 19–32)
CREATININE: 0.58 mg/dL (ref 0.50–1.10)
CREATININE: 0.46 mg/dL — AB (ref 0.50–1.10)
Calcium: 6.6 mg/dL — ABNORMAL LOW (ref 8.4–10.5)
Calcium: 6.4 mg/dL — CL (ref 8.4–10.5)
Chloride: 97 mmol/L (ref 96–112)
GFR calc Af Amer: >90 mL/min (ref >90)
GFR calc Af Amer: >90 mL/min (ref >90)
GFR calc non Af Amer: >90 mL/min (ref >90)
GLUCOSE: 179 mg/dL — AB (ref 70–99)
Glucose, Bld: 170 mg/dL — ABNORMAL HIGH (ref 70–99)
POTASSIUM: 2.7 mmol/L — AB (ref 3.5–5.1)
POTASSIUM: 3 mmol/L — AB (ref 3.5–5.1)
Sodium: 132 mmol/L — ABNORMAL LOW (ref 135–145)
Sodium: 132 mmol/L — ABNORMAL LOW (ref 135–145)

## 2014-08-01 LAB — CULTURE, BLOOD (SINGLE)

## 2014-08-01 LAB — APTT: aPTT: 35 seconds (ref 24–37)

## 2014-08-01 LAB — PROTIME-INR
INR: 1.63 — ABNORMAL HIGH (ref 0.00–1.49)
PROTHROMBIN TIME: 19.5 s — AB (ref 11.6–15.2)

## 2014-08-01 LAB — MAGNESIUM: Magnesium: 2.1 mg/dL (ref 1.5–2.5)

## 2014-08-01 LAB — PHOSPHORUS

## 2014-08-01 MED ORDER — MIDAZOLAM HCL 2 MG/2ML IJ SOLN
0.5000 mg | INTRAMUSCULAR | Status: DC | PRN
  Administered 2014-08-01: 0.5 mg via INTRAVENOUS
  Filled 2014-08-01: qty 2

## 2014-08-01 MED ORDER — LOPERAMIDE HCL 2 MG PO CAPS
2.0000 mg | ORAL_CAPSULE | ORAL | Status: AC | PRN
  Administered 2014-08-02 – 2014-08-04 (×4): 2 mg via ORAL
  Filled 2014-08-01 (×4): qty 1

## 2014-08-01 MED ORDER — FENTANYL CITRATE 0.05 MG/ML IJ SOLN
25.0000 ug | INTRAMUSCULAR | Status: DC | PRN
  Administered 2014-08-02 (×4): 25 ug via INTRAVENOUS
  Filled 2014-08-01 (×4): qty 2

## 2014-08-01 MED ORDER — KCL IN DEXTROSE-NACL 20-5-0.9 MEQ/L-%-% IV SOLN
INTRAVENOUS | Status: DC
  Administered 2014-08-01 – 2014-08-03 (×5): via INTRAVENOUS
  Filled 2014-08-01 (×5): qty 1000

## 2014-08-01 MED ORDER — POTASSIUM PHOSPHATES 15 MMOLE/5ML IV SOLN
30.0000 mmol | Freq: Once | INTRAVENOUS | Status: AC
  Administered 2014-08-01: 30 mmol via INTRAVENOUS
  Filled 2014-08-01: qty 10

## 2014-08-01 MED ORDER — AMIODARONE LOAD VIA INFUSION
150.0000 mg | Freq: Once | INTRAVENOUS | Status: AC
  Administered 2014-08-01: 150 mg via INTRAVENOUS
  Filled 2014-08-01: qty 83.34

## 2014-08-01 MED ORDER — SODIUM CHLORIDE 0.9 % IV BOLUS (SEPSIS)
1000.0000 mL | Freq: Once | INTRAVENOUS | Status: AC
  Administered 2014-08-01: 1000 mL via INTRAVENOUS

## 2014-08-01 MED ORDER — FAMOTIDINE IN NACL 20-0.9 MG/50ML-% IV SOLN
20.0000 mg | INTRAVENOUS | Status: DC
  Administered 2014-08-01 – 2014-08-03 (×3): 20 mg via INTRAVENOUS
  Filled 2014-08-01 (×3): qty 50

## 2014-08-01 MED ORDER — ACETAMINOPHEN 325 MG PO TABS
650.0000 mg | ORAL_TABLET | ORAL | Status: DC | PRN
  Administered 2014-08-01 – 2014-08-04 (×2): 650 mg via ORAL
  Filled 2014-08-01 (×2): qty 2

## 2014-08-01 MED ORDER — CEFTRIAXONE SODIUM IN DEXTROSE 40 MG/ML IV SOLN
2.0000 g | INTRAVENOUS | Status: DC
  Administered 2014-08-01 – 2014-08-08 (×8): 2 g via INTRAVENOUS
  Filled 2014-08-01 (×9): qty 50

## 2014-08-01 MED ORDER — POTASSIUM CHLORIDE CRYS ER 20 MEQ PO TBCR
40.0000 meq | EXTENDED_RELEASE_TABLET | Freq: Once | ORAL | Status: AC
  Administered 2014-08-01: 40 meq via ORAL
  Filled 2014-08-01: qty 2

## 2014-08-01 MED ORDER — METOPROLOL TARTRATE 25 MG PO TABS: 25.0000 mg | ORAL_TABLET | Freq: Three times a day (TID) | ORAL | Status: DC

## 2014-08-01 NOTE — Progress Notes (Signed)
eLink Physician-Brief Progress Note Patient Name: [name] DOB: [birth year] MRN: [id number]   Date of Service  08/01/2014  HPI/Events of Note  Hypokalemia and hypophosphatemia  eICU Interventions  Potassium and phos replaced     Intervention Category Intermediate Interventions: Electrolyte abnormality - evaluation and management  [name] 08/01/2014, 5:41 AM

## 2014-08-01 NOTE — Progress Notes (Signed)
CRITICAL VALUE ALERT  Critical value received:  Calcium 6.4  Date of notification:  08/01/2014  Time of notification:  3500  Critical value read back:Yes.    Nurse who received alert:  Idelle Crouch, RN  MD notified (1st page): ELINK/Feinstein  Time of first page: 1800  MD notified (2nd page):   Time of second page:  Responding MD:  ELINK/Feinstein  Time MD responded:  1816

## 2014-08-01 NOTE — Consult Note (Addendum)
CARDIOLOGY CONSULT NOTE   Patient ID: [name] MRN: [id number], DOB/AGE: 08/31/1955   Admit date: 07/28/2014 Date of Consult: 08/01/2014   Primary Physician: [name], NP Primary Cardiologist: Dr. Mertie Moores  Pt. Profile  59 year old woman admitted with neutropenic sepsis.  She has a history of chronic atrial fibrillation.  Problem List  Past Medical History  Diagnosis Date  . Depression   . DVT (deep venous thrombosis)     hx of on HRT left leg ~2006  . GERD (gastroesophageal reflux disease)   . Hyperlipidemia   . Hypertension   . Hypothyroidism   . PPD positive, treated     rx inh   . Diabetes mellitus   . 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 DJD (degenerative joint disease) of lumbar spine   . Chronic diastolic CHF (congestive heart failure)     a. 08/2012 Echo: EF 55-60%, no rwma, mod MR.  [name] A-fib     a. on amio for rate control/coumadin.  [name] Physical deconditioning   . Allergy   . Anemia     low iron hx  . Blood transfusion without reported diagnosis     2 u prbc  . Cataract     removed ou  . OSA on CPAP     cpap 4.5 setting  . Breast cancer 2014    a. 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 Tubal ligation    . Cholecystectomy    . Foot surgery    . Eye surgery    . Cataract extraction    . Percutaneous liver biopsy    . Breast biopsy      left breast  . Mastectomy modified radical  06/08/2012    Procedure: MASTECTOMY MODIFIED RADICAL;  Surgeon: Edward Jolly, MD;  Location: [place];  Service: General;  Laterality: Right;  . Portacath placement  06/08/2012    Procedure: INSERTION PORT-A-CATH;  Surgeon: Edward Jolly, MD;  Location: [place];  Service: General;  Laterality: Left;  . Peg tube placement      peg removed 2015  . History of  chemotherapy x 2 treatments, radiation tx  2014  . Pac removed  2015  . Laparotomy N/A 06/27/2014    Procedure: EXPLORATORY LAPAROTOMY;  Surgeon: Everitt Amber, MD;  Location: [facility];  Service: Gynecology;  Laterality: N/A;  . Abdominal hysterectomy N/A 06/27/2014    Procedure: HYSTERECTOMY ABDOMINAL TOTAL;  Surgeon: Everitt Amber, MD;  Location: [facility];  Service: Gynecology;  Laterality: N/A;  . Salpingoophorectomy Bilateral 06/27/2014    Procedure: BILATERAL SALPINGO OOPHORECTOMY/TUMOR DEBULKING/LYMPHNODE DISSECTION, OMENTECTOMY;  Surgeon: Everitt Amber, MD;  Location: [facility];  Service: Gynecology;  Laterality: Bilateral;     Allergies  Allergies  Allergen Reactions  . Olmesartan Medoxomil Cough    REACTION: ? if cough  . Tetracycline Hcl     Unknown reaction, too long for patient to remember   . Venlafaxine     REACTION: severe dry moouth  . Adhesive [Tape] Rash    HPI   This 59 year old woman was admitted on 07/28/14 for sepsis.  She has a history of remote breast cancer and recent ovarian cancer.  She underwent a hysterectomy several weeks ago.  She has been on long-term warfarin for a history of atrial fibrillation.  She is presently off warfarin because of supratherapeutic levels and because of a platelet count of 35,000.  She has had problems with persistently elevated ventricular response to her atrial fibrillation.  She currently is on an amiodarone drip and on low dose metoprolol 25 mg twice a day.  She is not having any chest pain or shortness of breath and her oxygen levels on room air are in the mid 90s.  She is continuing to have problems with diarrhea and is requiring large volumes of fluid replacement.  Her last EKG on 07/28/14 showed sinus tachycardia with frequent PACs.  A repeat EKG is pending.  Her telemetry now shows atrial fibrillation with a ventricular response of 130.  Her last echocardiogram on 08/25/12 showed mild LVH with an ejection fraction of 55-60% and there was moderate  mitral regurgitation.  Inpatient Medications  . cefTRIAXone (ROCEPHIN)  IV  2 g Intravenous Q24H  . famotidine (PEPCID) IV  20 mg Intravenous Q24H  . insulin aspart  0-9 Units Subcutaneous 6 times per day  . letrozole  2.5 mg Oral Daily  . levothyroxine  150 mcg Oral QAC breakfast  . metoprolol tartrate  25 mg Oral BID  . potassium phosphate IVPB (mmol)  30 mmol Intravenous Once  . sodium chloride  10-40 mL Intracatheter Q12H    Family History Family History  Problem Relation Age of Onset  . Diabetes Mother   . Hypertension Mother   . Arthritis Mother   . Heart disease Mother   . Heart failure Mother   . Other Mother     benign breast mass  . Stroke Father   . Heart disease Father   . Diabetes Paternal Grandmother   . Colon cancer Paternal Grandfather      Social History History   Social History  . Marital Status: Married    Spouse Name: N/A  . Number of Children: 2  . Years of Education: N/A   Occupational History  . [place] History Main Topics  . Smoking status: Former Smoker -- 1.00 packs/day for 1 years  . Smokeless tobacco: Never Used  . Alcohol Use: No  . Drug Use: No     Comment: quit age 1 only smoked as a teen 1 year  . Sexual Activity: No   Other Topics Concern  . Not on file   Social History Narrative   Married   Works at [employer] secretary   Daily caffeine use - 2 cups a day plus a couple of sodas a day   Pt doesn't exercise regularly   G2P2   H H of 5 soon to be 6 .      Review of Systems  General:  No chills, fever, night sweats or weight changes.  Cardiovascular:  No chest pain, dyspnea on exertion, edema, orthopnea, palpitations, paroxysmal nocturnal dyspnea. Dermatological: No rash, lesions/masses Respiratory: No cough, dyspnea Urologic: No hematuria, dysuria Abdominal:   No nausea, vomiting, , bright red blood per rectum, melena, or hematemesis.  Positive for diarrhea and for recent abdominal  surgery for hysterectomy Neurologic:  No visual changes, wkns, changes in mental status. All other systems reviewed and are otherwise negative except as noted above.  Physical Exam  Blood pressure 104/61, pulse 141, temperature 98.7 F (37.1 C), temperature source Oral, resp. rate 31, height 5\' 5"  (1.651 m), weight 194 lb 14.2 oz (88.4 kg), SpO2 95 %.  General: Pleasant, NAD Psych: Normal affect. Neuro: Alert and  oriented X 3. Moves all extremities spontaneously. HEENT: Normal  Neck: Supple without bruits or JVD. Lungs:  Resp regular and unlabored, mild basilar rales Heart: Rapid irregular heart rate.  No murmur gallop or rub. Abdomen: Bowel sounds are soft.  Recent incision appears to be healing well following hysterectomy Extremities: No clubbing, cyanosis or edema. DP/PT/Radials 2+ and equal bilaterally.  Labs   Recent Labs  07/30/14 2030 07/31/14 0500 07/31/14 0814  TROPONINI 0.12* 0.08* 0.06*   Lab Results  Component Value Date   WBC 2.0* 08/01/2014   HGB 10.6* 08/01/2014   HCT 30.1* 08/01/2014   MCV 87.0 08/01/2014   PLT 35* 08/01/2014    Recent Labs Lab 07/31/14 0500  08/01/14 0434  NA 130*  < > 132*  K 2.7*  < > 2.7*  CL 101  < > 97  CO2 20  < > 25  BUN 12  < > 14  CREATININE 0.60  < > 0.58  CALCIUM 6.7*  < > 6.6*  PROT 4.5*  --   --   BILITOT 1.2  --   --   ALKPHOS 56  --   --   ALT 34  --   --   AST 26  --   --   GLUCOSE 235*  < > 179*  < > = values in this interval not displayed. Lab Results  Component Value Date   CHOL 196 07/25/2013   HDL 34* 07/25/2013   LDLCALC 136* 07/25/2013   TRIG 131 07/25/2013   No results found for: DDIMER  Radiology/Studies  US Abdomen Complete  07/29/2014   CLINICAL DATA:  Initial evaluation elevated liver function enzymes, personal history of cholecystectomy, breast cancer, ovarian cancer, currently on chemotherapy  EXAM: ULTRASOUND ABDOMEN COMPLETE  COMPARISON:  None.  FINDINGS: Gallbladder: Surgically absent   Common bile duct: Diameter: 5 mm  Liver: Heterogeneous echotexture. No focal abnormalities. Trace fluid identified around the liver.  IVC: No abnormality visualized.  Pancreas: Not identified  Spleen: Normal with a span of 10 cm  Right Kidney: Length: 12.8 cm. Echogenicity within normal limits. No mass or hydronephrosis visualized.  Left Kidney: Length: 11.1 cm. Echogenicity within normal limits. No mass or hydronephrosis visualized.  Abdominal aorta: Not well seen.  Other findings: None.  IMPRESSION: Hit coarsened heterogeneous hepatic echotexture suggesting a hepatic parenchymal disease, used which can include steatosis. Left trace fluid seen around the liver.   Electronically Signed   By: Skipper Cliche M.D.   On: 07/29/2014 11:37   Ir Fluoro Guide Cv Line Left  07/11/2014   CLINICAL DATA:  PRIOR HISTORY BREAST CANCER, NEW DIAGNOSIS OF OVARIAN CANCER.  EXAM: LEFT INTERNAL JUGULAR SINGLE LUMEN POWER PORT CATHETER INSERTION  Date:  3/1/20163/05/2014 10:20 am  Radiologist:  M. Daryll Brod, MD  Guidance:  ULTRASOUND AND FLUOROSCOPIC  FLUOROSCOPY TIME:  1 MINUTES 12 SECONDS  MEDICATIONS AND MEDICAL HISTORY: 2 g Ancefadministered within 1 hour of the procedure.Versed and fentanyl for conscious sedation  ANESTHESIA/SEDATION: 40 minutes  CONTRAST:  None.  COMPLICATIONS: None immediate  PROCEDURE: Informed consent was obtained from the patient following explanation of the procedure, risks, benefits and alternatives. The patient understands, agrees and consents for the procedure. All questions were addressed. A time out was performed.  Maximal barrier sterile technique utilized including caps, mask, sterile gowns, sterile gloves, large sterile drape, hand hygiene, and 2% chlorhexidine scrub.  Under sterile conditions and local anesthesia, right internal jugular micropuncture venous access was performed. Access was performed  with ultrasound. Images were obtained for documentation. A guide wire was inserted followed by a  transitional dilator. This allowed insertion of a guide wire and catheter into the IVC. Measurements were obtained from the SVC / RA junction back to the right IJ venotomy site. In the right infraclavicular chest, a subcutaneous pocket was created over the second anterior rib. This was done under sterile conditions and local anesthesia. 1% lidocaine with epinephrine was utilized for this. A 2.5 cm incision was made in the skin. Blunt dissection was performed to create a subcutaneous pocket over the right pectoralis major muscle. The pocket was flushed with saline vigorously. There was adequate hemostasis. The port catheter was assembled and checked for leakage. The port catheter was secured in the pocket with two retention sutures. The tubing was tunneled subcutaneously to the right venotomy site and inserted into the SVC/RA junction through a valved peel-away sheath. Position was confirmed with fluoroscopy. Images were obtained for documentation. The patient tolerated the procedure well. No immediate complications. Incisions were closed in a two layer fashion with 4 - 0 Vicryl suture. Dermabond was applied to the skin. The port catheter was accessed, blood was aspirated followed by saline and heparin flushes. Needle was removed. A dry sterile dressing was applied.  IMPRESSION: Ultrasound and fluoroscopically guided right internal jugular single lumen power port catheter insertion. Tip in the SVC/RA junction. Catheter ready for use.   Electronically Signed   By: Jerilynn Mages.  Shick M.D.   On: 07/11/2014 10:31   Ir US Guide Vasc Access Left  07/11/2014   CLINICAL DATA:  PRIOR HISTORY BREAST CANCER, NEW DIAGNOSIS OF OVARIAN CANCER.  EXAM: LEFT INTERNAL JUGULAR SINGLE LUMEN POWER PORT CATHETER INSERTION  Date:  3/1/20163/05/2014 10:20 am  Radiologist:  M. Daryll Brod, MD  Guidance:  ULTRASOUND AND FLUOROSCOPIC  FLUOROSCOPY TIME:  1 MINUTES 12 SECONDS  MEDICATIONS AND MEDICAL HISTORY: 2 g Ancefadministered within 1 hour of the  procedure.Versed and fentanyl for conscious sedation  ANESTHESIA/SEDATION: 40 minutes  CONTRAST:  None.  COMPLICATIONS: None immediate  PROCEDURE: Informed consent was obtained from the patient following explanation of the procedure, risks, benefits and alternatives. The patient understands, agrees and consents for the procedure. All questions were addressed. A time out was performed.  Maximal barrier sterile technique utilized including caps, mask, sterile gowns, sterile gloves, large sterile drape, hand hygiene, and 2% chlorhexidine scrub.  Under sterile conditions and local anesthesia, right internal jugular micropuncture venous access was performed. Access was performed with ultrasound. Images were obtained for documentation. A guide wire was inserted followed by a transitional dilator. This allowed insertion of a guide wire and catheter into the IVC. Measurements were obtained from the SVC / RA junction back to the right IJ venotomy site. In the right infraclavicular chest, a subcutaneous pocket was created over the second anterior rib. This was done under sterile conditions and local anesthesia. 1% lidocaine with epinephrine was utilized for this. A 2.5 cm incision was made in the skin. Blunt dissection was performed to create a subcutaneous pocket over the right pectoralis major muscle. The pocket was flushed with saline vigorously. There was adequate hemostasis. The port catheter was assembled and checked for leakage. The port catheter was secured in the pocket with two retention sutures. The tubing was tunneled subcutaneously to the right venotomy site and inserted into the SVC/RA junction through a valved peel-away sheath. Position was confirmed with fluoroscopy. Images were obtained for documentation. The patient tolerated the procedure well. No immediate complications.  Incisions were closed in a two layer fashion with 4 - 0 Vicryl suture. Dermabond was applied to the skin. The port catheter was accessed,  blood was aspirated followed by saline and heparin flushes. Needle was removed. A dry sterile dressing was applied.  IMPRESSION: Ultrasound and fluoroscopically guided right internal jugular single lumen power port catheter insertion. Tip in the SVC/RA junction. Catheter ready for use.   Electronically Signed   By: Jerilynn Mages.  Shick M.D.   On: 07/11/2014 10:31   Dg Chest Port 1 View  08/01/2014   CLINICAL DATA:  Respiratory failure  EXAM: PORTABLE CHEST - 1 VIEW  COMPARISON:  07/31/2014  FINDINGS: Right jugular central line extends into the low SVC. Left jugular Port-A-Cath extends into the cavoatrial junction. There is moderate cardiomegaly, unchanged. There is moderate vascular and interstitial congestion which is worsened from the previous day. There now are mild ground-glass opacities in the central and basilar regions which may represent developing alveolar edema.  IMPRESSION: Congestive heart failure with mild worsening   Electronically Signed   By: Andreas Newport M.D.   On: 08/01/2014 07:01   Dg Chest Port 1 View  07/31/2014   CLINICAL DATA:  Sepsis  EXAM: PORTABLE CHEST - 1 VIEW  COMPARISON:  07/30/2014  FINDINGS: There is a right jugular central line extending into the low SVC. There is a left jugular Port-A-Cath with tip at the cavoatrial junction. There is unchanged cardiomegaly. There is moderate vascular and interstitial prominence. There is continued improvement with clearance of basilar opacities. No effusions are evident.  IMPRESSION: Improved, with residual vascular and interstitial prominence.   Electronically Signed   By: Andreas Newport M.D.   On: 07/31/2014 05:18   Dg Chest Port 1 View  07/30/2014   CLINICAL DATA:  Sepsis  EXAM: PORTABLE CHEST - 1 VIEW  COMPARISON:  07/29/2014  FINDINGS: There is a left-sided Port-A-Cath with tip in the cavoatrial junction. There is a right jugular central line with tip in the low SVC. There is moderate cardiomegaly which is unchanged. There is vascular  and interstitial prominence, partially cleared since yesterday and there also is clearance of ground-glass opacities in the central lung regions. No large effusions.  IMPRESSION: Improved, with clearance of central ground-glass opacities and with reduction in the degree of vascular/interstitial congestive changes.   Electronically Signed   By: Andreas Newport M.D.   On: 07/30/2014 07:04   Dg Chest Port 1 View  07/29/2014   CLINICAL DATA:  Acute sepsis, heart failure, history of breast cancer  EXAM: PORTABLE CHEST - 1 VIEW  COMPARISON:  07/28/2014  FINDINGS: Left IJ port catheter tip SVC RA junction. New right IJ central line tip lower SVC level. Heart is enlarged with slight increased vascular and interstitial changes concerning for developing edema. No focal pneumonia, collapse or consolidation. No effusion or pneumothorax. Trachea midline. Postop changes from the right breast surgery and axillary lymph node dissection.  IMPRESSION: Right IJ central line tip lower SVC level.  Mild developing edema pattern.   Electronically Signed   By: Jerilynn Mages.  Shick M.D.   On: 07/29/2014 09:54   Dg Chest Port 1 View  07/29/2014   CLINICAL DATA:  Fever and diarrhea for 2 days.  EXAM: PORTABLE CHEST - 1 VIEW  COMPARISON:  04/26/2014  FINDINGS: There is a left-sided Port-A-Cath with tip in the cavoatrial junction. There is generalized interstitial fluid or thickening which is increased from 04/26/2014. There is no airspace opacity. There is no large effusion.  IMPRESSION: Interstitial fluid or infiltrate, new from 04/26/2014.   Electronically Signed   By: Andreas Newport M.D.   On: 07/29/2014 00:32    ECG  Repeat EKG pending  ASSESSMENT AND PLAN  1.  History of paroxysmal atrial fibrillation, currently with rapid ventricular response.  INR is 1.63.  Warfarin is currently on hold because of marked thrombocytopenia. 2.  Recent neutropenic sepsis. 3.  Remote history of breast cancer, history of recent ovarian cancer. 4.   Diarrhea 5.  Mildly elevated troponins probably secondary to supply demand mismatch secondary to prolonged tachycardia.  No evidence of acute coronary syndrome. Plan: We will update her echocardiogram.  Update EKG.  Continue IV amiodarone.  Blood pressure is soft.  She is currently on low-dose metoprolol 25 mg twice a day.  We will try increasing to 25 mg every 8 hours to slow rate further.  Consider addition of diltiazem if rate remains high but her blood pressure may not tolerate it.  Consider digoxin. Will follow with you Signed, Darlin Coco, MD  08/01/2014, 11:52 AM

## 2014-08-01 NOTE — Progress Notes (Signed)
eLink Physician-Brief Progress Note Patient Name: [name] DOB: 1956-01-04 MRN: [id number]   Date of Service  08/01/2014  HPI/Events of Note  Low borderline bP, dc BB Slight rvr still  Bolus amio Ancxiety, not taking xanax  eICU Interventions  Low low dose versed        [name]. 08/01/2014, 10:48 PM

## 2014-08-01 NOTE — Procedures (Signed)
Arterial Catheter Insertion Procedure Note [name] [id number] November 14, 1955  Procedure: Insertion of Arterial Catheter  Indications: Blood pressure monitoring  Procedure Details Consent: Risks of procedure as well as the alternatives and risks of each were explained to the (patient/caregiver).  Consent for procedure obtained. Time Out: Verified patient identification, verified procedure, site/side was marked, verified correct patient position, special equipment/implants available, medications/allergies/relevent history reviewed, required imaging and test results available.  Performed  Maximum sterile technique was used including antiseptics, cap, gloves, gown, hand hygiene, mask and sheet. Skin prep: Chlorhexidine; local anesthetic administered 22 gauge catheter was inserted into left radial artery using the Seldinger technique.  Evaluation Blood flow good; BP tracing good. Complications: No apparent complications.   Leta Baptist 08/01/2014

## 2014-08-01 NOTE — Progress Notes (Signed)
ANTIBIOTIC CONSULT NOTE - INITIAL  Pharmacy Consult for Ceftriaxone Indication: Klebsiella bacteremia  Allergies  Allergen Reactions  . Olmesartan Medoxomil Cough    REACTION: ? if cough  . Tetracycline Hcl     Unknown reaction, too long for patient to remember   . Venlafaxine     REACTION: severe dry moouth  . Adhesive [Tape] Rash    Patient Measurements: Height: 5\' 5"  (165.1 cm) Weight: 194 lb 14.2 oz (88.4 kg) IBW/kg (Calculated) : 57 Adjusted Body Weight: n/a  Vital Signs: Temp: 98.7 F (37.1 C) (03/22 0800) Temp Source: Oral (03/22 0800) BP: 109/59 mmHg (03/22 0600) Pulse Rate: 141 (03/22 0600) Intake/Output from previous day: 03/21 0701 - 03/22 0700 In: 3597.4 [I.V.:2322.4; IV Piggyback:1275] Out: 1925 [Urine:875; Stool:1050] Intake/Output from this shift:    Labs:  Recent Labs  07/30/14 0430  07/31/14 0500 07/31/14 1450 08/01/14 0434  WBC 0.1*  --  0.6*  --  2.0*  HGB 10.8*  --  11.1*  --  10.6*  PLT 40*  --  35*  --  35*  CREATININE 0.75  < > 0.60 0.55 0.58  < > = values in this interval not displayed. Estimated Creatinine Clearance: 82.2 mL/min (by C-G formula based on Cr of 0.58). No results for input(s): VANCOTROUGH, VANCOPEAK, VANCORANDOM, GENTTROUGH, GENTPEAK, GENTRANDOM, TOBRATROUGH, TOBRAPEAK, TOBRARND, AMIKACINPEAK, AMIKACINTROU, AMIKACIN in the last 72 hours.   Microbiology: Recent Results (from the past 720 hour(s))  TECHNOLOGIST REVIEW     Status: None   Collection Time: 07/28/14  2:43 PM  Result Value Ref Range Status   Technologist Review Occasional neutrophil and lymph seen on scan   Final  Blood culture (routine single)     Status: None   Collection Time: 07/28/14  2:49 PM  Result Value Ref Range Status   Blood Culture, Routine Culture, Blood  Final    Comment: ------------------------------------------------------------------------Gram Stain Report Called to,Read Back Byand Verified With:GAIL VANDENBERG3/19/16 AT [id number] -  ===== FINAL REPORT =====KLEBSIELLA  PNEUMONIAE------------------------------------------------------------------------ KLEBSIELLA PNEUMONIAE   AMPICILLIN                       MIC      Resistant            ug/ml   AMOX/CLAVULANIC                  MIC      Sensitive          4 ug/ml    AMPICILLIN/SUL                   MIC      Sensitive          4 ug/ml   PIPERACILLIN/TAZO                MIC      Sensitive        <=4 ug/ml   IMIPENEM                         MIC      Sensitive     <=0.25 ug/ml   CEFAZOLIN                        MIC       Sensitive        <=4 ug/ml   CEFTRIAXONE  MIC      Sensitive        <=1 ug/ml   CEFTAZIDIME                      MIC      Sensitive        <=1 ug/ml   CEFEPIME                         MIC      Sensitive        <=1 ug/ml   GENTAMICIN                        MIC       Sensitive        <=1 ug/ml   TOBRAMYCIN                       MIC      Sensitive        <=1 ug/ml   CIPROFLOXACIN                    MIC      Sensitive     <=0.25 ug/ml   LEVOFLOXACIN                     MIC      Sensitive      <=0.12 ug/ml   TRIMETH/SULFA                    MIC      Sensitive       <=20 ug/mlEND OF REPORT   Culture, blood (routine x 2)     Status: None   Collection Time: 07/28/14 10:55 PM  Result Value Ref Range Status   Specimen Description BLOOD LEFT FOREARM  Final   Special Requests BOTTLES DRAWN AEROBIC ONLY 4CC  Final   Culture   Final    KLEBSIELLA OXYTOCA Note: Gram Stain Report Called to,Read Back By and Verified With: AMY ARNOLD RN @4PM  VINCJ 07/29/14 Performed at [facility]    Report Status 07/31/2014 FINAL  Final   Organism ID, Bacteria KLEBSIELLA OXYTOCA  Final      Susceptibility   Klebsiella oxytoca - MIC*    AMPICILLIN >=32 RESISTANT Resistant     AMPICILLIN/SULBACTAM 8 SENSITIVE Sensitive     CEFAZOLIN <=4 SENSITIVE Sensitive     CEFEPIME <=1 SENSITIVE Sensitive     CEFTAZIDIME <=1 SENSITIVE Sensitive     CEFTRIAXONE <=1 SENSITIVE  Sensitive     CIPROFLOXACIN <=0.25 SENSITIVE Sensitive     GENTAMICIN <=1 SENSITIVE Sensitive     IMIPENEM <=0.25 SENSITIVE Sensitive     PIP/TAZO <=4 SENSITIVE Sensitive     TOBRAMYCIN <=1 SENSITIVE Sensitive     TRIMETH/SULFA <=20 SENSITIVE Sensitive     * KLEBSIELLA OXYTOCA  Stool culture     Status: None (Preliminary result)   Collection Time: 07/29/14  1:39 AM  Result Value Ref Range Status   Specimen Description STOOL  Final   Special Requests NONE  Final   Culture   Final    NO SUSPICIOUS COLONIES, CONTINUING TO HOLD Performed at [facility]    Report Status PENDING  Incomplete  Culture, Urine     Status: None   Collection Time: 07/29/14  6:14 AM  Result Value Ref Range Status   Specimen Description  URINE, CATHETERIZED  Final   Special Requests NONE  Final   Colony Count NO GROWTH Performed at [facility]   Final   Culture NO GROWTH Performed at [facility]   Final   Report Status 07/30/2014 FINAL  Final  MRSA PCR Screening     Status: None   Collection Time: 07/29/14 10:31 AM  Result Value Ref Range Status   MRSA by PCR NEGATIVE NEGATIVE Final    Comment:        The GeneXpert MRSA Assay (FDA approved for NASAL specimens only), is one component of a comprehensive MRSA colonization surveillance program. It is not intended to diagnose MRSA infection nor to guide or monitor treatment for MRSA infections.   Clostridium Difficile by PCR     Status: None   Collection Time: 07/31/14  3:35 PM  Result Value Ref Range Status   C difficile by pcr NEGATIVE NEGATIVE Final    Medical History: Past Medical History  Diagnosis Date  . Depression   . DVT (deep venous thrombosis)     hx of on HRT left leg ~2006  . GERD (gastroesophageal reflux disease)   . Hyperlipidemia   . Hypertension   . Hypothyroidism   . PPD positive, treated     rx inh   . Diabetes mellitus   . 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 DJD  (degenerative joint disease) of lumbar spine   . Chronic diastolic CHF (congestive heart failure)     a. 08/2012 Echo: EF 55-60%, no rwma, mod MR.  [name] A-fib     a. on amio for rate control/coumadin.  [name] Physical deconditioning   . Allergy   . Anemia     low iron hx  . Blood transfusion without reported diagnosis     2 u prbc  . Cataract     removed ou  . OSA on CPAP     cpap 4.5 setting  . Breast cancer 2014    a. 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 cefTRIAXone (ROCEPHIN)  IV  2 g Intravenous Q24H  . famotidine (PEPCID) IV  20 mg Intravenous Q24H  . insulin aspart  0-9 Units Subcutaneous 6 times per day  . letrozole  2.5 mg Oral Daily  . levothyroxine  150 mcg Oral QAC breakfast  . metoprolol tartrate  25 mg Oral BID  . potassium phosphate IVPB (mmol)  30 mmol Intravenous Once  . sodium chloride  1,000 mL Intravenous Once  . sodium chloride  10-40 mL Intracatheter Q12H   Infusions:  . sodium chloride 10 mL/hr at 08/01/14 0600  . amiodarone 30 mg/hr (08/01/14 0600)  . dextrose 5 % and 0.9 % NaCl with KCl 20 mEq/L     PRN: acetaminophen, ALPRAZolam, fentaNYL, loperamide, ondansetron **OR** ondansetron (ZOFRAN) IV, sodium chloride   Assessment: 59 yo presents to [facility] with FN to treat with vanc/cefepime per pharmacy dosing. Patient with breast and ovarian cancer currently receiving letrozole and cabo/Taxol, cycle 2 completed 3/14 with Neulasta support.  Also received Cipro/Flagyl for possible colitis.  Narrowing 3/22 to ceftriaxone for K oxytoca bacteremia.  3/18 >> vanc >> 3/19 3/18 >> cefepime >>3/22 3/19 >> micafungin x 1 3/19 >> cipro >>  3/19 >> flagyl >> 3/20 3/22 >> ceftriaxone >>  Tmax: 101.9 on 3/20 --> now  afebrile WBCs: improved to 2.0, ANC 0.2 on 3/21 (neulasta 3/15, neupogen 3/19 >> 3/21) Renal: SCr improved, CrCl 82,  good UOP Lactic acid: 5.8-->7.7-->6.0 (3/19)  3/18 blood: K oxytoca, R - Amp only 3/18 urine: NGF (U/A amber/cloudy) 3/19 stool: no suspicious colonies to date 3/19 MRSA PCR negative 3/21 C diff: negative   Goal of Therapy:   Eradication of infection  Appropriate antibiotic dosing for indication and renal function  Plan:   Begin ceftriaxone 2g IV q24h today approx 8 hrs after last cefepime dose  Follow clinical course, culture results as available, renal function  Follow LOT  Pharmacy to follow peripherally   Reuel Boom, PharmD Pager: [phone] 08/01/2014, 9:29 AM

## 2014-08-01 NOTE — Progress Notes (Signed)
eLink Physician-Brief Progress Note Patient Name: [name] DOB: 12-22-1955 MRN: [id number]   Date of Service  08/01/2014  HPI/Events of Note  Ca 6.4, but albumin 1.9  eICU Interventions  Corrects close to 9        FEINSTEIN,DANIEL J. 08/01/2014, 6:15 PM

## 2014-08-01 NOTE — Progress Notes (Signed)
COURTESY NOTE: "I feel very anxious." Receiving alprazolam PRN, 3 times yesterday. WBC up to 2.0 today. Gram neg = klebsiella and she is on double coverage. Hoping to be out of the unit in 3 days and home next week. Husband in room.

## 2014-08-01 NOTE — Progress Notes (Signed)
PULMONARY / CRITICAL CARE MEDICINE   Name: [name] MRN: [id number] DOB: 1955/10/17    ADMISSION DATE:  07/28/2014 CONSULTATION DATE:  07/29/14  REFERRING MD :  [facility]  CHIEF COMPLAINT:  shock  INITIAL PRESENTATION: 59 yr old s/p chemo ovarian ca and Breast Ca, shock  STUDIES:  3/19 [name] abdo>>>hepatic parenchymal disease, used which can include steatosis. Left trace fluid seen around the liver.  SIGNIFICANT EVENTS: 3/19- 8 liters given, levo ordered 3/19 - shock, line, pressors, low output 3/20- off pressors, high stool output remains 3/20 Afib(caf on coumadin) RVR amio drip 3/22 added imodium   SUBJECTIVE: increased stool remains  VITAL SIGNS: Temp:  [98.3 F (36.8 C)-99.6 F (37.6 C)] 98.7 F (37.1 C) (03/22 0800) Pulse Rate:  [94-173] 141 (03/22 0600) Resp:  [24-32] 30 (03/22 0600) BP: (86-126)/(46-76) 109/59 mmHg (03/22 0600) SpO2:  [94 %-97 %] 94 % (03/22 0600) Arterial Line BP: (72-125)/(49-73) 103/55 mmHg (03/22 0600) Weight:  [194 lb 14.2 oz (88.4 kg)] 194 lb 14.2 oz (88.4 kg) (03/22 0400) HEMODYNAMICS: CVP:  [2 mmHg-14 mmHg] 14 mmHg VENTILATOR SETTINGS:   INTAKE / OUTPUT:  Intake/Output Summary (Last 24 hours) at 08/01/14 0908 Last data filed at 08/01/14 0600  Gross per 24 hour  Intake 3162.4 ml  Output   1725 ml  Net 1437.4 ml    PHYSICAL EXAMINATION: General:  Awake, mild anxiety , nonfocal Neuro:  Moves upper ext equal , perrl, intact Head: [place]/AT Eyes: PERRL, EOM-I and MMM. Neck: line rt ij clean Cardiovascular: afib rvr hr 120 Lungs:  Mild crackels bases, CTA anterior port clean Abdomen:  Soft, scar healed, no r/g, NT, rectal tube with fluid loss 1++ litre daily Musculoskeletal:  No pain, no edema Skin:  No rash  LABS:  CBC  Recent Labs Lab 07/30/14 0430 07/31/14 0500 08/01/14 0434  WBC 0.1* 0.6* 2.0*  HGB 10.8* 11.1* 10.6*  HCT 30.9* 31.4* 30.1*  PLT 40* 35* 35*   Coag's  Recent Labs Lab 07/30/14 0430 07/31/14 0500  08/01/14 0434  APTT 35 31 35  INR 2.44* 1.90* 1.63*   BMET  Recent Labs Lab 07/31/14 0500 07/31/14 1450 08/01/14 0434  NA 130* 131* 132*  K 2.7* 3.4* 2.7*  CL 101 103 97  CO2 20 22 25   BUN 12 11 14   CREATININE 0.60 0.55 0.58  GLUCOSE 235* 178* 179*   Electrolytes  Recent Labs Lab 07/30/14 1750 07/31/14 0500 07/31/14 1450 08/01/14 0434  CALCIUM 6.6* 6.7* 6.8* 6.6*  MG 1.5  --   --  2.1  PHOS  --   --   --  <1.0*   Sepsis Markers  Recent Labs Lab 07/28/14 2355 07/29/14 0626 07/29/14 1318  LATICACIDVEN 5.8* 7.7* 6.0*   ABG  Recent Labs Lab 07/29/14 0824 07/30/14 0420  PHART 7.348* 7.484*  PCO2ART 21.6* 20.5*  PO2ART 81.0 96.8   Liver Enzymes  Recent Labs Lab 07/29/14 0956 07/30/14 0430 07/31/14 0500  AST 36 36 26  ALT 36* 35 34  ALKPHOS 67 55 56  BILITOT 1.3* 1.4* 1.2  ALBUMIN 2.1* 1.9* 1.9*   Cardiac Enzymes  Recent Labs Lab 07/30/14 2030 07/31/14 0500 07/31/14 0814  TROPONINI 0.12* 0.08* 0.06*   Glucose  Recent Labs Lab 07/31/14 1225 07/31/14 1714 07/31/14 1958 07/31/14 2338 08/01/14 0338 08/01/14 0739  GLUCAP 181* 166* 177* 186* 168* 195*    Imaging Dg Chest Port 1 View  07/31/2014   CLINICAL DATA:  Sepsis  EXAM: PORTABLE CHEST -  1 VIEW  COMPARISON:  07/30/2014  FINDINGS: There is a right jugular central line extending into the low SVC. There is a left jugular Port-A-Cath with tip at the cavoatrial junction. There is unchanged cardiomegaly. There is moderate vascular and interstitial prominence. There is continued improvement with clearance of basilar opacities. No effusions are evident.  IMPRESSION: Improved, with residual vascular and interstitial prominence.   Electronically Signed   By: Andreas Newport M.D.   On: 07/31/2014 05:18     ASSESSMENT / PLAN:  PULMONARY A: Stable P:   pcxr improved IS per RT  CARDIOVASCULAR CVL 3/19>>> A: Shock (septic), hypovolemia from GI loss Worsening Afib with RVR and failing  to respond to treatment thus far Off coumadin due to platelets 35 Mild positive trop I P:  Keep pos balance Chem in am  MAP off pressors Amio drip, BB Repeat trop x 3 Check 12 lead Cards consult followed by Nasher  RENAL Lab Results  Component Value Date   CREATININE 0.58 08/01/2014   CREATININE 0.55 07/31/2014   CREATININE 0.60 07/31/2014   CREATININE 0.7 07/28/2014   CREATININE 0.7 07/24/2014   CREATININE 0.7 07/17/2014   CREATININE 0.69 11/10/2013   CREATININE 0.63 11/01/2013   CREATININE 0.69 07/25/2013   Recent Labs Lab 07/31/14 0500 07/31/14 1450 08/01/14 0434  K 2.7* 3.4* 2.7*   A:  NONAG from loose stools, high risk ATN, ARF, hypovolemia, hypoK P:   Fluid repletion IV k replete phos   GASTROINTESTINAL A:  Diarrhea chemo induced, Unlikely primary colitis / gastroenteritis, r/o cirrhosis, worsening diarrhea P:   [name] unimpressive Bili repeat Start clears ppi IVF for insensible losses Dc protonix Imodium x 4 doses  HEMATOLOGIC A:  Worsening neutropenia and pancytopenia secondary to chemo /sepsis, coagulapathic from coumadin , sepsis P:  Consider neupogen x 3 days then dc Cbc  scd Vit k  No role ffp INR 1.63 3/22 Fibrinogen x 1 She has Afib with RVR but plaelets of 35. Off coumadin.  Consider hemonc ocnsult in AM if continues to worsen  INFECTIOUS A:   neutopenic septic shock, r/o (favor) GI source (translocation), r/o line Check c dif for completeness 3/21 AND REPLETE LOSSES  ? Need for probiotics and antidiarrheals 3/21   P:   BCx 3/19>>>2/2 gram neg rod>>>Klebsiella oxy ss cefepime and cipro UC 3/29>>>neg 3/21 C dif >>neg Abx:  Cefepime 3/19>>>3/22 cipro 3/19>>>3/22 vanc 3/19>>>3/20 Flagyl 3/19>>>3/20 myco 3/19>>>3/19 Rocephin 3/22>>>  Dc flagyl, vanc Follow ID gram neg rod kLEB OXY 3/22 start roc per pharm>>  ENDOCRINE CBG (last 3)   Recent Labs  07/31/14 2338 08/01/14 0338 08/01/14 0739  GLUCAP 186* 168* 195*   A:   R/o rel AI  P:   Cortisol 150 3/21 dc steroids cbg  NEUROLOGIC A:  Encephalaopthy septic - resolving, follows commands, better, complains of anxiety P:   RASS goal: 0  FAMILY  - Updates: husband daily  - Inter-disciplinary family meet or Palliative Care meeting due by: 3/26  Richardson Landry Minor ACNP Maryanna Shape PCCM Pager [phone] till 3 pm If no answer page [phone]  A-fib with RVR worsened overnight, diarrhea worsened and platelet counts deteriorating.  Will call cardiology to manage a-fib, currently on amiodarone and beta blockers, may need cardizem but will defer to cards.  Since c-dif is negative will add imodium to control diarrhea.  If platelet continues to worsen then will call H/O in AM.  In the meantime, hold int he SDU until vitals are more stable and  will continue other management as above.  Patient seen and examined, agree with above note.  I dictated the care and orders written for this patient under my direction.  Rush Farmer, MD [phone]

## 2014-08-02 ENCOUNTER — Ambulatory Visit: Payer: Self-pay | Admitting: Oncology

## 2014-08-02 ENCOUNTER — Inpatient Hospital Stay (HOSPITAL_COMMUNITY): Payer: BLUE CROSS/BLUE SHIELD

## 2014-08-02 ENCOUNTER — Other Ambulatory Visit: Payer: Self-pay | Admitting: *Deleted

## 2014-08-02 DIAGNOSIS — E861 Hypovolemia: Secondary | ICD-10-CM | POA: Insufficient documentation

## 2014-08-02 DIAGNOSIS — I959 Hypotension, unspecified: Secondary | ICD-10-CM | POA: Insufficient documentation

## 2014-08-02 DIAGNOSIS — I4891 Unspecified atrial fibrillation: Secondary | ICD-10-CM

## 2014-08-02 LAB — GLUCOSE, CAPILLARY
GLUCOSE-CAPILLARY: 145 mg/dL — AB (ref 70–99)
GLUCOSE-CAPILLARY: 141 mg/dL — AB (ref 70–99)
Glucose-Capillary: 149 mg/dL — ABNORMAL HIGH (ref 70–99)
Glucose-Capillary: 150 mg/dL — ABNORMAL HIGH (ref 70–99)
Glucose-Capillary: 157 mg/dL — ABNORMAL HIGH (ref 70–99)
Glucose-Capillary: 168 mg/dL — ABNORMAL HIGH (ref 70–99)
Glucose-Capillary: 169 mg/dL — ABNORMAL HIGH (ref 70–99)

## 2014-08-02 LAB — MAGNESIUM: Magnesium: 1.7 mg/dL (ref 1.5–2.5)

## 2014-08-02 LAB — CBC WITH DIFFERENTIAL/PLATELET
BASOS PCT: 0 % (ref 0–1)
Basophils Absolute: 0 10*3/uL (ref 0.0–0.1)
EOS PCT: 0 % (ref 0–5)
Eosinophils Absolute: 0 10*3/uL (ref 0.0–0.7)
HEMATOCRIT: 31.2 % — AB (ref 36.0–46.0)
Hemoglobin: 10.9 g/dL — ABNORMAL LOW (ref 12.0–15.0)
LYMPHS ABS: 0.5 10*3/uL — AB (ref 0.7–4.0)
Lymphocytes Relative: 17 % (ref 12–46)
MCH: 30.6 pg (ref 26.0–34.0)
MCHC: 34.9 g/dL (ref 30.0–36.0)
MCV: 87.6 fL (ref 78.0–100.0)
MONO ABS: 0.1 10*3/uL (ref 0.1–1.0)
Monocytes Relative: 4 % (ref 3–12)
NEUTROS ABS: 2.4 10*3/uL (ref 1.7–7.7)
Neutrophils Relative %: 79 % — ABNORMAL HIGH (ref 43–77)
Platelets: DECREASED 10*3/uL (ref 150–400)
RBC: 3.56 MIL/uL — ABNORMAL LOW (ref 3.87–5.11)
RDW: 14.7 % (ref 11.5–15.5)
WBC: 3 10*3/uL — ABNORMAL LOW (ref 4.0–10.5)

## 2014-08-02 LAB — BASIC METABOLIC PANEL
ANION GAP: 9 (ref 5–15)
BUN: 10 mg/dL (ref 6–23)
CALCIUM: 6.6 mg/dL — AB (ref 8.4–10.5)
CO2: 24 mmol/L (ref 19–32)
Chloride: 102 mmol/L (ref 96–112)
Creatinine, Ser: 0.49 mg/dL — ABNORMAL LOW (ref 0.50–1.10)
GFR calc Af Amer: >90 mL/min (ref >90)
Glucose, Bld: 147 mg/dL — ABNORMAL HIGH (ref 70–99)
POTASSIUM: 2.9 mmol/L — AB (ref 3.5–5.1)
Sodium: 135 mmol/L (ref 135–145)

## 2014-08-02 LAB — STOOL CULTURE

## 2014-08-02 LAB — PROTIME-INR
INR: 1.53 — AB (ref 0.00–1.49)
Prothrombin Time: 18.5 seconds — ABNORMAL HIGH (ref 11.6–15.2)

## 2014-08-02 LAB — PHOSPHORUS: Phosphorus: 1 mg/dL — CL (ref 2.3–4.6)

## 2014-08-02 IMAGING — CR DG ABDOMEN 1V
1 series · 2 of 2 positions shown · non-contrast
Comparison: Abdominal radiograph performed 08/03/2012

CLINICAL DATA: Check progression of barium contrast.

ABDOMEN - 1 VIEW

[Series 1: AP · U · 2 of 2 slices shown]
[im 1/2]
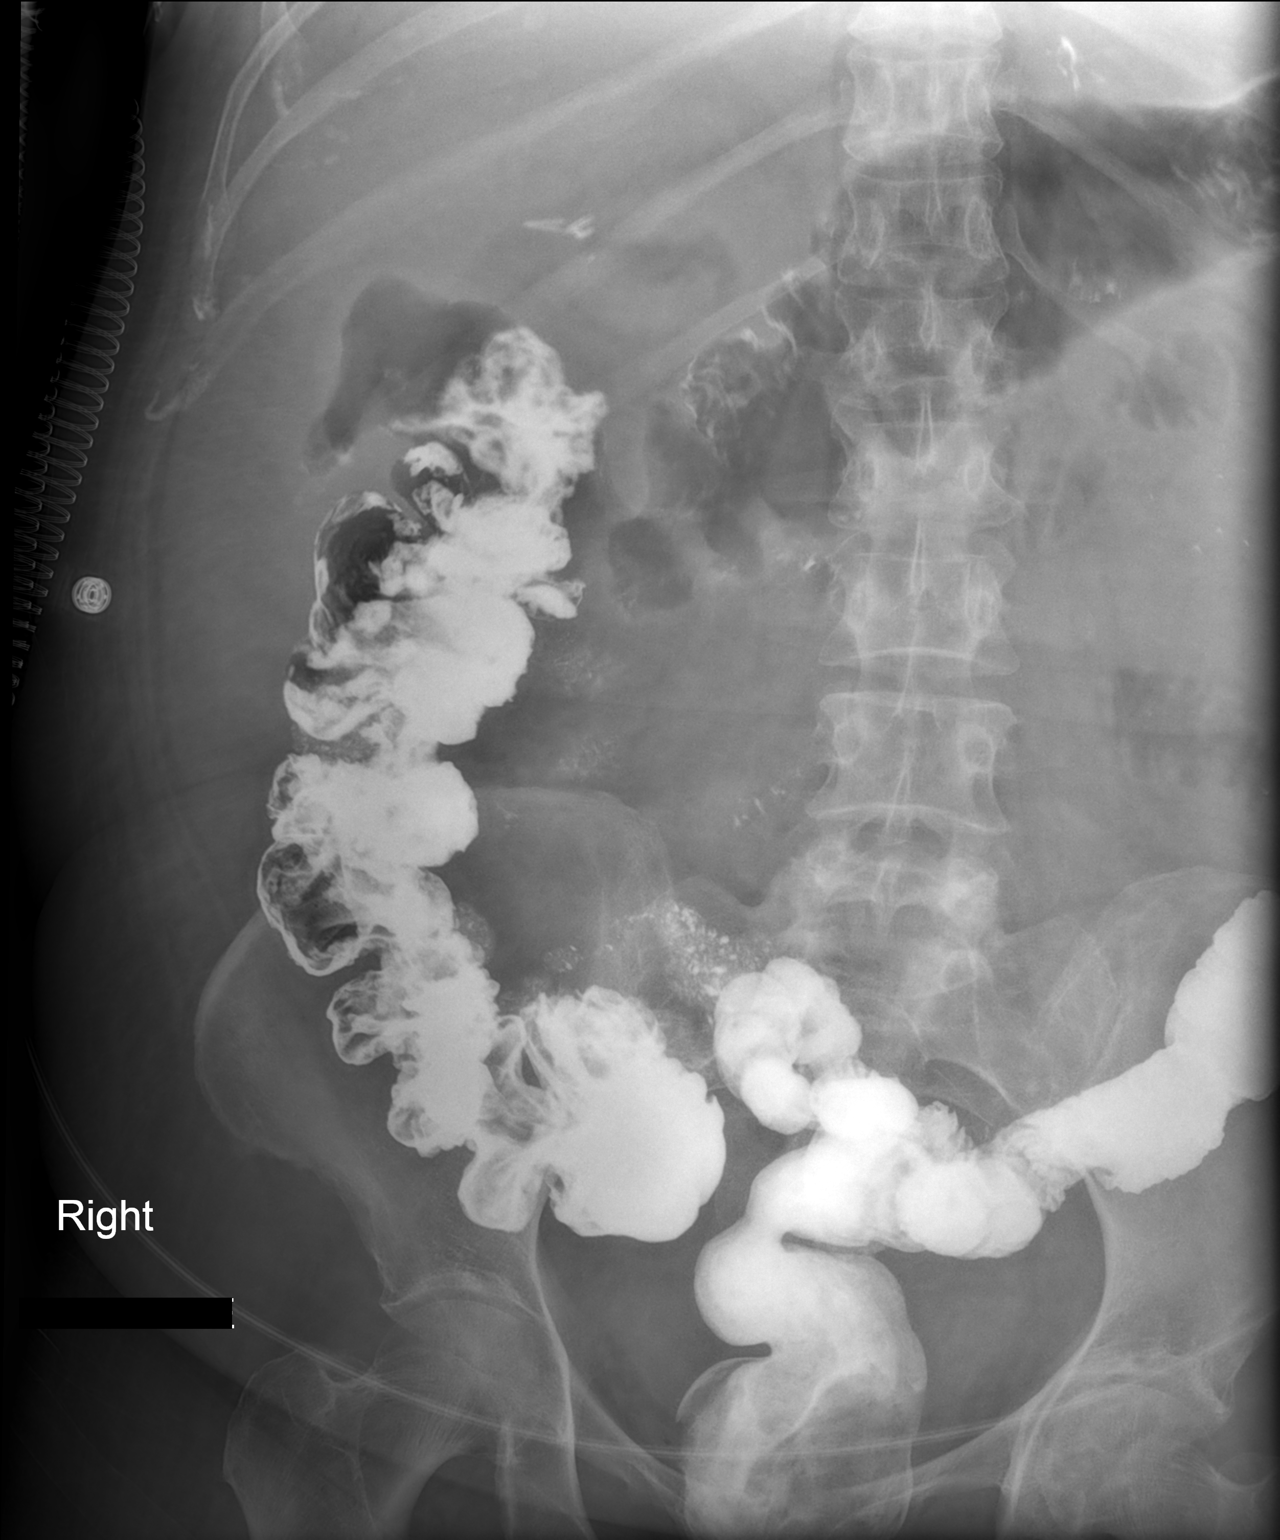
[im 2/2]
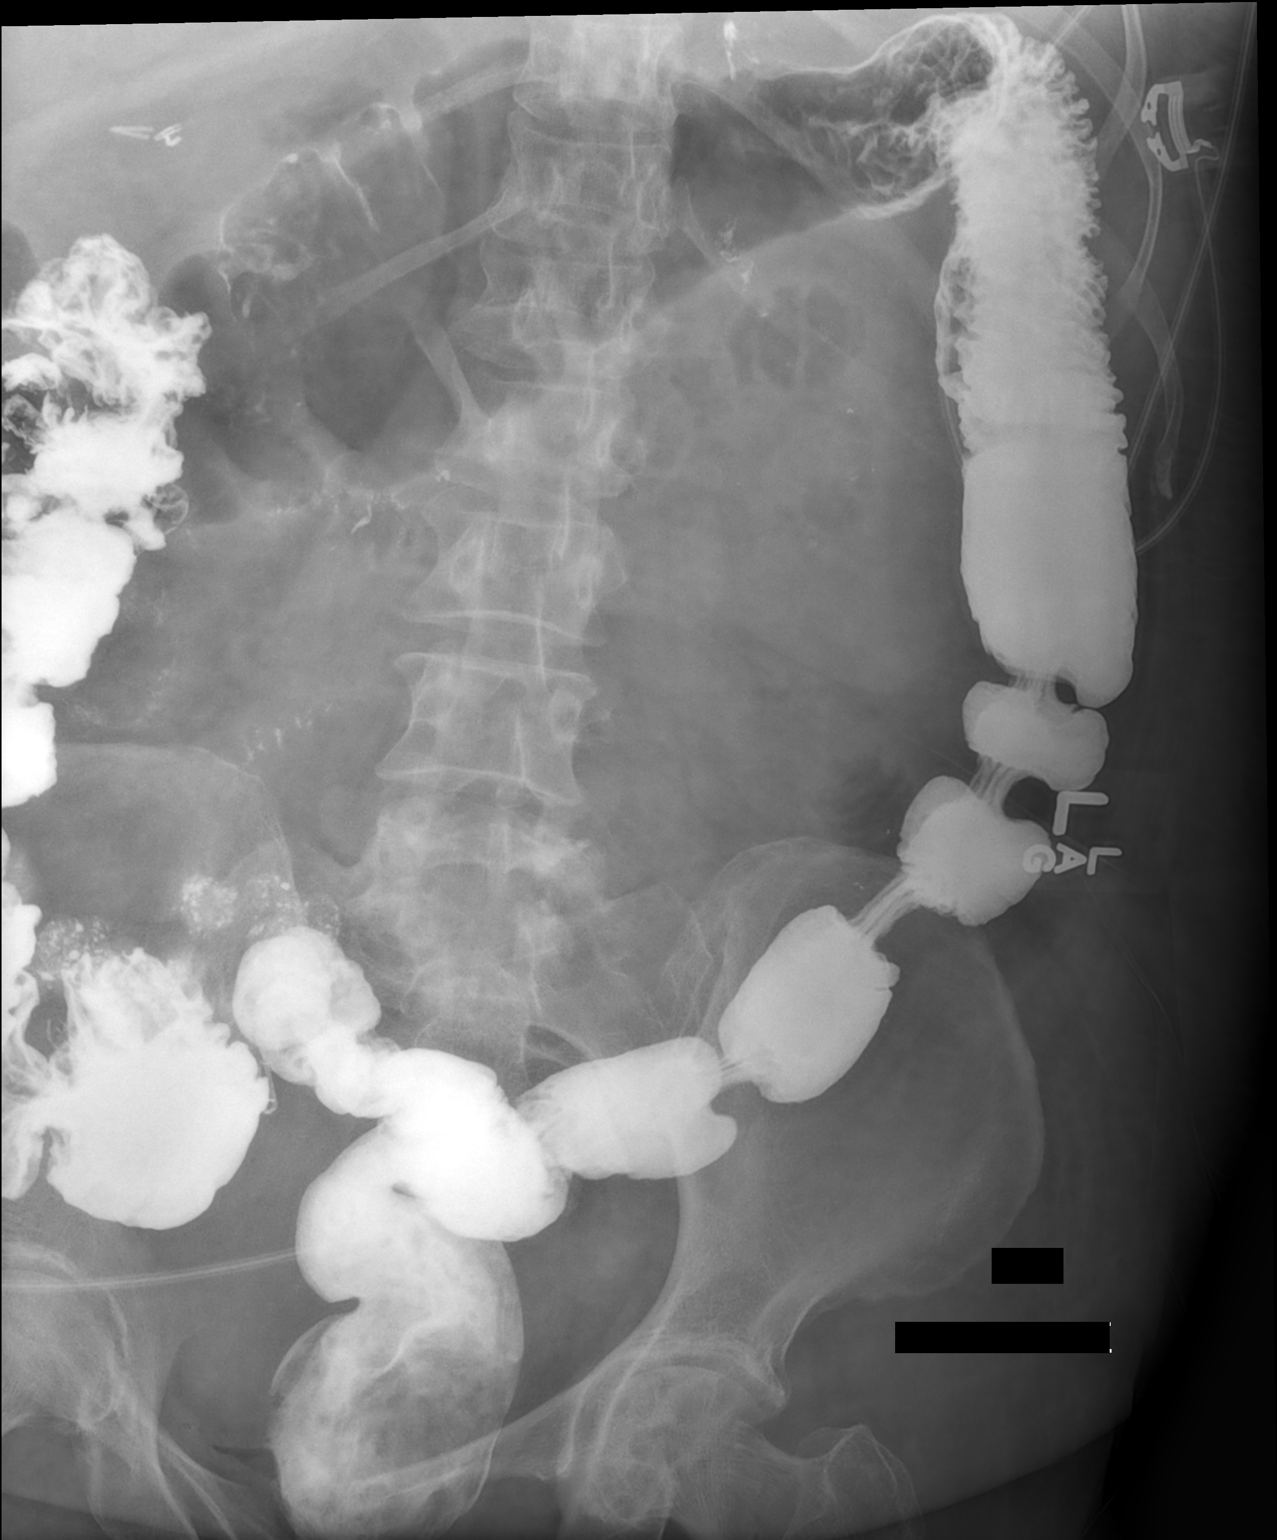

[2 of 2 positions shown; findings below may reference images not displayed]

FINDINGS: Ingested barium contrast has progressed into the colon,
with contrast seen filling the ascending, descending and sigmoid
colon, to the level of the rectum.  Visualized small bowel loops
are grossly unremarkable in appearance.  No free intra-abdominal
air is identified, though evaluation for free air is suboptimal on
a supine views.

Clips are noted within the right upper quadrant, reflecting prior
cholecystectomy.  No acute osseous abnormalities are seen.
IMPRESSION: Progression of ingested barium contrast into the colon, with
contrast filling the ascending, descending and sigmoid colon, to
the level of the rectum.  Unremarkable bowel gas pattern; no free
intra-abdominal air seen.

## 2014-08-02 IMAGING — XA IR PERC PLACEMENT GASTROSTOMY
1 series · 4 of 4 positions shown · non-contrast
Comparison: none

CLINICAL DATA: Breast carcinoma, malnutrition and failure to
thrive.

[Series 300: tube placements · 4 of 4 slices shown]
[im 1/4]
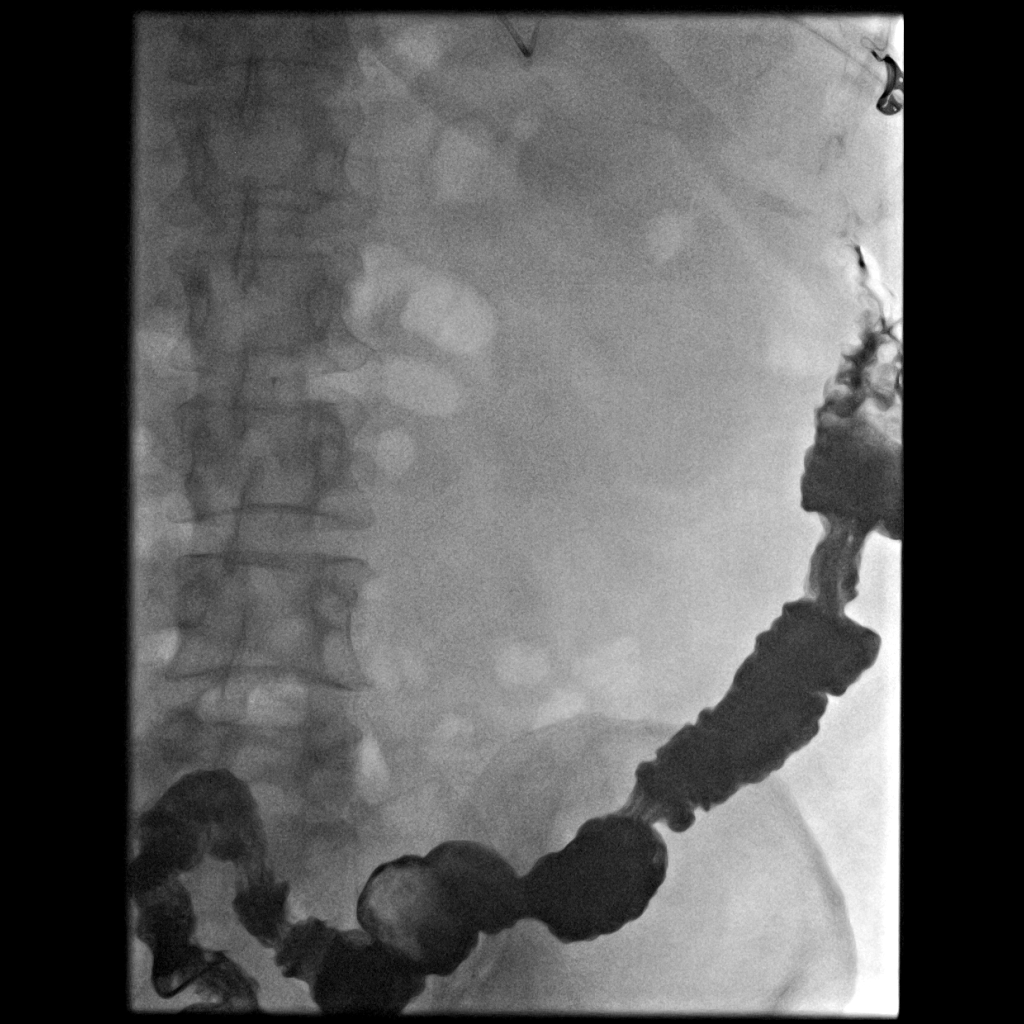
[im 2/4]
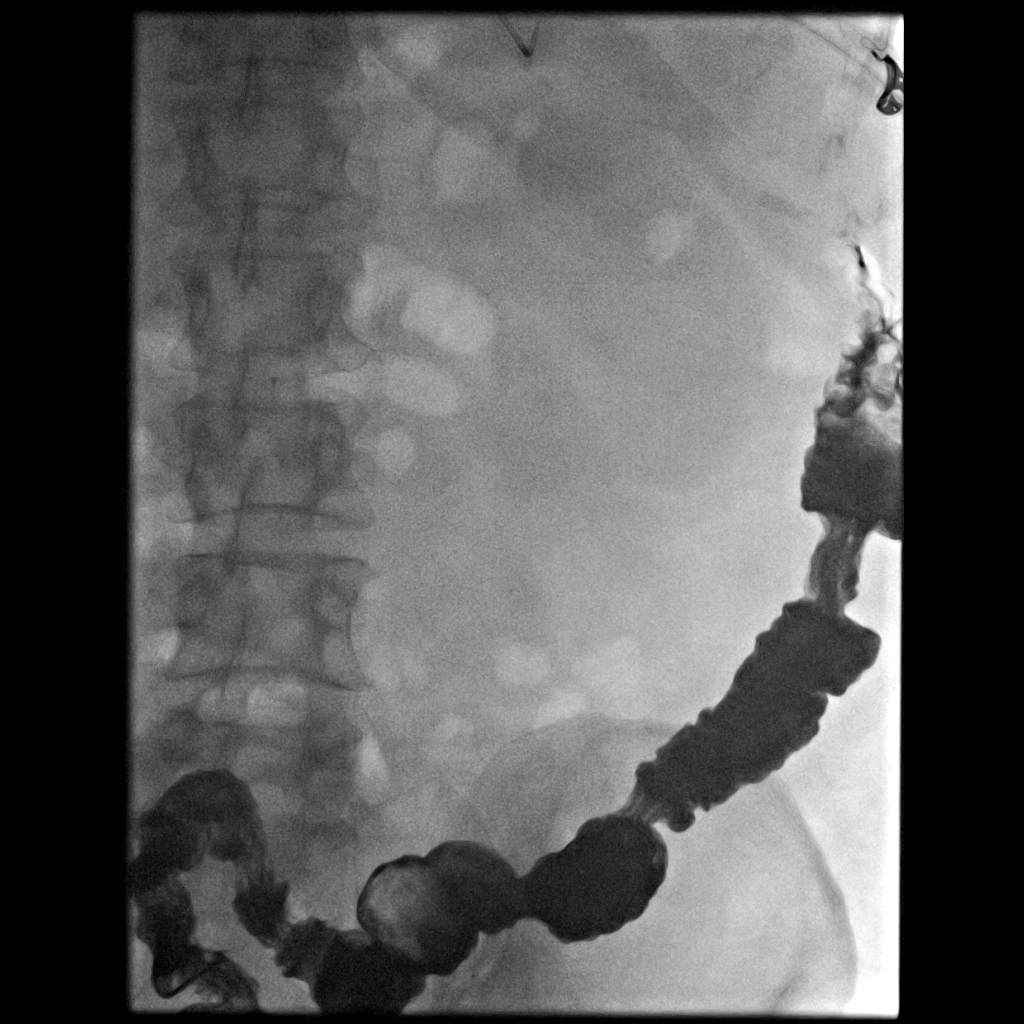
[im 3/4]
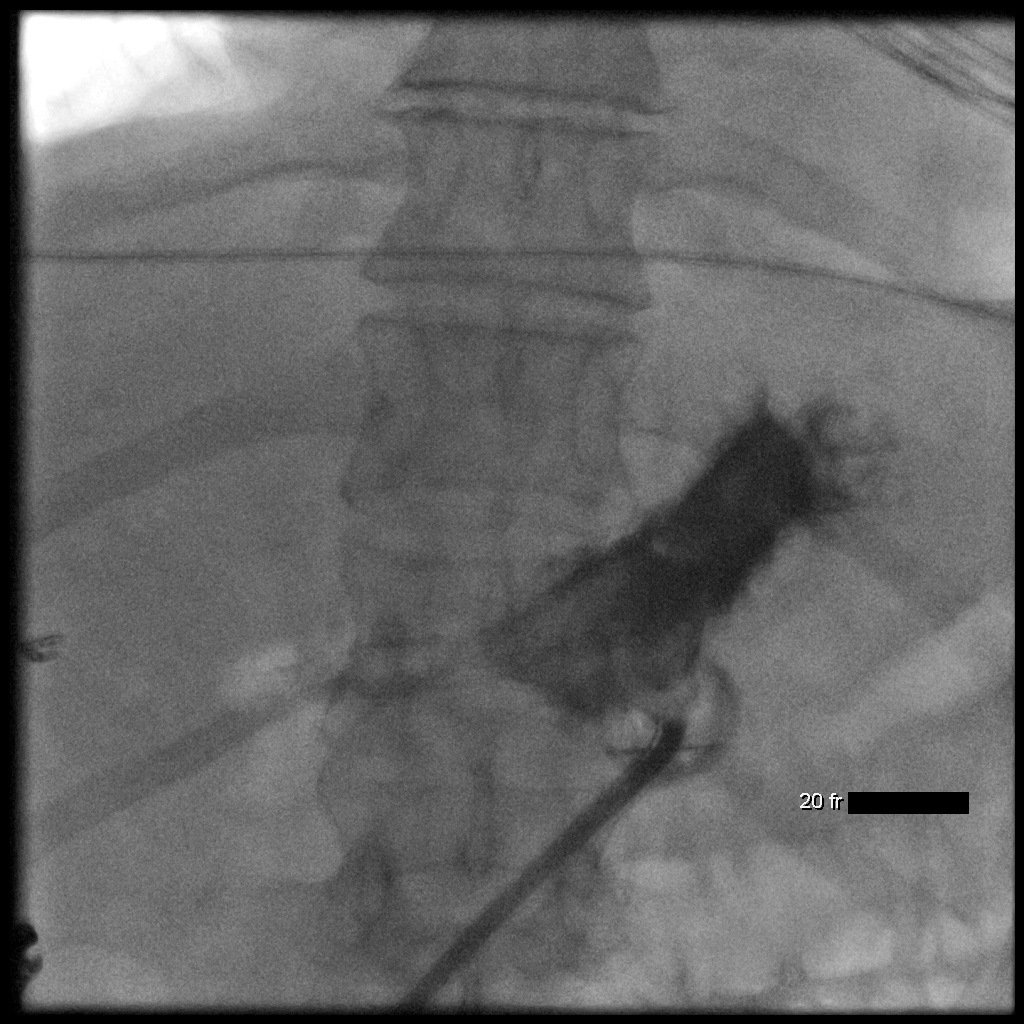
[im 4/4]
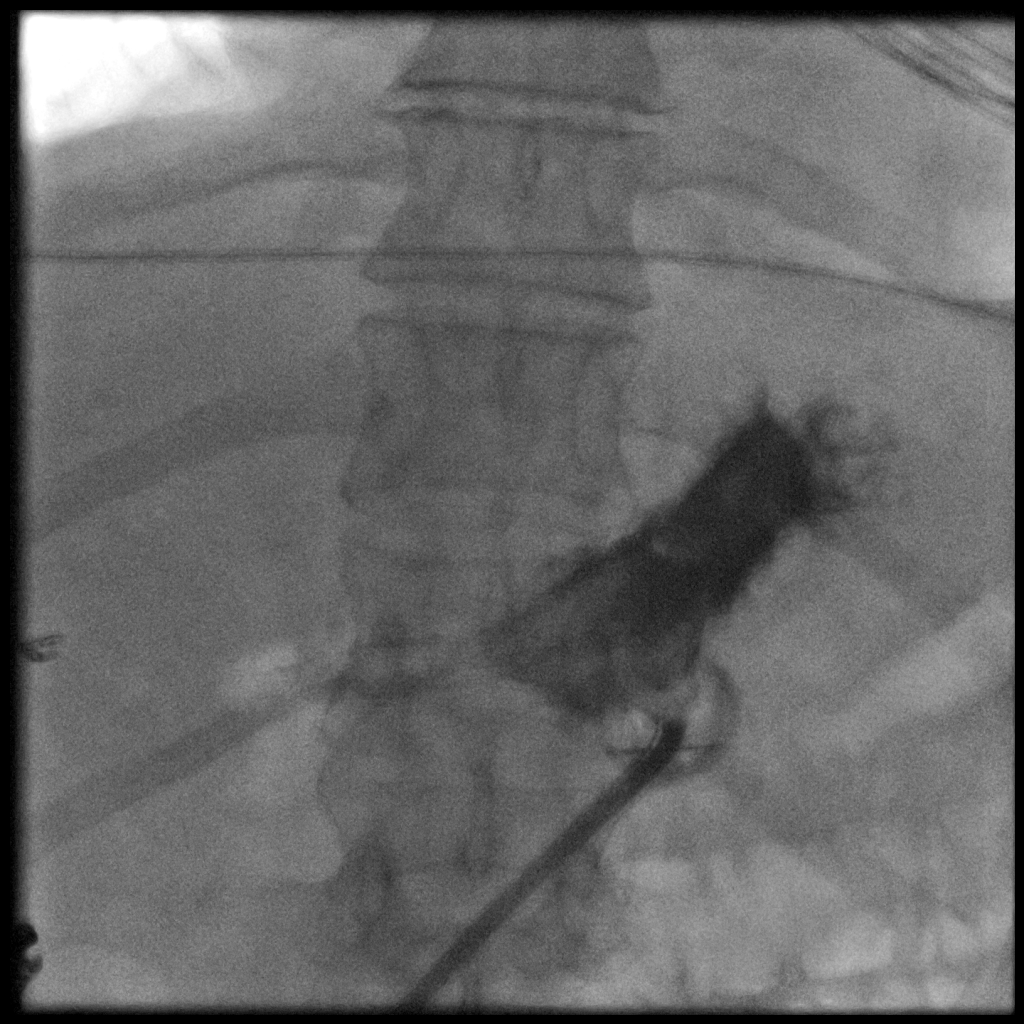

[4 of 4 positions shown; findings below may reference images not displayed]

PERCUTANEOUS GASTROSTOMY TUBE PLACEMENT

Sedation:  0.5 mg IV Versed

Total Moderate Sedation Time: 10 minutes.

Contrast:  20 ml Tmnipaque-IFF

Fluoroscopy Time: 2 minutes and 42 seconds.

Procedure:  The procedure, risks, benefits, and alternatives were
explained to the patient.  Questions regarding the procedure were
encouraged and answered.  The patient understands and consents to
the procedure.

The evening prior to the procedure, the patient was given thin
liquid barium to ingest in order to opacify the colon.  A 5-French
catheter was then advanced through the patient's mouth under
fluoroscopy into the esophagus and to the level of the stomach.
This catheter was used to insufflate the stomach with air under
fluoroscopy.

The abdominal wall was prepped with Betadine in a sterile fashion,
and a sterile drape was applied covering the operative field.  A
sterile gown and sterile gloves were used for the procedure. Local
anesthesia was provided with 1% Lidocaine.

A skin incision was made in the upper abdominal wall.  Under
fluoroscopy, an 18 gauge trocar needle was advanced into the
stomach.  Contrast injection was performed to confirm intraluminal
position of the needle tip.  A single T tack was then deployed in
the lumen of the stomach.  This was brought up to tension at the
skin surface.

Over a guidewire, a 9-French sheath was advanced into the lumen of
the stomach.  The wire was left in place as a safety wire.  A loop
snare device from a percutaneous gastrostomy kit was then advanced
into the stomach.

A floppy guide wire was advanced through the orogastric catheter
under fluoroscopy in the stomach.  The loop snare advanced through
the percutaneous gastric access was used to snare the guide wire.
This allowed withdrawal of the loop snare out of the patient's
mouth by retraction of the orogastric catheter and wire.

A 20-French bumper retention gastrostomy tube was looped around the
snare device.  It was then pulled back through the patient's mouth.
The retention bumper was brought up to the anterior gastric wall.
The T tack suture was cut at the skin.  The exiting gastrostomy
tube was cut to appropriate length and a feeding adapter applied.
The catheter was injected with contrast material to confirm
position and a fluoroscopic spot image saved.  The tube was then
flushed with saline.  A dressing was applied over the gastrostomy
exit site.

Complications:  None
FINDINGS: Initial fluoroscopy demonstrates adequate opacification
of the colon by ingested barium in order to prevent colonic injury
during the procedure.  The stomach distended well with air allowing
safe placement of the gastrostomy tube.  After placement, the tip
of the gastrostomy tube lies in the body of the stomach.
IMPRESSION: Percutaneous gastrostomy with placement of a 20-French bumper
retention tube in the body of the stomach.  This tube can be used
for percutaneous feeds beginning in 24 hours after placement.

## 2014-08-02 MED ORDER — SODIUM CHLORIDE 0.9 % IV BOLUS (SEPSIS)
750.0000 mL | Freq: Once | INTRAVENOUS | Status: AC
  Administered 2014-08-02: 750 mL via INTRAVENOUS

## 2014-08-02 MED ORDER — CHLORHEXIDINE GLUCONATE 0.12 % MT SOLN
15.0000 mL | Freq: Two times a day (BID) | OROMUCOSAL | Status: DC
  Administered 2014-08-03 – 2014-08-05 (×3): 15 mL via OROMUCOSAL
  Filled 2014-08-02 (×7): qty 15

## 2014-08-02 MED ORDER — INSULIN ASPART 100 UNIT/ML ~~LOC~~ SOLN
0.0000 [IU] | Freq: Four times a day (QID) | SUBCUTANEOUS | Status: DC
  Administered 2014-08-02: 2 [IU] via SUBCUTANEOUS
  Administered 2014-08-02: 3 [IU] via SUBCUTANEOUS

## 2014-08-02 MED ORDER — ESMOLOL HCL-SODIUM CHLORIDE 2000 MG/100ML IV SOLN
25.0000 ug/kg/min | INTRAVENOUS | Status: DC
  Administered 2014-08-02 (×2): 25 ug/kg/min via INTRAVENOUS
  Filled 2014-08-02 (×2): qty 100

## 2014-08-02 MED ORDER — DIGOXIN 0.25 MG/ML IJ SOLN
0.2500 mg | INTRAMUSCULAR | Status: AC
  Administered 2014-08-02 (×3): 0.25 mg via INTRAVENOUS
  Filled 2014-08-02 (×3): qty 1

## 2014-08-02 MED ORDER — POTASSIUM PHOSPHATES 15 MMOLE/5ML IV SOLN
30.0000 mmol | Freq: Once | INTRAVENOUS | Status: AC
  Administered 2014-08-02: 30 mmol via INTRAVENOUS
  Filled 2014-08-02: qty 10

## 2014-08-02 MED ORDER — CETYLPYRIDINIUM CHLORIDE 0.05 % MT LIQD
7.0000 mL | Freq: Two times a day (BID) | OROMUCOSAL | Status: DC
  Administered 2014-08-03: 7 mL via OROMUCOSAL

## 2014-08-02 MED ORDER — ALBUMIN HUMAN 5 % IV SOLN
25.0000 g | Freq: Once | INTRAVENOUS | Status: AC
  Administered 2014-08-02: 25 g via INTRAVENOUS
  Filled 2014-08-02: qty 500

## 2014-08-02 MED ORDER — NOREPINEPHRINE BITARTRATE 1 MG/ML IV SOLN
2.0000 ug/min | INTRAVENOUS | Status: DC
  Filled 2014-08-02: qty 4

## 2014-08-02 MED ORDER — POTASSIUM CHLORIDE CRYS ER 20 MEQ PO TBCR
40.0000 meq | EXTENDED_RELEASE_TABLET | ORAL | Status: DC
  Administered 2014-08-02: 40 meq via ORAL
  Filled 2014-08-02: qty 2

## 2014-08-02 MED ORDER — DIGOXIN 0.25 MG/ML IJ SOLN
0.2500 mg | Freq: Every day | INTRAMUSCULAR | Status: DC
  Administered 2014-08-03: 0.25 mg via INTRAVENOUS
  Filled 2014-08-02 (×3): qty 1

## 2014-08-02 MED ORDER — POTASSIUM CHLORIDE CRYS ER 20 MEQ PO TBCR
40.0000 meq | EXTENDED_RELEASE_TABLET | Freq: Once | ORAL | Status: AC
  Administered 2014-08-02: 40 meq via ORAL
  Filled 2014-08-02: qty 2

## 2014-08-02 NOTE — Progress Notes (Signed)
eLink Physician-Brief Progress Note Patient Name: [name] DOB: 1955/07/24 MRN: [id number]   Date of Service  08/02/2014  HPI/Events of Note  MAP 62, alseep, making good urine  eICU Interventions  Sh  eis perfusing well at this MAP 62, observe output Follow MAp     Intervention Category Intermediate Interventions: Hypotension - evaluation and management  Jagar Lua J. 08/02/2014, 8:59 PM

## 2014-08-02 NOTE — Progress Notes (Addendum)
PULMONARY / CRITICAL CARE MEDICINE   Name: [name] MRN: [id number] DOB: 02-25-1956    ADMISSION DATE:  07/28/2014 CONSULTATION DATE:  07/29/14  REFERRING MD :  Triad  CHIEF COMPLAINT:  shock  INITIAL PRESENTATION: 59 yr old s/p chemo ovarian ca and Breast Ca, shock  STUDIES:  3/19 [name] abdo>>>hepatic parenchymal disease, used which can include steatosis. Left trace fluid seen around the liver.  SIGNIFICANT EVENTS: 3/19- 8 liters given, levo ordered 3/19 - shock, line, pressors, low output 3/20- off pressors, high stool output remains 3/20 Afib(caf on coumadin) RVR amio drip 3/22 added imodium 3/22 cards consult for a fib rvr  SUBJECTIVE: increased stool remains, not given imodium 3/22  VITAL SIGNS: Temp:  [98.1 F (36.7 C)-98.7 F (37.1 C)] 98.1 F (36.7 C) (03/23 0800) Pulse Rate:  [55-152] 143 (03/23 0600) Resp:  [23-32] 25 (03/23 0600) BP: (89-120)/(51-74) 120/63 mmHg (03/23 0600) SpO2:  [92 %-98 %] 95 % (03/23 0600) Arterial Line BP: (92-108)/(55-58) 96/58 mmHg (03/22 1200) Weight:  [198 lb 13.7 oz (90.2 kg)] 198 lb 13.7 oz (90.2 kg) (03/23 0500) HEMODYNAMICS: CVP:  [14 mmHg] 14 mmHg VENTILATOR SETTINGS:   INTAKE / OUTPUT:  Intake/Output Summary (Last 24 hours) at 08/02/14 0825 Last data filed at 08/02/14 0800  Gross per 24 hour  Intake 3681.98 ml  Output   2310 ml  Net 1371.98 ml    PHYSICAL EXAMINATION: General:  Awake, mild anxiety , nonfocal Neuro:  Moves upper ext equal , perrl, intact Head: [place]/AT Eyes: PERRL, EOM-I and MMM. Neck: line rt ij clean Cardiovascular: afib rvr hr 120 Lungs:  Mild crackels bases, CTA anterior port clean Abdomen:  Soft, scar healed, no r/g, NT, rectal tube with fluid loss 800cc++ litre daily Musculoskeletal:  No pain, no edema Skin:  No rash, warm  LABS:  CBC  Recent Labs Lab 07/31/14 0500 08/01/14 0434 08/02/14 0424  WBC 0.6* 2.0* 3.0*  HGB 11.1* 10.6* 10.9*  HCT 31.4* 30.1* 31.2*  PLT 35* 35*  PLATELET CLUMPS NOTED ON SMEAR, COUNT APPEARS DECREASED   Coag's  Recent Labs Lab 07/30/14 0430 07/31/14 0500 08/01/14 0434 08/02/14 0424  APTT 35 31 35  --   INR 2.44* 1.90* 1.63* 1.53*   BMET  Recent Labs Lab 08/01/14 0434 08/01/14 1534 08/02/14 0424  NA 132* 132* 135  K 2.7* 3.0* 2.9*  CL 97 98 102  CO2 25 26 24   BUN 14 12 10   CREATININE 0.58 0.46* 0.49*  GLUCOSE 179* 170* 147*   Electrolytes  Recent Labs Lab 07/30/14 1750  08/01/14 0434 08/01/14 1534 08/02/14 0424  CALCIUM 6.6*  < > 6.6* 6.4* 6.6*  MG 1.5  --  2.1  --  1.7  PHOS  --   --  <1.0*  --  1.0*  < > = values in this interval not displayed. Sepsis Markers  Recent Labs Lab 07/28/14 2355 07/29/14 0626 07/29/14 1318  LATICACIDVEN 5.8* 7.7* 6.0*   ABG  Recent Labs Lab 07/29/14 0824 07/30/14 0420  PHART 7.348* 7.484*  PCO2ART 21.6* 20.5*  PO2ART 81.0 96.8   Liver Enzymes  Recent Labs Lab 07/29/14 0956 07/30/14 0430 07/31/14 0500  AST 36 36 26  ALT 36* 35 34  ALKPHOS 67 55 56  BILITOT 1.3* 1.4* 1.2  ALBUMIN 2.1* 1.9* 1.9*   Cardiac Enzymes  Recent Labs Lab 08/01/14 1115 08/01/14 1419 08/01/14 2055  TROPONINI 0.04* 0.04* 0.04*   Glucose  Recent Labs Lab 08/01/14 1141 08/01/14 1538 08/01/14  2004 08/02/14 0028 08/02/14 0435 08/02/14 0733  GLUCAP 179* 156* 141* 157* 145* 149*    Imaging Dg Chest Port 1 View  08/01/2014   CLINICAL DATA:  Respiratory failure  EXAM: PORTABLE CHEST - 1 VIEW  COMPARISON:  07/31/2014  FINDINGS: Right jugular central line extends into the low SVC. Left jugular Port-A-Cath extends into the cavoatrial junction. There is moderate cardiomegaly, unchanged. There is moderate vascular and interstitial congestion which is worsened from the previous day. There now are mild ground-glass opacities in the central and basilar regions which may represent developing alveolar edema.  IMPRESSION: Congestive heart failure with mild worsening   Electronically  Signed   By: Andreas Newport M.D.   On: 08/01/2014 07:01     ASSESSMENT / PLAN:  PULMONARY A: Stable P:   pcxr improved IS per RT  CARDIOVASCULAR CVL 3/19>>> A: Shock (septic), hypovolemia from GI loss Worsening Afib with RVR and failing to respond to treatment thus far Off coumadin due to platelets 35 Mild positive trop I, stress most likely Hypotensive, anticipate related to BB, amio and hypovolemia. P:  Keep pos balance, fluid challenge 3/23(she cannot tolerate BB due to hypovolemia, ca channel not option)try fluids , possible dig load Chem in am  MAP off pressors Amio drip, BB->stopped for soft bp Repeat trop x 3->neg Check 12 lead-> a fib rvr Cards consult followed by Nasher, see note Add levo and volume resuscitate.  RENAL Lab Results  Component Value Date   CREATININE 0.49* 08/02/2014   CREATININE 0.46* 08/01/2014   CREATININE 0.58 08/01/2014   CREATININE 0.7 07/28/2014   CREATININE 0.7 07/24/2014   CREATININE 0.7 07/17/2014   CREATININE 0.69 11/10/2013   CREATININE 0.63 11/01/2013   CREATININE 0.69 07/25/2013    Recent Labs Lab 08/01/14 0434 08/01/14 1534 08/02/14 0424  K 2.7* 3.0* 2.9*   A:  NONAG from loose stools, high risk ATN, ARF, hypovolemia, hypoK P:   Fluid repletion IV k replete phos replete  GASTROINTESTINAL A:  Diarrhea chemo induced, Unlikely primary colitis / gastroenteritis, r/o cirrhosis, worsening diarrhea P:   [name] unimpressive Start clears ppi IVF for insensible losses Dc protonix Imodium x 4 doses(not given 3/22 as ordered RN instructed to give 3/23) PRN fentanyl for pain will help slow gut  HEMATOLOGIC Lab Results  Component Value Date   INR 1.53* 08/02/2014   INR 1.63* 08/01/2014   INR 1.90* 07/31/2014   PROTIME 34.8* 07/24/2014   PROTIME 21.6* 07/17/2014   PROTIME 15.6* 07/10/2014    A:  Worsening neutropenia and pancytopenia secondary to chemo /sepsis, coagulapathic from coumadin , sepsis P:   neupogen x  3 days then dc Cbc  scd She has Afib with RVR but plaelets of 35. Off coumadin.  Consider hemonc consult if worsens  INFECTIOUS A:   neutopenic septic shock, r/o (favor) GI source (translocation), r/o line Check c dif for completeness 3/21 AND REPLETE LOSSES  ? Need for probiotics and antidiarrheals 3/21   P:   BCx 3/19>>>2/2 gram neg rod>>>Klebsiella oxy ss cefepime and cipro UC 3/29>>>neg 3/21 C dif >>neg Abx:  Cefepime 3/19>>>3/22 cipro 3/19>>>3/22 vanc 3/19>>>3/20 Flagyl 3/19>>>3/20 myco 3/19>>>3/19 Rocephin 3/22>>>  Dc flagyl, vanc Follow ID gram neg rod KLEB OXY 3/22 start roc per pharm>>  ENDOCRINE CBG (last 3)   Recent Labs  08/02/14 0028 08/02/14 0435 08/02/14 0733  GLUCAP 157* 145* 149*   A:  R/o rel AI  P:   Cortisol 150 3/21 dc steroids cbg  NEUROLOGIC  A:  Encephalaopthy septic - resolving, follows commands, better, complains of anxiety P:   RASS goal: 0  FAMILY  - Updates: husband daily  Richardson Landry Minor ACNP [facility] PCCM Pager [phone] till 3 pm If no answer page [phone]  Remains on amiodarone drip, discussed with cardiology and adding esmolol, hypovolemic due to diarrhea, will start hydration, add imodium for diarrhea control since C diff negative.  Now dropping her pressure with esmolol will give additional fluid.  If BP continues to drop then will discuss pressors use.  New and severe hypotension with SBP in the 70's.  Will make levophed available if needed.  Husband updated bedside.  Continue rocephin.  Hold int he ICU given hypotension now.  The patient is critically ill with multiple organ systems failure and requires high complexity decision making for assessment and support, frequent evaluation and titration of therapies, application of advanced monitoring technologies and extensive interpretation of multiple databases.   Critical Care Time devoted to patient care services described in this note is  35  Minutes. This time reflects time  of care of this signee Dr Jennet Maduro. This critical care time does not reflect procedure time, or teaching time or supervisory time of PA/NP/Med student/Med Resident etc but could involve care discussion time.  Rush Farmer, M.D. [facility] Pulmonary/Critical Care Medicine. Pager: [phone]. After hours pager: [phone].

## 2014-08-02 NOTE — Progress Notes (Signed)
eLink Physician-Brief Progress Note Patient Name: [name] DOB: Sep 28, 1955 MRN: [id number]   Date of Service  08/02/2014  HPI/Events of Note  Hypokalemia  eICU Interventions  Potassium replaced     Intervention Category Major Interventions: Electrolyte abnormality - evaluation and management  DETERDING,ELIZABETH 08/02/2014, 5:18 AM

## 2014-08-02 NOTE — Progress Notes (Signed)
eLink Physician-Brief Progress Note Patient Name: [name] DOB: August 19, 1955 MRN: [id number]   Date of Service  08/02/2014  HPI/Events of Note  Hypophosphatemia  eICU Interventions  Phos replaced     Intervention Category Major Interventions: Electrolyte abnormality - evaluation and management  Haroon Shatto 08/02/2014, 5:45 AM

## 2014-08-02 NOTE — Progress Notes (Signed)
  Echocardiogram 2D Echocardiogram has been performed.  Bobbye Charleston 08/02/2014, 1:31 PM

## 2014-08-02 NOTE — Progress Notes (Signed)
CRITICAL VALUE ALERT  Critical value received:  Phos 1.0  Date of notification:  08/02/14  Time of notification:  0530  Critical value read back yes  Nurse who received alert:  Perry Mount RN  MD notified (1st page):  Deterding  Time of first page:  0545  MD notified (2nd page):none  Time of second page:none  Responding MD:  Deterding  Time MD responded:  [phone]

## 2014-08-02 NOTE — Progress Notes (Signed)
Patient ID: [name], female   DOB: 26-Nov-1955, 59 y.o.   MRN: [id number]    Subjective:  Denies SSCP, palpitations or Dyspnea Large amount of rectal tube output Discussed tachycardia issues with Richardson Landry Minor Beta Blocker held due to low BP  Objective:  Filed Vitals:   08/02/14 0400 08/02/14 0500 08/02/14 0600 08/02/14 0800  BP: 97/60 102/56 120/63   Pulse: 139 146 143   Temp:  98.7 F (37.1 C)  98.1 F (36.7 C)  TempSrc:  Oral  Oral  Resp: 32 27 25   Height:      Weight:  198 lb 13.7 oz (90.2 kg)    SpO2: 93% 93% 95%     Intake/Output from previous day:  Intake/Output Summary (Last 24 hours) at 08/02/14 0857 Last data filed at 08/02/14 0800  Gross per 24 hour  Intake 3481.98 ml  Output   2310 ml  Net 1171.98 ml    Physical Exam: Chronically ill white female Lungs clear anteriorly S1/S2 no murmur rub or click Mid line Abdominal scar BS possitive Right IJ and left subclavian porta cath No edema Plus 2 PT;s   Lab Results: Basic Metabolic Panel:  Recent Labs  08/01/14 0434 08/01/14 1534 08/02/14 0424  NA 132* 132* 135  K 2.7* 3.0* 2.9*  CL 97 98 102  CO2 25 26 24   GLUCOSE 179* 170* 147*  BUN 14 12 10   CREATININE 0.58 0.46* 0.49*  CALCIUM 6.6* 6.4* 6.6*  MG 2.1  --  1.7  PHOS <1.0*  --  1.0*   Liver Function Tests:  Recent Labs  07/31/14 0500  AST 26  ALT 34  ALKPHOS 56  BILITOT 1.2  PROT 4.5*  ALBUMIN 1.9*   CBC:  Recent Labs  07/31/14 0500 08/01/14 0434 08/02/14 0424  WBC 0.6* 2.0* 3.0*  NEUTROABS 0.2*  --  2.4  HGB 11.1* 10.6* 10.9*  HCT 31.4* 30.1* 31.2*  MCV 86.3 87.0 87.6  PLT 35* 35* PLATELET CLUMPS NOTED ON SMEAR, COUNT APPEARS DECREASED   Cardiac Enzymes:  Recent Labs  08/01/14 1115 08/01/14 1419 08/01/14 2055  TROPONINI 0.04* 0.04* 0.04*    Imaging: Dg Chest Port 1 View  08/01/2014   CLINICAL DATA:  Respiratory failure  EXAM: PORTABLE CHEST - 1 VIEW  COMPARISON:  07/31/2014  FINDINGS: Right jugular central  line extends into the low SVC. Left jugular Port-A-Cath extends into the cavoatrial junction. There is moderate cardiomegaly, unchanged. There is moderate vascular and interstitial congestion which is worsened from the previous day. There now are mild ground-glass opacities in the central and basilar regions which may represent developing alveolar edema.  IMPRESSION: Congestive heart failure with mild worsening   Electronically Signed   By: Andreas Newport M.D.   On: 08/01/2014 07:01    Cardiac Studies:  ECG:  afib rate 124 non specific ST/T wave changes PVC  Telemetry:  Rapid afib 120-160 bpm   Echo:  Pending  EF normal on echo 55% in 2014 reviewed   Medications:   . albumin human  25 g Intravenous Once  . cefTRIAXone (ROCEPHIN)  IV  2 g Intravenous Q24H  . famotidine (PEPCID) IV  20 mg Intravenous Q24H  . insulin aspart  0-9 Units Subcutaneous 6 times per day  . letrozole  2.5 mg Oral Daily  . levothyroxine  150 mcg Oral QAC breakfast  . potassium phosphate IVPB (mmol)  30 mmol Intravenous Once  . sodium chloride  750 mL Intravenous Once  .  sodium chloride  10-40 mL Intracatheter Q12H     . sodium chloride 10 mL/hr at 08/01/14 0600  . amiodarone 30 mg/hr (08/02/14 0527)  . dextrose 5 % and 0.9 % NaCl with KCl 20 mEq/L 75 mL/hr at 08/01/14 2326    Assessment/Plan:  Afib:  Amiodarone drip not effective.  Digoxin load as suggested by Richardson Landry Minor appropriate with normal renal function Would also agree with hydration/ volume replacement and no anticoagulation due to low PLT;s  Try low dose iv Esmolol ( ultra short acting beta blocker )  With no bolus and slow titration as this can be shut off quickly if needed  Jenkins Rouge 08/02/2014, 8:57 AM

## 2014-08-03 DIAGNOSIS — G903 Multi-system degeneration of the autonomic nervous system: Secondary | ICD-10-CM

## 2014-08-03 LAB — CBC WITH DIFFERENTIAL/PLATELET
BASOS ABS: 0 10*3/uL (ref 0.0–0.1)
BASOS PCT: 1 % (ref 0–1)
Eosinophils Absolute: 0 10*3/uL (ref 0.0–0.7)
Eosinophils Relative: 0 % (ref 0–5)
HEMATOCRIT: 31.9 % — AB (ref 36.0–46.0)
HEMOGLOBIN: 10.9 g/dL — AB (ref 12.0–15.0)
LYMPHS PCT: 21 % (ref 12–46)
Lymphs Abs: 0.8 10*3/uL (ref 0.7–4.0)
MCH: 30.6 pg (ref 26.0–34.0)
MCHC: 34.2 g/dL (ref 30.0–36.0)
MCV: 89.6 fL (ref 78.0–100.0)
MONOS PCT: 8 % (ref 3–12)
Monocytes Absolute: 0.3 10*3/uL (ref 0.1–1.0)
Neutro Abs: 2.7 10*3/uL (ref 1.7–7.7)
Neutrophils Relative %: 70 % (ref 43–77)
Platelets: 42 10*3/uL — ABNORMAL LOW (ref 150–400)
RBC: 3.56 MIL/uL — ABNORMAL LOW (ref 3.87–5.11)
RDW: 15.1 % (ref 11.5–15.5)
WBC: 3.8 10*3/uL — AB (ref 4.0–10.5)

## 2014-08-03 LAB — BASIC METABOLIC PANEL
Anion gap: 7 (ref 5–15)
BUN: 7 mg/dL (ref 6–23)
CO2: 24 mmol/L (ref 19–32)
CREATININE: 0.37 mg/dL — AB (ref 0.50–1.10)
Calcium: 7.1 mg/dL — ABNORMAL LOW (ref 8.4–10.5)
Chloride: 106 mmol/L (ref 96–112)
Glucose, Bld: 126 mg/dL — ABNORMAL HIGH (ref 70–99)
Potassium: 3.7 mmol/L (ref 3.5–5.1)
SODIUM: 137 mmol/L (ref 135–145)

## 2014-08-03 LAB — GLUCOSE, CAPILLARY
GLUCOSE-CAPILLARY: 112 mg/dL — AB (ref 70–99)
GLUCOSE-CAPILLARY: 120 mg/dL — AB (ref 70–99)
GLUCOSE-CAPILLARY: 173 mg/dL — AB (ref 70–99)
Glucose-Capillary: 135 mg/dL — ABNORMAL HIGH (ref 70–99)
Glucose-Capillary: 125 mg/dL — ABNORMAL HIGH (ref 70–99)
Glucose-Capillary: 126 mg/dL — ABNORMAL HIGH (ref 70–99)
Glucose-Capillary: 178 mg/dL — ABNORMAL HIGH (ref 70–99)

## 2014-08-03 LAB — MAGNESIUM: Magnesium: 1.6 mg/dL (ref 1.5–2.5)

## 2014-08-03 LAB — PROTIME-INR
INR: 1.84 — AB (ref 0.00–1.49)
PROTHROMBIN TIME: 21.4 s — AB (ref 11.6–15.2)

## 2014-08-03 LAB — APTT: APTT: 31 s (ref 24–37)

## 2014-08-03 LAB — PHOSPHORUS: PHOSPHORUS: 1.5 mg/dL — AB (ref 2.3–4.6)

## 2014-08-03 MED ORDER — VITAMINS A & D EX OINT
TOPICAL_OINTMENT | CUTANEOUS | Status: AC
  Filled 2014-08-03: qty 5

## 2014-08-03 MED ORDER — ESMOLOL HCL-SODIUM CHLORIDE 2000 MG/100ML IV SOLN
50.0000 ug/kg/min | INTRAVENOUS | Status: DC
Start: 2014-08-03 — End: 2014-08-03
  Administered 2014-08-03: 50 ug/kg/min via INTRAVENOUS
  Filled 2014-08-03 (×2): qty 100

## 2014-08-03 MED ORDER — POTASSIUM PHOSPHATES 15 MMOLE/5ML IV SOLN
20.0000 mmol | Freq: Once | INTRAVENOUS | Status: AC
  Administered 2014-08-03: 20 mmol via INTRAVENOUS
  Filled 2014-08-03: qty 6.67

## 2014-08-03 MED ORDER — INSULIN ASPART 100 UNIT/ML ~~LOC~~ SOLN: 3.0000 [IU] | Freq: Three times a day (TID) | SUBCUTANEOUS | Status: DC

## 2014-08-03 MED ORDER — INSULIN ASPART 100 UNIT/ML ~~LOC~~ SOLN: 0.0000 [IU] | Freq: Three times a day (TID) | SUBCUTANEOUS | Status: DC

## 2014-08-03 MED ORDER — METOPROLOL TARTRATE 25 MG PO TABS
25.0000 mg | ORAL_TABLET | Freq: Two times a day (BID) | ORAL | Status: DC
  Administered 2014-08-03 (×2): 25 mg via ORAL
  Filled 2014-08-03 (×2): qty 1

## 2014-08-03 MED ORDER — INSULIN ASPART 100 UNIT/ML ~~LOC~~ SOLN: 0.0000 [IU] | Freq: Every day | SUBCUTANEOUS | Status: DC

## 2014-08-03 NOTE — Progress Notes (Signed)
eLink Physician-Brief Progress Note Patient Name: [name] DOB: Apr 05, 1956 MRN: [id number]   Date of Service  08/03/2014  HPI/Events of Note  Pt on diet. CBGs/SSI still q 4hrs Many liters positive on I/Os Off vasopressors  eICU Interventions  DC maintenance IVFs Change SSI to ACHS     Intervention Category Major Interventions: Other:  Merton Border 08/03/2014, 6:54 PM

## 2014-08-03 NOTE — Progress Notes (Signed)
PULMONARY / CRITICAL CARE MEDICINE   Name: [name] MRN: [id number] DOB: 28-Jan-1956    ADMISSION DATE:  07/28/2014 CONSULTATION DATE:  07/29/14  REFERRING MD :  Triad  CHIEF COMPLAINT:  shock  INITIAL PRESENTATION: 59 yr old s/p chemo ovarian ca and Breast Ca, shock  STUDIES:  3/19 [name] abdo>>>hepatic parenchymal disease, used which can include steatosis. Left trace fluid seen around the liver.  SIGNIFICANT EVENTS: 3/19- 8 liters given, levo ordered 3/19 - shock, line, pressors, low output 3/20- off pressors, high stool output remains 3/20 Afib(caf on coumadin) RVR amio drip 3/22 added imodium 3/22 cards consult for a fib rvr  SUBJECTIVE: increased stool remains, not given imodium 3/22  VITAL SIGNS: Temp:  [97.5 F (36.4 C)-99.9 F (37.7 C)] 98.7 F (37.1 C) (03/24 0400) Pulse Rate:  [33-143] 133 (03/24 0900) Resp:  [17-29] 17 (03/24 0900) BP: (85-113)/(46-84) 94/70 mmHg (03/24 0900) SpO2:  [89 %-97 %] 95 % (03/24 0900) HEMODYNAMICS: CVP:  [4 mmHg-13 mmHg] 12 mmHg VENTILATOR SETTINGS:   INTAKE / OUTPUT:  Intake/Output Summary (Last 24 hours) at 08/03/14 1017 Last data filed at 08/03/14 0900  Gross per 24 hour  Intake 3120.7 ml  Output   1339 ml  Net 1781.7 ml    PHYSICAL EXAMINATION: General:  Awake, mild anxiety , nonfocal Neuro:  Moves upper ext equal , perrl, intact Head: [place]/AT Eyes: PERRL, EOM-I and MMM. Neck: line rt ij clean Cardiovascular: afib rvr hr 120 Lungs:  Mild crackels bases, CTA anterior port clean Abdomen:  Soft, scar healed, no r/g, NT, rectal tube with fluid loss <200 cc in 24 hours Musculoskeletal:  No pain, no edema Skin:  No rash, warm  LABS:  CBC  Recent Labs Lab 08/01/14 0434 08/02/14 0424 08/03/14 0450  WBC 2.0* 3.0* 3.8*  HGB 10.6* 10.9* 10.9*  HCT 30.1* 31.2* 31.9*  PLT 35* PLATELET CLUMPS NOTED ON SMEAR, COUNT APPEARS DECREASED 42*   Coag's  Recent Labs Lab 07/31/14 0500 08/01/14 0434 08/02/14 0424  08/03/14 0450  APTT 31 35  --  31  INR 1.90* 1.63* 1.53* 1.84*   BMET  Recent Labs Lab 08/01/14 1534 08/02/14 0424 08/03/14 0450  NA 132* 135 137  K 3.0* 2.9* 3.7  CL 98 102 106  CO2 26 24 24   BUN 12 10 7   CREATININE 0.46* 0.49* 0.37*  GLUCOSE 170* 147* 126*   Electrolytes  Recent Labs Lab 08/01/14 0434 08/01/14 1534 08/02/14 0424 08/03/14 0450  CALCIUM 6.6* 6.4* 6.6* 7.1*  MG 2.1  --  1.7 1.6  PHOS <1.0*  --  1.0* 1.5*   Sepsis Markers  Recent Labs Lab 07/28/14 2355 07/29/14 0626 07/29/14 1318  LATICACIDVEN 5.8* 7.7* 6.0*   ABG  Recent Labs Lab 07/29/14 0824 07/30/14 0420  PHART 7.348* 7.484*  PCO2ART 21.6* 20.5*  PO2ART 81.0 96.8   Liver Enzymes  Recent Labs Lab 07/29/14 0956 07/30/14 0430 07/31/14 0500  AST 36 36 26  ALT 36* 35 34  ALKPHOS 67 55 56  BILITOT 1.3* 1.4* 1.2  ALBUMIN 2.1* 1.9* 1.9*   Cardiac Enzymes  Recent Labs Lab 08/01/14 1115 08/01/14 1419 08/01/14 2055  TROPONINI 0.04* 0.04* 0.04*   Glucose  Recent Labs Lab 08/02/14 1746 08/02/14 1955 08/02/14 2328 08/03/14 0414 08/03/14 0600 08/03/14 0755  GLUCAP 168* 141* 135* 125* 112* 126*    Imaging Dg Abd 1 View  08/02/2014   CLINICAL DATA:  Mid abdominal pain and tenderness. Abdominal distention this morning.  EXAM:  ABDOMEN - 1 VIEW  COMPARISON:  PET-CT 06/05/2014  FINDINGS: New transverse and proximal colonic gaseous distension up to 9 cm. No dilated small bowel or stomach. Cholecystectomy and left pelvic surgical clips.  IMPRESSION: Gaseous distention of colon suggesting colonic ileus.   Electronically Signed   By: Monte Fantasia M.D.   On: 08/02/2014 09:15   Dg Chest Port 1 View  08/02/2014   CLINICAL DATA:  Respiratory failure, CHF  EXAM: PORTABLE CHEST - 1 VIEW  COMPARISON:  Portable chest x-ray of August 01, 2014  FINDINGS: The lungs are well-expanded. The interstitial markings remain increased bilaterally. The retrocardiac region is slightly less dense. The  cardiac silhouette remains enlarged. The pulmonary vascularity remains engorged. No significant pleural effusion is demonstrated. The right internal jugular venous catheter tip and the left-sided Port-A-Cath appliance tip project over the mid to distal SVC.  IMPRESSION: There has not been dramatic interval change since yesterday's study. There may be slight interval clearing of left lower lobe atelectasis.   Electronically Signed   By: David  Martinique   On: 08/02/2014 09:12     ASSESSMENT / PLAN:  PULMONARY  A: Stable OSA P:   pcxr improved IS per RT Cpap as at home  CARDIOVASCULAR CVL 3/19>>> A: Shock (septic), hypovolemia from GI loss Worsening Afib with RVR and failing to respond to treatment thus far Off coumadin due to platelets 35 Mild positive trop I, stress most likely Hypotensive, anticipate related to BB, amio and hypovolemia. P:  Keep pos balance, fluid challenge 3/23(she cannot tolerate BB due to hypovolemia, ca channel not option)try fluids , possible dig load Chem in am  MAP off pressors Amio drip, BB->stopped for soft bp Repeat trop x 3->neg Check 12 lead-> a fib rvr Cards consult followed by Nasher, see note Volume resuscitate. Dig and esmolol started 3/23 per cards with rate improved 3/24. Can start po meds soon but she may not need rate control once volume repletion obtained and sepsis totally resolved.   RENAL Lab Results  Component Value Date   CREATININE 0.37* 08/03/2014   CREATININE 0.49* 08/02/2014   CREATININE 0.46* 08/01/2014   CREATININE 0.7 07/28/2014   CREATININE 0.7 07/24/2014   CREATININE 0.7 07/17/2014   CREATININE 0.69 11/10/2013   CREATININE 0.63 11/01/2013   CREATININE 0.69 07/25/2013    Recent Labs Lab 08/01/14 1534 08/02/14 0424 08/03/14 0450  K 3.0* 2.9* 3.7   A:  NONAG from loose stools, high risk ATN, ARF, hypovolemia, hypoK(all should improve now that diarrhea is slowed 3/24) P:   Fluid repletion IV k replete phos  replete, repeat kphos 3/24, check level 3/25  GASTROINTESTINAL A:  Diarrhea chemo induced, Unlikely primary colitis / gastroenteritis, r/o cirrhosis, worsening diarrhea P:   [name] unimpressive Advance diet ppi IVF for insensible losses Dc protonix Imodium x 4 doses(not given 3/22 as ordered RN instructed to give 3/23)Diarrhea resolving 3/24 PRN fentanyl for pain will help slow gut Dc rectal tube 3/24  HEMATOLOGIC Lab Results  Component Value Date   INR 1.84* 08/03/2014   INR 1.53* 08/02/2014   INR 1.63* 08/01/2014   PROTIME 34.8* 07/24/2014   PROTIME 21.6* 07/17/2014   PROTIME 15.6* 07/10/2014    A: neutropenia and pancytopenia secondary to chemo /sepsis(improving 3/24) , coagulapathic from coumadin ,(resolved)  sepsis (resolved) Thrombocytopenia(improving) P:   neupogen x 3 days then dc Cbc  scd She has Afib with RVR but plaelets of 35. Off coumadin. Platelets are improving. May need to resume anticoagulation in  future for a fib.   INFECTIOUS A:   neutopenic septic shock, r/o (favor) GI source (translocation), r/o line  antidiarrheals 3/23   P:   BCx 3/19>>>2/2 gram neg rod>>>Klebsiella oxy ss cefepime and cipro UC 3/29>>>neg 3/21 C dif >>neg Abx:  Cefepime 3/19>>>3/22 cipro 3/19>>>3/22 vanc 3/19>>>3/20 Flagyl 3/19>>>3/20 myco 3/19>>>3/19 Rocephin 3/22>>>  Dc flagyl, vanc Follow ID gram neg rod KLEB OXY 3/22 start roc per pharm>>  ENDOCRINE CBG (last 3)   Recent Labs  08/03/14 0414 08/03/14 0600 08/03/14 0755  GLUCAP 125* 112* 126*   A:  R/o rel AI  P:   Cortisol 150 3/21 dc steroids cbg  NEUROLOGIC A:  Encephalaopthy septic - resolving, follows commands, better, complains of anxiety P:   RASS goal: 0  FAMILY  - Updates: husband daily  Diarrhea vastly decreased with imodium. Heart rate improved(most likely due to volume repletion ) Advance diet, mobilize.  Richardson Landry Minor ACNP [facility] PCCM Pager [phone] till 3 pm If no answer page  [phone]  Diarrhea much improved, I believe volume status has normalized at this point, remains in the ICU due to drips, will stop them as delineated above.  Will continue support otherwise for now.  Continue CPAP when sleeping and titrate O2 down as able.  I have personally reviewed the CXR, no evidence of pulmonary edema.  Case discussed with NP and patient and husband.  Anticipate will be able to get out of the ICU by AM if tolerating drips off.  Rush Farmer, M.D. [facility] Pulmonary/Critical Care Medicine. Pager: [phone]. After hours pager: [phone].

## 2014-08-03 NOTE — Progress Notes (Signed)
C0URTESY NOTE:   "I want to get up and try to walk."   "Can I havea ham sandwich?"  Counts greatly improved. Stool production much reduced. Hopefully she can start to be mobilized and diet can be advanced.  Greatly appreciate your help to this patient!

## 2014-08-03 NOTE — Progress Notes (Signed)
Patient had seld administered CPAP. Patient was tolerating well

## 2014-08-03 NOTE — Progress Notes (Signed)
Patient ID: [name], female   DOB: [birth year], 59 y.o.   MRN: [id number]    Subjective:  Having better am  No dyspnea chest pain or palpitations taking PO  Objective:  Filed Vitals:   08/03/14 0500 08/03/14 0530 08/03/14 0600 08/03/14 0700  BP: 92/50 104/53 100/67 96/70  Pulse: 113 99 127 127  Temp:      TempSrc:      Resp: 21 21 27 27   Height:      Weight:      SpO2: 96% 95% 96% 96%    Intake/Output from previous day:  Intake/Output Summary (Last 24 hours) at 08/03/14 0816 Last data filed at 08/03/14 0603  Gross per 24 hour  Intake 3898.85 ml  Output   1464 ml  Net 2434.85 ml    Physical Exam: Chronically ill white female Lungs clear anteriorly S1/S2 no murmur rub or click Mid line Abdominal scar BS possitive Right IJ and left subclavian porta cath No edema Plus 2 PT;s   Lab Results: Basic Metabolic Panel:  Recent Labs  08/02/14 0424 08/03/14 0450  NA 135 137  K 2.9* 3.7  CL 102 106  CO2 24 24  GLUCOSE 147* 126*  BUN 10 7  CREATININE 0.49* 0.37*  CALCIUM 6.6* 7.1*  MG 1.7 1.6  PHOS 1.0* 1.5*   CBC:  Recent Labs  08/02/14 0424 08/03/14 0450  WBC 3.0* 3.8*  NEUTROABS 2.4 2.7  HGB 10.9* 10.9*  HCT 31.2* 31.9*  MCV 87.6 89.6  PLT PLATELET CLUMPS NOTED ON SMEAR, COUNT APPEARS DECREASED 42*   Cardiac Enzymes:  Recent Labs  08/01/14 1115 08/01/14 1419 08/01/14 2055  TROPONINI 0.04* 0.04* 0.04*    Imaging: Dg Abd 1 View  08/02/2014   CLINICAL DATA:  Mid abdominal pain and tenderness. Abdominal distention this morning.  EXAM: ABDOMEN - 1 VIEW  COMPARISON:  PET-CT 06/05/2014  FINDINGS: New transverse and proximal colonic gaseous distension up to 9 cm. No dilated small bowel or stomach. Cholecystectomy and left pelvic surgical clips.  IMPRESSION: Gaseous distention of colon suggesting colonic ileus.   Electronically Signed   By: Monte Fantasia M.D.   On: 08/02/2014 09:15   Dg Chest Port 1 View  08/02/2014   CLINICAL DATA:  Respiratory  failure, CHF  EXAM: PORTABLE CHEST - 1 VIEW  COMPARISON:  Portable chest x-ray of August 01, 2014  FINDINGS: The lungs are well-expanded. The interstitial markings remain increased bilaterally. The retrocardiac region is slightly less dense. The cardiac silhouette remains enlarged. The pulmonary vascularity remains engorged. No significant pleural effusion is demonstrated. The right internal jugular venous catheter tip and the left-sided Port-A-Cath appliance tip project over the mid to distal SVC.  IMPRESSION: There has not been dramatic interval change since yesterday's study. There may be slight interval clearing of left lower lobe atelectasis.   Electronically Signed   By: David  Martinique   On: 08/02/2014 09:12    Cardiac Studies:  ECG:  afib rate 124 non specific ST/T wave changes PVC  Telemetry:  Rapid afib 120-160 bpm   Echo:  Pending  EF normal on echo 55% in 2014 reviewed   Medications:   . antiseptic oral rinse  7 mL Mouth Rinse q12n4p  . cefTRIAXone (ROCEPHIN)  IV  2 g Intravenous Q24H  . chlorhexidine  15 mL Mouth Rinse BID  . digoxin  0.25 mg Intravenous Daily  . famotidine (PEPCID) IV  20 mg Intravenous Q24H  . insulin aspart  0-15 Units  Subcutaneous 4 times per day  . letrozole  2.5 mg Oral Daily  . levothyroxine  150 mcg Oral QAC breakfast  . sodium chloride  10-40 mL Intracatheter Q12H     . sodium chloride 10 mL/hr at 08/01/14 0600  . amiodarone 30 mg/hr (08/03/14 0603)  . dextrose 5 % and 0.9 % NaCl with KCl 20 mEq/L 75 mL/hr at 08/03/14 0443  . esmolol 25 mcg/kg/min (08/02/14 2221)  . norepinephrine (LEVOPHED) Adult infusion Stopped (08/02/14 1130)    Assessment/Plan:  Afib:  Continue amiodarone  Titrate esmolol to max 100ug/kg/min  Daily iv digoxen  CVP is 12 so reasonable Volume which may have made most difference in her BP.  Using vasoconstrictors like levo would lower HR As well (reflex) but she seems to not need this currently  Discussed care with husband  Jenkins Rouge 08/03/2014, 8:16 AM

## 2014-08-04 DIAGNOSIS — I4891 Unspecified atrial fibrillation: Secondary | ICD-10-CM

## 2014-08-04 LAB — PROTIME-INR
INR: 1.79 — ABNORMAL HIGH (ref 0.00–1.49)
PROTHROMBIN TIME: 21 s — AB (ref 11.6–15.2)

## 2014-08-04 LAB — CBC WITH DIFFERENTIAL/PLATELET
BASOS ABS: 0 10*3/uL (ref 0.0–0.1)
BASOS PCT: 0 % (ref 0–1)
EOS ABS: 0 10*3/uL (ref 0.0–0.7)
Eosinophils Relative: 0 % (ref 0–5)
HEMATOCRIT: 34.3 % — AB (ref 36.0–46.0)
Hemoglobin: 11.8 g/dL — ABNORMAL LOW (ref 12.0–15.0)
LYMPHS PCT: 20 % (ref 12–46)
Lymphs Abs: 0.9 10*3/uL (ref 0.7–4.0)
MCH: 31 pg (ref 26.0–34.0)
MCHC: 34.4 g/dL (ref 30.0–36.0)
MCV: 90 fL (ref 78.0–100.0)
Monocytes Absolute: 0.4 10*3/uL (ref 0.1–1.0)
Monocytes Relative: 8 % (ref 3–12)
Neutro Abs: 3.3 10*3/uL (ref 1.7–7.7)
Neutrophils Relative %: 72 % (ref 43–77)
Platelets: 52 10*3/uL — ABNORMAL LOW (ref 150–400)
RBC: 3.81 MIL/uL — AB (ref 3.87–5.11)
RDW: 15.5 % (ref 11.5–15.5)
WBC: 4.6 10*3/uL (ref 4.0–10.5)

## 2014-08-04 LAB — GLUCOSE, CAPILLARY
Glucose-Capillary: 106 mg/dL — ABNORMAL HIGH (ref 70–99)
Glucose-Capillary: 91 mg/dL (ref 70–99)
Glucose-Capillary: 102 mg/dL — ABNORMAL HIGH (ref 70–99)
Glucose-Capillary: 95 mg/dL (ref 70–99)
Glucose-Capillary: 85 mg/dL (ref 70–99)

## 2014-08-04 LAB — BASIC METABOLIC PANEL
Anion gap: 7 (ref 5–15)
BUN: <5 mg/dL — ABNORMAL LOW (ref 6–23)
CHLORIDE: 106 mmol/L (ref 96–112)
CO2: 23 mmol/L (ref 19–32)
CREATININE: 0.37 mg/dL — AB (ref 0.50–1.10)
Calcium: 6.9 mg/dL — ABNORMAL LOW (ref 8.4–10.5)
GFR calc Af Amer: >90 mL/min (ref >90)
GFR calc non Af Amer: >90 mL/min (ref >90)
Glucose, Bld: 93 mg/dL (ref 70–99)
Potassium: 3.6 mmol/L (ref 3.5–5.1)
SODIUM: 136 mmol/L (ref 135–145)

## 2014-08-04 LAB — MAGNESIUM: Magnesium: 1.5 mg/dL (ref 1.5–2.5)

## 2014-08-04 LAB — PHOSPHORUS: Phosphorus: 1.9 mg/dL — ABNORMAL LOW (ref 2.3–4.6)

## 2014-08-04 MED ORDER — METOPROLOL TARTRATE 25 MG PO TABS
25.0000 mg | ORAL_TABLET | Freq: Three times a day (TID) | ORAL | Status: DC
  Administered 2014-08-04 – 2014-08-06 (×8): 25 mg via ORAL
  Filled 2014-08-04 (×9): qty 1

## 2014-08-04 MED ORDER — FAMOTIDINE 20 MG PO TABS
20.0000 mg | ORAL_TABLET | Freq: Every day | ORAL | Status: DC
  Administered 2014-08-04 – 2014-08-08 (×5): 20 mg via ORAL
  Filled 2014-08-04 (×5): qty 1

## 2014-08-04 MED ORDER — DIGOXIN 125 MCG PO TABS
0.2500 mg | ORAL_TABLET | Freq: Every day | ORAL | Status: DC
  Administered 2014-08-04 – 2014-08-08 (×5): 0.25 mg via ORAL
  Filled 2014-08-04 (×7): qty 2

## 2014-08-04 MED ORDER — ENSURE ENLIVE PO LIQD
237.0000 mL | Freq: Two times a day (BID) | ORAL | Status: DC
  Administered 2014-08-04: 237 mL via ORAL

## 2014-08-04 NOTE — Progress Notes (Signed)
Patient ID: [name], female   DOB: Apr 09, 1956, 59 y.o.   MRN: [id number]    Subjective:  Some diarrhea last night  Taking PO  Objective:  Filed Vitals:   08/04/14 0500 08/04/14 0600 08/04/14 0700 08/04/14 0800  BP: 110/66 86/60 110/51 98/52  Pulse: 98 126 110 115  Temp:      TempSrc:      Resp: 20 24 21 21   Height:      Weight:      SpO2: 94% 88% 95% 98%    Intake/Output from previous day:  Intake/Output Summary (Last 24 hours) at 08/04/14 0808 Last data filed at 08/04/14 0700  Gross per 24 hour  Intake 2074.6 ml  Output    905 ml  Net 1169.6 ml    Physical Exam: Chronically ill white female CPAP on  Lungs clear anteriorly S1/S2 no murmur rub or click Mid line Abdominal scar BS possitive Right IJ and left subclavian porta cath No edema Plus 2 PT;s   Lab Results: Basic Metabolic Panel:  Recent Labs  08/03/14 0450 08/04/14 0513  NA 137 136  K 3.7 3.6  CL 106 106  CO2 24 23  GLUCOSE 126* 93  BUN 7 <5*  CREATININE 0.37* 0.37*  CALCIUM 7.1* 6.9*  MG 1.6 1.5  PHOS 1.5* 1.9*   CBC:  Recent Labs  08/03/14 0450 08/04/14 0513  WBC 3.8* 4.6  NEUTROABS 2.7 3.3  HGB 10.9* 11.8*  HCT 31.9* 34.3*  MCV 89.6 90.0  PLT 42* 52*   Cardiac Enzymes:  Recent Labs  08/01/14 1115 08/01/14 1419 08/01/14 2055  TROPONINI 0.04* 0.04* 0.04*    Imaging: Dg Abd 1 View  08/02/2014   CLINICAL DATA:  Mid abdominal pain and tenderness. Abdominal distention this morning.  EXAM: ABDOMEN - 1 VIEW  COMPARISON:  PET-CT 06/05/2014  FINDINGS: New transverse and proximal colonic gaseous distension up to 9 cm. No dilated small bowel or stomach. Cholecystectomy and left pelvic surgical clips.  IMPRESSION: Gaseous distention of colon suggesting colonic ileus.   Electronically Signed   By: Monte Fantasia M.D.   On: 08/02/2014 09:15   Dg Chest Port 1 View  08/02/2014   CLINICAL DATA:  Respiratory failure, CHF  EXAM: PORTABLE CHEST - 1 VIEW  COMPARISON:  Portable chest  x-ray of August 01, 2014  FINDINGS: The lungs are well-expanded. The interstitial markings remain increased bilaterally. The retrocardiac region is slightly less dense. The cardiac silhouette remains enlarged. The pulmonary vascularity remains engorged. No significant pleural effusion is demonstrated. The right internal jugular venous catheter tip and the left-sided Port-A-Cath appliance tip project over the mid to distal SVC.  IMPRESSION: There has not been dramatic interval change since yesterday's study. There may be slight interval clearing of left lower lobe atelectasis.   Electronically Signed   By: David  Martinique   On: 08/02/2014 09:12    Cardiac Studies:  ECG:  afib rate 124 non specific ST/T wave changes PVC  Telemetry:  Rapid afib 100-120bpm   Echo:  Pending  EF normal on echo 55% in 2014 reviewed   Medications:   . antiseptic oral rinse  7 mL Mouth Rinse q12n4p  . cefTRIAXone (ROCEPHIN)  IV  2 g Intravenous Q24H  . chlorhexidine  15 mL Mouth Rinse BID  . digoxin  0.25 mg Intravenous Daily  . famotidine (PEPCID) IV  20 mg Intravenous Q24H  . insulin aspart  0-15 Units Subcutaneous 4 times per day  . insulin aspart  0-15 Units Subcutaneous TID WC  . insulin aspart  0-5 Units Subcutaneous QHS  . insulin aspart  3 Units Subcutaneous TID WC  . letrozole  2.5 mg Oral Daily  . levothyroxine  150 mcg Oral QAC breakfast  . metoprolol tartrate  25 mg Oral BID  . sodium chloride  10-40 mL Intracatheter Q12H     . sodium chloride 10 mL/hr at 08/01/14 0600    Assessment/Plan:  Afib:  D/C esmolol metoprolol tid and oral digoxen   Echo with EF 45-50% tolerating volume to support BP On Rocephin for neutropenic sepsis Counts rebounding nicely  Jenkins Rouge 08/04/2014, 8:08 AM

## 2014-08-04 NOTE — Progress Notes (Signed)
PULMONARY / CRITICAL CARE MEDICINE   Name: GIAMARIE BUECHE MRN: [id number] DOB: [birth year]    ADMISSION DATE:  07/28/2014 CONSULTATION DATE:  07/29/14  REFERRING MD :  [facility]  CHIEF COMPLAINT:  shock  INITIAL PRESENTATION: 59 yr old s/p chemo ovarian ca and Breast Ca, shock  STUDIES:  3/19 [name] abdo>>>hepatic parenchymal disease, used which can include steatosis. Left trace fluid seen around the liver.  SIGNIFICANT EVENTS: 3/19- 8 liters given, levo ordered 3/19 - shock, line, pressors, low output 3/20- off pressors, high stool output remains 3/20 Afib(caf on coumadin) RVR amio drip 3/22 added imodium 3/22 cards consult for a fib rvr  SUBJECTIVE:  HR in 110-120's, esmolol off 3/25 Comfortable,  Has a diet ordered but not hungry for hospital food.   VITAL SIGNS: Temp:  [97.1 F (36.2 C)-99 F (37.2 C)] 97.6 F (36.4 C) (03/25 0800) Pulse Rate:  [36-136] 121 (03/25 1000) Resp:  [18-31] 24 (03/25 1000) BP: (80-126)/(49-87) 96/59 mmHg (03/25 1000) SpO2:  [88 %-98 %] 94 % (03/25 1000) HEMODYNAMICS: CVP:  [1 mmHg-9 mmHg] 4 mmHg VENTILATOR SETTINGS:   INTAKE / OUTPUT:  Intake/Output Summary (Last 24 hours) at 08/04/14 1108 Last data filed at 08/04/14 1000  Gross per 24 hour  Intake 1705.5 ml  Output    845 ml  Net  860.5 ml    PHYSICAL EXAMINATION: General:  Awake, comfortable, appropriate Neuro:  A&O non-focal Head: /AT Eyes: PERRL, EOM-I and MMM. Neck: line rt ij clean Cardiovascular: afib hr 110's Lungs:  Clear B  Abdomen:  Soft, scar healed, no r/g, NT Musculoskeletal:  No pain, no edema Skin:  No rash, warm  LABS:  CBC  Recent Labs Lab 08/02/14 0424 08/03/14 0450 08/04/14 0513  WBC 3.0* 3.8* 4.6  HGB 10.9* 10.9* 11.8*  HCT 31.2* 31.9* 34.3*  PLT PLATELET CLUMPS NOTED ON SMEAR, COUNT APPEARS DECREASED 42* 52*   Coag's  Recent Labs Lab 07/31/14 0500 08/01/14 0434 08/02/14 0424 08/03/14 0450 08/04/14 0513  APTT 31 35  --  31  --   INR  1.90* 1.63* 1.53* 1.84* 1.79*   BMET  Recent Labs Lab 08/02/14 0424 08/03/14 0450 08/04/14 0513  NA 135 137 136  K 2.9* 3.7 3.6  CL 102 106 106  CO2 24 24 23   BUN 10 7 <5*  CREATININE 0.49* 0.37* 0.37*  GLUCOSE 147* 126* 93   Electrolytes  Recent Labs Lab 08/02/14 0424 08/03/14 0450 08/04/14 0513  CALCIUM 6.6* 7.1* 6.9*  MG 1.7 1.6 1.5  PHOS 1.0* 1.5* 1.9*   Sepsis Markers  Recent Labs Lab 07/28/14 2355 07/29/14 0626 07/29/14 1318  LATICACIDVEN 5.8* 7.7* 6.0*   ABG  Recent Labs Lab 07/29/14 0824 07/30/14 0420  PHART 7.348* 7.484*  PCO2ART 21.6* 20.5*  PO2ART 81.0 96.8   Liver Enzymes  Recent Labs Lab 07/29/14 0956 07/30/14 0430 07/31/14 0500  AST 36 36 26  ALT 36* 35 34  ALKPHOS 67 55 56  BILITOT 1.3* 1.4* 1.2  ALBUMIN 2.1* 1.9* 1.9*   Cardiac Enzymes  Recent Labs Lab 08/01/14 1115 08/01/14 1419 08/01/14 2055  TROPONINI 0.04* 0.04* 0.04*   Glucose  Recent Labs Lab 08/03/14 1208 08/03/14 1607 08/03/14 2007 08/04/14 0001 08/04/14 0328 08/04/14 0757  GLUCAP 120* 173* 178* 106* 91 102*    Imaging No results found.   ASSESSMENT / PLAN:  PULMONARY  A: Stable OSA P:   pcxr improved IS per RT Cpap as at home, tolerating  CARDIOVASCULAR CVL 3/19>>> A:  Shock (septic), hypovolemia from GI loss Afib with RVR  Off coumadin due to platelets 35 Mild positive trop I, stress most likely P:  Esmolol to off Metoprolol and digoxin  Appreciate cardiology assistance    RENAL Lab Results  Component Value Date   CREATININE 0.37* 08/04/2014   CREATININE 0.37* 08/03/2014   CREATININE 0.49* 08/02/2014   CREATININE 0.7 07/28/2014   CREATININE 0.7 07/24/2014   CREATININE 0.7 07/17/2014   CREATININE 0.69 11/10/2013   CREATININE 0.63 11/01/2013   CREATININE 0.69 07/25/2013    Recent Labs Lab 08/02/14 0424 08/03/14 0450 08/04/14 0513  K 2.9* 3.7 3.6   A:  NONAG from loose stools, high risk ATN, ARF, hypovolemia, hypoK  (all should improve now that diarrhea is slowed 3/24) P:   Fluid repletion IV k repleted phos repleted Mag repleted   GASTROINTESTINAL A:  Diarrhea chemo induced (+ bacteremia). Unlikely primary colitis / gastroenteritis, r/o cirrhosis P:   Advance diet ppi IVF for insensible losses PRN fentanyl for pain will help slow gut  HEMATOLOGIC Lab Results  Component Value Date   INR 1.79* 08/04/2014   INR 1.84* 08/03/2014   INR 1.53* 08/02/2014   PROTIME 34.8* 07/24/2014   PROTIME 21.6* 07/17/2014   PROTIME 15.6* 07/10/2014    A: neutropenia and pancytopenia secondary to chemo /sepsis coagulapathic from coumadin,(resolved) Thrombocytopenia(improving) P:  neupogen x 3 days Cbc  scd  INFECTIOUS A:  neutopenic septic shock, r/o (favor) GI source (translocation), r/o line   P:   BCx 3/19>>>Klebsiella oxy ss cefepime and cipro UC 3/29>>>neg 3/21 C dif >>neg  Abx:  Cefepime 3/19>>>3/22 cipro 3/19>>>3/22 vanc 3/19>>>3/20 Flagyl 3/19>>>3/20 myco 3/19>>>3/19 Rocephin 3/22>>>  Day 7 total abx 3/25, plan duration 10 days  ENDOCRINE CBG (last 3)   Recent Labs  08/04/14 0001 08/04/14 0328 08/04/14 0757  GLUCAP 106* 91 102*   A:  R/o rel AI  P:   Cortisol 150 3/21 dc steroids cbg  NEUROLOGIC A:  Encephalaopthy septic - resolved P:   RASS goal: 0  FAMILY  - Updates: husband daily   GLOBAL > change to sdu status, follow HR on metoprolol and digoxin. If improves then can move to telemetry on 3/26   Baltazar Apo, MD, PhD 08/04/2014, 11:19 AM [facility] [phone] or if no answer [phone]

## 2014-08-04 NOTE — Progress Notes (Signed)
NUTRITION FOLLOW-UP  DOCUMENTATION CODES Per approved criteria  -Non-severe (moderate) malnutrition in the context of chronic illness -Obesity, unspecified  Pt meets criteria for moderate MALNUTRITION in the context of chronic illness as evidenced by energy intake <75% for >/=1 month and mild fluid accumulation.  INTERVENTION: Ensure Enlive po BID, each supplement provides 350 kcal and 20 grams of protein  NUTRITION DIAGNOSIS: Inadequate oral intake related to diarrhea as evidenced by poor po  Goal: Pt to meet >/= 90% of their estimated nutrition needs; not met   Monitor:  Po intake, acceptance of supplements, weight, labs, I/O's  Admitting Dx: Febrile neutropenia  ASSESSMENT: 59 y.o. female diagnosed with both breast cancer and ovarian cancer. Patient is status post Mastectomy and recent hysterectomy. Currently undergoing Letrozole oral therapy for her breast cancer. Also undergoing carboplatin Taxol chemotherapy regimen for treatment of her ovarian cancer. Patient has been complaining of chronic nausea since initiating chemotherapy. She reports vomiting once this morning; and has developed progressive diarrhea as well. Patient also was complaining of minimal appetite and very poor oral intake. She feels very dehydrated today.  3/20: Pt reports poor appetite and diarrhea d/t chemo. Pt does state that the diarrhea has improved somewhat. Pt with history having a PEG and received TF, states it was removed last year because she no longer needed it.  Pt reports her weight fluctuates between 180-200 lb.   3/25: - Diarrhea improved as of 3/24. Rectal tube dc'd. - Pt with poor po. She said that she does not like the food choices offered and has been wanting "fried foods, bbq chicken and potato chips." Pt states that she is a picky eater. Discussed snack options and healthy foods to try having on hand at home. Gave suggestions on things to order while in the hospital. - Pt said that  she likes Ensure supplements. RD to order.  - Labs reviewed  Height: Ht Readings from Last 1 Encounters:  07/29/14 _0  (1.651 m)    Weight: Wt Readings from Last 1 Encounters:  08/02/14 198 lb 13.7 oz (90.2 kg)    Wt Readings from Last 10 Encounters:  08/02/14 198 lb 13.7 oz (90.2 kg)  07/24/14 173 lb 8 oz (78.699 kg)  07/14/14 178 lb 4.8 oz (80.876 kg)  07/10/14 182 lb 4.8 oz (82.691 kg)  07/06/14 201 lb (91.173 kg)  06/27/14 201 lb 6 oz (91.343 kg)  06/23/14 201 lb 6.4 oz (91.354 kg)  06/16/14 198 lb 14.4 oz (90.22 kg)  05/15/14 195 lb (88.451 kg)  04/26/14 203 lb 3.2 oz (92.171 kg)   BMI:  Body mass index is 33.09 kg/(m^2).  Estimated Nutritional Needs: Kcal: 1900-2100 Protein: 80-90g Fluid: 1.9L/day  Skin: intact  Diet Order: Diet Carb Modified Fluid consistency:: Thin; Room service appropriate?: Yes  EDUCATION NEEDS: -No education needs identified at this time   Intake/Output Summary (Last 24 hours) at 08/04/14 1151 Last data filed at 08/04/14 1000  Gross per 24 hour  Intake 1705.5 ml  Output    845 ml  Net  860.5 ml    Last BM: 3/24  Labs:   Recent Labs Lab 08/02/14 0424 08/03/14 0450 08/04/14 0513  NA 135 137 136  K 2.9* 3.7 3.6  CL 102 106 106  CO2 _1 BUN 10 7 <5*  CREATININE 0.49* 0.37* 0.37*  CALCIUM 6.6* 7.1* 6.9*  MG 1.7 1.6 1.5  PHOS 1.0* 1.5* 1.9*  GLUCOSE 147* 126* 93    CBG (last 3)  Recent Labs  08/04/14 0001 08/04/14 0328 08/04/14 0757  GLUCAP 106* 91 102*    Scheduled Meds: . antiseptic oral rinse  7 mL Mouth Rinse q12n4p  . cefTRIAXone (ROCEPHIN)  IV  2 g Intravenous Q24H  . chlorhexidine  15 mL Mouth Rinse BID  . digoxin  0.25 mg Oral Daily  . famotidine  20 mg Oral Daily  . insulin aspart  0-15 Units Subcutaneous 4 times per day  . insulin aspart  0-15 Units Subcutaneous TID WC  . insulin aspart  0-5 Units Subcutaneous QHS  . insulin aspart  3 Units Subcutaneous TID WC  . letrozole  2.5 mg Oral  Daily  . levothyroxine  150 mcg Oral QAC breakfast  . metoprolol tartrate  25 mg Oral TID  . sodium chloride  10-40 mL Intracatheter Q12H    Continuous Infusions: . sodium chloride 10 mL/hr at 08/01/14 0600    Past Medical History  Diagnosis Date  . Depression   . DVT (deep venous thrombosis)     hx of on HRT left leg ~2006  . GERD (gastroesophageal reflux disease)   . Hyperlipidemia   . Hypertension   . Hypothyroidism   . PPD positive, treated     rx inh   . Diabetes mellitus   . 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 DJD (degenerative joint disease) of lumbar spine   . Chronic diastolic CHF (congestive heart failure)     a. 08/2012 Echo: EF 55-60%, no rwma, mod MR.  [name] A-fib     a. on amio for rate control/coumadin.  [name] Physical deconditioning   . Allergy   . Anemia     low iron hx  . Blood transfusion without reported diagnosis     2 u prbc  . Cataract     removed ou  . OSA on CPAP     cpap 4.5 setting  . Breast cancer 2014    a. 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 Tubal ligation    . Cholecystectomy    . Foot surgery    . Eye surgery    . Cataract extraction    . Percutaneous liver biopsy    . Breast biopsy      left breast  . Mastectomy modified radical  06/08/2012    Procedure: MASTECTOMY MODIFIED RADICAL;  Surgeon: Edward Jolly, MD;  Location: [facility];  Service: General;  Laterality: Right;  . Portacath placement  06/08/2012    Procedure: INSERTION PORT-A-CATH;  Surgeon: Edward Jolly, MD;  Location: [facility];  Service: General;  Laterality: Left;  . Peg tube placement      peg removed 2015  . History of chemotherapy x 2 treatments, radiation tx  2014  . Pac removed  2015  . Laparotomy N/A 06/27/2014    Procedure: EXPLORATORY LAPAROTOMY;  Surgeon: Everitt Amber, MD;   Location: [facility];  Service: Gynecology;  Laterality: N/A;  . Abdominal hysterectomy N/A 06/27/2014    Procedure: HYSTERECTOMY ABDOMINAL TOTAL;  Surgeon: Everitt Amber, MD;  Location: [facility];  Service: Gynecology;  Laterality: N/A;  . Salpingoophorectomy Bilateral 06/27/2014    Procedure: BILATERAL SALPINGO OOPHORECTOMY/TUMOR DEBULKING/LYMPHNODE DISSECTION, OMENTECTOMY;  Surgeon: Everitt Amber, MD;  Location: [facility];  Service: Gynecology;  Laterality: Bilateral;    Laurette Schimke MS,  [place], [place]

## 2014-08-04 NOTE — Progress Notes (Signed)
ANTIBIOTIC CONSULT NOTE - FOLLOW UP  Pharmacy Consult for ceftriaxone Indication: klebsiella bacteremia  Allergies  Allergen Reactions  . Olmesartan Medoxomil Cough    REACTION: ? if cough  . Tetracycline Hcl     Unknown reaction, too long for patient to remember   . Venlafaxine     REACTION: severe dry moouth  . Adhesive [Tape] Rash    Patient Measurements: Height: 5\' 5"  (165.1 cm) Weight: 198 lb 13.7 oz (90.2 kg) IBW/kg (Calculated) : 57   Vital Signs: Temp: 97.1 F (36.2 C) (03/25 0400) Temp Source: Oral (03/25 0400) BP: 98/52 mmHg (03/25 0800) Pulse Rate: 115 (03/25 0800) Intake/Output from previous day: 03/24 0701 - 03/25 0700 In: 2183.1 [P.O.:240; I.V.:1255.1; IV Piggyback:688] Out: 965 [Urine:815; Stool:150] Intake/Output from this shift:    Labs:  Recent Labs  08/02/14 0424 08/03/14 0450 08/04/14 0513  WBC 3.0* 3.8* 4.6  HGB 10.9* 10.9* 11.8*  PLT PLATELET CLUMPS NOTED ON SMEAR, COUNT APPEARS DECREASED 42* 52*  CREATININE 0.49* 0.37* 0.37*   Estimated Creatinine Clearance: 83 mL/min (by C-G formula based on Cr of 0.37). No results for input(s): VANCOTROUGH, VANCOPEAK, VANCORANDOM, GENTTROUGH, GENTPEAK, GENTRANDOM, TOBRATROUGH, TOBRAPEAK, TOBRARND, AMIKACINPEAK, AMIKACINTROU, AMIKACIN in the last 72 hours.   Microbiology: Recent Results (from the past 720 hour(s))  TECHNOLOGIST REVIEW     Status: None   Collection Time: 07/28/14  2:43 PM  Result Value Ref Range Status   Technologist Review Occasional neutrophil and lymph seen on scan   Final  Blood culture (routine single)     Status: None   Collection Time: 07/28/14  2:49 PM  Result Value Ref Range Status   Blood Culture, Routine Culture, Blood  Final    Comment: ------------------------------------------------------------------------Gram Stain Report Called to,Read Back Byand Verified With:GAIL VANDENBERG3/19/16 AT [id number] - ===== FINAL REPORT =====KLEBSIELLA   PNEUMONIAE------------------------------------------------------------------------ KLEBSIELLA PNEUMONIAE   AMPICILLIN                       MIC      Resistant            ug/ml   AMOX/CLAVULANIC                  MIC      Sensitive          4 ug/ml    AMPICILLIN/SUL                   MIC      Sensitive          4 ug/ml   PIPERACILLIN/TAZO                MIC      Sensitive        <=4 ug/ml   IMIPENEM                         MIC      Sensitive     <=0.25 ug/ml   CEFAZOLIN                        MIC       Sensitive        <=4 ug/ml   CEFTRIAXONE                      MIC      Sensitive        <=1 ug/ml   CEFTAZIDIME  MIC      Sensitive        <=1 ug/ml   CEFEPIME                         MIC      Sensitive        <=1 ug/ml   GENTAMICIN                        MIC       Sensitive        <=1 ug/ml   TOBRAMYCIN                       MIC      Sensitive        <=1 ug/ml   CIPROFLOXACIN                    MIC      Sensitive     <=0.25 ug/ml   LEVOFLOXACIN                     MIC      Sensitive      <=0.12 ug/ml   TRIMETH/SULFA                    MIC      Sensitive       <=20 ug/mlEND OF REPORT   Blood culture (routine single)     Status: None   Collection Time: 07/28/14  2:52 PM  Result Value Ref Range Status   Blood Culture, Routine Culture, Blood  Final    Comment: Final - ===== FINAL REPORT =====KLEBSIELLA PNEUMONIAE------------------------------------------------------------------------Susceptibilities performed on previous culturewithin the last 5 days.Gram Stain Report Called to,Read Back Byand Verified  With:STEPHANY BILLON RN 07/29/14 AT 850 AM BY [facility]   Culture, blood (routine x 2)     Status: None   Collection Time: 07/28/14 10:55 PM  Result Value Ref Range Status   Specimen Description BLOOD LEFT FOREARM  Final   Special Requests BOTTLES DRAWN AEROBIC ONLY 4CC  Final   Culture   Final    KLEBSIELLA OXYTOCA Note: Gram Stain Report Called to,Read Back By and Verified With: AMY  ARNOLD RN @4PM  VINCJ 07/29/14 Performed at [facility]    Report Status 07/31/2014 FINAL  Final   Organism ID, Bacteria KLEBSIELLA OXYTOCA  Final      Susceptibility   Klebsiella oxytoca - MIC*    AMPICILLIN >=32 RESISTANT Resistant     AMPICILLIN/SULBACTAM 8 SENSITIVE Sensitive     CEFAZOLIN <=4 SENSITIVE Sensitive     CEFEPIME <=1 SENSITIVE Sensitive     CEFTAZIDIME <=1 SENSITIVE Sensitive     CEFTRIAXONE <=1 SENSITIVE Sensitive     CIPROFLOXACIN <=0.25 SENSITIVE Sensitive     GENTAMICIN <=1 SENSITIVE Sensitive     IMIPENEM <=0.25 SENSITIVE Sensitive     PIP/TAZO <=4 SENSITIVE Sensitive     TOBRAMYCIN <=1 SENSITIVE Sensitive     TRIMETH/SULFA <=20 SENSITIVE Sensitive     * KLEBSIELLA OXYTOCA  Stool culture     Status: None   Collection Time: 07/29/14  1:39 AM  Result Value Ref Range Status   Specimen Description STOOL  Final   Special Requests NONE  Final   Culture   Final    NO SALMONELLA, SHIGELLA, CAMPYLOBACTER, YERSINIA, OR E.COLI 0157:H7 ISOLATED Performed at [facility]  Report Status 08/02/2014 FINAL  Final  Culture, Urine     Status: None   Collection Time: 07/29/14  6:14 AM  Result Value Ref Range Status   Specimen Description URINE, CATHETERIZED  Final   Special Requests NONE  Final   Colony Count NO GROWTH Performed at [facility]   Final   Culture NO GROWTH Performed at [facility]   Final   Report Status 07/30/2014 FINAL  Final  MRSA PCR Screening     Status: None   Collection Time: 07/29/14 10:31 AM  Result Value Ref Range Status   MRSA by PCR NEGATIVE NEGATIVE Final    Comment:        The GeneXpert MRSA Assay (FDA approved for NASAL specimens only), is one component of a comprehensive MRSA colonization surveillance program. It is not intended to diagnose MRSA infection nor to guide or monitor treatment for MRSA infections.   Clostridium Difficile by PCR     Status: None   Collection Time: 07/31/14  3:35  PM  Result Value Ref Range Status   C difficile by pcr NEGATIVE NEGATIVE Final    Anti-infectives    Start     Dose/Rate Route Frequency Ordered Stop   08/01/14 1400  cefTRIAXone (ROCEPHIN) 2 g in dextrose 5 % 50 mL IVPB - Premix     2 g 100 mL/hr over 30 Minutes Intravenous Every 24 hours 08/01/14 0925     07/30/14 0915  ciprofloxacin (CIPRO) IVPB 400 mg  Status:  Discontinued     400 mg 200 mL/hr over 60 Minutes Intravenous Every 12 hours 07/30/14 0901 08/01/14 0918   07/29/14 1200  metroNIDAZOLE (FLAGYL) IVPB 500 mg  Status:  Discontinued     500 mg 100 mL/hr over 60 Minutes Intravenous 4 times per day 07/29/14 0900 07/30/14 0821   07/29/14 1000  micafungin (MYCAMINE) 100 mg in sodium chloride 0.9 % 100 mL IVPB  Status:  Discontinued     100 mg 100 mL/hr over 1 Hours Intravenous Daily 07/29/14 0840 07/29/14 1049   07/29/14 1000  micafungin (MYCAMINE) 100 mg in sodium chloride 0.9 % 100 mL IVPB  Status:  Discontinued     100 mg 100 mL/hr over 1 Hours Intravenous Daily 07/29/14 0840 07/29/14 0850   07/29/14 1000  ciprofloxacin (CIPRO) IVPB 400 mg  Status:  Discontinued     400 mg 200 mL/hr over 60 Minutes Intravenous Every 12 hours 07/29/14 0859 07/29/14 1438   07/29/14 0900  vancomycin (VANCOCIN) 50 mg/mL oral solution 500 mg  Status:  Discontinued     500 mg Oral Every 6 hours 07/29/14 0840 07/29/14 0859   07/28/14 2200  vancomycin (VANCOCIN) IVPB 1000 mg/200 mL premix  Status:  Discontinued     1,000 mg 200 mL/hr over 60 Minutes Intravenous Every 12 hours 07/28/14 2047 07/30/14 0814   07/28/14 2200  ceFEPIme (MAXIPIME) 2 g in dextrose 5 % 50 mL IVPB  Status:  Discontinued     2 g 100 mL/hr over 30 Minutes Intravenous 3 times per day 07/28/14 2047 08/01/14 3151      Assessment: Patient is a 59 y.o F with breast and ovarian cancer currently  receiving letrozole and chemotherapy.  She presented to [facility] on 3/18 with FN and was started on broad empiric abx.  Abx now narrowed to  ceftriaxone on 3/22 for klebsiella bacteremia.  WBC 4.6, Afebrile, ANC 3.3, scr stable at 0.37  3/18 >> vanc >> 3/19 3/18 >> cefepime >>  3/22 3/19 >> micafungin x 1 3/19 >> cipro >> 3/22 3/19 >> flagyl >> 3/20 3/22 >> ceftriaxone Rx >>  3/18 blood: K oxytoca, R - Amp only 3/18 urine: NGF (U/A amber/cloudy) 3/19 stool: no suspicious colonies to date 3/19 MRSA PCR negative 3/21 C diff: negative  Goal of Therapy:  Eradication of infection  Plan:  - continue ceftriaxone 2gm IV q24h - pharmacy will sign off on abx as dose is appropriate and no renal adjustment is needed for ceftriaxone. - Please indicate LOT for abx - re-consult [name] if need further assistance  Thank you for allowing pharmacy to take part in this patient's care.   Jaeline Whobrey P 08/04/2014,8:08 AM

## 2014-08-04 NOTE — Progress Notes (Signed)
PT Cancellation Note  Patient Details Name: [name] MRN: [id number] DOB: 29-Mar-1956   Cancelled Treatment:    Reason Eval/Treat Not Completed: Fatigue/lethargy limiting ability to participate. Attempted PT eval-pt declined to participate due to fatigue. Pt requested PT check back tomorrow.    Weston Anna, MPT Pager: [phone]

## 2014-08-05 DIAGNOSIS — I4891 Unspecified atrial fibrillation: Secondary | ICD-10-CM

## 2014-08-05 DIAGNOSIS — A419 Sepsis, unspecified organism: Secondary | ICD-10-CM

## 2014-08-05 LAB — BASIC METABOLIC PANEL
Anion gap: 7 (ref 5–15)
BUN: 9 mg/dL (ref 6–23)
CHLORIDE: 104 mmol/L (ref 96–112)
CO2: 23 mmol/L (ref 19–32)
CREATININE: 0.42 mg/dL — AB (ref 0.50–1.10)
Calcium: 7.1 mg/dL — ABNORMAL LOW (ref 8.4–10.5)
GFR calc Af Amer: >90 mL/min (ref >90)
Glucose, Bld: 142 mg/dL — ABNORMAL HIGH (ref 70–99)
POTASSIUM: 3.8 mmol/L (ref 3.5–5.1)
Sodium: 134 mmol/L — ABNORMAL LOW (ref 135–145)

## 2014-08-05 LAB — CBC WITH DIFFERENTIAL/PLATELET
BASOS ABS: 0 10*3/uL (ref 0.0–0.1)
BASOS PCT: 0 % (ref 0–1)
EOS PCT: 0 % (ref 0–5)
Eosinophils Absolute: 0 10*3/uL (ref 0.0–0.7)
HEMATOCRIT: 36.3 % (ref 36.0–46.0)
HEMOGLOBIN: 12.1 g/dL (ref 12.0–15.0)
LYMPHS PCT: 14 % (ref 12–46)
Lymphs Abs: 0.7 10*3/uL (ref 0.7–4.0)
MCH: 30.2 pg (ref 26.0–34.0)
MCHC: 33.3 g/dL (ref 30.0–36.0)
MCV: 90.5 fL (ref 78.0–100.0)
MONOS PCT: 7 % (ref 3–12)
Monocytes Absolute: 0.4 10*3/uL (ref 0.1–1.0)
Neutro Abs: 4.3 10*3/uL (ref 1.7–7.7)
Neutrophils Relative %: 79 % — ABNORMAL HIGH (ref 43–77)
Platelets: 84 10*3/uL — ABNORMAL LOW (ref 150–400)
RBC: 4.01 MIL/uL (ref 3.87–5.11)
RDW: 15.7 % — ABNORMAL HIGH (ref 11.5–15.5)
WBC: 5.5 10*3/uL (ref 4.0–10.5)

## 2014-08-05 LAB — GLUCOSE, CAPILLARY
GLUCOSE-CAPILLARY: 73 mg/dL (ref 70–99)
Glucose-Capillary: 67 mg/dL — ABNORMAL LOW (ref 70–99)
Glucose-Capillary: 78 mg/dL (ref 70–99)

## 2014-08-05 LAB — MAGNESIUM: Magnesium: 1.5 mg/dL (ref 1.5–2.5)

## 2014-08-05 LAB — PROTIME-INR
INR: 1.82 — AB (ref 0.00–1.49)
Prothrombin Time: 21.2 seconds — ABNORMAL HIGH (ref 11.6–15.2)

## 2014-08-05 MED ORDER — POTASSIUM CHLORIDE 10 MEQ/50ML IV SOLN
10.0000 meq | INTRAVENOUS | Status: AC
  Administered 2014-08-05 (×2): 10 meq via INTRAVENOUS
  Filled 2014-08-05 (×2): qty 50

## 2014-08-05 MED ORDER — MAGNESIUM SULFATE 4 GM/100ML IV SOLN
4.0000 g | Freq: Once | INTRAVENOUS | Status: AC
  Administered 2014-08-05: 4 g via INTRAVENOUS
  Filled 2014-08-05: qty 100

## 2014-08-05 MED ORDER — SODIUM CHLORIDE 0.45 % IV SOLN: INTRAVENOUS | Status: DC | PRN

## 2014-08-05 NOTE — Progress Notes (Signed)
RN notified me (not Dr. Titus Mould) about VT run and frequent PVC's.  Will check electrolytes.  Continue monitoring on tele.  Chesley Mires, MD [facility] Pulmonary/Critical Care 08/05/2014, 1:13 PM Pager:  [phone] After 3pm call: [phone]

## 2014-08-05 NOTE — Progress Notes (Signed)
eLink Physician-Brief Progress Note Patient Name: [name] DOB: 02-Feb-1956 MRN: [id number]   Date of Service  08/05/2014  HPI/Events of Note  PVC's and NSVT on Telemetry. Dr. Halford Chessman ordered BMP and Magnesium level. Potassium = 3.8, Magnesium = 1.5 and Creatinine = 0.42.  eICU Interventions  Will replace Magnesium and Potassium.      Intervention Category Intermediate Interventions: Electrolyte abnormality - evaluation and management  Sommer,Steven Eugene 08/05/2014, 7:48 PM

## 2014-08-05 NOTE — Progress Notes (Signed)
Patient Name: [name]      SUBJECTIVE  Admitted 3/18 with fever   PMHx notable for ovarian and breast cancer, AF permanent on coumadin, HTN and HFpEF  We were consulted for AF RVR in context of neutropenic sepsis, now treated with metoprolol and dig, other meds limited by hypotension   Echo EF 40-45%  Coumadin had been on hold for thrombocytopenia  Feeling some better Past Medical History  Diagnosis Date  . Depression   . DVT (deep venous thrombosis)     hx of on HRT left leg ~2006  . GERD (gastroesophageal reflux disease)   . Hyperlipidemia   . Hypertension   . Hypothyroidism   . PPD positive, treated     rx inh   . Diabetes mellitus   . 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 DJD (degenerative joint disease) of lumbar spine   . Chronic diastolic CHF (congestive heart failure)     a. 08/2012 Echo: EF 55-60%, no rwma, mod MR.  [name] A-fib     a. on amio for rate control/coumadin.  [name] Physical deconditioning   . Allergy   . Anemia     low iron hx  . Blood transfusion without reported diagnosis     2 u prbc  . Cataract     removed ou  . OSA on CPAP     cpap 4.5 setting  . Breast cancer 2014    a. 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 antiseptic oral rinse  7 mL Mouth Rinse q12n4p  . cefTRIAXone (ROCEPHIN)  IV  2 g Intravenous Q24H  . chlorhexidine  15 mL Mouth Rinse BID  . digoxin  0.25 mg Oral Daily  . famotidine  20 mg Oral Daily  . feeding supplement (ENSURE ENLIVE)  237 mL Oral BID BM  . insulin aspart  0-15 Units Subcutaneous TID WC  . insulin aspart  0-5 Units Subcutaneous QHS  . insulin aspart  3 Units Subcutaneous TID WC  . letrozole  2.5 mg Oral Daily  . levothyroxine  150 mcg Oral QAC breakfast  . metoprolol tartrate  25 mg Oral TID  . sodium chloride  10-40 mL  Intracatheter Q12H   Continuous Infusions: . sodium chloride 10 mL/hr at 08/01/14 0600   acetaminophen, ALPRAZolam, fentaNYL, ondansetron **OR** ondansetron (ZOFRAN) IV, sodium chloride    PHYSICAL EXAM Filed Vitals:   08/05/14 0200 08/05/14 0400 08/05/14 0500 08/05/14 0600  BP: 89/58 98/47  90/65  Pulse: 99 103  131  Temp:  96.8 F (36 C)    TempSrc:  Axillary    Resp: 35 35  19  Height:      Weight:   206 lb 9.1 oz (93.7 kg)   SpO2: 94% 95%  96%   Well developed and nourished sickly  HENT normal Neck supple   Occ crackles Irregular rate and rhythm, no murmurs or gallops Abd-soft with active BS No Clubbing cyanosis tr edema Skin-warm and dry A & Oriented  Grossly normal sensory and motor function   TELEMETRY: Reviewed telemetry pt in *afib RVR    Intake/Output Summary (Last 24 hours) at 08/05/14 0803 Last data filed at 08/05/14 0700  Gross per 24 hour  Intake    280 ml  Output    585  ml  Net   -305 ml    LABS: Basic Metabolic Panel:  Recent Labs Lab 07/31/14 0500 07/31/14 1450  08/01/14 0434 08/01/14 1534 08/02/14 0424 08/03/14 0450 08/04/14 0513  NA 130* 131*  --  132* 132* 135 137 136  K 2.7* 3.4*  --  2.7* 3.0* 2.9* 3.7 3.6  CL 101 103  --  97 98 102 106 106  CO2 20 22  --  25 26 24 24 23   GLUCOSE 235* 178*  --  179* 170* 147* 126* 93  BUN 12 11  --  14 12 10 7  <5*  CREATININE 0.60 0.55  --  0.58 0.46* 0.49* 0.37* 0.37*  CALCIUM 6.7* 6.8*  --  6.6* 6.4* 6.6* 7.1* 6.9*  MG  --   --   --  2.1  --  1.7 1.6 1.5  PHOS  --   --   < > <1.0*  --  1.0* 1.5* 1.9*  < > = values in this interval not displayed. Cardiac Enzymes: No results for input(s): CKTOTAL, CKMB, CKMBINDEX, TROPONINI in the last 72 hours. CBC:  Recent Labs Lab 07/29/14 0956 07/30/14 0430 07/31/14 0500 08/01/14 0434 08/02/14 0424 08/03/14 0450 08/04/14 0513 08/05/14 0540  WBC 0.2* 0.1* 0.6* 2.0* 3.0* 3.8* 4.6 5.5  NEUTROABS 0.0* 0.1* 0.2*  --  2.4 2.7 3.3 4.3  HGB 12.7  10.8* 11.1* 10.6* 10.9* 10.9* 11.8* 12.1  HCT 36.5 30.9* 31.4* 30.1* 31.2* 31.9* 34.3* 36.3  MCV 88.8 87.3 86.3 87.0 87.6 89.6 90.0 90.5  PLT 74* 40* 35* 35* PLATELET CLUMPS NOTED ON SMEAR, COUNT APPEARS DECREASED 42* 52* 84*   PROTIME:  Recent Labs  08/03/14 0450 08/04/14 0513 08/05/14 0540  LABPROT 21.4* 21.0* 21.2*  INR 1.84* 1.79* 1.82*   Liver Function Tests: No results for input(s): AST, ALT, ALKPHOS, BILITOT, PROT, ALBUMIN in the last 72 hours. No results for input(s): LIPASE, AMYLASE in the last 72 hours. BNP: BNP (last 3 results) No results for input(s): BNP in the last 8760 hours.  ProBNP (last 3 results) No results for input(s): PROBNP in the last 8760 hours.  D-Dimer: No results for input(s): DDIMER in the last 72 hours. Hemoglobin A1C: No results for input(s): HGBA1C in the last 72 hours. Fasting Lipid Panel: No results for input(s): CHOL, HDL, LDLCALC, TRIG, CHOLHDL, LDLDIRECT in the last 72 hours. Thyroid Function Tests: No results for input(s): TSH, T4TOTAL, T3FREE, THYROIDAB in the last 72 hours.  Invalid input(s): FREET3 Anemia Panel: No results for input(s): VITAMINB12, FOLATE, FERRITIN, TIBC, IRON, RETICCTPCT in the last 72 hours.     ASSESSMENT AND PLAN:  Principal Problem:   Febrile neutropenia Active Problems:   Thrombocytopenia   Chronic diastolic CHF (congestive heart failure), NYHA class 1   Chronic atrial fibrillation   Diabetes mellitus type 2, uncontrolled   Hypothyroidism   Severe sepsis with septic shock   Neutropenia, drug-induced   Malnutrition of moderate degree   Elevated LFTs   Sepsis   Arterial hypotension   Hypovolemia  Still with AF and RVR  In part secondary Rate controll limited by BP but will follow for now Resume anticoagulation when possible  Plt count improving OOB as much as possible  Signed, Virl Axe MD  08/05/2014

## 2014-08-05 NOTE — Evaluation (Signed)
Physical Therapy Evaluation Patient Details Name: COURTANY MCMURPHY MRN: [id number] DOB: [birth year] Today's Date: 08/05/2014   History of Present Illness  59 yo female admitted with febrile neutropenia, sepsis. Hx of ovarian cancer, breast cancer, DM, chronic Afib, CHF, HTN, DVT, ex lap/hysterectomy 06/2014, mastectomy.   Clinical Impression  On eval, pt required Mod assist (+2 for OOB for safety)-able to perform stand pivot with RW. Limited activity this session per request of RN-pt with fluctuating HRs and rhythms. Pt is deconditioned. At this time, recommend HHPT and 24 hour supervision/assist depending on progress (may require higher level of care if mobility does not improve). Will continue to assess and progress activity as able     Follow Up Recommendations Home health PT;Supervision/Assistance - 24 hour (depending on progress)    Equipment Recommendations  Rolling walker with 5" wheels    Recommendations for Other Services OT consult     Precautions / Restrictions Precautions Precautions: Fall Restrictions Weight Bearing Restrictions: No      Mobility  Bed Mobility Overal bed mobility: Needs Assistance Bed Mobility: Supine to Sit;Sit to Supine     Supine to sit: Mod assist;HOB elevated Sit to supine: Mod assist;HOB elevated   General bed mobility comments: Assist for trunk and bil LEs. Increased time. Multimodal cues for safety, technique, hand placement  Transfers Overall transfer level: Needs assistance Equipment used: Rolling walker (2 wheeled) Transfers: Sit to/from [facility] Sit to Stand: Mod assist;From elevated surface Stand pivot transfers: Min assist;+2 physical assistance;+2 safety/equipment       General transfer comment: Assist to rise, stabilize, control descent, maneuver with RW. Multimodal cues for safety, technique, hand placement. Stand pivot x2, bed<>BSC with RW. Increased time. Steps appeared ataxic at times.  Ambulation/Gait             General Gait Details: NT- RN recommended light mobiity due to fluctuating HRs and rhythms  Stairs            Wheelchair Mobility    Modified Rankin (Stroke Patients Only)       Balance Overall balance assessment: Needs assistance;History of Falls         Standing balance support: Bilateral upper extremity supported;During functional activity Standing balance-Leahy Scale: Poor                               Pertinent Vitals/Pain Pain Assessment: No/denies pain HR fluctuating throughout session: 106bpm at start of session/at rest; as high as 130s-140s during session with activity; 100-120s end of session/at rest    Home Living Family/patient expects to be discharged to:: Private residence Living Arrangements: Spouse/significant other Available Help at Discharge: Family;Available 24 hours/day Type of Home: House Home Access: Ramped entrance     Home Layout: One level Home Equipment: Walker - 2 wheels      Prior Function           Comments: Mod Ind PTA     Hand Dominance        Extremity/Trunk Assessment   Upper Extremity Assessment: Generalized weakness           Lower Extremity Assessment: Generalized weakness      Cervical / Trunk Assessment: Kyphotic  Communication   Communication: No difficulties  Cognition Arousal/Alertness: Awake/alert Behavior During Therapy: WFL for tasks assessed/performed Overall Cognitive Status: Within Functional Limits for tasks assessed (some slowed processing but pt participates well)  General Comments      Exercises        Assessment/Plan    PT Assessment Patient needs continued PT services  PT Diagnosis Difficulty walking;Generalized weakness   PT Problem List Decreased strength;Decreased activity tolerance;Decreased balance;Decreased mobility;Decreased knowledge of use of DME;Pain;Decreased coordination;Obesity  PT Treatment Interventions  DME instruction;Gait training;Functional mobility training;Therapeutic activities;Therapeutic exercise;Patient/family education;Balance training   PT Goals (Current goals can be found in the Care Plan section) Acute Rehab PT Goals Patient Stated Goal: to get better PT Goal Formulation: With patient/family Time For Goal Achievement: 08/19/14 Potential to Achieve Goals: Good    Frequency Min 3X/week   Barriers to discharge        Co-evaluation               End of Session   Activity Tolerance: Patient limited by fatigue Patient left: in bed;with call bell/phone within reach;with family/visitor present           Time: 8756-4332 PT Time Calculation (min) (ACUTE ONLY): 18 min   Charges:   PT Evaluation $Initial PT Evaluation Tier I: 1 Procedure     PT G Codes:        Weston Anna, MPT Pager: [phone]

## 2014-08-05 NOTE — Progress Notes (Signed)
PULMONARY / CRITICAL CARE MEDICINE   Name: [name] MRN: [id number] DOB: [birth year]    ADMISSION DATE:  07/28/2014 CONSULTATION DATE:  07/29/14  REFERRING MD :  [facility]  CHIEF COMPLAINT:  shock  INITIAL PRESENTATION:  59 yo female with hx of Ovarian and Breast cancer on chemotherapy presented with nausea, vomiting, diarrhea, fever, neutropenia, and A fib with RVR.  She has hx of DM, HTN, cirrhosis from fatty liver.  SIGNIFICANT: 3/18 Admit 3/19 To ICU, started on pressors 3/20 Amiodarone gtt 3/21 Oncology consulted 3/22 Cardiology consulted 3/23 add digoxin, esmolol 3/25 change to metoprolol, off esmolol 3/26 To telemetry  STUDIES:  3/19 Abd u/s >> steatosis 3/23 Echo >> EF 45 to 50%, mod MR, PAS 35 mmHg  SUBJECTIVE:  Still feels weak, but improving.  Denies chest pain or dyspnea.    VITAL SIGNS: Temp:  [96.8 F (36 C)-98.5 F (36.9 C)] 97.4 F (36.3 C) (03/26 0800) Pulse Rate:  [51-139] 123 (03/26 0921) Resp:  [19-35] 24 (03/26 0800) BP: (87-113)/(47-69) 113/48 mmHg (03/26 0921) SpO2:  [91 %-96 %] 95 % (03/26 0800) Weight:  [206 lb 9.1 oz (93.7 kg)] 206 lb 9.1 oz (93.7 kg) (03/26 0500) INTAKE / OUTPUT:  Intake/Output Summary (Last 24 hours) at 08/05/14 1014 Last data filed at 08/05/14 1000  Gross per 24 hour  Intake    410 ml  Output    710 ml  Net   -300 ml    PHYSICAL EXAMINATION: General: pleasant Neuro: normal strength HEENT: no sinus tenderness Cardiovascular: irregular, tachycardic, Lt chest port site clean Lungs: no wheeze Abdomen:  Soft, scar healed, non tender Musculoskeletal:  1+ edema Skin: no rashes  LABS:  CBC  Recent Labs Lab 08/03/14 0450 08/04/14 0513 08/05/14 0540  WBC 3.8* 4.6 5.5  HGB 10.9* 11.8* 12.1  HCT 31.9* 34.3* 36.3  PLT 42* 52* 84*   Coag's  Recent Labs Lab 07/31/14 0500 08/01/14 0434  08/03/14 0450 08/04/14 0513 08/05/14 0540  APTT 31 35  --  31  --   --   INR 1.90* 1.63*  < > 1.84* 1.79* 1.82*  < >  = values in this interval not displayed.   BMET  Recent Labs Lab 08/02/14 0424 08/03/14 0450 08/04/14 0513  NA 135 137 136  K 2.9* 3.7 3.6  CL 102 106 106  CO2 24 24 23   BUN 10 7 <5*  CREATININE 0.49* 0.37* 0.37*  GLUCOSE 147* 126* 93   Electrolytes  Recent Labs Lab 08/02/14 0424 08/03/14 0450 08/04/14 0513  CALCIUM 6.6* 7.1* 6.9*  MG 1.7 1.6 1.5  PHOS 1.0* 1.5* 1.9*   Sepsis Markers  Recent Labs Lab 07/29/14 1318  LATICACIDVEN 6.0*   ABG  Recent Labs Lab 07/30/14 0420  PHART 7.484*  PCO2ART 20.5*  PO2ART 96.8   Liver Enzymes  Recent Labs Lab 07/30/14 0430 07/31/14 0500  AST 36 26  ALT 35 34  ALKPHOS 55 56  BILITOT 1.4* 1.2  ALBUMIN 1.9* 1.9*   Cardiac Enzymes  Recent Labs Lab 08/01/14 1115 08/01/14 1419 08/01/14 2055  TROPONINI 0.04* 0.04* 0.04*   Glucose  Recent Labs Lab 08/04/14 0328 08/04/14 0757 08/04/14 1136 08/04/14 1549 08/04/14 2216 08/05/14 0021  GLUCAP 91 102* 95 85 73 78    Imaging No results found.   ASSESSMENT / PLAN: Rt IJ CVL 3/19 >> 3/26 BCx 3/19>>>Klebsiella oxy ss cefepime and cipro  Febrile neutropenia after chemotherapy with septic shock 2nd to Klebsiella bacteremia (likely from GI  translocation). Plan: - monitor hemodynamics - even fluid balance - day 7/10 of Abx, currently on rocephin  A fib with RVR. Acute systolic heart failure. Elevated troponin 2nd to demand ischemia. Hx of HLD, HTN. Plan: - digoxin, lopressor per cardiology - monitor on tele - resume anti-coagulation when blood counts better  Chemotherapy induced pancytopenia. Hx of Ovarian, Breast cancer. Plan: - f/u CBC - chemotherapy plans per oncology  Hx of OSA. Plan: - CPAP qhs  Nutrition. Plan: - change to regular diet  Fatty liver with reported hx of cirrhosis. Plan: - f/u LFT's  Hx of Hypothyroidism. Plan: - continue synthroid  Non anion gap acidosis, hypokalemia, acute metabolic encephalopathy >>  resolved.  Updated pt's husband at bedside.  Transfer to tele 3/26 >> to [facility] 3/27 and PCCM off.  Chesley Mires, MD [facility] Pulmonary/Critical Care 08/05/2014, 10:37 AM Pager:  [phone] After 3pm call: [phone]

## 2014-08-05 NOTE — Progress Notes (Signed)
Pt had 6 beats V tach and frequent PVCs and nonsustaining HR 140s.  Notified Dr. Titus Mould. No new orders.  Will continue to monitor.

## 2014-08-05 NOTE — Progress Notes (Signed)
Per PT request RT placed PT on PAP unit at approx 1810 with 2 lpm 02 bleed in. PT states she is comfortable at this time.

## 2014-08-05 NOTE — Progress Notes (Signed)
Pt had 5 beats V tach, run SVT, frequent PVCs.  MD notified.  New orders received. Night nurse informed.

## 2014-08-06 ENCOUNTER — Inpatient Hospital Stay (HOSPITAL_COMMUNITY): Payer: BLUE CROSS/BLUE SHIELD

## 2014-08-06 DIAGNOSIS — I4891 Unspecified atrial fibrillation: Secondary | ICD-10-CM | POA: Insufficient documentation

## 2014-08-06 DIAGNOSIS — D61810 Antineoplastic chemotherapy induced pancytopenia: Secondary | ICD-10-CM

## 2014-08-06 DIAGNOSIS — T451X5A Adverse effect of antineoplastic and immunosuppressive drugs, initial encounter: Secondary | ICD-10-CM

## 2014-08-06 LAB — BASIC METABOLIC PANEL
ANION GAP: 6 (ref 5–15)
BUN: 7 mg/dL (ref 6–23)
CALCIUM: 7 mg/dL — AB (ref 8.4–10.5)
CO2: 24 mmol/L (ref 19–32)
Chloride: 104 mmol/L (ref 96–112)
Creatinine, Ser: 0.36 mg/dL — ABNORMAL LOW (ref 0.50–1.10)
GFR calc Af Amer: >90 mL/min (ref >90)
GFR calc non Af Amer: >90 mL/min (ref >90)
Glucose, Bld: 83 mg/dL (ref 70–99)
Potassium: 3.5 mmol/L (ref 3.5–5.1)
Sodium: 134 mmol/L — ABNORMAL LOW (ref 135–145)

## 2014-08-06 LAB — CBC WITH DIFFERENTIAL/PLATELET
Basophils Absolute: 0 10*3/uL (ref 0.0–0.1)
Basophils Relative: 0 % (ref 0–1)
Eosinophils Absolute: 0 10*3/uL (ref 0.0–0.7)
Eosinophils Relative: 0 % (ref 0–5)
HEMATOCRIT: 34 % — AB (ref 36.0–46.0)
HEMOGLOBIN: 11.4 g/dL — AB (ref 12.0–15.0)
LYMPHS ABS: 0.5 10*3/uL — AB (ref 0.7–4.0)
Lymphocytes Relative: 11 % — ABNORMAL LOW (ref 12–46)
MCH: 30.3 pg (ref 26.0–34.0)
MCHC: 33.5 g/dL (ref 30.0–36.0)
MCV: 90.4 fL (ref 78.0–100.0)
MONO ABS: 0.3 10*3/uL (ref 0.1–1.0)
Monocytes Relative: 7 % (ref 3–12)
NEUTROS ABS: 3.8 10*3/uL (ref 1.7–7.7)
NEUTROS PCT: 82 % — AB (ref 43–77)
Platelets: 91 10*3/uL — ABNORMAL LOW (ref 150–400)
RBC: 3.76 MIL/uL — ABNORMAL LOW (ref 3.87–5.11)
RDW: 15.6 % — AB (ref 11.5–15.5)
WBC: 4.7 10*3/uL (ref 4.0–10.5)

## 2014-08-06 LAB — MAGNESIUM: MAGNESIUM: 2.1 mg/dL (ref 1.5–2.5)

## 2014-08-06 LAB — HEPATIC FUNCTION PANEL
ALT: 14 U/L (ref 0–35)
AST: 21 U/L (ref 0–37)
Albumin: 1.8 g/dL — ABNORMAL LOW (ref 3.5–5.2)
Alkaline Phosphatase: 106 U/L (ref 39–117)
BILIRUBIN INDIRECT: 0.5 mg/dL (ref 0.3–0.9)
Bilirubin, Direct: 0.3 mg/dL (ref 0.0–0.5)
Total Bilirubin: 0.8 mg/dL (ref 0.3–1.2)
Total Protein: 4.4 g/dL — ABNORMAL LOW (ref 6.0–8.3)

## 2014-08-06 LAB — PROTIME-INR
INR: 1.83 — ABNORMAL HIGH (ref 0.00–1.49)
PROTHROMBIN TIME: 21.3 s — AB (ref 11.6–15.2)

## 2014-08-06 LAB — GLUCOSE, CAPILLARY
GLUCOSE-CAPILLARY: 130 mg/dL — AB (ref 70–99)
Glucose-Capillary: 91 mg/dL (ref 70–99)
Glucose-Capillary: 77 mg/dL (ref 70–99)

## 2014-08-06 MED ORDER — POTASSIUM CHLORIDE CRYS ER 10 MEQ PO TBCR
30.0000 meq | EXTENDED_RELEASE_TABLET | ORAL | Status: AC
  Administered 2014-08-06 (×2): 30 meq via ORAL
  Filled 2014-08-06 (×4): qty 1

## 2014-08-06 MED ORDER — ACETAMINOPHEN 325 MG PO TABS: 650.0000 mg | ORAL_TABLET | ORAL | Status: DC | PRN

## 2014-08-06 MED ORDER — WARFARIN SODIUM 6 MG PO TABS
7.0000 mg | ORAL_TABLET | Freq: Once | ORAL | Status: AC
  Administered 2014-08-06: 7 mg via ORAL
  Filled 2014-08-06: qty 1

## 2014-08-06 MED ORDER — LOPERAMIDE HCL 2 MG PO CAPS
2.0000 mg | ORAL_CAPSULE | Freq: Three times a day (TID) | ORAL | Status: AC | PRN
  Administered 2014-08-06: 2 mg via ORAL
  Filled 2014-08-06: qty 1

## 2014-08-06 MED ORDER — FUROSEMIDE 10 MG/ML IJ SOLN
40.0000 mg | Freq: Once | INTRAMUSCULAR | Status: AC
  Administered 2014-08-06: 40 mg via INTRAVENOUS
  Filled 2014-08-06: qty 4

## 2014-08-06 MED ORDER — WARFARIN - PHARMACIST DOSING INPATIENT: Freq: Every day | Status: DC

## 2014-08-06 NOTE — Progress Notes (Addendum)
Pt wanting to keep foley catheter in until the morning, pt educated on UTI risk. MD aware.  Pt also refused to get up and sit in chair today.

## 2014-08-06 NOTE — Progress Notes (Signed)
Patient Name: [name]      SUBJECTIVE  Admitted 3/18 with fever   PMHx notable for ovarian and breast cancer, AF permanent on coumadin, HTN and HFpEF  We were consulted for AF RVR in context of neutropenic sepsis, now treated with metoprolol and dig, other meds limited by hypotension   HR much improved overnight  Echo EF 40-45%  Coumadin had been on hold for thrombocytopenia  Strips last night >>VT  I dont think so   I think this is Ashmann related aberration   Past Medical History  Diagnosis Date  . Depression   . DVT (deep venous thrombosis)     hx of on HRT left leg ~2006  . GERD (gastroesophageal reflux disease)   . Hyperlipidemia   . Hypertension   . Hypothyroidism   . PPD positive, treated     rx inh   . Diabetes mellitus   . 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 DJD (degenerative joint disease) of lumbar spine   . Chronic diastolic CHF (congestive heart failure)     a. 08/2012 Echo: EF 55-60%, no rwma, mod MR.  [name] A-fib     a. on amio for rate control/coumadin.  [name] Physical deconditioning   . Allergy   . Anemia     low iron hx  . Blood transfusion without reported diagnosis     2 u prbc  . Cataract     removed ou  . OSA on CPAP     cpap 4.5 setting  . Breast cancer 2014    a. 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 antiseptic oral rinse  7 mL Mouth Rinse q12n4p  . cefTRIAXone (ROCEPHIN)  IV  2 g Intravenous Q24H  . chlorhexidine  15 mL Mouth Rinse BID  . digoxin  0.25 mg Oral Daily  . famotidine  20 mg Oral Daily  . feeding supplement (ENSURE ENLIVE)  237 mL Oral BID BM  . letrozole  2.5 mg Oral Daily  . levothyroxine  150 mcg Oral QAC breakfast  . metoprolol tartrate  25 mg Oral TID  . sodium chloride  10-40 mL Intracatheter Q12H   Continuous Infusions:   sodium  chloride, acetaminophen, ALPRAZolam, fentaNYL, ondansetron **OR** ondansetron (ZOFRAN) IV, sodium chloride    PHYSICAL EXAM Filed Vitals:   08/05/14 1100 08/05/14 1221 08/05/14 2129 08/06/14 0518  BP: 87/49 109/63 103/57 104/61  Pulse: 99 65 95 91  Temp:  97.8 F (36.6 C) 97.3 F (36.3 C) 97.4 F (36.3 C)  TempSrc:  Oral Oral Axillary  Resp: 17 18 20 18   Height:      Weight:      SpO2: 96% 97% 98% 98%   Well developed and nourished sickly  HENT normal Neck supple   Crackles 1/2 up with decreaese BS Irregular rate and rhythm, no murmurs or gallops Abd-soft with active BS No Clubbing cyanosis tr edema Skin-warm and dry A & Oriented  Grossly normal sensory and motor function   TELEMETRY: Reviewed telemetry pt in *afib RVR    Intake/Output Summary (Last 24 hours) at 08/06/14 0809 Last data filed at 08/06/14 0519  Gross per 24 hour  Intake    250 ml  Output    675 ml  Net   -425 ml  LABS: Basic Metabolic Panel:  Recent Labs Lab 08/01/14 0434 08/01/14 1534 08/02/14 0424 08/03/14 0450 08/04/14 0513 08/05/14 1340 08/06/14 0424  NA 132* 132* 135 137 136 134* 134*  K 2.7* 3.0* 2.9* 3.7 3.6 3.8 3.5  CL 97 98 102 106 106 104 104  CO2 25 26 24 24 23 23 24   GLUCOSE 179* 170* 147* 126* 93 142* 83  BUN 14 12 10 7  <5* 9 7  CREATININE 0.58 0.46* 0.49* 0.37* 0.37* 0.42* 0.36*  CALCIUM 6.6* 6.4* 6.6* 7.1* 6.9* 7.1* 7.0*  MG 2.1  --  1.7 1.6 1.5 1.5 2.1  PHOS <1.0*  --  1.0* 1.5* 1.9*  --   --    Cardiac Enzymes: No results for input(s): CKTOTAL, CKMB, CKMBINDEX, TROPONINI in the last 72 hours. CBC:  Recent Labs Lab 07/31/14 0500 08/01/14 0434 08/02/14 0424 08/03/14 0450 08/04/14 0513 08/05/14 0540 08/06/14 0424  WBC 0.6* 2.0* 3.0* 3.8* 4.6 5.5 4.7  NEUTROABS 0.2*  --  2.4 2.7 3.3 4.3 3.8  HGB 11.1* 10.6* 10.9* 10.9* 11.8* 12.1 11.4*  HCT 31.4* 30.1* 31.2* 31.9* 34.3* 36.3 34.0*  MCV 86.3 87.0 87.6 89.6 90.0 90.5 90.4  PLT 35* 35* PLATELET CLUMPS NOTED  ON SMEAR, COUNT APPEARS DECREASED 42* 52* 84* 91*   PROTIME:  Recent Labs  08/04/14 0513 08/05/14 0540 08/06/14 0424  LABPROT 21.0* 21.2* 21.3*  INR 1.79* 1.82* 1.83*   Liver Function Tests:  Recent Labs  08/06/14 0424  AST 21  ALT 14  ALKPHOS 106  BILITOT 0.8  PROT 4.4*  ALBUMIN 1.8*    ProBNP (last 3 results)    ASSESSMENT AND PLAN:  Principal Problem:   Febrile neutropenia Active Problems:   Thrombocytopenia   Chronic diastolic CHF (congestive heart failure), NYHA class 1   Chronic atrial fibrillation   Diabetes mellitus type 2, uncontrolled   Hypothyroidism   Severe sepsis with septic shock   Neutropenia, drug-induced   Malnutrition of moderate degree   Elevated LFTs   Sepsis   Arterial hypotension   Hypovolemia  Still with AF and RVR  In part secondary  But they are better in the low 100s mostly Rate control limited by BP BP better can perhaps increase metoprolol in am  Wide complex beats I dont think are VT, rather Ashman phenomenon  Resume anticoagulation when possible  Plt count improving OOB as much as possible  Some crackles bl and edema  Hands and feet Will repeat CXR and give one dose of diuretics  Signed, Virl Axe MD  08/06/2014

## 2014-08-06 NOTE — Progress Notes (Signed)
ANTICOAGULATION CONSULT NOTE - Initial Consult  Pharmacy Consult for warfarin Indication: afib, hx DVT  Allergies  Allergen Reactions  . Olmesartan Medoxomil Cough    REACTION: ? if cough  . Tetracycline Hcl     Unknown reaction, too long for patient to remember   . Venlafaxine     REACTION: severe dry moouth  . Adhesive [Tape] Rash    Patient Measurements: Height: 5\' 5"  (165.1 cm) Weight: 206 lb 9.1 oz (93.7 kg) IBW/kg (Calculated) : 57   Vital Signs: Temp: 97.8 F (36.6 C) (03/27 1510) Temp Source: Oral (03/27 1510) BP: 117/67 mmHg (03/27 1537) Pulse Rate: 117 (03/27 1510)  Labs:  Recent Labs  08/04/14 0513 08/05/14 0540 08/05/14 1340 08/06/14 0424  HGB 11.8* 12.1  --  11.4*  HCT 34.3* 36.3  --  34.0*  PLT 52* 84*  --  91*  LABPROT 21.0* 21.2*  --  21.3*  INR 1.79* 1.82*  --  1.83*  CREATININE 0.37*  --  0.42* 0.36*    Estimated Creatinine Clearance: 84.6 mL/min (by C-G formula based on Cr of 0.36).   Medical History: Past Medical History  Diagnosis Date  . Depression   . DVT (deep venous thrombosis)     hx of on HRT left leg ~2006  . GERD (gastroesophageal reflux disease)   . Hyperlipidemia   . Hypertension   . Hypothyroidism   . PPD positive, treated     rx inh   . Diabetes mellitus   . 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 DJD (degenerative joint disease) of lumbar spine   . Chronic diastolic CHF (congestive heart failure)     a. 08/2012 Echo: EF 55-60%, no rwma, mod MR.  [name] A-fib     a. on amio for rate control/coumadin.  [name] Physical deconditioning   . Allergy   . Anemia     low iron hx  . Blood transfusion without reported diagnosis     2 u prbc  . Cataract     removed ou  . OSA on CPAP     cpap 4.5 setting  . Breast cancer 2014    a. 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 antiseptic oral rinse  7 mL Mouth Rinse q12n4p  . cefTRIAXone (ROCEPHIN)  IV  2 g Intravenous Q24H  . chlorhexidine  15 mL Mouth Rinse BID  . digoxin  0.25 mg Oral Daily  . famotidine  20 mg Oral Daily  . feeding supplement (ENSURE ENLIVE)  237 mL Oral BID BM  . letrozole  2.5 mg Oral Daily  . levothyroxine  150 mcg Oral QAC breakfast  . metoprolol tartrate  25 mg Oral TID  . sodium chloride  10-40 mL Intracatheter Q12H   Infusions:    Assessment: 59 year old female patient with history of ovarian and breast cancer on chemotherapy, DM2, HTN, cirrhosis, chronic A. fib on Coumadin, CHF presented with nausea, vomiting, diarrhea, fever, neutropenia and A. fib with RVR.   Home warfarin dose 6mg  daily except 3mg  on MWF  Warfarin on hold since 3/18, INR 6.02  2mg  Vitamin k on 3/19,another 5mg  on 3/20  Today INR 1.83, plts improved to 91, H/H low but stable  Regular diet started yesterday   Goal of Therapy:  INR 2-3  Plan:  Warfarin 7mg  po x  1 @ 1800 daily INR   [place] 08/06/2014, 4:34 PM Pager [phone]

## 2014-08-06 NOTE — Progress Notes (Signed)
PROGRESS NOTE    [name] JGO:[id number] DOB: Feb 25, 1956 DOA: 07/28/2014 PCP: [name], NP  HPI/Brief narrative 59 year old female patient with history of ovarian and breast cancer on chemotherapy, DM2, HTN, cirrhosis, chronic A. fib on Coumadin, CHF presented with nausea, vomiting, diarrhea, fever, neutropenia and A. fib with RVR. She was initially admitted by the hospitalist service but care taken over by CCM and transferred back to [facility] on 3/27.   Assessment/Plan:  Febrile neutropenia after chemotherapy with septic shock secondary to Klebsiella bacteremia likely from GI translocation - Shock resolved. - Day 8/10 IV Rocephin.  A. fib with RVR - Precipitated by acute infection. - Cardiology following. - Continue digoxin and metoprolol. - Anticoagulation had been held secondary to thrombocytopenia. Possibly resume soon.  Acute systolic CHF - Receiving a dose of IV Lasix today. - Keep Foley for additional day and then consider discontinuing.  Elevated troponin secondary to demand ischemia  Hypertension - Soft blood pressures.  HLD  Chemotherapy induced pancytopenia/ovarian & breast cancer - Received chemotherapy approximately 2 weeks ago. - Hemoglobin stable. Leukopenia resolved. Thrombocytopenia improving.  OSA - Continue nightly C Pap   Fatty liver with reported history of cirrhosis  Hypothyroid - Continue Synthroid.  Acute metabolic encephalopathy - Resolved  Hypokalemia/hypomagnesemia - Replace and follow as needed  Arrhythmia/wide complex nonsustained tachycardia on telemetry - Asymptomatic - Cardiology follow-up appreciated >do not think that this is VT. Rather Ashman phenomenon  - Replace potassium and magnesium and continue to monitor on telemetry.    Diarrhea - C. difficile PCR negative. Stool culture negative. - When necessary Imodium  Anasarca - Secondary to hypoalbuminemia, fluid resuscitation and acute systolic CHF. -  Diurese with Lasix as blood pressure tolerates.   Code Status: Full Family Communication: Discussed with spouse at bedside.  Disposition Plan: Home when medically stable.    Consultants:  CCM   Cardiology  Procedures:  Foley catheter   Antibiotics:  IV Rocephin    Subjective: Complains of swelling of limbs but no dyspnea, chest pain or cough. Requests Lasix. Nursing reported multiple episodes of diarrhea.   Objective: Filed Vitals:   08/06/14 0938 08/06/14 1131 08/06/14 1510 08/06/14 1537  BP: 104/55 105/68 91/55 117/67  Pulse: 121  117   Temp:   97.8 F (36.6 C)   TempSrc:   Oral   Resp:   18   Height:      Weight:      SpO2:   96%     Intake/Output Summary (Last 24 hours) at 08/06/14 1559 Last data filed at 08/06/14 1515  Gross per 24 hour  Intake    360 ml  Output   2200 ml  Net  -1840 ml   Filed Weights   08/01/14 0400 08/02/14 0500 08/05/14 0500  Weight: 88.4 kg (194 lb 14.2 oz) 90.2 kg (198 lb 13.7 oz) 93.7 kg (206 lb 9.1 oz)     Exam:  General exam: Moderately built and nourished middle-aged female lying comfortably supine in bed. Anasarca.  Respiratory system: diminished breath sounds in the bases but otherwise clear to auscultation. No increased work of breathing. Cardiovascular system: S1 & S2 heard, RRR. No JVD, murmurs, gallops, clicks. 2+ pitting bilateral leg edema. telemetry: A. fib with ventricular rate in the 90s-100s. Frequent episodes of nonsustained wide complex tachycardia off 7-8 beats.  Gastrointestinal system: Abdomen is nondistended, soft and nontender. Normal bowel sounds heard. Central nervous system: Alert and oriented. No focal neurological deficits. Extremities: Symmetric 5 x 5  power.   Data Reviewed: Basic Metabolic Panel:  Recent Labs Lab 08/01/14 0434  08/02/14 0424 08/03/14 0450 08/04/14 0513 08/05/14 1340 08/06/14 0424  NA 132*  < > 135 137 136 134* 134*  K 2.7*  < > 2.9* 3.7 3.6 3.8 3.5  CL 97  < > 102 106  106 104 104  CO2 25  < > 24 24 23 23 24   GLUCOSE 179*  < > 147* 126* 93 142* 83  BUN 14  < > 10 7 <5* 9 7  CREATININE 0.58  < > 0.49* 0.37* 0.37* 0.42* 0.36*  CALCIUM 6.6*  < > 6.6* 7.1* 6.9* 7.1* 7.0*  MG 2.1  --  1.7 1.6 1.5 1.5 2.1  PHOS <1.0*  --  1.0* 1.5* 1.9*  --   --   < > = values in this interval not displayed. Liver Function Tests:  Recent Labs Lab 07/31/14 0500 08/06/14 0424  AST 26 21  ALT 34 14  ALKPHOS 56 106  BILITOT 1.2 0.8  PROT 4.5* 4.4*  ALBUMIN 1.9* 1.8*   No results for input(s): LIPASE, AMYLASE in the last 168 hours. No results for input(s): AMMONIA in the last 168 hours. CBC:  Recent Labs Lab 08/02/14 0424 08/03/14 0450 08/04/14 0513 08/05/14 0540 08/06/14 0424  WBC 3.0* 3.8* 4.6 5.5 4.7  NEUTROABS 2.4 2.7 3.3 4.3 3.8  HGB 10.9* 10.9* 11.8* 12.1 11.4*  HCT 31.2* 31.9* 34.3* 36.3 34.0*  MCV 87.6 89.6 90.0 90.5 90.4  PLT PLATELET CLUMPS NOTED ON SMEAR, COUNT APPEARS DECREASED 42* 52* 84* 91*   Cardiac Enzymes:  Recent Labs Lab 07/31/14 0500 07/31/14 0814 08/01/14 1115 08/01/14 1419 08/01/14 2055  TROPONINI 0.08* 0.06* 0.04* 0.04* 0.04*   BNP (last 3 results) No results for input(s): PROBNP in the last 8760 hours. CBG:  Recent Labs Lab 08/04/14 1549 08/04/14 2216 08/05/14 0021 08/05/14 0810 08/06/14 1415  GLUCAP 85 73 78 67* 130*    Recent Results (from the past 240 hour(s))  TECHNOLOGIST REVIEW     Status: None   Collection Time: 07/28/14  2:43 PM  Result Value Ref Range Status   Technologist Review Occasional neutrophil and lymph seen on scan   Final  Blood culture (routine single)     Status: None   Collection Time: 07/28/14  2:49 PM  Result Value Ref Range Status   Blood Culture, Routine Culture, Blood  Final    Comment: ------------------------------------------------------------------------Gram Stain Report Called to,Read Back Byand Verified With:GAIL VANDENBERG3/19/16 AT [id number] - ===== FINAL REPORT  =====KLEBSIELLA  PNEUMONIAE------------------------------------------------------------------------ KLEBSIELLA PNEUMONIAE   AMPICILLIN                       MIC      Resistant            ug/ml   AMOX/CLAVULANIC                  MIC      Sensitive          4 ug/ml    AMPICILLIN/SUL                   MIC      Sensitive          4 ug/ml   PIPERACILLIN/TAZO                MIC      Sensitive        <=4 ug/ml  IMIPENEM                         MIC      Sensitive     <=0.25 ug/ml   CEFAZOLIN                        MIC       Sensitive        <=4 ug/ml   CEFTRIAXONE                      MIC      Sensitive        <=1 ug/ml   CEFTAZIDIME                      MIC      Sensitive        <=1 ug/ml   CEFEPIME                         MIC      Sensitive        <=1 ug/ml   GENTAMICIN                        MIC       Sensitive        <=1 ug/ml   TOBRAMYCIN                       MIC      Sensitive        <=1 ug/ml   CIPROFLOXACIN                    MIC      Sensitive     <=0.25 ug/ml   LEVOFLOXACIN                     MIC      Sensitive      <=0.12 ug/ml   TRIMETH/SULFA                    MIC      Sensitive       <=20 ug/mlEND OF REPORT   Blood culture (routine single)     Status: None   Collection Time: 07/28/14  2:52 PM  Result Value Ref Range Status   Blood Culture, Routine Culture, Blood  Final    Comment: Final - ===== FINAL REPORT =====KLEBSIELLA PNEUMONIAE------------------------------------------------------------------------Susceptibilities performed on previous culturewithin the last 5 days.Gram Stain Report Called to,Read Back Byand Verified  With:STEPHANY BILLON RN 07/29/14 AT 850 AM BY [facility]   Culture, blood (routine x 2)     Status: None   Collection Time: 07/28/14 10:55 PM  Result Value Ref Range Status   Specimen Description BLOOD LEFT FOREARM  Final   Special Requests BOTTLES DRAWN AEROBIC ONLY 4CC  Final   Culture   Final    KLEBSIELLA OXYTOCA Note: Gram Stain Report Called to,Read Back By and  Verified With: AMY ARNOLD RN @4PM  VINCJ 07/29/14 Performed at [facility]    Report Status 07/31/2014 FINAL  Final   Organism ID, Bacteria KLEBSIELLA OXYTOCA  Final      Susceptibility   Klebsiella oxytoca - MIC*    AMPICILLIN >=32 RESISTANT Resistant     AMPICILLIN/SULBACTAM 8 SENSITIVE Sensitive     CEFAZOLIN <=  4 SENSITIVE Sensitive     CEFEPIME <=1 SENSITIVE Sensitive     CEFTAZIDIME <=1 SENSITIVE Sensitive     CEFTRIAXONE <=1 SENSITIVE Sensitive     CIPROFLOXACIN <=0.25 SENSITIVE Sensitive     GENTAMICIN <=1 SENSITIVE Sensitive     IMIPENEM <=0.25 SENSITIVE Sensitive     PIP/TAZO <=4 SENSITIVE Sensitive     TOBRAMYCIN <=1 SENSITIVE Sensitive     TRIMETH/SULFA <=20 SENSITIVE Sensitive     * KLEBSIELLA OXYTOCA  Stool culture     Status: None   Collection Time: 07/29/14  1:39 AM  Result Value Ref Range Status   Specimen Description STOOL  Final   Special Requests NONE  Final   Culture   Final    NO SALMONELLA, SHIGELLA, CAMPYLOBACTER, YERSINIA, OR E.COLI [id number] ISOLATED Performed at [facility]    Report Status 08/02/2014 FINAL  Final  Culture, Urine     Status: None   Collection Time: 07/29/14  6:14 AM  Result Value Ref Range Status   Specimen Description URINE, CATHETERIZED  Final   Special Requests NONE  Final   Colony Count NO GROWTH Performed at [facility]   Final   Culture NO GROWTH Performed at [facility]   Final   Report Status 07/30/2014 FINAL  Final  MRSA PCR Screening     Status: None   Collection Time: 07/29/14 10:31 AM  Result Value Ref Range Status   MRSA by PCR NEGATIVE NEGATIVE Final    Comment:        The GeneXpert MRSA Assay (FDA approved for NASAL specimens only), is one component of a comprehensive MRSA colonization surveillance program. It is not intended to diagnose MRSA infection nor to guide or monitor treatment for MRSA infections.   Clostridium Difficile by PCR     Status: None   Collection  Time: 07/31/14  3:35 PM  Result Value Ref Range Status   C difficile by pcr NEGATIVE NEGATIVE Final         Studies: Dg Chest Port 1v Same Day  08/06/2014   CLINICAL DATA:  Congestive heart failure. History of right-sided breast carcinoma  EXAM: PORTABLE CHEST - 1 VIEW SAME DAY  COMPARISON:  August 02, 2014  FINDINGS: Cardiomegaly with mild pulmonary venous hypertension remains. There is interstitial edema, stable. Atelectasis in the left base with minimal left effusion remains. No new opacity. Port-A-Cath tip is at the cavoatrial junction without pneumothorax. Right jugular catheter is been removed. There are surgical clips in the right axillary region.  IMPRESSION: Evidence of a degree of congestive heart failure, stable. No new opacity. No pneumothorax.   Electronically Signed   By: Lowella Grip III M.D.   On: 08/06/2014 10:04        Scheduled Meds: . antiseptic oral rinse  7 mL Mouth Rinse q12n4p  . cefTRIAXone (ROCEPHIN)  IV  2 g Intravenous Q24H  . chlorhexidine  15 mL Mouth Rinse BID  . digoxin  0.25 mg Oral Daily  . famotidine  20 mg Oral Daily  . feeding supplement (ENSURE ENLIVE)  237 mL Oral BID BM  . letrozole  2.5 mg Oral Daily  . levothyroxine  150 mcg Oral QAC breakfast  . metoprolol tartrate  25 mg Oral TID  . sodium chloride  10-40 mL Intracatheter Q12H   Continuous Infusions:   Principal Problem:   Febrile neutropenia Active Problems:   Thrombocytopenia   Chronic diastolic CHF (congestive heart failure), NYHA class 1  Chronic atrial fibrillation   Diabetes mellitus type 2, uncontrolled   Hypothyroidism   Severe sepsis with septic shock   Neutropenia, drug-induced   Malnutrition of moderate degree   Elevated LFTs   Sepsis   Arterial hypotension   Hypovolemia    Time spent:40 minutes     Evvie Behrmann, MD, FACP, FHM. [facility] Pager [phone]  If 7PM-7AM, please contact night-coverage [email or web address] Password [email or web address] 08/06/2014,  3:59 PM    LOS: 9 days

## 2014-08-06 NOTE — Progress Notes (Signed)
Pt had an 8 beat run of vtach at 1215 and 9 beat at 1228.  VSS. MD aware. Will continue to monitor closely.

## 2014-08-06 NOTE — Progress Notes (Signed)
Pt already on CPAP when RT stopped by.  Current settings are 5 CMH20 per Pt home settings.  Pt tolerating well at this time, RT to monitor and assess as needed.

## 2014-08-07 ENCOUNTER — Other Ambulatory Visit: Payer: Self-pay

## 2014-08-07 ENCOUNTER — Other Ambulatory Visit: Payer: Self-pay | Admitting: *Deleted

## 2014-08-07 ENCOUNTER — Ambulatory Visit: Payer: Self-pay

## 2014-08-07 ENCOUNTER — Ambulatory Visit: Payer: Self-pay | Admitting: Oncology

## 2014-08-07 DIAGNOSIS — E162 Hypoglycemia, unspecified: Secondary | ICD-10-CM | POA: Insufficient documentation

## 2014-08-07 DIAGNOSIS — R601 Generalized edema: Secondary | ICD-10-CM

## 2014-08-07 LAB — COMPREHENSIVE METABOLIC PANEL
ALK PHOS: 116 U/L (ref 39–117)
ALT: 14 U/L (ref 0–35)
AST: 24 U/L (ref 0–37)
Albumin: 1.7 g/dL — ABNORMAL LOW (ref 3.5–5.2)
Anion gap: 8 (ref 5–15)
BUN: 6 mg/dL (ref 6–23)
CO2: 23 mmol/L (ref 19–32)
Calcium: 7 mg/dL — ABNORMAL LOW (ref 8.4–10.5)
Chloride: 103 mmol/L (ref 96–112)
Creatinine, Ser: 0.44 mg/dL — ABNORMAL LOW (ref 0.50–1.10)
GFR calc Af Amer: >90 mL/min (ref >90)
GFR calc non Af Amer: >90 mL/min (ref >90)
Glucose, Bld: 72 mg/dL (ref 70–99)
Potassium: 3.5 mmol/L (ref 3.5–5.1)
SODIUM: 134 mmol/L — AB (ref 135–145)
TOTAL PROTEIN: 4.4 g/dL — AB (ref 6.0–8.3)
Total Bilirubin: 0.9 mg/dL (ref 0.3–1.2)

## 2014-08-07 LAB — GLUCOSE, CAPILLARY
GLUCOSE-CAPILLARY: 81 mg/dL (ref 70–99)
Glucose-Capillary: 64 mg/dL — ABNORMAL LOW (ref 70–99)
Glucose-Capillary: 82 mg/dL (ref 70–99)
Glucose-Capillary: 77 mg/dL (ref 70–99)
Glucose-Capillary: 74 mg/dL (ref 70–99)

## 2014-08-07 LAB — CBC WITH DIFFERENTIAL/PLATELET
Basophils Absolute: 0 10*3/uL (ref 0.0–0.1)
Basophils Relative: 0 % (ref 0–1)
EOS PCT: 0 % (ref 0–5)
Eosinophils Absolute: 0 10*3/uL (ref 0.0–0.7)
HEMATOCRIT: 32.8 % — AB (ref 36.0–46.0)
Hemoglobin: 11 g/dL — ABNORMAL LOW (ref 12.0–15.0)
Lymphocytes Relative: 13 % (ref 12–46)
Lymphs Abs: 0.6 10*3/uL — ABNORMAL LOW (ref 0.7–4.0)
MCH: 30.6 pg (ref 26.0–34.0)
MCHC: 33.5 g/dL (ref 30.0–36.0)
MCV: 91.1 fL (ref 78.0–100.0)
MONO ABS: 0.4 10*3/uL (ref 0.1–1.0)
MONOS PCT: 8 % (ref 3–12)
Neutro Abs: 3.8 10*3/uL (ref 1.7–7.7)
Neutrophils Relative %: 79 % — ABNORMAL HIGH (ref 43–77)
Platelets: 90 10*3/uL — ABNORMAL LOW (ref 150–400)
RBC: 3.6 MIL/uL — AB (ref 3.87–5.11)
RDW: 15.7 % — AB (ref 11.5–15.5)
WBC: 4.8 10*3/uL (ref 4.0–10.5)

## 2014-08-07 LAB — PROTIME-INR
INR: 2 — ABNORMAL HIGH (ref 0.00–1.49)
Prothrombin Time: 22.9 seconds — ABNORMAL HIGH (ref 11.6–15.2)

## 2014-08-07 MED ORDER — SACCHAROMYCES BOULARDII 250 MG PO CAPS
250.0000 mg | ORAL_CAPSULE | Freq: Two times a day (BID) | ORAL | Status: DC
  Administered 2014-08-07 – 2014-08-08 (×3): 250 mg via ORAL
  Filled 2014-08-07 (×3): qty 1

## 2014-08-07 MED ORDER — FUROSEMIDE 20 MG PO TABS
20.0000 mg | ORAL_TABLET | Freq: Every day | ORAL | Status: DC
  Administered 2014-08-07 – 2014-08-08 (×2): 20 mg via ORAL
  Filled 2014-08-07 (×2): qty 1

## 2014-08-07 MED ORDER — WARFARIN SODIUM 6 MG PO TABS
6.0000 mg | ORAL_TABLET | Freq: Once | ORAL | Status: AC
  Administered 2014-08-07: 6 mg via ORAL
  Filled 2014-08-07: qty 1

## 2014-08-07 MED ORDER — METOPROLOL TARTRATE 50 MG PO TABS
50.0000 mg | ORAL_TABLET | Freq: Two times a day (BID) | ORAL | Status: DC
  Administered 2014-08-07 – 2014-08-08 (×3): 50 mg via ORAL
  Filled 2014-08-07 (×3): qty 1

## 2014-08-07 MED ORDER — POTASSIUM CHLORIDE CRYS ER 20 MEQ PO TBCR
40.0000 meq | EXTENDED_RELEASE_TABLET | Freq: Once | ORAL | Status: AC
  Administered 2014-08-07: 40 meq via ORAL
  Filled 2014-08-07: qty 2

## 2014-08-07 NOTE — Progress Notes (Addendum)
CRITICAL VALUE ALERT  Critical value received:  Blood glucose  Date of notification:  3/28  Time of notification:  0745  Critical value read back:Yes.    Nurse who received alert:  Wess Botts  MD notified (1st page):  [place]ong[place]gi  Time of first page:  0820  MD notified (2nd page):  Time of second page:  Responding MD:  Hongalgi  Time MD responded:    Pt blood glucose 64 this am, gave 15 gram carb, sugar back up to 82.

## 2014-08-07 NOTE — Evaluation (Signed)
Occupational Therapy Evaluation Patient Details Name: ESMIRNA RAVAN MRN: [id number] DOB: 1955-06-10 Today's Date: 08/07/2014    History of Present Illness 59 yo female admitted with febrile neutropenia, sepsis. Hx of ovarian cancer, breast cancer, DM, chronic Afib, CHF, HTN, DVT, ex lap/hysterectomy 06/2014, mastectomy.    Clinical Impression   Pt up for functional transfers with walker and for standing to perform hygiene. Husband present. Pt with limited standing tolerance and can only tolerate brief standing to perform hygiene after functional transfers earlier in session. Will follow to progress ADL.    Follow Up Recommendations  Home health OT;Supervision/Assistance - 24 hour    Equipment Recommendations  3 in 1 bedside comode    Recommendations for Other Services       Precautions / Restrictions Precautions Precautions: Fall Precaution Comments: monitor HR Restrictions Weight Bearing Restrictions: No      Mobility Bed Mobility Overal bed mobility: Needs Assistance Bed Mobility: Supine to Sit     Supine to sit: Mod assist;HOB elevated     General bed mobility comments: Assist for trunk and bil LEs. Increased time. Multimodal cues for safety, technique, hand placement and for pt to do as much of the task as she can unassisted.  Transfers Overall transfer level: Needs assistance Equipment used: Rolling walker (2 wheeled) Transfers: Sit to/from Stand Sit to Stand: Min assist;From elevated surface         General transfer comment: Assist to rise, stabilize, control descent, maneuver with RW. Multimodal cues for safety, technique, hand placement. Sit to stand x 2 (once from elevated bed and once from recliner    Balance                                            ADL   Eating/Feeding: Independent;Sitting   Grooming: Wash/dry hands;Set up;Sitting   Upper Body Bathing: Sitting;Minimal assitance Upper Body Bathing Details (indicate cue  type and reason): with lines Lower Body Bathing: Maximal assistance;Sit to/from stand   Upper Body Dressing : Minimal assistance;Sitting   Lower Body Dressing: Total assistance;Sit to/from stand   Toilet Transfer: +2 for safety/equipment;Minimal assistance;Ambulation;RW   Toileting- Clothing Manipulation and Hygiene: Moderate assistance;Sit to/from stand         General ADL Comments: Pt declined standing at the sink to groom and stated she had already washed face and brush teeth. Took a walk in hallway and had seated rest break before walking back to room. Pt needing to perform periarea hygiene as she had small BM spot on pad in chair. Pt encouraged to stand and assist with washing her periarea but pt stating she didnt feel she could let go of the walker. With some more encouragement pt did wash some and nursing tech assisted with making sure she was thoroughly clean with another wash. Needs cues for hand placement.      Vision     Perception     Praxis      Pertinent Vitals/Pain Pain Assessment: No/denies pain     Hand Dominance     Extremity/Trunk Assessment Upper Extremity Assessment Upper Extremity Assessment: Generalized weakness           Communication Communication Communication: No difficulties   Cognition Arousal/Alertness: Awake/alert Behavior During Therapy: WFL for tasks assessed/performed Overall Cognitive Status: Within Functional Limits for tasks assessed  General Comments       Exercises       Shoulder Instructions      Home Living Family/patient expects to be discharged to:: Private residence Living Arrangements: Spouse/significant other Available Help at Discharge: Family;Available 24 hours/day Type of Home: House Home Access: Ramped entrance     Home Layout: One level               Home Equipment: Walker - 2 wheels          Prior Functioning/Environment Level of Independence: Needs assistance     ADL's / Homemaking Assistance Needed: spouse assists with LB dressing including socks and pants.   Comments: Mod Ind PTA    OT Diagnosis: Generalized weakness   OT Problem List: Decreased strength;Decreased knowledge of use of DME or AE;Decreased activity tolerance   OT Treatment/Interventions: Self-care/ADL training;Patient/family education;Therapeutic activities;DME and/or AE instruction    OT Goals(Current goals can be found in the care plan section) Acute Rehab OT Goals Patient Stated Goal: to get better OT Goal Formulation: With patient/family Time For Goal Achievement: 08/21/14 Potential to Achieve Goals: Good  OT Frequency: Min 2X/week   Barriers to D/C:            Co-evaluation              End of Session Equipment Utilized During Treatment: Gait belt;Rolling walker  Activity Tolerance: Patient limited by fatigue;Other (comment) (some fatigue) Patient left: in chair;with call bell/phone within reach;with family/visitor present   Time: 5997-7414 OT Time Calculation (min): 29 min Charges:  OT General Charges $OT Visit: 1 Procedure OT Evaluation $Initial OT Evaluation Tier I: 1 Procedure G-Codes:    Jules Schick  [phone] 08/07/2014, 12:59 PM

## 2014-08-07 NOTE — Progress Notes (Signed)
[name]   DOB:01/27/1956   VU#:[id number]   NZV#:[id number]  Subjective: [name] is very lively today, fantasizing about eating some [name] chicken barbecue, waiting for Rehab to help her get OOB and try to walk. Husband [name] in room   Objective: middle aged White woman examined in bed Filed Vitals:   08/07/14 0456  BP: 101/64  Pulse: 86  Temp: 97.6 F (36.4 C)  Resp: 18    Body mass index is 33.51 kg/(m^2).  Intake/Output Summary (Last 24 hours) at 08/07/14 0824 Last data filed at 08/07/14 0457  Gross per 24 hour  Intake    580 ml  Output   2400 ml  Net  -1820 ml     Sclerae unicteric  No peripheral adenopathy  Lungs clear -- auscultated anterolaterally  Heart rapid regular rate  Abdomen soft, NT, +BS  MSK 1+ bilateral ankle edema  Neuro nonfocal. Well-oriented, positive affect  Breast exam: deferred  CBG (last 3)   Recent Labs  08/06/14 2050 08/07/14 0734 08/07/14 0804  GLUCAP 77 64* 82     Labs:  Lab Results  Component Value Date   WBC 4.8 08/07/2014   HGB 11.0* 08/07/2014   HCT 32.8* 08/07/2014   MCV 91.1 08/07/2014   PLT 90* 08/07/2014   NEUTROABS 3.8 08/07/2014    _0 @  Urine Studies No results for input(s): UHGB, CRYS in the last 72 hours.  Invalid input(s): UACOL, UAPR, USPG, UPH, UTP, UGL, UKET, UBIL, UNIT, UROB, [place], UEPI, UWBC, Duwayne Heck [place], [place]  Basic Metabolic Panel:  Recent Labs Lab 08/01/14 0434  08/02/14 0424 08/03/14 0450 08/04/14 0513 08/05/14 1340 08/06/14 0424 08/07/14 0405  NA 132*  < > 135 137 136 134* 134* 134*  K 2.7*  < > 2.9* 3.7 3.6 3.8 3.5 3.5  CL 97  < > 102 106 106 104 104 103  CO2 25  < > _1 GLUCOSE 179*  < > 147* 126* 93 142* 83 72  BUN 14  < > 10 7 <5* _2 CREATININE 0.58  < > 0.49* 0.37* 0.37* 0.42* 0.36* 0.44*  CALCIUM 6.6*  < > 6.6* 7.1* 6.9* 7.1* 7.0* 7.0*  MG 2.1  --  1.7 1.6 1.5 1.5 2.1  --   PHOS <1.0*  --  1.0* 1.5* 1.9*  --   --   --   < > =  values in this interval not displayed. GFR Estimated Creatinine Clearance: 83.6 mL/min (by C-G formula based on Cr of 0.44). Liver Function Tests:  Recent Labs Lab 08/06/14 0424 08/07/14 0405  AST 21 24  ALT 14 14  ALKPHOS 106 116  BILITOT 0.8 0.9  PROT 4.4* 4.4*  ALBUMIN 1.8* 1.7*   No results for input(s): LIPASE, AMYLASE in the last 168 hours. No results for input(s): AMMONIA in the last 168 hours. Coagulation profile  Recent Labs Lab 08/03/14 0450 08/04/14 0513 08/05/14 0540 08/06/14 0424 08/07/14 0405  INR 1.84* 1.79* 1.82* 1.83* 2.00*    CBC:  Recent Labs Lab 08/03/14 0450 08/04/14 0513 08/05/14 0540 08/06/14 0424 08/07/14 0405  WBC 3.8* 4.6 5.5 4.7 4.8  NEUTROABS 2.7 3.3 4.3 3.8 3.8  HGB 10.9* 11.8* 12.1 11.4* 11.0*  HCT 31.9* 34.3* 36.3 34.0* 32.8*  MCV 89.6 90.0 90.5 90.4 91.1  PLT 42* 52* 84* 91* 90*   Cardiac Enzymes:  Recent Labs Lab 08/01/14 1115 08/01/14 1419 08/01/14 2055  TROPONINI 0.04* 0.04* 0.04*  BNP: Invalid input(s): POCBNP CBG:  Recent Labs Lab 08/06/14 1415 08/06/14 1810 08/06/14 2050 08/07/14 0734 08/07/14 0804  GLUCAP 130* 91 77 64* 82   D-Dimer No results for input(s): DDIMER in the last 72 hours. Hgb A1c No results for input(s): HGBA1C in the last 72 hours. Lipid Profile No results for input(s): CHOL, HDL, LDLCALC, TRIG, CHOLHDL, LDLDIRECT in the last 72 hours. Thyroid function studies No results for input(s): TSH, T4TOTAL, T3FREE, THYROIDAB in the last 72 hours.  Invalid input(s): FREET3 Anemia work up No results for input(s): VITAMINB12, FOLATE, FERRITIN, TIBC, IRON, RETICCTPCT in the last 72 hours. Microbiology Recent Results (from the past 240 hour(s))  TECHNOLOGIST REVIEW     Status: None   Collection Time: 07/28/14  2:43 PM  Result Value Ref Range Status   Technologist Review Occasional neutrophil and lymph seen on scan   Final  Blood culture (routine single)     Status: None   Collection Time:  07/28/14  2:49 PM  Result Value Ref Range Status   Blood Culture, Routine Culture, Blood  Final    Comment: ------------------------------------------------------------------------Gram Stain Report Called to,Read Back Byand Verified With:GAIL VANDENBERG3/19/16 AT [id number] - ===== FINAL REPORT =====KLEBSIELLA  PNEUMONIAE------------------------------------------------------------------------ KLEBSIELLA PNEUMONIAE   AMPICILLIN                       MIC      Resistant            ug/ml   AMOX/CLAVULANIC                  MIC      Sensitive          4 ug/ml    AMPICILLIN/SUL                   MIC      Sensitive          4 ug/ml   PIPERACILLIN/TAZO                MIC      Sensitive        <=4 ug/ml   IMIPENEM                         MIC      Sensitive     <=0.25 ug/ml   CEFAZOLIN                        MIC       Sensitive        <=4 ug/ml   CEFTRIAXONE                      MIC      Sensitive        <=1 ug/ml   CEFTAZIDIME                      MIC      Sensitive        <=1 ug/ml   CEFEPIME                         MIC      Sensitive        <=1 ug/ml   GENTAMICIN                        MIC  Sensitive        <=1 ug/ml   TOBRAMYCIN                       MIC      Sensitive        <=1 ug/ml   CIPROFLOXACIN                    MIC      Sensitive     <=0.25 ug/ml   LEVOFLOXACIN                     MIC      Sensitive      <=0.12 ug/ml   TRIMETH/SULFA                    MIC      Sensitive       <=20 ug/mlEND OF REPORT   Blood culture (routine single)     Status: None   Collection Time: 07/28/14  2:52 PM  Result Value Ref Range Status   Blood Culture, Routine Culture, Blood  Final    Comment: Final - ===== FINAL REPORT =====KLEBSIELLA PNEUMONIAE------------------------------------------------------------------------Susceptibilities performed on previous culturewithin the last 5 days.Gram Stain Report Called to,Read Back Byand Verified  With:STEPHANY BILLON RN 07/29/14 AT 850 AM BY [facility]   Culture, blood  (routine x 2)     Status: None   Collection Time: 07/28/14 10:55 PM  Result Value Ref Range Status   Specimen Description BLOOD LEFT FOREARM  Final   Special Requests BOTTLES DRAWN AEROBIC ONLY 4CC  Final   Culture   Final    KLEBSIELLA OXYTOCA Note: Gram Stain Report Called to,Read Back By and Verified With: AMY ARNOLD RN _0  [facility] 07/29/14 Performed at [facility]    Report Status 07/31/2014 FINAL  Final   Organism ID, Bacteria KLEBSIELLA OXYTOCA  Final      Susceptibility   Klebsiella oxytoca - MIC*    AMPICILLIN >=32 RESISTANT Resistant     AMPICILLIN/SULBACTAM 8 SENSITIVE Sensitive     CEFAZOLIN <=4 SENSITIVE Sensitive     CEFEPIME <=1 SENSITIVE Sensitive     CEFTAZIDIME <=1 SENSITIVE Sensitive     CEFTRIAXONE <=1 SENSITIVE Sensitive     CIPROFLOXACIN <=0.25 SENSITIVE Sensitive     GENTAMICIN <=1 SENSITIVE Sensitive     IMIPENEM <=0.25 SENSITIVE Sensitive     PIP/TAZO <=4 SENSITIVE Sensitive     TOBRAMYCIN <=1 SENSITIVE Sensitive     TRIMETH/SULFA <=20 SENSITIVE Sensitive     * KLEBSIELLA OXYTOCA  Stool culture     Status: None   Collection Time: 07/29/14  1:39 AM  Result Value Ref Range Status   Specimen Description STOOL  Final   Special Requests NONE  Final   Culture   Final    NO SALMONELLA, SHIGELLA, CAMPYLOBACTER, YERSINIA, OR E.COLI 0157:[id number] ISOLATED Performed at [facility]    Report Status 08/02/2014 FINAL  Final  Culture, Urine     Status: None   Collection Time: 07/29/14  6:14 AM  Result Value Ref Range Status   Specimen Description URINE, CATHETERIZED  Final   Special Requests NONE  Final   Colony Count NO GROWTH Performed at [facility]   Final   Culture NO GROWTH Performed at [facility]   Final   Report Status 07/30/2014 FINAL  Final  MRSA PCR Screening     Status: None   Collection  Time: 07/29/14 10:31 AM  Result Value Ref Range Status   MRSA by PCR NEGATIVE NEGATIVE Final    Comment:        The GeneXpert  MRSA Assay (FDA approved for NASAL specimens only), is one component of a comprehensive MRSA colonization surveillance program. It is not intended to diagnose MRSA infection nor to guide or monitor treatment for MRSA infections.   Clostridium Difficile by PCR     Status: None   Collection Time: 07/31/14  3:35 PM  Result Value Ref Range Status   C difficile by pcr NEGATIVE NEGATIVE Final      Studies:  Dg Chest Port 1v Same Day  08/06/2014   CLINICAL DATA:  Congestive heart failure. History of right-sided breast carcinoma  EXAM: PORTABLE CHEST - 1 VIEW SAME DAY  COMPARISON:  August 02, 2014  FINDINGS: Cardiomegaly with mild pulmonary venous hypertension remains. There is interstitial edema, stable. Atelectasis in the left base with minimal left effusion remains. No new opacity. Port-A-Cath tip is at the cavoatrial junction without pneumothorax. Right jugular catheter is been removed. There are surgical clips in the right axillary region.  IMPRESSION: Evidence of a degree of congestive heart failure, stable. No new opacity. No pneumothorax.   Electronically Signed   By: Lowella Grip III M.D.   On: 08/06/2014 10:04    Assessment: 59 y.o. [name] with a history of  BREAST CANCER (1) status post right mastectomy 06/08/2012 for a pT3, pN1a, stage IIIA invasive ductal carcinoma, grade 2, estrogen and progesterone receptor positive, HER-2/neu negative, with an MIB-1 of 20%.   (2) treated in the adjuvant setting with docetaxel/ cyclophosphamide given every 3 weeks. There were multiple and severe complications, and the patient tolerated only two cycles, last dose 07/29/2012  (3) letrozole started 08/16/2012 (a) osteopenia, with a T score of -1.14 April 2013  (4) postmastectomy radiation completed 12/24/2012  (5) comorbidities include diabetes, hypertension, chronic liver disease with cirrhosis and fatty liver, and nutritional disturbance with temporarily  dependence on PEG feeds (discontinued July 2014).  OVARIAN CANCER (6) restaging studies January 2016 show (a) 8 mm and 4 mm Right middle lobe lung nodules, not metabolically active on PET -- will require follow-up (b) hypermetabolic 96.2 cm mixed solid/cystic lesion in upper pelvis, with associated adenopathy  (7) status post exploratory laparotomy 06/27/2014 with total abdominal hysterectomy, bilateral salpingo-oophorectomy, para-aortic lymphadenectomy, omentectomy and radical tumor debulking for a clear cell ovarian cancer, pT1c pN1, stage IIIC .  (8) adjuvant chemotherapy consisting of carboplatin and paclitaxel to be given weekly days 1 and 8 of each 21 day cycle, for 6-8 cycles planned, started 07/17/2014, with onpro support  (a) chemotherapy interrupted after day 8 cycle 1 (07/17/2014) because of klebsiella sepsis/ febrile neutropenia    Plan: she is currently day 22 from her lst chemotherapy dose. Counts have mostly recovered (platelets 90K is adequate). Coumadin has been resumed and hospitalist service/ cardiology are working to optimize heart function, fluid balance and other supportive care aspects.  I have not discussed longer-term plans with her and her husband at this point. We will not be able to continue the chemotherapy she was to receive (which is close to the minimum likely to give her any chance of long-term remission). Options will be to resume chemo when she fully recovers, wait until relapse (at which time there would be no expectation of cure) or decide on no chemo now or later and start palliative care upon relapse. We will clarify these choices  when she returns to see me as outpatient.  She is very motivated to get OOB, walk, and get back home! She has a follow-up appt scheduled with [name] for 08/14/2014 AM  I greatly appreciate your help to this patient.   Chauncey Cruel, MD 08/07/2014  8:24 AM Medical Oncology and Hematology [facility] [address] [place], [place] [address] Tel. [phone]    Fax. [phone]

## 2014-08-07 NOTE — Progress Notes (Signed)
    Subjective:  Denies CP or dyspnea   Objective:  Filed Vitals:   08/06/14 1510 08/06/14 1537 08/06/14 2045 08/07/14 0456  BP: 91/55 117/67 100/73 101/64  Pulse: 117  101 86  Temp: 97.8 F (36.6 C)  98.4 F (36.9 C) 97.6 F (36.4 C)  TempSrc: Oral  Axillary Oral  Resp: 18  18 18   Height:      Weight:    201 lb 6.4 oz (91.354 kg)  SpO2: 96%  98% 99%    Intake/Output from previous day:  Intake/Output Summary (Last 24 hours) at 08/07/14 0739 Last data filed at 08/07/14 0457  Gross per 24 hour  Intake    580 ml  Output   2400 ml  Net  -1820 ml    Physical Exam: Physical exam: Well-developed chronically ill appearing in no acute distress.  Skin is warm and dry.  HEENT is normal.  Neck is supple.  Chest with minimal basilar crackles Cardiovascular exam is irregular and tachycardic Abdominal exam nontender or distended. No masses palpated. Extremities show 1+ ankle edema. neuro grossly intact    Lab Results: Basic Metabolic Panel:  Recent Labs  08/05/14 1340 08/06/14 0424 08/07/14 0405  NA 134* 134* 134*  K 3.8 3.5 3.5  CL 104 104 103  CO2 23 24 23   GLUCOSE 142* 83 72  BUN 9 7 6   CREATININE 0.42* 0.36* 0.44*  CALCIUM 7.1* 7.0* 7.0*  MG 1.5 2.1  --    CBC:  Recent Labs  08/06/14 0424 08/07/14 0405  WBC 4.7 4.8  NEUTROABS 3.8 3.8  HGB 11.4* 11.0*  HCT 34.0* 32.8*  MCV 90.4 91.1  PLT 91* 90*     Assessment/Plan:  1 permanent atrial fibrillation-heart rate remains elevated. Most likely driven by acute illness. I will change metoprolol to 50 mg by mouth twice a day. Continue digoxin. Patient now back on Coumadin. 2 acute on chronic diastolic congestive heart failure-patient is mildly volume overloaded. There may also be a contribution from low albumin. I will add Lasix 20 mg daily. This was her home dose. 3 sepsis secondary to Klebsiella-continue antibiotics. 4 ovarian/breast cancer-management per oncology. Patient needs physical therapy and  mobilization. Kirk Ruths 08/07/2014, 7:39 AM

## 2014-08-07 NOTE — Progress Notes (Signed)
PROGRESS NOTE    [name] XIP:[id number] DOB: Dec 24, 1955 DOA: 07/28/2014 PCP: [name], NP  HPI/Brief narrative 59 year old female patient with history of ovarian and breast cancer on chemotherapy, DM2, HTN, cirrhosis, chronic A. fib on Coumadin, CHF presented with nausea, vomiting, diarrhea, fever, neutropenia and A. fib with RVR. She was initially admitted by the hospitalist service but care taken over by CCM and transferred back to [facility] on 3/27.   Assessment/Plan:  Febrile neutropenia after chemotherapy with septic shock secondary to Klebsiella bacteremia likely from GI translocation - Shock resolved. - On cefepime 3/18-3/21 - On IV Rocephin 3/22 >>(making it day 7- error in yesterday's note). Makes it almost 10 days of antibiotics thus far-? DC.  A. fib with RVR - Precipitated by acute infection. - Cardiology following. - Continue digoxin and metoprolol-dose increased by cardiology to 50 MG BID. - Anticoagulation had been held secondary to thrombocytopenia.  - Resumed Coumadin 6/73  Acute systolic CHF - Treated with Lasix 40 mg IV  on 3/27 - Started home dose PO Lasix 20 MG daily on 3/28  Elevated troponin secondary to demand ischemia  Hypertension - Soft blood pressures.  HLD  Chemotherapy induced pancytopenia/ovarian & breast cancer - Received chemotherapy approximately 2 weeks ago. - Hemoglobin stable. Leukopenia resolved. Thrombocytopenia improving/stable. - As per oncology, no chemotherapy until she fully recovers. - Appetite improving.  OSA - Continue nightly C Pap   Fatty liver with reported history of cirrhosis  Hypothyroid - Continue Synthroid.  Acute metabolic encephalopathy - Resolved  Hypokalemia/hypomagnesemia - Replace and follow as needed  Arrhythmia/wide complex nonsustained tachycardia on telemetry - Asymptomatic - Cardiology follow-up appreciated >do not think that this is VT. Rather Ashman phenomenon  - Replace  potassium and magnesium and continue to monitor on telemetry.    Diarrhea - C. difficile PCR negative. Stool culture negative. - When necessary Imodium. Diarrhea improved - Add probiotics.  Anasarca - Secondary to hypoalbuminemia, fluid resuscitation and acute systolic CHF. - Diurese with Lasix as blood pressure tolerates. - -1.9 L since yesterday.   Hypoglycemia  - Possibly from poor oral intake and cirrhosis. - Encourage regular diet. - Monitor closely and treat per protocol   Code Status: Full Family Communication: Discussed with spouse at bedside.  Disposition Plan: DC home possibly in the next 24-48 hours.   Consultants:  CCM   Cardiology  Procedures:  Foley catheter -DC'd 3/28   Right IJ-DC'd  Antibiotics:  IV Rocephin 3/22 >    IV cefepime 3/18-3/21    IV Cipro 3/19-3/21  Micafungin 3/191  Vancomycin 3/18-3/19  Flagyl 3/191  Subjective: Appetite improved. Ambulated with PT this morning. Body swelling decreasing. Anxious to go home. Diarrhea improved. No dyspnea or chest pain.   Objective: Filed Vitals:   08/06/14 1537 08/06/14 2045 08/07/14 0456 08/07/14 1035  BP: 117/67 100/73 101/64 103/52  Pulse:  101 86 122  Temp:  98.4 F (36.9 C) 97.6 F (36.4 C)   TempSrc:  Axillary Oral   Resp:  18 18   Height:      Weight:   91.354 kg (201 lb 6.4 oz)   SpO2:  98% 99%     Intake/Output Summary (Last 24 hours) at 08/07/14 1150 Last data filed at 08/07/14 0847  Gross per 24 hour  Intake    460 ml  Output   2400 ml  Net  -1940 ml   Filed Weights   08/02/14 0500 08/05/14 0500 08/07/14 0456  Weight: 90.2 kg (198  lb 13.7 oz) 93.7 kg (206 lb 9.1 oz) 91.354 kg (201 lb 6.4 oz)     Exam:  General exam: Moderately built and nourished middle-aged female sitting up comfortably on chair . Anasarca.  Respiratory system: clear to auscultation. No increased work of breathing. Cardiovascular system: S1 & S2 heard, RRR. No JVD, murmurs, gallops,  clicks. 2+ pitting bilateral leg edema-Decreasing . telemetry: A. fib with ventricular rate in the 90s-100s. Less frequent episodes of nonsustained wide complex tachycardia.  Gastrointestinal system: Abdomen is nondistended, soft and nontender. Normal bowel sounds heard. Central nervous system: Alert and oriented. No focal neurological deficits. Extremities: Symmetric 5 x 5 power.   Data Reviewed: Basic Metabolic Panel:  Recent Labs Lab 08/01/14 0434  08/02/14 0424 08/03/14 0450 08/04/14 0513 08/05/14 1340 08/06/14 0424 08/07/14 0405  NA 132*  < > 135 137 136 134* 134* 134*  K 2.7*  < > 2.9* 3.7 3.6 3.8 3.5 3.5  CL 97  < > 102 106 106 104 104 103  CO2 25  < > 24 24 23 23 24 23   GLUCOSE 179*  < > 147* 126* 93 142* 83 72  BUN 14  < > 10 7 <5* 9 7 6   CREATININE 0.58  < > 0.49* 0.37* 0.37* 0.42* 0.36* 0.44*  CALCIUM 6.6*  < > 6.6* 7.1* 6.9* 7.1* 7.0* 7.0*  MG 2.1  --  1.7 1.6 1.5 1.5 2.1  --   PHOS <1.0*  --  1.0* 1.5* 1.9*  --   --   --   < > = values in this interval not displayed. Liver Function Tests:  Recent Labs Lab 08/06/14 0424 08/07/14 0405  AST 21 24  ALT 14 14  ALKPHOS 106 116  BILITOT 0.8 0.9  PROT 4.4* 4.4*  ALBUMIN 1.8* 1.7*   No results for input(s): LIPASE, AMYLASE in the last 168 hours. No results for input(s): AMMONIA in the last 168 hours. CBC:  Recent Labs Lab 08/03/14 0450 08/04/14 0513 08/05/14 0540 08/06/14 0424 08/07/14 0405  WBC 3.8* 4.6 5.5 4.7 4.8  NEUTROABS 2.7 3.3 4.3 3.8 3.8  HGB 10.9* 11.8* 12.1 11.4* 11.0*  HCT 31.9* 34.3* 36.3 34.0* 32.8*  MCV 89.6 90.0 90.5 90.4 91.1  PLT 42* 52* 84* 91* 90*   Cardiac Enzymes:  Recent Labs Lab 08/01/14 1115 08/01/14 1419 08/01/14 2055  TROPONINI 0.04* 0.04* 0.04*   BNP (last 3 results) No results for input(s): PROBNP in the last 8760 hours. CBG:  Recent Labs Lab 08/06/14 1810 08/06/14 2050 08/07/14 0734 08/07/14 0804 08/07/14 1140  GLUCAP 91 77 64* 82 77    Recent Results  (from the past 240 hour(s))  TECHNOLOGIST REVIEW     Status: None   Collection Time: 07/28/14  2:43 PM  Result Value Ref Range Status   Technologist Review Occasional neutrophil and lymph seen on scan   Final  Blood culture (routine single)     Status: None   Collection Time: 07/28/14  2:49 PM  Result Value Ref Range Status   Blood Culture, Routine Culture, Blood  Final    Comment: ------------------------------------------------------------------------Gram Stain Report Called to,Read Back Byand Verified With:GAIL VANDENBERG3/19/16 AT [id number] - ===== FINAL REPORT =====KLEBSIELLA  PNEUMONIAE------------------------------------------------------------------------ KLEBSIELLA PNEUMONIAE   AMPICILLIN                       MIC      Resistant            ug/ml  AMOX/CLAVULANIC                  MIC      Sensitive          4 ug/ml    AMPICILLIN/SUL                   MIC      Sensitive          4 ug/ml   PIPERACILLIN/TAZO                MIC      Sensitive        <=4 ug/ml   IMIPENEM                         MIC      Sensitive     <=0.25 ug/ml   CEFAZOLIN                        MIC       Sensitive        <=4 ug/ml   CEFTRIAXONE                      MIC      Sensitive        <=1 ug/ml   CEFTAZIDIME                      MIC      Sensitive        <=1 ug/ml   CEFEPIME                         MIC      Sensitive        <=1 ug/ml   GENTAMICIN                        MIC       Sensitive        <=1 ug/ml   TOBRAMYCIN                       MIC      Sensitive        <=1 ug/ml   CIPROFLOXACIN                    MIC      Sensitive     <=0.25 ug/ml   LEVOFLOXACIN                     MIC      Sensitive      <=0.12 ug/ml   TRIMETH/SULFA                    MIC      Sensitive       <=20 ug/mlEND OF REPORT   Blood culture (routine single)     Status: None   Collection Time: 07/28/14  2:52 PM  Result Value Ref Range Status   Blood Culture, Routine Culture, Blood  Final    Comment: Final - ===== FINAL REPORT  =====KLEBSIELLA PNEUMONIAE------------------------------------------------------------------------Susceptibilities performed on previous culturewithin the last 5 days.Gram Stain Report Called to,Read Back Byand Verified  With:STEPHANY BILLON RN 07/29/14 AT 850 AM BY [facility]   Culture, blood (routine x 2)     Status: None   Collection Time:  07/28/14 10:55 PM  Result Value Ref Range Status   Specimen Description BLOOD LEFT FOREARM  Final   Special Requests BOTTLES DRAWN AEROBIC ONLY 4CC  Final   Culture   Final    KLEBSIELLA OXYTOCA Note: Gram Stain Report Called to,Read Back By and Verified With: AMY ARNOLD RN @4PM  VINCJ 07/29/14 Performed at [facility]    Report Status 07/31/2014 FINAL  Final   Organism ID, Bacteria KLEBSIELLA OXYTOCA  Final      Susceptibility   Klebsiella oxytoca - MIC*    AMPICILLIN >=32 RESISTANT Resistant     AMPICILLIN/SULBACTAM 8 SENSITIVE Sensitive     CEFAZOLIN <=4 SENSITIVE Sensitive     CEFEPIME <=1 SENSITIVE Sensitive     CEFTAZIDIME <=1 SENSITIVE Sensitive     CEFTRIAXONE <=1 SENSITIVE Sensitive     CIPROFLOXACIN <=0.25 SENSITIVE Sensitive     GENTAMICIN <=1 SENSITIVE Sensitive     IMIPENEM <=0.25 SENSITIVE Sensitive     PIP/TAZO <=4 SENSITIVE Sensitive     TOBRAMYCIN <=1 SENSITIVE Sensitive     TRIMETH/SULFA <=20 SENSITIVE Sensitive     * KLEBSIELLA OXYTOCA  Stool culture     Status: None   Collection Time: 07/29/14  1:39 AM  Result Value Ref Range Status   Specimen Description STOOL  Final   Special Requests NONE  Final   Culture   Final    NO SALMONELLA, SHIGELLA, CAMPYLOBACTER, YERSINIA, OR E.COLI 0157:[id number] ISOLATED Performed at [facility]    Report Status 08/02/2014 FINAL  Final  Culture, Urine     Status: None   Collection Time: 07/29/14  6:14 AM  Result Value Ref Range Status   Specimen Description URINE, CATHETERIZED  Final   Special Requests NONE  Final   Colony Count NO GROWTH Performed at [facility]    Final   Culture NO GROWTH Performed at [facility]   Final   Report Status 07/30/2014 FINAL  Final  MRSA PCR Screening     Status: None   Collection Time: 07/29/14 10:31 AM  Result Value Ref Range Status   MRSA by PCR NEGATIVE NEGATIVE Final    Comment:        The GeneXpert MRSA Assay (FDA approved for NASAL specimens only), is one component of a comprehensive MRSA colonization surveillance program. It is not intended to diagnose MRSA infection nor to guide or monitor treatment for MRSA infections.   Clostridium Difficile by PCR     Status: None   Collection Time: 07/31/14  3:35 PM  Result Value Ref Range Status   C difficile by pcr NEGATIVE NEGATIVE Final         Studies: Dg Chest Port 1v Same Day  08/06/2014   CLINICAL DATA:  Congestive heart failure. History of right-sided breast carcinoma  EXAM: PORTABLE CHEST - 1 VIEW SAME DAY  COMPARISON:  August 02, 2014  FINDINGS: Cardiomegaly with mild pulmonary venous hypertension remains. There is interstitial edema, stable. Atelectasis in the left base with minimal left effusion remains. No new opacity. Port-A-Cath tip is at the cavoatrial junction without pneumothorax. Right jugular catheter is been removed. There are surgical clips in the right axillary region.  IMPRESSION: Evidence of a degree of congestive heart failure, stable. No new opacity. No pneumothorax.   Electronically Signed   By: Lowella Grip III M.D.   On: 08/06/2014 10:04        Scheduled Meds: . antiseptic oral rinse  7 mL Mouth Rinse q12n4p  .  cefTRIAXone (ROCEPHIN)  IV  2 g Intravenous Q24H  . chlorhexidine  15 mL Mouth Rinse BID  . digoxin  0.25 mg Oral Daily  . famotidine  20 mg Oral Daily  . feeding supplement (ENSURE ENLIVE)  237 mL Oral BID BM  . furosemide  20 mg Oral Daily  . letrozole  2.5 mg Oral Daily  . levothyroxine  150 mcg Oral QAC breakfast  . metoprolol tartrate  50 mg Oral BID  . saccharomyces boulardii  250 mg Oral BID    . sodium chloride  10-40 mL Intracatheter Q12H  . warfarin  6 mg Oral ONCE-1800  . Warfarin - Pharmacist Dosing Inpatient   Does not apply q1800   Continuous Infusions:   Principal Problem:   Febrile neutropenia Active Problems:   Thrombocytopenia   Chronic diastolic CHF (congestive heart failure), NYHA class 1   Chronic atrial fibrillation   Diabetes mellitus type 2, uncontrolled   Hypothyroidism   Severe sepsis with septic shock   Neutropenia, drug-induced   Malnutrition of moderate degree   Elevated LFTs   Sepsis   Arterial hypotension   Hypovolemia   Atrial fibrillation with RVR   Antineoplastic chemotherapy induced pancytopenia    Time spent:30 minutes     Delona Clasby, MD, FACP, FHM. [facility] Pager [phone]  If 7PM-7AM, please contact night-coverage [email or web address] Password [facility] 08/07/2014, 11:50 AM    LOS: 10 days

## 2014-08-07 NOTE — Progress Notes (Signed)
Sylvia for warfarin Indication: afib, hx DVT  Allergies  Allergen Reactions  . Olmesartan Medoxomil Cough    REACTION: ? if cough  . Tetracycline Hcl     Unknown reaction, too long for patient to remember   . Venlafaxine     REACTION: severe dry moouth  . Adhesive [Tape] Rash   Patient Measurements: Height: 5\' 5"  (165.1 cm) Weight: 201 lb 6.4 oz (91.354 kg) IBW/kg (Calculated) : 57  Vital Signs: Temp: 97.6 F (36.4 C) (03/28 0456) Temp Source: Oral (03/28 0456) BP: 101/64 mmHg (03/28 0456) Pulse Rate: 86 (03/28 0456)  Labs:  Recent Labs  08/05/14 0540 08/05/14 1340 08/06/14 0424 08/07/14 0405  HGB 12.1  --  11.4* 11.0*  HCT 36.3  --  34.0* 32.8*  PLT 84*  --  91* 90*  LABPROT 21.2*  --  21.3* 22.9*  INR 1.82*  --  1.83* 2.00*  CREATININE  --  0.42* 0.36* 0.44*   Estimated Creatinine Clearance: 83.6 mL/min (by C-G formula based on Cr of 0.44).  Medical History: Past Medical History  Diagnosis Date  . Depression   . DVT (deep venous thrombosis)     hx of on HRT left leg ~2006  . GERD (gastroesophageal reflux disease)   . Hyperlipidemia   . Hypertension   . Hypothyroidism   . PPD positive, treated     rx inh   . Diabetes mellitus   . 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 DJD (degenerative joint disease) of lumbar spine   . Chronic diastolic CHF (congestive heart failure)     a. 08/2012 Echo: EF 55-60%, no rwma, mod MR.  [name] A-fib     a. on amio for rate control/coumadin.  [name] Physical deconditioning   . Allergy   . Anemia     low iron hx  . Blood transfusion without reported diagnosis     2 u prbc  . Cataract     removed ou  . OSA on CPAP     cpap 4.5 setting  . Breast cancer 2014    a. 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 antiseptic oral rinse  7  mL Mouth Rinse q12n4p  . cefTRIAXone (ROCEPHIN)  IV  2 g Intravenous Q24H  . chlorhexidine  15 mL Mouth Rinse BID  . digoxin  0.25 mg Oral Daily  . famotidine  20 mg Oral Daily  . feeding supplement (ENSURE ENLIVE)  237 mL Oral BID BM  . furosemide  20 mg Oral Daily  . letrozole  2.5 mg Oral Daily  . levothyroxine  150 mcg Oral QAC breakfast  . metoprolol tartrate  50 mg Oral BID  . potassium chloride  40 mEq Oral Once  . sodium chloride  10-40 mL Intracatheter Q12H  . Warfarin - Pharmacist Dosing Inpatient   Does not apply q1800   Assessment: 59 year old female patient with history of ovarian and breast cancer on chemotherapy, DM2, HTN, cirrhosis, chronic A. fib on Coumadin, CHF presented with nausea, vomiting, diarrhea, fever, neutropenia and A. fib with RVR. Home warfarin dose 6mg  daily except 3mg  on MWF.  INR elevated on admit 3/18 (6.02),Warfarin held, 2mg  Vitamin K on 3/19,and 5mg  on 3/20 -> INR 2.44  Warfarin resumed 3/27 with 7mg  dose  Today INR 2.0, plts improved to  90, H/H low stable  Regular diet from 3/26  Goal of Therapy:  INR 2-3  Plan:   Warfarin 6mg  today at 1800  daily INR   Minda Ditto PharmD Pager [phone] 08/07/2014, 9:03 AM

## 2014-08-07 NOTE — Progress Notes (Signed)
Physical Therapy Treatment Patient Details Name: [name] MRN: [id number] DOB: 1956/03/14 Today's Date: 08/07/2014    History of Present Illness 59 yo female admitted with febrile neutropenia, sepsis. Hx of ovarian cancer, breast cancer, DM, chronic Afib, CHF, HTN, DVT, ex lap/hysterectomy 06/2014, mastectomy.     PT Comments    Progressing with mobility. HR as high as 140s during session. Pt tolerated ambulation well-fatigues fairly quickly/easily. Encouraged pt to sit up in recliner for as long as she can tolerate daily (at least 1 hour). Also spoke with RN about importance of getting pt up and OOB as well, especially on days that PT/OT are not able to come by. Will continue to follow.  Follow Up Recommendations  Home health PT;Supervision/Assistance - 24 hour     Equipment Recommendations       Recommendations for Other Services OT consult     Precautions / Restrictions Precautions Precautions: Fall Restrictions Weight Bearing Restrictions: No    Mobility  Bed Mobility Overal bed mobility: Needs Assistance Bed Mobility: Supine to Sit     Supine to sit: Mod assist;HOB elevated     General bed mobility comments: Assist for trunk and bil LEs. Increased time. Multimodal cues for safety, technique, hand placement and for pt to do as much of the task as she can unassisted. C/o some lightheadedness once upright.  Transfers Overall transfer level: Needs assistance Equipment used: Rolling walker (2 wheeled) Transfers: Sit to/from Stand Sit to Stand: Min assist;From elevated surface         General transfer comment: Assist to rise, stabilize, control descent, maneuver with RW. Multimodal cues for safety, technique, hand placement. Sit to stand x 2 (once from elevated bed and once from recliner  Ambulation/Gait Ambulation/Gait assistance: Min assist;+2 safety/equipment Ambulation Distance (Feet): 55 Feet (x2) Assistive device: Rolling walker (2 wheeled) Gait  Pattern/deviations: Step-through pattern;Decreased stride length;Trunk flexed     General Gait Details: Assist to stabilize throughout distance. LE instability noted while ambulating. Fatigues fairly easily. Followed closely with recliner. 1 seated rest break needed between walks due to fatigue.    Stairs            Wheelchair Mobility    Modified Rankin (Stroke Patients Only)       Balance                                    Cognition Arousal/Alertness: Awake/alert Behavior During Therapy: WFL for tasks assessed/performed Overall Cognitive Status: Within Functional Limits for tasks assessed                      Exercises      General Comments        Pertinent Vitals/Pain Pain Assessment: No/denies pain    Home Living                      Prior Function            PT Goals (current goals can now be found in the care plan section) Progress towards PT goals: Progressing toward goals    Frequency  Min 3X/week    PT Plan Current plan remains appropriate    Co-evaluation             End of Session Equipment Utilized During Treatment: Gait belt Activity Tolerance: Patient tolerated treatment well Patient left: in chair;with call bell/phone within reach;with  family/visitor present     Time: 4970-2637 PT Time Calculation (min) (ACUTE ONLY): 18 min  Charges:  $Gait Training: 8-22 mins                    G Codes:      Weston Anna, MPT Pager: [phone]

## 2014-08-08 ENCOUNTER — Telehealth: Payer: Self-pay | Admitting: Nurse Practitioner

## 2014-08-08 LAB — CBC WITH DIFFERENTIAL/PLATELET
BASOS ABS: 0 10*3/uL (ref 0.0–0.1)
BASOS PCT: 0 % (ref 0–1)
EOS ABS: 0 10*3/uL (ref 0.0–0.7)
EOS PCT: 0 % (ref 0–5)
HEMATOCRIT: 32.5 % — AB (ref 36.0–46.0)
Hemoglobin: 10.8 g/dL — ABNORMAL LOW (ref 12.0–15.0)
Lymphocytes Relative: 12 % (ref 12–46)
Lymphs Abs: 0.6 10*3/uL — ABNORMAL LOW (ref 0.7–4.0)
MCH: 30.4 pg (ref 26.0–34.0)
MCHC: 33.2 g/dL (ref 30.0–36.0)
MCV: 91.5 fL (ref 78.0–100.0)
Monocytes Absolute: 0.5 10*3/uL (ref 0.1–1.0)
Monocytes Relative: 10 % (ref 3–12)
Neutro Abs: 3.8 10*3/uL (ref 1.7–7.7)
Neutrophils Relative %: 78 % — ABNORMAL HIGH (ref 43–77)
Platelets: 77 10*3/uL — ABNORMAL LOW (ref 150–400)
RBC: 3.55 MIL/uL — AB (ref 3.87–5.11)
RDW: 15.6 % — AB (ref 11.5–15.5)
WBC: 4.9 10*3/uL (ref 4.0–10.5)

## 2014-08-08 LAB — BASIC METABOLIC PANEL
ANION GAP: 6 (ref 5–15)
BUN: 5 mg/dL — AB (ref 6–23)
CHLORIDE: 105 mmol/L (ref 96–112)
CO2: 24 mmol/L (ref 19–32)
Calcium: 7.1 mg/dL — ABNORMAL LOW (ref 8.4–10.5)
Creatinine, Ser: 0.37 mg/dL — ABNORMAL LOW (ref 0.50–1.10)
GFR calc Af Amer: >90 mL/min (ref >90)
GFR calc non Af Amer: >90 mL/min (ref >90)
Glucose, Bld: 75 mg/dL (ref 70–99)
POTASSIUM: 3.4 mmol/L — AB (ref 3.5–5.1)
Sodium: 135 mmol/L (ref 135–145)

## 2014-08-08 LAB — PROTIME-INR
INR: 2.94 — AB (ref 0.00–1.49)
PROTHROMBIN TIME: 30.9 s — AB (ref 11.6–15.2)

## 2014-08-08 LAB — GLUCOSE, CAPILLARY
GLUCOSE-CAPILLARY: 82 mg/dL (ref 70–99)
Glucose-Capillary: 70 mg/dL (ref 70–99)

## 2014-08-08 MED ORDER — WARFARIN SODIUM 1 MG PO TABS
1.0000 mg | ORAL_TABLET | Freq: Once | ORAL | Status: DC
  Filled 2014-08-08: qty 1

## 2014-08-08 MED ORDER — FAMOTIDINE 20 MG PO TABS: 20.0000 mg | ORAL_TABLET | Freq: Every day | ORAL | Status: DC

## 2014-08-08 MED ORDER — WARFARIN SODIUM 3 MG PO TABS: 3.0000 mg | ORAL_TABLET | Freq: Every day | ORAL | Status: DC

## 2014-08-08 MED ORDER — LOPERAMIDE HCL 2 MG PO TABS: 2.0000 mg | ORAL_TABLET | Freq: Three times a day (TID) | ORAL | Status: DC | PRN

## 2014-08-08 MED ORDER — METOPROLOL TARTRATE 50 MG PO TABS
50.0000 mg | ORAL_TABLET | Freq: Two times a day (BID) | ORAL | Status: DC
Start: 2014-08-08 — End: 2014-08-25

## 2014-08-08 MED ORDER — ONDANSETRON HCL 8 MG PO TABS: 8.0000 mg | ORAL_TABLET | Freq: Three times a day (TID) | ORAL | Status: DC | PRN

## 2014-08-08 MED ORDER — ENSURE ENLIVE PO LIQD: 237.0000 mL | Freq: Two times a day (BID) | ORAL | Status: DC

## 2014-08-08 MED ORDER — WARFARIN SODIUM 1 MG PO TABS
1.0000 mg | ORAL_TABLET | Freq: Once | ORAL | Status: AC
  Administered 2014-08-08: 1 mg via ORAL
  Filled 2014-08-08: qty 1

## 2014-08-08 MED ORDER — HEPARIN SOD (PORK) LOCK FLUSH 100 UNIT/ML IV SOLN
500.0000 [IU] | INTRAVENOUS | Status: DC | PRN
  Administered 2014-08-08: 500 [IU]
  Filled 2014-08-08 (×2): qty 5

## 2014-08-08 MED ORDER — DIGOXIN 250 MCG PO TABS: 0.2500 mg | ORAL_TABLET | Freq: Every day | ORAL | Status: DC

## 2014-08-08 MED ORDER — SULFAMETHOXAZOLE-TRIMETHOPRIM 800-160 MG PO TABS: 1.0000 | ORAL_TABLET | Freq: Two times a day (BID) | ORAL | Status: DC

## 2014-08-08 MED ORDER — SACCHAROMYCES BOULARDII 250 MG PO CAPS: 250.0000 mg | ORAL_CAPSULE | Freq: Two times a day (BID) | ORAL | Status: DC

## 2014-08-08 MED ORDER — HEPARIN SOD (PORK) LOCK FLUSH 100 UNIT/ML IV SOLN
500.0000 [IU] | INTRAVENOUS | Status: DC
  Filled 2014-08-08: qty 5

## 2014-08-08 MED ORDER — POTASSIUM CHLORIDE CRYS ER 20 MEQ PO TBCR
40.0000 meq | EXTENDED_RELEASE_TABLET | Freq: Once | ORAL | Status: AC
  Administered 2014-08-08: 40 meq via ORAL
  Filled 2014-08-08: qty 2

## 2014-08-08 NOTE — Progress Notes (Signed)
    Subjective:  Denies CP or dyspnea   Objective:  Filed Vitals:   08/07/14 0456 08/07/14 1035 08/07/14 1500 08/07/14 2110  BP: 101/64 103/52 100/55 107/69  Pulse: 86 122 134 74  Temp: 97.6 F (36.4 C)  99.2 F (37.3 C) 98.2 F (36.8 C)  TempSrc: Oral  Oral Oral  Resp: 18     Height:      Weight: 201 lb 6.4 oz (91.354 kg)     SpO2: 99%  99% 95%    Intake/Output from previous day:  Intake/Output Summary (Last 24 hours) at 08/08/14 0554 Last data filed at 08/08/14 0209  Gross per 24 hour  Intake    240 ml  Output    500 ml  Net   -260 ml    Physical Exam: Physical exam: Well-developed chronically ill appearing in no acute distress.  Skin is warm and dry.  HEENT is normal.  Neck is supple.  Chest CTA Cardiovascular exam is irregular Abdominal exam nontender or distended. No masses palpated. Extremities show 1+ ankle edema. neuro grossly intact    Lab Results: Basic Metabolic Panel:  Recent Labs  08/05/14 1340 08/06/14 0424 08/07/14 0405 08/08/14 0441  NA 134* 134* 134* 135  K 3.8 3.5 3.5 3.4*  CL 104 104 103 105  CO2 23 24 23 24   GLUCOSE 142* 83 72 75  BUN 9 7 6  5*  CREATININE 0.42* 0.36* 0.44* 0.37*  CALCIUM 7.1* 7.0* 7.0* 7.1*  MG 1.5 2.1  --   --    CBC:  Recent Labs  08/07/14 0405 08/08/14 0441  WBC 4.8 4.9  NEUTROABS 3.8 3.8  HGB 11.0* 10.8*  HCT 32.8* 32.5*  MCV 91.1 91.5  PLT 90* 77*     Assessment/Plan:  1 permanent atrial fibrillation-heart rate high normal. Continue metoprolol 50 mg by mouth twice a day. Continue digoxin. Patient now back on Coumadin. Will need close fu of INR following DC given poor nutritional state. 2 acute on chronic diastolic congestive heart failure-patient remains mildly volume overloaded. There may be a contribution from low albumin. Continue Lasix 20 mg daily. This was her home dose. 3 sepsis secondary to Klebsiella-continue antibiotics per primary care. 4 ovarian/breast cancer-management per  oncology. 5 Hypokalemia-supplement Patient needs continued physical therapy and mobilization. Kirk Ruths 08/08/2014, 5:54 AM

## 2014-08-08 NOTE — Discharge Instructions (Signed)
Sepsis Sepsis is a serious infection of your blood or tissues that affects your whole body. The infection that causes sepsis may be bacterial, viral, fungal, or parasitic. Sepsis may be life threatening. Sepsis can cause your blood pressure to drop. This may result in shock. Shock causes your central nervous system and your organs to stop working correctly.  RISK FACTORS Sepsis can happen in anyone, but it is more likely to happen in people who have weakened immune systems. SIGNS AND SYMPTOMS  Symptoms of sepsis can include:  Fever or low body temperature (hypothermia).  Rapid breathing (hyperventilation).  Chills.  Rapid heartbeat (tachycardia).  Confusion or light-headedness.  Trouble breathing.  Urinating much less than usual.  Cool, clammy skin or red, flushed skin.  Other problems with the heart, kidneys, or brain. DIAGNOSIS  Your health care provider will likely do tests to look for an infection, to see if the infection has spread to your blood, and to see how serious your condition is. Tests can include:  Blood tests, including cultures of your blood.  Cultures of other fluids from your body, such as:  Urine.  Pus from wounds.  Mucus coughed up from your lungs.  Urine tests other than cultures.  X-ray exams or other imaging tests. TREATMENT  Treatment will begin with elimination of the source of infection. If your sepsis is likely caused by a bacterial or fungal infection, you will be given antibiotic or antifungal medicines. You may also receive:  Oxygen.  Fluids through an IV tube.  Medicines to increase your blood pressure.  A machine to clean your blood (dialysis) if your kidneys fail.  A machine to help you breathe if your lungs fail. SEEK IMMEDIATE MEDICAL CARE IF: You get an infection or develop any of the signs and symptoms of sepsis after surgery or a hospitalization. Document Released: 01/25/2003 Document Revised: 05/03/2013 Document Reviewed:  01/03/2013 [facility] Patient Information 2015 [place], [place]. This information is not intended to replace advice given to you by your health care provider. Make sure you discuss any questions you have with your health care provider.  Atrial Fibrillation Atrial fibrillation is a type of irregular heart rhythm (arrhythmia). During atrial fibrillation, the upper chambers of the heart (atria) quiver continuously in a chaotic pattern. This causes an irregular and often rapid heart rate.  Atrial fibrillation is the result of the heart becoming overloaded with disorganized signals that tell it to beat. These signals are normally released one at a time by a part of the right atrium called the sinoatrial node. They then travel from the atria to the lower chambers of the heart (ventricles), causing the atria and ventricles to contract and pump blood as they pass. In atrial fibrillation, parts of the atria outside of the sinoatrial node also release these signals. This results in two problems. First, the atria receive so many signals that they do not have time to fully contract. Second, the ventricles, which can only receive one signal at a time, beat irregularly and out of rhythm with the atria.  There are three types of atrial fibrillation:   Paroxysmal. Paroxysmal atrial fibrillation starts suddenly and stops on its own within a week.  Persistent. Persistent atrial fibrillation lasts for more than a week. It may stop on its own or with treatment.  Permanent. Permanent atrial fibrillation does not go away. Episodes of atrial fibrillation may lead to permanent atrial fibrillation. Atrial fibrillation can prevent your heart from pumping blood normally. It increases your risk of  stroke and can lead to heart failure.  CAUSES   Heart conditions, including a heart attack, heart failure, coronary artery disease, and heart valve conditions.   Inflammation of the sac that surrounds the heart  (pericarditis).  Blockage of an artery in the lungs (pulmonary embolism).  Pneumonia or other infections.  Chronic lung disease.  Thyroid problems, especially if the thyroid is overactive (hyperthyroidism).  Caffeine, excessive alcohol use, and use of some illegal drugs.   Use of some medicines, including certain decongestants and diet pills.  Heart surgery.   Birth defects.  Sometimes, no cause can be found. When this happens, the atrial fibrillation is called lone atrial fibrillation. The risk of complications from atrial fibrillation increases if you have lone atrial fibrillation and you are age 57 years or older. RISK FACTORS  Heart failure.  Coronary artery disease.  Diabetes mellitus.   High blood pressure (hypertension).   Obesity.   Other arrhythmias.   Increased age. SIGNS AND SYMPTOMS   A feeling that your heart is beating rapidly or irregularly.   A feeling of discomfort or pain in your chest.   Shortness of breath.   Sudden light-headedness or weakness.   Getting tired easily when exercising.   Urinating more often than normal (mainly when atrial fibrillation first begins).  In paroxysmal atrial fibrillation, symptoms may start and suddenly stop. DIAGNOSIS  Your health care provider may be able to detect atrial fibrillation when taking your pulse. Your health care provider may have you take a test called an ambulatory electrocardiogram (ECG). An ECG records your heartbeat patterns over a 24-hour period. You may also have other tests, such as:  Transthoracic echocardiogram (TTE). During echocardiography, sound waves are used to evaluate how blood flows through your heart.  Transesophageal echocardiogram (TEE).  Stress test. There is more than one type of stress test. If a stress test is needed, ask your health care provider about which type is best for you.  Chest X-ray exam.  Blood tests.  Computed tomography (CT). TREATMENT   Treatment may include:  Treating any underlying conditions. For example, if you have an overactive thyroid, treating the condition may correct atrial fibrillation.  Taking medicine. Medicines may be given to control a rapid heart rate or to prevent blood clots, heart failure, or a stroke.  Having a procedure to correct the rhythm of the heart:  Electrical cardioversion. During electrical cardioversion, a controlled, low-energy shock is delivered to the heart through your skin. If you have chest pain, very low blood pressure, or sudden heart failure, this procedure may need to be done as an emergency.  Catheter ablation. During this procedure, heart tissues that send the signals that cause atrial fibrillation are destroyed.  Surgical ablation. During this surgery, thin lines of heart tissue that carry the abnormal signals are destroyed. This procedure can either be an open-heart surgery or a minimally invasive surgery. With the minimally invasive surgery, small cuts are made to access the heart instead of a large opening.  Pulmonary venous isolation. During this surgery, tissue around the veins that carry blood from the lungs (pulmonary veins) is destroyed. This tissue is thought to carry the abnormal signals. HOME CARE INSTRUCTIONS   Take medicines only as directed by your health care provider. Some medicines can make atrial fibrillation worse or recur.  If blood thinners were prescribed by your health care provider, take them exactly as directed. Too much blood-thinning medicine can cause bleeding. If you take too little, you will not  have the needed protection against stroke and other problems.  Perform blood tests at home if directed by your health care provider. Perform blood tests exactly as directed.  Quit smoking if you smoke.  Do not drink alcohol.  Do not drink caffeinated beverages such as coffee, soda, and some teas. You may drink decaffeinated coffee, soda, or tea.    Maintain a healthy weight.Do not use diet pills unless your health care provider approves. They may make heart problems worse.   Follow diet instructions as directed by your health care provider.  Exercise regularly as directed by your health care provider.  Keep all follow-up visits as directed by your health care provider. This is important. PREVENTION  The following substances can cause atrial fibrillation to recur:   Caffeinated beverages.  Alcohol.  Certain medicines, especially those used for breathing problems.  Certain herbs and herbal medicines, such as those containing ephedra or ginseng.  Illegal drugs, such as cocaine and amphetamines. Sometimes medicines are given to prevent atrial fibrillation from recurring. Proper treatment of any underlying condition is also important in helping prevent recurrence.  SEEK MEDICAL CARE IF:  You notice a change in the rate, rhythm, or strength of your heartbeat.  You suddenly begin urinating more frequently.  You tire more easily when exerting yourself or exercising. SEEK IMMEDIATE MEDICAL CARE IF:   You have chest pain, abdominal pain, sweating, or weakness.  You feel nauseous.  You have shortness of breath.  You suddenly have swollen feet and ankles.  You feel dizzy.  Your face or limbs feel numb or weak.  You have a change in your vision or speech. MAKE SURE YOU:   Understand these instructions.  Will watch your condition.  Will get help right away if you are not doing well or get worse. Document Released: 04/28/2005 Document Revised: 09/12/2013 Document Reviewed: 06/08/2012 [facility] Patient Information 2015 [place], [place]. This information is not intended to replace advice given to you by your health care provider. Make sure you discuss any questions you have with your health care provider.

## 2014-08-08 NOTE — Discharge Summary (Signed)
Physician Discharge Summary  [name] XNA:[id number] DOB: 1956/03/01 DOA: 07/28/2014  PCP: [name], NP  Admit date: 07/28/2014 Discharge date: 08/08/2014  Time spent: Greater than minutes  Recommendations for Outpatient Follow-up:  1. Debbrah Alar, NP/PCP on 08/18/2014 at 9 AM. Please assess re Psychiatry meds (see note below). [place] on 08/11/2014 at 8:30 AM for labs (CBC, CMP, PT & INR). Coumadin Mx. 3. Murray Hodgkins, PA/Cardiology on 08/29/2014 at 11:30 AM. 4. Home Health PT.  Discharge Diagnoses:  Principal Problem:   Febrile neutropenia Active Problems:   Thrombocytopenia   Chronic diastolic CHF (congestive heart failure), NYHA class 1   Chronic atrial fibrillation   Diabetes mellitus type 2, uncontrolled   Hypothyroidism   Severe sepsis with septic shock   Neutropenia, drug-induced   Malnutrition of moderate degree   Elevated LFTs   Sepsis   Arterial hypotension   Hypovolemia   Atrial fibrillation with RVR   Antineoplastic chemotherapy induced pancytopenia   Anasarca   Hypoglycemia   Discharge Condition: Improved & Stable  Diet recommendation: Regular diet  Filed Weights   08/05/14 0500 08/07/14 0456 08/08/14 0602  Weight: 93.7 kg (206 lb 9.1 oz) 91.354 kg (201 lb 6.4 oz) 87.726 kg (193 lb 6.4 oz)    History of present illness:  59 year old female patient with history of ovarian and breast cancer on chemotherapy, DM2, HTN, cirrhosis, chronic A. fib on Coumadin, CHF presented with nausea, vomiting, diarrhea, fever, neutropenia and A. fib with RVR. She was initially admitted by the hospitalist service but care taken over by CCM and transferred back to [facility] on 3/27.  Hospital Course:   Febrile neutropenia after chemotherapy with septic shock secondary to Klebsiella bacteremia likely from GI translocation - Shock resolved. - On cefepime 3/18-3/21 - On IV Rocephin 3/22 >> - Discussed Abx regimen on day of DC with  Infectious Disease MD on call who recommended an additional 3 days of Bactrim DS to complete total 14 days Rx and such short course of Bactrim should not worsen Thrombocytopenia.  A. fib with RVR - Precipitated by acute infection. - Cardiology consulted - Continue digoxin and metoprolol-dose increased by cardiology to 50 MG BID. - Anticoagulation had been held secondary to thrombocytopenia. She received Vitamin K for INR 6.2 on 07/28/14 - Resumed Coumadin 3/27. - INR 2.94. Placed on reduced Coumadin dose and will need close OP monitoring and Mx.  Acute systolic CHF - Treated with Lasix 40 mg IV  on 3/27 - Started home dose PO Lasix 20 MG daily on 3/28 - Still significantly volume overloaded but diuresing well and no dyspnea reported.  Elevated troponin secondary to demand ischemia  Hypertension - Soft blood pressures but stable  HLD  Chemotherapy induced pancytopenia/ovarian & breast cancer - Received chemotherapy approximately 2 weeks ago. - Hemoglobin stable. Leukopenia resolved. Thrombocytopenia improving/stable. - As per oncology, no chemotherapy until she fully recovers. - Appetite improving.  OSA - Continue nightly C Pap  - No hypoxia  Fatty liver with reported history of cirrhosis  Hypothyroid - Continue Synthroid.  Acute metabolic encephalopathy - Resolved - DC'ed several psychiatric medications- not sure how many of these she was actually taking. There was duplication of benzodiazepines. - OP follow up with PCP who may consider resuming some of these as deemed necessary.  Hypokalemia/hypomagnesemia - Replace and follow as needed  Arrhythmia/wide complex nonsustained tachycardia on telemetry - Asymptomatic - Cardiology follow-up appreciated >do not think that this is VT. Rather Ashman phenomenon  -  Replace potassium and magnesium.  Diarrhea - C. difficile PCR negative. Stool culture negative. - When necessary Imodium. Diarrhea improved - Added  probiotics.  Anasarca - Secondary to hypoalbuminemia, fluid resuscitation and acute systolic CHF. - Diurese with Lasix as blood pressure tolerates. - Expected to gradually improve.  Hypoglycemia  - Possibly from poor oral intake and cirrhosis. - Encourage regular diet. - Advised to monitor closely at home - DC'ed Metformin.  Consultations:  CCM  Cardiology  Medical Oncology  Procedures:  Foley Catheter  R IJ  Discharge Exam:  Complaints:  Anxious and asking to go home. Denies complaints. Urinating well. Body swelling decreasing. No SOB. Diarrhea decreased. Appetite improving.  Filed Vitals:   08/07/14 2110 08/08/14 0602 08/08/14 0938 08/08/14 1008  BP: 107/69 112/64 110/67   Pulse: 74 71  108  Temp: 98.2 F (36.8 C) 97.7 F (36.5 C)    TempSrc: Oral Oral    Resp:  18    Height:      Weight:  87.726 kg (193 lb 6.4 oz)    SpO2: 95% 94%      General exam: Moderately built and nourished middle-aged female sitting up comfortably on chair . Anasarca.  Respiratory system: clear to auscultation. No increased work of breathing. Cardiovascular system: S1 & S2 heard, RRR. No JVD, murmurs, gallops, clicks. 2+ pitting bilateral leg edema-Decreasing . telemetry: A. fib with ventricular rate in the 90s-100s. Less frequent episodes of nonsustained wide complex tachycardia.  Gastrointestinal system: Abdomen is nondistended, soft and nontender. Normal bowel sounds heard. Central nervous system: Alert and oriented. No focal neurological deficits. Extremities: Symmetric 5 x 5 power.  Discharge Instructions  Discharge Instructions    (HEART FAILURE PATIENTS) Call MD:  Anytime you have any of the following symptoms: 1) 3 pound weight gain in 24 hours or 5 pounds in 1 week 2) shortness of breath, with or without a dry hacking cough 3) swelling in the hands, feet or stomach 4) if you have to sleep on extra pillows at night in order to breathe.    Complete by:  As directed       Call MD for:  difficulty breathing, headache or visual disturbances    Complete by:  As directed      Call MD for:  extreme fatigue    Complete by:  As directed      Call MD for:  persistant dizziness or light-headedness    Complete by:  As directed      Call MD for:  persistant nausea and vomiting    Complete by:  As directed      Call MD for:  redness, tenderness, or signs of infection (pain, swelling, redness, odor or green/yellow discharge around incision site)    Complete by:  As directed      Call MD for:  severe uncontrolled pain    Complete by:  As directed      Call MD for:  temperature >100.4    Complete by:  As directed      Diet general    Complete by:  As directed      Discharge instructions    Complete by:  As directed   Please check fingerstick blood sugar before each meal and at bedtime.     Increase activity slowly    Complete by:  As directed             Medication List    STOP taking these medications  buPROPion 75 MG tablet  Commonly known as:  WELLBUTRIN     clonazePAM 0.5 MG tablet  Commonly known as:  KLONOPIN     dexamethasone 4 MG tablet  Commonly known as:  DECADRON     enoxaparin 120 MG/0.8ML injection  Commonly known as:  LOVENOX     escitalopram 20 MG tablet  Commonly known as:  LEXAPRO     LORazepam 0.5 MG tablet  Commonly known as:  ATIVAN     metFORMIN 500 MG tablet  Commonly known as:  GLUCOPHAGE     OLANZapine 2.5 MG tablet  Commonly known as:  ZYPREXA     omeprazole 20 MG capsule  Commonly known as:  PRILOSEC     prochlorperazine 10 MG tablet  Commonly known as:  COMPAZINE     spironolactone 25 MG tablet  Commonly known as:  ALDACTONE      TAKE these medications        ALPRAZolam 1 MG 24 hr tablet  Commonly known as:  XANAX XR  Take 1 tablet (1 mg total) by mouth 2 (two) times daily.     digoxin 0.25 MG tablet  Commonly known as:  LANOXIN  Take 1 tablet (0.25 mg total) by mouth daily.     famotidine 20  MG tablet  Commonly known as:  PEPCID  Take 1 tablet (20 mg total) by mouth daily.     feeding supplement (ENSURE ENLIVE) Liqd  Take 237 mLs by mouth 2 (two) times daily between meals.     furosemide 20 MG tablet  Commonly known as:  LASIX  Take 1 tablet (20 mg total) by mouth daily.     letrozole 2.5 MG tablet  Commonly known as:  FEMARA  Take 1 tablet (2.5 mg total) by mouth daily.     levothyroxine 150 MCG tablet  Commonly known as:  SYNTHROID, LEVOTHROID  Take 1 tablet (150 mcg total) by mouth daily.     lidocaine-prilocaine cream  Commonly known as:  EMLA  Apply over port area 1-2 hours before chemotherapy     loperamide 2 MG tablet  Commonly known as:  IMODIUM A-D  Take 1 tablet (2 mg total) by mouth 3 (three) times daily as needed for diarrhea or loose stools.     metoprolol 50 MG tablet  Commonly known as:  LOPRESSOR  Take 1 tablet (50 mg total) by mouth 2 (two) times daily.     ondansetron 8 MG tablet  Commonly known as:  ZOFRAN  Take 1 tablet (8 mg total) by mouth every 8 (eight) hours as needed for nausea or vomiting.     potassium chloride 10 MEQ tablet  Commonly known as:  K-DUR  Take 10 mEq by mouth 2 (two) times daily.     saccharomyces boulardii 250 MG capsule  Commonly known as:  FLORASTOR  Take 1 capsule (250 mg total) by mouth 2 (two) times daily.     sulfamethoxazole-trimethoprim 800-160 MG per tablet  Commonly known as:  BACTRIM DS,SEPTRA DS  Take 1 tablet by mouth 2 (two) times daily.     warfarin 3 MG tablet  Commonly known as:  COUMADIN  Take 1 tablet (3 mg total) by mouth daily at 6 PM. Or as directed by coumadin clinic.           Follow-up Information    Follow up with [name], NP. Go on 08/18/2014.   Specialty:  Internal Medicine   Why:  at 9:00am  For Post Hospitalization Follow up.   Contact information:   [place] [id number] [phone]       Follow up with [facility]. Go on 08/11/2014.   Why:  at 08:30am  For Labs (CBC, CMP, PT & INR) on 08/11/14 for Coumadin Mx      Follow up with Nahser, Wonda Cheng, MD. Go on 08/29/2014.   Specialty:  Cardiology   Why:  at 11:30aqm    With Leroy Sea, PA, for Post Hospitalization Follow up and CHF.   Contact information:   [place] [address] [place] [id number] [phone]        The results of significant diagnostics from this hospitalization (including imaging, microbiology, ancillary and laboratory) are listed below for reference.    Significant Diagnostic Studies: Dg Abd 1 View  08/02/2014   CLINICAL DATA:  Mid abdominal pain and tenderness. Abdominal distention this morning.  EXAM: ABDOMEN - 1 VIEW  COMPARISON:  PET-CT 06/05/2014  FINDINGS: New transverse and proximal colonic gaseous distension up to 9 cm. No dilated small bowel or stomach. Cholecystectomy and left pelvic surgical clips.  IMPRESSION: Gaseous distention of colon suggesting colonic ileus.   Electronically Signed   By: Monte Fantasia M.D.   On: 08/02/2014 09:15   US Abdomen Complete  07/29/2014   CLINICAL DATA:  Initial evaluation elevated liver function enzymes, personal history of cholecystectomy, breast cancer, ovarian cancer, currently on chemotherapy  EXAM: ULTRASOUND ABDOMEN COMPLETE  COMPARISON:  None.  FINDINGS: Gallbladder: Surgically absent  Common bile duct: Diameter: 5 mm  Liver: Heterogeneous echotexture. No focal abnormalities. Trace fluid identified around the liver.  IVC: No abnormality visualized.  Pancreas: Not identified  Spleen: Normal with a span of 10 cm  Right Kidney: Length: 12.8 cm. Echogenicity within normal limits. No mass or hydronephrosis visualized.  Left Kidney: Length: 11.1 cm. Echogenicity within normal limits. No mass or hydronephrosis visualized.  Abdominal aorta: Not well seen.  Other findings: None.  IMPRESSION: Hit coarsened heterogeneous hepatic echotexture suggesting a hepatic parenchymal  disease, used which can include steatosis. Left trace fluid seen around the liver.   Electronically Signed   By: Skipper Cliche M.D.   On: 07/29/2014 11:37   Ir Fluoro Guide Cv Line Left  07/11/2014   CLINICAL DATA:  PRIOR HISTORY BREAST CANCER, NEW DIAGNOSIS OF OVARIAN CANCER.  EXAM: LEFT INTERNAL JUGULAR SINGLE LUMEN POWER PORT CATHETER INSERTION  Date:  3/1/20163/05/2014 10:20 am  Radiologist:  M. Daryll Brod, MD  Guidance:  ULTRASOUND AND FLUOROSCOPIC  FLUOROSCOPY TIME:  1 MINUTES 12 SECONDS  MEDICATIONS AND MEDICAL HISTORY: 2 g Ancefadministered within 1 hour of the procedure.Versed and fentanyl for conscious sedation  ANESTHESIA/SEDATION: 40 minutes  CONTRAST:  None.  COMPLICATIONS: None immediate  PROCEDURE: Informed consent was obtained from the patient following explanation of the procedure, risks, benefits and alternatives. The patient understands, agrees and consents for the procedure. All questions were addressed. A time out was performed.  Maximal barrier sterile technique utilized including caps, mask, sterile gowns, sterile gloves, large sterile drape, hand hygiene, and 2% chlorhexidine scrub.  Under sterile conditions and local anesthesia, right internal jugular micropuncture venous access was performed. Access was performed with ultrasound. Images were obtained for documentation. A guide wire was inserted followed by a transitional dilator. This allowed insertion of a guide wire and catheter into the IVC. Measurements were obtained from the SVC / RA junction back to the right  IJ venotomy site. In the right infraclavicular chest, a subcutaneous pocket was created over the second anterior rib. This was done under sterile conditions and local anesthesia. 1% lidocaine with epinephrine was utilized for this. A 2.5 cm incision was made in the skin. Blunt dissection was performed to create a subcutaneous pocket over the right pectoralis major muscle. The pocket was flushed with saline vigorously. There  was adequate hemostasis. The port catheter was assembled and checked for leakage. The port catheter was secured in the pocket with two retention sutures. The tubing was tunneled subcutaneously to the right venotomy site and inserted into the SVC/RA junction through a valved peel-away sheath. Position was confirmed with fluoroscopy. Images were obtained for documentation. The patient tolerated the procedure well. No immediate complications. Incisions were closed in a two layer fashion with 4 - 0 Vicryl suture. Dermabond was applied to the skin. The port catheter was accessed, blood was aspirated followed by saline and heparin flushes. Needle was removed. A dry sterile dressing was applied.  IMPRESSION: Ultrasound and fluoroscopically guided right internal jugular single lumen power port catheter insertion. Tip in the SVC/RA junction. Catheter ready for use.   Electronically Signed   By: Jerilynn Mages.  Shick M.D.   On: 07/11/2014 10:31   Ir US Guide Vasc Access Left  07/11/2014   CLINICAL DATA:  PRIOR HISTORY BREAST CANCER, NEW DIAGNOSIS OF OVARIAN CANCER.  EXAM: LEFT INTERNAL JUGULAR SINGLE LUMEN POWER PORT CATHETER INSERTION  Date:  3/1/20163/05/2014 10:20 am  Radiologist:  M. Daryll Brod, MD  Guidance:  ULTRASOUND AND FLUOROSCOPIC  FLUOROSCOPY TIME:  1 MINUTES 12 SECONDS  MEDICATIONS AND MEDICAL HISTORY: 2 g Ancefadministered within 1 hour of the procedure.Versed and fentanyl for conscious sedation  ANESTHESIA/SEDATION: 40 minutes  CONTRAST:  None.  COMPLICATIONS: None immediate  PROCEDURE: Informed consent was obtained from the patient following explanation of the procedure, risks, benefits and alternatives. The patient understands, agrees and consents for the procedure. All questions were addressed. A time out was performed.  Maximal barrier sterile technique utilized including caps, mask, sterile gowns, sterile gloves, large sterile drape, hand hygiene, and 2% chlorhexidine scrub.  Under sterile conditions and local  anesthesia, right internal jugular micropuncture venous access was performed. Access was performed with ultrasound. Images were obtained for documentation. A guide wire was inserted followed by a transitional dilator. This allowed insertion of a guide wire and catheter into the IVC. Measurements were obtained from the SVC / RA junction back to the right IJ venotomy site. In the right infraclavicular chest, a subcutaneous pocket was created over the second anterior rib. This was done under sterile conditions and local anesthesia. 1% lidocaine with epinephrine was utilized for this. A 2.5 cm incision was made in the skin. Blunt dissection was performed to create a subcutaneous pocket over the right pectoralis major muscle. The pocket was flushed with saline vigorously. There was adequate hemostasis. The port catheter was assembled and checked for leakage. The port catheter was secured in the pocket with two retention sutures. The tubing was tunneled subcutaneously to the right venotomy site and inserted into the SVC/RA junction through a valved peel-away sheath. Position was confirmed with fluoroscopy. Images were obtained for documentation. The patient tolerated the procedure well. No immediate complications. Incisions were closed in a two layer fashion with 4 - 0 Vicryl suture. Dermabond was applied to the skin. The port catheter was accessed, blood was aspirated followed by saline and heparin flushes. Needle was removed. A dry sterile dressing was  applied.  IMPRESSION: Ultrasound and fluoroscopically guided right internal jugular single lumen power port catheter insertion. Tip in the SVC/RA junction. Catheter ready for use.   Electronically Signed   By: Jerilynn Mages.  Shick M.D.   On: 07/11/2014 10:31   Dg Chest Port 1 View  08/02/2014   CLINICAL DATA:  Respiratory failure, CHF  EXAM: PORTABLE CHEST - 1 VIEW  COMPARISON:  Portable chest x-ray of August 01, 2014  FINDINGS: The lungs are well-expanded. The interstitial  markings remain increased bilaterally. The retrocardiac region is slightly less dense. The cardiac silhouette remains enlarged. The pulmonary vascularity remains engorged. No significant pleural effusion is demonstrated. The right internal jugular venous catheter tip and the left-sided Port-A-Cath appliance tip project over the mid to distal SVC.  IMPRESSION: There has not been dramatic interval change since yesterday's study. There may be slight interval clearing of left lower lobe atelectasis.   Electronically Signed   By: David  Martinique   On: 08/02/2014 09:12   Dg Chest Port 1 View  08/01/2014   CLINICAL DATA:  Respiratory failure  EXAM: PORTABLE CHEST - 1 VIEW  COMPARISON:  07/31/2014  FINDINGS: Right jugular central line extends into the low SVC. Left jugular Port-A-Cath extends into the cavoatrial junction. There is moderate cardiomegaly, unchanged. There is moderate vascular and interstitial congestion which is worsened from the previous day. There now are mild ground-glass opacities in the central and basilar regions which may represent developing alveolar edema.  IMPRESSION: Congestive heart failure with mild worsening   Electronically Signed   By: Andreas Newport M.D.   On: 08/01/2014 07:01   Dg Chest Port 1 View  07/31/2014   CLINICAL DATA:  Sepsis  EXAM: PORTABLE CHEST - 1 VIEW  COMPARISON:  07/30/2014  FINDINGS: There is a right jugular central line extending into the low SVC. There is a left jugular Port-A-Cath with tip at the cavoatrial junction. There is unchanged cardiomegaly. There is moderate vascular and interstitial prominence. There is continued improvement with clearance of basilar opacities. No effusions are evident.  IMPRESSION: Improved, with residual vascular and interstitial prominence.   Electronically Signed   By: Andreas Newport M.D.   On: 07/31/2014 05:18   Dg Chest Port 1 View  07/30/2014   CLINICAL DATA:  Sepsis  EXAM: PORTABLE CHEST - 1 VIEW  COMPARISON:  07/29/2014   FINDINGS: There is a left-sided Port-A-Cath with tip in the cavoatrial junction. There is a right jugular central line with tip in the low SVC. There is moderate cardiomegaly which is unchanged. There is vascular and interstitial prominence, partially cleared since yesterday and there also is clearance of ground-glass opacities in the central lung regions. No large effusions.  IMPRESSION: Improved, with clearance of central ground-glass opacities and with reduction in the degree of vascular/interstitial congestive changes.   Electronically Signed   By: Andreas Newport M.D.   On: 07/30/2014 07:04   Dg Chest Port 1 View  07/29/2014   CLINICAL DATA:  Acute sepsis, heart failure, history of breast cancer  EXAM: PORTABLE CHEST - 1 VIEW  COMPARISON:  07/28/2014  FINDINGS: Left IJ port catheter tip SVC RA junction. New right IJ central line tip lower SVC level. Heart is enlarged with slight increased vascular and interstitial changes concerning for developing edema. No focal pneumonia, collapse or consolidation. No effusion or pneumothorax. Trachea midline. Postop changes from the right breast surgery and axillary lymph node dissection.  IMPRESSION: Right IJ central line tip lower SVC level.  Mild  developing edema pattern.   Electronically Signed   By: Jerilynn Mages.  Shick M.D.   On: 07/29/2014 09:54   Dg Chest Port 1 View  07/29/2014   CLINICAL DATA:  Fever and diarrhea for 2 days.  EXAM: PORTABLE CHEST - 1 VIEW  COMPARISON:  04/26/2014  FINDINGS: There is a left-sided Port-A-Cath with tip in the cavoatrial junction. There is generalized interstitial fluid or thickening which is increased from 04/26/2014. There is no airspace opacity. There is no large effusion.  IMPRESSION: Interstitial fluid or infiltrate, new from 04/26/2014.   Electronically Signed   By: Andreas Newport M.D.   On: 07/29/2014 00:32   Dg Chest Port 1v Same Day  08/06/2014   CLINICAL DATA:  Congestive heart failure. History of right-sided breast  carcinoma  EXAM: PORTABLE CHEST - 1 VIEW SAME DAY  COMPARISON:  August 02, 2014  FINDINGS: Cardiomegaly with mild pulmonary venous hypertension remains. There is interstitial edema, stable. Atelectasis in the left base with minimal left effusion remains. No new opacity. Port-A-Cath tip is at the cavoatrial junction without pneumothorax. Right jugular catheter is been removed. There are surgical clips in the right axillary region.  IMPRESSION: Evidence of a degree of congestive heart failure, stable. No new opacity. No pneumothorax.   Electronically Signed   By: Lowella Grip III M.D.   On: 08/06/2014 10:04    Microbiology: Recent Results (from the past 240 hour(s))  Clostridium Difficile by PCR     Status: None   Collection Time: 07/31/14  3:35 PM  Result Value Ref Range Status   C difficile by pcr NEGATIVE NEGATIVE Final     Labs: Basic Metabolic Panel:  Recent Labs Lab 08/02/14 0424 08/03/14 0450 08/04/14 0513 08/05/14 1340 08/06/14 0424 08/07/14 0405 08/08/14 0441  NA 135 137 136 134* 134* 134* 135  K 2.9* 3.7 3.6 3.8 3.5 3.5 3.4*  CL 102 106 106 104 104 103 105  CO2 24 24 23 23 24 23 24   GLUCOSE 147* 126* 93 142* 83 72 75  BUN 10 7 <5* 9 7 6  5*  CREATININE 0.49* 0.37* 0.37* 0.42* 0.36* 0.44* 0.37*  CALCIUM 6.6* 7.1* 6.9* 7.1* 7.0* 7.0* 7.1*  MG 1.7 1.6 1.5 1.5 2.1  --   --   PHOS 1.0* 1.5* 1.9*  --   --   --   --    Liver Function Tests:  Recent Labs Lab 08/06/14 0424 08/07/14 0405  AST 21 24  ALT 14 14  ALKPHOS 106 116  BILITOT 0.8 0.9  PROT 4.4* 4.4*  ALBUMIN 1.8* 1.7*   No results for input(s): LIPASE, AMYLASE in the last 168 hours. No results for input(s): AMMONIA in the last 168 hours. CBC:  Recent Labs Lab 08/04/14 0513 08/05/14 0540 08/06/14 0424 08/07/14 0405 08/08/14 0441  WBC 4.6 5.5 4.7 4.8 4.9  NEUTROABS 3.3 4.3 3.8 3.8 3.8  HGB 11.8* 12.1 11.4* 11.0* 10.8*  HCT 34.3* 36.3 34.0* 32.8* 32.5*  MCV 90.0 90.5 90.4 91.1 91.5  PLT 52* 84*  91* 90* 77*   Cardiac Enzymes:  Recent Labs Lab 08/01/14 1419 08/01/14 2055  TROPONINI 0.04* 0.04*   BNP: BNP (last 3 results) No results for input(s): BNP in the last 8760 hours.  ProBNP (last 3 results) No results for input(s): PROBNP in the last 8760 hours.  CBG:  Recent Labs Lab 08/07/14 1140 08/07/14 1712 08/07/14 2059 08/08/14 0730 08/08/14 1146  GLUCAP 77 81 74 70 82  Signed:  Vernell Leep, MD, FACP, FHM. [facility] Pager [phone]  If 7PM-7AM, please contact night-coverage [email or web address] Password [email or web address] 08/08/2014, 1:53 PM

## 2014-08-08 NOTE — Progress Notes (Addendum)
Physical Therapy Treatment Patient Details Name: [name] MRN: [id number] DOB: 1956/03/13 Today's Date: 08/08/2014    SATURATION QUALIFICATIONS: (This note is used to comply with regulatory documentation for home oxygen)  Patient Saturations on Room Air at Rest = 97%  Patient Saturations on Room Air while Ambulating = 93%   History of Present Illness 59 yo female admitted with febrile neutropenia, sepsis. Hx of ovarian cancer, breast cancer, DM, chronic Afib, CHF, HTN, DVT, ex lap/hysterectomy 06/2014, mastectomy.     PT Comments    Progressing with mobility. Pt anxious/excited about d/c home later today. Continues to require Min-Mod assist for mobility. O2 saturation reading difficult to obtain, due to poor signal, when moving, however sats always >90% when waveform normalizes once pt is stationary. HR continues to fluctuate: 110s-130s when ambulating on today. Husband states he will assist pt into home. No further questions/concerns from pt/husband.   Follow Up Recommendations  Home health PT;Supervision/Assistance - 24 hour     Equipment Recommendations  None recommended by PT    Recommendations for Other Services OT consult     Precautions / Restrictions Precautions Precautions: Fall Precaution Comments: monitor HR Restrictions Weight Bearing Restrictions: No    Mobility  Bed Mobility Overal bed mobility: Needs Assistance Bed Mobility: Supine to Sit     Supine to sit: Mod assist Sit to supine: Min guard   General bed mobility comments: Assist for trunk and bil LEs. Increased time. Multimodal cues for safety, technique, hand placement and for pt to do as much of the task as she can unassisted.  Transfers Overall transfer level: Needs assistance Equipment used: Rolling walker (2 wheeled) Transfers: Sit to/from Stand Sit to Stand: Min assist Stand pivot transfers: Min assist       General transfer comment: Assist to rise, stabilize, control descent,  maneuver with RW. Multimodal cues for safety, technique, hand placement. Sit to stand x 2 (once from bed, once from bsc)  Ambulation/Gait Ambulation/Gait assistance: Min assist Ambulation Distance (Feet): 60 Feet (x2) Assistive device: Rolling walker (2 wheeled) Gait Pattern/deviations: Trunk flexed;Decreased stride length;Step-through pattern     General Gait Details: Assist to stabilize throughout distance. LE instability noted while ambulating. Fatigues fairly easily. Followed closely with recliner. 1 seated rest break needed between walks due to fatigue. O2 sats reading difficult to obtain due to poor signal when moving, however sats always >90% when waveform normalizes.    Stairs            Wheelchair Mobility    Modified Rankin (Stroke Patients Only)       Balance           Standing balance support: Bilateral upper extremity supported;During functional activity Standing balance-Leahy Scale: Poor                      Cognition Arousal/Alertness: Awake/alert Behavior During Therapy: WFL for tasks assessed/performed Overall Cognitive Status: Within Functional Limits for tasks assessed                      Exercises      General Comments        Pertinent Vitals/Pain Pain Assessment: No/denies pain    Home Living                      Prior Function            PT Goals (current goals can now be found in the care  plan section) Progress towards PT goals: Progressing toward goals    Frequency  Min 3X/week    PT Plan Current plan remains appropriate    Co-evaluation             End of Session Equipment Utilized During Treatment: Gait belt Activity Tolerance: Patient tolerated treatment well Patient left: in bed;with call bell/phone within reach;with family/visitor present     Time: 9179-1505 PT Time Calculation (min) (ACUTE ONLY): 23 min  Charges:  $Gait Training: 8-22 mins $Therapeutic Activity: 8-22 mins                     G Codes:      [name], MPT Pager: [phone]

## 2014-08-08 NOTE — Progress Notes (Addendum)
[place] for warfarin Indication: afib, hx DVT  Allergies  Allergen Reactions  . Olmesartan Medoxomil Cough    REACTION: ? if cough  . Tetracycline Hcl     Unknown reaction, too long for patient to remember   . Venlafaxine     REACTION: severe dry moouth  . Adhesive [Tape] Rash   Patient Measurements: Height: 5\' 5"  (165.1 cm) Weight: 193 lb 6.4 oz (87.726 kg) IBW/kg (Calculated) : 57  Vital Signs: Temp: 97.7 F (36.5 C) (03/29 0602) Temp Source: Oral (03/29 0602) BP: 110/67 mmHg (03/29 0938) Pulse Rate: 108 (03/29 1008)  Labs:  Recent Labs  08/06/14 0424 08/07/14 0405 08/08/14 0441  HGB 11.4* 11.0* 10.8*  HCT 34.0* 32.8* 32.5*  PLT 91* 90* 77*  LABPROT 21.3* 22.9* 30.9*  INR 1.83* 2.00* 2.94*  CREATININE 0.36* 0.44* 0.37*   Estimated Creatinine Clearance: 81.8 mL/min (by C-G formula based on Cr of 0.37).  Medical History: Past Medical History  Diagnosis Date  . Depression   . DVT (deep venous thrombosis)     hx of on HRT left leg ~2006  . GERD (gastroesophageal reflux disease)   . Hyperlipidemia   . Hypertension   . Hypothyroidism   . PPD positive, treated     rx inh   . Diabetes mellitus   . 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 DJD (degenerative joint disease) of lumbar spine   . Chronic diastolic CHF (congestive heart failure)     a. 08/2012 Echo: EF 55-60%, no rwma, mod MR.  [name] A-fib     a. on amio for rate control/coumadin.  [name] Physical deconditioning   . Allergy   . Anemia     low iron hx  . Blood transfusion without reported diagnosis     2 u prbc  . Cataract     removed ou  . OSA on CPAP     cpap 4.5 setting  . Breast cancer 2014    a. 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 antiseptic oral rinse  7 mL Mouth Rinse q12n4p  . cefTRIAXone  (ROCEPHIN)  IV  2 g Intravenous Q24H  . chlorhexidine  15 mL Mouth Rinse BID  . digoxin  0.25 mg Oral Daily  . famotidine  20 mg Oral Daily  . feeding supplement (ENSURE ENLIVE)  237 mL Oral BID BM  . furosemide  20 mg Oral Daily  . letrozole  2.5 mg Oral Daily  . levothyroxine  150 mcg Oral QAC breakfast  . metoprolol tartrate  50 mg Oral BID  . saccharomyces boulardii  250 mg Oral BID  . sodium chloride  10-40 mL Intracatheter Q12H  . Warfarin - Pharmacist Dosing Inpatient   Does not apply q1800   Assessment: 59 year old female patient with history of ovarian and breast cancer on chemotherapy, DM2, HTN, cirrhosis, chronic A. fib on Coumadin, CHF presented with nausea, vomiting, diarrhea, fever, neutropenia and A. fib with RVR. Home warfarin dose 6mg  daily except 3mg  on MWF.  INR elevated on admit 3/18 (6.02),Warfarin held, 2mg  Vitamin K on 3/19,and 5mg  on 3/20 -> INR 2.44  Warfarin resumed 3/27   Inpatient warfarin doses: 7mg  3/27, 6mg  3/28  Today's INR 2.94, significant increase over past 24 hours,   Ptlc decreased to 77, H/H low but stable  No bleeding issues noted  Regular diet from 3/26, but still with minimal PO intake (documented as 0% for meals yesterday)  Goal of Therapy:  INR 2-3  Plan:   Give much smaller dose of Warfarin 1 mg today (prior to discharge) given large increase in INR over past 24 hours.   Daily PT/INR.  Monitor for signs/symptoms of bleeding.  Recommend Warfarin 3 mg daily at discharge with close INR follow-up in 2-3 days post-discharge given patient's poor nutritional status. If PO intake improves, can consider increasing back to previous home regimen as above.  Lindell Spar, PharmD, BCPS Pager: [phone] 08/08/2014 11:03 AM

## 2014-08-08 NOTE — Telephone Encounter (Signed)
per Ron @ [facility] to sch pt lab per order from MD(no name given)adv pt had labs on 4/4-stated needed sch 4/1-gave time & date-sated he will notify pt

## 2014-08-09 ENCOUNTER — Other Ambulatory Visit: Payer: Self-pay | Admitting: Oncology

## 2014-08-11 ENCOUNTER — Other Ambulatory Visit (HOSPITAL_BASED_OUTPATIENT_CLINIC_OR_DEPARTMENT_OTHER): Payer: BLUE CROSS/BLUE SHIELD

## 2014-08-11 ENCOUNTER — Other Ambulatory Visit (HOSPITAL_COMMUNITY)
Admission: AD | Admit: 2014-08-11 | Discharge: 2014-08-11 | Disposition: A | Discharge status: Home / Self Care | Payer: BLUE CROSS/BLUE SHIELD | Source: Ambulatory Visit | Attending: Oncology | Admitting: Oncology

## 2014-08-11 ENCOUNTER — Ambulatory Visit (INDEPENDENT_AMBULATORY_CARE_PROVIDER_SITE_OTHER): Payer: BLUE CROSS/BLUE SHIELD | Admitting: Pharmacist

## 2014-08-11 ENCOUNTER — Other Ambulatory Visit: Payer: Self-pay | Admitting: Family

## 2014-08-11 ENCOUNTER — Encounter: Payer: Self-pay | Admitting: *Deleted

## 2014-08-11 ENCOUNTER — Other Ambulatory Visit: Payer: Self-pay

## 2014-08-11 ENCOUNTER — Telehealth: Payer: Self-pay | Admitting: Family

## 2014-08-11 DIAGNOSIS — C569 Malignant neoplasm of unspecified ovary: Secondary | ICD-10-CM | POA: Diagnosis present

## 2014-08-11 DIAGNOSIS — Z7901 Long term (current) use of anticoagulants: Secondary | ICD-10-CM | POA: Diagnosis not present

## 2014-08-11 DIAGNOSIS — I4891 Unspecified atrial fibrillation: Secondary | ICD-10-CM | POA: Diagnosis not present

## 2014-08-11 DIAGNOSIS — C50911 Malignant neoplasm of unspecified site of right female breast: Secondary | ICD-10-CM

## 2014-08-11 DIAGNOSIS — Z86718 Personal history of other venous thrombosis and embolism: Secondary | ICD-10-CM | POA: Diagnosis not present

## 2014-08-11 LAB — CBC WITH DIFFERENTIAL/PLATELET
BASO%: 0.8 % (ref 0.0–2.0)
BASOS ABS: 0.1 10*3/uL (ref 0.0–0.1)
EOS ABS: 0.1 10*3/uL (ref 0.0–0.5)
EOS%: 0.8 % (ref 0.0–7.0)
HCT: 37.9 % (ref 34.8–46.6)
HEMOGLOBIN: 12.3 g/dL (ref 11.6–15.9)
LYMPH%: 15.7 % (ref 14.0–49.7)
MCH: 29.9 pg (ref 25.1–34.0)
MCHC: 32.4 g/dL (ref 31.5–36.0)
MCV: 92.2 fL (ref 79.5–101.0)
MONO#: 0.8 10*3/uL (ref 0.1–0.9)
MONO%: 10.3 % (ref 0.0–14.0)
NEUT#: 5.5 10*3/uL (ref 1.5–6.5)
NEUT%: 72.4 % (ref 38.4–76.8)
PLATELETS: 114 10*3/uL — AB (ref 145–400)
RBC: 4.11 10*6/uL (ref 3.70–5.45)
RDW: 15.5 % — ABNORMAL HIGH (ref 11.2–14.5)
WBC: 7.6 10*3/uL (ref 3.9–10.3)
lymph#: 1.2 10*3/uL (ref 0.9–3.3)

## 2014-08-11 LAB — COMPREHENSIVE METABOLIC PANEL (CC13)
ALBUMIN: 2.1 g/dL — AB (ref 3.5–5.0)
ALT: 25 U/L (ref 0–55)
ANION GAP: 10 meq/L (ref 3–11)
AST: 52 U/L — AB (ref 5–34)
Alkaline Phosphatase: 158 U/L — ABNORMAL HIGH (ref 40–150)
BUN: 5.8 mg/dL — ABNORMAL LOW (ref 7.0–26.0)
CHLORIDE: 105 meq/L (ref 98–109)
CO2: 23 mEq/L (ref 22–29)
CREATININE: 0.6 mg/dL (ref 0.6–1.1)
Calcium: 7.9 mg/dL — ABNORMAL LOW (ref 8.4–10.4)
EGFR: >90 mL/min/{1.73_m2} (ref >90)
Glucose: 73 mg/dl (ref 70–140)
Potassium: 4 mEq/L (ref 3.5–5.1)
Sodium: 138 mEq/L (ref 136–145)
Total Bilirubin: 1.19 mg/dL (ref 0.20–1.20)
Total Protein: 5.5 g/dL — ABNORMAL LOW (ref 6.4–8.3)

## 2014-08-11 LAB — PROTIME-INR
INR: 5.82 — AB (ref 0.00–1.49)
Prothrombin Time: 52.6 seconds — ABNORMAL HIGH (ref 11.6–15.2)

## 2014-08-11 LAB — POCT INR: INR: 5.62

## 2014-08-11 NOTE — Patient Instructions (Signed)
Hold Coumadin until we see you again in clinic on Monday, 08/14/14.  We will see you on 08/14/14 either during infusion or after your appmt with Dr. Jana Hakim.

## 2014-08-11 NOTE — Telephone Encounter (Signed)
Patient was not available (was asleep) during call.  Patient's husband stated she will call back when she is available.

## 2014-08-11 NOTE — Telephone Encounter (Addendum)
Transition Care Management Follow-up Telephone Call  How have you been since you were released from the hospital? "I'm doing good, if I could get my appetite back I would be doing great."   Do you understand why you were in the hospital? NO-my husband takes care of all of that, Husband: YES- husband states "her HR went crazy and she had diarrhea"     Do you understand the discharge instrcutions? YES  Items Reviewed:  Medications reviewed: NO- unable to review all medications because patient was not in place where he could check list and did not remember names.   Allergies reviewed: YES  Dietary changes reviewed: YES  Referrals reviewed:  YES   Functional Questionnaire:   Activities of Daily Living (ADLs):   She states they are independent in the following: walking with a walker (new since hospitalization) States they require assistance with the following: restroom, dressing, husband and daughter are helping and will have [facility]   Any transportation issues/concerns?:  NO- husband will drive   Any patient concerns? YES- swelling in her feet, started in the hospital- "they have her on a fluid pill, but feet are still swollen"   Confirmed importance and date/time of follow-up visits scheduled: YES- appointment with Debbrah Alar 08/18/14.     Confirmed with patient if condition begins to worsen call PCP or go to the ER.  Patient was given the Call-a-Nurse line [phone]: YES

## 2014-08-11 NOTE — Addendum Note (Signed)
Addended by: Leticia Penna A on: 08/11/2014 04:54 PM   Modules accepted: Medications

## 2014-08-11 NOTE — Progress Notes (Signed)
Pt showed up for her lab appmt and no CC appmt INR was sent out and returned at 5.62 No s/s of bleeding other than a small amount in stool today Pt was recently hospitalized after her first infusion Was dc'd on Bactrim DS BID for 3 days, last dose should be 4.1.16 (husband was unaware how many tablets she had remaining) Instructed patient and husband to NOT take any coumadin this weekend  She will finish off her Bactrim They requested a calendar of appmts as they were confused about appmt today I provided one Val and patient aware that we need to see her on Mon whether she is treated or not Patient was thankful we saw her today and appreciates our time

## 2014-08-11 NOTE — Progress Notes (Unsigned)
Called Val with critical INR 5.82 performed at [facility]. sdd

## 2014-08-11 NOTE — Telephone Encounter (Signed)
Please contact pt for hospital follow up Transitional Care.

## 2014-08-12 LAB — CA 125: CA 125: 544 U/mL — AB (ref <35)

## 2014-08-12 NOTE — Progress Notes (Signed)
ID: [name]   DOB: 01-07-1956  MR#: [id number]  URK#:[id number]  PCP: [name], NP GYN:  SU: [facility] OTHER GB:TDVVOH [name], Shanon Ace, Roney Jaffe  CHIEF COMPLAINT:  Estrogen receptor positive Right Breast Cancer  CURRENT TREATMENT: Letrozole; carboplatin/ paclitaxel   BREAST CANCER HISTORY: From the original consult note:  [name] noted a mass in her right breast mid December 2013, and as it did not spontaneously resolve over a couple of weeks she brought it to her primary physician's attention. She was set up for diagnostic mammography and right breast ultrasonography at the breast Center 05/10/2012. (Note that the patient's most recent prior mammography had been in October 2008). The current study showed a spiculated mass in the superior subareolar portion of the right breast measuring approximately 5 cm and associated with pleomorphic calcifications. This was firm and palpable. There was right nipple retraction and skin thickening. Ultrasound confirmed an irregularly marginated hypoechoic mass measuring 3.5 cm by ultrasound, and an abnormal appearing lower right axillary lymph node measuring 2.6 cm.  Biopsies of both the breast mass and the abnormal appearing lymph node were performed 05/21/2012. Both showed an invasive ductal carcinoma, grade 2, with similar prognostic panels (the breast mass was 100% estrogen and 73% progesterone receptor positive, with an MIB-1 of 5%; the lymph node was 100% estrogen 100% progesterone receptor positive, with an MIB-1 of 20%). Both masses were HER-2 negative.  Breast MRI obtained at [facility] imaging 05/29/2012 confirmed a dominant mass in the retroareolar right breast measuring 4.4 cm maximally. There was a satellite nodule inferior and lateral to this mass, measuring 1.7 cm. There were no other masses in either breast. Aside from the previously biopsied lymph node there were other mildly enhancing level  I right axillary lymph nodes which did not appear pathologic. There was no other lymphadenopathy noted.   The patient's subsequent history is as detailed below.  OVARIAN CANCER HISTORY: From the 06/16/2014 summary note:  "[name] returns today for review of her restaging studies accompanied by her husband [name]. To summarize her recent history: She was admitted to [facility] with a diagnosis of possible pneumonia in late December 2015. Chest x-ray showed some increased density in the right upper lobe. CT scan was suggested and was obtained by Dr. Inda Castle. This showed 2 small nodules in the right middle lobe, measuring 8 and 4 mm respectively. Dr. Inda Castle then contacted [name] for further evaluation and we set [place] up for a PET scan and which was performed late January. This showed the 2 nodules in the lung not to be hypermetabolic. This of course does not prove that they are not malignant. More importantly, there was a 13.7 cm cystic/solid mass in the upper pelvis associated with adenopathy,. All of this was hypermetabolic."  The patient was evaluated by gynecologic oncology and on 06/27/2014 she underwent surgery under Dr. Denman George. This consisted of an exploratory laparotomy with hysterectomy and abdominal salpingo-oophorectomy, as well as tumor debulking and omentectomy. 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 The tumor measured 11.5 cm, focally involve the ovarian capsule on the left; the uterus and right ovary and fallopian tube were unremarkable. One para-aortic lymph node was positive out of a total of 6 lymph nodes sampled (2 left common iliac, 2 left para-aortic, and to within the omental resection).  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 Today is day 30 cycle 2 of  8 planned cycles of carboplatin and paclitaxel given days 1 and 8 of each 21 day cycle. However the day 8 treatment of cycle 2 was omitted because the patient developed Klebsiella sepsis, requiring admission to the ICU. She had a stormy course and was actually close to dying, but recovered and is here today to discuss further treatment.  REVIEW OF SYSTEMS: [name] is still very weak from her treatment. She uses a wheelchair at home. She has a walker but is very unsteady on it. She tells me she is about to start physical therapy. She sleeps most of the day. Her appetite is gone. She has no sense of taste. She continues to have diarrheal bowel movements. She uses Imodium for this. Her ankles are swelling. She has a great deal of difficulty concentrating and remembering pharynx. [name] has to repeat things many times over. She is doing no housework or cooking. Their daughter is doing all that for that. She continues to have problems with her gums, ankle swelling, inability to walk right now because her legs are so weak, heartburn, anxiety and depression. She tells me her diabetes is "okay". A detailed review of systems today was otherwise stable  PAST MEDICAL HISTORY: Past Medical History  Diagnosis Date  . Depression   . DVT (deep venous thrombosis)     hx of on HRT left leg ~2006  . GERD (gastroesophageal reflux disease)   . Hyperlipidemia   . Hypertension   . Hypothyroidism   . PPD positive, treated     rx inh   . Diabetes mellitus   . 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 DJD (degenerative joint disease) of lumbar spine   . Chronic diastolic CHF (congestive heart failure)     a. 08/2012 Echo: EF 55-60%, no rwma, mod MR.  [name] A-fib     a. on amio for rate control/coumadin.  [name] Physical deconditioning   . Allergy   . Anemia     low iron hx  . Blood transfusion without reported diagnosis     2 u prbc  . Cataract     removed ou  . OSA on CPAP     cpap 4.5 setting  . Breast  cancer 2014    a. 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 Tubal ligation    . Cholecystectomy    . Foot surgery    . Eye surgery    . Cataract extraction    . Percutaneous liver biopsy    . Breast biopsy      left breast  . Mastectomy modified radical  06/08/2012    Procedure: MASTECTOMY MODIFIED RADICAL;  Surgeon: Edward Jolly, MD;  Location: [place];  Service: General;  Laterality: Right;  . Portacath placement  06/08/2012    Procedure: INSERTION PORT-A-CATH;  Surgeon: Edward Jolly, MD;  Location: [place];  Service: General;  Laterality: Left;  . Peg tube placement      peg removed 2015  . History of chemotherapy x 2 treatments, radiation tx  2014  . Pac removed  2015  . Laparotomy N/A 06/27/2014    Procedure: EXPLORATORY LAPAROTOMY;  Surgeon: Everitt Amber,  MD;  Location: [facility];  Service: Gynecology;  Laterality: N/A;  . Abdominal hysterectomy N/A 06/27/2014    Procedure: HYSTERECTOMY ABDOMINAL TOTAL;  Surgeon: Everitt Amber, MD;  Location: [facility];  Service: Gynecology;  Laterality: N/A;  . Salpingoophorectomy Bilateral 06/27/2014    Procedure: BILATERAL SALPINGO OOPHORECTOMY/TUMOR DEBULKING/LYMPHNODE DISSECTION, OMENTECTOMY;  Surgeon: Everitt Amber, MD;  Location: [facility];  Service: Gynecology;  Laterality: Bilateral;    FAMILY HISTORY Family History  Problem Relation Age of Onset  . Diabetes Mother   . Hypertension Mother   . Arthritis Mother   . Heart disease Mother   . Heart failure Mother   . Other Mother     benign breast mass  . Stroke Father   . Heart disease Father   . Diabetes Paternal Grandmother   . Colon cancer Paternal Grandfather    the patient's father died in his 37s with a history of dementia. He had had prior strokes. The patient's mother died in her 75s, with a history  of congestive heart failure. Jannat had no brothers, one sister. There is no history of breast or ovarian cancer in the family.  GYNECOLOGIC HISTORY: Menarche age 22, first live birth age 84, she is GX P2, menopause approximately 15 years ago, on hormone replacement until 2010.  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 Her husband [name] works for [employer]. Daughter Paul Half is a Physiological scientist and lives in [place]. Daughter Santiago Bur and her family (husband and 2 children aged 89 and 3 years) currently live with the patient. Hiral is a member of a [employer].   ADVANCED DIRECTIVES: Not in place  HEALTH MAINTENANCE: (Updated October 2014) History  Substance Use Topics  . Smoking status: Former Smoker -- 1.00 packs/day for 1 years  . Smokeless tobacco: Never Used  . Alcohol Use: No     Colonoscopy: Never  PAP: Does not recall  Bone density: Never  Lipid panel:   Allergies  Allergen Reactions  . Olmesartan Medoxomil Cough    REACTION: ? if cough  . Tetracycline Hcl     Unknown reaction, too long for patient to remember   . Venlafaxine     REACTION: severe dry moouth  . Adhesive [Tape] Rash    Current Outpatient Prescriptions  Medication Sig Dispense Refill  . ALPRAZolam (XANAX XR) 1 MG 24 hr tablet Take 1 tablet (1 mg total) by mouth 2 (two) times daily. 60 tablet 1  . digoxin (LANOXIN) 0.25 MG tablet Take 1 tablet (0.25 mg total) by mouth daily. 30 tablet 0  . famotidine (PEPCID) 20 MG tablet Take 1 tablet (20 mg total) by mouth daily. 30 tablet 0  . feeding supplement, ENSURE ENLIVE, (ENSURE ENLIVE) LIQD Take 237 mLs by mouth 2 (two) times daily between meals.    . furosemide (LASIX) 20 MG tablet Take 1 tablet (20 mg total) by mouth daily. 90 tablet 1  . letrozole (FEMARA) 2.5 MG tablet Take 1 tablet (2.5 mg total) by mouth daily. 90  tablet 0  . levothyroxine (SYNTHROID, LEVOTHROID) 150 MCG tablet Take 1 tablet (150 mcg total) by mouth daily. 90 tablet 1  . lidocaine-prilocaine (EMLA) cream Apply over port area 1-2 hours before chemotherapy 30 g 0  . loperamide (IMODIUM A-D) 2 MG tablet Take 1 tablet (2 mg total) by mouth 3 (three) times daily as needed for diarrhea or loose stools.  15 tablet 0  . metoprolol (LOPRESSOR) 50 MG tablet Take 1 tablet (50 mg total) by mouth 2 (two) times daily. 60 tablet 0  . ondansetron (ZOFRAN) 8 MG tablet Take 1 tablet (8 mg total) by mouth every 8 (eight) hours as needed for nausea or vomiting.    . potassium chloride (K-DUR) 10 MEQ tablet Take 10 mEq by mouth 2 (two) times daily.  3  . saccharomyces boulardii (FLORASTOR) 250 MG capsule Take 1 capsule (250 mg total) by mouth 2 (two) times daily. 30 capsule 0  . sulfamethoxazole-trimethoprim (BACTRIM DS,SEPTRA DS) 800-160 MG per tablet Take 1 tablet by mouth 2 (two) times daily. 6 tablet 0  . warfarin (COUMADIN) 3 MG tablet Take 1 tablet (3 mg total) by mouth daily at 6 PM. Or as directed by coumadin clinic.     No current facility-administered medications for this visit.   Facility-Administered Medications Ordered in Other Visits  Medication Dose Route Frequency Provider Last Rate Last Dose  . sodium chloride 0.9 % injection 10 mL  10 mL Intracatheter PRN Chauncey Cruel, MD   10 mL at 07/24/14 1523    OBJECTIVE: Middle-aged white woman examined in a wheelchair Filed Vitals:   08/14/14 1106  BP: 100/61  Pulse: 70  Temp: 97.9 F (36.6 C)  Resp: 18     Body mass index is 30.87 kg/(m^2).    ECOG FS: 2 Filed Weights   08/14/14 1106  Weight: 185 lb 8 oz (84.142 kg)   Sclerae unicteric, EOMs intact Oropharynx clear and moist No cervical or supraclavicular adenopathy Lungs no rales or rhonchi, poor excursion bilaterally Heart regular rate and rhythm Abd soft, obese, positive bowel sounds; no distention or fluid wave noted MSK no  focal spinal tenderness, bilateral lower extremity lymphedema  Neuro: nonfocal Breasts: Deferred   LAB RESULTS: Lab Results  Component Value Date   WBC 5.6 08/14/2014   NEUTROABS 3.7 08/14/2014   HGB 12.5 08/14/2014   HCT 38.4 08/14/2014   MCV 93.9 08/14/2014   PLT 92* 08/14/2014      Chemistry      Component Value Date/Time   NA 136 08/14/2014 1049   NA 135 08/08/2014 0441   K 4.6 08/14/2014 1049   K 3.4* 08/08/2014 0441   CL 105 08/08/2014 0441   CL 104 10/29/2012 1058   CO2 25 08/14/2014 1049   CO2 24 08/08/2014 0441   BUN 3.7* 08/14/2014 1049   BUN 5* 08/08/2014 0441   CREATININE 0.6 08/14/2014 1049   CREATININE 0.37* 08/08/2014 0441   CREATININE 0.69 11/10/2013 0828      Component Value Date/Time   CALCIUM 7.7* 08/14/2014 1049   CALCIUM 7.1* 08/08/2014 0441   ALKPHOS 162* 08/14/2014 1049   ALKPHOS 116 08/07/2014 0405   AST 38* 08/14/2014 1049   AST 24 08/07/2014 0405   ALT 17 08/14/2014 1049   ALT 14 08/07/2014 0405   BILITOT 1.21* 08/14/2014 1049   BILITOT 0.9 08/07/2014 0405      STUDIES: Dg Abd 1 View  08/02/2014   CLINICAL DATA:  Mid abdominal pain and tenderness. Abdominal distention this morning.  EXAM: ABDOMEN - 1 VIEW  COMPARISON:  PET-CT 06/05/2014  FINDINGS: New transverse and proximal colonic gaseous distension up to 9 cm. No dilated small bowel or stomach. Cholecystectomy and left pelvic surgical clips.  IMPRESSION: Gaseous distention of colon suggesting colonic ileus.   Electronically Signed   By: Monte Fantasia M.D.   On: 08/02/2014 09:15  US Abdomen Complete  07/29/2014   CLINICAL DATA:  Initial evaluation elevated liver function enzymes, personal history of cholecystectomy, breast cancer, ovarian cancer, currently on chemotherapy  EXAM: ULTRASOUND ABDOMEN COMPLETE  COMPARISON:  None.  FINDINGS: Gallbladder: Surgically absent  Common bile duct: Diameter: 5 mm  Liver: Heterogeneous echotexture. No focal abnormalities. Trace fluid identified  around the liver.  IVC: No abnormality visualized.  Pancreas: Not identified  Spleen: Normal with a span of 10 cm  Right Kidney: Length: 12.8 cm. Echogenicity within normal limits. No mass or hydronephrosis visualized.  Left Kidney: Length: 11.1 cm. Echogenicity within normal limits. No mass or hydronephrosis visualized.  Abdominal aorta: Not well seen.  Other findings: None.  IMPRESSION: Hit coarsened heterogeneous hepatic echotexture suggesting a hepatic parenchymal disease, used which can include steatosis. Left trace fluid seen around the liver.   Electronically Signed   By: Skipper Cliche M.D.   On: 07/29/2014 11:37   Dg Chest Port 1 View  08/02/2014   CLINICAL DATA:  Respiratory failure, CHF  EXAM: PORTABLE CHEST - 1 VIEW  COMPARISON:  Portable chest x-ray of August 01, 2014  FINDINGS: The lungs are well-expanded. The interstitial markings remain increased bilaterally. The retrocardiac region is slightly less dense. The cardiac silhouette remains enlarged. The pulmonary vascularity remains engorged. No significant pleural effusion is demonstrated. The right internal jugular venous catheter tip and the left-sided Port-A-Cath appliance tip project over the mid to distal SVC.  IMPRESSION: There has not been dramatic interval change since yesterday's study. There may be slight interval clearing of left lower lobe atelectasis.   Electronically Signed   By: David  Martinique   On: 08/02/2014 09:12   Dg Chest Port 1 View  08/01/2014   CLINICAL DATA:  Respiratory failure  EXAM: PORTABLE CHEST - 1 VIEW  COMPARISON:  07/31/2014  FINDINGS: Right jugular central line extends into the low SVC. Left jugular Port-A-Cath extends into the cavoatrial junction. There is moderate cardiomegaly, unchanged. There is moderate vascular and interstitial congestion which is worsened from the previous day. There now are mild ground-glass opacities in the central and basilar regions which may represent developing alveolar edema.   IMPRESSION: Congestive heart failure with mild worsening   Electronically Signed   By: Andreas Newport M.D.   On: 08/01/2014 07:01   Dg Chest Port 1 View  07/31/2014   CLINICAL DATA:  Sepsis  EXAM: PORTABLE CHEST - 1 VIEW  COMPARISON:  07/30/2014  FINDINGS: There is a right jugular central line extending into the low SVC. There is a left jugular Port-A-Cath with tip at the cavoatrial junction. There is unchanged cardiomegaly. There is moderate vascular and interstitial prominence. There is continued improvement with clearance of basilar opacities. No effusions are evident.  IMPRESSION: Improved, with residual vascular and interstitial prominence.   Electronically Signed   By: Andreas Newport M.D.   On: 07/31/2014 05:18   Dg Chest Port 1 View  07/30/2014   CLINICAL DATA:  Sepsis  EXAM: PORTABLE CHEST - 1 VIEW  COMPARISON:  07/29/2014  FINDINGS: There is a left-sided Port-A-Cath with tip in the cavoatrial junction. There is a right jugular central line with tip in the low SVC. There is moderate cardiomegaly which is unchanged. There is vascular and interstitial prominence, partially cleared since yesterday and there also is clearance of ground-glass opacities in the central lung regions. No large effusions.  IMPRESSION: Improved, with clearance of central ground-glass opacities and with reduction in the degree of vascular/interstitial congestive changes.  Electronically Signed   By: Andreas Newport M.D.   On: 07/30/2014 07:04   Dg Chest Port 1 View  07/29/2014   CLINICAL DATA:  Acute sepsis, heart failure, history of breast cancer  EXAM: PORTABLE CHEST - 1 VIEW  COMPARISON:  07/28/2014  FINDINGS: Left IJ port catheter tip SVC RA junction. New right IJ central line tip lower SVC level. Heart is enlarged with slight increased vascular and interstitial changes concerning for developing edema. No focal pneumonia, collapse or consolidation. No effusion or pneumothorax. Trachea midline. Postop changes from  the right breast surgery and axillary lymph node dissection.  IMPRESSION: Right IJ central line tip lower SVC level.  Mild developing edema pattern.   Electronically Signed   By: Jerilynn Mages.  Shick M.D.   On: 07/29/2014 09:54   Dg Chest Port 1 View  07/29/2014   CLINICAL DATA:  Fever and diarrhea for 2 days.  EXAM: PORTABLE CHEST - 1 VIEW  COMPARISON:  04/26/2014  FINDINGS: There is a left-sided Port-A-Cath with tip in the cavoatrial junction. There is generalized interstitial fluid or thickening which is increased from 04/26/2014. There is no airspace opacity. There is no large effusion.  IMPRESSION: Interstitial fluid or infiltrate, new from 04/26/2014.   Electronically Signed   By: Andreas Newport M.D.   On: 07/29/2014 00:32   Dg Chest Port 1v Same Day  08/06/2014   CLINICAL DATA:  Congestive heart failure. History of right-sided breast carcinoma  EXAM: PORTABLE CHEST - 1 VIEW SAME DAY  COMPARISON:  August 02, 2014  FINDINGS: Cardiomegaly with mild pulmonary venous hypertension remains. There is interstitial edema, stable. Atelectasis in the left base with minimal left effusion remains. No new opacity. Port-A-Cath tip is at the cavoatrial junction without pneumothorax. Right jugular catheter is been removed. There are surgical clips in the right axillary region.  IMPRESSION: Evidence of a degree of congestive heart failure, stable. No new opacity. No pneumothorax.   Electronically Signed   By: Lowella Grip III M.D.   On: 08/06/2014 10:04    CT CHEST WITHOUT CONTRAST  TECHNIQUE: Multidetector CT imaging of the chest was performed following the standard protocol without IV contrast.  COMPARISON: Chest x-ray of 04/26/2014  FINDINGS: On lung window images, there is radiation fibrosis within the anterior right upper mid upper lung field which may account for the vague opacity questioned recently on chest x-ray. Also there is right apical pleural parenchymal scarring present. No infiltrate  or effusion is seen. Changes of right mastectomy are noted and surgical clips are present in the right axilla from prior right axillary nodal dissection. However, on lung window images, there are 2 new noncalcified nodules present, both in the right middle lobe, one of 8 mm and a second of 4 mm in diameter. These are not seen on the prior CT chest of 11/07/2008, and therefore these nodules are worrisome for metastatic involvement. No left lung nodules are seen. Somewhat prominent interstitial markings are present primarily at the lung bases posteriorly. These may be chronic in nature but mild fluid overload cannot be excluded. The central airway is patent.  On soft tissue window images, on this unenhanced study, there is no change in previously noted mediastinal lymph nodes. None appear pathologically enlarged. Coronary artery calcifications are present primarily in the distribution of the left anterior descending artery. No pericardial effusion is seen. The liver appears somewhat nodular peripherally and changes of cirrhosis are a consideration with splenomegaly also present. No bony abnormality is seen.  IMPRESSION: 1. The area questioned by chest x-ray in the right upper lobe probably represents radiation fibrosis. No right upper lobe lesion is seen. 2. 2 noncalcified nodules in the right middle lobe, not present on the CT from 2010, therefore worrisome for possible metastatic involvement. 3. Prominent interstitial markings primarily at the lung bases posteriorly may be chronic, but mild fluid overload cannot be excluded. 4. Nodular periphery of the liver may represent changes of cirrhosis with splenomegaly also present.   Electronically Signed  By: Ivar Drape M.D.  On: 05/16/2014 08:18   ASSESSMENT: 59 y.o. [name]   (1)  status post right mastectomy  06/08/2012 for a pT3, pN1a, stage IIIA invasive ductal carcinoma, grade 2,  estrogen and progesterone  receptor positive, HER-2/neu negative, with an MIB-1 of 20%.    (2)  treated in the adjuvant setting with docetaxel/ cyclophosphamide given every 3 weeks. There were multiple and severe complications, and the  patient tolerated only two cycles, last dose 07/29/2012  (3) letrozole started 08/16/2012  (a) osteopenia, with a T score of -1.14 April 2013  (4) postmastectomy radiation completed 12/24/2012  (5)  comorbidities include diabetes, hypertension, chronic liver disease with cirrhosis and fatty liver, and nutritional disturbance with temporarily dependence on PEG feeds (discontinued July 2014).  (6) restaging studies January 2016 show  (a) 8 mm and 4 mm Right middle lobe lung nodules, not metabolically active on PET -- Rhonda require follow-up  (b) hypermetabolic 16.6 cm mixed solid/cystic lesion in upper pelvis, with associated adenopathy  (7) status post exploratory laparotomy 06/27/2014 with total abdominal hysterectomy, bilateral salpingo-oophorectomy, para-aortic lymphadenectomy, omentectomy and radical tumor debulking for a clear cell ovarian cancer, pT1c pN1, stage IIIC .  (8) adjuvant chemotherapy consisting of carboplatin and paclitaxel given weekly days 1 and 8 of each 21 day cycle, for 6-8 cycles as tolerated, started 07/17/2014, with onpro support  (a) day 8 cycle 2 omitted and cycle 3 delayed because of an episode of Klebsiella sepsis requiring intensive care unit admission   PLAN: Monic had a very dangerous infection which nearly took her life and she is still recovering from this. Certainly we cannot start chemotherapy at this point, although we Rhonda consider resuming chemotherapy after a few more weeks. She Rhonda receive some physical therapy and hopefully she can start doing some walking, even if with a walker.   Nutrition is a major concern. She is losing weight. She has little appetite and her taste is still altered. She is still having diarrheal bowel movements, although  she is controlling them well with Imodium. I encouraged her to increase the probiotics. If the diarrhea persists we may want to repeat a C. difficile, but it was negative February 19 and March 21 of this year.  [name] is very concerned about the swelling around her ankles. Hopefully this Rhonda continue to improve. I have encouraged her to use compression stockings.  The real problem with Yazleemar is the fact that the chemotherapy I was giving her is about the best tolerated forearm that has a good chance of actually curing the cancer. As soon as we started lowering doses and increasing the treatment interval, the possibility of cure diminishes.  Nevertheless I think we Rhonda have to resume chemotherapy and most likely when she sees me again in May we Rhonda start carboplatin and paclitaxel again but given every 2 weeks with Neulasta on day 2.  Lalaine asked me when I thought she was going to be able to return to work.  Unfortunately I do not expect her to be able to return to work for at least several months and possibly never. Both physically and mentally she is in no condition to either do any physical work or do any work that requires significant concentration.  Naiya has a good understanding of this plan. She agrees with it. She knows the goal of treatment in her case is cure. She Rhonda call with any problems that may develop before her next visit here.   Chauncey Cruel, MD      08/14/2014

## 2014-08-14 ENCOUNTER — Other Ambulatory Visit: Payer: Self-pay | Admitting: *Deleted

## 2014-08-14 ENCOUNTER — Other Ambulatory Visit (HOSPITAL_BASED_OUTPATIENT_CLINIC_OR_DEPARTMENT_OTHER): Payer: BLUE CROSS/BLUE SHIELD

## 2014-08-14 ENCOUNTER — Ambulatory Visit: Payer: Self-pay | Admitting: Nurse Practitioner

## 2014-08-14 ENCOUNTER — Telehealth: Payer: Self-pay | Admitting: Oncology

## 2014-08-14 ENCOUNTER — Ambulatory Visit (HOSPITAL_BASED_OUTPATIENT_CLINIC_OR_DEPARTMENT_OTHER): Payer: BLUE CROSS/BLUE SHIELD | Admitting: Pharmacist

## 2014-08-14 ENCOUNTER — Ambulatory Visit: Payer: Self-pay

## 2014-08-14 ENCOUNTER — Telehealth: Payer: Self-pay | Admitting: *Deleted

## 2014-08-14 ENCOUNTER — Ambulatory Visit (HOSPITAL_BASED_OUTPATIENT_CLINIC_OR_DEPARTMENT_OTHER): Payer: Self-pay | Admitting: Oncology

## 2014-08-14 VITALS — BP 100/61 | HR 70 | Temp 97.9°F | Resp 18 | Ht 65.0 in | Wt 185.5 lb

## 2014-08-14 DIAGNOSIS — R634 Abnormal weight loss: Secondary | ICD-10-CM

## 2014-08-14 DIAGNOSIS — I4891 Unspecified atrial fibrillation: Secondary | ICD-10-CM

## 2014-08-14 DIAGNOSIS — C50911 Malignant neoplasm of unspecified site of right female breast: Secondary | ICD-10-CM

## 2014-08-14 DIAGNOSIS — C569 Malignant neoplasm of unspecified ovary: Secondary | ICD-10-CM | POA: Diagnosis not present

## 2014-08-14 DIAGNOSIS — R197 Diarrhea, unspecified: Secondary | ICD-10-CM

## 2014-08-14 DIAGNOSIS — E46 Unspecified protein-calorie malnutrition: Secondary | ICD-10-CM

## 2014-08-14 DIAGNOSIS — K760 Fatty (change of) liver, not elsewhere classified: Secondary | ICD-10-CM

## 2014-08-14 DIAGNOSIS — Z86718 Personal history of other venous thrombosis and embolism: Secondary | ICD-10-CM

## 2014-08-14 LAB — COMPREHENSIVE METABOLIC PANEL (CC13)
ALBUMIN: 2.1 g/dL — AB (ref 3.5–5.0)
ALT: 17 U/L (ref 0–55)
AST: 38 U/L — ABNORMAL HIGH (ref 5–34)
Alkaline Phosphatase: 162 U/L — ABNORMAL HIGH (ref 40–150)
Anion Gap: 8 mEq/L (ref 3–11)
BILIRUBIN TOTAL: 1.21 mg/dL — AB (ref 0.20–1.20)
BUN: 3.7 mg/dL — ABNORMAL LOW (ref 7.0–26.0)
CO2: 25 meq/L (ref 22–29)
Calcium: 7.7 mg/dL — ABNORMAL LOW (ref 8.4–10.4)
Chloride: 102 mEq/L (ref 98–109)
Creatinine: 0.6 mg/dL (ref 0.6–1.1)
EGFR: >90 mL/min/{1.73_m2} (ref >90)
GLUCOSE: 75 mg/dL (ref 70–140)
Potassium: 4.6 mEq/L (ref 3.5–5.1)
Sodium: 136 mEq/L (ref 136–145)
TOTAL PROTEIN: 5.4 g/dL — AB (ref 6.4–8.3)

## 2014-08-14 LAB — CBC WITH DIFFERENTIAL/PLATELET
BASO%: 0.7 % (ref 0.0–2.0)
Basophils Absolute: 0 10*3/uL (ref 0.0–0.1)
EOS%: 1.8 % (ref 0.0–7.0)
Eosinophils Absolute: 0.1 10*3/uL (ref 0.0–0.5)
HCT: 38.4 % (ref 34.8–46.6)
HGB: 12.5 g/dL (ref 11.6–15.9)
LYMPH#: 0.9 10*3/uL (ref 0.9–3.3)
LYMPH%: 16.9 % (ref 14.0–49.7)
MCH: 30.6 pg (ref 25.1–34.0)
MCHC: 32.6 g/dL (ref 31.5–36.0)
MCV: 93.9 fL (ref 79.5–101.0)
MONO#: 0.8 10*3/uL (ref 0.1–0.9)
MONO%: 14.2 % — AB (ref 0.0–14.0)
NEUT#: 3.7 10*3/uL (ref 1.5–6.5)
NEUT%: 66.4 % (ref 38.4–76.8)
Platelets: 92 10*3/uL — ABNORMAL LOW (ref 145–400)
RBC: 4.09 10*6/uL (ref 3.70–5.45)
RDW: 17.1 % — ABNORMAL HIGH (ref 11.2–14.5)
WBC: 5.6 10*3/uL (ref 3.9–10.3)

## 2014-08-14 LAB — PROTIME-INR
INR: 4.4 — ABNORMAL HIGH (ref 2.00–3.50)
PROTIME: 52.8 s — AB (ref 10.6–13.4)

## 2014-08-14 LAB — POCT INR: INR: 4.4

## 2014-08-14 NOTE — Progress Notes (Signed)
INR = 4.4 Coumadin on hold since 08/11/14 due to supratherapeutic INR (INR = 5.62 on 08/11/14) Recent hospitalization w/ Klebsiella sepsis; in ICU from 3/19 - 3/29. Pt on Bactrim DS BID.  She has watery diarrhea ongoing but the frequency is less today. Pt states she has no bleeding. She was due to be tx w/ Carbo/Taxol today but Dr. Jana Hakim is holding tx due to needing to recover & also decr Pltc.  Plan to maybe restart tx in May. Tbili elevated today.  Pt has h/o hepatic steatosis & Dr. Jana Hakim is monitoring the LFT's for now.  She appears jaundiced to me. Decreased appetite. INR elevated.  Multiple causes: Bactrim, diarrhea, elevated Tbili, decreased appetite. Pt will continue to hold Coumadin.  She has appt w/ Debbrah Alar, NP (ph# [phone]- [facility] [place]) this Fri 08/18/14.  Will coordinate for INR draw there and have results reported to [name].  We'll manage dosing- call pts husband, [name] (Ph# [phone]) w/ results & dosing instructions. Pt & husband understand plan. Kennith Center, Pharm.D., CPP 08/14/2014@1 :57 PM

## 2014-08-14 NOTE — Telephone Encounter (Signed)
per pof ot sch pt appt-gave pt copy of sch °

## 2014-08-14 NOTE — Telephone Encounter (Signed)
Noted  

## 2014-08-14 NOTE — Telephone Encounter (Signed)
Last Rx 07/12/14, #30.  Rx printed and forwarded to Provider for signature.

## 2014-08-14 NOTE — Progress Notes (Signed)
Incidentally the patient requested I send a copy of her summary to [place] at [phone]. They are looking at her disability issues.

## 2014-08-14 NOTE — Telephone Encounter (Signed)
Received call from [place] D at [facility].  Pt has f/u with [name] on Friday and they are requesting that we check pt's INR at her upcoming visit and call her at [phone] first or [phone] with result and they will adjust pt's dose if needed. Verbal ok from PCP given to [place].

## 2014-08-15 ENCOUNTER — Telehealth: Payer: Self-pay | Admitting: Family

## 2014-08-15 NOTE — Telephone Encounter (Signed)
Left detailed message on [name]'s voicemail to clarify order she is requesting. Nurse to assess swelling of foot? Pt was not seen by our office for this. Has been seeing oncology and recently in the hospital.

## 2014-08-15 NOTE — Telephone Encounter (Signed)
Danielle returned phone call. [facility] # [phone]

## 2014-08-15 NOTE — Telephone Encounter (Signed)
Rx faxed to pharmacy on 08/14/14 at 6:50pm.

## 2014-08-15 NOTE — Telephone Encounter (Signed)
Caller name:[facility] Relation to pt: Call back [phone] Pharmacy:  Reason for call: [facility] is seeing the pt for physical therapy and would like to know if an order for one of there nurse could be done, pt has swelling in her feet, she was seen last Thursday and the left foot has gotten worse.

## 2014-08-16 NOTE — Telephone Encounter (Signed)
Attempted to reach [name] and received voicemail. Left detailed message giving verbal ok to have home health nurse assess pt's swelling in feet and if other orders are needed to call and let us know.

## 2014-08-18 ENCOUNTER — Telehealth: Payer: Self-pay | Admitting: Family

## 2014-08-18 ENCOUNTER — Emergency Department (HOSPITAL_BASED_OUTPATIENT_CLINIC_OR_DEPARTMENT_OTHER)
Admission: EM | Admit: 2014-08-18 | Discharge: 2014-08-18 | Disposition: A | Discharge status: Home / Self Care | Payer: BLUE CROSS/BLUE SHIELD | Attending: Emergency Medicine | Admitting: Emergency Medicine

## 2014-08-18 ENCOUNTER — Encounter (HOSPITAL_BASED_OUTPATIENT_CLINIC_OR_DEPARTMENT_OTHER): Payer: Self-pay

## 2014-08-18 ENCOUNTER — Encounter: Payer: Self-pay | Admitting: Family

## 2014-08-18 ENCOUNTER — Ambulatory Visit (INDEPENDENT_AMBULATORY_CARE_PROVIDER_SITE_OTHER): Payer: BLUE CROSS/BLUE SHIELD | Admitting: Family

## 2014-08-18 ENCOUNTER — Ambulatory Visit (INDEPENDENT_AMBULATORY_CARE_PROVIDER_SITE_OTHER): Payer: BLUE CROSS/BLUE SHIELD | Admitting: Pharmacist

## 2014-08-18 VITALS — BP 118/80 | HR 125 | Temp 98.0°F | Resp 18 | Ht 64.0 in | Wt 166.8 lb

## 2014-08-18 DIAGNOSIS — G4733 Obstructive sleep apnea (adult) (pediatric): Secondary | ICD-10-CM | POA: Insufficient documentation

## 2014-08-18 DIAGNOSIS — Z9981 Dependence on supplemental oxygen: Secondary | ICD-10-CM | POA: Insufficient documentation

## 2014-08-18 DIAGNOSIS — Z8739 Personal history of other diseases of the musculoskeletal system and connective tissue: Secondary | ICD-10-CM | POA: Diagnosis not present

## 2014-08-18 DIAGNOSIS — I4891 Unspecified atrial fibrillation: Secondary | ICD-10-CM | POA: Insufficient documentation

## 2014-08-18 DIAGNOSIS — Z9849 Cataract extraction status, unspecified eye: Secondary | ICD-10-CM | POA: Diagnosis not present

## 2014-08-18 DIAGNOSIS — I5032 Chronic diastolic (congestive) heart failure: Secondary | ICD-10-CM

## 2014-08-18 DIAGNOSIS — F32A Depression, unspecified: Secondary | ICD-10-CM

## 2014-08-18 DIAGNOSIS — Z853 Personal history of malignant neoplasm of breast: Secondary | ICD-10-CM | POA: Insufficient documentation

## 2014-08-18 DIAGNOSIS — Z7901 Long term (current) use of anticoagulants: Secondary | ICD-10-CM | POA: Insufficient documentation

## 2014-08-18 DIAGNOSIS — E860 Dehydration: Secondary | ICD-10-CM | POA: Insufficient documentation

## 2014-08-18 DIAGNOSIS — F329 Major depressive disorder, single episode, unspecified: Secondary | ICD-10-CM

## 2014-08-18 DIAGNOSIS — C569 Malignant neoplasm of unspecified ovary: Secondary | ICD-10-CM

## 2014-08-18 DIAGNOSIS — E119 Type 2 diabetes mellitus without complications: Secondary | ICD-10-CM | POA: Insufficient documentation

## 2014-08-18 DIAGNOSIS — E039 Hypothyroidism, unspecified: Secondary | ICD-10-CM | POA: Diagnosis not present

## 2014-08-18 DIAGNOSIS — R000 Tachycardia, unspecified: Secondary | ICD-10-CM | POA: Diagnosis present

## 2014-08-18 DIAGNOSIS — K219 Gastro-esophageal reflux disease without esophagitis: Secondary | ICD-10-CM | POA: Diagnosis not present

## 2014-08-18 DIAGNOSIS — Z79899 Other long term (current) drug therapy: Secondary | ICD-10-CM | POA: Insufficient documentation

## 2014-08-18 DIAGNOSIS — I10 Essential (primary) hypertension: Secondary | ICD-10-CM | POA: Diagnosis not present

## 2014-08-18 DIAGNOSIS — Z86718 Personal history of other venous thrombosis and embolism: Secondary | ICD-10-CM | POA: Diagnosis not present

## 2014-08-18 DIAGNOSIS — I5020 Unspecified systolic (congestive) heart failure: Secondary | ICD-10-CM

## 2014-08-18 DIAGNOSIS — I4892 Unspecified atrial flutter: Secondary | ICD-10-CM | POA: Diagnosis not present

## 2014-08-18 DIAGNOSIS — R7881 Bacteremia: Secondary | ICD-10-CM

## 2014-08-18 DIAGNOSIS — I5021 Acute systolic (congestive) heart failure: Secondary | ICD-10-CM

## 2014-08-18 LAB — CBC WITH DIFFERENTIAL/PLATELET
BASOS ABS: 0.1 10*3/uL (ref 0.0–0.1)
Basophils Relative: 1 % (ref 0–1)
EOS ABS: 0.1 10*3/uL (ref 0.0–0.7)
Eosinophils Relative: 2 % (ref 0–5)
HCT: 41 % (ref 36.0–46.0)
Hemoglobin: 13.4 g/dL (ref 12.0–15.0)
LYMPHS PCT: 22 % (ref 12–46)
Lymphs Abs: 1.1 10*3/uL (ref 0.7–4.0)
MCH: 30.5 pg (ref 26.0–34.0)
MCHC: 32.7 g/dL (ref 30.0–36.0)
MCV: 93.4 fL (ref 78.0–100.0)
MONO ABS: 0.7 10*3/uL (ref 0.1–1.0)
Monocytes Relative: 13 % — ABNORMAL HIGH (ref 3–12)
NEUTROS PCT: 62 % (ref 43–77)
Neutro Abs: 3.1 10*3/uL (ref 1.7–7.7)
PLATELETS: 102 10*3/uL — AB (ref 150–400)
RBC: 4.39 MIL/uL (ref 3.87–5.11)
RDW: 17.3 % — AB (ref 11.5–15.5)
WBC: 5.1 10*3/uL (ref 4.0–10.5)

## 2014-08-18 LAB — POCT INR
INR: 2.2
INR: 2.2

## 2014-08-18 LAB — URINALYSIS, ROUTINE W REFLEX MICROSCOPIC
Bilirubin Urine: NEGATIVE
Glucose, UA: NEGATIVE mg/dL
Hgb urine dipstick: NEGATIVE
Ketones, ur: NEGATIVE mg/dL
NITRITE: NEGATIVE
PH: 7.5 (ref 5.0–8.0)
PROTEIN: NEGATIVE mg/dL
Specific Gravity, Urine: 1.009 (ref 1.005–1.030)
Urobilinogen, UA: 1 mg/dL (ref 0.0–1.0)

## 2014-08-18 LAB — COMPREHENSIVE METABOLIC PANEL
ALT: 20 U/L (ref 0–35)
AST: 40 U/L — ABNORMAL HIGH (ref 0–37)
Albumin: 2.4 g/dL — ABNORMAL LOW (ref 3.5–5.2)
Alkaline Phosphatase: 152 U/L — ABNORMAL HIGH (ref 39–117)
Anion gap: 9 (ref 5–15)
BUN: 5 mg/dL — ABNORMAL LOW (ref 6–23)
CO2: 24 mmol/L (ref 19–32)
Calcium: 7.9 mg/dL — ABNORMAL LOW (ref 8.4–10.5)
Chloride: 103 mmol/L (ref 96–112)
Creatinine, Ser: 0.68 mg/dL (ref 0.50–1.10)
GFR calc Af Amer: >90 mL/min (ref >90)
Glucose, Bld: 72 mg/dL (ref 70–99)
Potassium: 4 mmol/L (ref 3.5–5.1)
Sodium: 136 mmol/L (ref 135–145)
TOTAL PROTEIN: 6.2 g/dL (ref 6.0–8.3)
Total Bilirubin: 0.8 mg/dL (ref 0.3–1.2)

## 2014-08-18 LAB — URINE MICROSCOPIC-ADD ON

## 2014-08-18 LAB — PROTIME-INR
INR: 2.52 — AB (ref 0.00–1.49)
Prothrombin Time: 27.2 seconds — ABNORMAL HIGH (ref 11.6–15.2)

## 2014-08-18 LAB — DIGOXIN LEVEL: DIGOXIN LVL: 1 ng/mL (ref 0.8–2.0)

## 2014-08-18 MED ORDER — SODIUM CHLORIDE 0.9 % IV BOLUS (SEPSIS)
1000.0000 mL | Freq: Once | INTRAVENOUS | Status: AC
  Administered 2014-08-18: 1000 mL via INTRAVENOUS

## 2014-08-18 NOTE — Progress Notes (Signed)
Pre visit review using our clinic review tool, if applicable. No additional management support is needed unless otherwise documented below in the visit note. 

## 2014-08-18 NOTE — ED Notes (Signed)
Sent to ED by PMD this am after a follow up visit from admission.  States she is tachycardic and not eating or drinking well.  States she needs an INR checked as well.  Pt has no complaints.

## 2014-08-18 NOTE — Telephone Encounter (Signed)
I would like to see [name] back in the office next week.

## 2014-08-18 NOTE — Progress Notes (Signed)
Subjective:    Patient ID: [name], female    DOB: 1955/11/20, 59 y.o.   MRN: [id number]  HPI  [name] is a 59 yr old female with hx of breast cancer and most recently ovarian cancer on chemotherapy who presents today for hospital follow up. She was admitted 3/18-3/29/16 with febrile neutropenia.  Her hospitalization was notable for septic shock secondary to klebsiella bacteremia.  She was treated with IV cefepime, then iv rocephin and ultimately 3 more days of bactrim DS at time of discharge to complete a 14 day course. She reports that since she got home she has not been eating due to poor appetite.  Reports that she has been drinking water.  She denies pain.   AF with RVR- occurred during hospitalization and was felt to be secondary to acute infection.  She was continued on dig and metoprolol.  She needs her INR checked today.    Acute systolic CHF-  Treated with IV lasix x 1 in patient and started on oral lasix 20mg  once daily on 3/28.  She denies sob. Reports some LLE swelling. She is trying to walk daily.   HTN-   BP Readings from Last 3 Encounters:  08/18/14 118/80  08/14/14 100/61  08/08/14 109/59   Chemotherapy induced pancytopenia/ovarian and breast CA- plan per onc is to hold further chemo until she fully recovers.  Hypothyroid-   Lab Results  Component Value Date   TSH 0.514 07/28/2014   She is maintained on synthroid.  OSA- maintained on CPAP.   Hx of Depression/anxiety-  Several of her psych meds were d/c'd during her hospitalization. Her husband resumed her psych meds when she returned home. She notes that her mood is stable.  Husband notes that she is taking lexapro 20mg  alprazolam 1mg  and olanzapine 2.5 and bupropion 75.   Review of Systems See HPI  Past Medical History  Diagnosis Date  . Depression   . DVT (deep venous thrombosis)     hx of on HRT left leg ~2006  . GERD (gastroesophageal reflux disease)   . Hyperlipidemia   . Hypertension   .  Hypothyroidism   . PPD positive, treated     rx inh   . Diabetes mellitus   . 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 DJD (degenerative joint disease) of lumbar spine   . Chronic diastolic CHF (congestive heart failure)     a. 08/2012 Echo: EF 55-60%, no rwma, mod MR.  [name] A-fib     a. on amio for rate control/coumadin.  [name] Physical deconditioning   . Allergy   . Anemia     low iron hx  . Blood transfusion without reported diagnosis     2 u prbc  . Cataract     removed ou  . OSA on CPAP     cpap 4.5 setting  . Breast cancer 2014    a. 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 Marital Status: Married    Spouse Name: N/A  . Number of Children: 2  . Years of Education: N/A   Occupational History  . [place] History Main Topics  . Smoking status: Former Smoker -- 1.00 packs/day for 1 years  . Smokeless tobacco: Never Used  . Alcohol Use: No  .  Drug Use: No     Comment: quit age 57 only smoked as a teen 1 year  . Sexual Activity: No   Other Topics Concern  . Not on file   Social History Narrative   Married   Works at [employer] secretary   Daily caffeine use - 2 cups a day plus a couple of sodas a day   Pt doesn't exercise regularly   G2P2   H H of 5 soon to be 6 .     Past Surgical History  Procedure Laterality Date  . Tubal ligation    . Cholecystectomy    . Foot surgery    . Eye surgery    . Cataract extraction    . Percutaneous liver biopsy    . Breast biopsy      left breast  . Mastectomy modified radical  06/08/2012    Procedure: MASTECTOMY MODIFIED RADICAL;  Surgeon: Edward Jolly, MD;  Location: [place];  Service: General;  Laterality: Right;  . Portacath placement  06/08/2012    Procedure: INSERTION PORT-A-CATH;  Surgeon: Edward Jolly, MD;  Location: [place];   Service: General;  Laterality: Left;  . Peg tube placement      peg removed 2015  . History of chemotherapy x 2 treatments, radiation tx  2014  . Pac removed  2015  . Laparotomy N/A 06/27/2014    Procedure: EXPLORATORY LAPAROTOMY;  Surgeon: Everitt Amber, MD;  Location: [facility];  Service: Gynecology;  Laterality: N/A;  . Abdominal hysterectomy N/A 06/27/2014    Procedure: HYSTERECTOMY ABDOMINAL TOTAL;  Surgeon: Everitt Amber, MD;  Location: [facility];  Service: Gynecology;  Laterality: N/A;  . Salpingoophorectomy Bilateral 06/27/2014    Procedure: BILATERAL SALPINGO OOPHORECTOMY/TUMOR DEBULKING/LYMPHNODE DISSECTION, OMENTECTOMY;  Surgeon: Everitt Amber, MD;  Location[facility];  Service: Gynecology;  Laterality: Bilateral;    Family History  Problem Relation Age of Onset  . Diabetes Mother   . Hypertension Mother   . Arthritis Mother   . Heart disease Mother   . Heart failure Mother   . Other Mother     benign breast mass  . Stroke Father   . Heart disease Father   . Diabetes Paternal Grandmother   . Colon cancer Paternal Grandfather     Allergies  Allergen Reactions  . Olmesartan Medoxomil Cough    REACTION: ? if cough  . Tetracycline Hcl     Unknown reaction, too long for patient to remember   . Venlafaxine     REACTION: severe dry moouth  . Adhesive [Tape] Rash    Current Outpatient Prescriptions on File Prior to Visit  Medication Sig Dispense Refill  . ALPRAZolam (XANAX XR) 1 MG 24 hr tablet Take 1 tablet (1 mg total) by mouth 2 (two) times daily. 60 tablet 1  . clonazePAM (KLONOPIN) 0.5 MG tablet TAKE 1/2 TABLET BY MOUTH TWICE DAILY AS NEEDED 30 tablet 0  . digoxin (LANOXIN) 0.25 MG tablet Take 1 tablet (0.25 mg total) by mouth daily. 30 tablet 0  . famotidine (PEPCID) 20 MG tablet Take 1 tablet (20 mg total) by mouth daily. (Patient taking differently: Take 20 mg by mouth daily as needed. ) 30 tablet 0  . furosemide (LASIX) 20 MG tablet Take 1 tablet (20 mg total) by mouth daily. 90  tablet 1  . letrozole (FEMARA) 2.5 MG tablet Take 1 tablet (2.5 mg total) by mouth daily. 90 tablet 0  . levothyroxine (SYNTHROID, LEVOTHROID) 150 MCG tablet  Take 1 tablet (150 mcg total) by mouth daily. 90 tablet 1  . lidocaine-prilocaine (EMLA) cream Apply over port area 1-2 hours before chemotherapy 30 g 0  . loperamide (IMODIUM A-D) 2 MG tablet Take 1 tablet (2 mg total) by mouth 3 (three) times daily as needed for diarrhea or loose stools. 15 tablet 0  . metoprolol (LOPRESSOR) 50 MG tablet Take 1 tablet (50 mg total) by mouth 2 (two) times daily. 60 tablet 0  . ondansetron (ZOFRAN) 8 MG tablet Take 1 tablet (8 mg total) by mouth every 8 (eight) hours as needed for nausea or vomiting.    . potassium chloride (K-DUR) 10 MEQ tablet Take 10 mEq by mouth 2 (two) times daily.  3  . saccharomyces boulardii (FLORASTOR) 250 MG capsule Take 1 capsule (250 mg total) by mouth 2 (two) times daily. 30 capsule 0  . warfarin (COUMADIN) 3 MG tablet Take 1 tablet (3 mg total) by mouth daily at 6 PM. Or as directed by coumadin clinic.     Current Facility-Administered Medications on File Prior to Visit  Medication Dose Route Frequency Provider Last Rate Last Dose  . sodium chloride 0.9 % injection 10 mL  10 mL Intracatheter PRN Chauncey Cruel, MD   10 mL at 07/24/14 1523    BP 118/80 mmHg  Pulse 125  Temp(Src) 98 F (36.7 C) (Oral)  Resp 18  Ht 5\' 4"  (1.626 m)  Wt 166 lb 12.8 oz (75.66 kg)  BMI 28.62 kg/m2  SpO2 99%       Objective:   Physical Exam  Constitutional: She is oriented to person, place, and time.  Ill appearing female  Cardiovascular: An irregularly irregular rhythm present. Tachycardia present.   Pulmonary/Chest: Effort normal and breath sounds normal. No respiratory distress. She has no wheezes. She has no rales.  Abdominal: Soft. She exhibits no distension. There is no tenderness. There is no rebound.  Musculoskeletal:  2+ left pedal edema.    Neurological: She is alert and  oriented to person, place, and time.  Skin: Skin is warm and dry.  Psychiatric: Her behavior is normal. Judgment and thought content normal.  Slightly flat affect.            Assessment & Plan:   Report give to [facility] charge RN in med center ED.

## 2014-08-18 NOTE — ED Provider Notes (Signed)
CSN: [id number]     Arrival date & time 08/18/14  1110 History   First MD Initiated Contact with Patient 08/18/14 1124     Chief Complaint  Patient presents with  . Dehydration     (Consider location/radiation/quality/duration/timing/severity/associated sxs/prior Treatment) HPI  This is a 59 year old female with ovarian cancer, atrial fibrillation, congestive heart failure who presents with tachycardia and dehydration. Patient was seen and evaluated at her primary care physicians and found to have a heart rate in the 120s. Patient reports decreased by mouth intake at home. She denies chest pain, shortness of breath, palpitations. Denies any lower extremity swelling. States that she feels at her baseline. Does not feel that she is very hydrated at this time. Has been taking her medications including metoprolol as directed. Office visit today was for follow-up from recent hospital admission.  Past Medical History  Diagnosis Date  . Depression   . DVT (deep venous thrombosis)     hx of on HRT left leg ~2006  . GERD (gastroesophageal reflux disease)   . Hyperlipidemia   . Hypertension   . Hypothyroidism   . PPD positive, treated     rx inh   . Diabetes mellitus   . 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 DJD (degenerative joint disease) of lumbar spine   . Chronic diastolic CHF (congestive heart failure)     a. 08/2012 Echo: EF 55-60%, no rwma, mod MR.  [name] A-fib     a. on amio for rate control/coumadin.  [name] Physical deconditioning   . Allergy   . Anemia     low iron hx  . Blood transfusion without reported diagnosis     2 u prbc  . Cataract     removed ou  . OSA on CPAP     cpap 4.5 setting  . Breast cancer 2014    a. 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 Tubal ligation    . Cholecystectomy    .  Foot surgery    . Eye surgery    . Cataract extraction    . Percutaneous liver biopsy    . Breast biopsy      left breast  . Mastectomy modified radical  06/08/2012    Procedure: MASTECTOMY MODIFIED RADICAL;  Surgeon: Edward Jolly, MD;  Location: [facility];  Service: General;  Laterality: Right;  . Portacath placement  06/08/2012    Procedure: INSERTION PORT-A-CATH;  Surgeon: Edward Jolly, MD;  Location: [facility];  Service: General;  Laterality: Left;  . Peg tube placement      peg removed 2015  . History of chemotherapy x 2 treatments, radiation tx  2014  . Pac removed  2015  . Laparotomy N/A 06/27/2014    Procedure: EXPLORATORY LAPAROTOMY;  Surgeon: Everitt Amber, MD;  [facility];  Service: Gynecology;  Laterality: N/A;  . Abdominal hysterectomy N/A 06/27/2014    Procedure: HYSTERECTOMY ABDOMINAL TOTAL;  Surgeon: Everitt Amber, MD;  [facility];  Service: Gynecology;  Laterality: N/A;  . Salpingoophorectomy Bilateral 06/27/2014    Procedure: BILATERAL SALPINGO OOPHORECTOMY/TUMOR DEBULKING/LYMPHNODE DISSECTION, OMENTECTOMY;  Surgeon: Everitt Amber, MD;  [facility];  Service: Gynecology;  Laterality: Bilateral;   Family History  Problem Relation Age of Onset  . Diabetes Mother   . Hypertension Mother   .  Arthritis Mother   . Heart disease Mother   . Heart failure Mother   . Other Mother     benign breast mass  . Stroke Father   . Heart disease Father   . Diabetes Paternal Grandmother   . Colon cancer Paternal Grandfather    History  Substance Use Topics  . Smoking status: Former Smoker -- 1.00 packs/day for 1 years  . Smokeless tobacco: Never Used  . Alcohol Use: No   OB History    Gravida Para Term Preterm AB TAB SAB Ectopic Multiple Living   2 2             Review of Systems  Constitutional: Positive for appetite change. Negative for fever.  Respiratory: Negative for cough, chest tightness and shortness of breath.   Cardiovascular: Negative for chest pain,  palpitations and leg swelling.  Gastrointestinal: Negative for nausea, vomiting and abdominal pain.  Genitourinary: Negative for dysuria and difficulty urinating.  Musculoskeletal: Negative for back pain.  Neurological: Negative for headaches.  All other systems reviewed and are negative.     Allergies  Olmesartan medoxomil; Tetracycline hcl; Venlafaxine; and Adhesive  Home Medications   Prior to Admission medications   Medication Sig Start Date End Date Taking? Authorizing Provider  ALPRAZolam (XANAX XR) 1 MG 24 hr tablet Take 1 tablet (1 mg total) by mouth 2 (two) times daily. 07/05/14   Chauncey Cruel, MD  clonazePAM (KLONOPIN) 0.5 MG tablet TAKE 1/2 TABLET BY MOUTH TWICE DAILY AS NEEDED 08/14/14   Debbrah Alar, NP  dexamethasone (DECADRON) 4 MG tablet Take 2 tablets daily with a meal.  START THE DAY AFTER CHEMO FOR 3 DAYS 07/14/14   Historical Provider, MD  digoxin (LANOXIN) 0.25 MG tablet Take 1 tablet (0.25 mg total) by mouth daily. 08/08/14   Modena Jansky, MD  escitalopram (LEXAPRO) 20 MG tablet Take 20 mg by mouth daily.    Historical Provider, MD  famotidine (PEPCID) 20 MG tablet Take 1 tablet (20 mg total) by mouth daily. Patient taking differently: Take 20 mg by mouth daily as needed.  08/08/14   Modena Jansky, MD  furosemide (LASIX) 20 MG tablet Take 1 tablet (20 mg total) by mouth daily. 05/23/14   Debbrah Alar, NP  letrozole (FEMARA) 2.5 MG tablet Take 1 tablet (2.5 mg total) by mouth daily. 05/22/14   Chauncey Cruel, MD  levothyroxine (SYNTHROID, LEVOTHROID) 150 MCG tablet Take 1 tablet (150 mcg total) by mouth daily. 05/23/14   Debbrah Alar, NP  lidocaine-prilocaine (EMLA) cream Apply over port area 1-2 hours before chemotherapy 07/14/14   Chauncey Cruel, MD  loperamide (IMODIUM A-D) 2 MG tablet Take 1 tablet (2 mg total) by mouth 3 (three) times daily as needed for diarrhea or loose stools. 08/08/14   Modena Jansky, MD  metoprolol (LOPRESSOR) 50  MG tablet Take 1 tablet (50 mg total) by mouth 2 (two) times daily. 08/08/14   Modena Jansky, MD  OLANZapine (ZYPREXA) 2.5 MG tablet Take 2.5 mg by mouth at bedtime.    Historical Provider, MD  ondansetron (ZOFRAN) 8 MG tablet Take 1 tablet (8 mg total) by mouth every 8 (eight) hours as needed for nausea or vomiting. 08/08/14   Modena Jansky, MD  potassium chloride (K-DUR) 10 MEQ tablet Take 10 mEq by mouth 2 (two) times daily. 04/18/14   Historical Provider, MD  saccharomyces boulardii (FLORASTOR) 250 MG capsule Take 1 capsule (250 mg total) by mouth 2 (two)  times daily. 08/08/14   Modena Jansky, MD  warfarin (COUMADIN) 3 MG tablet Take 1 tablet (3 mg total) by mouth daily at 6 PM. Or as directed by coumadin clinic. 08/08/14   Modena Jansky, MD   Pulse 106  Temp(Src) 97.7 F (36.5 C) (Oral)  Resp 16  Ht 5\' 5"  (1.651 m)  Wt 166 lb (75.297 kg)  BMI 27.62 kg/m2  SpO2 99% Physical Exam  Constitutional: She is oriented to person, place, and time.  Chronically ill-appearing, no acute distress  HENT:  Head: Normocephalic and atraumatic.  Alopecia  Eyes: Pupils are equal, round, and reactive to light.  Cardiovascular: Normal heart sounds.   Tachycardia, irregular rhythm  Pulmonary/Chest: Effort normal and breath sounds normal. No respiratory distress. She has no wheezes.  Abdominal: Soft. Bowel sounds are normal. There is no tenderness. There is no rebound and no guarding.  Musculoskeletal: She exhibits no edema.  Neurological: She is alert and oriented to person, place, and time.  Skin: Skin is warm and dry.  Psychiatric: She has a normal mood and affect.  Nursing note and vitals reviewed.   ED Course  Procedures (including critical care time) Labs Review Labs Reviewed  CBC WITH DIFFERENTIAL/PLATELET - Abnormal; Notable for the following:    RDW 17.3 (*)    Platelets 102 (*)    All other components within normal limits  PROTIME-INR - Abnormal; Notable for the following:     Prothrombin Time 27.2 (*)    INR 2.52 (*)    All other components within normal limits  COMPREHENSIVE METABOLIC PANEL  URINALYSIS, ROUTINE W REFLEX MICROSCOPIC    Imaging Review No results found.   EKG Interpretation   Date/Time:  Friday August 18 2014 11:37:39 EDT Ventricular Rate:  117 PR Interval:    QRS Duration: 96 QT Interval:  298 QTC Calculation: 415 R Axis:   81 Text Interpretation:  Atrial flutter with variable A-V block Nonspecific  ST and T wave abnormality Abnormal ECG Confirmed by HORTON  MD, [place]  ([id number]) on 08/18/2014 12:39:09 PM      MDM   Final diagnoses:  Dehydration  Atrial fibrillation and flutter    Patient presents with tachycardia.  In atrial fibrillation with RVR. Reports medication compliance. Is chronically ill with ovarian cancer and reports decreased by mouth intake. Appears dry on exam. Patient given 2 L of fluid. Lab work is reassuring. Patient would like to go home. Suspect tachycardia driven by dehydration. Repeat evaluation, patient's heart rate is in the low 100s.  Digoxin level is pending. Anticipate discharge with close PCP follow-up.    Merryl Hacker, MD 08/21/14 [phone]

## 2014-08-18 NOTE — Progress Notes (Signed)
INR = 2.2 - Drawn at PCP office this AM; [facility] [place].  (Thank you Debbrah Alar & staff!) I s/w pts husband, [name] over phone & they are actually still at the PCP office.  He reports his wife has tachycardia and she will be going to the ED at [facility] for evaluation & possible re-admission if necessary.  They feel pt is dehydrated and will start w/ IVF. Pts husband says his wife now understands the importance of trying to eat & she plans to do so today.  She has just been drinking water before now. No development of bleeding/bruising since seen earlier this week w/ supratherapeutic INR.  Pt is off Bactrim. No med changes at PCP office so far today. INR back at goal.  Off Bactrim but pt has elevated Tbili & decreased appetite along w/ diarrhea.  I'm changing her Coumadin dose to 4.5 mg daily (previously she took 6 mg/day except 3 mg MWF). Return in 1 week for protime check.  Pts husband understands plan/dosing regimen. NO CHARGE - phone encounter. Kennith Center, Pharm.D., CPP 08/18/2014@11 :08 AM

## 2014-08-18 NOTE — Assessment & Plan Note (Signed)
59 yr old female with hx of AF presents with AF/RVR.  Initially pt had not taken her AM meds.  She took the meds and we waited 40 minutes. HR remains 120-130.  Suspect some dehydration.  I have advised pt to go to the ED for further evaluation.  INR is therapeutic.  Will forward to hematology at their request.

## 2014-08-18 NOTE — Patient Instructions (Addendum)
Stop clonazepam. Continue alprazolam. Proceed to ED for further evaluation- they are expecting you.

## 2014-08-18 NOTE — Discharge Instructions (Signed)
Dehydration, Adult Dehydration is when you lose more fluids from the body than you take in. Vital organs like the kidneys, brain, and heart cannot function without a proper amount of fluids and salt. Any loss of fluids from the body can cause dehydration.  CAUSES   Vomiting.  Diarrhea.  Excessive sweating.  Excessive urine output.  Fever. SYMPTOMS  Mild dehydration  Thirst.  Dry lips.  Slightly dry mouth. Moderate dehydration  Very dry mouth.  Sunken eyes.  Skin does not bounce back quickly when lightly pinched and released.  Dark urine and decreased urine production.  Decreased tear production.  Headache. Severe dehydration  Very dry mouth.  Extreme thirst.  Rapid, weak pulse (more than 100 beats per minute at rest).  Cold hands and feet.  Not able to sweat in spite of heat and temperature.  Rapid breathing.  Blue lips.  Confusion and lethargy.  Difficulty being awakened.  Minimal urine production.  No tears. DIAGNOSIS  Your caregiver will diagnose dehydration based on your symptoms and your exam. Blood and urine tests will help confirm the diagnosis. The diagnostic evaluation should also identify the cause of dehydration. TREATMENT  Treatment of mild or moderate dehydration can often be done at home by increasing the amount of fluids that you drink. It is best to drink small amounts of fluid more often. Drinking too much at one time can make vomiting worse. Refer to the home care instructions below. Severe dehydration needs to be treated at the hospital where you will probably be given intravenous (IV) fluids that contain water and electrolytes. HOME CARE INSTRUCTIONS   Ask your caregiver about specific rehydration instructions.  Drink enough fluids to keep your urine clear or pale yellow.  Drink small amounts frequently if you have nausea and vomiting.  Eat as you normally do.  Avoid:  Foods or drinks high in sugar.  Carbonated  drinks.  Juice.  Extremely hot or cold fluids.  Drinks with caffeine.  Fatty, greasy foods.  Alcohol.  Tobacco.  Overeating.  Gelatin desserts.  Wash your hands well to avoid spreading bacteria and viruses.  Only take over-the-counter or prescription medicines for pain, discomfort, or fever as directed by your caregiver.  Ask your caregiver if you should continue all prescribed and over-the-counter medicines.  Keep all follow-up appointments with your caregiver. SEEK MEDICAL CARE IF:  You have abdominal pain and it increases or stays in one area (localizes).  You have a rash, stiff neck, or severe headache.  You are irritable, sleepy, or difficult to awaken.  You are weak, dizzy, or extremely thirsty. SEEK IMMEDIATE MEDICAL CARE IF:   You are unable to keep fluids down or you get worse despite treatment.  You have frequent episodes of vomiting or diarrhea.  You have blood or green matter (bile) in your vomit.  You have blood in your stool or your stool looks black and tarry.  You have not urinated in 6 to 8 hours, or you have only urinated a small amount of very dark urine.  You have a fever.  You faint. MAKE SURE YOU:   Understand these instructions.  Will watch your condition.  Will get help right away if you are not doing well or get worse. Document Released: 04/28/2005 Document Revised: 07/21/2011 Document Reviewed: 12/16/2010 ExitCare Patient Information 2015 [employer]. This information is not intended to replace advice given to you by your health care provider. Make sure you discuss any questions you have with your health care   provider.  

## 2014-08-20 NOTE — Assessment & Plan Note (Signed)
Currently euvolemic.  Monitor.

## 2014-08-20 NOTE — Assessment & Plan Note (Addendum)
Depression- stable, continue lexapro, zyprexa, advised pt and husband to stop klonazepam and continue alprazolam.

## 2014-08-20 NOTE — Assessment & Plan Note (Signed)
Management per oncology. 

## 2014-08-21 ENCOUNTER — Telehealth: Payer: Self-pay | Admitting: *Deleted

## 2014-08-21 ENCOUNTER — Telehealth: Payer: Self-pay | Admitting: Family

## 2014-08-21 ENCOUNTER — Telehealth: Payer: Self-pay | Admitting: Pharmacist

## 2014-08-21 ENCOUNTER — Encounter: Payer: Self-pay | Admitting: Oncology

## 2014-08-21 NOTE — Telephone Encounter (Signed)
I called [place] (ph# [phone]) to determine if pt has services capable of drawing PT/INR at her home & was told that she is getting physical therapy in home.  The physical therapists w/ [facility] are able to draw PT/INR's.  Order faxed for PT/INR draws [facility] fax# [phone]; AttnEra Skeen) by PT and they'll draw on 08/23/14 & call/fax results to [facility]. I left vm for pts husband, [name] to inform them of this change and that they do NOT need to come to [facility] for lab/CC on Fri 08/25/14.  In the vm, I told him we'd call them Wed (4/13) w/ results & dosing instructions. This will save pt a trip out since she's not feeling well. Kennith Center, Pharm.D., CPP 08/21/2014@12 :17 PM

## 2014-08-21 NOTE — Progress Notes (Signed)
I placed disability forms on the desk of nurse for dr Jana Hakim. I faxed office notes/labs from 05/27/14-present to [phone] already this morning

## 2014-08-21 NOTE — Telephone Encounter (Signed)
Received home health certification and plan of care via fax from [place]. Forwarded to [employer]. JG//CMA

## 2014-08-21 NOTE — Telephone Encounter (Signed)
Received call from [place] at [place] requesting verbal to send nurse to assess pt. Gave verbal ok per 08/18/14 home health referral.

## 2014-08-21 NOTE — Telephone Encounter (Signed)
Spoke with pt and scheduled her f/u on 08/22/14 at 10am.

## 2014-08-21 NOTE — Telephone Encounter (Signed)
Patient stated she did not know she had an appointment, so she was very concerned that something was wrong.  Notified patient that there were no new problems, her provider wanted to follow-up after her ED visit.  Patient stated understanding and states she will see Melissa in follow-up appointment.

## 2014-08-21 NOTE — Telephone Encounter (Signed)
Caller name: Bouch,[name] Relation to pt: spouse  Call back number: [phone]   Reason for call:  Spouse called on behalf of wife regarding ED following up appointment.

## 2014-08-22 ENCOUNTER — Encounter: Payer: Self-pay | Admitting: Family

## 2014-08-22 ENCOUNTER — Ambulatory Visit (INDEPENDENT_AMBULATORY_CARE_PROVIDER_SITE_OTHER): Payer: BLUE CROSS/BLUE SHIELD | Admitting: Family

## 2014-08-22 VITALS — BP 118/72 | HR 53 | Temp 98.3°F | Resp 16 | Ht 64.0 in | Wt 157.8 lb

## 2014-08-22 DIAGNOSIS — I4891 Unspecified atrial fibrillation: Secondary | ICD-10-CM

## 2014-08-22 DIAGNOSIS — R634 Abnormal weight loss: Secondary | ICD-10-CM | POA: Diagnosis not present

## 2014-08-22 MED ORDER — GLUCERNA 1.0 CAL PO LIQD: 1.0000 | Freq: Three times a day (TID) | ORAL | Status: DC

## 2014-08-22 MED ORDER — MEGESTROL ACETATE 400 MG/10ML PO SUSP: 400.0000 mg | Freq: Every day | ORAL | Status: DC

## 2014-08-22 NOTE — Patient Instructions (Signed)
Start glucerna three times daily. Start megace once daily to help with your appetite. Follow up in 1 week.

## 2014-08-22 NOTE — Progress Notes (Signed)
Pre visit review using our clinic review tool, if applicable. No additional management support is needed unless otherwise documented below in the visit note. 

## 2014-08-22 NOTE — Progress Notes (Signed)
Subjective:    Patient ID: [name], female    DOB: [birth year], 59 y.o.   MRN: [id number]  HPI  [name] is a 59 yr old female who presents today for ED follow up.  She was seen on 4/8 and noted to be dehydrated with Rapid AF. She was sent to the ED.  They gave her 2 liters of IVF and her heart rate came down to the low 100's.  Dig level was noted to be therapeutic.  She had mild hypocalcemia, mild elevation of AST and mild thrombocytopenia with plt count of 102 k. INR found to be therapeutic at 2.52.  Wt Readings from Last 3 Encounters:  08/22/14 157 lb 12.8 oz (71.578 kg)  08/18/14 166 lb (75.297 kg)  08/18/14 166 lb 12.8 oz (75.66 kg)   Since she returned home from the ED she reports feeling good. Walked yesterday without cane with the therapist.  Feeling much stronger. More mobile. Still no appetite.  She reports that she is drinking liquids, had "almost a whole gatorade" last night.    Review of Systems See HPI  Past Medical History  Diagnosis Date  . Depression   . DVT (deep venous thrombosis)     hx of on HRT left leg ~2006  . GERD (gastroesophageal reflux disease)   . Hyperlipidemia   . Hypertension   . Hypothyroidism   . PPD positive, treated     rx inh   . Diabetes mellitus   . 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 DJD (degenerative joint disease) of lumbar spine   . Chronic diastolic CHF (congestive heart failure)     a. 08/2012 Echo: EF 55-60%, no rwma, mod MR.  [name] A-fib     a. on amio for rate control/coumadin.  [name] Physical deconditioning   . Allergy   . Anemia     low iron hx  . Blood transfusion without reported diagnosis     2 u prbc  . Cataract     removed ou  . OSA on CPAP     cpap 4.5 setting  . Breast cancer 2014    a. 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 Marital Status:  Married    Spouse Name: N/A  . Number of Children: 2  . Years of Education: N/A   Occupational History  . [place] History Main Topics  . Smoking status: Former Smoker -- 1.00 packs/day for 1 years  . Smokeless tobacco: Never Used  . Alcohol Use: No  . Drug Use: No     Comment: quit age 69 only smoked as a teen 1 year  . Sexual Activity: No   Other Topics Concern  . 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     Past Surgical History  Procedure Laterality Date  . Tubal ligation    . Cholecystectomy    . Foot surgery    . Eye surgery    . Cataract extraction    . Percutaneous liver  biopsy    . Breast biopsy      left breast  . Mastectomy modified radical  06/08/2012    Procedure: MASTECTOMY MODIFIED RADICAL;  Surgeon: Edward Jolly, MD;  Location: [facility];  Service: General;  Laterality: Right;  . Portacath placement  06/08/2012    Procedure: INSERTION PORT-A-CATH;  Surgeon: Edward Jolly, MD;  Location: [facility];  Service: General;  Laterality: Left;  . Peg tube placement      peg removed 2015  . History of chemotherapy x 2 treatments, radiation tx  2014  . Pac removed  2015  . Laparotomy N/A 06/27/2014    Procedure: EXPLORATORY LAPAROTOMY;  Surgeon: Everitt Amber, MD;  Location: [facility];  Service: Gynecology;  Laterality: N/A;  . Abdominal hysterectomy N/A 06/27/2014    Procedure: HYSTERECTOMY ABDOMINAL TOTAL;  Surgeon: Everitt Amber, MD;  Location: [facility];  Service: Gynecology;  Laterality: N/A;  . Salpingoophorectomy Bilateral 06/27/2014    Procedure: BILATERAL SALPINGO OOPHORECTOMY/TUMOR DEBULKING/LYMPHNODE DISSECTION, OMENTECTOMY;  Surgeon: Everitt Amber, MD;  Location: [facility];  Service: Gynecology;  Laterality: Bilateral;    Family History  Problem Relation Age of Onset    . Diabetes Mother   . Hypertension Mother   . Arthritis Mother   . Heart disease Mother   . Heart failure Mother   . Other Mother     benign breast mass  . Stroke Father   . Heart disease Father   . Diabetes Paternal Grandmother   . Colon cancer Paternal Grandfather     Allergies  Allergen Reactions  . Olmesartan Medoxomil Cough    REACTION: ? if cough  . Tetracycline Hcl     Unknown reaction, too long for patient to remember   . Venlafaxine     REACTION: severe dry moouth  . Adhesive [Tape] Rash    Current Outpatient Prescriptions on File Prior to Visit  Medication Sig Dispense Refill  . ALPRAZolam (XANAX XR) 1 MG 24 hr tablet Take 1 tablet (1 mg total) by mouth 2 (two) times daily. 60 tablet 1  . clonazePAM (KLONOPIN) 0.5 MG tablet TAKE 1/2 TABLET BY MOUTH TWICE DAILY AS NEEDED 30 tablet 0  . dexamethasone (DECADRON) 4 MG tablet Take 2 tablets daily with a meal.  START THE DAY AFTER CHEMO FOR 3 DAYS  1  . digoxin (LANOXIN) 0.25 MG tablet Take 1 tablet (0.25 mg total) by mouth daily. 30 tablet 0  . escitalopram (LEXAPRO) 20 MG tablet Take 20 mg by mouth daily.    . famotidine (PEPCID) 20 MG tablet Take 1 tablet (20 mg total) by mouth daily. (Patient taking differently: Take 20 mg by mouth daily as needed. ) 30 tablet 0  . furosemide (LASIX) 20 MG tablet Take 1 tablet (20 mg total) by mouth daily. 90 tablet 1  . letrozole (FEMARA) 2.5 MG tablet Take 1 tablet (2.5 mg total) by mouth daily. 90 tablet 0  . levothyroxine (SYNTHROID, LEVOTHROID) 150 MCG tablet Take 1 tablet (150 mcg total) by mouth daily. 90 tablet 1  . lidocaine-prilocaine (EMLA) cream Apply over port area 1-2 hours before chemotherapy 30 g 0  . loperamide (IMODIUM A-D) 2 MG tablet Take 1 tablet (2 mg total) by mouth 3 (three) times daily as needed for diarrhea or loose stools. 15 tablet 0  . metoprolol (LOPRESSOR) 50 MG tablet Take 1 tablet (50 mg total) by mouth 2 (two) times daily. 60 tablet 0  . OLANZapine  (ZYPREXA) 2.5 MG tablet Take 2.5  mg by mouth at bedtime.    . ondansetron (ZOFRAN) 8 MG tablet Take 1 tablet (8 mg total) by mouth every 8 (eight) hours as needed for nausea or vomiting.    . potassium chloride (K-DUR) 10 MEQ tablet Take 10 mEq by mouth 2 (two) times daily.  3  . saccharomyces boulardii (FLORASTOR) 250 MG capsule Take 1 capsule (250 mg total) by mouth 2 (two) times daily. 30 capsule 0  . warfarin (COUMADIN) 3 MG tablet Take 1 tablet (3 mg total) by mouth daily at 6 PM. Or as directed by coumadin clinic.     Current Facility-Administered Medications on File Prior to Visit  Medication Dose Route Frequency Provider Last Rate Last Dose  . sodium chloride 0.9 % injection 10 mL  10 mL Intracatheter PRN Chauncey Cruel, MD   10 mL at 07/24/14 1523    BP 118/72 mmHg  Pulse 53  Temp(Src) 98.3 F (36.8 C) (Oral)  Resp 16  Ht 5\' 4"  (1.626 m)  Wt 157 lb 12.8 oz (71.578 kg)  BMI 27.07 kg/m2  SpO2 93%       Objective:   Physical Exam  Constitutional: She is oriented to person, place, and time.  Chronically ill appearing female, NAD  HENT:  Head: Normocephalic and atraumatic.  Cardiovascular: Normal rate.  An irregular rhythm present.  Pulmonary/Chest: Effort normal and breath sounds normal. No respiratory distress. She has no wheezes. She has no rales. She exhibits no tenderness.  Musculoskeletal: She exhibits no edema.  Neurological: She is alert and oriented to person, place, and time.  Psychiatric: She has a normal mood and affect. Her behavior is normal. Judgment and thought content normal.          Assessment & Plan:

## 2014-08-22 NOTE — Assessment & Plan Note (Signed)
Resolved, rate is now stable.

## 2014-08-22 NOTE — Assessment & Plan Note (Signed)
Does not appear clinically dehydrated today, however her weight is down significantly. Prior to her hospitalization her weight was over 190 and now she is downto 157.    I have advised her to add glucerna tid, continue liquids and solids as able, add megace for appetite stimulation.  Will forward to Dr. Jana Hakim her oncologist so he is also aware.  I have advised the pt to weigh herself daily at home and contact me if she has further weight loss.  Will bring her back in in 1 week for re-evaluation.

## 2014-08-23 ENCOUNTER — Encounter: Payer: Self-pay | Admitting: Oncology

## 2014-08-23 ENCOUNTER — Ambulatory Visit (INDEPENDENT_AMBULATORY_CARE_PROVIDER_SITE_OTHER): Payer: BLUE CROSS/BLUE SHIELD | Admitting: Pharmacist

## 2014-08-23 DIAGNOSIS — I4891 Unspecified atrial fibrillation: Secondary | ICD-10-CM

## 2014-08-23 DIAGNOSIS — Z86718 Personal history of other venous thrombosis and embolism: Secondary | ICD-10-CM

## 2014-08-23 DIAGNOSIS — C569 Malignant neoplasm of unspecified ovary: Secondary | ICD-10-CM

## 2014-08-23 LAB — POCT INR: INR: 5.4

## 2014-08-23 NOTE — Progress Notes (Signed)
*  Telephone Encounter Only  - NO CHARGE*  [facility] RN, Caryl Pina called in results.  INR = 5.4     Goal 2-3 INR above goal range. No complications of anticoagulation noted. Only new medication is Megace for appetite stimulation. Megace may increase coagulability but should not affect INR directly. Patient has not been eating well per RN. She ate a salad yesterday and has had some Ensure. RN states she can visit and draw INR next week if needed. I would like to draw INR on Monday 4/18, RN states this is okay. No Coumadin today or tomorrow.   Then, begin decreased dose of Coumadin 3 mg daily. Will recheck INR Monday 4/18. [facility] will call results to Coumadin clinic.  Theone Murdoch, PharmD

## 2014-08-23 NOTE — Progress Notes (Signed)
I faxed disability forms to [place] [employer] [phone].

## 2014-08-25 ENCOUNTER — Telehealth: Payer: Self-pay | Admitting: Family

## 2014-08-25 ENCOUNTER — Ambulatory Visit: Payer: Self-pay

## 2014-08-25 ENCOUNTER — Other Ambulatory Visit: Payer: Self-pay

## 2014-08-25 MED ORDER — METOPROLOL TARTRATE 50 MG PO TABS: 50.0000 mg | ORAL_TABLET | Freq: Two times a day (BID) | ORAL | Status: DC

## 2014-08-25 MED ORDER — DIGOXIN 250 MCG PO TABS: 0.2500 mg | ORAL_TABLET | Freq: Every day | ORAL | Status: DC

## 2014-08-25 NOTE — Telephone Encounter (Signed)
Caller name: Nevills,[name] Relation to pt: spouse  Call back number: [phone] Pharmacy: [facility]  Reason for call:  Spouse would like discuss wife metoprolol (LOPRESSOR) 50 MG tablet. Please advise

## 2014-08-25 NOTE — Telephone Encounter (Signed)
Aldactone was stopped in hospital and she should remain off aldactone for now.  Ok to send metoprolol and dig refills.

## 2014-08-25 NOTE — Telephone Encounter (Signed)
Notified pt's spouse and he voices understanding. Advised him to discard the spironolactone. Refills sent.

## 2014-08-25 NOTE — Telephone Encounter (Signed)
Spoke with pt's husband. He requests metoprolol, digoxin and spironolactone rxs to [facility]. Spironolactone no longer on medication list; is pt supposed to be taking? Please advise if ok for [name] to send refills of metoprolol and digoxin?

## 2014-08-25 NOTE — Telephone Encounter (Signed)
Caller name: Andee Poles from [employer] to pt: Call back number: [phone] Pharmacy:  Reason for call:   Insurance only covers for 6 visits for PT, wants to get Lenna Sciara ok to extend visits

## 2014-08-25 NOTE — Telephone Encounter (Signed)
Signed/completed forms faxed back to [facility]. Sent for scanning. JG//CMA

## 2014-08-28 ENCOUNTER — Ambulatory Visit (INDEPENDENT_AMBULATORY_CARE_PROVIDER_SITE_OTHER): Payer: BLUE CROSS/BLUE SHIELD | Admitting: Pharmacist

## 2014-08-28 ENCOUNTER — Telehealth: Payer: Self-pay | Admitting: Family

## 2014-08-28 DIAGNOSIS — Z86718 Personal history of other venous thrombosis and embolism: Secondary | ICD-10-CM

## 2014-08-28 DIAGNOSIS — I4891 Unspecified atrial fibrillation: Secondary | ICD-10-CM

## 2014-08-28 DIAGNOSIS — C569 Malignant neoplasm of unspecified ovary: Secondary | ICD-10-CM

## 2014-08-28 LAB — POCT INR: INR: 1.5

## 2014-08-28 NOTE — Telephone Encounter (Signed)
Caller name: Casey Burkitt from [place]  Call back number: [phone]   Reason for call:  As per RN would like NP to touch base with pt at the time of appointment 08/29/14 regarding weakness.

## 2014-08-28 NOTE — Progress Notes (Signed)
*  Telephone Encounter - No Charge*  [facility] RN, Caryl Pina called in results. INR 1.5 today with goal of 2-3 Pt is doing well with no complaints No unsual bleeding or bruising No medication changes but pt has been eating more greens since INR was elevated Pt held coumadin x 2 doses last Wednesday and Thursday as instructed She began 3 mg daily on Friday INR likely low due to these factors Due to erratic INR changes of late will only slightly increase dose and follow up next week with [facility] Plan: INR below goal after holding x 2 days and dose decrease Slight dose increase to Coumadin 3 mg daily except for 4.5 mg (1.5 tablets) on Mondays and Fridays (this is still a 25% dose reduction from previous dose of 4.5 mg daily which resulted in elevated INR).  [facility] RN will draw PT/INR on Monday 4/25 and call results to Coumadin clinic.

## 2014-08-28 NOTE — Patient Instructions (Signed)
INR below goal after holding x 2 days and dose decrease Slight dose increase to Coumadin 3 mg daily except for 4.5 mg (1.5 tablets) on Mondays and Fridays.  [facility] RN will draw PT/INR on Monday 4/25 and call results to Coumadin clinic.

## 2014-08-28 NOTE — Telephone Encounter (Signed)
OK to extend visits please.

## 2014-08-28 NOTE — Telephone Encounter (Signed)
Left detailed message on voicemail and to call if any questions. 

## 2014-08-29 ENCOUNTER — Encounter: Payer: Self-pay | Admitting: Nurse Practitioner

## 2014-08-29 ENCOUNTER — Encounter: Payer: Self-pay | Admitting: Family

## 2014-08-29 ENCOUNTER — Ambulatory Visit (INDEPENDENT_AMBULATORY_CARE_PROVIDER_SITE_OTHER): Payer: BLUE CROSS/BLUE SHIELD | Admitting: Family

## 2014-08-29 ENCOUNTER — Ambulatory Visit (INDEPENDENT_AMBULATORY_CARE_PROVIDER_SITE_OTHER): Payer: BLUE CROSS/BLUE SHIELD | Admitting: Nurse Practitioner

## 2014-08-29 VITALS — BP 124/66 | HR 71 | Ht 65.0 in | Wt 160.8 lb

## 2014-08-29 VITALS — BP 124/60 | HR 74 | Temp 98.0°F | Resp 16 | Ht 64.0 in | Wt 159.0 lb

## 2014-08-29 DIAGNOSIS — I10 Essential (primary) hypertension: Secondary | ICD-10-CM | POA: Insufficient documentation

## 2014-08-29 DIAGNOSIS — R634 Abnormal weight loss: Secondary | ICD-10-CM

## 2014-08-29 DIAGNOSIS — I5042 Chronic combined systolic (congestive) and diastolic (congestive) heart failure: Secondary | ICD-10-CM | POA: Diagnosis not present

## 2014-08-29 DIAGNOSIS — I480 Paroxysmal atrial fibrillation: Secondary | ICD-10-CM

## 2014-08-29 MED ORDER — DOCUSATE SODIUM 100 MG PO CAPS: 100.0000 mg | ORAL_CAPSULE | Freq: Two times a day (BID) | ORAL | Status: DC | PRN

## 2014-08-29 NOTE — Progress Notes (Signed)
Pre visit review using our clinic review tool, if applicable. No additional management support is needed unless otherwise documented below in the visit note. 

## 2014-08-29 NOTE — Progress Notes (Signed)
Subjective:    Patient ID: [name], female    DOB: 06-Aug-1955, 59 y.o.   MRN: [id number]  HPI  [name] is a 59 yr old femael who presents today for follow up.  1) Weight loss- last visit she was placed on megace.  She has gained 2 pounds.  Reports that she has been drinking boost and taking megace.  Notes that her symptoms have improved.   Wt Readings from Last 3 Encounters:  08/29/14 159 lb (72.122 kg)  08/22/14 157 lb 12.8 oz (71.578 kg)  08/18/14 166 lb (75.297 kg)   2) Weakness- she is being followed by PT and by [facility] RN.     Review of Systems    see HPI  Past Medical History  Diagnosis Date  . Depression   . DVT (deep venous thrombosis)     hx of on HRT left leg ~2006  . GERD (gastroesophageal reflux disease)   . Hyperlipidemia   . Hypertension   . Hypothyroidism   . PPD positive, treated     rx inh   . Diabetes mellitus   . 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 DJD (degenerative joint disease) of lumbar spine   . Chronic diastolic CHF (congestive heart failure)     a. 08/2012 Echo: EF 55-60%, no rwma, mod MR.  [name] A-fib     a. on amio for rate control/coumadin.  [name] Physical deconditioning   . Allergy   . Anemia     low iron hx  . Blood transfusion without reported diagnosis     2 u prbc  . Cataract     removed ou  . OSA on CPAP     cpap 4.5 setting  . Breast cancer 2014    a. 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 Marital Status: Married    Spouse Name: N/A  . Number of Children: 2  . Years of Education: N/A   Occupational History  . [place] History Main Topics  . Smoking status: Former Smoker -- 1.00 packs/day for 1 years  . Smokeless tobacco: Never Used  . Alcohol Use: No  . Drug Use: No     Comment: quit age 58 only smoked as a teen 1 year  . Sexual  Activity: No   Other Topics Concern  . 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     Past Surgical History  Procedure Laterality Date  . Tubal ligation    . Cholecystectomy    . Foot surgery    . Eye surgery    . Cataract extraction    . Percutaneous liver biopsy    . Breast biopsy      left breast  . Mastectomy modified radical  06/08/2012    Procedure: MASTECTOMY MODIFIED RADICAL;  Surgeon: Edward Jolly, MD;  Location: [place];  Service: General;  Laterality: Right;  . Portacath placement  06/08/2012    Procedure: INSERTION PORT-A-CATH;  Surgeon: Edward Jolly, MD;  [facility];  Service: General;  Laterality: Left;  . Peg tube placement      peg removed 2015  . History of chemotherapy x 2 treatments, radiation tx  2014  . Pac removed  2015  . Laparotomy N/A 06/27/2014    Procedure: EXPLORATORY LAPAROTOMY;  Surgeon: Everitt Amber, MD;  [facility]: [facility];  Service: Gynecology;  Laterality: N/A;  . Abdominal hysterectomy N/A 06/27/2014    Procedure: HYSTERECTOMY ABDOMINAL TOTAL;  Surgeon: Everitt Amber, MD;  [facility];  Service: Gynecology;  Laterality: N/A;  . Salpingoophorectomy Bilateral 06/27/2014    Procedure: BILATERAL SALPINGO OOPHORECTOMY/TUMOR DEBULKING/LYMPHNODE DISSECTION, OMENTECTOMY;  Surgeon: Everitt Amber, MD;  [facility];  Service: Gynecology;  Laterality: Bilateral;    Family History  Problem Relation Age of Onset  . Diabetes Mother   . Hypertension Mother   . Arthritis Mother   . Heart disease Mother   . Heart failure Mother   . Other Mother     benign breast mass  . Stroke Father   . Heart disease Father   . Diabetes Paternal Grandmother   . Colon cancer Paternal Grandfather     Allergies  Allergen Reactions  . Olmesartan Medoxomil Cough    REACTION: ? if  cough  . Tetracycline Hcl     Unknown reaction, too long for patient to remember   . Venlafaxine     REACTION: severe dry moouth  . Adhesive [Tape] Rash    Current Outpatient Prescriptions on File Prior to Visit  Medication Sig Dispense Refill  . ALPRAZolam (XANAX XR) 1 MG 24 hr tablet Take 1 tablet (1 mg total) by mouth 2 (two) times daily. 60 tablet 1  . clonazePAM (KLONOPIN) 0.5 MG tablet TAKE 1/2 TABLET BY MOUTH TWICE DAILY AS NEEDED 30 tablet 0  . dexamethasone (DECADRON) 4 MG tablet Take 2 tablets daily with a meal.  START THE DAY AFTER CHEMO FOR 3 DAYS  1  . digoxin (LANOXIN) 0.25 MG tablet Take 1 tablet (0.25 mg total) by mouth daily. 90 tablet 1  . escitalopram (LEXAPRO) 20 MG tablet Take 20 mg by mouth daily.    . famotidine (PEPCID) 20 MG tablet Take 1 tablet (20 mg total) by mouth daily. (Patient taking differently: Take 20 mg by mouth daily as needed. ) 30 tablet 0  . furosemide (LASIX) 20 MG tablet Take 1 tablet (20 mg total) by mouth daily. 90 tablet 1  . letrozole (FEMARA) 2.5 MG tablet Take 1 tablet (2.5 mg total) by mouth daily. 90 tablet 0  . levothyroxine (SYNTHROID, LEVOTHROID) 150 MCG tablet Take 1 tablet (150 mcg total) by mouth daily. 90 tablet 1  . lidocaine-prilocaine (EMLA) cream Apply over port area 1-2 hours before chemotherapy 30 g 0  . loperamide (IMODIUM A-D) 2 MG tablet Take 1 tablet (2 mg total) by mouth 3 (three) times daily as needed for diarrhea or loose stools. 15 tablet 0  . megestrol (MEGACE) 400 MG/10ML suspension Take 10 mLs (400 mg total) by mouth daily. 300 mL 0  . metoprolol (LOPRESSOR) 50 MG tablet Take 1 tablet (50 mg total) by mouth 2 (two) times daily. 180 tablet 1  . Nutritional Supplements (GLUCERNA 1.0 CAL) LIQD Take 1 Bottle by mouth 3 (three) times daily. 90 Bottle 0  . OLANZapine (ZYPREXA) 2.5 MG tablet Take 2.5 mg by mouth at bedtime.    . ondansetron (ZOFRAN) 8 MG tablet Take 1 tablet (8 mg total) by mouth every 8 (eight) hours as  needed for nausea or vomiting.    . potassium chloride (K-DUR) 10 MEQ tablet Take 10 mEq by mouth 2 (two) times daily.  3  . saccharomyces boulardii (FLORASTOR) 250 MG capsule Take 1 capsule (250 mg total) by mouth 2 (two) times daily. 30 capsule 0  . warfarin (COUMADIN) 3 MG tablet Take 1 tablet (3 mg total) by mouth daily at 6 PM. Or as directed by coumadin clinic.     Current Facility-Administered Medications on File Prior to Visit  Medication Dose Route Frequency Provider Last Rate Last Dose  . sodium chloride 0.9 % injection 10 mL  10 mL Intracatheter PRN Chauncey Cruel, MD   10 mL at 07/24/14 1523    BP 124/60 mmHg  Pulse 74  Temp(Src) 98 F (36.7 C) (Oral)  Resp 16  Ht 5\' 4"  (1.626 m)  Wt 159 lb (72.122 kg)  BMI 27.28 kg/m2  SpO2 99%    Objective:   Physical Exam  Constitutional: She is oriented to person, place, and time. She appears well-developed and well-nourished.  Cardiovascular: Normal rate, regular rhythm and normal heart sounds.   No murmur heard. Pulmonary/Chest: Effort normal and breath sounds normal. No respiratory distress. She has no wheezes.  Neurological: She is alert and oriented to person, place, and time.  Skin:  Fine hair growth noted on scalp  Psychiatric: She has a normal mood and affect. Her behavior is normal. Judgment and thought content normal.          Assessment & Plan:

## 2014-08-29 NOTE — Patient Instructions (Addendum)
Add colace twice daily as needed for constipation (stool softner).   Please follow up in 6 weeks.

## 2014-08-29 NOTE — Progress Notes (Signed)
Patient Name: [name] Date of Encounter: 08/29/2014  Primary Care Provider:  [name], NP Primary Cardiologist:  Joaquim Nam, MD   Chief Complaint  59 year old female with a history of paroxysmal atrial fibrillation, breast, and more recently ovarian cancer who presents for cardiology follow-up.  Past Medical History   Past Medical History  Diagnosis Date  . Depression   . DVT (deep venous thrombosis)     hx of on HRT left leg ~2006  . GERD (gastroesophageal reflux disease)   . Hyperlipidemia   . Hypertension   . Hypothyroidism   . PPD positive, treated     rx inh   . Diabetes mellitus   . 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 DJD (degenerative joint disease) of lumbar spine   . Chronic combined systolic and diastolic CHF (congestive heart failure)     a. 08/2012 Echo: EF 55-60%, no rwma, mod MR;  b. 07/2014 Echo: EF 45-50%, distal antsept HK, mod TR/MR, mildly bil-atrial enlargement.  . Paroxysmal atrial fibrillation     a. CHA2DS2VASc = 4-->coumadin.  [name] Physical deconditioning   . Allergy   . Anemia     low iron hx  . Cataract     removed ou  . OSA on CPAP     cpap 4.5 setting  . Breast cancer 2014    a. 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 Clear Cell Ovarian Cancer     a. 06/2014 s/p TAH/BSO debulking/lymph node dissection;  b. Now on chemo.   Past Surgical History  Procedure Laterality Date  . Tubal ligation    . Cholecystectomy    . Foot surgery    . Eye surgery    . Cataract extraction    . Percutaneous liver biopsy    . Breast biopsy      left breast  . Mastectomy modified radical  06/08/2012    Procedure: MASTECTOMY MODIFIED RADICAL;  Surgeon: Edward Jolly, MD;  Location: [place];  Service: General;  Laterality: Right;  . Portacath placement  06/08/2012    Procedure: INSERTION PORT-A-CATH;  Surgeon:  Edward Jolly, MD;  Location: [facility];  Service: General;  Laterality: Left;  . Peg tube placement      peg removed 2015  . History of chemotherapy x 2 treatments, radiation tx  2014  . Pac removed  2015  . Laparotomy N/A 06/27/2014    Procedure: EXPLORATORY LAPAROTOMY;  Surgeon: Everitt Amber, MD;  [facility];  Service: Gynecology;  Laterality: N/A;  . Abdominal hysterectomy N/A 06/27/2014    Procedure: HYSTERECTOMY ABDOMINAL TOTAL;  Surgeon: Everitt Amber, MD;  [facility];  Service: Gynecology;  Laterality: N/A;  . Salpingoophorectomy Bilateral 06/27/2014    Procedure: BILATERAL SALPINGO OOPHORECTOMY/TUMOR DEBULKING/LYMPHNODE DISSECTION, OMENTECTOMY;  Surgeon: Everitt Amber, MD;  [facility];  Service: Gynecology;  Laterality: Bilateral;    Allergies  Allergies  Allergen Reactions  . Olmesartan Medoxomil Cough    REACTION: ? if cough  . Venlafaxine Other (See Comments)    REACTION: severe dry mouth  . Adhesive [Tape] Rash  . Tetracycline Hcl Other (See Comments)    Unknown reaction, too long for patient to remember     HPI  59 year old female with the above complex problem list. She has a history of breast cancer dating back to 2014 and is status  post right mastectomy and chemotherapy. She also has a history of paroxysmal atrial fibrillation dating back to the same time for which, she is on chronic Coumadin anticoagulation. She was previously on amiodarone but this was discontinued at some point. Unfortunately, she has more recently been diagnosed with a pelvic mass which was subsequently found to be clear cell ovarian cancer. In February, she underwent total abdominal hysterectomy with BSO, debulking, and lymph node dissection. In mid March, she was admitted to the hospital with febrile neutropenia and septic shock secondary to Klebsiella bacteremia. In that setting, she had A. fib with RVR. She had a complex hospital course with prolonged antibiotics. Our team did see her in  the hospital and after digoxin loading and titration of beta blocker therapy, rate control was achieved. She did have some volume overload on exam that was treated with IV Lasix. She was discharged home March 29 and was seen back in primary care clinic on April 8. She was again found to be in A. fib with RVR in the setting of dehydration. She was treated with 2 L of IV fluid and labs were checked and found to be fairly normal. Digoxin level is 1.0. She is discharged from the ED following hydration with heart rates in the low 100s. Since then, she reports ongoing weakness and poor appetite. She does have physical therapy coming out of the house several days per week but she is otherwise interactive in their absence. Her daughter has noted that she is fairly unsteady on her feet and has also been experiencing intermittent confusion.  She has not been having any palpitations, chest pain, dyspnea, PND, orthopnea, dizziness, syncope, or early satiety. She does admit to occasional lower extremity edema and spends much of the day sitting. She remains on Coumadin anticoagulation and has her INRs checked via home health.  Home Medications  Prior to Admission medications   Medication Sig Start Date End Date Taking? Authorizing Provider  ALPRAZolam (XANAX XR) 1 MG 24 hr tablet Take 1 tablet (1 mg total) by mouth 2 (two) times daily. 07/05/14  Yes Chauncey Cruel, MD  clonazePAM (KLONOPIN) 0.5 MG tablet TAKE 1/2 TABLET BY MOUTH TWICE DAILY AS NEEDED 08/14/14  Yes Debbrah Alar, NP  dexamethasone (DECADRON) 4 MG tablet Take 2 tablets daily with a meal.  START THE DAY AFTER CHEMO FOR 3 DAYS 07/14/14  Yes Historical Provider, MD  digoxin (LANOXIN) 0.25 MG tablet Take 1 tablet (0.25 mg total) by mouth daily. 08/25/14  Yes Debbrah Alar, NP  docusate sodium (COLACE) 100 MG capsule Take 1 capsule (100 mg total) by mouth 2 (two) times daily as needed for mild constipation. 08/29/14  Yes Debbrah Alar, NP    escitalopram (LEXAPRO) 20 MG tablet Take 20 mg by mouth daily.   Yes Historical Provider, MD  famotidine (PEPCID) 20 MG tablet Take 1 tablet (20 mg total) by mouth daily. Patient taking differently: Take 20 mg by mouth daily as needed.  08/08/14  Yes Modena Jansky, MD  furosemide (LASIX) 20 MG tablet Take 1 tablet (20 mg total) by mouth daily. 05/23/14  Yes Debbrah Alar, NP  letrozole (FEMARA) 2.5 MG tablet Take 1 tablet (2.5 mg total) by mouth daily. 05/22/14  Yes Chauncey Cruel, MD  levothyroxine (SYNTHROID, LEVOTHROID) 150 MCG tablet Take 1 tablet (150 mcg total) by mouth daily. 05/23/14  Yes Debbrah Alar, NP  lidocaine-prilocaine (EMLA) cream Apply over port area 1-2 hours before chemotherapy 07/14/14  Yes Virgie Dad Magrinat,  MD  loperamide (IMODIUM A-D) 2 MG tablet Take 1 tablet (2 mg total) by mouth 3 (three) times daily as needed for diarrhea or loose stools. 08/08/14  Yes Modena Jansky, MD  megestrol (MEGACE) 400 MG/10ML suspension Take 10 mLs (400 mg total) by mouth daily. 08/22/14  Yes Debbrah Alar, NP  metoprolol (LOPRESSOR) 50 MG tablet Take 1 tablet (50 mg total) by mouth 2 (two) times daily. 08/25/14  Yes Debbrah Alar, NP  Nutritional Supplements (GLUCERNA 1.0 CAL) LIQD Take 1 Bottle by mouth 3 (three) times daily. 08/22/14  Yes Debbrah Alar, NP  OLANZapine (ZYPREXA) 2.5 MG tablet Take 2.5 mg by mouth at bedtime.   Yes Historical Provider, MD  ondansetron (ZOFRAN) 8 MG tablet Take 1 tablet (8 mg total) by mouth every 8 (eight) hours as needed for nausea or vomiting. 08/08/14  Yes Modena Jansky, MD  potassium chloride (K-DUR) 10 MEQ tablet Take 10 mEq by mouth 2 (two) times daily. 04/18/14  Yes Historical Provider, MD  saccharomyces boulardii (FLORASTOR) 250 MG capsule Take 1 capsule (250 mg total) by mouth 2 (two) times daily. 08/08/14  Yes Modena Jansky, MD  warfarin (COUMADIN) 3 MG tablet Take 1 tablet (3 mg total) by mouth daily at 6 PM. Or as directed  by coumadin clinic. 08/08/14  Yes Modena Jansky, MD    Review of Systems  In the setting of ovarian cancer, chemotherapy, hospitalization in March for bacteremia, she has been feeling weak and has a poor appetite. She denies chest pain, palpitations, dyspnea, pnd, orthopnea, n, v, dizziness, syncope, edema, weight gain.  All other systems reviewed and are otherwise negative except as noted above.  Physical Exam  VS:  BP 124/66 mmHg  Pulse 71  Ht 5\' 5"  (1.651 m)  Wt 160 lb 12.8 oz (72.938 kg)  BMI 26.76 kg/m2 , BMI Body mass index is 26.76 kg/(m^2). GEN: Pleasant female in no acute distress. HEENT: normal. Neck: Supple, no JVD, carotid bruits, or masses. Cardiac: RRR, no murmurs, rubs, or gallops. No clubbing, cyanosis. Trace right lower extremity edema.  Radials/DP/PT 2+ and equal bilaterally.  Respiratory:  Respirations regular and unlabored, clear to auscultation bilaterally. GI: Soft, nontender, nondistended, BS + x 4. MS: no deformity or atrophy. Skin: warm and dry, no rash. Neuro:  Strength and sensation are intact. Psych: Normal affect.  Assessment & Plan  1.  Paroxysmal atrial fibrillation: Patient is regular in rate-controlled on exam today. She remains on beta blocker and digoxin therapy and is anticoagulated with Coumadin. Her most recent INR was subtherapeutic but prior to that she was therapeutic to supratherapeutic. INR is currently being checked by home health and managed by the cancer center. Her daughter is concerned that she has been having some increase in confusion since March. She is concerned that digoxin may be playing a role. I evaluated labs drawn on April 8 and noted normal CBC, bmet, and also digoxin level.  I advised that although her digoxin level was nl, given temporal relationship between confusion and initiation of digoxin, she could try a 5 day digoxin holiday and see if she has less confusion.  Patient and dtr are interested in trying this.  If confusion  improves off of digoxin, I asked that the pts dtr call us to inform us, so that we may then titrate bb therapy.  2.  Chronic combined systolic/diastolic CHF:  Volume appears stable. She has been dehydrated as of late in the setting of chemo and anorexia.  Labs looked good on 4/8.  No changes today.  3.  Ovarian CA:  Per onc.  4.  HTN: stable.  5.  Dispo:  F/U with Dr. Acie Fredrickson in 2-3 mos or sooner if necessary.  Murray Hodgkins, NP 08/29/2014, 1:45 PM

## 2014-08-29 NOTE — Patient Instructions (Signed)
Medication Instructions:  Your physician recommends that you continue on your current medications as directed. Please refer to the Current Medication list given to you today.  Labwork: NONE  Testing/Procedures: NONE  Follow-Up: Your physician wants you to follow-up in: 3 months with Dr. Cathie Olden. You will receive a reminder letter in the mail two months in advance. If you don't receive a letter, please call our office to schedule the follow-up appointment.  Any Other Special Instructions Will Be Listed Below (If Applicable).

## 2014-08-29 NOTE — Assessment & Plan Note (Addendum)
Improved, continue boost, megace.  Monitor.  She is still weak but this is improving with PT. Continue PT.

## 2014-08-30 ENCOUNTER — Encounter: Payer: Self-pay | Admitting: Skilled Nursing Facility1

## 2014-08-30 ENCOUNTER — Other Ambulatory Visit: Payer: Self-pay

## 2014-08-30 NOTE — Progress Notes (Signed)
Subjective:     Patient ID: [name], female   DOB: Aug 09, 1955, 59 y.o.   MRN: [id number]  HPI   Review of Systems     Objective:   Physical Exam To identify some dietary strategies to gain to wt that has been lost back.    Assessment:     Pt was contacted via the telephone [phone]). Pt states she does not know how much wt she has lost but she did describe the wt loss as causing her clothes to fit a little better not falling off. Pt is currently drinking 3 Glucerna a day. Pt states she has no appetite but states she is eating because she knows she has to. Pt states she wants to go walking but her husband will not allow her to leave the house, pt also states he holds her clothes so she cannot leave the house.     Plan:     Dietitian discussed keeping the foods she still enjoys like corn available in the house, preparing cream based soups, and making her Glucerna into a smoothie or milkshake. Dietitian also discussed the importance of physical activity and to tell her husband the health professional said it would be beneficial for her to leave the house. Dietitian will leave her information with social work to ensure the pts safety.  Nutrition information will be sent to her address.

## 2014-08-31 ENCOUNTER — Encounter: Payer: Self-pay | Admitting: *Deleted

## 2014-08-31 NOTE — Progress Notes (Signed)
[facility] Work  Clinical Social Work was referred by dietician for assessment of psychosocial needs as husband "was not letting her leave the house".  Clinical Social Worker contacted patient at home to offer support and assess for needs.  Pt reports she is doing well and trying to work with PT through [facility] to get stronger. Per pt, husband is looking out for her as she still needs supervision while regaining strength. She is wanting to get out and walk more, but stated she could not go alone for safety reasons as she could be at risk for falling. There does not appear to be any threat at home. Pt denied other concerns and agrees to contact [facility] team as needed.   Clinical Social Work interventions: Supportive listening  Loren Racer, [place] Worker [place]  [place] Phone: [phone] Fax: [phone]

## 2014-09-01 ENCOUNTER — Telehealth: Payer: Self-pay | Admitting: *Deleted

## 2014-09-01 NOTE — Telephone Encounter (Signed)
TC from pt's husband stating that pt is constipated and having only small 'bits' of stool that are hard. Has been taking colace 100 mg daily w/o relief. Instructed to increase fluids and to get OTC Senna S this am and start with 2 tablets now. If no results today then take 2 this evening and again in the am. If no results then call back to this clinic. Husband voiced understanding

## 2014-09-04 ENCOUNTER — Emergency Department (HOSPITAL_COMMUNITY)
Admission: EM | Admit: 2014-09-04 | Discharge: 2014-09-05 | Disposition: A | Discharge status: Home / Self Care | Payer: BLUE CROSS/BLUE SHIELD | Attending: Emergency Medicine | Admitting: Emergency Medicine

## 2014-09-04 ENCOUNTER — Encounter (HOSPITAL_COMMUNITY): Payer: Self-pay | Admitting: Emergency Medicine

## 2014-09-04 ENCOUNTER — Ambulatory Visit (INDEPENDENT_AMBULATORY_CARE_PROVIDER_SITE_OTHER): Payer: BLUE CROSS/BLUE SHIELD | Admitting: Pharmacist

## 2014-09-04 ENCOUNTER — Telehealth: Payer: Self-pay | Admitting: Family

## 2014-09-04 DIAGNOSIS — Z9849 Cataract extraction status, unspecified eye: Secondary | ICD-10-CM | POA: Diagnosis not present

## 2014-09-04 DIAGNOSIS — E039 Hypothyroidism, unspecified: Secondary | ICD-10-CM | POA: Diagnosis not present

## 2014-09-04 DIAGNOSIS — C569 Malignant neoplasm of unspecified ovary: Secondary | ICD-10-CM | POA: Insufficient documentation

## 2014-09-04 DIAGNOSIS — Z853 Personal history of malignant neoplasm of breast: Secondary | ICD-10-CM | POA: Diagnosis not present

## 2014-09-04 DIAGNOSIS — F329 Major depressive disorder, single episode, unspecified: Secondary | ICD-10-CM | POA: Insufficient documentation

## 2014-09-04 DIAGNOSIS — Z7901 Long term (current) use of anticoagulants: Secondary | ICD-10-CM | POA: Insufficient documentation

## 2014-09-04 DIAGNOSIS — Z86718 Personal history of other venous thrombosis and embolism: Secondary | ICD-10-CM | POA: Insufficient documentation

## 2014-09-04 DIAGNOSIS — I4891 Unspecified atrial fibrillation: Secondary | ICD-10-CM

## 2014-09-04 DIAGNOSIS — Z90710 Acquired absence of both cervix and uterus: Secondary | ICD-10-CM | POA: Insufficient documentation

## 2014-09-04 DIAGNOSIS — G4733 Obstructive sleep apnea (adult) (pediatric): Secondary | ICD-10-CM | POA: Insufficient documentation

## 2014-09-04 DIAGNOSIS — I480 Paroxysmal atrial fibrillation: Secondary | ICD-10-CM | POA: Insufficient documentation

## 2014-09-04 DIAGNOSIS — E119 Type 2 diabetes mellitus without complications: Secondary | ICD-10-CM | POA: Diagnosis not present

## 2014-09-04 DIAGNOSIS — Z9851 Tubal ligation status: Secondary | ICD-10-CM | POA: Insufficient documentation

## 2014-09-04 DIAGNOSIS — K5900 Constipation, unspecified: Secondary | ICD-10-CM | POA: Insufficient documentation

## 2014-09-04 DIAGNOSIS — I5042 Chronic combined systolic (congestive) and diastolic (congestive) heart failure: Secondary | ICD-10-CM | POA: Diagnosis not present

## 2014-09-04 DIAGNOSIS — Z79899 Other long term (current) drug therapy: Secondary | ICD-10-CM | POA: Insufficient documentation

## 2014-09-04 DIAGNOSIS — Z9049 Acquired absence of other specified parts of digestive tract: Secondary | ICD-10-CM | POA: Insufficient documentation

## 2014-09-04 DIAGNOSIS — Z87891 Personal history of nicotine dependence: Secondary | ICD-10-CM | POA: Insufficient documentation

## 2014-09-04 DIAGNOSIS — Z862 Personal history of diseases of the blood and blood-forming organs and certain disorders involving the immune mechanism: Secondary | ICD-10-CM | POA: Diagnosis not present

## 2014-09-04 DIAGNOSIS — Z7952 Long term (current) use of systemic steroids: Secondary | ICD-10-CM | POA: Diagnosis not present

## 2014-09-04 DIAGNOSIS — Z9981 Dependence on supplemental oxygen: Secondary | ICD-10-CM | POA: Diagnosis not present

## 2014-09-04 DIAGNOSIS — K219 Gastro-esophageal reflux disease without esophagitis: Secondary | ICD-10-CM | POA: Insufficient documentation

## 2014-09-04 DIAGNOSIS — I10 Essential (primary) hypertension: Secondary | ICD-10-CM | POA: Insufficient documentation

## 2014-09-04 LAB — URINE MICROSCOPIC-ADD ON

## 2014-09-04 LAB — URINALYSIS, ROUTINE W REFLEX MICROSCOPIC
Bilirubin Urine: NEGATIVE
Glucose, UA: NEGATIVE mg/dL
KETONES UR: NEGATIVE mg/dL
Nitrite: POSITIVE — AB
Protein, ur: 30 mg/dL — AB
SPECIFIC GRAVITY, URINE: 1.022 (ref 1.005–1.030)
UROBILINOGEN UA: 1 mg/dL (ref 0.0–1.0)
pH: 7 (ref 5.0–8.0)

## 2014-09-04 LAB — POCT INR: INR: 1.8

## 2014-09-04 MED ORDER — POLYETHYLENE GLYCOL 3350 17 G PO PACK
17.0000 g | PACK | Freq: Once | ORAL | Status: AC
  Administered 2014-09-04: 17 g via ORAL
  Filled 2014-09-04: qty 1

## 2014-09-04 MED ORDER — AMOXICILLIN-POT CLAVULANATE 875-125 MG PO TABS: 1.0000 | ORAL_TABLET | Freq: Two times a day (BID) | ORAL | Status: DC

## 2014-09-04 MED ORDER — POLYETHYLENE GLYCOL 3350 17 G PO PACK: 17.0000 g | PACK | Freq: Every day | ORAL | Status: DC

## 2014-09-04 NOTE — ED Provider Notes (Addendum)
CSN: [id number]     Arrival date & time 09/04/14  1944 History   First MD Initiated Contact with Patient 09/04/14 2226     Chief Complaint  Patient presents with  . Constipation     (Consider location/radiation/quality/duration/timing/severity/associated sxs/prior Treatment) HPI The patient states she's had decreased bowel movements. She had only be held to pass very small amounts of hard stool. She was having some abdominal cramping yesterday and some aching today. She tried to have a bottle of mag citrate yesterday evening without any results and drink the second half this morning again without any results. She denies having any abdominal pain at this time. She does however state she feels that she could now maybe have a bowel movement. She denies a pain burning or urgency urination. Patient denies she's had any fever. There's been no vomiting. She denies a change in appetite. She reports she has not eaten much today but that is typical for her after she's had chemotherapy. She denies recurrent problems constipation. She states about 2 days ago she tried some Colace 2 doses without relief and then today the mag citrate. Past Medical History  Diagnosis Date  . Depression   . DVT (deep venous thrombosis)     hx of on HRT left leg ~2006  . GERD (gastroesophageal reflux disease)   . Hyperlipidemia   . Hypertension   . Hypothyroidism   . PPD positive, treated     rx inh   . Diabetes mellitus   . 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 DJD (degenerative joint disease) of lumbar spine   . Chronic combined systolic and diastolic CHF (congestive heart failure)     a. 08/2012 Echo: EF 55-60%, no rwma, mod MR;  b. 07/2014 Echo: EF 45-50%, distal antsept HK, mod TR/MR, mildly bil-atrial enlargement.  . Paroxysmal atrial fibrillation     a. CHA2DS2VASc = 4-->coumadin.  [name] Physical deconditioning   . Allergy   . Anemia     low iron hx  . Cataract     removed ou  . OSA on CPAP      cpap 4.5 setting  . Breast cancer 2014    a. 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 Clear Cell Ovarian Cancer     a. 06/2014 s/p TAH/BSO debulking/lymph node dissection;  b. Now on chemo.   Past Surgical History  Procedure Laterality Date  . Tubal ligation    . Cholecystectomy    . Foot surgery    . Eye surgery    . Cataract extraction    . Percutaneous liver biopsy    . Breast biopsy      left breast  . Mastectomy modified radical  06/08/2012    Procedure: MASTECTOMY MODIFIED RADICAL;  Surgeon: Edward Jolly, MD;  Location: [facility];  Service: General;  Laterality: Right;  . Portacath placement  06/08/2012    Procedure: INSERTION PORT-A-CATH;  Surgeon: Edward Jolly, MD;  Location: [facility];  Service: General;  Laterality: Left;  . Peg tube placement      peg removed 2015  . History of chemotherapy x 2 treatments, radiation tx  2014  . Pac removed  2015  . Laparotomy N/A 06/27/2014    Procedure: EXPLORATORY LAPAROTOMY;  Surgeon: Everitt Amber, MD;  Location: [facility];  Service: Gynecology;  Laterality: N/A;  . Abdominal hysterectomy  N/A 06/27/2014    Procedure: HYSTERECTOMY ABDOMINAL TOTAL;  Surgeon: Everitt Amber, MD;  Location: [facility];  Service: Gynecology;  Laterality: N/A;  . Salpingoophorectomy Bilateral 06/27/2014    Procedure: BILATERAL SALPINGO OOPHORECTOMY/TUMOR DEBULKING/LYMPHNODE DISSECTION, OMENTECTOMY;  Surgeon: Everitt Amber, MD;  Location: [facility];  Service: Gynecology;  Laterality: Bilateral;   Family History  Problem Relation Age of Onset  . Diabetes Mother   . Hypertension Mother   . Arthritis Mother   . Heart disease Mother   . Heart failure Mother   . Other Mother     benign breast mass  . Stroke Father   . Heart disease Father   . Diabetes Paternal Grandmother   . Colon cancer Paternal Grandfather    History  Substance Use Topics  . Smoking  status: Former Smoker -- 1.00 packs/day for 1 years  . Smokeless tobacco: Never Used  . Alcohol Use: No   OB History    Gravida Para Term Preterm AB TAB SAB Ectopic Multiple Living   2 2             Review of Systems  10 Systems reviewed and are negative for acute change except as noted in the HPI.   Allergies  Olmesartan medoxomil; Venlafaxine; Adhesive; and Tetracycline hcl  Home Medications   Prior to Admission medications   Medication Sig Start Date End Date Taking? Authorizing Provider  ALPRAZolam (XANAX XR) 1 MG 24 hr tablet Take 1 tablet (1 mg total) by mouth 2 (two) times daily. 07/05/14  Yes Chauncey Cruel, MD  dexamethasone (DECADRON) 4 MG tablet Take 2 tablets daily with a meal.  START THE DAY AFTER CHEMO FOR 3 DAYS 07/14/14  Yes Historical Provider, MD  digoxin (LANOXIN) 0.25 MG tablet Take 1 tablet (0.25 mg total) by mouth daily. 08/25/14  Yes Debbrah Alar, NP  escitalopram (LEXAPRO) 20 MG tablet Take 20 mg by mouth daily.   Yes Historical Provider, MD  furosemide (LASIX) 20 MG tablet Take 1 tablet (20 mg total) by mouth daily. 05/23/14  Yes Debbrah Alar, NP  letrozole (FEMARA) 2.5 MG tablet Take 1 tablet (2.5 mg total) by mouth daily. 05/22/14  Yes Chauncey Cruel, MD  levothyroxine (SYNTHROID, LEVOTHROID) 150 MCG tablet Take 1 tablet (150 mcg total) by mouth daily. 05/23/14  Yes Debbrah Alar, NP  megestrol (MEGACE) 400 MG/10ML suspension Take 10 mLs (400 mg total) by mouth daily. 08/22/14  Yes Debbrah Alar, NP  metoprolol (LOPRESSOR) 50 MG tablet Take 1 tablet (50 mg total) by mouth 2 (two) times daily. 08/25/14  Yes Debbrah Alar, NP  Nutritional Supplements (GLUCERNA 1.0 CAL) LIQD Take 1 Bottle by mouth 3 (three) times daily. 08/22/14  Yes Debbrah Alar, NP  OLANZapine (ZYPREXA) 2.5 MG tablet Take 2.5 mg by mouth at bedtime.   Yes Historical Provider, MD  potassium chloride (K-DUR) 10 MEQ tablet Take 10 mEq by mouth 2 (two) times daily.  04/18/14  Yes Historical Provider, MD  saccharomyces boulardii (FLORASTOR) 250 MG capsule Take 1 capsule (250 mg total) by mouth 2 (two) times daily. 08/08/14  Yes Modena Jansky, MD  warfarin (COUMADIN) 3 MG tablet Take 1 tablet (3 mg total) by mouth daily at 6 PM. Or as directed by coumadin clinic. Patient taking differently: Take 3-4.5 mg by mouth daily at 6 PM. Take 1 tablet (3 mg) on Sun, Tues, Thurs, Sat and Take 1.5 tablets (4.5 mg) on Mon, Wed, Fri. 08/08/14  Yes Modena Jansky, MD  clonazePAM (  KLONOPIN) 0.5 MG tablet TAKE 1/2 TABLET BY MOUTH TWICE DAILY AS NEEDED 08/14/14   Debbrah Alar, NP  docusate sodium (COLACE) 100 MG capsule Take 1 capsule (100 mg total) by mouth 2 (two) times daily as needed for mild constipation. 08/29/14   Debbrah Alar, NP  famotidine (PEPCID) 20 MG tablet Take 1 tablet (20 mg total) by mouth daily. Patient taking differently: Take 20 mg by mouth daily as needed.  08/08/14   Modena Jansky, MD  lidocaine-prilocaine (EMLA) cream Apply over port area 1-2 hours before chemotherapy 07/14/14   Chauncey Cruel, MD  loperamide (IMODIUM A-D) 2 MG tablet Take 1 tablet (2 mg total) by mouth 3 (three) times daily as needed for diarrhea or loose stools. 08/08/14   Modena Jansky, MD  ondansetron (ZOFRAN) 8 MG tablet Take 1 tablet (8 mg total) by mouth every 8 (eight) hours as needed for nausea or vomiting. 08/08/14   Modena Jansky, MD  polyethylene glycol (MIRALAX / GLYCOLAX) packet Take 17 g by mouth daily as needed for moderate constipation.    Historical Provider, MD  polyethylene glycol (MIRALAX / GLYCOLAX) packet Take 17 g by mouth daily. 09/04/14   Charlesetta Shanks, MD   BP 134/67 mmHg  Pulse 85  Temp(Src) 97.9 F (36.6 C) (Oral)  Resp 18  Ht 5\' 5"  (1.651 m)  Wt 159 lb (72.122 kg)  BMI 26.46 kg/m2  SpO2 100% Physical Exam  Constitutional: She is oriented to person, place, and time.  The patient is nontoxic and alert. She does not have acute respiratory  distress. The patient does have complete hair loss consistent with chemotherapy.  HENT:  Head: Normocephalic and atraumatic.  Nose: Nose normal.  Mouth/Throat: Oropharynx is clear and moist.  Eyes: EOM are normal. Pupils are equal, round, and reactive to light.  Neck: Neck supple.  Cardiovascular: Normal rate, regular rhythm, normal heart sounds and intact distal pulses.   Pulmonary/Chest: Effort normal and breath sounds normal.  Abdominal: Soft. Bowel sounds are normal. She exhibits no distension. There is no tenderness.  Genitourinary:  The patient does have firm stool in the rectal vault. This is consistent with impaction. The stool is brown in appearance. There is no blood or melena present.  Musculoskeletal: Normal range of motion. She exhibits edema.  Bilateral lower extremities have 1+ edema. No calf tenderness.  Neurological: She is alert and oriented to person, place, and time. She has normal strength. Coordination normal. GCS eye subscore is 4. GCS verbal subscore is 5. GCS motor subscore is 6.  Skin: Skin is warm, dry and intact.  Psychiatric: She has a normal mood and affect.   rectum was manually disimpacted by myself. The patient did not have any bleeding.  ED Course  Procedures (including critical care time) Labs Review Labs Reviewed  URINALYSIS, ROUTINE W REFLEX MICROSCOPIC    Imaging Review No results found.   EKG Interpretation None      MDM   Final diagnoses:  Constipation, unspecified constipation type  Ovarian cancer, unspecified laterality   the patient did not have abdominal pain at the time of my evaluation. She did however report decreased stool output and having to sit up toilet and straining at stool. With digital examination was firm stool in the vault. This was disimpacted and at this point time I do feel patient can transition to [facility] lax for constipation. She is nontoxic without fever or other complaints. The patient experienced earlier had been  cramping and colicky  in nature suggestive of constipation. The patient is counseled she must return if there should be any other concerning symptoms develop. She will try Mira lax for constipation and follow-up with her family physician this week. The patient's urine did test positive for UTI. She did not have any associated symptoms. The patient was given Augmentin based on her last positive micro-culture being pansensitive Klebsiella.    Charlesetta Shanks, MD 09/04/14 [id number]  Charlesetta Shanks, MD 09/05/14 Dyann Kief

## 2014-09-04 NOTE — Progress Notes (Signed)
INR = 1.8 on Coumadin 3 mg daily except 4.5 mg on Mon/Fri INR drawn at pts home today by [facility] Caryl Pina, RN called in results. No complaints re: anticoag.  No missed doses of Coumadin Has constipation.  Last BM was last Sun/Mon.  Pt took Mag Citrate last night.  No results yet.  I advised adding Miralax 17 g/day PRN Appetite "improving." INR slightly below goal.  Increase Coumadin to 3 mg daily except 4.5 mg MWF. Caryl Pina will repeat INR next Mon 09/11/14 at pts home & call w/ results. NO CHARGE - phone encounter. Kennith Center, Pharm.D., CPP 09/04/2014@10 :04 AM

## 2014-09-04 NOTE — Telephone Encounter (Signed)
Caller name: [facility] P[facility] from [facility] Relation to pt: Call back number: [phone] Pharmacy:  Reason for call:   Need official orders to see patient for skilled nursing related to Afib, CHS, and ovarian cancer. Requesting to see patient once a week for two weeks and then one time every other week for three weeks

## 2014-09-04 NOTE — ED Notes (Signed)
Pt states she is having constipation  States had a bowel movement today but the last good one was 2 to 3 days ago  Family states pt drank half a bottle of mag citrate last night and the other half today but has not had any results from it

## 2014-09-04 NOTE — Discharge Instructions (Signed)
Fecal Impaction A fecal impaction happens when there is a large, firm amount of stool (or feces) that cannot be passed. The impacted stool is usually in the rectum, which is the lowest part of the large bowel. The impacted stool can block the colon and cause significant problems. CAUSES  The longer stool stays in the rectum, the harder it gets. Anything that slows down your bowel movements can lead to fecal impaction, such as:  Constipation. This can be a long-standing (chronic) problem or can happen suddenly (acute).  Painful conditions of the rectum, such as hemorrhoids or anal fissures. The pain of these conditions can make you try to avoid having bowel movements.  Narcotic pain-relieving medicines, such as methadone, morphine, or codeine.  Not drinking enough fluids.  Inactivity and bed rest over long periods of time.  Diseases of the brain or nervous system that damage the nerves controlling the muscles of the intestines. SIGNS AND SYMPTOMS   Lack of normal bowel movements or changes in bowel patterns.  Sense of fullness in the rectum but unable to pass stool.  Pain or cramps in the abdominal area (often after meals).  Thin, watery discharge from the rectum. DIAGNOSIS  Your health care provider may suspect that you have a fecal impaction based on your symptoms and a physical exam. This will include an exam of your rectum. Sometimes X-rays or lab testing may be needed to confirm the diagnosis and to be sure there are no other problems.  TREATMENT   Initially an impaction can be removed manually. Using a gloved finger, your health care provider can remove hard stool from your rectum.  Medicine is sometimes needed. A suppository or enema can be given in the rectum to soften the stool, which can stimulate a bowel movement. Medicines can also be given by mouth (orally).  Though rare, surgery may be needed if the colon has torn (perforated) due to blockage. HOME CARE INSTRUCTIONS    Develop regular bowel habits. This could include getting in the habit of having a bowel movement after your morning cup of coffee or after eating. Be sure to allow yourself enough time on the toilet.  Maintain a high-fiber diet.  Drink enough fluids to keep your urine clear or pale yellow as directed by your health care provider.  Exercise regularly.  If you begin to get constipated, increase the amount of fiber in your diet. Eat plenty of fruits, vegetables, whole wheat breads, bran, oatmeal, and similar products.  Take natural fiber laxatives or other laxatives only as directed by your health care provider. SEEK MEDICAL CARE IF:   You have ongoing rectal pain.  You require enemas or suppositories more than twice a week.  You have rectal bleeding.  You have continued problems, or you develop abdominal pain.  You have thin, pencil-like stools. SEEK IMMEDIATE MEDICAL CARE IF:  You have black or tarry stools. MAKE SURE YOU:   Understand these instructions.  Will watch your condition.  Will get help right away if you are not doing well or get worse. Document Released: 01/19/2004 Document Revised: 02/16/2013 Document Reviewed: 11/02/2012 [facility] Patient Information 2015 [place], [place]. This information is not intended to replace advice given to you by your health care provider. Make sure you discuss any questions you have with your health care provider. Constipation Constipation is when a person has fewer than three bowel movements a week, has difficulty having a bowel movement, or has stools that are dry, hard, or larger than normal.  As people grow older, constipation is more common. If you try to fix constipation with medicines that make you have a bowel movement (laxatives), the problem may get worse. Long-term laxative use may cause the muscles of the colon to become weak. A low-fiber diet, not taking in enough fluids, and taking certain medicines may make constipation worse.   CAUSES   Certain medicines, such as antidepressants, pain medicine, iron supplements, antacids, and water pills.   Certain diseases, such as diabetes, irritable bowel syndrome (IBS), thyroid disease, or depression.   Not drinking enough water.   Not eating enough fiber-rich foods.   Stress or travel.   Lack of physical activity or exercise.   Ignoring the urge to have a bowel movement.   Using laxatives too much.  SIGNS AND SYMPTOMS   Having fewer than three bowel movements a week.   Straining to have a bowel movement.   Having stools that are hard, dry, or larger than normal.   Feeling full or bloated.   Pain in the lower abdomen.   Not feeling relief after having a bowel movement.  DIAGNOSIS  Your health care provider will take a medical history and perform a physical exam. Further testing may be done for severe constipation. Some tests may include:  A barium enema X-ray to examine your rectum, colon, and, sometimes, your small intestine.   A sigmoidoscopy to examine your lower colon.   A colonoscopy to examine your entire colon. TREATMENT  Treatment will depend on the severity of your constipation and what is causing it. Some dietary treatments include drinking more fluids and eating more fiber-rich foods. Lifestyle treatments may include regular exercise. If these diet and lifestyle recommendations do not help, your health care provider may recommend taking over-the-counter laxative medicines to help you have bowel movements. Prescription medicines may be prescribed if over-the-counter medicines do not work.  HOME CARE INSTRUCTIONS   Eat foods that have a lot of fiber, such as fruits, vegetables, whole grains, and beans.  Limit foods high in fat and processed sugars, such as french fries, hamburgers, cookies, candies, and soda.   A fiber supplement may be added to your diet if you cannot get enough fiber from foods.   Drink enough fluids to keep  your urine clear or pale yellow.   Exercise regularly or as directed by your health care provider.   Go to the restroom when you have the urge to go. Do not hold it.   Only take over-the-counter or prescription medicines as directed by your health care provider. Do not take other medicines for constipation without talking to your health care provider first.  [place] IF:   You have bright red blood in your stool.   Your constipation lasts for more than 4 days or gets worse.   You have abdominal or rectal pain.   You have thin, pencil-like stools.   You have unexplained weight loss. MAKE SURE YOU:   Understand these instructions.  Will watch your condition.  Will get help right away if you are not doing well or get worse. Document Released: 01/25/2004 Document Revised: 05/03/2013 Document Reviewed: 02/07/2013 [facility] Patient Information 2015 [place], [place]. This information is not intended to replace advice given to you by your health care provider. Make sure you discuss any questions you have with your health care provider. Urinary Tract Infection Urinary tract infections (UTIs) can develop anywhere along your urinary tract. Your urinary tract is your body's drainage system for  removing wastes and extra water. Your urinary tract includes two kidneys, two ureters, a bladder, and a urethra. Your kidneys are a pair of bean-shaped organs. Each kidney is about the size of your fist. They are located below your ribs, one on each side of your spine. CAUSES Infections are caused by microbes, which are microscopic organisms, including fungi, viruses, and bacteria. These organisms are so small that they can only be seen through a microscope. Bacteria are the microbes that most commonly cause UTIs. SYMPTOMS  Symptoms of UTIs may vary by age and gender of the patient and by the location of the infection. Symptoms in young women typically include a frequent and intense  urge to urinate and a painful, burning feeling in the bladder or urethra during urination. Older women and men are more likely to be tired, shaky, and weak and have muscle aches and abdominal pain. A fever may mean the infection is in your kidneys. Other symptoms of a kidney infection include pain in your back or sides below the ribs, nausea, and vomiting. DIAGNOSIS To diagnose a UTI, your caregiver will ask you about your symptoms. Your caregiver also will ask to provide a urine sample. The urine sample will be tested for bacteria and white blood cells. White blood cells are made by your body to help fight infection. TREATMENT  Typically, UTIs can be treated with medication. Because most UTIs are caused by a bacterial infection, they usually can be treated with the use of antibiotics. The choice of antibiotic and length of treatment depend on your symptoms and the type of bacteria causing your infection. HOME CARE INSTRUCTIONS If you were prescribed antibiotics, take them exactly as your caregiver instructs you. Finish the medication even if you feel better after you have only taken some of the medication. Drink enough water and fluids to keep your urine clear or pale yellow. Avoid caffeine, tea, and carbonated beverages. They tend to irritate your bladder. Empty your bladder often. Avoid holding urine for long periods of time. Empty your bladder before and after sexual intercourse. After a bowel movement, women should cleanse from front to back. Use each tissue only once. SEEK MEDICAL CARE IF:  You have back pain. You develop a fever. Your symptoms do not begin to resolve within 3 days. SEEK IMMEDIATE MEDICAL CARE IF:  You have severe back pain or lower abdominal pain. You develop chills. You have nausea or vomiting. You have continued burning or discomfort with urination. MAKE SURE YOU:  Understand these instructions. Will watch your condition. Will get help right away if you are not doing  well or get worse. Document Released: 02/05/2005 Document Revised: 10/28/2011 Document Reviewed: 06/06/2011 [facility] Patient Information 2015 [place], [place]. This information is not intended to replace advice given to you by your health care provider. Make sure you discuss any questions you have with your health care provider.

## 2014-09-05 NOTE — Telephone Encounter (Signed)
Attempted to call below # and reached recording that # is out of order. Left message with receptionist at [facility] to have Marykay Lex return my call.

## 2014-09-05 NOTE — Telephone Encounter (Signed)
Ok

## 2014-09-06 ENCOUNTER — Ambulatory Visit: Payer: Self-pay | Admitting: Oncology

## 2014-09-06 ENCOUNTER — Other Ambulatory Visit: Payer: Self-pay

## 2014-09-06 ENCOUNTER — Ambulatory Visit: Payer: BLUE CROSS/BLUE SHIELD | Attending: Gynecologic Oncology | Admitting: Gynecologic Oncology

## 2014-09-06 DIAGNOSIS — C562 Malignant neoplasm of left ovary: Secondary | ICD-10-CM

## 2014-09-06 NOTE — Telephone Encounter (Signed)
Caryl Pina from [facility] called. [facility] # [phone]

## 2014-09-06 NOTE — Progress Notes (Signed)
Patient present for an appointment that was made in error (previously scheduled as staple removal appt meant for Feb 2016).  Discussed current situation with the patient for forty five minutes.  Patient discussing recent visit to ER for constipation and was found to have a UTI as well.  The patient was upset with her husband about him taking her to the ER without trying magnesium citrate first recommended by her home health RN.  He became emotional, stating "We are just trying to help and she thinks we are trying to dump her off."  Patient also voicing concerns about feeling anxious and never being able to be alone at home due to her daughter and grandchildren "always being there and being loud."  Patient stating she would like to be alone sometimes but her husband is concerned that she will fall, etc so the daughter stays with her the majority of the day.  Husband becoming defensive stating "we will just put them out.  Is that what you want."  She states she wants to get out and "go somewhere" while the husband states he feels she is not strong enough.  Husband tearful at times.  Patient with flat affect.  Medications discussed including Alprazolam written by Dr. Jana Hakim.  Patient states she is not taking the medication and did not know what it was for.  Also advised to use Miralax for her bowels but currently having loose stools so advised to not start until loose stools resolve.  Abdomen soft and non-tender today.  No other physical complaints voiced.  Denies fever, chills.  She started her antibiotic for her UTI last pm.  Discussion went on for forty-five minutes and then Johnnye Lana, Education officer, museum, took over the conversation to offer recommendations for their concerns/disputes.  She is advised to call the office for any questions or concerns.  Also advised to call with the development of any symptoms in the early stages so possible interventions can be implemented early.

## 2014-09-07 LAB — URINE CULTURE: Colony Count: >=100000

## 2014-09-08 ENCOUNTER — Telehealth: Payer: Self-pay | Admitting: Gynecologic Oncology

## 2014-09-08 ENCOUNTER — Other Ambulatory Visit (HOSPITAL_BASED_OUTPATIENT_CLINIC_OR_DEPARTMENT_OTHER): Payer: BLUE CROSS/BLUE SHIELD

## 2014-09-08 ENCOUNTER — Other Ambulatory Visit: Payer: Self-pay | Admitting: Gynecologic Oncology

## 2014-09-08 ENCOUNTER — Other Ambulatory Visit (HOSPITAL_COMMUNITY)
Admission: RE | Admit: 2014-09-08 | Discharge: 2014-09-08 | Disposition: A | Discharge status: Home / Self Care | Payer: BLUE CROSS/BLUE SHIELD | Source: Ambulatory Visit | Attending: Oncology | Admitting: Oncology

## 2014-09-08 ENCOUNTER — Telehealth: Payer: Self-pay | Admitting: *Deleted

## 2014-09-08 DIAGNOSIS — R197 Diarrhea, unspecified: Secondary | ICD-10-CM

## 2014-09-08 DIAGNOSIS — C569 Malignant neoplasm of unspecified ovary: Secondary | ICD-10-CM

## 2014-09-08 DIAGNOSIS — C50911 Malignant neoplasm of unspecified site of right female breast: Secondary | ICD-10-CM

## 2014-09-08 DIAGNOSIS — K760 Fatty (change of) liver, not elsewhere classified: Secondary | ICD-10-CM

## 2014-09-08 DIAGNOSIS — Z86718 Personal history of other venous thrombosis and embolism: Secondary | ICD-10-CM | POA: Diagnosis not present

## 2014-09-08 DIAGNOSIS — I4891 Unspecified atrial fibrillation: Secondary | ICD-10-CM

## 2014-09-08 LAB — COMPREHENSIVE METABOLIC PANEL (CC13)
ALBUMIN: 2.7 g/dL — AB (ref 3.5–5.0)
ALT: 22 U/L (ref 0–55)
AST: 36 U/L — ABNORMAL HIGH (ref 5–34)
Alkaline Phosphatase: 166 U/L — ABNORMAL HIGH (ref 40–150)
Anion Gap: 8 mEq/L (ref 3–11)
BUN: 8.6 mg/dL (ref 7.0–26.0)
CO2: 20 mEq/L — ABNORMAL LOW (ref 22–29)
CREATININE: 0.5 mg/dL — AB (ref 0.6–1.1)
Calcium: 8.4 mg/dL (ref 8.4–10.4)
Chloride: 111 mEq/L — ABNORMAL HIGH (ref 98–109)
Glucose: 78 mg/dl (ref 70–140)
Potassium: 3.8 mEq/L (ref 3.5–5.1)
SODIUM: 139 meq/L (ref 136–145)
TOTAL PROTEIN: 6.4 g/dL (ref 6.4–8.3)
Total Bilirubin: 1.11 mg/dL (ref 0.20–1.20)

## 2014-09-08 LAB — CBC WITH DIFFERENTIAL/PLATELET
BASO%: 0.3 % (ref 0.0–2.0)
Basophils Absolute: 0 10*3/uL (ref 0.0–0.1)
EOS%: 2.6 % (ref 0.0–7.0)
Eosinophils Absolute: 0.1 10*3/uL (ref 0.0–0.5)
HEMATOCRIT: 36.4 % (ref 34.8–46.6)
HEMOGLOBIN: 11.9 g/dL (ref 11.6–15.9)
LYMPH#: 0.9 10*3/uL (ref 0.9–3.3)
LYMPH%: 24 % (ref 14.0–49.7)
MCH: 31.6 pg (ref 25.1–34.0)
MCHC: 32.7 g/dL (ref 31.5–36.0)
MCV: 96.8 fL (ref 79.5–101.0)
MONO#: 0.4 10*3/uL (ref 0.1–0.9)
MONO%: 10.2 % (ref 0.0–14.0)
NEUT#: 2.5 10*3/uL (ref 1.5–6.5)
NEUT%: 62.9 % (ref 38.4–76.8)
Platelets: 113 10*3/uL — ABNORMAL LOW (ref 145–400)
RBC: 3.76 10*6/uL (ref 3.70–5.45)
RDW: 19.7 % — ABNORMAL HIGH (ref 11.2–14.5)
WBC: 3.9 10*3/uL (ref 3.9–10.3)

## 2014-09-08 LAB — PROTIME-INR
INR: 2.3 (ref 2.00–3.50)
Protime: 27.6 Seconds — ABNORMAL HIGH (ref 10.6–13.4)

## 2014-09-08 LAB — CLOSTRIDIUM DIFFICILE BY PCR: Toxigenic C. Difficile by PCR: NEGATIVE

## 2014-09-08 NOTE — Progress Notes (Unsigned)
Labs per Rhonda Flow, RN with Dr. Jana Hakim.  Patient's husband called and stated he is bringing her over to have her stool checked since she had another episode.  She has not taken the Miralax that was recommended during her recent ED visit.  Taking abxs for a UTI.

## 2014-09-08 NOTE — Telephone Encounter (Signed)
This RN spoke with pt and husband post lab draw.  CBC reviewed with good results.

## 2014-09-08 NOTE — Telephone Encounter (Signed)
LVM for call back

## 2014-09-08 NOTE — Telephone Encounter (Signed)
Patient's husband informed that c diff was negative.  No concerns voiced.  Advised to call for any questions or concerns.

## 2014-09-08 NOTE — Telephone Encounter (Signed)
Returned call to husband.  Husband reporting the wife (patient) is "doing good today but had a real real bad day yesterday."  She has been having diarrhea, two during the night and one this am, but he feels it is slowing down.  Advised to monitor and increase fluids.  He is advised to call if the diarrhea persists or worsens today so she can come in and give a stool sample to r/o c diff.  Also discussing insurance policy and needing a form or handout stating what type of cancer his wife had.  He is to call the office if he needs further info.

## 2014-09-08 NOTE — Progress Notes (Signed)
ED Antimicrobial Stewardship Positive Culture Follow Up   [name] is an 59 y.o. female who presented to [facility] on 09/04/2014 with a chief complaint of  Chief Complaint  Patient presents with  . Constipation    Recent Results (from the past 720 hour(s))  Urine culture     Status: None   Collection Time: 09/04/14 10:56 PM  Result Value Ref Range Status   Specimen Description URINE, CLEAN CATCH  Final   Special Requests NONE  Final   Colony Count   Final    >=100,000 COLONIES/ML Performed at [facility]    Culture   Final    KLEBSIELLA PNEUMONIAE Performed at [facility]    Report Status 09/07/2014 FINAL  Final   Organism ID, Bacteria KLEBSIELLA PNEUMONIAE  Final      Susceptibility   Klebsiella pneumoniae - MIC*    AMPICILLIN RESISTANT      CEFAZOLIN <=4 SENSITIVE Sensitive     CEFTRIAXONE <=1 SENSITIVE Sensitive     CIPROFLOXACIN <=0.25 SENSITIVE Sensitive     GENTAMICIN <=1 SENSITIVE Sensitive     LEVOFLOXACIN <=0.12 SENSITIVE Sensitive     NITROFURANTOIN 64 INTERMEDIATE Intermediate     TOBRAMYCIN <=1 SENSITIVE Sensitive     TRIMETH/SULFA <=20 SENSITIVE Sensitive     PIP/TAZO <=4 SENSITIVE Sensitive     * KLEBSIELLA PNEUMONIAE    [x]  Treated with augmentin, organism resistant to prescribed antimicrobial Pt was dc on augmentin for UTI. Came back with kleb that is resistant to it. Going to use keflex since she is on coumadin  New antibiotic prescription:  Dc Augmetin Keflex 500mg  PO TID x 7 days  ED Provider: Alvino Chapel, NP  Onnie Boer, PharmD Pager: [phone] Infectious Diseases Pharmacist Phone# [phone]

## 2014-09-09 ENCOUNTER — Telehealth (HOSPITAL_BASED_OUTPATIENT_CLINIC_OR_DEPARTMENT_OTHER): Payer: Self-pay | Admitting: *Deleted

## 2014-09-10 LAB — CA 125: CA 125: 213 U/mL — AB (ref <35)

## 2014-09-11 ENCOUNTER — Telehealth: Payer: Self-pay

## 2014-09-11 ENCOUNTER — Ambulatory Visit (INDEPENDENT_AMBULATORY_CARE_PROVIDER_SITE_OTHER): Payer: BLUE CROSS/BLUE SHIELD | Admitting: Pharmacist

## 2014-09-11 ENCOUNTER — Telehealth: Payer: Self-pay | Admitting: *Deleted

## 2014-09-11 DIAGNOSIS — Z86718 Personal history of other venous thrombosis and embolism: Secondary | ICD-10-CM

## 2014-09-11 DIAGNOSIS — I4891 Unspecified atrial fibrillation: Secondary | ICD-10-CM

## 2014-09-11 DIAGNOSIS — C569 Malignant neoplasm of unspecified ovary: Secondary | ICD-10-CM

## 2014-09-11 LAB — POCT INR: INR: 2.2

## 2014-09-11 NOTE — Telephone Encounter (Signed)
INFORMED PT.'S HUSBAND THAT MANAGED CARE IS WORKING ON THE [employer] FORM. IT WILL BE COMPLETED IN SEVEN TO TEN BUSINESS DAYS.

## 2014-09-11 NOTE — Telephone Encounter (Signed)
Left detailed message on [name]'s voicemail re: ok to proceed with orders below and to call if any further questions / concerns.

## 2014-09-11 NOTE — Progress Notes (Signed)
INR at goal today at 2.2 (goal 2-3) *No charge - Telephone Encoutner* Spoke with Caryl Pina, RN with [facility]  Pt is doing well with no complaints Pt states she has now completed course of antibiotics (started on augmentin, changed to Keflex due to drug resistance and pt on coumadin) Keflex should have minimal effect on coumadin and there appears to be no effect as INR is at goal No other diet or medication changes No missed or extra doses No unusual bleeding or bruising Plan: No changes Continue Coumadin 3 mg daily except for 4.5 mg (1.5 tablets) on Mondays, Wednesdays and Fridays.  [facility] RN will draw PT/INR on Monday 5/9 and call results to Coumadin clinic.

## 2014-09-11 NOTE — Telephone Encounter (Signed)
Pathology Reports received dtd 06/29/14.  Copy to GYN.  Reviewed by Dr. Jana Hakim, sent to scan.

## 2014-09-11 NOTE — Patient Instructions (Signed)
INR at goal No changes Continue Coumadin 3 mg daily except for 4.5 mg (1.5 tablets) on Mondays, Wednesdays and Fridays.  [facility] RN will draw PT/INR on Monday 5/9 and call results to Coumadin clinic.

## 2014-09-12 ENCOUNTER — Encounter: Payer: Self-pay | Admitting: Oncology

## 2014-09-12 NOTE — Progress Notes (Signed)
I called and spoke with [name]. I advised him he billing info will need to come from hospital. He said ok to go ahead and fax the forms and labs to [facility] [phone]. He wants copy mailed to him. I verified his address

## 2014-09-13 ENCOUNTER — Ambulatory Visit: Payer: Self-pay | Admitting: Oncology

## 2014-09-14 ENCOUNTER — Telehealth: Payer: Self-pay | Admitting: *Deleted

## 2014-09-15 ENCOUNTER — Encounter: Payer: Self-pay | Admitting: Family

## 2014-09-15 ENCOUNTER — Ambulatory Visit (INDEPENDENT_AMBULATORY_CARE_PROVIDER_SITE_OTHER): Payer: BLUE CROSS/BLUE SHIELD | Admitting: Family

## 2014-09-15 ENCOUNTER — Telehealth: Payer: Self-pay | Admitting: *Deleted

## 2014-09-15 VITALS — BP 122/86 | HR 87 | Temp 98.3°F | Resp 16 | Ht 64.0 in | Wt 163.0 lb

## 2014-09-15 DIAGNOSIS — Z0000 Encounter for general adult medical examination without abnormal findings: Secondary | ICD-10-CM | POA: Diagnosis not present

## 2014-09-15 DIAGNOSIS — F32A Depression, unspecified: Secondary | ICD-10-CM

## 2014-09-15 DIAGNOSIS — R630 Anorexia: Secondary | ICD-10-CM

## 2014-09-15 DIAGNOSIS — F329 Major depressive disorder, single episode, unspecified: Secondary | ICD-10-CM

## 2014-09-15 LAB — LIPID PANEL
Cholesterol: 133 mg/dL (ref 0–200)
HDL: 18 mg/dL — AB (ref >=46)
LDL Cholesterol: 92 mg/dL (ref 0–99)
TRIGLYCERIDES: 116 mg/dL (ref <150)
Total CHOL/HDL Ratio: 7.4 Ratio
VLDL: 23 mg/dL (ref 0–40)

## 2014-09-15 MED ORDER — BUPROPION HCL ER (XL) 150 MG PO TB24: 150.0000 mg | ORAL_TABLET | Freq: Every day | ORAL | Status: DC

## 2014-09-15 NOTE — Progress Notes (Signed)
Pre visit review using our clinic review tool, if applicable. No additional management support is needed unless otherwise documented below in the visit note. 

## 2014-09-15 NOTE — Telephone Encounter (Signed)
Unable to reach patient at time of Pre-Visit Call.  Left message for patient to return call when available.    
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
## 2014-09-15 NOTE — Patient Instructions (Addendum)
Stop megace, continue ensure 2 times a day. Complete stool test and return by mail at your earliest convenience. Add Wellbutrin xl 150mg  once daily. Follow up in 1 month.

## 2014-09-15 NOTE — Telephone Encounter (Signed)
Wellbutrin XL  rx sent to [facility] in error. New medication start and will get 30 day supply at local pharmacy. Fax sent to [facility] to cancel Rx and resent to [facility] pharmacy via eRx.

## 2014-09-16 ENCOUNTER — Other Ambulatory Visit: Payer: Self-pay | Admitting: Oncology

## 2014-09-16 ENCOUNTER — Other Ambulatory Visit: Payer: Self-pay | Admitting: Family

## 2014-09-16 LAB — URINALYSIS, ROUTINE W REFLEX MICROSCOPIC
Bilirubin Urine: NEGATIVE
Glucose, UA: NEGATIVE mg/dL
Hgb urine dipstick: NEGATIVE
KETONES UR: NEGATIVE mg/dL
Nitrite: NEGATIVE
PROTEIN: NEGATIVE mg/dL
Specific Gravity, Urine: 1.021 (ref 1.005–1.030)
Urobilinogen, UA: 1 mg/dL (ref 0.0–1.0)
pH: 7 (ref 5.0–8.0)

## 2014-09-16 LAB — URINALYSIS, MICROSCOPIC ONLY
BACTERIA UA: NONE SEEN
Casts: NONE SEEN

## 2014-09-16 LAB — TSH: TSH: 1.944 u[IU]/mL (ref 0.350–4.500)

## 2014-09-17 ENCOUNTER — Telehealth: Payer: Self-pay | Admitting: Family

## 2014-09-17 DIAGNOSIS — Z0000 Encounter for general adult medical examination without abnormal findings: Secondary | ICD-10-CM | POA: Insufficient documentation

## 2014-09-17 NOTE — Assessment & Plan Note (Signed)
Advised pt- ok to stop megace.  Continue ensure, weigh frequently and let me know if she has any further weight loss.

## 2014-09-17 NOTE — Assessment & Plan Note (Signed)
Deteriorated, add wellbutrin.  Denies SI/HI.

## 2014-09-17 NOTE — Telephone Encounter (Signed)
Urinalysis shows possible UTI.  I would like her to return for urine culture please. Thyroid looks good. Cholesterol overall good, but HDL "good cholesterol" should be higher. Regular exercise can bring this up.

## 2014-09-18 ENCOUNTER — Other Ambulatory Visit: Payer: Self-pay | Admitting: *Deleted

## 2014-09-18 ENCOUNTER — Ambulatory Visit (INDEPENDENT_AMBULATORY_CARE_PROVIDER_SITE_OTHER): Payer: BLUE CROSS/BLUE SHIELD | Admitting: Pharmacist

## 2014-09-18 ENCOUNTER — Encounter: Payer: Self-pay | Admitting: Pharmacist

## 2014-09-18 DIAGNOSIS — C569 Malignant neoplasm of unspecified ovary: Secondary | ICD-10-CM

## 2014-09-18 DIAGNOSIS — Z86718 Personal history of other venous thrombosis and embolism: Secondary | ICD-10-CM

## 2014-09-18 LAB — POCT INR: INR: 1.8

## 2014-09-18 NOTE — Progress Notes (Signed)
INR = 1.8 - drawn externally at pts home by [facility] RN, Caryl Pina.  Report received over phone (NO CHARGE encounter) Current Coumadin dose is 3 mg/day except 4.5 mg MWF. Pt has been eating better-- 2 meals/day. No other significant changes. No new meds. No bleeding/bruising. INR slightly low today.  Take 4.5 mg today then increase to 4.5 mg/day except 3 mg on MWF. Repeat protime in 1 week by [facility].  RN aware of plan.  She will call results in next week. Kennith Center, Pharm.D., CPP 09/18/2014@12 :36 PM

## 2014-09-18 NOTE — Progress Notes (Signed)
See Anti-coag encounter. Documentation encounter entered in error.

## 2014-09-18 NOTE — Telephone Encounter (Signed)
Notified pt's husband and he voices understanding. 

## 2014-09-18 NOTE — Telephone Encounter (Signed)
No CSC on file. Last rx 08/14/14. Rx printed and forwarded to Provider for signature. Pt has f/u 10/05/14.  Will have pt complete CSC at that visit.

## 2014-09-18 NOTE — Telephone Encounter (Signed)
Notified pt's husband. He states pt was recently treated for UTI by the ER and was supposed to be taking it three times daily and she had only been taking it twice a day. States she has 7 more pills left to take. Will not be able to leave urine culture until Friday.  Please advise.

## 2014-09-18 NOTE — Telephone Encounter (Signed)
Since she is already on abx, then we do not need to repeat culture unless she develops recurrent UTI symptoms.  (burning frequency)

## 2014-09-19 NOTE — Telephone Encounter (Signed)
Rx faxed to pharmacy on 09/18/14 at 3:10pm.

## 2014-09-22 ENCOUNTER — Telehealth: Payer: Self-pay | Admitting: Family

## 2014-09-22 NOTE — Telephone Encounter (Signed)
Caller name: donald Relation to [id number] Call back number: [phone] Pharmacy: [facility]  Reason for call:   Patient husband states that patient is now having swelling in her feet and would like to know if Melissa would call in something for this.

## 2014-09-22 NOTE — Telephone Encounter (Signed)
Spoke with pt's spouse. He states that pt has been having swelling in her feet by the end of the day. Was up on her feet most of the day yesterday. Denies redness or either leg, no chest pain / shortness of breath. I asked him if pt is taking furosemide 20mg  daily. He asked if it is on her current med list. When I said that it is he said "then she's taking it." I asked to him to verify that she hasn't missed any doses and to let us know if she has. He voices understanding.  Please advise.

## 2014-09-24 NOTE — Telephone Encounter (Signed)
As long as she is not short of breath or having significant weight gain:  1) 3 pound weight gain in 24 hours or 5 pounds in 1 week 2) shortness of breath, with or without a dry hacking cough 3) swelling in the hands, feet or stomach 4) if you have to sleep on extra pillows at night in order to breathe.   Continue lasix, elevate legs as able.

## 2014-09-25 ENCOUNTER — Encounter (HOSPITAL_COMMUNITY): Payer: Self-pay

## 2014-09-25 ENCOUNTER — Telehealth: Payer: Self-pay | Admitting: *Deleted

## 2014-09-25 ENCOUNTER — Other Ambulatory Visit (HOSPITAL_BASED_OUTPATIENT_CLINIC_OR_DEPARTMENT_OTHER): Payer: BLUE CROSS/BLUE SHIELD

## 2014-09-25 ENCOUNTER — Inpatient Hospital Stay (HOSPITAL_COMMUNITY)
Admission: EM | Admit: 2014-09-25 | Discharge: 2014-10-02 | DRG: 263 | Disposition: A | Discharge status: Home / Self Care | Payer: BLUE CROSS/BLUE SHIELD | Attending: Internal Medicine | Admitting: Internal Medicine

## 2014-09-25 ENCOUNTER — Ambulatory Visit (HOSPITAL_BASED_OUTPATIENT_CLINIC_OR_DEPARTMENT_OTHER)
Admission: RE | Admit: 2014-09-25 | Discharge: 2014-09-25 | Disposition: A | Discharge status: Home / Self Care | Payer: BLUE CROSS/BLUE SHIELD | Source: Ambulatory Visit | Attending: Oncology | Admitting: Oncology

## 2014-09-25 ENCOUNTER — Other Ambulatory Visit: Payer: Self-pay

## 2014-09-25 ENCOUNTER — Other Ambulatory Visit (HOSPITAL_COMMUNITY): Payer: Self-pay | Admitting: Oncology

## 2014-09-25 ENCOUNTER — Ambulatory Visit (HOSPITAL_BASED_OUTPATIENT_CLINIC_OR_DEPARTMENT_OTHER): Payer: BLUE CROSS/BLUE SHIELD | Admitting: Oncology

## 2014-09-25 ENCOUNTER — Other Ambulatory Visit: Payer: Self-pay | Admitting: *Deleted

## 2014-09-25 ENCOUNTER — Other Ambulatory Visit (HOSPITAL_COMMUNITY): Payer: Self-pay

## 2014-09-25 ENCOUNTER — Telehealth: Payer: Self-pay | Admitting: Oncology

## 2014-09-25 VITALS — BP 133/81 | HR 76 | Temp 98.0°F | Resp 18 | Ht 64.0 in | Wt 159.2 lb

## 2014-09-25 DIAGNOSIS — R791 Abnormal coagulation profile: Secondary | ICD-10-CM | POA: Diagnosis present

## 2014-09-25 DIAGNOSIS — D6959 Other secondary thrombocytopenia: Secondary | ICD-10-CM | POA: Diagnosis present

## 2014-09-25 DIAGNOSIS — K760 Fatty (change of) liver, not elsewhere classified: Secondary | ICD-10-CM

## 2014-09-25 DIAGNOSIS — E1165 Type 2 diabetes mellitus with hyperglycemia: Secondary | ICD-10-CM

## 2014-09-25 DIAGNOSIS — Z9221 Personal history of antineoplastic chemotherapy: Secondary | ICD-10-CM | POA: Diagnosis not present

## 2014-09-25 DIAGNOSIS — C50911 Malignant neoplasm of unspecified site of right female breast: Secondary | ICD-10-CM

## 2014-09-25 DIAGNOSIS — Z86718 Personal history of other venous thrombosis and embolism: Secondary | ICD-10-CM

## 2014-09-25 DIAGNOSIS — M7989 Other specified soft tissue disorders: Secondary | ICD-10-CM | POA: Insufficient documentation

## 2014-09-25 DIAGNOSIS — Z853 Personal history of malignant neoplasm of breast: Secondary | ICD-10-CM | POA: Diagnosis not present

## 2014-09-25 DIAGNOSIS — K219 Gastro-esophageal reflux disease without esophagitis: Secondary | ICD-10-CM | POA: Diagnosis present

## 2014-09-25 DIAGNOSIS — A414 Sepsis due to anaerobes: Secondary | ICD-10-CM | POA: Insufficient documentation

## 2014-09-25 DIAGNOSIS — Z7901 Long term (current) use of anticoagulants: Secondary | ICD-10-CM

## 2014-09-25 DIAGNOSIS — F329 Major depressive disorder, single episode, unspecified: Secondary | ICD-10-CM | POA: Diagnosis present

## 2014-09-25 DIAGNOSIS — I82403 Acute embolism and thrombosis of unspecified deep veins of lower extremity, bilateral: Secondary | ICD-10-CM | POA: Diagnosis not present

## 2014-09-25 DIAGNOSIS — E039 Hypothyroidism, unspecified: Secondary | ICD-10-CM | POA: Diagnosis present

## 2014-09-25 DIAGNOSIS — I4891 Unspecified atrial fibrillation: Secondary | ICD-10-CM

## 2014-09-25 DIAGNOSIS — Z87891 Personal history of nicotine dependence: Secondary | ICD-10-CM

## 2014-09-25 DIAGNOSIS — I5032 Chronic diastolic (congestive) heart failure: Secondary | ICD-10-CM | POA: Diagnosis present

## 2014-09-25 DIAGNOSIS — I82413 Acute embolism and thrombosis of femoral vein, bilateral: Secondary | ICD-10-CM | POA: Diagnosis present

## 2014-09-25 DIAGNOSIS — C569 Malignant neoplasm of unspecified ovary: Secondary | ICD-10-CM | POA: Diagnosis present

## 2014-09-25 DIAGNOSIS — K7460 Unspecified cirrhosis of liver: Secondary | ICD-10-CM | POA: Diagnosis present

## 2014-09-25 DIAGNOSIS — I480 Paroxysmal atrial fibrillation: Secondary | ICD-10-CM

## 2014-09-25 DIAGNOSIS — Z79899 Other long term (current) drug therapy: Secondary | ICD-10-CM

## 2014-09-25 DIAGNOSIS — E785 Hyperlipidemia, unspecified: Secondary | ICD-10-CM | POA: Diagnosis present

## 2014-09-25 DIAGNOSIS — A4159 Other Gram-negative sepsis: Secondary | ICD-10-CM

## 2014-09-25 DIAGNOSIS — I5042 Chronic combined systolic (congestive) and diastolic (congestive) heart failure: Secondary | ICD-10-CM | POA: Diagnosis present

## 2014-09-25 DIAGNOSIS — M79662 Pain in left lower leg: Secondary | ICD-10-CM | POA: Diagnosis present

## 2014-09-25 DIAGNOSIS — E876 Hypokalemia: Secondary | ICD-10-CM | POA: Diagnosis not present

## 2014-09-25 DIAGNOSIS — F419 Anxiety disorder, unspecified: Secondary | ICD-10-CM | POA: Diagnosis present

## 2014-09-25 DIAGNOSIS — Z923 Personal history of irradiation: Secondary | ICD-10-CM | POA: Diagnosis not present

## 2014-09-25 DIAGNOSIS — G4733 Obstructive sleep apnea (adult) (pediatric): Secondary | ICD-10-CM | POA: Diagnosis present

## 2014-09-25 DIAGNOSIS — K7581 Nonalcoholic steatohepatitis (NASH): Secondary | ICD-10-CM | POA: Diagnosis present

## 2014-09-25 DIAGNOSIS — E119 Type 2 diabetes mellitus without complications: Secondary | ICD-10-CM | POA: Diagnosis present

## 2014-09-25 DIAGNOSIS — T451X5A Adverse effect of antineoplastic and immunosuppressive drugs, initial encounter: Secondary | ICD-10-CM | POA: Diagnosis present

## 2014-09-25 DIAGNOSIS — I10 Essential (primary) hypertension: Secondary | ICD-10-CM | POA: Diagnosis present

## 2014-09-25 DIAGNOSIS — M8580 Other specified disorders of bone density and structure, unspecified site: Secondary | ICD-10-CM | POA: Diagnosis present

## 2014-09-25 DIAGNOSIS — Z79811 Long term (current) use of aromatase inhibitors: Secondary | ICD-10-CM | POA: Diagnosis not present

## 2014-09-25 DIAGNOSIS — D696 Thrombocytopenia, unspecified: Secondary | ICD-10-CM | POA: Diagnosis present

## 2014-09-25 DIAGNOSIS — C50811 Malignant neoplasm of overlapping sites of right female breast: Secondary | ICD-10-CM | POA: Diagnosis present

## 2014-09-25 DIAGNOSIS — Z170 Estrogen receptor positive status [ER+]: Secondary | ICD-10-CM

## 2014-09-25 DIAGNOSIS — D630 Anemia in neoplastic disease: Secondary | ICD-10-CM | POA: Diagnosis present

## 2014-09-25 DIAGNOSIS — I82401 Acute embolism and thrombosis of unspecified deep veins of right lower extremity: Secondary | ICD-10-CM

## 2014-09-25 DIAGNOSIS — I482 Chronic atrial fibrillation, unspecified: Secondary | ICD-10-CM

## 2014-09-25 DIAGNOSIS — IMO0002 Reserved for concepts with insufficient information to code with codable children: Secondary | ICD-10-CM

## 2014-09-25 LAB — CBC WITH DIFFERENTIAL/PLATELET
BASO%: 0.7 % (ref 0.0–2.0)
BASOS ABS: 0 10*3/uL (ref 0.0–0.1)
EOS%: 2.3 % (ref 0.0–7.0)
Eosinophils Absolute: 0.1 10*3/uL (ref 0.0–0.5)
HCT: 36.9 % (ref 34.8–46.6)
HEMOGLOBIN: 12 g/dL (ref 11.6–15.9)
LYMPH#: 0.6 10*3/uL — AB (ref 0.9–3.3)
LYMPH%: 12.3 % — ABNORMAL LOW (ref 14.0–49.7)
MCH: 31.5 pg (ref 25.1–34.0)
MCHC: 32.5 g/dL (ref 31.5–36.0)
MCV: 96.9 fL (ref 79.5–101.0)
MONO#: 0.5 10*3/uL (ref 0.1–0.9)
MONO%: 9.4 % (ref 0.0–14.0)
NEUT#: 3.9 10*3/uL (ref 1.5–6.5)
NEUT%: 75.3 % (ref 38.4–76.8)
Platelets: 138 10*3/uL — ABNORMAL LOW (ref 145–400)
RBC: 3.8 10*6/uL (ref 3.70–5.45)
RDW: 18 % — AB (ref 11.2–14.5)
WBC: 5.2 10*3/uL (ref 3.9–10.3)

## 2014-09-25 LAB — COMPREHENSIVE METABOLIC PANEL (CC13)
ALT: 15 U/L (ref 0–55)
ANION GAP: 13 meq/L — AB (ref 3–11)
AST: 31 U/L (ref 5–34)
Albumin: 2.7 g/dL — ABNORMAL LOW (ref 3.5–5.0)
Alkaline Phosphatase: 130 U/L (ref 40–150)
BUN: 7.8 mg/dL (ref 7.0–26.0)
CALCIUM: 8.3 mg/dL — AB (ref 8.4–10.4)
CO2: 21 meq/L — AB (ref 22–29)
CREATININE: 0.5 mg/dL — AB (ref 0.6–1.1)
Chloride: 107 mEq/L (ref 98–109)
EGFR: >90 mL/min/{1.73_m2} (ref >90)
Glucose: 83 mg/dl (ref 70–140)
Potassium: 3.7 mEq/L (ref 3.5–5.1)
Sodium: 141 mEq/L (ref 136–145)
Total Bilirubin: 1.06 mg/dL (ref 0.20–1.20)
Total Protein: 6.7 g/dL (ref 6.4–8.3)

## 2014-09-25 LAB — PROTIME-INR
INR: 1.5 — AB (ref 2.00–3.50)
PROTIME: 18 s — AB (ref 10.6–13.4)

## 2014-09-25 LAB — MRSA PCR SCREENING: MRSA by PCR: NEGATIVE

## 2014-09-25 MED ORDER — CLONAZEPAM 0.5 MG PO TABS: 0.2500 mg | ORAL_TABLET | Freq: Two times a day (BID) | ORAL | Status: DC | PRN

## 2014-09-25 MED ORDER — FUROSEMIDE 20 MG PO TABS
20.0000 mg | ORAL_TABLET | Freq: Every day | ORAL | Status: DC
Start: 2014-09-25 — End: 2014-10-02
  Administered 2014-09-26 – 2014-10-02 (×7): 20 mg via ORAL
  Filled 2014-09-25 (×7): qty 1

## 2014-09-25 MED ORDER — ENOXAPARIN SODIUM 100 MG/ML ~~LOC~~ SOLN: 1.0000 mg/kg | Freq: Once | SUBCUTANEOUS | Status: DC

## 2014-09-25 MED ORDER — METOPROLOL TARTRATE 50 MG PO TABS
50.0000 mg | ORAL_TABLET | Freq: Two times a day (BID) | ORAL | Status: DC
  Administered 2014-09-26 – 2014-10-02 (×14): 50 mg via ORAL
  Filled 2014-09-25: qty 2
  Filled 2014-09-25 (×6): qty 1
  Filled 2014-09-25: qty 2
  Filled 2014-09-25 (×7): qty 1

## 2014-09-25 MED ORDER — WARFARIN SODIUM 4 MG PO TABS
4.5000 mg | ORAL_TABLET | Freq: Once | ORAL | Status: AC
  Administered 2014-09-25: 4.5 mg via ORAL
  Filled 2014-09-25: qty 1

## 2014-09-25 MED ORDER — ONDANSETRON HCL 4 MG/2ML IJ SOLN: 4.0000 mg | Freq: Four times a day (QID) | INTRAMUSCULAR | Status: DC | PRN

## 2014-09-25 MED ORDER — PROCHLORPERAZINE MALEATE 10 MG PO TABS: 10.0000 mg | ORAL_TABLET | Freq: Four times a day (QID) | ORAL | Status: DC | PRN

## 2014-09-25 MED ORDER — OLANZAPINE 2.5 MG PO TABS
2.5000 mg | ORAL_TABLET | Freq: Every day | ORAL | Status: DC
  Administered 2014-09-26 – 2014-10-01 (×7): 2.5 mg via ORAL
  Filled 2014-09-25 (×9): qty 1

## 2014-09-25 MED ORDER — HEPARIN BOLUS VIA INFUSION
4000.0000 [IU] | Freq: Once | INTRAVENOUS | Status: AC
  Administered 2014-09-25: 4000 [IU] via INTRAVENOUS
  Filled 2014-09-25: qty 4000

## 2014-09-25 MED ORDER — CEPHALEXIN 500 MG PO CAPS: 500.0000 mg | ORAL_CAPSULE | Freq: Three times a day (TID) | ORAL | Status: DC

## 2014-09-25 MED ORDER — LETROZOLE 2.5 MG PO TABS
2.5000 mg | ORAL_TABLET | Freq: Every day | ORAL | Status: DC
  Administered 2014-09-26 – 2014-10-02 (×8): 2.5 mg via ORAL
  Filled 2014-09-25 (×8): qty 1

## 2014-09-25 MED ORDER — ONDANSETRON HCL 8 MG PO TABS
8.0000 mg | ORAL_TABLET | Freq: Two times a day (BID) | ORAL | Status: DC
Start: 2014-09-25 — End: 2014-10-13

## 2014-09-25 MED ORDER — ACETAMINOPHEN 650 MG RE SUPP: 650.0000 mg | Freq: Four times a day (QID) | RECTAL | Status: DC | PRN

## 2014-09-25 MED ORDER — BUPROPION HCL ER (XL) 150 MG PO TB24
150.0000 mg | ORAL_TABLET | Freq: Every day | ORAL | Status: DC
  Administered 2014-09-26 – 2014-10-02 (×8): 150 mg via ORAL
  Filled 2014-09-25 (×8): qty 1

## 2014-09-25 MED ORDER — ALPRAZOLAM ER 1 MG PO TB24
1.0000 mg | ORAL_TABLET | Freq: Two times a day (BID) | ORAL | Status: DC
  Administered 2014-09-26 – 2014-09-28 (×6): 1 mg via ORAL
  Filled 2014-09-25 (×6): qty 1

## 2014-09-25 MED ORDER — WARFARIN - PHARMACIST DOSING INPATIENT: Freq: Every day | Status: DC

## 2014-09-25 MED ORDER — OXYCODONE HCL 5 MG PO TABS
5.0000 mg | ORAL_TABLET | ORAL | Status: DC | PRN
  Administered 2014-09-27: 5 mg via ORAL
  Filled 2014-09-25 (×2): qty 1

## 2014-09-25 MED ORDER — HEPARIN (PORCINE) IN NACL 100-0.45 UNIT/ML-% IJ SOLN
1200.0000 [IU]/h | INTRAMUSCULAR | Status: DC
  Administered 2014-09-25: 1200 [IU]/h via INTRAVENOUS
  Filled 2014-09-25: qty 250

## 2014-09-25 MED ORDER — ONDANSETRON HCL 4 MG PO TABS: 4.0000 mg | ORAL_TABLET | Freq: Four times a day (QID) | ORAL | Status: DC | PRN

## 2014-09-25 MED ORDER — SODIUM CHLORIDE 0.9 % IJ SOLN
3.0000 mL | Freq: Two times a day (BID) | INTRAMUSCULAR | Status: DC
  Administered 2014-09-27 – 2014-09-29 (×4): 3 mL via INTRAVENOUS

## 2014-09-25 MED ORDER — POTASSIUM CHLORIDE ER 10 MEQ PO TBCR
10.0000 meq | EXTENDED_RELEASE_TABLET | Freq: Two times a day (BID) | ORAL | Status: DC
  Administered 2014-09-26 – 2014-10-02 (×14): 10 meq via ORAL
  Filled 2014-09-25 (×21): qty 1

## 2014-09-25 MED ORDER — ACETAMINOPHEN 325 MG PO TABS
650.0000 mg | ORAL_TABLET | Freq: Four times a day (QID) | ORAL | Status: DC | PRN
Start: 2014-09-25 — End: 2014-10-02

## 2014-09-25 MED ORDER — ESCITALOPRAM OXALATE 20 MG PO TABS
20.0000 mg | ORAL_TABLET | Freq: Every day | ORAL | Status: DC
  Administered 2014-09-26 – 2014-10-02 (×8): 20 mg via ORAL
  Filled 2014-09-25 (×8): qty 1

## 2014-09-25 MED ORDER — SACCHAROMYCES BOULARDII 250 MG PO CAPS
250.0000 mg | ORAL_CAPSULE | Freq: Two times a day (BID) | ORAL | Status: DC
  Administered 2014-09-26 – 2014-10-02 (×14): 250 mg via ORAL
  Filled 2014-09-25 (×15): qty 1

## 2014-09-25 MED ORDER — GLUCERNA SHAKE PO LIQD
237.0000 mL | Freq: Three times a day (TID) | ORAL | Status: DC
  Administered 2014-09-26 (×2): 237 mL via ORAL
  Filled 2014-09-25 (×3): qty 237

## 2014-09-25 MED ORDER — POLYETHYLENE GLYCOL 3350 17 G PO PACK
17.0000 g | PACK | Freq: Every day | ORAL | Status: DC
  Administered 2014-09-26 – 2014-10-01 (×2): 17 g via ORAL
  Filled 2014-09-25 (×7): qty 1

## 2014-09-25 MED ORDER — LEVOTHYROXINE SODIUM 150 MCG PO TABS
150.0000 ug | ORAL_TABLET | Freq: Every day | ORAL | Status: DC
  Administered 2014-09-26 – 2014-10-02 (×7): 150 ug via ORAL
  Filled 2014-09-25 (×2): qty 1
  Filled 2014-09-25 (×2): qty 2
  Filled 2014-09-25 (×2): qty 1
  Filled 2014-09-25: qty 2
  Filled 2014-09-25 (×4): qty 1

## 2014-09-25 NOTE — Telephone Encounter (Signed)
Gave avs & calendar for May thru July. Sent message to schedule treatment.

## 2014-09-25 NOTE — ED Notes (Signed)
Meal Tray ordered.  

## 2014-09-25 NOTE — ED Provider Notes (Signed)
CSN: [id number]     Arrival date & time 09/25/14  1523 History   First MD Initiated Contact with Patient 09/25/14 1525     Chief Complaint  Patient presents with  . Leg Swelling     (Consider location/radiation/quality/duration/timing/severity/associated sxs/prior Treatment) HPI [name] is a 59 y.o. female with multiple medical problems, including DVTs, liver disease, A. fib, currently being treated for ovarian cancer, due to restart chemotherapy in 4 days, presents to ED with bilateral DVTs. Patient states she has had swelling in left leg since Thursday last week, approximately 4 days. States she told her oncologist, Dr. Jana Hakim, who ordered a vascular study. Patient was sent here directly from the vascular lab with results of bilateral DVTs with mobile clot in the right leg. Pt denies any chest pain or shortness of breath. States she is on coumadin and has not missed any doses. INR this morning is 1.5  Past Medical History  Diagnosis Date  . Depression   . DVT (deep venous thrombosis)     hx of on HRT left leg ~2006  . GERD (gastroesophageal reflux disease)   . Hyperlipidemia   . Hypertension   . Hypothyroidism   . PPD positive, treated     rx inh   . Diabetes mellitus   . 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 DJD (degenerative joint disease) of lumbar spine   . Chronic combined systolic and diastolic CHF (congestive heart failure)     a. 08/2012 Echo: EF 55-60%, no rwma, mod MR;  b. 07/2014 Echo: EF 45-50%, distal antsept HK, mod TR/MR, mildly bil-atrial enlargement.  . Paroxysmal atrial fibrillation     a. CHA2DS2VASc = 4-->coumadin.  [name] Physical deconditioning   . Allergy   . Anemia     low iron hx  . Cataract     removed ou  . OSA on CPAP     cpap 4.5 setting  . Breast cancer 2014    a. 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 Clear  Cell Ovarian Cancer     a. 06/2014 s/p TAH/BSO debulking/lymph node dissection;  b. Now on chemo.   Past Surgical History  Procedure Laterality Date  . Tubal ligation    . Cholecystectomy    . Foot surgery    . Eye surgery    . Cataract extraction    . Percutaneous liver biopsy    . Breast biopsy      left breast  . Mastectomy modified radical  06/08/2012    Procedure: MASTECTOMY MODIFIED RADICAL;  Surgeon: Edward Jolly, MD;  Location: [facility];  Service: General;  Laterality: Right;  . Portacath placement  06/08/2012    Procedure: INSERTION PORT-A-CATH;  Surgeon: Edward Jolly, MD;  Location: [facility];  Service: General;  Laterality: Left;  . Peg tube placement      peg removed 2015  . History of chemotherapy x 2 treatments, radiation tx  2014  . Pac removed  2015  . Laparotomy N/A 06/27/2014    Procedure: EXPLORATORY LAPAROTOMY;  Surgeon: Everitt Amber, MD;  Location: [facility];  Service: Gynecology;  Laterality: N/A;  . Abdominal hysterectomy N/A 06/27/2014    Procedure: HYSTERECTOMY ABDOMINAL TOTAL;  Surgeon: Everitt Amber, MD;  Location: [facility];  Service: Gynecology;  Laterality: N/A;  . Salpingoophorectomy Bilateral 06/27/2014    Procedure: BILATERAL SALPINGO  OOPHORECTOMY/TUMOR DEBULKING/LYMPHNODE DISSECTION, OMENTECTOMY;  Surgeon: Everitt Amber, MD;  Location: [facility];  Service: Gynecology;  Laterality: Bilateral;   Family History  Problem Relation Age of Onset  . Diabetes Mother   . Hypertension Mother   . Arthritis Mother   . Heart disease Mother   . Heart failure Mother   . Other Mother     benign breast mass  . Stroke Father   . Heart disease Father   . Diabetes Paternal Grandmother   . Colon cancer Paternal Grandfather    History  Substance Use Topics  . Smoking status: Former Smoker -- 1.00 packs/day for 1 years  . Smokeless tobacco: Never Used  . Alcohol Use: No   OB History    Gravida Para Term Preterm AB TAB SAB Ectopic Multiple Living   2 2             Review  of Systems  Constitutional: Positive for fatigue. Negative for fever and chills.  Respiratory: Negative for cough, chest tightness and shortness of breath.   Cardiovascular: Positive for leg swelling. Negative for chest pain and palpitations.  Gastrointestinal: Negative for nausea, vomiting, abdominal pain and diarrhea.  Musculoskeletal: Positive for arthralgias. Negative for neck pain and neck stiffness.  Skin: Negative for rash.  Neurological: Positive for weakness. Negative for dizziness and headaches.  All other systems reviewed and are negative.     Allergies  Olmesartan medoxomil; Venlafaxine; Adhesive; and Tetracycline hcl  Home Medications   Prior to Admission medications   Medication Sig Start Date End Date Taking? Authorizing Provider  ALPRAZolam (XANAX XR) 1 MG 24 hr tablet TAKE 1 TABLET BY MOUTH TWICE DAILY 09/18/14   Chauncey Cruel, MD  buPROPion (WELLBUTRIN XL) 150 MG 24 hr tablet Take 1 tablet (150 mg total) by mouth daily. 09/15/14   Debbrah Alar, NP  cephALEXin (KEFLEX) 500 MG capsule Take 1 capsule (500 mg total) by mouth 3 (three) times daily. 09/25/14   Chauncey Cruel, MD  clonazePAM (KLONOPIN) 0.5 MG tablet TAKE 1/2 TABLET BY MOUTH TWICE DAILY AS NEEDED 09/18/14   Debbrah Alar, NP  digoxin (LANOXIN) 0.25 MG tablet Take 1 tablet (0.25 mg total) by mouth daily. Patient not taking: Reported on 09/15/2014 08/25/14   Debbrah Alar, NP  escitalopram (LEXAPRO) 20 MG tablet Take 20 mg by mouth daily.    Historical Provider, MD  famotidine (PEPCID) 20 MG tablet Take 1 tablet (20 mg total) by mouth daily. Patient taking differently: Take 20 mg by mouth daily as needed.  08/08/14   Modena Jansky, MD  furosemide (LASIX) 20 MG tablet Take 1 tablet (20 mg total) by mouth daily. 05/23/14   Debbrah Alar, NP  letrozole (FEMARA) 2.5 MG tablet Take 1 tablet (2.5 mg total) by mouth daily. 05/22/14   Chauncey Cruel, MD  levothyroxine (SYNTHROID, LEVOTHROID) 150  MCG tablet Take 1 tablet (150 mcg total) by mouth daily. 05/23/14   Debbrah Alar, NP  lidocaine-prilocaine (EMLA) cream Apply over port area 1-2 hours before chemotherapy 07/14/14   Chauncey Cruel, MD  metoprolol (LOPRESSOR) 50 MG tablet Take 1 tablet (50 mg total) by mouth 2 (two) times daily. 08/25/14   Debbrah Alar, NP  Nutritional Supplements (GLUCERNA 1.0 CAL) LIQD Take 1 Bottle by mouth 3 (three) times daily. 08/22/14   Debbrah Alar, NP  OLANZapine (ZYPREXA) 2.5 MG tablet Take 2.5 mg by mouth at bedtime.    Historical Provider, MD  ondansetron (ZOFRAN) 8 MG tablet Take 1  tablet (8 mg total) by mouth 2 (two) times daily. Start the day after chemo for 2 days. Then take as needed for nausea or vomiting. 09/25/14   Chauncey Cruel, MD  polyethylene glycol [facility] / GLYCOLAX) packet Take 17 g by mouth daily as needed for moderate constipation.    Historical Provider, MD  polyethylene glycol (MIRALAX / GLYCOLAX) packet Take 17 g by mouth daily. 09/04/14   Charlesetta Shanks, MD  potassium chloride (K-DUR) 10 MEQ tablet Take 10 mEq by mouth 2 (two) times daily. 04/18/14   Historical Provider, MD  prochlorperazine (COMPAZINE) 10 MG tablet Take 1 tablet (10 mg total) by mouth every 6 (six) hours as needed (Nausea or vomiting). 09/25/14   Chauncey Cruel, MD  saccharomyces boulardii (FLORASTOR) 250 MG capsule Take 1 capsule (250 mg total) by mouth 2 (two) times daily. 08/08/14   Modena Jansky, MD  warfarin (COUMADIN) 3 MG tablet Take 1 tablet (3 mg total) by mouth daily at 6 PM. Or as directed by coumadin clinic. Patient taking differently: Take 3-4.5 mg by mouth daily at 6 PM. Take 1 tablet (3 mg) on Sun, Tues, Thurs, Sat and Take 1.5 tablets (4.5 mg) on Mon, Wed, Fri. 08/08/14   Modena Jansky, MD   BP 121/55 mmHg  Pulse 75  Resp 22  Ht 5\' 4"  (1.626 m)  Wt 159 lb (72.122 kg)  BMI 27.28 kg/m2  SpO2 99% Physical Exam  Constitutional: She is oriented to person, place, and time. She  appears well-developed and well-nourished.  Ill appearing  HENT:  Head: Normocephalic.  Eyes: Conjunctivae are normal.  Neck: Neck supple.  Cardiovascular: Normal rate, regular rhythm and normal heart sounds.   Pulmonary/Chest: Effort normal and breath sounds normal. No respiratory distress. She has no wheezes. She has no rales.  Abdominal: Soft. Bowel sounds are normal. She exhibits no distension. There is no tenderness. There is no rebound.  Musculoskeletal:  Left LE edema from the foot up to the knee. Pitting 3+. Mild erythema and leg warm to the touch. DP pulses obtained with doppler  Neurological: She is alert and oriented to person, place, and time.  Skin: Skin is warm and dry.  Psychiatric: She has a normal mood and affect. Her behavior is normal.  Nursing note and vitals reviewed.   ED Course  Procedures (including critical care time) Labs Review Labs Reviewed  HEPARIN LEVEL (UNFRACTIONATED)    Imaging Review No results found.   EKG Interpretation None      ED ECG REPORT   Date: 09/25/2014  Rate: 78  Rhythm: normal sinus rhythm  QRS Axis: normal  Intervals: normal  ST/T Wave abnormalities: nonspecific T wave changes  Conduction Disutrbances:none  Narrative Interpretation:   Old EKG Reviewed: unchanged  I have personally reviewed the EKG tracing and agree with the computerized printout as noted.  MDM   Final diagnoses:  DVT (deep venous thrombosis), bilateral    Patient with acute bilateral DVTs, mobile clot in the right leg. Coumadin subtherapeutic at 1.5. Given multiple medical issues, new clots with mobile clot, will need admission. I discussed with tried hospitalist, they will admit. Patient requesting to be transferred to [facility]. His vital signs are normal at this time. She denies any chest pain or shortness of breath. Do not think she has a pulmonary embolism at this time. She has no other complaints. Heparin started.   Filed Vitals:   09/25/14  1615 09/25/14 1616 09/25/14 1630 09/25/14 1645  BP: 122/60  121/55 111/55  Pulse: 73  75 76  Resp: 22  22 22   Height:  5\' 4"  (1.626 m)    Weight:  159 lb (72.122 kg)    SpO2: 99%  99% 100%       Jeannett Senior, PA-C 09/26/14 0129  Daleen Bo, MD 09/27/14 [phone]

## 2014-09-25 NOTE — ED Notes (Signed)
Called [facility] for transport at [facility]

## 2014-09-25 NOTE — ED Notes (Signed)
Pt. Was sent to [name] after having her doppler to her bilateral legs.  She is positive for blood clots in both leg,   Lt. Leg is swollen and red .  Rt. Leg is mildly swollen.   Pain to both.

## 2014-09-25 NOTE — Progress Notes (Addendum)
ANTICOAGULATION CONSULT NOTE - Initial Consult  Pharmacy Consult for Heparin and Coumadin Indication: DVT  Allergies  Allergen Reactions  . Olmesartan Medoxomil Cough    REACTION: ? if cough  . Venlafaxine Other (See Comments)    REACTION: severe dry mouth  . Adhesive [Tape] Rash  . Tetracycline Hcl Other (See Comments)    Unknown reaction, too long for patient to remember     Patient Measurements: Height: 5\' 4"  (162.6 cm) Weight: 159 lb (72.122 kg) IBW/kg (Calculated) : 54.7 Heparin Dosing Weight: 69.5 kg  Vital Signs: Temp: 98 F (36.7 C) (05/16 1210) Temp Source: Oral (05/16 1210) BP: 128/62 mmHg (05/16 1545) Pulse Rate: 79 (05/16 1545)  Labs:  Recent Labs  09/25/14 1043 09/25/14 1043  HGB 12.0  --   HCT 36.9  --   PLT 138*  --   INR 1.50*  --   CREATININE  --  0.5*    Estimated Creatinine Clearance: 72.8 mL/min (by C-G formula based on Cr of 0.5).   Medical History: Past Medical History  Diagnosis Date  . Depression   . DVT (deep venous thrombosis)     hx of on HRT left leg ~2006  . GERD (gastroesophageal reflux disease)   . Hyperlipidemia   . Hypertension   . Hypothyroidism   . PPD positive, treated     rx inh   . Diabetes mellitus   . 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 DJD (degenerative joint disease) of lumbar spine   . Chronic combined systolic and diastolic CHF (congestive heart failure)     a. 08/2012 Echo: EF 55-60%, no rwma, mod MR;  b. 07/2014 Echo: EF 45-50%, distal antsept HK, mod TR/MR, mildly bil-atrial enlargement.  . Paroxysmal atrial fibrillation     a. CHA2DS2VASc = 4-->coumadin.  [name] Physical deconditioning   . Allergy   . Anemia     low iron hx  . Cataract     removed ou  . OSA on CPAP     cpap 4.5 setting  . Breast cancer 2014    a. 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 Clear Cell Ovarian  Cancer     a. 06/2014 s/p TAH/BSO debulking/lymph node dissection;  b. Now on chemo.    Medications:  Scheduled:  Infusions:   Assessment: 59 yo F undergoing treatment for ER positive breast cancer and clear cell ovarian cancer presenting to ED on 09/25/2014 with bilateral DVTs in lower extremities. Pharmacy has been consulted to dose heparin and Coumadin. Of note, patient has a history of DVTs and PAF for which she is on chronic warfarin therapy. INR on admission is SUBtherapeutic at 1.50. H/H wnl, plt low at 138 with no reported significant s/s bleeding.   Home warfarin: 4.5 mg daily except 3 mg MWF (recently increased to this dose on 5/9 for INR 1.8 based on [facility] visit notes)   Goal of Therapy:  Heparin level 0.3-0.7 units/ml Monitor platelets by anticoagulation protocol: Yes   Plan:  - Initiate heparin 4000 units x 1, followed by 1200 units/hr - 6 hour HL - Warfarin 4.5 mg PO x 1 tonight (higher than home dose 2/2 to SUBtherapeutic INR) - Daily HL/INR/CBC - Monitor for s/s bleeding - F/u plans for long-term AC (lovenox likely best option)  [name] K. Velva Harman, PharmD, [place] Clinical Pharmacist - Resident Pager: [phone] Pharmacy:  [phone] 09/25/2014 4:25 PM

## 2014-09-25 NOTE — Addendum Note (Signed)
Addended by: Laureen Abrahams on: 09/25/2014 03:44 PM   Modules accepted: Medications

## 2014-09-25 NOTE — ED Notes (Signed)
Report given to [facility] states it will be 1930  When they get here.

## 2014-09-25 NOTE — Telephone Encounter (Signed)
This RN contacted ER charge nurse- Melissa - at [facility] and informed her of pending pt arrival from vascular lab per report of bilateral DVTs with clot in Right iliac vein " mobile ".  Pt at high risk for PE.  MD request ER MD to assess above and probable need to be admitted to ICU for anticoagulation.  Pt's name and demographics given to [facility].

## 2014-09-25 NOTE — Progress Notes (Signed)
ID: [name]   DOB: 1955/10/19  MR#: [id number]  WVP#:[id number]  PCP: [name], NP GYN:  SU: [facility] OTHER NI:OEVOJJ [name], Shanon Ace, Roney Jaffe  CHIEF COMPLAINT:  Estrogen receptor positive Right Breast Cancer  CURRENT TREATMENT: Letrozole; abraxane   BREAST CANCER HISTORY: From the original consult note:  [name] noted a mass in her right breast mid December 2013, and as it did not spontaneously resolve over a couple of weeks she brought it to her primary physician's attention. She was set up for diagnostic mammography and right breast ultrasonography at the breast Center 05/10/2012. (Note that the patient's most recent prior mammography had been in October 2008). The current study showed a spiculated mass in the superior subareolar portion of the right breast measuring approximately 5 cm and associated with pleomorphic calcifications. This was firm and palpable. There was right nipple retraction and skin thickening. Ultrasound confirmed an irregularly marginated hypoechoic mass measuring 3.5 cm by ultrasound, and an abnormal appearing lower right axillary lymph node measuring 2.6 cm.  Biopsies of both the breast mass and the abnormal appearing lymph node were performed 05/21/2012. Both showed an invasive ductal carcinoma, grade 2, with similar prognostic panels (the breast mass was 100% estrogen and 73% progesterone receptor positive, with an MIB-1 of 5%; the lymph node was 100% estrogen 100% progesterone receptor positive, with an MIB-1 of 20%). Both masses were HER-2 negative.  Breast MRI obtained at [facility] imaging 05/29/2012 confirmed a dominant mass in the retroareolar right breast measuring 4.4 cm maximally. There was a satellite nodule inferior and lateral to this mass, measuring 1.7 cm. There were no other masses in either breast. Aside from the previously biopsied lymph node there were other mildly enhancing level I right  axillary lymph nodes which did not appear pathologic. There was no other lymphadenopathy noted.   The patient's subsequent history is as detailed below.  OVARIAN CANCER HISTORY: From the 06/16/2014 summary note:  "[name] returns today for review of her restaging studies accompanied by her husband [name]. To summarize her recent history: She was admitted to [facility] with a diagnosis of possible pneumonia in late December 2015. Chest x-ray showed some increased density in the right upper lobe. CT scan was suggested and was obtained by Dr. Inda Castle. This showed 2 small nodules in the right middle lobe, measuring 8 and 4 mm respectively. Dr. Inda Castle then contacted [name] for further evaluation and we set [name] up for a PET scan and which was performed late January. This showed the 2 nodules in the lung not to be hypermetabolic. This of course does not prove that they are not malignant. More importantly, there was a 13.7 cm cystic/solid mass in the upper pelvis associated with adenopathy,. All of this was hypermetabolic."  The patient was evaluated by gynecologic oncology and on 06/27/2014 she underwent surgery under Dr. Denman George. This consisted of an exploratory laparotomy with hysterectomy and abdominal salpingo-oophorectomy, as well as tumor debulking and omentectomy. 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 The tumor measured 11.5 cm, focally involve the ovarian capsule on the left; the uterus and right ovary and fallopian tube were unremarkable. One para-aortic lymph node was positive out of a total of 6 lymph nodes sampled (2 left common iliac, 2 left para-aortic, and to within the omental resection).  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 Since her last visit here she  underwent admission for what proved to be Klebsiella sepsis. This required an intensive care unit stay. She is slowly recovering from this problem. She continues on Coumadin, currently on 5 mg daily alternating with 7-1/2 mg daily. She is here to discuss resumption of her chemotherapy  REVIEW OF SYSTEMS: [name] started developing left lower extremity swelling 09/21/2014. She did not call to reported, since she "already had an appointment here". She feels the leg is hot in addition to swollen. She denies a catch in the breath, worsening shortness of breath, cough, or pleurisy. There has been no hemoptysis. Her sugars are "okay". There has been no bleeding on Coumadin and in fact her INR is subtherapeutic currently. She denies severe constipation or abdominal cramps or any change in bladder habits. She feels she is eating well, and is ready to start back on "some kind of chemotherapy". She admits to anxiety and depression. She is trying to exercise by walking. A detailed review of systems today was otherwise stable  PAST MEDICAL HISTORY: Past Medical History  Diagnosis Date  . Depression   . DVT (deep venous thrombosis)     hx of on HRT left leg ~2006  . GERD (gastroesophageal reflux disease)   . Hyperlipidemia   . Hypertension   . Hypothyroidism   . PPD positive, treated     rx inh   . Diabetes mellitus   . 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 DJD (degenerative joint disease) of lumbar spine   . Chronic combined systolic and diastolic CHF (congestive heart failure)     a. 08/2012 Echo: EF 55-60%, no rwma, mod MR;  b. 07/2014 Echo: EF 45-50%, distal antsept HK, mod TR/MR, mildly bil-atrial enlargement.  . Paroxysmal atrial fibrillation     a. CHA2DS2VASc = 4-->coumadin.  [name] Physical deconditioning   . Allergy   . Anemia     low iron hx  . Cataract     removed ou  . OSA on CPAP     cpap 4.5 setting  . Breast cancer 2014    a. Right - invasive ductal carcinoma with  2/18 lymph nodes involved (pT3, pN1a, stage IIIA), s/p R mastectomy 06/08/12, chemo (not well tolerated->d/c)  . Clear Cell Ovarian Cancer     a. 06/2014 s/p TAH/BSO debulking/lymph node dissection;  b. Now on chemo.    PAST SURGICAL HISTORY: Past Surgical History  Procedure Laterality Date  . Tubal ligation    . Cholecystectomy    . Foot surgery    . Eye surgery    . Cataract extraction    . Percutaneous liver biopsy    . Breast biopsy      left breast  . Mastectomy modified radical  06/08/2012    Procedure: MASTECTOMY MODIFIED RADICAL;  Surgeon: Edward Jolly, MD;  Location: [place];  Service: General;  Laterality: Right;  . Portacath placement  06/08/2012    Procedure: INSERTION PORT-A-CATH;  Surgeon: Edward Jolly, MD;  Location: [place];  Service: General;  Laterality: Left;  . Peg tube placement      peg removed 2015  . History of chemotherapy x 2 treatments, radiation tx  2014  . Pac removed  2015  . Laparotomy N/A 06/27/2014    Procedure: EXPLORATORY LAPAROTOMY;  Surgeon: Everitt Amber, MD;  Location: [facility];  Service: Gynecology;  Laterality:  N/A;  . Abdominal hysterectomy N/A 06/27/2014    Procedure: HYSTERECTOMY ABDOMINAL TOTAL;  Surgeon: Everitt Amber, MD;  Location: [facility];  Service: Gynecology;  Laterality: N/A;  . Salpingoophorectomy Bilateral 06/27/2014    Procedure: BILATERAL SALPINGO OOPHORECTOMY/TUMOR DEBULKING/LYMPHNODE DISSECTION, OMENTECTOMY;  Surgeon: Everitt Amber, MD;  Location: [facility];  Service: Gynecology;  Laterality: Bilateral;    FAMILY HISTORY Family History  Problem Relation Age of Onset  . Diabetes Mother   . Hypertension Mother   . Arthritis Mother   . Heart disease Mother   . Heart failure Mother   . Other Mother     benign breast mass  . Stroke Father   . Heart disease Father   . Diabetes Paternal Grandmother   . Colon cancer Paternal Grandfather    the patient's father died in his 36s with a history of dementia. He had had prior strokes. The  patient's mother died in her 9s, with a history of congestive heart failure. Ashelyn had no brothers, one sister. There is no history of breast or ovarian cancer in the family.  GYNECOLOGIC HISTORY: Menarche age 7, first live birth age 58, she is GX P2, menopause approximately 15 years ago, on hormone replacement until 2010.  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 Her husband [name] works for [employer]. Daughter Paul Half is a Physiological scientist and lives in [place]. Daughter Santiago Bur and her family (husband and 2 children aged 76 and 3 years) currently live with the patient. Janaisha is a member of a [employer].   ADVANCED DIRECTIVES: Not in place  HEALTH MAINTENANCE: (Updated October 2014) History  Substance Use Topics  . Smoking status: Former Smoker -- 1.00 packs/day for 1 years  . Smokeless tobacco: Never Used  . Alcohol Use: No     Colonoscopy: Never  PAP: Does not recall  Bone density: Never  Lipid panel:   Allergies  Allergen Reactions  . Olmesartan Medoxomil Cough    REACTION: ? if cough  . Venlafaxine Other (See Comments)    REACTION: severe dry mouth  . Adhesive [Tape] Rash  . Tetracycline Hcl Other (See Comments)    Unknown reaction, too long for patient to remember     Current Outpatient Prescriptions  Medication Sig Dispense Refill  . ALPRAZolam (XANAX XR) 1 MG 24 hr tablet TAKE 1 TABLET BY MOUTH TWICE DAILY 60 tablet 0  . buPROPion (WELLBUTRIN XL) 150 MG 24 hr tablet Take 1 tablet (150 mg total) by mouth daily. 30 tablet 1  . clonazePAM (KLONOPIN) 0.5 MG tablet TAKE 1/2 TABLET BY MOUTH TWICE DAILY AS NEEDED 30 tablet 0  . digoxin (LANOXIN) 0.25 MG tablet Take 1 tablet (0.25 mg total) by mouth daily. (Patient not taking: Reported on 09/15/2014) 90 tablet 1  . escitalopram (LEXAPRO) 20 MG tablet Take 20 mg by mouth daily.    .  famotidine (PEPCID) 20 MG tablet Take 1 tablet (20 mg total) by mouth daily. (Patient taking differently: Take 20 mg by mouth daily as needed. ) 30 tablet 0  . furosemide (LASIX) 20 MG tablet Take 1 tablet (20 mg total) by mouth daily. 90 tablet 1  . letrozole (FEMARA) 2.5 MG tablet Take 1 tablet (2.5 mg total) by mouth daily. 90 tablet 0  . levothyroxine (SYNTHROID, LEVOTHROID) 150 MCG tablet Take 1 tablet (150 mcg total) by mouth daily. 90 tablet 1  .  lidocaine-prilocaine (EMLA) cream Apply over port area 1-2 hours before chemotherapy 30 g 0  . metoprolol (LOPRESSOR) 50 MG tablet Take 1 tablet (50 mg total) by mouth 2 (two) times daily. 180 tablet 1  . Nutritional Supplements (GLUCERNA 1.0 CAL) LIQD Take 1 Bottle by mouth 3 (three) times daily. 90 Bottle 0  . OLANZapine (ZYPREXA) 2.5 MG tablet Take 2.5 mg by mouth at bedtime.    . polyethylene glycol (MIRALAX / GLYCOLAX) packet Take 17 g by mouth daily as needed for moderate constipation.    . polyethylene glycol (MIRALAX / GLYCOLAX) packet Take 17 g by mouth daily. 14 each 0  . potassium chloride (K-DUR) 10 MEQ tablet Take 10 mEq by mouth 2 (two) times daily.  3  . saccharomyces boulardii (FLORASTOR) 250 MG capsule Take 1 capsule (250 mg total) by mouth 2 (two) times daily. 30 capsule 0  . warfarin (COUMADIN) 3 MG tablet Take 1 tablet (3 mg total) by mouth daily at 6 PM. Or as directed by coumadin clinic. (Patient taking differently: Take 3-4.5 mg by mouth daily at 6 PM. Take 1 tablet (3 mg) on Sun, Tues, Thurs, Sat and Take 1.5 tablets (4.5 mg) on Mon, Wed, Fri.)     No current facility-administered medications for this visit.   Facility-Administered Medications Ordered in Other Visits  Medication Dose Route Frequency Provider Last Rate Last Dose  . sodium chloride 0.9 % injection 10 mL  10 mL Intracatheter PRN Chauncey Cruel, MD   10 mL at 07/24/14 1523    OBJECTIVE: Middle-aged white woman who appears stated age  59 Vitals:    09/25/14 1210  BP: 133/81  Pulse: 76  Temp: 98 F (36.7 C)  Resp: 18     Body mass index is 27.31 kg/(m^2).    ECOG FS: 2 Filed Weights   09/25/14 1210  Weight: 159 lb 3.2 oz (72.213 kg)   Sclerae unicteric,pupils round and equal  Oropharynx clear, slightly dry  No cervical or supraclavicular adenopathy Lungs no rales or rhonchi,no rubs noted  Heart regular rate and rhythm Abd soft, obese, positive bowel sounds, no masses palpated  MSK no focal spinal tenderness, bilateral lower extremity lymphedema  Neuro: nonfocal, Well-oriented, appropriate affect  Breasts: Deferred   LAB RESULTS: Lab Results  Component Value Date   WBC 5.2 09/25/2014   NEUTROABS 3.9 09/25/2014   HGB 12.0 09/25/2014   HCT 36.9 09/25/2014   MCV 96.9 09/25/2014   PLT 138* 09/25/2014      Chemistry      Component Value Date/Time   NA 141 09/25/2014 1043   NA 136 08/18/2014 1320   K 3.7 09/25/2014 1043   K 4.0 08/18/2014 1320   CL 103 08/18/2014 1320   CL 104 10/29/2012 1058   CO2 21* 09/25/2014 1043   CO2 24 08/18/2014 1320   BUN 7.8 09/25/2014 1043   BUN 5* 08/18/2014 1320   CREATININE 0.5* 09/25/2014 1043   CREATININE 0.68 08/18/2014 1320   CREATININE 0.69 11/10/2013 0828      Component Value Date/Time   CALCIUM 8.3* 09/25/2014 1043   CALCIUM 7.9* 08/18/2014 1320   ALKPHOS 130 09/25/2014 1043   ALKPHOS 152* 08/18/2014 1320   AST 31 09/25/2014 1043   AST 40* 08/18/2014 1320   ALT 15 09/25/2014 1043   ALT 20 08/18/2014 1320   BILITOT 1.06 09/25/2014 1043   BILITOT 0.8 08/18/2014 1320      STUDIES: No results found.  ASSESSMENT: 59 y.o. [name]  woman   (1)  status post right mastectomy  06/08/2012 for a pT3, pN1a, stage IIIA invasive ductal carcinoma, grade 2,  estrogen and progesterone receptor positive, HER-2/neu negative, with an MIB-1 of 20%.    (2)  treated in the adjuvant setting with docetaxel/ cyclophosphamide given every 3 weeks. There were multiple and severe  complications, and the  patient tolerated only two cycles, last dose 07/29/2012  (3) letrozole started 08/16/2012  (a) osteopenia, with a T score of -1.14 April 2013  (4) postmastectomy radiation completed 12/24/2012  (5)  comorbidities include diabetes, hypertension, chronic liver disease with cirrhosis and fatty liver, and nutritional disturbance with temporarily dependence on PEG feeds (discontinued July 2014).  (6) restaging studies January 2016 show  (a) 8 mm and 4 mm Right middle lobe lung nodules, not metabolically active on PET -- will require follow-up  (b) hypermetabolic 70.3 cm mixed solid/cystic lesion in upper pelvis, with associated adenopathy  (7) status post exploratory laparotomy 06/27/2014 with total abdominal hysterectomy, bilateral salpingo-oophorectomy, para-aortic lymphadenectomy, omentectomy and radical tumor debulking for a clear cell ovarian cancer, pT1c pN1, stage IIIC .  (8) adjuvant chemotherapy consisting of carboplatin and paclitaxel given weekly days 1 and 8 of each 21 day cycle, for 6-8 cycles as tolerated, started 07/17/2014, with onpro support  (a) day 8 cycle 2 omitted and further treatment delayed because of an episode of Klebsiella sepsis requiring intensive care unit admission  (9) starting abraxane 09/29/2014, to be repeated every 14 days   PLAN: [name] has sufficiently recovered from her sepsis episode that I think we can consider resuming treatment. We are going to do Abraxane alone to start with, every 2 weeks. We will be following the CEA 125. If this brings that down, we will simply continue as tolerated. Otherwise we will add [place] starting at very low doses.  Her CEA 125 needs to be taken with a gr of salt, because some of the early rise may have been due to surgery and the subsequent decline simply to healing. On the other hand the rise may have represented active disease and the decline response to chemotherapy. We will lead to obtain further  determinations before we can decide this clearly one way or the other.  I'm concerned that she may have developed a left lower extremity DVT. Her INR today is only 1.5. I have instructed her to increase Coumadin to 7.5 mg daily and we will recheck that on Friday. However I am also sending her for Doppler ultrasound of the left lower extremity today  She has a good understanding of the possible toxicities, side effects and complications of Abraxane. One big advantage of this drug as far as [name]'s concern is that it will not interact with her diabetes and because we do not need to use steroids in the supportive metastases her immune system should not be compromised.   [name] has a good understanding of this plan. She agrees with it.  She will call with any problems that may develop before her next visit here.   Chauncey Cruel, MD      09/25/2014

## 2014-09-25 NOTE — Telephone Encounter (Signed)
Per staff message and POF I have scheduled appts. Advised scheduler of appts. JMW  

## 2014-09-25 NOTE — Progress Notes (Signed)
COURTESY NOTE:  [name] was seen in the office earlier today (see separate note) and found to have LLE swelling. She was send for dopplers which showed bilateral DVTs and in particular unstable clot on the Right. She is being admitted for IV heparin and observation. I do not believe she will need an IV filter as her coumadin was subtherapeutic.   Her oncologic history is summarized below:   59 y.o. [name]   (1) status post right mastectomy 06/08/2012 for a pT3, pN1a, stage IIIA invasive ductal carcinoma, grade 2, estrogen and progesterone receptor positive, HER-2/neu negative, with an MIB-1 of 20%.   (2) treated in the adjuvant setting with docetaxel/ cyclophosphamide given every 3 weeks. There were multiple and severe complications, and the patient tolerated only two cycles, last dose 07/29/2012  (3) letrozole started 08/16/2012 (a) osteopenia, with a T score of -1.14 April 2013  (4) postmastectomy radiation completed 12/24/2012  (5) comorbidities include diabetes, hypertension, chronic liver disease with cirrhosis and fatty liver, and nutritional disturbance with temporarily dependence on PEG feeds (discontinued July 2014).  (6) restaging studies January 2016 show (a) 8 mm and 4 mm Right middle lobe lung nodules, not metabolically active on PET -- will require follow-up (b) hypermetabolic 32.0 cm mixed solid/cystic lesion in upper pelvis, with associated adenopathy  (7) status post exploratory laparotomy 06/27/2014 with total abdominal hysterectomy, bilateral salpingo-oophorectomy, para-aortic lymphadenectomy, omentectomy and radical tumor debulking for a clear cell ovarian cancer, pT1c pN1, stage IIIC .  (8) adjuvant chemotherapy consisting of carboplatin and paclitaxel given weekly days 1 and 8 of each 21 day cycle, for 6-8 cycles as tolerated, started 07/17/2014, with onpro support (a) day 8 cycle 2 omitted and further  treatment delayed because of an episode of Klebsiella sepsis requiring intensive care unit admission  (9) starting abraxane 09/29/2014, to be repeated every 14 days  Greatly appreciate the hospitalist service's help to this patient!

## 2014-09-25 NOTE — ED Notes (Signed)
Pa  at bedside. 

## 2014-09-25 NOTE — ED Notes (Signed)
Service response contacted advised patient has not received meal Tray. Stated they have reprinted tickect.

## 2014-09-25 NOTE — Progress Notes (Signed)
VASCULAR LAB PRELIMINARY  PRELIMINARY  PRELIMINARY  PRELIMINARY  Bilateral lower extremity venous duplex completed.    Preliminary report:  Right: Positive for  DVT noted  In the popliteal, femoral, and distal common femoral to the saphenofemoral junction. The DVT in the distal to mid common femoral is mobile. There is no evidence of a superficial thrombus or Baker's cyst. Left - Positive for an occlusive DVT coursing from the distal popliteal vein through the femoral, profunda , and common femoral veins. Also noted is a superficial thrombosis of the greater saphenous vein coursing from the mid thigh through the saphenofemoral junction. There is no evidence of a Baker's cyst  [name], RVS 09/25/2014, 3:32 PM

## 2014-09-25 NOTE — H&P (Signed)
History and Physical  [name] QMG:[id number] DOB: 1956/04/04 DOA: 09/25/2014   PCP: [name], NP  Referring Physician: ED/ Marc Morgans, PA-C  Chief Complaint: leg pain  HPI:  59 year old female with a history of NASH liver cirrhosis,  paroxysmal atrial fibrillation, chronic systolic CHF, right-sided breast cancer, and ovarian cancer presented to her oncologist office today for edema and pain about her left lower extremity. The patient stated that she began having swelling, erythema, and pain that began on 09/21/2014. She felt there was not an emergency, and since she had an appointment with her oncologist on 09/25/2014, she decided to wait until then. Recommended not was concerned about DVT and ordered a venous duplex of her lower extremities. The results showed a positive DVT in the bilateral lower extremities.  Patient denies fevers, chills, headache, chest pain, dyspnea, coughing, hemoptysis, nausea, vomiting, diarrhea, abdominal pain, dysuria, hematuria, hematochezia, melena. Because of the severity of the DVT in the left lower extremity and because of the patient's subtherapeutic INR, a decision was made to admit the patient for hospital for anticoagulation. Labs obtained at the oncology office revealed INR 1.50. BMP and CBC were essentially unremarkable except for platelets 138,000. Hepatic enzymes were unremarkable. EKG shows sinus rhythm with nonspecific T-wave changes which were unchanged from previous EKGs. The patient was afebrile and hemodynamically stable without saturation 100% on room air. After discussion with the patient's medical oncologist and the patient and family, it was thought to be in the patient's best interest to be transferred to [facility] stepdown unit.  Assessment/Plan: Acute bilateral lower extremity DVT -In the setting of subtherapeutic INR and malignancy -Patient endorsed compliance with warfarin -Start intravenous heparin  bridging -Continue warfarin -No indication for IVC filter at this time -Case was discussed with the patient's medical oncologist, Dr. Jana Hakim, who will follow the patient at [facility] -Oxycodone when necessary pain Paroxysmal atrial fibrillation -Presently sinus rhythm -CAHDS-VASc=4 -Continue Coumadin -Heparin bridging as discussed above -Continue metoprolol tartrate Ovarian carcinoma  -Status post exposure laparotomy with hysterectomy, sipping oophorectomy, and debulking 06/27/2014--Dr. 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 Depression   . DVT (deep venous thrombosis)     hx of on HRT left leg ~2006  . GERD (gastroesophageal reflux disease)   . Hyperlipidemia   . Hypertension   . Hypothyroidism   . PPD positive, treated     rx inh   . Diabetes mellitus   . 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 DJD (degenerative joint disease) of lumbar spine   . Chronic combined systolic and diastolic CHF (congestive heart failure)     a. 08/2012 Echo: EF 55-60%, no rwma, mod MR;  b. 07/2014 Echo: EF 45-50%, distal antsept HK, mod TR/MR, mildly bil-atrial enlargement.  . Paroxysmal atrial fibrillation     a. CHA2DS2VASc = 4-->coumadin.  [name] Physical deconditioning   . Allergy   . Anemia     low iron hx  . Cataract     removed ou  . OSA on CPAP     cpap 4.5 setting  .  Breast cancer 2014    a. 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 Clear Cell Ovarian Cancer     a. 06/2014 s/p TAH/BSO debulking/lymph node dissection;  b. Now on chemo.   Past Surgical History  Procedure Laterality Date  . Tubal ligation    . Cholecystectomy    . Foot surgery    . Eye surgery    . Cataract extraction    . Percutaneous liver biopsy    . Breast biopsy      left breast  . Mastectomy modified radical  06/08/2012    Procedure: MASTECTOMY MODIFIED RADICAL;  Surgeon: Edward Jolly, MD;  Location: [place];  Service: General;  Laterality: Right;  . Portacath placement  06/08/2012    Procedure: INSERTION PORT-A-CATH;  Surgeon: Edward Jolly, MD;  Location: [place];  Service: General;  Laterality: Left;  . Peg tube placement      peg removed 2015  . History of chemotherapy x 2 treatments, radiation tx  2014  . Pac removed  2015  . Laparotomy N/A 06/27/2014    Procedure: EXPLORATORY LAPAROTOMY;  Surgeon: Everitt Amber, MD;  Location: [facility];  Service: Gynecology;  Laterality: N/A;  . Abdominal hysterectomy N/A 06/27/2014    Procedure: HYSTERECTOMY ABDOMINAL TOTAL;  Surgeon: Everitt Amber, MD;  Location: [facility];  Service: Gynecology;  Laterality: N/A;  . Salpingoophorectomy Bilateral 06/27/2014    Procedure: BILATERAL SALPINGO OOPHORECTOMY/TUMOR DEBULKING/LYMPHNODE DISSECTION, OMENTECTOMY;  Surgeon: Everitt Amber, MD;  Location: [facility];  Service: Gynecology;  Laterality: Bilateral;   Social History:  reports that she has quit smoking. She has never used smokeless tobacco. She reports that she does not drink alcohol or use illicit drugs.   Family History  Problem Relation Age of Onset  . Diabetes Mother   . Hypertension Mother   . Arthritis Mother   . Heart disease Mother   . Heart failure Mother   . Other Mother     benign breast mass  . Stroke Father   . Heart disease Father   . Diabetes Paternal Grandmother   . Colon cancer Paternal Grandfather      Allergies  Allergen Reactions  .  Olmesartan Medoxomil Cough    REACTION: ? if cough  . Venlafaxine Other (See Comments)    REACTION: severe dry mouth  . Adhesive [Tape] Rash  . Tetracycline Hcl Other (See Comments)    Unknown reaction, too long for patient to remember       Prior to Admission medications   Medication Sig Start Date End Date Taking? Authorizing Provider  ALPRAZolam (XANAX XR) 1 MG 24 hr tablet TAKE 1 TABLET BY MOUTH TWICE DAILY 09/18/14  Yes Chauncey Cruel, MD  buPROPion (WELLBUTRIN XL) 150 MG 24 hr tablet Take 1 tablet (150 mg total) by mouth daily. 09/15/14   Debbrah Alar, NP  cephALEXin (KEFLEX) 500 MG capsule Take 1 capsule (500 mg total) by mouth 3 (three) times daily. 09/25/14   Chauncey Cruel, MD  clonazePAM (KLONOPIN) 0.5 MG tablet TAKE 1/2 TABLET BY MOUTH TWICE DAILY AS NEEDED 09/18/14   Debbrah Alar, NP  digoxin (LANOXIN) 0.25 MG tablet Take 1 tablet (0.25 mg total) by mouth daily. Patient not taking: Reported on 09/15/2014 08/25/14   Debbrah Alar, NP  escitalopram (LEXAPRO) 20 MG tablet Take 20 mg by mouth daily.    Historical Provider, MD  famotidine (  PEPCID) 20 MG tablet Take 1 tablet (20 mg total) by mouth daily. Patient taking differently: Take 20 mg by mouth daily as needed.  08/08/14   Modena Jansky, MD  furosemide (LASIX) 20 MG tablet Take 1 tablet (20 mg total) by mouth daily. 05/23/14   Debbrah Alar, NP  letrozole (FEMARA) 2.5 MG tablet Take 1 tablet (2.5 mg total) by mouth daily. 05/22/14   Chauncey Cruel, MD  levothyroxine (SYNTHROID, LEVOTHROID) 150 MCG tablet Take 1 tablet (150 mcg total) by mouth daily. 05/23/14   Debbrah Alar, NP  lidocaine-prilocaine (EMLA) cream Apply over port area 1-2 hours before chemotherapy 07/14/14   Chauncey Cruel, MD  metoprolol (LOPRESSOR) 50 MG tablet Take 1 tablet (50 mg total) by mouth 2 (two) times daily. 08/25/14   Debbrah Alar, NP  Nutritional Supplements (GLUCERNA 1.0 CAL) LIQD Take 1 Bottle by mouth 3 (three)  times daily. 08/22/14   Debbrah Alar, NP  OLANZapine (ZYPREXA) 2.5 MG tablet Take 2.5 mg by mouth at bedtime.    Historical Provider, MD  ondansetron (ZOFRAN) 8 MG tablet Take 1 tablet (8 mg total) by mouth 2 (two) times daily. Start the day after chemo for 2 days. Then take as needed for nausea or vomiting. 09/25/14   Chauncey Cruel, MD  polyethylene glycol [facility] / GLYCOLAX) packet Take 17 g by mouth daily as needed for moderate constipation.    Historical Provider, MD  polyethylene glycol (MIRALAX / GLYCOLAX) packet Take 17 g by mouth daily. 09/04/14   Charlesetta Shanks, MD  potassium chloride (K-DUR) 10 MEQ tablet Take 10 mEq by mouth 2 (two) times daily. 04/18/14   Historical Provider, MD  prochlorperazine (COMPAZINE) 10 MG tablet Take 1 tablet (10 mg total) by mouth every 6 (six) hours as needed (Nausea or vomiting). 09/25/14   Chauncey Cruel, MD  saccharomyces boulardii (FLORASTOR) 250 MG capsule Take 1 capsule (250 mg total) by mouth 2 (two) times daily. 08/08/14   Modena Jansky, MD  warfarin (COUMADIN) 3 MG tablet Take 1 tablet (3 mg total) by mouth daily at 6 PM. Or as directed by coumadin clinic. Patient taking differently: Take 3-4.5 mg by mouth daily at 6 PM. Take 1 tablet (3 mg) on Sun, Tues, Thurs, Sat and Take 1.5 tablets (4.5 mg) on Mon, Wed, Fri. 08/08/14   Modena Jansky, MD    Review of Systems:  Constitutional:  No weight loss, night sweats, Fevers, chills, fatigue.  Head&Eyes: No headache.  No vision loss.  No eye pain or scotoma ENT:  No Difficulty swallowing,Tooth/dental problems,Sore throat,   Cardio-vascular:  No chest pain, Orthopnea, PND, swelling in lower extremities,  dizziness, palpitations  GI:  No  abdominal pain, nausea, vomiting, diarrhea, loss of appetite, hematochezia, melena, heartburn, indigestion, Resp:  No shortness of breath with exertion or at rest. No cough. No coughing up of blood .No wheezing.No chest wall deformity  Skin:  no rash or  lesions.  GU:  no dysuria, change in color of urine, no urgency or frequency. No flank pain.  Musculoskeletal:  No joint pain or swelling. No decreased range of motion. No back pain.  Psych:  No change in mood or affect. Neurologic: No headache, no dysesthesia, no focal weakness, no vision loss. No syncope  Physical Exam: Filed Vitals:   09/25/14 1700 09/25/14 1715 09/25/14 1730 09/25/14 1745  BP: 117/57 117/56 123/57 126/51  Pulse: 78 77 77 78  Resp: 25 16 26 26   Height:  Weight:      SpO2: 99% 100% 98% 99%   General:  A&O x 3, NAD, nontoxic, pleasant/cooperative Head/Eye: No conjunctival hemorrhage, no icterus, [place]/AT, No nystagmus ENT:  No icterus,  No thrush, good dentition, no pharyngeal exudate Neck:  No masses, no lymphadenpathy, no bruits CV:  RRR, no rub, no gallop, no S3 Lung:  CTAB, good air movement, no wheeze, no rhonchi Abdomen: soft/NT, +BS, nondistended, no peritoneal signs Ext: No cyanosis, No rashes, No petechiae, No lymphangitis, 2+RLE edema; 3+LLE edema   Labs on Admission:  Basic Metabolic Panel:  Recent Labs Lab 09/25/14 1043  NA 141  K 3.7  CO2 21*  GLUCOSE 83  BUN 7.8  CREATININE 0.5*  CALCIUM 8.3*   Liver Function Tests:  Recent Labs Lab 09/25/14 1043  AST 31  ALT 15  ALKPHOS 130  BILITOT 1.06  PROT 6.7  ALBUMIN 2.7*   No results for input(s): LIPASE, AMYLASE in the last 168 hours. No results for input(s): AMMONIA in the last 168 hours. CBC:  Recent Labs Lab 09/25/14 1043  WBC 5.2  NEUTROABS 3.9  HGB 12.0  HCT 36.9  MCV 96.9  PLT 138*   Cardiac Enzymes: No results for input(s): CKTOTAL, CKMB, CKMBINDEX, TROPONINI in the last 168 hours. BNP: Invalid input(s): POCBNP CBG: No results for input(s): GLUCAP in the last 168 hours.  Radiological Exams on Admission: No results found.  EKG: Independently reviewed. Sinus rhythm, T-wave inversion in III and aVF    Time spent:60 minutes Code Status:   FULL Family  Communication:   Husband at bedside   Alp Goldwater, DO  [facility] Pager [phone]  If 7PM-7AM, please contact night-coverage [email or web address] Password [facility] 09/25/2014, 6:05 PM

## 2014-09-26 ENCOUNTER — Encounter: Payer: Self-pay | Admitting: *Deleted

## 2014-09-26 DIAGNOSIS — I82403 Acute embolism and thrombosis of unspecified deep veins of lower extremity, bilateral: Secondary | ICD-10-CM

## 2014-09-26 DIAGNOSIS — Z853 Personal history of malignant neoplasm of breast: Secondary | ICD-10-CM

## 2014-09-26 DIAGNOSIS — C569 Malignant neoplasm of unspecified ovary: Secondary | ICD-10-CM

## 2014-09-26 LAB — PROTIME-INR
INR: 1.77 — AB (ref 0.00–1.49)
Prothrombin Time: 20.8 seconds — ABNORMAL HIGH (ref 11.6–15.2)

## 2014-09-26 LAB — BASIC METABOLIC PANEL
Anion gap: 6 (ref 5–15)
BUN: 8 mg/dL (ref 6–20)
CO2: 23 mmol/L (ref 22–32)
Calcium: 7.8 mg/dL — ABNORMAL LOW (ref 8.9–10.3)
Chloride: 108 mmol/L (ref 101–111)
Creatinine, Ser: 0.35 mg/dL — ABNORMAL LOW (ref 0.44–1.00)
GFR calc Af Amer: >60 mL/min (ref >60)
GFR calc non Af Amer: >60 mL/min (ref >60)
GLUCOSE: 89 mg/dL (ref 65–99)
POTASSIUM: 3 mmol/L — AB (ref 3.5–5.1)
SODIUM: 137 mmol/L (ref 135–145)

## 2014-09-26 LAB — CBC
HCT: 33 % — ABNORMAL LOW (ref 36.0–46.0)
Hemoglobin: 10.7 g/dL — ABNORMAL LOW (ref 12.0–15.0)
MCH: 31.5 pg (ref 26.0–34.0)
MCHC: 32.4 g/dL (ref 30.0–36.0)
MCV: 97.1 fL (ref 78.0–100.0)
PLATELETS: 128 10*3/uL — AB (ref 150–400)
RBC: 3.4 MIL/uL — AB (ref 3.87–5.11)
RDW: 17 % — AB (ref 11.5–15.5)
WBC: 4.3 10*3/uL (ref 4.0–10.5)

## 2014-09-26 LAB — HEPARIN LEVEL (UNFRACTIONATED)
HEPARIN UNFRACTIONATED: 0.21 [IU]/mL — AB (ref 0.30–0.70)
Heparin Unfractionated: 0.31 IU/mL (ref 0.30–0.70)
Heparin Unfractionated: 0.35 IU/mL (ref 0.30–0.70)

## 2014-09-26 MED ORDER — WARFARIN SODIUM 4 MG PO TABS
4.5000 mg | ORAL_TABLET | Freq: Once | ORAL | Status: AC
  Administered 2014-09-26: 4.5 mg via ORAL
  Filled 2014-09-26: qty 1

## 2014-09-26 MED ORDER — HEPARIN (PORCINE) IN NACL 100-0.45 UNIT/ML-% IJ SOLN
1350.0000 [IU]/h | INTRAMUSCULAR | Status: DC
  Administered 2014-09-26 – 2014-09-27 (×2): 1350 [IU]/h via INTRAVENOUS
  Filled 2014-09-26 (×4): qty 250

## 2014-09-26 MED ORDER — PNEUMOCOCCAL VAC POLYVALENT 25 MCG/0.5ML IJ INJ
0.5000 mL | INJECTION | INTRAMUSCULAR | Status: DC
  Filled 2014-09-26 (×2): qty 0.5

## 2014-09-26 MED ORDER — POTASSIUM CHLORIDE CRYS ER 20 MEQ PO TBCR
40.0000 meq | EXTENDED_RELEASE_TABLET | Freq: Once | ORAL | Status: AC
  Administered 2014-09-26: 40 meq via ORAL
  Filled 2014-09-26: qty 2

## 2014-09-26 MED ORDER — ENSURE ENLIVE PO LIQD
237.0000 mL | Freq: Two times a day (BID) | ORAL | Status: DC
  Administered 2014-09-26 – 2014-10-02 (×11): 237 mL via ORAL

## 2014-09-26 NOTE — Progress Notes (Signed)
Patient ID: [name], female   DOB: May 21, 1955, 59 y.o.   MRN: [id number]  [facility] PROGRESS NOTE  DINORAH MASULLO YTK:[id number] DOB: 05/26/1955 DOA: 09/25/2014 PCP: [name], NP   Brief narrative:    59 year old female with a history of NASH liver cirrhosis, paroxysmal atrial fibrillation, chronic systolic CHF, right-sided breast cancer, and ovarian cancer presented to her oncologist office today for edema and pain about her left lower extremity. The patient stated that she began having swelling, erythema, and pain that began on 09/21/2014. She felt there was not an emergency, and since she had an appointment with her oncologist on 09/25/2014, she decided to wait until then. Concern was for DVT and venous duplex of her lower extremities was ordered. The results showed a positive DVT in the bilateral lower extremities. Patient denied fevers, chills, headache, chest pain, dyspnea, coughing, hemoptysis, nausea, vomiting, diarrhea, abdominal pain, dysuria, hematuria, hematochezia, melena. Because of the severity of the DVT in the left lower extremity and because of the patient's subtherapeutic INR, a decision was made to admit the patient for hospital for anticoagulation. Labs obtained at the oncology office revealed INR 1.50. BMP and CBC were essentially unremarkable except for platelets 138,000.  Assessment/Plan:    Acute bilateral lower extremity DVT -In the setting of subtherapeutic INR and malignancy -Patient endorsed compliance with warfarin -continue heparin bridging with coumadin, INR 1.7 this AM  -Continue warfarin -No indication for IVC filter at this time -Oncologist notified and will see pt in consultation  -Oxycodone when necessary pain Paroxysmal atrial fibrillation -Presently sinus rhythm -CAHDS-VASc=4 -Continue Coumadin -Heparin bridging as discussed above -Continue metoprolol tartrate Ovarian carcinoma  -Status post exp laparotomy with  hysterectomy, sipping oophorectomy, and debulking 06/27/2014--Dr. 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  Family Communication:  plan of care discussed with the patient Disposition Plan: Barrier to discharge - still sub therapeutic INR, stable for transfer to telemetry bed   IV access:  Peripheral IV  Procedures and diagnostic studies:    No results found.  Medical Consultants:  Oncology  Other Consultants:  None   IAnti-Infectives:   None  Faye Ramsay, MD  [facility] Pager [phone]  If 7PM-7AM, please contact night-coverage [email or web address] Password [facility] 09/26/2014, 2:49 PM   LOS: 1 day   HPI/Subjective: No events overnight.   Objective: Filed Vitals:   09/26/14 1100 09/26/14 1200 09/26/14 1300 09/26/14 1400  BP: 147/76 90/69 113/47 138/84  Pulse: 78 79 73 79  Temp:  98.2 F (36.8 C)    TempSrc:  Oral    Resp: 18 20 24 16   Height:      Weight:      SpO2: 97% 100% 98% 100%    Intake/Output Summary (Last 24 hours) at 09/26/14 1449 Last data filed at 09/26/14 1400  Gross per 24 hour  Intake 1066.48 ml  Output    600 ml  Net 466.48 ml  Exam:   General:  Pt is alert, follows commands appropriately, not in acute distress  Cardiovascular: Regular rate and rhythm, no rubs, no  gallops  Respiratory: Clear to auscultation bilaterally, no wheezing, diminished breath sounds at bases   Abdomen: Soft, non tender, non distended, bowel sounds present, no guarding  Extremities: +2 bilateral LE edema and TTP RLE > LLE, pulses DP and PT palpable bilaterally  Neuro: Grossly nonfocal  Data Reviewed: Basic Metabolic Panel:  Recent Labs Lab 09/25/14 1043 09/26/14 0358  NA 141 137  K 3.7 3.0*  CL  --  108  CO2 21* 23  GLUCOSE 83 89  BUN 7.8 8  CREATININE 0.5* 0.35*  CALCIUM 8.3* 7.8*   Liver Function Tests:  Recent Labs Lab 09/25/14 1043  AST 31  ALT 15  ALKPHOS 130  BILITOT 1.06  PROT 6.7  ALBUMIN 2.7*   CBC:  Recent Labs Lab 09/25/14 1043 09/26/14 0358  WBC 5.2 4.3  NEUTROABS 3.9  --   HGB 12.0 10.7*  HCT 36.9 33.0*  MCV 96.9 97.1  PLT 138* 128*    Recent Results (from the past 240 hour(s))  MRSA PCR Screening     Status: None   Collection Time: 09/25/14  8:35 PM  Result Value Ref Range Status   MRSA by PCR NEGATIVE NEGATIVE Final    Comment:        The GeneXpert MRSA Assay (FDA approved for NASAL specimens only), is one component of a comprehensive MRSA colonization surveillance program. It is not intended to diagnose MRSA infection nor to guide or monitor treatment for MRSA infections.      Scheduled Meds: . ALPRAZolam  1 mg Oral BID  . buPROPion  150 mg Oral Daily  . escitalopram  20 mg Oral Daily  . feeding supplement (ENSURE ENLIVE)  237 mL Oral BID BM  . furosemide  20 mg Oral Daily  . letrozole  2.5 mg Oral Daily  . levothyroxine  150 mcg Oral QAC breakfast  . metoprolol  50 mg Oral BID  . OLANZapine  2.5 mg Oral QHS  . polyethylene glycol  17 g Oral Daily  . potassium chloride  10 mEq Oral BID  . saccharomyces boulardii  250 mg Oral BID  . sodium chloride  3 mL Intravenous Q12H  . warfarin  4.5 mg Oral ONCE-1800  . Warfarin - Pharmacist Dosing Inpatient   Does not apply q1800   Continuous Infusions: .  heparin 1,350 Units/hr (09/26/14 0852)

## 2014-09-26 NOTE — Progress Notes (Signed)
MALEIGH BAGOT   DOB:12-20-1955   VO#:[id number]   YIR#:[id number]  Subjective: Keyri is "eating everything." This is a very positive sign in her. She denies SOB, cough, pleurisy; ambulated to BR w decreased weight bearing on Left; husband in room   Objective: middle aged White woman examined in bed Filed Vitals:   09/26/14 1641  BP: 117/62  Pulse: 72  Temp: 98.2 F (36.8 C)  Resp: 21    Body mass index is 26.41 kg/(m^2).  Intake/Output Summary (Last 24 hours) at 09/26/14 2036 Last data filed at 09/26/14 1800  Gross per 24 hour  Intake 1624.48 ml  Output   1150 ml  Net 474.48 ml     Sclerae unicteric  No peripheral adenopathy  Lungs clear -- auscultated anterolaterally  Heart regular rate and rhythm  Abdomen obese, benign  MSK LLE edema a bit softer; still somewhat erythematous; RLE minimal edema  Neuro nonfocal  Breast exam: deferred  CBG (last 3)  No results for input(s): GLUCAP in the last 72 hours.   Labs:  Lab Results  Component Value Date   WBC 4.3 09/26/2014   HGB 10.7* 09/26/2014   HCT 33.0* 09/26/2014   MCV 97.1 09/26/2014   PLT 128* 09/26/2014   NEUTROABS 3.9 09/25/2014    _0 @  Urine Studies No results for input(s): UHGB, CRYS in the last 72 hours.  Invalid input(s): UACOL, UAPR, USPG, UPH, UTP, UGL, UKET, UBIL, UNIT, UROB, ULEU, UEPI, UWBC, URBC, UBAC, CAST, UCOM, BILUA  Basic Metabolic Panel:  Recent Labs Lab 09/25/14 1043 09/26/14 0358  NA 141 137  K 3.7 3.0*  CL  --  108  CO2 21* 23  GLUCOSE 83 89  BUN 7.8 8  CREATININE 0.5* 0.35*  CALCIUM 8.3* 7.8*   GFR Estimated Creatinine Clearance: 74.4 mL/min (by C-G formula based on Cr of 0.35). Liver Function Tests:  Recent Labs Lab 09/25/14 1043  AST 31  ALT 15  ALKPHOS 130  BILITOT 1.06  PROT 6.7  ALBUMIN 2.7*   No results for input(s): LIPASE, AMYLASE in the last 168 hours. No results for input(s): AMMONIA in the last 168 hours. Coagulation profile  Recent  Labs Lab 09/25/14 1043 09/26/14 0358  INR 1.50* 1.77*  PROTIME 18.0*  --     CBC:  Recent Labs Lab 09/25/14 1043 09/26/14 0358  WBC 5.2 4.3  NEUTROABS 3.9  --   HGB 12.0 10.7*  HCT 36.9 33.0*  MCV 96.9 97.1  PLT 138* 128*   Cardiac Enzymes: No results for input(s): CKTOTAL, CKMB, CKMBINDEX, TROPONINI in the last 168 hours. BNP: Invalid input(s): POCBNP CBG: No results for input(s): GLUCAP in the last 168 hours. D-Dimer No results for input(s): DDIMER in the last 72 hours. Hgb A1c No results for input(s): HGBA1C in the last 72 hours. Lipid Profile No results for input(s): CHOL, HDL, LDLCALC, TRIG, CHOLHDL, LDLDIRECT in the last 72 hours. Thyroid function studies No results for input(s): TSH, T4TOTAL, T3FREE, THYROIDAB in the last 72 hours.  Invalid input(s): FREET3 Anemia work up No results for input(s): VITAMINB12, FOLATE, FERRITIN, TIBC, IRON, RETICCTPCT in the last 72 hours. Microbiology Recent Results (from the past 240 hour(s))  MRSA PCR Screening     Status: None   Collection Time: 09/25/14  8:35 PM  Result Value Ref Range Status   MRSA by PCR NEGATIVE NEGATIVE Final    Comment:        The GeneXpert MRSA Assay (FDA approved for NASAL specimens only), is  one component of a comprehensive MRSA colonization surveillance program. It is not intended to diagnose MRSA infection nor to guide or monitor treatment for MRSA infections.       Studies:  No results found.  Assessment: 59 y.o. [name] admitted with bilateral DVTs in setting of subtherapeutic INR  (1) status post right mastectomy 06/08/2012 for a pT3, pN1a, stage IIIA invasive ductal carcinoma, grade 2, estrogen and progesterone receptor positive, HER-2/neu negative, with an MIB-1 of 20%.   (2) treated in the adjuvant setting with docetaxel/ cyclophosphamide given every 3 weeks. There were multiple and severe complications, and the patient tolerated only two cycles, last dose  07/29/2012  (3) letrozole started 08/16/2012 (a) osteopenia, with a T score of -1.14 April 2013  (4) postmastectomy radiation completed 12/24/2012  (5) comorbidities include diabetes, hypertension, chronic liver disease with cirrhosis and fatty liver, and nutritional disturbance with temporarily dependence on PEG feeds (discontinued July 2014).  (6) restaging studies January 2016 show (a) 8 mm and 4 mm Right middle lobe lung nodules, not metabolically active on PET -- will require follow-up (b) hypermetabolic 92.4 cm mixed solid/cystic lesion in upper pelvis, with associated adenopathy  (7) status post exploratory laparotomy 06/27/2014 with total abdominal hysterectomy, bilateral salpingo-oophorectomy, para-aortic lymphadenectomy, omentectomy and radical tumor debulking for a clear cell ovarian cancer, pT1c pN1, stage IIIC .  (8) adjuvant chemotherapy consisting of carboplatin and paclitaxel given weekly days 1 and 8 of each 21 day cycle, for 6-8 cycles as tolerated, started 07/17/2014, with onpro support (a) day 8 cycle 2 omitted and further treatment delayed because of an episode of Klebsiella sepsis requiring intensive care unit admission  (9) starting abraxane 10/06/2014, to be repeated every 14 days  Plan: Floretta is symptomatically better and the LLE seems softer, though still swollen. The mobile thrombus of course was right-sided. INR still subtherapeutic  Would continue IV heparin through tomorrow and repeat dopplers 09/28/2014. If we can document improvement and INR therapeutic could d/c to home that day or the next.  We are postponing chemotherapy until 10/06/2014.  Appreciate your help to this patient!   Chauncey Cruel, MD 09/26/2014  8:36 PM Medical Oncology and Hematology [facility] [address] [place], [place] [address] Tel. [phone]    Fax. [phone]

## 2014-09-26 NOTE — Telephone Encounter (Signed)
Spoke with pt's spouse. Saw Dr Jana Hakim Monday and had doppler. + for DVT and pt currently in hospital.

## 2014-09-26 NOTE — Progress Notes (Signed)
ANTICOAGULATION CONSULT NOTE - Follow Up Consult  Pharmacy Consult for Heparin Indication: DVT  Allergies  Allergen Reactions  . Olmesartan Medoxomil Cough    REACTION: ? if cough  . Venlafaxine Other (See Comments)    REACTION: severe dry mouth  . Adhesive [Tape] Rash  . Tetracycline Hcl Other (See Comments)    Unknown reaction, too long for patient to remember     Patient Measurements: Height: 5\' 4"  (162.6 cm) Weight: 159 lb (72.122 kg) IBW/kg (Calculated) : 54.7 Heparin Dosing Weight:   Vital Signs: Temp: 99 F (37.2 C) (05/17 0000) Temp Source: Oral (05/17 0000) BP: 130/51 mmHg (05/16 2200) Pulse Rate: 87 (05/16 2301)  Labs:  Recent Labs  09/25/14 1043 09/25/14 1043 09/25/14 2331  HGB 12.0  --   --   HCT 36.9  --   --   PLT 138*  --   --   INR 1.50*  --   --   HEPARINUNFRC  --   --  0.21*  CREATININE  --  0.5*  --     Estimated Creatinine Clearance: 72.8 mL/min (by C-G formula based on Cr of 0.5).   Medications:  Infusions:  . heparin      Assessment: Patient with lower heparin level.  Patient a transfer from [facility], per RN at [facility], no heparin issues.  Goal of Therapy:  Heparin level 0.3-0.7 units/ml Monitor platelets by anticoagulation protocol: Yes   Plan:  Increase heparin to 1350 units/hr Recheck level at [place], Shea Stakes Crowford 09/26/2014,1:34 AM

## 2014-09-26 NOTE — Progress Notes (Signed)
PHARMACY - HEPARIN (brief note)  Heparin infusing @ 1350 units/hr for treatment of DVT   Repeat heparin level = 0.35 (goal 0.3-0.7) which is therapeutic x 2 on current rate  No complications of therapy noted  Plan:  Continue IV heparin @ 1350 units/hr           F/U AM Heparin level, CBC and INR  Leone Haven, PharmD

## 2014-09-26 NOTE — Progress Notes (Signed)
Initial Nutrition Assessment  DOCUMENTATION CODES:  Not applicable  INTERVENTION:  Ensure Enlive (each supplement provides 350kcal and 20 grams of protein)- provide BID between meals  NUTRITION DIAGNOSIS:  Increased nutrient needs related to cancer and cancer related treatments as evidenced by estimated needs.  GOAL:  Patient will meet greater than or equal to 90% of their needs  MONITOR:  PO intake, Supplement acceptance, Labs, Weight trends  REASON FOR ASSESSMENT:  Malnutrition Screening Tool    ASSESSMENT: 59 year old female with a history of NASH liver cirrhosis, paroxysmal atrial fibrillation, chronic systolic CHF, right-sided breast cancer, and ovarian cancer presented to her oncologist office today for edema and pain about her left lower extremity.   - Pt ate 100% of breakfast this morning (egg sandwich and coke) - She reports that she is hungry and feels like eating lunch.  - Pt drinks Ensure supplements at home BID. RD to continue while in hospital - No recent weight loss.  - Labs and medications reviewed  K 3.0  Height:  Ht Readings from Last 1 Encounters:  09/25/14 5\' 4"  (1.626 m)    Weight:  Wt Readings from Last 1 Encounters:  09/25/14 159 lb (72.122 kg)    Ideal Body Weight:  54.5 kg  Wt Readings from Last 10 Encounters:  09/25/14 159 lb (72.122 kg)  09/25/14 159 lb 3.2 oz (72.213 kg)  09/15/14 163 lb (73.936 kg)  09/04/14 159 lb (72.122 kg)  08/29/14 160 lb 12.8 oz (72.938 kg)  08/29/14 159 lb (72.122 kg)  08/22/14 157 lb 12.8 oz (71.578 kg)  08/18/14 166 lb (75.297 kg)  08/18/14 166 lb 12.8 oz (75.66 kg)  08/14/14 185 lb 8 oz (84.142 kg)    BMI:  Body mass index is 27.28 kg/(m^2).  Estimated Nutritional Needs:  Kcal:  1850-2000  Protein:  95-105 g  Fluid:  1.9-2.0 L/day  Skin:  Reviewed, no issues  Diet Order:  Diet regular Room service appropriate?: Yes; Fluid consistency:: Thin  EDUCATION NEEDS:   No education needs  identified at this time   Intake/Output Summary (Last 24 hours) at 09/26/14 1341 Last data filed at 09/26/14 1330  Gross per 24 hour  Intake 439.48 ml  Output    600 ml  Net -160.52 ml    Last BM:  Prior to admission  Laurette Schimke [place], [place], [place]

## 2014-09-26 NOTE — Progress Notes (Signed)
ANTICOAGULATION CONSULT NOTE - Follow Up Consult  Pharmacy Consult for Heparin and coumadin Indication: DVT  Allergies  Allergen Reactions  . Olmesartan Medoxomil Cough    REACTION: ? if cough  . Venlafaxine Other (See Comments)    REACTION: severe dry mouth  . Adhesive [Tape] Rash  . Tetracycline Hcl Other (See Comments)    Unknown reaction, too long for patient to remember     Patient Measurements: Height: 5\' 4"  (162.6 cm) Weight: 159 lb (72.122 kg) IBW/kg (Calculated) : 54.7 Heparin Dosing Weight: 69 kg  Vital Signs: Temp: 98.2 F (36.8 C) (05/17 1200) Temp Source: Oral (05/17 1200) BP: 148/91 mmHg (05/17 0800) Pulse Rate: 83 (05/17 0800)  Labs:  Recent Labs  09/25/14 1043 09/25/14 1043 09/25/14 2331 09/26/14 0358 09/26/14 1121  HGB 12.0  --   --  10.7*  --   HCT 36.9  --   --  33.0*  --   PLT 138*  --   --  128*  --   LABPROT  --   --   --  20.8*  --   INR 1.50*  --   --  1.77*  --   HEPARINUNFRC  --   --  0.21*  --  0.31  CREATININE  --  0.5*  --  0.35*  --     Estimated Creatinine Clearance: 72.8 mL/min (by C-G formula based on Cr of 0.35).   Medications:  Infusions:  . heparin 1,350 Units/hr (09/26/14 8022)    Assessment: 59 yo F undergoing treatment for ER positive breast cancer and clear cell ovarian cancer presenting to ED on 09/25/2014 with bilateral DVTs in lower extremities. Pharmacy has been consulted to dose heparin and Coumadin. Of note, patient has a history of DVTs and PAF for which she is on chronic warfarin therapy. INR on admission is SUBtherapeutic at 1.50.   Home warfarin: 4.5 mg daily except 3 mg MWF (recently increased to this dose on 5/9 for INR 1.8 based on [facility] visit notes)  Today, @TD @ - overlap day #2 of 5 days minimum - INR up 1.77 -Heparin level at goal 0.31 - hgb down 10.7, plt low but stable - no significant drug-drug intxn  Goal of Therapy:  Heparin level 0.3-0.7 units/ml Monitor platelets by anticoagulation  protocol: Yes   Plan:  - cont heparin to 1350 units/hr - Recheck level at 6PM to confirm before changing to daily heparin level monitoring - coumadin 4.5 mg PO x1  Ovadia Lopp P 09/26/2014,12:22 PM

## 2014-09-26 NOTE — Care Management Note (Signed)
Case Management Note  Patient Details  Name: HAROLYN COCKER MRN: [id number] Date of Birth: Nov 08, 1955  Subjective/Objective:                 dvt versus sepsis and systemic reaction in patient with history of recent chemo therapy   Action/Plan: home   Expected Discharge Date:              [id number]    Expected Discharge Plan:  Home/Self Care  In-House Referral:  NA  Discharge planning Services  CM Consult  Post Acute Care Choice:  NA Choice offered to:  NA  DME Arranged:    DME Agency:     HH Arranged:    HH Agency:     Status of Service:  In process, will continue to follow  Medicare Important Message Given:    Date Medicare IM Given:    Medicare IM give by:    Date Additional Medicare IM Given:    Additional Medicare Important Message give by:     If discussed at [place] of Stay Meetings, dates discussed:    Additional Comments:  Leeroy Cha, RN 09/26/2014, 12:00 PM

## 2014-09-26 NOTE — Progress Notes (Signed)
Pt states that she does not have her home CPAP with her and that she tried our CPAP last night and could not tolerate it. RT instructed patient to call respiratory if she decided she wanted to try our machine again.

## 2014-09-27 DIAGNOSIS — K7460 Unspecified cirrhosis of liver: Secondary | ICD-10-CM

## 2014-09-27 DIAGNOSIS — K769 Liver disease, unspecified: Secondary | ICD-10-CM

## 2014-09-27 LAB — CBC
HCT: 34 % — ABNORMAL LOW (ref 36.0–46.0)
Hemoglobin: 10.7 g/dL — ABNORMAL LOW (ref 12.0–15.0)
MCH: 30.7 pg (ref 26.0–34.0)
MCHC: 31.5 g/dL (ref 30.0–36.0)
MCV: 97.7 fL (ref 78.0–100.0)
PLATELETS: 136 10*3/uL — AB (ref 150–400)
RBC: 3.48 MIL/uL — AB (ref 3.87–5.11)
RDW: 16.8 % — ABNORMAL HIGH (ref 11.5–15.5)
WBC: 4 10*3/uL (ref 4.0–10.5)

## 2014-09-27 LAB — HEPARIN LEVEL (UNFRACTIONATED)
HEPARIN UNFRACTIONATED: 0.22 [IU]/mL — AB (ref 0.30–0.70)
HEPARIN UNFRACTIONATED: 0.44 [IU]/mL (ref 0.30–0.70)
Heparin Unfractionated: 0.34 IU/mL (ref 0.30–0.70)

## 2014-09-27 LAB — PROTIME-INR
INR: 1.97 — AB (ref 0.00–1.49)
Prothrombin Time: 22.6 seconds — ABNORMAL HIGH (ref 11.6–15.2)

## 2014-09-27 MED ORDER — HEPARIN (PORCINE) IN NACL 100-0.45 UNIT/ML-% IJ SOLN
1550.0000 [IU]/h | INTRAMUSCULAR | Status: DC
  Administered 2014-09-27 – 2014-09-30 (×4): 1500 [IU]/h via INTRAVENOUS
  Administered 2014-10-01 – 2014-10-02 (×2): 1550 [IU]/h via INTRAVENOUS
  Filled 2014-09-27 (×5): qty 250

## 2014-09-27 MED ORDER — WARFARIN SODIUM 3 MG PO TABS
3.0000 mg | ORAL_TABLET | Freq: Once | ORAL | Status: AC
  Administered 2014-09-27: 3 mg via ORAL
  Filled 2014-09-27: qty 1

## 2014-09-27 NOTE — Progress Notes (Signed)
Patient refused CPAP at this time.  States she cant use hospital mask.  Patient made aware she can bring her mask from home.  Encouraged patient to call for Respiratory to bring CPAP machine if she desires to use it during hospital stay.

## 2014-09-27 NOTE — Progress Notes (Addendum)
Progress Note   [name] WYO:[id number] DOB: April 22, 1956 DOA: 09/25/2014 PCP: [name], NP   Brief Narrative:   [name] is an 59 y.o. 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  Assessment/Plan:   Principal problem:  Acute bilateral lower extremity DVT - In the setting of subtherapeutic INR and malignancy despite reported compliance with warfarin. - Continue heparin bridging with Coumadin, INR 1.97 today. - No indication for IVC filter at this time. - Per oncology recs: Would continue IV heparin through tomorrow and repeat dopplers 09/28/2014.  - Continue Oxycodone when necessary pain.  Active problems: Paroxysmal atrial fibrillation - Presently sinus rhythm.  - CAHDS-VASc=4.  Continue Coumadin with Heparin bridging.  - Continue metoprolol tartrate for rate control.  Ovarian carcinoma  -Status post exp laparotomy with hysterectomy, TAH/BSO, and debulking 06/27/2014--Dr. Denman George and chemotherapy.  Right Breast Cancer - Followed by Dr. Jana Hakim.  Continue Femara. - Chemotherapy postponed until 10/06/14.  Depression/anxiety - Continue Lexapro, Cipro XL, alprazolam, and when necessary clonazepam.   Chronic systolic and diastolic CHF  - Clinically compensated at this time.  -.08/02/2014 echocardiogram showed EF 45-50 percent. - Continue home dose furosemide.   Hypertension - Controlled on metoprolol.  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 - Monitor for signs of bleeding.  Hypokalemia - Supplemented.  Anemia of chronic disease, malignancy - Hemoglobin stable with no current indication for transfusion.   Code Status:  Full.  Family Communication: Spouse, [name] updated by telephone [phone]. Disposition Plan: Discharge home when INR therapeutic 48 hours.    IV Access:    Porta-Cath   Procedures and diagnostic studies:   No results found.   Medical Consultants:    Dr. Tressa Busman, Oncology  Anti-Infectives:    None.  Subjective:    [name] feels well except for some lower extremity discomfort. Appetite good. No nausea or vomiting. No shortness of breath, or cough.  Objective:    Filed Vitals:   09/26/14 1641 09/26/14 2141 09/26/14 2213 09/27/14 0615  BP: 117/62 129/58 125/58 128/54  Pulse: 72 79 78 73  Temp: 98.2 F (36.8 C) 98.2 F (36.8 C) 98.4 F (36.9 C) 98.2 F (36.8 C)  TempSrc: Oral Oral Oral Oral  Resp: 21 20 20 20   Height: 5\' 5"  (1.651 m)     Weight: 72 kg (158 lb 11.7 oz)     SpO2: 100% 96% 99% 94%    Intake/Output Summary (Last 24 hours) at 09/27/14 0813 Last data filed at 09/27/14 6720  Gross per 24 hour  Intake   1665 ml  Output   1840 ml  Net   -175 ml    Exam: Gen:  NAD Cardiovascular:  RRR, II/VI SEM Respiratory:  Lungs CTAB Gastrointestinal:  Abdomen soft, NT/ND, + BS Extremities:  Left leg massively swollen   Data Reviewed:    Labs: Basic Metabolic Panel:  Recent Labs Lab 09/25/14 1043 09/26/14 0358  NA 141 137  K 3.7 3.0*  CL  --  108  CO2 21* 23  GLUCOSE 83 89  BUN 7.8 8  CREATININE 0.5* 0.35*  CALCIUM 8.3* 7.8*   GFR Estimated Creatinine Clearance: 74.4 mL/min (by C-G formula based on  Cr of 0.35). Liver Function Tests:  Recent Labs Lab 09/25/14 1043  AST 31  ALT 15  ALKPHOS 130  BILITOT 1.06  PROT 6.7  ALBUMIN 2.7*   Coagulation profile  Recent Labs Lab 09/25/14 1043 09/26/14 0358 09/27/14 0515  INR 1.50* 1.77* 1.97*  PROTIME 18.0*  --   --     CBC:  Recent Labs Lab 09/25/14 1043 09/26/14 0358 09/27/14 0515  WBC 5.2 4.3 4.0  NEUTROABS 3.9  --   --   HGB 12.0 10.7* 10.7*  HCT  36.9 33.0* 34.0*  MCV 96.9 97.1 97.7  PLT 138* 128* 136*   Microbiology Recent Results (from the past 240 hour(s))  MRSA PCR Screening     Status: None   Collection Time: 09/25/14  8:35 PM  Result Value Ref Range Status   MRSA by PCR NEGATIVE NEGATIVE Final    Comment:        The GeneXpert MRSA Assay (FDA approved for NASAL specimens only), is one component of a comprehensive MRSA colonization surveillance program. It is not intended to diagnose MRSA infection nor to guide or monitor treatment for MRSA infections.      Medications:   . ALPRAZolam  1 mg Oral BID  . buPROPion  150 mg Oral Daily  . escitalopram  20 mg Oral Daily  . feeding supplement (ENSURE ENLIVE)  237 mL Oral BID BM  . furosemide  20 mg Oral Daily  . letrozole  2.5 mg Oral Daily  . levothyroxine  150 mcg Oral QAC breakfast  . metoprolol  50 mg Oral BID  . OLANZapine  2.5 mg Oral QHS  . pneumococcal 23 valent vaccine  0.5 mL Intramuscular Tomorrow-1000  . polyethylene glycol  17 g Oral Daily  . potassium chloride  10 mEq Oral BID  . saccharomyces boulardii  250 mg Oral BID  . sodium chloride  3 mL Intravenous Q12H  . Warfarin - Pharmacist Dosing Inpatient   Does not apply q1800   Continuous Infusions: . heparin 1,500 Units/hr (09/27/14 0719)    Time spent: 25 minutes.   LOS: 2 days   Neftali Thurow  [facility] Pager [phone]. If unable to reach me by pager, please call my cell phone at [phone].  *Please refer to [email or web address], password [email or web address] to get updated schedule on who will round on this patient, as hospitalists switch teams weekly. If 7PM-7AM, please contact night-coverage at [email or web address], password [email or web address] for any overnight needs.  09/27/2014, 8:13 AM

## 2014-09-27 NOTE — Progress Notes (Signed)
ANTICOAGULATION CONSULT NOTE - Follow Up Consult  Pharmacy Consult for Heparin Indication: DVT  Allergies  Allergen Reactions  . Olmesartan Medoxomil Cough    REACTION: ? if cough  . Venlafaxine Other (See Comments)    REACTION: severe dry mouth  . Adhesive [Tape] Rash  . Tetracycline Hcl Other (See Comments)    Unknown reaction, too long for patient to remember     Patient Measurements: Height: 5\' 5"  (165.1 cm) Weight: 158 lb 11.7 oz (72 kg) IBW/kg (Calculated) : 57 Heparin Dosing Weight:   Vital Signs: Temp: 98.2 F (36.8 C) (05/18 0615) Temp Source: Oral (05/18 0615) BP: 128/54 mmHg (05/18 0615) Pulse Rate: 73 (05/18 0615)  Labs:  Recent Labs  09/25/14 1043 09/25/14 1043  09/26/14 0358 09/26/14 1121 09/26/14 1800 09/27/14 0515  HGB 12.0  --   --  10.7*  --   --  10.7*  HCT 36.9  --   --  33.0*  --   --  34.0*  PLT 138*  --   --  128*  --   --  136*  LABPROT  --   --   --  20.8*  --   --  22.6*  INR 1.50*  --   --  1.77*  --   --  1.97*  HEPARINUNFRC  --   --   < >  --  0.31 0.35 0.22*  CREATININE  --  0.5*  --  0.35*  --   --   --   < > = values in this interval not displayed.  Estimated Creatinine Clearance: 74.4 mL/min (by C-G formula based on Cr of 0.35).   Medications:  Infusions:  . heparin      Assessment: Patient with low heparin level.  No heparin issues noted per RN.  Goal of Therapy:  Heparin level 0.3-0.7 units/ml Monitor platelets by anticoagulation protocol: Yes   Plan:  Increase heparin to 1500 units/hr Recheck level at 1400.  Tyler Deis, Shea Stakes Crowford 09/27/2014,7:07 AM

## 2014-09-27 NOTE — Plan of Care (Signed)
Problem: Phase I Progression Outcomes Goal: OOB as tolerated unless otherwise ordered Outcome: Not Progressing Patient too weak. DVT to right leg and left leg very edematous.

## 2014-09-27 NOTE — Progress Notes (Signed)
ANTICOAGULATION CONSULT NOTE - Follow Up Consult  Pharmacy Consult for Heparin and coumadin Indication: DVT  Allergies  Allergen Reactions  . Olmesartan Medoxomil Cough    REACTION: ? if cough  . Venlafaxine Other (See Comments)    REACTION: severe dry mouth  . Adhesive [Tape] Rash  . Tetracycline Hcl Other (See Comments)    Unknown reaction, too long for patient to remember     Patient Measurements: Height: 5\' 5"  (165.1 cm) Weight: 158 lb 11.7 oz (72 kg) IBW/kg (Calculated) : 57 Heparin Dosing Weight: 69 kg  Vital Signs: Temp: 98 F (36.7 C) (05/18 1347) Temp Source: Oral (05/18 1347) BP: 126/59 mmHg (05/18 1347) Pulse Rate: 84 (05/18 1347)  Labs:  Recent Labs  09/25/14 1043 09/25/14 1043  09/26/14 0358  09/26/14 1800 09/27/14 0515 09/27/14 1400  HGB 12.0  --   --  10.7*  --   --  10.7*  --   HCT 36.9  --   --  33.0*  --   --  34.0*  --   PLT 138*  --   --  128*  --   --  136*  --   LABPROT  --   --   --  20.8*  --   --  22.6*  --   INR 1.50*  --   --  1.77*  --   --  1.97*  --   HEPARINUNFRC  --   --   < >  --   < > 0.35 0.22* 0.34  CREATININE  --  0.5*  --  0.35*  --   --   --   --   < > = values in this interval not displayed.  Estimated Creatinine Clearance: 74.4 mL/min (by C-G formula based on Cr of 0.35).   Medications:  Infusions:  . heparin 1,500 Units/hr (09/27/14 0719)    Assessment: 59 yo F undergoing treatment for ER positive breast cancer and clear cell ovarian cancer presenting to ED on 09/25/2014 with bilateral DVTs in lower extremities. Pharmacy has been consulted to dose heparin and Coumadin. Of note, patient has a history of DVTs and PAF for which she is on chronic warfarin therapy. INR on admission is SUBtherapeutic at 1.50.   Home warfarin: 4.5 mg daily except 3 mg MWF (recently increased to this dose on 5/9 for INR 1.8 based on [facility] visit notes)  Today, 09/27/2014 - overlap day #3 of 5 days minimum - INR up 1.97, just below goal after  doses of 4.5mg  x 2 doses - Heparin level now therapeutic on rate of 1500 units/hr - hgb down 10.7, plt low but stable - no significant drug-drug intxn - no reported bleeding  Goal of Therapy:  Heparin level 0.3-0.7 units/ml Monitor platelets by anticoagulation protocol: Yes   Plan:  1) Continue IV heparin at current rate of 1500 units/hr 2) Recheck heparin level at 8pm to confirm goal level at current rate continues 3) 3mg  Warfarin tonight 4) Daily INR/CBC/heparin level   Adrian Saran, PharmD, BCPS Pager [phone] 09/27/2014 2:44 PM

## 2014-09-27 NOTE — Telephone Encounter (Signed)
Noted  

## 2014-09-28 ENCOUNTER — Inpatient Hospital Stay (HOSPITAL_COMMUNITY): Payer: BLUE CROSS/BLUE SHIELD

## 2014-09-28 ENCOUNTER — Encounter (HOSPITAL_COMMUNITY): Payer: Self-pay | Admitting: Radiology

## 2014-09-28 DIAGNOSIS — Z86718 Personal history of other venous thrombosis and embolism: Secondary | ICD-10-CM

## 2014-09-28 LAB — CBC
HCT: 34.6 % — ABNORMAL LOW (ref 36.0–46.0)
Hemoglobin: 11.1 g/dL — ABNORMAL LOW (ref 12.0–15.0)
MCH: 31.4 pg (ref 26.0–34.0)
MCHC: 32.1 g/dL (ref 30.0–36.0)
MCV: 98 fL (ref 78.0–100.0)
Platelets: 158 10*3/uL (ref 150–400)
RBC: 3.53 MIL/uL — ABNORMAL LOW (ref 3.87–5.11)
RDW: 16.8 % — AB (ref 11.5–15.5)
WBC: 4.1 10*3/uL (ref 4.0–10.5)

## 2014-09-28 LAB — HEPARIN LEVEL (UNFRACTIONATED): HEPARIN UNFRACTIONATED: 0.53 [IU]/mL (ref 0.30–0.70)

## 2014-09-28 LAB — TYPE AND SCREEN
ABO/RH(D): O POS
Antibody Screen: NEGATIVE

## 2014-09-28 LAB — PROTIME-INR
INR: 1.69 — AB (ref 0.00–1.49)
PROTHROMBIN TIME: 20 s — AB (ref 11.6–15.2)

## 2014-09-28 LAB — FIBRINOGEN: Fibrinogen: 236 mg/dL (ref 204–475)

## 2014-09-28 MED ORDER — WARFARIN SODIUM 5 MG PO TABS: 5.0000 mg | ORAL_TABLET | Freq: Once | ORAL | Status: DC

## 2014-09-28 NOTE — Progress Notes (Addendum)
Progress Note   [name]:[id number] DOB: 10-20-1955 DOA: 09/25/2014 PCP: [name], NP   Brief Narrative:   [name] is an 59 y.o. 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  The DVT on the right is at high risk for embolization, and an IVC filter was planned to be placed, but after discussing case with IR, Dr. Jana Hakim has opted to pursue the recommendation to perform thromboysis.  The patient was transferred to [facility] after CT head done to R/O any evidence of intracranial bleeding.  Assessment/Plan:   Principal problem:  Acute bilateral lower extremity DVT - In the setting of subtherapeutic INR and malignancy despite reported compliance with warfarin. - RLE clot high risk for embolism, initially planned to have IVC filter placed by IR recommends thrombolysis instead. - Continue heparin, hold coumadin with plans for thrombolysis 09/29/14 per IR of RLE clot. - Resume coumadin post thrombolysis when OK with IR. - Continue Oxycodone when necessary pain.  Active problems: Paroxysmal atrial fibrillation - Presently sinus rhythm.  - CAHDS-VASc=4.  Continue heparin.  Resume coumadin post thrombolysis.  - Continue metoprolol tartrate for rate control.  Ovarian carcinoma  - Status post exp laparotomy with hysterectomy, TAH/BSO, and debulking 06/27/2014--Dr. Denman George and chemotherapy.  Right Breast Cancer - Followed by Dr. Jana Hakim.  Continue Femara. - Chemotherapy postponed until 10/06/14.  Depression/anxiety - Continue Lexapro, Clonopin, alprazolam, and when necessary clonazepam.   Chronic systolic and diastolic CHF  - Clinically compensated at this time.  - 08/02/2014  echocardiogram showed EF 45-50 percent. - Continue home dose furosemide.   Hypertension - Controlled on metoprolol.  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 - Monitor for signs of bleeding.  Hypokalemia - Supplemented.  Anemia of chronic disease, malignancy - Hemoglobin stable with no current indication for transfusion.   Code Status: Full.  Family Communication: Spouse, [name] updated by telephone [phone]. Disposition Plan: Discharge home when hemodynamically stable post thrombolysis, and INR therapeutic.    IV Access:    Porta-Cath   Procedures and diagnostic studies:   No results found.   Medical Consultants:    Dr. Tressa Busman, Oncology  Anti-Infectives:    None.  Subjective:   [name] feels well. Appetite good. No nausea or vomiting. No shortness of breath, or cough.  Not having leg pain today.  Has not been bearing weight on the right.    Objective:    Filed Vitals:   09/27/14 1347 09/27/14 2055 09/28/14 0500 09/28/14 0546  BP: 126/59 115/60  130/58  Pulse: 84 70  76  Temp: 98 F (36.7 C) 98 F (36.7 C)  98.1 F (36.7 C)  TempSrc: Oral Oral  Oral  Resp: 20 20  20   Height:      Weight:   72.1 kg (158 lb 15.2 oz)   SpO2: 99% 95%  99%    Intake/Output Summary (Last 24 hours) at 09/28/14 0848 Last data filed at 09/28/14 0749  Gross per 24 hour  Intake    720 ml  Output   1000 ml  Net   -280 ml    Exam: Gen:  NAD, pale with alopecia Cardiovascular:  RRR, II/VI SEM Respiratory:  Lungs  CTAB Gastrointestinal:  Abdomen soft, NT/ND, + BS Extremities:  Left leg massively swollen   Data Reviewed:    Labs: Basic Metabolic Panel:  Recent Labs Lab 09/25/14 1043 09/26/14 0358  NA 141 137  K 3.7 3.0*  CL  --  108  CO2 21* 23  GLUCOSE 83 89  BUN 7.8 8  CREATININE 0.5* 0.35*  CALCIUM 8.3* 7.8*   GFR Estimated Creatinine Clearance: 74.4 mL/min (by C-G formula based on Cr of  0.35). Liver Function Tests:  Recent Labs Lab 09/25/14 1043  AST 31  ALT 15  ALKPHOS 130  BILITOT 1.06  PROT 6.7  ALBUMIN 2.7*   Coagulation profile  Recent Labs Lab 09/25/14 1043 09/26/14 0358 09/27/14 0515 09/28/14 0506  INR 1.50* 1.77* 1.97* 1.69*  PROTIME 18.0*  --   --   --     CBC:  Recent Labs Lab 09/25/14 1043 09/26/14 0358 09/27/14 0515 09/28/14 0506  WBC 5.2 4.3 4.0 4.1  NEUTROABS 3.9  --   --   --   HGB 12.0 10.7* 10.7* 11.1*  HCT 36.9 33.0* 34.0* 34.6*  MCV 96.9 97.1 97.7 98.0  PLT 138* 128* 136* 158   Microbiology Recent Results (from the past 240 hour(s))  MRSA PCR Screening     Status: None   Collection Time: 09/25/14  8:35 PM  Result Value Ref Range Status   MRSA by PCR NEGATIVE NEGATIVE Final    Comment:        The GeneXpert MRSA Assay (FDA approved for NASAL specimens only), is one component of a comprehensive MRSA colonization surveillance program. It is not intended to diagnose MRSA infection nor to guide or monitor treatment for MRSA infections.      Medications:   . ALPRAZolam  1 mg Oral BID  . buPROPion  150 mg Oral Daily  . escitalopram  20 mg Oral Daily  . feeding supplement (ENSURE ENLIVE)  237 mL Oral BID BM  . furosemide  20 mg Oral Daily  . letrozole  2.5 mg Oral Daily  . levothyroxine  150 mcg Oral QAC breakfast  . metoprolol  50 mg Oral BID  . OLANZapine  2.5 mg Oral QHS  . pneumococcal 23 valent vaccine  0.5 mL Intramuscular Tomorrow-1000  . polyethylene glycol  17 g Oral Daily  . potassium chloride  10 mEq Oral BID  . saccharomyces boulardii  250 mg Oral BID  . sodium chloride  3 mL Intravenous Q12H  . Warfarin - Pharmacist Dosing Inpatient   Does not apply q1800   Continuous Infusions: . heparin 1,500 Units/hr (09/27/14 1938)    Time spent: 35 minutes with > 50% of time discussing current diagnostic test results, clinical impression and plan of care with the patient and her husband.   LOS: 3 days    [facility] Pager [phone]. If unable to reach me by pager, please call my cell phone at [phone].  *Please refer to [email or web address], password [email or web address] to get updated schedule on who will round on this patient, as hospitalists switch teams weekly. If 7PM-7AM, please contact night-coverage at [email or web address], password [email or web address] for any overnight needs.  09/28/2014, 8:48 AM

## 2014-09-28 NOTE — Progress Notes (Signed)
*  Preliminary Results* Bilateral lower extremity venous duplex completed. Findings suggest extensive acute deep vein thrombosis involving all visualized veins of bilateral lower extremities, including mobile thrombus visualized in the right common femoral vein. There is no evidence of Baker's cyst bilaterally. There is no appreciable change when compared to prior study 09/25/2014.  Preliminary results discussed with Dr. Jana Hakim.   09/28/2014  Maudry Mayhew, RVT, RDCS, RDMS

## 2014-09-28 NOTE — Progress Notes (Signed)
RT applied CPAP of 4.5 cmH2O per patient's home setting via patient's home nasal pillows. Patient is tolerating at this time. RT will continue to monitor.

## 2014-09-28 NOTE — Progress Notes (Signed)
ANTICOAGULATION CONSULT NOTE - Follow Up Consult  Pharmacy Consult for Heparin and coumadin Indication: DVT  Allergies  Allergen Reactions  . Olmesartan Medoxomil Cough    REACTION: ? if cough  . Venlafaxine Other (See Comments)    REACTION: severe dry mouth  . Adhesive [Tape] Rash  . Tetracycline Hcl Other (See Comments)    Unknown reaction, too long for patient to remember     Patient Measurements: Height: 5\' 5"  (165.1 cm) Weight: 158 lb 11.7 oz (72 kg) IBW/kg (Calculated) : 57 Heparin Dosing Weight: 69 kg  Vital Signs: Temp: 98 F (36.7 C) (05/18 2055) Temp Source: Oral (05/18 2055) BP: 115/60 mmHg (05/18 2055) Pulse Rate: 70 (05/18 2055)  Labs:  Recent Labs  09/25/14 1043 09/25/14 1043  09/26/14 0358  09/27/14 0515 09/27/14 1400 09/27/14 1955  HGB 12.0  --   --  10.7*  --  10.7*  --   --   HCT 36.9  --   --  33.0*  --  34.0*  --   --   PLT 138*  --   --  128*  --  136*  --   --   LABPROT  --   --   --  20.8*  --  22.6*  --   --   INR 1.50*  --   --  1.77*  --  1.97*  --   --   HEPARINUNFRC  --   --   < >  --   < > 0.22* 0.34 0.44  CREATININE  --  0.5*  --  0.35*  --   --   --   --   < > = values in this interval not displayed.  Estimated Creatinine Clearance: 74.4 mL/min (by C-G formula based on Cr of 0.35).   Medications:  Infusions:  . heparin 1,500 Units/hr (09/27/14 1938)    Assessment: 59 yo F undergoing treatment for ER positive breast cancer and clear cell ovarian cancer presenting to ED on 09/25/2014 with bilateral DVTs in lower extremities. Pharmacy has been consulted to dose heparin and Coumadin. Of note, patient has a history of DVTs and PAF for which she is on chronic warfarin therapy. INR on admission is SUBtherapeutic at 1.50.   Home warfarin: 4.5 mg daily except 3 mg MWF (recently increased to this dose on 5/9 for INR 1.8 based on [facility] visit notes)  09/27/14 - overlap day #3 of 5 days minimum - INR up 1.97, just below goal after doses of  4.5mg  x 2 doses - Heparin level now therapeutic on rate of 1500 units/hr - hgb down 10.7, plt low but stable - no significant drug-drug intxn - no reported bleeding -1955 HL=0.44, no problems reported  Goal of Therapy:  Heparin level 0.3-0.7 units/ml Monitor platelets by anticoagulation protocol: Yes   Plan:  1) Continue IV heparin at current rate of 1500 units/hr 4) Daily INR/CBC/heparin level  [name] 09/28/2014 12:22 AM

## 2014-09-28 NOTE — H&P (Signed)
Chief Complaint: Chief Complaint  Patient presents with  . Leg Swelling   Referring Physician(s): Dr. Jana Hakim   History of Present Illness: [name] is a 59 y.o. female with history of breast cancer s/p mastectomy and ovarian cancer s/p TAH/BSO and s/p radiation and chemotherapy. She is on chronic coumadin for PAF and presented to ED with c/o worsening LLE swelling and pain since last Thursday. B/L LE venous duplex done revealed extensive acute DVT in all visualized veins, including mobile thrombus in right common femoral vein. IR received consult for evaluation. The patient denies any active bleeding, denies any history of hemorrhagic stroke, recent trauma, CPR or surgery. She denies any history of bleeding ulcers. She denies any CP/SOB. She denies any known kidney disease or difficulty with iodinated contrast. She denies any recent fever or chills. She has previously tolerated sedation without complications.    Past Medical History  Diagnosis Date  . Depression   . DVT (deep venous thrombosis)     hx of on HRT left leg ~2006  . GERD (gastroesophageal reflux disease)   . Hyperlipidemia   . Hypertension   . Hypothyroidism   . PPD positive, treated     rx inh   . Diabetes mellitus   . 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 DJD (degenerative joint disease) of lumbar spine   . Chronic combined systolic and diastolic CHF (congestive heart failure)     a. 08/2012 Echo: EF 55-60%, no rwma, mod MR;  b. 07/2014 Echo: EF 45-50%, distal antsept HK, mod TR/MR, mildly bil-atrial enlargement.  . Paroxysmal atrial fibrillation     a. CHA2DS2VASc = 4-->coumadin.  [name] Physical deconditioning   . Allergy   . Anemia     low iron hx  . Cataract     removed ou  . OSA on CPAP     cpap 4.5 setting  . Breast cancer 2014    a. Right - invasive ductal carcinoma with 2/18 lymph nodes involved (pT3, pN1a, stage IIIA), s/p R mastectomy 06/08/12, chemo (not well tolerated->d/c)   . Clear Cell Ovarian Cancer     a. 06/2014 s/p TAH/BSO debulking/lymph node dissection;  b. Now on chemo.    Past Surgical History  Procedure Laterality Date  . Tubal ligation    . Cholecystectomy    . Foot surgery    . Eye surgery    . Cataract extraction    . Percutaneous liver biopsy    . Breast biopsy      left breast  . Mastectomy modified radical  06/08/2012    Procedure: MASTECTOMY MODIFIED RADICAL;  Surgeon: Edward Jolly, MD;  Location: [place];  Service: General;  Laterality: Right;  . Portacath placement  06/08/2012    Procedure: INSERTION PORT-A-CATH;  Surgeon: Edward Jolly, MD;  Location: [place];  Service: General;  Laterality: Left;  . Peg tube placement      peg removed 2015  . History of chemotherapy x 2 treatments, radiation tx  2014  . Pac removed  2015  . Laparotomy N/A 06/27/2014    Procedure: EXPLORATORY LAPAROTOMY;  Surgeon: Everitt Amber, MD;  Location: [facility];  Service: Gynecology;  Laterality: N/A;  . Abdominal hysterectomy N/A 06/27/2014    Procedure: HYSTERECTOMY ABDOMINAL TOTAL;  Surgeon: Everitt Amber, MD;  Location: [facility];  Service: Gynecology;  Laterality: N/A;  . Salpingoophorectomy Bilateral 06/27/2014    Procedure: BILATERAL SALPINGO OOPHORECTOMY/TUMOR DEBULKING/LYMPHNODE DISSECTION, OMENTECTOMY;  Surgeon: Everitt Amber, MD;  Location: [facility];  Service: Gynecology;  Laterality: Bilateral;    Allergies: Olmesartan medoxomil; Venlafaxine; Adhesive; and Tetracycline hcl  Medications: Prior to Admission medications   Medication Sig Start Date End Date Taking? Authorizing Provider  ALPRAZolam (XANAX XR) 1 MG 24 hr tablet TAKE 1 TABLET BY MOUTH TWICE DAILY 09/18/14  Yes Chauncey Cruel, MD  buPROPion (WELLBUTRIN XL) 150 MG 24 hr tablet Take 1 tablet (150 mg total) by mouth daily. 09/15/14  Yes Debbrah Alar, NP  clonazePAM (KLONOPIN) 0.5 MG tablet TAKE 1/2 TABLET BY MOUTH TWICE DAILY AS NEEDED Patient taking differently: TAKE 1/2 TABLET BY MOUTH  TWICE DAILY 09/18/14  Yes Debbrah Alar, NP  escitalopram (LEXAPRO) 20 MG tablet Take 20 mg by mouth daily.   Yes Historical Provider, MD  famotidine (PEPCID) 20 MG tablet Take 1 tablet (20 mg total) by mouth daily. Patient taking differently: Take 20 mg by mouth daily as needed.  08/08/14  Yes Modena Jansky, MD  furosemide (LASIX) 20 MG tablet Take 1 tablet (20 mg total) by mouth daily. 05/23/14  Yes Debbrah Alar, NP  letrozole (FEMARA) 2.5 MG tablet Take 1 tablet (2.5 mg total) by mouth daily. 05/22/14  Yes Chauncey Cruel, MD  levothyroxine (SYNTHROID, LEVOTHROID) 150 MCG tablet Take 1 tablet (150 mcg total) by mouth daily. 05/23/14  Yes Debbrah Alar, NP  lidocaine-prilocaine (EMLA) cream Apply over port area 1-2 hours before chemotherapy 07/14/14  Yes Chauncey Cruel, MD  metoprolol (LOPRESSOR) 50 MG tablet Take 1 tablet (50 mg total) by mouth 2 (two) times daily. 08/25/14  Yes Debbrah Alar, NP  Nutritional Supplements (ENSURE HIGH PROTEIN PO) Take 237 mLs by mouth 2 (two) times daily.   Yes Historical Provider, MD  OLANZapine (ZYPREXA) 2.5 MG tablet Take 2.5 mg by mouth at bedtime.   Yes Historical Provider, MD  ondansetron (ZOFRAN) 8 MG tablet Take 1 tablet (8 mg total) by mouth 2 (two) times daily. Start the day after chemo for 2 days. Then take as needed for nausea or vomiting. 09/25/14  Yes Chauncey Cruel, MD  polyethylene glycol (MIRALAX / GLYCOLAX) packet Take 17 g by mouth daily as needed for moderate constipation.   Yes Historical Provider, MD  polyethylene glycol (MIRALAX / GLYCOLAX) packet Take 17 g by mouth daily. 09/04/14  Yes Charlesetta Shanks, MD  potassium chloride (K-DUR) 10 MEQ tablet Take 10 mEq by mouth 2 (two) times daily. 04/18/14  Yes Historical Provider, MD  prochlorperazine (COMPAZINE) 10 MG tablet Take 1 tablet (10 mg total) by mouth every 6 (six) hours as needed (Nausea or vomiting). 09/25/14  Yes Chauncey Cruel, MD  warfarin (COUMADIN) 3 MG tablet  Take 1 tablet (3 mg total) by mouth daily at 6 PM. Or as directed by coumadin clinic. Patient taking differently: Take 4.5 mg by mouth daily at 6 PM. Take 1.5 tablets (4.5 mg) daily 08/08/14  Yes Modena Jansky, MD  cephALEXin (KEFLEX) 500 MG capsule Take 1 capsule (500 mg total) by mouth 3 (three) times daily. 09/25/14   Chauncey Cruel, MD     Family History  Problem Relation Age of Onset  . Diabetes Mother   . Hypertension Mother   . Arthritis Mother   . Heart disease Mother   . Heart failure Mother   . Other Mother     benign breast mass  . Stroke Father   . Heart disease Father   . Diabetes Paternal Grandmother   .  Colon cancer Paternal Grandfather     History   Social History  . Marital Status: Married    Spouse Name: N/A  . Number of Children: 2  . Years of Education: N/A   Occupational History  . [place] History Main Topics  . Smoking status: Former Smoker -- 1.00 packs/day for 1 years  . Smokeless tobacco: Never Used  . Alcohol Use: No  . Drug Use: No     Comment: quit age 30 only smoked as a teen 1 year  . Sexual Activity: No   Other Topics Concern  . None   Social History Narrative   Married   Works at [employer] secretary   Daily caffeine use - 2 cups a day plus a couple of sodas a day   Pt doesn't exercise regularly   G2P2   H H of 5 soon to be 6 .     Review of Systems: A 12 point ROS discussed and pertinent positives are indicated in the HPI above.  All other systems are negative.  Review of Systems  Vital Signs: BP 128/57 mmHg  Pulse 75  Temp(Src) 98.1 F (36.7 C) (Oral)  Resp 20  Ht 5\' 5"  (1.651 m)  Wt 158 lb 15.2 oz (72.1 kg)  BMI 26.45 kg/m2  SpO2 97%  Physical Exam  Constitutional: She is oriented to person, place, and time. No distress.  HENT:  Head: Normocephalic and atraumatic.  Neck: No tracheal deviation present.  Cardiovascular: Normal rate and regular rhythm.  Exam reveals no gallop and no  friction rub.   No murmur heard. Pulmonary/Chest: Effort normal and breath sounds normal. No respiratory distress. She has no wheezes. She has no rales.  Abdominal: Soft. Bowel sounds are normal. She exhibits no distension. There is no tenderness.  Musculoskeletal: She exhibits edema.  LLE 2-3+ pitting edema   Neurological: She is alert and oriented to person, place, and time.  Skin: Skin is warm and dry. She is not diaphoretic.  Left port intact  Psychiatric: She has a normal mood and affect. Her behavior is normal. Thought content normal.   Mallampati Score:  MD Evaluation Airway: WNL Heart: WNL Abdomen: WNL Chest/ Lungs: WNL ASA  Classification: 3 Mallampati/Airway Score: Two  Imaging: No results found.  Labs:  CBC:  Recent Labs  09/25/14 1043 09/26/14 0358 09/27/14 0515 09/28/14 0506  WBC 5.2 4.3 4.0 4.1  HGB 12.0 10.7* 10.7* 11.1*  HCT 36.9 33.0* 34.0* 34.6*  PLT 138* 128* 136* 158    COAGS:  Recent Labs  07/30/14 0430 07/31/14 0500 08/01/14 0434  08/03/14 0450  09/25/14 1043 09/26/14 0358 09/27/14 0515 09/28/14 0506  INR 2.44* 1.90* 1.63*  < > 1.84*  < > 1.50* 1.77* 1.97* 1.69*  APTT 35 31 35  --  31  --   --   --   --   --   < > = values in this interval not displayed.  BMP:  Recent Labs  08/07/14 0405 08/08/14 0441  08/18/14 1320 09/08/14 1238 09/25/14 1043 09/26/14 0358  NA 134* 135  < > 136 139 141 137  K 3.5 3.4*  < > 4.0 3.8 3.7 3.0*  CL 103 105  --  103  --   --  108  CO2 23 24  < > 24 20* 21* 23  GLUCOSE 72 75  < > 72 78 83 89  BUN 6 5*  < > 5* 8.6 7.8  8  CALCIUM 7.0* 7.1*  < > 7.9* 8.4 8.3* 7.8*  CREATININE 0.44* 0.37*  < > 0.68 0.5* 0.5* 0.35*  GFRNONAA >90 >90  --  >90  --   --  >60  GFRAA >90 >90  --  >90  --   --  >60  < > = values in this interval not displayed.  LIVER FUNCTION TESTS:  Recent Labs  08/14/14 1049 08/18/14 1320 09/08/14 1238 09/25/14 1043  BILITOT 1.21* 0.8 1.11 1.06  AST 38* 40* 36* 31  ALT 17  20 22 15   ALKPHOS 162* 152* 166* 130  PROT 5.4* 6.2 6.4 6.7  ALBUMIN 2.1* 2.4* 2.7* 2.7*    TUMOR MARKERS:  Recent Labs  06/05/14 8270  CEA 2.1    Assessment and Plan: History of breast cancer s/p mastectomy  Ovarian cancer s/p TAH/BSO 06/2014 S/p radiation and chemotherapy PAF on chronic coumadin LLE swelling x 1 week Bilateral extensive DVT with mobile thrombus in setting of malignancy, now on IV heparin and coumadin, INR 1.69 IR seen to evaluate for IVC filter and catheter directed thrombolysis with possible thrombectomy, angioplasty and stenting.  Patient without any contraindications to lysis, CT head reviewed, will order fibrinogen, type and screen and am labs NPO after midnight, hold coumadin, continue IV heparin Patient to be transferred to [facility] for admission for lysis procedure and post procedure care CHF, EF 45-50% on echo 07/2014   Risks and Benefits of thrombolysis discussed with the patient and her husband today including, but not limited to bleeding, possible life threatening bleeding and need for blood product transfusion, vascular injury, stroke and infection. All questions were answered, patient is agreeable to proceed. Consent signed and in chart.  Risks and Benefits of IVC filter discussed with the patient and her husband including, but not limited to bleeding, infection, contrast induced renal failure, filter fracture or migration which can lead to emergency surgery or even death, strut penetration with damage or irritation to adjacent structures and caval thrombosis. All questions were answered, patient is agreeable to proceed. Consent signed and in chart.    Thank you for this interesting consult.  I greatly enjoyed meeting [name] and look forward to participating in their care.  SignedHedy Jacob 09/28/2014, 4:19 PM   I spent a total of 40 Minutes in face to face in clinical consultation, greater than 50% of which was  counseling/coordinating care for extensive bilateral DVT

## 2014-09-28 NOTE — Progress Notes (Signed)
ADMISSIONS TEAM NOTIFIED OF PATIENT'S ARRIVAL TO UNIT FROM [place].  PATIENT PLACED ON TELE. VITALS OBTAINED. ASSESSMENT PERFORMED.  PATIENT REMINDED THAT SHE WILL BE NPO AT MIDNIGHT. MEAL REFUSED.  HUSBAND AT BEDSIDE. PATIENT INSTRUCTED TO CALL FOR ASSISTANCE WHEN NEEDED.

## 2014-09-28 NOTE — Progress Notes (Signed)
ANTICOAGULATION CONSULT NOTE - Follow Up Consult  Pharmacy Consult for Heparin, warfarin Indication: DVT  Allergies  Allergen Reactions  . Olmesartan Medoxomil Cough    REACTION: ? if cough  . Venlafaxine Other (See Comments)    REACTION: severe dry mouth  . Adhesive [Tape] Rash  . Tetracycline Hcl Other (See Comments)    Unknown reaction, too long for patient to remember     Patient Measurements: Height: 5\' 5"  (165.1 cm) Weight: 158 lb 15.2 oz (72.1 kg) IBW/kg (Calculated) : 57 Heparin Dosing Weight: 69kg  Vital Signs: Temp: 98.1 F (36.7 C) (05/19 0546) Temp Source: Oral (05/19 0546) BP: 130/58 mmHg (05/19 0546) Pulse Rate: 76 (05/19 0546)  Labs:  Recent Labs  09/25/14 1043 09/25/14 1043  09/26/14 0358  09/27/14 0515 09/27/14 1400 09/27/14 1955 09/28/14 0506  HGB 12.0  --   < > 10.7*  --  10.7*  --   --  11.1*  HCT 36.9  --   --  33.0*  --  34.0*  --   --  34.6*  PLT 138*  --   --  128*  --  136*  --   --  158  LABPROT  --   --   --  20.8*  --  22.6*  --   --   --   INR 1.50*  --   --  1.77*  --  1.97*  --   --   --   HEPARINUNFRC  --   --   < >  --   < > 0.22* 0.34 0.44  --   CREATININE  --  0.5*  --  0.35*  --   --   --   --   --   < > = values in this interval not displayed.  Estimated Creatinine Clearance: 74.4 mL/min (by C-G formula based on Cr of 0.35).   Medications:  Infusions:  . heparin 1,500 Units/hr (09/27/14 1938)    Assessment: 59 yo F presented to ED on 09/25/2014 with bilateral DVTs in lower extremities.  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  PMH also includes DVTs and PAF for which she is on chronic warfarin therapy.  Her INR on admission  is SUBtherapeutic at 1.50.  Pharmacy has been consulted to dose heparin and warfarin  Home warfarin: 4.5 mg daily except 3 mg MWF (recently increased to this dose on 5/9 for INR 1.8 based on [facility] visit notes)  Today, 09/28/2014:  Day #4/5  minimum warfarin/heparin overlap  INR decreased to 1.69, subtherapeutic after warfarin 4.5mg  x2, 3mg  x1  Heparin level 0.53, remains therapeutic  CBC: Hgb low/improved at 11.1, Plt WNL  No reported bleeding  Drug-drug interactions: none significant  Diet: regular.  Pt reports eating well, but no green leafy vegetables.  Goal of Therapy:  INR 2-3 Heparin level 0.3-0.7 units/ml Monitor platelets by anticoagulation protocol: Yes   Plan:   Continue heparin IV infusion at 1500 units/hr  Warfarin 5mg  PO today at 1800 x1  Daily heparin level, INR, and CBC  Continue to monitor H&H and platelets   Gretta Arab PharmD, BCPS Pager [phone] 09/28/2014 7:13 AM

## 2014-09-29 ENCOUNTER — Other Ambulatory Visit: Payer: Self-pay

## 2014-09-29 ENCOUNTER — Ambulatory Visit: Payer: Self-pay

## 2014-09-29 ENCOUNTER — Ambulatory Visit: Payer: Self-pay | Admitting: Nurse Practitioner

## 2014-09-29 ENCOUNTER — Inpatient Hospital Stay (HOSPITAL_COMMUNITY): Payer: BLUE CROSS/BLUE SHIELD

## 2014-09-29 DIAGNOSIS — C50911 Malignant neoplasm of unspecified site of right female breast: Secondary | ICD-10-CM

## 2014-09-29 DIAGNOSIS — D696 Thrombocytopenia, unspecified: Secondary | ICD-10-CM

## 2014-09-29 DIAGNOSIS — Z79811 Long term (current) use of aromatase inhibitors: Secondary | ICD-10-CM

## 2014-09-29 DIAGNOSIS — I5042 Chronic combined systolic (congestive) and diastolic (congestive) heart failure: Secondary | ICD-10-CM

## 2014-09-29 DIAGNOSIS — I480 Paroxysmal atrial fibrillation: Secondary | ICD-10-CM

## 2014-09-29 LAB — CBC
HEMATOCRIT: 37.3 % (ref 36.0–46.0)
Hemoglobin: 12.3 g/dL (ref 12.0–15.0)
MCH: 31.8 pg (ref 26.0–34.0)
MCHC: 33 g/dL (ref 30.0–36.0)
MCV: 96.4 fL (ref 78.0–100.0)
Platelets: 184 10*3/uL (ref 150–400)
RBC: 3.87 MIL/uL (ref 3.87–5.11)
RDW: 16.7 % — ABNORMAL HIGH (ref 11.5–15.5)
WBC: 4.3 10*3/uL (ref 4.0–10.5)

## 2014-09-29 LAB — HEPARIN LEVEL (UNFRACTIONATED): Heparin Unfractionated: 0.43 IU/mL (ref 0.30–0.70)

## 2014-09-29 LAB — PROTIME-INR
INR: 1.65 — AB (ref 0.00–1.49)
PROTHROMBIN TIME: 19.5 s — AB (ref 11.6–15.2)

## 2014-09-29 MED ORDER — ALPRAZOLAM 0.5 MG PO TABS
0.5000 mg | ORAL_TABLET | Freq: Four times a day (QID) | ORAL | Status: DC
  Administered 2014-09-29 – 2014-10-02 (×13): 0.5 mg via ORAL
  Filled 2014-09-29 (×13): qty 1

## 2014-09-29 MED ORDER — LIDOCAINE HCL 1 % IJ SOLN
INTRAMUSCULAR | Status: AC
  Filled 2014-09-29: qty 20

## 2014-09-29 MED ORDER — MIDAZOLAM HCL 2 MG/2ML IJ SOLN
INTRAMUSCULAR | Status: AC
  Filled 2014-09-29: qty 2

## 2014-09-29 MED ORDER — FENTANYL CITRATE (PF) 100 MCG/2ML IJ SOLN
INTRAMUSCULAR | Status: AC | PRN
  Administered 2014-09-29: 50 ug via INTRAVENOUS

## 2014-09-29 MED ORDER — FENTANYL CITRATE (PF) 100 MCG/2ML IJ SOLN
INTRAMUSCULAR | Status: AC
  Filled 2014-09-29: qty 2

## 2014-09-29 NOTE — Progress Notes (Signed)
[name]   DOB:[birth year]   MH#:[id number]   XQJ#:[id number]  Subjective: a little disappointed that procedure couldn't be started today but feeling it's "better to do it right." Eating well. Good BMs. No cough, phlegm production, pleurisy or hemptysis. Husband in room   Objective: middle aged White woman examined in bed Filed Vitals:   09/29/14 1435  BP: 109/57  Pulse: 71  Temp: 97.8 F (36.6 C)  Resp: 16    Body mass index is 26.41 kg/(m^2).  Intake/Output Summary (Last 24 hours) at 09/29/14 1748 Last data filed at 09/29/14 1600  Gross per 24 hour  Intake    483 ml  Output    975 ml  Net   -492 ml     Sclerae unicteric  No peripheral adenopathy  Lungs no rales or rhonchi--auscultated anterolaterally  Heart regular rate and rhythm  Abdomen benign  Breast exam: deferred  CBG (last 3)  No results for input(s): GLUCAP in the last 72 hours.   Labs:  Lab Results  Component Value Date   WBC 4.3 09/29/2014   HGB 12.3 09/29/2014   HCT 37.3 09/29/2014   MCV 96.4 09/29/2014   PLT 184 09/29/2014   NEUTROABS 3.9 09/25/2014    '@LASTCHEMISTRY' @  Urine Studies No results for input(s): UHGB, CRYS in the last 72 hours.  Invalid input(s): UACOL, UAPR, USPG, UPH, UTP, UGL, UKET, UBIL, UNIT, UROB, ULEU, UEPI, UWBC, URBC, UBAC, CAST, UCOM, BILUA  Basic Metabolic Panel:  Recent Labs Lab 09/25/14 1043 09/26/14 0358  NA 141 137  K 3.7 3.0*  CL  --  108  CO2 21* 23  GLUCOSE 83 89  BUN 7.8 8  CREATININE 0.5* 0.35*  CALCIUM 8.3* 7.8*   GFR Estimated Creatinine Clearance: 74.4 mL/min (by C-G formula based on Cr of 0.35). Liver Function Tests:  Recent Labs Lab 09/25/14 1043  AST 31  ALT 15  ALKPHOS 130  BILITOT 1.06  PROT 6.7  ALBUMIN 2.7*   No results for input(s): LIPASE, AMYLASE in the last 168 hours. No results for input(s): AMMONIA in the last 168 hours. Coagulation profile  Recent Labs Lab 09/25/14 1043 09/26/14 0358 09/27/14 0515 09/28/14 0506  09/29/14 0931  INR 1.50* 1.77* 1.97* 1.69* 1.65*  PROTIME 18.0*  --   --   --   --     CBC:  Recent Labs Lab 09/25/14 1043 09/26/14 0358 09/27/14 0515 09/28/14 0506 09/29/14 0931  WBC 5.2 4.3 4.0 4.1 4.3  NEUTROABS 3.9  --   --   --   --   HGB 12.0 10.7* 10.7* 11.1* 12.3  HCT 36.9 33.0* 34.0* 34.6* 37.3  MCV 96.9 97.1 97.7 98.0 96.4  PLT 138* 128* 136* 158 184   Cardiac Enzymes: No results for input(s): CKTOTAL, CKMB, CKMBINDEX, TROPONINI in the last 168 hours. BNP: Invalid input(s): POCBNP CBG: No results for input(s): GLUCAP in the last 168 hours. D-Dimer No results for input(s): DDIMER in the last 72 hours. Hgb A1c No results for input(s): HGBA1C in the last 72 hours. Lipid Profile No results for input(s): CHOL, HDL, LDLCALC, TRIG, CHOLHDL, LDLDIRECT in the last 72 hours. Thyroid function studies No results for input(s): TSH, T4TOTAL, T3FREE, THYROIDAB in the last 72 hours.  Invalid input(s): FREET3 Anemia work up No results for input(s): VITAMINB12, FOLATE, FERRITIN, TIBC, IRON, RETICCTPCT in the last 72 hours. Microbiology Recent Results (from the past 240 hour(s))  MRSA PCR Screening     Status: None   Collection Time:  09/25/14  8:35 PM  Result Value Ref Range Status   MRSA by PCR NEGATIVE NEGATIVE Final    Comment:        The GeneXpert MRSA Assay (FDA approved for NASAL specimens only), is one component of a comprehensive MRSA colonization surveillance program. It is not intended to diagnose MRSA infection nor to guide or monitor treatment for MRSA infections.       Studies:  Ct Head Wo Contrast  09/28/2014   CLINICAL DATA:  Extensive deep venous thrombosis bilateral legs. History ovarian and breast cancer.  EXAM: CT HEAD WITHOUT CONTRAST  TECHNIQUE: Contiguous axial images were obtained from the base of the skull through the vertex without intravenous contrast.  COMPARISON:  CT head 09/08/2012  FINDINGS: Moderate atrophy has progressed since the  prior study. Ventricle size remains within normal limits.  Negative for acute infarct. Negative for hemorrhage or mass lesion. No suggestion of metastatic disease on unenhanced imaging  Atherosclerotic calcification  No skull lesion.  IMPRESSION: Progressive atrophy since 2014.  No acute abnormality.   Electronically Signed   By: Franchot Gallo M.D.   On: 09/28/2014 16:58    Assessment: 59 y.o. [name] admitted with bilateral DVTs in setting of subtherapeutic INR  (1) status post right mastectomy 06/08/2012 for a pT3, pN1a, stage IIIA invasive ductal carcinoma, grade 2, estrogen and progesterone receptor positive, HER-2/neu negative, with an MIB-1 of 20%.   (2) treated in the adjuvant setting with docetaxel/ cyclophosphamide given every 3 weeks. There were multiple and severe complications, and the patient tolerated only two cycles, last dose 07/29/2012  (3) letrozole started 08/16/2012 (a) osteopenia, with a T score of -1.14 April 2013  (4) postmastectomy radiation completed 12/24/2012  (5) comorbidities include diabetes, hypertension, chronic liver disease with cirrhosis and fatty liver, and nutritional disturbance with temporarily dependence on PEG feeds (discontinued July 2014).  (6) restaging studies January 2016 show (a) 8 mm and 4 mm Right middle lobe lung nodules, not metabolically active on PET -- will require follow-up (b) hypermetabolic 25.0 cm mixed solid/cystic lesion in upper pelvis, with associated adenopathy  (7) status post exploratory laparotomy 06/27/2014 with total abdominal hysterectomy, bilateral salpingo-oophorectomy, para-aortic lymphadenectomy, omentectomy and radical tumor debulking for a clear cell ovarian cancer, pT1c pN1, stage IIIC .  (8) adjuvant chemotherapy consisting of carboplatin and paclitaxel given weekly days 1 and 8 of each 21 day cycle, for 6-8 cycles as tolerated, started 07/17/2014, with onpro  support (a) day 8 cycle 2 omitted and further treatment delayed because of an episode of Klebsiella sepsis requiring intensive care unit admission  (9) starting abraxane 10/06/2014, to be repeated every 14 days  Plan:  [name] is in good spirits and looking forward to thrombolysis this weekend.  I discussed anticoagulaton options with her and her husband. They are agreeable to switching to lovenox at discharge. This may require precert through her insurance and I will ask SW to address.  I will wait on chemo until she is fully recovered but tentatively have her scheduled for therapy 10/06/2014  Greatly appreciate your help to this patient!   Chauncey Cruel, MD 09/29/2014  5:48 PM Medical Oncology and Hematology [facility] [address] [place], [place] [address] Tel. [phone]    Fax. [phone]

## 2014-09-29 NOTE — Progress Notes (Signed)
Utilization review completed.  

## 2014-09-29 NOTE — Progress Notes (Signed)
Progress Note   [name] QMG:[id number] DOB: 12-10-1955 DOA: 09/25/2014 PCP: [name], NP   Brief Narrative:   [name] is an 59 y.o. 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  The DVT on the right is at high risk for embolization, and an IVC filter was planned to be placed, but after discussing case with IR, Dr. Jana Hakim has opted to pursue the recommendation to perform thrombolysis.    Assessment/Plan:   Principal problem:  Acute bilateral lower extremity DVT - In the setting of subtherapeutic INR and malignancy despite reported compliance with warfarin. - RLE clot high risk for embolism, initially planned to have IVC filter placed, but IR recommends thrombolysis instead. - Continue heparin, hold coumadin with plans for thrombolysis 09/29/14 per IR of RLE clot. - Resume coumadin post thrombolysis when OK with IR. - Continue Oxycodone when necessary pain.  Active problems: Paroxysmal atrial fibrillation - Presently sinus rhythm and rate controlled - CAHDS-VASc=4.  Continue heparin.  Resume coumadin post thrombolysis.  - Continue metoprolol tartrate for rate control.  Ovarian carcinoma  - Status post exp laparotomy with hysterectomy, BSO, and debulking 06/27/2014--Dr. Denman George and chemotherapy. -continue outpatient oncology follow up  Right Breast Cancer - Followed by Dr. Jana Hakim.   -Continue Femara. - Chemotherapy postponed until 10/06/14. -will follow any rec's from oncology service  Depression/anxiety - Continue Lexapro, Clonopin, alprazolam, and PRN clonazepam.   Chronic systolic and diastolic CHF  - Clinically compensated at this time.  - 08/02/2014  echocardiogram showed EF 45-50 percent. - Continue home dose furosemide. -daily weights and strict intake and output   Hypertension -Controlled on metoprolol. 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 - Monitor for signs of bleeding.  Hypokalemia -Supplemented. -will follow electrolytes in am  Anemia of chronic disease, malignancy -Hemoglobin stable with no current indication for transfusion. -will monitor Hgb trend   Code Status: Full.  Family Communication: Spouse, [name] updated at bedside Disposition Plan: Discharge home when hemodynamically stable post thrombolysis, and INR therapeutic.    IV Access:    Porta-Cath   Procedures and diagnostic studies:   Ct Head Wo Contrast  09/28/2014   CLINICAL DATA:  Extensive deep venous thrombosis bilateral legs. History ovarian and breast cancer.  EXAM: CT HEAD WITHOUT CONTRAST  TECHNIQUE: Contiguous axial images were obtained from the base of the skull through the vertex without intravenous contrast.  COMPARISON:  CT head 09/08/2012  FINDINGS: Moderate atrophy has progressed since the prior study. Ventricle size remains within normal limits.  Negative for acute infarct. Negative for hemorrhage or mass lesion. No suggestion of metastatic disease on unenhanced imaging  Atherosclerotic calcification  No skull lesion.  IMPRESSION: Progressive atrophy since 2014.  No acute abnormality.   Electronically Signed   By: Franchot Gallo M.D.   On: 09/28/2014 16:58     Medical Consultants:    Dr. Tressa Busman, Oncology  IR  Anti-Infectives:    None.  Subjective:   [name] feels okl. Appetite good. No nausea or vomiting. No shortness of breath or CP. She is afebrile. Main complaint is LE pain (with palpation and movement)     Objective:    Filed Vitals:  09/29/14 1002 09/29/14 1122 09/29/14 1138 09/29/14 1435  BP: 121/61 125/104 126/69 109/57  Pulse: 76 73 70 71   Temp:    97.8 F (36.6 C)  TempSrc:    Oral  Resp:  15 12 16   Height:      Weight:      SpO2:  100% 100% 100%    Intake/Output Summary (Last 24 hours) at 09/29/14 1535 Last data filed at 09/29/14 0800  Gross per 24 hour  Intake    243 ml  Output   1175 ml  Net   -932 ml    Exam: Gen:  NAD, pale with alopecia; no fever and denying CP, nausea and vomiting Cardiovascular:  RRR, II/VI SEM; no JVD Respiratory:  Lungs CTAB Gastrointestinal:  Abdomen soft, NT/ND, + BS Extremities:  Left leg massively swollen and tender to palpation   Data Reviewed:    Labs: Basic Metabolic Panel:  Recent Labs Lab 09/25/14 1043 09/26/14 0358  NA 141 137  K 3.7 3.0*  CL  --  108  CO2 21* 23  GLUCOSE 83 89  BUN 7.8 8  CREATININE 0.5* 0.35*  CALCIUM 8.3* 7.8*   Liver Function Tests:  Recent Labs Lab 09/25/14 1043  AST 31  ALT 15  ALKPHOS 130  BILITOT 1.06  PROT 6.7  ALBUMIN 2.7*   Coagulation profile  Recent Labs Lab 09/25/14 1043 09/26/14 0358 09/27/14 0515 09/28/14 0506 09/29/14 0931  INR 1.50* 1.77* 1.97* 1.69* 1.65*  PROTIME 18.0*  --   --   --   --     CBC:  Recent Labs Lab 09/25/14 1043 09/26/14 0358 09/27/14 0515 09/28/14 0506 09/29/14 0931  WBC 5.2 4.3 4.0 4.1 4.3  NEUTROABS 3.9  --   --   --   --   HGB 12.0 10.7* 10.7* 11.1* 12.3  HCT 36.9 33.0* 34.0* 34.6* 37.3  MCV 96.9 97.1 97.7 98.0 96.4  PLT 138* 128* 136* 158 184   Microbiology Recent Results (from the past 240 hour(s))  MRSA PCR Screening     Status: None   Collection Time: 09/25/14  8:35 PM  Result Value Ref Range Status   MRSA by PCR NEGATIVE NEGATIVE Final    Comment:        The GeneXpert MRSA Assay (FDA approved for NASAL specimens only), is one component of a comprehensive MRSA colonization surveillance program. It is not intended to diagnose MRSA infection nor to guide or monitor treatment for MRSA infections.      Medications:   . ALPRAZolam  0.5 mg Oral 4 times  per day  . buPROPion  150 mg Oral Daily  . escitalopram  20 mg Oral Daily  . feeding supplement (ENSURE ENLIVE)  237 mL Oral BID BM  . furosemide  20 mg Oral Daily  . letrozole  2.5 mg Oral Daily  . levothyroxine  150 mcg Oral QAC breakfast  . metoprolol  50 mg Oral BID  . OLANZapine  2.5 mg Oral QHS  . pneumococcal 23 valent vaccine  0.5 mL Intramuscular Tomorrow-1000  . polyethylene glycol  17 g Oral Daily  . potassium chloride  10 mEq Oral BID  . saccharomyces boulardii  250 mg Oral BID  . sodium chloride  3 mL Intravenous Q12H  . Warfarin - Pharmacist Dosing Inpatient   Does not apply q1800   Continuous Infusions: . heparin 1,500 Units/hr (09/29/14 0704)    Time spent: 30 minutes with > 50% of time discussing current diagnostic  test results, clinical impression and plan of care with the patient and her husband at bedside.   LOS: 4 days   Barton Dubois  [facility] Pager [phone]   *Please refer to [email or web address], password [email or web address] to get updated schedule on who will round on this patient, as hospitalists switch teams weekly. If 7PM-7AM, please contact night-coverage at [email or web address], password [email or web address] for any overnight needs.  09/29/2014, 3:35 PM

## 2014-09-29 NOTE — Progress Notes (Signed)
ANTICOAGULATION CONSULT NOTE - Follow Up Consult  Pharmacy Consult for Heparin and Coumadin Indication: atrial fibrillation and DVT  Allergies  Allergen Reactions  . Olmesartan Medoxomil Cough    REACTION: ? if cough  . Venlafaxine Other (See Comments)    REACTION: severe dry mouth  . Adhesive [Tape] Rash  . Tetracycline Hcl Other (See Comments)    Unknown reaction, too long for patient to remember     Patient Measurements: Height: 5\' 5"  (165.1 cm) Weight: 158 lb 11.7 oz (72 kg) IBW/kg (Calculated) : 57 Heparin Dosing Weight: 69 kg  Vital Signs: Temp: 97.8 F (36.6 C) (05/20 1435) Temp Source: Oral (05/20 1435) BP: 109/57 mmHg (05/20 1435) Pulse Rate: 71 (05/20 1435)  Labs:  Recent Labs  09/27/14 0515  09/27/14 1955 09/28/14 0506 09/29/14 0931  HGB 10.7*  --   --  11.1* 12.3  HCT 34.0*  --   --  34.6* 37.3  PLT 136*  --   --  158 184  LABPROT 22.6*  --   --  20.0* 19.5*  INR 1.97*  --   --  1.69* 1.65*  HEPARINUNFRC 0.22*  < > 0.44 0.53 0.43  < > = values in this interval not displayed.  Estimated Creatinine Clearance: 74.4 mL/min (by C-G formula based on Cr of 0.35).  Assessment:   59 yo F presented to ED on 09/25/2014 with bilateral DVTs in lower extremities. 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 PMH also includes DVTs and PAF for which she is on chronic warfarin therapy. Her INR on admissionwas subtherapeutic at 1.50. Pharmacy has been consulted to dose heparin and warfarin  Home warfarin: 4.5 mg daily except 3 mg MWF (recently increased to this dose on 5/9 for INR 1.8 based on [facility] visit notes)  Transferred from [place] to [facility] for thrombolysis in IR, and possible IVC filter placement.  To IR earlier today, but now back on the floor.  RN reports procedure rescheduled for 09/30/14. Heparin level is therapeutic (0.43) on 1500 units/hr.  INR 1.65. Coumadin held on 5/19 for procedure.  Goal  of Therapy:  INR 2-3 Heparin level 0.3-0.7 units/ml Monitor platelets by anticoagulation protocol: Yes   Plan:   Continue heparin drip at 1500 units/hr.  Hold Coumadin again today.  Daily heparin level, PT/INR and CBC.  Will follow up for anticoagulation plans s/p IR procedure on 09/30/14.  Arty Baumgartner, [place] Pager: [phone] 09/29/2014,4:14 PM

## 2014-09-30 ENCOUNTER — Inpatient Hospital Stay (HOSPITAL_COMMUNITY): Payer: BLUE CROSS/BLUE SHIELD

## 2014-09-30 DIAGNOSIS — I5032 Chronic diastolic (congestive) heart failure: Secondary | ICD-10-CM

## 2014-09-30 LAB — BASIC METABOLIC PANEL
ANION GAP: 9 (ref 5–15)
BUN: 6 mg/dL (ref 6–20)
CALCIUM: 8.5 mg/dL — AB (ref 8.9–10.3)
CO2: 20 mmol/L — ABNORMAL LOW (ref 22–32)
Chloride: 109 mmol/L (ref 101–111)
Creatinine, Ser: 0.42 mg/dL — ABNORMAL LOW (ref 0.44–1.00)
GFR calc Af Amer: >60 mL/min (ref >60)
GFR calc non Af Amer: >60 mL/min (ref >60)
Glucose, Bld: 121 mg/dL — ABNORMAL HIGH (ref 65–99)
Potassium: 3.9 mmol/L (ref 3.5–5.1)
SODIUM: 138 mmol/L (ref 135–145)

## 2014-09-30 LAB — CBC
HCT: 34.8 % — ABNORMAL LOW (ref 36.0–46.0)
HEMOGLOBIN: 11.3 g/dL — AB (ref 12.0–15.0)
MCH: 31.6 pg (ref 26.0–34.0)
MCHC: 32.5 g/dL (ref 30.0–36.0)
MCV: 97.2 fL (ref 78.0–100.0)
Platelets: 173 10*3/uL (ref 150–400)
RBC: 3.58 MIL/uL — ABNORMAL LOW (ref 3.87–5.11)
RDW: 16.5 % — ABNORMAL HIGH (ref 11.5–15.5)
WBC: 4.6 10*3/uL (ref 4.0–10.5)

## 2014-09-30 LAB — HEPARIN LEVEL (UNFRACTIONATED): Heparin Unfractionated: 0.43 IU/mL (ref 0.30–0.70)

## 2014-09-30 LAB — PROTIME-INR
INR: 1.61 — AB (ref 0.00–1.49)
PROTHROMBIN TIME: 19.2 s — AB (ref 11.6–15.2)

## 2014-09-30 MED ORDER — FENTANYL CITRATE (PF) 100 MCG/2ML IJ SOLN
INTRAMUSCULAR | Status: AC
  Filled 2014-09-30: qty 2

## 2014-09-30 MED ORDER — FENTANYL CITRATE (PF) 100 MCG/2ML IJ SOLN
INTRAMUSCULAR | Status: AC | PRN
  Administered 2014-09-30: 50 ug via INTRAVENOUS
  Administered 2014-09-30: 25 ug via INTRAVENOUS

## 2014-09-30 MED ORDER — MIDAZOLAM HCL 2 MG/2ML IJ SOLN
INTRAMUSCULAR | Status: AC | PRN
  Administered 2014-09-30: 1 mg via INTRAVENOUS

## 2014-09-30 MED ORDER — MIDAZOLAM HCL 2 MG/2ML IJ SOLN
INTRAMUSCULAR | Status: AC
  Filled 2014-09-30: qty 2

## 2014-09-30 MED ORDER — IOHEXOL 300 MG/ML  SOLN
100.0000 mL | Freq: Once | INTRAMUSCULAR | Status: AC | PRN
  Administered 2014-09-30: 70 mL via INTRAVENOUS

## 2014-09-30 MED ORDER — ALTEPLASE 2 MG IJ SOLR
Freq: Once | INTRAMUSCULAR | Status: DC
  Filled 2014-09-30: qty 1000

## 2014-09-30 MED ORDER — SODIUM CHLORIDE 0.9 % IV SOLN
INTRAVENOUS | Status: AC | PRN
  Administered 2014-09-30: 10 mL/h via INTRAVENOUS

## 2014-09-30 MED ORDER — LORAZEPAM 2 MG/ML IJ SOLN
INTRAMUSCULAR | Status: AC | PRN
  Administered 2014-09-30: 0.5 mg via INTRAVENOUS

## 2014-09-30 MED ORDER — LIDOCAINE HCL 1 % IJ SOLN
INTRAMUSCULAR | Status: AC
  Filled 2014-09-30: qty 20

## 2014-09-30 NOTE — Procedures (Signed)
Post-Procedure Note  Pre-operative Diagnosis:  Bilateral lower extremity DVT.  Left side symptomatic       Post-operative Diagnosis: Same   Indications:  Left calf pain and swelling  Procedure Details:  US demonstrated thrombus in left common femoral vein, femoral vein and left popliteal vein.  Attempted to access left posterior tibial veins but unsuccessful.  Left popliteal vein was accessed with [name] and venogram confirmed thrombus in left femoral vein.  Rapid Lysis technique with TPA and AngioJet was performed in left leg.  Vascular sheath removed after procedure.    Findings: DVT in left lower extremity from Left CFV to popliteal vein.  Thrombus in left posterior tibial veins. Following Rapid Lysis technique, the left femoral vein and left common femoral vein were patent.  Small amount of residual thrombus remaining.   US demonstrated DVT in right femoral vein.  Minimal clot identified in right common femoral vein.  Complications: None     Condition: Stable  Plan: Bedrest and re-start heparin.   Follow leg symptoms

## 2014-09-30 NOTE — Sedation Documentation (Signed)
Patient denies pain and is resting comfortably.  

## 2014-09-30 NOTE — Progress Notes (Signed)
ANTICOAGULATION CONSULT NOTE - Follow Up Consult  Pharmacy Consult for Heparin Indication: atrial fibrillation and DVT  Allergies  Allergen Reactions  . Olmesartan Medoxomil Cough    REACTION: ? if cough  . Venlafaxine Other (See Comments)    REACTION: severe dry mouth  . Adhesive [Tape] Rash  . Tetracycline Hcl Other (See Comments)    Unknown reaction, too long for patient to remember     Patient Measurements: Height: 5\' 5"  (165.1 cm) Weight: 156 lb 1.4 oz (70.8 kg) IBW/kg (Calculated) : 57 Heparin Dosing Weight: 69 kg  Vital Signs: Temp: 98.3 F (36.8 C) (05/21 0438) Temp Source: Oral (05/21 0438) BP: 148/71 mmHg (05/21 1500) Pulse Rate: 71 (05/21 1500)  Labs:  Recent Labs  09/28/14 0506 09/29/14 0931 09/30/14 0408 09/30/14 0500  HGB 11.1* 12.3 11.3*  --   HCT 34.6* 37.3 34.8*  --   PLT 158 184 173  --   LABPROT 20.0* 19.5* 19.2*  --   INR 1.69* 1.65* 1.61*  --   HEPARINUNFRC 0.53 0.43  --  0.43  CREATININE  --   --  0.42*  --     Estimated Creatinine Clearance: 73.8 mL/min (by C-G formula based on Cr of 0.42).  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 PMH also includes DVTs and PAF for which she is on chronic warfarin therapy. She is s/p thrombolysis this morning, will continue IV heparin and plan for long-term lovenox after discharge. hepairn level 0.43 on 1500 units/hr. CBC stable.  Goal of Therapy:  INR 2-3 Heparin level 0.3-0.7 units/ml Monitor platelets by anticoagulation protocol: Yes   Plan:  - Continue heparin drip at 1500 units/hr. - Daily heparin level, PT/INR and CBC. - Will follow up for switching to lovenox (recommend 100 mg daily or 70 mg BID)  Maryanna Shape, PharmD, BCPS  Clinical Pharmacist  Pager: [phone]   09/30/2014,3:26 PM

## 2014-09-30 NOTE — Progress Notes (Signed)
Progress Note   [name] TKP:[id number] DOB: 06-16-1955 DOA: 09/25/2014 PCP: [name], NP   Brief Narrative:   [name] is an 59 y.o. 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  The DVT on the right is at high risk for embolization, and an IVC filter was planned to be placed, but after discussing case with IR, Dr. Jana Hakim has opted to pursue the recommendation to perform thrombolysis.    Assessment/Plan:   Principal problem:  Acute bilateral lower extremity DVT - In the setting of subtherapeutic INR and malignancy despite reported compliance with warfarin. - RLE clot high risk for embolism, initially planned to have IVC filter placed, but IR recommends thrombolysis instead. - Continue heparin - looking for potential use of lovenox as anticoagulant agent; case manager to check insurance coverage and copays - Continue Oxycodone when necessary pain.  Active problems: Paroxysmal atrial fibrillation - Presently sinus rhythm and rate controlled - CAHDS-VASc=4.  Continue heparin.   - Continue metoprolol tartrate for rate control. -looking for potential use of lovenox as anticoagulant agent; case manager to check insurance coverage and copays  Ovarian carcinoma  -Status post exp laparotomy with hysterectomy, BSO, and debulking 06/27/2014--Dr. Denman George and chemotherapy. -continue outpatient oncology follow up  Right Breast Cancer -Followed by Dr. Jana Hakim.   -Continue Femara. -Chemotherapy postponed until 10/06/14. -will follow any rec's from oncology service  Depression/anxiety - Continue Lexapro, Clonopin, alprazolam, and PRN clonazepam.   Chronic systolic and diastolic  CHF  - Clinically compensated at this time.  - 08/02/2014 echocardiogram showed EF 45-50 percent. - Continue home dose furosemide. -daily weights and strict intake and output   Hypertension -Controlled on metoprolol. 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 - Monitor for signs of bleeding.  Hypokalemia -Supplemented. -will follow electrolytes in am  Anemia of chronic disease, malignancy -Hemoglobin stable with no current indication for transfusion. -will monitor Hgb trend   Code Status: Full.  Family Communication: Spouse, [name] updated at bedside Disposition Plan: Discharge home when hemodynamically stable post thrombolysis, and INR therapeutic.    IV Access:    Porta-Cath   Procedures and diagnostic studies:   Ct Head Wo Contrast  09/28/2014   CLINICAL DATA:  Extensive deep venous thrombosis bilateral legs. History ovarian and breast cancer.  EXAM: CT HEAD WITHOUT CONTRAST  TECHNIQUE: Contiguous axial images were obtained from the base of the skull through the vertex without intravenous contrast.  COMPARISON:  CT head 09/08/2012  FINDINGS: Moderate atrophy has progressed since the prior study. Ventricle size remains within normal limits.  Negative for acute infarct. Negative for hemorrhage or mass lesion. No suggestion of metastatic disease on unenhanced imaging  Atherosclerotic calcification  No skull lesion.  IMPRESSION: Progressive atrophy since 2014.  No acute abnormality.   Electronically Signed   By: Franchot Gallo M.D.   On: 09/28/2014 16:58     Medical Consultants:    Dr. Tressa Busman, Oncology  IR  Anti-Infectives:    None.  Subjective:   [name] feels ok. Appetite good. No nausea or vomiting. No shortness of breath or CP. Plan is for thrombolysis today.  Objective:    Filed Vitals:   09/29/14  1435 09/29/14 2032 09/30/14 0438 09/30/14 1106  BP: 109/57 128/67 120/62 132/63  Pulse: 71  78 73 71  Temp: 97.8 F (36.6 C) 99.4 F (37.4 C) 98.3 F (36.8 C)   TempSrc: Oral Oral Oral   Resp: 16 18 18 18   Height:      Weight:   70.8 kg (156 lb 1.4 oz)   SpO2: 100% 96% 97% 99%    Intake/Output Summary (Last 24 hours) at 09/30/14 1131 Last data filed at 09/30/14 0900  Gross per 24 hour  Intake    480 ml  Output    675 ml  Net   -195 ml    Exam: Gen:  NAD, pale with alopecia; no fever and denying CP, nausea and vomiting. Slight disappointed that the procedure couldn't be done yesterday, but optimistic about having thrombolysis today and if possible have both legs done.  Cardiovascular:  RRR, II/VI SEM; no JVD Respiratory:  Lungs CTAB Gastrointestinal:  Abdomen soft, NT/ND, + BS Extremities:  Left leg massively swollen and tender to palpation   Data Reviewed:    Labs: Basic Metabolic Panel:  Recent Labs Lab 09/25/14 1043 09/26/14 0358 09/30/14 0408  NA 141 137 138  K 3.7 3.0* 3.9  CL  --  108 109  CO2 21* 23 20*  GLUCOSE 83 89 121*  BUN 7.8 8 6   CREATININE 0.5* 0.35* 0.42*  CALCIUM 8.3* 7.8* 8.5*   Liver Function Tests:  Recent Labs Lab 09/25/14 1043  AST 31  ALT 15  ALKPHOS 130  BILITOT 1.06  PROT 6.7  ALBUMIN 2.7*   Coagulation profile  Recent Labs Lab 09/25/14 1043 09/26/14 0358 09/27/14 0515 09/28/14 0506 09/29/14 0931 09/30/14 0408  INR 1.50* 1.77* 1.97* 1.69* 1.65* 1.61*  PROTIME 18.0*  --   --   --   --   --     CBC:  Recent Labs Lab 09/25/14 1043 09/26/14 0358 09/27/14 0515 09/28/14 0506 09/29/14 0931 09/30/14 0408  WBC 5.2 4.3 4.0 4.1 4.3 4.6  NEUTROABS 3.9  --   --   --   --   --   HGB 12.0 10.7* 10.7* 11.1* 12.3 11.3*  HCT 36.9 33.0* 34.0* 34.6* 37.3 34.8*  MCV 96.9 97.1 97.7 98.0 96.4 97.2  PLT 138* 128* 136* 158 184 173   Microbiology Recent Results (from the past 240 hour(s))  MRSA PCR Screening     Status: None   Collection Time: 09/25/14  8:35 PM  Result Value Ref Range Status   MRSA by PCR NEGATIVE  NEGATIVE Final    Comment:        The GeneXpert MRSA Assay (FDA approved for NASAL specimens only), is one component of a comprehensive MRSA colonization surveillance program. It is not intended to diagnose MRSA infection nor to guide or monitor treatment for MRSA infections.      Medications:   . ALPRAZolam  0.5 mg Oral 4 times per day  . Alteplase 25mg  in 0.9% sodium chloride 1055mL   Intravenous Once  . buPROPion  150 mg Oral Daily  . escitalopram  20 mg Oral Daily  . feeding supplement (ENSURE ENLIVE)  237 mL Oral BID BM  . fentaNYL      . furosemide  20 mg Oral Daily  . letrozole  2.5 mg Oral Daily  . levothyroxine  150 mcg Oral QAC breakfast  . lidocaine      . metoprolol  50 mg Oral BID  . midazolam      .  OLANZapine  2.5 mg Oral QHS  . pneumococcal 23 valent vaccine  0.5 mL Intramuscular Tomorrow-1000  . polyethylene glycol  17 g Oral Daily  . potassium chloride  10 mEq Oral BID  . saccharomyces boulardii  250 mg Oral BID  . sodium chloride  3 mL Intravenous Q12H   Continuous Infusions: . sodium chloride 10 mL/hr (09/30/14 1049)  . heparin 1,500 Units/hr (09/30/14 0148)    Time spent: 30 minutes with > 50% of time discussing current diagnostic test results, clinical impression and plan of care with the patient and her husband at bedside.   LOS: 5 days   Barton Dubois  [facility] Pager [phone]   *Please refer to [email or web address], password [email or web address] to get updated schedule on who will round on this patient, as hospitalists switch teams weekly. If 7PM-7AM, please contact night-coverage at [email or web address], password [email or web address] for any overnight needs.  09/30/2014, 11:31 AM

## 2014-10-01 ENCOUNTER — Other Ambulatory Visit: Payer: Self-pay | Admitting: Oncology

## 2014-10-01 ENCOUNTER — Inpatient Hospital Stay (INDEPENDENT_AMBULATORY_CARE_PROVIDER_SITE_OTHER): Payer: BLUE CROSS/BLUE SHIELD

## 2014-10-01 DIAGNOSIS — Z86718 Personal history of other venous thrombosis and embolism: Secondary | ICD-10-CM | POA: Diagnosis not present

## 2014-10-01 LAB — CBC
HCT: 35.7 % — ABNORMAL LOW (ref 36.0–46.0)
Hemoglobin: 11.5 g/dL — ABNORMAL LOW (ref 12.0–15.0)
MCH: 30.9 pg (ref 26.0–34.0)
MCHC: 32.2 g/dL (ref 30.0–36.0)
MCV: 96 fL (ref 78.0–100.0)
Platelets: 196 10*3/uL (ref 150–400)
RBC: 3.72 MIL/uL — AB (ref 3.87–5.11)
RDW: 16.6 % — ABNORMAL HIGH (ref 11.5–15.5)
WBC: 4.9 10*3/uL (ref 4.0–10.5)

## 2014-10-01 LAB — PROTIME-INR
INR: 1.4 (ref 0.00–1.49)
Prothrombin Time: 17.3 seconds — ABNORMAL HIGH (ref 11.6–15.2)

## 2014-10-01 LAB — HEPARIN LEVEL (UNFRACTIONATED): Heparin Unfractionated: 0.29 IU/mL — ABNORMAL LOW (ref 0.30–0.70)

## 2014-10-01 MED ORDER — DOCUSATE SODIUM 100 MG PO CAPS
100.0000 mg | ORAL_CAPSULE | Freq: Two times a day (BID) | ORAL | Status: DC
  Administered 2014-10-01 – 2014-10-02 (×2): 100 mg via ORAL
  Filled 2014-10-01 (×3): qty 1

## 2014-10-01 MED ORDER — SODIUM CHLORIDE 0.9 % IJ SOLN
10.0000 mL | INTRAMUSCULAR | Status: DC | PRN
  Administered 2014-10-02: 10 mL
  Filled 2014-10-01: qty 40

## 2014-10-01 NOTE — Progress Notes (Signed)
*  Preliminary Results* Bilateral lower extremity venous duplex completed.  The right lower extremity is positive for acute deep and superficial vein thrombosis involving the right common femoral, femoral, profunda femoral, popliteal, posterior tibial, and greater saphenous veins. When compared to the prior study from 09/28/14, the thrombus in the right common femoral vein does not appear to be as mobile.  The left lower extremity is positive for acute deep vein thrombosis involving the right saphenofemoral junction, common femoral, femoral, profunda femoral, and popliteal veins. There is flow within the left common femoral vein, however the left popliteal vein appears to be occluded.  There is no evidence of Baker's cyst bilaterally.  Preliminary results discussed with Dr. Dyann Kief.  10/01/2014  Maudry Mayhew, RVT, RDCS, RDMS

## 2014-10-01 NOTE — Progress Notes (Signed)
Late Entry: Patient places themselves on CPAP and will call if any help needed

## 2014-10-01 NOTE — Progress Notes (Signed)
Pt ambulated 200 feet in hallway with rolling walker; pt to chair, then [facility] upon arrival back to room; call bell w/i reach; will cont. To monitor.

## 2014-10-01 NOTE — Progress Notes (Signed)
Progress Note   JORETTA EADS UQJ:[id number] DOB: [birth year] DOA: 09/25/2014 PCP: [name], NP   Brief Narrative:   [name] is an 59 y.o. 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  The DVT on the right is at high risk for embolization, and an IVC filter was planned to be placed, but after discussing case with IR, Dr. Jana Hakim has opted to pursue the recommendation to perform thrombolysis.    Assessment/Plan:   Principal problem:  Acute bilateral lower extremity DVT - In the setting of subtherapeutic INR and malignancy despite reported compliance with warfarin. - RLE clot high risk for embolism not seen by IR with repeated venous doppler; thrombolysis on LLE done on 5/21. Will repeat right LE duplex to reassess for clots. - Continue heparin per pharmacy for now - looking for potential use of lovenox as anticoagulant agent; case manager to check insurance coverage and copays. Ok per IR to resume anticoagulation. - Continue Oxycodone when necessary pain.  Paroxysmal atrial fibrillation - Presently sinus rhythm and rate controlled - CAHDS-VASc=4.  Continue heparin.   - Continue metoprolol tartrate for rate control. -looking for potential use of lovenox as anticoagulant agent; case manager to check insurance coverage and copays  Ovarian carcinoma  -Status post exp laparotomy with hysterectomy, BSO, and debulking 06/27/2014--Dr. Denman George and chemotherapy. -continue outpatient oncology follow up  Right Breast Cancer -Followed by Dr. Jana Hakim.   -Continue Femara. -Chemotherapy postponed until 10/06/14. -will follow any rec's from oncology service  Depression/anxiety - Continue  Lexapro, Clonopin, alprazolam, and PRN clonazepam.   Chronic systolic and diastolic CHF  - Clinically compensated at this time.  - 08/02/2014 echocardiogram showed EF 45-50 percent. - Continue home dose furosemide. -daily weights and strict intake and output   Hypertension -Controlled on metoprolol. 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 - Monitor for signs of bleeding.  Hypokalemia -Supplemented. -K WNL  Anemia of chronic disease, malignancy -Hemoglobin stable with no current indication for transfusion. -will monitor Hgb trend   Code Status: Full.  Family Communication: Spouse, [name] updated at bedside Disposition Plan: Discharge home when hemodynamically stable post thrombolysis; most likely home tomorrow.   IV Access:    Porta-Cath   Procedures and diagnostic studies:   Ct Head Wo Contrast  09/28/2014   CLINICAL DATA:  Extensive deep venous thrombosis bilateral legs. History ovarian and breast cancer.  EXAM: CT HEAD WITHOUT CONTRAST  TECHNIQUE: Contiguous axial images were obtained from the base of the skull through the vertex without intravenous contrast.  COMPARISON:  CT head 09/08/2012  FINDINGS: Moderate atrophy has progressed since the prior study. Ventricle size remains within normal limits.  Negative for acute infarct. Negative for hemorrhage or mass lesion. No suggestion of metastatic disease on unenhanced imaging  Atherosclerotic calcification  No skull lesion.  IMPRESSION: Progressive atrophy since 2014.  No acute abnormality.   Electronically Signed   By: Franchot Gallo M.D.   On: 09/28/2014 16:58   Ir Veno/ext/uni Left  09/30/2014   CLINICAL DATA:  59 year old female with bilateral lower extremity DVT and history of ovarian cancer. The patient is currently anticoagulated but continues to have pain in the left calf. Plan for left  lower extremity venogram and possible thrombolysis and mechanical  thrombectomy.  EXAM: LEFT LOWER EXTREMITY VENOGRAPHY; MECHANICAL VENOUS THROMBECTOMY ; ULTRASOUND GUIDANCE FOR VASCULAR ACCESS  Physician: Stephan Minister. Henn, MD  FLUOROSCOPY TIME:  15 minutes and 24 seconds, 354 mGy  MEDICATIONS: 1.5 mg Versed, 75 mcg fentanyl. A radiology nurse monitored the patient for moderate sedation.  ANESTHESIA/SEDATION: Moderate sedation time: 120 minutes  PROCEDURE: The procedure was explained to the patient. Venography, thrombolysis, mechanical thrombectomy and IVC filter placement were discussed with the patient. The risks and benefits of the procedure were discussed and the patient's questions were addressed. Informed consent was obtained from the patient.  The patient has lower extremities were evaluated with ultrasound. Patient's primary symptom is pain and swelling in the left calf. Thrombus in the left popliteal vein was identified. Intended to treat the left popliteal thrombus via a posterior tibial vein. As a result, the left ankle and left popliteal region were prepped and draped in sterile fashion. Maximal barrier sterile technique was utilized including caps, mask, sterile gowns, sterile gloves, sterile drape, hand hygiene and skin antiseptic. Patient was in a prone position.  Ultrasound demonstrated small thrombosed posterior tibial veins at the ankle. This area was anesthetized with 1% lidocaine. Multiple attempts were made to cannulate these thrombosed posterior tibial veins with ultrasound guidance. A wire could not be successfully advanced up the calf. As a result, attention was directed to the left popliteal vein. The left popliteal vein was identified with ultrasound. Popliteal fossa was anesthetized with 1% lidocaine. 21 gauge needle was directed into the occluded popliteal vein with ultrasound guidance and a micropuncture dilator set was placed. Eventually, 6 French vascular sheath was placed and a 5 French catheter was advanced up the left leg. Venography demonstrated  thrombus in the left thigh. Catheter was advanced into the left iliac veins and iliac/IVC venography was performed.  Attention was directed to performing mechanical thrombectomy of the thrombus in the left common femoral vein and left femoral vein. 6 [place] vascular sheath was exchanged for an 8 [place] vascular sheath. The Angiojet device and an 8 [place] Mach 1 Guide Catheter were advanced over the wire position in the left common femoral vein. Mechanical thrombectomy was performed using the Rapid Lysis technique. 25 mg TPA was combined with 1 liter of normal saline for the mechanical thrombectomy. Approximately 375 mL of fluid was used for this procedure. The Angiojet and Guide Catheter were pulled down to the upper popliteal vein region while spinning the Guide Catheter. Follow-up venography was performed. Residual areas of thrombus in the left femoral vein and the left common femoral vein were identified. These areas were treated again with the Angiojet device. Follow-up venography demonstrated patency of the left common femoral vein and left femoral vein with a small amount residual clot. The vascular sheath was removed with manual compression.  FINDINGS: Right lower extremity: Ultrasound demonstrated thrombus in the right femoral vein. Minimal clot identified in the right common femoral vein.  Left lower extremity: Thrombus in the left popliteal vein and left posterior tibial veins identified with ultrasound. Unable to successfully cannulate a thrombosed posterior tibial veins with ultrasound. The left popliteal vein was successfully cannulated. Venography demonstrated clot in left common femoral vein and left femoral vein. Left iliac veins and IVC are patent. Following mechanical thrombectomy, the left common femoral vein and left femoral vein were patent with small amount of nonocclusive thrombus. The left iliac veins and IVC were also patent at the end of the procedure.  Estimated blood loss: Minimal   COMPLICATIONS: None  IMPRESSION: Successful mechanical thrombectomy of the deep vein thrombus in the left femoral vein and left common femoral vein. These vessels were patent at the end of the procedure with a small amount of nonocclusive thrombus.  The left popliteal vein thrombus was not treated with mechanical thrombectomy because of difficulty accessing the posterior tibial veins. If the left calf swelling does not improve, we could try to access the posterior tibial veins again.  History of mobile clot in the right common femoral vein. No significant thrombus was identified in the right common femoral vein today. Recommend repeating a venous duplex of this area to confirm this finding.   Electronically Signed   By: Markus Daft M.D.   On: 09/30/2014 19:46   Ir Thrombect Prim Mech Add (inclu) Mod Sed  09/30/2014   CLINICAL DATA:  59 year old female with bilateral lower extremity DVT and history of ovarian cancer. The patient is currently anticoagulated but continues to have pain in the left calf. Plan for left lower extremity venogram and possible thrombolysis and mechanical thrombectomy.  EXAM: LEFT LOWER EXTREMITY VENOGRAPHY; MECHANICAL VENOUS THROMBECTOMY ; ULTRASOUND GUIDANCE FOR VASCULAR ACCESS  Physician: Stephan Minister. Henn, MD  FLUOROSCOPY TIME:  15 minutes and 24 seconds, 354 mGy  MEDICATIONS: 1.5 mg Versed, 75 mcg fentanyl. A radiology nurse monitored the patient for moderate sedation.  ANESTHESIA/SEDATION: Moderate sedation time: 120 minutes  PROCEDURE: The procedure was explained to the patient. Venography, thrombolysis, mechanical thrombectomy and IVC filter placement were discussed with the patient. The risks and benefits of the procedure were discussed and the patient's questions were addressed. Informed consent was obtained from the patient.  The patient has lower extremities were evaluated with ultrasound. Patient's primary symptom is pain and swelling in the left calf. Thrombus in the left popliteal  vein was identified. Intended to treat the left popliteal thrombus via a posterior tibial vein. As a result, the left ankle and left popliteal region were prepped and draped in sterile fashion. Maximal barrier sterile technique was utilized including caps, mask, sterile gowns, sterile gloves, sterile drape, hand hygiene and skin antiseptic. Patient was in a prone position.  Ultrasound demonstrated small thrombosed posterior tibial veins at the ankle. This area was anesthetized with 1% lidocaine. Multiple attempts were made to cannulate these thrombosed posterior tibial veins with ultrasound guidance. A wire could not be successfully advanced up the calf. As a result, attention was directed to the left popliteal vein. The left popliteal vein was identified with ultrasound. Popliteal fossa was anesthetized with 1% lidocaine. 21 gauge needle was directed into the occluded popliteal vein with ultrasound guidance and a micropuncture dilator set was placed. Eventually, 6 French vascular sheath was placed and a 5 French catheter was advanced up the left leg. Venography demonstrated thrombus in the left thigh. Catheter was advanced into the left iliac veins and iliac/IVC venography was performed.  Attention was directed to performing mechanical thrombectomy of the thrombus in the left common femoral vein and left femoral vein. 6 [place] vascular sheath was exchanged for an 8 [place] vascular sheath. The Angiojet device and an 8 [place] Mach 1 Guide Catheter were advanced over the wire position in the left common femoral vein. Mechanical thrombectomy was performed using the Rapid Lysis technique. 25 mg TPA was combined with 1 liter of normal saline for the mechanical thrombectomy. Approximately 375 mL of fluid was used for this procedure. The Angiojet and Guide Catheter were pulled down to the  upper popliteal vein region while spinning the Guide Catheter. Follow-up venography was performed. Residual areas of thrombus in the  left femoral vein and the left common femoral vein were identified. These areas were treated again with the Angiojet device. Follow-up venography demonstrated patency of the left common femoral vein and left femoral vein with a small amount residual clot. The vascular sheath was removed with manual compression.  FINDINGS: Right lower extremity: Ultrasound demonstrated thrombus in the right femoral vein. Minimal clot identified in the right common femoral vein.  Left lower extremity: Thrombus in the left popliteal vein and left posterior tibial veins identified with ultrasound. Unable to successfully cannulate a thrombosed posterior tibial veins with ultrasound. The left popliteal vein was successfully cannulated. Venography demonstrated clot in left common femoral vein and left femoral vein. Left iliac veins and IVC are patent. Following mechanical thrombectomy, the left common femoral vein and left femoral vein were patent with small amount of nonocclusive thrombus. The left iliac veins and IVC were also patent at the end of the procedure.  Estimated blood loss: Minimal  COMPLICATIONS: None  IMPRESSION: Successful mechanical thrombectomy of the deep vein thrombus in the left femoral vein and left common femoral vein. These vessels were patent at the end of the procedure with a small amount of nonocclusive thrombus.  The left popliteal vein thrombus was not treated with mechanical thrombectomy because of difficulty accessing the posterior tibial veins. If the left calf swelling does not improve, we could try to access the posterior tibial veins again.  History of mobile clot in the right common femoral vein. No significant thrombus was identified in the right common femoral vein today. Recommend repeating a venous duplex of this area to confirm this finding.   Electronically Signed   By: Markus Daft M.D.   On: 09/30/2014 19:46   Ir US Guide Vasc Access Left  09/30/2014   CLINICAL DATA:  59 year old female with  bilateral lower extremity DVT and history of ovarian cancer. The patient is currently anticoagulated but continues to have pain in the left calf. Plan for left lower extremity venogram and possible thrombolysis and mechanical thrombectomy.  EXAM: LEFT LOWER EXTREMITY VENOGRAPHY; MECHANICAL VENOUS THROMBECTOMY ; ULTRASOUND GUIDANCE FOR VASCULAR ACCESS  Physician: Stephan Minister. Henn, MD  FLUOROSCOPY TIME:  15 minutes and 24 seconds, 354 mGy  MEDICATIONS: 1.5 mg Versed, 75 mcg fentanyl. A radiology nurse monitored the patient for moderate sedation.  ANESTHESIA/SEDATION: Moderate sedation time: 120 minutes  PROCEDURE: The procedure was explained to the patient. Venography, thrombolysis, mechanical thrombectomy and IVC filter placement were discussed with the patient. The risks and benefits of the procedure were discussed and the patient's questions were addressed. Informed consent was obtained from the patient.  The patient has lower extremities were evaluated with ultrasound. Patient's primary symptom is pain and swelling in the left calf. Thrombus in the left popliteal vein was identified. Intended to treat the left popliteal thrombus via a posterior tibial vein. As a result, the left ankle and left popliteal region were prepped and draped in sterile fashion. Maximal barrier sterile technique was utilized including caps, mask, sterile gowns, sterile gloves, sterile drape, hand hygiene and skin antiseptic. Patient was in a prone position.  Ultrasound demonstrated small thrombosed posterior tibial veins at the ankle. This area was anesthetized with 1% lidocaine. Multiple attempts were made to cannulate these thrombosed posterior tibial veins with ultrasound guidance. A wire could not be successfully advanced up the calf. As a result, attention was directed  to the left popliteal vein. The left popliteal vein was identified with ultrasound. Popliteal fossa was anesthetized with 1% lidocaine. 21 gauge needle was directed into  the occluded popliteal vein with ultrasound guidance and a micropuncture dilator set was placed. Eventually, 6 French vascular sheath was placed and a 5 French catheter was advanced up the left leg. Venography demonstrated thrombus in the left thigh. Catheter was advanced into the left iliac veins and iliac/IVC venography was performed.  Attention was directed to performing mechanical thrombectomy of the thrombus in the left common femoral vein and left femoral vein. 6 [place] vascular sheath was exchanged for an 8 [place] vascular sheath. The Angiojet device and an 8 [place] Mach 1 Guide Catheter were advanced over the wire position in the left common femoral vein. Mechanical thrombectomy was performed using the Rapid Lysis technique. 25 mg TPA was combined with 1 liter of normal saline for the mechanical thrombectomy. Approximately 375 mL of fluid was used for this procedure. The Angiojet and Guide Catheter were pulled down to the upper popliteal vein region while spinning the Guide Catheter. Follow-up venography was performed. Residual areas of thrombus in the left femoral vein and the left common femoral vein were identified. These areas were treated again with the Angiojet device. Follow-up venography demonstrated patency of the left common femoral vein and left femoral vein with a small amount residual clot. The vascular sheath was removed with manual compression.  FINDINGS: Right lower extremity: Ultrasound demonstrated thrombus in the right femoral vein. Minimal clot identified in the right common femoral vein.  Left lower extremity: Thrombus in the left popliteal vein and left posterior tibial veins identified with ultrasound. Unable to successfully cannulate a thrombosed posterior tibial veins with ultrasound. The left popliteal vein was successfully cannulated. Venography demonstrated clot in left common femoral vein and left femoral vein. Left iliac veins and IVC are patent. Following mechanical  thrombectomy, the left common femoral vein and left femoral vein were patent with small amount of nonocclusive thrombus. The left iliac veins and IVC were also patent at the end of the procedure.  Estimated blood loss: Minimal  COMPLICATIONS: None  IMPRESSION: Successful mechanical thrombectomy of the deep vein thrombus in the left femoral vein and left common femoral vein. These vessels were patent at the end of the procedure with a small amount of nonocclusive thrombus.  The left popliteal vein thrombus was not treated with mechanical thrombectomy because of difficulty accessing the posterior tibial veins. If the left calf swelling does not improve, we could try to access the posterior tibial veins again.  History of mobile clot in the right common femoral vein. No significant thrombus was identified in the right common femoral vein today. Recommend repeating a venous duplex of this area to confirm this finding.   Electronically Signed   By: Markus Daft M.D.   On: 09/30/2014 19:46     Medical Consultants:    Dr. Tressa Busman, Oncology  IR  Anti-Infectives:    None.  Subjective:   SHIMIKA AMES feeling much better, denies CP and SOB. Patient is S/P LLE thrombectomy with rapid Lysis on 5/21; having less swelling and less pain.  Objective:    Filed Vitals:   09/30/14 1600 09/30/14 1958 10/01/14 0417 10/01/14 1340  BP: 136/67 123/58 116/76 115/68  Pulse: 73 74 77 77  Temp:  99.1 F (37.3 C) 98.2 F (36.8 C) 97.7 F (36.5 C)  TempSrc:  Oral Oral Oral  Resp: 18 18 18  19  Height:      Weight:   70.1 kg (154 lb 8.7 oz)   SpO2: 100% 96% 98% 100%    Intake/Output Summary (Last 24 hours) at 10/01/14 1349 Last data filed at 10/01/14 1300  Gross per 24 hour  Intake 1675.25 ml  Output    350 ml  Net 1325.25 ml    Exam: Gen:  NAD, afebrile; feeling better and reporting less pain and swelling in her LLE. Cardiovascular:  RRR, II/VI SEM; no JVD Respiratory:  Lungs  CTAB Gastrointestinal:  Abdomen soft, NT/ND, + BS Extremities:  Left leg with less pain and significant improvement in swelling   Data Reviewed:    Labs: Basic Metabolic Panel:  Recent Labs Lab 09/25/14 1043 09/26/14 0358 09/30/14 0408  NA 141 137 138  K 3.7 3.0* 3.9  CL  --  108 109  CO2 21* 23 20*  GLUCOSE 83 89 121*  BUN 7.8 8 6   CREATININE 0.5* 0.35* 0.42*  CALCIUM 8.3* 7.8* 8.5*   Liver Function Tests:  Recent Labs Lab 09/25/14 1043  AST 31  ALT 15  ALKPHOS 130  BILITOT 1.06  PROT 6.7  ALBUMIN 2.7*   Coagulation profile  Recent Labs Lab 09/25/14 1043  09/27/14 0515 09/28/14 0506 09/29/14 0931 09/30/14 0408 10/01/14 0555  INR 1.50*  < > 1.97* 1.69* 1.65* 1.61* 1.40  PROTIME 18.0*  --   --   --   --   --   --   < > = values in this interval not displayed.  CBC:  Recent Labs Lab 09/25/14 1043  09/27/14 0515 09/28/14 0506 09/29/14 0931 09/30/14 0408 10/01/14 0555  WBC 5.2  < > 4.0 4.1 4.3 4.6 4.9  NEUTROABS 3.9  --   --   --   --   --   --   HGB 12.0  < > 10.7* 11.1* 12.3 11.3* 11.5*  HCT 36.9  < > 34.0* 34.6* 37.3 34.8* 35.7*  MCV 96.9  < > 97.7 98.0 96.4 97.2 96.0  PLT 138*  < > 136* 158 184 173 196  < > = values in this interval not displayed. Microbiology Recent Results (from the past 240 hour(s))  MRSA PCR Screening     Status: None   Collection Time: 09/25/14  8:35 PM  Result Value Ref Range Status   MRSA by PCR NEGATIVE NEGATIVE Final    Comment:        The GeneXpert MRSA Assay (FDA approved for NASAL specimens only), is one component of a comprehensive MRSA colonization surveillance program. It is not intended to diagnose MRSA infection nor to guide or monitor treatment for MRSA infections.      Medications:   . ALPRAZolam  0.5 mg Oral 4 times per day  . Alteplase 25mg  in 0.9% sodium chloride 1032mL   Intravenous Once  . buPROPion  150 mg Oral Daily  . escitalopram  20 mg Oral Daily  . feeding supplement (ENSURE  ENLIVE)  237 mL Oral BID BM  . furosemide  20 mg Oral Daily  . letrozole  2.5 mg Oral Daily  . levothyroxine  150 mcg Oral QAC breakfast  . metoprolol  50 mg Oral BID  . OLANZapine  2.5 mg Oral QHS  . pneumococcal 23 valent vaccine  0.5 mL Intramuscular Tomorrow-1000  . polyethylene glycol  17 g Oral Daily  . potassium chloride  10 mEq Oral BID  . saccharomyces boulardii  250 mg Oral BID  . sodium chloride  3 mL Intravenous Q12H   Continuous Infusions: . heparin 1,550 Units/hr (10/01/14 1306)   Time spent: 30 minutes with > 50% of time discussing current diagnostic test results, clinical impression and plan of care with the patient and her husband at bedside.   LOS: 6 days   Barton Dubois  [facility] Pager [phone]   *Please refer to [email or web address], password [email or web address] to get updated schedule on who will round on this patient, as hospitalists switch teams weekly. If 7PM-7AM, please contact night-coverage at [email or web address], password [email or web address] for any overnight needs.  10/01/2014, 1:49 PM

## 2014-10-01 NOTE — Progress Notes (Signed)
ANTICOAGULATION CONSULT NOTE - Follow Up Consult  Pharmacy Consult for Heparin Indication: atrial fibrillation and DVT  Allergies  Allergen Reactions  . Olmesartan Medoxomil Cough    REACTION: ? if cough  . Venlafaxine Other (See Comments)    REACTION: severe dry mouth  . Adhesive [Tape] Rash  . Tetracycline Hcl Other (See Comments)    Unknown reaction, too long for patient to remember     Patient Measurements: Height: 5\' 5"  (165.1 cm) Weight: 154 lb 8.7 oz (70.1 kg) IBW/kg (Calculated) : 57 Heparin Dosing Weight: 69 kg  Vital Signs: Temp: 98.2 F (36.8 C) (05/22 0417) Temp Source: Oral (05/22 0417) BP: 116/76 mmHg (05/22 0417) Pulse Rate: 77 (05/22 0417)  Labs:  Recent Labs  09/29/14 0931 09/30/14 0408 09/30/14 0500 10/01/14 0555  HGB 12.3 11.3*  --  11.5*  HCT 37.3 34.8*  --  35.7*  PLT 184 173  --  196  LABPROT 19.5* 19.2*  --  17.3*  INR 1.65* 1.61*  --  1.40  HEPARINUNFRC 0.43  --  0.43 0.29*  CREATININE  --  0.42*  --   --     Estimated Creatinine Clearance: 73.4 mL/min (by C-G formula based on Cr of 0.42).  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 PMH also includes DVTs and PAF for which she is on chronic warfarin therapy. She is s/p thrombolysis 5/21, will continue IV heparin and plan for long-term lovenox after discharge. hepairn level 0.29, slightly subtherapeutic on 1500 units/hr. CBC stable.  Goal of Therapy:  Heparin level 0.3-0.7 units/ml Monitor platelets by anticoagulation protocol: Yes   Plan:  - Increase heparin drip slightly to 1550 units/hr. - Daily heparin level, PT/INR and CBC. - Will follow up for switching to lovenox (recommend 100 mg daily or 70 mg BID)  Maryanna Shape, PharmD, BCPS  Clinical Pharmacist  Pager: [phone]   10/01/2014,1:00 PM

## 2014-10-01 NOTE — Progress Notes (Signed)
Referring Physician(s):  Magrinat  Subjective:  LLE venous thrombectomy with rapid lysis technique in IR 5/21 Good result [name] did not show any clot in Rt lower extremity Pt feels better Less pain in LLE Less swelling  Allergies: Olmesartan medoxomil; Venlafaxine; Adhesive; and Tetracycline hcl  Medications: Prior to Admission medications   Medication Sig Start Date End Date Taking? Authorizing Provider  ALPRAZolam (XANAX XR) 1 MG 24 hr tablet TAKE 1 TABLET BY MOUTH TWICE DAILY 09/18/14  Yes Chauncey Cruel, MD  buPROPion (WELLBUTRIN XL) 150 MG 24 hr tablet Take 1 tablet (150 mg total) by mouth daily. 09/15/14  Yes Debbrah Alar, NP  clonazePAM (KLONOPIN) 0.5 MG tablet TAKE 1/2 TABLET BY MOUTH TWICE DAILY AS NEEDED Patient taking differently: TAKE 1/2 TABLET BY MOUTH TWICE DAILY 09/18/14  Yes Debbrah Alar, NP  escitalopram (LEXAPRO) 20 MG tablet Take 20 mg by mouth daily.   Yes Historical Provider, MD  famotidine (PEPCID) 20 MG tablet Take 1 tablet (20 mg total) by mouth daily. Patient taking differently: Take 20 mg by mouth daily as needed.  08/08/14  Yes Modena Jansky, MD  furosemide (LASIX) 20 MG tablet Take 1 tablet (20 mg total) by mouth daily. 05/23/14  Yes Debbrah Alar, NP  letrozole (FEMARA) 2.5 MG tablet Take 1 tablet (2.5 mg total) by mouth daily. 05/22/14  Yes Chauncey Cruel, MD  levothyroxine (SYNTHROID, LEVOTHROID) 150 MCG tablet Take 1 tablet (150 mcg total) by mouth daily. 05/23/14  Yes Debbrah Alar, NP  lidocaine-prilocaine (EMLA) cream Apply over port area 1-2 hours before chemotherapy 07/14/14  Yes Chauncey Cruel, MD  metoprolol (LOPRESSOR) 50 MG tablet Take 1 tablet (50 mg total) by mouth 2 (two) times daily. 08/25/14  Yes Debbrah Alar, NP  Nutritional Supplements (ENSURE HIGH PROTEIN PO) Take 237 mLs by mouth 2 (two) times daily.   Yes Historical Provider, MD  OLANZapine (ZYPREXA) 2.5 MG tablet Take 2.5 mg by mouth at bedtime.   Yes  Historical Provider, MD  ondansetron (ZOFRAN) 8 MG tablet Take 1 tablet (8 mg total) by mouth 2 (two) times daily. Start the day after chemo for 2 days. Then take as needed for nausea or vomiting. 09/25/14  Yes Chauncey Cruel, MD  polyethylene glycol (MIRALAX / GLYCOLAX) packet Take 17 g by mouth daily as needed for moderate constipation.   Yes Historical Provider, MD  polyethylene glycol (MIRALAX / GLYCOLAX) packet Take 17 g by mouth daily. 09/04/14  Yes Charlesetta Shanks, MD  potassium chloride (K-DUR) 10 MEQ tablet Take 10 mEq by mouth 2 (two) times daily. 04/18/14  Yes Historical Provider, MD  prochlorperazine (COMPAZINE) 10 MG tablet Take 1 tablet (10 mg total) by mouth every 6 (six) hours as needed (Nausea or vomiting). 09/25/14  Yes Chauncey Cruel, MD  warfarin (COUMADIN) 3 MG tablet Take 1 tablet (3 mg total) by mouth daily at 6 PM. Or as directed by coumadin clinic. Patient taking differently: Take 4.5 mg by mouth daily at 6 PM. Take 1.5 tablets (4.5 mg) daily 08/08/14  Yes Modena Jansky, MD  cephALEXin (KEFLEX) 500 MG capsule Take 1 capsule (500 mg total) by mouth 3 (three) times daily. 09/25/14   Chauncey Cruel, MD     Vital Signs: BP 116/76 mmHg  Pulse 77  Temp(Src) 98.2 F (36.8 C) (Oral)  Resp 18  Ht 5\' 5"  (1.651 m)  Wt 70.1 kg (154 lb 8.7 oz)  BMI 25.72 kg/m2  SpO2 98%  Physical Exam  Musculoskeletal:  LLE with swelling---but better than yesterday Skin is warm Leg NT Site of lysis  (behind left knee)- clean and dry NT no bleeding Pulses 1+ H/H stable afeb  Skin: Skin is warm and dry.  Left leg less swollen today No redness Sensation intact    Imaging: Ct Head Wo Contrast  09/28/2014   CLINICAL DATA:  Extensive deep venous thrombosis bilateral legs. History ovarian and breast cancer.  EXAM: CT HEAD WITHOUT CONTRAST  TECHNIQUE: Contiguous axial images were obtained from the base of the skull through the vertex without intravenous contrast.  COMPARISON:  CT  head 09/08/2012  FINDINGS: Moderate atrophy has progressed since the prior study. Ventricle size remains within normal limits.  Negative for acute infarct. Negative for hemorrhage or mass lesion. No suggestion of metastatic disease on unenhanced imaging  Atherosclerotic calcification  No skull lesion.  IMPRESSION: Progressive atrophy since 2014.  No acute abnormality.   Electronically Signed   By: Franchot Gallo M.D.   On: 09/28/2014 16:58   Ir Veno/ext/uni Left  09/30/2014   CLINICAL DATA:  59 year old female with bilateral lower extremity DVT and history of ovarian cancer. The patient is currently anticoagulated but continues to have pain in the left calf. Plan for left lower extremity venogram and possible thrombolysis and mechanical thrombectomy.  EXAM: LEFT LOWER EXTREMITY VENOGRAPHY; MECHANICAL VENOUS THROMBECTOMY ; ULTRASOUND GUIDANCE FOR VASCULAR ACCESS  Physician: Stephan Minister. Henn, MD  FLUOROSCOPY TIME:  15 minutes and 24 seconds, 354 mGy  MEDICATIONS: 1.5 mg Versed, 75 mcg fentanyl. A radiology nurse monitored the patient for moderate sedation.  ANESTHESIA/SEDATION: Moderate sedation time: 120 minutes  PROCEDURE: The procedure was explained to the patient. Venography, thrombolysis, mechanical thrombectomy and IVC filter placement were discussed with the patient. The risks and benefits of the procedure were discussed and the patient's questions were addressed. Informed consent was obtained from the patient.  The patient has lower extremities were evaluated with ultrasound. Patient's primary symptom is pain and swelling in the left calf. Thrombus in the left popliteal vein was identified. Intended to treat the left popliteal thrombus via a posterior tibial vein. As a result, the left ankle and left popliteal region were prepped and draped in sterile fashion. Maximal barrier sterile technique was utilized including caps, mask, sterile gowns, sterile gloves, sterile drape, hand hygiene and skin antiseptic.  Patient was in a prone position.  Ultrasound demonstrated small thrombosed posterior tibial veins at the ankle. This area was anesthetized with 1% lidocaine. Multiple attempts were made to cannulate these thrombosed posterior tibial veins with ultrasound guidance. A wire could not be successfully advanced up the calf. As a result, attention was directed to the left popliteal vein. The left popliteal vein was identified with ultrasound. Popliteal fossa was anesthetized with 1% lidocaine. 21 gauge needle was directed into the occluded popliteal vein with ultrasound guidance and a micropuncture dilator set was placed. Eventually, 6 French vascular sheath was placed and a 5 French catheter was advanced up the left leg. Venography demonstrated thrombus in the left thigh. Catheter was advanced into the left iliac veins and iliac/IVC venography was performed.  Attention was directed to performing mechanical thrombectomy of the thrombus in the left common femoral vein and left femoral vein. 6 [place] vascular sheath was exchanged for an 8 [place] vascular sheath. The Angiojet device and an 8 [place] Mach 1 Guide Catheter were advanced over the wire position in the left common femoral vein. Mechanical thrombectomy was performed using the  Rapid Lysis technique. 25 mg TPA was combined with 1 liter of normal saline for the mechanical thrombectomy. Approximately 375 mL of fluid was used for this procedure. The Angiojet and Guide Catheter were pulled down to the upper popliteal vein region while spinning the Guide Catheter. Follow-up venography was performed. Residual areas of thrombus in the left femoral vein and the left common femoral vein were identified. These areas were treated again with the Angiojet device. Follow-up venography demonstrated patency of the left common femoral vein and left femoral vein with a small amount residual clot. The vascular sheath was removed with manual compression.  FINDINGS: Right lower extremity:  Ultrasound demonstrated thrombus in the right femoral vein. Minimal clot identified in the right common femoral vein.  Left lower extremity: Thrombus in the left popliteal vein and left posterior tibial veins identified with ultrasound. Unable to successfully cannulate a thrombosed posterior tibial veins with ultrasound. The left popliteal vein was successfully cannulated. Venography demonstrated clot in left common femoral vein and left femoral vein. Left iliac veins and IVC are patent. Following mechanical thrombectomy, the left common femoral vein and left femoral vein were patent with small amount of nonocclusive thrombus. The left iliac veins and IVC were also patent at the end of the procedure.  Estimated blood loss: Minimal  COMPLICATIONS: None  IMPRESSION: Successful mechanical thrombectomy of the deep vein thrombus in the left femoral vein and left common femoral vein. These vessels were patent at the end of the procedure with a small amount of nonocclusive thrombus.  The left popliteal vein thrombus was not treated with mechanical thrombectomy because of difficulty accessing the posterior tibial veins. If the left calf swelling does not improve, we could try to access the posterior tibial veins again.  History of mobile clot in the right common femoral vein. No significant thrombus was identified in the right common femoral vein today. Recommend repeating a venous duplex of this area to confirm this finding.   Electronically Signed   By: Markus Daft M.D.   On: 09/30/2014 19:46   Ir Thrombect Prim Mech Add (inclu) Mod Sed  09/30/2014   CLINICAL DATA:  59 year old female with bilateral lower extremity DVT and history of ovarian cancer. The patient is currently anticoagulated but continues to have pain in the left calf. Plan for left lower extremity venogram and possible thrombolysis and mechanical thrombectomy.  EXAM: LEFT LOWER EXTREMITY VENOGRAPHY; MECHANICAL VENOUS THROMBECTOMY ; ULTRASOUND GUIDANCE FOR  VASCULAR ACCESS  Physician: Stephan Minister. Henn, MD  FLUOROSCOPY TIME:  15 minutes and 24 seconds, 354 mGy  MEDICATIONS: 1.5 mg Versed, 75 mcg fentanyl. A radiology nurse monitored the patient for moderate sedation.  ANESTHESIA/SEDATION: Moderate sedation time: 120 minutes  PROCEDURE: The procedure was explained to the patient. Venography, thrombolysis, mechanical thrombectomy and IVC filter placement were discussed with the patient. The risks and benefits of the procedure were discussed and the patient's questions were addressed. Informed consent was obtained from the patient.  The patient has lower extremities were evaluated with ultrasound. Patient's primary symptom is pain and swelling in the left calf. Thrombus in the left popliteal vein was identified. Intended to treat the left popliteal thrombus via a posterior tibial vein. As a result, the left ankle and left popliteal region were prepped and draped in sterile fashion. Maximal barrier sterile technique was utilized including caps, mask, sterile gowns, sterile gloves, sterile drape, hand hygiene and skin antiseptic. Patient was in a prone position.  Ultrasound demonstrated small thrombosed posterior tibial veins  at the ankle. This area was anesthetized with 1% lidocaine. Multiple attempts were made to cannulate these thrombosed posterior tibial veins with ultrasound guidance. A wire could not be successfully advanced up the calf. As a result, attention was directed to the left popliteal vein. The left popliteal vein was identified with ultrasound. Popliteal fossa was anesthetized with 1% lidocaine. 21 gauge needle was directed into the occluded popliteal vein with ultrasound guidance and a micropuncture dilator set was placed. Eventually, 6 French vascular sheath was placed and a 5 French catheter was advanced up the left leg. Venography demonstrated thrombus in the left thigh. Catheter was advanced into the left iliac veins and iliac/IVC venography was performed.   Attention was directed to performing mechanical thrombectomy of the thrombus in the left common femoral vein and left femoral vein. 6 [place] vascular sheath was exchanged for an 8 [place] vascular sheath. The Angiojet device and an 8 [place] Mach 1 Guide Catheter were advanced over the wire position in the left common femoral vein. Mechanical thrombectomy was performed using the Rapid Lysis technique. 25 mg TPA was combined with 1 liter of normal saline for the mechanical thrombectomy. Approximately 375 mL of fluid was used for this procedure. The Angiojet and Guide Catheter were pulled down to the upper popliteal vein region while spinning the Guide Catheter. Follow-up venography was performed. Residual areas of thrombus in the left femoral vein and the left common femoral vein were identified. These areas were treated again with the Angiojet device. Follow-up venography demonstrated patency of the left common femoral vein and left femoral vein with a small amount residual clot. The vascular sheath was removed with manual compression.  FINDINGS: Right lower extremity: Ultrasound demonstrated thrombus in the right femoral vein. Minimal clot identified in the right common femoral vein.  Left lower extremity: Thrombus in the left popliteal vein and left posterior tibial veins identified with ultrasound. Unable to successfully cannulate a thrombosed posterior tibial veins with ultrasound. The left popliteal vein was successfully cannulated. Venography demonstrated clot in left common femoral vein and left femoral vein. Left iliac veins and IVC are patent. Following mechanical thrombectomy, the left common femoral vein and left femoral vein were patent with small amount of nonocclusive thrombus. The left iliac veins and IVC were also patent at the end of the procedure.  Estimated blood loss: Minimal  COMPLICATIONS: None  IMPRESSION: Successful mechanical thrombectomy of the deep vein thrombus in the left femoral vein and  left common femoral vein. These vessels were patent at the end of the procedure with a small amount of nonocclusive thrombus.  The left popliteal vein thrombus was not treated with mechanical thrombectomy because of difficulty accessing the posterior tibial veins. If the left calf swelling does not improve, we could try to access the posterior tibial veins again.  History of mobile clot in the right common femoral vein. No significant thrombus was identified in the right common femoral vein today. Recommend repeating a venous duplex of this area to confirm this finding.   Electronically Signed   By: Markus Daft M.D.   On: 09/30/2014 19:46   Ir US Guide Vasc Access Left  09/30/2014   CLINICAL DATA:  59 year old female with bilateral lower extremity DVT and history of ovarian cancer. The patient is currently anticoagulated but continues to have pain in the left calf. Plan for left lower extremity venogram and possible thrombolysis and mechanical thrombectomy.  EXAM: LEFT LOWER EXTREMITY VENOGRAPHY; MECHANICAL VENOUS THROMBECTOMY ; ULTRASOUND GUIDANCE FOR VASCULAR  ACCESS  Physician: Stephan Minister. Henn, MD  FLUOROSCOPY TIME:  15 minutes and 24 seconds, 354 mGy  MEDICATIONS: 1.5 mg Versed, 75 mcg fentanyl. A radiology nurse monitored the patient for moderate sedation.  ANESTHESIA/SEDATION: Moderate sedation time: 120 minutes  PROCEDURE: The procedure was explained to the patient. Venography, thrombolysis, mechanical thrombectomy and IVC filter placement were discussed with the patient. The risks and benefits of the procedure were discussed and the patient's questions were addressed. Informed consent was obtained from the patient.  The patient has lower extremities were evaluated with ultrasound. Patient's primary symptom is pain and swelling in the left calf. Thrombus in the left popliteal vein was identified. Intended to treat the left popliteal thrombus via a posterior tibial vein. As a result, the left ankle and left  popliteal region were prepped and draped in sterile fashion. Maximal barrier sterile technique was utilized including caps, mask, sterile gowns, sterile gloves, sterile drape, hand hygiene and skin antiseptic. Patient was in a prone position.  Ultrasound demonstrated small thrombosed posterior tibial veins at the ankle. This area was anesthetized with 1% lidocaine. Multiple attempts were made to cannulate these thrombosed posterior tibial veins with ultrasound guidance. A wire could not be successfully advanced up the calf. As a result, attention was directed to the left popliteal vein. The left popliteal vein was identified with ultrasound. Popliteal fossa was anesthetized with 1% lidocaine. 21 gauge needle was directed into the occluded popliteal vein with ultrasound guidance and a micropuncture dilator set was placed. Eventually, 6 French vascular sheath was placed and a 5 French catheter was advanced up the left leg. Venography demonstrated thrombus in the left thigh. Catheter was advanced into the left iliac veins and iliac/IVC venography was performed.  Attention was directed to performing mechanical thrombectomy of the thrombus in the left common femoral vein and left femoral vein. 6 [place] vascular sheath was exchanged for an 8 [place] vascular sheath. The Angiojet device and an 8 [place] Mach 1 Guide Catheter were advanced over the wire position in the left common femoral vein. Mechanical thrombectomy was performed using the Rapid Lysis technique. 25 mg TPA was combined with 1 liter of normal saline for the mechanical thrombectomy. Approximately 375 mL of fluid was used for this procedure. The Angiojet and Guide Catheter were pulled down to the upper popliteal vein region while spinning the Guide Catheter. Follow-up venography was performed. Residual areas of thrombus in the left femoral vein and the left common femoral vein were identified. These areas were treated again with the Angiojet device. Follow-up  venography demonstrated patency of the left common femoral vein and left femoral vein with a small amount residual clot. The vascular sheath was removed with manual compression.  FINDINGS: Right lower extremity: Ultrasound demonstrated thrombus in the right femoral vein. Minimal clot identified in the right common femoral vein.  Left lower extremity: Thrombus in the left popliteal vein and left posterior tibial veins identified with ultrasound. Unable to successfully cannulate a thrombosed posterior tibial veins with ultrasound. The left popliteal vein was successfully cannulated. Venography demonstrated clot in left common femoral vein and left femoral vein. Left iliac veins and IVC are patent. Following mechanical thrombectomy, the left common femoral vein and left femoral vein were patent with small amount of nonocclusive thrombus. The left iliac veins and IVC were also patent at the end of the procedure.  Estimated blood loss: Minimal  COMPLICATIONS: None  IMPRESSION: Successful mechanical thrombectomy of the deep vein thrombus in the left femoral vein  and left common femoral vein. These vessels were patent at the end of the procedure with a small amount of nonocclusive thrombus.  The left popliteal vein thrombus was not treated with mechanical thrombectomy because of difficulty accessing the posterior tibial veins. If the left calf swelling does not improve, we could try to access the posterior tibial veins again.  History of mobile clot in the right common femoral vein. No significant thrombus was identified in the right common femoral vein today. Recommend repeating a venous duplex of this area to confirm this finding.   Electronically Signed   By: Markus Daft M.D.   On: 09/30/2014 19:46    Labs:  CBC:  Recent Labs  09/28/14 0506 09/29/14 0931 09/30/14 0408 10/01/14 0555  WBC 4.1 4.3 4.6 4.9  HGB 11.1* 12.3 11.3* 11.5*  HCT 34.6* 37.3 34.8* 35.7*  PLT 158 184 173 196    COAGS:  Recent  Labs  07/30/14 0430 07/31/14 0500 08/01/14 0434  08/03/14 0450  09/28/14 0506 09/29/14 0931 09/30/14 0408 10/01/14 0555  INR 2.44* 1.90* 1.63*  < > 1.84*  < > 1.69* 1.65* 1.61* 1.40  APTT 35 31 35  --  31  --   --   --   --   --   < > = values in this interval not displayed.  BMP:  Recent Labs  08/08/14 0441  08/18/14 1320 09/08/14 1238 09/25/14 1043 09/26/14 0358 09/30/14 0408  NA 135  < > 136 139 141 137 138  K 3.4*  < > 4.0 3.8 3.7 3.0* 3.9  CL 105  --  103  --   --  108 109  CO2 24  < > 24 20* 21* 23 20*  GLUCOSE 75  < > 72 78 83 89 121*  BUN 5*  < > 5* 8.6 7.8 8 6   CALCIUM 7.1*  < > 7.9* 8.4 8.3* 7.8* 8.5*  CREATININE 0.37*  < > 0.68 0.5* 0.5* 0.35* 0.42*  GFRNONAA >90  --  >90  --   --  >60 >60  GFRAA >90  --  >90  --   --  >60 >60  < > = values in this interval not displayed.  LIVER FUNCTION TESTS:  Recent Labs  08/14/14 1049 08/18/14 1320 09/08/14 1238 09/25/14 1043  BILITOT 1.21* 0.8 1.11 1.06  AST 38* 40* 36* 31  ALT 17 20 22 15   ALKPHOS 162* 152* 166* 130  PROT 5.4* 6.2 6.4 6.7  ALBUMIN 2.1* 2.4* 2.7* 2.7*    Assessment and Plan:  Post LLE venous rapid lysis 5/21 Good result Rec: compression stockings; ambulation Consider re doppler of Rt LE to ensure no clot R CFV Plan per [facility] and Dr Jana Hakim  Signed: Monia Sabal A 10/01/2014, 9:29 AM   I spent a total of 15 Minutes in face to face in clinical consultation/evaluation, greater than 50% of which was counseling/coordinating care for LLE venous rapid lysis

## 2014-10-01 NOTE — Progress Notes (Signed)
Patient places themselves on CPAP, Will cal lif need assistance.

## 2014-10-02 ENCOUNTER — Telehealth: Payer: Self-pay | Admitting: Oncology

## 2014-10-02 ENCOUNTER — Other Ambulatory Visit (HOSPITAL_COMMUNITY): Payer: Self-pay | Admitting: Radiology

## 2014-10-02 ENCOUNTER — Other Ambulatory Visit (HOSPITAL_COMMUNITY): Payer: Self-pay | Admitting: Diagnostic Radiology

## 2014-10-02 DIAGNOSIS — I82403 Acute embolism and thrombosis of unspecified deep veins of lower extremity, bilateral: Secondary | ICD-10-CM

## 2014-10-02 LAB — CBC
HCT: 34.4 % — ABNORMAL LOW (ref 36.0–46.0)
HEMOGLOBIN: 11 g/dL — AB (ref 12.0–15.0)
MCH: 30.8 pg (ref 26.0–34.0)
MCHC: 32 g/dL (ref 30.0–36.0)
MCV: 96.4 fL (ref 78.0–100.0)
Platelets: 183 10*3/uL (ref 150–400)
RBC: 3.57 MIL/uL — AB (ref 3.87–5.11)
RDW: 16.4 % — AB (ref 11.5–15.5)
WBC: 5.3 10*3/uL (ref 4.0–10.5)

## 2014-10-02 LAB — BASIC METABOLIC PANEL
ANION GAP: 9 (ref 5–15)
BUN: 6 mg/dL (ref 6–20)
CHLORIDE: 106 mmol/L (ref 101–111)
CO2: 20 mmol/L — AB (ref 22–32)
Calcium: 8.4 mg/dL — ABNORMAL LOW (ref 8.9–10.3)
Creatinine, Ser: 0.45 mg/dL (ref 0.44–1.00)
GLUCOSE: 101 mg/dL — AB (ref 65–99)
Potassium: 3.8 mmol/L (ref 3.5–5.1)
Sodium: 135 mmol/L (ref 135–145)

## 2014-10-02 LAB — PROTIME-INR
INR: 1.46 (ref 0.00–1.49)
PROTHROMBIN TIME: 17.8 s — AB (ref 11.6–15.2)

## 2014-10-02 LAB — HEPARIN LEVEL (UNFRACTIONATED): HEPARIN UNFRACTIONATED: 0.46 [IU]/mL (ref 0.30–0.70)

## 2014-10-02 MED ORDER — ENOXAPARIN SODIUM 100 MG/ML ~~LOC~~ SOLN
100.0000 mg | SUBCUTANEOUS | Status: DC
  Administered 2014-10-02: 100 mg via SUBCUTANEOUS
  Filled 2014-10-02: qty 1

## 2014-10-02 MED ORDER — DOCUSATE SODIUM 100 MG PO CAPS: 100.0000 mg | ORAL_CAPSULE | Freq: Two times a day (BID) | ORAL | Status: DC

## 2014-10-02 MED ORDER — ENOXAPARIN SODIUM 100 MG/ML ~~LOC~~ SOLN: 100.0000 mg | SUBCUTANEOUS | Status: DC

## 2014-10-02 MED ORDER — HEPARIN SOD (PORK) LOCK FLUSH 100 UNIT/ML IV SOLN
500.0000 [IU] | INTRAVENOUS | Status: DC
  Filled 2014-10-02: qty 5

## 2014-10-02 MED ORDER — OXYCODONE HCL 5 MG PO TABS: 5.0000 mg | ORAL_TABLET | Freq: Four times a day (QID) | ORAL | Status: DC | PRN

## 2014-10-02 MED ORDER — HEPARIN SOD (PORK) LOCK FLUSH 100 UNIT/ML IV SOLN
500.0000 [IU] | INTRAVENOUS | Status: DC | PRN
  Administered 2014-10-02: 500 [IU]
  Filled 2014-10-02: qty 5

## 2014-10-02 NOTE — Progress Notes (Signed)
ANTICOAGULATION CONSULT NOTE - Follow Up Consult  Pharmacy Consult for Heparin Indication: atrial fibrillation and DVT  Allergies  Allergen Reactions  . Olmesartan Medoxomil Cough    REACTION: ? if cough  . Venlafaxine Other (See Comments)    REACTION: severe dry mouth  . Adhesive [Tape] Rash  . Tetracycline Hcl Other (See Comments)    Unknown reaction, too long for patient to remember     Patient Measurements: Height: 5\' 5"  (165.1 cm) Weight: 156 lb 1.4 oz (70.8 kg) IBW/kg (Calculated) : 57 Heparin Dosing Weight: 69 kg  Vital Signs: Temp: 98.3 F (36.8 C) (05/23 0543) Temp Source: Oral (05/23 0543) BP: 120/55 mmHg (05/23 0543) Pulse Rate: 78 (05/23 0543)  Labs:  Recent Labs  09/30/14 0408 09/30/14 0500 10/01/14 0555 10/02/14 0415  HGB 11.3*  --  11.5* 11.0*  HCT 34.8*  --  35.7* 34.4*  PLT 173  --  196 183  LABPROT 19.2*  --  17.3* 17.8*  INR 1.61*  --  1.40 1.46  HEPARINUNFRC  --  0.43 0.29* 0.46  CREATININE 0.42*  --   --  0.45    Estimated Creatinine Clearance: 73.8 mL/min (by C-G formula based on Cr of 0.45).  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 PMH also includes DVTs and PAF for which she is on chronic warfarin therapy. She is s/p thrombolysis 5/21, will continue IV heparin and plan for long-term lovenox after discharge.   -Heparin level is 0.46 and at goal. Hg/hg= 11/34.4, plt= 183  Goal of Therapy:  Heparin level 0.3-0.7 units/ml Monitor platelets by anticoagulation protocol: Yes   Plan:  -No heparin changes needed -Daily heparin level, PT/INR and CBC. -Will follow up for switching to lovenox (recommend 100 mg daily or 70 mg BID)  Hildred Laser, Pharm D 10/02/2014 7:52 AM

## 2014-10-02 NOTE — Discharge Summary (Signed)
Physician Discharge Summary  [name] NWG:[id number] DOB: April 06, 1956 DOA: 09/25/2014  PCP: [name], NP  Admit date: 09/25/2014 Discharge date: 10/02/2014  Time spent: >30 minutes  Recommendations for Outpatient Follow-up:  1. Repeat CBC to follow platelets level and Hgb 2. Check BMET to follow electrolytes and renal function   Discharge Diagnoses:  Active Problems:   Liver disease, chronic, with cirrhosis   Thrombocytopenia   Chronic diastolic CHF (congestive heart failure), NYHA class 1   Breast cancer, right breast   Leg DVT (deep venous thromboembolism), acute   Paroxysmal atrial fibrillation   Discharge Condition: stable and improved. Discharge home on lovenox and with instructions to follow with PCP in 2 weeks and with IR in 2-4 weeks. Patient will follow on 5/27 for resumption of chemotherapy with Dr. Jana Hakim   Diet recommendation: heart healthy diet  Filed Weights   09/30/14 0438 10/01/14 0417 10/02/14 0543  Weight: 70.8 kg (156 lb 1.4 oz) 70.1 kg (154 lb 8.7 oz) 70.8 kg (156 lb 1.4 oz)    History of present illness:  [name] is an 59 y.o. 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  Hospital Course:  Acute bilateral lower extremity DVT -In the setting of subtherapeutic INR and malignancy despite reported compliance with warfarin. -RLE clot re-evaluated on 5/22 and no mobile anymore; per IR no need for IVC filter -thrombolysis on LLE done on 5/21. Patient with improvement in pain and swelling -will discharge on lovenox for chronic anticoagulation  - Continue Oxycodone when necessary pain.  Paroxysmal atrial fibrillation - Presently sinus rhythm and rate  controlled - CAHDS-VASc=4. will be on lovenox for anticoagulation  - Continue metoprolol tartrate for rate control.  Ovarian carcinoma  -Status post exp laparotomy with hysterectomy, BSO, and debulking 06/27/2014--Dr. Denman George and chemotherapy. -continue outpatient oncology follow up  Right Breast Cancer -Followed by Dr. Jana Hakim.  -Continue Femara. -Chemotherapy postponed until 10/06/14. -Patient to follow any rec's from oncology service as provided to her in upcoming follow up visits   Depression/anxiety - Continue Lexapro, Clonopin, alprazolam, and PRN clonazepam.   Chronic systolic and diastolic CHF  - Clinically compensated at this time.  - 08/02/2014 echocardiogram showed EF 45-50 percent. - Continue home dose furosemide and B-blocker. -daily weights and low sodium diet  Hypertension -Controlled on metoprolol. 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 - No signs of bleeding.  Hypokalemia -Supplemented. -K WNL at discharge  Anemia of chronic disease, malignancy -Hemoglobin stable with no current indication for transfusion. -will recommend continue monitoring of Hgb trend  Procedures: See below for x-ray reports  LE dopplers: with bilateral acute DVT  Consultations:  Dr. Jana Hakim, Oncology  IR  (Dr. Anselm Pancoast, [place])  Discharge Exam: Filed Vitals:   10/02/14 1017  BP: 120/56  Pulse: 78  Temp:   Resp:    Gen: NAD, afebrile; feeling better and reporting much less pain and swelling in her LLE. No SOB and no CP Cardiovascular: RRR, II/VI SEM; no JVD Respiratory: Lungs CTAB Gastrointestinal: Abdomen soft, NT/ND, + BS Extremities: Left leg with less pain and significant improvement in swelling  Discharge Instructions  Discharge Instructions    Discharge instructions  Complete by:  As directed   Follow up with Dr. Jana Hakim as previously schedule Increase activity  slowly Please take medications as prescribed Follow heart healthy diet and take your feeding supplements          Current Discharge Medication List    START taking these medications   Details  docusate sodium (COLACE) 100 MG capsule Take 1 capsule (100 mg total) by mouth 2 (two) times daily. Qty: 20 capsule, Refills: 0    enoxaparin (LOVENOX) 100 MG/ML injection Inject 1 mL (100 mg total) into the skin daily. Qty: 30 Syringe, Refills: 2    oxyCODONE (OXY IR/ROXICODONE) 5 MG immediate release tablet Take 1 tablet (5 mg total) by mouth every 6 (six) hours as needed for severe pain. Qty: 30 tablet, Refills: 0      CONTINUE these medications which have NOT CHANGED   Details  ALPRAZolam (XANAX XR) 1 MG 24 hr tablet TAKE 1 TABLET BY MOUTH TWICE DAILY Qty: 60 tablet, Refills: 0    buPROPion (WELLBUTRIN XL) 150 MG 24 hr tablet Take 1 tablet (150 mg total) by mouth daily. Qty: 30 tablet, Refills: 1    clonazePAM (KLONOPIN) 0.5 MG tablet TAKE 1/2 TABLET BY MOUTH TWICE DAILY AS NEEDED Qty: 30 tablet, Refills: 0    escitalopram (LEXAPRO) 20 MG tablet Take 20 mg by mouth daily.    famotidine (PEPCID) 20 MG tablet Take 1 tablet (20 mg total) by mouth daily. Qty: 30 tablet, Refills: 0    furosemide (LASIX) 20 MG tablet Take 1 tablet (20 mg total) by mouth daily. Qty: 90 tablet, Refills: 1    letrozole (FEMARA) 2.5 MG tablet Take 1 tablet (2.5 mg total) by mouth daily. Qty: 90 tablet, Refills: 0   Associated Diagnoses: Malignant neoplasm of right breast    levothyroxine (SYNTHROID, LEVOTHROID) 150 MCG tablet Take 1 tablet (150 mcg total) by mouth daily. Qty: 90 tablet, Refills: 1    lidocaine-prilocaine (EMLA) cream Apply over port area 1-2 hours before chemotherapy Qty: 30 g, Refills: 0    metoprolol (LOPRESSOR) 50 MG tablet Take 1 tablet (50 mg total) by mouth 2 (two) times daily. Qty: 180 tablet, Refills: 1    Nutritional Supplements (ENSURE HIGH PROTEIN PO) Take 237 mLs by  mouth 2 (two) times daily.    OLANZapine (ZYPREXA) 2.5 MG tablet Take 2.5 mg by mouth at bedtime.    ondansetron (ZOFRAN) 8 MG tablet Take 1 tablet (8 mg total) by mouth 2 (two) times daily. Start the day after chemo for 2 days. Then take as needed for nausea or vomiting. Qty: 30 tablet, Refills: 1   Associated Diagnoses: Clear cell carcinoma of ovary, unspecified laterality; Breast cancer, right breast    polyethylene glycol (MIRALAX / GLYCOLAX) packet Take 17 g by mouth daily. Qty: 14 each, Refills: 0    potassium chloride (K-DUR) 10 MEQ tablet Take 10 mEq by mouth 2 (two) times daily. Refills: 3    prochlorperazine (COMPAZINE) 10 MG tablet Take 1 tablet (10 mg total) by mouth every 6 (six) hours as needed (Nausea or vomiting). Qty: 30 tablet, Refills: 1   Associated Diagnoses: Clear cell carcinoma of ovary, unspecified laterality; Breast cancer, right breast      STOP taking these medications     warfarin (COUMADIN) 3 MG tablet      cephALEXin (KEFLEX) 500 MG capsule      saccharomyces boulardii (FLORASTOR) 250 MG capsule        Allergies  Allergen Reactions  .  Olmesartan Medoxomil Cough    REACTION: ? if cough  . Venlafaxine Other (See Comments)    REACTION: severe dry mouth  . Adhesive [Tape] Rash  . Tetracycline Hcl Other (See Comments)    Unknown reaction, too long for patient to remember    Follow-up Information    Follow up with [name], NP. Schedule an appointment as soon as possible for a visit in 2 weeks.   Specialty:  Internal Medicine   Contact information:   [address] [place] [address] [phone]       Follow up with [facility], [place] Thurmond Butts, MD.   Specialty:  Interventional Radiology   Why:  office will call you with appointment details for follow up visit in 2-4 weeks   Contact information:   [address] [place] [address] [phone]       The results of significant diagnostics from this  hospitalization (including imaging, microbiology, ancillary and laboratory) are listed below for reference.    Significant Diagnostic Studies: Ct Head Wo Contrast  09/28/2014   CLINICAL DATA:  Extensive deep venous thrombosis bilateral legs. History ovarian and breast cancer.  EXAM: CT HEAD WITHOUT CONTRAST  TECHNIQUE: Contiguous axial images were obtained from the base of the skull through the vertex without intravenous contrast.  COMPARISON:  CT head 09/08/2012  FINDINGS: Moderate atrophy has progressed since the prior study. Ventricle size remains within normal limits.  Negative for acute infarct. Negative for hemorrhage or mass lesion. No suggestion of metastatic disease on unenhanced imaging  Atherosclerotic calcification  No skull lesion.  IMPRESSION: Progressive atrophy since 2014.  No acute abnormality.   Electronically Signed   By: Franchot Gallo M.D.   On: 09/28/2014 16:58   Ir Veno/ext/uni Left  09/30/2014   CLINICAL DATA:  59 year old female with bilateral lower extremity DVT and history of ovarian cancer. The patient is currently anticoagulated but continues to have pain in the left calf. Plan for left lower extremity venogram and possible thrombolysis and mechanical thrombectomy.  EXAM: LEFT LOWER EXTREMITY VENOGRAPHY; MECHANICAL VENOUS THROMBECTOMY ; ULTRASOUND GUIDANCE FOR VASCULAR ACCESS  Physician: Stephan Minister. Henn, MD  FLUOROSCOPY TIME:  15 minutes and 24 seconds, 354 mGy  MEDICATIONS: 1.5 mg Versed, 75 mcg fentanyl. A radiology nurse monitored the patient for moderate sedation.  ANESTHESIA/SEDATION: Moderate sedation time: 120 minutes  PROCEDURE: The procedure was explained to the patient. Venography, thrombolysis, mechanical thrombectomy and IVC filter placement were discussed with the patient. The risks and benefits of the procedure were discussed and the patient's questions were addressed. Informed consent was obtained from the patient.  The patient has lower extremities were evaluated with  ultrasound. Patient's primary symptom is pain and swelling in the left calf. Thrombus in the left popliteal vein was identified. Intended to treat the left popliteal thrombus via a posterior tibial vein. As a result, the left ankle and left popliteal region were prepped and draped in sterile fashion. Maximal barrier sterile technique was utilized including caps, mask, sterile gowns, sterile gloves, sterile drape, hand hygiene and skin antiseptic. Patient was in a prone position.  Ultrasound demonstrated small thrombosed posterior tibial veins at the ankle. This area was anesthetized with 1% lidocaine. Multiple attempts were made to cannulate these thrombosed posterior tibial veins with ultrasound guidance. A wire could not be successfully advanced up the calf. As a result, attention was directed to the left popliteal vein. The left popliteal vein was identified with ultrasound. Popliteal fossa  was anesthetized with 1% lidocaine. 21 gauge needle was directed into the occluded popliteal vein with ultrasound guidance and a micropuncture dilator set was placed. Eventually, 6 French vascular sheath was placed and a 5 French catheter was advanced up the left leg. Venography demonstrated thrombus in the left thigh. Catheter was advanced into the left iliac veins and iliac/IVC venography was performed.  Attention was directed to performing mechanical thrombectomy of the thrombus in the left common femoral vein and left femoral vein. 6 [place] vascular sheath was exchanged for an 8 [place] vascular sheath. The Angiojet device and an 8 [place] Mach 1 Guide Catheter were advanced over the wire position in the left common femoral vein. Mechanical thrombectomy was performed using the Rapid Lysis technique. 25 mg TPA was combined with 1 liter of normal saline for the mechanical thrombectomy. Approximately 375 mL of fluid was used for this procedure. The Angiojet and Guide Catheter were pulled down to the upper popliteal vein region  while spinning the Guide Catheter. Follow-up venography was performed. Residual areas of thrombus in the left femoral vein and the left common femoral vein were identified. These areas were treated again with the Angiojet device. Follow-up venography demonstrated patency of the left common femoral vein and left femoral vein with a small amount residual clot. The vascular sheath was removed with manual compression.  FINDINGS: Right lower extremity: Ultrasound demonstrated thrombus in the right femoral vein. Minimal clot identified in the right common femoral vein.  Left lower extremity: Thrombus in the left popliteal vein and left posterior tibial veins identified with ultrasound. Unable to successfully cannulate a thrombosed posterior tibial veins with ultrasound. The left popliteal vein was successfully cannulated. Venography demonstrated clot in left common femoral vein and left femoral vein. Left iliac veins and IVC are patent. Following mechanical thrombectomy, the left common femoral vein and left femoral vein were patent with small amount of nonocclusive thrombus. The left iliac veins and IVC were also patent at the end of the procedure.  Estimated blood loss: Minimal  COMPLICATIONS: None  IMPRESSION: Successful mechanical thrombectomy of the deep vein thrombus in the left femoral vein and left common femoral vein. These vessels were patent at the end of the procedure with a small amount of nonocclusive thrombus.  The left popliteal vein thrombus was not treated with mechanical thrombectomy because of difficulty accessing the posterior tibial veins. If the left calf swelling does not improve, we could try to access the posterior tibial veins again.  History of mobile clot in the right common femoral vein. No significant thrombus was identified in the right common femoral vein today. Recommend repeating a venous duplex of this area to confirm this finding.   Electronically Signed   By: Markus Daft M.D.   On:  09/30/2014 19:46   Ir Thrombect Veno Mech Mod Sed  09/30/2014   CLINICAL DATA:  59 year old female with bilateral lower extremity DVT and history of ovarian cancer. The patient is currently anticoagulated but continues to have pain in the left calf. Plan for left lower extremity venogram and possible thrombolysis and mechanical thrombectomy.  EXAM: LEFT LOWER EXTREMITY VENOGRAPHY; MECHANICAL VENOUS THROMBECTOMY ; ULTRASOUND GUIDANCE FOR VASCULAR ACCESS  Physician: Stephan Minister. Henn, MD  FLUOROSCOPY TIME:  15 minutes and 24 seconds, 354 mGy  MEDICATIONS: 1.5 mg Versed, 75 mcg fentanyl. A radiology nurse monitored the patient for moderate sedation.  ANESTHESIA/SEDATION: Moderate sedation time: 120 minutes  PROCEDURE: The procedure was explained to the patient. Venography, thrombolysis, mechanical thrombectomy  and IVC filter placement were discussed with the patient. The risks and benefits of the procedure were discussed and the patient's questions were addressed. Informed consent was obtained from the patient.  The patient has lower extremities were evaluated with ultrasound. Patient's primary symptom is pain and swelling in the left calf. Thrombus in the left popliteal vein was identified. Intended to treat the left popliteal thrombus via a posterior tibial vein. As a result, the left ankle and left popliteal region were prepped and draped in sterile fashion. Maximal barrier sterile technique was utilized including caps, mask, sterile gowns, sterile gloves, sterile drape, hand hygiene and skin antiseptic. Patient was in a prone position.  Ultrasound demonstrated small thrombosed posterior tibial veins at the ankle. This area was anesthetized with 1% lidocaine. Multiple attempts were made to cannulate these thrombosed posterior tibial veins with ultrasound guidance. A wire could not be successfully advanced up the calf. As a result, attention was directed to the left popliteal vein. The left popliteal vein was  identified with ultrasound. Popliteal fossa was anesthetized with 1% lidocaine. 21 gauge needle was directed into the occluded popliteal vein with ultrasound guidance and a micropuncture dilator set was placed. Eventually, 6 French vascular sheath was placed and a 5 French catheter was advanced up the left leg. Venography demonstrated thrombus in the left thigh. Catheter was advanced into the left iliac veins and iliac/IVC venography was performed.  Attention was directed to performing mechanical thrombectomy of the thrombus in the left common femoral vein and left femoral vein. 6 [place] vascular sheath was exchanged for an 8 [place] vascular sheath. The Angiojet device and an 8 [place] Mach 1 Guide Catheter were advanced over the wire position in the left common femoral vein. Mechanical thrombectomy was performed using the Rapid Lysis technique. 25 mg TPA was combined with 1 liter of normal saline for the mechanical thrombectomy. Approximately 375 mL of fluid was used for this procedure. The Angiojet and Guide Catheter were pulled down to the upper popliteal vein region while spinning the Guide Catheter. Follow-up venography was performed. Residual areas of thrombus in the left femoral vein and the left common femoral vein were identified. These areas were treated again with the Angiojet device. Follow-up venography demonstrated patency of the left common femoral vein and left femoral vein with a small amount residual clot. The vascular sheath was removed with manual compression.  FINDINGS: Right lower extremity: Ultrasound demonstrated thrombus in the right femoral vein. Minimal clot identified in the right common femoral vein.  Left lower extremity: Thrombus in the left popliteal vein and left posterior tibial veins identified with ultrasound. Unable to successfully cannulate a thrombosed posterior tibial veins with ultrasound. The left popliteal vein was successfully cannulated. Venography demonstrated clot in  left common femoral vein and left femoral vein. Left iliac veins and IVC are patent. Following mechanical thrombectomy, the left common femoral vein and left femoral vein were patent with small amount of nonocclusive thrombus. The left iliac veins and IVC were also patent at the end of the procedure.  Estimated blood loss: Minimal  COMPLICATIONS: None  IMPRESSION: Successful mechanical thrombectomy of the deep vein thrombus in the left femoral vein and left common femoral vein. These vessels were patent at the end of the procedure with a small amount of nonocclusive thrombus.  The left popliteal vein thrombus was not treated with mechanical thrombectomy because of difficulty accessing the posterior tibial veins. If the left calf swelling does not improve, we could try to access  the posterior tibial veins again.  History of mobile clot in the right common femoral vein. No significant thrombus was identified in the right common femoral vein today. Recommend repeating a venous duplex of this area to confirm this finding.   Electronically Signed   By: Markus Daft M.D.   On: 09/30/2014 19:46   Ir US Guide Vasc Access Left  09/30/2014   CLINICAL DATA:  59 year old female with bilateral lower extremity DVT and history of ovarian cancer. The patient is currently anticoagulated but continues to have pain in the left calf. Plan for left lower extremity venogram and possible thrombolysis and mechanical thrombectomy.  EXAM: LEFT LOWER EXTREMITY VENOGRAPHY; MECHANICAL VENOUS THROMBECTOMY ; ULTRASOUND GUIDANCE FOR VASCULAR ACCESS  Physician: Stephan Minister. Henn, MD  FLUOROSCOPY TIME:  15 minutes and 24 seconds, 354 mGy  MEDICATIONS: 1.5 mg Versed, 75 mcg fentanyl. A radiology nurse monitored the patient for moderate sedation.  ANESTHESIA/SEDATION: Moderate sedation time: 120 minutes  PROCEDURE: The procedure was explained to the patient. Venography, thrombolysis, mechanical thrombectomy and IVC filter placement were discussed with the  patient. The risks and benefits of the procedure were discussed and the patient's questions were addressed. Informed consent was obtained from the patient.  The patient has lower extremities were evaluated with ultrasound. Patient's primary symptom is pain and swelling in the left calf. Thrombus in the left popliteal vein was identified. Intended to treat the left popliteal thrombus via a posterior tibial vein. As a result, the left ankle and left popliteal region were prepped and draped in sterile fashion. Maximal barrier sterile technique was utilized including caps, mask, sterile gowns, sterile gloves, sterile drape, hand hygiene and skin antiseptic. Patient was in a prone position.  Ultrasound demonstrated small thrombosed posterior tibial veins at the ankle. This area was anesthetized with 1% lidocaine. Multiple attempts were made to cannulate these thrombosed posterior tibial veins with ultrasound guidance. A wire could not be successfully advanced up the calf. As a result, attention was directed to the left popliteal vein. The left popliteal vein was identified with ultrasound. Popliteal fossa was anesthetized with 1% lidocaine. 21 gauge needle was directed into the occluded popliteal vein with ultrasound guidance and a micropuncture dilator set was placed. Eventually, 6 French vascular sheath was placed and a 5 French catheter was advanced up the left leg. Venography demonstrated thrombus in the left thigh. Catheter was advanced into the left iliac veins and iliac/IVC venography was performed.  Attention was directed to performing mechanical thrombectomy of the thrombus in the left common femoral vein and left femoral vein. 6 [place] vascular sheath was exchanged for an 8 [place] vascular sheath. The Angiojet device and an 8 [place] Mach 1 Guide Catheter were advanced over the wire position in the left common femoral vein. Mechanical thrombectomy was performed using the Rapid Lysis technique. 25 mg TPA was  combined with 1 liter of normal saline for the mechanical thrombectomy. Approximately 375 mL of fluid was used for this procedure. The Angiojet and Guide Catheter were pulled down to the upper popliteal vein region while spinning the Guide Catheter. Follow-up venography was performed. Residual areas of thrombus in the left femoral vein and the left common femoral vein were identified. These areas were treated again with the Angiojet device. Follow-up venography demonstrated patency of the left common femoral vein and left femoral vein with a small amount residual clot. The vascular sheath was removed with manual compression.  FINDINGS: Right lower extremity: Ultrasound demonstrated thrombus in the right femoral  vein. Minimal clot identified in the right common femoral vein.  Left lower extremity: Thrombus in the left popliteal vein and left posterior tibial veins identified with ultrasound. Unable to successfully cannulate a thrombosed posterior tibial veins with ultrasound. The left popliteal vein was successfully cannulated. Venography demonstrated clot in left common femoral vein and left femoral vein. Left iliac veins and IVC are patent. Following mechanical thrombectomy, the left common femoral vein and left femoral vein were patent with small amount of nonocclusive thrombus. The left iliac veins and IVC were also patent at the end of the procedure.  Estimated blood loss: Minimal  COMPLICATIONS: None  IMPRESSION: Successful mechanical thrombectomy of the deep vein thrombus in the left femoral vein and left common femoral vein. These vessels were patent at the end of the procedure with a small amount of nonocclusive thrombus.  The left popliteal vein thrombus was not treated with mechanical thrombectomy because of difficulty accessing the posterior tibial veins. If the left calf swelling does not improve, we could try to access the posterior tibial veins again.  History of mobile clot in the right common femoral  vein. No significant thrombus was identified in the right common femoral vein today. Recommend repeating a venous duplex of this area to confirm this finding.   Electronically Signed   By: Markus Daft M.D.   On: 09/30/2014 19:46    Microbiology: Recent Results (from the past 240 hour(s))  MRSA PCR Screening     Status: None   Collection Time: 09/25/14  8:35 PM  Result Value Ref Range Status   MRSA by PCR NEGATIVE NEGATIVE Final    Comment:        The GeneXpert MRSA Assay (FDA approved for NASAL specimens only), is one component of a comprehensive MRSA colonization surveillance program. It is not intended to diagnose MRSA infection nor to guide or monitor treatment for MRSA infections.      Labs: Basic Metabolic Panel:  Recent Labs Lab 09/26/14 0358 09/30/14 0408 10/02/14 0415  NA 137 138 135  K 3.0* 3.9 3.8  CL 108 109 106  CO2 23 20* 20*  GLUCOSE 89 121* 101*  BUN 8 6 6   CREATININE 0.35* 0.42* 0.45  CALCIUM 7.8* 8.5* 8.4*   CBC:  Recent Labs Lab 09/28/14 0506 09/29/14 0931 09/30/14 0408 10/01/14 0555 10/02/14 0415  WBC 4.1 4.3 4.6 4.9 5.3  HGB 11.1* 12.3 11.3* 11.5* 11.0*  HCT 34.6* 37.3 34.8* 35.7* 34.4*  MCV 98.0 96.4 97.2 96.0 96.4  PLT 158 184 173 196 183    Signed:  Barton Dubois  [facility] 10/02/2014, 1:32 PM

## 2014-10-02 NOTE — Progress Notes (Addendum)
Pt discharged home with husband Discharge instructions given & reviewed Education discussed  Port-a-cath dc'd per IV team Tele dc'd  Lovenox education and administration provided per RN, injection done by patient. Pt discharged via wheelchair with NT  All patient belongings at side.   Sherrie Mustache 3:41 PM

## 2014-10-02 NOTE — Progress Notes (Signed)
Referring Physician(s): TRH Magrinat  Subjective:  LLE DVT- rapid lysis thrombectomy 5/21 Great result Pt has no pain swelling down on left leg Ambulating; wearing compression stockings Legs looks almost alike at this time 5/22 B lower extr doppler performed Dr Laurence Ferrari and Dr Anselm Pancoast have reviewed results No further recommendations at this time Other than follow up with Dr Anselm Pancoast 2-4 weeks at IR clinic   Allergies: Olmesartan medoxomil; Venlafaxine; Adhesive; and Tetracycline hcl  Medications: Prior to Admission medications   Medication Sig Start Date End Date Taking? Authorizing Provider  ALPRAZolam (XANAX XR) 1 MG 24 hr tablet TAKE 1 TABLET BY MOUTH TWICE DAILY 09/18/14  Yes Chauncey Cruel, MD  buPROPion (WELLBUTRIN XL) 150 MG 24 hr tablet Take 1 tablet (150 mg total) by mouth daily. 09/15/14  Yes Debbrah Alar, NP  clonazePAM (KLONOPIN) 0.5 MG tablet TAKE 1/2 TABLET BY MOUTH TWICE DAILY AS NEEDED Patient taking differently: TAKE 1/2 TABLET BY MOUTH TWICE DAILY 09/18/14  Yes Debbrah Alar, NP  escitalopram (LEXAPRO) 20 MG tablet Take 20 mg by mouth daily.   Yes Historical Provider, MD  famotidine (PEPCID) 20 MG tablet Take 1 tablet (20 mg total) by mouth daily. Patient taking differently: Take 20 mg by mouth daily as needed.  08/08/14  Yes Modena Jansky, MD  furosemide (LASIX) 20 MG tablet Take 1 tablet (20 mg total) by mouth daily. 05/23/14  Yes Debbrah Alar, NP  letrozole (FEMARA) 2.5 MG tablet Take 1 tablet (2.5 mg total) by mouth daily. 05/22/14  Yes Chauncey Cruel, MD  levothyroxine (SYNTHROID, LEVOTHROID) 150 MCG tablet Take 1 tablet (150 mcg total) by mouth daily. 05/23/14  Yes Debbrah Alar, NP  lidocaine-prilocaine (EMLA) cream Apply over port area 1-2 hours before chemotherapy 07/14/14  Yes Chauncey Cruel, MD  metoprolol (LOPRESSOR) 50 MG tablet Take 1 tablet (50 mg total) by mouth 2 (two) times daily. 08/25/14  Yes Debbrah Alar, NP    Nutritional Supplements (ENSURE HIGH PROTEIN PO) Take 237 mLs by mouth 2 (two) times daily.   Yes Historical Provider, MD  OLANZapine (ZYPREXA) 2.5 MG tablet Take 2.5 mg by mouth at bedtime.   Yes Historical Provider, MD  ondansetron (ZOFRAN) 8 MG tablet Take 1 tablet (8 mg total) by mouth 2 (two) times daily. Start the day after chemo for 2 days. Then take as needed for nausea or vomiting. 09/25/14  Yes Chauncey Cruel, MD  polyethylene glycol (MIRALAX / GLYCOLAX) packet Take 17 g by mouth daily as needed for moderate constipation.   Yes Historical Provider, MD  polyethylene glycol (MIRALAX / GLYCOLAX) packet Take 17 g by mouth daily. 09/04/14  Yes Charlesetta Shanks, MD  potassium chloride (K-DUR) 10 MEQ tablet Take 10 mEq by mouth 2 (two) times daily. 04/18/14  Yes Historical Provider, MD  prochlorperazine (COMPAZINE) 10 MG tablet Take 1 tablet (10 mg total) by mouth every 6 (six) hours as needed (Nausea or vomiting). 09/25/14  Yes Chauncey Cruel, MD  warfarin (COUMADIN) 3 MG tablet Take 1 tablet (3 mg total) by mouth daily at 6 PM. Or as directed by coumadin clinic. Patient taking differently: Take 4.5 mg by mouth daily at 6 PM. Take 1.5 tablets (4.5 mg) daily 08/08/14  Yes Modena Jansky, MD  cephALEXin (KEFLEX) 500 MG capsule Take 1 capsule (500 mg total) by mouth 3 (three) times daily. 09/25/14   Chauncey Cruel, MD  docusate sodium (COLACE) 100 MG capsule Take 1 capsule (100 mg total)  by mouth 2 (two) times daily. 10/02/14   Barton Dubois, MD  enoxaparin (LOVENOX) 100 MG/ML injection Inject 1 mL (100 mg total) into the skin daily. 10/02/14   Barton Dubois, MD  oxyCODONE (OXY IR/ROXICODONE) 5 MG immediate release tablet Take 1 tablet (5 mg total) by mouth every 6 (six) hours as needed for severe pain. 10/02/14   Barton Dubois, MD     Vital Signs: BP 120/56 mmHg  Pulse 78  Temp(Src) 98.3 F (36.8 C) (Oral)  Resp 18  Ht 5\' 5"  (1.651 m)  Wt 70.8 kg (156 lb 1.4 oz)  BMI 25.97 kg/m2  SpO2  96%  Physical Exam  Musculoskeletal: Normal range of motion. She exhibits no edema or tenderness.  LLE swelling resolved B legs almost identical  Skin warm; not tense NT to touch 1+ pulses B  *Preliminary Results* Bilateral lower extremity venous duplex completed.  The right lower extremity is positive for acute deep and superficial vein thrombosis involving the right common femoral, femoral, profunda femoral, popliteal, posterior tibial, and greater saphenous veins. When compared to the prior study from 09/28/14, the thrombus in the right common femoral vein does not appear to be as mobile.  The left lower extremity is positive for acute deep vein thrombosis involving the right saphenofemoral junction, common femoral, femoral, profunda femoral, and popliteal veins. There is flow within the left common femoral vein, however the left popliteal vein appears to be occluded.  There is no evidence of Baker's cyst bilaterally.  Preliminary results discussed with Dr. Dyann Kief.  10/01/2014  Michelle Simonetti, RVT, RDCS, RDMS   Compression stockings on ambulating  Neurological: She is alert.    Imaging: Ct Head Wo Contrast  09/28/2014   CLINICAL DATA:  Extensive deep venous thrombosis bilateral legs. History ovarian and breast cancer.  EXAM: CT HEAD WITHOUT CONTRAST  TECHNIQUE: Contiguous axial images were obtained from the base of the skull through the vertex without intravenous contrast.  COMPARISON:  CT head 09/08/2012  FINDINGS: Moderate atrophy has progressed since the prior study. Ventricle size remains within normal limits.  Negative for acute infarct. Negative for hemorrhage or mass lesion. No suggestion of metastatic disease on unenhanced imaging  Atherosclerotic calcification  No skull lesion.  IMPRESSION: Progressive atrophy since 2014.  No acute abnormality.   Electronically Signed   By: Franchot Gallo M.D.   On: 09/28/2014 16:58   Ir Veno/ext/uni Left  09/30/2014   CLINICAL DATA:   59 year old female with bilateral lower extremity DVT and history of ovarian cancer. The patient is currently anticoagulated but continues to have pain in the left calf. Plan for left lower extremity venogram and possible thrombolysis and mechanical thrombectomy.  EXAM: LEFT LOWER EXTREMITY VENOGRAPHY; MECHANICAL VENOUS THROMBECTOMY ; ULTRASOUND GUIDANCE FOR VASCULAR ACCESS  Physician: Stephan Minister. Henn, MD  FLUOROSCOPY TIME:  15 minutes and 24 seconds, 354 mGy  MEDICATIONS: 1.5 mg Versed, 75 mcg fentanyl. A radiology nurse monitored the patient for moderate sedation.  ANESTHESIA/SEDATION: Moderate sedation time: 120 minutes  PROCEDURE: The procedure was explained to the patient. Venography, thrombolysis, mechanical thrombectomy and IVC filter placement were discussed with the patient. The risks and benefits of the procedure were discussed and the patient's questions were addressed. Informed consent was obtained from the patient.  The patient has lower extremities were evaluated with ultrasound. Patient's primary symptom is pain and swelling in the left calf. Thrombus in the left popliteal vein was identified. Intended to treat the left popliteal thrombus via a posterior tibial vein.  As a result, the left ankle and left popliteal region were prepped and draped in sterile fashion. Maximal barrier sterile technique was utilized including caps, mask, sterile gowns, sterile gloves, sterile drape, hand hygiene and skin antiseptic. Patient was in a prone position.  Ultrasound demonstrated small thrombosed posterior tibial veins at the ankle. This area was anesthetized with 1% lidocaine. Multiple attempts were made to cannulate these thrombosed posterior tibial veins with ultrasound guidance. A wire could not be successfully advanced up the calf. As a result, attention was directed to the left popliteal vein. The left popliteal vein was identified with ultrasound. Popliteal fossa was anesthetized with 1% lidocaine. 21 gauge  needle was directed into the occluded popliteal vein with ultrasound guidance and a micropuncture dilator set was placed. Eventually, 6 French vascular sheath was placed and a 5 French catheter was advanced up the left leg. Venography demonstrated thrombus in the left thigh. Catheter was advanced into the left iliac veins and iliac/IVC venography was performed.  Attention was directed to performing mechanical thrombectomy of the thrombus in the left common femoral vein and left femoral vein. 6 [place] vascular sheath was exchanged for an 8 [place] vascular sheath. The Angiojet device and an 8 [place] Mach 1 Guide Catheter were advanced over the wire position in the left common femoral vein. Mechanical thrombectomy was performed using the Rapid Lysis technique. 25 mg TPA was combined with 1 liter of normal saline for the mechanical thrombectomy. Approximately 375 mL of fluid was used for this procedure. The Angiojet and Guide Catheter were pulled down to the upper popliteal vein region while spinning the Guide Catheter. Follow-up venography was performed. Residual areas of thrombus in the left femoral vein and the left common femoral vein were identified. These areas were treated again with the Angiojet device. Follow-up venography demonstrated patency of the left common femoral vein and left femoral vein with a small amount residual clot. The vascular sheath was removed with manual compression.  FINDINGS: Right lower extremity: Ultrasound demonstrated thrombus in the right femoral vein. Minimal clot identified in the right common femoral vein.  Left lower extremity: Thrombus in the left popliteal vein and left posterior tibial veins identified with ultrasound. Unable to successfully cannulate a thrombosed posterior tibial veins with ultrasound. The left popliteal vein was successfully cannulated. Venography demonstrated clot in left common femoral vein and left femoral vein. Left iliac veins and IVC are patent.  Following mechanical thrombectomy, the left common femoral vein and left femoral vein were patent with small amount of nonocclusive thrombus. The left iliac veins and IVC were also patent at the end of the procedure.  Estimated blood loss: Minimal  COMPLICATIONS: None  IMPRESSION: Successful mechanical thrombectomy of the deep vein thrombus in the left femoral vein and left common femoral vein. These vessels were patent at the end of the procedure with a small amount of nonocclusive thrombus.  The left popliteal vein thrombus was not treated with mechanical thrombectomy because of difficulty accessing the posterior tibial veins. If the left calf swelling does not improve, we could try to access the posterior tibial veins again.  History of mobile clot in the right common femoral vein. No significant thrombus was identified in the right common femoral vein today. Recommend repeating a venous duplex of this area to confirm this finding.   Electronically Signed   By: Markus Daft M.D.   On: 09/30/2014 19:46   Ir Thrombect Veno Mech Mod Sed  09/30/2014   CLINICAL DATA:  59 year old  female with bilateral lower extremity DVT and history of ovarian cancer. The patient is currently anticoagulated but continues to have pain in the left calf. Plan for left lower extremity venogram and possible thrombolysis and mechanical thrombectomy.  EXAM: LEFT LOWER EXTREMITY VENOGRAPHY; MECHANICAL VENOUS THROMBECTOMY ; ULTRASOUND GUIDANCE FOR VASCULAR ACCESS  Physician: Stephan Minister. Henn, MD  FLUOROSCOPY TIME:  15 minutes and 24 seconds, 354 mGy  MEDICATIONS: 1.5 mg Versed, 75 mcg fentanyl. A radiology nurse monitored the patient for moderate sedation.  ANESTHESIA/SEDATION: Moderate sedation time: 120 minutes  PROCEDURE: The procedure was explained to the patient. Venography, thrombolysis, mechanical thrombectomy and IVC filter placement were discussed with the patient. The risks and benefits of the procedure were discussed and the patient's  questions were addressed. Informed consent was obtained from the patient.  The patient has lower extremities were evaluated with ultrasound. Patient's primary symptom is pain and swelling in the left calf. Thrombus in the left popliteal vein was identified. Intended to treat the left popliteal thrombus via a posterior tibial vein. As a result, the left ankle and left popliteal region were prepped and draped in sterile fashion. Maximal barrier sterile technique was utilized including caps, mask, sterile gowns, sterile gloves, sterile drape, hand hygiene and skin antiseptic. Patient was in a prone position.  Ultrasound demonstrated small thrombosed posterior tibial veins at the ankle. This area was anesthetized with 1% lidocaine. Multiple attempts were made to cannulate these thrombosed posterior tibial veins with ultrasound guidance. A wire could not be successfully advanced up the calf. As a result, attention was directed to the left popliteal vein. The left popliteal vein was identified with ultrasound. Popliteal fossa was anesthetized with 1% lidocaine. 21 gauge needle was directed into the occluded popliteal vein with ultrasound guidance and a micropuncture dilator set was placed. Eventually, 6 French vascular sheath was placed and a 5 French catheter was advanced up the left leg. Venography demonstrated thrombus in the left thigh. Catheter was advanced into the left iliac veins and iliac/IVC venography was performed.  Attention was directed to performing mechanical thrombectomy of the thrombus in the left common femoral vein and left femoral vein. 6 [place] vascular sheath was exchanged for an 8 [place] vascular sheath. The Angiojet device and an 8 [place] Mach 1 Guide Catheter were advanced over the wire position in the left common femoral vein. Mechanical thrombectomy was performed using the Rapid Lysis technique. 25 mg TPA was combined with 1 liter of normal saline for the mechanical thrombectomy. Approximately  375 mL of fluid was used for this procedure. The Angiojet and Guide Catheter were pulled down to the upper popliteal vein region while spinning the Guide Catheter. Follow-up venography was performed. Residual areas of thrombus in the left femoral vein and the left common femoral vein were identified. These areas were treated again with the Angiojet device. Follow-up venography demonstrated patency of the left common femoral vein and left femoral vein with a small amount residual clot. The vascular sheath was removed with manual compression.  FINDINGS: Right lower extremity: Ultrasound demonstrated thrombus in the right femoral vein. Minimal clot identified in the right common femoral vein.  Left lower extremity: Thrombus in the left popliteal vein and left posterior tibial veins identified with ultrasound. Unable to successfully cannulate a thrombosed posterior tibial veins with ultrasound. The left popliteal vein was successfully cannulated. Venography demonstrated clot in left common femoral vein and left femoral vein. Left iliac veins and IVC are patent. Following mechanical thrombectomy, the left common femoral  vein and left femoral vein were patent with small amount of nonocclusive thrombus. The left iliac veins and IVC were also patent at the end of the procedure.  Estimated blood loss: Minimal  COMPLICATIONS: None  IMPRESSION: Successful mechanical thrombectomy of the deep vein thrombus in the left femoral vein and left common femoral vein. These vessels were patent at the end of the procedure with a small amount of nonocclusive thrombus.  The left popliteal vein thrombus was not treated with mechanical thrombectomy because of difficulty accessing the posterior tibial veins. If the left calf swelling does not improve, we could try to access the posterior tibial veins again.  History of mobile clot in the right common femoral vein. No significant thrombus was identified in the right common femoral vein today.  Recommend repeating a venous duplex of this area to confirm this finding.   Electronically Signed   By: Markus Daft M.D.   On: 09/30/2014 19:46   Ir US Guide Vasc Access Left  09/30/2014   CLINICAL DATA:  59 year old female with bilateral lower extremity DVT and history of ovarian cancer. The patient is currently anticoagulated but continues to have pain in the left calf. Plan for left lower extremity venogram and possible thrombolysis and mechanical thrombectomy.  EXAM: LEFT LOWER EXTREMITY VENOGRAPHY; MECHANICAL VENOUS THROMBECTOMY ; ULTRASOUND GUIDANCE FOR VASCULAR ACCESS  Physician: Stephan Minister. Henn, MD  FLUOROSCOPY TIME:  15 minutes and 24 seconds, 354 mGy  MEDICATIONS: 1.5 mg Versed, 75 mcg fentanyl. A radiology nurse monitored the patient for moderate sedation.  ANESTHESIA/SEDATION: Moderate sedation time: 120 minutes  PROCEDURE: The procedure was explained to the patient. Venography, thrombolysis, mechanical thrombectomy and IVC filter placement were discussed with the patient. The risks and benefits of the procedure were discussed and the patient's questions were addressed. Informed consent was obtained from the patient.  The patient has lower extremities were evaluated with ultrasound. Patient's primary symptom is pain and swelling in the left calf. Thrombus in the left popliteal vein was identified. Intended to treat the left popliteal thrombus via a posterior tibial vein. As a result, the left ankle and left popliteal region were prepped and draped in sterile fashion. Maximal barrier sterile technique was utilized including caps, mask, sterile gowns, sterile gloves, sterile drape, hand hygiene and skin antiseptic. Patient was in a prone position.  Ultrasound demonstrated small thrombosed posterior tibial veins at the ankle. This area was anesthetized with 1% lidocaine. Multiple attempts were made to cannulate these thrombosed posterior tibial veins with ultrasound guidance. A wire could not be successfully  advanced up the calf. As a result, attention was directed to the left popliteal vein. The left popliteal vein was identified with ultrasound. Popliteal fossa was anesthetized with 1% lidocaine. 21 gauge needle was directed into the occluded popliteal vein with ultrasound guidance and a micropuncture dilator set was placed. Eventually, 6 French vascular sheath was placed and a 5 French catheter was advanced up the left leg. Venography demonstrated thrombus in the left thigh. Catheter was advanced into the left iliac veins and iliac/IVC venography was performed.  Attention was directed to performing mechanical thrombectomy of the thrombus in the left common femoral vein and left femoral vein. 6 [place] vascular sheath was exchanged for an 8 [place] vascular sheath. The Angiojet device and an 8 [place] Mach 1 Guide Catheter were advanced over the wire position in the left common femoral vein. Mechanical thrombectomy was performed using the Rapid Lysis technique. 25 mg TPA was combined with 1 liter  of normal saline for the mechanical thrombectomy. Approximately 375 mL of fluid was used for this procedure. The Angiojet and Guide Catheter were pulled down to the upper popliteal vein region while spinning the Guide Catheter. Follow-up venography was performed. Residual areas of thrombus in the left femoral vein and the left common femoral vein were identified. These areas were treated again with the Angiojet device. Follow-up venography demonstrated patency of the left common femoral vein and left femoral vein with a small amount residual clot. The vascular sheath was removed with manual compression.  FINDINGS: Right lower extremity: Ultrasound demonstrated thrombus in the right femoral vein. Minimal clot identified in the right common femoral vein.  Left lower extremity: Thrombus in the left popliteal vein and left posterior tibial veins identified with ultrasound. Unable to successfully cannulate a thrombosed posterior  tibial veins with ultrasound. The left popliteal vein was successfully cannulated. Venography demonstrated clot in left common femoral vein and left femoral vein. Left iliac veins and IVC are patent. Following mechanical thrombectomy, the left common femoral vein and left femoral vein were patent with small amount of nonocclusive thrombus. The left iliac veins and IVC were also patent at the end of the procedure.  Estimated blood loss: Minimal  COMPLICATIONS: None  IMPRESSION: Successful mechanical thrombectomy of the deep vein thrombus in the left femoral vein and left common femoral vein. These vessels were patent at the end of the procedure with a small amount of nonocclusive thrombus.  The left popliteal vein thrombus was not treated with mechanical thrombectomy because of difficulty accessing the posterior tibial veins. If the left calf swelling does not improve, we could try to access the posterior tibial veins again.  History of mobile clot in the right common femoral vein. No significant thrombus was identified in the right common femoral vein today. Recommend repeating a venous duplex of this area to confirm this finding.   Electronically Signed   By: Markus Daft M.D.   On: 09/30/2014 19:46    Labs:  CBC:  Recent Labs  09/29/14 0931 09/30/14 0408 10/01/14 0555 10/02/14 0415  WBC 4.3 4.6 4.9 5.3  HGB 12.3 11.3* 11.5* 11.0*  HCT 37.3 34.8* 35.7* 34.4*  PLT 184 173 196 183    COAGS:  Recent Labs  07/30/14 0430 07/31/14 0500 08/01/14 0434  08/03/14 0450  09/29/14 0931 09/30/14 0408 10/01/14 0555 10/02/14 0415  INR 2.44* 1.90* 1.63*  < > 1.84*  < > 1.65* 1.61* 1.40 1.46  APTT 35 31 35  --  31  --   --   --   --   --   < > = values in this interval not displayed.  BMP:  Recent Labs  08/18/14 1320  09/25/14 1043 09/26/14 0358 09/30/14 0408 10/02/14 0415  NA 136  < > 141 137 138 135  K 4.0  < > 3.7 3.0* 3.9 3.8  CL 103  --   --  108 109 106  CO2 24  < > 21* 23 20* 20*    GLUCOSE 72  < > 83 89 121* 101*  BUN 5*  < > 7.8 8 6 6   CALCIUM 7.9*  < > 8.3* 7.8* 8.5* 8.4*  CREATININE 0.68  < > 0.5* 0.35* 0.42* 0.45  GFRNONAA >90  --   --  >60 >60 >60  GFRAA >90  --   --  >60 >60 >60  < > = values in this interval not displayed.  LIVER FUNCTION TESTS:  Recent  Labs  08/14/14 1049 08/18/14 1320 09/08/14 1238 09/25/14 1043  BILITOT 1.21* 0.8 1.11 1.06  AST 38* 40* 36* 31  ALT 17 20 22 15   ALKPHOS 162* 152* 166* 130  PROT 5.4* 6.2 6.4 6.7  ALBUMIN 2.1* 2.4* 2.7* 2.7*    Assessment and Plan:  Post LLE DVT rapid lysis thrombectomy 5/21 Doing well Continue anticoagulation Follow up with Dr Anselm Pancoast 2-4 weeks Order in chart for same---she will hear from scheduler with time and date Pt has good understading of plan Discussed with Dr Dyann Kief  Signed: Monia Sabal A 10/02/2014, 1:49 PM   I spent a total of 15 Minutes in face to face in clinical consultation/evaluation, greater than 50% of which was counseling/coordinating care for LLE DVT lysis

## 2014-10-02 NOTE — Progress Notes (Signed)
ANTICOAGULATION CONSULT NOTE - Follow Up Consult  Pharmacy Consult for Heparin>>> lovenox Indication: atrial fibrillation and DVT  Allergies  Allergen Reactions  . Olmesartan Medoxomil Cough    REACTION: ? if cough  . Venlafaxine Other (See Comments)    REACTION: severe dry mouth  . Adhesive [Tape] Rash  . Tetracycline Hcl Other (See Comments)    Unknown reaction, too long for patient to remember     Patient Measurements: Height: 5\' 5"  (165.1 cm) Weight: 156 lb 1.4 oz (70.8 kg) IBW/kg (Calculated) : 57 Heparin Dosing Weight: 69 kg  Vital Signs: Temp: 98.3 F (36.8 C) (05/23 0543) Temp Source: Oral (05/23 0543) BP: 120/56 mmHg (05/23 1017) Pulse Rate: 78 (05/23 1017)  Labs:  Recent Labs  09/30/14 0408 09/30/14 0500 10/01/14 0555 10/02/14 0415  HGB 11.3*  --  11.5* 11.0*  HCT 34.8*  --  35.7* 34.4*  PLT 173  --  196 183  LABPROT 19.2*  --  17.3* 17.8*  INR 1.61*  --  1.40 1.46  HEPARINUNFRC  --  0.43 0.29* 0.46  CREATININE 0.42*  --   --  0.45    Estimated Creatinine Clearance: 73.8 mL/min (by C-G formula based on Cr of 0.45).  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 PMH also includes DVTs and PAF for which she is on chronic warfarin therapy. She is s/p thrombolysis 5/21, on IV heparin and to transition to lovenox.     Goal of Therapy:  Monitor platelets by anticoagulation protocol: Yes   Plan:  -Discontinue heparin  -Lovenox 100mg  Biloxi q24h (1.5mg /kg q24hr)  -Patient noted for discharge today  Hildred Laser, Pharm D 10/02/2014 2:15 PM

## 2014-10-02 NOTE — Progress Notes (Addendum)
Per benefits check: Lovenox is not covered  Enoxaparin sodium is covered- tier 1- co-pay $10.00 for up to 30 day supply- no auth required   Patient can use: most major retail pharmacies (express script participating.)

## 2014-10-02 NOTE — Care Management Note (Signed)
Case Management Note  Patient Details  Name: [name] MRN: [id number] Date of Birth: Apr 05, 1956  Subjective/Objective:   Pt admitted with DVT                 Action/Plan: PTA pt lived at home-   Expected Discharge Date:                  Expected Discharge Plan:  Home/Self Care  In-House Referral:  NA  Discharge planning Services  CM Consult  Post Acute Care Choice:  NA Choice offered to:  NA  DME Arranged:    DME Agency:     HH Arranged:    [place] Agency:     Status of Service:  Completed, signed off  Medicare Important Message Given:  No Date Medicare IM Given:    Medicare IM give by:    Date Additional Medicare IM Given:    Additional Medicare Important Message give by:     If discussed at [place] of Stay Meetings, dates discussed:    Additional Comments: Plan to d/c with Lovenox-  Per benefits check: Lovenox is not covered  Enoxaparin sodium is covered- tier 1- co-pay $10.00 for up to 30 day supply- no auth required   Patient can use: most major retail pharmacies (express script participating.)        Spoke with pt at bedside- above info given- called pharmacy to check on supply in stock- they have 2 syringes at [facility] and 5 syringes at [facility] in [place]- both pharmacies would have to order more stock in to fill script. Pt aware and plans to go to [facility]   Marvetta Gibbons [place], RN- [phone] 10/02/2014, 2:30 PM

## 2014-10-02 NOTE — Telephone Encounter (Signed)
Please contact pt to arrange TCM follow up.  

## 2014-10-02 NOTE — Telephone Encounter (Signed)
s.w. pt husband and advised on May and JUNE....ok and aware

## 2014-10-03 ENCOUNTER — Telehealth: Payer: Self-pay | Admitting: *Deleted

## 2014-10-03 NOTE — Telephone Encounter (Signed)
Attempted to call patient and left voicemail to please return call when available.

## 2014-10-03 NOTE — Telephone Encounter (Signed)
Unable to reach patient at time of TCM Call. Left message for patient to return call when available.  

## 2014-10-04 NOTE — Telephone Encounter (Signed)
Attempted to call patient again and left voicemail to return call.

## 2014-10-04 NOTE — Telephone Encounter (Signed)
Attempted to call patient to arrange follow-up.  Left message for patient to return call.

## 2014-10-05 ENCOUNTER — Telehealth: Payer: Self-pay | Admitting: Family

## 2014-10-05 NOTE — Telephone Encounter (Signed)
Caller name: Caryl Pina with [facility] Can be reached: [phone]  Reason for call: Resumption of care for Home Health requested. Pt was recently released from the hospital. Pt was at [facility]. Pt came home on 10/03/14. Please call with verbal order.

## 2014-10-05 NOTE — Telephone Encounter (Signed)
FYI- Called and spoke to husband who states patient is doing well but he would not like to schedule a follow-up at this time.  He states she has an appointment with Dr. Griffith Citron and he will know her chemo schedule better after this appointment and will call to schedule at that time.

## 2014-10-06 ENCOUNTER — Other Ambulatory Visit (HOSPITAL_BASED_OUTPATIENT_CLINIC_OR_DEPARTMENT_OTHER): Payer: BLUE CROSS/BLUE SHIELD

## 2014-10-06 ENCOUNTER — Other Ambulatory Visit: Payer: Self-pay | Admitting: Oncology

## 2014-10-06 ENCOUNTER — Ambulatory Visit (HOSPITAL_BASED_OUTPATIENT_CLINIC_OR_DEPARTMENT_OTHER): Payer: BLUE CROSS/BLUE SHIELD | Admitting: Oncology

## 2014-10-06 ENCOUNTER — Telehealth: Payer: Self-pay | Admitting: Oncology

## 2014-10-06 ENCOUNTER — Ambulatory Visit: Payer: Self-pay | Admitting: Family

## 2014-10-06 ENCOUNTER — Encounter: Payer: BLUE CROSS/BLUE SHIELD | Admitting: Nurse Practitioner

## 2014-10-06 DIAGNOSIS — Z79811 Long term (current) use of aromatase inhibitors: Secondary | ICD-10-CM

## 2014-10-06 DIAGNOSIS — K760 Fatty (change of) liver, not elsewhere classified: Secondary | ICD-10-CM

## 2014-10-06 DIAGNOSIS — I82409 Acute embolism and thrombosis of unspecified deep veins of unspecified lower extremity: Secondary | ICD-10-CM

## 2014-10-06 DIAGNOSIS — C50911 Malignant neoplasm of unspecified site of right female breast: Secondary | ICD-10-CM

## 2014-10-06 DIAGNOSIS — Z86718 Personal history of other venous thrombosis and embolism: Secondary | ICD-10-CM | POA: Diagnosis not present

## 2014-10-06 DIAGNOSIS — I4891 Unspecified atrial fibrillation: Secondary | ICD-10-CM

## 2014-10-06 DIAGNOSIS — C569 Malignant neoplasm of unspecified ovary: Secondary | ICD-10-CM

## 2014-10-06 LAB — CBC WITH DIFFERENTIAL/PLATELET
BASO%: 0.2 % (ref 0.0–2.0)
BASOS ABS: 0 10*3/uL (ref 0.0–0.1)
EOS ABS: 0.2 10*3/uL (ref 0.0–0.5)
EOS%: 3.5 % (ref 0.0–7.0)
HCT: 36.1 % (ref 34.8–46.6)
HEMOGLOBIN: 11.6 g/dL (ref 11.6–15.9)
LYMPH%: 17.2 % (ref 14.0–49.7)
MCH: 31.3 pg (ref 25.1–34.0)
MCHC: 32.1 g/dL (ref 31.5–36.0)
MCV: 97.3 fL (ref 79.5–101.0)
MONO#: 0.4 10*3/uL (ref 0.1–0.9)
MONO%: 9.7 % (ref 0.0–14.0)
NEUT%: 69.4 % (ref 38.4–76.8)
NEUTROS ABS: 2.9 10*3/uL (ref 1.5–6.5)
Platelets: 154 10*3/uL (ref 145–400)
RBC: 3.71 10*6/uL (ref 3.70–5.45)
RDW: 15.6 % — AB (ref 11.2–14.5)
WBC: 4.2 10*3/uL (ref 3.9–10.3)
lymph#: 0.7 10*3/uL — ABNORMAL LOW (ref 0.9–3.3)

## 2014-10-06 LAB — COMPREHENSIVE METABOLIC PANEL (CC13)
ALBUMIN: 2.9 g/dL — AB (ref 3.5–5.0)
ALK PHOS: 177 U/L — AB (ref 40–150)
ALT: 25 U/L (ref 0–55)
AST: 62 U/L — ABNORMAL HIGH (ref 5–34)
Anion Gap: 10 mEq/L (ref 3–11)
BILIRUBIN TOTAL: 0.7 mg/dL (ref 0.20–1.20)
BUN: 7.2 mg/dL (ref 7.0–26.0)
CO2: 22 meq/L (ref 22–29)
CREATININE: 0.6 mg/dL (ref 0.6–1.1)
Calcium: 8.6 mg/dL (ref 8.4–10.4)
Chloride: 107 mEq/L (ref 98–109)
EGFR: >90 mL/min/{1.73_m2} (ref >90)
Glucose: 106 mg/dl (ref 70–140)
Potassium: 3.4 mEq/L — ABNORMAL LOW (ref 3.5–5.1)
Sodium: 138 mEq/L (ref 136–145)
TOTAL PROTEIN: 7 g/dL (ref 6.4–8.3)

## 2014-10-06 LAB — PROTIME-INR
INR: 1.2 — AB (ref 2.00–3.50)
Protime: 14.4 Seconds — ABNORMAL HIGH (ref 10.6–13.4)

## 2014-10-06 NOTE — Telephone Encounter (Signed)
Gave avs & calednar for June thur July.

## 2014-10-06 NOTE — Progress Notes (Signed)
This encounter was created in error - please disregard.

## 2014-10-06 NOTE — Telephone Encounter (Signed)
Please resume home health

## 2014-10-06 NOTE — Telephone Encounter (Signed)
Notified Ashley

## 2014-10-06 NOTE — Progress Notes (Signed)
ID: [name]   DOB: [birth year]  MR#: [id number]  PJK#:[id number]  PCP: [name], NP GYN:  SU: [facility] OTHER YK:DXIPJA [name], Shanon Ace, Roney Jaffe  CHIEF COMPLAINT:  Estrogen receptor positive Breast Cancer; Ovarian cancer; DVT  CURRENT TREATMENT: Letrozole; abraxane; lovenox   BREAST CANCER HISTORY: From the original consult note:  [name] noted a mass in her right breast mid December 2013, and as it did not spontaneously resolve over a couple of weeks she brought it to her primary physician's attention. She was set up for diagnostic mammography and right breast ultrasonography at the breast Center 05/10/2012. (Note that the patient's most recent prior mammography had been in October 2008). The current study showed a spiculated mass in the superior subareolar portion of the right breast measuring approximately 5 cm and associated with pleomorphic calcifications. This was firm and palpable. There was right nipple retraction and skin thickening. Ultrasound confirmed an irregularly marginated hypoechoic mass measuring 3.5 cm by ultrasound, and an abnormal appearing lower right axillary lymph node measuring 2.6 cm.  Biopsies of both the breast mass and the abnormal appearing lymph node were performed 05/21/2012. Both showed an invasive ductal carcinoma, grade 2, with similar prognostic panels (the breast mass was 100% estrogen and 73% progesterone receptor positive, with an MIB-1 of 5%; the lymph node was 100% estrogen 100% progesterone receptor positive, with an MIB-1 of 20%). Both masses were HER-2 negative.  Breast MRI obtained at [facility] imaging 05/29/2012 confirmed a dominant mass in the retroareolar right breast measuring 4.4 cm maximally. There was a satellite nodule inferior and lateral to this mass, measuring 1.7 cm. There were no other masses in either breast. Aside from the previously biopsied lymph node there were other mildly  enhancing level I right axillary lymph nodes which did not appear pathologic. There was no other lymphadenopathy noted.   The patient's subsequent history is as detailed below.  OVARIAN CANCER HISTORY: From the 06/16/2014 summary note:  "[name] returns today for review of her restaging studies accompanied by her husband [name]. To summarize her recent history: She was admitted to [facility] with a diagnosis of possible pneumonia in late December 2015. Chest x-ray showed some increased density in the right upper lobe. CT scan was suggested and was obtained by Dr. Inda Castle. This showed 2 small nodules in the right middle lobe, measuring 8 and 4 mm respectively. Dr. Inda Castle then contacted [name] for further evaluation and we set [name] up for a PET scan and which was performed late January. This showed the 2 nodules in the lung not to be hypermetabolic. This of course does not prove that they are not malignant. More importantly, there was a 13.7 cm cystic/solid mass in the upper pelvis associated with adenopathy,. All of this was hypermetabolic."  The patient was evaluated by gynecologic oncology and on 06/27/2014 she underwent surgery under Dr. Denman George. This consisted of an exploratory laparotomy with hysterectomy and abdominal salpingo-oophorectomy, as well as tumor debulking and omentectomy. 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 The tumor measured 11.5 cm, focally involve the ovarian capsule on the left; the uterus and right ovary and fallopian tube were unremarkable. One para-aortic lymph node was positive out of a total of 6 lymph nodes sampled (2 left common iliac, 2 left para-aortic, and to within the omental resection).  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 To recap  recent events: She received a cycle of carboplatin and paclitaxel, given day 1 day 8, completed 07/24/2014. She tolerated this poorly and ended up in the hospital with [name] sepsis. She was then discharged but readmitted 09/25/2014 with bilateral lower extremity DVTs. She takes Coumadin for paroxysmal atrial fibrillation, but her INR was subtherapeutic. She was started on intravenous heparin, but with significant left lower extremity swelling proceeded to thrombolysis 09/30/2014. She had an excellent response, with resolution of the left lower extremity swelling. She is now Lovenox, which she administers herself. She is doing well with this. They're out-of-pocket expenses $10 a month for that medication. She is here now to discuss resumption of treatment for her ovarian cancer.  REVIEW OF SYSTEMS: [name] has lost a considerable amount of weight, going from 193 pounds March 2016 down to 159 pounds April 2016. Her weight is now more stable, being 155 pounds today. She tells me she is eating better. She does not do much cooking at all, but buys prepared food. They also go out to eat frequently. She denies nausea vomiting, or significant taste alteration. She denies unusual headaches, visual changes, or dizziness. There have been no mouth sores. She denies cough, phlegm production, pleurisy, or hemoptysis. There has been no epistaxis or other bleeding. She is slightly on the constipated side and takes stool softeners at times. For activity she walks around the house and does housework. He has been no fever, rash, or unexplained fatigue. A detailed review of systems today was otherwise stable  PAST MEDICAL HISTORY: Past Medical History  Diagnosis Date  . Depression   . DVT (deep venous thrombosis)     hx of on HRT left leg ~2006  . GERD (gastroesophageal reflux disease)   . Hyperlipidemia   . Hypertension   . Hypothyroidism   . PPD positive, treated     rx inh   . Diabetes mellitus   . Liver  disease, chronic, with cirrhosis     ? autoimmune  . DJD (degenerative joint disease) of lumbar spine   . Chronic combined systolic and diastolic CHF (congestive heart failure)     a. 08/2012 Echo: EF 55-60%, no rwma, mod MR;  b. 07/2014 Echo: EF 45-50%, distal antsept HK, mod TR/MR, mildly bil-atrial enlargement.  . Paroxysmal atrial fibrillation     a. CHA2DS2VASc = 4-->coumadin.  [name] Physical deconditioning   . Allergy   . Anemia     low iron hx  . Cataract     removed ou  . OSA on CPAP     cpap 4.5 setting  . Breast cancer 2014    a. 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 Clear Cell Ovarian Cancer     a. 06/2014 s/p TAH/BSO debulking/lymph node dissection;  b. Now on chemo.    PAST SURGICAL HISTORY: Past Surgical History  Procedure Laterality Date  . Tubal ligation    . Cholecystectomy    . Foot surgery    . Eye surgery    . Cataract extraction    . Percutaneous liver biopsy    . Breast biopsy      left breast  . Mastectomy modified radical  06/08/2012    Procedure: MASTECTOMY MODIFIED RADICAL;  Surgeon: Edward Jolly, MD;  Location: [place];  Service: General;  Laterality: Right;  . Portacath placement  06/08/2012    Procedure: INSERTION PORT-A-CATH;  Surgeon: Edward Jolly, MD;  Location: [place];  Service: General;  Laterality: Left;  . Peg tube placement      peg removed 2015  . History of chemotherapy x 2 treatments, radiation tx  2014  . Pac removed  2015  . Laparotomy N/A 06/27/2014    Procedure: EXPLORATORY LAPAROTOMY;  Surgeon: Everitt Amber, MD;  Location: [facility];  Service: Gynecology;  Laterality: N/A;  . Abdominal hysterectomy N/A 06/27/2014    Procedure: HYSTERECTOMY ABDOMINAL TOTAL;  Surgeon: Everitt Amber, MD;  Location: [facility];  Service: Gynecology;  Laterality: N/A;  . Salpingoophorectomy Bilateral 06/27/2014    Procedure: BILATERAL SALPINGO  OOPHORECTOMY/TUMOR DEBULKING/LYMPHNODE DISSECTION, OMENTECTOMY;  Surgeon: Everitt Amber, MD;  Location: [facility];  Service: Gynecology;  Laterality: Bilateral;    FAMILY HISTORY Family History  Problem Relation Age of Onset  . Diabetes Mother   . Hypertension Mother   . Arthritis Mother   . Heart disease Mother   . Heart failure Mother   . Other Mother     benign breast mass  . Stroke Father   . Heart disease Father   . Diabetes Paternal Grandmother   . Colon cancer Paternal Grandfather    the patient's father died in his 59s with a history of dementia. He had had prior strokes. The patient's mother died in her 2s, with a history of congestive heart failure. Iver had no brothers, one sister. There is no history of breast or ovarian cancer in the family.  GYNECOLOGIC HISTORY: Menarche age 18, first live birth age 64, she is GX P2, menopause approximately 15 years ago, on hormone replacement until 2010.  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 Her husband [name] works for [employer]. Daughter Paul Half is a Physiological scientist and lives in [place]. Daughter Santiago Bur and her family (husband and 2 children aged 60 and 3 years) currently live with the patient. Verl is a member of a [employer].   ADVANCED DIRECTIVES: Not in place  HEALTH MAINTENANCE: (Updated October 2014) History  Substance Use Topics  . Smoking status: Former Smoker -- 1.00 packs/day for 1 years  . Smokeless tobacco: Never Used  . Alcohol Use: No     Colonoscopy: Never  PAP: Does not recall  Bone density: Never  Lipid panel:   Allergies  Allergen Reactions  . Olmesartan Medoxomil Cough    REACTION: ? if cough  . Venlafaxine Other (See Comments)    REACTION: severe dry mouth  . Adhesive [Tape] Rash  . Tetracycline Hcl Other (See Comments)    Unknown reaction, too long for  patient to remember     Current Outpatient Prescriptions  Medication Sig Dispense Refill  . ALPRAZolam (XANAX XR) 1 MG 24 hr tablet TAKE 1 TABLET BY MOUTH TWICE DAILY 60 tablet 0  . buPROPion (WELLBUTRIN XL) 150 MG 24 hr tablet Take 1 tablet (150 mg total) by mouth daily. 30 tablet 1  . clonazePAM (KLONOPIN) 0.5 MG tablet TAKE 1/2 TABLET BY MOUTH TWICE DAILY AS NEEDED (Patient taking differently: TAKE 1/2 TABLET BY MOUTH TWICE DAILY) 30 tablet 0  . docusate sodium (COLACE) 100 MG capsule Take 1 capsule (100 mg total) by mouth 2 (two) times daily. 20 capsule 0  . enoxaparin (LOVENOX) 100 MG/ML injection Inject 1 mL (100 mg  total) into the skin daily. 30 Syringe 2  . escitalopram (LEXAPRO) 20 MG tablet Take 20 mg by mouth daily.    . famotidine (PEPCID) 20 MG tablet Take 1 tablet (20 mg total) by mouth daily. (Patient not taking: Reported on 10/06/2014) 30 tablet 0  . furosemide (LASIX) 20 MG tablet Take 1 tablet (20 mg total) by mouth daily. 90 tablet 1  . letrozole (FEMARA) 2.5 MG tablet Take 1 tablet (2.5 mg total) by mouth daily. 90 tablet 0  . levothyroxine (SYNTHROID, LEVOTHROID) 150 MCG tablet Take 1 tablet (150 mcg total) by mouth daily. 90 tablet 1  . lidocaine-prilocaine (EMLA) cream Apply over port area 1-2 hours before chemotherapy (Patient not taking: Reported on 10/06/2014) 30 g 0  . metoprolol (LOPRESSOR) 50 MG tablet Take 1 tablet (50 mg total) by mouth 2 (two) times daily. 180 tablet 1  . Nutritional Supplements (ENSURE HIGH PROTEIN PO) Take 237 mLs by mouth 2 (two) times daily.    [name] OLANZapine (ZYPREXA) 2.5 MG tablet Take 2.5 mg by mouth at bedtime.    . ondansetron (ZOFRAN) 8 MG tablet Take 1 tablet (8 mg total) by mouth 2 (two) times daily. Start the day after chemo for 2 days. Then take as needed for nausea or vomiting. (Patient not taking: Reported on 10/06/2014) 30 tablet 1  . polyethylene glycol (MIRALAX / GLYCOLAX) packet Take 17 g by mouth daily. (Patient not taking: Reported  on 10/06/2014) 14 each 0  . potassium chloride (K-DUR) 10 MEQ tablet Take 10 mEq by mouth 2 (two) times daily.  3  . prochlorperazine (COMPAZINE) 10 MG tablet Take 1 tablet (10 mg total) by mouth every 6 (six) hours as needed (Nausea or vomiting). (Patient not taking: Reported on 10/06/2014) 30 tablet 1   No current facility-administered medications for this visit.    OBJECTIVE: Middle-aged white [name] in no acute distress There were no vitals filed for this visit.   There is no weight on file to calculate BMI.    ECOG FS: 1 There were no vitals filed for this visit. Vitals - 1 value per visit 7/56/4332  SYSTOLIC 951  DIASTOLIC 58  Pulse 79  Temperature 98.1  Respirations 18  Weight (lb) 154.9  Height 5' 5"  BMI 25.78  VISIT REPORT    Sclerae unicteric, EOMs intact Oropharynx clear, dentition in fair repair No cervical or supraclavicular adenopathy Lungs no rales or rhonchi Heart regular rate and rhythm Abd soft, nontender, positive bowel sounds MSK no focal spinal tenderness, no ankle edema bilaterally, compression stockings in place, rather loose Neuro: nonfocal, well oriented, appropriate affect Breasts: Deferred   LAB RESULTS: Lab Results  Component Value Date   WBC 4.2 10/06/2014   NEUTROABS 2.9 10/06/2014   HGB 11.6 10/06/2014   HCT 36.1 10/06/2014   MCV 97.3 10/06/2014   PLT 154 10/06/2014      Chemistry      Component Value Date/Time   NA 138 10/06/2014 0753   NA 135 10/02/2014 0415   K 3.4* 10/06/2014 0753   K 3.8 10/02/2014 0415   CL 106 10/02/2014 0415   CL 104 10/29/2012 1058   CO2 22 10/06/2014 0753   CO2 20* 10/02/2014 0415   BUN 7.2 10/06/2014 0753   BUN 6 10/02/2014 0415   CREATININE 0.6 10/06/2014 0753   CREATININE 0.45 10/02/2014 0415   CREATININE 0.69 11/10/2013 0828      Component Value Date/Time   CALCIUM 8.6 10/06/2014 0753   CALCIUM  8.4* 10/02/2014 0415   ALKPHOS 177* 10/06/2014 0753   ALKPHOS 152* 08/18/2014 1320   AST 62*  10/06/2014 0753   AST 40* 08/18/2014 1320   ALT 25 10/06/2014 0753   ALT 20 08/18/2014 1320   BILITOT 0.70 10/06/2014 0753   BILITOT 0.8 08/18/2014 1320      STUDIES: Ct Head Wo Contrast  09/28/2014   CLINICAL DATA:  Extensive deep venous thrombosis bilateral legs. History ovarian and breast cancer.  EXAM: CT HEAD WITHOUT CONTRAST  TECHNIQUE: Contiguous axial images were obtained from the base of the skull through the vertex without intravenous contrast.  COMPARISON:  CT head 09/08/2012  FINDINGS: Moderate atrophy has progressed since the prior study. Ventricle size remains within normal limits.  Negative for acute infarct. Negative for hemorrhage or mass lesion. No suggestion of metastatic disease on unenhanced imaging  Atherosclerotic calcification  No skull lesion.  IMPRESSION: Progressive atrophy since 2014.  No acute abnormality.   Electronically Signed   By: Franchot Gallo M.D.   On: 09/28/2014 16:58   Ir Veno/ext/uni Left  09/30/2014   CLINICAL DATA:  59 year old female with bilateral lower extremity DVT and history of ovarian cancer. The patient is currently anticoagulated but continues to have pain in the left calf. Plan for left lower extremity venogram and possible thrombolysis and mechanical thrombectomy.  EXAM: LEFT LOWER EXTREMITY VENOGRAPHY; MECHANICAL VENOUS THROMBECTOMY ; ULTRASOUND GUIDANCE FOR VASCULAR ACCESS  Physician: Stephan Minister. Henn, MD  FLUOROSCOPY TIME:  15 minutes and 24 seconds, 354 mGy  MEDICATIONS: 1.5 mg Versed, 75 mcg fentanyl. A radiology nurse monitored the patient for moderate sedation.  ANESTHESIA/SEDATION: Moderate sedation time: 120 minutes  PROCEDURE: The procedure was explained to the patient. Venography, thrombolysis, mechanical thrombectomy and IVC filter placement were discussed with the patient. The risks and benefits of the procedure were discussed and the patient's questions were addressed. Informed consent was obtained from the patient.  The patient has lower  extremities were evaluated with ultrasound. Patient's primary symptom is pain and swelling in the left calf. Thrombus in the left popliteal vein was identified. Intended to treat the left popliteal thrombus via a posterior tibial vein. As a result, the left ankle and left popliteal region were prepped and draped in sterile fashion. Maximal barrier sterile technique was utilized including caps, mask, sterile gowns, sterile gloves, sterile drape, hand hygiene and skin antiseptic. Patient was in a prone position.  Ultrasound demonstrated small thrombosed posterior tibial veins at the ankle. This area was anesthetized with 1% lidocaine. Multiple attempts were made to cannulate these thrombosed posterior tibial veins with ultrasound guidance. A wire could not be successfully advanced up the calf. As a result, attention was directed to the left popliteal vein. The left popliteal vein was identified with ultrasound. Popliteal fossa was anesthetized with 1% lidocaine. 21 gauge needle was directed into the occluded popliteal vein with ultrasound guidance and a micropuncture dilator set was placed. Eventually, 6 French vascular sheath was placed and a 5 French catheter was advanced up the left leg. Venography demonstrated thrombus in the left thigh. Catheter was advanced into the left iliac veins and iliac/IVC venography was performed.  Attention was directed to performing mechanical thrombectomy of the thrombus in the left common femoral vein and left femoral vein. 6 [place] vascular sheath was exchanged for an 8 [place] vascular sheath. The Angiojet device and an 8 [place] Mach 1 Guide Catheter were advanced over the wire position in the left common femoral vein. Mechanical thrombectomy was performed  using the Rapid Lysis technique. 25 mg TPA was combined with 1 liter of normal saline for the mechanical thrombectomy. Approximately 375 mL of fluid was used for this procedure. The Angiojet and Guide Catheter were pulled down to  the upper popliteal vein region while spinning the Guide Catheter. Follow-up venography was performed. Residual areas of thrombus in the left femoral vein and the left common femoral vein were identified. These areas were treated again with the Angiojet device. Follow-up venography demonstrated patency of the left common femoral vein and left femoral vein with a small amount residual clot. The vascular sheath was removed with manual compression.  FINDINGS: Right lower extremity: Ultrasound demonstrated thrombus in the right femoral vein. Minimal clot identified in the right common femoral vein.  Left lower extremity: Thrombus in the left popliteal vein and left posterior tibial veins identified with ultrasound. Unable to successfully cannulate a thrombosed posterior tibial veins with ultrasound. The left popliteal vein was successfully cannulated. Venography demonstrated clot in left common femoral vein and left femoral vein. Left iliac veins and IVC are patent. Following mechanical thrombectomy, the left common femoral vein and left femoral vein were patent with small amount of nonocclusive thrombus. The left iliac veins and IVC were also patent at the end of the procedure.  Estimated blood loss: Minimal  COMPLICATIONS: None  IMPRESSION: Successful mechanical thrombectomy of the deep vein thrombus in the left femoral vein and left common femoral vein. These vessels were patent at the end of the procedure with a small amount of nonocclusive thrombus.  The left popliteal vein thrombus was not treated with mechanical thrombectomy because of difficulty accessing the posterior tibial veins. If the left calf swelling does not improve, we could try to access the posterior tibial veins again.  History of mobile clot in the right common femoral vein. No significant thrombus was identified in the right common femoral vein today. Recommend repeating a venous duplex of this area to confirm this finding.   Electronically Signed    By: Markus Daft M.D.   On: 09/30/2014 19:46   Ir Thrombect Veno Mech Mod Sed  09/30/2014   CLINICAL DATA:  59 year old female with bilateral lower extremity DVT and history of ovarian cancer. The patient is currently anticoagulated but continues to have pain in the left calf. Plan for left lower extremity venogram and possible thrombolysis and mechanical thrombectomy.  EXAM: LEFT LOWER EXTREMITY VENOGRAPHY; MECHANICAL VENOUS THROMBECTOMY ; ULTRASOUND GUIDANCE FOR VASCULAR ACCESS  Physician: Stephan Minister. Henn, MD  FLUOROSCOPY TIME:  15 minutes and 24 seconds, 354 mGy  MEDICATIONS: 1.5 mg Versed, 75 mcg fentanyl. A radiology nurse monitored the patient for moderate sedation.  ANESTHESIA/SEDATION: Moderate sedation time: 120 minutes  PROCEDURE: The procedure was explained to the patient. Venography, thrombolysis, mechanical thrombectomy and IVC filter placement were discussed with the patient. The risks and benefits of the procedure were discussed and the patient's questions were addressed. Informed consent was obtained from the patient.  The patient has lower extremities were evaluated with ultrasound. Patient's primary symptom is pain and swelling in the left calf. Thrombus in the left popliteal vein was identified. Intended to treat the left popliteal thrombus via a posterior tibial vein. As a result, the left ankle and left popliteal region were prepped and draped in sterile fashion. Maximal barrier sterile technique was utilized including caps, mask, sterile gowns, sterile gloves, sterile drape, hand hygiene and skin antiseptic. Patient was in a prone position.  Ultrasound demonstrated small thrombosed posterior tibial veins  at the ankle. This area was anesthetized with 1% lidocaine. Multiple attempts were made to cannulate these thrombosed posterior tibial veins with ultrasound guidance. A wire could not be successfully advanced up the calf. As a result, attention was directed to the left popliteal vein. The left  popliteal vein was identified with ultrasound. Popliteal fossa was anesthetized with 1% lidocaine. 21 gauge needle was directed into the occluded popliteal vein with ultrasound guidance and a micropuncture dilator set was placed. Eventually, 6 French vascular sheath was placed and a 5 French catheter was advanced up the left leg. Venography demonstrated thrombus in the left thigh. Catheter was advanced into the left iliac veins and iliac/IVC venography was performed.  Attention was directed to performing mechanical thrombectomy of the thrombus in the left common femoral vein and left femoral vein. 6 [place] vascular sheath was exchanged for an 8 [place] vascular sheath. The Angiojet device and an 8 [place] Mach 1 Guide Catheter were advanced over the wire position in the left common femoral vein. Mechanical thrombectomy was performed using the Rapid Lysis technique. 25 mg TPA was combined with 1 liter of normal saline for the mechanical thrombectomy. Approximately 375 mL of fluid was used for this procedure. The Angiojet and Guide Catheter were pulled down to the upper popliteal vein region while spinning the Guide Catheter. Follow-up venography was performed. Residual areas of thrombus in the left femoral vein and the left common femoral vein were identified. These areas were treated again with the Angiojet device. Follow-up venography demonstrated patency of the left common femoral vein and left femoral vein with a small amount residual clot. The vascular sheath was removed with manual compression.  FINDINGS: Right lower extremity: Ultrasound demonstrated thrombus in the right femoral vein. Minimal clot identified in the right common femoral vein.  Left lower extremity: Thrombus in the left popliteal vein and left posterior tibial veins identified with ultrasound. Unable to successfully cannulate a thrombosed posterior tibial veins with ultrasound. The left popliteal vein was successfully cannulated. Venography  demonstrated clot in left common femoral vein and left femoral vein. Left iliac veins and IVC are patent. Following mechanical thrombectomy, the left common femoral vein and left femoral vein were patent with small amount of nonocclusive thrombus. The left iliac veins and IVC were also patent at the end of the procedure.  Estimated blood loss: Minimal  COMPLICATIONS: None  IMPRESSION: Successful mechanical thrombectomy of the deep vein thrombus in the left femoral vein and left common femoral vein. These vessels were patent at the end of the procedure with a small amount of nonocclusive thrombus.  The left popliteal vein thrombus was not treated with mechanical thrombectomy because of difficulty accessing the posterior tibial veins. If the left calf swelling does not improve, we could try to access the posterior tibial veins again.  History of mobile clot in the right common femoral vein. No significant thrombus was identified in the right common femoral vein today. Recommend repeating a venous duplex of this area to confirm this finding.   Electronically Signed   By: Markus Daft M.D.   On: 09/30/2014 19:46   Ir US Guide Vasc Access Left  09/30/2014   CLINICAL DATA:  59 year old female with bilateral lower extremity DVT and history of ovarian cancer. The patient is currently anticoagulated but continues to have pain in the left calf. Plan for left lower extremity venogram and possible thrombolysis and mechanical thrombectomy.  EXAM: LEFT LOWER EXTREMITY VENOGRAPHY; MECHANICAL VENOUS THROMBECTOMY ; ULTRASOUND GUIDANCE FOR VASCULAR  ACCESS  Physician: Stephan Minister. Henn, MD  FLUOROSCOPY TIME:  15 minutes and 24 seconds, 354 mGy  MEDICATIONS: 1.5 mg Versed, 75 mcg fentanyl. A radiology nurse monitored the patient for moderate sedation.  ANESTHESIA/SEDATION: Moderate sedation time: 120 minutes  PROCEDURE: The procedure was explained to the patient. Venography, thrombolysis, mechanical thrombectomy and IVC filter placement  were discussed with the patient. The risks and benefits of the procedure were discussed and the patient's questions were addressed. Informed consent was obtained from the patient.  The patient has lower extremities were evaluated with ultrasound. Patient's primary symptom is pain and swelling in the left calf. Thrombus in the left popliteal vein was identified. Intended to treat the left popliteal thrombus via a posterior tibial vein. As a result, the left ankle and left popliteal region were prepped and draped in sterile fashion. Maximal barrier sterile technique was utilized including caps, mask, sterile gowns, sterile gloves, sterile drape, hand hygiene and skin antiseptic. Patient was in a prone position.  Ultrasound demonstrated small thrombosed posterior tibial veins at the ankle. This area was anesthetized with 1% lidocaine. Multiple attempts were made to cannulate these thrombosed posterior tibial veins with ultrasound guidance. A wire could not be successfully advanced up the calf. As a result, attention was directed to the left popliteal vein. The left popliteal vein was identified with ultrasound. Popliteal fossa was anesthetized with 1% lidocaine. 21 gauge needle was directed into the occluded popliteal vein with ultrasound guidance and a micropuncture dilator set was placed. Eventually, 6 French vascular sheath was placed and a 5 French catheter was advanced up the left leg. Venography demonstrated thrombus in the left thigh. Catheter was advanced into the left iliac veins and iliac/IVC venography was performed.  Attention was directed to performing mechanical thrombectomy of the thrombus in the left common femoral vein and left femoral vein. 6 [place] vascular sheath was exchanged for an 8 [place] vascular sheath. The Angiojet device and an 8 [place] Mach 1 Guide Catheter were advanced over the wire position in the left common femoral vein. Mechanical thrombectomy was performed using the Rapid Lysis  technique. 25 mg TPA was combined with 1 liter of normal saline for the mechanical thrombectomy. Approximately 375 mL of fluid was used for this procedure. The Angiojet and Guide Catheter were pulled down to the upper popliteal vein region while spinning the Guide Catheter. Follow-up venography was performed. Residual areas of thrombus in the left femoral vein and the left common femoral vein were identified. These areas were treated again with the Angiojet device. Follow-up venography demonstrated patency of the left common femoral vein and left femoral vein with a small amount residual clot. The vascular sheath was removed with manual compression.  FINDINGS: Right lower extremity: Ultrasound demonstrated thrombus in the right femoral vein. Minimal clot identified in the right common femoral vein.  Left lower extremity: Thrombus in the left popliteal vein and left posterior tibial veins identified with ultrasound. Unable to successfully cannulate a thrombosed posterior tibial veins with ultrasound. The left popliteal vein was successfully cannulated. Venography demonstrated clot in left common femoral vein and left femoral vein. Left iliac veins and IVC are patent. Following mechanical thrombectomy, the left common femoral vein and left femoral vein were patent with small amount of nonocclusive thrombus. The left iliac veins and IVC were also patent at the end of the procedure.  Estimated blood loss: Minimal  COMPLICATIONS: None  IMPRESSION: Successful mechanical thrombectomy of the deep vein thrombus in the left femoral vein  and left common femoral vein. These vessels were patent at the end of the procedure with a small amount of nonocclusive thrombus.  The left popliteal vein thrombus was not treated with mechanical thrombectomy because of difficulty accessing the posterior tibial veins. If the left calf swelling does not improve, we could try to access the posterior tibial veins again.  History of mobile clot in  the right common femoral vein. No significant thrombus was identified in the right common femoral vein today. Recommend repeating a venous duplex of this area to confirm this finding.   Electronically Signed   By: Markus Daft M.D.   On: 09/30/2014 19:46    ASSESSMENT: 59 y.o. [name]   (1)  status post right mastectomy  06/08/2012 for a pT3, pN1a, stage IIIA invasive ductal carcinoma, grade 2,  estrogen and progesterone receptor positive, HER-2/neu negative, with an MIB-1 of 20%.    (2)  treated in the adjuvant setting with docetaxel/ cyclophosphamide given every 3 weeks. There were multiple and severe complications, and the  patient tolerated only two cycles, last dose 07/29/2012  (3) letrozole started 08/16/2012  (a) osteopenia, with a T score of -1.14 April 2013  (4) postmastectomy radiation completed 12/24/2012  (5)  comorbidities include diabetes, hypertension, chronic liver disease with cirrhosis and fatty liver, and nutritional disturbance with temporarily dependence on PEG feeds (discontinued July 2014).  (6) restaging studies January 2016 show  (a) 8 mm and 4 mm Right middle lobe lung nodules, not metabolically active on PET -- Rhonda require follow-up  (b) hypermetabolic 41.6 cm mixed solid/cystic lesion in upper pelvis, with associated adenopathy  (7) status post exploratory laparotomy 06/27/2014 with total abdominal hysterectomy, bilateral salpingo-oophorectomy, para-aortic lymphadenectomy, omentectomy and radical tumor debulking for a clear cell ovarian cancer, pT1c pN1, stage IIIC .  (8) adjuvant chemotherapy consisting of carboplatin and paclitaxel given weekly days 1 and 8 of each 21 day cycle, for 6-8 cycles as tolerated, started 07/17/2014, with onpro support  (a) day 8 cycle 2 omitted and further treatment delayed because of an episode of [name] sepsis requiring intensive care unit admission  (9) bilateral lower extremity DVTs documented 09/25/2014 despite  being on Coumadin for PAF (subtherapeutic INR); status post thrombolysis 09/30/2014; on Lovenox daily as of 10/02/2014  (10) starting abraxane 09/29/2014, to be repeated every 14 days   PLAN: Xiana has had a rough patch over the last 2 months, but is now stable. We are going to resume chemotherapy next week and this Rhonda consist of Abraxane given every 2 weeks. If she tolerates that well and there is no drop in the CEA 125 we Rhonda consider adding low doses of carboplatin. We Rhonda have to go very conservatively with her given her multiple complications 6 last 2 months.  I have encouraged her to stay as active as she can manage. It is important that she continue to maintain her weight. She goes under 150 we would consider Megace.  Genetics counseling has been scheduled for late June. She is also scheduled for repeat Doppler's through interventional radiology mid-June. Today I wrote her a prescription for compression stockings since she only has one parent present.  Grisel has a good understanding of this plan. She agrees with it.  She Rhonda call with any problems that may develop before her next visit here.   Chauncey Cruel, MD      10/06/2014

## 2014-10-07 LAB — CA 125: CA 125: 113 U/mL — AB (ref <35)

## 2014-10-11 ENCOUNTER — Other Ambulatory Visit: Payer: Self-pay | Admitting: Radiology

## 2014-10-11 NOTE — Progress Notes (Signed)
Per patient request, Rx faxed to [facility] [id number]) for:  Lovenox 100 mg subcu daily for 30 days.  No refills.  Approved by Dr Markus Daft.  Laurena Valko Riki Rusk, RN 10/11/2014 11:19 AM

## 2014-10-13 ENCOUNTER — Ambulatory Visit (HOSPITAL_BASED_OUTPATIENT_CLINIC_OR_DEPARTMENT_OTHER): Payer: BLUE CROSS/BLUE SHIELD

## 2014-10-13 ENCOUNTER — Ambulatory Visit (HOSPITAL_BASED_OUTPATIENT_CLINIC_OR_DEPARTMENT_OTHER): Payer: BLUE CROSS/BLUE SHIELD | Admitting: Nurse Practitioner

## 2014-10-13 ENCOUNTER — Encounter: Payer: Self-pay | Admitting: Nurse Practitioner

## 2014-10-13 ENCOUNTER — Other Ambulatory Visit (HOSPITAL_BASED_OUTPATIENT_CLINIC_OR_DEPARTMENT_OTHER): Payer: BLUE CROSS/BLUE SHIELD

## 2014-10-13 ENCOUNTER — Other Ambulatory Visit: Payer: Self-pay | Admitting: *Deleted

## 2014-10-13 ENCOUNTER — Ambulatory Visit: Payer: Self-pay

## 2014-10-13 VITALS — BP 130/59 | HR 75 | Temp 97.7°F | Resp 18 | Wt 159.5 lb

## 2014-10-13 DIAGNOSIS — C50911 Malignant neoplasm of unspecified site of right female breast: Secondary | ICD-10-CM

## 2014-10-13 DIAGNOSIS — C569 Malignant neoplasm of unspecified ovary: Secondary | ICD-10-CM

## 2014-10-13 DIAGNOSIS — Z86718 Personal history of other venous thrombosis and embolism: Secondary | ICD-10-CM

## 2014-10-13 DIAGNOSIS — Z5111 Encounter for antineoplastic chemotherapy: Secondary | ICD-10-CM | POA: Diagnosis not present

## 2014-10-13 DIAGNOSIS — Z79811 Long term (current) use of aromatase inhibitors: Secondary | ICD-10-CM | POA: Diagnosis not present

## 2014-10-13 DIAGNOSIS — Z170 Estrogen receptor positive status [ER+]: Secondary | ICD-10-CM

## 2014-10-13 DIAGNOSIS — I4891 Unspecified atrial fibrillation: Secondary | ICD-10-CM

## 2014-10-13 LAB — PROTIME-INR
INR: 1.2 — ABNORMAL LOW (ref 2.00–3.50)
PROTIME: 14.4 s — AB (ref 10.6–13.4)

## 2014-10-13 LAB — COMPREHENSIVE METABOLIC PANEL (CC13)
ALT: 22 U/L (ref 0–55)
AST: 55 U/L — ABNORMAL HIGH (ref 5–34)
Albumin: 2.9 g/dL — ABNORMAL LOW (ref 3.5–5.0)
Alkaline Phosphatase: 193 U/L — ABNORMAL HIGH (ref 40–150)
Anion Gap: 7 mEq/L (ref 3–11)
BILIRUBIN TOTAL: 0.71 mg/dL (ref 0.20–1.20)
BUN: 6.1 mg/dL — AB (ref 7.0–26.0)
CALCIUM: 8.7 mg/dL (ref 8.4–10.4)
CO2: 26 mEq/L (ref 22–29)
CREATININE: 0.6 mg/dL (ref 0.6–1.1)
Chloride: 107 mEq/L (ref 98–109)
EGFR: >90 mL/min/{1.73_m2} (ref >90)
GLUCOSE: 84 mg/dL (ref 70–140)
Potassium: 3.9 mEq/L (ref 3.5–5.1)
Sodium: 140 mEq/L (ref 136–145)
Total Protein: 6.8 g/dL (ref 6.4–8.3)

## 2014-10-13 LAB — CBC WITH DIFFERENTIAL/PLATELET
BASO%: 0.4 % (ref 0.0–2.0)
Basophils Absolute: 0 10*3/uL (ref 0.0–0.1)
EOS%: 3 % (ref 0.0–7.0)
Eosinophils Absolute: 0.1 10*3/uL (ref 0.0–0.5)
HEMATOCRIT: 36.2 % (ref 34.8–46.6)
HGB: 11.8 g/dL (ref 11.6–15.9)
LYMPH%: 14.6 % (ref 14.0–49.7)
MCH: 30.9 pg (ref 25.1–34.0)
MCHC: 32.5 g/dL (ref 31.5–36.0)
MCV: 95 fL (ref 79.5–101.0)
MONO#: 0.4 10*3/uL (ref 0.1–0.9)
MONO%: 8.9 % (ref 0.0–14.0)
NEUT#: 3.5 10*3/uL (ref 1.5–6.5)
NEUT%: 73.1 % (ref 38.4–76.8)
Platelets: 142 10*3/uL — ABNORMAL LOW (ref 145–400)
RBC: 3.81 10*6/uL (ref 3.70–5.45)
RDW: 15.7 % — AB (ref 11.2–14.5)
WBC: 4.7 10*3/uL (ref 3.9–10.3)
lymph#: 0.7 10*3/uL — ABNORMAL LOW (ref 0.9–3.3)

## 2014-10-13 MED ORDER — HEPARIN SOD (PORK) LOCK FLUSH 100 UNIT/ML IV SOLN
500.0000 [IU] | Freq: Once | INTRAVENOUS | Status: AC | PRN
  Administered 2014-10-13: 500 [IU]
  Filled 2014-10-13: qty 5

## 2014-10-13 MED ORDER — ONDANSETRON HCL 8 MG PO TABS: 8.0000 mg | ORAL_TABLET | Freq: Two times a day (BID) | ORAL | Status: DC

## 2014-10-13 MED ORDER — SODIUM CHLORIDE 0.9 % IV SOLN
250.0000 mL | Freq: Once | INTRAVENOUS | Status: AC
  Administered 2014-10-13: 250 mL via INTRAVENOUS

## 2014-10-13 MED ORDER — PACLITAXEL PROTEIN-BOUND CHEMO INJECTION 100 MG
100.0000 mg/m2 | Freq: Once | INTRAVENOUS | Status: AC
  Administered 2014-10-13: 175 mg via INTRAVENOUS
  Filled 2014-10-13: qty 35

## 2014-10-13 MED ORDER — SODIUM CHLORIDE 0.9 % IJ SOLN
10.0000 mL | INTRAMUSCULAR | Status: DC | PRN
  Administered 2014-10-13: 10 mL
  Filled 2014-10-13: qty 10

## 2014-10-13 MED ORDER — SODIUM CHLORIDE 0.9 % IV SOLN
Freq: Once | INTRAVENOUS | Status: AC
  Administered 2014-10-13: 10:00:00 via INTRAVENOUS
  Filled 2014-10-13: qty 4

## 2014-10-13 NOTE — Progress Notes (Signed)
ID: [name]   DOB: Nov 30, 1955  MR#: [id number]  VOJ#:[id number]  PCP: [name], NP GYN:  SU: [facility] OTHER XH:[name], Shanon Ace, Roney Jaffe  CHIEF COMPLAINT:  Estrogen receptor positive Breast Cancer; Ovarian cancer; DVT  CURRENT TREATMENT: Letrozole; abraxane; lovenox   BREAST CANCER HISTORY: From the original consult note:  Rumaisa noted a mass in her right breast mid December 2013, and as it did not spontaneously resolve over a couple of weeks she brought it to her primary physician's attention. She was set up for diagnostic mammography and right breast ultrasonography at the breast Center 05/10/2012. (Note that the patient's most recent prior mammography had been in October 2008). The current study showed a spiculated mass in the superior subareolar portion of the right breast measuring approximately 5 cm and associated with pleomorphic calcifications. This was firm and palpable. There was right nipple retraction and skin thickening. Ultrasound confirmed an irregularly marginated hypoechoic mass measuring 3.5 cm by ultrasound, and an abnormal appearing lower right axillary lymph node measuring 2.6 cm.  Biopsies of both the breast mass and the abnormal appearing lymph node were performed 05/21/2012. Both showed an invasive ductal carcinoma, grade 2, with similar prognostic panels (the breast mass was 100% estrogen and 73% progesterone receptor positive, with an MIB-1 of 5%; the lymph node was 100% estrogen 100% progesterone receptor positive, with an MIB-1 of 20%). Both masses were HER-2 negative.  Breast MRI obtained at [facility] imaging 05/29/2012 confirmed a dominant mass in the retroareolar right breast measuring 4.4 cm maximally. There was a satellite nodule inferior and lateral to this mass, measuring 1.7 cm. There were no other masses in either breast. Aside from the previously biopsied lymph node there were other mildly  enhancing level I right axillary lymph nodes which did not appear pathologic. There was no other lymphadenopathy noted.   The patient's subsequent history is as detailed below.  OVARIAN CANCER HISTORY: From the 06/16/2014 summary note:  "Maxene returns today for review of her restaging studies accompanied by her husband [name]. To summarize her recent history: She was admitted to [facility] with a diagnosis of possible pneumonia in late December 2015. Chest x-ray showed some increased density in the right upper lobe. CT scan was suggested and was obtained by Dr. Inda Castle. This showed 2 small nodules in the right middle lobe, measuring 8 and 4 mm respectively. Dr. Inda Castle then contacted [name] for further evaluation and we set Caly up for a PET scan and which was performed late January. This showed the 2 nodules in the lung not to be hypermetabolic. This of course does not prove that they are not malignant. More importantly, there was a 13.7 cm cystic/solid mass in the upper pelvis associated with adenopathy,. All of this was hypermetabolic."  The patient was evaluated by gynecologic oncology and on 06/27/2014 she underwent surgery under Dr. Denman George. This consisted of an exploratory laparotomy with hysterectomy and abdominal salpingo-oophorectomy, as well as tumor debulking and omentectomy. 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 The tumor measured 11.5 cm, focally involve the ovarian capsule on the left; the uterus and right ovary and fallopian tube were unremarkable. One para-aortic lymph node was positive out of a total of 6 lymph nodes sampled (2 left common iliac, 2 left para-aortic, and to within the omental resection).  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 Today she  is to begin abraxane, given every other week.   REVIEW OF SYSTEMS: Lashunta has improved since her last visit. She has been eating well and taking in 2 ensures daily and has gained 2lb. Her energy level is better and she is sleeping well. She denies fevers, chills, nausea, vomiting, or changes in bowel or bladder habits. She has not had to use any colace lately. She has no shortness of breath, chest pain, cough, or palpitations. She denies headaches, dizziness, or vision changes. A detailed review of systems is otherwise stable.   PAST MEDICAL HISTORY: Past Medical History  Diagnosis Date  . Depression   . DVT (deep venous thrombosis)     hx of on HRT left leg ~2006  . GERD (gastroesophageal reflux disease)   . Hyperlipidemia   . Hypertension   . Hypothyroidism   . PPD positive, treated     rx inh   . Diabetes mellitus   . 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 DJD (degenerative joint disease) of lumbar spine   . Chronic combined systolic and diastolic CHF (congestive heart failure)     a. 08/2012 Echo: EF 55-60%, no rwma, mod MR;  b. 07/2014 Echo: EF 45-50%, distal antsept HK, mod TR/MR, mildly bil-atrial enlargement.  . Paroxysmal atrial fibrillation     a. CHA2DS2VASc = 4-->coumadin.  [name] Physical deconditioning   . Allergy   . Anemia     low iron hx  . Cataract     removed ou  . OSA on CPAP     cpap 4.5 setting  . Breast cancer 2014    a. 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 Clear Cell Ovarian Cancer     a. 06/2014 s/p TAH/BSO debulking/lymph node dissection;  b. Now on chemo.    PAST SURGICAL HISTORY: Past Surgical History  Procedure Laterality Date  . Tubal ligation    . Cholecystectomy    . Foot surgery    . Eye surgery    . Cataract extraction    . Percutaneous liver biopsy    . Breast biopsy      left breast  . Mastectomy modified  radical  06/08/2012    Procedure: MASTECTOMY MODIFIED RADICAL;  Surgeon: Edward Jolly, MD;  Location: [place];  Service: General;  Laterality: Right;  . Portacath placement  06/08/2012    Procedure: INSERTION PORT-A-CATH;  Surgeon: Edward Jolly, MD;  Location: [place];  Service: General;  Laterality: Left;  . Peg tube placement      peg removed 2015  . History of chemotherapy x 2 treatments, radiation tx  2014  . Pac removed  2015  . Laparotomy N/A 06/27/2014    Procedure: EXPLORATORY LAPAROTOMY;  Surgeon: Everitt Amber, MD;  Location: [facility];  Service: Gynecology;  Laterality: N/A;  . Abdominal hysterectomy N/A 06/27/2014    Procedure: HYSTERECTOMY ABDOMINAL TOTAL;  Surgeon: Everitt Amber, MD;  Location: [facility];  Service: Gynecology;  Laterality: N/A;  . Salpingoophorectomy Bilateral 06/27/2014    Procedure: BILATERAL SALPINGO OOPHORECTOMY/TUMOR DEBULKING/LYMPHNODE DISSECTION, OMENTECTOMY;  Surgeon: Everitt Amber, MD;  Location: [facility];  Service: Gynecology;  Laterality: Bilateral;    FAMILY HISTORY Family History  Problem Relation Age of Onset  . Diabetes Mother   .  Hypertension Mother   . Arthritis Mother   . Heart disease Mother   . Heart failure Mother   . Other Mother     benign breast mass  . Stroke Father   . Heart disease Father   . Diabetes Paternal Grandmother   . Colon cancer Paternal Grandfather    the patient's father died in his 4s with a history of dementia. He had had prior strokes. The patient's mother died in her 13s, with a history of congestive heart failure. Elmina had no brothers, one sister. There is no history of breast or ovarian cancer in the family.  GYNECOLOGIC HISTORY: Menarche age 83, first live birth age 57, she is GX P2, menopause approximately 15 years ago, on hormone replacement until 2010.  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 Her husband [name] works for  [employer]. Daughter Paul Half is a Physiological scientist and lives in [place]. Daughter Santiago Bur and her family (husband and 2 children aged 80 and 3 years) currently live with the patient. Daffney is a member of a [employer].   ADVANCED DIRECTIVES: Not in place  HEALTH MAINTENANCE: (Updated October 2014) History  Substance Use Topics  . Smoking status: Former Smoker -- 1.00 packs/day for 1 years  . Smokeless tobacco: Never Used  . Alcohol Use: No     Colonoscopy: Never  PAP: Does not recall  Bone density: Never  Lipid panel:   Allergies  Allergen Reactions  . Olmesartan Medoxomil Cough    REACTION: ? if cough  . Venlafaxine Other (See Comments)    REACTION: severe dry mouth  . Adhesive [Tape] Rash  . Tetracycline Hcl Other (See Comments)    Unknown reaction, too long for patient to remember     Current Outpatient Prescriptions  Medication Sig Dispense Refill  . ALPRAZolam (XANAX XR) 1 MG 24 hr tablet TAKE 1 TABLET BY MOUTH TWICE DAILY 60 tablet 0  . buPROPion (WELLBUTRIN XL) 150 MG 24 hr tablet Take 1 tablet (150 mg total) by mouth daily. 30 tablet 1  . clonazePAM (KLONOPIN) 0.5 MG tablet TAKE 1/2 TABLET BY MOUTH TWICE DAILY AS NEEDED (Patient taking differently: TAKE 1/2 TABLET BY MOUTH TWICE DAILY) 30 tablet 0  . enoxaparin (LOVENOX) 100 MG/ML injection Inject 1 mL (100 mg total) into the skin daily. 30 Syringe 2  . escitalopram (LEXAPRO) 20 MG tablet Take 20 mg by mouth daily.    . furosemide (LASIX) 20 MG tablet Take 1 tablet (20 mg total) by mouth daily. 90 tablet 1  . letrozole (FEMARA) 2.5 MG tablet Take 1 tablet (2.5 mg total) by mouth daily. 90 tablet 0  . levothyroxine (SYNTHROID, LEVOTHROID) 150 MCG tablet Take 1 tablet (150 mcg total) by mouth daily. 90 tablet 1  . lidocaine-prilocaine (EMLA) cream Apply over port area 1-2 hours before chemotherapy 30 g 0  . metFORMIN (GLUCOPHAGE) 500 MG tablet Take 500 mg by mouth 2 (two)  times daily with a meal.    . metoprolol (LOPRESSOR) 50 MG tablet Take 1 tablet (50 mg total) by mouth 2 (two) times daily. 180 tablet 1  . Nutritional Supplements (ENSURE HIGH PROTEIN PO) Take 237 mLs by mouth 2 (two) times daily.    [name] OLANZapine (ZYPREXA) 2.5 MG tablet Take 2.5 mg by mouth at bedtime.    . potassium chloride (K-DUR) 10 MEQ tablet Take 10 mEq by mouth  2 (two) times daily.  3  . docusate sodium (COLACE) 100 MG capsule Take 1 capsule (100 mg total) by mouth 2 (two) times daily. (Patient not taking: Reported on 10/13/2014) 20 capsule 0  . famotidine (PEPCID) 20 MG tablet Take 1 tablet (20 mg total) by mouth daily. (Patient not taking: Reported on 10/06/2014) 30 tablet 0  . ondansetron (ZOFRAN) 8 MG tablet Take 1 tablet (8 mg total) by mouth 2 (two) times daily. Start the day after chemo for 2 days. Then take as needed for nausea or vomiting. (Patient not taking: Reported on 10/06/2014) 30 tablet 1  . polyethylene glycol (MIRALAX / GLYCOLAX) packet Take 17 g by mouth daily. (Patient not taking: Reported on 10/06/2014) 14 each 0  . prochlorperazine (COMPAZINE) 10 MG tablet Take 1 tablet (10 mg total) by mouth every 6 (six) hours as needed (Nausea or vomiting). (Patient not taking: Reported on 10/06/2014) 30 tablet 1   No current facility-administered medications for this visit.   Facility-Administered Medications Ordered in Other Visits  Medication Dose Route Frequency Provider Last Rate Last Dose  . 0.9 %  sodium chloride infusion   Intravenous Once Chauncey Cruel, MD      . heparin lock flush 100 unit/mL  500 Units Intracatheter Once PRN Chauncey Cruel, MD      . ondansetron (ZOFRAN) 8 mg, dexamethasone (DECADRON) 10 mg in sodium chloride 0.9 % 50 mL IVPB   Intravenous Once Chauncey Cruel, MD      . PACLitaxel-protein bound (ABRAXANE) chemo infusion 175 mg  100 mg/m2 (Treatment Plan Actual) Intravenous Once Chauncey Cruel, MD      . sodium chloride 0.9 % injection 10 mL  10 mL  Intracatheter PRN Chauncey Cruel, MD        OBJECTIVE: Middle-aged white woman in no acute distress Filed Vitals:   10/13/14 0846  BP: 130/59  Pulse: 75  Temp: 97.7 F (36.5 C)  Resp: 18     Body mass index is 26.54 kg/(m^2).    ECOG FS: 1 Filed Weights   10/13/14 0846  Weight: 159 lb 8 oz (72.349 kg)   Skin: warm, dry  HEENT: sclerae anicteric, conjunctivae pink, oropharynx clear. No thrush or mucositis.  Lymph Nodes: No cervical or supraclavicular lymphadenopathy  Lungs: clear to auscultation bilaterally, no rales, wheezes, or rhonci  Heart: regular rate and rhythm  Abdomen: round, soft, non tender, positive bowel sounds  Musculoskeletal: No focal spinal tenderness, no peripheral edema  Neuro: non focal, well oriented, positive affect  Breasts: deferred  LAB RESULTS: Lab Results  Component Value Date   WBC 4.7 10/13/2014   NEUTROABS 3.5 10/13/2014   HGB 11.8 10/13/2014   HCT 36.2 10/13/2014   MCV 95.0 10/13/2014   PLT 142* 10/13/2014      Chemistry      Component Value Date/Time   NA 140 10/13/2014 0830   NA 135 10/02/2014 0415   K 3.9 10/13/2014 0830   K 3.8 10/02/2014 0415   CL 106 10/02/2014 0415   CL 104 10/29/2012 1058   CO2 26 10/13/2014 0830   CO2 20* 10/02/2014 0415   BUN 6.1* 10/13/2014 0830   BUN 6 10/02/2014 0415   CREATININE 0.6 10/13/2014 0830   CREATININE 0.45 10/02/2014 0415   CREATININE 0.69 11/10/2013 0828      Component Value Date/Time   CALCIUM 8.7 10/13/2014 0830   CALCIUM 8.4* 10/02/2014 0415   ALKPHOS 193* 10/13/2014 0830   ALKPHOS 152*  08/18/2014 1320   AST 55* 10/13/2014 0830   AST 40* 08/18/2014 1320   ALT 22 10/13/2014 0830   ALT 20 08/18/2014 1320   BILITOT 0.71 10/13/2014 0830   BILITOT 0.8 08/18/2014 1320      STUDIES: Ct Head Wo Contrast  09/28/2014   CLINICAL DATA:  Extensive deep venous thrombosis bilateral legs. History ovarian and breast cancer.  EXAM: CT HEAD WITHOUT CONTRAST  TECHNIQUE: Contiguous axial  images were obtained from the base of the skull through the vertex without intravenous contrast.  COMPARISON:  CT head 09/08/2012  FINDINGS: Moderate atrophy has progressed since the prior study. Ventricle size remains within normal limits.  Negative for acute infarct. Negative for hemorrhage or mass lesion. No suggestion of metastatic disease on unenhanced imaging  Atherosclerotic calcification  No skull lesion.  IMPRESSION: Progressive atrophy since 2014.  No acute abnormality.   Electronically Signed   By: Franchot Gallo M.D.   On: 09/28/2014 16:58   Ir Veno/ext/uni Left  09/30/2014   CLINICAL DATA:  59 year old female with bilateral lower extremity DVT and history of ovarian cancer. The patient is currently anticoagulated but continues to have pain in the left calf. Plan for left lower extremity venogram and possible thrombolysis and mechanical thrombectomy.  EXAM: LEFT LOWER EXTREMITY VENOGRAPHY; MECHANICAL VENOUS THROMBECTOMY ; ULTRASOUND GUIDANCE FOR VASCULAR ACCESS  Physician: Stephan Minister. Henn, MD  FLUOROSCOPY TIME:  15 minutes and 24 seconds, 354 mGy  MEDICATIONS: 1.5 mg Versed, 75 mcg fentanyl. A radiology nurse monitored the patient for moderate sedation.  ANESTHESIA/SEDATION: Moderate sedation time: 120 minutes  PROCEDURE: The procedure was explained to the patient. Venography, thrombolysis, mechanical thrombectomy and IVC filter placement were discussed with the patient. The risks and benefits of the procedure were discussed and the patient's questions were addressed. Informed consent was obtained from the patient.  The patient has lower extremities were evaluated with ultrasound. Patient's primary symptom is pain and swelling in the left calf. Thrombus in the left popliteal vein was identified. Intended to treat the left popliteal thrombus via a posterior tibial vein. As a result, the left ankle and left popliteal region were prepped and draped in sterile fashion. Maximal barrier sterile technique was  utilized including caps, mask, sterile gowns, sterile gloves, sterile drape, hand hygiene and skin antiseptic. Patient was in a prone position.  Ultrasound demonstrated small thrombosed posterior tibial veins at the ankle. This area was anesthetized with 1% lidocaine. Multiple attempts were made to cannulate these thrombosed posterior tibial veins with ultrasound guidance. A wire could not be successfully advanced up the calf. As a result, attention was directed to the left popliteal vein. The left popliteal vein was identified with ultrasound. Popliteal fossa was anesthetized with 1% lidocaine. 21 gauge needle was directed into the occluded popliteal vein with ultrasound guidance and a micropuncture dilator set was placed. Eventually, 6 French vascular sheath was placed and a 5 French catheter was advanced up the left leg. Venography demonstrated thrombus in the left thigh. Catheter was advanced into the left iliac veins and iliac/IVC venography was performed.  Attention was directed to performing mechanical thrombectomy of the thrombus in the left common femoral vein and left femoral vein. 6 [place] vascular sheath was exchanged for an 8 [place] vascular sheath. The Angiojet device and an 8 [place] Mach 1 Guide Catheter were advanced over the wire position in the left common femoral vein. Mechanical thrombectomy was performed using the Rapid Lysis technique. 25 mg TPA was combined with 1 liter  of normal saline for the mechanical thrombectomy. Approximately 375 mL of fluid was used for this procedure. The Angiojet and Guide Catheter were pulled down to the upper popliteal vein region while spinning the Guide Catheter. Follow-up venography was performed. Residual areas of thrombus in the left femoral vein and the left common femoral vein were identified. These areas were treated again with the Angiojet device. Follow-up venography demonstrated patency of the left common femoral vein and left femoral vein with a small  amount residual clot. The vascular sheath was removed with manual compression.  FINDINGS: Right lower extremity: Ultrasound demonstrated thrombus in the right femoral vein. Minimal clot identified in the right common femoral vein.  Left lower extremity: Thrombus in the left popliteal vein and left posterior tibial veins identified with ultrasound. Unable to successfully cannulate a thrombosed posterior tibial veins with ultrasound. The left popliteal vein was successfully cannulated. Venography demonstrated clot in left common femoral vein and left femoral vein. Left iliac veins and IVC are patent. Following mechanical thrombectomy, the left common femoral vein and left femoral vein were patent with small amount of nonocclusive thrombus. The left iliac veins and IVC were also patent at the end of the procedure.  Estimated blood loss: Minimal  COMPLICATIONS: None  IMPRESSION: Successful mechanical thrombectomy of the deep vein thrombus in the left femoral vein and left common femoral vein. These vessels were patent at the end of the procedure with a small amount of nonocclusive thrombus.  The left popliteal vein thrombus was not treated with mechanical thrombectomy because of difficulty accessing the posterior tibial veins. If the left calf swelling does not improve, we could try to access the posterior tibial veins again.  History of mobile clot in the right common femoral vein. No significant thrombus was identified in the right common femoral vein today. Recommend repeating a venous duplex of this area to confirm this finding.   Electronically Signed   By: Markus Daft M.D.   On: 09/30/2014 19:46   Ir Thrombect Veno Mech Mod Sed  09/30/2014   CLINICAL DATA:  59 year old female with bilateral lower extremity DVT and history of ovarian cancer. The patient is currently anticoagulated but continues to have pain in the left calf. Plan for left lower extremity venogram and possible thrombolysis and mechanical  thrombectomy.  EXAM: LEFT LOWER EXTREMITY VENOGRAPHY; MECHANICAL VENOUS THROMBECTOMY ; ULTRASOUND GUIDANCE FOR VASCULAR ACCESS  Physician: Stephan Minister. Henn, MD  FLUOROSCOPY TIME:  15 minutes and 24 seconds, 354 mGy  MEDICATIONS: 1.5 mg Versed, 75 mcg fentanyl. A radiology nurse monitored the patient for moderate sedation.  ANESTHESIA/SEDATION: Moderate sedation time: 120 minutes  PROCEDURE: The procedure was explained to the patient. Venography, thrombolysis, mechanical thrombectomy and IVC filter placement were discussed with the patient. The risks and benefits of the procedure were discussed and the patient's questions were addressed. Informed consent was obtained from the patient.  The patient has lower extremities were evaluated with ultrasound. Patient's primary symptom is pain and swelling in the left calf. Thrombus in the left popliteal vein was identified. Intended to treat the left popliteal thrombus via a posterior tibial vein. As a result, the left ankle and left popliteal region were prepped and draped in sterile fashion. Maximal barrier sterile technique was utilized including caps, mask, sterile gowns, sterile gloves, sterile drape, hand hygiene and skin antiseptic. Patient was in a prone position.  Ultrasound demonstrated small thrombosed posterior tibial veins at the ankle. This area was anesthetized with 1% lidocaine. Multiple attempts were  made to cannulate these thrombosed posterior tibial veins with ultrasound guidance. A wire could not be successfully advanced up the calf. As a result, attention was directed to the left popliteal vein. The left popliteal vein was identified with ultrasound. Popliteal fossa was anesthetized with 1% lidocaine. 21 gauge needle was directed into the occluded popliteal vein with ultrasound guidance and a micropuncture dilator set was placed. Eventually, 6 French vascular sheath was placed and a 5 French catheter was advanced up the left leg. Venography demonstrated  thrombus in the left thigh. Catheter was advanced into the left iliac veins and iliac/IVC venography was performed.  Attention was directed to performing mechanical thrombectomy of the thrombus in the left common femoral vein and left femoral vein. 6 [place] vascular sheath was exchanged for an 8 [place] vascular sheath. The Angiojet device and an 8 [place] Mach 1 Guide Catheter were advanced over the wire position in the left common femoral vein. Mechanical thrombectomy was performed using the Rapid Lysis technique. 25 mg TPA was combined with 1 liter of normal saline for the mechanical thrombectomy. Approximately 375 mL of fluid was used for this procedure. The Angiojet and Guide Catheter were pulled down to the upper popliteal vein region while spinning the Guide Catheter. Follow-up venography was performed. Residual areas of thrombus in the left femoral vein and the left common femoral vein were identified. These areas were treated again with the Angiojet device. Follow-up venography demonstrated patency of the left common femoral vein and left femoral vein with a small amount residual clot. The vascular sheath was removed with manual compression.  FINDINGS: Right lower extremity: Ultrasound demonstrated thrombus in the right femoral vein. Minimal clot identified in the right common femoral vein.  Left lower extremity: Thrombus in the left popliteal vein and left posterior tibial veins identified with ultrasound. Unable to successfully cannulate a thrombosed posterior tibial veins with ultrasound. The left popliteal vein was successfully cannulated. Venography demonstrated clot in left common femoral vein and left femoral vein. Left iliac veins and IVC are patent. Following mechanical thrombectomy, the left common femoral vein and left femoral vein were patent with small amount of nonocclusive thrombus. The left iliac veins and IVC were also patent at the end of the procedure.  Estimated blood loss: Minimal   COMPLICATIONS: None  IMPRESSION: Successful mechanical thrombectomy of the deep vein thrombus in the left femoral vein and left common femoral vein. These vessels were patent at the end of the procedure with a small amount of nonocclusive thrombus.  The left popliteal vein thrombus was not treated with mechanical thrombectomy because of difficulty accessing the posterior tibial veins. If the left calf swelling does not improve, we could try to access the posterior tibial veins again.  History of mobile clot in the right common femoral vein. No significant thrombus was identified in the right common femoral vein today. Recommend repeating a venous duplex of this area to confirm this finding.   Electronically Signed   By: Markus Daft M.D.   On: 09/30/2014 19:46   Ir US Guide Vasc Access Left  09/30/2014   CLINICAL DATA:  59 year old female with bilateral lower extremity DVT and history of ovarian cancer. The patient is currently anticoagulated but continues to have pain in the left calf. Plan for left lower extremity venogram and possible thrombolysis and mechanical thrombectomy.  EXAM: LEFT LOWER EXTREMITY VENOGRAPHY; MECHANICAL VENOUS THROMBECTOMY ; ULTRASOUND GUIDANCE FOR VASCULAR ACCESS  Physician: Stephan Minister. Anselm Pancoast, MD  FLUOROSCOPY TIME:  15 minutes  and 24 seconds, 354 mGy  MEDICATIONS: 1.5 mg Versed, 75 mcg fentanyl. A radiology nurse monitored the patient for moderate sedation.  ANESTHESIA/SEDATION: Moderate sedation time: 120 minutes  PROCEDURE: The procedure was explained to the patient. Venography, thrombolysis, mechanical thrombectomy and IVC filter placement were discussed with the patient. The risks and benefits of the procedure were discussed and the patient's questions were addressed. Informed consent was obtained from the patient.  The patient has lower extremities were evaluated with ultrasound. Patient's primary symptom is pain and swelling in the left calf. Thrombus in the left popliteal vein was  identified. Intended to treat the left popliteal thrombus via a posterior tibial vein. As a result, the left ankle and left popliteal region were prepped and draped in sterile fashion. Maximal barrier sterile technique was utilized including caps, mask, sterile gowns, sterile gloves, sterile drape, hand hygiene and skin antiseptic. Patient was in a prone position.  Ultrasound demonstrated small thrombosed posterior tibial veins at the ankle. This area was anesthetized with 1% lidocaine. Multiple attempts were made to cannulate these thrombosed posterior tibial veins with ultrasound guidance. A wire could not be successfully advanced up the calf. As a result, attention was directed to the left popliteal vein. The left popliteal vein was identified with ultrasound. Popliteal fossa was anesthetized with 1% lidocaine. 21 gauge needle was directed into the occluded popliteal vein with ultrasound guidance and a micropuncture dilator set was placed. Eventually, 6 French vascular sheath was placed and a 5 French catheter was advanced up the left leg. Venography demonstrated thrombus in the left thigh. Catheter was advanced into the left iliac veins and iliac/IVC venography was performed.  Attention was directed to performing mechanical thrombectomy of the thrombus in the left common femoral vein and left femoral vein. 6 [place] vascular sheath was exchanged for an 8 [place] vascular sheath. The Angiojet device and an 8 [place] Mach 1 Guide Catheter were advanced over the wire position in the left common femoral vein. Mechanical thrombectomy was performed using the Rapid Lysis technique. 25 mg TPA was combined with 1 liter of normal saline for the mechanical thrombectomy. Approximately 375 mL of fluid was used for this procedure. The Angiojet and Guide Catheter were pulled down to the upper popliteal vein region while spinning the Guide Catheter. Follow-up venography was performed. Residual areas of thrombus in the left femoral  vein and the left common femoral vein were identified. These areas were treated again with the Angiojet device. Follow-up venography demonstrated patency of the left common femoral vein and left femoral vein with a small amount residual clot. The vascular sheath was removed with manual compression.  FINDINGS: Right lower extremity: Ultrasound demonstrated thrombus in the right femoral vein. Minimal clot identified in the right common femoral vein.  Left lower extremity: Thrombus in the left popliteal vein and left posterior tibial veins identified with ultrasound. Unable to successfully cannulate a thrombosed posterior tibial veins with ultrasound. The left popliteal vein was successfully cannulated. Venography demonstrated clot in left common femoral vein and left femoral vein. Left iliac veins and IVC are patent. Following mechanical thrombectomy, the left common femoral vein and left femoral vein were patent with small amount of nonocclusive thrombus. The left iliac veins and IVC were also patent at the end of the procedure.  Estimated blood loss: Minimal  COMPLICATIONS: None  IMPRESSION: Successful mechanical thrombectomy of the deep vein thrombus in the left femoral vein and left common femoral vein. These vessels were patent at the end of  the procedure with a small amount of nonocclusive thrombus.  The left popliteal vein thrombus was not treated with mechanical thrombectomy because of difficulty accessing the posterior tibial veins. If the left calf swelling does not improve, we could try to access the posterior tibial veins again.  History of mobile clot in the right common femoral vein. No significant thrombus was identified in the right common femoral vein today. Recommend repeating a venous duplex of this area to confirm this finding.   Electronically Signed   By: Markus Daft M.D.   On: 09/30/2014 19:46    ASSESSMENT: 59 y.o. [name]   (1)  status post right mastectomy  06/08/2012 for a pT3,  pN1a, stage IIIA invasive ductal carcinoma, grade 2,  estrogen and progesterone receptor positive, HER-2/neu negative, with an MIB-1 of 20%.    (2)  treated in the adjuvant setting with docetaxel/ cyclophosphamide given every 3 weeks. There were multiple and severe complications, and the  patient tolerated only two cycles, last dose 07/29/2012  (3) letrozole started 08/16/2012  (a) osteopenia, with a T score of -1.14 April 2013  (4) postmastectomy radiation completed 12/24/2012  (5)  comorbidities include diabetes, hypertension, chronic liver disease with cirrhosis and fatty liver, and nutritional disturbance with temporarily dependence on PEG feeds (discontinued July 2014).  (6) restaging studies January 2016 show  (a) 8 mm and 4 mm Right middle lobe lung nodules, not metabolically active on PET -- Rhonda require follow-up  (b) hypermetabolic 16.3 cm mixed solid/cystic lesion in upper pelvis, with associated adenopathy  (7) status post exploratory laparotomy 06/27/2014 with total abdominal hysterectomy, bilateral salpingo-oophorectomy, para-aortic lymphadenectomy, omentectomy and radical tumor debulking for a clear cell ovarian cancer, pT1c pN1, stage IIIC .  (8) adjuvant chemotherapy consisting of carboplatin and paclitaxel given weekly days 1 and 8 of each 21 day cycle, for 6-8 cycles as tolerated, started 07/17/2014, with onpro support  (a) day 8 cycle 2 omitted and further treatment delayed because of an episode of Klebsiella sepsis requiring intensive care unit admission  (9) bilateral lower extremity DVTs documented 09/25/2014 despite being on Coumadin for PAF (subtherapeutic INR); status post thrombolysis 09/30/2014; on Lovenox daily as of 10/02/2014  (10) starting abraxane 09/29/2014, to be repeated every 14 days   PLAN: Ioma is doing well today. The lab were reviewed in detail and were entirely stable. She Rhonda proceed with cycle 1 of abraxane today as planned.   We briefly  reviewed her antiemetic schedule for this regimen. She Rhonda eliminated dexamethasone from her list and rely on zofran and compazine alone.   I have encouraged her to continue her eating habits, as they have surely paid off this past week.   Adaira Rhonda return in 2 weeks for cycle 2 of treatment. She understands and agrees with this plans. She knows the goal of treatment in her case is control. She has been encouraged to call with any issues that might arise before her next visit here.   Laurie Panda, NP    10/13/2014

## 2014-10-13 NOTE — Patient Instructions (Signed)
[facility] Discharge Instructions for Patients Receiving Chemotherapy  Today you received the following chemotherapy agents Abraxane.   To help prevent nausea and vomiting after your treatment, we encourage you to take your nausea medication as directed.  Zofran : Take 1 tablet (8 mg total) by mouth 2 (two) times daily. Start the day after chemo for 2 days.  Compazine: Take 1 tablet (10 mg total) by mouth every 6 (six) hours as needed  BELOW ARE SYMPTOMS THAT SHOULD BE REPORTED IMMEDIATELY:  *FEVER GREATER THAN 100.5 F  *CHILLS WITH OR WITHOUT FEVER  NAUSEA AND VOMITING THAT IS NOT CONTROLLED WITH YOUR NAUSEA MEDICATION  *UNUSUAL SHORTNESS OF BREATH  *UNUSUAL BRUISING OR BLEEDING  TENDERNESS IN MOUTH AND THROAT WITH OR WITHOUT PRESENCE OF ULCERS  *URINARY PROBLEMS  *BOWEL PROBLEMS  UNUSUAL RASH Items with * indicate a potential emergency and should be followed up as soon as possible.  Feel free to call the clinic you have any questions or concerns. The clinic phone number is [phone].  Please show the [facility] at check-in to the Emergency Department and triage nurse.   Nanoparticle Albumin-Bound Paclitaxel injection What is this medicine? NANOPARTICLE ALBUMIN-BOUND PACLITAXEL (Na no PAHR ti kuhl al BYOO muhn-bound PAK li TAX el) is a chemotherapy drug. It targets fast dividing cells, like cancer cells, and causes these cells to die. This medicine is used to treat advanced breast cancer and advanced lung cancer. This medicine may be used for other purposes; ask your health care provider or pharmacist if you have questions. COMMON BRAND NAME(S): Abraxane What should I tell my health care provider before I take this medicine? They need to know if you have any of these conditions: -kidney disease -liver disease -low blood counts, like low platelets, red blood cells, or white blood cells -recent or ongoing radiation therapy -an unusual or allergic  reaction to paclitaxel, albumin, other chemotherapy, other medicines, foods, dyes, or preservatives -pregnant or trying to get pregnant -breast-feeding How should I use this medicine? This drug is given as an infusion into a vein. It is administered in a hospital or clinic by a specially trained health care professional. Talk to your pediatrician regarding the use of this medicine in children. Special care may be needed. Overdosage: If you think you have taken too much of this medicine contact a poison control center or emergency room at once. NOTE: This medicine is only for you. Do not share this medicine with others. What if I miss a dose? It is important not to miss your dose. Call your doctor or health care professional if you are unable to keep an appointment. What may interact with this medicine? -cyclosporine -diazepam -ketoconazole -medicines to increase blood counts like filgrastim, pegfilgrastim, sargramostim -other chemotherapy drugs like cisplatin, doxorubicin, epirubicin, etoposide, teniposide, vincristine -quinidine -testosterone -vaccines -verapamil Talk to your doctor or health care professional before taking any of these medicines: -acetaminophen -aspirin -ibuprofen -ketoprofen -naproxen This list may not describe all possible interactions. Give your health care provider a list of all the medicines, herbs, non-prescription drugs, or dietary supplements you use. Also tell them if you smoke, drink alcohol, or use illegal drugs. Some items may interact with your medicine. What should I watch for while using this medicine? Your condition will be monitored carefully while you are receiving this medicine. You will need important blood work done while you are taking this medicine. This drug may make you feel generally unwell. This is not uncommon,  as chemotherapy can affect healthy cells as well as cancer cells. Report any side effects. Continue your course of treatment even  though you feel ill unless your doctor tells you to stop. In some cases, you may be given additional medicines to help with side effects. Follow all directions for their use. Call your doctor or health care professional for advice if you get a fever, chills or sore throat, or other symptoms of a cold or flu. Do not treat yourself. This drug decreases your body's ability to fight infections. Try to avoid being around people who are sick. This medicine may increase your risk to bruise or bleed. Call your doctor or health care professional if you notice any unusual bleeding. Be careful brushing and flossing your teeth or using a toothpick because you may get an infection or bleed more easily. If you have any dental work done, tell your dentist you are receiving this medicine. Avoid taking products that contain aspirin, acetaminophen, ibuprofen, naproxen, or ketoprofen unless instructed by your doctor. These medicines may hide a fever. Do not become pregnant while taking this medicine. Women should inform their doctor if they wish to become pregnant or think they might be pregnant. There is a potential for serious side effects to an unborn child. Talk to your health care professional or pharmacist for more information. Do not breast-feed an infant while taking this medicine. Men are advised not to father a child while receiving this medicine. What side effects may I notice from receiving this medicine? Side effects that you should report to your doctor or health care professional as soon as possible: -allergic reactions like skin rash, itching or hives, swelling of the face, lips, or tongue -low blood counts - This drug may decrease the number of white blood cells, red blood cells and platelets. You may be at increased risk for infections and bleeding. -signs of infection - fever or chills, cough, sore throat, pain or difficulty passing urine -signs of decreased platelets or bleeding - bruising, pinpoint  red spots on the skin, black, tarry stools, nosebleeds -signs of decreased red blood cells - unusually weak or tired, fainting spells, lightheadedness -breathing problems -changes in vision -chest pain -high or low blood pressure -mouth sores -nausea and vomiting -pain, swelling, redness or irritation at the injection site -pain, tingling, numbness in the hands or feet -slow or irregular heartbeat -swelling of the ankle, feet, hands Side effects that usually do not require medical attention (report to your doctor or health care professional if they continue or are bothersome): -aches, pains -changes in the color of fingernails -diarrhea -hair loss -loss of appetite This list may not describe all possible side effects. Call your doctor for medical advice about side effects. You may report side effects to FDA at 1-800-FDA-1088. Where should I keep my medicine? This drug is given in a hospital or clinic and will not be stored at home. NOTE: This sheet is a summary. It may not cover all possible information. If you have questions about this medicine, talk to your doctor, pharmacist, or health care provider.  2015, [employer]. (2012-06-21 16:48:50)

## 2014-10-13 NOTE — Progress Notes (Signed)
Today's treatment will consist of  Abraxane only. No extra fluids will be given with today's treatment per Gentry Fitz NP.

## 2014-10-16 ENCOUNTER — Other Ambulatory Visit: Payer: Self-pay | Admitting: Family

## 2014-10-17 NOTE — Telephone Encounter (Signed)
[name]-- pt last seen 09/15/14 and advised 1 month follow up. Do you still want her to f/u in 1 month or can f/u be postponed a little longer?  Last seen 09/15/14. UDS moderate 05/2014. Needs updated CSC at next office visit.  Rx printed and forwarded to Provider for signature.    Medication name:  Name from pharmacy:  clonazePAM (KLONOPIN) 0.5 MG tablet CLONAZEPAM 0.5 MG TABLET     Sig: TAKE 1/2 TABLET TWICE DAILY AS NEEDED    Dispense: 30 tablet   Start: 10/16/2014   Class: Normal    Requested on: 09/18/2014    Originally ordered on: 08/14/2014 09/19/2014

## 2014-10-17 NOTE — Telephone Encounter (Signed)
Rx was faxed at 12/25pm.  Please advise re: follow up?

## 2014-10-17 NOTE — Telephone Encounter (Signed)
Please call pt to arrange follow up of depression within the next month.  Thanks!

## 2014-10-17 NOTE — Telephone Encounter (Signed)
I would like to see her in the near future for follow up of her depression/wellbutrin.

## 2014-10-17 NOTE — Telephone Encounter (Signed)
Left message for patient to return my call.

## 2014-10-18 NOTE — Telephone Encounter (Signed)
Mailed letter °

## 2014-10-18 NOTE — Telephone Encounter (Signed)
Left message for patient to return my call.

## 2014-10-19 ENCOUNTER — Telehealth: Payer: Self-pay

## 2014-10-19 NOTE — Telephone Encounter (Signed)
LMOVM- checking on pt after new chemotherapy.  Pt to call clinic with any questions or concerns.

## 2014-10-23 ENCOUNTER — Other Ambulatory Visit: Payer: Self-pay | Admitting: *Deleted

## 2014-10-23 MED ORDER — ALPRAZOLAM ER 1 MG PO TB24: 1.0000 mg | ORAL_TABLET | Freq: Two times a day (BID) | ORAL | Status: DC

## 2014-10-25 ENCOUNTER — Other Ambulatory Visit: Payer: Self-pay | Admitting: Diagnostic Radiology

## 2014-10-25 ENCOUNTER — Ambulatory Visit
Admission: RE | Admit: 2014-10-25 | Discharge: 2014-10-25 | Disposition: A | Discharge status: Home / Self Care | Payer: BLUE CROSS/BLUE SHIELD | Source: Ambulatory Visit | Attending: Diagnostic Radiology | Admitting: Diagnostic Radiology

## 2014-10-25 ENCOUNTER — Other Ambulatory Visit: Payer: Self-pay | Admitting: *Deleted

## 2014-10-25 DIAGNOSIS — I82403 Acute embolism and thrombosis of unspecified deep veins of lower extremity, bilateral: Secondary | ICD-10-CM

## 2014-10-25 NOTE — Consult Note (Signed)
Chief Complaint: No chief complaint on file.   Referring Physician(s): Magrinat, Sarajane Jews, MD  History of Present Illness: [name] is a 59 y.o. female with history of breast cancer and recently diagnosed with bilateral lower extremity deep vein thrombosis on 09/25/2014. Interventional radiology was consulted about mobile clot in the right leg which was less mobile on follow-up images. As a result, an IVC filter was not placed during the hospitalization. However, the patient continued to have left calf pain despite anticoagulation and, therefore, mechanical thrombectomy was performed in the left leg. Following the thrombectomy, the patient's swelling and symptoms in the left leg improved. The patient has been at home and currently anticoagulated with Lovenox and wearing knee-high compression hose. The patient does not complain of swelling or pain in the legs. No symptoms at the puncture site behind the knee. She denies any respiratory problems. The patient has followed up with oncology and is planning to start chemotherapy this Friday.  Past Medical History  Diagnosis Date  . Depression   . DVT (deep venous thrombosis)     hx of on HRT left leg ~2006  . GERD (gastroesophageal reflux disease)   . Hyperlipidemia   . Hypertension   . Hypothyroidism   . PPD positive, treated     rx inh   . Diabetes mellitus   . 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 DJD (degenerative joint disease) of lumbar spine   . Chronic combined systolic and diastolic CHF (congestive heart failure)     a. 08/2012 Echo: EF 55-60%, no rwma, mod MR;  b. 07/2014 Echo: EF 45-50%, distal antsept HK, mod TR/MR, mildly bil-atrial enlargement.  . Paroxysmal atrial fibrillation     a. CHA2DS2VASc = 4-->coumadin.  [name] Physical deconditioning   . Allergy   . Anemia     low iron hx  . Cataract     removed ou  . OSA on CPAP     cpap 4.5 setting  . Breast cancer 2014    a. 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 Clear Cell Ovarian Cancer     a. 06/2014 s/p TAH/BSO debulking/lymph node dissection;  b. Now on chemo.    Past Surgical History  Procedure Laterality Date  . Tubal ligation    . Cholecystectomy    . Foot surgery    . Eye surgery    . Cataract extraction    . Percutaneous liver biopsy    . Breast biopsy      left breast  . Mastectomy modified radical  06/08/2012    Procedure: MASTECTOMY MODIFIED RADICAL;  Surgeon: Edward Jolly, MD;  Location: [facility];  Service: General;  Laterality: Right;  . Portacath placement  06/08/2012    Procedure: INSERTION PORT-A-CATH;  Surgeon: Edward Jolly, MD;  Location: [place];  Service: General;  Laterality: Left;  . Peg tube placement      peg removed 2015  . History of chemotherapy x 2 treatments, radiation tx  2014  . Pac removed  2015  . Laparotomy N/A 06/27/2014    Procedure: EXPLORATORY LAPAROTOMY;  Surgeon: Everitt Amber, MD;  Location: [facility];  Service: Gynecology;  Laterality: N/A;  . Abdominal hysterectomy N/A 06/27/2014    Procedure: HYSTERECTOMY ABDOMINAL TOTAL;  Surgeon: Everitt Amber, MD;  Location: [facility];  Service: Gynecology;  Laterality: N/A;  .  Salpingoophorectomy Bilateral 06/27/2014    Procedure: BILATERAL SALPINGO OOPHORECTOMY/TUMOR DEBULKING/LYMPHNODE DISSECTION, OMENTECTOMY;  Surgeon: Everitt Amber, MD;  Location: [facility];  Service: Gynecology;  Laterality: Bilateral;    Allergies: Olmesartan medoxomil; Venlafaxine; Adhesive; and Tetracycline hcl  Medications: Prior to Admission medications   Medication Sig Start Date End Date Taking? Authorizing Provider  ALPRAZolam (XANAX XR) 1 MG 24 hr tablet Take 1 tablet (1 mg total) by mouth 2 (two) times daily. 10/23/14   Chauncey Cruel, MD  buPROPion (WELLBUTRIN XL) 150 MG 24 hr tablet Take 1 tablet (150 mg total) by mouth daily. 09/15/14   Debbrah Alar, NP    clonazePAM (KLONOPIN) 0.5 MG tablet TAKE 1/2 TABLET TWICE DAILY AS NEEDED 10/17/14   Debbrah Alar, NP  docusate sodium (COLACE) 100 MG capsule Take 1 capsule (100 mg total) by mouth 2 (two) times daily. Patient not taking: Reported on 10/13/2014 10/02/14   Barton Dubois, MD  enoxaparin (LOVENOX) 100 MG/ML injection Inject 1 mL (100 mg total) into the skin daily. 10/02/14   Barton Dubois, MD  escitalopram (LEXAPRO) 20 MG tablet Take 20 mg by mouth daily.    Historical Provider, MD  famotidine (PEPCID) 20 MG tablet Take 1 tablet (20 mg total) by mouth daily. Patient not taking: Reported on 10/06/2014 08/08/14   Modena Jansky, MD  furosemide (LASIX) 20 MG tablet Take 1 tablet (20 mg total) by mouth daily. 05/23/14   Debbrah Alar, NP  letrozole (FEMARA) 2.5 MG tablet Take 1 tablet (2.5 mg total) by mouth daily. 05/22/14   Chauncey Cruel, MD  levothyroxine (SYNTHROID, LEVOTHROID) 150 MCG tablet Take 1 tablet (150 mcg total) by mouth daily. 05/23/14   Debbrah Alar, NP  lidocaine-prilocaine (EMLA) cream Apply over port area 1-2 hours before chemotherapy 07/14/14   Chauncey Cruel, MD  metFORMIN (GLUCOPHAGE) 500 MG tablet Take 500 mg by mouth 2 (two) times daily with a meal.    Historical Provider, MD  metoprolol (LOPRESSOR) 50 MG tablet Take 1 tablet (50 mg total) by mouth 2 (two) times daily. 08/25/14   Debbrah Alar, NP  Nutritional Supplements (ENSURE HIGH PROTEIN PO) Take 237 mLs by mouth 2 (two) times daily.    Historical Provider, MD  OLANZapine (ZYPREXA) 2.5 MG tablet Take 2.5 mg by mouth at bedtime.    Historical Provider, MD  ondansetron (ZOFRAN) 8 MG tablet Take 1 tablet (8 mg total) by mouth 2 (two) times daily. Start the day after chemo for 2 days. Then take as needed for nausea or vomiting. 10/13/14   Laurie Panda, NP  polyethylene glycol (MIRALAX / GLYCOLAX) packet Take 17 g by mouth daily. Patient not taking: Reported on 10/06/2014 09/04/14   Charlesetta Shanks, MD   potassium chloride (K-DUR) 10 MEQ tablet Take 10 mEq by mouth 2 (two) times daily. 04/18/14   Historical Provider, MD  prochlorperazine (COMPAZINE) 10 MG tablet Take 1 tablet (10 mg total) by mouth every 6 (six) hours as needed (Nausea or vomiting). Patient not taking: Reported on 10/06/2014 09/25/14   Chauncey Cruel, MD     Family History  Problem Relation Age of Onset  . Diabetes Mother   . Hypertension Mother   . Arthritis Mother   . Heart disease Mother   . Heart failure Mother   . Other Mother     benign breast mass  . Stroke Father   . Heart disease Father   . Diabetes Paternal Grandmother   . Colon cancer Paternal Grandfather  History   Social History  . Marital Status: Married    Spouse Name: N/A  . Number of Children: 2  . Years of Education: N/A   Occupational History  . [place] History Main Topics  . Smoking status: Former Smoker -- 1.00 packs/day for 1 years  . Smokeless tobacco: Never Used  . Alcohol Use: No  . Drug Use: No     Comment: quit age 70 only smoked as a teen 1 year  . Sexual Activity: No   Other Topics Concern  . Not on file   Social History Narrative   Married   Works at [employer] secretary   Daily caffeine use - 2 cups a day plus a couple of sodas a day   Pt doesn't exercise regularly   G2P2   H H of 5 soon to be 6 .     Review of Systems  Constitutional: Negative.   Respiratory: Negative.     Vital Signs: There were no vitals taken for this visit.  Physical Exam  Musculoskeletal:  Both lower extremities are soft and nontender. There is mild swelling in both ankles, left greater right. No evidence for ulcerations. There is a linear red mark on the left upper calf related to an old compression stocking. Otherwise, no significant erythema.       Imaging: Ct Head Wo Contrast  09/28/2014   CLINICAL DATA:  Extensive deep venous thrombosis bilateral legs. History ovarian and breast cancer.   EXAM: CT HEAD WITHOUT CONTRAST  TECHNIQUE: Contiguous axial images were obtained from the base of the skull through the vertex without intravenous contrast.  COMPARISON:  CT head 09/08/2012  FINDINGS: Moderate atrophy has progressed since the prior study. Ventricle size remains within normal limits.  Negative for acute infarct. Negative for hemorrhage or mass lesion. No suggestion of metastatic disease on unenhanced imaging  Atherosclerotic calcification  No skull lesion.  IMPRESSION: Progressive atrophy since 2014.  No acute abnormality.   Electronically Signed   By: Franchot Gallo M.D.   On: 09/28/2014 16:58   Ir Veno/ext/uni Left  09/30/2014   CLINICAL DATA:  59 year old female with bilateral lower extremity DVT and history of ovarian cancer. The patient is currently anticoagulated but continues to have pain in the left calf. Plan for left lower extremity venogram and possible thrombolysis and mechanical thrombectomy.  EXAM: LEFT LOWER EXTREMITY VENOGRAPHY; MECHANICAL VENOUS THROMBECTOMY ; ULTRASOUND GUIDANCE FOR VASCULAR ACCESS  Physician: Stephan Minister. Leland Staszewski, MD  FLUOROSCOPY TIME:  15 minutes and 24 seconds, 354 mGy  MEDICATIONS: 1.5 mg Versed, 75 mcg fentanyl. A radiology nurse monitored the patient for moderate sedation.  ANESTHESIA/SEDATION: Moderate sedation time: 120 minutes  PROCEDURE: The procedure was explained to the patient. Venography, thrombolysis, mechanical thrombectomy and IVC filter placement were discussed with the patient. The risks and benefits of the procedure were discussed and the patient's questions were addressed. Informed consent was obtained from the patient.  The patient has lower extremities were evaluated with ultrasound. Patient's primary symptom is pain and swelling in the left calf. Thrombus in the left popliteal vein was identified. Intended to treat the left popliteal thrombus via a posterior tibial vein. As a result, the left ankle and left popliteal region were prepped and draped  in sterile fashion. Maximal barrier sterile technique was utilized including caps, mask, sterile gowns, sterile gloves, sterile drape, hand hygiene and skin antiseptic. Patient was in a prone position.  Ultrasound demonstrated small thrombosed posterior  tibial veins at the ankle. This area was anesthetized with 1% lidocaine. Multiple attempts were made to cannulate these thrombosed posterior tibial veins with ultrasound guidance. A wire could not be successfully advanced up the calf. As a result, attention was directed to the left popliteal vein. The left popliteal vein was identified with ultrasound. Popliteal fossa was anesthetized with 1% lidocaine. 21 gauge needle was directed into the occluded popliteal vein with ultrasound guidance and a micropuncture dilator set was placed. Eventually, 6 French vascular sheath was placed and a 5 French catheter was advanced up the left leg. Venography demonstrated thrombus in the left thigh. Catheter was advanced into the left iliac veins and iliac/IVC venography was performed.  Attention was directed to performing mechanical thrombectomy of the thrombus in the left common femoral vein and left femoral vein. 6 [place] vascular sheath was exchanged for an 8 [place] vascular sheath. The Angiojet device and an 8 [place] Mach 1 Guide Catheter were advanced over the wire position in the left common femoral vein. Mechanical thrombectomy was performed using the Rapid Lysis technique. 25 mg TPA was combined with 1 liter of normal saline for the mechanical thrombectomy. Approximately 375 mL of fluid was used for this procedure. The Angiojet and Guide Catheter were pulled down to the upper popliteal vein region while spinning the Guide Catheter. Follow-up venography was performed. Residual areas of thrombus in the left femoral vein and the left common femoral vein were identified. These areas were treated again with the Angiojet device. Follow-up venography demonstrated patency of the  left common femoral vein and left femoral vein with a small amount residual clot. The vascular sheath was removed with manual compression.  FINDINGS: Right lower extremity: Ultrasound demonstrated thrombus in the right femoral vein. Minimal clot identified in the right common femoral vein.  Left lower extremity: Thrombus in the left popliteal vein and left posterior tibial veins identified with ultrasound. Unable to successfully cannulate a thrombosed posterior tibial veins with ultrasound. The left popliteal vein was successfully cannulated. Venography demonstrated clot in left common femoral vein and left femoral vein. Left iliac veins and IVC are patent. Following mechanical thrombectomy, the left common femoral vein and left femoral vein were patent with small amount of nonocclusive thrombus. The left iliac veins and IVC were also patent at the end of the procedure.  Estimated blood loss: Minimal  COMPLICATIONS: None  IMPRESSION: Successful mechanical thrombectomy of the deep vein thrombus in the left femoral vein and left common femoral vein. These vessels were patent at the end of the procedure with a small amount of nonocclusive thrombus.  The left popliteal vein thrombus was not treated with mechanical thrombectomy because of difficulty accessing the posterior tibial veins. If the left calf swelling does not improve, we could try to access the posterior tibial veins again.  History of mobile clot in the right common femoral vein. No significant thrombus was identified in the right common femoral vein today. Recommend repeating a venous duplex of this area to confirm this finding.   Electronically Signed   By: Markus Daft M.D.   On: 09/30/2014 19:46   US Venous Img Lower Bilateral  10/25/2014   CLINICAL DATA:  59 year old with bilateral lower extremity DVT. Status post mechanical thrombectomy to the deep vein thrombosis in the left common femoral vein and left femoral vein. Patient presents for follow-up.   EXAM: BILATERAL LOWER EXTREMITY VENOUS DOPPLER ULTRASOUND  TECHNIQUE: Gray-scale sonography with graded compression, as well as color Doppler and duplex  ultrasound were performed to evaluate the lower extremity deep venous systems from the level of the common femoral vein and including the common femoral, femoral, profunda femoral, popliteal and calf veins including the posterior tibial, peroneal and gastrocnemius veins when visible. The superficial great saphenous vein was also interrogated. Spectral Doppler was utilized to evaluate flow at rest and with distal augmentation maneuvers in the common femoral, femoral and popliteal veins.  COMPARISON:  Venous duplex report from [facility] dated 10/01/2014. Images not available.  FINDINGS: RIGHT LOWER EXTREMITY  The proximal right common femoral vein is compressible and patent. There is nonocclusive thrombus in the distal right common femoral vein at the bifurcation of the femoral vein and deep femoral vein. This nonocclusive thrombus is mildly mobile and likely reflects the previously described mobile clot. There is no significant compressibility in the right femoral vein. There is at least partial compressibility of the right posterior tibial veins. The right saphenofemoral junction is patent. Color Doppler flow in the right profunda femoral vein. Small amount of blood flow in the proximal right femoral vein. There is nonocclusive thrombus in the right popliteal vein. Right great saphenous vein is compressible and patent.  LEFT LOWER EXTREMITY  The proximal left common femoral vein is compressible. Nonocclusive thrombus in the left common femoral vein at the level of the saphenofemoral junction. There is thrombus extending into the proximal left great saphenous vein. The left femoral vein is non compressible. Left popliteal vein is non compressible. There appears to be at least partial compressibility of the left posterior tibial veins. The proximal left  common femoral vein has color Doppler flow. There appears to be nonocclusive thrombus in the proximal left profunda femoral vein. Minimal flow identified in the left femoral vein. Small amount of flow in the left popliteal vein. Although there may be partial compressibility of the left calf veins, there was no significant color Doppler flow. The proximal left great saphenous vein contains thrombus. The great saphenous vein distal to the proximal thigh is compressible and patent.  Other Findings:  None.  IMPRESSION: Persistent bilateral lower extremity deep vein thrombosis. The left lower extremity thrombus extends from the left saphenofemoral junction to the left popliteal vein and difficult to exclude calf thrombus. There is also superficial thrombosis of the proximal left great saphenous vein. The left lower extremity deep vein thrombosis has minimally changed from the previous examination.  Persistent deep vein thrombosis in the right lower extremity extending from the distal right common femoral vein to the right popliteal vein. Difficult to evaluate for right calf thrombus. Again noted is a nonocclusive and mobile clot in the right common femoral vein region which was previously described on the prior examination.   Electronically Signed   By: Markus Daft M.D.   On: 10/25/2014 10:35   Ir Thrombect Veno Mech Mod Sed  09/30/2014   CLINICAL DATA:  59 year old female with bilateral lower extremity DVT and history of ovarian cancer. The patient is currently anticoagulated but continues to have pain in the left calf. Plan for left lower extremity venogram and possible thrombolysis and mechanical thrombectomy.  EXAM: LEFT LOWER EXTREMITY VENOGRAPHY; MECHANICAL VENOUS THROMBECTOMY ; ULTRASOUND GUIDANCE FOR VASCULAR ACCESS  Physician: Stephan Minister. Arianis Bowditch, MD  FLUOROSCOPY TIME:  15 minutes and 24 seconds, 354 mGy  MEDICATIONS: 1.5 mg Versed, 75 mcg fentanyl. A radiology nurse monitored the patient for moderate sedation.   ANESTHESIA/SEDATION: Moderate sedation time: 120 minutes  PROCEDURE: The procedure was explained to the patient. Venography,  thrombolysis, mechanical thrombectomy and IVC filter placement were discussed with the patient. The risks and benefits of the procedure were discussed and the patient's questions were addressed. Informed consent was obtained from the patient.  The patient has lower extremities were evaluated with ultrasound. Patient's primary symptom is pain and swelling in the left calf. Thrombus in the left popliteal vein was identified. Intended to treat the left popliteal thrombus via a posterior tibial vein. As a result, the left ankle and left popliteal region were prepped and draped in sterile fashion. Maximal barrier sterile technique was utilized including caps, mask, sterile gowns, sterile gloves, sterile drape, hand hygiene and skin antiseptic. Patient was in a prone position.  Ultrasound demonstrated small thrombosed posterior tibial veins at the ankle. This area was anesthetized with 1% lidocaine. Multiple attempts were made to cannulate these thrombosed posterior tibial veins with ultrasound guidance. A wire could not be successfully advanced up the calf. As a result, attention was directed to the left popliteal vein. The left popliteal vein was identified with ultrasound. Popliteal fossa was anesthetized with 1% lidocaine. 21 gauge needle was directed into the occluded popliteal vein with ultrasound guidance and a micropuncture dilator set was placed. Eventually, 6 French vascular sheath was placed and a 5 French catheter was advanced up the left leg. Venography demonstrated thrombus in the left thigh. Catheter was advanced into the left iliac veins and iliac/IVC venography was performed.  Attention was directed to performing mechanical thrombectomy of the thrombus in the left common femoral vein and left femoral vein. 6 [place] vascular sheath was exchanged for an 8 [place] vascular sheath. The  Angiojet device and an 8 [place] Mach 1 Guide Catheter were advanced over the wire position in the left common femoral vein. Mechanical thrombectomy was performed using the Rapid Lysis technique. 25 mg TPA was combined with 1 liter of normal saline for the mechanical thrombectomy. Approximately 375 mL of fluid was used for this procedure. The Angiojet and Guide Catheter were pulled down to the upper popliteal vein region while spinning the Guide Catheter. Follow-up venography was performed. Residual areas of thrombus in the left femoral vein and the left common femoral vein were identified. These areas were treated again with the Angiojet device. Follow-up venography demonstrated patency of the left common femoral vein and left femoral vein with a small amount residual clot. The vascular sheath was removed with manual compression.  FINDINGS: Right lower extremity: Ultrasound demonstrated thrombus in the right femoral vein. Minimal clot identified in the right common femoral vein.  Left lower extremity: Thrombus in the left popliteal vein and left posterior tibial veins identified with ultrasound. Unable to successfully cannulate a thrombosed posterior tibial veins with ultrasound. The left popliteal vein was successfully cannulated. Venography demonstrated clot in left common femoral vein and left femoral vein. Left iliac veins and IVC are patent. Following mechanical thrombectomy, the left common femoral vein and left femoral vein were patent with small amount of nonocclusive thrombus. The left iliac veins and IVC were also patent at the end of the procedure.  Estimated blood loss: Minimal  COMPLICATIONS: None  IMPRESSION: Successful mechanical thrombectomy of the deep vein thrombus in the left femoral vein and left common femoral vein. These vessels were patent at the end of the procedure with a small amount of nonocclusive thrombus.  The left popliteal vein thrombus was not treated with mechanical thrombectomy  because of difficulty accessing the posterior tibial veins. If the left calf swelling does not improve, we could  try to access the posterior tibial veins again.  History of mobile clot in the right common femoral vein. No significant thrombus was identified in the right common femoral vein today. Recommend repeating a venous duplex of this area to confirm this finding.   Electronically Signed   By: Markus Daft M.D.   On: 09/30/2014 19:46   Ir US Guide Vasc Access Left  09/30/2014   CLINICAL DATA:  59 year old female with bilateral lower extremity DVT and history of ovarian cancer. The patient is currently anticoagulated but continues to have pain in the left calf. Plan for left lower extremity venogram and possible thrombolysis and mechanical thrombectomy.  EXAM: LEFT LOWER EXTREMITY VENOGRAPHY; MECHANICAL VENOUS THROMBECTOMY ; ULTRASOUND GUIDANCE FOR VASCULAR ACCESS  Physician: Stephan Minister. Raeden Belzer, MD  FLUOROSCOPY TIME:  15 minutes and 24 seconds, 354 mGy  MEDICATIONS: 1.5 mg Versed, 75 mcg fentanyl. A radiology nurse monitored the patient for moderate sedation.  ANESTHESIA/SEDATION: Moderate sedation time: 120 minutes  PROCEDURE: The procedure was explained to the patient. Venography, thrombolysis, mechanical thrombectomy and IVC filter placement were discussed with the patient. The risks and benefits of the procedure were discussed and the patient's questions were addressed. Informed consent was obtained from the patient.  The patient has lower extremities were evaluated with ultrasound. Patient's primary symptom is pain and swelling in the left calf. Thrombus in the left popliteal vein was identified. Intended to treat the left popliteal thrombus via a posterior tibial vein. As a result, the left ankle and left popliteal region were prepped and draped in sterile fashion. Maximal barrier sterile technique was utilized including caps, mask, sterile gowns, sterile gloves, sterile drape, hand hygiene and skin antiseptic.  Patient was in a prone position.  Ultrasound demonstrated small thrombosed posterior tibial veins at the ankle. This area was anesthetized with 1% lidocaine. Multiple attempts were made to cannulate these thrombosed posterior tibial veins with ultrasound guidance. A wire could not be successfully advanced up the calf. As a result, attention was directed to the left popliteal vein. The left popliteal vein was identified with ultrasound. Popliteal fossa was anesthetized with 1% lidocaine. 21 gauge needle was directed into the occluded popliteal vein with ultrasound guidance and a micropuncture dilator set was placed. Eventually, 6 French vascular sheath was placed and a 5 French catheter was advanced up the left leg. Venography demonstrated thrombus in the left thigh. Catheter was advanced into the left iliac veins and iliac/IVC venography was performed.  Attention was directed to performing mechanical thrombectomy of the thrombus in the left common femoral vein and left femoral vein. 6 [place] vascular sheath was exchanged for an 8 [place] vascular sheath. The Angiojet device and an 8 [place] Mach 1 Guide Catheter were advanced over the wire position in the left common femoral vein. Mechanical thrombectomy was performed using the Rapid Lysis technique. 25 mg TPA was combined with 1 liter of normal saline for the mechanical thrombectomy. Approximately 375 mL of fluid was used for this procedure. The Angiojet and Guide Catheter were pulled down to the upper popliteal vein region while spinning the Guide Catheter. Follow-up venography was performed. Residual areas of thrombus in the left femoral vein and the left common femoral vein were identified. These areas were treated again with the Angiojet device. Follow-up venography demonstrated patency of the left common femoral vein and left femoral vein with a small amount residual clot. The vascular sheath was removed with manual compression.  FINDINGS: Right lower extremity:  Ultrasound demonstrated thrombus in  the right femoral vein. Minimal clot identified in the right common femoral vein.  Left lower extremity: Thrombus in the left popliteal vein and left posterior tibial veins identified with ultrasound. Unable to successfully cannulate a thrombosed posterior tibial veins with ultrasound. The left popliteal vein was successfully cannulated. Venography demonstrated clot in left common femoral vein and left femoral vein. Left iliac veins and IVC are patent. Following mechanical thrombectomy, the left common femoral vein and left femoral vein were patent with small amount of nonocclusive thrombus. The left iliac veins and IVC were also patent at the end of the procedure.  Estimated blood loss: Minimal  COMPLICATIONS: None  IMPRESSION: Successful mechanical thrombectomy of the deep vein thrombus in the left femoral vein and left common femoral vein. These vessels were patent at the end of the procedure with a small amount of nonocclusive thrombus.  The left popliteal vein thrombus was not treated with mechanical thrombectomy because of difficulty accessing the posterior tibial veins. If the left calf swelling does not improve, we could try to access the posterior tibial veins again.  History of mobile clot in the right common femoral vein. No significant thrombus was identified in the right common femoral vein today. Recommend repeating a venous duplex of this area to confirm this finding.   Electronically Signed   By: Markus Daft M.D.   On: 09/30/2014 19:46    Labs:  CBC:  Recent Labs  10/01/14 0555 10/02/14 0415 10/06/14 0753 10/13/14 0829  WBC 4.9 5.3 4.2 4.7  HGB 11.5* 11.0* 11.6 11.8  HCT 35.7* 34.4* 36.1 36.2  PLT 196 183 154 142*    COAGS:  Recent Labs  07/30/14 0430 07/31/14 0500 08/01/14 0434  08/03/14 0450  10/01/14 0555 10/02/14 0415 10/06/14 0753 10/13/14 0829  INR 2.44* 1.90* 1.63*  < > 1.84*  < > 1.40 1.46 1.20* 1.20*  APTT 35 31 35  --  31   --   --   --   --   --   < > = values in this interval not displayed.  BMP:  Recent Labs  08/18/14 1320  09/26/14 0358 09/30/14 0408 10/02/14 0415 10/06/14 0753 10/13/14 0830  NA 136  < > 137 138 135 138 140  K 4.0  < > 3.0* 3.9 3.8 3.4* 3.9  CL 103  --  108 109 106  --   --   CO2 24  < > 23 20* 20* 22 26  GLUCOSE 72  < > 89 121* 101* 106 84  BUN 5*  < > 8 6 6  7.2 6.1*  CALCIUM 7.9*  < > 7.8* 8.5* 8.4* 8.6 8.7  CREATININE 0.68  < > 0.35* 0.42* 0.45 0.6 0.6  GFRNONAA >90  --  >60 >60 >60  --   --   GFRAA >90  --  >60 >60 >60  --   --   < > = values in this interval not displayed.  LIVER FUNCTION TESTS:  Recent Labs  09/08/14 1238 09/25/14 1043 10/06/14 0753 10/13/14 0830  BILITOT 1.11 1.06 0.70 0.71  AST 36* 31 62* 55*  ALT 22 15 25 22   ALKPHOS 166* 130 177* 193*  PROT 6.4 6.7 7.0 6.8  ALBUMIN 2.7* 2.7* 2.9* 2.9*    TUMOR MARKERS:  Recent Labs  06/05/14 5397  CEA 2.1    Assessment and Plan:  Bilateral lower extremity DVT and currently anticoagulated with Lovenox. The patient has mild swelling in both ankles, left side greater  than right but she is asymptomatic. The follow-up venous duplex examination demonstrates residual bilateral DVT extending from the common femoral veins to the popliteal veins. The calf veins are difficult to evaluate. Some of the clot is a nonocclusive. However, there continues to be mobile thrombus in the distal right common femoral vein region. Dr. Jacqulynn Cadet reviewed these images because he is also saw the prior images at the hospital. He feels that the clot is more mobile on today's examination. I discussed these findings with Dr. Rande Lawman and we feel that the patient would benefit from a retrieval IVC filter.  Anticipate placing an IVC filter and removing once the mobile clot resolves or stabilizes.    Plan for IVC filter placement as outpatient tomorrow (10/26/2014) at [facility].    Signed: Anselm Pancoast, Halynn Reitano  Thurmond Butts 10/25/2014, 2:15 PM   I spent a total of  15 Minutes in face to face in clinical consultation, greater than 50% of which was counseling/coordinating care for bilateral lower extremity DVTs.

## 2014-10-26 ENCOUNTER — Other Ambulatory Visit: Payer: Self-pay | Admitting: Oncology

## 2014-10-26 ENCOUNTER — Encounter (HOSPITAL_COMMUNITY): Payer: Self-pay

## 2014-10-26 ENCOUNTER — Ambulatory Visit (HOSPITAL_COMMUNITY)
Admission: RE | Admit: 2014-10-26 | Discharge: 2014-10-26 | Disposition: A | Discharge status: Home / Self Care | Payer: BLUE CROSS/BLUE SHIELD | Source: Ambulatory Visit | Attending: Diagnostic Radiology | Admitting: Diagnostic Radiology

## 2014-10-26 ENCOUNTER — Other Ambulatory Visit: Payer: Self-pay | Admitting: Radiology

## 2014-10-26 DIAGNOSIS — I82403 Acute embolism and thrombosis of unspecified deep veins of lower extremity, bilateral: Secondary | ICD-10-CM

## 2014-10-26 DIAGNOSIS — I824Z3 Acute embolism and thrombosis of unspecified deep veins of distal lower extremity, bilateral: Secondary | ICD-10-CM | POA: Diagnosis not present

## 2014-10-26 LAB — GLUCOSE, CAPILLARY: Glucose-Capillary: 69 mg/dL (ref 65–99)

## 2014-10-26 MED ORDER — LIDOCAINE HCL 1 % IJ SOLN
INTRAMUSCULAR | Status: DC
Start: 2014-10-26 — End: 2014-10-27
  Filled 2014-10-26: qty 20

## 2014-10-26 MED ORDER — MIDAZOLAM HCL 2 MG/2ML IJ SOLN
INTRAMUSCULAR | Status: AC | PRN
  Administered 2014-10-26 (×2): 0.5 mg via INTRAVENOUS

## 2014-10-26 MED ORDER — MIDAZOLAM HCL 2 MG/2ML IJ SOLN
INTRAMUSCULAR | Status: AC
  Filled 2014-10-26: qty 2

## 2014-10-26 MED ORDER — FENTANYL CITRATE (PF) 100 MCG/2ML IJ SOLN
INTRAMUSCULAR | Status: AC
  Filled 2014-10-26: qty 2

## 2014-10-26 MED ORDER — IOHEXOL 300 MG/ML  SOLN
55.0000 mL | Freq: Once | INTRAMUSCULAR | Status: AC | PRN
  Administered 2014-10-26: 55 mL via INTRAVENOUS

## 2014-10-26 MED ORDER — FENTANYL CITRATE (PF) 100 MCG/2ML IJ SOLN
INTRAMUSCULAR | Status: AC | PRN
  Administered 2014-10-26 (×2): 25 ug via INTRAVENOUS

## 2014-10-26 MED ORDER — HYDROCODONE-ACETAMINOPHEN 5-325 MG PO TABS: 1.0000 | ORAL_TABLET | ORAL | Status: DC | PRN

## 2014-10-26 MED ORDER — SODIUM CHLORIDE 0.9 % IV SOLN
INTRAVENOUS | Status: DC
  Administered 2014-10-26: 13:00:00 via INTRAVENOUS

## 2014-10-26 NOTE — Procedures (Signed)
Post-Procedure Note  Pre-operative Diagnosis: Bilateral lower extremity DVT with mobile clot in right leg       Post-operative Diagnosis: same   Indications: Mobile DVT and PE  risk  Procedure Details:   IVC venogram performed via right IJ access.  [place] IVC filter placed in the infrarenal IVC.   Post placement venogram.  Findings: Patent IVC.  Filter in infrarenal IVC.  Complications: None  Blood loss:  Minimal     Condition: Good  Plan: Discharge home later today.  Follow up venous duplex in 3 months to evaluate for filter removal.

## 2014-10-26 NOTE — Discharge Instructions (Signed)
Hold Metformin/Glucaphage for 48 hours after procedure.  May resume Saturday at 4pm (June 18)   Inferior Vena Cava Filter Insertion, Care After Refer to this sheet in the next few weeks. These instructions provide you with information on caring for yourself after your procedure. Your health care provider may also give you more specific instructions. Your treatment has been planned according to current medical practices, but problems sometimes occur. Call your health care provider if you have any problems or questions after your procedure. WHAT TO EXPECT AFTER THE PROCEDURE After your procedure, it is typical to have the following:  Mild pain in the area where the filter was inserted.  Mild bruising in the area where the filter was inserted. HOME CARE INSTRUCTIONS  You will be given medicine to control pain. Only take over-the-counter or prescription medicines for pain, fever, or discomfort as directed by your health care provider.  A bandage (dressing) has been placed over the insertion site. Follow your health care provider's instructions on how to care for it.  Keep the insertion site clean and dry.  Do not soak in a bath tub or pool until the filter insertion site has healed.  Do not drive if you are taking narcotic pain medicines. Follow your health care provider's instructions about driving.  Do not return to work or school until your health care provider says it is okay.   Keep all follow-up appointments.  SEEK IMMEDIATE MEDICAL CARE IF:  You develop swelling and discoloration or pain in the legs.  Your legs become pale and cold or blue.  You develop shortness of breath, feel faint, or pass out.  You develop chest pain, a cough, or difficulty breathing.  You cough up blood.  You develop a rash or feel you are having problems that may be a side effect of medicines.  You develop weakness, difficulty moving your arms or legs, or balance problems.  You develop problems  with speech or vision. Document Released: 02/16/2013 Document Reviewed: 02/16/2013 [facility] Patient Information 2015 [place], [place]. This information is not intended to replace advice given to you by your health care provider. Make sure you discuss any questions you have with your health care provider. Inferior Vena Cava Filter Insertion Insertion of an inferior vena cava (IVC) filter is a procedure in which a filter is placed into the large vein in your abdomen that carries blood from the lower part of your body to your heart (inferior vena cava). Placement of the filter here helps prevent blood clots in the legs or pelvis from traveling to your lungs. A large blood clot in the lungs can cause death.  The filter is a small, metal device about an inch long. It is shaped like the spokes of an umbrella and is inserted through a pathway created in your neck or groin. The risks of this procedure are usually small and easily managed. Inferior vena cava filters are only used when blood thinners (anticoagulants) cannot be used to prevent blood clots from forming. This may occur because of:  You have severe platelet problems or shortage.  You have had recent or current major bleeding that cannot be treated.  You have bleeding associated with anticoagulants.  You have recurrence of blood clots while on anticoagulants.  You have a need for surgery in the near future.  You have bleeding in your head. EXPECTATIONS OF A FILTER  An IVC filter will reduce the risk of a large blood clot making its way to your lungs (  pulmonary embolus, or PE). It cannot eliminate the risk completely, or prevent small PEs from occurring.  It does not stop blood clots from growing, recurring, or developing into postphlebitic syndrome. This is a condition that can occur after there is inflammation in a vein (phlebitis). LET [facility] CARE PROVIDER KNOW ABOUT:  Any allergies you have. This includes an allergy to iodine or  contrast dye.  All medicines you are taking, including vitamins, herbs, eye drops, creams and over-the-counter medicines.  Previous problems you or members of your family have had with the use of anesthetics.  Any blood disorders you have.  Previous surgeries you had had.  Medical conditions you have.  Possibility of pregnancy, if this applies. RISKS AND COMPLICATIONS Generally, this is a safe procedure. However, as with any procedure, problems can occur. Possible problems include:  A small bruise around the needle insertion site. A larger pooling of blood called a hematoma may form. This is usually of no concern.  The filter can block the vena cava. This can cause some swelling of the legs.  The filter may eventually fail and not work properly.  Continued bleeding or infections (uncommon).  Damage to the vein by the catheter (rare). BEFORE THE PROCEDURE  Your health care provider may want you to have blood tests. These tests can help tell how well your kidneys and liver are working. They can also show how well your blood clots.  If you take blood thinners, ask your health care provider if and when you should stop taking them.  Do not eat or drink for 4 hours before the procedure or as directed by your health care provider.  Make arrangements for someone to drive you home. Depending on the procedure, you may be able to go home the same day.  PROCEDURE   Insertion of a Vena Cava filter is performed by a vascular surgeon or cardiologist in a cardiac catheterization lab.  The procedure usually takes about 30 minutes to 1 hour. This can vary.  An IV needle will be inserted into one of your veins. Medicine will be able to flow directly into your body through this needle.  Medicines may given to help you relax and relieve anxiety (sedative).  The procedure is done through a large vein either in your neck or groin. The skin around this area is cleaned and shaved, as  necessary.  The skin and deeper tissues over the vein will be made numb with a local anesthetic. You are awake during the procedure and can let your health care providers know if you have discomfort.  A needle is then put into the vein. A guide wire is placed through the needle and into the vein. This is used to help insert a catheter and the IVC filter into your vein.  Contrast dye may be injected into the inferior vena cava to help guide the catheter and verify precise placement of the IVC filter. X-ray equipment may also be used to verify that the catheter and the wire are in the correct position.  The wire is then withdrawn.  The IVC filter is then passed over the catheter into the vein, and inserted into the correct location in the vena cava.  The catheter is then removed. Pressure will be kept on the needle insertion point for several minutes or until it is unlikely to bleed. AFTER THE PROCEDURE  You will stay in a recovery area until any sedation medicine you were given has worn off. Your blood  pressure and pulse will be checked.  If there are no problems, you should be able to go home after the procedure.  You may feel sore at the area of the needle insertion for a few days. Document Released: 06/18/2005 Document Revised: 05/03/2013 Document Reviewed: 01/03/2013 [facility] Patient Information 2015 [place], [place]. This information is not intended to replace advice given to you by your health care provider. Make sure you discuss any questions you have with your health care provider. Conscious Sedation Sedation is the use of medicines to promote relaxation and relieve discomfort and anxiety. Conscious sedation is a type of sedation. Under conscious sedation you are less alert than normal but are still able to respond to instructions or stimulation. Conscious sedation is used during short medical and dental procedures. It is milder than deep sedation or general anesthesia and allows you to  return to your regular activities sooner.  LET [facility] CARE PROVIDER KNOW ABOUT:   Any allergies you have.  All medicines you are taking, including vitamins, herbs, eye drops, creams, and over-the-counter medicines.  Use of steroids (by mouth or creams).  Previous problems you or members of your family have had with the use of anesthetics.  Any blood disorders you have.  Previous surgeries you have had.  Medical conditions you have.  Possibility of pregnancy, if this applies.  Use of cigarettes, alcohol, or illegal drugs. RISKS AND COMPLICATIONS Generally, this is a safe procedure. However, as with any procedure, problems can occur. Possible problems include:  Oversedation.  Trouble breathing on your own. You may need to have a breathing tube until you are awake and breathing on your own.  Allergic reaction to any of the medicines used for the procedure. BEFORE THE PROCEDURE  You may have blood tests done. These tests can help show how well your kidneys and liver are working. They can also show how well your blood clots.  A physical exam will be done.  Only take medicines as directed by your health care provider. You may need to stop taking medicines (such as blood thinners, aspirin, or nonsteroidal anti-inflammatory drugs) before the procedure.   Do not eat or drink at least 6 hours before the procedure or as directed by your health care provider.  Arrange for a responsible adult, family member, or friend to take you home after the procedure. He or she should stay with you for at least 24 hours after the procedure, until the medicine has worn off. PROCEDURE   An intravenous (IV) catheter will be inserted into one of your veins. Medicine will be able to flow directly into your body through this catheter. You may be given medicine through this tube to help prevent pain and help you relax.  The medical or dental procedure will be done. AFTER THE PROCEDURE  You will  stay in a recovery area until the medicine has worn off. Your blood pressure and pulse will be checked.   Depending on the procedure you had, you may be allowed to go home when you can tolerate liquids and your pain is under control. Document Released: 01/21/2001 Document Revised: 05/03/2013 Document Reviewed: 01/03/2013 [facility] Patient Information 2015 [place], [place]. This information is not intended to replace advice given to you by your health care provider. Make sure you discuss any questions you have with your health care provider.   Outpatient Metformin Instructions (Glucophage, Glucovance, Fortamet, Riomet, Metaglip, Glumetza, Actoplus met  Avandamet, Janumet)   Patient: [name]  10/26/2014:    Radiology Exam:     As part of your exam today in the Radiology Department, you were given a radiographic contrast material or x-ray dye.  Because you have had this contrast material and you are taking a Metformin drug (Glucophage, Glucovance, Avandamet, Fortamet, Riomet, Metaglip, Glumetza, Actoplus met, Actoplus Met XR, Prandimet or Janumet), please observe the following instructions:   DO NOT  Take this medication for 48 hours after your exam.  Because you have normal renal function and have no comorbidities, you may restart your medication in 48 hours with no need for a renal function test or consultation with your physician.  You have normal renal function but have some comorbidities.  Comorbidities include liver disease, alcohol overuse, heart failure, myocardial or muscular ischemia, sepsis, or other severe infection.  Therefore you should consult your physician before restarting your medication.  You have impaired renal function.  You should consult your physician before restarting your medication and you are advised to get a renal function test before restarting your medication.  Please discuss this with your  physician.   Call your doctor before you start taking this medication again.  Your doctor may want to check your kidney function before you start taking this medication again.  I understand these instructions and have had an opportunity to discuss them with Radiology Department personnel.      Conscious Sedation, Adult, Care After Refer to this sheet in the next few weeks. These instructions provide you with information on caring for yourself after your procedure. Your health care provider may also give you more specific instructions. Your treatment has been planned according to current medical practices, but problems sometimes occur. Call your health care provider if you have any problems or questions after your procedure. WHAT TO EXPECT AFTER THE PROCEDURE  After your procedure:  You may feel sleepy, clumsy, and have poor balance for several hours.  Vomiting may occur if you eat too soon after the procedure. HOME CARE INSTRUCTIONS  Do not participate in any activities where you could become injured for at least 24 hours. Do not:  Drive.  Swim.  Ride a bicycle.  Operate heavy machinery.  Cook.  Use power tools.  Climb ladders.  Work from a high place.  Do not make important decisions or sign legal documents until you are improved.  If you vomit, drink water, juice, or soup when you can drink without vomiting. Make sure you have little or no nausea before eating solid foods.  Only take over-the-counter or prescription medicines for pain, discomfort, or fever as directed by your health care provider.  Make sure you and your family fully understand everything about the medicines given to you, including what side effects may occur.  You should not drink alcohol, take sleeping pills, or take medicines that cause drowsiness for at least 24 hours.  If you smoke, do not smoke without supervision.  If you are feeling better, you may resume normal activities 24 hours after  you were sedated.  Keep all appointments with your health care provider. SEEK MEDICAL CARE IF:  Your skin is pale or bluish in color.  You continue to feel nauseous or vomit.  Your pain is getting worse and is not helped by medicine.  You have bleeding or swelling.  You are still sleepy or feeling clumsy after 24 hours. SEEK IMMEDIATE MEDICAL CARE IF:  You develop a rash.  You have difficulty breathing.  You develop any type of allergic  problem.  You have a fever. MAKE SURE YOU:  Understand these instructions.  Will watch your condition.  Will get help right away if you are not doing well or get worse. Document Released: 02/16/2013 Document Reviewed: 02/16/2013 [facility] Patient Information 2015 [place], [place]. This information is not intended to replace advice given to you by your health care provider. Make sure you discuss any questions you have with your health care provider.

## 2014-10-26 NOTE — Progress Notes (Signed)
   The patient has had a H&P performed within the last 30 days, all history, medications, and exam have been reviewed. The patient denies any interval changes since the H&P.  Filed Vitals:   10/26/14 1233  BP: 116/58  Pulse: 70  Temp: 97.7 F (36.5 C)  Resp: 16   Physical Exam  Constitutional: She is oriented to person, place, and time. No distress.  HENT:  Head: Normocephalic and atraumatic.  Cardiovascular: Normal rate and regular rhythm.  Exam reveals no gallop and no friction rub.   No murmur heard. Pulmonary/Chest: Effort normal and breath sounds normal. No respiratory distress. She has no wheezes. She has no rales.  Abdominal: Soft.  Neurological: She is alert and oriented to person, place, and time.  Skin: She is not diaphoretic.   Mallampati Score:  MD Evaluation Airway: WNL Heart: WNL Abdomen: WNL Chest/ Lungs: WNL ASA  Classification: 3 Mallampati/Airway Score: Two  Assessment/Plan:  Bilateral LE DVT on Lovenox-tolerating without any signs of bleeding Recent venous duplex revealed mobile DVT in RLE Seen in consult on 10/25/14 Scheduled today for image guided retrievable IVC filter placement with sedation The patient has been NPO, labs and vitals have been reviewed. Risks and Benefits discussed with the patient including, but not limited to bleeding, infection, contrast induced renal failure, filter fracture or migration which can lead to emergency surgery or even death, strut penetration with damage or irritation to adjacent structures and caval thrombosis. All of the patient's questions were answered, patient is agreeable to proceed. Consent signed and in chart. Patient will F/U in 3 months to IR clinic with repeat ultrasound imaging to discuss removal of IVC filter.  Tsosie Billing PA-C Interventional Radiology  10/26/14  2:17 PM

## 2014-10-27 ENCOUNTER — Ambulatory Visit (HOSPITAL_BASED_OUTPATIENT_CLINIC_OR_DEPARTMENT_OTHER): Payer: BLUE CROSS/BLUE SHIELD | Admitting: Oncology

## 2014-10-27 ENCOUNTER — Other Ambulatory Visit: Payer: Self-pay

## 2014-10-27 ENCOUNTER — Telehealth: Payer: Self-pay | Admitting: Oncology

## 2014-10-27 ENCOUNTER — Ambulatory Visit (HOSPITAL_BASED_OUTPATIENT_CLINIC_OR_DEPARTMENT_OTHER): Payer: BLUE CROSS/BLUE SHIELD

## 2014-10-27 ENCOUNTER — Other Ambulatory Visit (HOSPITAL_BASED_OUTPATIENT_CLINIC_OR_DEPARTMENT_OTHER): Payer: BLUE CROSS/BLUE SHIELD

## 2014-10-27 VITALS — BP 120/58 | HR 73 | Temp 97.8°F | Resp 18 | Ht 65.0 in | Wt 162.7 lb

## 2014-10-27 DIAGNOSIS — I4891 Unspecified atrial fibrillation: Secondary | ICD-10-CM

## 2014-10-27 DIAGNOSIS — C50911 Malignant neoplasm of unspecified site of right female breast: Secondary | ICD-10-CM

## 2014-10-27 DIAGNOSIS — Z5189 Encounter for other specified aftercare: Secondary | ICD-10-CM | POA: Diagnosis not present

## 2014-10-27 DIAGNOSIS — Z5111 Encounter for antineoplastic chemotherapy: Secondary | ICD-10-CM | POA: Diagnosis not present

## 2014-10-27 DIAGNOSIS — K760 Fatty (change of) liver, not elsewhere classified: Secondary | ICD-10-CM

## 2014-10-27 DIAGNOSIS — C569 Malignant neoplasm of unspecified ovary: Secondary | ICD-10-CM | POA: Diagnosis not present

## 2014-10-27 DIAGNOSIS — I82409 Acute embolism and thrombosis of unspecified deep veins of unspecified lower extremity: Secondary | ICD-10-CM

## 2014-10-27 DIAGNOSIS — I824Z3 Acute embolism and thrombosis of unspecified deep veins of distal lower extremity, bilateral: Secondary | ICD-10-CM | POA: Diagnosis not present

## 2014-10-27 DIAGNOSIS — Z170 Estrogen receptor positive status [ER+]: Secondary | ICD-10-CM

## 2014-10-27 DIAGNOSIS — Z7901 Long term (current) use of anticoagulants: Secondary | ICD-10-CM

## 2014-10-27 LAB — COMPREHENSIVE METABOLIC PANEL (CC13)
ALK PHOS: 173 U/L — AB (ref 40–150)
ALT: 22 U/L (ref 0–55)
AST: 42 U/L — AB (ref 5–34)
Albumin: 2.8 g/dL — ABNORMAL LOW (ref 3.5–5.0)
Anion Gap: 6 mEq/L (ref 3–11)
BUN: 6.1 mg/dL — ABNORMAL LOW (ref 7.0–26.0)
CO2: 25 meq/L (ref 22–29)
Calcium: 8.5 mg/dL (ref 8.4–10.4)
Chloride: 109 mEq/L (ref 98–109)
Creatinine: 0.6 mg/dL (ref 0.6–1.1)
GLUCOSE: 103 mg/dL (ref 70–140)
Potassium: 3.8 mEq/L (ref 3.5–5.1)
SODIUM: 139 meq/L (ref 136–145)
TOTAL PROTEIN: 6.2 g/dL — AB (ref 6.4–8.3)
Total Bilirubin: 0.58 mg/dL (ref 0.20–1.20)

## 2014-10-27 LAB — CBC WITH DIFFERENTIAL/PLATELET
BASO%: 0.6 % (ref 0.0–2.0)
BASOS ABS: 0 10*3/uL (ref 0.0–0.1)
EOS%: 1.9 % (ref 0.0–7.0)
Eosinophils Absolute: 0 10*3/uL (ref 0.0–0.5)
HCT: 34.5 % — ABNORMAL LOW (ref 34.8–46.6)
HEMOGLOBIN: 11 g/dL — AB (ref 11.6–15.9)
LYMPH#: 0.6 10*3/uL — AB (ref 0.9–3.3)
LYMPH%: 30.6 % (ref 14.0–49.7)
MCH: 29.4 pg (ref 25.1–34.0)
MCHC: 31.8 g/dL (ref 31.5–36.0)
MCV: 92.4 fL (ref 79.5–101.0)
MONO#: 0.3 10*3/uL (ref 0.1–0.9)
MONO%: 15.3 % — AB (ref 0.0–14.0)
NEUT#: 1 10*3/uL — ABNORMAL LOW (ref 1.5–6.5)
NEUT%: 51.6 % (ref 38.4–76.8)
PLATELETS: 182 10*3/uL (ref 145–400)
RBC: 3.74 10*6/uL (ref 3.70–5.45)
RDW: 16.3 % — ABNORMAL HIGH (ref 11.2–14.5)
WBC: 1.9 10*3/uL — ABNORMAL LOW (ref 3.9–10.3)

## 2014-10-27 MED ORDER — PEGFILGRASTIM 6 MG/0.6ML ~~LOC~~ PSKT
6.0000 mg | PREFILLED_SYRINGE | Freq: Once | SUBCUTANEOUS | Status: AC
  Administered 2014-10-27: 6 mg via SUBCUTANEOUS
  Filled 2014-10-27: qty 0.6

## 2014-10-27 MED ORDER — PACLITAXEL PROTEIN-BOUND CHEMO INJECTION 100 MG
85.0000 mg/m2 | Freq: Once | INTRAVENOUS | Status: AC
  Administered 2014-10-27: 150 mg via INTRAVENOUS
  Filled 2014-10-27: qty 30

## 2014-10-27 MED ORDER — SODIUM CHLORIDE 0.9 % IJ SOLN
10.0000 mL | INTRAMUSCULAR | Status: DC | PRN
  Administered 2014-10-27: 10 mL
  Filled 2014-10-27: qty 10

## 2014-10-27 MED ORDER — ENOXAPARIN SODIUM 100 MG/ML ~~LOC~~ SOLN: 100.0000 mg | SUBCUTANEOUS | Status: DC

## 2014-10-27 MED ORDER — HEPARIN SOD (PORK) LOCK FLUSH 100 UNIT/ML IV SOLN
500.0000 [IU] | Freq: Once | INTRAVENOUS | Status: AC | PRN
  Administered 2014-10-27: 500 [IU]
  Filled 2014-10-27: qty 5

## 2014-10-27 MED ORDER — SODIUM CHLORIDE 0.9 % IV SOLN
Freq: Once | INTRAVENOUS | Status: AC
  Administered 2014-10-27: 12:00:00 via INTRAVENOUS

## 2014-10-27 MED ORDER — SODIUM CHLORIDE 0.9 % IV SOLN
Freq: Once | INTRAVENOUS | Status: AC
  Administered 2014-10-27: 12:00:00 via INTRAVENOUS
  Filled 2014-10-27: qty 4

## 2014-10-27 NOTE — Addendum Note (Signed)
Addended by: Prentiss Bells on: 10/27/2014 06:54 PM   Modules accepted: Medications

## 2014-10-27 NOTE — Patient Instructions (Signed)
[facility] Discharge Instructions for Patients Receiving Chemotherapy  Today you received the following chemotherapy agents Abraxane.   To help prevent nausea and vomiting after your treatment, we encourage you to take your nausea medication as directed.  Zofran : Take 1 tablet (8 mg total) by mouth 2 (two) times daily. Start the day after chemo for 2 days.  Compazine: Take 1 tablet (10 mg total) by mouth every 6 (six) hours as needed  BELOW ARE SYMPTOMS THAT SHOULD BE REPORTED IMMEDIATELY:  *FEVER GREATER THAN 100.5 F  *CHILLS WITH OR WITHOUT FEVER  NAUSEA AND VOMITING THAT IS NOT CONTROLLED WITH YOUR NAUSEA MEDICATION  *UNUSUAL SHORTNESS OF BREATH  *UNUSUAL BRUISING OR BLEEDING  TENDERNESS IN MOUTH AND THROAT WITH OR WITHOUT PRESENCE OF ULCERS  *URINARY PROBLEMS  *BOWEL PROBLEMS  UNUSUAL RASH Items with * indicate a potential emergency and should be followed up as soon as possible.  Feel free to call the clinic you have any questions or concerns. The clinic phone number is [phone].  Please show the [facility] at check-in to the Emergency Department and triage nurse.   Nanoparticle Albumin-Bound Paclitaxel injection What is this medicine? NANOPARTICLE ALBUMIN-BOUND PACLITAXEL (Na no PAHR ti kuhl al BYOO muhn-bound PAK li TAX el) is a chemotherapy drug. It targets fast dividing cells, like cancer cells, and causes these cells to die. This medicine is used to treat advanced breast cancer and advanced lung cancer. This medicine may be used for other purposes; ask your health care provider or pharmacist if you have questions. COMMON BRAND NAME(S): Abraxane What should I tell my health care provider before I take this medicine? They need to know if you have any of these conditions: -kidney disease -liver disease -low blood counts, like low platelets, red blood cells, or white blood cells -recent or ongoing radiation therapy -an unusual or allergic  reaction to paclitaxel, albumin, other chemotherapy, other medicines, foods, dyes, or preservatives -pregnant or trying to get pregnant -breast-feeding How should I use this medicine? This drug is given as an infusion into a vein. It is administered in a hospital or clinic by a specially trained health care professional. Talk to your pediatrician regarding the use of this medicine in children. Special care may be needed. Overdosage: If you think you have taken too much of this medicine contact a poison control center or emergency room at once. NOTE: This medicine is only for you. Do not share this medicine with others. What if I miss a dose? It is important not to miss your dose. Call your doctor or health care professional if you are unable to keep an appointment. What may interact with this medicine? -cyclosporine -diazepam -ketoconazole -medicines to increase blood counts like filgrastim, pegfilgrastim, sargramostim -other chemotherapy drugs like cisplatin, doxorubicin, epirubicin, etoposide, teniposide, vincristine -quinidine -testosterone -vaccines -verapamil Talk to your doctor or health care professional before taking any of these medicines: -acetaminophen -aspirin -ibuprofen -ketoprofen -naproxen This list may not describe all possible interactions. Give your health care provider a list of all the medicines, herbs, non-prescription drugs, or dietary supplements you use. Also tell them if you smoke, drink alcohol, or use illegal drugs. Some items may interact with your medicine. What should I watch for while using this medicine? Your condition will be monitored carefully while you are receiving this medicine. You will need important blood work done while you are taking this medicine. This drug may make you feel generally unwell. This is not uncommon,  as chemotherapy can affect healthy cells as well as cancer cells. Report any side effects. Continue your course of treatment even  though you feel ill unless your doctor tells you to stop. In some cases, you may be given additional medicines to help with side effects. Follow all directions for their use. Call your doctor or health care professional for advice if you get a fever, chills or sore throat, or other symptoms of a cold or flu. Do not treat yourself. This drug decreases your body's ability to fight infections. Try to avoid being around people who are sick. This medicine may increase your risk to bruise or bleed. Call your doctor or health care professional if you notice any unusual bleeding. Be careful brushing and flossing your teeth or using a toothpick because you may get an infection or bleed more easily. If you have any dental work done, tell your dentist you are receiving this medicine. Avoid taking products that contain aspirin, acetaminophen, ibuprofen, naproxen, or ketoprofen unless instructed by your doctor. These medicines may hide a fever. Do not become pregnant while taking this medicine. Women should inform their doctor if they wish to become pregnant or think they might be pregnant. There is a potential for serious side effects to an unborn child. Talk to your health care professional or pharmacist for more information. Do not breast-feed an infant while taking this medicine. Men are advised not to father a child while receiving this medicine. What side effects may I notice from receiving this medicine? Side effects that you should report to your doctor or health care professional as soon as possible: -allergic reactions like skin rash, itching or hives, swelling of the face, lips, or tongue -low blood counts - This drug may decrease the number of white blood cells, red blood cells and platelets. You may be at increased risk for infections and bleeding. -signs of infection - fever or chills, cough, sore throat, pain or difficulty passing urine -signs of decreased platelets or bleeding - bruising, pinpoint  red spots on the skin, black, tarry stools, nosebleeds -signs of decreased red blood cells - unusually weak or tired, fainting spells, lightheadedness -breathing problems -changes in vision -chest pain -high or low blood pressure -mouth sores -nausea and vomiting -pain, swelling, redness or irritation at the injection site -pain, tingling, numbness in the hands or feet -slow or irregular heartbeat -swelling of the ankle, feet, hands Side effects that usually do not require medical attention (report to your doctor or health care professional if they continue or are bothersome): -aches, pains -changes in the color of fingernails -diarrhea -hair loss -loss of appetite This list may not describe all possible side effects. Call your doctor for medical advice about side effects. You may report side effects to FDA at 1-800-FDA-1088. Where should I keep my medicine? This drug is given in a hospital or clinic and will not be stored at home. NOTE: This sheet is a summary. It may not cover all possible information. If you have questions about this medicine, talk to your doctor, pharmacist, or health care provider.  2015, [employer]. (2012-06-21 16:48:50)

## 2014-10-27 NOTE — Telephone Encounter (Signed)
Gave avs & calendar for June thru July.

## 2014-10-27 NOTE — Progress Notes (Signed)
ID: [name]   DOB: Mar 14, 1956  MR#: [id number]  ZHG#:[id number]  PCP: [name], NP [name]:  [facility]: [name] OTHER HD:[id number] [name], [name], [name]  CHIEF COMPLAINT:  Estrogen receptor positive Breast Cancer; Ovarian cancer; DVT  CURRENT TREATMENT: Letrozole; abraxane; lovenox   BREAST CANCER HISTORY: From the original consult note:  [name] noted a mass in her right breast mid December 2013, and as it did not spontaneously resolve over a couple of weeks she brought it to her primary physician's attention. She was set up for diagnostic mammography and right breast ultrasonography at the breast Center 05/10/2012. (Note that the patient's most recent prior mammography had been in October 2008). The current study showed a spiculated mass in the superior subareolar portion of the right breast measuring approximately 5 cm and associated with pleomorphic calcifications. This was firm and palpable. There was right nipple retraction and skin thickening. Ultrasound confirmed an irregularly marginated hypoechoic mass measuring 3.5 cm by ultrasound, and an abnormal appearing lower right axillary lymph node measuring 2.6 cm.  Biopsies of both the breast mass and the abnormal appearing lymph node were performed 05/21/2012. Both showed an invasive ductal carcinoma, grade 2, with similar prognostic panels (the breast mass was 100% estrogen and 73% progesterone receptor positive, with an MIB-1 of 5%; the lymph node was 100% estrogen 100% progesterone receptor positive, with an MIB-1 of 20%). Both masses were HER-2 negative.  Breast MRI obtained at [facility] imaging 05/29/2012 confirmed a dominant mass in the retroareolar right breast measuring 4.4 cm maximally. There was a satellite nodule inferior and lateral to this mass, measuring 1.7 cm. There were no other masses in either breast. Aside from the previously biopsied lymph node there were other mildly  enhancing level I right axillary lymph nodes which did not appear pathologic. There was no other lymphadenopathy noted.   The patient's subsequent history is as detailed below.  OVARIAN CANCER HISTORY: From the 06/16/2014 summary note:  "[name] returns today for review of her restaging studies accompanied by her husband [name]. To summarize her recent history: She was admitted to [facility] with a diagnosis of possible pneumonia in late December 2015. Chest x-ray showed some increased density in the right upper lobe. CT scan was suggested and was obtained by Dr. Inda Castle. This showed 2 small nodules in the right middle lobe, measuring 8 and 4 mm respectively. Dr. Inda Castle then contacted [name] for further evaluation and we set [place] up for a PET scan and which was performed late January. This showed the 2 nodules in the lung not to be hypermetabolic. This of course does not prove that they are not malignant. More importantly, there was a 13.7 cm cystic/solid mass in the upper pelvis associated with adenopathy,. All of this was hypermetabolic."  The patient was evaluated by gynecologic oncology and on 06/27/2014 she underwent surgery under Dr. Denman George. This consisted of an exploratory laparotomy with hysterectomy and abdominal salpingo-oophorectomy, as well as tumor debulking and omentectomy. 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 The tumor measured 11.5 cm, focally involve the ovarian capsule on the left; the uterus and right ovary and fallopian tube were unremarkable. One para-aortic lymph node was positive out of a total of 6 lymph nodes sampled (2 left common iliac, 2 left para-aortic, and to within the omental resection).  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 She had  her first dose of Abraxane 2 weeks ago, and tolerated that well. She had essentially no nausea and took no nausea medications. She had no mouth sores or taste alteration. In fact she is "eating me out of house and home" according to [facility]. She has gained a little weight. She is doing well with the Lovenox, with no bleeding or bruising problems. Repeat Dopplers showed the right groin clot still to be mobile, so we went ahead and had an IVC filter placed yesterday. She did well with that. She is ready for her second dose of Abraxane today  REVIEW OF SYSTEMS: She still feels somewhat anxious and depressed, but that is improved. She is doing her housework, but no other exercise at present. She has mild problems with diarrhea sometimes and constipation other times, but they know to deal with that. Overall a detailed review of systems today was stable  PAST MEDICAL HISTORY: Past Medical History  Diagnosis Date  . Depression   . DVT (deep venous thrombosis)     hx of on HRT left leg ~2006  . GERD (gastroesophageal reflux disease)   . Hyperlipidemia   . Hypertension   . Hypothyroidism   . PPD positive, treated     rx inh   . Diabetes mellitus   . 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 DJD (degenerative joint disease) of lumbar spine   . Chronic combined systolic and diastolic CHF (congestive heart failure)     a. 08/2012 Echo: EF 55-60%, no rwma, mod MR;  b. 07/2014 Echo: EF 45-50%, distal antsept HK, mod TR/MR, mildly bil-atrial enlargement.  . Paroxysmal atrial fibrillation     a. CHA2DS2VASc = 4-->coumadin.  [name] Physical deconditioning   . Allergy   . Anemia     low iron hx  . Cataract     removed ou  . OSA on CPAP     cpap 4.5 setting  . Breast cancer 2014    a. 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 Clear Cell Ovarian Cancer     a. 06/2014 s/p TAH/BSO  debulking/lymph node dissection;  b. Now on chemo.    PAST SURGICAL HISTORY: Past Surgical History  Procedure Laterality Date  . Tubal ligation    . Cholecystectomy    . Foot surgery    . Eye surgery    . Cataract extraction    . Percutaneous liver biopsy    . Breast biopsy      left breast  . Mastectomy modified radical  06/08/2012    Procedure: MASTECTOMY MODIFIED RADICAL;  Surgeon: Rhonda Jolly, MD;  Location: [place];  Service: General;  Laterality: Right;  . Portacath placement  06/08/2012    Procedure: INSERTION PORT-A-CATH;  Surgeon: Rhonda Jolly, MD;  Location: [place];  Service: General;  Laterality: Left;  . Peg tube placement      peg removed 2015  . History of chemotherapy x 2 treatments, radiation tx  2014  . Pac removed  2015  . Laparotomy N/A 06/27/2014    Procedure: EXPLORATORY LAPAROTOMY;  Surgeon: Everitt Amber, MD;  Location: [facility];  Service: Gynecology;  Laterality: N/A;  . Abdominal hysterectomy N/A 06/27/2014    Procedure: HYSTERECTOMY ABDOMINAL TOTAL;  Surgeon: Everitt Amber, MD;  Location: [facility];  Service: Gynecology;  Laterality: N/A;  . Salpingoophorectomy Bilateral 06/27/2014    Procedure: BILATERAL SALPINGO OOPHORECTOMY/TUMOR DEBULKING/LYMPHNODE DISSECTION, OMENTECTOMY;  Surgeon: Everitt Amber, MD;  Location: [facility];  Service: Gynecology;  Laterality: Bilateral;    FAMILY HISTORY Family History  Problem Relation Age of Onset  . Diabetes Mother   . Hypertension Mother   . Arthritis Mother   . Heart disease Mother   . Heart failure Mother   . Other Mother     benign breast mass  . Stroke Father   . Heart disease Father   . Diabetes Paternal Grandmother   . Colon cancer Paternal Grandfather    the patient's father died in his 40s with a history of dementia. He had had prior strokes. The patient's mother died in her 45s, with a history of congestive heart failure. [name] had no brothers, one sister. There is no history of breast or ovarian cancer in  the family.  GYNECOLOGIC HISTORY: Menarche age 62, first live birth age 63, she is GX P2, menopause approximately 15 years ago, on hormone replacement until 2010.  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 Her husband [name] works for [employer]. Daughter Paul Half is a Physiological scientist and lives in [place]. Daughter Santiago Bur and her family (husband and 2 children aged 31 and 3 years) currently live with the patient. Sammie is a member of a [employer].   ADVANCED DIRECTIVES: Not in place  HEALTH MAINTENANCE: (Updated October 2014) History  Substance Use Topics  . Smoking status: Former Smoker -- 1.00 packs/day for 1 years  . Smokeless tobacco: Never Used  . Alcohol Use: No     Colonoscopy: Never  PAP: Does not recall  Bone density: Never  Lipid panel:   Allergies  Allergen Reactions  . Olmesartan Medoxomil Cough    REACTION: ? if cough  . Venlafaxine Other (See Comments)    REACTION: severe dry mouth  . Adhesive [Tape] Rash  . Tetracycline Hcl Other (See Comments)    Unknown reaction, too long for patient to remember     Current Outpatient Prescriptions  Medication Sig Dispense Refill  . ALPRAZolam (XANAX XR) 1 MG 24 hr tablet Take 1 tablet (1 mg total) by mouth 2 (two) times daily. 240 tablet 0  . buPROPion (WELLBUTRIN XL) 150 MG 24 hr tablet Take 1 tablet (150 mg total) by mouth daily. 30 tablet 1  . clonazePAM (KLONOPIN) 0.5 MG tablet TAKE 1/2 TABLET TWICE DAILY AS NEEDED 30 tablet 0  . docusate sodium (COLACE) 100 MG capsule Take 1 capsule (100 mg total) by mouth 2 (two) times daily. 20 capsule 0  . enoxaparin (LOVENOX) 100 MG/ML injection Inject 1 mL (100 mg total) into the skin daily. 90 Syringe 2  . escitalopram (LEXAPRO) 20 MG tablet Take 20 mg by mouth daily.    . famotidine (PEPCID) 20 MG tablet Take 1 tablet (20 mg total) by mouth  daily. (Patient not taking: Reported on 10/06/2014) 30 tablet 0  . furosemide (LASIX) 20 MG tablet Take 1 tablet (20 mg total) by mouth daily. 90 tablet 1  . letrozole (FEMARA) 2.5 MG tablet Take 1 tablet (2.5 mg total) by mouth daily. 90 tablet 0  . levothyroxine (SYNTHROID, LEVOTHROID) 150 MCG tablet Take 1 tablet (150 mcg total) by mouth daily. 90 tablet 1  . lidocaine-prilocaine (EMLA) cream Apply over  port area 1-2 hours before chemotherapy 30 g 0  . metFORMIN (GLUCOPHAGE) 500 MG tablet Take 500 mg by mouth 2 (two) times daily with a meal.    . metoprolol (LOPRESSOR) 50 MG tablet Take 1 tablet (50 mg total) by mouth 2 (two) times daily. 180 tablet 1  . Nutritional Supplements (ENSURE HIGH PROTEIN PO) Take 237 mLs by mouth 2 (two) times daily.    [name] OLANZapine (ZYPREXA) 2.5 MG tablet Take 2.5 mg by mouth at bedtime.    . ondansetron (ZOFRAN) 8 MG tablet Take 1 tablet (8 mg total) by mouth 2 (two) times daily. Start the day after chemo for 2 days. Then take as needed for nausea or vomiting. 30 tablet 1  . polyethylene glycol (MIRALAX / GLYCOLAX) packet Take 17 g by mouth daily. (Patient not taking: Reported on 10/06/2014) 14 each 0  . potassium chloride (K-DUR) 10 MEQ tablet Take 10 mEq by mouth 2 (two) times daily.  3  . prochlorperazine (COMPAZINE) 10 MG tablet Take 1 tablet (10 mg total) by mouth every 6 (six) hours as needed (Nausea or vomiting). (Patient not taking: Reported on 10/06/2014) 30 tablet 1   No current facility-administered medications for this visit.    OBJECTIVE: Middle-aged white woman who appears stated age 78 Vitals:   10/27/14 0949  BP: 120/58  Pulse: 73  Temp: 97.8 F (36.6 C)  Resp: 18     Body mass index is 27.07 kg/(m^2).    ECOG FS: 1 Filed Weights   10/27/14 0949  Weight: 162 lb 11.2 oz (73.8 kg)   Sclerae unicteric, pupils round and equal Oropharynx clear and moist-- no thrush or other lesions No cervical or supraclavicular adenopathy Lungs no rales or  rhonchi Heart regular rate and rhythm Abd soft, nontender, positive bowel sounds, no masses palpated, no fluid wave MSK no focal spinal tenderness, no upper extremity lymphedema Neuro: nonfocal, well oriented, appropriate affect Breasts: Deferred    LAB RESULTS: Results for CHANELLE, HODSDON (MRN [id number]) as of 10/27/2014 08:46  Ref. Range 06/16/2014 12:23 06/16/2014 12:23 08/11/2014 09:50 09/08/2014 12:36 10/06/2014 07:53  CA 125 Latest Ref Range: <35 U/mL 71 (H) 52.5 (H) 544 (H) 213 (H) 113 (H)   Lab Results  Component Value Date   WBC 1.9* 10/27/2014   NEUTROABS 1.0* 10/27/2014   HGB 11.0* 10/27/2014   HCT 34.5* 10/27/2014   MCV 92.4 10/27/2014   PLT 182 10/27/2014      Chemistry      Component Value Date/Time   NA 139 10/27/2014 0916   NA 135 10/02/2014 0415   K 3.8 10/27/2014 0916   K 3.8 10/02/2014 0415   CL 106 10/02/2014 0415   CL 104 10/29/2012 1058   CO2 25 10/27/2014 0916   CO2 20* 10/02/2014 0415   BUN 6.1* 10/27/2014 0916   BUN 6 10/02/2014 0415   CREATININE 0.6 10/27/2014 0916   CREATININE 0.45 10/02/2014 0415   CREATININE 0.69 11/10/2013 0828      Component Value Date/Time   CALCIUM 8.5 10/27/2014 0916   CALCIUM 8.4* 10/02/2014 0415   ALKPHOS 173* 10/27/2014 0916   ALKPHOS 152* 08/18/2014 1320   AST 42* 10/27/2014 0916   AST 40* 08/18/2014 1320   ALT 22 10/27/2014 0916   ALT 20 08/18/2014 1320   BILITOT 0.58 10/27/2014 0916   BILITOT 0.8 08/18/2014 1320      STUDIES: Ct Head Wo Contrast  09/28/2014   CLINICAL DATA:  Extensive deep venous thrombosis bilateral  legs. History ovarian and breast cancer.  EXAM: CT HEAD WITHOUT CONTRAST  TECHNIQUE: Contiguous axial images were obtained from the base of the skull through the vertex without intravenous contrast.  COMPARISON:  CT head 09/08/2012  FINDINGS: Moderate atrophy has progressed since the prior study. Ventricle size remains within normal limits.  Negative for acute infarct. Negative for hemorrhage or mass  lesion. No suggestion of metastatic disease on unenhanced imaging  Atherosclerotic calcification  No skull lesion.  IMPRESSION: Progressive atrophy since 2014.  No acute abnormality.   Electronically Signed   By: Franchot Gallo M.D.   On: 09/28/2014 16:58   Ir Veno/ext/uni Left  09/30/2014   CLINICAL DATA:  59 year old female with bilateral lower extremity DVT and history of ovarian cancer. The patient is currently anticoagulated but continues to have pain in the left calf. Plan for left lower extremity venogram and possible thrombolysis and mechanical thrombectomy.  EXAM: LEFT LOWER EXTREMITY VENOGRAPHY; MECHANICAL VENOUS THROMBECTOMY ; ULTRASOUND GUIDANCE FOR VASCULAR ACCESS  Physician: Stephan Minister. Henn, MD  FLUOROSCOPY TIME:  15 minutes and 24 seconds, 354 mGy  MEDICATIONS: 1.5 mg Versed, 75 mcg fentanyl. A radiology nurse monitored the patient for moderate sedation.  ANESTHESIA/SEDATION: Moderate sedation time: 120 minutes  PROCEDURE: The procedure was explained to the patient. Venography, thrombolysis, mechanical thrombectomy and IVC filter placement were discussed with the patient. The risks and benefits of the procedure were discussed and the patient's questions were addressed. Informed consent was obtained from the patient.  The patient has lower extremities were evaluated with ultrasound. Patient's primary symptom is pain and swelling in the left calf. Thrombus in the left popliteal vein was identified. Intended to treat the left popliteal thrombus via a posterior tibial vein. As a result, the left ankle and left popliteal region were prepped and draped in sterile fashion. Maximal barrier sterile technique was utilized including caps, mask, sterile gowns, sterile gloves, sterile drape, hand hygiene and skin antiseptic. Patient was in a prone position.  Ultrasound demonstrated small thrombosed posterior tibial veins at the ankle. This area was anesthetized with 1% lidocaine. Multiple attempts were made to  cannulate these thrombosed posterior tibial veins with ultrasound guidance. A wire could not be successfully advanced up the calf. As a result, attention was directed to the left popliteal vein. The left popliteal vein was identified with ultrasound. Popliteal fossa was anesthetized with 1% lidocaine. 21 gauge needle was directed into the occluded popliteal vein with ultrasound guidance and a micropuncture dilator set was placed. Eventually, 6 French vascular sheath was placed and a 5 French catheter was advanced up the left leg. Venography demonstrated thrombus in the left thigh. Catheter was advanced into the left iliac veins and iliac/IVC venography was performed.  Attention was directed to performing mechanical thrombectomy of the thrombus in the left common femoral vein and left femoral vein. 6 [place] vascular sheath was exchanged for an 8 [place] vascular sheath. The Angiojet device and an 8 [place] Mach 1 Guide Catheter were advanced over the wire position in the left common femoral vein. Mechanical thrombectomy was performed using the Rapid Lysis technique. 25 mg TPA was combined with 1 liter of normal saline for the mechanical thrombectomy. Approximately 375 mL of fluid was used for this procedure. The Angiojet and Guide Catheter were pulled down to the upper popliteal vein region while spinning the Guide Catheter. Follow-up venography was performed. Residual areas of thrombus in the left femoral vein and the left common femoral vein were identified. These areas were  treated again with the Angiojet device. Follow-up venography demonstrated patency of the left common femoral vein and left femoral vein with a small amount residual clot. The vascular sheath was removed with manual compression.  FINDINGS: Right lower extremity: Ultrasound demonstrated thrombus in the right femoral vein. Minimal clot identified in the right common femoral vein.  Left lower extremity: Thrombus in the left popliteal vein and left  posterior tibial veins identified with ultrasound. Unable to successfully cannulate a thrombosed posterior tibial veins with ultrasound. The left popliteal vein was successfully cannulated. Venography demonstrated clot in left common femoral vein and left femoral vein. Left iliac veins and IVC are patent. Following mechanical thrombectomy, the left common femoral vein and left femoral vein were patent with small amount of nonocclusive thrombus. The left iliac veins and IVC were also patent at the end of the procedure.  Estimated blood loss: Minimal  COMPLICATIONS: None  IMPRESSION: Successful mechanical thrombectomy of the deep vein thrombus in the left femoral vein and left common femoral vein. These vessels were patent at the end of the procedure with a small amount of nonocclusive thrombus.  The left popliteal vein thrombus was not treated with mechanical thrombectomy because of difficulty accessing the posterior tibial veins. If the left calf swelling does not improve, we could try to access the posterior tibial veins again.  History of mobile clot in the right common femoral vein. No significant thrombus was identified in the right common femoral vein today. Recommend repeating a venous duplex of this area to confirm this finding.   Electronically Signed   By: Markus Daft M.D.   On: 09/30/2014 19:46   Ir Ivc Filter Plmt / S&i /img Guid/mod Sed  10/26/2014   INDICATION: 59 year-old with history of breast cancer and bilateral lower extremity DVT. Recent ultrasound demonstrated mobile thrombus in the right groin. Retrievable IVC filter Rhonda be placed for pulmonary embolism prophylaxis.  EXAM: IVC FILTER PLACEMENT; IVC VENOGRAM; ULTRASOUND FOR VASCULAR ACCESS  Physician: Stephan Minister. Henn, MD  FLUOROSCOPY TIME:  3 minutes and 6 seconds, 101 mGy.  MEDICATIONS: 1 mg Versed, 50 mcg fentanyl.  ANESTHESIA/SEDATION: Moderate sedation time: 24 minutes  CONTRAST:  55 mL Omnipaque 300  PROCEDURE: The procedure was explained to  the patient. The risks and benefits of the procedure were discussed and the patient's questions were addressed. Informed consent was obtained from the patient. Ultrasound demonstrated a patent right internal jugular vein. Ultrasound images were obtained for documentation. The right side of the neck was prepped and draped in a sterile fashion. Maximal barrier sterile technique was utilized including caps, mask, sterile gowns, sterile gloves, sterile drape, hand hygiene and skin antiseptic. The skin was anesthetized with 1% lidocaine. A 21 gauge needle was directed into the vein with ultrasound guidance and a micropuncture dilator set was placed. A wire was advanced into the IVC. The filter sheath was advanced over the wire into the IVC. An IVC venogram was performed. Fluoroscopic images were obtained for documentation. A Bard Denali filter was deployed below the renal veins. A follow-up venogram was performed and the vascular sheath was removed with manual compression.  FINDINGS: IVC was patent. Bilateral renal veins were identified. The IVC filter was deployed in the infrarenal IVC. The IVC just below the renal veins was felt to be slightly large for size. The IVC approximately 1 vertebral body below the renal veins was slightly smaller, measuring roughly 27 mm. This area was felt to be adequate for IVC filter placement. IVC  filter was successfully placed in the infrarenal IVC and well positioned on the follow-up images.  Estimated blood loss:  Minimal  COMPLICATIONS: None  IMPRESSION: Successful placement of a retrievable IVC filter.  This IVC filter is potentially retrievable. The patient Rhonda be assessed for filter retrieval by Interventional Radiology in approximately 3 months. Further recommendations regarding filter retrieval, continued surveillance or declaration of device permanence, Rhonda be made at that time.   Electronically Signed   By: Markus Daft M.D.   On: 10/26/2014 17:24   US Venous Img Lower  Bilateral  10/25/2014   CLINICAL DATA:  59 year old with bilateral lower extremity DVT. Status post mechanical thrombectomy to the deep vein thrombosis in the left common femoral vein and left femoral vein. Patient presents for follow-up.  EXAM: BILATERAL LOWER EXTREMITY VENOUS DOPPLER ULTRASOUND  TECHNIQUE: Gray-scale sonography with graded compression, as well as color Doppler and duplex ultrasound were performed to evaluate the lower extremity deep venous systems from the level of the common femoral vein and including the common femoral, femoral, profunda femoral, popliteal and calf veins including the posterior tibial, peroneal and gastrocnemius veins when visible. The superficial great saphenous vein was also interrogated. Spectral Doppler was utilized to evaluate flow at rest and with distal augmentation maneuvers in the common femoral, femoral and popliteal veins.  COMPARISON:  Venous duplex report from [facility] dated 10/01/2014. Images not available.  FINDINGS: RIGHT LOWER EXTREMITY  The proximal right common femoral vein is compressible and patent. There is nonocclusive thrombus in the distal right common femoral vein at the bifurcation of the femoral vein and deep femoral vein. This nonocclusive thrombus is mildly mobile and likely reflects the previously described mobile clot. There is no significant compressibility in the right femoral vein. There is at least partial compressibility of the right posterior tibial veins. The right saphenofemoral junction is patent. Color Doppler flow in the right profunda femoral vein. Small amount of blood flow in the proximal right femoral vein. There is nonocclusive thrombus in the right popliteal vein. Right great saphenous vein is compressible and patent.  LEFT LOWER EXTREMITY  The proximal left common femoral vein is compressible. Nonocclusive thrombus in the left common femoral vein at the level of the saphenofemoral junction. There is thrombus extending  into the proximal left great saphenous vein. The left femoral vein is non compressible. Left popliteal vein is non compressible. There appears to be at least partial compressibility of the left posterior tibial veins. The proximal left common femoral vein has color Doppler flow. There appears to be nonocclusive thrombus in the proximal left profunda femoral vein. Minimal flow identified in the left femoral vein. Small amount of flow in the left popliteal vein. Although there may be partial compressibility of the left calf veins, there was no significant color Doppler flow. The proximal left great saphenous vein contains thrombus. The great saphenous vein distal to the proximal thigh is compressible and patent.  Other Findings:  None.  IMPRESSION: Persistent bilateral lower extremity deep vein thrombosis. The left lower extremity thrombus extends from the left saphenofemoral junction to the left popliteal vein and difficult to exclude calf thrombus. There is also superficial thrombosis of the proximal left great saphenous vein. The left lower extremity deep vein thrombosis has minimally changed from the previous examination.  Persistent deep vein thrombosis in the right lower extremity extending from the distal right common femoral vein to the right popliteal vein. Difficult to evaluate for right calf thrombus. Again noted is a  nonocclusive and mobile clot in the right common femoral vein region which was previously described on the prior examination.   Electronically Signed   By: Markus Daft M.D.   On: 10/25/2014 10:35   Ir Thrombect Veno Mech Mod Sed  09/30/2014   CLINICAL DATA:  59 year old female with bilateral lower extremity DVT and history of ovarian cancer. The patient is currently anticoagulated but continues to have pain in the left calf. Plan for left lower extremity venogram and possible thrombolysis and mechanical thrombectomy.  EXAM: LEFT LOWER EXTREMITY VENOGRAPHY; MECHANICAL VENOUS THROMBECTOMY ;  ULTRASOUND GUIDANCE FOR VASCULAR ACCESS  Physician: Stephan Minister. Henn, MD  FLUOROSCOPY TIME:  15 minutes and 24 seconds, 354 mGy  MEDICATIONS: 1.5 mg Versed, 75 mcg fentanyl. A radiology nurse monitored the patient for moderate sedation.  ANESTHESIA/SEDATION: Moderate sedation time: 120 minutes  PROCEDURE: The procedure was explained to the patient. Venography, thrombolysis, mechanical thrombectomy and IVC filter placement were discussed with the patient. The risks and benefits of the procedure were discussed and the patient's questions were addressed. Informed consent was obtained from the patient.  The patient has lower extremities were evaluated with ultrasound. Patient's primary symptom is pain and swelling in the left calf. Thrombus in the left popliteal vein was identified. Intended to treat the left popliteal thrombus via a posterior tibial vein. As a result, the left ankle and left popliteal region were prepped and draped in sterile fashion. Maximal barrier sterile technique was utilized including caps, mask, sterile gowns, sterile gloves, sterile drape, hand hygiene and skin antiseptic. Patient was in a prone position.  Ultrasound demonstrated small thrombosed posterior tibial veins at the ankle. This area was anesthetized with 1% lidocaine. Multiple attempts were made to cannulate these thrombosed posterior tibial veins with ultrasound guidance. A wire could not be successfully advanced up the calf. As a result, attention was directed to the left popliteal vein. The left popliteal vein was identified with ultrasound. Popliteal fossa was anesthetized with 1% lidocaine. 21 gauge needle was directed into the occluded popliteal vein with ultrasound guidance and a micropuncture dilator set was placed. Eventually, 6 French vascular sheath was placed and a 5 French catheter was advanced up the left leg. Venography demonstrated thrombus in the left thigh. Catheter was advanced into the left iliac veins and iliac/IVC  venography was performed.  Attention was directed to performing mechanical thrombectomy of the thrombus in the left common femoral vein and left femoral vein. 6 [place] vascular sheath was exchanged for an 8 [place] vascular sheath. The Angiojet device and an 8 [place] Mach 1 Guide Catheter were advanced over the wire position in the left common femoral vein. Mechanical thrombectomy was performed using the Rapid Lysis technique. 25 mg TPA was combined with 1 liter of normal saline for the mechanical thrombectomy. Approximately 375 mL of fluid was used for this procedure. The Angiojet and Guide Catheter were pulled down to the upper popliteal vein region while spinning the Guide Catheter. Follow-up venography was performed. Residual areas of thrombus in the left femoral vein and the left common femoral vein were identified. These areas were treated again with the Angiojet device. Follow-up venography demonstrated patency of the left common femoral vein and left femoral vein with a small amount residual clot. The vascular sheath was removed with manual compression.  FINDINGS: Right lower extremity: Ultrasound demonstrated thrombus in the right femoral vein. Minimal clot identified in the right common femoral vein.  Left lower extremity: Thrombus in the left popliteal vein and  left posterior tibial veins identified with ultrasound. Unable to successfully cannulate a thrombosed posterior tibial veins with ultrasound. The left popliteal vein was successfully cannulated. Venography demonstrated clot in left common femoral vein and left femoral vein. Left iliac veins and IVC are patent. Following mechanical thrombectomy, the left common femoral vein and left femoral vein were patent with small amount of nonocclusive thrombus. The left iliac veins and IVC were also patent at the end of the procedure.  Estimated blood loss: Minimal  COMPLICATIONS: None  IMPRESSION: Successful mechanical thrombectomy of the deep vein thrombus in  the left femoral vein and left common femoral vein. These vessels were patent at the end of the procedure with a small amount of nonocclusive thrombus.  The left popliteal vein thrombus was not treated with mechanical thrombectomy because of difficulty accessing the posterior tibial veins. If the left calf swelling does not improve, we could try to access the posterior tibial veins again.  History of mobile clot in the right common femoral vein. No significant thrombus was identified in the right common femoral vein today. Recommend repeating a venous duplex of this area to confirm this finding.   Electronically Signed   By: Markus Daft M.D.   On: 09/30/2014 19:46   Ir US Guide Vasc Access Left  09/30/2014   CLINICAL DATA:  59 year old female with bilateral lower extremity DVT and history of ovarian cancer. The patient is currently anticoagulated but continues to have pain in the left calf. Plan for left lower extremity venogram and possible thrombolysis and mechanical thrombectomy.  EXAM: LEFT LOWER EXTREMITY VENOGRAPHY; MECHANICAL VENOUS THROMBECTOMY ; ULTRASOUND GUIDANCE FOR VASCULAR ACCESS  Physician: Stephan Minister. Henn, MD  FLUOROSCOPY TIME:  15 minutes and 24 seconds, 354 mGy  MEDICATIONS: 1.5 mg Versed, 75 mcg fentanyl. A radiology nurse monitored the patient for moderate sedation.  ANESTHESIA/SEDATION: Moderate sedation time: 120 minutes  PROCEDURE: The procedure was explained to the patient. Venography, thrombolysis, mechanical thrombectomy and IVC filter placement were discussed with the patient. The risks and benefits of the procedure were discussed and the patient's questions were addressed. Informed consent was obtained from the patient.  The patient has lower extremities were evaluated with ultrasound. Patient's primary symptom is pain and swelling in the left calf. Thrombus in the left popliteal vein was identified. Intended to treat the left popliteal thrombus via a posterior tibial vein. As a result,  the left ankle and left popliteal region were prepped and draped in sterile fashion. Maximal barrier sterile technique was utilized including caps, mask, sterile gowns, sterile gloves, sterile drape, hand hygiene and skin antiseptic. Patient was in a prone position.  Ultrasound demonstrated small thrombosed posterior tibial veins at the ankle. This area was anesthetized with 1% lidocaine. Multiple attempts were made to cannulate these thrombosed posterior tibial veins with ultrasound guidance. A wire could not be successfully advanced up the calf. As a result, attention was directed to the left popliteal vein. The left popliteal vein was identified with ultrasound. Popliteal fossa was anesthetized with 1% lidocaine. 21 gauge needle was directed into the occluded popliteal vein with ultrasound guidance and a micropuncture dilator set was placed. Eventually, 6 French vascular sheath was placed and a 5 French catheter was advanced up the left leg. Venography demonstrated thrombus in the left thigh. Catheter was advanced into the left iliac veins and iliac/IVC venography was performed.  Attention was directed to performing mechanical thrombectomy of the thrombus in the left common femoral vein and left femoral vein. 6  [place] vascular sheath was exchanged for an 8 [place] vascular sheath. The Angiojet device and an 8 [place] Mach 1 Guide Catheter were advanced over the wire position in the left common femoral vein. Mechanical thrombectomy was performed using the Rapid Lysis technique. 25 mg TPA was combined with 1 liter of normal saline for the mechanical thrombectomy. Approximately 375 mL of fluid was used for this procedure. The Angiojet and Guide Catheter were pulled down to the upper popliteal vein region while spinning the Guide Catheter. Follow-up venography was performed. Residual areas of thrombus in the left femoral vein and the left common femoral vein were identified. These areas were treated again with the  Angiojet device. Follow-up venography demonstrated patency of the left common femoral vein and left femoral vein with a small amount residual clot. The vascular sheath was removed with manual compression.  FINDINGS: Right lower extremity: Ultrasound demonstrated thrombus in the right femoral vein. Minimal clot identified in the right common femoral vein.  Left lower extremity: Thrombus in the left popliteal vein and left posterior tibial veins identified with ultrasound. Unable to successfully cannulate a thrombosed posterior tibial veins with ultrasound. The left popliteal vein was successfully cannulated. Venography demonstrated clot in left common femoral vein and left femoral vein. Left iliac veins and IVC are patent. Following mechanical thrombectomy, the left common femoral vein and left femoral vein were patent with small amount of nonocclusive thrombus. The left iliac veins and IVC were also patent at the end of the procedure.  Estimated blood loss: Minimal  COMPLICATIONS: None  IMPRESSION: Successful mechanical thrombectomy of the deep vein thrombus in the left femoral vein and left common femoral vein. These vessels were patent at the end of the procedure with a small amount of nonocclusive thrombus.  The left popliteal vein thrombus was not treated with mechanical thrombectomy because of difficulty accessing the posterior tibial veins. If the left calf swelling does not improve, we could try to access the posterior tibial veins again.  History of mobile clot in the right common femoral vein. No significant thrombus was identified in the right common femoral vein today. Recommend repeating a venous duplex of this area to confirm this finding.   Electronically Signed   By: Markus Daft M.D.   On: 09/30/2014 19:46    ASSESSMENT: 59 y.o. [name]   (1)  status post right mastectomy  06/08/2012 for a pT3, pN1a, stage IIIA invasive ductal carcinoma, grade 2,  estrogen and progesterone receptor  positive, HER-2/neu negative, with an MIB-1 of 20%.    (2)  treated in the adjuvant setting with docetaxel/ cyclophosphamide given every 3 weeks. There were multiple and severe complications, and the  patient tolerated only two cycles, last dose 07/29/2012  (3) letrozole started 08/16/2012  (a) osteopenia, with a T score of -1.14 April 2013  (4) postmastectomy radiation completed 12/24/2012  (5)  comorbidities include diabetes, hypertension, chronic liver disease with cirrhosis and fatty liver, and nutritional disturbance with temporarily dependence on PEG feeds (discontinued July 2014).  (6) restaging studies January 2016 show  (a) 8 mm and 4 mm Right middle lobe lung nodules, not metabolically active on PET -- Rhonda require follow-up  (b) hypermetabolic 91.6 cm mixed solid/cystic lesion in upper pelvis, with associated adenopathy  (7) status post exploratory laparotomy 06/27/2014 with total abdominal hysterectomy, bilateral salpingo-oophorectomy, para-aortic lymphadenectomy, omentectomy and radical tumor debulking for a clear cell ovarian cancer, pT1c pN1, stage IIIC .  (8) adjuvant chemotherapy consisting of carboplatin and paclitaxel  given weekly days 1 and 8 of each 21 day cycle, for 6-8 cycles as tolerated, started 07/17/2014, with onpro support  (a) day 8 cycle 2 omitted and further treatment delayed because of an episode of Klebsiella sepsis requiring intensive care unit admission  (9) bilateral lower extremity DVTs documented 09/25/2014 despite being on Coumadin for PAF (subtherapeutic INR) with mobile Right common femoral clot  (a) status post thrombolysis 09/30/2014  (b) on Lovenox daily as of 10/02/2014  (c) IVC filter placed 10/26/2014 as mobile clot still noted Right groin  (10) started abraxane 10/13/2014, to be repeated every 14 days  (11) genetics testing pending   PLAN: Cheyane's counts are borderline today, with an ANC of 1.0. We are adding onpro, but I am also going  to cut her dose of Abraxane by 15%, since her bone marrow is so sensitive.  We are continuing the Lovenox and they requested we change the prescription to [facility] so they can save a little money. I'm glad to do that for her. We plan to remove the IVC filter in 3 months  We Rhonda check Emalia's labwork every Friday. She Rhonda see [name] every 14 days, the day of treatment, to make sure she can proceed to therapy.. After 4-6 cycles, depending on the results of the CEA 125, we Rhonda consider restaging studies.  She has been scheduled to meet with the genetics counselor next week. We should have those results before the end of July.  Shiela knows to call for any problems that may develop for her next visit here, but particularly if any fever or bleeding problems develop.    Chauncey Cruel, MD    10/27/2014

## 2014-10-27 NOTE — Progress Notes (Signed)
OK to treat per Dr. Jana Hakim with low neutrophil count.

## 2014-10-28 LAB — CA 125: CA 125: 123 U/mL — ABNORMAL HIGH (ref <35)

## 2014-10-30 MED ORDER — ENOXAPARIN SODIUM 100 MG/ML ~~LOC~~ SOLN: 100.0000 mg | SUBCUTANEOUS | Status: DC

## 2014-10-31 ENCOUNTER — Telehealth: Payer: Self-pay | Admitting: Family

## 2014-10-31 NOTE — Telephone Encounter (Signed)
rec good hand washing, avoid sharing cups etc with family members. If she develops fever, sore throat will need OV.

## 2014-10-31 NOTE — Telephone Encounter (Signed)
Notified pt's spouse and he voices understanding. 

## 2014-10-31 NOTE — Telephone Encounter (Signed)
Caller name: Avalynne Diver Relationship to patient: spouse Can be reached: [phone]  Reason for call: Pt was exposed to 2 grandchildren who were diagnosed today with strep throat. The grandchildren live with them and they are concerned due to pts cancer. They were advised by the childrens pediatrician to call in as a preventative measure. Pt states that she is feeling good. She is not having sore throat or fever at this time.

## 2014-11-03 ENCOUNTER — Other Ambulatory Visit (HOSPITAL_BASED_OUTPATIENT_CLINIC_OR_DEPARTMENT_OTHER): Payer: BLUE CROSS/BLUE SHIELD

## 2014-11-03 DIAGNOSIS — I4891 Unspecified atrial fibrillation: Secondary | ICD-10-CM

## 2014-11-03 DIAGNOSIS — Z7901 Long term (current) use of anticoagulants: Secondary | ICD-10-CM | POA: Diagnosis not present

## 2014-11-03 DIAGNOSIS — C50911 Malignant neoplasm of unspecified site of right female breast: Secondary | ICD-10-CM | POA: Diagnosis not present

## 2014-11-03 DIAGNOSIS — C569 Malignant neoplasm of unspecified ovary: Secondary | ICD-10-CM | POA: Diagnosis not present

## 2014-11-03 LAB — CBC WITH DIFFERENTIAL/PLATELET
BASO%: 1.4 % (ref 0.0–2.0)
BASOS ABS: 0.1 10*3/uL (ref 0.0–0.1)
EOS ABS: 0.1 10*3/uL (ref 0.0–0.5)
EOS%: 1 % (ref 0.0–7.0)
HCT: 34 % — ABNORMAL LOW (ref 34.8–46.6)
HGB: 11 g/dL — ABNORMAL LOW (ref 11.6–15.9)
LYMPH#: 0.9 10*3/uL (ref 0.9–3.3)
LYMPH%: 16.9 % (ref 14.0–49.7)
MCH: 30 pg (ref 25.1–34.0)
MCHC: 32.4 g/dL (ref 31.5–36.0)
MCV: 92.6 fL (ref 79.5–101.0)
MONO#: 0.8 10*3/uL (ref 0.1–0.9)
MONO%: 16.1 % — ABNORMAL HIGH (ref 0.0–14.0)
NEUT%: 64.6 % (ref 38.4–76.8)
NEUTROS ABS: 3.3 10*3/uL (ref 1.5–6.5)
Platelets: 142 10*3/uL — ABNORMAL LOW (ref 145–400)
RBC: 3.67 10*6/uL — ABNORMAL LOW (ref 3.70–5.45)
RDW: 15.7 % — AB (ref 11.2–14.5)
WBC: 5.1 10*3/uL (ref 3.9–10.3)
nRBC: 2 % — ABNORMAL HIGH (ref 0–0)

## 2014-11-03 LAB — COMPREHENSIVE METABOLIC PANEL (CC13)
ALT: 17 U/L (ref 0–55)
AST: 41 U/L — ABNORMAL HIGH (ref 5–34)
Albumin: 3 g/dL — ABNORMAL LOW (ref 3.5–5.0)
Alkaline Phosphatase: 183 U/L — ABNORMAL HIGH (ref 40–150)
Anion Gap: 6 mEq/L (ref 3–11)
BUN: 5.9 mg/dL — ABNORMAL LOW (ref 7.0–26.0)
CO2: 27 mEq/L (ref 22–29)
CREATININE: 0.6 mg/dL (ref 0.6–1.1)
Calcium: 8.6 mg/dL (ref 8.4–10.4)
Chloride: 107 mEq/L (ref 98–109)
EGFR: >90 mL/min/{1.73_m2} (ref >90)
Glucose: 72 mg/dl (ref 70–140)
Potassium: 4.1 mEq/L (ref 3.5–5.1)
SODIUM: 140 meq/L (ref 136–145)
TOTAL PROTEIN: 6.2 g/dL — AB (ref 6.4–8.3)
Total Bilirubin: 0.58 mg/dL (ref 0.20–1.20)

## 2014-11-03 LAB — PROTIME-INR
INR: 1.2 — ABNORMAL LOW (ref 2.00–3.50)
Protime: 14.4 Seconds — ABNORMAL HIGH (ref 10.6–13.4)

## 2014-11-03 MED ORDER — ENOXAPARIN SODIUM 100 MG/ML ~~LOC~~ SOLN: 100.0000 mg | SUBCUTANEOUS | Status: DC

## 2014-11-03 NOTE — Addendum Note (Signed)
Addended by: Prentiss Bells on: 11/03/2014 12:17 PM   Modules accepted: Orders

## 2014-11-06 ENCOUNTER — Other Ambulatory Visit: Payer: Self-pay | Admitting: Family

## 2014-11-06 ENCOUNTER — Other Ambulatory Visit: Payer: Self-pay

## 2014-11-06 MED ORDER — BUPROPION HCL ER (XL) 150 MG PO TB24: 150.0000 mg | ORAL_TABLET | Freq: Every day | ORAL | Status: DC

## 2014-11-07 ENCOUNTER — Telehealth: Payer: Self-pay | Admitting: Genetic Counselor

## 2014-11-07 NOTE — Telephone Encounter (Signed)
patient husband called to r/s appt to 07/07 @ 11

## 2014-11-08 ENCOUNTER — Encounter: Payer: Self-pay | Admitting: Genetic Counselor

## 2014-11-08 ENCOUNTER — Other Ambulatory Visit: Payer: Self-pay

## 2014-11-10 ENCOUNTER — Ambulatory Visit (HOSPITAL_BASED_OUTPATIENT_CLINIC_OR_DEPARTMENT_OTHER): Payer: BLUE CROSS/BLUE SHIELD

## 2014-11-10 ENCOUNTER — Ambulatory Visit: Payer: Self-pay

## 2014-11-10 ENCOUNTER — Other Ambulatory Visit (HOSPITAL_BASED_OUTPATIENT_CLINIC_OR_DEPARTMENT_OTHER): Payer: BLUE CROSS/BLUE SHIELD

## 2014-11-10 ENCOUNTER — Encounter: Payer: Self-pay | Admitting: Physician Assistant

## 2014-11-10 ENCOUNTER — Ambulatory Visit (HOSPITAL_BASED_OUTPATIENT_CLINIC_OR_DEPARTMENT_OTHER): Payer: BLUE CROSS/BLUE SHIELD | Admitting: Physician Assistant

## 2014-11-10 ENCOUNTER — Other Ambulatory Visit: Payer: Self-pay

## 2014-11-10 VITALS — BP 115/70 | HR 77 | Temp 97.8°F | Resp 16 | Ht 65.0 in | Wt 157.0 lb

## 2014-11-10 DIAGNOSIS — I82401 Acute embolism and thrombosis of unspecified deep veins of right lower extremity: Secondary | ICD-10-CM

## 2014-11-10 DIAGNOSIS — Z5111 Encounter for antineoplastic chemotherapy: Secondary | ICD-10-CM | POA: Diagnosis not present

## 2014-11-10 DIAGNOSIS — C50911 Malignant neoplasm of unspecified site of right female breast: Secondary | ICD-10-CM

## 2014-11-10 DIAGNOSIS — Z5189 Encounter for other specified aftercare: Secondary | ICD-10-CM

## 2014-11-10 DIAGNOSIS — C569 Malignant neoplasm of unspecified ovary: Secondary | ICD-10-CM

## 2014-11-10 DIAGNOSIS — Z79811 Long term (current) use of aromatase inhibitors: Secondary | ICD-10-CM | POA: Diagnosis not present

## 2014-11-10 DIAGNOSIS — Z86718 Personal history of other venous thrombosis and embolism: Secondary | ICD-10-CM

## 2014-11-10 DIAGNOSIS — Z170 Estrogen receptor positive status [ER+]: Secondary | ICD-10-CM | POA: Diagnosis not present

## 2014-11-10 LAB — CBC WITH DIFFERENTIAL/PLATELET
BASO%: 0.2 % (ref 0.0–2.0)
Basophils Absolute: 0 10*3/uL (ref 0.0–0.1)
EOS%: 0.8 % (ref 0.0–7.0)
Eosinophils Absolute: 0 10*3/uL (ref 0.0–0.5)
HCT: 35.8 % (ref 34.8–46.6)
HGB: 11.2 g/dL — ABNORMAL LOW (ref 11.6–15.9)
LYMPH%: 12.6 % — ABNORMAL LOW (ref 14.0–49.7)
MCH: 28.8 pg (ref 25.1–34.0)
MCHC: 31.3 g/dL — ABNORMAL LOW (ref 31.5–36.0)
MCV: 92 fL (ref 79.5–101.0)
MONO#: 0.4 10*3/uL (ref 0.1–0.9)
MONO%: 7.9 % (ref 0.0–14.0)
NEUT#: 4 10*3/uL (ref 1.5–6.5)
NEUT%: 78.5 % — ABNORMAL HIGH (ref 38.4–76.8)
Platelets: 113 10*3/uL — ABNORMAL LOW (ref 145–400)
RBC: 3.89 10*6/uL (ref 3.70–5.45)
RDW: 16.2 % — ABNORMAL HIGH (ref 11.2–14.5)
WBC: 5.1 10*3/uL (ref 3.9–10.3)
lymph#: 0.6 10*3/uL — ABNORMAL LOW (ref 0.9–3.3)

## 2014-11-10 LAB — COMPREHENSIVE METABOLIC PANEL (CC13)
ALK PHOS: 204 U/L — AB (ref 40–150)
ALT: 18 U/L (ref 0–55)
AST: 48 U/L — ABNORMAL HIGH (ref 5–34)
Albumin: 3 g/dL — ABNORMAL LOW (ref 3.5–5.0)
Anion Gap: 6 mEq/L (ref 3–11)
BUN: 7.8 mg/dL (ref 7.0–26.0)
CALCIUM: 8.7 mg/dL (ref 8.4–10.4)
CO2: 25 meq/L (ref 22–29)
CREATININE: 0.6 mg/dL (ref 0.6–1.1)
Chloride: 111 mEq/L — ABNORMAL HIGH (ref 98–109)
EGFR: >90 mL/min/{1.73_m2} (ref >90)
Glucose: 100 mg/dl (ref 70–140)
POTASSIUM: 4 meq/L (ref 3.5–5.1)
Sodium: 142 mEq/L (ref 136–145)
Total Bilirubin: 0.54 mg/dL (ref 0.20–1.20)
Total Protein: 6.3 g/dL — ABNORMAL LOW (ref 6.4–8.3)

## 2014-11-10 MED ORDER — PEGFILGRASTIM 6 MG/0.6ML ~~LOC~~ PSKT
6.0000 mg | PREFILLED_SYRINGE | Freq: Once | SUBCUTANEOUS | Status: AC
  Administered 2014-11-10: 6 mg via SUBCUTANEOUS
  Filled 2014-11-10: qty 0.6

## 2014-11-10 MED ORDER — SODIUM CHLORIDE 0.9 % IV SOLN
Freq: Once | INTRAVENOUS | Status: AC
  Administered 2014-11-10: 11:00:00 via INTRAVENOUS

## 2014-11-10 MED ORDER — HEPARIN SOD (PORK) LOCK FLUSH 100 UNIT/ML IV SOLN
500.0000 [IU] | Freq: Once | INTRAVENOUS | Status: AC
  Administered 2014-11-10: 500 [IU] via INTRAVENOUS
  Filled 2014-11-10: qty 5

## 2014-11-10 MED ORDER — PACLITAXEL PROTEIN-BOUND CHEMO INJECTION 100 MG
100.0000 mg/m2 | Freq: Once | INTRAVENOUS | Status: AC
Start: 2014-11-10 — End: 2014-11-10
  Administered 2014-11-10: 175 mg via INTRAVENOUS
  Filled 2014-11-10: qty 35

## 2014-11-10 MED ORDER — DEXAMETHASONE SODIUM PHOSPHATE 100 MG/10ML IJ SOLN
Freq: Once | INTRAMUSCULAR | Status: AC
  Administered 2014-11-10: 11:00:00 via INTRAVENOUS
  Filled 2014-11-10: qty 4

## 2014-11-10 MED ORDER — SODIUM CHLORIDE 0.9 % IJ SOLN
10.0000 mL | INTRAMUSCULAR | Status: DC | PRN
  Administered 2014-11-10: 10 mL via INTRAVENOUS
  Filled 2014-11-10: qty 10

## 2014-11-10 NOTE — Patient Instructions (Signed)
[facility] Discharge Instructions for Patients Receiving Chemotherapy  Today you received the following chemotherapy agents: Abraxane   To help prevent nausea and vomiting after your treatment, we encourage you to take your nausea medication as directed.    If you develop nausea and vomiting that is not controlled by your nausea medication, call the clinic.   BELOW ARE SYMPTOMS THAT SHOULD BE REPORTED IMMEDIATELY:  *FEVER GREATER THAN 100.5 F  *CHILLS WITH OR WITHOUT FEVER  NAUSEA AND VOMITING THAT IS NOT CONTROLLED WITH YOUR NAUSEA MEDICATION  *UNUSUAL SHORTNESS OF BREATH  *UNUSUAL BRUISING OR BLEEDING  TENDERNESS IN MOUTH AND THROAT WITH OR WITHOUT PRESENCE OF ULCERS  *URINARY PROBLEMS  *BOWEL PROBLEMS  UNUSUAL RASH Items with * indicate a potential emergency and should be followed up as soon as possible.  Feel free to call the clinic you have any questions or concerns. The clinic phone number is [phone].  Please show the CHEMO ALERT CARD at check-in to the Emergency Department and triage nurse.   

## 2014-11-10 NOTE — Progress Notes (Signed)
ID: [name]   DOB: October 13, 1955  MR#: [id number]  MWN#:[id number]  PCP: [name], NP GYN:  SU: [facility] OTHER QI:[id number] [name], Shanon Ace, Roney Jaffe  CHIEF COMPLAINT:  Estrogen receptor positive Breast Cancer; Ovarian cancer; DVT  CURRENT TREATMENT: Letrozole; abraxane; lovenox   BREAST CANCER HISTORY: From the original consult note:  [name] noted a mass in her right breast mid December 2013, and as it did not spontaneously resolve over a couple of weeks she brought it to her primary physician's attention. She was set up for diagnostic mammography and right breast ultrasonography at the breast Center 05/10/2012. (Note that the patient's most recent prior mammography had been in October 2008). The current study showed a spiculated mass in the superior subareolar portion of the right breast measuring approximately 5 cm and associated with pleomorphic calcifications. This was firm and palpable. There was right nipple retraction and skin thickening. Ultrasound confirmed an irregularly marginated hypoechoic mass measuring 3.5 cm by ultrasound, and an abnormal appearing lower right axillary lymph node measuring 2.6 cm.  Biopsies of both the breast mass and the abnormal appearing lymph node were performed 05/21/2012. Both showed an invasive ductal carcinoma, grade 2, with similar prognostic panels (the breast mass was 100% estrogen and 73% progesterone receptor positive, with an MIB-1 of 5%; the lymph node was 100% estrogen 100% progesterone receptor positive, with an MIB-1 of 20%). Both masses were HER-2 negative.  Breast MRI obtained at [facility] imaging 05/29/2012 confirmed a dominant mass in the retroareolar right breast measuring 4.4 cm maximally. There was a satellite nodule inferior and lateral to this mass, measuring 1.7 cm. There were no other masses in either breast. Aside from the previously biopsied lymph node there were other mildly  enhancing level I right axillary lymph nodes which did not appear pathologic. There was no other lymphadenopathy noted.   The patient's subsequent history is as detailed below.  OVARIAN CANCER HISTORY: From the 06/16/2014 summary note:  "[name] returns today for review of her restaging studies accompanied by her husband [name]. To summarize her recent history: She was admitted to [facility] with a diagnosis of possible pneumonia in late December 2015. Chest x-ray showed some increased density in the right upper lobe. CT scan was suggested and was obtained by Dr. Inda Castle. This showed 2 small nodules in the right middle lobe, measuring 8 and 4 mm respectively. Dr. Inda Castle then contacted [name] for further evaluation and we set [name] up for a PET scan and which was performed late January. This showed the 2 nodules in the lung not to be hypermetabolic. This of course does not prove that they are not malignant. More importantly, there was a 13.7 cm cystic/solid mass in the upper pelvis associated with adenopathy,. All of this was hypermetabolic."  The patient was evaluated by gynecologic oncology and on 06/27/2014 she underwent surgery under Dr. Denman George. This consisted of an exploratory laparotomy with hysterectomy and abdominal salpingo-oophorectomy, as well as tumor debulking and omentectomy. 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 The tumor measured 11.5 cm, focally involve the ovarian capsule on the left; the uterus and right ovary and fallopian tube were unremarkable. One para-aortic lymph node was positive out of a total of 6 lymph nodes sampled (2 left common iliac, 2 left para-aortic, and to within the omental resection).  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 Overall  she is tolerating the Abraxane relatively well. Her dose was recently reduced by 15% due to neutropenia. She reports some tingling in her right fifth finger but states that this is stable and does not interfere with her fine motor coordination.Her appetite remains good. She denied issues with nausea, vomiting, fever, hills, diarrhea or constipation. She is doing well with the Lovenox, with no bleeding or bruising problems. She is status post IVC filter placement 10/26/2014. She presents to proceed with her next ycle of reduced dose Abraxane.  REVIEW OF SYSTEMS: She voiced no specific complaints today. Overall a detailed review of systems today was stable  PAST MEDICAL HISTORY: Past Medical History  Diagnosis Date  . Depression   . DVT (deep venous thrombosis)     hx of on HRT left leg ~2006  . GERD (gastroesophageal reflux disease)   . Hyperlipidemia   . Hypertension   . Hypothyroidism   . PPD positive, treated     rx inh   . Diabetes mellitus   . 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 DJD (degenerative joint disease) of lumbar spine   . Chronic combined systolic and diastolic CHF (congestive heart failure)     a. 08/2012 Echo: EF 55-60%, no rwma, mod MR;  b. 07/2014 Echo: EF 45-50%, distal antsept HK, mod TR/MR, mildly bil-atrial enlargement.  . Paroxysmal atrial fibrillation     a. CHA2DS2VASc = 4-->coumadin.  [name] Physical deconditioning   . Allergy   . Anemia     low iron hx  . Cataract     removed ou  . OSA on CPAP     cpap 4.5 setting  . Breast cancer 2014    a. 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 Clear Cell Ovarian Cancer     a. 06/2014 s/p TAH/BSO debulking/lymph node dissection;  b. Now on chemo.    PAST SURGICAL HISTORY: Past Surgical History  Procedure Laterality Date  . Tubal ligation    . Cholecystectomy    . Foot surgery    . Eye  surgery    . Cataract extraction    . Percutaneous liver biopsy    . Breast biopsy      left breast  . Mastectomy modified radical  06/08/2012    Procedure: MASTECTOMY MODIFIED RADICAL;  Surgeon: Edward Jolly, MD;  Location: [facility];  Service: General;  Laterality: Right;  . Portacath placement  06/08/2012    Procedure: INSERTION PORT-A-CATH;  Surgeon: Edward Jolly, MD;  Location: [place];  Service: General;  Laterality: Left;  . Peg tube placement      peg removed 2015  . History of chemotherapy x 2 treatments, radiation tx  2014  . Pac removed  2015  . Laparotomy N/A 06/27/2014    Procedure: EXPLORATORY LAPAROTOMY;  Surgeon: Everitt Amber, MD;  Location: [facility];  Service: Gynecology;  Laterality: N/A;  . Abdominal hysterectomy N/A 06/27/2014    Procedure: HYSTERECTOMY ABDOMINAL TOTAL;  Surgeon: Everitt Amber, MD;  Location: [facility];  Service: Gynecology;  Laterality: N/A;  . Salpingoophorectomy Bilateral 06/27/2014    Procedure: BILATERAL SALPINGO OOPHORECTOMY/TUMOR DEBULKING/LYMPHNODE DISSECTION, OMENTECTOMY;  Surgeon: Everitt Amber, MD;  Location: [facility];  Service: Gynecology;  Laterality: Bilateral;    FAMILY HISTORY Family History  Problem Relation Age of Onset  . Diabetes Mother   . Hypertension Mother   . Arthritis Mother   . Heart disease Mother   . Heart failure Mother   . Other Mother     benign breast mass  . Stroke Father   . Heart disease Father   . Diabetes Paternal Grandmother   . Colon cancer Paternal Grandfather    the patient's father died in his 53s with a history of dementia. He had had prior strokes. The patient's mother died in her 70s, with a history of congestive heart failure. Ralynn had no brothers, one sister. There is no history of breast or ovarian cancer in the family.  GYNECOLOGIC HISTORY: Menarche age 38, first live birth age 81, she is GX P2, menopause approximately 15 years ago, on hormone replacement until 2010.  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 Her husband [name] works for [employer]. Daughter Paul Half is a Physiological scientist and lives in [place]. Daughter Santiago Bur and her family (husband and 2 children aged 9 and 3 years) currently live with the patient. Zianne is a member of a [employer].   ADVANCED DIRECTIVES: Not in place  HEALTH MAINTENANCE: (Updated October 2014) History  Substance Use Topics  . Smoking status: Former Smoker -- 1.00 packs/day for 1 years  . Smokeless tobacco: Never Used  . Alcohol Use: No     Colonoscopy: Never  PAP: Does not recall  Bone density: Never  Lipid panel:   Allergies  Allergen Reactions  . Olmesartan Medoxomil Cough    REACTION: ? if cough  . Venlafaxine Other (See Comments)    REACTION: severe dry mouth  . Adhesive [Tape] Rash  . Tetracycline Hcl Other (See Comments)    Unknown reaction, too long for patient to remember     Current Outpatient Prescriptions  Medication Sig Dispense Refill  . ALPRAZolam (XANAX XR) 1 MG 24 hr tablet Take 1 tablet (1 mg total) by mouth 2 (two) times daily. 240 tablet 0  . buPROPion (WELLBUTRIN XL) 150 MG 24 hr tablet Take 1 tablet (150 mg total) by mouth daily. 90 tablet 0  . clonazePAM (KLONOPIN) 0.5 MG tablet TAKE 1/2 TABLET TWICE DAILY AS NEEDED 30 tablet 0  . docusate sodium (COLACE) 100 MG capsule Take 1 capsule (100 mg total) by mouth 2 (two) times daily. 20 capsule 0  . enoxaparin (LOVENOX) 100 MG/ML injection Inject 1 mL (100 mg total) into the skin daily. 90 Syringe 2  . escitalopram (LEXAPRO) 20 MG tablet Take 20 mg by mouth daily.    . famotidine (PEPCID) 20 MG tablet Take 1 tablet (20 mg total) by mouth daily. 30 tablet 0  . furosemide (LASIX) 20 MG tablet Take 1 tablet (20 mg total) by mouth daily. 90 tablet 1  . letrozole (FEMARA) 2.5 MG tablet Take 1 tablet (2.5 mg total) by mouth daily. 90 tablet  0  . levothyroxine (SYNTHROID, LEVOTHROID) 150 MCG tablet Take 1 tablet (150 mcg total) by mouth daily. 90 tablet 1  . lidocaine-prilocaine (EMLA) cream Apply over port area 1-2 hours before chemotherapy 30 g 0  . megestrol (MEGACE) 40 MG/ML suspension TAKE 2 TEASPOONFULS EVERY DAY  0  . metFORMIN (GLUCOPHAGE) 500 MG tablet Take 500 mg by mouth 2 (two) times daily with a meal.    . metoprolol (LOPRESSOR) 50 MG tablet Take 1  tablet (50 mg total) by mouth 2 (two) times daily. 180 tablet 1  . Nutritional Supplements (ENSURE HIGH PROTEIN PO) Take 237 mLs by mouth 2 (two) times daily.    [name] OLANZapine (ZYPREXA) 2.5 MG tablet Take 2.5 mg by mouth at bedtime.    . ondansetron (ZOFRAN) 8 MG tablet Take 1 tablet (8 mg total) by mouth 2 (two) times daily. Start the day after chemo for 2 days. Then take as needed for nausea or vomiting. 30 tablet 1  . polyethylene glycol (MIRALAX / GLYCOLAX) packet Take 17 g by mouth daily. 14 each 0  . potassium chloride (K-DUR) 10 MEQ tablet Take 10 mEq by mouth 2 (two) times daily.  3  . prochlorperazine (COMPAZINE) 10 MG tablet Take 1 tablet (10 mg total) by mouth every 6 (six) hours as needed (Nausea or vomiting). 30 tablet 1   No current facility-administered medications for this visit.   Facility-Administered Medications Ordered in Other Visits  Medication Dose Route Frequency Provider Last Rate Last Dose  . sodium chloride 0.9 % injection 10 mL  10 mL Intravenous PRN Chauncey Cruel, MD   10 mL at 11/10/14 1306    OBJECTIVE: Middle-aged white [name] who appears stated age 8 Vitals:   11/10/14 0938  BP: 115/70  Pulse: 77  Temp: 97.8 F (36.6 C)  Resp: 16     Body mass index is 26.13 kg/(m^2).    ECOG FS: 1 Filed Weights   11/10/14 0938  Weight: 157 lb (71.215 kg)   Sclerae unicteric, pupils round and equal Oropharynx clear and moist-- no thrush or other lesions No cervical or supraclavicular adenopathy Lungs no rales or rhonchi Heart regular rate  and rhythm Abd soft, nontender, positive bowel sounds, no masses palpated, no fluid wave MSK no focal spinal tenderness, no upper extremity lymphedema Neuro: nonfocal, well oriented, appropriate affect Breasts: Deferred    LAB RESULTS: Results for KARINE, GARN (MRN [id number]) as of 10/27/2014 08:46  Ref. Range 06/16/2014 12:23 06/16/2014 12:23 08/11/2014 09:50 09/08/2014 12:36 10/06/2014 07:53  CA 125 Latest Ref Range: <35 U/mL 71 (H) 52.5 (H) 544 (H) 213 (H) 113 (H)   Lab Results  Component Value Date   WBC 5.1 11/10/2014   NEUTROABS 4.0 11/10/2014   HGB 11.2* 11/10/2014   HCT 35.8 11/10/2014   MCV 92.0 11/10/2014   PLT 113* 11/10/2014      Chemistry      Component Value Date/Time   NA 142 11/10/2014 0837   NA 135 10/02/2014 0415   K 4.0 11/10/2014 0837   K 3.8 10/02/2014 0415   CL 106 10/02/2014 0415   CL 104 10/29/2012 1058   CO2 25 11/10/2014 0837   CO2 20* 10/02/2014 0415   BUN 7.8 11/10/2014 0837   BUN 6 10/02/2014 0415   CREATININE 0.6 11/10/2014 0837   CREATININE 0.45 10/02/2014 0415   CREATININE 0.69 11/10/2013 0828      Component Value Date/Time   CALCIUM 8.7 11/10/2014 0837   CALCIUM 8.4* 10/02/2014 0415   ALKPHOS 204* 11/10/2014 0837   ALKPHOS 152* 08/18/2014 1320   AST 48* 11/10/2014 0837   AST 40* 08/18/2014 1320   ALT 18 11/10/2014 0837   ALT 20 08/18/2014 1320   BILITOT 0.54 11/10/2014 0837   BILITOT 0.8 08/18/2014 1320      STUDIES: Ir Ivc Filter Plmt / S&i /img Guid/mod Sed  10/26/2014   INDICATION: 59 year-old with history of breast cancer and bilateral lower extremity DVT.  Recent ultrasound demonstrated mobile thrombus in the right groin. Retrievable IVC filter Rhonda be placed for pulmonary embolism prophylaxis.  EXAM: IVC FILTER PLACEMENT; IVC VENOGRAM; ULTRASOUND FOR VASCULAR ACCESS  Physician: Stephan Minister. Henn, MD  FLUOROSCOPY TIME:  3 minutes and 6 seconds, 101 mGy.  MEDICATIONS: 1 mg Versed, 50 mcg fentanyl.  ANESTHESIA/SEDATION: Moderate sedation  time: 24 minutes  CONTRAST:  55 mL Omnipaque 300  PROCEDURE: The procedure was explained to the patient. The risks and benefits of the procedure were discussed and the patient's questions were addressed. Informed consent was obtained from the patient. Ultrasound demonstrated a patent right internal jugular vein. Ultrasound images were obtained for documentation. The right side of the neck was prepped and draped in a sterile fashion. Maximal barrier sterile technique was utilized including caps, mask, sterile gowns, sterile gloves, sterile drape, hand hygiene and skin antiseptic. The skin was anesthetized with 1% lidocaine. A 21 gauge needle was directed into the vein with ultrasound guidance and a micropuncture dilator set was placed. A wire was advanced into the IVC. The filter sheath was advanced over the wire into the IVC. An IVC venogram was performed. Fluoroscopic images were obtained for documentation. A Bard Denali filter was deployed below the renal veins. A follow-up venogram was performed and the vascular sheath was removed with manual compression.  FINDINGS: IVC was patent. Bilateral renal veins were identified. The IVC filter was deployed in the infrarenal IVC. The IVC just below the renal veins was felt to be slightly large for size. The IVC approximately 1 vertebral body below the renal veins was slightly smaller, measuring roughly 27 mm. This area was felt to be adequate for IVC filter placement. IVC filter was successfully placed in the infrarenal IVC and well positioned on the follow-up images.  Estimated blood loss:  Minimal  COMPLICATIONS: None  IMPRESSION: Successful placement of a retrievable IVC filter.  This IVC filter is potentially retrievable. The patient Rhonda be assessed for filter retrieval by Interventional Radiology in approximately 3 months. Further recommendations regarding filter retrieval, continued surveillance or declaration of device permanence, Rhonda be made at that time.    Electronically Signed   By: Markus Daft M.D.   On: 10/26/2014 17:24   US Venous Img Lower Bilateral  10/25/2014   CLINICAL DATA:  59 year old with bilateral lower extremity DVT. Status post mechanical thrombectomy to the deep vein thrombosis in the left common femoral vein and left femoral vein. Patient presents for follow-up.  EXAM: BILATERAL LOWER EXTREMITY VENOUS DOPPLER ULTRASOUND  TECHNIQUE: Gray-scale sonography with graded compression, as well as color Doppler and duplex ultrasound were performed to evaluate the lower extremity deep venous systems from the level of the common femoral vein and including the common femoral, femoral, profunda femoral, popliteal and calf veins including the posterior tibial, peroneal and gastrocnemius veins when visible. The superficial great saphenous vein was also interrogated. Spectral Doppler was utilized to evaluate flow at rest and with distal augmentation maneuvers in the common femoral, femoral and popliteal veins.  COMPARISON:  Venous duplex report from [facility] dated 10/01/2014. Images not available.  FINDINGS: RIGHT LOWER EXTREMITY  The proximal right common femoral vein is compressible and patent. There is nonocclusive thrombus in the distal right common femoral vein at the bifurcation of the femoral vein and deep femoral vein. This nonocclusive thrombus is mildly mobile and likely reflects the previously described mobile clot. There is no significant compressibility in the right femoral vein. There is at least partial compressibility  of the right posterior tibial veins. The right saphenofemoral junction is patent. Color Doppler flow in the right profunda femoral vein. Small amount of blood flow in the proximal right femoral vein. There is nonocclusive thrombus in the right popliteal vein. Right great saphenous vein is compressible and patent.  LEFT LOWER EXTREMITY  The proximal left common femoral vein is compressible. Nonocclusive thrombus in the left  common femoral vein at the level of the saphenofemoral junction. There is thrombus extending into the proximal left great saphenous vein. The left femoral vein is non compressible. Left popliteal vein is non compressible. There appears to be at least partial compressibility of the left posterior tibial veins. The proximal left common femoral vein has color Doppler flow. There appears to be nonocclusive thrombus in the proximal left profunda femoral vein. Minimal flow identified in the left femoral vein. Small amount of flow in the left popliteal vein. Although there may be partial compressibility of the left calf veins, there was no significant color Doppler flow. The proximal left great saphenous vein contains thrombus. The great saphenous vein distal to the proximal thigh is compressible and patent.  Other Findings:  None.  IMPRESSION: Persistent bilateral lower extremity deep vein thrombosis. The left lower extremity thrombus extends from the left saphenofemoral junction to the left popliteal vein and difficult to exclude calf thrombus. There is also superficial thrombosis of the proximal left great saphenous vein. The left lower extremity deep vein thrombosis has minimally changed from the previous examination.  Persistent deep vein thrombosis in the right lower extremity extending from the distal right common femoral vein to the right popliteal vein. Difficult to evaluate for right calf thrombus. Again noted is a nonocclusive and mobile clot in the right common femoral vein region which was previously described on the prior examination.   Electronically Signed   By: Markus Daft M.D.   On: 10/25/2014 10:35    ASSESSMENT: 59 y.o. [name]   (1)  status post right mastectomy  06/08/2012 for a pT3, pN1a, stage IIIA invasive ductal carcinoma, grade 2,  estrogen and progesterone receptor positive, HER-2/neu negative, with an MIB-1 of 20%.    (2)  treated in the adjuvant setting with docetaxel/  cyclophosphamide given every 3 weeks. There were multiple and severe complications, and the  patient tolerated only two cycles, last dose 07/29/2012  (3) letrozole started 08/16/2012  (a) osteopenia, with a T score of -1.14 April 2013  (4) postmastectomy radiation completed 12/24/2012  (5)  comorbidities include diabetes, hypertension, chronic liver disease with cirrhosis and fatty liver, and nutritional disturbance with temporarily dependence on PEG feeds (discontinued July 2014).  (6) restaging studies January 2016 show  (a) 8 mm and 4 mm Right middle lobe lung nodules, not metabolically active on PET -- Rhonda require follow-up  (b) hypermetabolic 17.5 cm mixed solid/cystic lesion in upper pelvis, with associated adenopathy  (7) status post exploratory laparotomy 06/27/2014 with total abdominal hysterectomy, bilateral salpingo-oophorectomy, para-aortic lymphadenectomy, omentectomy and radical tumor debulking for a clear cell ovarian cancer, pT1c pN1, stage IIIC .  (8) adjuvant chemotherapy consisting of carboplatin and paclitaxel given weekly days 1 and 8 of each 21 day cycle, for 6-8 cycles as tolerated, started 07/17/2014, with onpro support  (a) day 8 cycle 2 omitted and further treatment delayed because of an episode of Klebsiella sepsis requiring intensive care unit admission  (9) bilateral lower extremity DVTs documented 09/25/2014 despite being on Coumadin for PAF (subtherapeutic INR) with mobile Right common femoral clot  (  a) status post thrombolysis 09/30/2014  (b) on Lovenox daily as of 10/02/2014  (c) IVC filter placed 10/26/2014 as mobile clot still noted Right groin  (10) started abraxane 10/13/2014, to be repeated every 14 days  (11) genetics testing pending   PLAN: Akshara's counts have recovered nicely and she Rhonda proceed with her cycle of reduced dose Abraxane today as scheduled.  We are continuing the Lovenox. We plan to remove the IVC filter in 3 months  We Rhonda  check Derrisha's labwork every Friday. She Rhonda see [name] every 14 days, the day of treatment, to make sure she can proceed to therapy.. After 4-6 cycles, depending on the results of the CEA 125, we Rhonda consider restaging studies.  She has been scheduled to meet with the genetics counselor.  We should have those results before the end of July.  Dnasia knows to call for any problems that may develop for her next visit here, but particularly if any fever or bleeding problems develop.    Carlton Adam, PA-C    11/10/2014

## 2014-11-14 ENCOUNTER — Encounter: Payer: Self-pay | Admitting: Family

## 2014-11-14 ENCOUNTER — Ambulatory Visit (INDEPENDENT_AMBULATORY_CARE_PROVIDER_SITE_OTHER): Payer: BLUE CROSS/BLUE SHIELD | Admitting: Family

## 2014-11-14 VITALS — BP 120/58 | HR 78 | Temp 98.0°F | Resp 16 | Ht 64.0 in | Wt 156.4 lb

## 2014-11-14 DIAGNOSIS — J029 Acute pharyngitis, unspecified: Secondary | ICD-10-CM

## 2014-11-14 DIAGNOSIS — J020 Streptococcal pharyngitis: Secondary | ICD-10-CM | POA: Insufficient documentation

## 2014-11-14 DIAGNOSIS — R634 Abnormal weight loss: Secondary | ICD-10-CM

## 2014-11-14 LAB — TSH: TSH: 1.85 u[IU]/mL (ref 0.35–4.50)

## 2014-11-14 LAB — POCT RAPID STREP A (OFFICE): Rapid Strep A Screen: POSITIVE — AB

## 2014-11-14 MED ORDER — AMOXICILLIN 500 MG PO CAPS: 500.0000 mg | ORAL_CAPSULE | Freq: Three times a day (TID) | ORAL | Status: DC

## 2014-11-14 NOTE — Assessment & Plan Note (Signed)
Rapid strep +. Will rx with amoxicillin.   

## 2014-11-14 NOTE — Progress Notes (Signed)
Subjective:    Patient ID: [name], female    DOB: December 01, 1955, 59 y.o.   MRN: [id number]  HPI  [name] is a 59 yr old female who presents today with chief complaint of sore throat.  Reports sore throat x 2 days. Sore throat is "not that bad." Denies fever, nausea or headache.  3 family members have been diagnosed with strep.   Weight loss- reports that she "eats like a pig." also drinking ensure one can bid.  Wt Readings from Last 3 Encounters:  11/14/14 156 lb 6.4 oz (70.943 kg)  11/10/14 157 lb (71.215 kg)  10/27/14 162 lb 11.2 oz (73.8 kg)     Review of Systems    see HPI  Past Medical History  Diagnosis Date  . Depression   . DVT (deep venous thrombosis)     hx of on HRT left leg ~2006  . GERD (gastroesophageal reflux disease)   . Hyperlipidemia   . Hypertension   . Hypothyroidism   . PPD positive, treated     rx inh   . Diabetes mellitus   . 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 DJD (degenerative joint disease) of lumbar spine   . Chronic combined systolic and diastolic CHF (congestive heart failure)     a. 08/2012 Echo: EF 55-60%, no rwma, mod MR;  b. 07/2014 Echo: EF 45-50%, distal antsept HK, mod TR/MR, mildly bil-atrial enlargement.  . Paroxysmal atrial fibrillation     a. CHA2DS2VASc = 4-->coumadin.  [name] Physical deconditioning   . Allergy   . Anemia     low iron hx  . Cataract     removed ou  . OSA on CPAP     cpap 4.5 setting  . Breast cancer 2014    a. 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 Clear Cell Ovarian Cancer     a. 06/2014 s/p TAH/BSO debulking/lymph node dissection;  b. Now on chemo.    History   Social History  . Marital Status: Married    Spouse Name: N/A  . Number of Children: 2  . Years of Education: N/A   Occupational History  . [place] History Main Topics  .  Smoking status: Former Smoker -- 1.00 packs/day for 1 years  . Smokeless tobacco: Never Used  . Alcohol Use: No  . Drug Use: No     Comment: quit age 30 only smoked as a teen 1 year  . Sexual Activity: No   Other Topics Concern  . 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     Past Surgical History  Procedure Laterality Date  . Tubal ligation    . Cholecystectomy    . Foot surgery    . Eye surgery    . Cataract extraction    . Percutaneous liver biopsy    . Breast biopsy      left breast  . Mastectomy modified radical  06/08/2012    Procedure: MASTECTOMY MODIFIED RADICAL;  Surgeon: Edward Jolly, MD;  Location: [place];  Service:  General;  Laterality: Right;  . Portacath placement  06/08/2012    Procedure: INSERTION PORT-A-CATH;  Surgeon: Edward Jolly, MD;  Location: [place];  Service: General;  Laterality: Left;  . Peg tube placement      peg removed 2015  . History of chemotherapy x 2 treatments, radiation tx  2014  . Pac removed  2015  . Laparotomy N/A 06/27/2014    Procedure: EXPLORATORY LAPAROTOMY;  Surgeon: Everitt Amber, MD;  [facility];  Service: Gynecology;  Laterality: N/A;  . Abdominal hysterectomy N/A 06/27/2014    Procedure: HYSTERECTOMY ABDOMINAL TOTAL;  Surgeon: Everitt Amber, MD;  [facility];  Service: Gynecology;  Laterality: N/A;  . Salpingoophorectomy Bilateral 06/27/2014    Procedure: BILATERAL SALPINGO OOPHORECTOMY/TUMOR DEBULKING/LYMPHNODE DISSECTION, OMENTECTOMY;  Surgeon: Everitt Amber, MD;  [facility];  Service: Gynecology;  Laterality: Bilateral;    Family History  Problem Relation Age of Onset  . Diabetes Mother   . Hypertension Mother   . Arthritis Mother   . Heart disease Mother   . Heart failure Mother   . Other Mother     benign breast mass  .  Stroke Father   . Heart disease Father   . Diabetes Paternal Grandmother   . Colon cancer Paternal Grandfather     Allergies  Allergen Reactions  . Olmesartan Medoxomil Cough    REACTION: ? if cough  . Venlafaxine Other (See Comments)    REACTION: severe dry mouth  . Adhesive [Tape] Rash  . Tetracycline Hcl Other (See Comments)    Unknown reaction, too long for patient to remember     Current Outpatient Prescriptions on File Prior to Visit  Medication Sig Dispense Refill  . ALPRAZolam (XANAX XR) 1 MG 24 hr tablet Take 1 tablet (1 mg total) by mouth 2 (two) times daily. 240 tablet 0  . buPROPion (WELLBUTRIN XL) 150 MG 24 hr tablet Take 1 tablet (150 mg total) by mouth daily. 90 tablet 0  . clonazePAM (KLONOPIN) 0.5 MG tablet TAKE 1/2 TABLET TWICE DAILY AS NEEDED 30 tablet 0  . docusate sodium (COLACE) 100 MG capsule Take 1 capsule (100 mg total) by mouth 2 (two) times daily. 20 capsule 0  . enoxaparin (LOVENOX) 100 MG/ML injection Inject 1 mL (100 mg total) into the skin daily. 90 Syringe 2  . escitalopram (LEXAPRO) 20 MG tablet Take 20 mg by mouth daily.    . famotidine (PEPCID) 20 MG tablet Take 1 tablet (20 mg total) by mouth daily. 30 tablet 0  . furosemide (LASIX) 20 MG tablet Take 1 tablet (20 mg total) by mouth daily. 90 tablet 1  . letrozole (FEMARA) 2.5 MG tablet Take 1 tablet (2.5 mg total) by mouth daily. 90 tablet 0  . levothyroxine (SYNTHROID, LEVOTHROID) 150 MCG tablet Take 1 tablet (150 mcg total) by mouth daily. 90 tablet 1  . lidocaine-prilocaine (EMLA) cream Apply over port area 1-2 hours before chemotherapy 30 g 0  . metFORMIN (GLUCOPHAGE) 500 MG tablet Take 500 mg by mouth 2 (two) times daily with a meal.    . metoprolol (LOPRESSOR) 50 MG tablet Take 1 tablet (50 mg total) by mouth 2 (two) times daily. 180 tablet 1  . Nutritional Supplements (ENSURE HIGH PROTEIN PO) Take 237 mLs by mouth 2 (two) times daily.    [name] OLANZapine (ZYPREXA) 2.5 MG tablet Take 2.5 mg by  mouth at bedtime.    . ondansetron (ZOFRAN) 8 MG tablet Take 1 tablet (8 mg total)  by mouth 2 (two) times daily. Start the day after chemo for 2 days. Then take as needed for nausea or vomiting. 30 tablet 1  . potassium chloride (K-DUR) 10 MEQ tablet Take 10 mEq by mouth 2 (two) times daily.  3  . prochlorperazine (COMPAZINE) 10 MG tablet Take 1 tablet (10 mg total) by mouth every 6 (six) hours as needed (Nausea or vomiting). 30 tablet 1   No current facility-administered medications on file prior to visit.    BP 120/58 mmHg  Pulse 78  Temp(Src) 98 F (36.7 C) (Oral)  Resp 16  Ht 5\' 4"  (1.626 m)  Wt 156 lb 6.4 oz (70.943 kg)  BMI 26.83 kg/m2  SpO2 99%    Objective:   Physical Exam  Constitutional: She is oriented to person, place, and time. She appears well-developed and well-nourished.  HENT:  Right Ear: Tympanic membrane and ear canal normal.  Left Ear: Tympanic membrane and ear canal normal.  Mouth/Throat: Posterior oropharyngeal erythema present. No oropharyngeal exudate or posterior oropharyngeal edema.  Cardiovascular: Normal rate, regular rhythm and normal heart sounds.   No murmur heard. Pulmonary/Chest: Effort normal and breath sounds normal. No respiratory distress. She has no wheezes.  Lymphadenopathy:    She has cervical adenopathy.  Neurological: She is alert and oriented to person, place, and time.  Skin: Skin is warm and dry.  Psychiatric: She has a normal mood and affect. Her behavior is normal. Judgment and thought content normal.          Assessment & Plan:

## 2014-11-14 NOTE — Assessment & Plan Note (Signed)
Advised pt to continue ensure, 3 regular well balanced meals and 2 snacks.  Monitor weight weekly- call if further weight loss. Obtain TSH to assess thyroid.

## 2014-11-14 NOTE — Patient Instructions (Signed)
Please lab work prior to leaving including UDS. Weigh yourself weekly, call if further weight loss. Start amoxicillin for strep throat- call if symptoms worsen or do not improve. Please schedule a follow up appointment in 1 month.

## 2014-11-14 NOTE — Progress Notes (Signed)
Pre visit review using our clinic review tool, if applicable. No additional management support is needed unless otherwise documented below in the visit note. 

## 2014-11-15 NOTE — Patient Instructions (Signed)
Continue labs and chemotherapy as scheduled  Follow up in 2 weeks 

## 2014-11-16 ENCOUNTER — Encounter: Payer: Self-pay | Admitting: Genetic Counselor

## 2014-11-16 ENCOUNTER — Encounter: Payer: Self-pay | Admitting: General Practice

## 2014-11-16 ENCOUNTER — Ambulatory Visit (HOSPITAL_BASED_OUTPATIENT_CLINIC_OR_DEPARTMENT_OTHER): Payer: BLUE CROSS/BLUE SHIELD | Admitting: Genetic Counselor

## 2014-11-16 ENCOUNTER — Other Ambulatory Visit (HOSPITAL_BASED_OUTPATIENT_CLINIC_OR_DEPARTMENT_OTHER): Payer: BLUE CROSS/BLUE SHIELD

## 2014-11-16 DIAGNOSIS — C569 Malignant neoplasm of unspecified ovary: Secondary | ICD-10-CM

## 2014-11-16 DIAGNOSIS — K760 Fatty (change of) liver, not elsewhere classified: Secondary | ICD-10-CM

## 2014-11-16 DIAGNOSIS — C50911 Malignant neoplasm of unspecified site of right female breast: Secondary | ICD-10-CM | POA: Diagnosis not present

## 2014-11-16 DIAGNOSIS — Z800 Family history of malignant neoplasm of digestive organs: Secondary | ICD-10-CM

## 2014-11-16 DIAGNOSIS — Z853 Personal history of malignant neoplasm of breast: Secondary | ICD-10-CM

## 2014-11-16 DIAGNOSIS — I4891 Unspecified atrial fibrillation: Secondary | ICD-10-CM

## 2014-11-16 LAB — COMPREHENSIVE METABOLIC PANEL (CC13)
ALK PHOS: 198 U/L — AB (ref 40–150)
ALT: 21 U/L (ref 0–55)
AST: 36 U/L — ABNORMAL HIGH (ref 5–34)
Albumin: 3.1 g/dL — ABNORMAL LOW (ref 3.5–5.0)
Anion Gap: 8 mEq/L (ref 3–11)
BILIRUBIN TOTAL: 0.68 mg/dL (ref 0.20–1.20)
BUN: 4.8 mg/dL — AB (ref 7.0–26.0)
CO2: 25 meq/L (ref 22–29)
CREATININE: 0.5 mg/dL — AB (ref 0.6–1.1)
Calcium: 8.9 mg/dL (ref 8.4–10.4)
Chloride: 107 mEq/L (ref 98–109)
GLUCOSE: 74 mg/dL (ref 70–140)
Potassium: 4 mEq/L (ref 3.5–5.1)
Sodium: 139 mEq/L (ref 136–145)
Total Protein: 6.3 g/dL — ABNORMAL LOW (ref 6.4–8.3)

## 2014-11-16 LAB — CBC WITH DIFFERENTIAL/PLATELET
BASO%: 1.9 % (ref 0.0–2.0)
BASOS ABS: 0 10*3/uL (ref 0.0–0.1)
EOS ABS: 0 10*3/uL (ref 0.0–0.5)
EOS%: 2.5 % (ref 0.0–7.0)
HEMATOCRIT: 32.4 % — AB (ref 34.8–46.6)
HGB: 10.5 g/dL — ABNORMAL LOW (ref 11.6–15.9)
LYMPH%: 32.2 % (ref 14.0–49.7)
MCH: 29.1 pg (ref 25.1–34.0)
MCHC: 32.5 g/dL (ref 31.5–36.0)
MCV: 89.7 fL (ref 79.5–101.0)
MONO#: 0.2 10*3/uL (ref 0.1–0.9)
MONO%: 12.3 % (ref 0.0–14.0)
NEUT%: 51.1 % (ref 38.4–76.8)
NEUTROS ABS: 0.8 10*3/uL — AB (ref 1.5–6.5)
PLATELETS: 113 10*3/uL — AB (ref 145–400)
RBC: 3.61 10*6/uL — ABNORMAL LOW (ref 3.70–5.45)
RDW: 17.6 % — ABNORMAL HIGH (ref 11.2–14.5)
WBC: 1.6 10*3/uL — AB (ref 3.9–10.3)
lymph#: 0.5 10*3/uL — ABNORMAL LOW (ref 0.9–3.3)

## 2014-11-16 NOTE — Progress Notes (Signed)
Spiritual Care Note  Providing spiritual and emotional support to [name] and her husband as they process past health struggles, current gratitude, and future goals.  Spoke with them in lobby earlier in week and had lengthier conversation in [place] this morning.  [name] was in good spirits, noting that she felt especially good today.  They had a relaxed and meaningful weekend away in [place] earlier this summer, plan a road trip for [place] pe[place], and hope to schedule a family beach trip this summer or fall (which is a Lobbyist of [name]'s).  They note the joy and complication of sharing a home with children and grandchildren.  Perspective--particularly in light of past health issues--is a key coping tool for each of them.  Engaging with exuberant grandchildren is another.  They verbalized gratitude for each other, naming specific gifts and help they have received.  Served as witness to their stories and reflections, providing pastoral presence and reflective listening.  Will continue to follow, but please also page as needs arise.  Thank you.  [place], [place]

## 2014-11-16 NOTE — Progress Notes (Signed)
REFERRING PROVIDER: Lurline Del, MD  PRIMARY PROVIDER:  [name], NP  PRIMARY REASON FOR VISIT:  1. Ovarian cancer, unspecified laterality   2. History of breast cancer   3. Family history of colon cancer      HISTORY OF PRESENT ILLNESS:   [name], a 59 y.o. female, was seen for a [place] cancer genetics consultation at the request of Dr. Inda Castle due to a personal history of ovarian and breast cancer and family history of colon cancer.  [name] presents to clinic today to discuss the possibility of a hereditary predisposition to cancer, genetic testing, and to further clarify her future cancer risks, as well as potential cancer risks for family members.   In February 2016, at the age of 68, [name] was diagnosed with clear cell ovarian cancer. This was treated with TAH-BSO and chemotherapy.  In 2014 at the age of 5, [name] was diagnosed with invasive ductal carcinoma of the right breast. The hormone receptor status was ER/PR+, Her2-.  This was treated with right mastectomy and chemotherapy.  CANCER HISTORY:   No history exists.  February 2016 - Dx with clear cell ovarian cancer 2014 - IDC of right breast; ER/PR+, Her2-  HORMONAL RISK FACTORS:  Menarche was at age 67.  First live birth at age 31.  OCP use for approximately 0 years.  Ovaries intact: no.  Hysterectomy: yes.  Menopausal status: postmenopausal.  HRT use: 9-10 years. Colonoscopy: no; not examined. Mammogram within the last year: yes. Number of breast biopsies: 1. Up to date with pelvic exams:  no. Any excessive radiation exposure in the past:  no  Past Medical History  Diagnosis Date  . Depression   . DVT (deep venous thrombosis)     hx of on HRT left leg ~2006  . GERD (gastroesophageal reflux disease)   . Hyperlipidemia   . Hypertension   . Hypothyroidism   . PPD positive, treated     rx inh   . Diabetes mellitus   . Liver disease, chronic, with cirrhosis     ?  autoimmune  . DJD (degenerative joint disease) of lumbar spine   . Chronic combined systolic and diastolic CHF (congestive heart failure)     a. 08/2012 Echo: EF 55-60%, no rwma, mod MR;  b. 07/2014 Echo: EF 45-50%, distal antsept HK, mod TR/MR, mildly bil-atrial enlargement.  . Paroxysmal atrial fibrillation     a. CHA2DS2VASc = 4-->coumadin.  [name] Physical deconditioning   . Allergy   . Anemia     low iron hx  . Cataract     removed ou  . OSA on CPAP     cpap 4.5 setting  . Breast cancer 2014    a. 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 Clear Cell Ovarian Cancer     a. 06/2014 s/p TAH/BSO debulking/lymph node dissection;  b. Now on chemo.    Past Surgical History  Procedure Laterality Date  . Tubal ligation    . Cholecystectomy    . Foot surgery    . Eye surgery    . Cataract extraction    . Percutaneous liver biopsy    . Breast biopsy      left breast  . Mastectomy modified radical  06/08/2012    Procedure: MASTECTOMY MODIFIED RADICAL;  Surgeon: Edward Jolly, MD;  Location: [place] [facility];  Service: General;  Laterality:  Right;  . Portacath placement  06/08/2012    Procedure: INSERTION PORT-A-CATH;  Surgeon: Edward Jolly, MD;  Location: [place];  Service: General;  Laterality: Left;  . Peg tube placement      peg removed 2015  . History of chemotherapy x 2 treatments, radiation tx  2014  . Pac removed  2015  . Laparotomy N/A 06/27/2014    Procedure: EXPLORATORY LAPAROTOMY;  Surgeon: Everitt Amber, MD;  [facility];  Service: Gynecology;  Laterality: N/A;  . Abdominal hysterectomy N/A 06/27/2014    Procedure: HYSTERECTOMY ABDOMINAL TOTAL;  Surgeon: Everitt Amber, MD;  [facility];  Service: Gynecology;  Laterality: N/A;  . Salpingoophorectomy Bilateral 06/27/2014    Procedure: BILATERAL SALPINGO OOPHORECTOMY/TUMOR DEBULKING/LYMPHNODE DISSECTION, OMENTECTOMY;   Surgeon: Everitt Amber, MD;  [facility];  Service: Gynecology;  Laterality: Bilateral;    History   Social History  . Marital Status: Married    Spouse Name: N/A  . Number of Children: 2  . Years of Education: N/A   Occupational History  . [place] History Main Topics  . Smoking status: Former Smoker -- 1.00 packs/day for 1 years  . Smokeless tobacco: Never Used  . Alcohol Use: No  . Drug Use: No     Comment: quit age 10 only smoked as a teen 1 year  . Sexual Activity: No   Other Topics Concern  . 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      FAMILY HISTORY:  We obtained a detailed, 4-generation family history.  Significant diagnoses are listed below: Family History  Problem Relation Age of Onset  . Diabetes Mother   . Hypertension Mother   . Arthritis Mother   . Heart disease Mother   . Heart failure Mother   . Other Mother     benign breast mass  . Stroke Father   . Heart disease Father   . Dementia Father   . Diabetes Paternal Grandmother   . Colon cancer Paternal Grandfather     dx. 70s-80s  . Other Daughter     potentially has had negative BRCA testing  . Other Daughter     hysterectomy; potentially has had negative genetic testing    [name] has two daughters, ages 94 and 40 who are cancer-free and who have reportedly had negative BRCA testing.  There is no known maternal family history of cancer.  [name] mother passed away in her 33s.  There were four maternal aunts and two uncles--all but one passed away in their 70s/80s and all were cancer-free.  One maternal uncle is in his early 29s and has not had cancer.  [name] maternal grandparents also lived to later ages with no known cancers.  [name] father passed away in his 69s and had  no known cancers.  He was an only child.  [name] paternal grandfather was diagnosed with colon cancer in his 11s or 68s.  [name] paternal grandmother lived to a later age.  [name] is unaware of any additional cancers in the family.  Patient's maternal ancestors are of Caucasian descent, and paternal ancestors are of Caucasian and Native American ([place]) descent. There  is no reported Ashkenazi Jewish ancestry. There is no known consanguinity.  GENETIC COUNSELING ASSESSMENT: [name] is a 59 y.o. female with a personal history of cancer which somewhat suggestive of a hereditary cancer syndrome and predisposition to cancer. We, therefore, discussed and recommended the following at today's visit.   DISCUSSION: We reviewed the characteristics, features and inheritance patterns of hereditary cancer syndromes, particularly those associated with changes within the BRCA and Lynch syndrome genes based on her cancer history and her type of ovarian cancer. We also discussed genetic testing, including the appropriate family members to test, the process of testing, insurance coverage and turn-around-time for results. We discussed the implications of a negative, positive and/or variant of uncertain significant result. We recommended [name] pursue genetic testing for the 21-gene Breast/Ovarian Cancer panel through [facility] Hope Pigeon, MD).   Based on [name]'s personal history of cancer, she meets medical criteria for genetic testing. Despite that she meets criteria, she may still have an out of pocket cost. We discussed that if her out of pocket cost for testing is over $100, the laboratory will call and confirm whether she wants to proceed with testing.  If the out of pocket cost of testing is less than $100 she will be billed by the genetic testing laboratory.   PLAN: After considering the risks, benefits, and limitations, [name]  provided informed consent to pursue  genetic testing and the blood sample was sent to [facility] for analysis of the 21-gene Breast/Ovarian Cancer panel test. The Breast/Ovarian gene panel offered by [facility] includes sequencing and deletion/duplication analysis for the following 20 genes:  ATM, BARD1, BRCA1, BRCA2, BRIP1, CDH1, CHEK2, FANCC, MLH1, MSH2, MSH6, NBN, PALB2, PMS2, PTEN, RAD51C, RAD51D, STK11, TP53, and XRCC2. The panel also includes deletion/duplication analysis (without sequencing) for one gene, EPCAM.  Results should be available within approximately 2-3 weeks' time, at which point they will be disclosed by telephone to [name], as will any additional recommendations warranted by these results. [name] will receive a summary of her genetic counseling visit and a copy of her results once available. This information will also be available in Epic. We encouraged Ms. Fleener to remain in contact with cancer genetics annually so that we can continuously update the family history and inform her of any changes in cancer genetics and testing that may be of benefit for her family. Ms. Fitzpatrick questions were answered to her satisfaction today. Our contact information was provided should additional questions or concerns arise.  Thank you for the referral and allowing [name] to share in the care of your patient.   Jeanine Luz, MS Genetic Counselor [email or web address] Phone: [phone]  The patient was seen for a total of 60 minutes in face-to-face genetic counseling.  This patient was discussed with Drs. Magrinat, Lindi Adie and/or Burr Medico who agrees with the above.    _______________________________________________________________________ For Office Staff:  Number of people involved in session: 2 Was an Intern/ student involved with case: no

## 2014-11-17 LAB — CA 125: CA 125: 86 U/mL — ABNORMAL HIGH (ref <35)

## 2014-11-21 ENCOUNTER — Telehealth: Payer: Self-pay | Admitting: *Deleted

## 2014-11-21 MED ORDER — CLONAZEPAM 0.5 MG PO TABS: ORAL_TABLET | ORAL | Status: DC

## 2014-11-21 NOTE — Telephone Encounter (Signed)
Received fax from [facility] requesting 90 day supply of Clonazepam. Notified pt's spouse that NP can only do 30 day supply of controlled substance and we will send refill to local pharmacy. He voices understanding. Last Rx 10/6714, #30.  UDS given 05/2014 and [place] signed 11/2014.  Rx printed and forwarded to Provider for signature.

## 2014-11-21 NOTE — Telephone Encounter (Signed)
Rx faxed to pharmacy  

## 2014-11-24 ENCOUNTER — Other Ambulatory Visit (HOSPITAL_BASED_OUTPATIENT_CLINIC_OR_DEPARTMENT_OTHER): Payer: BLUE CROSS/BLUE SHIELD

## 2014-11-24 ENCOUNTER — Ambulatory Visit (HOSPITAL_BASED_OUTPATIENT_CLINIC_OR_DEPARTMENT_OTHER): Payer: BLUE CROSS/BLUE SHIELD | Admitting: Nurse Practitioner

## 2014-11-24 ENCOUNTER — Encounter: Payer: Self-pay | Admitting: Oncology

## 2014-11-24 ENCOUNTER — Ambulatory Visit (HOSPITAL_BASED_OUTPATIENT_CLINIC_OR_DEPARTMENT_OTHER): Payer: BLUE CROSS/BLUE SHIELD

## 2014-11-24 ENCOUNTER — Encounter: Payer: Self-pay | Admitting: Nurse Practitioner

## 2014-11-24 VITALS — BP 110/62 | HR 75 | Temp 98.4°F | Resp 18 | Ht 64.0 in | Wt 158.0 lb

## 2014-11-24 DIAGNOSIS — K760 Fatty (change of) liver, not elsewhere classified: Secondary | ICD-10-CM

## 2014-11-24 DIAGNOSIS — Z5189 Encounter for other specified aftercare: Secondary | ICD-10-CM | POA: Diagnosis not present

## 2014-11-24 DIAGNOSIS — Z170 Estrogen receptor positive status [ER+]: Secondary | ICD-10-CM | POA: Diagnosis not present

## 2014-11-24 DIAGNOSIS — I4891 Unspecified atrial fibrillation: Secondary | ICD-10-CM

## 2014-11-24 DIAGNOSIS — C50911 Malignant neoplasm of unspecified site of right female breast: Secondary | ICD-10-CM | POA: Diagnosis not present

## 2014-11-24 DIAGNOSIS — Z5111 Encounter for antineoplastic chemotherapy: Secondary | ICD-10-CM

## 2014-11-24 DIAGNOSIS — C569 Malignant neoplasm of unspecified ovary: Secondary | ICD-10-CM

## 2014-11-24 DIAGNOSIS — Z79811 Long term (current) use of aromatase inhibitors: Secondary | ICD-10-CM | POA: Diagnosis not present

## 2014-11-24 DIAGNOSIS — Z86718 Personal history of other venous thrombosis and embolism: Secondary | ICD-10-CM

## 2014-11-24 LAB — COMPREHENSIVE METABOLIC PANEL (CC13)
ALBUMIN: 2.9 g/dL — AB (ref 3.5–5.0)
ALT: 18 U/L (ref 0–55)
ANION GAP: 7 meq/L (ref 3–11)
AST: 35 U/L — ABNORMAL HIGH (ref 5–34)
Alkaline Phosphatase: 206 U/L — ABNORMAL HIGH (ref 40–150)
BILIRUBIN TOTAL: 0.55 mg/dL (ref 0.20–1.20)
BUN: 5.5 mg/dL — AB (ref 7.0–26.0)
CALCIUM: 8.9 mg/dL (ref 8.4–10.4)
CHLORIDE: 109 meq/L (ref 98–109)
CO2: 23 mEq/L (ref 22–29)
Creatinine: 0.6 mg/dL (ref 0.6–1.1)
EGFR: >90 mL/min/{1.73_m2} (ref >90)
GLUCOSE: 104 mg/dL (ref 70–140)
Potassium: 3.9 mEq/L (ref 3.5–5.1)
SODIUM: 139 meq/L (ref 136–145)
Total Protein: 6.1 g/dL — ABNORMAL LOW (ref 6.4–8.3)

## 2014-11-24 LAB — CBC WITH DIFFERENTIAL/PLATELET
BASO%: 0.4 % (ref 0.0–2.0)
Basophils Absolute: 0 10*3/uL (ref 0.0–0.1)
EOS ABS: 0.1 10*3/uL (ref 0.0–0.5)
EOS%: 1.4 % (ref 0.0–7.0)
HCT: 34.1 % — ABNORMAL LOW (ref 34.8–46.6)
HGB: 11 g/dL — ABNORMAL LOW (ref 11.6–15.9)
LYMPH%: 11.1 % — ABNORMAL LOW (ref 14.0–49.7)
MCH: 28.2 pg (ref 25.1–34.0)
MCHC: 32.2 g/dL (ref 31.5–36.0)
MCV: 87.5 fL (ref 79.5–101.0)
MONO#: 0.4 10*3/uL (ref 0.1–0.9)
MONO%: 7.3 % (ref 0.0–14.0)
NEUT#: 4.3 10*3/uL (ref 1.5–6.5)
NEUT%: 79.8 % — AB (ref 38.4–76.8)
PLATELETS: 162 10*3/uL (ref 145–400)
RBC: 3.89 10*6/uL (ref 3.70–5.45)
RDW: 18.3 % — ABNORMAL HIGH (ref 11.2–14.5)
WBC: 5.3 10*3/uL (ref 3.9–10.3)
lymph#: 0.6 10*3/uL — ABNORMAL LOW (ref 0.9–3.3)

## 2014-11-24 MED ORDER — PACLITAXEL PROTEIN-BOUND CHEMO INJECTION 100 MG
100.0000 mg/m2 | Freq: Once | INTRAVENOUS | Status: AC
Start: 2014-11-24 — End: 2014-11-24
  Administered 2014-11-24: 175 mg via INTRAVENOUS
  Filled 2014-11-24: qty 35

## 2014-11-24 MED ORDER — HEPARIN SOD (PORK) LOCK FLUSH 100 UNIT/ML IV SOLN
500.0000 [IU] | Freq: Once | INTRAVENOUS | Status: AC | PRN
  Administered 2014-11-24: 500 [IU]
  Filled 2014-11-24: qty 5

## 2014-11-24 MED ORDER — SODIUM CHLORIDE 0.9 % IJ SOLN
10.0000 mL | INTRAMUSCULAR | Status: DC | PRN
  Administered 2014-11-24: 10 mL
  Filled 2014-11-24: qty 10

## 2014-11-24 MED ORDER — SODIUM CHLORIDE 0.9 % IV SOLN
Freq: Once | INTRAVENOUS | Status: AC
  Administered 2014-11-24: 10:00:00 via INTRAVENOUS

## 2014-11-24 MED ORDER — PEGFILGRASTIM 6 MG/0.6ML ~~LOC~~ PSKT
6.0000 mg | PREFILLED_SYRINGE | Freq: Once | SUBCUTANEOUS | Status: AC
  Administered 2014-11-24: 6 mg via SUBCUTANEOUS
  Filled 2014-11-24: qty 0.6

## 2014-11-24 MED ORDER — SODIUM CHLORIDE 0.9 % IV SOLN
Freq: Once | INTRAVENOUS | Status: AC
  Administered 2014-11-24: 10:00:00 via INTRAVENOUS
  Filled 2014-11-24: qty 4

## 2014-11-24 NOTE — Patient Instructions (Signed)
[facility] Discharge Instructions for Patients Receiving Chemotherapy  Today you received the following chemotherapy agents: Abraxane   To help prevent nausea and vomiting after your treatment, we encourage you to take your nausea medication as directed.    If you develop nausea and vomiting that is not controlled by your nausea medication, call the clinic.   BELOW ARE SYMPTOMS THAT SHOULD BE REPORTED IMMEDIATELY:  *FEVER GREATER THAN 100.5 F  *CHILLS WITH OR WITHOUT FEVER  NAUSEA AND VOMITING THAT IS NOT CONTROLLED WITH YOUR NAUSEA MEDICATION  *UNUSUAL SHORTNESS OF BREATH  *UNUSUAL BRUISING OR BLEEDING  TENDERNESS IN MOUTH AND THROAT WITH OR WITHOUT PRESENCE OF ULCERS  *URINARY PROBLEMS  *BOWEL PROBLEMS  UNUSUAL RASH Items with * indicate a potential emergency and should be followed up as soon as possible.  Feel free to call the clinic you have any questions or concerns. The clinic phone number is [phone].  Please show the CHEMO ALERT CARD at check-in to the Emergency Department and triage nurse.   

## 2014-11-24 NOTE — Progress Notes (Signed)
ID: [name]   DOB: August 03, 1955  MR#: [id number]  IOM#:[id number]  PCP: [name]., NP GYN:  SU: [name] OTHER AG:TXMIWO [name], [name], [name]  CHIEF COMPLAINT:  Estrogen receptor positive Breast Cancer; Ovarian cancer; DVT  CURRENT TREATMENT: Letrozole; abraxane; lovenox   BREAST CANCER HISTORY: From the original consult note:  [name] noted a mass in her right breast mid December 2013, and as it did not spontaneously resolve over a couple of weeks she brought it to her primary physician's attention. She was set up for diagnostic mammography and right breast ultrasonography at the [facility] Center 05/10/2012. (Note that the patient's most recent prior mammography had been in October 2008). The current study showed a spiculated mass in the superior subareolar portion of the right breast measuring approximately 5 cm and associated with pleomorphic calcifications. This was firm and palpable. There was right nipple retraction and skin thickening. Ultrasound confirmed an irregularly marginated hypoechoic mass measuring 3.5 cm by ultrasound, and an abnormal appearing lower right axillary lymph node measuring 2.6 cm.  Biopsies of both the breast mass and the abnormal appearing lymph node were performed 05/21/2012. Both showed an invasive ductal carcinoma, grade 2, with similar prognostic panels (the breast mass was 100% estrogen and 73% progesterone receptor positive, with an MIB-1 of 5%; the lymph node was 100% estrogen 100% progesterone receptor positive, with an MIB-1 of 20%). Both masses were HER-2 negative.  Breast MRI obtained at [name] 05/29/2012 confirmed a dominant mass in the retroareolar right breast measuring 4.4 cm maximally. There was a satellite nodule inferior and lateral to this mass, measuring 1.7 cm. There were no other masses in either breast. Aside from the previously biopsied lymph node there were other mildly  enhancing level I right axillary lymph nodes which did not appear pathologic. There was no other lymphadenopathy noted.   The patient's subsequent history is as detailed below.  OVARIAN CANCER HISTORY: From the 06/16/2014 summary note:  "[name] returns today for review of her restaging studies accompanied by her husband [name]. To summarize her recent history: She was admitted to [name] with a diagnosis of possible pneumonia in late December 2015. Chest x-ray showed some increased density in the right upper lobe. CT scan was suggested and was obtained by Dr. Inda [name]. This showed 2 small nodules in the right middle lobe, measuring 8 and 4 mm respectively. Dr. Inda [name] then contacted [name] for further evaluation and we set [name] up for a PET scan and which was performed late January. This showed the 2 nodules in the lung not to be hypermetabolic. This of course does not prove that they are not malignant. More importantly, there was a 13.7 cm cystic/solid mass in the upper pelvis associated with adenopathy,. All of this was hypermetabolic."  The patient was evaluated by gynecologic oncology and on 06/27/2014 she underwent surgery under [name]. This consisted of an exploratory laparotomy with hysterectomy and abdominal salpingo-oophorectomy, as well as tumor debulking and omentectomy. 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 The tumor measured 11.5 cm, focally involve the ovarian capsule on the left; the uterus and right ovary and fallopian tube were unremarkable. One para-aortic lymph node was positive out of a total of 6 lymph nodes sampled (2 left common iliac, 2 left para-aortic, and to within the omental resection).  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 Today she  is due for cycle 4 of abraxane. She generally tolerates this well with few complaints. She has occasional constipation managed with stool softeners. She has tingling to the tip of the right 5th finger alone and it is stable. Otherwise she denies fevers, chills, nausea, or vomiting. She continues on lovenox with no bleeding or bruising.   REVIEW OF SYSTEMS: A detailed review of systems is otherwise stable, except where noted above.   PAST MEDICAL HISTORY: Past Medical History  Diagnosis Date  . Depression   . DVT (deep venous thrombosis)     hx of on HRT left leg ~2006  . GERD (gastroesophageal reflux disease)   . Hyperlipidemia   . Hypertension   . Hypothyroidism   . PPD positive, treated     rx inh   . Diabetes mellitus   . 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 DJD (degenerative joint disease) of lumbar spine   . Chronic combined systolic and diastolic CHF (congestive heart failure)     a. 08/2012 Echo: EF 55-60%, no rwma, mod MR;  b. 07/2014 Echo: EF 45-50%, distal antsept HK, mod TR/MR, mildly bil-atrial enlargement.  . Paroxysmal atrial fibrillation     a. CHA2DS2VASc = 4-->coumadin.  [name] Physical deconditioning   . Allergy   . Anemia     low iron hx  . Cataract     removed ou  . OSA on CPAP     cpap 4.5 setting  . Breast cancer 2014    a. 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 Clear Cell Ovarian Cancer     a. 06/2014 s/p TAH/BSO debulking/lymph node dissection;  b. Now on chemo.    PAST SURGICAL HISTORY: Past Surgical History  Procedure Laterality Date  . Tubal ligation    . Cholecystectomy    . Foot surgery    . Eye surgery    . Cataract extraction    . Percutaneous liver biopsy    . Breast biopsy      left breast  . Mastectomy modified radical  06/08/2012    Procedure: MASTECTOMY MODIFIED RADICAL;  Surgeon: Rhonda Jolly, MD;   Location: [facility];  Service: General;  Laterality: Right;  . Portacath placement  06/08/2012    Procedure: INSERTION PORT-A-CATH;  Surgeon: Rhonda Jolly, MD;  Location: [facility];  Service: General;  Laterality: Left;  . Peg tube placement      peg removed 2015  . History of chemotherapy x 2 treatments, radiation tx  2014  . Pac removed  2015  . Laparotomy N/A 06/27/2014    Procedure: EXPLORATORY LAPAROTOMY;  Surgeon: Everitt Amber, MD;  Location: [facility];  Service: Gynecology;  Laterality: N/A;  . Abdominal hysterectomy N/A 06/27/2014    Procedure: HYSTERECTOMY ABDOMINAL TOTAL;  Surgeon: Everitt Amber, MD;  Location: [facility];  Service: Gynecology;  Laterality: N/A;  . Salpingoophorectomy Bilateral 06/27/2014    Procedure: BILATERAL SALPINGO OOPHORECTOMY/TUMOR DEBULKING/LYMPHNODE DISSECTION, OMENTECTOMY;  Surgeon: Everitt Amber, MD;  Location: [facility];  Service: Gynecology;  Laterality: Bilateral;    FAMILY HISTORY Family History  Problem Relation Age of Onset  . Diabetes Mother   . Hypertension Mother   . Arthritis Mother   . Heart disease Mother   . Heart failure Mother   .  Other Mother     benign breast mass  . Stroke Father   . Heart disease Father   . Dementia Father   . Diabetes Paternal Grandmother   . Colon cancer Paternal Grandfather     dx. 70s-80s  . Other Daughter     potentially has had negative BRCA testing  . Other Daughter     hysterectomy; potentially has had negative genetic testing   the patient's father died in his 1s with a history of dementia. He had had prior strokes. The patient's mother died in her 55s, with a history of congestive heart failure. [name] had no brothers, one sister. There is no history of breast or ovarian cancer in the family.  GYNECOLOGIC HISTORY: Menarche age 64, first live birth age 71, she is GX P2, menopause approximately 15 years ago, on hormone replacement until 2010.  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 Her husband [name] works for [employer]. Daughter [name] is a Physiological scientist and lives in [place]. Daughter [name] and her family (husband and 2 children aged 1 and 3 years) currently live with the patient. [name] is a member of a [employer].   ADVANCED DIRECTIVES: Not in place  HEALTH MAINTENANCE: (Updated October 2014) History  Substance Use Topics  . Smoking status: Former Smoker -- 1.00 packs/day for 1 years  . Smokeless tobacco: Never Used  . Alcohol Use: No     Colonoscopy: Never  PAP: Does not recall  Bone density: Never  Lipid panel:   Allergies  Allergen Reactions  . Olmesartan Medoxomil Cough    REACTION: ? if cough  . Venlafaxine Other (See Comments)    REACTION: severe dry mouth  . Adhesive [Tape] Rash  . Tetracycline Hcl Other (See Comments)    Unknown reaction, too long for patient to remember     Current Outpatient Prescriptions  Medication Sig Dispense Refill  . ALPRAZolam (XANAX XR) 1 MG 24 hr tablet Take 1 tablet (1 mg total) by mouth 2 (two) times daily. 240 tablet 0  . amoxicillin (AMOXIL) 500 MG capsule Take 1 capsule (500 mg total) by mouth 3 (three) times daily. 30 capsule 0  . buPROPion (WELLBUTRIN XL) 150 MG 24 hr tablet Take 1 tablet (150 mg total) by mouth daily. 90 tablet 0  . clonazePAM (KLONOPIN) 0.5 MG tablet TAKE 1/2 TABLET TWICE DAILY AS NEEDED 30 tablet 0  . docusate sodium (COLACE) 100 MG capsule Take 1 capsule (100 mg total) by mouth 2 (two) times daily. 20 capsule 0  . enoxaparin (LOVENOX) 100 MG/ML injection Inject 1 mL (100 mg total) into the skin daily. 90 Syringe 2  . escitalopram (LEXAPRO) 20 MG tablet Take 20 mg by mouth daily.    . famotidine (PEPCID) 20 MG tablet Take 1 tablet (20 mg total) by mouth daily. 30 tablet 0  . furosemide (LASIX) 20 MG tablet Take 1 tablet (20 mg total) by mouth daily. 90 tablet 1  . letrozole  (FEMARA) 2.5 MG tablet Take 1 tablet (2.5 mg total) by mouth daily. 90 tablet 0  . levothyroxine (SYNTHROID, LEVOTHROID) 150 MCG tablet Take 1 tablet (150 mcg total) by mouth daily. 90 tablet 1  . lidocaine-prilocaine (EMLA) cream Apply over port area 1-2 hours before chemotherapy 30 g 0  . metFORMIN (GLUCOPHAGE) 500 MG tablet Take 500 mg by mouth 2 (two) times daily with  a meal.    . metoprolol (LOPRESSOR) 50 MG tablet Take 1 tablet (50 mg total) by mouth 2 (two) times daily. 180 tablet 1  . Nutritional Supplements (ENSURE HIGH PROTEIN PO) Take 237 mLs by mouth 2 (two) times daily.    [name] OLANZapine (ZYPREXA) 2.5 MG tablet Take 2.5 mg by mouth at bedtime.    . ondansetron (ZOFRAN) 8 MG tablet Take 1 tablet (8 mg total) by mouth 2 (two) times daily. Start the day after chemo for 2 days. Then take as needed for nausea or vomiting. 30 tablet 1  . potassium chloride (K-DUR) 10 MEQ tablet Take 10 mEq by mouth 2 (two) times daily.  3  . prochlorperazine (COMPAZINE) 10 MG tablet Take 1 tablet (10 mg total) by mouth every 6 (six) hours as needed (Nausea or vomiting). 30 tablet 1   No current facility-administered medications for this visit.    OBJECTIVE: Middle-aged white woman who appears stated age 20 Vitals:   11/24/14 0846  BP: 110/62  Pulse:   Temp:   Resp:      Body mass index is 27.11 kg/(m^2).    ECOG FS: 1 Filed Weights   11/24/14 0819  Weight: 158 lb (71.668 kg)   Skin: warm, dry  HEENT: sclerae anicteric, conjunctivae pink, oropharynx clear. No thrush or mucositis.  Lymph Nodes: No cervical or supraclavicular lymphadenopathy  Lungs: clear to auscultation bilaterally, no rales, wheezes, or rhonci  Heart: regular rate and rhythm  Abdomen: round, soft, non tender, positive bowel sounds  Musculoskeletal: No focal spinal tenderness, no peripheral edema  Neuro: non focal, well oriented, positive affect  Breasts: deferred     Lab Results  Component Value Date   WBC 5.3  11/24/2014   NEUTROABS 4.3 11/24/2014   HGB 11.0* 11/24/2014   HCT 34.1* 11/24/2014   MCV 87.5 11/24/2014   PLT 162 11/24/2014      Chemistry      Component Value Date/Time   NA 139 11/24/2014 0749   NA 135 10/02/2014 0415   K 3.9 11/24/2014 0749   K 3.8 10/02/2014 0415   CL 106 10/02/2014 0415   CL 104 10/29/2012 1058   CO2 23 11/24/2014 0749   CO2 20* 10/02/2014 0415   BUN 5.5* 11/24/2014 0749   BUN 6 10/02/2014 0415   CREATININE 0.6 11/24/2014 0749   CREATININE 0.45 10/02/2014 0415   CREATININE 0.69 11/10/2013 0828      Component Value Date/Time   CALCIUM 8.9 11/24/2014 0749   CALCIUM 8.4* 10/02/2014 0415   ALKPHOS 206* 11/24/2014 0749   ALKPHOS 152* 08/18/2014 1320   AST 35* 11/24/2014 0749   AST 40* 08/18/2014 1320   ALT 18 11/24/2014 0749   ALT 20 08/18/2014 1320   BILITOT 0.55 11/24/2014 0749   BILITOT 0.8 08/18/2014 1320     Results for GENEVIENE, TESCH (MRN [id number]) as of 11/24/2014 09:58  Ref. Range 08/11/2014 09:50 09/08/2014 12:36 10/06/2014 07:53 10/27/2014 09:24 11/16/2014 10:54  CA 125 Latest Ref Range: <35 U/mL 544 (H) 213 (H) 113 (H) 123 (H) 86 (H)     STUDIES: Ir Ivc Filter Plmt / S&i /img Guid/mod Sed  10/26/2014   INDICATION: 59 year-old with history of breast cancer and bilateral lower extremity DVT. Recent ultrasound demonstrated mobile thrombus in the right groin. Retrievable IVC filter Rhonda be placed for pulmonary embolism prophylaxis.  EXAM: IVC FILTER PLACEMENT; IVC VENOGRAM; ULTRASOUND FOR VASCULAR ACCESS  Physician: Stephan Minister. Anselm Pancoast, MD  FLUOROSCOPY TIME:  3 minutes and 6 seconds, 101 mGy.  MEDICATIONS: 1 mg Versed, 50 mcg fentanyl.  ANESTHESIA/SEDATION: Moderate sedation time: 24 minutes  CONTRAST:  55 mL Omnipaque 300  PROCEDURE: The procedure was explained to the patient. The risks and benefits of the procedure were discussed and the patient's questions were addressed. Informed consent was obtained from the patient. Ultrasound demonstrated a patent  right internal jugular vein. Ultrasound images were obtained for documentation. The right side of the neck was prepped and draped in a sterile fashion. Maximal barrier sterile technique was utilized including caps, mask, sterile gowns, sterile gloves, sterile drape, hand hygiene and skin antiseptic. The skin was anesthetized with 1% lidocaine. A 21 gauge needle was directed into the vein with ultrasound guidance and a micropuncture dilator set was placed. A wire was advanced into the IVC. The filter sheath was advanced over the wire into the IVC. An IVC venogram was performed. Fluoroscopic images were obtained for documentation. A Bard Denali filter was deployed below the renal veins. A follow-up venogram was performed and the vascular sheath was removed with manual compression.  FINDINGS: IVC was patent. Bilateral renal veins were identified. The IVC filter was deployed in the infrarenal IVC. The IVC just below the renal veins was felt to be slightly large for size. The IVC approximately 1 vertebral body below the renal veins was slightly smaller, measuring roughly 27 mm. This area was felt to be adequate for IVC filter placement. IVC filter was successfully placed in the infrarenal IVC and well positioned on the follow-up images.  Estimated blood loss:  Minimal  COMPLICATIONS: None  IMPRESSION: Successful placement of a retrievable IVC filter.  This IVC filter is potentially retrievable. The patient Rhonda be assessed for filter retrieval by Interventional Radiology in approximately 3 months. Further recommendations regarding filter retrieval, continued surveillance or declaration of device permanence, Rhonda be made at that time.   Electronically Signed   By: Markus Daft M.D.   On: 10/26/2014 17:24   US Venous Img Lower Bilateral  10/25/2014   CLINICAL DATA:  59 year old with bilateral lower extremity DVT. Status post mechanical thrombectomy to the deep vein thrombosis in the left common femoral vein and left  femoral vein. Patient presents for follow-up.  EXAM: BILATERAL LOWER EXTREMITY VENOUS DOPPLER ULTRASOUND  TECHNIQUE: Gray-scale sonography with graded compression, as well as color Doppler and duplex ultrasound were performed to evaluate the lower extremity deep venous systems from the level of the common femoral vein and including the common femoral, femoral, profunda femoral, popliteal and calf veins including the posterior tibial, peroneal and gastrocnemius veins when visible. The superficial great saphenous vein was also interrogated. Spectral Doppler was utilized to evaluate flow at rest and with distal augmentation maneuvers in the common femoral, femoral and popliteal veins.  COMPARISON:  Venous duplex report from [facility] dated 10/01/2014. Images not available.  FINDINGS: RIGHT LOWER EXTREMITY  The proximal right common femoral vein is compressible and patent. There is nonocclusive thrombus in the distal right common femoral vein at the bifurcation of the femoral vein and deep femoral vein. This nonocclusive thrombus is mildly mobile and likely reflects the previously described mobile clot. There is no significant compressibility in the right femoral vein. There is at least partial compressibility of the right posterior tibial veins. The right saphenofemoral junction is patent. Color Doppler flow in the right profunda femoral vein. Small amount of blood flow in the proximal right femoral vein. There is nonocclusive thrombus in the right popliteal  vein. Right great saphenous vein is compressible and patent.  LEFT LOWER EXTREMITY  The proximal left common femoral vein is compressible. Nonocclusive thrombus in the left common femoral vein at the level of the saphenofemoral junction. There is thrombus extending into the proximal left great saphenous vein. The left femoral vein is non compressible. Left popliteal vein is non compressible. There appears to be at least partial compressibility of the left  posterior tibial veins. The proximal left common femoral vein has color Doppler flow. There appears to be nonocclusive thrombus in the proximal left profunda femoral vein. Minimal flow identified in the left femoral vein. Small amount of flow in the left popliteal vein. Although there may be partial compressibility of the left calf veins, there was no significant color Doppler flow. The proximal left great saphenous vein contains thrombus. The great saphenous vein distal to the proximal thigh is compressible and patent.  Other Findings:  None.  IMPRESSION: Persistent bilateral lower extremity deep vein thrombosis. The left lower extremity thrombus extends from the left saphenofemoral junction to the left popliteal vein and difficult to exclude calf thrombus. There is also superficial thrombosis of the proximal left great saphenous vein. The left lower extremity deep vein thrombosis has minimally changed from the previous examination.  Persistent deep vein thrombosis in the right lower extremity extending from the distal right common femoral vein to the right popliteal vein. Difficult to evaluate for right calf thrombus. Again noted is a nonocclusive and mobile clot in the right common femoral vein region which was previously described on the prior examination.   Electronically Signed   By: Markus Daft M.D.   On: 10/25/2014 10:35    ASSESSMENT: 59 y.o. [name]   (1)  status post right mastectomy  06/08/2012 for a pT3, pN1a, stage IIIA invasive ductal carcinoma, grade 2,  estrogen and progesterone receptor positive, HER-2/neu negative, with an MIB-1 of 20%.    (2)  treated in the adjuvant setting with docetaxel/ cyclophosphamide given every 3 weeks. There were multiple and severe complications, and the  patient tolerated only two cycles, last dose 07/29/2012  (3) letrozole started 08/16/2012  (a) osteopenia, with a T score of -1.14 April 2013  (4) postmastectomy radiation completed  12/24/2012  (5)  comorbidities include diabetes, hypertension, chronic liver disease with cirrhosis and fatty liver, and nutritional disturbance with temporarily dependence on PEG feeds (discontinued July 2014).  (6) restaging studies January 2016 show  (a) 8 mm and 4 mm Right middle lobe lung nodules, not metabolically active on PET -- Rhonda require follow-up  (b) hypermetabolic 29.5 cm mixed solid/cystic lesion in upper pelvis, with associated adenopathy  (7) status post exploratory laparotomy 06/27/2014 with total abdominal hysterectomy, bilateral salpingo-oophorectomy, para-aortic lymphadenectomy, omentectomy and radical tumor debulking for a clear cell ovarian cancer, pT1c pN1, stage IIIC .  (8) adjuvant chemotherapy consisting of carboplatin and paclitaxel given weekly days 1 and 8 of each 21 day cycle, for 6-8 cycles as tolerated, started 07/17/2014, with onpro support  (a) day 8 cycle 2 omitted and further treatment delayed because of an episode of Klebsiella sepsis requiring intensive care unit admission  (9) bilateral lower extremity DVTs documented 09/25/2014 despite being on Coumadin for PAF (subtherapeutic INR) with mobile Right common femoral clot  (a) status post thrombolysis 09/30/2014  (b) on Lovenox daily as of 10/02/2014  (c) IVC filter placed 10/26/2014 as mobile clot still noted Right groin  (10) started abraxane 10/13/2014, to be repeated every 14 days  (11)  genetics testing pending   PLAN: Anissa is doing well today. The labs were reviewed in detail and were entirely stable. She Rhonda proceed with cycle 4 of abraxane as planned today.  After 4-6 cycles, depending on the results of the CEA 125, we Rhonda consider restaging studies.  She Rhonda continue to have lab work checked weekly and be treated every 2 weeks. Her husband has made a request to be seen on Mondays in order to be able to alternate visits with myself and Dr. Jana Hakim. I have attempted to accommodate this  request for the month of August at least. She understands and agrees with this plan. She has been encouraged to call with any issues that might arise before her next visit here.  Laurie Panda, NP    11/24/2014

## 2014-11-24 NOTE — Progress Notes (Signed)
Hubby donald called to say that fmla forms are needed for him. I advised 7-10 business days. I called [employer] and jean will fax me the forms to me to be filled out and faxed back [phone]

## 2014-11-24 NOTE — Progress Notes (Signed)
Ok to proceed with chemotherapy with Alkaline Phos: [phone]

## 2014-11-25 LAB — CA 125: CA 125: 93 U/mL — AB (ref <35)

## 2014-11-27 ENCOUNTER — Other Ambulatory Visit: Payer: Self-pay | Admitting: Nurse Practitioner

## 2014-12-01 ENCOUNTER — Ambulatory Visit: Payer: Self-pay | Admitting: Family

## 2014-12-01 ENCOUNTER — Encounter: Payer: Self-pay | Admitting: Oncology

## 2014-12-01 NOTE — Progress Notes (Signed)
I spoke with hubby to advise Romie Minus never sent forms to me. See prev notes. I gave him my ph# and fax# to have her fax to me.

## 2014-12-04 ENCOUNTER — Other Ambulatory Visit: Payer: Self-pay

## 2014-12-06 ENCOUNTER — Telehealth: Payer: Self-pay | Admitting: Genetic Counselor

## 2014-12-06 ENCOUNTER — Encounter: Payer: Self-pay | Admitting: Oncology

## 2014-12-06 ENCOUNTER — Ambulatory Visit: Payer: Self-pay | Admitting: Genetic Counselor

## 2014-12-06 DIAGNOSIS — Z7189 Other specified counseling: Secondary | ICD-10-CM | POA: Insufficient documentation

## 2014-12-06 DIAGNOSIS — Z1379 Encounter for other screening for genetic and chromosomal anomalies: Secondary | ICD-10-CM

## 2014-12-06 DIAGNOSIS — Z800 Family history of malignant neoplasm of digestive organs: Secondary | ICD-10-CM

## 2014-12-06 DIAGNOSIS — C569 Malignant neoplasm of unspecified ovary: Secondary | ICD-10-CM

## 2014-12-06 DIAGNOSIS — Z853 Personal history of malignant neoplasm of breast: Secondary | ICD-10-CM | POA: Insufficient documentation

## 2014-12-06 NOTE — Progress Notes (Signed)
GENETIC TEST RESULTS  HPI: Ms. Oncale was previously seen in the [place] clinic due to a personal history of breast and clear cell ovarian cancer, family history of colon cancer in her paternal grandfather, and concerns regarding a hereditary predisposition to cancer. Please refer to our prior cancer genetics clinic note from November 16, 2014 for more information regarding Ms. Schollmeyer's medical, social and family histories, and our assessment and recommendations, at the time. Ms. Ostermann recent genetic test results were disclosed to her, as were recommendations warranted by these results. These results and recommendations are discussed in more detail below.  GENETIC TEST RESULTS: At the time of Ms. Conrad's visit on 11/16/14, we recommended she pursue genetic testing of the 21-gene Breast/Ovarian Cancer Panel through [facility]. The Breast/Ovarian gene panel offered by [facility] includes sequencing and deletion/duplication analysis for the following 20 genes:  ATM, BARD1, BRCA1, BRCA2, BRIP1, CDH1, CHEK2, FANCC, MLH1, MSH2, MSH6, NBN, PALB2, PMS2, PTEN, RAD51C, RAD51D, STK11, TP53, and XRCC2. The panel also includes deletion/duplication analysis (without sequencing) for one gene, EPCAM. Those results are now back, the report date for which is December 04, 2014.  Genetic testing was normal, and did not reveal a deleterious mutation in these genes.  Additionally, no variants of uncertain significance (VUSs) were found.  The test report will be scanned into EPIC and will be located under the Media tab.   We discussed with Ms. Beaumont that since the current genetic testing is not perfect, it is possible there may be a gene mutation in one of these genes that current testing cannot detect, but that chance is small. We also discussed, that it is possible that another gene that has not yet been discovered, or that we have not yet tested, is responsible for the cancer diagnoses in the family, and it  is, therefore, important to remain in touch with cancer genetics in the future so that we can continue to offer Ms. Heuer the most up to date genetic testing.   CANCER SCREENING RECOMMENDATIONS: While we still do not have an explanation for Ms. Doscher's personal history of breast and ovarian cancer, this result is reassuring and indicates that Ms. Gillaspie likely does not have an increased risk for a future cancer due to a mutation in one of these genes. This normal test also suggests that Ms. Chenault's cancer was most likely not due to an inherited predisposition associated with one of these genes.  Most cancers happen by chance and this negative test suggests that her cancer falls into this category.  We, therefore, recommended she continue to follow the cancer management and screening guidelines provided by her oncology and primary healthcare providers.   RECOMMENDATIONS FOR FAMILY MEMBERS: Women in this family might be at some increased risk of developing cancer, over the general population risk, simply due to the family history of cancer. We recommended women in this family have a yearly mammogram beginning at age 49, or 69 years younger than the earliest onset of cancer, an an annual clinical breast exam, and perform monthly breast self-exams. This means that Ms. Brim's oldest daughter should currently be having annual mammograms, and her youngest daughter should begin mammograms when she reaches the age of 23.  Women in this family should also have a gynecological exam as recommended by their primary provider. All family members should have a colonoscopy by age 28.  Additionally, Ms. Shrider had previously reported that both of her daughters have already had negative "BRCA testing".  FOLLOW-UP: Lastly, we discussed with Ms. Cowgill that cancer genetics is a rapidly advancing field and it is possible that new genetic tests will be appropriate for her and/or her family members in the future. We encouraged  her to remain in contact with cancer genetics on an annual basis so we can update her personal and family histories and let her know of advances in cancer genetics that may benefit this family.   Our contact number was provided. Ms. Kiernan questions were answered to her satisfaction, and she knows she is welcome to call us at anytime with additional questions or concerns.   Jeanine Luz, MS Genetic Counselor [email or web address] Phone: [phone]

## 2014-12-06 NOTE — Progress Notes (Signed)
I placed fmla forms for hubby donald on desk of nurse for dr. Jana Hakim

## 2014-12-06 NOTE — Telephone Encounter (Signed)
Discussed with [name]'s husband that her genetic testing was negative for changes within any of 21 genes associated with increased risks for breast, ovarian, or other related cancers.  Additionally, no variants of uncertain significance (VUSs) were found.  We still do not have an explanation for the personal or family history of cancer, but most cancers are not genetic.  Future cancer screening will be based upon the personal and family history of cancer.  [name] oldest daughter should be having mammograms already; her younger daughter will need to begin having those at the age of 35.  [name] is always welcome to check back with [name] in the future to see if there is any updated/more comprehensive testing options available at that time.  She or her husband should call us with any further questions they might have.

## 2014-12-07 ENCOUNTER — Encounter: Payer: Self-pay | Admitting: Oncology

## 2014-12-07 NOTE — Progress Notes (Signed)
I called and let mr. [name] know the forms on desk of dr and he should have them signed by Monday. I will leave copy for them at front with ms. wilma

## 2014-12-08 ENCOUNTER — Ambulatory Visit: Payer: Self-pay | Admitting: Physician Assistant

## 2014-12-08 ENCOUNTER — Ambulatory Visit: Payer: Self-pay

## 2014-12-08 ENCOUNTER — Other Ambulatory Visit: Payer: Self-pay

## 2014-12-11 ENCOUNTER — Ambulatory Visit (HOSPITAL_BASED_OUTPATIENT_CLINIC_OR_DEPARTMENT_OTHER): Payer: BLUE CROSS/BLUE SHIELD

## 2014-12-11 ENCOUNTER — Encounter: Payer: Self-pay | Admitting: *Deleted

## 2014-12-11 ENCOUNTER — Other Ambulatory Visit (HOSPITAL_BASED_OUTPATIENT_CLINIC_OR_DEPARTMENT_OTHER): Payer: BLUE CROSS/BLUE SHIELD

## 2014-12-11 ENCOUNTER — Ambulatory Visit (HOSPITAL_BASED_OUTPATIENT_CLINIC_OR_DEPARTMENT_OTHER): Payer: BLUE CROSS/BLUE SHIELD | Admitting: Oncology

## 2014-12-11 VITALS — BP 121/61 | HR 59 | Temp 97.9°F | Resp 18 | Ht 64.0 in | Wt 158.2 lb

## 2014-12-11 DIAGNOSIS — I82409 Acute embolism and thrombosis of unspecified deep veins of unspecified lower extremity: Secondary | ICD-10-CM

## 2014-12-11 DIAGNOSIS — C50911 Malignant neoplasm of unspecified site of right female breast: Secondary | ICD-10-CM | POA: Diagnosis not present

## 2014-12-11 DIAGNOSIS — C569 Malignant neoplasm of unspecified ovary: Secondary | ICD-10-CM

## 2014-12-11 DIAGNOSIS — Z79811 Long term (current) use of aromatase inhibitors: Secondary | ICD-10-CM | POA: Diagnosis not present

## 2014-12-11 DIAGNOSIS — Z5189 Encounter for other specified aftercare: Secondary | ICD-10-CM | POA: Diagnosis not present

## 2014-12-11 DIAGNOSIS — Z86718 Personal history of other venous thrombosis and embolism: Secondary | ICD-10-CM

## 2014-12-11 DIAGNOSIS — Z5111 Encounter for antineoplastic chemotherapy: Secondary | ICD-10-CM

## 2014-12-11 LAB — COMPREHENSIVE METABOLIC PANEL (CC13)
ALBUMIN: 2.8 g/dL — AB (ref 3.5–5.0)
ALK PHOS: 166 U/L — AB (ref 40–150)
ALT: 16 U/L (ref 0–55)
AST: 34 U/L (ref 5–34)
Anion Gap: 6 mEq/L (ref 3–11)
BUN: 6.7 mg/dL — ABNORMAL LOW (ref 7.0–26.0)
CO2: 25 mEq/L (ref 22–29)
Calcium: 8.5 mg/dL (ref 8.4–10.4)
Chloride: 110 mEq/L — ABNORMAL HIGH (ref 98–109)
Creatinine: 0.5 mg/dL — ABNORMAL LOW (ref 0.6–1.1)
EGFR: >90 mL/min/{1.73_m2} (ref >90)
GLUCOSE: 94 mg/dL (ref 70–140)
POTASSIUM: 4 meq/L (ref 3.5–5.1)
Sodium: 141 mEq/L (ref 136–145)
Total Bilirubin: 0.76 mg/dL (ref 0.20–1.20)
Total Protein: 5.8 g/dL — ABNORMAL LOW (ref 6.4–8.3)

## 2014-12-11 LAB — CBC WITH DIFFERENTIAL/PLATELET
BASO%: 0.7 % (ref 0.0–2.0)
Basophils Absolute: 0 10*3/uL (ref 0.0–0.1)
EOS%: 1.6 % (ref 0.0–7.0)
Eosinophils Absolute: 0.1 10*3/uL (ref 0.0–0.5)
HCT: 31.9 % — ABNORMAL LOW (ref 34.8–46.6)
HEMOGLOBIN: 10.2 g/dL — AB (ref 11.6–15.9)
LYMPH#: 0.5 10*3/uL — AB (ref 0.9–3.3)
LYMPH%: 14.2 % (ref 14.0–49.7)
MCH: 27.7 pg (ref 25.1–34.0)
MCHC: 31.9 g/dL (ref 31.5–36.0)
MCV: 86.8 fL (ref 79.5–101.0)
MONO#: 0.4 10*3/uL (ref 0.1–0.9)
MONO%: 10.2 % (ref 0.0–14.0)
NEUT%: 73.3 % (ref 38.4–76.8)
NEUTROS ABS: 2.8 10*3/uL (ref 1.5–6.5)
PLATELETS: 154 10*3/uL (ref 145–400)
RBC: 3.68 10*6/uL — ABNORMAL LOW (ref 3.70–5.45)
RDW: 19.5 % — AB (ref 11.2–14.5)
WBC: 3.8 10*3/uL — ABNORMAL LOW (ref 3.9–10.3)

## 2014-12-11 MED ORDER — HEPARIN SOD (PORK) LOCK FLUSH 100 UNIT/ML IV SOLN
500.0000 [IU] | Freq: Once | INTRAVENOUS | Status: AC | PRN
  Administered 2014-12-11: 500 [IU]
  Filled 2014-12-11: qty 5

## 2014-12-11 MED ORDER — SODIUM CHLORIDE 0.9 % IV SOLN
Freq: Once | INTRAVENOUS | Status: AC
  Administered 2014-12-11: 13:00:00 via INTRAVENOUS
  Filled 2014-12-11: qty 4

## 2014-12-11 MED ORDER — SODIUM CHLORIDE 0.9 % IJ SOLN
10.0000 mL | INTRAMUSCULAR | Status: DC | PRN
  Administered 2014-12-11: 10 mL
  Filled 2014-12-11: qty 10

## 2014-12-11 MED ORDER — SODIUM CHLORIDE 0.9 % IV SOLN
Freq: Once | INTRAVENOUS | Status: AC
  Administered 2014-12-11: 12:00:00 via INTRAVENOUS

## 2014-12-11 MED ORDER — PACLITAXEL PROTEIN-BOUND CHEMO INJECTION 100 MG
100.0000 mg/m2 | Freq: Once | INTRAVENOUS | Status: AC
  Administered 2014-12-11: 175 mg via INTRAVENOUS
  Filled 2014-12-11: qty 35

## 2014-12-11 MED ORDER — PEGFILGRASTIM 6 MG/0.6ML ~~LOC~~ PSKT
6.0000 mg | PREFILLED_SYRINGE | Freq: Once | SUBCUTANEOUS | Status: AC
  Administered 2014-12-11: 6 mg via SUBCUTANEOUS
  Filled 2014-12-11: qty 0.6

## 2014-12-11 NOTE — Progress Notes (Signed)
ID: [name]   DOB: 12/10/1955  MR#: [id number]  YME#:[id number]  PCP: [name], NP GYN:  SU: [facility] OTHER WK:GSUPJS [name], Shanon Ace, Roney Jaffe  CHIEF COMPLAINT:  Estrogen receptor positive Breast Cancer; Ovarian cancer; DVT  CURRENT TREATMENT: Letrozole; abraxane; lovenox   BREAST CANCER HISTORY: From the original consult note:  [name] noted a mass in her right breast mid December 2013, and as it did not spontaneously resolve over a couple of weeks she brought it to her primary physician's attention. She was set up for diagnostic mammography and right breast ultrasonography at the breast Center 05/10/2012. (Note that the patient's most recent prior mammography had been in October 2008). The current study showed a spiculated mass in the superior subareolar portion of the right breast measuring approximately 5 cm and associated with pleomorphic calcifications. This was firm and palpable. There was right nipple retraction and skin thickening. Ultrasound confirmed an irregularly marginated hypoechoic mass measuring 3.5 cm by ultrasound, and an abnormal appearing lower right axillary lymph node measuring 2.6 cm.  Biopsies of both the breast mass and the abnormal appearing lymph node were performed 05/21/2012. Both showed an invasive ductal carcinoma, grade 2, with similar prognostic panels (the breast mass was 100% estrogen and 73% progesterone receptor positive, with an MIB-1 of 5%; the lymph node was 100% estrogen 100% progesterone receptor positive, with an MIB-1 of 20%). Both masses were HER-2 negative.  Breast MRI obtained at [facility] imaging 05/29/2012 confirmed a dominant mass in the retroareolar right breast measuring 4.4 cm maximally. There was a satellite nodule inferior and lateral to this mass, measuring 1.7 cm. There were no other masses in either breast. Aside from the previously biopsied lymph node there were other mildly  enhancing level I right axillary lymph nodes which did not appear pathologic. There was no other lymphadenopathy noted.   The patient's subsequent history is as detailed below.  OVARIAN CANCER HISTORY: From the 06/16/2014 summary note:  "[name] returns today for review of her restaging studies accompanied by her husband [name]. To summarize her recent history: She was admitted to [facility] with a diagnosis of possible pneumonia in late December 2015. Chest x-ray showed some increased density in the right upper lobe. CT scan was suggested and was obtained by Dr. Inda Castle. This showed 2 small nodules in the right middle lobe, measuring 8 and 4 mm respectively. Dr. Inda Castle then contacted [name] for further evaluation and we set [place] up for a PET scan and which was performed late January. This showed the 2 nodules in the lung not to be hypermetabolic. This of course does not prove that they are not malignant. More importantly, there was a 13.7 cm cystic/solid mass in the upper pelvis associated with adenopathy,. All of this was hypermetabolic."  The patient was evaluated by gynecologic oncology and on 06/27/2014 she underwent surgery under Dr. Denman George. This consisted of an exploratory laparotomy with hysterectomy and abdominal salpingo-oophorectomy, as well as tumor debulking and omentectomy. 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 The tumor measured 11.5 cm, focally involve the ovarian capsule on the left; the uterus and right ovary and fallopian tube were unremarkable. One para-aortic lymph node was positive out of a total of 6 lymph nodes sampled (2 left common iliac, 2 left para-aortic, and to within the omental resection).  Her subsequent history is as detailed below   INTERVAL HISTORY:  Terrance returns today for follow-up of her clear cell ovarian carcinoma, accompanied by her husband [name]. Today is  day 1 cycle 5 (5th dose) of 4 planned cycles of Abraxane given days 1 and 15 each 28 day cycle. 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 She has Ensure couple of times a day. She feels she is eating "a lot". [name] tells me her diet isn't the greatest but he is pleased that her weight is stable. She complains of problems with her gums and needs some dental work. She is anxious and depressed. She is tolerating the Lovenox with no bleeding. A detailed review of systems today was otherwise stable  PAST MEDICAL HISTORY: Past Medical History  Diagnosis Date  . Depression   . DVT (deep venous thrombosis)     hx of on HRT left leg ~2006  . GERD (gastroesophageal reflux disease)   . Hyperlipidemia   . Hypertension   . Hypothyroidism   . PPD positive, treated     rx inh   . Diabetes mellitus   . 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 DJD (degenerative joint disease) of lumbar spine   . Chronic combined systolic and diastolic CHF (congestive heart failure)     a. 08/2012 Echo: EF 55-60%, no rwma, mod MR;  b. 07/2014 Echo: EF 45-50%, distal antsept HK, mod TR/MR, mildly bil-atrial enlargement.  . Paroxysmal atrial fibrillation     a. CHA2DS2VASc = 4-->coumadin.  [name] Physical deconditioning   . Allergy   . Anemia     low iron hx  . Cataract     removed ou  . OSA on CPAP     cpap 4.5 setting  . Breast cancer 2014    a. 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 Clear Cell Ovarian Cancer     a. 06/2014 s/p TAH/BSO debulking/lymph node dissection;  b. Now on chemo.    PAST SURGICAL HISTORY: Past Surgical History  Procedure Laterality Date  . Tubal ligation    . Cholecystectomy    . Foot surgery     . Eye surgery    . Cataract extraction    . Percutaneous liver biopsy    . Breast biopsy      left breast  . Mastectomy modified radical  06/08/2012    Procedure: MASTECTOMY MODIFIED RADICAL;  Surgeon: Edward Jolly, MD;  Location: [facility];  Service: General;  Laterality: Right;  . Portacath placement  06/08/2012    Procedure: INSERTION PORT-A-CATH;  Surgeon: Edward Jolly, MD;  Location: [facility];  Service: General;  Laterality: Left;  . Peg tube placement      peg removed 2015  . History of chemotherapy x 2 treatments, radiation tx  2014  . Pac removed  2015  . Laparotomy N/A 06/27/2014    Procedure: EXPLORATORY LAPAROTOMY;  Surgeon: Everitt Amber, MD;  Location: [facility];  Service: Gynecology;  Laterality: N/A;  . Abdominal hysterectomy N/A 06/27/2014    Procedure: HYSTERECTOMY ABDOMINAL TOTAL;  Surgeon: Everitt Amber, MD;  Location: [facility];  Service: Gynecology;  Laterality: N/A;  . Salpingoophorectomy Bilateral 06/27/2014    Procedure: BILATERAL SALPINGO OOPHORECTOMY/TUMOR DEBULKING/LYMPHNODE DISSECTION, OMENTECTOMY;  Surgeon: Everitt Amber, MD;  Location:  [facility];  Service: Gynecology;  Laterality: Bilateral;    FAMILY HISTORY Family History  Problem Relation Age of Onset  . Diabetes Mother   . Hypertension Mother   . Arthritis Mother   . Heart disease Mother   . Heart failure Mother   . Other Mother     benign breast mass  . Stroke Father   . Heart disease Father   . Dementia Father   . Diabetes Paternal Grandmother   . Colon cancer Paternal Grandfather     dx. 70s-80s  . Other Daughter     potentially has had negative BRCA testing  . Other Daughter     hysterectomy; potentially has had negative genetic testing   the patient's father died in his 38s with a history of dementia. He had had prior strokes. The patient's mother died in her 76s, with a history of congestive heart failure. Jayce had no brothers, one sister. There is no history of breast or ovarian cancer in the  family.  GYNECOLOGIC HISTORY: Menarche age 42, first live birth age 14, she is GX P2, menopause approximately 15 years ago, on hormone replacement until 2010.  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 Her husband [name] works for [employer]. Daughter Paul Half is a Physiological scientist and lives in [place]. Daughter Santiago Bur and her family (husband and 2 children aged 57 and 3 years) currently live with the patient. Damaya is a member of a [employer].   ADVANCED DIRECTIVES: Not in place  HEALTH MAINTENANCE: (Updated October 2014) History  Substance Use Topics  . Smoking status: Former Smoker -- 1.00 packs/day for 1 years  . Smokeless tobacco: Never Used  . Alcohol Use: No     Colonoscopy: Never  PAP: Does not recall  Bone density: Never  Lipid panel:   Allergies  Allergen Reactions  . Olmesartan Medoxomil Cough    REACTION: ? if cough  . Venlafaxine Other (See Comments)    REACTION: severe dry mouth  . Adhesive [Tape] Rash  . Tetracycline Hcl Other (See Comments)    Unknown reaction, too long for patient to remember     Current Outpatient Prescriptions  Medication Sig Dispense Refill  . ALPRAZolam (XANAX XR) 1 MG 24 hr tablet Take 1 tablet (1 mg total) by mouth 2 (two) times daily. 240 tablet 0  . amoxicillin (AMOXIL) 500 MG capsule Take 1 capsule (500 mg total) by mouth 3 (three) times daily. 30 capsule 0  . buPROPion (WELLBUTRIN XL) 150 MG 24 hr tablet Take 1 tablet (150 mg total) by mouth daily. 90 tablet 0  . clonazePAM (KLONOPIN) 0.5 MG tablet TAKE 1/2 TABLET TWICE DAILY AS NEEDED 30 tablet 0  . docusate sodium (COLACE) 100 MG capsule Take 1 capsule (100 mg total) by mouth 2 (two) times daily. 20 capsule 0  . enoxaparin (LOVENOX) 100 MG/ML injection Inject 1 mL (100 mg total) into the skin daily. 90 Syringe 2  . escitalopram (LEXAPRO) 20 MG  tablet Take 20 mg by mouth daily.    . famotidine (PEPCID) 20 MG tablet Take 1 tablet (20 mg total) by mouth daily. 30 tablet 0  . furosemide (LASIX) 20 MG tablet Take 1 tablet (20 mg total) by mouth daily. 90 tablet 1  . letrozole (FEMARA) 2.5 MG tablet Take 1 tablet (2.5 mg total) by mouth daily. 90 tablet 0  . levothyroxine (  SYNTHROID, LEVOTHROID) 150 MCG tablet Take 1 tablet (150 mcg total) by mouth daily. 90 tablet 1  . lidocaine-prilocaine (EMLA) cream Apply over port area 1-2 hours before chemotherapy 30 g 0  . metFORMIN (GLUCOPHAGE) 500 MG tablet Take 500 mg by mouth 2 (two) times daily with a meal.    . metoprolol (LOPRESSOR) 50 MG tablet Take 1 tablet (50 mg total) by mouth 2 (two) times daily. 180 tablet 1  . Nutritional Supplements (ENSURE HIGH PROTEIN PO) Take 237 mLs by mouth 2 (two) times daily.    [name] OLANZapine (ZYPREXA) 2.5 MG tablet Take 2.5 mg by mouth at bedtime.    . ondansetron (ZOFRAN) 8 MG tablet Take 1 tablet (8 mg total) by mouth 2 (two) times daily. Start the day after chemo for 2 days. Then take as needed for nausea or vomiting. 30 tablet 1  . potassium chloride (K-DUR) 10 MEQ tablet Take 10 mEq by mouth 2 (two) times daily.  3  . prochlorperazine (COMPAZINE) 10 MG tablet Take 1 tablet (10 mg total) by mouth every 6 (six) hours as needed (Nausea or vomiting). 30 tablet 1   No current facility-administered medications for this visit.    OBJECTIVE: Middle-aged white woman in no acute distress Filed Vitals:   12/11/14 0957  BP: 121/61  Pulse: 59  Temp: 97.9 F (36.6 C)  Resp: 18     Body mass index is 27.14 kg/(m^2).    ECOG FS: 1 Filed Weights   12/11/14 0957  Weight: 158 lb 3.2 oz (71.759 kg)   Sclerae unicteric, pupils round and equal Oropharynx clear and moist-- no thrush or other lesions No cervical or supraclavicular adenopathy Lungs no rales or rhonchi Heart regular rate and rhythm Abd soft, nontender, positive bowel sounds, no masses palpated MSK no  focal spinal tenderness, no upper extremity lymphedema Neuro: nonfocal, well oriented, appropriate affect Breasts: Deferred       Lab Results  Component Value Date   WBC 3.8* 12/11/2014   NEUTROABS 2.8 12/11/2014   HGB 10.2* 12/11/2014   HCT 31.9* 12/11/2014   MCV 86.8 12/11/2014   PLT 154 12/11/2014      Chemistry      Component Value Date/Time   NA 141 12/11/2014 0926   NA 135 10/02/2014 0415   K 4.0 12/11/2014 0926   K 3.8 10/02/2014 0415   CL 106 10/02/2014 0415   CL 104 10/29/2012 1058   CO2 25 12/11/2014 0926   CO2 20* 10/02/2014 0415   BUN 6.7* 12/11/2014 0926   BUN 6 10/02/2014 0415   CREATININE 0.5* 12/11/2014 0926   CREATININE 0.45 10/02/2014 0415   CREATININE 0.69 11/10/2013 0828      Component Value Date/Time   CALCIUM 8.5 12/11/2014 0926   CALCIUM 8.4* 10/02/2014 0415   ALKPHOS 166* 12/11/2014 0926   ALKPHOS 152* 08/18/2014 1320   AST 34 12/11/2014 0926   AST 40* 08/18/2014 1320   ALT 16 12/11/2014 0926   ALT 20 08/18/2014 1320   BILITOT 0.76 12/11/2014 0926   BILITOT 0.8 08/18/2014 1320      Results for RUNETTE, SCIFRES (MRN [id number]) as of 12/11/2014 10:06  Ref. Range 09/08/2014 12:36 10/06/2014 07:53 10/27/2014 09:24 11/16/2014 10:54 11/24/2014 07:49  CA 125 Latest Ref Range: <35 U/mL 213 (H) 113 (H) 123 (H) 86 (H) 93 (H)    STUDIES: No results found.  ASSESSMENT: 59 y.o.  BRCA negative [name]   (1)  status post right mastectomy  06/08/2012  for a pT3, pN1a, stage IIIA invasive ductal carcinoma, grade 2,  estrogen and progesterone receptor positive, HER-2/neu negative, with an MIB-1 of 20%.    (2)  treated in the adjuvant setting with docetaxel/ cyclophosphamide given every 3 weeks. There were multiple and severe complications, and the  patient tolerated only two cycles, last dose 07/29/2012  (3) letrozole started 08/16/2012  (a) osteopenia, with a T score of -1.14 April 2013  (4) postmastectomy radiation completed  12/24/2012  (5)  comorbidities include diabetes, hypertension, chronic liver disease with cirrhosis and fatty liver, and nutritional disturbance with temporarily dependence on PEG feeds (discontinued July 2014).  (6) restaging studies January 2016 show  (a) 8 mm and 4 mm Right middle lobe lung nodules, not metabolically active on PET -- Rhonda require follow-up  (b) hypermetabolic 00.3 cm mixed solid/cystic lesion in upper pelvis, with associated adenopathy  (7) status post exploratory laparotomy 06/27/2014 with total abdominal hysterectomy, bilateral salpingo-oophorectomy, para-aortic lymphadenectomy, omentectomy and radical tumor debulking for a clear cell ovarian cancer, pT1c pN1, stage IIIC .  (8) adjuvant chemotherapy consisting of carboplatin and paclitaxel given weekly days 1 and 8 of each 21 day cycle, for 6-8 cycles as tolerated, started 07/17/2014, with onpro support  (a) day 8 cycle 2 omitted and further treatments cancelled because of an episode of Klebsiella sepsis requiring intensive care unit admission  (9) bilateral lower extremity DVTs documented 09/25/2014 despite being on Coumadin for PAF (subtherapeutic INR) with mobile Right common femoral clot  (a) status post thrombolysis 09/30/2014  (b) on Lovenox daily as of 10/02/2014  (c) IVC filter placed 10/26/2014 as mobile clot still noted Right groin  (10) started abraxane 10/13/2014, to be repeated every 14 days  (11) genetics testing December 04, 2014 [facility] no deleterious mutations in ATM, BARD1, BRCA1, BRCA2, BRIP1, CDH1, CHEK2, FANCC, MLH1, MSH2, MSH6, NBN, PALB2, PMS2, PTEN, RAD51C, RAD51D, STK11, TP53, and XRCC2.     PLAN: Flossie continues to tolerate the Abraxane well and we are going to proceed with cycle 5. The goal is 8 cycles and we Rhonda restage at that point.  After she sees me late this month I Rhonda set her up for a repeat Doppler and if her clot  appears stable or absent a we Rhonda remove her IVC filter in September.  I reviewed her genetic studies, which were favorable  Today I gave [name] [employer] a papers for another month. He tells me he is about to lose his job because he can only work part-time. This is through radiation. He hopes to be able to receive disability.  Jahnavi knows to call for any problems that may develop before her next visit here. Chauncey Cruel, MD    12/11/2014

## 2014-12-11 NOTE — Patient Instructions (Signed)
[facility] Discharge Instructions for Patients Receiving Chemotherapy  Today you received the following chemotherapy agents:  Abraxane  To help prevent nausea and vomiting after your treatment, we encourage you to take your nausea medication as ordered per MD.   If you develop nausea and vomiting that is not controlled by your nausea medication, call the clinic.   BELOW ARE SYMPTOMS THAT SHOULD BE REPORTED IMMEDIATELY:  *FEVER GREATER THAN 100.5 F  *CHILLS WITH OR WITHOUT FEVER  NAUSEA AND VOMITING THAT IS NOT CONTROLLED WITH YOUR NAUSEA MEDICATION  *UNUSUAL SHORTNESS OF BREATH  *UNUSUAL BRUISING OR BLEEDING  TENDERNESS IN MOUTH AND THROAT WITH OR WITHOUT PRESENCE OF ULCERS  *URINARY PROBLEMS  *BOWEL PROBLEMS  UNUSUAL RASH Items with * indicate a potential emergency and should be followed up as soon as possible.  Feel free to call the clinic you have any questions or concerns. The clinic phone number is [phone].  Please show the CHEMO ALERT CARD at check-in to the Emergency Department and triage nurse.   

## 2014-12-11 NOTE — Progress Notes (Signed)
This RN faxed FMLA paperwork to [employer]. Fax # [phone]. Original paperwork given to patient and husband during today's office visit.

## 2014-12-15 ENCOUNTER — Encounter: Payer: Self-pay | Admitting: Family

## 2014-12-15 ENCOUNTER — Ambulatory Visit (INDEPENDENT_AMBULATORY_CARE_PROVIDER_SITE_OTHER): Payer: BLUE CROSS/BLUE SHIELD | Admitting: Family

## 2014-12-15 VITALS — BP 110/58 | HR 82 | Temp 98.0°F | Resp 16 | Ht 64.0 in | Wt 157.0 lb

## 2014-12-15 DIAGNOSIS — F329 Major depressive disorder, single episode, unspecified: Secondary | ICD-10-CM

## 2014-12-15 DIAGNOSIS — E119 Type 2 diabetes mellitus without complications: Secondary | ICD-10-CM | POA: Diagnosis not present

## 2014-12-15 DIAGNOSIS — F32A Depression, unspecified: Secondary | ICD-10-CM

## 2014-12-15 DIAGNOSIS — R634 Abnormal weight loss: Secondary | ICD-10-CM | POA: Diagnosis not present

## 2014-12-15 LAB — BASIC METABOLIC PANEL
BUN: 10 mg/dL (ref 6–23)
CALCIUM: 8.5 mg/dL (ref 8.4–10.5)
CHLORIDE: 103 meq/L (ref 96–112)
CO2: 28 mEq/L (ref 19–32)
CREATININE: 0.4 mg/dL (ref 0.40–1.20)
GFR: 172.59 mL/min (ref >60.00)
GLUCOSE: 73 mg/dL (ref 70–99)
Potassium: 3.8 mEq/L (ref 3.5–5.1)
Sodium: 137 mEq/L (ref 135–145)

## 2014-12-15 LAB — MICROALBUMIN / CREATININE URINE RATIO
CREATININE, U: 57.8 mg/dL
Microalb Creat Ratio: 10 mg/g (ref 0.0–30.0)
Microalb, Ur: 5.8 mg/dL — ABNORMAL HIGH (ref 0.0–1.9)

## 2014-12-15 LAB — HEMOGLOBIN A1C: Hgb A1c MFr Bld: 5.2 % (ref 4.6–6.5)

## 2014-12-15 MED ORDER — LEVOTHYROXINE SODIUM 150 MCG PO TABS: 150.0000 ug | ORAL_TABLET | Freq: Every day | ORAL | Status: DC

## 2014-12-15 MED ORDER — METFORMIN HCL 500 MG PO TABS: 500.0000 mg | ORAL_TABLET | Freq: Two times a day (BID) | ORAL | Status: DC

## 2014-12-15 MED ORDER — FUROSEMIDE 20 MG PO TABS: 20.0000 mg | ORAL_TABLET | Freq: Every day | ORAL | Status: DC

## 2014-12-15 NOTE — Assessment & Plan Note (Signed)
Stable.  Continue ensure, advised pt to continue to monitor weights at home and let me know if she experiences weight loss.

## 2014-12-15 NOTE — Assessment & Plan Note (Signed)
Stable. Obtain a1c, urine micro. If A1C is <6, will plan trial off of metformin.

## 2014-12-15 NOTE — Assessment & Plan Note (Signed)
Stable on zyprexa and lovenox.

## 2014-12-15 NOTE — Patient Instructions (Signed)
Please complete lab work prior to leaving. Continue ensure twice daily.

## 2014-12-15 NOTE — Progress Notes (Signed)
Pre visit review using our clinic review tool, if applicable. No additional management support is needed unless otherwise documented below in the visit note. 

## 2014-12-15 NOTE — Progress Notes (Signed)
Subjective:    Patient ID: [name], female    DOB: Mar 27, 1956, 59 y.o.   MRN: [id number]  HPI  [name] is a 59 yr old female who presents for follow up.  1) Loss of weight- Reports that she continues ensure bid. Appetite comes/goes.  1` Wt Readings from Last 3 Encounters:  12/15/14 157 lb (71.215 kg)  12/11/14 158 lb 3.2 oz (71.759 kg)  11/24/14 158 lb (71.668 kg)   Lab Results  Component Value Date   TSH 1.85 11/14/2014    2)  DM2- She continues metformin.  Not checking sugars at home.   Lab Results  Component Value Date   HGBA1C 5.6 04/26/2014   HGBA1C 6.8* 01/24/2014   HGBA1C 9.9* 11/01/2013   Lab Results  Component Value Date   MICROALBUR 1.56 07/25/2013   LDLCALC 92 09/15/2014   CREATININE 0.5* 12/11/2014   3) Depression- reports that she is she has been quieter.  Denies tearfulness, reports that she is able to get up and out of bed in the mornings without difficulty.   Review of Systems    see HPI  Past Medical History  Diagnosis Date  . Depression   . DVT (deep venous thrombosis)     hx of on HRT left leg ~2006  . GERD (gastroesophageal reflux disease)   . Hyperlipidemia   . Hypertension   . Hypothyroidism   . PPD positive, treated     rx inh   . Diabetes mellitus   . 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 DJD (degenerative joint disease) of lumbar spine   . Chronic combined systolic and diastolic CHF (congestive heart failure)     a. 08/2012 Echo: EF 55-60%, no rwma, mod MR;  b. 07/2014 Echo: EF 45-50%, distal antsept HK, mod TR/MR, mildly bil-atrial enlargement.  . Paroxysmal atrial fibrillation     a. CHA2DS2VASc = 4-->coumadin.  [name] Physical deconditioning   . Allergy   . Anemia     low iron hx  . Cataract     removed ou  . OSA on CPAP     cpap 4.5 setting  . Breast cancer 2014    a. 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 Clear Cell Ovarian Cancer     a. 06/2014 s/p TAH/BSO debulking/lymph node dissection;  b. Now on chemo.    History   Social History  . Marital Status: Married    Spouse Name: N/A  . Number of Children: 2  . Years of Education: N/A   Occupational History  . [place] History Main Topics  . Smoking status: Former Smoker -- 1.00 packs/day for 1 years  . Smokeless tobacco: Never Used  . Alcohol Use: No  . Drug Use: No     Comment: quit age 9 only smoked as a teen 1 year  . Sexual Activity: No   Other Topics Concern  . Not on file   Social History Narrative   Married   Works at [employer] secretary   Daily caffeine use - 2 cups a day plus a couple of sodas a day   Pt doesn't exercise regularly   G2P2   H H of 5 soon to be 6 .     Past Surgical History  Procedure Laterality Date  . Tubal ligation    .  Cholecystectomy    . Foot surgery    . Eye surgery    . Cataract extraction    . Percutaneous liver biopsy    . Breast biopsy      left breast  . Mastectomy modified radical  06/08/2012    Procedure: MASTECTOMY MODIFIED RADICAL;  Surgeon: Edward Jolly, MD;  Location: [facility];  Service: General;  Laterality: Right;  . Portacath placement  06/08/2012    Procedure: INSERTION PORT-A-CATH;  Surgeon: Edward Jolly, MD;  Location: [facility];  Service: General;  Laterality: Left;  . Peg tube placement      peg removed 2015  . History of chemotherapy x 2 treatments, radiation tx  2014  . Pac removed  2015  . Laparotomy N/A 06/27/2014    Procedure: EXPLORATORY LAPAROTOMY;  Surgeon: Everitt Amber, MD;  Location: [facility];  Service: Gynecology;  Laterality: N/A;  . Abdominal hysterectomy N/A 06/27/2014    Procedure: HYSTERECTOMY ABDOMINAL TOTAL;  Surgeon: Everitt Amber, MD;  Location: [facility];  Service: Gynecology;  Laterality: N/A;  . Salpingoophorectomy Bilateral 06/27/2014    Procedure: BILATERAL SALPINGO OOPHORECTOMY/TUMOR  DEBULKING/LYMPHNODE DISSECTION, OMENTECTOMY;  Surgeon: Everitt Amber, MD;  Location: [facility];  Service: Gynecology;  Laterality: Bilateral;    Family History  Problem Relation Age of Onset  . Diabetes Mother   . Hypertension Mother   . Arthritis Mother   . Heart disease Mother   . Heart failure Mother   . Other Mother     benign breast mass  . Stroke Father   . Heart disease Father   . Dementia Father   . Diabetes Paternal Grandmother   . Colon cancer Paternal Grandfather     dx. 70s-80s  . Other Daughter     potentially has had negative BRCA testing  . Other Daughter     hysterectomy; potentially has had negative genetic testing    Allergies  Allergen Reactions  . Olmesartan Medoxomil Cough    REACTION: ? if cough  . Venlafaxine Other (See Comments)    REACTION: severe dry mouth  . Adhesive [Tape] Rash  . Tetracycline Hcl Other (See Comments)    Unknown reaction, too long for patient to remember     Current Outpatient Prescriptions on File Prior to Visit  Medication Sig Dispense Refill  . ALPRAZolam (XANAX XR) 1 MG 24 hr tablet Take 1 tablet (1 mg total) by mouth 2 (two) times daily. 240 tablet 0  . buPROPion (WELLBUTRIN XL) 150 MG 24 hr tablet Take 1 tablet (150 mg total) by mouth daily. 90 tablet 0  . clonazePAM (KLONOPIN) 0.5 MG tablet TAKE 1/2 TABLET TWICE DAILY AS NEEDED 30 tablet 0  . docusate sodium (COLACE) 100 MG capsule Take 1 capsule (100 mg total) by mouth 2 (two) times daily. (Patient taking differently: Take 100 mg by mouth 2 (two) times daily as needed. ) 20 capsule 0  . enoxaparin (LOVENOX) 100 MG/ML injection Inject 1 mL (100 mg total) into the skin daily. 90 Syringe 2  . escitalopram (LEXAPRO) 20 MG tablet Take 20 mg by mouth daily.    . famotidine (PEPCID) 20 MG tablet Take 1 tablet (20 mg total) by mouth daily. 30 tablet 0  . furosemide (LASIX) 20 MG tablet Take 1 tablet (20 mg total) by mouth daily. 90 tablet 1  . letrozole (FEMARA) 2.5 MG tablet Take 1  tablet (2.5 mg total) by mouth daily. 90 tablet 0  . levothyroxine (SYNTHROID, LEVOTHROID) 150 MCG tablet  Take 1 tablet (150 mcg total) by mouth daily. 90 tablet 1  . lidocaine-prilocaine (EMLA) cream Apply over port area 1-2 hours before chemotherapy 30 g 0  . metFORMIN (GLUCOPHAGE) 500 MG tablet Take 500 mg by mouth 2 (two) times daily with a meal.    . metoprolol (LOPRESSOR) 50 MG tablet Take 1 tablet (50 mg total) by mouth 2 (two) times daily. 180 tablet 1  . Nutritional Supplements (ENSURE HIGH PROTEIN PO) Take 237 mLs by mouth 2 (two) times daily.    [name] OLANZapine (ZYPREXA) 2.5 MG tablet Take 2.5 mg by mouth at bedtime.    . ondansetron (ZOFRAN) 8 MG tablet Take 1 tablet (8 mg total) by mouth 2 (two) times daily. Start the day after chemo for 2 days. Then take as needed for nausea or vomiting. 30 tablet 1  . potassium chloride (K-DUR) 10 MEQ tablet Take 10 mEq by mouth 2 (two) times daily.  3  . prochlorperazine (COMPAZINE) 10 MG tablet Take 1 tablet (10 mg total) by mouth every 6 (six) hours as needed (Nausea or vomiting). 30 tablet 1   No current facility-administered medications on file prior to visit.    BP 110/58 mmHg  Pulse 82  Temp(Src) 98 F (36.7 C) (Oral)  Resp 16  Ht '5\' 4"'  (1.626 m)  Wt 157 lb (71.215 kg)  BMI 26.94 kg/m2  SpO2 98%    Objective:   Physical Exam  Constitutional: She is oriented to person, place, and time. She appears well-developed and well-nourished.  HENT:  Head: Normocephalic and atraumatic.  Cardiovascular: Normal rate, regular rhythm and normal heart sounds.   No murmur heard. Pulmonary/Chest: Effort normal and breath sounds normal. No respiratory distress. She has no wheezes.  Neurological: She is alert and oriented to person, place, and time.  Skin: Skin is warm and dry.  Psychiatric: She has a normal mood and affect. Her behavior is normal. Judgment and thought content normal.          Assessment & Plan:

## 2014-12-17 ENCOUNTER — Telehealth: Payer: Self-pay | Admitting: Family

## 2014-12-17 NOTE — Telephone Encounter (Signed)
Sugar looks great. D/c metformin.

## 2014-12-18 ENCOUNTER — Other Ambulatory Visit: Payer: Self-pay

## 2014-12-18 NOTE — Telephone Encounter (Signed)
Spoke with spouse and he verbalized understanding, Stated he would let pt know.

## 2014-12-22 ENCOUNTER — Other Ambulatory Visit: Payer: Self-pay

## 2014-12-22 ENCOUNTER — Ambulatory Visit: Payer: Self-pay

## 2014-12-23 ENCOUNTER — Other Ambulatory Visit: Payer: Self-pay | Admitting: Family

## 2014-12-25 ENCOUNTER — Encounter: Payer: Self-pay | Admitting: Nurse Practitioner

## 2014-12-25 ENCOUNTER — Ambulatory Visit (HOSPITAL_BASED_OUTPATIENT_CLINIC_OR_DEPARTMENT_OTHER): Payer: BLUE CROSS/BLUE SHIELD | Admitting: Nurse Practitioner

## 2014-12-25 ENCOUNTER — Ambulatory Visit (HOSPITAL_BASED_OUTPATIENT_CLINIC_OR_DEPARTMENT_OTHER): Payer: BLUE CROSS/BLUE SHIELD

## 2014-12-25 ENCOUNTER — Other Ambulatory Visit (HOSPITAL_BASED_OUTPATIENT_CLINIC_OR_DEPARTMENT_OTHER): Payer: BLUE CROSS/BLUE SHIELD

## 2014-12-25 ENCOUNTER — Other Ambulatory Visit: Payer: Self-pay | Admitting: Nurse Practitioner

## 2014-12-25 VITALS — BP 114/54 | HR 74 | Temp 98.4°F | Resp 18 | Ht 64.0 in | Wt 157.6 lb

## 2014-12-25 DIAGNOSIS — Z86718 Personal history of other venous thrombosis and embolism: Secondary | ICD-10-CM

## 2014-12-25 DIAGNOSIS — C569 Malignant neoplasm of unspecified ovary: Secondary | ICD-10-CM | POA: Diagnosis not present

## 2014-12-25 DIAGNOSIS — C50911 Malignant neoplasm of unspecified site of right female breast: Secondary | ICD-10-CM | POA: Diagnosis not present

## 2014-12-25 DIAGNOSIS — G620 Drug-induced polyneuropathy: Secondary | ICD-10-CM | POA: Insufficient documentation

## 2014-12-25 DIAGNOSIS — Z5111 Encounter for antineoplastic chemotherapy: Secondary | ICD-10-CM | POA: Diagnosis not present

## 2014-12-25 DIAGNOSIS — I82403 Acute embolism and thrombosis of unspecified deep veins of lower extremity, bilateral: Secondary | ICD-10-CM

## 2014-12-25 DIAGNOSIS — I4891 Unspecified atrial fibrillation: Secondary | ICD-10-CM

## 2014-12-25 DIAGNOSIS — Z5189 Encounter for other specified aftercare: Secondary | ICD-10-CM | POA: Diagnosis not present

## 2014-12-25 DIAGNOSIS — Z79811 Long term (current) use of aromatase inhibitors: Secondary | ICD-10-CM

## 2014-12-25 DIAGNOSIS — K760 Fatty (change of) liver, not elsewhere classified: Secondary | ICD-10-CM

## 2014-12-25 DIAGNOSIS — T451X5A Adverse effect of antineoplastic and immunosuppressive drugs, initial encounter: Secondary | ICD-10-CM

## 2014-12-25 LAB — CBC WITH DIFFERENTIAL/PLATELET
BASO%: 0.3 % (ref 0.0–2.0)
Basophils Absolute: 0 10e3/uL (ref 0.0–0.1)
EOS%: 1.3 % (ref 0.0–7.0)
Eosinophils Absolute: 0.1 10e3/uL (ref 0.0–0.5)
HCT: 33.8 % — ABNORMAL LOW (ref 34.8–46.6)
HGB: 10.8 g/dL — ABNORMAL LOW (ref 11.6–15.9)
LYMPH%: 12.3 % — ABNORMAL LOW (ref 14.0–49.7)
MCH: 27.6 pg (ref 25.1–34.0)
MCHC: 32 g/dL (ref 31.5–36.0)
MCV: 86.1 fL (ref 79.5–101.0)
MONO#: 0.3 10e3/uL (ref 0.1–0.9)
MONO%: 7.4 % (ref 0.0–14.0)
NEUT#: 3.1 10e3/uL (ref 1.5–6.5)
NEUT%: 78.7 % — ABNORMAL HIGH (ref 38.4–76.8)
Platelets: 153 10e3/uL (ref 145–400)
RBC: 3.93 10e6/uL (ref 3.70–5.45)
RDW: 21 % — ABNORMAL HIGH (ref 11.2–14.5)
WBC: 4 10e3/uL (ref 3.9–10.3)
lymph#: 0.5 10e3/uL — ABNORMAL LOW (ref 0.9–3.3)

## 2014-12-25 LAB — COMPREHENSIVE METABOLIC PANEL (CC13)
ALK PHOS: 184 U/L — AB (ref 40–150)
ALT: 21 U/L (ref 0–55)
ANION GAP: 7 meq/L (ref 3–11)
AST: 33 U/L (ref 5–34)
Albumin: 3.1 g/dL — ABNORMAL LOW (ref 3.5–5.0)
BUN: 6.2 mg/dL — ABNORMAL LOW (ref 7.0–26.0)
CO2: 25 mEq/L (ref 22–29)
Calcium: 8.7 mg/dL (ref 8.4–10.4)
Chloride: 109 mEq/L (ref 98–109)
Creatinine: 0.6 mg/dL (ref 0.6–1.1)
Glucose: 160 mg/dl — ABNORMAL HIGH (ref 70–140)
POTASSIUM: 4 meq/L (ref 3.5–5.1)
Sodium: 142 mEq/L (ref 136–145)
Total Bilirubin: 0.64 mg/dL (ref 0.20–1.20)
Total Protein: 6.2 g/dL — ABNORMAL LOW (ref 6.4–8.3)

## 2014-12-25 MED ORDER — PACLITAXEL PROTEIN-BOUND CHEMO INJECTION 100 MG
100.0000 mg/m2 | Freq: Once | INTRAVENOUS | Status: AC
  Administered 2014-12-25: 175 mg via INTRAVENOUS
  Filled 2014-12-25: qty 35

## 2014-12-25 MED ORDER — SODIUM CHLORIDE 0.9 % IV SOLN
Freq: Once | INTRAVENOUS | Status: AC
  Administered 2014-12-25: 13:00:00 via INTRAVENOUS

## 2014-12-25 MED ORDER — HEPARIN SOD (PORK) LOCK FLUSH 100 UNIT/ML IV SOLN
500.0000 [IU] | Freq: Once | INTRAVENOUS | Status: AC | PRN
  Administered 2014-12-25: 500 [IU]
  Filled 2014-12-25: qty 5

## 2014-12-25 MED ORDER — SODIUM CHLORIDE 0.9 % IJ SOLN
10.0000 mL | INTRAMUSCULAR | Status: DC | PRN
  Administered 2014-12-25: 10 mL
  Filled 2014-12-25: qty 10

## 2014-12-25 MED ORDER — PEGFILGRASTIM 6 MG/0.6ML ~~LOC~~ PSKT
6.0000 mg | PREFILLED_SYRINGE | Freq: Once | SUBCUTANEOUS | Status: AC
  Administered 2014-12-25: 6 mg via SUBCUTANEOUS
  Filled 2014-12-25: qty 0.6

## 2014-12-25 MED ORDER — SODIUM CHLORIDE 0.9 % IV SOLN
Freq: Once | INTRAVENOUS | Status: AC
  Administered 2014-12-25: 13:00:00 via INTRAVENOUS
  Filled 2014-12-25: qty 4

## 2014-12-25 NOTE — Telephone Encounter (Signed)
Rx faxed to pharmacy  

## 2014-12-25 NOTE — Telephone Encounter (Signed)
See rx. 

## 2014-12-25 NOTE — Progress Notes (Signed)
ID: [name]   DOB: Sep 02, 1955  MR#: [id number]  XLK#:[id number]  PCP: [name], NP GYN:  SU: [facility] OTHER DG:UYQIHK [name], [name], [name]  CHIEF COMPLAINT:  Estrogen receptor positive Breast Cancer; Ovarian cancer; DVT  CURRENT TREATMENT: Letrozole; abraxane; lovenox   BREAST CANCER HISTORY: From the original consult note:  [name] noted a mass in her right breast mid December 2013, and as it did not spontaneously resolve over a couple of weeks she brought it to her primary physician's attention. She was set up for diagnostic mammography and right breast ultrasonography at the breast Center 05/10/2012. (Note that the patient's most recent prior mammography had been in October 2008). The current study showed a spiculated mass in the superior subareolar portion of the right breast measuring approximately 5 cm and associated with pleomorphic calcifications. This was firm and palpable. There was right nipple retraction and skin thickening. Ultrasound confirmed an irregularly marginated hypoechoic mass measuring 3.5 cm by ultrasound, and an abnormal appearing lower right axillary lymph node measuring 2.6 cm.  Biopsies of both the breast mass and the abnormal appearing lymph node were performed 05/21/2012. Both showed an invasive ductal carcinoma, grade 2, with similar prognostic panels (the breast mass was 100% estrogen and 73% progesterone receptor positive, with an MIB-1 of 5%; the lymph node was 100% estrogen 100% progesterone receptor positive, with an MIB-1 of 20%). Both masses were HER-2 negative.  Breast MRI obtained at [facility] imaging 05/29/2012 confirmed a dominant mass in the retroareolar right breast measuring 4.4 cm maximally. There was a satellite nodule inferior and lateral to this mass, measuring 1.7 cm. There were no other masses in either breast. Aside from the previously biopsied lymph node there were other mildly  enhancing level I right axillary lymph nodes which did not appear pathologic. There was no other lymphadenopathy noted.   The patient's subsequent history is as detailed below.  OVARIAN CANCER HISTORY: From the 06/16/2014 summary note:  "[name] returns today for review of her restaging studies accompanied by her husband [name]. To summarize her recent history: She was admitted to [facility] with a diagnosis of possible pneumonia in late December 2015. Chest x-ray showed some increased density in the right upper lobe. CT scan was suggested and was obtained by Dr. Inda Castle. This showed 2 small nodules in the right middle lobe, measuring 8 and 4 mm respectively. Dr. Inda Castle then contacted [name] for further evaluation and we set [name] up for a PET scan and which was performed late January. This showed the 2 nodules in the lung not to be hypermetabolic. This of course does not prove that they are not malignant. More importantly, there was a 13.7 cm cystic/solid mass in the upper pelvis associated with adenopathy,. All of this was hypermetabolic."  The patient was evaluated by gynecologic oncology and on 06/27/2014 she underwent surgery under Dr. Denman George. This consisted of an exploratory laparotomy with hysterectomy and abdominal salpingo-oophorectomy, as well as tumor debulking and omentectomy. 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 The tumor measured 11.5 cm, focally involve the ovarian capsule on the left; the uterus and right ovary and fallopian tube were unremarkable. One para-aortic lymph node was positive out of a total of 6 lymph nodes sampled (2 left common iliac, 2 left para-aortic, and to within the omental resection).  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 Today she  is due for cycle 6 of abraxane, which is given every 2 weeks. In general she is tolerating this well. Her only side effect so far is numbness to her entire left pinky finger and the very tip of her left ring finger. This is constant, and particularly noticeable after treatment. The rest of her extremities are unaffected.   REVIEW OF SYSTEMS: [name] denies fevers, chills, nausea, vomiting, or changes in bowel or bladder habits. She is eating remarkably well, and even supplements with Ensure daily. She denies shortness of breath, chest pain, cough, or palpitations. She continues on lovenox with no bleeding or bruising. She wears compression stockings full time. She denies headaches, dizziness, or vision changes. She endorses anxiety and depression. A detailed review of systems is otherwise stable.  PAST MEDICAL HISTORY: Past Medical History  Diagnosis Date  . Depression   . DVT (deep venous thrombosis)     hx of on HRT left leg ~2006  . GERD (gastroesophageal reflux disease)   . Hyperlipidemia   . Hypertension   . Hypothyroidism   . PPD positive, treated     rx inh   . Diabetes mellitus   . 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 DJD (degenerative joint disease) of lumbar spine   . Chronic combined systolic and diastolic CHF (congestive heart failure)     a. 08/2012 Echo: EF 55-60%, no rwma, mod MR;  b. 07/2014 Echo: EF 45-50%, distal antsept HK, mod TR/MR, mildly bil-atrial enlargement.  . Paroxysmal atrial fibrillation     a. CHA2DS2VASc = 4-->coumadin.  [name] Physical deconditioning   . Allergy   . Anemia     low iron hx  . Cataract     removed ou  . OSA on CPAP     cpap 4.5 setting  . Breast cancer 2014    a. 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 Clear Cell Ovarian Cancer     a. 06/2014 s/p TAH/BSO debulking/lymph node dissection;  b. Now on chemo.     PAST SURGICAL HISTORY: Past Surgical History  Procedure Laterality Date  . Tubal ligation    . Cholecystectomy    . Foot surgery    . Eye surgery    . Cataract extraction    . Percutaneous liver biopsy    . Breast biopsy      left breast  . Mastectomy modified radical  06/08/2012    Procedure: MASTECTOMY MODIFIED RADICAL;  Surgeon: Edward Jolly, MD;  Location: [facility];  Service: General;  Laterality: Right;  . Portacath placement  06/08/2012    Procedure: INSERTION PORT-A-CATH;  Surgeon: Edward Jolly, MD;  Location: [facility];  Service: General;  Laterality: Left;  . Peg tube placement      peg removed 2015  . History of chemotherapy x 2 treatments, radiation tx  2014  . Pac removed  2015  . Laparotomy N/A 06/27/2014    Procedure: EXPLORATORY LAPAROTOMY;  Surgeon: Everitt Amber, MD;  Location: [facility];  Service: Gynecology;  Laterality: N/A;  . Abdominal hysterectomy N/A 06/27/2014    Procedure: HYSTERECTOMY ABDOMINAL TOTAL;  Surgeon: Everitt Amber, MD;  Location: [facility];  Service: Gynecology;  Laterality: N/A;  . Salpingoophorectomy Bilateral 06/27/2014    Procedure: BILATERAL SALPINGO  OOPHORECTOMY/TUMOR DEBULKING/LYMPHNODE DISSECTION, OMENTECTOMY;  Surgeon: Everitt Amber, MD;  Location: [facility];  Service: Gynecology;  Laterality: Bilateral;    FAMILY HISTORY Family History  Problem Relation Age of Onset  . Diabetes Mother   . Hypertension Mother   . Arthritis Mother   . Heart disease Mother   . Heart failure Mother   . Other Mother     benign breast mass  . Stroke Father   . Heart disease Father   . Dementia Father   . Diabetes Paternal Grandmother   . Colon cancer Paternal Grandfather     dx. 70s-80s  . Other Daughter     potentially has had negative BRCA testing  . Other Daughter     hysterectomy; potentially has had negative genetic testing   the patient's father died in his 42s with a history of dementia. He had had prior strokes. The patient's mother died in her  59s, with a history of congestive heart failure. Maizee had no brothers, one sister. There is no history of breast or ovarian cancer in the family.  GYNECOLOGIC HISTORY: Menarche age 4, first live birth age 37, she is GX P2, menopause approximately 15 years ago, on hormone replacement until 2010.  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 Her husband [name] works for [employer]. Daughter Paul Half is a Physiological scientist and lives in [place]. Daughter Santiago Bur and her family (husband and 2 children aged 42 and 3 years) currently live with the patient. Cierrah is a member of a [employer].   ADVANCED DIRECTIVES: Not in place  HEALTH MAINTENANCE: (Updated October 2014) Social History  Substance Use Topics  . Smoking status: Former Smoker -- 1.00 packs/day for 1 years  . Smokeless tobacco: Never Used  . Alcohol Use: No     Colonoscopy: Never  PAP: Does not recall  Bone density: Never  Lipid panel:   Allergies  Allergen Reactions  . Olmesartan Medoxomil Cough    REACTION: ? if cough  . Venlafaxine Other (See Comments)    REACTION: severe dry mouth  . Adhesive [Tape] Rash  . Tetracycline Hcl Other (See Comments)    Unknown reaction, too long for patient to remember     Current Outpatient Prescriptions  Medication Sig Dispense Refill  . ALPRAZolam (XANAX XR) 1 MG 24 hr tablet Take 1 tablet (1 mg total) by mouth 2 (two) times daily. 240 tablet 0  . buPROPion (WELLBUTRIN XL) 150 MG 24 hr tablet Take 1 tablet (150 mg total) by mouth daily. 90 tablet 0  . clonazePAM (KLONOPIN) 0.5 MG tablet TAKE 1/2 TABLET BY MOUTH TWICE DAILY AS NEEDED 30 tablet 0  . docusate sodium (COLACE) 100 MG capsule Take 1 capsule (100 mg total) by mouth 2 (two) times daily. (Patient taking differently: Take 100 mg by mouth 2 (two) times daily as needed. ) 20 capsule 0  . enoxaparin  (LOVENOX) 100 MG/ML injection Inject 1 mL (100 mg total) into the skin daily. 90 Syringe 2  . escitalopram (LEXAPRO) 20 MG tablet Take 20 mg by mouth daily.    . famotidine (PEPCID) 20 MG tablet Take 1 tablet (20 mg total) by mouth daily. 30 tablet 0  . furosemide (LASIX) 20 MG tablet Take 1 tablet (20 mg total) by mouth daily. 90 tablet 1  . letrozole (FEMARA) 2.5 MG tablet Take 1 tablet (2.5 mg total) by mouth  daily. 90 tablet 0  . levothyroxine (SYNTHROID, LEVOTHROID) 150 MCG tablet Take 1 tablet (150 mcg total) by mouth daily. 90 tablet 1  . lidocaine-prilocaine (EMLA) cream Apply over port area 1-2 hours before chemotherapy 30 g 0  . metoprolol (LOPRESSOR) 50 MG tablet Take 1 tablet (50 mg total) by mouth 2 (two) times daily. 180 tablet 1  . Nutritional Supplements (ENSURE HIGH PROTEIN PO) Take 237 mLs by mouth 2 (two) times daily.    [name] OLANZapine (ZYPREXA) 2.5 MG tablet Take 2.5 mg by mouth at bedtime.    . ondansetron (ZOFRAN) 8 MG tablet Take 1 tablet (8 mg total) by mouth 2 (two) times daily. Start the day after chemo for 2 days. Then take as needed for nausea or vomiting. 30 tablet 1  . potassium chloride (K-DUR) 10 MEQ tablet Take 10 mEq by mouth 2 (two) times daily.  3  . prochlorperazine (COMPAZINE) 10 MG tablet Take 1 tablet (10 mg total) by mouth every 6 (six) hours as needed (Nausea or vomiting). 30 tablet 1   No current facility-administered medications for this visit.    OBJECTIVE: Middle-aged white woman in no acute distress Filed Vitals:   12/25/14 1102  BP: 114/54  Pulse: 74  Temp: 98.4 F (36.9 C)  Resp: 18     Body mass index is 27.04 kg/(m^2).    ECOG FS: 1 Filed Weights   12/25/14 1102  Weight: 157 lb 9.6 oz (71.487 kg)   Skin: warm, dry  HEENT: sclerae anicteric, conjunctivae pink, oropharynx clear. No thrush or mucositis.  Lymph Nodes: No cervical or supraclavicular lymphadenopathy  Lungs: clear to auscultation bilaterally, no rales, wheezes, or rhonci   Heart: regular rate and rhythm  Abdomen: round, soft, non tender, positive bowel sounds  Musculoskeletal: No focal spinal tenderness, no peripheral edema  Neuro: non focal, well oriented, positive affect  Breasts: deferred   Lab Results  Component Value Date   WBC 4.0 12/25/2014   NEUTROABS 3.1 12/25/2014   HGB 10.8* 12/25/2014   HCT 33.8* 12/25/2014   MCV 86.1 12/25/2014   PLT 153 12/25/2014      Chemistry      Component Value Date/Time   NA 142 12/25/2014 0955   NA 137 12/15/2014 0947   K 4.0 12/25/2014 0955   K 3.8 12/15/2014 0947   CL 103 12/15/2014 0947   CL 104 10/29/2012 1058   CO2 25 12/25/2014 0955   CO2 28 12/15/2014 0947   BUN 6.2* 12/25/2014 0955   BUN 10 12/15/2014 0947   CREATININE 0.6 12/25/2014 0955   CREATININE 0.40 12/15/2014 0947   CREATININE 0.69 11/10/2013 0828      Component Value Date/Time   CALCIUM 8.7 12/25/2014 0955   CALCIUM 8.5 12/15/2014 0947   ALKPHOS 184* 12/25/2014 0955   ALKPHOS 152* 08/18/2014 1320   AST 33 12/25/2014 0955   AST 40* 08/18/2014 1320   ALT 21 12/25/2014 0955   ALT 20 08/18/2014 1320   BILITOT 0.64 12/25/2014 0955   BILITOT 0.8 08/18/2014 1320      Results for AASHNA, MATSON (MRN [id number]) as of 12/11/2014 10:06  Ref. Range 09/08/2014 12:36 10/06/2014 07:53 10/27/2014 09:24 11/16/2014 10:54 11/24/2014 07:49  CA 125 Latest Ref Range: <35 U/mL 213 (H) 113 (H) 123 (H) 86 (H) 93 (H)    STUDIES: No results found.  ASSESSMENT: 59 y.o.  BRCA negative [name]   (1)  status post right mastectomy  06/08/2012 for a pT3, pN1a, stage  IIIA invasive ductal carcinoma, grade 2,  estrogen and progesterone receptor positive, HER-2/neu negative, with an MIB-1 of 20%.    (2)  treated in the adjuvant setting with docetaxel/ cyclophosphamide given every 3 weeks. There were multiple and severe complications, and the  patient tolerated only two cycles, last dose 07/29/2012  (3) letrozole started 08/16/2012  (a) osteopenia,  with a T score of -1.14 April 2013  (4) postmastectomy radiation completed 12/24/2012  (5)  comorbidities include diabetes, hypertension, chronic liver disease with cirrhosis and fatty liver, and nutritional disturbance with temporarily dependence on PEG feeds (discontinued July 2014).  (6) restaging studies January 2016 show  (a) 8 mm and 4 mm Right middle lobe lung nodules, not metabolically active on PET -- Rhonda require follow-up  (b) hypermetabolic 16.1 cm mixed solid/cystic lesion in upper pelvis, with associated adenopathy  (7) status post exploratory laparotomy 06/27/2014 with total abdominal hysterectomy, bilateral salpingo-oophorectomy, para-aortic lymphadenectomy, omentectomy and radical tumor debulking for a clear cell ovarian cancer, pT1c pN1, stage IIIC .  (8) adjuvant chemotherapy consisting of carboplatin and paclitaxel given weekly days 1 and 8 of each 21 day cycle, for 6-8 cycles as tolerated, started 07/17/2014, with onpro support  (a) day 8 cycle 2 omitted and further treatments cancelled because of an episode of Klebsiella sepsis requiring intensive care unit admission  (9) bilateral lower extremity DVTs documented 09/25/2014 despite being on Coumadin for PAF (subtherapeutic INR) with mobile Right common femoral clot  (a) status post thrombolysis 09/30/2014  (b) on Lovenox daily as of 10/02/2014  (c) IVC filter placed 10/26/2014 as mobile clot still noted Right groin  (10) started abraxane 10/13/2014, to be repeated every 14 days  (11) genetics testing December 04, 2014 through the Breast[facility]Ovarian Cancer Panel [facility] no deleterious mutations in ATM, BARD1, BRCA1, BRCA2, BRIP1, CDH1, CHEK2, FANCC, MLH1, MSH2, MSH6, NBN, PALB2, PMS2, PTEN, RAD51C, RAD51D, STK11, TP53, and XRCC2.     PLAN: Daizha looks and feels well today. The labs were reviewed in detail and were stable. She Rhonda proceed with cycle 6 of abraxane as planned today. I asked her to keep  track of the neuropathy symptoms she is experiencing to her left pinky and ring finger. She Rhonda let us know if this becomes worse or spreads.   I have placed the orders for a repeat doppler. As per Dr. Virgie Dad last note, if the clot appears stable or absent, we Rhonda have the IVC filter removed in September.   Jakelin Rhonda continue to be treated every 2 weeks. Her husband continues the request to be alternated between Dr. Jana Hakim and myself. I have attempted to accommodate this request for the month of September. She understands and agrees with this plan. She has been encouraged to call with any issues that might arise before her next visit here.  Laurie Panda, NP    12/25/2014

## 2014-12-25 NOTE — Patient Instructions (Signed)
[facility] Discharge Instructions for Patients Receiving Chemotherapy  Today you received the following chemotherapy agents: Abraxane.  To help prevent nausea and vomiting after your treatment, we encourage you to take your nausea medication:  Compazine 10 mg every 6 hours as needed.   If you develop nausea and vomiting that is not controlled by your nausea medication, call the clinic.   BELOW ARE SYMPTOMS THAT SHOULD BE REPORTED IMMEDIATELY:  *FEVER GREATER THAN 100.5 F  *CHILLS WITH OR WITHOUT FEVER  NAUSEA AND VOMITING THAT IS NOT CONTROLLED WITH YOUR NAUSEA MEDICATION  *UNUSUAL SHORTNESS OF BREATH  *UNUSUAL BRUISING OR BLEEDING  TENDERNESS IN MOUTH AND THROAT WITH OR WITHOUT PRESENCE OF ULCERS  *URINARY PROBLEMS  *BOWEL PROBLEMS  UNUSUAL RASH Items with * indicate a potential emergency and should be followed up as soon as possible.  Feel free to call the clinic you have any questions or concerns. The clinic phone number is [phone].  Please show the CHEMO ALERT CARD at check-in to the Emergency Department and triage nurse.   

## 2014-12-26 ENCOUNTER — Telehealth: Payer: Self-pay | Admitting: *Deleted

## 2014-12-26 LAB — CA 125: CA 125: 88 U/mL — AB (ref <35)

## 2014-12-26 MED ORDER — POTASSIUM CHLORIDE ER 10 MEQ PO TBCR: 10.0000 meq | EXTENDED_RELEASE_TABLET | Freq: Two times a day (BID) | ORAL | Status: DC

## 2014-12-26 NOTE — Telephone Encounter (Signed)
Received fax from [facility] for refill of potassium chloride ER 10 meQ twice a day.  Refills sent.

## 2015-01-01 ENCOUNTER — Other Ambulatory Visit: Payer: Self-pay

## 2015-01-03 ENCOUNTER — Other Ambulatory Visit (HOSPITAL_COMMUNITY): Payer: Self-pay | Admitting: Diagnostic Radiology

## 2015-01-03 DIAGNOSIS — I82403 Acute embolism and thrombosis of unspecified deep veins of lower extremity, bilateral: Secondary | ICD-10-CM

## 2015-01-05 ENCOUNTER — Ambulatory Visit: Payer: Self-pay

## 2015-01-05 ENCOUNTER — Other Ambulatory Visit: Payer: Self-pay

## 2015-01-08 ENCOUNTER — Ambulatory Visit (HOSPITAL_BASED_OUTPATIENT_CLINIC_OR_DEPARTMENT_OTHER): Payer: BLUE CROSS/BLUE SHIELD | Admitting: Oncology

## 2015-01-08 ENCOUNTER — Ambulatory Visit (HOSPITAL_BASED_OUTPATIENT_CLINIC_OR_DEPARTMENT_OTHER): Payer: BLUE CROSS/BLUE SHIELD

## 2015-01-08 ENCOUNTER — Other Ambulatory Visit (HOSPITAL_BASED_OUTPATIENT_CLINIC_OR_DEPARTMENT_OTHER): Payer: BLUE CROSS/BLUE SHIELD

## 2015-01-08 VITALS — BP 117/56 | HR 70 | Temp 100.0°F | Resp 18 | Ht 64.0 in | Wt 160.4 lb

## 2015-01-08 DIAGNOSIS — C569 Malignant neoplasm of unspecified ovary: Secondary | ICD-10-CM | POA: Diagnosis not present

## 2015-01-08 DIAGNOSIS — C50911 Malignant neoplasm of unspecified site of right female breast: Secondary | ICD-10-CM

## 2015-01-08 DIAGNOSIS — Z5111 Encounter for antineoplastic chemotherapy: Secondary | ICD-10-CM | POA: Diagnosis not present

## 2015-01-08 DIAGNOSIS — I4891 Unspecified atrial fibrillation: Secondary | ICD-10-CM

## 2015-01-08 DIAGNOSIS — Z170 Estrogen receptor positive status [ER+]: Secondary | ICD-10-CM | POA: Diagnosis not present

## 2015-01-08 DIAGNOSIS — Z5189 Encounter for other specified aftercare: Secondary | ICD-10-CM | POA: Diagnosis not present

## 2015-01-08 DIAGNOSIS — Z86718 Personal history of other venous thrombosis and embolism: Secondary | ICD-10-CM

## 2015-01-08 DIAGNOSIS — G620 Drug-induced polyneuropathy: Secondary | ICD-10-CM

## 2015-01-08 DIAGNOSIS — K760 Fatty (change of) liver, not elsewhere classified: Secondary | ICD-10-CM

## 2015-01-08 LAB — CBC WITH DIFFERENTIAL/PLATELET
BASO%: 0.7 % (ref 0.0–2.0)
Basophils Absolute: 0 10*3/uL (ref 0.0–0.1)
EOS%: 1.7 % (ref 0.0–7.0)
Eosinophils Absolute: 0.1 10*3/uL (ref 0.0–0.5)
HEMATOCRIT: 33.3 % — AB (ref 34.8–46.6)
HGB: 10.6 g/dL — ABNORMAL LOW (ref 11.6–15.9)
LYMPH#: 0.7 10*3/uL — AB (ref 0.9–3.3)
LYMPH%: 12.5 % — AB (ref 14.0–49.7)
MCH: 26.9 pg (ref 25.1–34.0)
MCHC: 31.7 g/dL (ref 31.5–36.0)
MCV: 85 fL (ref 79.5–101.0)
MONO#: 0.5 10*3/uL (ref 0.1–0.9)
MONO%: 9.8 % (ref 0.0–14.0)
NEUT#: 4 10*3/uL (ref 1.5–6.5)
NEUT%: 75.3 % (ref 38.4–76.8)
Platelets: 143 10*3/uL — ABNORMAL LOW (ref 145–400)
RBC: 3.92 10*6/uL (ref 3.70–5.45)
RDW: 22.5 % — ABNORMAL HIGH (ref 11.2–14.5)
WBC: 5.3 10*3/uL (ref 3.9–10.3)

## 2015-01-08 LAB — COMPREHENSIVE METABOLIC PANEL (CC13)
ALT: 20 U/L (ref 0–55)
AST: 32 U/L (ref 5–34)
Albumin: 3.1 g/dL — ABNORMAL LOW (ref 3.5–5.0)
Alkaline Phosphatase: 187 U/L — ABNORMAL HIGH (ref 40–150)
Anion Gap: 5 mEq/L (ref 3–11)
BUN: 8.7 mg/dL (ref 7.0–26.0)
CHLORIDE: 112 meq/L — AB (ref 98–109)
CO2: 26 meq/L (ref 22–29)
CREATININE: 0.6 mg/dL (ref 0.6–1.1)
Calcium: 9 mg/dL (ref 8.4–10.4)
EGFR: >90 mL/min/{1.73_m2} (ref >90)
GLUCOSE: 112 mg/dL (ref 70–140)
Potassium: 4.2 mEq/L (ref 3.5–5.1)
SODIUM: 143 meq/L (ref 136–145)
Total Bilirubin: 0.74 mg/dL (ref 0.20–1.20)
Total Protein: 6.2 g/dL — ABNORMAL LOW (ref 6.4–8.3)

## 2015-01-08 MED ORDER — SODIUM CHLORIDE 0.9 % IV SOLN
Freq: Once | INTRAVENOUS | Status: AC
  Administered 2015-01-08: 12:00:00 via INTRAVENOUS
  Filled 2015-01-08: qty 4

## 2015-01-08 MED ORDER — SODIUM CHLORIDE 0.9 % IJ SOLN
10.0000 mL | INTRAMUSCULAR | Status: DC | PRN
  Administered 2015-01-08: 10 mL
  Filled 2015-01-08: qty 10

## 2015-01-08 MED ORDER — PACLITAXEL PROTEIN-BOUND CHEMO INJECTION 100 MG
100.0000 mg/m2 | Freq: Once | INTRAVENOUS | Status: AC
  Administered 2015-01-08: 175 mg via INTRAVENOUS
  Filled 2015-01-08: qty 35

## 2015-01-08 MED ORDER — HEPARIN SOD (PORK) LOCK FLUSH 100 UNIT/ML IV SOLN
500.0000 [IU] | Freq: Once | INTRAVENOUS | Status: AC | PRN
  Administered 2015-01-08: 500 [IU]
  Filled 2015-01-08: qty 5

## 2015-01-08 MED ORDER — SODIUM CHLORIDE 0.9 % IV SOLN
Freq: Once | INTRAVENOUS | Status: AC
  Administered 2015-01-08: 12:00:00 via INTRAVENOUS

## 2015-01-08 MED ORDER — PEGFILGRASTIM 6 MG/0.6ML ~~LOC~~ PSKT
6.0000 mg | PREFILLED_SYRINGE | Freq: Once | SUBCUTANEOUS | Status: AC
  Administered 2015-01-08: 6 mg via SUBCUTANEOUS
  Filled 2015-01-08: qty 0.6

## 2015-01-08 NOTE — Patient Instructions (Signed)
[facility] Discharge Instructions for Patients Receiving Chemotherapy  Today you received the following chemotherapy agents: Abraxane   To help prevent nausea and vomiting after your treatment, we encourage you to take your nausea medication as directed.    If you develop nausea and vomiting that is not controlled by your nausea medication, call the clinic.   BELOW ARE SYMPTOMS THAT SHOULD BE REPORTED IMMEDIATELY:  *FEVER GREATER THAN 100.5 F  *CHILLS WITH OR WITHOUT FEVER  NAUSEA AND VOMITING THAT IS NOT CONTROLLED WITH YOUR NAUSEA MEDICATION  *UNUSUAL SHORTNESS OF BREATH  *UNUSUAL BRUISING OR BLEEDING  TENDERNESS IN MOUTH AND THROAT WITH OR WITHOUT PRESENCE OF ULCERS  *URINARY PROBLEMS  *BOWEL PROBLEMS  UNUSUAL RASH Items with * indicate a potential emergency and should be followed up as soon as possible.  Feel free to call the clinic you have any questions or concerns. The clinic phone number is [phone].  Please show the [facility] at check-in to the Emergency Department and triage nurse.   Pegfilgrastim injection What is this medicine? PEGFILGRASTIM (peg fil GRA stim) is a long-acting granulocyte colony-stimulating factor that stimulates the growth of neutrophils, a type of white blood cell important in the body's fight against infection. It is used to reduce the incidence of fever and infection in patients with certain types of cancer who are receiving chemotherapy that affects the bone marrow. This medicine may be used for other purposes; ask your health care provider or pharmacist if you have questions. COMMON BRAND NAME(S): Neulasta What should I tell my health care provider before I take this medicine? They need to know if you have any of these conditions: -latex allergy -ongoing radiation therapy -sickle cell disease -skin reactions to acrylic adhesives (On-Body Injector only) -an unusual or allergic reaction to pegfilgrastim,  filgrastim, other medicines, foods, dyes, or preservatives -pregnant or trying to get pregnant -breast-feeding How should I use this medicine? This medicine is for injection under the skin. If you get this medicine at home, you will be taught how to prepare and give the pre-filled syringe or how to use the On-body Injector. Refer to the patient Instructions for Use for detailed instructions. Use exactly as directed. Take your medicine at regular intervals. Do not take your medicine more often than directed. It is important that you put your used needles and syringes in a special sharps container. Do not put them in a trash can. If you do not have a sharps container, call your pharmacist or healthcare provider to get one. Talk to your pediatrician regarding the use of this medicine in children. Special care may be needed. Overdosage: If you think you have taken too much of this medicine contact a poison control center or emergency room at once. NOTE: This medicine is only for you. Do not share this medicine with others. What if I miss a dose? It is important not to miss your dose. Call your doctor or health care professional if you miss your dose. If you miss a dose due to an On-body Injector failure or leakage, a new dose should be administered as soon as possible using a single prefilled syringe for manual use. What may interact with this medicine? Interactions have not been studied. Give your health care provider a list of all the medicines, herbs, non-prescription drugs, or dietary supplements you use. Also tell them if you smoke, drink alcohol, or use illegal drugs. Some items may interact with your medicine. This list may not  describe all possible interactions. Give your health care provider a list of all the medicines, herbs, non-prescription drugs, or dietary supplements you use. Also tell them if you smoke, drink alcohol, or use illegal drugs. Some items may interact with your medicine. What  should I watch for while using this medicine? You may need blood work done while you are taking this medicine. If you are going to need a MRI, CT scan, or other procedure, tell your doctor that you are using this medicine (On-Body Injector only). What side effects may I notice from receiving this medicine? Side effects that you should report to your doctor or health care professional as soon as possible: -allergic reactions like skin rash, itching or hives, swelling of the face, lips, or tongue -dizziness -fever -pain, redness, or irritation at site where injected -pinpoint red spots on the skin -shortness of breath or breathing problems -stomach or side pain, or pain at the shoulder -swelling -tiredness -trouble passing urine Side effects that usually do not require medical attention (report to your doctor or health care professional if they continue or are bothersome): -bone pain -muscle pain This list may not describe all possible side effects. Call your doctor for medical advice about side effects. You may report side effects to FDA at 1-800-FDA-1088. Where should I keep my medicine? Keep out of the reach of children. Store pre-filled syringes in a refrigerator between 2 and 8 degrees C (36 and 46 degrees F). Do not freeze. Keep in carton to protect from light. Throw away this medicine if it is left out of the refrigerator for more than 48 hours. Throw away any unused medicine after the expiration date. NOTE: This sheet is a summary. It may not cover all possible information. If you have questions about this medicine, talk to your doctor, pharmacist, or health care provider.  2015, Elsevier/Gold Standard. (2013-07-28 16:14:05)

## 2015-01-08 NOTE — Progress Notes (Signed)
ID: [name]   DOB: [birth year]  MR#: [id number]  FTD#:[id number]  PCP: [name], NP GYN:  SU: [facility] OTHER CW:[name], Shanon Ace, Roney Jaffe  CHIEF COMPLAINT:  Estrogen receptor positive Breast Cancer; Ovarian cancer; DVT  CURRENT TREATMENT: Letrozole; abraxane; lovenox   BREAST CANCER HISTORY: From the original consult note:  [name] noted a mass in her right breast mid December 2013, and as it did not spontaneously resolve over a couple of weeks she brought it to her primary physician's attention. She was set up for diagnostic mammography and right breast ultrasonography at the breast Center 05/10/2012. (Note that the patient's most recent prior mammography had been in October 2008). The current study showed a spiculated mass in the superior subareolar portion of the right breast measuring approximately 5 cm and associated with pleomorphic calcifications. This was firm and palpable. There was right nipple retraction and skin thickening. Ultrasound confirmed an irregularly marginated hypoechoic mass measuring 3.5 cm by ultrasound, and an abnormal appearing lower right axillary lymph node measuring 2.6 cm.  Biopsies of both the breast mass and the abnormal appearing lymph node were performed 05/21/2012. Both showed an invasive ductal carcinoma, grade 2, with similar prognostic panels (the breast mass was 100% estrogen and 73% progesterone receptor positive, with an MIB-1 of 5%; the lymph node was 100% estrogen 100% progesterone receptor positive, with an MIB-1 of 20%). Both masses were HER-2 negative.  Breast MRI obtained at [facility] imaging 05/29/2012 confirmed a dominant mass in the retroareolar right breast measuring 4.4 cm maximally. There was a satellite nodule inferior and lateral to this mass, measuring 1.7 cm. There were no other masses in either breast. Aside from the previously biopsied lymph node there were other mildly  enhancing level I right axillary lymph nodes which did not appear pathologic. There was no other lymphadenopathy noted.   The patient's subsequent history is as detailed below.  OVARIAN CANCER HISTORY: From the 06/16/2014 summary note:  "[name] returns today for review of her restaging studies accompanied by her husband [name]. To summarize her recent history: She was admitted to [facility] with a diagnosis of possible pneumonia in late December 2015. Chest x-ray showed some increased density in the right upper lobe. CT scan was suggested and was obtained by Dr. Inda Castle. This showed 2 small nodules in the right middle lobe, measuring 8 and 4 mm respectively. Dr. Inda Castle then contacted [name] for further evaluation and we set [name] up for a PET scan and which was performed late January. This showed the 2 nodules in the lung not to be hypermetabolic. This of course does not prove that they are not malignant. More importantly, there was a 13.7 cm cystic/solid mass in the upper pelvis associated with adenopathy,. All of this was hypermetabolic."  The patient was evaluated by gynecologic oncology and on 06/27/2014 she underwent surgery under Dr. Denman George. This consisted of an exploratory laparotomy with hysterectomy and abdominal salpingo-oophorectomy, as well as tumor debulking and omentectomy. 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 The tumor measured 11.5 cm, focally involve the ovarian capsule on the left; the uterus and right ovary and fallopian tube were unremarkable. One para-aortic lymph node was positive out of a total of 6 lymph nodes sampled (2 left common iliac, 2 left para-aortic, and to within the omental resection).  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 Today is  day 1 cycle 4 (dose #7) of abraxane given every 2 weeks. She feels she is doing "great" with the Abraxane. She does have numbness and tingling in the fourth and fifth digits of the left hand, but not in any other fingers and non-of her toes. She has no nausea or vomiting problems from the chemotherapy. The port is working well. She does have some fatigue for a few days after each treatment but recovers by the following week.  REVIEW OF SYSTEMS: On the "good" week [name] does some house work and gets around more, but she seldom leaves the house. She does admit to being anxious and depressed, but this is better than before. She is eating "great" and has gained a little bit more weight, which in her is very positive. There has been no change in bowel or bladder habits, no unusual headaches, visual changes, no cough or phlegm production. A detailed review of systems today was otherwise stable  PAST MEDICAL HISTORY: Past Medical History  Diagnosis Date  . Depression   . DVT (deep venous thrombosis)     hx of on HRT left leg ~2006  . GERD (gastroesophageal reflux disease)   . Hyperlipidemia   . Hypertension   . Hypothyroidism   . PPD positive, treated     rx inh   . Diabetes mellitus   . 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 DJD (degenerative joint disease) of lumbar spine   . Chronic combined systolic and diastolic CHF (congestive heart failure)     a. 08/2012 Echo: EF 55-60%, no rwma, mod MR;  b. 07/2014 Echo: EF 45-50%, distal antsept HK, mod TR/MR, mildly bil-atrial enlargement.  . Paroxysmal atrial fibrillation     a. CHA2DS2VASc = 4-->coumadin.  [name] Kitchen Physical deconditioning   . Allergy   . Anemia     low iron hx  . Cataract     removed ou  . OSA on CPAP     cpap 4.5 setting  . Breast cancer 2014    a. 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 Clear Cell  Ovarian Cancer     a. 06/2014 s/p TAH/BSO debulking/lymph node dissection;  b. Now on chemo.    PAST SURGICAL HISTORY: Past Surgical History  Procedure Laterality Date  . Tubal ligation    . Cholecystectomy    . Foot surgery    . Eye surgery    . Cataract extraction    . Percutaneous liver biopsy    . Breast biopsy      left breast  . Mastectomy modified radical  06/08/2012    Procedure: MASTECTOMY MODIFIED RADICAL;  Surgeon: Edward Jolly, MD;  Location: [facility];  Service: General;  Laterality: Right;  . Portacath placement  06/08/2012    Procedure: INSERTION PORT-A-CATH;  Surgeon: Edward Jolly, MD;  Location: [place];  Service: General;  Laterality: Left;  . Peg tube placement      peg removed 2015  . History of chemotherapy x 2 treatments, radiation tx  2014  . Pac removed  2015  . Laparotomy N/A 06/27/2014    Procedure: EXPLORATORY LAPAROTOMY;  Surgeon: Everitt Amber, MD;  Location: [facility];  Service: Gynecology;  Laterality: N/A;  . Abdominal hysterectomy N/A 06/27/2014  Procedure: HYSTERECTOMY ABDOMINAL TOTAL;  Surgeon: Everitt Amber, MD;  Location: [facility];  Service: Gynecology;  Laterality: N/A;  . Salpingoophorectomy Bilateral 06/27/2014    Procedure: BILATERAL SALPINGO OOPHORECTOMY/TUMOR DEBULKING/LYMPHNODE DISSECTION, OMENTECTOMY;  Surgeon: Everitt Amber, MD;  Location: [facility];  Service: Gynecology;  Laterality: Bilateral;    FAMILY HISTORY Family History  Problem Relation Age of Onset  . Diabetes Mother   . Hypertension Mother   . Arthritis Mother   . Heart disease Mother   . Heart failure Mother   . Other Mother     benign breast mass  . Stroke Father   . Heart disease Father   . Dementia Father   . Diabetes Paternal Grandmother   . Colon cancer Paternal Grandfather     dx. 70s-80s  . Other Daughter     potentially has had negative BRCA testing  . Other Daughter     hysterectomy; potentially has had negative genetic testing   the patient's father died in his  52s with a history of dementia. He had had prior strokes. The patient's mother died in her 36s, with a history of congestive heart failure. Kania had no brothers, one sister. There is no history of breast or ovarian cancer in the family.  GYNECOLOGIC HISTORY: Menarche age 21, first live birth age 42, she is GX P2, menopause approximately 15 years ago, on hormone replacement until 2010.  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 Her husband [name] works for [employer]. Daughter Paul Half is a Physiological scientist and lives in [place]. Daughter Santiago Bur and her family (husband and 2 children aged 33 and 3 years) currently live with the patient. Astryd is a member of a [employer].   ADVANCED DIRECTIVES: Not in place  HEALTH MAINTENANCE: (Updated October 2014) Social History  Substance Use Topics  . Smoking status: Former Smoker -- 1.00 packs/day for 1 years  . Smokeless tobacco: Never Used  . Alcohol Use: No     Colonoscopy: Never  PAP: Does not recall  Bone density: Never  Lipid panel:   Allergies  Allergen Reactions  . Olmesartan Medoxomil Cough    REACTION: ? if cough  . Venlafaxine Other (See Comments)    REACTION: severe dry mouth  . Adhesive [Tape] Rash  . Tetracycline Hcl Other (See Comments)    Unknown reaction, too long for patient to remember     Current Outpatient Prescriptions  Medication Sig Dispense Refill  . ALPRAZolam (XANAX XR) 1 MG 24 hr tablet Take 1 tablet (1 mg total) by mouth 2 (two) times daily. 240 tablet 0  . buPROPion (WELLBUTRIN XL) 150 MG 24 hr tablet Take 1 tablet (150 mg total) by mouth daily. 90 tablet 0  . clonazePAM (KLONOPIN) 0.5 MG tablet TAKE 1/2 TABLET BY MOUTH TWICE DAILY AS NEEDED 30 tablet 0  . docusate sodium (COLACE) 100 MG capsule Take 1 capsule (100 mg total) by mouth 2 (two) times daily. (Patient taking  differently: Take 100 mg by mouth 2 (two) times daily as needed. ) 20 capsule 0  . enoxaparin (LOVENOX) 100 MG/ML injection Inject 1 mL (100 mg total) into the skin daily. 90 Syringe 2  . escitalopram (LEXAPRO) 20 MG tablet Take 20 mg by mouth daily.    . famotidine (PEPCID) 20 MG tablet Take 1 tablet (20 mg total) by mouth daily. 30 tablet 0  . furosemide (LASIX)  20 MG tablet Take 1 tablet (20 mg total) by mouth daily. 90 tablet 1  . letrozole (FEMARA) 2.5 MG tablet Take 1 tablet (2.5 mg total) by mouth daily. 90 tablet 0  . levothyroxine (SYNTHROID, LEVOTHROID) 150 MCG tablet Take 1 tablet (150 mcg total) by mouth daily. 90 tablet 1  . lidocaine-prilocaine (EMLA) cream Apply over port area 1-2 hours before chemotherapy 30 g 0  . metoprolol (LOPRESSOR) 50 MG tablet Take 1 tablet (50 mg total) by mouth 2 (two) times daily. 180 tablet 1  . Nutritional Supplements (ENSURE HIGH PROTEIN PO) Take 237 mLs by mouth 2 (two) times daily.    [name] Kitchen OLANZapine (ZYPREXA) 2.5 MG tablet Take 2.5 mg by mouth at bedtime.    . ondansetron (ZOFRAN) 8 MG tablet Take 1 tablet (8 mg total) by mouth 2 (two) times daily. Start the day after chemo for 2 days. Then take as needed for nausea or vomiting. 30 tablet 1  . potassium chloride (K-DUR) 10 MEQ tablet Take 1 tablet (10 mEq total) by mouth 2 (two) times daily. 180 tablet 1  . prochlorperazine (COMPAZINE) 10 MG tablet Take 1 tablet (10 mg total) by mouth every 6 (six) hours as needed (Nausea or vomiting). 30 tablet 1   No current facility-administered medications for this visit.    OBJECTIVE: Middle-aged white woman who appears stated age 59 Vitals:   01/08/15 0944  BP: 117/56  Pulse: 70  Temp: 100 F (37.8 C)  Resp: 18     Body mass index is 27.52 kg/(m^2).    ECOG FS: 1 Filed Weights   01/08/15 0944  Weight: 160 lb 6.4 oz (72.757 kg)   Sclerae unicteric, EOMs intact Oropharynx clear and moist deferred No cervical or supraclavicular adenopathy Lungs no  rales or rhonchi Heart regular rate and rhythm Abd soft, nontender, positive bowel sounds MSK no focal spinal tenderness, no upper extremity lymphedema Neuro: nonfocal, well oriented, appropriate affect Breasts: Deferred    Lab Results  Component Value Date   WBC 5.3 01/08/2015   NEUTROABS 4.0 01/08/2015   HGB 10.6* 01/08/2015   HCT 33.3* 01/08/2015   MCV 85.0 01/08/2015   PLT 143* 01/08/2015   Results for JOBY, HERSHKOWITZ (MRN [id number]) as of 01/08/2015 10:15  Ref. Range 10/06/2014 07:53 10/27/2014 09:24 11/16/2014 10:54 11/24/2014 07:49 12/25/2014 09:55  CA 125 Latest Ref Range: <35 U/mL 113 (H) 123 (H) 86 (H) 93 (H) 88 (H)      Chemistry      Component Value Date/Time   NA 143 01/08/2015 0905   NA 137 12/15/2014 0947   K 4.2 01/08/2015 0905   K 3.8 12/15/2014 0947   CL 103 12/15/2014 0947   CL 104 10/29/2012 1058   CO2 26 01/08/2015 0905   CO2 28 12/15/2014 0947   BUN 8.7 01/08/2015 0905   BUN 10 12/15/2014 0947   CREATININE 0.6 01/08/2015 0905   CREATININE 0.40 12/15/2014 0947   CREATININE 0.69 11/10/2013 0828      Component Value Date/Time   CALCIUM 9.0 01/08/2015 0905   CALCIUM 8.5 12/15/2014 0947   ALKPHOS 187* 01/08/2015 0905   ALKPHOS 152* 08/18/2014 1320   AST 32 01/08/2015 0905   AST 40* 08/18/2014 1320   ALT 20 01/08/2015 0905   ALT 20 08/18/2014 1320   BILITOT 0.74 01/08/2015 0905   BILITOT 0.8 08/18/2014 1320     STUDIES: No results found.  ASSESSMENT: 59 y.o.  BRCA negative [name] woman   (1)  status post right mastectomy  06/08/2012 for a pT3, pN1a, stage IIIA invasive ductal carcinoma, grade 2,  estrogen and progesterone receptor positive, HER-2/neu negative, with an MIB-1 of 20%.    (2)  treated in the adjuvant setting with docetaxel/ cyclophosphamide given every 3 weeks. There were multiple and severe complications, and the  patient tolerated only two cycles, last dose 07/29/2012  (3) letrozole started 08/16/2012  (a) osteopenia, with a  T score of -1.14 April 2013  (4) postmastectomy radiation completed 12/24/2012  (5)  comorbidities include diabetes, hypertension, chronic liver disease with cirrhosis and fatty liver, and nutritional disturbance with temporarily dependence on PEG feeds (discontinued July 2014).  (6) restaging studies January 2016 show  (a) 8 mm and 4 mm Right middle lobe lung nodules, not metabolically active on PET -- Rhonda require follow-up  (b) hypermetabolic 32.9 cm mixed solid/cystic lesion in upper pelvis, with associated adenopathy  (7) status post exploratory laparotomy 06/27/2014 with total abdominal hysterectomy, bilateral salpingo-oophorectomy, para-aortic lymphadenectomy, omentectomy and radical tumor debulking for a clear cell ovarian cancer, pT1c pN1, stage IIIC .  (8) adjuvant chemotherapy consisting of carboplatin and paclitaxel given weekly days 1 and 8 of each 21 day cycle, for 6-8 cycles as tolerated, started 07/17/2014, with onpro support  (a) day 8 cycle 2 omitted and further treatments cancelled because of an episode of Klebsiella sepsis requiring intensive care unit admission  (9) bilateral lower extremity DVTs documented 09/25/2014 despite being on Coumadin for PAF (subtherapeutic INR) with mobile Right common femoral clot  (a) status post thrombolysis 09/30/2014  (b) on Lovenox daily as of 10/02/2014  (c) IVC filter placed 10/26/2014 as mobile clot still noted Right groin  (d) IVC filter to be removed 01/30/2015 if repeat dopplers before that allow it  (10) started abraxane 10/13/2014, to be repeated every 14 days for 6 cycles (12 doses)  (11) genetics testing December 04, 2014 through the Breast[facility]Ovarian Cancer Panel [facility] no deleterious mutations in ATM, BARD1, BRCA1, BRCA2, BRIP1, CDH1, CHEK2, FANCC, MLH1, MSH2, MSH6, NBN, PALB2, PMS2, PTEN, RAD51C, RAD51D, STK11, TP53, and XRCC2.    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   PLAN: Ceazia is tolerating her Abraxane remarkably well. I don't believe that she is developing significant neuropathy due to this. She has developed a problem in the ulnar nerve distribution of her fourth and fifth digits of the left hand, but she is not aware of any trauma to that nerve and I don't have a simple explanation for that localization.  I would not stop the Abraxane on that basis however. It is probably the best chemotherapy we can give her that she can tolerate. Ideally we would continue to a total of 12 doses, or 6 cycles, his cycle being treatment on days 1 and 15 of every 28 days. The CEA 125 is stable to improved  Incidentally [name] is coming up on his disability hearing next week.  We are alternating visits between our APP and myself every 2 weeks. After [name] completes her treatments in early November she Rhonda be set up for restaging studies.  Chauncey Cruel, MD    01/08/2015

## 2015-01-09 LAB — CA 125: CA 125: 103 U/mL — AB (ref <35)

## 2015-01-16 ENCOUNTER — Ambulatory Visit (HOSPITAL_COMMUNITY)
Admission: RE | Admit: 2015-01-16 | Discharge: 2015-01-16 | Disposition: A | Discharge status: Home / Self Care | Payer: BLUE CROSS/BLUE SHIELD | Source: Ambulatory Visit | Attending: Nurse Practitioner | Admitting: Nurse Practitioner

## 2015-01-16 DIAGNOSIS — I803 Phlebitis and thrombophlebitis of lower extremities, unspecified: Secondary | ICD-10-CM | POA: Diagnosis not present

## 2015-01-16 DIAGNOSIS — I82403 Acute embolism and thrombosis of unspecified deep veins of lower extremity, bilateral: Secondary | ICD-10-CM

## 2015-01-16 NOTE — Progress Notes (Signed)
VASCULAR LAB PRELIMINARY  PRELIMINARY  PRELIMINARY  PRELIMINARY  Bilateral lower extremity venous duplex  completed.    Preliminary report:  Right:  Subacute DVT noted in the FV and popliteal vein with reconstitution of flow.  Previously noted mobile thrombus in the CFV appears resolved.  Left:  Subacute DVT noted in the CFV, FV, and popliteal vein with reconstitution of flow.  Aleza Pew, RVT 01/16/2015, 9:44 AM

## 2015-01-18 ENCOUNTER — Other Ambulatory Visit: Payer: Self-pay

## 2015-01-18 DIAGNOSIS — C50911 Malignant neoplasm of unspecified site of right female breast: Secondary | ICD-10-CM

## 2015-01-18 NOTE — Telephone Encounter (Signed)
Request from pharmacy for alprazolam refill from [facility].  Patient was issued 240 tabs on 10/23/14 to be taken BID.  Patient should have 60 tabs left.  Refill denied.

## 2015-01-19 ENCOUNTER — Telehealth: Payer: Self-pay | Admitting: Family

## 2015-01-19 NOTE — Telephone Encounter (Signed)
Caller name:Chisolm,[name] Relation to CX:[id number]  Call back number:[phone] Pharmacy: [place], [place] [phone] (Phone) [phone] (Fax)         Reason for call:  Spouse requesting a refill ALPRAZolam (XANAX XR) 1 MG 24 hr tablet please send Rx to [facility]

## 2015-01-22 ENCOUNTER — Other Ambulatory Visit (HOSPITAL_BASED_OUTPATIENT_CLINIC_OR_DEPARTMENT_OTHER): Payer: BLUE CROSS/BLUE SHIELD

## 2015-01-22 ENCOUNTER — Encounter: Payer: Self-pay | Admitting: Nurse Practitioner

## 2015-01-22 ENCOUNTER — Encounter: Payer: Self-pay | Admitting: General Practice

## 2015-01-22 ENCOUNTER — Ambulatory Visit (HOSPITAL_BASED_OUTPATIENT_CLINIC_OR_DEPARTMENT_OTHER): Payer: BLUE CROSS/BLUE SHIELD | Admitting: Nurse Practitioner

## 2015-01-22 ENCOUNTER — Ambulatory Visit (HOSPITAL_BASED_OUTPATIENT_CLINIC_OR_DEPARTMENT_OTHER): Payer: BLUE CROSS/BLUE SHIELD

## 2015-01-22 VITALS — BP 114/53 | HR 75 | Temp 97.5°F | Resp 18 | Ht 64.0 in | Wt 163.3 lb

## 2015-01-22 DIAGNOSIS — C569 Malignant neoplasm of unspecified ovary: Secondary | ICD-10-CM

## 2015-01-22 DIAGNOSIS — Z5189 Encounter for other specified aftercare: Secondary | ICD-10-CM

## 2015-01-22 DIAGNOSIS — C50911 Malignant neoplasm of unspecified site of right female breast: Secondary | ICD-10-CM

## 2015-01-22 DIAGNOSIS — D696 Thrombocytopenia, unspecified: Secondary | ICD-10-CM | POA: Diagnosis not present

## 2015-01-22 DIAGNOSIS — Z170 Estrogen receptor positive status [ER+]: Secondary | ICD-10-CM | POA: Diagnosis not present

## 2015-01-22 DIAGNOSIS — Z5111 Encounter for antineoplastic chemotherapy: Secondary | ICD-10-CM | POA: Diagnosis not present

## 2015-01-22 DIAGNOSIS — G620 Drug-induced polyneuropathy: Secondary | ICD-10-CM

## 2015-01-22 DIAGNOSIS — Z86718 Personal history of other venous thrombosis and embolism: Secondary | ICD-10-CM

## 2015-01-22 LAB — CBC WITH DIFFERENTIAL/PLATELET
BASO%: 0.4 % (ref 0.0–2.0)
BASOS ABS: 0 10*3/uL (ref 0.0–0.1)
EOS%: 1.9 % (ref 0.0–7.0)
Eosinophils Absolute: 0.1 10*3/uL (ref 0.0–0.5)
HEMATOCRIT: 32.8 % — AB (ref 34.8–46.6)
HGB: 10.3 g/dL — ABNORMAL LOW (ref 11.6–15.9)
LYMPH%: 13.3 % — ABNORMAL LOW (ref 14.0–49.7)
MCH: 26.8 pg (ref 25.1–34.0)
MCHC: 31.5 g/dL (ref 31.5–36.0)
MCV: 85.1 fL (ref 79.5–101.0)
MONO#: 0.4 10*3/uL (ref 0.1–0.9)
MONO%: 8.6 % (ref 0.0–14.0)
NEUT#: 3.5 10*3/uL (ref 1.5–6.5)
NEUT%: 75.8 % (ref 38.4–76.8)
Platelets: 100 10*3/uL — ABNORMAL LOW (ref 145–400)
RBC: 3.85 10*6/uL (ref 3.70–5.45)
RDW: 23.6 % — ABNORMAL HIGH (ref 11.2–14.5)
WBC: 4.6 10*3/uL (ref 3.9–10.3)
lymph#: 0.6 10*3/uL — ABNORMAL LOW (ref 0.9–3.3)

## 2015-01-22 LAB — COMPREHENSIVE METABOLIC PANEL (CC13)
ALT: 18 U/L (ref 0–55)
AST: 30 U/L (ref 5–34)
Albumin: 3 g/dL — ABNORMAL LOW (ref 3.5–5.0)
Alkaline Phosphatase: 182 U/L — ABNORMAL HIGH (ref 40–150)
Anion Gap: 7 mEq/L (ref 3–11)
BUN: 7.6 mg/dL (ref 7.0–26.0)
CALCIUM: 8.7 mg/dL (ref 8.4–10.4)
CHLORIDE: 110 meq/L — AB (ref 98–109)
CO2: 25 mEq/L (ref 22–29)
Creatinine: 0.6 mg/dL (ref 0.6–1.1)
Glucose: 122 mg/dl (ref 70–140)
POTASSIUM: 4.2 meq/L (ref 3.5–5.1)
SODIUM: 142 meq/L (ref 136–145)
Total Bilirubin: 0.63 mg/dL (ref 0.20–1.20)
Total Protein: 5.9 g/dL — ABNORMAL LOW (ref 6.4–8.3)

## 2015-01-22 MED ORDER — HEPARIN SOD (PORK) LOCK FLUSH 100 UNIT/ML IV SOLN
500.0000 [IU] | Freq: Once | INTRAVENOUS | Status: AC | PRN
  Administered 2015-01-22: 500 [IU]
  Filled 2015-01-22: qty 5

## 2015-01-22 MED ORDER — PEGFILGRASTIM 6 MG/0.6ML ~~LOC~~ PSKT
6.0000 mg | PREFILLED_SYRINGE | Freq: Once | SUBCUTANEOUS | Status: AC
  Administered 2015-01-22: 6 mg via SUBCUTANEOUS
  Filled 2015-01-22: qty 0.6

## 2015-01-22 MED ORDER — SODIUM CHLORIDE 0.9 % IJ SOLN
10.0000 mL | INTRAMUSCULAR | Status: DC | PRN
  Administered 2015-01-22: 10 mL
  Filled 2015-01-22: qty 10

## 2015-01-22 MED ORDER — SODIUM CHLORIDE 0.9 % IV SOLN
Freq: Once | INTRAVENOUS | Status: AC
  Administered 2015-01-22: 10:00:00 via INTRAVENOUS

## 2015-01-22 MED ORDER — PACLITAXEL PROTEIN-BOUND CHEMO INJECTION 100 MG
100.0000 mg/m2 | Freq: Once | INTRAVENOUS | Status: AC
  Administered 2015-01-22: 175 mg via INTRAVENOUS
  Filled 2015-01-22: qty 35

## 2015-01-22 MED ORDER — SODIUM CHLORIDE 0.9 % IV SOLN
Freq: Once | INTRAVENOUS | Status: AC
  Administered 2015-01-22: 10:00:00 via INTRAVENOUS
  Filled 2015-01-22: qty 4

## 2015-01-22 NOTE — Progress Notes (Signed)
ID: [name]   DOB: [birth year]  MR#: [id number]  TDD#:[id number]  PCP: Rhonda Pear., NP GYN:  SU: [facility] OTHER WC:BJSE[facility] [name], [name], [name]  CHIEF COMPLAINT:  Estrogen receptor positive Breast Cancer; Ovarian cancer; DVT  CURRENT TREATMENT: Letrozole; abraxane; lovenox   BREAST CANCER HISTORY: From the original consult note:  [name] noted a mass in her right breast mid December 2013, and as it did not spontaneously resolve over a couple of weeks she brought it to her primary physician's attention. She was set up for diagnostic mammography and right breast ultrasonography at the breast Center 05/10/2012. (Note that the patient's most recent prior mammography had been in October 2008). The current study showed a spiculated mass in the superior subareolar portion of the right breast measuring approximately 5 cm and associated with pleomorphic calcifications. This was firm and palpable. There was right nipple retraction and skin thickening. Ultrasound confirmed an irregularly marginated hypoechoic mass measuring 3.5 cm by ultrasound, and an abnormal appearing lower right axillary lymph node measuring 2.6 cm.  Biopsies of both the breast mass and the abnormal appearing lymph node were performed 05/21/2012. Both showed an invasive ductal carcinoma, grade 2, with similar prognostic panels (the breast mass was 100% estrogen and 73% progesterone receptor positive, with an MIB-1 of 5%; the lymph node was 100% estrogen 100% progesterone receptor positive, with an MIB-1 of 20%). Both masses were HER-2 negative.  Breast MRI obtained at [facility] imaging 05/29/2012 confirmed a dominant mass in the retroareolar right breast measuring 4.4 cm maximally. There was a satellite nodule inferior and lateral to this mass, measuring 1.7 cm. There were no other masses in either breast. Aside from the previously biopsied lymph node there were other mildly  enhancing level I right axillary lymph nodes which did not appear pathologic. There was no other lymphadenopathy noted.   The patient's subsequent history is as detailed below.  OVARIAN CANCER HISTORY: From the 06/16/2014 summary note:  "[name] returns today for review of her restaging studies accompanied by her husband [name]. To summarize her recent history: She was admitted to [facility] with a diagnosis of possible pneumonia in late December 2015. Chest x-ray showed some increased density in the right upper lobe. CT scan was suggested and was obtained by Dr. Inda [name]. This showed 2 small nodules in the right middle lobe, measuring 8 and 4 mm respectively. Dr. Inda [name] then contacted [name] for further evaluation and we set [name] up for a PET scan and which was performed late January. This showed the 2 nodules in the lung not to be hypermetabolic. This of course does not prove that they are not malignant. More importantly, there was a 13.7 cm cystic/solid mass in the upper pelvis associated with adenopathy,. All of this was hypermetabolic."  The patient was evaluated by gynecologic oncology and on 06/27/2014 she underwent surgery under Dr. Denman [name]. This consisted of an exploratory laparotomy with hysterectomy and abdominal salpingo-oophorectomy, as well as tumor debulking and omentectomy. 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 The tumor measured 11.5 cm, focally involve the ovarian capsule on the left; the uterus and right ovary and fallopian tube were unremarkable. One para-aortic lymph node was positive out of a total of 6 lymph nodes sampled (2 left common iliac, 2 left para-aortic, and to within the omental resection).  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 Today is  day 15 cycle 4 (dose #8) of abraxane given every 2 weeks. She continues to tolerate this well with just a few complaints.   REVIEW OF SYSTEMS: [name] denies fevers or chills. She has some queeziness after treatment, but never vomits. She is moving her bowels well. She continues to maintain her weight and has no trouble eating. She is bruising more than usual. She denies shortness of breath, chest pain, cough, or palpitations. Her main complaint with treatment is fatigue that can last for half a week. The numbness to her 4th and 5th finger of her left hand is no worse. A detailed review of system is otherwise stable.  PAST MEDICAL HISTORY: Past Medical History  Diagnosis Date  . Depression   . DVT (deep venous thrombosis)     hx of on HRT left leg ~2006  . GERD (gastroesophageal reflux disease)   . Hyperlipidemia   . Hypertension   . Hypothyroidism   . PPD positive, treated     rx inh   . Diabetes mellitus   . 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 DJD (degenerative joint disease) of lumbar spine   . Chronic combined systolic and diastolic CHF (congestive heart failure)     a. 08/2012 Echo: EF 55-60%, no rwma, mod MR;  b. 07/2014 Echo: EF 45-50%, distal antsept HK, mod TR/MR, mildly bil-atrial enlargement.  . Paroxysmal atrial fibrillation     a. CHA2DS2VASc = 4-->coumadin.  [name] Physical deconditioning   . Allergy   . Anemia     low iron hx  . Cataract     removed ou  . OSA on CPAP     cpap 4.5 setting  . Breast cancer 2014    a. 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 Clear Cell Ovarian Cancer     a. 06/2014 s/p TAH/BSO debulking/lymph node dissection;  b. Now on chemo.    PAST SURGICAL HISTORY: Past Surgical History  Procedure Laterality Date  . Tubal ligation    . Cholecystectomy    . Foot surgery    . Eye surgery    . Cataract extraction    .  Percutaneous liver biopsy    . Breast biopsy      left breast  . Mastectomy modified radical  06/08/2012    Procedure: MASTECTOMY MODIFIED RADICAL;  Surgeon: Edward Jolly, MD;  Location: [place];  Service: General;  Laterality: Right;  . Portacath placement  06/08/2012    Procedure: INSERTION PORT-A-CATH;  Surgeon: Edward Jolly, MD;  Location: [place];  Service: General;  Laterality: Left;  . Peg tube placement      peg removed 2015  . History of chemotherapy x 2 treatments, radiation tx  2014  . Pac removed  2015  . Laparotomy N/A 06/27/2014    Procedure: EXPLORATORY LAPAROTOMY;  Surgeon: Everitt Amber, MD;  Location: [facility];  Service: Gynecology;  Laterality: N/A;  . Abdominal hysterectomy N/A 06/27/2014    Procedure: HYSTERECTOMY ABDOMINAL TOTAL;  Surgeon: Everitt Amber, MD;  Location: [facility];  Service: Gynecology;  Laterality: N/A;  . Salpingoophorectomy Bilateral 06/27/2014    Procedure: BILATERAL SALPINGO OOPHORECTOMY/TUMOR DEBULKING/LYMPHNODE DISSECTION, OMENTECTOMY;  Surgeon: Everitt Amber, MD;  Location: [facility];  Service: Gynecology;  Laterality: Bilateral;  FAMILY HISTORY Family History  Problem Relation Age of Onset  . Diabetes Mother   . Hypertension Mother   . Arthritis Mother   . Heart disease Mother   . Heart failure Mother   . Other Mother     benign breast mass  . Stroke Father   . Heart disease Father   . Dementia Father   . Diabetes Paternal Grandmother   . Colon cancer Paternal Grandfather     dx. 70s-80s  . Other Daughter     potentially has had negative BRCA testing  . Other Daughter     hysterectomy; potentially has had negative genetic testing   the patient's father died in his 55s with a history of dementia. He had had prior strokes. The patient's mother died in her 66s, with a history of congestive heart failure. [name] had no brothers, one sister. There is no history of breast or ovarian cancer in the family.  GYNECOLOGIC HISTORY: Menarche age 55,  first live birth age 89, she is GX P2, menopause approximately 15 years ago, on hormone replacement until 2010.  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 Her husband [name] works for [employer]. Daughter Paul Half is a Physiological scientist and lives in [place]. Daughter Santiago Bur and her family (husband and 2 children aged 64 and 3 years) currently live with the patient. Makayli is a member of a [employer].   ADVANCED DIRECTIVES: Not in place  HEALTH MAINTENANCE: (Updated October 2014) Social History  Substance Use Topics  . Smoking status: Former Smoker -- 1.00 packs/day for 1 years  . Smokeless tobacco: Never Used  . Alcohol Use: No     Colonoscopy: Never  PAP: Does not recall  Bone density: Never  Lipid panel:   Allergies  Allergen Reactions  . Olmesartan Medoxomil Cough    REACTION: ? if cough  . Venlafaxine Other (See Comments)    REACTION: severe dry mouth  . Adhesive [Tape] Rash  . Tetracycline Hcl Other (See Comments)    Unknown reaction, too long for patient to remember     Current Outpatient Prescriptions  Medication Sig Dispense Refill  . ALPRAZolam (XANAX XR) 1 MG 24 hr tablet Take 1 tablet (1 mg total) by mouth 2 (two) times daily. 240 tablet 0  . buPROPion (WELLBUTRIN XL) 150 MG 24 hr tablet Take 1 tablet (150 mg total) by mouth daily. 90 tablet 0  . clonazePAM (KLONOPIN) 0.5 MG tablet TAKE 1/2 TABLET BY MOUTH TWICE DAILY AS NEEDED 30 tablet 0  . docusate sodium (COLACE) 100 MG capsule Take 1 capsule (100 mg total) by mouth 2 (two) times daily. (Patient taking differently: Take 100 mg by mouth 2 (two) times daily as needed. ) 20 capsule 0  . enoxaparin (LOVENOX) 100 MG/ML injection Inject 1 mL (100 mg total) into the skin daily. 90 Syringe 2  . escitalopram (LEXAPRO) 20 MG tablet Take 20 mg by mouth daily.    . famotidine (PEPCID) 20 MG  tablet Take 1 tablet (20 mg total) by mouth daily. 30 tablet 0  . furosemide (LASIX) 20 MG tablet Take 1 tablet (20 mg total) by mouth daily. 90 tablet 1  . letrozole (FEMARA) 2.5 MG tablet Take 1 tablet (2.5 mg total) by mouth daily. 90 tablet 0  . levothyroxine (SYNTHROID, LEVOTHROID) 150 MCG tablet Take 1 tablet (150 mcg total) by mouth daily. [place]  tablet 1  . lidocaine-prilocaine (EMLA) cream Apply over port area 1-2 hours before chemotherapy 30 g 0  . metoprolol (LOPRESSOR) 50 MG tablet Take 1 tablet (50 mg total) by mouth 2 (two) times daily. 180 tablet 1  . Nutritional Supplements (ENSURE HIGH PROTEIN PO) Take 237 mLs by mouth 2 (two) times daily.    [name] OLANZapine (ZYPREXA) 2.5 MG tablet Take 2.5 mg by mouth at bedtime.    . ondansetron (ZOFRAN) 8 MG tablet Take 1 tablet (8 mg total) by mouth 2 (two) times daily. Start the day after chemo for 2 days. Then take as needed for nausea or vomiting. 30 tablet 1  . potassium chloride (K-DUR) 10 MEQ tablet Take 1 tablet (10 mEq total) by mouth 2 (two) times daily. 180 tablet 1  . prochlorperazine (COMPAZINE) 10 MG tablet Take 1 tablet (10 mg total) by mouth every 6 (six) hours as needed (Nausea or vomiting). 30 tablet 1   No current facility-administered medications for this visit.    OBJECTIVE: Middle-aged white woman who appears stated age 61 Vitals:   01/22/15 0843  BP: 114/53  Pulse: 75  Temp: 97.5 F (36.4 C)  Resp: 18     Body mass index is 28.02 kg/(m^2).    ECOG FS: 1 Filed Weights   01/22/15 0843  Weight: 163 lb 4.8 oz (74.072 kg)   Skin: warm, dry  HEENT: sclerae anicteric, conjunctivae pink, oropharynx clear. No thrush or mucositis.  Lymph Nodes: No cervical or supraclavicular lymphadenopathy  Lungs: clear to auscultation bilaterally, no rales, wheezes, or rhonci  Heart: regular rate and rhythm  Abdomen: round, soft, non tender, positive bowel sounds  Musculoskeletal: No focal spinal tenderness, no peripheral edema   Neuro: non focal, well oriented, positive affect  Breasts: deferred    Lab Results  Component Value Date   WBC 4.6 01/22/2015   NEUTROABS 3.5 01/22/2015   HGB 10.3* 01/22/2015   HCT 32.8* 01/22/2015   MCV 85.1 01/22/2015   PLT 100* 01/22/2015   Results for [name], [name] (MRN [id number]) as of 01/08/2015 10:15  Ref. Range 10/06/2014 07:53 10/27/2014 09:24 11/16/2014 10:54 11/24/2014 07:49 12/25/2014 09:55  CA 125 Latest Ref Range: <35 U/mL 113 (H) 123 (H) 86 (H) 93 (H) 88 (H)      Chemistry      Component Value Date/Time   NA 142 01/22/2015 0803   NA 137 12/15/2014 0947   K 4.2 01/22/2015 0803   K 3.8 12/15/2014 0947   CL 103 12/15/2014 0947   CL 104 10/29/2012 1058   CO2 25 01/22/2015 0803   CO2 28 12/15/2014 0947   BUN 7.6 01/22/2015 0803   BUN 10 12/15/2014 0947   CREATININE 0.6 01/22/2015 0803   CREATININE 0.40 12/15/2014 0947   CREATININE 0.69 11/10/2013 0828      Component Value Date/Time   CALCIUM 8.7 01/22/2015 0803   CALCIUM 8.5 12/15/2014 0947   ALKPHOS 182* 01/22/2015 0803   ALKPHOS 152* 08/18/2014 1320   AST 30 01/22/2015 0803   AST 40* 08/18/2014 1320   ALT 18 01/22/2015 0803   ALT 20 08/18/2014 1320   BILITOT 0.63 01/22/2015 0803   BILITOT 0.8 08/18/2014 1320     STUDIES: No results found.  ASSESSMENT: 59 y.o.  BRCA negative [name]   (1)  status post right mastectomy  06/08/2012 for a pT3, pN1a, stage IIIA invasive ductal carcinoma, grade 2,  estrogen and progesterone receptor positive, HER-2/neu negative, with an MIB-1 of 20%.    (  2)  treated in the adjuvant setting with docetaxel/ cyclophosphamide given every 3 weeks. There were multiple and severe complications, and the  patient tolerated only two cycles, last dose 07/29/2012  (3) letrozole started 08/16/2012  (a) osteopenia, with a T score of -1.14 April 2013  (4) postmastectomy radiation completed 12/24/2012  (5)  comorbidities include diabetes, hypertension, chronic liver  disease with cirrhosis and fatty liver, and nutritional disturbance with temporarily dependence on PEG feeds (discontinued July 2014).  (6) restaging studies January 2016 show  (a) 8 mm and 4 mm Right middle lobe lung nodules, not metabolically active on PET -- Rhonda require follow-up  (b) hypermetabolic 65.9 cm mixed solid/cystic lesion in upper pelvis, with associated adenopathy  (7) status post exploratory laparotomy 06/27/2014 with total abdominal hysterectomy, bilateral salpingo-oophorectomy, para-aortic lymphadenectomy, omentectomy and radical tumor debulking for a clear cell ovarian cancer, pT1c pN1, stage IIIC .  (8) adjuvant chemotherapy consisting of carboplatin and paclitaxel given weekly days 1 and 8 of each 21 day cycle, for 6-8 cycles as tolerated, started 07/17/2014, with onpro support  (a) day 8 cycle 2 omitted and further treatments cancelled because of an episode of Klebsiella sepsis requiring intensive care unit admission  (9) bilateral lower extremity DVTs documented 09/25/2014 despite being on Coumadin for PAF (subtherapeutic INR) with mobile Right common femoral clot  (a) status post thrombolysis 09/30/2014  (b) on Lovenox daily as of 10/02/2014  (c) IVC filter placed 10/26/2014 as mobile clot still noted Right groin  (d) IVC filter to be removed 01/30/2015 if repeat dopplers before that allow it  (10) started abraxane 10/13/2014, to be repeated every 14 days for 6 cycles (12 doses)  (11) genetics testing December 04, 2014 through the Breast[facility]Ovarian Cancer Panel [facility] no deleterious mutations in ATM, BARD1, BRCA1, BRCA2, BRIP1, CDH1, CHEK2, FANCC, MLH1, MSH2, MSH6, NBN, PALB2, PMS2, PTEN, RAD51C, RAD51D, STK11, TP53, and XRCC2.    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   PLAN: [name] continues to tolerate treatment well. The labs were reviewed in detail. Her platelet count is down to 100  today, which is the likely cause of increased bruising. She meets with Dr. Anselm Pancoast next week to have her IVC filters removed. She Rhonda hold the lovenox a few days before this procedure. The rest of the labs were stable, and her platelet count is sufficient enough to proceed with treatment as planned today.   [name] Rhonda return in 2 weeks for the start of cycle 5 of abraxane. She understands and agrees with this plan. She has been encouraged to call with any issues that might arise before her next visit here.   Laurie Panda, NP    01/22/2015

## 2015-01-22 NOTE — Progress Notes (Signed)
Spiritual Care Note  Followed up with [name] in infusion room, providing general emotional support as well as bereavement care, as family is grieving deaths of three dogs (mom and two puppies), who have been central sources of meaning and joy.  Per pt, she is looking forward to traveling to [facility] next month for a family wedding; this is a source of meaning, motivation, and anticipated break (fun).  Served as witness to her story and processing, offering reflective listening and pastoral reflection.  Pt aware of ongoing chaplain availability, but please also page as needs arise.  Thank you.  [place], [place] Pager [phone] Voicemail  [phone]

## 2015-01-22 NOTE — Patient Instructions (Signed)
Nanoparticle Albumin-Bound Paclitaxel injection  What is this medicine?  NANOPARTICLE ALBUMIN-BOUND PACLITAXEL (Na no PAHR ti kuhl al BYOO muhn-bound PAK li TAX el) is a chemotherapy drug. It targets fast dividing cells, like cancer cells, and causes these cells to die. This medicine is used to treat advanced breast cancer and advanced lung cancer.  This medicine may be used for other purposes; ask your health care provider or pharmacist if you have questions.  COMMON BRAND NAME(S): Abraxane  What should I tell my health care provider before I take this medicine?  They need to know if you have any of these conditions:  -kidney disease  -liver disease  -low blood counts, like low platelets, red blood cells, or white blood cells  -recent or ongoing radiation therapy  -an unusual or allergic reaction to paclitaxel, albumin, other chemotherapy, other medicines, foods, dyes, or preservatives  -pregnant or trying to get pregnant  -breast-feeding  How should I use this medicine?  This drug is given as an infusion into a vein. It is administered in a hospital or clinic by a specially trained health care professional.  Talk to your pediatrician regarding the use of this medicine in children. Special care may be needed.  Overdosage: If you think you have taken too much of this medicine contact a poison control center or emergency room at once.  NOTE: This medicine is only for you. Do not share this medicine with others.  What if I miss a dose?  It is important not to miss your dose. Call your doctor or health care professional if you are unable to keep an appointment.  What may interact with this medicine?  -cyclosporine  -diazepam  -ketoconazole  -medicines to increase blood counts like filgrastim, pegfilgrastim, sargramostim  -other chemotherapy drugs like cisplatin, doxorubicin, epirubicin, etoposide, teniposide, vincristine  -quinidine  -testosterone  -vaccines  -verapamil  Talk to your doctor or health care professional  before taking any of these medicines:  -acetaminophen  -aspirin  -ibuprofen  -ketoprofen  -naproxen  This list may not describe all possible interactions. Give your health care provider a list of all the medicines, herbs, non-prescription drugs, or dietary supplements you use. Also tell them if you smoke, drink alcohol, or use illegal drugs. Some items may interact with your medicine.  What should I watch for while using this medicine?  Your condition will be monitored carefully while you are receiving this medicine. You will need important blood work done while you are taking this medicine.  This drug may make you feel generally unwell. This is not uncommon, as chemotherapy can affect healthy cells as well as cancer cells. Report any side effects. Continue your course of treatment even though you feel ill unless your doctor tells you to stop.  In some cases, you may be given additional medicines to help with side effects. Follow all directions for their use.  Call your doctor or health care professional for advice if you get a fever, chills or sore throat, or other symptoms of a cold or flu. Do not treat yourself. This drug decreases your body's ability to fight infections. Try to avoid being around people who are sick.  This medicine may increase your risk to bruise or bleed. Call your doctor or health care professional if you notice any unusual bleeding.  Be careful brushing and flossing your teeth or using a toothpick because you may get an infection or bleed more easily. If you have any dental work done, tell your   dentist you are receiving this medicine.  Avoid taking products that contain aspirin, acetaminophen, ibuprofen, naproxen, or ketoprofen unless instructed by your doctor. These medicines may hide a fever.  Do not become pregnant while taking this medicine. Women should inform their doctor if they wish to become pregnant or think they might be pregnant. There is a potential for serious side effects to  an unborn child. Talk to your health care professional or pharmacist for more information. Do not breast-feed an infant while taking this medicine.  Men are advised not to father a child while receiving this medicine.  What side effects may I notice from receiving this medicine?  Side effects that you should report to your doctor or health care professional as soon as possible:  -allergic reactions like skin rash, itching or hives, swelling of the face, lips, or tongue  -low blood counts - This drug may decrease the number of white blood cells, red blood cells and platelets. You may be at increased risk for infections and bleeding.  -signs of infection - fever or chills, cough, sore throat, pain or difficulty passing urine  -signs of decreased platelets or bleeding - bruising, pinpoint red spots on the skin, black, tarry stools, nosebleeds  -signs of decreased red blood cells - unusually weak or tired, fainting spells, lightheadedness  -breathing problems  -changes in vision  -chest pain  -high or low blood pressure  -mouth sores  -nausea and vomiting  -pain, swelling, redness or irritation at the injection site  -pain, tingling, numbness in the hands or feet  -slow or irregular heartbeat  -swelling of the ankle, feet, hands  Side effects that usually do not require medical attention (report to your doctor or health care professional if they continue or are bothersome):  -aches, pains  -changes in the color of fingernails  -diarrhea  -hair loss  -loss of appetite  This list may not describe all possible side effects. Call your doctor for medical advice about side effects. You may report side effects to FDA at 1-800-FDA-1088.  Where should I keep my medicine?  This drug is given in a hospital or clinic and will not be stored at home.  NOTE: This sheet is a summary. It may not cover all possible information. If you have questions about this medicine, talk to your doctor, pharmacist, or health care provider.  © 2015,  [employer]. (2012-06-21 16:48:50)

## 2015-01-23 ENCOUNTER — Telehealth: Payer: Self-pay | Admitting: *Deleted

## 2015-01-23 ENCOUNTER — Other Ambulatory Visit: Payer: Self-pay | Admitting: Family

## 2015-01-23 MED ORDER — ALPRAZOLAM ER 1 MG PO TB24: 1.0000 mg | ORAL_TABLET | Freq: Two times a day (BID) | ORAL | Status: DC

## 2015-01-23 NOTE — Telephone Encounter (Signed)
Called pharmacy after spouse called reporting she is using her last week of alprazolam.  "The 10-23-2014 quantity dispensed = 180 pills which was adjusted based on directions of twice daily."  [facility].

## 2015-01-23 NOTE — Telephone Encounter (Signed)
Rx called to pharmacist as below. 

## 2015-01-23 NOTE — Telephone Encounter (Signed)
Spoke with pt's spouse and advised him that [name] is not able to provide 90 day supply of controlled substances, can continue prescribing 30 day supply at a time. Upon review of EPIC it appears that Dr Jana Hakim gave pt a 90 day supply in 10/2014. He states he will request 90 day supply from Dr Jana Hakim. Advised him to let us know if Dr Jana Hakim will not be able to provide refills going forward and he voices understanding.

## 2015-01-23 NOTE — Telephone Encounter (Signed)
Called Mr. Secilia Apps.  Reports "putting Bellarose's medicines together with one week left of alprazolam.  [facility] [id number] says refill was denied.  Do they not want her taking this anymore?"  Refill denied was based on Quantity.  Mr. Riesen reads bottle that reads "quantity of one hundred-eighty.  She takes this twice daily."    Called Pharmacy, confirmed "quantity dispensed = 180 pills which was adjusted based on directions."  Representative asked Is there a reason the doctor ordered 240 which is more than a 90-day supply?   Will notify Mr. Humbarger.

## 2015-01-23 NOTE — Addendum Note (Signed)
Addended by: Cherylynn Ridges on: 01/23/2015 10:18 AM   Modules accepted: Orders

## 2015-01-23 NOTE — Telephone Encounter (Signed)
Ok to send 30 tabs zero refills. 

## 2015-01-30 ENCOUNTER — Ambulatory Visit
Admission: RE | Admit: 2015-01-30 | Discharge: 2015-01-30 | Disposition: A | Discharge status: Home / Self Care | Payer: BLUE CROSS/BLUE SHIELD | Source: Ambulatory Visit | Attending: Diagnostic Radiology | Admitting: Diagnostic Radiology

## 2015-01-30 ENCOUNTER — Other Ambulatory Visit (HOSPITAL_COMMUNITY): Payer: Self-pay | Admitting: Diagnostic Radiology

## 2015-01-30 DIAGNOSIS — I82403 Acute embolism and thrombosis of unspecified deep veins of lower extremity, bilateral: Secondary | ICD-10-CM

## 2015-01-30 NOTE — H&P (Signed)
Referring Physician(s): Dr. Jana Hakim   Chief Complaint: The patient is seen in follow up today s/p retrievable IVC filter placed 10/26/14  History of present illness: [name] is a 59 year old female with history significant for breast cancer and ovarian cancer, she follows with Dr. Jana Hakim. 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 She remains on Lovenox injections daily and wears her lower extremity compression stockings as directed. She is ambulating well without difficulty and denies any new or worsening lower extremity edema. She does admit to some calf pain bilaterally after walking long distances. She denies any chest pain or shortness of breath. She denies any active bleeding. She is here today to discuss IVC filter removal and has had a lower extremity venous duplex performed on 01/16/15.   Past Medical History  Diagnosis Date  . Depression   . DVT (deep venous thrombosis)     hx of on HRT left leg ~2006  . GERD (gastroesophageal reflux disease)   . Hyperlipidemia   . Hypertension   . Hypothyroidism   . PPD positive, treated     rx inh   . Diabetes mellitus   . 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 DJD (degenerative joint disease) of lumbar spine   . Chronic combined systolic and diastolic CHF (congestive heart failure)     a. 08/2012 Echo: EF 55-60%, no rwma, mod MR;  b. 07/2014 Echo: EF 45-50%, distal antsept HK, mod TR/MR, mildly bil-atrial enlargement.  . Paroxysmal atrial fibrillation     a. CHA2DS2VASc = 4-->coumadin.  [name] Physical deconditioning   . Allergy   . Anemia     low iron hx  . Cataract     removed ou  . OSA on CPAP     cpap 4.5 setting  . Breast cancer 2014    a. 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 Clear Cell Ovarian Cancer     a. 06/2014 s/p TAH/BSO debulking/lymph node dissection;  b. Now on chemo.    Past Surgical History  Procedure Laterality Date  . Tubal ligation    . Cholecystectomy    . Foot surgery    . Eye surgery    . Cataract extraction    . Percutaneous liver biopsy    . Breast biopsy      left breast  . Mastectomy modified radical  06/08/2012    Procedure: MASTECTOMY MODIFIED RADICAL;  Surgeon: Edward Jolly, MD;  Location: [facility];  Service: General;  Laterality: Right;  . Portacath placement  06/08/2012    Procedure: INSERTION PORT-A-CATH;  Surgeon: Edward Jolly, MD;  Location: [facility];  Service: General;  Laterality: Left;  . Peg tube placement      peg removed 2015  . History of chemotherapy x 2 treatments, radiation tx  2014  . Pac removed  2015  . Laparotomy N/A 06/27/2014    Procedure: EXPLORATORY LAPAROTOMY;  Surgeon: Everitt Amber, MD;  Location: [facility];  Service: Gynecology;  Laterality: N/A;  . Abdominal hysterectomy N/A 06/27/2014    Procedure: HYSTERECTOMY ABDOMINAL TOTAL;  Surgeon: Everitt Amber, MD;  Location: [facility];  Service: Gynecology;  Laterality: N/A;  . Salpingoophorectomy Bilateral 06/27/2014  Procedure: BILATERAL SALPINGO OOPHORECTOMY/TUMOR DEBULKING/LYMPHNODE DISSECTION, OMENTECTOMY;  Surgeon: Everitt Amber, MD;  Location: [facility];  Service: Gynecology;  Laterality: Bilateral;    Allergies: Olmesartan medoxomil; Adhesive; Tetracycline hcl; and Venlafaxine  Medications: Prior to Admission medications   Medication Sig Start Date End Date Taking? Authorizing Provider  ALPRAZolam (XANAX XR) 1 MG 24 hr tablet Take 1 tablet (1 mg total) by mouth 2 (two) times daily. 01/23/15   Chauncey Cruel, MD  buPROPion (WELLBUTRIN XL) 150 MG 24 hr tablet Take 1 tablet (150 mg total) by mouth daily. 11/06/14   Debbrah Alar, NP  clonazePAM (KLONOPIN) 0.5 MG tablet TAKE 1/2 TABLET BY MOUTH TWICE DAILY AS NEEDED 01/23/15    Debbrah Alar, NP  docusate sodium (COLACE) 100 MG capsule Take 1 capsule (100 mg total) by mouth 2 (two) times daily. Patient taking differently: Take 100 mg by mouth 2 (two) times daily as needed.  10/02/14   Barton Dubois, MD  enoxaparin (LOVENOX) 100 MG/ML injection Inject 1 mL (100 mg total) into the skin daily. 11/03/14   Chauncey Cruel, MD  escitalopram (LEXAPRO) 20 MG tablet Take 20 mg by mouth daily.    Historical Provider, MD  famotidine (PEPCID) 20 MG tablet Take 1 tablet (20 mg total) by mouth daily. 08/08/14   Modena Jansky, MD  furosemide (LASIX) 20 MG tablet Take 1 tablet (20 mg total) by mouth daily. 12/15/14   Debbrah Alar, NP  letrozole [facility]) 2.5 MG tablet Take 1 tablet (2.5 mg total) by mouth daily. 05/22/14   Chauncey Cruel, MD  levothyroxine (SYNTHROID, LEVOTHROID) 150 MCG tablet Take 1 tablet (150 mcg total) by mouth daily. 12/15/14   Debbrah Alar, NP  lidocaine-prilocaine (EMLA) cream Apply over port area 1-2 hours before chemotherapy 07/14/14   Chauncey Cruel, MD  metoprolol (LOPRESSOR) 50 MG tablet Take 1 tablet (50 mg total) by mouth 2 (two) times daily. 08/25/14   Debbrah Alar, NP  Nutritional Supplements (ENSURE HIGH PROTEIN PO) Take 237 mLs by mouth 2 (two) times daily.    Historical Provider, MD  OLANZapine (ZYPREXA) 2.5 MG tablet Take 2.5 mg by mouth at bedtime.    Historical Provider, MD  ondansetron (ZOFRAN) 8 MG tablet Take 1 tablet (8 mg total) by mouth 2 (two) times daily. Start the day after chemo for 2 days. Then take as needed for nausea or vomiting. 10/13/14   Laurie Panda, NP  potassium chloride (K-DUR) 10 MEQ tablet Take 1 tablet (10 mEq total) by mouth 2 (two) times daily. 12/26/14   Debbrah Alar, NP  prochlorperazine (COMPAZINE) 10 MG tablet Take 1 tablet (10 mg total) by mouth every 6 (six) hours as needed (Nausea or vomiting). 09/25/14   Chauncey Cruel, MD     Family History  Problem Relation Age of Onset  .  Diabetes Mother   . Hypertension Mother   . Arthritis Mother   . Heart disease Mother   . Heart failure Mother   . Other Mother     benign breast mass  . Stroke Father   . Heart disease Father   . Dementia Father   . Diabetes Paternal Grandmother   . Colon cancer Paternal Grandfather     dx. 70s-80s  . Other Daughter     potentially has had negative BRCA testing  . Other Daughter     hysterectomy; potentially has had negative genetic testing    Social History   Social History  . Marital Status: Married  Spouse Name: N/A  . Number of Children: 2  . Years of Education: N/A   Occupational History  . [place] History Main Topics  . Smoking status: Former Smoker -- 1.00 packs/day for 1 years  . Smokeless tobacco: Never Used  . Alcohol Use: No  . Drug Use: No     Comment: quit age 26 only smoked as a teen 1 year  . Sexual Activity: No   Other Topics Concern  . None   Social History Narrative   Married   Works at [employer] secretary   Daily caffeine use - 2 cups a day plus a couple of sodas a day   Pt doesn't exercise regularly   G2P2   H H of 5 soon to be 6 .     Vital Signs: BP 116/61 mmHg  Pulse 64  Temp(Src) 97.6 F (36.4 C)  Resp 14  SpO2 100%  Physical Exam General: A&Ox3, NAD, ambulates well Ext: Warm LE b/l, no edema or pain with palpation b/l, compression stockings present b/l  Imaging: No results found.  Labs:  CBC:  Recent Labs  12/11/14 0926 12/25/14 0955 01/08/15 0905 01/22/15 0803  WBC 3.8* 4.0 5.3 4.6  HGB 10.2* 10.8* 10.6* 10.3*  HCT 31.9* 33.8* 33.3* 32.8*  PLT 154 153 143* 100*    COAGS:  Recent Labs  07/30/14 0430 07/31/14 0500 08/01/14 0434  08/03/14 0450  10/02/14 0415 10/06/14 0753 10/13/14 0829 11/03/14 1022  INR 2.44* 1.90* 1.63*  < > 1.84*  < > 1.46 1.20* 1.20* 1.20*  APTT 35 31 35  --  31  --   --   --   --   --   < > = values in this interval not  displayed.  BMP:  Recent Labs  08/18/14 1320  09/26/14 0358 09/30/14 0408 10/02/14 0415  12/15/14 0947 12/25/14 0955 01/08/15 0905 01/22/15 0803  NA 136  < > 137 138 135  < > 137 142 143 142  K 4.0  < > 3.0* 3.9 3.8  < > 3.8 4.0 4.2 4.2  CL 103  --  108 109 106  --  103  --   --   --   CO2 24  < > 23 20* 20*  < > '28 25 26 25  ' GLUCOSE 72  < > 89 121* 101*  < > 73 160* 112 122  BUN 5*  < > '8 6 6  ' < > 10 6.2* 8.7 7.6  CALCIUM 7.9*  < > 7.8* 8.5* 8.4*  < > 8.5 8.7 9.0 8.7  CREATININE 0.68  < > 0.35* 0.42* 0.45  < > 0.40 0.6 0.6 0.6  GFRNONAA >90  --  >60 >60 >60  --   --   --   --   --   GFRAA >90  --  >60 >60 >60  --   --   --   --   --   < > = values in this interval not displayed.  LIVER FUNCTION TESTS:  Recent Labs  12/11/14 0926 12/25/14 0955 01/08/15 0905 01/22/15 0803  BILITOT 0.76 0.64 0.74 0.63  AST 34 33 32 30  ALT '16 21 20 18  ' ALKPHOS 166* 184* 187* 182*  PROT 5.8* 6.2* 6.2* 5.9*  ALBUMIN 2.8* 3.1* 3.1* 3.0*    Assessment: History of breast cancer and ovarian cancer follows with Dr. Jana Hakim Bilateral DVT on Lovenox without any signs of active  bleeding-compliant with medication and compression stockings History of mobile thrombus in right common femoral vein s/p retrievable IVC filter placed 10/26/14 Follow-up LE venous duplex done 01/16/15 with resolved mobile thrombus in Right CFV. Evidence of subacute DVT right femoral vein and right popliteal vein, Subacute DVT left common femoral vein, left femoral vein and left popliteal vein. Patient will remain on Lovenox and is instructed to continue her compression stockings.   She would like the IVC filter removed at this time and given that her mobile thrombus has resolved and she will remain on anticoagulation we will schedule this as soon as possible. Risks and Benefits discussed with the patient including, but not limited to bleeding, infection, contrast induced renal failure, filter fracture or inability to remove  filter.   All of the patient's questions were answered, patient is agreeable to proceed.   SignedHedy Jacob 01/30/2015, 9:28 AM   Please refer to Dr. Anselm Pancoast attestation of this note for management and plan.

## 2015-02-01 ENCOUNTER — Other Ambulatory Visit: Payer: Self-pay | Admitting: Diagnostic Radiology

## 2015-02-01 DIAGNOSIS — I82409 Acute embolism and thrombosis of unspecified deep veins of unspecified lower extremity: Secondary | ICD-10-CM

## 2015-02-05 ENCOUNTER — Telehealth: Payer: Self-pay | Admitting: Oncology

## 2015-02-05 ENCOUNTER — Other Ambulatory Visit (HOSPITAL_BASED_OUTPATIENT_CLINIC_OR_DEPARTMENT_OTHER): Payer: BLUE CROSS/BLUE SHIELD

## 2015-02-05 ENCOUNTER — Ambulatory Visit (HOSPITAL_BASED_OUTPATIENT_CLINIC_OR_DEPARTMENT_OTHER): Payer: BLUE CROSS/BLUE SHIELD | Admitting: Oncology

## 2015-02-05 ENCOUNTER — Ambulatory Visit (HOSPITAL_BASED_OUTPATIENT_CLINIC_OR_DEPARTMENT_OTHER): Payer: BLUE CROSS/BLUE SHIELD

## 2015-02-05 VITALS — BP 121/50 | HR 69 | Temp 98.3°F | Resp 18 | Ht 64.0 in | Wt 160.7 lb

## 2015-02-05 DIAGNOSIS — C50911 Malignant neoplasm of unspecified site of right female breast: Secondary | ICD-10-CM

## 2015-02-05 DIAGNOSIS — Z5189 Encounter for other specified aftercare: Secondary | ICD-10-CM | POA: Diagnosis not present

## 2015-02-05 DIAGNOSIS — C569 Malignant neoplasm of unspecified ovary: Secondary | ICD-10-CM

## 2015-02-05 DIAGNOSIS — Z5111 Encounter for antineoplastic chemotherapy: Secondary | ICD-10-CM

## 2015-02-05 DIAGNOSIS — I4891 Unspecified atrial fibrillation: Secondary | ICD-10-CM

## 2015-02-05 DIAGNOSIS — K760 Fatty (change of) liver, not elsewhere classified: Secondary | ICD-10-CM

## 2015-02-05 LAB — COMPREHENSIVE METABOLIC PANEL (CC13)
ALBUMIN: 3.1 g/dL — AB (ref 3.5–5.0)
ALK PHOS: 185 U/L — AB (ref 40–150)
ALT: 16 U/L (ref 0–55)
AST: 29 U/L (ref 5–34)
Anion Gap: 6 mEq/L (ref 3–11)
BILIRUBIN TOTAL: 0.52 mg/dL (ref 0.20–1.20)
BUN: 7 mg/dL (ref 7.0–26.0)
CO2: 25 meq/L (ref 22–29)
CREATININE: 0.7 mg/dL (ref 0.6–1.1)
Calcium: 8.6 mg/dL (ref 8.4–10.4)
Chloride: 111 mEq/L — ABNORMAL HIGH (ref 98–109)
EGFR: >90 mL/min/{1.73_m2} (ref >90)
GLUCOSE: 139 mg/dL (ref 70–140)
Potassium: 4.3 mEq/L (ref 3.5–5.1)
SODIUM: 142 meq/L (ref 136–145)
TOTAL PROTEIN: 5.9 g/dL — AB (ref 6.4–8.3)

## 2015-02-05 LAB — CBC WITH DIFFERENTIAL/PLATELET
BASO%: 0.3 % (ref 0.0–2.0)
Basophils Absolute: 0 10*3/uL (ref 0.0–0.1)
EOS%: 1.7 % (ref 0.0–7.0)
Eosinophils Absolute: 0.1 10*3/uL (ref 0.0–0.5)
HCT: 33.7 % — ABNORMAL LOW (ref 34.8–46.6)
HEMOGLOBIN: 10.6 g/dL — AB (ref 11.6–15.9)
LYMPH%: 13.8 % — AB (ref 14.0–49.7)
MCH: 26.8 pg (ref 25.1–34.0)
MCHC: 31.4 g/dL — ABNORMAL LOW (ref 31.5–36.0)
MCV: 85.4 fL (ref 79.5–101.0)
MONO#: 0.3 10*3/uL (ref 0.1–0.9)
MONO%: 6.1 % (ref 0.0–14.0)
NEUT%: 78.1 % — ABNORMAL HIGH (ref 38.4–76.8)
NEUTROS ABS: 3.3 10*3/uL (ref 1.5–6.5)
Platelets: 118 10*3/uL — ABNORMAL LOW (ref 145–400)
RBC: 3.95 10*6/uL (ref 3.70–5.45)
RDW: 23.4 % — AB (ref 11.2–14.5)
WBC: 4.3 10*3/uL (ref 3.9–10.3)
lymph#: 0.6 10*3/uL — ABNORMAL LOW (ref 0.9–3.3)

## 2015-02-05 MED ORDER — SODIUM CHLORIDE 0.9 % IV SOLN
Freq: Once | INTRAVENOUS | Status: AC
  Administered 2015-02-05: 10:00:00 via INTRAVENOUS

## 2015-02-05 MED ORDER — PACLITAXEL PROTEIN-BOUND CHEMO INJECTION 100 MG
100.0000 mg/m2 | Freq: Once | INTRAVENOUS | Status: AC
  Administered 2015-02-05: 175 mg via INTRAVENOUS
  Filled 2015-02-05: qty 35

## 2015-02-05 MED ORDER — ENOXAPARIN SODIUM 100 MG/ML ~~LOC~~ SOLN: 100.0000 mg | SUBCUTANEOUS | Status: DC

## 2015-02-05 MED ORDER — SODIUM CHLORIDE 0.9 % IJ SOLN
10.0000 mL | INTRAMUSCULAR | Status: DC | PRN
  Administered 2015-02-05: 10 mL
  Filled 2015-02-05: qty 10

## 2015-02-05 MED ORDER — HEPARIN SOD (PORK) LOCK FLUSH 100 UNIT/ML IV SOLN
500.0000 [IU] | Freq: Once | INTRAVENOUS | Status: AC | PRN
  Administered 2015-02-05: 500 [IU]
  Filled 2015-02-05: qty 5

## 2015-02-05 MED ORDER — SODIUM CHLORIDE 0.9 % IV SOLN
Freq: Once | INTRAVENOUS | Status: AC
  Administered 2015-02-05: 10:00:00 via INTRAVENOUS
  Filled 2015-02-05: qty 4

## 2015-02-05 MED ORDER — PEGFILGRASTIM 6 MG/0.6ML ~~LOC~~ PSKT
6.0000 mg | PREFILLED_SYRINGE | Freq: Once | SUBCUTANEOUS | Status: AC
  Administered 2015-02-05: 6 mg via SUBCUTANEOUS
  Filled 2015-02-05: qty 0.6

## 2015-02-05 NOTE — Patient Instructions (Signed)
[facility] Discharge Instructions for Patients Receiving Chemotherapy  Today you received the following chemotherapy agents abraxane  To help prevent nausea and vomiting after your treatment, we encourage you to take your nausea medication    If you develop nausea and vomiting that is not controlled by your nausea medication, call the clinic.   BELOW ARE SYMPTOMS THAT SHOULD BE REPORTED IMMEDIATELY:  *FEVER GREATER THAN 100.5 F  *CHILLS WITH OR WITHOUT FEVER  NAUSEA AND VOMITING THAT IS NOT CONTROLLED WITH YOUR NAUSEA MEDICATION  *UNUSUAL SHORTNESS OF BREATH  *UNUSUAL BRUISING OR BLEEDING  TENDERNESS IN MOUTH AND THROAT WITH OR WITHOUT PRESENCE OF ULCERS  *URINARY PROBLEMS  *BOWEL PROBLEMS  UNUSUAL RASH Items with * indicate a potential emergency and should be followed up as soon as possible.  Feel free to call the clinic you have any questions or concerns. The clinic phone number is [phone].  Please show the CHEMO ALERT CARD at check-in to the Emergency Department and triage nurse.   

## 2015-02-05 NOTE — Progress Notes (Signed)
ID: [name]   DOB: 07-31-1955  MR#: [id number]  MCN#:[id number]  PCP: [name], NP GYN:  SU: [facility] OTHER OQ:HUTMLY [name], Shanon Ace, Roney Jaffe  CHIEF COMPLAINT:  Estrogen receptor positive Breast Cancer; Ovarian cancer; DVT  CURRENT TREATMENT: Letrozole; abraxane; lovenox   BREAST CANCER HISTORY: From the original consult note:  [name] noted a mass in her right breast mid December 2013, and as it did not spontaneously resolve over a couple of weeks she brought it to her primary physician's attention. She was set up for diagnostic mammography and right breast ultrasonography at the breast Center 05/10/2012. (Note that the patient's most recent prior mammography had been in October 2008). The current study showed a spiculated mass in the superior subareolar portion of the right breast measuring approximately 5 cm and associated with pleomorphic calcifications. This was firm and palpable. There was right nipple retraction and skin thickening. Ultrasound confirmed an irregularly marginated hypoechoic mass measuring 3.5 cm by ultrasound, and an abnormal appearing lower right axillary lymph node measuring 2.6 cm.  Biopsies of both the breast mass and the abnormal appearing lymph node were performed 05/21/2012. Both showed an invasive ductal carcinoma, grade 2, with similar prognostic panels (the breast mass was 100% estrogen and 73% progesterone receptor positive, with an MIB-1 of 5%; the lymph node was 100% estrogen 100% progesterone receptor positive, with an MIB-1 of 20%). Both masses were HER-2 negative.  Breast MRI obtained at [facility] imaging 05/29/2012 confirmed a dominant mass in the retroareolar right breast measuring 4.4 cm maximally. There was a satellite nodule inferior and lateral to this mass, measuring 1.7 cm. There were no other masses in either breast. Aside from the previously biopsied lymph node there were other mildly  enhancing level I right axillary lymph nodes which did not appear pathologic. There was no other lymphadenopathy noted.   The patient's subsequent history is as detailed below.  OVARIAN CANCER HISTORY: From the 06/16/2014 summary note:  "[name] returns today for review of her restaging studies accompanied by her husband [name]. To summarize her recent history: She was admitted to [facility] with a diagnosis of possible pneumonia in late December 2015. Chest x-ray showed some increased density in the right upper lobe. CT scan was suggested and was obtained by Dr. Inda Castle. This showed 2 small nodules in the right middle lobe, measuring 8 and 4 mm respectively. Dr. Inda Castle then contacted [name] for further evaluation and we set [name] up for a PET scan and which was performed late January. This showed the 2 nodules in the lung not to be hypermetabolic. This of course does not prove that they are not malignant. More importantly, there was a 13.7 cm cystic/solid mass in the upper pelvis associated with adenopathy,. All of this was hypermetabolic."  The patient was evaluated by gynecologic oncology and on 06/27/2014 she underwent surgery under Dr. Denman George. This consisted of an exploratory laparotomy with hysterectomy and abdominal salpingo-oophorectomy, as well as tumor debulking and omentectomy. 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 The tumor measured 11.5 cm, focally involve the ovarian capsule on the left; the uterus and right ovary and fallopian tube were unremarkable. One para-aortic lymph node was positive out of a total of 6 lymph nodes sampled (2 left common iliac, 2 left para-aortic, and to within the omental resection).  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 Today is  day 1cycle 5 (dose #9) of abraxane given every 2 weeks, the plan being to continue to a total of 6 cycles (12 doses).. She continues to tolerate this well. She feels a little tired 1 or 2 days, but then pretty much goes back to her usual functional status. She denies nausea or vomiting problems, mouth sores, or change in bowel habits. Very rarely she Rhonda have a loose bowel movement. The "neuropathy" which is more likely an older nerve issue in her left hands fifth and fourth digits is entirely unchanged. She has no other neuropathic symptoms elsewhere.  [name] has lost his insurance. They are in the process of getting supplements for her. They seem to be terribly confused and indeed this is a very confusing transition on for anybody in terms of insurance plans.  She was supposed to have had her IVC filter removed but for some reason that has not yet been scheduled.  REVIEW OF SYSTEMS: A detailed review of systems today was otherwise stable.  PAST MEDICAL HISTORY: Past Medical History  Diagnosis Date  . Depression   . DVT (deep venous thrombosis)     hx of on HRT left leg ~2006  . GERD (gastroesophageal reflux disease)   . Hyperlipidemia   . Hypertension   . Hypothyroidism   . PPD positive, treated     rx inh   . Diabetes mellitus   . 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 DJD (degenerative joint disease) of lumbar spine   . Chronic combined systolic and diastolic CHF (congestive heart failure)     a. 08/2012 Echo: EF 55-60%, no rwma, mod MR;  b. 07/2014 Echo: EF 45-50%, distal antsept HK, mod TR/MR, mildly bil-atrial enlargement.  . Paroxysmal atrial fibrillation     a. CHA2DS2VASc = 4-->coumadin.  [name] Physical deconditioning   . Allergy   . Anemia     low iron hx  . Cataract     removed ou  . OSA on CPAP     cpap 4.5 setting  . Breast cancer 2014    a. 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 Clear Cell Ovarian Cancer     a. 06/2014 s/p TAH/BSO debulking/lymph node dissection;  b. Now on chemo.    PAST SURGICAL HISTORY: Past Surgical History  Procedure Laterality Date  . Tubal ligation    . Cholecystectomy    . Foot surgery    . Eye surgery    . Cataract extraction    . Percutaneous liver biopsy    . Breast biopsy      left breast  . Mastectomy modified radical  06/08/2012    Procedure: MASTECTOMY MODIFIED RADICAL;  Surgeon: Edward Jolly, MD;  Location: [place];  Service: General;  Laterality: Right;  . Portacath placement  06/08/2012    Procedure: INSERTION PORT-A-CATH;  Surgeon: Edward Jolly, MD;  Location: [place];  Service: General;  Laterality: Left;  . Peg tube placement      peg removed 2015  . History of chemotherapy x 2 treatments, radiation tx  2014  . Pac removed  2015  . Laparotomy N/A 06/27/2014    Procedure: EXPLORATORY LAPAROTOMY;  Surgeon: Everitt Amber, MD;  Location: [facility];  Service: Gynecology;  Laterality:  N/A;  . Abdominal hysterectomy N/A 06/27/2014    Procedure: HYSTERECTOMY ABDOMINAL TOTAL;  Surgeon: Everitt Amber, MD;  Location: [facility];  Service: Gynecology;  Laterality: N/A;  . Salpingoophorectomy Bilateral 06/27/2014    Procedure: BILATERAL SALPINGO OOPHORECTOMY/TUMOR DEBULKING/LYMPHNODE DISSECTION, OMENTECTOMY;  Surgeon: Everitt Amber, MD;  Location: [facility];  Service: Gynecology;  Laterality: Bilateral;    FAMILY HISTORY Family History  Problem Relation Age of Onset  . Diabetes Mother   . Hypertension Mother   . Arthritis Mother   . Heart disease Mother   . Heart failure Mother   . Other Mother     benign breast mass  . Stroke Father   . Heart disease Father   . Dementia Father   . Diabetes Paternal Grandmother   . Colon cancer Paternal Grandfather     dx. 70s-80s  . Other Daughter     potentially has had negative BRCA testing  . Other Daughter     hysterectomy; potentially has had  negative genetic testing   the patient's father died in his 62s with a history of dementia. He had had prior strokes. The patient's mother died in her 68s, with a history of congestive heart failure. Laniyah had no brothers, one sister. There is no history of breast or ovarian cancer in the family.  GYNECOLOGIC HISTORY: Menarche age 63, first live birth age 1, she is GX P2, menopause approximately 15 years ago, on hormone replacement until 2010.  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 Her husband [name] works for [employer]. Daughter Paul Half is a Physiological scientist and lives in [place]. Daughter Santiago Bur and her family (husband and 2 children aged 71 and 3 years) currently live with the patient. Syriah is a member of a [employer].   ADVANCED DIRECTIVES: Not in place  HEALTH MAINTENANCE: (Updated October 2014) Social History  Substance Use Topics  . Smoking status: Former Smoker -- 1.00 packs/day for 1 years  . Smokeless tobacco: Never Used  . Alcohol Use: No     Colonoscopy: Never  PAP: Does not recall  Bone density: Never  Lipid panel:   Allergies  Allergen Reactions  . Olmesartan Medoxomil Cough  . Adhesive [Tape] Rash  . Tetracycline Hcl Other (See Comments)  . Venlafaxine Other (See Comments)    severe dry mouth    Current Outpatient Prescriptions  Medication Sig Dispense Refill  . ALPRAZolam (XANAX XR) 1 MG 24 hr tablet Take 1 tablet (1 mg total) by mouth 2 (two) times daily. 180 tablet 0  . buPROPion (WELLBUTRIN XL) 150 MG 24 hr tablet Take 1 tablet (150 mg total) by mouth daily. 90 tablet 0  . clonazePAM (KLONOPIN) 0.5 MG tablet TAKE 1/2 TABLET BY MOUTH TWICE DAILY AS NEEDED 30 tablet 0  . docusate sodium (COLACE) 100 MG capsule Take 1 capsule (100 mg total) by mouth 2 (two) times daily. (Patient taking differently: Take 100 mg by mouth 2  (two) times daily as needed. ) 20 capsule 0  . enoxaparin (LOVENOX) 100 MG/ML injection Inject 1 mL (100 mg total) into the skin daily. 90 Syringe 2  . escitalopram (LEXAPRO) 20 MG tablet Take 20 mg by mouth daily.    . famotidine (PEPCID) 20 MG tablet Take 1 tablet (20 mg total) by mouth daily. 30 tablet 0  . furosemide (LASIX) 20 MG tablet Take 1 tablet (20 mg total) by  mouth daily. 90 tablet 1  . letrozole (FEMARA) 2.5 MG tablet Take 1 tablet (2.5 mg total) by mouth daily. 90 tablet 0  . levothyroxine (SYNTHROID, LEVOTHROID) 150 MCG tablet Take 1 tablet (150 mcg total) by mouth daily. 90 tablet 1  . lidocaine-prilocaine (EMLA) cream Apply over port area 1-2 hours before chemotherapy 30 g 0  . metoprolol (LOPRESSOR) 50 MG tablet Take 1 tablet (50 mg total) by mouth 2 (two) times daily. 180 tablet 1  . Nutritional Supplements (ENSURE HIGH PROTEIN PO) Take 237 mLs by mouth 2 (two) times daily.    [name] OLANZapine (ZYPREXA) 2.5 MG tablet Take 2.5 mg by mouth at bedtime.    . ondansetron (ZOFRAN) 8 MG tablet Take 1 tablet (8 mg total) by mouth 2 (two) times daily. Start the day after chemo for 2 days. Then take as needed for nausea or vomiting. 30 tablet 1  . potassium chloride (K-DUR) 10 MEQ tablet Take 1 tablet (10 mEq total) by mouth 2 (two) times daily. 180 tablet 1  . prochlorperazine (COMPAZINE) 10 MG tablet Take 1 tablet (10 mg total) by mouth every 6 (six) hours as needed (Nausea or vomiting). 30 tablet 1   No current facility-administered medications for this visit.    OBJECTIVE: Middle-aged white woman in no acute distress Filed Vitals:   02/05/15 0819  BP: 121/50  Pulse: 69  Temp: 98.3 F (36.8 C)  Resp: 18     Body mass index is 27.57 kg/(m^2).    ECOG FS: 1 Filed Weights   02/05/15 0819  Weight: 160 lb 11.2 oz (72.893 kg)   Sclerae unicteric, pupils round and equal Oropharynx clear and moist-- no thrush or other lesions No cervical or supraclavicular adenopathy Lungs no rales  or rhonchi Heart regular rate and rhythm, 2/6 systolic murmur as previously noted Abd soft, nontender, positive bowel sounds MSK no focal spinal tenderness, grade 1 bilateral lower extremity ankle edema with compression stockings in place Neuro: nonfocal, well oriented, appropriate affect Breasts: Deferred      Lab Results  Component Value Date   WBC 4.3 02/05/2015   NEUTROABS 3.3 02/05/2015   HGB 10.6* 02/05/2015   HCT 33.7* 02/05/2015   MCV 85.4 02/05/2015   PLT 118* 02/05/2015  Results for ANICKA, STUCKERT (MRN [id number]) as of 02/05/2015 09:03  Ref. Range 10/27/2014 09:24 11/16/2014 10:54 11/24/2014 07:49 12/25/2014 09:55 01/08/2015 09:05  CA 125 Latest Ref Range: <35 U/mL 123 (H) 86 (H) 93 (H) 88 (H) 103 (H)      Chemistry      Component Value Date/Time   NA 142 01/22/2015 0803   NA 137 12/15/2014 0947   K 4.2 01/22/2015 0803   K 3.8 12/15/2014 0947   CL 103 12/15/2014 0947   CL 104 10/29/2012 1058   CO2 25 01/22/2015 0803   CO2 28 12/15/2014 0947   BUN 7.6 01/22/2015 0803   BUN 10 12/15/2014 0947   CREATININE 0.6 01/22/2015 0803   CREATININE 0.40 12/15/2014 0947   CREATININE 0.69 11/10/2013 0828      Component Value Date/Time   CALCIUM 8.7 01/22/2015 0803   CALCIUM 8.5 12/15/2014 0947   ALKPHOS 182* 01/22/2015 0803   ALKPHOS 152* 08/18/2014 1320   AST 30 01/22/2015 0803   AST 40* 08/18/2014 1320   ALT 18 01/22/2015 0803   ALT 20 08/18/2014 1320   BILITOT 0.63 01/22/2015 0803   BILITOT 0.8 08/18/2014 1320     STUDIES: No results found.  ASSESSMENT:  59 y.o.  BRCA negative [name]   (1)  status post right mastectomy  06/08/2012 for a pT3, pN1a, stage IIIA invasive ductal carcinoma, grade 2,  estrogen and progesterone receptor positive, HER-2/neu negative, with an MIB-1 of 20%.    (2)  treated in the adjuvant setting with docetaxel/ cyclophosphamide given every 3 weeks. There were multiple and severe complications, and the  patient tolerated only two  cycles, last dose 07/29/2012  (3) letrozole started 08/16/2012  (a) osteopenia, with a T score of -1.14 April 2013  (4) postmastectomy radiation completed 12/24/2012  (5)  comorbidities include diabetes, hypertension, chronic liver disease with cirrhosis and fatty liver, and nutritional disturbance with temporarily dependence on PEG feeds (discontinued July 2014).  (6) restaging studies January 2016 show  (a) 8 mm and 4 mm Right middle lobe lung nodules, not metabolically active on PET -- Rhonda require follow-up  (b) hypermetabolic 24.4 cm mixed solid/cystic lesion in upper pelvis, with associated adenopathy  (7) status post exploratory laparotomy 06/27/2014 with total abdominal hysterectomy, bilateral salpingo-oophorectomy, para-aortic lymphadenectomy, omentectomy and radical tumor debulking for a clear cell ovarian cancer, pT1c pN1, stage IIIC .  (8) adjuvant chemotherapy consisting of carboplatin and paclitaxel given weekly days 1 and 8 of each 21 day cycle, for 6-8 cycles as tolerated, started 07/17/2014, with onpro support  (a) day 8 cycle 2 omitted and further treatments cancelled because of an episode of Klebsiella sepsis requiring intensive care unit admission  (9) bilateral lower extremity DVTs documented 09/25/2014 despite being on Coumadin for PAF (subtherapeutic INR) with mobile Right common femoral clot  (a) status post thrombolysis 09/30/2014  (b) on Lovenox daily as of 10/02/2014  (c) IVC filter placed 10/26/2014 as mobile clot still noted Right groin  (d) IVC filter to be removed 01/30/2015 if repeat dopplers before that allow it  (10) started abraxane 10/13/2014, to be repeated every 14 days for 6 cycles (12 doses)  (11) genetics testing December 04, 2014 through the Breast[facility]varian [facility] mutations in ATM, BARD1, BRCA1, BRCA2, BRIP1, CDH1, CHEK2, FANCC, MLH1, MSH2, MSH6, NBN, PALB2, PMS2, PTEN, RAD51C, RAD51D, STK11, TP53,  and XRCC2.    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   PLAN: Tarrah is tolerating the Abraxane well and clinically she gives no symptoms or signs suggestive of disease progression. The plan is to continue through 6 cycles and then restage. She Rhonda have a PET scan after the completion of her planned treatments.  They are switching insurance which is a very complicated matter and I think [name] is somewhat confused regarding what is the best way to proceed. I am asking our financial advisors to give him a hand in choosing the best Medicare supplement for them.  The order is in for IVC retrieval. Hopefully that Rhonda be done within the next 2 weeks.  His CEA 125 is bouncing up and down a little. I don't see a clear trend at this point. We Rhonda continue to follow.  We again discussed diet. She is mostly maintaining her weight. She seems to do better when they go out to eat and perhaps that should plan on that 2 or 3 times a week. She is only having diarrhea very occasionally so we did not have to change any medications.  She knows to call for any problems that may develop before her next visit here. Chauncey Cruel, MD    02/05/2015

## 2015-02-05 NOTE — Telephone Encounter (Signed)
Gave avs & calendar for September thru November. Left message with IR for Removal of filter.

## 2015-02-06 LAB — CA 125: CA 125: 77 U/mL — AB (ref <35)

## 2015-02-16 ENCOUNTER — Encounter: Payer: Self-pay | Admitting: *Deleted

## 2015-02-19 ENCOUNTER — Ambulatory Visit (HOSPITAL_BASED_OUTPATIENT_CLINIC_OR_DEPARTMENT_OTHER): Payer: BLUE CROSS/BLUE SHIELD | Admitting: Nurse Practitioner

## 2015-02-19 ENCOUNTER — Other Ambulatory Visit (HOSPITAL_BASED_OUTPATIENT_CLINIC_OR_DEPARTMENT_OTHER): Payer: BLUE CROSS/BLUE SHIELD

## 2015-02-19 ENCOUNTER — Telehealth: Payer: Self-pay | Admitting: Nurse Practitioner

## 2015-02-19 ENCOUNTER — Ambulatory Visit (HOSPITAL_BASED_OUTPATIENT_CLINIC_OR_DEPARTMENT_OTHER): Payer: BLUE CROSS/BLUE SHIELD

## 2015-02-19 ENCOUNTER — Encounter: Payer: Self-pay | Admitting: Nurse Practitioner

## 2015-02-19 VITALS — BP 117/56 | HR 70 | Temp 97.5°F | Resp 18 | Ht 64.0 in | Wt 163.3 lb

## 2015-02-19 DIAGNOSIS — G620 Drug-induced polyneuropathy: Secondary | ICD-10-CM

## 2015-02-19 DIAGNOSIS — C50911 Malignant neoplasm of unspecified site of right female breast: Secondary | ICD-10-CM

## 2015-02-19 DIAGNOSIS — Z86718 Personal history of other venous thrombosis and embolism: Secondary | ICD-10-CM

## 2015-02-19 DIAGNOSIS — Z5111 Encounter for antineoplastic chemotherapy: Secondary | ICD-10-CM | POA: Diagnosis not present

## 2015-02-19 DIAGNOSIS — Z5189 Encounter for other specified aftercare: Secondary | ICD-10-CM | POA: Diagnosis not present

## 2015-02-19 DIAGNOSIS — C569 Malignant neoplasm of unspecified ovary: Secondary | ICD-10-CM

## 2015-02-19 LAB — COMPREHENSIVE METABOLIC PANEL (CC13)
ALBUMIN: 3.2 g/dL — AB (ref 3.5–5.0)
ALK PHOS: 194 U/L — AB (ref 40–150)
ALT: 22 U/L (ref 0–55)
AST: 35 U/L — AB (ref 5–34)
Anion Gap: 7 mEq/L (ref 3–11)
BILIRUBIN TOTAL: 0.49 mg/dL (ref 0.20–1.20)
BUN: 7.2 mg/dL (ref 7.0–26.0)
CALCIUM: 8.8 mg/dL (ref 8.4–10.4)
CO2: 23 mEq/L (ref 22–29)
CREATININE: 0.7 mg/dL (ref 0.6–1.1)
Chloride: 112 mEq/L — ABNORMAL HIGH (ref 98–109)
EGFR: >90 mL/min/{1.73_m2} (ref >90)
GLUCOSE: 118 mg/dL (ref 70–140)
POTASSIUM: 4.2 meq/L (ref 3.5–5.1)
SODIUM: 143 meq/L (ref 136–145)
TOTAL PROTEIN: 6.1 g/dL — AB (ref 6.4–8.3)

## 2015-02-19 LAB — CBC WITH DIFFERENTIAL/PLATELET
BASO%: 0.3 % (ref 0.0–2.0)
BASOS ABS: 0 10*3/uL (ref 0.0–0.1)
EOS ABS: 0.1 10*3/uL (ref 0.0–0.5)
EOS%: 1.8 % (ref 0.0–7.0)
HEMATOCRIT: 35 % (ref 34.8–46.6)
HEMOGLOBIN: 10.7 g/dL — AB (ref 11.6–15.9)
LYMPH#: 0.6 10*3/uL — AB (ref 0.9–3.3)
LYMPH%: 14.9 % (ref 14.0–49.7)
MCH: 26.7 pg (ref 25.1–34.0)
MCHC: 30.6 g/dL — ABNORMAL LOW (ref 31.5–36.0)
MCV: 87.3 fL (ref 79.5–101.0)
MONO#: 0.3 10*3/uL (ref 0.1–0.9)
MONO%: 6.5 % (ref 0.0–14.0)
NEUT%: 76.5 % (ref 38.4–76.8)
NEUTROS ABS: 3 10*3/uL (ref 1.5–6.5)
Platelets: 127 10*3/uL — ABNORMAL LOW (ref 145–400)
RBC: 4.01 10*6/uL (ref 3.70–5.45)
RDW: 21.1 % — AB (ref 11.2–14.5)
WBC: 4 10*3/uL (ref 3.9–10.3)

## 2015-02-19 MED ORDER — SODIUM CHLORIDE 0.9 % IV SOLN
Freq: Once | INTRAVENOUS | Status: AC
  Administered 2015-02-19: 11:00:00 via INTRAVENOUS

## 2015-02-19 MED ORDER — HEPARIN SOD (PORK) LOCK FLUSH 100 UNIT/ML IV SOLN
500.0000 [IU] | Freq: Once | INTRAVENOUS | Status: AC | PRN
  Administered 2015-02-19: 500 [IU]
  Filled 2015-02-19: qty 5

## 2015-02-19 MED ORDER — SODIUM CHLORIDE 0.9 % IJ SOLN
10.0000 mL | INTRAMUSCULAR | Status: DC | PRN
  Administered 2015-02-19: 10 mL
  Filled 2015-02-19: qty 10

## 2015-02-19 MED ORDER — DEXAMETHASONE SODIUM PHOSPHATE 100 MG/10ML IJ SOLN
Freq: Once | INTRAMUSCULAR | Status: AC
  Administered 2015-02-19: 11:00:00 via INTRAVENOUS
  Filled 2015-02-19: qty 4

## 2015-02-19 MED ORDER — PACLITAXEL PROTEIN-BOUND CHEMO INJECTION 100 MG
100.0000 mg/m2 | Freq: Once | INTRAVENOUS | Status: AC
  Administered 2015-02-19: 175 mg via INTRAVENOUS
  Filled 2015-02-19: qty 35

## 2015-02-19 MED ORDER — PEGFILGRASTIM 6 MG/0.6ML ~~LOC~~ PSKT
6.0000 mg | PREFILLED_SYRINGE | Freq: Once | SUBCUTANEOUS | Status: AC
  Administered 2015-02-19: 6 mg via SUBCUTANEOUS
  Filled 2015-02-19: qty 0.6

## 2015-02-19 NOTE — Progress Notes (Signed)
ID: [name]   DOB: March 23, 1956  MR#: [id number]  QMV#:[id number]  PCP: [name], NP GYN:  SU: [facility] OTHER MW:UXLKGM [name], Shanon Ace, Roney Jaffe  CHIEF COMPLAINT:  Estrogen receptor positive Breast Cancer; Ovarian cancer; DVT  CURRENT TREATMENT: Letrozole; abraxane; lovenox   BREAST CANCER HISTORY: From the original consult note:  [name] noted a mass in her right breast mid December 2013, and as it did not spontaneously resolve over a couple of weeks she brought it to her primary physician's attention. She was set up for diagnostic mammography and right breast ultrasonography at the breast Center 05/10/2012. (Note that the patient's most recent prior mammography had been in October 2008). The current study showed a spiculated mass in the superior subareolar portion of the right breast measuring approximately 5 cm and associated with pleomorphic calcifications. This was firm and palpable. There was right nipple retraction and skin thickening. Ultrasound confirmed an irregularly marginated hypoechoic mass measuring 3.5 cm by ultrasound, and an abnormal appearing lower right axillary lymph node measuring 2.6 cm.  Biopsies of both the breast mass and the abnormal appearing lymph node were performed 05/21/2012. Both showed an invasive ductal carcinoma, grade 2, with similar prognostic panels (the breast mass was 100% estrogen and 73% progesterone receptor positive, with an MIB-1 of 5%; the lymph node was 100% estrogen 100% progesterone receptor positive, with an MIB-1 of 20%). Both masses were HER-2 negative.  Breast MRI obtained at [facility] 05/29/2012 confirmed a dominant mass in the retroareolar right breast measuring 4.4 cm maximally. There was a satellite nodule inferior and lateral to this mass, measuring 1.7 cm. There were no other masses in either breast. Aside from the previously biopsied lymph node there were other mildly  enhancing level I right axillary lymph nodes which did not appear pathologic. There was no other lymphadenopathy noted.   The patient's subsequent history is as detailed below.  OVARIAN CANCER HISTORY: From the 06/16/2014 summary note:  "[name] returns today for review of her restaging studies accompanied by her husband [name]. To summarize her recent history: She was admitted to [facility] with a diagnosis of possible pneumonia in late December 2015. Chest x-ray showed some increased density in the right upper lobe. CT scan was suggested and was obtained by Dr. Inda Castle. This showed 2 small nodules in the right middle lobe, measuring 8 and 4 mm respectively. Dr. Inda Castle then contacted [name] for further evaluation and we set [place] up for a PET scan and which was performed late January. This showed the 2 nodules in the lung not to be hypermetabolic. This of course does not prove that they are not malignant. More importantly, there was a 13.7 cm cystic/solid mass in the upper pelvis associated with adenopathy,. All of this was hypermetabolic."  The patient was evaluated by gynecologic oncology and on 06/27/2014 she underwent surgery under Dr. Denman George. This consisted of an exploratory laparotomy with hysterectomy and abdominal salpingo-oophorectomy, as well as tumor debulking and omentectomy. 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 The tumor measured 11.5 cm, focally involve the ovarian capsule on the left; the uterus and right ovary and fallopian tube were unremarkable. One para-aortic lymph node was positive out of a total of 6 lymph nodes sampled (2 left common iliac, 2 left para-aortic, and to within the omental resection).  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 Today is  day 15 cycle 5 (dose #10) of abraxane given every 2 weeks, the plan being to continue to a total of 6 cycles (12 doses).  REVIEW OF SYSTEMS: Dianne denies fevers, chills, nausea, vomiting, or changes in bowel or bladder habits. She is eating well and gaining weight. She is down to supplementing with just 1 Boost shake daily. She denies mouth sores and rashes. She has continued numbness to the pinky finger on her left finger, but the ring finger is no longer affected. She has excellent energy after the first 2 days past treatment. A detailed review of systems today was otherwise stable.  PAST MEDICAL HISTORY: Past Medical History  Diagnosis Date  . Depression   . DVT (deep venous thrombosis)     hx of on HRT left leg ~2006  . GERD (gastroesophageal reflux disease)   . Hyperlipidemia   . Hypertension   . Hypothyroidism   . PPD positive, treated     rx inh   . Diabetes mellitus   . 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 DJD (degenerative joint disease) of lumbar spine   . Chronic combined systolic and diastolic CHF (congestive heart failure)     a. 08/2012 Echo: EF 55-60%, no rwma, mod MR;  b. 07/2014 Echo: EF 45-50%, distal antsept HK, mod TR/MR, mildly bil-atrial enlargement.  . Paroxysmal atrial fibrillation     a. CHA2DS2VASc = 4-->coumadin.  [name] Physical deconditioning   . Allergy   . Anemia     low iron hx  . Cataract     removed ou  . OSA on CPAP     cpap 4.5 setting  . Breast cancer 2014    a. 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 Clear Cell Ovarian Cancer     a. 06/2014 s/p TAH/BSO debulking/lymph node dissection;  b. Now on chemo.    PAST SURGICAL HISTORY: Past Surgical History  Procedure Laterality Date  . Tubal ligation    . Cholecystectomy    . Foot surgery    . Eye surgery    . Cataract extraction    . Percutaneous liver biopsy    .  Breast biopsy      left breast  . Mastectomy modified radical  06/08/2012    Procedure: MASTECTOMY MODIFIED RADICAL;  Surgeon: Edward Jolly, MD;  Location: [place];  Service: General;  Laterality: Right;  . Portacath placement  06/08/2012    Procedure: INSERTION PORT-A-CATH;  Surgeon: Edward Jolly, MD;  Location: [place];  Service: General;  Laterality: Left;  . Peg tube placement      peg removed 2015  . History of chemotherapy x 2 treatments, radiation tx  2014  . Pac removed  2015  . Laparotomy N/A 06/27/2014    Procedure: EXPLORATORY LAPAROTOMY;  Surgeon: Everitt Amber, MD;  Location: [facility];  Service: Gynecology;  Laterality: N/A;  . Abdominal hysterectomy N/A 06/27/2014    Procedure: HYSTERECTOMY ABDOMINAL TOTAL;  Surgeon: Everitt Amber, MD;  Location: [facility];  Service: Gynecology;  Laterality: N/A;  . Salpingoophorectomy Bilateral 06/27/2014    Procedure: BILATERAL SALPINGO OOPHORECTOMY/TUMOR DEBULKING/LYMPHNODE DISSECTION, OMENTECTOMY;  Surgeon: Everitt Amber, MD;  Location: [facility];  Service: Gynecology;  Laterality: Bilateral;    FAMILY HISTORY Family History  Problem Relation Age of Onset  . Diabetes Mother   . Hypertension Mother   . Arthritis Mother   . Heart disease Mother   . Heart failure Mother   . Other Mother     benign breast mass  . Stroke Father   . Heart disease Father   . Dementia Father   . Diabetes Paternal Grandmother   . Colon cancer Paternal Grandfather     dx. 70s-80s  . Other Daughter     potentially has had negative BRCA testing  . Other Daughter     hysterectomy; potentially has had negative genetic testing   the patient's father died in his 31s with a history of dementia. He had had prior strokes. The patient's mother died in her 57s, with a history of congestive heart failure. [place] had no brothers, one sister. There is no history of breast or ovarian cancer in the family.  GYNECOLOGIC HISTORY: Menarche age 59, first live birth age 57, she is GX  P2, menopause approximately 15 years ago, on hormone replacement until 2010.  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 Her husband [name] works for [employer]. Daughter Paul Half is a Physiological scientist and lives in [place]. Daughter Santiago Bur and her family (husband and 2 children aged 36 and 3 years) currently live with the patient. Huntley is a member of a [employer].   ADVANCED DIRECTIVES: Not in place  HEALTH MAINTENANCE: (Updated October 2014) Social History  Substance Use Topics  . Smoking status: Former Smoker -- 1.00 packs/day for 1 years  . Smokeless tobacco: Never Used  . Alcohol Use: No     Colonoscopy: Never  PAP: Does not recall  Bone density: Never  Lipid panel:   Allergies  Allergen Reactions  . Olmesartan Medoxomil Cough  . Adhesive [Tape] Rash  . Tetracycline Hcl Other (See Comments)  . Venlafaxine Other (See Comments)    severe dry mouth    Current Outpatient Prescriptions  Medication Sig Dispense Refill  . acetaminophen (TYLENOL) 500 MG tablet Take 500 mg by mouth every 6 (six) hours as needed for moderate pain.    [name] ALPRAZolam (XANAX XR) 1 MG 24 hr tablet Take 1 tablet (1 mg total) by mouth 2 (two) times daily. 180 tablet 0  . buPROPion (WELLBUTRIN XL) 150 MG 24 hr tablet Take 1 tablet (150 mg total) by mouth daily. 90 tablet 0  . clonazePAM (KLONOPIN) 0.5 MG tablet TAKE 1/2 TABLET BY MOUTH TWICE DAILY AS NEEDED (Patient taking differently: TAKE 1/2 TABLET BY MOUTH TWICE DAILY AS NEEDED FOR ANXIETY) 30 tablet 0  . docusate sodium (COLACE) 100 MG capsule Take 1 capsule (100 mg total) by mouth 2 (two) times daily. (Patient not taking: Reported on 02/16/2015) 20 capsule 0  . enoxaparin (LOVENOX) 100 MG/ML injection Inject 1 mL (100 mg total) into the skin daily. (Patient taking differently: Inject 100 mg into the skin at bedtime. ) 90  Syringe 2  . escitalopram (LEXAPRO) 20 MG tablet Take 20 mg by mouth every morning.     . famotidine (PEPCID) 20 MG tablet Take 1 tablet (20 mg total) by mouth daily. (Patient taking differently: Take 20 mg by mouth daily as needed for heartburn or indigestion. ) 30 tablet 0  . furosemide (LASIX) 20 MG tablet Take 1 tablet (20 mg total) by mouth daily. 90 tablet 1  . letrozole [facility])  2.5 MG tablet Take 1 tablet (2.5 mg total) by mouth daily. 90 tablet 0  . levothyroxine (SYNTHROID, LEVOTHROID) 150 MCG tablet Take 1 tablet (150 mcg total) by mouth daily. 90 tablet 1  . lidocaine-prilocaine (EMLA) cream Apply over port area 1-2 hours before chemotherapy 30 g 0  . metoprolol (LOPRESSOR) 50 MG tablet Take 1 tablet (50 mg total) by mouth 2 (two) times daily. 180 tablet 1  . Nutritional Supplements (ENSURE HIGH PROTEIN PO) Take 237 mLs by mouth 2 (two) times daily.    [name] OLANZapine (ZYPREXA) 2.5 MG tablet Take 2.5 mg by mouth at bedtime.    . ondansetron (ZOFRAN) 8 MG tablet Take 1 tablet (8 mg total) by mouth 2 (two) times daily. Start the day after chemo for 2 days. Then take as needed for nausea or vomiting. 30 tablet 1  . potassium chloride (K-DUR) 10 MEQ tablet Take 1 tablet (10 mEq total) by mouth 2 (two) times daily. 180 tablet 1  . prochlorperazine (COMPAZINE) 10 MG tablet Take 1 tablet (10 mg total) by mouth every 6 (six) hours as needed (Nausea or vomiting). 30 tablet 1   No current facility-administered medications for this visit.    OBJECTIVE: Middle-aged white woman in no acute distress Filed Vitals:   02/19/15 0916  BP: 117/56  Pulse: 70  Temp: 97.5 F (36.4 C)  Resp: 18     Body mass index is 28.02 kg/(m^2).    ECOG FS: 1 Filed Weights   02/19/15 0916  Weight: 163 lb 4.8 oz (74.072 kg)   Skin: warm, dry  HEENT: sclerae anicteric, conjunctivae pink, oropharynx clear. No thrush or mucositis.  Lymph Nodes: No cervical or supraclavicular lymphadenopathy  Lungs: clear to  auscultation bilaterally, no rales, wheezes, or rhonci  Heart: regular rate and rhythm, soft systolic murmur noted Abdomen: round, soft, non tender, positive bowel sounds  Musculoskeletal: No focal spinal tenderness, no peripheral edema  Neuro: non focal, well oriented, positive affect  Breasts: deferred  Lab Results  Component Value Date   WBC 4.0 02/19/2015   NEUTROABS 3.0 02/19/2015   HGB 10.7* 02/19/2015   HCT 35.0 02/19/2015   MCV 87.3 02/19/2015   PLT 127* 02/19/2015  Results for RUCHEL, BRANDENBURGER (MRN [id number]) as of 02/19/2015 09:51  Ref. Range 11/16/2014 10:54 11/24/2014 07:49 12/25/2014 09:55 01/08/2015 09:05 02/05/2015 08:03  CA 125 Latest Ref Range: <35 U/mL 86 (H) 93 (H) 88 (H) 103 (H) 77 (H)       Chemistry      Component Value Date/Time   NA 142 02/05/2015 0803   NA 137 12/15/2014 0947   K 4.3 02/05/2015 0803   K 3.8 12/15/2014 0947   CL 103 12/15/2014 0947   CL 104 10/29/2012 1058   CO2 25 02/05/2015 0803   CO2 28 12/15/2014 0947   BUN 7.0 02/05/2015 0803   BUN 10 12/15/2014 0947   CREATININE 0.7 02/05/2015 0803   CREATININE 0.40 12/15/2014 0947   CREATININE 0.69 11/10/2013 0828      Component Value Date/Time   CALCIUM 8.6 02/05/2015 0803   CALCIUM 8.5 12/15/2014 0947   ALKPHOS 185* 02/05/2015 0803   ALKPHOS 152* 08/18/2014 1320   AST 29 02/05/2015 0803   AST 40* 08/18/2014 1320   ALT 16 02/05/2015 0803   ALT 20 08/18/2014 1320   BILITOT 0.52 02/05/2015 0803   BILITOT 0.8 08/18/2014 1320     STUDIES: No results found.  ASSESSMENT: 59 y.o.  BRCA negative [name]   (  1)  status post right mastectomy  06/08/2012 for a pT3, pN1a, stage IIIA invasive ductal carcinoma, grade 2,  estrogen and progesterone receptor positive, HER-2/neu negative, with an MIB-1 of 20%.    (2)  treated in the adjuvant setting with docetaxel/ cyclophosphamide given every 3 weeks. There were multiple and severe complications, and the  patient tolerated only two cycles,  last dose 07/29/2012  (3) letrozole started 08/16/2012  (a) osteopenia, with a T score of -1.14 April 2013  (4) postmastectomy radiation completed 12/24/2012  (5)  comorbidities include diabetes, hypertension, chronic liver disease with cirrhosis and fatty liver, and nutritional disturbance with temporarily dependence on PEG feeds (discontinued July 2014).  (6) restaging studies January 2016 show  (a) 8 mm and 4 mm Right middle lobe lung nodules, not metabolically active on PET -- Rhonda require follow-up  (b) hypermetabolic 25.9 cm mixed solid/cystic lesion in upper pelvis, with associated adenopathy  (7) status post exploratory laparotomy 06/27/2014 with total abdominal hysterectomy, bilateral salpingo-oophorectomy, para-aortic lymphadenectomy, omentectomy and radical tumor debulking for a clear cell ovarian cancer, pT1c pN1, stage IIIC .  (8) adjuvant chemotherapy consisting of carboplatin and paclitaxel given weekly days 1 and 8 of each 21 day cycle, for 6-8 cycles as tolerated, started 07/17/2014, with onpro support  (a) day 8 cycle 2 omitted and further treatments cancelled because of an episode of Klebsiella sepsis requiring intensive care unit admission  (9) bilateral lower extremity DVTs documented 09/25/2014 despite being on Coumadin for PAF (subtherapeutic INR) with mobile Right common femoral clot  (a) status post thrombolysis 09/30/2014  (b) on Lovenox daily as of 10/02/2014  (c) IVC filter placed 10/26/2014 as mobile clot still noted Right groin  (d) IVC filter to be removed 01/30/2015 if repeat dopplers before that allow it  (10) started abraxane 10/13/2014, to be repeated every 14 days for 6 cycles (12 doses)  (11) genetics testing December 04, 2014 through the Breast[facility]Ovarian Cancer Panel [facility] no deleterious mutations in ATM, BARD1, BRCA1, BRCA2, BRIP1, CDH1, CHEK2, FANCC, MLH1, MSH2, MSH6, NBN, PALB2, PMS2, PTEN, RAD51C, RAD51D, STK11, TP53, and  XRCC2.    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   PLAN: Shakthi is in great spirits today. The labs were reviewed in detail and were entirely stable. She Rhonda proceed with dose #8 (day 15, cycle 5) of abraxane as planned today. After the completion of 6 cycles she Rhonda have a repeat PET scan.  We are moving treatment from 11/7 to 11/14 to allow for vacation time in [place].  She is having her IVC filter removed this week. She was advised to hold her lovenox the day before and the day of this procedure.   Shakeera Rhonda return in 2 weeks for dose #9 of abraxane. She understands and agrees with this plan. She has been encouraged to call with any issues that might arise before her next visit here.      Laurie Panda, NP    02/19/2015

## 2015-02-19 NOTE — Patient Instructions (Signed)
[facility] Discharge Instructions for Patients Receiving Chemotherapy  Today you received the following chemotherapy agents Abraxane  To help prevent nausea and vomiting after your treatment, we encourage you to take your nausea medication As directed  If you develop nausea and vomiting that is not controlled by your nausea medication, call the clinic.   BELOW ARE SYMPTOMS THAT SHOULD BE REPORTED IMMEDIATELY:  *FEVER GREATER THAN 100.5 F  *CHILLS WITH OR WITHOUT FEVER  NAUSEA AND VOMITING THAT IS NOT CONTROLLED WITH YOUR NAUSEA MEDICATION  *UNUSUAL SHORTNESS OF BREATH  *UNUSUAL BRUISING OR BLEEDING  TENDERNESS IN MOUTH AND THROAT WITH OR WITHOUT PRESENCE OF ULCERS  *URINARY PROBLEMS  *BOWEL PROBLEMS  UNUSUAL RASH Items with * indicate a potential emergency and should be followed up as soon as possible.  Feel free to call the clinic you have any questions or concerns. The clinic phone number is [phone].  Please show the CHEMO ALERT CARD at check-in to the Emergency Department and triage nurse.   

## 2015-02-19 NOTE — Telephone Encounter (Signed)
Appointments made and avs printed for patient °

## 2015-02-20 ENCOUNTER — Other Ambulatory Visit: Payer: Self-pay | Admitting: Radiology

## 2015-02-20 ENCOUNTER — Other Ambulatory Visit: Payer: Self-pay

## 2015-02-21 ENCOUNTER — Encounter (HOSPITAL_COMMUNITY): Payer: Self-pay

## 2015-02-21 ENCOUNTER — Ambulatory Visit (HOSPITAL_COMMUNITY)
Admission: RE | Admit: 2015-02-21 | Discharge: 2015-02-21 | Disposition: A | Discharge status: Home / Self Care | Payer: BLUE CROSS/BLUE SHIELD | Source: Ambulatory Visit | Attending: Diagnostic Radiology | Admitting: Diagnostic Radiology

## 2015-02-21 ENCOUNTER — Ambulatory Visit (HOSPITAL_COMMUNITY)
Admission: RE | Admit: 2015-02-21 | Discharge: 2015-02-21 | Disposition: A | Discharge status: Home / Self Care | Payer: BLUE CROSS/BLUE SHIELD | Source: Ambulatory Visit | Attending: Oncology | Admitting: Oncology

## 2015-02-21 DIAGNOSIS — Z79899 Other long term (current) drug therapy: Secondary | ICD-10-CM | POA: Insufficient documentation

## 2015-02-21 DIAGNOSIS — Z4689 Encounter for fitting and adjustment of other specified devices: Secondary | ICD-10-CM | POA: Insufficient documentation

## 2015-02-21 DIAGNOSIS — Z7901 Long term (current) use of anticoagulants: Secondary | ICD-10-CM | POA: Insufficient documentation

## 2015-02-21 DIAGNOSIS — Z853 Personal history of malignant neoplasm of breast: Secondary | ICD-10-CM | POA: Insufficient documentation

## 2015-02-21 DIAGNOSIS — I82409 Acute embolism and thrombosis of unspecified deep veins of unspecified lower extremity: Secondary | ICD-10-CM

## 2015-02-21 DIAGNOSIS — Z95828 Presence of other vascular implants and grafts: Secondary | ICD-10-CM | POA: Insufficient documentation

## 2015-02-21 DIAGNOSIS — Z86718 Personal history of other venous thrombosis and embolism: Secondary | ICD-10-CM | POA: Insufficient documentation

## 2015-02-21 DIAGNOSIS — Z8543 Personal history of malignant neoplasm of ovary: Secondary | ICD-10-CM | POA: Insufficient documentation

## 2015-02-21 LAB — CBC WITH DIFFERENTIAL/PLATELET
BASOS PCT: 0 %
Basophils Absolute: 0 10*3/uL (ref 0.0–0.1)
Eosinophils Absolute: 0 10*3/uL (ref 0.0–0.7)
Eosinophils Relative: 0 %
HEMATOCRIT: 35.4 % — AB (ref 36.0–46.0)
Hemoglobin: 10.9 g/dL — ABNORMAL LOW (ref 12.0–15.0)
LYMPHS ABS: 0.9 10*3/uL (ref 0.7–4.0)
Lymphocytes Relative: 2 %
MCH: 26.9 pg (ref 26.0–34.0)
MCHC: 30.8 g/dL (ref 30.0–36.0)
MCV: 87.4 fL (ref 78.0–100.0)
MONOS PCT: 0 %
Monocytes Absolute: 0 10*3/uL — ABNORMAL LOW (ref 0.1–1.0)
NEUTROS ABS: 44 10*3/uL — AB (ref 1.7–7.7)
Neutrophils Relative %: 98 %
Platelets: 121 10*3/uL — ABNORMAL LOW (ref 150–400)
RBC: 4.05 MIL/uL (ref 3.87–5.11)
RDW: 20.7 % — AB (ref 11.5–15.5)
WBC: 44.9 10*3/uL — AB (ref 4.0–10.5)

## 2015-02-21 LAB — BASIC METABOLIC PANEL
Anion gap: 6 (ref 5–15)
BUN: 10 mg/dL (ref 6–20)
CHLORIDE: 112 mmol/L — AB (ref 101–111)
CO2: 19 mmol/L — AB (ref 22–32)
CREATININE: 0.45 mg/dL (ref 0.44–1.00)
Calcium: 8.2 mg/dL — ABNORMAL LOW (ref 8.9–10.3)
GFR calc Af Amer: >60 mL/min (ref >60)
GFR calc non Af Amer: >60 mL/min (ref >60)
Glucose, Bld: 99 mg/dL (ref 65–99)
POTASSIUM: 4 mmol/L (ref 3.5–5.1)
Sodium: 137 mmol/L (ref 135–145)

## 2015-02-21 LAB — APTT: aPTT: 44 seconds — ABNORMAL HIGH (ref 24–37)

## 2015-02-21 LAB — PROTIME-INR
INR: 1.29 (ref 0.00–1.49)
Prothrombin Time: 16.2 seconds — ABNORMAL HIGH (ref 11.6–15.2)

## 2015-02-21 MED ORDER — IOHEXOL 300 MG/ML  SOLN
150.0000 mL | Freq: Once | INTRAMUSCULAR | Status: AC | PRN
  Administered 2015-02-21: 80 mL

## 2015-02-21 MED ORDER — FENTANYL CITRATE (PF) 100 MCG/2ML IJ SOLN
INTRAMUSCULAR | Status: AC
  Filled 2015-02-21: qty 2

## 2015-02-21 MED ORDER — MIDAZOLAM HCL 2 MG/2ML IJ SOLN
INTRAMUSCULAR | Status: AC
  Filled 2015-02-21: qty 4

## 2015-02-21 MED ORDER — HYDROCODONE-ACETAMINOPHEN 5-325 MG PO TABS: 1.0000 | ORAL_TABLET | ORAL | Status: DC | PRN

## 2015-02-21 MED ORDER — SODIUM CHLORIDE 0.9 % IV SOLN
INTRAVENOUS | Status: DC
  Administered 2015-02-21: 500 mL via INTRAVENOUS

## 2015-02-21 MED ORDER — MIDAZOLAM HCL 2 MG/2ML IJ SOLN
INTRAMUSCULAR | Status: AC | PRN
  Administered 2015-02-21: 1 mg via INTRAVENOUS

## 2015-02-21 MED ORDER — HEPARIN SOD (PORK) LOCK FLUSH 100 UNIT/ML IV SOLN
500.0000 [IU] | INTRAVENOUS | Status: AC | PRN
  Administered 2015-02-21: 500 [IU]
  Filled 2015-02-21: qty 5

## 2015-02-21 MED ORDER — LIDOCAINE HCL 1 % IJ SOLN
INTRAMUSCULAR | Status: AC
  Filled 2015-02-21: qty 20

## 2015-02-21 MED ORDER — LOPERAMIDE HCL 2 MG PO CAPS
2.0000 mg | ORAL_CAPSULE | ORAL | Status: AC | PRN
  Administered 2015-02-21: 2 mg via ORAL
  Filled 2015-02-21 (×2): qty 1

## 2015-02-21 MED ORDER — SODIUM CHLORIDE 0.9 % IJ SOLN
10.0000 mL | INTRAMUSCULAR | Status: AC | PRN
  Administered 2015-02-21: 10 mL

## 2015-02-21 MED ORDER — FENTANYL CITRATE (PF) 100 MCG/2ML IJ SOLN
INTRAMUSCULAR | Status: AC | PRN
  Administered 2015-02-21: 25 ug via INTRAVENOUS

## 2015-02-21 NOTE — Discharge Instructions (Signed)
Remove dressing 48 hours and may shower. No need to replace dressing    Moderate Conscious Sedation, Adult Sedation is the use of medicines to promote relaxation and relieve discomfort and anxiety. Moderate conscious sedation is a type of sedation. Under moderate conscious sedation you are less alert than normal but are still able to respond to instructions or stimulation. Moderate conscious sedation is used during short medical and dental procedures. It is milder than deep sedation or general anesthesia and allows you to return to your regular activities sooner. LET [facility] CARE PROVIDER KNOW ABOUT:   Any allergies you have.  All medicines you are taking, including vitamins, herbs, eye drops, creams, and over-the-counter medicines.  Use of steroids (by mouth or creams).  Previous problems you or members of your family have had with the use of anesthetics.  Any blood disorders you have.  Previous surgeries you have had.  Medical conditions you have.  Possibility of pregnancy, if this applies.  Use of cigarettes, alcohol, or illegal drugs. RISKS AND COMPLICATIONS Generally, this is a safe procedure. However, as with any procedure, problems can occur. Possible problems include:  Oversedation.  Trouble breathing on your own. You may need to have a breathing tube until you are awake and breathing on your own.  Allergic reaction to any of the medicines used for the procedure. BEFORE THE PROCEDURE  You may have blood tests done. These tests can help show how well your kidneys and liver are working. They can also show how well your blood clots.  A physical exam will be done.  Only take medicines as directed by your health care provider. You may need to stop taking medicines (such as blood thinners, aspirin, or nonsteroidal anti-inflammatory drugs) before the procedure.   Do not eat or drink at least 6 hours before the procedure or as directed by your health care  provider.  Arrange for a responsible adult, family member, or friend to take you home after the procedure. He or she should stay with you for at least 24 hours after the procedure, until the medicine has worn off. PROCEDURE   An intravenous (IV) catheter will be inserted into one of your veins. Medicine will be able to flow directly into your body through this catheter. You may be given medicine through this tube to help prevent pain and help you relax.  The medical or dental procedure will be done. AFTER THE PROCEDURE  You will stay in a recovery area until the medicine has worn off. Your blood pressure and pulse will be checked.   Depending on the procedure you had, you may be allowed to go home when you can tolerate liquids and your pain is under control.   This information is not intended to replace advice given to you by your health care provider. Make sure you discuss any questions you have with your health care provider.   Document Released: 01/21/2001 Document Revised: 05/19/2014 Document Reviewed: 01/03/2013 Elsevier Interactive Patient Education [employer].

## 2015-02-21 NOTE — Progress Notes (Signed)
Patient ID: [name], female   DOB: 1955-09-29, 59 y.o.   MRN: [id number]    Referring Physician(s): Magrinat,G  Chief Complaint: "I'm here to have my IVC filter removed"   Subjective: Patient familiar to IR service from prior placement of a retrievable IVC filter on 10/26/14. She was seen recently in follow-up by Dr.Henn in the IR clinic regarding possible IVC filter removal. 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 Recent follow-up venous duplex study on 01/16/15 suggests resolution of the mobile thrombus. Patient is currently on Lovenox therapy. She was deemed an appropriate candidate for IVC filter removal at this time and presents today for the procedure. She currently denies fevers, chills, headaches, chest pain, dyspnea, abdominal/back pain, nausea ,vomiting or abnormal bleeding.   Allergies: Olmesartan medoxomil; Adhesive; Tetracycline hcl; and Venlafaxine  Medications: Prior to Admission medications   Medication Sig Start Date End Date Taking? Authorizing Provider  ALPRAZolam (XANAX XR) 1 MG 24 hr tablet Take 1 tablet (1 mg total) by mouth 2 (two) times daily. 01/23/15  Yes Chauncey Cruel, MD  buPROPion (WELLBUTRIN XL) 150 MG 24 hr tablet Take 1 tablet (150 mg total) by mouth daily. 11/06/14  Yes Debbrah Alar, NP  clonazePAM (KLONOPIN) 0.5 MG tablet TAKE 1/2 TABLET BY MOUTH TWICE DAILY AS NEEDED Patient taking differently: TAKE 1/2 TABLET BY MOUTH TWICE DAILY AS NEEDED FOR ANXIETY 01/23/15  Yes Debbrah Alar, NP  escitalopram (LEXAPRO) 20 MG tablet Take 20 mg by mouth every morning.    Yes Historical Provider, MD  furosemide (LASIX) 20 MG tablet Take 1 tablet (20 mg total) by mouth daily. 12/15/14  Yes Debbrah Alar, NP  letrozole (FEMARA) 2.5 MG tablet Take 1  tablet (2.5 mg total) by mouth daily. 05/22/14  Yes Chauncey Cruel, MD  levothyroxine (SYNTHROID, LEVOTHROID) 150 MCG tablet Take 1 tablet (150 mcg total) by mouth daily. 12/15/14  Yes Debbrah Alar, NP  metoprolol (LOPRESSOR) 50 MG tablet Take 1 tablet (50 mg total) by mouth 2 (two) times daily. 08/25/14  Yes Debbrah Alar, NP  Nutritional Supplements (ENSURE HIGH PROTEIN PO) Take 237 mLs by mouth 2 (two) times daily.   Yes Historical Provider, MD  OLANZapine (ZYPREXA) 2.5 MG tablet Take 2.5 mg by mouth at bedtime.   Yes Historical Provider, MD  ondansetron (ZOFRAN) 8 MG tablet Take 1 tablet (8 mg total) by mouth 2 (two) times daily. Start the day after chemo for 2 days. Then take as needed for nausea or vomiting. 10/13/14  Yes Laurie Panda, NP  potassium chloride (K-DUR) 10 MEQ tablet Take 1 tablet (10 mEq total) by mouth 2 (two) times daily. 12/26/14  Yes Debbrah Alar, NP  acetaminophen (TYLENOL) 500 MG tablet Take 500 mg by mouth every 6 (six) hours as needed for moderate pain.    Historical Provider, MD  docusate sodium (COLACE) 100 MG capsule Take 1 capsule (100 mg total) by mouth 2 (two) times daily. Patient not taking: Reported on 02/16/2015 10/02/14   Barton Dubois, MD  enoxaparin (LOVENOX) 100 MG/ML injection Inject 1 mL (100 mg total) into the skin daily. Patient taking differently: Inject 100 mg into the skin at bedtime.  02/05/15   Chauncey Cruel, MD  famotidine (PEPCID) 20 MG tablet Take 1 tablet (20 mg total) by mouth daily. Patient taking differently:  Take 20 mg by mouth daily as needed for heartburn or indigestion.  08/08/14   Modena Jansky, MD  lidocaine-prilocaine (EMLA) cream Apply over port area 1-2 hours before chemotherapy 07/14/14   Chauncey Cruel, MD  prochlorperazine (COMPAZINE) 10 MG tablet Take 1 tablet (10 mg total) by mouth every 6 (six) hours as needed (Nausea or vomiting). 09/25/14   Chauncey Cruel, MD     Vital Signs: Ht 5\' 5"  (1.651 m)  Wt  163 lb (73.936 kg)  BMI 27.12 kg/m2  Physical Exam  Constitutional: She is oriented to person, place, and time. She appears well-developed and well-nourished.  Cardiovascular: Normal rate and regular rhythm.   Clean intact left chest wall Port-A-Cath  Pulmonary/Chest: Effort normal and breath sounds normal.  Abdominal: Soft. Bowel sounds are normal.  Musculoskeletal: Normal range of motion. She exhibits no edema.  Neurological: She is alert and oriented to person, place, and time.    Imaging: No results found.  Labs:  CBC:  Recent Labs  01/08/15 0905 01/22/15 0803 02/05/15 0803 02/19/15 0849  WBC 5.3 4.6 4.3 4.0  HGB 10.6* 10.3* 10.6* 10.7*  HCT 33.3* 32.8* 33.7* 35.0  PLT 143* 100* 118* 127*    COAGS:  Recent Labs  07/30/14 0430 07/31/14 0500 08/01/14 0434  08/03/14 0450  10/02/14 0415 10/06/14 0753 10/13/14 0829 11/03/14 1022  INR 2.44* 1.90* 1.63*  < > 1.84*  < > 1.46 1.20* 1.20* 1.20*  APTT 35 31 35  --  31  --   --   --   --   --   < > = values in this interval not displayed.  BMP:  Recent Labs  08/18/14 1320  09/26/14 0358 09/30/14 0408 10/02/14 0415  12/15/14 0947  01/08/15 0905 01/22/15 0803 02/05/15 0803 02/19/15 0849  NA 136  < > 137 138 135  < > 137  < > 143 142 142 143  K 4.0  < > 3.0* 3.9 3.8  < > 3.8  < > 4.2 4.2 4.3 4.2  CL 103  --  108 109 106  --  103  --   --   --   --   --   CO2 24  < > 23 20* 20*  < > 28  < > 26 25 25 23   GLUCOSE 72  < > 89 121* 101*  < > 73  < > 112 122 139 118  BUN 5*  < > 8 6 6   < > 10  < > 8.7 7.6 7.0 7.2  CALCIUM 7.9*  < > 7.8* 8.5* 8.4*  < > 8.5  < > 9.0 8.7 8.6 8.8  CREATININE 0.68  < > 0.35* 0.42* 0.45  < > 0.40  < > 0.6 0.6 0.7 0.7  GFRNONAA >90  --  >60 >60 >60  --   --   --   --   --   --   --   GFRAA >90  --  >60 >60 >60  --   --   --   --   --   --   --   < > = values in this interval not displayed.  LIVER FUNCTION TESTS:  Recent Labs  01/08/15 0905 01/22/15 0803 02/05/15 0803  02/19/15 0849  BILITOT 0.74 0.63 0.52 0.49  AST 32 30 29 35*  ALT 20 18 16 22   ALKPHOS 187* 182* 185* 194*  PROT 6.2* 5.9* 5.9* 6.1*  ALBUMIN 3.1* 3.0*  3.1* 3.2*    Assessment and Plan: Patient with history of breast/ ovarian cancer and bilateral lower extremity DVT with right common femoral vein mobile thrombus diagnosed in May 2016. Status post IVC filter placement on 10/26/14. Recent follow-up venous duplex study on 01/16/15 suggest resolution of the mobile thrombus. Patient currently on Lovenox therapy. Patient seen recently in IR clinic and deemed an appropriate candidate for IVC filter removal at this time.Risks and benefits discussed with the patient/husband including, but not limited to bleeding, infection, contrast induced renal failure, filter fracture or inability to remove filter. All of the patient's questions were answered, patient is agreeable to proceed.   Signed: D. Rowe Robert 02/21/2015, 10:05 AM   I spent a total of 15 minutes at the the patient's bedside AND on the patient's hospital floor or unit, greater than 50% of which was counseling/coordinating care for IVC filter removal

## 2015-02-21 NOTE — Procedures (Signed)
Successful retrieval of IVC filter.  IVC is patent.  No immediate complications.  See full report in PACS.

## 2015-02-22 ENCOUNTER — Other Ambulatory Visit: Payer: Self-pay | Admitting: Family

## 2015-02-22 NOTE — Telephone Encounter (Signed)
OK to send 30 tabs.

## 2015-02-22 NOTE — Telephone Encounter (Signed)
Last filled:  01/23/15 Amt: 30, 0 Last OV: 12/15/14 Contract on file. UDS: MODERATE risk  Please advise.

## 2015-02-23 NOTE — Telephone Encounter (Signed)
Rx called to [facility] at [place] as below.

## 2015-03-05 ENCOUNTER — Ambulatory Visit (HOSPITAL_BASED_OUTPATIENT_CLINIC_OR_DEPARTMENT_OTHER): Payer: BLUE CROSS/BLUE SHIELD | Admitting: Oncology

## 2015-03-05 ENCOUNTER — Other Ambulatory Visit: Payer: BLUE CROSS/BLUE SHIELD

## 2015-03-05 ENCOUNTER — Telehealth: Payer: Self-pay | Admitting: Family

## 2015-03-05 ENCOUNTER — Ambulatory Visit (HOSPITAL_BASED_OUTPATIENT_CLINIC_OR_DEPARTMENT_OTHER): Payer: BLUE CROSS/BLUE SHIELD

## 2015-03-05 ENCOUNTER — Encounter: Payer: Self-pay | Admitting: Nutrition

## 2015-03-05 VITALS — BP 127/50 | HR 88 | Temp 97.8°F | Resp 18 | Ht 65.0 in | Wt 171.2 lb

## 2015-03-05 DIAGNOSIS — Z5189 Encounter for other specified aftercare: Secondary | ICD-10-CM | POA: Diagnosis not present

## 2015-03-05 DIAGNOSIS — G620 Drug-induced polyneuropathy: Secondary | ICD-10-CM

## 2015-03-05 DIAGNOSIS — C50911 Malignant neoplasm of unspecified site of right female breast: Secondary | ICD-10-CM

## 2015-03-05 DIAGNOSIS — C569 Malignant neoplasm of unspecified ovary: Secondary | ICD-10-CM | POA: Diagnosis not present

## 2015-03-05 DIAGNOSIS — Z5111 Encounter for antineoplastic chemotherapy: Secondary | ICD-10-CM | POA: Diagnosis not present

## 2015-03-05 DIAGNOSIS — Z86718 Personal history of other venous thrombosis and embolism: Secondary | ICD-10-CM

## 2015-03-05 DIAGNOSIS — K760 Fatty (change of) liver, not elsewhere classified: Secondary | ICD-10-CM

## 2015-03-05 DIAGNOSIS — I4891 Unspecified atrial fibrillation: Secondary | ICD-10-CM

## 2015-03-05 LAB — CBC WITH DIFFERENTIAL/PLATELET
BASO%: 0.3 % (ref 0.0–2.0)
BASOS ABS: 0 10*3/uL (ref 0.0–0.1)
EOS ABS: 0.1 10*3/uL (ref 0.0–0.5)
EOS%: 1.9 % (ref 0.0–7.0)
HCT: 33.3 % — ABNORMAL LOW (ref 34.8–46.6)
HGB: 10.6 g/dL — ABNORMAL LOW (ref 11.6–15.9)
LYMPH%: 15.9 % (ref 14.0–49.7)
MCH: 26.7 pg (ref 25.1–34.0)
MCHC: 31.7 g/dL (ref 31.5–36.0)
MCV: 83.9 fL (ref 79.5–101.0)
MONO#: 0.3 10*3/uL (ref 0.1–0.9)
MONO%: 8.8 % (ref 0.0–14.0)
NEUT%: 73.1 % (ref 38.4–76.8)
NEUTROS ABS: 2.7 10*3/uL (ref 1.5–6.5)
Platelets: 132 10*3/uL — ABNORMAL LOW (ref 145–400)
RBC: 3.97 10*6/uL (ref 3.70–5.45)
RDW: 23.7 % — AB (ref 11.2–14.5)
WBC: 3.7 10*3/uL — AB (ref 3.9–10.3)
lymph#: 0.6 10*3/uL — ABNORMAL LOW (ref 0.9–3.3)

## 2015-03-05 LAB — COMPREHENSIVE METABOLIC PANEL (CC13)
ALT: 18 U/L (ref 0–55)
AST: 32 U/L (ref 5–34)
Albumin: 3.1 g/dL — ABNORMAL LOW (ref 3.5–5.0)
Alkaline Phosphatase: 177 U/L — ABNORMAL HIGH (ref 40–150)
Anion Gap: 8 mEq/L (ref 3–11)
BUN: 5.1 mg/dL — AB (ref 7.0–26.0)
CO2: 22 meq/L (ref 22–29)
Calcium: 8.8 mg/dL (ref 8.4–10.4)
Chloride: 113 mEq/L — ABNORMAL HIGH (ref 98–109)
Creatinine: 0.7 mg/dL (ref 0.6–1.1)
GLUCOSE: 184 mg/dL — AB (ref 70–140)
POTASSIUM: 3.9 meq/L (ref 3.5–5.1)
SODIUM: 143 meq/L (ref 136–145)
Total Bilirubin: 0.43 mg/dL (ref 0.20–1.20)
Total Protein: 6 g/dL — ABNORMAL LOW (ref 6.4–8.3)

## 2015-03-05 MED ORDER — HEPARIN SOD (PORK) LOCK FLUSH 100 UNIT/ML IV SOLN
500.0000 [IU] | Freq: Once | INTRAVENOUS | Status: AC | PRN
  Administered 2015-03-05: 500 [IU]
  Filled 2015-03-05: qty 5

## 2015-03-05 MED ORDER — PEGFILGRASTIM 6 MG/0.6ML ~~LOC~~ PSKT
6.0000 mg | PREFILLED_SYRINGE | Freq: Once | SUBCUTANEOUS | Status: AC
  Administered 2015-03-05: 6 mg via SUBCUTANEOUS
  Filled 2015-03-05: qty 0.6

## 2015-03-05 MED ORDER — SODIUM CHLORIDE 0.9 % IV SOLN
Freq: Once | INTRAVENOUS | Status: AC
  Administered 2015-03-05: 10:00:00 via INTRAVENOUS

## 2015-03-05 MED ORDER — METOPROLOL TARTRATE 50 MG PO TABS: 50.0000 mg | ORAL_TABLET | Freq: Two times a day (BID) | ORAL | Status: DC

## 2015-03-05 MED ORDER — PACLITAXEL PROTEIN-BOUND CHEMO INJECTION 100 MG
100.0000 mg/m2 | Freq: Once | INTRAVENOUS | Status: AC
  Administered 2015-03-05: 175 mg via INTRAVENOUS
  Filled 2015-03-05: qty 35

## 2015-03-05 MED ORDER — ESCITALOPRAM OXALATE 20 MG PO TABS: 20.0000 mg | ORAL_TABLET | Freq: Every morning | ORAL | Status: DC

## 2015-03-05 MED ORDER — POTASSIUM CHLORIDE ER 10 MEQ PO TBCR: 10.0000 meq | EXTENDED_RELEASE_TABLET | Freq: Two times a day (BID) | ORAL | Status: DC

## 2015-03-05 MED ORDER — OLANZAPINE 2.5 MG PO TABS: 2.5000 mg | ORAL_TABLET | Freq: Every day | ORAL | Status: DC

## 2015-03-05 MED ORDER — SODIUM CHLORIDE 0.9 % IJ SOLN
10.0000 mL | INTRAMUSCULAR | Status: DC | PRN
  Administered 2015-03-05: 10 mL
  Filled 2015-03-05: qty 10

## 2015-03-05 MED ORDER — LETROZOLE 2.5 MG PO TABS: 2.5000 mg | ORAL_TABLET | Freq: Every day | ORAL | Status: DC

## 2015-03-05 MED ORDER — LEVOTHYROXINE SODIUM 150 MCG PO TABS: 150.0000 ug | ORAL_TABLET | Freq: Every day | ORAL | Status: DC

## 2015-03-05 MED ORDER — BUPROPION HCL ER (XL) 150 MG PO TB24: 150.0000 mg | ORAL_TABLET | Freq: Every day | ORAL | Status: DC

## 2015-03-05 MED ORDER — SODIUM CHLORIDE 0.9 % IV SOLN
Freq: Once | INTRAVENOUS | Status: AC
  Administered 2015-03-05: 10:00:00 via INTRAVENOUS
  Filled 2015-03-05: qty 4

## 2015-03-05 NOTE — Telephone Encounter (Signed)
Caller name:Cope,[name] Relation to EW:[id number]  Call back number: [phone]   Reason for call:  Spouse states he would like to speak with Gilmore Laroche regarding spouse medication and would like all medication to go to  [facility], [place] - [place] [phone] (Phone) [phone] (Fax)

## 2015-03-05 NOTE — Progress Notes (Signed)
ID: [name]   DOB: [birth year]  MR#: [id number]  RAQ#:[id number]  PCP: [name], NP GYN:  SU: [facility] OTHER KT:[facility] [name], Shanon Ace, Roney Jaffe  CHIEF COMPLAINT:  Estrogen receptor positive Breast Cancer; Ovarian cancer; DVT  CURRENT TREATMENT: Letrozole; abraxane; lovenox   BREAST CANCER HISTORY: From the original consult note:  [name] noted a mass in her right breast mid December 2013, and as it did not spontaneously resolve over a couple of weeks she brought it to her primary physician's attention. She was set up for diagnostic mammography and right breast ultrasonography at the breast Center 05/10/2012. (Note that the patient's most recent prior mammography had been in October 2008). The current study showed a spiculated mass in the superior subareolar portion of the right breast measuring approximately 5 cm and associated with pleomorphic calcifications. This was firm and palpable. There was right nipple retraction and skin thickening. Ultrasound confirmed an irregularly marginated hypoechoic mass measuring 3.5 cm by ultrasound, and an abnormal appearing lower right axillary lymph node measuring 2.6 cm.  Biopsies of both the breast mass and the abnormal appearing lymph node were performed 05/21/2012. Both showed an invasive ductal carcinoma, grade 2, with similar prognostic panels (the breast mass was 100% estrogen and 73% progesterone receptor positive, with an MIB-1 of 5%; the lymph node was 100% estrogen 100% progesterone receptor positive, with an MIB-1 of 20%). Both masses were HER-2 negative.  Breast MRI obtained at [facility] imaging 05/29/2012 confirmed a dominant mass in the retroareolar right breast measuring 4.4 cm maximally. There was a satellite nodule inferior and lateral to this mass, measuring 1.7 cm. There were no other masses in either breast. Aside from the previously biopsied lymph node there were other mildly  enhancing level I right axillary lymph nodes which did not appear pathologic. There was no other lymphadenopathy noted.   The patient's subsequent history is as detailed below.  OVARIAN CANCER HISTORY: From the 06/16/2014 summary note:  "[name] returns today for review of her restaging studies accompanied by her husband [name]. To summarize her recent history: She was admitted to [facility] with a diagnosis of possible pneumonia in late December 2015. Chest x-ray showed some increased density in the right upper lobe. CT scan was suggested and was obtained by Dr. Inda Castle. This showed 2 small nodules in the right middle lobe, measuring 8 and 4 mm respectively. Dr. Inda Castle then contacted [name] for further evaluation and we set Alys up for a PET scan and which was performed late January. This showed the 2 nodules in the lung not to be hypermetabolic. This of course does not prove that they are not malignant. More importantly, there was a 13.7 cm cystic/solid mass in the upper pelvis associated with adenopathy,. All of this was hypermetabolic."  The patient was evaluated by gynecologic oncology and on 06/27/2014 she underwent surgery under Dr. Denman George. This consisted of an exploratory laparotomy with hysterectomy and abdominal salpingo-oophorectomy, as well as tumor debulking and omentectomy. 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 The tumor measured 11.5 cm, focally involve the ovarian capsule on the left; the uterus and right ovary and fallopian tube were unremarkable. One para-aortic lymph node was positive out of a total of 6 lymph nodes sampled (2 left common iliac, 2 left para-aortic, and to within the omental resection).  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 Today is  day 1 cycle 6 (dose #11)of 6 planned cycles of abraxane given every 2 weeks (12 doses). --Since her last visit here she underwent IVC filter retrieval on 02/21/2015. There were no complications.  REVIEW OF SYSTEMS: [name] denies fevers, chills, nausea, vomiting, or changes in bowel or bladder habits. She is eating well and gaining weight. She is down to supplementing with just 1 Boost shake daily. She denies mouth sores and rashes. She has continued numbness to the pinky finger on her left finger, but the ring finger is no longer affected. She has excellent energy after the first 2 days past treatment. A detailed review of systems today was otherwise stable.  PAST MEDICAL HISTORY: Past Medical History  Diagnosis Date  . Depression   . DVT (deep venous thrombosis) (HCC)     hx of on HRT left leg ~2006  . GERD (gastroesophageal reflux disease)   . Hyperlipidemia   . Hypertension   . Hypothyroidism   . PPD positive, treated     rx inh   . Diabetes mellitus   . Liver disease, chronic, with cirrhosis ([place])     ? autoimmune  . DJD (degenerative joint disease) of lumbar spine   . Chronic combined systolic and diastolic CHF (congestive heart failure) ([place])     a. 08/2012 Echo: EF 55-60%, no rwma, mod MR;  b. 07/2014 Echo: EF 45-50%, distal antsept HK, mod TR/MR, mildly bil-atrial enlargement.  . Paroxysmal atrial fibrillation (HCC)     a. CHA2DS2VASc = 4-->coumadin.  [name] Physical deconditioning   . Allergy   . Anemia     low iron hx  . Cataract     removed ou  . OSA on CPAP     cpap 4.5 setting  . Breast cancer ([place]) 2014    a. 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 Clear Cell Ovarian Cancer     a. 06/2014 s/p TAH/BSO debulking/lymph node dissection;  b. Now on chemo.    PAST SURGICAL HISTORY: Past Surgical History  Procedure Laterality Date  . Tubal ligation    .  Cholecystectomy    . Foot surgery    . Eye surgery    . Cataract extraction    . Percutaneous liver biopsy    . Breast biopsy      left breast  . Mastectomy modified radical  06/08/2012    Procedure: MASTECTOMY MODIFIED RADICAL;  Surgeon: Edward Jolly, MD;  Location: [facility];  Service: General;  Laterality: Right;  . Portacath placement  06/08/2012    Procedure: INSERTION PORT-A-CATH;  Surgeon: Edward Jolly, MD;  Location: [facility];  Service: General;  Laterality: Left;  . Peg tube placement      peg removed 2015  . History of chemotherapy x 2 treatments, radiation tx  2014  . Pac removed  2015  . Laparotomy N/A 06/27/2014    Procedure: EXPLORATORY LAPAROTOMY;  Surgeon: Everitt Amber, MD;  Location: [facility];  Service: Gynecology;  Laterality: N/A;  . Abdominal hysterectomy N/A 06/27/2014    Procedure: HYSTERECTOMY ABDOMINAL TOTAL;  Surgeon: Everitt Amber, MD;  Location: [facility];  Service: Gynecology;  Laterality: N/A;  . Salpingoophorectomy Bilateral 06/27/2014    Procedure: BILATERAL SALPINGO OOPHORECTOMY/TUMOR DEBULKING/LYMPHNODE DISSECTION, OMENTECTOMY;  Surgeon: Everitt Amber, MD;  Location: [facility];  Service: Gynecology;  Laterality: Bilateral;    FAMILY HISTORY Family History  Problem Relation Age of Onset  . Diabetes Mother   . Hypertension Mother   . Arthritis Mother   . Heart disease Mother   . Heart failure Mother   . Other Mother     benign breast mass  . Stroke Father   . Heart disease Father   . Dementia Father   . Diabetes Paternal Grandmother   . Colon cancer Paternal Grandfather     dx. 70s-80s  . Other Daughter     potentially has had negative BRCA testing  . Other Daughter     hysterectomy; potentially has had negative genetic testing   the patient's father died in his 62s with a history of dementia. He had had prior strokes. The patient's mother died in her 82s, with a history of congestive heart failure. [name] had no brothers, one sister. There is no history of  breast or ovarian cancer in the family.  GYNECOLOGIC HISTORY: Menarche age 75, first live birth age 46, she is GX P2, menopause approximately 15 years ago, on hormone replacement until 2010.  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 Her husband [name] works for [employer]. Daughter Paul Half is a Physiological scientist and lives in [place]. Daughter Santiago Bur and her family (husband and 2 children aged 28 and 3 years) currently live with the patient. Namya is a member of a [employer].   ADVANCED DIRECTIVES: Not in place  HEALTH MAINTENANCE: (Updated October 2014) Social History  Substance Use Topics  . Smoking status: Former Smoker -- 1.00 packs/day for 1 years  . Smokeless tobacco: Never Used  . Alcohol Use: No     Colonoscopy: Never  PAP: Does not recall  Bone density: Never  Lipid panel:   Allergies  Allergen Reactions  . Olmesartan Medoxomil Cough  . Adhesive [Tape] Rash  . Tetracycline Hcl Other (See Comments)  . Venlafaxine Other (See Comments)    severe dry mouth    Current Outpatient Prescriptions  Medication Sig Dispense Refill  . acetaminophen (TYLENOL) 500 MG tablet Take 500 mg by mouth every 6 (six) hours as needed for moderate pain.    [name] ALPRAZolam (XANAX XR) 1 MG 24 hr tablet Take 1 tablet (1 mg total) by mouth 2 (two) times daily. 180 tablet 0  . buPROPion (WELLBUTRIN XL) 150 MG 24 hr tablet Take 1 tablet (150 mg total) by mouth daily. 90 tablet 0  . clonazePAM (KLONOPIN) 0.5 MG tablet TAKE 1/2 TABLET TWICE DAILY 30 tablet 0  . docusate sodium (COLACE) 100 MG capsule Take 1 capsule (100 mg total) by mouth 2 (two) times daily. (Patient not taking: Reported on 02/16/2015) 20 capsule 0  . enoxaparin (LOVENOX) 100 MG/ML injection Inject 1 mL (100 mg total) into the skin daily. (Patient taking differently: Inject 100 mg into the skin at  bedtime. ) 90 Syringe 2  . escitalopram (LEXAPRO) 20 MG tablet Take 20 mg by mouth every morning.     . famotidine (PEPCID) 20 MG tablet Take 1 tablet (20 mg total) by mouth daily. (Patient taking differently: Take 20 mg by mouth daily as needed for heartburn or indigestion. ) 30 tablet 0  . furosemide (LASIX) 20 MG tablet Take 1 tablet (20 mg total) by mouth daily. 90 tablet 1  . letrozole (FEMARA) 2.5 MG tablet Take 1  tablet (2.5 mg total) by mouth daily. 90 tablet 0  . levothyroxine (SYNTHROID, LEVOTHROID) 150 MCG tablet Take 1 tablet (150 mcg total) by mouth daily. 90 tablet 1  . lidocaine-prilocaine (EMLA) cream Apply over port area 1-2 hours before chemotherapy 30 g 0  . metoprolol (LOPRESSOR) 50 MG tablet Take 1 tablet (50 mg total) by mouth 2 (two) times daily. 180 tablet 1  . Nutritional Supplements (ENSURE HIGH PROTEIN PO) Take 237 mLs by mouth 2 (two) times daily.    [name] OLANZapine (ZYPREXA) 2.5 MG tablet Take 2.5 mg by mouth at bedtime.    . ondansetron (ZOFRAN) 8 MG tablet Take 1 tablet (8 mg total) by mouth 2 (two) times daily. Start the day after chemo for 2 days. Then take as needed for nausea or vomiting. 30 tablet 1  . potassium chloride (K-DUR) 10 MEQ tablet Take 1 tablet (10 mEq total) by mouth 2 (two) times daily. 180 tablet 1  . prochlorperazine (COMPAZINE) 10 MG tablet Take 1 tablet (10 mg total) by mouth every 6 (six) hours as needed (Nausea or vomiting). 30 tablet 1   No current facility-administered medications for this visit.    OBJECTIVE: Middle-aged white woman in no acute distress There were no vitals filed for this visit.   There is no weight on file to calculate BMI.    ECOG FS: 1 There were no vitals filed for this visit. Skin: warm, dry  HEENT: sclerae anicteric, conjunctivae pink, oropharynx clear. No thrush or mucositis.  Lymph Nodes: No cervical or supraclavicular lymphadenopathy  Lungs: clear to auscultation bilaterally, no rales, wheezes, or rhonci  Heart:  regular rate and rhythm, soft systolic murmur noted Abdomen: round, soft, non tender, positive bowel sounds  Musculoskeletal: No focal spinal tenderness, no peripheral edema  Neuro: non focal, well oriented, positive affect  Breasts: deferred  Lab Results  Component Value Date   WBC 44.9* 02/21/2015   NEUTROABS 44.0* 02/21/2015   HGB 10.9* 02/21/2015   HCT 35.4* 02/21/2015   MCV 87.4 02/21/2015   PLT 121* 02/21/2015  Results for Rhonda, [name] (MRN [id number]) as of 02/19/2015 09:51  Ref. Range 11/16/2014 10:54 11/24/2014 07:49 12/25/2014 09:55 01/08/2015 09:05 02/05/2015 08:03  CA 125 Latest Ref Range: <35 U/mL 86 (H) 93 (H) 88 (H) 103 (H) 77 (H)       Chemistry      Component Value Date/Time   NA 137 02/21/2015 0955   NA 143 02/19/2015 0849   K 4.0 02/21/2015 0955   K 4.2 02/19/2015 0849   CL 112* 02/21/2015 0955   CL 104 10/29/2012 1058   CO2 19* 02/21/2015 0955   CO2 23 02/19/2015 0849   BUN 10 02/21/2015 0955   BUN 7.2 02/19/2015 0849   CREATININE 0.45 02/21/2015 0955   CREATININE 0.7 02/19/2015 0849   CREATININE 0.69 11/10/2013 0828      Component Value Date/Time   CALCIUM 8.2* 02/21/2015 0955   CALCIUM 8.8 02/19/2015 0849   ALKPHOS 194* 02/19/2015 0849   ALKPHOS 152* 08/18/2014 1320   AST 35* 02/19/2015 0849   AST 40* 08/18/2014 1320   ALT 22 02/19/2015 0849   ALT 20 08/18/2014 1320   BILITOT 0.49 02/19/2015 0849   BILITOT 0.8 08/18/2014 1320     STUDIES: Ir Ivc Filter Retrieval / S&i /img Guid/mod Sed  02/21/2015  CLINICAL DATA:  59 year old with history of breast cancer and bilateral lower extremity DVT. An IVC filter was placed due to a mobile thrombus in  the right groin. The mobile thrombus has resolved. IVC filter is no longer needed and patient is continuing anticoagulation. EXAM: IVC FILTER RETRIEVAL ; ULTRASOUND GUIDANCE FOR VASCULAR ACCESS; IVC VENOGRAM Physician: Stephan Minister. Henn, MD FLUOROSCOPY TIME:  7 minutes and 24 seconds.  453 mGy MEDICATIONS AND  MEDICAL HISTORY: 1 mg versed, 25 mcg fentanyl. A radiology nurse monitored the patient for moderate sedation. ANESTHESIA/SEDATION: Moderate sedation time: 17 minutes CONTRAST:  80 mL Omnipaque-300 PROCEDURE: The procedure was explained to the patient. The risks and benefits of the procedure were discussed and the patient's questions were addressed. Informed consent was obtained from the patient. The right neck was prepped and draped in sterile fashion. Maximal barrier sterile technique was utilized including caps, mask, sterile gowns, sterile gloves, sterile drape, hand hygiene and skin antiseptic. Ultrasound confirmed a patent right internal jugular vein. Skin was anesthetized with 1% lidocaine. 21 gauge needle directed into the right internal jugular vein with ultrasound guidance. Micropuncture set was placed. A 6 French catheter was placed. A 5 French catheter was advanced into the IVC and a wire was placed. A 5 French pigtail catheter was used to perform an IVC venogram. The pigtail catheter and 6 [place] vascular sheath were exchanged for the Bard retrieval sheath. The [facility] filter hook was easily snared and the retrieval sheath was advanced over the filter. The sheath easily advanced over the filter shoulders and the remainder of the filter was pulled into the sheath. The inner sheath and filter were removed. The outer sheath was advanced to the lower SVC over a wire. Follow-up venogram was performed. Sheath was removed with manual compression. Bandage placed over the puncture site. Fluoroscopic and ultrasound images were taken and saved for documentation. FINDINGS: Filter was well positioned below the renal veins. No clot identified in the IVC or the filter. Post filter retrieval: IVC was patent without significant narrowing or extravasation. Estimated blood loss: Minimal COMPLICATIONS: None IMPRESSION: Successful retrieval of the IVC filter. Electronically Signed   By: Markus Daft M.D.   On: 02/21/2015 18:10     ASSESSMENT: 59 y.o.  BRCA negative [name]   (1)  status post right mastectomy  06/08/2012 for a pT3, pN1a, stage IIIA invasive ductal carcinoma, grade 2,  estrogen and progesterone receptor positive, HER-2/neu negative, with an MIB-1 of 20%.    (2)  treated in the adjuvant setting with docetaxel/ cyclophosphamide given every 3 weeks. There were multiple and severe complications, and the  patient tolerated only two cycles, last dose 07/29/2012  (3) letrozole started 08/16/2012  (a) osteopenia, with a T score of -1.14 April 2013  (4) postmastectomy radiation completed 12/24/2012  (5)  comorbidities include diabetes, hypertension, chronic liver disease with cirrhosis and fatty liver, and nutritional disturbance with temporarily dependence on PEG feeds (discontinued July 2014).  (6) restaging studies January 2016 show  (a) 8 mm and 4 mm Right middle lobe lung nodules, not metabolically active on PET -- Rhonda require follow-up  (b) hypermetabolic 63.1 cm mixed solid/cystic lesion in upper pelvis, with associated adenopathy  (7) status post exploratory laparotomy 06/27/2014 with total abdominal hysterectomy, bilateral salpingo-oophorectomy, para-aortic lymphadenectomy, omentectomy and radical tumor debulking for a clear cell ovarian cancer, pT1c pN1, stage IIIC .  (8) adjuvant chemotherapy consisting of carboplatin and paclitaxel given weekly days 1 and 8 of each 21 day cycle, for 6-8 cycles as tolerated, started 07/17/2014, with onpro support  (a) day 8 cycle 2 omitted and further treatments cancelled because of an episode of  Klebsiella sepsis requiring intensive care unit admission  (9) bilateral lower extremity DVTs documented 09/25/2014 despite being on Coumadin for PAF (subtherapeutic INR) with mobile Right common femoral clot  (a) status post thrombolysis 09/30/2014  (b) on Lovenox daily as of 10/02/2014  (c) IVC filter placed 10/26/2014 as mobile clot still noted Right  groin  (d) IVC filter to be removed 01/30/2015 if repeat dopplers before that allow it  (10) started abraxane 10/13/2014, to be repeated every 14 days for 6 cycles (12 doses)  (11) genetics testing December 04, 2014 through the Breast[facility]Ovarian Cancer Panel [facility] no deleterious mutations in ATM, BARD1, BRCA1, BRCA2, BRIP1, CDH1, CHEK2, FANCC, MLH1, MSH2, MSH6, NBN, PALB2, PMS2, PTEN, RAD51C, RAD51D, STK11, TP53, and XRCC2.    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   PLAN: [name]  Is doing very well clinically. Labs today are pending but I expect them to be fine and so we Rhonda proceed with the beginning of cycle 6 today. The "day 8" treatment is going to be delayed because they want to take a week's vacation in [place] after going down there for a wedding November 4. I reassured her there is no problem with that.  She needed [facility] of [facility]'re [facility] now. I was glad to do that for her.  She is already scheduled to have a PET scan 04/09/2015 and Rhonda see me again 04/03/2015 to discuss results.   I encouraged [name] to remain active, and in addition to housework take little walks if she feels up to it.[name] knows to call for any problems that may develop before her next visit here.   Chauncey Cruel, MD    03/05/2015

## 2015-03-05 NOTE — Patient Instructions (Signed)
[facility] Discharge Instructions for Patients Receiving Chemotherapy  Today you received the following chemotherapy agents abraxane.   To help prevent nausea and vomiting after your treatment, we encourage you to take your nausea medication as directed.   If you develop nausea and vomiting that is not controlled by your nausea medication, call the clinic.   BELOW ARE SYMPTOMS THAT SHOULD BE REPORTED IMMEDIATELY:  *FEVER GREATER THAN 100.5 F  *CHILLS WITH OR WITHOUT FEVER  NAUSEA AND VOMITING THAT IS NOT CONTROLLED WITH YOUR NAUSEA MEDICATION  *UNUSUAL SHORTNESS OF BREATH  *UNUSUAL BRUISING OR BLEEDING  TENDERNESS IN MOUTH AND THROAT WITH OR WITHOUT PRESENCE OF ULCERS  *URINARY PROBLEMS  *BOWEL PROBLEMS  UNUSUAL RASH Items with * indicate a potential emergency and should be followed up as soon as possible.  Feel free to call the clinic you have any questions or concerns. The clinic phone number is [phone].  

## 2015-03-05 NOTE — Progress Notes (Signed)
Received a request for coupons for ensure from nurses in infusion. Contacted patient by phone who reports they have just left the cancer Center. Offered to mail coupons to patient.  Patient's husband was very Patent attorney. Coupons placed in mail today.

## 2015-03-05 NOTE — Telephone Encounter (Signed)
Spoke with pt's spouse. Insurance is now under Medicare Part B and pt will not be using [facility] any longer. Requests Rxs go to [facility].  Refills sent.

## 2015-03-06 ENCOUNTER — Other Ambulatory Visit: Payer: Self-pay | Admitting: *Deleted

## 2015-03-06 DIAGNOSIS — C50911 Malignant neoplasm of unspecified site of right female breast: Secondary | ICD-10-CM

## 2015-03-06 LAB — CA 125: CA 125: 70 U/mL — AB (ref <35)

## 2015-03-06 MED ORDER — CLONAZEPAM 0.5 MG PO TABS: 0.2500 mg | ORAL_TABLET | Freq: Two times a day (BID) | ORAL | Status: DC

## 2015-03-06 MED ORDER — FUROSEMIDE 20 MG PO TABS: 20.0000 mg | ORAL_TABLET | Freq: Every day | ORAL | Status: DC

## 2015-03-06 MED ORDER — LETROZOLE 2.5 MG PO TABS: 2.5000 mg | ORAL_TABLET | Freq: Every day | ORAL | Status: DC

## 2015-03-06 MED ORDER — ALPRAZOLAM ER 1 MG PO TB24: 1.0000 mg | ORAL_TABLET | Freq: Two times a day (BID) | ORAL | Status: DC

## 2015-03-06 MED ORDER — ENOXAPARIN SODIUM 100 MG/ML ~~LOC~~ SOLN: 100.0000 mg | Freq: Every day | SUBCUTANEOUS | Status: DC

## 2015-03-07 ENCOUNTER — Other Ambulatory Visit: Payer: Self-pay | Admitting: *Deleted

## 2015-03-19 ENCOUNTER — Other Ambulatory Visit: Payer: Self-pay

## 2015-03-19 ENCOUNTER — Ambulatory Visit: Payer: Self-pay | Admitting: Oncology

## 2015-03-19 ENCOUNTER — Ambulatory Visit: Payer: Self-pay

## 2015-03-26 ENCOUNTER — Encounter: Payer: Self-pay | Admitting: Oncology

## 2015-03-26 ENCOUNTER — Ambulatory Visit (HOSPITAL_BASED_OUTPATIENT_CLINIC_OR_DEPARTMENT_OTHER): Payer: Medicare Other | Admitting: Oncology

## 2015-03-26 ENCOUNTER — Ambulatory Visit (HOSPITAL_BASED_OUTPATIENT_CLINIC_OR_DEPARTMENT_OTHER): Payer: Medicare Other

## 2015-03-26 ENCOUNTER — Other Ambulatory Visit (HOSPITAL_BASED_OUTPATIENT_CLINIC_OR_DEPARTMENT_OTHER): Payer: Medicare Other

## 2015-03-26 VITALS — BP 121/64 | HR 72 | Temp 98.0°F | Resp 18 | Ht 65.0 in | Wt 167.4 lb

## 2015-03-26 DIAGNOSIS — C569 Malignant neoplasm of unspecified ovary: Secondary | ICD-10-CM

## 2015-03-26 DIAGNOSIS — G620 Drug-induced polyneuropathy: Secondary | ICD-10-CM

## 2015-03-26 DIAGNOSIS — Z86718 Personal history of other venous thrombosis and embolism: Secondary | ICD-10-CM

## 2015-03-26 DIAGNOSIS — C50911 Malignant neoplasm of unspecified site of right female breast: Secondary | ICD-10-CM

## 2015-03-26 DIAGNOSIS — Z5111 Encounter for antineoplastic chemotherapy: Secondary | ICD-10-CM

## 2015-03-26 DIAGNOSIS — Z5189 Encounter for other specified aftercare: Secondary | ICD-10-CM

## 2015-03-26 LAB — CBC WITH DIFFERENTIAL/PLATELET
BASO%: 0.5 % (ref 0.0–2.0)
BASOS ABS: 0 10*3/uL (ref 0.0–0.1)
EOS ABS: 0.1 10*3/uL (ref 0.0–0.5)
EOS%: 2.1 % (ref 0.0–7.0)
HCT: 33.9 % — ABNORMAL LOW (ref 34.8–46.6)
HGB: 10.7 g/dL — ABNORMAL LOW (ref 11.6–15.9)
LYMPH%: 17.6 % (ref 14.0–49.7)
MCH: 26.7 pg (ref 25.1–34.0)
MCHC: 31.6 g/dL (ref 31.5–36.0)
MCV: 84.4 fL (ref 79.5–101.0)
MONO#: 0.4 10*3/uL (ref 0.1–0.9)
MONO%: 12 % (ref 0.0–14.0)
NEUT#: 2.2 10*3/uL (ref 1.5–6.5)
NEUT%: 67.8 % (ref 38.4–76.8)
Platelets: 153 10*3/uL (ref 145–400)
RBC: 4.01 10*6/uL (ref 3.70–5.45)
RDW: 23.5 % — ABNORMAL HIGH (ref 11.2–14.5)
WBC: 3.3 10*3/uL — AB (ref 3.9–10.3)
lymph#: 0.6 10*3/uL — ABNORMAL LOW (ref 0.9–3.3)

## 2015-03-26 LAB — COMPREHENSIVE METABOLIC PANEL (CC13)
ALT: 19 U/L (ref 0–55)
AST: 37 U/L — ABNORMAL HIGH (ref 5–34)
Albumin: 3.2 g/dL — ABNORMAL LOW (ref 3.5–5.0)
Alkaline Phosphatase: 166 U/L — ABNORMAL HIGH (ref 40–150)
Anion Gap: 7 mEq/L (ref 3–11)
BUN: 7.4 mg/dL (ref 7.0–26.0)
CO2: 25 meq/L (ref 22–29)
Calcium: 8.9 mg/dL (ref 8.4–10.4)
Chloride: 109 mEq/L (ref 98–109)
Creatinine: 0.6 mg/dL (ref 0.6–1.1)
GLUCOSE: 74 mg/dL (ref 70–140)
POTASSIUM: 4.2 meq/L (ref 3.5–5.1)
SODIUM: 141 meq/L (ref 136–145)
Total Bilirubin: 0.55 mg/dL (ref 0.20–1.20)
Total Protein: 6.2 g/dL — ABNORMAL LOW (ref 6.4–8.3)

## 2015-03-26 MED ORDER — HEPARIN SOD (PORK) LOCK FLUSH 100 UNIT/ML IV SOLN
500.0000 [IU] | Freq: Once | INTRAVENOUS | Status: AC | PRN
  Administered 2015-03-26: 500 [IU]
  Filled 2015-03-26: qty 5

## 2015-03-26 MED ORDER — PACLITAXEL PROTEIN-BOUND CHEMO INJECTION 100 MG
100.0000 mg/m2 | Freq: Once | INTRAVENOUS | Status: AC
  Administered 2015-03-26: 175 mg via INTRAVENOUS
  Filled 2015-03-26: qty 35

## 2015-03-26 MED ORDER — PEGFILGRASTIM 6 MG/0.6ML ~~LOC~~ PSKT
6.0000 mg | PREFILLED_SYRINGE | Freq: Once | SUBCUTANEOUS | Status: AC
  Administered 2015-03-26: 6 mg via SUBCUTANEOUS
  Filled 2015-03-26: qty 0.6

## 2015-03-26 MED ORDER — SODIUM CHLORIDE 0.9 % IJ SOLN
10.0000 mL | INTRAMUSCULAR | Status: DC | PRN
  Administered 2015-03-26: 10 mL
  Filled 2015-03-26: qty 10

## 2015-03-26 MED ORDER — SODIUM CHLORIDE 0.9 % IV SOLN
Freq: Once | INTRAVENOUS | Status: AC
  Administered 2015-03-26: 12:00:00 via INTRAVENOUS

## 2015-03-26 MED ORDER — SODIUM CHLORIDE 0.9 % IV SOLN
Freq: Once | INTRAVENOUS | Status: AC
  Administered 2015-03-26: 12:00:00 via INTRAVENOUS
  Filled 2015-03-26: qty 4

## 2015-03-26 NOTE — Progress Notes (Signed)
ID: [name]   DOB: 09/22/1955  MR#: [id number]  CSN#:[id number]  PCP: Rhonda S., NP GYN:  SU: [name] OTHER MD:[name], [name], [name], [name]  CHIEF COMPLAINT:  Estrogen receptor positive Breast Cancer; Ovarian cancer; DVT  CURRENT TREATMENT: Letrozole; abraxane; lovenox   BREAST CANCER HISTORY: From the original consult note:  [name] noted a mass in her right breast mid December 2013, and as it did not spontaneously resolve over a couple of weeks she brought it to her primary physician's attention. She was set up for diagnostic mammography and right breast ultrasonography at the [facility] Center 05/10/2012. (Note that the patient's most recent prior mammography had been in October 2008). The current study showed a spiculated mass in the superior subareolar portion of the right breast measuring approximately 5 cm and associated with pleomorphic calcifications. This was firm and palpable. There was right nipple retraction and skin thickening. Ultrasound confirmed an irregularly marginated hypoechoic mass measuring 3.5 cm by ultrasound, and an abnormal appearing lower right axillary lymph node measuring 2.6 cm.  Biopsies of both the breast mass and the abnormal appearing lymph node were performed 05/21/2012. Both showed an invasive ductal carcinoma, grade 2, with similar prognostic panels (the breast mass was 100% estrogen and 73% progesterone receptor positive, with an MIB-1 of 5%; the lymph node was 100% estrogen 100% progesterone receptor positive, with an MIB-1 of 20%). Both masses were HER-2 negative.  Breast MRI obtained at [facility] 05/29/2012 confirmed a dominant mass in the retroareolar right breast measuring 4.4 cm maximally. There was a satellite nodule inferior and lateral to this mass, measuring 1.7 cm. There were no other masses in either breast. Aside from the previously biopsied lymph node there were other mildly  enhancing level I right axillary lymph nodes which did not appear pathologic. There was no other lymphadenopathy noted.   The patient's subsequent history is as detailed below.  OVARIAN CANCER HISTORY: From the 06/16/2014 summary note:  "[name] returns today for review of her restaging studies accompanied by her husband [name]. To summarize her recent history: She was admitted to [facility] with a diagnosis of possible pneumonia in late December 2015. Chest x-ray showed some increased density in the right upper lobe. CT scan was suggested and was obtained by [name]. This showed 2 small nodules in the right middle lobe, measuring 8 and 4 mm respectively. [name] then contacted us for further evaluation and we set [name] up for a PET scan and which was performed late January. This showed the 2 nodules in the lung not to be hypermetabolic. This of course does not prove that they are not malignant. More importantly, there was a 13.7 cm cystic/solid mass in the upper pelvis associated with adenopathy,. All of this was hypermetabolic."  The patient was evaluated by gynecologic oncology and on 06/27/2014 she underwent surgery under Dr. Rossi. This consisted of an exploratory laparotomy with hysterectomy and abdominal salpingo-oophorectomy, as well as tumor debulking and omentectomy. 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 The tumor measured 11.5 cm, focally involve the ovarian capsule on the left; the uterus and right ovary and fallopian tube were unremarkable. One para-aortic lymph node was positive out of a total of 6 lymph nodes sampled (2 left common iliac, 2 left para-aortic, and to within the omental resection).  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 Today is  day 15 cycle 6 of 6 planned cycles of abraxane given every 2 weeks. She recently returned from traveling and had a good time.   REVIEW OF SYSTEMS: [name] denies fevers, chills, nausea, vomiting, or changes in bowel or bladder habits. She is eating well and gaining weight. She is down to supplementing with just 1 Ensure daily. She denies mouth sores and rashes. She has continued numbness to the pinky finger on her left finger, but the ring finger is no longer affected. She has excellent energy after the first 2 days past treatment. A detailed review of systems today was otherwise stable.  PAST MEDICAL HISTORY: Past Medical History  Diagnosis Date  . Depression   . DVT (deep venous thrombosis) (HCC)     hx of on HRT left leg ~2006  . GERD (gastroesophageal reflux disease)   . Hyperlipidemia   . Hypertension   . Hypothyroidism   . PPD positive, treated     rx inh   . Diabetes mellitus   . Liver disease, chronic, with cirrhosis (HCC)     ? autoimmune  . DJD (degenerative joint disease) of lumbar spine   . Chronic combined systolic and diastolic CHF (congestive heart failure) (HCC)     a. 08/2012 Echo: EF 55-60%, no rwma, mod MR;  b. 07/2014 Echo: EF 45-50%, distal antsept HK, mod TR/MR, mildly bil-atrial enlargement.  . Paroxysmal atrial fibrillation (HCC)     a. CHA2DS2VASc = 4-->coumadin.  . Physical deconditioning   . Allergy   . Anemia     low iron hx  . Cataract     removed ou  . OSA on CPAP     cpap 4.5 setting  . Breast cancer (HCC) 2014    a. 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 Clear Cell Ovarian Cancer     a. 06/2014 s/p TAH/BSO debulking/lymph node dissection;  b. Now on chemo.    PAST SURGICAL HISTORY: Past Surgical History  Procedure Laterality Date  . Tubal ligation    . Cholecystectomy    . Foot surgery    . Eye surgery    . Cataract extraction    .  Percutaneous liver biopsy    . Breast biopsy      left breast  . Mastectomy modified radical  06/08/2012    Procedure: MASTECTOMY MODIFIED RADICAL;  Surgeon: Rhonda T Hoxworth, MD;  Location: [facility] OR;  Service: General;  Laterality: Right;  . Portacath placement  06/08/2012    Procedure: INSERTION PORT-A-CATH;  Surgeon: Rhonda T Hoxworth, MD;  Location: [facility] OR;  Service: General;  Laterality: Left;  . Peg tube placement      peg removed 2015  . History of chemotherapy x 2 treatments, radiation tx  2014  . Pac removed  2015  . Laparotomy N/A 06/27/2014    Procedure: EXPLORATORY LAPAROTOMY;  Surgeon: Emma Rossi, MD;  Location: [facility];  Service: Gynecology;  Laterality: N/A;  . Abdominal hysterectomy N/A 06/27/2014    Procedure: HYSTERECTOMY ABDOMINAL TOTAL;  Surgeon: Emma Rossi, MD;  Location: [facility];  Service: Gynecology;  Laterality: N/A;  . Salpingoophorectomy Bilateral 06/27/2014    Procedure: BILATERAL SALPINGO OOPHORECTOMY/TUMOR DEBULKING/LYMPHNODE DISSECTION, OMENTECTOMY;  Surgeon: Emma Rossi, MD;  Location: [facility];  Service: Gynecology;  Laterality: Bilateral;      FAMILY HISTORY Family History  Problem Relation Age of Onset  . Diabetes Mother   . Hypertension Mother   . Arthritis Mother   . Heart disease Mother   . Heart failure Mother   . Other Mother     benign breast mass  . Stroke Father   . Heart disease Father   . Dementia Father   . Diabetes Paternal Grandmother   . Colon cancer Paternal Grandfather     dx. 70s-80s  . Other Daughter     potentially has had negative BRCA testing  . Other Daughter     hysterectomy; potentially has had negative genetic testing   the patient's father died in his 35s with a history of dementia. He had had prior strokes. The patient's mother died in her 77s, with a history of congestive heart failure. Nazariah had no brothers, one sister. There is no history of breast or ovarian cancer in the family.  GYNECOLOGIC HISTORY: Menarche age 89,  first live birth age 1, she is GX P2, menopause approximately 15 years ago, on hormone replacement until 2010.  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 Her husband [name] works for [employer]. Daughter Paul Half is a Physiological scientist and lives in [place]. Daughter Santiago Bur and her family (husband and 2 children aged 36 and 3 years) currently live with the patient. Analynn is a member of a [employer].   ADVANCED DIRECTIVES: Not in place  HEALTH MAINTENANCE: (Updated October 2014) Social History  Substance Use Topics  . Smoking status: Former Smoker -- 1.00 packs/day for 1 years  . Smokeless tobacco: Never Used  . Alcohol Use: No     Colonoscopy: Never  PAP: Does not recall  Bone density: Never  Lipid panel:   Allergies  Allergen Reactions  . Olmesartan Medoxomil Cough  . Adhesive [Tape] Rash  . Tetracycline Hcl Other (See Comments)  . Venlafaxine Other (See Comments)    severe dry mouth    Current Outpatient Prescriptions  Medication Sig Dispense Refill  . acetaminophen (TYLENOL) 500 MG tablet Take 500 mg by mouth every 6 (six) hours as needed for moderate pain.    [name] ALPRAZolam (XANAX XR) 1 MG 24 hr tablet Take 1 tablet (1 mg total) by mouth 2 (two) times daily. 180 tablet 0  . buPROPion (WELLBUTRIN XL) 150 MG 24 hr tablet Take 1 tablet (150 mg total) by mouth daily. 90 tablet 1  . clonazePAM (KLONOPIN) 0.5 MG tablet Take 0.5 tablets (0.25 mg total) by mouth 2 (two) times daily. 30 tablet 0  . docusate sodium (COLACE) 100 MG capsule Take 1 capsule (100 mg total) by mouth 2 (two) times daily. 20 capsule 0  . enoxaparin (LOVENOX) 100 MG/ML injection Inject 1 mL (100 mg total) into the skin at bedtime. 90 Syringe 2  . escitalopram (LEXAPRO) 20 MG tablet Take 1 tablet (20 mg total) by mouth every morning. 90 tablet 1  . famotidine (PEPCID) 20 MG  tablet Take 1 tablet (20 mg total) by mouth daily. (Patient taking differently: Take 20 mg by mouth daily as needed for heartburn or indigestion. ) 30 tablet 0  . furosemide (LASIX) 20 MG tablet Take 1 tablet (20 mg total) by mouth daily. 90 tablet 1  . letrozole (FEMARA) 2.5 MG tablet Take 1 tablet (2.5 mg total) by mouth daily. 90 tablet 4  . levothyroxine (SYNTHROID, LEVOTHROID)  150 MCG tablet Take 1 tablet (150 mcg total) by mouth daily. 90 tablet 1  . lidocaine-prilocaine (EMLA) cream Apply over port area 1-2 hours before chemotherapy 30 g 0  . metoprolol (LOPRESSOR) 50 MG tablet Take 1 tablet (50 mg total) by mouth 2 (two) times daily. 180 tablet 1  . Nutritional Supplements (ENSURE HIGH PROTEIN PO) Take 237 mLs by mouth 2 (two) times daily.    [name] OLANZapine (ZYPREXA) 2.5 MG tablet Take 1 tablet (2.5 mg total) by mouth at bedtime. 90 tablet 1  . ondansetron (ZOFRAN) 8 MG tablet Take 1 tablet (8 mg total) by mouth 2 (two) times daily. Start the day after chemo for 2 days. Then take as needed for nausea or vomiting. 30 tablet 1  . potassium chloride (K-DUR) 10 MEQ tablet Take 1 tablet (10 mEq total) by mouth 2 (two) times daily. 180 tablet 1  . prochlorperazine (COMPAZINE) 10 MG tablet Take 1 tablet (10 mg total) by mouth every 6 (six) hours as needed (Nausea or vomiting). 30 tablet 1   No current facility-administered medications for this visit.   Facility-Administered Medications Ordered in Other Visits  Medication Dose Route Frequency Provider Last Rate Last Dose  . sodium chloride 0.9 % injection 10 mL  10 mL Intracatheter PRN Chauncey Cruel, MD   10 mL at 03/26/15 1308    OBJECTIVE: Middle-aged white woman in no acute distress Filed Vitals:   03/26/15 0959  BP: 121/64  Pulse: 72  Temp: 98 F (36.7 C)  Resp: 18     Body mass index is 27.86 kg/(m^2).    ECOG FS: 1 Filed Weights   03/26/15 0959  Weight: 167 lb 6.4 oz (75.932 kg)   Skin: warm, dry  HEENT: sclerae anicteric,  conjunctivae pink, oropharynx clear. No thrush or mucositis.  Lymph Nodes: No cervical or supraclavicular lymphadenopathy  Lungs: clear to auscultation bilaterally, no rales, wheezes, or rhonci  Heart: regular rate and rhythm, soft systolic murmur noted Abdomen: round, soft, non tender, positive bowel sounds  Musculoskeletal: No focal spinal tenderness, no peripheral edema  Neuro: non focal, well oriented, positive affect  Breasts: deferred  Lab Results  Component Value Date   WBC 3.3* 03/26/2015   NEUTROABS 2.2 03/26/2015   HGB 10.7* 03/26/2015   HCT 33.9* 03/26/2015   MCV 84.4 03/26/2015   PLT 153 03/26/2015  Results for KIMBERY, HARWOOD (MRN [id number]) as of 02/19/2015 09:51  Ref. Range 11/16/2014 10:54 11/24/2014 07:49 12/25/2014 09:55 01/08/2015 09:05 02/05/2015 08:03  CA 125 Latest Ref Range: <35 U/mL 86 (H) 93 (H) 88 (H) 103 (H) 77 (H)       Chemistry      Component Value Date/Time   NA 141 03/26/2015 0946   NA 137 02/21/2015 0955   K 4.2 03/26/2015 0946   K 4.0 02/21/2015 0955   CL 112* 02/21/2015 0955   CL 104 10/29/2012 1058   CO2 25 03/26/2015 0946   CO2 19* 02/21/2015 0955   BUN 7.4 03/26/2015 0946   BUN 10 02/21/2015 0955   CREATININE 0.6 03/26/2015 0946   CREATININE 0.45 02/21/2015 0955   CREATININE 0.69 11/10/2013 0828      Component Value Date/Time   CALCIUM 8.9 03/26/2015 0946   CALCIUM 8.2* 02/21/2015 0955   ALKPHOS 166* 03/26/2015 0946   ALKPHOS 152* 08/18/2014 1320   AST 37* 03/26/2015 0946   AST 40* 08/18/2014 1320   ALT 19 03/26/2015 0946   ALT 20 08/18/2014 1320  BILITOT 0.55 03/26/2015 0946   BILITOT 0.8 08/18/2014 1320     STUDIES: No results found.  ASSESSMENT: 59 y.o.  BRCA negative [name]   (1)  status post right mastectomy  06/08/2012 for a pT3, pN1a, stage IIIA invasive ductal carcinoma, grade 2,  estrogen and progesterone receptor positive, HER-2/neu negative, with an MIB-1 of 20%.    (2)  treated in the adjuvant setting  with docetaxel/ cyclophosphamide given every 3 weeks. There were multiple and severe complications, and the  patient tolerated only two cycles, last dose 07/29/2012  (3) letrozole started 08/16/2012  (a) osteopenia, with a T score of -1.14 April 2013  (4) postmastectomy radiation completed 12/24/2012  (5)  comorbidities include diabetes, hypertension, chronic liver disease with cirrhosis and fatty liver, and nutritional disturbance with temporarily dependence on PEG feeds (discontinued July 2014).  (6) restaging studies January 2016 show  (a) 8 mm and 4 mm Right middle lobe lung nodules, not metabolically active on PET -- will require follow-up  (b) hypermetabolic 13.7 cm mixed solid/cystic lesion in upper pelvis, with associated adenopathy  (7) status post exploratory laparotomy 06/27/2014 with total abdominal hysterectomy, bilateral salpingo-oophorectomy, para-aortic lymphadenectomy, omentectomy and radical tumor debulking for a clear cell ovarian cancer, pT1c pN1, stage IIIC .  (8) adjuvant chemotherapy consisting of carboplatin and paclitaxel given weekly days 1 and 8 of each 21 day cycle, for 6-8 cycles as tolerated, started 07/17/2014, with onpro support  (a) day 8 cycle 2 omitted and further treatments cancelled because of an episode of Klebsiella sepsis requiring intensive care unit admission  (9) bilateral lower extremity DVTs documented 09/25/2014 despite being on Coumadin for PAF (subtherapeutic INR) with mobile Right common femoral clot  (a) status post thrombolysis 09/30/2014  (b) on Lovenox daily as of 10/02/2014  (c) IVC filter placed 10/26/2014 as mobile clot still noted Right groin  (d) IVC filter to be removed 01/30/2015 if repeat dopplers before that allow it  (10) started abraxane 10/13/2014, to be repeated every 14 days for 6 cycles (12 doses)  (11) genetics testing December 04, 2014 through the Breast[facility]Ovarian Cancer Panel [facility] no deleterious  mutations in ATM, BARD1, BRCA1, BRCA2, BRIP1, CDH1, CHEK2, FANCC, MLH1, MSH2, MSH6, NBN, PALB2, PMS2, PTEN, RAD51C, RAD51D, STK11, TP53, and XRCC2.    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   PLAN: Shariya  Is doing very well clinically. Labs reviewed and are stable for treatment. Recommend that she proceed with her Abraxane today as scheduled.   She is already scheduled to have a PET scan 03/26/2015 and will see Dr. Magrinat on 04/03/2015 to discuss results.  I encouraged Merriel to remain active, and in addition to housework take little walks if she feels up to it.Mckenna knows to call for any problems that may develop before her next visit here.   , , NP    03/26/2015   

## 2015-03-26 NOTE — Patient Instructions (Signed)
[facility] Discharge Instructions for Patients Receiving Chemotherapy  Today you received the following chemotherapy agents abraxane  To help prevent nausea and vomiting after your treatment, we encourage you to take your nausea medication    If you develop nausea and vomiting that is not controlled by your nausea medication, call the clinic.   BELOW ARE SYMPTOMS THAT SHOULD BE REPORTED IMMEDIATELY:  *FEVER GREATER THAN 100.5 F  *CHILLS WITH OR WITHOUT FEVER  NAUSEA AND VOMITING THAT IS NOT CONTROLLED WITH YOUR NAUSEA MEDICATION  *UNUSUAL SHORTNESS OF BREATH  *UNUSUAL BRUISING OR BLEEDING  TENDERNESS IN MOUTH AND THROAT WITH OR WITHOUT PRESENCE OF ULCERS  *URINARY PROBLEMS  *BOWEL PROBLEMS  UNUSUAL RASH Items with * indicate a potential emergency and should be followed up as soon as possible.  Feel free to call the clinic you have any questions or concerns. The clinic phone number is [phone].  Please show the CHEMO ALERT CARD at check-in to the Emergency Department and triage nurse.   

## 2015-03-30 ENCOUNTER — Encounter: Payer: Self-pay | Admitting: Family

## 2015-03-30 ENCOUNTER — Ambulatory Visit (INDEPENDENT_AMBULATORY_CARE_PROVIDER_SITE_OTHER): Payer: Medicare Other | Admitting: Family

## 2015-03-30 ENCOUNTER — Encounter (HOSPITAL_COMMUNITY)
Admission: RE | Admit: 2015-03-30 | Discharge: 2015-03-30 | Disposition: A | Discharge status: Home / Self Care | Payer: Medicare Other | Source: Ambulatory Visit | Attending: Oncology | Admitting: Oncology

## 2015-03-30 ENCOUNTER — Other Ambulatory Visit (HOSPITAL_BASED_OUTPATIENT_CLINIC_OR_DEPARTMENT_OTHER): Payer: Medicare Other

## 2015-03-30 VITALS — BP 100/50 | HR 80 | Temp 98.6°F | Resp 16 | Ht 64.0 in | Wt 166.5 lb

## 2015-03-30 DIAGNOSIS — E1121 Type 2 diabetes mellitus with diabetic nephropathy: Secondary | ICD-10-CM

## 2015-03-30 DIAGNOSIS — F329 Major depressive disorder, single episode, unspecified: Secondary | ICD-10-CM

## 2015-03-30 DIAGNOSIS — C50911 Malignant neoplasm of unspecified site of right female breast: Secondary | ICD-10-CM | POA: Diagnosis not present

## 2015-03-30 DIAGNOSIS — K760 Fatty (change of) liver, not elsewhere classified: Secondary | ICD-10-CM

## 2015-03-30 DIAGNOSIS — C569 Malignant neoplasm of unspecified ovary: Secondary | ICD-10-CM

## 2015-03-30 DIAGNOSIS — I4891 Unspecified atrial fibrillation: Secondary | ICD-10-CM

## 2015-03-30 DIAGNOSIS — E1110 Type 2 diabetes mellitus with ketoacidosis without coma: Secondary | ICD-10-CM

## 2015-03-30 DIAGNOSIS — E1310 Other specified diabetes mellitus with ketoacidosis without coma: Secondary | ICD-10-CM

## 2015-03-30 DIAGNOSIS — F32A Depression, unspecified: Secondary | ICD-10-CM

## 2015-03-30 LAB — CBC WITH DIFFERENTIAL/PLATELET
BASO%: 0.4 % (ref 0.0–2.0)
BASOS ABS: 0 10*3/uL (ref 0.0–0.1)
EOS%: 1.3 % (ref 0.0–7.0)
Eosinophils Absolute: 0.1 10*3/uL (ref 0.0–0.5)
HEMATOCRIT: 33.1 % — AB (ref 34.8–46.6)
HEMOGLOBIN: 10.7 g/dL — AB (ref 11.6–15.9)
LYMPH#: 0.5 10*3/uL — AB (ref 0.9–3.3)
LYMPH%: 7.6 % — ABNORMAL LOW (ref 14.0–49.7)
MCH: 27.3 pg (ref 25.1–34.0)
MCHC: 32.2 g/dL (ref 31.5–36.0)
MCV: 84.8 fL (ref 79.5–101.0)
MONO#: 0.1 10*3/uL (ref 0.1–0.9)
MONO%: 1.5 % (ref 0.0–14.0)
NEUT#: 6 10*3/uL (ref 1.5–6.5)
NEUT%: 89.2 % — AB (ref 38.4–76.8)
PLATELETS: 94 10*3/uL — AB (ref 145–400)
RBC: 3.91 10*6/uL (ref 3.70–5.45)
RDW: 23 % — AB (ref 11.2–14.5)
WBC: 6.7 10*3/uL (ref 3.9–10.3)

## 2015-03-30 LAB — COMPREHENSIVE METABOLIC PANEL (CC13)
ALBUMIN: 3.3 g/dL — AB (ref 3.5–5.0)
ALK PHOS: 156 U/L — AB (ref 40–150)
ALT: 15 U/L (ref 0–55)
ANION GAP: 8 meq/L (ref 3–11)
AST: 22 U/L (ref 5–34)
BILIRUBIN TOTAL: 1.08 mg/dL (ref 0.20–1.20)
BUN: 8.3 mg/dL (ref 7.0–26.0)
CALCIUM: 8.7 mg/dL (ref 8.4–10.4)
CHLORIDE: 107 meq/L (ref 98–109)
CO2: 24 mEq/L (ref 22–29)
CREATININE: 0.6 mg/dL (ref 0.6–1.1)
EGFR: >90 mL/min/{1.73_m2} (ref >90)
Glucose: 94 mg/dl (ref 70–140)
Potassium: 3.9 mEq/L (ref 3.5–5.1)
Sodium: 139 mEq/L (ref 136–145)
Total Protein: 6.2 g/dL — ABNORMAL LOW (ref 6.4–8.3)

## 2015-03-30 LAB — GLUCOSE, CAPILLARY: GLUCOSE-CAPILLARY: 89 mg/dL (ref 65–99)

## 2015-03-30 LAB — HEMOGLOBIN A1C: HEMOGLOBIN A1C: 5.4 % (ref 4.6–6.5)

## 2015-03-30 MED ORDER — FLUDEOXYGLUCOSE F - 18 (FDG) INJECTION
8.0900 | Freq: Once | INTRAVENOUS | Status: DC | PRN
  Administered 2015-03-30: 8.09 via INTRAVENOUS
  Filled 2015-03-30: qty 8.09

## 2015-03-30 NOTE — Patient Instructions (Signed)
Please complete lab work. Ask Dr. Jana Hakim if he wants you to take xanax and klonopin together- usually we use one or the other.

## 2015-03-30 NOTE — Progress Notes (Signed)
Subjective:    Patient ID: [name], female    DOB: 1955/07/18, 59 y.o.   MRN: [id number]  HPI  Ms. Rosasco is a 59 yr old female who presents today for follow up.  1) DM2- diabetes is diet controlled.   Lab Results  Component Value Date   HGBA1C 5.2 12/15/2014   HGBA1C 5.6 04/26/2014   HGBA1C 6.8* 01/24/2014   Lab Results  Component Value Date   MICROALBUR 5.8* 12/15/2014   LDLCALC 92 09/15/2014   CREATININE 0.6 03/26/2015   2) Depression- currently maintained on wellbutrin, lexapro, prn xanax. Went to [employer] for a wedding, enjoyed.  Mood is good.    3) HTN- current bp meds- metoprolol 18m, lasix 233m   BP Readings from Last 3 Encounters:  03/30/15 100/50  03/26/15 121/64  03/05/15 127/50   Review of Systems See HPI  Past Medical History  Diagnosis Date  . Depression   . DVT (deep venous thrombosis) (HCC)     hx of on HRT left leg ~2006  . GERD (gastroesophageal reflux disease)   . Hyperlipidemia   . Hypertension   . Hypothyroidism   . PPD positive, treated     rx inh   . Diabetes mellitus   . Liver disease, chronic, with cirrhosis ([facility]riend[facility]    ? autoimmune  . DJD (degenerative joint disease) of lumbar spine   . Chronic combined systolic and diastolic CH[place] (congestive heart failure) [place]    a. 08/2012 Echo: EF 55-60%, no rwma, mod MR;  b. 07/2014 Echo: EF 45-50%, distal antsept HK, mod TR/MR, mildly bil-atrial enlargement.  . Paroxysmal atrial fibrillation (HCC)     a. CHA2DS2VASc = 4-->coumadin.  . Marland Kitchenhysical deconditioning   . Allergy   . Anemia     low iron hx  . Cataract     removed ou  . OSA on CPAP     cpap 4.5 setting  . Breast cancer (HCReese2014    a. 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 Clear Cell Ovarian Cancer     a. 06/2014 s/p TAH/BSO debulking/lymph node dissection;  b. Now on chemo.    Social History   Social History    . Marital Status: Married    Spouse Name: N/A  . Number of Children: 2  . Years of Education: N/A   Occupational History  . [employer]  . Smoking status: Former Smoker -- 1.00 packs/day for 1 years  . Smokeless tobacco: Never Used  . Alcohol Use: No  . Drug Use: No     Comment: quit age [age]nly smoked as a teen 1 year  . Sexual Activity: No   Other Topics Concern  . Not on file   Social History Narrative   Married   Works at [employer]   Daily caffeine use - 2 cups a day plus a couple of sodas a day   Pt doesn't exercise regularly   G2P2   H H of 5 soon to be 6 .     Past Surgical History  Procedure Laterality Date  . Tubal ligation    . Cholecystectomy    . Foot surgery    . Eye surgery    . Cataract extraction    . Percutaneous liver biopsy    . Breast biopsy  left breast  . Mastectomy modified radical  06/08/2012    Procedure: MASTECTOMY MODIFIED RADICAL;  Surgeon: Edward Jolly, MD;  Location: [facility];  Service: General;  Laterality: Right;  . Portacath placement  06/08/2012    Procedure: INSERTION PORT-A-CATH;  Surgeon: Edward Jolly, MD;  Location: [facility];  Service: General;  Laterality: Left;  . Peg tube placement      peg removed 2015  . History of chemotherapy x 2 treatments, radiation tx  2014  . Pac removed  2015  . Laparotomy N/A 06/27/2014    Procedure: EXPLORATORY LAPAROTOMY;  Surgeon: Everitt Amber, MD;  Location: [facility];  Service: Gynecology;  Laterality: N/A;  . Abdominal hysterectomy N/A 06/27/2014    Procedure: HYSTERECTOMY ABDOMINAL TOTAL;  Surgeon: Everitt Amber, MD;  Location: [facility];  Service: Gynecology;  Laterality: N/A;  . Salpingoophorectomy Bilateral 06/27/2014    Procedure: BILATERAL SALPINGO OOPHORECTOMY/TUMOR DEBULKING/LYMPHNODE DISSECTION, OMENTECTOMY;  Surgeon: Everitt Amber, MD;  Location: [facility];  Service: Gynecology;  Laterality: Bilateral;    Family History  Problem  Relation Age of Onset  . Diabetes Mother   . Hypertension Mother   . Arthritis Mother   . Heart disease Mother   . Heart failure Mother   . Other Mother     benign breast mass  . Stroke Father   . Heart disease Father   . Dementia Father   . Diabetes Paternal Grandmother   . Colon cancer Paternal Grandfather     dx. 70s-80s  . Other Daughter     potentially has had negative BRCA testing  . Other Daughter     hysterectomy; potentially has had negative genetic testing    Allergies  Allergen Reactions  . Olmesartan Medoxomil Cough  . Adhesive [Tape] Rash  . Tetracycline Hcl Other (See Comments)  . Venlafaxine Other (See Comments)    severe dry mouth    Current Outpatient Prescriptions on File Prior to Visit  Medication Sig Dispense Refill  . acetaminophen (TYLENOL) 500 MG tablet Take 500 mg by mouth every 6 (six) hours as needed for moderate pain.    [name] ALPRAZolam (XANAX XR) 1 MG 24 hr tablet Take 1 tablet (1 mg total) by mouth 2 (two) times daily. 180 tablet 0  . buPROPion (WELLBUTRIN XL) 150 MG 24 hr tablet Take 1 tablet (150 mg total) by mouth daily. 90 tablet 1  . clonazePAM (KLONOPIN) 0.5 MG tablet Take 0.5 tablets (0.25 mg total) by mouth 2 (two) times daily. 30 tablet 0  . docusate sodium (COLACE) 100 MG capsule Take 1 capsule (100 mg total) by mouth 2 (two) times daily. 20 capsule 0  . enoxaparin (LOVENOX) 100 MG/ML injection Inject 1 mL (100 mg total) into the skin at bedtime. 90 Syringe 2  . escitalopram (LEXAPRO) 20 MG tablet Take 1 tablet (20 mg total) by mouth every morning. 90 tablet 1  . famotidine (PEPCID) 20 MG tablet Take 1 tablet (20 mg total) by mouth daily. (Patient taking differently: Take 20 mg by mouth daily as needed for heartburn or indigestion. ) 30 tablet 0  . furosemide (LASIX) 20 MG tablet Take 1 tablet (20 mg total) by mouth daily. 90 tablet 1  . letrozole (FEMARA) 2.5 MG tablet Take 1 tablet (2.5 mg total) by mouth daily. 90 tablet 4  .  levothyroxine (SYNTHROID, LEVOTHROID) 150 MCG tablet Take 1 tablet (150 mcg total) by mouth daily. 90 tablet 1  . lidocaine-prilocaine (EMLA) cream Apply over port area 1-2  hours before chemotherapy 30 g 0  . metoprolol (LOPRESSOR) 50 MG tablet Take 1 tablet (50 mg total) by mouth 2 (two) times daily. 180 tablet 1  . Nutritional Supplements (ENSURE HIGH PROTEIN PO) Take 237 mLs by mouth 2 (two) times daily.    [name] OLANZapine (ZYPREXA) 2.5 MG tablet Take 1 tablet (2.5 mg total) by mouth at bedtime. 90 tablet 1  . ondansetron (ZOFRAN) 8 MG tablet Take 1 tablet (8 mg total) by mouth 2 (two) times daily. Start the day after chemo for 2 days. Then take as needed for nausea or vomiting. 30 tablet 1  . potassium chloride (K-DUR) 10 MEQ tablet Take 1 tablet (10 mEq total) by mouth 2 (two) times daily. 180 tablet 1  . prochlorperazine (COMPAZINE) 10 MG tablet Take 1 tablet (10 mg total) by mouth every 6 (six) hours as needed (Nausea or vomiting). 30 tablet 1   No current facility-administered medications on file prior to visit.    BP 100/50 mmHg  Pulse 80  Temp(Src) 98.6 F (37 C) (Oral)  Resp 16  Ht _0  (1.626 m)  Wt 166 lb 8 oz (75.524 kg)  BMI 28.57 kg/m2  SpO2 100%       Objective:   Physical Exam  Constitutional: She is oriented to person, place, and time. She appears well-developed and well-nourished.  HENT:  Head: Normocephalic and atraumatic.  Cardiovascular: Normal rate, regular rhythm and normal heart sounds.   No murmur heard. Pulmonary/Chest: Effort normal and breath sounds normal. No respiratory distress. She has no wheezes.  Musculoskeletal: She exhibits no edema.  Neurological: She is alert and oriented to person, place, and time.  Psychiatric: She has a normal mood and affect. Her behavior is normal. Judgment and thought content normal.          Assessment & Plan:

## 2015-03-30 NOTE — Progress Notes (Signed)
Pre visit review using our clinic review tool, if applicable. No additional management support is needed unless otherwise documented below in the visit note. 

## 2015-03-30 NOTE — Assessment & Plan Note (Signed)
Sugars have been well controlled. Obtain bmet.

## 2015-03-30 NOTE — Assessment & Plan Note (Addendum)
Stable on current meds. Continue same. She is currently maintained on 2 different benzo's by oncology and I have asked her to speak with her oncologist to confirm that he wants her on both meds.

## 2015-03-31 ENCOUNTER — Other Ambulatory Visit: Payer: Self-pay | Admitting: Oncology

## 2015-03-31 LAB — CA 125: CA 125: 29 U/mL (ref <35)

## 2015-04-03 ENCOUNTER — Telehealth: Payer: Self-pay | Admitting: Oncology

## 2015-04-03 ENCOUNTER — Ambulatory Visit (HOSPITAL_BASED_OUTPATIENT_CLINIC_OR_DEPARTMENT_OTHER): Payer: Medicare Other | Admitting: Oncology

## 2015-04-03 VITALS — BP 117/73 | HR 85 | Temp 98.2°F | Resp 18 | Ht 64.0 in | Wt 170.2 lb

## 2015-04-03 DIAGNOSIS — K760 Fatty (change of) liver, not elsewhere classified: Secondary | ICD-10-CM

## 2015-04-03 DIAGNOSIS — G620 Drug-induced polyneuropathy: Secondary | ICD-10-CM

## 2015-04-03 DIAGNOSIS — C50911 Malignant neoplasm of unspecified site of right female breast: Secondary | ICD-10-CM

## 2015-04-03 DIAGNOSIS — C569 Malignant neoplasm of unspecified ovary: Secondary | ICD-10-CM

## 2015-04-03 DIAGNOSIS — Z86718 Personal history of other venous thrombosis and embolism: Secondary | ICD-10-CM

## 2015-04-03 NOTE — Telephone Encounter (Signed)
Appointments made and avs printed for patient °

## 2015-04-03 NOTE — Progress Notes (Signed)
ID: [name]   DOB: 12/07/1955  MR#: [id number]  NLZ#:[id number]  PCP: Rhonda Pear., NP GYN:  SU: [facility] OTHER [name]  CHIEF COMPLAINT:  Estrogen receptor positive Breast Cancer; Ovarian cancer; DVT  CURRENT TREATMENT: Letrozole; lovenox   BREAST CANCER HISTORY: From the original consult note:  [name] noted a mass in her right breast mid December 2013, and as it did not spontaneously resolve over a couple of weeks she brought it to her primary physician's attention. She was set up for diagnostic mammography and right breast ultrasonography at the breast Center 05/10/2012. (Note that the patient's most recent prior mammography had been in October 2008). The current study showed a spiculated mass in the superior subareolar portion of the right breast measuring approximately 5 cm and associated with pleomorphic calcifications. This was firm and palpable. There was right nipple retraction and skin thickening. Ultrasound confirmed an irregularly marginated hypoechoic mass measuring 3.5 cm by ultrasound, and an abnormal appearing lower right axillary lymph node measuring 2.6 cm.  Biopsies of both the breast mass and the abnormal appearing lymph node were performed 05/21/2012. Both showed an invasive ductal carcinoma, grade 2, with similar prognostic panels (the breast mass was 100% estrogen and 73% progesterone receptor positive, with an MIB-1 of 5%; the lymph node was 100% estrogen 100% progesterone receptor positive, with an MIB-1 of 20%). Both masses were HER-2 negative.  Breast MRI obtained at [facility] imaging 05/29/2012 confirmed a dominant mass in the retroareolar right breast measuring 4.4 cm maximally. There was a satellite nodule inferior and lateral to this mass, measuring 1.7 cm. There were no other masses in either breast. Aside from the previously biopsied lymph node there were other mildly enhancing level I  right axillary lymph nodes which did not appear pathologic. There was no other lymphadenopathy noted.   The patient's subsequent history is as detailed below.  OVARIAN CANCER HISTORY: From the 06/16/2014 summary note:  "[name] returns today for review of her restaging studies accompanied by her husband [name]. To summarize her recent history: She was admitted to [facility] with a diagnosis of possible pneumonia in late December 2015. Chest x-ray showed some increased density in the right upper lobe. CT scan was suggested and was obtained by [name]. This showed 2 small nodules in the right middle lobe, measuring 8 and 4 mm respectively. [name] then contacted [name] for further evaluation and we set [name] up for a PET scan and which was performed late January. This showed the 2 nodules in the lung not to be hypermetabolic. This of course does not prove that they are not malignant. More importantly, there was a 13.7 cm cystic/solid mass in the upper pelvis associated with adenopathy,. All of this was hypermetabolic."  The patient was evaluated by gynecologic oncology and on 06/27/2014 she underwent surgery under [name]. This consisted of an exploratory laparotomy with hysterectomy and abdominal salpingo-oophorectomy, as well as tumor debulking and omentectomy. 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 The tumor measured 11.5 cm, focally involve the ovarian capsule on the left; the uterus and right ovary and fallopian tube were unremarkable. One para-aortic lymph node was positive out of a total of 6 lymph nodes sampled (2 left common iliac, 2 left para-aortic, and to within the omental resection).  Her subsequent history is as detailed below   INTERVAL HISTORY: [name]  returns today for follow-up of her clear cell ovarian carcinoma and breast cancer, accompanied by her husband [name]. She  completed her sixth cycle (12th dose) of Abraxane on 03/26/2015 (today is day 9) with good tolerance. Before that dose she took a long trip with [name] to a wedding in [place] going all the way to [place] and then returning by way of [place]. She "really enjoyed that trip".  REVIEW OF SYSTEMS: Anayi tolerated the Abraxane remarkably well. She has some persistent numbness in the fifth digit of the left hand and a little bit on the tip of the fourth digit of that hand as well. This may not be actually related to the treatment of course. She feels anxious and depressed but much less so than before. Her appetite has normalized as has her sense of taste. She has normal bowel movements and no problems with bladder control. A detailed review of systems today was stable  PAST MEDICAL HISTORY: Past Medical History  Diagnosis Date  . Depression   . DVT (deep venous thrombosis) (HCC)     hx of on HRT left leg ~2006  . GERD (gastroesophageal reflux disease)   . Hyperlipidemia   . Hypertension   . Hypothyroidism   . PPD positive, treated     rx inh   . Diabetes mellitus   . Liver disease, chronic, with cirrhosis ([place])     ? autoimmune  . DJD (degenerative joint disease) of lumbar spine   . Chronic combined systolic and diastolic CHF (congestive heart failure) ([place])     a. 08/2012 Echo: EF 55-60%, no rwma, mod MR;  b. 07/2014 Echo: EF 45-50%, distal antsept HK, mod TR/MR, mildly bil-atrial enlargement.  . Paroxysmal atrial fibrillation (HCC)     a. CHA2DS2VASc = 4-->coumadin.  [name] Physical deconditioning   . Allergy   . Anemia     low iron hx  . Cataract     removed ou  . OSA on CPAP     cpap 4.5 setting  . Breast cancer ([place]) 2014    a. 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 Clear Cell Ovarian Cancer     a. 06/2014 s/p TAH/BSO debulking/lymph node dissection;  b. Now on chemo.    PAST  SURGICAL HISTORY: Past Surgical History  Procedure Laterality Date  . Tubal ligation    . Cholecystectomy    . Foot surgery    . Eye surgery    . Cataract extraction    . Percutaneous liver biopsy    . Breast biopsy      left breast  . Mastectomy modified radical  06/08/2012    Procedure: MASTECTOMY MODIFIED RADICAL;  Surgeon: Edward Jolly, MD;  Location: [place];  Service: General;  Laterality: Right;  . Portacath placement  06/08/2012    Procedure: INSERTION PORT-A-CATH;  Surgeon: Edward Jolly, MD;  Location: [facility];  Service: General;  Laterality: Left;  . Peg tube placement      peg removed 2015  . History of chemotherapy x 2 treatments, radiation tx  2014  . Pac removed  2015  . Laparotomy N/A 06/27/2014    Procedure: EXPLORATORY LAPAROTOMY;  Surgeon: Everitt Amber, MD;  Location: [facility];  Service: Gynecology;  Laterality: N/A;  . Abdominal hysterectomy N/A 06/27/2014    Procedure: HYSTERECTOMY ABDOMINAL TOTAL;  Surgeon: Everitt Amber, MD;  Location: [facility];  Service: Gynecology;  Laterality: N/A;  . Salpingoophorectomy Bilateral 06/27/2014    Procedure: BILATERAL SALPINGO OOPHORECTOMY/TUMOR DEBULKING/LYMPHNODE DISSECTION, OMENTECTOMY;  Surgeon: Everitt Amber, MD;  Location: [facility];  Service: Gynecology;  Laterality: Bilateral;    FAMILY HISTORY Family History  Problem Relation Age of Onset  . Diabetes Mother   . Hypertension Mother   . Arthritis Mother   . Heart disease Mother   . Heart failure Mother   . Other Mother     benign breast mass  . Stroke Father   . Heart disease Father   . Dementia Father   . Diabetes Paternal Grandmother   . Colon cancer Paternal Grandfather     dx. 70s-80s  . Other Daughter     potentially has had negative BRCA testing  . Other Daughter     hysterectomy; potentially has had negative genetic testing   the patient's father died in his 38s with a history of dementia. He had had prior strokes. The patient's mother died in her 53s, with a  history of congestive heart failure. Jenniefer had no brothers, one sister. There is no history of breast or ovarian cancer in the family.  GYNECOLOGIC HISTORY: Menarche age 80, first live birth age 70, she is GX P2, menopause approximately 15 years ago, on hormone replacement until 2010.  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 Her husband [name] works for [employer]. Daughter Paul Half is a Physiological scientist and lives in [place]. Daughter Santiago Bur and her family (husband and 2 children aged 29 and 3 years) currently live with the patient. Kaelen is a member of a [employer].   ADVANCED DIRECTIVES: Not in place  HEALTH MAINTENANCE: (Updated October 2014) Social History  Substance Use Topics  . Smoking status: Former Smoker -- 1.00 packs/day for 1 years  . Smokeless tobacco: Never Used  . Alcohol Use: No     Colonoscopy: Never  PAP: Does not recall  Bone density: Never  Lipid panel:   Allergies  Allergen Reactions  . Olmesartan Medoxomil Cough  . Adhesive [Tape] Rash  . Tetracycline Hcl Other (See Comments)  . Venlafaxine Other (See Comments)    severe dry mouth    Current Outpatient Prescriptions  Medication Sig Dispense Refill  . acetaminophen (TYLENOL) 500 MG tablet Take 500 mg by mouth every 6 (six) hours as needed for moderate pain.    [name] ALPRAZolam (XANAX XR) 1 MG 24 hr tablet Take 1 tablet (1 mg total) by mouth 2 (two) times daily. 180 tablet 0  . buPROPion (WELLBUTRIN XL) 150 MG 24 hr tablet Take 1 tablet (150 mg total) by mouth daily. 90 tablet 1  . clonazePAM (KLONOPIN) 0.5 MG tablet Take 0.5 tablets (0.25 mg total) by mouth 2 (two) times daily. 30 tablet 0  . docusate sodium (COLACE) 100 MG capsule Take 1 capsule (100 mg total) by mouth 2 (two) times daily. 20 capsule 0  . enoxaparin (LOVENOX) 100 MG/ML injection Inject 1 mL (100 mg total)  into the skin at bedtime. 90 Syringe 2  . escitalopram (LEXAPRO) 20 MG tablet Take 1 tablet (20 mg total) by mouth every morning. 90 tablet 1  . famotidine (PEPCID) 20 MG tablet Take 1 tablet (20 mg total) by mouth daily. (Patient taking differently: Take 20 mg by mouth daily as needed for heartburn or indigestion. ) 30 tablet 0  . furosemide (LASIX) 20 MG tablet Take  1 tablet (20 mg total) by mouth daily. 90 tablet 1  . letrozole (FEMARA) 2.5 MG tablet Take 1 tablet (2.5 mg total) by mouth daily. 90 tablet 4  . levothyroxine (SYNTHROID, LEVOTHROID) 150 MCG tablet Take 1 tablet (150 mcg total) by mouth daily. 90 tablet 1  . lidocaine-prilocaine (EMLA) cream Apply over port area 1-2 hours before chemotherapy 30 g 0  . metoprolol (LOPRESSOR) 50 MG tablet Take 1 tablet (50 mg total) by mouth 2 (two) times daily. 180 tablet 1  . Nutritional Supplements (ENSURE HIGH PROTEIN PO) Take 237 mLs by mouth 2 (two) times daily.    [name] OLANZapine (ZYPREXA) 2.5 MG tablet Take 1 tablet (2.5 mg total) by mouth at bedtime. 90 tablet 1  . ondansetron (ZOFRAN) 8 MG tablet Take 1 tablet (8 mg total) by mouth 2 (two) times daily. Start the day after chemo for 2 days. Then take as needed for nausea or vomiting. 30 tablet 1  . potassium chloride (K-DUR) 10 MEQ tablet Take 1 tablet (10 mEq total) by mouth 2 (two) times daily. 180 tablet 1  . prochlorperazine (COMPAZINE) 10 MG tablet Take 1 tablet (10 mg total) by mouth every 6 (six) hours as needed (Nausea or vomiting). 30 tablet 1   No current facility-administered medications for this visit.   Facility-Administered Medications Ordered in Other Visits  Medication Dose Route Frequency Provider Last Rate Last Dose  . fludeoxyglucose F - 18 (FDG) injection 8.09 milli Curie  8.09 milli Curie Intravenous Once PRN Chauncey Cruel, MD   8.09 milli Curie at 03/30/15 1032    OBJECTIVE: Middle-aged white woman who appears stated age 10 Vitals:   04/03/15 1458  BP: 117/73   Pulse: 85  Temp: 98.2 F (36.8 C)  Resp: 18     Body mass index is 29.2 kg/(m^2).    ECOG FS: 0 Filed Weights   04/03/15 1458  Weight: 170 lb 3.2 oz (77.202 kg)   Sclerae unicteric, pupils round and equal Oropharynx clear and moist-- no thrush or other lesions No cervical or supraclavicular adenopathy Lungs no rales or rhonchi Heart regular rate and rhythm Abd soft, nontender, positive bowel sounds, no masses palpated MSK no focal spinal tenderness, no upper extremity lymphedema Neuro: nonfocal, well oriented, appropriate affect Breasts: Deferred    Lab Results  Component Value Date   WBC 6.7 03/30/2015   NEUTROABS 6.0 03/30/2015   HGB 10.7* 03/30/2015   HCT 33.1* 03/30/2015   MCV 84.8 03/30/2015   PLT 94* 03/30/2015  Results for TIPPI, MCCRAE (MRN [id number]) as of 04/03/2015 17:34  Ref. Range 12/25/2014 09:55 01/08/2015 09:05 02/05/2015 08:03 03/05/2015 08:08 03/30/2015 09:50  CA 125 Latest Ref Range: <35 U/mL 88 (H) 103 (H) 77 (H) 70 (H) 29      Chemistry      Component Value Date/Time   NA 139 03/30/2015 0950   NA 137 02/21/2015 0955   K 3.9 03/30/2015 0950   K 4.0 02/21/2015 0955   CL 112* 02/21/2015 0955   CL 104 10/29/2012 1058   CO2 24 03/30/2015 0950   CO2 19* 02/21/2015 0955   BUN 8.3 03/30/2015 0950   BUN 10 02/21/2015 0955   CREATININE 0.6 03/30/2015 0950   CREATININE 0.45 02/21/2015 0955   CREATININE 0.69 11/10/2013 0828      Component Value Date/Time   CALCIUM 8.7 03/30/2015 0950   CALCIUM 8.2* 02/21/2015 0955   ALKPHOS 156* 03/30/2015 0950   ALKPHOS 152* 08/18/2014 1320  AST 22 03/30/2015 0950   AST 40* 08/18/2014 1320   ALT 15 03/30/2015 0950   ALT 20 08/18/2014 1320   BILITOT 1.08 03/30/2015 0950   BILITOT 0.8 08/18/2014 1320     STUDIES: Nm Pet Image Restag (ps) Skull Base To Thigh  03/30/2015  CLINICAL DATA:  Subsequent treatment strategy for breast cancer. EXAM: NUCLEAR MEDICINE PET SKULL BASE TO THIGH TECHNIQUE: 8.09 mCi F-18  FDG was injected intravenously. Full-ring PET imaging was performed from the skull base to thigh after the radiotracer. CT data was obtained and used for attenuation correction and anatomic localization. FASTING BLOOD GLUCOSE:  Value: 89 mg/dl COMPARISON:  06/05/2014 FINDINGS: NECK No hypermetabolic lymph nodes in the neck. CHEST Status post right mastectomy. 4 mm right middle lobe lung nodule is unchanged from previous exam, image 36 of series 6. Stable 3 mm right upper lobe lung nodule, image 14 of series 6. Also stable is a 4 mm right lung nodule, image 37 of series 6. No hypermetabolic mediastinal or hilar nodes. No suspicious pulmonary nodules on the CT scan. ABDOMEN/PELVIS No abnormal hypermetabolic activity within the liver, pancreas, adrenal glands, or spleen. The liver appears cirrhotic. The spleen measures 16 cm in length. Interval resection of large cystic mass within the pelvis. The previous index left paratracheal lymph node measures 9 mm, image 131 of series 4. SUV max associated with this node is equal to 1.3. Previously this lymph node measured 1.7 cm and had an SUV max equal 8.36. Resolution of previous hypermetabolic left pelvic side wall lymph nodes. No free fluid or fluid collections within the abdomen or pelvis. No peritoneal nodule or mass noted. SKELETON There is diffuse radiotracer uptake throughout the axial and appendicular skeleton. On the corresponding CT images no aggressive lytic or sclerotic bone lesion identified. Uptake is favored to represent treatment related changes. IMPRESSION: 1. Interval response to therapy. 2. Surgical resection of large complex cystic mass within the pelvis. The enlarged and hypermetabolic retroperitoneal and left pelvic sidewall lymph nodes have resolved in the interval. 3. Stable pulmonary nodules. 4. Diffuse increased uptake throughout the bone marrow likely reflecting treatment related changes. 5. Cirrhosis and splenomegaly. Electronically Signed   By:  Kerby Moors M.D.   On: 03/30/2015 12:11    ASSESSMENT: 59 y.o.  BRCA negative [name]   (1)  status post right mastectomy  06/08/2012 for a pT3, pN1a, stage IIIA invasive ductal carcinoma, grade 2,  estrogen and progesterone receptor positive, HER-2/neu negative, with an MIB-1 of 20%.    (2)  treated in the adjuvant setting with docetaxel/ cyclophosphamide given every 3 weeks. There were multiple and severe complications, and the  patient tolerated only two cycles, last dose 07/29/2012  (3) letrozole started 08/16/2012  (a) osteopenia, with a T score of -1.14 April 2013  (4) postmastectomy radiation completed 12/24/2012  (5)  comorbidities include diabetes, hypertension, chronic liver disease with cirrhosis and fatty liver, and nutritional disturbance with temporarily dependence on PEG feeds (discontinued July 2014).  (6) restaging studies January 2016 show  (a) 8 mm and 4 mm Right middle lobe lung nodules, not metabolically active on PET -- Rhonda require follow-up  (b) hypermetabolic 97.3 cm mixed solid/cystic lesion in upper pelvis, with associated adenopathy  (7) status post exploratory laparotomy 06/27/2014 with total abdominal hysterectomy, bilateral salpingo-oophorectomy, para-aortic lymphadenectomy, omentectomy and radical tumor debulking for a clear cell ovarian cancer, pT1c pN1, stage IIIC .  (8) adjuvant chemotherapy consisting of carboplatin and paclitaxel given weekly days 1  and 8 of each 21 day cycle, for 6-8 cycles as tolerated, started 07/17/2014, with onpro support  (a) day 8 cycle 2 omitted and further treatments cancelled because of an episode of Klebsiella sepsis requiring intensive care unit admission  (9) bilateral lower extremity DVTs documented 09/25/2014 despite being on Coumadin for PAF (subtherapeutic INR) with mobile Right common femoral clot  (a) status post thrombolysis 09/30/2014  (b) on Lovenox daily as of 10/02/2014  (c) IVC filter placed  10/26/2014 as mobile clot still noted Right groin  (d) IVC filter removed 01/30/2015 with documented resolution of the movable thrombus 01/16/2015  (10) started abraxane 10/13/2014, repeated every 14 days for 6 cycles (12 doses), completed 03/26/2015  (11) genetics testing December 04, 2014 through the Breast[facility]Ovarian Cancer Panel [facility] no deleterious mutations in ATM, BARD1, BRCA1, BRCA2, BRIP1, CDH1, CHEK2, FANCC, MLH1, MSH2, MSH6, NBN, PALB2, PMS2, PTEN, RAD51C, RAD51D, STK11, TP53, and XRCC2.    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   PLAN: Shemeika has completed the planned adjuvant therapy and currently has no evidence of active disease: Her CA 125 has normalized and her PET scan is essentially negative.  We are accordingly stopping the Abraxane at this point. She Rhonda continue on the letrozole. We are keeping the port and we Rhonda be flushing it every 6 weeks. We Rhonda obtain labwork every 6 weeks as well, including is CA 125.  She Rhonda return to see me in about 3 months. If all continues well, probably I Rhonda repeat staging studies 6 months from now. Given how poorly she tolerated the carboplatin/paclitaxel treatments we are going to be hard up to find tolerable therapy at the time of recurrence, but carboplatin alone [place] returning to Abraxane, or moving in the direction of Gemzar or Doxil are all possibilities.  We Rhonda consider discontinuing Lovenox at the February visit.  Nickayla knows to call for any problems that may develop before her next appointment here.   Chauncey Cruel, MD    04/03/2015

## 2015-04-17 ENCOUNTER — Other Ambulatory Visit: Payer: Self-pay | Admitting: Oncology

## 2015-04-24 ENCOUNTER — Ambulatory Visit (INDEPENDENT_AMBULATORY_CARE_PROVIDER_SITE_OTHER): Payer: Medicare Other

## 2015-04-24 DIAGNOSIS — Z23 Encounter for immunization: Secondary | ICD-10-CM

## 2015-04-27 ENCOUNTER — Telehealth: Payer: Self-pay | Admitting: Family

## 2015-04-27 ENCOUNTER — Telehealth: Payer: Self-pay

## 2015-04-27 NOTE — Telephone Encounter (Signed)
Pt would like to know she need to have her Prevnar vac also due to her having cancer once for extra protection to prevent her from getting sick?   Please advise, if so I will be glad to schedule.

## 2015-04-27 NOTE — Telephone Encounter (Signed)
Please advise Prevnar as pt is 78?

## 2015-04-27 NOTE — Telephone Encounter (Signed)
She needs to wait 12 months from time of pneumovax and insurance unlikely to cover until she is 7. She could have after 05/17/15 if she is willing to potentially pay out of pocket.  She can check with her insurance.

## 2015-04-27 NOTE — Telephone Encounter (Signed)
[facility] to get [facility]. Now it needs to go to [facility] in [place]. She is out and uses 2 /day.

## 2015-04-27 NOTE — Telephone Encounter (Signed)
Sent message to inhouse billing to check self pay cost for Prevnar 13.

## 2015-04-30 ENCOUNTER — Telehealth: Payer: Self-pay | Admitting: *Deleted

## 2015-04-30 DIAGNOSIS — C569 Malignant neoplasm of unspecified ovary: Secondary | ICD-10-CM

## 2015-04-30 MED ORDER — CLONAZEPAM 0.5 MG PO TABS: 0.5000 mg | ORAL_TABLET | Freq: Two times a day (BID) | ORAL | Status: DC

## 2015-04-30 NOTE — Telephone Encounter (Signed)
"  I called last week for refill.  My wife is out of medictation.  Confirmed medication name and [facility].  Did not receive the 04-17-2015 order."

## 2015-04-30 NOTE — Telephone Encounter (Signed)
Notified pt's spouse and he voices understanding. Out of pocket cost would be $225 per billing. Pt will let us know what she decides.

## 2015-05-01 ENCOUNTER — Telehealth: Payer: Self-pay

## 2015-05-01 NOTE — Telephone Encounter (Signed)
Returning husbands call. He was asking for alprazolam refill. We refilled clonazepam. They were close on clonazepam as well so that was taken care of. [facility] called [facility]. The insurance company is requiring a PA because we ordered extended release rather than immediate release. [facility] fax[facility] a PA form to [facility] today [phone].

## 2015-05-04 NOTE — Progress Notes (Signed)
Sent in prior authorization for alprazolam XR today.

## 2015-05-08 ENCOUNTER — Other Ambulatory Visit: Payer: Medicare Other

## 2015-05-08 ENCOUNTER — Ambulatory Visit (HOSPITAL_BASED_OUTPATIENT_CLINIC_OR_DEPARTMENT_OTHER): Payer: Medicare Other

## 2015-05-08 ENCOUNTER — Telehealth: Payer: Self-pay | Admitting: *Deleted

## 2015-05-08 VITALS — BP 129/59 | HR 78 | Temp 98.0°F | Resp 16

## 2015-05-08 DIAGNOSIS — Z452 Encounter for adjustment and management of vascular access device: Secondary | ICD-10-CM

## 2015-05-08 DIAGNOSIS — K760 Fatty (change of) liver, not elsewhere classified: Secondary | ICD-10-CM

## 2015-05-08 DIAGNOSIS — I4891 Unspecified atrial fibrillation: Secondary | ICD-10-CM

## 2015-05-08 DIAGNOSIS — C50911 Malignant neoplasm of unspecified site of right female breast: Secondary | ICD-10-CM | POA: Diagnosis not present

## 2015-05-08 DIAGNOSIS — C569 Malignant neoplasm of unspecified ovary: Secondary | ICD-10-CM

## 2015-05-08 DIAGNOSIS — Z95828 Presence of other vascular implants and grafts: Secondary | ICD-10-CM

## 2015-05-08 MED ORDER — HEPARIN SOD (PORK) LOCK FLUSH 100 UNIT/ML IV SOLN
500.0000 [IU] | Freq: Once | INTRAVENOUS | Status: AC
  Administered 2015-05-08: 500 [IU] via INTRAVENOUS
  Filled 2015-05-08: qty 5

## 2015-05-08 MED ORDER — SODIUM CHLORIDE 0.9 % IJ SOLN
10.0000 mL | INTRAMUSCULAR | Status: DC | PRN
  Administered 2015-05-08: 10 mL via INTRAVENOUS
  Filled 2015-05-08: qty 10

## 2015-05-08 NOTE — Telephone Encounter (Signed)
This RN contacted [facility] per husband stating he has been unable to obtain refill on pt's xanax XR.  Note pt's insurance has recently changed and pt is now obtaining refills at [facility].  This RN contacted [facility] per above and was informed request for prior authorization was sent to the managed care department of this office.  This RN will follow up with this office.  Of note pt uses the xanax XR as a scheduled antianxiety drug with good benefit including decreased anxiety and increased appetite, overall better sleep pattern and ability to engage in daily activities more productively.  Xanax immediate release causes increased somnolence and does not provide good coverage for pt's symptoms from anxiety. It should only be used as needed when pt has an acute anxiety attack.

## 2015-05-08 NOTE — Patient Instructions (Signed)

## 2015-05-09 LAB — CA 125: CA 125: 24 U/mL (ref <35)

## 2015-05-10 ENCOUNTER — Other Ambulatory Visit: Payer: Self-pay | Admitting: *Deleted

## 2015-05-10 ENCOUNTER — Encounter: Payer: Self-pay | Admitting: Oncology

## 2015-05-10 NOTE — Progress Notes (Signed)
Faxed to [facility] prior auth req for xanax.

## 2015-05-10 NOTE — Progress Notes (Unsigned)
Faxed to [employer] for prior auth for xanax to [facility]

## 2015-05-10 NOTE — Telephone Encounter (Signed)
Call received today from Mr. Mangieri out of alprazolam.  Voicemail sent to [phone].  Patient's spouse expressed sense of urgency since she has run out.

## 2015-05-11 ENCOUNTER — Encounter: Payer: Self-pay | Admitting: Oncology

## 2015-05-11 NOTE — Progress Notes (Signed)
Per [employer] pa [id number] approved for  Alrazolam. If pharmacy have issued they can contact [phone]. I sent to medical records and let nurse know.

## 2015-05-16 ENCOUNTER — Other Ambulatory Visit: Payer: Self-pay

## 2015-05-16 DIAGNOSIS — Z1231 Encounter for screening mammogram for malignant neoplasm of breast: Secondary | ICD-10-CM

## 2015-05-23 ENCOUNTER — Other Ambulatory Visit: Payer: Self-pay | Admitting: *Deleted

## 2015-05-30 ENCOUNTER — Other Ambulatory Visit: Payer: Self-pay | Admitting: Family

## 2015-05-30 NOTE — Telephone Encounter (Signed)
Omeprazole not on current med list.  Please advise if ok to refill?

## 2015-06-06 ENCOUNTER — Ambulatory Visit
Admission: RE | Admit: 2015-06-06 | Discharge: 2015-06-06 | Disposition: A | Discharge status: Home / Self Care | Payer: Medicare Other | Source: Ambulatory Visit

## 2015-06-06 DIAGNOSIS — Z1231 Encounter for screening mammogram for malignant neoplasm of breast: Secondary | ICD-10-CM

## 2015-06-25 ENCOUNTER — Telehealth: Payer: Self-pay | Admitting: Hematology and Oncology

## 2015-06-25 ENCOUNTER — Ambulatory Visit (HOSPITAL_BASED_OUTPATIENT_CLINIC_OR_DEPARTMENT_OTHER): Payer: Medicare Other

## 2015-06-25 ENCOUNTER — Other Ambulatory Visit: Payer: Medicare Other

## 2015-06-25 ENCOUNTER — Ambulatory Visit (HOSPITAL_BASED_OUTPATIENT_CLINIC_OR_DEPARTMENT_OTHER): Payer: Medicare Other | Admitting: Oncology

## 2015-06-25 VITALS — BP 149/61 | HR 76 | Temp 97.4°F | Resp 18 | Ht 64.0 in | Wt 197.3 lb

## 2015-06-25 DIAGNOSIS — C569 Malignant neoplasm of unspecified ovary: Secondary | ICD-10-CM

## 2015-06-25 DIAGNOSIS — C50911 Malignant neoplasm of unspecified site of right female breast: Secondary | ICD-10-CM

## 2015-06-25 DIAGNOSIS — G620 Drug-induced polyneuropathy: Secondary | ICD-10-CM

## 2015-06-25 DIAGNOSIS — Z95828 Presence of other vascular implants and grafts: Secondary | ICD-10-CM

## 2015-06-25 DIAGNOSIS — Z86718 Personal history of other venous thrombosis and embolism: Secondary | ICD-10-CM

## 2015-06-25 DIAGNOSIS — L719 Rosacea, unspecified: Secondary | ICD-10-CM | POA: Diagnosis not present

## 2015-06-25 DIAGNOSIS — I4891 Unspecified atrial fibrillation: Secondary | ICD-10-CM

## 2015-06-25 DIAGNOSIS — K760 Fatty (change of) liver, not elsewhere classified: Secondary | ICD-10-CM

## 2015-06-25 DIAGNOSIS — Z79811 Long term (current) use of aromatase inhibitors: Secondary | ICD-10-CM

## 2015-06-25 MED ORDER — LIDOCAINE-PRILOCAINE 2.5-2.5 % EX CREA: TOPICAL_CREAM | CUTANEOUS | Status: DC

## 2015-06-25 MED ORDER — HEPARIN SOD (PORK) LOCK FLUSH 100 UNIT/ML IV SOLN
500.0000 [IU] | Freq: Once | INTRAVENOUS | Status: AC
  Administered 2015-06-25: 500 [IU] via INTRAVENOUS
  Filled 2015-06-25: qty 5

## 2015-06-25 MED ORDER — LIDOCAINE-PRILOCAINE 2.5-2.5 % EX CREA
TOPICAL_CREAM | CUTANEOUS | Status: AC
  Filled 2015-06-25: qty 5

## 2015-06-25 MED ORDER — SODIUM CHLORIDE 0.9% FLUSH
10.0000 mL | INTRAVENOUS | Status: DC | PRN
  Administered 2015-06-25: 10 mL via INTRAVENOUS
  Filled 2015-06-25: qty 10

## 2015-06-25 MED ORDER — METRONIDAZOLE 1 % EX GEL: Freq: Every day | CUTANEOUS | Status: DC

## 2015-06-25 NOTE — Progress Notes (Signed)
ID: [name]   DOB: 05/07/1956  MR#: [id number]  JQG#:[id number]  PCP: [name], NP GYN:  SU: [facility] OTHER RF:XJOITG [name], Shanon Ace, Roney Jaffe  CHIEF COMPLAINT:  Estrogen receptor positive Breast Cancer; Ovarian cancer; DVT  CURRENT TREATMENT: Letrozole; lovenox   BREAST CANCER HISTORY: From the original consult note:  [name] noted a mass in her right breast mid December 2013, and as it did not spontaneously resolve over a couple of weeks she brought it to her primary physician's attention. She was set up for diagnostic mammography and right breast ultrasonography at the breast Center 05/10/2012. (Note that the patient's most recent prior mammography had been in October 2008). The current study showed a spiculated mass in the superior subareolar portion of the right breast measuring approximately 5 cm and associated with pleomorphic calcifications. This was firm and palpable. There was right nipple retraction and skin thickening. Ultrasound confirmed an irregularly marginated hypoechoic mass measuring 3.5 cm by ultrasound, and an abnormal appearing lower right axillary lymph node measuring 2.6 cm.  Biopsies of both the breast mass and the abnormal appearing lymph node were performed 05/21/2012. Both showed an invasive ductal carcinoma, grade 2, with similar prognostic panels (the breast mass was 100% estrogen and 73% progesterone receptor positive, with an MIB-1 of 5%; the lymph node was 100% estrogen 100% progesterone receptor positive, with an MIB-1 of 20%). Both masses were HER-2 negative.  Breast MRI obtained at [facility] imaging 05/29/2012 confirmed a dominant mass in the retroareolar right breast measuring 4.4 cm maximally. There was a satellite nodule inferior and lateral to this mass, measuring 1.7 cm. There were no other masses in either breast. Aside from the previously biopsied lymph node there were other mildly enhancing level I  right axillary lymph nodes which did not appear pathologic. There was no other lymphadenopathy noted.   The patient's subsequent history is as detailed below.  OVARIAN CANCER HISTORY: From the 06/16/2014 summary note:  "[name] returns today for review of her restaging studies accompanied by her husband [name]. To summarize her recent history: She was admitted to [facility] with a diagnosis of possible pneumonia in late December 2015. Chest x-ray showed some increased density in the right upper lobe. CT scan was suggested and was obtained by Dr. Inda Castle. This showed 2 small nodules in the right middle lobe, measuring 8 and 4 mm respectively. Dr. Inda Castle then contacted [name] for further evaluation and we set [name] up for a PET scan and which was performed late January. This showed the 2 nodules in the lung not to be hypermetabolic. This of course does not prove that they are not malignant. More importantly, there was a 13.7 cm cystic/solid mass in the upper pelvis associated with adenopathy,. All of this was hypermetabolic."  The patient was evaluated by gynecologic oncology and on 06/27/2014 she underwent surgery under Dr. Denman George. This consisted of an exploratory laparotomy with hysterectomy and abdominal salpingo-oophorectomy, as well as tumor debulking and omentectomy. 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 The tumor measured 11.5 cm, focally involve the ovarian capsule on the left; the uterus and right ovary and fallopian tube were unremarkable. One para-aortic lymph node was positive out of a total of 6 lymph nodes sampled (2 left common iliac, 2 left para-aortic, and to within the omental resection).  Her subsequent history is as detailed below   INTERVAL HISTORY: Susa  returns today for follow-up of her ovarian carcinoma and estrogen receptor positive breast cancer, accompanied by her husband  [name]. She continues on letrozole, which she tolerates well. Hot flashes are not a major problem. She is also Lovenox for her bilateral DVT history. She has had no bleeding complications. She does have some bruising over her belly where she receives the shots.  REVIEW OF SYSTEMS: [name] reports no symptoms suggestive of disease recurrence or progression. Sometimes she has a little bit of blurred vision. She feels weak, but she is walking in stores maybe 20 minutes at a time. She cannot do "the full [employer]" yet. She is a little anxious and depressed. On the plus side her children moved out of her home so she and [name] have more quiet. They really enjoyed this past weekend. 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 He one in the fourth digit is "just about gone" one in the left digit is minimal. A detailed review of systems today was otherwise stable  PAST MEDICAL HISTORY: Past Medical History  Diagnosis Date  . Depression   . DVT (deep venous thrombosis) (HCC)     hx of on HRT left leg ~2006  . GERD (gastroesophageal reflux disease)   . Hyperlipidemia   . Hypertension   . Hypothyroidism   . PPD positive, treated     rx inh   . Diabetes mellitus   . Liver disease, chronic, with cirrhosis ([place])     ? autoimmune  . DJD (degenerative joint disease) of lumbar spine   . Chronic combined systolic and diastolic CHF (congestive heart failure) ([place])     a. 08/2012 Echo: EF 55-60%, no rwma, mod MR;  b. 07/2014 Echo: EF 45-50%, distal antsept HK, mod TR/MR, mildly bil-atrial enlargement.  . Paroxysmal atrial fibrillation (HCC)     a. CHA2DS2VASc = 4-->coumadin.  [name] Physical deconditioning   . Allergy   . Anemia     low iron hx  . Cataract     removed ou  . OSA on CPAP     cpap 4.5 setting  . Breast cancer ([place]) 2014    a. Right - invasive ductal carcinoma with 2/18 lymph nodes involved (pT3, pN1a, stage IIIA), s/p R mastectomy 06/08/12, chemo (not well  tolerated->d/c)  . Clear Cell Ovarian Cancer     a. 06/2014 s/p TAH/BSO debulking/lymph node dissection;  b. Now on chemo.    PAST SURGICAL HISTORY: Past Surgical History  Procedure Laterality Date  . Tubal ligation    . Cholecystectomy    . Foot surgery    . Eye surgery    . Cataract extraction    . Percutaneous liver biopsy    . Breast biopsy      left breast  . Mastectomy modified radical  06/08/2012    Procedure: MASTECTOMY MODIFIED RADICAL;  Surgeon: Edward Jolly, MD;  Location: [facility];  Service: General;  Laterality: Right;  . Portacath placement  06/08/2012    Procedure: INSERTION PORT-A-CATH;  Surgeon: Edward Jolly, MD;  Location: [place];  Service: General;  Laterality: Left;  . Peg tube placement      peg removed 2015  . History of chemotherapy x 2 treatments, radiation tx  2014  . Pac removed  2015  . Laparotomy N/A 06/27/2014    Procedure: EXPLORATORY LAPAROTOMY;  Surgeon: Everitt Amber, MD;  Location: [facility];  Service: Gynecology;  Laterality: N/A;  . Abdominal hysterectomy N/A  06/27/2014    Procedure: HYSTERECTOMY ABDOMINAL TOTAL;  Surgeon: Everitt Amber, MD;  Location: [facility];  Service: Gynecology;  Laterality: N/A;  . Salpingoophorectomy Bilateral 06/27/2014    Procedure: BILATERAL SALPINGO OOPHORECTOMY/TUMOR DEBULKING/LYMPHNODE DISSECTION, OMENTECTOMY;  Surgeon: Everitt Amber, MD;  Location: [facility];  Service: Gynecology;  Laterality: Bilateral;    FAMILY HISTORY Family History  Problem Relation Age of Onset  . Diabetes Mother   . Hypertension Mother   . Arthritis Mother   . Heart disease Mother   . Heart failure Mother   . Other Mother     benign breast mass  . Stroke Father   . Heart disease Father   . Dementia Father   . Diabetes Paternal Grandmother   . Colon cancer Paternal Grandfather     dx. 70s-80s  . Other Daughter     potentially has had negative BRCA testing  . Other Daughter     hysterectomy; potentially has had negative genetic testing    the patient's father died in his 61s with a history of dementia. He had had prior strokes. The patient's mother died in her 25s, with a history of congestive heart failure. Jinna had no brothers, one sister. There is no history of breast or ovarian cancer in the family.  GYNECOLOGIC HISTORY: Menarche age 60, first live birth age 73, she is GX P2, menopause approximately 15 years ago, on hormone replacement until 2010.  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 Her husband [name] works for [employer]. Daughter Paul Half is a Physiological scientist and lives in [place]. Daughter Santiago Bur and her family (husband and 2 children aged 28 and 3 years) currently live with the patient. Zoha is a member of a [employer].   ADVANCED DIRECTIVES: Not in place  HEALTH MAINTENANCE: (Updated October 2014) Social History  Substance Use Topics  . Smoking status: Former Smoker -- 1.00 packs/day for 1 years  . Smokeless tobacco: Never Used  . Alcohol Use: No     Colonoscopy: Never  PAP: Does not recall  Bone density: Never  Lipid panel:   Allergies  Allergen Reactions  . Olmesartan Medoxomil Cough  . Adhesive [Tape] Rash  . Tetracycline Hcl Other (See Comments)  . Venlafaxine Other (See Comments)    severe dry mouth    Current Outpatient Prescriptions  Medication Sig Dispense Refill  . acetaminophen (TYLENOL) 500 MG tablet Take 500 mg by mouth every 6 (six) hours as needed for moderate pain.    [name] ALPRAZolam (XANAX XR) 1 MG 24 hr tablet Take 1 tablet (1 mg total) by mouth 2 (two) times daily. 180 tablet 0  . buPROPion (WELLBUTRIN XL) 150 MG 24 hr tablet Take 1 tablet (150 mg total) by mouth daily. 90 tablet 1  . clonazePAM (KLONOPIN) 0.5 MG tablet Take 1 tablet (0.5 mg total) by mouth 2 (two) times daily. 60 tablet 2  . docusate sodium (COLACE) 100 MG capsule Take 1  capsule (100 mg total) by mouth 2 (two) times daily. 20 capsule 0  . enoxaparin (LOVENOX) 100 MG/ML injection Inject 1 mL (100 mg total) into the skin at bedtime. 90 Syringe 2  . escitalopram (LEXAPRO) 20 MG tablet Take 1 tablet (20 mg total) by mouth every morning. 90 tablet 1  . famotidine (PEPCID) 20 MG tablet Take 1 tablet (20 mg total) by mouth daily. (Patient taking differently: Take 20 mg  by mouth daily as needed for heartburn or indigestion. ) 30 tablet 0  . furosemide (LASIX) 20 MG tablet Take 1 tablet (20 mg total) by mouth daily. 90 tablet 1  . letrozole (FEMARA) 2.5 MG tablet Take 1 tablet (2.5 mg total) by mouth daily. 90 tablet 4  . levothyroxine (SYNTHROID, LEVOTHROID) 150 MCG tablet Take 1 tablet (150 mcg total) by mouth daily. 90 tablet 1  . lidocaine-prilocaine (EMLA) cream Apply over port area 1-2 hours before chemotherapy 30 g 0  . metoprolol (LOPRESSOR) 50 MG tablet Take 1 tablet (50 mg total) by mouth 2 (two) times daily. 180 tablet 1  . Nutritional Supplements (ENSURE HIGH PROTEIN PO) Take 237 mLs by mouth 2 (two) times daily.    [name] OLANZapine (ZYPREXA) 2.5 MG tablet Take 1 tablet (2.5 mg total) by mouth at bedtime. 90 tablet 1  . omeprazole (PRILOSEC) 20 MG capsule TAKE 1 CAPSULE EVERY DAY 30 capsule 0  . ondansetron (ZOFRAN) 8 MG tablet Take 1 tablet (8 mg total) by mouth 2 (two) times daily. Start the day after chemo for 2 days. Then take as needed for nausea or vomiting. 30 tablet 1  . potassium chloride (K-DUR) 10 MEQ tablet Take 1 tablet (10 mEq total) by mouth 2 (two) times daily. 180 tablet 1  . prochlorperazine (COMPAZINE) 10 MG tablet Take 1 tablet (10 mg total) by mouth every 6 (six) hours as needed (Nausea or vomiting). 30 tablet 1   No current facility-administered medications for this visit.    OBJECTIVE: Middle-aged white woman in no acute distress Filed Vitals:   06/25/15 1049  BP: 149/61  Pulse: 76  Temp: 97.4 F (36.3 C)  Resp: 18     Body mass index  is 33.85 kg/(m^2).    ECOG FS: 1 Filed Weights   06/25/15 1049  Weight: 197 lb 4.8 oz (89.495 kg)   Sclerae unicteric, pupils round and equal Oropharynx clear and moist-- no thrush or other lesions No cervical or supraclavicular adenopathy Lungs no rales or rhonchi Heart regular rate and rhythm Abd soft, nontender, positive bowel sounds MSK no focal spinal tenderness, no upper extremity lymphedema Neuro: nonfocal, well oriented, appropriate affect Breasts: The right breast is status post mastectomy. There is no evidence of local recurrence. The right axilla is benign. Left breast is unremarkable. Skin: She has rosacea, left greater than right    Lab Results  Component Value Date   WBC 6.7 03/30/2015   NEUTROABS 6.0 03/30/2015   HGB 10.7* 03/30/2015   HCT 33.1* 03/30/2015   MCV 84.8 03/30/2015   PLT 94* 03/30/2015  Results for NADEAN, MONTANARO (MRN [id number]) as of 04/03/2015 17:34  Ref. Range 12/25/2014 09:55 01/08/2015 09:05 02/05/2015 08:03 03/05/2015 08:08 03/30/2015 09:50  CA 125 Latest Ref Range: <35 U/mL 88 (H) 103 (H) 77 (H) 70 (H) 29      Chemistry      Component Value Date/Time   NA 139 03/30/2015 0950   NA 137 02/21/2015 0955   K 3.9 03/30/2015 0950   K 4.0 02/21/2015 0955   CL 112* 02/21/2015 0955   CL 104 10/29/2012 1058   CO2 24 03/30/2015 0950   CO2 19* 02/21/2015 0955   BUN 8.3 03/30/2015 0950   BUN 10 02/21/2015 0955   CREATININE 0.6 03/30/2015 0950   CREATININE 0.45 02/21/2015 0955   CREATININE 0.69 11/10/2013 0828      Component Value Date/Time   CALCIUM 8.7 03/30/2015 0950   CALCIUM 8.2* 02/21/2015  0955   ALKPHOS 156* 03/30/2015 0950   ALKPHOS 152* 08/18/2014 1320   AST 22 03/30/2015 0950   AST 40* 08/18/2014 1320   ALT 15 03/30/2015 0950   ALT 20 08/18/2014 1320   BILITOT 1.08 03/30/2015 0950   BILITOT 0.8 08/18/2014 1320     STUDIES: Mm Screening Breast Tomo Uni L  06/07/2015  CLINICAL DATA:  Screening. History of treated right breast  cancer, status post mastectomy in 2014. Patient also has a history of excisional biopsy for high risk lesion in the left breast in 2002. EXAM: DIGITAL SCREENING UNILATERAL LEFT MAMMOGRAM WITH TOMO AND CAD COMPARISON:  Previous exam(s). ACR Breast Density Category c: The breast tissue is heterogeneously dense, which may obscure small masses. FINDINGS: The patient has had a right mastectomy. There are no findings suspicious for malignancy. Images were processed with CAD. IMPRESSION: No mammographic evidence of malignancy. A result letter of this screening mammogram will be mailed directly to the patient. RECOMMENDATION: Screening mammogram in one year.  (Code:SM-L-76M) BI-RADS CATEGORY  1: Negative. Electronically Signed   By: Fidela Salisbury M.D.   On: 06/07/2015 07:54    ASSESSMENT: 60 y.o.  BRCA negative [name]   (1)  status post right mastectomy  06/08/2012 for a pT3, pN1a, stage IIIA invasive ductal carcinoma, grade 2,  estrogen and progesterone receptor positive, HER-2/neu negative, with an MIB-1 of 20%.    (2)  treated in the adjuvant setting with docetaxel/ cyclophosphamide given every 3 weeks. There were multiple and severe complications, and the  patient tolerated only two cycles, last dose 07/29/2012  (3) letrozole started 08/16/2012  (a) osteopenia, with a T score of -1.14 April 2013  (4) postmastectomy radiation completed 12/24/2012  (5)  comorbidities include diabetes, hypertension, chronic liver disease with cirrhosis and fatty liver, and nutritional disturbance with temporarily dependence on PEG feeds (discontinued July 2014).  (6) restaging studies January 2016 show  (a) 8 mm and 4 mm Right middle lobe lung nodules, not metabolically active on PET -- will require follow-up  (b) hypermetabolic 66.5 cm mixed solid/cystic lesion in upper pelvis, with associated adenopathy  (7) status post exploratory laparotomy 06/27/2014 with total abdominal hysterectomy, bilateral  salpingo-oophorectomy, para-aortic lymphadenectomy, omentectomy and radical tumor debulking for a clear cell ovarian cancer, pT1c pN1, stage IIIC .  (8) adjuvant chemotherapy consisting of carboplatin and paclitaxel given weekly days 1 and 8 of each 21 day cycle, for 6-8 cycles as tolerated, started 07/17/2014, with onpro support  (a) day 8 cycle 2 omitted and further treatments cancelled because of an episode of Klebsiella sepsis requiring intensive care unit admission  (9) bilateral lower extremity DVTs documented 09/25/2014 despite being on Coumadin for PAF (subtherapeutic INR) with mobile Right common femoral clot  (a) status post thrombolysis 09/30/2014  (b) on Lovenox daily as of 10/02/2014  (c) IVC filter placed 10/26/2014 as mobile clot still noted Right groin  (d) IVC filter removed 01/30/2015 with documented resolution of the movable thrombus 01/16/2015  (10) started abraxane 10/13/2014, repeated every 14 days for 6 cycles (12 doses), completed 03/26/2015  (11) genetics testing December 04, 2014 through the Breast[facility]Ovarian Cancer Panel [facility] no deleterious mutations in ATM, BARD1, BRCA1, BRCA2, BRIP1, CDH1, CHEK2, FANCC, MLH1, MSH2, MSH6, NBN, PALB2, PMS2, PTEN, RAD51C, RAD51D, STK11, TP53, and XRCC2.    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   PLAN: Dalaysia is tolerating the letrozole without any unusual side effects  and we are continuing on that medication. She just had her left mammogram which is negative. This is very favorable.  She would like to get off the Lovenox, which is causing her some bruising. We are going to check a d-dimer within next set of labs, 6 weeks from now, and if that is favorable we will stop the Lovenox then and then repeat a d-dimer again an month later to confirm safety.  We are checking a CA 125 today. We will continue to follow this closely. I gave her the results of her most  recent determinations.  She has gained quite a bit of weight. Of course we are glad that she is enjoying food since eating in the past has been a major issue for her. I think this is a good weight for her low. I encouraged her to exercise a little more. Of course she is limited because of her knee problems.  It is good to note that her neuropathy is improved.  Today also I'm writing her for MetroGel for her rosacea.  Next set of labs and flush will be in 6 weeks. She will see me a week later to discuss results. She knows to call for any problems that may develop before her next visit here. Chauncey Cruel, MD    06/25/2015

## 2015-06-25 NOTE — Telephone Encounter (Signed)
Appointments made and avs printed °

## 2015-06-25 NOTE — Patient Instructions (Signed)

## 2015-06-26 ENCOUNTER — Telehealth: Payer: Self-pay

## 2015-06-26 LAB — CA 125: Cancer Antigen (CA) 125: 23.8 U/mL (ref 0.0–38.1)

## 2015-06-26 LAB — CANCER ANTIGEN 125 (PARALLEL TESTING): CA 125: 26 U/mL (ref <35)

## 2015-06-26 NOTE — Telephone Encounter (Signed)
Husband called requesting lab results from yesterday. Gave him numbers of CA125.

## 2015-06-27 ENCOUNTER — Other Ambulatory Visit: Payer: Self-pay | Admitting: Oncology

## 2015-06-27 ENCOUNTER — Telehealth: Payer: Self-pay

## 2015-06-27 NOTE — Telephone Encounter (Signed)
Patient's husband called looking for patient's most recent lab results.  Per Dr. Jana Hakim the most recent lab results are normal.  Writer called and spoke with patient's husband and let him know that the results came back normal.  Husband stated understanding.

## 2015-07-10 ENCOUNTER — Encounter: Payer: Self-pay | Admitting: Family Medicine

## 2015-07-10 ENCOUNTER — Telehealth: Payer: Self-pay | Admitting: Family

## 2015-07-10 ENCOUNTER — Ambulatory Visit (INDEPENDENT_AMBULATORY_CARE_PROVIDER_SITE_OTHER): Payer: Medicare Other | Admitting: Family Medicine

## 2015-07-10 VITALS — BP 130/68 | HR 76 | Temp 99.3°F | Wt 194.8 lb

## 2015-07-10 DIAGNOSIS — R509 Fever, unspecified: Secondary | ICD-10-CM | POA: Diagnosis not present

## 2015-07-10 DIAGNOSIS — F5089 Other specified eating disorder: Secondary | ICD-10-CM

## 2015-07-10 DIAGNOSIS — J1189 Influenza due to unidentified influenza virus with other manifestations: Secondary | ICD-10-CM

## 2015-07-10 DIAGNOSIS — J111 Influenza due to unidentified influenza virus with other respiratory manifestations: Secondary | ICD-10-CM

## 2015-07-10 LAB — POCT INFLUENZA A/B
INFLUENZA A, POC: NEGATIVE
INFLUENZA B, POC: NEGATIVE

## 2015-07-10 MED ORDER — AZITHROMYCIN 250 MG PO TABS: ORAL_TABLET | ORAL | Status: DC

## 2015-07-10 MED ORDER — OSELTAMIVIR PHOSPHATE 75 MG PO CAPS: 75.0000 mg | ORAL_CAPSULE | Freq: Two times a day (BID) | ORAL | Status: DC

## 2015-07-10 MED ORDER — FLUTICASONE PROPIONATE 50 MCG/ACT NA SUSP: 2.0000 | Freq: Every day | NASAL | Status: DC

## 2015-07-10 MED ORDER — HYDROCODONE-HOMATROPINE 5-1.5 MG/5ML PO SYRP: 5.0000 mL | ORAL_SOLUTION | Freq: Three times a day (TID) | ORAL | Status: DC | PRN

## 2015-07-10 NOTE — Telephone Encounter (Signed)
Patient Name: [name]  DOB: [birth year]    Initial Comment Caller states his wife has a 102 fever. Cough   Nurse Assessment  Nurse: Thad Ranger, RN, Langley Gauss Date/Time (Eastern Time): 07/10/2015 2:25:39 PM  Confirm and document reason for call. If symptomatic, describe symptoms. You must click the next button to save text entered. ---Pt states she has fever of 10 Oral, cough, and nasal congestion. S/S started today.  Has the patient traveled out of the country within the last 30 days? ---Not Applicable  Does the patient have any new or worsening symptoms? ---Yes  Will a triage be completed? ---Yes  Related visit to physician within the last 2 weeks? ---No  Does the PT have any chronic conditions? (i.e. diabetes, asthma, etc.) ---Yes  List chronic conditions. ---Ovarian Cancer not actively taking chemo/radiation.  Is this a behavioral health or substance abuse call? ---No     Guidelines    Guideline Title Affirmed Question Affirmed Notes  Sinus Pain or Congestion [1] Fever > 101 F (38.3 C) AND [2] age > 60    Final Disposition User   See Physician within 4 Hours (or PCP triage) Thad Ranger, RN, Langley Gauss    Comments  Attempted to make an appt with Garnet Koyanagi (only avail appt today) at 1730, but the pt refused. Advised pt to be seen in [facility] in [place], but the pt refused this too. States she will call in the am to make an appt.   Referrals  REFERRED TO PCP OFFICE  GO TO FACILITY REFUSED   Disagree/Comply: Comply

## 2015-07-10 NOTE — Progress Notes (Addendum)
Subjective:     [name] is a 60 y.o. female who presents for evaluation of symptoms of a URI. Symptoms include achiness, cough described as productive and fever 102. Onset of symptoms was 1 day ago, and has been gradually worsening since that time. Treatment to date: none.  The following portions of the patient's history were reviewed and updated as appropriate:  She  has a past medical history of Depression; DVT (deep venous thrombosis) ([place]); GERD (gastroesophageal reflux disease); Hyperlipidemia; Hypertension; Hypothyroidism; PPD positive, treated; Diabetes mellitus; Liver disease, chronic, with cirrhosis ([place]); DJD (degenerative joint disease) of lumbar spine; Chronic combined systolic and diastolic CHF (congestive heart failure) ([place]); Paroxysmal atrial fibrillation ([place]); Physical deconditioning; Allergy; Anemia; Cataract; OSA on CPAP; Breast cancer ([place]) (2014); and Clear Cell Ovarian Cancer. She  does not have any pertinent problems on file. She  has past surgical history that includes Tubal ligation; Cholecystectomy; Foot surgery; Eye surgery; Cataract extraction; Percutaneous liver biopsy; Breast biopsy; Mastectomy modified radical (06/08/2012); Portacath placement (06/08/2012); PEG tube placement; history of chemotherapy x 2 treatments, radiation tx (2014); pac removed (2015); laparotomy (N/A, 06/27/2014); Abdominal hysterectomy (N/A, 06/27/2014); and Salpingoophorectomy (Bilateral, 06/27/2014). Her family history includes Arthritis in her mother; Colon cancer in her paternal grandfather; Dementia in her father; Diabetes in her mother and paternal grandmother; Heart disease in her father and mother; Heart failure in her mother; Hypertension in her mother; Other in her daughter, daughter, and mother; Stroke in her father. She  reports that she has quit smoking. She has never used smokeless tobacco. She reports that she does not drink alcohol or use illicit drugs. She has a current medication  list which includes the following prescription(s): acetaminophen, alprazolam, bupropion, clonazepam, docusate sodium, enoxaparin, escitalopram, famotidine, furosemide, letrozole, levothyroxine, lidocaine-prilocaine, metoprolol, metronidazole, nutritional supplements, olanzapine, omeprazole, ondansetron, potassium chloride, and prochlorperazine. Current Outpatient Prescriptions on File Prior to Visit  Medication Sig Dispense Refill  . acetaminophen (TYLENOL) 500 MG tablet Take 500 mg by mouth every 6 (six) hours as needed for moderate pain.    [name] ALPRAZolam (XANAX XR) 1 MG 24 hr tablet Take 1 tablet (1 mg total) by mouth 2 (two) times daily. 180 tablet 0  . buPROPion (WELLBUTRIN XL) 150 MG 24 hr tablet Take 1 tablet (150 mg total) by mouth daily. 90 tablet 1  . clonazePAM (KLONOPIN) 0.5 MG tablet Take 1 tablet (0.5 mg total) by mouth 2 (two) times daily. 60 tablet 2  . docusate sodium (COLACE) 100 MG capsule Take 1 capsule (100 mg total) by mouth 2 (two) times daily. 20 capsule 0  . enoxaparin (LOVENOX) 100 MG/ML injection Inject 1 mL (100 mg total) into the skin at bedtime. 90 Syringe 2  . escitalopram (LEXAPRO) 20 MG tablet Take 1 tablet (20 mg total) by mouth every morning. 90 tablet 1  . famotidine (PEPCID) 20 MG tablet Take 1 tablet (20 mg total) by mouth daily. (Patient taking differently: Take 20 mg by mouth daily as needed for heartburn or indigestion. ) 30 tablet 0  . furosemide (LASIX) 20 MG tablet Take 1 tablet (20 mg total) by mouth daily. 90 tablet 1  . letrozole (FEMARA) 2.5 MG tablet Take 1 tablet (2.5 mg total) by mouth daily. 90 tablet 4  . levothyroxine (SYNTHROID, LEVOTHROID) 150 MCG tablet Take 1 tablet (150 mcg total) by mouth daily. 90 tablet 1  . lidocaine-prilocaine (EMLA) cream Apply over port area 1-2 hours before chemotherapy 30 g 0  . metoprolol (LOPRESSOR) 50 MG tablet  Take 1 tablet (50 mg total) by mouth 2 (two) times daily. 180 tablet 1  . metroNIDAZOLE (METROGEL) 1 % gel  Apply topically daily. 45 g 0  . Nutritional Supplements (ENSURE HIGH PROTEIN PO) Take 237 mLs by mouth 2 (two) times daily.    [name] OLANZapine (ZYPREXA) 2.5 MG tablet Take 1 tablet (2.5 mg total) by mouth at bedtime. 90 tablet 1  . omeprazole (PRILOSEC) 20 MG capsule TAKE 1 CAPSULE EVERY DAY 30 capsule 0  . ondansetron (ZOFRAN) 8 MG tablet Take 1 tablet (8 mg total) by mouth 2 (two) times daily. Start the day after chemo for 2 days. Then take as needed for nausea or vomiting. 30 tablet 1  . potassium chloride (K-DUR) 10 MEQ tablet Take 1 tablet (10 mEq total) by mouth 2 (two) times daily. 180 tablet 1  . prochlorperazine (COMPAZINE) 10 MG tablet Take 1 tablet (10 mg total) by mouth every 6 (six) hours as needed (Nausea or vomiting). 30 tablet 1   No current facility-administered medications on file prior to visit.   She is allergic to olmesartan medoxomil; adhesive; tetracycline hcl; and venlafaxine. .  Review of Systems Pertinent items are noted in HPI.   Objective:    BP 130/68 mmHg  Pulse 76  Temp(Src) 99.3 F (37.4 C) (Oral)  Wt 194 lb 12.8 oz (88.361 kg)  SpO2 98% General appearance: alert, cooperative, appears stated age and no distress Ears: normal TM's and external ear canals both ears Nose: turb red and swollen, clear mucus.   Throat: lips, mucosa, and tongue normal; teeth and gums normal Neck: mild anterior cervical adenopathy, supple, symmetrical, trachea midline and thyroid not enlarged, symmetric, no tenderness/mass/nodules Lungs: clear to auscultation bilaterally Heart: S1, S2 normal   Assessment:    influenza and viral upper respiratory illness   Plan:    Suggested symptomatic OTC remedies. Nasal saline spray for congestion. Follow up as needed. tamiflu

## 2015-07-10 NOTE — Patient Instructions (Signed)
Influenza, Adult Influenza ("the flu") is a viral infection of the respiratory tract. It occurs more often in winter months because people spend more time in close contact with one another. Influenza can make you feel very sick. Influenza easily spreads from person to person (contagious). CAUSES  Influenza is caused by a virus that infects the respiratory tract. You can catch the virus by breathing in droplets from an infected person's cough or sneeze. You can also catch the virus by touching something that was recently contaminated with the virus and then touching your mouth, nose, or eyes. RISKS AND COMPLICATIONS You may be at risk for a more severe case of influenza if you smoke cigarettes, have diabetes, have chronic heart disease (such as heart failure) or lung disease (such as asthma), or if you have a weakened immune system. Elderly people and pregnant women are also at risk for more serious infections. The most common problem of influenza is a lung infection (pneumonia). Sometimes, this problem can require emergency medical care and may be life threatening. SIGNS AND SYMPTOMS  Symptoms typically last 4 to 10 days and may include:  Fever.  Chills.  Headache, body aches, and muscle aches.  Sore throat.  Chest discomfort and cough.  Poor appetite.  Weakness or feeling tired.  Dizziness.  Nausea or vomiting. DIAGNOSIS  Diagnosis of influenza is often made based on your history and a physical exam. A nose or throat swab test can be done to confirm the diagnosis. TREATMENT  In mild cases, influenza goes away on its own. Treatment is directed at relieving symptoms. For more severe cases, your health care provider may prescribe antiviral medicines to shorten the sickness. Antibiotic medicines are not effective because the infection is caused by a virus, not by bacteria. HOME CARE INSTRUCTIONS  Take medicines only as directed by your health care provider.  Use a cool mist humidifier  to make breathing easier.  Get plenty of rest until your temperature returns to normal. This usually takes 3 to 4 days.  Drink enough fluid to keep your urine clear or pale yellow.  Cover yourmouth and nosewhen coughing or sneezing,and wash your handswellto prevent thevirusfrom spreading.  Stay homefromwork orschool untilthe fever is gonefor at least 1full day. PREVENTION  An annual influenza vaccination (flu shot) is the best way to avoid getting influenza. An annual flu shot is now routinely recommended for all adults in the [place] SEEK MEDICAL CARE IF:  You experiencechest pain, yourcough worsens,or you producemore mucus.  Youhave nausea,vomiting, ordiarrhea.  Your fever returns or gets worse. SEEK IMMEDIATE MEDICAL CARE IF:  You havetrouble breathing, you become short of breath,or your skin ornails becomebluish.  You have severe painor stiffnessin the neck.  You develop a sudden headache, or pain in the face or ear.  You have nausea or vomiting that you cannot control. MAKE SURE YOU:   Understand these instructions.  Will watch your condition.  Will get help right away if you are not doing well or get worse.   This information is not intended to replace advice given to you by your health care provider. Make sure you discuss any questions you have with your health care provider.   Document Released: 04/25/2000 Document Revised: 05/19/2014 Document Reviewed: 07/28/2011 Elsevier Interactive Patient Education 2016 [employer]   

## 2015-07-10 NOTE — Telephone Encounter (Signed)
Spoke to [place].  She agrees that patient needs to be seen today.  Called patient and urged her to be seen today.   She agreed to be seen.  Appt scheduled.

## 2015-07-10 NOTE — Progress Notes (Signed)
Pre visit review using our clinic review tool, if applicable. No additional management support is needed unless otherwise documented below in the visit note. 

## 2015-07-10 NOTE — Telephone Encounter (Signed)
Called to follow up with patient.  Pt c/o:  Productive cough, nasal congestion, and fever.  She says the fever is starting to break after 2 tylenol tablets at 2pm.  She denies chest pain, chest tightness, wheezing, shortness of breath, chills, body aches.  She says that she had body aches, but none present today.  She is currently wearing her CPAP, but says that she's only wearing it for comfort.  Pt did not want to see Dr. Etter Sjogren, would rather see Melissa.    Advice:  Appt schedule with Debbrah Alar, NP tomorrow (2/29/17) at 8:30 am.  Continue to take tylenol as directed on the bottle for fever.  Take delsym as directed on the bottle for cough and normal saline spray for nasal congestion.  Drink plenty of fluids.  If symptoms worsen or new symptoms develop to go to [facility] or ER for evaluation.  Pt stated understanding and agreed to comply.    Message routed to PCP for FYI.

## 2015-07-10 NOTE — Telephone Encounter (Signed)
Husband [name]) called stating that patient is a former Cancer patient and is having a bad cough with a fever of 102. Patient is sick on stomach. Transferred to [facility]. Spoke with Maudie Mercury

## 2015-07-11 ENCOUNTER — Ambulatory Visit: Payer: Self-pay | Admitting: Family

## 2015-07-11 LAB — CBC WITH DIFFERENTIAL/PLATELET
Basophils Absolute: 0 10*3/uL (ref 0.0–0.1)
Basophils Relative: 0.3 % (ref 0.0–3.0)
EOS PCT: 1.1 % (ref 0.0–5.0)
Eosinophils Absolute: 0.1 10*3/uL (ref 0.0–0.7)
HCT: 41.5 % (ref 36.0–46.0)
HEMOGLOBIN: 13.8 g/dL (ref 12.0–15.0)
LYMPHS ABS: 0.5 10*3/uL — AB (ref 0.7–4.0)
Lymphocytes Relative: 10.4 % — ABNORMAL LOW (ref 12.0–46.0)
MCHC: 33.3 g/dL (ref 30.0–36.0)
MCV: 83.2 fl (ref 78.0–100.0)
MONOS PCT: 5.1 % (ref 3.0–12.0)
Monocytes Absolute: 0.2 10*3/uL (ref 0.1–1.0)
NEUTROS PCT: 83.1 % — AB (ref 43.0–77.0)
Neutro Abs: 3.9 10*3/uL (ref 1.4–7.7)
Platelets: 121 10*3/uL — ABNORMAL LOW (ref 150.0–400.0)
RBC: 4.98 Mil/uL (ref 3.87–5.11)
RDW: 16.3 % — ABNORMAL HIGH (ref 11.5–15.5)
WBC: 4.7 10*3/uL (ref 4.0–10.5)

## 2015-07-11 LAB — IBC PANEL
Iron: 122 ug/dL (ref 42–145)
Saturation Ratios: 23.9 % (ref 20.0–50.0)
Transferrin: 364 mg/dL — ABNORMAL HIGH (ref 212.0–360.0)

## 2015-07-11 LAB — COMPREHENSIVE METABOLIC PANEL
ALK PHOS: 157 U/L — AB (ref 39–117)
ALT: 21 U/L (ref 0–35)
AST: 33 U/L (ref 0–37)
Albumin: 3.9 g/dL (ref 3.5–5.2)
BUN: 5 mg/dL — ABNORMAL LOW (ref 6–23)
CO2: 23 mEq/L (ref 19–32)
Calcium: 9.5 mg/dL (ref 8.4–10.5)
Chloride: 99 mEq/L (ref 96–112)
Creatinine, Ser: 0.7 mg/dL (ref 0.40–1.20)
GFR: 90.31 mL/min (ref >60.00)
GLUCOSE: 226 mg/dL — AB (ref 70–99)
Potassium: 3.9 mEq/L (ref 3.5–5.1)
Sodium: 130 mEq/L — ABNORMAL LOW (ref 135–145)
TOTAL PROTEIN: 7.5 g/dL (ref 6.0–8.3)
Total Bilirubin: 1 mg/dL (ref 0.2–1.2)

## 2015-07-11 LAB — FERRITIN: FERRITIN: 21.2 ng/mL (ref 10.0–291.0)

## 2015-07-17 ENCOUNTER — Other Ambulatory Visit: Payer: Self-pay | Admitting: Family

## 2015-07-17 NOTE — Telephone Encounter (Signed)
Medication filled to pharmacy as requested.   

## 2015-07-18 ENCOUNTER — Other Ambulatory Visit: Payer: Self-pay

## 2015-07-18 DIAGNOSIS — R739 Hyperglycemia, unspecified: Secondary | ICD-10-CM

## 2015-07-19 ENCOUNTER — Other Ambulatory Visit (INDEPENDENT_AMBULATORY_CARE_PROVIDER_SITE_OTHER): Payer: Medicare Other

## 2015-07-19 DIAGNOSIS — R739 Hyperglycemia, unspecified: Secondary | ICD-10-CM | POA: Diagnosis not present

## 2015-07-19 LAB — COMPREHENSIVE METABOLIC PANEL
ALBUMIN: 3.7 g/dL (ref 3.5–5.2)
ALK PHOS: 167 U/L — AB (ref 39–117)
ALT: 28 U/L (ref 0–35)
AST: 46 U/L — ABNORMAL HIGH (ref 0–37)
BILIRUBIN TOTAL: 0.7 mg/dL (ref 0.2–1.2)
BUN: 6 mg/dL (ref 6–23)
CO2: 26 mEq/L (ref 19–32)
Calcium: 9.1 mg/dL (ref 8.4–10.5)
Chloride: 103 mEq/L (ref 96–112)
Creatinine, Ser: 0.63 mg/dL (ref 0.40–1.20)
GFR: 101.98 mL/min (ref >60.00)
GLUCOSE: 354 mg/dL — AB (ref 70–99)
POTASSIUM: 4.1 meq/L (ref 3.5–5.1)
Sodium: 136 mEq/L (ref 135–145)
TOTAL PROTEIN: 7 g/dL (ref 6.0–8.3)

## 2015-07-19 LAB — HEMOGLOBIN A1C: HEMOGLOBIN A1C: 10.4 % — AB (ref 4.6–6.5)

## 2015-07-30 ENCOUNTER — Other Ambulatory Visit: Payer: Self-pay

## 2015-07-30 MED ORDER — GLUCOSE BLOOD VI STRP: ORAL_STRIP | Status: DC

## 2015-07-30 MED ORDER — ONETOUCH DELICA LANCETS FINE MISC: Status: DC

## 2015-07-30 MED ORDER — ONETOUCH VERIO IQ SYSTEM W/DEVICE KIT: PACK | Status: DC

## 2015-07-30 MED ORDER — METFORMIN HCL ER 500 MG PO TB24: 500.0000 mg | ORAL_TABLET | Freq: Every day | ORAL | Status: DC

## 2015-08-01 ENCOUNTER — Telehealth: Payer: Self-pay | Admitting: Internal Medicine

## 2015-08-01 NOTE — Telephone Encounter (Signed)
Pt needs a new CPAP mask and machine -- [facility] Pt last seen 2013 and is aware that an appt is needed before we can send an order.  Pt husband states that he will call back in the morning to schedule the appt.  Will await call back.

## 2015-08-02 NOTE — Telephone Encounter (Signed)
Spoke with pt's husband and scheduled appt with CY for 10/26/15. Offered sooner appts with other MD's but they requested to stay with CY. No further questions or concerns.

## 2015-08-02 NOTE — Telephone Encounter (Signed)
Pt husband called to set up the appointment with CY (sch full til June now), CB is [phone].

## 2015-08-06 ENCOUNTER — Ambulatory Visit (HOSPITAL_BASED_OUTPATIENT_CLINIC_OR_DEPARTMENT_OTHER): Payer: Medicare Other

## 2015-08-06 ENCOUNTER — Other Ambulatory Visit (HOSPITAL_BASED_OUTPATIENT_CLINIC_OR_DEPARTMENT_OTHER): Payer: Medicare Other

## 2015-08-06 VITALS — BP 132/56 | HR 70 | Temp 98.0°F | Resp 16

## 2015-08-06 DIAGNOSIS — Z95828 Presence of other vascular implants and grafts: Secondary | ICD-10-CM

## 2015-08-06 DIAGNOSIS — I4891 Unspecified atrial fibrillation: Secondary | ICD-10-CM

## 2015-08-06 DIAGNOSIS — C50911 Malignant neoplasm of unspecified site of right female breast: Secondary | ICD-10-CM | POA: Diagnosis not present

## 2015-08-06 DIAGNOSIS — C569 Malignant neoplasm of unspecified ovary: Secondary | ICD-10-CM

## 2015-08-06 DIAGNOSIS — K760 Fatty (change of) liver, not elsewhere classified: Secondary | ICD-10-CM

## 2015-08-06 LAB — COMPREHENSIVE METABOLIC PANEL
ALBUMIN: 3.2 g/dL — AB (ref 3.5–5.0)
ALK PHOS: 159 U/L — AB (ref 40–150)
ALT: 24 U/L (ref 0–55)
ANION GAP: 10 meq/L (ref 3–11)
AST: 39 U/L — ABNORMAL HIGH (ref 5–34)
BILIRUBIN TOTAL: 1.32 mg/dL — AB (ref 0.20–1.20)
BUN: 6.1 mg/dL — ABNORMAL LOW (ref 7.0–26.0)
CO2: 25 meq/L (ref 22–29)
CREATININE: 0.8 mg/dL (ref 0.6–1.1)
Calcium: 9.2 mg/dL (ref 8.4–10.4)
Chloride: 104 mEq/L (ref 98–109)
EGFR: 82 mL/min/{1.73_m2} — ABNORMAL LOW (ref >90)
GLUCOSE: 266 mg/dL — AB (ref 70–140)
Potassium: 4 mEq/L (ref 3.5–5.1)
SODIUM: 138 meq/L (ref 136–145)
TOTAL PROTEIN: 7.2 g/dL (ref 6.4–8.3)

## 2015-08-06 LAB — CBC WITH DIFFERENTIAL/PLATELET
BASO%: 0.3 % (ref 0.0–2.0)
Basophils Absolute: 0 10*3/uL (ref 0.0–0.1)
EOS ABS: 0.1 10*3/uL (ref 0.0–0.5)
EOS%: 2.5 % (ref 0.0–7.0)
HCT: 39.9 % (ref 34.8–46.6)
HEMOGLOBIN: 13.1 g/dL (ref 11.6–15.9)
LYMPH%: 25.6 % (ref 14.0–49.7)
MCH: 28.1 pg (ref 25.1–34.0)
MCHC: 32.8 g/dL (ref 31.5–36.0)
MCV: 85.6 fL (ref 79.5–101.0)
MONO#: 0.2 10*3/uL (ref 0.1–0.9)
MONO%: 6.9 % (ref 0.0–14.0)
NEUT%: 64.7 % (ref 38.4–76.8)
NEUTROS ABS: 2.1 10*3/uL (ref 1.5–6.5)
NRBC: 0 % (ref 0–0)
PLATELETS: 106 10*3/uL — AB (ref 145–400)
RBC: 4.66 10*6/uL (ref 3.70–5.45)
RDW: 15.5 % — ABNORMAL HIGH (ref 11.2–14.5)
WBC: 3.2 10*3/uL — AB (ref 3.9–10.3)
lymph#: 0.8 10*3/uL — ABNORMAL LOW (ref 0.9–3.3)

## 2015-08-06 MED ORDER — SODIUM CHLORIDE 0.9% FLUSH
10.0000 mL | INTRAVENOUS | Status: DC | PRN
  Administered 2015-08-06: 10 mL via INTRAVENOUS
  Filled 2015-08-06: qty 10

## 2015-08-06 MED ORDER — HEPARIN SOD (PORK) LOCK FLUSH 100 UNIT/ML IV SOLN
500.0000 [IU] | Freq: Once | INTRAVENOUS | Status: AC
  Administered 2015-08-06: 500 [IU] via INTRAVENOUS
  Filled 2015-08-06: qty 5

## 2015-08-06 NOTE — Patient Instructions (Signed)

## 2015-08-07 LAB — CANCER ANTIGEN 125 (PARALLEL TESTING): CA 125: 22 U/mL (ref <35)

## 2015-08-07 LAB — CA 125: CANCER ANTIGEN (CA) 125: 23.5 U/mL (ref 0.0–38.1)

## 2015-08-07 LAB — D-DIMER, QUANTITATIVE (NOT AT ARMC)

## 2015-08-13 ENCOUNTER — Ambulatory Visit (HOSPITAL_BASED_OUTPATIENT_CLINIC_OR_DEPARTMENT_OTHER): Payer: Medicare Other | Admitting: Oncology

## 2015-08-13 ENCOUNTER — Telehealth: Payer: Self-pay | Admitting: Oncology

## 2015-08-13 VITALS — BP 130/83 | HR 69 | Temp 97.6°F | Resp 17 | Ht 64.0 in | Wt 193.6 lb

## 2015-08-13 DIAGNOSIS — Z79811 Long term (current) use of aromatase inhibitors: Secondary | ICD-10-CM | POA: Diagnosis not present

## 2015-08-13 DIAGNOSIS — C50911 Malignant neoplasm of unspecified site of right female breast: Secondary | ICD-10-CM

## 2015-08-13 DIAGNOSIS — Z86718 Personal history of other venous thrombosis and embolism: Secondary | ICD-10-CM | POA: Diagnosis not present

## 2015-08-13 DIAGNOSIS — C569 Malignant neoplasm of unspecified ovary: Secondary | ICD-10-CM | POA: Diagnosis not present

## 2015-08-13 NOTE — Progress Notes (Signed)
ID: [name]   DOB: January 22, 1956  MR#: [id number]  JQG#:[id number]  PCP: [name]., NP GYN:  SU: [facility] OTHER RF:XJOITG [name], [name], [name]  CHIEF COMPLAINT:  Estrogen receptor positive Breast Cancer; Ovarian cancer; DVT  CURRENT TREATMENT: Letrozole   BREAST CANCER HISTORY: From the original consult note:  [name] noted a mass in her right breast mid December 2013, and as it did not spontaneously resolve over a couple of weeks she brought it to her primary physician's attention. She was set up for diagnostic mammography and right breast ultrasonography at the breast Center 05/10/2012. (Note that the patient's most recent prior mammography had been in October 2008). The current study showed a spiculated mass in the superior subareolar portion of the right breast measuring approximately 5 cm and associated with pleomorphic calcifications. This was firm and palpable. There was right nipple retraction and skin thickening. Ultrasound confirmed an irregularly marginated hypoechoic mass measuring 3.5 cm by ultrasound, and an abnormal appearing lower right axillary lymph node measuring 2.6 cm.  Biopsies of both the breast mass and the abnormal appearing lymph node were performed 05/21/2012. Both showed an invasive ductal carcinoma, grade 2, with similar prognostic panels (the breast mass was 100% estrogen and 73% progesterone receptor positive, with an MIB-1 of 5%; the lymph node was 100% estrogen 100% progesterone receptor positive, with an MIB-1 of 20%). Both masses were HER-2 negative.  Breast MRI obtained at [facility] 05/29/2012 confirmed a dominant mass in the retroareolar right breast measuring 4.4 cm maximally. There was a satellite nodule inferior and lateral to this mass, measuring 1.7 cm. There were no other masses in either breast. Aside from the previously biopsied lymph node there were other mildly enhancing level I right  axillary lymph nodes which did not appear pathologic. There was no other lymphadenopathy noted.   The patient's subsequent history is as detailed below.  OVARIAN CANCER HISTORY: From the 06/16/2014 summary note:  "[name] returns today for review of her restaging studies accompanied by her husband [name]. To summarize her recent history: She was admitted to [facility] with a diagnosis of possible pneumonia in late December 2015. Chest x-ray showed some increased density in the right upper lobe. CT scan was suggested and was obtained by [name]. This showed 2 small nodules in the right middle lobe, measuring 8 and 4 mm respectively. [name] then contacted [name] for further evaluation and we set [name] up for a PET scan and which was performed late January. This showed the 2 nodules in the lung not to be hypermetabolic. This of course does not prove that they are not malignant. More importantly, there was a 13.7 cm cystic/solid mass in the upper pelvis associated with adenopathy,. All of this was hypermetabolic."  The patient was evaluated by gynecologic oncology and on 06/27/2014 she underwent surgery under [name]. This consisted of an exploratory laparotomy with hysterectomy and abdominal salpingo-oophorectomy, as well as tumor debulking and omentectomy. 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 The tumor measured 11.5 cm, focally involve the ovarian capsule on the left; the uterus and right ovary and fallopian tube were unremarkable. One para-aortic lymph node was positive out of a total of 6 lymph nodes sampled (2 left common iliac, 2 left para-aortic, and to within the omental resection).  Her subsequent history is as detailed below   INTERVAL HISTORY: [name] returns  today for follow-up of her separate cancers, ovarian, and breast, accompanied by her husband [name]. As far as the breast  cancer is concerned she continues on letrozole. She does not have significant problems with night sweats or hot flashes and vaginal dryness is not a major issue. She obtains a drug at a good price. She had her most recent mammogram in January and he was unremarkable.  As far as her ovarian cancer is concerned we are following her CA-125 which remains in the normal range.  She also has a history of DVT. We obtained a d-dimer prior to this visit to help [name] decide whether or not to continue that. She tolerates it well but it does cause her some bruising and minor epistaxis.  REVIEW OF SYSTEMS: [name] generally feels well. She has been diagnosed with diabetes by her primary care physician and started on metformin. She is having no diarrhea from this. She is not exercising regularly because her knees hurt. She also had an episode of the flu and was treated with Tamiflu. She feels forgetful, but not anxious or depressed. She is eating well, with no complications. A detailed review of systems today was otherwise noncontributory  PAST MEDICAL HISTORY: Past Medical History  Diagnosis Date  . Depression   . DVT (deep venous thrombosis) (HCC)     hx of on HRT left leg ~2006  . GERD (gastroesophageal reflux disease)   . Hyperlipidemia   . Hypertension   . Hypothyroidism   . PPD positive, treated     rx inh   . Diabetes mellitus   . Liver disease, chronic, with cirrhosis ([place])     ? autoimmune  . DJD (degenerative joint disease) of lumbar spine   . Chronic combined systolic and diastolic CHF (congestive heart failure) ([place])     a. 08/2012 Echo: EF 55-60%, no rwma, mod MR;  b. 07/2014 Echo: EF 45-50%, distal antsept HK, mod TR/MR, mildly bil-atrial enlargement.  . Paroxysmal atrial fibrillation (HCC)     a. CHA2DS2VASc = 4-->coumadin.  [name] Physical deconditioning   . Allergy   . Anemia     low iron hx  . Cataract     removed ou  . OSA on CPAP     cpap 4.5 setting  . Breast cancer ([place]) 2014    a.  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 Clear Cell Ovarian Cancer     a. 06/2014 s/p TAH/BSO debulking/lymph node dissection;  b. Now on chemo.    PAST SURGICAL HISTORY: Past Surgical History  Procedure Laterality Date  . Tubal ligation    . Cholecystectomy    . Foot surgery    . Eye surgery    . Cataract extraction    . Percutaneous liver biopsy    . Breast biopsy      left breast  . Mastectomy modified radical  06/08/2012    Procedure: MASTECTOMY MODIFIED RADICAL;  Surgeon: Edward Jolly, MD;  Location: [place];  Service: General;  Laterality: Right;  . Portacath placement  06/08/2012    Procedure: INSERTION PORT-A-CATH;  Surgeon: Edward Jolly, MD;  Location: [place];  Service: General;  Laterality: Left;  . Peg tube placement      peg removed 2015  . History of chemotherapy x 2 treatments, radiation tx  2014  . Pac removed  2015  . Laparotomy N/A 06/27/2014  Procedure: EXPLORATORY LAPAROTOMY;  Surgeon: Everitt Amber, MD;  Location: [facility];  Service: Gynecology;  Laterality: N/A;  . Abdominal hysterectomy N/A 06/27/2014    Procedure: HYSTERECTOMY ABDOMINAL TOTAL;  Surgeon: Everitt Amber, MD;  Location: [facility];  Service: Gynecology;  Laterality: N/A;  . Salpingoophorectomy Bilateral 06/27/2014    Procedure: BILATERAL SALPINGO OOPHORECTOMY/TUMOR DEBULKING/LYMPHNODE DISSECTION, OMENTECTOMY;  Surgeon: Everitt Amber, MD;  Location: [facility];  Service: Gynecology;  Laterality: Bilateral;    FAMILY HISTORY Family History  Problem Relation Age of Onset  . Diabetes Mother   . Hypertension Mother   . Arthritis Mother   . Heart disease Mother   . Heart failure Mother   . Other Mother     benign breast mass  . Stroke Father   . Heart disease Father   . Dementia Father   . Diabetes Paternal Grandmother   . Colon cancer Paternal Grandfather     dx. 70s-80s  . Other Daughter      potentially has had negative BRCA testing  . Other Daughter     hysterectomy; potentially has had negative genetic testing   the patient's father died in his 38s with a history of dementia. He had had prior strokes. The patient's mother died in her 78s, with a history of congestive heart failure. [name] had no brothers, one sister. There is no history of breast or ovarian cancer in the family.  GYNECOLOGIC HISTORY: Menarche age 68, first live birth age 36, she is GX P2, menopause approximately 15 years ago, on hormone replacement until 2010.  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 Her husband [name] works for [employer]. Daughter Paul Half is a Physiological scientist and lives in [place]. Daughter Santiago Bur and her family (husband and 2 children aged 14 and 3 years) currently live with the patient. Deondrea is a member of a [employer].   ADVANCED DIRECTIVES: Not in place  HEALTH MAINTENANCE: (Updated October 2014) Social History  Substance Use Topics  . Smoking status: Former Smoker -- 1.00 packs/day for 1 years  . Smokeless tobacco: Never Used  . Alcohol Use: No     Colonoscopy: Never  PAP: Does not recall  Bone density: Never  Lipid panel:   Allergies  Allergen Reactions  . Olmesartan Medoxomil Cough  . Adhesive [Tape] Rash  . Tetracycline Hcl Other (See Comments)  . Venlafaxine Other (See Comments)    severe dry mouth    Current Outpatient Prescriptions  Medication Sig Dispense Refill  . acetaminophen (TYLENOL) 500 MG tablet Take 500 mg by mouth every 6 (six) hours as needed for moderate pain.    [name] ALPRAZolam (XANAX XR) 1 MG 24 hr tablet Take 1 tablet (1 mg total) by mouth 2 (two) times daily. 180 tablet 0  . azithromycin (ZITHROMAX Z-PAK) 250 MG tablet As directed 6 each 0  . Blood Glucose Monitoring Suppl (ONETOUCH VERIO IQ SYSTEM) w/Device KIT  Check blood sugar twice daily. Dx:E11.9 1 kit 0  . buPROPion (WELLBUTRIN XL) 150 MG 24 hr tablet Take 1 tablet (150 mg total) by mouth daily. 90 tablet 1  . clonazePAM (KLONOPIN) 0.5 MG tablet Take 1 tablet (0.5 mg total) by mouth 2 (two) times daily. 60 tablet 2  . docusate sodium (COLACE) 100 MG capsule Take 1 capsule (100 mg total) by mouth 2 (two) times daily. 20 capsule 0  . escitalopram (LEXAPRO) 20 MG  tablet Take 1 tablet (20 mg total) by mouth every morning. 90 tablet 0  . famotidine (PEPCID) 20 MG tablet Take 1 tablet (20 mg total) by mouth daily. (Patient taking differently: Take 20 mg by mouth daily as needed for heartburn or indigestion. ) 30 tablet 0  . fluticasone (FLONASE) 50 MCG/ACT nasal spray Place 2 sprays into both nostrils daily. 16 g 6  . furosemide (LASIX) 20 MG tablet Take 1 tablet (20 mg total) by mouth daily. 90 tablet 1  . glucose blood (ONETOUCH VERIO) test strip Check blood sugar twice daily. Dx:E11.9 100 each 12  . HYDROcodone-homatropine (HYCODAN) 5-1.5 MG/5ML syrup Take 5 mLs by mouth every 8 (eight) hours as needed for cough. 120 mL 0  . letrozole (FEMARA) 2.5 MG tablet Take 1 tablet (2.5 mg total) by mouth daily. 90 tablet 4  . levothyroxine (SYNTHROID, LEVOTHROID) 150 MCG tablet Take 1 tablet (150 mcg total) by mouth daily. 90 tablet 1  . lidocaine-prilocaine (EMLA) cream Apply over port area 1-2 hours before chemotherapy 30 g 0  . metFORMIN (GLUCOPHAGE XR) 500 MG 24 hr tablet Take 1 tablet (500 mg total) by mouth daily with breakfast. 30 tablet 2  . metoprolol (LOPRESSOR) 50 MG tablet Take 1 tablet (50 mg total) by mouth 2 (two) times daily. 180 tablet 1  . metroNIDAZOLE (METROGEL) 1 % gel Apply topically daily. 45 g 0  . Nutritional Supplements (ENSURE HIGH PROTEIN PO) Take 237 mLs by mouth 2 (two) times daily.    [name] OLANZapine (ZYPREXA) 2.5 MG tablet Take 1 tablet (2.5 mg total) by mouth at bedtime. 90 tablet 1  . omeprazole (PRILOSEC) 20 MG capsule TAKE 1  CAPSULE EVERY DAY 30 capsule 0  . ondansetron (ZOFRAN) 8 MG tablet Take 1 tablet (8 mg total) by mouth 2 (two) times daily. Start the day after chemo for 2 days. Then take as needed for nausea or vomiting. 30 tablet 1  . ONETOUCH DELICA LANCETS FINE MISC Check blood sugar twice daily. Dx:E11.9 100 each 12  . oseltamivir (TAMIFLU) 75 MG capsule Take 1 capsule (75 mg total) by mouth 2 (two) times daily. 10 capsule 0  . potassium chloride (K-DUR) 10 MEQ tablet Take 1 tablet (10 mEq total) by mouth 2 (two) times daily. 180 tablet 1  . prochlorperazine (COMPAZINE) 10 MG tablet Take 1 tablet (10 mg total) by mouth every 6 (six) hours as needed (Nausea or vomiting). 30 tablet 1   No current facility-administered medications for this visit.    OBJECTIVE: Middle-aged white woman Who appears stated age 61 Vitals:   08/13/15 1221  BP: 130/83  Pulse: 69  Temp: 97.6 F (36.4 C)  Resp: 17     Body mass index is 33.21 kg/(m^2).    ECOG FS: 1 Filed Weights   08/13/15 1221  Weight: 193 lb 9.6 oz (87.816 kg)   Sclerae unicteric, EOMs intact Oropharynx clear, No thrush or other lesions No cervical or supraclavicular adenopathy Lungs no rales or rhonchi Heart regular rate and rhythm Abd soft, obese, nontender, positive bowel sounds MSK no focal spinal tenderness, no upper extremity lymphedema Neuro: nonfocal, well oriented, appropriate affect Breasts: The right breast is status post mastectomy. There is no evidence of chest wall recurrence. The right axilla is benign. The left breast is unremarkable.  Skin: Rosacea has cleared   Lab Results  Component Value Date   WBC 3.2* 08/06/2015   NEUTROABS 2.1 08/06/2015   HGB 13.1 08/06/2015   HCT 39.9  08/06/2015   MCV 85.6 08/06/2015   PLT 106* 08/06/2015     Chemistry      Component Value Date/Time   NA 138 08/06/2015 0910   NA 136 07/19/2015 0703   K 4.0 08/06/2015 0910   K 4.1 07/19/2015 0703   CL 103 07/19/2015 0703   CL 104 10/29/2012  1058   CO2 25 08/06/2015 0910   CO2 26 07/19/2015 0703   BUN 6.1* 08/06/2015 0910   BUN 6 07/19/2015 0703   CREATININE 0.8 08/06/2015 0910   CREATININE 0.63 07/19/2015 0703   CREATININE 0.69 11/10/2013 0828      Component Value Date/Time   CALCIUM 9.2 08/06/2015 0910   CALCIUM 9.1 07/19/2015 0703   ALKPHOS 159* 08/06/2015 0910   ALKPHOS 167* 07/19/2015 0703   AST 39* 08/06/2015 0910   AST 46* 07/19/2015 0703   ALT 24 08/06/2015 0910   ALT 28 07/19/2015 0703   BILITOT 1.32* 08/06/2015 0910   BILITOT 0.7 07/19/2015 0703    Results for Rhonda, [name] (MRN [id number]) as of 08/13/2015 15:38  Ref. Range 03/05/2015 08:08 03/30/2015 09:50 05/08/2015 09:16 06/25/2015 09:15 08/06/2015 09:09  CA 125 Latest Ref Range: <35 U/mL 70 (H) '29 24 26 22   ' STUDIES: CLINICAL DATA: Screening. History of treated right breast cancer, status post mastectomy in 2014. Patient also has a history of excisional biopsy for high risk lesion in the left breast in 2002.  EXAM: DIGITAL SCREENING UNILATERAL LEFT MAMMOGRAM WITH TOMO AND CAD  COMPARISON: Previous exam(s).  ACR Breast Density Category c: The breast tissue is heterogeneously dense, which may obscure small masses.  FINDINGS: The patient has had a right mastectomy. There are no findings suspicious for malignancy.  Images were processed with CAD.  IMPRESSION: No mammographic evidence of malignancy. A result letter of this screening mammogram will be mailed directly to the patient.  RECOMMENDATION: Screening mammogram in one year. (Code:SM-L-91M)  BI-RADS CATEGORY 1: Negative.   Electronically Signed  By: Fidela Salisbury M.D.  On: 06/07/2015 07:54  ASSESSMENT: 60 y.o.  BRCA negative [name]   BREAST CANCER (1)  status post right mastectomy  06/08/2012 for a pT3, pN1a, stage IIIA invasive ductal carcinoma, grade 2,  estrogen and progesterone receptor positive, HER-2/neu negative, with an MIB-1 of 20%.     (2)  treated in the adjuvant setting with docetaxel/ cyclophosphamide given every 3 weeks. There were multiple and severe complications, and the  patient tolerated only two cycles, last dose 07/29/2012  (3) letrozole started 08/16/2012  (a) osteopenia, with a T score of -1.14 April 2013  (4) postmastectomy radiation completed 12/24/2012  (5)  comorbidities include diabetes, hypertension, chronic liver disease with cirrhosis and fatty liver, and nutritional disturbance with temporarily dependence on PEG feeds (discontinued July 2014).  (6) restaging studies January 2016 show  (a) 8 mm and 4 mm Right middle lobe lung nodules, not metabolically active on PET -- will require follow-up  (b) hypermetabolic 49.6 cm mixed solid/cystic lesion in upper pelvis, with associated adenopathy  OVARIAN CANCER (7) status post exploratory laparotomy 06/27/2014 with total abdominal hysterectomy, bilateral salpingo-oophorectomy, para-aortic lymphadenectomy, omentectomy and radical tumor debulking for a clear cell ovarian cancer, pT1c pN1, stage IIIC .  (8) adjuvant chemotherapy consisting of carboplatin and paclitaxel given weekly days 1 and 8 of each 21 day cycle, for 6-8 cycles as tolerated, started 07/17/2014, with onpro support  (a) day 8 cycle 2 omitted and further treatments cancelled because of an episode of Klebsiella sepsis  requiring intensive care unit admission  (9) bilateral lower extremity DVTs documented 09/25/2014 despite being on Coumadin for PAF (subtherapeutic INR) with mobile Right common femoral clot  (a) status post thrombolysis 09/30/2014  (b) on Lovenox daily as of 10/02/2014  (c) IVC filter placed 10/26/2014 as mobile clot still noted Right groin  (d) IVC filter removed 01/30/2015 with documented resolution of the movable thrombus 01/16/2015  (10) started abraxane 10/13/2014, repeated every 14 days for 6 cycles (12 doses), completed 03/26/2015  (11) genetics testing December 04, 2014  through the Breast[facility]Ovarian Cancer Panel [facility] no deleterious mutations in ATM, BARD1, BRCA1, BRCA2, BRIP1, CDH1, CHEK2, FANCC, MLH1, MSH2, MSH6, NBN, PALB2, PMS2, PTEN, RAD51C, RAD51D, STK11, TP53, and XRCC2.    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   PLAN: Mlissa is doing well as far as both her cancers are concerned, with no evidence of cancer activity. Specifically she is 3 years out from her definitive breast cancer surgery and one year out from her ovarian cancer surgery  She is tolerating the letrozole well and the plan will be to continue that a minimum of 5 years, but possibly 10.  She would like to continue to keep her port. We will flush it in 6 weeks and then again 12 weeks from now. She will see me again in 12 weeks.  With a normal d-dimer and after 10 months of Lovenox, I feel comfortable discontinuing it at this point. We will repeat a d-dimer in approximately 6 weeks.  Otherwise she knows to call us for any problems that may develop before her next visit. Chauncey Cruel, MD    08/13/2015

## 2015-08-13 NOTE — Telephone Encounter (Signed)
appt made and avs printed °

## 2015-08-15 ENCOUNTER — Telehealth: Payer: Self-pay | Admitting: Family

## 2015-08-15 MED ORDER — SITAGLIPTIN PHOSPHATE 100 MG PO TABS: 100.0000 mg | ORAL_TABLET | Freq: Every day | ORAL | Status: DC

## 2015-08-15 NOTE — Telephone Encounter (Signed)
  I would recommend that she continue metformin once daily (lasts 24 hrs).   Add [place] once daily.  Check sugars bid x 1 week, then send me readings in mychart.

## 2015-08-15 NOTE — Telephone Encounter (Signed)
Notified pt's spouse and he voices understanding. 

## 2015-08-15 NOTE — Telephone Encounter (Signed)
Caller name: [name] Relation to pt: Husband Call back number: [phone] Pharmacy: [place], [place]  Reason for call: Pt's husband stated that pt was given metFORMIN (GLUCOPHAGE XR) 500 MG 24 hr tablet  to lower her sugar level, pt is taken meds (Metformin) and the results in the morning is usually 170 and during night time her sugar level goes up to 200 or 300. Pt would like to know if she can take metformin during night time since she takes it only during the morning. Please advise ASAP.

## 2015-08-21 ENCOUNTER — Other Ambulatory Visit: Payer: Self-pay | Admitting: Family

## 2015-08-22 ENCOUNTER — Telehealth: Payer: Self-pay

## 2015-08-22 ENCOUNTER — Other Ambulatory Visit: Payer: Self-pay | Admitting: Family

## 2015-08-22 NOTE — Telephone Encounter (Signed)
Called to follow up with patient.  She/husband say that her blood sugars have improved.  BS have been in the low 200s.  Pt is taking medications as prescribed and is doing a better job of watching what she eats.  Pt was encouraged to continue monitoring blood sugars.  If blood sugars are consistently greater than 200 mg/dl, to call office for further instructions.  They stated understanding and agreed to comply.

## 2015-08-22 NOTE — Telephone Encounter (Signed)
thx

## 2015-08-22 NOTE — Telephone Encounter (Signed)
[employer] note received via fax  Call:   Date:08/18/15 Time: 8:01"59 pm   Caller:  Paul Half (daughter) Return number:  "Please choose phone number"  Nurse: Terance Ice  Chief Complaint:  Blood Sugar High  Reason for call:  Blood sugar over 400, no symptoms that she's aware of and caller is not with patient, new phone number and caller does not know it  Disposition:  3 attempts made to reach patient.  Final attempt made 08/18/15 at 10:09:47 pm.  No message left.    Comment:  Daughter was calling, she gave me # to call her mother.  NO answer so far x 2.  She was on way to her house.

## 2015-08-30 ENCOUNTER — Telehealth: Payer: Self-pay | Admitting: Family

## 2015-08-30 NOTE — Telephone Encounter (Signed)
Caller name: [name] Relationship to patient: Husband Can be reached: [phone] Pharmacy:  [facility] [place], [place] - [place] [phone] (Phone) [phone] (Fax)         Reason for call: Husband called stating that patient received a new Glucose Monitor and does not have the strips for it. Request rx for Test Strips for Contour Next 1 monitor

## 2015-08-31 MED ORDER — GLUCOSE BLOOD VI STRP: ORAL_STRIP | Status: DC

## 2015-08-31 NOTE — Telephone Encounter (Signed)
Rx sent, left message on voicemail that request was completed and to call if any questions.

## 2015-09-24 ENCOUNTER — Other Ambulatory Visit (HOSPITAL_BASED_OUTPATIENT_CLINIC_OR_DEPARTMENT_OTHER): Payer: Medicare Other

## 2015-09-24 ENCOUNTER — Ambulatory Visit (HOSPITAL_BASED_OUTPATIENT_CLINIC_OR_DEPARTMENT_OTHER): Payer: Medicare Other

## 2015-09-24 DIAGNOSIS — Z95828 Presence of other vascular implants and grafts: Secondary | ICD-10-CM

## 2015-09-24 DIAGNOSIS — C50911 Malignant neoplasm of unspecified site of right female breast: Secondary | ICD-10-CM

## 2015-09-24 DIAGNOSIS — C569 Malignant neoplasm of unspecified ovary: Secondary | ICD-10-CM

## 2015-09-24 LAB — CBC WITH DIFFERENTIAL/PLATELET
BASO%: 0.4 % (ref 0.0–2.0)
Basophils Absolute: 0 10*3/uL (ref 0.0–0.1)
EOS ABS: 0.1 10*3/uL (ref 0.0–0.5)
EOS%: 1.9 % (ref 0.0–7.0)
HEMATOCRIT: 39.2 % (ref 34.8–46.6)
HEMOGLOBIN: 12.9 g/dL (ref 11.6–15.9)
LYMPH#: 0.7 10*3/uL — AB (ref 0.9–3.3)
LYMPH%: 20.2 % (ref 14.0–49.7)
MCH: 29.1 pg (ref 25.1–34.0)
MCHC: 33 g/dL (ref 31.5–36.0)
MCV: 88.4 fL (ref 79.5–101.0)
MONO#: 0.2 10*3/uL (ref 0.1–0.9)
MONO%: 7.2 % (ref 0.0–14.0)
NEUT%: 70.3 % (ref 38.4–76.8)
NEUTROS ABS: 2.4 10*3/uL (ref 1.5–6.5)
PLATELETS: 88 10*3/uL — AB (ref 145–400)
RBC: 4.44 10*6/uL (ref 3.70–5.45)
RDW: 16.2 % — AB (ref 11.2–14.5)
WBC: 3.4 10*3/uL — AB (ref 3.9–10.3)

## 2015-09-24 LAB — COMPREHENSIVE METABOLIC PANEL
ALBUMIN: 3.2 g/dL — AB (ref 3.5–5.0)
ALK PHOS: 190 U/L — AB (ref 40–150)
ALT: 26 U/L (ref 0–55)
ANION GAP: 8 meq/L (ref 3–11)
AST: 37 U/L — ABNORMAL HIGH (ref 5–34)
BILIRUBIN TOTAL: 0.83 mg/dL (ref 0.20–1.20)
BUN: 5.7 mg/dL — ABNORMAL LOW (ref 7.0–26.0)
CALCIUM: 8.7 mg/dL (ref 8.4–10.4)
CO2: 22 mEq/L (ref 22–29)
CREATININE: 0.8 mg/dL (ref 0.6–1.1)
Chloride: 106 mEq/L (ref 98–109)
EGFR: 78 mL/min/{1.73_m2} — AB (ref >90)
Glucose: 405 mg/dl — ABNORMAL HIGH (ref 70–140)
Potassium: 3.9 mEq/L (ref 3.5–5.1)
Sodium: 137 mEq/L (ref 136–145)
TOTAL PROTEIN: 6.6 g/dL (ref 6.4–8.3)

## 2015-09-24 MED ORDER — SODIUM CHLORIDE 0.9% FLUSH
10.0000 mL | INTRAVENOUS | Status: DC | PRN
  Administered 2015-09-24: 10 mL via INTRAVENOUS
  Filled 2015-09-24: qty 10

## 2015-09-24 MED ORDER — HEPARIN SOD (PORK) LOCK FLUSH 100 UNIT/ML IV SOLN
500.0000 [IU] | Freq: Once | INTRAVENOUS | Status: AC
  Administered 2015-09-24: 500 [IU] via INTRAVENOUS
  Filled 2015-09-24: qty 5

## 2015-09-24 NOTE — Patient Instructions (Signed)

## 2015-09-25 LAB — D-DIMER, QUANTITATIVE: D-DIMER: 0.44 mg/L FEU (ref 0.00–0.49)

## 2015-10-19 ENCOUNTER — Telehealth: Payer: Self-pay | Admitting: *Deleted

## 2015-10-19 NOTE — Telephone Encounter (Signed)
Received call from pt's spouse stating Carloyn Jaeger has faxed [name] a request for medical records from 06/13/15 to present re: pt's long term disability coverage and they haven't received records yet. Advised spouse that I am unable to see that we received request but it may have gone directly to medical records dept. Attempted to reach medical records and left message on voicemail. Spoke to [employer] and they state they have not received any requests for this pt.  Mr Glaspy requests that I fax requested info to [place] him I will need pt to sign a records release. He requests that I call Dahlia Client (case manager) at [phone] Ext [phone] to request they fax request again. Attempted to reach case manager and left message for her to return my call. Pt has only been seen once for acute visit in our office this year.

## 2015-10-22 ENCOUNTER — Other Ambulatory Visit: Payer: Self-pay | Admitting: Family Medicine

## 2015-10-22 NOTE — Telephone Encounter (Signed)
[name]-- please let me know if this come through.  Thanks!  Left message on Case Manager's voicemail below that I cannot find that we received previous records request and she can fax it to my attn: [phone]. Awaiting call / fax.

## 2015-10-24 NOTE — Telephone Encounter (Signed)
Notified pt's spouse that I have left 2 message with the case manage to fax records request if information is still needed and have not received call back or fax. Advised him to follow up with case manager again and he voices understanding.

## 2015-10-26 ENCOUNTER — Ambulatory Visit (INDEPENDENT_AMBULATORY_CARE_PROVIDER_SITE_OTHER): Payer: Medicare Other | Admitting: Family Medicine

## 2015-10-26 ENCOUNTER — Encounter: Payer: Self-pay | Admitting: Internal Medicine

## 2015-10-26 ENCOUNTER — Telehealth: Payer: Self-pay | Admitting: Family

## 2015-10-26 ENCOUNTER — Ambulatory Visit (INDEPENDENT_AMBULATORY_CARE_PROVIDER_SITE_OTHER): Payer: Medicare Other | Admitting: Internal Medicine

## 2015-10-26 ENCOUNTER — Encounter: Payer: Self-pay | Admitting: Family Medicine

## 2015-10-26 VITALS — BP 120/68 | HR 73 | Ht 65.0 in | Wt 200.6 lb

## 2015-10-26 VITALS — BP 122/60 | HR 81 | Temp 97.9°F | Wt 202.2 lb

## 2015-10-26 DIAGNOSIS — R82998 Other abnormal findings in urine: Secondary | ICD-10-CM

## 2015-10-26 DIAGNOSIS — N390 Urinary tract infection, site not specified: Secondary | ICD-10-CM | POA: Diagnosis not present

## 2015-10-26 DIAGNOSIS — I10 Essential (primary) hypertension: Secondary | ICD-10-CM | POA: Diagnosis not present

## 2015-10-26 DIAGNOSIS — I5042 Chronic combined systolic (congestive) and diastolic (congestive) heart failure: Secondary | ICD-10-CM | POA: Diagnosis not present

## 2015-10-26 DIAGNOSIS — G4733 Obstructive sleep apnea (adult) (pediatric): Secondary | ICD-10-CM | POA: Diagnosis not present

## 2015-10-26 DIAGNOSIS — E1151 Type 2 diabetes mellitus with diabetic peripheral angiopathy without gangrene: Secondary | ICD-10-CM | POA: Diagnosis not present

## 2015-10-26 DIAGNOSIS — E1165 Type 2 diabetes mellitus with hyperglycemia: Secondary | ICD-10-CM

## 2015-10-26 DIAGNOSIS — R319 Hematuria, unspecified: Secondary | ICD-10-CM

## 2015-10-26 DIAGNOSIS — E785 Hyperlipidemia, unspecified: Secondary | ICD-10-CM

## 2015-10-26 DIAGNOSIS — I480 Paroxysmal atrial fibrillation: Secondary | ICD-10-CM | POA: Diagnosis not present

## 2015-10-26 DIAGNOSIS — R809 Proteinuria, unspecified: Secondary | ICD-10-CM

## 2015-10-26 DIAGNOSIS — IMO0002 Reserved for concepts with insufficient information to code with codable children: Secondary | ICD-10-CM

## 2015-10-26 MED ORDER — METFORMIN HCL ER 500 MG PO TB24: ORAL_TABLET | ORAL | Status: DC

## 2015-10-26 MED ORDER — CIPROFLOXACIN HCL 500 MG PO TABS: 500.0000 mg | ORAL_TABLET | Freq: Two times a day (BID) | ORAL | Status: DC

## 2015-10-26 NOTE — Patient Instructions (Signed)
Order- [facility]- replacement for old CPAP machine, no longer working right. Mask of choice, continue current pressure, humidifier, supplies, AirView     Dx OSA  Please call as needed

## 2015-10-26 NOTE — Assessment & Plan Note (Signed)
Not in obvious congestive failure at this visit.

## 2015-10-26 NOTE — Assessment & Plan Note (Signed)
She and her husband describe excellent compliance and control. Quality of life and of sleep are clearly improved with CPAP. She needs a new machine, mask and supplies. Plan-replacement for very old CPAP machine

## 2015-10-26 NOTE — Telephone Encounter (Signed)
Pt's husband states he had spoken with you regarding the pt's disability, pt dropped off a copy of the letter for you to review to let him know what the pt's needs to do. States Shawna Orleans informed them that the paperwork had been sent to [facility], however pt's states you informed him that we had not received the paperwork, letter was left at your desk for review.

## 2015-10-26 NOTE — Assessment & Plan Note (Signed)
Sinus rhythm by palpation at auscultation at this visit.

## 2015-10-26 NOTE — Telephone Encounter (Signed)
Release sent to release of information

## 2015-10-26 NOTE — Patient Instructions (Signed)

## 2015-10-26 NOTE — Telephone Encounter (Signed)
Pt would like to switch PCP to Dr. Etter Sjogren.

## 2015-10-26 NOTE — Progress Notes (Signed)
02/16/12- 60 yoF never smoker seeking to re-establish for OSA.  Her machine is old, doesn't sound right and but still work reliably. She has stayed with CPAP 13/ [facility] since NPSG 01/21/07 AHI 25.1, when body weight was 220 lbs. [facility] no longer checks with her. She has considered CPAP to be "like a miracle" and has used it every night. Once on the beach trip she left it at home and her husband says she snores badly. Bedtime between 9 and 10 PM, sleep latency 25 minutes, waking once or twice before up at 4:45 AM. She denies cardiopulmonary disease. Does have a history of allergic rhinitis, hypertension , DVT left leg, and some sort of liver disease requiring liver biopsy. No ENT surgery. She is married, working as a Biomedical engineer at the [employer]. Mother died of heart failure father died with renal failure, no family history of sleep apnea.  10/26/2015-60 year old female never smoker again seeking to reestablish for OSA., Complicated by allergic rhinitis, HBP, DVT left leg, hypothyroid, GERD, dCHF, PAFib/  R Breast Ca, Ovarian Ca, DM 2 CPAP 13/[facility] Prior note and sleep history reviewed Self referral; Sleep Study 2008; currently on CPAP 13through [facility]-. She has been doing well with CPAP and returns to maintain continuity. She is here with her husband. She reports she "can't live without CPAP". Had difficult time sleeping comfortably when she went to beach for a couple of days without her machine. This machine is now quite old and needs replacement. Compliance is excellent and she is very comfortable with the pressure.  ROS-see HPI Constitutional:   No-   weight loss, night sweats, fevers, chills, fatigue, lassitude. HEENT:   No-  headaches, difficulty swallowing, tooth/dental problems, sore throat,       No-  sneezing, itching, ear ache, nasal congestion, post nasal drip,  CV:  No-   chest pain, orthopnea, PND, +swelling in lower extremities,  No-anasarca,  dizziness,  palpitations Resp: +  shortness of breath with exertion or at rest.              No-   productive cough,  + non-productive cough,  No- coughing up of blood.              No-   change in color of mucus.  No- wheezing.   Skin: No-   rash or lesions. GI:  +  heartburn, indigestion, No-abdominal pain, nausea, vomiting, diarrhea,                 change in bowel habits, loss of appetite GU: No-   dysuria, change in color of urine, no urgency or frequency.  No- flank pain. MS:  No-   joint pain or swelling.  No- decreased range of motion.  No- back pain. Neuro-     nothing unusual Psych:  No- change in mood or affect. + depression or anxiety.  No memory loss.  OBJ- Physical Exam General- Alert, Oriented, Affect-appropriate, Distress- none acute, +overweight Skin- rash-none, lesions- none, excoriation- none Lymphadenopathy- none Head- atraumatic            Eyes- Gross vision intact, PERRLA, conjunctivae and secretions clear            Ears- Hearing, canals-normal            Nose- + crusting, no-Septal dev, mucus, polyps, erosion, perforation             Throat- Mallampati IV , mucosa clear , drainage- none, tonsils- atrophic Neck-  flexible , trachea midline, no stridor , thyroid nl, carotid no bruit Chest - symmetrical excursion , unlabored           Heart/CV- RRR , no murmur , no gallop  , no rub, nl s1 s2                           - JVD- none , edema- none, stasis changes- none, varices- none           Lung- clear to P&A, wheeze- none, cough- none , dullness-none, rub- none           Chest wall-  Abd-  Br/ Gen/ Rectal- Not done, not indicated Extrem- cyanosis- none, clubbing, none, atrophy- none, strength- nl Neuro- grossly intact to observation

## 2015-10-26 NOTE — Telephone Encounter (Signed)
Ok with me 

## 2015-10-27 LAB — MICROALBUMIN / CREATININE URINE RATIO
CREATININE, URINE: 103 mg/dL (ref 20–320)
MICROALB UR: 3.7 mg/dL — AB
Microalb Creat Ratio: 36 mcg/mg creat — ABNORMAL HIGH (ref <30)

## 2015-10-27 NOTE — Assessment & Plan Note (Signed)
Increase to glucophage to 2 a day con't [place] Check labs

## 2015-10-27 NOTE — Progress Notes (Signed)
Patient ID: [name], female    DOB: Sep 16, 1955  Age: 60 y.o. MRN: [id number]    Subjective:  Subjective HPI MYCALA ORFIELD presents for f/u dm, hyperlipidemia, htn, thyroid, chf.  No complaints.     HPI HYPERTENSION  Blood pressure range-stable  Chest pain- no      Dyspnea- no Lightheadedness- no   Edema- no Other side effects - no   Medication compliance: good Low salt diet- yes  DIABETES  Blood Sugar ranges-  Polyuria- no New Visual problems- no Hypoglycemic symptoms- no Other side effects-no Medication compliance - good Last eye exam- due Foot exam- today  HYPERLIPIDEMIA  Medication compliance- no RUQ pain- no  Muscle aches- no Other side effects-no  ROS See HPI above   PMH Smoking Status noted      Review of Systems  Constitutional: Negative for diaphoresis, appetite change, fatigue and unexpected weight change.  Eyes: Negative for pain, redness and visual disturbance.  Respiratory: Negative for cough, chest tightness, shortness of breath and wheezing.   Cardiovascular: Negative for chest pain, palpitations and leg swelling.  Endocrine: Negative for cold intolerance, heat intolerance, polydipsia, polyphagia and polyuria.  Genitourinary: Negative for dysuria, frequency and difficulty urinating.  Neurological: Negative for dizziness, light-headedness, numbness and headaches.    History Past Medical History  Diagnosis Date  . Depression   . DVT (deep venous thrombosis) (HCC)     hx of on HRT left leg ~2006  . GERD (gastroesophageal reflux disease)   . Hyperlipidemia   . Hypertension   . Hypothyroidism   . PPD positive, treated     rx inh   . Diabetes mellitus   . Liver disease, chronic, with cirrhosis ([place])     ? autoimmune  . DJD (degenerative joint disease) of lumbar spine   . Chronic combined systolic and diastolic CHF (congestive heart failure) ([place])     a. 08/2012 Echo: EF 55-60%, no rwma, mod MR;  b. 07/2014 Echo: EF 45-50%, distal  antsept HK, mod TR/MR, mildly bil-atrial enlargement.  . Paroxysmal atrial fibrillation (HCC)     a. CHA2DS2VASc = 4-->coumadin.  [name] Physical deconditioning   . Allergy   . Anemia     low iron hx  . Cataract     removed ou  . OSA on CPAP     cpap 4.5 setting  . Breast cancer ([place]) 2014    a. 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 Clear Cell Ovarian Cancer     a. 06/2014 s/p TAH/BSO debulking/lymph node dissection;  b. Now on chemo.    She has past surgical history that includes Tubal ligation; Cholecystectomy; Foot surgery; Eye surgery; Cataract extraction; Percutaneous liver biopsy; Breast biopsy; Mastectomy modified radical (06/08/2012); Portacath placement (06/08/2012); PEG tube placement; history of chemotherapy x 2 treatments, radiation tx (2014); pac removed (2015); laparotomy (N/A, 06/27/2014); Abdominal hysterectomy (N/A, 06/27/2014); and Salpingoophorectomy (Bilateral, 06/27/2014).   Her family history includes Arthritis in her mother; Colon cancer in her paternal grandfather; Dementia in her father; Diabetes in her mother and paternal grandmother; Heart disease in her father and mother; Heart failure in her mother; Hypertension in her mother; Other in her daughter, daughter, and mother; Stroke in her father.She reports that she has quit smoking. She has never used smokeless tobacco. She reports that she does not drink alcohol or use illicit drugs.  Current Outpatient Prescriptions on File Prior  to Visit  Medication Sig Dispense Refill  . acetaminophen (TYLENOL) 500 MG tablet Take 500 mg by mouth every 6 (six) hours as needed for moderate pain.    [name] ALPRAZolam (XANAX XR) 1 MG 24 hr tablet Take 1 tablet (1 mg total) by mouth 2 (two) times daily. 180 tablet 0  . buPROPion (WELLBUTRIN XL) 150 MG 24 hr tablet TAKE 1 TABLET BY MOUTH EVERY DAY 90 tablet 1  . clonazePAM (KLONOPIN) 0.5  MG tablet Take 1 tablet (0.5 mg total) by mouth 2 (two) times daily. 60 tablet 2  . docusate sodium (COLACE) 100 MG capsule Take 1 capsule (100 mg total) by mouth 2 (two) times daily. 20 capsule 0  . escitalopram (LEXAPRO) 20 MG tablet Take 1 tablet (20 mg total) by mouth every morning. 90 tablet 0  . famotidine (PEPCID) 20 MG tablet Take 1 tablet (20 mg total) by mouth daily. (Patient taking differently: Take 20 mg by mouth daily as needed for heartburn or indigestion. ) 30 tablet 0  . fluticasone (FLONASE) 50 MCG/ACT nasal spray Place 2 sprays into both nostrils daily. 16 g 6  . furosemide (LASIX) 20 MG tablet Take 1 tablet (20 mg total) by mouth daily. 90 tablet 1  . glucose blood (BAYER CONTOUR NEXT TEST) test strip Use as instructed to check pt's blood sugar twice a day.  DX E11.9 100 each 5  . HYDROcodone-homatropine (HYCODAN) 5-1.5 MG/5ML syrup Take 5 mLs by mouth every 8 (eight) hours as needed for cough. 120 mL 0  . letrozole (FEMARA) 2.5 MG tablet Take 1 tablet (2.5 mg total) by mouth daily. 90 tablet 4  . levothyroxine (SYNTHROID, LEVOTHROID) 150 MCG tablet Take 1 tablet (150 mcg total) by mouth daily. 90 tablet 1  . lidocaine-prilocaine (EMLA) cream Apply over port area 1-2 hours before chemotherapy 30 g 0  . metoprolol (LOPRESSOR) 50 MG tablet Take 1 tablet (50 mg total) by mouth 2 (two) times daily. 180 tablet 0  . metroNIDAZOLE (METROGEL) 1 % gel Apply topically daily. 45 g 0  . OLANZapine (ZYPREXA) 2.5 MG tablet Take 1 tablet (2.5 mg total) by mouth at bedtime. 90 tablet 0  . omeprazole (PRILOSEC) 20 MG capsule TAKE 1 CAPSULE EVERY DAY 30 capsule 0  . ondansetron (ZOFRAN) 8 MG tablet Take 1 tablet (8 mg total) by mouth 2 (two) times daily. Start the day after chemo for 2 days. Then take as needed for nausea or vomiting. 30 tablet 1  . ONETOUCH DELICA LANCETS FINE MISC Check blood sugar twice daily. Dx:E11.9 100 each 12  . potassium chloride (K-DUR,KLOR-CON) 10 MEQ tablet Take 1  tablet (10 mEq total) by mouth 2 (two) times daily. 180 tablet 0  . sitaGLIPtin (JANUVIA) 100 MG tablet Take 1 tablet (100 mg total) by mouth daily. 30 tablet 3   No current facility-administered medications on file prior to visit.     Objective:  Objective Physical Exam  Constitutional: She is oriented to person, place, and time. She appears well-developed and well-nourished.  HENT:  Head: Normocephalic and atraumatic.  Eyes: Conjunctivae and EOM are normal.  Neck: Normal range of motion. Neck supple. No JVD present. Carotid bruit is not present. No thyromegaly present.  Cardiovascular: Normal rate, regular rhythm and normal heart sounds.   No murmur heard. Pulmonary/Chest: Effort normal and breath sounds normal. No respiratory distress. She has no wheezes. She has no rales. She exhibits no tenderness.  Musculoskeletal: She exhibits no edema.  Neurological: She is  alert and oriented to person, place, and time.  Psychiatric: She has a normal mood and affect. Her behavior is normal. Judgment and thought content normal.  Nursing note and vitals reviewed.  BP 122/60 mmHg  Pulse 81  Temp(Src) 97.9 F (36.6 C) (Oral)  Wt 202 lb 3.2 oz (91.717 kg)  SpO2 95% Wt Readings from Last 3 Encounters:  10/26/15 202 lb 3.2 oz (91.717 kg)  10/26/15 200 lb 9.6 oz (90.992 kg)  08/13/15 193 lb 9.6 oz (87.816 kg)     Lab Results  Component Value Date   WBC 3.4* 09/24/2015   HGB 12.9 09/24/2015   HCT 39.2 09/24/2015   PLT 88* 09/24/2015   GLUCOSE 405* 09/24/2015   CHOL 133 09/15/2014   TRIG 116 09/15/2014   HDL 18* 09/15/2014   LDLDIRECT 160.2 09/23/2010   LDLCALC 92 09/15/2014   ALT 26 09/24/2015   AST 37* 09/24/2015   NA 137 09/24/2015   K 3.9 09/24/2015   CL 103 07/19/2015   CREATININE 0.8 09/24/2015   BUN 5.7* 09/24/2015   CO2 22 09/24/2015   TSH 1.85 11/14/2014   INR 1.29 02/21/2015   HGBA1C 10.4* 07/19/2015   MICROALBUR 3.7* 10/26/2015    Mm Screening Breast Tomo Uni  L  06/07/2015  CLINICAL DATA:  Screening. History of treated right breast cancer, status post mastectomy in 2014. Patient also has a history of excisional biopsy for high risk lesion in the left breast in 2002. EXAM: DIGITAL SCREENING UNILATERAL LEFT MAMMOGRAM WITH TOMO AND CAD COMPARISON:  Previous exam(s). ACR Breast Density Category c: The breast tissue is heterogeneously dense, which may obscure small masses. FINDINGS: The patient has had a right mastectomy. There are no findings suspicious for malignancy. Images were processed with CAD. IMPRESSION: No mammographic evidence of malignancy. A result letter of this screening mammogram will be mailed directly to the patient. RECOMMENDATION: Screening mammogram in one year.  (Code:SM-L-28M) BI-RADS CATEGORY  1: Negative. Electronically Signed   By: Fidela Salisbury M.D.   On: 06/07/2015 07:54     Assessment & Plan:  Plan I have discontinued Ms. Zangara's metFORMIN. I am also having her start on metFORMIN and ciprofloxacin. Additionally, I am having her maintain her famotidine, docusate sodium, ondansetron, acetaminophen, levothyroxine, letrozole, furosemide, ALPRAZolam, clonazePAM, omeprazole, lidocaine-prilocaine, metroNIDAZOLE, HYDROcodone-homatropine, fluticasone, escitalopram, ONETOUCH DELICA LANCETS FINE, sitaGLIPtin, potassium chloride, OLANZapine, metoprolol, buPROPion, and glucose blood.  Meds ordered this encounter  Medications  . metFORMIN (GLUCOPHAGE XR) 500 MG 24 hr tablet    Sig: 2 po q pm    Dispense:  60 tablet    Refill:  2  . ciprofloxacin (CIPRO) 500 MG tablet    Sig: Take 1 tablet (500 mg total) by mouth 2 (two) times daily.    Dispense:  10 tablet    Refill:  0    Problem List Items Addressed This Visit    Hypertension   Relevant Orders   Microalbumin / creatinine urine ratio (Completed)    Other Visit Diagnoses    DM (diabetes mellitus) type II uncontrolled, periph vascular disorder ([place])    -  Primary    Relevant  Medications    metFORMIN (GLUCOPHAGE XR) 500 MG 24 hr tablet    Other Relevant Orders    POCT urinalysis dipstick    Lipid panel    CBC with Differential/Platelet    Hemoglobin A1c    Comprehensive metabolic panel    Microalbumin / creatinine urine ratio (Completed)    Hyperlipidemia LDL  goal <70        Relevant Orders    POCT urinalysis dipstick    Lipid panel    CBC with Differential/Platelet    Hemoglobin A1c    Comprehensive metabolic panel    Microalbumin / creatinine urine ratio (Completed)    Urinary tract infection, site not specified        Relevant Medications    ciprofloxacin (CIPRO) 500 MG tablet    Other Relevant Orders    Urine culture    Hematuria        Relevant Orders    Urine culture    Leukocytes in urine        Relevant Orders    Urine culture       Follow-up: Return in about 3 months (around 01/26/2016), or if symptoms worsen or fail to improve, for hypertension, hyperlipidemia, diabetes II.  Ann Held, DO

## 2015-10-28 LAB — URINE CULTURE

## 2015-10-28 NOTE — Telephone Encounter (Signed)
[place] with me.   Ebony, can I please see the paperwork?

## 2015-10-29 ENCOUNTER — Other Ambulatory Visit (INDEPENDENT_AMBULATORY_CARE_PROVIDER_SITE_OTHER): Payer: Medicare Other

## 2015-10-29 DIAGNOSIS — E1151 Type 2 diabetes mellitus with diabetic peripheral angiopathy without gangrene: Secondary | ICD-10-CM | POA: Diagnosis not present

## 2015-10-29 DIAGNOSIS — IMO0002 Reserved for concepts with insufficient information to code with codable children: Secondary | ICD-10-CM

## 2015-10-29 DIAGNOSIS — E1165 Type 2 diabetes mellitus with hyperglycemia: Secondary | ICD-10-CM | POA: Diagnosis not present

## 2015-10-29 DIAGNOSIS — E785 Hyperlipidemia, unspecified: Secondary | ICD-10-CM | POA: Diagnosis not present

## 2015-10-29 LAB — CBC WITH DIFFERENTIAL/PLATELET
Basophils Absolute: 0 10*3/uL (ref 0.0–0.1)
Basophils Relative: 0.4 % (ref 0.0–3.0)
EOS ABS: 0.1 10*3/uL (ref 0.0–0.7)
EOS PCT: 1.8 % (ref 0.0–5.0)
HEMATOCRIT: 39.1 % (ref 36.0–46.0)
HEMOGLOBIN: 13.3 g/dL (ref 12.0–15.0)
LYMPHS PCT: 21.3 % (ref 12.0–46.0)
Lymphs Abs: 0.7 10*3/uL (ref 0.7–4.0)
MCHC: 34 g/dL (ref 30.0–36.0)
MCV: 89.1 fl (ref 78.0–100.0)
MONO ABS: 0.2 10*3/uL (ref 0.1–1.0)
Monocytes Relative: 5.7 % (ref 3.0–12.0)
Neutro Abs: 2.4 10*3/uL (ref 1.4–7.7)
Neutrophils Relative %: 70.8 % (ref 43.0–77.0)
Platelets: 93 10*3/uL — ABNORMAL LOW (ref 150.0–400.0)
RBC: 4.39 Mil/uL (ref 3.87–5.11)
RDW: 15 % (ref 11.5–15.5)
WBC: 3.4 10*3/uL — AB (ref 4.0–10.5)

## 2015-10-29 LAB — COMPREHENSIVE METABOLIC PANEL
ALK PHOS: 166 U/L — AB (ref 39–117)
ALT: 22 U/L (ref 0–35)
AST: 39 U/L — ABNORMAL HIGH (ref 0–37)
Albumin: 3.4 g/dL — ABNORMAL LOW (ref 3.5–5.2)
BILIRUBIN TOTAL: 1 mg/dL (ref 0.2–1.2)
BUN: 5 mg/dL — AB (ref 6–23)
CO2: 20 mEq/L (ref 19–32)
CREATININE: 0.56 mg/dL (ref 0.40–1.20)
Calcium: 8.9 mg/dL (ref 8.4–10.5)
Chloride: 105 mEq/L (ref 96–112)
GFR: 116.72 mL/min (ref >60.00)
GLUCOSE: 259 mg/dL — AB (ref 70–99)
Potassium: 3.8 mEq/L (ref 3.5–5.1)
SODIUM: 138 meq/L (ref 135–145)
TOTAL PROTEIN: 6.6 g/dL (ref 6.0–8.3)

## 2015-10-29 LAB — LIPID PANEL
Cholesterol: 178 mg/dL (ref 0–200)
HDL: 32.9 mg/dL — ABNORMAL LOW (ref >39.00)
LDL Cholesterol: 122 mg/dL — ABNORMAL HIGH (ref 0–99)
NONHDL: 145.04
Total CHOL/HDL Ratio: 5
Triglycerides: 115 mg/dL (ref 0.0–149.0)
VLDL: 23 mg/dL (ref 0.0–40.0)

## 2015-10-29 LAB — HEMOGLOBIN A1C: HEMOGLOBIN A1C: 9.9 % — AB (ref 4.6–6.5)

## 2015-10-29 NOTE — Telephone Encounter (Signed)
Ebony already sent copy to Health information management to process release.

## 2015-10-29 NOTE — Telephone Encounter (Signed)
I am just seeing document now.  I reviewed the document, they are requesting copies of her office visits 2/1- present.  Please print copies and leave at front desk for patient to pick up and submit to [place] prior to 11/10/15.  Just double check with Charlena Cross to make sure she has not already done this. Thanks.

## 2015-10-29 NOTE — Telephone Encounter (Signed)
[name]-- A copy of record request has been placed in your yellow folder.

## 2015-10-30 ENCOUNTER — Telehealth: Payer: Self-pay | Admitting: Internal Medicine

## 2015-10-30 DIAGNOSIS — G4733 Obstructive sleep apnea (adult) (pediatric): Secondary | ICD-10-CM

## 2015-10-30 NOTE — Telephone Encounter (Signed)
Spoke with Corene Cornea at [facility]  He states that he received order for new CPAP, but it is missing the CPAP pressure  I have put in a new order including the setting  Nothing further needed

## 2015-11-02 ENCOUNTER — Other Ambulatory Visit: Payer: Self-pay | Admitting: Family Medicine

## 2015-11-02 ENCOUNTER — Other Ambulatory Visit: Payer: Self-pay | Admitting: Family

## 2015-11-02 NOTE — Telephone Encounter (Signed)
Medication filled to pharmacy as requested.   

## 2015-11-02 NOTE — Telephone Encounter (Addendum)
UTI 10/26/15 treated with Cipro for 3 days. Please advise    KP

## 2015-11-04 NOTE — Telephone Encounter (Signed)
If pt still having symptoms ok to fill

## 2015-11-05 ENCOUNTER — Ambulatory Visit (HOSPITAL_BASED_OUTPATIENT_CLINIC_OR_DEPARTMENT_OTHER): Payer: Medicare Other

## 2015-11-05 ENCOUNTER — Telehealth: Payer: Self-pay | Admitting: Oncology

## 2015-11-05 ENCOUNTER — Other Ambulatory Visit (HOSPITAL_BASED_OUTPATIENT_CLINIC_OR_DEPARTMENT_OTHER): Payer: Medicare Other

## 2015-11-05 ENCOUNTER — Ambulatory Visit (HOSPITAL_BASED_OUTPATIENT_CLINIC_OR_DEPARTMENT_OTHER): Payer: Medicare Other | Admitting: Oncology

## 2015-11-05 VITALS — BP 141/63 | HR 76 | Temp 97.6°F | Resp 18 | Ht 65.0 in | Wt 200.8 lb

## 2015-11-05 DIAGNOSIS — Z95828 Presence of other vascular implants and grafts: Secondary | ICD-10-CM

## 2015-11-05 DIAGNOSIS — C50811 Malignant neoplasm of overlapping sites of right female breast: Secondary | ICD-10-CM | POA: Diagnosis not present

## 2015-11-05 DIAGNOSIS — M8580 Other specified disorders of bone density and structure, unspecified site: Secondary | ICD-10-CM

## 2015-11-05 DIAGNOSIS — C562 Malignant neoplasm of left ovary: Secondary | ICD-10-CM

## 2015-11-05 DIAGNOSIS — Z79811 Long term (current) use of aromatase inhibitors: Secondary | ICD-10-CM | POA: Diagnosis not present

## 2015-11-05 DIAGNOSIS — C569 Malignant neoplasm of unspecified ovary: Secondary | ICD-10-CM

## 2015-11-05 DIAGNOSIS — C50911 Malignant neoplasm of unspecified site of right female breast: Secondary | ICD-10-CM

## 2015-11-05 LAB — CBC WITH DIFFERENTIAL/PLATELET
BASO%: 0.2 % (ref 0.0–2.0)
Basophils Absolute: 0 10*3/uL (ref 0.0–0.1)
EOS%: 1.5 % (ref 0.0–7.0)
Eosinophils Absolute: 0.1 10*3/uL (ref 0.0–0.5)
HEMATOCRIT: 42.5 % (ref 34.8–46.6)
HGB: 13.9 g/dL (ref 11.6–15.9)
LYMPH#: 0.8 10*3/uL — AB (ref 0.9–3.3)
LYMPH%: 18.2 % (ref 14.0–49.7)
MCH: 29.5 pg (ref 25.1–34.0)
MCHC: 32.8 g/dL (ref 31.5–36.0)
MCV: 90 fL (ref 79.5–101.0)
MONO#: 0.3 10*3/uL (ref 0.1–0.9)
MONO%: 6.2 % (ref 0.0–14.0)
NEUT#: 3.2 10*3/uL (ref 1.5–6.5)
NEUT%: 73.9 % (ref 38.4–76.8)
Platelets: 99 10*3/uL — ABNORMAL LOW (ref 145–400)
RBC: 4.72 10*6/uL (ref 3.70–5.45)
RDW: 14.7 % — ABNORMAL HIGH (ref 11.2–14.5)
WBC: 4.4 10*3/uL (ref 3.9–10.3)

## 2015-11-05 LAB — COMPREHENSIVE METABOLIC PANEL
ALT: 27 U/L (ref 0–55)
AST: 42 U/L — AB (ref 5–34)
Albumin: 3.3 g/dL — ABNORMAL LOW (ref 3.5–5.0)
Alkaline Phosphatase: 189 U/L — ABNORMAL HIGH (ref 40–150)
Anion Gap: 12 mEq/L — ABNORMAL HIGH (ref 3–11)
BUN: 6.8 mg/dL — AB (ref 7.0–26.0)
CALCIUM: 9 mg/dL (ref 8.4–10.4)
CHLORIDE: 107 meq/L (ref 98–109)
CO2: 21 meq/L — AB (ref 22–29)
CREATININE: 0.8 mg/dL (ref 0.6–1.1)
EGFR: 84 mL/min/{1.73_m2} — ABNORMAL LOW (ref >90)
GLUCOSE: 248 mg/dL — AB (ref 70–140)
Potassium: 3.7 mEq/L (ref 3.5–5.1)
SODIUM: 139 meq/L (ref 136–145)
Total Bilirubin: 1.12 mg/dL (ref 0.20–1.20)
Total Protein: 7.1 g/dL (ref 6.4–8.3)

## 2015-11-05 MED ORDER — SODIUM CHLORIDE 0.9% FLUSH
10.0000 mL | INTRAVENOUS | Status: DC | PRN
  Administered 2015-11-05: 10 mL via INTRAVENOUS
  Filled 2015-11-05: qty 10

## 2015-11-05 MED ORDER — LETROZOLE 2.5 MG PO TABS: 2.5000 mg | ORAL_TABLET | Freq: Every day | ORAL | Status: DC

## 2015-11-05 MED ORDER — HEPARIN SOD (PORK) LOCK FLUSH 100 UNIT/ML IV SOLN
500.0000 [IU] | Freq: Once | INTRAVENOUS | Status: AC
  Administered 2015-11-05: 500 [IU] via INTRAVENOUS
  Filled 2015-11-05: qty 5

## 2015-11-05 NOTE — Telephone Encounter (Signed)
Records request received from [facility] from 06/13/15 to present. All OV notes and labs from our office printed and faxed to [facility] successfully at [phone]. JG//CMA

## 2015-11-05 NOTE — Progress Notes (Signed)
ID: [name]   DOB: 08-14-1955  MR#: [id number]  WUJ#:[id number]  PCP: Ann Held, DO GYN:  SU: [facility] OTHER NF:AOZHYQ [name], Shanon Ace, Roney Jaffe  CHIEF COMPLAINT:  Estrogen receptor positive Breast Cancer; Ovarian cancer; DVT  CURRENT TREATMENT: Letrozole   BREAST CANCER HISTORY: From the original consult note:  [name] noted a mass in her right breast mid December 2013, and as it did not spontaneously resolve over a couple of weeks she brought it to her primary physician's attention. She was set up for diagnostic mammography and right breast ultrasonography at the breast Center 05/10/2012. (Note that the patient's most recent prior mammography had been in October 2008). The current study showed a spiculated mass in the superior subareolar portion of the right breast measuring approximately 5 cm and associated with pleomorphic calcifications. This was firm and palpable. There was right nipple retraction and skin thickening. Ultrasound confirmed an irregularly marginated hypoechoic mass measuring 3.5 cm by ultrasound, and an abnormal appearing lower right axillary lymph node measuring 2.6 cm.  Biopsies of both the breast mass and the abnormal appearing lymph node were performed 05/21/2012. Both showed an invasive ductal carcinoma, grade 2, with similar prognostic panels (the breast mass was 100% estrogen and 73% progesterone receptor positive, with an MIB-1 of 5%; the lymph node was 100% estrogen 100% progesterone receptor positive, with an MIB-1 of 20%). Both masses were HER-2 negative.  Breast MRI obtained at [facility] 05/29/2012 confirmed a dominant mass in the retroareolar right breast measuring 4.4 cm maximally. There was a satellite nodule inferior and lateral to this mass, measuring 1.7 cm. There were no other masses in either breast. Aside from the previously biopsied lymph node there were other mildly enhancing level I right  axillary lymph nodes which did not appear pathologic. There was no other lymphadenopathy noted.   The patient's subsequent history is as detailed below.  OVARIAN CANCER HISTORY: From the 06/16/2014 summary note:  "Adeliz returns today for review of her restaging studies accompanied by her husband [name]. To summarize her recent history: She was admitted to [facility] with a diagnosis of possible pneumonia in late December 2015. Chest x-ray showed some increased density in the right upper lobe. CT scan was suggested and was obtained by Dr. Inda Castle. This showed 2 small nodules in the right middle lobe, measuring 8 and 4 mm respectively. Dr. Inda Castle then contacted [name] for further evaluation and we set [name] up for a PET scan and which was performed late January. This showed the 2 nodules in the lung not to be hypermetabolic. This of course does not prove that they are not malignant. More importantly, there was a 13.7 cm cystic/solid mass in the upper pelvis associated with adenopathy,. All of this was hypermetabolic."  The patient was evaluated by gynecologic oncology and on 06/27/2014 she underwent surgery under Dr. Denman George. This consisted of an exploratory laparotomy with hysterectomy and abdominal salpingo-oophorectomy, as well as tumor debulking and omentectomy. 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 The tumor measured 11.5 cm, focally involve the ovarian capsule on the left; the uterus and right ovary and fallopian tube were unremarkable. One para-aortic lymph node was positive out of a total of 6 lymph nodes sampled (2 left common iliac, 2 left para-aortic, and to within the omental resection).  Her subsequent history is as detailed below   INTERVAL HISTORY:  [name] returns today for follow-up of her breast and ovarian cancers accompanied by her husband [name]. She continues on letrozole, which she  tolerates well. Hot flashes and vaginal dryness are not a major issue. She never developed the arthralgias or myalgias that many patients can experience on this medication. She obtains it at a good price.  REVIEW OF SYSTEMS: Gemini has significant bilateral knee pain. She has seen orthopedics for this but they are reluctant to try steroid shots because of her diabetes problems. She is not felt to be a surgical candidate at this point. Sometimes she has nausea. She has problems with her dentures. She feels anxious and depressed. She still has numbness in the fourth and fifth digits of her left hand. Otherwise a detailed review of systems today was noncontributory  PAST MEDICAL HISTORY: Past Medical History  Diagnosis Date  . Depression   . DVT (deep venous thrombosis) (HCC)     hx of on HRT left leg ~2006  . GERD (gastroesophageal reflux disease)   . Hyperlipidemia   . Hypertension   . Hypothyroidism   . PPD positive, treated     rx inh   . Diabetes mellitus   . Liver disease, chronic, with cirrhosis ([place])     ? autoimmune  . DJD (degenerative joint disease) of lumbar spine   . Chronic combined systolic and diastolic CHF (congestive heart failure) ([place])     a. 08/2012 Echo: EF 55-60%, no rwma, mod MR;  b. 07/2014 Echo: EF 45-50%, distal antsept HK, mod TR/MR, mildly bil-atrial enlargement.  . Paroxysmal atrial fibrillation (HCC)     a. CHA2DS2VASc = 4-->coumadin.  [name] Physical deconditioning   . Allergy   . Anemia     low iron hx  . Cataract     removed ou  . OSA on CPAP     cpap 4.5 setting  . Breast cancer ([place]) 2014    a. 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 Clear Cell Ovarian Cancer     a. 06/2014 s/p TAH/BSO debulking/lymph node dissection;  b. Now on chemo.    PAST SURGICAL HISTORY: Past Surgical History  Procedure Laterality Date  . Tubal ligation    .  Cholecystectomy    . Foot surgery    . Eye surgery    . Cataract extraction    . Percutaneous liver biopsy    . Breast biopsy      left breast  . Mastectomy modified radical  06/08/2012    Procedure: MASTECTOMY MODIFIED RADICAL;  Surgeon: Edward Jolly, MD;  Location: [place];  Service: General;  Laterality: Right;  . Portacath placement  06/08/2012    Procedure: INSERTION PORT-A-CATH;  Surgeon: Edward Jolly, MD;  Location: [place];  Service: General;  Laterality: Left;  . Peg tube placement      peg removed 2015  . History of chemotherapy x 2 treatments, radiation tx  2014  . Pac removed  2015  . Laparotomy N/A 06/27/2014    Procedure: EXPLORATORY LAPAROTOMY;  Surgeon: Everitt Amber, MD;  Location: [facility];  Service: Gynecology;  Laterality: N/A;  . Abdominal hysterectomy N/A 06/27/2014    Procedure: HYSTERECTOMY ABDOMINAL TOTAL;  Surgeon: Everitt Amber, MD;  Location: [facility];  Service: Gynecology;  Laterality: N/A;  . Salpingoophorectomy Bilateral 06/27/2014    Procedure: BILATERAL SALPINGO OOPHORECTOMY/TUMOR DEBULKING/LYMPHNODE DISSECTION, OMENTECTOMY;  Surgeon: Everitt Amber, MD;  Location: [facility];  Service: Gynecology;  Laterality: Bilateral;    FAMILY HISTORY Family History  Problem Relation Age of Onset  . Diabetes Mother   . Hypertension Mother   . Arthritis Mother   . Heart disease Mother   . Heart failure Mother   . Other Mother     benign breast mass  . Stroke Father   . Heart disease Father   . Dementia Father   . Diabetes Paternal Grandmother   . Colon cancer Paternal Grandfather     dx. 70s-80s  . Other Daughter     potentially has had negative BRCA testing  . Other Daughter     hysterectomy; potentially has had negative genetic testing   the patient's father died in his 11s with a history of dementia. He had had prior strokes. The patient's mother died in her 40s, with a history of congestive heart failure. Katharyn had no brothers, one sister. There is no history of  breast or ovarian cancer in the family.  GYNECOLOGIC HISTORY: Menarche age 13, first live birth age 63, she is GX P2, menopause approximately 15 years ago, on hormone replacement until 2010.  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 Her husband [name] works for [employer]. Daughter Paul Half is a Physiological scientist and lives in [place]. Daughter Santiago Bur and her family (husband and 2 children aged 81 and 3 years) currently live with the patient. Romey is a member of a [employer].   ADVANCED DIRECTIVES: Not in place  HEALTH MAINTENANCE: (Updated October 2014) Social History  Substance Use Topics  . Smoking status: Former Smoker -- 1.00 packs/day for 1 years  . Smokeless tobacco: Never Used  . Alcohol Use: No     Colonoscopy: Never  PAP: Does not recall  Bone density: Never  Lipid panel:   Allergies  Allergen Reactions  . Olmesartan Medoxomil Cough  . Adhesive [Tape] Rash  . Tetracycline Hcl Other (See Comments)  . Venlafaxine Other (See Comments)    severe dry mouth    Current Outpatient Prescriptions  Medication Sig Dispense Refill  . acetaminophen (TYLENOL) 500 MG tablet Take 500 mg by mouth every 6 (six) hours as needed for moderate pain.    [name] ALPRAZolam (XANAX XR) 1 MG 24 hr tablet Take 1 tablet (1 mg total) by mouth 2 (two) times daily. 180 tablet 0  . buPROPion (WELLBUTRIN XL) 150 MG 24 hr tablet TAKE 1 TABLET BY MOUTH EVERY DAY 90 tablet 1  . ciprofloxacin (CIPRO) 500 MG tablet Take 1 tablet (500 mg total) by mouth 2 (two) times daily. 10 tablet 0  . clonazePAM (KLONOPIN) 0.5 MG tablet Take 1 tablet (0.5 mg total) by mouth 2 (two) times daily. 60 tablet 2  . docusate sodium (COLACE) 100 MG capsule Take 1 capsule (100 mg total) by mouth 2 (two) times daily. 20 capsule 0  . escitalopram (LEXAPRO) 20 MG tablet Take 1 tablet (20 mg total) by  mouth every morning. 90 tablet 0  . famotidine (PEPCID) 20 MG tablet Take 1 tablet (20 mg total) by mouth daily. (Patient taking differently: Take 20 mg by mouth daily as needed for heartburn or indigestion. ) 30 tablet 0  . furosemide (LASIX) 20 MG tablet Take 1 tablet (20 mg total) by mouth daily. 90 tablet 1  . glucose blood (BAYER CONTOUR NEXT TEST) test strip Use as instructed  to check pt's blood sugar twice a day.  DX E11.9 100 each 5  . JANUVIA 100 MG tablet TAKE 1 TABLET BY MOUTH DAILY 90 tablet 0  . letrozole (FEMARA) 2.5 MG tablet Take 1 tablet (2.5 mg total) by mouth daily. 90 tablet 4  . levothyroxine (SYNTHROID, LEVOTHROID) 150 MCG tablet Take 1 tablet (150 mcg total) by mouth daily. 90 tablet 1  . lidocaine-prilocaine (EMLA) cream Apply over port area 1-2 hours before chemotherapy 30 g 0  . metFORMIN (GLUCOPHAGE XR) 500 MG 24 hr tablet 2 po q pm 60 tablet 2  . metoprolol (LOPRESSOR) 50 MG tablet Take 1 tablet (50 mg total) by mouth 2 (two) times daily. 180 tablet 0  . metroNIDAZOLE (METROGEL) 1 % gel Apply topically daily. 45 g 0  . OLANZapine (ZYPREXA) 2.5 MG tablet Take 1 tablet (2.5 mg total) by mouth at bedtime. 90 tablet 0  . omeprazole (PRILOSEC) 20 MG capsule TAKE 1 CAPSULE EVERY DAY 30 capsule 0  . ondansetron (ZOFRAN) 8 MG tablet Take 1 tablet (8 mg total) by mouth 2 (two) times daily. Start the day after chemo for 2 days. Then take as needed for nausea or vomiting. 30 tablet 1  . ONETOUCH DELICA LANCETS FINE MISC Check blood sugar twice daily. Dx:E11.9 100 each 12  . potassium chloride (K-DUR,KLOR-CON) 10 MEQ tablet Take 1 tablet (10 mEq total) by mouth 2 (two) times daily. 180 tablet 0   No current facility-administered medications for this visit.   Facility-Administered Medications Ordered in Other Visits  Medication Dose Route Frequency Provider Last Rate Last Dose  . sodium chloride flush (NS) 0.9 % injection 10 mL  10 mL Intravenous PRN Chauncey Cruel, MD   10 mL  at 11/05/15 5537    OBJECTIVE: Middle-aged white woman In no acute distress  Filed Vitals:   11/05/15 0946  BP: 141/63  Pulse: 76  Temp: 97.6 F (36.4 C)  Resp: 18     Body mass index is 33.41 kg/(m^2).    ECOG FS: 1 Filed Weights   11/05/15 0946  Weight: 200 lb 12.8 oz (91.082 kg)   Sclerae unicteric, pupils round and equal Oropharynx clear and moist-- no thrush or other lesions No cervical or supraclavicular adenopathy Lungs no rales or rhonchi Heart regular rate and rhythm Abd soft, nontender, positive bowel sounds MSK no focal spinal tenderness, no upper extremity lymphedema Neuro: nonfocal, well oriented, appropriate affect Breasts: Status post right mastectomy. Breast exam deferred today.   Lab Results  Component Value Date   WBC 4.4 11/05/2015   NEUTROABS 3.2 11/05/2015   HGB 13.9 11/05/2015   HCT 42.5 11/05/2015   MCV 90.0 11/05/2015   PLT 99* 11/05/2015     Chemistry      Component Value Date/Time   NA 139 11/05/2015 0838   NA 138 10/29/2015 0822   K 3.7 11/05/2015 0838   K 3.8 10/29/2015 0822   CL 105 10/29/2015 0822   CL 104 10/29/2012 1058   CO2 21* 11/05/2015 0838   CO2 20 10/29/2015 0822   BUN 6.8* 11/05/2015 0838   BUN 5* 10/29/2015 0822   CREATININE 0.8 11/05/2015 0838   CREATININE 0.56 10/29/2015 0822   CREATININE 0.69 11/10/2013 0828      Component Value Date/Time   CALCIUM 9.0 11/05/2015 0838   CALCIUM 8.9 10/29/2015 0822   ALKPHOS 189* 11/05/2015 0838   ALKPHOS 166* 10/29/2015 0822   AST 42* 11/05/2015 0838   AST 39* 10/29/2015  0822   ALT 27 11/05/2015 0838   ALT 22 10/29/2015 0822   BILITOT 1.12 11/05/2015 0838   BILITOT 1.0 10/29/2015 0822    Results for CATHELEEN, LANGHORNE (MRN [id number]) as of 08/13/2015 15:38  Ref. Range 03/05/2015 08:08 03/30/2015 09:50 05/08/2015 09:16 06/25/2015 09:15 08/06/2015 09:09  CA 125 Latest Ref Range: <35 U/mL 70 (H) '29 24 26 22   ' STUDIES: No results found.   ASSESSMENT: 60 y.o.  BRCA negative  [name]   BREAST CANCER (1)  status post right mastectomy  06/08/2012 for a pT3, pN1a, stage IIIA invasive ductal carcinoma of overlapping sites, grade 2,  estrogen and progesterone receptor positive, HER-2/neu negative, with an MIB-1 of 20%.    (2)  treated in the adjuvant setting with docetaxel/ cyclophosphamide given every 3 weeks. There were multiple and severe complications, and the  patient tolerated only two cycles, last dose 07/29/2012  (3) letrozole started 08/16/2012  (a) osteopenia, with a T score of -1.14 April 2013  (4) postmastectomy radiation completed 12/24/2012  (5)  comorbidities include diabetes, hypertension, chronic liver disease with cirrhosis and fatty liver, and nutritional disturbance with temporarily dependence on PEG feeds (discontinued July 2014).  (6) restaging studies January 2016 show  (a) 8 mm and 4 mm Right middle lobe lung nodules, not metabolically active on PET -- will require follow-up  (b) hypermetabolic 83.2 cm mixed solid/cystic lesion in upper pelvis, with associated adenopathy  OVARIAN CANCER (7) status post exploratory laparotomy 06/27/2014 with total abdominal hysterectomy, bilateral salpingo-oophorectomy, para-aortic lymphadenectomy, omentectomy and radical tumor debulking for a left-sided clear cell ovarian cancer, pT1c pN1, stage IIIC .  (8) adjuvant chemotherapy consisting of carboplatin and paclitaxel given weekly days 1 and 8 of each 21 day cycle, for 6-8 cycles as tolerated, started 07/17/2014, with onpro support  (a) day 8 cycle 2 omitted and further treatments cancelled because of an episode of Klebsiella sepsis requiring intensive care unit admission  (9) bilateral lower extremity DVTs documented 09/25/2014 despite being on Coumadin for PAF (subtherapeutic INR) with mobile Right common femoral clot  (a) status post thrombolysis 09/30/2014  (b) on Lovenox daily as of 10/02/2014, stopped April 2017  (c) IVC filter placed  10/26/2014 as mobile clot still noted Right groin  (d) IVC filter removed 01/30/2015 with documented resolution of the movable thrombus 01/16/2015  (e) normal quantitative D-dimer 08/06/2015 and 09/24/2015  (10) started abraxane 10/13/2014, repeated every 14 days for 6 cycles (12 doses), completed 03/26/2015  (11) genetics testing December 04, 2014 through the Breast[facility]Ovarian Cancer Panel [facility] no deleterious mutations in ATM, BARD1, BRCA1, BRCA2, BRIP1, CDH1, CHEK2, FANCC, MLH1, MSH2, MSH6, NBN, PALB2, PMS2, PTEN, RAD51C, RAD51D, STK11, TP53, and XRCC2.    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   PLAN: Cherrish is very stable, with no clinical evidence of recurrence of either her breast or ovarian cancers.  Since she has kept her port, we are going to continue to obtain labs and flush the port every 6 weeks. We will check a CA 125 as well as routine labs each time.  She will continue on letrozole most likely indefinitely, but at least for 10 years. She will have a bone density before she returns to see me 3 months from now.  She knows to call for any problems that may develop before her next visit here.  Chauncey Cruel, MD    11/05/2015

## 2015-11-05 NOTE — Telephone Encounter (Signed)
appt made and avs printed °

## 2015-11-05 NOTE — Patient Instructions (Signed)

## 2015-11-06 ENCOUNTER — Ambulatory Visit: Payer: Self-pay | Admitting: Family

## 2015-11-06 ENCOUNTER — Telehealth: Payer: Self-pay | Admitting: *Deleted

## 2015-11-06 DIAGNOSIS — N390 Urinary tract infection, site not specified: Secondary | ICD-10-CM

## 2015-11-06 DIAGNOSIS — R809 Proteinuria, unspecified: Secondary | ICD-10-CM

## 2015-11-06 MED ORDER — PRAVASTATIN SODIUM 20 MG PO TABS: 20.0000 mg | ORAL_TABLET | Freq: Every day | ORAL | Status: DC

## 2015-11-06 MED ORDER — CIPROFLOXACIN HCL 500 MG PO TABS: 500.0000 mg | ORAL_TABLET | Freq: Two times a day (BID) | ORAL | Status: DC

## 2015-11-06 NOTE — Telephone Encounter (Signed)
-----   Message from Ann Held, DO sent at 11/04/2015  5:25 PM EDT ----- I believe janumet was just started.    Recheck labs in 3 months Cholesterol--- LDL goal < 70,  HDL >40,  TG < 150.  Diet and exercise will increase HDL and decrease LDL and TG.  Fish,  Fish Oil, Flaxseed oil will also help increase the HDL and decrease Triglycerides.   Recheck labs in 3 months---- lipid, cmp, hgba1 Start pravachol 20 mg #30  1 each night, 2 refills .

## 2015-11-06 NOTE — Telephone Encounter (Addendum)
Husband stated that the pt's sugars are still running high.  He said the readings have come down with the medications, but they are still high.   The husband read off the pt's recent morning  BS readings:6/17: ([id number]: (178), 6/19:(256), 6/20:(195), 6/21:(183), [id number]), 6/23: (210), 6/24:(197), 6/25:(227), 6/26:(203), 6/27:(188).  Please advise.//AB/CMA     Called and spoke with the pt's husband and informed him of the pt's recent lab results and note.  He verbalized understanding.  Pt agreed to start the Pravachol.  New prescription was sent to the pharmacy by e-script.   He stated that the pt has an appt with Dr. Etter Sjogren in 3 months on (01-28-16), so the pt can have the blood work rechecked then.   He said he spoke with the nurse this am morning and she asked if the pt was having any UTI symptoms.  He stated that the pt was not having any symptoms at that time, but he noticed this afternoon that the pt's urine does have an odor.  He stated that odor is not bad but she has one, so he would like to see if the pt needs another round of the Cipro.  Informed the husband I would check with Dr. Etter Sjogren.  He stated that he will come by on tomorrow to pick up the 24 hour urine container.  Informed him to ask for me and I will get the container for him and give him the instruction for it.  He agreed.  Refill for the Cipro was sent to the pharmacy by e-script.  New lab order for the 24 hours protein urine sent.//AB/CMA

## 2015-11-06 NOTE — Telephone Encounter (Signed)
Spoke with [name] and he stated this request was in error.     KP

## 2015-11-06 NOTE — Telephone Encounter (Signed)
-----   Message from Ann Held, DO sent at 11/02/2015  2:37 PM EDT ----- uti --- treated with cipro  If 24 hr urine has not been done--- check 24 h urine protein--- secondary to microalbumin being elevated in urine

## 2015-11-07 ENCOUNTER — Other Ambulatory Visit: Payer: Self-pay | Admitting: Oncology

## 2015-11-07 MED ORDER — PRAVASTATIN SODIUM 20 MG PO TABS: 20.0000 mg | ORAL_TABLET | Freq: Every day | ORAL | Status: DC

## 2015-11-07 MED ORDER — GLIMEPIRIDE 2 MG PO TABS: 2.0000 mg | ORAL_TABLET | Freq: Every day | ORAL | Status: DC

## 2015-11-07 NOTE — Telephone Encounter (Signed)
Add amaryl 2 mg #30  1 po qd , 2 refills

## 2015-11-07 NOTE — Telephone Encounter (Signed)
Called and spoke with the pt's husband and informed him of the note below.  He verbalized understanding.  New prescription sent to the pharmacy by e-script.//AB/CMA

## 2015-11-09 NOTE — Addendum Note (Signed)
Addended by: Peggyann Shoals on: 11/09/2015 09:17 AM   Modules accepted: Orders

## 2015-11-10 LAB — PROTEIN, URINE, 24 HOUR
PROTEIN 24H UR: 117 mg/(24.h) (ref <150)
Protein, Urine: 9 mg/dL (ref 5–24)

## 2015-11-20 ENCOUNTER — Other Ambulatory Visit: Payer: Self-pay | Admitting: Oncology

## 2015-11-20 ENCOUNTER — Other Ambulatory Visit: Payer: Self-pay | Admitting: Family

## 2015-11-22 ENCOUNTER — Other Ambulatory Visit: Payer: Self-pay | Admitting: *Deleted

## 2015-12-06 ENCOUNTER — Other Ambulatory Visit: Payer: Self-pay | Admitting: Family

## 2015-12-17 ENCOUNTER — Ambulatory Visit (HOSPITAL_BASED_OUTPATIENT_CLINIC_OR_DEPARTMENT_OTHER): Payer: Medicare Other

## 2015-12-17 ENCOUNTER — Other Ambulatory Visit (HOSPITAL_BASED_OUTPATIENT_CLINIC_OR_DEPARTMENT_OTHER): Payer: Medicare Other

## 2015-12-17 DIAGNOSIS — C562 Malignant neoplasm of left ovary: Secondary | ICD-10-CM | POA: Diagnosis not present

## 2015-12-17 DIAGNOSIS — C50811 Malignant neoplasm of overlapping sites of right female breast: Secondary | ICD-10-CM

## 2015-12-17 DIAGNOSIS — Z95828 Presence of other vascular implants and grafts: Secondary | ICD-10-CM

## 2015-12-17 DIAGNOSIS — C569 Malignant neoplasm of unspecified ovary: Secondary | ICD-10-CM

## 2015-12-17 LAB — CBC WITH DIFFERENTIAL/PLATELET
BASO%: 0.3 % (ref 0.0–2.0)
BASOS ABS: 0 10*3/uL (ref 0.0–0.1)
EOS ABS: 0.1 10*3/uL (ref 0.0–0.5)
EOS%: 2.2 % (ref 0.0–7.0)
HEMATOCRIT: 39.2 % (ref 34.8–46.6)
HGB: 13.1 g/dL (ref 11.6–15.9)
LYMPH%: 22.7 % (ref 14.0–49.7)
MCH: 30.6 pg (ref 25.1–34.0)
MCHC: 33.4 g/dL (ref 31.5–36.0)
MCV: 91.7 fL (ref 79.5–101.0)
MONO#: 0.2 10*3/uL (ref 0.1–0.9)
MONO%: 6 % (ref 0.0–14.0)
NEUT#: 2.6 10*3/uL (ref 1.5–6.5)
NEUT%: 68.8 % (ref 38.4–76.8)
Platelets: 85 10*3/uL — ABNORMAL LOW (ref 145–400)
RBC: 4.28 10*6/uL (ref 3.70–5.45)
RDW: 15.2 % — ABNORMAL HIGH (ref 11.2–14.5)
WBC: 3.7 10*3/uL — ABNORMAL LOW (ref 3.9–10.3)
lymph#: 0.8 10*3/uL — ABNORMAL LOW (ref 0.9–3.3)

## 2015-12-17 LAB — COMPREHENSIVE METABOLIC PANEL
ALT: 35 U/L (ref 0–55)
AST: 52 U/L — AB (ref 5–34)
Albumin: 3.2 g/dL — ABNORMAL LOW (ref 3.5–5.0)
Alkaline Phosphatase: 136 U/L (ref 40–150)
Anion Gap: 10 mEq/L (ref 3–11)
BUN: 8.6 mg/dL (ref 7.0–26.0)
CHLORIDE: 109 meq/L (ref 98–109)
CO2: 22 meq/L (ref 22–29)
Calcium: 9.1 mg/dL (ref 8.4–10.4)
Creatinine: 0.7 mg/dL (ref 0.6–1.1)
EGFR: >90 mL/min/{1.73_m2} (ref >90)
GLUCOSE: 257 mg/dL — AB (ref 70–140)
POTASSIUM: 3.4 meq/L — AB (ref 3.5–5.1)
SODIUM: 140 meq/L (ref 136–145)
Total Bilirubin: 1.13 mg/dL (ref 0.20–1.20)
Total Protein: 6.6 g/dL (ref 6.4–8.3)

## 2015-12-17 MED ORDER — HEPARIN SOD (PORK) LOCK FLUSH 100 UNIT/ML IV SOLN
500.0000 [IU] | Freq: Once | INTRAVENOUS | Status: AC | PRN
  Administered 2015-12-17: 500 [IU]
  Filled 2015-12-17: qty 5

## 2015-12-17 MED ORDER — SODIUM CHLORIDE 0.9 % IJ SOLN
10.0000 mL | INTRAMUSCULAR | Status: DC | PRN
  Administered 2015-12-17: 10 mL
  Filled 2015-12-17: qty 10

## 2015-12-17 NOTE — Patient Instructions (Signed)

## 2015-12-18 LAB — CA 125: Cancer Antigen (CA) 125: 25.3 U/mL (ref 0.0–38.1)

## 2015-12-27 ENCOUNTER — Telehealth: Payer: Self-pay | Admitting: Internal Medicine

## 2015-12-28 NOTE — Telephone Encounter (Signed)
Pt can be seen 03-12-16 @ 11:15am. Thanks.

## 2015-12-28 NOTE — Telephone Encounter (Signed)
Called and spoke with pts husband and he is aware of appt with CY.

## 2016-01-01 ENCOUNTER — Encounter: Payer: Self-pay | Admitting: Oncology

## 2016-01-01 NOTE — Progress Notes (Signed)
forms left in box °

## 2016-01-02 ENCOUNTER — Other Ambulatory Visit: Payer: Self-pay | Admitting: *Deleted

## 2016-01-02 ENCOUNTER — Telehealth: Payer: Self-pay | Admitting: Family Medicine

## 2016-01-02 NOTE — Telephone Encounter (Signed)
Caller name: Susie Relationship to patient: [employer] Can be reached: [phone] Pharmacy:  Reason for [facility] needs the last A1C and BP for this patient

## 2016-01-03 ENCOUNTER — Encounter: Payer: Self-pay | Admitting: Oncology

## 2016-01-03 ENCOUNTER — Other Ambulatory Visit: Payer: Self-pay | Admitting: Family

## 2016-01-03 NOTE — Telephone Encounter (Signed)
The information(A1c and BP from 10/26/15) has been given to [facility].

## 2016-01-03 NOTE — Progress Notes (Signed)
forms left in box- left for dr. Jana Hakim to sign

## 2016-01-28 ENCOUNTER — Encounter: Payer: Self-pay | Admitting: Family Medicine

## 2016-01-28 ENCOUNTER — Ambulatory Visit (INDEPENDENT_AMBULATORY_CARE_PROVIDER_SITE_OTHER): Payer: Medicare Other | Admitting: Family Medicine

## 2016-01-28 ENCOUNTER — Other Ambulatory Visit (HOSPITAL_BASED_OUTPATIENT_CLINIC_OR_DEPARTMENT_OTHER): Payer: Medicare Other

## 2016-01-28 ENCOUNTER — Ambulatory Visit (HOSPITAL_BASED_OUTPATIENT_CLINIC_OR_DEPARTMENT_OTHER): Payer: Medicare Other

## 2016-01-28 VITALS — BP 110/60 | HR 71 | Temp 98.1°F | Resp 16 | Ht 65.0 in | Wt 212.0 lb

## 2016-01-28 DIAGNOSIS — E785 Hyperlipidemia, unspecified: Secondary | ICD-10-CM | POA: Diagnosis not present

## 2016-01-28 DIAGNOSIS — C50811 Malignant neoplasm of overlapping sites of right female breast: Secondary | ICD-10-CM

## 2016-01-28 DIAGNOSIS — E1165 Type 2 diabetes mellitus with hyperglycemia: Secondary | ICD-10-CM

## 2016-01-28 DIAGNOSIS — C562 Malignant neoplasm of left ovary: Secondary | ICD-10-CM

## 2016-01-28 DIAGNOSIS — I10 Essential (primary) hypertension: Secondary | ICD-10-CM

## 2016-01-28 DIAGNOSIS — E1151 Type 2 diabetes mellitus with diabetic peripheral angiopathy without gangrene: Secondary | ICD-10-CM | POA: Diagnosis not present

## 2016-01-28 DIAGNOSIS — IMO0002 Reserved for concepts with insufficient information to code with codable children: Secondary | ICD-10-CM

## 2016-01-28 DIAGNOSIS — C569 Malignant neoplasm of unspecified ovary: Secondary | ICD-10-CM

## 2016-01-28 DIAGNOSIS — Z23 Encounter for immunization: Secondary | ICD-10-CM

## 2016-01-28 DIAGNOSIS — E1121 Type 2 diabetes mellitus with diabetic nephropathy: Secondary | ICD-10-CM

## 2016-01-28 DIAGNOSIS — Z95828 Presence of other vascular implants and grafts: Secondary | ICD-10-CM

## 2016-01-28 DIAGNOSIS — E039 Hypothyroidism, unspecified: Secondary | ICD-10-CM

## 2016-01-28 DIAGNOSIS — F319 Bipolar disorder, unspecified: Secondary | ICD-10-CM

## 2016-01-28 DIAGNOSIS — K219 Gastro-esophageal reflux disease without esophagitis: Secondary | ICD-10-CM

## 2016-01-28 LAB — CBC WITH DIFFERENTIAL/PLATELET
BASO%: 0.2 % (ref 0.0–2.0)
Basophils Absolute: 0 10*3/uL (ref 0.0–0.1)
EOS%: 1.6 % (ref 0.0–7.0)
Eosinophils Absolute: 0.1 10*3/uL (ref 0.0–0.5)
HEMATOCRIT: 39.3 % (ref 34.8–46.6)
HGB: 13.4 g/dL (ref 11.6–15.9)
LYMPH#: 1 10*3/uL (ref 0.9–3.3)
LYMPH%: 19.5 % (ref 14.0–49.7)
MCH: 31.6 pg (ref 25.1–34.0)
MCHC: 34.1 g/dL (ref 31.5–36.0)
MCV: 92.7 fL (ref 79.5–101.0)
MONO#: 0.4 10*3/uL (ref 0.1–0.9)
MONO%: 7.6 % (ref 0.0–14.0)
NEUT%: 71.1 % (ref 38.4–76.8)
NEUTROS ABS: 3.5 10*3/uL (ref 1.5–6.5)
Platelets: 90 10*3/uL — ABNORMAL LOW (ref 145–400)
RBC: 4.24 10*6/uL (ref 3.70–5.45)
RDW: 14.9 % — ABNORMAL HIGH (ref 11.2–14.5)
WBC: 4.9 10*3/uL (ref 3.9–10.3)

## 2016-01-28 LAB — COMPREHENSIVE METABOLIC PANEL
ALBUMIN: 3.2 g/dL — AB (ref 3.5–5.0)
ALK PHOS: 169 U/L — AB (ref 40–150)
ALT: 37 U/L (ref 0–55)
AST: 73 U/L — ABNORMAL HIGH (ref 5–34)
Anion Gap: 10 mEq/L (ref 3–11)
BUN: 7.9 mg/dL (ref 7.0–26.0)
CO2: 22 mEq/L (ref 22–29)
Calcium: 9.3 mg/dL (ref 8.4–10.4)
Chloride: 111 mEq/L — ABNORMAL HIGH (ref 98–109)
Creatinine: 0.6 mg/dL (ref 0.6–1.1)
GLUCOSE: 124 mg/dL (ref 70–140)
POTASSIUM: 3.8 meq/L (ref 3.5–5.1)
SODIUM: 143 meq/L (ref 136–145)
Total Bilirubin: 1.17 mg/dL (ref 0.20–1.20)
Total Protein: 7 g/dL (ref 6.4–8.3)

## 2016-01-28 MED ORDER — POTASSIUM CHLORIDE ER 10 MEQ PO TBCR: 10.0000 meq | EXTENDED_RELEASE_TABLET | Freq: Two times a day (BID) | ORAL | 2 refills | Status: DC

## 2016-01-28 MED ORDER — METFORMIN HCL ER 500 MG PO TB24: ORAL_TABLET | ORAL | 2 refills | Status: DC

## 2016-01-28 MED ORDER — OMEPRAZOLE 20 MG PO CPDR: 20.0000 mg | DELAYED_RELEASE_CAPSULE | Freq: Every day | ORAL | 1 refills | Status: DC

## 2016-01-28 MED ORDER — SODIUM CHLORIDE 0.9 % IJ SOLN
10.0000 mL | INTRAMUSCULAR | Status: DC | PRN
  Administered 2016-01-28: 10 mL
  Filled 2016-01-28: qty 10

## 2016-01-28 MED ORDER — HEPARIN SOD (PORK) LOCK FLUSH 100 UNIT/ML IV SOLN
500.0000 [IU] | Freq: Once | INTRAVENOUS | Status: AC | PRN
  Administered 2016-01-28: 500 [IU]
  Filled 2016-01-28: qty 5

## 2016-01-28 MED ORDER — SITAGLIPTIN PHOSPHATE 100 MG PO TABS: 100.0000 mg | ORAL_TABLET | Freq: Every day | ORAL | 0 refills | Status: DC

## 2016-01-28 MED ORDER — NONFORMULARY OR COMPOUNDED ITEM: 0 refills | Status: DC

## 2016-01-28 MED ORDER — PRAVASTATIN SODIUM 20 MG PO TABS: 20.0000 mg | ORAL_TABLET | Freq: Every day | ORAL | 0 refills | Status: DC

## 2016-01-28 MED ORDER — GLIMEPIRIDE 2 MG PO TABS: 2.0000 mg | ORAL_TABLET | Freq: Every day | ORAL | 0 refills | Status: DC

## 2016-01-28 MED ORDER — METOPROLOL TARTRATE 50 MG PO TABS: 50.0000 mg | ORAL_TABLET | Freq: Two times a day (BID) | ORAL | 1 refills | Status: DC

## 2016-01-28 MED ORDER — LEVOTHYROXINE SODIUM 150 MCG PO TABS: 150.0000 ug | ORAL_TABLET | Freq: Every day | ORAL | 1 refills | Status: DC

## 2016-01-28 NOTE — Assessment & Plan Note (Signed)
con't januvia/ metformin and amaryl Check labs Home readings are great--- scanned in

## 2016-01-28 NOTE — Progress Notes (Signed)
Patient ID: [name], female    DOB: 10-06-1955  Age: 60 y.o. MRN: [id number]    Subjective:  Subjective  HPI TECKLA PHENG presents for f/u dm, hyperlipidemia, htn.  HPI HYPERTENSION   Blood pressure range-not checking  Chest pain- no      Dyspnea- no Lightheadedness- no   Edema- no  Other side effects - no   Medication compliance: good Low salt diet- yes    DIABETES    Blood Sugar ranges-see scanned home readings-- doing well   Polyuria- no New Visual problems- no  Hypoglycemic symptoms- no  Other side effects-no Medication compliance - good Last eye exam- due Foot exam- today   HYPERLIPIDEMIA  Medication compliance- good  RUQ pain- no  Muscle aches- no Other side effects-no  Review of Systems  Constitutional: Negative for appetite change, diaphoresis, fatigue and unexpected weight change.  Eyes: Negative for pain, redness and visual disturbance.  Respiratory: Negative for cough, chest tightness, shortness of breath and wheezing.   Cardiovascular: Negative for chest pain, palpitations and leg swelling.  Endocrine: Negative for cold intolerance, heat intolerance, polydipsia, polyphagia and polyuria.  Genitourinary: Negative for difficulty urinating, dysuria and frequency.  Neurological: Negative for dizziness, light-headedness, numbness and headaches.    History Past Medical History:  Diagnosis Date  . Allergy   . Anemia    low iron hx  . Breast cancer ([place]) 2014   a. 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 Cataract    removed ou  . Chronic combined systolic and diastolic CHF (congestive heart failure) ([place])    a. 08/2012 Echo: EF 55-60%, no rwma, mod MR;  b. 07/2014 Echo: EF 45-50%, distal antsept HK, mod TR/MR, mildly bil-atrial enlargement.  . Clear Cell Ovarian Cancer    a. 06/2014 s/p TAH/BSO debulking/lymph node dissection;  b. Now on  chemo.  . Depression   . Diabetes mellitus   . DJD (degenerative joint disease) of lumbar spine   . DVT (deep venous thrombosis) (H[facility])    hx of on HRT left leg ~2006  . GERD (gastroesophageal reflux disease)   . Hyperlipidemia   . Hypertension   . Hypothyroidism   . Liver disease, chronic, with cirrhosis ([name])    ? autoimmune  . OSA on CPAP    cpap 4.5 setting  . Paroxysmal atrial fibrillation (HCC)    a. CHA2DS2VASc = 4-->coumadin.  [name] Physical deconditioning   . PPD positive, treated    rx inh     She has a past surgical history that includes Tubal ligation; Cholecystectomy; Foot surgery; Eye surgery; Cataract extraction; Percutaneous liver biopsy; Breast biopsy; Mastectomy modified radical (06/08/2012); Portacath placement (06/08/2012); PEG tube placement; history of chemotherapy x 2 treatments, radiation tx (2014); pac removed (2015); laparotomy (N/A, 06/27/2014); Abdominal hysterectomy (N/A, 06/27/2014); and Salpingoophorectomy (Bilateral, 06/27/2014).   Her family history includes Arthritis in her mother; Colon cancer in her paternal grandfather; Dementia in her father; Diabetes in her mother and paternal grandmother; Heart disease in her father and mother; Heart failure in her mother; Hypertension in her mother; Other in her daughter, daughter, and mother; Stroke in her father.She reports that she has quit smoking. She has a 1.00 pack-year smoking history. She has never used smokeless tobacco. She reports that she does not drink alcohol or use drugs.  Current Outpatient Prescriptions on File Prior to Visit  Medication Sig Dispense Refill  .  acetaminophen (TYLENOL) 500 MG tablet Take 500 mg by mouth every 6 (six) hours as needed for moderate pain.    [name] ALPRAZolam (XANAX XR) 1 MG 24 hr tablet TAKE 1 TABLET BY MOUTH TWICE DAILY 180 tablet 0  . buPROPion (WELLBUTRIN XL) 150 MG 24 hr tablet TAKE 1 TABLET BY MOUTH EVERY DAY 90 tablet 1  . clonazePAM (KLONOPIN) 0.5 MG tablet TAKE 1 TABLET  TWICE DAILY 60 tablet 0  . docusate sodium (COLACE) 100 MG capsule Take 1 capsule (100 mg total) by mouth 2 (two) times daily. 20 capsule 0  . escitalopram (LEXAPRO) 20 MG tablet Take 1 tablet (20 mg total) by mouth every morning. 90 tablet 3  . famotidine (PEPCID) 20 MG tablet Take 1 tablet (20 mg total) by mouth daily. (Patient taking differently: Take 20 mg by mouth daily as needed for heartburn or indigestion. ) 30 tablet 0  . furosemide (LASIX) 20 MG tablet TAKE 1 TABLET BY MOUTH DAILY 90 tablet 0  . glucose blood (BAYER CONTOUR NEXT TEST) test strip Use as instructed to check pt's blood sugar twice a day.  DX E11.9 100 each 5  . letrozole (FEMARA) 2.5 MG tablet Take 1 tablet (2.5 mg total) by mouth daily. 90 tablet 4  . lidocaine-prilocaine (EMLA) cream Apply over port area 1-2 hours before chemotherapy 30 g 0  . metroNIDAZOLE (METROGEL) 1 % gel Apply topically daily. 45 g 0  . OLANZapine (ZYPREXA) 2.5 MG tablet TAKE 1 TABLET NIGHTLY AT BEDTIME 90 tablet 1  . ondansetron (ZOFRAN) 8 MG tablet Take 1 tablet (8 mg total) by mouth 2 (two) times daily. Start the day after chemo for 2 days. Then take as needed for nausea or vomiting. 30 tablet 1  . ONETOUCH DELICA LANCETS FINE MISC Check blood sugar twice daily. Dx:E11.9 100 each 12   No current facility-administered medications on file prior to visit.      Objective:  Objective  Physical Exam  Constitutional: She is oriented to person, place, and time. She appears well-developed and well-nourished.  HENT:  Head: Normocephalic and atraumatic.  Eyes: Conjunctivae and EOM are normal.  Neck: Normal range of motion. Neck supple. No JVD present. Carotid bruit is not present. No thyromegaly present.  Cardiovascular: Normal rate, regular rhythm and normal heart sounds.   No murmur heard. Pulmonary/Chest: Effort normal and breath sounds normal. No respiratory distress. She has no wheezes. She has no rales. She exhibits no tenderness.    Musculoskeletal: She exhibits no edema.  Neurological: She is alert and oriented to person, place, and time.  Psychiatric: She has a normal mood and affect. Her behavior is normal. Judgment and thought content normal.  Sensory exam of the foot is normal, tested with the monofilament. Good pulses, no lesions or ulcers, good peripheral pulses.  BP 110/60 (BP Location: Left Arm, Patient Position: Sitting, Cuff Size: Large)   Pulse 71   Temp 98.1 F (36.7 C) (Oral)   Resp 16   Ht 5\' 5"  (1.651 m)   Wt 212 lb (96.2 kg)   SpO2 95%   BMI 35.28 kg/m  Wt Readings from Last 3 Encounters:  01/28/16 212 lb (96.2 kg)  11/05/15 200 lb 12.8 oz (91.1 kg)  10/26/15 202 lb 3.2 oz (91.7 kg)     Lab Results  Component Value Date   WBC 4.9 01/28/2016   HGB 13.4 01/28/2016   HCT 39.3 01/28/2016   PLT 90 (L) 01/28/2016   GLUCOSE 124 01/28/2016  CHOL 178 10/29/2015   TRIG 115.0 10/29/2015   HDL 32.90 (L) 10/29/2015   LDLDIRECT 160.2 09/23/2010   LDLCALC 122 (H) 10/29/2015   ALT 37 01/28/2016   AST 73 (H) 01/28/2016   NA 143 01/28/2016   K 3.8 01/28/2016   CL 105 10/29/2015   CREATININE 0.6 01/28/2016   BUN 7.9 01/28/2016   CO2 22 01/28/2016   TSH 1.85 11/14/2014   INR 1.29 02/21/2015   HGBA1C 9.9 (H) 10/29/2015   MICROALBUR 3.7 (H) 10/26/2015    Mm Screening Breast Tomo Uni L  Result Date: 06/07/2015 CLINICAL DATA:  Screening. History of treated right breast cancer, status post mastectomy in 2014. Patient also has a history of excisional biopsy for high risk lesion in the left breast in 2002. EXAM: DIGITAL SCREENING UNILATERAL LEFT MAMMOGRAM WITH TOMO AND CAD COMPARISON:  Previous exam(s). ACR Breast Density Category c: The breast tissue is heterogeneously dense, which may obscure small masses. FINDINGS: The patient has had a right mastectomy. There are no findings suspicious for malignancy. Images were processed with CAD. IMPRESSION: No mammographic evidence of malignancy. A result  letter of this screening mammogram will be mailed directly to the patient. RECOMMENDATION: Screening mammogram in one year.  (Code:SM-L-78M) BI-RADS CATEGORY  1: Negative. Electronically Signed   By: Fidela Salisbury M.D.   On: 06/07/2015 07:54     Assessment & Plan:  Plan  I have discontinued Ms. Pigeon's ciprofloxacin. I have changed her JANUVIA to sitaGLIPtin. I have also changed her potassium chloride, omeprazole, metoprolol, and levothyroxine. Additionally, I am having her start on NONFORMULARY OR COMPOUNDED ITEM. Lastly, I am having her maintain her famotidine, docusate sodium, ondansetron, acetaminophen, lidocaine-prilocaine, metroNIDAZOLE, ONETOUCH DELICA LANCETS FINE, buPROPion, glucose blood, letrozole, ALPRAZolam, clonazePAM, furosemide, OLANZapine, escitalopram, pravastatin, metFORMIN, and glimepiride.  Meds ordered this encounter  Medications  . NONFORMULARY OR COMPOUNDED ITEM    Sig: Cmp, lipid, hgba1c, tsh--- dx , diabetes II, htn, hyperlipidemia, hypothyroidism    Dispense:  1 each    Refill:  0  . pravastatin (PRAVACHOL) 20 MG tablet    Sig: Take 1 tablet (20 mg total) by mouth at bedtime.    Dispense:  90 tablet    Refill:  0    D/C PREVIOUS SCRIPTS FOR THIS MEDICATION  . potassium chloride (K-DUR) 10 MEQ tablet    Sig: Take 1 tablet (10 mEq total) by mouth 2 (two) times daily.    Dispense:  180 tablet    Refill:  2  . omeprazole (PRILOSEC) 20 MG capsule    Sig: Take 1 capsule (20 mg total) by mouth daily.    Dispense:  30 capsule    Refill:  1  . metoprolol (LOPRESSOR) 50 MG tablet    Sig: Take 1 tablet (50 mg total) by mouth 2 (two) times daily.    Dispense:  180 tablet    Refill:  1  . metFORMIN (GLUCOPHAGE XR) 500 MG 24 hr tablet    Sig: 2 po q pm    Dispense:  60 tablet    Refill:  2  . levothyroxine (SYNTHROID, LEVOTHROID) 150 MCG tablet    Sig: Take 1 tablet (150 mcg total) by mouth daily.    Dispense:  90 tablet    Refill:  1  . sitaGLIPtin (JANUVIA)  100 MG tablet    Sig: Take 1 tablet (100 mg total) by mouth daily.    Dispense:  90 tablet    Refill:  0  . glimepiride (  AMARYL) 2 MG tablet    Sig: Take 1 tablet (2 mg total) by mouth daily before breakfast.    Dispense:  90 tablet    Refill:  0    Problem List Items Addressed This Visit      Unprioritized   GERD   Relevant Medications   omeprazole (PRILOSEC) 20 MG capsule   Hypertension   Relevant Medications   NONFORMULARY OR COMPOUNDED ITEM   pravastatin (PRAVACHOL) 20 MG tablet   potassium chloride (K-DUR) 10 MEQ tablet   metoprolol (LOPRESSOR) 50 MG tablet   Other Relevant Orders   Comprehensive metabolic panel   DM (diabetes mellitus), type 2, uncontrolled, with renal complications (HCC)    con't januvia/ metformin and amaryl Check labs Home readings are great--- scanned in      Relevant Medications   pravastatin (PRAVACHOL) 20 MG tablet   metFORMIN (GLUCOPHAGE XR) 500 MG 24 hr tablet   sitaGLIPtin (JANUVIA) 100 MG tablet   glimepiride (AMARYL) 2 MG tablet    Other Visit Diagnoses    DM (diabetes mellitus) type II uncontrolled, periph vascular disorder (HCC)    -  Primary   Relevant Medications   NONFORMULARY OR COMPOUNDED ITEM   pravastatin (PRAVACHOL) 20 MG tablet   metoprolol (LOPRESSOR) 50 MG tablet   metFORMIN (GLUCOPHAGE XR) 500 MG 24 hr tablet   sitaGLIPtin (JANUVIA) 100 MG tablet   glimepiride (AMARYL) 2 MG tablet   Other Relevant Orders   Hemoglobin A1c   Comprehensive metabolic panel   Encounter for immunization       Relevant Medications   NONFORMULARY OR COMPOUNDED ITEM   Other Relevant Orders   Flu Vaccine QUAD 36+ mos IM (Completed)   Hemoglobin A1c   Lipid panel   Comprehensive metabolic panel   TSH   Hyperlipidemia LDL goal <70       Relevant Medications   NONFORMULARY OR COMPOUNDED ITEM   pravastatin (PRAVACHOL) 20 MG tablet   metoprolol (LOPRESSOR) 50 MG tablet   Other Relevant Orders   Lipid panel   Comprehensive metabolic  panel   Hypothyroidism, unspecified hypothyroidism type       Relevant Medications   NONFORMULARY OR COMPOUNDED ITEM   metoprolol (LOPRESSOR) 50 MG tablet   levothyroxine (SYNTHROID, LEVOTHROID) 150 MCG tablet   Other Relevant Orders   TSH   Bipolar I disorder (HCC)          Follow-up: Return in about 6 months (around 07/27/2016) for hypertension, hyperlipidemia, diabetes II.  Ann Held, DO

## 2016-01-28 NOTE — Progress Notes (Signed)
Pre visit review using our clinic review tool, if applicable. No additional management support is needed unless otherwise documented below in the visit note. 

## 2016-01-28 NOTE — Patient Instructions (Signed)

## 2016-01-28 NOTE — Patient Instructions (Signed)

## 2016-01-29 LAB — CA 125: CANCER ANTIGEN (CA) 125: 27.9 U/mL (ref 0.0–38.1)

## 2016-01-30 ENCOUNTER — Other Ambulatory Visit: Payer: Self-pay | Admitting: Family Medicine

## 2016-01-30 DIAGNOSIS — IMO0002 Reserved for concepts with insufficient information to code with codable children: Secondary | ICD-10-CM

## 2016-01-30 DIAGNOSIS — E1151 Type 2 diabetes mellitus with diabetic peripheral angiopathy without gangrene: Secondary | ICD-10-CM

## 2016-01-30 DIAGNOSIS — E1165 Type 2 diabetes mellitus with hyperglycemia: Principal | ICD-10-CM

## 2016-01-31 ENCOUNTER — Telehealth: Payer: Self-pay | Admitting: Family Medicine

## 2016-01-31 ENCOUNTER — Other Ambulatory Visit: Payer: Self-pay | Admitting: Family

## 2016-01-31 DIAGNOSIS — IMO0002 Reserved for concepts with insufficient information to code with codable children: Secondary | ICD-10-CM

## 2016-01-31 DIAGNOSIS — E1165 Type 2 diabetes mellitus with hyperglycemia: Principal | ICD-10-CM

## 2016-01-31 DIAGNOSIS — E1151 Type 2 diabetes mellitus with diabetic peripheral angiopathy without gangrene: Secondary | ICD-10-CM

## 2016-01-31 MED ORDER — GLIMEPIRIDE 2 MG PO TABS: 2.0000 mg | ORAL_TABLET | Freq: Every day | ORAL | 1 refills | Status: DC

## 2016-01-31 NOTE — Telephone Encounter (Signed)
Caller name: [name] Relation to pt: spouse Call back number: [phone] Pharmacy: [facility],   Reason for call: Pt is needing rx refill for glimepiride (AMARYL) 2 MG tablet. Pt's spouse mentioned already called the pharmacy and that the pharmacist was going to send a fax request for the rx to our office. Please advise.

## 2016-01-31 NOTE — Telephone Encounter (Signed)
Rx faxed.    KP 

## 2016-02-03 ENCOUNTER — Other Ambulatory Visit: Payer: Self-pay | Admitting: Family Medicine

## 2016-02-03 NOTE — Progress Notes (Signed)
ID: [name]   DOB: 08/21/1955  MR#: [id number]  NTZ#:[id number]  PCP: Ann Held, DO GYN:  SU: [facility] OTHER QP:[facility] [name], Shanon Ace, Roney Jaffe  CHIEF COMPLAINT:  Estrogen receptor positive Breast Cancer; Ovarian cancer; DVT  CURRENT TREATMENT: Letrozole   BREAST CANCER HISTORY: From the original consult note:  [name] noted a mass in her right breast mid December 2013, and as it did not spontaneously resolve over a couple of weeks she brought it to her primary physician's attention. She was set up for diagnostic mammography and right breast ultrasonography at the breast Center 05/10/2012. (Note that the patient's most recent prior mammography had been in October 2008). The current study showed a spiculated mass in the superior subareolar portion of the right breast measuring approximately 5 cm and associated with pleomorphic calcifications. This was firm and palpable. There was right nipple retraction and skin thickening. Ultrasound confirmed an irregularly marginated hypoechoic mass measuring 3.5 cm by ultrasound, and an abnormal appearing lower right axillary lymph node measuring 2.6 cm.  Biopsies of both the breast mass and the abnormal appearing lymph node were performed 05/21/2012. Both showed an invasive ductal carcinoma, grade 2, with similar prognostic panels (the breast mass was 100% estrogen and 73% progesterone receptor positive, with an MIB-1 of 5%; the lymph node was 100% estrogen 100% progesterone receptor positive, with an MIB-1 of 20%). Both masses were HER-2 negative.  Breast MRI obtained at [facility] imaging 05/29/2012 confirmed a dominant mass in the retroareolar right breast measuring 4.4 cm maximally. There was a satellite nodule inferior and lateral to this mass, measuring 1.7 cm. There were no other masses in either breast. Aside from the previously biopsied lymph node there were other mildly enhancing level I right  axillary lymph nodes which did not appear pathologic. There was no other lymphadenopathy noted.   The patient's subsequent history is as detailed below.  OVARIAN CANCER HISTORY: From the 06/16/2014 summary note:  "[name] returns today for review of her restaging studies accompanied by her husband [name]. To summarize her recent history: She was admitted to [facility] with a diagnosis of possible pneumonia in late December 2015. Chest x-ray showed some increased density in the right upper lobe. CT scan was suggested and was obtained by Dr. Inda Castle. This showed 2 small nodules in the right middle lobe, measuring 8 and 4 mm respectively. Dr. Inda Castle then contacted [name] for further evaluation and we set [name] up for a PET scan and which was performed late January. This showed the 2 nodules in the lung not to be hypermetabolic. This of course does not prove that they are not malignant. More importantly, there was a 13.7 cm cystic/solid mass in the upper pelvis associated with adenopathy,. All of this was hypermetabolic."  The patient was evaluated by gynecologic oncology and on 06/27/2014 she underwent surgery under Dr. Denman George. This consisted of an exploratory laparotomy with hysterectomy and abdominal salpingo-oophorectomy, as well as tumor debulking and omentectomy. 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 The tumor measured 11.5 cm, focally involve the ovarian capsule on the left; the uterus and right ovary and fallopian tube were unremarkable. One para-aortic lymph node was positive out of a total of 6 lymph nodes sampled (2 left common iliac, 2 left para-aortic, and to within the omental resection).  Her subsequent history is as detailed below   INTERVAL HISTORY:  [name] returns today for follow-up of her breast and ovarian cancers accompanied by her husband [name]. As far as her breast cancer is  concerned, she is on letrozole. She  tolerates that well. Hot flashes and vaginal dryness are not a major issue. She never developed the arthralgias or myalgias that many patients can experience on this medication. She obtains it at a good price.  We are following the ovarian cancer with CA-125's, and the most recent reading remains in the normal range.   REVIEW OF SYSTEMS: [name] saw Dr.Aluisio for her knee pain and he gave her some shots which have helped She feels weak sometimes. She and [name] her husband do the house work together. He drives and she rides. Overall he detailed review of systems today was stable   PAST MEDICAL HISTORY: Past Medical History:  Diagnosis Date  . Allergy   . Anemia    low iron hx  . Breast cancer (HCC) 2014   a. 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 Cataract    removed ou  . Chronic combined systolic and diastolic CHF (congestive heart failure) (HCC)    a. 08/2012 Echo: EF 55-60%, no rwma, mod MR;  b. 07/2014 Echo: EF 45-50%, distal antsept HK, mod TR/MR, mildly bil-atrial enlargement.  . Clear Cell Ovarian Cancer    a. 06/2014 s/p TAH/BSO debulking/lymph node dissection;  b. Now on chemo.  . Depression   . Diabetes mellitus   . DJD (degenerative joint disease) of lumbar spine   . DVT (deep venous thrombosis) (HCC)    hx of on HRT left leg ~2006  . GERD (gastroesophageal reflux disease)   . Hyperlipidemia   . Hypertension   . Hypothyroidism   . Liver disease, chronic, with cirrhosis (HCC)    ? autoimmune  . OSA on CPAP    cpap 4.5 setting  . Paroxysmal atrial fibrillation (HCC)    a. CHA2DS2VASc = 4-->coumadin.  [name] Physical deconditioning   . PPD positive, treated    rx inh     PAST SURGICAL HISTORY: Past Surgical History:  Procedure Laterality Date  . ABDOMINAL HYSTERECTOMY N/A 06/27/2014   Procedure: HYSTERECTOMY ABDOMINAL TOTAL;   Surgeon: Adolphus Birchwood, MD;  Location: [facility];  Service: Gynecology;  Laterality: N/A;  . BREAST BIOPSY     left breast  . CATARACT EXTRACTION    . CHOLECYSTECTOMY    . EYE SURGERY    . FOOT SURGERY    . history of chemotherapy x 2 treatments, radiation tx  2014  . LAPAROTOMY N/A 06/27/2014   Procedure: EXPLORATORY LAPAROTOMY;  Surgeon: Adolphus Birchwood, MD;  Location: [facility];  Service: Gynecology;  Laterality: N/A;  . MASTECTOMY MODIFIED RADICAL  06/08/2012   Procedure: MASTECTOMY MODIFIED RADICAL;  Surgeon: Mariella Saa, MD;  Location: [facility] OR;  Service: General;  Laterality: Right;  . pac removed  2015  . PEG TUBE PLACEMENT     peg removed 2015  . PERCUTANEOUS LIVER BIOPSY    . PORTACATH PLACEMENT  06/08/2012   Procedure: INSERTION PORT-A-CATH;  Surgeon: Mariella Saa, MD;  Location: [facility] OR;  Service: General;  Laterality: Left;  . SALPINGOOPHORECTOMY Bilateral 06/27/2014   Procedure: BILATERAL SALPINGO OOPHORECTOMY/TUMOR DEBULKING/LYMPHNODE DISSECTION, OMENTECTOMY;  Surgeon: Adolphus Birchwood, MD;  Location: [facility];  Service: Gynecology;  Laterality: Bilateral;  . TUBAL LIGATION      FAMILY HISTORY Family History  Problem  Relation Age of Onset  . Diabetes Mother   . Hypertension Mother   . Arthritis Mother   . Heart disease Mother   . Heart failure Mother   . Other Mother     benign breast mass  . Stroke Father   . Heart disease Father   . Dementia Father   . Diabetes Paternal Grandmother   . Colon cancer Paternal Grandfather     dx. 70s-80s  . Other Daughter     potentially has had negative BRCA testing  . Other Daughter     hysterectomy; potentially has had negative genetic testing   the patient's father died in his 75s with a history of dementia. He had had prior strokes. The patient's mother died in her 79s, with a history of congestive heart failure. Mande had no brothers, one sister. There is no history of breast or ovarian cancer in the family.  GYNECOLOGIC  HISTORY: Menarche age 8, first live birth age 65, she is GX P2, menopause approximately 15 years ago, on hormone replacement until 2010.  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 Her husband [name] works for [employer]. Daughter Paul Half is a Physiological scientist and lives in [place]. Daughter Santiago Bur and her family (husband and 2 children aged 85 and 3 years) currently live with the patient. Chandler is a member of a [employer].   ADVANCED DIRECTIVES: Not in place  HEALTH MAINTENANCE: (Updated October 2014) Social History  Substance Use Topics  . Smoking status: Former Smoker    Packs/day: 1.00    Years: 1.00  . Smokeless tobacco: Never Used  . Alcohol use No     Colonoscopy: Never  PAP: Does not recall  Bone density: Never  Lipid panel:   Allergies  Allergen Reactions  . Olmesartan Medoxomil Cough  . Adhesive [Tape] Rash  . Tetracycline Hcl Other (See Comments)  . Venlafaxine Other (See Comments)    severe dry mouth    Current Outpatient Prescriptions  Medication Sig Dispense Refill  . acetaminophen (TYLENOL) 500 MG tablet Take 500 mg by mouth every 6 (six) hours as needed for moderate pain.    [name] ALPRAZolam (XANAX XR) 1 MG 24 hr tablet TAKE 1 TABLET BY MOUTH TWICE DAILY 180 tablet 0  . buPROPion (WELLBUTRIN XL) 150 MG 24 hr tablet TAKE 1 TABLET BY MOUTH EVERY DAY 90 tablet 1  . clonazePAM (KLONOPIN) 0.5 MG tablet TAKE 1 TABLET TWICE DAILY 60 tablet 0  . docusate sodium (COLACE) 100 MG capsule Take 1 capsule (100 mg total) by mouth 2 (two) times daily. 20 capsule 0  . escitalopram (LEXAPRO) 20 MG tablet Take 1 tablet (20 mg total) by mouth every morning. 90 tablet 3  . famotidine (PEPCID) 20 MG tablet Take 1 tablet (20 mg total) by mouth daily. (Patient taking differently: Take 20 mg by mouth daily as needed for heartburn or indigestion. ) 30  tablet 0  . furosemide (LASIX) 20 MG tablet TAKE 1 TABLET BY MOUTH DAILY 90 tablet 0  . glimepiride (AMARYL) 2 MG tablet Take 1 tablet (2 mg total) by mouth daily before breakfast. 90 tablet 1  . glucose blood (BAYER CONTOUR NEXT TEST) test strip Use as instructed to check pt's blood sugar twice a day.  DX E11.9 100 each 5  . JANUVIA 100 MG tablet TAKE 1 TABLET BY MOUTH DAILY 90 tablet 0  .  letrozole (FEMARA) 2.5 MG tablet Take 1 tablet (2.5 mg total) by mouth daily. 90 tablet 4  . levothyroxine (SYNTHROID, LEVOTHROID) 150 MCG tablet Take 1 tablet (150 mcg total) by mouth daily. 90 tablet 1  . lidocaine-prilocaine (EMLA) cream Apply over port area 1-2 hours before chemotherapy 30 g 0  . metFORMIN (GLUCOPHAGE-XR) 500 MG 24 hr tablet TAKE 2 TABLETS BY MOUTH EVERY EVENING 60 tablet 2  . metoprolol (LOPRESSOR) 50 MG tablet Take 1 tablet (50 mg total) by mouth 2 (two) times daily. 180 tablet 1  . metroNIDAZOLE (METROGEL) 1 % gel Apply topically daily. 45 g 0  . NONFORMULARY OR COMPOUNDED ITEM Cmp, lipid, hgba1c, tsh--- dx , diabetes II, htn, hyperlipidemia, hypothyroidism 1 each 0  . OLANZapine (ZYPREXA) 2.5 MG tablet TAKE 1 TABLET NIGHTLY AT BEDTIME 90 tablet 1  . omeprazole (PRILOSEC) 20 MG capsule Take 1 capsule (20 mg total) by mouth daily. 30 capsule 1  . ondansetron (ZOFRAN) 8 MG tablet Take 1 tablet (8 mg total) by mouth 2 (two) times daily. Start the day after chemo for 2 days. Then take as needed for nausea or vomiting. 30 tablet 1  . ONETOUCH DELICA LANCETS FINE MISC Check blood sugar twice daily. Dx:E11.9 100 each 12  . potassium chloride (K-DUR) 10 MEQ tablet Take 1 tablet (10 mEq total) by mouth 2 (two) times daily. 180 tablet 2  . pravastatin (PRAVACHOL) 20 MG tablet Take 1 tablet (20 mg total) by mouth at bedtime. 90 tablet 0  . sitaGLIPtin (JANUVIA) 100 MG tablet Take 1 tablet (100 mg total) by mouth daily. 90 tablet 0   No current facility-administered medications for this visit.      OBJECTIVE: Middle-aged white woman  Vitals:   02/04/16 1000  BP: 135/67  Pulse: 78  Resp: 18  Temp: 97.5 F (36.4 C)     Body mass index is 35.21 kg/m.    ECOG FS: 2 Filed Weights   02/04/16 1000  Weight: 211 lb 9.6 oz (96 kg)   Sclerae unicteric, EOMs intact Oropharynx clear and moist No cervical or supraclavicular adenopathy Lungs no rales or rhonchi Heart regular rate and rhythm Abd soft, nontender, positive bowel sounds MSK no focal spinal tenderness, no upper extremity lymphedema Neuro: nonfocal, well oriented, appropriate affect Breasts: The right breast is status post mastectomy. There is no evidence of local recurrence. The right axilla is benign. The left breast is unremarkable.    Lab Results  Component Value Date   WBC 4.9 01/28/2016   NEUTROABS 3.5 01/28/2016   HGB 13.4 01/28/2016   HCT 39.3 01/28/2016   MCV 92.7 01/28/2016   PLT 90 (L) 01/28/2016     Chemistry      Component Value Date/Time   NA 143 01/28/2016 1217   K 3.8 01/28/2016 1217   CL 105 10/29/2015 0822   CL 104 10/29/2012 1058   CO2 22 01/28/2016 1217   BUN 7.9 01/28/2016 1217   CREATININE 0.6 01/28/2016 1217      Component Value Date/Time   CALCIUM 9.3 01/28/2016 1217   ALKPHOS 169 (H) 01/28/2016 1217   AST 73 (H) 01/28/2016 1217   ALT 37 01/28/2016 1217   BILITOT 1.17 01/28/2016 1217     Results for ZAHARI, FAZZINO (MRN [id number]) as of 02/04/2016 10:09  Ref. Range 05/08/2015 09:16 06/25/2015 09:15 08/06/2015 09:09 12/17/2015 11:32 01/28/2016 12:17  Cancer Antigen (CA) 125 Latest Ref Range: 0.0 - 38.1 U/mL  23.8 23.5 25.3 27.9  STUDIES: No results found.   ASSESSMENT: 60 y.o.  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    (2)  treated in the adjuvant  setting with docetaxel/ cyclophosphamide given every 3 weeks. There were multiple and severe complications, and the  patient tolerated only two cycles, last dose 07/29/2012  (3) letrozole started 08/16/2012  (a) osteopenia, with a T score of -1.14 April 2013  (4) postmastectomy radiation completed 12/24/2012  (5)  comorbidities include diabetes, hypertension, chronic liver disease with cirrhosis and fatty liver, and nutritional disturbance with temporarily dependence on PEG feeds (discontinued July 2014).  (6) restaging studies January 2016 show  (a) 8 mm and 4 mm Right middle lobe lung nodules, not metabolically active on PET -- will require follow-up  (b) hypermetabolic 79.8 cm mixed solid/cystic lesion in upper pelvis, with associated adenopathy  OVARIAN CANCER (7) status post exploratory laparotomy 06/27/2014 with total abdominal hysterectomy, bilateral salpingo-oophorectomy, para-aortic lymphadenectomy, omentectomy and radical tumor debulking for a left-sided clear cell ovarian cancer, pT1c pN1, stage IIIC .  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 despite being on Coumadin for PAF (subtherapeutic INR) with mobile Right common femoral clot  (a) status post thrombolysis 09/30/2014  (b) on Lovenox daily as of 10/02/2014, stopped April 2017  (c) IVC filter placed 10/26/2014 as mobile clot still noted Right groin  (d) IVC filter removed 01/30/2015 with documented resolution of the movable thrombus 01/16/2015  (e) normal quantitative D-dimer 08/06/2015 and 09/24/2015  (10) started abraxane 10/13/2014, repeated every  14 days for 6 cycles (12 doses), completed 03/26/2015  (11) genetics testing December 04, 2014 through the Breast[facility]Ovarian Cancer Panel [facility] no deleterious mutations in ATM, BARD1, BRCA1, BRCA2, BRIP1, CDH1, CHEK2, FANCC, MLH1, MSH2, MSH6, NBN, PALB2, PMS2, PTEN, RAD51C, RAD51D, STK11, TP53, and XRCC2.    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   PLAN: Serenitie will soon be 4 years out from definitive surgery for her breast cancer, with no evidence of disease recurrence. This is very favorable.  She is tolerating the letrozole well, and the plan will be to continue that for a total of 5 years, with consideration of continuing to 10.  She will be one year out from her ovarian cancer surgery in February and that is when I will see her next. She will have a restaging PET scan in December and her mammography January  At this point I am delighted that she is doing so well. She knows to call for any problems that may develop before her next visit.  Chauncey Cruel, MD    02/04/2016

## 2016-02-04 ENCOUNTER — Ambulatory Visit (HOSPITAL_BASED_OUTPATIENT_CLINIC_OR_DEPARTMENT_OTHER): Payer: Medicare Other | Admitting: Oncology

## 2016-02-04 ENCOUNTER — Telehealth: Payer: Self-pay | Admitting: *Deleted

## 2016-02-04 VITALS — BP 135/67 | HR 78 | Temp 97.5°F | Resp 18 | Wt 211.6 lb

## 2016-02-04 DIAGNOSIS — C562 Malignant neoplasm of left ovary: Secondary | ICD-10-CM

## 2016-02-04 DIAGNOSIS — G620 Drug-induced polyneuropathy: Secondary | ICD-10-CM

## 2016-02-04 DIAGNOSIS — C50911 Malignant neoplasm of unspecified site of right female breast: Secondary | ICD-10-CM

## 2016-02-04 DIAGNOSIS — C50811 Malignant neoplasm of overlapping sites of right female breast: Secondary | ICD-10-CM

## 2016-02-04 DIAGNOSIS — Z79811 Long term (current) use of aromatase inhibitors: Secondary | ICD-10-CM | POA: Diagnosis not present

## 2016-02-04 MED ORDER — ALPRAZOLAM ER 1 MG PO TB24: 1.0000 mg | ORAL_TABLET | Freq: Two times a day (BID) | ORAL | 0 refills | Status: DC

## 2016-02-04 MED ORDER — CLONAZEPAM 0.5 MG PO TABS: 0.5000 mg | ORAL_TABLET | Freq: Two times a day (BID) | ORAL | 0 refills | Status: DC

## 2016-02-04 MED ORDER — LETROZOLE 2.5 MG PO TABS: 2.5000 mg | ORAL_TABLET | Freq: Every day | ORAL | 4 refills | Status: DC

## 2016-02-22 ENCOUNTER — Ambulatory Visit (INDEPENDENT_AMBULATORY_CARE_PROVIDER_SITE_OTHER): Payer: Medicare Other | Admitting: Medical

## 2016-02-22 ENCOUNTER — Other Ambulatory Visit (HOSPITAL_COMMUNITY)
Admission: RE | Admit: 2016-02-22 | Discharge: 2016-02-22 | Disposition: A | Discharge status: Home / Self Care | Payer: Medicare Other | Source: Ambulatory Visit | Attending: Medical | Admitting: Medical

## 2016-02-22 ENCOUNTER — Encounter: Payer: Self-pay | Admitting: Medical

## 2016-02-22 VITALS — BP 134/55 | HR 73 | Temp 97.7°F | Ht 65.0 in | Wt 214.4 lb

## 2016-02-22 DIAGNOSIS — R822 Biliuria: Secondary | ICD-10-CM

## 2016-02-22 DIAGNOSIS — R8299 Other abnormal findings in urine: Secondary | ICD-10-CM

## 2016-02-22 DIAGNOSIS — R300 Dysuria: Secondary | ICD-10-CM

## 2016-02-22 DIAGNOSIS — R82998 Other abnormal findings in urine: Secondary | ICD-10-CM

## 2016-02-22 DIAGNOSIS — N760 Acute vaginitis: Secondary | ICD-10-CM | POA: Insufficient documentation

## 2016-02-22 LAB — POCT URINALYSIS DIPSTICK
BILIRUBIN UA: NEGATIVE
KETONES UA: NEGATIVE
Nitrite, UA: NEGATIVE
Spec Grav, UA: >=1.03
Urobilinogen, UA: 1
pH, UA: 5.5

## 2016-02-22 MED ORDER — CIPROFLOXACIN HCL 500 MG PO TABS: 500.0000 mg | ORAL_TABLET | Freq: Two times a day (BID) | ORAL | 0 refills | Status: DC

## 2016-02-22 NOTE — Patient Instructions (Addendum)
Your appear to have a urinary tract infection. I am prescribing cipro antibiotic for the probable infection. Hydrate well. I am sending out a urine culture. During the interim if your signs and symptoms worsen rather than improving please notify us. We will notify your when the culture results are back.  Limited urine ancillary studies done  Follow up in 7 days or as needed.

## 2016-02-22 NOTE — Telephone Encounter (Signed)
Will you call pt on Monday. Reviewed urine dip and saw bilirubin in urine. So want to check cmp. Check liver enzymes. So order placed. Will you call pt on Monday morning and have her come in for stat cmp.

## 2016-02-22 NOTE — Progress Notes (Addendum)
Subjective:    Patient ID: [name], female    DOB: Feb 14, 1956, 60 y.o.   MRN: [id number]  HPI   Pt in today reporting urinary symptoms for 5 days.(Symptoms while on vacation)  Dysuria- yes Frequent urination-yes Hesitancy-yes Suprapubic pressure-no Fever-no chills-no Nausea-no Vomiting-no CVA pain- History of UTI- yes, Gross hematuria-no. But almost orange color to urine per pt.  Pt sugar this morning was 136 this morning.    Review of Systems  Constitutional: Negative for chills, fatigue and fever.  Respiratory: Negative for cough, chest tightness, shortness of breath and wheezing.   Cardiovascular: Negative for chest pain and palpitations.  Gastrointestinal: Negative for abdominal pain, blood in stool, constipation, diarrhea, nausea and vomiting.  Genitourinary: Positive for dysuria and frequency. Negative for difficulty urinating, flank pain, hematuria, pelvic pain, urgency and vaginal pain.  Musculoskeletal: Positive for back pain.       Faint cva tenderness.  Skin: Negative for rash.  Neurological: Negative for dizziness and headaches.  Hematological: Negative for adenopathy.  Psychiatric/Behavioral: Negative for behavioral problems and confusion.    Past Medical History:  Diagnosis Date  . Allergy   . Anemia    low iron hx  . Breast cancer ([place]) 2014   a. 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 Cataract    removed ou  . Chronic combined systolic and diastolic CHF (congestive heart failure) ([place])    a. 08/2012 Echo: EF 55-60%, no rwma, mod MR;  b. 07/2014 Echo: EF 45-50%, distal antsept HK, mod TR/MR, mildly bil-atrial enlargement.  . Clear Cell Ovarian Cancer    a. 06/2014 s/p TAH/BSO debulking/lymph node dissection;  b. Now on chemo.  . Depression   . Diabetes mellitus   . DJD (degenerative joint disease) of lumbar spine   . DVT (deep  venous thrombosis) ([facility])    hx of on HRT left leg ~2006  . GERD (gastroesophageal reflux disease)   . Hyperlipidemia   . Hypertension   . Hypothyroidism   . Liver disease, chronic, with cirrhosis ([place])    ? autoimmune  . OSA on CPAP    cpap 4.5 setting  . Paroxysmal atrial fibrillation ([facility])    a. CHA2DS2VASc = 4-->coumadin.  [name] Physical deconditioning   . PPD positive, treated    rx inh      Social History   Social History  . Marital status: Married    Spouse name: N/A  . Number of children: 2  . Years of education: N/A   Occupational History  . [place] History Main Topics  . Smoking status: Former Smoker    Packs/day: 1.00    Years: 1.00  . Smokeless tobacco: Never Used  . Alcohol use No  . Drug use: No     Comment: quit age 39 only smoked as a teen 1 year  . Sexual activity: No   Other Topics Concern  . 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     Past Surgical History:  Procedure Laterality Date  . ABDOMINAL HYSTERECTOMY N/A 06/27/2014   Procedure: HYSTERECTOMY ABDOMINAL TOTAL;  Surgeon: Everitt Amber, MD;  Location: [facility];  Service: Gynecology;  Laterality: N/A;  . BREAST BIOPSY     left breast  . CATARACT EXTRACTION    . CHOLECYSTECTOMY    . EYE SURGERY    . FOOT SURGERY    . history of chemotherapy x 2 treatments, radiation tx  2014  . LAPAROTOMY N/A 06/27/2014   Procedure: EXPLORATORY LAPAROTOMY;  Surgeon: Everitt Amber, MD;  Location: [facility];  Service: Gynecology;  Laterality: N/A;  . MASTECTOMY MODIFIED RADICAL  06/08/2012   Procedure: MASTECTOMY MODIFIED RADICAL;  Surgeon:  Jolly, MD;  Location: [place];  Service: General;  Laterality: Right;  . pac removed  2015  . PEG TUBE PLACEMENT     peg removed 2015  . PERCUTANEOUS LIVER BIOPSY    .  PORTACATH PLACEMENT  06/08/2012   Procedure: INSERTION PORT-A-CATH;  Surgeon:  Jolly, MD;  Location: [place];  Service: General;  Laterality: Left;  . SALPINGOOPHORECTOMY Bilateral 06/27/2014   Procedure: BILATERAL SALPINGO OOPHORECTOMY/TUMOR DEBULKING/LYMPHNODE DISSECTION, OMENTECTOMY;  Surgeon: Everitt Amber, MD;  Location: [facility];  Service: Gynecology;  Laterality: Bilateral;  . TUBAL LIGATION      Family History  Problem Relation Age of Onset  . Diabetes Mother   . Hypertension Mother   . Arthritis Mother   . Heart disease Mother   . Heart failure Mother   . Other Mother     benign breast mass  . Stroke Father   . Heart disease Father   . Dementia Father   . Diabetes Paternal Grandmother   . Colon cancer Paternal Grandfather     dx. 70s-80s  . Other Daughter     potentially has had negative BRCA testing  . Other Daughter     hysterectomy; potentially has had negative genetic testing    Allergies  Allergen Reactions  . Olmesartan Medoxomil Cough  . Adhesive [Tape] Rash  . Tetracycline Hcl Other (See Comments)  . Venlafaxine Other (See Comments)    severe dry mouth    Current Outpatient Prescriptions on File Prior to Visit  Medication Sig Dispense Refill  . acetaminophen (TYLENOL) 500 MG tablet Take 500 mg by mouth every 6 (six) hours as needed for moderate pain.    [name] ALPRAZolam (XANAX XR) 1 MG 24 hr tablet Take 1 tablet (1 mg total) by mouth 2 (two) times daily. 180 tablet 0  . buPROPion (WELLBUTRIN XL) 150 MG 24 hr tablet TAKE 1 TABLET BY MOUTH EVERY DAY 90 tablet 1  . clonazePAM (KLONOPIN) 0.5 MG tablet Take 1 tablet (0.5 mg total) by mouth 2 (two) times daily. 180 tablet 0  . docusate sodium (COLACE) 100 MG capsule Take 1 capsule (100 mg total) by mouth 2 (two) times daily. 20 capsule 0  . escitalopram (LEXAPRO) 20 MG tablet Take 1 tablet (20 mg total) by mouth every morning. 90 tablet 3  . famotidine (PEPCID) 20 MG tablet Take 1 tablet (20 mg total) by mouth  daily. (Patient taking differently: Take 20 mg by mouth daily as needed for heartburn or indigestion. ) 30 tablet 0  . furosemide (LASIX) 20 MG tablet TAKE 1 TABLET BY MOUTH DAILY 90 tablet 0  . glimepiride (AMARYL) 2 MG tablet TAKE 1 TABLET EVERY DAY BEFORE BREAKFAST 90 tablet 1  . glucose blood (BAYER CONTOUR NEXT TEST) test strip Use as instructed to check pt's blood sugar twice a day.  DX E11.9 100 each 5  . JANUVIA 100 MG tablet  TAKE 1 TABLET BY MOUTH DAILY 90 tablet 0  . letrozole (FEMARA) 2.5 MG tablet Take 1 tablet (2.5 mg total) by mouth daily. 90 tablet 4  . lidocaine-prilocaine (EMLA) cream Apply over port area 1-2 hours before chemotherapy 30 g 0  . metFORMIN (GLUCOPHAGE-XR) 500 MG 24 hr tablet TAKE 2 TABLETS BY MOUTH EVERY EVENING 60 tablet 2  . metoprolol (LOPRESSOR) 50 MG tablet Take 1 tablet (50 mg total) by mouth 2 (two) times daily. 180 tablet 1  . metroNIDAZOLE (METROGEL) 1 % gel Apply topically daily. 45 g 0  . NONFORMULARY OR COMPOUNDED ITEM Cmp, lipid, hgba1c, tsh--- dx , diabetes II, htn, hyperlipidemia, hypothyroidism 1 each 0  . OLANZapine (ZYPREXA) 2.5 MG tablet TAKE 1 TABLET NIGHTLY AT BEDTIME 90 tablet 1  . omeprazole (PRILOSEC) 20 MG capsule Take 1 capsule (20 mg total) by mouth daily. 30 capsule 1  . ondansetron (ZOFRAN) 8 MG tablet Take 1 tablet (8 mg total) by mouth 2 (two) times daily. Start the day after chemo for 2 days. Then take as needed for nausea or vomiting. 30 tablet 1  . ONETOUCH DELICA LANCETS FINE MISC Check blood sugar twice daily. Dx:E11.9 100 each 12  . potassium chloride (K-DUR) 10 MEQ tablet Take 1 tablet (10 mEq total) by mouth 2 (two) times daily. 180 tablet 2  . pravastatin (PRAVACHOL) 20 MG tablet Take 1 tablet (20 mg total) by mouth at bedtime. 90 tablet 0  . sitaGLIPtin (JANUVIA) 100 MG tablet Take 1 tablet (100 mg total) by mouth daily. 90 tablet 0  . levothyroxine (SYNTHROID, LEVOTHROID) 150 MCG tablet Take 1 tablet (150 mcg total) by mouth  daily. 90 tablet 1   No current facility-administered medications on file prior to visit.     BP (!) 134/55   Pulse 73   Temp 97.7 F (36.5 C) (Oral)   Ht '5\' 5"'  (1.651 m)   Wt 214 lb 6.4 oz (97.3 kg)   SpO2 100%   BMI 35.68 kg/m       Objective:   Physical Exam  General Appearance- Not in acute distress.  HEENT Eyes- Scleraeral/Conjuntiva-bilat- Not Yellow. Mouth & Throat- Normal.  Chest and Lung Exam Auscultation: Breath sounds:-Normal. Adventitious sounds:- No Adventitious sounds.  Cardiovascular Auscultation:Rythm - Regular. Heart Sounds -Normal heart sounds.  Abdomen Inspection:-Inspection Normal.  Palpation/Perucssion: Palpation and Percussion of the abdomen reveal- faint suprapubic Tendeness, No Rebound tenderness, No rigidity(Guarding) and No Palpable abdominal masses.  Liver:-Normal.  Spleen:- Normal.   Back - faint cva tenderness bilaterally.      Assessment & Plan:  Your appear to have a urinary tract infection. I am prescribing antibiotic for the probable infection. Hydrate well. I am sending out a urine culture. During the interim if your signs and symptoms worsen rather than improving please notify us. We will notify your when the culture results are back.  Follow up in 7 days or as needed.  Rx advisement given regarding cipro and sulfonurea.      Yeraldine Forney, Percell Miller, PA-C

## 2016-02-22 NOTE — Progress Notes (Signed)
Pre visit review using our clinic tool,if applicable. No additional management support is needed unless otherwise documented below in the visit note.  

## 2016-02-23 NOTE — Telephone Encounter (Signed)
I made a mistake. It was other pt that had bilirubin in her urine. So don't need to call this pt regarding repeating cmp. Will cancel the order for cmp.

## 2016-02-24 LAB — URINE CULTURE

## 2016-02-25 ENCOUNTER — Ambulatory Visit: Payer: Medicare Other | Admitting: Family Medicine

## 2016-02-29 ENCOUNTER — Other Ambulatory Visit: Payer: Self-pay | Admitting: Oncology

## 2016-02-29 DIAGNOSIS — Z1231 Encounter for screening mammogram for malignant neoplasm of breast: Secondary | ICD-10-CM

## 2016-02-29 LAB — URINE CYTOLOGY ANCILLARY ONLY
Bacterial vaginitis: NEGATIVE
Candida vaginitis: POSITIVE — AB

## 2016-02-29 MED ORDER — FLUCONAZOLE 150 MG PO TABS: ORAL_TABLET | ORAL | 0 refills | Status: DC

## 2016-02-29 NOTE — Telephone Encounter (Signed)
rx diflucan sent to her pharmacy

## 2016-03-04 ENCOUNTER — Telehealth: Payer: Self-pay | Admitting: Family Medicine

## 2016-03-04 NOTE — Telephone Encounter (Signed)
Caller name: [name] Relationship to patient: Husband Can be reached: [phone] Pharmacy:  Reason for call: Request call back to discuss Long term disability forms

## 2016-03-05 NOTE — Telephone Encounter (Signed)
Message left to call the office.    KP 

## 2016-03-05 NOTE — Telephone Encounter (Signed)
Husband called and he stated that his wife's disability check has been cut and he said that [employer] stated they sent [name] a form but according to her chart, there is nothing from [employer]. I advised to cal [employer] and have tem re-sent it and he voiced understanding and has agreed to do so.   KP

## 2016-03-12 ENCOUNTER — Encounter: Payer: Self-pay | Admitting: Internal Medicine

## 2016-03-12 ENCOUNTER — Ambulatory Visit (INDEPENDENT_AMBULATORY_CARE_PROVIDER_SITE_OTHER): Payer: Medicare Other | Admitting: Internal Medicine

## 2016-03-12 DIAGNOSIS — G4733 Obstructive sleep apnea (adult) (pediatric): Secondary | ICD-10-CM | POA: Diagnosis not present

## 2016-03-12 DIAGNOSIS — I482 Chronic atrial fibrillation, unspecified: Secondary | ICD-10-CM

## 2016-03-12 NOTE — Progress Notes (Signed)
02/16/12- 66 yoF never smoker seeking to re-establish for OSA.  Her machine is old, doesn't sound right and but still work reliably. She has stayed with CPAP 13/ [facility] since NPSG 01/21/07 AHI 25.1, when body weight was 220 lbs. [facility] no longer checks with her. She has considered CPAP to be "like a miracle" and has used it every night. Once on the beach trip she left it at home and her husband says she snores badly. Bedtime between 9 and 10 PM, sleep latency 25 minutes, waking once or twice before up at 4:45 AM. She denies cardiopulmonary disease. Does have a history of allergic rhinitis, hypertension , DVT left leg, and some sort of liver disease requiring liver biopsy. No ENT surgery. She is married, working as a Biomedical engineer at the [employer]. Mother died of heart failure father died with renal failure, no family history of sleep apnea.  10/26/2015-60 year old female never smoker again seeking to reestablish for OSA., Complicated by allergic rhinitis, HBP, DVT left leg, hypothyroid, GERD, dCHF, PAFib/  R Breast Ca, Ovarian Ca, DM 2 CPAP 13/[facility] Prior note and sleep history reviewed Self referral; Sleep Study 2008; currently on CPAP 13through [facility]-. She has been doing well with CPAP and returns to maintain continuity. She is here with her husband. She reports she "can't live without CPAP". Had difficult time sleeping comfortably when she went to beach for a couple of days without her machine. This machine is now quite old and needs replacement. Compliance is excellent and she is very comfortable with the pressure.  03/12/2016-60 year old female never smoker followed for OSA Complicated by allergic rhinitis, HBP, DVT left leg, hypothyroid, GERD, dCHF, PAFib/  R Breast Ca, Ovarian Ca, DM 2 CPAP 13/[facility] pt states she is getting 8 hrs of sleep at night. pt states she has bad sore throat. pt states she has cough with clear mucus.  She again says she couldn't sleep  without CPAP even for naps download confirms excellent compliance 97%/4 hours, excellent control AHI 2.4/hour. Got a new machine within the last year and it is working well. No change in her breathing except in the last few days she's had some sore throat and cough consistent with a viral respiratory infection. We discussed symptomatic/supportive therapy.  ROS-see HPI Constitutional:   No-   weight loss, night sweats, fevers, chills, fatigue, lassitude. HEENT:   No-  headaches, difficulty swallowing, tooth/dental problems, sore throat,       No-  sneezing, itching, ear ache, nasal congestion, post nasal drip,  CV:  No-   chest pain, orthopnea, PND, +swelling in lower extremities,  No-anasarca,  dizziness, palpitations Resp: +  shortness of breath with exertion or at rest.             + productive cough,  + non-productive cough,  No- coughing up of blood.              No-   change in color of mucus.  No- wheezing.   Skin: No-   rash or lesions. GI:  +  heartburn, indigestion, No-abdominal pain, nausea, vomiting, diarrhea,                 change in bowel habits, loss of appetite GU: No-   dysuria, change in color of urine, no urgency or frequency.  No- flank pain. MS:  No-   joint pain or swelling.  No- decreased range of motion.  No- back pain. Neuro-     nothing unusual  Psych:  No- change in mood or affect. + depression or anxiety.  No memory loss.  OBJ- Physical Exam General- Alert, Oriented, Affect-appropriate, Distress- none acute, +overweight, + wheelchair  Skin- rash-none, lesions- none, excoriation- none Lymphadenopathy- none Head- atraumatic            Eyes- Gross vision intact, PERRLA, conjunctivae and secretions clear            Ears- Hearing, canals-normal            Nose- , no-Septal dev, mucus, polyps, erosion, perforation             Throat- Mallampati IV , mucosa clear , drainage- none, tonsils- atrophic Neck- flexible , trachea midline, no stridor , thyroid nl, carotid no  bruit Chest - symmetrical excursion , unlabored           Heart/CV- RRR , no murmur , no gallop  , no rub, nl s1 s2                           - JVD- none , edema- none, stasis changes- none, varices- none           Lung- clear to P&A, wheeze- none, cough- none , dullness-none, rub- none           Chest wall-  Abd-  Br/ Gen/ Rectal- Not done, not indicated Extrem- cyanosis- none, clubbing, none, atrophy- none, strength- nl Neuro- grossly intact to observation

## 2016-03-12 NOTE — Assessment & Plan Note (Signed)
Good compliance and control confirmed by download. Pressure 13 is comfortable for her. We discussed goals. No changes needed. Briefly discussed oral appliance as an alternative but she is going to stick with CPAP.

## 2016-03-12 NOTE — Assessment & Plan Note (Addendum)
Pulse not strong but seems regular to palpation on this exam

## 2016-03-12 NOTE — Patient Instructions (Signed)
We can continue current CPAP 13, mask of choice, humidifier, supplies, AirView   Dx OSA  Please call if we can help

## 2016-03-14 ENCOUNTER — Encounter: Payer: Self-pay | Admitting: *Deleted

## 2016-03-19 ENCOUNTER — Other Ambulatory Visit: Payer: Self-pay | Admitting: Oncology

## 2016-03-19 ENCOUNTER — Telehealth: Payer: Self-pay | Admitting: Family Medicine

## 2016-03-19 DIAGNOSIS — E785 Hyperlipidemia, unspecified: Secondary | ICD-10-CM

## 2016-03-19 DIAGNOSIS — E1151 Type 2 diabetes mellitus with diabetic peripheral angiopathy without gangrene: Secondary | ICD-10-CM

## 2016-03-19 DIAGNOSIS — K219 Gastro-esophageal reflux disease without esophagitis: Secondary | ICD-10-CM

## 2016-03-19 DIAGNOSIS — E1165 Type 2 diabetes mellitus with hyperglycemia: Principal | ICD-10-CM

## 2016-03-19 DIAGNOSIS — IMO0002 Reserved for concepts with insufficient information to code with codable children: Secondary | ICD-10-CM

## 2016-03-19 NOTE — Telephone Encounter (Signed)
Patient's husband called stating that JANUVIA 100 MG tablet is going to be too expensive of a medication for the patient to be on with them not having insurance. Please advise.    Patient phone:  [phone]

## 2016-03-21 ENCOUNTER — Telehealth: Payer: Self-pay | Admitting: *Deleted

## 2016-03-21 ENCOUNTER — Other Ambulatory Visit: Payer: Self-pay | Admitting: *Deleted

## 2016-03-21 ENCOUNTER — Other Ambulatory Visit: Payer: Self-pay | Admitting: Family Medicine

## 2016-03-21 ENCOUNTER — Other Ambulatory Visit: Payer: Medicare Other

## 2016-03-21 DIAGNOSIS — I10 Essential (primary) hypertension: Secondary | ICD-10-CM

## 2016-03-21 DIAGNOSIS — Z170 Estrogen receptor positive status [ER+]: Secondary | ICD-10-CM

## 2016-03-21 DIAGNOSIS — C50911 Malignant neoplasm of unspecified site of right female breast: Secondary | ICD-10-CM

## 2016-03-21 DIAGNOSIS — C569 Malignant neoplasm of unspecified ovary: Secondary | ICD-10-CM

## 2016-03-21 LAB — LIPID PANEL
CHOL/HDL RATIO: 5
CHOLESTEROL: 152 mg/dL (ref 0–200)
HDL: 31.6 mg/dL — AB (ref >39.00)
LDL CALC: 97 mg/dL (ref 0–99)
NonHDL: 120.17
TRIGLYCERIDES: 118 mg/dL (ref 0.0–149.0)
VLDL: 23.6 mg/dL (ref 0.0–40.0)

## 2016-03-21 LAB — COMPREHENSIVE METABOLIC PANEL
ALBUMIN: 3.4 g/dL — AB (ref 3.5–5.2)
ALT: 33 U/L (ref 0–35)
AST: 66 U/L — AB (ref 0–37)
Alkaline Phosphatase: 153 U/L — ABNORMAL HIGH (ref 39–117)
BILIRUBIN TOTAL: 1.3 mg/dL — AB (ref 0.2–1.2)
BUN: 5 mg/dL — ABNORMAL LOW (ref 6–23)
CALCIUM: 9.3 mg/dL (ref 8.4–10.5)
CHLORIDE: 107 meq/L (ref 96–112)
CO2: 23 mEq/L (ref 19–32)
CREATININE: 0.54 mg/dL (ref 0.40–1.20)
GFR: 121.56 mL/min (ref >60.00)
Glucose, Bld: 161 mg/dL — ABNORMAL HIGH (ref 70–99)
Potassium: 3.8 mEq/L (ref 3.5–5.1)
Sodium: 140 mEq/L (ref 135–145)
Total Protein: 6.9 g/dL (ref 6.0–8.3)

## 2016-03-21 LAB — TSH: TSH: 1.93 u[IU]/mL (ref 0.35–4.50)

## 2016-03-21 LAB — HEMOGLOBIN A1C: Hgb A1c MFr Bld: 7.2 % — ABNORMAL HIGH (ref 4.6–6.5)

## 2016-03-21 MED ORDER — LETROZOLE 2.5 MG PO TABS: 2.5000 mg | ORAL_TABLET | Freq: Every day | ORAL | 4 refills | Status: DC

## 2016-03-21 MED ORDER — ALPRAZOLAM ER 1 MG PO TB24: 1.0000 mg | ORAL_TABLET | Freq: Two times a day (BID) | ORAL | 0 refills | Status: DC

## 2016-03-21 MED ORDER — LIDOCAINE-PRILOCAINE 2.5-2.5 % EX CREA: TOPICAL_CREAM | CUTANEOUS | 0 refills | Status: DC

## 2016-03-21 MED ORDER — OMEPRAZOLE 20 MG PO CPDR: 20.0000 mg | DELAYED_RELEASE_CAPSULE | Freq: Every day | ORAL | 0 refills | Status: DC

## 2016-03-21 MED ORDER — SITAGLIPTIN PHOSPHATE 100 MG PO TABS: 100.0000 mg | ORAL_TABLET | Freq: Every day | ORAL | 0 refills | Status: DC

## 2016-03-21 MED ORDER — METOPROLOL TARTRATE 50 MG PO TABS: 50.0000 mg | ORAL_TABLET | Freq: Two times a day (BID) | ORAL | 1 refills | Status: DC

## 2016-03-21 MED ORDER — CLONAZEPAM 0.5 MG PO TABS: 0.5000 mg | ORAL_TABLET | Freq: Two times a day (BID) | ORAL | 0 refills | Status: DC

## 2016-03-21 MED ORDER — BUPROPION HCL ER (XL) 150 MG PO TB24: 150.0000 mg | ORAL_TABLET | Freq: Every day | ORAL | 0 refills | Status: DC

## 2016-03-21 MED ORDER — METFORMIN HCL ER 500 MG PO TB24: 1000.0000 mg | ORAL_TABLET | Freq: Every evening | ORAL | 0 refills | Status: DC

## 2016-03-21 MED ORDER — LEVOTHYROXINE SODIUM 150 MCG PO TABS: 150.0000 ug | ORAL_TABLET | Freq: Every day | ORAL | 0 refills | Status: DC

## 2016-03-21 MED ORDER — PRAVASTATIN SODIUM 20 MG PO TABS: 20.0000 mg | ORAL_TABLET | Freq: Every day | ORAL | 0 refills | Status: DC

## 2016-03-21 MED ORDER — FUROSEMIDE 20 MG PO TABS: 20.0000 mg | ORAL_TABLET | Freq: Every day | ORAL | 0 refills | Status: DC

## 2016-03-21 MED ORDER — ONDANSETRON HCL 8 MG PO TABS: 8.0000 mg | ORAL_TABLET | Freq: Two times a day (BID) | ORAL | 1 refills | Status: DC

## 2016-03-21 MED ORDER — ESCITALOPRAM OXALATE 20 MG PO TABS: ORAL_TABLET | ORAL | 0 refills | Status: DC

## 2016-03-21 NOTE — Telephone Encounter (Signed)
Caller name: [name] Relationship to patient: Son Can be reached: [phone] Pharmacy:  [place], [place] [phone] (Phone) [phone] (Fax)     Reason for call: Request that all medications be transferred to [facility].

## 2016-03-21 NOTE — Telephone Encounter (Signed)
Called pt's husband. He would like all medications sent to mail order from now on as they will have no copay for this and will be able to afford Januvia if it is sent through mail order. Medications due for refill filled to mail order pharmacy as requested.

## 2016-03-21 NOTE — Telephone Encounter (Signed)
Spoke with husband this am--- they do have ins but Celesta Gentile is too expensive.  Asked him to get [name] formulary or look online to find out what their ins will pay for.  He seemed confused ----- even more confused when I said he could call the ins co and ask them what they pay for.   They may need help with ins co figuring out what we can write.

## 2016-03-21 NOTE — Telephone Encounter (Signed)
Medications due for refill filled to mail order pharmacy as requested.

## 2016-03-21 NOTE — Telephone Encounter (Signed)
Call received @ 1258 from pt. Husband  In regards to A pharmacy by the name of [facility] needing a  Copy of all meds. faxed over that Dr. Jana Hakim has prescribed for [name]. [facility] fax number is [phone].

## 2016-03-24 ENCOUNTER — Telehealth: Payer: Self-pay | Admitting: *Deleted

## 2016-03-24 NOTE — Telephone Encounter (Signed)
"  I need to talk to Val about my wife's PET scan she need this before she is seen in December.  I called and was told to have the nurse call [facility] Scheduling.  I also need to know if she sent the paper work to [employer], Manufacturing engineer for her long term disability.  They have not received anything yet.  return number [phone]."

## 2016-03-28 ENCOUNTER — Telehealth: Payer: Self-pay | Admitting: Family Medicine

## 2016-03-28 NOTE — Telephone Encounter (Signed)
.  Caller name: [name] Relationship to patient: Husband Can be reached: [phone]  Pharmacy:  Reason for call: Plse call patient's husband and let him know exactly what patient is suppose to be taking. Patient states she was told to take Fish oil but was not told how much and husband said it was something else she is suppose to be taking but he can't remember what it is. May leave detailed message on voicemail.

## 2016-03-28 NOTE — Telephone Encounter (Signed)
Left detailed message on pt's vm in regards to medication instruction for fish oil, advised pt to call the office she any further questions or concerns. LB

## 2016-04-04 ENCOUNTER — Encounter: Payer: Self-pay | Admitting: *Deleted

## 2016-04-23 ENCOUNTER — Telehealth: Payer: Self-pay | Admitting: Family Medicine

## 2016-04-23 NOTE — Telephone Encounter (Signed)
Patient scheduled medicare wellness appointment for 05/09/16 at [place] with [facility]

## 2016-04-25 ENCOUNTER — Telehealth: Payer: Self-pay | Admitting: Family Medicine

## 2016-04-25 NOTE — Telephone Encounter (Signed)
Called [employer] informed most recent BP was 122/70 and a1c 7.2.

## 2016-04-25 NOTE — Telephone Encounter (Signed)
[employer] called to get a recent BP and A1C for patient. Please advise   Phone: [phone] ext [phone]

## 2016-04-27 ENCOUNTER — Other Ambulatory Visit: Payer: Self-pay | Admitting: Family Medicine

## 2016-04-27 DIAGNOSIS — IMO0002 Reserved for concepts with insufficient information to code with codable children: Secondary | ICD-10-CM

## 2016-04-27 DIAGNOSIS — E1151 Type 2 diabetes mellitus with diabetic peripheral angiopathy without gangrene: Secondary | ICD-10-CM

## 2016-04-27 DIAGNOSIS — E1165 Type 2 diabetes mellitus with hyperglycemia: Principal | ICD-10-CM

## 2016-04-28 ENCOUNTER — Other Ambulatory Visit: Payer: Self-pay | Admitting: Family

## 2016-04-28 NOTE — Telephone Encounter (Signed)
Rx last sent to [facility] mail order 03/21/16, #90 dispensed; request Denied/SLS 12/18  Medication Detail   Disp Refills Start End   buPROPion (WELLBUTRIN XL) 150 MG 24 hr tablet 90 tablet 0 03/21/2016    Sig - Route: Take 1 tablet (150 mg total) by mouth daily. - Oral   E-Prescribing Status: Receipt confirmed by pharmacy (03/21/2016 4:32 PM EST)   Pharmacy   [facility], [place]

## 2016-04-29 ENCOUNTER — Ambulatory Visit (HOSPITAL_COMMUNITY)
Admission: RE | Admit: 2016-04-29 | Discharge: 2016-04-29 | Disposition: A | Discharge status: Home / Self Care | Payer: Medicare Other | Source: Ambulatory Visit | Attending: Oncology | Admitting: Oncology

## 2016-04-29 DIAGNOSIS — R590 Localized enlarged lymph nodes: Secondary | ICD-10-CM | POA: Diagnosis not present

## 2016-04-29 DIAGNOSIS — C562 Malignant neoplasm of left ovary: Secondary | ICD-10-CM | POA: Diagnosis present

## 2016-04-29 DIAGNOSIS — R161 Splenomegaly, not elsewhere classified: Secondary | ICD-10-CM | POA: Diagnosis not present

## 2016-04-29 DIAGNOSIS — K7460 Unspecified cirrhosis of liver: Secondary | ICD-10-CM | POA: Diagnosis not present

## 2016-04-29 DIAGNOSIS — C50811 Malignant neoplasm of overlapping sites of right female breast: Secondary | ICD-10-CM | POA: Diagnosis present

## 2016-04-29 LAB — GLUCOSE, CAPILLARY: Glucose-Capillary: 63 mg/dL — ABNORMAL LOW (ref 65–99)

## 2016-04-29 MED ORDER — FLUDEOXYGLUCOSE F - 18 (FDG) INJECTION
10.7600 | Freq: Once | INTRAVENOUS | Status: AC | PRN
  Administered 2016-04-29: 10.76 via INTRAVENOUS

## 2016-05-06 ENCOUNTER — Encounter: Payer: Self-pay | Admitting: *Deleted

## 2016-05-06 NOTE — Progress Notes (Signed)
Subjective:   [name] is a 60 y.o. female who presents for an Initial Medicare Annual Wellness Visit.  Review of Systems    No ROS.  Medicare Wellness Visit.  Cardiac Risk Factors include: advanced age (>7mn, >>75women);diabetes mellitus;sedentary lifestyle;obesity (BMI >30kg/m2)   Sleep patterns: Wears CPAP. Sleeps about 8-9 hrs per night. Feels rested when she wakes. Has to get up occasionally to urinate.  Home Safety/Smoke Alarms: Smoke alarms in place. Living environment; residence and Firearm Safety: Lives with husband. Has ramp for wheelchair. No guns. Feels safe. Seat Belt Safety/Bike Helmet: Wears seatbelt.   Counseling:   Eye Exam-Wears glasses for driving and reading. Follow w/Dr.Pope annually. Dental- Dr.Davis as needed.  Female:   Pap- Hysterectomy.       Mammo-  Last on 06/06/15. BI-RADS CATEGORY  1: Negative    Dexa scan- Last 05/11/13: osteopenia.       CCS- Never per pt and declines now.      Objective:    Today's Vitals   05/09/16 1014  BP: 132/66  Pulse: 69  SpO2: 98%  Weight: 214 lb 9.6 oz (97.3 kg)  Height: _0  (1.651 m)   Body mass index is 35.71 kg/m.   Current Medications (verified) Outpatient Encounter Prescriptions as of 05/09/2016  Medication Sig  . acetaminophen (TYLENOL) 500 MG tablet Take 500 mg by mouth every 6 (six) hours as needed for moderate pain.  .Marland KitchenALPRAZolam (XANAX XR) 1 MG 24 hr tablet Take 1 tablet (1 mg total) by mouth 2 (two) times daily.  .Marland KitchenbuPROPion (WELLBUTRIN XL) 150 MG 24 hr tablet Take 1 tablet (150 mg total) by mouth daily.  . clonazePAM (KLONOPIN) 0.5 MG tablet Take 1 tablet (0.5 mg total) by mouth 2 (two) times daily.  .Marland Kitchenescitalopram (LEXAPRO) 20 MG tablet Take 1 tablet (20 mg total) by mouth every morning.  . famotidine (PEPCID) 20 MG tablet Take 1 tablet (20 mg total) by mouth daily.  . furosemide (LASIX) 20 MG tablet Take 1 tablet (20 mg total) by mouth daily.  .Marland Kitchenglimepiride (AMARYL) 2 MG tablet TAKE 1  TABLET EVERY DAY BEFORE BREAKFAST  . glucose blood (BAYER CONTOUR NEXT TEST) test strip Use as instructed to check pt's blood sugar twice a day.  DX E11.9  . levothyroxine (SYNTHROID, LEVOTHROID) 150 MCG tablet Take 1 tablet (150 mcg total) by mouth daily.  .Marland Kitchenlidocaine-prilocaine (EMLA) cream Apply over port area 1-2 hours before chemotherapy  . metFORMIN (GLUCOPHAGE-XR) 500 MG 24 hr tablet Take 2 tablets (1,000 mg total) by mouth every evening.  . metoprolol (LOPRESSOR) 50 MG tablet Take 1 tablet (50 mg total) by mouth 2 (two) times daily.  . metroNIDAZOLE (METROGEL) 1 % gel Apply topically daily.  .Marland KitchenOLANZapine (ZYPREXA) 2.5 MG tablet TAKE 1 TABLET NIGHTLY AT BEDTIME  . omeprazole (PRILOSEC) 20 MG capsule Take 1 capsule (20 mg total) by mouth daily.  .Glory RosebushDELICA LANCETS FINE MISC Check blood sugar twice daily. Dx:E11.9  . potassium chloride (K-DUR) 10 MEQ tablet Take 1 tablet (10 mEq total) by mouth 2 (two) times daily.  . pravastatin (PRAVACHOL) 20 MG tablet Take 1 tablet (20 mg total) by mouth at bedtime.  . sitaGLIPtin (JANUVIA) 100 MG tablet Take 1 tablet (100 mg total) by mouth daily.  .Marland Kitchenletrozole (FEMARA) 2.5 MG tablet Take 1 tablet (2.5 mg total) by mouth daily. (Patient not taking: Reported on 05/09/2016)  . metFORMIN (GLUCOPHAGE-XR) 500 MG 24 hr tablet TAKE 2  TABLETS BY MOUTH EVERY EVENING  . NONFORMULARY OR COMPOUNDED ITEM Cmp, lipid, hgba1c, tsh--- dx , diabetes II, htn, hyperlipidemia, hypothyroidism  . ondansetron (ZOFRAN) 8 MG tablet Take 1 tablet (8 mg total) by mouth 2 (two) times daily. Start the day after chemo for 2 days. Then take as needed for nausea or vomiting. (Patient not taking: Reported on 05/09/2016)  . [DISCONTINUED] ciprofloxacin (CIPRO) 500 MG tablet Take 1 tablet (500 mg total) by mouth 2 (two) times daily.  . [DISCONTINUED] docusate sodium (COLACE) 100 MG capsule Take 1 capsule (100 mg total) by mouth 2 (two) times daily.  . [DISCONTINUED] fluconazole  (DIFLUCAN) 150 MG tablet 1 tab po for one day. Repeat if needed   No facility-administered encounter medications on file as of 05/09/2016.     Allergies (verified) Olmesartan medoxomil; Adhesive [tape]; Tetracycline hcl; and Venlafaxine   History: Past Medical History:  Diagnosis Date  . Allergy   . Anemia    low iron hx  . Breast cancer ([place]) 2014   a. 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 Cataract    removed ou  . Chronic combined systolic and diastolic CHF (congestive heart failure) ([place])    a. 08/2012 Echo: EF 55-60%, no rwma, mod MR;  b. 07/2014 Echo: EF 45-50%, distal antsept HK, mod TR/MR, mildly bil-atrial enlargement.  . Clear Cell Ovarian Cancer    a. 06/2014 s/p TAH/BSO debulking/lymph node dissection;  b. Now on chemo.  . Depression   . Diabetes mellitus   . DJD (degenerative joint disease) of lumbar spine   . DVT (deep venous thrombosis) ([facility])    hx of on HRT left leg ~2006  . GERD (gastroesophageal reflux disease)   . Hyperlipidemia   . Hypertension   . Hypothyroidism   . Liver disease, chronic, with cirrhosis ([place])    ? autoimmune  . OSA on CPAP    cpap 4.5 setting  . Paroxysmal atrial fibrillation ([facility])    a. CHA2DS2VASc = 4-->coumadin.  [name] Physical deconditioning   . PPD positive, treated    rx inh    Past Surgical History:  Procedure Laterality Date  . ABDOMINAL HYSTERECTOMY N/A 06/27/2014   Procedure: HYSTERECTOMY ABDOMINAL TOTAL;  Surgeon: Everitt Amber, MD;  Location: [facility];  Service: Gynecology;  Laterality: N/A;  . BREAST BIOPSY     left breast  . CATARACT EXTRACTION    . CHOLECYSTECTOMY    . EYE SURGERY    . FOOT SURGERY    . history of chemotherapy x 2 treatments, radiation tx  2014  . LAPAROTOMY N/A 06/27/2014   Procedure: EXPLORATORY LAPAROTOMY;  Surgeon: Everitt Amber, MD;  Location: [facility];  Service: Gynecology;  Laterality: N/A;  .  MASTECTOMY MODIFIED RADICAL  06/08/2012   Procedure: MASTECTOMY MODIFIED RADICAL;  Surgeon: Edward Jolly, MD;  Location: [facility];  Service: General;  Laterality: Right;  . pac removed  2015  . PEG TUBE PLACEMENT     peg removed 2015  . PERCUTANEOUS LIVER BIOPSY    . PORTACATH PLACEMENT  06/08/2012   Procedure: INSERTION PORT-A-CATH;  Surgeon: Edward Jolly, MD;  Location: [facility];  Service: General;  Laterality: Left;  . SALPINGOOPHORECTOMY Bilateral 06/27/2014   Procedure: BILATERAL SALPINGO OOPHORECTOMY/TUMOR DEBULKING/LYMPHNODE DISSECTION, OMENTECTOMY;  Surgeon: Everitt Amber, MD;  Location: [facility];  Service: Gynecology;  Laterality: Bilateral;  . TUBAL LIGATION  Family History  Problem Relation Age of Onset  . Diabetes Mother   . Hypertension Mother   . Arthritis Mother   . Heart disease Mother   . Heart failure Mother   . Other Mother     benign breast mass  . Stroke Father   . Heart disease Father   . Dementia Father   . Diabetes Paternal Grandmother   . Colon cancer Paternal Grandfather     dx. 70s-80s  . Other Daughter     potentially has had negative BRCA testing  . Other Daughter     hysterectomy; potentially has had negative genetic testing   Social History   Occupational History  . [employer] History Main Topics  . Smoking status: Former Smoker    Packs/day: 1.00    Years: 1.00  . Smokeless tobacco: Former Systems developer  . Alcohol use No  . Drug use: No     Comment: quit age 36 only smoked as a teen 1 year  . Sexual activity: No    Tobacco Counseling Counseling given: No   Activities of Daily Living In your present state of health, do you have any difficulty performing the following activities: 05/09/2016  Hearing? N  Vision? N  Difficulty concentrating or making decisions? N  Walking or climbing stairs? Y  Dressing or bathing? Y  Doing errands, shopping? Y  Preparing Food and eating ? Y  Using the Toilet? N  In the past six  months, have you accidently leaked urine? N  Do you have problems with loss of bowel control? N  Managing your Medications? Y  Managing your Finances? Y  Housekeeping or managing your Housekeeping? Y  Some recent data might be hidden    Immunizations and Health Maintenance Immunization History  Administered Date(s) Administered  . Hepatitis A 04/03/2009, 03/27/2010  . Hepatitis B 07/01/2001, 08/08/2001, 01/17/2002  . Influenza Split 03/29/2012  . Influenza,inj,Quad PF,36+ Mos 02/02/2013, 01/24/2014, 04/24/2015, 01/28/2016  . Pneumococcal Polysaccharide-23 11/22/2008, 05/15/2014  . Td 05/12/2006   Health Maintenance Due  Topic Date Due  . HIV Screening  03[birth year]02/1969  . COLONOSCOPY  03/02/2004    Patient Care Team: Ann Held, DO as PCP - General (Family Medicine) Chauncey Cruel, MD as Consulting Physician (Oncology) Excell Seltzer, MD as Consulting Physician (General Surgery) Everitt Amber, MD as Consulting Physician (Obstetrics and Gynecology) Gaynelle Arabian, MD as Consulting Physician (Orthopedic Surgery)  Indicate any recent Medical Services you may have received from other than [facility] providers in the past year (date may be approximate).     Assessment:   This is a routine wellness examination for Jazzmon. Physical assessment deferred to PCP.  Hearing/Vision screen  Hearing Screening   _0  _1  _2  _3  _4  _5  _6  _7  _8   Right ear:   Pass Pass Pass  Fail    Left ear:   Pass Pass Pass  Fail    Vision Screening Comments: Doesn't have glasses with her. Pt declined.  Dietary issues and exercise activities discussed: Current Exercise Habits: The patient does not participate in regular exercise at present Diet (meal preparation, eat out, water intake, caffeinated beverages, dairy products, fruits and vegetables): on average, 2 meals per day Breakfast: Egg sandwich and coffee.  Lunch: none. Dinner: Husband cooks just whatever they want.  Pt  states she drinks plenty of water.  Goals    . Weight (lb) < 200 lb (90.7 kg)      Depression Screen [facility] 2/9  Scores 05/09/2016  PHQ - 2 Score 0    Fall Risk Fall Risk  05/09/2016  Falls in the past year? No    Cognitive Function: MMSE - Mini Mental State Exam 05/09/2016  Orientation to time 5  Orientation to Place 5  Registration 3  Attention/ Calculation 5  Recall 2  Language- name 2 objects 2  Language- repeat 1  Language- follow 3 step command 3  Language- read & follow direction 1  Write a sentence 1  Copy design 0  Total score 28        Screening Tests Health Maintenance  Topic Date Due  . HIV Screening  [birth year]02/1971  . COLONOSCOPY  03/02/2004  . OPHTHALMOLOGY EXAM  01/27/2017 (Originally 02/10/2015)  . ZOSTAVAX  01/27/2017 (Originally 03/02/2014)  . TETANUS/TDAP  05/12/2016  . HEMOGLOBIN A1C  09/18/2016  . URINE MICROALBUMIN  10/25/2016  . FOOT EXAM  01/27/2017  . MAMMOGRAM  06/05/2017  . PNEUMOCOCCAL POLYSACCHARIDE VACCINE  Completed  . INFLUENZA VACCINE  Completed  . Hepatitis C Screening  Completed      Plan:     Bring a copy of your advance directives to your next office visit. Continue to eat heart healthy diet (full of fruits, vegetables, whole grains, lean protein, water--limit salt, fat, and sugar intake) and increase physical activity as tolerated.   During the course of the visit, Lowen was educated and counseled about the following appropriate screening and preventive services:   Vaccines to include Pneumoccal, Influenza, Hepatitis B, Td, Zostavax, HCV  Cardiovascular disease screening  Colorectal cancer screening  Bone density screening  Diabetes screening  Glaucoma screening  Mammography/PAP  Nutrition counseling  Patient Instructions (the written plan) were given to the patient.    Shela Nevin, [place]   05/09/2016

## 2016-05-06 NOTE — Telephone Encounter (Signed)
This encounter was created in error - please disregard.

## 2016-05-06 NOTE — Progress Notes (Signed)
Pre visit review using our clinic review tool, if applicable. No additional management support is needed unless otherwise documented below in the visit note. 

## 2016-05-09 ENCOUNTER — Ambulatory Visit (INDEPENDENT_AMBULATORY_CARE_PROVIDER_SITE_OTHER): Payer: Medicare Other | Admitting: *Deleted

## 2016-05-09 ENCOUNTER — Encounter: Payer: Self-pay | Admitting: *Deleted

## 2016-05-09 VITALS — BP 132/66 | HR 69 | Ht 65.0 in | Wt 214.6 lb

## 2016-05-09 DIAGNOSIS — Z0000 Encounter for general adult medical examination without abnormal findings: Secondary | ICD-10-CM

## 2016-05-09 NOTE — Progress Notes (Signed)
reviewed

## 2016-05-09 NOTE — Patient Instructions (Signed)
Bring a copy of your advance directives to your next office visit. Continue to eat heart healthy diet (full of fruits, vegetables, whole grains, lean protein, water--limit salt, fat, and sugar intake) and increase physical activity as tolerated.

## 2016-05-10 ENCOUNTER — Other Ambulatory Visit: Payer: Self-pay | Admitting: Family Medicine

## 2016-05-11 ENCOUNTER — Other Ambulatory Visit: Payer: Self-pay | Admitting: Oncology

## 2016-05-11 ENCOUNTER — Other Ambulatory Visit: Payer: Self-pay | Admitting: Family Medicine

## 2016-05-11 DIAGNOSIS — K219 Gastro-esophageal reflux disease without esophagitis: Secondary | ICD-10-CM

## 2016-05-14 ENCOUNTER — Other Ambulatory Visit: Payer: Self-pay | Admitting: *Deleted

## 2016-05-14 ENCOUNTER — Other Ambulatory Visit: Payer: Self-pay | Admitting: Oncology

## 2016-05-14 DIAGNOSIS — I10 Essential (primary) hypertension: Secondary | ICD-10-CM

## 2016-05-14 DIAGNOSIS — C50811 Malignant neoplasm of overlapping sites of right female breast: Secondary | ICD-10-CM

## 2016-05-14 DIAGNOSIS — C562 Malignant neoplasm of left ovary: Secondary | ICD-10-CM

## 2016-05-14 MED ORDER — POTASSIUM CHLORIDE ER 10 MEQ PO TBCR: 10.0000 meq | EXTENDED_RELEASE_TABLET | Freq: Two times a day (BID) | ORAL | 0 refills | Status: DC

## 2016-05-14 MED ORDER — OLANZAPINE 2.5 MG PO TABS: ORAL_TABLET | ORAL | 0 refills | Status: DC

## 2016-05-14 NOTE — Progress Notes (Unsigned)
I called [name] to give her the results of her December PET scan. These are worrisome for local recurrence of her ovarian cancer. I am setting her up for lab work early next week and to see me January 12 to discuss all this.

## 2016-05-15 ENCOUNTER — Telehealth: Payer: Self-pay | Admitting: *Deleted

## 2016-05-15 NOTE — Telephone Encounter (Signed)
"  Dr. Jana Hakim called last night saying my wife Kayti needs more lab work and an Insurance account manager.  Do you know anything about this?"  Call transferred to scheduler.

## 2016-05-16 ENCOUNTER — Other Ambulatory Visit: Payer: Self-pay | Admitting: *Deleted

## 2016-05-16 MED ORDER — LETROZOLE 2.5 MG PO TABS: 2.5000 mg | ORAL_TABLET | Freq: Every day | ORAL | 4 refills | Status: DC

## 2016-05-19 ENCOUNTER — Ambulatory Visit (HOSPITAL_BASED_OUTPATIENT_CLINIC_OR_DEPARTMENT_OTHER): Payer: Medicare Other

## 2016-05-19 ENCOUNTER — Other Ambulatory Visit (HOSPITAL_BASED_OUTPATIENT_CLINIC_OR_DEPARTMENT_OTHER): Payer: Medicare Other

## 2016-05-19 ENCOUNTER — Other Ambulatory Visit: Payer: Self-pay | Admitting: *Deleted

## 2016-05-19 ENCOUNTER — Ambulatory Visit (HOSPITAL_COMMUNITY)
Admission: RE | Admit: 2016-05-19 | Discharge: 2016-05-19 | Disposition: A | Discharge status: Home / Self Care | Payer: Medicare Other | Source: Ambulatory Visit | Attending: Oncology | Admitting: Oncology

## 2016-05-19 DIAGNOSIS — C562 Malignant neoplasm of left ovary: Secondary | ICD-10-CM

## 2016-05-19 DIAGNOSIS — Z9011 Acquired absence of right breast and nipple: Secondary | ICD-10-CM | POA: Insufficient documentation

## 2016-05-19 DIAGNOSIS — Z923 Personal history of irradiation: Secondary | ICD-10-CM | POA: Insufficient documentation

## 2016-05-19 DIAGNOSIS — Z95828 Presence of other vascular implants and grafts: Secondary | ICD-10-CM

## 2016-05-19 DIAGNOSIS — C569 Malignant neoplasm of unspecified ovary: Secondary | ICD-10-CM

## 2016-05-19 DIAGNOSIS — C50811 Malignant neoplasm of overlapping sites of right female breast: Secondary | ICD-10-CM | POA: Insufficient documentation

## 2016-05-19 LAB — COMPREHENSIVE METABOLIC PANEL
ALBUMIN: 3.2 g/dL — AB (ref 3.5–5.0)
ALK PHOS: 184 U/L — AB (ref 40–150)
ALT: 36 U/L (ref 0–55)
ANION GAP: 10 meq/L (ref 3–11)
AST: 66 U/L — ABNORMAL HIGH (ref 5–34)
BILIRUBIN TOTAL: 1.53 mg/dL — AB (ref 0.20–1.20)
BUN: 6.2 mg/dL — ABNORMAL LOW (ref 7.0–26.0)
CO2: 23 mEq/L (ref 22–29)
Calcium: 8.9 mg/dL (ref 8.4–10.4)
Chloride: 107 mEq/L (ref 98–109)
Creatinine: 0.6 mg/dL (ref 0.6–1.1)
GLUCOSE: 160 mg/dL — AB (ref 70–140)
POTASSIUM: 3.5 meq/L (ref 3.5–5.1)
SODIUM: 139 meq/L (ref 136–145)
TOTAL PROTEIN: 6.8 g/dL (ref 6.4–8.3)

## 2016-05-19 LAB — CBC WITH DIFFERENTIAL/PLATELET
BASO%: 0.4 % (ref 0.0–2.0)
BASOS ABS: 0 10*3/uL (ref 0.0–0.1)
EOS ABS: 0.1 10*3/uL (ref 0.0–0.5)
EOS%: 2.1 % (ref 0.0–7.0)
HCT: 39 % (ref 34.8–46.6)
HEMOGLOBIN: 13.2 g/dL (ref 11.6–15.9)
LYMPH%: 19.5 % (ref 14.0–49.7)
MCH: 31.6 pg (ref 25.1–34.0)
MCHC: 33.8 g/dL (ref 31.5–36.0)
MCV: 93.5 fL (ref 79.5–101.0)
MONO#: 0.3 10*3/uL (ref 0.1–0.9)
MONO%: 7.9 % (ref 0.0–14.0)
NEUT#: 2.7 10*3/uL (ref 1.5–6.5)
NEUT%: 70.1 % (ref 38.4–76.8)
PLATELETS: 79 10*3/uL — AB (ref 145–400)
RBC: 4.17 10*6/uL (ref 3.70–5.45)
RDW: 15.4 % — ABNORMAL HIGH (ref 11.2–14.5)
WBC: 3.9 10*3/uL (ref 3.9–10.3)
lymph#: 0.8 10*3/uL — ABNORMAL LOW (ref 0.9–3.3)

## 2016-05-19 LAB — CEA (IN HOUSE-CHCC): CEA (CHCC-In House): 3.67 ng/mL (ref 0.00–5.00)

## 2016-05-19 MED ORDER — HEPARIN SOD (PORK) LOCK FLUSH 100 UNIT/ML IV SOLN
250.0000 [IU] | Freq: Once | INTRAVENOUS | Status: AC | PRN
  Administered 2016-05-19: 500 [IU]
  Filled 2016-05-19: qty 5

## 2016-05-19 MED ORDER — SODIUM CHLORIDE 0.9 % IJ SOLN
10.0000 mL | INTRAMUSCULAR | Status: DC | PRN
  Administered 2016-05-19: 10 mL
  Filled 2016-05-19: qty 10

## 2016-05-20 ENCOUNTER — Other Ambulatory Visit: Payer: Self-pay | Admitting: Family Medicine

## 2016-05-20 LAB — CANCER ANTIGEN 27-29 (PARALLEL TESTING): CA 27.29: 27 U/mL (ref <38)

## 2016-05-20 LAB — CA 125: CANCER ANTIGEN (CA) 125: 29.2 U/mL (ref 0.0–38.1)

## 2016-05-20 LAB — CANCER ANTIGEN 27.29: CA 27.29: 32.6 U/mL (ref 0.0–38.6)

## 2016-05-21 ENCOUNTER — Other Ambulatory Visit: Payer: Self-pay | Admitting: Family Medicine

## 2016-05-21 ENCOUNTER — Encounter (HOSPITAL_COMMUNITY): Payer: Self-pay | Admitting: *Deleted

## 2016-05-21 ENCOUNTER — Emergency Department (HOSPITAL_COMMUNITY)
Admission: EM | Admit: 2016-05-21 | Discharge: 2016-05-21 | Disposition: A | Discharge status: Home / Self Care | Payer: Medicare Other | Attending: Emergency Medicine | Admitting: Emergency Medicine

## 2016-05-21 ENCOUNTER — Emergency Department (HOSPITAL_COMMUNITY): Payer: Medicare Other

## 2016-05-21 DIAGNOSIS — W01198A Fall on same level from slipping, tripping and stumbling with subsequent striking against other object, initial encounter: Secondary | ICD-10-CM | POA: Diagnosis not present

## 2016-05-21 DIAGNOSIS — S4992XA Unspecified injury of left shoulder and upper arm, initial encounter: Secondary | ICD-10-CM | POA: Diagnosis present

## 2016-05-21 DIAGNOSIS — Z853 Personal history of malignant neoplasm of breast: Secondary | ICD-10-CM | POA: Insufficient documentation

## 2016-05-21 DIAGNOSIS — E119 Type 2 diabetes mellitus without complications: Secondary | ICD-10-CM | POA: Diagnosis not present

## 2016-05-21 DIAGNOSIS — Z79899 Other long term (current) drug therapy: Secondary | ICD-10-CM | POA: Diagnosis not present

## 2016-05-21 DIAGNOSIS — S42292A Other displaced fracture of upper end of left humerus, initial encounter for closed fracture: Secondary | ICD-10-CM

## 2016-05-21 DIAGNOSIS — Z7984 Long term (current) use of oral hypoglycemic drugs: Secondary | ICD-10-CM | POA: Insufficient documentation

## 2016-05-21 DIAGNOSIS — E785 Hyperlipidemia, unspecified: Secondary | ICD-10-CM

## 2016-05-21 DIAGNOSIS — Z87891 Personal history of nicotine dependence: Secondary | ICD-10-CM | POA: Diagnosis not present

## 2016-05-21 DIAGNOSIS — Y92009 Unspecified place in unspecified non-institutional (private) residence as the place of occurrence of the external cause: Secondary | ICD-10-CM | POA: Insufficient documentation

## 2016-05-21 DIAGNOSIS — I110 Hypertensive heart disease with heart failure: Secondary | ICD-10-CM | POA: Diagnosis not present

## 2016-05-21 DIAGNOSIS — Y999 Unspecified external cause status: Secondary | ICD-10-CM | POA: Diagnosis not present

## 2016-05-21 DIAGNOSIS — E039 Hypothyroidism, unspecified: Secondary | ICD-10-CM | POA: Insufficient documentation

## 2016-05-21 DIAGNOSIS — IMO0002 Reserved for concepts with insufficient information to code with codable children: Secondary | ICD-10-CM

## 2016-05-21 DIAGNOSIS — E1165 Type 2 diabetes mellitus with hyperglycemia: Principal | ICD-10-CM

## 2016-05-21 DIAGNOSIS — E1151 Type 2 diabetes mellitus with diabetic peripheral angiopathy without gangrene: Secondary | ICD-10-CM

## 2016-05-21 DIAGNOSIS — Y939 Activity, unspecified: Secondary | ICD-10-CM | POA: Insufficient documentation

## 2016-05-21 DIAGNOSIS — I5042 Chronic combined systolic (congestive) and diastolic (congestive) heart failure: Secondary | ICD-10-CM | POA: Diagnosis not present

## 2016-05-21 MED ORDER — HYDROMORPHONE HCL 2 MG/ML IJ SOLN
1.0000 mg | Freq: Once | INTRAMUSCULAR | Status: AC
  Administered 2016-05-21: 1 mg via INTRAMUSCULAR
  Filled 2016-05-21: qty 1

## 2016-05-21 MED ORDER — OXYCODONE-ACETAMINOPHEN 5-325 MG PO TABS: 1.0000 | ORAL_TABLET | Freq: Four times a day (QID) | ORAL | 0 refills | Status: DC | PRN

## 2016-05-21 MED ORDER — OXYCODONE-ACETAMINOPHEN 5-325 MG PO TABS
1.0000 | ORAL_TABLET | Freq: Once | ORAL | Status: AC
  Administered 2016-05-21: 1 via ORAL
  Filled 2016-05-21: qty 1

## 2016-05-21 MED ORDER — ONDANSETRON 4 MG PO TBDP
8.0000 mg | ORAL_TABLET | Freq: Once | ORAL | Status: AC
  Administered 2016-05-21: 8 mg via ORAL
  Filled 2016-05-21: qty 2

## 2016-05-21 NOTE — ED Provider Notes (Signed)
[facility] DEPT Provider Note   CSN: [id number] Arrival date & time: 05/21/16  0047  By signing my name below, I, Reola Mosher, attest that this documentation has been prepared under the direction and in the presence of Merryl Hacker, MD. Electronically Signed: Reola Mosher, ED Scribe. 05/21/16. 3:30 AM.  History   Chief Complaint Chief Complaint  Patient presents with  . Fall   The history is provided by the patient. No language interpreter was used.    HPI Comments: [name] is a 61 y.o. female with a h/o breast cancer (in remission), DM, and CHF, who presents to the Emergency Department complaining of sudden onset, constant, left arm pain s/p ground-level, mechanical fall which occurred approximately three hours ago. She rates her current pain as 10/10. Pt reports that she was ambulating to her bathroom tonight when she tripped over one of her shoes, causing her to fall onto her left side. No LOC, however, she notes that she did strike the left side of her head on the ground during her fall. No noted treatments were tried prior to coming into the ED for her pain. Her pain is exacerbated with attempted movement of the left arm. Pt is not currently on anticoagulant or antiplatelet therapy. She denies nausea, vomiting, focal, weakness/numbness, chest pain, shortness of breath, or any other associated symptoms.   Past Medical History:  Diagnosis Date  . Allergy   . Anemia    low iron hx  . Breast cancer ([place]) 2014   a. 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 Cataract    removed ou  . Chronic combined systolic and diastolic CHF (congestive heart failure) ([place])    a. 08/2012 Echo: EF 55-60%, no rwma, mod MR;  b. 07/2014 Echo: EF 45-50%, distal antsept HK, mod TR/MR, mildly bil-atrial enlargement.  . Clear Cell Ovarian Cancer    a. 06/2014 s/p TAH/BSO  debulking/lymph node dissection;  b. Now on chemo.  . Depression   . Diabetes mellitus   . DJD (degenerative joint disease) of lumbar spine   . DVT (deep venous thrombosis) (HCC)    hx of on HRT left leg ~2006  . GERD (gastroesophageal reflux disease)   . Hyperlipidemia   . Hypertension   . Hypothyroidism   . Liver disease, chronic, with cirrhosis ([place])    ? autoimmune  . OSA on CPAP    cpap 4.5 setting  . Paroxysmal atrial fibrillation (HCC)    a. CHA2DS2VASc = 4-->coumadin.  [name] Physical deconditioning   . PPD positive, treated    rx inh    Patient Active Problem List   Diagnosis Date Noted  . Port catheter in place 12/17/2015  . Presence of IVC filter   . Chemotherapy-induced neuropathy ([place]) 12/25/2014  . Genetic testing 12/06/2014  . History of breast cancer in female 12/06/2014  . Family history of colon cancer 12/06/2014  . Paroxysmal atrial fibrillation ([place]) 09/25/2014  . Preventative health care 09/17/2014  . Chronic combined systolic and diastolic CHF (congestive heart failure) ([place])   . Hypertension   . Antineoplastic chemotherapy induced pancytopenia ([place])   . Malnutrition of moderate degree ([place]) 07/30/2014  . Elevated LFTs   . Long term current use of anticoagulant therapy 07/29/2014  . Hyperbilirubinemia 07/29/2014  . Hypoalbuminemia 07/29/2014  . Chronic atrial fibrillation ([place]) 07/28/2014  . DM (diabetes mellitus), type 2, uncontrolled, with  renal complications ([place]) 36/14/4315  . 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 Cancer of overlapping sites of right female breast ([place]) 04/03/2014  . Chronic diastolic CHF (congestive heart failure), NYHA class 1 ([place]) 09/16/2012  . DJD (degenerative joint disease) of lumbar spine   . Microcytic anemia 07/12/2012  . Thrombocytopenia ([place]) 07/12/2012  . Allergic rhinitis, cause unspecified 07/28/2011  . Liver disease, chronic, with cirrhosis ([facility])   . IRON DEFICIENCY 06/08/2009  . Hepatic steatosis  09/28/2008  . SPLENOMEGALY 09/28/2008  . Unspecified vitamin D deficiency 08/16/2008  . 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 ARTHRITIS 03/30/2007  . HYPOTHYROIDISM 03/24/2007  . Obstructive sleep apnea 03/24/2007  . HYPERLIPIDEMIA 10/06/2006  . Depression 10/06/2006  . GERD 10/06/2006  . DVT, HX OF 10/06/2006   Past Surgical History:  Procedure Laterality Date  . ABDOMINAL HYSTERECTOMY N/A 06/27/2014   Procedure: HYSTERECTOMY ABDOMINAL TOTAL;  Surgeon: Everitt Amber, MD;  Location: [facility];  Service: Gynecology;  Laterality: N/A;  . BREAST BIOPSY     left breast  . CATARACT EXTRACTION    . CHOLECYSTECTOMY    . EYE SURGERY    . FOOT SURGERY    . history of chemotherapy x 2 treatments, radiation tx  2014  . LAPAROTOMY N/A 06/27/2014   Procedure: EXPLORATORY LAPAROTOMY;  Surgeon: Everitt Amber, MD;  Location: [facility];  Service: Gynecology;  Laterality: N/A;  . MASTECTOMY MODIFIED RADICAL  06/08/2012   Procedure: MASTECTOMY MODIFIED RADICAL;  Surgeon: Edward Jolly, MD;  Location: [facility];  Service: General;  Laterality: Right;  . pac removed  2015  . PEG TUBE PLACEMENT     peg removed 2015  . PERCUTANEOUS LIVER BIOPSY    . PORTACATH PLACEMENT  06/08/2012   Procedure: INSERTION PORT-A-CATH;  Surgeon: Edward Jolly, MD;  Location: [facility];  Service: General;  Laterality: Left;  . SALPINGOOPHORECTOMY Bilateral 06/27/2014   Procedure: BILATERAL SALPINGO OOPHORECTOMY/TUMOR DEBULKING/LYMPHNODE DISSECTION, OMENTECTOMY;  Surgeon: Everitt Amber, MD;  Location: [facility];  Service: Gynecology;  Laterality: Bilateral;  . TUBAL LIGATION     OB History    Gravida Para Term Preterm AB Living   2 2           SAB TAB Ectopic Multiple Live Births                 Home Medications    Prior to Admission medications   Medication Sig Start Date End Date Taking? Authorizing Provider  acetaminophen (TYLENOL) 500 MG tablet Take 500 mg by mouth every 6 (six) hours as needed for moderate pain.    Historical Provider,  MD  ALPRAZolam (XANAX XR) 1 MG 24 hr tablet Take 1 tablet (1 mg total) by mouth 2 (two) times daily. 03/21/16   Chauncey Cruel, MD  buPROPion (WELLBUTRIN XL) 150 MG 24 hr tablet Take 1 tablet (150 mg total) by mouth daily. 03/21/16   Alferd Apa Lowne Chase, DO  clonazePAM (KLONOPIN) 0.5 MG tablet Take 1 tablet (0.5 mg total) by mouth 2 (two) times daily. 03/21/16   Chauncey Cruel, MD  escitalopram (LEXAPRO) 20 MG tablet Take 1 tablet (20 mg total) by mouth every morning. 03/21/16   Rosalita Chessman Chase, DO  famotidine (PEPCID) 20 MG tablet Take 1 tablet (20 mg total) by mouth daily. 08/08/14   Modena Jansky, MD  furosemide (LASIX) 20 MG tablet Take 1 tablet (20 mg total) by mouth daily. 03/21/16   Chauncey Cruel, MD  glimepiride [facility])  2 MG tablet TAKE 1 TABLET EVERY DAY BEFORE BREAKFAST 02/04/16   Alferd Apa Lowne Chase, DO  glucose blood (BAYER CONTOUR NEXT TEST) test strip Use as instructed to check pt's blood sugar twice a day.  DX E11.9 08/31/15   Debbrah Alar, NP  letrozole [facility]) 2.5 MG tablet Take 1 tablet (2.5 mg total) by mouth daily. 05/16/16   Chauncey Cruel, MD  levothyroxine (SYNTHROID, LEVOTHROID) 150 MCG tablet TAKE 1 TABLET BY MOUTH  DAILY 05/13/16   Rosalita Chessman Chase, DO  lidocaine-prilocaine (EMLA) cream Apply over port area 1-2 hours before chemotherapy 03/21/16   Chauncey Cruel, MD  metFORMIN (GLUCOPHAGE-XR) 500 MG 24 hr tablet Take 2 tablets (1,000 mg total) by mouth every evening. 03/21/16   Rosalita Chessman Chase, DO  metFORMIN (GLUCOPHAGE-XR) 500 MG 24 hr tablet TAKE 2 TABLETS BY MOUTH EVERY EVENING 04/28/16   Alferd Apa Lowne Chase, DO  metoprolol (LOPRESSOR) 50 MG tablet Take 1 tablet (50 mg total) by mouth 2 (two) times daily. 03/21/16   Chauncey Cruel, MD  metroNIDAZOLE (METROGEL) 1 % gel Apply topically daily. 06/25/15   Chauncey Cruel, MD  NONFORMULARY OR COMPOUNDED ITEM Cmp, lipid, hgba1c, tsh--- dx , diabetes II, htn, hyperlipidemia,  hypothyroidism 01/28/16   Rosalita Chessman Chase, DO  OLANZapine (ZYPREXA) 2.5 MG tablet TAKE 1 TABLET NIGHTLY AT BEDTIME 05/14/16   Alferd Apa Lowne Chase, DO  omeprazole (PRILOSEC) 20 MG capsule TAKE 1 CAPSULE BY MOUTH  DAILY 05/13/16   Alferd Apa Lowne Chase, DO  ondansetron (ZOFRAN) 8 MG tablet Take 1 tablet (8 mg total) by mouth 2 (two) times daily. Start the day after chemo for 2 days. Then take as needed for nausea or vomiting. Patient not taking: Reported on 05/09/2016 03/21/16   Chauncey Cruel, MD  [facility] DELICA LANCETS FINE MISC Check blood sugar twice daily. Dx:E11.9 07/30/15   Rosalita Chessman Chase, DO  oxyCODONE-acetaminophen (PERCOCET/ROXICET) 5-325 MG tablet Take 1-2 tablets by mouth every 6 (six) hours as needed for severe pain. 05/21/16   Merryl Hacker, MD  potassium chloride (K-DUR) 10 MEQ tablet Take 1 tablet (10 mEq total) by mouth 2 (two) times daily. 05/14/16   Rosalita Chessman Chase, DO  pravastatin (PRAVACHOL) 20 MG tablet Take 1 tablet (20 mg total) by mouth at bedtime. 03/21/16   Alferd Apa Lowne Chase, DO  sitaGLIPtin (JANUVIA) 100 MG tablet Take 1 tablet (100 mg total) by mouth daily. 03/21/16   Ann Held, DO    Family History Family History  Problem Relation Age of Onset  . Diabetes Mother   . Hypertension Mother   . Arthritis Mother   . Heart disease Mother   . Heart failure Mother   . Other Mother     benign breast mass  . Stroke Father   . Heart disease Father   . Dementia Father   . Diabetes Paternal Grandmother   . Colon cancer Paternal Grandfather     dx. 70s-80s  . Other Daughter     potentially has had negative BRCA testing  . Other Daughter     hysterectomy; potentially has had negative genetic testing   Social History Social History  Substance Use Topics  . Smoking status: Former Smoker    Packs/day: 1.00    Years: 1.00  . Smokeless tobacco: Former Systems developer  . Alcohol use No   Allergies   Olmesartan medoxomil; Adhesive [tape]; Tetracycline  hcl; and Venlafaxine  Review of Systems Review of Systems  Constitutional: Negative for fever.  Respiratory: Negative for shortness of breath.   Cardiovascular: Negative for chest pain.  Gastrointestinal: Negative for nausea and vomiting.  Musculoskeletal: Positive for arthralgias and myalgias.  Neurological: Negative for syncope, weakness and numbness.  All other systems reviewed and are negative.  Physical Exam Updated Vital Signs BP 149/69 (BP Location: Right Leg)   Pulse 72   Temp 98.6 F (37 C) (Oral)   Resp 18   Ht _0  (1.651 m)   Wt 215 lb (97.5 kg)   SpO2 94%   BMI 35.78 kg/m   Physical Exam  Constitutional: She is oriented to person, place, and time.  Obese, no acute distress  HENT:  Head: Normocephalic and atraumatic.  Cardiovascular: Normal rate, regular rhythm and normal heart sounds.   No murmur heard. Pulmonary/Chest: Effort normal and breath sounds normal. No respiratory distress. She has no wheezes.  Abdominal: Soft. There is no tenderness.  Musculoskeletal:  Limited range of motion left shoulder secondary to pain, no obvious deformities, 2+ radial pulse, no tenderness or deformity noted of the elbow or forearm  Neurological: She is alert and oriented to person, place, and time.  Skin: Skin is warm and dry.  Psychiatric: She has a normal mood and affect.  Nursing note and vitals reviewed.  ED Treatments / Results  DIAGNOSTIC STUDIES: Oxygen Saturation is 96% on RA, normal by my interpretation.   COORDINATION OF CARE: 3:29 AM-Discussed next steps with pt. Pt verbalized understanding and is agreeable with the plan.   Labs (all labs ordered are listed, but only abnormal results are displayed) Labs Reviewed - No data to display  EKG  EKG Interpretation None      Radiology Dg Chest 2 View  Result Date: 05/19/2016 CLINICAL DATA:  Worsening shortness of breath over the last 2 weeks. History of breast cancer. EXAM: CHEST  2 VIEW COMPARISON:   08/06/2014 FINDINGS: Power port on the left remains in place. Previous right mastectomy. Heart size is at the upper limits of normal. There is pleural and parenchymal scarring on the right presumably secondary to radiation. No sign of active infiltrate, mass, effusion or collapse. No acute bone finding. IMPRESSION: Previous right mastectomy. Pleural and parenchymal scarring on the right presumably secondary to previous radiation. No active process evident. Electronically Signed   By: Nelson Chimes M.D.   On: 05/19/2016 10:23   Dg Shoulder Left  Result Date: 05/21/2016 CLINICAL DATA:  Left shoulder pain and unable to use left arm after fall tonight. EXAM: LEFT SHOULDER - 2+ VIEW COMPARISON:  None. FINDINGS: Transverse impacted fracture of the surgical neck of the left proximal humerus. Vertical fracture line extending across the base of the greater tuberosity. Lateral angulation of the distal fracture fragment. Increased subacromial space suggest effusion. No glenohumeral dislocation. Acromioclavicular joint appears intact. IMPRESSION: Impacted and comminuted fracture of the proximal left humerus with increased subacromial space suggesting a shoulder effusion. Electronically Signed   By: Lucienne Capers M.D.   On: 05/21/2016 01:53   Procedures Procedures   Medications Ordered in ED Medications  oxyCODONE-acetaminophen (PERCOCET/ROXICET) 5-325 MG per tablet 1 tablet (1 tablet Oral Given 05/21/16 0116)  ondansetron (ZOFRAN-ODT) disintegrating tablet 8 mg (8 mg Oral Given 05/21/16 0116)  HYDROmorphone (DILAUDID) injection 1 mg (1 mg Intramuscular Given 05/21/16 0336)   Initial Impression / Assessment and Plan / ED Course  I have reviewed the triage vital signs and the nursing notes.  Pertinent labs &  imaging results that were available during my care of the patient were reviewed by me and considered in my medical decision making (see chart for details).  Clinical Course    Patient presents following a  fall. Injury to the left shoulder. X-rays notable for a comminuted fracture of the proximal humerus. Patient given pain medication. Discussed with Dr. Alvan Dame.  He recommends close follow-up with Dr. Victorino December. Patient will be given pain medication and will be placed in a sling immobilizer.  After history, exam, and medical workup I feel the patient has been appropriately medically screened and is safe for discharge home. Pertinent diagnoses were discussed with the patient. Patient was given return precautions.   Final Clinical Impressions(s) / ED Diagnoses   Final diagnoses:  Other closed displaced fracture of proximal end of left humerus, initial encounter   New Prescriptions New Prescriptions   OXYCODONE-ACETAMINOPHEN (PERCOCET/ROXICET) 5-325 MG TABLET    Take 1-2 tablets by mouth every 6 (six) hours as needed for severe pain.   I personally performed the services described in this documentation, which was scribed in my presence. The recorded information has been reviewed and is accurate.     Merryl Hacker, MD 05/21/16 [id number]

## 2016-05-21 NOTE — ED Triage Notes (Signed)
The pt fell one hour ago at home and struck her shoulder when she fell she has pain in her lt upper arm and shoulder  Pulse present ice pack given

## 2016-05-21 NOTE — Discharge Instructions (Signed)
You were seen today and had a fracture of your left humerus. You need to follow-up with orthopedist this week. You will be placed in a sling immobilizer. You'll be given pain medication.

## 2016-05-21 NOTE — ED Notes (Signed)
Pt departed in NAD.  

## 2016-05-22 ENCOUNTER — Other Ambulatory Visit: Payer: Self-pay | Admitting: Family Medicine

## 2016-05-23 ENCOUNTER — Ambulatory Visit (HOSPITAL_BASED_OUTPATIENT_CLINIC_OR_DEPARTMENT_OTHER): Payer: Medicare Other | Admitting: Oncology

## 2016-05-23 ENCOUNTER — Other Ambulatory Visit: Payer: Self-pay | Admitting: Family Medicine

## 2016-05-23 VITALS — HR 86 | Temp 98.3°F | Resp 18 | Ht 65.0 in | Wt 220.0 lb

## 2016-05-23 DIAGNOSIS — C772 Secondary and unspecified malignant neoplasm of intra-abdominal lymph nodes: Secondary | ICD-10-CM | POA: Diagnosis not present

## 2016-05-23 DIAGNOSIS — G620 Drug-induced polyneuropathy: Secondary | ICD-10-CM

## 2016-05-23 DIAGNOSIS — C50811 Malignant neoplasm of overlapping sites of right female breast: Secondary | ICD-10-CM

## 2016-05-23 DIAGNOSIS — Z79811 Long term (current) use of aromatase inhibitors: Secondary | ICD-10-CM

## 2016-05-23 DIAGNOSIS — E1151 Type 2 diabetes mellitus with diabetic peripheral angiopathy without gangrene: Secondary | ICD-10-CM

## 2016-05-23 DIAGNOSIS — D6959 Other secondary thrombocytopenia: Secondary | ICD-10-CM

## 2016-05-23 DIAGNOSIS — Z170 Estrogen receptor positive status [ER+]: Principal | ICD-10-CM

## 2016-05-23 DIAGNOSIS — E1165 Type 2 diabetes mellitus with hyperglycemia: Principal | ICD-10-CM

## 2016-05-23 DIAGNOSIS — IMO0002 Reserved for concepts with insufficient information to code with codable children: Secondary | ICD-10-CM

## 2016-05-23 DIAGNOSIS — C562 Malignant neoplasm of left ovary: Secondary | ICD-10-CM | POA: Diagnosis not present

## 2016-05-23 DIAGNOSIS — Z86718 Personal history of other venous thrombosis and embolism: Secondary | ICD-10-CM

## 2016-05-23 NOTE — Progress Notes (Signed)
ID: CRISTAL QADIR   DOB: 1955-07-31  MR#: [id number]  WLN#:[id number]  PCP: Ann Held, DO GYN:  SU: [facility] OTHER DE:YCXKGY [name], Shanon Ace, Roney Jaffe  CHIEF COMPLAINT:  Estrogen receptor positive Breast Cancer; Ovarian cancer; DVT  CURRENT TREATMENT: Letrozole  INTERVAL HISTORY: Ily returns today for follow-up of her ovarian cancer as well as her breast cancer, accompanied by her husband [name]. After her last visit here, she was restaged with a PET scan 04/29/2016. This showed mildly hypermetabolic retroperitoneal lymphadenopathy in the left abdominal aortic region, as well as stable cirrhosis and splenomegaly. We set her up for lab work 05/19/2016, and this found the CA 125 2 remained normal at 29.2. CA-27-29 was likewise normal at 32.6 and his CEA was normal at 3.67.  As far as her breast cancer is concerned she continues on letrozole with good tolerance.Hot flashes and vaginal dryness are not a major issue. She never developed the arthralgias or myalgias that many patients can experience on this medication. She obtains it at a good price.  REVIEW OF SYSTEMS: Unfortunately Xochilt fell 05/21/2016 and was evaluated in the emergency room, where plain films of the right shoulder found an impacted comminuted fracture of the proximal left humerus. She was placed in a sling immobilizer and refer to orthopedics--she has an appointment with Dr. Stann Mainland later today. She is taking Percocet for the pain, and is not constipated from that medication. She tells me that she gets short of breath with moderate activity but doesn't have a cough, pleurisy, phlegm production, or any problems with her usual daily activities. She is unsteady on her feet. Overall a detailed review of systems today was stable except as just noted  BREAST CANCER HISTORY: From the original consult note:  Na noted a mass in her right breast mid December 2013, and as it did not  spontaneously resolve over a couple of weeks she brought it to her primary physician's attention. She was set up for diagnostic mammography and right breast ultrasonography at the breast Center 05/10/2012. (Note that the patient's most recent prior mammography had been in October 2008). The current study showed a spiculated mass in the superior subareolar portion of the right breast measuring approximately 5 cm and associated with pleomorphic calcifications. This was firm and palpable. There was right nipple retraction and skin thickening. Ultrasound confirmed an irregularly marginated hypoechoic mass measuring 3.5 cm by ultrasound, and an abnormal appearing lower right axillary lymph node measuring 2.6 cm.  Biopsies of both the breast mass and the abnormal appearing lymph node were performed 05/21/2012. Both showed an invasive ductal carcinoma, grade 2, with similar prognostic panels (the breast mass was 100% estrogen and 73% progesterone receptor positive, with an MIB-1 of 5%; the lymph node was 100% estrogen 100% progesterone receptor positive, with an MIB-1 of 20%). Both masses were HER-2 negative.  Breast MRI obtained at [facility] imaging 05/29/2012 confirmed a dominant mass in the retroareolar right breast measuring 4.4 cm maximally. There was a satellite nodule inferior and lateral to this mass, measuring 1.7 cm. There were no other masses in either breast. Aside from the previously biopsied lymph node there were other mildly enhancing level I right axillary lymph nodes which did not appear pathologic. There was no other lymphadenopathy noted.   The patient's subsequent history is as detailed below.  OVARIAN CANCER HISTORY: From the 06/16/2014 summary note:  "Kensington returns today for review of her restaging studies accompanied by her husband [name]. To  summarize her recent history: She was admitted to [facility] with a diagnosis of possible pneumonia in late December 2015. Chest  x-ray showed some increased density in the right upper lobe. CT scan was suggested and was obtained by Dr. Inda Castle. This showed 2 small nodules in the right middle lobe, measuring 8 and 4 mm respectively. Dr. Inda Castle then contacted [name] for further evaluation and we set Dannah up for a PET scan and which was performed late January. This showed the 2 nodules in the lung not to be hypermetabolic. This of course does not prove that they are not malignant. More importantly, there was a 13.7 cm cystic/solid mass in the upper pelvis associated with adenopathy,. All of this was hypermetabolic."  The patient was evaluated by gynecologic oncology and on 06/27/2014 she underwent surgery under Dr. Denman George. This consisted of an exploratory laparotomy with hysterectomy and abdominal salpingo-oophorectomy, as well as tumor debulking and omentectomy. 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 The tumor measured 11.5 cm, focally involve the ovarian capsule on the left; the uterus and right ovary and fallopian tube were unremarkable. One para-aortic lymph node was positive out of a total of 6 lymph nodes sampled (2 left common iliac, 2 left para-aortic, and to within the omental resection).  Her subsequent history is as detailed below   PAST MEDICAL HISTORY: Past Medical History:  Diagnosis Date  . Allergy   . Anemia    low iron hx  . Breast cancer ([place]) 2014   a. 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 Cataract    removed ou  . Chronic combined systolic and diastolic CHF (congestive heart failure) ([place])    a. 08/2012 Echo: EF 55-60%, no rwma, mod MR;  b. 07/2014 Echo: EF 45-50%, distal antsept HK, mod TR/MR, mildly bil-atrial enlargement.  . Clear Cell Ovarian Cancer    a. 06/2014 s/p TAH/BSO debulking/lymph node  dissection;  b. Now on chemo.  . Depression   . Diabetes mellitus   . DJD (degenerative joint disease) of lumbar spine   . DVT (deep venous thrombosis) (H[facility])    hx of on HRT left leg ~2006  . GERD (gastroesophageal reflux disease)   . Hyperlipidemia   . Hypertension   . Hypothyroidism   . Liver disease, chronic, with cirrhosis ([place])    ? autoimmune  . OSA on CPAP    cpap 4.5 setting  . Paroxysmal atrial fibrillation (H[facility])    a. CHA2DS2VASc = 4-->coumadin.  [name] Physical deconditioning   . PPD positive, treated    rx inh     PAST SURGICAL HISTORY: Past Surgical History:  Procedure Laterality Date  . ABDOMINAL HYSTERECTOMY N/A 06/27/2014   Procedure: HYSTERECTOMY ABDOMINAL TOTAL;  Surgeon: Everitt Amber, MD;  Location: [facility];  Service: Gynecology;  Laterality: N/A;  . BREAST BIOPSY     left breast  . CATARACT EXTRACTION    . CHOLECYSTECTOMY    . EYE SURGERY    . FOOT SURGERY    . history of chemotherapy x 2 treatments, radiation tx  2014  . LAPAROTOMY N/A 06/27/2014   Procedure: EXPLORATORY LAPAROTOMY;  Surgeon: Everitt Amber, MD;  Location: [facility];  Service: Gynecology;  Laterality: N/A;  . MASTECTOMY MODIFIED RADICAL  06/08/2012   Procedure: MASTECTOMY MODIFIED RADICAL;  Surgeon: [name] T  Hoxworth, MD;  Location: [place];  Service: General;  Laterality: Right;  . pac removed  2015  . PEG TUBE PLACEMENT     peg removed 2015  . PERCUTANEOUS LIVER BIOPSY    . PORTACATH PLACEMENT  06/08/2012   Procedure: INSERTION PORT-A-CATH;  Surgeon: Edward Jolly, MD;  Location: [place];  Service: General;  Laterality: Left;  . SALPINGOOPHORECTOMY Bilateral 06/27/2014   Procedure: BILATERAL SALPINGO OOPHORECTOMY/TUMOR DEBULKING/LYMPHNODE DISSECTION, OMENTECTOMY;  Surgeon: Everitt Amber, MD;  Location: [facility];  Service: Gynecology;  Laterality: Bilateral;  . TUBAL LIGATION      FAMILY HISTORY Family History  Problem Relation Age of Onset  . Diabetes Mother   . Hypertension Mother   . Arthritis  Mother   . Heart disease Mother   . Heart failure Mother   . Other Mother     benign breast mass  . Stroke Father   . Heart disease Father   . Dementia Father   . Diabetes Paternal Grandmother   . Colon cancer Paternal Grandfather     dx. 70s-80s  . Other Daughter     potentially has had negative BRCA testing  . Other Daughter     hysterectomy; potentially has had negative genetic testing   the patient's father died in his 1s with a history of dementia. He had had prior strokes. The patient's mother died in her 29s, with a history of congestive heart failure. Nikelle had no brothers, one sister. There is no history of breast or ovarian cancer in the family.  GYNECOLOGIC HISTORY: Menarche age 31, first live birth age 68, she is GX P2, menopause approximately 15 years ago, on hormone replacement until 2010.  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 Her husband [name] works for [employer]. Daughter Paul Half is a Physiological scientist and lives in [place]. Daughter Santiago Bur and her family (husband and 2 children aged 24 and 3 years) currently live with the patient. Dezaree is a member of a [employer].   ADVANCED DIRECTIVES: Not in place  HEALTH MAINTENANCE: (Updated October 2014) Social History  Substance Use Topics  . Smoking status: Former Smoker    Packs/day: 1.00    Years: 1.00  . Smokeless tobacco: Former Systems developer  . Alcohol use No     Colonoscopy: Never  PAP: Does not recall  Bone density: Never  Lipid panel:   Allergies  Allergen Reactions  . Olmesartan Medoxomil Cough  . Adhesive [Tape] Rash  . Tetracycline Hcl Other (See Comments)  . Venlafaxine Other (See Comments)    severe dry mouth    Current Outpatient Prescriptions  Medication Sig Dispense Refill  . acetaminophen (TYLENOL) 500 MG tablet Take 500 mg by mouth every 6 (six) hours as  needed for moderate pain.    [name] ALPRAZolam (XANAX XR) 1 MG 24 hr tablet Take 1 tablet (1 mg total) by mouth 2 (two) times daily. 180 tablet 0  . buPROPion (WELLBUTRIN XL) 150 MG 24 hr tablet TAKE 1 TABLET BY MOUTH  DAILY 90 tablet 1  . clonazePAM (KLONOPIN) 0.5 MG tablet Take 1 tablet (0.5 mg total) by mouth 2 (two) times daily. 180 tablet 0  . escitalopram (LEXAPRO) 20 MG tablet Take 1 tablet (20 mg total) by mouth every morning. 90 tablet 0  . famotidine (PEPCID) 20 MG tablet Take 1 tablet (20 mg total) by mouth daily. 30 tablet  0  . furosemide (LASIX) 20 MG tablet Take 1 tablet (20 mg total) by mouth daily. 90 tablet 0  . glimepiride (AMARYL) 2 MG tablet TAKE 1 TABLET EVERY DAY BEFORE BREAKFAST 90 tablet 1  . glucose blood (BAYER CONTOUR NEXT TEST) test strip Use as instructed to check pt's blood sugar twice a day.  DX E11.9 100 each 5  . letrozole (FEMARA) 2.5 MG tablet Take 1 tablet (2.5 mg total) by mouth daily. 90 tablet 4  . levothyroxine (SYNTHROID, LEVOTHROID) 150 MCG tablet TAKE 1 TABLET BY MOUTH  DAILY 90 tablet 1  . lidocaine-prilocaine (EMLA) cream Apply over port area 1-2 hours before chemotherapy 30 g 0  . metFORMIN (GLUCOPHAGE-XR) 500 MG 24 hr tablet TAKE 2 TABLETS BY MOUTH EVERY EVENING 60 tablet 2  . metFORMIN (GLUCOPHAGE-XR) 500 MG 24 hr tablet TAKE 2 TABLETS BY MOUTH  EVERY EVENING 180 tablet 1  . metoprolol (LOPRESSOR) 50 MG tablet Take 1 tablet (50 mg total) by mouth 2 (two) times daily. 180 tablet 1  . metroNIDAZOLE (METROGEL) 1 % gel Apply topically daily. 45 g 0  . NONFORMULARY OR COMPOUNDED ITEM Cmp, lipid, hgba1c, tsh--- dx , diabetes II, htn, hyperlipidemia, hypothyroidism 1 each 0  . OLANZapine (ZYPREXA) 2.5 MG tablet TAKE 1 TABLET NIGHTLY AT BEDTIME 90 tablet 0  . omeprazole (PRILOSEC) 20 MG capsule TAKE 1 CAPSULE BY MOUTH  DAILY 90 capsule 0  . ondansetron (ZOFRAN) 8 MG tablet Take 1 tablet (8 mg total) by mouth 2 (two) times daily. Start the day after chemo for 2  days. Then take as needed for nausea or vomiting. (Patient not taking: Reported on 05/09/2016) 30 tablet 1  . ONETOUCH DELICA LANCETS FINE MISC Check blood sugar twice daily. Dx:E11.9 100 each 12  . oxyCODONE-acetaminophen (PERCOCET/ROXICET) 5-325 MG tablet Take 1-2 tablets by mouth every 6 (six) hours as needed for severe pain. 15 tablet 0  . potassium chloride (K-DUR) 10 MEQ tablet Take 1 tablet (10 mEq total) by mouth 2 (two) times daily. 180 tablet 0  . pravastatin (PRAVACHOL) 20 MG tablet TAKE 1 TABLET BY MOUTH AT  BEDTIME 90 tablet 1  . sitaGLIPtin (JANUVIA) 100 MG tablet Take 1 tablet (100 mg total) by mouth daily. 90 tablet 0   No current facility-administered medications for this visit.     OBJECTIVE: Middle-aged white woman Examined in a wheelchair Vitals:   05/23/16 0820  Pulse: 86  Resp: 18  Temp: 98.3 F (36.8 C)     Body mass index is 36.61 kg/m.    ECOG FS: 2 Filed Weights   05/23/16 0820  Weight: 220 lb (99.8 kg)   Sclerae unicteric, pupils round and equal Oropharynx clear and moist-- no thrush or other lesions No cervical or supraclavicular adenopathy Lungs no rales or rhonchi Heart regular rate and rhythm Abd soft, obese, nontender, positive bowel sounds MSK left upper extremity in a sling Neuro: nonfocal, well oriented, appropriate affect Breasts: Deferred   Lab Results  Component Value Date   WBC 3.9 05/19/2016   NEUTROABS 2.7 05/19/2016   HGB 13.2 05/19/2016   HCT 39.0 05/19/2016   MCV 93.5 05/19/2016   PLT 79 (L) 05/19/2016     Chemistry      Component Value Date/Time   NA 139 05/19/2016 0742   K 3.5 05/19/2016 0742   CL 107 03/21/2016 0854   CL 104 10/29/2012 1058   CO2 23 05/19/2016 0742   BUN 6.2 (L) 05/19/2016 7048  CREATININE 0.6 05/19/2016 0742      Component Value Date/Time   CALCIUM 8.9 05/19/2016 0742   ALKPHOS 184 (H) 05/19/2016 0742   AST 66 (H) 05/19/2016 0742   ALT 36 05/19/2016 0742   BILITOT 1.53 (H) 05/19/2016 0742      Results for MADHURI, VACCA (MRN [id number]) as of 05/23/2016 08:40  Ref. Range 06/16/2014 12:23 06/16/2014 12:23 08/11/2014 09:50 09/08/2014 12:36 10/06/2014 07:53 10/27/2014 09:24 11/16/2014 10:54 11/24/2014 07:49 12/25/2014 09:55 01/08/2015 09:05 02/05/2015 08:03 03/05/2015 08:08 03/30/2015 09:50 05/08/2015 09:16 06/25/2015 09:15 08/06/2015 09:09  CA 125 Latest Ref Range: <35 U/mL 71 (H) 52.5 (H) 544 (H) 213 (H) 113 (H) 123 (H) 86 (H) 93 (H) 88 (H) 103 (H) 77 (H) 70 (H) _0 Results for TEMIKA, SUTPHIN (MRN [id number]) as of 05/23/2016 08:40  Ref. Range 05/19/2016 07:42  CA 27.29 Latest Ref Range: <38 U/mL 27  CA 27.29 Latest Ref Range: 0.0 - 38.6 U/mL 32.6  Cancer Antigen (CA) 125 Latest Ref Range: 0.0 - 38.1 U/mL 29.2   STUDIES: Dg Chest 2 View  Result Date: 05/19/2016 CLINICAL DATA:  Worsening shortness of breath over the last 2 weeks. History of breast cancer. EXAM: CHEST  2 VIEW COMPARISON:  08/06/2014 FINDINGS: Power port on the left remains in place. Previous right mastectomy. Heart size is at the upper limits of normal. There is pleural and parenchymal scarring on the right presumably secondary to radiation. No sign of active infiltrate, mass, effusion or collapse. No acute bone finding. IMPRESSION: Previous right mastectomy. Pleural and parenchymal scarring on the right presumably secondary to previous radiation. No active process evident. Electronically Signed   By: Nelson Chimes M.D.   On: 05/19/2016 10:23   Nm Pet Image Restag (ps) Skull Base To Thigh  Result Date: 04/29/2016 CLINICAL DATA:  Subsequent treatment strategy for left ovarian carcinoma and right breast carcinoma. EXAM: NUCLEAR MEDICINE PET SKULL BASE TO THIGH TECHNIQUE: 10.8 mCi F-18 FDG was injected intravenously. Full-ring PET imaging was performed from the skull base to thigh after the radiotracer. CT data was obtained and used for attenuation correction and anatomic localization. FASTING BLOOD GLUCOSE:  Value: 63 mg/dl COMPARISON:   03/30/2015 FINDINGS: NECK No hypermetabolic lymph nodes in the neck. CHEST No hypermetabolic mediastinal or hilar nodes. Previous right mastectomy. No evidence of hypermetabolic axillary lymph nodes. 4 mm right middle lobe pulmonary nodule on image 36/8 remains stable, without FDG uptake. No new or enlarging pulmonary nodules seen on CT. Stable post radiation changes seen in right upper lobe. No evidence of pleural effusion. ABDOMEN/PELVIS No abnormal hypermetabolic activity within the liver, pancreas, adrenal glands, or spleen. Hepatic cirrhosis and splenomegaly remains stable. Abdominal retroperitoneal lymphadenopathy in the left paraaortic region has increased since previous study, with largest lymph node measuring 1.7 cm on image 129/4 compared to 9 mm previously. This has current SUV max 10.8 compared to 1.3 on previous study. No other sites of hypermetabolic lymphadenopathy identified within the orbits. Prior hysterectomy again noted. No evidence of pelvic mass or ascites. SKELETON No focal hypermetabolic bone lesions identified. Diffuse hypermetabolic bone marrow activity has resolved since previous study, consistent with resolving post treatment response. IMPRESSION: Increased mild hypermetabolic retroperitoneal lymphadenopathy in left abdominal paraaortic region, consistent with metastatic disease. No other sites of metabolically active metastatic disease identified. Stable cirrhosis and splenomegaly. Electronically Signed   By: Earle Gell M.D.   On: 04/29/2016 13:26   Dg Shoulder Left  Result Date: 05/21/2016 CLINICAL DATA:  Left shoulder pain and unable to use left arm after fall tonight. EXAM: LEFT SHOULDER - 2+ VIEW COMPARISON:  None. FINDINGS: Transverse impacted fracture of the surgical neck of the left proximal humerus. Vertical fracture line extending across the base of the greater tuberosity. Lateral angulation of the distal fracture fragment. Increased subacromial space suggest effusion. No  glenohumeral dislocation. Acromioclavicular joint appears intact. IMPRESSION: Impacted and comminuted fracture of the proximal left humerus with increased subacromial space suggesting a shoulder effusion. Electronically Signed   By: Lucienne Capers M.D.   On: 05/21/2016 01:53     ASSESSMENT: 61 y.o.  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    (2)  treated in the adjuvant setting with docetaxel/ cyclophosphamide given every 3 weeks. There were multiple and severe complications, and the  patient tolerated only two cycles, last dose 07/29/2012  (3) letrozole started 08/16/2012  (a) osteopenia, with a T score of -1.14 April 2013  (4) postmastectomy radiation completed 12/24/2012  (5)  comorbidities include diabetes, hypertension, chronic liver disease with cirrhosis and fatty liver, and nutritional disturbance with temporarily dependence on PEG feeds (discontinued July 2014).  (6) restaging studies January 2016 show  (a) 8 mm and 4 mm Right middle lobe lung nodules, not metabolically active on PET -- will require follow-up  (b) hypermetabolic 03.0 cm mixed solid/cystic lesion in upper pelvis, with associated adenopathy  OVARIAN CANCER (7) status post exploratory laparotomy 06/27/2014 with total abdominal hysterectomy, bilateral salpingo-oophorectomy, para-aortic lymphadenectomy, omentectomy and radical tumor debulking for a left-sided clear cell ovarian cancer, pT1c pN1, stage IIIC .  (8) adjuvant chemotherapy consisting of carboplatin and paclitaxel given weekly days 1 and 8 of each 21 day cycle, for 6-8 cycles as tolerated, started 07/17/2014, with onpro support  (a) day 8 cycle 2 omitted and further treatments cancelled because of an episode of Klebsiella sepsis  requiring intensive care unit admission-- last chemotherapy dose 03/26/2015  (9) bilateral lower extremity DVTs documented 09/25/2014 despite being on Coumadin for PAF (subtherapeutic INR) with mobile Right common femoral clot  (a) status post thrombolysis 09/30/2014  (b) on Lovenox daily as of 10/02/2014, stopped April 2017  (c) IVC filter placed 10/26/2014 as mobile clot still noted Right groin  (d) IVC filter removed 01/30/2015 with documented resolution of the movable thrombus 01/16/2015  (e) normal quantitative D-dimer 08/06/2015 and 09/24/2015  (10) started abraxane 10/13/2014, repeated every 14 days for 6 cycles (12 doses), completed 03/26/2015  (11) genetics testing December 04, 2014 through the Breast/Ovarian Cancer Panel [facility] no deleterious mutations in ATM, BARD1, BRCA1, BRCA2, BRIP1, CDH1, CHEK2, FANCC, MLH1, MSH2, MSH6, NBN, PALB2, PMS2, PTEN, RAD51C, RAD51D, STK11, TP53, and XRCC2.    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 The December PET scan showed some increase in her left para-aortic adenopathy, which was "hot" on that study. This raised the question whether we were seeing metastatic disease, either breast or ovarian. However all her markers are normal, and in particular her CA-125 was quite elevated when her ovarian cancer was active. The fact that it remains normal is reassuring  I don't think we are going to be dealing with an infection since her total white cell count is also in the low normal range.  At this point I think the best strategy as far as these PET findings are concerned is to repeat them in March. This will give [name] a little bit more information and I think this is safe particularly since she is entirely asymptomatic  from this. If we see further progression in the absence of any other evidence of breast or ovarian cancer activity, we may need to do all biopsy of one of those lymph nodes.  In the meantime of course she has her left humeral fracture. She will see orthopedics today. Her platelet count is a little under 100,000, her "true" platelet count actually would be normal. What we are seeing is the effect of splenomegaly and if she were to bleed for example the platelets "hiding" in her spleen out would be mobilized. In short if she were to need surgery I don't think the platelet count would be an issue for her.  She is going to see me again in March. She will have a CA-125 and PET scan repeated before that visit. She knows to call for any problems that may develop before then.  Chauncey Cruel, MD    05/23/2016

## 2016-06-09 ENCOUNTER — Ambulatory Visit: Payer: Medicare Other

## 2016-06-15 ENCOUNTER — Other Ambulatory Visit: Payer: Self-pay | Admitting: Family Medicine

## 2016-06-17 NOTE — Telephone Encounter (Signed)
Pt. Is due for her 6 month Follow up Please call and schedule appointment.

## 2016-06-18 NOTE — Telephone Encounter (Signed)
Left message on voicemail @ [phone] fo rpatient to call and schedule follow up

## 2016-06-30 ENCOUNTER — Other Ambulatory Visit: Payer: Medicare Other

## 2016-06-30 ENCOUNTER — Ambulatory Visit: Payer: Medicare Other | Admitting: Oncology

## 2016-07-03 ENCOUNTER — Other Ambulatory Visit: Payer: Self-pay | Admitting: Family Medicine

## 2016-07-07 ENCOUNTER — Ambulatory Visit: Payer: Medicare Other | Admitting: Oncology

## 2016-07-09 ENCOUNTER — Other Ambulatory Visit: Payer: Self-pay | Admitting: Family Medicine

## 2016-07-09 DIAGNOSIS — I10 Essential (primary) hypertension: Secondary | ICD-10-CM

## 2016-07-16 ENCOUNTER — Ambulatory Visit
Admission: RE | Admit: 2016-07-16 | Discharge: 2016-07-16 | Disposition: A | Discharge status: Home / Self Care | Payer: Medicare Other | Source: Ambulatory Visit | Attending: Oncology | Admitting: Oncology

## 2016-07-16 DIAGNOSIS — Z1231 Encounter for screening mammogram for malignant neoplasm of breast: Secondary | ICD-10-CM

## 2016-07-19 ENCOUNTER — Other Ambulatory Visit: Payer: Self-pay | Admitting: Family Medicine

## 2016-07-22 ENCOUNTER — Other Ambulatory Visit: Payer: Self-pay | Admitting: Family Medicine

## 2016-07-22 MED ORDER — GLIMEPIRIDE 2 MG PO TABS: 2.0000 mg | ORAL_TABLET | Freq: Every day | ORAL | 1 refills | Status: DC

## 2016-07-24 ENCOUNTER — Other Ambulatory Visit: Payer: Self-pay | Admitting: Oncology

## 2016-07-24 ENCOUNTER — Encounter (HOSPITAL_COMMUNITY)
Admission: RE | Admit: 2016-07-24 | Discharge: 2016-07-24 | Disposition: A | Discharge status: Home / Self Care | Payer: Medicare Other | Source: Ambulatory Visit | Attending: Oncology | Admitting: Oncology

## 2016-07-24 DIAGNOSIS — C562 Malignant neoplasm of left ovary: Secondary | ICD-10-CM | POA: Diagnosis present

## 2016-07-24 DIAGNOSIS — C50811 Malignant neoplasm of overlapping sites of right female breast: Secondary | ICD-10-CM | POA: Diagnosis not present

## 2016-07-24 DIAGNOSIS — Z170 Estrogen receptor positive status [ER+]: Secondary | ICD-10-CM | POA: Insufficient documentation

## 2016-07-24 DIAGNOSIS — C772 Secondary and unspecified malignant neoplasm of intra-abdominal lymph nodes: Secondary | ICD-10-CM | POA: Diagnosis present

## 2016-07-24 LAB — GLUCOSE, CAPILLARY: Glucose-Capillary: 248 mg/dL — ABNORMAL HIGH (ref 65–99)

## 2016-07-24 MED ORDER — FLUDEOXYGLUCOSE F - 18 (FDG) INJECTION
10.8500 | Freq: Once | INTRAVENOUS | Status: AC | PRN
  Administered 2016-07-24: 10.85 via INTRAVENOUS

## 2016-07-25 ENCOUNTER — Telehealth: Payer: Self-pay | Admitting: Family Medicine

## 2016-07-25 ENCOUNTER — Other Ambulatory Visit: Payer: Self-pay | Admitting: Family Medicine

## 2016-07-25 NOTE — Telephone Encounter (Signed)
Called the patients husband back informed of PCP instructions to contact insurance for formulary preference and then let us know.  He verbalized understanding.

## 2016-07-25 NOTE — Telephone Encounter (Signed)
Caller name: [name] Relationship to patient: Husband Can be reached: [phone] Pharmacy:  Reason for call: Patient request to have another medication to substitute Januvia because it is too expensive. Plse adv

## 2016-07-25 NOTE — Telephone Encounter (Signed)
I will need to see her formulary so I can see what her insurance will pay for.

## 2016-07-26 ENCOUNTER — Other Ambulatory Visit: Payer: Self-pay | Admitting: Family Medicine

## 2016-07-28 ENCOUNTER — Telehealth: Payer: Self-pay | Admitting: Emergency Medicine

## 2016-07-28 NOTE — Telephone Encounter (Signed)
Spoke with patient's spouse; advised him per Dr Jana Hakim that her PET scan results look good!!! Informed them that she will still need to see Dr Jana Hakim tomorrow 3/20 as scheduled; verbalized understanding and very grateful for the call.

## 2016-07-29 ENCOUNTER — Encounter: Payer: Self-pay | Admitting: Oncology

## 2016-07-29 ENCOUNTER — Ambulatory Visit (HOSPITAL_BASED_OUTPATIENT_CLINIC_OR_DEPARTMENT_OTHER): Payer: Medicare Other | Admitting: Oncology

## 2016-07-29 ENCOUNTER — Ambulatory Visit: Payer: Medicare Other

## 2016-07-29 ENCOUNTER — Other Ambulatory Visit (HOSPITAL_BASED_OUTPATIENT_CLINIC_OR_DEPARTMENT_OTHER): Payer: Medicare Other

## 2016-07-29 ENCOUNTER — Encounter: Payer: Self-pay | Admitting: Gastroenterology

## 2016-07-29 VITALS — BP 143/71 | HR 79 | Temp 97.9°F | Resp 18 | Ht 65.0 in | Wt 212.5 lb

## 2016-07-29 DIAGNOSIS — K7581 Nonalcoholic steatohepatitis (NASH): Secondary | ICD-10-CM

## 2016-07-29 DIAGNOSIS — C50811 Malignant neoplasm of overlapping sites of right female breast: Secondary | ICD-10-CM

## 2016-07-29 DIAGNOSIS — B372 Candidiasis of skin and nail: Secondary | ICD-10-CM

## 2016-07-29 DIAGNOSIS — Z8543 Personal history of malignant neoplasm of ovary: Secondary | ICD-10-CM | POA: Diagnosis not present

## 2016-07-29 DIAGNOSIS — Z95828 Presence of other vascular implants and grafts: Secondary | ICD-10-CM

## 2016-07-29 DIAGNOSIS — R161 Splenomegaly, not elsewhere classified: Secondary | ICD-10-CM | POA: Diagnosis not present

## 2016-07-29 DIAGNOSIS — C569 Malignant neoplasm of unspecified ovary: Secondary | ICD-10-CM

## 2016-07-29 DIAGNOSIS — Z170 Estrogen receptor positive status [ER+]: Secondary | ICD-10-CM

## 2016-07-29 DIAGNOSIS — C562 Malignant neoplasm of left ovary: Secondary | ICD-10-CM

## 2016-07-29 LAB — COMPREHENSIVE METABOLIC PANEL
ALBUMIN: 3 g/dL — AB (ref 3.5–5.0)
ALT: 57 U/L — AB (ref 0–55)
AST: 83 U/L — AB (ref 5–34)
Alkaline Phosphatase: 212 U/L — ABNORMAL HIGH (ref 40–150)
Anion Gap: 10 mEq/L (ref 3–11)
BUN: 4.9 mg/dL — ABNORMAL LOW (ref 7.0–26.0)
CO2: 23 meq/L (ref 22–29)
Calcium: 8.9 mg/dL (ref 8.4–10.4)
Chloride: 108 mEq/L (ref 98–109)
Creatinine: 0.7 mg/dL (ref 0.6–1.1)
GLUCOSE: 179 mg/dL — AB (ref 70–140)
Potassium: 4.3 mEq/L (ref 3.5–5.1)
SODIUM: 140 meq/L (ref 136–145)
TOTAL PROTEIN: 6.5 g/dL (ref 6.4–8.3)
Total Bilirubin: 1.69 mg/dL — ABNORMAL HIGH (ref 0.20–1.20)

## 2016-07-29 LAB — CBC WITH DIFFERENTIAL/PLATELET
BASO%: 0.3 % (ref 0.0–2.0)
Basophils Absolute: 0 10*3/uL (ref 0.0–0.1)
EOS%: 1.7 % (ref 0.0–7.0)
Eosinophils Absolute: 0.1 10*3/uL (ref 0.0–0.5)
HCT: 40.5 % (ref 34.8–46.6)
HEMOGLOBIN: 13.5 g/dL (ref 11.6–15.9)
LYMPH%: 13.7 % — AB (ref 14.0–49.7)
MCH: 32.4 pg (ref 25.1–34.0)
MCHC: 33.2 g/dL (ref 31.5–36.0)
MCV: 97.5 fL (ref 79.5–101.0)
MONO#: 0.4 10*3/uL (ref 0.1–0.9)
MONO%: 6.3 % (ref 0.0–14.0)
NEUT%: 78 % — ABNORMAL HIGH (ref 38.4–76.8)
NEUTROS ABS: 4.9 10*3/uL (ref 1.5–6.5)
Platelets: 97 10*3/uL — ABNORMAL LOW (ref 145–400)
RBC: 4.15 10*6/uL (ref 3.70–5.45)
RDW: 16 % — ABNORMAL HIGH (ref 11.2–14.5)
WBC: 6.3 10*3/uL (ref 3.9–10.3)
lymph#: 0.9 10*3/uL (ref 0.9–3.3)

## 2016-07-29 MED ORDER — HEPARIN SOD (PORK) LOCK FLUSH 100 UNIT/ML IV SOLN
500.0000 [IU] | Freq: Once | INTRAVENOUS | Status: AC
  Administered 2016-07-29: 500 [IU] via INTRAVENOUS
  Filled 2016-07-29: qty 5

## 2016-07-29 MED ORDER — LETROZOLE 2.5 MG PO TABS: 2.5000 mg | ORAL_TABLET | Freq: Every day | ORAL | 4 refills | Status: DC

## 2016-07-29 MED ORDER — SODIUM CHLORIDE 0.9% FLUSH
10.0000 mL | INTRAVENOUS | Status: DC | PRN
  Administered 2016-07-29: 10 mL via INTRAVENOUS
  Filled 2016-07-29: qty 10

## 2016-07-29 MED ORDER — KETOCONAZOLE 2 % EX CREA: 1.0000 "application " | TOPICAL_CREAM | Freq: Every day | CUTANEOUS | 3 refills | Status: DC

## 2016-07-29 NOTE — Progress Notes (Signed)
ID: [name]   DOB: 20-Jul-1955  MR#: [id number]  QPR#:[id number]  PCP: Rhonda Held, DO GYN:  SU: [name] OTHER LD:JTTSVX [name], [name], [name]  CHIEF COMPLAINT:  Estrogen receptor positive Breast Cancer; Ovarian cancer; DVT  CURRENT TREATMENT: Letrozole  INTERVAL HISTORY: [name] returns today for follow-up of her remote breast cancer and her less remote ovarian cancer. Since her last visit here we have restaged her with a PET scan and which thankfully shows no evidence of active disease. In particular the area of adenopathy we noted is not greater, perhaps a little smaller, and certainly much dimmer.  Her CA-125 remains in the normal range Results for [name], [name] (MRN [id number]) as of 07/29/2016 16:53  Ref. Range 03/05/2015 08:08 03/30/2015 09:50 05/08/2015 09:16 06/25/2015 09:15 08/06/2015 09:09  CA 125 Latest Ref Range: <35 U/mL 70 (H) _0 She continues on letrozole with good tolerance.  Hot flashes and vaginal dryness are not a major issue. She never developed the arthralgias or myalgias that many patients can experience on this medication. She obtains it at a good price.   REVIEW OF SYSTEMS:  [name] is still somewhat recovering from her fall in January. She is trying to walk a little more. She has problems with her gums, anxiety and depression, and her sugars are not well controlled. Aside from these issues a detailed review of systems today was stable  BREAST CANCER HISTORY: From the original consult note:  [name] noted a mass in her right breast mid December 2013, and as it did not spontaneously resolve over a couple of weeks she brought it to her primary physician's attention. She was set up for diagnostic mammography and right breast ultrasonography at the [name] 05/10/2012. (Note that the patient's most recent prior mammography had been in October 2008). The current study showed a spiculated mass in the  superior subareolar portion of the right breast measuring approximately 5 cm and associated with pleomorphic calcifications. This was firm and palpable. There was right nipple retraction and skin thickening. Ultrasound confirmed an irregularly marginated hypoechoic mass measuring 3.5 cm by ultrasound, and an abnormal appearing lower right axillary lymph node measuring 2.6 cm.  Biopsies of both the breast mass and the abnormal appearing lymph node were performed 05/21/2012. Both showed an invasive ductal carcinoma, grade 2, with similar prognostic panels (the breast mass was 100% estrogen and 73% progesterone receptor positive, with an MIB-1 of 5%; the lymph node was 100% estrogen 100% progesterone receptor positive, with an MIB-1 of 20%). Both masses were HER-2 negative.  Breast MRI obtained at [name] 05/29/2012 confirmed a dominant mass in the retroareolar right breast measuring 4.4 cm maximally. There was a satellite nodule inferior and lateral to this mass, measuring 1.7 cm. There were no other masses in either breast. Aside from the previously biopsied lymph node there were other mildly enhancing level I right axillary lymph nodes which did not appear pathologic. There was no other lymphadenopathy noted.   The patient's subsequent history is as detailed below.  OVARIAN CANCER HISTORY: From the 06/16/2014 summary note:  "[name] returns today for review of her restaging studies accompanied by her husband [name]. To summarize her recent history: She was admitted to [name] with a diagnosis of possible pneumonia in late December 2015. Chest x-ray showed some increased density in the right upper lobe. CT scan was suggested and was obtained by [name]. This showed  2 small nodules in the right middle lobe, measuring 8 and 4 mm respectively. [name] then contacted [name] for further evaluation and we set [name] up for a PET scan and which was performed late January. This  showed the 2 nodules in the lung not to be hypermetabolic. This of course does not prove that they are not malignant. More importantly, there was a 13.7 cm cystic/solid mass in the upper pelvis associated with adenopathy,. All of this was hypermetabolic."  The patient was evaluated by gynecologic oncology and on 06/27/2014 she underwent surgery under [name]. This consisted of an exploratory laparotomy with hysterectomy and abdominal salpingo-oophorectomy, as well as tumor debulking and omentectomy. 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 The tumor measured 11.5 cm, focally involve the ovarian capsule on the left; the uterus and right ovary and fallopian tube were unremarkable. One para-aortic lymph node was positive out of a total of 6 lymph nodes sampled (2 left common iliac, 2 left para-aortic, and to within the omental resection).  Her subsequent history is as detailed below   PAST MEDICAL HISTORY: Past Medical History:  Diagnosis Date  . Allergy   . Anemia    low iron hx  . Breast cancer ([place]) 2014   a. 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 Cataract    removed ou  . Chronic combined systolic and diastolic CHF (congestive heart failure) ([place])    a. 08/2012 Echo: EF 55-60%, no rwma, mod MR;  b. 07/2014 Echo: EF 45-50%, distal antsept HK, mod TR/MR, mildly bil-atrial enlargement.  . Clear Cell Ovarian Cancer    a. 06/2014 s/p TAH/BSO debulking/lymph node dissection;  b. Now on chemo.  . Depression   . Diabetes mellitus   . DJD (degenerative joint disease) of lumbar spine   . DVT (deep venous thrombosis) (H[facility])    hx of on HRT left leg ~2006  . GERD (gastroesophageal reflux disease)   . Hyperlipidemia   . Hypertension   . Hypothyroidism   . Liver disease, chronic, with cirrhosis ([place])     ? autoimmune  . OSA on CPAP    cpap 4.5 setting  . Paroxysmal atrial fibrillation (H[facility])    a. CHA2DS2VASc = 4-->coumadin.  [name] Physical deconditioning   . PPD positive, treated    rx inh     PAST SURGICAL HISTORY: Past Surgical History:  Procedure Laterality Date  . ABDOMINAL HYSTERECTOMY N/A 06/27/2014   Procedure: HYSTERECTOMY ABDOMINAL TOTAL;  Surgeon: Everitt Amber, MD;  Location: [facility];  Service: Gynecology;  Laterality: N/A;  . BREAST BIOPSY     left breast  . BREAST EXCISIONAL BIOPSY Left 2002  . CATARACT EXTRACTION    . CHOLECYSTECTOMY    . EYE SURGERY    . FOOT SURGERY    . history of chemotherapy x 2 treatments, radiation tx  2014  . LAPAROTOMY N/A 06/27/2014   Procedure: EXPLORATORY LAPAROTOMY;  Surgeon: Everitt Amber, MD;  Location: [facility];  Service: Gynecology;  Laterality: N/A;  . MASTECTOMY Right 2014  . MASTECTOMY MODIFIED RADICAL  06/08/2012   Procedure: MASTECTOMY MODIFIED RADICAL;  Surgeon: Edward Jolly, MD;  Location: [facility];  Service: General;  Laterality: Right;  . pac removed  2015  . PEG TUBE PLACEMENT     peg removed 2015  . PERCUTANEOUS LIVER  BIOPSY    . PORTACATH PLACEMENT  06/08/2012   Procedure: INSERTION PORT-A-CATH;  Surgeon: Edward Jolly, MD;  Location: [facility];  Service: General;  Laterality: Left;  . SALPINGOOPHORECTOMY Bilateral 06/27/2014   Procedure: BILATERAL SALPINGO OOPHORECTOMY/TUMOR DEBULKING/LYMPHNODE DISSECTION, OMENTECTOMY;  Surgeon: Everitt Amber, MD;  Location: [facility];  Service: Gynecology;  Laterality: Bilateral;  . TUBAL LIGATION      FAMILY HISTORY Family History  Problem Relation Age of Onset  . Diabetes Mother   . Hypertension Mother   . Arthritis Mother   . Heart disease Mother   . Heart failure Mother   . Other Mother     benign breast mass  . Stroke Father   . Heart disease Father   . Dementia Father   . Diabetes Paternal Grandmother   . Colon cancer Paternal Grandfather     dx. 70s-80s  . Other Daughter      potentially has had negative BRCA testing  . Other Daughter     hysterectomy; potentially has had negative genetic testing   the patient's father died in his 35s with a history of dementia. He had had prior strokes. The patient's mother died in her 47s, with a history of congestive heart failure. Candyce had no brothers, one sister. There is no history of breast or ovarian cancer in the family.  GYNECOLOGIC HISTORY: Menarche age 21, first live birth age 52, she is GX P2, menopause approximately 15 years ago, on hormone replacement until 2010.  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 Her husband [name] works for [employer]. Daughter Paul Half is a Physiological scientist and lives in [place]. Daughter Santiago Bur and her family (husband and 2 children aged 4 and 3 years) currently live with the patient. Anjelita is a member of a [employer].   ADVANCED DIRECTIVES: Not in place  HEALTH MAINTENANCE: (Updated October 2014) Social History  Substance Use Topics  . Smoking status: Former Smoker    Packs/day: 1.00    Years: 1.00  . Smokeless tobacco: Former Systems developer  . Alcohol use No     Colonoscopy: Never  PAP: Does not recall  Bone density: Never  Lipid panel:   Allergies  Allergen Reactions  . Olmesartan Medoxomil Cough  . Adhesive [Tape] Rash  . Tetracycline Hcl Other (See Comments)  . Venlafaxine Other (See Comments)    severe dry mouth    Current Outpatient Prescriptions  Medication Sig Dispense Refill  . acetaminophen (TYLENOL) 500 MG tablet Take 500 mg by mouth every 6 (six) hours as needed for moderate pain.    [name] ALPRAZolam (XANAX XR) 1 MG 24 hr tablet Take 1 tablet (1 mg total) by mouth 2 (two) times daily. 180 tablet 0  . buPROPion (WELLBUTRIN XL) 150 MG 24 hr tablet TAKE 1 TABLET BY MOUTH  DAILY 90 tablet 1  . clonazePAM (KLONOPIN) 0.5 MG tablet Take 1  tablet (0.5 mg total) by mouth 2 (two) times daily. 180 tablet 0  . escitalopram (LEXAPRO) 20 MG tablet TAKE 1 TABLET BY MOUTH  EVERY MORNING 90 tablet 0  . famotidine (PEPCID) 20 MG tablet Take 1 tablet (20 mg total) by mouth daily. 30 tablet 0  . furosemide (LASIX) 20 MG tablet Take 1 tablet (20 mg total) by mouth daily. 90 tablet 0  . glimepiride (AMARYL) 2 MG tablet Take 1 tablet (2 mg total) by mouth daily  with breakfast. 90 tablet 1  . glucose blood (BAYER CONTOUR NEXT TEST) test strip Use as instructed to check pt's blood sugar twice a day.  DX E11.9 100 each 5  . JANUVIA 100 MG tablet TAKE 1 TABLET BY MOUTH  DAILY 90 tablet 1  . JANUVIA 100 MG tablet TAKE 1 TABLET BY MOUTH DAILY 90 tablet 1  . letrozole (FEMARA) 2.5 MG tablet Take 1 tablet (2.5 mg total) by mouth daily. 90 tablet 4  . levothyroxine (SYNTHROID, LEVOTHROID) 150 MCG tablet TAKE 1 TABLET BY MOUTH  DAILY 90 tablet 1  . lidocaine-prilocaine (EMLA) cream Apply over port area 1-2 hours before chemotherapy 30 g 0  . metFORMIN (GLUCOPHAGE-XR) 500 MG 24 hr tablet TAKE 2 TABLETS BY MOUTH EVERY EVENING 60 tablet 2  . metFORMIN (GLUCOPHAGE-XR) 500 MG 24 hr tablet TAKE 2 TABLETS BY MOUTH  EVERY EVENING 180 tablet 1  . metoprolol (LOPRESSOR) 50 MG tablet Take 1 tablet (50 mg total) by mouth 2 (two) times daily. 180 tablet 1  . metoprolol (LOPRESSOR) 50 MG tablet TAKE 1 TABLET TWICE DAILY 60 tablet 0  . metroNIDAZOLE (METROGEL) 1 % gel Apply topically daily. 45 g 0  . NONFORMULARY OR COMPOUNDED ITEM Cmp, lipid, hgba1c, tsh--- dx , diabetes II, htn, hyperlipidemia, hypothyroidism 1 each 0  . OLANZapine (ZYPREXA) 2.5 MG tablet TAKE 1 TABLET NIGHTLY AT BEDTIME 90 tablet 0  . omeprazole (PRILOSEC) 20 MG capsule TAKE 1 CAPSULE BY MOUTH  DAILY 90 capsule 0  . ondansetron (ZOFRAN) 8 MG tablet Take 1 tablet (8 mg total) by mouth 2 (two) times daily. Start the day after chemo for 2 days. Then take as needed for nausea or vomiting. (Patient not  taking: Reported on 05/09/2016) 30 tablet 1  . ONETOUCH DELICA LANCETS FINE MISC Check blood sugar twice daily. Dx:E11.9 100 each 12  . oxyCODONE-acetaminophen (PERCOCET/ROXICET) 5-325 MG tablet Take 1-2 tablets by mouth every 6 (six) hours as needed for severe pain. 15 tablet 0  . potassium chloride (K-DUR,KLOR-CON) 10 MEQ tablet TAKE 1 TABLET BY MOUTH TWO  TIMES DAILY 180 tablet 1  . pravastatin (PRAVACHOL) 20 MG tablet TAKE 1 TABLET BY MOUTH AT  BEDTIME 90 tablet 1   No current facility-administered medications for this visit.     OBJECTIVE: Middle-aged white woman Who appears stated age 61:   07/29/16 1417  BP: (!) 143/71  Pulse: 79  Resp: 18  Temp: 97.9 F (36.6 C)     Body mass index is 35.36 kg/m.    ECOG FS: 1 Filed Weights   07/29/16 1417  Weight: 212 lb 8 oz (96.4 kg)   Sclerae unicteric, EOMs intact Oropharynx clear and moist No cervical or supraclavicular adenopathy Lungs no rales or rhonchi Heart regular rate and rhythm Abd soft, nontender, positive bowel sounds MSK no focal spinal tenderness, no upper extremity lymphedema Neuro: nonfocal, well oriented, appropriate affect Breasts: The right breast is status post mastectomy with no evidence of chest wall recurrence. Left breast is unremarkable. Both axillae are benign.   Lab Results  Component Value Date   WBC 6.3 07/29/2016   NEUTROABS 4.9 07/29/2016   HGB 13.5 07/29/2016   HCT 40.5 07/29/2016   MCV 97.5 07/29/2016   PLT 97 (L) 07/29/2016     Chemistry      Component Value Date/Time   NA 140 07/29/2016 1220   K 4.3 07/29/2016 1220   CL 107 03/21/2016 0854   CL 104 10/29/2012 1058  CO2 23 07/29/2016 1220   BUN 4.9 (L) 07/29/2016 1220   CREATININE 0.7 07/29/2016 1220      Component Value Date/Time   CALCIUM 8.9 07/29/2016 1220   ALKPHOS 212 (H) 07/29/2016 1220   AST 83 (H) 07/29/2016 1220   ALT 57 (H) 07/29/2016 1220   BILITOT 1.69 (H) 07/29/2016 1220      STUDIES: Nm Pet Image Restag  (ps) Skull Base To Thigh  Result Date: 07/24/2016 CLINICAL DATA:  Subsequent Treatment strategy for right breast cancer. Subsequent treatment strategy for left ovarian cancer. EXAM: NUCLEAR MEDICINE PET SKULL BASE TO THIGH TECHNIQUE: 10.9 mCi F-18 FDG was injected intravenously. Full-ring PET imaging was performed from the skull base to thigh after the radiotracer. CT data was obtained and used for attenuation correction and anatomic localization. FASTING BLOOD GLUCOSE:  Value: 248 mg/dl COMPARISON:  Multiple exams, including 04/29/2016 FINDINGS: NECK No hypermetabolic lymph nodes in the neck. CHEST No hypermetabolic mediastinal or hilar nodes. Stable 5 mm nodule in the right middle lobe on image 33/7. Moderate cardiomegaly. Coronary artery and aortic arch atherosclerotic calcification. Left Port-A-Cath tip:  Lower SVC.  Uphill paraesophageal varices ABDOMEN/PELVIS No abnormal hypermetabolic activity within the liver, pancreas, adrenal glands, or spleen. No hypermetabolic lymph nodes in the abdomen or pelvis. Nodular liver contour suggesting cirrhosis. Left periaortic adenopathy with the dominant lymph node measuring 1.8 cm in short axis on image 128/4 (stable by my measurement) with maximum SUV 4.8 (formerly 10.8 The spleen measures 15.9 by 7.3 by 13.2 cm (volume = 800 cm^3) but is not hypermetabolic. There stranding along the left paracolic gutter with some faint nodularity but without hypermetabolic activity. Aortoiliac atherosclerotic vascular disease. Stable perirectal stranding. SKELETON No focal hypermetabolic activity to suggest skeletal metastasis. Deformity of the left proximal humerus compatible with prior fracture. IMPRESSION: 1. Pathologically enlarged retroperitoneal adenopathy, but with significantly improved metabolic activity compared to the prior exam, as detailed above. No new hypermetabolic activity is observed. 2. Morphologic findings of hepatic cirrhosis. Splenomegaly is present without  splenic hypermetabolic activity. There is stranding and slight hypervascularity along the mesentery and in the pelvis without hypermetabolic tumor implants along the peritoneal margins. 3. Uphill varices adjacent to the distal esophagus. 4. Other imaging findings of potential clinical significance: Moderate cardiomegaly. Aortoiliac atherosclerotic vascular disease. Coronary artery atherosclerosis. Prior fracture the left proximal humerus with healing response. Electronically Signed   By: Van Clines M.D.   On: 07/24/2016 14:57   Mm Screening Breast Tomo Uni L  Result Date: 07/17/2016 CLINICAL DATA:  Screening. EXAM: 2D DIGITAL SCREENING UNILATERAL LEFT MAMMOGRAM WITH CAD AND ADJUNCT TOMO COMPARISON:  Previous exam(s). ACR Breast Density Category c: The breast tissue is heterogeneously dense, which may obscure small masses. FINDINGS: There are no findings suspicious for malignancy. Images were processed with CAD. IMPRESSION: No mammographic evidence of malignancy. A result letter of this screening mammogram will be mailed directly to the patient. RECOMMENDATION: Screening mammogram in one year. (Code:SM-B-01Y) BI-RADS CATEGORY  1: Negative. Electronically Signed   By: Lovey Newcomer M.D.   On: 07/17/2016 08:40     ASSESSMENT: 61 y.o.  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    (2)  treated in the adjuvant setting with docetaxel/ cyclophosphamide given every 3 weeks. There were multiple and severe complications,  and the  patient tolerated only two cycles, last dose 07/29/2012  (3) letrozole started 08/16/2012  (a) osteopenia, with a T score of -1.14 April 2013  (4) postmastectomy radiation completed 12/24/2012  (5)  comorbidities include diabetes, hypertension, chronic  liver disease with cirrhosis and fatty liver, and nutritional disturbance with temporarily dependence on PEG feeds (discontinued July 2014).  (6) restaging studies January 2016 show  (a) 8 mm and 4 mm Right middle lobe lung nodules, not metabolically active on PET -- will require follow-up  (b) hypermetabolic 38.2 cm mixed solid/cystic lesion in upper pelvis, with associated adenopathy  OVARIAN CANCER (7) status post exploratory laparotomy 06/27/2014 with total abdominal hysterectomy, bilateral salpingo-oophorectomy, para-aortic lymphadenectomy, omentectomy and radical tumor debulking for a left-sided clear cell ovarian cancer, pT1c pN1, stage IIIC .  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 despite being on Coumadin for PAF (subtherapeutic INR) with mobile Right common femoral clot  (a) status post thrombolysis 09/30/2014  (b) on Lovenox daily as of 10/02/2014, stopped April 2017  (c) IVC filter placed 10/26/2014 as mobile clot still noted Right groin  (d) IVC filter removed 01/30/2015 with documented resolution of the movable thrombus 01/16/2015  (e) normal quantitative D-dimer 08/06/2015 and 09/24/2015  (10) started abraxane 10/13/2014, repeated every 14 days for 6 cycles (12 doses), completed 03/26/2015  (11) genetics testing December 04, 2014 through the Breast/Ovarian Cancer Panel [facility] no deleterious mutations in ATM, BARD1, BRCA1, BRCA2, BRIP1, CDH1, CHEK2, FANCC, MLH1, MSH2, MSH6, NBN, PALB2, PMS2, PTEN, RAD51C, RAD51D, STK11, TP53, and XRCC2.    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 The main years though is that her PET scan shows no evidence of recurrence of either her breast cancer or her ovarian cancer. In particular she is now 2 years out from definitive surgery for her ovarian cancer and this is very favorable.  She is continuing letrozole with good tolerance. The plan is to continue for a total of 7 years.  She does have a yeast infection peripherally involving the left chest axilla. I went ahead and prescribed ketoconazole cream for her. She will let me know if that does not work.  I am concerned by her worsening splenomegaly and steatosis with cirrhosis. She currently does not have gastroenterology follow up since her gastroenterologist Dr. Maurene Capes retired. She also has never had a colonoscopy and placing a referral so she can be advised and followed regarding these problems.  Of course the reason for her mild seroma cytopenia is splenomegaly.  I offered to have her port removed what she would like to keep it. This means we will be flushing it every 2 months and this means we will be checking labs every 2 months, including a CA-125..  Aside from that she will see me again in 6 months. She knows to call for any other problems that may develop before then.  Chauncey Cruel, MD    07/29/2016

## 2016-07-29 NOTE — Patient Instructions (Signed)
Implanted Port Home Guide An implanted port is a type of central line that is placed under the skin. Central lines are used to provide IV access when treatment or nutrition needs to be given through a person's veins. Implanted ports are used for long-term IV access. An implanted port may be placed because:  You need IV medicine that would be irritating to the small veins in your hands or arms.  You need long-term IV medicines, such as antibiotics.  You need IV nutrition for a long period.  You need frequent blood draws for lab tests.  You need dialysis.  Implanted ports are usually placed in the chest area, but they can also be placed in the upper arm, the abdomen, or the leg. An implanted port has two main parts:  Reservoir. The reservoir is round and will appear as a small, raised area under your skin. The reservoir is the part where a needle is inserted to give medicines or draw blood.  Catheter. The catheter is a thin, flexible tube that extends from the reservoir. The catheter is placed into a large vein. Medicine that is inserted into the reservoir goes into the catheter and then into the vein.  How will I care for my incision site? Do not get the incision site wet. Bathe or shower as directed by your health care provider. How is my port accessed? Special steps must be taken to access the port:  Before the port is accessed, a numbing cream can be placed on the skin. This helps numb the skin over the port site.  Your health care provider uses a sterile technique to access the port. ? Your health care provider must put on a mask and sterile gloves. ? The skin over your port is cleaned carefully with an antiseptic and allowed to dry. ? The port is gently pinched between sterile gloves, and a needle is inserted into the port.  Only "non-coring" port needles should be used to access the port. Once the port is accessed, a blood return should be checked. This helps ensure that the port  is in the vein and is not clogged.  If your port needs to remain accessed for a constant infusion, a clear (transparent) bandage will be placed over the needle site. The bandage and needle will need to be changed every week, or as directed by your health care provider.  Keep the bandage covering the needle clean and dry. Do not get it wet. Follow your health care provider's instructions on how to take a shower or bath while the port is accessed.  If your port does not need to stay accessed, no bandage is needed over the port.  What is flushing? Flushing helps keep the port from getting clogged. Follow your health care provider's instructions on how and when to flush the port. Ports are usually flushed with saline solution or a medicine called heparin. The need for flushing will depend on how the port is used.  If the port is used for intermittent medicines or blood draws, the port will need to be flushed: ? After medicines have been given. ? After blood has been drawn. ? As part of routine maintenance.  If a constant infusion is running, the port may not need to be flushed.  How long will my port stay implanted? The port can stay in for as long as your health care provider thinks it is needed. When it is time for the port to come out, surgery will be   done to remove it. The procedure is similar to the one performed when the port was put in. When should I seek immediate medical care? When you have an implanted port, you should seek immediate medical care if:  You notice a bad smell coming from the incision site.  You have swelling, redness, or drainage at the incision site.  You have more swelling or pain at the port site or the surrounding area.  You have a fever that is not controlled with medicine.  This information is not intended to replace advice given to you by your health care provider. Make sure you discuss any questions you have with your health care provider. Document  Released: 04/28/2005 Document Revised: 10/04/2015 Document Reviewed: 01/03/2013 Elsevier Interactive Patient Education  2017 Elsevier Inc.  

## 2016-07-30 LAB — CA 125: CANCER ANTIGEN (CA) 125: 51.9 U/mL — AB (ref 0.0–38.1)

## 2016-08-03 ENCOUNTER — Other Ambulatory Visit: Payer: Self-pay | Admitting: Family Medicine

## 2016-08-04 ENCOUNTER — Other Ambulatory Visit: Payer: Self-pay | Admitting: *Deleted

## 2016-08-06 ENCOUNTER — Other Ambulatory Visit: Payer: Self-pay | Admitting: *Deleted

## 2016-08-06 MED ORDER — CLONAZEPAM 0.5 MG PO TABS: 0.5000 mg | ORAL_TABLET | Freq: Two times a day (BID) | ORAL | 0 refills | Status: DC

## 2016-08-13 ENCOUNTER — Other Ambulatory Visit: Payer: Self-pay | Admitting: Emergency Medicine

## 2016-08-13 MED ORDER — ALPRAZOLAM 0.5 MG PO TABS: 0.5000 mg | ORAL_TABLET | Freq: Two times a day (BID) | ORAL | 0 refills | Status: DC | PRN

## 2016-08-13 NOTE — Telephone Encounter (Signed)
Call received from patient's spouse requesting a "different medication" due to cost. Mr [name] states that the Xanax XR is now approximately $200 for a 3 month supply. Changed Rx to Xanax 0.5-1mg  BID; patient will call if there are any issues with this rx.

## 2016-08-22 ENCOUNTER — Other Ambulatory Visit: Payer: Self-pay | Admitting: Family Medicine

## 2016-08-22 MED ORDER — GLIMEPIRIDE 2 MG PO TABS: 2.0000 mg | ORAL_TABLET | Freq: Two times a day (BID) | ORAL | 0 refills | Status: DC

## 2016-08-22 NOTE — Telephone Encounter (Signed)
Advise on lower cost co pay

## 2016-08-22 NOTE — Telephone Encounter (Signed)
Not sure if the note on the RX is still relevant from the pharmacy. It says she cannot afford the Januvia. If so have her increase her Glimeperide to 2mg  po bid til her PMD gets back and they she can address. Patient does need appt for labs and follow up soon to discuss options

## 2016-08-22 NOTE — Telephone Encounter (Signed)
Spoke to the patient and her husband and informed of dr. Frederik Pear suggestion. They agreed to now to increase the amaryl and sent in to mail order. They can discuss with PCP  At next appt.

## 2016-08-24 ENCOUNTER — Other Ambulatory Visit: Payer: Self-pay | Admitting: Family Medicine

## 2016-08-24 DIAGNOSIS — K219 Gastro-esophageal reflux disease without esophagitis: Secondary | ICD-10-CM

## 2016-09-01 ENCOUNTER — Ambulatory Visit: Payer: Medicare Other | Admitting: Gastroenterology

## 2016-09-01 LAB — HM DIABETES EYE EXAM

## 2016-09-04 ENCOUNTER — Telehealth: Payer: Self-pay

## 2016-09-04 NOTE — Telephone Encounter (Signed)
Received refill request from [facility] for alprazolam refill. Called pt and talked with her husband. He has a bottle of alprazolam ER 1 mg tabs 1 tab BID PRN. He gives them to her BID. On MAR is written alprazolam 0.5 mg 1-2 tabs BID PRN. He states they have plenty for now.   This RN requested he call [facility] when he is needing a refill. This RN also instructed that it is an AS NEEDED prescription and if she is doing well, not anxious, he does not need to give them to her on a scheduled basis.   He is requesting that the new Rx NOT be for extended release b/c they cost too much.

## 2016-09-06 ENCOUNTER — Other Ambulatory Visit: Payer: Self-pay | Admitting: Family Medicine

## 2016-09-06 ENCOUNTER — Other Ambulatory Visit: Payer: Self-pay | Admitting: Oncology

## 2016-09-18 ENCOUNTER — Ambulatory Visit: Payer: Medicare Other | Admitting: Gastroenterology

## 2016-09-23 ENCOUNTER — Other Ambulatory Visit (HOSPITAL_BASED_OUTPATIENT_CLINIC_OR_DEPARTMENT_OTHER): Payer: Medicare Other

## 2016-09-23 ENCOUNTER — Ambulatory Visit (HOSPITAL_BASED_OUTPATIENT_CLINIC_OR_DEPARTMENT_OTHER): Payer: Medicare Other

## 2016-09-23 DIAGNOSIS — C569 Malignant neoplasm of unspecified ovary: Secondary | ICD-10-CM

## 2016-09-23 DIAGNOSIS — Z95828 Presence of other vascular implants and grafts: Secondary | ICD-10-CM

## 2016-09-23 DIAGNOSIS — C50811 Malignant neoplasm of overlapping sites of right female breast: Secondary | ICD-10-CM

## 2016-09-23 DIAGNOSIS — C562 Malignant neoplasm of left ovary: Secondary | ICD-10-CM

## 2016-09-23 LAB — CBC WITH DIFFERENTIAL/PLATELET
BASO%: 0.5 % (ref 0.0–2.0)
Basophils Absolute: 0 10*3/uL (ref 0.0–0.1)
EOS%: 2.2 % (ref 0.0–7.0)
Eosinophils Absolute: 0.1 10*3/uL (ref 0.0–0.5)
HEMATOCRIT: 39.7 % (ref 34.8–46.6)
HEMOGLOBIN: 13 g/dL (ref 11.6–15.9)
LYMPH#: 0.9 10*3/uL (ref 0.9–3.3)
LYMPH%: 21.1 % (ref 14.0–49.7)
MCH: 32 pg (ref 25.1–34.0)
MCHC: 32.8 g/dL (ref 31.5–36.0)
MCV: 97.4 fL (ref 79.5–101.0)
MONO#: 0.4 10*3/uL (ref 0.1–0.9)
MONO%: 9.6 % (ref 0.0–14.0)
NEUT%: 66.6 % (ref 38.4–76.8)
NEUTROS ABS: 2.7 10*3/uL (ref 1.5–6.5)
PLATELETS: 116 10*3/uL — AB (ref 145–400)
RBC: 4.08 10*6/uL (ref 3.70–5.45)
RDW: 15.1 % — AB (ref 11.2–14.5)
WBC: 4.1 10*3/uL (ref 3.9–10.3)

## 2016-09-23 LAB — COMPREHENSIVE METABOLIC PANEL
ALT: 42 U/L (ref 0–55)
ANION GAP: 13 meq/L — AB (ref 3–11)
AST: 95 U/L — AB (ref 5–34)
Albumin: 2.8 g/dL — ABNORMAL LOW (ref 3.5–5.0)
Alkaline Phosphatase: 174 U/L — ABNORMAL HIGH (ref 40–150)
BUN: 5 mg/dL — ABNORMAL LOW (ref 7.0–26.0)
CHLORIDE: 110 meq/L — AB (ref 98–109)
CO2: 22 meq/L (ref 22–29)
Calcium: 8.5 mg/dL (ref 8.4–10.4)
Creatinine: 0.6 mg/dL (ref 0.6–1.1)
EGFR: >90 mL/min/{1.73_m2} (ref >90)
Glucose: 137 mg/dl (ref 70–140)
POTASSIUM: 3.4 meq/L — AB (ref 3.5–5.1)
Sodium: 144 mEq/L (ref 136–145)
Total Bilirubin: 1.76 mg/dL — ABNORMAL HIGH (ref 0.20–1.20)
Total Protein: 6.1 g/dL — ABNORMAL LOW (ref 6.4–8.3)

## 2016-09-23 MED ORDER — HEPARIN SOD (PORK) LOCK FLUSH 100 UNIT/ML IV SOLN
500.0000 [IU] | Freq: Once | INTRAVENOUS | Status: AC | PRN
  Administered 2016-09-23: 500 [IU]
  Filled 2016-09-23: qty 5

## 2016-09-23 MED ORDER — SODIUM CHLORIDE 0.9 % IJ SOLN
10.0000 mL | INTRAMUSCULAR | Status: DC | PRN
  Administered 2016-09-23: 10 mL
  Filled 2016-09-23: qty 10

## 2016-09-23 NOTE — Patient Instructions (Signed)

## 2016-09-24 LAB — CA 125: CANCER ANTIGEN (CA) 125: 70.7 U/mL — AB (ref 0.0–38.1)

## 2016-10-07 ENCOUNTER — Other Ambulatory Visit: Payer: Self-pay | Admitting: Oncology

## 2016-10-07 DIAGNOSIS — C50811 Malignant neoplasm of overlapping sites of right female breast: Secondary | ICD-10-CM

## 2016-10-07 DIAGNOSIS — Z170 Estrogen receptor positive status [ER+]: Principal | ICD-10-CM

## 2016-10-07 DIAGNOSIS — C562 Malignant neoplasm of left ovary: Secondary | ICD-10-CM

## 2016-10-07 NOTE — Progress Notes (Unsigned)
[name] continues to trend up. While this is not very marked it is steady and I'm going to restage her with a CT of the abdomen and pelvis and a chest x-ray to be obtained June 8. She will see me the following week. I called and left her a phone message letting her know this.

## 2016-10-08 ENCOUNTER — Encounter: Payer: Self-pay | Admitting: *Deleted

## 2016-10-10 ENCOUNTER — Ambulatory Visit (INDEPENDENT_AMBULATORY_CARE_PROVIDER_SITE_OTHER): Payer: Medicare Other | Admitting: Family Medicine

## 2016-10-10 ENCOUNTER — Encounter: Payer: Self-pay | Admitting: Family Medicine

## 2016-10-10 VITALS — BP 106/70 | HR 67 | Temp 98.1°F | Resp 16 | Wt 211.6 lb

## 2016-10-10 DIAGNOSIS — E1121 Type 2 diabetes mellitus with diabetic nephropathy: Secondary | ICD-10-CM | POA: Diagnosis not present

## 2016-10-10 DIAGNOSIS — E039 Hypothyroidism, unspecified: Secondary | ICD-10-CM | POA: Diagnosis not present

## 2016-10-10 DIAGNOSIS — I10 Essential (primary) hypertension: Secondary | ICD-10-CM

## 2016-10-10 DIAGNOSIS — E1165 Type 2 diabetes mellitus with hyperglycemia: Secondary | ICD-10-CM | POA: Diagnosis not present

## 2016-10-10 DIAGNOSIS — E559 Vitamin D deficiency, unspecified: Secondary | ICD-10-CM

## 2016-10-10 DIAGNOSIS — IMO0002 Reserved for concepts with insufficient information to code with codable children: Secondary | ICD-10-CM

## 2016-10-10 DIAGNOSIS — I5032 Chronic diastolic (congestive) heart failure: Secondary | ICD-10-CM | POA: Diagnosis not present

## 2016-10-10 DIAGNOSIS — E785 Hyperlipidemia, unspecified: Secondary | ICD-10-CM

## 2016-10-10 DIAGNOSIS — E1151 Type 2 diabetes mellitus with diabetic peripheral angiopathy without gangrene: Secondary | ICD-10-CM | POA: Diagnosis not present

## 2016-10-10 LAB — LIPID PANEL
Cholesterol: 135 mg/dL (ref 0–200)
HDL: 30.2 mg/dL — ABNORMAL LOW (ref >39.00)
LDL Cholesterol: 83 mg/dL (ref 0–99)
NonHDL: 104.9
Total CHOL/HDL Ratio: 4
Triglycerides: 110 mg/dL (ref 0.0–149.0)
VLDL: 22 mg/dL (ref 0.0–40.0)

## 2016-10-10 LAB — VITAMIN D 25 HYDROXY (VIT D DEFICIENCY, FRACTURES): VITD: 46.66 ng/mL (ref 30.00–100.00)

## 2016-10-10 LAB — TSH: TSH: 1.92 u[IU]/mL (ref 0.35–4.50)

## 2016-10-10 LAB — HEMOGLOBIN A1C: Hgb A1c MFr Bld: 5.4 % (ref 4.6–6.5)

## 2016-10-10 NOTE — Assessment & Plan Note (Signed)
Tolerating statin, encouraged heart healthy diet, avoid trans fats, minimize simple carbs and saturated fats. Increase exercise as tolerated 

## 2016-10-10 NOTE — Progress Notes (Signed)
Patient ID: [name], female   DOB: May 21, 1955, 61 y.o.   MRN: [id number]     Subjective:  I acted as a [name] for Dr. Carollee Herter.  [name], [name]    Patient ID: [name], female    DOB: Jul 24, 1955, 61 y.o.   MRN: [id number]  Chief Complaint  Patient presents with  . Diabetes  . Hyperlipidemia    HPI  Patient is in today for follow up diabetes and cholesterol.  She is doing well on current treatment.  HPI HYPERTENSION   Blood pressure range-not checking   Chest pain- no      Dyspnea- no Lightheadedness- no   Edema- no  Other side effects - no   Medication compliance: good Low salt diet- yes    DIABETES    Blood Sugar ranges-not checking Polyuria- no New Visual problems- no  Hypoglycemic symptoms- no  Other side effects-no Medication compliance -  Last eye exam- 10/23/16 Foot exam- today   HYPERLIPIDEMIA  Medication compliance- good  RUQ pain- no  Muscle aches- no Other side effects-no     Patient Care Team: Ann Held, DO as PCP - General (Family Medicine) Magrinat, Virgie Dad, MD as Consulting Physician (Oncology) Excell Seltzer, MD as Consulting Physician (General Surgery) Everitt Amber, MD as Consulting Physician (Obstetrics and Gynecology) Gaynelle Arabian, MD as Consulting Physician (Orthopedic Surgery) Nicholes Stairs, MD as Consulting Physician (Orthopedic Surgery)   Past Medical History:  Diagnosis Date  . Allergy   . Anemia    low iron hx  . Breast cancer ([facility]) 2014   a. 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 Cataract    removed ou  . Chronic combined systolic and diastolic CHF (congestive heart failure) ([place])    a. 08/2012 Echo: EF 55-60%, no rwma, mod MR;  b. 07/2014 Echo: EF 45-50%, distal antsept HK, mod TR/MR, mildly bil-atrial enlargement.  . Clear Cell Ovarian Cancer    a. 06/2014 s/p TAH/BSO  debulking/lymph node dissection;  b. Now on chemo.  . Depression   . Diabetes mellitus   . DJD (degenerative joint disease) of lumbar spine   . DVT (deep venous thrombosis) ([facility])    hx of on HRT left leg ~2006  . GERD (gastroesophageal reflux disease)   . Hyperlipidemia   . Hypertension   . Hypothyroidism   . Liver disease, chronic, with cirrhosis ([place])    ? autoimmune  . OSA on CPAP    cpap 4.5 setting  . Paroxysmal atrial fibrillation ([facility])    a. CHA2DS2VASc = 4-->coumadin.  [name] Kitchen Physical deconditioning   . PPD positive, treated    rx inh     Past Surgical History:  Procedure Laterality Date  . ABDOMINAL HYSTERECTOMY N/A 06/27/2014   Procedure: HYSTERECTOMY ABDOMINAL TOTAL;  Surgeon: Everitt Amber, MD;  Location: [facility];  Service: Gynecology;  Laterality: N/A;  . BREAST BIOPSY     left breast  . BREAST EXCISIONAL BIOPSY Left 2002  . CATARACT EXTRACTION    . CHOLECYSTECTOMY    . EYE SURGERY    . FOOT SURGERY    . history of chemotherapy x 2 treatments, radiation tx  2014  . LAPAROTOMY N/A 06/27/2014   Procedure: EXPLORATORY LAPAROTOMY;  Surgeon: Everitt Amber, MD;  Location: [facility];  Service: Gynecology;  Laterality: N/A;  . MASTECTOMY Right 2014  . MASTECTOMY MODIFIED RADICAL  06/08/2012  Procedure: MASTECTOMY MODIFIED RADICAL;  Surgeon: Edward Jolly, MD;  Location: [facility];  Service: General;  Laterality: Right;  . pac removed  2015  . PEG TUBE PLACEMENT     peg removed 2015  . PERCUTANEOUS LIVER BIOPSY    . PORTACATH PLACEMENT  06/08/2012   Procedure: INSERTION PORT-A-CATH;  Surgeon: Edward Jolly, MD;  Location: [facility];  Service: General;  Laterality: Left;  . SALPINGOOPHORECTOMY Bilateral 06/27/2014   Procedure: BILATERAL SALPINGO OOPHORECTOMY/TUMOR DEBULKING/LYMPHNODE DISSECTION, OMENTECTOMY;  Surgeon: Everitt Amber, MD;  Location: [facility];  Service: Gynecology;  Laterality: Bilateral;  . TUBAL LIGATION      Family History  Problem Relation Age of Onset  . Diabetes  Mother   . Hypertension Mother   . Arthritis Mother   . Heart disease Mother   . Heart failure Mother   . Other Mother        benign breast mass  . Stroke Father   . Heart disease Father   . Dementia Father   . Diabetes Paternal Grandmother   . Colon cancer Paternal Grandfather        dx. 70s-80s  . Other Daughter        potentially has had negative BRCA testing  . Other Daughter        hysterectomy; potentially has had negative genetic testing    Social History   Social History  . Marital status: Married    Spouse name: N/A  . Number of children: 2  . Years of Rhonda: N/A   Occupational History  . [place] History Main Topics  . Smoking status: Former Smoker    Packs/day: 1.00    Years: 1.00  . Smokeless tobacco: Former Systems developer  . Alcohol use No  . Drug use: No     Comment: quit age 77 only smoked as a teen 1 year  . Sexual activity: No   Other Topics Concern  . Not on file   Social History Narrative   Married   Works at [employer] secretary   Daily caffeine use - 2 cups a day plus a couple of sodas a day   Pt doesn't exercise regularly   G2P2   H H of 5 soon to be 6 .     Outpatient Medications Prior to Visit  Medication Sig Dispense Refill  . acetaminophen (TYLENOL) 500 MG tablet Take 500 mg by mouth every 6 (six) hours as needed for moderate pain.    [name] Kitchen ALPRAZolam (XANAX) 0.5 MG tablet Take 1-2 tablets (0.5-1 mg total) by mouth 2 (two) times daily as needed for anxiety. 180 tablet 0  . buPROPion (WELLBUTRIN XL) 150 MG 24 hr tablet TAKE 1 TABLET BY MOUTH  DAILY 90 tablet 1  . clonazePAM (KLONOPIN) 0.5 MG tablet Take 1 tablet (0.5 mg total) by mouth 2 (two) times daily. 180 tablet 0  . escitalopram (LEXAPRO) 20 MG tablet TAKE 1 TABLET BY MOUTH  EVERY MORNING 90 tablet 1  . famotidine (PEPCID) 20 MG tablet Take 1 tablet (20 mg total) by mouth daily. 30 tablet 0  . furosemide (LASIX) 20 MG tablet TAKE 1 TABLET BY MOUTH  DAILY 90  tablet 0  . glimepiride (AMARYL) 2 MG tablet Take 1 tablet (2 mg total) by mouth 2 (two) times daily. 180 tablet 0  . glucose blood (BAYER CONTOUR NEXT TEST) test strip Use as instructed to check pt's blood sugar twice a day.  DX E11.9  100 each 5  . JANUVIA 100 MG tablet TAKE 1 TABLET BY MOUTH DAILY 90 tablet 1  . ketoconazole (NIZORAL) 2 % cream Apply 1 application topically daily. 15 g 3  . letrozole (FEMARA) 2.5 MG tablet Take 1 tablet (2.5 mg total) by mouth daily. 90 tablet 4  . levothyroxine (SYNTHROID, LEVOTHROID) 150 MCG tablet TAKE 1 TABLET BY MOUTH  DAILY 90 tablet 0  . lidocaine-prilocaine (EMLA) cream Apply over port area 1-2 hours before chemotherapy 30 g 0  . metFORMIN (GLUCOPHAGE-XR) 500 MG 24 hr tablet TAKE 2 TABLETS BY MOUTH EVERY EVENING 60 tablet 2  . metoprolol (LOPRESSOR) 50 MG tablet TAKE 1 TABLET TWICE DAILY 60 tablet 0  . metroNIDAZOLE (METROGEL) 1 % gel Apply topically daily. 45 g 0  . NONFORMULARY OR COMPOUNDED ITEM Cmp, lipid, hgba1c, tsh--- dx , diabetes II, htn, hyperlipidemia, hypothyroidism 1 each 0  . OLANZapine (ZYPREXA) 2.5 MG tablet TAKE 1 TABLET NIGHTLY AT  BEDTIME 90 tablet 1  . omeprazole (PRILOSEC) 20 MG capsule TAKE 1 CAPSULE BY MOUTH  DAILY 90 capsule 1  . ONETOUCH DELICA LANCETS FINE MISC Check blood sugar twice daily. Dx:E11.9 100 each 12  . potassium chloride (K-DUR,KLOR-CON) 10 MEQ tablet TAKE 1 TABLET BY MOUTH TWO  TIMES DAILY 180 tablet 1  . pravastatin (PRAVACHOL) 20 MG tablet TAKE 1 TABLET BY MOUTH AT  BEDTIME 90 tablet 1   No facility-administered medications prior to visit.     Allergies  Allergen Reactions  . Olmesartan Medoxomil Cough  . Adhesive [Tape] Rash  . Tetracycline Hcl Other (See Comments)  . Venlafaxine Other (See Comments)    severe dry mouth    Review of Systems  Constitutional: Negative for fever and malaise/fatigue.  HENT: Negative for congestion.   Eyes: Negative for blurred vision.  Respiratory: Negative for cough  and shortness of breath.   Cardiovascular: Negative for chest pain, palpitations and leg swelling.  Gastrointestinal: Negative for vomiting.  Musculoskeletal: Negative for back pain.  Skin: Negative for rash.  Neurological: Negative for loss of consciousness and headaches.       Objective:    Physical Exam  Constitutional: She is oriented to person, place, and time. She appears well-developed and well-nourished. No distress.  HENT:  Head: Normocephalic and atraumatic.  Eyes: Conjunctivae are normal.  Neck: Normal range of motion. No thyromegaly present.  Cardiovascular: Normal rate and regular rhythm.   Pulmonary/Chest: Effort normal and breath sounds normal. She has no wheezes.  Abdominal: Soft. Bowel sounds are normal. There is no tenderness.  Musculoskeletal: Normal range of motion. She exhibits no edema or deformity.  Neurological: She is alert and oriented to person, place, and time.  Skin: Skin is warm and dry. She is not diaphoretic.  Psychiatric: She has a normal mood and affect.    BP 106/70 (BP Location: Left Arm, Cuff Size: Large)   Pulse 67   Temp 98.1 F (36.7 C) (Oral)   Resp 16   Wt 211 lb 9.6 oz (96 kg)   SpO2 96%   BMI 35.21 kg/m  Wt Readings from Last 3 Encounters:  10/10/16 211 lb 9.6 oz (96 kg)  07/29/16 212 lb 8 oz (96.4 kg)  05/23/16 220 lb (99.8 kg)   BP Readings from Last 3 Encounters:  10/10/16 106/70  07/29/16 (!) 143/71  05/21/16 149/69     Immunization History  Administered Date(s) Administered  . Hepatitis A 04/03/2009, 03/27/2010  . Hepatitis B 07/01/2001, 08/08/2001, 01/17/2002  .  Influenza Split 03/29/2012  . Influenza,inj,Quad PF,36+ Mos 02/02/2013, 01/24/2014, 04/24/2015, 01/28/2016  . Pneumococcal Polysaccharide-23 11/22/2008, 05/15/2014  . Td 05/12/2006    Health Maintenance  Topic Date Due  . TETANUS/TDAP  05/12/2016  . COLONOSCOPY  05/09/2017 (Originally 03/02/2004)  . HIV Screening  05/09/2017 (Originally [birth year]02/1971)    . URINE MICROALBUMIN  10/25/2016  . INFLUENZA VACCINE  12/10/2016  . FOOT EXAM  01/27/2017  . HEMOGLOBIN A1C  04/11/2017  . OPHTHALMOLOGY EXAM  09/01/2017  . MAMMOGRAM  07/17/2018  . PNEUMOCOCCAL POLYSACCHARIDE VACCINE  Completed  . Hepatitis C Screening  Completed    Lab Results  Component Value Date   WBC 4.1 09/23/2016   HGB 13.0 09/23/2016   HCT 39.7 09/23/2016   PLT 116 (L) 09/23/2016   GLUCOSE 137 09/23/2016   CHOL 135 10/10/2016   TRIG 110.0 10/10/2016   HDL 30.20 (L) 10/10/2016   LDLDIRECT 160.2 09/23/2010   LDLCALC 83 10/10/2016   ALT 42 09/23/2016   AST 95 (H) 09/23/2016   NA 144 09/23/2016   K 3.4 (L) 09/23/2016   CL 107 03/21/2016   CREATININE 0.6 09/23/2016   BUN 5.0 (L) 09/23/2016   CO2 22 09/23/2016   TSH 1.92 10/10/2016   INR 1.29 02/21/2015   HGBA1C 5.4 10/10/2016   MICROALBUR 3.7 (H) 10/26/2015    Lab Results  Component Value Date   TSH 1.92 10/10/2016   Lab Results  Component Value Date   WBC 4.1 09/23/2016   HGB 13.0 09/23/2016   HCT 39.7 09/23/2016   MCV 97.4 09/23/2016   PLT 116 (L) 09/23/2016   Lab Results  Component Value Date   NA 144 09/23/2016   K 3.4 (L) 09/23/2016   CHLORIDE 110 (H) 09/23/2016   CO2 22 09/23/2016   GLUCOSE 137 09/23/2016   BUN 5.0 (L) 09/23/2016   CREATININE 0.6 09/23/2016   BILITOT 1.76 (H) 09/23/2016   ALKPHOS 174 (H) 09/23/2016   AST 95 (H) 09/23/2016   ALT 42 09/23/2016   PROT 6.1 (L) 09/23/2016   ALBUMIN 2.8 (L) 09/23/2016   CALCIUM 8.5 09/23/2016   ANIONGAP 13 (H) 09/23/2016   EGFR >90 09/23/2016   GFR 121.56 03/21/2016   Lab Results  Component Value Date   CHOL 135 10/10/2016   Lab Results  Component Value Date   HDL 30.20 (L) 10/10/2016   Lab Results  Component Value Date   LDLCALC 83 10/10/2016   Lab Results  Component Value Date   TRIG 110.0 10/10/2016   Lab Results  Component Value Date   CHOLHDL 4 10/10/2016   Lab Results  Component Value Date   HGBA1C 5.4 10/10/2016          Assessment & Plan:   Problem List Items Addressed This Visit      Unprioritized   Chronic diastolic CHF (congestive heart failure), NYHA class 1 ([place])    Per cardiology      DM (diabetes mellitus) type II uncontrolled, periph vascular disorder ([facility]) - Primary    hgba1c to be done, minimize simple carbs. Increase exercise as tolerated. Continue current meds      Relevant Orders   Hemoglobin A1c (Completed)   DM (diabetes mellitus), type 2, uncontrolled, with renal complications ([place])    [id number] to be done, minimize simple carbs. Increase exercise as tolerated. Continue current meds       Essential hypertension    Well controlled, no changes to meds. Encouraged heart healthy diet such as the DASH diet  and exercise as tolerated.       Hyperlipidemia LDL goal <70    Tolerating statin, encouraged heart healthy diet, avoid trans fats, minimize simple carbs and saturated fats. Increase exercise as tolerated      Relevant Orders   Lipid panel (Completed)   Hypothyroidism    con't meds Check labs      Relevant Orders   TSH (Completed)    Other Visit Diagnoses    Vitamin D deficiency       Relevant Orders   Vitamin D (25 hydroxy) (Completed)      I am having Ms. Colborn maintain her famotidine, acetaminophen, metroNIDAZOLE, ONETOUCH DELICA LANCETS FINE, glucose blood, NONFORMULARY OR COMPOUNDED ITEM, lidocaine-prilocaine, metFORMIN, pravastatin, buPROPion, metoprolol tartrate, potassium chloride, JANUVIA, letrozole, ketoconazole, levothyroxine, OLANZapine, clonazePAM, ALPRAZolam, glimepiride, omeprazole, escitalopram, and furosemide.  No orders of the defined types were placed in this encounter.   CMA served as [name] during this visit. History, Physical and Plan performed by medical provider. Documentation and orders reviewed and attested to.  Ann Held, DO

## 2016-10-10 NOTE — Assessment & Plan Note (Signed)
hgba1c to be done, minimize simple carbs. Increase exercise as tolerated. Continue current meds  

## 2016-10-10 NOTE — Patient Instructions (Signed)

## 2016-10-10 NOTE — Assessment & Plan Note (Signed)
con't meds  Check labs 

## 2016-10-10 NOTE — Assessment & Plan Note (Signed)
Per cardiology 

## 2016-10-10 NOTE — Assessment & Plan Note (Signed)
Well controlled, no changes to meds. Encouraged heart healthy diet such as the DASH diet and exercise as tolerated.  °

## 2016-10-11 ENCOUNTER — Other Ambulatory Visit: Payer: Self-pay | Admitting: Family Medicine

## 2016-10-17 ENCOUNTER — Ambulatory Visit (HOSPITAL_COMMUNITY)
Admission: RE | Admit: 2016-10-17 | Discharge: 2016-10-17 | Disposition: A | Discharge status: Home / Self Care | Payer: Medicare Other | Source: Ambulatory Visit | Attending: Oncology | Admitting: Oncology

## 2016-10-17 ENCOUNTER — Other Ambulatory Visit: Payer: Self-pay | Admitting: Oncology

## 2016-10-17 ENCOUNTER — Encounter (HOSPITAL_COMMUNITY): Payer: Self-pay

## 2016-10-17 DIAGNOSIS — R590 Localized enlarged lymph nodes: Secondary | ICD-10-CM | POA: Insufficient documentation

## 2016-10-17 DIAGNOSIS — Z170 Estrogen receptor positive status [ER+]: Secondary | ICD-10-CM | POA: Insufficient documentation

## 2016-10-17 DIAGNOSIS — I700 Atherosclerosis of aorta: Secondary | ICD-10-CM | POA: Diagnosis not present

## 2016-10-17 DIAGNOSIS — K7460 Unspecified cirrhosis of liver: Secondary | ICD-10-CM | POA: Insufficient documentation

## 2016-10-17 DIAGNOSIS — J984 Other disorders of lung: Secondary | ICD-10-CM | POA: Insufficient documentation

## 2016-10-17 DIAGNOSIS — C50811 Malignant neoplasm of overlapping sites of right female breast: Secondary | ICD-10-CM | POA: Diagnosis present

## 2016-10-17 DIAGNOSIS — C562 Malignant neoplasm of left ovary: Secondary | ICD-10-CM | POA: Diagnosis not present

## 2016-10-17 DIAGNOSIS — R932 Abnormal findings on diagnostic imaging of liver and biliary tract: Secondary | ICD-10-CM | POA: Diagnosis not present

## 2016-10-17 DIAGNOSIS — K769 Liver disease, unspecified: Principal | ICD-10-CM

## 2016-10-17 MED ORDER — HEPARIN SOD (PORK) LOCK FLUSH 100 UNIT/ML IV SOLN
500.0000 [IU] | Freq: Once | INTRAVENOUS | Status: AC
  Administered 2016-10-17: 500 [IU] via INTRAVENOUS

## 2016-10-17 MED ORDER — IOPAMIDOL (ISOVUE-300) INJECTION 61%
INTRAVENOUS | Status: AC
  Filled 2016-10-17: qty 100

## 2016-10-17 MED ORDER — IOPAMIDOL (ISOVUE-300) INJECTION 61%
100.0000 mL | Freq: Once | INTRAVENOUS | Status: AC | PRN
  Administered 2016-10-17: 100 mL via INTRAVENOUS

## 2016-10-17 MED ORDER — HEPARIN SOD (PORK) LOCK FLUSH 100 UNIT/ML IV SOLN
INTRAVENOUS | Status: AC
  Filled 2016-10-17: qty 5

## 2016-10-21 ENCOUNTER — Encounter: Payer: Self-pay | Admitting: Gastroenterology

## 2016-10-21 ENCOUNTER — Ambulatory Visit (HOSPITAL_BASED_OUTPATIENT_CLINIC_OR_DEPARTMENT_OTHER): Payer: Medicare Other | Admitting: Oncology

## 2016-10-21 ENCOUNTER — Ambulatory Visit (HOSPITAL_BASED_OUTPATIENT_CLINIC_OR_DEPARTMENT_OTHER): Payer: Medicare Other

## 2016-10-21 ENCOUNTER — Encounter: Payer: Self-pay | Admitting: Oncology

## 2016-10-21 VITALS — BP 158/84 | HR 71 | Temp 98.1°F | Resp 17 | Ht 65.0 in | Wt 210.7 lb

## 2016-10-21 DIAGNOSIS — Z170 Estrogen receptor positive status [ER+]: Secondary | ICD-10-CM | POA: Diagnosis not present

## 2016-10-21 DIAGNOSIS — G620 Drug-induced polyneuropathy: Secondary | ICD-10-CM | POA: Diagnosis not present

## 2016-10-21 DIAGNOSIS — C569 Malignant neoplasm of unspecified ovary: Secondary | ICD-10-CM

## 2016-10-21 DIAGNOSIS — D6959 Other secondary thrombocytopenia: Secondary | ICD-10-CM

## 2016-10-21 DIAGNOSIS — Z95828 Presence of other vascular implants and grafts: Secondary | ICD-10-CM

## 2016-10-21 DIAGNOSIS — Z79811 Long term (current) use of aromatase inhibitors: Secondary | ICD-10-CM | POA: Diagnosis not present

## 2016-10-21 DIAGNOSIS — C50811 Malignant neoplasm of overlapping sites of right female breast: Secondary | ICD-10-CM | POA: Diagnosis not present

## 2016-10-21 DIAGNOSIS — K7469 Other cirrhosis of liver: Secondary | ICD-10-CM | POA: Diagnosis not present

## 2016-10-21 DIAGNOSIS — C562 Malignant neoplasm of left ovary: Secondary | ICD-10-CM

## 2016-10-21 DIAGNOSIS — C772 Secondary and unspecified malignant neoplasm of intra-abdominal lymph nodes: Secondary | ICD-10-CM | POA: Diagnosis not present

## 2016-10-21 DIAGNOSIS — Z86718 Personal history of other venous thrombosis and embolism: Secondary | ICD-10-CM

## 2016-10-21 LAB — COMPREHENSIVE METABOLIC PANEL
ALBUMIN: 2.9 g/dL — AB (ref 3.5–5.0)
ALK PHOS: 193 U/L — AB (ref 40–150)
ALT: 37 U/L (ref 0–55)
ANION GAP: 9 meq/L (ref 3–11)
AST: 87 U/L — AB (ref 5–34)
BUN: 5.9 mg/dL — ABNORMAL LOW (ref 7.0–26.0)
CALCIUM: 8.9 mg/dL (ref 8.4–10.4)
CHLORIDE: 107 meq/L (ref 98–109)
CO2: 25 mEq/L (ref 22–29)
Creatinine: 0.6 mg/dL (ref 0.6–1.1)
Glucose: 57 mg/dl — ABNORMAL LOW (ref 70–140)
POTASSIUM: 3.4 meq/L — AB (ref 3.5–5.1)
Sodium: 141 mEq/L (ref 136–145)
Total Bilirubin: 1.78 mg/dL — ABNORMAL HIGH (ref 0.20–1.20)
Total Protein: 6.7 g/dL (ref 6.4–8.3)

## 2016-10-21 LAB — CBC WITH DIFFERENTIAL/PLATELET
BASO%: 0.2 % (ref 0.0–2.0)
BASOS ABS: 0 10*3/uL (ref 0.0–0.1)
EOS ABS: 0.1 10*3/uL (ref 0.0–0.5)
EOS%: 2.6 % (ref 0.0–7.0)
HEMATOCRIT: 39.1 % (ref 34.8–46.6)
HGB: 12.9 g/dL (ref 11.6–15.9)
LYMPH#: 1 10*3/uL (ref 0.9–3.3)
LYMPH%: 19 % (ref 14.0–49.7)
MCH: 32.7 pg (ref 25.1–34.0)
MCHC: 33 g/dL (ref 31.5–36.0)
MCV: 99.2 fL (ref 79.5–101.0)
MONO#: 0.5 10*3/uL (ref 0.1–0.9)
MONO%: 9.2 % (ref 0.0–14.0)
NEUT#: 3.8 10*3/uL (ref 1.5–6.5)
NEUT%: 69 % (ref 38.4–76.8)
PLATELETS: 110 10*3/uL — AB (ref 145–400)
RBC: 3.94 10*6/uL (ref 3.70–5.45)
RDW: 15.8 % — ABNORMAL HIGH (ref 11.2–14.5)
WBC: 5.5 10*3/uL (ref 3.9–10.3)

## 2016-10-21 MED ORDER — HEPARIN SOD (PORK) LOCK FLUSH 100 UNIT/ML IV SOLN
500.0000 [IU] | Freq: Once | INTRAVENOUS | Status: AC | PRN
  Administered 2016-10-21: 500 [IU]
  Filled 2016-10-21: qty 5

## 2016-10-21 MED ORDER — LIDOCAINE-PRILOCAINE 2.5-2.5 % EX CREA
TOPICAL_CREAM | CUTANEOUS | Status: AC
  Filled 2016-10-21: qty 5

## 2016-10-21 MED ORDER — SODIUM CHLORIDE 0.9 % IJ SOLN
10.0000 mL | INTRAMUSCULAR | Status: DC | PRN
  Administered 2016-10-21: 10 mL
  Filled 2016-10-21: qty 10

## 2016-10-21 NOTE — Progress Notes (Signed)
ID: [name]   DOB: 11-06-1955  MR#: [id number]  OBS#:[id number]  PCP: Ann Held, DO GYN:  SU: [facility] OTHER UT:MLYYTK [name], Shanon Ace, Roney Jaffe  CHIEF COMPLAINT:  Estrogen receptor positive Breast Cancer; Ovarian cancer; DVT  CURRENT TREATMENT: Letrozole  INTERVAL HISTORY: [name] returns today for follow-up of her ovarian and breast cancer, accompanied by her husband. She continues on letrozole, with good tolerance. Hot flashes and vaginal dryness are not a major issue. She never developed the arthralgias or myalgias that many patients can experience on this medication. She obtains it at a good price.  We are just restage her with a CT scan of the abdomen and pelvis. This shows no significant change in her retroperitoneal adenopathy as compared to 3 months ago. This is favorable.  However the study also shows significant cirrhosis and raises concerns regarding significant complications from that condition including the possibility of hepatic malignancy and concern regarding esophageal varices.   REVIEW OF SYSTEMS:  [name]  continues to have significant knee problems. Orthopedics has tried a couple of injections and they are trying to get the coccyx, injections approved. [name] has terrible access problems which is the reason she kept her port. She has trouble bruises were they try to obtain an IV line for her recent CT scan. She complains of heartburn, arthritis which is not more intense or persistent than before, some peripheral neuropathy, and significant anxiety and depression. She tells me her sugars are "okay". There has been no bleeding, rash, or fever. She denies nausea or vomiting problems. She says her appetite is fine. He detailed review of systems today was otherwise stable  BREAST CANCER HISTORY: From the original consult note:  [name] noted a mass in her right breast mid December 2013, and as it did not spontaneously resolve  over a couple of weeks she brought it to her primary physician's attention. She was set up for diagnostic mammography and right breast ultrasonography at the breast Center 05/10/2012. (Note that the patient's most recent prior mammography had been in October 2008). The current study showed a spiculated mass in the superior subareolar portion of the right breast measuring approximately 5 cm and associated with pleomorphic calcifications. This was firm and palpable. There was right nipple retraction and skin thickening. Ultrasound confirmed an irregularly marginated hypoechoic mass measuring 3.5 cm by ultrasound, and an abnormal appearing lower right axillary lymph node measuring 2.6 cm.  Biopsies of both the breast mass and the abnormal appearing lymph node were performed 05/21/2012. Both showed an invasive ductal carcinoma, grade 2, with similar prognostic panels (the breast mass was 100% estrogen and 73% progesterone receptor positive, with an MIB-1 of 5%; the lymph node was 100% estrogen 100% progesterone receptor positive, with an MIB-1 of 20%). Both masses were HER-2 negative.  Breast MRI obtained at [facility] 05/29/2012 confirmed a dominant mass in the retroareolar right breast measuring 4.4 cm maximally. There was a satellite nodule inferior and lateral to this mass, measuring 1.7 cm. There were no other masses in either breast. Aside from the previously biopsied lymph node there were other mildly enhancing level I right axillary lymph nodes which did not appear pathologic. There was no other lymphadenopathy noted.   The patient's subsequent history is as detailed below.  OVARIAN CANCER HISTORY: From the 06/16/2014 summary note:  "[name] returns today for review of her restaging studies accompanied by her husband [name]. To summarize her recent history: She was admitted to [facility] with a diagnosis of possible pneumonia in late December 2015. Chest x-ray showed some  increased density in the right upper lobe. CT scan was suggested and was obtained by Dr. Inda Castle. This showed 2 small nodules in the right middle lobe, measuring 8 and 4 mm respectively. Dr. Inda Castle then contacted [name] for further evaluation and we set [name] up for a PET scan and which was performed late January. This showed the 2 nodules in the lung not to be hypermetabolic. This of course does not prove that they are not malignant. More importantly, there was a 13.7 cm cystic/solid mass in the upper pelvis associated with adenopathy,. All of this was hypermetabolic."  The patient was evaluated by gynecologic oncology and on 06/27/2014 she underwent surgery under Dr. Denman George. This consisted of an exploratory laparotomy with hysterectomy and abdominal salpingo-oophorectomy, as well as tumor debulking and omentectomy. 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 The tumor measured 11.5 cm, focally involve the ovarian capsule on the left; the uterus and right ovary and fallopian tube were unremarkable. One para-aortic lymph node was positive out of a total of 6 lymph nodes sampled (2 left common iliac, 2 left para-aortic, and to within the omental resection).  Her subsequent history is as detailed below   PAST MEDICAL HISTORY: Past Medical History:  Diagnosis Date  . Allergy   . Anemia    low iron hx  . Breast cancer ([place]) 2014   a. 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 Cataract    removed ou  . Chronic combined systolic and diastolic CHF (congestive heart failure) ([place])    a. 08/2012 Echo: EF 55-60%, no rwma, mod MR;  b. 07/2014 Echo: EF 45-50%, distal antsept HK, mod TR/MR, mildly bil-atrial enlargement.  . Clear Cell Ovarian Cancer    a. 06/2014 s/p TAH/BSO debulking/lymph node dissection;  b. Now on  chemo.  . Depression   . Diabetes mellitus   . DJD (degenerative joint disease) of lumbar spine   . DVT (deep venous thrombosis) (HCC)    hx of on HRT left leg ~2006  . GERD (gastroesophageal reflux disease)   . Hyperlipidemia   . Hypertension   . Hypothyroidism   . Liver disease, chronic, with cirrhosis ([place])    ? autoimmune  . OSA on CPAP    cpap 4.5 setting  . Paroxysmal atrial fibrillation (HCC)    a. CHA2DS2VASc = 4-->coumadin.  [name] Physical deconditioning   . PPD positive, treated    rx inh     PAST SURGICAL HISTORY: Past Surgical History:  Procedure Laterality Date  . ABDOMINAL HYSTERECTOMY N/A 06/27/2014   Procedure: HYSTERECTOMY ABDOMINAL TOTAL;  Surgeon: Everitt Amber, MD;  Location: [facility];  Service: Gynecology;  Laterality: N/A;  . BREAST BIOPSY     left breast  . BREAST EXCISIONAL BIOPSY Left 2002  . CATARACT EXTRACTION    . CHOLECYSTECTOMY    . EYE SURGERY    . FOOT SURGERY    . history of chemotherapy x 2 treatments, radiation tx  2014  . LAPAROTOMY N/A 06/27/2014   Procedure: EXPLORATORY LAPAROTOMY;  Surgeon: Everitt Amber, MD;  Location: [facility];  Service: Gynecology;  Laterality: N/A;  . MASTECTOMY Right 2014  . MASTECTOMY MODIFIED RADICAL  06/08/2012   Procedure: MASTECTOMY MODIFIED RADICAL;  Surgeon: Edward Jolly, MD;  Location: [place];  Service: General;  Laterality: Right;  . pac removed  2015  . PEG TUBE PLACEMENT     peg removed 2015  . PERCUTANEOUS LIVER BIOPSY    . PORTACATH PLACEMENT  06/08/2012   Procedure: INSERTION PORT-A-CATH;  Surgeon: Edward Jolly, MD;  Location: [place];  Service: General;  Laterality: Left;  . SALPINGOOPHORECTOMY Bilateral 06/27/2014   Procedure: BILATERAL SALPINGO OOPHORECTOMY/TUMOR DEBULKING/LYMPHNODE DISSECTION, OMENTECTOMY;  Surgeon: Everitt Amber, MD;  Location: [facility];  Service: Gynecology;  Laterality: Bilateral;  . TUBAL LIGATION      FAMILY HISTORY Family History  Problem Relation Age of Onset  . Diabetes  Mother   . Hypertension Mother   . Arthritis Mother   . Heart disease Mother   . Heart failure Mother   . Other Mother        benign breast mass  . Stroke Father   . Heart disease Father   . Dementia Father   . Diabetes Paternal Grandmother   . Colon cancer Paternal Grandfather        dx. 70s-80s  . Other Daughter        potentially has had negative BRCA testing  . Other Daughter        hysterectomy; potentially has had negative genetic testing   the patient's father died in his 46s with a history of dementia. He had had prior strokes. The patient's mother died in her 45s, with a history of congestive heart failure. Ijanae had no brothers, one sister. There is no history of breast or ovarian cancer in the family.  GYNECOLOGIC HISTORY: Menarche age 51, first live birth age 60, she is GX P2, menopause approximately 15 years ago, on hormone replacement until 2010.  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 Her husband [name] works for [employer]. Daughter Paul Half is a Physiological scientist and lives in [place]. Daughter Santiago Bur and her family (husband and 2 children aged 59 and 3 years) currently live with the patient. Deanza is a member of a [employer].   ADVANCED DIRECTIVES: Not in place  HEALTH MAINTENANCE: (Updated October 2014) Social History  Substance Use Topics  . Smoking status: Former Smoker    Packs/day: 1.00    Years: 1.00  . Smokeless tobacco: Former Systems developer  . Alcohol use No     Colonoscopy: Never  PAP: Does not recall  Bone density: Never  Lipid panel:   Allergies  Allergen Reactions  . Olmesartan Medoxomil Cough  . Adhesive [Tape] Rash  . Tetracycline Hcl Other (See Comments)  . Venlafaxine Other (See Comments)    severe dry mouth    Current Outpatient Prescriptions  Medication Sig Dispense Refill  . acetaminophen (TYLENOL)  500 MG tablet Take 500 mg by mouth every 6 (six) hours as needed for moderate pain.    [name] ALPRAZolam (XANAX) 0.5 MG tablet Take 1-2 tablets (0.5-1 mg total) by mouth 2 (two) times daily as needed for anxiety. 180 tablet 0  . buPROPion (WELLBUTRIN XL) 150 MG 24 hr tablet TAKE 1 TABLET BY MOUTH  DAILY 90 tablet 1  . clonazePAM (KLONOPIN) 0.5 MG tablet Take 1 tablet (0.5 mg total) by mouth 2 (two) times daily. 180 tablet 0  . escitalopram (LEXAPRO) 20 MG tablet TAKE 1 TABLET BY MOUTH  EVERY MORNING 90 tablet 1  . famotidine (PEPCID)  20 MG tablet Take 1 tablet (20 mg total) by mouth daily. 30 tablet 0  . furosemide (LASIX) 20 MG tablet TAKE 1 TABLET BY MOUTH  DAILY 90 tablet 0  . glimepiride (AMARYL) 2 MG tablet TAKE 1 TABLET BY MOUTH TWO  TIMES DAILY 180 tablet 0  . glucose blood (BAYER CONTOUR NEXT TEST) test strip Use as instructed to check pt's blood sugar twice a day.  DX E11.9 100 each 5  . JANUVIA 100 MG tablet TAKE 1 TABLET BY MOUTH DAILY 90 tablet 1  . ketoconazole (NIZORAL) 2 % cream Apply 1 application topically daily. 15 g 3  . letrozole (FEMARA) 2.5 MG tablet Take 1 tablet (2.5 mg total) by mouth daily. 90 tablet 4  . levothyroxine (SYNTHROID, LEVOTHROID) 150 MCG tablet TAKE 1 TABLET BY MOUTH  DAILY 90 tablet 0  . lidocaine-prilocaine (EMLA) cream Apply over port area 1-2 hours before chemotherapy 30 g 0  . metFORMIN (GLUCOPHAGE-XR) 500 MG 24 hr tablet TAKE 2 TABLETS BY MOUTH EVERY EVENING 60 tablet 2  . metoprolol (LOPRESSOR) 50 MG tablet TAKE 1 TABLET TWICE DAILY 60 tablet 0  . metroNIDAZOLE (METROGEL) 1 % gel Apply topically daily. 45 g 0  . NONFORMULARY OR COMPOUNDED ITEM Cmp, lipid, hgba1c, tsh--- dx , diabetes II, htn, hyperlipidemia, hypothyroidism 1 each 0  . OLANZapine (ZYPREXA) 2.5 MG tablet TAKE 1 TABLET NIGHTLY AT  BEDTIME 90 tablet 1  . omeprazole (PRILOSEC) 20 MG capsule TAKE 1 CAPSULE BY MOUTH  DAILY 90 capsule 1  . ONETOUCH DELICA LANCETS FINE MISC Check blood sugar twice  daily. Dx:E11.9 100 each 12  . potassium chloride (K-DUR,KLOR-CON) 10 MEQ tablet TAKE 1 TABLET BY MOUTH TWO  TIMES DAILY 180 tablet 1  . pravastatin (PRAVACHOL) 20 MG tablet TAKE 1 TABLET BY MOUTH AT  BEDTIME 90 tablet 1   No current facility-administered medications for this visit.     OBJECTIVE: Middle-aged white woman Examined in a wheelchair Vitals:   10/21/16 1135  BP: (!) 158/84  Pulse: 71  Resp: 17  Temp: 98.1 F (36.7 C)     Body mass index is 35.06 kg/m.    ECOG FS: 2 Filed Weights   10/21/16 1135  Weight: 210 lb 11.2 oz (95.6 kg)   Sclerae unicteric, pupils round and equal Oropharynx clear and moist; some hearing loss noted No cervical or supraclavicular adenopathy Lungs no rales or rhonchi Heart regular rate and rhythm Abd soft, nontender, positive bowel sounds MSK no focal spinal tenderness Neuro: nonfocal, well oriented, mildly flat affect Breasts: The right breast is status post mastectomy. There is no evidence of chest wall recurrence. The left breast is benign. Both axillae are benign.   Lab Results  Component Value Date   WBC 4.1 09/23/2016   NEUTROABS 2.7 09/23/2016   HGB 13.0 09/23/2016   HCT 39.7 09/23/2016   MCV 97.4 09/23/2016   PLT 116 (L) 09/23/2016     Chemistry      Component Value Date/Time   NA 144 09/23/2016 0835   K 3.4 (L) 09/23/2016 0835   CL 107 03/21/2016 0854   CL 104 10/29/2012 1058   CO2 22 09/23/2016 0835   BUN 5.0 (L) 09/23/2016 0835   CREATININE 0.6 09/23/2016 0835      Component Value Date/Time   CALCIUM 8.5 09/23/2016 0835   ALKPHOS 174 (H) 09/23/2016 0835   AST 95 (H) 09/23/2016 0835   ALT 42 09/23/2016 0835   BILITOT 1.76 (H) 09/23/2016 4742  STUDIES: Dg Chest 2 View  Result Date: 10/17/2016 CLINICAL DATA:  History of right breast cancer. EXAM: CHEST  2 VIEW COMPARISON:  Radiographs of May 19, 2016. FINDINGS: Stable cardiomediastinal silhouette. Mild central pulmonary vascular congestion is noted.  Left-sided Port-A-Cath is unchanged in position. No pneumothorax or pleural effusion is noted. Right axillary surgical clips are noted. Stable interstitial densities are noted in the right lung most consistent scarring. No acute pulmonary disease is noted. Bony thorax is unremarkable. IMPRESSION: Stable right lung scarring. No acute cardiopulmonary abnormality seen. Electronically Signed   By: Marijo Conception, M.D.   On: 10/17/2016 09:17   Ct Abdomen Pelvis W Contrast  Result Date: 10/17/2016 CLINICAL DATA:  History of breast cancer and ovarian cancer. EXAM: CT ABDOMEN AND PELVIS WITH CONTRAST TECHNIQUE: Multidetector CT imaging of the abdomen and pelvis was performed using the standard protocol following bolus administration of intravenous contrast. CONTRAST:  163m ISOVUE-300 IOPAMIDOL (ISOVUE-300) INJECTION 61% COMPARISON:  07/24/2016 FINDINGS: Lower chest: Small left pleural effusion is identified. Hepatobiliary: Morphologic features of liver are identified compatible with cirrhosis. Heterogeneous attenuation throughout both lobes of the liver identified compatible with advanced cirrhosis. Within the right lobe there is an ill defined area of relative lower attenuation measuring approximately 4 cm, image 19 of series 7. Indeterminate. Previous cholecystectomy. No biliary dilatation. Pancreas: Unremarkable. No pancreatic ductal dilatation or surrounding inflammatory changes. Spleen: The spleen measures 14.3 cm in length. No suspicious focal splenic abnormality. Adrenals/Urinary Tract: The adrenal glands appear normal. Nonobstructing right renal calculi identified. Left kidney cysts noted. No kidney mass or hydronephrosis. Urinary bladder appears normal. Stomach/Bowel: The stomach is unremarkable. No pathologic dilatation of the large or small bowel loops. Vascular/Lymphatic: Aortic atherosclerosis. No aneurysm. Esophageal and gastric varicosities are identified Index periaortic lymph node measures 1.5 cm,  image 39 of series 2. Previously 1.8 cm. Index mesenteric lymph node measures 1.4 cm, image 35 of series 2. Previously 1 cm. Reproductive: Status post hysterectomy. No adnexal masses. Other: Trace ascites identified within the abdomen and pelvis. Musculoskeletal: Degenerative disc disease identified at L5-S1. No aggressive lytic or sclerotic bone lesions. IMPRESSION: 1. No significant change in abdominal adenopathy. 2. Advanced changes of cirrhosis. 3. The enhancement pattern of the liver is diffusely heterogeneous. No specific findings identified to suggest metastatic disease. There is a ill defined area of lower attenuation within the lateral right lobe measuring 4 cm, image 19 of series 7. Indeterminate. Further evaluation with liver protocol MRI is suggested in this patient who may be at increased risk for hepatocellular carcinoma. 4. 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 5.  Aortic Atherosclerosis (ICD10-I70.0). Electronically Signed   By: TKerby MoorsM.D.   On: 10/17/2016 10:45     ASSESSMENT: 61y.o.  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    (2)  treated in the adjuvant setting with docetaxel/ cyclophosphamide given every 3 weeks. There were multiple and severe complications, and the  patient tolerated only two cycles, last dose 07/29/2012  (3) letrozole started 08/16/2012  (a) osteopenia, with a T score of -1.14 April 2013  (4) postmastectomy radiation completed 12/24/2012  (5)  comorbidities include diabetes, hypertension, chronic liver disease with cirrhosis and fatty liver, and nutritional disturbance with temporarily dependence on PEG feeds (discontinued July 2014).  (  6) restaging studies January 2016 show  (a) 8 mm and  4 mm Right middle lobe lung nodules, not metabolically active on PET -- will require follow-up  (b) hypermetabolic 88.8 cm mixed solid/cystic lesion in upper pelvis, with associated adenopathy  OVARIAN CANCER (7) status post exploratory laparotomy 06/27/2014 with total abdominal hysterectomy, bilateral salpingo-oophorectomy, para-aortic lymphadenectomy, omentectomy and radical tumor debulking for a left-sided clear cell ovarian cancer, pT1c pN1, stage IIIC .  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 despite being on Coumadin for PAF (subtherapeutic INR) with mobile Right common femoral clot  (a) status post thrombolysis 09/30/2014  (b) on Lovenox daily as of 10/02/2014, stopped April 2017  (c) IVC filter placed 10/26/2014 as mobile clot still noted Right groin  (d) IVC filter removed 01/30/2015 with documented resolution of the movable thrombus 01/16/2015  (e) normal quantitative D-dimer 08/06/2015 and 09/24/2015  (10) started abraxane 10/13/2014, repeated every 14 days for 6 cycles (12 doses), completed 03/26/2015  (11) genetics testing December 04, 2014 through the Breast/Ovarian Cancer Panel [facility] no deleterious mutations in ATM, BARD1, BRCA1, BRCA2, BRIP1, CDH1, CHEK2, FANCC, MLH1, MSH2, MSH6, NBN, PALB2, PMS2, PTEN, RAD51C, RAD51D, STK11, TP53, and XRCC2.    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 Is starting with breast cancer, she is now 4-1/2 years out from definitive surgery with no evidence of disease recurrence. This is very favorable.  She continues on letrozole, with good tolerance. The plan is to continue that for a total of 7 years.  She is a little over a year out from definitive surgery for her ovarian cancer. Is no definitive evidence of disease recurrence, with the most recent scan showing stable retroperitoneal adenopathy. However her CA 125 had risen as of 4 weeks ago. This is being repeated today.  Her cirrhosis is worrisome. I'm obtaining an alpha-fetoprotein today. I think she needs to be evaluated by GI for possible complications including bleeding from esophageal varices. Also she has never had a colonoscopy. I'm placing that referral today.  Assuming no surprises, she will see me again in 3 months. We will repeat lab work before that visit. If there is a further rise in the CA-125 we will consider a PET scan.  She knows to call for any problems that may develop before then.  Chauncey Cruel, MD    10/21/2016

## 2016-10-22 LAB — CA 125: CANCER ANTIGEN (CA) 125: 87.2 U/mL — AB (ref 0.0–38.1)

## 2016-10-23 LAB — HM DIABETES EYE EXAM

## 2016-10-29 ENCOUNTER — Encounter: Payer: Self-pay | Admitting: *Deleted

## 2016-11-18 ENCOUNTER — Ambulatory Visit (HOSPITAL_BASED_OUTPATIENT_CLINIC_OR_DEPARTMENT_OTHER): Payer: Medicare Other

## 2016-11-18 ENCOUNTER — Other Ambulatory Visit (HOSPITAL_BASED_OUTPATIENT_CLINIC_OR_DEPARTMENT_OTHER): Payer: Medicare Other

## 2016-11-18 DIAGNOSIS — C569 Malignant neoplasm of unspecified ovary: Secondary | ICD-10-CM

## 2016-11-18 DIAGNOSIS — C50811 Malignant neoplasm of overlapping sites of right female breast: Secondary | ICD-10-CM

## 2016-11-18 DIAGNOSIS — C562 Malignant neoplasm of left ovary: Secondary | ICD-10-CM

## 2016-11-18 DIAGNOSIS — K7460 Unspecified cirrhosis of liver: Secondary | ICD-10-CM

## 2016-11-18 DIAGNOSIS — Z95828 Presence of other vascular implants and grafts: Secondary | ICD-10-CM

## 2016-11-18 DIAGNOSIS — K769 Liver disease, unspecified: Principal | ICD-10-CM

## 2016-11-18 LAB — CBC WITH DIFFERENTIAL/PLATELET
BASO%: 0.2 % (ref 0.0–2.0)
Basophils Absolute: 0 10*3/uL (ref 0.0–0.1)
EOS ABS: 0.1 10*3/uL (ref 0.0–0.5)
EOS%: 1.9 % (ref 0.0–7.0)
HCT: 36.4 % (ref 34.8–46.6)
HGB: 12 g/dL (ref 11.6–15.9)
LYMPH%: 21 % (ref 14.0–49.7)
MCH: 33.1 pg (ref 25.1–34.0)
MCHC: 33 g/dL (ref 31.5–36.0)
MCV: 100.3 fL (ref 79.5–101.0)
MONO#: 0.4 10*3/uL (ref 0.1–0.9)
MONO%: 9.3 % (ref 0.0–14.0)
NEUT#: 3.2 10*3/uL (ref 1.5–6.5)
NEUT%: 67.6 % (ref 38.4–76.8)
Platelets: 94 10*3/uL — ABNORMAL LOW (ref 145–400)
RBC: 3.63 10*6/uL — AB (ref 3.70–5.45)
RDW: 16.8 % — ABNORMAL HIGH (ref 11.2–14.5)
WBC: 4.7 10*3/uL (ref 3.9–10.3)
lymph#: 1 10*3/uL (ref 0.9–3.3)

## 2016-11-18 LAB — COMPREHENSIVE METABOLIC PANEL
ALT: 36 U/L (ref 0–55)
ANION GAP: 6 meq/L (ref 3–11)
AST: 71 U/L — ABNORMAL HIGH (ref 5–34)
Albumin: 2.6 g/dL — ABNORMAL LOW (ref 3.5–5.0)
Alkaline Phosphatase: 162 U/L — ABNORMAL HIGH (ref 40–150)
BILIRUBIN TOTAL: 2.08 mg/dL — AB (ref 0.20–1.20)
BUN: 4.8 mg/dL — ABNORMAL LOW (ref 7.0–26.0)
CHLORIDE: 108 meq/L (ref 98–109)
CO2: 25 meq/L (ref 22–29)
Calcium: 8.6 mg/dL (ref 8.4–10.4)
Creatinine: 0.6 mg/dL (ref 0.6–1.1)
Glucose: 98 mg/dl (ref 70–140)
Potassium: 3.7 mEq/L (ref 3.5–5.1)
Sodium: 139 mEq/L (ref 136–145)
TOTAL PROTEIN: 6.2 g/dL — AB (ref 6.4–8.3)

## 2016-11-18 MED ORDER — HEPARIN SOD (PORK) LOCK FLUSH 100 UNIT/ML IV SOLN
500.0000 [IU] | Freq: Once | INTRAVENOUS | Status: AC | PRN
  Administered 2016-11-18: 500 [IU]
  Filled 2016-11-18: qty 5

## 2016-11-18 MED ORDER — LIDOCAINE-PRILOCAINE 2.5-2.5 % EX CREA
TOPICAL_CREAM | CUTANEOUS | Status: AC
  Filled 2016-11-18: qty 5

## 2016-11-18 MED ORDER — SODIUM CHLORIDE 0.9 % IJ SOLN
10.0000 mL | INTRAMUSCULAR | Status: DC | PRN
  Administered 2016-11-18: 10 mL
  Filled 2016-11-18: qty 10

## 2016-11-18 NOTE — Patient Instructions (Signed)

## 2016-11-19 LAB — CA 125: Cancer Antigen (CA) 125: 111 U/mL — ABNORMAL HIGH (ref 0.0–38.1)

## 2016-11-19 LAB — AFP TUMOR MARKER: AFP, SERUM, TUMOR MARKER: 5.2 ng/mL (ref 0.0–8.3)

## 2016-11-20 ENCOUNTER — Other Ambulatory Visit: Payer: Self-pay | Admitting: Oncology

## 2016-11-22 ENCOUNTER — Other Ambulatory Visit: Payer: Self-pay | Admitting: Oncology

## 2016-11-22 ENCOUNTER — Other Ambulatory Visit: Payer: Self-pay | Admitting: Family Medicine

## 2016-11-22 DIAGNOSIS — E1165 Type 2 diabetes mellitus with hyperglycemia: Principal | ICD-10-CM

## 2016-11-22 DIAGNOSIS — E785 Hyperlipidemia, unspecified: Secondary | ICD-10-CM

## 2016-11-22 DIAGNOSIS — IMO0002 Reserved for concepts with insufficient information to code with codable children: Secondary | ICD-10-CM

## 2016-11-22 DIAGNOSIS — E1151 Type 2 diabetes mellitus with diabetic peripheral angiopathy without gangrene: Secondary | ICD-10-CM

## 2016-11-22 DIAGNOSIS — I10 Essential (primary) hypertension: Secondary | ICD-10-CM

## 2016-12-01 ENCOUNTER — Other Ambulatory Visit: Payer: Medicare Other

## 2016-12-01 ENCOUNTER — Other Ambulatory Visit (HOSPITAL_COMMUNITY)
Admission: AD | Admit: 2016-12-01 | Discharge: 2016-12-01 | Disposition: A | Discharge status: Home / Self Care | Payer: Medicare Other | Source: Ambulatory Visit | Attending: Gastroenterology | Admitting: Gastroenterology

## 2016-12-01 ENCOUNTER — Ambulatory Visit (INDEPENDENT_AMBULATORY_CARE_PROVIDER_SITE_OTHER): Payer: Medicare Other | Admitting: Gastroenterology

## 2016-12-01 ENCOUNTER — Encounter: Payer: Self-pay | Admitting: Gastroenterology

## 2016-12-01 VITALS — BP 120/68 | HR 72 | Ht 64.0 in | Wt 208.2 lb

## 2016-12-01 DIAGNOSIS — K7460 Unspecified cirrhosis of liver: Secondary | ICD-10-CM

## 2016-12-01 DIAGNOSIS — Z8543 Personal history of malignant neoplasm of ovary: Secondary | ICD-10-CM | POA: Diagnosis not present

## 2016-12-01 DIAGNOSIS — R932 Abnormal findings on diagnostic imaging of liver and biliary tract: Secondary | ICD-10-CM

## 2016-12-01 LAB — PROTIME-INR
INR: 1.4
Prothrombin Time: 17.3 seconds — ABNORMAL HIGH (ref 11.4–15.2)

## 2016-12-01 NOTE — Patient Instructions (Signed)
If you are age 63 or older, your body mass index should be between 23-30. Your Body mass index is 35.75 kg/m. If this is out of the aforementioned range listed, please consider follow up with your Primary Care Provider.  If you are age 76 or younger, your body mass index should be between 19-25. Your Body mass index is 35.75 kg/m. If this is out of the aformentioned range listed, please consider follow up with your Primary Care Provider.   You have been scheduled for an MRI at [facility] on Monday, July 30th. Your appointment time is 3:00pm. Please arrive 15 minutes prior to your appointment time for registration purposes. Please make certain not to have anything to eat or drink 4 hours prior to your test. In addition, if you have any metal in your body, have a pacemaker or defibrillator, please be sure to let your ordering physician know. This test typically takes 45 minutes to 1 hour to complete. Should you need to reschedule, please call [phone] to do so.  Your physician has requested that you go to the basement for lab work before leaving today.  Dr. Havery Moros will contact your PCP in regards to Lopressor. He then will be in touch with you.  Thank you.

## 2016-12-01 NOTE — Progress Notes (Signed)
HPI :  61 y/o female with history of breast cancer / ovarian cancer currently on chemotherapy, CHF and AF, history of DVT, suspected cirrhosis, seen her in consultation per Dr. Jana Hakim for a new patient visit with me for cirrhosis. She has not been seen in our office since 2012.  Chemotherapy history includes docetaxel / cyclophosphamide, carboplatin, paclitaxel, abraxane - she was on chemo in 2014 and 2016. She reports her cancer is in remission as she is aware although she has retroperitoneal lymphadenopathy noted on CT and a rising CA 125 level  She reports she has had a history of fatty liver, but no knowledge of cirrhosis until recently. Uncle has cirrhosis, no other family history. No history of jaundice. She denies any LE edema or abdominal distension. She has a history of IVC filter and then removal for history of clots. No blood thinner currently.  Husband reports memory is not good at times. No history of encephalopathy. On review of chart/ lab, she was seen remotely by Dr. Olevia Perches for fatty liver and she underwent a liver biopsy in 2010 which showed cirrhosis - description shows steatosis and I suspect NASH, although ANA was markedly positive back then but histology not classic for autoimmune hepatitis.  No prior colonoscopy in the past. No blood in the stools. Grandfather had colon cancer. No problems with her bowels that bother her. No prior EGD.   No tobacco use. No alcohol use remotely. She takes tylenol PRNfor pain, no NSAIDs.  CT scan 10/17/16 - advanced changes of cirrhosis with ill defined area of lower attenuation within the lateral right lobe measuring 4 cm. 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   Past Medical History:  Diagnosis Date  . Allergy   . Anemia    low iron hx  . Arthritis   . Breast cancer ([place]) 2014   a. 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 Cataract    removed ou  . Chronic combined systolic and diastolic CHF (congestive heart failure) ([place])    a. 08/2012 Echo: EF 55-60%, no rwma, mod MR;  b. 07/2014 Echo: EF 45-50%, distal antsept HK, mod TR/MR, mildly bil-atrial enlargement.  . Clear Cell Ovarian Cancer    a. 06/2014 s/p TAH/BSO debulking/lymph node dissection;  b. Now on chemo.  . Depression   . Diabetes mellitus   . DJD (degenerative joint disease) of lumbar spine   . DVT (deep venous thrombosis) ([facility])    hx of on HRT left leg ~2006  . Gallstones   . GERD (gastroesophageal reflux disease)   . Hyperlipidemia   . Hypertension   . Hypothyroidism   . Liver disease, chronic, with cirrhosis ([place])    ? autoimmune  . OSA on CPAP    cpap 4.5 setting  . Paroxysmal atrial fibrillation ([facility])    a. CHA2DS2VASc = 4-->coumadin.  [name] Physical deconditioning   . PPD positive, treated    rx inh   . Sleep apnea      Past Surgical History:  Procedure Laterality Date  . ABDOMINAL HYSTERECTOMY N/A 06/27/2014   Procedure: HYSTERECTOMY ABDOMINAL TOTAL;  Surgeon: Everitt Amber, MD;  Location: [facility];  Service: Gynecology;  Laterality: N/A;  . BREAST BIOPSY     left breast  . BREAST EXCISIONAL BIOPSY Left 2002  . CATARACT EXTRACTION    . CHOLECYSTECTOMY    . EYE  SURGERY    . FOOT SURGERY    . history of chemotherapy x 2 treatments, radiation tx  2014  . LAPAROTOMY N/A 06/27/2014   Procedure: EXPLORATORY LAPAROTOMY;  Surgeon: Everitt Amber, MD;  Location: [facility];  Service: Gynecology;  Laterality: N/A;  . MASTECTOMY Right 2014  . MASTECTOMY MODIFIED RADICAL  06/08/2012   Procedure: MASTECTOMY MODIFIED RADICAL;  Surgeon: Edward Jolly, MD;  Location: [facility];  Service: General;  Laterality: Right;  . pac removed  2015  . PEG TUBE PLACEMENT     peg removed 2015  . PERCUTANEOUS LIVER BIOPSY    . PORTACATH PLACEMENT  06/08/2012   Procedure: INSERTION PORT-A-CATH;  Surgeon: Edward Jolly, MD;   Location: [facility];  Service: General;  Laterality: Left;  . SALPINGOOPHORECTOMY Bilateral 06/27/2014   Procedure: BILATERAL SALPINGO OOPHORECTOMY/TUMOR DEBULKING/LYMPHNODE DISSECTION, OMENTECTOMY;  Surgeon: Everitt Amber, MD;  Location: [facility];  Service: Gynecology;  Laterality: Bilateral;  . TUBAL LIGATION     Family History  Problem Relation Age of Onset  . Diabetes Mother   . Hypertension Mother   . Arthritis Mother   . Heart disease Mother   . Heart failure Mother   . Other Mother        benign breast mass  . Stroke Father   . Heart disease Father   . Dementia Father   . Diabetes Paternal Grandmother   . Colon cancer Paternal Grandfather        dx. 70s-80s  . Colon cancer Maternal Grandfather   . Other Daughter        potentially has had negative BRCA testing  . Other Daughter        hysterectomy; potentially has had negative genetic testing  . Rectal cancer Neg Hx   . Esophageal cancer Neg Hx   . Liver cancer Neg Hx    Social History  Substance Use Topics  . Smoking status: Former Smoker    Packs/day: 1.00    Years: 1.00  . Smokeless tobacco: Former Systems developer  . Alcohol use No   Current Outpatient Prescriptions  Medication Sig Dispense Refill  . acetaminophen (TYLENOL) 500 MG tablet Take 500 mg by mouth every 6 (six) hours as needed for moderate pain.    [name] ALPRAZolam (XANAX) 0.5 MG tablet Take 1-2 tablets (0.5-1 mg total) by mouth 2 (two) times daily as needed for anxiety. 180 tablet 0  . buPROPion (WELLBUTRIN XL) 150 MG 24 hr tablet TAKE 1 TABLET BY MOUTH  DAILY 90 tablet 1  . clonazePAM (KLONOPIN) 0.5 MG tablet Take 1 tablet (0.5 mg total) by mouth 2 (two) times daily. 180 tablet 0  . escitalopram (LEXAPRO) 20 MG tablet TAKE 1 TABLET BY MOUTH  EVERY MORNING 90 tablet 1  . famotidine (PEPCID) 20 MG tablet Take 1 tablet (20 mg total) by mouth daily. 30 tablet 0  . furosemide (LASIX) 20 MG tablet TAKE 1 TABLET BY MOUTH  DAILY 90 tablet 0  . glimepiride (AMARYL) 2 MG tablet TAKE  1 TABLET BY MOUTH TWO  TIMES DAILY 180 tablet 0  . glucose blood (BAYER CONTOUR NEXT TEST) test strip Use as instructed to check pt's blood sugar twice a day.  DX E11.9 100 each 5  . ketoconazole (NIZORAL) 2 % cream Apply 1 application topically daily. 15 g 3  . letrozole (FEMARA) 2.5 MG tablet Take 1 tablet (2.5 mg total) by mouth daily. 90 tablet 4  . levothyroxine (SYNTHROID, LEVOTHROID) 150 MCG tablet TAKE 1  TABLET BY MOUTH  DAILY 90 tablet 0  . lidocaine-prilocaine (EMLA) cream Apply over port area 1-2 hours before chemotherapy 30 g 0  . metFORMIN (GLUCOPHAGE-XR) 500 MG 24 hr tablet TAKE 2 TABLETS BY MOUTH  EVERY EVENING (Patient taking differently: one in the am and one in the pm) 180 tablet 1  . metoprolol (LOPRESSOR) 50 MG tablet TAKE 1 TABLET TWICE DAILY 60 tablet 0  . metroNIDAZOLE (METROGEL) 1 % gel Apply topically daily. 45 g 0  . NONFORMULARY OR COMPOUNDED ITEM Cmp, lipid, hgba1c, tsh--- dx , diabetes II, htn, hyperlipidemia, hypothyroidism 1 each 0  . OLANZapine (ZYPREXA) 2.5 MG tablet TAKE 1 TABLET NIGHTLY AT  BEDTIME 90 tablet 1  . omeprazole (PRILOSEC) 20 MG capsule TAKE 1 CAPSULE BY MOUTH  DAILY 90 capsule 1  . ONETOUCH DELICA LANCETS FINE MISC Check blood sugar twice daily. Dx:E11.9 100 each 12  . potassium chloride (K-DUR,KLOR-CON) 10 MEQ tablet TAKE 1 TABLET BY MOUTH TWO  TIMES DAILY 180 tablet 1  . pravastatin (PRAVACHOL) 20 MG tablet TAKE 1 TABLET BY MOUTH AT  BEDTIME 90 tablet 1   No current facility-administered medications for this visit.    Allergies  Allergen Reactions  . Olmesartan Medoxomil Cough  . Adhesive [Tape] Rash  . Tetracycline Hcl Other (See Comments)  . Venlafaxine Other (See Comments)    severe dry mouth     Review of Systems: All systems reviewed and negative except where noted in HPI.   Lab Results  Component Value Date   WBC 4.7 11/18/2016   HGB 12.0 11/18/2016   HCT 36.4 11/18/2016   MCV 100.3 11/18/2016   PLT 94 (L) 11/18/2016     Lab Results  Component Value Date   CREATININE 0.6 11/18/2016   BUN 4.8 (L) 11/18/2016   NA 139 11/18/2016   K 3.7 11/18/2016   CL 107 03/21/2016   CO2 25 11/18/2016    Lab Results  Component Value Date   ALT 36 11/18/2016   AST 71 (H) 11/18/2016   ALKPHOS 162 (H) 11/18/2016   BILITOT 2.08 (H) 11/18/2016   Lab Results  Component Value Date   INR 1.40 12/01/2016   INR 1.29 02/21/2015   INR 1.20 (L) 11/03/2014   PROTIME 14.4 (H) 11/03/2014   PROTIME 14.4 (H) 10/13/2014   PROTIME 14.4 (H) 10/06/2014     Physical Exam: BP 120/68   Pulse 72   Ht _0  (1.626 m)   Wt 208 lb 4 oz (94.5 kg)   BMI 35.75 kg/m  Constitutional: Pleasant,well-developed, female in no acute distress. HEENT: Normocephalic and atraumatic. Conjunctivae are normal. No scleral icterus. Neck supple.  Cardiovascular: Normal rate, regular rhythm.  Pulmonary/chest: Effort normal and breath sounds normal. No wheezing, rales or rhonchi. Abdominal: Soft, protuberant, nontender. There are no masses palpable. No hepatomegaly. Extremities: no edema Lymphadenopathy: No cervical adenopathy noted. Neurological: Alert and oriented to person place and time. No asterixis Skin: Skin is warm and dry. No rashes noted. Psychiatric: Normal mood and affect. Behavior is normal.   ASSESSMENT AND PLAN: 61 year old female with a history of both breast and ovarian cancer, thus postchemotherapy as above, with rising CEA is 125 levels and retroperitoneal lymphadenopathy noted on CT, here to establish care for cirrhosis of liver.   Chart reviewed. The patient underwent a liver biopsy per Dr. Olevia Perches in 2010 which confirmed cirrhosis since that time, unclear etiology, suspect NASH although her ANA was markedly positive at the time. She was never told  she had autoimmune hepatitis. She's had chronic elevation in liver enzymes. Recent CT scan shows cirrhosis, suspicious for varices, and there is a 4 cm indeterminate lesion in her  liver which warrants further evaluation with MRI to ensure no evidence of malignancy.  Overall she seems compensated at this point.   She has otherwise never had screening colonoscopy. We discussed that today, she stated she will never have a colonoscopy and is not interested in screening exams.  Recommend the following at this time: - MRI of the liver with contrast, rule out [facility] / metastatic disease - labs for chronic liver disease - prior iron studies normal, will assess for viral hep and autoimmune hep, recheck INR - discussed EGD to screen for varices, it is likely she has them based on imaging. She declined EGD when discussed with her. will reach out to her primary care to see if metoprolol can be switched to Coreg or Nadolol / propranolol to empirically treat for varices - I discussed colon cancer screening with her today. She declined, wishes to await liver MRI first  [place] Cellar, MD [facility] Pager [phone]  CC: Magrinat, Virgie Dad, MD

## 2016-12-02 LAB — HEPATITIS C ANTIBODY: HCV Ab: 0.1 s/co ratio (ref 0.0–0.9)

## 2016-12-02 LAB — HEPATITIS B SURFACE ANTIGEN: HEP B S AG: NEGATIVE

## 2016-12-02 LAB — HEPATITIS A ANTIBODY, TOTAL: Hep A Total Ab: POSITIVE — AB

## 2016-12-02 LAB — IGG: IgG (Immunoglobin G), Serum: 1471 mg/dL (ref 700–1600)

## 2016-12-02 LAB — HEPATITIS B SURFACE ANTIBODY,QUALITATIVE: Hep B S Ab: NONREACTIVE

## 2016-12-02 LAB — ANTI-SMOOTH MUSCLE ANTIBODY, IGG: F-ACTIN AB IGG: 17 U (ref 0–19)

## 2016-12-03 ENCOUNTER — Telehealth: Payer: Self-pay | Admitting: Gastroenterology

## 2016-12-03 ENCOUNTER — Other Ambulatory Visit: Payer: Self-pay

## 2016-12-03 MED ORDER — NADOLOL 40 MG PO TABS: 40.0000 mg | ORAL_TABLET | Freq: Every day | ORAL | 3 refills | Status: DC

## 2016-12-03 NOTE — Telephone Encounter (Signed)
I communicated with Dr. Etter Sjogren about this patient. She is okay with stopping metoprolol and switching to another beta blocker for empiric treatment of varices (noted on imaging, patient declines EGD).   Caryl Pina can you stop her metoprolol and prescribe her nadolol 40mg  po q day. #90 RF3  Can you let her know? Thanks

## 2016-12-03 NOTE — Telephone Encounter (Signed)
Dr. Havery Moros, Is there an alternative?

## 2016-12-03 NOTE — Telephone Encounter (Signed)
Patient husband states that medication nadolol is too expensive and would like a call to discuss other options pt can take.

## 2016-12-03 NOTE — Telephone Encounter (Signed)
Pts husband informed a new Nadolol script and to d/c Metoprolol.  He understood. I took Metoprolol off of pts med list.

## 2016-12-04 ENCOUNTER — Other Ambulatory Visit: Payer: Self-pay

## 2016-12-04 MED ORDER — PROPRANOLOL HCL 20 MG PO TABS: 20.0000 mg | ORAL_TABLET | Freq: Two times a day (BID) | ORAL | 3 refills | Status: DC

## 2016-12-04 NOTE — Telephone Encounter (Signed)
Patient husband calling back about his, stating he would like a call to discuss before it is sent in but also that he would like medication [facility]

## 2016-12-04 NOTE — Telephone Encounter (Signed)
Yes, we can try propranolol 20mg  twice daily, hopefully this is cheaper for her. thanks

## 2016-12-04 NOTE — Telephone Encounter (Signed)
Propranolol sent to [facility]. Pts husband informed. He will call back if med is too expensive

## 2016-12-05 ENCOUNTER — Other Ambulatory Visit: Payer: Self-pay

## 2016-12-05 ENCOUNTER — Telehealth: Payer: Self-pay | Admitting: Oncology

## 2016-12-05 ENCOUNTER — Telehealth: Payer: Self-pay | Admitting: Family Medicine

## 2016-12-05 NOTE — Telephone Encounter (Signed)
Patient's husband called and wanted to move appointments up a few weeks so patient is scheduled for 8/13 2:15pm labs, flush and Dr Jana Hakim

## 2016-12-05 NOTE — Telephone Encounter (Signed)
Caller name:Tracey Case Manager/[employer] Relationship to patient: Can be reached:[phone] ext [place]:  Reason for call:Requesting BP Readings

## 2016-12-08 ENCOUNTER — Ambulatory Visit (HOSPITAL_COMMUNITY)
Admission: RE | Admit: 2016-12-08 | Discharge: 2016-12-08 | Disposition: A | Discharge status: Home / Self Care | Payer: Medicare Other | Source: Ambulatory Visit | Attending: Gastroenterology | Admitting: Gastroenterology

## 2016-12-08 ENCOUNTER — Other Ambulatory Visit: Payer: Self-pay | Admitting: Family Medicine

## 2016-12-08 DIAGNOSIS — R932 Abnormal findings on diagnostic imaging of liver and biliary tract: Secondary | ICD-10-CM

## 2016-12-08 DIAGNOSIS — I10 Essential (primary) hypertension: Secondary | ICD-10-CM

## 2016-12-08 DIAGNOSIS — R161 Splenomegaly, not elsewhere classified: Secondary | ICD-10-CM | POA: Insufficient documentation

## 2016-12-08 MED ORDER — GADOXETATE DISODIUM 0.25 MMOL/ML IV SOLN
10.0000 mL | Freq: Once | INTRAVENOUS | Status: AC | PRN
  Administered 2016-12-08: 9 mL via INTRAVENOUS

## 2016-12-08 NOTE — Telephone Encounter (Signed)
Tried calling no answer/no vm. 

## 2016-12-09 ENCOUNTER — Encounter: Payer: Self-pay | Admitting: *Deleted

## 2016-12-09 ENCOUNTER — Telehealth: Payer: Self-pay | Admitting: *Deleted

## 2016-12-09 NOTE — Progress Notes (Signed)
[place] Work  Hol[employer] representative received referral from Therapist, sports for concerns at home.  CSW contacted patient's daughter to offer support and assess for needs.  Patient's daughter expressed concern for her mother, and identified some delays and changes in cognition.  Patients daughter stated she felt her mother was safe, but was concerned for the recent behavior changes and confusion she has witnessed.  CSW shared concerns with patients healthcare team.    Johnnye Lana, MSW, LCSW, OSW-C Clinical Social Worker [place] [phone]

## 2016-12-09 NOTE — Telephone Encounter (Signed)
This RN spoke with pt's daughter per her concerns with memory issues and concern for possible alzheimer or dementia.  [name] also discussed other issues including her own concerns regarding current status of her mother with need for resource for pt's dtr to our social work department who can assist and offer assistance.  [name] verbalized understanding.  Social work will be contacted per above.

## 2016-12-10 ENCOUNTER — Ambulatory Visit (INDEPENDENT_AMBULATORY_CARE_PROVIDER_SITE_OTHER): Payer: Medicare Other | Admitting: Gastroenterology

## 2016-12-10 ENCOUNTER — Other Ambulatory Visit: Payer: Self-pay

## 2016-12-10 DIAGNOSIS — K7460 Unspecified cirrhosis of liver: Secondary | ICD-10-CM

## 2016-12-10 DIAGNOSIS — Z23 Encounter for immunization: Secondary | ICD-10-CM | POA: Diagnosis not present

## 2016-12-10 NOTE — Telephone Encounter (Signed)
Tried calling no answer/no vm. 

## 2016-12-11 NOTE — Telephone Encounter (Signed)
Called no answer no voicemail

## 2016-12-15 ENCOUNTER — Telehealth: Payer: Self-pay | Admitting: Family Medicine

## 2016-12-15 NOTE — Telephone Encounter (Signed)
OK to send in rx for requested meter but am suspicious that insurance will cover.

## 2016-12-15 NOTE — Telephone Encounter (Signed)
Received [place]  Approval for Freestyle Freedom Glucose meter --- approved under her Part B benefit plan through  05/11/2017 Letter sent to scan.

## 2016-12-15 NOTE — Telephone Encounter (Signed)
I have put in an order for this glucometer, but not sure if you would recommend this.  Let me know and can send in for her.

## 2016-12-15 NOTE — Telephone Encounter (Signed)
Caller name: Mr Popescu Relation to pt: spouse  Call back number:[phone]   Reason for call:  Spouse checked with patient insurance to ensure coverage for freestyle glucometer without pricking thumb, please send Rx to [facility] [phone]

## 2016-12-16 MED ORDER — FREESTYLE LIBRE READER DEVI: 1.0000 | Freq: Every day | 0 refills | Status: DC

## 2016-12-16 NOTE — Telephone Encounter (Signed)
Sent in as requested 

## 2016-12-16 NOTE — Addendum Note (Signed)
Addended by: Sharon Seller B on: 12/16/2016 02:57 PM   Modules accepted: Orders

## 2016-12-18 ENCOUNTER — Other Ambulatory Visit: Payer: Self-pay | Admitting: Oncology

## 2016-12-18 ENCOUNTER — Other Ambulatory Visit: Payer: Self-pay | Admitting: Family Medicine

## 2016-12-18 DIAGNOSIS — K219 Gastro-esophageal reflux disease without esophagitis: Secondary | ICD-10-CM

## 2016-12-19 ENCOUNTER — Telehealth: Payer: Self-pay

## 2016-12-22 ENCOUNTER — Ambulatory Visit (HOSPITAL_BASED_OUTPATIENT_CLINIC_OR_DEPARTMENT_OTHER): Payer: Medicare Other

## 2016-12-22 ENCOUNTER — Ambulatory Visit (HOSPITAL_BASED_OUTPATIENT_CLINIC_OR_DEPARTMENT_OTHER): Payer: Medicare Other | Admitting: Oncology

## 2016-12-22 ENCOUNTER — Other Ambulatory Visit (HOSPITAL_BASED_OUTPATIENT_CLINIC_OR_DEPARTMENT_OTHER): Payer: Medicare Other

## 2016-12-22 VITALS — BP 126/52 | HR 71 | Temp 97.5°F | Ht 64.0 in | Wt 209.1 lb

## 2016-12-22 DIAGNOSIS — C50811 Malignant neoplasm of overlapping sites of right female breast: Secondary | ICD-10-CM

## 2016-12-22 DIAGNOSIS — D6959 Other secondary thrombocytopenia: Secondary | ICD-10-CM | POA: Diagnosis not present

## 2016-12-22 DIAGNOSIS — Z79811 Long term (current) use of aromatase inhibitors: Secondary | ICD-10-CM

## 2016-12-22 DIAGNOSIS — Z170 Estrogen receptor positive status [ER+]: Secondary | ICD-10-CM

## 2016-12-22 DIAGNOSIS — Z86718 Personal history of other venous thrombosis and embolism: Secondary | ICD-10-CM | POA: Diagnosis not present

## 2016-12-22 DIAGNOSIS — C772 Secondary and unspecified malignant neoplasm of intra-abdominal lymph nodes: Secondary | ICD-10-CM | POA: Diagnosis not present

## 2016-12-22 DIAGNOSIS — Z95828 Presence of other vascular implants and grafts: Secondary | ICD-10-CM

## 2016-12-22 DIAGNOSIS — G620 Drug-induced polyneuropathy: Secondary | ICD-10-CM

## 2016-12-22 DIAGNOSIS — C562 Malignant neoplasm of left ovary: Secondary | ICD-10-CM | POA: Diagnosis not present

## 2016-12-22 DIAGNOSIS — C569 Malignant neoplasm of unspecified ovary: Secondary | ICD-10-CM

## 2016-12-22 LAB — COMPREHENSIVE METABOLIC PANEL
ALBUMIN: 2.7 g/dL — AB (ref 3.5–5.0)
ALK PHOS: 158 U/L — AB (ref 40–150)
ALT: 34 U/L (ref 0–55)
AST: 83 U/L — ABNORMAL HIGH (ref 5–34)
Anion Gap: 9 mEq/L (ref 3–11)
BUN: 5.5 mg/dL — AB (ref 7.0–26.0)
CALCIUM: 8.8 mg/dL (ref 8.4–10.4)
CO2: 24 mEq/L (ref 22–29)
Chloride: 107 mEq/L (ref 98–109)
Creatinine: 0.6 mg/dL (ref 0.6–1.1)
Glucose: 39 mg/dl — CL (ref 70–140)
POTASSIUM: 3.5 meq/L (ref 3.5–5.1)
Sodium: 141 mEq/L (ref 136–145)
TOTAL PROTEIN: 6.5 g/dL (ref 6.4–8.3)
Total Bilirubin: 1.95 mg/dL — ABNORMAL HIGH (ref 0.20–1.20)

## 2016-12-22 LAB — CBC WITH DIFFERENTIAL/PLATELET
BASO%: 0.6 % (ref 0.0–2.0)
Basophils Absolute: 0 10*3/uL (ref 0.0–0.1)
EOS%: 2 % (ref 0.0–7.0)
Eosinophils Absolute: 0.1 10*3/uL (ref 0.0–0.5)
HCT: 37.7 % (ref 34.8–46.6)
HGB: 12.5 g/dL (ref 11.6–15.9)
LYMPH#: 0.9 10*3/uL (ref 0.9–3.3)
LYMPH%: 18.7 % (ref 14.0–49.7)
MCH: 33.5 pg (ref 25.1–34.0)
MCHC: 33.2 g/dL (ref 31.5–36.0)
MCV: 100.7 fL (ref 79.5–101.0)
MONO#: 0.4 10*3/uL (ref 0.1–0.9)
MONO%: 7.6 % (ref 0.0–14.0)
NEUT%: 71.1 % (ref 38.4–76.8)
NEUTROS ABS: 3.5 10*3/uL (ref 1.5–6.5)
PLATELETS: 123 10*3/uL — AB (ref 145–400)
RBC: 3.74 10*6/uL (ref 3.70–5.45)
RDW: 15.3 % — ABNORMAL HIGH (ref 11.2–14.5)
WBC: 4.9 10*3/uL (ref 3.9–10.3)

## 2016-12-22 MED ORDER — HEPARIN SOD (PORK) LOCK FLUSH 100 UNIT/ML IV SOLN
500.0000 [IU] | Freq: Once | INTRAVENOUS | Status: AC | PRN
  Administered 2016-12-22: 500 [IU]
  Filled 2016-12-22: qty 5

## 2016-12-22 MED ORDER — SODIUM CHLORIDE 0.9 % IJ SOLN
10.0000 mL | INTRAMUSCULAR | Status: DC | PRN
  Administered 2016-12-22: 10 mL
  Filled 2016-12-22: qty 10

## 2016-12-22 NOTE — Progress Notes (Signed)
ID: [name]   DOB: [birth year]  MR#: [id number]  LEX#:[id number]  PCP: Rhonda Held, DO GYN:  SU: [facility] OTHER SW:[facility] [name], [name], [name]  CHIEF COMPLAINT:  Estrogen receptor positive Breast Cancer; Ovarian cancer; DVT  CURRENT TREATMENT: Letrozole  INTERVAL HISTORY: [name] returns today for follow-up of her ovarian and breast cancers, accompanied by her husband. [name] continues on letrozole, with good tolerance.Hot flashes and vaginal dryness are not a major issue. She never developed the arthralgias or myalgias that many patients can experience on this medication. She obtains it at a good price.  At the last visit we noted worsening issues regarding cirrhosis and she was evaluated for this by [name]. 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 He obtained a liver MRI which showed no enhancing lesions suggestive of hepatocellular carcinoma. He obtained last for chronic liver disease including hepatitis B and C studies, which were negative, and he set her up for hepatitis B vaccination.  Quite aside from these issues we had a call from the patient's daughter, concerned that her mother may be showing some signs of dementia or other reason for confusion and complaining that the patient's husband seems overwhelmed by these problems, response with anger, and "just can't deal with this".   REVIEW OF SYSTEMS:  [name] is receiving "shots" in her knees and she is hoping this will help. She is not exercising much at all as a result. She was seen in a wheelchair today. She denies unusual headaches, visual changes, nausea, vomiting, cough, phlegm production, or pleurisy, or any change in bowel or bladder habits. A detailed review of systems today was otherwise stable.  BREAST CANCER HISTORY: From the original consult note:  [name] noted a mass in her right breast mid December  2013, and as it did not spontaneously resolve over a couple of weeks she brought it to her primary physician's attention. She was set up for diagnostic mammography and right breast ultrasonography at the breast Center 05/10/2012. (Note that the patient's most recent prior mammography had been in October 2008). The current study showed a spiculated mass in the superior subareolar portion of the right breast measuring approximately 5 cm and associated with pleomorphic calcifications. This was firm and palpable. There was right nipple retraction and skin thickening. Ultrasound confirmed an irregularly marginated hypoechoic mass measuring 3.5 cm by ultrasound, and an abnormal appearing lower right axillary lymph node measuring 2.6 cm.  Biopsies of both the breast mass and the abnormal appearing lymph node were performed 05/21/2012. Both showed an invasive ductal carcinoma, grade 2, with similar prognostic panels (the breast mass was 100% estrogen and 73% progesterone receptor positive, with an MIB-1 of 5%; the lymph node was 100% estrogen 100% progesterone receptor positive, with an MIB-1 of 20%). Both masses were HER-2 negative.  Breast MRI obtained at [facility] imaging 05/29/2012 confirmed a dominant mass in the retroareolar right breast measuring 4.4 cm maximally. There was a satellite nodule inferior and lateral to this mass, measuring 1.7 cm. There were no other masses in either breast. Aside from the previously biopsied lymph node there were other mildly enhancing level I right axillary lymph nodes which did not appear pathologic. There was no other lymphadenopathy noted.   The patient's subsequent history is as detailed below.  OVARIAN CANCER HISTORY: From the 06/16/2014 summary note:  "[name] returns today for review of her restaging studies accompanied by her  husband [name]. To summarize her recent history: She was admitted to [facility] with a diagnosis of possible pneumonia in late  December 2015. Chest x-ray showed some increased density in the right upper lobe. CT scan was suggested and was obtained by [name]. This showed 2 small nodules in the right middle lobe, measuring 8 and 4 mm respectively. [name] then contacted [name] for further evaluation and we set [name] up for a PET scan and which was performed late January. This showed the 2 nodules in the lung not to be hypermetabolic. This of course does not prove that they are not malignant. More importantly, there was a 13.7 cm cystic/solid mass in the upper pelvis associated with adenopathy,. All of this was hypermetabolic."  The patient was evaluated by gynecologic oncology and on 06/27/2014 she underwent surgery under [name]. This consisted of an exploratory laparotomy with hysterectomy and abdominal salpingo-oophorectomy, as well as tumor debulking and omentectomy. 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 The tumor measured 11.5 cm, focally involve the ovarian capsule on the left; the uterus and right ovary and fallopian tube were unremarkable. One para-aortic lymph node was positive out of a total of 6 lymph nodes sampled (2 left common iliac, 2 left para-aortic, and to within the omental resection).  Her subsequent history is as detailed below   PAST MEDICAL HISTORY: Past Medical History:  Diagnosis Date  . Allergy   . Anemia    low iron hx  . Arthritis   . Breast cancer ([place]) 2014   a. 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 Cataract    removed ou  . Chronic combined systolic and diastolic CHF (congestive heart failure) ([place])    a. 08/2012 Echo: EF 55-60%, no rwma, mod MR;  b. 07/2014 Echo: EF 45-50%, distal antsept HK, mod TR/MR, mildly bil-atrial enlargement.  . Clear Cell Ovarian Cancer    a. 06/2014 s/p  TAH/BSO debulking/lymph node dissection;  b. Now on chemo.  . Depression   . Diabetes mellitus   . DJD (degenerative joint disease) of lumbar spine   . DVT (deep venous thrombosis) ([facility])    hx of on HRT left leg ~2006  . Gallstones   . GERD (gastroesophageal reflux disease)   . Hyperlipidemia   . Hypertension   . Hypothyroidism   . Liver disease, chronic, with cirrhosis ([place])    ? autoimmune  . OSA on CPAP    cpap 4.5 setting  . Paroxysmal atrial fibrillation ([facility])    a. CHA2DS2VASc = 4-->coumadin.  [name] Physical deconditioning   . PPD positive, treated    rx inh   . Sleep apnea     PAST SURGICAL HISTORY: Past Surgical History:  Procedure Laterality Date  . ABDOMINAL HYSTERECTOMY N/A 06/27/2014   Procedure: HYSTERECTOMY ABDOMINAL TOTAL;  Surgeon: Everitt Amber, MD;  Location: [facility];  Service: Gynecology;  Laterality: N/A;  . BREAST BIOPSY     left breast  . BREAST EXCISIONAL BIOPSY Left 2002  . CATARACT EXTRACTION    . CHOLECYSTECTOMY    . EYE SURGERY    . FOOT SURGERY    . history of chemotherapy x 2 treatments, radiation tx  2014  . LAPAROTOMY N/A 06/27/2014   Procedure: EXPLORATORY LAPAROTOMY;  Surgeon: Everitt Amber, MD;  Location: [facility];  Service: Gynecology;  Laterality: N/A;  . MASTECTOMY Right 2014  . MASTECTOMY MODIFIED RADICAL  06/08/2012   Procedure: MASTECTOMY MODIFIED RADICAL;  Surgeon: Edward Jolly, MD;  Location: [place];  Service: General;  Laterality: Right;  . pac removed  2015  . PEG TUBE PLACEMENT     peg removed 2015  . PERCUTANEOUS LIVER BIOPSY    . PORTACATH PLACEMENT  06/08/2012   Procedure: INSERTION PORT-A-CATH;  Surgeon: Edward Jolly, MD;  Location: [place];  Service: General;  Laterality: Left;  . SALPINGOOPHORECTOMY Bilateral 06/27/2014   Procedure: BILATERAL SALPINGO OOPHORECTOMY/TUMOR DEBULKING/LYMPHNODE DISSECTION, OMENTECTOMY;  Surgeon: Everitt Amber, MD;  Location: [facility];  Service: Gynecology;  Laterality: Bilateral;  . TUBAL LIGATION       FAMILY HISTORY Family History  Problem Relation Age of Onset  . Diabetes Mother   . Hypertension Mother   . Arthritis Mother   . Heart disease Mother   . Heart failure Mother   . Other Mother        benign breast mass  . Stroke Father   . Heart disease Father   . Dementia Father   . Diabetes Paternal Grandmother   . Colon cancer Paternal Grandfather        dx. 70s-80s  . Colon cancer Maternal Grandfather   . Other Daughter        potentially has had negative BRCA testing  . Other Daughter        hysterectomy; potentially has had negative genetic testing  . Rectal cancer Neg Hx   . Esophageal cancer Neg Hx   . Liver cancer Neg Hx    the patient's father died in his 10s with a history of dementia. He had had prior strokes. The patient's mother died in her 85s, with a history of congestive heart failure. [name] had no brothers, one sister. There is no history of breast or ovarian cancer in the family.  GYNECOLOGIC HISTORY: Menarche age 7, first live birth age 71, she is GX P2, menopause approximately 15 years ago, on hormone replacement until 2010.  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 Her husband [name] works for [employer]. Daughter [name] is a Physiological scientist and lives in [place]. Daughter [name] and her family (husband and 2 children aged 52 and 3 years) currently live with the patient. [name] is a member of a [employer].   ADVANCED DIRECTIVES: Not in place  HEALTH MAINTENANCE: (Updated October 2014) Social History  Substance Use Topics  . Smoking status: Former Smoker    Packs/day: 1.00    Years: 1.00  . Smokeless tobacco: Former Systems developer  . Alcohol use No     Colonoscopy: Never  PAP: Does not recall  Bone density: Never  Lipid panel:   Allergies  Allergen Reactions  . Olmesartan Medoxomil Cough  . Adhesive [Tape] Rash   . Tetracycline Hcl Other (See Comments)  . Venlafaxine Other (See Comments)    severe dry mouth    Current Outpatient Prescriptions  Medication Sig Dispense Refill  . acetaminophen (TYLENOL) 500 MG tablet Take 500 mg by mouth every 6 (six) hours as needed for moderate pain.    [name] ALPRAZolam (XANAX) 0.5 MG tablet Take 1-2 tablets (0.5-1 mg total) by mouth 2 (two) times daily as needed for anxiety. 180 tablet 0  . buPROPion (WELLBUTRIN XL) 150 MG 24 hr tablet TAKE 1  TABLET BY MOUTH  DAILY 90 tablet 1  . Continuous Blood Gluc Receiver (FREESTYLE LIBRE READER) DEVI Inject 1 Stick as directed daily. Use daily to check blood sugar. E11.9 1 Device 0  . escitalopram (LEXAPRO) 20 MG tablet TAKE 1 TABLET BY MOUTH  EVERY MORNING 90 tablet 1  . famotidine (PEPCID) 20 MG tablet Take 1 tablet (20 mg total) by mouth daily. 30 tablet 0  . furosemide (LASIX) 20 MG tablet TAKE 1 TABLET BY MOUTH  DAILY 90 tablet 0  . glucose blood (BAYER CONTOUR NEXT TEST) test strip Use as instructed to check pt's blood sugar twice a day.  DX E11.9 100 each 5  . ketoconazole (NIZORAL) 2 % cream Apply 1 application topically daily. 15 g 3  . letrozole (FEMARA) 2.5 MG tablet Take 1 tablet (2.5 mg total) by mouth daily. 90 tablet 4  . levothyroxine (SYNTHROID, LEVOTHROID) 150 MCG tablet TAKE 1 TABLET BY MOUTH  DAILY 90 tablet 0  . lidocaine-prilocaine (EMLA) cream Apply over port area 1-2 hours before chemotherapy 30 g 0  . metFORMIN (GLUCOPHAGE-XR) 500 MG 24 hr tablet TAKE 2 TABLETS BY MOUTH  EVERY EVENING (Patient taking differently: one in the am and one in the pm) 180 tablet 1  . metroNIDAZOLE (METROGEL) 1 % gel Apply topically daily. 45 g 0  . nadolol (CORGARD) 40 MG tablet Take 1 tablet (40 mg total) by mouth daily. 90 tablet 3  . NONFORMULARY OR COMPOUNDED ITEM Cmp, lipid, hgba1c, tsh--- dx , diabetes II, htn, hyperlipidemia, hypothyroidism 1 each 0  . OLANZapine (ZYPREXA) 2.5 MG tablet TAKE 1 TABLET NIGHTLY AT  BEDTIME 90  tablet 0  . omeprazole (PRILOSEC) 20 MG capsule TAKE 1 CAPSULE BY MOUTH  DAILY 90 capsule 0  . ONETOUCH DELICA LANCETS FINE MISC Check blood sugar twice daily. Dx:E11.9 100 each 12  . potassium chloride (K-DUR,KLOR-CON) 10 MEQ tablet TAKE 1 TABLET BY MOUTH TWO  TIMES DAILY 180 tablet 0  . pravastatin (PRAVACHOL) 20 MG tablet TAKE 1 TABLET BY MOUTH AT  BEDTIME 90 tablet 1  . propranolol (INDERAL) 20 MG tablet Take 1 tablet (20 mg total) by mouth 2 (two) times daily. 180 tablet 3   No current facility-administered medications for this visit.     OBJECTIVE: Middle-aged white woman examined in a wheelchair  Vitals:   12/22/16 1221  BP: (!) 126/52  Pulse: 71  Temp: (!) 97.5 F (36.4 C)  SpO2: 96%     Body mass index is 35.89 kg/m.    ECOG FS: 2 Filed Weights   12/22/16 1221  Weight: 209 lb 1.6 oz (94.8 kg)   Sclerae unicteric, EOMs intact Oropharynx clear and moist No cervical or supraclavicular adenopathy Lungs no rales or rhonchi Heart regular rate and rhythm Abd soft, nontender, positive bowel sounds, no masses palpated MSK no focal spinal tenderness, no upper extremity lymphedema Neuro: nonfocal, well oriented, appropriate affect She has undergone right mastectomy, with no evidence of chest wall recurrence. The left breast is benign. Both axillae are benign.    Lab Results  Component Value Date   WBC 4.9 12/22/2016   NEUTROABS 3.5 12/22/2016   HGB 12.5 12/22/2016   HCT 37.7 12/22/2016   MCV 100.7 12/22/2016   PLT 123 (L) 12/22/2016     Chemistry      Component Value Date/Time   NA 141 12/22/2016 1144   K 3.5 12/22/2016 1144   CL 107 03/21/2016 0854   CL 104 10/29/2012 1058  CO2 24 12/22/2016 1144   BUN 5.5 (L) 12/22/2016 1144   CREATININE 0.6 12/22/2016 1144      Component Value Date/Time   CALCIUM 8.8 12/22/2016 1144   ALKPHOS 158 (H) 12/22/2016 1144   AST 83 (H) 12/22/2016 1144   ALT 34 12/22/2016 1144   BILITOT 1.95 (H) 12/22/2016 1144       STUDIES: Mr Abdomen W Wo Contrast  Result Date: 12/08/2016 CLINICAL DATA:  Evaluate for metastatic carcinoma. Mr. cirrhosis. History of breast cancer or ovarian cancer. EXAM: MRI ABDOMEN WITHOUT AND WITH CONTRAST TECHNIQUE: Multiplanar multisequence MR imaging of the abdomen was performed both before and after the administration of intravenous contrast. CONTRAST:  46m EOVIST GADOXETATE DISODIUM 0.25 MOL/L IV SOLN COMPARISON:  CT 10/17/2016 FINDINGS: Lower chest:  Lung bases are clear. Hepatobiliary: Liver has a lobular contour with enlargement of the caudate lobe. There is linear high T2 signal within liver parenchyma consists with of fibrosis. No discrete lesion in the RIGHT hepatic lobe to correspond to the CT abnormality. On the post-contrast enhanced imaging there is no discrete enhancing lesion. Region of hypoperfusion in the RIGHT hepatic lobe (image 31, series 502) corresponds the CT abnormality. This region accumulates radiotracer on delayed imaging consistent with hepatocytes origin. Pancreas: Normal pancreatic parenchymal intensity. No ductal dilatation or inflammation. Spleen: Spleen is enlarged.  Splenic vein is patent Adrenals/urinary tract: Adrenal glands normal. Nonenhancing LEFT renal cyst Stomach/Bowel: Stomach and limited of the small bowel is unremarkable Vascular/Lymphatic: Abdominal aortic normal caliber. No retroperitoneal periportal lymphadenopathy. Musculoskeletal: No aggressive osseous lesion IMPRESSION: 1. No enhancing lesion to suggest hepatocellular carcinoma. No evidence of liver metastasis. 2. Region of hypoperfusion in the RIGHT hepatic lobe likely represents a regenerating nodule or dysplastic nodule. 3. Morphologic changes in liver consistent with cirrhosis. 4. Splenomegaly consistent with portal hypertension. Electronically Signed   By: SSuzy BouchardM.D.   On: 12/08/2016 17:06     ASSESSMENT: 61y.o.  BRCA negative [name]   BREAST CANCER (1)  status  post right mastectomy  06/08/2012 for a pT3, pN1a, stage IIIA invasive ductal carcinoma of overlapping sites, grade 2,  estrogen and progesterone receptor positive, HER-2/neu negative, with an MIB-1 of 20%.    (2)  treated in the adjuvant setting with docetaxel/ cyclophosphamide given every 3 weeks. There were multiple and severe complications, and the  patient tolerated only two cycles, last dose 07/29/2012  (3) letrozole started 08/16/2012  (a) osteopenia, with a T score of -1.14 April 2013  (4) postmastectomy radiation completed 12/24/2012  (5)  comorbidities include diabetes, hypertension, chronic liver disease with cirrhosis and fatty liver, and nutritional disturbance with temporarily dependence on PEG feeds (discontinued July 2014).  (6) restaging studies January 2016 show  (a) 8 mm and 4 mm Right middle lobe lung nodules, not metabolically active on PET -- will require follow-up  (b) hypermetabolic 173.4cm mixed solid/cystic lesion in upper pelvis, with associated adenopathy  OVARIAN CANCER (7) status post exploratory laparotomy 06/27/2014 with total abdominal hysterectomy, bilateral salpingo-oophorectomy, para-aortic lymphadenectomy, omentectomy and radical tumor debulking for a left-sided clear cell ovarian cancer, pT1c pN1, stage IIIC .  (8) adjuvant chemotherapy consisting of carboplatin and paclitaxel given weekly days 1 and 8 of each 21 day cycle, for 6-8 cycles as tolerated, started 07/17/2014, with onpro support  (a) day 8 cycle 2 omitted and further treatments cancelled because of an episode of Klebsiella sepsis requiring intensive care unit admission-- last chemotherapy dose 03/26/2015  (9) bilateral lower extremity DVTs  documented 09/25/2014 despite being on Coumadin for PAF (subtherapeutic INR) with mobile Right common femoral clot  (a) status post thrombolysis 09/30/2014  (b) on Lovenox daily as of 10/02/2014, stopped April 2017  (c) IVC filter placed 10/26/2014 as  mobile clot still noted Right groin  (d) IVC filter removed 01/30/2015 with documented resolution of the movable thrombus 01/16/2015  (e) normal quantitative D-dimer 08/06/2015 and 09/24/2015  (10) started abraxane 10/13/2014, repeated every 14 days for 6 cycles (12 doses), completed 03/26/2015  (11) genetics testing December 04, 2014 through the Breast[facility]Ovarian Cancer Panel [facility] no deleterious mutations in ATM, BARD1, BRCA1, BRCA2, BRIP1, CDH1, CHEK2, FANCC, MLH1, MSH2, MSH6, NBN, PALB2, PMS2, PTEN, RAD51C, RAD51D, STK11, TP53, and XRCC2.    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 She is on 2 benzodiazepines as well as on Zyprexa. I think we can certainly stop the Klonopin and see if that improves things. It may be that she could go off the Zyprexa as well. She would have to discuss that with Dr. Vassar Bing primary care physician.  The blood sugar of 39 is of concern. It certainly can also cause mental status changes. She does not seem to be aware that her blood sugar is as low as that. I am stopping her glimepiride and asking her to check her blood sugar before breakfast and before supper one day and before lunch and before bedtime the next day for the next several days and then call us with those results. She is continuing on metformin as before  She is going to see me again 03/10/2017. Hopefully we will see some improvement in the confusion symptoms by then. At that time I will set her up for restaging studies in December.  She knows to call for any other issues that may develop before her next visit.  Chauncey Cruel, MD    12/22/2016

## 2016-12-23 ENCOUNTER — Telehealth: Payer: Self-pay

## 2016-12-23 ENCOUNTER — Other Ambulatory Visit: Payer: Self-pay | Admitting: *Deleted

## 2016-12-23 ENCOUNTER — Telehealth: Payer: Self-pay | Admitting: Family Medicine

## 2016-12-23 ENCOUNTER — Other Ambulatory Visit: Payer: Self-pay | Admitting: Family Medicine

## 2016-12-23 ENCOUNTER — Ambulatory Visit: Payer: Medicare Other | Admitting: Family Medicine

## 2016-12-23 LAB — CA 125: Cancer Antigen (CA) 125: 109.9 U/mL — ABNORMAL HIGH (ref 0.0–38.1)

## 2016-12-23 MED ORDER — ALPRAZOLAM 0.5 MG PO TABS: 0.5000 mg | ORAL_TABLET | Freq: Two times a day (BID) | ORAL | 0 refills | Status: DC | PRN

## 2016-12-23 NOTE — Telephone Encounter (Signed)
Chart opened by accident. Duplicate. LB

## 2016-12-23 NOTE — Telephone Encounter (Signed)
Christine notified that patient is a diabetic

## 2016-12-23 NOTE — Telephone Encounter (Signed)
Received medication issue communication/order form. Issue code A potential drug interation-documentation available upon request, Tramadol and escitalopram. I checked pt's med list and Tramadol is not on medication list but pt is taking escitalopram. Made provider aware. LB

## 2016-12-23 NOTE — Telephone Encounter (Signed)
Altha Harm PT with [facility] [phone]  Called in to request a call back she said that she is working with pt and need notes to verify if diabetes is in her Hx.

## 2016-12-24 ENCOUNTER — Other Ambulatory Visit: Payer: Self-pay

## 2016-12-24 MED ORDER — ALPRAZOLAM 0.5 MG PO TABS: 0.5000 mg | ORAL_TABLET | Freq: Two times a day (BID) | ORAL | 0 refills | Status: DC | PRN

## 2016-12-25 ENCOUNTER — Encounter: Payer: Self-pay | Admitting: *Deleted

## 2017-01-05 ENCOUNTER — Encounter: Payer: Medicare Other | Admitting: Gastroenterology

## 2017-01-09 ENCOUNTER — Ambulatory Visit (INDEPENDENT_AMBULATORY_CARE_PROVIDER_SITE_OTHER): Payer: Medicare Other | Admitting: Gastroenterology

## 2017-01-09 DIAGNOSIS — Z23 Encounter for immunization: Secondary | ICD-10-CM

## 2017-01-13 ENCOUNTER — Other Ambulatory Visit: Payer: Medicare Other

## 2017-01-13 ENCOUNTER — Ambulatory Visit: Payer: Medicare Other | Admitting: Oncology

## 2017-02-03 ENCOUNTER — Other Ambulatory Visit: Payer: Self-pay | Admitting: *Deleted

## 2017-02-03 ENCOUNTER — Telehealth: Payer: Self-pay | Admitting: Family Medicine

## 2017-02-03 MED ORDER — GLUCOSE BLOOD VI STRP: ORAL_STRIP | 1 refills | Status: DC

## 2017-02-03 NOTE — Telephone Encounter (Signed)
Sent Rx to [facility] since Patient only has a few strips left

## 2017-02-03 NOTE — Telephone Encounter (Signed)
Relation to VP:XTGG Call back number:[phone] Pharmacy:  Reason for call:  Spouse requesting ONETOUCH VERIO test strip Rx please send to today due to patient having a few strips left,please advise  [place], [place] [phone] (Phone) [phone] (Fax)

## 2017-02-05 ENCOUNTER — Other Ambulatory Visit: Payer: Self-pay | Admitting: Oncology

## 2017-02-05 ENCOUNTER — Other Ambulatory Visit: Payer: Self-pay

## 2017-02-06 ENCOUNTER — Other Ambulatory Visit (INDEPENDENT_AMBULATORY_CARE_PROVIDER_SITE_OTHER): Payer: Medicare Other

## 2017-02-06 DIAGNOSIS — R932 Abnormal findings on diagnostic imaging of liver and biliary tract: Secondary | ICD-10-CM

## 2017-02-06 DIAGNOSIS — K7460 Unspecified cirrhosis of liver: Secondary | ICD-10-CM | POA: Diagnosis not present

## 2017-02-06 LAB — HEPATIC FUNCTION PANEL
ALT: 23 U/L (ref 0–35)
AST: 56 U/L — AB (ref 0–37)
Albumin: 3.2 g/dL — ABNORMAL LOW (ref 3.5–5.2)
Alkaline Phosphatase: 150 U/L — ABNORMAL HIGH (ref 39–117)
BILIRUBIN TOTAL: 2 mg/dL — AB (ref 0.2–1.2)
Bilirubin, Direct: 0.7 mg/dL — ABNORMAL HIGH (ref 0.0–0.3)
TOTAL PROTEIN: 6.7 g/dL (ref 6.0–8.3)

## 2017-02-06 LAB — PROTIME-INR
INR: 1.6 ratio — AB (ref 0.8–1.0)
Prothrombin Time: 17.2 s — ABNORMAL HIGH (ref 9.6–13.1)

## 2017-02-10 ENCOUNTER — Ambulatory Visit (INDEPENDENT_AMBULATORY_CARE_PROVIDER_SITE_OTHER): Payer: Medicare Other | Admitting: Medical

## 2017-02-10 ENCOUNTER — Encounter: Payer: Self-pay | Admitting: Medical

## 2017-02-10 VITALS — BP 126/66 | HR 71 | Temp 98.0°F | Resp 16 | Ht 64.0 in | Wt 199.4 lb

## 2017-02-10 DIAGNOSIS — L089 Local infection of the skin and subcutaneous tissue, unspecified: Secondary | ICD-10-CM | POA: Diagnosis not present

## 2017-02-10 DIAGNOSIS — L0390 Cellulitis, unspecified: Secondary | ICD-10-CM

## 2017-02-10 LAB — ANTI-NUCLEAR AB-TITER (ANA TITER): ANA Titer 1: 1:320 {titer} — ABNORMAL HIGH

## 2017-02-10 LAB — HEPATITIS C ANTIBODY
Hepatitis C Ab: NONREACTIVE
SIGNAL TO CUT-OFF: 0.18 (ref <1.00)

## 2017-02-10 LAB — CBC WITH DIFFERENTIAL/PLATELET
BASOS PCT: 0.2 % (ref 0.0–3.0)
Basophils Absolute: 0 10*3/uL (ref 0.0–0.1)
EOS ABS: 0.1 10*3/uL (ref 0.0–0.7)
Eosinophils Relative: 1.4 % (ref 0.0–5.0)
HEMATOCRIT: 40.7 % (ref 36.0–46.0)
HEMOGLOBIN: 13.6 g/dL (ref 12.0–15.0)
LYMPHS PCT: 14.8 % (ref 12.0–46.0)
Lymphs Abs: 1.1 10*3/uL (ref 0.7–4.0)
MCHC: 33.5 g/dL (ref 30.0–36.0)
MCV: 99.1 fl (ref 78.0–100.0)
MONOS PCT: 6.7 % (ref 3.0–12.0)
Monocytes Absolute: 0.5 10*3/uL (ref 0.1–1.0)
NEUTROS ABS: 5.7 10*3/uL (ref 1.4–7.7)
Neutrophils Relative %: 76.9 % (ref 43.0–77.0)
Platelets: 92 10*3/uL — ABNORMAL LOW (ref 150.0–400.0)
RBC: 4.1 Mil/uL (ref 3.87–5.11)
RDW: 15.3 % (ref 11.5–15.5)
WBC: 7.4 10*3/uL (ref 4.0–10.5)

## 2017-02-10 LAB — IGG: IgG (Immunoglobin G), Serum: 1543 mg/dL (ref 694–1618)

## 2017-02-10 LAB — HEPATITIS A ANTIBODY, TOTAL: HEPATITIS A AB,TOTAL: NONREACTIVE

## 2017-02-10 LAB — ANTI-SMOOTH MUSCLE ANTIBODY, IGG

## 2017-02-10 LAB — ANA: ANA: POSITIVE — AB

## 2017-02-10 LAB — HEPATITIS B SURFACE ANTIGEN: HEP B S AG: NONREACTIVE

## 2017-02-10 LAB — HEPATITIS B SURFACE ANTIBODY,QUALITATIVE: HEP B S AB: NONREACTIVE

## 2017-02-10 MED ORDER — SULFAMETHOXAZOLE-TRIMETHOPRIM 800-160 MG PO TABS: 1.0000 | ORAL_TABLET | Freq: Two times a day (BID) | ORAL | 0 refills | Status: DC

## 2017-02-10 MED ORDER — CEFTRIAXONE SODIUM 1 G IJ SOLR
1.0000 g | Freq: Once | INTRAMUSCULAR | Status: AC
  Administered 2017-02-10: 1 g via INTRAMUSCULAR

## 2017-02-10 NOTE — Progress Notes (Signed)
Subjective:    Patient ID: [name], female    DOB: [birth year], 61 y.o.   MRN: [id number]  HPI   Pt has diffuse scattered rash on rt flank that came up on Monday morning. Diffuse wide spread redness and warmth to area. Area is senstitive to touch.   Sunday had some achiness and tired sensation on Sunday. Low grade fever on Sunday.  Pt mentions in April got new shingles vaccine. Pt has completed the series.   Husband wanted to know if we would give injection.  Review of Systems  Constitutional: Negative for chills, fatigue and fever.  Musculoskeletal: Negative for back pain.  Skin: Positive for rash.  Neurological: Negative for dizziness, seizures, speech difficulty, weakness, numbness and headaches.  Hematological: Negative for adenopathy. Does not bruise/bleed easily.  Psychiatric/Behavioral: Negative for behavioral problems and confusion. The patient is not nervous/anxious.    Past Medical History:  Diagnosis Date  . Allergy   . Anemia    low iron hx  . Arthritis   . Breast cancer ([place]) 2014   a. 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 Cataract    removed ou  . Chronic combined systolic and diastolic CHF (congestive heart failure) ([place])    a. 08/2012 Echo: EF 55-60%, no rwma, mod MR;  b. 07/2014 Echo: EF 45-50%, distal antsept HK, mod TR/MR, mildly bil-atrial enlargement.  . Clear Cell Ovarian Cancer    a. 06/2014 s/p TAH/BSO debulking/lymph node dissection;  b. Now on chemo.  . Depression   . Diabetes mellitus   . DJD (degenerative joint disease) of lumbar spine   . DVT (deep venous thrombosis) (H[facility])    hx of on HRT left leg ~2006  . Gallstones   . GERD (gastroesophageal reflux disease)   . Hyperlipidemia   . Hypertension   . Hypothyroidism   . Liver disease, chronic, with cirrhosis ([place])    ? autoimmune  . OSA on CPAP    cpap 4.5 setting  .  Paroxysmal atrial fibrillation (H[facility])    a. CHA2DS2VASc = 4-->coumadin.  [name] Physical deconditioning   . PPD positive, treated    rx inh   . Sleep apnea      Social History   Social History  . Marital status: Married    Spouse name: N/A  . Number of children: 2  . Years of education: N/A   Occupational History  . disability [place]   Social History Main Topics  . Smoking status: Former Smoker    Packs/day: 1.00    Years: 1.00  . Smokeless tobacco: Former Systems developer  . Alcohol use No  . Drug use: No     Comment: quit age 64 only smoked as a teen 1 year  . Sexual activity: No   Other Topics Concern  . Not on file   Social History Narrative   Married   Works at [employer] secretary   Daily caffeine use - 2 cups a day plus a couple of sodas a day   Pt doesn't exercise regularly   G2P2   H H of 5 soon to be 6 .     Past Surgical History:  Procedure Laterality Date  . ABDOMINAL HYSTERECTOMY N/A 06/27/2014   Procedure: HYSTERECTOMY ABDOMINAL TOTAL;  Surgeon: Everitt Amber, MD;  [facility];  Service: Gynecology;  Laterality: N/A;  . BREAST BIOPSY  left breast  . BREAST EXCISIONAL BIOPSY Left 2002  . CATARACT EXTRACTION    . CHOLECYSTECTOMY    . EYE SURGERY    . FOOT SURGERY    . history of chemotherapy x 2 treatments, radiation tx  2014  . LAPAROTOMY N/A 06/27/2014   Procedure: EXPLORATORY LAPAROTOMY;  Surgeon: Everitt Amber, MD;  [facility];  Service: Gynecology;  Laterality: N/A;  . MASTECTOMY Right 2014  . MASTECTOMY MODIFIED RADICAL  06/08/2012   Procedure: MASTECTOMY MODIFIED RADICAL;  Surgeon:  Jolly, MD;  Location: [place];  Service: General;  Laterality: Right;  . pac removed  2015  . PEG TUBE PLACEMENT     peg removed 2015  . PERCUTANEOUS LIVER BIOPSY    . PORTACATH PLACEMENT  06/08/2012   Procedure: INSERTION PORT-A-CATH;  Surgeon:  Jolly, MD;  Location: [facility];  Service: General;  Laterality: Left;  . SALPINGOOPHORECTOMY  Bilateral 06/27/2014   Procedure: BILATERAL SALPINGO OOPHORECTOMY/TUMOR DEBULKING/LYMPHNODE DISSECTION, OMENTECTOMY;  Surgeon: Everitt Amber, MD;  [facility];  Service: Gynecology;  Laterality: Bilateral;  . TUBAL LIGATION      Family History  Problem Relation Age of Onset  . Diabetes Mother   . Hypertension Mother   . Arthritis Mother   . Heart disease Mother   . Heart failure Mother   . Other Mother        benign breast mass  . Stroke Father   . Heart disease Father   . Dementia Father   . Diabetes Paternal Grandmother   . Colon cancer Paternal Grandfather        dx. 70s-80s  . Colon cancer Maternal Grandfather   . Other Daughter        potentially has had negative BRCA testing  . Other Daughter        hysterectomy; potentially has had negative genetic testing  . Rectal cancer Neg Hx   . Esophageal cancer Neg Hx   . Liver cancer Neg Hx     Allergies  Allergen Reactions  . Olmesartan Medoxomil Cough  . Adhesive [Tape] Rash  . Tetracycline Hcl Other (See Comments)  . Venlafaxine Other (See Comments)    severe dry mouth    Current Outpatient Prescriptions on File Prior to Visit  Medication Sig Dispense Refill  . acetaminophen (TYLENOL) 500 MG tablet Take 500 mg by mouth every 6 (six) hours as needed for moderate pain.    [name] ALPRAZolam (XANAX) 0.5 MG tablet Take 1-2 tablets (0.5-1 mg total) by mouth 2 (two) times daily as needed for anxiety. 180 tablet 0  . buPROPion (WELLBUTRIN XL) 150 MG 24 hr tablet TAKE 1 TABLET BY MOUTH  DAILY 90 tablet 1  . Continuous Blood Gluc Receiver (FREESTYLE LIBRE READER) DEVI Inject 1 Stick as directed daily. Use daily to check blood sugar. E11.9 1 Device 0  . escitalopram (LEXAPRO) 20 MG tablet TAKE 1 TABLET BY MOUTH  EVERY MORNING 90 tablet 1  . famotidine (PEPCID) 20 MG tablet Take 1 tablet (20 mg total) by mouth daily. 30 tablet 0  . furosemide (LASIX) 20 MG tablet TAKE 1 TABLET BY MOUTH  DAILY 90 tablet 0  . glucose blood (BAYER  CONTOUR NEXT TEST) test strip Use as instructed to check pt's blood sugar twice a day.  DX E11.9 100 each 5  . glucose blood (ONETOUCH VERIO) test strip USE TO CHECK BLOOD SUGAR TWICE DAILY 100 each 1  . ketoconazole (NIZORAL) 2 % cream Apply 1 application  topically daily. 15 g 3  . letrozole (FEMARA) 2.5 MG tablet Take 1 tablet (2.5 mg total) by mouth daily. 90 tablet 4  . levothyroxine (SYNTHROID, LEVOTHROID) 150 MCG tablet TAKE 1 TABLET BY MOUTH  DAILY 90 tablet 0  . lidocaine-prilocaine (EMLA) cream Apply over port area 1-2 hours before chemotherapy 30 g 0  . metFORMIN (GLUCOPHAGE-XR) 500 MG 24 hr tablet TAKE 2 TABLETS BY MOUTH  EVERY EVENING (Patient taking differently: one in the am and one in the pm) 180 tablet 1  . metroNIDAZOLE (METROGEL) 1 % gel Apply topically daily. 45 g 0  . nadolol (CORGARD) 40 MG tablet Take 1 tablet (40 mg total) by mouth daily. 90 tablet 3  . NONFORMULARY OR COMPOUNDED ITEM Cmp, lipid, hgba1c, tsh--- dx , diabetes II, htn, hyperlipidemia, hypothyroidism 1 each 0  . OLANZapine (ZYPREXA) 2.5 MG tablet TAKE 1 TABLET NIGHTLY AT  BEDTIME 90 tablet 0  . omeprazole (PRILOSEC) 20 MG capsule TAKE 1 CAPSULE BY MOUTH  DAILY 90 capsule 0  . ONETOUCH DELICA LANCETS 00T MISC USE TO CHECK BLOOD SUGAR TWICE DAILY 100 each 0  . potassium chloride (K-DUR,KLOR-CON) 10 MEQ tablet TAKE 1 TABLET BY MOUTH TWO  TIMES DAILY 180 tablet 0  . pravastatin (PRAVACHOL) 20 MG tablet TAKE 1 TABLET BY MOUTH AT  BEDTIME 90 tablet 1  . propranolol (INDERAL) 20 MG tablet Take 1 tablet (20 mg total) by mouth 2 (two) times daily. 180 tablet 3   No current facility-administered medications on file prior to visit.     BP 126/66   Pulse 71   Temp 98 F (36.7 C) (Oral)   Resp 16   Ht '5\' 4"'  (1.626 m)   Wt 199 lb 6.4 oz (90.4 kg)   SpO2 99%   BMI 34.23 kg/m       Objective:   Physical Exam   General- No acute distress. Pleasant patient. Neck- Full range of motion, no jvd Lungs- Clear,  even and unlabored. Heart- regular rate and rhythm. Neurologic- CNII- XII grossly intact.  Skin- rt rib/flank area of redness 14 cm x 14 cm. No blisters. No breadkdown. Warmth and indurated feel to skin.No fluctuance to the area of redness.     Assessment & Plan:  You do appear to have skin infection presently. Due to the wide area affected I decided to give you Rocephin IM injection and also oral antibiotic Bactrim.  I want you to get CBC today to assess infection fighting cells levels.  If area worsens or changes please notify us.  Follow-up thursday 1 PM in the afternoon or as needed. In the event of expansion of red area then recommend emergency department evaluation.   Did get opinion from Dr. Charlett Blake regards to diagnosis and treatment since the area of redness was quite large. Initially wanted to prescribe doxycycline but she has allergic reaction to tetracycline.  Alsace Dowd, Percell Miller, PA-C

## 2017-02-10 NOTE — Patient Instructions (Addendum)
You do appear to have skin infection presently. Due to the wide  area affected I decided to give you Rocephin IM injection and also prescribed oral  antibiotic Bactrim.  I want you to get CBC today to assess infection fighting cells levels.  If area worsens or changes please notify us.   Follow-up thursday 1 PM in the afternoon or as needed. In the event of expansion of red area then recommend emergency department evaluation.

## 2017-02-11 ENCOUNTER — Other Ambulatory Visit: Payer: Self-pay

## 2017-02-11 DIAGNOSIS — K7460 Unspecified cirrhosis of liver: Secondary | ICD-10-CM

## 2017-02-11 NOTE — Progress Notes (Signed)
Called and spoke to pt's husband.  Let him know results and recommendations.  He expressed understanding and they will have labs redone in 2 months, early December, 2018. Labs have been entered and will send pt reminder in December to go to the lab.

## 2017-02-12 ENCOUNTER — Encounter: Payer: Self-pay | Admitting: Medical

## 2017-02-12 ENCOUNTER — Ambulatory Visit (INDEPENDENT_AMBULATORY_CARE_PROVIDER_SITE_OTHER): Payer: Medicare Other | Admitting: Medical

## 2017-02-12 VITALS — BP 126/46 | HR 63 | Temp 98.1°F | Resp 16

## 2017-02-12 DIAGNOSIS — M542 Cervicalgia: Secondary | ICD-10-CM

## 2017-02-12 DIAGNOSIS — R221 Localized swelling, mass and lump, neck: Secondary | ICD-10-CM | POA: Diagnosis not present

## 2017-02-12 DIAGNOSIS — R591 Generalized enlarged lymph nodes: Secondary | ICD-10-CM | POA: Diagnosis not present

## 2017-02-12 DIAGNOSIS — L0390 Cellulitis, unspecified: Secondary | ICD-10-CM | POA: Diagnosis not present

## 2017-02-12 DIAGNOSIS — L089 Local infection of the skin and subcutaneous tissue, unspecified: Secondary | ICD-10-CM | POA: Diagnosis not present

## 2017-02-12 NOTE — Progress Notes (Signed)
Subjective:    Patient ID: [name], female    DOB: 1956-05-07, 61 y.o.   MRN: [id number]  HPI  Pt left flank area rash has improved. No fever, no chills or sweats. Pt admits/states the area is much improved.   At base of left side of neck tender area present since this lump. Had since Sunday. Pain is mild but noticeable. Noted end of interview today.     Review of Systems  Constitutional: Negative for chills and fatigue.  Respiratory: Negative for cough, chest tightness, shortness of breath and wheezing.   Cardiovascular: Negative for chest pain and palpitations.  Gastrointestinal: Negative for vomiting.  Musculoskeletal: Negative for back pain, neck pain and neck stiffness.  Skin: Negative for rash.       Rt rib area skin redness if better.   Neurological: Negative for dizziness, seizures, syncope, speech difficulty, weakness and light-headedness.  Hematological: Positive for adenopathy.       Pt may have node at base of neck that is enlarged(vs small mass).  Psychiatric/Behavioral: Negative for behavioral problems, confusion, dysphoric mood, self-injury and suicidal ideas.    Past Medical History:  Diagnosis Date  . Allergy   . Anemia    low iron hx  . Arthritis   . Breast cancer ([facility]) 2014   a. 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 Cataract    removed ou  . Chronic combined systolic and diastolic CHF (congestive heart failure) ([place])    a. 08/2012 Echo: EF 55-60%, no rwma, mod MR;  b. 07/2014 Echo: EF 45-50%, distal antsept HK, mod TR/MR, mildly bil-atrial enlargement.  . Clear Cell Ovarian Cancer    a. 06/2014 s/p TAH/BSO debulking/lymph node dissection;  b. Now on chemo.  . Depression   . Diabetes mellitus   . DJD (degenerative joint disease) of lumbar spine   . DVT (deep venous thrombosis) ([facility])    hx of on HRT left leg ~2006  . Gallstones   .  GERD (gastroesophageal reflux disease)   . Hyperlipidemia   . Hypertension   . Hypothyroidism   . Liver disease, chronic, with cirrhosis ([place])    ? autoimmune  . OSA on CPAP    cpap 4.5 setting  . Paroxysmal atrial fibrillation ([facility])    a. CHA2DS2VASc = 4-->coumadin.  [name] Physical deconditioning   . PPD positive, treated    rx inh   . Sleep apnea      Social History   Social History  . Marital status: Married    Spouse name: N/A  . Number of children: 2  . Years of education: N/A   Occupational History  . disability [place]   Social History Main Topics  . Smoking status: Former Smoker    Packs/day: 1.00    Years: 1.00  . Smokeless tobacco: Former Systems developer  . Alcohol use No  . Drug use: No     Comment: quit age 55 only smoked as a teen 1 year  . Sexual activity: No   Other Topics Concern  . Not on file   Social History Narrative   Married   Works at [employer] secretary   Daily caffeine use - 2 cups a day plus a couple of sodas a day   Pt doesn't exercise regularly   G2P2   H H of 5 soon to be 6 .  Past Surgical History:  Procedure Laterality Date  . ABDOMINAL HYSTERECTOMY N/A 06/27/2014   Procedure: HYSTERECTOMY ABDOMINAL TOTAL;  Surgeon: Everitt Amber, MD;  [facility];  Service: Gynecology;  Laterality: N/A;  . BREAST BIOPSY     left breast  . BREAST EXCISIONAL BIOPSY Left 2002  . CATARACT EXTRACTION    . CHOLECYSTECTOMY    . EYE SURGERY    . FOOT SURGERY    . history of chemotherapy x 2 treatments, radiation tx  2014  . LAPAROTOMY N/A 06/27/2014   Procedure: EXPLORATORY LAPAROTOMY;  Surgeon: Everitt Amber, MD;  [facility];  Service: Gynecology;  Laterality: N/A;  . MASTECTOMY Right 2014  . MASTECTOMY MODIFIED RADICAL  06/08/2012   Procedure: MASTECTOMY MODIFIED RADICAL;  Surgeon:  Jolly, MD;  Location: [facility];  Service: General;  Laterality: Right;  . pac removed  2015  . PEG TUBE PLACEMENT     peg removed 2015  .  PERCUTANEOUS LIVER BIOPSY    . PORTACATH PLACEMENT  06/08/2012   Procedure: INSERTION PORT-A-CATH;  Surgeon:  Jolly, MD;  Location: [facility];  Service: General;  Laterality: Left;  . SALPINGOOPHORECTOMY Bilateral 06/27/2014   Procedure: BILATERAL SALPINGO OOPHORECTOMY/TUMOR DEBULKING/LYMPHNODE DISSECTION, OMENTECTOMY;  Surgeon: Everitt Amber, MD;  [facility];  Service: Gynecology;  Laterality: Bilateral;  . TUBAL LIGATION      Family History  Problem Relation Age of Onset  . Diabetes Mother   . Hypertension Mother   . Arthritis Mother   . Heart disease Mother   . Heart failure Mother   . Other Mother        benign breast mass  . Stroke Father   . Heart disease Father   . Dementia Father   . Diabetes Paternal Grandmother   . Colon cancer Paternal Grandfather        dx. 70s-80s  . Colon cancer Maternal Grandfather   . Other Daughter        potentially has had negative BRCA testing  . Other Daughter        hysterectomy; potentially has had negative genetic testing  . Rectal cancer Neg Hx   . Esophageal cancer Neg Hx   . Liver cancer Neg Hx     Allergies  Allergen Reactions  . Olmesartan Medoxomil Cough  . Adhesive [Tape] Rash  . Tetracycline Hcl Other (See Comments)  . Venlafaxine Other (See Comments)    severe dry mouth    Current Outpatient Prescriptions on File Prior to Visit  Medication Sig Dispense Refill  . acetaminophen (TYLENOL) 500 MG tablet Take 500 mg by mouth every 6 (six) hours as needed for moderate pain.    [name] ALPRAZolam (XANAX) 0.5 MG tablet Take 1-2 tablets (0.5-1 mg total) by mouth 2 (two) times daily as needed for anxiety. 180 tablet 0  . buPROPion (WELLBUTRIN XL) 150 MG 24 hr tablet TAKE 1 TABLET BY MOUTH  DAILY 90 tablet 1  . Continuous Blood Gluc Receiver (FREESTYLE LIBRE READER) DEVI Inject 1 Stick as directed daily. Use daily to check blood sugar. E11.9 1 Device 0  . escitalopram (LEXAPRO) 20 MG tablet TAKE 1 TABLET BY MOUTH  EVERY  MORNING 90 tablet 1  . famotidine (PEPCID) 20 MG tablet Take 1 tablet (20 mg total) by mouth daily. 30 tablet 0  . furosemide (LASIX) 20 MG tablet TAKE 1 TABLET BY MOUTH  DAILY 90 tablet 0  . glucose blood (BAYER CONTOUR NEXT TEST) test strip Use as instructed  to check pt's blood sugar twice a day.  DX E11.9 100 each 5  . glucose blood (ONETOUCH VERIO) test strip USE TO CHECK BLOOD SUGAR TWICE DAILY 100 each 1  . ketoconazole (NIZORAL) 2 % cream Apply 1 application topically daily. 15 g 3  . letrozole (FEMARA) 2.5 MG tablet Take 1 tablet (2.5 mg total) by mouth daily. 90 tablet 4  . levothyroxine (SYNTHROID, LEVOTHROID) 150 MCG tablet TAKE 1 TABLET BY MOUTH  DAILY 90 tablet 0  . lidocaine-prilocaine (EMLA) cream Apply over port area 1-2 hours before chemotherapy 30 g 0  . metFORMIN (GLUCOPHAGE-XR) 500 MG 24 hr tablet TAKE 2 TABLETS BY MOUTH  EVERY EVENING (Patient taking differently: one in the am and one in the pm) 180 tablet 1  . metroNIDAZOLE (METROGEL) 1 % gel Apply topically daily. 45 g 0  . nadolol (CORGARD) 40 MG tablet Take 1 tablet (40 mg total) by mouth daily. 90 tablet 3  . NONFORMULARY OR COMPOUNDED ITEM Cmp, lipid, hgba1c, tsh--- dx , diabetes II, htn, hyperlipidemia, hypothyroidism 1 each 0  . OLANZapine (ZYPREXA) 2.5 MG tablet TAKE 1 TABLET NIGHTLY AT  BEDTIME 90 tablet 0  . omeprazole (PRILOSEC) 20 MG capsule TAKE 1 CAPSULE BY MOUTH  DAILY 90 capsule 0  . ONETOUCH DELICA LANCETS 00X MISC USE TO CHECK BLOOD SUGAR TWICE DAILY 100 each 0  . potassium chloride (K-DUR,KLOR-CON) 10 MEQ tablet TAKE 1 TABLET BY MOUTH TWO  TIMES DAILY 180 tablet 0  . pravastatin (PRAVACHOL) 20 MG tablet TAKE 1 TABLET BY MOUTH AT  BEDTIME 90 tablet 1  . propranolol (INDERAL) 20 MG tablet Take 1 tablet (20 mg total) by mouth 2 (two) times daily. 180 tablet 3  . sulfamethoxazole-trimethoprim (BACTRIM DS) 800-160 MG tablet Take 1 tablet by mouth 2 (two) times daily. 14 tablet 0   No current  facility-administered medications on file prior to visit.     BP (!) 126/46   Pulse 63   Temp 98.1 F (36.7 C) (Oral)   Resp 16   SpO2 99%       Objective:   Physical Exam  General- No acute distress. Pleasant patient.  Neck- Full range of motion, no jvd At base if left Side of neck/adjacent to sternocleidomastoid small point tender area with possible enlarged lymph node versus small lump/mass. Skin feels faintly indurated in that area. No warmth or tenderness noted.  Lungs- Clear, even and unlabored. Heart- regular rate and rhythm. Neurologic- CNII- XII grossly intact.  Skin- rt rib/flank area of redness 10cm x 10 cm. No blisters. No breadkdown. Much less warmth on palpation the last time. Last indurated feel to skin.No fluctuance to the area of redness.      Assessment & Plan:  Your area of skin infection looks a lot better. I think you have improved significantly. Also your blood work looked good.Continue oral antibiotic.  For your area of neck pain [name] scheduled for tomorrow at 8:30 am  Follow up with on Tuesday to make sure area continues to improve or as needed.(can ask to schedule with me or Dr. Etter Sjogren)

## 2017-02-12 NOTE — Patient Instructions (Addendum)
Your area of skin infection looks a lot better. I think you have improved significantly. Also your blood work looked good. Continue oral antibiotic.  For your area of neck pain [name] scheduled for tomorrow at 8:30 am  Follow up with on Tuesday to make sure area continues to improve or as needed.(can ask to schedule with me or Dr. Etter Sjogren)

## 2017-02-13 ENCOUNTER — Telehealth: Payer: Self-pay | Admitting: Medical

## 2017-02-13 ENCOUNTER — Ambulatory Visit (HOSPITAL_BASED_OUTPATIENT_CLINIC_OR_DEPARTMENT_OTHER)
Admission: RE | Admit: 2017-02-13 | Discharge: 2017-02-13 | Disposition: A | Discharge status: Home / Self Care | Payer: Medicare Other | Source: Ambulatory Visit | Attending: Medical | Admitting: Medical

## 2017-02-13 DIAGNOSIS — M542 Cervicalgia: Secondary | ICD-10-CM | POA: Diagnosis present

## 2017-02-13 DIAGNOSIS — R221 Localized swelling, mass and lump, neck: Secondary | ICD-10-CM | POA: Diagnosis not present

## 2017-02-13 DIAGNOSIS — R591 Generalized enlarged lymph nodes: Secondary | ICD-10-CM

## 2017-02-13 NOTE — Telephone Encounter (Signed)
No I sent oncologist copy of [name] report  and then saw my last 2 patients. I am going to try to call now at 8:16 or so next few minutes. Gave me cell number as well.  When I sent message to oncologist note popped up saying won't be available until 02-15-2017.

## 2017-02-13 NOTE — Telephone Encounter (Signed)
Did you get a response from the oncologist ?

## 2017-02-13 NOTE — Telephone Encounter (Signed)
Ok great.

## 2017-02-13 NOTE — Telephone Encounter (Signed)
Pt spouse called again for [name] results. Assured pt Mackie Pai will return call today.

## 2017-02-13 NOTE — Telephone Encounter (Signed)
I did finally get to speak with pt husband. He seemed ok with list of differential but did explain we are waiting on word from oncologist regarding their opinion and will update him when oncology gets back with [name].

## 2017-02-13 NOTE — Telephone Encounter (Signed)
Spouse called for Ultrasound results from this morning.

## 2017-02-13 NOTE — Telephone Encounter (Signed)
I called patient//husband back at 5:20 in afternoon after I saw my last patient. I saw that they have tried various times to call and get the ultrasound results. Unfortunately I was seeing patients all this morning and this afternoon as well. In addition had a very brief time to eat lunch that prohibited me from calling as well. I saw the results of the ultrasound in the afternoon and then was occupied with patient care. I'm going to attempt to call again your briefly. I reviewed the chart and we only have one number to call.   Again called and no answer 5:28. Had to leave another message. So gave my cell number and offered pt and husband that they can call me. Will also try to call them later this weekend as I now they are concerned.

## 2017-02-15 ENCOUNTER — Other Ambulatory Visit: Payer: Self-pay | Admitting: Oncology

## 2017-02-18 ENCOUNTER — Telehealth: Payer: Self-pay | Admitting: Oncology

## 2017-02-18 NOTE — Telephone Encounter (Signed)
Spoke with the patient's husband regarding the appointment that was scheduled per 10/10 sch msg.

## 2017-02-19 ENCOUNTER — Ambulatory Visit (HOSPITAL_BASED_OUTPATIENT_CLINIC_OR_DEPARTMENT_OTHER): Payer: Medicare Other | Admitting: Adult Health

## 2017-02-19 VITALS — BP 128/46 | HR 67 | Temp 97.8°F | Resp 18 | Ht 64.0 in | Wt 200.1 lb

## 2017-02-19 DIAGNOSIS — R221 Localized swelling, mass and lump, neck: Secondary | ICD-10-CM | POA: Diagnosis not present

## 2017-02-19 DIAGNOSIS — Z170 Estrogen receptor positive status [ER+]: Secondary | ICD-10-CM | POA: Diagnosis not present

## 2017-02-19 DIAGNOSIS — R918 Other nonspecific abnormal finding of lung field: Secondary | ICD-10-CM | POA: Diagnosis not present

## 2017-02-19 DIAGNOSIS — C50811 Malignant neoplasm of overlapping sites of right female breast: Secondary | ICD-10-CM | POA: Diagnosis not present

## 2017-02-19 DIAGNOSIS — Z79811 Long term (current) use of aromatase inhibitors: Secondary | ICD-10-CM | POA: Diagnosis not present

## 2017-02-19 DIAGNOSIS — C562 Malignant neoplasm of left ovary: Secondary | ICD-10-CM

## 2017-02-19 MED ORDER — ONDANSETRON HCL 8 MG PO TABS: 8.0000 mg | ORAL_TABLET | Freq: Three times a day (TID) | ORAL | 0 refills | Status: DC | PRN

## 2017-02-19 NOTE — Progress Notes (Addendum)
ID: [name]   DOB: 29-Jan-1956  MR#: [id number]  XLK#:[id number]  PCP: Ann Held, DO GYN:  SU: [facility] OTHER DG:UYQIHK [name], Shanon Ace, Roney Jaffe  CHIEF COMPLAINT:  Estrogen receptor positive Breast Cancer; Ovarian cancer; DVT  CURRENT TREATMENT: Letrozole  INTERVAL HISTORY: [name] is here for evaluation of a lesion on her neck that they are unsure if needs biopsy.  About 2 weeks ago seh developed a rash and then she noted that she had a lump in her neck on the left side.  She thinks it is getting smaller when she feels on it.  She is here to figure out if it needs to be biopsied.  No cats in the home.  An ultrasound on 10/5 demonstrated a 1.5cm nodule.    REVIEW OF SYSTEMS: Other than what is noted above a detailed ROS is non contributory.    BREAST CANCER HISTORY: From the original consult note:  [name] noted a mass in her right breast mid December 2013, and as it did not spontaneously resolve over a couple of weeks she brought it to her primary physician's attention. She was set up for diagnostic mammography and right breast ultrasonography at the breast Center 05/10/2012. (Note that the patient's most recent prior mammography had been in October 2008). The current study showed a spiculated mass in the superior subareolar portion of the right breast measuring approximately 5 cm and associated with pleomorphic calcifications. This was firm and palpable. There was right nipple retraction and skin thickening. Ultrasound confirmed an irregularly marginated hypoechoic mass measuring 3.5 cm by ultrasound, and an abnormal appearing lower right axillary lymph node measuring 2.6 cm.  Biopsies of both the breast mass and the abnormal appearing lymph node were performed 05/21/2012. Both showed an invasive ductal carcinoma, grade 2, with similar prognostic panels (the breast mass was 100% estrogen and 73% progesterone receptor positive, with an MIB-1  of 5%; the lymph node was 100% estrogen 100% progesterone receptor positive, with an MIB-1 of 20%). Both masses were HER-2 negative.  Breast MRI obtained at [facility] 05/29/2012 confirmed a dominant mass in the retroareolar right breast measuring 4.4 cm maximally. There was a satellite nodule inferior and lateral to this mass, measuring 1.7 cm. There were no other masses in either breast. Aside from the previously biopsied lymph node there were other mildly enhancing level I right axillary lymph nodes which did not appear pathologic. There was no other lymphadenopathy noted.   The patient's subsequent history is as detailed below.  OVARIAN CANCER HISTORY: From the 06/16/2014 summary note:  "[name] returns today for review of her restaging studies accompanied by her husband [name]. To summarize her recent history: She was admitted to [facility] with a diagnosis of possible pneumonia in late December 2015. Chest x-ray showed some increased density in the right upper lobe. CT scan was suggested and was obtained by Dr. Inda Castle. This showed 2 small nodules in the right middle lobe, measuring 8 and 4 mm respectively. Dr. Inda Castle then contacted [name] for further evaluation and we set Abbigaile up for a PET scan and which was performed late January. This showed the 2 nodules in the lung not to be hypermetabolic. This of course does not prove that they are not malignant. More importantly, there was a 13.7 cm cystic/solid mass in the upper pelvis associated with adenopathy,. All of this was hypermetabolic."  The patient was evaluated by gynecologic oncology and on 06/27/2014 she  underwent surgery under Dr. Denman George. This consisted of an exploratory laparotomy with hysterectomy and abdominal salpingo-oophorectomy, as well as tumor debulking and omentectomy. 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 The tumor  measured 11.5 cm, focally involve the ovarian capsule on the left; the uterus and right ovary and fallopian tube were unremarkable. One para-aortic lymph node was positive out of a total of 6 lymph nodes sampled (2 left common iliac, 2 left para-aortic, and to within the omental resection).  Her subsequent history is as detailed below   PAST MEDICAL HISTORY: Past Medical History:  Diagnosis Date  . Allergy   . Anemia    low iron hx  . Arthritis   . Breast cancer ([place]) 2014   a. 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 Cataract    removed ou  . Chronic combined systolic and diastolic CHF (congestive heart failure) ([place])    a. 08/2012 Echo: EF 55-60%, no rwma, mod MR;  b. 07/2014 Echo: EF 45-50%, distal antsept HK, mod TR/MR, mildly bil-atrial enlargement.  . Clear Cell Ovarian Cancer    a. 06/2014 s/p TAH/BSO debulking/lymph node dissection;  b. Now on chemo.  . Depression   . Diabetes mellitus   . DJD (degenerative joint disease) of lumbar spine   . DVT (deep venous thrombosis) (H[facility])    hx of on HRT left leg ~2006  . Gallstones   . GERD (gastroesophageal reflux disease)   . Hyperlipidemia   . Hypertension   . Hypothyroidism   . Liver disease, chronic, with cirrhosis ([place])    ? autoimmune  . OSA on CPAP    cpap 4.5 setting  . Paroxysmal atrial fibrillation (H[facility])    a. CHA2DS2VASc = 4-->coumadin.  [name] Physical deconditioning   . PPD positive, treated    rx inh   . Sleep apnea     PAST SURGICAL HISTORY: Past Surgical History:  Procedure Laterality Date  . ABDOMINAL HYSTERECTOMY N/A 06/27/2014   Procedure: HYSTERECTOMY ABDOMINAL TOTAL;  Surgeon: Everitt Amber, MD;  Location: [facility];  Service: Gynecology;  Laterality: N/A;  . BREAST BIOPSY     left breast  . BREAST EXCISIONAL BIOPSY Left 2002  . CATARACT EXTRACTION    . CHOLECYSTECTOMY    . EYE SURGERY    . FOOT SURGERY     . history of chemotherapy x 2 treatments, radiation tx  2014  . LAPAROTOMY N/A 06/27/2014   Procedure: EXPLORATORY LAPAROTOMY;  Surgeon: Everitt Amber, MD;  Location: [facility];  Service: Gynecology;  Laterality: N/A;  . MASTECTOMY Right 2014  . MASTECTOMY MODIFIED RADICAL  06/08/2012   Procedure: MASTECTOMY MODIFIED RADICAL;  Surgeon: Edward Jolly, MD;  Location: [place];  Service: General;  Laterality: Right;  . pac removed  2015  . PEG TUBE PLACEMENT     peg removed 2015  . PERCUTANEOUS LIVER BIOPSY    . PORTACATH PLACEMENT  06/08/2012   Procedure: INSERTION PORT-A-CATH;  Surgeon: Edward Jolly, MD;  Location: [place];  Service: General;  Laterality: Left;  . SALPINGOOPHORECTOMY Bilateral 06/27/2014   Procedure: BILATERAL SALPINGO OOPHORECTOMY/TUMOR DEBULKING/LYMPHNODE DISSECTION, OMENTECTOMY;  Surgeon: Everitt Amber, MD;  Location: [facility];  Service: Gynecology;  Laterality: Bilateral;  . TUBAL LIGATION      FAMILY HISTORY Family History  Problem Relation Age of Onset  . Diabetes  Mother   . Hypertension Mother   . Arthritis Mother   . Heart disease Mother   . Heart failure Mother   . Other Mother        benign breast mass  . Stroke Father   . Heart disease Father   . Dementia Father   . Diabetes Paternal Grandmother   . Colon cancer Paternal Grandfather        dx. 70s-80s  . Colon cancer Maternal Grandfather   . Other Daughter        potentially has had negative BRCA testing  . Other Daughter        hysterectomy; potentially has had negative genetic testing  . Rectal cancer Neg Hx   . Esophageal cancer Neg Hx   . Liver cancer Neg Hx    the patient's father died in his 18s with a history of dementia. He had had prior strokes. The patient's mother died in her 45s, with a history of congestive heart failure. Mandie had no brothers, one sister. There is no history of breast or ovarian cancer in the family.  GYNECOLOGIC HISTORY: Menarche age 34, first live birth age 89, she  is GX P2, menopause approximately 15 years ago, on hormone replacement until 2010.  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 Her husband [name] works for [employer]. Daughter Paul Half is a Physiological scientist and lives in [place]. Daughter Santiago Bur and her family (husband and 2 children aged 23 and 3 years) currently live with the patient. Selene is a member of a [employer].   ADVANCED DIRECTIVES: Not in place  HEALTH MAINTENANCE: (Updated October 2014) Social History  Substance Use Topics  . Smoking status: Former Smoker    Packs/day: 1.00    Years: 1.00  . Smokeless tobacco: Former Systems developer  . Alcohol use No     Colonoscopy: Never  PAP: Does not recall  Bone density: Never  Lipid panel:   Allergies  Allergen Reactions  . Olmesartan Medoxomil Cough  . Adhesive [Tape] Rash  . Tetracycline Hcl Other (See Comments)  . Venlafaxine Other (See Comments)    severe dry mouth    Current Outpatient Prescriptions  Medication Sig Dispense Refill  . acetaminophen (TYLENOL) 500 MG tablet Take 500 mg by mouth every 6 (six) hours as needed for moderate pain.    [name] ALPRAZolam (XANAX) 0.5 MG tablet Take 1-2 tablets (0.5-1 mg total) by mouth 2 (two) times daily as needed for anxiety. 180 tablet 0  . buPROPion (WELLBUTRIN XL) 150 MG 24 hr tablet TAKE 1 TABLET BY MOUTH  DAILY 90 tablet 1  . Continuous Blood Gluc Receiver (FREESTYLE LIBRE READER) DEVI Inject 1 Stick as directed daily. Use daily to check blood sugar. E11.9 1 Device 0  . escitalopram (LEXAPRO) 20 MG tablet TAKE 1 TABLET BY MOUTH  EVERY MORNING 90 tablet 1  . famotidine (PEPCID) 20 MG tablet Take 1 tablet (20 mg total) by mouth daily. 30 tablet 0  . furosemide (LASIX) 20 MG tablet TAKE 1 TABLET BY MOUTH  DAILY 90 tablet 0  . glucose blood (BAYER CONTOUR NEXT TEST) test strip Use as instructed to check pt's  blood sugar twice a day.  DX E11.9 100 each 5  . glucose blood (ONETOUCH VERIO) test strip USE TO CHECK BLOOD SUGAR TWICE DAILY 100 each 1  . ketoconazole (NIZORAL) 2 % cream Apply 1  application topically daily. 15 g 3  . letrozole (FEMARA) 2.5 MG tablet Take 1 tablet (2.5 mg total) by mouth daily. 90 tablet 4  . levothyroxine (SYNTHROID, LEVOTHROID) 150 MCG tablet TAKE 1 TABLET BY MOUTH  DAILY 90 tablet 0  . lidocaine-prilocaine (EMLA) cream Apply over port area 1-2 hours before chemotherapy 30 g 0  . metFORMIN (GLUCOPHAGE-XR) 500 MG 24 hr tablet TAKE 2 TABLETS BY MOUTH  EVERY EVENING (Patient taking differently: one in the am and one in the pm) 180 tablet 1  . metroNIDAZOLE (METROGEL) 1 % gel Apply topically daily. 45 g 0  . nadolol (CORGARD) 40 MG tablet Take 1 tablet (40 mg total) by mouth daily. 90 tablet 3  . NONFORMULARY OR COMPOUNDED ITEM Cmp, lipid, hgba1c, tsh--- dx , diabetes II, htn, hyperlipidemia, hypothyroidism 1 each 0  . OLANZapine (ZYPREXA) 2.5 MG tablet TAKE 1 TABLET NIGHTLY AT  BEDTIME 90 tablet 0  . omeprazole (PRILOSEC) 20 MG capsule TAKE 1 CAPSULE BY MOUTH  DAILY 90 capsule 0  . ONETOUCH DELICA LANCETS 31V MISC USE TO CHECK BLOOD SUGAR TWICE DAILY 100 each 0  . potassium chloride (K-DUR,KLOR-CON) 10 MEQ tablet TAKE 1 TABLET BY MOUTH TWO  TIMES DAILY 180 tablet 0  . pravastatin (PRAVACHOL) 20 MG tablet TAKE 1 TABLET BY MOUTH AT  BEDTIME 90 tablet 1  . propranolol (INDERAL) 20 MG tablet Take 1 tablet (20 mg total) by mouth 2 (two) times daily. 180 tablet 3  . sulfamethoxazole-trimethoprim (BACTRIM DS) 800-160 MG tablet Take 1 tablet by mouth 2 (two) times daily. 14 tablet 0   No current facility-administered medications for this visit.     OBJECTIVE:  Vitals:   02/19/17 0852  BP: (!) 128/46  Pulse: 67  Resp: 18  Temp: 97.8 F (36.6 C)  SpO2: 96%     Body mass index is 34.35 kg/m.    ECOG FS: 2 Filed Weights   02/19/17 0852  Weight: 200 lb 1.6 oz (90.8 kg)    GENERAL: Patient is a well appearing female in no acute distress HEENT:  Sclerae anicteric.  Oropharynx clear and moist. No ulcerations or evidence of oropharyngeal candidiasis. Neck is supple.  NODES:  No cervical, supraclavicular, or axillary lymphadenopathy palpated. Area of thickness noted adjacent to port, ? Scar tissue BREAST EXAM:  Deferred. LUNGS:  Clear to auscultation bilaterally.  No wheezes or rhonchi. HEART:  Regular rate and rhythm. No murmur appreciated. ABDOMEN:  Soft, nontender.  Positive, normoactive bowel sounds. No organomegaly palpated. MSK:  No focal spinal tenderness to palpation. Full range of motion bilaterally in the upper extremities. EXTREMITIES:  No peripheral edema.   SKIN:  Clear with no obvious rashes or skin changes. No nail dyscrasia. NEURO:  Nonfocal. Well oriented.  Appropriate affect.      Lab Results  Component Value Date   WBC 7.4 02/10/2017   NEUTROABS 5.7 02/10/2017   HGB 13.6 02/10/2017   HCT 40.7 02/10/2017   MCV 99.1 02/10/2017   PLT 92.0 (L) 02/10/2017     Chemistry      Component Value Date/Time   NA 141 12/22/2016 1144   K 3.5 12/22/2016 1144   CL 107 03/21/2016 0854   CL 104 10/29/2012 1058   CO2 24 12/22/2016 1144   BUN 5.5 (L) 12/22/2016 1144   CREATININE 0.6 12/22/2016 1144      Component Value Date/Time   CALCIUM 8.8 12/22/2016 1144   ALKPHOS 150 (H) 02/06/2017 0945   ALKPHOS 158 (  H) 12/22/2016 1144   AST 56 (H) 02/06/2017 0945   AST 83 (H) 12/22/2016 1144   ALT 23 02/06/2017 0945   ALT 34 12/22/2016 1144   BILITOT 2.0 (H) 02/06/2017 0945   BILITOT 1.95 (H) 12/22/2016 1144      STUDIES: US Soft Tissue Head/neck  Result Date: 02/13/2017 CLINICAL DATA:  61 year old female with a history of neck pain and lymphadenopathy. Port catheter on prior chest x-ray 10/17/2016 EXAM: ULTRASOUND OF HEAD/NECK SOFT TISSUES TECHNIQUE: Ultrasound examination of the head and neck soft tissues was performed in the area of clinical  concern. COMPARISON:  Chest x-ray 10/17/2016 FINDINGS: Grayscale and color duplex performed in the region of clinical concern. Adjacent to the plastic catheter of the port catheter, there is no focal fluid collection. There is an ill-defined isoechoic nodule within the soft tissues measured, with the greatest diameter 1.5 cm. This does not have the typical characteristics of a lymph node. IMPRESSION: Ultrasound survey in the region of clinical concern at the left neck adjacent to subcutaneous port catheter demonstrates ill-defined soft tissue nodule designated 1.5 cm in diameter. Findings are nonspecific, potentially old hematoma, granuloma, atypical lipoma, or less likely a lymph node. If there is concern for recurrent or progressive malignancy, recommend referral for oncologic evaluation. Electronically Signed   By: Corrie Mckusick D.O.   On: 02/13/2017 09:05     ASSESSMENT: 61 y.o.  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    (2)  treated in the adjuvant setting with docetaxel/ cyclophosphamide given every 3 weeks. There were multiple and severe complications, and the  patient tolerated only two cycles, last dose 07/29/2012  (3) letrozole started 08/16/2012  (a) osteopenia, with a T score of -1.14 April 2013  (4) postmastectomy radiation completed 12/24/2012  (5)  comorbidities include diabetes, hypertension, chronic liver disease with cirrhosis and fatty liver, and nutritional disturbance with temporarily dependence on PEG feeds (discontinued July 2014).  (6) restaging studies January 2016 show  (a) 8 mm and 4 mm Right middle lobe lung nodules, not metabolically active on PET -- will require follow-up  (b) hypermetabolic 47.6 cm mixed solid/cystic lesion in upper pelvis,  with associated adenopathy  OVARIAN CANCER (7) status post exploratory laparotomy 06/27/2014 with total abdominal hysterectomy, bilateral salpingo-oophorectomy, para-aortic lymphadenectomy, omentectomy and radical tumor debulking for a left-sided clear cell ovarian cancer, pT1c pN1, stage IIIC .  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 despite being on Coumadin for PAF (subtherapeutic INR) with mobile Right common femoral clot  (a) status post thrombolysis 09/30/2014  (b) on Lovenox daily as of 10/02/2014, stopped April 2017  (c) IVC filter placed 10/26/2014 as mobile clot still noted Right groin  (d) IVC filter removed 01/30/2015 with documented resolution of the movable thrombus 01/16/2015  (e) normal quantitative D-dimer 08/06/2015 and 09/24/2015  (10) started abraxane 10/13/2014, repeated every 14 days for 6 cycles (12 doses), completed 03/26/2015  (11) genetics testing December 04, 2014 through the  Breast/Ovarian Cancer Panel [facility] no deleterious mutations in ATM, BARD1, BRCA1, BRCA2, BRIP1, CDH1, CHEK2, FANCC, MLH1, MSH2, MSH6, NBN, PALB2, PMS2, PTEN, RAD51C, RAD51D, STK11, TP53, and XRCC2.    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  She continues on Letrozole and continues to be tolerating this well.  They did request some nausea medicine be called in.  They like to have this on hand in case they need it, and so I sent  Ondansetron in for them today.  Dr. Jana Hakim came in today and evaluated the patient, and feels the area adjacent to the port is likely scar tissue.  She does have lung nodules on CT scans, and with this new area, he wants to take a closer look with CT neck and chest for full eval.  She will have this done, and return later this month for labs, f/u with him.  She does have a CA 125 ordered to be drawn at the time of that appointment.  She knows to call for any other issues that may develop before her next visit.  Scot Dock, NP    02/19/2017    ADDENDUM: Ahlani actually looks the best that I have seen her in a long time. I am hopeful if she is able to get her knee surgery done as planned she will become more physically active.  The area of concern associated with her port does not feel like cancer. It is not hard it is not nodular and it is not raised or erythematous. It does feel like soft scar tissue.  Nevertheless I think it would be prudent to obtain a CT of the chest and neck at this point and those orders have been placed.  Otherwise she will return to see me in 03/10/2017 to discuss results of those tests as well as a repeat CA-125.  I personally saw this patient and performed a substantive portion of this encounter with the listed APP documented above.   Chauncey Cruel, MD Medical Oncology and Hematology [facility] [address] [place], [place] [address] Tel. [phone]    Fax. [phone]

## 2017-02-20 ENCOUNTER — Telehealth: Payer: Self-pay | Admitting: Oncology

## 2017-02-20 ENCOUNTER — Encounter: Payer: Self-pay | Admitting: Family Medicine

## 2017-02-20 ENCOUNTER — Other Ambulatory Visit: Payer: Self-pay

## 2017-02-20 ENCOUNTER — Telehealth: Payer: Self-pay | Admitting: Adult Health

## 2017-02-20 ENCOUNTER — Ambulatory Visit (INDEPENDENT_AMBULATORY_CARE_PROVIDER_SITE_OTHER): Payer: Medicare Other | Admitting: Family Medicine

## 2017-02-20 DIAGNOSIS — I10 Essential (primary) hypertension: Secondary | ICD-10-CM | POA: Diagnosis not present

## 2017-02-20 DIAGNOSIS — C50811 Malignant neoplasm of overlapping sites of right female breast: Secondary | ICD-10-CM

## 2017-02-20 DIAGNOSIS — Z170 Estrogen receptor positive status [ER+]: Secondary | ICD-10-CM

## 2017-02-20 DIAGNOSIS — E785 Hyperlipidemia, unspecified: Secondary | ICD-10-CM | POA: Diagnosis not present

## 2017-02-20 DIAGNOSIS — E1165 Type 2 diabetes mellitus with hyperglycemia: Secondary | ICD-10-CM

## 2017-02-20 DIAGNOSIS — E1129 Type 2 diabetes mellitus with other diabetic kidney complication: Secondary | ICD-10-CM | POA: Diagnosis not present

## 2017-02-20 DIAGNOSIS — IMO0002 Reserved for concepts with insufficient information to code with codable children: Secondary | ICD-10-CM

## 2017-02-20 DIAGNOSIS — E039 Hypothyroidism, unspecified: Secondary | ICD-10-CM | POA: Diagnosis not present

## 2017-02-20 DIAGNOSIS — Z23 Encounter for immunization: Secondary | ICD-10-CM | POA: Diagnosis not present

## 2017-02-20 DIAGNOSIS — C562 Malignant neoplasm of left ovary: Secondary | ICD-10-CM

## 2017-02-20 LAB — LIPID PANEL
CHOL/HDL RATIO: 5
Cholesterol: 119 mg/dL (ref 0–200)
HDL: 22.2 mg/dL — AB (ref >39.00)
LDL CALC: 72 mg/dL (ref 0–99)
NONHDL: 96.45
TRIGLYCERIDES: 124 mg/dL (ref 0.0–149.0)
VLDL: 24.8 mg/dL (ref 0.0–40.0)

## 2017-02-20 LAB — COMPREHENSIVE METABOLIC PANEL
ALT: 25 U/L (ref 0–35)
AST: 60 U/L — ABNORMAL HIGH (ref 0–37)
Albumin: 2.9 g/dL — ABNORMAL LOW (ref 3.5–5.2)
Alkaline Phosphatase: 143 U/L — ABNORMAL HIGH (ref 39–117)
BILIRUBIN TOTAL: 1.6 mg/dL — AB (ref 0.2–1.2)
BUN: 6 mg/dL (ref 6–23)
CALCIUM: 7.7 mg/dL — AB (ref 8.4–10.5)
CO2: 23 meq/L (ref 19–32)
Chloride: 108 mEq/L (ref 96–112)
Creatinine, Ser: 0.46 mg/dL (ref 0.40–1.20)
GFR: 145.84 mL/min (ref >60.00)
GLUCOSE: 82 mg/dL (ref 70–99)
POTASSIUM: 3.7 meq/L (ref 3.5–5.1)
Sodium: 142 mEq/L (ref 135–145)
Total Protein: 6.5 g/dL (ref 6.0–8.3)

## 2017-02-20 LAB — HEMOGLOBIN A1C: Hgb A1c MFr Bld: 4.9 % (ref 4.6–6.5)

## 2017-02-20 LAB — TSH: TSH: 0.99 u[IU]/mL (ref 0.35–4.50)

## 2017-02-20 MED ORDER — ONDANSETRON HCL 8 MG PO TABS: 8.0000 mg | ORAL_TABLET | Freq: Three times a day (TID) | ORAL | 0 refills | Status: DC | PRN

## 2017-02-20 NOTE — Telephone Encounter (Signed)
02/19/17 prescription refill scanned in media °

## 2017-02-20 NOTE — Patient Instructions (Signed)

## 2017-02-20 NOTE — Assessment & Plan Note (Signed)
Well controlled, no changes to meds. Encouraged heart healthy diet such as the DASH diet and exercise as tolerated.  °

## 2017-02-20 NOTE — Progress Notes (Signed)
Patient ID: [name], female    DOB: 10/16/1955  Age: 61 y.o. MRN: [id number]    Subjective:  Subjective  HPI [name] presents for, htn and cholesterol.  No complaints.   HPI HYPERTENSION   Blood pressure range-not checked   Chest pain- no      Dyspnea- no Lightheadedness- no   Edema- no  Other side effects - no   Medication compliance: good Low salt diet- yes     DIABETES    Blood Sugar ranges-80s--  190 Polyuria- no New Visual problems- no  Hypoglycemic symptoms- no  Other side effects-no Medication compliance - good Last eye exam- few montths ago Foot exam- today   HYPERLIPIDEMIA  Medication compliance- good RUQ pain- no  Muscle aches- no Other side effects-no     Review of Systems  Constitutional: Negative for appetite change, diaphoresis, fatigue and unexpected weight change.  Eyes: Negative for pain, redness and visual disturbance.  Respiratory: Negative for cough, chest tightness, shortness of breath and wheezing.   Cardiovascular: Negative for chest pain, palpitations and leg swelling.  Endocrine: Negative for cold intolerance, heat intolerance, polydipsia, polyphagia and polyuria.  Genitourinary: Negative for difficulty urinating, dysuria and frequency.  Neurological: Negative for dizziness, light-headedness, numbness and headaches.    History Past Medical History:  Diagnosis Date  . Allergy   . Anemia    low iron hx  . Arthritis   . Breast cancer ([place]) 2014   a. 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 Cataract    removed ou  . Chronic combined systolic and diastolic CHF (congestive heart failure) ([place])    a. 08/2012 Echo: EF 55-60%, no rwma, mod MR;  b. 07/2014 Echo: EF 45-50%, distal antsept HK, mod TR/MR, mildly bil-atrial enlargement.  . Clear Cell Ovarian Cancer    a. 06/2014 s/p TAH/BSO debulking/lymph node dissection;  b.  Now on chemo.  . Depression   . Diabetes mellitus   . DJD (degenerative joint disease) of lumbar spine   . DVT (deep venous thrombosis) ([facility])    hx of on HRT left leg ~2006  . Gallstones   . GERD (gastroesophageal reflux disease)   . Hyperlipidemia   . Hypertension   . Hypothyroidism   . Liver disease, chronic, with cirrhosis ([place])    ? autoimmune  . OSA on CPAP    cpap 4.5 setting  . Paroxysmal atrial fibrillation ([facility])    a. CHA2DS2VASc = 4-->coumadin.  [name] Physical deconditioning   . PPD positive, treated    rx inh   . Sleep apnea     She has a past surgical history that includes Tubal ligation; Cholecystectomy; Foot surgery; Eye surgery; Cataract extraction; Percutaneous liver biopsy; Breast biopsy; Mastectomy modified radical (06/08/2012); Portacath placement (06/08/2012); PEG tube placement; history of chemotherapy x 2 treatments, radiation tx (2014); pac removed (2015); laparotomy (N/A, 06/27/2014); Abdominal hysterectomy (N/A, 06/27/2014); Salpingoophorectomy (Bilateral, 06/27/2014); Breast excisional biopsy (Left, 2002); and Mastectomy (Right, 2014).   Her family history includes Arthritis in her mother; Colon cancer in her maternal grandfather and paternal grandfather; Dementia in her father; Diabetes in her mother and paternal grandmother; Heart disease in her father and mother; Heart failure in her mother; Hypertension in her mother; Other in her daughter, daughter, and mother; Stroke in her father.She reports that she has quit smoking. She has a 1.00 pack-year smoking history. She has quit using  smokeless tobacco. She reports that she does not drink alcohol or use drugs.  Current Outpatient Prescriptions on File Prior to Visit  Medication Sig Dispense Refill  . acetaminophen (TYLENOL) 500 MG tablet Take 500 mg by mouth every 6 (six) hours as needed for moderate pain.    [name] ALPRAZolam (XANAX) 0.5 MG tablet Take 1-2 tablets (0.5-1 mg total) by mouth 2 (two) times daily as needed for  anxiety. 180 tablet 0  . buPROPion (WELLBUTRIN XL) 150 MG 24 hr tablet TAKE 1 TABLET BY MOUTH  DAILY 90 tablet 1  . Continuous Blood Gluc Receiver (FREESTYLE LIBRE READER) DEVI Inject 1 Stick as directed daily. Use daily to check blood sugar. E11.9 1 Device 0  . escitalopram (LEXAPRO) 20 MG tablet TAKE 1 TABLET BY MOUTH  EVERY MORNING 90 tablet 1  . famotidine (PEPCID) 20 MG tablet Take 1 tablet (20 mg total) by mouth daily. 30 tablet 0  . furosemide (LASIX) 20 MG tablet TAKE 1 TABLET BY MOUTH  DAILY 90 tablet 0  . glucose blood (BAYER CONTOUR NEXT TEST) test strip Use as instructed to check pt's blood sugar twice a day.  DX E11.9 100 each 5  . glucose blood (ONETOUCH VERIO) test strip USE TO CHECK BLOOD SUGAR TWICE DAILY 100 each 1  . ketoconazole (NIZORAL) 2 % cream Apply 1 application topically daily. 15 g 3  . letrozole (FEMARA) 2.5 MG tablet Take 1 tablet (2.5 mg total) by mouth daily. 90 tablet 4  . levothyroxine (SYNTHROID, LEVOTHROID) 150 MCG tablet TAKE 1 TABLET BY MOUTH  DAILY 90 tablet 0  . lidocaine-prilocaine (EMLA) cream Apply over port area 1-2 hours before chemotherapy 30 g 0  . metFORMIN (GLUCOPHAGE-XR) 500 MG 24 hr tablet TAKE 2 TABLETS BY MOUTH  EVERY EVENING (Patient taking differently: one in the am and one in the pm) 180 tablet 1  . metroNIDAZOLE (METROGEL) 1 % gel Apply topically daily. 45 g 0  . nadolol (CORGARD) 40 MG tablet Take 1 tablet (40 mg total) by mouth daily. 90 tablet 3  . NONFORMULARY OR COMPOUNDED ITEM Cmp, lipid, hgba1c, tsh--- dx , diabetes II, htn, hyperlipidemia, hypothyroidism 1 each 0  . OLANZapine (ZYPREXA) 2.5 MG tablet TAKE 1 TABLET NIGHTLY AT  BEDTIME 90 tablet 0  . omeprazole (PRILOSEC) 20 MG capsule TAKE 1 CAPSULE BY MOUTH  DAILY 90 capsule 0  . ONETOUCH DELICA LANCETS 16R MISC USE TO CHECK BLOOD SUGAR TWICE DAILY 100 each 0  . potassium chloride (K-DUR,KLOR-CON) 10 MEQ tablet TAKE 1 TABLET BY MOUTH TWO  TIMES DAILY 180 tablet 0  . pravastatin  (PRAVACHOL) 20 MG tablet TAKE 1 TABLET BY MOUTH AT  BEDTIME 90 tablet 1  . propranolol (INDERAL) 20 MG tablet Take 1 tablet (20 mg total) by mouth 2 (two) times daily. 180 tablet 3  . sulfamethoxazole-trimethoprim (BACTRIM DS) 800-160 MG tablet Take 1 tablet by mouth 2 (two) times daily. 14 tablet 0   No current facility-administered medications on file prior to visit.      Objective:  Objective  Physical Exam  Constitutional: She is oriented to person, place, and time. She appears well-developed and well-nourished.  HENT:  Head: Normocephalic and atraumatic.  Eyes: Conjunctivae and EOM are normal.  Neck: Normal range of motion. Neck supple. No JVD present. Carotid bruit is not present. No thyromegaly present.  Cardiovascular: Normal rate, regular rhythm and normal heart sounds.   No murmur heard. Pulmonary/Chest: Effort normal and breath sounds normal. No respiratory  distress. She has no wheezes. She has no rales. She exhibits no tenderness.  Neurological: She is alert and oriented to person, place, and time.  Psychiatric: She has a normal mood and affect.  Nursing note and vitals reviewed.  BP (!) 110/58   Pulse 69   Temp 98 F (36.7 C) (Oral)   Resp 16   Ht 5\' 4"  (1.626 m)   Wt 203 lb 12.8 oz (92.4 kg)   SpO2 97%   BMI 34.98 kg/m  Wt Readings from Last 3 Encounters:  02/20/17 203 lb 12.8 oz (92.4 kg)  02/19/17 200 lb 1.6 oz (90.8 kg)  02/10/17 199 lb 6.4 oz (90.4 kg)     Lab Results  Component Value Date   WBC 7.4 02/10/2017   HGB 13.6 02/10/2017   HCT 40.7 02/10/2017   PLT 92.0 (L) 02/10/2017   GLUCOSE 39 (LL) 12/22/2016   CHOL 135 10/10/2016   TRIG 110.0 10/10/2016   HDL 30.20 (L) 10/10/2016   LDLDIRECT 160.2 09/23/2010   LDLCALC 83 10/10/2016   ALT 23 02/06/2017   AST 56 (H) 02/06/2017   NA 141 12/22/2016   K 3.5 12/22/2016   CL 107 03/21/2016   CREATININE 0.6 12/22/2016   BUN 5.5 (L) 12/22/2016   CO2 24 12/22/2016   TSH 1.92 10/10/2016   INR 1.6 (H)  02/06/2017   HGBA1C 5.4 10/10/2016   MICROALBUR 3.7 (H) 10/26/2015    US Soft Tissue Head/neck  Result Date: 02/13/2017 CLINICAL DATA:  62 year old female with a history of neck pain and lymphadenopathy. Port catheter on prior chest x-ray 10/17/2016 EXAM: ULTRASOUND OF HEAD/NECK SOFT TISSUES TECHNIQUE: Ultrasound examination of the head and neck soft tissues was performed in the area of clinical concern. COMPARISON:  Chest x-ray 10/17/2016 FINDINGS: Grayscale and color duplex performed in the region of clinical concern. Adjacent to the plastic catheter of the port catheter, there is no focal fluid collection. There is an ill-defined isoechoic nodule within the soft tissues measured, with the greatest diameter 1.5 cm. This does not have the typical characteristics of a lymph node. IMPRESSION: Ultrasound survey in the region of clinical concern at the left neck adjacent to subcutaneous port catheter demonstrates ill-defined soft tissue nodule designated 1.5 cm in diameter. Findings are nonspecific, potentially old hematoma, granuloma, atypical lipoma, or less likely a lymph node. If there is concern for recurrent or progressive malignancy, recommend referral for oncologic evaluation. Electronically Signed   By: Corrie Mckusick D.O.   On: 02/13/2017 09:05     Assessment & Plan:  Plan  I am having [name] maintain her famotidine, acetaminophen, metroNIDAZOLE, glucose blood, NONFORMULARY OR COMPOUNDED ITEM, lidocaine-prilocaine, letrozole, ketoconazole, escitalopram, metFORMIN, buPROPion, pravastatin, nadolol, propranolol, levothyroxine, potassium chloride, FREESTYLE LIBRE READER, OLANZapine, omeprazole, ONETOUCH DELICA LANCETS 93Z, ALPRAZolam, glucose blood, furosemide, and sulfamethoxazole-trimethoprim.  No orders of the defined types were placed in this encounter.   Problem List Items Addressed This Visit      Unprioritized   DM (diabetes mellitus), type 2, uncontrolled, with renal complications  ([place])    [id number] to be checked, minimize simple carbs. Increase exercise as tolerated. Continue current meds       Relevant Orders   Microalbumin / creatinine urine ratio   Hemoglobin A1c   Essential hypertension    Well controlled, no changes to meds. Encouraged heart healthy diet such as the DASH diet and exercise as tolerated.       Relevant Orders   Microalbumin / creatinine urine ratio  Hyperlipidemia LDL goal <70    Tolerating statin, encouraged heart healthy diet, avoid trans fats, minimize simple carbs and saturated fats. Increase exercise as tolerated      Relevant Orders   Lipid panel   Comprehensive metabolic panel   Hypothyroidism    Check labs      Relevant Orders   TSH    Other Visit Diagnoses    Need for immunization against influenza       Relevant Orders   Flu Vaccine QUAD 36+ mos IM (Completed)      Follow-up: Return in about 6 months (around 08/21/2017).  Ann Held, DO

## 2017-02-20 NOTE — Telephone Encounter (Signed)
No 10/11 los. °

## 2017-02-20 NOTE — Telephone Encounter (Signed)
Refill request for Zofran sent electronically to [facility] service

## 2017-02-20 NOTE — Assessment & Plan Note (Signed)
Tolerating statin, encouraged heart healthy diet, avoid trans fats, minimize simple carbs and saturated fats. Increase exercise as tolerated 

## 2017-02-20 NOTE — Assessment & Plan Note (Signed)
hgba1c to be checked, minimize simple carbs. Increase exercise as tolerated. Continue current meds  

## 2017-02-20 NOTE — Assessment & Plan Note (Signed)
Check labs 

## 2017-03-02 ENCOUNTER — Ambulatory Visit (HOSPITAL_COMMUNITY)
Admission: RE | Admit: 2017-03-02 | Discharge: 2017-03-02 | Disposition: A | Discharge status: Home / Self Care | Payer: Medicare Other | Source: Ambulatory Visit | Attending: Adult Health | Admitting: Adult Health

## 2017-03-02 ENCOUNTER — Encounter (HOSPITAL_COMMUNITY): Payer: Self-pay

## 2017-03-02 DIAGNOSIS — R590 Localized enlarged lymph nodes: Secondary | ICD-10-CM | POA: Diagnosis not present

## 2017-03-02 DIAGNOSIS — I700 Atherosclerosis of aorta: Secondary | ICD-10-CM | POA: Insufficient documentation

## 2017-03-02 DIAGNOSIS — R918 Other nonspecific abnormal finding of lung field: Secondary | ICD-10-CM | POA: Diagnosis not present

## 2017-03-02 DIAGNOSIS — Z170 Estrogen receptor positive status [ER+]: Secondary | ICD-10-CM | POA: Diagnosis not present

## 2017-03-02 DIAGNOSIS — R911 Solitary pulmonary nodule: Secondary | ICD-10-CM | POA: Diagnosis not present

## 2017-03-02 DIAGNOSIS — C50811 Malignant neoplasm of overlapping sites of right female breast: Secondary | ICD-10-CM | POA: Diagnosis not present

## 2017-03-02 DIAGNOSIS — K7460 Unspecified cirrhosis of liver: Secondary | ICD-10-CM | POA: Diagnosis not present

## 2017-03-02 DIAGNOSIS — J90 Pleural effusion, not elsewhere classified: Secondary | ICD-10-CM | POA: Diagnosis not present

## 2017-03-02 DIAGNOSIS — Z9689 Presence of other specified functional implants: Secondary | ICD-10-CM | POA: Diagnosis not present

## 2017-03-02 DIAGNOSIS — R221 Localized swelling, mass and lump, neck: Secondary | ICD-10-CM | POA: Diagnosis present

## 2017-03-02 DIAGNOSIS — C562 Malignant neoplasm of left ovary: Secondary | ICD-10-CM | POA: Insufficient documentation

## 2017-03-02 DIAGNOSIS — K766 Portal hypertension: Secondary | ICD-10-CM | POA: Insufficient documentation

## 2017-03-02 MED ORDER — IOPAMIDOL (ISOVUE-300) INJECTION 61%
100.0000 mL | Freq: Once | INTRAVENOUS | Status: AC | PRN
  Administered 2017-03-02: 100 mL via INTRAVENOUS

## 2017-03-02 MED ORDER — IOPAMIDOL (ISOVUE-300) INJECTION 61%
INTRAVENOUS | Status: AC
  Filled 2017-03-02: qty 100

## 2017-03-02 MED ORDER — HEPARIN SOD (PORK) LOCK FLUSH 100 UNIT/ML IV SOLN
500.0000 [IU] | Freq: Once | INTRAVENOUS | Status: AC
  Administered 2017-03-02: 500 [IU] via INTRAVENOUS

## 2017-03-02 MED ORDER — HEPARIN SOD (PORK) LOCK FLUSH 100 UNIT/ML IV SOLN
INTRAVENOUS | Status: AC
  Filled 2017-03-02: qty 5

## 2017-03-03 NOTE — Progress Notes (Signed)
[place]  [phone] [phone]    ID: [name]   DOB: October 25, 1955  MR#: [id number]  ZHY#:[id number]  PCP: Ann Held, DO GYN:  SU: [facility] OTHER EX:BMWUXL [name], [name], [name]  CHIEF COMPLAINT:  Estrogen receptor positive Breast Cancer; Ovarian cancer; DVT  CURRENT TREATMENT: Letrozole  INTERVAL HISTORY: [name] returns today for a follow-up accompanied by her husband. She continues on letrozole, which she tolerates well. She denies hot flashes or vaginal dryness.   REVIEW OF SYSTEMS: [name] is doing well, however, reports that walking is still a problem. She presents today in a wheel chair. She has an upcoming appointment to decide if she is a candidate for surgery. She uses a walker at home without difficulty. Pt has also lost some weight. She denies unusual headaches, visual changes, nausea, vomiting, or dizziness. There has been no unusual cough, phlegm production, or pleurisy. This been no change in bowel or bladder habits. She denies unexplained fatigue or unexplained weight loss, bleeding, rash, or fever. A detailed review of systems was otherwise entirely stable.   BREAST CANCER HISTORY: From the original consult note:  [name] noted a mass in her right breast mid December 2013, and as it did not spontaneously resolve over a couple of weeks she brought it to her primary physician's attention. She was set up for diagnostic mammography and right breast ultrasonography at the breast Center 05/10/2012. (Note that the patient's most recent prior mammography had been in October 2008). The current study showed a spiculated mass in the superior subareolar portion of the right breast measuring approximately 5 cm and associated with pleomorphic calcifications. This was firm and palpable. There was right nipple retraction and skin thickening. Ultrasound confirmed an irregularly marginated hypoechoic  mass measuring 3.5 cm by ultrasound, and an abnormal appearing lower right axillary lymph node measuring 2.6 cm.  Biopsies of both the breast mass and the abnormal appearing lymph node were performed 05/21/2012. Both showed an invasive ductal carcinoma, grade 2, with similar prognostic panels (the breast mass was 100% estrogen and 73% progesterone receptor positive, with an MIB-1 of 5%; the lymph node was 100% estrogen 100% progesterone receptor positive, with an MIB-1 of 20%). Both masses were HER-2 negative.  Breast MRI obtained at [facility] imaging 05/29/2012 confirmed a dominant mass in the retroareolar right breast measuring 4.4 cm maximally. There was a satellite nodule inferior and lateral to this mass, measuring 1.7 cm. There were no other masses in either breast. Aside from the previously biopsied lymph node there were other mildly enhancing level I right axillary lymph nodes which did not appear pathologic. There was no other lymphadenopathy noted.   The patient's subsequent history is as detailed below.  OVARIAN CANCER HISTORY: From the 06/16/2014 summary note:  "[name] returns today for review of her restaging studies accompanied by her husband [name]. To summarize her recent history: She was admitted to [facility] with a diagnosis of possible pneumonia in late December 2015. Chest x-ray showed some increased density in the right upper lobe. CT scan was suggested and was obtained by Dr. Inda Castle. This showed 2 small nodules in the right middle lobe, measuring 8 and 4 mm respectively. Dr. Inda Castle then contacted [name] for further evaluation and we set [name] up for a PET scan and which was performed late January. This showed the 2 nodules in the lung not to be hypermetabolic. This of course does not prove that  they are not malignant. More importantly, there was a 13.7 cm cystic/solid mass in the upper pelvis associated with adenopathy,. All of this was hypermetabolic."  The  patient was evaluated by gynecologic oncology and on 06/27/2014 she underwent surgery under Dr. Denman George. This consisted of an exploratory laparotomy with hysterectomy and abdominal salpingo-oophorectomy, as well as tumor debulking and omentectomy. 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 The tumor measured 11.5 cm, focally involve the ovarian capsule on the left; the uterus and right ovary and fallopian tube were unremarkable. One para-aortic lymph node was positive out of a total of 6 lymph nodes sampled (2 left common iliac, 2 left para-aortic, and to within the omental resection).  Her subsequent history is as detailed below   PAST MEDICAL HISTORY: Past Medical History:  Diagnosis Date  . Allergy   . Anemia    low iron hx  . Arthritis   . Breast cancer ([place]) 2014   a. 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 Cataract    removed ou  . Chronic combined systolic and diastolic CHF (congestive heart failure) ([place])    a. 08/2012 Echo: EF 55-60%, no rwma, mod MR;  b. 07/2014 Echo: EF 45-50%, distal antsept HK, mod TR/MR, mildly bil-atrial enlargement.  . Clear Cell Ovarian Cancer    a. 06/2014 s/p TAH/BSO debulking/lymph node dissection;  b. Now on chemo.  . Depression   . Diabetes mellitus   . DJD (degenerative joint disease) of lumbar spine   . DVT (deep venous thrombosis) (H[facility])    hx of on HRT left leg ~2006  . Gallstones   . GERD (gastroesophageal reflux disease)   . Hyperlipidemia   . Hypertension   . Hypothyroidism   . Liver disease, chronic, with cirrhosis ([place])    ? autoimmune  . OSA on CPAP    cpap 4.5 setting  . Paroxysmal atrial fibrillation (HCC)    a. CHA2DS2VASc = 4-->coumadin.  [name] Physical deconditioning   . PPD positive, treated    rx inh   . Sleep apnea     PAST SURGICAL  HISTORY: Past Surgical History:  Procedure Laterality Date  . ABDOMINAL HYSTERECTOMY N/A 06/27/2014   Procedure: HYSTERECTOMY ABDOMINAL TOTAL;  Surgeon: Everitt Amber, MD;  Location: [facility];  Service: Gynecology;  Laterality: N/A;  . BREAST BIOPSY     left breast  . BREAST EXCISIONAL BIOPSY Left 2002  . CATARACT EXTRACTION    . CHOLECYSTECTOMY    . EYE SURGERY    . FOOT SURGERY    . history of chemotherapy x 2 treatments, radiation tx  2014  . LAPAROTOMY N/A 06/27/2014   Procedure: EXPLORATORY LAPAROTOMY;  Surgeon: Everitt Amber, MD;  Location: [facility];  Service: Gynecology;  Laterality: N/A;  . MASTECTOMY Right 2014  . MASTECTOMY MODIFIED RADICAL  06/08/2012   Procedure: MASTECTOMY MODIFIED RADICAL;  Surgeon: Edward Jolly, MD;  Location: [place];  Service: General;  Laterality: Right;  . pac removed  2015  . PEG TUBE PLACEMENT     peg removed 2015  . PERCUTANEOUS LIVER BIOPSY    . PORTACATH PLACEMENT  06/08/2012   Procedure: INSERTION PORT-A-CATH;  Surgeon: Edward Jolly, MD;  Location: [place];  Service: General;  Laterality: Left;  . SALPINGOOPHORECTOMY Bilateral 06/27/2014   Procedure: BILATERAL SALPINGO OOPHORECTOMY/TUMOR DEBULKING/LYMPHNODE DISSECTION, OMENTECTOMY;  Surgeon: Everitt Amber, MD;  Location: [facility];  Service: Gynecology;  Laterality: Bilateral;  . TUBAL LIGATION      FAMILY HISTORY Family History  Problem Relation Age of Onset  . Diabetes Mother   . Hypertension Mother   . Arthritis Mother   . Heart disease Mother   . Heart failure Mother   . Other Mother        benign breast mass  . Stroke Father   . Heart disease Father   . Dementia Father   . Diabetes Paternal Grandmother   . Colon cancer Paternal Grandfather        dx. 70s-80s  . Colon cancer Maternal Grandfather   . Other Daughter        potentially has had negative BRCA testing  . Other Daughter        hysterectomy; potentially has had negative genetic testing  . Rectal cancer Neg Hx   .  Esophageal cancer Neg Hx   . Liver cancer Neg Hx    the patient's father died in his 11s with a history of dementia. He had had prior strokes. The patient's mother died in her 78s, with a history of congestive heart failure. Victorious had no brothers, one sister. There is no history of breast or ovarian cancer in the family.  GYNECOLOGIC HISTORY: Menarche age 48, first live birth age 36, she is GX P2, menopause approximately 15 years ago, on hormone replacement until 2010.  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 Her husband [name] works for [employer]. Daughter [name] is a Physiological scientist and lives in [place]. Daughter [name] and her family (husband and 2 children aged 54 and 3 years) currently live with the patient. [employer] is a member of a [employer].   ADVANCED DIRECTIVES: Not in place  HEALTH MAINTENANCE: (Updated October 2014) Social History  Substance Use Topics  . Smoking status: Former Smoker    Packs/day: 1.00    Years: 1.00  . Smokeless tobacco: Former Systems developer  . Alcohol use No     Colonoscopy: Never  PAP: Does not recall  Bone density: Never  Lipid panel:   Allergies  Allergen Reactions  . Olmesartan Medoxomil Cough  . Adhesive [Tape] Rash  . Tetracycline Hcl Other (See Comments)  . Venlafaxine Other (See Comments)    severe dry mouth    Current Outpatient Prescriptions  Medication Sig Dispense Refill  . acetaminophen (TYLENOL) 500 MG tablet Take 500 mg by mouth every 6 (six) hours as needed for moderate pain.    [name] ALPRAZolam (XANAX) 0.5 MG tablet Take 1-2 tablets (0.5-1 mg total) by mouth 2 (two) times daily as needed for anxiety. 180 tablet 0  . buPROPion (WELLBUTRIN XL) 150 MG 24 hr tablet TAKE 1 TABLET BY MOUTH  DAILY 90 tablet 1  . Continuous Blood Gluc Receiver (FREESTYLE LIBRE READER) DEVI Inject 1 Stick as directed daily.  Use daily to check blood sugar. E11.9 1 Device 0  . escitalopram (LEXAPRO) 20 MG tablet TAKE 1 TABLET BY MOUTH  EVERY MORNING 90 tablet 1  . famotidine (PEPCID) 20 MG tablet Take 1 tablet (20 mg total) by mouth daily. 30 tablet 0  . furosemide (LASIX) 20 MG tablet TAKE 1 TABLET BY MOUTH  DAILY 90 tablet 0  . glucose blood (BAYER CONTOUR NEXT TEST) test strip Use as instructed to check pt's blood sugar  twice a day.  DX E11.9 100 each 5  . glucose blood (ONETOUCH VERIO) test strip USE TO CHECK BLOOD SUGAR TWICE DAILY 100 each 1  . ketoconazole (NIZORAL) 2 % cream Apply 1 application topically daily. 15 g 3  . letrozole (FEMARA) 2.5 MG tablet Take 1 tablet (2.5 mg total) by mouth daily. 90 tablet 4  . levothyroxine (SYNTHROID, LEVOTHROID) 150 MCG tablet TAKE 1 TABLET BY MOUTH  DAILY 90 tablet 0  . lidocaine-prilocaine (EMLA) cream Apply over port area 1-2 hours before chemotherapy 30 g 0  . metFORMIN (GLUCOPHAGE-XR) 500 MG 24 hr tablet TAKE 2 TABLETS BY MOUTH  EVERY EVENING (Patient taking differently: one in the am and one in the pm) 180 tablet 1  . metroNIDAZOLE (METROGEL) 1 % gel Apply topically daily. 45 g 0  . nadolol (CORGARD) 40 MG tablet Take 1 tablet (40 mg total) by mouth daily. 90 tablet 3  . NONFORMULARY OR COMPOUNDED ITEM Cmp, lipid, hgba1c, tsh--- dx , diabetes II, htn, hyperlipidemia, hypothyroidism 1 each 0  . OLANZapine (ZYPREXA) 2.5 MG tablet TAKE 1 TABLET NIGHTLY AT  BEDTIME 90 tablet 0  . omeprazole (PRILOSEC) 20 MG capsule TAKE 1 CAPSULE BY MOUTH  DAILY 90 capsule 0  . ondansetron (ZOFRAN) 8 MG tablet Take 1 tablet (8 mg total) by mouth every 8 (eight) hours as needed for nausea or vomiting. 270 tablet 0  . ONETOUCH DELICA LANCETS 17E MISC USE TO CHECK BLOOD SUGAR TWICE DAILY 100 each 0  . potassium chloride (K-DUR,KLOR-CON) 10 MEQ tablet TAKE 1 TABLET BY MOUTH TWO  TIMES DAILY 180 tablet 0  . pravastatin (PRAVACHOL) 20 MG tablet TAKE 1 TABLET BY MOUTH AT  BEDTIME 90 tablet 1  .  propranolol (INDERAL) 20 MG tablet Take 1 tablet (20 mg total) by mouth 2 (two) times daily. 180 tablet 3  . sulfamethoxazole-trimethoprim (BACTRIM DS) 800-160 MG tablet Take 1 tablet by mouth 2 (two) times daily. 14 tablet 0   No current facility-administered medications for this visit.     OBJECTIVE: Middle-aged white woman examined in a wheelchair  Vitals:   03/10/17 1007  BP: (!) 131/55  Pulse: 83  Resp: 17  Temp: 97.8 F (36.6 C)  SpO2: 98%     Body mass index is 33.09 kg/m.    ECOG FS: 2 Filed Weights   03/10/17 1007  Weight: 192 lb 12.8 oz (87.5 kg)   Sclerae unicteric, pupils round and equal Oropharynx clear and moist No cervical or supraclavicular adenopathy Lungs no rales or rhonchi Heart regular rate and rhythm Abd soft, nontender, positive bowel sounds MSK no focal spinal tenderness, no upper extremity lymphedema Neuro: nonfocal, well oriented, appropriate affect Breasts: The right breast is status post mastectomy.  There is no evidence of local recurrence.  The left breast is unremarkable.  Both axillae are benign and specifically I do not palpate a mass in the left axilla.   Lab Results  Component Value Date   WBC 4.6 03/10/2017   NEUTROABS 3.1 03/10/2017   HGB 12.6 03/10/2017   HCT 38.5 03/10/2017   MCV 98.0 03/10/2017   PLT 128 (L) 03/10/2017     Chemistry      Component Value Date/Time   NA 142 02/20/2017 1020   NA 141 12/22/2016 1144   K 3.7 02/20/2017 1020   K 3.5 12/22/2016 1144   CL 108 02/20/2017 1020   CL 104 10/29/2012 1058   CO2 23 02/20/2017 1020   CO2 24  12/22/2016 1144   BUN 6 02/20/2017 1020   BUN 5.5 (L) 12/22/2016 1144   CREATININE 0.46 02/20/2017 1020   CREATININE 0.6 12/22/2016 1144      Component Value Date/Time   CALCIUM 7.7 (L) 02/20/2017 1020   CALCIUM 8.8 12/22/2016 1144   ALKPHOS 143 (H) 02/20/2017 1020   ALKPHOS 158 (H) 12/22/2016 1144   AST 60 (H) 02/20/2017 1020   AST 83 (H) 12/22/2016 1144   ALT 25 02/20/2017  1020   ALT 34 12/22/2016 1144   BILITOT 1.6 (H) 02/20/2017 1020   BILITOT 1.95 (H) 12/22/2016 1144      STUDIES: Ct Soft Tissue Neck W Contrast  Result Date: 03/02/2017 CLINICAL DATA:  Localized swelling, mass or lump of neck. Personal history of left ovarian epithelial cancer. Personal history of malignant neoplasm of the overlapping sites of right breast in female, ER positive. Palpable lump above the left Port-A-Cath. EXAM: CT NECK WITH CONTRAST TECHNIQUE: Multidetector CT imaging of the neck was performed using the standard protocol following the bolus administration of intravenous contrast. CONTRAST:  17m ISOVUE-300 IOPAMIDOL (ISOVUE-300) INJECTION 61% COMPARISON:  PET scan 07/27/2016 FINDINGS: Pharynx and larynx: No focal mucosal or submucosal lesions are present. The nasopharynx, oropharynx, and hypopharynx are clear. The epiglottis is within normal limits. The tongue base is unremarkable. Vocal cords are midline and symmetric. The visualized trachea is within normal limits. Salivary glands: The submandibular and parotid glands are within normal limits bilaterally. Thyroid: The thyroid gland is atrophic without focal lesion. Lymph nodes: A left level 3 lymph node has increased in size since the prior studies. The node measures 13 x 9 x 10 mm. A left subclavian node along the chest wall is slightly larger than on the prior study, measuring 7 mm in short axis on image 97 of series 7. A left level 2 lymph node is slightly rounded, but not pathologically enlarged. No significant right-sided adenopathy is present. Vascular: Atherosclerotic calcifications are again noted at carotid bifurcations without focal stenosis. Limited intracranial: Within normal limits. Visualized orbits: Bilateral lens replacements are present. The globes and orbits are otherwise within normal limits. Mastoids and visualized paranasal sinuses: The paranasal sinuses and mastoid air cells are clear. Skeleton: Endplate  degenerative changes are noted in the cervical spine. No focal lytic or blastic lesions are present. AP alignment is anatomic. Upper chest: Post radiation changes are noted at the right apex. No focal nodule or mass lesion is present. Please see dedicated CT of the chest from the same day. Other: There is focal skin thickening just above the Port-A-Cath. This could be inflammatory or related to scar tissue. Malignancy is considered less likely. IMPRESSION: 1. Enlarging left level 3 lymph node raises concern for metastatic disease. Consider follow-up PET scan to assess metabolic activity of this node and other metastatic disease. 2. Slight increase in size of subcentimeter lymph node adjacent to the left chest wall. 3. Focal skin thickening just above the Port-A-Cath as described above. Electronically Signed   By: CSan MorelleM.D.   On: 03/02/2017 08:48   Ct Chest W Contrast  Result Date: 03/02/2017 CLINICAL DATA:  History of breast cancer and ovarian cancer. Patient feels a mass above Port-A-Cath EXAM: CT CHEST WITH CONTRAST TECHNIQUE: Multidetector CT imaging of the chest was performed during intravenous contrast administration. CONTRAST:  1028mISOVUE-300 IOPAMIDOL (ISOVUE-300) INJECTION 61% COMPARISON:  PET-CT from 07/24/2016 FINDINGS: Cardiovascular: The scratch set mild cardiac enlargement. Aortic atherosclerosis. Calcification in the RCA and LAD coronary artery noted.  Mediastinum/Nodes: Esophageal varices are identified, image 48 of series 3. The trachea appears patent and is midline. Left-sided pre-vascular lymph node Measures 1 cm, image 22 of series 3. Previously 0.9 cm. Right paratracheal lymph node measures 1 cm, image 22 of series 3. Unchanged from previous exam. Sub- carinal lymph node measures 1.4 cm, image 33 of series 3. Stable from previous exam. No axillary adenopathy. Previous right axillary nodal dissection. Lungs/Pleura: There is a small left pleural effusion noted. Biapical  pleuroparenchymal scarring identified similar to previous exam. There is mild interstitial thickening identified which appears most prominent within the lower lung zones. Pulmonary nodule in the right middle lobe measures 5 mm, image 92 of series 6. Previously this measured the same. Upper Abdomen: The liver appears cirrhotic. There are esophageal varices and splenomegaly as before. Ascites noted. Prominent upper abdominal lymph nodes are nonspecific in the setting of cirrhosis. Musculoskeletal: There is no aggressive lytic or sclerotic bone lesions. Mild spondylosis within the thoracic spine. Previous right mastectomy. Left chest wall port a catheter is identified. Small subcutaneous nodule is identified just above the porta catheter site measuring 8 mm. This is of uncertain clinical significance IMPRESSION: 1. Small left pleural effusion and interstitial thickening identified compatible with mild CHF. 2. Similar appearance of borderline enlarged mediastinal lymph nodes. 3. Cirrhosis with stigmata of portal venous hypertension including splenomegaly and esophageal varices and abdominal ascites. 4. Small cutaneous soft tissue nodule overlying the left chest wall port a catheter is of on certain clinical significance. Correlation with direct visualization identified. 5.  Aortic Atherosclerosis (ICD10-I70.0). 6. Stable 5 mm right middle lobe pulmonary nodule. Electronically Signed   By: Kerby Moors M.D.   On: 03/02/2017 09:04   US Soft Tissue Head/neck  Result Date: 02/13/2017 CLINICAL DATA:  61 year old female with a history of neck pain and lymphadenopathy. Port catheter on prior chest x-ray 10/17/2016 EXAM: ULTRASOUND OF HEAD/NECK SOFT TISSUES TECHNIQUE: Ultrasound examination of the head and neck soft tissues was performed in the area of clinical concern. COMPARISON:  Chest x-ray 10/17/2016 FINDINGS: Grayscale and color duplex performed in the region of clinical concern. Adjacent to the plastic catheter of  the port catheter, there is no focal fluid collection. There is an ill-defined isoechoic nodule within the soft tissues measured, with the greatest diameter 1.5 cm. This does not have the typical characteristics of a lymph node. IMPRESSION: Ultrasound survey in the region of clinical concern at the left neck adjacent to subcutaneous port catheter demonstrates ill-defined soft tissue nodule designated 1.5 cm in diameter. Findings are nonspecific, potentially old hematoma, granuloma, atypical lipoma, or less likely a lymph node. If there is concern for recurrent or progressive malignancy, recommend referral for oncologic evaluation. Electronically Signed   By: Corrie Mckusick D.O.   On: 02/13/2017 09:05   ASSESSMENT: 61 y.o.  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    (2)  treated in the adjuvant setting with docetaxel/ cyclophosphamide given every 3 weeks. There were multiple and severe complications, and the  patient tolerated only two cycles, last dose 07/29/2012  (3) letrozole started 08/16/2012  (a) osteopenia, with a T score of -1.14 April 2013  (4) postmastectomy radiation completed 12/24/2012  (5)  comorbidities include diabetes, hypertension, chronic liver disease with cirrhosis and fatty liver, and nutritional  disturbance with temporarily dependence on PEG feeds (discontinued July 2014).  (6) restaging studies January 2016 show  (a) 8 mm and 4 mm Right middle lobe lung nodules, not metabolically active on PET -- will require follow-up  (b) hypermetabolic 75.4 cm mixed solid/cystic lesion in upper pelvis, with associated adenopathy  OVARIAN CANCER (7) status post exploratory laparotomy 06/27/2014 with total abdominal hysterectomy, bilateral salpingo-oophorectomy,  para-aortic lymphadenectomy, omentectomy and radical tumor debulking for a left-sided clear cell ovarian cancer, pT1c pN1, stage IIIC .  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 despite being on Coumadin for PAF (subtherapeutic INR) with mobile Right common femoral clot  (a) status post thrombolysis 09/30/2014  (b) on Lovenox daily as of 10/02/2014, stopped April 2017  (c) IVC filter placed 10/26/2014 as mobile clot still noted Right groin  (d) IVC filter removed 01/30/2015 with documented resolution of the movable thrombus 01/16/2015  (e) normal quantitative D-dimer 08/06/2015 and 09/24/2015  (10) started abraxane 10/13/2014, repeated every 14 days for 6 cycles (12 doses), completed 03/26/2015  (11) genetics testing December 04, 2014 through the Breast/Ovarian Cancer Panel [facility] no deleterious mutations in ATM, BARD1, BRCA1, BRCA2, BRIP1, CDH1, CHEK2, FANCC, MLH1, MSH2, MSH6, NBN, PALB2, PMS2, PTEN, RAD51C, RAD51D, STK11, TP53, and XRCC2.    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  She does not belong to a gym and so does not have access to a swimming pool where she could do water aerobics perhaps.  She will be seeing her orthopedist Dr. Wynelle Link mid November and hopefully he will be able to help her get out of that particular hole.  She has  lost some weight, likely due to the inactivity.  From a cancer point of view the CT scan is very favorable except for the findings in the left axilla.  It would be very unusual to have metastases from the right breast cancer go to the left axilla with no intermediate metastases.  It is possible to have metastases to the left supraclavicular area from ovarian cancer however.    We are going to start with a PET scan and if anything lights up in the left axilla we will do a biopsy.  I will call her with those results.  Otherwise she will see me again in 2 months.  She knows to call for any other issues that may develop before that visit.  Vester Titsworth, Virgie Dad, MD  03/10/17 10:23 AM Medical Oncology and Hematology [facility] [id number] [address] [place], [place] [address] Tel. [phone]    Fax. [phone]  This document serves as a record of services personally performed by Chauncey Cruel, MD. It was created on his behalf by Margit Banda, a trained medical scribe. The creation of this record is based on the scribe's personal observations and the provider's statements to them. This document has been checked and approved by the attending provider.

## 2017-03-05 ENCOUNTER — Other Ambulatory Visit: Payer: Self-pay | Admitting: *Deleted

## 2017-03-05 DIAGNOSIS — C562 Malignant neoplasm of left ovary: Secondary | ICD-10-CM

## 2017-03-05 DIAGNOSIS — C50811 Malignant neoplasm of overlapping sites of right female breast: Secondary | ICD-10-CM

## 2017-03-05 DIAGNOSIS — Z170 Estrogen receptor positive status [ER+]: Secondary | ICD-10-CM

## 2017-03-05 MED ORDER — ONDANSETRON HCL 8 MG PO TABS: 8.0000 mg | ORAL_TABLET | Freq: Three times a day (TID) | ORAL | 0 refills | Status: DC | PRN

## 2017-03-05 NOTE — Telephone Encounter (Signed)
Per call from pt's husband, [name]- requesting prescription for ondansetron to be made for 90 days " because a 30 day supply cost $8 but a 90 day has no co pay "  Refill sent per above.

## 2017-03-10 ENCOUNTER — Other Ambulatory Visit (HOSPITAL_BASED_OUTPATIENT_CLINIC_OR_DEPARTMENT_OTHER): Payer: Medicare Other

## 2017-03-10 ENCOUNTER — Ambulatory Visit (HOSPITAL_BASED_OUTPATIENT_CLINIC_OR_DEPARTMENT_OTHER): Payer: Medicare Other

## 2017-03-10 ENCOUNTER — Ambulatory Visit (HOSPITAL_BASED_OUTPATIENT_CLINIC_OR_DEPARTMENT_OTHER): Payer: Medicare Other | Admitting: Oncology

## 2017-03-10 ENCOUNTER — Telehealth: Payer: Self-pay | Admitting: Oncology

## 2017-03-10 VITALS — BP 131/55 | HR 83 | Temp 97.8°F | Resp 17 | Ht 64.0 in | Wt 192.8 lb

## 2017-03-10 DIAGNOSIS — C562 Malignant neoplasm of left ovary: Secondary | ICD-10-CM

## 2017-03-10 DIAGNOSIS — C50811 Malignant neoplasm of overlapping sites of right female breast: Secondary | ICD-10-CM

## 2017-03-10 DIAGNOSIS — C772 Secondary and unspecified malignant neoplasm of intra-abdominal lymph nodes: Secondary | ICD-10-CM | POA: Diagnosis not present

## 2017-03-10 DIAGNOSIS — D6959 Other secondary thrombocytopenia: Secondary | ICD-10-CM | POA: Diagnosis not present

## 2017-03-10 DIAGNOSIS — Z170 Estrogen receptor positive status [ER+]: Secondary | ICD-10-CM

## 2017-03-10 DIAGNOSIS — Z79811 Long term (current) use of aromatase inhibitors: Secondary | ICD-10-CM

## 2017-03-10 DIAGNOSIS — G620 Drug-induced polyneuropathy: Secondary | ICD-10-CM

## 2017-03-10 DIAGNOSIS — C569 Malignant neoplasm of unspecified ovary: Secondary | ICD-10-CM

## 2017-03-10 DIAGNOSIS — Z86718 Personal history of other venous thrombosis and embolism: Secondary | ICD-10-CM | POA: Diagnosis not present

## 2017-03-10 DIAGNOSIS — Z95828 Presence of other vascular implants and grafts: Secondary | ICD-10-CM

## 2017-03-10 LAB — CBC WITH DIFFERENTIAL/PLATELET
BASO%: 0.4 % (ref 0.0–2.0)
Basophils Absolute: 0 10*3/uL (ref 0.0–0.1)
EOS ABS: 0.1 10*3/uL (ref 0.0–0.5)
EOS%: 1.3 % (ref 0.0–7.0)
HEMATOCRIT: 38.5 % (ref 34.8–46.6)
HEMOGLOBIN: 12.6 g/dL (ref 11.6–15.9)
LYMPH#: 1 10*3/uL (ref 0.9–3.3)
LYMPH%: 21.7 % (ref 14.0–49.7)
MCH: 32.1 pg (ref 25.1–34.0)
MCHC: 32.7 g/dL (ref 31.5–36.0)
MCV: 98 fL (ref 79.5–101.0)
MONO#: 0.4 10*3/uL (ref 0.1–0.9)
MONO%: 9.4 % (ref 0.0–14.0)
NEUT%: 67.2 % (ref 38.4–76.8)
NEUTROS ABS: 3.1 10*3/uL (ref 1.5–6.5)
PLATELETS: 128 10*3/uL — AB (ref 145–400)
RBC: 3.93 10*6/uL (ref 3.70–5.45)
RDW: 16.6 % — AB (ref 11.2–14.5)
WBC: 4.6 10*3/uL (ref 3.9–10.3)

## 2017-03-10 LAB — COMPREHENSIVE METABOLIC PANEL
ALBUMIN: 2.8 g/dL — AB (ref 3.5–5.0)
ALK PHOS: 156 U/L — AB (ref 40–150)
ALT: 30 U/L (ref 0–55)
AST: 76 U/L — ABNORMAL HIGH (ref 5–34)
Anion Gap: 10 mEq/L (ref 3–11)
BILIRUBIN TOTAL: 2.15 mg/dL — AB (ref 0.20–1.20)
BUN: 5.6 mg/dL — ABNORMAL LOW (ref 7.0–26.0)
CO2: 20 mEq/L — ABNORMAL LOW (ref 22–29)
Calcium: 8.8 mg/dL (ref 8.4–10.4)
Chloride: 110 mEq/L — ABNORMAL HIGH (ref 98–109)
Creatinine: 0.6 mg/dL (ref 0.6–1.1)
GLUCOSE: 89 mg/dL (ref 70–140)
Potassium: 3.8 mEq/L (ref 3.5–5.1)
SODIUM: 140 meq/L (ref 136–145)
TOTAL PROTEIN: 7.1 g/dL (ref 6.4–8.3)

## 2017-03-10 MED ORDER — HEPARIN SOD (PORK) LOCK FLUSH 100 UNIT/ML IV SOLN
500.0000 [IU] | Freq: Once | INTRAVENOUS | Status: AC
Start: 2017-03-10 — End: 2017-03-10
  Administered 2017-03-10: 500 [IU] via INTRAVENOUS
  Filled 2017-03-10: qty 5

## 2017-03-10 MED ORDER — SODIUM CHLORIDE 0.9% FLUSH
10.0000 mL | INTRAVENOUS | Status: DC | PRN
  Administered 2017-03-10: 10 mL via INTRAVENOUS
  Filled 2017-03-10: qty 10

## 2017-03-10 NOTE — Patient Instructions (Signed)

## 2017-03-10 NOTE — Telephone Encounter (Signed)
Gave patient AVS and calendar of upcoming December appointments.  °

## 2017-03-11 LAB — CA 125: Cancer Antigen (CA) 125: 153.1 U/mL — ABNORMAL HIGH (ref 0.0–38.1)

## 2017-03-18 ENCOUNTER — Other Ambulatory Visit: Payer: Self-pay

## 2017-03-18 ENCOUNTER — Other Ambulatory Visit: Payer: Self-pay | Admitting: *Deleted

## 2017-03-18 MED ORDER — ALPRAZOLAM 0.5 MG PO TABS: 0.5000 mg | ORAL_TABLET | Freq: Two times a day (BID) | ORAL | 0 refills | Status: DC | PRN

## 2017-03-21 ENCOUNTER — Ambulatory Visit (HOSPITAL_COMMUNITY)
Admission: RE | Admit: 2017-03-21 | Discharge: 2017-03-21 | Disposition: A | Discharge status: Home / Self Care | Payer: Medicare Other | Source: Ambulatory Visit | Attending: Oncology | Admitting: Oncology

## 2017-03-21 DIAGNOSIS — C50811 Malignant neoplasm of overlapping sites of right female breast: Secondary | ICD-10-CM | POA: Diagnosis not present

## 2017-03-21 DIAGNOSIS — R161 Splenomegaly, not elsewhere classified: Secondary | ICD-10-CM | POA: Diagnosis not present

## 2017-03-21 DIAGNOSIS — K7460 Unspecified cirrhosis of liver: Secondary | ICD-10-CM | POA: Diagnosis not present

## 2017-03-21 DIAGNOSIS — C772 Secondary and unspecified malignant neoplasm of intra-abdominal lymph nodes: Secondary | ICD-10-CM | POA: Diagnosis not present

## 2017-03-21 DIAGNOSIS — R590 Localized enlarged lymph nodes: Secondary | ICD-10-CM | POA: Insufficient documentation

## 2017-03-21 DIAGNOSIS — C562 Malignant neoplasm of left ovary: Secondary | ICD-10-CM | POA: Insufficient documentation

## 2017-03-21 DIAGNOSIS — R918 Other nonspecific abnormal finding of lung field: Secondary | ICD-10-CM | POA: Diagnosis not present

## 2017-03-21 DIAGNOSIS — Z170 Estrogen receptor positive status [ER+]: Secondary | ICD-10-CM | POA: Insufficient documentation

## 2017-03-21 LAB — GLUCOSE, CAPILLARY: GLUCOSE-CAPILLARY: 90 mg/dL (ref 65–99)

## 2017-03-21 MED ORDER — FLUDEOXYGLUCOSE F - 18 (FDG) INJECTION
7.9000 | Freq: Once | INTRAVENOUS | Status: AC | PRN
  Administered 2017-03-21: 7.9 via INTRAVENOUS

## 2017-04-07 ENCOUNTER — Other Ambulatory Visit: Payer: Self-pay

## 2017-04-09 ENCOUNTER — Other Ambulatory Visit: Payer: Self-pay | Admitting: Oncology

## 2017-04-09 ENCOUNTER — Other Ambulatory Visit: Payer: Self-pay | Admitting: Family Medicine

## 2017-04-09 DIAGNOSIS — IMO0002 Reserved for concepts with insufficient information to code with codable children: Secondary | ICD-10-CM

## 2017-04-09 DIAGNOSIS — I10 Essential (primary) hypertension: Secondary | ICD-10-CM

## 2017-04-09 DIAGNOSIS — E1151 Type 2 diabetes mellitus with diabetic peripheral angiopathy without gangrene: Secondary | ICD-10-CM

## 2017-04-09 DIAGNOSIS — K219 Gastro-esophageal reflux disease without esophagitis: Secondary | ICD-10-CM

## 2017-04-09 DIAGNOSIS — E1165 Type 2 diabetes mellitus with hyperglycemia: Principal | ICD-10-CM

## 2017-04-09 DIAGNOSIS — E785 Hyperlipidemia, unspecified: Secondary | ICD-10-CM

## 2017-04-13 ENCOUNTER — Encounter: Payer: Medicare Other | Admitting: Family Medicine

## 2017-04-13 ENCOUNTER — Telehealth: Payer: Self-pay

## 2017-04-13 NOTE — Telephone Encounter (Signed)
-----   Message from Roetta Sessions, [place] sent at 02/11/2017  4:05 PM EDT ----- Regarding: repeat labs due Pt needs to have repeat labs done beginning of December 2018: LFT and INR.  Labs are entered.  Call and remind pt to go downstairs to lab 7:30 to 5:00pm

## 2017-04-13 NOTE — Telephone Encounter (Signed)
Lm for pt to call back

## 2017-04-13 NOTE — Telephone Encounter (Signed)
Pt's husband called.  Pt has an appt near [name] on December 18th.  He indicated they will go to the lab then to have labs done. I let him know the days and hours lab is open. He expressed understanding.

## 2017-04-23 ENCOUNTER — Other Ambulatory Visit: Payer: Self-pay | Admitting: Family Medicine

## 2017-04-27 ENCOUNTER — Telehealth: Payer: Self-pay | Admitting: Gastroenterology

## 2017-04-27 NOTE — Telephone Encounter (Signed)
Patient husband called back stating that [facility] # [phone]

## 2017-04-27 NOTE — Telephone Encounter (Signed)
Tylenol up to a max of 2g daily is ok

## 2017-04-27 NOTE — Telephone Encounter (Signed)
Routed to DOD, Dr. Havery Moros patient, please advise.

## 2017-04-27 NOTE — Telephone Encounter (Signed)
Patient husband calling back just wanting advise on what to give pt for her headache that will not affect her liver.

## 2017-04-27 NOTE — Telephone Encounter (Signed)
Please advise, looks like [name] was going to remind her to come get her labs done mid December.

## 2017-04-28 ENCOUNTER — Other Ambulatory Visit (HOSPITAL_BASED_OUTPATIENT_CLINIC_OR_DEPARTMENT_OTHER): Payer: Medicare Other

## 2017-04-28 ENCOUNTER — Ambulatory Visit (HOSPITAL_BASED_OUTPATIENT_CLINIC_OR_DEPARTMENT_OTHER): Payer: Medicare Other

## 2017-04-28 ENCOUNTER — Ambulatory Visit (HOSPITAL_BASED_OUTPATIENT_CLINIC_OR_DEPARTMENT_OTHER): Payer: Medicare Other | Admitting: Oncology

## 2017-04-28 ENCOUNTER — Telehealth: Payer: Self-pay | Admitting: Oncology

## 2017-04-28 ENCOUNTER — Other Ambulatory Visit: Payer: Self-pay

## 2017-04-28 VITALS — BP 148/69 | HR 76 | Temp 97.7°F | Resp 16 | Ht 64.0 in | Wt 189.5 lb

## 2017-04-28 DIAGNOSIS — C562 Malignant neoplasm of left ovary: Secondary | ICD-10-CM

## 2017-04-28 DIAGNOSIS — Z170 Estrogen receptor positive status [ER+]: Secondary | ICD-10-CM

## 2017-04-28 DIAGNOSIS — D696 Thrombocytopenia, unspecified: Secondary | ICD-10-CM

## 2017-04-28 DIAGNOSIS — G620 Drug-induced polyneuropathy: Secondary | ICD-10-CM | POA: Diagnosis not present

## 2017-04-28 DIAGNOSIS — C50811 Malignant neoplasm of overlapping sites of right female breast: Secondary | ICD-10-CM

## 2017-04-28 DIAGNOSIS — C772 Secondary and unspecified malignant neoplasm of intra-abdominal lymph nodes: Secondary | ICD-10-CM

## 2017-04-28 DIAGNOSIS — Z79811 Long term (current) use of aromatase inhibitors: Secondary | ICD-10-CM | POA: Diagnosis not present

## 2017-04-28 DIAGNOSIS — C569 Malignant neoplasm of unspecified ovary: Secondary | ICD-10-CM

## 2017-04-28 DIAGNOSIS — Z95828 Presence of other vascular implants and grafts: Secondary | ICD-10-CM

## 2017-04-28 LAB — CBC WITH DIFFERENTIAL/PLATELET
BASO%: 0.5 % (ref 0.0–2.0)
Basophils Absolute: 0 10*3/uL (ref 0.0–0.1)
EOS%: 2.6 % (ref 0.0–7.0)
Eosinophils Absolute: 0.1 10*3/uL (ref 0.0–0.5)
HCT: 35 % (ref 34.8–46.6)
HGB: 11.6 g/dL (ref 11.6–15.9)
LYMPH%: 19.8 % (ref 14.0–49.7)
MCH: 32.9 pg (ref 25.1–34.0)
MCHC: 33 g/dL (ref 31.5–36.0)
MCV: 99.8 fL (ref 79.5–101.0)
MONO#: 0.2 10*3/uL (ref 0.1–0.9)
MONO%: 8.4 % (ref 0.0–14.0)
NEUT%: 68.7 % (ref 38.4–76.8)
NEUTROS ABS: 1.8 10*3/uL (ref 1.5–6.5)
Platelets: 91 10*3/uL — ABNORMAL LOW (ref 145–400)
RBC: 3.51 10*6/uL — AB (ref 3.70–5.45)
RDW: 16.9 % — ABNORMAL HIGH (ref 11.2–14.5)
WBC: 2.6 10*3/uL — AB (ref 3.9–10.3)
lymph#: 0.5 10*3/uL — ABNORMAL LOW (ref 0.9–3.3)

## 2017-04-28 LAB — COMPREHENSIVE METABOLIC PANEL
ALT: 21 U/L (ref 0–55)
AST: 53 U/L — AB (ref 5–34)
Albumin: 2.7 g/dL — ABNORMAL LOW (ref 3.5–5.0)
Alkaline Phosphatase: 143 U/L (ref 40–150)
Anion Gap: 9 mEq/L (ref 3–11)
BUN: 6.5 mg/dL — AB (ref 7.0–26.0)
CALCIUM: 8.4 mg/dL (ref 8.4–10.4)
CHLORIDE: 109 meq/L (ref 98–109)
CO2: 22 meq/L (ref 22–29)
Creatinine: 0.6 mg/dL (ref 0.6–1.1)
EGFR: >60 mL/min/{1.73_m2} (ref >60)
GLUCOSE: 121 mg/dL (ref 70–140)
POTASSIUM: 3.4 meq/L — AB (ref 3.5–5.1)
SODIUM: 140 meq/L (ref 136–145)
Total Bilirubin: 2.96 mg/dL — ABNORMAL HIGH (ref 0.20–1.20)
Total Protein: 6.3 g/dL — ABNORMAL LOW (ref 6.4–8.3)

## 2017-04-28 MED ORDER — HEPARIN SOD (PORK) LOCK FLUSH 100 UNIT/ML IV SOLN
500.0000 [IU] | Freq: Once | INTRAVENOUS | Status: AC | PRN
  Administered 2017-04-28: 500 [IU]
  Filled 2017-04-28: qty 5

## 2017-04-28 MED ORDER — SODIUM CHLORIDE 0.9 % IJ SOLN
10.0000 mL | INTRAMUSCULAR | Status: DC | PRN
  Administered 2017-04-28: 10 mL
  Filled 2017-04-28: qty 10

## 2017-04-28 NOTE — Telephone Encounter (Signed)
Patient's husband aware of Tylenol recommendations up to 2 grams per day. He also wanted to let Dr. Havery Moros know that they had the lab work done today while at the cancer center, unsure if results will be routed to Dr. Havery Moros, please be aware. Thanks.

## 2017-04-28 NOTE — Telephone Encounter (Signed)
Gave patient AVS and calendar of upcoming March 2019 appointments.  °

## 2017-04-28 NOTE — Progress Notes (Signed)
[name]  [phone] [phone]    ID: [name]   DOB: 1955/06/16  MR#: [id number]  KTG#:[id number]  PCP: Ann Held, DO GYN:  SU: [facility] OTHER SK:AJGOTL [name], Shanon Ace, Roney Jaffe  CHIEF COMPLAINT:  Estrogen receptor positive Breast Cancer; Ovarian cancer; DVT  CURRENT TREATMENT: Letrozole  INTERVAL HISTORY: [name] returns today for follow-up and treatment of her estrogen receptor positive breast cancer and for follow-up of her ovarian cancer, accompanied by her husband.  The interval history is generally unremarkable.  She continues on letrozole, with excellent tolerance.  She will be due for repeat bone density scan at her next mammogram which will be in January  REVIEW OF SYSTEMS: [name] tells me her knees are weak.  At home she uses her walker.  She goes all around the house all the time, she does not get back into bed during the day, but when she goes shopping she has to sit down after a while because of the knee problem.  She denies any fever rash bleeding or other systemic issues.  She is not aware of any adenopathy.  She tells me she swallows well feels hungry and food tastes normal.  She has normal bowel movements.  She has good urine control.  A detailed review of systems today was otherwise not  BREAST CANCER HISTORY: From the original consult note:  [name] noted a mass in her right breast mid December 2013, and as it did not spontaneously resolve over a couple of weeks she brought it to her primary physician's attention. She was set up for diagnostic mammography and right breast ultrasonography at the breast Center 05/10/2012. (Note that the patient's most recent prior mammography had been in October 2008). The current study showed a spiculated mass in the superior subareolar portion of the right breast measuring approximately 5 cm and associated with pleomorphic calcifications. This  was firm and palpable. There was right nipple retraction and skin thickening. Ultrasound confirmed an irregularly marginated hypoechoic mass measuring 3.5 cm by ultrasound, and an abnormal appearing lower right axillary lymph node measuring 2.6 cm.  Biopsies of both the breast mass and the abnormal appearing lymph node were performed 05/21/2012. Both showed an invasive ductal carcinoma, grade 2, with similar prognostic panels (the breast mass was 100% estrogen and 73% progesterone receptor positive, with an MIB-1 of 5%; the lymph node was 100% estrogen 100% progesterone receptor positive, with an MIB-1 of 20%). Both masses were HER-2 negative.  Breast MRI obtained at [facility] imaging 05/29/2012 confirmed a dominant mass in the retroareolar right breast measuring 4.4 cm maximally. There was a satellite nodule inferior and lateral to this mass, measuring 1.7 cm. There were no other masses in either breast. Aside from the previously biopsied lymph node there were other mildly enhancing level I right axillary lymph nodes which did not appear pathologic. There was no other lymphadenopathy noted.   The patient's subsequent history is as detailed below.  OVARIAN CANCER HISTORY: From the 06/16/2014 summary note:  "[name] returns today for review of her restaging studies accompanied by her husband [name]. To summarize her recent history: She was admitted to [facility] with a diagnosis of possible pneumonia in late December 2015. Chest x-ray showed some increased density in the right upper lobe. CT scan was suggested and was obtained by Dr. Inda Castle. This showed 2 small nodules in the right middle lobe, measuring 8 and 4 mm respectively. Dr. Jenetta Downer  [place] then contacted [name] for further evaluation and we set Cherl up for a PET scan and which was performed late January. This showed the 2 nodules in the lung not to be hypermetabolic. This of course does not prove that they are not malignant. More  importantly, there was a 13.7 cm cystic/solid mass in the upper pelvis associated with adenopathy,. All of this was hypermetabolic."  The patient was evaluated by gynecologic oncology and on 06/27/2014 she underwent surgery under Dr. Denman George. This consisted of an exploratory laparotomy with hysterectomy and abdominal salpingo-oophorectomy, as well as tumor debulking and omentectomy. 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 The tumor measured 11.5 cm, focally involve the ovarian capsule on the left; the uterus and right ovary and fallopian tube were unremarkable. One para-aortic lymph node was positive out of a total of 6 lymph nodes sampled (2 left common iliac, 2 left para-aortic, and to within the omental resection).  Her subsequent history is as detailed below   PAST MEDICAL HISTORY: Past Medical History:  Diagnosis Date  . Allergy   . Anemia    low iron hx  . Arthritis   . Breast cancer ([facility] [place]) 2014   a. 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 Cataract    removed ou  . Chronic combined systolic and diastolic CHF (congestive heart failure) ([place])    a. 08/2012 Echo: EF 55-60%, no rwma, mod MR;  b. 07/2014 Echo: EF 45-50%, distal antsept HK, mod TR/MR, mildly bil-atrial enlargement.  . Clear Cell Ovarian Cancer    a. 06/2014 s/p TAH/BSO debulking/lymph node dissection;  b. Now on chemo.  . Depression   . Diabetes mellitus   . DJD (degenerative joint disease) of lumbar spine   . DVT (deep venous thrombosis) ([facility])    hx of on HRT left leg ~2006  . Gallstones   . GERD (gastroesophageal reflux disease)   . Hyperlipidemia   . Hypertension   . Hypothyroidism   . Liver disease, chronic, with cirrhosis ([place])    ? autoimmune  . OSA on CPAP    cpap 4.5 setting  . Paroxysmal atrial fibrillation ([facility])      a. CHA2DS2VASc = 4-->coumadin.  [name] Physical deconditioning   . PPD positive, treated    rx inh   . Sleep apnea     PAST SURGICAL HISTORY: Past Surgical History:  Procedure Laterality Date  . ABDOMINAL HYSTERECTOMY N/A 06/27/2014   Procedure: HYSTERECTOMY ABDOMINAL TOTAL;  Surgeon: Everitt Amber, MD;  Location: [facility];  Service: Gynecology;  Laterality: N/A;  . BREAST BIOPSY     left breast  . BREAST EXCISIONAL BIOPSY Left 2002  . CATARACT EXTRACTION    . CHOLECYSTECTOMY    . EYE SURGERY    . FOOT SURGERY    . history of chemotherapy x 2 treatments, radiation tx  2014  . LAPAROTOMY N/A 06/27/2014   Procedure: EXPLORATORY LAPAROTOMY;  Surgeon: Everitt Amber, MD;  Location: [facility];  Service: Gynecology;  Laterality: N/A;  . MASTECTOMY Right 2014  . MASTECTOMY MODIFIED RADICAL  06/08/2012   Procedure: MASTECTOMY MODIFIED RADICAL;  Surgeon: Edward Jolly, MD;  Location: [facility];  Service: General;  Laterality: Right;  . pac removed  2015  . PEG TUBE PLACEMENT     peg removed 2015  . PERCUTANEOUS LIVER BIOPSY    .  PORTACATH PLACEMENT  06/08/2012   Procedure: INSERTION PORT-A-CATH;  Surgeon: Edward Jolly, MD;  Location: [facility];  Service: General;  Laterality: Left;  . SALPINGOOPHORECTOMY Bilateral 06/27/2014   Procedure: BILATERAL SALPINGO OOPHORECTOMY/TUMOR DEBULKING/LYMPHNODE DISSECTION, OMENTECTOMY;  Surgeon: Everitt Amber, MD;  Location: [facility];  Service: Gynecology;  Laterality: Bilateral;  . TUBAL LIGATION      FAMILY HISTORY Family History  Problem Relation Age of Onset  . Diabetes Mother   . Hypertension Mother   . Arthritis Mother   . Heart disease Mother   . Heart failure Mother   . Other Mother        benign breast mass  . Stroke Father   . Heart disease Father   . Dementia Father   . Diabetes Paternal Grandmother   . Colon cancer Paternal Grandfather        dx. 70s-80s  . Colon cancer Maternal Grandfather   . Other Daughter        potentially has had negative  BRCA testing  . Other Daughter        hysterectomy; potentially has had negative genetic testing  . Rectal cancer Neg Hx   . Esophageal cancer Neg Hx   . Liver cancer Neg Hx    the patient's father died in his 77s with a history of dementia. He had had prior strokes. The patient's mother died in her 69s, with a history of congestive heart failure. [name] had no brothers, one sister. There is no history of breast or ovarian cancer in the family.  GYNECOLOGIC HISTORY: Menarche age 62, first live birth age 38, she is GX P2, menopause approximately 15 years ago, on hormone replacement until 2010.  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 Her husband [name] works for [employer]. Daughter [name] is a Physiological scientist and lives in [place]. Daughter [name] and her family (husband and 2 children aged 66 and 3 years) currently live with the patient. Kierstyn is a member of a [employer].   ADVANCED DIRECTIVES: Not in place  HEALTH MAINTENANCE: (Updated October 2014) Social History   Tobacco Use  . Smoking status: Former Smoker    Packs/day: 1.00    Years: 1.00    Pack years: 1.00  . Smokeless tobacco: Former Network engineer Use Topics  . Alcohol use: No  . Drug use: No    Comment: quit age 25 only smoked as a teen 1 year     Colonoscopy: Never  PAP: Does not recall  Bone density: Never  Lipid panel:   Allergies  Allergen Reactions  . Olmesartan Medoxomil Cough  . Adhesive [Tape] Rash  . Tetracycline Hcl Other (See Comments)  . Venlafaxine Other (See Comments)    severe dry mouth    Current Outpatient Medications  Medication Sig Dispense Refill  . acetaminophen (TYLENOL) 500 MG tablet Take 500 mg by mouth every 6 (six) hours as needed for moderate pain.    [name] ALPRAZolam (XANAX) 0.5 MG tablet Take 1-2 tablets (0.5-1 mg total) 2 (two) times daily as  needed by mouth for anxiety. 180 tablet 0  . buPROPion (WELLBUTRIN XL) 150 MG 24 hr tablet TAKE 1 TABLET BY MOUTH  DAILY 90 tablet 1  . Continuous Blood Gluc Receiver (FREESTYLE LIBRE READER) DEVI Inject 1 Stick as directed daily. Use daily to check blood sugar. E11.9 1 Device 0  . escitalopram (  LEXAPRO) 20 MG tablet TAKE 1 TABLET BY MOUTH  EVERY MORNING 90 tablet 1  . famotidine (PEPCID) 20 MG tablet Take 1 tablet (20 mg total) by mouth daily. 30 tablet 0  . furosemide (LASIX) 20 MG tablet TAKE 1 TABLET BY MOUTH  DAILY 90 tablet 0  . glimepiride (AMARYL) 2 MG tablet TAKE 1 TABLET BY MOUTH TWO  TIMES DAILY 180 tablet 0  . glucose blood (BAYER CONTOUR NEXT TEST) test strip Use as instructed to check pt's blood sugar twice a day.  DX E11.9 100 each 5  . glucose blood (ONETOUCH VERIO) test strip USE TO CHECK BLOOD SUGAR TWICE DAILY 100 each 1  . ketoconazole (NIZORAL) 2 % cream Apply 1 application topically daily. 15 g 3  . letrozole (FEMARA) 2.5 MG tablet Take 1 tablet (2.5 mg total) by mouth daily. 90 tablet 4  . levothyroxine (SYNTHROID, LEVOTHROID) 150 MCG tablet TAKE 1 TABLET BY MOUTH  DAILY 90 tablet 0  . lidocaine-prilocaine (EMLA) cream Apply over port area 1-2 hours before chemotherapy 30 g 0  . metFORMIN (GLUCOPHAGE-XR) 500 MG 24 hr tablet TAKE 2 TABLETS BY MOUTH  EVERY EVENING 180 tablet 1  . metroNIDAZOLE (METROGEL) 1 % gel Apply topically daily. 45 g 0  . nadolol (CORGARD) 40 MG tablet Take 1 tablet (40 mg total) by mouth daily. 90 tablet 3  . NONFORMULARY OR COMPOUNDED ITEM Cmp, lipid, hgba1c, tsh--- dx , diabetes II, htn, hyperlipidemia, hypothyroidism 1 each 0  . OLANZapine (ZYPREXA) 2.5 MG tablet TAKE 1 TABLET NIGHTLY AT  BEDTIME 90 tablet 0  . omeprazole (PRILOSEC) 20 MG capsule TAKE 1 CAPSULE BY MOUTH  DAILY 90 capsule 0  . ondansetron (ZOFRAN) 8 MG tablet Take 1 tablet (8 mg total) by mouth every 8 (eight) hours as needed for nausea or vomiting. 270 tablet 0  . ONETOUCH DELICA  LANCETS 74Y MISC USE TO CHECK BLOOD SUGAR TWICE DAILY 100 each 0  . potassium chloride (K-DUR,KLOR-CON) 10 MEQ tablet TAKE 1 TABLET BY MOUTH TWO  TIMES DAILY 180 tablet 0  . pravastatin (PRAVACHOL) 20 MG tablet TAKE 1 TABLET BY MOUTH AT  BEDTIME 90 tablet 1  . propranolol (INDERAL) 20 MG tablet Take 1 tablet (20 mg total) by mouth 2 (two) times daily. 180 tablet 3  . sulfamethoxazole-trimethoprim (BACTRIM DS) 800-160 MG tablet Take 1 tablet by mouth 2 (two) times daily. 14 tablet 0   No current facility-administered medications for this visit.    Facility-Administered Medications Ordered in Other Visits  Medication Dose Route Frequency Provider Last Rate Last Dose  . sodium chloride 0.9 % injection 10 mL  10 mL Intracatheter PRN Magrinat, Virgie Dad, MD   10 mL at 04/28/17 0830    OBJECTIVE: Middle-aged white woman examined in a wheelchair  Vitals:   04/28/17 0840  BP: (!) 148/69  Pulse: 76  Resp: 16  Temp: 97.7 F (36.5 C)  SpO2: 94%     Body mass index is 32.53 kg/m.    ECOG FS: 2 Filed Weights   04/28/17 0840  Weight: 189 lb 8 oz (86 kg)   Sclerae unicteric, EOMs intact Oropharynx clear and moist No cervical or supraclavicular adenopathy Lungs no rales or rhonchi Heart regular rate and rhythm Abd soft, nontender, positive bowel sounds MSK no focal spinal tenderness, no upper extremity lymphedema Neuro: nonfocal, well oriented, appropriate affect Breasts: The right breast is status post mastectomy with no evidence of local recurrence.  The left breast is unremarkable.  Careful palpation of the right axilla shows no adenopathy.  The left axilla is also benign.   Lab Results  Component Value Date   WBC 2.6 (L) 04/28/2017   NEUTROABS 1.8 04/28/2017   HGB 11.6 04/28/2017   HCT 35.0 04/28/2017   MCV 99.8 04/28/2017   PLT 91 (L) 04/28/2017     Chemistry      Component Value Date/Time   NA 140 03/10/2017 0827   K 3.8 03/10/2017 0827   CL 108 02/20/2017 1020   CL 104  10/29/2012 1058   CO2 20 (L) 03/10/2017 0827   BUN 5.6 (L) 03/10/2017 0827   CREATININE 0.6 03/10/2017 0827      Component Value Date/Time   CALCIUM 8.8 03/10/2017 0827   ALKPHOS 156 (H) 03/10/2017 0827   AST 76 (H) 03/10/2017 0827   ALT 30 03/10/2017 0827   BILITOT 2.15 (H) 03/10/2017 0827      STUDIES: PET scan obtained in November reviewed with the patient  ASSESSMENT: 61 y.o.  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    (2)  treated in the adjuvant setting with docetaxel/ cyclophosphamide given every 3 weeks. There were multiple and severe complications, and the  patient tolerated only two cycles, last dose 07/29/2012  (3) letrozole started 08/16/2012  (a) osteopenia, with a T score of -1.14 April 2013  (4) postmastectomy radiation completed 12/24/2012  (5)  comorbidities include diabetes, hypertension, chronic liver disease with cirrhosis and fatty liver, and nutritional disturbance with temporarily dependence on PEG feeds (discontinued July 2014).  (6) restaging studies January 2016 show  (a) 8 mm and 4 mm Right middle lobe lung nodules, not metabolically active on PET -- will require follow-up  (b) hypermetabolic 40.9 cm mixed solid/cystic lesion in upper pelvis, with associated adenopathy  OVARIAN CANCER (7) status post exploratory laparotomy 06/27/2014 with total abdominal hysterectomy, bilateral salpingo-oophorectomy, para-aortic lymphadenectomy, omentectomy and radical tumor debulking for a left-sided clear cell ovarian cancer, pT1c pN1, stage IIIC .  (8) adjuvant chemotherapy consisting of carboplatin and paclitaxel given weekly days 1 and 8 of each 21 day cycle, for 6-8 cycles as tolerated, started 07/17/2014, with onpro support  (a) day 8  cycle 2 omitted and further treatments cancelled because of an episode of Klebsiella sepsis requiring intensive care unit admission-- last chemotherapy dose 03/26/2015  (9) bilateral lower extremity DVTs documented 09/25/2014 despite being on Coumadin for PAF (subtherapeutic INR) with mobile Right common femoral clot  (a) status post thrombolysis 09/30/2014  (b) on Lovenox daily as of 10/02/2014, stopped April 2017  (c) IVC filter placed 10/26/2014 as mobile clot still noted Right groin  (d) IVC filter removed 01/30/2015 with documented resolution of the movable thrombus 01/16/2015  (e) normal quantitative D-dimer 08/06/2015 and 09/24/2015  (10) started abraxane 10/13/2014, repeated every 14 days for 6 cycles (12 doses), completed 03/26/2015  (11) genetics testing December 04, 2014 through the Breast[facility]Ovarian Cancer Panel [facility] no deleterious mutations in ATM, BARD1, BRCA1, BRCA2, BRIP1, CDH1, CHEK2, FANCC, MLH1, MSH2, MSH6, NBN, PALB2, PMS2, PTEN, RAD51C, RAD51D, STK11, TP53, and XRCC2.    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  This is very favorable.  We went over her CT scan and PET scan results.  The PET scan in particular is very favorable.  I do not palpate any adenopathy at all in the right axilla.  We will simply need to keep an eye on that in the future.  Her borderline low white cells and platelets are going to be due to splenomegaly.  This is going to be related to her cirrhosis from fatty liver.  She will see me again in 3 months.  She will have her lab work a week before so we have all the results to  discuss.  Magrinat, Virgie Dad, MD  04/28/17 8:58 AM Medical Oncology and Hematology [facility] [address] [place], [place] [address] Tel. [phone]    Fax. [phone]  This document serves as a record of services personally performed by Chauncey Cruel, MD. It was created on his behalf by Margit Banda, a trained medical scribe. The creation of this record is based on the scribe's personal observations and the provider's statements to them. This document has been checked and approved by the attending provider.

## 2017-04-29 LAB — CA 125: Cancer Antigen (CA) 125: 236.3 U/mL — ABNORMAL HIGH (ref 0.0–38.1)

## 2017-04-29 NOTE — Telephone Encounter (Signed)
Thanks for letting me know. Labs appear stable. She is scheduled for a RUQ [name] in January. I would like to see her for routine follow up in January or February if you can help book. thanks

## 2017-05-01 LAB — HEPATIC FUNCTION PANEL
ALBUMIN: 2.9 g/dL — AB (ref 3.6–4.8)
ALK PHOS: 139 IU/L — AB (ref 39–117)
ALT: 19 IU/L (ref 0–32)
AST: 52 IU/L — AB (ref 0–40)
BILIRUBIN, DIRECT: 1.1 mg/dL — AB (ref 0.00–0.40)
Bilirubin Total: 2.2 mg/dL — ABNORMAL HIGH (ref 0.0–1.2)
Total Protein: 6.1 g/dL (ref 6.0–8.5)

## 2017-05-01 LAB — SPECIMEN STATUS REPORT

## 2017-05-06 ENCOUNTER — Ambulatory Visit: Payer: Medicare Other | Admitting: *Deleted

## 2017-05-10 ENCOUNTER — Other Ambulatory Visit: Payer: Self-pay | Admitting: Family Medicine

## 2017-05-11 ENCOUNTER — Encounter: Payer: Medicare Other | Admitting: Family Medicine

## 2017-05-11 ENCOUNTER — Other Ambulatory Visit: Payer: Self-pay | Admitting: Oncology

## 2017-05-11 DIAGNOSIS — Z139 Encounter for screening, unspecified: Secondary | ICD-10-CM

## 2017-06-05 ENCOUNTER — Other Ambulatory Visit: Payer: Medicare Other

## 2017-06-09 ENCOUNTER — Ambulatory Visit (HOSPITAL_COMMUNITY)
Admission: RE | Admit: 2017-06-09 | Discharge: 2017-06-09 | Disposition: A | Discharge status: Home / Self Care | Payer: Medicare Other | Source: Ambulatory Visit | Attending: Gastroenterology | Admitting: Gastroenterology

## 2017-06-09 DIAGNOSIS — K7460 Unspecified cirrhosis of liver: Secondary | ICD-10-CM | POA: Insufficient documentation

## 2017-06-09 DIAGNOSIS — Z9049 Acquired absence of other specified parts of digestive tract: Secondary | ICD-10-CM | POA: Diagnosis not present

## 2017-06-18 ENCOUNTER — Other Ambulatory Visit: Payer: Self-pay | Admitting: Oncology

## 2017-06-22 ENCOUNTER — Encounter: Payer: Self-pay | Admitting: Gastroenterology

## 2017-06-22 ENCOUNTER — Ambulatory Visit (INDEPENDENT_AMBULATORY_CARE_PROVIDER_SITE_OTHER): Payer: Medicare Other | Admitting: Gastroenterology

## 2017-06-22 VITALS — BP 110/56 | HR 62 | Ht 64.0 in | Wt 181.2 lb

## 2017-06-22 DIAGNOSIS — Z1211 Encounter for screening for malignant neoplasm of colon: Secondary | ICD-10-CM

## 2017-06-22 DIAGNOSIS — K7460 Unspecified cirrhosis of liver: Secondary | ICD-10-CM | POA: Diagnosis not present

## 2017-06-22 DIAGNOSIS — Z23 Encounter for immunization: Secondary | ICD-10-CM

## 2017-06-22 NOTE — Progress Notes (Signed)
HPI :  62 y/o female here for a follow up visit for cirrhosis:  She has a history of breast cancer / ovarian cancer and had been on chemotherapy in the past.  She appears to be in remission per Dr. Jana Hakim  Seen remotely by Dr. Olevia Perches for fatty liver and she underwent a liver biopsy in 2010 which showed cirrhosis - description shows steatosis and I suspect NASH, although ANA was markedly positive back then but histology not consistent with autoimmune hepatitis.  Since her last visit she had additional labs done.  Her ANA is positive positive but her smooth muscle antibody is negative as well as her immunoglobulin level.  She was vaccinated for hepatitis a and B.  We switched her from metoprolol to nadolol so she does not have to go for varices screening with EGD.  She could not her nadolol so she was then switched to propranolol which she is tolerating at 20 mg twice daily.  In general she states she is doing really well, she has no complaints today.  She denies any lower extremity edema, she denies any ascites.  She denies any jaundice.  She denies any bowel habit changes.  Her flu shots up-to-date, she has been vaccinated for shingles.  She has declined colonoscopy in the past and continues to decline this following discussion of colon cancer screening options today.  Her [place] screening was up-to-date with an ultrasound done last month which did not show any concerning changes of the liver that are new.  MRI LIVER 12/08/2016 - regenerating nodule, cirrhosis [name] 06/09/2017 - stable cirrhosis   Past Medical History:  Diagnosis Date  . Allergy   . Anemia    low iron hx  . Arthritis   . Breast cancer ([place]) 2014   a. 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 Cataract    removed ou  . Chronic combined systolic and diastolic CHF (congestive heart failure) ([place])    a. 08/2012 Echo: EF  55-60%, no rwma, mod MR;  b. 07/2014 Echo: EF 45-50%, distal antsept HK, mod TR/MR, mildly bil-atrial enlargement.  . Clear Cell Ovarian Cancer    a. 06/2014 s/p TAH/BSO debulking/lymph node dissection;  b. Now on chemo.  . Depression   . Diabetes mellitus   . DJD (degenerative joint disease) of lumbar spine   . DVT (deep venous thrombosis) ([facility])    hx of on HRT left leg ~2006  . Gallstones   . GERD (gastroesophageal reflux disease)   . Hyperlipidemia   . Hypertension   . Hypothyroidism   . Liver disease, chronic, with cirrhosis ([place])    ? autoimmune  . OSA on CPAP    cpap 4.5 setting  . Paroxysmal atrial fibrillation ([facility])    a. CHA2DS2VASc = 4-->coumadin.  [name] Physical deconditioning   . PPD positive, treated    rx inh   . Sleep apnea      Past Surgical History:  Procedure Laterality Date  . ABDOMINAL HYSTERECTOMY N/A 06/27/2014   Procedure: HYSTERECTOMY ABDOMINAL TOTAL;  Surgeon: Everitt Amber, MD;  Location: [facility];  Service: Gynecology;  Laterality: N/A;  . BREAST BIOPSY     left breast  . BREAST EXCISIONAL BIOPSY Left 2002  . CATARACT EXTRACTION    . CHOLECYSTECTOMY    . EYE SURGERY    . FOOT SURGERY    . history of chemotherapy x 2  treatments, radiation tx  2014  . LAPAROTOMY N/A 06/27/2014   Procedure: EXPLORATORY LAPAROTOMY;  Surgeon: Everitt Amber, MD;  Location: [facility];  Service: Gynecology;  Laterality: N/A;  . MASTECTOMY Right 2014  . MASTECTOMY MODIFIED RADICAL  06/08/2012   Procedure: MASTECTOMY MODIFIED RADICAL;  Surgeon: Edward Jolly, MD;  Location: [facility];  Service: General;  Laterality: Right;  . pac removed  2015  . PEG TUBE PLACEMENT     peg removed 2015  . PERCUTANEOUS LIVER BIOPSY    . PORTACATH PLACEMENT  06/08/2012   Procedure: INSERTION PORT-A-CATH;  Surgeon: Edward Jolly, MD;  Location: [facility];  Service: General;  Laterality: Left;  . SALPINGOOPHORECTOMY Bilateral 06/27/2014   Procedure: BILATERAL SALPINGO OOPHORECTOMY/TUMOR  DEBULKING/LYMPHNODE DISSECTION, OMENTECTOMY;  Surgeon: Everitt Amber, MD;  Location: [facility];  Service: Gynecology;  Laterality: Bilateral;  . TUBAL LIGATION     Family History  Problem Relation Age of Onset  . Diabetes Mother   . Hypertension Mother   . Arthritis Mother   . Heart disease Mother   . Heart failure Mother   . Other Mother        benign breast mass  . Stroke Father   . Heart disease Father   . Dementia Father   . Diabetes Paternal Grandmother   . Colon cancer Paternal Grandfather        dx. 70s-80s  . Colon cancer Maternal Grandfather   . Other Daughter        potentially has had negative BRCA testing  . Other Daughter        hysterectomy; potentially has had negative genetic testing  . Rectal cancer Neg Hx   . Esophageal cancer Neg Hx   . Liver cancer Neg Hx    Social History   Tobacco Use  . Smoking status: Former Smoker    Packs/day: 1.00    Years: 1.00    Pack years: 1.00  . Smokeless tobacco: Former Network engineer Use Topics  . Alcohol use: No  . Drug use: No    Comment: quit age 57 only smoked as a teen 1 year   Current Outpatient Medications  Medication Sig Dispense Refill  . acetaminophen (TYLENOL) 500 MG tablet Take 500 mg by mouth every 6 (six) hours as needed for moderate pain.    [name] ALPRAZolam (XANAX) 0.5 MG tablet TAKE 1 TO 2 TABLETS BY  MOUTH TWO TIMES DAILY AS  NEEDED FOR ANXIETY 120 tablet 0  . buPROPion (WELLBUTRIN XL) 150 MG 24 hr tablet TAKE 1 TABLET BY MOUTH  DAILY 90 tablet 1  . Continuous Blood Gluc Receiver (FREESTYLE LIBRE READER) DEVI Inject 1 Stick as directed daily. Use daily to check blood sugar. E11.9 1 Device 0  . escitalopram (LEXAPRO) 20 MG tablet TAKE 1 TABLET BY MOUTH  EVERY MORNING 90 tablet 1  . famotidine (PEPCID) 20 MG tablet Take 1 tablet (20 mg total) by mouth daily. 30 tablet 0  . furosemide (LASIX) 20 MG tablet TAKE 1 TABLET BY MOUTH  DAILY 90 tablet 0  . glimepiride (AMARYL) 2 MG tablet TAKE 1 TABLET BY MOUTH TWO   TIMES DAILY 180 tablet 0  . glucose blood (BAYER CONTOUR NEXT TEST) test strip Use as instructed to check pt's blood sugar twice a day.  DX E11.9 100 each 5  . glucose blood (ONETOUCH VERIO) test strip USE TO CHECK BLOOD SUGAR TWICE DAILY 100 each 1  . ketoconazole (NIZORAL) 2 % cream Apply 1  application topically daily. 15 g 3  . letrozole (FEMARA) 2.5 MG tablet Take 1 tablet (2.5 mg total) by mouth daily. 90 tablet 4  . levothyroxine (SYNTHROID, LEVOTHROID) 150 MCG tablet TAKE 1 TABLET BY MOUTH  DAILY 90 tablet 0  . lidocaine-prilocaine (EMLA) cream Apply over port area 1-2 hours before chemotherapy 30 g 0  . metFORMIN (GLUCOPHAGE-XR) 500 MG 24 hr tablet TAKE 2 TABLETS BY MOUTH  EVERY EVENING 180 tablet 1  . metroNIDAZOLE (METROGEL) 1 % gel Apply topically daily. 45 g 0  . nadolol (CORGARD) 40 MG tablet Take 1 tablet (40 mg total) by mouth daily. 90 tablet 3  . NONFORMULARY OR COMPOUNDED ITEM Cmp, lipid, hgba1c, tsh--- dx , diabetes II, htn, hyperlipidemia, hypothyroidism 1 each 0  . OLANZapine (ZYPREXA) 2.5 MG tablet TAKE 1 TABLET NIGHTLY AT  BEDTIME 90 tablet 0  . omeprazole (PRILOSEC) 20 MG capsule TAKE 1 CAPSULE BY MOUTH  DAILY 90 capsule 0  . ondansetron (ZOFRAN) 8 MG tablet Take 1 tablet (8 mg total) by mouth every 8 (eight) hours as needed for nausea or vomiting. 270 tablet 0  . ONETOUCH DELICA LANCETS 33G MISC USE TO CHECK BLOOD SUGAR TWICE DAILY 100 each 0  . potassium chloride (K-DUR,KLOR-CON) 10 MEQ tablet TAKE 1 TABLET BY MOUTH TWO  TIMES DAILY 180 tablet 0  . pravastatin (PRAVACHOL) 20 MG tablet TAKE 1 TABLET BY MOUTH AT  BEDTIME 90 tablet 1  . propranolol (INDERAL) 20 MG tablet Take 1 tablet (20 mg total) by mouth 2 (two) times daily. 180 tablet 3  . sulfamethoxazole-trimethoprim (BACTRIM DS) 800-160 MG tablet Take 1 tablet by mouth 2 (two) times daily. 14 tablet 0   No current facility-administered medications for this visit.    Allergies  Allergen Reactions  . Olmesartan  Medoxomil Cough  . Adhesive [Tape] Rash  . Tetracycline Hcl Other (See Comments)  . Venlafaxine Other (See Comments)    severe dry mouth     Review of Systems: All systems reviewed and negative except where noted in HPI.    Us Abdomen Limited Ruq  Result Date: 06/09/2017 CLINICAL DATA:  Cirrhosis.  Cholecystectomy. EXAM: ULTRASOUND ABDOMEN LIMITED RIGHT UPPER QUADRANT COMPARISON:  PET-CT 05/21/2016. FINDINGS: Gallbladder: Cholecystectomy. Common bile duct: Diameter: 6.2 mm Liver: Heterogeneous echotexture with nodular contour consistent patient's known cirrhosis. No focal hepatic abnormality identified. Portal vein is patent on color Doppler imaging with normal direction of blood flow towards the liver. IMPRESSION: 1.  Cholecystectomy.  No biliary distention. 2. Heterogeneous nodular hepatic echotexture consistent patient's known cirrhosis. No focal hepatic abnormality identified. Normal portal venous flow. Electronically Signed   By: Thomas  Register   On: 06/09/2017 09:15   Lab Results  Component Value Date   WBC 2.6 (L) 04/28/2017   HGB 11.6 04/28/2017   HCT 35.0 04/28/2017   MCV 99.8 04/28/2017   PLT 91 (L) 04/28/2017    Lab Results  Component Value Date   CREATININE 0.6 04/28/2017   BUN 6.5 (L) 04/28/2017   NA 140 04/28/2017   K 3.4 (L) 04/28/2017   CL 108 02/20/2017   CO2 22 04/28/2017    Lab Results  Component Value Date   ALT 21 04/28/2017   AST 53 (H) 04/28/2017   ALKPHOS 143 04/28/2017   BILITOT 2.96 (H) 04/28/2017    Lab Results  Component Value Date   INR 1.6 (H) 02/06/2017   INR 1.40 12/01/2016   INR 1.29 02/21/2015   PROTIME 14.4 (H) 11/03/2014     PROTIME 14.4 (H) 10/13/2014   PROTIME 14.4 (H) 10/06/2014     Physical Exam: BP (!) 110/56   Pulse 62   Ht 5' 4" (1.626 m)   Wt 181 lb 4 oz (82.2 kg)   BMI 31.11 kg/m  Constitutional: Pleasant,  female in no acute distress, sitting in wheel chair HEENT: Normocephalic and atraumatic. Conjunctivae are  normal. No scleral icterus. Neck supple.  Cardiovascular: Normal rate, regular rhythm.  Pulmonary/chest: Effort normal and breath sounds normal. No wheezing, rales or rhonchi. Abdominal: Soft, nondistended, nontender. No palpable masses Extremities: no edema Lymphadenopathy: No cervical adenopathy noted. Neurological: Alert and oriented to person place and time. Skin: Skin is warm and dry. No rashes noted. Psychiatric: Normal mood and affect. Behavior is normal.   ASSESSMENT AND PLAN: 62 year old female here for reassessment of the following issues  Cirrhosis - thought to be related to fatty liver / NASH.  She has compensated cirrhosis.  We discussed with cirrhosis is in regards to risks for long-term decompensations and HCC.  Her ultrasound imaging is up-to-date and she is due for an AFP level.  I will also repeat her INR today.  Repeat LFTs and CBC in 6 months.  She does not need variceal screening as she is already taking propranolol.  She received a vaccine for hepatitis A/B today.  She agreed with the plan I will see her again in 6 months for follow-up.  Colon cancer screening - I discussed colonoscopy and stool based testing with her.  She verbalized understanding standing of recommendations for screening, yet following this discussion she states she will never have a colonoscopy and declines screening at this time.  Fairwood Cellar, MD [facility] Pager [phone]

## 2017-06-22 NOTE — Patient Instructions (Addendum)
You will be due for an ultrasound in July 2019. We will call you to schedule when it gets closer to that time.   Please have lab work done when you see your primary care doctor. A PT/INR and an AFP.

## 2017-06-24 ENCOUNTER — Telehealth: Payer: Self-pay | Admitting: Oncology

## 2017-06-24 NOTE — Telephone Encounter (Signed)
Returned call to patient regarding rescheduling appointment. Left message for her to return our call.

## 2017-06-28 ENCOUNTER — Other Ambulatory Visit: Payer: Self-pay | Admitting: Family Medicine

## 2017-06-28 DIAGNOSIS — K219 Gastro-esophageal reflux disease without esophagitis: Secondary | ICD-10-CM

## 2017-06-30 ENCOUNTER — Telehealth: Payer: Self-pay | Admitting: Oncology

## 2017-06-30 NOTE — Telephone Encounter (Signed)
Patients husband called to r/s appointments because of his surgery. Patients schedule updated per husbands request.

## 2017-07-01 ENCOUNTER — Inpatient Hospital Stay: Payer: Medicare Other

## 2017-07-01 ENCOUNTER — Other Ambulatory Visit: Payer: Self-pay | Admitting: Gastroenterology

## 2017-07-01 ENCOUNTER — Other Ambulatory Visit: Payer: Self-pay | Admitting: *Deleted

## 2017-07-01 ENCOUNTER — Inpatient Hospital Stay: Payer: Medicare Other | Attending: Oncology

## 2017-07-01 ENCOUNTER — Other Ambulatory Visit: Payer: Self-pay

## 2017-07-01 DIAGNOSIS — R911 Solitary pulmonary nodule: Secondary | ICD-10-CM | POA: Diagnosis not present

## 2017-07-01 DIAGNOSIS — E119 Type 2 diabetes mellitus without complications: Secondary | ICD-10-CM | POA: Diagnosis not present

## 2017-07-01 DIAGNOSIS — C50811 Malignant neoplasm of overlapping sites of right female breast: Secondary | ICD-10-CM | POA: Diagnosis not present

## 2017-07-01 DIAGNOSIS — Z452 Encounter for adjustment and management of vascular access device: Secondary | ICD-10-CM | POA: Diagnosis not present

## 2017-07-01 DIAGNOSIS — Z95828 Presence of other vascular implants and grafts: Secondary | ICD-10-CM

## 2017-07-01 DIAGNOSIS — K7460 Unspecified cirrhosis of liver: Secondary | ICD-10-CM | POA: Insufficient documentation

## 2017-07-01 DIAGNOSIS — C562 Malignant neoplasm of left ovary: Secondary | ICD-10-CM | POA: Insufficient documentation

## 2017-07-01 DIAGNOSIS — R634 Abnormal weight loss: Secondary | ICD-10-CM | POA: Diagnosis not present

## 2017-07-01 DIAGNOSIS — I10 Essential (primary) hypertension: Secondary | ICD-10-CM | POA: Insufficient documentation

## 2017-07-01 DIAGNOSIS — C569 Malignant neoplasm of unspecified ovary: Secondary | ICD-10-CM

## 2017-07-01 LAB — CBC WITH DIFFERENTIAL/PLATELET
BASOS ABS: 0 10*3/uL (ref 0.0–0.1)
BASOS PCT: 0 %
EOS PCT: 2 %
Eosinophils Absolute: 0.1 10*3/uL (ref 0.0–0.5)
HCT: 35.3 % (ref 34.8–46.6)
Hemoglobin: 11.7 g/dL (ref 11.6–15.9)
Lymphocytes Relative: 24 %
Lymphs Abs: 0.8 10*3/uL — ABNORMAL LOW (ref 0.9–3.3)
MCH: 34.4 pg — ABNORMAL HIGH (ref 25.1–34.0)
MCHC: 33.1 g/dL (ref 31.5–36.0)
MCV: 103.8 fL — ABNORMAL HIGH (ref 79.5–101.0)
MONO ABS: 0.3 10*3/uL (ref 0.1–0.9)
Monocytes Relative: 9 %
Neutro Abs: 2.2 10*3/uL (ref 1.5–6.5)
Neutrophils Relative %: 65 %
PLATELETS: 95 10*3/uL — AB (ref 145–400)
RBC: 3.4 MIL/uL — ABNORMAL LOW (ref 3.70–5.45)
RDW: 15.7 % — AB (ref 11.2–14.5)
WBC: 3.4 10*3/uL — ABNORMAL LOW (ref 3.9–10.3)

## 2017-07-01 LAB — COMPREHENSIVE METABOLIC PANEL
ALBUMIN: 2.8 g/dL — AB (ref 3.5–5.0)
ALK PHOS: 167 U/L — AB (ref 40–150)
ALT: 25 U/L (ref 0–55)
ANION GAP: 10 (ref 3–11)
AST: 60 U/L — AB (ref 5–34)
BILIRUBIN TOTAL: 3.2 mg/dL — AB (ref 0.2–1.2)
BUN: 7 mg/dL (ref 7–26)
CO2: 22 mmol/L (ref 22–29)
Calcium: 9 mg/dL (ref 8.4–10.4)
Chloride: 109 mmol/L (ref 98–109)
Creatinine, Ser: 0.65 mg/dL (ref 0.60–1.10)
GFR calc Af Amer: >60 mL/min (ref >60)
GFR calc non Af Amer: >60 mL/min (ref >60)
GLUCOSE: 171 mg/dL — AB (ref 70–140)
Potassium: 3.5 mmol/L (ref 3.5–5.1)
Sodium: 141 mmol/L (ref 136–145)
TOTAL PROTEIN: 6.6 g/dL (ref 6.4–8.3)

## 2017-07-01 MED ORDER — SODIUM CHLORIDE 0.9 % IJ SOLN
10.0000 mL | INTRAMUSCULAR | Status: DC | PRN
  Administered 2017-07-01: 10 mL
  Filled 2017-07-01: qty 10

## 2017-07-01 MED ORDER — HEPARIN SOD (PORK) LOCK FLUSH 100 UNIT/ML IV SOLN
500.0000 [IU] | Freq: Once | INTRAVENOUS | Status: AC | PRN
  Administered 2017-07-01: 500 [IU]
  Filled 2017-07-01: qty 5

## 2017-07-02 LAB — CA 125: Cancer Antigen (CA) 125: 160.3 U/mL — ABNORMAL HIGH (ref 0.0–38.1)

## 2017-07-03 LAB — AFP TUMOR MARKER: AFP, Serum, Tumor Marker: 4.8 ng/mL (ref 0.0–8.3)

## 2017-07-03 LAB — SPECIMEN STATUS REPORT

## 2017-07-03 LAB — PROTIME-INR
INR: 1.5 — ABNORMAL HIGH (ref 0.8–1.2)
Prothrombin Time: 15.4 s — ABNORMAL HIGH (ref 9.1–12.0)

## 2017-07-06 NOTE — Progress Notes (Signed)
[facility]  [phone] [phone]    ID: [name]   DOB: 22-May-1955  MR#: [id number]  OIN#:[id number]  PCP: Rhonda Held, DO GYN:  SU: [facility] OTHER BS:JGGEZM [name], [name], [name]  CHIEF COMPLAINT:  Estrogen receptor positive Breast Cancer; Ovarian cancer; DVT  CURRENT TREATMENT: Letrozole  INTERVAL HISTORY: [name] returns today for follow-up and treatment of her estrogen receptor positive breast cancer and for follow-up of her ovarian cancer accomapanied by her hsuband. She continues on letrozole, with good tolerance. She denies issues with hot flashes or vaginal dryness.   Since her last visit, she completed a US of the abdomen on 06/09/2017 showing: Cholecystectomy.  No biliary distention. Heterogeneous nodular hepatic echotexture consistent patient's known cirrhosis. No focal hepatic abnormality identified. Normal portal venous flow  She is scheduled for a bone density that same day as her next mammogram on 07/23/2017  REVIEW OF SYSTEMS: [name] reports that knees have been hurting. She follows up with [name] on this issue, and she was given Cock's Comb shots (hyaluronic acid), but this yielded little relief for her. For exercise, she and her husband go to [employer] only once a week, but she is planning on going more often during the week. She uses a cane more often than a walker at home. She notes that her husband is going to have serious surgery, but his sister is going to help take care of him. She denies unusual headaches, visual changes, nausea, vomiting, or dizziness. There has been no unusual cough, phlegm production, or pleurisy. This been no change in bowel or bladder habits. She denies unexplained fatigue or unexplained weight loss, bleeding, rash, or fever. A detailed review of systems was otherwise stable.    BREAST CANCER HISTORY: From the original consult  note:  [name] noted a mass in her right breast mid December 2013, and as it did not spontaneously resolve over a couple of weeks she brought it to her primary physician's attention. She was set up for diagnostic mammography and right breast ultrasonography at the breast Center 05/10/2012. (Note that the patient's most recent prior mammography had been in October 2008). The current study showed a spiculated mass in the superior subareolar portion of the right breast measuring approximately 5 cm and associated with pleomorphic calcifications. This was firm and palpable. There was right nipple retraction and skin thickening. Ultrasound confirmed an irregularly marginated hypoechoic mass measuring 3.5 cm by ultrasound, and an abnormal appearing lower right axillary lymph node measuring 2.6 cm.  Biopsies of both the breast mass and the abnormal appearing lymph node were performed 05/21/2012. Both showed an invasive ductal carcinoma, grade 2, with similar prognostic panels (the breast mass was 100% estrogen and 73% progesterone receptor positive, with an MIB-1 of 5%; the lymph node was 100% estrogen 100% progesterone receptor positive, with an MIB-1 of 20%). Both masses were HER-2 negative.  Breast MRI obtained at [facility] 05/29/2012 confirmed a dominant mass in the retroareolar right breast measuring 4.4 cm maximally. There was a satellite nodule inferior and lateral to this mass, measuring 1.7 cm. There were no other masses in either breast. Aside from the previously biopsied lymph node there were other mildly enhancing level I right axillary lymph nodes which did not appear pathologic. There was no other lymphadenopathy noted.   The patient's subsequent history is as detailed below.  OVARIAN CANCER HISTORY: From the 06/16/2014 summary note:  "[name] returns today for  review of her restaging studies accompanied by her husband [name]. To summarize her recent history: She was admitted to [facility] with a diagnosis of possible pneumonia in late December 2015. Chest x-ray showed some increased density in the right upper lobe. CT scan was suggested and was obtained by [name]. This showed 2 small nodules in the right middle lobe, measuring 8 and 4 mm respectively. [name] then contacted [name] for further evaluation and we set [name] up for a PET scan and which was performed late January. This showed the 2 nodules in the lung not to be hypermetabolic. This of course does not prove that they are not malignant. More importantly, there was a 13.7 cm cystic/solid mass in the upper pelvis associated with adenopathy,. All of this was hypermetabolic."  The patient was evaluated by gynecologic oncology and on 06/27/2014 she underwent surgery under [name]. This consisted of an exploratory laparotomy with hysterectomy and abdominal salpingo-oophorectomy, as well as tumor debulking and omentectomy. 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 The tumor measured 11.5 cm, focally involve the ovarian capsule on the left; the uterus and right ovary and fallopian tube were unremarkable. One para-aortic lymph node was positive out of a total of 6 lymph nodes sampled (2 left common iliac, 2 left para-aortic, and to within the omental resection).  Her subsequent history is as detailed below   PAST MEDICAL HISTORY: Past Medical History:  Diagnosis Date  . Allergy   . Anemia    low iron hx  . Arthritis   . Breast cancer ([place]) 2014   a. 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 Cataract    removed ou  . Chronic combined systolic and diastolic CHF (congestive heart failure) ([place])    a. 08/2012 Echo: EF 55-60%, no rwma, mod MR;  b. 07/2014 Echo: EF 45-50%, distal antsept HK, mod TR/MR, mildly  bil-atrial enlargement.  . Clear Cell Ovarian Cancer    a. 06/2014 s/p TAH/BSO debulking/lymph node dissection;  b. Now on chemo.  . Depression   . Diabetes mellitus   . DJD (degenerative joint disease) of lumbar spine   . DVT (deep venous thrombosis) (H[facility])    hx of on HRT left leg ~2006  . Gallstones   . GERD (gastroesophageal reflux disease)   . Hyperlipidemia   . Hypertension   . Hypothyroidism   . Liver disease, chronic, with cirrhosis ([place])    ? autoimmune  . OSA on CPAP    cpap 4.5 setting  . Paroxysmal atrial fibrillation (H[facility])    a. CHA2DS2VASc = 4-->coumadin.  [name] Physical deconditioning   . PPD positive, treated    rx inh   . Sleep apnea     PAST SURGICAL HISTORY: Past Surgical History:  Procedure Laterality Date  . ABDOMINAL HYSTERECTOMY N/A 06/27/2014   Procedure: HYSTERECTOMY ABDOMINAL TOTAL;  Surgeon: Everitt Amber, MD;  Location: [facility];  Service: Gynecology;  Laterality: N/A;  . BREAST BIOPSY     left breast  . BREAST EXCISIONAL BIOPSY Left 2002  . CATARACT EXTRACTION    . CHOLECYSTECTOMY    . EYE SURGERY    . FOOT SURGERY    . history of chemotherapy x 2 treatments, radiation tx  2014  . LAPAROTOMY N/A 06/27/2014   Procedure: EXPLORATORY LAPAROTOMY;  Surgeon: Everitt Amber, MD;  Location: [facility];  Service: Gynecology;  Laterality: N/A;  . MASTECTOMY Right 2014  . MASTECTOMY MODIFIED RADICAL  06/08/2012   Procedure: MASTECTOMY MODIFIED RADICAL;  Surgeon: Edward Jolly, MD;  Location: [place];  Service: General;  Laterality: Right;  . pac removed  2015  . PEG TUBE PLACEMENT     peg removed 2015  . PERCUTANEOUS LIVER BIOPSY    . PORTACATH PLACEMENT  06/08/2012   Procedure: INSERTION PORT-A-CATH;  Surgeon: Edward Jolly, MD;  Location: [place];  Service: General;  Laterality: Left;  . SALPINGOOPHORECTOMY Bilateral 06/27/2014   Procedure: BILATERAL SALPINGO OOPHORECTOMY/TUMOR DEBULKING/LYMPHNODE DISSECTION, OMENTECTOMY;  Surgeon: Everitt Amber, MD;  Location: [facility];  Service: Gynecology;  Laterality: Bilateral;  . TUBAL LIGATION      FAMILY HISTORY Family History  Problem Relation Age of Onset  . Diabetes Mother   . Hypertension Mother   . Arthritis Mother   . Heart disease Mother   . Heart failure Mother   . Other Mother        benign breast mass  . Stroke Father   . Heart disease Father   . Dementia Father   . Diabetes Paternal Grandmother   . Colon cancer Paternal Grandfather        dx. 70s-80s  . Colon cancer Maternal Grandfather   . Other Daughter        potentially has had negative BRCA testing  . Other Daughter        hysterectomy; potentially has had negative genetic testing  . Rectal cancer Neg Hx   . Esophageal cancer Neg Hx   . Liver cancer Neg Hx    the patient's father died in his 54s with a history of dementia. He had had prior strokes. The patient's mother died in her 50s, with a history of congestive heart failure. [name] had no brothers, one sister. There is no history of breast or ovarian cancer in the family.  GYNECOLOGIC HISTORY: Menarche age 55, first live birth age 7, she is GX P2, menopause approximately 15 years ago, on hormone replacement until 2010.  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 Her husband [name] works for [employer]. Daughter [name] is a Physiological scientist and lives in [place]. Daughter [name] and her family (husband and 2 children aged 21 and 3 years) currently live with the patient. [name] is a member of a [employer].   ADVANCED DIRECTIVES: Not in place  HEALTH MAINTENANCE: (Updated October 2014) Social History   Tobacco Use  . Smoking status: Former Smoker    Packs/day: 1.00    Years: 1.00    Pack years: 1.00  . Smokeless tobacco: Former Network engineer Use Topics  . Alcohol use: No  . Drug use: No    Comment: quit age 89 only smoked as a teen 1  year     Colonoscopy: Never  PAP: Does not recall  Bone density: Never  Lipid panel:   Allergies  Allergen Reactions  . Olmesartan Medoxomil Cough  . Adhesive [Tape] Rash  . Tetracycline Hcl Other (See Comments)  . Venlafaxine Other (See Comments)    severe dry mouth    Current Outpatient Medications  Medication Sig Dispense Refill  . acetaminophen (TYLENOL) 500 MG tablet Take 500 mg by mouth every 6 (six) hours as needed for moderate pain.    [name]  ALPRAZolam (XANAX) 0.5 MG tablet TAKE 1 TO 2 TABLETS BY  MOUTH TWO TIMES DAILY AS  NEEDED FOR ANXIETY 120 tablet 0  . buPROPion (WELLBUTRIN XL) 150 MG 24 hr tablet TAKE 1 TABLET BY MOUTH  DAILY 90 tablet 1  . Continuous Blood Gluc Receiver (FREESTYLE LIBRE READER) DEVI Inject 1 Stick as directed daily. Use daily to check blood sugar. E11.9 1 Device 0  . escitalopram (LEXAPRO) 20 MG tablet TAKE 1 TABLET BY MOUTH  EVERY MORNING 90 tablet 1  . famotidine (PEPCID) 20 MG tablet Take 1 tablet (20 mg total) by mouth daily. 30 tablet 0  . furosemide (LASIX) 20 MG tablet TAKE 1 TABLET BY MOUTH  DAILY 90 tablet 0  . glimepiride (AMARYL) 2 MG tablet TAKE 1 TABLET BY MOUTH TWO  TIMES DAILY 180 tablet 0  . glucose blood (BAYER CONTOUR NEXT TEST) test strip Use as instructed to check pt's blood sugar twice a day.  DX E11.9 100 each 5  . glucose blood (ONETOUCH VERIO) test strip USE TO CHECK BLOOD SUGAR TWICE DAILY 100 each 1  . ketoconazole (NIZORAL) 2 % cream Apply 1 application topically daily. 15 g 3  . letrozole (FEMARA) 2.5 MG tablet Take 1 tablet (2.5 mg total) by mouth daily. 90 tablet 4  . levothyroxine (SYNTHROID, LEVOTHROID) 150 MCG tablet TAKE 1 TABLET BY MOUTH  DAILY 90 tablet 0  . lidocaine-prilocaine (EMLA) cream Apply over port area 1-2 hours before chemotherapy 30 g 0  . metFORMIN (GLUCOPHAGE-XR) 500 MG 24 hr tablet TAKE 2 TABLETS BY MOUTH  EVERY EVENING 180 tablet 1  . metroNIDAZOLE (METROGEL) 1 % gel Apply topically daily. 45 g 0  .  nadolol (CORGARD) 40 MG tablet Take 1 tablet (40 mg total) by mouth daily. 90 tablet 3  . NONFORMULARY OR COMPOUNDED ITEM Cmp, lipid, hgba1c, tsh--- dx , diabetes II, htn, hyperlipidemia, hypothyroidism 1 each 0  . OLANZapine (ZYPREXA) 2.5 MG tablet TAKE 1 TABLET NIGHTLY AT  BEDTIME 90 tablet 0  . omeprazole (PRILOSEC) 20 MG capsule TAKE 1 CAPSULE BY MOUTH  DAILY 90 capsule 0  . ondansetron (ZOFRAN) 8 MG tablet Take 1 tablet (8 mg total) by mouth every 8 (eight) hours as needed for nausea or vomiting. 270 tablet 0  . ONETOUCH DELICA LANCETS 94R MISC USE TO CHECK BLOOD SUGAR TWICE DAILY 100 each 0  . potassium chloride (K-DUR,KLOR-CON) 10 MEQ tablet TAKE 1 TABLET BY MOUTH TWO  TIMES DAILY 180 tablet 0  . pravastatin (PRAVACHOL) 20 MG tablet TAKE 1 TABLET BY MOUTH AT  BEDTIME 90 tablet 1  . propranolol (INDERAL) 20 MG tablet Take 1 tablet (20 mg total) by mouth 2 (two) times daily. 180 tablet 3  . sulfamethoxazole-trimethoprim (BACTRIM DS) 800-160 MG tablet Take 1 tablet by mouth 2 (two) times daily. 14 tablet 0   No current facility-administered medications for this visit.     OBJECTIVE: Middle-aged white woman examined in a wheelchair  Vitals:   07/08/17 0832  BP: (!) 121/49  Pulse: 64  Resp: 18  Temp: 97.8 F (36.6 C)  SpO2: 99%     Body mass index is 30.36 kg/m.    ECOG FS: 2 Filed Weights   07/08/17 0832  Weight: 176 lb 14.4 oz (80.2 kg)   Sclerae unicteric, pupils round and equal No cervical or supraclavicular adenopathy Lungs no rales or rhonchi Heart regular rate and rhythm Abd soft, nontender, positive bowel sounds MSK no focal spinal tenderness, no upper  extremity lymphedema Neuro: nonfocal, well oriented, appropriate affect Breasts: The right breast has undergone mastectomy.  There is no evidence of chest wall recurrence.  Left breast is benign.  Both axillae are benign.   Lab Results  Component Value Date   WBC 3.4 (L) 07/01/2017   NEUTROABS 2.2 07/01/2017   HGB  11.7 07/01/2017   HCT 35.3 07/01/2017   MCV 103.8 (H) 07/01/2017   PLT 95 (L) 07/01/2017     Chemistry      Component Value Date/Time   NA 141 07/01/2017 0937   NA 140 04/28/2017 0809   K 3.5 07/01/2017 0937   K 3.4 (L) 04/28/2017 0809   CL 109 07/01/2017 0937   CL 104 10/29/2012 1058   CO2 22 07/01/2017 0937   CO2 22 04/28/2017 0809   BUN 7 07/01/2017 0937   BUN 6.5 (L) 04/28/2017 0809   CREATININE 0.65 07/01/2017 0937   CREATININE 0.6 04/28/2017 0809      Component Value Date/Time   CALCIUM 9.0 07/01/2017 0937   CALCIUM 8.4 04/28/2017 0809   ALKPHOS 167 (H) 07/01/2017 0937   ALKPHOS 143 04/28/2017 0809   AST 60 (H) 07/01/2017 0937   AST 53 (H) 04/28/2017 0809   ALT 25 07/01/2017 0937   ALT 21 04/28/2017 0809   BILITOT 3.2 (H) 07/01/2017 0937   BILITOT 2.96 (H) 04/28/2017 0809      STUDIES: Unilateral left screening mammography due next month  ASSESSMENT: 62 y.o.  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    (2)  treated in the adjuvant setting with docetaxel/ cyclophosphamide given every 3 weeks. There were multiple and severe complications, and the  patient tolerated only two cycles, last dose 07/29/2012  (3) letrozole started 08/16/2012  (a) osteopenia, with a T score of -1.14 April 2013  (4) postmastectomy radiation completed 12/24/2012  (5)  comorbidities include diabetes, hypertension, chronic liver disease with cirrhosis and fatty liver, and nutritional disturbance with temporarily dependence on PEG feeds (discontinued July 2014).  (6) restaging studies January 2016 show  (a) 8 mm and 4 mm Right middle lobe lung nodules, not metabolically active on PET -- will require follow-up  (b) hypermetabolic 37.9 cm mixed solid/cystic lesion  in upper pelvis, with associated adenopathy  OVARIAN CANCER (7) status post exploratory laparotomy 06/27/2014 with total abdominal hysterectomy, bilateral salpingo-oophorectomy, para-aortic lymphadenectomy, omentectomy and radical tumor debulking for a left-sided clear cell ovarian cancer, pT1c pN1, stage IIIC .  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 despite being on Coumadin for PAF (subtherapeutic INR) with mobile Right common femoral clot  (a) status post thrombolysis 09/30/2014  (b) on Lovenox daily as of 10/02/2014, stopped April 2017  (c) IVC filter placed 10/26/2014 as mobile clot still noted Right groin  (d) IVC filter removed 01/30/2015 with documented resolution of the movable thrombus 01/16/2015  (e) normal quantitative D-dimer 08/06/2015 and 09/24/2015  (10) started abraxane 10/13/2014, repeated every 14 days for 6 cycles (  12 doses), completed 03/26/2015  (11) genetics testing December 04, 2014 through the Breast[facility]Ovarian Cancer Panel [facility] no deleterious mutations in ATM, BARD1, BRCA1, BRCA2, BRIP1, CDH1, CHEK2, FANCC, MLH1, MSH2, MSH6, NBN, PALB2, PMS2, PTEN, RAD51C, RAD51D, STK11, TP53, and XRCC2.    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  There is no definite evidence of recurrence of either of these cancer, which is very favorable.  She continues on letrozole with good tolerance and the plan is to continue that at least an additional 2 years.  She has a bone density planned for early next month.  If there has been significant loss we will consider denosumab/Prolia.  She does have an elevated CA 125, but this is very variable, and it dropped by almost 50% with no intervening treatment.  At this point this requires only follow-up.  Dwanda has lost about 12pounds.  It is difficult to tell exactly why this is.  She has cirrhosis, diabetes, heart issues, kidney issues, and multiple other problems, so I would not jump to the conclusion that she has active cancer as the main problem.  The weight loss could be something as simple as the fact that she is not exercising and she is losing muscle weight  However if her weight loss does continue we will restage her  Otherwise she will have lab work and 8 weeks and then again in 16 weeks and see me shortly after that  Note that [name] will be having back surgery which will incapacitate him for the next several months.  This will be very difficult on them since Deja really does depend on for everything   Magrinat, Virgie Dad, MD  07/08/17 8:55 AM Medical Oncology and Hematology [facility] [place], [place] [address] Tel. [phone]    Fax. [phone]  This document serves as a record of services personally performed by Lurline Del, MD. It was created on his behalf by Sheron Nightingale, a trained medical scribe. The creation of this record is based on the scribe's personal observations and the provider's statements to them.   I have reviewed the above documentation for accuracy and completeness, and I agree with the above.

## 2017-07-08 ENCOUNTER — Telehealth: Payer: Self-pay | Admitting: Oncology

## 2017-07-08 ENCOUNTER — Inpatient Hospital Stay (HOSPITAL_BASED_OUTPATIENT_CLINIC_OR_DEPARTMENT_OTHER): Payer: Medicare Other | Admitting: Oncology

## 2017-07-08 VITALS — BP 121/49 | HR 64 | Temp 97.8°F | Resp 18 | Ht 64.0 in | Wt 176.9 lb

## 2017-07-08 DIAGNOSIS — E119 Type 2 diabetes mellitus without complications: Secondary | ICD-10-CM | POA: Diagnosis not present

## 2017-07-08 DIAGNOSIS — C772 Secondary and unspecified malignant neoplasm of intra-abdominal lymph nodes: Secondary | ICD-10-CM

## 2017-07-08 DIAGNOSIS — C50811 Malignant neoplasm of overlapping sites of right female breast: Secondary | ICD-10-CM | POA: Diagnosis not present

## 2017-07-08 DIAGNOSIS — Z170 Estrogen receptor positive status [ER+]: Secondary | ICD-10-CM

## 2017-07-08 DIAGNOSIS — I482 Chronic atrial fibrillation, unspecified: Secondary | ICD-10-CM

## 2017-07-08 DIAGNOSIS — R911 Solitary pulmonary nodule: Secondary | ICD-10-CM

## 2017-07-08 DIAGNOSIS — I10 Essential (primary) hypertension: Secondary | ICD-10-CM | POA: Diagnosis not present

## 2017-07-08 DIAGNOSIS — R634 Abnormal weight loss: Secondary | ICD-10-CM

## 2017-07-08 DIAGNOSIS — E1165 Type 2 diabetes mellitus with hyperglycemia: Secondary | ICD-10-CM

## 2017-07-08 DIAGNOSIS — K744 Secondary biliary cirrhosis: Secondary | ICD-10-CM

## 2017-07-08 DIAGNOSIS — IMO0002 Reserved for concepts with insufficient information to code with codable children: Secondary | ICD-10-CM

## 2017-07-08 DIAGNOSIS — C562 Malignant neoplasm of left ovary: Secondary | ICD-10-CM

## 2017-07-08 DIAGNOSIS — K7460 Unspecified cirrhosis of liver: Secondary | ICD-10-CM

## 2017-07-08 DIAGNOSIS — D696 Thrombocytopenia, unspecified: Secondary | ICD-10-CM

## 2017-07-08 DIAGNOSIS — I5032 Chronic diastolic (congestive) heart failure: Secondary | ICD-10-CM

## 2017-07-08 DIAGNOSIS — E1151 Type 2 diabetes mellitus with diabetic peripheral angiopathy without gangrene: Secondary | ICD-10-CM

## 2017-07-08 NOTE — Telephone Encounter (Signed)
Gave patient AVs and calendar of upcoming April through July appointments.  °

## 2017-07-09 ENCOUNTER — Emergency Department (HOSPITAL_COMMUNITY): Payer: Medicare Other

## 2017-07-09 ENCOUNTER — Other Ambulatory Visit: Payer: Self-pay

## 2017-07-09 ENCOUNTER — Encounter (HOSPITAL_COMMUNITY): Payer: Self-pay

## 2017-07-09 ENCOUNTER — Emergency Department (HOSPITAL_COMMUNITY)
Admission: EM | Admit: 2017-07-09 | Discharge: 2017-07-09 | Disposition: A | Discharge status: Home / Self Care | Payer: Medicare Other | Attending: Physician Assistant | Admitting: Physician Assistant

## 2017-07-09 DIAGNOSIS — R51 Headache: Secondary | ICD-10-CM | POA: Insufficient documentation

## 2017-07-09 DIAGNOSIS — R519 Headache, unspecified: Secondary | ICD-10-CM

## 2017-07-09 MED ORDER — ACETAMINOPHEN 500 MG PO TABS
500.0000 mg | ORAL_TABLET | Freq: Once | ORAL | Status: AC
Start: 2017-07-09 — End: 2017-07-09
  Administered 2017-07-09: 500 mg via ORAL
  Filled 2017-07-09: qty 1
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
## 2017-07-09 NOTE — ED Notes (Signed)
RN informed family that as soon as discharge orders are in we will discharge the patient
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
## 2017-07-09 NOTE — Discharge Instructions (Signed)
Your imaging was normal.  Please take Motrin and Tylenol for headaches.  Follow-up with your primary care doctor for recheck and return to ED with any worsening symptoms.

## 2017-07-17 ENCOUNTER — Other Ambulatory Visit: Payer: Medicare Other

## 2017-07-17 ENCOUNTER — Ambulatory Visit: Payer: Medicare Other

## 2017-07-21 ENCOUNTER — Other Ambulatory Visit: Payer: Medicare Other

## 2017-07-23 ENCOUNTER — Ambulatory Visit: Payer: Medicare Other

## 2017-07-23 ENCOUNTER — Other Ambulatory Visit: Payer: Self-pay | Admitting: Family Medicine

## 2017-07-23 ENCOUNTER — Inpatient Hospital Stay: Admission: RE | Admit: 2017-07-23 | Payer: Medicare Other | Source: Ambulatory Visit

## 2017-07-26 ENCOUNTER — Other Ambulatory Visit: Payer: Self-pay | Admitting: Family Medicine

## 2017-07-28 ENCOUNTER — Ambulatory Visit: Payer: Medicare Other | Admitting: Oncology

## 2017-08-06 ENCOUNTER — Other Ambulatory Visit: Payer: Self-pay | Admitting: Family Medicine

## 2017-08-06 DIAGNOSIS — I10 Essential (primary) hypertension: Secondary | ICD-10-CM

## 2017-08-12 ENCOUNTER — Telehealth: Payer: Self-pay | Admitting: Oncology

## 2017-08-12 NOTE — Telephone Encounter (Signed)
Patient called to reschedule  °

## 2017-08-14 ENCOUNTER — Ambulatory Visit
Admission: RE | Admit: 2017-08-14 | Discharge: 2017-08-14 | Disposition: A | Discharge status: Home / Self Care | Payer: Medicare Other | Source: Ambulatory Visit | Attending: Oncology | Admitting: Oncology

## 2017-08-14 DIAGNOSIS — C50811 Malignant neoplasm of overlapping sites of right female breast: Secondary | ICD-10-CM

## 2017-08-14 DIAGNOSIS — Z170 Estrogen receptor positive status [ER+]: Secondary | ICD-10-CM

## 2017-08-14 DIAGNOSIS — C772 Secondary and unspecified malignant neoplasm of intra-abdominal lymph nodes: Secondary | ICD-10-CM

## 2017-08-14 DIAGNOSIS — C562 Malignant neoplasm of left ovary: Secondary | ICD-10-CM

## 2017-08-17 ENCOUNTER — Other Ambulatory Visit: Payer: Self-pay | Admitting: Oncology

## 2017-08-18 ENCOUNTER — Telehealth: Payer: Self-pay

## 2017-08-18 NOTE — Telephone Encounter (Signed)
Received call from [facility] regarding denial from our office for pt's prescription of Xanax. Noted that prescription was faxed to [facility] at 11:34 pm this am, I notified Raquel Sarna pharmacist there and she could not see where the fax had been received so she took a verbal order instead so it could be processed at this time. Pt's husband had also left VM and wondered why the prescription was denied. I returned his call let him know that the prescription was being processed.

## 2017-08-21 ENCOUNTER — Other Ambulatory Visit: Payer: Self-pay | Admitting: *Deleted

## 2017-08-21 MED ORDER — ALPRAZOLAM 0.5 MG PO TABS: 0.5000 mg | ORAL_TABLET | Freq: Two times a day (BID) | ORAL | 0 refills | Status: DC | PRN

## 2017-08-21 NOTE — Telephone Encounter (Signed)
Prescription for 90 day supply for alprazolam printed and faxed to [facility].  Of note pt can obtain 90 day supply at no cost.

## 2017-08-26 ENCOUNTER — Other Ambulatory Visit: Payer: Self-pay

## 2017-09-02 ENCOUNTER — Other Ambulatory Visit: Payer: Medicare Other

## 2017-09-02 ENCOUNTER — Inpatient Hospital Stay: Payer: Medicare Other | Attending: Oncology

## 2017-09-02 ENCOUNTER — Inpatient Hospital Stay: Payer: Medicare Other

## 2017-09-02 DIAGNOSIS — C562 Malignant neoplasm of left ovary: Secondary | ICD-10-CM | POA: Insufficient documentation

## 2017-09-02 DIAGNOSIS — C50811 Malignant neoplasm of overlapping sites of right female breast: Secondary | ICD-10-CM | POA: Diagnosis present

## 2017-09-02 DIAGNOSIS — C772 Secondary and unspecified malignant neoplasm of intra-abdominal lymph nodes: Secondary | ICD-10-CM | POA: Insufficient documentation

## 2017-09-02 DIAGNOSIS — C569 Malignant neoplasm of unspecified ovary: Secondary | ICD-10-CM

## 2017-09-02 DIAGNOSIS — Z95828 Presence of other vascular implants and grafts: Secondary | ICD-10-CM

## 2017-09-02 LAB — COMPREHENSIVE METABOLIC PANEL
ALK PHOS: 136 U/L (ref 40–150)
ALT: 21 U/L (ref 0–55)
ANION GAP: 8 (ref 3–11)
AST: 48 U/L — ABNORMAL HIGH (ref 5–34)
Albumin: 2.8 g/dL — ABNORMAL LOW (ref 3.5–5.0)
BILIRUBIN TOTAL: 2.7 mg/dL — AB (ref 0.2–1.2)
BUN: 7 mg/dL (ref 7–26)
CALCIUM: 8.7 mg/dL (ref 8.4–10.4)
CO2: 22 mmol/L (ref 22–29)
CREATININE: 0.6 mg/dL (ref 0.60–1.10)
Chloride: 108 mmol/L (ref 98–109)
GFR calc non Af Amer: >60 mL/min (ref >60)
Glucose, Bld: 133 mg/dL (ref 70–140)
Potassium: 3.5 mmol/L (ref 3.5–5.1)
Sodium: 138 mmol/L (ref 136–145)
Total Protein: 6.3 g/dL — ABNORMAL LOW (ref 6.4–8.3)

## 2017-09-02 LAB — CBC WITH DIFFERENTIAL/PLATELET
BASOS PCT: 0 %
Basophils Absolute: 0 10*3/uL (ref 0.0–0.1)
EOS ABS: 0.1 10*3/uL (ref 0.0–0.5)
Eosinophils Relative: 2 %
HEMATOCRIT: 36.2 % (ref 34.8–46.6)
Hemoglobin: 12.1 g/dL (ref 11.6–15.9)
Lymphocytes Relative: 22 %
Lymphs Abs: 0.7 10*3/uL — ABNORMAL LOW (ref 0.9–3.3)
MCH: 34.5 pg — ABNORMAL HIGH (ref 25.1–34.0)
MCHC: 33.4 g/dL (ref 31.5–36.0)
MCV: 103.2 fL — ABNORMAL HIGH (ref 79.5–101.0)
MONO ABS: 0.3 10*3/uL (ref 0.1–0.9)
MONOS PCT: 9 %
NEUTROS ABS: 2.2 10*3/uL (ref 1.5–6.5)
NEUTROS PCT: 67 %
Platelets: 89 10*3/uL — ABNORMAL LOW (ref 145–400)
RBC: 3.5 MIL/uL — ABNORMAL LOW (ref 3.70–5.45)
RDW: 14.4 % (ref 11.2–14.5)
WBC: 3.3 10*3/uL — ABNORMAL LOW (ref 3.9–10.3)

## 2017-09-02 MED ORDER — SODIUM CHLORIDE 0.9% FLUSH
10.0000 mL | INTRAVENOUS | Status: DC | PRN
  Administered 2017-09-02: 10 mL via INTRAVENOUS
  Filled 2017-09-02: qty 10

## 2017-09-02 MED ORDER — HEPARIN SOD (PORK) LOCK FLUSH 100 UNIT/ML IV SOLN
500.0000 [IU] | Freq: Once | INTRAVENOUS | Status: AC
  Administered 2017-09-02: 500 [IU] via INTRAVENOUS
  Filled 2017-09-02: qty 5

## 2017-09-02 NOTE — Patient Instructions (Signed)

## 2017-09-03 LAB — CA 125: CANCER ANTIGEN (CA) 125: 144.3 U/mL — AB (ref 0.0–38.1)

## 2017-09-13 ENCOUNTER — Other Ambulatory Visit: Payer: Self-pay | Admitting: Gastroenterology

## 2017-09-13 ENCOUNTER — Other Ambulatory Visit: Payer: Self-pay | Admitting: Oncology

## 2017-09-13 ENCOUNTER — Other Ambulatory Visit: Payer: Self-pay | Admitting: Family Medicine

## 2017-09-13 DIAGNOSIS — K219 Gastro-esophageal reflux disease without esophagitis: Secondary | ICD-10-CM

## 2017-10-05 ENCOUNTER — Other Ambulatory Visit: Payer: Self-pay | Admitting: Family Medicine

## 2017-10-13 ENCOUNTER — Telehealth: Payer: Self-pay | Admitting: *Deleted

## 2017-10-13 ENCOUNTER — Other Ambulatory Visit: Payer: Self-pay | Admitting: Family Medicine

## 2017-10-13 ENCOUNTER — Other Ambulatory Visit: Payer: Self-pay | Admitting: Oncology

## 2017-10-13 DIAGNOSIS — IMO0002 Reserved for concepts with insufficient information to code with codable children: Secondary | ICD-10-CM

## 2017-10-13 DIAGNOSIS — E1165 Type 2 diabetes mellitus with hyperglycemia: Principal | ICD-10-CM

## 2017-10-13 DIAGNOSIS — I10 Essential (primary) hypertension: Secondary | ICD-10-CM

## 2017-10-13 DIAGNOSIS — E1151 Type 2 diabetes mellitus with diabetic peripheral angiopathy without gangrene: Secondary | ICD-10-CM

## 2017-10-13 DIAGNOSIS — E785 Hyperlipidemia, unspecified: Secondary | ICD-10-CM

## 2017-10-13 NOTE — Telephone Encounter (Signed)
"  Called [facility] for refill of Lidocaine/Prilocaine cream.  It is no longer covered by insurance.  She needs something else ordered.  Before June 27th appointment"  Call transferred to collaborative for new orders to facilitate prior authorization process.

## 2017-10-14 ENCOUNTER — Telehealth: Payer: Self-pay | Admitting: Oncology

## 2017-10-14 NOTE — Telephone Encounter (Signed)
patient called to reschedule °

## 2017-10-28 ENCOUNTER — Telehealth: Payer: Self-pay

## 2017-10-28 NOTE — Telephone Encounter (Signed)
Spoke with patient and her husband reminding of SCP visit with NP on 11/09/17 at 10 am.  Patient confirms she will come to appt.

## 2017-11-04 ENCOUNTER — Other Ambulatory Visit: Payer: Medicare Other

## 2017-11-04 ENCOUNTER — Telehealth: Payer: Self-pay

## 2017-11-04 ENCOUNTER — Ambulatory Visit: Payer: Medicare Other | Admitting: Oncology

## 2017-11-04 NOTE — Telephone Encounter (Signed)
Spoke with patient about appts on 7/1.  She wishes to keep appt with Dr. Jana Hakim and cancel SCP.  NP will print out information and mail to patient per her request.

## 2017-11-05 ENCOUNTER — Inpatient Hospital Stay: Payer: Medicare Other

## 2017-11-05 ENCOUNTER — Inpatient Hospital Stay: Payer: Medicare Other | Attending: Oncology

## 2017-11-05 ENCOUNTER — Other Ambulatory Visit: Payer: Self-pay | Admitting: *Deleted

## 2017-11-05 DIAGNOSIS — C50811 Malignant neoplasm of overlapping sites of right female breast: Secondary | ICD-10-CM | POA: Diagnosis present

## 2017-11-05 DIAGNOSIS — C772 Secondary and unspecified malignant neoplasm of intra-abdominal lymph nodes: Secondary | ICD-10-CM | POA: Insufficient documentation

## 2017-11-05 DIAGNOSIS — Z95828 Presence of other vascular implants and grafts: Secondary | ICD-10-CM

## 2017-11-05 DIAGNOSIS — C562 Malignant neoplasm of left ovary: Secondary | ICD-10-CM | POA: Insufficient documentation

## 2017-11-05 DIAGNOSIS — C569 Malignant neoplasm of unspecified ovary: Secondary | ICD-10-CM

## 2017-11-05 LAB — CBC WITH DIFFERENTIAL/PLATELET
Basophils Absolute: 0 10*3/uL (ref 0.0–0.1)
Basophils Relative: 0 %
Eosinophils Absolute: 0.1 10*3/uL (ref 0.0–0.5)
Eosinophils Relative: 2 %
HEMATOCRIT: 34.1 % — AB (ref 34.8–46.6)
HEMOGLOBIN: 11.5 g/dL — AB (ref 11.6–15.9)
LYMPHS ABS: 0.5 10*3/uL — AB (ref 0.9–3.3)
LYMPHS PCT: 17 %
MCH: 34.9 pg — AB (ref 25.1–34.0)
MCHC: 33.6 g/dL (ref 31.5–36.0)
MCV: 103.8 fL — AB (ref 79.5–101.0)
Monocytes Absolute: 0.2 10*3/uL (ref 0.1–0.9)
Monocytes Relative: 7 %
NEUTROS PCT: 74 %
Neutro Abs: 2.3 10*3/uL (ref 1.5–6.5)
Platelets: 81 10*3/uL — ABNORMAL LOW (ref 145–400)
RBC: 3.29 MIL/uL — AB (ref 3.70–5.45)
RDW: 15.1 % — ABNORMAL HIGH (ref 11.2–14.5)
WBC: 3.1 10*3/uL — AB (ref 3.9–10.3)

## 2017-11-05 LAB — COMPREHENSIVE METABOLIC PANEL
ALT: 24 U/L (ref 0–44)
AST: 47 U/L — AB (ref 15–41)
Albumin: 3 g/dL — ABNORMAL LOW (ref 3.5–5.0)
Alkaline Phosphatase: 130 U/L — ABNORMAL HIGH (ref 38–126)
Anion gap: 7 (ref 5–15)
BUN: 6 mg/dL — AB (ref 8–23)
CHLORIDE: 109 mmol/L (ref 98–111)
CO2: 24 mmol/L (ref 22–32)
Calcium: 8.8 mg/dL — ABNORMAL LOW (ref 8.9–10.3)
Creatinine, Ser: 0.58 mg/dL (ref 0.44–1.00)
Glucose, Bld: 95 mg/dL (ref 70–99)
POTASSIUM: 3.5 mmol/L (ref 3.5–5.1)
SODIUM: 140 mmol/L (ref 135–145)
Total Bilirubin: 3.1 mg/dL — ABNORMAL HIGH (ref 0.3–1.2)
Total Protein: 6.5 g/dL (ref 6.5–8.1)

## 2017-11-05 MED ORDER — HEPARIN SOD (PORK) LOCK FLUSH 100 UNIT/ML IV SOLN
500.0000 [IU] | Freq: Once | INTRAVENOUS | Status: AC | PRN
  Administered 2017-11-05: 500 [IU]
  Filled 2017-11-05: qty 5

## 2017-11-05 MED ORDER — SODIUM CHLORIDE 0.9 % IJ SOLN
10.0000 mL | INTRAMUSCULAR | Status: DC | PRN
  Administered 2017-11-05: 10 mL
  Filled 2017-11-05: qty 10

## 2017-11-06 LAB — CA 125: Cancer Antigen (CA) 125: 120.8 U/mL — ABNORMAL HIGH (ref 0.0–38.1)

## 2017-11-08 NOTE — Progress Notes (Signed)
[place]  [phone] [phone]    ID: [name]   DOB: [birth year]  MR#: [id number]  SUP#:[id number]  PCP: Ann Held, DO GYN:  SU: [facility] OTHER PF:[id number] [name], Shanon Ace, Roney Jaffe  CHIEF COMPLAINT:  Estrogen receptor positive Breast Cancer; Ovarian cancer; DVT  CURRENT TREATMENT: Letrozole  INTERVAL HISTORY: [name] returns today for follow-up and treatment of her estrogen receptor positive breast cancer and for follow-up of her ovarian cancer accomapanied by her husband. She continues on letrozole, with good tolerance. She denies issues with hot flashes or vaginal dryness.   Since her last visit, she underwent diagnostic unilateral left  breast mammography with CAD and tomography on 08/14/2017 at [place] showing: breast density category C. There was no evidence of malignancy.   She also completed a bone density on 08/14/2017 which showed a T score of -2.7 osteoporosis.  We also follow her Ca1 25: Results for Rhonda, [name] (MRN [id number]) as of 11/09/2017 14:05  Ref. Range 03/10/2017 08:27 04/28/2017 08:09 07/01/2017 09:37 09/02/2017 11:30 11/05/2017 09:38  Cancer Antigen (CA) 125 Latest Ref Range: 0.0 - 38.1 U/mL 153.1 (H) 236.3 (H) 160.3 (H) 144.3 (H) 120.8 (H)      REVIEW OF SYSTEMS: Athziry reports that she is trying to improve her walking. She is receiving hyaluronic acid shots in her knees under Dr. Maureen Ralphs. She also has physical therapy. She has lost some weight, and her appetite has not been good. She has decreased her sweets consumption. She is going to have a liver CT under Dr. Havery Moros on 11/20/2017. She denies unusual headaches, visual changes, nausea, vomiting, or dizziness. There has been no unusual cough, phlegm production, or pleurisy. This been no change in bowel or bladder habits. She denies unexplained fatigue or unexplained weight loss, bleeding, rash, or fever. A  detailed review of systems was otherwise stable.    BREAST CANCER HISTORY: From the original consult note:  [name] noted a mass in her right breast mid December 2013, and as it did not spontaneously resolve over a couple of weeks she brought it to her primary physician's attention. She was set up for diagnostic mammography and right breast ultrasonography at the breast Center 05/10/2012. (Note that the patient's most recent prior mammography had been in October 2008). The current study showed a spiculated mass in the superior subareolar portion of the right breast measuring approximately 5 cm and associated with pleomorphic calcifications. This was firm and palpable. There was right nipple retraction and skin thickening. Ultrasound confirmed an irregularly marginated hypoechoic mass measuring 3.5 cm by ultrasound, and an abnormal appearing lower right axillary lymph node measuring 2.6 cm.  Biopsies of both the breast mass and the abnormal appearing lymph node were performed 05/21/2012. Both showed an invasive ductal carcinoma, grade 2, with similar prognostic panels (the breast mass was 100% estrogen and 73% progesterone receptor positive, with an MIB-1 of 5%; the lymph node was 100% estrogen 100% progesterone receptor positive, with an MIB-1 of 20%). Both masses were HER-2 negative.  Breast MRI obtained at [facility] 05/29/2012 confirmed a dominant mass in the retroareolar right breast measuring 4.4 cm maximally. There was a satellite nodule inferior and lateral to this mass, measuring 1.7 cm. There were no other masses in either breast. Aside from the previously biopsied lymph node there were other mildly enhancing level I right axillary lymph nodes which did not appear pathologic. There was no other lymphadenopathy noted.  The patient's subsequent history is as detailed below.  OVARIAN CANCER HISTORY: From the 06/16/2014 summary note:  "[name] returns today for review of her restaging  studies accompanied by her husband Timmothy [name]. To summarize her recent history: She was admitted to [facility] with a diagnosis of possible pneumonia in late December 2015. Chest x-ray showed some increased density in the right upper lobe. CT scan was suggested and was obtained by Dr. Inda Castle. This showed 2 small nodules in the right middle lobe, measuring 8 and 4 mm respectively. Dr. Inda Castle then contacted [name] for further evaluation and we set Rhonda up for a PET scan and which was performed late January. This showed the 2 nodules in the lung not to be hypermetabolic. This of course does not prove that they are not malignant. More importantly, there was a 13.7 cm cystic/solid mass in the upper pelvis associated with adenopathy,. All of this was hypermetabolic."  The patient was evaluated by gynecologic oncology and on 06/27/2014 she underwent surgery under Dr. Denman George. This consisted of an exploratory laparotomy with hysterectomy and abdominal salpingo-oophorectomy, as well as tumor debulking and omentectomy. 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 The tumor measured 11.5 cm, focally involve the ovarian capsule on the left; the uterus and right ovary and fallopian tube were unremarkable. One para-aortic lymph node was positive out of a total of 6 lymph nodes sampled (2 left common iliac, 2 left para-aortic, and to within the omental resection).  Her subsequent history is as detailed below   PAST MEDICAL HISTORY: Past Medical History:  Diagnosis Date  . Allergy   . Anemia    low iron hx  . Arthritis   . Breast cancer ([place]) 2014   a. 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 Cataract    removed ou  . Chronic combined systolic and diastolic CHF (congestive heart failure) ([place])    a. 08/2012  Echo: EF 55-60%, no rwma, mod MR;  b. 07/2014 Echo: EF 45-50%, distal antsept HK, mod TR/MR, mildly bil-atrial enlargement.  . Clear Cell Ovarian Cancer    a. 06/2014 s/p TAH/BSO debulking/lymph node dissection;  b. Now on chemo.  . Depression   . Diabetes mellitus   . DJD (degenerative joint disease) of lumbar spine   . DVT (deep venous thrombosis) (H[facility])    hx of on HRT left leg ~2006  . Gallstones   . GERD (gastroesophageal reflux disease)   . Hyperlipidemia   . Hypertension   . Hypothyroidism   . Liver disease, chronic, with cirrhosis ([place])    ? autoimmune  . OSA on CPAP    cpap 4.5 setting  . Paroxysmal atrial fibrillation (H[facility])    a. CHA2DS2VASc = 4-->coumadin.  [name] Physical deconditioning   . PPD positive, treated    rx inh   . Sleep apnea     PAST SURGICAL HISTORY: Past Surgical History:  Procedure Laterality Date  . ABDOMINAL HYSTERECTOMY N/A 06/27/2014   Procedure: HYSTERECTOMY ABDOMINAL TOTAL;  Surgeon: Everitt Amber, MD;  Location: [facility];  Service: Gynecology;  Laterality: N/A;  . BREAST BIOPSY     left breast  . BREAST EXCISIONAL BIOPSY Left 2002  . CATARACT EXTRACTION    . CHOLECYSTECTOMY    . EYE SURGERY    . FOOT SURGERY    .  history of chemotherapy x 2 treatments, radiation tx  2014  . LAPAROTOMY N/A 06/27/2014   Procedure: EXPLORATORY LAPAROTOMY;  Surgeon: Everitt Amber, MD;  Location: [facility];  Service: Gynecology;  Laterality: N/A;  . MASTECTOMY Right 2014  . MASTECTOMY MODIFIED RADICAL  06/08/2012   Procedure: MASTECTOMY MODIFIED RADICAL;  Surgeon: Edward Jolly, MD;  Location: [facility];  Service: General;  Laterality: Right;  . pac removed  2015  . PEG TUBE PLACEMENT     peg removed 2015  . PERCUTANEOUS LIVER BIOPSY    . PORTACATH PLACEMENT  06/08/2012   Procedure: INSERTION PORT-A-CATH;  Surgeon: Edward Jolly, MD;  Location: [place];  Service: General;  Laterality: Left;  . SALPINGOOPHORECTOMY Bilateral 06/27/2014   Procedure: BILATERAL SALPINGO  OOPHORECTOMY/TUMOR DEBULKING/LYMPHNODE DISSECTION, OMENTECTOMY;  Surgeon: Everitt Amber, MD;  Location: [facility];  Service: Gynecology;  Laterality: Bilateral;  . TUBAL LIGATION      FAMILY HISTORY Family History  Problem Relation Age of Onset  . Diabetes Mother   . Hypertension Mother   . Arthritis Mother   . Heart disease Mother   . Heart failure Mother   . Other Mother        benign breast mass  . Stroke Father   . Heart disease Father   . Dementia Father   . Diabetes Paternal Grandmother   . Colon cancer Paternal Grandfather        dx. 70s-80s  . Colon cancer Maternal Grandfather   . Other Daughter        potentially has had negative BRCA testing  . Other Daughter        hysterectomy; potentially has had negative genetic testing  . Rectal cancer Neg Hx   . Esophageal cancer Neg Hx   . Liver cancer Neg Hx    the patient's father died in his 85s with a history of dementia. He had had prior strokes. The patient's mother died in her 29s, with a history of congestive heart failure. Chalonda had no brothers, one sister. There is no history of breast or ovarian cancer in the family.  GYNECOLOGIC HISTORY: Menarche age 55, first live birth age 71, she is GX P2, menopause approximately 15 years ago, on hormone replacement until 2010.  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 Her husband Timmothy [name] works for [employer]. Daughter Paul Half is a Physiological scientist and lives in [place]. Daughter Santiago Bur and her family (husband and 2 children aged 31 and 3 years) currently live with the patient. Michaeleen is a member of a [employer].   ADVANCED DIRECTIVES: Not in place  HEALTH MAINTENANCE: (Updated October 2014) Social History   Tobacco Use  . Smoking status: Former Smoker    Packs/day: 1.00    Years: 1.00    Pack years: 1.00  . Smokeless tobacco: Former Dance movement psychotherapist Use Topics  . Alcohol use: No  . Drug use: No    Comment: quit age 32 only smoked as a teen 1 year     Colonoscopy: Never  PAP: Does not recall  Bone density:  08/14/2017 showed a T score of -2.7 osteoporosis  Lipid panel:   Allergies  Allergen Reactions  . Olmesartan Medoxomil Cough  . Adhesive [Tape] Rash  . Tetracycline Hcl Other (See Comments)  . Venlafaxine Other (See Comments)    severe dry mouth    Current  Outpatient Medications  Medication Sig Dispense Refill  . acetaminophen (TYLENOL) 500 MG tablet Take 500 mg by mouth every 6 (six) hours as needed for moderate pain.    [name] ALPRAZolam (XANAX) 0.5 MG tablet TAKE 1 TABLET BY MOUTH TWO  TIMES DAILY AS NEEDED FOR  ANXIETY 180 tablet 0  . buPROPion (WELLBUTRIN XL) 150 MG 24 hr tablet TAKE 1 TABLET BY MOUTH  DAILY 90 tablet 1  . Continuous Blood Gluc Receiver (FREESTYLE LIBRE READER) DEVI Inject 1 Stick as directed daily. Use daily to check blood sugar. E11.9 1 Device 0  . escitalopram (LEXAPRO) 20 MG tablet TAKE 1 TABLET BY MOUTH  EVERY MORNING 90 tablet 1  . famotidine (PEPCID) 20 MG tablet Take 1 tablet (20 mg total) by mouth daily. 30 tablet 0  . furosemide (LASIX) 20 MG tablet TAKE 1 TABLET BY MOUTH  DAILY 90 tablet 0  . glimepiride (AMARYL) 2 MG tablet TAKE 1 TABLET BY MOUTH TWO  TIMES DAILY 180 tablet 0  . glucose blood (BAYER CONTOUR NEXT TEST) test strip Use as instructed to check pt's blood sugar twice a day.  DX E11.9 100 each 5  . glucose blood (ONETOUCH VERIO) test strip USE TO CHECK BLOOD SUGAR TWICE DAILY 100 each 1  . ketoconazole (NIZORAL) 2 % cream Apply 1 application topically daily. 15 g 3  . letrozole (FEMARA) 2.5 MG tablet TAKE 1 TABLET BY MOUTH  DAILY 90 tablet 4  . levothyroxine (SYNTHROID, LEVOTHROID) 150 MCG tablet TAKE 1 TABLET BY MOUTH  DAILY 90 tablet 0  . lidocaine-prilocaine (EMLA) cream Apply over port area 1-2 hours before chemotherapy 30 g 0  . metFORMIN (GLUCOPHAGE-XR) 500 MG 24 hr  tablet TAKE 2 TABLETS BY MOUTH  EVERY EVENING 180 tablet 1  . metroNIDAZOLE (METROGEL) 1 % gel Apply topically daily. 45 g 0  . nadolol (CORGARD) 40 MG tablet Take 1 tablet (40 mg total) by mouth daily. 90 tablet 3  . NONFORMULARY OR COMPOUNDED ITEM Cmp, lipid, hgba1c, tsh--- dx , diabetes II, htn, hyperlipidemia, hypothyroidism 1 each 0  . OLANZapine (ZYPREXA) 2.5 MG tablet TAKE 1 TABLET BY MOUTH  NIGHTLY AT BEDTIME 90 tablet 0  . omeprazole (PRILOSEC) 20 MG capsule Take 1 capsule (20 mg total) by mouth daily. Needs follow up 90 capsule 0  . ondansetron (ZOFRAN) 8 MG tablet Take 1 tablet (8 mg total) by mouth every 8 (eight) hours as needed for nausea or vomiting. 270 tablet 0  . ONETOUCH DELICA LANCETS 49E MISC USE TO CHECK BLOOD SUGAR TWICE DAILY 100 each 0  . potassium chloride (K-DUR,KLOR-CON) 10 MEQ tablet TAKE 1 TABLET BY MOUTH TWO  TIMES DAILY 180 tablet 0  . pravastatin (PRAVACHOL) 20 MG tablet TAKE 1 TABLET BY MOUTH AT  BEDTIME 90 tablet 1  . propranolol (INDERAL) 20 MG tablet TAKE 1 TABLET BY MOUTH TWO  TIMES DAILY 180 tablet 3  . sulfamethoxazole-trimethoprim (BACTRIM DS) 800-160 MG tablet Take 1 tablet by mouth 2 (two) times daily. 14 tablet 0   No current facility-administered medications for this visit.     OBJECTIVE: Middle-aged white woman who appears stated age  81:   11/09/17 1413  BP: (!) 131/59  Pulse: 82  Resp: 18  Temp: 98.3 F (36.8 C)  SpO2: 96%     Body mass index is 28.19 kg/m.    ECOG FS: 1 Filed Weights   11/09/17 1413  Weight: 169 lb 6.4 oz (76.8 kg)   Sclerae unicteric,  EOMs intact Oropharynx clear and moist No cervical or supraclavicular adenopathy Lungs no rales or rhonchi Heart regular rate and rhythm Abd soft, nontender, positive bowel sounds MSK no focal spinal tenderness, no upper extremity lymphedema Neuro: nonfocal, well oriented, appropriate affect Breasts: Status post right mastectomy with no evidence of disease recurrence.  Left  breast is unremarkable.  Both axillae are benign.   Lab Results  Component Value Date   WBC 3.1 (L) 11/05/2017   NEUTROABS 2.3 11/05/2017   HGB 11.5 (L) 11/05/2017   HCT 34.1 (L) 11/05/2017   MCV 103.8 (H) 11/05/2017   PLT 81 (L) 11/05/2017     Chemistry      Component Value Date/Time   NA 140 11/05/2017 0938   NA 140 04/28/2017 0809   K 3.5 11/05/2017 0938   K 3.4 (L) 04/28/2017 0809   CL 109 11/05/2017 0938   CL 104 10/29/2012 1058   CO2 24 11/05/2017 0938   CO2 22 04/28/2017 0809   BUN 6 (L) 11/05/2017 0938   BUN 6.5 (L) 04/28/2017 0809   CREATININE 0.58 11/05/2017 0938   CREATININE 0.6 04/28/2017 0809      Component Value Date/Time   CALCIUM 8.8 (L) 11/05/2017 0938   CALCIUM 8.4 04/28/2017 0809   ALKPHOS 130 (H) 11/05/2017 0938   ALKPHOS 143 04/28/2017 0809   AST 47 (H) 11/05/2017 0938   AST 53 (H) 04/28/2017 0809   ALT 24 11/05/2017 0938   ALT 21 04/28/2017 0809   BILITOT 3.1 (H) 11/05/2017 0938   BILITOT 2.96 (H) 04/28/2017 0809      STUDIES: Since her last visit, she underwent diagnostic unilateral left  breast mammography with CAD and tomography on 08/14/2017 at [place] [facility] showing: breast density category C. There was no evidence of malignancy.   She also completed a bone density on 08/14/2017 which showed a T score of -2.7 osteoporosis.  ASSESSMENT: 62 y.o.  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    (2)  treated in the adjuvant setting with docetaxel/ cyclophosphamide given every 3 weeks. There were multiple and severe complications, and the  patient tolerated only two cycles, last dose 07/29/2012  (3) letrozole started 08/16/2012  (a) osteopenia, with a T score of -1.14 April 2013  (b) bone density on 08/14/2017 showed  a T score of -2.7 osteoporosis  (4) postmastectomy radiation completed 12/24/2012  (5)  comorbidities include diabetes, hypertension, chronic liver disease with cirrhosis and fatty liver, and nutritional disturbance with temporarily dependence on PEG feeds (discontinued July 2014).  (6) restaging studies January 2016 show  (a) 8 mm and 4 mm Right middle lobe lung nodules, not metabolically active on PET   (b) hypermetabolic 84.6 cm mixed solid/cystic lesion in upper pelvis, with associated adenopathy (see #7)  OVARIAN CANCER (7) status post exploratory laparotomy 06/27/2014 with total abdominal hysterectomy, bilateral salpingo-oophorectomy, para-aortic lymphadenectomy, omentectomy and radical tumor debulking for a left-sided clear cell ovarian cancer, pT1c pN1, stage IIIC .  (8) adjuvant chemotherapy consisting of carboplatin and paclitaxel given weekly days 1 and 8 of each 21 day cycle, for 6-8 cycles as tolerated, started 07/17/2014, with onpro support  (a) day 8 cycle 2 omitted and further treatments cancelled because of an episode of Klebsiella sepsis requiring intensive care unit admission-- last  chemotherapy dose 03/26/2015  (9) bilateral lower extremity DVTs documented 09/25/2014 despite being on Coumadin for PAF (subtherapeutic INR) with mobile Right common femoral clot  (a) status post thrombolysis 09/30/2014  (b) on Lovenox daily as of 10/02/2014, stopped April 2017  (c) IVC filter placed 10/26/2014 as mobile clot still noted Right groin  (d) IVC filter removed 01/30/2015 with documented resolution of the movable thrombus 01/16/2015  (e) normal quantitative D-dimer 08/06/2015 and 09/24/2015  (10) started abraxane 10/13/2014, repeated every 14 days for 6 cycles (12 doses), completed 03/26/2015  (11) genetics testing December 04, 2014 through the Breast[facility]Ovarian Cancer Panel [facility] no deleterious mutations in ATM, BARD1, BRCA1, BRCA2, BRIP1, CDH1, CHEK2, FANCC,  MLH1, MSH2, MSH6, NBN, PALB2, PMS2, PTEN, RAD51C, RAD51D, STK11, TP53, and XRCC2.    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  This is very favorable.  She continues on letrozole and the plan is to do that for a total of 7 years.  She is now osteoporotic.  I would like to start her on denosumab/Prolia.  However she is planning to have a tooth pulled next week.  I have written out the information so that she may share this with her dentist.  When she returns to see me in January 2020 we will start Prolia at that time  As far as her ovarian cancer is concerned she is now a little over 3 years out from definitive surgery with no evidence of disease activity.  Her Ca1 25 continues to trend down.  We are going to continue to follow lab work on an every two-month basis at the same time as we flush her port.  Incidentally her insurance refuses to pay for the EMLA cream for reasons that are on known to her or to me.  She will appeal.  She will then see me again in 6 months.  At that time we will initiate her denosumab treatments.  She knows to call for any problems that may develop before the return visit.   Ansar Skoda, Virgie Dad, MD  11/09/17 2:28 PM Medical Oncology and Hematology [facility] [address] [place], [place] [address] Tel. [phone]    Fax. [phone]  Alice Rieger, am acting as scribe for Chauncey Cruel MD.  I, Lurline Del MD, have reviewed the above documentation for accuracy and completeness, and I agree with the above.

## 2017-11-09 ENCOUNTER — Telehealth: Payer: Self-pay

## 2017-11-09 ENCOUNTER — Inpatient Hospital Stay: Payer: Medicare Other | Attending: Oncology | Admitting: Oncology

## 2017-11-09 ENCOUNTER — Encounter: Payer: Medicare Other | Admitting: Adult Health

## 2017-11-09 ENCOUNTER — Telehealth: Payer: Self-pay | Admitting: Oncology

## 2017-11-09 VITALS — BP 131/59 | HR 82 | Temp 98.3°F | Resp 18 | Ht 65.0 in | Wt 169.4 lb

## 2017-11-09 DIAGNOSIS — C50811 Malignant neoplasm of overlapping sites of right female breast: Secondary | ICD-10-CM | POA: Diagnosis not present

## 2017-11-09 DIAGNOSIS — Z170 Estrogen receptor positive status [ER+]: Secondary | ICD-10-CM

## 2017-11-09 DIAGNOSIS — Z86718 Personal history of other venous thrombosis and embolism: Secondary | ICD-10-CM | POA: Diagnosis not present

## 2017-11-09 DIAGNOSIS — I10 Essential (primary) hypertension: Secondary | ICD-10-CM

## 2017-11-09 DIAGNOSIS — E119 Type 2 diabetes mellitus without complications: Secondary | ICD-10-CM | POA: Diagnosis not present

## 2017-11-09 DIAGNOSIS — M810 Age-related osteoporosis without current pathological fracture: Secondary | ICD-10-CM | POA: Insufficient documentation

## 2017-11-09 DIAGNOSIS — K760 Fatty (change of) liver, not elsewhere classified: Secondary | ICD-10-CM

## 2017-11-09 DIAGNOSIS — M818 Other osteoporosis without current pathological fracture: Secondary | ICD-10-CM

## 2017-11-09 DIAGNOSIS — Z87891 Personal history of nicotine dependence: Secondary | ICD-10-CM | POA: Diagnosis not present

## 2017-11-09 DIAGNOSIS — K7460 Unspecified cirrhosis of liver: Secondary | ICD-10-CM

## 2017-11-09 DIAGNOSIS — C772 Secondary and unspecified malignant neoplasm of intra-abdominal lymph nodes: Secondary | ICD-10-CM | POA: Diagnosis not present

## 2017-11-09 DIAGNOSIS — C562 Malignant neoplasm of left ovary: Secondary | ICD-10-CM

## 2017-11-09 MED ORDER — LIDOCAINE-PRILOCAINE 2.5-2.5 % EX CREA: 1.0000 "application " | TOPICAL_CREAM | CUTANEOUS | 0 refills | Status: DC | PRN

## 2017-11-09 NOTE — Telephone Encounter (Signed)
Called pt.  Ok to schedule any day next week but not Wednesday.   Schedule U/S for , July

## 2017-11-09 NOTE — Telephone Encounter (Signed)
-----   Message from Wyline Beady, [place] sent at 06/22/2017 11:24 AM EST ----- Patient needs [name] abd scheduled for cirrhosis. Orders have already been placed.

## 2017-11-09 NOTE — Telephone Encounter (Signed)
Gave patient AVS and Calendar.  °

## 2017-11-09 NOTE — Telephone Encounter (Signed)
Scheduled for Tues, July 9th at 11:30am. Arrive at 11:15am. NPO.  Spoke to husband. He confirmed appt.

## 2017-11-09 NOTE — Telephone Encounter (Signed)
From 06-22-17 OV:    patient was due for her 3rd and final Hep B during her appt on 06-22-17. However, Dr. Loni Muse noted she should have been getting Hep A also. So a Twinrix was given at 06-22-17 appt.  She will need her 2nd and final Hep A 6 months + 1 day from 06-22-17. Nurse appt made for December 21, 2017 at 10:30am.  Called and let pt know.

## 2017-11-10 ENCOUNTER — Ambulatory Visit: Payer: Medicare Other | Admitting: Oncology

## 2017-11-11 ENCOUNTER — Other Ambulatory Visit: Payer: Self-pay

## 2017-11-11 MED ORDER — LIDOCAINE-PRILOCAINE 2.5-2.5 % EX CREA: TOPICAL_CREAM | CUTANEOUS | 0 refills | Status: DC

## 2017-11-17 ENCOUNTER — Ambulatory Visit (HOSPITAL_COMMUNITY)
Admission: RE | Admit: 2017-11-17 | Discharge: 2017-11-17 | Disposition: A | Discharge status: Home / Self Care | Payer: Medicare Other | Source: Ambulatory Visit | Attending: Gastroenterology | Admitting: Gastroenterology

## 2017-11-17 DIAGNOSIS — R161 Splenomegaly, not elsewhere classified: Secondary | ICD-10-CM | POA: Insufficient documentation

## 2017-11-17 DIAGNOSIS — K7460 Unspecified cirrhosis of liver: Secondary | ICD-10-CM | POA: Insufficient documentation

## 2017-11-18 ENCOUNTER — Encounter: Payer: Self-pay | Admitting: *Deleted

## 2017-11-18 NOTE — Progress Notes (Signed)
ERROR

## 2017-12-01 ENCOUNTER — Other Ambulatory Visit: Payer: Self-pay | Admitting: Family Medicine

## 2017-12-01 ENCOUNTER — Other Ambulatory Visit: Payer: Self-pay | Admitting: Oncology

## 2017-12-01 DIAGNOSIS — K219 Gastro-esophageal reflux disease without esophagitis: Secondary | ICD-10-CM

## 2017-12-15 ENCOUNTER — Other Ambulatory Visit: Payer: Self-pay | Admitting: Family Medicine

## 2017-12-21 ENCOUNTER — Other Ambulatory Visit (INDEPENDENT_AMBULATORY_CARE_PROVIDER_SITE_OTHER): Payer: Medicare Other

## 2017-12-21 ENCOUNTER — Ambulatory Visit (INDEPENDENT_AMBULATORY_CARE_PROVIDER_SITE_OTHER): Payer: Medicare Other | Admitting: Gastroenterology

## 2017-12-21 DIAGNOSIS — Z23 Encounter for immunization: Secondary | ICD-10-CM | POA: Diagnosis not present

## 2017-12-21 DIAGNOSIS — K7460 Unspecified cirrhosis of liver: Secondary | ICD-10-CM

## 2017-12-21 LAB — PROTIME-INR
INR: 1.5 ratio — ABNORMAL HIGH (ref 0.8–1.0)
PROTHROMBIN TIME: 17.1 s — AB (ref 9.6–13.1)

## 2017-12-22 LAB — AFP TUMOR MARKER: AFP TUMOR MARKER: 3.5 ng/mL

## 2018-01-12 ENCOUNTER — Inpatient Hospital Stay: Payer: Medicare Other

## 2018-01-12 ENCOUNTER — Inpatient Hospital Stay: Payer: Medicare Other | Attending: Oncology

## 2018-01-12 DIAGNOSIS — C50811 Malignant neoplasm of overlapping sites of right female breast: Secondary | ICD-10-CM | POA: Diagnosis not present

## 2018-01-12 DIAGNOSIS — C562 Malignant neoplasm of left ovary: Secondary | ICD-10-CM | POA: Diagnosis present

## 2018-01-12 DIAGNOSIS — C569 Malignant neoplasm of unspecified ovary: Secondary | ICD-10-CM

## 2018-01-12 DIAGNOSIS — C772 Secondary and unspecified malignant neoplasm of intra-abdominal lymph nodes: Secondary | ICD-10-CM | POA: Insufficient documentation

## 2018-01-12 DIAGNOSIS — Z95828 Presence of other vascular implants and grafts: Secondary | ICD-10-CM

## 2018-01-12 LAB — CBC WITH DIFFERENTIAL/PLATELET
BASOS ABS: 0 10*3/uL (ref 0.0–0.1)
Basophils Relative: 0 %
Eosinophils Absolute: 0.1 10*3/uL (ref 0.0–0.5)
Eosinophils Relative: 2 %
HEMATOCRIT: 35.3 % (ref 34.8–46.6)
HEMOGLOBIN: 11.7 g/dL (ref 11.6–15.9)
Lymphocytes Relative: 24 %
Lymphs Abs: 0.8 10*3/uL — ABNORMAL LOW (ref 0.9–3.3)
MCH: 34.3 pg — ABNORMAL HIGH (ref 25.1–34.0)
MCHC: 33.1 g/dL (ref 31.5–36.0)
MCV: 103.5 fL — ABNORMAL HIGH (ref 79.5–101.0)
Monocytes Absolute: 0.3 10*3/uL (ref 0.1–0.9)
Monocytes Relative: 9 %
NEUTROS ABS: 2.2 10*3/uL (ref 1.5–6.5)
NEUTROS PCT: 65 %
Platelets: 92 10*3/uL — ABNORMAL LOW (ref 145–400)
RBC: 3.41 MIL/uL — AB (ref 3.70–5.45)
RDW: 15.4 % — ABNORMAL HIGH (ref 11.2–14.5)
WBC: 3.4 10*3/uL — AB (ref 3.9–10.3)

## 2018-01-12 LAB — COMPREHENSIVE METABOLIC PANEL
ALBUMIN: 3.1 g/dL — AB (ref 3.5–5.0)
ALK PHOS: 136 U/L — AB (ref 38–126)
ALT: 18 U/L (ref 0–44)
AST: 41 U/L (ref 15–41)
Anion gap: 10 (ref 5–15)
BILIRUBIN TOTAL: 2.8 mg/dL — AB (ref 0.3–1.2)
BUN: 7 mg/dL — AB (ref 8–23)
CALCIUM: 8.9 mg/dL (ref 8.9–10.3)
CO2: 23 mmol/L (ref 22–32)
Chloride: 108 mmol/L (ref 98–111)
Creatinine, Ser: 0.58 mg/dL (ref 0.44–1.00)
GFR calc Af Amer: >60 mL/min (ref >60)
GFR calc non Af Amer: >60 mL/min (ref >60)
GLUCOSE: 62 mg/dL — AB (ref 70–99)
Potassium: 3.6 mmol/L (ref 3.5–5.1)
SODIUM: 141 mmol/L (ref 135–145)
TOTAL PROTEIN: 6.8 g/dL (ref 6.5–8.1)

## 2018-01-12 MED ORDER — SODIUM CHLORIDE 0.9% FLUSH
10.0000 mL | INTRAVENOUS | Status: DC | PRN
  Administered 2018-01-12: 10 mL via INTRAVENOUS
  Filled 2018-01-12: qty 10

## 2018-01-12 MED ORDER — HEPARIN SOD (PORK) LOCK FLUSH 100 UNIT/ML IV SOLN
500.0000 [IU] | Freq: Once | INTRAVENOUS | Status: AC
  Administered 2018-01-12: 500 [IU] via INTRAVENOUS
  Filled 2018-01-12: qty 5

## 2018-01-12 NOTE — Patient Instructions (Signed)

## 2018-01-13 ENCOUNTER — Telehealth: Payer: Self-pay | Admitting: Oncology

## 2018-01-13 LAB — CA 125: Cancer Antigen (CA) 125: 160 U/mL — ABNORMAL HIGH (ref 0.0–38.1)

## 2018-01-13 NOTE — Telephone Encounter (Signed)
Per 9/4 sch msg. Called patient regarding adding appts for lab on 10/15.

## 2018-01-20 ENCOUNTER — Ambulatory Visit (INDEPENDENT_AMBULATORY_CARE_PROVIDER_SITE_OTHER): Payer: Medicare Other | Admitting: Gastroenterology

## 2018-01-20 ENCOUNTER — Encounter: Payer: Self-pay | Admitting: Gastroenterology

## 2018-01-20 ENCOUNTER — Ambulatory Visit: Payer: Medicare Other | Admitting: Gastroenterology

## 2018-01-20 VITALS — BP 110/60 | HR 84 | Ht 65.0 in | Wt 160.5 lb

## 2018-01-20 DIAGNOSIS — K7460 Unspecified cirrhosis of liver: Secondary | ICD-10-CM

## 2018-01-20 DIAGNOSIS — R634 Abnormal weight loss: Secondary | ICD-10-CM | POA: Diagnosis not present

## 2018-01-20 NOTE — Progress Notes (Signed)
HPI :  62 year old female here for a follow-up visit. She has a history of breast cancer / ovarian cancer and had been on chemotherapy in the past.   Seen remotely by Dr. Olevia Perches for fatty liver and she underwent a liver biopsy in 2010 which showed cirrhosis - description shows steatosis and I suspect NASH, although ANA was markedly positive back then but histology not consistent with autoimmune hepatitis. Smooth muscle antibody is negative as well as her immunoglobulin level.  She was vaccinated for hepatitis A and B previously. She has been treated with propranolol (switched from metoprolol) so she does not undergo screening for varices. She has declined in the past, as well as colonoscopy for screening purposes, she does not wish to have any colonoscopy exams.   She has been taking Lasix 20 mg once daily. She denies any lower extremity edema or ascites. She's not had any issues with encephalopathy. She has very limited ambulation due to knee pain, uses a walker or wheelchair for ambulation. She does have a history of taking Xanax once twice a day for anxiety. Husband does report she sleeps a lot during the day. Otherwise her main complaint is significant weight loss. Last year she weighed 208 pounds at her visit in July. 6 months ago she weighed 181 pounds. Today she weighs about 160 pounds. Weight loss is unintentional. She denies any nausea or vomiting, states she is eating okay. She denies any change in her bowel habits. No blood in her stools. She does have some decreased appetite at times.  [name] 11/17/17 - stable cirrhosis, trace ascites MRI LIVER 12/08/2016 - regenerating nodule, cirrhosis  Past Medical History:  Diagnosis Date  . Allergy   . Anemia    low iron hx  . Arthritis   . Breast cancer ([place]) 2014   a. 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 Cataract    removed ou  .  Chronic combined systolic and diastolic CHF (congestive heart failure) ([place])    a. 08/2012 Echo: EF 55-60%, no rwma, mod MR;  b. 07/2014 Echo: EF 45-50%, distal antsept HK, mod TR/MR, mildly bil-atrial enlargement.  . Clear Cell Ovarian Cancer    a. 06/2014 s/p TAH/BSO debulking/lymph node dissection;  b. Now on chemo.  . Depression   . Diabetes mellitus   . DJD (degenerative joint disease) of lumbar spine   . DVT (deep venous thrombosis) ([facility])    hx of on HRT left leg ~2006  . Gallstones   . GERD (gastroesophageal reflux disease)   . Hyperlipidemia   . Hypertension   . Hypothyroidism   . Liver disease, chronic, with cirrhosis ([place])    ? autoimmune  . OSA on CPAP    cpap 4.5 setting  . Paroxysmal atrial fibrillation ([facility])    a. CHA2DS2VASc = 4-->coumadin.  [name] Physical deconditioning   . PPD positive, treated    rx inh   . Sleep apnea      Past Surgical History:  Procedure Laterality Date  . ABDOMINAL HYSTERECTOMY N/A 06/27/2014   Procedure: HYSTERECTOMY ABDOMINAL TOTAL;  Surgeon: Everitt Amber, MD;  Location: [facility];  Service: Gynecology;  Laterality: N/A;  . BREAST BIOPSY     left breast  . BREAST EXCISIONAL BIOPSY Left 2002  . CATARACT EXTRACTION    . CHOLECYSTECTOMY    . EYE SURGERY    . FOOT SURGERY    .  history of chemotherapy x 2 treatments, radiation tx  2014  . LAPAROTOMY N/A 06/27/2014   Procedure: EXPLORATORY LAPAROTOMY;  Surgeon: Everitt Amber, MD;  Location: [facility];  Service: Gynecology;  Laterality: N/A;  . MASTECTOMY Right 2014  . MASTECTOMY MODIFIED RADICAL  06/08/2012   Procedure: MASTECTOMY MODIFIED RADICAL;  Surgeon: Edward Jolly, MD;  Location: [facility];  Service: General;  Laterality: Right;  . pac removed  2015  . PEG TUBE PLACEMENT     peg removed 2015  . PERCUTANEOUS LIVER BIOPSY    . PORTACATH PLACEMENT  06/08/2012   Procedure: INSERTION PORT-A-CATH;  Surgeon: Edward Jolly, MD;  Location: [facility];  Service: General;  Laterality: Left;  .  SALPINGOOPHORECTOMY Bilateral 06/27/2014   Procedure: BILATERAL SALPINGO OOPHORECTOMY/TUMOR DEBULKING/LYMPHNODE DISSECTION, OMENTECTOMY;  Surgeon: Everitt Amber, MD;  Location: [facility];  Service: Gynecology;  Laterality: Bilateral;  . TUBAL LIGATION     Family History  Problem Relation Age of Onset  . Diabetes Mother   . Hypertension Mother   . Arthritis Mother   . Heart disease Mother   . Heart failure Mother   . Other Mother        benign breast mass  . Stroke Father   . Heart disease Father   . Dementia Father   . Diabetes Paternal Grandmother   . Colon cancer Paternal Grandfather        dx. 70s-80s  . Colon cancer Maternal Grandfather   . Other Daughter        potentially has had negative BRCA testing  . Other Daughter        hysterectomy; potentially has had negative genetic testing  . Rectal cancer Neg Hx   . Esophageal cancer Neg Hx   . Liver cancer Neg Hx    Social History   Tobacco Use  . Smoking status: Former Smoker    Packs/day: 1.00    Years: 1.00    Pack years: 1.00  . Smokeless tobacco: Former Network engineer Use Topics  . Alcohol use: No  . Drug use: No    Comment: quit age 6 only smoked as a teen 1 year   Current Outpatient Medications  Medication Sig Dispense Refill  . acetaminophen (TYLENOL) 500 MG tablet Take 500 mg by mouth every 6 (six) hours as needed for moderate pain.    [name] ALPRAZolam (XANAX) 0.5 MG tablet TAKE 1 TABLET BY MOUTH TWO  TIMES DAILY AS NEEDED FOR  ANXIETY 180 tablet 0  . buPROPion (WELLBUTRIN XL) 150 MG 24 hr tablet TAKE 1 TABLET BY MOUTH  DAILY 90 tablet 1  . Continuous Blood Gluc Receiver (FREESTYLE LIBRE READER) DEVI Inject 1 Stick as directed daily. Use daily to check blood sugar. E11.9 1 Device 0  . escitalopram (LEXAPRO) 20 MG tablet TAKE 1 TABLET BY MOUTH  EVERY MORNING 90 tablet 1  . famotidine (PEPCID) 20 MG tablet Take 1 tablet (20 mg total) by mouth daily. 30 tablet 0  . furosemide (LASIX) 20 MG tablet TAKE 1 TABLET BY MOUTH   DAILY 90 tablet 0  . glimepiride (AMARYL) 2 MG tablet TAKE 1 TABLET BY MOUTH TWO  TIMES DAILY 180 tablet 0  . glucose blood (BAYER CONTOUR NEXT TEST) test strip Use as instructed to check pt's blood sugar twice a day.  DX E11.9 100 each 5  . glucose blood (ONETOUCH VERIO) test strip USE TO CHECK BLOOD SUGAR TWICE DAILY 100 each 1  . ketoconazole (NIZORAL) 2 %  cream Apply 1 application topically daily. 15 g 3  . letrozole (FEMARA) 2.5 MG tablet TAKE 1 TABLET BY MOUTH  DAILY 90 tablet 4  . levothyroxine (SYNTHROID, LEVOTHROID) 150 MCG tablet TAKE 1 TABLET BY MOUTH  DAILY 90 tablet 0  . lidocaine-prilocaine (EMLA) cream Apply 1 application topically as needed. 30 g 0  . metFORMIN (GLUCOPHAGE-XR) 500 MG 24 hr tablet TAKE 2 TABLETS BY MOUTH  EVERY EVENING 180 tablet 1  . metroNIDAZOLE (METROGEL) 1 % gel Apply topically daily. 45 g 0  . nadolol (CORGARD) 40 MG tablet Take 1 tablet (40 mg total) by mouth daily. 90 tablet 3  . NONFORMULARY OR COMPOUNDED ITEM Cmp, lipid, hgba1c, tsh--- dx , diabetes II, htn, hyperlipidemia, hypothyroidism 1 each 0  . OLANZapine (ZYPREXA) 2.5 MG tablet TAKE 1 TABLET BY MOUTH  NIGHTLY AT BEDTIME 90 tablet 0  . omeprazole (PRILOSEC) 20 MG capsule Take 1 capsule (20 mg total) by mouth daily. 90 capsule 0  . ondansetron (ZOFRAN) 8 MG tablet Take 1 tablet (8 mg total) by mouth every 8 (eight) hours as needed for nausea or vomiting. 270 tablet 0  . ONETOUCH DELICA LANCETS 19J MISC USE TO CHECK BLOOD SUGAR TWICE DAILY 100 each 0  . potassium chloride (K-DUR,KLOR-CON) 10 MEQ tablet TAKE 1 TABLET BY MOUTH TWO  TIMES DAILY 180 tablet 0  . pravastatin (PRAVACHOL) 20 MG tablet TAKE 1 TABLET BY MOUTH AT  BEDTIME 90 tablet 1  . propranolol (INDERAL) 20 MG tablet TAKE 1 TABLET BY MOUTH TWO  TIMES DAILY 180 tablet 3   No current facility-administered medications for this visit.    Allergies  Allergen Reactions  . Olmesartan Medoxomil Cough  . Adhesive [Tape] Rash  . Tetracycline  Hcl Other (See Comments)  . Venlafaxine Other (See Comments)    severe dry mouth     Review of Systems: All systems reviewed and negative except where noted in HPI.   Lab Results  Component Value Date   WBC 3.4 (L) 01/12/2018   HGB 11.7 01/12/2018   HCT 35.3 01/12/2018   MCV 103.5 (H) 01/12/2018   PLT 92 (L) 01/12/2018    Lab Results  Component Value Date   CREATININE 0.58 01/12/2018   BUN 7 (L) 01/12/2018   NA 141 01/12/2018   K 3.6 01/12/2018   CL 108 01/12/2018   CO2 23 01/12/2018    Lab Results  Component Value Date   ALT 18 01/12/2018   AST 41 01/12/2018   ALKPHOS 136 (H) 01/12/2018   BILITOT 2.8 (H) 01/12/2018   Lab Results  Component Value Date   INR 1.5 (H) 12/21/2017   INR 1.5 (H) 07/01/2017   INR 1.6 (H) 02/06/2017   PROTIME 14.4 (H) 11/03/2014   PROTIME 14.4 (H) 10/13/2014   PROTIME 14.4 (H) 10/06/2014     Physical Exam: BP 110/60 (BP Location: Left Arm, Patient Position: Sitting, Cuff Size: Normal)   Pulse 84   Ht _0  (1.651 m)   Wt 160 lb 8 oz (72.8 kg)   BMI 26.71 kg/m  Constitutional: Pleasant, female in no acute distress, in wheelchair HEENT: Normocephalic and atraumatic. Conjunctivae are normal. No scleral icterus. Neck supple.  Cardiovascular: Normal rate, regular rhythm.  Pulmonary/chest: Effort normal and breath sounds normal. No wheezing, rales or rhonchi. Abdominal: Soft, nondistended, nontender.  There are no masses palpable. No hepatomegaly. Extremities: no edema Lymphadenopathy: No cervical adenopathy noted. Neurological: Alert and oriented to person place and time. No asterixis Skin:  Skin is warm and dry. No rashes noted. Psychiatric: Normal mood and affect. Behavior is normal.   ASSESSMENT AND PLAN: 62 year old female here for assessment of the following issues:   Cirrhosis / weight loss - suspected to be related to fatty liver or Karlene Lineman. She has been compensated over time, however given her INR and bilirubin her MELD is  15 today. We discussed her cirrhosis, risk for further decompensation and HCC. She is on propranolol, not pursuing EGD, she wants to avoid any endoscopic evaluation. I'm most concerned about her weight loss that she endorses today, close to 50 pounds over the past year. While her ultrasound shows no evidence of [place] her last cross sectional imaging was about 10 months ago. Given her history of breast and ovarian cancer, recommend CT scan of the chest abdomen pelvis with contrast to further evaluate the weight loss. She was in agreement with this. We'll await her imaging initially with further recommendations. Otherwise we'll continue to see her every 6 months for her cirrhosis. May consider hepatology evaluation based on her course if MELD remains at current value and no evidence of malignancy on CT scan.   Colon cancer screening - as above, she declines colon cancer screening which has been recommended for her on multiple occasions.  Peck Cellar, MD [facility]

## 2018-01-20 NOTE — Patient Instructions (Signed)
You have been scheduled for a CT scan of the chest,and abdomen and pelvis at [facility] ([address]---this is in the same building as Press [facility]).   You are scheduled on 01/29/18 at 1pm. You should arrive 15 minutes prior to your appointment time for registration. Please follow the written instructions below on the day of your exam:  WARNING: IF YOU ARE ALLERGIC TO IODINE/X-RAY DYE, PLEASE NOTIFY RADIOLOGY IMMEDIATELY AT [phone]! YOU WILL BE GIVEN A 13 HOUR PREMEDICATION PREP.  1) Do not eat or drink anything after 9am (4 hours prior to your test) 2) You have been given 2 bottles of oral contrast to drink. The solution may taste better if refrigerated, but do NOT add ice or any other liquid to this solution. Shake well before drinking.    Drink 1 bottle of contrast @ 11am (2 hours prior to your exam)  Drink 1 bottle of contrast @ 12pm (1 hour prior to your exam)  You may take any medications as prescribed with a small amount of water except for the following: Metformin, Glucophage, Glucovance, Avandamet, Riomet, Fortamet, Actoplus Met, Janumet, Glumetza or Metaglip. The above medications must be held the day of the exam AND 48 hours after the exam.  The purpose of you drinking the oral contrast is to aid in the visualization of your intestinal tract. The contrast solution may cause some diarrhea. Before your exam is started, you will be given a small amount of fluid to drink. Depending on your individual set of symptoms, you may also receive an intravenous injection of x-ray contrast/dye. Plan on being at [facility] for 30 minutes or longer, depending on the type of exam you are having performed.  This test typically takes 30-45 minutes to complete.  If you have any questions regarding your exam or if you need to reschedule, you may call the CT department at [phone] between the hours of 8:00 am and 5:00 pm, Monday-Friday.  Normal BMI (Body Mass Index-  based on height and weight) is between 19 and 25. Your BMI today is Body mass index is 26.71 kg/m. [name] Please consider follow up  regarding your BMI with your Primary Care Provider.  Please follow up with Dr Havery Moros in March 2020.   ________________________________________________________________________

## 2018-01-26 ENCOUNTER — Other Ambulatory Visit: Payer: Self-pay | Admitting: Family Medicine

## 2018-01-28 ENCOUNTER — Ambulatory Visit: Payer: Medicare Other | Admitting: *Deleted

## 2018-01-28 ENCOUNTER — Encounter: Payer: Medicare Other | Admitting: Family Medicine

## 2018-01-29 ENCOUNTER — Inpatient Hospital Stay: Admission: RE | Admit: 2018-01-29 | Payer: Medicare Other | Source: Ambulatory Visit

## 2018-01-29 ENCOUNTER — Other Ambulatory Visit: Payer: Medicare Other

## 2018-02-02 ENCOUNTER — Other Ambulatory Visit: Payer: Self-pay | Admitting: Family Medicine

## 2018-02-02 ENCOUNTER — Other Ambulatory Visit: Payer: Self-pay | Admitting: Oncology

## 2018-02-02 DIAGNOSIS — I10 Essential (primary) hypertension: Secondary | ICD-10-CM

## 2018-02-02 DIAGNOSIS — K219 Gastro-esophageal reflux disease without esophagitis: Secondary | ICD-10-CM

## 2018-02-13 ENCOUNTER — Other Ambulatory Visit: Payer: Self-pay | Admitting: Oncology

## 2018-02-19 ENCOUNTER — Telehealth: Payer: Self-pay | Admitting: *Deleted

## 2018-02-19 NOTE — Telephone Encounter (Signed)
VM left by the patient's husband, [name], requesting a return call.   This RN returned call and obtained identified VM - message left call being returned and need for him to call again.

## 2018-02-19 NOTE — Telephone Encounter (Signed)
[name] returned call to this RN - his inquiry was to " why does [name] need another lab next week did they find something - cause I am worried about her - she has lost a lot of weight this last year "  This RN informed [name] MD need to recheck CA 125 due to noted fluctuation.  No further needs at this time.

## 2018-02-23 ENCOUNTER — Inpatient Hospital Stay: Payer: Medicare Other | Attending: Oncology

## 2018-02-23 ENCOUNTER — Inpatient Hospital Stay: Payer: Medicare Other

## 2018-02-23 DIAGNOSIS — C50811 Malignant neoplasm of overlapping sites of right female breast: Secondary | ICD-10-CM

## 2018-02-23 DIAGNOSIS — C569 Malignant neoplasm of unspecified ovary: Secondary | ICD-10-CM | POA: Diagnosis not present

## 2018-02-23 DIAGNOSIS — C562 Malignant neoplasm of left ovary: Secondary | ICD-10-CM

## 2018-02-23 DIAGNOSIS — C50911 Malignant neoplasm of unspecified site of right female breast: Secondary | ICD-10-CM | POA: Diagnosis not present

## 2018-02-23 DIAGNOSIS — Z95828 Presence of other vascular implants and grafts: Secondary | ICD-10-CM

## 2018-02-23 LAB — CBC WITH DIFFERENTIAL/PLATELET
ABS IMMATURE GRANULOCYTES: 0.01 10*3/uL (ref 0.00–0.07)
BASOS ABS: 0 10*3/uL (ref 0.0–0.1)
Basophils Relative: 1 %
Eosinophils Absolute: 0.1 10*3/uL (ref 0.0–0.5)
Eosinophils Relative: 2 %
HCT: 34.2 % — ABNORMAL LOW (ref 36.0–46.0)
HEMOGLOBIN: 11.4 g/dL — AB (ref 12.0–15.0)
Immature Granulocytes: 0 %
LYMPHS PCT: 18 %
Lymphs Abs: 0.8 10*3/uL (ref 0.7–4.0)
MCH: 34.2 pg — ABNORMAL HIGH (ref 26.0–34.0)
MCHC: 33.3 g/dL (ref 30.0–36.0)
MCV: 102.7 fL — ABNORMAL HIGH (ref 80.0–100.0)
Monocytes Absolute: 0.4 10*3/uL (ref 0.1–1.0)
Monocytes Relative: 9 %
NRBC: 0 % (ref 0.0–0.2)
Neutro Abs: 3 10*3/uL (ref 1.7–7.7)
Neutrophils Relative %: 70 %
Platelets: 84 10*3/uL — ABNORMAL LOW (ref 150–400)
RBC: 3.33 MIL/uL — AB (ref 3.87–5.11)
RDW: 15.6 % — ABNORMAL HIGH (ref 11.5–15.5)
WBC: 4.3 10*3/uL (ref 4.0–10.5)

## 2018-02-23 LAB — COMPREHENSIVE METABOLIC PANEL
ALT: 24 U/L (ref 0–44)
ANION GAP: 7 (ref 5–15)
AST: 41 U/L (ref 15–41)
Albumin: 2.9 g/dL — ABNORMAL LOW (ref 3.5–5.0)
Alkaline Phosphatase: 130 U/L — ABNORMAL HIGH (ref 38–126)
BUN: 8 mg/dL (ref 8–23)
CHLORIDE: 111 mmol/L (ref 98–111)
CO2: 24 mmol/L (ref 22–32)
Calcium: 9 mg/dL (ref 8.9–10.3)
Creatinine, Ser: 0.56 mg/dL (ref 0.44–1.00)
Glucose, Bld: 212 mg/dL — ABNORMAL HIGH (ref 70–99)
Potassium: 3.4 mmol/L — ABNORMAL LOW (ref 3.5–5.1)
Sodium: 142 mmol/L (ref 135–145)
Total Bilirubin: 3.1 mg/dL — ABNORMAL HIGH (ref 0.3–1.2)
Total Protein: 6.5 g/dL (ref 6.5–8.1)

## 2018-02-23 MED ORDER — HEPARIN SOD (PORK) LOCK FLUSH 100 UNIT/ML IV SOLN
500.0000 [IU] | Freq: Once | INTRAVENOUS | Status: AC
  Administered 2018-02-23: 500 [IU] via INTRAVENOUS
  Filled 2018-02-23: qty 5

## 2018-02-23 MED ORDER — SODIUM CHLORIDE 0.9% FLUSH
10.0000 mL | INTRAVENOUS | Status: DC | PRN
  Administered 2018-02-23: 10 mL via INTRAVENOUS
  Filled 2018-02-23: qty 10

## 2018-02-24 LAB — CA 125: Cancer Antigen (CA) 125: 181 U/mL — ABNORMAL HIGH (ref 0.0–38.1)

## 2018-03-08 ENCOUNTER — Ambulatory Visit (INDEPENDENT_AMBULATORY_CARE_PROVIDER_SITE_OTHER): Payer: Medicare Other

## 2018-03-08 DIAGNOSIS — Z23 Encounter for immunization: Secondary | ICD-10-CM | POA: Diagnosis not present

## 2018-03-09 ENCOUNTER — Other Ambulatory Visit: Payer: Self-pay | Admitting: Oncology

## 2018-03-09 NOTE — Progress Notes (Unsigned)
Capricia has lost a fair amount of weight and her tumor markers are rising.  Dr. Havery Moros had ordered CT scans of the chest abdomen and pelvis in September but they have not yet been scheduled.  I have requested that our schedulers to facilitate this.

## 2018-03-11 ENCOUNTER — Other Ambulatory Visit: Payer: Self-pay | Admitting: *Deleted

## 2018-03-11 ENCOUNTER — Telehealth: Payer: Self-pay | Admitting: *Deleted

## 2018-03-11 NOTE — Telephone Encounter (Signed)
Note brought from scheduling sent by Dr Jana Hakim requesting to assist with scheduling CT of CAP asap - noted ordered by Dr Havery Moros to be done at [facility].  Central scheduling does not schedule for [place] nor can veiw their schedule.  Best to call Dr Havery Moros to follow up if scan has been scheduled.

## 2018-03-12 ENCOUNTER — Telehealth: Payer: Self-pay | Admitting: Gastroenterology

## 2018-03-12 ENCOUNTER — Telehealth: Payer: Self-pay

## 2018-03-12 ENCOUNTER — Other Ambulatory Visit: Payer: Self-pay

## 2018-03-12 NOTE — Telephone Encounter (Signed)
VM received form [name] at [facility] stating that she has spoken with pt's husband today and gave him the number to call and schedule CT scans.

## 2018-03-12 NOTE — Telephone Encounter (Signed)
Elmyra Ricks at PCP office called requesting pt ct scan to be r/s asap as PCP needs results. Pls call Elmyra Ricks at [phone].

## 2018-03-12 NOTE — Telephone Encounter (Signed)
Left message for Elmyra Ricks that patient's husband had already called to set this CT back up.

## 2018-03-12 NOTE — Telephone Encounter (Signed)
Left message for [name] at Dr Magrinat's office that it appears patient was originally scheduled for CT scan on 01/29/18 but cancelled. She has rescheduled to 03/31/18 at 930 am. I advised that should patient need to be moved up sooner, she is free to do so (patient has number and number provided to Dr Virgie Dad nurse as well). Also, if we need to expedite CT, we are glad to try that as well but it seems the issue is more of when the patient is able to come rather than on availability.

## 2018-03-12 NOTE — Telephone Encounter (Signed)
Spoke to patient's husband and gave him the number to [facility] CT scheduling. I do see where that order is still in the system.

## 2018-03-15 ENCOUNTER — Telehealth: Payer: Self-pay | Admitting: Medical Oncology

## 2018-03-15 ENCOUNTER — Inpatient Hospital Stay: Payer: Medicare Other | Attending: Oncology

## 2018-03-15 ENCOUNTER — Inpatient Hospital Stay: Payer: Medicare Other

## 2018-03-15 DIAGNOSIS — C569 Malignant neoplasm of unspecified ovary: Secondary | ICD-10-CM

## 2018-03-15 DIAGNOSIS — C50911 Malignant neoplasm of unspecified site of right female breast: Secondary | ICD-10-CM | POA: Insufficient documentation

## 2018-03-15 DIAGNOSIS — C562 Malignant neoplasm of left ovary: Secondary | ICD-10-CM

## 2018-03-15 DIAGNOSIS — C50811 Malignant neoplasm of overlapping sites of right female breast: Secondary | ICD-10-CM

## 2018-03-15 DIAGNOSIS — Z95828 Presence of other vascular implants and grafts: Secondary | ICD-10-CM

## 2018-03-15 DIAGNOSIS — M818 Other osteoporosis without current pathological fracture: Secondary | ICD-10-CM

## 2018-03-15 LAB — CBC WITH DIFFERENTIAL/PLATELET
ABS IMMATURE GRANULOCYTES: 0.01 10*3/uL (ref 0.00–0.07)
BASOS PCT: 0 %
Basophils Absolute: 0 10*3/uL (ref 0.0–0.1)
Eosinophils Absolute: 0.1 10*3/uL (ref 0.0–0.5)
Eosinophils Relative: 2 %
HCT: 31.9 % — ABNORMAL LOW (ref 36.0–46.0)
Hemoglobin: 10.4 g/dL — ABNORMAL LOW (ref 12.0–15.0)
IMMATURE GRANULOCYTES: 0 %
LYMPHS ABS: 0.7 10*3/uL (ref 0.7–4.0)
Lymphocytes Relative: 23 %
MCH: 33.8 pg (ref 26.0–34.0)
MCHC: 32.6 g/dL (ref 30.0–36.0)
MCV: 103.6 fL — AB (ref 80.0–100.0)
MONOS PCT: 7 %
Monocytes Absolute: 0.2 10*3/uL (ref 0.1–1.0)
NEUTROS ABS: 2.1 10*3/uL (ref 1.7–7.7)
NEUTROS PCT: 68 %
PLATELETS: 82 10*3/uL — AB (ref 150–400)
RBC: 3.08 MIL/uL — ABNORMAL LOW (ref 3.87–5.11)
RDW: 15.4 % (ref 11.5–15.5)
WBC: 3.1 10*3/uL — ABNORMAL LOW (ref 4.0–10.5)
nRBC: 0 % (ref 0.0–0.2)

## 2018-03-15 LAB — COMPREHENSIVE METABOLIC PANEL
ALBUMIN: 2.6 g/dL — AB (ref 3.5–5.0)
ALT: 21 U/L (ref 0–44)
ANION GAP: 10 (ref 5–15)
AST: 38 U/L (ref 15–41)
Alkaline Phosphatase: 125 U/L (ref 38–126)
BUN: 7 mg/dL — ABNORMAL LOW (ref 8–23)
CHLORIDE: 109 mmol/L (ref 98–111)
CO2: 22 mmol/L (ref 22–32)
Calcium: 8.3 mg/dL — ABNORMAL LOW (ref 8.9–10.3)
Creatinine, Ser: 0.62 mg/dL (ref 0.44–1.00)
GFR calc Af Amer: >60 mL/min (ref >60)
GFR calc non Af Amer: >60 mL/min (ref >60)
GLUCOSE: 145 mg/dL — AB (ref 70–99)
Potassium: 3.3 mmol/L — ABNORMAL LOW (ref 3.5–5.1)
Sodium: 141 mmol/L (ref 135–145)
Total Bilirubin: 2.3 mg/dL — ABNORMAL HIGH (ref 0.3–1.2)
Total Protein: 6 g/dL — ABNORMAL LOW (ref 6.5–8.1)

## 2018-03-15 MED ORDER — HEPARIN SOD (PORK) LOCK FLUSH 100 UNIT/ML IV SOLN
500.0000 [IU] | Freq: Once | INTRAVENOUS | Status: AC
  Administered 2018-03-15: 500 [IU]
  Filled 2018-03-15: qty 5

## 2018-03-15 MED ORDER — SODIUM CHLORIDE 0.9% FLUSH
10.0000 mL | Freq: Once | INTRAVENOUS | Status: AC
  Administered 2018-03-15: 10 mL
  Filled 2018-03-15: qty 10

## 2018-03-15 NOTE — Telephone Encounter (Signed)
CT scan-Per Dottie - at [facility] cancelled Ct scan for sept 20. R./S for nov 20. If it needs to be earlier she can expedite appt.

## 2018-03-15 NOTE — Telephone Encounter (Signed)
Ct scan moved up to 03/18/18.  Husband called and concerned about reason for moving up CT scan. I told him pt tumor marker (02/23/18) was higher and Dr Jana Hakim wants CT scan sooner.  (CA-125 drawn today) . Message to Val.

## 2018-03-15 NOTE — Telephone Encounter (Signed)
Rising tumor marker-Move up CT scan-Per Dr Jana Hakim CT scan needs to be sooner -pt has rising tumor markers. Left message for Dottie to schedule Ct scan soon.  Pt here today for labs including CA-125.

## 2018-03-15 NOTE — Telephone Encounter (Signed)
Patient has been rescheduled for CT chest, abd/pelvis on 03/18/18 at 12:00 pm at [facility] CT at the request of Dr Jana Hakim. I have called patient home number and spoken with husband (caregiver- ok to speak with per ROI). He verbalizes understanding of time/date/location and prep for ct. He also indicates that he still has contrast available from when scan was originally scheduled back in September. [name] seems very worried and wants to know why we are moving scan up so soon. I advised that Dr Magrinat's office wanted scan moved up due to some elevated tumor markers. Just want to make sure no changes are seen on scan that would make markers rise.  I have left message on Nicole's voicemail at Dr Magrinat's office advising of this information as well as the fact that patient/spouse seem to be worried and confused about current situation with rising tumor markers and they may need to be reeducated regaring what is going on.

## 2018-03-16 LAB — CA 125: Cancer Antigen (CA) 125: 137 U/mL — ABNORMAL HIGH (ref 0.0–38.1)

## 2018-03-17 ENCOUNTER — Other Ambulatory Visit: Payer: Self-pay

## 2018-03-17 NOTE — Telephone Encounter (Signed)
This RN called [name] on 11/5 - discussed MD concern due to flucuating tumor marker and noted weight loss.  [name] verbalized understanding- no further needs at this time.

## 2018-03-18 ENCOUNTER — Other Ambulatory Visit: Payer: Medicare Other

## 2018-03-18 ENCOUNTER — Inpatient Hospital Stay: Admission: RE | Admit: 2018-03-18 | Payer: Medicare Other | Source: Ambulatory Visit

## 2018-03-18 ENCOUNTER — Ambulatory Visit (HOSPITAL_COMMUNITY)
Admission: RE | Admit: 2018-03-18 | Discharge: 2018-03-18 | Disposition: A | Discharge status: Home / Self Care | Payer: Medicare Other | Source: Ambulatory Visit | Attending: Gastroenterology | Admitting: Gastroenterology

## 2018-03-18 DIAGNOSIS — K7460 Unspecified cirrhosis of liver: Secondary | ICD-10-CM | POA: Insufficient documentation

## 2018-03-18 DIAGNOSIS — N200 Calculus of kidney: Secondary | ICD-10-CM | POA: Diagnosis not present

## 2018-03-18 DIAGNOSIS — I8500 Esophageal varices without bleeding: Secondary | ICD-10-CM | POA: Insufficient documentation

## 2018-03-18 DIAGNOSIS — I700 Atherosclerosis of aorta: Secondary | ICD-10-CM | POA: Diagnosis not present

## 2018-03-18 DIAGNOSIS — R590 Localized enlarged lymph nodes: Secondary | ICD-10-CM | POA: Insufficient documentation

## 2018-03-18 DIAGNOSIS — R188 Other ascites: Secondary | ICD-10-CM | POA: Insufficient documentation

## 2018-03-18 DIAGNOSIS — R918 Other nonspecific abnormal finding of lung field: Secondary | ICD-10-CM | POA: Diagnosis not present

## 2018-03-18 DIAGNOSIS — R161 Splenomegaly, not elsewhere classified: Secondary | ICD-10-CM | POA: Diagnosis not present

## 2018-03-18 DIAGNOSIS — R634 Abnormal weight loss: Secondary | ICD-10-CM | POA: Diagnosis not present

## 2018-03-18 MED ORDER — SODIUM CHLORIDE (PF) 0.9 % IJ SOLN
INTRAMUSCULAR | Status: AC
  Filled 2018-03-18: qty 50

## 2018-03-18 MED ORDER — IOHEXOL 300 MG/ML  SOLN
100.0000 mL | Freq: Once | INTRAMUSCULAR | Status: AC | PRN
  Administered 2018-03-18: 100 mL via INTRAVENOUS

## 2018-03-18 MED ORDER — HEPARIN SOD (PORK) LOCK FLUSH 100 UNIT/ML IV SOLN
INTRAVENOUS | Status: AC
  Administered 2018-03-18: 500 [IU]
  Filled 2018-03-18: qty 5

## 2018-03-18 MED ORDER — HEPARIN SOD (PORK) LOCK FLUSH 100 UNIT/ML IV SOLN: 500.0000 [IU] | Freq: Once | INTRAVENOUS | Status: DC

## 2018-03-22 ENCOUNTER — Other Ambulatory Visit: Payer: Self-pay | Admitting: Oncology

## 2018-03-22 DIAGNOSIS — Z170 Estrogen receptor positive status [ER+]: Secondary | ICD-10-CM

## 2018-03-22 DIAGNOSIS — C562 Malignant neoplasm of left ovary: Secondary | ICD-10-CM

## 2018-03-22 DIAGNOSIS — C772 Secondary and unspecified malignant neoplasm of intra-abdominal lymph nodes: Secondary | ICD-10-CM

## 2018-03-22 DIAGNOSIS — C50811 Malignant neoplasm of overlapping sites of right female breast: Secondary | ICD-10-CM

## 2018-03-22 NOTE — Progress Notes (Signed)
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  She just had scans under Dr. Havery Moros, showing  Ct Chest W Contrast  Result Date: 03/19/2018 CLINICAL DATA:  Clear cell ovarian cancer. History of breast cancer. EXAM: CT CHEST, ABDOMEN, AND PELVIS WITH CONTRAST TECHNIQUE: Multidetector CT imaging of the chest, abdomen and pelvis was performed following the standard protocol during bolus administration of intravenous contrast. CONTRAST:  179mL OMNIPAQUE IOHEXOL 300 MG/ML  SOLN COMPARISON:  PET-CT 03/21/2017.  Abdomen and pelvis CT 10/17/2016. FINDINGS: CT CHEST FINDINGS Cardiovascular: Heart is enlarged. Coronary artery calcification is evident. Atherosclerotic calcification is noted in the wall of the thoracic aorta. Left Port-A-Cath tip is positioned in the distal SVC near the junction with the RA. Mediastinum/Nodes: Stable appearance of small mediastinal lymph nodes. 9 mm short axis right paratracheal node (20/2) is unchanged since 03/21/2017. 15 mm short axis subcarinal lymph node on today's exam was 19 mm short axis previously. There is no hilar lymphadenopathy. There is no axillary lymphadenopathy. Surgical clips are evident in the right axilla. 6 mm short axis left subpectoral lymph node is not substantially changed. Paraesophageal varices evident. Lungs/Pleura: The central tracheobronchial airways are patent. 6 mm posterior right middle lobe nodule (83/6) is stable. 9 mm nodule posterior right lung base (103/6) is also unchanged. There is basilar atelectasis bilaterally with small bilateral pleural effusions. Right apical scarring is similar to prior and likely from previous radiation. Musculoskeletal: No worrisome lytic or sclerotic osseous abnormality. CT ABDOMEN PELVIS FINDINGS Hepatobiliary: Nodular liver contour compatible with cirrhosis. No focal abnormality evident within the liver parenchyma. Gallbladder surgically absent. No intrahepatic or extrahepatic biliary dilation. Pancreas: No focal mass  lesion. No dilatation of the main duct. No intraparenchymal cyst. No peripancreatic edema. Spleen: Spleen is enlarged without focal abnormality. Adrenals/Urinary Tract: No adrenal nodule or mass. 2 mm nonobstructing stone identified interpolar right kidney. 2.2 cm cyst identified interpolar left kidney No evidence for hydroureter. The urinary bladder appears normal for the degree of distention. Stomach/Bowel: Stomach is nondistended. No gastric wall thickening. No evidence of outlet obstruction. Duodenum is normally positioned as is the ligament of Treitz. No small bowel wall thickening. No small bowel dilatation. The terminal ileum is normal. The appendix is not visualized, but there is no edema or inflammation in the region of the cecum. No gross colonic mass. No colonic wall thickening. Vascular/Lymphatic: There is abdominal aortic atherosclerosis without aneurysm. Portal vein is patent. Splenic vein is patent. Paraesophageal varices evident. Small lymph nodes are seen in the gastrohepatic and hepato duodenal ligaments. 2.6 cm short axis lymph node in the left para-aortic space (72/2) was hypermetabolic on previous PET-CT and measured 1.5 cm at that time. Additional left para-aortic lymph nodes (67/2) are progressive in the interval the and measure up to 15 mm short axis. No pelvic sidewall lymphadenopathy. Reproductive: Uterus surgically absent. There is no adnexal mass. Fluid and gas bubbles noted in the vagina. Other: Small volume ascites identified around the liver and spleen. Musculoskeletal: Degenerative changes noted in the left hip. Bones are diffusely demineralized. No worrisome lytic or sclerotic osseous abnormality. IMPRESSION: 1. Interval progression of para-aortic retroperitoneal lymphadenopathy. This lymphadenopathy was noted to be hypermetabolic on PET-CT of 11/02/7626. 2. Cirrhotic liver morphology. 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 3. Stable right  lung nodules and stable borderline to mild mediastinal lymphadenopathy (non hypermetabolic on previous PET-CT). 4. Small volume ascites. 5. Tiny nonobstructing right renal stone. 6.  Aortic Atherosclerois (ICD10-170.0) Electronically Signed   By: Randall Hiss  Tery Sanfilippo M.D.   On: 03/19/2018 12:07   Ct Abdomen Pelvis W Contrast  Result Date: 03/19/2018 CLINICAL DATA:  Clear cell ovarian cancer. History of breast cancer. EXAM: CT CHEST, ABDOMEN, AND PELVIS WITH CONTRAST TECHNIQUE: Multidetector CT imaging of the chest, abdomen and pelvis was performed following the standard protocol during bolus administration of intravenous contrast. CONTRAST:  141mL OMNIPAQUE IOHEXOL 300 MG/ML  SOLN COMPARISON:  PET-CT 03/21/2017.  Abdomen and pelvis CT 10/17/2016. FINDINGS: CT CHEST FINDINGS Cardiovascular: Heart is enlarged. Coronary artery calcification is evident. Atherosclerotic calcification is noted in the wall of the thoracic aorta. Left Port-A-Cath tip is positioned in the distal SVC near the junction with the RA. Mediastinum/Nodes: Stable appearance of small mediastinal lymph nodes. 9 mm short axis right paratracheal node (20/2) is unchanged since 03/21/2017. 15 mm short axis subcarinal lymph node on today's exam was 19 mm short axis previously. There is no hilar lymphadenopathy. There is no axillary lymphadenopathy. Surgical clips are evident in the right axilla. 6 mm short axis left subpectoral lymph node is not substantially changed. Paraesophageal varices evident. Lungs/Pleura: The central tracheobronchial airways are patent. 6 mm posterior right middle lobe nodule (83/6) is stable. 9 mm nodule posterior right lung base (103/6) is also unchanged. There is basilar atelectasis bilaterally with small bilateral pleural effusions. Right apical scarring is similar to prior and likely from previous radiation. Musculoskeletal: No worrisome lytic or sclerotic osseous abnormality. CT ABDOMEN PELVIS FINDINGS Hepatobiliary: Nodular  liver contour compatible with cirrhosis. No focal abnormality evident within the liver parenchyma. Gallbladder surgically absent. No intrahepatic or extrahepatic biliary dilation. Pancreas: No focal mass lesion. No dilatation of the main duct. No intraparenchymal cyst. No peripancreatic edema. Spleen: Spleen is enlarged without focal abnormality. Adrenals/Urinary Tract: No adrenal nodule or mass. 2 mm nonobstructing stone identified interpolar right kidney. 2.2 cm cyst identified interpolar left kidney No evidence for hydroureter. The urinary bladder appears normal for the degree of distention. Stomach/Bowel: Stomach is nondistended. No gastric wall thickening. No evidence of outlet obstruction. Duodenum is normally positioned as is the ligament of Treitz. No small bowel wall thickening. No small bowel dilatation. The terminal ileum is normal. The appendix is not visualized, but there is no edema or inflammation in the region of the cecum. No gross colonic mass. No colonic wall thickening. Vascular/Lymphatic: There is abdominal aortic atherosclerosis without aneurysm. Portal vein is patent. Splenic vein is patent. Paraesophageal varices evident. Small lymph nodes are seen in the gastrohepatic and hepato duodenal ligaments. 2.6 cm short axis lymph node in the left para-aortic space (72/2) was hypermetabolic on previous PET-CT and measured 1.5 cm at that time. Additional left para-aortic lymph nodes (67/2) are progressive in the interval the and measure up to 15 mm short axis. No pelvic sidewall lymphadenopathy. Reproductive: Uterus surgically absent. There is no adnexal mass. Fluid and gas bubbles noted in the vagina. Other: Small volume ascites identified around the liver and spleen. Musculoskeletal: Degenerative changes noted in the left hip. Bones are diffusely demineralized. No worrisome lytic or sclerotic osseous abnormality. IMPRESSION: 1. Interval progression of para-aortic retroperitoneal lymphadenopathy.  This lymphadenopathy was noted to be hypermetabolic on PET-CT of 42/35/3614. 2. Cirrhotic liver morphology. 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 3. Stable right lung nodules and stable borderline to mild mediastinal lymphadenopathy (non hypermetabolic on previous PET-CT). 4. Small volume ascites. 5. Tiny nonobstructing right renal stone. 6.  Aortic Atherosclerois (ICD10-170.0) Electronically Signed   By: Misty Stanley M.D.   On: 03/19/2018 12:07  At this point I think a PET scan may be able to give [name] some answers and I have gone ahead and called her letter nowhere putting the order in and I will call her with results.

## 2018-03-31 ENCOUNTER — Other Ambulatory Visit: Payer: Self-pay | Admitting: *Deleted

## 2018-03-31 ENCOUNTER — Other Ambulatory Visit: Payer: Medicare Other

## 2018-03-31 ENCOUNTER — Inpatient Hospital Stay: Admission: RE | Admit: 2018-03-31 | Payer: Medicare Other | Source: Ambulatory Visit

## 2018-04-02 ENCOUNTER — Ambulatory Visit (HOSPITAL_COMMUNITY): Payer: Medicare Other

## 2018-04-02 ENCOUNTER — Encounter (HOSPITAL_COMMUNITY)
Admission: RE | Admit: 2018-04-02 | Discharge: 2018-04-02 | Disposition: A | Discharge status: Home / Self Care | Payer: Medicare Other | Source: Ambulatory Visit | Attending: Oncology | Admitting: Oncology

## 2018-04-02 DIAGNOSIS — C50811 Malignant neoplasm of overlapping sites of right female breast: Secondary | ICD-10-CM | POA: Diagnosis present

## 2018-04-02 DIAGNOSIS — C772 Secondary and unspecified malignant neoplasm of intra-abdominal lymph nodes: Secondary | ICD-10-CM | POA: Insufficient documentation

## 2018-04-02 DIAGNOSIS — Z170 Estrogen receptor positive status [ER+]: Secondary | ICD-10-CM | POA: Diagnosis present

## 2018-04-02 DIAGNOSIS — C562 Malignant neoplasm of left ovary: Secondary | ICD-10-CM | POA: Insufficient documentation

## 2018-04-02 LAB — GLUCOSE, CAPILLARY: GLUCOSE-CAPILLARY: 74 mg/dL (ref 70–99)

## 2018-04-02 MED ORDER — FLUDEOXYGLUCOSE F - 18 (FDG) INJECTION
7.9000 | Freq: Once | INTRAVENOUS | Status: AC | PRN
  Administered 2018-04-02: 7.9 via INTRAVENOUS

## 2018-04-03 ENCOUNTER — Other Ambulatory Visit: Payer: Self-pay | Admitting: Oncology

## 2018-04-03 ENCOUNTER — Other Ambulatory Visit: Payer: Self-pay | Admitting: Family Medicine

## 2018-04-06 ENCOUNTER — Other Ambulatory Visit: Payer: Self-pay | Admitting: Oncology

## 2018-04-07 ENCOUNTER — Other Ambulatory Visit: Payer: Self-pay | Admitting: Oncology

## 2018-04-07 ENCOUNTER — Telehealth: Payer: Self-pay | Admitting: *Deleted

## 2018-04-07 NOTE — Telephone Encounter (Signed)
Dr. Jana Hakim placed call to patient and patient's husband to review results.

## 2018-04-07 NOTE — Telephone Encounter (Signed)
"  Genna Casimir Akers's husband [name] [phone]) calling to talk with Dr. Jana Hakim to received results.  She does not have any upcoming appointments."  FYI next scheduled appointment is May 17, 2018.

## 2018-04-07 NOTE — Progress Notes (Signed)
I called [name] and discussed the results of the PET scan with her and with [name].  The message is mixed because there is a hot periaortic lymph node by this is the multiple small minimally active lymph nodes are very different and of less concern) but the Ca1 25 is dropping instead of rising.  We decided we are going to repeat low labs on 0106 and she will see me a few days later.  I am going to add a CA-27-29 to those readings.  At that point we can consider whether we want to biopsy this lesion or initiate a different type of treatment.

## 2018-04-09 ENCOUNTER — Other Ambulatory Visit: Payer: Self-pay | Admitting: Oncology

## 2018-04-09 ENCOUNTER — Other Ambulatory Visit (HOSPITAL_COMMUNITY): Payer: Medicare Other

## 2018-04-23 ENCOUNTER — Ambulatory Visit (INDEPENDENT_AMBULATORY_CARE_PROVIDER_SITE_OTHER): Payer: Medicare Other | Admitting: Family Medicine

## 2018-04-23 ENCOUNTER — Encounter: Payer: Self-pay | Admitting: Family Medicine

## 2018-04-23 ENCOUNTER — Telehealth: Payer: Self-pay | Admitting: Family Medicine

## 2018-04-23 VITALS — BP 108/58 | HR 74 | Temp 98.4°F | Resp 16 | Ht 65.5 in | Wt 159.2 lb

## 2018-04-23 DIAGNOSIS — E785 Hyperlipidemia, unspecified: Secondary | ICD-10-CM | POA: Diagnosis not present

## 2018-04-23 DIAGNOSIS — N3941 Urge incontinence: Secondary | ICD-10-CM | POA: Diagnosis not present

## 2018-04-23 DIAGNOSIS — C50811 Malignant neoplasm of overlapping sites of right female breast: Secondary | ICD-10-CM

## 2018-04-23 DIAGNOSIS — C50919 Malignant neoplasm of unspecified site of unspecified female breast: Secondary | ICD-10-CM

## 2018-04-23 DIAGNOSIS — C569 Malignant neoplasm of unspecified ovary: Secondary | ICD-10-CM | POA: Diagnosis not present

## 2018-04-23 DIAGNOSIS — I10 Essential (primary) hypertension: Secondary | ICD-10-CM

## 2018-04-23 DIAGNOSIS — E1165 Type 2 diabetes mellitus with hyperglycemia: Secondary | ICD-10-CM

## 2018-04-23 DIAGNOSIS — IMO0002 Reserved for concepts with insufficient information to code with codable children: Secondary | ICD-10-CM

## 2018-04-23 DIAGNOSIS — Z170 Estrogen receptor positive status [ER+]: Secondary | ICD-10-CM

## 2018-04-23 DIAGNOSIS — E1151 Type 2 diabetes mellitus with diabetic peripheral angiopathy without gangrene: Secondary | ICD-10-CM

## 2018-04-23 DIAGNOSIS — E1129 Type 2 diabetes mellitus with other diabetic kidney complication: Secondary | ICD-10-CM

## 2018-04-23 DIAGNOSIS — R531 Weakness: Secondary | ICD-10-CM

## 2018-04-23 LAB — POC URINALSYSI DIPSTICK (AUTOMATED)
GLUCOSE UA: NEGATIVE
KETONES UA: NEGATIVE
NITRITE UA: NEGATIVE
PH UA: 6 (ref 5.0–8.0)
Protein, UA: NEGATIVE
RBC UA: NEGATIVE
SPEC GRAV UA: 1.025 (ref 1.010–1.025)
Urobilinogen, UA: >=8 E.U./dL — AB

## 2018-04-23 MED ORDER — MIRABEGRON ER 50 MG PO TB24: 50.0000 mg | ORAL_TABLET | Freq: Every day | ORAL | 2 refills | Status: DC

## 2018-04-23 MED ORDER — NONFORMULARY OR COMPOUNDED ITEM: 0 refills | Status: DC

## 2018-04-23 NOTE — Patient Instructions (Signed)
As soon as we get the paperwork for the scooter we will get it filled out and faxed back

## 2018-04-23 NOTE — Telephone Encounter (Signed)
Copied from [place] [phone]. Topic: Quick Communication - See Telephone Encounter >> Apr 23, 2018  5:25 PM R[place]ford Nail, [place] wrote: CRM for notification. See Telephone encounter for: 04/23/18. Patient's husband calling and states that the medication that Dr Etter Sjogren sent in today for the patient's bladder is too expensive. States that it is $45 and he cannot afford that. Would like to know what other medication would help her? Please advise. [place], - [place], [place]

## 2018-04-23 NOTE — Progress Notes (Addendum)
Patient ID: [name], female    DOB: Aug 15, 1955  Age: 62 y.o. MRN: [id number]    Subjective:  Subjective  HPI [name] presents for mobility exam   She is very week in the legs esp L leg   She has a hx of breast and ovarian CA-- she sees Dr Jana Hakim  She is too weak in Upper ext to use a manual wheelchair   She needs the chair to help her with ADL---  toileting ,  Bathing and working in the kitchen.  She is unable to use cane or walker due to inc fall risk.  She is unable to use handlebars with tiller steering so she is unable to use scooter.  She has the physical and mental ability to operate a power wheelchair safely in the home and is willing and motivated to use the power wheelchair.    Review of Systems  Constitutional: Positive for activity change and appetite change. Negative for diaphoresis, fatigue and unexpected weight change.  Eyes: Negative for pain, redness and visual disturbance.  Respiratory: Negative for cough, chest tightness, shortness of breath and wheezing.   Cardiovascular: Negative for chest pain, palpitations and leg swelling.  Endocrine: Negative for cold intolerance, heat intolerance, polydipsia, polyphagia and polyuria.  Genitourinary: Negative for difficulty urinating, dysuria and frequency.  Neurological: Positive for weakness. Negative for dizziness, light-headedness, numbness and headaches.    History Past Medical History:  Diagnosis Date  . Allergy   . Anemia    low iron hx  . Arthritis   . Breast cancer ([place]) 2014   a. 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 Cataract    removed ou  . Chronic combined systolic and diastolic CHF (congestive heart failure) ([place])    a. 08/2012 Echo: EF 55-60%, no rwma, mod MR;  b. 07/2014 Echo: EF 45-50%, distal antsept HK, mod TR/MR, mildly bil-atrial enlargement.  . Clear Cell Ovarian Cancer    a.  06/2014 s/p TAH/BSO debulking/lymph node dissection;  b. Now on chemo.  . Depression   . Diabetes mellitus   . DJD (degenerative joint disease) of lumbar spine   . DVT (deep venous thrombosis) ([facility])    hx of on HRT left leg ~2006  . Gallstones   . GERD (gastroesophageal reflux disease)   . Hyperlipidemia   . Hypertension   . Hypothyroidism   . Liver disease, chronic, with cirrhosis ([place])    ? autoimmune  . OSA on CPAP    cpap 4.5 setting  . Paroxysmal atrial fibrillation ([facility])    a. CHA2DS2VASc = 4-->coumadin.  [name] Physical deconditioning   . PPD positive, treated    rx inh   . Sleep apnea     She has a past surgical history that includes Tubal ligation; Cholecystectomy; Foot surgery; Eye surgery; Cataract extraction; Percutaneous liver biopsy; Breast biopsy; Mastectomy modified radical (06/08/2012); Portacath placement (06/08/2012); PEG tube placement; history of chemotherapy x 2 treatments, radiation tx (2014); pac removed (2015); laparotomy (N/A, 06/27/2014); Abdominal hysterectomy (N/A, 06/27/2014); Salpingoophorectomy (Bilateral, 06/27/2014); Breast excisional biopsy (Left, 2002); and Mastectomy (Right, 2014).   Her family history includes Arthritis in her mother; Colon cancer in her maternal grandfather and paternal grandfather; Dementia in her father; Diabetes in her mother and paternal grandmother; Heart disease in her father and mother; Heart failure in her mother; Hypertension in her mother; Other in her daughter, daughter, and  mother; Stroke in her father.She reports that she has quit smoking. She has a 1.00 pack-year smoking history. She has quit using smokeless tobacco. She reports that she does not drink alcohol or use drugs.  Current Outpatient Medications on File Prior to Visit  Medication Sig Dispense Refill  . acetaminophen (TYLENOL) 500 MG tablet Take 500 mg by mouth every 6 (six) hours as needed for moderate pain.    [name] ALPRAZolam (XANAX) 0.5 MG tablet TAKE 1 TABLET BY MOUTH  TWO  TIMES DAILY AS NEEDED FOR  ANXIETY 60 tablet 0  . buPROPion (WELLBUTRIN XL) 150 MG 24 hr tablet TAKE 1 TABLET BY MOUTH  DAILY 90 tablet 1  . Continuous Blood Gluc Receiver (FREESTYLE LIBRE READER) DEVI Inject 1 Stick as directed daily. Use daily to check blood sugar. E11.9 1 Device 0  . escitalopram (LEXAPRO) 20 MG tablet TAKE 1 TABLET BY MOUTH  EVERY MORNING 90 tablet 1  . famotidine (PEPCID) 20 MG tablet Take 1 tablet (20 mg total) by mouth daily. 30 tablet 0  . furosemide (LASIX) 20 MG tablet TAKE 1 TABLET BY MOUTH  DAILY 90 tablet 0  . glimepiride (AMARYL) 2 MG tablet TAKE 1 TABLET BY MOUTH TWO  TIMES DAILY 180 tablet 0  . glucose blood (BAYER CONTOUR NEXT TEST) test strip Use as instructed to check pt's blood sugar twice a day.  DX E11.9 100 each 5  . glucose blood (ONETOUCH VERIO) test strip USE TO CHECK BLOOD SUGAR TWICE DAILY 100 each 1  . ketoconazole (NIZORAL) 2 % cream Apply 1 application topically daily. 15 g 3  . letrozole (FEMARA) 2.5 MG tablet TAKE 1 TABLET BY MOUTH  DAILY 90 tablet 4  . levothyroxine (SYNTHROID, LEVOTHROID) 150 MCG tablet TAKE 1 TABLET BY MOUTH  DAILY 90 tablet 0  . lidocaine-prilocaine (EMLA) cream APPLY TOPICALLY OVER PORT  AREA 1-2 HOURS BEFORE  CHEMOTHERAPY AS NEEDED 30 g 1  . metFORMIN (GLUCOPHAGE-XR) 500 MG 24 hr tablet TAKE 2 TABLETS BY MOUTH  EVERY EVENING 180 tablet 1  . metroNIDAZOLE (METROGEL) 1 % gel Apply topically daily. 45 g 0  . nadolol (CORGARD) 40 MG tablet Take 1 tablet (40 mg total) by mouth daily. 90 tablet 3  . NONFORMULARY OR COMPOUNDED ITEM Cmp, lipid, hgba1c, tsh--- dx , diabetes II, htn, hyperlipidemia, hypothyroidism 1 each 0  . OLANZapine (ZYPREXA) 2.5 MG tablet TAKE 1 TABLET BY MOUTH  NIGHTLY AT BEDTIME 90 tablet 0  . omeprazole (PRILOSEC) 20 MG capsule Take 1 capsule (20 mg total) by mouth daily. 90 capsule 3  . ondansetron (ZOFRAN) 8 MG tablet Take 1 tablet (8 mg total) by mouth every 8 (eight) hours as needed for nausea or  vomiting. 270 tablet 0  . ONETOUCH DELICA LANCETS 96P MISC USE TO CHECK BLOOD SUGAR TWICE DAILY 100 each 0  . potassium chloride (K-DUR,KLOR-CON) 10 MEQ tablet Take 1 tablet (10 mEq total) by mouth 2 (two) times daily. 180 tablet 0  . pravastatin (PRAVACHOL) 20 MG tablet TAKE 1 TABLET BY MOUTH AT  BEDTIME 90 tablet 1  . propranolol (INDERAL) 20 MG tablet TAKE 1 TABLET BY MOUTH TWO  TIMES DAILY 180 tablet 3   No current facility-administered medications on file prior to visit.      Objective:  Objective  Physical Exam Vitals signs and nursing note reviewed.  Constitutional:      Appearance: She is well-developed.  HENT:     Head: Normocephalic and atraumatic.  Eyes:  Conjunctiva/sclera: Conjunctivae normal.  Neck:     Musculoskeletal: Normal range of motion and neck supple.     Thyroid: No thyromegaly.     Vascular: No carotid bruit or JVD.  Cardiovascular:     Rate and Rhythm: Normal rate and regular rhythm.     Heart sounds: Normal heart sounds. No murmur.  Pulmonary:     Effort: Pulmonary effort is normal. No respiratory distress.     Breath sounds: Normal breath sounds. No wheezing or rales.  Chest:     Chest wall: No tenderness.  Neurological:     Mental Status: She is alert and oriented to person, place, and time.     Comments:    L ext-- LLE 2/5             RLE3/5  UE-- LUE3/5           RUE 4/5      BP (!) 108/58 (BP Location: Left Arm, Cuff Size: Normal)   Pulse 74   Temp 98.4 F (36.9 C) (Oral)   Resp 16   Ht 5' 5.5" (1.664 m)   Wt 159 lb 3.2 oz (72.2 kg)   SpO2 97%   BMI 26.09 kg/m  Wt Readings from Last 3 Encounters:  04/23/18 159 lb 3.2 oz (72.2 kg)  01/20/18 160 lb 8 oz (72.8 kg)  11/09/17 169 lb 6.4 oz (76.8 kg)     Lab Results  Component Value Date   WBC 3.1 (L) 03/15/2018   HGB 10.4 (L) 03/15/2018   HCT 31.9 (L) 03/15/2018   PLT 82 (L) 03/15/2018   GLUCOSE 145 (H) 03/15/2018   CHOL 119 02/20/2017   TRIG 124.0 02/20/2017   HDL  22.20 (L) 02/20/2017   LDLDIRECT 160.2 09/23/2010   LDLCALC 72 02/20/2017   ALT 21 03/15/2018   AST 38 03/15/2018   NA 141 03/15/2018   K 3.3 (L) 03/15/2018   CL 109 03/15/2018   CREATININE 0.62 03/15/2018   BUN 7 (L) 03/15/2018   CO2 22 03/15/2018   TSH 0.99 02/20/2017   INR 1.5 (H) 12/21/2017   HGBA1C 4.9 02/20/2017   MICROALBUR 3.7 (H) 10/26/2015    Nm Pet Image Restag (ps) Skull Base To Thigh  Result Date: 04/02/2018 CLINICAL DATA:  Subsequent treatment strategy for ovarian cancer RIGHT breast cancer. Lymphadenopathy. EXAM: NUCLEAR MEDICINE PET SKULL BASE TO THIGH TECHNIQUE: 7.9 mCi F-18 FDG was injected intravenously. Full-ring PET imaging was performed from the skull base to thigh after the radiotracer. CT data was obtained and used for attenuation correction and anatomic localization. Fasting blood glucose: 74 mg/dl COMPARISON:  PET-CT 03/21/2017 FINDINGS: Mediastinal blood pool activity: SUV max 1.93 NECK: No hypermetabolic lymph nodes in the neck. Incidental CT findings: none CHEST: Several mildly hypermetabolic and nonenlarged LEFT axial lymph nodes are present. Example 2 adjacent lymph nodes along the high LEFT axilla beneath the pectoralis muscle measure 6 and 7 mm on image 53/4 with SUV max equal 2.7 in 3.2. Third mildly hypermetabolic nonenlarged lymph node in the axilla with SUV max equal 2.6. Prior RIGHT axillary nodal dissection.  RIGHT mastectomy anatomy. Within the mediastinum, there are numerous minimally enlarged mediastinal lymph nodes. The largest subcarinal node at 1.4 cm without significant metabolic activity. This is slightly increased from 1.2 cm on prior. One prevascular lymph node measuring 8 mm (image 59/4) may be increased from 7 mm and has now mild metabolic activity SUV max equal 2.5 Incidental CT findings: Port in the anterior chest  wall with tip in distal SVC. Small RIGHT effusion. No suspicious pulmonary nodularity. ABDOMEN/PELVIS: Enlarged lymph nodes in the  retroperitoneum LEFT of the aorta. The largest measures 2.4 cm increased from 1.4 cm. This enlarged lymph node is intensely hypermetabolic with SUV max equal 16.9 increased from SUV max equal 5.7. No hypermetabolic lymph nodes in the pelvis.  No new adenopathy. No abnormal activity in the liver. No abnormal metabolic activity the spleen. Spleen is normal volume. Incidental CT findings: Post hysterectomy. SKELETON: No focal hypermetabolic activity to suggest skeletal metastasis. Incidental CT findings: none IMPRESSION: 1. Interval increase in size and metabolic activity of retroperitoneal lymph nodes LEFT of aorta at the level of the kidneys consistent with metastatic nodal disease progression. 2. Interval increase in metabolic activity of small LEFT axillary lymph nodes and mild increase in size of small prominent mediastinal lymph nodes with at least one with mild activity. While the hypermetabolic activity is low and these lymph nodes are small, concern for progression of metastatic adenopathy outside the pelvis. Electronically Signed   By: Suzy Bouchard M.D.   On: 04/02/2018 09:13     Assessment & Plan:  Plan  I am having Lindia Garms. Velis start on mirabegron ER and NONFORMULARY OR COMPOUNDED ITEM. I am also having her maintain her famotidine, acetaminophen, metroNIDAZOLE, glucose blood, NONFORMULARY OR COMPOUNDED ITEM, ketoconazole, nadolol, FREESTYLE LIBRE READER, ONETOUCH DELICA LANCETS 40J, glucose blood, ondansetron, glimepiride, propranolol, letrozole, metFORMIN, pravastatin, buPROPion, OLANZapine, potassium chloride, omeprazole, lidocaine-prilocaine, escitalopram, levothyroxine, furosemide, and ALPRAZolam.  Meds ordered this encounter  Medications  . mirabegron ER (MYRBETRIQ) 50 MG TB24 tablet    Sig: Take 1 tablet (50 mg total) by mouth daily.    Dispense:  30 tablet    Refill:  2  . NONFORMULARY OR COMPOUNDED ITEM    Sig: Please draw cbcd, cmp,, lipid, hgba1c    Dx DMII,  Hyperlipidemia     Dispense:  1 each    Refill:  0    Problem List Items Addressed This Visit      Unprioritized   DM (diabetes mellitus) type II uncontrolled, periph vascular disorder ([employer])   Relevant Medications   NONFORMULARY OR COMPOUNDED ITEM   Other Relevant Orders   Lipid panel   CBC with Differential/Platelet   Comprehensive metabolic panel   Hemoglobin A1c   POCT Urinalysis Dipstick (Automated) (Completed)   DM (diabetes mellitus), type 2, uncontrolled, with renal complications ([place])    Check labs        Essential hypertension    Well controlled, no changes to meds. Encouraged heart healthy diet such as the DASH diet and exercise as tolerated.       Hyperlipidemia    Tolerating statin, encouraged heart healthy diet, avoid trans fats, minimize simple carbs and saturated fats. Increase exercise as tolerated      Relevant Medications   NONFORMULARY OR COMPOUNDED ITEM   Other Relevant Orders   Lipid panel   CBC with Differential/Platelet   Comprehensive metabolic panel   Hemoglobin A1c   POCT Urinalysis Dipstick (Automated) (Completed)   Malignant neoplasm of female breast ([facility])   Relevant Medications   NONFORMULARY OR COMPOUNDED ITEM   Malignant neoplasm of ovary ([place])    Per onc      Relevant Medications   NONFORMULARY OR COMPOUNDED ITEM   Malignant neoplasm of overlapping sites of right breast in female, estrogen receptor positive ([place])    Per onc      Urge incontinence of urine - Primary  Relevant Medications   mirabegron ER (MYRBETRIQ) 50 MG TB24 tablet   NONFORMULARY OR COMPOUNDED ITEM   Other Relevant Orders   Urine Culture   Weakness    Mobility exam done Will fill out papers for power wheelchair when we get them         Follow-up: Return in about 6 months (around 10/23/2018), or if symptoms worsen or fail to improve.  Ann Held, DO

## 2018-04-25 DIAGNOSIS — N3941 Urge incontinence: Secondary | ICD-10-CM | POA: Insufficient documentation

## 2018-04-25 NOTE — Assessment & Plan Note (Signed)
Per onc  

## 2018-04-25 NOTE — Assessment & Plan Note (Signed)
Tolerating statin, encouraged heart healthy diet, avoid trans fats, minimize simple carbs and saturated fats. Increase exercise as tolerated 

## 2018-04-25 NOTE — Assessment & Plan Note (Signed)
Mobility exam done Will fill out papers for power wheelchair when we get them

## 2018-04-25 NOTE — Assessment & Plan Note (Signed)
Check labs 

## 2018-04-25 NOTE — Assessment & Plan Note (Signed)
Well controlled, no changes to meds. Encouraged heart healthy diet such as the DASH diet and exercise as tolerated.  °

## 2018-04-26 ENCOUNTER — Telehealth: Payer: Self-pay | Admitting: Family Medicine

## 2018-04-26 LAB — URINE CULTURE
MICRO NUMBER:: 91495500
SPECIMEN QUALITY:: ADEQUATE

## 2018-04-26 NOTE — Telephone Encounter (Signed)
Copied from [place] [id number]. Topic: General - Inquiry >> Apr 26, 2018 11:17 AM Margot Ables wrote: Reason for CRM: Calling to see if paperwork faxed 04/22/18 for mobility evaluation was received. It was faxed to [phone]. Pt is wanting a power scooter.  Please call to f/u with Kennyth Lose at [phone], option 8, ext [phone]

## 2018-04-26 NOTE — Telephone Encounter (Signed)
vesicare 10 mg #30  1 po qhs, 2 refills

## 2018-04-26 NOTE — Telephone Encounter (Signed)
Copied from [place] [id number]. Topic: Quick Communication - See Telephone Encounter >> Apr 26, 2018  9:12 AM Loma [place] wrote: CRM for notification. See Telephone encounter for: 04/26/18.

## 2018-04-26 NOTE — Telephone Encounter (Signed)
Mirabegron too expensive- requesting alternative Rx

## 2018-04-27 ENCOUNTER — Ambulatory Visit: Payer: Self-pay

## 2018-04-27 ENCOUNTER — Other Ambulatory Visit: Payer: Self-pay | Admitting: Family Medicine

## 2018-04-27 DIAGNOSIS — N390 Urinary tract infection, site not specified: Secondary | ICD-10-CM

## 2018-04-27 DIAGNOSIS — N3941 Urge incontinence: Secondary | ICD-10-CM

## 2018-04-27 MED ORDER — SOLIFENACIN SUCCINATE 10 MG PO TABS: 10.0000 mg | ORAL_TABLET | Freq: Every day | ORAL | 2 refills | Status: DC

## 2018-04-27 MED ORDER — CIPROFLOXACIN HCL 250 MG PO TABS: 250.0000 mg | ORAL_TABLET | Freq: Two times a day (BID) | ORAL | 0 refills | Status: DC

## 2018-04-27 NOTE — Telephone Encounter (Signed)
Author phoned [facility], as no ppwk has been received by Dr. Etter Sjogren or [name], [place]. Author requested ppwk to be re-faxed to [phone]. Awaiting fax.

## 2018-04-27 NOTE — Telephone Encounter (Signed)
  Returned call to patients husband who states that Dr Carollee Herter had ordered his wife a new medication last week and it was too expensive.  She was suppose to order something different yesterday but has not. Mr Lonzo is requesting to speak directly to office. NT spoke to [facility].  Raquel Sarna advised that office would contact pt within an hour. Mr Cordner notified and verbalized understanding. Reason for Disposition . Caller has NON-URGENT medication question about med that PCP prescribed and triager unable to answer question  Answer Assessment - Initial Assessment Questions 1. SYMPTOMS: "Do you have any symptoms?"     no 2. SEVERITY: If symptoms are present, ask "Are they mild, moderate or severe?"     Pt husband is waiting for new medication order for his wife.  Protocols used: MEDICATION QUESTION CALL-A-AH

## 2018-04-27 NOTE — Addendum Note (Signed)
Addended by: Raynelle Dick R on: 04/27/2018 02:34 PM   Modules accepted: Orders

## 2018-04-27 NOTE — Telephone Encounter (Signed)
Author returned phone call to husband re: missing med rx. Pt. Stated the myrbetriq was too expensive, and needed alternative "which was supposed to be sent in yesterday". Author stated she would relay to Dr. Etter Sjogren today to address, and we would let him know status today. Author requesting rx to be sent to [facility], on profile. Author also updated husband on status of [facility] ppwk, still awaiting arrival.

## 2018-04-27 NOTE — Telephone Encounter (Signed)
I sent message to try vesicare--- see phone note

## 2018-04-27 NOTE — Telephone Encounter (Signed)
Vesicare rx ordered per Dr. Nonda Lou specifications from 12/13 telephone encounter. Author phoned husband to make aware that ppwk from mobility services has yet to arrive. Routed to [place], [place], [place], [place], and Santiago Glad, LPN to follow up.

## 2018-04-28 NOTE — Telephone Encounter (Signed)
Received Physician Orders from [place] for [name]; forwarded to provider with OV notes/SLS 12/18

## 2018-04-29 ENCOUNTER — Other Ambulatory Visit: Payer: Self-pay | Admitting: Family Medicine

## 2018-04-29 DIAGNOSIS — N393 Stress incontinence (female) (male): Secondary | ICD-10-CM

## 2018-04-29 MED ORDER — OXYBUTYNIN CHLORIDE 5 MG PO TABS: 5.0000 mg | ORAL_TABLET | Freq: Three times a day (TID) | ORAL | Status: DC

## 2018-04-29 MED ORDER — CIPROFLOXACIN HCL 250 MG PO TABS: 250.0000 mg | ORAL_TABLET | Freq: Two times a day (BID) | ORAL | 0 refills | Status: AC

## 2018-04-29 NOTE — Telephone Encounter (Signed)
oxybutin 5 mg tid #90

## 2018-04-29 NOTE — Telephone Encounter (Signed)
Patient husband stated that alternatives are  oxybutinin Finasteride tamsulosin terozosin  alfuzosin

## 2018-04-30 ENCOUNTER — Telehealth: Payer: Self-pay | Admitting: *Deleted

## 2018-04-30 NOTE — Telephone Encounter (Signed)
Medication sent in. 

## 2018-04-30 NOTE — Telephone Encounter (Signed)
Received Physician Orders from [place] for Final Document on description, make and model of power wheelchair, forwarded to provider; signed and return fax with confirmation/SLS 12/20

## 2018-05-03 NOTE — Telephone Encounter (Signed)
Husband notified that medication was sent in and it should come thru mail order.

## 2018-05-04 ENCOUNTER — Encounter: Payer: Medicare Other | Admitting: Family Medicine

## 2018-05-04 ENCOUNTER — Ambulatory Visit: Payer: Medicare Other | Admitting: *Deleted

## 2018-05-06 ENCOUNTER — Other Ambulatory Visit: Payer: Self-pay | Admitting: Oncology

## 2018-05-10 ENCOUNTER — Ambulatory Visit (INDEPENDENT_AMBULATORY_CARE_PROVIDER_SITE_OTHER): Payer: Medicare Other | Admitting: Internal Medicine

## 2018-05-10 ENCOUNTER — Encounter: Payer: Self-pay | Admitting: Internal Medicine

## 2018-05-10 VITALS — BP 118/76 | HR 71 | Temp 97.8°F | Resp 16 | Ht 65.5 in | Wt 161.2 lb

## 2018-05-10 DIAGNOSIS — J029 Acute pharyngitis, unspecified: Secondary | ICD-10-CM | POA: Diagnosis not present

## 2018-05-10 LAB — POCT RAPID STREP A (OFFICE): Rapid Strep A Screen: NEGATIVE

## 2018-05-10 MED ORDER — NYSTATIN 100000 UNIT/ML MT SUSP: 5.0000 mL | Freq: Four times a day (QID) | OROMUCOSAL | 0 refills | Status: DC

## 2018-05-10 NOTE — Patient Instructions (Signed)
Use nystatin 4 times a day  Drink plenty fluids  Tylenol as needed  ER if severe pain, difficulty swallowing, fever, chills, swelling of the tongue or throat.  Please come back 05/13/2018 at 11 AM for a recheck

## 2018-05-10 NOTE — Progress Notes (Signed)
Subjective:    Patient ID: [name], female    DOB: 01/13/1956, 62 y.o.   MRN: [id number]  DOS:  05/10/2018 Type of visit - description: Acute visit Symptoms of started to 3 days ago: Left-sided sore throat, the tip of her tongue feels raw and hurt, " nothing tastes good". She is currently almost done with a round of ciprofloxacin for a documented UTI.   Review of Systems She denies any sick contacts. No fever chills No sinus congestion Mild cough present. Denies any difficulty swallowing.   Past Medical History:  Diagnosis Date  . Allergy   . Anemia    low iron hx  . Arthritis   . Breast cancer ([place]) 2014   a. 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 Cataract    removed ou  . Chronic combined systolic and diastolic CHF (congestive heart failure) ([place])    a. 08/2012 Echo: EF 55-60%, no rwma, mod MR;  b. 07/2014 Echo: EF 45-50%, distal antsept HK, mod TR/MR, mildly bil-atrial enlargement.  . Clear Cell Ovarian Cancer    a. 06/2014 s/p TAH/BSO debulking/lymph node dissection;  b. Now on chemo.  . Depression   . Diabetes mellitus   . DJD (degenerative joint disease) of lumbar spine   . DVT (deep venous thrombosis) (HCC)    hx of on HRT left leg ~2006  . Gallstones   . GERD (gastroesophageal reflux disease)   . Hyperlipidemia   . Hypertension   . Hypothyroidism   . Liver disease, chronic, with cirrhosis ([place])    ? autoimmune  . OSA on CPAP    cpap 4.5 setting  . Paroxysmal atrial fibrillation (HCC)    a. CHA2DS2VASc = 4-->coumadin.  [name] Physical deconditioning   . PPD positive, treated    rx inh   . Sleep apnea     Past Surgical History:  Procedure Laterality Date  . ABDOMINAL HYSTERECTOMY N/A 06/27/2014   Procedure: HYSTERECTOMY ABDOMINAL TOTAL;  Surgeon: Everitt Amber, MD;  [facility];  Service: Gynecology;  Laterality: N/A;  . BREAST BIOPSY     left breast  . BREAST EXCISIONAL BIOPSY Left 2002  . CATARACT EXTRACTION    . CHOLECYSTECTOMY    . EYE SURGERY    . FOOT SURGERY    . history of chemotherapy x 2 treatments, radiation tx  2014  . LAPAROTOMY N/A 06/27/2014   Procedure: EXPLORATORY LAPAROTOMY;  Surgeon: Everitt Amber, MD;  [facility];  Service: Gynecology;  Laterality: N/A;  . MASTECTOMY Right 2014  . MASTECTOMY MODIFIED RADICAL  06/08/2012   Procedure: MASTECTOMY MODIFIED RADICAL;  Surgeon: Edward Jolly, MD;  Location: [place];  Service: General;  Laterality: Right;  . pac removed  2015  . PEG TUBE PLACEMENT     peg removed 2015  . PERCUTANEOUS LIVER BIOPSY    . PORTACATH PLACEMENT  06/08/2012   Procedure: INSERTION PORT-A-CATH;  Surgeon: Edward Jolly, MD;  Location: [place];  Service: General;  Laterality: Left;  . SALPINGOOPHORECTOMY Bilateral 06/27/2014   Procedure: BILATERAL SALPINGO OOPHORECTOMY/TUMOR DEBULKING/LYMPHNODE DISSECTION, OMENTECTOMY;  Surgeon: Everitt Amber, MD;  [facility];  Service: Gynecology;  Laterality: Bilateral;  . TUBAL LIGATION      Social History   Socioeconomic History  . Marital status: Married    Spouse name: Not on file  . Number of children: 2  . Years of  education: Not on file  . Highest education level: Not on file  Occupational History  . Occupation: disability    Employer: [employer]  . Financial resource strain: Not on file  . Food insecurity:    Worry: Not on file    Inability: Not on file  . Transportation needs:    Medical: Not on file    Non-medical: Not on file  Tobacco Use  . Smoking status: Former Smoker    Packs/day: 1.00    Years: 1.00    Pack years: 1.00  . Smokeless tobacco: Former Network engineer and Sexual Activity  . Alcohol use: No  . Drug use: No    Comment: quit age 89 only smoked as a teen 1 year  . Sexual activity: Never  Lifestyle  . Physical activity:    Days per week: Not on file    Minutes per session: Not on  file  . Stress: Not on file  Relationships  . Social connections:    Talks on phone: Not on file    Gets together: Not on file    Attends religious service: Not on file    Active member of club or organization: Not on file    Attends meetings of clubs or organizations: Not on file    Relationship status: Not on file  . Intimate partner violence:    Fear of current or ex partner: Not on file    Emotionally abused: Not on file    Physically abused: Not on file    Forced sexual activity: Not on file  Other Topics Concern  . 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       Allergies as of 05/10/2018      Reactions   Olmesartan Medoxomil Cough   Adhesive [tape] Rash   Tetracycline Hcl Other (See Comments)   Venlafaxine Other (See Comments)   severe dry mouth      Medication List       Accurate as of May 10, 2018 11:59 PM. Always use your most recent med list.        acetaminophen 500 MG tablet Commonly known as:  TYLENOL Take 500 mg by mouth every 6 (six) hours as needed for moderate pain.   ALPRAZolam 0.5 MG tablet Commonly known as:  XANAX TAKE 1 TABLET BY MOUTH TWO  TIMES DAILY AS NEEDED FOR  ANXIETY   buPROPion 150 MG 24 hr tablet Commonly known as:  WELLBUTRIN XL TAKE 1 TABLET BY MOUTH  DAILY   ciprofloxacin 250 MG tablet Commonly known as:  CIPRO Take 1 tablet (250 mg total) by mouth 2 (two) times daily.   escitalopram 20 MG tablet Commonly known as:  LEXAPRO TAKE 1 TABLET BY MOUTH  EVERY MORNING   famotidine 20 MG tablet Commonly known as:  PEPCID Take 1 tablet (20 mg total) by mouth daily.   FREESTYLE LIBRE READER Devi Inject 1 Stick as directed daily. Use daily to check blood sugar. E11.9   furosemide 20 MG tablet Commonly known as:  LASIX TAKE 1 TABLET BY MOUTH   DAILY   glimepiride 2 MG tablet Commonly known as:  AMARYL TAKE 1 TABLET BY MOUTH TWO  TIMES DAILY   glucose blood test strip Commonly known as:  BAYER CONTOUR NEXT TEST Use as instructed to check pt's blood sugar twice a day.  DX E11.9   glucose blood test strip Commonly known as:  ONETOUCH VERIO USE TO CHECK BLOOD SUGAR TWICE DAILY   ketoconazole 2 % cream Commonly known as:  NIZORAL Apply 1 application topically daily.   letrozole 2.5 MG tablet Commonly known as:  FEMARA TAKE 1 TABLET BY MOUTH  DAILY   levothyroxine 150 MCG tablet Commonly known as:  SYNTHROID, LEVOTHROID TAKE 1 TABLET BY MOUTH  DAILY   lidocaine-prilocaine cream Commonly known as:  EMLA APPLY TOPICALLY OVER PORT  AREA 1-2 HOURS BEFORE  CHEMOTHERAPY AS NEEDED   metFORMIN 500 MG 24 hr tablet Commonly known as:  GLUCOPHAGE-XR TAKE 2 TABLETS BY MOUTH  EVERY EVENING   metroNIDAZOLE 1 % gel Commonly known as:  METROGEL Apply topically daily.   mirabegron ER 50 MG Tb24 tablet Commonly known as:  MYRBETRIQ Take 1 tablet (50 mg total) by mouth daily.   nadolol 40 MG tablet Commonly known as:  CORGARD Take 1 tablet (40 mg total) by mouth daily.   nystatin 100000 UNIT/ML suspension Commonly known as:  MYCOSTATIN Take 5 mLs (500,000 Units total) by mouth 4 (four) times daily.   OLANZapine 2.5 MG tablet Commonly known as:  ZYPREXA TAKE 1 TABLET BY MOUTH  NIGHTLY AT BEDTIME   omeprazole 20 MG capsule Commonly known as:  PRILOSEC Take 1 capsule (20 mg total) by mouth daily.   ondansetron 8 MG tablet Commonly known as:  ZOFRAN Take 1 tablet (8 mg total) by mouth every 8 (eight) hours as needed for nausea or vomiting.   ONETOUCH DELICA LANCETS 25K Misc USE TO CHECK BLOOD SUGAR TWICE DAILY   oxybutynin 5 MG tablet Commonly known as:  DITROPAN Take 1 tablet (5 mg total) by mouth 3 (three) times daily.   potassium chloride 10 MEQ tablet Commonly known as:  K-DUR,KLOR-CON Take 1 tablet (10 mEq  total) by mouth 2 (two) times daily.   pravastatin 20 MG tablet Commonly known as:  PRAVACHOL TAKE 1 TABLET BY MOUTH AT  BEDTIME   propranolol 20 MG tablet Commonly known as:  INDERAL TAKE 1 TABLET BY MOUTH TWO  TIMES DAILY   solifenacin 10 MG tablet Commonly known as:  VESICARE Take 1 tablet (10 mg total) by mouth at bedtime.           Objective:   Physical Exam BP 118/76 (BP Location: Left Arm, Patient Position: Sitting, Cuff Size: Small)   Pulse 71   Temp 97.8 F (36.6 C) (Oral)   Resp 16   Ht 5' 5.5" (1.664 m)   Wt 161 lb 4 oz (73.1 kg)   SpO2 98%   BMI 26.43 kg/m  General:   Well developed, chronically ill-appearing, nontoxic, no acute distress  HEENT:  Normocephalic . Face symmetric, atraumatic. Nose not congested.  TMs normal Throat erythematous throughout, she had thick white discharge in a patchy way throughout the throat mostly on the left.  Was not able to swallow the d/c x2, she was eventually able to clear the throat from the white discharge after she had some water. No blisters.  No trismus.  No difficulty swallowing.  Tongue: No swelling, minimal erythema, no lesions. Lungs:  CTA B Normal respiratory effort, no intercostal retractions, no accessory muscle use. Heart: RRR,  no murmur.  Skin: Not pale. Not jaundice Neurologic:  alert &  oriented X3.  Speech normal, gait appropriate for age and unassisted Psych--  Cognition and judgment appear intact.  Cooperative with normal attention span and concentration.  Behavior appropriate. No anxious or depressed appearing.      Assessment    62 year old female,PMH includes a history of breast cancer, CHF, clear cell ovarian cancer, diabetes, liver dz, GERD, OSA on CPAP, paroxysmal A. fib.  On multiple medications presents with  Pharyngitis: Complaining of sore throat, exam showed erythematous throat with thick patchy discharge, she also has "tongue pain", the tongue is slightly erythematous.  All this in  the context of taking ciprofloxacin. Rapid strep test negative Throat culture pending Suspect symptoms could be thrush related, will treat with nystatin. Advised patient to have a prompt reevaluation if symptoms escalate. I will asked the patient to come back for a recheck on 05/13/2018

## 2018-05-10 NOTE — Progress Notes (Signed)
Pre visit review using our clinic review tool, if applicable. No additional management support is needed unless otherwise documented below in the visit note. 

## 2018-05-13 ENCOUNTER — Ambulatory Visit (INDEPENDENT_AMBULATORY_CARE_PROVIDER_SITE_OTHER): Payer: Medicare Other | Admitting: Internal Medicine

## 2018-05-13 ENCOUNTER — Encounter: Payer: Self-pay | Admitting: Internal Medicine

## 2018-05-13 VITALS — BP 122/74 | HR 61 | Temp 98.0°F | Resp 16 | Ht 65.5 in | Wt 161.2 lb

## 2018-05-13 DIAGNOSIS — J029 Acute pharyngitis, unspecified: Secondary | ICD-10-CM | POA: Diagnosis not present

## 2018-05-13 LAB — CULTURE, GROUP A STREP
MICRO NUMBER:: 91550795
SPECIMEN QUALITY:: ADEQUATE

## 2018-05-13 MED ORDER — NYSTATIN 100000 UNIT/ML MT SUSP: 10.0000 mL | Freq: Four times a day (QID) | OROMUCOSAL | 0 refills | Status: DC

## 2018-05-13 NOTE — Progress Notes (Signed)
Pre visit review using our clinic review tool, if applicable. No additional management support is needed unless otherwise documented below in the visit note. 

## 2018-05-13 NOTE — Patient Instructions (Signed)
Continue using nystatin, 2 teaspoon 4 times a day, swish and spit.  Please see your primary doctor next week

## 2018-05-13 NOTE — Progress Notes (Signed)
Subjective:    Patient ID: [name], female    DOB: [birth year], 63 y.o.   MRN: [id number]  DOS:  05/13/2018 Type of visit - description: Follow-up Since the last office visit, she is using nystatin as recommended. She finished ciprofloxacin Overall, she feels better.   Review of Systems Denies fever chills No difficulty swallowing She is drinking plenty of fluids Left sore throat resolved.  Past Medical History:  Diagnosis Date  . Allergy   . Anemia    low iron hx  . Arthritis   . Breast cancer ([place]) 2014   a. 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 Cataract    removed ou  . Chronic combined systolic and diastolic CHF (congestive heart failure) ([place])    a. 08/2012 Echo: EF 55-60%, no rwma, mod MR;  b. 07/2014 Echo: EF 45-50%, distal antsept HK, mod TR/MR, mildly bil-atrial enlargement.  . Clear Cell Ovarian Cancer    a. 06/2014 s/p TAH/BSO debulking/lymph node dissection;  b. Now on chemo.  . Depression   . Diabetes mellitus   . DJD (degenerative joint disease) of lumbar spine   . DVT (deep venous thrombosis) (HCC)    hx of on HRT left leg ~2006  . Gallstones   . GERD (gastroesophageal reflux disease)   . Hyperlipidemia   . Hypertension   . Hypothyroidism   . Liver disease, chronic, with cirrhosis ([place])    ? autoimmune  . OSA on CPAP    cpap 4.5 setting  . Paroxysmal atrial fibrillation (HCC)    a. CHA2DS2VASc = 4-->coumadin.  [name] Physical deconditioning   . PPD positive, treated    rx inh   . Sleep apnea     Past Surgical History:  Procedure Laterality Date  . ABDOMINAL HYSTERECTOMY N/A 06/27/2014   Procedure: HYSTERECTOMY ABDOMINAL TOTAL;  Surgeon: Everitt Amber, MD;  [facility];  Service: Gynecology;  Laterality: N/A;  . BREAST BIOPSY     left breast  . BREAST EXCISIONAL BIOPSY Left 2002  . CATARACT EXTRACTION    . CHOLECYSTECTOMY    . EYE  SURGERY    . FOOT SURGERY    . history of chemotherapy x 2 treatments, radiation tx  2014  . LAPAROTOMY N/A 06/27/2014   Procedure: EXPLORATORY LAPAROTOMY;  Surgeon: Everitt Amber, MD;  [facility];  Service: Gynecology;  Laterality: N/A;  . MASTECTOMY Right 2014  . MASTECTOMY MODIFIED RADICAL  06/08/2012   Procedure: MASTECTOMY MODIFIED RADICAL;  Surgeon: Edward Jolly, MD;  Location: [facility];  Service: General;  Laterality: Right;  . pac removed  2015  . PEG TUBE PLACEMENT     peg removed 2015  . PERCUTANEOUS LIVER BIOPSY    . PORTACATH PLACEMENT  06/08/2012   Procedure: INSERTION PORT-A-CATH;  Surgeon: Edward Jolly, MD;  Location: [facility];  Service: General;  Laterality: Left;  . SALPINGOOPHORECTOMY Bilateral 06/27/2014   Procedure: BILATERAL SALPINGO OOPHORECTOMY/TUMOR DEBULKING/LYMPHNODE DISSECTION, OMENTECTOMY;  Surgeon: Everitt Amber, MD;  [facility];  Service: Gynecology;  Laterality: Bilateral;  . TUBAL LIGATION      Social History   Socioeconomic History  . Marital status: Married    Spouse name: Not on file  . Number of children: 2  . Years of education: Not on file  . Highest education level: Not on file  Occupational History  . Occupation: disability  Employer: [employer]  Social Needs  . Financial resource strain: Not on file  . Food insecurity:    Worry: Not on file    Inability: Not on file  . Transportation needs:    Medical: Not on file    Non-medical: Not on file  Tobacco Use  . Smoking status: Former Smoker    Packs/day: 1.00    Years: 1.00    Pack years: 1.00  . Smokeless tobacco: Former Network engineer and Sexual Activity  . Alcohol use: No  . Drug use: No    Comment: quit age 88 only smoked as a teen 1 year  . Sexual activity: Never  Lifestyle  . Physical activity:    Days per week: Not on file    Minutes per session: Not on file  . Stress: Not on file  Relationships  . Social connections:    Talks on phone: Not on file     Gets together: Not on file    Attends religious service: Not on file    Active member of club or organization: Not on file    Attends meetings of clubs or organizations: Not on file    Relationship status: Not on file  . Intimate partner violence:    Fear of current or ex partner: Not on file    Emotionally abused: Not on file    Physically abused: Not on file    Forced sexual activity: Not on file  Other Topics Concern  . 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       Allergies as of 05/13/2018      Reactions   Olmesartan Medoxomil Cough   Adhesive [tape] Rash   Tetracycline Hcl Other (See Comments)   Venlafaxine Other (See Comments)   severe dry mouth      Medication List       Accurate as of May 13, 2018 11:21 AM. Always use your most recent med list.        acetaminophen 500 MG tablet Commonly known as:  TYLENOL Take 500 mg by mouth every 6 (six) hours as needed for moderate pain.   ALPRAZolam 0.5 MG tablet Commonly known as:  XANAX TAKE 1 TABLET BY MOUTH TWO  TIMES DAILY AS NEEDED FOR  ANXIETY   buPROPion 150 MG 24 hr tablet Commonly known as:  WELLBUTRIN XL TAKE 1 TABLET BY MOUTH  DAILY   ciprofloxacin 250 MG tablet Commonly known as:  CIPRO Take 1 tablet (250 mg total) by mouth 2 (two) times daily.   escitalopram 20 MG tablet Commonly known as:  LEXAPRO TAKE 1 TABLET BY MOUTH  EVERY MORNING   famotidine 20 MG tablet Commonly known as:  PEPCID Take 1 tablet (20 mg total) by mouth daily.   FREESTYLE LIBRE READER Devi Inject 1 Stick as directed daily. Use daily to check blood sugar. E11.9   furosemide 20 MG tablet Commonly known as:  LASIX TAKE 1 TABLET BY MOUTH  DAILY   glimepiride 2 MG tablet Commonly known as:  AMARYL TAKE 1 TABLET BY MOUTH TWO  TIMES DAILY     glucose blood test strip Commonly known as:  BAYER CONTOUR NEXT TEST Use as instructed to  check pt's blood sugar twice a day.  DX E11.9   glucose blood test strip Commonly known as:  ONETOUCH VERIO USE TO CHECK BLOOD SUGAR TWICE DAILY   ketoconazole 2 % cream Commonly known as:  NIZORAL Apply 1 application topically daily.   letrozole 2.5 MG tablet Commonly known as:  FEMARA TAKE 1 TABLET BY MOUTH  DAILY   levothyroxine 150 MCG tablet Commonly known as:  SYNTHROID, LEVOTHROID TAKE 1 TABLET BY MOUTH  DAILY   lidocaine-prilocaine cream Commonly known as:  EMLA APPLY TOPICALLY OVER PORT  AREA 1-2 HOURS BEFORE  CHEMOTHERAPY AS NEEDED   metFORMIN 500 MG 24 hr tablet Commonly known as:  GLUCOPHAGE-XR TAKE 2 TABLETS BY MOUTH  EVERY EVENING   metroNIDAZOLE 1 % gel Commonly known as:  METROGEL Apply topically daily.   mirabegron ER 50 MG Tb24 tablet Commonly known as:  MYRBETRIQ Take 1 tablet (50 mg total) by mouth daily.   nadolol 40 MG tablet Commonly known as:  CORGARD Take 1 tablet (40 mg total) by mouth daily.   nystatin 100000 UNIT/ML suspension Commonly known as:  MYCOSTATIN Take 5 mLs (500,000 Units total) by mouth 4 (four) times daily.   OLANZapine 2.5 MG tablet Commonly known as:  ZYPREXA TAKE 1 TABLET BY MOUTH  NIGHTLY AT BEDTIME   omeprazole 20 MG capsule Commonly known as:  PRILOSEC Take 1 capsule (20 mg total) by mouth daily.   ondansetron 8 MG tablet Commonly known as:  ZOFRAN Take 1 tablet (8 mg total) by mouth every 8 (eight) hours as needed for nausea or vomiting.   ONETOUCH DELICA LANCETS 19F Misc USE TO CHECK BLOOD SUGAR TWICE DAILY   oxybutynin 5 MG tablet Commonly known as:  DITROPAN Take 1 tablet (5 mg total) by mouth 3 (three) times daily.   potassium chloride 10 MEQ tablet Commonly known as:  K-DUR,KLOR-CON Take 1 tablet (10 mEq total) by mouth 2 (two) times daily.   pravastatin 20 MG tablet Commonly known as:  PRAVACHOL TAKE 1  TABLET BY MOUTH AT  BEDTIME   propranolol 20 MG tablet Commonly known as:  INDERAL TAKE 1 TABLET BY MOUTH TWO  TIMES DAILY   solifenacin 10 MG tablet Commonly known as:  VESICARE Take 1 tablet (10 mg total) by mouth at bedtime.           Objective:   Physical Exam BP 122/74 (BP Location: Left Wrist, Patient Position: Sitting, Cuff Size: Small)   Pulse 61   Temp 98 F (36.7 C) (Oral)   Resp 16   Ht 5' 5.5" (1.664 m)   Wt 161 lb 4 oz (73.1 kg)   SpO2 98%   BMI 26.43 kg/m  General:   Well developed, NAD, BMI noted. HEENT:  Normocephalic . Face symmetric, atraumatic Throat: Previously seen redness, decreased.  Tongue--  seems to be slightly irritated with a couple of white patches ( easily removed with a tongue depressor);  see picture (although I don't think irritation is well seen on the pic) . There is a lot of debris around the teeth and the membranes are noted to be very dry. Neurologic:  alert & oriented X3.  Speech normal  Psych--  Cognition and judgment appear intact.  Cooperative with normal attention span and concentration.  Behavior appropriate. No anxious or depressed appearing.        Assessment     63 year old female,PMH includes a history of breast cancer, CHF, clear cell ovarian cancer, diabetes, liver dz, GERD,  OSA on CPAP, paroxysmal A. fib.  On multiple medications presents with   Pharyngitis: See last visit, throat culture was negative, she was treated empirically with nystatin. Oral  membranes look better, still have pain and burning on the tongue. I do think she has thrush on clinical grounds, also dry mouth may be playing a role in her symptoms. Recommend to continue with nystatin, increase from 5 to 10 days, increase mouth hygiene; rec brushing   3 times a day, flossing 3 times a day and continue drinking plenty of fluids. She has an appointment to see PCP next week, they should currently reassess then.

## 2018-05-17 ENCOUNTER — Inpatient Hospital Stay: Payer: Medicare Other

## 2018-05-17 ENCOUNTER — Other Ambulatory Visit: Payer: Self-pay | Admitting: Oncology

## 2018-05-17 ENCOUNTER — Other Ambulatory Visit: Payer: Self-pay | Admitting: Family Medicine

## 2018-05-17 ENCOUNTER — Inpatient Hospital Stay: Payer: Medicare Other | Attending: Oncology

## 2018-05-17 DIAGNOSIS — C561 Malignant neoplasm of right ovary: Secondary | ICD-10-CM

## 2018-05-17 DIAGNOSIS — C562 Malignant neoplasm of left ovary: Secondary | ICD-10-CM | POA: Diagnosis not present

## 2018-05-17 DIAGNOSIS — C772 Secondary and unspecified malignant neoplasm of intra-abdominal lymph nodes: Secondary | ICD-10-CM

## 2018-05-17 DIAGNOSIS — C569 Malignant neoplasm of unspecified ovary: Secondary | ICD-10-CM

## 2018-05-17 DIAGNOSIS — R599 Enlarged lymph nodes, unspecified: Secondary | ICD-10-CM | POA: Diagnosis not present

## 2018-05-17 DIAGNOSIS — R970 Elevated carcinoembryonic antigen [CEA]: Secondary | ICD-10-CM | POA: Diagnosis not present

## 2018-05-17 DIAGNOSIS — Z95828 Presence of other vascular implants and grafts: Secondary | ICD-10-CM

## 2018-05-17 DIAGNOSIS — Z170 Estrogen receptor positive status [ER+]: Secondary | ICD-10-CM | POA: Diagnosis not present

## 2018-05-17 DIAGNOSIS — M818 Other osteoporosis without current pathological fracture: Secondary | ICD-10-CM

## 2018-05-17 DIAGNOSIS — M810 Age-related osteoporosis without current pathological fracture: Secondary | ICD-10-CM | POA: Diagnosis not present

## 2018-05-17 DIAGNOSIS — Z87891 Personal history of nicotine dependence: Secondary | ICD-10-CM | POA: Insufficient documentation

## 2018-05-17 DIAGNOSIS — C50811 Malignant neoplasm of overlapping sites of right female breast: Secondary | ICD-10-CM | POA: Diagnosis not present

## 2018-05-17 DIAGNOSIS — R911 Solitary pulmonary nodule: Secondary | ICD-10-CM | POA: Diagnosis not present

## 2018-05-17 LAB — COMPREHENSIVE METABOLIC PANEL
ALBUMIN: 3 g/dL — AB (ref 3.5–5.0)
ALK PHOS: 129 U/L — AB (ref 38–126)
ALT: 16 U/L (ref 0–44)
ANION GAP: 10 (ref 5–15)
AST: 40 U/L (ref 15–41)
BUN: 5 mg/dL — ABNORMAL LOW (ref 8–23)
CO2: 23 mmol/L (ref 22–32)
CREATININE: 0.59 mg/dL (ref 0.44–1.00)
Calcium: 8.4 mg/dL — ABNORMAL LOW (ref 8.9–10.3)
Chloride: 108 mmol/L (ref 98–111)
GFR calc Af Amer: >60 mL/min (ref >60)
GFR calc non Af Amer: >60 mL/min (ref >60)
GLUCOSE: 76 mg/dL (ref 70–99)
POTASSIUM: 3.4 mmol/L — AB (ref 3.5–5.1)
SODIUM: 141 mmol/L (ref 135–145)
Total Bilirubin: 2 mg/dL — ABNORMAL HIGH (ref 0.3–1.2)
Total Protein: 6.7 g/dL (ref 6.5–8.1)

## 2018-05-17 LAB — CBC WITH DIFFERENTIAL/PLATELET
Abs Immature Granulocytes: 0.01 10*3/uL (ref 0.00–0.07)
BASOS ABS: 0 10*3/uL (ref 0.0–0.1)
Basophils Relative: 0 %
EOS PCT: 4 %
Eosinophils Absolute: 0.1 10*3/uL (ref 0.0–0.5)
HEMATOCRIT: 35.7 % — AB (ref 36.0–46.0)
Hemoglobin: 11.8 g/dL — ABNORMAL LOW (ref 12.0–15.0)
Immature Granulocytes: 0 %
LYMPHS ABS: 0.9 10*3/uL (ref 0.7–4.0)
Lymphocytes Relative: 27 %
MCH: 33.6 pg (ref 26.0–34.0)
MCHC: 33.1 g/dL (ref 30.0–36.0)
MCV: 101.7 fL — ABNORMAL HIGH (ref 80.0–100.0)
MONOS PCT: 8 %
Monocytes Absolute: 0.3 10*3/uL (ref 0.1–1.0)
NEUTROS PCT: 61 %
NRBC: 0 % (ref 0.0–0.2)
Neutro Abs: 2.1 10*3/uL (ref 1.7–7.7)
Platelets: 111 10*3/uL — ABNORMAL LOW (ref 150–400)
RBC: 3.51 MIL/uL — ABNORMAL LOW (ref 3.87–5.11)
RDW: 14.8 % (ref 11.5–15.5)
WBC: 3.5 10*3/uL — ABNORMAL LOW (ref 4.0–10.5)

## 2018-05-17 MED ORDER — HEPARIN SOD (PORK) LOCK FLUSH 100 UNIT/ML IV SOLN
500.0000 [IU] | Freq: Once | INTRAVENOUS | Status: AC
  Administered 2018-05-17: 500 [IU]
  Filled 2018-05-17: qty 5

## 2018-05-17 MED ORDER — SODIUM CHLORIDE 0.9% FLUSH
10.0000 mL | Freq: Once | INTRAVENOUS | Status: AC
  Administered 2018-05-17: 10 mL
  Filled 2018-05-17: qty 10

## 2018-05-17 NOTE — Progress Notes (Signed)
Subjective:   [name] is a 63 y.o. female who presents for Medicare Annual (Subsequent) preventive examination.  Review of Systems: No ROS.  Medicare Wellness Visit. Additional risk factors are reflected in the social history. Cardiac Risk Factors include: advanced age (>22mn, >>6women);diabetes mellitus;dyslipidemia;hypertension;sedentary lifestyle   Sleep patterns: sleeps 8-9 hrs per night.  Home Safety/Smoke Alarms: Feels safe in home. Smoke alarms in place.   Lives with husband. One story with wheelchair ramp. Step-over tub with bench and grab rail.  Female:       Mammo- utd      Dexa scan- utd       [facility]- declines Eye-appt next Tuesday.    Objective:     Vitals: BP (!) 100/54 (BP Location: Left Arm, Patient Position: Sitting, Cuff Size: Normal)   Pulse 65   Ht '5\' 6"'  (1.676 m)   Wt 149 lb 9.6 oz (67.9 kg)   SpO2 98%   BMI 24.15 kg/m   Body mass index is 24.15 kg/m.  Advanced Directives 05/18/2018 07/09/2017 05/21/2016 05/09/2016 02/04/2016 11/05/2015 03/26/2015  Does Patient Have a Medical Advance Directive? Yes No No No No No Yes  Type of AParamedicof ASaltilloLiving will - - - - - HPress photographerLiving will  Does patient want to make changes to medical advance directive? No - Patient declined - - - - - No - Patient declined  Copy of HClarein Chart? Yes - validated most recent copy scanned in chart (See row information) - - - - - No - copy requested  Would patient like information on creating a medical advance directive? - No - Patient declined - Yes (MAU/Ambulatory/Procedural Areas - Information given) - - -  Pre-existing out of facility DNR order (yellow form or pink MOST form) - - - - - - -    Tobacco Social History   Tobacco Use  Smoking Status Former Smoker  . Packs/day: 1.00  . Years: 1.00  . Pack years: 1.00  Smokeless Tobacco Former UEngineer, structuralgiven: Not Answered   Clinical  Intake: Pain : No/denies pain    Past Medical History:  Diagnosis Date  . Allergy   . Anemia    low iron hx  . Arthritis   . Breast cancer [place] [place]   a. 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 Cataract    removed ou  . Chronic combined systolic and diastolic CHF (congestive heart failure) ([facility]    a. 08/2012 Echo: EF 55-60%, no rwma, mod MR;  b. 07/2014 Echo: EF 45-50%, distal antsept HK, mod TR/MR, mildly bil-atrial enlargement.  . Clear Cell Ovarian Cancer    a. 06/2014 s/p TAH/BSO debulking/lymph node dissection;  b. Now on chemo.  . Depression   . Diabetes mellitus   . DJD (degenerative joint disease) of lumbar spine   . DVT (deep venous thrombosis) (HCC)    hx of on HRT left leg ~2006  . Gallstones   . GERD (gastroesophageal reflux disease)   . Hyperlipidemia   . Hypertension   . Hypothyroidism   . Liver disease, chronic, with cirrhosis ([facility]    ? autoimmune  . OSA on CPAP    cpap 4.5 setting  . Paroxysmal atrial fibrillation (HCC)    a. CHA2DS2VASc = 4-->coumadin.  .Marland KitchenPhysical deconditioning   . PPD positive,  treated    rx inh   . Sleep apnea    Past Surgical History:  Procedure Laterality Date  . ABDOMINAL HYSTERECTOMY N/A 06/27/2014   Procedure: HYSTERECTOMY ABDOMINAL TOTAL;  Surgeon: Everitt Amber, MD;  Location: [facility];  Service: Gynecology;  Laterality: N/A;  . BREAST BIOPSY     left breast  . BREAST EXCISIONAL BIOPSY Left 2002  . CATARACT EXTRACTION    . CHOLECYSTECTOMY    . EYE SURGERY    . FOOT SURGERY    . history of chemotherapy x 2 treatments, radiation tx  2014  . LAPAROTOMY N/A 06/27/2014   Procedure: EXPLORATORY LAPAROTOMY;  Surgeon: Everitt Amber, MD;  Location: [facility];  Service: Gynecology;  Laterality: N/A;  . MASTECTOMY Right 2014  . MASTECTOMY MODIFIED RADICAL  06/08/2012   Procedure: MASTECTOMY MODIFIED RADICAL;  Surgeon: Edward Jolly, MD;  Location: [place];  Service: General;  Laterality: Right;  . pac removed  2015  . PEG TUBE PLACEMENT     peg removed 2015  . PERCUTANEOUS LIVER BIOPSY    . PORTACATH PLACEMENT  06/08/2012   Procedure: INSERTION PORT-A-CATH;  Surgeon: Edward Jolly, MD;  Location: [place];  Service: General;  Laterality: Left;  . SALPINGOOPHORECTOMY Bilateral 06/27/2014   Procedure: BILATERAL SALPINGO OOPHORECTOMY/TUMOR DEBULKING/LYMPHNODE DISSECTION, OMENTECTOMY;  Surgeon: Everitt Amber, MD;  Location: [facility];  Service: Gynecology;  Laterality: Bilateral;  . TUBAL LIGATION     Family History  Problem Relation Age of Onset  . Diabetes Mother   . Hypertension Mother   . Arthritis Mother   . Heart disease Mother   . Heart failure Mother   . Other Mother        benign breast mass  . Stroke Father   . Heart disease Father   . Dementia Father   . Diabetes Paternal Grandmother   . Colon cancer Paternal Grandfather        dx. 70s-80s  . Colon cancer Maternal Grandfather   . Other Daughter        potentially has had negative BRCA testing  . Other Daughter        hysterectomy; potentially has had negative genetic testing  . Rectal cancer Neg Hx   . Esophageal cancer Neg Hx   . Liver cancer Neg Hx    Social History   Socioeconomic History  . Marital status: Married    Spouse name: Not on file  . Number of children: 2  . Years of education: Not on file  . Highest education level: Not on file  Occupational History  . Occupation: disability    Employer: [employer]  . Financial resource strain: Not on file  . Food insecurity:    Worry: Not on file    Inability: Not on file  . Transportation needs:    Medical: Not on file    Non-medical: Not on file  Tobacco Use  . Smoking status: Former Smoker    Packs/day: 1.00    Years: 1.00    Pack years: 1.00  . Smokeless tobacco: Former Network engineer and Sexual Activity  . Alcohol use: No  . Drug use: No    Comment:  quit age 70 only smoked as a teen 1 year  . Sexual activity: Never  Lifestyle  . Physical activity:    Days per week: Not on file    Minutes per session: Not on file  . Stress: Not on file  Relationships  .  Social connections:    Talks on phone: Not on file    Gets together: Not on file    Attends religious service: Not on file    Active member of club or organization: Not on file    Attends meetings of clubs or organizations: Not on file    Relationship status: Not on file  Other Topics Concern  . 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     Outpatient Encounter Medications as of 05/18/2018  Medication Sig  . acetaminophen (TYLENOL) 500 MG tablet Take 500 mg by mouth every 6 (six) hours as needed for moderate pain.  [name] ALPRAZolam (XANAX) 0.5 MG tablet TAKE 1 TABLET BY MOUTH TWO  TIMES DAILY AS NEEDED FOR  ANXIETY  . buPROPion (WELLBUTRIN XL) 150 MG 24 hr tablet TAKE 1 TABLET BY MOUTH  DAILY  . escitalopram (LEXAPRO) 20 MG tablet TAKE 1 TABLET BY MOUTH  EVERY MORNING  . furosemide (LASIX) 20 MG tablet TAKE 1 TABLET BY MOUTH  DAILY  . glimepiride (AMARYL) 2 MG tablet TAKE 1 TABLET BY MOUTH TWO  TIMES DAILY  . ketoconazole (NIZORAL) 2 % cream Apply 1 application topically daily.  [name] letrozole (FEMARA) 2.5 MG tablet TAKE 1 TABLET BY MOUTH  DAILY  . levothyroxine (SYNTHROID, LEVOTHROID) 150 MCG tablet TAKE 1 TABLET BY MOUTH  DAILY  . lidocaine-prilocaine (EMLA) cream APPLY TOPICALLY OVER PORT  AREA 1-2 HOURS BEFORE  CHEMOTHERAPY AS NEEDED  . metFORMIN (GLUCOPHAGE-XR) 500 MG 24 hr tablet TAKE 2 TABLETS BY MOUTH  EVERY EVENING  . metroNIDAZOLE (METROGEL) 1 % gel Apply topically daily.  . mirabegron ER (MYRBETRIQ) 50 MG TB24 tablet Take 1 tablet (50 mg total) by mouth daily.  . nadolol (CORGARD) 40 MG tablet Take 1  tablet (40 mg total) by mouth daily.  [name] nystatin (MYCOSTATIN) 100000 UNIT/ML suspension Take 10 mLs (1,000,000 Units total) by mouth 4 (four) times daily.  [name] OLANZapine (ZYPREXA) 2.5 MG tablet TAKE 1 TABLET BY MOUTH  NIGHTLY AT BEDTIME  . ondansetron (ZOFRAN) 8 MG tablet Take 1 tablet (8 mg total) by mouth every 8 (eight) hours as needed for nausea or vomiting.  [name] oxybutynin (DITROPAN) 5 MG tablet Take 1 tablet (5 mg total) by mouth 3 (three) times daily.  . potassium chloride (K-DUR,KLOR-CON) 10 MEQ tablet Take 1 tablet (10 mEq total) by mouth 2 (two) times daily.  . pravastatin (PRAVACHOL) 20 MG tablet TAKE 1 TABLET BY MOUTH AT  BEDTIME  . propranolol (INDERAL) 20 MG tablet TAKE 1 TABLET BY MOUTH TWO  TIMES DAILY  . solifenacin (VESICARE) 10 MG tablet Take 1 tablet (10 mg total) by mouth at bedtime.  . Continuous Blood Gluc Receiver (FREESTYLE LIBRE READER) DEVI Inject 1 Stick as directed daily. Use daily to check blood sugar. E11.9 (Patient not taking: Reported on 05/18/2018)  . famotidine (PEPCID) 20 MG tablet Take 1 tablet (20 mg total) by mouth daily. (Patient not taking: Reported on 05/18/2018)  . glucose blood (BAYER CONTOUR NEXT TEST) test strip Use as instructed to check pt's blood sugar twice a day.  DX E11.9 (Patient not taking: Reported on 05/18/2018)  . glucose blood (ONETOUCH VERIO) test strip USE TO CHECK BLOOD SUGAR  TWICE DAILY (Patient not taking: Reported on 05/18/2018)  . omeprazole (PRILOSEC) 20 MG capsule Take 1 capsule (20 mg total) by mouth daily. (Patient not taking: Reported on 05/18/2018)  . ONETOUCH DELICA LANCETS 11B MISC USE TO CHECK BLOOD SUGAR TWICE DAILY (Patient not taking: Reported on 05/18/2018)   No facility-administered encounter medications on file as of 05/18/2018.     Activities of Daily Living In your present state of health, do you have any difficulty performing the following activities: 05/18/2018 05/13/2018  Hearing? N N  Vision? N N  Difficulty concentrating or  making decisions? N N  Walking or climbing stairs? Y Y  Dressing or bathing? Y Y  Doing errands, shopping? Tempie Donning  Preparing Food and eating ? Y -  Using the Toilet? N -  In the past six months, have you accidently leaked urine? Y -  Do you have problems with loss of bowel control? N -  Managing your Medications? Y -  Managing your Finances? Y -  Housekeeping or managing your Housekeeping? Y -  Some recent data might be hidden    Patient Care Team: Carollee Herter, Alferd Apa, DO as PCP - General (Family Medicine) Magrinat, Virgie Dad, MD as Consulting Physician (Oncology) Excell Seltzer, MD as Consulting Physician (General Surgery) Everitt Amber, MD as Consulting Physician (Obstetrics and Gynecology) Gaynelle Arabian, MD as Consulting Physician (Orthopedic Surgery) Armbruster, Carlota Raspberry, MD as Consulting Physician (Gastroenterology)    Assessment:   This is a routine wellness examination for [place]. Physical assessment deferred to PCP.  Exercise Activities and Dietary recommendations Current Exercise Habits: The patient does not participate in regular exercise at present, Exercise limited by: None identified Diet (meal preparation, eat out, water intake, caffeinated beverages, dairy products, fruits and vegetables): on average, 2 meals per day Proper nutrition    Goals    . MAINTAIN HEALTHY WT    . Weight (lb) < 200 lb (90.7 kg)       Fall Risk Fall Risk  05/18/2018 05/09/2016  Falls in the past year? 0 No    Depression Screen PHQ 2/9 Scores 05/18/2018 05/09/2016  PHQ - 2 Score 0 0     Cognitive Function MMSE - Mini Mental State Exam 05/18/2018 05/09/2016  Orientation to time 5 5  Orientation to Place 5 5  Registration 3 3  Attention/ Calculation 0 5  Recall 1 2  Language- name 2 objects 2 2  Language- repeat 1 1  Language- follow 3 step command 3 3  Language- read & follow direction 1 1  Write a sentence 1 1  Copy design 1 0  Total score 23 28        Immunization  History  Administered Date(s) Administered  . Hep A / Hep B 06/22/2017  . Hepatitis A 04/03/2009, 03/27/2010  . Hepatitis A, Adult 12/21/2017  . Hepatitis B 07/01/2001, 08/08/2001, 01/17/2002  . Hepatitis B, ped/adol 12/10/2016, 01/09/2017, 01/09/2017  . Influenza Split 03/29/2012  . Influenza,inj,Quad PF,6+ Mos 02/02/2013, 01/24/2014, 04/24/2015, 01/28/2016, 02/20/2017, 03/08/2018  . Pneumococcal Polysaccharide-23 11/22/2008, 05/15/2014  . Td 05/12/2006  . Zoster Recombinat (Shingrix) 10/10/2016, 12/16/2016   Screening Tests Health Maintenance  Topic Date Due  . HIV Screening  03/02/1971  . COLONOSCOPY  03/02/2004  . TETANUS/TDAP  05/12/2016  . URINE MICROALBUMIN  10/25/2016  . HEMOGLOBIN A1C  08/21/2017  . OPHTHALMOLOGY EXAM  10/23/2017  . FOOT EXAM  02/20/2018  . MAMMOGRAM  08/15/2019  . INFLUENZA VACCINE  Completed  . Hepatitis  C Screening  Completed      Plan:    Please schedule your next medicare wellness visit with me in 1 yr.  Continue to eat heart healthy diet (full of fruits, vegetables, whole grains, lean protein, water--limit salt, fat, and sugar intake) and increase physical activity as tolerated.  Continue doing brain stimulating activities (puzzles, reading, adult coloring books, staying active) to keep memory sharp.    I have personally reviewed and noted the following in the patient's chart:   . Medical and social history . Use of alcohol, tobacco or illicit drugs  . Current medications and supplements . Functional ability and status . Nutritional status . Physical activity . Advanced directives . List of other physicians . Hospitalizations, surgeries, and ER visits in previous 12 months . Vitals . Screenings to include cognitive, depression, and falls . Referrals and appointments  In addition, I have reviewed and discussed with patient certain preventive protocols, quality metrics, and best practice recommendations. A written personalized care plan  for preventive services as well as general preventive health recommendations were provided to patient.     Shela Nevin, [place]  05/18/2018

## 2018-05-18 ENCOUNTER — Encounter: Payer: Self-pay | Admitting: *Deleted

## 2018-05-18 ENCOUNTER — Encounter: Payer: Self-pay | Admitting: Family Medicine

## 2018-05-18 ENCOUNTER — Ambulatory Visit (INDEPENDENT_AMBULATORY_CARE_PROVIDER_SITE_OTHER): Payer: Medicare Other | Admitting: *Deleted

## 2018-05-18 ENCOUNTER — Ambulatory Visit (INDEPENDENT_AMBULATORY_CARE_PROVIDER_SITE_OTHER): Payer: Medicare Other | Admitting: Family Medicine

## 2018-05-18 VITALS — BP 100/54 | HR 66 | Temp 98.9°F | Resp 16 | Ht 65.0 in | Wt 149.0 lb

## 2018-05-18 VITALS — BP 100/54 | HR 65 | Ht 66.0 in | Wt 149.6 lb

## 2018-05-18 DIAGNOSIS — Z0001 Encounter for general adult medical examination with abnormal findings: Secondary | ICD-10-CM

## 2018-05-18 DIAGNOSIS — E785 Hyperlipidemia, unspecified: Secondary | ICD-10-CM

## 2018-05-18 DIAGNOSIS — E1165 Type 2 diabetes mellitus with hyperglycemia: Secondary | ICD-10-CM

## 2018-05-18 DIAGNOSIS — Z0000 Encounter for general adult medical examination without abnormal findings: Secondary | ICD-10-CM | POA: Diagnosis not present

## 2018-05-18 DIAGNOSIS — E1169 Type 2 diabetes mellitus with other specified complication: Secondary | ICD-10-CM | POA: Diagnosis not present

## 2018-05-18 DIAGNOSIS — N390 Urinary tract infection, site not specified: Secondary | ICD-10-CM

## 2018-05-18 DIAGNOSIS — B370 Candidal stomatitis: Secondary | ICD-10-CM

## 2018-05-18 LAB — CANCER ANTIGEN 27.29: CAN 27.29: 39.7 U/mL — AB (ref 0.0–38.6)

## 2018-05-18 LAB — CA 125: Cancer Antigen (CA) 125: 193 U/mL — ABNORMAL HIGH (ref 0.0–38.1)

## 2018-05-18 MED ORDER — ONETOUCH DELICA LANCETS 33G MISC: 1 refills | Status: DC

## 2018-05-18 MED ORDER — CLOTRIMAZOLE 10 MG MT TROC: 10.0000 mg | Freq: Three times a day (TID) | OROMUCOSAL | 0 refills | Status: DC

## 2018-05-18 MED ORDER — ONETOUCH VERIO W/DEVICE KIT: PACK | 0 refills | Status: DC

## 2018-05-18 MED ORDER — GLUCOSE BLOOD VI STRP: ORAL_STRIP | 1 refills | Status: DC

## 2018-05-18 MED ORDER — NONFORMULARY OR COMPOUNDED ITEM: 0 refills | Status: DC

## 2018-05-18 NOTE — Progress Notes (Signed)
Subjective:     [name] is a 63 y.o. female and is here for a comprehensive physical exam. The patient reports no problems.-- no new problems Pt seeing onc gyn regularly Husband concerned about weight loss but feel todays weight was wrong  Pt saw Dr Larose Kells for thrush and nystatin used ---- it is no better ----- culture was neg   Social History   Socioeconomic History  . Marital status: Married    Spouse name: Not on file  . Number of children: 2  . Years of education: Not on file  . Highest education level: Not on file  Occupational History  . Occupation: disability    Employer: [employer]  . Financial resource strain: Not on file  . Food insecurity:    Worry: Not on file    Inability: Not on file  . Transportation needs:    Medical: Not on file    Non-medical: Not on file  Tobacco Use  . Smoking status: Former Smoker    Packs/day: 1.00    Years: 1.00    Pack years: 1.00  . Smokeless tobacco: Former Network engineer and Sexual Activity  . Alcohol use: No  . Drug use: No    Comment: quit age 31 only smoked as a teen 1 year  . Sexual activity: Never  Lifestyle  . Physical activity:    Days per week: Not on file    Minutes per session: Not on file  . Stress: Not on file  Relationships  . Social connections:    Talks on phone: Not on file    Gets together: Not on file    Attends religious service: Not on file    Active member of club or organization: Not on file    Attends meetings of clubs or organizations: Not on file    Relationship status: Not on file  . Intimate partner violence:    Fear of current or ex partner: Not on file    Emotionally abused: Not on file    Physically abused: Not on file    Forced sexual activity: Not on file  Other Topics Concern  . Not on file  Social History Narrative   Married   Works at [employer] secretary   Daily caffeine use - 2 cups a day plus a couple of sodas a day   Pt doesn't exercise regularly    G2P2   H H of 5 soon to be 6 .    Health Maintenance  Topic Date Due  . HIV Screening  03/02/1969  . COLONOSCOPY  03/02/2004  . TETANUS/TDAP  05/12/2016  . URINE MICROALBUMIN  10/25/2016  . HEMOGLOBIN A1C  08/21/2017  . OPHTHALMOLOGY EXAM  10/23/2017  . FOOT EXAM  02/20/2018  . MAMMOGRAM  08/15/2019  . INFLUENZA VACCINE  Completed  . Hepatitis C Screening  Completed    The following portions of the patient's history were reviewed and updated as appropriate: She  has a past medical history of Allergy, Anemia, Arthritis, Breast cancer ([place]) (2014), Cataract, Chronic combined systolic and diastolic CHF (congestive heart failure) ([place]), Clear Cell Ovarian Cancer, Depression, Diabetes mellitus, DJD (degenerative joint disease) of lumbar spine, DVT (deep venous thrombosis) ([place]), Gallstones, GERD (gastroesophageal reflux disease), Hyperlipidemia, Hypertension, Hypothyroidism, Liver disease, chronic, with cirrhosis ([place]), OSA on CPAP, Paroxysmal atrial fibrillation ([place]), Physical deconditioning, PPD positive, treated, and Sleep apnea. She does not have any pertinent problems on file. She  has a  past surgical history that includes Tubal ligation; Cholecystectomy; Foot surgery; Eye surgery; Cataract extraction; Percutaneous liver biopsy; Breast biopsy; Mastectomy modified radical (06/08/2012); Portacath placement (06/08/2012); PEG tube placement; history of chemotherapy x 2 treatments, radiation tx (2014); pac removed (2015); laparotomy (N/A, 06/27/2014); Abdominal hysterectomy (N/A, 06/27/2014); Salpingoophorectomy (Bilateral, 06/27/2014); Breast excisional biopsy (Left, 2002); and Mastectomy (Right, 2014). Her family history includes Arthritis in her mother; Colon cancer in her maternal grandfather and paternal grandfather; Dementia in her father; Diabetes in her mother and paternal grandmother; Heart disease in her father and mother; Heart failure in her mother; Hypertension in her mother; Other in her  daughter, daughter, and mother; Stroke in her father. She  reports that she has quit smoking. She has a 1.00 pack-year smoking history. She has quit using smokeless tobacco. She reports that she does not drink alcohol or use drugs. She has a current medication list which includes the following prescription(s): acetaminophen, alprazolam, bupropion, escitalopram, furosemide, glimepiride, glucose blood, ketoconazole, letrozole, levothyroxine, lidocaine-prilocaine, metformin, metronidazole, nadolol, nystatin, olanzapine, ondansetron, onetouch delica lancets 16X, oxybutynin, potassium chloride, pravastatin, propranolol, onetouch verio, famotidine, and omeprazole. Current Outpatient Medications on File Prior to Visit  Medication Sig Dispense Refill  . acetaminophen (TYLENOL) 500 MG tablet Take 500 mg by mouth every 6 (six) hours as needed for moderate pain.    [name] ALPRAZolam (XANAX) 0.5 MG tablet TAKE 1 TABLET BY MOUTH TWO  TIMES DAILY AS NEEDED FOR  ANXIETY 60 tablet 0  . buPROPion (WELLBUTRIN XL) 150 MG 24 hr tablet TAKE 1 TABLET BY MOUTH  DAILY 90 tablet 1  . escitalopram (LEXAPRO) 20 MG tablet TAKE 1 TABLET BY MOUTH  EVERY MORNING 90 tablet 1  . furosemide (LASIX) 20 MG tablet TAKE 1 TABLET BY MOUTH  DAILY 90 tablet 0  . glimepiride (AMARYL) 2 MG tablet TAKE 1 TABLET BY MOUTH TWO  TIMES DAILY 180 tablet 0  . ketoconazole (NIZORAL) 2 % cream Apply 1 application topically daily. 15 g 3  . letrozole (FEMARA) 2.5 MG tablet TAKE 1 TABLET BY MOUTH  DAILY 90 tablet 4  . levothyroxine (SYNTHROID, LEVOTHROID) 150 MCG tablet TAKE 1 TABLET BY MOUTH  DAILY 90 tablet 0  . lidocaine-prilocaine (EMLA) cream APPLY TOPICALLY OVER PORT  AREA 1-2 HOURS BEFORE  CHEMOTHERAPY AS NEEDED 30 g 1  . metFORMIN (GLUCOPHAGE-XR) 500 MG 24 hr tablet TAKE 2 TABLETS BY MOUTH  EVERY EVENING 180 tablet 1  . metroNIDAZOLE (METROGEL) 1 % gel Apply topically daily. 45 g 0  . nadolol (CORGARD) 40 MG tablet Take 1 tablet (40 mg total) by  mouth daily. 90 tablet 3  . nystatin (MYCOSTATIN) 100000 UNIT/ML suspension Take 10 mLs (1,000,000 Units total) by mouth 4 (four) times daily. 240 mL 0  . OLANZapine (ZYPREXA) 2.5 MG tablet TAKE 1 TABLET BY MOUTH  NIGHTLY AT BEDTIME 90 tablet 0  . ondansetron (ZOFRAN) 8 MG tablet Take 1 tablet (8 mg total) by mouth every 8 (eight) hours as needed for nausea or vomiting. 270 tablet 0  . oxybutynin (DITROPAN) 5 MG tablet Take 1 tablet (5 mg total) by mouth 3 (three) times daily. 270 tablet 03  . potassium chloride (K-DUR,KLOR-CON) 10 MEQ tablet Take 1 tablet (10 mEq total) by mouth 2 (two) times daily. 180 tablet 0  . pravastatin (PRAVACHOL) 20 MG tablet TAKE 1 TABLET BY MOUTH AT  BEDTIME 90 tablet 1  . propranolol (INDERAL) 20 MG tablet TAKE 1 TABLET BY MOUTH TWO  TIMES DAILY 180 tablet 3  .  famotidine (PEPCID) 20 MG tablet Take 1 tablet (20 mg total) by mouth daily. (Patient not taking: Reported on 05/18/2018) 30 tablet 0  . omeprazole (PRILOSEC) 20 MG capsule Take 1 capsule (20 mg total) by mouth daily. (Patient not taking: Reported on 05/18/2018) 90 capsule 3   No current facility-administered medications on file prior to visit.    She is allergic to olmesartan medoxomil; adhesive [tape]; tetracycline hcl; and venlafaxine..  Review of Systems Review of Systems  Constitutional: Negative for activity change, appetite change and fatigue.  HENT: Negative for hearing loss, congestion, tinnitus and ear discharge.  dentist q8mEyes: Negative for visual disturbance (see optho q1y -- vision corrected to 20/20 with glasses).  Respiratory: Negative for cough, chest tightness and shortness of breath.   Cardiovascular: Negative for chest pain, palpitations and leg swelling.  Gastrointestinal: Negative for abdominal pain, diarrhea, constipation and abdominal distention.  Genitourinary: Negative for urgency, frequency, decreased urine volume and difficulty urinating.  Musculoskeletal: Negative for back pain,  arthralgias and gait problem.  Skin: Negative for color change, pallor and rash.  Neurological: Negative for dizziness, light-headedness, numbness and headaches.  Hematological: Negative for adenopathy. Does not bruise/bleed easily.  Psychiatric/Behavioral: Negative for suicidal ideas, confusion, sleep disturbance, self-injury, dysphoric mood, decreased concentration and agitation.       Objective:    BP (!) 100/54 (BP Location: Right Arm, Cuff Size: Normal)   Pulse 66   Temp 98.9 F (37.2 C) (Oral)   Resp 16   Ht '5\' 5"'  (1.651 m)   Wt 149 lb (67.6 kg)   SpO2 98%   BMI 24.79 kg/m  General appearance: alert, cooperative, appears stated age and no distress Head: Normocephalic, without obvious abnormality, atraumatic Eyes: conjunctivae/corneas clear. PERRL, EOM's intact. Fundi benign. Ears: normal TM's and external ear canals both ears Nose: Nares normal. Septum midline. Mucosa normal. No drainage or sinus tenderness. Throat: abnormal findings: moderate oropharyngeal erythema Neck: no adenopathy, no carotid bruit, no JVD, supple, symmetrical, trachea midline and thyroid not enlarged, symmetric, no tenderness/mass/nodules Back: symmetric, no curvature. ROM normal. No CVA tenderness. Lungs: clear to auscultation bilaterally Breasts: b/l mastectomy Heart: regular rate and rhythm, S1, S2 normal, no murmur, click, rub or gallop Abdomen: soft, non-tender; bowel sounds normal; no masses,  no organomegaly Pelvic: not indicated; status post hysterectomy, negative ROS Extremities: extremities normal, atraumatic, no cyanosis or edema Pulses: 2+ and symmetric Skin: Skin color, texture, turgor normal. No rashes or lesions Lymph nodes: Cervical, supraclavicular, and axillary nodes normal. Neurologic: weakness -- pt in wheel chair---  Uses walker at home    Assessment:    Healthy female exam.      Plan:    ghm utd Check labs  See After Visit Summary for Counseling Recommendations     1. Uncontrolled type 2 diabetes mellitus with hyperglycemia (HCC) Check labs  con't meds  - glucose blood (ONETOUCH VERIO) test strip; USE TO CHECK BLOOD SUGAR TWICE DAILY  Dispense: 200 each; Refill: 1 - ONETOUCH DELICA LANCETS 344LMISC; USE TO CHECK BLOOD SUGAR TWICE DAILY.  Dx code: E11.9  Dispense: 200 each; Refill: 1 - Blood Glucose Monitoring Suppl (ONETOUCH VERIO) w/Device KIT; Use as directed twice a day.  Dx code: E11.9  Dispense: 1 kit; Refill: 0 - POCT Urinalysis Dipstick (Automated) - Microalbumin / creatinine urine ratio  2. Hyperlipidemia associated with type 2 diabetes mellitus ([place] Encouraged heart healthy diet, increase exercise, avoid trans fats, consider a krill oil cap daily - NONFORMULARY OR COMPOUNDED ITEM;  Lipid panel   Dx hyperlipidemia  nonfasting is ok  Dispense: 1 each; Refill: 0  3. Thrush Will refer to ENT if no better  - clotrimazole (MYCELEX) 10 MG troche; Take 1 tablet (10 mg total) by mouth 3 (three) times daily.  Dispense: 30 tablet; Refill: 0

## 2018-05-18 NOTE — Patient Instructions (Signed)
Preventive Care 40-64 Years, Female Preventive care refers to lifestyle choices and visits with your health care provider that can promote health and wellness. What does preventive care include?   A yearly physical exam. This is also called an annual well check.  Dental exams once or twice a year.  Routine eye exams. Ask your health care provider how often you should have your eyes checked.  Personal lifestyle choices, including: ? Daily care of your teeth and gums. ? Regular physical activity. ? Eating a healthy diet. ? Avoiding tobacco and drug use. ? Limiting alcohol use. ? Practicing safe sex. ? Taking low-dose aspirin daily starting at age 65. ? Taking vitamin and mineral supplements as recommended by your health care provider. What happens during an annual well check? The services and screenings done by your health care provider during your annual well check will depend on your age, overall health, lifestyle risk factors, and family history of disease. Counseling Your health care provider may ask you questions about your:  Alcohol use.  Tobacco use.  Drug use.  Emotional well-being.  Home and relationship well-being.  Sexual activity.  Eating habits.  Work and work environment.  Method of birth control.  Menstrual cycle.  Pregnancy history. Screening You may have the following tests or measurements:  Height, weight, and BMI.  Blood pressure.  Lipid and cholesterol levels. These may be checked every 5 years, or more frequently if you are over 50 years old.  Skin check.  Lung cancer screening. You may have this screening every year starting at age 65 if you have a 30-pack-year history of smoking and currently smoke or have quit within the past 15 years.  Colorectal cancer screening. All adults should have this screening starting at age 65 and continuing until age 75. Your health care provider may recommend screening at age 45. You will have tests every  1-10 years, depending on your results and the type of screening test. People at increased risk should start screening at an earlier age. Screening tests may include: ? Guaiac-based fecal occult blood testing. ? Fecal immunochemical test (FIT). ? Stool DNA test. ? Virtual colonoscopy. ? Sigmoidoscopy. During this test, a flexible tube with a tiny camera (sigmoidoscope) is used to examine your rectum and lower colon. The sigmoidoscope is inserted through your anus into your rectum and lower colon. ? Colonoscopy. During this test, a long, thin, flexible tube with a tiny camera (colonoscope) is used to examine your entire colon and rectum.  Hepatitis C blood test.  Hepatitis B blood test.  Sexually transmitted disease (STD) testing.  Diabetes screening. This is done by checking your blood sugar (glucose) after you have not eaten for a while (fasting). You may have this done every 1-3 years.  Mammogram. This may be done every 1-2 years. Talk to your health care provider about when you should start having regular mammograms. This may depend on whether you have a family history of breast cancer.  BRCA-related cancer screening. This may be done if you have a family history of breast, ovarian, tubal, or peritoneal cancers.  Pelvic exam and Pap test. This may be done every 3 years starting at age 21. Starting at age 30, this may be done every 5 years if you have a Pap test in combination with an HPV test.  Bone density scan. This is done to screen for osteoporosis. You may have this scan if you are at high risk for osteoporosis. Discuss your test results, treatment options,   and if necessary, the need for more tests with your health care provider. Vaccines Your health care provider may recommend certain vaccines, such as:  Influenza vaccine. This is recommended every year.  Tetanus, diphtheria, and acellular pertussis (Tdap, Td) vaccine. You may need a Td booster every 10 years.  Varicella  vaccine. You may need this if you have not been vaccinated.  Zoster vaccine. You may need this after age 38.  Measles, mumps, and rubella (MMR) vaccine. You may need at least one dose of MMR if you were born in 1957 or later. You may also need a second dose.  Pneumococcal 13-valent conjugate (PCV13) vaccine. You may need this if you have certain conditions and were not previously vaccinated.  Pneumococcal polysaccharide (PPSV23) vaccine. You may need one or two doses if you smoke cigarettes or if you have certain conditions.  Meningococcal vaccine. You may need this if you have certain conditions.  Hepatitis A vaccine. You may need this if you have certain conditions or if you travel or work in places where you may be exposed to hepatitis A.  Hepatitis B vaccine. You may need this if you have certain conditions or if you travel or work in places where you may be exposed to hepatitis B.  Haemophilus influenzae type b (Hib) vaccine. You may need this if you have certain conditions. Talk to your health care provider about which screenings and vaccines you need and how often you need them. This information is not intended to replace advice given to you by your health care provider. Make sure you discuss any questions you have with your health care provider. Document Released: 05/25/2015 Document Revised: 06/18/2017 Document Reviewed: 02/27/2015 Elsevier Interactive Patient Education  2019 [employer].

## 2018-05-18 NOTE — Patient Instructions (Signed)
Please schedule your next medicare wellness visit with me in 1 yr.  Continue to eat heart healthy diet (full of fruits, vegetables, whole grains, lean protein, water--limit salt, fat, and sugar intake) and increase physical activity as tolerated.  Continue doing brain stimulating activities (puzzles, reading, adult coloring books, staying active) to keep memory sharp.    [name] , Thank you for taking time to come for your Medicare Wellness Visit. I appreciate your ongoing commitment to your health goals. Please review the following plan we discussed and let me know if I can assist you in the future.   These are the goals we discussed: Goals    . MAINTAIN HEALTHY WT    . Weight (lb) < 200 lb (90.7 kg)       This is a list of the screening recommended for you and due dates:  Health Maintenance  Topic Date Due  . HIV Screening  03/02/1969  . Colon Cancer Screening  03/02/2004  . Tetanus Vaccine  05/12/2016  . Urine Protein Check  10/25/2016  . Hemoglobin A1C  08/21/2017  . Eye exam for diabetics  10/23/2017  . Complete foot exam   02/20/2018  . Mammogram  08/15/2019  . Flu Shot  Completed  .  Hepatitis C: One time screening is recommended by Center for Disease Control  (CDC) for  adults born from [birth year] through 1967.   Completed    Health Maintenance After Age 43 After age 44, you are at a higher risk for certain long-term diseases and infections as well as injuries from falls. Falls are a major cause of broken bones and head injuries in people who are older than age 17. Getting regular preventive care can help to keep you healthy and well. Preventive care includes getting regular testing and making lifestyle changes as recommended by your health care provider. Talk with your health care provider about:  Which screenings and tests you should have. A screening is a test that checks for a disease when you have no symptoms.  A diet and exercise plan that is right for you. What  should I know about screenings and tests to prevent falls? Screening and testing are the best ways to find a health problem early. Early diagnosis and treatment give you the best chance of managing medical conditions that are common after age 58. Certain conditions and lifestyle choices may make you more likely to have a fall. Your health care provider may recommend:  Regular vision checks. Poor vision and conditions such as cataracts can make you more likely to have a fall. If you wear glasses, make sure to get your prescription updated if your vision changes.  Medicine review. Work with your health care provider to regularly review all of the medicines you are taking, including over-the-counter medicines. Ask your health care provider about any side effects that may make you more likely to have a fall. Tell your health care provider if any medicines that you take make you feel dizzy or sleepy.  Osteoporosis screening. Osteoporosis is a condition that causes the bones to get weaker. This can make the bones weak and cause them to break more easily.  Blood pressure screening. Blood pressure changes and medicines to control blood pressure can make you feel dizzy.  Strength and balance checks. Your health care provider may recommend certain tests to check your strength and balance while standing, walking, or changing positions.  Foot health exam. Foot pain and numbness, as well as not wearing  proper footwear, can make you more likely to have a fall.  Depression screening. You may be more likely to have a fall if you have a fear of falling, feel emotionally low, or feel unable to do activities that you used to do.  Alcohol use screening. Using too much alcohol can affect your balance and may make you more likely to have a fall. What actions can I take to lower my risk of falls? General instructions  Talk with your health care provider about your risks for falling. Tell your health care provider  if: ? You fall. Be sure to tell your health care provider about all falls, even ones that seem minor. ? You feel dizzy, sleepy, or off-balance.  Take over-the-counter and prescription medicines only as told by your health care provider. These include any supplements.  Eat a healthy diet and maintain a healthy weight. A healthy diet includes low-fat dairy products, low-fat (lean) meats, and fiber from whole grains, beans, and lots of fruits and vegetables. Home safety  Remove any tripping hazards, such as rugs, cords, and clutter.  Install safety equipment such as grab bars in bathrooms and safety rails on stairs.  Keep rooms and walkways well-lit. Activity   Follow a regular exercise program to stay fit. This will help you maintain your balance. Ask your health care provider what types of exercise are appropriate for you.  If you need a cane or walker, use it as recommended by your health care provider.  Wear supportive shoes that have nonskid soles. Lifestyle  Do not drink alcohol if your health care provider tells you not to drink.  If you drink alcohol, limit how much you have: ? 0-1 drink a day for women. ? 0-2 drinks a day for men.  Be aware of how much alcohol is in your drink. In the [place] one drink equals one typical bottle of beer (12 oz), one-half glass of wine (5 oz), or one shot of hard liquor (1 oz).  Do not use any products that contain nicotine or tobacco, such as cigarettes and e-cigarettes. If you need help quitting, ask your health care provider. Summary  Having a healthy lifestyle and getting preventive care can help to protect your health and wellness after age 50.  Screening and testing are the best way to find a health problem early and help you avoid having a fall. Early diagnosis and treatment give you the best chance for managing medical conditions that are more common for people who are older than age 49.  Falls are a major cause of broken bones and  head injuries in people who are older than age 74. Take precautions to prevent a fall at home.  Work with your health care provider to learn what changes you can make to improve your health and wellness and to prevent falls. This information is not intended to replace advice given to you by your health care provider. Make sure you discuss any questions you have with your health care provider. Document Released: 03/11/2017 Document Revised: 03/11/2017 Document Reviewed: 03/11/2017 Elsevier Interactive Patient Education  2019 [employer].

## 2018-05-18 NOTE — Progress Notes (Signed)
Reviewed  Yvonne R Lowne Chase, DO  

## 2018-05-19 ENCOUNTER — Other Ambulatory Visit: Payer: Self-pay | Admitting: Oncology

## 2018-05-19 ENCOUNTER — Other Ambulatory Visit: Payer: Self-pay | Admitting: Family Medicine

## 2018-05-19 DIAGNOSIS — IMO0002 Reserved for concepts with insufficient information to code with codable children: Secondary | ICD-10-CM

## 2018-05-19 DIAGNOSIS — E785 Hyperlipidemia, unspecified: Secondary | ICD-10-CM

## 2018-05-19 DIAGNOSIS — E1165 Type 2 diabetes mellitus with hyperglycemia: Secondary | ICD-10-CM

## 2018-05-19 DIAGNOSIS — I10 Essential (primary) hypertension: Secondary | ICD-10-CM

## 2018-05-19 DIAGNOSIS — E1151 Type 2 diabetes mellitus with diabetic peripheral angiopathy without gangrene: Secondary | ICD-10-CM

## 2018-05-19 DIAGNOSIS — N390 Urinary tract infection, site not specified: Secondary | ICD-10-CM

## 2018-05-19 LAB — LIPID PANEL W/O CHOL/HDL RATIO
CHOLESTEROL TOTAL: 119 mg/dL (ref 100–199)
HDL: 35 mg/dL — ABNORMAL LOW (ref >39)
LDL Calculated: 67 mg/dL (ref 0–99)
Triglycerides: 85 mg/dL (ref 0–149)
VLDL Cholesterol Cal: 17 mg/dL (ref 5–40)

## 2018-05-19 LAB — HGB A1C W/O EAG: Hgb A1c MFr Bld: <4.2 % — ABNORMAL LOW (ref 4.8–5.6)

## 2018-05-20 ENCOUNTER — Telehealth: Payer: Self-pay | Admitting: *Deleted

## 2018-05-20 ENCOUNTER — Other Ambulatory Visit: Payer: Self-pay

## 2018-05-20 MED ORDER — ALPRAZOLAM 0.5 MG PO TABS: ORAL_TABLET | ORAL | 0 refills | Status: DC

## 2018-05-20 NOTE — Telephone Encounter (Signed)
Received fax Approval for patient's Power Mobility Device, it is in the process of being delivered; forwarded to provider/SLS 01/09

## 2018-05-21 ENCOUNTER — Telehealth: Payer: Self-pay

## 2018-05-21 MED ORDER — TETANUS-DIPHTHERIA TOXOIDS TD 2-2 LF/0.5ML IM SUSP: 0.5000 mL | Freq: Once | INTRAMUSCULAR | 0 refills | Status: AC

## 2018-05-21 NOTE — Telephone Encounter (Signed)
Copied from [place] [phone]. Topic: General - Other >> May 19, 2018  1:53 PM Windy Kalata wrote: Reason for CRM: Patient would like to know if a prescription can be called in for her to get a tetnus shot, her husband states that the pharmacy told him that it is $60.00 if no prescription.  [place], [place] [phone] (Phone) [phone] (Fax)  Best call back is [phone] >> May 21, 2018  2:34 PM Lennox Solders wrote: Pt husband is calling back and he is aware can take up to 3 business day

## 2018-05-23 NOTE — Progress Notes (Signed)
[name] [name]  [phone] [phone]    ID: [name] [name]   DOB: 1955-07-17  MR#: [id number]  BEM#:[id number]  Patient Care Team: [name] [name], [name] Apa, DO as PCP - General (Family Medicine) [name] [name], [name] Dad, MD as Consulting Physician (Oncology) [name] Seltzer, MD as Consulting Physician (General Surgery) [name] Amber, MD as Consulting Physician (Obstetrics and Gynecology) [name] Arabian, MD as Consulting Physician (Orthopedic Surgery) [name] [name], [name] Raspberry, MD as Consulting Physician (Gastroenterology) [name] [name], [name] Brooking, MD as Consulting Physician (Internal Medicine) [name] Balding, MD as Consulting Physician (Orthopedic Surgery)  OTHER MD: [name] Nones, MD; Rhonda Simons, MD  CHIEF COMPLAINT:  Estrogen receptor positive Breast Cancer; Ovarian cancer; DVT  CURRENT TREATMENT: Letrozole  INTERVAL HISTORY: [name] [name] returns today for follow-up and treatment of he estrogen receptor positive breast cancer. She is accompanied by her husband.  The patient continues on letrozole.  She tolerates this well, with no hot flashes or significant vaginal dryness issues.  [name] [name]'s last bone density screening on 08/14/2017, showed a T-score of -2.7, which is considered osteoporotic.    Since her last visit here, she underwent a chest, abdomen, and pelvis CT with contrast on 03/18/2018 showing interval progression of para-aortic retroperitoneal lymphadenopathy. This lymphadenopathy was noted to be hypermetabolic on PET-CT of 11/20/1973. Cirrhotic liver morphology. 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 Stable right lung nodules and stable borderline to mild mediastinal lymphadenopathy (non hypermetabolic on previous PET-CT). Small volume ascites. Tiny nonobstructing right renal stone. Aortic Atherosclerois (ICD10-170.0)  She also underwent a skull base to thigh restaging PET scan on 04/02/2018 showing interval increase  in size and metabolic activity of retroperitoneal lymph nodes LEFT of aorta at the level of the kidneys consistent with metastatic nodal disease progression. Interval increase in metabolic activity of small LEFT axillary lymph nodes and mild increase in size of small prominent mediastinal lymph nodes with at least one with mild activity. While the hypermetabolic activity is low and these lymph nodes are small, concern for progression of metastatic adenopathy outside the pelvis.  Her cancer marker is slightly up but there is no consistent pattern: Results for AHTZIRY, SAATHOFF (MRN [id number]) as of 05/24/2018 08:20  Ref. Range 12/21/2017 09:03 01/12/2018 09:11 02/23/2018 08:38 03/15/2018 08:58 05/17/2018 09:32  Cancer Antigen (CA) 125 Latest Ref Range: 0.0 - 38.1 U/mL  160.0 (H) 181.0 (H) 137.0 (H) 193.0 (H)      REVIEW OF SYSTEMS: For the holidays, [name] [name] visited her two grandchildren, aged 16 and 53; she went to their house and [name] [name]'s sister-in-law and daughter did all of the cooking. On a normal day, [name] [name] and her husband eat breakfast, then they clean a room of the house, then they watch the [employer] channell. They are also somewhat active with their pomeranean. [name] had a urinary tract infection, for which she had medicine prescribed by PCP, but she developed thrush, which she was also prescribed medication for. [name] just got an IT trainer wheelchair and is learning how to use it. The patient denies unusual headaches, visual changes, nausea, vomiting, or dizziness. There has been no unusual cough, phlegm production, or pleurisy. This been no change in bowel habits. The patient denies unexplained fatigue or unexplained weight loss, bleeding, rash, or fever. A detailed review of systems was otherwise noncontributory.    BREAST CANCER HISTORY: From the original consult note:  [name] [name] noted a mass in her right breast mid December 2013, and as it did not spontaneously resolve over a couple of weeks  she brought  it to her primary physician's attention. She was set up for diagnostic mammography and right breast ultrasonography at the breast Center 05/10/2012. (Note that the patient's most recent prior mammography had been in October 2008). The current study showed a spiculated mass in the superior subareolar portion of the right breast measuring approximately 5 cm and associated with pleomorphic calcifications. This was firm and palpable. There was right nipple retraction and skin thickening. Ultrasound confirmed an irregularly marginated hypoechoic mass measuring 3.5 cm by ultrasound, and an abnormal appearing lower right axillary lymph node measuring 2.6 cm.  Biopsies of both the breast mass and the abnormal appearing lymph node were performed 05/21/2012. Both showed an invasive ductal carcinoma, grade 2, with similar prognostic panels (the breast mass was 100% estrogen and 73% progesterone receptor positive, with an MIB-1 of 5%; the lymph node was 100% estrogen 100% progesterone receptor positive, with an MIB-1 of 20%). Both masses were HER-2 negative.  Breast MRI obtained at [facility] imaging 05/29/2012 confirmed a dominant mass in the retroareolar right breast measuring 4.4 cm maximally. There was a satellite nodule inferior and lateral to this mass, measuring 1.7 cm. There were no other masses in either breast. Aside from the previously biopsied lymph node there were other mildly enhancing level I right axillary lymph nodes which did not appear pathologic. There was no other lymphadenopathy noted.   The patient's subsequent history is as detailed below.  OVARIAN CANCER HISTORY: From the 06/16/2014 summary note:  "[name] [name] returns today for review of her restaging studies accompanied by her husband [name]. To summarize her recent history: She was admitted to [facility] with a diagnosis of possible pneumonia in late December 2015. Chest x-ray showed some increased density in the right upper lobe.  CT scan was suggested and was obtained by Dr. Inda Castle. This showed 2 small nodules in the right middle lobe, measuring 8 and 4 mm respectively. Dr. Inda Castle then contacted [name] for further evaluation and we set [place] up for a PET scan and which was performed late January. This showed the 2 nodules in the lung not to be hypermetabolic. This of course does not prove that they are not malignant. More importantly, there was a 13.7 cm cystic/solid mass in the upper pelvis associated with adenopathy,. All of this was hypermetabolic."  The patient was evaluated by gynecologic oncology and on 06/27/2014 she underwent surgery under Dr. Denman George. This consisted of an exploratory laparotomy with hysterectomy and abdominal salpingo-oophorectomy, as well as tumor debulking and omentectomy. 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 The tumor measured 11.5 cm, focally involve the ovarian capsule on the left; the uterus and right ovary and fallopian tube were unremarkable. One para-aortic lymph node was positive out of a total of 6 lymph nodes sampled (2 left common iliac, 2 left para-aortic, and to within the omental resection).  Her subsequent history is as detailed below   PAST MEDICAL HISTORY: Past Medical History:  Diagnosis Date  . Allergy   . Anemia    low iron hx  . Arthritis   . Breast cancer ([place]) 2014   a. 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 Cataract    removed ou  . Chronic combined systolic and diastolic CHF (congestive heart failure) ([place])    a. 08/2012 Echo: EF 55-60%,  no rwma, mod MR;  b. 07/2014 Echo: EF 45-50%, distal antsept HK, mod TR/MR, mildly bil-atrial enlargement.  . Clear Cell Ovarian Cancer    a. 06/2014 s/p TAH/BSO debulking/lymph node dissection;  b. Now on chemo.  . Depression   .  Diabetes mellitus   . DJD (degenerative joint disease) of lumbar spine   . DVT (deep venous thrombosis) (H[facility])    hx of on HRT left leg ~2006  . Gallstones   . GERD (gastroesophageal reflux disease)   . Hyperlipidemia   . Hypertension   . Hypothyroidism   . Liver disease, chronic, with cirrhosis ([place])    ? autoimmune  . OSA on CPAP    cpap 4.5 setting  . Paroxysmal atrial fibrillation (H[facility])    a. CHA2DS2VASc = 4-->coumadin.  [name] Physical deconditioning   . PPD positive, treated    rx inh   . Sleep apnea     PAST SURGICAL HISTORY: Past Surgical History:  Procedure Laterality Date  . ABDOMINAL HYSTERECTOMY N/A 06/27/2014   Procedure: HYSTERECTOMY ABDOMINAL TOTAL;  Surgeon: [name] Amber, MD;  Location: [facility];  Service: Gynecology;  Laterality: N/A;  . BREAST BIOPSY     left breast  . BREAST EXCISIONAL BIOPSY Left 2002  . CATARACT EXTRACTION    . CHOLECYSTECTOMY    . EYE SURGERY    . FOOT SURGERY    . history of chemotherapy x 2 treatments, radiation tx  2014  . LAPAROTOMY N/A 06/27/2014   Procedure: EXPLORATORY LAPAROTOMY;  Surgeon: [name] Amber, MD;  Location: [facility];  Service: Gynecology;  Laterality: N/A;  . MASTECTOMY Right 2014  . MASTECTOMY MODIFIED RADICAL  06/08/2012   Procedure: MASTECTOMY MODIFIED RADICAL;  Surgeon: [name] Jolly, MD;  Location: [place];  Service: General;  Laterality: Right;  . pac removed  2015  . PEG TUBE PLACEMENT     peg removed 2015  . PERCUTANEOUS LIVER BIOPSY    . PORTACATH PLACEMENT  06/08/2012   Procedure: INSERTION PORT-A-CATH;  Surgeon: [name] Jolly, MD;  Location: [place];  Service: General;  Laterality: Left;  . SALPINGOOPHORECTOMY Bilateral 06/27/2014   Procedure: BILATERAL SALPINGO OOPHORECTOMY/TUMOR DEBULKING/LYMPHNODE DISSECTION, OMENTECTOMY;  Surgeon: [name] Amber, MD;  Location: [facility];  Service: Gynecology;  Laterality: Bilateral;  . TUBAL LIGATION      FAMILY HISTORY Family History  Problem Relation Age of Onset  .  Diabetes Mother   . Hypertension Mother   . Arthritis Mother   . Heart disease Mother   . Heart failure Mother   . Other Mother        benign breast mass  . Stroke Father   . Heart disease Father   . Dementia Father   . Diabetes Paternal Grandmother   . Colon cancer Paternal Grandfather        dx. 70s-80s  . Colon cancer Maternal Grandfather   . Other Daughter        potentially has had negative BRCA testing  . Other Daughter        hysterectomy; potentially has had negative genetic testing  . Rectal cancer Neg Hx   . Esophageal cancer Neg Hx   . Liver cancer Neg Hx    The patient's father died in his 42s with a history of dementia. He had had prior strokes. The patient's mother died in her 33s, with a history of congestive heart failure. Aanshi had no brothers, one sister. There is no history of breast or ovarian cancer in the  family.   GYNECOLOGIC HISTORY: Menarche age 34, first live birth age 27, she is GX P2, menopause approximately 15 years ago, on hormone replacement until 2010.   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 Her husband [name] works for [employer]. Daughter [name] [name] is a Physiological scientist and lives in [place]. Daughter [name] [name] and her family (husband and 2 children aged 82 and 3 years) currently live with the patient. [name] [name] is a member of a [employer].     ADVANCED DIRECTIVES: Not in place   HEALTH MAINTENANCE: (Updated October 2014) Social History   Tobacco Use  . Smoking status: Former Smoker    Packs/day: 1.00    Years: 1.00    Pack years: 1.00  . Smokeless tobacco: Former Network engineer Use Topics  . Alcohol use: No  . Drug use: No    Comment: quit age 66 only smoked as a teen 1 year     Colonoscopy: Never  PAP: Does not recall  Bone density:  08/14/2017 showed a T score of -2.7 osteoporosis  Lipid panel:     Allergies  Allergen Reactions  . Olmesartan Medoxomil Cough  . Adhesive [Tape] Rash  . Tetracycline Hcl Other (See Comments)  . Venlafaxine Other (See Comments)    severe dry mouth    Current Outpatient Medications  Medication Sig Dispense Refill  . acetaminophen (TYLENOL) 500 MG tablet Take 500 mg by mouth every 6 (six) hours as needed for moderate pain.    [name] ALPRAZolam (XANAX) 0.5 MG tablet TAKE 1 TABLET BY MOUTH TWO  TIMES DAILY AS NEEDED FOR  ANXIETY 60 tablet 0  . Blood Glucose Monitoring Suppl (ONETOUCH VERIO) w/Device KIT Use as directed twice a day.  Dx code: E11.9 1 kit 0  . buPROPion (WELLBUTRIN XL) 150 MG 24 hr tablet TAKE 1 TABLET BY MOUTH  DAILY 90 tablet 1  . clotrimazole (MYCELEX) 10 MG troche Take 1 tablet (10 mg total) by mouth 3 (three) times daily. 30 tablet 0  . escitalopram (LEXAPRO) 20 MG tablet TAKE 1 TABLET BY MOUTH  EVERY MORNING 90 tablet 1  . famotidine (PEPCID) 20 MG tablet Take 1 tablet (20 mg total) by mouth daily. (Patient not taking: Reported on 05/18/2018) 30 tablet 0  . furosemide (LASIX) 20 MG tablet TAKE 1 TABLET BY MOUTH  DAILY 90 tablet 0  . glimepiride (AMARYL) 2 MG tablet TAKE 1 TABLET BY MOUTH TWO  TIMES DAILY 180 tablet 0  . glucose blood (ONETOUCH VERIO) test strip USE TO CHECK BLOOD SUGAR TWICE DAILY 200 each 1  . ketoconazole (NIZORAL) 2 % cream Apply 1 application topically daily. 15 g 3  . letrozole (FEMARA) 2.5 MG tablet TAKE 1 TABLET BY MOUTH  DAILY 90 tablet 4  . levothyroxine (SYNTHROID, LEVOTHROID) 150 MCG tablet TAKE 1 TABLET BY MOUTH  DAILY 90 tablet 1  . lidocaine-prilocaine (EMLA) cream APPLY TOPICALLY OVER PORT  AREA 1-2 HOURS BEFORE  CHEMOTHERAPY AS NEEDED 30 g 1  . metFORMIN (GLUCOPHAGE-XR) 500 MG 24 hr tablet TAKE 2 TABLETS BY MOUTH  EVERY EVENING 180 tablet 1  . metroNIDAZOLE (METROGEL) 1 % gel Apply topically daily. 45 g 0  . nadolol (CORGARD) 40 MG tablet Take 1 tablet (40 mg total) by mouth daily. 90 tablet 3  .  NONFORMULARY OR COMPOUNDED ITEM Lipid panel   Dx hyperlipidemia  nonfasting is ok  1 each 0  . nystatin (MYCOSTATIN) 100000 UNIT/ML suspension Take 10 mLs (1,000,000 Units total) by mouth 4 (four) times daily. 240 mL 0  . OLANZapine (ZYPREXA) 2.5 MG tablet TAKE 1 TABLET BY MOUTH  NIGHTLY AT BEDTIME 90 tablet 1  . omeprazole (PRILOSEC) 20 MG capsule Take 1 capsule (20 mg total) by mouth daily. (Patient not taking: Reported on 05/18/2018) 90 capsule 3  . ondansetron (ZOFRAN) 8 MG tablet Take 1 tablet (8 mg total) by mouth every 8 (eight) hours as needed for nausea or vomiting. 270 tablet 0  . ONETOUCH DELICA LANCETS 63A MISC USE TO CHECK BLOOD SUGAR TWICE DAILY.  Dx code: E11.9 200 each 1  . oxybutynin (DITROPAN) 5 MG tablet Take 1 tablet (5 mg total) by mouth 3 (three) times daily. 270 tablet 03  . potassium chloride (K-DUR,KLOR-CON) 10 MEQ tablet TAKE 1 TABLET BY MOUTH TWO  TIMES DAILY 180 tablet 1  . pravastatin (PRAVACHOL) 20 MG tablet TAKE 1 TABLET BY MOUTH AT  BEDTIME 90 tablet 1  . propranolol (INDERAL) 20 MG tablet TAKE 1 TABLET BY MOUTH TWO  TIMES DAILY 180 tablet 3   No current facility-administered medications for this visit.     OBJECTIVE: Middle-aged white woman in no acute distress, examined in a wheelchair  Vitals:   05/24/18 0830  BP: 113/85  Pulse: 95  Resp: 18  Temp: 98 F (36.7 C)  SpO2: 99%     Body mass index is 25.09 kg/m.    ECOG FS: 1 Filed Weights   05/24/18 0830  Weight: 150 lb 12.8 oz (68.4 kg)   Sclerae unicteric, EOMs intact Oropharynx clear and moist No cervical or supraclavicular adenopathy Lungs no rales or rhonchi Heart regular rate and rhythm Abd soft, nontender, positive bowel sounds MSK no upper extremity lymphedema Neuro: nonfocal, well oriented, appropriate affect Breasts: Status post right mastectomy.  There is no evidence of chest wall recurrence.  I do not palpate a mass in the left breast or left axilla.   Lab Results  Component Value  Date   WBC 3.5 (L) 05/17/2018   NEUTROABS 2.1 05/17/2018   HGB 11.8 (L) 05/17/2018   HCT 35.7 (L) 05/17/2018   MCV 101.7 (H) 05/17/2018   PLT 111 (L) 05/17/2018     Chemistry      Component Value Date/Time   NA 141 05/17/2018 0932   NA 140 04/28/2017 0809   K 3.4 (L) 05/17/2018 0932   K 3.4 (L) 04/28/2017 0809   CL 108 05/17/2018 0932   CL 104 10/29/2012 1058   CO2 23 05/17/2018 0932   CO2 22 04/28/2017 0809   BUN 5 (L) 05/17/2018 0932   BUN 6.5 (L) 04/28/2017 0809   CREATININE 0.59 05/17/2018 0932   CREATININE 0.6 04/28/2017 0809      Component Value Date/Time   CALCIUM 8.4 (L) 05/17/2018 0932   CALCIUM 8.4 04/28/2017 0809   ALKPHOS 129 (H) 05/17/2018 0932   ALKPHOS 143 04/28/2017 0809   AST 40 05/17/2018 0932   AST 53 (H) 04/28/2017 0809   ALT 16 05/17/2018 0932   ALT 21 04/28/2017 0809   BILITOT 2.0 (H) 05/17/2018 0932   BILITOT 2.96 (H) 04/28/2017 0809      STUDIES: No results found.   ASSESSMENT: 63 y.o.  BRCA negative [name]   BREAST CANCER (1)  status post right mastectomy  06/08/2012 for a pT3, pN1a, stage IIIA invasive ductal carcinoma of overlapping sites, grade 2,  estrogen and  progesterone receptor positive, HER-2/neu negative, with an MIB-1 of 20%.    (2)  treated in the adjuvant setting with docetaxel/ cyclophosphamide given every 3 weeks. There were multiple and severe complications, and the  patient tolerated only two cycles, last dose 07/29/2012  (3) letrozole started 08/16/2012  (a) osteopenia, with a T score of -1.14 April 2013  (b) bone density on 08/14/2017 showed a T score of -2.7 osteoporosis  (4) postmastectomy radiation completed 12/24/2012  (5)  comorbidities include diabetes, hypertension, chronic liver disease with cirrhosis and fatty liver, and nutritional disturbance with temporarily dependence on PEG feeds (discontinued July 2014).  (6) restaging studies January 2016 show  (a) 8 mm and 4 mm Right middle lobe lung  nodules, not metabolically active on PET   (b) hypermetabolic 46.8 cm mixed solid/cystic lesion in upper pelvis, with associated adenopathy (see #7)  OVARIAN CANCER (7) status post exploratory laparotomy 06/27/2014 with total abdominal hysterectomy, bilateral salpingo-oophorectomy, para-aortic lymphadenectomy, omentectomy and radical tumor debulking for a left-sided clear cell ovarian cancer, pT1c pN1, stage IIIC .  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 despite being on Coumadin for PAF (subtherapeutic INR) with mobile Right common femoral clot  (a) status post thrombolysis 09/30/2014  (b) on Lovenox daily as of 10/02/2014, stopped April 2017  (c) IVC filter placed 10/26/2014 as mobile clot still noted Right groin  (d) IVC filter removed 01/30/2015 with documented resolution of the movable thrombus 01/16/2015  (e) normal quantitative D-dimer 08/06/2015 and 09/24/2015  (10) started abraxane 10/13/2014, repeated every 14 days for 6 cycles (12 doses), completed 03/26/2015  (11) genetics testing December 04, 2014 through [facility] Breast[facility]ian [facility] deleterious mutations in ATM, BARD1, BRCA1, BRCA2, BRIP1, CDH1, CHEK2, FANCC, MLH1, MSH2, MSH6, NBN, PALB2, PMS2, PTEN, RAD51C, RAD51D, STK11, TP53, and XRCC2.    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  (a) spleen measured 14.3 cm on CT scan of the abdomen 10/17/2016  ONGOING  MONITORING: (14) pet SCAN 04/02/2018 shows retroperitoneal adenopathy, hypermetabolic and small left axillary and mediastinal lymph nodes  (a) Ca 125 is informative  PLAN: Sargun is 6 years out from initial diagnosis of her breast cancer.  She continues on letrozole, with good tolerance.  She does have osteoporosis, but at present we are holding off on treatment for her bones because of dental concerns  She is just about 3 years out from surgery for her ovarian cancer.  Clinically there are no symptoms of disease activity.  She does have some adenopathy which is moderately hot on PET scanning.  We have been following this without biopsy as it does not really fit the pattern of either her breast or ovarian cancers.  Conceivably it could be a low-grade lymphoma.  She will have repeat scans in late March.  Of course we are continuing to check the tumor markers as well  Because there was a question regarding possible left axillary lymph nodes (not enlarged but slightly hot" in the last PET scan, I am obtaining a left mammogram  and ultrasound next week and if there is anything to biopsy we will proceed.  Otherwise she will return to see me in April after her March scans.  Her primary care physician requested a lipid panel today which we are glad to obtain for her  Lili Harts, [name] Dad, MD  05/24/18 8:53 AM Medical Oncology and Hematology [facility] [place], [place] [address] Tel. [phone]    Fax. [phone]  I, Jacqualyn Posey am acting as a Education administrator for Chauncey Cruel, MD.   I, Lurline Del MD, have reviewed the above documentation for accuracy and completeness, and I agree with the above.

## 2018-05-24 ENCOUNTER — Inpatient Hospital Stay: Payer: Medicare Other

## 2018-05-24 ENCOUNTER — Telehealth: Payer: Self-pay | Admitting: Oncology

## 2018-05-24 ENCOUNTER — Inpatient Hospital Stay (HOSPITAL_BASED_OUTPATIENT_CLINIC_OR_DEPARTMENT_OTHER): Payer: Medicare Other | Admitting: Oncology

## 2018-05-24 ENCOUNTER — Other Ambulatory Visit: Payer: Self-pay | Admitting: Family Medicine

## 2018-05-24 VITALS — BP 113/85 | HR 95 | Temp 98.0°F | Resp 18 | Ht 65.0 in | Wt 150.8 lb

## 2018-05-24 DIAGNOSIS — C562 Malignant neoplasm of left ovary: Secondary | ICD-10-CM

## 2018-05-24 DIAGNOSIS — K7460 Unspecified cirrhosis of liver: Secondary | ICD-10-CM

## 2018-05-24 DIAGNOSIS — C772 Secondary and unspecified malignant neoplasm of intra-abdominal lymph nodes: Secondary | ICD-10-CM | POA: Diagnosis not present

## 2018-05-24 DIAGNOSIS — C569 Malignant neoplasm of unspecified ovary: Secondary | ICD-10-CM

## 2018-05-24 DIAGNOSIS — G63 Polyneuropathy in diseases classified elsewhere: Secondary | ICD-10-CM

## 2018-05-24 DIAGNOSIS — R911 Solitary pulmonary nodule: Secondary | ICD-10-CM | POA: Diagnosis not present

## 2018-05-24 DIAGNOSIS — C801 Malignant (primary) neoplasm, unspecified: Secondary | ICD-10-CM

## 2018-05-24 DIAGNOSIS — R599 Enlarged lymph nodes, unspecified: Secondary | ICD-10-CM

## 2018-05-24 DIAGNOSIS — Z170 Estrogen receptor positive status [ER+]: Secondary | ICD-10-CM

## 2018-05-24 DIAGNOSIS — I700 Atherosclerosis of aorta: Secondary | ICD-10-CM

## 2018-05-24 DIAGNOSIS — C50811 Malignant neoplasm of overlapping sites of right female breast: Secondary | ICD-10-CM

## 2018-05-24 DIAGNOSIS — C50011 Malignant neoplasm of nipple and areola, right female breast: Secondary | ICD-10-CM

## 2018-05-24 DIAGNOSIS — R161 Splenomegaly, not elsewhere classified: Secondary | ICD-10-CM

## 2018-05-24 DIAGNOSIS — Z87891 Personal history of nicotine dependence: Secondary | ICD-10-CM

## 2018-05-24 DIAGNOSIS — M810 Age-related osteoporosis without current pathological fracture: Secondary | ICD-10-CM

## 2018-05-24 DIAGNOSIS — R970 Elevated carcinoembryonic antigen [CEA]: Secondary | ICD-10-CM

## 2018-05-24 LAB — CBC WITH DIFFERENTIAL/PLATELET
Abs Immature Granulocytes: 0.01 10*3/uL (ref 0.00–0.07)
Basophils Absolute: 0 10*3/uL (ref 0.0–0.1)
Basophils Relative: 0 %
EOS PCT: 2 %
Eosinophils Absolute: 0.1 10*3/uL (ref 0.0–0.5)
HCT: 34.8 % — ABNORMAL LOW (ref 36.0–46.0)
Hemoglobin: 11.5 g/dL — ABNORMAL LOW (ref 12.0–15.0)
Immature Granulocytes: 0 %
Lymphocytes Relative: 18 %
Lymphs Abs: 0.8 10*3/uL (ref 0.7–4.0)
MCH: 33.4 pg (ref 26.0–34.0)
MCHC: 33 g/dL (ref 30.0–36.0)
MCV: 101.2 fL — ABNORMAL HIGH (ref 80.0–100.0)
MONOS PCT: 7 %
Monocytes Absolute: 0.3 10*3/uL (ref 0.1–1.0)
Neutro Abs: 3.3 10*3/uL (ref 1.7–7.7)
Neutrophils Relative %: 73 %
Platelets: 87 10*3/uL — ABNORMAL LOW (ref 150–400)
RBC: 3.44 MIL/uL — ABNORMAL LOW (ref 3.87–5.11)
RDW: 14.8 % (ref 11.5–15.5)
WBC: 4.5 10*3/uL (ref 4.0–10.5)
nRBC: 0 % (ref 0.0–0.2)

## 2018-05-24 LAB — COMPREHENSIVE METABOLIC PANEL
ALT: 17 U/L (ref 0–44)
AST: 37 U/L (ref 15–41)
Albumin: 2.8 g/dL — ABNORMAL LOW (ref 3.5–5.0)
Alkaline Phosphatase: 152 U/L — ABNORMAL HIGH (ref 38–126)
Anion gap: 8 (ref 5–15)
BILIRUBIN TOTAL: 2.4 mg/dL — AB (ref 0.3–1.2)
BUN: 7 mg/dL — ABNORMAL LOW (ref 8–23)
CALCIUM: 8.4 mg/dL — AB (ref 8.9–10.3)
CO2: 23 mmol/L (ref 22–32)
Chloride: 108 mmol/L (ref 98–111)
Creatinine, Ser: 0.6 mg/dL (ref 0.44–1.00)
GFR calc Af Amer: >60 mL/min (ref >60)
Glucose, Bld: 118 mg/dL — ABNORMAL HIGH (ref 70–99)
Potassium: 3.5 mmol/L (ref 3.5–5.1)
Sodium: 139 mmol/L (ref 135–145)
TOTAL PROTEIN: 6.4 g/dL — AB (ref 6.5–8.1)

## 2018-05-24 MED ORDER — HEPARIN SOD (PORK) LOCK FLUSH 100 UNIT/ML IV SOLN
500.0000 [IU] | Freq: Once | INTRAVENOUS | Status: AC
  Administered 2018-05-24: 500 [IU] via INTRAVENOUS
  Filled 2018-05-24: qty 5

## 2018-05-24 MED ORDER — SODIUM CHLORIDE 0.9% FLUSH
10.0000 mL | Freq: Once | INTRAVENOUS | Status: AC
  Administered 2018-05-24: 10 mL
  Filled 2018-05-24: qty 10

## 2018-05-24 MED ORDER — SODIUM CHLORIDE 0.9% FLUSH
10.0000 mL | Freq: Once | INTRAVENOUS | Status: DC
  Filled 2018-05-24: qty 10

## 2018-05-24 NOTE — Telephone Encounter (Signed)
Printed calendar and avs. °

## 2018-05-26 ENCOUNTER — Telehealth: Payer: Self-pay | Admitting: *Deleted

## 2018-05-26 NOTE — Telephone Encounter (Signed)
Husband call back in to check the status of this  [phone]

## 2018-05-26 NOTE — Telephone Encounter (Signed)
Received letter informing that [facility] drew blood for Lipid panel that you requested; results will be faxed directly to provider, forwarded to provider/SLS 01/15

## 2018-05-27 ENCOUNTER — Other Ambulatory Visit (INDEPENDENT_AMBULATORY_CARE_PROVIDER_SITE_OTHER): Payer: Medicare Other

## 2018-05-27 DIAGNOSIS — E1165 Type 2 diabetes mellitus with hyperglycemia: Secondary | ICD-10-CM | POA: Diagnosis not present

## 2018-05-27 DIAGNOSIS — N390 Urinary tract infection, site not specified: Secondary | ICD-10-CM | POA: Diagnosis not present

## 2018-05-27 LAB — POC URINALSYSI DIPSTICK (AUTOMATED)
Glucose, UA: NEGATIVE
Ketones, UA: NEGATIVE
NITRITE UA: POSITIVE
PROTEIN UA: POSITIVE — AB
Spec Grav, UA: 1.02 (ref 1.010–1.025)
Urobilinogen, UA: 2 E.U./dL — AB
pH, UA: 6.5 (ref 5.0–8.0)

## 2018-05-27 LAB — LIPID PANEL W/O CHOL/HDL RATIO
Cholesterol, Total: 115 mg/dL (ref 100–199)
HDL: 36 mg/dL — ABNORMAL LOW (ref >39)
LDL Calculated: 65 mg/dL (ref 0–99)
Triglycerides: 68 mg/dL (ref 0–149)
VLDL Cholesterol Cal: 14 mg/dL (ref 5–40)

## 2018-05-27 NOTE — Telephone Encounter (Signed)
Patient made aware.

## 2018-05-27 NOTE — Telephone Encounter (Signed)
Tetanus shot was sent to pharmacy

## 2018-05-28 ENCOUNTER — Telehealth: Payer: Self-pay | Admitting: *Deleted

## 2018-05-28 ENCOUNTER — Other Ambulatory Visit: Payer: Self-pay | Admitting: *Deleted

## 2018-05-28 DIAGNOSIS — E1165 Type 2 diabetes mellitus with hyperglycemia: Secondary | ICD-10-CM

## 2018-05-28 DIAGNOSIS — E785 Hyperlipidemia, unspecified: Secondary | ICD-10-CM

## 2018-05-28 DIAGNOSIS — E1169 Type 2 diabetes mellitus with other specified complication: Secondary | ICD-10-CM

## 2018-05-28 MED ORDER — SULFAMETHOXAZOLE-TRIMETHOPRIM 800-160 MG PO TABS: 1.0000 | ORAL_TABLET | Freq: Two times a day (BID) | ORAL | 0 refills | Status: AC

## 2018-05-28 MED ORDER — TETANUS-DIPHTH-ACELL PERTUSSIS 5-2-15.5 LF-MCG/0.5 IM SUSP: 0.5000 mL | Freq: Once | INTRAMUSCULAR | 0 refills | Status: AC

## 2018-05-28 MED ORDER — NONFORMULARY OR COMPOUNDED ITEM: 0 refills | Status: DC

## 2018-05-28 NOTE — Telephone Encounter (Signed)
Received Lab Report results from [facility]; forwarded to provider/SLS 01/17

## 2018-05-28 NOTE — Progress Notes (Deleted)
ba

## 2018-05-30 LAB — URINE CULTURE
MICRO NUMBER:: 64943
SPECIMEN QUALITY:: ADEQUATE

## 2018-05-31 ENCOUNTER — Other Ambulatory Visit: Payer: Self-pay | Admitting: *Deleted

## 2018-05-31 MED ORDER — PRAVASTATIN SODIUM 10 MG PO TABS: 10.0000 mg | ORAL_TABLET | Freq: Every day | ORAL | 1 refills | Status: DC

## 2018-05-31 NOTE — Progress Notes (Signed)
pra

## 2018-06-03 ENCOUNTER — Telehealth: Payer: Self-pay | Admitting: Family Medicine

## 2018-06-03 NOTE — Telephone Encounter (Signed)
Patient husband wanted to know what was nonformulary or compounded rx that was sent in the mail.  Advised that it was lab orders for 3 months from now to get drawn when they are due.

## 2018-06-03 NOTE — Telephone Encounter (Signed)
Copied from [place] [phone]. Topic: Quick Communication - See Telephone Encounter >> Jun 03, 2018  3:12 PM R[place]ford Nail, [place] wrote: CRM for notification. See Telephone encounter for: 06/03/18. Patient's husband calling and is requesting a call ASAP regarding a call he received a week or so ago about a medication the patient was supposed to stop taking. Please advise.

## 2018-06-08 ENCOUNTER — Ambulatory Visit
Admission: RE | Admit: 2018-06-08 | Discharge: 2018-06-08 | Disposition: A | Discharge status: Home / Self Care | Payer: Medicare Other | Source: Ambulatory Visit | Attending: Oncology | Admitting: Oncology

## 2018-06-08 DIAGNOSIS — C569 Malignant neoplasm of unspecified ovary: Secondary | ICD-10-CM

## 2018-06-08 DIAGNOSIS — C801 Malignant (primary) neoplasm, unspecified: Secondary | ICD-10-CM

## 2018-06-08 DIAGNOSIS — K7460 Unspecified cirrhosis of liver: Secondary | ICD-10-CM

## 2018-06-08 DIAGNOSIS — Z170 Estrogen receptor positive status [ER+]: Secondary | ICD-10-CM

## 2018-06-08 DIAGNOSIS — C772 Secondary and unspecified malignant neoplasm of intra-abdominal lymph nodes: Secondary | ICD-10-CM

## 2018-06-08 DIAGNOSIS — C50011 Malignant neoplasm of nipple and areola, right female breast: Secondary | ICD-10-CM

## 2018-06-08 DIAGNOSIS — G63 Polyneuropathy in diseases classified elsewhere: Secondary | ICD-10-CM

## 2018-06-08 DIAGNOSIS — R161 Splenomegaly, not elsewhere classified: Secondary | ICD-10-CM

## 2018-06-08 DIAGNOSIS — C50811 Malignant neoplasm of overlapping sites of right female breast: Secondary | ICD-10-CM

## 2018-06-09 ENCOUNTER — Other Ambulatory Visit: Payer: Self-pay | Admitting: Oncology

## 2018-06-10 ENCOUNTER — Other Ambulatory Visit: Payer: Self-pay | Admitting: Family Medicine

## 2018-06-24 ENCOUNTER — Other Ambulatory Visit: Payer: Self-pay | Admitting: Oncology

## 2018-06-24 DIAGNOSIS — H49889 Other paralytic strabismus, unspecified eye: Secondary | ICD-10-CM | POA: Insufficient documentation

## 2018-06-24 DIAGNOSIS — H209 Unspecified iridocyclitis: Secondary | ICD-10-CM

## 2018-06-24 DIAGNOSIS — H49881 Other paralytic strabismus, right eye: Secondary | ICD-10-CM

## 2018-06-24 NOTE — Progress Notes (Signed)
I was called by [name]'s ophthalmologist.  He saw her in May 25, 2018 and he tells me she has some eyelid retraction, and she has limited upward gaze.  He does not think this is a dorsal midbrain syndrome, but he suggested an MRI of the orbits.  He also suggested checking for Graves' disease.  We are placing an MRI of the orbits at this point and we will check her thyroid function at the next visit.

## 2018-06-25 ENCOUNTER — Encounter: Payer: Self-pay | Admitting: *Deleted

## 2018-06-25 LAB — COLOGUARD
Cologuard: NEGATIVE
Cologuard: NEGATIVE

## 2018-06-30 ENCOUNTER — Telehealth: Payer: Self-pay

## 2018-06-30 MED ORDER — LORAZEPAM 0.5 MG PO TABS: 0.5000 mg | ORAL_TABLET | Freq: Three times a day (TID) | ORAL | 0 refills | Status: DC

## 2018-06-30 NOTE — Telephone Encounter (Signed)
Per Dr. Jana Hakim okay for Ativan 0.5mg  1 hour prior to MRI - Okay for 10 tablets for future imaging.

## 2018-06-30 NOTE — Telephone Encounter (Signed)
Pt's husband called regarding MRI set up for 07/03/2018.  Pt is claustrophobic and had extreme anxiety the last time she had an MRI.  Requesting for medication to give prior to MRI to ensure she is able to complete test.  Nurse informed this would be reviewed with MD.  If approved will have Rx faxed to [place] per patient.

## 2018-07-01 ENCOUNTER — Telehealth: Payer: Self-pay | Admitting: *Deleted

## 2018-07-01 MED ORDER — DIAZEPAM 5 MG PO TABS: ORAL_TABLET | ORAL | 0 refills | Status: DC

## 2018-07-01 NOTE — Telephone Encounter (Signed)
This RN spoke with pt and her husband per concern with MRI and " last time she felt claustrophobic "  This RN discussed above and plan to use valium for procedure- instructions on use given.  Verified pharmacy- will obtain script and fax.

## 2018-07-02 ENCOUNTER — Encounter: Payer: Self-pay | Admitting: Family Medicine

## 2018-07-03 ENCOUNTER — Ambulatory Visit (HOSPITAL_COMMUNITY)
Admission: RE | Admit: 2018-07-03 | Discharge: 2018-07-03 | Disposition: A | Discharge status: Home / Self Care | Payer: Medicare Other | Source: Ambulatory Visit | Attending: Oncology | Admitting: Oncology

## 2018-07-03 DIAGNOSIS — H209 Unspecified iridocyclitis: Secondary | ICD-10-CM | POA: Insufficient documentation

## 2018-07-03 MED ORDER — GADOBUTROL 1 MMOL/ML IV SOLN
7.5000 mL | Freq: Once | INTRAVENOUS | Status: AC | PRN
  Administered 2018-07-03: 7.5 mL via INTRAVENOUS

## 2018-07-23 ENCOUNTER — Other Ambulatory Visit: Payer: Self-pay | Admitting: *Deleted

## 2018-07-23 MED ORDER — LORAZEPAM 0.5 MG PO TABS: 0.5000 mg | ORAL_TABLET | Freq: Three times a day (TID) | ORAL | 0 refills | Status: DC

## 2018-07-30 ENCOUNTER — Other Ambulatory Visit: Payer: Self-pay | Admitting: Oncology

## 2018-08-03 ENCOUNTER — Other Ambulatory Visit: Payer: Self-pay | Admitting: Gastroenterology

## 2018-08-03 ENCOUNTER — Other Ambulatory Visit: Payer: Self-pay | Admitting: Family Medicine

## 2018-08-03 ENCOUNTER — Other Ambulatory Visit: Payer: Self-pay | Admitting: *Deleted

## 2018-08-03 ENCOUNTER — Other Ambulatory Visit: Payer: Self-pay | Admitting: Oncology

## 2018-08-03 MED ORDER — ALPRAZOLAM 0.5 MG PO TABS: 0.5000 mg | ORAL_TABLET | Freq: Two times a day (BID) | ORAL | 0 refills | Status: DC | PRN

## 2018-08-03 NOTE — Telephone Encounter (Signed)
This came to my inbox, but appears to be a patient of yours.  - HD

## 2018-08-03 NOTE — Telephone Encounter (Signed)
Jan can you help refill. Thanks

## 2018-08-04 ENCOUNTER — Telehealth: Payer: Self-pay | Admitting: Oncology

## 2018-08-04 NOTE — Telephone Encounter (Signed)
GM PAL 4/6 moved fu moved to 4/10. Confirmed with patient.

## 2018-08-05 ENCOUNTER — Other Ambulatory Visit: Payer: Self-pay | Admitting: *Deleted

## 2018-08-10 ENCOUNTER — Inpatient Hospital Stay: Payer: Medicare Other | Attending: Oncology

## 2018-08-10 ENCOUNTER — Inpatient Hospital Stay: Payer: Medicare Other

## 2018-08-10 ENCOUNTER — Ambulatory Visit (HOSPITAL_COMMUNITY)
Admission: RE | Admit: 2018-08-10 | Discharge: 2018-08-10 | Disposition: A | Discharge status: Home / Self Care | Payer: Medicare Other | Source: Ambulatory Visit | Attending: Oncology | Admitting: Oncology

## 2018-08-10 ENCOUNTER — Other Ambulatory Visit: Payer: Self-pay

## 2018-08-10 DIAGNOSIS — H209 Unspecified iridocyclitis: Secondary | ICD-10-CM

## 2018-08-10 DIAGNOSIS — M818 Other osteoporosis without current pathological fracture: Secondary | ICD-10-CM

## 2018-08-10 DIAGNOSIS — C561 Malignant neoplasm of right ovary: Secondary | ICD-10-CM

## 2018-08-10 DIAGNOSIS — C50811 Malignant neoplasm of overlapping sites of right female breast: Secondary | ICD-10-CM | POA: Insufficient documentation

## 2018-08-10 DIAGNOSIS — C50011 Malignant neoplasm of nipple and areola, right female breast: Secondary | ICD-10-CM | POA: Diagnosis present

## 2018-08-10 DIAGNOSIS — C569 Malignant neoplasm of unspecified ovary: Secondary | ICD-10-CM

## 2018-08-10 DIAGNOSIS — Z95828 Presence of other vascular implants and grafts: Secondary | ICD-10-CM

## 2018-08-10 DIAGNOSIS — C772 Secondary and unspecified malignant neoplasm of intra-abdominal lymph nodes: Secondary | ICD-10-CM | POA: Insufficient documentation

## 2018-08-10 DIAGNOSIS — R161 Splenomegaly, not elsewhere classified: Secondary | ICD-10-CM

## 2018-08-10 DIAGNOSIS — Z170 Estrogen receptor positive status [ER+]: Secondary | ICD-10-CM | POA: Diagnosis present

## 2018-08-10 DIAGNOSIS — C562 Malignant neoplasm of left ovary: Secondary | ICD-10-CM | POA: Insufficient documentation

## 2018-08-10 DIAGNOSIS — G63 Polyneuropathy in diseases classified elsewhere: Secondary | ICD-10-CM | POA: Insufficient documentation

## 2018-08-10 DIAGNOSIS — K7460 Unspecified cirrhosis of liver: Secondary | ICD-10-CM | POA: Diagnosis present

## 2018-08-10 DIAGNOSIS — H49881 Other paralytic strabismus, right eye: Secondary | ICD-10-CM

## 2018-08-10 DIAGNOSIS — C801 Malignant (primary) neoplasm, unspecified: Secondary | ICD-10-CM

## 2018-08-10 LAB — COMPREHENSIVE METABOLIC PANEL
ALT: 24 U/L (ref 0–44)
AST: 53 U/L — ABNORMAL HIGH (ref 15–41)
Albumin: 3 g/dL — ABNORMAL LOW (ref 3.5–5.0)
Alkaline Phosphatase: 152 U/L — ABNORMAL HIGH (ref 38–126)
Anion gap: 12 (ref 5–15)
BUN: 7 mg/dL — ABNORMAL LOW (ref 8–23)
CO2: 20 mmol/L — ABNORMAL LOW (ref 22–32)
Calcium: 8.7 mg/dL — ABNORMAL LOW (ref 8.9–10.3)
Chloride: 108 mmol/L (ref 98–111)
Creatinine, Ser: 0.57 mg/dL (ref 0.44–1.00)
GFR calc non Af Amer: >60 mL/min (ref >60)
Glucose, Bld: 87 mg/dL (ref 70–99)
Potassium: 3.9 mmol/L (ref 3.5–5.1)
SODIUM: 140 mmol/L (ref 135–145)
Total Bilirubin: 2 mg/dL — ABNORMAL HIGH (ref 0.3–1.2)
Total Protein: 7.5 g/dL (ref 6.5–8.1)

## 2018-08-10 LAB — CBC WITH DIFFERENTIAL/PLATELET
Abs Immature Granulocytes: 0.01 10*3/uL (ref 0.00–0.07)
BASOS ABS: 0 10*3/uL (ref 0.0–0.1)
Basophils Relative: 0 %
EOS PCT: 2 %
Eosinophils Absolute: 0.1 10*3/uL (ref 0.0–0.5)
HEMATOCRIT: 35.9 % — AB (ref 36.0–46.0)
Hemoglobin: 11.7 g/dL — ABNORMAL LOW (ref 12.0–15.0)
Immature Granulocytes: 0 %
Lymphocytes Relative: 15 %
Lymphs Abs: 0.7 10*3/uL (ref 0.7–4.0)
MCH: 33.1 pg (ref 26.0–34.0)
MCHC: 32.6 g/dL (ref 30.0–36.0)
MCV: 101.4 fL — ABNORMAL HIGH (ref 80.0–100.0)
Monocytes Absolute: 0.3 10*3/uL (ref 0.1–1.0)
Monocytes Relative: 6 %
Neutro Abs: 3.6 10*3/uL (ref 1.7–7.7)
Neutrophils Relative %: 77 %
Platelets: 116 10*3/uL — ABNORMAL LOW (ref 150–400)
RBC: 3.54 MIL/uL — ABNORMAL LOW (ref 3.87–5.11)
RDW: 14.7 % (ref 11.5–15.5)
WBC: 4.7 10*3/uL (ref 4.0–10.5)
nRBC: 0 % (ref 0.0–0.2)

## 2018-08-10 MED ORDER — IOHEXOL 300 MG/ML  SOLN
100.0000 mL | Freq: Once | INTRAMUSCULAR | Status: AC | PRN
  Administered 2018-08-10: 100 mL via INTRAVENOUS

## 2018-08-10 MED ORDER — SODIUM CHLORIDE (PF) 0.9 % IJ SOLN
INTRAMUSCULAR | Status: AC
  Filled 2018-08-10: qty 50

## 2018-08-10 MED ORDER — HEPARIN SOD (PORK) LOCK FLUSH 100 UNIT/ML IV SOLN
INTRAVENOUS | Status: AC
  Filled 2018-08-10: qty 5

## 2018-08-10 MED ORDER — HEPARIN SOD (PORK) LOCK FLUSH 100 UNIT/ML IV SOLN
500.0000 [IU] | Freq: Once | INTRAVENOUS | Status: AC
  Administered 2018-08-10: 500 [IU] via INTRAVENOUS

## 2018-08-10 MED ORDER — SODIUM CHLORIDE 0.9% FLUSH
10.0000 mL | Freq: Once | INTRAVENOUS | Status: AC
  Administered 2018-08-10: 10 mL
  Filled 2018-08-10: qty 10

## 2018-08-11 ENCOUNTER — Telehealth: Payer: Self-pay

## 2018-08-11 LAB — THYROID PANEL WITH TSH
FREE THYROXINE INDEX: 2.9 (ref 1.2–4.9)
T3 Uptake Ratio: 28 % (ref 24–39)
T4, Total: 10.2 ug/dL (ref 4.5–12.0)
TSH: 0.581 u[IU]/mL (ref 0.450–4.500)

## 2018-08-11 LAB — CA 125: Cancer Antigen (CA) 125: 225 U/mL — ABNORMAL HIGH (ref 0.0–38.1)

## 2018-08-11 NOTE — Telephone Encounter (Signed)
Husband requesting ensure coupons. Out in the office, but requested Haylee (rep) overnight them to office and then will mail to pt. Husband understands and expresses appreciation.

## 2018-08-11 NOTE — Telephone Encounter (Signed)
Copied from [place] [id number]. Topic: General - Other >> Aug 11, 2018  1:05 PM Ivar Drape wrote: Reason for CRM:   Patient's spouse, Luis Sami [phone] would like a call back from the [facility] nurse.  He wanted to ask her a question about his wife.

## 2018-08-11 NOTE — Telephone Encounter (Signed)
Left msg

## 2018-08-12 ENCOUNTER — Other Ambulatory Visit: Payer: Medicare Other

## 2018-08-16 ENCOUNTER — Ambulatory Visit: Payer: Medicare Other | Admitting: Oncology

## 2018-08-17 ENCOUNTER — Telehealth: Payer: Self-pay

## 2018-08-17 ENCOUNTER — Telehealth: Payer: Self-pay | Admitting: Pharmacist

## 2018-08-17 ENCOUNTER — Other Ambulatory Visit: Payer: Self-pay | Admitting: Oncology

## 2018-08-17 ENCOUNTER — Telehealth: Payer: Self-pay | Admitting: Oncology

## 2018-08-17 ENCOUNTER — Other Ambulatory Visit: Payer: Self-pay | Admitting: Family Medicine

## 2018-08-17 MED ORDER — TAMOXIFEN CITRATE 20 MG PO TABS: 20.0000 mg | ORAL_TABLET | Freq: Every day | ORAL | 12 refills | Status: DC

## 2018-08-17 MED ORDER — PALBOCICLIB 100 MG PO CAPS: 100.0000 mg | ORAL_CAPSULE | Freq: Every day | ORAL | 6 refills | Status: DC

## 2018-08-17 NOTE — Telephone Encounter (Signed)
Spoke with the patient's husband and daughter. Got the app set up on his phone and verified that he had received the email.

## 2018-08-17 NOTE — Progress Notes (Signed)
I called [name] and spoke with from the scans which really are generally stable one lymph node has grown significantly.  Of course we are not sure what that means.  It could be ovarian cancer, breast cancer, a low-grade non-Hodgkin's lymphoma, or totally unrelated to cancer.  Nevertheless with her tumor markers rising I think maybe it is to make a change and I would like to start her on tamoxifen and palbociclib.  I am going to not start her on 25 mg palbociclib dose because we are not going to be able to do a little lab work for the next month or 6 weeks.  I will call them and have a WebEx meeting 08/20/2018 to discuss this further.  Hopefully we can start her new medications 08/23/2018.

## 2018-08-17 NOTE — Telephone Encounter (Signed)
Oral Oncology Pharmacist Encounter  Received new prescription for Ibrance (palbociclib) for the treatment of recurrent, hormone receptor positive breast cancer in conjunction with tamoxifen, planned duration until disease progression or unacceptable toxicity.  Original diagnosis in Jan 2014 with stage IIIA disease Patient received adjuvant docetaxel and cyclophosphamide Jan -March 2014, discontinued early after 2 cycles due to multiple severe complications, and was started on extended adjuvant endocrine treatment with letrozole Osteoporosis subsequently noted due to treatment with aromatase inhibitor  Patient diagnosed with stage IIIC ovarian cancer in Feb 2016 Patient received adjuvant carboplatin and paclitaxel (later substituted with nab-paclitaxel) [name] 2016  Recent imaging shows slight progression of disease and tumor markers are rising. Patient is now under evaluation to initiate front line endocrine based treatment for recurrent breast cancer with Ibrance and tamoxifen. [name] is planned to be initiated at reduced dose of 100mg  once daily for 3 weeks on and 2 week off to remove need for ANC checks during COVID-19 pandemic.  Labs from 08/10/18 assessed, OK for treatment.  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 Conflicting evidence exists regarding this interaction and it's effect on efficacy. Will discuss  interaction with MD.  Prescription has been e-scribed to the [facility] for benefits analysis and approval by MD.  [facility] will continue to follow for insurance authorization, copayment issues, initial counseling and start date.  Johny Drilling, PharmD, BCPS, BCOP  08/17/2018 10:13 AM Oral Oncology Clinic [phone]

## 2018-08-17 NOTE — Telephone Encounter (Signed)
Oral Oncology Patient Advocate Encounter  Was successful in securing patient a $15,000 grant from [employer] to provide copayment coverage for [place]. This will keep the out of pocket expense at $0.   I have spoken with the patient..   The billing information is as follows and has been shared with [place].   Member ID: [id number] Group ID: [id number] RxBin: [id number] Dates of Eligibility: 07/18/18 through 07/17/19  [place] [facility] Phone [phone] Fax [phone] 08/17/2018    2:01 PM

## 2018-08-18 ENCOUNTER — Other Ambulatory Visit: Payer: Self-pay | Admitting: Oncology

## 2018-08-18 MED ORDER — TOREMIFENE CITRATE 60 MG PO TABS: 60.0000 mg | ORAL_TABLET | Freq: Every day | ORAL | 4 refills | Status: DC

## 2018-08-19 ENCOUNTER — Telehealth: Payer: Self-pay | Admitting: Pharmacist

## 2018-08-19 DIAGNOSIS — Z170 Estrogen receptor positive status [ER+]: Principal | ICD-10-CM

## 2018-08-19 DIAGNOSIS — C50811 Malignant neoplasm of overlapping sites of right female breast: Secondary | ICD-10-CM

## 2018-08-19 MED ORDER — TOREMIFENE CITRATE 60 MG PO TABS: 60.0000 mg | ORAL_TABLET | Freq: Every day | ORAL | 4 refills | Status: DC

## 2018-08-19 NOTE — Telephone Encounter (Signed)
Oral Chemotherapy Pharmacist Encounter  I spoke with patient for overview of: Ibrance for the treatment hormone receptor positive breast cancer in conjunction with toremifene, planned duration until disease progression or unacceptable toxicity.  Counseled patient on administration, dosing, side effects, monitoring, drug-food interactions, safe handling, storage, and disposal.  Patient will take Ibrance 100 mg capsules, 1 capsule by mouth once daily with breakfast for 3 weeks on, 1 week off. Explained that the toremifene does not have a treatment break and will be taken continuously.   Patient informed that [facility] is changing to tablet formulation and will be dispensed in a box of 3 cards as opposed to current dispensing of a bottle with 21 capsules. Current supply of capsules at pharmacies and wholesalers will be exhausted prior to patients receiving tablet formulation, which will occur over April and May of 2020.   Patient knows to avoid grapefruit and grapefruit juice.This is not something they currently purchase or eat/ drink and should not be an issue.   Ibrance start date: anticipated 08/23/2018; will be shipped on 08/20/2018  Adverse effects include but are not limited to: fatigue, hair loss, GI upset, nausea, decreased blood counts, and increased upper respiratory infections. Severe, life-threatening, and/or fatal interstitial lung disease (ILD) and/or pneumonitis may occur with CDK 4/6 inhibitors. Patient's husband had many questions about hair loss. I explained that the hair loss is most often thinning of hair, but can be total hair loss and can be for the whole body not limited to head hair loss.   Patient will obtain anti diarrheal and alert the office of 4 or more loose stools above baseline.  Patient reminded of empiric dose reduction to avoid need for ANC check due to COVID-19 pandemic. He will call office if there is any concern for infection.   Reviewed with patient importance  of keeping a medication schedule and plan for any missed doses.  Patient's husband voiced understanding and appreciation.   All questions answered. Patient's husband with many questions regarding her care and had limited understanding of why these medications were being initiated. Explained concern in rising tumor marker and increased lymph node to patient's husband that was noted in Dr. Delight Stare note. He also mentioned he has a call with MD tomorrow and I urged him to gain more clarification through Dr. Jana Hakim if needed at that time.   Medication reconciliation performed and medication/allergy list updated.  Will follow up with patient regarding insurance and pharmacy.   Patient knows to call the office with questions or concerns. Oral Oncology Clinic will continue to follow.  Jalene Mullet, PharmD PGY2 Hematology/ Oncology Pharmacy Resident [facility] [phone] 08/19/2018 8:41 AM

## 2018-08-19 NOTE — Telephone Encounter (Signed)
Oral Oncology Pharmacist Encounter  MD to prescribe toremifene in place of tamoxifen due to interaction with buproprion.  Ibrance and toremifene will ship from the [facility] tomorrow 08/20/18 for delivery to patient's home on 4/11 or 4/13.  Ibrance copayment is $2686.40 for 28 day supply Tormefine copayment ~$700 for 90-day supply. Both copayments fully covered by [employer]. Out of pocket cost for patient $0  Johny Drilling, PharmD, BCPS, BCOP  08/19/2018 10:04 AM Oral Oncology Clinic [phone]

## 2018-08-20 ENCOUNTER — Inpatient Hospital Stay: Payer: Medicare Other | Attending: Oncology | Admitting: Oncology

## 2018-08-20 DIAGNOSIS — M810 Age-related osteoporosis without current pathological fracture: Secondary | ICD-10-CM

## 2018-08-20 DIAGNOSIS — R971 Elevated cancer antigen 125 [CA 125]: Secondary | ICD-10-CM

## 2018-08-20 DIAGNOSIS — C772 Secondary and unspecified malignant neoplasm of intra-abdominal lymph nodes: Secondary | ICD-10-CM

## 2018-08-20 DIAGNOSIS — G63 Polyneuropathy in diseases classified elsewhere: Secondary | ICD-10-CM

## 2018-08-20 DIAGNOSIS — C50012 Malignant neoplasm of nipple and areola, left female breast: Secondary | ICD-10-CM

## 2018-08-20 DIAGNOSIS — C569 Malignant neoplasm of unspecified ovary: Secondary | ICD-10-CM

## 2018-08-20 DIAGNOSIS — Z170 Estrogen receptor positive status [ER+]: Secondary | ICD-10-CM

## 2018-08-20 DIAGNOSIS — C50011 Malignant neoplasm of nipple and areola, right female breast: Secondary | ICD-10-CM

## 2018-08-20 DIAGNOSIS — C50811 Malignant neoplasm of overlapping sites of right female breast: Secondary | ICD-10-CM | POA: Diagnosis not present

## 2018-08-20 DIAGNOSIS — C801 Malignant (primary) neoplasm, unspecified: Secondary | ICD-10-CM

## 2018-08-20 MED FILL — TOREMIFENE CITRATE 60 MG TA: 60 | 90 days supply | Qty: 90 | Fill #0

## 2018-08-20 MED FILL — IBRANCE 100 MG CAPSULE: 100 | 28 days supply | Qty: 21 | Fill #0

## 2018-08-20 NOTE — Progress Notes (Signed)
[facility]  [phone] [phone]    ID: NEFTALI THUROW   DOB: Oct 30, 1955  MR#: [id number]  XKG#:[id number]  Patient Care Team: Carollee Herter, Alferd Apa, DO as PCP - General (Family Medicine) Sandrina Heaton, Virgie Dad, MD as Consulting Physician (Oncology) Excell Seltzer, MD as Consulting Physician (General Surgery) Everitt Amber, MD as Consulting Physician (Obstetrics and Gynecology) Gaynelle Arabian, MD as Consulting Physician (Orthopedic Surgery) Armbruster, Carlota Raspberry, MD as Consulting Physician (Gastroenterology) Garald Balding, MD as Consulting Physician (Orthopedic Surgery)  OTHER MD: Daniel Nones, MD; Alger Simons, MD  CHIEF COMPLAINT:  Estrogen receptor positive Breast Cancer; Ovarian cancer; DVT  CURRENT TREATMENT: Toremifene, palbociclib  INTERVAL HISTORY: I connected with [name] on 08/20/18 at 65:00 AM EDT by video enabled telemedicine visit and verified that I am speaking with the correct person using two identifiers.   I discussed the limitations, risks, security and privacy concerns of performing an evaluation and management service by telemedicine and the availability of in-person appointments. I also discussed with the patient that there may be a patient responsible charge related to this service. The patient expressed understanding and agreed to proceed.   Other persons participating in the visit and their role in the encounter: Husband Don  Patients location: Per home Providers location: Clinic  We have been concerned because Ronique tumor markers have been rising. Results for MARLA, POULIOT (MRN [id number]) as of 08/20/2018 09:49  Ref. Range 01/12/2018 65:11 02/23/2018 65:38 03/15/2018 65:58 05/17/2018 65:32 08/10/2018 65:34  CA 27.29 Latest Ref Range: 0.0 - 38.6 U/mL    39.7 (H)   Cancer Antigen (CA) 125 Latest Ref Range: 0.0 - 38.1 U/mL 160.0 (H) 181.0 (H) 137.0 (H) 193.0 (H) 225.0 (H)   Accordingly on 08/10/2018 we obtained a  CT of the abdomen pelvis and chest.  This showed a left periaortic lymph node which had grown from 2.4 cm to 3.1 cm in the past 4 months.  Everything else was stable as far as cancer diagnosis is concerned.  She has significant cirrhosis, splenorenal shunting, coronary atherosclerosis and aortic atherosclerosis, and a very small persistent right pleural effusion, and nonobstructive nephrolithiasis.  She has very mild ascites.  None of that is changed.  Given the increase in the lymph node on the right and the tumor markers were going to make some changes and that was the point of the visit today.  Also noted Shakedra's last bone density screening on 08/15/63, showed a T-score of -2.7, which is considered osteoporotic.      REVIEW OF SYSTEMS: Evia and her husband [name] have been taking appropriate pandemic precautions.  She pretty much stays in the house all day, plays with the dog and watches TV.  She says the dog will not let her out of his side.  Then goes out to do shopping, when he comes home he takes off his clothes washes them and takes a shower.  Trinady denies unusual headaches visual changes cough phlegm production pleurisy or shortness of breath fever rash or bleeding.  A detailed review of systems today was otherwise stable.    BREAST CANCER HISTORY: From the original consult note:  Lielle noted a mass in her right breast mid December 2013, and as it did not spontaneously resolve over a couple of weeks she brought it to her primary physician's attention. She was set up for diagnostic mammography and right breast ultrasonography at the breast Center 05/10/2012. (Note that the patient's most recent prior mammography  had been in October 2008). The current study showed a spiculated mass in the superior subareolar portion of the right breast measuring approximately 5 cm and associated with pleomorphic calcifications. This was firm and palpable. There was right nipple retraction and skin thickening.  Ultrasound confirmed an irregularly marginated hypoechoic mass measuring 3.5 cm by ultrasound, and an abnormal appearing lower right axillary lymph node measuring 2.6 cm.  Biopsies of both the breast mass and the abnormal appearing lymph node were performed 05/21/2012. Both showed an invasive ductal carcinoma, grade 2, with similar prognostic panels (the breast mass was 100% estrogen and 73% progesterone receptor positive, with an MIB-1 of 5%; the lymph node was 100% estrogen 100% progesterone receptor positive, with an MIB-1 of 20%). Both masses were HER-2 negative.  Breast MRI obtained at [facility] imaging 05/29/2012 confirmed a dominant mass in the retroareolar right breast measuring 4.4 cm maximally. There was a satellite nodule inferior and lateral to this mass, measuring 1.7 cm. There were no other masses in either breast. Aside from the previously biopsied lymph node there were other mildly enhancing level I right axillary lymph nodes which did not appear pathologic. There was no other lymphadenopathy noted.   The patient's subsequent history is as detailed below.  OVARIAN CANCER HISTORY: From the 06/16/2014 summary note:  "Jerilynn returns today for review of her restaging studies accompanied by her husband [name]. To summarize her recent history: She was admitted to [facility] with a diagnosis of possible pneumonia in late December 2015. Chest x-ray showed some increased density in the right upper lobe. CT scan was suggested and was obtained by Dr. Inda Castle. This showed 2 small nodules in the right middle lobe, measuring 8 and 4 mm respectively. Dr. Inda Castle then contacted [name] for further evaluation and we set Audine up for a PET scan and which was performed late January. This showed the 2 nodules in the lung not to be hypermetabolic. This of course does not prove that they are not malignant. More importantly, there was a 13.7 cm cystic/solid mass in the upper pelvis associated  with adenopathy,. All of this was hypermetabolic."  The patient was evaluated by gynecologic oncology and on 06/27/2014 she underwent surgery under Dr. Denman George. This consisted of an exploratory laparotomy with hysterectomy and abdominal salpingo-oophorectomy, as well as tumor debulking and omentectomy. 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 The tumor measured 11.5 cm, focally involve the ovarian capsule on the left; the uterus and right ovary and fallopian tube were unremarkable. One para-aortic lymph node was positive out of a total of 6 lymph nodes sampled (2 left common iliac, 2 left para-aortic, and to within the omental resection).  Her subsequent history is as detailed below   PAST MEDICAL HISTORY: Past Medical History:  Diagnosis Date   Allergy    Anemia    low iron hx   Arthritis    Breast cancer ([place]) 2014   a. Right - invasive ductal carcinoma with 2/18 lymph nodes involved (pT3, pN1a, stage IIIA), s/p R mastectomy 06/08/12, chemo (not well tolerated->d/c)   Cataract    removed ou   Chronic combined systolic and diastolic CHF (congestive heart failure) ([place])    a. 08/2012 Echo: EF 55-60%, no rwma, mod MR;  b. 07/2014 Echo: EF 45-50%, distal antsept HK, mod TR/MR, mildly bil-atrial enlargement.   Clear Cell Ovarian Cancer    a. 06/2014 s/p TAH/BSO debulking/lymph  node dissection;  b. Now on chemo.   Depression    Diabetes mellitus    DJD (degenerative joint disease) of lumbar spine    DVT (deep venous thrombosis) (HCC)    hx of on HRT left leg ~2006   Gallstones    GERD (gastroesophageal reflux disease)    Hyperlipidemia    Hypertension    Hypothyroidism    Liver disease, chronic, with cirrhosis ([place])    ? autoimmune   OSA on CPAP    cpap 4.5 setting   Paroxysmal atrial fibrillation (HCC)    a. CHA2DS2VASc = 4-->coumadin.   Physical deconditioning    PPD positive,  treated    rx inh    Sleep apnea     PAST SURGICAL HISTORY: Past Surgical History:  Procedure Laterality Date   ABDOMINAL HYSTERECTOMY N/A 06/27/2014   Procedure: HYSTERECTOMY ABDOMINAL TOTAL;  Surgeon: Everitt Amber, MD;  Location: [facility];  Service: Gynecology;  Laterality: N/A;   BREAST BIOPSY     left breast   BREAST EXCISIONAL BIOPSY Left 2002   CATARACT EXTRACTION     CHOLECYSTECTOMY     EYE SURGERY     FOOT SURGERY     history of chemotherapy x 2 treatments, radiation tx  2014   LAPAROTOMY N/A 06/27/2014   Procedure: EXPLORATORY LAPAROTOMY;  Surgeon: Everitt Amber, MD;  Location: [facility];  Service: Gynecology;  Laterality: N/A;   MASTECTOMY Right 2014   MASTECTOMY MODIFIED RADICAL  06/08/2012   Procedure: MASTECTOMY MODIFIED RADICAL;  Surgeon: Edward Jolly, MD;  Location: [name];  Service: General;  Laterality: Right;   pac removed  2015   PEG TUBE PLACEMENT     peg removed 2015   PERCUTANEOUS LIVER BIOPSY     PORTACATH PLACEMENT  06/08/2012   Procedure: INSERTION PORT-A-CATH;  Surgeon: Edward Jolly, MD;  Location: [facility];  Service: General;  Laterality: Left;   SALPINGOOPHORECTOMY Bilateral 06/27/2014   Procedure: BILATERAL SALPINGO OOPHORECTOMY/TUMOR DEBULKING/LYMPHNODE DISSECTION, OMENTECTOMY;  Surgeon: Everitt Amber, MD;  Location: [facility];  Service: Gynecology;  Laterality: Bilateral;   TUBAL LIGATION      FAMILY HISTORY Family History  Problem Relation Age of Onset   Diabetes Mother    Hypertension Mother    Arthritis Mother    Heart disease Mother    Heart failure Mother    Other Mother        benign breast mass   Stroke Father    Heart disease Father    Dementia Father    Diabetes Paternal Grandmother    Colon cancer Paternal Grandfather        dx. 50s-80s   Colon cancer Maternal Grandfather    Other Daughter        potentially has had negative BRCA testing   Other Daughter        hysterectomy; potentially has had  negative genetic testing   Rectal cancer Neg Hx    Esophageal cancer Neg Hx    Liver cancer Neg Hx    The patient's father died in his 66s with a history of dementia. He had had prior strokes. The patient's mother died in her 42s, with a history of congestive heart failure. Jalee had no brothers, one sister. There is no history of breast or ovarian cancer in the family.   GYNECOLOGIC HISTORY: Menarche age 15, first live birth age 68, she is GX P2, menopause approximately 15 years ago, on hormone replacement until 2010.   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 Her husband [name] works for [employer]. Daughter Paul Half is a Physiological scientist and lives in [place]. Daughter Santiago Bur and her family (husband and 2 children aged 17 and 3 years) currently live with the patient. Emmani is a member of a [employer].     ADVANCED DIRECTIVES: Not in place   HEALTH MAINTENANCE: (Updated October 2014) Social History   Tobacco Use   Smoking status: Former Smoker    Packs/day: 1.00    Years: 1.00    Pack years: 1.00   Smokeless tobacco: Former Systems developer  Substance Use Topics   Alcohol use: No   Drug use: No    Comment: quit age 21 only smoked as a teen 1 year     Colonoscopy: Never  PAP: Does not recall  Bone density:  08/14/2017 showed a T score of -2.7 osteoporosis  Lipid panel:   Allergies  Allergen Reactions   Olmesartan Medoxomil Cough   Adhesive [Tape] Rash   Tetracycline Hcl Other (See Comments)   Venlafaxine Other (See Comments)    severe dry mouth    Current Outpatient Medications  Medication Sig Dispense Refill   acetaminophen (TYLENOL) 500 MG tablet Take 500 mg by mouth every 6 (six) hours as needed for moderate pain.     ALPRAZolam (XANAX) 0.5 MG tablet Take 1 tablet (0.5 mg total) by mouth 2 (two) times daily as needed for  anxiety. 180 tablet 0   Blood Glucose Monitoring Suppl (ONETOUCH VERIO) w/Device KIT Use as directed twice a day.  Dx code: E11.9 1 kit 0   buPROPion (WELLBUTRIN XL) 150 MG 24 hr tablet TAKE 1 TABLET BY MOUTH  DAILY 90 tablet 1   clotrimazole (MYCELEX) 10 MG troche Take 1 tablet (10 mg total) by mouth 3 (three) times daily. 30 tablet 0   diazepam (VALIUM) 5 MG tablet Pt to take 1 tab 1 hour before MRI and may repeat if needed 2 tablet 0   escitalopram (LEXAPRO) 20 MG tablet TAKE 1 TABLET BY MOUTH  EVERY MORNING 90 tablet 1   famotidine (PEPCID) 20 MG tablet Take 1 tablet (20 mg total) by mouth daily. (Patient not taking: Reported on 05/18/2018) 30 tablet 0   furosemide (LASIX) 20 MG tablet TAKE 1 TABLET BY MOUTH  DAILY 90 tablet 0   glimepiride (AMARYL) 2 MG tablet TAKE 1 TABLET BY MOUTH TWO  TIMES DAILY 180 tablet 0   glucose blood (ONETOUCH VERIO) test strip USE TO CHECK BLOOD SUGAR TWICE DAILY 200 each 1   ketoconazole (NIZORAL) 2 % cream Apply 1 application topically daily. 15 g 3   levothyroxine (SYNTHROID, LEVOTHROID) 150 MCG tablet TAKE 1 TABLET BY MOUTH  DAILY 90 tablet 1   lidocaine-prilocaine (EMLA) cream APPLY TOPICALLY OVER PORT  AREA 1-2 HOURS BEFORE  CHEMOTHERAPY AS NEEDED 30 g 1   LORazepam (ATIVAN) 0.5 MG tablet Take 1 tablet (0.5 mg total) by mouth every 8 (eight) hours. Ativan 0.75m - Take 1 tablet prior to MRI imaging - may repeat if needed for anxiety. 30 tablet 0   metFORMIN (GLUCOPHAGE-XR) 500 MG 24 hr tablet TAKE 2 TABLETS BY MOUTH  EVERY EVENING 180 tablet 1   metroNIDAZOLE (METROGEL) 1 % gel Apply topically daily. 45 g 0   nadolol (CORGARD) 40 MG tablet Take 1 tablet (40 mg total) by mouth daily. 90 tablet 3  NONFORMULARY OR COMPOUNDED ITEM Lipid panel   Dx code: E78.5, CMP Dx Code: I10,  A1C Dx code: E11.9 nonfasting is ok 1 each 0   nystatin (MYCOSTATIN) 100000 UNIT/ML suspension Take 10 mLs (1,000,000 Units total) by mouth 4 (four) times daily. 240 mL 0    OLANZapine (ZYPREXA) 2.5 MG tablet TAKE 1 TABLET BY MOUTH  NIGHTLY AT BEDTIME 90 tablet 1   omeprazole (PRILOSEC) 20 MG capsule Take 1 capsule (20 mg total) by mouth daily. (Patient not taking: Reported on 05/18/2018) 90 capsule 3   ondansetron (ZOFRAN) 8 MG tablet Take 1 tablet (8 mg total) by mouth every 8 (eight) hours as needed for nausea or vomiting. 270 tablet 0   ONETOUCH DELICA LANCETS 78G MISC USE TO CHECK BLOOD SUGAR TWICE DAILY.  Dx code: E11.9 200 each 1   oxybutynin (DITROPAN) 5 MG tablet Take 1 tablet (5 mg total) by mouth 3 (three) times daily. 270 tablet 03   palbociclib (IBRANCE) 100 MG capsule Take 1 capsule (100 mg total) by mouth daily with breakfast. Take whole with food. Take for 21 days on, 7 days off, repeat every 28 days. 21 capsule 6   potassium chloride (K-DUR,KLOR-CON) 10 MEQ tablet TAKE 1 TABLET BY MOUTH TWO  TIMES DAILY 180 tablet 1   pravastatin (PRAVACHOL) 10 MG tablet TAKE 1 TABLET BY MOUTH  DAILY 90 tablet 1   propranolol (INDERAL) 20 MG tablet TAKE 1 TABLET BY MOUTH TWO  TIMES DAILY 180 tablet 3   Toremifene Citrate (FARESTON) 60 MG tablet Take 1 tablet (60 mg total) by mouth daily. 90 tablet 4   No current facility-administered medications for this visit.     OBJECTIVE: Middle-aged white woman in no acute distress There were no vitals filed for this visit.   There is no height or weight on file to calculate BMI.    ECOG FS: 1 There were no vitals filed for this visit.    Lab Results  Component Value Date   WBC 4.7 08/10/2018   NEUTROABS 3.6 08/10/2018   HGB 11.7 (L) 08/10/2018   HCT 35.9 (L) 08/10/2018   MCV 101.4 (H) 08/10/2018   PLT 116 (L) 08/10/2018     Chemistry      Component Value Date/Time   NA 140 08/10/2018 0934   NA 140 04/28/2017 0809   K 3.9 08/10/2018 0934   K 3.4 (L) 04/28/2017 0809   CL 108 08/10/2018 0934   CL 104 10/29/2012 1058   CO2 20 (L) 08/10/2018 0934   CO2 22 04/28/2017 0809   BUN 7 (L) 08/10/2018 0934    BUN 6.5 (L) 04/28/2017 0809   CREATININE 0.57 08/10/2018 0934   CREATININE 0.6 04/28/2017 0809      Component Value Date/Time   CALCIUM 8.7 (L) 08/10/2018 0934   CALCIUM 8.4 04/28/2017 0809   ALKPHOS 152 (H) 08/10/2018 0934   ALKPHOS 143 04/28/2017 0809   AST 53 (H) 08/10/2018 0934   AST 53 (H) 04/28/2017 0809   ALT 24 08/10/2018 0934   ALT 21 04/28/2017 0809   BILITOT 2.0 (H) 08/10/2018 0934   BILITOT 2.96 (H) 04/28/2017 0809      STUDIES: Ct Chest W Contrast  Result Date: 08/10/2018 CLINICAL DATA:  Restaging of breast cancer and ovarian cancer. EXAM: CT CHEST, ABDOMEN, AND PELVIS WITH CONTRAST TECHNIQUE: Multidetector CT imaging of the chest, abdomen and pelvis was performed following the standard protocol during bolus administration of intravenous contrast. CONTRAST:  151m OMNIPAQUE IOHEXOL 300  MG/ML  SOLN COMPARISON:  Multiple exams, including 04/02/2018 PET-CT FINDINGS: CT CHEST FINDINGS Cardiovascular: Left Port-A-Cath tip: Distal SVC. Atherosclerotic calcification of the aortic arch and coronary arteries with mild cardiomegaly. Distal paraesophageal uphill varices. Mediastinum/Nodes: Scattered mediastinal nodes are present. A prevascular node measures 0.9 cm in short axis on image 17/2, formerly by my measurements the same on the PET-CT from 04/02/2018. Right lower paratracheal node 1.1 cm in short axis on image 18/2, formerly 1.2 cm. Subcarinal node 1.8 cm in short axis on image 24/2, previously the same by my measurements. Upper normal sized bilateral hilar and infrahilar lymph nodes are present. Small left axillary and left subpectoral lymph nodes are observed, slightly reduced in prominence, with a left axillary lymph node on image 11/2 measuring 0.5 cm in short axis, previously 0.7 cm. Lungs/Pleura: Small persistent right pleural effusion. Peripheral interstitial accentuation favoring the upper lobes. Confluent scarring at the right lung apex. Possible radiation therapy related  findings anteriorly in the right middle lobe and right lower lobe. Faint interstitial accentuation at the lung bases, subtle interstitial edema is not excluded. 5 mm right middle lobe pulmonary nodule on image 82/6, stable. This is just below the minor fissure. Musculoskeletal: Right mastectomy. CT ABDOMEN PELVIS FINDINGS Hepatobiliary: Nodular hepatic contour with morphologic findings characteristic of hepatic cirrhosis. Cholecystectomy. No significant degree of biliary dilatation. No significant focal hepatic lesion. Pancreas: Unremarkable Spleen: Splenomegaly, spleen measures 16.8 by 7.9 by 14.6 cm (volume = 1000 cm^3). Adrenals/Urinary Tract: Both adrenal glands appear normal. Borderline urinary bladder wall thickening diffusely. Left mid kidney cyst anteriorly. 5 by 2 mm right mid kidney nonobstructive renal calculus on image 106/4. Stomach/Bowel: Unremarkable Vascular/Lymphatic: Splenorenal shunting. The portal vein appears patent but with some peripheral calcification along its anterior margin. Aortoiliac atherosclerotic vascular disease. Left periaortic lymph node 3.1 cm in short axis on image 70/2, formerly 2.4 cm. Stable reactive porta hepatis lymph nodes. Aortoiliac atherosclerotic vascular disease. Reproductive: Uterus absent.  Adnexa unremarkable. Other: Mild upper abdominal ascites. Subtle infiltration of the omentum and mesentery likely from edema, less likely to be from peritoneal metastatic deposits. Trace pelvic ascites. Stable mild edema in the perirectal space. Musculoskeletal: Prominent left and mild right degenerative hip arthropathy. Spondylosis and degenerative disc disease at L5-S1. IMPRESSION: 1. The dominant left periaortic lymph node has significantly increased in size, currently 3.1 cm in short axis, formerly 2.4 cm, compatible with malignancy. 2. There is notable hepatic cirrhosis with splenorenal shunting and uphill paraesophageal varices as well as some reactive lymph nodes along the  porta hepatis. 3. Mildly prominent mediastinal lymph nodes are roughly stable compared to the prior exam. 4. Other imaging findings of potential clinical significance: Aortic Atherosclerosis (ICD10-I70.0). Coronary atherosclerosis. Small persistent right pleural effusion. Cardiomegaly with interstitial accentuation in the lungs which may be from mild interstitial edema. Stable splenomegaly. Nonobstructive right nephrolithiasis. Mild ascites. Prominent left hip arthropathy. Stable 5 mm in diameter right middle lobe pulmonary nodule. Electronically Signed   By: Van Clines M.D.   On: 08/10/2018 13:51   Ct Abdomen Pelvis W Contrast  Result Date: 08/10/2018 CLINICAL DATA:  Restaging of breast cancer and ovarian cancer. EXAM: CT CHEST, ABDOMEN, AND PELVIS WITH CONTRAST TECHNIQUE: Multidetector CT imaging of the chest, abdomen and pelvis was performed following the standard protocol during bolus administration of intravenous contrast. CONTRAST:  175m OMNIPAQUE IOHEXOL 300 MG/ML  SOLN COMPARISON:  Multiple exams, including 04/02/2018 PET-CT FINDINGS: CT CHEST FINDINGS Cardiovascular: Left Port-A-Cath tip: Distal SVC. Atherosclerotic calcification of the aortic arch  and coronary arteries with mild cardiomegaly. Distal paraesophageal uphill varices. Mediastinum/Nodes: Scattered mediastinal nodes are present. A prevascular node measures 0.9 cm in short axis on image 17/2, formerly by my measurements the same on the PET-CT from 04/02/2018. Right lower paratracheal node 1.1 cm in short axis on image 18/2, formerly 1.2 cm. Subcarinal node 1.8 cm in short axis on image 24/2, previously the same by my measurements. Upper normal sized bilateral hilar and infrahilar lymph nodes are present. Small left axillary and left subpectoral lymph nodes are observed, slightly reduced in prominence, with a left axillary lymph node on image 11/2 measuring 0.5 cm in short axis, previously 0.7 cm. Lungs/Pleura: Small persistent right  pleural effusion. Peripheral interstitial accentuation favoring the upper lobes. Confluent scarring at the right lung apex. Possible radiation therapy related findings anteriorly in the right middle lobe and right lower lobe. Faint interstitial accentuation at the lung bases, subtle interstitial edema is not excluded. 5 mm right middle lobe pulmonary nodule on image 82/6, stable. This is just below the minor fissure. Musculoskeletal: Right mastectomy. CT ABDOMEN PELVIS FINDINGS Hepatobiliary: Nodular hepatic contour with morphologic findings characteristic of hepatic cirrhosis. Cholecystectomy. No significant degree of biliary dilatation. No significant focal hepatic lesion. Pancreas: Unremarkable Spleen: Splenomegaly, spleen measures 16.8 by 7.9 by 14.6 cm (volume = 1000 cm^3). Adrenals/Urinary Tract: Both adrenal glands appear normal. Borderline urinary bladder wall thickening diffusely. Left mid kidney cyst anteriorly. 5 by 2 mm right mid kidney nonobstructive renal calculus on image 106/4. Stomach/Bowel: Unremarkable Vascular/Lymphatic: Splenorenal shunting. The portal vein appears patent but with some peripheral calcification along its anterior margin. Aortoiliac atherosclerotic vascular disease. Left periaortic lymph node 3.1 cm in short axis on image 70/2, formerly 2.4 cm. Stable reactive porta hepatis lymph nodes. Aortoiliac atherosclerotic vascular disease. Reproductive: Uterus absent.  Adnexa unremarkable. Other: Mild upper abdominal ascites. Subtle infiltration of the omentum and mesentery likely from edema, less likely to be from peritoneal metastatic deposits. Trace pelvic ascites. Stable mild edema in the perirectal space. Musculoskeletal: Prominent left and mild right degenerative hip arthropathy. Spondylosis and degenerative disc disease at L5-S1. IMPRESSION: 1. The dominant left periaortic lymph node has significantly increased in size, currently 3.1 cm in short axis, formerly 2.4 cm, compatible  with malignancy. 2. There is notable hepatic cirrhosis with splenorenal shunting and uphill paraesophageal varices as well as some reactive lymph nodes along the porta hepatis. 3. Mildly prominent mediastinal lymph nodes are roughly stable compared to the prior exam. 4. Other imaging findings of potential clinical significance: Aortic Atherosclerosis (ICD10-I70.0). Coronary atherosclerosis. Small persistent right pleural effusion. Cardiomegaly with interstitial accentuation in the lungs which may be from mild interstitial edema. Stable splenomegaly. Nonobstructive right nephrolithiasis. Mild ascites. Prominent left hip arthropathy. Stable 5 mm in diameter right middle lobe pulmonary nodule. Electronically Signed   By: Van Clines M.D.   On: 08/10/2018 13:51     ASSESSMENT: 63 y.o.  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    (2)  treated in the adjuvant setting with docetaxel/ cyclophosphamide given every 3 weeks. There were multiple and severe complications, and the  patient tolerated only two cycles, last dose 07/29/2012  (3) letrozole started 08/16/2012, discontinued 08/30/2018 with possible progression  (a) osteopenia, with a T score of -1.14 April 2013  (b) bone  density on 08/14/2017 showed a T score of -2.7 osteoporosis  (4) postmastectomy radiation completed 12/24/2012  (5)  comorbidities include diabetes, hypertension, chronic liver disease with cirrhosis and fatty liver, and nutritional disturbance with temporarily dependence on PEG feeds (discontinued July 2014).  (6) restaging studies January 2016 show  (a) 8 mm and 4 mm Right middle lobe lung nodules, not metabolically active on PET   (b) hypermetabolic 00.8 cm mixed solid/cystic lesion in upper pelvis,  with associated adenopathy (see #7)  OVARIAN CANCER (7) status post exploratory laparotomy 06/27/2014 with total abdominal hysterectomy, bilateral salpingo-oophorectomy, para-aortic lymphadenectomy, omentectomy and radical tumor debulking for a left-sided clear cell ovarian cancer, pT1c pN1, stage IIIC .  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 despite being on Coumadin for PAF (subtherapeutic INR) with mobile Right common femoral clot  (a) status post thrombolysis 09/30/2014  (b) on Lovenox daily as of 10/02/2014, stopped April 2017  (c) IVC filter placed 10/26/2014 as mobile clot still noted Right groin  (d) IVC filter removed 01/30/2015 with documented resolution of the movable thrombus 01/16/2015  (e) normal quantitative D-dimer 08/06/2015 and 09/24/2015  (10) started abraxane 10/13/2014, repeated every 14 days for 6 cycles (12 doses), completed 03/26/2015  (11) genetics testing December 04, 2014 through the Breast[facility]Ovarian Cancer Panel [facility] no deleterious mutations in ATM, BARD1, BRCA1, BRCA2, BRIP1, CDH1, CHEK2, FANCC, MLH1, MSH2, MSH6, NBN, PALB2, PMS2, PTEN, RAD51C, RAD51D, STK11, TP53, and XRCC2.    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  She is clinically very stable.  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  They are taking appropriate pandemic precautions  Restaging studies show an enlarging para-aortic lymph node.  This is nonspecific, and may be unrelated to her to known cancers, might represent a third cancer such as a low-grade non-Hodgkin's lymphoma, or it could be inflammatory or infectious  Because her tumor markers have been rising as well as because of the findings on the CT scans were going to change her treatment from letrozole to toremifene and palbociclib.  Her palbociclib dose will be 100 mg instead of 125 mg because we are not going to be able to check her counts closely in the next couple of months given the pandemic.  Namrata is at significant risk and if she becomes infected with the novel coronavirus she has a significant chance of not making it  Accordingly we are going to continue to follow her virtually.  I will contact her again in 2 weeks to make sure she is tolerating her  treatment well  She knows to call for any other issues that may develop before then.   Karem Farha, Virgie Dad, MD  08/20/18 9:47 AM Medical Oncology and Hematology [facility] [address] [place], [place] [address] Tel. [phone]    Fax. [phone]

## 2018-08-24 ENCOUNTER — Telehealth: Payer: Self-pay | Admitting: *Deleted

## 2018-09-01 NOTE — Progress Notes (Addendum)
[name]  [phone]) [phone] [phone]    ID: [name]   DOB: [birth year]  MR#: [id number]  POE#:[id number]  Patient Care Team: Carollee Herter, Alferd Apa, DO as PCP - General (Family Medicine) Starlynn Klinkner, Virgie Dad, MD as Consulting Physician (Oncology) Excell Seltzer, MD as Consulting Physician (General Surgery) Everitt Amber, MD as Consulting Physician (Obstetrics and Gynecology) Gaynelle Arabian, MD as Consulting Physician (Orthopedic Surgery) Armbruster, Carlota Raspberry, MD as Consulting Physician (Gastroenterology) Garald Balding, MD as Consulting Physician (Orthopedic Surgery)  OTHER MD: Daniel Nones, MD; Alger Simons, MD   CHIEF COMPLAINT:  Estrogen receptor positive Breast Cancer; Ovarian cancer; DVT  CURRENT TREATMENT: toremifene, palbociclib   I connected with [name] on 09/02/18 at 11:30 AM EDT by telephone visit and verified that I am speaking with the correct person using two identifiers.   I discussed the limitations, risks, security and privacy concerns of performing an evaluation and management service by telemedicine and the availability of in-person appointments. I also discussed with the patient that there may be a patient responsible charge related to this service. The patient expressed understanding and agreed to proceed.   Other persons participating in the visit and their role in the encounter:   - Biomedical scientist, Medical Scribe   Patients location: home  Providers location: [facility]   Chief Complaint: Estrogen receptor positive Breast Cancer; Ovarian cancer; DVT   INTERVAL HISTORY: [name] was contacted today for follow-up and treatment of her estrogen receptor positive breast cancer.   She continues on palbociclib. She is in week 2. She is more fatigued than before. Her appetite is good and her sense of taste is good. She has has no nausea issues.   She also continues on toremifene. She tolerates this  well and without any noticeable side effects.     [name]'s last bone density screening on 08/14/2017, showed a T-score of -2.7, which is considered osteoporotic.  Since her last visit here, she has not undergone any additional studies.    Results for [name] (MRN [id number]) as of 09/02/2018 11:34  Ref. Range 01/12/2018 09:11 02/23/2018 08:38 03/15/2018 08:58 05/17/2018 09:32 08/10/2018 09:34  CA 27.29 Latest Ref Range: 0.0 - 38.6 U/mL    39.7 (H)   Cancer Antigen (CA) 125 Latest Ref Range: 0.0 - 38.1 U/mL 160.0 (H) 181.0 (H) 137.0 (H) 193.0 (H) 225.0 (H)    REVIEW OF SYSTEMS: The patient denies unusual headaches, visual changes, nausea, vomiting, or dizziness. There has been no unusual cough, phlegm production, or pleurisy. This been no change in bowel or bladder habits. The patient denies unexplained weight loss, bleeding, rash, or fever. A detailed review of systems was otherwise noncontributory.    BREAST CANCER HISTORY: From the original consult note:  [name] noted a mass in her right breast mid December 2013, and as it did not spontaneously resolve over a couple of weeks she brought it to her primary physician's attention. She was set up for diagnostic mammography and right breast ultrasonography at the breast Center 05/10/2012. (Note that the patient's most recent prior mammography had been in October 2008). The current study showed a spiculated mass in the superior subareolar portion of the right breast measuring approximately 5 cm and associated with pleomorphic calcifications. This was firm and palpable. There was right nipple retraction and skin thickening. Ultrasound confirmed an irregularly marginated hypoechoic mass measuring 3.5 cm by ultrasound, and an abnormal appearing lower right axillary lymph node measuring 2.6  cm.  Biopsies of both the breast mass and the abnormal appearing lymph node were performed 05/21/2012. Both showed an invasive ductal carcinoma, grade 2, with similar  prognostic panels (the breast mass was 100% estrogen and 73% progesterone receptor positive, with an MIB-1 of 5%; the lymph node was 100% estrogen 100% progesterone receptor positive, with an MIB-1 of 20%). Both masses were HER-2 negative.  Breast MRI obtained at [facility] imaging 05/29/2012 confirmed a dominant mass in the retroareolar right breast measuring 4.4 cm maximally. There was a satellite nodule inferior and lateral to this mass, measuring 1.7 cm. There were no other masses in either breast. Aside from the previously biopsied lymph node there were other mildly enhancing level I right axillary lymph nodes which did not appear pathologic. There was no other lymphadenopathy noted.   The patient's subsequent history is as detailed below.  OVARIAN CANCER HISTORY: From the 06/16/2014 summary note:  "[name] returns today for review of her restaging studies accompanied by her husband [name]. To summarize her recent history: She was admitted to [facility] with a diagnosis of possible pneumonia in late December 2015. Chest x-ray showed some increased density in the right upper lobe. CT scan was suggested and was obtained by Dr. Inda Castle. This showed 2 small nodules in the right middle lobe, measuring 8 and 4 mm respectively. Dr. Inda Castle then contacted [name] for further evaluation and we set [name] up for a PET scan and which was performed late January. This showed the 2 nodules in the lung not to be hypermetabolic. This of course does not prove that they are not malignant. More importantly, there was a 13.7 cm cystic/solid mass in the upper pelvis associated with adenopathy,. All of this was hypermetabolic."  The patient was evaluated by gynecologic oncology and on 06/27/2014 she underwent surgery under Dr. Denman George. This consisted of an exploratory laparotomy with hysterectomy and abdominal salpingo-oophorectomy, as well as tumor debulking and omentectomy. 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 The tumor measured 11.5 cm, focally involve the ovarian capsule on the left; the uterus and right ovary and fallopian tube were unremarkable. One para-aortic lymph node was positive out of a total of 6 lymph nodes sampled (2 left common iliac, 2 left para-aortic, and to within the omental resection).  Her subsequent history is as detailed below   PAST MEDICAL HISTORY: Past Medical History:  Diagnosis Date   Allergy    Anemia    low iron hx   Arthritis    Breast cancer ([place]) 2014   a. Right - invasive ductal carcinoma with 2/18 lymph nodes involved (pT3, pN1a, stage IIIA), s/p R mastectomy 06/08/12, chemo (not well tolerated->d/c)   Cataract    removed ou   Chronic combined systolic and diastolic CHF (congestive heart failure) ([place])    a. 08/2012 Echo: EF 55-60%, no rwma, mod MR;  b. 07/2014 Echo: EF 45-50%, distal antsept HK, mod TR/MR, mildly bil-atrial enlargement.   Clear Cell Ovarian Cancer    a. 06/2014 s/p TAH/BSO debulking/lymph node dissection;  b. Now on chemo.   Depression    Diabetes mellitus    DJD (degenerative joint disease) of lumbar spine    DVT (deep venous thrombosis) (HCC)    hx of on HRT left leg ~2006   Gallstones    GERD (gastroesophageal reflux disease)    Hyperlipidemia    Hypertension    Hypothyroidism  Liver disease, chronic, with cirrhosis ([place])    ? autoimmune   OSA on CPAP    cpap 4.5 setting   Paroxysmal atrial fibrillation (HCC)    a. CHA2DS2VASc = 4-->coumadin.   Physical deconditioning    PPD positive, treated    rx inh    Sleep apnea     PAST SURGICAL HISTORY: Past Surgical History:  Procedure Laterality Date   ABDOMINAL HYSTERECTOMY N/A 06/27/2014   Procedure: HYSTERECTOMY ABDOMINAL TOTAL;  Surgeon: Everitt Amber, MD;  Location: [facility];  Service: Gynecology;  Laterality: N/A;   BREAST BIOPSY     left breast   BREAST  EXCISIONAL BIOPSY Left 2002   CATARACT EXTRACTION     CHOLECYSTECTOMY     EYE SURGERY     FOOT SURGERY     history of chemotherapy x 2 treatments, radiation tx  2014   LAPAROTOMY N/A 06/27/2014   Procedure: EXPLORATORY LAPAROTOMY;  Surgeon: Everitt Amber, MD;  Location: [facility];  Service: Gynecology;  Laterality: N/A;   MASTECTOMY Right 2014   MASTECTOMY MODIFIED RADICAL  06/08/2012   Procedure: MASTECTOMY MODIFIED RADICAL;  Surgeon: Edward Jolly, MD;  Location: [place];  Service: General;  Laterality: Right;   pac removed  2015   PEG TUBE PLACEMENT     peg removed 2015   PERCUTANEOUS LIVER BIOPSY     PORTACATH PLACEMENT  06/08/2012   Procedure: INSERTION PORT-A-CATH;  Surgeon: Edward Jolly, MD;  Location: [place];  Service: General;  Laterality: Left;   SALPINGOOPHORECTOMY Bilateral 06/27/2014   Procedure: BILATERAL SALPINGO OOPHORECTOMY/TUMOR DEBULKING/LYMPHNODE DISSECTION, OMENTECTOMY;  Surgeon: Everitt Amber, MD;  Location: [facility];  Service: Gynecology;  Laterality: Bilateral;   TUBAL LIGATION      FAMILY HISTORY Family History  Problem Relation Age of Onset   Diabetes Mother    Hypertension Mother    Arthritis Mother    Heart disease Mother    Heart failure Mother    Other Mother        benign breast mass   Stroke Father    Heart disease Father    Dementia Father    Diabetes Paternal Grandmother    Colon cancer Paternal Grandfather        dx. 14s-80s   Colon cancer Maternal Grandfather    Other Daughter        potentially has had negative BRCA testing   Other Daughter        hysterectomy; potentially has had negative genetic testing   Rectal cancer Neg Hx    Esophageal cancer Neg Hx    Liver cancer Neg Hx    The patient's father died in his 63s with a history of dementia. He had had prior strokes. The patient's mother died in her 15s, with a history of congestive heart failure. [name] had no brothers, one sister. There is no history of  breast or ovarian cancer in the family.   GYNECOLOGIC HISTORY: Menarche age 62, first live birth age 48, she is GX P2, menopause approximately 15 years ago, on hormone replacement until 2010.   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 Her husband [name] works for [employer]. Daughter [name] is a Physiological scientist and lives in [place]. Daughter [name] and her family (husband and 2 children aged 9 and 3 years) currently live with the patient. [name] is a member of a [employer].  ADVANCED DIRECTIVES: Not in place   HEALTH MAINTENANCE: (Updated October 2014) Social History   Tobacco Use   Smoking status: Former Smoker    Packs/day: 1.00    Years: 1.00    Pack years: 1.00   Smokeless tobacco: Former Systems developer  Substance Use Topics   Alcohol use: No   Drug use: No    Comment: quit age 29 only smoked as a teen 1 year     Colonoscopy: Never  PAP: Does not recall  Bone density:  08/14/2017 showed a T score of -2.7 osteoporosis  Lipid panel:   Allergies  Allergen Reactions   Olmesartan Medoxomil Cough   Adhesive [Tape] Rash   Tetracycline Hcl Other (See Comments)   Venlafaxine Other (See Comments)    severe dry mouth    Current Outpatient Medications  Medication Sig Dispense Refill   acetaminophen (TYLENOL) 500 MG tablet Take 500 mg by mouth every 6 (six) hours as needed for moderate pain.     ALPRAZolam (XANAX) 0.5 MG tablet Take 1 tablet (0.5 mg total) by mouth 2 (two) times daily as needed for anxiety. 180 tablet 0   Blood Glucose Monitoring Suppl (ONETOUCH VERIO) w/Device KIT Use as directed twice a day.  Dx code: E11.9 1 kit 0   buPROPion (WELLBUTRIN XL) 150 MG 24 hr tablet TAKE 1 TABLET BY MOUTH  DAILY 90 tablet 1   clotrimazole (MYCELEX) 10 MG troche Take 1 tablet (10 mg total) by mouth 3 (three) times daily. 30 tablet 0     diazepam (VALIUM) 5 MG tablet Pt to take 1 tab 1 hour before MRI and may repeat if needed 2 tablet 0   escitalopram (LEXAPRO) 20 MG tablet TAKE 1 TABLET BY MOUTH  EVERY MORNING 90 tablet 1   famotidine (PEPCID) 20 MG tablet Take 1 tablet (20 mg total) by mouth daily. (Patient not taking: Reported on 05/18/2018) 30 tablet 0   furosemide (LASIX) 20 MG tablet TAKE 1 TABLET BY MOUTH  DAILY 90 tablet 0   glimepiride (AMARYL) 2 MG tablet TAKE 1 TABLET BY MOUTH TWO  TIMES DAILY 180 tablet 0   glucose blood (ONETOUCH VERIO) test strip USE TO CHECK BLOOD SUGAR TWICE DAILY 200 each 1   ketoconazole (NIZORAL) 2 % cream Apply 1 application topically daily. 15 g 3   levothyroxine (SYNTHROID, LEVOTHROID) 150 MCG tablet TAKE 1 TABLET BY MOUTH  DAILY 90 tablet 1   lidocaine-prilocaine (EMLA) cream APPLY TOPICALLY OVER PORT  AREA 1-2 HOURS BEFORE  CHEMOTHERAPY AS NEEDED 30 g 1   LORazepam (ATIVAN) 0.5 MG tablet Take 1 tablet (0.5 mg total) by mouth every 8 (eight) hours. Ativan 0.81m - Take 1 tablet prior to MRI imaging - may repeat if needed for anxiety. 30 tablet 0   metFORMIN (GLUCOPHAGE-XR) 500 MG 24 hr tablet TAKE 2 TABLETS BY MOUTH  EVERY EVENING 180 tablet 1   metroNIDAZOLE (METROGEL) 1 % gel Apply topically daily. 45 g 0   nadolol (CORGARD) 40 MG tablet Take 1 tablet (40 mg total) by mouth daily. 90 tablet 3   NONFORMULARY OR COMPOUNDED ITEM Lipid panel   Dx code: E78.5, CMP Dx Code: I10,  A1C Dx code: E11.9 nonfasting is ok 1 each 0   nystatin (MYCOSTATIN) 100000 UNIT/ML suspension Take 10 mLs (1,000,000 Units total) by mouth 4 (four) times daily. 240 mL 0   OLANZapine (ZYPREXA) 2.5 MG tablet TAKE 1 TABLET BY MOUTH  NIGHTLY AT BEDTIME 90 tablet 1   omeprazole (  PRILOSEC) 20 MG capsule Take 1 capsule (20 mg total) by mouth daily. (Patient not taking: Reported on 05/18/2018) 90 capsule 3   ondansetron (ZOFRAN) 8 MG tablet Take 1 tablet (8 mg total) by mouth every 8 (eight) hours as needed for  nausea or vomiting. 270 tablet 0   ONETOUCH DELICA LANCETS 32D MISC USE TO CHECK BLOOD SUGAR TWICE DAILY.  Dx code: E11.9 200 each 1   oxybutynin (DITROPAN) 5 MG tablet Take 1 tablet (5 mg total) by mouth 3 (three) times daily. 270 tablet 03   palbociclib (IBRANCE) 100 MG capsule Take 1 capsule (100 mg total) by mouth daily with breakfast. Take whole with food. Take for 21 days on, 7 days off, repeat every 28 days. 21 capsule 6   potassium chloride (K-DUR,KLOR-CON) 10 MEQ tablet TAKE 1 TABLET BY MOUTH TWO  TIMES DAILY 180 tablet 1   pravastatin (PRAVACHOL) 10 MG tablet TAKE 1 TABLET BY MOUTH  DAILY 90 tablet 1   propranolol (INDERAL) 20 MG tablet TAKE 1 TABLET BY MOUTH TWO  TIMES DAILY 180 tablet 3   Toremifene Citrate (FARESTON) 60 MG tablet Take 1 tablet (60 mg total) by mouth daily. 90 tablet 4   No current facility-administered medications for this visit.     OBJECTIVE: Middle-aged white woman   There were no vitals filed for this visit.   There is no height or weight on file to calculate BMI.    ECOG FS: 1 There were no vitals filed for this visit.  Lab Results  Component Value Date   WBC 4.7 08/10/2018   NEUTROABS 3.6 08/10/2018   HGB 11.7 (L) 08/10/2018   HCT 35.9 (L) 08/10/2018   MCV 101.4 (H) 08/10/2018   PLT 116 (L) 08/10/2018     Chemistry      Component Value Date/Time   NA 140 08/10/2018 0934   NA 140 04/28/2017 0809   K 3.9 08/10/2018 0934   K 3.4 (L) 04/28/2017 0809   CL 108 08/10/2018 0934   CL 104 10/29/2012 1058   CO2 20 (L) 08/10/2018 0934   CO2 22 04/28/2017 0809   BUN 7 (L) 08/10/2018 0934   BUN 6.5 (L) 04/28/2017 0809   CREATININE 0.57 08/10/2018 0934   CREATININE 0.6 04/28/2017 0809      Component Value Date/Time   CALCIUM 8.7 (L) 08/10/2018 0934   CALCIUM 8.4 04/28/2017 0809   ALKPHOS 152 (H) 08/10/2018 0934   ALKPHOS 143 04/28/2017 0809   AST 53 (H) 08/10/2018 0934   AST 53 (H) 04/28/2017 0809   ALT 24 08/10/2018 0934   ALT 21  04/28/2017 0809   BILITOT 2.0 (H) 08/10/2018 0934   BILITOT 2.96 (H) 04/28/2017 0809      STUDIES: Ct Chest W Contrast  Result Date: 08/10/2018 CLINICAL DATA:  Restaging of breast cancer and ovarian cancer. EXAM: CT CHEST, ABDOMEN, AND PELVIS WITH CONTRAST TECHNIQUE: Multidetector CT imaging of the chest, abdomen and pelvis was performed following the standard protocol during bolus administration of intravenous contrast. CONTRAST:  146m OMNIPAQUE IOHEXOL 300 MG/ML  SOLN COMPARISON:  Multiple exams, including 04/02/2018 PET-CT FINDINGS: CT CHEST FINDINGS Cardiovascular: Left Port-A-Cath tip: Distal SVC. Atherosclerotic calcification of the aortic arch and coronary arteries with mild cardiomegaly. Distal paraesophageal uphill varices. Mediastinum/Nodes: Scattered mediastinal nodes are present. A prevascular node measures 0.9 cm in short axis on image 17/2, formerly by my measurements the same on the PET-CT from 04/02/2018. Right lower paratracheal node 1.1 cm in short axis  on image 18/2, formerly 1.2 cm. Subcarinal node 1.8 cm in short axis on image 24/2, previously the same by my measurements. Upper normal sized bilateral hilar and infrahilar lymph nodes are present. Small left axillary and left subpectoral lymph nodes are observed, slightly reduced in prominence, with a left axillary lymph node on image 11/2 measuring 0.5 cm in short axis, previously 0.7 cm. Lungs/Pleura: Small persistent right pleural effusion. Peripheral interstitial accentuation favoring the upper lobes. Confluent scarring at the right lung apex. Possible radiation therapy related findings anteriorly in the right middle lobe and right lower lobe. Faint interstitial accentuation at the lung bases, subtle interstitial edema is not excluded. 5 mm right middle lobe pulmonary nodule on image 82/6, stable. This is just below the minor fissure. Musculoskeletal: Right mastectomy. CT ABDOMEN PELVIS FINDINGS Hepatobiliary: Nodular hepatic contour  with morphologic findings characteristic of hepatic cirrhosis. Cholecystectomy. No significant degree of biliary dilatation. No significant focal hepatic lesion. Pancreas: Unremarkable Spleen: Splenomegaly, spleen measures 16.8 by 7.9 by 14.6 cm (volume = 1000 cm^3). Adrenals/Urinary Tract: Both adrenal glands appear normal. Borderline urinary bladder wall thickening diffusely. Left mid kidney cyst anteriorly. 5 by 2 mm right mid kidney nonobstructive renal calculus on image 106/4. Stomach/Bowel: Unremarkable Vascular/Lymphatic: Splenorenal shunting. The portal vein appears patent but with some peripheral calcification along its anterior margin. Aortoiliac atherosclerotic vascular disease. Left periaortic lymph node 3.1 cm in short axis on image 70/2, formerly 2.4 cm. Stable reactive porta hepatis lymph nodes. Aortoiliac atherosclerotic vascular disease. Reproductive: Uterus absent.  Adnexa unremarkable. Other: Mild upper abdominal ascites. Subtle infiltration of the omentum and mesentery likely from edema, less likely to be from peritoneal metastatic deposits. Trace pelvic ascites. Stable mild edema in the perirectal space. Musculoskeletal: Prominent left and mild right degenerative hip arthropathy. Spondylosis and degenerative disc disease at L5-S1. IMPRESSION: 1. The dominant left periaortic lymph node has significantly increased in size, currently 3.1 cm in short axis, formerly 2.4 cm, compatible with malignancy. 2. There is notable hepatic cirrhosis with splenorenal shunting and uphill paraesophageal varices as well as some reactive lymph nodes along the porta hepatis. 3. Mildly prominent mediastinal lymph nodes are roughly stable compared to the prior exam. 4. Other imaging findings of potential clinical significance: Aortic Atherosclerosis (ICD10-I70.0). Coronary atherosclerosis. Small persistent right pleural effusion. Cardiomegaly with interstitial accentuation in the lungs which may be from mild  interstitial edema. Stable splenomegaly. Nonobstructive right nephrolithiasis. Mild ascites. Prominent left hip arthropathy. Stable 5 mm in diameter right middle lobe pulmonary nodule. Electronically Signed   By: Van Clines M.D.   On: 08/10/2018 13:51   Ct Abdomen Pelvis W Contrast  Result Date: 08/10/2018 CLINICAL DATA:  Restaging of breast cancer and ovarian cancer. EXAM: CT CHEST, ABDOMEN, AND PELVIS WITH CONTRAST TECHNIQUE: Multidetector CT imaging of the chest, abdomen and pelvis was performed following the standard protocol during bolus administration of intravenous contrast. CONTRAST:  126m OMNIPAQUE IOHEXOL 300 MG/ML  SOLN COMPARISON:  Multiple exams, including 04/02/2018 PET-CT FINDINGS: CT CHEST FINDINGS Cardiovascular: Left Port-A-Cath tip: Distal SVC. Atherosclerotic calcification of the aortic arch and coronary arteries with mild cardiomegaly. Distal paraesophageal uphill varices. Mediastinum/Nodes: Scattered mediastinal nodes are present. A prevascular node measures 0.9 cm in short axis on image 17/2, formerly by my measurements the same on the PET-CT from 04/02/2018. Right lower paratracheal node 1.1 cm in short axis on image 18/2, formerly 1.2 cm. Subcarinal node 1.8 cm in short axis on image 24/2, previously the same by my measurements. Upper normal sized bilateral  hilar and infrahilar lymph nodes are present. Small left axillary and left subpectoral lymph nodes are observed, slightly reduced in prominence, with a left axillary lymph node on image 11/2 measuring 0.5 cm in short axis, previously 0.7 cm. Lungs/Pleura: Small persistent right pleural effusion. Peripheral interstitial accentuation favoring the upper lobes. Confluent scarring at the right lung apex. Possible radiation therapy related findings anteriorly in the right middle lobe and right lower lobe. Faint interstitial accentuation at the lung bases, subtle interstitial edema is not excluded. 5 mm right middle lobe pulmonary  nodule on image 82/6, stable. This is just below the minor fissure. Musculoskeletal: Right mastectomy. CT ABDOMEN PELVIS FINDINGS Hepatobiliary: Nodular hepatic contour with morphologic findings characteristic of hepatic cirrhosis. Cholecystectomy. No significant degree of biliary dilatation. No significant focal hepatic lesion. Pancreas: Unremarkable Spleen: Splenomegaly, spleen measures 16.8 by 7.9 by 14.6 cm (volume = 1000 cm^3). Adrenals/Urinary Tract: Both adrenal glands appear normal. Borderline urinary bladder wall thickening diffusely. Left mid kidney cyst anteriorly. 5 by 2 mm right mid kidney nonobstructive renal calculus on image 106/4. Stomach/Bowel: Unremarkable Vascular/Lymphatic: Splenorenal shunting. The portal vein appears patent but with some peripheral calcification along its anterior margin. Aortoiliac atherosclerotic vascular disease. Left periaortic lymph node 3.1 cm in short axis on image 70/2, formerly 2.4 cm. Stable reactive porta hepatis lymph nodes. Aortoiliac atherosclerotic vascular disease. Reproductive: Uterus absent.  Adnexa unremarkable. Other: Mild upper abdominal ascites. Subtle infiltration of the omentum and mesentery likely from edema, less likely to be from peritoneal metastatic deposits. Trace pelvic ascites. Stable mild edema in the perirectal space. Musculoskeletal: Prominent left and mild right degenerative hip arthropathy. Spondylosis and degenerative disc disease at L5-S1. IMPRESSION: 1. The dominant left periaortic lymph node has significantly increased in size, currently 3.1 cm in short axis, formerly 2.4 cm, compatible with malignancy. 2. There is notable hepatic cirrhosis with splenorenal shunting and uphill paraesophageal varices as well as some reactive lymph nodes along the porta hepatis. 3. Mildly prominent mediastinal lymph nodes are roughly stable compared to the prior exam. 4. Other imaging findings of potential clinical significance: Aortic Atherosclerosis  (ICD10-I70.0). Coronary atherosclerosis. Small persistent right pleural effusion. Cardiomegaly with interstitial accentuation in the lungs which may be from mild interstitial edema. Stable splenomegaly. Nonobstructive right nephrolithiasis. Mild ascites. Prominent left hip arthropathy. Stable 5 mm in diameter right middle lobe pulmonary nodule. Electronically Signed   By: Van Clines M.D.   On: 08/10/2018 13:51     ASSESSMENT: 63 y.o.  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    (2)  treated in the adjuvant setting with docetaxel/ cyclophosphamide given every 3 weeks. There were multiple and severe complications, and the  patient tolerated only two cycles, last dose 07/29/2012  (3) letrozole started 08/16/2012  (a) osteopenia, with a T score of -1.14 April 2013  (b) bone density on 08/14/2017 showed a T score of -2.7 osteoporosis  (c) discontinued with rising tumor markers and 1 enlarged left para-aortic lymph node  (4) postmastectomy radiation completed 12/24/2012  (5)  comorbidities include diabetes, hypertension, chronic liver disease with cirrhosis and fatty liver, and nutritional disturbance with temporarily dependence on PEG feeds (discontinued July 2014).  (6) restaging studies January 2016 show  (a) 8 mm and 4 mm Right middle lobe lung nodules, not metabolically active  on PET   (b) hypermetabolic 22.5 cm mixed solid/cystic lesion in upper pelvis, with associated adenopathy (see #7)  OVARIAN CANCER (7) status post exploratory laparotomy 06/27/2014 with total abdominal hysterectomy, bilateral salpingo-oophorectomy, para-aortic lymphadenectomy, omentectomy and radical tumor debulking for a left-sided clear cell ovarian cancer, pT1c pN1, stage IIIC .  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 despite being on Coumadin for PAF (subtherapeutic INR) with mobile Right common femoral clot  (a) status post thrombolysis 09/30/2014  (b) on Lovenox daily as of 10/02/2014, stopped April 2017  (c) IVC filter placed 10/26/2014 as mobile clot still noted Right groin  (d) IVC filter removed 01/30/2015 with documented resolution of the movable thrombus 01/16/2015  (e) normal quantitative D-dimer 08/06/2015 and 09/24/2015  (10) started abraxane 10/13/2014, repeated every 14 days for 6 cycles (12 doses), completed 03/26/2015  (11) genetics testing December 04, 2014 through the Breast[facility]Ovarian Cancer Panel [facility] no deleterious mutations in ATM, BARD1, BRCA1, BRCA2, BRIP1, CDH1, CHEK2, FANCC, MLH1, MSH2, MSH6, NBN, PALB2, PMS2, PTEN, RAD51C, RAD51D, STK11, TP53, and XRCC2.    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  (a) spleen measured 14.3 cm on CT scan of the abdomen 10/17/2016  ONGOING MONITORING: (14) pet SCAN 04/02/2018 shows retroperitoneal adenopathy, hypermetabolic and small left axillary and mediastinal lymph nodes  (a) Ca 125 is informative  (b) MRI of orbits 07/03/2018 no masses or findings of concern  (c) CT scans C/A/P on 08/10/2018 shows an enlarging left periaortic node, other nodes stable; also significant cirrhosis/ splenomegaly  (15) toremifene started 08/18/2018  (a) palbociclib added  04/13/22020 at 100 mg/ day 21/7   PLAN: Soriyah is a little over 6 years out from definitive surgery for her breast cancer and 4 years out from surgery for her ovarian cancer, with no symptoms related to either disease.  This is very favorable.  She does have a rising Ca1 25 and she has some very slowly growing adenopathy in fact really only one lymph node that grew.  Some of these readings may be being confounded by the fact that she has significant cirrhosis secondary to steatosis.  In any case given the rise in the Ca1 25 and the enlargement of 1 periaortic lymph node we have switched her treatments to toremifene and palbociclib.  She is doing well with these medications.  She seems to have mild fatigue from the palbociclib.  Unfortunately we are not able to check her counts right now because of the pandemic I have started her on a second level on the palbociclib so I do not anticipate significant cytopenias.  She is in the middle of her second week.  She should be finishing around May 5 and she should be restarting the next cycle 09/21/2018.  I have asked them to call us if they do not get the next shipment within a couple of days of completing the current cycle  Otherwise I Rhonda check in on them again in 4 weeks and hopefully 8 weeks from now she Rhonda be able to  come here in person  They know to call for any other issues that may develop before then.   Caryl Manas, Virgie Dad, MD  09/02/18 11:42 AM Medical Oncology and Hematology [facility] [address] [place], [place] [address] Tel. [phone]    Fax. [phone]  I, Jacqualyn Posey am acting as a Education administrator for Chauncey Cruel, MD.   I, Lurline Del MD, have reviewed the above documentation for accuracy and completeness, and I agree with the above.  I spent approximately 30 minutes face to face with Lauraann with more than 50% of that time spent in counseling and coordination of care.

## 2018-09-02 ENCOUNTER — Inpatient Hospital Stay (HOSPITAL_BASED_OUTPATIENT_CLINIC_OR_DEPARTMENT_OTHER): Payer: Medicare Other | Admitting: Oncology

## 2018-09-02 ENCOUNTER — Other Ambulatory Visit: Payer: Self-pay | Admitting: Oncology

## 2018-09-02 DIAGNOSIS — C50811 Malignant neoplasm of overlapping sites of right female breast: Secondary | ICD-10-CM

## 2018-09-02 DIAGNOSIS — K760 Fatty (change of) liver, not elsewhere classified: Secondary | ICD-10-CM

## 2018-09-02 DIAGNOSIS — D696 Thrombocytopenia, unspecified: Secondary | ICD-10-CM

## 2018-09-02 DIAGNOSIS — C569 Malignant neoplasm of unspecified ovary: Secondary | ICD-10-CM | POA: Diagnosis not present

## 2018-09-02 DIAGNOSIS — C772 Secondary and unspecified malignant neoplasm of intra-abdominal lymph nodes: Secondary | ICD-10-CM

## 2018-09-02 DIAGNOSIS — C801 Malignant (primary) neoplasm, unspecified: Secondary | ICD-10-CM

## 2018-09-02 DIAGNOSIS — Z170 Estrogen receptor positive status [ER+]: Secondary | ICD-10-CM

## 2018-09-02 DIAGNOSIS — G63 Polyneuropathy in diseases classified elsewhere: Secondary | ICD-10-CM

## 2018-09-02 DIAGNOSIS — I2510 Atherosclerotic heart disease of native coronary artery without angina pectoris: Secondary | ICD-10-CM | POA: Insufficient documentation

## 2018-09-02 DIAGNOSIS — K744 Secondary biliary cirrhosis: Secondary | ICD-10-CM

## 2018-09-02 DIAGNOSIS — C50019 Malignant neoplasm of nipple and areola, unspecified female breast: Secondary | ICD-10-CM

## 2018-09-02 NOTE — Progress Notes (Unsigned)
[name]  [phone] [phone]    ID: [name]   DOB: [birth year]  MR#: [id number]  GXQ#:[id number]  Patient Care Team: [name], Rhonda Apa, DO as PCP - General (Family Medicine) [name], Rhonda Dad, MD as Consulting Physician (Oncology) Rhonda Seltzer, MD as Consulting Physician (General Surgery) Rhonda Amber, MD as Consulting Physician (Obstetrics and Gynecology) Rhonda Arabian, MD as Consulting Physician (Orthopedic Surgery) [name], Rhonda Raspberry, MD as Consulting Physician (Gastroenterology) Rhonda Balding, MD as Consulting Physician (Orthopedic Surgery)  OTHER MD: Rhonda Nones, MD; Rhonda Simons, MD   CHIEF COMPLAINT:  Estrogen receptor positive Breast Cancer; Ovarian cancer; DVT  CURRENT TREATMENT: toremifene, palbociclib   I connected with [name] on 09/02/18 at  by telephone visit and verified that I am speaking with the correct person using two identifiers.   I discussed the limitations, risks, security and privacy concerns of performing an evaluation and management service by telemedicine and the availability of in-person appointments. I also discussed with the patient that there may be a patient responsible charge related to this service. The patient expressed understanding and agreed to proceed.   Other persons participating in the visit and their role in the encounter:   - Biomedical scientist, Medical Scribe   Patients location: home  Providers location: [name]   Chief Complaint: Estrogen receptor positive Breast Cancer; Ovarian cancer; DVT   INTERVAL HISTORY: [name] was contacted today for follow-up and treatment of her estrogen receptor positive breast cancer.   She continues on palbociclib. She is in week 2. She is more fatigued than before. Her appetite is good and her sense of taste is good. She has has no nausea issues.   She also continues on toremifene. She tolerates this well and  without any noticeable side effects.     [name] last bone density screening on 08/14/2017, showed a T-score of -2.7, which is considered osteoporotic.  Since her last visit here, she has not undergone any additional studies.    Results for Rhonda, BADALAMENTI (MRN [id number]) as of 09/02/2018 11:34  Ref. Range 01/12/2018 09:11 02/23/2018 08:38 03/15/2018 08:58 05/17/2018 09:32 08/10/2018 09:34  CA 27.29 Latest Ref Range: 0.0 - 38.6 U/mL    39.7 (H)   Cancer Antigen (CA) 125 Latest Ref Range: 0.0 - 38.1 U/mL 160.0 (H) 181.0 (H) 137.0 (H) 193.0 (H) 225.0 (H)    REVIEW OF SYSTEMS: The patient denies unusual headaches, visual changes, nausea, vomiting, or dizziness. There has been no unusual cough, phlegm production, or pleurisy. This been no change in bowel or bladder habits. The patient denies unexplained weight loss, bleeding, rash, or fever. A detailed review of systems was otherwise noncontributory.    BREAST CANCER HISTORY: From the original consult note:  [name] noted a mass in her right breast mid December 2013, and as it did not spontaneously resolve over a couple of weeks she brought it to her primary physician's attention. She was set up for diagnostic mammography and right breast ultrasonography at the breast Center 05/10/2012. (Note that the patient's most recent prior mammography had been in October 2008). The current study showed a spiculated mass in the superior subareolar portion of the right breast measuring approximately 5 cm and associated with pleomorphic calcifications. This was firm and palpable. There was right nipple retraction and skin thickening. Ultrasound confirmed an irregularly marginated hypoechoic mass measuring 3.5 cm by ultrasound, and an abnormal appearing lower right axillary lymph node measuring 2.6 cm.  Biopsies of both the breast mass and the abnormal appearing lymph node were performed 05/21/2012. Both showed an invasive ductal carcinoma, grade 2, with similar prognostic  panels (the breast mass was 100% estrogen and 73% progesterone receptor positive, with an MIB-1 of 5%; the lymph node was 100% estrogen 100% progesterone receptor positive, with an MIB-1 of 20%). Both masses were HER-2 negative.  Breast MRI obtained at [name] 05/29/2012 confirmed a dominant mass in the retroareolar right breast measuring 4.4 cm maximally. There was a satellite nodule inferior and lateral to this mass, measuring 1.7 cm. There were no other masses in either breast. Aside from the previously biopsied lymph node there were other mildly enhancing level I right axillary lymph nodes which did not appear pathologic. There was no other lymphadenopathy noted.   The patient's subsequent history is as detailed below.  OVARIAN CANCER HISTORY: From the 06/16/2014 summary note:  "[name] returns today for review of her restaging studies accompanied by her husband [name]. To summarize her recent history: She was admitted to [facility] with a diagnosis of possible pneumonia in late December 2015. Chest x-ray showed some increased density in the right upper lobe. CT scan was suggested and was obtained by Dr. Inda [name]. This showed 2 small nodules in the right middle lobe, measuring 8 and 4 mm respectively. Dr. Inda [name] then contacted [name] for further evaluation and we set [name] up for a PET scan and which was performed late January. This showed the 2 nodules in the lung not to be hypermetabolic. This of course does not prove that they are not malignant. More importantly, there was a 13.7 cm cystic/solid mass in the upper pelvis associated with adenopathy,. All of this was hypermetabolic."  The patient was evaluated by gynecologic oncology and on 06/27/2014 she underwent surgery under Dr. Denman [name]. This consisted of an exploratory laparotomy with hysterectomy and abdominal salpingo-oophorectomy, as well as tumor debulking and omentectomy. 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 The tumor measured 11.5 cm, focally involve the ovarian capsule on the left; the uterus and right ovary and fallopian tube were unremarkable. One para-aortic lymph node was positive out of a total of 6 lymph nodes sampled (2 left common iliac, 2 left para-aortic, and to within the omental resection).  Her subsequent history is as detailed below   PAST MEDICAL HISTORY: Past Medical History:  Diagnosis Date   Allergy    Anemia    low iron hx   Arthritis    Breast cancer ([place]) 2014   a. Right - invasive ductal carcinoma with 2/18 lymph nodes involved (pT3, pN1a, stage IIIA), s/p R mastectomy 06/08/12, chemo (not well tolerated->d/c)   Cataract    removed ou   Chronic combined systolic and diastolic CHF (congestive heart failure) ([place])    a. 08/2012 Echo: EF 55-60%, no rwma, mod MR;  b. 07/2014 Echo: EF 45-50%, distal antsept HK, mod TR/MR, mildly bil-atrial enlargement.   Clear Cell Ovarian Cancer    a. 06/2014 s/p TAH/BSO debulking/lymph node dissection;  b. Now on chemo.   Depression    Diabetes mellitus    DJD (degenerative joint disease) of lumbar spine    DVT (deep venous thrombosis) (HCC)    hx of on HRT left leg ~2006   Gallstones    GERD (gastroesophageal reflux disease)    Hyperlipidemia    Hypertension    Hypothyroidism  Liver disease, chronic, with cirrhosis ([name])    ? autoimmune   OSA on CPAP    cpap 4.5 setting   Paroxysmal atrial fibrillation (HCC)    a. CHA2DS2VASc = 4-->coumadin.   Physical deconditioning    PPD positive, treated    rx inh    Sleep apnea     PAST SURGICAL HISTORY: Past Surgical History:  Procedure Laterality Date   ABDOMINAL HYSTERECTOMY N/A 06/27/2014   Procedure: HYSTERECTOMY ABDOMINAL TOTAL;  Surgeon: Rhonda Amber, MD;  Location: [facility];  Service: Gynecology;  Laterality: N/A;   BREAST BIOPSY     left breast   BREAST EXCISIONAL  BIOPSY Left 2002   CATARACT EXTRACTION     CHOLECYSTECTOMY     EYE SURGERY     FOOT SURGERY     history of chemotherapy x 2 treatments, radiation tx  2014   LAPAROTOMY N/A 06/27/2014   Procedure: EXPLORATORY LAPAROTOMY;  Surgeon: Rhonda Amber, MD;  Location: [facility];  Service: Gynecology;  Laterality: N/A;   MASTECTOMY Right 2014   MASTECTOMY MODIFIED RADICAL  06/08/2012   Procedure: MASTECTOMY MODIFIED RADICAL;  Surgeon: Edward Jolly, MD;  Location: [place];  Service: General;  Laterality: Right;   pac removed  2015   PEG TUBE PLACEMENT     peg removed 2015   PERCUTANEOUS LIVER BIOPSY     PORTACATH PLACEMENT  06/08/2012   Procedure: INSERTION PORT-A-CATH;  Surgeon: Edward Jolly, MD;  Location: [place];  Service: General;  Laterality: Left;   SALPINGOOPHORECTOMY Bilateral 06/27/2014   Procedure: BILATERAL SALPINGO OOPHORECTOMY/TUMOR DEBULKING/LYMPHNODE DISSECTION, OMENTECTOMY;  Surgeon: Rhonda Amber, MD;  Location: [facility];  Service: Gynecology;  Laterality: Bilateral;   TUBAL LIGATION      FAMILY HISTORY Family History  Problem Relation Age of Onset   Diabetes Mother    Hypertension Mother    Arthritis Mother    Heart disease Mother    Heart failure Mother    Other Mother        benign breast mass   Stroke Father    Heart disease Father    Dementia Father    Diabetes Paternal Grandmother    Colon cancer Paternal Grandfather        dx. 42s-80s   Colon cancer Maternal Grandfather    Other Daughter        potentially has had negative BRCA testing   Other Daughter        hysterectomy; potentially has had negative genetic testing   Rectal cancer Neg Hx    Esophageal cancer Neg Hx    Liver cancer Neg Hx    The patient's father died in his 11s with a history of dementia. He had had prior strokes. The patient's mother died in her 38s, with a history of congestive heart failure. [name] had no brothers, one sister. There is no history of breast or  ovarian cancer in the family.   GYNECOLOGIC HISTORY: Menarche age 67, first live birth age 34, she is GX P2, menopause approximately 15 years ago, on hormone replacement until 2010.   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 Her husband [name] works for [employer]. Daughter [name] is a Physiological scientist and lives in [place]. Daughter [name] and her family (husband and 2 children aged 66 and 3 years) currently live with the patient. [name] is a member of a [employer].  ADVANCED DIRECTIVES: Not in place   HEALTH MAINTENANCE: (Updated October 2014) Social History   Tobacco Use   Smoking status: Former Smoker    Packs/day: 1.00    Years: 1.00    Pack years: 1.00   Smokeless tobacco: Former Systems developer  Substance Use Topics   Alcohol use: No   Drug use: No    Comment: quit age 4 only smoked as a teen 1 year     Colonoscopy: Never  PAP: Does not recall  Bone density:  08/14/2017 showed a T score of -2.7 osteoporosis  Lipid panel:   Allergies  Allergen Reactions   Olmesartan Medoxomil Cough   Adhesive [Tape] Rash   Tetracycline Hcl Other (See Comments)   Venlafaxine Other (See Comments)    severe dry mouth    Current Outpatient Medications  Medication Sig Dispense Refill   acetaminophen (TYLENOL) 500 MG tablet Take 500 mg by mouth every 6 (six) hours as needed for moderate pain.     ALPRAZolam (XANAX) 0.5 MG tablet Take 1 tablet (0.5 mg total) by mouth 2 (two) times daily as needed for anxiety. 180 tablet 0   Blood Glucose Monitoring Suppl (ONETOUCH VERIO) w/Device KIT Use as directed twice a day.  Dx code: E11.9 1 kit 0   buPROPion (WELLBUTRIN XL) 150 MG 24 hr tablet TAKE 1 TABLET BY MOUTH  DAILY 90 tablet 1   clotrimazole (MYCELEX) 10 MG troche Take 1 tablet (10 mg total) by mouth 3 (three) times daily. 30 tablet 0    diazepam (VALIUM) 5 MG tablet Pt to take 1 tab 1 hour before MRI and may repeat if needed 2 tablet 0   escitalopram (LEXAPRO) 20 MG tablet TAKE 1 TABLET BY MOUTH  EVERY MORNING 90 tablet 1   famotidine (PEPCID) 20 MG tablet Take 1 tablet (20 mg total) by mouth daily. (Patient not taking: Reported on 05/18/2018) 30 tablet 0   furosemide (LASIX) 20 MG tablet TAKE 1 TABLET BY MOUTH  DAILY 90 tablet 0   glimepiride (AMARYL) 2 MG tablet TAKE 1 TABLET BY MOUTH TWO  TIMES DAILY 180 tablet 0   glucose blood (ONETOUCH VERIO) test strip USE TO CHECK BLOOD SUGAR TWICE DAILY 200 each 1   ketoconazole (NIZORAL) 2 % cream Apply 1 application topically daily. 15 g 3   levothyroxine (SYNTHROID, LEVOTHROID) 150 MCG tablet TAKE 1 TABLET BY MOUTH  DAILY 90 tablet 1   lidocaine-prilocaine (EMLA) cream APPLY TOPICALLY OVER PORT  AREA 1-2 HOURS BEFORE  CHEMOTHERAPY AS NEEDED 30 g 1   LORazepam (ATIVAN) 0.5 MG tablet Take 1 tablet (0.5 mg total) by mouth every 8 (eight) hours. Ativan 0.20m - Take 1 tablet prior to MRI imaging - may repeat if needed for anxiety. 30 tablet 0   metFORMIN (GLUCOPHAGE-XR) 500 MG 24 hr tablet TAKE 2 TABLETS BY MOUTH  EVERY EVENING 180 tablet 1   metroNIDAZOLE (METROGEL) 1 % gel Apply topically daily. 45 g 0   nadolol (CORGARD) 40 MG tablet Take 1 tablet (40 mg total) by mouth daily. 90 tablet 3   NONFORMULARY OR COMPOUNDED ITEM Lipid panel   Dx code: E78.5, CMP Dx Code: I10,  A1C Dx code: E11.9 nonfasting is ok 1 each 0   nystatin (MYCOSTATIN) 100000 UNIT/ML suspension Take 10 mLs (1,000,000 Units total) by mouth 4 (four) times daily. 240 mL 0   OLANZapine (ZYPREXA) 2.5 MG tablet TAKE 1 TABLET BY MOUTH  NIGHTLY AT BEDTIME 90 tablet 1   omeprazole (PRILOSEC)  20 MG capsule Take 1 capsule (20 mg total) by mouth daily. (Patient not taking: Reported on 05/18/2018) 90 capsule 3   ondansetron (ZOFRAN) 8 MG tablet Take 1 tablet (8 mg total) by mouth every 8 (eight) hours as needed for  nausea or vomiting. 270 tablet 0   ONETOUCH DELICA LANCETS 35K MISC USE TO CHECK BLOOD SUGAR TWICE DAILY.  Dx code: E11.9 200 each 1   oxybutynin (DITROPAN) 5 MG tablet Take 1 tablet (5 mg total) by mouth 3 (three) times daily. 270 tablet 03   palbociclib (IBRANCE) 100 MG capsule Take 1 capsule (100 mg total) by mouth daily with breakfast. Take whole with food. Take for 21 days on, 7 days off, repeat every 28 days. 21 capsule 6   potassium chloride (K-DUR,KLOR-CON) 10 MEQ tablet TAKE 1 TABLET BY MOUTH TWO  TIMES DAILY 180 tablet 1   pravastatin (PRAVACHOL) 10 MG tablet TAKE 1 TABLET BY MOUTH  DAILY 90 tablet 1   propranolol (INDERAL) 20 MG tablet TAKE 1 TABLET BY MOUTH TWO  TIMES DAILY 180 tablet 3   Toremifene Citrate (FARESTON) 60 MG tablet Take 1 tablet (60 mg total) by mouth daily. 90 tablet 4   No current facility-administered medications for this visit.     OBJECTIVE: Middle-aged white woman   There were no vitals filed for this visit.   There is no height or weight on file to calculate BMI.    ECOG FS: 1 There were no vitals filed for this visit.  Lab Results  Component Value Date   WBC 4.7 08/10/2018   NEUTROABS 3.6 08/10/2018   HGB 11.7 (L) 08/10/2018   HCT 35.9 (L) 08/10/2018   MCV 101.4 (H) 08/10/2018   PLT 116 (L) 08/10/2018     Chemistry      Component Value Date/Time   NA 140 08/10/2018 0934   NA 140 04/28/2017 0809   K 3.9 08/10/2018 0934   K 3.4 (L) 04/28/2017 0809   CL 108 08/10/2018 0934   CL 104 10/29/2012 1058   CO2 20 (L) 08/10/2018 0934   CO2 22 04/28/2017 0809   BUN 7 (L) 08/10/2018 0934   BUN 6.5 (L) 04/28/2017 0809   CREATININE 0.57 08/10/2018 0934   CREATININE 0.6 04/28/2017 0809      Component Value Date/Time   CALCIUM 8.7 (L) 08/10/2018 0934   CALCIUM 8.4 04/28/2017 0809   ALKPHOS 152 (H) 08/10/2018 0934   ALKPHOS 143 04/28/2017 0809   AST 53 (H) 08/10/2018 0934   AST 53 (H) 04/28/2017 0809   ALT 24 08/10/2018 0934   ALT 21  04/28/2017 0809   BILITOT 2.0 (H) 08/10/2018 0934   BILITOT 2.96 (H) 04/28/2017 0809      STUDIES: Ct Chest W Contrast  Result Date: 08/10/2018 CLINICAL DATA:  Restaging of breast cancer and ovarian cancer. EXAM: CT CHEST, ABDOMEN, AND PELVIS WITH CONTRAST TECHNIQUE: Multidetector CT imaging of the chest, abdomen and pelvis was performed following the standard protocol during bolus administration of intravenous contrast. CONTRAST:  166m OMNIPAQUE IOHEXOL 300 MG/ML  SOLN COMPARISON:  Multiple exams, including 04/02/2018 PET-CT FINDINGS: CT CHEST FINDINGS Cardiovascular: Left Port-A-Cath tip: Distal SVC. Atherosclerotic calcification of the aortic arch and coronary arteries with mild cardiomegaly. Distal paraesophageal uphill varices. Mediastinum/Nodes: Scattered mediastinal nodes are present. A prevascular node measures 0.9 cm in short axis on image 17/2, formerly by my measurements the same on the PET-CT from 04/02/2018. Right lower paratracheal node 1.1 cm in short axis on  image 18/2, formerly 1.2 cm. Subcarinal node 1.8 cm in short axis on image 24/2, previously the same by my measurements. Upper normal sized bilateral hilar and infrahilar lymph nodes are present. Small left axillary and left subpectoral lymph nodes are observed, slightly reduced in prominence, with a left axillary lymph node on image 11/2 measuring 0.5 cm in short axis, previously 0.7 cm. Lungs/Pleura: Small persistent right pleural effusion. Peripheral interstitial accentuation favoring the upper lobes. Confluent scarring at the right lung apex. Possible radiation therapy related findings anteriorly in the right middle lobe and right lower lobe. Faint interstitial accentuation at the lung bases, subtle interstitial edema is not excluded. 5 mm right middle lobe pulmonary nodule on image 82/6, stable. This is just below the minor fissure. Musculoskeletal: Right mastectomy. CT ABDOMEN PELVIS FINDINGS Hepatobiliary: Nodular hepatic contour  with morphologic findings characteristic of hepatic cirrhosis. Cholecystectomy. No significant degree of biliary dilatation. No significant focal hepatic lesion. Pancreas: Unremarkable Spleen: Splenomegaly, spleen measures 16.8 by 7.9 by 14.6 cm (volume = 1000 cm^3). Adrenals/Urinary Tract: Both adrenal glands appear normal. Borderline urinary bladder wall thickening diffusely. Left mid kidney cyst anteriorly. 5 by 2 mm right mid kidney nonobstructive renal calculus on image 106/4. Stomach/Bowel: Unremarkable Vascular/Lymphatic: Splenorenal shunting. The portal vein appears patent but with some peripheral calcification along its anterior margin. Aortoiliac atherosclerotic vascular disease. Left periaortic lymph node 3.1 cm in short axis on image 70/2, formerly 2.4 cm. Stable reactive porta hepatis lymph nodes. Aortoiliac atherosclerotic vascular disease. Reproductive: Uterus absent.  Adnexa unremarkable. Other: Mild upper abdominal ascites. Subtle infiltration of the omentum and mesentery likely from edema, less likely to be from peritoneal metastatic deposits. Trace pelvic ascites. Stable mild edema in the perirectal space. Musculoskeletal: Prominent left and mild right degenerative hip arthropathy. Spondylosis and degenerative disc disease at L5-S1. IMPRESSION: 1. The dominant left periaortic lymph node has significantly increased in size, currently 3.1 cm in short axis, formerly 2.4 cm, compatible with malignancy. 2. There is notable hepatic cirrhosis with splenorenal shunting and uphill paraesophageal varices as well as some reactive lymph nodes along the porta hepatis. 3. Mildly prominent mediastinal lymph nodes are roughly stable compared to the prior exam. 4. Other imaging findings of potential clinical significance: Aortic Atherosclerosis (ICD10-I70.0). Coronary atherosclerosis. Small persistent right pleural effusion. Cardiomegaly with interstitial accentuation in the lungs which may be from mild  interstitial edema. Stable splenomegaly. Nonobstructive right nephrolithiasis. Mild ascites. Prominent left hip arthropathy. Stable 5 mm in diameter right middle lobe pulmonary nodule. Electronically Signed   By: Van Clines M.D.   On: 08/10/2018 13:51   Ct Abdomen Pelvis W Contrast  Result Date: 08/10/2018 CLINICAL DATA:  Restaging of breast cancer and ovarian cancer. EXAM: CT CHEST, ABDOMEN, AND PELVIS WITH CONTRAST TECHNIQUE: Multidetector CT imaging of the chest, abdomen and pelvis was performed following the standard protocol during bolus administration of intravenous contrast. CONTRAST:  179m OMNIPAQUE IOHEXOL 300 MG/ML  SOLN COMPARISON:  Multiple exams, including 04/02/2018 PET-CT FINDINGS: CT CHEST FINDINGS Cardiovascular: Left Port-A-Cath tip: Distal SVC. Atherosclerotic calcification of the aortic arch and coronary arteries with mild cardiomegaly. Distal paraesophageal uphill varices. Mediastinum/Nodes: Scattered mediastinal nodes are present. A prevascular node measures 0.9 cm in short axis on image 17/2, formerly by my measurements the same on the PET-CT from 04/02/2018. Right lower paratracheal node 1.1 cm in short axis on image 18/2, formerly 1.2 cm. Subcarinal node 1.8 cm in short axis on image 24/2, previously the same by my measurements. Upper normal sized bilateral hilar  and infrahilar lymph nodes are present. Small left axillary and left subpectoral lymph nodes are observed, slightly reduced in prominence, with a left axillary lymph node on image 11/2 measuring 0.5 cm in short axis, previously 0.7 cm. Lungs/Pleura: Small persistent right pleural effusion. Peripheral interstitial accentuation favoring the upper lobes. Confluent scarring at the right lung apex. Possible radiation therapy related findings anteriorly in the right middle lobe and right lower lobe. Faint interstitial accentuation at the lung bases, subtle interstitial edema is not excluded. 5 mm right middle lobe pulmonary  nodule on image 82/6, stable. This is just below the minor fissure. Musculoskeletal: Right mastectomy. CT ABDOMEN PELVIS FINDINGS Hepatobiliary: Nodular hepatic contour with morphologic findings characteristic of hepatic cirrhosis. Cholecystectomy. No significant degree of biliary dilatation. No significant focal hepatic lesion. Pancreas: Unremarkable Spleen: Splenomegaly, spleen measures 16.8 by 7.9 by 14.6 cm (volume = 1000 cm^3). Adrenals/Urinary Tract: Both adrenal glands appear normal. Borderline urinary bladder wall thickening diffusely. Left mid kidney cyst anteriorly. 5 by 2 mm right mid kidney nonobstructive renal calculus on image 106/4. Stomach/Bowel: Unremarkable Vascular/Lymphatic: Splenorenal shunting. The portal vein appears patent but with some peripheral calcification along its anterior margin. Aortoiliac atherosclerotic vascular disease. Left periaortic lymph node 3.1 cm in short axis on image 70/2, formerly 2.4 cm. Stable reactive porta hepatis lymph nodes. Aortoiliac atherosclerotic vascular disease. Reproductive: Uterus absent.  Adnexa unremarkable. Other: Mild upper abdominal ascites. Subtle infiltration of the omentum and mesentery likely from edema, less likely to be from peritoneal metastatic deposits. Trace pelvic ascites. Stable mild edema in the perirectal space. Musculoskeletal: Prominent left and mild right degenerative hip arthropathy. Spondylosis and degenerative disc disease at L5-S1. IMPRESSION: 1. The dominant left periaortic lymph node has significantly increased in size, currently 3.1 cm in short axis, formerly 2.4 cm, compatible with malignancy. 2. There is notable hepatic cirrhosis with splenorenal shunting and uphill paraesophageal varices as well as some reactive lymph nodes along the porta hepatis. 3. Mildly prominent mediastinal lymph nodes are roughly stable compared to the prior exam. 4. Other imaging findings of potential clinical significance: Aortic Atherosclerosis  (ICD10-I70.0). Coronary atherosclerosis. Small persistent right pleural effusion. Cardiomegaly with interstitial accentuation in the lungs which may be from mild interstitial edema. Stable splenomegaly. Nonobstructive right nephrolithiasis. Mild ascites. Prominent left hip arthropathy. Stable 5 mm in diameter right middle lobe pulmonary nodule. Electronically Signed   By: Van Clines M.D.   On: 08/10/2018 13:51     ASSESSMENT: 63 y.o.  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    (2)  treated in the adjuvant setting with docetaxel/ cyclophosphamide given every 3 weeks. There were multiple and severe complications, and the  patient tolerated only two cycles, last dose 07/29/2012  (3) letrozole started 08/16/2012  (a) osteopenia, with a T score of -1.14 April 2013  (b) bone density on 08/14/2017 showed a T score of -2.7 osteoporosis  (c) discontinued with rising tumor markers and 1 enlarged left para-aortic lymph node  (4) postmastectomy radiation completed 12/24/2012  (5)  comorbidities include diabetes, hypertension, chronic liver disease with cirrhosis and fatty liver, and nutritional disturbance with temporarily dependence on PEG feeds (discontinued July 2014).  (6) restaging studies January 2016 show  (a) 8 mm and 4 mm Right middle lobe lung nodules, not metabolically active on  PET   (b) hypermetabolic 72.5 cm mixed solid/cystic lesion in upper pelvis, with associated adenopathy (see #7)  OVARIAN CANCER (7) status post exploratory laparotomy 06/27/2014 with total abdominal hysterectomy, bilateral salpingo-oophorectomy, para-aortic lymphadenectomy, omentectomy and radical tumor debulking for a left-sided clear cell ovarian cancer, pT1c pN1, stage IIIC .  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 despite being on Coumadin for PAF (subtherapeutic INR) with mobile Right common femoral clot  (a) status post thrombolysis 09/30/2014  (b) on Lovenox daily as of 10/02/2014, stopped April 2017  (c) IVC filter placed 10/26/2014 as mobile clot still noted Right groin  (d) IVC filter removed 01/30/2015 with documented resolution of the movable thrombus 01/16/2015  (e) normal quantitative D-dimer 08/06/2015 and 09/24/2015  (10) started abraxane 10/13/2014, repeated every 14 days for 6 cycles (12 doses), completed 03/26/2015  (11) genetics testing December 04, 2014 through the Breast[facility]Ovarian Cancer Panel [facility] no deleterious mutations in ATM, BARD1, BRCA1, BRCA2, BRIP1, CDH1, CHEK2, FANCC, MLH1, MSH2, MSH6, NBN, PALB2, PMS2, PTEN, RAD51C, RAD51D, STK11, TP53, and XRCC2.    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  (a) spleen measured 14.3 cm on CT scan of the abdomen 10/17/2016  ONGOING MONITORING: (14) pet SCAN 04/02/2018 shows retroperitoneal adenopathy, hypermetabolic and small left axillary and mediastinal lymph nodes  (a) Ca 125 is informative  (b) MRI of orbits 07/03/2018 no masses or findings of concern  (c) CT scans C/A/P on 08/10/2018 shows an enlarging left periaortic node, other nodes stable; also significant cirrhosis/ splenomegaly  (15) toremifene started 08/18/2018  (a) palbociclib added  04/13/22020 at 100 mg/ day 21/7   PLAN: [name] is a little over 6 years out from definitive surgery for her breast cancer and 4 years out from surgery for her ovarian cancer, with no symptoms related to either disease.  This is very favorable.  She does have a rising Ca1 25 and she has some very slowly growing adenopathy in fact really only one lymph node that grew.  Some of these readings may be being confounded by the fact that she has significant cirrhosis secondary to steatosis.  In any case given the rise in the Ca1 25 and the enlargement of 1 periaortic lymph node we have switched her treatments to toremifene and palbociclib.  She is doing well with these medications.  She seems to have mild fatigue from the palbociclib.  Unfortunately we are not able to check her counts right now because of the pandemic I have started her on a second level on the palbociclib so I do not anticipate significant cytopenias.  She is in the middle of her second week.  She should be finishing around May 5 and she should be restarting the next cycle 09/21/2018.  I have asked them to call us if they do not get the next shipment within a couple of days of completing the current cycle  Otherwise I Rhonda check in on them again in 4 weeks and hopefully 8 weeks from now she Rhonda be able to come  here in person  They know to call for any other issues that may develop before then.   [name], Rhonda Dad, MD  09/02/18 4:10 PM Medical Oncology and Hematology [name] [address] [place], [place] [address] Tel. [phone]    Fax. [phone]  I, Jacqualyn Posey am acting as a Education administrator for Chauncey Cruel, MD.   I, Lurline Del MD, have reviewed the above documentation for accuracy and completeness, and I agree with the above.

## 2018-09-15 MED FILL — IBRANCE 100 MG CAPSULE: 100 | 28 days supply | Qty: 21 | Fill #1

## 2018-09-16 ENCOUNTER — Other Ambulatory Visit: Payer: Self-pay | Admitting: Oncology

## 2018-09-16 ENCOUNTER — Telehealth: Payer: Self-pay

## 2018-09-16 NOTE — Telephone Encounter (Signed)
-----   Message from Roetta Sessions, [place] sent at 05/21/2018 12:20 PM EST ----- Regarding: FW: repeat RUQ U/S due May 2020  ----- Message ----- From: Yetta Flock, MD Sent: 05/21/2018  12:16 PM EST To: Roetta Sessions, CMA Subject: RE: repeat RUQ U/S due Jan 2020                RUQ [name] in May 2020. Thanks ----- Message ----- From: Roetta Sessions, CMA Sent: 05/21/2018  10:27 AM EST To: Yetta Flock, MD Subject: FW: repeat RUQ U/S due Jan 2020                Pt had CT in November.  When would you like her to have her next RUQ Ultrasound?  Thanks   ----- Message ----- From: Roetta Sessions, CMA Sent: 05/21/2018 To: Roetta Sessions, CMA Subject: repeat RUQ U/S due Jan 2020                    RUQ U/S due in Jan 2020 for cirrhosis of liver w/o ascites

## 2018-09-16 NOTE — Telephone Encounter (Signed)
Thanks Jan, I would do the RUQ [name] in September 2020. Thanks

## 2018-09-16 NOTE — Telephone Encounter (Signed)
Dr. Havery Moros - pt is due for U/S RUQ in May however I see [name] had CT of Abdomen/Pelvis on August 10, 2018 (Dr. Jana Hakim).  Please advise. Thank you.

## 2018-09-20 NOTE — Telephone Encounter (Signed)
This RN spoke to [facility] per his call stating they have received the [place] per [facility] mail order.  Per [facility] speciality phx - the shipment that went out last Wednesday ( contained [employer] ) was picked up by [employer] and unfortunately was [place].  Follow up has been made and shipments are tracked to be received today no later then 10 pm.  This RN informed [name] of above as well as in case shipment still not received- restarting a day or 2 later would not be detrimental to treatment plan.  [name] verbalized understanding.

## 2018-09-22 ENCOUNTER — Other Ambulatory Visit: Payer: Self-pay | Admitting: Family Medicine

## 2018-09-22 DIAGNOSIS — E1165 Type 2 diabetes mellitus with hyperglycemia: Secondary | ICD-10-CM

## 2018-09-27 ENCOUNTER — Telehealth: Payer: Self-pay | Admitting: Pharmacist

## 2018-09-27 NOTE — Telephone Encounter (Signed)
Oral Oncology Pharmacist Encounter  Received call from patient's husband, [name], with questions about copayments and Medicare insurance structure. Mr. Knight stated that his co-pays for all of their medications have increased for the May 2020 fill due to them being put into the donut hole which occurred due to large expense of Ibrance. Mr. Spitzley stated that he was not aware that Leslee Home was being filled on his insurance, as he thought that foundation copayment grant was covering entire cost of medication. Mr. Monroy informed that the pharmacy is billing his insurance first and then any copayment that is due with the pharmacy is being covered by foundation copayment grant. Mr. Smaldone instructed to return call to his insurance company to find out how far they are away from catastrophic coverage for their prescription medications. Usually 1 or 2 fills of an oral oncology medication will push patients through the donut hole and into their catastrophic coverage. Mr. Stines will reach out to his insurance company. He will then contact the pharmacy to only fill urgent medications at this time, in an effort to get through the donut hole. Once into catastrophic coverage, he will fill remainder of their medications, as all of the copayments should go down at that time.  All questions answered. Mr. Skoda expressed understanding and agreement with above plan. He knows to call the office with any additional questions or concerns.  Johny Drilling, PharmD, BCPS, BCOP  09/27/2018 12:22 PM Oral Oncology Clinic [phone]

## 2018-09-28 ENCOUNTER — Telehealth: Payer: Self-pay | Admitting: Oncology

## 2018-09-28 NOTE — Telephone Encounter (Signed)
Spoke with pt and spouse re 09/30/18 office visit and pt agreed to convert appt into Web[employer] mtg.  Emailed step-by-step instructions how to download and access [employer].

## 2018-09-29 ENCOUNTER — Telehealth: Payer: Self-pay | Admitting: Oncology

## 2018-09-29 NOTE — Telephone Encounter (Signed)
Contacted patient to verify webex appt for pre reg  °

## 2018-09-29 NOTE — Progress Notes (Signed)
[facility]  [phone] [phone]    ID: [name]   DOB: [birth year]  MR#: [id number]  XBM#:[id number]  Patient Care Team: Carollee Herter, Alferd Apa, DO as PCP - General (Family Medicine) Alejos Reinhardt, Virgie Dad, MD as Consulting Physician (Oncology) Excell Seltzer, MD as Consulting Physician (General Surgery) Everitt Amber, MD as Consulting Physician (Obstetrics and Gynecology) Gaynelle Arabian, MD as Consulting Physician (Orthopedic Surgery) Armbruster, Carlota Raspberry, MD as Consulting Physician (Gastroenterology) Garald Balding, MD as Consulting Physician (Orthopedic Surgery)  OTHER MD: Daniel Nones, MD; Alger Simons, MD  I connected with [name] on 09/30/18 at 10:30 AM EDT by video enabled telemedicine visit and verified that I am speaking with the correct person using two identifiers.   I discussed the limitations, risks, security and privacy concerns of performing an evaluation and management service by telemedicine and the availability of in-person appointments. I also discussed with the patient that there may be a patient responsible charge related to this service. The patient expressed understanding and agreed to proceed.   Other persons participating in the visit and their role in the encounter: Husband [name], daughter Abbott Pao- scribe   Patient's location: home  Provider's location: [place]    CHIEF COMPLAINT:  Estrogen receptor positive Breast Cancer; Ovarian cancer; DVT  CURRENT TREATMENT: toremifene, palbociclib   INTERVAL HISTORY: [name] was contacted today for follow-up and treatment of her estrogen receptor positive breast cancer.   She continues on palbociclib. She reports fatigue and denies nausea, rash, and diarrhea. She gets this mailed to her.  She also continues on toremifene with good tolerance. She notes 1-2 hot flashes and denies vaginal dryness.   [name]'s last bone density  screening on 08/14/2017 showed a T-score of -2.7, which is considered osteoporotic.  Since her last visit here, she has not undergone any additional studies.    Lab Results  Component Value Date   CA2729 39.7 (H) 05/17/2018   CA2729 32.6 05/19/2016   Lab Results  Component Value Date   CAN125 225.0 (H) 08/10/2018   CAN125 193.0 (H) 05/17/2018   CAN125 137.0 (H) 03/15/2018   CAN125 181.0 (H) 02/23/2018   CAN125 160.0 (H) 01/12/2018    REVIEW OF SYSTEMS: Ziasia reports doing well overall. Husband [name] reports that [name] has been more forgetful recently. He also reports that she has trouble walking and can't exercise. [name] does the shopping. The patient denies unusual headaches, visual changes, nausea, vomiting, stiff neck, dizziness, or gait imbalance. There has been no cough, phlegm production, or pleurisy, no chest pain or pressure, and no change in bowel or bladder habits. The patient denies fever, rash, bleeding, unexplained fatigue or unexplained weight loss. A detailed review of systems was otherwise entirely negative.   BREAST CANCER HISTORY: From the original consult note:  [name] noted a mass in her right breast mid December 2013, and as it did not spontaneously resolve over a couple of weeks she brought it to her primary physician's attention. She was set up for diagnostic mammography and right breast ultrasonography at the breast Center 05/10/2012. (Note that the patient's most recent prior mammography had been in October 2008). The current study showed a spiculated mass in the superior subareolar portion of the right breast measuring approximately 5 cm and associated with pleomorphic calcifications. This was firm and palpable. There was right nipple retraction and skin thickening. Ultrasound confirmed an irregularly marginated hypoechoic mass measuring 3.5 cm by ultrasound, and an abnormal  appearing lower right axillary lymph node measuring 2.6 cm.  Biopsies of both the breast mass  and the abnormal appearing lymph node were performed 05/21/2012. Both showed an invasive ductal carcinoma, grade 2, with similar prognostic panels (the breast mass was 100% estrogen and 73% progesterone receptor positive, with an MIB-1 of 5%; the lymph node was 100% estrogen 100% progesterone receptor positive, with an MIB-1 of 20%). Both masses were HER-2 negative.  Breast MRI obtained at [facility] imaging 05/29/2012 confirmed a dominant mass in the retroareolar right breast measuring 4.4 cm maximally. There was a satellite nodule inferior and lateral to this mass, measuring 1.7 cm. There were no other masses in either breast. Aside from the previously biopsied lymph node there were other mildly enhancing level I right axillary lymph nodes which did not appear pathologic. There was no other lymphadenopathy noted.    OVARIAN CANCER HISTORY: From the 06/16/2014 summary note:  "She was admitted to [facility] with a diagnosis of possible pneumonia in late December 2015. Chest x-ray showed some increased density in the right upper lobe. CT scan was suggested and was obtained by Dr. Inda Castle. This showed 2 small nodules in the right middle lobe, measuring 8 and 4 mm respectively. Dr. Inda Castle then contacted [name] for further evaluation and we set Emil up for a PET scan and which was performed late January. This showed the 2 nodules in the lung not to be hypermetabolic. This of course does not prove that they are not malignant. More importantly, there was a 13.7 cm cystic/solid mass in the upper pelvis associated with adenopathy,. All of this was hypermetabolic."  The patient was evaluated by gynecologic oncology and on 06/27/2014 she underwent surgery under Dr. Denman George. This consisted of an exploratory laparotomy with hysterectomy and abdominal salpingo-oophorectomy, as well as tumor debulking and omentectomy. 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 The tumor measured 11.5 cm, focally involve the ovarian capsule on the left; the uterus and right ovary and fallopian tube were unremarkable. One para-aortic lymph node was positive out of a total of 6 lymph nodes sampled (2 left common iliac, 2 left para-aortic, and to within the omental resection).  Her subsequent history is as detailed below   PAST MEDICAL HISTORY: Past Medical History:  Diagnosis Date  . Allergy   . Anemia    low iron hx  . Arthritis   . Breast cancer ([place]) 2014   a. 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 Cataract    removed ou  . Chronic combined systolic and diastolic CHF (congestive heart failure) ([facility])    a. 08/2012 Echo: EF 55-60%, no rwma, mod MR;  b. 07/2014 Echo: EF 45-50%, distal antsept HK, mod TR/MR, mildly bil-atrial enlargement.  . Clear Cell Ovarian Cancer    a. 06/2014 s/p TAH/BSO debulking/lymph node dissection;  b. Now on chemo.  . Depression   . Diabetes mellitus   . DJD (degenerative joint disease) of lumbar spine   . DVT (deep venous thrombosis) (H[facility])    hx of on HRT left leg ~2006  . Gallstones   . GERD (gastroesophageal reflux disease)   . Hyperlipidemia   . Hypertension   . Hypothyroidism   . Liver disease, chronic, with cirrhosis ([facility])    ? autoimmune  . OSA on CPAP  cpap 4.5 setting  . Paroxysmal atrial fibrillation (H[facility])    a. CHA2DS2VASc = 4-->coumadin.  [name] Physical deconditioning   . PPD positive, treated    rx inh   . Sleep apnea     PAST SURGICAL HISTORY: Past Surgical History:  Procedure Laterality Date  . ABDOMINAL HYSTERECTOMY N/A 06/27/2014   Procedure: HYSTERECTOMY ABDOMINAL TOTAL;  Surgeon: Everitt Amber, MD;  Location: [facility];  Service: Gynecology;  Laterality: N/A;  . BREAST BIOPSY     left breast  . BREAST EXCISIONAL BIOPSY Left 2002  . CATARACT  EXTRACTION    . CHOLECYSTECTOMY    . EYE SURGERY    . FOOT SURGERY    . history of chemotherapy x 2 treatments, radiation tx  2014  . LAPAROTOMY N/A 06/27/2014   Procedure: EXPLORATORY LAPAROTOMY;  Surgeon: Everitt Amber, MD;  Location: [facility];  Service: Gynecology;  Laterality: N/A;  . MASTECTOMY Right 2014  . MASTECTOMY MODIFIED RADICAL  06/08/2012   Procedure: MASTECTOMY MODIFIED RADICAL;  Surgeon: Edward Jolly, MD;  Location: [facility];  Service: General;  Laterality: Right;  . pac removed  2015  . PEG TUBE PLACEMENT     peg removed 2015  . PERCUTANEOUS LIVER BIOPSY    . PORTACATH PLACEMENT  06/08/2012   Procedure: INSERTION PORT-A-CATH;  Surgeon: Edward Jolly, MD;  Location: [facility];  Service: General;  Laterality: Left;  . SALPINGOOPHORECTOMY Bilateral 06/27/2014   Procedure: BILATERAL SALPINGO OOPHORECTOMY/TUMOR DEBULKING/LYMPHNODE DISSECTION, OMENTECTOMY;  Surgeon: Everitt Amber, MD;  Location: [facility];  Service: Gynecology;  Laterality: Bilateral;  . TUBAL LIGATION      FAMILY HISTORY Family History  Problem Relation Age of Onset  . Diabetes Mother   . Hypertension Mother   . Arthritis Mother   . Heart disease Mother   . Heart failure Mother   . Other Mother        benign breast mass  . Stroke Father   . Heart disease Father   . Dementia Father   . Diabetes Paternal Grandmother   . Colon cancer Paternal Grandfather        dx. 70s-80s  . Colon cancer Maternal Grandfather   . Other Daughter        potentially has had negative BRCA testing  . Other Daughter        hysterectomy; potentially has had negative genetic testing  . Rectal cancer Neg Hx   . Esophageal cancer Neg Hx   . Liver cancer Neg Hx    The patient's father died in his 80s with a history of dementia. He had had prior strokes. The patient's mother died in her 56s, with a history of congestive heart failure. Tejal had no brothers, one sister. There is no history of breast or ovarian cancer in the family.    GYNECOLOGIC HISTORY: Menarche age 69, first live birth age 29, she is GX P2, menopause approximately 15 years ago, on hormone replacement until 2010.   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 Her husband [name] works for [employer]. Daughter Paul Half is a Physiological scientist and lives in [place]. Daughter Santiago Bur and her family (husband and 2 children aged 80 and 3 years) currently live with the patient. Sully is a member of a [employer].     ADVANCED DIRECTIVES: Not in place   HEALTH MAINTENANCE: (Updated October 2014) Social History  Tobacco Use  . Smoking status: Former Smoker    Packs/day: 1.00    Years: 1.00    Pack years: 1.00  . Smokeless tobacco: Former Network engineer Use Topics  . Alcohol use: No  . Drug use: No    Comment: quit age 18 only smoked as a teen 1 year     Colonoscopy: Never  PAP: Does not recall  Bone density:  08/14/2017 showed a T score of -2.7 osteoporosis  Lipid panel:   Allergies  Allergen Reactions  . Olmesartan Medoxomil Cough  . Adhesive [Tape] Rash  . Tetracycline Hcl Other (See Comments)  . Venlafaxine Other (See Comments)    severe dry mouth    Current Outpatient Medications  Medication Sig Dispense Refill  . acetaminophen (TYLENOL) 500 MG tablet Take 500 mg by mouth every 6 (six) hours as needed for moderate pain.    [name] ALPRAZolam (XANAX) 0.5 MG tablet Take 1 tablet (0.5 mg total) by mouth 2 (two) times daily as needed for anxiety. 180 tablet 0  . Blood Glucose Monitoring Suppl (ONETOUCH VERIO) w/Device KIT Use as directed twice a day.  Dx code: E11.9 1 kit 0  . buPROPion (WELLBUTRIN XL) 150 MG 24 hr tablet TAKE 1 TABLET BY MOUTH  DAILY 90 tablet 1  . clotrimazole (MYCELEX) 10 MG troche Take 1 tablet (10 mg total) by mouth 3 (three) times daily. 30 tablet 0  . diazepam (VALIUM) 5 MG tablet Pt to  take 1 tab 1 hour before MRI and may repeat if needed 2 tablet 0  . escitalopram (LEXAPRO) 20 MG tablet TAKE 1 TABLET BY MOUTH  EVERY MORNING 90 tablet 1  . famotidine (PEPCID) 20 MG tablet Take 1 tablet (20 mg total) by mouth daily. (Patient not taking: Reported on 05/18/2018) 30 tablet 0  . furosemide (LASIX) 20 MG tablet TAKE 1 TABLET BY MOUTH  DAILY 90 tablet 0  . glimepiride (AMARYL) 2 MG tablet TAKE 1 TABLET BY MOUTH TWO  TIMES DAILY 180 tablet 0  . ketoconazole (NIZORAL) 2 % cream Apply 1 application topically daily. 15 g 3  . Lancets (ONETOUCH DELICA PLUS TMHDQQ22L) MISC USE TO CHECK BLOOD SUGAR  TWO TIMES DAILY 200 each 1  . letrozole (FEMARA) 2.5 MG tablet TAKE 1 TABLET BY MOUTH  DAILY 90 tablet 4  . levothyroxine (SYNTHROID, LEVOTHROID) 150 MCG tablet TAKE 1 TABLET BY MOUTH  DAILY 90 tablet 1  . lidocaine-prilocaine (EMLA) cream APPLY TOPICALLY OVER PORT  AREA 1-2 HOURS BEFORE  CHEMOTHERAPY AS NEEDED 30 g 1  . LORazepam (ATIVAN) 0.5 MG tablet Take 1 tablet (0.5 mg total) by mouth every 8 (eight) hours. Ativan 0.16m - Take 1 tablet prior to MRI imaging - may repeat if needed for anxiety. 30 tablet 0  . metFORMIN (GLUCOPHAGE-XR) 500 MG 24 hr tablet TAKE 2 TABLETS BY MOUTH  EVERY EVENING 180 tablet 1  . metroNIDAZOLE (METROGEL) 1 % gel Apply topically daily. 45 g 0  . nadolol (CORGARD) 40 MG tablet Take 1 tablet (40 mg total) by mouth daily. 90 tablet 3  . NONFORMULARY OR COMPOUNDED ITEM Lipid panel   Dx code: E78.5, CMP Dx Code: I10,  A1C Dx code: E11.9 nonfasting is ok 1 each 0  . nystatin (MYCOSTATIN) 100000 UNIT/ML suspension Take 10 mLs (1,000,000 Units total) by mouth 4 (four) times daily. 240 mL 0  . OLANZapine (ZYPREXA) 2.5 MG tablet TAKE 1 TABLET BY MOUTH  NIGHTLY AT BEDTIME 90 tablet 1  .  omeprazole (PRILOSEC) 20 MG capsule Take 1 capsule (20 mg total) by mouth daily. (Patient not taking: Reported on 05/18/2018) 90 capsule 3  . ondansetron (ZOFRAN) 8 MG tablet Take 1 tablet (8 mg  total) by mouth every 8 (eight) hours as needed for nausea or vomiting. 270 tablet 0  . ONETOUCH VERIO test strip USE TO CHECK BLOOD SUGAR  TWO TIMES DAILY 100 each 1  . oxybutynin (DITROPAN) 5 MG tablet Take 1 tablet (5 mg total) by mouth 3 (three) times daily. 270 tablet 03  . palbociclib (IBRANCE) 100 MG capsule Take 1 capsule (100 mg total) by mouth daily with breakfast. Take whole with food. Take for 21 days on, 7 days off, repeat every 28 days. 21 capsule 6  . potassium chloride (K-DUR,KLOR-CON) 10 MEQ tablet TAKE 1 TABLET BY MOUTH TWO  TIMES DAILY 180 tablet 1  . pravastatin (PRAVACHOL) 10 MG tablet TAKE 1 TABLET BY MOUTH  DAILY 90 tablet 1  . propranolol (INDERAL) 20 MG tablet TAKE 1 TABLET BY MOUTH TWO  TIMES DAILY 180 tablet 3  . Toremifene Citrate (FARESTON) 60 MG tablet Take 1 tablet (60 mg total) by mouth daily. 90 tablet 4   No current facility-administered medications for this visit.     OBJECTIVE: Middle-aged white [name] in a wheelchair  There were no vitals filed for this visit.   There is no height or weight on file to calculate BMI.    ECOG FS: 1 There were no vitals filed for this visit.  Lab Results  Component Value Date   WBC 4.7 08/10/2018   NEUTROABS 3.6 08/10/2018   HGB 11.7 (L) 08/10/2018   HCT 35.9 (L) 08/10/2018   MCV 101.4 (H) 08/10/2018   PLT 116 (L) 08/10/2018     Chemistry      Component Value Date/Time   NA 140 08/10/2018 0934   NA 140 04/28/2017 0809   K 3.9 08/10/2018 0934   K 3.4 (L) 04/28/2017 0809   CL 108 08/10/2018 0934   CL 104 10/29/2012 1058   CO2 20 (L) 08/10/2018 0934   CO2 22 04/28/2017 0809   BUN 7 (L) 08/10/2018 0934   BUN 6.5 (L) 04/28/2017 0809   CREATININE 0.57 08/10/2018 0934   CREATININE 0.6 04/28/2017 0809      Component Value Date/Time   CALCIUM 8.7 (L) 08/10/2018 0934   CALCIUM 8.4 04/28/2017 0809   ALKPHOS 152 (H) 08/10/2018 0934   ALKPHOS 143 04/28/2017 0809   AST 53 (H) 08/10/2018 0934   AST 53 (H) 04/28/2017  0809   ALT 24 08/10/2018 0934   ALT 21 04/28/2017 0809   BILITOT 2.0 (H) 08/10/2018 0934   BILITOT 2.96 (H) 04/28/2017 0809      STUDIES: No results found.   ASSESSMENT: 62 y.o.  BRCA negative [name]   BREAST CANCER (1)  status post right mastectomy  06/08/2012 for a pT3, pN1a, stage IIIA invasive ductal carcinoma of overlapping sites, grade 2,  estrogen and progesterone receptor positive, HER-2/neu negative, with an MIB-1 of 20%.    (2)  treated in the adjuvant setting with docetaxel/ cyclophosphamide given every 3 weeks. There were multiple and severe complications, and the  patient tolerated only two cycles, last dose 07/29/2012  (3) letrozole started 08/16/2012  (a) osteopenia, with a T score of -1.14 April 2013  (b) bone density on 08/14/2017 showed a T score of -2.7 osteoporosis  (c) discontinued with rising tumor markers and 1 enlarged left para-aortic lymph  node  (4) postmastectomy radiation completed 12/24/2012  (5)  comorbidities include diabetes, hypertension, chronic liver disease with cirrhosis and fatty liver, and nutritional disturbance with temporarily dependence on PEG feeds (discontinued July 2014).  (6) restaging studies January 2016 show  (a) 8 mm and 4 mm Right middle lobe lung nodules, not metabolically active on PET   (b) hypermetabolic 19.4 cm mixed solid/cystic lesion in upper pelvis, with associated adenopathy (see #7)  OVARIAN CANCER (7) status post exploratory laparotomy 06/27/2014 with total abdominal hysterectomy, bilateral salpingo-oophorectomy, para-aortic lymphadenectomy, omentectomy and radical tumor debulking for a left-sided clear cell ovarian cancer, pT1c pN1, stage IIIC .  (8) adjuvant chemotherapy consisting of carboplatin and paclitaxel given weekly days 1 and 8 of each 21 day cycle, for 6-8 cycles as tolerated, started 07/17/2014, with onpro support  (a) day 8 cycle 2 omitted and further treatments cancelled because of an  episode of Klebsiella sepsis requiring intensive care unit admission-- last chemotherapy dose 03/26/2015  (9) bilateral lower extremity DVTs documented 09/25/2014 despite being on Coumadin for PAF (subtherapeutic INR) with mobile Right common femoral clot  (a) status post thrombolysis 09/30/2014  (b) on Lovenox daily as of 10/02/2014, stopped April 2017  (c) IVC filter placed 10/26/2014 as mobile clot still noted Right groin  (d) IVC filter removed 01/30/2015 with documented resolution of the movable thrombus 01/16/2015  (e) normal quantitative D-dimer 08/06/2015 and 09/24/2015  (10) started abraxane 10/13/2014, repeated every 14 days for 6 cycles (12 doses), completed 03/26/2015  (11) genetics testing December 04, 2014 through the Breast/Ovarian Cancer Panel [facility] no deleterious mutations in ATM, BARD1, BRCA1, BRCA2, BRIP1, CDH1, CHEK2, FANCC, MLH1, MSH2, MSH6, NBN, PALB2, PMS2, PTEN, RAD51C, RAD51D, STK11, TP53, and XRCC2.    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  (a) spleen measured 14.3 cm on CT scan of the abdomen 10/17/2016  ONGOING MONITORING: (14) pet SCAN 04/02/2018 shows retroperitoneal adenopathy, hypermetabolic and small left axillary and mediastinal lymph nodes  (a) Ca 125 is informative  (b) MRI of orbits 07/03/2018 no masses or findings of concern  (c) CT scans C/A/P on 08/10/2018 shows an enlarging left periaortic node, other nodes stable; also significant cirrhosis/ splenomegaly  (15) toremifene started 08/18/2018  (a) palbociclib added 04/13/22020 at 100 mg/ day 21/7   PLAN: [name] is 6-1/2 years out from definitive surgery for her breast cancer and 4 years out from definitive surgery for her ovarian cancer.  Clinically she is very stable without any overt symptoms suggestive of disease  recurrence or progression  We switched her medications because of her rising Ca1 25.  There are also some questionable findings on her scans.  Unfortunately we cannot follow this at present because of the current pandemic.  On the plus side she is tolerating the toremifene and palbociclib well and we are going to be continuing that for the next 3 months.  Tentatively I am making her a return appointment with me for August.  Hopefully we can make an in person visit at that time with lab work and perhaps we can get CT scans on the same day  I encouraged her to try to be a little bit more active.  She does have cirrhosis and per the family we may be getting into a little bit of encephalopathy.  Unfortunately there is little we can do about that other than  supportive care  They know to call for any other issue that may develop before the next visit.   Dwaine Pringle, Virgie Dad, MD  09/30/18 10:42 AM Medical Oncology and Hematology [facility] [address] [place], [place] [address] Tel. [phone]    Fax. [phone]   I, Wilburn Mylar, am acting as scribe for Dr. Virgie Dad. [name].  I, Lurline Del MD, have reviewed the above documentation for accuracy and completeness, and I agree with the above.

## 2018-09-30 ENCOUNTER — Inpatient Hospital Stay: Payer: Medicare Other | Attending: Oncology | Admitting: Oncology

## 2018-09-30 DIAGNOSIS — Z9011 Acquired absence of right breast and nipple: Secondary | ICD-10-CM

## 2018-09-30 DIAGNOSIS — C569 Malignant neoplasm of unspecified ovary: Secondary | ICD-10-CM

## 2018-09-30 DIAGNOSIS — Z7984 Long term (current) use of oral hypoglycemic drugs: Secondary | ICD-10-CM

## 2018-09-30 DIAGNOSIS — Z170 Estrogen receptor positive status [ER+]: Secondary | ICD-10-CM | POA: Diagnosis not present

## 2018-09-30 DIAGNOSIS — Z90722 Acquired absence of ovaries, bilateral: Secondary | ICD-10-CM

## 2018-09-30 DIAGNOSIS — C50811 Malignant neoplasm of overlapping sites of right female breast: Secondary | ICD-10-CM | POA: Diagnosis not present

## 2018-09-30 DIAGNOSIS — I10 Essential (primary) hypertension: Secondary | ICD-10-CM

## 2018-09-30 DIAGNOSIS — C50011 Malignant neoplasm of nipple and areola, right female breast: Secondary | ICD-10-CM

## 2018-09-30 DIAGNOSIS — Z79899 Other long term (current) drug therapy: Secondary | ICD-10-CM

## 2018-09-30 DIAGNOSIS — E039 Hypothyroidism, unspecified: Secondary | ICD-10-CM

## 2018-09-30 DIAGNOSIS — Z90710 Acquired absence of both cervix and uterus: Secondary | ICD-10-CM

## 2018-09-30 DIAGNOSIS — Z87891 Personal history of nicotine dependence: Secondary | ICD-10-CM

## 2018-09-30 DIAGNOSIS — C50012 Malignant neoplasm of nipple and areola, left female breast: Secondary | ICD-10-CM

## 2018-09-30 DIAGNOSIS — E119 Type 2 diabetes mellitus without complications: Secondary | ICD-10-CM

## 2018-09-30 DIAGNOSIS — Z9079 Acquired absence of other genital organ(s): Secondary | ICD-10-CM

## 2018-10-01 ENCOUNTER — Telehealth: Payer: Self-pay | Admitting: Oncology

## 2018-10-01 NOTE — Telephone Encounter (Signed)
Called regarding schedule °

## 2018-10-14 MED FILL — IBRANCE 100 MG CAPSULE: 100 | 28 days supply | Qty: 21 | Fill #2

## 2018-10-24 ENCOUNTER — Other Ambulatory Visit: Payer: Self-pay | Admitting: Oncology

## 2018-10-29 ENCOUNTER — Other Ambulatory Visit: Payer: Self-pay | Admitting: *Deleted

## 2018-10-29 ENCOUNTER — Other Ambulatory Visit: Payer: Self-pay | Admitting: Oncology

## 2018-10-29 MED ORDER — ALPRAZOLAM 0.5 MG PO TABS: 0.5000 mg | ORAL_TABLET | Freq: Two times a day (BID) | ORAL | 0 refills | Status: DC | PRN

## 2018-11-02 ENCOUNTER — Other Ambulatory Visit: Payer: Self-pay | Admitting: Oncology

## 2018-11-09 ENCOUNTER — Telehealth: Payer: Self-pay | Admitting: Pharmacist

## 2018-11-09 DIAGNOSIS — C50811 Malignant neoplasm of overlapping sites of right female breast: Secondary | ICD-10-CM

## 2018-11-09 MED ORDER — PALBOCICLIB 100 MG PO TABS: 100.0000 mg | ORAL_TABLET | Freq: Every day | ORAL | 6 refills | Status: DC

## 2018-11-09 NOTE — Telephone Encounter (Signed)
Oral Oncology Pharmacist Encounter  Ibrance (palbociclib) is changing formulation from capsules to tablets. New Ibrance prescription for the 100 mg tablets has been E scribed to the [facility].  Johny Drilling, PharmD, BCPS, BCOP  11/09/2018  3:58 PM Oral Oncology Clinic [phone]

## 2018-11-11 MED FILL — TOREMIFENE CITRATE 60 MG TA: 60 | 90 days supply | Qty: 90 | Fill #1

## 2018-11-11 MED FILL — IBRANCE 100 MG TABS: 100 | 28 days supply | Qty: 21 | Fill #0

## 2018-11-28 ENCOUNTER — Other Ambulatory Visit: Payer: Self-pay | Admitting: Family Medicine

## 2018-11-28 DIAGNOSIS — I10 Essential (primary) hypertension: Secondary | ICD-10-CM

## 2018-12-07 ENCOUNTER — Telehealth: Payer: Self-pay | Admitting: Internal Medicine

## 2018-12-07 DIAGNOSIS — G4733 Obstructive sleep apnea (adult) (pediatric): Secondary | ICD-10-CM
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
## 2018-12-08 ENCOUNTER — Telehealth: Payer: Self-pay | Admitting: Internal Medicine

## 2018-12-08 NOTE — Telephone Encounter (Signed)
Order for replacement CPAP has been placed to [facility]. Pt preferred telephone visit as she and her spouse could not make transportation arrangements for their grandchildren. Routing to CY as an [place]. Nothing further needed at this time.

## 2018-12-08 NOTE — Telephone Encounter (Signed)
Called and spoke w/ pt's spouse, [name] (on [place]). [name] states pt received a phone call from our office, however, I am unable to locate documentation of such. I let him know it was likely to confirm pt's telephonic appt w/ CY tomorrow 12/09/2018 at 11:00 AM. I also let [name] know that we have placed an order for replacement CPAP machine for pt. [name] verbalized understanding with no additional questions. Nothing further needed at this time.

## 2018-12-09 ENCOUNTER — Encounter: Payer: Self-pay | Admitting: Internal Medicine

## 2018-12-09 ENCOUNTER — Other Ambulatory Visit: Payer: Self-pay

## 2018-12-09 ENCOUNTER — Ambulatory Visit (INDEPENDENT_AMBULATORY_CARE_PROVIDER_SITE_OTHER): Payer: Medicare Other | Admitting: Internal Medicine

## 2018-12-09 DIAGNOSIS — G4733 Obstructive sleep apnea (adult) (pediatric): Secondary | ICD-10-CM | POA: Diagnosis not present

## 2018-12-09 MED FILL — IBRANCE 100 MG TABS: 100 | 28 days supply | Qty: 21 | Fill #1
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
## 2018-12-12 ENCOUNTER — Other Ambulatory Visit: Payer: Self-pay | Admitting: Family Medicine

## 2018-12-13 ENCOUNTER — Telehealth: Payer: Self-pay | Admitting: Internal Medicine

## 2018-12-13 NOTE — Telephone Encounter (Signed)
DMEs are who do authorizations for cpap. Called and spoke with pt's husband [name] letting him know this info and stated him to contact DME so they could take care of contacting insurance company. [name] expressed understanding. Nothing further needed.

## 2018-12-16 ENCOUNTER — Telehealth: Payer: Self-pay | Admitting: Internal Medicine

## 2018-12-16 NOTE — Telephone Encounter (Signed)
Spoke with a rep from [facility] and she is asking for a cpt code to proceed with the call. I dont have a cpt code and the DME usually handles these prior authorizations with the insurance. We can call [facility] in the morning to see if they can help [name] figure out what is needed for pt. Will hold in triage.

## 2018-12-17 NOTE — Telephone Encounter (Signed)
Spoke with a rep with [employer]. Advised them that our office does not handle PA's for CPAP machines. They would need to contact the DME. Nothing further was needed at this time.

## 2018-12-23 ENCOUNTER — Ambulatory Visit (INDEPENDENT_AMBULATORY_CARE_PROVIDER_SITE_OTHER): Payer: Medicare Other | Admitting: Family Medicine

## 2018-12-23 ENCOUNTER — Other Ambulatory Visit: Payer: Self-pay

## 2018-12-23 ENCOUNTER — Encounter: Payer: Self-pay | Admitting: Family Medicine

## 2018-12-23 VITALS — BP 116/61 | HR 87 | Temp 97.3°F | Resp 18 | Ht 65.0 in

## 2018-12-23 DIAGNOSIS — R300 Dysuria: Secondary | ICD-10-CM

## 2018-12-23 DIAGNOSIS — R350 Frequency of micturition: Secondary | ICD-10-CM | POA: Diagnosis not present

## 2018-12-23 LAB — POC URINALSYSI DIPSTICK (AUTOMATED)
Bilirubin, UA: NEGATIVE
Glucose, UA: NEGATIVE
Ketones, UA: NEGATIVE
Nitrite, UA: POSITIVE
Protein, UA: POSITIVE — AB
Spec Grav, UA: 1.02 (ref 1.010–1.025)
Urobilinogen, UA: 2 E.U./dL — AB
pH, UA: 6.5 (ref 5.0–8.0)

## 2018-12-23 MED ORDER — CEPHALEXIN 500 MG PO CAPS: 500.0000 mg | ORAL_CAPSULE | Freq: Four times a day (QID) | ORAL | 0 refills | Status: DC

## 2018-12-23 MED FILL — CEPHALEXIN 500 MG CAPSULE: 500 | 4 days supply | Qty: 14 | Fill #0

## 2018-12-23 NOTE — Patient Instructions (Signed)
Urinary Tract Infection, Adult A urinary tract infection (UTI) is an infection of any part of the urinary tract. The urinary tract includes:  The kidneys.  The ureters.  The bladder.  The urethra. These organs make, store, and get rid of pee (urine) in the body. What are the causes? This is caused by germs (bacteria) in your genital area. These germs grow and cause swelling (inflammation) of your urinary tract. What increases the risk? You are more likely to develop this condition if:  You have a small, thin tube (catheter) to drain pee.  You cannot control when you pee or poop (incontinence).  You are female, and: ? You use these methods to prevent pregnancy: ? A medicine that kills sperm (spermicide). ? A device that blocks sperm (diaphragm). ? You have low levels of a female hormone (estrogen). ? You are pregnant.  You have genes that add to your risk.  You are sexually active.  You take antibiotic medicines.  You have trouble peeing because of: ? A prostate that is bigger than normal, if you are female. ? A blockage in the part of your body that drains pee from the bladder (urethra). ? A kidney stone. ? A nerve condition that affects your bladder (neurogenic bladder). ? Not getting enough to drink. ? Not peeing often enough.  You have other conditions, such as: ? Diabetes. ? A weak disease-fighting system (immune system). ? Sickle cell disease. ? Gout. ? Injury of the spine. What are the signs or symptoms? Symptoms of this condition include:  Needing to pee right away (urgently).  Peeing often.  Peeing small amounts often.  Pain or burning when peeing.  Blood in the pee.  Pee that smells bad or not like normal.  Trouble peeing.  Pee that is cloudy.  Fluid coming from the vagina, if you are female.  Pain in the belly or lower back. Other symptoms include:  Throwing up (vomiting).  No urge to eat.  Feeling mixed up (confused).  Being tired  and grouchy (irritable).  A fever.  Watery poop (diarrhea). How is this treated? This condition may be treated with:  Antibiotic medicine.  Other medicines.  Drinking enough water. Follow these instructions at home:  Medicines  Take over-the-counter and prescription medicines only as told by your doctor.  If you were prescribed an antibiotic medicine, take it as told by your doctor. Do not stop taking it even if you start to feel better. General instructions  Make sure you: ? Pee until your bladder is empty. ? Do not hold pee for a long time. ? Empty your bladder after sex. ? Wipe from front to back after pooping if you are a female. Use each tissue one time when you wipe.  Drink enough fluid to keep your pee pale yellow.  Keep all follow-up visits as told by your doctor. This is important. Contact a doctor if:  You do not get better after 1-2 days.  Your symptoms go away and then come back. Get help right away if:  You have very bad back pain.  You have very bad pain in your lower belly.  You have a fever.  You are sick to your stomach (nauseous).  You are throwing up. Summary  A urinary tract infection (UTI) is an infection of any part of the urinary tract.  This condition is caused by germs in your genital area.  There are many risk factors for a UTI. These include having a small, thin   tube to drain pee and not being able to control when you pee or poop.  Treatment includes antibiotic medicines for germs.  Drink enough fluid to keep your pee pale yellow. This information is not intended to replace advice given to you by your health care provider. Make sure you discuss any questions you have with your health care provider. Document Released: 10/15/2007 Document Revised: 04/15/2018 Document Reviewed: 11/05/2017 [employer] Patient Education  2020 [employer]  

## 2018-12-23 NOTE — Progress Notes (Signed)
Patient ID: [name], female    DOB: December 13, 1955  Age: 63 y.o. MRN: [id number]    Subjective:  Subjective  HPI [name] presents for c/o dysuria and urinary frequency  No fever, no abd pain   Review of Systems  Constitutional: Negative for appetite change, diaphoresis, fatigue and unexpected weight change.  Eyes: Negative for pain, redness and visual disturbance.  Respiratory: Negative for cough, chest tightness, shortness of breath and wheezing.   Cardiovascular: Negative for chest pain, palpitations and leg swelling.  Endocrine: Negative for cold intolerance, heat intolerance, polydipsia, polyphagia and polyuria.  Genitourinary: Positive for dysuria and frequency. Negative for difficulty urinating.  Neurological: Negative for dizziness, light-headedness, numbness and headaches.    History Past Medical History:  Diagnosis Date   Allergy    Anemia    low iron hx   Arthritis    Breast cancer ([place]) 2014   a. 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 08/2012 Echo: EF 55-60%, no rwma, mod MR;  b. 07/2014 Echo: EF 45-50%, distal antsept HK, mod TR/MR, mildly bil-atrial enlargement.   Clear Cell Ovarian Cancer    a. 06/2014 s/p TAH/BSO debulking/lymph node dissection;  b. Now on chemo.   Depression    Diabetes mellitus    DJD (degenerative joint disease) of lumbar spine    DVT (deep venous thrombosis) (HCC)    hx of on HRT left leg ~2006   Gallstones    GERD (gastroesophageal reflux disease)    Hyperlipidemia    Hypertension    Hypothyroidism    Liver disease, chronic, with cirrhosis ([place])    ? autoimmune   OSA on CPAP    cpap 4.5 setting   Paroxysmal atrial fibrillation (HCC)    a. CHA2DS2VASc = 4-->coumadin.   Physical  deconditioning    PPD positive, treated    rx inh    Sleep apnea     She has a past surgical history that includes Tubal ligation; Cholecystectomy; Foot surgery; Eye surgery; Cataract extraction; Percutaneous liver biopsy; Breast biopsy; Mastectomy modified radical (06/08/2012); Portacath placement (06/08/2012); PEG tube placement; history of chemotherapy x 2 treatments, radiation tx (2014); pac removed (2015); laparotomy (N/A, 06/27/2014); Abdominal hysterectomy (N/A, 06/27/2014); Salpingoophorectomy (Bilateral, 06/27/2014); Mastectomy (Right, 2014); and Breast excisional biopsy (Left, 2002).   Her family history includes Arthritis in her mother; Colon cancer in her maternal grandfather and paternal grandfather; Dementia in her father; Diabetes in her mother and paternal grandmother; Heart disease in her father and mother; Heart failure in her mother; Hypertension in her mother; Other in her daughter, daughter, and mother; Stroke in her father.She reports that she quit smoking about 20 years ago. She has a 1.00 pack-year smoking history. She has quit using smokeless tobacco. She reports that she does not drink alcohol or use drugs.  Current Outpatient Medications on File Prior to Visit  Medication Sig Dispense Refill   acetaminophen (TYLENOL) 500 MG tablet Take 500 mg by mouth every 6 (six) hours as needed for moderate pain.     ALPRAZolam (XANAX) 0.5 MG tablet TAKE 1 TABLET BY MOUTH  TWICE DAILY AS NEEDED FOR  ANXIETY 180 tablet 0   Blood Glucose Monitoring Suppl (ONETOUCH VERIO) w/Device KIT Use as directed twice a day.  Dx code:  E11.9 1 kit 0   buPROPion (WELLBUTRIN XL) 150 MG 24 hr tablet TAKE 1 TABLET BY MOUTH  DAILY 90 tablet 1   clotrimazole (MYCELEX) 10 MG troche Take 1 tablet (10 mg total) by mouth 3 (three) times daily. 30 tablet 0   diazepam (VALIUM) 5 MG tablet Pt to take 1 tab 1 hour before MRI and may repeat if needed 2 tablet 0   escitalopram (LEXAPRO) 20 MG tablet TAKE 1 TABLET  BY MOUTH  EVERY MORNING 90 tablet 1   famotidine (PEPCID) 20 MG tablet Take 1 tablet (20 mg total) by mouth daily. 30 tablet 0   furosemide (LASIX) 20 MG tablet TAKE 1 TABLET BY MOUTH  DAILY 90 tablet 0   glimepiride (AMARYL) 2 MG tablet TAKE 1 TABLET BY MOUTH TWO  TIMES DAILY 180 tablet 0   ketoconazole (NIZORAL) 2 % cream Apply 1 application topically daily. 15 g 3   Lancets (ONETOUCH DELICA PLUS [id number]) MISC USE TO CHECK BLOOD SUGAR  TWO TIMES DAILY 200 each 1   letrozole (FEMARA) 2.5 MG tablet TAKE 1 TABLET BY MOUTH  DAILY 90 tablet 4   levothyroxine (SYNTHROID, LEVOTHROID) 150 MCG tablet TAKE 1 TABLET BY MOUTH  DAILY 90 tablet 1   lidocaine-prilocaine (EMLA) cream APPLY TOPICALLY OVER PORT  AREA 1-2 HOURS BEFORE  CHEMOTHERAPY AS NEEDED 30 g 1   LORazepam (ATIVAN) 0.5 MG tablet Take 1 tablet (0.5 mg total) by mouth every 8 (eight) hours. Ativan 0.1m - Take 1 tablet prior to MRI imaging - may repeat if needed for anxiety. 30 tablet 0   metFORMIN (GLUCOPHAGE-XR) 500 MG 24 hr tablet TAKE 2 TABLETS BY MOUTH  EVERY EVENING 180 tablet 1   metroNIDAZOLE (METROGEL) 1 % gel Apply topically daily. 45 g 0   nadolol (CORGARD) 40 MG tablet Take 1 tablet (40 mg total) by mouth daily. 90 tablet 3   NONFORMULARY OR COMPOUNDED ITEM Lipid panel   Dx code: E78.5, CMP Dx Code: I10,  A1C Dx code: E11.9 nonfasting is ok 1 each 0   nystatin (MYCOSTATIN) 100000 UNIT/ML suspension Take 10 mLs (1,000,000 Units total) by mouth 4 (four) times daily. 240 mL 0   OLANZapine (ZYPREXA) 2.5 MG tablet TAKE 1 TABLET BY MOUTH  NIGHTLY AT BEDTIME 90 tablet 1   omeprazole (PRILOSEC) 20 MG capsule Take 1 capsule (20 mg total) by mouth daily. 90 capsule 3   ondansetron (ZOFRAN) 8 MG tablet Take 1 tablet (8 mg total) by mouth every 8 (eight) hours as needed for nausea or vomiting. 270 tablet 0   ONETOUCH VERIO test strip USE TO CHECK BLOOD SUGAR  TWO TIMES DAILY 100 each 1   oxybutynin (DITROPAN) 5 MG tablet  Take 1 tablet (5 mg total) by mouth 3 (three) times daily. 270 tablet 03   palbociclib (IBRANCE) 100 MG tablet Take 1 tablet (100 mg total) by mouth daily. Take for 21 days on, 7 days off, repeat every 28 days. 21 tablet 6   potassium chloride (K-DUR) 10 MEQ tablet TAKE 1 TABLET BY MOUTH TWO  TIMES DAILY 180 tablet 0   pravastatin (PRAVACHOL) 10 MG tablet TAKE 1 TABLET BY MOUTH  DAILY 90 tablet 1   propranolol (INDERAL) 20 MG tablet TAKE 1 TABLET BY MOUTH TWO  TIMES DAILY 180 tablet 3   Toremifene Citrate (FARESTON) 60 MG tablet Take 1 tablet (60 mg total) by mouth daily. 90 tablet 4   No current facility-administered medications on file prior to  visit.      Objective:  Objective  Physical Exam Vitals signs and nursing note reviewed.  Constitutional:      Appearance: She is well-developed.  HENT:     Head: Normocephalic and atraumatic.  Eyes:     Conjunctiva/sclera: Conjunctivae normal.  Neck:     Musculoskeletal: Normal range of motion and neck supple.     Thyroid: No thyromegaly.     Vascular: No carotid bruit or JVD.  Cardiovascular:     Rate and Rhythm: Normal rate and regular rhythm.     Heart sounds: Normal heart sounds. No murmur.  Pulmonary:     Effort: Pulmonary effort is normal. No respiratory distress.     Breath sounds: Normal breath sounds. No wheezing or rales.  Chest:     Chest wall: No tenderness.  Neurological:     Mental Status: She is alert and oriented to person, place, and time.    BP 116/61 (BP Location: Left Arm, Patient Position: Sitting, Cuff Size: Normal)    Pulse 87    Temp (!) 97.3 F (36.3 C) (Temporal)    Resp 18    Ht '5\' 5"'  (1.651 m)    SpO2 99%    BMI 25.09 kg/m  Wt Readings from Last 3 Encounters:  05/24/18 150 lb 12.8 oz (68.4 kg)  05/18/18 149 lb (67.6 kg)  05/18/18 149 lb 9.6 oz (67.9 kg)     Lab Results  Component Value Date   WBC 4.7 08/10/2018   HGB 11.7 (L) 08/10/2018   HCT 35.9 (L) 08/10/2018   PLT 116 (L) 08/10/2018    GLUCOSE 87 08/10/2018   CHOL 115 05/24/2018   TRIG 68 05/24/2018   HDL 36 (L) 05/24/2018   LDLDIRECT 160.2 09/23/2010   LDLCALC 65 05/24/2018   ALT 24 08/10/2018   AST 53 (H) 08/10/2018   NA 140 08/10/2018   K 3.9 08/10/2018   CL 108 08/10/2018   CREATININE 0.57 08/10/2018   BUN 7 (L) 08/10/2018   CO2 20 (L) 08/10/2018   TSH 0.581 08/10/2018   INR 1.5 (H) 12/21/2017   HGBA1C <4.2 (L) 05/17/2018   MICROALBUR 3.7 (H) 10/26/2015    Ct Chest W Contrast  Result Date: 08/10/2018 CLINICAL DATA:  Restaging of breast cancer and ovarian cancer. EXAM: CT CHEST, ABDOMEN, AND PELVIS WITH CONTRAST TECHNIQUE: Multidetector CT imaging of the chest, abdomen and pelvis was performed following the standard protocol during bolus administration of intravenous contrast. CONTRAST:  12m OMNIPAQUE IOHEXOL 300 MG/ML  SOLN COMPARISON:  Multiple exams, including 04/02/2018 PET-CT FINDINGS: CT CHEST FINDINGS Cardiovascular: Left Port-A-Cath tip: Distal SVC. Atherosclerotic calcification of the aortic arch and coronary arteries with mild cardiomegaly. Distal paraesophageal uphill varices. Mediastinum/Nodes: Scattered mediastinal nodes are present. A prevascular node measures 0.9 cm in short axis on image 17/2, formerly by my measurements the same on the PET-CT from 04/02/2018. Right lower paratracheal node 1.1 cm in short axis on image 18/2, formerly 1.2 cm. Subcarinal node 1.8 cm in short axis on image 24/2, previously the same by my measurements. Upper normal sized bilateral hilar and infrahilar lymph nodes are present. Small left axillary and left subpectoral lymph nodes are observed, slightly reduced in prominence, with a left axillary lymph node on image 11/2 measuring 0.5 cm in short axis, previously 0.7 cm. Lungs/Pleura: Small persistent right pleural effusion. Peripheral interstitial accentuation favoring the upper lobes. Confluent scarring at the right lung apex. Possible radiation therapy related findings  anteriorly in the right middle  lobe and right lower lobe. Faint interstitial accentuation at the lung bases, subtle interstitial edema is not excluded. 5 mm right middle lobe pulmonary nodule on image 82/6, stable. This is just below the minor fissure. Musculoskeletal: Right mastectomy. CT ABDOMEN PELVIS FINDINGS Hepatobiliary: Nodular hepatic contour with morphologic findings characteristic of hepatic cirrhosis. Cholecystectomy. No significant degree of biliary dilatation. No significant focal hepatic lesion. Pancreas: Unremarkable Spleen: Splenomegaly, spleen measures 16.8 by 7.9 by 14.6 cm (volume = 1000 cm^3). Adrenals/Urinary Tract: Both adrenal glands appear normal. Borderline urinary bladder wall thickening diffusely. Left mid kidney cyst anteriorly. 5 by 2 mm right mid kidney nonobstructive renal calculus on image 106/4. Stomach/Bowel: Unremarkable Vascular/Lymphatic: Splenorenal shunting. The portal vein appears patent but with some peripheral calcification along its anterior margin. Aortoiliac atherosclerotic vascular disease. Left periaortic lymph node 3.1 cm in short axis on image 70/2, formerly 2.4 cm. Stable reactive porta hepatis lymph nodes. Aortoiliac atherosclerotic vascular disease. Reproductive: Uterus absent.  Adnexa unremarkable. Other: Mild upper abdominal ascites. Subtle infiltration of the omentum and mesentery likely from edema, less likely to be from peritoneal metastatic deposits. Trace pelvic ascites. Stable mild edema in the perirectal space. Musculoskeletal: Prominent left and mild right degenerative hip arthropathy. Spondylosis and degenerative disc disease at L5-S1. IMPRESSION: 1. The dominant left periaortic lymph node has significantly increased in size, currently 3.1 cm in short axis, formerly 2.4 cm, compatible with malignancy. 2. There is notable hepatic cirrhosis with splenorenal shunting and uphill paraesophageal varices as well as some reactive lymph nodes along the porta  hepatis. 3. Mildly prominent mediastinal lymph nodes are roughly stable compared to the prior exam. 4. Other imaging findings of potential clinical significance: Aortic Atherosclerosis (ICD10-I70.0). Coronary atherosclerosis. Small persistent right pleural effusion. Cardiomegaly with interstitial accentuation in the lungs which may be from mild interstitial edema. Stable splenomegaly. Nonobstructive right nephrolithiasis. Mild ascites. Prominent left hip arthropathy. Stable 5 mm in diameter right middle lobe pulmonary nodule. Electronically Signed   By: Van Clines M.D.   On: 08/10/2018 13:51   Ct Abdomen Pelvis W Contrast  Result Date: 08/10/2018 CLINICAL DATA:  Restaging of breast cancer and ovarian cancer. EXAM: CT CHEST, ABDOMEN, AND PELVIS WITH CONTRAST TECHNIQUE: Multidetector CT imaging of the chest, abdomen and pelvis was performed following the standard protocol during bolus administration of intravenous contrast. CONTRAST:  136m OMNIPAQUE IOHEXOL 300 MG/ML  SOLN COMPARISON:  Multiple exams, including 04/02/2018 PET-CT FINDINGS: CT CHEST FINDINGS Cardiovascular: Left Port-A-Cath tip: Distal SVC. Atherosclerotic calcification of the aortic arch and coronary arteries with mild cardiomegaly. Distal paraesophageal uphill varices. Mediastinum/Nodes: Scattered mediastinal nodes are present. A prevascular node measures 0.9 cm in short axis on image 17/2, formerly by my measurements the same on the PET-CT from 04/02/2018. Right lower paratracheal node 1.1 cm in short axis on image 18/2, formerly 1.2 cm. Subcarinal node 1.8 cm in short axis on image 24/2, previously the same by my measurements. Upper normal sized bilateral hilar and infrahilar lymph nodes are present. Small left axillary and left subpectoral lymph nodes are observed, slightly reduced in prominence, with a left axillary lymph node on image 11/2 measuring 0.5 cm in short axis, previously 0.7 cm. Lungs/Pleura: Small persistent right pleural  effusion. Peripheral interstitial accentuation favoring the upper lobes. Confluent scarring at the right lung apex. Possible radiation therapy related findings anteriorly in the right middle lobe and right lower lobe. Faint interstitial accentuation at the lung bases, subtle interstitial edema is not excluded. 5 mm right middle lobe pulmonary nodule on  image 82/6, stable. This is just below the minor fissure. Musculoskeletal: Right mastectomy. CT ABDOMEN PELVIS FINDINGS Hepatobiliary: Nodular hepatic contour with morphologic findings characteristic of hepatic cirrhosis. Cholecystectomy. No significant degree of biliary dilatation. No significant focal hepatic lesion. Pancreas: Unremarkable Spleen: Splenomegaly, spleen measures 16.8 by 7.9 by 14.6 cm (volume = 1000 cm^3). Adrenals/Urinary Tract: Both adrenal glands appear normal. Borderline urinary bladder wall thickening diffusely. Left mid kidney cyst anteriorly. 5 by 2 mm right mid kidney nonobstructive renal calculus on image 106/4. Stomach/Bowel: Unremarkable Vascular/Lymphatic: Splenorenal shunting. The portal vein appears patent but with some peripheral calcification along its anterior margin. Aortoiliac atherosclerotic vascular disease. Left periaortic lymph node 3.1 cm in short axis on image 70/2, formerly 2.4 cm. Stable reactive porta hepatis lymph nodes. Aortoiliac atherosclerotic vascular disease. Reproductive: Uterus absent.  Adnexa unremarkable. Other: Mild upper abdominal ascites. Subtle infiltration of the omentum and mesentery likely from edema, less likely to be from peritoneal metastatic deposits. Trace pelvic ascites. Stable mild edema in the perirectal space. Musculoskeletal: Prominent left and mild right degenerative hip arthropathy. Spondylosis and degenerative disc disease at L5-S1. IMPRESSION: 1. The dominant left periaortic lymph node has significantly increased in size, currently 3.1 cm in short axis, formerly 2.4 cm, compatible with  malignancy. 2. There is notable hepatic cirrhosis with splenorenal shunting and uphill paraesophageal varices as well as some reactive lymph nodes along the porta hepatis. 3. Mildly prominent mediastinal lymph nodes are roughly stable compared to the prior exam. 4. Other imaging findings of potential clinical significance: Aortic Atherosclerosis (ICD10-I70.0). Coronary atherosclerosis. Small persistent right pleural effusion. Cardiomegaly with interstitial accentuation in the lungs which may be from mild interstitial edema. Stable splenomegaly. Nonobstructive right nephrolithiasis. Mild ascites. Prominent left hip arthropathy. Stable 5 mm in diameter right middle lobe pulmonary nodule. Electronically Signed   By: Van Clines M.D.   On: 08/10/2018 13:51     Assessment & Plan:  Plan  I am having Patsy Zaragoza. Emmons start on cephALEXin. I am also having her maintain her famotidine, acetaminophen, metroNIDAZOLE, ketoconazole, nadolol, ondansetron, glimepiride, omeprazole, lidocaine-prilocaine, oxybutynin, nystatin, OneTouch Verio, clotrimazole, levothyroxine, metFORMIN, NONFORMULARY OR COMPOUNDED ITEM, diazepam, LORazepam, escitalopram, pravastatin, furosemide, propranolol, OLANZapine, Toremifene Citrate, letrozole, OneTouch Verio, OneTouch Delica Plus [id number], ALPRAZolam, palbociclib, potassium chloride, and buPROPion.  Meds ordered this encounter  Medications   cephALEXin (KEFLEX) 500 MG capsule    Sig: Take 1 capsule (500 mg total) by mouth 4 (four) times daily.    Dispense:  14 capsule    Refill:  0    Problem List Items Addressed This Visit    None    Visit Diagnoses    Dysuria    -  Primary   Relevant Medications   cephALEXin (KEFLEX) 500 MG capsule   Other Relevant Orders   POCT Urinalysis Dipstick (Automated) (Completed)   Urinary frequency       Relevant Medications   cephALEXin (KEFLEX) 500 MG capsule   Other Relevant Orders   Ambulatory referral to Urology      Follow-up:  Return if symptoms worsen or fail to improve.  Ann Held, DO

## 2018-12-28 ENCOUNTER — Telehealth: Payer: Self-pay
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
## 2018-12-28 NOTE — Telephone Encounter (Signed)
Pt's husband was inquiring about ensure coupons and those have been sent to him.

## 2018-12-28 NOTE — Telephone Encounter (Signed)
I believe patient is wanting AWV

## 2018-12-29 ENCOUNTER — Telehealth: Payer: Self-pay | Admitting: Internal Medicine
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
## 2018-12-30 ENCOUNTER — Other Ambulatory Visit: Payer: Self-pay

## 2018-12-30 ENCOUNTER — Inpatient Hospital Stay: Payer: Medicare Other

## 2018-12-30 ENCOUNTER — Inpatient Hospital Stay: Payer: Medicare Other | Attending: Oncology | Admitting: Oncology

## 2018-12-30 VITALS — BP 104/62 | HR 63 | Temp 98.9°F | Resp 18 | Ht 65.0 in

## 2018-12-30 DIAGNOSIS — Z95828 Presence of other vascular implants and grafts: Secondary | ICD-10-CM

## 2018-12-30 DIAGNOSIS — C801 Malignant (primary) neoplasm, unspecified: Secondary | ICD-10-CM

## 2018-12-30 DIAGNOSIS — E039 Hypothyroidism, unspecified: Secondary | ICD-10-CM | POA: Insufficient documentation

## 2018-12-30 DIAGNOSIS — C772 Secondary and unspecified malignant neoplasm of intra-abdominal lymph nodes: Secondary | ICD-10-CM | POA: Diagnosis not present

## 2018-12-30 DIAGNOSIS — G63 Polyneuropathy in diseases classified elsewhere: Secondary | ICD-10-CM

## 2018-12-30 DIAGNOSIS — E119 Type 2 diabetes mellitus without complications: Secondary | ICD-10-CM | POA: Insufficient documentation

## 2018-12-30 DIAGNOSIS — C50011 Malignant neoplasm of nipple and areola, right female breast: Secondary | ICD-10-CM

## 2018-12-30 DIAGNOSIS — D6959 Other secondary thrombocytopenia: Secondary | ICD-10-CM | POA: Insufficient documentation

## 2018-12-30 DIAGNOSIS — Z87891 Personal history of nicotine dependence: Secondary | ICD-10-CM | POA: Insufficient documentation

## 2018-12-30 DIAGNOSIS — M818 Other osteoporosis without current pathological fracture: Secondary | ICD-10-CM

## 2018-12-30 DIAGNOSIS — M8580 Other specified disorders of bone density and structure, unspecified site: Secondary | ICD-10-CM | POA: Insufficient documentation

## 2018-12-30 DIAGNOSIS — Z7984 Long term (current) use of oral hypoglycemic drugs: Secondary | ICD-10-CM | POA: Diagnosis not present

## 2018-12-30 DIAGNOSIS — E785 Hyperlipidemia, unspecified: Secondary | ICD-10-CM | POA: Diagnosis not present

## 2018-12-30 DIAGNOSIS — Z9011 Acquired absence of right breast and nipple: Secondary | ICD-10-CM | POA: Diagnosis not present

## 2018-12-30 DIAGNOSIS — Z170 Estrogen receptor positive status [ER+]: Secondary | ICD-10-CM

## 2018-12-30 DIAGNOSIS — F329 Major depressive disorder, single episode, unspecified: Secondary | ICD-10-CM | POA: Insufficient documentation

## 2018-12-30 DIAGNOSIS — R918 Other nonspecific abnormal finding of lung field: Secondary | ICD-10-CM | POA: Diagnosis not present

## 2018-12-30 DIAGNOSIS — C569 Malignant neoplasm of unspecified ovary: Secondary | ICD-10-CM

## 2018-12-30 DIAGNOSIS — G4733 Obstructive sleep apnea (adult) (pediatric): Secondary | ICD-10-CM | POA: Diagnosis not present

## 2018-12-30 DIAGNOSIS — Z90710 Acquired absence of both cervix and uterus: Secondary | ICD-10-CM | POA: Insufficient documentation

## 2018-12-30 DIAGNOSIS — Z452 Encounter for adjustment and management of vascular access device: Secondary | ICD-10-CM | POA: Insufficient documentation

## 2018-12-30 DIAGNOSIS — Z923 Personal history of irradiation: Secondary | ICD-10-CM | POA: Insufficient documentation

## 2018-12-30 DIAGNOSIS — Z79899 Other long term (current) drug therapy: Secondary | ICD-10-CM | POA: Diagnosis not present

## 2018-12-30 DIAGNOSIS — C562 Malignant neoplasm of left ovary: Secondary | ICD-10-CM

## 2018-12-30 DIAGNOSIS — C561 Malignant neoplasm of right ovary: Secondary | ICD-10-CM

## 2018-12-30 DIAGNOSIS — Z86718 Personal history of other venous thrombosis and embolism: Secondary | ICD-10-CM | POA: Diagnosis not present

## 2018-12-30 DIAGNOSIS — Z7901 Long term (current) use of anticoagulants: Secondary | ICD-10-CM | POA: Insufficient documentation

## 2018-12-30 DIAGNOSIS — C50811 Malignant neoplasm of overlapping sites of right female breast: Secondary | ICD-10-CM

## 2018-12-30 LAB — COMPREHENSIVE METABOLIC PANEL
ALT: 12 U/L (ref 0–44)
AST: 34 U/L (ref 15–41)
Albumin: 3.1 g/dL — ABNORMAL LOW (ref 3.5–5.0)
Alkaline Phosphatase: 86 U/L (ref 38–126)
Anion gap: 10 (ref 5–15)
BUN: 9 mg/dL (ref 8–23)
CO2: 20 mmol/L — ABNORMAL LOW (ref 22–32)
Calcium: 8.2 mg/dL — ABNORMAL LOW (ref 8.9–10.3)
Chloride: 109 mmol/L (ref 98–111)
Creatinine, Ser: 0.64 mg/dL (ref 0.44–1.00)
GFR calc Af Amer: >60 mL/min (ref >60)
GFR calc non Af Amer: >60 mL/min (ref >60)
Glucose, Bld: 74 mg/dL (ref 70–99)
Potassium: 3.8 mmol/L (ref 3.5–5.1)
Sodium: 139 mmol/L (ref 135–145)
Total Bilirubin: 2.5 mg/dL — ABNORMAL HIGH (ref 0.3–1.2)
Total Protein: 6.9 g/dL (ref 6.5–8.1)

## 2018-12-30 LAB — CBC WITH DIFFERENTIAL/PLATELET
Abs Immature Granulocytes: 0.01 10*3/uL (ref 0.00–0.07)
Basophils Absolute: 0 10*3/uL (ref 0.0–0.1)
Basophils Relative: 0 %
Eosinophils Absolute: 0.1 10*3/uL (ref 0.0–0.5)
Eosinophils Relative: 2 %
HCT: 30.9 % — ABNORMAL LOW (ref 36.0–46.0)
Hemoglobin: 10.6 g/dL — ABNORMAL LOW (ref 12.0–15.0)
Immature Granulocytes: 0 %
Lymphocytes Relative: 19 %
Lymphs Abs: 0.6 10*3/uL — ABNORMAL LOW (ref 0.7–4.0)
MCH: 39.4 pg — ABNORMAL HIGH (ref 26.0–34.0)
MCHC: 34.3 g/dL (ref 30.0–36.0)
MCV: 114.9 fL — ABNORMAL HIGH (ref 80.0–100.0)
Monocytes Absolute: 0.1 10*3/uL (ref 0.1–1.0)
Monocytes Relative: 3 %
Neutro Abs: 2.2 10*3/uL (ref 1.7–7.7)
Neutrophils Relative %: 76 %
Platelets: 50 10*3/uL — ABNORMAL LOW (ref 150–400)
RBC: 2.69 MIL/uL — ABNORMAL LOW (ref 3.87–5.11)
RDW: 16.6 % — ABNORMAL HIGH (ref 11.5–15.5)
WBC: 3 10*3/uL — ABNORMAL LOW (ref 4.0–10.5)
nRBC: 0 % (ref 0.0–0.2)

## 2018-12-30 MED ORDER — SODIUM CHLORIDE 0.9% FLUSH
10.0000 mL | Freq: Once | INTRAVENOUS | Status: AC
  Administered 2018-12-30: 10 mL
  Filled 2018-12-30: qty 10

## 2018-12-30 MED ORDER — HEPARIN SOD (PORK) LOCK FLUSH 100 UNIT/ML IV SOLN
500.0000 [IU] | Freq: Once | INTRAVENOUS | Status: AC
  Administered 2018-12-30: 500 [IU]
  Filled 2018-12-30: qty 5
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
## 2018-12-31 ENCOUNTER — Other Ambulatory Visit: Payer: Self-pay | Admitting: Oncology

## 2018-12-31 LAB — CA 125: Cancer Antigen (CA) 125: 91.7 U/mL — ABNORMAL HIGH (ref 0.0–38.1)

## 2018-12-31 LAB — CANCER ANTIGEN 27.29: CA 27.29: 60.1 U/mL — ABNORMAL HIGH (ref 0.0–38.6)

## 2018-12-31 NOTE — Addendum Note (Signed)
Addended by: Chauncey Cruel on: 12/31/2018 08:44 AM   Modules accepted: Orders
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
## 2019-01-03 ENCOUNTER — Other Ambulatory Visit: Payer: Self-pay | Admitting: *Deleted

## 2019-01-03 DIAGNOSIS — Z170 Estrogen receptor positive status [ER+]: Secondary | ICD-10-CM

## 2019-01-03 DIAGNOSIS — C562 Malignant neoplasm of left ovary: Secondary | ICD-10-CM

## 2019-01-03 DIAGNOSIS — C50811 Malignant neoplasm of overlapping sites of right female breast: Secondary | ICD-10-CM

## 2019-01-03 MED ORDER — ONDANSETRON HCL 8 MG PO TABS: 8.0000 mg | ORAL_TABLET | Freq: Three times a day (TID) | ORAL | 0 refills | Status: DC | PRN

## 2019-01-05 ENCOUNTER — Telehealth: Payer: Self-pay | Admitting: Internal Medicine

## 2019-01-05 NOTE — Telephone Encounter (Signed)
Called [employer] & left vm for Dianna letting her know we do not do any of the billing or filing of insurance for cpaps.  She will need to contact pt's DME company.  Left her my phone # if she has any other questions.  Nothing further needed.

## 2019-01-06 ENCOUNTER — Telehealth: Payer: Self-pay | Admitting: Internal Medicine

## 2019-01-06 MED FILL — IBRANCE 100 MG TABS: 100 | 28 days supply | Qty: 21 | Fill #2
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
## 2019-01-06 NOTE — Telephone Encounter (Signed)
Noted. Will forward to CY as FYI.

## 2019-01-07 ENCOUNTER — Other Ambulatory Visit: Payer: Self-pay | Admitting: Oncology

## 2019-01-10 ENCOUNTER — Other Ambulatory Visit: Payer: Self-pay

## 2019-01-10 ENCOUNTER — Telehealth: Payer: Self-pay | Admitting: *Deleted

## 2019-01-10 ENCOUNTER — Other Ambulatory Visit: Payer: Self-pay | Admitting: *Deleted

## 2019-01-10 ENCOUNTER — Inpatient Hospital Stay: Payer: Medicare Other

## 2019-01-10 DIAGNOSIS — C50011 Malignant neoplasm of nipple and areola, right female breast: Secondary | ICD-10-CM

## 2019-01-10 DIAGNOSIS — R197 Diarrhea, unspecified: Secondary | ICD-10-CM

## 2019-01-10 DIAGNOSIS — C569 Malignant neoplasm of unspecified ovary: Secondary | ICD-10-CM

## 2019-01-10 MED ORDER — CHOLESTYRAMINE 4 G PO PACK: 4.0000 g | PACK | Freq: Two times a day (BID) | ORAL | 0 refills | Status: DC

## 2019-01-10 MED ORDER — METRONIDAZOLE 500 MG PO TABS: 500.0000 mg | ORAL_TABLET | Freq: Three times a day (TID) | ORAL | 0 refills | Status: DC

## 2019-01-10 NOTE — Telephone Encounter (Signed)
Pt came in to lab for collection of stool for C Diff.  Post 1 hour patient unable to provide specimen. Kit given to pt and husband with instructions for collection at home and pt left via wheel chair with husband.  This RN was called back promptly to lab post pt has large loosen watery  stool upon getting into her car.  Note pt was in wheelchair with stool on inner thighs down to her knees.  Pt was taken to private bathroom in lab.  Pt's undergarments  including adult diaper were removed. with noted heaviness per wetness as well as stool on top layer. This RN tended to care of pt then collected what was available from under pad into container - note - due to diaper collecting wetness - stool collected was soft not watery.  Pt taken to car in wheelchair and assisted into car with husband attending.  Bathroom and wheelchair cleaned per nurse and enviromental services called for protocol cleaning.  Stool was rejected by central lab due to consistency.  MD made aware of above- including description of stool.  Orders given for Flagyl and Questran for diarrhea.

## 2019-01-10 NOTE — Telephone Encounter (Signed)
This RN spoke with [name] per his VM stating " call me I got a real problem with [name] ".  [name] states onset of diarrhea approximately 3 days ago - with description of large, frequent watery stools with incontinence.  [name] has hx of intermittent mild diarrhea usually controlled with imodium AD - pt used over the weekend with little benefit.  Of note pt was started on abx ( keflex ) per primary MD for UTI 2 weeks ago.  Per above - and review with MD- request for pt to come in for a stool collection for possible C Diff.  [name] informed and understood plan- appointment made as well as obtaining clearance for husband to accompany wife per physical need.

## 2019-01-11 ENCOUNTER — Telehealth: Payer: Self-pay | Admitting: Internal Medicine

## 2019-01-11 ENCOUNTER — Other Ambulatory Visit: Payer: Self-pay | Admitting: Family Medicine

## 2019-01-11 ENCOUNTER — Telehealth: Payer: Self-pay | Admitting: *Deleted

## 2019-01-11 DIAGNOSIS — E1151 Type 2 diabetes mellitus with diabetic peripheral angiopathy without gangrene: Secondary | ICD-10-CM

## 2019-01-11 DIAGNOSIS — G4733 Obstructive sleep apnea (adult) (pediatric): Secondary | ICD-10-CM

## 2019-01-11 DIAGNOSIS — IMO0002 Reserved for concepts with insufficient information to code with codable children: Secondary | ICD-10-CM

## 2019-01-11 NOTE — Telephone Encounter (Signed)
This RN spoke with pt's husband, [name] , to follow up on diarrhea.  [name] stated Polixeni continued with diarrhea " all night " - he picked up prescriptions this AM and " she's like 100 % better ".  This RN informed [name] need to continue medication Flagyl despite improvement of diarrhea.  His inquiry regarding concern for need for increased appetite and weight gain will be further discussed post completion of current therapy.  No further needs at this time.
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
## 2019-01-12 ENCOUNTER — Ambulatory Visit (INDEPENDENT_AMBULATORY_CARE_PROVIDER_SITE_OTHER): Payer: Medicare Other | Admitting: Family Medicine

## 2019-01-12 ENCOUNTER — Other Ambulatory Visit: Payer: Self-pay

## 2019-01-12 ENCOUNTER — Encounter: Payer: Self-pay | Admitting: Family Medicine

## 2019-01-12 ENCOUNTER — Other Ambulatory Visit: Payer: Medicare Other

## 2019-01-12 ENCOUNTER — Telehealth: Payer: Self-pay

## 2019-01-12 DIAGNOSIS — K7460 Unspecified cirrhosis of liver: Secondary | ICD-10-CM

## 2019-01-12 DIAGNOSIS — R197 Diarrhea, unspecified: Secondary | ICD-10-CM | POA: Diagnosis not present

## 2019-01-12 DIAGNOSIS — E1165 Type 2 diabetes mellitus with hyperglycemia: Secondary | ICD-10-CM

## 2019-01-12 LAB — LIPID PANEL
Cholesterol: 96 mg/dL (ref 0–200)
HDL: 23.3 mg/dL — ABNORMAL LOW (ref >39.00)
LDL Cholesterol: 59 mg/dL (ref 0–99)
NonHDL: 72.26
Total CHOL/HDL Ratio: 4
Triglycerides: 65 mg/dL (ref 0.0–149.0)
VLDL: 13 mg/dL (ref 0.0–40.0)

## 2019-01-12 LAB — COMPREHENSIVE METABOLIC PANEL
ALT: 8 U/L (ref 0–35)
AST: 24 U/L (ref 0–37)
Albumin: 3.1 g/dL — ABNORMAL LOW (ref 3.5–5.2)
Alkaline Phosphatase: 81 U/L (ref 39–117)
BUN: 6 mg/dL (ref 6–23)
CO2: 23 mEq/L (ref 19–32)
Calcium: 8.1 mg/dL — ABNORMAL LOW (ref 8.4–10.5)
Chloride: 108 mEq/L (ref 96–112)
Creatinine, Ser: 0.54 mg/dL (ref 0.40–1.20)
GFR: 113.35 mL/min (ref >60.00)
Glucose, Bld: 105 mg/dL — ABNORMAL HIGH (ref 70–99)
Potassium: 3.3 mEq/L — ABNORMAL LOW (ref 3.5–5.1)
Sodium: 138 mEq/L (ref 135–145)
Total Bilirubin: 1.6 mg/dL — ABNORMAL HIGH (ref 0.2–1.2)
Total Protein: 6.4 g/dL (ref 6.0–8.3)

## 2019-01-12 LAB — HEMOGLOBIN A1C: Hgb A1c MFr Bld: 4.5 % — ABNORMAL LOW (ref 4.6–6.5)

## 2019-01-12 MED ORDER — ONETOUCH VERIO VI STRP: ORAL_STRIP | 1 refills | Status: AC

## 2019-01-12 MED ORDER — ONETOUCH VERIO W/DEVICE KIT: PACK | 0 refills | Status: DC

## 2019-01-12 MED ORDER — BLOOD GLUCOSE MONITOR KIT: PACK | 0 refills | Status: DC

## 2019-01-12 MED ORDER — ONETOUCH DELICA LANCETS 33G MISC: 1 refills | Status: DC

## 2019-01-12 NOTE — Telephone Encounter (Signed)
Pt's husband returning call.  [phone].

## 2019-01-12 NOTE — Addendum Note (Signed)
Addended by: Kem Boroughs D on: 01/12/2019 01:23 PM   Modules accepted: Orders
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
## 2019-01-12 NOTE — Addendum Note (Signed)
Addended by: Kelle Darting A on: 01/12/2019 01:08 PM   Modules accepted: Orders
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
## 2019-01-12 NOTE — Telephone Encounter (Signed)
Called and spoke to patient's husband.  He wanted to make sure that if he doesn't answer that we leave a detailed message on his voicemail.  He was very appreciative that we are working to get the patient her CPAP.

## 2019-01-12 NOTE — Telephone Encounter (Signed)
I have faxed everything that we have in Epic. I will order the paper chart to get the notes before the sleep study done 01/2007

## 2019-01-12 NOTE — Telephone Encounter (Signed)
-----   Message from Roetta Sessions, [place] sent at 09/16/2018  5:18 PM EDT ----- Regarding: RUQ ultrasound due in Sept Pt due for RUQ ultrasound in Sept 2020

## 2019-01-12 NOTE — Telephone Encounter (Signed)
Scheduled pt for RUQ Ultrasound on Wednesday, 01-26-19 at 9:00am to arrive at 8:45am NPO after midnight.  Letter sent to pt with number to reschedule if needed.

## 2019-01-12 NOTE — Telephone Encounter (Signed)
[place] returning phone call.  [name] phone number is [phone].

## 2019-01-14 ENCOUNTER — Telehealth: Payer: Self-pay

## 2019-01-14 ENCOUNTER — Other Ambulatory Visit: Payer: Self-pay

## 2019-01-14 MED ORDER — POTASSIUM CHLORIDE CRYS ER 20 MEQ PO TBCR: 20.0000 meq | EXTENDED_RELEASE_TABLET | Freq: Every day | ORAL | 3 refills | Status: DC

## 2019-01-14 NOTE — Telephone Encounter (Signed)
Pt's spouse called to ask if there is anything else that can be done for her diarrhea.   RN confirmed that pt is taking questran BID.  [name] says [name] was working until last night.  [name] ate a hotdog for dinner and has diarrhea ever since then.  Pt does not report any issues with taking in food or liquid and keeping it down. RN offered for pt to come in to [facility] but pt declines. RN advised pt to drink pedialyte and to eat bland foods.  No processed, fried or spicy foods. Pt and spouse verbalize understanding.

## 2019-01-18 ENCOUNTER — Other Ambulatory Visit: Payer: Self-pay

## 2019-01-18 MED ORDER — ALPRAZOLAM 0.5 MG PO TABS: 0.5000 mg | ORAL_TABLET | Freq: Two times a day (BID) | ORAL | 0 refills | Status: DC | PRN
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
## 2019-01-19 ENCOUNTER — Telehealth: Payer: Self-pay | Admitting: Internal Medicine

## 2019-01-19 NOTE — Telephone Encounter (Signed)
ATC pt spouse (on DPR), line went to voicemail. I have left a detailed message for pt spouse regarding Anita's documentation below. I let him know to give our office a call back if he has any further questions or concerns. Nothing further needed at this time. Will sign off.

## 2019-01-19 NOTE — Telephone Encounter (Signed)
LMTCB with patient. Please inquire as to whether they want the cpap machine or did they purchase one from somewhere else.

## 2019-01-20 NOTE — Telephone Encounter (Signed)
I just confirmed with [name] from [facility] that the patient is being setup with Cpap 01/21/19 but I am still waiting for paper chart to send notes before the sleep study to [facility]

## 2019-01-20 NOTE — Telephone Encounter (Signed)
LMTCB x2 for pt 

## 2019-01-22 ENCOUNTER — Other Ambulatory Visit: Payer: Self-pay | Admitting: Family Medicine

## 2019-01-25 NOTE — Telephone Encounter (Signed)
Nope not yet and I have ask for it a 2nd time on 01/20/19

## 2019-01-25 NOTE — Telephone Encounter (Signed)
[name] - please advise if we have received the pt's paper chart. Thanks.

## 2019-01-26 ENCOUNTER — Telehealth: Payer: Self-pay

## 2019-01-26 ENCOUNTER — Other Ambulatory Visit: Payer: Self-pay

## 2019-01-26 ENCOUNTER — Ambulatory Visit (HOSPITAL_COMMUNITY)
Admission: RE | Admit: 2019-01-26 | Discharge: 2019-01-26 | Disposition: A | Discharge status: Home / Self Care | Payer: Medicare Other | Source: Ambulatory Visit | Attending: Gastroenterology | Admitting: Gastroenterology

## 2019-01-26 DIAGNOSIS — K7460 Unspecified cirrhosis of liver: Secondary | ICD-10-CM | POA: Diagnosis not present

## 2019-01-26 NOTE — Telephone Encounter (Signed)
I finally got the paper chart but it was the wrong volume. I was able to find the note before the sleep study and faxed it to [place]: Shanon Brow. I received confirmation that the fax was received

## 2019-01-26 NOTE — Telephone Encounter (Signed)
Pt scheduled for appt on 03-08-19 with [facility].

## 2019-01-26 NOTE — Telephone Encounter (Signed)
-----   Message from Yetta Flock, MD sent at 01/26/2019  3:19 PM EDT ----- [name] can you please place a recall for [name] 6 months and have the front desk schedule her a follow up appointment. thanks  Results relayed to patient via the Mychart message as outlined below:   Hello Ms. Lafferty,  This message is to relay the results of your recent ultrasound. There is stable changes of cirrhosis of the liver, with no new findings, which is good news. We will continue to survey your liver every 6 months with imaging to monitor this.  It has been about a year since your last clinic visit with [name]. Please call to schedule at your convenience so we can continue to monitor your liver disease. Please let me know if you have any questions otherwise.  Dr. Havery Moros

## 2019-02-03 MED FILL — TOREMIFENE CITRATE 60 MG TA: 60 | 90 days supply | Qty: 90 | Fill #2

## 2019-02-03 MED FILL — IBRANCE 100 MG TABS: 100 | 28 days supply | Qty: 21 | Fill #3

## 2019-02-11 ENCOUNTER — Ambulatory Visit (INDEPENDENT_AMBULATORY_CARE_PROVIDER_SITE_OTHER): Payer: Medicare Other

## 2019-02-11 ENCOUNTER — Other Ambulatory Visit: Payer: Self-pay

## 2019-02-11 DIAGNOSIS — Z23 Encounter for immunization: Secondary | ICD-10-CM

## 2019-02-25 ENCOUNTER — Other Ambulatory Visit: Payer: Self-pay | Admitting: Oncology

## 2019-03-03 MED FILL — IBRANCE 100 MG TABS: 100 | 28 days supply | Qty: 21 | Fill #4

## 2019-03-08 ENCOUNTER — Other Ambulatory Visit: Payer: Self-pay

## 2019-03-08 ENCOUNTER — Encounter: Payer: Self-pay | Admitting: Gastroenterology

## 2019-03-08 ENCOUNTER — Telehealth: Payer: Self-pay

## 2019-03-08 ENCOUNTER — Ambulatory Visit (INDEPENDENT_AMBULATORY_CARE_PROVIDER_SITE_OTHER): Payer: Medicare Other | Admitting: Gastroenterology

## 2019-03-08 VITALS — BP 120/58 | HR 64 | Temp 98.1°F | Ht 65.0 in | Wt 145.0 lb

## 2019-03-08 DIAGNOSIS — K7460 Unspecified cirrhosis of liver: Secondary | ICD-10-CM

## 2019-03-08 DIAGNOSIS — I8500 Esophageal varices without bleeding: Secondary | ICD-10-CM | POA: Diagnosis not present

## 2019-03-08 DIAGNOSIS — R630 Anorexia: Secondary | ICD-10-CM

## 2019-03-08 DIAGNOSIS — C569 Malignant neoplasm of unspecified ovary: Secondary | ICD-10-CM

## 2019-03-08 NOTE — Telephone Encounter (Signed)
Called and Lm for nurse of Dr. Virgie Dad office that pt was seen this morning by Dr. Havery Moros. He would like her to have a CBC w/ Diff, CMET, PT/INR and AFP however The patient indicated she will be having labs done for Dr. Jana Hakim in the next couple of months and requested that she have them all done at his office.  Requested the nurse to let me know how to get those added to be drawn.

## 2019-03-08 NOTE — Progress Notes (Signed)
HPI :  63 year old female here for a follow-up visit. She has a history of cirrhosis,breast cancer / ovarian cancer.   History of fatty liver and she underwent a liver biopsy in 2010 which showed cirrhosis - description shows steatosis and I suspect NASH, although ANA was markedly positive back then but histology notconsistent withautoimmune hepatitis. Smooth muscle antibody is negative as well as her immunoglobulin level.She was vaccinated for hepatitis A and B previously. She has been treated with propranolol (switched from metoprolol) so she does not undergo screening for varices. She has declined EGD in the past, as well as colonoscopy for screening purposes, she does not wish to have any colonoscopy exams.   She is currently taking toremifene and ibrance for history of ovarian cancer.  She has a follow-up PET scan which is scheduled for November with Dr. Jana Hakim.  She has not had any hepatic decompensations since of last seen her.  She states she is feeling okay, she is bound to wheelchair for ambulation due to chronic knee issues.  She is tolerating the propranolol well.  She is not had any jaundice.  Denies any problems with ascites.  No issues with encephalopathy.  She continues to have a poor appetite which has been stable for a long time.  She feels full easily when she eats and generally just does not want to eat.  She has about 1-2 meals a day.  She denies any nausea or vomiting.  She states her weight has fluctuated recently, previously she had lost a fair amount of weight at her last visit.  She has an occasional loose stool but bowels generally stable.  She had a negative Cologuard test in 06/2018.  She adamantly denies any colonoscopy in the future as well as endoscopy.  [name] 01/26/19 - stable changes of cirrhosis, no HCC [name] 11/17/17 - stable cirrhosis, trace ascites MRI LIVER 12/08/2016 - regenerating nodule, cirrhosis  CT scan 08/10/18: IMPRESSION: 1. The dominant left periaortic  lymph node has significantly increased in size, currently 3.1 cm in short axis, formerly 2.4 cm, compatible with malignancy. 2. There is notable hepatic cirrhosis with splenorenal shunting and uphill paraesophageal varices as well as some reactive lymph nodes along the porta hepatis. 3. Mildly prominent mediastinal lymph nodes are roughly stable compared to the prior exam. 4. Other imaging findings of potential clinical significance: Aortic Atherosclerosis (ICD10-I70.0). Coronary atherosclerosis. Small persistent right pleural effusion. Cardiomegaly with interstitial accentuation in the lungs which may be from mild interstitial edema. Stable splenomegaly. Nonobstructive right nephrolithiasis. Mild ascites. Prominent left hip arthropathy. Stable 5 mm in diameter right middle lobe pulmonary nodule.  PET scan 04/02/2018 -  IMPRESSION: 1. Interval increase in size and metabolic activity of retroperitoneal lymph nodes LEFT of aorta at the level of the kidneys consistent with metastatic nodal disease progression. 2. Interval increase in metabolic activity of small LEFT axillary lymph nodes and mild increase in size of small prominent mediastinal lymph nodes with at least one with mild activity. While the hypermetabolic activity is low and these lymph nodes are small, concern for progression of metastatic adenopathy outside the pelvis   Past Medical History:  Diagnosis Date  . Allergy   . Anemia    low iron hx  . Arthritis   . Breast cancer ([place]) 2014   a. 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 Cataract    removed ou  .  Chronic combined systolic and diastolic CHF (congestive heart failure) ([place])    a. 08/2012 Echo: EF 55-60%, no rwma, mod MR;  b. 07/2014 Echo: EF 45-50%, distal antsept HK, mod TR/MR, mildly bil-atrial enlargement.  . Clear Cell Ovarian Cancer    a. 06/2014  s/p TAH/BSO debulking/lymph node dissection;  b. Now on chemo.  . Depression   . Diabetes mellitus   . DJD (degenerative joint disease) of lumbar spine   . DVT (deep venous thrombosis) ([facility])    hx of on HRT left leg ~2006  . Gallstones   . GERD (gastroesophageal reflux disease)   . Hyperlipidemia   . Hypertension   . Hypothyroidism   . Liver disease, chronic, with cirrhosis ([place])    ? autoimmune  . OSA on CPAP    cpap 4.5 setting  . Paroxysmal atrial fibrillation ([facility])    a. CHA2DS2VASc = 4-->coumadin.  [name] Physical deconditioning   . PPD positive, treated    rx inh   . Sleep apnea      Past Surgical History:  Procedure Laterality Date  . ABDOMINAL HYSTERECTOMY N/A 06/27/2014   Procedure: HYSTERECTOMY ABDOMINAL TOTAL;  Surgeon: Everitt Amber, MD;  Location: [facility];  Service: Gynecology;  Laterality: N/A;  . BREAST BIOPSY     left breast  . BREAST EXCISIONAL BIOPSY Left 2002  . CATARACT EXTRACTION    . CHOLECYSTECTOMY    . EYE SURGERY    . FOOT SURGERY    . history of chemotherapy x 2 treatments, radiation tx  2014  . LAPAROTOMY N/A 06/27/2014   Procedure: EXPLORATORY LAPAROTOMY;  Surgeon: Everitt Amber, MD;  Location: [facility];  Service: Gynecology;  Laterality: N/A;  . MASTECTOMY Right 2014  . MASTECTOMY MODIFIED RADICAL  06/08/2012   Procedure: MASTECTOMY MODIFIED RADICAL;  Surgeon: Edward Jolly, MD;  Location: [place];  Service: General;  Laterality: Right;  . pac removed  2015  . PEG TUBE PLACEMENT     peg removed 2015  . PERCUTANEOUS LIVER BIOPSY    . PORTACATH PLACEMENT  06/08/2012   Procedure: INSERTION PORT-A-CATH;  Surgeon: Edward Jolly, MD;  Location: [place];  Service: General;  Laterality: Left;  . SALPINGOOPHORECTOMY Bilateral 06/27/2014   Procedure: BILATERAL SALPINGO OOPHORECTOMY/TUMOR DEBULKING/LYMPHNODE DISSECTION, OMENTECTOMY;  Surgeon: Everitt Amber, MD;  Location: [facility];  Service: Gynecology;  Laterality: Bilateral;  . TUBAL LIGATION     Family History   Problem Relation Age of Onset  . Diabetes Mother   . Hypertension Mother   . Arthritis Mother   . Heart disease Mother   . Heart failure Mother   . Other Mother        benign breast mass  . Stroke Father   . Heart disease Father   . Dementia Father   . Diabetes Paternal Grandmother   . Colon cancer Paternal Grandfather        dx. 70s-80s  . Colon cancer Maternal Grandfather   . Other Daughter        potentially has had negative BRCA testing  . Other Daughter        hysterectomy; potentially has had negative genetic testing  . Rectal cancer Neg Hx   . Esophageal cancer Neg Hx   . Liver cancer Neg Hx    Social History   Tobacco Use  . Smoking status: Former Smoker    Packs/day: 1.00    Years: 1.00    Pack years: 1.00    Quit date: 12/09/1998  Years since quitting: 20.2  . Smokeless tobacco: Former Network engineer Use Topics  . Alcohol use: No  . Drug use: No    Comment: quit age 73 only smoked as a teen 1 year   Current Outpatient Medications  Medication Sig Dispense Refill  . acetaminophen (TYLENOL) 500 MG tablet Take 500 mg by mouth every 6 (six) hours as needed for moderate pain.    [name] ALPRAZolam (XANAX) 0.5 MG tablet Take 1 tablet (0.5 mg total) by mouth 2 (two) times daily as needed. for anxiety 18 tablet 0  . Blood Glucose Monitoring Suppl (ONETOUCH VERIO) w/Device KIT Use as directed twice a day.  Dx code: E11.9 1 kit 0  . buPROPion (WELLBUTRIN XL) 150 MG 24 hr tablet TAKE 1 TABLET BY MOUTH  DAILY 90 tablet 1  . cephALEXin (KEFLEX) 500 MG capsule Take 1 capsule (500 mg total) by mouth 4 (four) times daily. 14 capsule 0  . cholestyramine (QUESTRAN) 4 g packet Take 1 packet (4 g total) by mouth 2 (two) times daily. 30 each 0  . clotrimazole (MYCELEX) 10 MG troche Take 1 tablet (10 mg total) by mouth 3 (three) times daily. 30 tablet 0  . diazepam (VALIUM) 5 MG tablet Pt to take 1 tab 1 hour before MRI and may repeat if needed 2 tablet 0  . escitalopram (LEXAPRO) 20  MG tablet TAKE 1 TABLET BY MOUTH  EVERY MORNING 90 tablet 1  . famotidine (PEPCID) 20 MG tablet Take 1 tablet (20 mg total) by mouth daily. 30 tablet 0  . furosemide (LASIX) 20 MG tablet TAKE 1 TABLET BY MOUTH  DAILY 90 tablet 3  . glimepiride (AMARYL) 2 MG tablet TAKE 1 TABLET BY MOUTH TWO  TIMES DAILY 180 tablet 0  . glucose blood (ONETOUCH VERIO) test strip USE TO CHECK BLOOD SUGAR  TWO TIMES DAILY. Dx Code: E11.9 100 each 1  . ketoconazole (NIZORAL) 2 % cream Apply 1 application topically daily. 15 g 3  . letrozole (FEMARA) 2.5 MG tablet TAKE 1 TABLET BY MOUTH  DAILY 90 tablet 4  . levothyroxine (SYNTHROID) 150 MCG tablet TAKE 1 TABLET BY MOUTH  DAILY 90 tablet 3  . lidocaine-prilocaine (EMLA) cream APPLY TOPICALLY OVER PORT  AREA 1-2 HOURS BEFORE  CHEMOTHERAPY AS NEEDED 30 g 1  . LORazepam (ATIVAN) 0.5 MG tablet Take 1 tablet (0.5 mg total) by mouth every 8 (eight) hours. Ativan 0.54m - Take 1 tablet prior to MRI imaging - may repeat if needed for anxiety. 30 tablet 0  . metFORMIN (GLUCOPHAGE-XR) 500 MG 24 hr tablet TAKE 2 TABLETS BY MOUTH  EVERY EVENING 180 tablet 3  . metroNIDAZOLE (FLAGYL) 500 MG tablet Take 1 tablet (500 mg total) by mouth 3 (three) times daily. 42 tablet 0  . metroNIDAZOLE (METROGEL) 1 % gel Apply topically daily. 45 g 0  . nadolol (CORGARD) 40 MG tablet Take 1 tablet (40 mg total) by mouth daily. 90 tablet 3  . NONFORMULARY OR COMPOUNDED ITEM Lipid panel   Dx code: E78.5, CMP Dx Code: I10,  A1C Dx code: E11.9 nonfasting is ok 1 each 0  . nystatin (MYCOSTATIN) 100000 UNIT/ML suspension Take 10 mLs (1,000,000 Units total) by mouth 4 (four) times daily. 240 mL 0  . OLANZapine (ZYPREXA) 2.5 MG tablet TAKE 1 TABLET BY MOUTH  NIGHTLY AT BEDTIME 90 tablet 1  . omeprazole (PRILOSEC) 20 MG capsule Take 1 capsule (20 mg total) by mouth daily. 90 capsule 3  .  ondansetron (ZOFRAN) 8 MG tablet Take 1 tablet (8 mg total) by mouth every 8 (eight) hours as needed for nausea or  vomiting. 270 tablet 0  . OneTouch Delica Lancets 87F MISC Use to check blood sugar twice a day.  DX Code: E11.9 200 each 1  . oxybutynin (DITROPAN) 5 MG tablet Take 1 tablet (5 mg total) by mouth 3 (three) times daily. 270 tablet 03  . palbociclib (IBRANCE) 100 MG tablet Take 1 tablet (100 mg total) by mouth daily. Take for 21 days on, 7 days off, repeat every 28 days. 21 tablet 6  . potassium chloride (K-DUR) 10 MEQ tablet TAKE 1 TABLET BY MOUTH  TWICE DAILY 180 tablet 3  . pravastatin (PRAVACHOL) 10 MG tablet TAKE 1 TABLET BY MOUTH  DAILY 90 tablet 1  . propranolol (INDERAL) 20 MG tablet TAKE 1 TABLET BY MOUTH TWO  TIMES DAILY 180 tablet 3  . Toremifene Citrate (FARESTON) 60 MG tablet Take 1 tablet (60 mg total) by mouth daily. 90 tablet 4   No current facility-administered medications for this visit.    Allergies  Allergen Reactions  . Olmesartan Medoxomil Cough  . Adhesive [Tape] Rash  . Tetracycline Hcl Other (See Comments)  . Venlafaxine Other (See Comments)    severe dry mouth     Review of Systems: All systems reviewed and negative except where noted in HPI.   Lab Results  Component Value Date   WBC 3.0 (L) 12/30/2018   HGB 10.6 (L) 12/30/2018   HCT 30.9 (L) 12/30/2018   MCV 114.9 (H) 12/30/2018   PLT 50 (L) 12/30/2018    Lab Results  Component Value Date   CREATININE 0.54 01/12/2019   BUN 6 01/12/2019   NA 138 01/12/2019   K 3.3 (L) 01/12/2019   CL 108 01/12/2019   CO2 23 01/12/2019    Lab Results  Component Value Date   ALT 8 01/12/2019   AST 24 01/12/2019   ALKPHOS 81 01/12/2019   BILITOT 1.6 (H) 01/12/2019    Lab Results  Component Value Date   INR 1.5 (H) 12/21/2017   INR 1.5 (H) 07/01/2017   INR 1.6 (H) 02/06/2017   PROTIME 14.4 (H) 11/03/2014   PROTIME 14.4 (H) 10/13/2014   PROTIME 14.4 (H) 10/06/2014     Physical Exam: BP (!) 120/58   Pulse 64   Temp 98.1 F (36.7 C)   Ht _0  (1.651 m)   Wt 145 lb (65.8 kg)   BMI 24.13 kg/m   Constitutional: Pleasant, female in no acute distress, in wheelchair HEENT: Normocephalic and atraumatic. Conjunctivae are normal. No scleral icterus. Neck supple.  Cardiovascular: Normal rate, regular rhythm.  Pulmonary/chest: Effort normal and breath sounds normal. No wheezing, rales or rhonchi. Abdominal: Soft, nondistended, nontender. . There are no masses palpable.  Extremities: no edema Lymphadenopathy: No cervical adenopathy noted. Neurological: Alert and oriented to person place and time. Skin: Skin is warm and dry. No rashes noted. Psychiatric: Normal mood and affect. Behavior is normal.   ASSESSMENT AND PLAN: 63 year old female here for reassessment of the following:  Cirrhosis / esophageal varices - cirrhosis has been stable since of last seen her.  She does have varices on cross-sectional imaging, she is taking propranolol for this.  She has declined any endoscopic evaluation of these issues.  She is due for INR, AFP, CBC, she will have this done with the rest of her blood work with her oncologist in the upcoming weeks.  We discussed  cirrhosis, risks for decompensation moving forward.  She unfortunately is not a liver transplant candidate given her active malignancy which is being treated.  Further she looks quite frail to me during her visit today, I am concerned about her underlying malignancy and progression, she is due for another PET scan in November per Dr. Jana Hakim and will await that result.  Otherwise she is due for surveillance ultrasound in 07/2019, will await labs, continue propranolol.  She agreed, I will continue to see over 6 months  Ovarian cancer - awaiting PET scan per Oncology.  Poor appetite - I think this correlates with Ibrance regimen which can cause loss of appetite and digestive issues, suspect this may be the culprit.  We discussed potential role of endoscopy to evaluate some of the symptoms especially with her weight loss, she declined that.  I offered her a  trial of Remeron to stimulate appetite, we discussed that for a bit and she declines for now.  She states her weight is stable and will do her best to supplement her meals with Ensure, etc.  Dardanelle Cellar, MD [facility]

## 2019-03-08 NOTE — Patient Instructions (Addendum)
If you are age 65 or older, your body mass index should be between 23-30. Your Body mass index is 24.13 kg/m. If this is out of the aforementioned range listed, please consider follow up with your Primary Care Provider.  If you are age 37 or younger, your body mass index should be between 19-25. Your Body mass index is 24.13 kg/m. If this is out of the aformentioned range listed, please consider follow up with your Primary Care Provider.   To help prevent the possible spread of infection to our patients, communities, and staff; we will be implementing the following measures:  As of now we are not allowing any visitors/family members to accompany you to any upcoming appointments with [facility]. If you have any concerns about this please contact our office to discuss prior to the appointment.   You will be due for an Ultrasound in March 2021.  We will send your lab orders to Dr. Magrinot's office to be done at your next blood draw.   Thank you for entrusting me with your care and for choosing [facility], Dr. [place] Cellar

## 2019-03-08 NOTE — Telephone Encounter (Signed)
My manager informed me that Magrinat's office will not draw our labs. Called pt and LM to go to the lab at her convenience.

## 2019-03-09 ENCOUNTER — Other Ambulatory Visit (INDEPENDENT_AMBULATORY_CARE_PROVIDER_SITE_OTHER): Payer: Medicare Other

## 2019-03-09 DIAGNOSIS — I8500 Esophageal varices without bleeding: Secondary | ICD-10-CM | POA: Diagnosis not present

## 2019-03-09 DIAGNOSIS — K7460 Unspecified cirrhosis of liver: Secondary | ICD-10-CM

## 2019-03-09 DIAGNOSIS — R630 Anorexia: Secondary | ICD-10-CM | POA: Diagnosis not present

## 2019-03-09 LAB — COMPREHENSIVE METABOLIC PANEL
ALT: 19 U/L (ref 0–35)
AST: 33 U/L (ref 0–37)
Albumin: 3 g/dL — ABNORMAL LOW (ref 3.5–5.2)
Alkaline Phosphatase: 97 U/L (ref 39–117)
BUN: 7 mg/dL (ref 6–23)
CO2: 24 mEq/L (ref 19–32)
Calcium: 8.2 mg/dL — ABNORMAL LOW (ref 8.4–10.5)
Chloride: 109 mEq/L (ref 96–112)
Creatinine, Ser: 0.51 mg/dL (ref 0.40–1.20)
GFR: 121.02 mL/min (ref >60.00)
Glucose, Bld: 192 mg/dL — ABNORMAL HIGH (ref 70–99)
Potassium: 3.6 mEq/L (ref 3.5–5.1)
Sodium: 140 mEq/L (ref 135–145)
Total Bilirubin: 1.2 mg/dL (ref 0.2–1.2)
Total Protein: 6.2 g/dL (ref 6.0–8.3)

## 2019-03-09 LAB — CBC WITH DIFFERENTIAL/PLATELET
Basophils Absolute: 0 10*3/uL (ref 0.0–0.1)
Basophils Relative: 0.7 % (ref 0.0–3.0)
Eosinophils Absolute: 0.2 10*3/uL (ref 0.0–0.7)
Eosinophils Relative: 6.9 % — ABNORMAL HIGH (ref 0.0–5.0)
HCT: 29.7 % — ABNORMAL LOW (ref 36.0–46.0)
Hemoglobin: 10 g/dL — ABNORMAL LOW (ref 12.0–15.0)
Lymphocytes Relative: 22.6 % (ref 12.0–46.0)
Lymphs Abs: 0.5 10*3/uL — ABNORMAL LOW (ref 0.7–4.0)
MCHC: 33.6 g/dL (ref 30.0–36.0)
MCV: 122.6 fl — ABNORMAL HIGH (ref 78.0–100.0)
Monocytes Absolute: 0.3 10*3/uL (ref 0.1–1.0)
Monocytes Relative: 12.5 % — ABNORMAL HIGH (ref 3.0–12.0)
Neutro Abs: 1.3 10*3/uL — ABNORMAL LOW (ref 1.4–7.7)
Neutrophils Relative %: 57.3 % (ref 43.0–77.0)
Platelets: 51 10*3/uL — ABNORMAL LOW (ref 150.0–400.0)
RBC: 2.42 Mil/uL — ABNORMAL LOW (ref 3.87–5.11)
RDW: 20 % — ABNORMAL HIGH (ref 11.5–15.5)
WBC: 2.3 10*3/uL — ABNORMAL LOW (ref 4.0–10.5)

## 2019-03-09 LAB — PROTIME-INR
INR: 1.6 ratio — ABNORMAL HIGH (ref 0.8–1.0)
Prothrombin Time: 18.3 s — ABNORMAL HIGH (ref 9.6–13.1)

## 2019-03-10 LAB — AFP TUMOR MARKER: AFP-Tumor Marker: 4.6 ng/mL

## 2019-03-14 ENCOUNTER — Other Ambulatory Visit: Payer: Self-pay

## 2019-03-14 DIAGNOSIS — R634 Abnormal weight loss: Secondary | ICD-10-CM

## 2019-03-14 DIAGNOSIS — C569 Malignant neoplasm of unspecified ovary: Secondary | ICD-10-CM

## 2019-03-14 DIAGNOSIS — K7460 Unspecified cirrhosis of liver: Secondary | ICD-10-CM

## 2019-03-14 NOTE — Progress Notes (Signed)
Orders entered per [place] result note 11-2.

## 2019-03-15 ENCOUNTER — Other Ambulatory Visit (INDEPENDENT_AMBULATORY_CARE_PROVIDER_SITE_OTHER): Payer: Medicare Other

## 2019-03-15 DIAGNOSIS — C569 Malignant neoplasm of unspecified ovary: Secondary | ICD-10-CM

## 2019-03-15 DIAGNOSIS — R634 Abnormal weight loss: Secondary | ICD-10-CM

## 2019-03-15 DIAGNOSIS — K7460 Unspecified cirrhosis of liver: Secondary | ICD-10-CM | POA: Diagnosis not present

## 2019-03-15 LAB — CBC WITH DIFFERENTIAL/PLATELET
Basophils Absolute: 0 10*3/uL (ref 0.0–0.1)
Basophils Relative: 1 % (ref 0.0–3.0)
Eosinophils Absolute: 0.1 10*3/uL (ref 0.0–0.7)
Eosinophils Relative: 2.8 % (ref 0.0–5.0)
HCT: 29.7 % — ABNORMAL LOW (ref 36.0–46.0)
Hemoglobin: 10.1 g/dL — ABNORMAL LOW (ref 12.0–15.0)
Lymphocytes Relative: 16.9 % (ref 12.0–46.0)
Lymphs Abs: 0.5 10*3/uL — ABNORMAL LOW (ref 0.7–4.0)
MCHC: 34.2 g/dL (ref 30.0–36.0)
MCV: 119.5 fl — ABNORMAL HIGH (ref 78.0–100.0)
Monocytes Absolute: 0.1 10*3/uL (ref 0.1–1.0)
Monocytes Relative: 3.9 % (ref 3.0–12.0)
Neutro Abs: 2 10*3/uL (ref 1.4–7.7)
Neutrophils Relative %: 75.4 % (ref 43.0–77.0)
Platelets: 62 10*3/uL — ABNORMAL LOW (ref 150.0–400.0)
RBC: 2.49 Mil/uL — ABNORMAL LOW (ref 3.87–5.11)
RDW: 18.4 % — ABNORMAL HIGH (ref 11.5–15.5)
WBC: 2.7 10*3/uL — ABNORMAL LOW (ref 4.0–10.5)

## 2019-03-15 LAB — FOLATE: Folate: 22.4 ng/mL (ref >5.9)

## 2019-03-15 LAB — VITAMIN B12: Vitamin B-12: 520 pg/mL (ref 211–911)

## 2019-03-16 ENCOUNTER — Telehealth: Payer: Self-pay

## 2019-03-16 NOTE — Telephone Encounter (Signed)
Spoke with husband and let him know that coupons were placed in the mail today.

## 2019-03-16 NOTE — Telephone Encounter (Signed)
Copied from [place] [id number]. Topic: General - Other >> Mar 15, 2019  8:15 AM Richardo Priest, NT wrote: Pt's husband has called back stating he is still waiting for a call back and would like to hear something soon about those coupons. Please advise.  >> Mar 14, 2019  4:51 PM Percell Belt A wrote: Pt called back to see, if someone could call him and left him know if the office has any ensure coupons  Best number  [phone] He would like to speak with medicare wellness nurse.  He would like a call back from her

## 2019-03-20 ENCOUNTER — Other Ambulatory Visit: Payer: Self-pay | Admitting: Family Medicine

## 2019-03-31 MED FILL — IBRANCE 100 MG TABS: 100 | 28 days supply | Qty: 21 | Fill #5

## 2019-04-11 ENCOUNTER — Other Ambulatory Visit: Payer: Self-pay

## 2019-04-11 ENCOUNTER — Other Ambulatory Visit: Payer: Self-pay | Admitting: Oncology

## 2019-04-11 ENCOUNTER — Encounter (HOSPITAL_COMMUNITY)
Admission: RE | Admit: 2019-04-11 | Discharge: 2019-04-11 | Disposition: A | Discharge status: Home / Self Care | Payer: Medicare Other | Source: Ambulatory Visit | Attending: Oncology | Admitting: Oncology

## 2019-04-11 DIAGNOSIS — C50811 Malignant neoplasm of overlapping sites of right female breast: Secondary | ICD-10-CM

## 2019-04-11 DIAGNOSIS — C772 Secondary and unspecified malignant neoplasm of intra-abdominal lymph nodes: Secondary | ICD-10-CM

## 2019-04-11 DIAGNOSIS — I5042 Chronic combined systolic (congestive) and diastolic (congestive) heart failure: Secondary | ICD-10-CM | POA: Diagnosis not present

## 2019-04-11 DIAGNOSIS — I480 Paroxysmal atrial fibrillation: Secondary | ICD-10-CM | POA: Diagnosis not present

## 2019-04-11 DIAGNOSIS — C569 Malignant neoplasm of unspecified ovary: Secondary | ICD-10-CM

## 2019-04-11 DIAGNOSIS — K7460 Unspecified cirrhosis of liver: Secondary | ICD-10-CM | POA: Diagnosis not present

## 2019-04-11 DIAGNOSIS — C50011 Malignant neoplasm of nipple and areola, right female breast: Secondary | ICD-10-CM | POA: Insufficient documentation

## 2019-04-11 DIAGNOSIS — Z170 Estrogen receptor positive status [ER+]: Secondary | ICD-10-CM | POA: Insufficient documentation

## 2019-04-11 DIAGNOSIS — C801 Malignant (primary) neoplasm, unspecified: Secondary | ICD-10-CM | POA: Diagnosis not present

## 2019-04-11 DIAGNOSIS — R188 Other ascites: Secondary | ICD-10-CM | POA: Diagnosis not present

## 2019-04-11 DIAGNOSIS — I110 Hypertensive heart disease with heart failure: Secondary | ICD-10-CM | POA: Insufficient documentation

## 2019-04-11 DIAGNOSIS — G63 Polyneuropathy in diseases classified elsewhere: Secondary | ICD-10-CM | POA: Diagnosis not present

## 2019-04-11 DIAGNOSIS — Z79899 Other long term (current) drug therapy: Secondary | ICD-10-CM | POA: Insufficient documentation

## 2019-04-11 DIAGNOSIS — R161 Splenomegaly, not elsewhere classified: Secondary | ICD-10-CM | POA: Insufficient documentation

## 2019-04-11 DIAGNOSIS — J90 Pleural effusion, not elsewhere classified: Secondary | ICD-10-CM | POA: Insufficient documentation

## 2019-04-11 LAB — GLUCOSE, CAPILLARY: Glucose-Capillary: 82 mg/dL (ref 70–99)

## 2019-04-11 MED ORDER — FLUDEOXYGLUCOSE F - 18 (FDG) INJECTION
7.2400 | Freq: Once | INTRAVENOUS | Status: AC
  Administered 2019-04-11: 09:00:00 7.24 via INTRAVENOUS
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
## 2019-04-12 ENCOUNTER — Telehealth: Payer: Self-pay

## 2019-04-12 ENCOUNTER — Telehealth: Payer: Self-pay | Admitting: Adult Health

## 2019-04-12 ENCOUNTER — Other Ambulatory Visit: Payer: Self-pay | Admitting: Oncology

## 2019-04-12 NOTE — Telephone Encounter (Signed)
Confirmed 12/9 [place] with patient. Moved from 12/8 per 12/1 schedule message.

## 2019-04-12 NOTE — Progress Notes (Unsigned)
I called [name] and discussed the results of the PET scan with her and gone.  I also reviewed it with Dr. Denman George and have sent to Dr. Freddi Che to note asking him to take a look at it.  The case also will be presented at [place].  At this point we are requesting a PD-L1 on the material from last year surgery and considering radiation.

## 2019-04-12 NOTE — Telephone Encounter (Signed)
PD-L1 requested per MD request.   [facility] notified.

## 2019-04-18 ENCOUNTER — Other Ambulatory Visit: Payer: Medicare Other

## 2019-04-18 ENCOUNTER — Ambulatory Visit: Payer: Medicare Other | Admitting: Oncology

## 2019-04-19 ENCOUNTER — Other Ambulatory Visit: Payer: Medicare Other

## 2019-04-19 ENCOUNTER — Ambulatory Visit: Payer: Medicare Other | Admitting: Oncology

## 2019-04-19 NOTE — Progress Notes (Signed)
[place]  [phone] [phone]    ID: [name]   DOB: 05-23-1955  MR#: [id number]  EUM#:[id number]  Patient Care Team: Carollee Herter, Alferd Apa, DO as PCP - General (Family Medicine) Magrinat, Virgie Dad, MD as Consulting Physician (Oncology) Excell Seltzer, MD (Inactive) as Consulting Physician (General Surgery) Everitt Amber, MD as Consulting Physician (Obstetrics and Gynecology) Gaynelle Arabian, MD as Consulting Physician (Orthopedic Surgery) Armbruster, Carlota Raspberry, MD as Consulting Physician (Gastroenterology) Garald Balding, MD as Consulting Physician (Orthopedic Surgery)  OTHER MD: Daniel Nones, MD; Alger Simons, MD    CHIEF COMPLAINT:  Estrogen receptor positive Breast Cancer; Ovarian cancer; DVT  CURRENT TREATMENT: toremifene, palbociclib   INTERVAL HISTORY: [name] returns today for follow-up and treatment of her estrogen receptor positive breast cancer and her ovarian cancer.   She is accompanied today by her husband.  In April she was switched her from letrozole to toremifene to see if this helped with her rising Ca1 25.  Palbociclib was started at that time, and 100 mg daily, 21 days on, 7 days off.  She is tolerating these medications well, without any unusual side effects.  Since her last visit, she underwent PET scan on 04/11/2019 that showed progression of retroperitoneal lymphadenopathy, stable prevascular lymph node, and no other site of metastatic disease.  A PDL1 was sent from her original pathology, and this is pending.     REVIEW OF SYSTEMS: [name] has been feeling moderately well.  She notes a decreased appetite, however her weight is up 4 pounds since October.  She is eating one meal a day an dif she misses a meal she will drink two ensure high protein beverages.  Yu is weak and uses a wheel chair a majority of the time because of having bad knees.    [name] has no new pain.  She is without fever, chills, chest  pain, cough, shortness of breath, bowel/bladder issues, headaches, vision changes or any other concerns.  A detailed ROS was otherwise non contributory.    BREAST CANCER HISTORY: From the original consult note:  [name] noted a mass in her right breast mid December 2013, and as it did not spontaneously resolve over a couple of weeks she brought it to her primary physician's attention. She was set up for diagnostic mammography and right breast ultrasonography at the breast Center 05/10/2012. (Note that the patient's most recent prior mammography had been in October 2008). The current study showed a spiculated mass in the superior subareolar portion of the right breast measuring approximately 5 cm and associated with pleomorphic calcifications. This was firm and palpable. There was right nipple retraction and skin thickening. Ultrasound confirmed an irregularly marginated hypoechoic mass measuring 3.5 cm by ultrasound, and an abnormal appearing lower right axillary lymph node measuring 2.6 cm.  Biopsies of both the breast mass and the abnormal appearing lymph node were performed 05/21/2012. Both showed an invasive ductal carcinoma, grade 2, with similar prognostic panels (the breast mass was 100% estrogen and 73% progesterone receptor positive, with an MIB-1 of 5%; the lymph node was 100% estrogen 100% progesterone receptor positive, with an MIB-1 of 20%). Both masses were HER-2 negative.  Breast MRI obtained at [facility] imaging 05/29/2012 confirmed a dominant mass in the retroareolar right breast measuring 4.4 cm maximally. There was a satellite nodule inferior and lateral to this mass, measuring 1.7 cm. There were no other masses in either breast. Aside from the previously biopsied lymph node there were other mildly  enhancing level I right axillary lymph nodes which did not appear pathologic. There was no other lymphadenopathy noted.    OVARIAN CANCER HISTORY: From the 06/16/2014 summary note:  "She  was admitted to [facility] with a diagnosis of possible pneumonia in late December 2015. Chest x-ray showed some increased density in the right upper lobe. CT scan was suggested and was obtained by Dr. Inda Castle. This showed 2 small nodules in the right middle lobe, measuring 8 and 4 mm respectively. Dr. Inda Castle then contacted [name] for further evaluation and we set Deryn up for a PET scan and which was performed late January. This showed the 2 nodules in the lung not to be hypermetabolic. This of course does not prove that they are not malignant. More importantly, there was a 13.7 cm cystic/solid mass in the upper pelvis associated with adenopathy,. All of this was hypermetabolic."  The patient was evaluated by gynecologic oncology and on 06/27/2014 she underwent surgery under Dr. Denman George. This consisted of an exploratory laparotomy with hysterectomy and abdominal salpingo-oophorectomy, as well as tumor debulking and omentectomy. 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 The tumor measured 11.5 cm, focally involve the ovarian capsule on the left; the uterus and right ovary and fallopian tube were unremarkable. One para-aortic lymph node was positive out of a total of 6 lymph nodes sampled (2 left common iliac, 2 left para-aortic, and to within the omental resection).  Her subsequent history is as detailed below   PAST MEDICAL HISTORY: Past Medical History:  Diagnosis Date   Allergy    Anemia    low iron hx   Arthritis    Breast cancer ([place]) 2014   a. Right - invasive ductal carcinoma with 2/18 lymph nodes involved (pT3, pN1a, stage IIIA), s/p R mastectomy 06/08/12, chemo (not well tolerated->d/c)   Cataract    removed ou   Chronic combined systolic and diastolic CHF (congestive heart failure) ([place])    a. 08/2012 Echo: EF 55-60%, no rwma, mod MR;  b. 07/2014 Echo: EF 45-50%, distal antsept  HK, mod TR/MR, mildly bil-atrial enlargement.   Clear Cell Ovarian Cancer    a. 06/2014 s/p TAH/BSO debulking/lymph node dissection;  b. Now on chemo.   Depression    Diabetes mellitus    DJD (degenerative joint disease) of lumbar spine    DVT (deep venous thrombosis) (HCC)    hx of on HRT left leg ~2006   Gallstones    GERD (gastroesophageal reflux disease)    Hyperlipidemia    Hypertension    Hypothyroidism    Liver disease, chronic, with cirrhosis ([place])    ? autoimmune   OSA on CPAP    cpap 4.5 setting   Paroxysmal atrial fibrillation (HCC)    a. CHA2DS2VASc = 4-->coumadin.   Physical deconditioning    PPD positive, treated    rx inh    Sleep apnea     PAST SURGICAL HISTORY: Past Surgical History:  Procedure Laterality Date   ABDOMINAL HYSTERECTOMY N/A 06/27/2014   Procedure: HYSTERECTOMY ABDOMINAL TOTAL;  Surgeon: Everitt Amber, MD;  Location: [facility];  Service: Gynecology;  Laterality: N/A;   BREAST BIOPSY     left breast   BREAST EXCISIONAL BIOPSY Left 2002   CATARACT EXTRACTION     CHOLECYSTECTOMY     EYE SURGERY     FOOT SURGERY     history of chemotherapy x 2 treatments,  radiation tx  2014   LAPAROTOMY N/A 06/27/2014   Procedure: EXPLORATORY LAPAROTOMY;  Surgeon: Everitt Amber, MD;  Location: [facility];  Service: Gynecology;  Laterality: N/A;   MASTECTOMY Right 2014   MASTECTOMY MODIFIED RADICAL  06/08/2012   Procedure: MASTECTOMY MODIFIED RADICAL;  Surgeon: Edward Jolly, MD;  Location: [facility];  Service: General;  Laterality: Right;   pac removed  2015   PEG TUBE PLACEMENT     peg removed 2015   PERCUTANEOUS LIVER BIOPSY     PORTACATH PLACEMENT  06/08/2012   Procedure: INSERTION PORT-A-CATH;  Surgeon: Edward Jolly, MD;  Location: [facility];  Service: General;  Laterality: Left;   SALPINGOOPHORECTOMY Bilateral 06/27/2014   Procedure: BILATERAL SALPINGO OOPHORECTOMY/TUMOR DEBULKING/LYMPHNODE DISSECTION, OMENTECTOMY;  Surgeon: Everitt Amber, MD;  Location: [facility];  Service: Gynecology;  Laterality: Bilateral;   TUBAL LIGATION      FAMILY HISTORY Family History  Problem Relation Age of Onset   Diabetes Mother    Hypertension Mother    Arthritis Mother    Heart disease Mother    Heart failure Mother    Other Mother        benign breast mass   Stroke Father    Heart disease Father    Dementia Father    Diabetes Paternal Grandmother    Colon cancer Paternal Grandfather        dx. 44s-80s   Colon cancer Maternal Grandfather    Other Daughter        potentially has had negative BRCA testing   Other Daughter        hysterectomy; potentially has had negative genetic testing   Rectal cancer Neg Hx    Esophageal cancer Neg Hx    Liver cancer Neg Hx    The patient's father died in his 87s with a history of dementia. He had had prior strokes. The patient's mother died in her 36s, with a history of congestive heart failure. Roizy had no brothers, one sister. There is no history of breast or ovarian cancer in the family.   GYNECOLOGIC HISTORY: Menarche age 89, first live birth age 1, she is GX P2, menopause approximately 15 years ago, on hormone replacement until 2010.   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 Her husband [name] works for [employer]. Daughter Paul Half is a Physiological scientist and lives in [place]. Daughter Santiago Bur and her family (husband and 2 children aged 65 and 3 years) currently live with the patient. Elizabelle is a member of a [employer].     ADVANCED DIRECTIVES: Not in place   HEALTH MAINTENANCE: (Updated October 2014) Social History   Tobacco Use   Smoking status: Former Smoker    Packs/day: 1.00    Years: 1.00    Pack years: 1.00    Quit date: 12/09/1998    Years since quitting: 20.3   Smokeless tobacco: Former Systems developer  Substance Use  Topics   Alcohol use: No   Drug use: No    Comment: quit age 32 only smoked as a teen 1 year     Colonoscopy: Never  PAP: Does not recall  Bone density:  08/14/2017 showed a T score of -2.7 osteoporosis  Lipid panel:   Allergies  Allergen Reactions   Olmesartan Medoxomil Cough   Adhesive [Tape] Rash   Tetracycline Hcl Other (See Comments)   Venlafaxine Other (See  Comments)    severe dry mouth    Current Outpatient Medications  Medication Sig Dispense Refill   acetaminophen (TYLENOL) 500 MG tablet Take 500 mg by mouth every 6 (six) hours as needed for moderate pain.     ALPRAZolam (XANAX) 0.5 MG tablet Take 1 tablet (0.5 mg total) by mouth 2 (two) times daily as needed. for anxiety 18 tablet 0   Blood Glucose Monitoring Suppl (ONETOUCH VERIO) w/Device KIT Use as directed twice a day.  Dx code: E11.9 1 kit 0   buPROPion (WELLBUTRIN XL) 150 MG 24 hr tablet TAKE 1 TABLET BY MOUTH  DAILY 90 tablet 1   diazepam (VALIUM) 5 MG tablet Pt to take 1 tab 1 hour before MRI and may repeat if needed 2 tablet 0   escitalopram (LEXAPRO) 20 MG tablet TAKE 1 TABLET BY MOUTH IN  THE MORNING 90 tablet 3   furosemide (LASIX) 20 MG tablet TAKE 1 TABLET BY MOUTH  DAILY 90 tablet 3   glucose blood (ONETOUCH VERIO) test strip USE TO CHECK BLOOD SUGAR  TWO TIMES DAILY. Dx Code: E11.9 100 each 1   levothyroxine (SYNTHROID) 150 MCG tablet TAKE 1 TABLET BY MOUTH  DAILY 90 tablet 3   lidocaine-prilocaine (EMLA) cream APPLY TOPICALLY OVER PORT  AREA 1-2 HOURS BEFORE  CHEMOTHERAPY AS NEEDED 30 g 1   LORazepam (ATIVAN) 0.5 MG tablet Take 1 tablet (0.5 mg total) by mouth every 8 (eight) hours. Ativan 0.28m - Take 1 tablet prior to MRI imaging - may repeat if needed for anxiety. 30 tablet 0   metFORMIN (GLUCOPHAGE-XR) 500 MG 24 hr tablet TAKE 2 TABLETS BY MOUTH  EVERY EVENING 180 tablet 3   OLANZapine (ZYPREXA) 2.5 MG tablet TAKE 1 TABLET BY MOUTH IN  THE EVENING AT BEDTIME 90 tablet 3    omeprazole (PRILOSEC) 20 MG capsule Take 1 capsule (20 mg total) by mouth daily. 90 capsule 3   ondansetron (ZOFRAN) 8 MG tablet TAKE 1 TABLET BY MOUTH  EVERY 8 HOURS AS NEEDED FOR NAUSEA OR VOMITING 90 tablet 4   OneTouch Delica Lancets 314EMISC Use to check blood sugar twice a day.  DX Code: E11.9 200 each 1   palbociclib (IBRANCE) 100 MG tablet Take 1 tablet (100 mg total) by mouth daily. Take for 21 days on, 7 days off, repeat every 28 days. 21 tablet 6   potassium chloride (K-DUR) 10 MEQ tablet TAKE 1 TABLET BY MOUTH  TWICE DAILY 180 tablet 3   pravastatin (PRAVACHOL) 10 MG tablet TAKE 1 TABLET BY MOUTH  DAILY 90 tablet 3   propranolol (INDERAL) 20 MG tablet TAKE 1 TABLET BY MOUTH TWO  TIMES DAILY 180 tablet 3   Toremifene Citrate (FARESTON) 60 MG tablet Take 1 tablet (60 mg total) by mouth daily. 90 tablet 4   No current facility-administered medications for this visit.     OBJECTIVE:   Vitals:   04/20/19 0924  BP: 123/68  Pulse: 90  Resp: 18  Temp: 97.6 F (36.4 C)  SpO2: 96%     Body mass index is 24.86 kg/m.    ECOG FS: 1 Filed Weights   04/20/19 0924  Weight: 149 lb 6.4 oz (67.8 kg)    GENERAL: Patient is a chronically ill appearing woman examined in wheelchair. HEENT:  Sclerae anicteric. Mask in place. Neck is supple.  NODES:  No cervical, supraclavicular, lymphadenopathy palpated.  LUNGS:  Clear to auscultation bilaterally.  No wheezes or rhonchi. HEART:  Regular  rate and rhythm. No murmur appreciated. ABDOMEN:  Soft, nontender.  Positive, normoactive bowel sounds.  MSK:  No focal spinal tenderness to palpation.  EXTREMITIES:  No peripheral edema.   SKIN:  Clear with no obvious rashes or skin changes. No nail dyscrasia. NEURO:  Nonfocal. Well oriented.  Appropriate affect.    Lab Results  Component Value Date   WBC 2.3 (L) 04/20/2019   NEUTROABS PENDING 04/20/2019   HGB 9.3 (L) 04/20/2019   HCT 27.7 (L) 04/20/2019   MCV 117.9 (H) 04/20/2019   PLT 35  (L) 04/20/2019     Chemistry      Component Value Date/Time   NA 140 03/09/2019 0948   NA 140 04/28/2017 0809   K 3.6 03/09/2019 0948   K 3.4 (L) 04/28/2017 0809   CL 109 03/09/2019 0948   CL 104 10/29/2012 1058   CO2 24 03/09/2019 0948   CO2 22 04/28/2017 0809   BUN 7 03/09/2019 0948   BUN 6.5 (L) 04/28/2017 0809   CREATININE 0.51 03/09/2019 0948   CREATININE 0.6 04/28/2017 0809      Component Value Date/Time   CALCIUM 8.2 (L) 03/09/2019 0948   CALCIUM 8.4 04/28/2017 0809   ALKPHOS 97 03/09/2019 0948   ALKPHOS 143 04/28/2017 0809   AST 33 03/09/2019 0948   AST 53 (H) 04/28/2017 0809   ALT 19 03/09/2019 0948   ALT 21 04/28/2017 0809   BILITOT 1.2 03/09/2019 0948   BILITOT 2.96 (H) 04/28/2017 0809      STUDIES: Nm Pet Image Restag (ps) Skull Base To Thigh  Result Date: 04/11/2019 CLINICAL DATA:  Subsequent treatment strategy for breast cancer. EXAM: NUCLEAR MEDICINE PET SKULL BASE TO THIGH TECHNIQUE: 7.24 mCi F-18 FDG was injected intravenously. Full-ring PET imaging was performed from the skull base to thigh after the radiotracer. CT data was obtained and used for attenuation correction and anatomic localization. Fasting blood glucose: 82 mg/dl COMPARISON:  PET-CT 04/02/2018. CT the chest, abdomen and pelvis 10/10/2018. FINDINGS: Mediastinal blood pool activity: SUV max 1.7 Liver activity: SUV max NA NECK: No hypermetabolic lymph nodes in the neck. Incidental CT findings: none CHEST: Stable nonenlarged 8 mm short axis prevascular lymph node (axial image 49 of series 3) demonstrating low level metabolic activity (SUVmax = 3.0), similar to the prior study. Multiple other prominent borderline enlarged mediastinal and bilateral hilar lymph nodes without definitive hypermetabolism. No hypermetabolic mediastinal or hilar nodes. No suspicious pulmonary nodules on the CT scan. Incidental CT findings: Left internal jugular single-lumen porta cath with tip terminating at the superior  cavoatrial junction. Dilatation of the pulmonic trunk (3.7 cm), concerning for pulmonary arterial hypertension. Moderate right pleural effusion lying dependently. Atherosclerotic calcifications in the thoracic aorta, as well as left main, left anterior descending, left circumflex and right coronary arteries. ABDOMEN/PELVIS: Enlarging left para-aortic retroperitoneal lymphadenopathy compared to prior studies, with the bulkiest node currently measuring 3.6 x 2.9 cm (axial image 116 of series 3), hypermetabolic (SUVmax = 75.1). New lower left para-aortic lymphadenopathy compared to the prior examination, currently measuring up to 2.3 x 1.9 cm (axial image 124 of series 3), also hypermetabolic (SUVmax = 02.5). No abnormal hypermetabolic activity within the liver, pancreas, adrenal glands, or spleen. Incidental CT findings: Liver has a shrunken appearance and nodular contour, indicative of underlying cirrhosis. Status post cholecystectomy. Spleen is enlarged measuring 15.6 x 7.2 cm. 2.4 cm low-attenuation lesion in the anterior aspect of the interpolar region of the left kidney, demonstrating no internal hypermetabolism, incompletely characterized, but  statistically likely to represent a cyst. Aortic atherosclerosis. Small volume of ascites. SKELETON: No focal hypermetabolic activity to suggest skeletal metastasis. Incidental CT findings: none IMPRESSION: 1. Progression of retroperitoneal lymphadenopathy, as detailed above, indicative of progressive metastatic disease. 2. Stable size and low-level hypermetabolism of nonenlarged prevascular lymph node (nonspecific). 3. No new sites of metastatic disease noted elsewhere in the neck, chest, abdomen or pelvis. 4. Cirrhosis. 5. Splenomegaly suggestive of portal venous hypertension. 6. Small volume of ascites. 7. Moderate right pleural effusion lying dependently. Electronically Signed   By: Vinnie Langton M.D.   On: 04/11/2019 11:49     ASSESSMENT: 63 y.o.  BRCA negative  [name]   BREAST CANCER (1)  status post right mastectomy  06/08/2012 for a pT3, pN1a, stage IIIA invasive ductal carcinoma of overlapping sites, grade 2,  estrogen and progesterone receptor positive, HER-2/neu negative, with an MIB-1 of 20%.    (2)  treated in the adjuvant setting with docetaxel/ cyclophosphamide given every 3 weeks. There were multiple and severe complications, and the  patient tolerated only two cycles, last dose 07/29/2012  (3) letrozole started 08/16/2012  (a) osteopenia, with a T score of -1.14 April 2013  (b) bone density on 08/14/2017 showed a T score of -2.7 osteoporosis  (c) discontinued with rising tumor markers and 1 enlarged left para-aortic lymph node  (4) postmastectomy radiation completed 12/24/2012  (5)  comorbidities include diabetes, hypertension, chronic liver disease with cirrhosis and fatty liver, and nutritional disturbance with temporarily dependence on PEG feeds (discontinued July 2014).  (6) restaging studies January 2016 show  (a) 8 mm and 4 mm Right middle lobe lung nodules, not metabolically active on PET   (b) hypermetabolic 97.3 cm mixed solid/cystic lesion in upper pelvis, with associated adenopathy (see #7)  OVARIAN CANCER (7) status post exploratory laparotomy 06/27/2014 with total abdominal hysterectomy, bilateral salpingo-oophorectomy, para-aortic lymphadenectomy, omentectomy and radical tumor debulking for a left-sided clear cell ovarian cancer, pT1c pN1, stage IIIC .  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 despite being on Coumadin for PAF  (subtherapeutic INR) with mobile Right common femoral clot  (a) status post thrombolysis 09/30/2014  (b) on Lovenox daily as of 10/02/2014, stopped April 2017  (c) IVC filter placed 10/26/2014 as mobile clot still noted Right groin  (d) IVC filter removed 01/30/2015 with documented resolution of the movable thrombus 01/16/2015  (e) normal quantitative D-dimer 08/06/2015 and 09/24/2015  (10) started abraxane 10/13/2014, repeated every 14 days for 6 cycles (12 doses), completed 03/26/2015  (11) genetics testing December 04, 2014 through the Breast/Ovarian Cancer Panel [facility] no deleterious mutations in ATM, BARD1, BRCA1, BRCA2, BRIP1, CDH1, CHEK2, FANCC, MLH1, MSH2, MSH6, NBN, PALB2, PMS2, PTEN, RAD51C, RAD51D, STK11, TP53, and XRCC2.    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  (a) spleen measured 14.3 cm on CT scan of the abdomen 10/17/2016  ONGOING MONITORING: (14) pet SCAN 04/02/2018 shows retroperitoneal adenopathy, hypermetabolic and small left axillary and mediastinal lymph nodes  (a) Ca 125 is informative  (b) MRI  of orbits 07/03/2018 no masses or findings of concern  (c) CT scans C/A/P on 08/10/2018 shows an enlarging left periaortic node, other nodes stable; also significant cirrhosis/ splenomegaly  (15) toremifene started 08/18/2018  (a) palbociclib added 04/13/22020 at 100 mg/ day 21/7   PLAN: Brittni is doing moderately well today.  Her weight is stable and she has no outward signs or symptoms of cancer progression.  This is good.  She continues on Toremefine with good tolerance.  She denies any challenges from taking the Palbociclib.  Her plt count is 35, and her ANC is pending.  She has h/o liver cirrhosis.  Her plt count normally is around 50-60.  Due to the decline, I reviewed this with Dr.  Jana Hakim.  Verenis was recommended to stop the Palbociclib.  We will recheck her labs and see her back in 4 weeks.  She was instructed not to restart the Palbociclib.    I gave Britaney a handout on thrombocytopenia.  She and her husband were instructed to call our office if she notes any easy bruising/bleeding.  In regards to the progression of her retroperitoneal lymphadenopathy, I reviewed it with Lakiah and her husband again.  I talked with Dr. Jana Hakim and Dr. Sondra Come.  This is likely secondary to the ovarian cancer.  PDL1 was sent last week from her pathology, and this is pending.  My nurse porsche is following up on this.  Jozalyn will be presented at gyn oncology conference on Monday, 04/25/2019.  Dr. Sondra Come believes Henslee will likely benefit from radiation, and will await the conference discussion and will set up to see her after that discussion takes place.  Khaleah was recommended to be as active as she can, and to also continue with her protein intake.  We gave Rosi a script for a wig.   Dr. Jana Hakim will see Caro back in 4 weeks for labs and f/u.  She was recommended to continue with the appropriate pandemic precautions. She knows to call for any questions that may arise between now and her next appointment.  We are happy to see her sooner if needed.  A total of (30) minutes of face-to-face time was spent with this patient with greater than 50% of that time in counseling and care-coordination.   Wilber Bihari, NP  04/20/19 10:10 AM Medical Oncology and Hematology [facility] [address] [place], [place] [address] Tel. [phone]    Fax. [phone]  I

## 2019-04-20 ENCOUNTER — Encounter: Payer: Self-pay | Admitting: Adult Health

## 2019-04-20 ENCOUNTER — Telehealth: Payer: Self-pay | Admitting: Oncology

## 2019-04-20 ENCOUNTER — Other Ambulatory Visit: Payer: Self-pay | Admitting: *Deleted

## 2019-04-20 ENCOUNTER — Inpatient Hospital Stay: Payer: Medicare Other | Attending: Adult Health

## 2019-04-20 ENCOUNTER — Other Ambulatory Visit: Payer: Self-pay

## 2019-04-20 ENCOUNTER — Inpatient Hospital Stay (HOSPITAL_BASED_OUTPATIENT_CLINIC_OR_DEPARTMENT_OTHER): Payer: Medicare Other | Admitting: Adult Health

## 2019-04-20 VITALS — BP 123/68 | HR 90 | Temp 97.6°F | Resp 18 | Ht 65.0 in | Wt 149.4 lb

## 2019-04-20 DIAGNOSIS — Z86718 Personal history of other venous thrombosis and embolism: Secondary | ICD-10-CM | POA: Insufficient documentation

## 2019-04-20 DIAGNOSIS — E039 Hypothyroidism, unspecified: Secondary | ICD-10-CM | POA: Insufficient documentation

## 2019-04-20 DIAGNOSIS — Z87891 Personal history of nicotine dependence: Secondary | ICD-10-CM | POA: Insufficient documentation

## 2019-04-20 DIAGNOSIS — Z9011 Acquired absence of right breast and nipple: Secondary | ICD-10-CM | POA: Diagnosis not present

## 2019-04-20 DIAGNOSIS — Z90722 Acquired absence of ovaries, bilateral: Secondary | ICD-10-CM | POA: Insufficient documentation

## 2019-04-20 DIAGNOSIS — Z853 Personal history of malignant neoplasm of breast: Secondary | ICD-10-CM | POA: Diagnosis not present

## 2019-04-20 DIAGNOSIS — C561 Malignant neoplasm of right ovary: Secondary | ICD-10-CM | POA: Diagnosis not present

## 2019-04-20 DIAGNOSIS — M8580 Other specified disorders of bone density and structure, unspecified site: Secondary | ICD-10-CM | POA: Diagnosis not present

## 2019-04-20 DIAGNOSIS — Z95828 Presence of other vascular implants and grafts: Secondary | ICD-10-CM | POA: Diagnosis not present

## 2019-04-20 DIAGNOSIS — Z800 Family history of malignant neoplasm of digestive organs: Secondary | ICD-10-CM | POA: Diagnosis not present

## 2019-04-20 DIAGNOSIS — C569 Malignant neoplasm of unspecified ovary: Secondary | ICD-10-CM

## 2019-04-20 DIAGNOSIS — C50011 Malignant neoplasm of nipple and areola, right female breast: Secondary | ICD-10-CM

## 2019-04-20 DIAGNOSIS — I480 Paroxysmal atrial fibrillation: Secondary | ICD-10-CM | POA: Diagnosis not present

## 2019-04-20 DIAGNOSIS — C562 Malignant neoplasm of left ovary: Secondary | ICD-10-CM

## 2019-04-20 DIAGNOSIS — Z90710 Acquired absence of both cervix and uterus: Secondary | ICD-10-CM | POA: Insufficient documentation

## 2019-04-20 DIAGNOSIS — Z79899 Other long term (current) drug therapy: Secondary | ICD-10-CM | POA: Diagnosis not present

## 2019-04-20 DIAGNOSIS — C50811 Malignant neoplasm of overlapping sites of right female breast: Secondary | ICD-10-CM

## 2019-04-20 DIAGNOSIS — Z170 Estrogen receptor positive status [ER+]: Secondary | ICD-10-CM | POA: Insufficient documentation

## 2019-04-20 DIAGNOSIS — Z8249 Family history of ischemic heart disease and other diseases of the circulatory system: Secondary | ICD-10-CM | POA: Insufficient documentation

## 2019-04-20 DIAGNOSIS — Z7901 Long term (current) use of anticoagulants: Secondary | ICD-10-CM | POA: Insufficient documentation

## 2019-04-20 DIAGNOSIS — C772 Secondary and unspecified malignant neoplasm of intra-abdominal lymph nodes: Secondary | ICD-10-CM

## 2019-04-20 DIAGNOSIS — G63 Polyneuropathy in diseases classified elsewhere: Secondary | ICD-10-CM

## 2019-04-20 DIAGNOSIS — Z8543 Personal history of malignant neoplasm of ovary: Secondary | ICD-10-CM | POA: Insufficient documentation

## 2019-04-20 DIAGNOSIS — Z7984 Long term (current) use of oral hypoglycemic drugs: Secondary | ICD-10-CM | POA: Diagnosis not present

## 2019-04-20 DIAGNOSIS — Z9079 Acquired absence of other genital organ(s): Secondary | ICD-10-CM | POA: Insufficient documentation

## 2019-04-20 DIAGNOSIS — E119 Type 2 diabetes mellitus without complications: Secondary | ICD-10-CM | POA: Insufficient documentation

## 2019-04-20 DIAGNOSIS — Z9221 Personal history of antineoplastic chemotherapy: Secondary | ICD-10-CM | POA: Diagnosis not present

## 2019-04-20 DIAGNOSIS — C801 Malignant (primary) neoplasm, unspecified: Secondary | ICD-10-CM

## 2019-04-20 DIAGNOSIS — D6959 Other secondary thrombocytopenia: Secondary | ICD-10-CM | POA: Diagnosis not present

## 2019-04-20 DIAGNOSIS — M818 Other osteoporosis without current pathological fracture: Secondary | ICD-10-CM | POA: Diagnosis not present

## 2019-04-20 LAB — CBC WITH DIFFERENTIAL/PLATELET
Abs Immature Granulocytes: 0.01 10*3/uL (ref 0.00–0.07)
Basophils Absolute: 0 10*3/uL (ref 0.0–0.1)
Basophils Relative: 0 %
Eosinophils Absolute: 0.1 10*3/uL (ref 0.0–0.5)
Eosinophils Relative: 3 %
HCT: 27.7 % — ABNORMAL LOW (ref 36.0–46.0)
Hemoglobin: 9.3 g/dL — ABNORMAL LOW (ref 12.0–15.0)
Immature Granulocytes: 0 %
Lymphocytes Relative: 23 %
Lymphs Abs: 0.5 10*3/uL — ABNORMAL LOW (ref 0.7–4.0)
MCH: 39.6 pg — ABNORMAL HIGH (ref 26.0–34.0)
MCHC: 33.6 g/dL (ref 30.0–36.0)
MCV: 117.9 fL — ABNORMAL HIGH (ref 80.0–100.0)
Monocytes Absolute: 0.1 10*3/uL (ref 0.1–1.0)
Monocytes Relative: 6 %
Neutro Abs: 1.6 10*3/uL — ABNORMAL LOW (ref 1.7–7.7)
Neutrophils Relative %: 68 %
Platelets: 35 10*3/uL — ABNORMAL LOW (ref 150–400)
RBC: 2.35 MIL/uL — ABNORMAL LOW (ref 3.87–5.11)
RDW: 15.5 % (ref 11.5–15.5)
WBC: 2.3 10*3/uL — ABNORMAL LOW (ref 4.0–10.5)
nRBC: 0 % (ref 0.0–0.2)

## 2019-04-20 LAB — COMPREHENSIVE METABOLIC PANEL
ALT: 12 U/L (ref 0–44)
AST: 31 U/L (ref 15–41)
Albumin: 2.7 g/dL — ABNORMAL LOW (ref 3.5–5.0)
Alkaline Phosphatase: 93 U/L (ref 38–126)
Anion gap: 6 (ref 5–15)
BUN: 8 mg/dL (ref 8–23)
CO2: 23 mmol/L (ref 22–32)
Calcium: 7.8 mg/dL — ABNORMAL LOW (ref 8.9–10.3)
Chloride: 110 mmol/L (ref 98–111)
Creatinine, Ser: 0.65 mg/dL (ref 0.44–1.00)
GFR calc Af Amer: >60 mL/min (ref >60)
GFR calc non Af Amer: >60 mL/min (ref >60)
Glucose, Bld: 180 mg/dL — ABNORMAL HIGH (ref 70–99)
Potassium: 3.5 mmol/L (ref 3.5–5.1)
Sodium: 139 mmol/L (ref 135–145)
Total Bilirubin: 1.3 mg/dL — ABNORMAL HIGH (ref 0.3–1.2)
Total Protein: 6.4 g/dL — ABNORMAL LOW (ref 6.5–8.1)

## 2019-04-20 LAB — FERRITIN: Ferritin: 248 ng/mL (ref 11–307)

## 2019-04-20 LAB — IRON AND TIBC
Iron: 112 ug/dL (ref 41–142)
Saturation Ratios: 48 % (ref 21–57)
TIBC: 231 ug/dL — ABNORMAL LOW (ref 236–444)
UIBC: 119 ug/dL — ABNORMAL LOW (ref 120–384)

## 2019-04-20 MED ORDER — SODIUM CHLORIDE 0.9% FLUSH
10.0000 mL | Freq: Once | INTRAVENOUS | Status: AC
  Administered 2019-04-20: 10 mL
  Filled 2019-04-20: qty 10

## 2019-04-20 MED ORDER — HEPARIN SOD (PORK) LOCK FLUSH 100 UNIT/ML IV SOLN
500.0000 [IU] | Freq: Once | INTRAVENOUS | Status: AC
  Administered 2019-04-20: 500 [IU]
  Filled 2019-04-20: qty 5

## 2019-04-20 NOTE — Patient Instructions (Signed)
Thrombocytopenia Thrombocytopenia is a condition in which you have a low number of platelets in your blood. Platelets are also called thrombocytes. Platelets are tiny cells in the blood. When you bleed, they clump together at the cut or injury to stop the bleeding. This is called blood clotting. Not having enough platelets can cause bleeding problems. Some cases of thrombocytopenia are mild while others are more severe. What are the causes? This condition may be caused by:  Decreased production of platelets. This may be caused by: ? Aplastic anemia. This is when your bone marrow stops making blood cells. ? Cancer in the bone marrow. ? Certain medicines, including chemotherapy. ? Infection in the bone marrow. ? Drinking a lot of alcohol.  Increased destruction of platelets. This may be caused by: ? Certain immune diseases. ? Certain medicines. ? Certain blood clotting disorders. ? Certain inherited disorders. ? Certain bleeding disorders. ? Pregnancy. ? Having an enlarged spleen (hypersplenism). In hypersplenism, the spleen gathers up platelets from circulation. This means that the platelets are not available to help with blood clotting. The spleen can be enlarged because of cirrhosis or other conditions. What are the signs or symptoms? Symptoms of this condition are the result of poor blood clotting. They will vary depending on how low the platelet counts are. Symptoms may include:  Abnormal bleeding.  Nosebleeds.  Heavy menstrual periods.  Blood in the urine or stool (feces).  A purplish discoloration in the skin (purpura).  Bruising.  A rash that looks like pinpoint, purplish-red spots (petechiae) on the skin and mucous membranes. How is this diagnosed?  This condition may be diagnosed with blood tests and a physical exam. Sometimes, a sample of bone marrow may be removed to look for the original cells (megakaryocytes) that make platelets. Other tests may be needed depending  on the cause. How is this treated? Treatment for this condition depends on the cause. Treatment options may include:  Treatment of another condition that is causing the low platelet count.  Medicines to help protect your platelets from being destroyed.  A replacement (transfusion) of platelets to stop or prevent bleeding.  Surgery to remove the spleen. Follow these instructions at home: Activity  Avoid activities that could cause injury or bruising, and follow instructions about how to prevent falls.  Take extra care not to cut yourself when you shave or when you use scissors, needles, knives, and other tools.  Take extra care to protect yourself from burns when ironing or cooking. General instructions   Check your skin and the inside of your mouth for bruising or bleeding as told by your health care provider.  Check your spit (sputum), urine, and stool for blood as told by your health care provider.  Do not drink alcohol.  Take over-the-counter and prescription medicines only as told by your health care provider.  Do not take any medicines that have aspirin or NSAIDs in them. These medicines can thin your blood and cause you to bleed more easily.  Tell all your health care providers, including dentists and eye doctors, about your condition. Contact a health care provider if you have:  Unexplained bruising. Get help right away if you have:  Active bleeding from anywhere on your body.  Blood in your sputum, urine, or stool. Summary  Thrombocytopenia is a condition in which you have a low number of platelets in your blood.  Platelets are needed for blood clotting.  Symptoms of this condition are the result of poor blood clotting and   may include abnormal bleeding, nosebleeds, and bruising.  This condition may be diagnosed with blood tests and a physical exam.  Treatment for this condition depends on the cause. This information is not intended to replace advice given  to you by your health care provider. Make sure you discuss any questions you have with your health care provider. Document Released: 04/28/2005 Document Revised: 01/28/2018 Document Reviewed: 01/28/2018 Elsevier Patient Education  2020 [employer]  

## 2019-04-20 NOTE — Telephone Encounter (Signed)
I talk with patient regarding schedule  

## 2019-04-21 LAB — CA 125: Cancer Antigen (CA) 125: 99 U/mL — ABNORMAL HIGH (ref 0.0–38.1)

## 2019-04-25 ENCOUNTER — Other Ambulatory Visit: Payer: Self-pay | Admitting: Oncology

## 2019-04-25 ENCOUNTER — Telehealth: Payer: Self-pay | Admitting: Oncology

## 2019-04-25 ENCOUNTER — Telehealth: Payer: Self-pay | Admitting: *Deleted

## 2019-04-25 DIAGNOSIS — C562 Malignant neoplasm of left ovary: Secondary | ICD-10-CM

## 2019-04-25 NOTE — Progress Notes (Signed)
Gynecologic Oncology Multi-Disciplinary Disposition Conference Note  Date of the Conference: 04/25/2019  Patient Name: [name]  Referring Provider: Dr. Jana Hakim Primary GYN Oncologist: Dr. Denman George  Stage/Disposition:  Stage IIIC clear cell ovarian cancer. Disposition is to radiation followed by chemotherapy with platinum duoublet or observaton.   This Multidisciplinary conference took place involving physicians from [facility], [place], Radiation Oncology, Pathology, Radiology along with the Gynecologic Oncology Nurse Practitioner and RN.  Comprehensive assessment of the patient's malignancy, staging, need for surgery, chemotherapy, radiation therapy, and need for further testing were reviewed. Supportive measures, both inpatient and following discharge were also discussed. The recommended plan of care is documented. Greater than 35 minutes were spent correlating and coordinating this patient's care.

## 2019-04-25 NOTE — Telephone Encounter (Signed)
[name] and discussed [name]'s appointment with Dr. Sondra Come.  He said he is aware of the appointment date and time.

## 2019-05-03 ENCOUNTER — Other Ambulatory Visit: Payer: Self-pay | Admitting: Oncology

## 2019-05-03 ENCOUNTER — Telehealth: Payer: Self-pay | Admitting: *Deleted

## 2019-05-03 NOTE — Telephone Encounter (Signed)
CALLED PATIENT TO ALTER APPT. ON 05-12-19, PER JILL, RN FOR DR. KINARD, SPOKE WITH PATIENT'S HUSBAND-[name] AND HE AGREED TO COME IN ON 05-12-19 - 1:30 PM - NURSE AND 2 PM - DR. Sondra Come

## 2019-05-09 ENCOUNTER — Other Ambulatory Visit: Payer: Self-pay | Admitting: Oncology

## 2019-05-09 DIAGNOSIS — Z1231 Encounter for screening mammogram for malignant neoplasm of breast: Secondary | ICD-10-CM

## 2019-05-09 MED FILL — TOREMIFENE CITRATE 60 MG TA: 60 | 90 days supply | Qty: 90 | Fill #3

## 2019-05-12 ENCOUNTER — Ambulatory Visit
Admission: RE | Admit: 2019-05-12 | Discharge: 2019-05-12 | Disposition: A | Discharge status: Home / Self Care | Payer: Medicare Other | Source: Ambulatory Visit | Attending: Radiation Oncology | Admitting: Radiation Oncology

## 2019-05-12 ENCOUNTER — Encounter: Payer: Self-pay | Admitting: Radiation Oncology

## 2019-05-12 ENCOUNTER — Other Ambulatory Visit: Payer: Self-pay

## 2019-05-12 VITALS — BP 124/78 | HR 87 | Temp 98.7°F | Resp 18 | Ht 65.0 in

## 2019-05-12 DIAGNOSIS — I110 Hypertensive heart disease with heart failure: Secondary | ICD-10-CM | POA: Diagnosis not present

## 2019-05-12 DIAGNOSIS — Z79899 Other long term (current) drug therapy: Secondary | ICD-10-CM | POA: Insufficient documentation

## 2019-05-12 DIAGNOSIS — Z510 Encounter for antineoplastic radiation therapy: Secondary | ICD-10-CM | POA: Diagnosis not present

## 2019-05-12 DIAGNOSIS — Z90722 Acquired absence of ovaries, bilateral: Secondary | ICD-10-CM | POA: Diagnosis not present

## 2019-05-12 DIAGNOSIS — I480 Paroxysmal atrial fibrillation: Secondary | ICD-10-CM | POA: Insufficient documentation

## 2019-05-12 DIAGNOSIS — Z170 Estrogen receptor positive status [ER+]: Secondary | ICD-10-CM | POA: Insufficient documentation

## 2019-05-12 DIAGNOSIS — C772 Secondary and unspecified malignant neoplasm of intra-abdominal lymph nodes: Secondary | ICD-10-CM

## 2019-05-12 DIAGNOSIS — Z7984 Long term (current) use of oral hypoglycemic drugs: Secondary | ICD-10-CM | POA: Diagnosis not present

## 2019-05-12 DIAGNOSIS — Z86718 Personal history of other venous thrombosis and embolism: Secondary | ICD-10-CM | POA: Diagnosis not present

## 2019-05-12 DIAGNOSIS — E785 Hyperlipidemia, unspecified: Secondary | ICD-10-CM | POA: Insufficient documentation

## 2019-05-12 DIAGNOSIS — E039 Hypothyroidism, unspecified: Secondary | ICD-10-CM | POA: Insufficient documentation

## 2019-05-12 DIAGNOSIS — Z87891 Personal history of nicotine dependence: Secondary | ICD-10-CM | POA: Insufficient documentation

## 2019-05-12 DIAGNOSIS — Z8543 Personal history of malignant neoplasm of ovary: Secondary | ICD-10-CM | POA: Insufficient documentation

## 2019-05-12 DIAGNOSIS — E1136 Type 2 diabetes mellitus with diabetic cataract: Secondary | ICD-10-CM | POA: Diagnosis not present

## 2019-05-12 DIAGNOSIS — C562 Malignant neoplasm of left ovary: Secondary | ICD-10-CM | POA: Insufficient documentation

## 2019-05-12 DIAGNOSIS — F329 Major depressive disorder, single episode, unspecified: Secondary | ICD-10-CM | POA: Insufficient documentation

## 2019-05-12 DIAGNOSIS — C50811 Malignant neoplasm of overlapping sites of right female breast: Secondary | ICD-10-CM | POA: Insufficient documentation

## 2019-05-12 DIAGNOSIS — Z9011 Acquired absence of right breast and nipple: Secondary | ICD-10-CM | POA: Insufficient documentation

## 2019-05-12 DIAGNOSIS — Z9221 Personal history of antineoplastic chemotherapy: Secondary | ICD-10-CM | POA: Insufficient documentation

## 2019-05-12 DIAGNOSIS — K219 Gastro-esophageal reflux disease without esophagitis: Secondary | ICD-10-CM | POA: Insufficient documentation

## 2019-05-12 DIAGNOSIS — M129 Arthropathy, unspecified: Secondary | ICD-10-CM | POA: Insufficient documentation

## 2019-05-12 DIAGNOSIS — Z90710 Acquired absence of both cervix and uterus: Secondary | ICD-10-CM | POA: Insufficient documentation

## 2019-05-12 DIAGNOSIS — Z923 Personal history of irradiation: Secondary | ICD-10-CM | POA: Diagnosis not present

## 2019-05-12 DIAGNOSIS — I5042 Chronic combined systolic (congestive) and diastolic (congestive) heart failure: Secondary | ICD-10-CM | POA: Diagnosis not present

## 2019-05-12 NOTE — Patient Instructions (Signed)
Coronavirus (COVID-19) Are you at risk?  Are you at risk for the Coronavirus (COVID-19)?  To be considered HIGH RISK for Coronavirus (COVID-19), you have to meet the following criteria:  . Traveled to [place], [place], [name], [place] or [place]; or in the [place] to [place], [place], [place], or [place]; and have fever, cough, and shortness of breath within the last 2 weeks of travel OR . Been in close contact with a person diagnosed with COVID-19 within the last 2 weeks and have fever, cough, and shortness of breath . IF YOU DO NOT MEET THESE CRITERIA, YOU ARE CONSIDERED LOW RISK FOR COVID-19.  What to do if you are HIGH RISK for COVID-19?  . If you are having a medical emergency, call 911. . Seek medical care right away. Before you go to a doctor's office, urgent care or emergency department, call ahead and tell them about your recent travel, contact with someone diagnosed with COVID-19, and your symptoms. You should receive instructions from your physician's office regarding next steps of care.  . When you arrive at healthcare provider, tell the healthcare staff immediately you have returned from visiting [place], [place], [place], [place] or [name]; or traveled in the [place] to [place], [place], [place], or [place]; in the last two weeks or you have been in close contact with a person diagnosed with COVID-19 in the last 2 weeks.   . Tell the health care staff about your symptoms: fever, cough and shortness of breath. . After you have been seen by a medical provider, you will be either: o Tested for (COVID-19) and discharged home on quarantine except to seek medical care if symptoms worsen, and asked to  - Stay home and avoid contact with others until you get your results (4-5 days)  - Avoid travel on public transportation if possible (such as bus, train, or airplane) or o Sent to the Emergency Department by EMS for evaluation, COVID-19 testing, and possible  admission depending on your condition and test results.  What to do if you are LOW RISK for COVID-19?  Reduce your risk of any infection by using the same precautions used for avoiding the common cold or flu:  . Wash your hands often with soap and warm water for at least 20 seconds.  If soap and water are not readily available, use an alcohol-based hand sanitizer with at least 60% alcohol.  . If coughing or sneezing, cover your mouth and nose by coughing or sneezing into the elbow areas of your shirt or coat, into a tissue or into your sleeve (not your hands). . Avoid shaking hands with others and consider head nods or verbal greetings only. . Avoid touching your eyes, nose, or mouth with unwashed hands.  . Avoid close contact with people who are sick. . Avoid places or events with large numbers of people in one location, like concerts or sporting events. . Carefully consider travel plans you have or are making. . If you are planning any travel outside or inside the [place], visit the CDC's Travelers' Health webpage for the latest health notices. . If you have some symptoms but not all symptoms, continue to monitor at home and seek medical attention if your symptoms worsen. . If you are having a medical emergency, call 911.   ADDITIONAL HEALTHCARE OPTIONS FOR PATIENTS  [facility] / e-Visit: [email or web address]         [facility]: [phone]  [facility]: [phone]                   [facility]: [phone]   
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
## 2019-05-12 NOTE — Progress Notes (Signed)
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 female who is accompanied by her husband. The patient has been followed by Dr. Jana Hakim for breast cancer, diagnosed in 2013.  In summary, she is s/p right mastectomy for stage IIIA (pT3, pN1a) invasive ductal carcinoma of overlapping sites, ER+ [name] /Her2-, grade 2. She was treated with two cycles of chemotherapy, post-mastectomy radiation, and 5 years of letrozole.  She was also diagnosed with ovarian cancer in 2016. She is s/p abdominal hysterectomy, BSO, para-aortic lymphadenectomy, omentectomy, and radical tumor debulking for stage IIIC (pT1c, pN1) clear cell left ovarian cancer. She received one cycle of chemotherapy consisting of carboplatin and paclitaxel and then received 12 doses of abraxane.  Since that time, PET scan performed on 04/02/2018 revealed hypermetabolic retroperitoneal adenopathy. Dr. Jana Hakim followed her Ca 125 for this, and she was started on toremifene and palbociclib in 08/2018. Repeat PET scan, performed on 04/11/2019, revealed progression of the retroperitoneal lymphadenopathy.   PREVIOUS RADIATION THERAPY: Yes  11/16/12 - 12/24/12: (Dr. Pablo Ledger) Right chest wall/ 43.2 Gray @ 1.8 Pearline Cables per fraction x 24 fractions Right supraclavicular fossa / 45 Gy _0 .8 Gy per fraction x 25 fractions Right drain sites / 40 Gy @ 2 Gy per fraction x 20 fractions Right scar boost / 10  Gray at [employer] per fraction x 5 fractions   PAST MEDICAL HISTORY:  Past Medical History:  Diagnosis Date  . Allergy   . Anemia    low iron hx  . Arthritis   . Breast cancer ([place]) 2014   a. 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 Cataract    removed ou  . Chronic combined systolic and diastolic CHF (congestive heart failure) ([place])    a. 08/2012 Echo: EF 55-60%, no rwma, mod MR;  b. 07/2014 Echo: EF 45-50%, distal antsept HK, mod TR/MR, mildly bil-atrial enlargement.  . Clear Cell Ovarian Cancer    a. 06/2014 s/p TAH/BSO debulking/lymph node dissection;  b. Now on chemo.  . Depression   . Diabetes mellitus   . DJD (degenerative joint disease) of lumbar spine   . DVT (deep venous thrombosis) ([facility])    hx of on HRT left leg ~2006  . Gallstones   . GERD (gastroesophageal reflux disease)   . Hyperlipidemia   . Hypertension   . Hypothyroidism   . Liver disease, chronic, with cirrhosis ([place])    ? autoimmune  . OSA on CPAP    cpap 4.5 setting  . Paroxysmal atrial fibrillation ([facility])    a. CHA2DS2VASc = 4-->coumadin.  [name] Physical deconditioning   . PPD positive, treated    rx inh   . Sleep apnea     PAST SURGICAL HISTORY: Past Surgical History:  Procedure Laterality Date  . ABDOMINAL HYSTERECTOMY N/A 06/27/2014   Procedure: HYSTERECTOMY ABDOMINAL TOTAL;  Surgeon: Everitt Amber, MD;  Location: [facility];  Service: Gynecology;  Laterality: N/A;  . BREAST BIOPSY     left breast  . BREAST EXCISIONAL BIOPSY Left 2002  . CATARACT EXTRACTION    . CHOLECYSTECTOMY    . EYE SURGERY    . FOOT SURGERY    . history of chemotherapy x 2 treatments, radiation tx  2014  . LAPAROTOMY N/A 06/27/2014   Procedure: EXPLORATORY LAPAROTOMY;  Surgeon: Everitt Amber, MD;  Location: [facility];  Service: Gynecology;  Laterality: N/A;  . MASTECTOMY Right 2014  . MASTECTOMY MODIFIED RADICAL  06/08/2012    Procedure: MASTECTOMY MODIFIED RADICAL;  Surgeon: Edward Jolly, MD;  Location: [facility];  Service: General;  Laterality: Right;  . pac removed  2015  . PEG TUBE PLACEMENT     peg removed 2015  . PERCUTANEOUS LIVER BIOPSY    . PORTACATH PLACEMENT  06/08/2012   Procedure: INSERTION PORT-A-CATH;  Surgeon: Edward Jolly, MD;  Location: [facility];  Service: General;  Laterality: Left;  . SALPINGOOPHORECTOMY Bilateral 06/27/2014   Procedure: BILATERAL SALPINGO OOPHORECTOMY/TUMOR DEBULKING/LYMPHNODE DISSECTION, OMENTECTOMY;  Surgeon: Everitt Amber, MD;  Location: [facility];  Service: Gynecology;  Laterality: Bilateral;  . TUBAL LIGATION      FAMILY HISTORY:  Family History  Problem Relation Age of Onset  . Diabetes Mother   . Hypertension Mother   . Arthritis Mother   . Heart disease Mother   . Heart failure Mother   . Other Mother        benign breast mass  . Stroke Father   . Heart disease Father   . Dementia Father   . Diabetes Paternal Grandmother   . Colon cancer Paternal Grandfather        dx. 70s-80s  . Colon cancer Maternal Grandfather   . Other Daughter        potentially has had negative BRCA testing  . Other Daughter        hysterectomy; potentially has had negative genetic testing  . Rectal cancer Neg Hx   . Esophageal cancer Neg Hx   . Liver cancer Neg Hx     SOCIAL HISTORY:  Social History   Tobacco Use  . Smoking status: Former Smoker    Packs/day: 1.00    Years: 1.00    Pack years: 1.00    Quit date: 12/09/1998    Years since quitting: 20.4  . Smokeless tobacco: Former Network engineer Use Topics  . Alcohol use: No  . Drug use: No    Comment: quit age 73 only smoked as a teen 1 year    ALLERGIES:  Allergies  Allergen Reactions  . Olmesartan Medoxomil Cough  . Adhesive [Tape] Rash  . Tetracycline Hcl Other (See Comments)  . Venlafaxine Other (See Comments)    severe dry mouth    MEDICATIONS:  Current Outpatient Medications  Medication Sig  Dispense Refill  . acetaminophen (TYLENOL) 500 MG tablet Take 500 mg by mouth every 6 (six) hours as needed for moderate pain.    [name] ALPRAZolam (XANAX) 0.5 MG tablet Take 1 tablet (0.5 mg total) by mouth 2 (two) times daily as needed. for anxiety 18 tablet 0  . Blood Glucose Monitoring Suppl (ONETOUCH VERIO) w/Device KIT Use as directed twice a day.  Dx code: E11.9 1 kit 0  . buPROPion (WELLBUTRIN XL) 150 MG 24 hr tablet TAKE 1 TABLET BY MOUTH  DAILY 90 tablet 1  . diazepam (VALIUM) 5 MG tablet Pt to take 1 tab  1 hour before MRI and may repeat if needed 2 tablet 0  . escitalopram (LEXAPRO) 20 MG tablet TAKE 1 TABLET BY MOUTH IN  THE MORNING 90 tablet 3  . furosemide (LASIX) 20 MG tablet TAKE 1 TABLET BY MOUTH  DAILY 90 tablet 3  . glucose blood (ONETOUCH VERIO) test strip USE TO CHECK BLOOD SUGAR  TWO TIMES DAILY. Dx Code: E11.9 100 each 1  . levothyroxine (SYNTHROID) 150 MCG tablet TAKE 1 TABLET BY MOUTH  DAILY 90 tablet 3  . lidocaine-prilocaine (EMLA) cream APPLY TOPICALLY OVER PORT  AREA 1-2 HOURS BEFORE  CHEMOTHERAPY AS NEEDED 30 g 1  . LORazepam (ATIVAN) 0.5 MG tablet Take 1 tablet (0.5 mg total) by mouth every 8 (eight) hours. Ativan 0.30m - Take 1 tablet prior to MRI imaging - may repeat if needed for anxiety. 30 tablet 0  . metFORMIN (GLUCOPHAGE-XR) 500 MG 24 hr tablet TAKE 2 TABLETS BY MOUTH  EVERY EVENING 180 tablet 3  . OLANZapine (ZYPREXA) 2.5 MG tablet TAKE 1 TABLET BY MOUTH IN  THE EVENING AT BEDTIME 90 tablet 3  . omeprazole (PRILOSEC) 20 MG capsule Take 1 capsule (20 mg total) by mouth daily. 90 capsule 3  . ondansetron (ZOFRAN) 8 MG tablet TAKE 1 TABLET BY MOUTH  EVERY 8 HOURS AS NEEDED FOR NAUSEA OR VOMITING 90 tablet 4  . OneTouch Delica Lancets 309XMISC Use to check blood sugar twice a day.  DX Code: E11.9 200 each 1  . palbociclib (IBRANCE) 100 MG tablet Take 1 tablet (100 mg total) by mouth daily. Take for 21 days on, 7 days off, repeat every 28 days. 21 tablet 6  . potassium  chloride (K-DUR) 10 MEQ tablet TAKE 1 TABLET BY MOUTH  TWICE DAILY 180 tablet 3  . pravastatin (PRAVACHOL) 10 MG tablet TAKE 1 TABLET BY MOUTH  DAILY 90 tablet 3  . propranolol (INDERAL) 20 MG tablet TAKE 1 TABLET BY MOUTH TWO  TIMES DAILY 180 tablet 3  . Toremifene Citrate (FARESTON) 60 MG tablet Take 1 tablet (60 mg total) by mouth daily. 90 tablet 4   No current facility-administered medications for this encounter.    REVIEW OF SYSTEMS:  A 10+ POINT REVIEW OF SYSTEMS WAS OBTAINED including neurology, dermatology, psychiatry, cardiac, respiratory, lymph, extremities, GI, GU, musculoskeletal, constitutional, reproductive, HEENT. She denies pain today, though she is unable to stand and has to use a wheelchair due to issues with her knees.   PHYSICAL EXAM:  height is 5' 5" (1.651 m). Her temporal temperature is 98.7 F (37.1 C). Her blood pressure is 124/78 and her pulse is 87. Her respiration is 18 and oxygen saturation is 98%.   General: Alert and oriented, in no acute distress HEENT: Head is normocephalic. Extraocular movements are intact. Oropharynx is clear. Neck: Neck is supple, no palpable cervical or supraclavicular lymphadenopathy. Heart: Regular in rate and rhythm with no murmurs, rubs, or gallops. Chest: Clear to auscultation bilaterally, with no rhonchi, wheezes, or rales. Abdomen: Soft, nontender, nondistended, with no rigidity or guarding. Extremities: No cyanosis or edema. Lymphatics: see Neck Exam Skin: No concerning lesions. Musculoskeletal: symmetric strength and muscle tone throughout. Neurologic: Cranial nerves II through XII are grossly intact. No obvious focalities. Speech is fluent. Coordination is intact. Psychiatric: Judgment and insight are intact. Affect is appropriate. Right chest wall area reveals a mastectomy scar without palpable or visible signs of recurrence.  The patient has significant difficulty with standing and walking due to knee issues  ECOG = 2  0 -  Asymptomatic (Fully active, able to carry on all predisease activities without restriction)  1 - Symptomatic but completely ambulatory (Restricted in physically strenuous activity but ambulatory and able to carry out work of a light or sedentary nature. For example, light housework, office work)  2 - Symptomatic, <50% in bed during the day (Ambulatory and capable of all self care but unable to carry out any work activities. Up and about more than 50% of waking hours)  3 - Symptomatic, >50% in bed, but not bedbound (Capable of only limited self-care, confined to bed or chair 50% or more of waking hours)  4 - Bedbound (Completely disabled. Cannot carry on any self-care. Totally confined to bed or chair)  5 - Death   Eustace Pen MM, Creech RH, Tormey DC, et al. [phone]). "Toxicity and response criteria of the [facility]". [place] Oncol. 5 (6): 649-55  LABORATORY DATA:  Lab Results  Component Value Date   WBC 2.3 (L) 04/20/2019   HGB 9.3 (L) 04/20/2019   HCT 27.7 (L) 04/20/2019   MCV 117.9 (H) 04/20/2019   PLT 35 (L) 04/20/2019   NEUTROABS 1.6 (L) 04/20/2019   Lab Results  Component Value Date   NA 139 04/20/2019   K 3.5 04/20/2019   CL 110 04/20/2019   CO2 23 04/20/2019   GLUCOSE 180 (H) 04/20/2019   CREATININE 0.65 04/20/2019   CALCIUM 7.8 (L) 04/20/2019      RADIOGRAPHY: No results found.    IMPRESSION: Stage IIIC (pT1c, pN1) Clear Cell Left Ovarian Cancer  Patient has been found to have isolated recurrence in the periaortic nodal chain.  Given his limited area of recurrence patient would be a candidate for radiation therapy directed at this region.  Discussed the general course of radiation therapy side effects and potential toxicities of radiation therapy in this situation with the patient and her husband.  She appears to understand and wishes to proceed with planned course of treatment.    PLAN: Patient will proceed with CT simulation later today and  treatments to begin next week.  Anticipate 28 treatments (56 Gy) directed at the PET positive lymphadenopathy along the periaortic nodal chain.    ------------------------------------------------  Blair Promise, PhD, MD  This document serves as a record of services personally performed by Gery Pray, MD. It was created on his behalf by Wilburn Mylar, a trained medical scribe. The creation of this record is based on the scribe's personal observations and the provider's statements to them. This document has been checked and approved by the attending provider.

## 2019-05-15 NOTE — Progress Notes (Signed)
11/16/12 - 12/24/12: (Dr. Pablo Ledger) Right chest wall/ 43.2 Gray @ 1.8 Pearline Cables per fraction x 24 fractions Right supraclavicular fossa / 45 Gy @1 .8 Gy per fraction x 25 fractions Right drain sites / 40 Gy @ 2 Gy per fraction x 20 fractions Right scar boost / 10 Gray at [employer] per fraction x 5 fractions   Patient has been found to have isolated recurrence in the periaortic nodal chain.  Given his limited area of recurrence patient would be a candidate for radiation therapy directed at this region.  Discussed the general course of radiation therapy side effects and potential toxicities of radiation therapy in this situation with the patient and her husband.  She appears to understand and wishes to proceed with planned course of treatment.    PLAN: Patient will proceed with CT simulation later today and treatments to begin next week.  Anticipate 28 treatments (56 Gy) directed at the PET positive lymphadenopathy along the periaortic nodal chain.

## 2019-05-15 NOTE — Progress Notes (Signed)
  Radiation Oncology         ([phone]) [phone] ________________________________  Name: [name] MRN: [id number]  Date: 05/12/2019  DOB: 12/28/1955  SIMULATION AND TREATMENT PLANNING NOTE    ICD-10-CM   1. Secondary malignancy of para-aortic lymph node ([facility])  C77.2     DIAGNOSIS:  Stage IIIC (pT1c, pN1) Clear Cell Left Ovarian Cancer  NARRATIVE:  The patient was brought to the [facility].  Identity was confirmed.  All relevant records and images related to the planned course of therapy were reviewed.  The patient freely provided informed written consent to proceed with treatment after reviewing the details related to the planned course of therapy. The consent form was witnessed and verified by the simulation staff.  Then, the patient was set-up in a stable reproducible  supine position for radiation therapy.  CT images were obtained.  Surface markings were placed.  The CT images were loaded into the planning software.  Then the target and avoidance structures were contoured.  Treatment planning then occurred.  The radiation prescription was entered and confirmed.  Then, I designed and supervised the construction of a total of 4 medically necessary complex treatment devices.  I have requested : Intensity Modulated Radiotherapy (IMRT) is medically necessary for this case for the following reason:  Small bowel sparing and kidney sparing..  I have ordered:dose calc.  PLAN:  The patient will receive 56 Gy in 28 fractions.  -----------------------------------  Blair Promise, PhD, MD
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
## 2019-05-18 ENCOUNTER — Telehealth: Payer: Self-pay | Admitting: Oncology

## 2019-05-18 ENCOUNTER — Inpatient Hospital Stay: Payer: Medicare Other

## 2019-05-18 ENCOUNTER — Other Ambulatory Visit: Payer: Self-pay

## 2019-05-18 ENCOUNTER — Inpatient Hospital Stay: Payer: Medicare Other | Attending: Adult Health | Admitting: Oncology

## 2019-05-18 VITALS — BP 131/81 | HR 90 | Temp 98.7°F | Resp 18 | Ht 65.0 in | Wt 147.6 lb

## 2019-05-18 DIAGNOSIS — Z87891 Personal history of nicotine dependence: Secondary | ICD-10-CM | POA: Diagnosis not present

## 2019-05-18 DIAGNOSIS — Z90710 Acquired absence of both cervix and uterus: Secondary | ICD-10-CM | POA: Insufficient documentation

## 2019-05-18 DIAGNOSIS — Z7189 Other specified counseling: Secondary | ICD-10-CM

## 2019-05-18 DIAGNOSIS — Z79899 Other long term (current) drug therapy: Secondary | ICD-10-CM | POA: Insufficient documentation

## 2019-05-18 DIAGNOSIS — Z7984 Long term (current) use of oral hypoglycemic drugs: Secondary | ICD-10-CM | POA: Insufficient documentation

## 2019-05-18 DIAGNOSIS — D6959 Other secondary thrombocytopenia: Secondary | ICD-10-CM | POA: Insufficient documentation

## 2019-05-18 DIAGNOSIS — C569 Malignant neoplasm of unspecified ovary: Secondary | ICD-10-CM

## 2019-05-18 DIAGNOSIS — F329 Major depressive disorder, single episode, unspecified: Secondary | ICD-10-CM | POA: Insufficient documentation

## 2019-05-18 DIAGNOSIS — C50811 Malignant neoplasm of overlapping sites of right female breast: Secondary | ICD-10-CM

## 2019-05-18 DIAGNOSIS — Z9011 Acquired absence of right breast and nipple: Secondary | ICD-10-CM | POA: Diagnosis not present

## 2019-05-18 DIAGNOSIS — E785 Hyperlipidemia, unspecified: Secondary | ICD-10-CM | POA: Diagnosis not present

## 2019-05-18 DIAGNOSIS — E119 Type 2 diabetes mellitus without complications: Secondary | ICD-10-CM | POA: Insufficient documentation

## 2019-05-18 DIAGNOSIS — Z86718 Personal history of other venous thrombosis and embolism: Secondary | ICD-10-CM | POA: Insufficient documentation

## 2019-05-18 DIAGNOSIS — Z853 Personal history of malignant neoplasm of breast: Secondary | ICD-10-CM | POA: Diagnosis present

## 2019-05-18 DIAGNOSIS — I480 Paroxysmal atrial fibrillation: Secondary | ICD-10-CM | POA: Insufficient documentation

## 2019-05-18 DIAGNOSIS — Z7901 Long term (current) use of anticoagulants: Secondary | ICD-10-CM | POA: Diagnosis not present

## 2019-05-18 DIAGNOSIS — G4733 Obstructive sleep apnea (adult) (pediatric): Secondary | ICD-10-CM | POA: Insufficient documentation

## 2019-05-18 DIAGNOSIS — C561 Malignant neoplasm of right ovary: Secondary | ICD-10-CM

## 2019-05-18 DIAGNOSIS — Z90722 Acquired absence of ovaries, bilateral: Secondary | ICD-10-CM | POA: Diagnosis not present

## 2019-05-18 DIAGNOSIS — M8580 Other specified disorders of bone density and structure, unspecified site: Secondary | ICD-10-CM | POA: Insufficient documentation

## 2019-05-18 DIAGNOSIS — M818 Other osteoporosis without current pathological fracture: Secondary | ICD-10-CM

## 2019-05-18 DIAGNOSIS — E039 Hypothyroidism, unspecified: Secondary | ICD-10-CM | POA: Diagnosis not present

## 2019-05-18 DIAGNOSIS — Z170 Estrogen receptor positive status [ER+]: Secondary | ICD-10-CM | POA: Diagnosis not present

## 2019-05-18 DIAGNOSIS — C562 Malignant neoplasm of left ovary: Secondary | ICD-10-CM

## 2019-05-18 DIAGNOSIS — Z95828 Presence of other vascular implants and grafts: Secondary | ICD-10-CM

## 2019-05-18 LAB — COMPREHENSIVE METABOLIC PANEL
ALT: 12 U/L (ref 0–44)
AST: 29 U/L (ref 15–41)
Albumin: 2.8 g/dL — ABNORMAL LOW (ref 3.5–5.0)
Alkaline Phosphatase: 114 U/L (ref 38–126)
Anion gap: 9 (ref 5–15)
BUN: 5 mg/dL — ABNORMAL LOW (ref 8–23)
CO2: 22 mmol/L (ref 22–32)
Calcium: 8 mg/dL — ABNORMAL LOW (ref 8.9–10.3)
Chloride: 109 mmol/L (ref 98–111)
Creatinine, Ser: 0.61 mg/dL (ref 0.44–1.00)
GFR calc Af Amer: >60 mL/min (ref >60)
GFR calc non Af Amer: >60 mL/min (ref >60)
Glucose, Bld: 81 mg/dL (ref 70–99)
Potassium: 3.8 mmol/L (ref 3.5–5.1)
Sodium: 140 mmol/L (ref 135–145)
Total Bilirubin: 1.4 mg/dL — ABNORMAL HIGH (ref 0.3–1.2)
Total Protein: 6.8 g/dL (ref 6.5–8.1)

## 2019-05-18 LAB — CBC WITH DIFFERENTIAL/PLATELET
Abs Immature Granulocytes: 0.02 10*3/uL (ref 0.00–0.07)
Basophils Absolute: 0 10*3/uL (ref 0.0–0.1)
Basophils Relative: 1 %
Eosinophils Absolute: 0.2 10*3/uL (ref 0.0–0.5)
Eosinophils Relative: 4 %
HCT: 34.1 % — ABNORMAL LOW (ref 36.0–46.0)
Hemoglobin: 10.9 g/dL — ABNORMAL LOW (ref 12.0–15.0)
Immature Granulocytes: 0 %
Lymphocytes Relative: 15 %
Lymphs Abs: 0.7 10*3/uL (ref 0.7–4.0)
MCH: 36.2 pg — ABNORMAL HIGH (ref 26.0–34.0)
MCHC: 32 g/dL (ref 30.0–36.0)
MCV: 113.3 fL — ABNORMAL HIGH (ref 80.0–100.0)
Monocytes Absolute: 0.4 10*3/uL (ref 0.1–1.0)
Monocytes Relative: 7 %
Neutro Abs: 3.5 10*3/uL (ref 1.7–7.7)
Neutrophils Relative %: 73 %
Platelets: 69 10*3/uL — ABNORMAL LOW (ref 150–400)
RBC: 3.01 MIL/uL — ABNORMAL LOW (ref 3.87–5.11)
RDW: 13.6 % (ref 11.5–15.5)
WBC: 4.9 10*3/uL (ref 4.0–10.5)
nRBC: 0 % (ref 0.0–0.2)

## 2019-05-18 MED ORDER — HEPARIN SOD (PORK) LOCK FLUSH 100 UNIT/ML IV SOLN
500.0000 [IU] | Freq: Once | INTRAVENOUS | Status: AC
  Administered 2019-05-18: 500 [IU]
  Filled 2019-05-18: qty 5

## 2019-05-18 MED ORDER — SODIUM CHLORIDE 0.9% FLUSH
10.0000 mL | Freq: Once | INTRAVENOUS | Status: AC
  Administered 2019-05-18: 10 mL
  Filled 2019-05-18: qty 10

## 2019-05-18 NOTE — Progress Notes (Signed)
Able to get blood return from port but not enough blood to draw labs. Patient sent back to lab to get blood drawn from arm

## 2019-05-18 NOTE — Telephone Encounter (Signed)
I talk with patient regarding schedule  

## 2019-05-19 DIAGNOSIS — C772 Secondary and unspecified malignant neoplasm of intra-abdominal lymph nodes: Secondary | ICD-10-CM | POA: Insufficient documentation

## 2019-05-19 DIAGNOSIS — C562 Malignant neoplasm of left ovary: Secondary | ICD-10-CM | POA: Insufficient documentation

## 2019-05-19 DIAGNOSIS — Z510 Encounter for antineoplastic radiation therapy: Secondary | ICD-10-CM | POA: Diagnosis not present

## 2019-05-19 LAB — CA 125: Cancer Antigen (CA) 125: 84.1 U/mL — ABNORMAL HIGH (ref 0.0–38.1)

## 2019-05-23 ENCOUNTER — Ambulatory Visit
Admission: RE | Admit: 2019-05-23 | Discharge: 2019-05-23 | Disposition: A | Discharge status: Home / Self Care | Payer: Medicare Other | Source: Ambulatory Visit | Attending: Radiation Oncology | Admitting: Radiation Oncology

## 2019-05-23 ENCOUNTER — Other Ambulatory Visit: Payer: Self-pay

## 2019-05-23 DIAGNOSIS — C772 Secondary and unspecified malignant neoplasm of intra-abdominal lymph nodes: Secondary | ICD-10-CM

## 2019-05-23 DIAGNOSIS — Z510 Encounter for antineoplastic radiation therapy: Secondary | ICD-10-CM | POA: Diagnosis not present

## 2019-05-23 NOTE — Progress Notes (Signed)
  Radiation Oncology         [phone] ________________________________  Name: [name] MRN: [id number]  Date: 05/23/2019  DOB: [birth year]  Simulation Verification Note    ICD-10-CM   1. 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 The clinical setup was verified. Then port films were obtained and uploaded to the radiation oncology medical record software.  The treatment beams were carefully compared against the planned radiation fields. The position location and shape of the radiation fields was reviewed. They targeted volume of tissue appears to be appropriately covered by the radiation beams. Organs at risk appear to be excluded as planned.  Based on my personal review, I approved the simulation verification. The patient's treatment will proceed as planned.  -----------------------------------  Blair Promise, PhD, MD

## 2019-05-24 ENCOUNTER — Other Ambulatory Visit: Payer: Self-pay

## 2019-05-24 ENCOUNTER — Ambulatory Visit
Admission: RE | Admit: 2019-05-24 | Discharge: 2019-05-24 | Disposition: A | Discharge status: Home / Self Care | Payer: Medicare Other | Source: Ambulatory Visit | Attending: Radiation Oncology | Admitting: Radiation Oncology

## 2019-05-24 DIAGNOSIS — Z510 Encounter for antineoplastic radiation therapy: Secondary | ICD-10-CM | POA: Diagnosis not present

## 2019-05-25 ENCOUNTER — Other Ambulatory Visit: Payer: Self-pay

## 2019-05-25 ENCOUNTER — Ambulatory Visit
Admission: RE | Admit: 2019-05-25 | Discharge: 2019-05-25 | Disposition: A | Discharge status: Home / Self Care | Payer: Medicare Other | Source: Ambulatory Visit | Attending: Radiation Oncology | Admitting: Radiation Oncology

## 2019-05-25 DIAGNOSIS — Z510 Encounter for antineoplastic radiation therapy: Secondary | ICD-10-CM | POA: Diagnosis not present

## 2019-05-26 ENCOUNTER — Other Ambulatory Visit: Payer: Self-pay

## 2019-05-26 ENCOUNTER — Ambulatory Visit
Admission: RE | Admit: 2019-05-26 | Discharge: 2019-05-26 | Disposition: A | Discharge status: Home / Self Care | Payer: Medicare Other | Source: Ambulatory Visit | Attending: Radiation Oncology | Admitting: Radiation Oncology

## 2019-05-26 DIAGNOSIS — Z510 Encounter for antineoplastic radiation therapy: Secondary | ICD-10-CM | POA: Diagnosis not present

## 2019-05-27 ENCOUNTER — Other Ambulatory Visit: Payer: Self-pay

## 2019-05-27 ENCOUNTER — Ambulatory Visit
Admission: RE | Admit: 2019-05-27 | Discharge: 2019-05-27 | Disposition: A | Discharge status: Home / Self Care | Payer: Medicare Other | Source: Ambulatory Visit | Attending: Radiation Oncology | Admitting: Radiation Oncology

## 2019-05-27 DIAGNOSIS — Z510 Encounter for antineoplastic radiation therapy: Secondary | ICD-10-CM | POA: Diagnosis not present

## 2019-05-30 ENCOUNTER — Other Ambulatory Visit: Payer: Self-pay | Admitting: Radiation Therapy

## 2019-05-30 ENCOUNTER — Other Ambulatory Visit: Payer: Self-pay

## 2019-05-30 ENCOUNTER — Ambulatory Visit
Admission: RE | Admit: 2019-05-30 | Discharge: 2019-05-30 | Disposition: A | Discharge status: Home / Self Care | Payer: Medicare Other | Source: Ambulatory Visit | Attending: Radiation Oncology | Admitting: Radiation Oncology

## 2019-05-30 DIAGNOSIS — Z510 Encounter for antineoplastic radiation therapy: Secondary | ICD-10-CM | POA: Diagnosis not present

## 2019-05-31 ENCOUNTER — Other Ambulatory Visit: Payer: Self-pay

## 2019-05-31 ENCOUNTER — Ambulatory Visit
Admission: RE | Admit: 2019-05-31 | Discharge: 2019-05-31 | Disposition: A | Discharge status: Home / Self Care | Payer: Medicare Other | Source: Ambulatory Visit | Attending: Radiation Oncology | Admitting: Radiation Oncology

## 2019-05-31 DIAGNOSIS — Z510 Encounter for antineoplastic radiation therapy: Secondary | ICD-10-CM | POA: Diagnosis not present

## 2019-06-01 ENCOUNTER — Ambulatory Visit: Payer: Medicare Other

## 2019-06-01 ENCOUNTER — Other Ambulatory Visit: Payer: Self-pay

## 2019-06-01 ENCOUNTER — Ambulatory Visit
Admission: RE | Admit: 2019-06-01 | Discharge: 2019-06-01 | Disposition: A | Discharge status: Home / Self Care | Payer: Medicare Other | Source: Ambulatory Visit | Attending: Radiation Oncology | Admitting: Radiation Oncology

## 2019-06-01 DIAGNOSIS — Z510 Encounter for antineoplastic radiation therapy: Secondary | ICD-10-CM | POA: Diagnosis not present

## 2019-06-01 NOTE — Telephone Encounter (Signed)
No entry 

## 2019-06-02 ENCOUNTER — Ambulatory Visit
Admission: RE | Admit: 2019-06-02 | Discharge: 2019-06-02 | Disposition: A | Discharge status: Home / Self Care | Payer: Medicare Other | Source: Ambulatory Visit | Attending: Radiation Oncology | Admitting: Radiation Oncology

## 2019-06-02 ENCOUNTER — Other Ambulatory Visit: Payer: Self-pay

## 2019-06-02 DIAGNOSIS — Z510 Encounter for antineoplastic radiation therapy: Secondary | ICD-10-CM | POA: Diagnosis not present

## 2019-06-03 ENCOUNTER — Ambulatory Visit
Admission: RE | Admit: 2019-06-03 | Discharge: 2019-06-03 | Disposition: A | Discharge status: Home / Self Care | Payer: Medicare Other | Source: Ambulatory Visit | Attending: Radiation Oncology | Admitting: Radiation Oncology

## 2019-06-03 ENCOUNTER — Other Ambulatory Visit: Payer: Self-pay

## 2019-06-03 DIAGNOSIS — Z510 Encounter for antineoplastic radiation therapy: Secondary | ICD-10-CM | POA: Diagnosis not present

## 2019-06-04 ENCOUNTER — Ambulatory Visit: Payer: Medicare Other | Attending: Internal Medicine

## 2019-06-04 DIAGNOSIS — Z23 Encounter for immunization: Secondary | ICD-10-CM

## 2019-06-04 NOTE — Progress Notes (Signed)
   Covid-19 Vaccination Clinic  Name:  [name]    MRN: [id number] DOB: [birth year]  06/04/2019  [name] was observed post Covid-19 immunization for 30 minutes based on pre-vaccination screening without incidence. She was provided with Vaccine Information Sheet and instruction to access the V-Safe system.   [name] was instructed to call 911 with any severe reactions post vaccine: [name] Difficulty breathing  . Swelling of your face and throat  . A fast heartbeat  . A bad rash all over your body  . Dizziness and weakness    Immunizations Administered    Name Date Dose VIS Date Route   Pfizer COVID-19 Vaccine 06/04/2019 10:54 AM 0.3 mL 04/22/2019 Intramuscular   Manufacturer: [place]   Lot: [id number]   [place]: [id number]

## 2019-06-06 ENCOUNTER — Ambulatory Visit
Admission: RE | Admit: 2019-06-06 | Discharge: 2019-06-06 | Disposition: A | Discharge status: Home / Self Care | Payer: Medicare Other | Source: Ambulatory Visit | Attending: Radiation Oncology | Admitting: Radiation Oncology

## 2019-06-06 ENCOUNTER — Other Ambulatory Visit: Payer: Self-pay

## 2019-06-06 DIAGNOSIS — Z510 Encounter for antineoplastic radiation therapy: Secondary | ICD-10-CM | POA: Diagnosis not present

## 2019-06-07 ENCOUNTER — Inpatient Hospital Stay: Payer: Medicare Other

## 2019-06-07 ENCOUNTER — Ambulatory Visit
Admission: RE | Admit: 2019-06-07 | Discharge: 2019-06-07 | Disposition: A | Discharge status: Home / Self Care | Payer: Medicare Other | Source: Ambulatory Visit | Attending: Radiation Oncology | Admitting: Radiation Oncology

## 2019-06-07 ENCOUNTER — Other Ambulatory Visit: Payer: Self-pay

## 2019-06-07 DIAGNOSIS — Z510 Encounter for antineoplastic radiation therapy: Secondary | ICD-10-CM | POA: Diagnosis not present

## 2019-06-08 ENCOUNTER — Other Ambulatory Visit: Payer: Self-pay

## 2019-06-08 ENCOUNTER — Ambulatory Visit
Admission: RE | Admit: 2019-06-08 | Discharge: 2019-06-08 | Disposition: A | Discharge status: Home / Self Care | Payer: Medicare Other | Source: Ambulatory Visit | Attending: Radiation Oncology | Admitting: Radiation Oncology

## 2019-06-08 DIAGNOSIS — Z510 Encounter for antineoplastic radiation therapy: Secondary | ICD-10-CM | POA: Diagnosis not present

## 2019-06-09 ENCOUNTER — Ambulatory Visit
Admission: RE | Admit: 2019-06-09 | Discharge: 2019-06-09 | Disposition: A | Discharge status: Home / Self Care | Payer: Medicare Other | Source: Ambulatory Visit | Attending: Radiation Oncology | Admitting: Radiation Oncology

## 2019-06-09 ENCOUNTER — Other Ambulatory Visit: Payer: Self-pay

## 2019-06-09 DIAGNOSIS — Z510 Encounter for antineoplastic radiation therapy: Secondary | ICD-10-CM | POA: Diagnosis not present

## 2019-06-10 ENCOUNTER — Ambulatory Visit
Admission: RE | Admit: 2019-06-10 | Discharge: 2019-06-10 | Disposition: A | Discharge status: Home / Self Care | Payer: Medicare Other | Source: Ambulatory Visit | Attending: Radiation Oncology | Admitting: Radiation Oncology

## 2019-06-10 ENCOUNTER — Other Ambulatory Visit: Payer: Self-pay

## 2019-06-10 DIAGNOSIS — Z510 Encounter for antineoplastic radiation therapy: Secondary | ICD-10-CM | POA: Diagnosis not present

## 2019-06-13 ENCOUNTER — Other Ambulatory Visit: Payer: Self-pay

## 2019-06-13 ENCOUNTER — Ambulatory Visit: Payer: Medicare Other | Admitting: Internal Medicine

## 2019-06-13 ENCOUNTER — Other Ambulatory Visit: Payer: Self-pay | Admitting: Family Medicine

## 2019-06-13 ENCOUNTER — Ambulatory Visit
Admission: RE | Admit: 2019-06-13 | Discharge: 2019-06-13 | Disposition: A | Discharge status: Home / Self Care | Payer: Medicare Other | Source: Ambulatory Visit | Attending: Radiation Oncology | Admitting: Radiation Oncology

## 2019-06-13 DIAGNOSIS — C772 Secondary and unspecified malignant neoplasm of intra-abdominal lymph nodes: Secondary | ICD-10-CM | POA: Diagnosis not present

## 2019-06-13 DIAGNOSIS — Z510 Encounter for antineoplastic radiation therapy: Secondary | ICD-10-CM | POA: Insufficient documentation

## 2019-06-13 DIAGNOSIS — C562 Malignant neoplasm of left ovary: Secondary | ICD-10-CM | POA: Insufficient documentation

## 2019-06-13 DIAGNOSIS — N393 Stress incontinence (female) (male): Secondary | ICD-10-CM

## 2019-06-14 ENCOUNTER — Other Ambulatory Visit: Payer: Self-pay

## 2019-06-14 ENCOUNTER — Ambulatory Visit
Admission: RE | Admit: 2019-06-14 | Discharge: 2019-06-14 | Disposition: A | Discharge status: Home / Self Care | Payer: Medicare Other | Source: Ambulatory Visit | Attending: Radiation Oncology | Admitting: Radiation Oncology

## 2019-06-14 DIAGNOSIS — C772 Secondary and unspecified malignant neoplasm of intra-abdominal lymph nodes: Secondary | ICD-10-CM | POA: Diagnosis not present

## 2019-06-14 DIAGNOSIS — Z510 Encounter for antineoplastic radiation therapy: Secondary | ICD-10-CM | POA: Diagnosis not present

## 2019-06-15 ENCOUNTER — Other Ambulatory Visit: Payer: Self-pay

## 2019-06-15 ENCOUNTER — Ambulatory Visit
Admission: RE | Admit: 2019-06-15 | Discharge: 2019-06-15 | Disposition: A | Discharge status: Home / Self Care | Payer: Medicare Other | Source: Ambulatory Visit | Attending: Radiation Oncology | Admitting: Radiation Oncology

## 2019-06-15 DIAGNOSIS — C772 Secondary and unspecified malignant neoplasm of intra-abdominal lymph nodes: Secondary | ICD-10-CM | POA: Diagnosis not present

## 2019-06-15 DIAGNOSIS — Z510 Encounter for antineoplastic radiation therapy: Secondary | ICD-10-CM | POA: Diagnosis not present

## 2019-06-16 ENCOUNTER — Other Ambulatory Visit: Payer: Self-pay

## 2019-06-16 ENCOUNTER — Ambulatory Visit
Admission: RE | Admit: 2019-06-16 | Discharge: 2019-06-16 | Disposition: A | Discharge status: Home / Self Care | Payer: Medicare Other | Source: Ambulatory Visit | Attending: Radiation Oncology | Admitting: Radiation Oncology

## 2019-06-16 DIAGNOSIS — Z510 Encounter for antineoplastic radiation therapy: Secondary | ICD-10-CM | POA: Diagnosis not present

## 2019-06-16 DIAGNOSIS — C772 Secondary and unspecified malignant neoplasm of intra-abdominal lymph nodes: Secondary | ICD-10-CM | POA: Diagnosis not present

## 2019-06-17 ENCOUNTER — Other Ambulatory Visit: Payer: Self-pay

## 2019-06-17 ENCOUNTER — Other Ambulatory Visit: Payer: Self-pay | Admitting: Radiation Therapy

## 2019-06-17 ENCOUNTER — Ambulatory Visit
Admission: RE | Admit: 2019-06-17 | Discharge: 2019-06-17 | Disposition: A | Discharge status: Home / Self Care | Payer: Medicare Other | Source: Ambulatory Visit | Attending: Radiation Oncology | Admitting: Radiation Oncology

## 2019-06-17 DIAGNOSIS — C772 Secondary and unspecified malignant neoplasm of intra-abdominal lymph nodes: Secondary | ICD-10-CM | POA: Diagnosis not present

## 2019-06-17 DIAGNOSIS — Z510 Encounter for antineoplastic radiation therapy: Secondary | ICD-10-CM | POA: Diagnosis not present

## 2019-06-20 ENCOUNTER — Ambulatory Visit
Admission: RE | Admit: 2019-06-20 | Discharge: 2019-06-20 | Disposition: A | Discharge status: Home / Self Care | Payer: Medicare Other | Source: Ambulatory Visit | Attending: Radiation Oncology | Admitting: Radiation Oncology

## 2019-06-20 DIAGNOSIS — C772 Secondary and unspecified malignant neoplasm of intra-abdominal lymph nodes: Secondary | ICD-10-CM | POA: Diagnosis not present

## 2019-06-20 DIAGNOSIS — Z510 Encounter for antineoplastic radiation therapy: Secondary | ICD-10-CM | POA: Diagnosis not present

## 2019-06-21 ENCOUNTER — Ambulatory Visit
Admission: RE | Admit: 2019-06-21 | Discharge: 2019-06-21 | Disposition: A | Discharge status: Home / Self Care | Payer: Medicare Other | Source: Ambulatory Visit | Attending: Radiation Oncology | Admitting: Radiation Oncology

## 2019-06-21 ENCOUNTER — Other Ambulatory Visit: Payer: Self-pay

## 2019-06-21 ENCOUNTER — Inpatient Hospital Stay: Payer: Medicare Other | Attending: Adult Health

## 2019-06-21 ENCOUNTER — Telehealth: Payer: Self-pay | Admitting: Adult Health

## 2019-06-21 DIAGNOSIS — Z510 Encounter for antineoplastic radiation therapy: Secondary | ICD-10-CM | POA: Diagnosis not present

## 2019-06-21 DIAGNOSIS — C772 Secondary and unspecified malignant neoplasm of intra-abdominal lymph nodes: Secondary | ICD-10-CM | POA: Diagnosis not present

## 2019-06-21 NOTE — Telephone Encounter (Signed)
Called and spoke with patient husband to inform them that [name] will see Dr. Hilma Favors on Friday to discuss advanced care planning and support available for [name] considering her living situation challenges, knee pain, and metastatic ovarian and breast cancer.    Rhonda Bihari, NP

## 2019-06-22 ENCOUNTER — Ambulatory Visit
Admission: RE | Admit: 2019-06-22 | Discharge: 2019-06-22 | Disposition: A | Discharge status: Home / Self Care | Payer: Medicare Other | Source: Ambulatory Visit | Attending: Radiation Oncology | Admitting: Radiation Oncology

## 2019-06-22 ENCOUNTER — Other Ambulatory Visit: Payer: Self-pay

## 2019-06-22 ENCOUNTER — Other Ambulatory Visit: Payer: Self-pay | Admitting: Oncology

## 2019-06-22 ENCOUNTER — Ambulatory Visit
Admission: RE | Admit: 2019-06-22 | Discharge: 2019-06-22 | Disposition: A | Discharge status: Home / Self Care | Payer: Medicare Other | Source: Ambulatory Visit | Attending: Oncology | Admitting: Oncology

## 2019-06-22 ENCOUNTER — Telehealth: Payer: Self-pay

## 2019-06-22 DIAGNOSIS — Z1231 Encounter for screening mammogram for malignant neoplasm of breast: Secondary | ICD-10-CM | POA: Diagnosis not present

## 2019-06-22 DIAGNOSIS — C772 Secondary and unspecified malignant neoplasm of intra-abdominal lymph nodes: Secondary | ICD-10-CM | POA: Diagnosis not present

## 2019-06-22 DIAGNOSIS — Z510 Encounter for antineoplastic radiation therapy: Secondary | ICD-10-CM | POA: Diagnosis not present

## 2019-06-22 NOTE — Telephone Encounter (Signed)
Oral Oncology Patient Advocate Encounter   Was successful in securing patient an $40 grant from [facility]) to provide copayment coverage for Ibrance.  This will keep the out of pocket expense at $0.       The billing information is as follows and has been shared with [place].   Member ID: [id number]  Group ID: [id number] RxBin: [id number] Dates of Eligibility: 03/24/19 through 06/20/20  Fund:  [place] [facility] [place] Phone [phone] Fax [phone] 06/22/2019 2:32 PM

## 2019-06-23 ENCOUNTER — Other Ambulatory Visit: Payer: Self-pay

## 2019-06-23 ENCOUNTER — Ambulatory Visit: Payer: Medicare Other

## 2019-06-23 DIAGNOSIS — Z510 Encounter for antineoplastic radiation therapy: Secondary | ICD-10-CM | POA: Diagnosis not present

## 2019-06-23 DIAGNOSIS — C772 Secondary and unspecified malignant neoplasm of intra-abdominal lymph nodes: Secondary | ICD-10-CM | POA: Diagnosis not present

## 2019-06-24 ENCOUNTER — Ambulatory Visit: Payer: Medicare Other | Attending: Family Medicine

## 2019-06-24 ENCOUNTER — Ambulatory Visit: Payer: Medicare Other

## 2019-06-24 ENCOUNTER — Other Ambulatory Visit: Payer: Self-pay

## 2019-06-24 DIAGNOSIS — Z510 Encounter for antineoplastic radiation therapy: Secondary | ICD-10-CM | POA: Diagnosis not present

## 2019-06-24 DIAGNOSIS — C772 Secondary and unspecified malignant neoplasm of intra-abdominal lymph nodes: Secondary | ICD-10-CM | POA: Diagnosis not present

## 2019-06-25 ENCOUNTER — Ambulatory Visit: Payer: Medicare Other | Attending: Internal Medicine

## 2019-06-25 DIAGNOSIS — Z23 Encounter for immunization: Secondary | ICD-10-CM | POA: Insufficient documentation

## 2019-06-25 NOTE — Progress Notes (Signed)
   Covid-19 Vaccination Clinic  Name:  COSETTA QU    MRN: [id number] DOB: [birth year]  06/25/2019  Ms. Battie was observed post Covid-19 immunization for 15 minutes without incidence. She was provided with Vaccine Information Sheet and instruction to access the V-Safe system.   Ms. Lebaron was instructed to call 911 with any severe reactions post vaccine: [name] Difficulty breathing  . Swelling of your face and throat  . A fast heartbeat  . A bad rash all over your body  . Dizziness and weakness    Immunizations Administered    Name Date Dose VIS Date Route   Pfizer COVID-19 Vaccine 06/25/2019  8:56 AM 0.3 mL 04/22/2019 Intramuscular   Manufacturer: [place]   Lot: [id number]   [place]: [id number]

## 2019-06-27 ENCOUNTER — Ambulatory Visit
Admission: RE | Admit: 2019-06-27 | Discharge: 2019-06-27 | Disposition: A | Discharge status: Home / Self Care | Payer: Medicare Other | Source: Ambulatory Visit | Attending: Radiation Oncology | Admitting: Radiation Oncology

## 2019-06-27 ENCOUNTER — Other Ambulatory Visit: Payer: Self-pay

## 2019-06-27 DIAGNOSIS — C772 Secondary and unspecified malignant neoplasm of intra-abdominal lymph nodes: Secondary | ICD-10-CM | POA: Diagnosis not present

## 2019-06-27 DIAGNOSIS — Z510 Encounter for antineoplastic radiation therapy: Secondary | ICD-10-CM | POA: Diagnosis not present

## 2019-06-28 ENCOUNTER — Other Ambulatory Visit: Payer: Self-pay

## 2019-06-28 ENCOUNTER — Ambulatory Visit
Admission: RE | Admit: 2019-06-28 | Discharge: 2019-06-28 | Disposition: A | Discharge status: Home / Self Care | Payer: Medicare Other | Source: Ambulatory Visit | Attending: Radiation Oncology | Admitting: Radiation Oncology

## 2019-06-28 DIAGNOSIS — C772 Secondary and unspecified malignant neoplasm of intra-abdominal lymph nodes: Secondary | ICD-10-CM | POA: Diagnosis not present

## 2019-06-28 DIAGNOSIS — Z510 Encounter for antineoplastic radiation therapy: Secondary | ICD-10-CM | POA: Diagnosis not present

## 2019-06-29 ENCOUNTER — Encounter: Payer: Self-pay | Admitting: Radiation Oncology

## 2019-06-29 ENCOUNTER — Other Ambulatory Visit: Payer: Self-pay

## 2019-06-29 ENCOUNTER — Ambulatory Visit
Admission: RE | Admit: 2019-06-29 | Discharge: 2019-06-29 | Disposition: A | Discharge status: Home / Self Care | Payer: Medicare Other | Source: Ambulatory Visit | Attending: Radiation Oncology | Admitting: Radiation Oncology

## 2019-06-29 DIAGNOSIS — Z510 Encounter for antineoplastic radiation therapy: Secondary | ICD-10-CM | POA: Diagnosis not present

## 2019-06-29 DIAGNOSIS — C772 Secondary and unspecified malignant neoplasm of intra-abdominal lymph nodes: Secondary | ICD-10-CM | POA: Diagnosis not present

## 2019-07-11 NOTE — Progress Notes (Incomplete)
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 In the beginning, she reported some mild fatigue, one episode of nausea, poor appetite, and occasional diarrhea that was relieved by Imodium. All symptoms improved throughout treatment. She denied vomiting, dysuria, skin changes, and abdominal pain.  Plan: The patient will follow-up with radiation oncology in one month.  ________________________________________________   Blair Promise, PhD, MD  This document serves as a record of services personally performed by Gery Pray, MD. It was created on his behalf by Clerance Lav, a trained medical scribe. The creation of this record is based on the scribe's personal observations and the provider's statements to them. This document has been checked and approved by the attending provider.

## 2019-07-13 ENCOUNTER — Telehealth: Payer: Self-pay

## 2019-07-13 ENCOUNTER — Other Ambulatory Visit: Payer: Self-pay

## 2019-07-13 DIAGNOSIS — K7460 Unspecified cirrhosis of liver: Secondary | ICD-10-CM

## 2019-07-13 NOTE — Progress Notes (Signed)
Pt due for RUQ ultrasound for [facility] screening in March 2021

## 2019-07-13 NOTE — Telephone Encounter (Signed)
Called and scheduled pt for RUQ ultrasound at [facility] on Friday, 07-29-19 at 8:00am, to arrive at 7:45am, NPO 6 hours prior.  Confirmed appt with pt and Mr. Clausen.

## 2019-07-13 NOTE — Telephone Encounter (Signed)
-----   Message from Roetta Sessions, [place] sent at 03/08/2019  9:45 AM EDT ----- Regarding: due for RUQ U/S in March Pt due for RUQ U/S in March 2021 (cirrhosis)

## 2019-07-15 ENCOUNTER — Other Ambulatory Visit: Payer: Self-pay | Admitting: Oncology

## 2019-07-18 ENCOUNTER — Telehealth: Payer: Self-pay | Admitting: Oncology

## 2019-07-18 NOTE — Telephone Encounter (Signed)
Rescheduled 3/16 appts to 3/26 per 3/8 sch msg. Pt's husband is aware of new appt date and time.

## 2019-07-21 ENCOUNTER — Other Ambulatory Visit: Payer: Self-pay | Admitting: Oncology

## 2019-07-21 MED ORDER — ALPRAZOLAM 0.5 MG PO TABS: 0.5000 mg | ORAL_TABLET | Freq: Two times a day (BID) | ORAL | 0 refills | Status: DC | PRN

## 2019-07-26 ENCOUNTER — Other Ambulatory Visit: Payer: Medicare Other

## 2019-07-26 ENCOUNTER — Ambulatory Visit: Payer: Medicare Other | Admitting: Oncology

## 2019-07-27 ENCOUNTER — Ambulatory Visit (HOSPITAL_COMMUNITY): Payer: No Typology Code available for payment source

## 2019-07-27 NOTE — Progress Notes (Signed)
Radiation Oncology         [phone] ________________________________  Name: [name] MRN: [id number]  Date: 07/28/2019  DOB: [birth year]  Follow-Up Visit Note  CC: Ann Held, DO  Ann Held, *    ICD-10-CM   1. 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 Pablo Ledger) Right chest wall/ 43.2 Pearline Cables @ 1.8 Pearline Cables per fraction x 24 fractions Right supraclavicular fossa / 45 Gy _0 .8 Gy per fraction x 25 fractions Right drain sites / 40 Gy @ 2 Gy per fraction x 20 fractions Right scar boost / 10 Gray at [employer] per fraction x 5 fractions  Narrative:  The patient returns today for routine follow-up. She is scheduled for abdominal ultrasound on 07/29/2019 and follow up with Dr. Jana Hakim on 08/05/2019.  Her most recent screening mammogram from 06/22/2019 was negative.  On review of systems, she reports fatigue is resolving since XRT has completed. Pt denies c/o pain. Pt reports good appetite. Pt denies diarrhea/constipation. Pt denies N/V.  ALLERGIES:  is allergic to olmesartan medoxomil; adhesive [tape]; tetracycline hcl; and venlafaxine.  Meds: Current Outpatient Medications  Medication Sig Dispense Refill  . acetaminophen (TYLENOL) 500 MG tablet Take 500 mg by mouth every 6 (six) hours as needed for moderate pain.    [name] ALPRAZolam (XANAX) 0.5 MG tablet Take 1 tablet (0.5 mg total) by mouth 2 (two) times daily as needed. for anxiety 180 tablet 0  . Blood Glucose Monitoring Suppl (ONETOUCH VERIO) w/Device KIT Use as directed twice a day.  Dx code: E11.9 1 kit 0  .  buPROPion (WELLBUTRIN XL) 150 MG 24 hr tablet TAKE 1 TABLET BY MOUTH  DAILY 90 tablet 3  . diazepam (VALIUM) 5 MG tablet Pt to take 1 tab 1 hour before MRI and may repeat if needed 2 tablet 0  . escitalopram (LEXAPRO) 20 MG tablet TAKE 1 TABLET BY MOUTH IN  THE MORNING 90 tablet 3  . furosemide (LASIX) 20 MG tablet TAKE 1 TABLET BY MOUTH  DAILY 90 tablet 3  . glucose blood (ONETOUCH VERIO) test strip USE TO CHECK BLOOD SUGAR  TWO TIMES DAILY. Dx Code: E11.9 100 each 1  . levothyroxine (SYNTHROID) 150 MCG tablet TAKE 1 TABLET BY MOUTH  DAILY 90 tablet 3  . lidocaine-prilocaine (EMLA) cream APPLY TOPICALLY OVER PORT  AREA 1-2 HOURS BEFORE  CHEMOTHERAPY AS NEEDED 30 g 1  . LORazepam (ATIVAN) 0.5 MG tablet Take 1 tablet (0.5 mg total) by mouth every 8 (eight) hours. Ativan 0.68m - Take 1 tablet prior to MRI imaging - may repeat if needed for anxiety. 30 tablet 0  . metFORMIN (GLUCOPHAGE-XR) 500 MG 24 hr tablet TAKE 2 TABLETS BY MOUTH  EVERY EVENING 180 tablet 3  . mirabegron ER (MYRBETRIQ) 25 MG TB24 tablet Myrbetriq 25 mg tablet,extended release  TAKE 1 TABLET BY MOUTH DAILY    . OLANZapine (ZYPREXA) 2.5 MG tablet TAKE 1 TABLET BY MOUTH IN  THE EVENING AT BEDTIME 90 tablet 3  . omeprazole (PRILOSEC) 20 MG capsule Take  1 capsule (20 mg total) by mouth daily. 90 capsule 3  . ondansetron (ZOFRAN) 8 MG tablet TAKE 1 TABLET BY MOUTH  EVERY 8 HOURS AS NEEDED FOR NAUSEA OR VOMITING 90 tablet 4  . OneTouch Delica Lancets 33G MISC Use to check blood sugar twice a day.  DX Code: E11.9 200 each 1  . oxybutynin (DITROPAN) 5 MG tablet TAKE 1 TABLET BY MOUTH 3  TIMES DAILY 270 tablet 3  . palbociclib (IBRANCE) 100 MG tablet Take 1 tablet (100 mg total) by mouth daily. Take for 21 days on, 7 days off, repeat every 28 days. 21 tablet 6  . potassium chloride (K-DUR) 10 MEQ tablet TAKE 1 TABLET BY MOUTH  TWICE DAILY 180 tablet 3  . pravastatin (PRAVACHOL) 10 MG tablet TAKE 1 TABLET BY MOUTH  DAILY 90 tablet 3  .  propranolol (INDERAL) 20 MG tablet TAKE 1 TABLET BY MOUTH TWO  TIMES DAILY 180 tablet 3  . Toremifene Citrate (FARESTON) 60 MG tablet Take 1 tablet (60 mg total) by mouth daily. 90 tablet 4   No current facility-administered medications for this encounter.    Physical Findings: The patient is in no acute distress. Patient is alert and oriented.  height is 5' 5" (1.651 m). Her temporal temperature is 97.8 F (36.6 C). Her blood pressure is 110/54 (abnormal) and her pulse is 63. Her respiration is 18 and oxygen saturation is 98%. .   Lungs are clear to auscultation bilaterally. Heart has regular rate and rhythm. No palpable cervical, supraclavicular, or axillary adenopathy. Abdomen soft, non-tender, normal bowel sounds.   Lab Findings: Lab Results  Component Value Date   WBC 4.9 05/18/2019   HGB 10.9 (L) 05/18/2019   HCT 34.1 (L) 05/18/2019   MCV 113.3 (H) 05/18/2019   PLT 69 (L) 05/18/2019    Radiographic Findings: No results found.  Impression:  The patient is recovering from the effects of radiation.  Overall tolerated her periaortic radiation well.  Plan: Ultrasound of the right upper quadrant tomorrow.  Follow-up with medical oncology next week.  Routine follow-up in radiation oncology in 3 months.  She will likely undergo CT imaging 2 to 3 months after completion of her radiation therapy depending on medical oncology recommendations.  ____________________________________    D. , PhD, MD  This document serves as a record of services personally performed by  , MD. It was created on his behalf by Katie Daubenspeck, a trained medical scribe. The creation of this record is based on the scribe's personal observations and the provider's statements to them. This document has been checked and approved by the attending provider.  

## 2019-07-28 ENCOUNTER — Encounter: Payer: Self-pay | Admitting: Radiation Oncology

## 2019-07-28 ENCOUNTER — Other Ambulatory Visit: Payer: Self-pay

## 2019-07-28 ENCOUNTER — Ambulatory Visit
Admission: RE | Admit: 2019-07-28 | Discharge: 2019-07-28 | Disposition: A | Discharge status: Home / Self Care | Payer: Medicare Other | Source: Ambulatory Visit | Attending: Radiation Oncology | Admitting: Radiation Oncology

## 2019-07-28 VITALS — BP 110/54 | HR 63 | Temp 97.8°F | Resp 18 | Ht 65.0 in

## 2019-07-28 DIAGNOSIS — Z7984 Long term (current) use of oral hypoglycemic drugs: Secondary | ICD-10-CM | POA: Insufficient documentation

## 2019-07-28 DIAGNOSIS — C772 Secondary and unspecified malignant neoplasm of intra-abdominal lymph nodes: Secondary | ICD-10-CM | POA: Diagnosis not present

## 2019-07-28 DIAGNOSIS — Z923 Personal history of irradiation: Secondary | ICD-10-CM | POA: Diagnosis not present

## 2019-07-28 DIAGNOSIS — C50811 Malignant neoplasm of overlapping sites of right female breast: Secondary | ICD-10-CM | POA: Diagnosis not present

## 2019-07-28 DIAGNOSIS — Z8542 Personal history of malignant neoplasm of other parts of uterus: Secondary | ICD-10-CM | POA: Insufficient documentation

## 2019-07-28 DIAGNOSIS — R5383 Other fatigue: Secondary | ICD-10-CM | POA: Insufficient documentation

## 2019-07-28 DIAGNOSIS — Z170 Estrogen receptor positive status [ER+]: Secondary | ICD-10-CM | POA: Insufficient documentation

## 2019-07-28 DIAGNOSIS — Z79899 Other long term (current) drug therapy: Secondary | ICD-10-CM | POA: Diagnosis not present

## 2019-07-28 NOTE — Progress Notes (Signed)
[name] presents today for f/u with Dr. Sondra Come. Pt reports fatigue is resolving since XRT has completed. Pt denies c/o pain. Pt reports good appetite. Pt denies diarrhea/constipation. Pt denies N/V.  BP (!) 110/54 (BP Location: Left Arm, Patient Position: Sitting)   Pulse 63   Temp 97.8 F (36.6 C) (Temporal)   Resp 18   Ht 5\' 5"  (1.651 m)   SpO2 98%   BMI 24.56 kg/m   Wt Readings from Last 3 Encounters:  05/18/19 147 lb 9.6 oz (67 kg)  04/20/19 149 lb 6.4 oz (67.8 kg)  03/08/19 145 lb (65.8 kg)   Rhonda Sousa, RN BSN

## 2019-07-28 NOTE — Patient Instructions (Signed)
Coronavirus (COVID-19) Are you at risk?  Are you at risk for the Coronavirus (COVID-19)?  To be considered HIGH RISK for Coronavirus (COVID-19), you have to meet the following criteria:  . Traveled to [place], [place], [name], [place] or [place]; or in the [place] to [place], [place], [place], or [place]; and have fever, cough, and shortness of breath within the last 2 weeks of travel OR . Been in close contact with a person diagnosed with COVID-19 within the last 2 weeks and have fever, cough, and shortness of breath . IF YOU DO NOT MEET THESE CRITERIA, YOU ARE CONSIDERED LOW RISK FOR COVID-19.  What to do if you are HIGH RISK for COVID-19?  . If you are having a medical emergency, call 911. . Seek medical care right away. Before you go to a doctor's office, urgent care or emergency department, call ahead and tell them about your recent travel, contact with someone diagnosed with COVID-19, and your symptoms. You should receive instructions from your physician's office regarding next steps of care.  . When you arrive at healthcare provider, tell the healthcare staff immediately you have returned from visiting [place], [place], [place], [place] or [name]; or traveled in the [place] to [place], [place], [place], or [place]; in the last two weeks or you have been in close contact with a person diagnosed with COVID-19 in the last 2 weeks.   . Tell the health care staff about your symptoms: fever, cough and shortness of breath. . After you have been seen by a medical provider, you will be either: o Tested for (COVID-19) and discharged home on quarantine except to seek medical care if symptoms worsen, and asked to  - Stay home and avoid contact with others until you get your results (4-5 days)  - Avoid travel on public transportation if possible (such as bus, train, or airplane) or o Sent to the Emergency Department by EMS for evaluation, COVID-19 testing, and possible  admission depending on your condition and test results.  What to do if you are LOW RISK for COVID-19?  Reduce your risk of any infection by using the same precautions used for avoiding the common cold or flu:  . Wash your hands often with soap and warm water for at least 20 seconds.  If soap and water are not readily available, use an alcohol-based hand sanitizer with at least 60% alcohol.  . If coughing or sneezing, cover your mouth and nose by coughing or sneezing into the elbow areas of your shirt or coat, into a tissue or into your sleeve (not your hands). . Avoid shaking hands with others and consider head nods or verbal greetings only. . Avoid touching your eyes, nose, or mouth with unwashed hands.  . Avoid close contact with people who are sick. . Avoid places or events with large numbers of people in one location, like concerts or sporting events. . Carefully consider travel plans you have or are making. . If you are planning any travel outside or inside the [place], visit the CDC's Travelers' Health webpage for the latest health notices. . If you have some symptoms but not all symptoms, continue to monitor at home and seek medical attention if your symptoms worsen. . If you are having a medical emergency, call 911.   ADDITIONAL HEALTHCARE OPTIONS FOR PATIENTS  [facility] / e-Visit: [email or web address]         [facility]: [phone]  [facility]: [phone]                   [facility]: [phone]   

## 2019-07-29 ENCOUNTER — Ambulatory Visit (HOSPITAL_COMMUNITY)
Admission: RE | Admit: 2019-07-29 | Discharge: 2019-07-29 | Disposition: A | Discharge status: Home / Self Care | Payer: Medicare Other | Source: Ambulatory Visit | Attending: Gastroenterology | Admitting: Gastroenterology

## 2019-07-29 DIAGNOSIS — K7460 Unspecified cirrhosis of liver: Secondary | ICD-10-CM | POA: Insufficient documentation
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
## 2019-08-05 ENCOUNTER — Inpatient Hospital Stay: Payer: Medicare Other

## 2019-08-05 ENCOUNTER — Inpatient Hospital Stay: Payer: Medicare Other | Attending: Adult Health

## 2019-08-05 ENCOUNTER — Other Ambulatory Visit: Payer: Self-pay

## 2019-08-05 ENCOUNTER — Inpatient Hospital Stay (HOSPITAL_BASED_OUTPATIENT_CLINIC_OR_DEPARTMENT_OTHER): Payer: Medicare Other | Admitting: Oncology

## 2019-08-05 VITALS — BP 121/57 | HR 62 | Temp 98.0°F | Resp 18 | Ht 65.0 in | Wt 150.0 lb

## 2019-08-05 DIAGNOSIS — C50919 Malignant neoplasm of unspecified site of unspecified female breast: Secondary | ICD-10-CM | POA: Diagnosis not present

## 2019-08-05 DIAGNOSIS — Z7189 Other specified counseling: Secondary | ICD-10-CM

## 2019-08-05 DIAGNOSIS — Z9221 Personal history of antineoplastic chemotherapy: Secondary | ICD-10-CM | POA: Diagnosis not present

## 2019-08-05 DIAGNOSIS — G63 Polyneuropathy in diseases classified elsewhere: Secondary | ICD-10-CM

## 2019-08-05 DIAGNOSIS — C50811 Malignant neoplasm of overlapping sites of right female breast: Secondary | ICD-10-CM | POA: Diagnosis not present

## 2019-08-05 DIAGNOSIS — G4733 Obstructive sleep apnea (adult) (pediatric): Secondary | ICD-10-CM | POA: Insufficient documentation

## 2019-08-05 DIAGNOSIS — F329 Major depressive disorder, single episode, unspecified: Secondary | ICD-10-CM | POA: Insufficient documentation

## 2019-08-05 DIAGNOSIS — I110 Hypertensive heart disease with heart failure: Secondary | ICD-10-CM | POA: Insufficient documentation

## 2019-08-05 DIAGNOSIS — Z86718 Personal history of other venous thrombosis and embolism: Secondary | ICD-10-CM | POA: Insufficient documentation

## 2019-08-05 DIAGNOSIS — Z90722 Acquired absence of ovaries, bilateral: Secondary | ICD-10-CM | POA: Diagnosis not present

## 2019-08-05 DIAGNOSIS — I480 Paroxysmal atrial fibrillation: Secondary | ICD-10-CM | POA: Diagnosis not present

## 2019-08-05 DIAGNOSIS — C569 Malignant neoplasm of unspecified ovary: Secondary | ICD-10-CM | POA: Diagnosis present

## 2019-08-05 DIAGNOSIS — C562 Malignant neoplasm of left ovary: Secondary | ICD-10-CM

## 2019-08-05 DIAGNOSIS — Z87891 Personal history of nicotine dependence: Secondary | ICD-10-CM | POA: Diagnosis not present

## 2019-08-05 DIAGNOSIS — C801 Malignant (primary) neoplasm, unspecified: Secondary | ICD-10-CM | POA: Diagnosis not present

## 2019-08-05 DIAGNOSIS — Z8249 Family history of ischemic heart disease and other diseases of the circulatory system: Secondary | ICD-10-CM | POA: Diagnosis not present

## 2019-08-05 DIAGNOSIS — Z452 Encounter for adjustment and management of vascular access device: Secondary | ICD-10-CM | POA: Diagnosis not present

## 2019-08-05 DIAGNOSIS — C772 Secondary and unspecified malignant neoplasm of intra-abdominal lymph nodes: Secondary | ICD-10-CM

## 2019-08-05 DIAGNOSIS — Z170 Estrogen receptor positive status [ER+]: Secondary | ICD-10-CM

## 2019-08-05 DIAGNOSIS — Z9011 Acquired absence of right breast and nipple: Secondary | ICD-10-CM | POA: Diagnosis not present

## 2019-08-05 DIAGNOSIS — Z9079 Acquired absence of other genital organ(s): Secondary | ICD-10-CM | POA: Insufficient documentation

## 2019-08-05 DIAGNOSIS — Z7984 Long term (current) use of oral hypoglycemic drugs: Secondary | ICD-10-CM | POA: Insufficient documentation

## 2019-08-05 DIAGNOSIS — Z90710 Acquired absence of both cervix and uterus: Secondary | ICD-10-CM | POA: Diagnosis not present

## 2019-08-05 DIAGNOSIS — Z800 Family history of malignant neoplasm of digestive organs: Secondary | ICD-10-CM | POA: Diagnosis not present

## 2019-08-05 DIAGNOSIS — Z79899 Other long term (current) drug therapy: Secondary | ICD-10-CM | POA: Diagnosis not present

## 2019-08-05 DIAGNOSIS — E785 Hyperlipidemia, unspecified: Secondary | ICD-10-CM | POA: Diagnosis not present

## 2019-08-05 DIAGNOSIS — E119 Type 2 diabetes mellitus without complications: Secondary | ICD-10-CM | POA: Insufficient documentation

## 2019-08-05 DIAGNOSIS — Z923 Personal history of irradiation: Secondary | ICD-10-CM | POA: Diagnosis not present

## 2019-08-05 DIAGNOSIS — C561 Malignant neoplasm of right ovary: Secondary | ICD-10-CM

## 2019-08-05 DIAGNOSIS — G629 Polyneuropathy, unspecified: Secondary | ICD-10-CM | POA: Insufficient documentation

## 2019-08-05 DIAGNOSIS — Z853 Personal history of malignant neoplasm of breast: Secondary | ICD-10-CM | POA: Insufficient documentation

## 2019-08-05 DIAGNOSIS — Z95828 Presence of other vascular implants and grafts: Secondary | ICD-10-CM

## 2019-08-05 DIAGNOSIS — I5042 Chronic combined systolic (congestive) and diastolic (congestive) heart failure: Secondary | ICD-10-CM | POA: Diagnosis not present

## 2019-08-05 DIAGNOSIS — K760 Fatty (change of) liver, not elsewhere classified: Secondary | ICD-10-CM

## 2019-08-05 DIAGNOSIS — M818 Other osteoporosis without current pathological fracture: Secondary | ICD-10-CM

## 2019-08-05 LAB — CBC WITH DIFFERENTIAL/PLATELET
Abs Immature Granulocytes: 0.01 10*3/uL (ref 0.00–0.07)
Basophils Absolute: 0 10*3/uL (ref 0.0–0.1)
Basophils Relative: 1 %
Eosinophils Absolute: 0.1 10*3/uL (ref 0.0–0.5)
Eosinophils Relative: 5 %
HCT: 31.5 % — ABNORMAL LOW (ref 36.0–46.0)
Hemoglobin: 10.3 g/dL — ABNORMAL LOW (ref 12.0–15.0)
Immature Granulocytes: 0 %
Lymphocytes Relative: 9 %
Lymphs Abs: 0.3 10*3/uL — ABNORMAL LOW (ref 0.7–4.0)
MCH: 34.2 pg — ABNORMAL HIGH (ref 26.0–34.0)
MCHC: 32.7 g/dL (ref 30.0–36.0)
MCV: 104.7 fL — ABNORMAL HIGH (ref 80.0–100.0)
Monocytes Absolute: 0.2 10*3/uL (ref 0.1–1.0)
Monocytes Relative: 8 %
Neutro Abs: 2.2 10*3/uL (ref 1.7–7.7)
Neutrophils Relative %: 77 %
Platelets: 45 10*3/uL — ABNORMAL LOW (ref 150–400)
RBC: 3.01 MIL/uL — ABNORMAL LOW (ref 3.87–5.11)
RDW: 16.2 % — ABNORMAL HIGH (ref 11.5–15.5)
WBC: 2.9 10*3/uL — ABNORMAL LOW (ref 4.0–10.5)
nRBC: 0 % (ref 0.0–0.2)

## 2019-08-05 LAB — COMPREHENSIVE METABOLIC PANEL
ALT: 12 U/L (ref 0–44)
AST: 32 U/L (ref 15–41)
Albumin: 2.5 g/dL — ABNORMAL LOW (ref 3.5–5.0)
Alkaline Phosphatase: 92 U/L (ref 38–126)
Anion gap: 8 (ref 5–15)
BUN: 6 mg/dL — ABNORMAL LOW (ref 8–23)
CO2: 22 mmol/L (ref 22–32)
Calcium: 7.7 mg/dL — ABNORMAL LOW (ref 8.9–10.3)
Chloride: 110 mmol/L (ref 98–111)
Creatinine, Ser: 0.6 mg/dL (ref 0.44–1.00)
GFR calc Af Amer: >60 mL/min (ref >60)
GFR calc non Af Amer: >60 mL/min (ref >60)
Glucose, Bld: 142 mg/dL — ABNORMAL HIGH (ref 70–99)
Potassium: 3.7 mmol/L (ref 3.5–5.1)
Sodium: 140 mmol/L (ref 135–145)
Total Bilirubin: 1.2 mg/dL (ref 0.3–1.2)
Total Protein: 5.9 g/dL — ABNORMAL LOW (ref 6.5–8.1)

## 2019-08-05 MED ORDER — SODIUM CHLORIDE 0.9% FLUSH
10.0000 mL | Freq: Once | INTRAVENOUS | Status: AC
  Administered 2019-08-05: 10 mL
  Filled 2019-08-05: qty 10

## 2019-08-05 MED ORDER — BUPROPION HCL ER (XL) 300 MG PO TB24: 300.0000 mg | ORAL_TABLET | Freq: Every day | ORAL | 4 refills | Status: DC

## 2019-08-05 MED ORDER — HEPARIN SOD (PORK) LOCK FLUSH 100 UNIT/ML IV SOLN
500.0000 [IU] | Freq: Once | INTRAVENOUS | Status: AC
  Administered 2019-08-05: 500 [IU]
  Filled 2019-08-05: qty 5

## 2019-08-05 MED ORDER — TOREMIFENE CITRATE 60 MG PO TABS: 60.0000 mg | ORAL_TABLET | Freq: Every day | ORAL | 4 refills | Status: AC

## 2019-08-05 MED ORDER — PALBOCICLIB 100 MG PO TABS: 100.0000 mg | ORAL_TABLET | Freq: Every day | ORAL | 6 refills | Status: DC

## 2019-08-05 NOTE — Patient Instructions (Signed)

## 2019-08-05 NOTE — Addendum Note (Signed)
Addended by: Chauncey Cruel on: 08/05/2019 10:06 AM   Modules accepted: Orders

## 2019-08-06 LAB — CA 125: Cancer Antigen (CA) 125: 116 U/mL — ABNORMAL HIGH (ref 0.0–38.1)

## 2019-08-08 ENCOUNTER — Telehealth: Payer: Self-pay | Admitting: *Deleted

## 2019-08-08 NOTE — Telephone Encounter (Signed)
Records for referral faxed to [facility] physical therapy for pt to see Laural Golden. Reference release [id number] for information.

## 2019-08-09 MED FILL — TOREMIFENE CITRATE 60 MG TA: 60 | 30 days supply | Qty: 30 | Fill #0

## 2019-08-09 MED FILL — IBRANCE 100 MG TABS: 100 | 28 days supply | Qty: 21 | Fill #0

## 2019-08-22 ENCOUNTER — Telehealth: Payer: Self-pay

## 2019-08-22 DIAGNOSIS — R5381 Other malaise: Secondary | ICD-10-CM | POA: Diagnosis not present

## 2019-08-22 DIAGNOSIS — R2689 Other abnormalities of gait and mobility: Secondary | ICD-10-CM | POA: Diagnosis not present

## 2019-08-22 DIAGNOSIS — R531 Weakness: Secondary | ICD-10-CM | POA: Diagnosis not present

## 2019-08-22 NOTE — Telephone Encounter (Signed)
Oral Oncology Patient Advocate Encounter   Was successful in securing patient a $4000 grant from [place] to provide copayment coverage for Ibrance.  This will keep the out of pocket expense at $0.      The billing information is as follows and has been shared with [place].   Member ID: [id number] Group ID: [id number] RxBin: [id number] PCN: [id number] Dates of Eligibility: 08/22/19 through 08/21/20  Fund name:  Breast.  [place] Patient [place] Phone [phone] Fax [phone] 08/22/2019 3:11 PM

## 2019-08-26 DIAGNOSIS — R531 Weakness: Secondary | ICD-10-CM | POA: Diagnosis not present

## 2019-08-26 DIAGNOSIS — R2689 Other abnormalities of gait and mobility: Secondary | ICD-10-CM | POA: Diagnosis not present

## 2019-08-26 DIAGNOSIS — R5381 Other malaise: Secondary | ICD-10-CM | POA: Diagnosis not present

## 2019-08-29 DIAGNOSIS — R5381 Other malaise: Secondary | ICD-10-CM | POA: Diagnosis not present

## 2019-08-29 DIAGNOSIS — R2689 Other abnormalities of gait and mobility: Secondary | ICD-10-CM | POA: Diagnosis not present

## 2019-08-29 DIAGNOSIS — R531 Weakness: Secondary | ICD-10-CM | POA: Diagnosis not present

## 2019-09-06 MED FILL — IBRANCE 100 MG TABS: 100 | 28 days supply | Qty: 21 | Fill #1

## 2019-09-06 MED FILL — TOREMIFENE CITRATE 60 MG TA: 60 | 30 days supply | Qty: 30 | Fill #1

## 2019-09-07 DIAGNOSIS — M170 Bilateral primary osteoarthritis of knee: Secondary | ICD-10-CM | POA: Diagnosis not present

## 2019-09-08 ENCOUNTER — Ambulatory Visit: Payer: Medicare Other | Admitting: Gastroenterology

## 2019-09-15 ENCOUNTER — Other Ambulatory Visit: Payer: Self-pay | Admitting: Gastroenterology

## 2019-09-25 NOTE — Progress Notes (Signed)
[place]  [phone] [phone]    ID: [name]   DOB: June 18, 1955  MR#: [id number]  WLN#:[id number]  Patient Care Team: [name], Rhonda Apa, DO as PCP - General (Family Medicine) [name], Rhonda Dad, MD as Consulting Physician (Oncology) Rhonda Seltzer, MD (Inactive) as Consulting Physician (General Surgery) Rhonda Amber, MD as Consulting Physician (Obstetrics and Gynecology) Rhonda Arabian, MD as Consulting Physician (Orthopedic Surgery) [name], Rhonda Raspberry, MD as Consulting Physician (Gastroenterology) Garald Balding, MD as Consulting Physician (Orthopedic Surgery)  OTHER MD: Rhonda Nones, MD; Rhonda Simons, MD    CHIEF COMPLAINT:  Estrogen receptor positive Breast Cancer; Ovarian cancer; DVT  CURRENT TREATMENT: toremifene, palbociclib; denosumab/Prolia   INTERVAL HISTORY: [name] returns today for follow-up and treatment of her estrogen receptor positive breast cancer and her ovarian cancer.   She is accompanied today by her husband [name].  Since her last visit, she completed palliative radiation therapy to the progressive retroperitoneal lymphadenopathy from 05/23/2019 through 06/29/2019. She tolerated this generally well overall.  Specifically she had no significant problems with fatigue, fever, abdominal pain or cramps or diarrhea.  She denies hematuria or bright red blood per rectum.  Her palbociclib and toremifene had been on hold during radiation treatments. She has restarted both medications 4 weeks ago.  We are following her CA-125: Lab Results  Component Value Date   CAN125 116.0 (H) 08/05/2019   CAN125 84.1 (H) 05/18/2019   CAN125 99.0 (H) 04/20/2019   CAN125 91.7 (H) 12/30/2018   CAN125 225.0 (H) 08/10/2018    REVIEW OF SYSTEMS: [name] generally feels well.  Specifically she denies shortness of breath dizziness or chest pain or pressure despite her anemia.  She uses a wheelchair at home.  She mostly watches TV and  she sits on the porch and tells done what to do in the yard.  She also has [name], who is a combination poodle and [place] and is 64 years old.  That keeps her busy.  A detailed review of systems today was otherwise stable   BREAST CANCER HISTORY: From the original consult note:  [name] noted a mass in her right breast mid December 2013, and as it did not spontaneously resolve over a couple of weeks she brought it to her primary physician's attention. She was set up for diagnostic mammography and right breast ultrasonography at the breast Center 05/10/2012. (Note that the patient's most recent prior mammography had been in October 2008). The current study showed a spiculated mass in the superior subareolar portion of the right breast measuring approximately 5 cm and associated with pleomorphic calcifications. This was firm and palpable. There was right nipple retraction and skin thickening. Ultrasound confirmed an irregularly marginated hypoechoic mass measuring 3.5 cm by ultrasound, and an abnormal appearing lower right axillary lymph node measuring 2.6 cm.  Biopsies of both the breast mass and the abnormal appearing lymph node were performed 05/21/2012. Both showed an invasive ductal carcinoma, grade 2, with similar prognostic panels (the breast mass was 100% estrogen and 73% progesterone receptor positive, with an MIB-1 of 5%; the lymph node was 100% estrogen 100% progesterone receptor positive, with an MIB-1 of 20%). Both masses were HER-2 negative.  Breast MRI obtained at [facility] imaging 05/29/2012 confirmed a dominant mass in the retroareolar right breast measuring 4.4 cm maximally. There was a satellite nodule inferior and lateral to this mass, measuring 1.7 cm. There were no other masses in either breast. Aside from the previously biopsied lymph node there were  other mildly enhancing level I right axillary lymph nodes which did not appear pathologic. There was no other lymphadenopathy noted.     OVARIAN CANCER HISTORY: From the 06/16/2014 summary note:  "She was admitted to [facility] with a diagnosis of possible pneumonia in late December 2015. Chest x-ray showed some increased density in the right upper lobe. CT scan was suggested and was obtained by Dr. Inda [name]. This showed 2 small nodules in the right middle lobe, measuring 8 and 4 mm respectively. Dr. Inda [name] then contacted [name] for further evaluation and we set [name] up for a PET scan and which was performed late January. This showed the 2 nodules in the lung not to be hypermetabolic. This of course does not prove that they are not malignant. More importantly, there was a 13.7 cm cystic/solid mass in the upper pelvis associated with adenopathy,. All of this was hypermetabolic."  The patient was evaluated by gynecologic oncology and on 06/27/2014 she underwent surgery under Dr. Denman [name]. This consisted of an exploratory laparotomy with hysterectomy and abdominal salpingo-oophorectomy, as well as tumor debulking and omentectomy. 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 The tumor measured 11.5 cm, focally involve the ovarian capsule on the left; the uterus and right ovary and fallopian tube were unremarkable. One para-aortic lymph node was positive out of a total of 6 lymph nodes sampled (2 left common iliac, 2 left para-aortic, and to within the omental resection).  Her subsequent history is as detailed below   PAST MEDICAL HISTORY: Past Medical History:  Diagnosis Date   Allergy    Anemia    low iron hx   Arthritis    Breast cancer ([place]) 2014   a. Right - invasive ductal carcinoma with 2/18 lymph nodes involved (pT3, pN1a, stage IIIA), s/p R mastectomy 06/08/12, chemo (not well tolerated->d/c)   Cataract    removed ou   Chronic combined systolic and diastolic CHF (congestive heart failure) ([place])    a. 08/2012 Echo:  EF 55-60%, no rwma, mod MR;  b. 07/2014 Echo: EF 45-50%, distal antsept HK, mod TR/MR, mildly bil-atrial enlargement.   Clear Cell Ovarian Cancer    a. 06/2014 s/p TAH/BSO debulking/lymph node dissection;  b. Now on chemo.   Depression    Diabetes mellitus    DJD (degenerative joint disease) of lumbar spine    DVT (deep venous thrombosis) (HCC)    hx of on HRT left leg ~2006   Gallstones    GERD (gastroesophageal reflux disease)    Hyperlipidemia    Hypertension    Hypothyroidism    Liver disease, chronic, with cirrhosis ([place])    ? autoimmune   OSA on CPAP    cpap 4.5 setting   Paroxysmal atrial fibrillation (HCC)    a. CHA2DS2VASc = 4-->coumadin.   Physical deconditioning    PPD positive, treated    rx inh    Sleep apnea     PAST SURGICAL HISTORY: Past Surgical History:  Procedure Laterality Date   ABDOMINAL HYSTERECTOMY N/A 06/27/2014   Procedure: HYSTERECTOMY ABDOMINAL TOTAL;  Surgeon: Rhonda Amber, MD;  Location: [facility];  Service: Gynecology;  Laterality: N/A;   BREAST BIOPSY     left breast   BREAST EXCISIONAL BIOPSY Left 2002   CATARACT EXTRACTION     CHOLECYSTECTOMY     EYE SURGERY     FOOT SURGERY     history of chemotherapy x  2 treatments, radiation tx  2014   LAPAROTOMY N/A 06/27/2014   Procedure: EXPLORATORY LAPAROTOMY;  Surgeon: Rhonda Amber, MD;  Location: [facility];  Service: Gynecology;  Laterality: N/A;   MASTECTOMY Right 2014   MASTECTOMY MODIFIED RADICAL  06/08/2012   Procedure: MASTECTOMY MODIFIED RADICAL;  Surgeon: Edward Jolly, MD;  Location: [facility];  Service: General;  Laterality: Right;   pac removed  2015   PEG TUBE PLACEMENT     peg removed 2015   PERCUTANEOUS LIVER BIOPSY     PORTACATH PLACEMENT  06/08/2012   Procedure: INSERTION PORT-A-CATH;  Surgeon: Edward Jolly, MD;  Location: [facility];  Service: General;  Laterality: Left;   SALPINGOOPHORECTOMY Bilateral 06/27/2014   Procedure: BILATERAL SALPINGO  OOPHORECTOMY/TUMOR DEBULKING/LYMPHNODE DISSECTION, OMENTECTOMY;  Surgeon: Rhonda Amber, MD;  Location: [facility];  Service: Gynecology;  Laterality: Bilateral;   TUBAL LIGATION      FAMILY HISTORY Family History  Problem Relation Age of Onset   Diabetes Mother    Hypertension Mother    Arthritis Mother    Heart disease Mother    Heart failure Mother    Other Mother        benign breast mass   Stroke Father    Heart disease Father    Dementia Father    Diabetes Paternal Grandmother    Colon cancer Paternal Grandfather        dx. 75s-80s   Colon cancer Maternal Grandfather    Other Daughter        potentially has had negative BRCA testing   Other Daughter        hysterectomy; potentially has had negative genetic testing   Rectal cancer Neg Hx    Esophageal cancer Neg Hx    Liver cancer Neg Hx    The patient's father died in his 4s with a history of dementia. He had had prior strokes. The patient's mother died in her 27s, with a history of congestive heart failure. [name] had no brothers, one sister. There is no history of breast or ovarian cancer in the family.   GYNECOLOGIC HISTORY: Menarche age 26, first live birth age 26, she is GX P2, menopause approximately 15 years ago, on hormone replacement until 2010.   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 Her husband [name] works for [employer]. Daughter [name] is a Physiological scientist and lives in [place]. Daughter [name] and her family (husband and 2 children aged 60 and 3 years) currently live with the patient. [name] is a member of a [employer].     ADVANCED DIRECTIVES: Not in place   HEALTH MAINTENANCE: (Updated October 2014) Social History   Tobacco Use   Smoking status: Former Smoker    Packs/day: 1.00    Years: 1.00    Pack years: 1.00    Quit date: 12/09/1998     Years since quitting: 20.8   Smokeless tobacco: Former Systems developer  Substance Use Topics   Alcohol use: No   Drug use: No    Comment: quit age 85 only smoked as a teen 1 year     Colonoscopy:   PAP: Remote  Bone density:  08/14/2017 showed a T score of -2.7 osteoporosis  Lipid panel:   Allergies  Allergen Reactions   Olmesartan Medoxomil Cough   Adhesive [Tape] Rash   Tetracycline Hcl Other (See Comments)   Venlafaxine Other (See  Comments)    severe dry mouth    Current Outpatient Medications  Medication Sig Dispense Refill   acetaminophen (TYLENOL) 500 MG tablet Take 500 mg by mouth every 6 (six) hours as needed for moderate pain.     ALPRAZolam (XANAX) 0.5 MG tablet Take 1 tablet (0.5 mg total) by mouth 2 (two) times daily as needed. for anxiety 180 tablet 0   Blood Glucose Monitoring Suppl (ONETOUCH VERIO) w/Device KIT Use as directed twice a day.  Dx code: E11.9 1 kit 0   buPROPion (WELLBUTRIN XL) 300 MG 24 hr tablet Take 1 tablet (300 mg total) by mouth daily. 90 tablet 4   diazepam (VALIUM) 5 MG tablet Pt to take 1 tab 1 hour before MRI and may repeat if needed 2 tablet 0   escitalopram (LEXAPRO) 20 MG tablet TAKE 1 TABLET BY MOUTH IN  THE MORNING 90 tablet 3   furosemide (LASIX) 20 MG tablet TAKE 1 TABLET BY MOUTH  DAILY 90 tablet 3   glucose blood (ONETOUCH VERIO) test strip USE TO CHECK BLOOD SUGAR  TWO TIMES DAILY. Dx Code: E11.9 100 each 1   levothyroxine (SYNTHROID) 150 MCG tablet TAKE 1 TABLET BY MOUTH  DAILY 90 tablet 3   lidocaine-prilocaine (EMLA) cream APPLY TOPICALLY OVER PORT  AREA 1-2 HOURS BEFORE  CHEMOTHERAPY AS NEEDED 30 g 1   LORazepam (ATIVAN) 0.5 MG tablet Take 1 tablet (0.5 mg total) by mouth every 8 (eight) hours. Ativan 0.59m - Take 1 tablet prior to MRI imaging - may repeat if needed for anxiety. 30 tablet 0   metFORMIN (GLUCOPHAGE-XR) 500 MG 24 hr tablet TAKE 2 TABLETS BY MOUTH  EVERY EVENING 180 tablet 3   mirabegron ER (MYRBETRIQ) 25  MG TB24 tablet Myrbetriq 25 mg tablet,extended release  TAKE 1 TABLET BY MOUTH DAILY     OLANZapine (ZYPREXA) 2.5 MG tablet TAKE 1 TABLET BY MOUTH IN  THE EVENING AT BEDTIME 90 tablet 3   omeprazole (PRILOSEC) 20 MG capsule Take 1 capsule (20 mg total) by mouth daily. 90 capsule 3   ondansetron (ZOFRAN) 8 MG tablet TAKE 1 TABLET BY MOUTH  EVERY 8 HOURS AS NEEDED FOR NAUSEA OR VOMITING 90 tablet 4   OneTouch Delica Lancets 392ZMISC Use to check blood sugar twice a day.  DX Code: E11.9 200 each 1   oxybutynin (DITROPAN) 5 MG tablet TAKE 1 TABLET BY MOUTH 3  TIMES DAILY 270 tablet 3   palbociclib (IBRANCE) 100 MG tablet Take 1 tablet (100 mg total) by mouth daily. Take for 21 days on, 7 days off, repeat every 28 days. Start August 29, 2019 21 tablet 6   potassium chloride (K-DUR) 10 MEQ tablet TAKE 1 TABLET BY MOUTH  TWICE DAILY 180 tablet 3   pravastatin (PRAVACHOL) 10 MG tablet TAKE 1 TABLET BY MOUTH  DAILY 90 tablet 3   propranolol (INDERAL) 20 MG tablet TAKE 1 TABLET BY MOUTH  TWICE DAILY 180 tablet 0   Toremifene Citrate (FARESTON) 60 MG tablet Take 1 tablet (60 mg total) by mouth daily. Start 08/05/2019 90 tablet 4   No current facility-administered medications for this visit.    OBJECTIVE:  white woman examined in a wheelchair.  Vitals:   09/26/19 1020  BP: (!) 144/84  Pulse: 85  Resp: 18  Temp: 98.5 F (36.9 C)  SpO2: 98%     Body mass index is 26.19 kg/m.    ECOG FS: 1 Filed Weights   09/26/19 1020  Weight: 157 lb 6.4 oz (71.4 kg)     Sclerae unicteric, EOMs intact Wearing a mask No cervical or supraclavicular adenopathy Lungs no rales or rhonchi Heart regular rate and rhythm Abd soft, nontender, positive bowel sounds MSK no focal spinal tenderness Neuro: nonfocal, well oriented, appropriate affect Breasts: Deferred   LAB RESULTS: Lab Results  Component Value Date   WBC 1.4 (L) 09/26/2019   NEUTROABS 0.8 (L) 09/26/2019   HGB 7.3 (L) 09/26/2019   HCT  22.1 (L) 09/26/2019   MCV 109.4 (H) 09/26/2019   PLT 24 (L) 09/26/2019     Chemistry      Component Value Date/Time   NA 139 09/26/2019 1005   NA 140 04/28/2017 0809   K 3.3 (L) 09/26/2019 1005   K 3.4 (L) 04/28/2017 0809   CL 111 09/26/2019 1005   CL 104 10/29/2012 1058   CO2 21 (L) 09/26/2019 1005   CO2 22 04/28/2017 0809   BUN 8 09/26/2019 1005   BUN 6.5 (L) 04/28/2017 0809   CREATININE 0.65 09/26/2019 1005   CREATININE 0.6 04/28/2017 0809      Component Value Date/Time   CALCIUM 7.5 (L) 09/26/2019 1005   CALCIUM 8.4 04/28/2017 0809   ALKPHOS 97 09/26/2019 1005   ALKPHOS 143 04/28/2017 0809   AST 34 09/26/2019 1005   AST 53 (H) 04/28/2017 0809   ALT 18 09/26/2019 1005   ALT 21 04/28/2017 0809   BILITOT 1.3 (H) 09/26/2019 1005   BILITOT 2.96 (H) 04/28/2017 0809      STUDIES: No results found.   ASSESSMENT: 64 y.o.  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    (2)  treated in the adjuvant setting with docetaxel/ cyclophosphamide given every 3 weeks. There were multiple and severe complications, and the  patient tolerated only two cycles, last dose 07/29/2012  (3) letrozole started 08/16/2012  (a) osteopenia, with a T score of -1.14 April 2013  (b) bone density on 08/14/2017 showed a T score of -2.7 osteoporosis  (c) discontinued with rising tumor markers and 1 enlarged left para-aortic lymph node  (4) postmastectomy radiation 11/16/2012 - 12/24/2012  (a) Right chest wall/ 43.2 Gray @ 1.8 Gray per fraction x 24 fractions  (b) Right supraclavicular fossa / 45 Gy _0 .8 Gy per fraction x 25 fractions  (c) Right drain sites / 40 Gy @ 2 Gy per fraction x 20 fractions  (d) Right scar boost / 10 Gray at [name] per fraction x 5 fractions  (5)   comorbidities include diabetes, hypertension, chronic liver disease with cirrhosis and fatty liver, and nutritional disturbance with temporarily dependence on PEG feeds (discontinued July 2014).  (6) restaging studies January 2016 show  (a) 8 mm and 4 mm Right middle lobe lung nodules, not metabolically active on PET   (b) hypermetabolic 23.3 cm mixed solid/cystic lesion in upper pelvis, with associated adenopathy (see #7)  OVARIAN CANCER (7) status post exploratory laparotomy 06/27/2014 with total abdominal hysterectomy, bilateral salpingo-oophorectomy, para-aortic lymphadenectomy, omentectomy and radical tumor debulking for a left-sided clear cell ovarian cancer, pT1c pN1, stage IIIC .  (8) adjuvant chemotherapy consisting of carboplatin and paclitaxel given weekly days 1 and 8 of each 21 day cycle, for 6-8 cycles as tolerated, started 07/17/2014, with onpro support  (a) day  8 cycle 2 omitted and further treatments cancelled because of an episode of Klebsiella sepsis requiring intensive care unit admission-- last chemotherapy dose 03/26/2015  (9) bilateral lower extremity DVTs documented 09/25/2014 despite being on Coumadin for PAF (subtherapeutic INR) with mobile Right common femoral clot  (a) status post thrombolysis 09/30/2014  (b) on Lovenox daily as of 10/02/2014, stopped April 2017  (c) IVC filter placed 10/26/2014 as mobile clot still noted Right groin  (d) IVC filter removed 01/30/2015 with documented resolution of the movable thrombus 01/16/2015  (e) normal quantitative D-dimer 08/06/2015 and 09/24/2015  (10) started abraxane 10/13/2014, repeated every 14 days for 6 cycles (12 doses), completed 03/26/2015  (11) genetics testing December 04, 2014 through the Breast[facility]Ovarian Cancer Panel [facility] no deleterious mutations in ATM, BARD1, BRCA1, BRCA2, BRIP1, CDH1, CHEK2, FANCC, MLH1, MSH2, MSH6, NBN, PALB2, PMS2, PTEN, RAD51C, RAD51D, STK11, TP53, and XRCC2.    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  (a) spleen measured 14.3 cm on CT scan of the abdomen 10/17/2016 and 15.6 cm on PET scan 04/11/2019  ONGOING MONITORING AND PALLIATIVE TREATMENT: (14) pet SCAN 04/02/2018 shows retroperitoneal adenopathy, hypermetabolic and small left axillary and mediastinal lymph nodes  (a) Ca 125 is informative  (b) MRI of orbits 07/03/2018 no masses or findings of concern  (c) CT scans C/A/P on 08/10/2018 shows an enlarging left periaortic node, other nodes stable; also significant cirrhosis/ splenomegaly  (d) abdominal ultrasound on 01/26/2019 shows chronic cirrhosis, no ascites  (e) PET scan 04/11/2019 shows a 3.6 cm area of para-aortic adenopathy with an SUV max of 23.9 as well as to other adjacent areas also "hot".  There is no significant uptake in the liver.  (15) toremifene started 08/18/2018  (a) palbociclib added 04/13/22020 at 100 mg/ day 21/7  (b) held during radiation January 2021, resumed 08/29/2019, palbociclib held again May 2021 secondary to cytopenias, toremifene continue  (16) palliative radiation to areas of para-aortic recurrence 05/23/2019 through 06/29/2019 Site Technique Total Dose (Gy) Dose per Fx (Gy) Completed Fx Beam Energies  Abdomen: Abd IMRT 56/56 2 28/28 6X    PLAN: Jaydon is now 7-1/2 years out from initial diagnosis of her breast cancer and that disease is well controlled.  She continues on toremifene.  We are holding the palbociclib until her counts more fully recovered.  She was first diagnosed with ovarian cancer a little over 5 years ago.  This has been recurrent and just treated with palliative radiation.  She tolerated that well.  However her counts have not recovered.  Likely this is a combination of the radiation and also the palbociclib that was resumed a month  ago.  Accordingly we are holding the palbociclib for now.  She will be returning to see Dr. Randa Ngo 10/27/2019 and we will repeat counts at that time if the counts have recovered we can resume palbociclib then.  If not she will return to see me in July and if the counts have not recovered by that time we will have to come up with a different treatment for her than the palbociclib.  In the meantime she is continuing the toremifene.  She will need restaging and I have set her up for a PET scan in approximately 1 month.  We are also following her CA-125.  She knows to call for any other issue that may develop before  the next visit.  Total encounter time 35 minutes.Sarajane Jews C. Passion Lavin, MD  09/26/19 10:51 AM Medical Oncology and Hematology [facility] [place], [place] [address] Tel. [phone]    Fax. [phone]   I, Wilburn Mylar, am acting as scribe for Dr. Virgie [name]. Johnathan Tortorelli.  I, Lurline Del MD, have reviewed the above documentation for accuracy and completeness, and I agree with the above.   *Total Encounter Time as defined by the Centers for Medicare and Medicaid Services includes, in addition to the face-to-face time of a patient visit (documented in the note above) non-face-to-face time: obtaining and reviewing outside history, ordering and reviewing medications, tests or procedures, care coordination (communications with other health care professionals or caregivers) and documentation in the medical record.

## 2019-09-26 ENCOUNTER — Inpatient Hospital Stay: Payer: Medicare Other

## 2019-09-26 ENCOUNTER — Other Ambulatory Visit: Payer: Self-pay

## 2019-09-26 ENCOUNTER — Inpatient Hospital Stay: Payer: Medicare Other | Attending: Adult Health | Admitting: Oncology

## 2019-09-26 VITALS — BP 144/84 | HR 85 | Temp 98.5°F | Resp 18 | Ht 65.0 in | Wt 157.4 lb

## 2019-09-26 DIAGNOSIS — Z90722 Acquired absence of ovaries, bilateral: Secondary | ICD-10-CM | POA: Diagnosis not present

## 2019-09-26 DIAGNOSIS — D696 Thrombocytopenia, unspecified: Secondary | ICD-10-CM

## 2019-09-26 DIAGNOSIS — E119 Type 2 diabetes mellitus without complications: Secondary | ICD-10-CM | POA: Insufficient documentation

## 2019-09-26 DIAGNOSIS — C562 Malignant neoplasm of left ovary: Secondary | ICD-10-CM | POA: Diagnosis present

## 2019-09-26 DIAGNOSIS — Z86718 Personal history of other venous thrombosis and embolism: Secondary | ICD-10-CM | POA: Insufficient documentation

## 2019-09-26 DIAGNOSIS — Z7901 Long term (current) use of anticoagulants: Secondary | ICD-10-CM | POA: Diagnosis not present

## 2019-09-26 DIAGNOSIS — C561 Malignant neoplasm of right ovary: Secondary | ICD-10-CM

## 2019-09-26 DIAGNOSIS — F329 Major depressive disorder, single episode, unspecified: Secondary | ICD-10-CM | POA: Insufficient documentation

## 2019-09-26 DIAGNOSIS — Z800 Family history of malignant neoplasm of digestive organs: Secondary | ICD-10-CM | POA: Insufficient documentation

## 2019-09-26 DIAGNOSIS — C50811 Malignant neoplasm of overlapping sites of right female breast: Secondary | ICD-10-CM

## 2019-09-26 DIAGNOSIS — G63 Polyneuropathy in diseases classified elsewhere: Secondary | ICD-10-CM

## 2019-09-26 DIAGNOSIS — C772 Secondary and unspecified malignant neoplasm of intra-abdominal lymph nodes: Secondary | ICD-10-CM | POA: Insufficient documentation

## 2019-09-26 DIAGNOSIS — G4733 Obstructive sleep apnea (adult) (pediatric): Secondary | ICD-10-CM | POA: Diagnosis not present

## 2019-09-26 DIAGNOSIS — Z90710 Acquired absence of both cervix and uterus: Secondary | ICD-10-CM | POA: Insufficient documentation

## 2019-09-26 DIAGNOSIS — Z8261 Family history of arthritis: Secondary | ICD-10-CM | POA: Insufficient documentation

## 2019-09-26 DIAGNOSIS — I480 Paroxysmal atrial fibrillation: Secondary | ICD-10-CM | POA: Diagnosis not present

## 2019-09-26 DIAGNOSIS — Z452 Encounter for adjustment and management of vascular access device: Secondary | ICD-10-CM | POA: Diagnosis not present

## 2019-09-26 DIAGNOSIS — C569 Malignant neoplasm of unspecified ovary: Secondary | ICD-10-CM

## 2019-09-26 DIAGNOSIS — Z79899 Other long term (current) drug therapy: Secondary | ICD-10-CM | POA: Insufficient documentation

## 2019-09-26 DIAGNOSIS — Z95828 Presence of other vascular implants and grafts: Secondary | ICD-10-CM

## 2019-09-26 DIAGNOSIS — G629 Polyneuropathy, unspecified: Secondary | ICD-10-CM | POA: Diagnosis not present

## 2019-09-26 DIAGNOSIS — Z9011 Acquired absence of right breast and nipple: Secondary | ICD-10-CM | POA: Insufficient documentation

## 2019-09-26 DIAGNOSIS — Z170 Estrogen receptor positive status [ER+]: Secondary | ICD-10-CM | POA: Insufficient documentation

## 2019-09-26 DIAGNOSIS — E039 Hypothyroidism, unspecified: Secondary | ICD-10-CM | POA: Insufficient documentation

## 2019-09-26 DIAGNOSIS — Z9079 Acquired absence of other genital organ(s): Secondary | ICD-10-CM | POA: Diagnosis not present

## 2019-09-26 DIAGNOSIS — Z7984 Long term (current) use of oral hypoglycemic drugs: Secondary | ICD-10-CM | POA: Diagnosis not present

## 2019-09-26 DIAGNOSIS — C801 Malignant (primary) neoplasm, unspecified: Secondary | ICD-10-CM

## 2019-09-26 DIAGNOSIS — Z833 Family history of diabetes mellitus: Secondary | ICD-10-CM | POA: Insufficient documentation

## 2019-09-26 DIAGNOSIS — M818 Other osteoporosis without current pathological fracture: Secondary | ICD-10-CM

## 2019-09-26 DIAGNOSIS — Z853 Personal history of malignant neoplasm of breast: Secondary | ICD-10-CM | POA: Diagnosis not present

## 2019-09-26 DIAGNOSIS — Z87891 Personal history of nicotine dependence: Secondary | ICD-10-CM | POA: Diagnosis not present

## 2019-09-26 DIAGNOSIS — I110 Hypertensive heart disease with heart failure: Secondary | ICD-10-CM | POA: Diagnosis not present

## 2019-09-26 DIAGNOSIS — C50919 Malignant neoplasm of unspecified site of unspecified female breast: Secondary | ICD-10-CM | POA: Diagnosis not present

## 2019-09-26 DIAGNOSIS — M8580 Other specified disorders of bone density and structure, unspecified site: Secondary | ICD-10-CM | POA: Diagnosis not present

## 2019-09-26 DIAGNOSIS — E785 Hyperlipidemia, unspecified: Secondary | ICD-10-CM | POA: Insufficient documentation

## 2019-09-26 DIAGNOSIS — I5042 Chronic combined systolic (congestive) and diastolic (congestive) heart failure: Secondary | ICD-10-CM | POA: Diagnosis not present

## 2019-09-26 DIAGNOSIS — D612 Aplastic anemia due to other external agents: Secondary | ICD-10-CM

## 2019-09-26 DIAGNOSIS — Z8249 Family history of ischemic heart disease and other diseases of the circulatory system: Secondary | ICD-10-CM | POA: Insufficient documentation

## 2019-09-26 DIAGNOSIS — K7460 Unspecified cirrhosis of liver: Secondary | ICD-10-CM | POA: Insufficient documentation

## 2019-09-26 LAB — CBC WITH DIFFERENTIAL/PLATELET
Abs Immature Granulocytes: 0.01 10*3/uL (ref 0.00–0.07)
Basophils Absolute: 0 10*3/uL (ref 0.0–0.1)
Basophils Relative: 1 %
Eosinophils Absolute: 0.1 10*3/uL (ref 0.0–0.5)
Eosinophils Relative: 5 %
HCT: 22.1 % — ABNORMAL LOW (ref 36.0–46.0)
Hemoglobin: 7.3 g/dL — ABNORMAL LOW (ref 12.0–15.0)
Immature Granulocytes: 1 %
Lymphocytes Relative: 20 %
Lymphs Abs: 0.3 10*3/uL — ABNORMAL LOW (ref 0.7–4.0)
MCH: 36.1 pg — ABNORMAL HIGH (ref 26.0–34.0)
MCHC: 33 g/dL (ref 30.0–36.0)
MCV: 109.4 fL — ABNORMAL HIGH (ref 80.0–100.0)
Monocytes Absolute: 0.2 10*3/uL (ref 0.1–1.0)
Monocytes Relative: 15 %
Neutro Abs: 0.8 10*3/uL — ABNORMAL LOW (ref 1.7–7.7)
Neutrophils Relative %: 58 %
Platelets: 24 10*3/uL — ABNORMAL LOW (ref 150–400)
RBC: 2.02 MIL/uL — ABNORMAL LOW (ref 3.87–5.11)
RDW: 21.5 % — ABNORMAL HIGH (ref 11.5–15.5)
WBC: 1.4 10*3/uL — ABNORMAL LOW (ref 4.0–10.5)
nRBC: 1.4 % — ABNORMAL HIGH (ref 0.0–0.2)

## 2019-09-26 LAB — COMPREHENSIVE METABOLIC PANEL
ALT: 18 U/L (ref 0–44)
AST: 34 U/L (ref 15–41)
Albumin: 2.6 g/dL — ABNORMAL LOW (ref 3.5–5.0)
Alkaline Phosphatase: 97 U/L (ref 38–126)
Anion gap: 7 (ref 5–15)
BUN: 8 mg/dL (ref 8–23)
CO2: 21 mmol/L — ABNORMAL LOW (ref 22–32)
Calcium: 7.5 mg/dL — ABNORMAL LOW (ref 8.9–10.3)
Chloride: 111 mmol/L (ref 98–111)
Creatinine, Ser: 0.65 mg/dL (ref 0.44–1.00)
GFR calc Af Amer: >60 mL/min (ref >60)
GFR calc non Af Amer: >60 mL/min (ref >60)
Glucose, Bld: 205 mg/dL — ABNORMAL HIGH (ref 70–99)
Potassium: 3.3 mmol/L — ABNORMAL LOW (ref 3.5–5.1)
Sodium: 139 mmol/L (ref 135–145)
Total Bilirubin: 1.3 mg/dL — ABNORMAL HIGH (ref 0.3–1.2)
Total Protein: 5.9 g/dL — ABNORMAL LOW (ref 6.5–8.1)

## 2019-09-26 MED ORDER — SODIUM CHLORIDE 0.9% FLUSH
10.0000 mL | Freq: Once | INTRAVENOUS | Status: AC
  Administered 2019-09-26: 10 mL
  Filled 2019-09-26: qty 10

## 2019-09-26 MED ORDER — HEPARIN SOD (PORK) LOCK FLUSH 100 UNIT/ML IV SOLN
500.0000 [IU] | Freq: Once | INTRAVENOUS | Status: AC
  Administered 2019-09-26: 500 [IU]
  Filled 2019-09-26: qty 5

## 2019-09-27 ENCOUNTER — Telehealth: Payer: Self-pay | Admitting: Oncology

## 2019-09-27 ENCOUNTER — Other Ambulatory Visit: Payer: Self-pay | Admitting: Oncology

## 2019-09-27 LAB — CA 125: Cancer Antigen (CA) 125: 85.4 U/mL — ABNORMAL HIGH (ref 0.0–38.1)

## 2019-09-27 NOTE — Telephone Encounter (Signed)
Scheduled appts per 5/17 los. Pt confirmed appt date and time.

## 2019-10-06 MED FILL — TOREMIFENE CITRATE 60 MG TA: 60 | 30 days supply | Qty: 30 | Fill #2

## 2019-10-13 ENCOUNTER — Other Ambulatory Visit: Payer: Self-pay | Admitting: *Deleted

## 2019-10-13 ENCOUNTER — Ambulatory Visit (INDEPENDENT_AMBULATORY_CARE_PROVIDER_SITE_OTHER): Payer: Medicare Other | Admitting: Gastroenterology

## 2019-10-13 ENCOUNTER — Encounter: Payer: Self-pay | Admitting: Gastroenterology

## 2019-10-13 VITALS — BP 102/56 | HR 63

## 2019-10-13 DIAGNOSIS — Z86718 Personal history of other venous thrombosis and embolism: Secondary | ICD-10-CM

## 2019-10-13 DIAGNOSIS — C569 Malignant neoplasm of unspecified ovary: Secondary | ICD-10-CM | POA: Diagnosis not present

## 2019-10-13 DIAGNOSIS — R161 Splenomegaly, not elsewhere classified: Secondary | ICD-10-CM

## 2019-10-13 DIAGNOSIS — R7989 Other specified abnormal findings of blood chemistry: Secondary | ICD-10-CM

## 2019-10-13 DIAGNOSIS — K760 Fatty (change of) liver, not elsewhere classified: Secondary | ICD-10-CM

## 2019-10-13 DIAGNOSIS — K744 Secondary biliary cirrhosis: Secondary | ICD-10-CM

## 2019-10-13 DIAGNOSIS — K7460 Unspecified cirrhosis of liver: Secondary | ICD-10-CM

## 2019-10-13 DIAGNOSIS — I8500 Esophageal varices without bleeding: Secondary | ICD-10-CM | POA: Diagnosis not present

## 2019-10-13 DIAGNOSIS — Z7901 Long term (current) use of anticoagulants: Secondary | ICD-10-CM

## 2019-10-13 NOTE — Progress Notes (Signed)
HPI :  64 year old female here for a follow-up visit.She has a history of cirrhosis,breast cancer / ovarian cancer.   History of fatty liver and she underwent a liver biopsy in 2010 which showed cirrhosis - description shows steatosis and I suspect NASH, although ANA was markedly positive back then but histology notconsistent withautoimmune hepatitis.Smooth muscle antibody is negative as well as her immunoglobulin level.She was vaccinated for hepatitisAand B previously.She has been treated with propranolol (switched from metoprolol) so she does not undergo screening for varices - these were noted on prior imaging. She has declined EGD in the past, as well as colonoscopy for screening purposes, she does not wish to have any colonoscopy exams.   Unfortunately since have last seen her, her ovarian cancer has progressed with retroperitoneal lymphadenopathy.  She has been treated with radiation and chemotherapy regimen per Dr. Jana Hakim.  Her course has been complicated by pancytopenia and one of her medications was held due to recent labs 2 weeks ago.  White blood cell count last 1.4, hemoglobin 7.3, platelet count 24.  She has follow-up blood work planned per hematology soon.  She has not had any decompensating events since have last seen her.  No lower extremity edema, no ascites.  She takes Lasix as needed.  Despite her progressive malignancy she is actually feeling pretty well and denies complaints.  She ambulates using a wheelchair due to chronic knee pain.  She has not had any issues with encephalopathy.  She denies any blood in her stools.  She had a negative Cologuard in February 2020 and adamantly denies any future colonoscopy exams.  Her last ultrasound was in March which showed no concerning changes of her cirrhosis, no mass lesions.  She has a follow-up PET scan planned for June 15.  [name] RUQ 07/29/19 - IMPRESSION: 1. Liver appearance is consistent with the given diagnosis of cirrhosis.  No liver mass. 2. No acute findings.  No bile duct dilation.  PET scan 04/11/19: IMPRESSION: 1. Progression of retroperitoneal lymphadenopathy, as detailed above, indicative of progressive metastatic disease. 2. Stable size and low-level hypermetabolism of nonenlarged prevascular lymph node (nonspecific). 3. No new sites of metastatic disease noted elsewhere in the neck, chest, abdomen or pelvis. 4. Cirrhosis. 5. Splenomegaly suggestive of portal venous hypertension. 6. Small volume of ascites. 7. Moderate right pleural effusion lying dependently.   Past Medical History:  Diagnosis Date  . Allergy   . Anemia    low iron hx  . Arthritis   . Breast cancer ([place]) 2014   a. 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 Cataract    removed ou  . Chronic combined systolic and diastolic CHF (congestive heart failure) ([place])    a. 08/2012 Echo: EF 55-60%, no rwma, mod MR;  b. 07/2014 Echo: EF 45-50%, distal antsept HK, mod TR/MR, mildly bil-atrial enlargement.  . Clear Cell Ovarian Cancer    a. 06/2014 s/p TAH/BSO debulking/lymph node dissection;  b. Now on chemo.  . Depression   . Diabetes mellitus   . DJD (degenerative joint disease) of lumbar spine   . DVT (deep venous thrombosis) (HCC)    hx of on HRT left leg ~2006  . Gallstones   . GERD (gastroesophageal reflux disease)   . Hyperlipidemia   . Hypertension   . Hypothyroidism   . Liver disease, chronic, with cirrhosis ([place])    ? autoimmune  . OSA  on CPAP    cpap 4.5 setting  . Paroxysmal atrial fibrillation (HCC)    a. CHA2DS2VASc = 4-->coumadin.  [name] Physical deconditioning   . PPD positive, treated    rx inh   . Sleep apnea      Past Surgical History:  Procedure Laterality Date  . ABDOMINAL HYSTERECTOMY N/A 06/27/2014   Procedure: HYSTERECTOMY ABDOMINAL TOTAL;  Surgeon: Everitt Amber, MD;  Location: [facility];  Service:  Gynecology;  Laterality: N/A;  . BREAST BIOPSY     left breast  . BREAST EXCISIONAL BIOPSY Left 2002  . CATARACT EXTRACTION    . CHOLECYSTECTOMY    . EYE SURGERY    . FOOT SURGERY    . history of chemotherapy x 2 treatments, radiation tx  2014  . LAPAROTOMY N/A 06/27/2014   Procedure: EXPLORATORY LAPAROTOMY;  Surgeon: Everitt Amber, MD;  Location: [facility];  Service: Gynecology;  Laterality: N/A;  . MASTECTOMY Right 2014  . MASTECTOMY MODIFIED RADICAL  06/08/2012   Procedure: MASTECTOMY MODIFIED RADICAL;  Surgeon: Edward Jolly, MD;  Location: [facility];  Service: General;  Laterality: Right;  . pac removed  2015  . PEG TUBE PLACEMENT     peg removed 2015  . PERCUTANEOUS LIVER BIOPSY    . PORTACATH PLACEMENT  06/08/2012   Procedure: INSERTION PORT-A-CATH;  Surgeon: Edward Jolly, MD;  Location: [facility];  Service: General;  Laterality: Left;  . SALPINGOOPHORECTOMY Bilateral 06/27/2014   Procedure: BILATERAL SALPINGO OOPHORECTOMY/TUMOR DEBULKING/LYMPHNODE DISSECTION, OMENTECTOMY;  Surgeon: Everitt Amber, MD;  Location: [facility];  Service: Gynecology;  Laterality: Bilateral;  . TUBAL LIGATION     Family History  Problem Relation Age of Onset  . Diabetes Mother   . Hypertension Mother   . Arthritis Mother   . Heart disease Mother   . Heart failure Mother   . Other Mother        benign breast mass  . Stroke Father   . Heart disease Father   . Dementia Father   . Diabetes Paternal Grandmother   . Colon cancer Paternal Grandfather        dx. 70s-80s  . Colon cancer Maternal Grandfather   . Other Daughter        potentially has had negative BRCA testing  . Other Daughter        hysterectomy; potentially has had negative genetic testing  . Rectal cancer Neg Hx   . Esophageal cancer Neg Hx   . Liver cancer Neg Hx    Social History   Tobacco Use  . Smoking status: Former Smoker    Packs/day: 1.00    Years: 1.00    Pack years: 1.00  . Smokeless tobacco: Never Used  Substance Use  Topics  . Alcohol use: No  . Drug use: No    Comment: quit age 11 only smoked as a teen 1 year   Current Outpatient Medications  Medication Sig Dispense Refill  . acetaminophen (TYLENOL) 500 MG tablet Take 500 mg by mouth every 6 (six) hours as needed for moderate pain.    [name] ALPRAZolam (XANAX) 0.5 MG tablet TAKE 1 TABLET BY MOUTH  TWICE DAILY AS NEEDED FOR  ANXIETY 180 tablet 0  . Blood Glucose Monitoring Suppl (ONETOUCH VERIO) w/Device KIT Use as directed twice a day.  Dx code: E11.9 1 kit 0  . buPROPion (WELLBUTRIN XL) 300 MG 24 hr tablet Take 1 tablet (300 mg total) by mouth daily. 90 tablet 4  . diazepam (VALIUM)  5 MG tablet Pt to take 1 tab 1 hour before MRI and may repeat if needed 2 tablet 0  . escitalopram (LEXAPRO) 20 MG tablet TAKE 1 TABLET BY MOUTH IN  THE MORNING 90 tablet 3  . furosemide (LASIX) 20 MG tablet TAKE 1 TABLET BY MOUTH  DAILY 90 tablet 3  . glucose blood (ONETOUCH VERIO) test strip USE TO CHECK BLOOD SUGAR  TWO TIMES DAILY. Dx Code: E11.9 100 each 1  . levothyroxine (SYNTHROID) 150 MCG tablet TAKE 1 TABLET BY MOUTH  DAILY 90 tablet 3  . lidocaine-prilocaine (EMLA) cream APPLY TOPICALLY OVER PORT  AREA 1-2 HOURS BEFORE  CHEMOTHERAPY AS NEEDED 30 g 1  . LORazepam (ATIVAN) 0.5 MG tablet Take 1 tablet (0.5 mg total) by mouth every 8 (eight) hours. Ativan 0.84m - Take 1 tablet prior to MRI imaging - may repeat if needed for anxiety. 30 tablet 0  . metFORMIN (GLUCOPHAGE-XR) 500 MG 24 hr tablet TAKE 2 TABLETS BY MOUTH  EVERY EVENING 180 tablet 3  . mirabegron ER (MYRBETRIQ) 25 MG TB24 tablet Myrbetriq 25 mg tablet,extended release  TAKE 1 TABLET BY MOUTH DAILY    . OLANZapine (ZYPREXA) 2.5 MG tablet TAKE 1 TABLET BY MOUTH IN  THE EVENING AT BEDTIME 90 tablet 3  . omeprazole (PRILOSEC) 20 MG capsule Take 1 capsule (20 mg total) by mouth daily. 90 capsule 3  . ondansetron (ZOFRAN) 8 MG tablet TAKE 1 TABLET BY MOUTH  EVERY 8 HOURS AS NEEDED FOR NAUSEA OR VOMITING 90 tablet 4  .  OneTouch Delica Lancets 312XMISC Use to check blood sugar twice a day.  DX Code: E11.9 200 each 1  . oxybutynin (DITROPAN) 5 MG tablet TAKE 1 TABLET BY MOUTH 3  TIMES DAILY 270 tablet 3  . potassium chloride (K-DUR) 10 MEQ tablet TAKE 1 TABLET BY MOUTH  TWICE DAILY 180 tablet 3  . pravastatin (PRAVACHOL) 10 MG tablet TAKE 1 TABLET BY MOUTH  DAILY 90 tablet 3  . propranolol (INDERAL) 20 MG tablet TAKE 1 TABLET BY MOUTH  TWICE DAILY 180 tablet 0  . Toremifene Citrate (FARESTON) 60 MG tablet Take 1 tablet (60 mg total) by mouth daily. Start 08/05/2019 90 tablet 4   No current facility-administered medications for this visit.   Allergies  Allergen Reactions  . Olmesartan Medoxomil Cough  . Adhesive [Tape] Rash  . Tetracycline Hcl Other (See Comments)  . Venlafaxine Other (See Comments)    severe dry mouth     Review of Systems: All systems reviewed and negative except where noted in HPI.   Lab Results  Component Value Date   WBC 1.4 (L) 09/26/2019   HGB 7.3 (L) 09/26/2019   HCT 22.1 (L) 09/26/2019   MCV 109.4 (H) 09/26/2019   PLT 24 (L) 09/26/2019    Lab Results  Component Value Date   CREATININE 0.65 09/26/2019   BUN 8 09/26/2019   NA 139 09/26/2019   K 3.3 (L) 09/26/2019   CL 111 09/26/2019   CO2 21 (L) 09/26/2019    Lab Results  Component Value Date   ALT 18 09/26/2019   AST 34 09/26/2019   ALKPHOS 97 09/26/2019   BILITOT 1.3 (H) 09/26/2019   Lab Results  Component Value Date   INR 1.6 (H) 03/09/2019   INR 1.5 (H) 12/21/2017   INR 1.5 (H) 07/01/2017   PROTIME 14.4 (H) 11/03/2014   PROTIME 14.4 (H) 10/13/2014   PROTIME 14.4 (H) 10/06/2014  Physical Exam: BP (!) 102/56   Pulse 63   SpO2 98%  Constitutional: Pleasant,well-developed, female in no acute distress. Abdominal: Soft, nondistended, nontender.  There are no masses palpable.  Extremities: no edema Lymphadenopathy: No cervical adenopathy noted. Neurological: Alert and oriented to person place  and time. Skin: Skin is warm and dry. No rashes noted. Psychiatric: Normal mood and affect. Behavior is normal.   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  Fortunately compensated since I have last seen her.  She has declined EGD over the years, on propranolol due to varices noted on cross-sectional imaging.  Her main medical problem is her progressive ovarian cancer at this time managed per oncology, awaiting follow-up PET scan in September.  We discussed her cirrhosis, she is at risk for decompensation moving forward and will let me know if she develops any concerning symptoms however fortunately she is doing okay in this regard.  She is not a liver transplant candidate should she progress given her active malignancy.  We will send AFP and INR to get done with her next routine blood draw.  Will await her PET scan results and continue [place] screening every 6 months.  She will see me again in 6 months.  Of note, I did discuss her pravastatin use, and I think okay to continue as long as her cirrhosis remains compensated and liver function okay.  Ovarian cancer - awaiting PET scan per Oncology, pancytopenia due to chemo, awaiting follow up labs. No symptoms of bleeding fortunately  Everton Cellar, MD [facility]

## 2019-10-13 NOTE — Patient Instructions (Addendum)
If you are age 66 or older, your body mass index should be between 23-30. Your There is no height or weight on file to calculate BMI. If this is out of the aforementioned range listed, please consider follow up with your Primary Care Provider.  If you are age 12 or younger, your body mass index should be between 19-25. Your There is no height or weight on file to calculate BMI. If this is out of the aformentioned range listed, please consider follow up with your Primary Care Provider.    Continue propranolol.  You have been scheduled for an abdominal ultrasound at [facility] (1st floor of hospital) on [address] at 8:00am. Please arrive 15 minutes prior to your appointment for registration. Make certain not to have anything to eat or drink 6 hours prior to your appointment. Should you need to reschedule your appointment, please contact radiology at [phone]. This test typically takes about 30 minutes to perform.  We will ask Dr. Virgie Dad office to add a PT/INR and AFP lab to your upcoming lab work.   Thank you for entrusting me with your care and for choosing [facility], Dr. De Leon Cellar

## 2019-10-25 ENCOUNTER — Encounter (HOSPITAL_COMMUNITY): Payer: No Typology Code available for payment source

## 2019-10-26 DIAGNOSIS — H903 Sensorineural hearing loss, bilateral: Secondary | ICD-10-CM | POA: Diagnosis not present

## 2019-10-26 DIAGNOSIS — H9193 Unspecified hearing loss, bilateral: Secondary | ICD-10-CM | POA: Diagnosis not present

## 2019-10-26 NOTE — Progress Notes (Signed)
Radiation Oncology         [phone] ________________________________  Name: [name] MRN: [id number]  Date: 10/27/2019  DOB: 1955/12/14  Follow-Up Visit Note  CC: Rhonda Held, DO  [name], *    ICD-10-CM   1. 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 Pablo Ledger) Right chest wall/ 43.2 Gray @ 1.8 [place] Cables per fraction x 24 fractions Right supraclavicular fossa / 45 Gy '@1' .8 Gy per fraction x 25 fractions Right drain sites / 40 Gy @ 2 Gy per fraction x 20 fractions Right scar boost / 10 Gray at [employer] per fraction x 5 fractions  Narrative:  The patient returns today for routine follow-up. Since her last visit, she underwent an abdominal ultrasound on 07/29/2019. Results showed a liver appearance that was consistent with the given diagnosis of cirrhosis. There was no liver mass, no acute findings, and no bile duct dilation.  She was seen by Dr. Jana [name] on 08/05/2019, during which time she was restarted on Toremifene. Palbociclib was still on hold secondary to her low counts. She was seen by Dr. Jana [name] again on 09/26/2019. At that time, it was noted that they will continue to hold Palbociclib until her counts have recovered and a restaging PET scan is scheduled for tomorrow.   On review of systems, she reports no complaints. She denies any pain.  Denies any back pain abdominal bloating or problems with constipation or diarrhea  ALLERGIES:  is allergic to olmesartan medoxomil, adhesive [tape], tetracycline hcl, and venlafaxine.  Meds: Current  Outpatient Medications  Medication Sig Dispense Refill  . acetaminophen (TYLENOL) 500 MG tablet Take 500 mg by mouth every 6 (six) hours as needed for moderate pain.    [name] ALPRAZolam (XANAX) 0.5 MG tablet TAKE 1 TABLET BY MOUTH  TWICE DAILY AS NEEDED FOR  ANXIETY 180 tablet 0  . Blood Glucose Monitoring Suppl (ONETOUCH VERIO) w/Device KIT Use as directed twice a day.  Dx code: E11.9 1 kit 0  . buPROPion (WELLBUTRIN XL) 300 MG 24 hr tablet Take 1 tablet (300 mg total) by mouth daily. 90 tablet 4  . escitalopram (LEXAPRO) 20 MG tablet TAKE 1 TABLET BY MOUTH IN  THE MORNING 90 tablet 3  . furosemide (LASIX) 20 MG tablet TAKE 1 TABLET BY MOUTH  DAILY 90 tablet 3  . glucose blood (ONETOUCH VERIO) test strip USE TO CHECK BLOOD SUGAR  TWO TIMES DAILY. Dx Code: E11.9 100 each 1  . levothyroxine (SYNTHROID) 150 MCG tablet TAKE 1 TABLET BY MOUTH  DAILY 90 tablet 3  . lidocaine-prilocaine (EMLA) cream APPLY TOPICALLY OVER PORT  AREA 1-2 HOURS BEFORE  CHEMOTHERAPY AS NEEDED 30 g 1  . metFORMIN (GLUCOPHAGE-XR) 500 MG 24 hr tablet TAKE 2 TABLETS BY MOUTH  EVERY EVENING 180 tablet 3  . OLANZapine (ZYPREXA) 2.5 MG tablet TAKE 1 TABLET BY MOUTH IN  THE EVENING AT BEDTIME 90 tablet 3  . omeprazole (PRILOSEC) 20 MG capsule Take 1 capsule (20 mg total) by mouth daily. 90 capsule 3  . ondansetron (ZOFRAN) 8  MG tablet TAKE 1 TABLET BY MOUTH  EVERY 8 HOURS AS NEEDED FOR NAUSEA OR VOMITING 90 tablet 4  . OneTouch Delica Lancets 76E MISC Use to check blood sugar twice a day.  DX Code: E11.9 200 each 1  . oxybutynin (DITROPAN) 5 MG tablet TAKE 1 TABLET BY MOUTH 3  TIMES DAILY 270 tablet 3  . potassium chloride (K-DUR) 10 MEQ tablet TAKE 1 TABLET BY MOUTH  TWICE DAILY 180 tablet 3  . pravastatin (PRAVACHOL) 10 MG tablet TAKE 1 TABLET BY MOUTH  DAILY 90 tablet 3  . propranolol (INDERAL) 20 MG tablet TAKE 1 TABLET BY MOUTH  TWICE DAILY 180 tablet 0  . Toremifene Citrate (FARESTON) 60 MG tablet Take 1 tablet (60 mg total) by  mouth daily. Start 08/05/2019 90 tablet 4   No current facility-administered medications for this encounter.    Physical Findings: The patient is in no acute distress. Patient is alert and oriented.  height is '5\' 5"'  (1.651 m). Her temporal temperature is 97.3 F (36.3 C) (abnormal). Her blood pressure is 123/57 (abnormal) and her pulse is 66. Her respiration is 18 and oxygen saturation is 98%.   Lungs are clear to auscultation bilaterally. Heart has regular rate and rhythm. No palpable cervical, supraclavicular, or axillary adenopathy. Abdomen soft, non-tender, normal bowel sounds.   Lab Findings: Lab Results  Component Value Date   WBC 4.0 10/27/2019   HGB 8.7 (L) 10/27/2019   HCT 28.7 (L) 10/27/2019   MCV 116.2 (H) 10/27/2019   PLT 84 (L) 10/27/2019    Radiographic Findings: No results found.  Impression: Stage IIIC (pT1c, pN1) Clear Cell Left Ovarian Cancer  Stage IIIA (pT3, pN1a) Right Breast Invasive Ductal Carcinoma of overlapping sites, ER+ /PR+ /Her2-, grade 2  No evidence of recurrence on clinical exam today.   Plan: The patient is scheduled to have a PET scan today. Following this, she will see Dr. Jana [name] on 11/18/2019. Follow-up with radiation oncology in as needed basis. Patient's PET scan report should be out later today and hope to give this information to her today or tomorrow.  Total time spent in this encounter was 21 minutes which included reviewing the patient's most recent abdominal ultrasound, interval history, physical examination, and documentation.  ____________________________________   Rhonda Promise, PhD, MD  This document serves as a record of services personally performed by Rhonda Pray, MD. It was created on his behalf by [name], a trained medical scribe. The creation of this record is based on the scribe's personal observations and the provider's statements to them. This document has been checked and approved by the attending  provider.

## 2019-10-27 ENCOUNTER — Ambulatory Visit (HOSPITAL_COMMUNITY)
Admission: RE | Admit: 2019-10-27 | Discharge: 2019-10-27 | Disposition: A | Discharge status: Home / Self Care | Payer: Medicare Other | Source: Ambulatory Visit | Attending: Oncology | Admitting: Oncology

## 2019-10-27 ENCOUNTER — Encounter: Payer: Self-pay | Admitting: Radiation Oncology

## 2019-10-27 ENCOUNTER — Other Ambulatory Visit: Payer: Self-pay

## 2019-10-27 ENCOUNTER — Other Ambulatory Visit: Payer: Medicare Other

## 2019-10-27 ENCOUNTER — Ambulatory Visit
Admission: RE | Admit: 2019-10-27 | Discharge: 2019-10-27 | Disposition: A | Discharge status: Home / Self Care | Payer: Medicare Other | Source: Ambulatory Visit | Attending: Radiation Oncology | Admitting: Radiation Oncology

## 2019-10-27 ENCOUNTER — Inpatient Hospital Stay: Payer: Medicare Other | Attending: Adult Health

## 2019-10-27 VITALS — BP 123/57 | HR 66 | Temp 97.3°F | Resp 18 | Ht 65.0 in

## 2019-10-27 DIAGNOSIS — Z86718 Personal history of other venous thrombosis and embolism: Secondary | ICD-10-CM | POA: Insufficient documentation

## 2019-10-27 DIAGNOSIS — Z08 Encounter for follow-up examination after completed treatment for malignant neoplasm: Secondary | ICD-10-CM | POA: Diagnosis not present

## 2019-10-27 DIAGNOSIS — Z853 Personal history of malignant neoplasm of breast: Secondary | ICD-10-CM | POA: Insufficient documentation

## 2019-10-27 DIAGNOSIS — M818 Other osteoporosis without current pathological fracture: Secondary | ICD-10-CM

## 2019-10-27 DIAGNOSIS — Z170 Estrogen receptor positive status [ER+]: Secondary | ICD-10-CM | POA: Diagnosis not present

## 2019-10-27 DIAGNOSIS — C801 Malignant (primary) neoplasm, unspecified: Secondary | ICD-10-CM | POA: Diagnosis present

## 2019-10-27 DIAGNOSIS — Z7901 Long term (current) use of anticoagulants: Secondary | ICD-10-CM | POA: Insufficient documentation

## 2019-10-27 DIAGNOSIS — J90 Pleural effusion, not elsewhere classified: Secondary | ICD-10-CM | POA: Diagnosis not present

## 2019-10-27 DIAGNOSIS — G63 Polyneuropathy in diseases classified elsewhere: Secondary | ICD-10-CM | POA: Insufficient documentation

## 2019-10-27 DIAGNOSIS — Z7984 Long term (current) use of oral hypoglycemic drugs: Secondary | ICD-10-CM | POA: Insufficient documentation

## 2019-10-27 DIAGNOSIS — R7989 Other specified abnormal findings of blood chemistry: Secondary | ICD-10-CM

## 2019-10-27 DIAGNOSIS — Z8541 Personal history of malignant neoplasm of cervix uteri: Secondary | ICD-10-CM | POA: Insufficient documentation

## 2019-10-27 DIAGNOSIS — D612 Aplastic anemia due to other external agents: Secondary | ICD-10-CM | POA: Diagnosis not present

## 2019-10-27 DIAGNOSIS — C50811 Malignant neoplasm of overlapping sites of right female breast: Secondary | ICD-10-CM

## 2019-10-27 DIAGNOSIS — Z923 Personal history of irradiation: Secondary | ICD-10-CM | POA: Diagnosis not present

## 2019-10-27 DIAGNOSIS — C772 Secondary and unspecified malignant neoplasm of intra-abdominal lymph nodes: Secondary | ICD-10-CM | POA: Insufficient documentation

## 2019-10-27 DIAGNOSIS — Z79899 Other long term (current) drug therapy: Secondary | ICD-10-CM | POA: Insufficient documentation

## 2019-10-27 DIAGNOSIS — C569 Malignant neoplasm of unspecified ovary: Secondary | ICD-10-CM | POA: Diagnosis present

## 2019-10-27 DIAGNOSIS — K7460 Unspecified cirrhosis of liver: Secondary | ICD-10-CM | POA: Diagnosis not present

## 2019-10-27 DIAGNOSIS — R161 Splenomegaly, not elsewhere classified: Secondary | ICD-10-CM

## 2019-10-27 DIAGNOSIS — Z8579 Personal history of other malignant neoplasms of lymphoid, hematopoietic and related tissues: Secondary | ICD-10-CM | POA: Diagnosis not present

## 2019-10-27 DIAGNOSIS — K744 Secondary biliary cirrhosis: Secondary | ICD-10-CM

## 2019-10-27 DIAGNOSIS — C562 Malignant neoplasm of left ovary: Secondary | ICD-10-CM | POA: Insufficient documentation

## 2019-10-27 DIAGNOSIS — C50919 Malignant neoplasm of unspecified site of unspecified female breast: Secondary | ICD-10-CM | POA: Diagnosis not present

## 2019-10-27 DIAGNOSIS — K760 Fatty (change of) liver, not elsewhere classified: Secondary | ICD-10-CM

## 2019-10-27 DIAGNOSIS — D696 Thrombocytopenia, unspecified: Secondary | ICD-10-CM | POA: Insufficient documentation

## 2019-10-27 LAB — CBC WITH DIFFERENTIAL/PLATELET
Abs Immature Granulocytes: 0.01 10*3/uL (ref 0.00–0.07)
Basophils Absolute: 0 10*3/uL (ref 0.0–0.1)
Basophils Relative: 1 %
Eosinophils Absolute: 0.4 10*3/uL (ref 0.0–0.5)
Eosinophils Relative: 10 %
HCT: 28.7 % — ABNORMAL LOW (ref 36.0–46.0)
Hemoglobin: 8.7 g/dL — ABNORMAL LOW (ref 12.0–15.0)
Immature Granulocytes: 0 %
Lymphocytes Relative: 11 %
Lymphs Abs: 0.5 10*3/uL — ABNORMAL LOW (ref 0.7–4.0)
MCH: 35.2 pg — ABNORMAL HIGH (ref 26.0–34.0)
MCHC: 30.3 g/dL (ref 30.0–36.0)
MCV: 116.2 fL — ABNORMAL HIGH (ref 80.0–100.0)
Monocytes Absolute: 0.3 10*3/uL (ref 0.1–1.0)
Monocytes Relative: 7 %
Neutro Abs: 2.8 10*3/uL (ref 1.7–7.7)
Neutrophils Relative %: 71 %
Platelets: 84 10*3/uL — ABNORMAL LOW (ref 150–400)
RBC: 2.47 MIL/uL — ABNORMAL LOW (ref 3.87–5.11)
RDW: 17.2 % — ABNORMAL HIGH (ref 11.5–15.5)
WBC: 4 10*3/uL (ref 4.0–10.5)
nRBC: 0 % (ref 0.0–0.2)

## 2019-10-27 LAB — COMPREHENSIVE METABOLIC PANEL
ALT: 20 U/L (ref 0–44)
AST: 37 U/L (ref 15–41)
Albumin: 2.6 g/dL — ABNORMAL LOW (ref 3.5–5.0)
Alkaline Phosphatase: 100 U/L (ref 38–126)
Anion gap: 10 (ref 5–15)
BUN: 7 mg/dL — ABNORMAL LOW (ref 8–23)
CO2: 21 mmol/L — ABNORMAL LOW (ref 22–32)
Calcium: 7.7 mg/dL — ABNORMAL LOW (ref 8.9–10.3)
Chloride: 110 mmol/L (ref 98–111)
Creatinine, Ser: 0.63 mg/dL (ref 0.44–1.00)
GFR calc Af Amer: >60 mL/min (ref >60)
GFR calc non Af Amer: >60 mL/min (ref >60)
Glucose, Bld: 65 mg/dL — ABNORMAL LOW (ref 70–99)
Potassium: 4 mmol/L (ref 3.5–5.1)
Sodium: 141 mmol/L (ref 135–145)
Total Bilirubin: 1.7 mg/dL — ABNORMAL HIGH (ref 0.3–1.2)
Total Protein: 6.1 g/dL — ABNORMAL LOW (ref 6.5–8.1)

## 2019-10-27 LAB — PROTIME-INR
INR: 1.5 — ABNORMAL HIGH (ref 0.8–1.2)
Prothrombin Time: 17.8 seconds — ABNORMAL HIGH (ref 11.4–15.2)

## 2019-10-27 LAB — GLUCOSE, CAPILLARY: Glucose-Capillary: 65 mg/dL — ABNORMAL LOW (ref 70–99)

## 2019-10-27 MED ORDER — FLUDEOXYGLUCOSE F - 18 (FDG) INJECTION
8.0000 | Freq: Once | INTRAVENOUS | Status: AC | PRN
  Administered 2019-10-27: 8 via INTRAVENOUS

## 2019-10-27 NOTE — Progress Notes (Signed)
Patient in for follow up doing well denies any issues or complaints of. Last treatment was in February.Has a PET scheduled for today. No concerns

## 2019-10-27 NOTE — Patient Instructions (Signed)
Coronavirus (COVID-19) Are you at risk?  Are you at risk for the Coronavirus (COVID-19)?  To be considered HIGH RISK for Coronavirus (COVID-19), you have to meet the following criteria:  . Traveled to [place], [place], [name], [place] or [place]; or in the [place] to [place], [place], [place], or [place]; and have fever, cough, and shortness of breath within the last 2 weeks of travel OR . Been in close contact with a person diagnosed with COVID-19 within the last 2 weeks and have fever, cough, and shortness of breath . IF YOU DO NOT MEET THESE CRITERIA, YOU ARE CONSIDERED LOW RISK FOR COVID-19.  What to do if you are HIGH RISK for COVID-19?  . If you are having a medical emergency, call 911. . Seek medical care right away. Before you go to a doctor's office, urgent care or emergency department, call ahead and tell them about your recent travel, contact with someone diagnosed with COVID-19, and your symptoms. You should receive instructions from your physician's office regarding next steps of care.  . When you arrive at healthcare provider, tell the healthcare staff immediately you have returned from visiting [place], [place], [place], [place] or [name]; or traveled in the [place] to [place], [place], [place], or [place]; in the last two weeks or you have been in close contact with a person diagnosed with COVID-19 in the last 2 weeks.   . Tell the health care staff about your symptoms: fever, cough and shortness of breath. . After you have been seen by a medical provider, you will be either: o Tested for (COVID-19) and discharged home on quarantine except to seek medical care if symptoms worsen, and asked to  - Stay home and avoid contact with others until you get your results (4-5 days)  - Avoid travel on public transportation if possible (such as bus, train, or airplane) or o Sent to the Emergency Department by EMS for evaluation, COVID-19 testing, and possible  admission depending on your condition and test results.  What to do if you are LOW RISK for COVID-19?  Reduce your risk of any infection by using the same precautions used for avoiding the common cold or flu:  . Wash your hands often with soap and warm water for at least 20 seconds.  If soap and water are not readily available, use an alcohol-based hand sanitizer with at least 60% alcohol.  . If coughing or sneezing, cover your mouth and nose by coughing or sneezing into the elbow areas of your shirt or coat, into a tissue or into your sleeve (not your hands). . Avoid shaking hands with others and consider head nods or verbal greetings only. . Avoid touching your eyes, nose, or mouth with unwashed hands.  . Avoid close contact with people who are sick. . Avoid places or events with large numbers of people in one location, like concerts or sporting events. . Carefully consider travel plans you have or are making. . If you are planning any travel outside or inside the [place], visit the CDC's Travelers' Health webpage for the latest health notices. . If you have some symptoms but not all symptoms, continue to monitor at home and seek medical attention if your symptoms worsen. . If you are having a medical emergency, call 911.   ADDITIONAL HEALTHCARE OPTIONS FOR PATIENTS  [facility] / e-Visit: [email or web address]         [facility]: [phone]  [facility]: [phone]                   [facility]: [phone]   

## 2019-10-28 LAB — AFP TUMOR MARKER: AFP, Serum, Tumor Marker: 2.9 ng/mL (ref 0.0–8.3)

## 2019-10-28 LAB — CA 125: Cancer Antigen (CA) 125: 351 U/mL — ABNORMAL HIGH (ref 0.0–38.1)

## 2019-10-29 ENCOUNTER — Emergency Department (HOSPITAL_COMMUNITY)
Admission: EM | Admit: 2019-10-29 | Discharge: 2019-10-29 | Disposition: A | Discharge status: Home / Self Care | Payer: Medicare Other | Attending: Emergency Medicine | Admitting: Emergency Medicine

## 2019-10-29 ENCOUNTER — Encounter (HOSPITAL_COMMUNITY): Payer: Self-pay | Admitting: *Deleted

## 2019-10-29 ENCOUNTER — Other Ambulatory Visit: Payer: Self-pay | Admitting: Oncology

## 2019-10-29 ENCOUNTER — Other Ambulatory Visit: Payer: Self-pay

## 2019-10-29 DIAGNOSIS — Z87891 Personal history of nicotine dependence: Secondary | ICD-10-CM | POA: Insufficient documentation

## 2019-10-29 DIAGNOSIS — Z9012 Acquired absence of left breast and nipple: Secondary | ICD-10-CM | POA: Diagnosis not present

## 2019-10-29 DIAGNOSIS — E119 Type 2 diabetes mellitus without complications: Secondary | ICD-10-CM | POA: Diagnosis not present

## 2019-10-29 DIAGNOSIS — I10 Essential (primary) hypertension: Secondary | ICD-10-CM | POA: Insufficient documentation

## 2019-10-29 DIAGNOSIS — S4991XA Unspecified injury of right shoulder and upper arm, initial encounter: Secondary | ICD-10-CM | POA: Diagnosis present

## 2019-10-29 DIAGNOSIS — S5001XA Contusion of right elbow, initial encounter: Secondary | ICD-10-CM | POA: Diagnosis not present

## 2019-10-29 DIAGNOSIS — Y999 Unspecified external cause status: Secondary | ICD-10-CM | POA: Diagnosis not present

## 2019-10-29 DIAGNOSIS — Z7984 Long term (current) use of oral hypoglycemic drugs: Secondary | ICD-10-CM | POA: Diagnosis not present

## 2019-10-29 DIAGNOSIS — Y92009 Unspecified place in unspecified non-institutional (private) residence as the place of occurrence of the external cause: Secondary | ICD-10-CM | POA: Insufficient documentation

## 2019-10-29 DIAGNOSIS — Z79899 Other long term (current) drug therapy: Secondary | ICD-10-CM | POA: Diagnosis not present

## 2019-10-29 DIAGNOSIS — S40021A Contusion of right upper arm, initial encounter: Secondary | ICD-10-CM | POA: Diagnosis not present

## 2019-10-29 DIAGNOSIS — Y939 Activity, unspecified: Secondary | ICD-10-CM | POA: Diagnosis not present

## 2019-10-29 DIAGNOSIS — S20319A Abrasion of unspecified front wall of thorax, initial encounter: Secondary | ICD-10-CM | POA: Insufficient documentation
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
## 2019-11-01 MED FILL — TOREMIFENE CITRATE 60 MG TA: 60 | 30 days supply | Qty: 30 | Fill #3

## 2019-11-13 ENCOUNTER — Emergency Department (HOSPITAL_COMMUNITY): Payer: Medicare Other

## 2019-11-13 ENCOUNTER — Encounter (HOSPITAL_COMMUNITY): Payer: Self-pay | Admitting: Emergency Medicine

## 2019-11-13 ENCOUNTER — Inpatient Hospital Stay (HOSPITAL_COMMUNITY)
Admission: EM | Admit: 2019-11-13 | Discharge: 2019-11-15 | DRG: 441 | Disposition: A | Discharge status: Hospice | Payer: Medicare Other | Attending: Internal Medicine | Admitting: Internal Medicine

## 2019-11-13 ENCOUNTER — Other Ambulatory Visit: Payer: Self-pay

## 2019-11-13 DIAGNOSIS — E872 Acidosis: Secondary | ICD-10-CM | POA: Diagnosis present

## 2019-11-13 DIAGNOSIS — N3001 Acute cystitis with hematuria: Secondary | ICD-10-CM | POA: Diagnosis present

## 2019-11-13 DIAGNOSIS — Z8261 Family history of arthritis: Secondary | ICD-10-CM

## 2019-11-13 DIAGNOSIS — Z90710 Acquired absence of both cervix and uterus: Secondary | ICD-10-CM

## 2019-11-13 DIAGNOSIS — G9340 Encephalopathy, unspecified: Secondary | ICD-10-CM

## 2019-11-13 DIAGNOSIS — K7460 Unspecified cirrhosis of liver: Secondary | ICD-10-CM | POA: Diagnosis present

## 2019-11-13 DIAGNOSIS — D539 Nutritional anemia, unspecified: Secondary | ICD-10-CM | POA: Diagnosis not present

## 2019-11-13 DIAGNOSIS — E1151 Type 2 diabetes mellitus with diabetic peripheral angiopathy without gangrene: Secondary | ICD-10-CM | POA: Diagnosis not present

## 2019-11-13 DIAGNOSIS — I110 Hypertensive heart disease with heart failure: Secondary | ICD-10-CM | POA: Diagnosis not present

## 2019-11-13 DIAGNOSIS — Z9221 Personal history of antineoplastic chemotherapy: Secondary | ICD-10-CM

## 2019-11-13 DIAGNOSIS — Z881 Allergy status to other antibiotic agents status: Secondary | ICD-10-CM

## 2019-11-13 DIAGNOSIS — E1165 Type 2 diabetes mellitus with hyperglycemia: Secondary | ICD-10-CM | POA: Diagnosis not present

## 2019-11-13 DIAGNOSIS — Z7189 Other specified counseling: Secondary | ICD-10-CM | POA: Diagnosis not present

## 2019-11-13 DIAGNOSIS — Z9049 Acquired absence of other specified parts of digestive tract: Secondary | ICD-10-CM

## 2019-11-13 DIAGNOSIS — D62 Acute posthemorrhagic anemia: Secondary | ICD-10-CM | POA: Diagnosis present

## 2019-11-13 DIAGNOSIS — I10 Essential (primary) hypertension: Secondary | ICD-10-CM | POA: Diagnosis present

## 2019-11-13 DIAGNOSIS — Z79899 Other long term (current) drug therapy: Secondary | ICD-10-CM

## 2019-11-13 DIAGNOSIS — E039 Hypothyroidism, unspecified: Secondary | ICD-10-CM | POA: Diagnosis not present

## 2019-11-13 DIAGNOSIS — Z853 Personal history of malignant neoplasm of breast: Secondary | ICD-10-CM

## 2019-11-13 DIAGNOSIS — Z66 Do not resuscitate: Secondary | ICD-10-CM | POA: Diagnosis not present

## 2019-11-13 DIAGNOSIS — Z86718 Personal history of other venous thrombosis and embolism: Secondary | ICD-10-CM

## 2019-11-13 DIAGNOSIS — Z515 Encounter for palliative care: Secondary | ICD-10-CM | POA: Diagnosis not present

## 2019-11-13 DIAGNOSIS — Z888 Allergy status to other drugs, medicaments and biological substances status: Secondary | ICD-10-CM

## 2019-11-13 DIAGNOSIS — R7989 Other specified abnormal findings of blood chemistry: Secondary | ICD-10-CM | POA: Diagnosis not present

## 2019-11-13 DIAGNOSIS — K7200 Acute and subacute hepatic failure without coma: Principal | ICD-10-CM | POA: Diagnosis present

## 2019-11-13 DIAGNOSIS — Z8249 Family history of ischemic heart disease and other diseases of the circulatory system: Secondary | ICD-10-CM

## 2019-11-13 DIAGNOSIS — G9341 Metabolic encephalopathy: Secondary | ICD-10-CM | POA: Diagnosis not present

## 2019-11-13 DIAGNOSIS — Z923 Personal history of irradiation: Secondary | ICD-10-CM | POA: Diagnosis not present

## 2019-11-13 DIAGNOSIS — Z800 Family history of malignant neoplasm of digestive organs: Secondary | ICD-10-CM | POA: Diagnosis not present

## 2019-11-13 DIAGNOSIS — Z90722 Acquired absence of ovaries, bilateral: Secondary | ICD-10-CM

## 2019-11-13 DIAGNOSIS — D72819 Decreased white blood cell count, unspecified: Secondary | ICD-10-CM | POA: Diagnosis present

## 2019-11-13 DIAGNOSIS — J9601 Acute respiratory failure with hypoxia: Secondary | ICD-10-CM | POA: Diagnosis present

## 2019-11-13 DIAGNOSIS — K760 Fatty (change of) liver, not elsewhere classified: Secondary | ICD-10-CM | POA: Diagnosis present

## 2019-11-13 DIAGNOSIS — R4182 Altered mental status, unspecified: Secondary | ICD-10-CM

## 2019-11-13 DIAGNOSIS — K921 Melena: Secondary | ICD-10-CM | POA: Diagnosis present

## 2019-11-13 DIAGNOSIS — D6959 Other secondary thrombocytopenia: Secondary | ICD-10-CM | POA: Diagnosis present

## 2019-11-13 DIAGNOSIS — R0602 Shortness of breath: Secondary | ICD-10-CM | POA: Diagnosis not present

## 2019-11-13 DIAGNOSIS — Z833 Family history of diabetes mellitus: Secondary | ICD-10-CM

## 2019-11-13 DIAGNOSIS — I5042 Chronic combined systolic (congestive) and diastolic (congestive) heart failure: Secondary | ICD-10-CM | POA: Diagnosis not present

## 2019-11-13 DIAGNOSIS — I517 Cardiomegaly: Secondary | ICD-10-CM | POA: Diagnosis not present

## 2019-11-13 DIAGNOSIS — K744 Secondary biliary cirrhosis: Secondary | ICD-10-CM | POA: Diagnosis not present

## 2019-11-13 DIAGNOSIS — Z9011 Acquired absence of right breast and nipple: Secondary | ICD-10-CM

## 2019-11-13 DIAGNOSIS — Z03818 Encounter for observation for suspected exposure to other biological agents ruled out: Secondary | ICD-10-CM | POA: Diagnosis not present

## 2019-11-13 DIAGNOSIS — I482 Chronic atrial fibrillation, unspecified: Secondary | ICD-10-CM | POA: Diagnosis present

## 2019-11-13 DIAGNOSIS — Z8543 Personal history of malignant neoplasm of ovary: Secondary | ICD-10-CM

## 2019-11-13 DIAGNOSIS — E861 Hypovolemia: Secondary | ICD-10-CM | POA: Diagnosis present

## 2019-11-13 DIAGNOSIS — Z823 Family history of stroke: Secondary | ICD-10-CM

## 2019-11-13 DIAGNOSIS — IMO0002 Reserved for concepts with insufficient information to code with codable children: Secondary | ICD-10-CM | POA: Diagnosis present

## 2019-11-13 DIAGNOSIS — G4733 Obstructive sleep apnea (adult) (pediatric): Secondary | ICD-10-CM | POA: Diagnosis not present

## 2019-11-13 DIAGNOSIS — Z87891 Personal history of nicotine dependence: Secondary | ICD-10-CM

## 2019-11-13 DIAGNOSIS — K7682 Hepatic encephalopathy: Secondary | ICD-10-CM | POA: Diagnosis present

## 2019-11-13 DIAGNOSIS — Z7984 Long term (current) use of oral hypoglycemic drugs: Secondary | ICD-10-CM

## 2019-11-13 DIAGNOSIS — Z20822 Contact with and (suspected) exposure to covid-19: Secondary | ICD-10-CM | POA: Diagnosis not present

## 2019-11-13 DIAGNOSIS — J90 Pleural effusion, not elsewhere classified: Secondary | ICD-10-CM | POA: Diagnosis not present

## 2019-11-13 DIAGNOSIS — E7220 Disorder of urea cycle metabolism, unspecified: Secondary | ICD-10-CM | POA: Diagnosis not present

## 2019-11-13 DIAGNOSIS — I700 Atherosclerosis of aorta: Secondary | ICD-10-CM | POA: Diagnosis not present

## 2019-11-13 LAB — CBC
HCT: 24.7 % — ABNORMAL LOW (ref 36.0–46.0)
Hemoglobin: 7.3 g/dL — ABNORMAL LOW (ref 12.0–15.0)
MCH: 33.2 pg (ref 26.0–34.0)
MCHC: 29.6 g/dL — ABNORMAL LOW (ref 30.0–36.0)
MCV: 112.3 fL — ABNORMAL HIGH (ref 80.0–100.0)
RBC: 2.2 MIL/uL — ABNORMAL LOW (ref 3.87–5.11)
RDW: 15.8 % — ABNORMAL HIGH (ref 11.5–15.5)
WBC: 6.6 10*3/uL (ref 4.0–10.5)
nRBC: 0.3 % — ABNORMAL HIGH (ref 0.0–0.2)

## 2019-11-13 LAB — I-STAT VENOUS BLOOD GAS, ED
Acid-base deficit: 4 mmol/L — ABNORMAL HIGH (ref 0.0–2.0)
Bicarbonate: 17.8 mmol/L — ABNORMAL LOW (ref 20.0–28.0)
Calcium, Ion: 1.06 mmol/L — ABNORMAL LOW (ref 1.15–1.40)
HCT: 19 % — ABNORMAL LOW (ref 36.0–46.0)
Hemoglobin: 6.5 g/dL — CL (ref 12.0–15.0)
O2 Saturation: 91 %
Potassium: 3.4 mmol/L — ABNORMAL LOW (ref 3.5–5.1)
Sodium: 146 mmol/L — ABNORMAL HIGH (ref 135–145)
TCO2: 18 mmol/L — ABNORMAL LOW (ref 22–32)
pCO2, Ven: 19 mmHg — CL (ref 44.0–60.0)
pH, Ven: 7.58 — ABNORMAL HIGH (ref 7.250–7.430)
pO2, Ven: 48 mmHg — ABNORMAL HIGH (ref 32.0–45.0)

## 2019-11-13 LAB — LACTIC ACID, PLASMA
Lactic Acid, Venous: 3.1 mmol/L (ref 0.5–1.9)
Lactic Acid, Venous: 3.5 mmol/L (ref 0.5–1.9)

## 2019-11-13 LAB — HIV ANTIBODY (ROUTINE TESTING W REFLEX): HIV Screen 4th Generation wRfx: NONREACTIVE

## 2019-11-13 LAB — POC OCCULT BLOOD, ED: Fecal Occult Bld: POSITIVE — AB

## 2019-11-13 LAB — CBC WITH DIFFERENTIAL/PLATELET
Abs Immature Granulocytes: 0.1 10*3/uL — ABNORMAL HIGH (ref 0.00–0.07)
Basophils Absolute: 0.1 10*3/uL (ref 0.0–0.1)
Basophils Relative: 1 %
Eosinophils Absolute: 0 10*3/uL (ref 0.0–0.5)
Eosinophils Relative: 0 %
HCT: 24 % — ABNORMAL LOW (ref 36.0–46.0)
Hemoglobin: 7.2 g/dL — ABNORMAL LOW (ref 12.0–15.0)
Lymphocytes Relative: 4 %
Lymphs Abs: 0.3 10*3/uL — ABNORMAL LOW (ref 0.7–4.0)
MCH: 33.3 pg (ref 26.0–34.0)
MCHC: 30 g/dL (ref 30.0–36.0)
MCV: 111.1 fL — ABNORMAL HIGH (ref 80.0–100.0)
Monocytes Absolute: 0 10*3/uL — ABNORMAL LOW (ref 0.1–1.0)
Monocytes Relative: 0 %
Neutro Abs: 6.2 10*3/uL (ref 1.7–7.7)
Neutrophils Relative %: 94 %
Promyelocytes Relative: 1 %
RBC: 2.16 MIL/uL — ABNORMAL LOW (ref 3.87–5.11)
RDW: 15.4 % (ref 11.5–15.5)
WBC: 6.6 10*3/uL (ref 4.0–10.5)
nRBC: 0 /100 WBC
nRBC: 0.3 % — ABNORMAL HIGH (ref 0.0–0.2)

## 2019-11-13 LAB — URINALYSIS, COMPLETE (UACMP) WITH MICROSCOPIC
Bilirubin Urine: NEGATIVE
Glucose, UA: NEGATIVE mg/dL
Ketones, ur: 5 mg/dL — AB
Nitrite: NEGATIVE
Protein, ur: 30 mg/dL — AB
Specific Gravity, Urine: 1.018 (ref 1.005–1.030)
WBC, UA: >50 WBC/hpf — ABNORMAL HIGH (ref 0–5)
pH: 5 (ref 5.0–8.0)

## 2019-11-13 LAB — AMMONIA: Ammonia: 119 umol/L — ABNORMAL HIGH (ref 9–35)

## 2019-11-13 LAB — COMPREHENSIVE METABOLIC PANEL
ALT: 26 U/L (ref 0–44)
AST: 81 U/L — ABNORMAL HIGH (ref 15–41)
Albumin: 2.3 g/dL — ABNORMAL LOW (ref 3.5–5.0)
Alkaline Phosphatase: 76 U/L (ref 38–126)
Anion gap: 14 (ref 5–15)
BUN: 12 mg/dL (ref 8–23)
CO2: 16 mmol/L — ABNORMAL LOW (ref 22–32)
Calcium: 7.9 mg/dL — ABNORMAL LOW (ref 8.9–10.3)
Chloride: 112 mmol/L — ABNORMAL HIGH (ref 98–111)
Creatinine, Ser: 0.65 mg/dL (ref 0.44–1.00)
GFR calc Af Amer: >60 mL/min (ref >60)
GFR calc non Af Amer: >60 mL/min (ref >60)
Glucose, Bld: 98 mg/dL (ref 70–99)
Potassium: 4 mmol/L (ref 3.5–5.1)
Sodium: 142 mmol/L (ref 135–145)
Total Bilirubin: 1.7 mg/dL — ABNORMAL HIGH (ref 0.3–1.2)
Total Protein: 5.8 g/dL — ABNORMAL LOW (ref 6.5–8.1)

## 2019-11-13 LAB — HEPATITIS PANEL, ACUTE
HCV Ab: NONREACTIVE
Hep A IgM: NONREACTIVE
Hep B C IgM: NONREACTIVE
Hepatitis B Surface Ag: NONREACTIVE

## 2019-11-13 LAB — PLATELET COUNT: Platelets: 83 10*3/uL — ABNORMAL LOW (ref 150–400)

## 2019-11-13 LAB — TSH: TSH: 1.686 u[IU]/mL (ref 0.350–4.500)

## 2019-11-13 LAB — BRAIN NATRIURETIC PEPTIDE: B Natriuretic Peptide: 854 pg/mL — ABNORMAL HIGH (ref 0.0–100.0)

## 2019-11-13 LAB — CBG MONITORING, ED
Glucose-Capillary: 87 mg/dL (ref 70–99)
Glucose-Capillary: 104 mg/dL — ABNORMAL HIGH (ref 70–99)

## 2019-11-13 LAB — PREPARE RBC (CROSSMATCH)

## 2019-11-13 LAB — APTT: aPTT: 40 seconds — ABNORMAL HIGH (ref 24–36)

## 2019-11-13 LAB — SARS CORONAVIRUS 2 BY RT PCR (HOSPITAL ORDER, PERFORMED IN ~~LOC~~ HOSPITAL LAB): SARS Coronavirus 2: NEGATIVE

## 2019-11-13 LAB — PROTIME-INR
INR: 1.9 — ABNORMAL HIGH (ref 0.8–1.2)
Prothrombin Time: 21.5 seconds — ABNORMAL HIGH (ref 11.4–15.2)

## 2019-11-13 MED ORDER — SODIUM CHLORIDE 0.9 % IV SOLN
1.0000 g | Freq: Every day | INTRAVENOUS | Status: DC
  Administered 2019-11-14: 1 g via INTRAVENOUS
  Filled 2019-11-13: qty 10

## 2019-11-13 MED ORDER — LORAZEPAM 2 MG/ML IJ SOLN
0.5000 mg | Freq: Once | INTRAMUSCULAR | Status: AC
  Administered 2019-11-14: 0.5 mg via INTRAVENOUS
  Filled 2019-11-13: qty 1

## 2019-11-13 MED ORDER — ONDANSETRON HCL 4 MG PO TABS: 4.0000 mg | ORAL_TABLET | Freq: Four times a day (QID) | ORAL | Status: DC | PRN

## 2019-11-13 MED ORDER — SODIUM CHLORIDE 0.9% FLUSH: 3.0000 mL | Freq: Once | INTRAVENOUS | Status: DC

## 2019-11-13 MED ORDER — LEVOTHYROXINE SODIUM 75 MCG PO TABS: 150.0000 ug | ORAL_TABLET | Freq: Every day | ORAL | Status: DC

## 2019-11-13 MED ORDER — LACTULOSE ENEMA
300.0000 mL | Freq: Two times a day (BID) | ORAL | Status: DC
  Administered 2019-11-13: 300 mL via RECTAL
  Filled 2019-11-13 (×4): qty 300

## 2019-11-13 MED ORDER — SODIUM CHLORIDE 0.9% IV SOLUTION: Freq: Once | INTRAVENOUS | Status: AC

## 2019-11-13 MED ORDER — LACTULOSE 10 GM/15ML PO SOLN
30.0000 g | Freq: Once | ORAL | Status: AC
  Administered 2019-11-13: 10 g via ORAL
  Filled 2019-11-13: qty 60

## 2019-11-13 MED ORDER — SODIUM CHLORIDE 0.9 % IV BOLUS: 1000.0000 mL | Freq: Once | INTRAVENOUS | Status: DC

## 2019-11-13 MED ORDER — PROPRANOLOL HCL 20 MG PO TABS
20.0000 mg | ORAL_TABLET | Freq: Two times a day (BID) | ORAL | Status: DC
  Filled 2019-11-13 (×5): qty 1

## 2019-11-13 MED ORDER — DEXTROSE-NACL 5-0.45 % IV SOLN: INTRAVENOUS | Status: DC

## 2019-11-13 MED ORDER — LACTATED RINGERS IV SOLN: INTRAVENOUS | Status: AC

## 2019-11-13 MED ORDER — SODIUM CHLORIDE 0.9 % IV SOLN
2.0000 g | Freq: Once | INTRAVENOUS | Status: AC
  Administered 2019-11-13: 2 g via INTRAVENOUS
  Filled 2019-11-13: qty 20

## 2019-11-13 MED ORDER — ONDANSETRON HCL 4 MG/2ML IJ SOLN: 4.0000 mg | Freq: Four times a day (QID) | INTRAMUSCULAR | Status: DC | PRN

## 2019-11-13 NOTE — ED Notes (Signed)
Pt transported to CT ?

## 2019-11-13 NOTE — ED Notes (Signed)
Report given to 5N RN. All questions answered 
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
## 2019-11-13 NOTE — ED Provider Notes (Signed)
[facility] Provider Note   CSN: [id number] Arrival date & time: 11/13/19  1231     History Chief Complaint  Patient presents with  . Altered Mental Status    [name] is a 64 y.o. female.  HPI  Patient is a 64 year old female with past medical history of fatty liver cirrhosis, breast cancer, ovarian cancer, history of esophageal varices.   Level 4 caveat due to altered mental status. Patient is able to answer yes/no questions and states no chest pain, shortness of breath, headache, abdominal pain.  History obtained from husband who was at bedside.  Per husband patient at her baseline is able to communicate and answer questions is alert oriented.   He states that at approximately noon yesterday he noticed that patient seemed to be severely more lethargic and was answering questions oddly.  He notes that she was talking to her dead mother as if she was there.  He states that her symptoms seem to worsen and this morning she was less responsive and was only answering yes/no questions or short phrases.  He states she has not had issues with this in the past.  He states that she has not been complaining of any urinary symptoms and has not been coughing or had any fevers.  He states that before her symptoms began she was well-appearing and at her baseline.  Denies any vomiting or complaints of headache.   On my review of EMR she is followed by [facility] with [facility] and appears that she has progressive ovarian cancer with retroperitoneal lymphadenopathy.  She has been treated with radiation and chemotherapy per her oncologist Magrinat.  She has chronic leukopenia and anemia with baseline hemoglobin below 7s.  Per impression of her PET scan from 11/20 she has worsening retroperitoneal lymphadenopathy, cirrhosis, small volume ascites, moderate right pleural effusion. Patient does not appear to be on lactulose.  Has not had any episodes of  altered mental status [facility] which is noted by her gastroenterologist.     Past Medical History:  Diagnosis Date  . Allergy   . Anemia    low iron hx  . Arthritis   . Breast cancer ([place]) 2014   a. 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 Cataract    removed ou  . Chronic combined systolic and diastolic CHF (congestive heart failure) ([place])    a. 08/2012 Echo: EF 55-60%, no rwma, mod MR;  b. 07/2014 Echo: EF 45-50%, distal antsept HK, mod TR/MR, mildly bil-atrial enlargement.  . Clear Cell Ovarian Cancer    a. 06/2014 s/p TAH/BSO debulking/lymph node dissection;  b. Now on chemo.  . Depression   . Diabetes mellitus   . DJD (degenerative joint disease) of lumbar spine   . DVT (deep venous thrombosis) (HCC)    hx of on HRT left leg ~2006  . Gallstones   . GERD (gastroesophageal reflux disease)   . Hyperlipidemia   . Hypertension   . Hypothyroidism   . Liver disease, chronic, with cirrhosis ([place])    ? autoimmune  . OSA on CPAP    cpap 4.5 setting  . Paroxysmal atrial fibrillation (HCC)    a. CHA2DS2VASc = 4-->coumadin.  [name] Physical deconditioning   . PPD positive, treated    rx inh   . Sleep apnea     Patient Active Problem List   Diagnosis Date Noted  .  Acute metabolic encephalopathy 00/17/4944  . Acute hepatic encephalopathy 11/13/2019  . Macrocytic anemia 11/13/2019  . 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 Coronary atherosclerosis 09/02/2018  . Eye muscle paralysis 06/24/2018  . Neuropathy associated with cancer ([place]) 05/24/2018  . Urge incontinence of urine 04/25/2018  . Osteoporosis 11/09/2017  . DM (diabetes mellitus) type II uncontrolled, periph vascular disorder ([place]) 10/10/2016  . 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 Port catheter in place 12/17/2015  . Presence of IVC filter   . Chemotherapy-induced  neuropathy ([place]) 12/25/2014  . Goals of care, counseling/discussion 12/06/2014  . History of breast cancer in female 12/06/2014  . Family history of colon cancer 12/06/2014  . Paroxysmal atrial fibrillation ([place]) 09/25/2014  . Preventative health care 09/17/2014  . Chronic combined systolic and diastolic CHF (congestive heart failure) ([place])   . Essential hypertension   . Antineoplastic chemotherapy induced pancytopenia ([place])   . Malnutrition of moderate degree ([place]) 07/30/2014  . Elevated LFTs   . Weakness 07/29/2014  . Long term current use of anticoagulant therapy 07/29/2014  . Hyperbilirubinemia 07/29/2014  . Hypoalbuminemia 07/29/2014  . Chronic atrial fibrillation ([place]) 07/28/2014  . DM (diabetes mellitus), type 2, uncontrolled, with renal complications ([place]) 96/75/9163  . Malignant neoplasm of ovary ([place]) 07/06/2014  . Malignant neoplasm of overlapping sites of right breast in female, estrogen receptor positive ([place]) 04/03/2014  . Chronic diastolic CHF (congestive heart failure), NYHA class 1 ([place]) 09/16/2012  . DJD (degenerative joint disease) of lumbar spine   . Microcytic anemia 07/12/2012  . Thrombocytopenia ([place]) 07/12/2012  . Malignant neoplasm of female breast ([place]) 05/27/2012  . Allergic rhinitis, cause unspecified 07/28/2011  . Cirrhosis of liver ([place])   . IRON DEFICIENCY 06/08/2009  . Hepatic steatosis 09/28/2008  . SPLENOMEGALY 09/28/2008  . Unspecified vitamin D deficiency 08/16/2008  . 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 ARTHRITIS 03/30/2007  . Hypothyroidism 03/24/2007  . Obstructive sleep apnea 03/24/2007  . Hyperlipidemia 10/06/2006  . Depression 10/06/2006  . GERD 10/06/2006  . DVT, HX OF 10/06/2006    Past Surgical History:  Procedure Laterality Date  . ABDOMINAL HYSTERECTOMY N/A 06/27/2014   Procedure: HYSTERECTOMY ABDOMINAL TOTAL;  Surgeon: Everitt Amber, MD;  Location: [facility];  Service: Gynecology;  Laterality: N/A;  . BREAST BIOPSY     left breast  .  BREAST EXCISIONAL BIOPSY Left 2002  . CATARACT EXTRACTION    . CHOLECYSTECTOMY    . EYE SURGERY    . FOOT SURGERY    . history of chemotherapy x 2 treatments, radiation tx  2014  . LAPAROTOMY N/A 06/27/2014   Procedure: EXPLORATORY LAPAROTOMY;  Surgeon: Everitt Amber, MD;  Location: [facility];  Service: Gynecology;  Laterality: N/A;  . MASTECTOMY Right 2014  . MASTECTOMY MODIFIED RADICAL  06/08/2012   Procedure: MASTECTOMY MODIFIED RADICAL;  Surgeon: Edward Jolly, MD;  Location: [facility];  Service: General;  Laterality: Right;  . pac removed  2015  . PEG TUBE PLACEMENT     peg removed 2015  . PERCUTANEOUS LIVER BIOPSY    . PORTACATH PLACEMENT  06/08/2012   Procedure: INSERTION PORT-A-CATH;  Surgeon: Edward Jolly, MD;  Location: [facility];  Service: General;  Laterality: Left;  . SALPINGOOPHORECTOMY Bilateral 06/27/2014   Procedure: BILATERAL SALPINGO OOPHORECTOMY/TUMOR DEBULKING/LYMPHNODE DISSECTION, OMENTECTOMY;  Surgeon: Everitt Amber, MD;  Location: [facility];  Service: Gynecology;  Laterality: Bilateral;  . TUBAL LIGATION       OB History  Gravida  2   Para  2   Term      Preterm      AB      Living        SAB      TAB      Ectopic      Multiple      Live Births              Family History  Problem Relation Age of Onset  . Diabetes Mother   . Hypertension Mother   . Arthritis Mother   . Heart disease Mother   . Heart failure Mother   . Other Mother        benign breast mass  . Stroke Father   . Heart disease Father   . Dementia Father   . Diabetes Paternal Grandmother   . Colon cancer Paternal Grandfather        dx. 70s-80s  . Colon cancer Maternal Grandfather   . Other Daughter        potentially has had negative BRCA testing  . Other Daughter        hysterectomy; potentially has had negative genetic testing  . Rectal cancer Neg Hx   . Esophageal cancer Neg Hx   . Liver cancer Neg Hx     Social History   Tobacco Use  . Smoking status:  Former Smoker    Packs/day: 1.00    Years: 1.00    Pack years: 1.00  . Smokeless tobacco: Never Used  Vaping Use  . Vaping Use: Never used  Substance Use Topics  . Alcohol use: No  . Drug use: No    Comment: quit age 6 only smoked as a teen 1 year    Home Medications Prior to Admission medications   Medication Sig Start Date End Date Taking? Authorizing Provider  acetaminophen (TYLENOL) 500 MG tablet Take 500 mg by mouth every 6 (six) hours as needed for moderate pain.   Yes [provider]  ALPRAZolam (XANAX) 0.5 MG tablet TAKE 1 TABLET BY MOUTH  TWICE DAILY AS NEEDED FOR  ANXIETY Patient taking differently: Take 0.5 mg by mouth 2 (two) times daily as needed for anxiety.  10/11/19  Yes Magrinat, Virgie Dad, MD  buPROPion (WELLBUTRIN XL) 300 MG 24 hr tablet Take 1 tablet (300 mg total) by mouth daily. 08/05/19  Yes Magrinat, Virgie Dad, MD  escitalopram (LEXAPRO) 20 MG tablet TAKE 1 TABLET BY MOUTH IN  THE MORNING Patient taking differently: Take 20 mg by mouth daily.  03/21/19  Yes Roma Schanz R, DO  furosemide (LASIX) 20 MG tablet TAKE 1 TABLET BY MOUTH  DAILY Patient taking differently: Take 20 mg by mouth daily.  02/25/19  Yes Magrinat, Virgie Dad, MD  levothyroxine (SYNTHROID) 150 MCG tablet TAKE 1 TABLET BY MOUTH  DAILY Patient taking differently: Take 150 mcg by mouth daily before breakfast.  01/12/19  Yes Carollee Herter, Alferd Apa, DO  metFORMIN (GLUCOPHAGE-XR) 500 MG 24 hr tablet TAKE 2 TABLETS BY MOUTH  EVERY EVENING Patient taking differently: Take 500 mg by mouth in the morning and at bedtime.  01/12/19  Yes Lowne Chase, Yvonne R, DO  OLANZapine (ZYPREXA) 2.5 MG tablet TAKE 1 TABLET BY MOUTH IN  THE EVENING AT BEDTIME Patient taking differently: Take 2.5 mg by mouth at bedtime.  03/21/19  Yes Roma Schanz R, DO  potassium chloride (K-DUR) 10 MEQ tablet TAKE 1 TABLET BY MOUTH  TWICE DAILY Patient  taking differently: Take 10 mEq by mouth 2 (two) times daily.  01/24/19   Yes Roma Schanz R, DO  pravastatin (PRAVACHOL) 10 MG tablet TAKE 1 TABLET BY MOUTH  DAILY Patient taking differently: Take 10 mg by mouth daily.  03/21/19  Yes Roma Schanz R, DO  propranolol (INDERAL) 20 MG tablet TAKE 1 TABLET BY MOUTH  TWICE DAILY Patient taking differently: Take 20 mg by mouth 2 (two) times daily.  09/15/19  Yes Armbruster, Carlota Raspberry, MD  Toremifene Citrate (FARESTON) 60 MG tablet Take 1 tablet (60 mg total) by mouth daily. Start 08/05/2019 08/05/19  Yes Magrinat, Virgie Dad, MD  Blood Glucose Monitoring Suppl (ONETOUCH VERIO) w/Device KIT Use as directed twice a day.  Dx code: E11.9 01/12/19   Carollee Herter, Alferd Apa, DO  glucose blood (ONETOUCH VERIO) test strip USE TO CHECK BLOOD SUGAR  TWO TIMES DAILY. Dx Code: E11.9 01/12/19   Carollee Herter, Alferd Apa, DO  lidocaine-prilocaine (EMLA) cream APPLY TOPICALLY OVER PORT  AREA 1-2 HOURS BEFORE  CHEMOTHERAPY AS NEEDED Patient taking differently: Apply 1 application topically daily as needed (before chemotherapy).  02/15/18   Magrinat, Virgie Dad, MD  omeprazole (PRILOSEC) 20 MG capsule Take 1 capsule (20 mg total) by mouth daily. 02/02/18   Carollee Herter, Yvonne R, DO  ondansetron (ZOFRAN) 8 MG tablet TAKE 1 TABLET BY MOUTH  EVERY 8 HOURS AS NEEDED FOR NAUSEA OR VOMITING Patient taking differently: Take 8 mg by mouth every 8 (eight) hours as needed for nausea or vomiting.  04/11/19   Magrinat, Virgie Dad, MD  OneTouch Delica Lancets 40X MISC Use to check blood sugar twice a day.  DX Code: E11.9 01/12/19   Roma Schanz R, DO  oxybutynin (DITROPAN) 5 MG tablet TAKE 1 TABLET BY MOUTH 3  TIMES DAILY Patient taking differently: Take 5 mg by mouth 3 (three) times daily.  06/13/19   Ann Held, DO    Allergies    Olmesartan medoxomil, Adhesive [tape], Tetracycline hcl, and Venlafaxine  Review of Systems   Review of Systems  Unable to perform ROS: Mental status change    Physical Exam Updated Vital Signs BP (!) 141/57    Pulse 93   Temp 98.7 F (37.1 C) (Oral)   Resp (!) 24   SpO2 99%   Physical Exam Vitals and nursing note reviewed. Exam conducted with a chaperone present Maggie Schwalbe).  Constitutional:      General: She is not in acute distress.    Comments: Patient is 64 year old female appears diffusely jaundiced.  Is laying in bed with slightly increased respiratory rate of 24.  HENT:     Head: Normocephalic and atraumatic.     Nose: Nose normal.     Mouth/Throat:     Mouth: Mucous membranes are dry.  Eyes:     General: No scleral icterus. Cardiovascular:     Rate and Rhythm: Normal rate and regular rhythm.     Pulses: Normal pulses.     Heart sounds: Normal heart sounds.  Pulmonary:     Effort: Pulmonary effort is normal. No respiratory distress.     Breath sounds: No wheezing.     Comments: Lungs are clear in all fields. No crackles. Respiratory rate is 24. No significant increased work of breathing. Abdominal:     Palpations: Abdomen is soft.     Tenderness: There is no abdominal tenderness. There is no right CVA tenderness, left CVA tenderness, guarding or rebound.  Genitourinary:    Rectum: Normal.     Comments: Stool is dark brown without hematochezia Musculoskeletal:     Cervical back: Normal range of motion.     Right lower leg: No edema.     Left lower leg: No edema.     Comments: No tenderness to palpation of spine, chest, head  Skin:    General: Skin is warm and dry.     Capillary Refill: Capillary refill takes less than 2 seconds.     Coloration: Skin is jaundiced.  Neurological:     Mental Status: She is alert. Mental status is at baseline.     Comments: Oriented to self and immediate events. Unable to state date, year, president.  Speech is fluent and clear with no slurring however very slow.  Strength 4/5 in upper/lower extremities  Sensation intact in upper/lower extremities   Normal finger-to-nose and feet tapping. VERY POOR EFFORT/APPEARS DIFFUSELY WEAK CN I not  tested  CN II grossly intact visual fields bilaterally. Did not visualize posterior eye.   CN III, IV, VI PERRLA and EOMs intact bilaterally  CN V Intact sensation to sharp and light touch to the face  CN VII facial movements symmetric  CN VIII not tested  CN IX, X no uvula deviation, symmetric rise of soft palate  CN XI 5/5 SCM and trapezius strength bilaterally  CN XII Midline tongue protrusion, symmetric L/R movements    Psychiatric:        Mood and Affect: Mood normal.        Behavior: Behavior normal.     ED Results / Procedures / Treatments   Labs (all labs ordered are listed, but only abnormal results are displayed) Labs Reviewed  COMPREHENSIVE METABOLIC PANEL - Abnormal; Notable for the following components:      Result Value   Chloride 112 (*)    CO2 16 (*)    Calcium 7.9 (*)    Total Protein 5.8 (*)    Albumin 2.3 (*)    AST 81 (*)    Total Bilirubin 1.7 (*)    All other components within normal limits  CBC - Abnormal; Notable for the following components:   RBC 2.20 (*)    Hemoglobin 7.3 (*)    HCT 24.7 (*)    MCV 112.3 (*)    MCHC 29.6 (*)    RDW 15.8 (*)    nRBC 0.3 (*)    All other components within normal limits  URINALYSIS, COMPLETE (UACMP) WITH MICROSCOPIC - Abnormal; Notable for the following components:   Color, Urine AMBER (*)    APPearance CLOUDY (*)    Hgb urine dipstick MODERATE (*)    Ketones, ur 5 (*)    Protein, ur 30 (*)    Leukocytes,Ua MODERATE (*)    WBC, UA >50 (*)    Bacteria, UA MANY (*)    All other components within normal limits  APTT - Abnormal; Notable for the following components:   aPTT 40 (*)    All other components within normal limits  LACTIC ACID, PLASMA - Abnormal; Notable for the following components:   Lactic Acid, Venous 3.1 (*)    All other components within normal limits  LACTIC ACID, PLASMA - Abnormal; Notable for the following components:   Lactic Acid, Venous 3.5 (*)    All other components within normal  limits  PLATELET COUNT - Abnormal; Notable for the following components:   Platelets 83 (*)    All other components within  normal limits  PROTIME-INR - Abnormal; Notable for the following components:   Prothrombin Time 21.5 (*)    INR 1.9 (*)    All other components within normal limits  AMMONIA - Abnormal; Notable for the following components:   Ammonia 119 (*)    All other components within normal limits  CBC WITH DIFFERENTIAL/PLATELET - Abnormal; Notable for the following components:   RBC 2.16 (*)    Hemoglobin 7.2 (*)    HCT 24.0 (*)    MCV 111.1 (*)    nRBC 0.3 (*)    Lymphs Abs 0.3 (*)    Monocytes Absolute 0.0 (*)    Abs Immature Granulocytes 0.10 (*)    All other components within normal limits  BRAIN NATRIURETIC PEPTIDE - Abnormal; Notable for the following components:   B Natriuretic Peptide 854.0 (*)    All other components within normal limits  CBG MONITORING, ED - Abnormal; Notable for the following components:   Glucose-Capillary 104 (*)    All other components within normal limits  I-STAT VENOUS BLOOD GAS, ED - Abnormal; Notable for the following components:   pH, Ven 7.580 (*)    pCO2, Ven 19.0 (*)    pO2, Ven 48.0 (*)    Bicarbonate 17.8 (*)    TCO2 18 (*)    Acid-base deficit 4.0 (*)    Sodium 146 (*)    Potassium 3.4 (*)    Calcium, Ion 1.06 (*)    HCT 19.0 (*)    Hemoglobin 6.5 (*)    All other components within normal limits  URINE CULTURE  SARS CORONAVIRUS 2 BY RT PCR (HOSPITAL ORDER, [facility])  HEPATITIS PANEL, ACUTE  TSH  BLOOD GAS, VENOUS  CBG MONITORING, ED    EKG EKG Interpretation  Date/Time:  Sunday November 13 2019 12:45:11 EDT Ventricular Rate:  85 PR Interval:  160 QRS Duration: 84 QT Interval:  420 QTC Calculation: 499 R Axis:   99 Text Interpretation: Normal sinus rhythm Rightward axis ST & T wave abnormality, consider inferior ischemia Abnormal ECG similar to 2016 Confirmed by Sherwood Gambler  [phone]) on 11/13/2019 1:09:26 PM   Radiology DG Chest 2 View  Result Date: 11/13/2019 CLINICAL DATA:  Shortness of breath and altered mental status. EXAM: CHEST - 2 VIEW COMPARISON:  PET-CT dated October 27, 2019. Chest x-ray dated October 17, 2016. FINDINGS: Unchanged left chest wall port catheter. Stable cardiomegaly. Atherosclerotic calcification of the aortic arch. Mild pulmonary vascular congestion interstitial thickening. Decreased now small right pleural effusion. No left pleural effusion. No consolidation or pneumothorax. Unchanged right apical pleuroparenchymal scarring. No acute osseous abnormality. IMPRESSION: 1. Decreased now small right pleural effusion. 2. Mild interstitial pulmonary edema. Electronically Signed   By: Titus Dubin M.D.   On: 11/13/2019 14:54   CT HEAD WO CONTRAST  Result Date: 11/13/2019 CLINICAL DATA:  Encephalopathy. Altered mental status since last night. EXAM: CT HEAD WITHOUT CONTRAST TECHNIQUE: Contiguous axial images were obtained from the base of the skull through the vertex without intravenous contrast. COMPARISON:  07/09/2017 FINDINGS: Brain: Ventricles and cisterns are normal. Mild atrophy unchanged. Mild chronic ischemic microvascular disease is present. There is no mass, mass effect, shift of midline structures or acute hemorrhage. No evidence of acute infarction. Vascular: No hyperdense vessel or unexpected calcification. Skull: Normal. Negative for fracture or focal lesion. Sinuses/Orbits: Orbits are normal. Near complete opacification over the left frontal, ethmoid and maxillary sinuses compatible with chronic inflammatory change. Other: None IMPRESSION: 1.  No acute findings. 2.  Atrophy and chronic ischemic microvascular disease. 3. Complete opacification of the left frontal, ethmoid and maxillary sinuses compatible with moderate chronic inflammatory disease. Electronically Signed   By: Marin Olp M.D.   On: 11/13/2019 15:48     Procedures .Critical  Care Performed by: Tedd Sias, PA Authorized by: Tedd Sias, PA   Critical care provider statement:    Critical care time (minutes):  35   Critical care time was exclusive of:  Separately billable procedures and treating other patients and teaching time   Critical care was necessary to treat or prevent imminent or life-threatening deterioration of the following conditions:  Hepatic failure and metabolic crisis (infection)   Critical care was time spent personally by me on the following activities:  Discussions with consultants, evaluation of patient's response to treatment, examination of patient, review of old charts, re-evaluation of patient's condition, pulse oximetry, ordering and review of radiographic studies, ordering and review of laboratory studies and ordering and performing treatments and interventions   I assumed direction of critical care for this patient from another provider in my specialty: no     (including critical care time)    Medications Ordered in ED Medications  sodium chloride flush (NS) 0.9 % injection 3 mL (3 mLs Intravenous Not Given 11/13/19 1319)  lactated ringers infusion ( Intravenous Not Given 11/13/19 1511)  cefTRIAXone (ROCEPHIN) 2 g in sodium chloride 0.9 % 100 mL IVPB (0 g Intravenous Stopped 11/13/19 1601)  lactulose (CHRONULAC) 10 GM/15ML solution 30 g (10 g Oral Given 11/13/19 1609)    ED Course  I have reviewed the triage vital signs and the nursing notes.  Pertinent labs & imaging results that were available during my care of the patient were reviewed by me and considered in my medical decision making (see chart for details).   Patient is a 64 year old female with past medical history detailed above presented today for altered mental status.  Her symptoms began gradually at approximately noon yesterday.  Her husband at bedside is able to give history.  She has cirrhosis and is followed by gastroenterology.  She has not been on lactulose or other  medication to lower her ammonia has not had any episodes of altered mental status in the past.  Physical exam is overall relatively reassuring although she does appear mildly jaundiced this is potentially chronic for her.  She also appears chronically ill is a very mildly increased respiratory rate but no tachycardia. Given her altered mental status will consider a broad differential.  The differential diagnosis for AMS is extensive and includes, but is not limited to: drug overdose - opioids, alcohol, sedatives, antipsychotics, drug withdrawal, others; Metabolic: hypoxia, hypoglycemia, hyperglycemia, hypercalcemia, hypernatremia, hyponatremia, uremia, hepatic encephalopathy, hypothyroidism, hyperthyroidism, vitamin B12 or thiamine deficiency, carbon monoxide poisoning, Wilson's disease, Lactic acidosis, DKA/HHOS; Infectious: meningitis, encephalitis, bacteremia/sepsis, urinary tract infection, pneumonia, neurosyphilis; Structural: Space-occupying lesion, (brain tumor, subdural hematoma, hydrocephalus,); Vascular: stroke, subarachnoid hemorrhage, coronary ischemia, hypertensive encephalopathy, CNS vasculitis, thrombotic thrombocytopenic purpura, disseminated intravascular coagulation, hyperviscosity; Psychiatric: Schizophrenia, depression; Other: Seizure, hypothermia, heat stroke, dementia  Given her symptoms have high suspicion for UTI, hepatic encephalopathy, brain metastasis from breast/ovarian cancer, hypoxia.  She does have normal SPO2 with mildly increased work of breathing will obtain VBG to check oxygen.  ----  Patient has CBC without leukocytosis.  Hemoglobin is 7.2 which is approximately at her baseline.  She has chronically low platelets which appear unchanged today.   CMP low CO2 likely secondary  to patient's respiratory rate.  There is no anion gap and blood sugars within normal limits.  Doubt lactic acidosis however this is pending at this time.  She may be somewhat dehydrated as her  oral mucosa is dry.  Her albumin is very low and AST mildly elevated.  Will add on hepatitis panel.  Coags are elevated likely secondary to liver cirrhosis.  Platelet count is low this is consistent with cirrhosis as well.  Ammonia is elevated at 120.  I-STAT VBG shows respiratory alkalosis with some mild metabolic compensation.  Patient does have elevated lactic acid but is not acidotic.  Low suspicion for overcorrection with respiratory rate.  Suspect that she does have some respiratory alkalosis due to her pleural effusion which appears chronic and some pulmonary edema.  Her BNP is somewhat elevated suspect that she does have some degree of heart failure however she does not appear acutely fluid overloaded and there are no crackles on her exam.  Urinalysis does appear acutely infected.  She does have some hemoglobin with leukocytes and many bacteria.  This is a catheter sample very low suspicion for contamination.  Will treat with 2 g of Rocephin IV. We will treat the elevated ammonia with lactulose.  Discussed with Corene Cornea the pharmacy who recommends 30 g treatment dose.  Clinical Course as of Nov 12 1698  Sun Nov 13, 2019  1518 Patient reevaluated this time.  She does have elevated lactic acid but no leukocytosis.  Her respiratory rate is somewhat elevated but heart rate is within normal limits and no leukocytosis.  Patient does not meet SIRS criteria.   [WF]  1619 Discussed with hospitalist.  She will be admitted to the hospital for further evaluation and treatment.  I discussed with her husband who is agreeable to plan.   [WF]  1594 Patient has received 750 mL of fluid. She does not appear clinically overloaded I discussed fluid ministration of my attending Dr. Regenia Skeeter. We will hold off on additional fluids at this time lactate is mildly elevated.   [WF]    Clinical Course User Index [WF] Tedd Sias, [place]   I discussed this case with my attending physician who cosigned this note  including patient's presenting symptoms, physical exam, and planned diagnostics and interventions. Attending physician stated agreement with plan or made changes to plan which were implemented.   Attending physician assessed patient at bedside.  Discussed with Dr. Olevia Bowens who will admit patient to the hospital.  She appears stable at this time.  I communicated the results of the work-up with her husband and herself.  I also obtained Hemoccult as hospitalist elicited from his history that patient has had some dark stools.  Hemoccult pending at this time.  Patient admitted.  MDM Rules/Calculators/A&P                          Final Clinical Impression(s) / ED Diagnoses Final diagnoses:  Altered mental status, unspecified altered mental status type  Acute cystitis with hematuria  Increased ammonia level    Rx / DC Orders ED Discharge Orders    None       Tedd Sias, [place] 11/13/19 1733    Sherwood Gambler, MD 11/13/19 2225

## 2019-11-13 NOTE — ED Triage Notes (Addendum)
Husband reports confusion since last night.  Pt denies pain.  Slow to answer questions and follow commands.  Bilateral arm drift.  He is concerned she may have a UTI.

## 2019-11-13 NOTE — H&P (Addendum)
History and Physical  [name] PVV:[id number] DOB: February 05, 1956 DOA: 11/13/2019  PCP: Ann Held, DO Patient coming from: Home  I have personally briefly reviewed patient's old medical records in [place]   Chief Complaint: Confusion  HPI: [name] is a 64 y.o. female past medical history of fatty liver cirrhosis, breast cancer and ovarian cancer last chemo and radiation last treatment a few month ago with esophageal varices who was brought in by her husband for confusion, he relates that the day prior to admission she seemed a little bit slow to respond she went to bed and when she woke up this morning she was completely odd slow to answer and to respond and hallucinating, she cannot ambulate she denied any pain, chest pain or shortness of breath.  As per husband none of her medications has been changed, she has no new medications.  He relates she has not been complaining of dysuria cough shortness of breath or chest pain.  Her husband relates that she has had 2 bowel movements that are black and sticky to the wall of the toilet.  ED: She was found to have an ammonia of 120, sodium of 145 potassium 3.4 lactic acid of 3.5, cytosis no left shift on CBC, hemoglobin of 6.5 with a platelet count of 83 SARS-CoV-2 is negative so we are consulted for further evaluation.   Review of Systems: All systems reviewed and apart from history of presenting illness, are negative.  Past Medical History:  Diagnosis Date  . Allergy   . Anemia    low iron hx  . Arthritis   . Breast cancer ([place]) 2014   a. 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 Cataract    removed ou  . Chronic combined systolic and diastolic CHF (congestive heart failure) ([place])    a. 08/2012 Echo: EF 55-60%, no rwma, mod MR;  b. 07/2014 Echo: EF 45-50%, distal antsept HK, mod TR/MR, mildly bil-atrial  enlargement.  . Clear Cell Ovarian Cancer    a. 06/2014 s/p TAH/BSO debulking/lymph node dissection;  b. Now on chemo.  . Depression   . Diabetes mellitus   . DJD (degenerative joint disease) of lumbar spine   . DVT (deep venous thrombosis) ([facility])    hx of on HRT left leg ~2006  . Gallstones   . GERD (gastroesophageal reflux disease)   . Hyperlipidemia   . Hypertension   . Hypothyroidism   . Liver disease, chronic, with cirrhosis ([place])    ? autoimmune  . OSA on CPAP    cpap 4.5 setting  . Paroxysmal atrial fibrillation ([facility])    a. CHA2DS2VASc = 4-->coumadin.  [name] Physical deconditioning   . PPD positive, treated    rx inh   . Sleep apnea    Past Surgical History:  Procedure Laterality Date  . ABDOMINAL HYSTERECTOMY N/A 06/27/2014   Procedure: HYSTERECTOMY ABDOMINAL TOTAL;  Surgeon: Everitt Amber, MD;  Location: [facility];  Service: Gynecology;  Laterality: N/A;  . BREAST BIOPSY     left breast  . BREAST EXCISIONAL BIOPSY Left 2002  . CATARACT EXTRACTION    . CHOLECYSTECTOMY    . EYE SURGERY    . FOOT SURGERY    . history of chemotherapy x 2 treatments, radiation tx  2014  . LAPAROTOMY N/A 06/27/2014   Procedure: EXPLORATORY LAPAROTOMY;  Surgeon: Everitt Amber, MD;  Location: [facility];  Service: Gynecology;  Laterality: N/A;  . MASTECTOMY Right 2014  . MASTECTOMY MODIFIED RADICAL  06/08/2012   Procedure: MASTECTOMY MODIFIED RADICAL;  Surgeon: Edward Jolly, MD;  Location: [facility];  Service: General;  Laterality: Right;  . pac removed  2015  . PEG TUBE PLACEMENT     peg removed 2015  . PERCUTANEOUS LIVER BIOPSY    . PORTACATH PLACEMENT  06/08/2012   Procedure: INSERTION PORT-A-CATH;  Surgeon: Edward Jolly, MD;  Location: [facility];  Service: General;  Laterality: Left;  . SALPINGOOPHORECTOMY Bilateral 06/27/2014   Procedure: BILATERAL SALPINGO OOPHORECTOMY/TUMOR DEBULKING/LYMPHNODE DISSECTION, OMENTECTOMY;  Surgeon: Everitt Amber, MD;  Location: [facility];  Service: Gynecology;   Laterality: Bilateral;  . TUBAL LIGATION     Social History:  reports that she has quit smoking. She has a 1.00 pack-year smoking history. She has never used smokeless tobacco. She reports that she does not drink alcohol and does not use drugs.   Allergies  Allergen Reactions  . Olmesartan Medoxomil Cough  . Adhesive [Tape] Rash  . Tetracycline Hcl Other (See Comments)  . Venlafaxine Other (See Comments)    severe dry mouth    Family History  Problem Relation Age of Onset  . Diabetes Mother   . Hypertension Mother   . Arthritis Mother   . Heart disease Mother   . Heart failure Mother   . Other Mother        benign breast mass  . Stroke Father   . Heart disease Father   . Dementia Father   . Diabetes Paternal Grandmother   . Colon cancer Paternal Grandfather        dx. 70s-80s  . Colon cancer Maternal Grandfather   . Other Daughter        potentially has had negative BRCA testing  . Other Daughter        hysterectomy; potentially has had negative genetic testing  . Rectal cancer Neg Hx   . Esophageal cancer Neg Hx   . Liver cancer Neg Hx     Prior to Admission medications   Medication Sig Start Date End Date Taking? Authorizing Provider  acetaminophen (TYLENOL) 500 MG tablet Take 500 mg by mouth every 6 (six) hours as needed for moderate pain.   Yes [provider]  ALPRAZolam (XANAX) 0.5 MG tablet TAKE 1 TABLET BY MOUTH  TWICE DAILY AS NEEDED FOR  ANXIETY Patient taking differently: Take 0.5 mg by mouth 2 (two) times daily as needed for anxiety.  10/11/19  Yes Magrinat, Virgie Dad, MD  buPROPion (WELLBUTRIN XL) 300 MG 24 hr tablet Take 1 tablet (300 mg total) by mouth daily. 08/05/19  Yes Magrinat, Virgie Dad, MD  escitalopram (LEXAPRO) 20 MG tablet TAKE 1 TABLET BY MOUTH IN  THE MORNING Patient taking differently: Take 20 mg by mouth daily.  03/21/19  Yes Roma Schanz R, DO  furosemide (LASIX) 20 MG tablet TAKE 1 TABLET BY MOUTH  DAILY Patient taking  differently: Take 20 mg by mouth daily.  02/25/19  Yes Magrinat, Virgie Dad, MD  levothyroxine (SYNTHROID) 150 MCG tablet TAKE 1 TABLET BY MOUTH  DAILY Patient taking differently: Take 150 mcg by mouth daily before breakfast.  01/12/19  Yes Carollee Herter, Alferd Apa, DO  metFORMIN (GLUCOPHAGE-XR) 500 MG 24 hr tablet TAKE 2 TABLETS BY MOUTH  EVERY EVENING Patient taking differently: Take 500 mg by mouth in the morning and at bedtime.  01/12/19  Yes Carollee Herter,  Yvonne R, DO  OLANZapine (ZYPREXA) 2.5 MG tablet TAKE 1 TABLET BY MOUTH IN  THE EVENING AT BEDTIME Patient taking differently: Take 2.5 mg by mouth at bedtime.  03/21/19  Yes Roma Schanz R, DO  potassium chloride (K-DUR) 10 MEQ tablet TAKE 1 TABLET BY MOUTH  TWICE DAILY Patient taking differently: Take 10 mEq by mouth 2 (two) times daily.  01/24/19  Yes Roma Schanz R, DO  pravastatin (PRAVACHOL) 10 MG tablet TAKE 1 TABLET BY MOUTH  DAILY Patient taking differently: Take 10 mg by mouth daily.  03/21/19  Yes Roma Schanz R, DO  propranolol (INDERAL) 20 MG tablet TAKE 1 TABLET BY MOUTH  TWICE DAILY Patient taking differently: Take 20 mg by mouth 2 (two) times daily.  09/15/19  Yes Armbruster, Carlota Raspberry, MD  Toremifene Citrate (FARESTON) 60 MG tablet Take 1 tablet (60 mg total) by mouth daily. Start 08/05/2019 08/05/19  Yes Magrinat, Virgie Dad, MD  Blood Glucose Monitoring Suppl (ONETOUCH VERIO) w/Device KIT Use as directed twice a day.  Dx code: E11.9 01/12/19   Carollee Herter, Alferd Apa, DO  glucose blood (ONETOUCH VERIO) test strip USE TO CHECK BLOOD SUGAR  TWO TIMES DAILY. Dx Code: E11.9 01/12/19   Carollee Herter, Alferd Apa, DO  lidocaine-prilocaine (EMLA) cream APPLY TOPICALLY OVER PORT  AREA 1-2 HOURS BEFORE  CHEMOTHERAPY AS NEEDED Patient taking differently: Apply 1 application topically daily as needed (before chemotherapy).  02/15/18   Magrinat, Virgie Dad, MD  omeprazole (PRILOSEC) 20 MG capsule Take 1 capsule (20 mg total) by mouth daily.  02/02/18   Carollee Herter, Yvonne R, DO  ondansetron (ZOFRAN) 8 MG tablet TAKE 1 TABLET BY MOUTH  EVERY 8 HOURS AS NEEDED FOR NAUSEA OR VOMITING Patient taking differently: Take 8 mg by mouth every 8 (eight) hours as needed for nausea or vomiting.  04/11/19   Magrinat, Virgie Dad, MD  OneTouch Delica Lancets 43P MISC Use to check blood sugar twice a day.  DX Code: E11.9 01/12/19   Roma Schanz R, DO  oxybutynin (DITROPAN) 5 MG tablet TAKE 1 TABLET BY MOUTH 3  TIMES DAILY Patient taking differently: Take 5 mg by mouth 3 (three) times daily.  06/13/19   Ann Held, DO   Physical Exam: Vitals:   11/13/19 1237 11/13/19 1432 11/13/19 1513 11/13/19 1624  BP: (!) 146/71 (!) 141/57    Pulse: 87 91 92 93  Resp: _0 (!) 24  Temp: 98.7 F (37.1 C)     TempSrc: Oral     SpO2: 99%        General exam: Moderately built and nourished patient, lying in bed in no acute distress  Head, eyes and ENT: Membranes are moist the pupils are equally round reacting to light symmetrically  Neck: Supple. No JVD, carotid bruit or thyromegaly.  Lymphatics: No lymphadenopathy.  Respiratory system: Good air movement and clear to auscultation Port-A-Cath in place  Cardiovascular system: S1 and S2 heard, RRR. No JVD, murmurs, gallops, clicks or pedal edema.  Gastrointestinal system: Positive bowel sounds soft with mild tenderness no rebound or guarding and suprapubic tenderness  Central nervous system: No asterixis able to move all 4 extremities without any difficulties but not on command  Extremities: Symmetric 5 x 5 power. Peripheral pulses symmetrically felt.   Skin: Multiple telangiectasia  Musculoskeletal system: Negative exam.  Psychiatry: Pleasant and cooperative.   Labs on Admission:  Basic Metabolic Panel: Recent Labs  Lab 11/13/19 1248  11/13/19 1427  NA 142 146*  K 4.0 3.4*  CL 112*  --   CO2 16*  --   GLUCOSE 98  --   BUN 12  --   CREATININE 0.65  --   CALCIUM 7.9*  --     Liver Function Tests: Recent Labs  Lab 11/13/19 1248  AST 81*  ALT 26  ALKPHOS 76  BILITOT 1.7*  PROT 5.8*  ALBUMIN 2.3*   No results for input(s): LIPASE, AMYLASE in the last 168 hours. Recent Labs  Lab 11/13/19 1422  AMMONIA 119*   CBC: Recent Labs  Lab 11/13/19 1248 11/13/19 1422 11/13/19 1427  WBC 6.6  6.6  --   --   NEUTROABS 6.2  --   --   HGB 7.2*  7.3*  --  6.5*  HCT 24.0*  24.7*  --  19.0*  MCV 111.1*  112.3*  --   --   PLT ACLMP  ACLMP 83*  --    Cardiac Enzymes: No results for input(s): CKTOTAL, CKMB, CKMBINDEX, TROPONINI in the last 168 hours.  BNP (last 3 results) No results for input(s): PROBNP in the last 8760 hours. CBG: Recent Labs  Lab 11/13/19 1239 11/13/19 1419  GLUCAP 87 104*    Radiological Exams on Admission: DG Chest 2 View  Result Date: 11/13/2019 CLINICAL DATA:  Shortness of breath and altered mental status. EXAM: CHEST - 2 VIEW COMPARISON:  PET-CT dated October 27, 2019. Chest x-ray dated October 17, 2016. FINDINGS: Unchanged left chest wall port catheter. Stable cardiomegaly. Atherosclerotic calcification of the aortic arch. Mild pulmonary vascular congestion interstitial thickening. Decreased now small right pleural effusion. No left pleural effusion. No consolidation or pneumothorax. Unchanged right apical pleuroparenchymal scarring. No acute osseous abnormality. IMPRESSION: 1. Decreased now small right pleural effusion. 2. Mild interstitial pulmonary edema. Electronically Signed   By: Titus Dubin M.D.   On: 11/13/2019 14:54   CT HEAD WO CONTRAST  Result Date: 11/13/2019 CLINICAL DATA:  Encephalopathy. Altered mental status since last night. EXAM: CT HEAD WITHOUT CONTRAST TECHNIQUE: Contiguous axial images were obtained from the base of the skull through the vertex without intravenous contrast. COMPARISON:  07/09/2017 FINDINGS: Brain: Ventricles and cisterns are normal. Mild atrophy unchanged. Mild chronic ischemic microvascular  disease is present. There is no mass, mass effect, shift of midline structures or acute hemorrhage. No evidence of acute infarction. Vascular: No hyperdense vessel or unexpected calcification. Skull: Normal. Negative for fracture or focal lesion. Sinuses/Orbits: Orbits are normal. Near complete opacification over the left frontal, ethmoid and maxillary sinuses compatible with chronic inflammatory change. Other: None IMPRESSION: 1.  No acute findings. 2.  Atrophy and chronic ischemic microvascular disease. 3. Complete opacification of the left frontal, ethmoid and maxillary sinuses compatible with moderate chronic inflammatory disease. Electronically Signed   By: Marin Olp M.D.   On: 11/13/2019 15:48    EKG: Independently reviewed.  Sinus rhythm right axis deviation normal intervals nonspecific T wave changes.  Assessment/Plan Acute metabolic encephalopathy: Of unclear etiology she does not have asterixis on physical exam, but her ammonia level is 120, she is tender in her abdomen and she does have a history of cirrhosis, will get an abdominal ultrasound and if there is fluid will go ahead and send for cell count and culture.  There has been no change or new medications.  Also her UA appears to show signs of infection. We will go ahead and start her on lactulose rectally as she cannot tolerated orally,  agree with IV Rocephin to cover her urinary tract infection and also the possibility of spontaneous bacterial peritonitis, any of these 2 infections can probably be contributing to her metabolic encephalopathy. I will go ahead and order an ultrasound and if there is fluid will send for paracentesis with cell count Gram stain and culture in the meantime we will start her empirically on IV Rocephin. It is unlikely to be medication induced. I will hold all her psychotropic medication including benzodiazepines.  Normocytic anemia: Her hemoglobin is 6.5 we will go ahead and transfuse 1 unit of packed red  blood cells, as per husband she has not had melanotic stools or hematochezia. We will now place her on Lovenox therapeutically or any antiplatelet therapy. We will consult GI as she may need an endoscopy as she has a history of esophageal varices  Lactic acidosis: Likely due to hypovolemia she is not septic we will start on gentle IV fluid hydration monitor strict I's and O's.  Cirrhosis of liver ([place]) See above we will start her on lactulose, her ammonia level was 120, she has no asterixis on physical exam  Chronic combined systolic and diastolic CHF (congestive heart failure) ([facility]) Appears on the dry side hold diuretic therapy.  Essential hypertension Blood pressure seems to be well controlled, will hold all antihypertensive medication.  DM (diabetes mellitus) type II uncontrolled, periph vascular disorder ([facility]) Hold oral hypoglycemic agents will start her on sliding scale insulin with CBGs every 4.    DVT Prophylaxis: Lovenox Code Status: Full  Family Communication: Husband  Disposition Plan: inpatient    It is my clinical opinion that admission to INPATIENT is reasonable and necessary in this 64 y.o. female past medical history of fatty liver with cirrhosis, multiple psychotropic medications, comes in with acute encephalopathy, question infection versus hepatic, she will be aggressively treated and transfuse 1 unit of packed red blood cells treat empirically with IV Rocephin for possible UTI as should cover SBP also get an abdominal ultrasound and send for cell count.   Given the aforementioned, the predictability of an adverse outcome is felt to be significant. I expect that the patient will require at least 2 midnights in the hospital to treat this condition.  Charlynne Cousins MD [facility]   11/13/2019, 4:30 PM

## 2019-11-13 NOTE — Progress Notes (Signed)
Messaged pharmacy x2 about sending lactulose enema  due at 1900. Reported, "currently a shorage". Will check back with pharmacy again.

## 2019-11-14 ENCOUNTER — Other Ambulatory Visit (HOSPITAL_COMMUNITY): Payer: Medicare Other

## 2019-11-14 DIAGNOSIS — K921 Melena: Secondary | ICD-10-CM

## 2019-11-14 LAB — CBC
HCT: 23.8 % — ABNORMAL LOW (ref 36.0–46.0)
Hemoglobin: 7.2 g/dL — ABNORMAL LOW (ref 12.0–15.0)
MCH: 32.7 pg (ref 26.0–34.0)
MCHC: 30.3 g/dL (ref 30.0–36.0)
MCV: 108.2 fL — ABNORMAL HIGH (ref 80.0–100.0)
Platelets: 60 10*3/uL — ABNORMAL LOW (ref 150–400)
RBC: 2.2 MIL/uL — ABNORMAL LOW (ref 3.87–5.11)
RDW: 17.2 % — ABNORMAL HIGH (ref 11.5–15.5)
WBC: 4.5 10*3/uL (ref 4.0–10.5)
nRBC: 0 % (ref 0.0–0.2)

## 2019-11-14 LAB — LACTIC ACID, PLASMA: Lactic Acid, Venous: 1.7 mmol/L (ref 0.5–1.9)

## 2019-11-14 LAB — COMPREHENSIVE METABOLIC PANEL
ALT: 24 U/L (ref 0–44)
AST: 43 U/L — ABNORMAL HIGH (ref 15–41)
Albumin: 2 g/dL — ABNORMAL LOW (ref 3.5–5.0)
Alkaline Phosphatase: 66 U/L (ref 38–126)
Anion gap: 9 (ref 5–15)
BUN: 11 mg/dL (ref 8–23)
CO2: 18 mmol/L — ABNORMAL LOW (ref 22–32)
Calcium: 7.5 mg/dL — ABNORMAL LOW (ref 8.9–10.3)
Chloride: 118 mmol/L — ABNORMAL HIGH (ref 98–111)
Creatinine, Ser: 0.67 mg/dL (ref 0.44–1.00)
GFR calc Af Amer: >60 mL/min (ref >60)
GFR calc non Af Amer: >60 mL/min (ref >60)
Glucose, Bld: 118 mg/dL — ABNORMAL HIGH (ref 70–99)
Potassium: 2.9 mmol/L — ABNORMAL LOW (ref 3.5–5.1)
Sodium: 145 mmol/L (ref 135–145)
Total Bilirubin: 1.7 mg/dL — ABNORMAL HIGH (ref 0.3–1.2)
Total Protein: 5.1 g/dL — ABNORMAL LOW (ref 6.5–8.1)

## 2019-11-14 LAB — ABO/RH: ABO/RH(D): O POS

## 2019-11-14 LAB — AMMONIA: Ammonia: 61 umol/L — ABNORMAL HIGH (ref 9–35)

## 2019-11-14 MED ORDER — CHLORHEXIDINE GLUCONATE CLOTH 2 % EX PADS
6.0000 | MEDICATED_PAD | Freq: Every day | CUTANEOUS | Status: DC
  Administered 2019-11-14: 6 via TOPICAL

## 2019-11-14 MED ORDER — HALOPERIDOL LACTATE 5 MG/ML IJ SOLN
2.0000 mg | Freq: Once | INTRAMUSCULAR | Status: AC
  Administered 2019-11-14: 2 mg via INTRAVENOUS

## 2019-11-14 MED ORDER — HALOPERIDOL LACTATE 5 MG/ML IJ SOLN: 1.0000 mg | Freq: Four times a day (QID) | INTRAMUSCULAR | Status: DC | PRN

## 2019-11-14 MED ORDER — SODIUM CHLORIDE 0.9 % IV BOLUS
1000.0000 mL | Freq: Once | INTRAVENOUS | Status: AC
  Administered 2019-11-14: 1000 mL via INTRAVENOUS

## 2019-11-14 MED ORDER — POTASSIUM CHLORIDE CRYS ER 20 MEQ PO TBCR: 40.0000 meq | EXTENDED_RELEASE_TABLET | Freq: Two times a day (BID) | ORAL | Status: DC

## 2019-11-14 MED ORDER — POTASSIUM CHLORIDE 10 MEQ/100ML IV SOLN
10.0000 meq | INTRAVENOUS | Status: AC
  Administered 2019-11-14 (×4): 10 meq via INTRAVENOUS
  Filled 2019-11-14 (×4): qty 100

## 2019-11-14 MED ORDER — LIDOCAINE HCL (PF) 1 % IJ SOLN
INTRAMUSCULAR | Status: AC
  Filled 2019-11-14: qty 10

## 2019-11-14 MED ORDER — LACTULOSE ENEMA
300.0000 mL | Freq: Two times a day (BID) | ORAL | Status: DC
  Filled 2019-11-14 (×3): qty 300

## 2019-11-14 MED ORDER — SODIUM CHLORIDE 0.9% FLUSH: 10.0000 mL | INTRAVENOUS | Status: DC | PRN

## 2019-11-14 MED ORDER — HALOPERIDOL LACTATE 5 MG/ML IJ SOLN
2.0000 mg | Freq: Four times a day (QID) | INTRAMUSCULAR | Status: DC | PRN
  Administered 2019-11-14 – 2019-11-15 (×3): 2 mg via INTRAVENOUS
  Filled 2019-11-14 (×4): qty 1

## 2019-11-14 NOTE — Plan of Care (Signed)
  Problem: Pain Managment: Goal: General experience of comfort will improve Outcome: Progressing   Problem: Safety: Goal: Ability to remain free from injury will improve Outcome: Progressing   Problem: Skin Integrity: Goal: Risk for impaired skin integrity will decrease Outcome: Progressing   

## 2019-11-14 NOTE — Progress Notes (Signed)
The patient is confused, taking her clothes off.  The husband has been at her side throughout and is requesting that the code status be changed to DNR.  New order received from Dr Aileen Fass for [place]. DNR status in place.

## 2019-11-14 NOTE — Progress Notes (Signed)
Pt had a run of Vtach as we were inserting a rectal tube.

## 2019-11-14 NOTE — Progress Notes (Signed)
Haldol 2 mg given IV for restlessness as ordered

## 2019-11-14 NOTE — Progress Notes (Signed)
The patient's husband is requesting to have code status changed.  Dr Aileen Fass has been sent a secure message regarding.

## 2019-11-14 NOTE — Progress Notes (Addendum)
[facility] PROGRESS NOTE    Progress Note  [name]  OQH:[id number] DOB: [birth year] DOA: 11/13/2019 PCP: Ann Held, DO     Brief Narrative:   [name] is an 64 y.o. 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 Havery Moros she refused EGD and colonoscopies.  Assessment/Plan:   Acute metabolic encephalopathy: Of unclear etiology, most likely there is a component of hepatic encephalopathy, will continue lactulose enemas. Continue IV fluid hydration her urine is significantly concentrated. Abdominal ultrasound with paracentesis for cell count differential Gram stain and cultures to be done today. Continue Rocephin urine cultures pending as UA appears to be infected. CT of the head showed no acute findings. Continue to hold all psychotropic medications. Patient is still significantly sleepy able to withdraw with pain she relates she is sleepy. Keep the patient n.p.o. She did receive Ativan at 7 AM this morning which could be contributing to her sleepy and sedative state this morning. Avoid Bzd'a and narcotics as patient at high risk of deliium  Normocytic anemia/Melena: She has refused EGD and colonoscopy in the past on a recent discussion with her GI physician, her hemoglobin on admission was 6.5 (3.26.2021 10.3 g/dl ) her guaiac was positive the patient husband relates black tarry stool she received 1 unit of packed red blood cells her hemoglobin today 7.5. Continue to hold Lovenox use SCDs.  Lactic acidosis: Likely due to hypovolemia recheck lactic acid this morning.  Cirrhosis of the liver: Continue lactulose enemas twice a day recheck an ammonia level.  Chronic combined systolic and diastolic heart failure: Appears  to be dry continue to hold diuretic therapy.  Essential hypertension: Continue to hold antihypertensive medication.  Diabetes mellitus type 2 uncontrolled with peripheral vascular disease: Hold oral hypoglycemic agents continue CBGs every 4 blood glucose well controlled.  Chronic thrombocytopenia: Likely due to liver disease.  Abdominal ultrasound to evaluate for splenomegaly.  Ethics: I have explained to the family the very poor prognosis the patient had, they would like to think it over the next 24 hours they would typically like to move towards palliative care they are unsure whether to move towards comfort care, he did decide to move towards comfort care they would like to take her home. At this time will use Haldol for agitation.  DVT prophylaxis: SCD Family Communication:husband Status is: Inpatient  Remains inpatient appropriate because:Hemodynamically unstable   Dispo: The patient is from: Home              Anticipated d/c is to: Home              Anticipated d/c date is: > 3 days              Patient currently is not medically stable to d/c.        Code Status:     Code Status Orders  (From admission, onward)         Start     Ordered   11/13/19 1846  Full code  Continuous        11/13/19 1845        Code Status History    Date Active Date Inactive Code Status Order ID Comments User Context   10/26/2014 1607 10/27/2014 0319 Full Code [id number]  Markus Daft, MD [facility]   09/30/2014 1426 10/02/2014 1852 Full Code [id number]  Markus Daft, MD Inpatient   09/25/2014 2024 09/30/2014 1426 Full Code [id number]  Orson Eva, MD Inpatient   07/28/2014 2203 08/08/2014 1858 Full Code [id number]  Rise Patience, MD Inpatient   07/11/2014 1021 07/12/2014 0334 Full Code [id number]  Greggory Keen, MD HOV   06/27/2014 1943 06/30/2014 1635 Full Code [id number]  Lahoma Crocker, MD Inpatient   09/14/2012 1407 09/28/2012 1459 Full Code [id number]  Meredith Staggers, MD Inpatient   09/13/2012  1653 09/13/2012 1834 DNR [id number]  Bary Leriche, PA-C Inpatient   09/07/2012 1405 09/13/2012 1653 DNR [id number]  Oswald Hillock, MD ED   08/24/2012 1536 09/06/2012 1654 Full Code [id number]  Eugenie Filler, MD ED   07/12/2012 1504 07/21/2012 1416 Full Code [id number]  Rama, Venetia Maxon, MD Inpatient   06/08/2012 1132 06/09/2012 1616 Full Code [id number]  Jacklynn Ganong, RN Inpatient   Advance Care Planning Activity        IV Access:    Peripheral IV   Procedures and diagnostic studies:   DG Chest 2 View  Result Date: 11/13/2019 CLINICAL DATA:  Shortness of breath and altered mental status. EXAM: CHEST - 2 VIEW COMPARISON:  PET-CT dated October 27, 2019. Chest x-ray dated October 17, 2016. FINDINGS: Unchanged left chest wall port catheter. Stable cardiomegaly. Atherosclerotic calcification of the aortic arch. Mild pulmonary vascular congestion interstitial thickening. Decreased now small right pleural effusion. No left pleural effusion. No consolidation or pneumothorax. Unchanged right apical pleuroparenchymal scarring. No acute osseous abnormality. IMPRESSION: 1. Decreased now small right pleural effusion. 2. Mild interstitial pulmonary edema. Electronically Signed   By: Titus Dubin M.D.   On: 11/13/2019 14:54   CT HEAD WO CONTRAST  Result Date: 11/13/2019 CLINICAL DATA:  Encephalopathy. Altered mental status since last night. EXAM: CT HEAD WITHOUT CONTRAST TECHNIQUE: Contiguous axial images were obtained from the base of the skull through the vertex without intravenous contrast. COMPARISON:  07/09/2017 FINDINGS: Brain: Ventricles and cisterns are normal. Mild atrophy unchanged. Mild chronic ischemic microvascular disease is present. There is no mass, mass effect, shift of midline structures or acute hemorrhage. No evidence of acute infarction. Vascular: No hyperdense vessel or unexpected calcification. Skull: Normal. Negative for fracture or focal lesion. Sinuses/Orbits: Orbits are normal. Near  complete opacification over the left frontal, ethmoid and maxillary sinuses compatible with chronic inflammatory change. Other: None IMPRESSION: 1.  No acute findings. 2.  Atrophy and chronic ischemic microvascular disease. 3. Complete opacification of the left frontal, ethmoid and maxillary sinuses compatible with moderate chronic inflammatory disease. Electronically Signed   By: Marin Olp M.D.   On: 11/13/2019 15:48     Medical Consultants:    None.  Anti-Infectives:   rocephin  Subjective:    GRETEL CANTU sleepy this morning only responsive to pain.  Opens her eyes on commands.  Objective:    Vitals:   11/13/19 2210 11/13/19 2240 11/14/19 0145 11/14/19 0807  BP: 129/89 (!) 96/52 (!) 141/65 129/69  Pulse: 87 85 89 75  Resp: 17 16 16 17   Temp: 98.1 F (36.7 C) 98.3 F (36.8 C) 98.3 F (36.8 C) 98.9 F (37.2 C)  TempSrc: Oral Axillary Axillary Axillary  SpO2: 98% 97%  99%   SpO2: 99 %   Intake/Output Summary (Last 24 hours) at 11/14/2019 0845 Last data filed at 11/14/2019 0500 Gross per 24 hour  Intake 155.06 ml  Output --  Net 155.06  ml   There were no vitals filed for this visit.  Exam: General exam: In no acute distress. Respiratory system: Good air movement and clear to auscultation. Cardiovascular system: S1 & S2 heard, RRR. No JVD. Gastrointestinal system: Abdomen is nondistended, soft and nontender.  Central nervous system: Responsive to pain moving all 4 extremities without any difficulties, open her eyes to commands. Extremities: No pedal edema. Skin: No rashes, lesions or ulcers   Data Reviewed:    Labs: Basic Metabolic Panel: Recent Labs  Lab 11/13/19 1248 11/13/19 1248 11/13/19 1427 11/14/19 0403  NA 142  --  146* 145  K 4.0   < > 3.4* 2.9*  CL 112*  --   --  118*  CO2 16*  --   --  18*  GLUCOSE 98  --   --  118*  BUN 12  --   --  11  CREATININE 0.65  --   --  0.67  CALCIUM 7.9*  --   --  7.5*   < > = values in this interval not  displayed.   GFR CrCl cannot be calculated (Unknown ideal weight.). Liver Function Tests: Recent Labs  Lab 11/13/19 1248 11/14/19 0403  AST 81* 43*  ALT 26 24  ALKPHOS 76 66  BILITOT 1.7* 1.7*  PROT 5.8* 5.1*  ALBUMIN 2.3* 2.0*   No results for input(s): LIPASE, AMYLASE in the last 168 hours. Recent Labs  Lab 11/13/19 1422  AMMONIA 119*   Coagulation profile Recent Labs  Lab 11/13/19 1422  INR 1.9*   COVID-19 Labs  No results for input(s): DDIMER, FERRITIN, LDH, CRP in the last 72 hours.  Lab Results  Component Value Date   [place] NEGATIVE 11/13/2019    CBC: Recent Labs  Lab 11/13/19 1248 11/13/19 1422 11/13/19 1427 11/14/19 0403  WBC 6.6   6.6  --   --  4.5  NEUTROABS 6.2  --   --   --   HGB 7.2*   7.3*  --  6.5* 7.2*  HCT 24.0*   24.7*  --  19.0* 23.8*  MCV 111.1*   112.3*  --   --  108.2*  PLT ACLMP   ACLMP 83*  --  60*   Cardiac Enzymes: No results for input(s): CKTOTAL, CKMB, CKMBINDEX, TROPONINI in the last 168 hours. BNP (last 3 results) No results for input(s): PROBNP in the last 8760 hours. CBG: Recent Labs  Lab 11/13/19 1239 11/13/19 1419  GLUCAP 87 104*   D-Dimer: No results for input(s): DDIMER in the last 72 hours. Hgb A1c: No results for input(s): HGBA1C in the last 72 hours. Lipid Profile: No results for input(s): CHOL, HDL, LDLCALC, TRIG, CHOLHDL, LDLDIRECT in the last 72 hours. Thyroid function studies: Recent Labs    11/13/19 1422  TSH 1.686   Anemia work up: No results for input(s): VITAMINB12, FOLATE, FERRITIN, TIBC, IRON, RETICCTPCT in the last 72 hours. Sepsis Labs: Recent Labs  Lab 11/13/19 1248 11/13/19 1422 11/13/19 1528 11/14/19 0403  WBC 6.6   6.6  --   --  4.5  LATICACIDVEN  --  3.1* 3.5*  --    Microbiology Recent Results (from the past 240 hour(s))  SARS Coronavirus 2 by RT PCR (hospital order, performed in [facility] hospital lab) Nasopharyngeal Nasopharyngeal Swab     Status: None    Collection Time: 11/13/19  4:04 PM   Specimen: Nasopharyngeal Swab  Result Value Ref Range Status   SARS Coronavirus 2 NEGATIVE NEGATIVE  Final    Comment: (NOTE) SARS-CoV-2 target nucleic acids are NOT DETECTED.  The SARS-CoV-2 RNA is generally detectable in upper and lower respiratory specimens during the acute phase of infection. The lowest concentration of SARS-CoV-2 viral copies this assay can detect is 250 copies / mL. A negative result does not preclude SARS-CoV-2 infection and should not be used as the sole basis for treatment or other patient management decisions.  A negative result may occur with improper specimen collection / handling, submission of specimen other than nasopharyngeal swab, presence of viral mutation(s) within the areas targeted by this assay, and inadequate number of viral copies (<250 copies / mL). A negative result must be combined with clinical observations, patient history, and epidemiological information.  Fact Sheet for Patients:   StrictlyIdeas.no  Fact Sheet for Healthcare Providers: BankingDealers.co.za  This test is not yet approved or  cleared by the [place] FDA and has been authorized for detection and/or diagnosis of SARS-CoV-2 by FDA under an Emergency Use Authorization (EUA).  This EUA will remain in effect (meaning this test can be used) for the duration of the COVID-19 declaration under Section 564(b)(1) of the Act, 21 U.S.C. section 360bbb-3(b)(1), unless the authorization is terminated or revoked sooner.  Performed at [facility], [place] [address], [place], [place] [address]      Medications:    Chlorhexidine Gluconate Cloth  6 each Topical Daily   lactulose  300 mL Rectal BID   levothyroxine  150 mcg Oral Q0600   propranolol  20 mg Oral BID   sodium chloride flush  3 mL Intravenous Once   Continuous Infusions:  cefTRIAXone (ROCEPHIN)  IV     dextrose 5 % and  0.45% NaCl 50 mL/hr at 11/14/19 0154      LOS: 1 day   Charlynne Cousins  [facility]  11/14/2019, 8:45 AM

## 2019-11-14 NOTE — Progress Notes (Signed)
Spoke with [name], Pharmacist in regards to hospital policy giving IV Haldol.  He reports that it can be given IV

## 2019-11-14 NOTE — Progress Notes (Signed)
Oxygen placed per N/C at 2 liters for comfort.  Oxygen saturation is 94% but decreases slightly around 88-91%

## 2019-11-15 DIAGNOSIS — Z515 Encounter for palliative care: Secondary | ICD-10-CM

## 2019-11-15 DIAGNOSIS — N3001 Acute cystitis with hematuria: Secondary | ICD-10-CM

## 2019-11-15 DIAGNOSIS — R4182 Altered mental status, unspecified: Secondary | ICD-10-CM

## 2019-11-15 DIAGNOSIS — G9340 Encephalopathy, unspecified: Secondary | ICD-10-CM

## 2019-11-15 DIAGNOSIS — Z66 Do not resuscitate: Secondary | ICD-10-CM

## 2019-11-15 DIAGNOSIS — I482 Chronic atrial fibrillation, unspecified: Secondary | ICD-10-CM

## 2019-11-15 DIAGNOSIS — Z7189 Other specified counseling: Secondary | ICD-10-CM

## 2019-11-15 LAB — URINE CULTURE: Culture: >=100000 — AB

## 2019-11-15 LAB — COMPREHENSIVE METABOLIC PANEL
ALT: 24 U/L (ref 0–44)
AST: 46 U/L — ABNORMAL HIGH (ref 15–41)
Albumin: 1.9 g/dL — ABNORMAL LOW (ref 3.5–5.0)
Alkaline Phosphatase: 61 U/L (ref 38–126)
Anion gap: 10 (ref 5–15)
BUN: 9 mg/dL (ref 8–23)
CO2: 17 mmol/L — ABNORMAL LOW (ref 22–32)
Calcium: 7.2 mg/dL — ABNORMAL LOW (ref 8.9–10.3)
Chloride: 117 mmol/L — ABNORMAL HIGH (ref 98–111)
Creatinine, Ser: 0.58 mg/dL (ref 0.44–1.00)
GFR calc Af Amer: >60 mL/min (ref >60)
GFR calc non Af Amer: >60 mL/min (ref >60)
Glucose, Bld: 117 mg/dL — ABNORMAL HIGH (ref 70–99)
Potassium: 3.3 mmol/L — ABNORMAL LOW (ref 3.5–5.1)
Sodium: 144 mmol/L (ref 135–145)
Total Bilirubin: 1.6 mg/dL — ABNORMAL HIGH (ref 0.3–1.2)
Total Protein: 4.8 g/dL — ABNORMAL LOW (ref 6.5–8.1)

## 2019-11-15 LAB — CBC
HCT: 21.5 % — ABNORMAL LOW (ref 36.0–46.0)
Hemoglobin: 6.2 g/dL — CL (ref 12.0–15.0)
MCH: 32.1 pg (ref 26.0–34.0)
MCHC: 28.8 g/dL — ABNORMAL LOW (ref 30.0–36.0)
MCV: 111.4 fL — ABNORMAL HIGH (ref 80.0–100.0)
Platelets: 42 10*3/uL — ABNORMAL LOW (ref 150–400)
RBC: 1.93 MIL/uL — ABNORMAL LOW (ref 3.87–5.11)
RDW: 17.5 % — ABNORMAL HIGH (ref 11.5–15.5)
WBC: 6.1 10*3/uL (ref 4.0–10.5)
nRBC: 0.3 % — ABNORMAL HIGH (ref 0.0–0.2)

## 2019-11-15 LAB — PREPARE RBC (CROSSMATCH)

## 2019-11-15 MED ORDER — GLYCOPYRROLATE 0.2 MG/ML IJ SOLN
0.2000 mg | INTRAMUSCULAR | Status: DC | PRN
  Filled 2019-11-15: qty 1

## 2019-11-15 MED ORDER — POTASSIUM CHLORIDE 10 MEQ/100ML IV SOLN: 10.0000 meq | INTRAVENOUS | Status: DC

## 2019-11-15 MED ORDER — BIOTENE DRY MOUTH MT LIQD: 15.0000 mL | OROMUCOSAL | Status: DC | PRN

## 2019-11-15 MED ORDER — GLYCOPYRROLATE 1 MG PO TABS
1.0000 mg | ORAL_TABLET | ORAL | Status: DC | PRN
  Filled 2019-11-15: qty 1

## 2019-11-15 MED ORDER — ACETAMINOPHEN 650 MG RE SUPP: 650.0000 mg | Freq: Four times a day (QID) | RECTAL | Status: DC | PRN

## 2019-11-15 MED ORDER — MORPHINE SULFATE (PF) 4 MG/ML IV SOLN
4.0000 mg | Freq: Once | INTRAVENOUS | Status: AC
  Administered 2019-11-15: 4 mg via INTRAVENOUS
  Filled 2019-11-15: qty 1

## 2019-11-15 MED ORDER — LORAZEPAM 2 MG/ML IJ SOLN
1.0000 mg | Freq: Once | INTRAMUSCULAR | Status: AC
  Administered 2019-11-15: 1 mg via INTRAVENOUS
  Filled 2019-11-15: qty 1

## 2019-11-15 MED ORDER — LORAZEPAM 2 MG/ML IJ SOLN
1.0000 mg | INTRAMUSCULAR | Status: DC | PRN
  Administered 2019-11-15: 1 mg via INTRAVENOUS
  Filled 2019-11-15: qty 1

## 2019-11-15 MED ORDER — LORAZEPAM 2 MG/ML IJ SOLN
1.0000 mg | Freq: Once | INTRAMUSCULAR | Status: AC
  Administered 2019-11-15: 1 mg via INTRAVENOUS
  Filled 2019-11-15 (×2): qty 1

## 2019-11-15 MED ORDER — POLYVINYL ALCOHOL 1.4 % OP SOLN
1.0000 [drp] | Freq: Four times a day (QID) | OPHTHALMIC | Status: DC | PRN
  Filled 2019-11-15: qty 15

## 2019-11-15 MED ORDER — MORPHINE 100MG IN NS 100ML (1MG/ML) PREMIX INFUSION
2.0000 mg/h | INTRAVENOUS | Status: DC
  Administered 2019-11-15: 2 mg/h via INTRAVENOUS
  Filled 2019-11-15: qty 100

## 2019-11-15 MED ORDER — SODIUM CHLORIDE 0.9% IV SOLUTION: Freq: Once | INTRAVENOUS | Status: DC

## 2019-11-15 MED ORDER — MORPHINE BOLUS VIA INFUSION
2.0000 mg | INTRAVENOUS | Status: DC | PRN
  Filled 2019-11-15: qty 2

## 2019-11-15 MED ORDER — GLYCOPYRROLATE 1 MG PO TABS: 1.0000 mg | ORAL_TABLET | ORAL | Status: AC | PRN

## 2019-11-15 MED ORDER — ACETAMINOPHEN 325 MG PO TABS: 650.0000 mg | ORAL_TABLET | Freq: Four times a day (QID) | ORAL | Status: DC | PRN

## 2019-11-15 MED ORDER — MORPHINE 100MG IN NS 100ML (1MG/ML) PREMIX INFUSION: 2.0000 mg/h | INTRAVENOUS | Status: AC

## 2019-11-15 MED ORDER — FUROSEMIDE 10 MG/ML IJ SOLN: 20.0000 mg | Freq: Once | INTRAMUSCULAR | Status: DC

## 2019-11-15 MED ORDER — LORAZEPAM 2 MG/ML IJ SOLN: 1.0000 mg | INTRAMUSCULAR | 0 refills | Status: AC | PRN

## 2019-11-15 NOTE — Progress Notes (Signed)
Palliative Medicine RN Note: I rec'd a call from [place], [place] on [place]. Patient has been made comfort care by attending team, but husband still has questions about care. I have paged out a request to our team to see if anyone is available to consult today.  Marjie Skiff Amala Petion, RN, BSN, [facility] Palliative Medicine Team 11/15/2019 10:09 AM Office [phone]

## 2019-11-15 NOTE — Progress Notes (Addendum)
CRITICAL VALUE ALERT  Critical Value:  6.2 hemoglobin  Date & Time Notied:  11/15/19, 0605  Provider Notified: Lang Snow FNP  Orders Received/Actions taken: 2 units RBCs, blood vitals, post tranfusion H&H

## 2019-11-15 NOTE — Progress Notes (Signed)
This chaplain responded to PMT consult for EOL spiritual care.  Pt. sleeping at time of visit.  Family was not present bedside.  Chaplain will F/U.

## 2019-11-15 NOTE — TOC Initial Note (Signed)
Transition of Care [facility]) - Initial/Assessment Note    Patient Details  Name: [name] MRN: [id number] Date of Birth: 06/09/1955  [facility] CM/SW Contact:    Curlene Labrum, RN Phone Number: 11/15/2019, 9:55 AM  Clinical Narrative:                 Case management spoke with the patient's husband and daughter in the room requesting inpatient hospice in [facility].  Support given to the family and a call was made to [facility] at [phone].  Palliative care will see the patient.  Will follow for inpatient hospice bed availability.  Expected Discharge Plan: [place] Barriers to Discharge: Continued Medical Work up (referral made to [facility])   Patient Goals and CMS Choice Patient states their goals for this hospitalization and ongoing recovery are:: The family would like the patient to be transferred to an inpatient hospice facility. CMS Medicare.gov Compare Post Acute Care list provided to:: Patient Represenative (must comment) (patient's husband and daughter) Choice offered to / list presented to : Spouse  Expected Discharge Plan and Services Expected Discharge Plan: [place] In-house Referral: Hospice / Palliative Care Discharge Planning Services: CM Consult Post Acute Care Choice: Hospice Living arrangements for the past 2 months: Single Family Home                                      Prior Living Arrangements/Services Living arrangements for the past 2 months: Single Family Home Lives with:: Spouse Patient language and need for interpreter reviewed:: Yes Do you feel safe going back to the place where you live?: Yes      Need for Family Participation in Patient Care: Yes (Comment) Care giver support system in place?: Yes (comment)   Criminal Activity/Legal Involvement Pertinent to Current Situation/Hospitalization: No - Comment as needed  Activities of Daily Living   ADL Screening  (condition at time of admission) Patient's cognitive ability adequate to safely complete daily activities?: No Is the patient deaf or have difficulty hearing?: No Does the patient have difficulty seeing, even when wearing glasses/contacts?: No Does the patient have difficulty concentrating, remembering, or making decisions?: Yes Patient able to express need for assistance with ADLs?: No Does the patient have difficulty dressing or bathing?: Yes Independently performs ADLs?: No Does the patient have difficulty walking or climbing stairs?: Yes Weakness of Legs: Both Weakness of Arms/Hands: Both  Permission Sought/Granted Permission sought to share information with : Case Manager Permission granted to share information with : Yes, Verbal Permission Granted     Permission granted to share info w AGENCY: [place] referral - [facility] [phone]  Permission granted to share info w Relationship: family     Emotional Assessment Appearance:: Appears stated age     Orientation: :  (dissoriented to self, place, time, situation) Alcohol / Substance Use: Not Applicable Psych Involvement: No (comment)  Admission diagnosis:  Acute cystitis with hematuria [N30.01] Increased ammonia level [R79.89] Acute hepatic encephalopathy [K72.00] Altered mental status, unspecified altered mental status type [Z60.10] Acute metabolic encephalopathy [X32.35] Patient Active Problem List   Diagnosis Date Noted  . Acute metabolic encephalopathy 57/32/2025  . Acute hepatic encephalopathy 11/13/2019  . Macrocytic anemia 11/13/2019  . 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 Coronary atherosclerosis 09/02/2018  . Eye muscle paralysis 06/24/2018  . Neuropathy associated with cancer ([place]) 05/24/2018  .  Urge incontinence of urine 04/25/2018  . Osteoporosis 11/09/2017  . DM (diabetes mellitus) type II uncontrolled, periph vascular disorder ([place]) 10/10/2016  . 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 Port catheter in place 12/17/2015  . Presence of IVC filter   . Chemotherapy-induced neuropathy ([place]) 12/25/2014  . Goals of care, counseling/discussion 12/06/2014  . History of breast cancer in female 12/06/2014  . Family history of colon cancer 12/06/2014  . Paroxysmal atrial fibrillation ([place]) 09/25/2014  . Preventative health care 09/17/2014  . Chronic combined systolic and diastolic CHF (congestive heart failure) ([place])   . Essential hypertension   . Antineoplastic chemotherapy induced pancytopenia ([place])   . Malnutrition of moderate degree ([place]) 07/30/2014  . Elevated LFTs   . Weakness 07/29/2014  . Long term current use of anticoagulant therapy 07/29/2014  . Hyperbilirubinemia 07/29/2014  . Hypoalbuminemia 07/29/2014  . Chronic atrial fibrillation ([place]) 07/28/2014  . DM (diabetes mellitus), type 2, uncontrolled, with renal complications ([place]) 53/00/5110  . Malignant neoplasm of ovary ([place]) 07/06/2014  . Malignant neoplasm of overlapping sites of right breast in female, estrogen receptor positive ([place]) 04/03/2014  . Chronic diastolic CHF (congestive heart failure), NYHA class 1 ([place]) 09/16/2012  . DJD (degenerative joint disease) of lumbar spine   . Microcytic anemia 07/12/2012  . Thrombocytopenia ([place]) 07/12/2012  . Malignant neoplasm of female breast ([place]) 05/27/2012  . Allergic rhinitis, cause unspecified 07/28/2011  . Cirrhosis of liver ([place])   . IRON DEFICIENCY 06/08/2009  . Hepatic steatosis 09/28/2008  . SPLENOMEGALY 09/28/2008  . Unspecified vitamin D deficiency 08/16/2008  . 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 ARTHRITIS 03/30/2007  . Hypothyroidism 03/24/2007  . Obstructive sleep apnea 03/24/2007  . Hyperlipidemia 10/06/2006  . Depression 10/06/2006  . GERD 10/06/2006  . DVT, HX OF 10/06/2006   PCP:  Ann Held, DO Pharmacy:   [place], [facility], [address] [place], [place] [address] Phone: [phone] Fax: [phone]  [address], [place] [address] [place] [address] Phone: [phone] Fax: [place], [place] - [address] [facility]. [address] [place] [address] Phone: [phone] Fax: [phone]     Social Determinants of Health (SDOH) Interventions    Readmission Risk Interventions Readmission Risk Prevention Plan 11/15/2019  Transportation Screening Complete  Medication Review ([place]) Complete  PCP or Specialist appointment within 3-5 days of discharge Complete  HRI or Home Care Consult Complete  SW Recovery Care/Counseling Consult Complete  Palliative Care Screening Complete  Skilled Nursing Facility Complete  Some recent data might be hidden

## 2019-11-15 NOTE — Progress Notes (Signed)
   11/15/19 0701  Assess: MEWS Score  Temp 97.9 F (36.6 C)  BP 130/60  Pulse Rate 96  Resp (!) 28  SpO2 93 %  O2 Device Nasal Cannula  O2 Flow Rate (L/min) 2 L/min  Assess: MEWS Score  MEWS Temp 0  MEWS Systolic 0  MEWS Pulse 0  MEWS RR 2  MEWS LOC 1  MEWS Score 3  MEWS Score Color Yellow  Assess: if the MEWS score is Yellow or Red  Were vital signs taken at a resting state? Yes  Focused Assessment Documented focused assessment  Early Detection of Sepsis Score *See Row Information* High  MEWS guidelines implemented *See Row Information* Yes  Treat  MEWS Interventions Administered scheduled meds/treatments  Take Vital Signs  Increase Vital Sign Frequency  Yellow: Q 2hr X 2 then Q 4hr X 2, if remains yellow, continue Q 4hrs  Escalate  MEWS: Escalate Yellow: discuss with charge nurse/RN and consider discussing with provider and RRT  Notify: Charge Nurse/RN  Name of Charge Nurse/RN Notified Sharyn Lull RN  Date Charge Nurse/RN Notified 08/16/19  Time Charge Nurse/RN Notified 0715  Notify: Provider  Provider Name/Title Olevia Bowens MD  Date Provider Notified 11/15/19  Time Provider Notified 0730  Notification Type Page  Notification Reason Change in status  Response Other (Comment) (Previous orders)  Document  Patient Outcome Other (Comment) (getting blood)  Progress note created (see row info) Yes

## 2019-11-15 NOTE — Progress Notes (Signed)
Pt's family stated they are ready for pt to transition to hospice care and would ideally like pt to go to a hospice home. Pt responds to voice. Currently receiving blood. Has labored breathing, on oxygen sating 94%. MD made aware and will come assess pt and speak with family. CM alerted to family wanting hospice. AC came and spoke with family about visitation. Will allow EOL visitation policy. Family in agreement.

## 2019-11-15 NOTE — Discharge Summary (Signed)
Physician Discharge Summary  [name] ZLD:[id number] DOB: 1956/03/25 DOA: 11/13/2019  PCP: Ann Held, DO  Admit date: 11/13/2019 Discharge date: 11/15/2019  Admitted From: Home Disposition: Residential hospice facility   Recommendations for Outpatient Follow-up:    Home Health:no Equipment/Devices:none  Discharge Condition:Stable CODE STATUS:DNR Diet recommendation: Heart Healthy  Brief/Interim Summary: 64 y.o. 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 Havery Moros she refused EGD and colonoscopies.  Discharge Diagnoses:  Active Problems:   Cirrhosis of liver (HCC)   Chronic atrial fibrillation (HCC)   Chronic combined systolic and diastolic CHF (congestive heart failure) ([place])   Essential hypertension   DM (diabetes mellitus) type II uncontrolled, periph vascular disorder (HCC)   Acute metabolic encephalopathy   Acute hepatic encephalopathy   Macrocytic anemia   Acute encephalopathy   Comfort measures only status   Palliative care by specialist   DNR (do not resuscitate)  Acute metabolic encephalopathy likely due to hepatic encephalopathy: The patient was started on rectal lactulose and IV empiric antibiotics, she was also started on IV fluids with minimal improvement.  She continued to be significantly confused and not improving. After long discussion with the family decided to move towards comfort care as the patient was not improving the risk and benefits were explained. The wanted all the medications DC'd and all lab draws were stopped. And only to continue IV medications for comfort. They also requested residential hospice facility placement which she will be transferred to. She was started on a morphine drip and  Ativan.  Melena/normocytic anemia: She had 1 large bowel movement in house, the patient diffuse EGD at [facility] office. As she was not improving mentally due to her hepatic encephalopathy it was explained to the family that she would not benefit for EGD and they decided not to proceed.  Lactic acidosis: Likely due to hypovolemia.  Cirrhosis of the liver Chronic combined systolic and diastolic heart failure Essential hypertension Diabetes mellitus type 2 Chronic thrombocytopenia  Discharge Instructions  Discharge Instructions    Diet - low sodium heart healthy   Complete by: As directed    Increase activity slowly   Complete by: As directed      Allergies as of 11/15/2019      Reactions   Olmesartan Medoxomil Cough   Adhesive [tape] Rash   Tetracycline Hcl Other (See Comments)   Venlafaxine Other (See Comments)   severe dry mouth      Medication List    STOP taking these medications   acetaminophen 500 MG tablet Commonly known as: TYLENOL   ALPRAZolam 0.5 MG tablet Commonly known as: XANAX   buPROPion 300 MG 24 hr tablet Commonly known as: WELLBUTRIN XL   escitalopram 20 MG tablet Commonly known as: LEXAPRO   furosemide 20 MG tablet Commonly known as: LASIX   levothyroxine 150 MCG tablet Commonly known as: SYNTHROID   lidocaine-prilocaine cream Commonly known as: EMLA   metFORMIN 500 MG 24 hr tablet Commonly known as: GLUCOPHAGE-XR   OLANZapine 2.5 MG tablet Commonly known as: ZYPREXA   omeprazole 20 MG capsule Commonly known as: Advice worker Lancets 90Z Misc   OneTouch Verio w/Device Kit   oxybutynin 5 MG tablet Commonly known as: DITROPAN   potassium chloride 10 MEQ tablet Commonly  known as: KLOR-CON   pravastatin 10 MG tablet Commonly known as: PRAVACHOL     TAKE these medications   glycopyrrolate 1 MG tablet Commonly known as: ROBINUL Take 1 tablet (1 mg total) by mouth every 4 (four) hours as needed (excessive  secretions).   LORazepam 2 MG/ML injection Commonly known as: ATIVAN Inject 0.5 mLs (1 mg total) into the vein every 4 (four) hours as needed for sedation.   morphine 1 mg/mL Soln infusion Inject 2-5 mg/hr into the vein continuous.   ondansetron 8 MG tablet Commonly known as: ZOFRAN TAKE 1 TABLET BY MOUTH  EVERY 8 HOURS AS NEEDED FOR NAUSEA OR VOMITING What changed: See the new instructions.   OneTouch Verio test strip Generic drug: glucose blood USE TO CHECK BLOOD SUGAR  TWO TIMES DAILY. Dx Code: E11.9   propranolol 20 MG tablet Commonly known as: INDERAL TAKE 1 TABLET BY MOUTH  TWICE DAILY   Toremifene Citrate 60 MG tablet Commonly known as: FARESTON Take 1 tablet (60 mg total) by mouth daily. Start 08/05/2019       Allergies  Allergen Reactions  . Olmesartan Medoxomil Cough  . Adhesive [Tape] Rash  . Tetracycline Hcl Other (See Comments)  . Venlafaxine Other (See Comments)    severe dry mouth    Consultations:  None   Procedures/Studies: DG Chest 2 View  Result Date: 11/13/2019 CLINICAL DATA:  Shortness of breath and altered mental status. EXAM: CHEST - 2 VIEW COMPARISON:  PET-CT dated October 27, 2019. Chest x-ray dated October 17, 2016. FINDINGS: Unchanged left chest wall port catheter. Stable cardiomegaly. Atherosclerotic calcification of the aortic arch. Mild pulmonary vascular congestion interstitial thickening. Decreased now small right pleural effusion. No left pleural effusion. No consolidation or pneumothorax. Unchanged right apical pleuroparenchymal scarring. No acute osseous abnormality. IMPRESSION: 1. Decreased now small right pleural effusion. 2. Mild interstitial pulmonary edema. Electronically Signed   By: Titus Dubin M.D.   On: 11/13/2019 14:54   CT HEAD WO CONTRAST  Result Date: 11/13/2019 CLINICAL DATA:  Encephalopathy. Altered mental status since last night. EXAM: CT HEAD WITHOUT CONTRAST TECHNIQUE: Contiguous axial images were obtained from the base  of the skull through the vertex without intravenous contrast. COMPARISON:  07/09/2017 FINDINGS: Brain: Ventricles and cisterns are normal. Mild atrophy unchanged. Mild chronic ischemic microvascular disease is present. There is no mass, mass effect, shift of midline structures or acute hemorrhage. No evidence of acute infarction. Vascular: No hyperdense vessel or unexpected calcification. Skull: Normal. Negative for fracture or focal lesion. Sinuses/Orbits: Orbits are normal. Near complete opacification over the left frontal, ethmoid and maxillary sinuses compatible with chronic inflammatory change. Other: None IMPRESSION: 1.  No acute findings. 2.  Atrophy and chronic ischemic microvascular disease. 3. Complete opacification of the left frontal, ethmoid and maxillary sinuses compatible with moderate chronic inflammatory disease. Electronically Signed   By: Marin Olp M.D.   On: 11/13/2019 15:48   NM PET Image Restag (PS) Skull Base To Thigh  Result Date: 10/27/2019 CLINICAL DATA:  Subsequent treatment strategy for ovarian cancer. EXAM: NUCLEAR MEDICINE PET SKULL BASE TO THIGH TECHNIQUE: 8.0 mCi F-18 FDG was injected intravenously. Full-ring PET imaging was performed from the skull base to thigh after the radiotracer. CT data was obtained and used for attenuation correction and anatomic localization. Fasting blood glucose: 65 mg/dl COMPARISON:  04/11/2019 FINDINGS: Mediastinal blood pool activity: SUV max 2.2 Liver activity: SUV max NA NECK: No hypermetabolic lymph nodes in the neck. Opacification of the left maxillary  sinus is stable since 04/11/2019, but has progressed since orbital MRI of 07/03/2018. Opacification extends into the left ethmoid air cells. There is low level hypermetabolism associated with this process. Incidental CT findings: none CHEST: Previously identified prevascular node is stable at 9 mm short axis with low level FDG uptake (2.5 today compared to 3.0 previously). 11 mm nodular focus of  airspace opacity in the anterior right upper lung (image 45/6) is hypermetabolic with SUV max = 4.3. Incidental CT findings: As on the prior study, there are numerous upper normal to borderline enlarged mediastinal lymph nodes. Hilar regions suboptimally evaluated due to lack of intravenous contrast material. Paraesophageal varices noted. There is no axillary lymphadenopathy. Left Port-A-Cath tip is positioned at the SVC/RA junction. Right pleural effusion is mildly progressed in the interval, now small to moderate in size. There is a new tiny left pleural effusion. Patchy airspace disease seen in the lungs previously has improved in the interval but has not resolved. There is a new 2.2 cm ground-glass opacity in the anterior left upper lobe. Fine detail of lung parenchyma obscured by breathing motion during image acquisition. ABDOMEN/PELVIS: No abnormal hypermetabolic activity within the liver, pancreas, adrenal glands, or spleen. Interval resolution of hypermetabolic left para-aortic retroperitoneal lymphadenopathy seen on the prior study. The bulky 3.6 x 2.9 cm left para-aortic node measured previously is now 1.0 x 1.0 cm (126/4). No residual hypermetabolism in this lymph node on today's study. The new lower left para-aortic lymphadenopathy identified on the previous study has also resolved. 2.3 x 1.9 cm lymph node seen on that study now measures 1.2 x 1.0 cm on image 135/4. No residual hypermetabolism. Low-density left renal lesion identified previously shows no hypermetabolism on PET imaging today. Incidental CT findings: Cirrhotic liver morphology again noted. Gallbladder surgically absent. Stable splenomegaly. Moderate volume ascites is progressive in the interval with perisplenic and perihepatic fluid tracking in the para colic gutters down into the pelvis. Atherosclerotic calcification noted in the abdominal aorta. SKELETON: No focal hypermetabolic activity to suggest skeletal metastasis. Incidental CT  findings: Degenerative changes again noted left hip. IMPRESSION: 1. The hypermetabolic retroperitoneal lymphadenopathy seen on the previous study has resolved in the interval. Upper normal lymph nodes are present at these locations today, but show no residual hypermetabolism. 2. The upper normal prevascular lymph node in the thorax identified previously with low level FDG uptake is stable in the interval and remains nonspecific. There is additional borderline mediastinal lymphadenopathy without hypermetabolism today, also stable. Continued attention on follow-up recommended. 3. New irregular non confluent focus of nodular airspace opacity in the anterior right upper lobe measuring about 11 mm. There is hypermetabolism associated with this abnormality. Imaging features are not typical/classic for metastatic disease and focal infection/inflammation would be a consideration. Consider follow-up CT chest in 3 months to assess for resolution after therapy. 4. Cirrhotic liver morphology. Associated splenomegaly and apparent paraesophageal varices suggest associated portal venous hypertension. 5. Interval progression of ascites, now moderate volume. 6. Progressive bilateral pleural effusions, small to moderate on the right tiny on the left. 7. Opacification of the left maxillary sinus and left ethmoid air cells, new since 2019 and progressive since 2020. No definite obstructive features and only low level FDG uptake is identified in this region on PET imaging. Appearance likely reflects chronic sinusitis in an ostiomeatal complex obstructive pattern and ENT referral may prove helpful Electronically Signed   By: Misty Stanley M.D.   On: 10/27/2019 16:25      Subjective: Lethargic  Discharge  Exam: Vitals:   11/15/19 0701 11/15/19 1200  BP: 130/60   Pulse: 96   Resp: (!) 28 (P) 20  Temp: 97.9 F (36.6 C)   SpO2: 93%    Vitals:   11/15/19 0414 11/15/19 0646 11/15/19 0701 11/15/19 1200  BP: (!) 149/128 (!)  145/62 130/60   Pulse: 100 98 96   Resp: 16  (!) 28 (P) 20  Temp: 98.7 F (37.1 C) 97.6 F (36.4 C) 97.9 F (36.6 C)   TempSrc: Oral Axillary Axillary   SpO2:  93% 93%   Weight:      Height:        General: In no acute distress lying comfortably in bed. Cardiovascular: RRR, S1/S2 +, no rubs, no gallops Respiratory: CTA bilaterally, no wheezing, no rhonchi Abdominal: Soft, NT, ND, bowel sounds + Extremities: no edema, no cyanosis    The results of significant diagnostics from this hospitalization (including imaging, microbiology, ancillary and laboratory) are listed below for reference.     Microbiology: Recent Results (from the past 240 hour(s))  Urine culture     Status: Abnormal   Collection Time: 11/13/19  2:30 PM   Specimen: Urine, Random  Result Value Ref Range Status   Specimen Description URINE, RANDOM  Final   Special Requests   Final    NONE Performed at [facility], [address] [place], [place] [address]    Culture >=100,000 COLONIES/mL KLEBSIELLA PNEUMONIAE (A)  Final   Report Status 11/15/2019 FINAL  Final   Organism ID, Bacteria KLEBSIELLA PNEUMONIAE (A)  Final      Susceptibility   Klebsiella pneumoniae - MIC*    AMPICILLIN >=32 RESISTANT Resistant     CEFAZOLIN <=4 SENSITIVE Sensitive     CEFTRIAXONE <=0.25 SENSITIVE Sensitive     CIPROFLOXACIN <=0.25 SENSITIVE Sensitive     GENTAMICIN <=1 SENSITIVE Sensitive     IMIPENEM <=0.25 SENSITIVE Sensitive     NITROFURANTOIN 32 SENSITIVE Sensitive     TRIMETH/SULFA <=20 SENSITIVE Sensitive     AMPICILLIN/SULBACTAM 4 SENSITIVE Sensitive     PIP/TAZO <=4 SENSITIVE Sensitive     * >=100,000 COLONIES/mL KLEBSIELLA PNEUMONIAE  SARS Coronavirus 2 by RT PCR (hospital order, performed in [place] hospital lab) Nasopharyngeal Nasopharyngeal Swab     Status: None   Collection Time: 11/13/19  4:04 PM   Specimen: Nasopharyngeal Swab  Result Value Ref Range Status   SARS Coronavirus 2 NEGATIVE NEGATIVE  Final    Comment: (NOTE) SARS-CoV-2 target nucleic acids are NOT DETECTED.  The SARS-CoV-2 RNA is generally detectable in upper and lower respiratory specimens during the acute phase of infection. The lowest concentration of SARS-CoV-2 viral copies this assay can detect is 250 copies / mL. A negative result does not preclude SARS-CoV-2 infection and should not be used as the sole basis for treatment or other patient management decisions.  A negative result may occur with improper specimen collection / handling, submission of specimen other than nasopharyngeal swab, presence of viral mutation(s) within the areas targeted by this assay, and inadequate number of viral copies (<250 copies / mL). A negative result must be combined with clinical observations, patient history, and epidemiological information.  Fact Sheet for Patients:   StrictlyIdeas.no  Fact Sheet for Healthcare Providers: BankingDealers.co.za  This test is not yet approved or  cleared by the [place] FDA and has been authorized for detection and/or diagnosis of SARS-CoV-2 by FDA under an Emergency Use Authorization (EUA).  This EUA will remain  in effect (meaning this test can be used) for the duration of the COVID-19 declaration under Section 564(b)(1) of the Act, 21 U.S.C. section 360bbb-3(b)(1), unless the authorization is terminated or revoked sooner.  Performed at [facility], [place] [address] [place], [place] [address]      Labs: BNP (last 3 results) Recent Labs    11/13/19 1518  BNP 275.1*   Basic Metabolic Panel: Recent Labs  Lab 11/13/19 1248 11/13/19 1427 11/14/19 0403 11/15/19 0447  NA 142 146* 145 144  K 4.0 3.4* 2.9* 3.3*  CL 112*  --  118* 117*  CO2 16*  --  18* 17*  GLUCOSE 98  --  118* 117*  BUN 12  --  11 9  CREATININE 0.65  --  0.67 0.58  CALCIUM 7.9*  --  7.5* 7.2*   Liver Function Tests: Recent Labs  Lab 11/13/19 1248  11/14/19 0403 11/15/19 0447  AST 81* 43* 46*  ALT '26 24 24  ' ALKPHOS 76 66 61  BILITOT 1.7* 1.7* 1.6*  PROT 5.8* 5.1* 4.8*  ALBUMIN 2.3* 2.0* 1.9*   No results for input(s): LIPASE, AMYLASE in the last 168 hours. Recent Labs  Lab 11/13/19 1422 11/14/19 1118  AMMONIA 119* 61*   CBC: Recent Labs  Lab 11/13/19 1248 11/13/19 1422 11/13/19 1427 11/14/19 0403 11/15/19 0447  WBC 6.6  6.6  --   --  4.5 6.1  NEUTROABS 6.2  --   --   --   --   HGB 7.2*  7.3*  --  6.5* 7.2* 6.2*  HCT 24.0*  24.7*  --  19.0* 23.8* 21.5*  MCV 111.1*  112.3*  --   --  108.2* 111.4*  PLT ACLMP  ACLMP 83*  --  60* 42*   Cardiac Enzymes: No results for input(s): CKTOTAL, CKMB, CKMBINDEX, TROPONINI in the last 168 hours. BNP: Invalid input(s): POCBNP CBG: Recent Labs  Lab 11/13/19 1239 11/13/19 1419  GLUCAP 87 104*   D-Dimer No results for input(s): DDIMER in the last 72 hours. Hgb A1c No results for input(s): HGBA1C in the last 72 hours. Lipid Profile No results for input(s): CHOL, HDL, LDLCALC, TRIG, CHOLHDL, LDLDIRECT in the last 72 hours. Thyroid function studies Recent Labs    11/13/19 1422  TSH 1.686   Anemia work up No results for input(s): VITAMINB12, FOLATE, FERRITIN, TIBC, IRON, RETICCTPCT in the last 72 hours. Urinalysis    Component Value Date/Time   COLORURINE AMBER (A) 11/13/2019 1432   APPEARANCEUR CLOUDY (A) 11/13/2019 1432   LABSPEC 1.018 11/13/2019 1432   PHURINE 5.0 11/13/2019 1432   GLUCOSEU NEGATIVE 11/13/2019 1432   HGBUR MODERATE (A) 11/13/2019 1432   HGBUR negative 09/06/2009 0830   BILIRUBINUR NEGATIVE 11/13/2019 1432   BILIRUBINUR neg 12/23/2018 1457   KETONESUR 5 (A) 11/13/2019 1432   PROTEINUR 30 (A) 11/13/2019 1432   UROBILINOGEN 2.0 (A) 12/23/2018 1457   UROBILINOGEN 1 09/15/2014 1602   NITRITE NEGATIVE 11/13/2019 1432   LEUKOCYTESUR MODERATE (A) 11/13/2019 1432   Sepsis Labs Invalid input(s): PROCALCITONIN,  WBC,   LACTICIDVEN Microbiology Recent Results (from the past 240 hour(s))  Urine culture     Status: Abnormal   Collection Time: 11/13/19  2:30 PM   Specimen: Urine, Random  Result Value Ref Range Status   Specimen Description URINE, RANDOM  Final   Special Requests   Final    NONE Performed at [facility], [place] [address] [place],  [address]  Culture >=100,000 COLONIES/mL KLEBSIELLA PNEUMONIAE (A)  Final   Report Status 11/15/2019 FINAL  Final   Organism ID, Bacteria KLEBSIELLA PNEUMONIAE (A)  Final      Susceptibility   Klebsiella pneumoniae - MIC*    AMPICILLIN >=32 RESISTANT Resistant     CEFAZOLIN <=4 SENSITIVE Sensitive     CEFTRIAXONE <=0.25 SENSITIVE Sensitive     CIPROFLOXACIN <=0.25 SENSITIVE Sensitive     GENTAMICIN <=1 SENSITIVE Sensitive     IMIPENEM <=0.25 SENSITIVE Sensitive     NITROFURANTOIN 32 SENSITIVE Sensitive     TRIMETH/SULFA <=20 SENSITIVE Sensitive     AMPICILLIN/SULBACTAM 4 SENSITIVE Sensitive     PIP/TAZO <=4 SENSITIVE Sensitive     * >=100,000 COLONIES/mL KLEBSIELLA PNEUMONIAE  SARS Coronavirus 2 by RT PCR (hospital order, performed in [place] hospital lab) Nasopharyngeal Nasopharyngeal Swab     Status: None   Collection Time: 11/13/19  4:04 PM   Specimen: Nasopharyngeal Swab  Result Value Ref Range Status   SARS Coronavirus 2 NEGATIVE NEGATIVE Final    Comment: (NOTE) SARS-CoV-2 target nucleic acids are NOT DETECTED.  The SARS-CoV-2 RNA is generally detectable in upper and lower respiratory specimens during the acute phase of infection. The lowest concentration of SARS-CoV-2 viral copies this assay can detect is 250 copies / mL. A negative result does not preclude SARS-CoV-2 infection and should not be used as the sole basis for treatment or other patient management decisions.  A negative result may occur with improper specimen collection / handling, submission of specimen other than nasopharyngeal swab, presence of viral  mutation(s) within the areas targeted by this assay, and inadequate number of viral copies (<250 copies / mL). A negative result must be combined with clinical observations, patient history, and epidemiological information.  Fact Sheet for Patients:   StrictlyIdeas.no  Fact Sheet for Healthcare Providers: BankingDealers.co.za  This test is not yet approved or  cleared by the [place] FDA and has been authorized for detection and/or diagnosis of SARS-CoV-2 by FDA under an Emergency Use Authorization (EUA).  This EUA will remain in effect (meaning this test can be used) for the duration of the COVID-19 declaration under Section 564(b)(1) of the Act, 21 U.S.C. section 360bbb-3(b)(1), unless the authorization is terminated or revoked sooner.  Performed at [facility], [place] [address], [place], [place] [address]      Time coordinating discharge: Over 30 minutes  SIGNED:   Charlynne Cousins, MD  [facility] 11/15/2019, 12:16 PM Pager   If 7PM-7AM, please contact night-coverage [email or web address] Password [email or web address]

## 2019-11-15 NOTE — Progress Notes (Signed)
[facility]. Report called in to [facility]. Awaiting transportation.

## 2019-11-15 NOTE — Progress Notes (Signed)
[facility] [place] to the pt's spouse and daughter to discuss hospice care and comfort care approach. They are in agreement with full comfort and verify the pt is a DNR.  The pt has been approved for Hospice care at [facility].  Report number for nurse to call [phone]  Updated Sharyn Lull and she will work on d/c and set up ambulance. Webb Silversmith RN

## 2019-11-15 NOTE — Progress Notes (Signed)
Patient with BM x2 consisting of dark red blood and clots, see results for hemoglobin this morning.  Potassium also low this morning, see results.  Lang Snow FNP notified of both, see new orders.  Blood transfusion initiated per orders, RN at the bedside.

## 2019-11-15 NOTE — Progress Notes (Signed)
[facility] PROGRESS NOTE    Progress Note  [name]  GHW:[id number] DOB: 10-17-1955 DOA: 11/13/2019 PCP: Ann Held, DO     Brief Narrative:   [name] is an 64 y.o. 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 Havery Moros she refused EGD and colonoscopies.  Assessment/Plan:   Acute metabolic encephalopathy: Of unclear etiology, most likely there is a component of hepatic encephalopathy. After long discussion with the family they decided to move towards comfort care they would like to discontinue all treatment options and give her medication to make her comfortable.  I will go ahead and DC all lab checks and all medications nonessential will continue her morphine will change it to IV infusion and give her Ativan for agitation. 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  And she has verbalized to Dr. Havery Moros she does not want any further EGD and colonoscopies.  Normocytic anemia/Melena: No further transfusions no further CBG checks.  Lactic acidosis: Likely due to hypovolemia recheck lactic acid this morning.  Cirrhosis of the liver: Chronic combined systolic and diastolic heart failure: Essential hypertension: Diabetes mellitus type 2 uncontrolled with peripheral vascular disease: Chronic thrombocytopenia: Ethics: Discontinue all treatment options and are not related to comfort measures.  DVT prophylaxis: SCD Family Communication:husband Status is: Inpatient  Remains inpatient appropriate because:Hemodynamically unstable   Dispo:  The patient is from: Home              Anticipated d/c is to: Home              Anticipated d/c date is: > 3 days              Patient currently is not medically stable to d/c.     Code Status:     Code Status Orders  (From admission, onward)         Start     Ordered   11/13/19 1846  DNR Continuous        11/13/19 1845        Code Status History    Date Active Date Inactive Code Status Order ID Comments User Context   10/26/2014 1607 10/27/2014 0319 Full Code [id number]  Markus Daft, MD HOV   09/30/2014 1426 10/02/2014 1852 Full Code [id number]  Markus Daft, MD Inpatient   09/25/2014 2024 09/30/2014 1426 Full Code [id number]  Orson Eva, MD Inpatient   07/28/2014 2203 08/08/2014 1858 Full Code [id number]  Rise Patience, MD Inpatient   07/11/2014 1021 07/12/2014 0334 Full Code [id number]  Greggory Keen, MD HOV   06/27/2014 1943 06/30/2014 1635 Full Code [id number]  Lahoma Crocker, MD Inpatient   09/14/2012 1407 09/28/2012 1459 Full Code [id number]  Meredith Staggers, MD Inpatient   09/13/2012 1653 09/13/2012 1834 DNR [id number]  Bary Leriche, PA-C Inpatient   09/07/2012 1405 09/13/2012 1653 DNR [id number]  Oswald Hillock, MD ED   08/24/2012 1536 09/06/2012 1654 Full Code [id number]  Eugenie Filler, MD ED   07/12/2012 1504 07/21/2012 1416 Full Code [id number]  Rama, Venetia Maxon, MD Inpatient   06/08/2012 1132 06/09/2012  1616 Full Code [id number]  Jacklynn Ganong, RN Inpatient   Advance Care Planning Activity        IV Access:    Peripheral IV   Procedures and diagnostic studies:   DG Chest 2 View  Result Date: 11/13/2019 CLINICAL DATA:  Shortness of breath and altered mental status. EXAM: CHEST - 2 VIEW COMPARISON:  PET-CT dated October 27, 2019. Chest x-ray dated October 17, 2016. FINDINGS: Unchanged left chest wall port catheter. Stable cardiomegaly. Atherosclerotic calcification of the aortic arch. Mild pulmonary vascular congestion interstitial thickening. Decreased now small right pleural  effusion. No left pleural effusion. No consolidation or pneumothorax. Unchanged right apical pleuroparenchymal scarring. No acute osseous abnormality. IMPRESSION: 1. Decreased now small right pleural effusion. 2. Mild interstitial pulmonary edema. Electronically Signed   By: Titus Dubin M.D.   On: 11/13/2019 14:54   CT HEAD WO CONTRAST  Result Date: 11/13/2019 CLINICAL DATA:  Encephalopathy. Altered mental status since last night. EXAM: CT HEAD WITHOUT CONTRAST TECHNIQUE: Contiguous axial images were obtained from the base of the skull through the vertex without intravenous contrast. COMPARISON:  07/09/2017 FINDINGS: Brain: Ventricles and cisterns are normal. Mild atrophy unchanged. Mild chronic ischemic microvascular disease is present. There is no mass, mass effect, shift of midline structures or acute hemorrhage. No evidence of acute infarction. Vascular: No hyperdense vessel or unexpected calcification. Skull: Normal. Negative for fracture or focal lesion. Sinuses/Orbits: Orbits are normal. Near complete opacification over the left frontal, ethmoid and maxillary sinuses compatible with chronic inflammatory change. Other: None IMPRESSION: 1.  No acute findings. 2.  Atrophy and chronic ischemic microvascular disease. 3. Complete opacification of the left frontal, ethmoid and maxillary sinuses compatible with moderate chronic inflammatory disease. Electronically Signed   By: Marin Olp M.D.   On: 11/13/2019 15:48     Medical Consultants:    None.  Anti-Infectives:   rocephin  Subjective:    [name] lethargic  Objective:    Vitals:   11/14/19 1925 11/15/19 0414 11/15/19 0646 11/15/19 0701  BP: (!) 131/50 (!) 149/128 (!) 145/62 130/60  Pulse: 95 100 98 96  Resp: 16 16  (!) 28  Temp: 99.1 F (37.3 C) 98.7 F (37.1 C) 97.6 F (36.4 C) 97.9 F (36.6 C)  TempSrc: Oral Oral Axillary Axillary  SpO2: 94%  93% 93%  Weight:      Height:       SpO2: 93 % O2 Flow Rate  (L/min): 2 L/min   Intake/Output Summary (Last 24 hours) at 11/15/2019 0909 Last data filed at 11/15/2019 0000 Gross per 24 hour  Intake 0 ml  Output 500 ml  Net -500 ml   Filed Weights   11/14/19 1134  Weight: 71.5 kg    Exam: General exam: In no acute distress. Respiratory system: Good air movement and clear to auscultation. Cardiovascular system: S1 & S2 heard, RRR. No JVD. Gastrointestinal system: Abdomen is nondistended, soft and nontender.  Extremities: She has 1+ edema. Skin: No rashes, lesions or ulcers   Data Reviewed:    Labs: Basic Metabolic Panel: Recent Labs  Lab 11/13/19 1248 11/13/19 1248 11/13/19 1427 11/13/19 1427 11/14/19 0403 11/15/19 0447  NA 142  --  146*  --  145 144  K 4.0   < > 3.4*   < > 2.9* 3.3*  CL 112*  --   --   --  118* 117*  CO2 16*  --   --   --  18* 17*  GLUCOSE  98  --   --   --  118* 117*  BUN 12  --   --   --  11 9  CREATININE 0.65  --   --   --  0.67 0.58  CALCIUM 7.9*  --   --   --  7.5* 7.2*   < > = values in this interval not displayed.   GFR Estimated Creatinine Clearance: 69.5 mL/min (by C-G formula based on SCr of 0.58 mg/dL). Liver Function Tests: Recent Labs  Lab 11/13/19 1248 11/14/19 0403 11/15/19 0447  AST 81* 43* 46*  ALT 26 24 24   ALKPHOS 76 66 61  BILITOT 1.7* 1.7* 1.6*  PROT 5.8* 5.1* 4.8*  ALBUMIN 2.3* 2.0* 1.9*   No results for input(s): LIPASE, AMYLASE in the last 168 hours. Recent Labs  Lab 11/13/19 1422 11/14/19 1118  AMMONIA 119* 61*   Coagulation profile Recent Labs  Lab 11/13/19 1422  INR 1.9*   COVID-19 Labs  No results for input(s): DDIMER, FERRITIN, LDH, CRP in the last 72 hours.  Lab Results  Component Value Date   [place] NEGATIVE 11/13/2019    CBC: Recent Labs  Lab 11/13/19 1248 11/13/19 1422 11/13/19 1427 11/14/19 0403 11/15/19 0447  WBC 6.6  6.6  --   --  4.5 6.1  NEUTROABS 6.2  --   --   --   --   HGB 7.2*  7.3*  --  6.5* 7.2* 6.2*  HCT 24.0*  24.7*   --  19.0* 23.8* 21.5*  MCV 111.1*  112.3*  --   --  108.2* 111.4*  PLT ACLMP  ACLMP 83*  --  60* 42*   Cardiac Enzymes: No results for input(s): CKTOTAL, CKMB, CKMBINDEX, TROPONINI in the last 168 hours. BNP (last 3 results) No results for input(s): PROBNP in the last 8760 hours. CBG: Recent Labs  Lab 11/13/19 1239 11/13/19 1419  GLUCAP 87 104*   D-Dimer: No results for input(s): DDIMER in the last 72 hours. Hgb A1c: No results for input(s): HGBA1C in the last 72 hours. Lipid Profile: No results for input(s): CHOL, HDL, LDLCALC, TRIG, CHOLHDL, LDLDIRECT in the last 72 hours. Thyroid function studies: Recent Labs    11/13/19 1422  TSH 1.686   Anemia work up: No results for input(s): VITAMINB12, FOLATE, FERRITIN, TIBC, IRON, RETICCTPCT in the last 72 hours. Sepsis Labs: Recent Labs  Lab 11/13/19 1248 11/13/19 1422 11/13/19 1528 11/14/19 0403 11/14/19 1118 11/15/19 0447  WBC 6.6  6.6  --   --  4.5  --  6.1  LATICACIDVEN  --  3.1* 3.5*  --  1.7  --    Microbiology Recent Results (from the past 240 hour(s))  Urine culture     Status: Abnormal   Collection Time: 11/13/19  2:30 PM   Specimen: Urine, Random  Result Value Ref Range Status   Specimen Description URINE, RANDOM  Final   Special Requests   Final    NONE Performed at [facility], [address], [place], [place] [address]    Culture >=100,000 COLONIES/mL KLEBSIELLA PNEUMONIAE (A)  Final   Report Status 11/15/2019 FINAL  Final   Organism ID, Bacteria KLEBSIELLA PNEUMONIAE (A)  Final      Susceptibility   Klebsiella pneumoniae - MIC*    AMPICILLIN >=32 RESISTANT Resistant     CEFAZOLIN <=4 SENSITIVE Sensitive     CEFTRIAXONE <=0.25 SENSITIVE Sensitive     CIPROFLOXACIN <=0.25 SENSITIVE Sensitive     GENTAMICIN <=  1 SENSITIVE Sensitive     IMIPENEM <=0.25 SENSITIVE Sensitive     NITROFURANTOIN 32 SENSITIVE Sensitive     TRIMETH/SULFA <=20 SENSITIVE Sensitive     AMPICILLIN/SULBACTAM 4  SENSITIVE Sensitive     PIP/TAZO <=4 SENSITIVE Sensitive     * >=100,000 COLONIES/mL KLEBSIELLA PNEUMONIAE  SARS Coronavirus 2 by RT PCR (hospital order, performed in [place] hospital lab) Nasopharyngeal Nasopharyngeal Swab     Status: None   Collection Time: 11/13/19  4:04 PM   Specimen: Nasopharyngeal Swab  Result Value Ref Range Status   SARS Coronavirus 2 NEGATIVE NEGATIVE Final    Comment: (NOTE) SARS-CoV-2 target nucleic acids are NOT DETECTED.  The SARS-CoV-2 RNA is generally detectable in upper and lower respiratory specimens during the acute phase of infection. The lowest concentration of SARS-CoV-2 viral copies this assay can detect is 250 copies / mL. A negative result does not preclude SARS-CoV-2 infection and should not be used as the sole basis for treatment or other patient management decisions.  A negative result may occur with improper specimen collection / handling, submission of specimen other than nasopharyngeal swab, presence of viral mutation(s) within the areas targeted by this assay, and inadequate number of viral copies (<250 copies / mL). A negative result must be combined with clinical observations, patient history, and epidemiological information.  Fact Sheet for Patients:   StrictlyIdeas.no  Fact Sheet for Healthcare Providers: BankingDealers.co.za  This test is not yet approved or  cleared by the [place] FDA and has been authorized for detection and/or diagnosis of SARS-CoV-2 by FDA under an Emergency Use Authorization (EUA).  This EUA will remain in effect (meaning this test can be used) for the duration of the COVID-19 declaration under Section 564(b)(1) of the Act, 21 U.S.C. section 360bbb-3(b)(1), unless the authorization is terminated or revoked sooner.  Performed at [facility], [place] [address], [place], [place] [address]      Medications:   . sodium chloride   Intravenous  Once  .  morphine injection  4 mg Intravenous Once  . sodium chloride flush  3 mL Intravenous Once   Continuous Infusions: . morphine        LOS: 2 days   Charlynne Cousins  [facility]  11/15/2019, 9:09 AM

## 2019-11-15 NOTE — Consult Note (Signed)
Consultation Note Date: 11/15/2019   Patient Name: [name]  DOB: 1955/10/28  MRN: [id number]  Age / Sex: 64 y.o., female  PCP: Carollee Herter, Alferd Apa, DO Referring Physician: Aileen Fass, Tammi Klippel, MD  Reason for Consultation: Establishing goals of care  HPI/Patient Profile: 63 y.o. female  with past medical history of chronic systolic and diastolic heart failure, HTN, T2DM, fatty liver cirrhosis, breast cancer, ovarian cancer, and esophageal varicies admitted on 11/13/2019 with AMS. Found to have ammonia of 120, lactic acid of 3.5, and hgb of 6.5. Family has decided to transition to comfort focused care. PMT consulted to assist with end of life care.  Clinical Assessment and Goals of Care: I have reviewed medical records including EPIC notes, labs and imaging, received report from RN, assessed the patient and then met with patients daughter and spouse  to discuss diagnosis prognosis, GOC, EOL wishes, disposition and options.  I introduced Palliative Medicine as specialized medical care for people living with serious illness. It focuses on providing relief from the symptoms and stress of a serious illness. The goal is to improve quality of life for both the patient and the family.  All family agrees they would like to focus on promoting patient's comfort. They understand that comfort focused care is being provided and we are avoiding in other interventions that are not necessary to promote comfort and may cause discomfort. We discuss measures for symptom management.   Family is interested in hospice facility - we discuss hospice philosophy of care.  Emotional support and therapeutic listening provided.   Questions and concerns were addressed. The family was encouraged to call with questions or concerns.   Discussed above with RN, case manager, and hospice liaison.  Primary Decision Maker NEXT OF KIN - spouse - Dalayza Zambrana    SUMMARY OF  RECOMMENDATIONS   - Likely d/c to hospice facility today - [facility] - orders adjusted for comfort care - add morphine boluses PRN, add robinul  Code Status/Advance Care Planning:  DNR  Additional Recommendations (Limitations, Scope, Preferences):  Full Comfort Care  Psycho-social/Spiritual:   Desire for further Chaplaincy support:no  Additional Recommendations: Education on Hospice  Prognosis:   Hours - Days  Discharge Planning: Hospice facility      Primary Diagnoses: Present on Admission: . Chronic atrial fibrillation ([place]) . Chronic combined systolic and diastolic CHF (congestive heart failure) ([place]) . Cirrhosis of liver ([place]) . DM (diabetes mellitus) type II uncontrolled, periph vascular disorder ([place]) . Essential hypertension . Acute hepatic encephalopathy   I have reviewed the medical record, interviewed the patient and family, and examined the patient. The following aspects are pertinent.  Past Medical History:  Diagnosis Date  . Allergy   . Anemia    low iron hx  . Arthritis   . Breast cancer ([place]) 2014   a. 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 Cataract    removed ou  . Chronic combined systolic and diastolic CHF (congestive heart failure) ([place])    a. 08/2012 Echo: EF 55-60%, no rwma, mod MR;  b. 07/2014 Echo: EF 45-50%, distal antsept HK, mod TR/MR, mildly bil-atrial enlargement.  . Clear Cell Ovarian Cancer    a. 06/2014 s/p TAH/BSO debulking/lymph node dissection;  b. Now on chemo.  . Depression   . Diabetes mellitus   . DJD (degenerative joint disease) of lumbar spine   . DVT (  deep venous thrombosis) ([facility])    hx of on HRT left leg ~2006  . Gallstones   . GERD (gastroesophageal reflux disease)   . Hyperlipidemia   . Hypertension   . Hypothyroidism   . Liver disease, chronic, with cirrhosis ([place])    ? autoimmune  . OSA on  CPAP    cpap 4.5 setting  . Paroxysmal atrial fibrillation ([facility])    a. CHA2DS2VASc = 4-->coumadin.  [name] Physical deconditioning   . PPD positive, treated    rx inh   . Sleep apnea    Social History   Socioeconomic History  . Marital status: Married    Spouse name: Not on file  . Number of children: 2  . Years of education: Not on file  . Highest education level: Not on file  Occupational History  . Occupation: disability    Employer: [employer]  Tobacco Use  . Smoking status: Former Smoker    Packs/day: 1.00    Years: 1.00    Pack years: 1.00  . Smokeless tobacco: Never Used  Vaping Use  . Vaping Use: Never used  Substance and Sexual Activity  . Alcohol use: No  . Drug use: No    Comment: quit age 78 only smoked as a teen 1 year  . Sexual activity: Not Currently  Other Topics Concern  . 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    Social Determinants of Health   Financial Resource Strain:   . Difficulty of Paying Living Expenses:   Food Insecurity:   . Worried About Charity fundraiser in the Last Year:   . Arboriculturist in the Last Year:   Transportation Needs:   . Film/video editor (Medical):   [name] Lack of Transportation (Non-Medical):   Physical Activity:   . Days of Exercise per Week:   . Minutes of Exercise per Session:   Stress:   . Feeling of Stress :   Social Connections:   . Frequency of Communication with Friends and Family:   . Frequency of Social Gatherings with Friends and Family:   . Attends Religious Services:   . Active Member of Clubs or Organizations:   . Attends Archivist Meetings:   [name] Marital Status:    Family History  Problem Relation Age of Onset  . Diabetes Mother   . Hypertension Mother   . Arthritis Mother   . Heart disease Mother   .  Heart failure Mother   . Other Mother        benign breast mass  . Stroke Father   . Heart disease Father   . Dementia Father   . Diabetes Paternal Grandmother   . Colon cancer Paternal Grandfather        dx. 70s-80s  . Colon cancer Maternal Grandfather   . Other Daughter        potentially has had negative BRCA testing  . Other Daughter        hysterectomy; potentially has had negative genetic testing  . Rectal cancer Neg Hx   . Esophageal cancer Neg Hx   . Liver cancer Neg Hx    Scheduled Meds: Continuous Infusions: . morphine 2 mg/hr (  11/15/19 1022)   PRN Meds:.acetaminophen **OR** acetaminophen, antiseptic oral rinse, glycopyrrolate **OR** glycopyrrolate **OR** glycopyrrolate, haloperidol lactate, LORazepam, morphine, polyvinyl alcohol Allergies  Allergen Reactions  . Olmesartan Medoxomil Cough  . Adhesive [Tape] Rash  . Tetracycline Hcl Other (See Comments)  . Venlafaxine Other (See Comments)    severe dry mouth   Review of Systems  Unable to perform ROS: Patient unresponsive    Physical Exam Constitutional:      General: She is not in acute distress.    Comments: unrseponsive  Pulmonary:     Effort: Pulmonary effort is normal.  Musculoskeletal:     Comments: Warm extremities     Vital Signs: BP 130/60   Pulse 96   Temp 97.9 F (36.6 C) (Axillary)   Resp (!) 28   Ht '5\' 5"'  (1.651 m)   Wt 71.5 kg   SpO2 93%   BMI 26.23 kg/m  Pain Scale: 0-10 POSS *See Group Information*: 1-Acceptable,Awake and alert Pain Score: 0-No pain   SpO2: SpO2: 93 % O2 Device:SpO2: 93 % O2 Flow Rate: .O2 Flow Rate (L/min): 2 L/min  IO: Intake/output summary:   Intake/Output Summary (Last 24 hours) at 11/15/2019 1056 Last data filed at 11/15/2019 0000 Gross per 24 hour  Intake 0 ml  Output 500 ml  Net -500 ml    LBM: Last BM Date: 11/14/19 Baseline Weight: Weight: 71.5 kg Most recent weight: Weight: 71.5 kg     Palliative Assessment/Data: PPS 10%    Time Total: 50  minutes Greater than 50%  of this time was spent counseling and coordinating care related to the above assessment and plan.  Juel Burrow, DNP, AGNP-C Palliative Medicine Team [phone] Pager: [phone]

## 2019-11-15 NOTE — TOC Transition Note (Signed)
Transition of Care [facility]) - CM/SW Discharge Note   Patient Details  Name: [name] MRN: [id number] Date of Birth: [birth year]  [facility] CM/SW Contact:  Curlene Labrum, RN Phone Number: 11/15/2019, 12:33 PM   Clinical Narrative:    Case Management spoke with Webb Silversmith and the patient has an available bed [facility].  The primary nurse is to call and give report to [phone].  [facility] was called and transport was arranged for 1330 today and one family member is allowed to ride with the patient for transport.   Final next level of care: [place] Barriers to Discharge: Continued Medical Work up (referral made to [facility])   Patient Goals and CMS Choice Patient states their goals for this hospitalization and ongoing recovery are:: The family would like the patient to be transferred to an inpatient hospice facility. CMS Medicare.gov Compare Post Acute Care list provided to:: Patient Represenative (must comment) (patient's husband and daughter) Choice offered to / list presented to : Spouse  Discharge Placement                       Discharge Plan and Services In-house Referral: Hospice / Palliative Care Discharge Planning Services: CM Consult Post Acute Care Choice: Hospice                               Social Determinants of Health (SDOH) Interventions     Readmission Risk Interventions Readmission Risk Prevention Plan 11/15/2019  Transportation Screening Complete  Medication Review Press photographer) Complete  PCP or Specialist appointment within 3-5 days of discharge Complete  HRI or Home Care Consult Complete  SW Recovery Care/Counseling Consult Complete  Palliative Care Screening Complete  Skilled Nursing Facility Complete  Some recent data might be hidden

## 2019-11-16 ENCOUNTER — Telehealth: Payer: Self-pay

## 2019-11-16 LAB — BPAM RBC
Blood Product Expiration Date: 202108042359
Blood Product Expiration Date: 202108052359
ISSUE DATE / TIME: 202107042216
ISSUE DATE / TIME: 202107060638
Unit Type and Rh: 5100
Unit Type and Rh: 5100

## 2019-11-16 LAB — TYPE AND SCREEN
ABO/RH(D): O POS
Antibody Screen: NEGATIVE
Unit division: 0
Unit division: 0

## 2019-11-17 ENCOUNTER — Telehealth: Payer: Self-pay | Admitting: Family Medicine

## 2019-11-17 NOTE — Telephone Encounter (Signed)
Caller: Janelle ([facility]) Call back phone number: [phone]  Angus Palms is calling to let us know [name] has passed away.

## 2019-11-17 NOTE — Telephone Encounter (Signed)
Can you change patient's chart to deceased please?

## 2019-11-17 NOTE — Telephone Encounter (Signed)
noted 

## 2019-11-17 NOTE — Telephone Encounter (Signed)
FYI

## 2019-11-18 ENCOUNTER — Ambulatory Visit: Payer: Medicare Other | Admitting: Oncology

## 2019-11-18 ENCOUNTER — Other Ambulatory Visit: Payer: Medicare Other

## 2019-11-28 ENCOUNTER — Other Ambulatory Visit: Payer: Self-pay | Admitting: Gastroenterology

## 2019-12-11 NOTE — Telephone Encounter (Signed)
Spoke with patient's husband. He confirms that patient is in hospice and states she received a blood transfusion yesterday.

## 2019-12-11 NOTE — Telephone Encounter (Signed)
-----   Message from Ann Held, DO sent at November 21, 2019 12:17 PM EDT ----- Regarding: FW: I believe she is in patient hospice. please just make sure she is not home and needs ER. ----- Message ----- From: Interface, Lab In [place] Sent: 11/15/2019  10:27 PM EDT To: Ann Held, DO

## 2019-12-11 DEATH — deceased

## 2020-01-30 ENCOUNTER — Ambulatory Visit (HOSPITAL_COMMUNITY): Payer: Medicare Other
